# Patient Record
Sex: Female | Born: 1955
Health system: Southern US, Community
[De-identification: ages and names within clinical notes are randomized; demographics above are authoritative.]

## PROBLEM LIST (undated history)

## (undated) ENCOUNTER — Inpatient Hospital Stay: Admission: AD | Payer: Medicare Other | Source: Ambulatory Visit | Admitting: Pulmonary Disease

## (undated) DIAGNOSIS — M8580 Other specified disorders of bone density and structure, unspecified site: Secondary | ICD-10-CM

## (undated) DIAGNOSIS — K0889 Other specified disorders of teeth and supporting structures: Secondary | ICD-10-CM

## (undated) DIAGNOSIS — J189 Pneumonia, unspecified organism: Secondary | ICD-10-CM

## (undated) DIAGNOSIS — J45901 Unspecified asthma with (acute) exacerbation: Secondary | ICD-10-CM

## (undated) DIAGNOSIS — Z21 Asymptomatic human immunodeficiency virus [HIV] infection status: Secondary | ICD-10-CM

## (undated) DIAGNOSIS — I4892 Unspecified atrial flutter: Secondary | ICD-10-CM

## (undated) DIAGNOSIS — E785 Hyperlipidemia, unspecified: Secondary | ICD-10-CM

## (undated) DIAGNOSIS — R5382 Chronic fatigue, unspecified: Secondary | ICD-10-CM

## (undated) DIAGNOSIS — I2510 Atherosclerotic heart disease of native coronary artery without angina pectoris: Secondary | ICD-10-CM

## (undated) DIAGNOSIS — R0600 Dyspnea, unspecified: Secondary | ICD-10-CM

## (undated) DIAGNOSIS — M545 Low back pain, unspecified: Secondary | ICD-10-CM

## (undated) DIAGNOSIS — M1990 Unspecified osteoarthritis, unspecified site: Secondary | ICD-10-CM

## (undated) DIAGNOSIS — K219 Gastro-esophageal reflux disease without esophagitis: Secondary | ICD-10-CM

## (undated) DIAGNOSIS — E559 Vitamin D deficiency, unspecified: Secondary | ICD-10-CM

## (undated) DIAGNOSIS — Z923 Personal history of irradiation: Secondary | ICD-10-CM

## (undated) DIAGNOSIS — D649 Anemia, unspecified: Secondary | ICD-10-CM

## (undated) DIAGNOSIS — R633 Feeding difficulties: Secondary | ICD-10-CM

## (undated) DIAGNOSIS — G9332 Myalgic encephalomyelitis/chronic fatigue syndrome: Secondary | ICD-10-CM

## (undated) DIAGNOSIS — I4891 Unspecified atrial fibrillation: Secondary | ICD-10-CM

## (undated) DIAGNOSIS — E881 Lipodystrophy, not elsewhere classified: Secondary | ICD-10-CM

## (undated) DIAGNOSIS — B20 Human immunodeficiency virus [HIV] disease: Secondary | ICD-10-CM

## (undated) DIAGNOSIS — F419 Anxiety disorder, unspecified: Secondary | ICD-10-CM

## (undated) DIAGNOSIS — N281 Cyst of kidney, acquired: Secondary | ICD-10-CM

## (undated) DIAGNOSIS — R002 Palpitations: Secondary | ICD-10-CM

## (undated) DIAGNOSIS — G4730 Sleep apnea, unspecified: Secondary | ICD-10-CM

## (undated) DIAGNOSIS — R140 Abdominal distension (gaseous): Secondary | ICD-10-CM

## (undated) DIAGNOSIS — R011 Cardiac murmur, unspecified: Secondary | ICD-10-CM

## (undated) DIAGNOSIS — Z9289 Personal history of other medical treatment: Secondary | ICD-10-CM

## (undated) DIAGNOSIS — J45909 Unspecified asthma, uncomplicated: Secondary | ICD-10-CM

## (undated) DIAGNOSIS — E042 Nontoxic multinodular goiter: Secondary | ICD-10-CM

## (undated) DIAGNOSIS — I10 Essential (primary) hypertension: Secondary | ICD-10-CM

## (undated) DIAGNOSIS — J449 Chronic obstructive pulmonary disease, unspecified: Secondary | ICD-10-CM

## (undated) DIAGNOSIS — R600 Localized edema: Secondary | ICD-10-CM

## (undated) DIAGNOSIS — G8929 Other chronic pain: Secondary | ICD-10-CM

## (undated) DIAGNOSIS — Z86711 Personal history of pulmonary embolism: Secondary | ICD-10-CM

## (undated) DIAGNOSIS — E538 Deficiency of other specified B group vitamins: Secondary | ICD-10-CM

## (undated) DIAGNOSIS — B029 Zoster without complications: Secondary | ICD-10-CM

## (undated) HISTORY — DX: Myalgic encephalomyelitis/chronic fatigue syndrome: G93.32

## (undated) HISTORY — PX: TONSILLECTOMY AND ADENOIDECTOMY: SUR1326

## (undated) HISTORY — DX: Other specified disorders of bone density and structure, unspecified site: M85.80

## (undated) HISTORY — DX: Vitamin D deficiency, unspecified: E55.9

## (undated) HISTORY — DX: Palpitations: R00.2

## (undated) HISTORY — DX: Unspecified asthma, uncomplicated: J45.909

## (undated) HISTORY — PX: ABDOMINAL HYSTERECTOMY: SHX81

## (undated) HISTORY — DX: Zoster without complications: B02.9

## (undated) HISTORY — DX: Chronic obstructive pulmonary disease, unspecified: J44.9

## (undated) HISTORY — DX: Deficiency of other specified B group vitamins: E53.8

## (undated) HISTORY — DX: Unspecified atrial fibrillation: I48.91

## (undated) HISTORY — DX: Feeding difficulties: R63.3

## (undated) HISTORY — DX: Localized edema: R60.0

## (undated) HISTORY — PX: BRAIN SURGERY: SHX531

## (undated) HISTORY — DX: Other specified disorders of teeth and supporting structures: K08.89

## (undated) HISTORY — PX: CARDIAC CATHETERIZATION: SHX172

## (undated) HISTORY — DX: Chronic fatigue, unspecified: R53.82

## (undated) HISTORY — DX: Unspecified atrial flutter: I48.92

## (undated) HISTORY — DX: Atherosclerotic heart disease of native coronary artery without angina pectoris: I25.10

## (undated) HISTORY — DX: Personal history of pulmonary embolism: Z86.711

## (undated) HISTORY — DX: Abdominal distension (gaseous): R14.0

## (undated) HISTORY — DX: Dyspnea, unspecified: R06.00

---
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
## 2011-08-25 DIAGNOSIS — I2510 Atherosclerotic heart disease of native coronary artery without angina pectoris: Secondary | ICD-10-CM

## 2011-08-25 HISTORY — DX: Atherosclerotic heart disease of native coronary artery without angina pectoris: I25.10

## 2015-07-09 ENCOUNTER — Emergency Department (HOSPITAL_COMMUNITY): Payer: Medicare Other

## 2015-07-09 ENCOUNTER — Observation Stay (HOSPITAL_COMMUNITY): Payer: Medicare Other

## 2015-07-09 ENCOUNTER — Observation Stay (HOSPITAL_COMMUNITY)
Admission: EM | Admit: 2015-07-09 | Discharge: 2015-07-11 | Disposition: A | Discharge status: Home / Self Care | Payer: Medicare Other | Attending: Internal Medicine | Admitting: Internal Medicine

## 2015-07-09 ENCOUNTER — Encounter (HOSPITAL_COMMUNITY): Payer: Self-pay

## 2015-07-09 ENCOUNTER — Observation Stay (HOSPITAL_BASED_OUTPATIENT_CLINIC_OR_DEPARTMENT_OTHER): Payer: Medicare Other

## 2015-07-09 DIAGNOSIS — J45901 Unspecified asthma with (acute) exacerbation: Secondary | ICD-10-CM | POA: Diagnosis not present

## 2015-07-09 DIAGNOSIS — Z21 Asymptomatic human immunodeficiency virus [HIV] infection status: Secondary | ICD-10-CM | POA: Diagnosis not present

## 2015-07-09 DIAGNOSIS — R0600 Dyspnea, unspecified: Secondary | ICD-10-CM

## 2015-07-09 DIAGNOSIS — R000 Tachycardia, unspecified: Secondary | ICD-10-CM

## 2015-07-09 DIAGNOSIS — E785 Hyperlipidemia, unspecified: Secondary | ICD-10-CM

## 2015-07-09 DIAGNOSIS — J189 Pneumonia, unspecified organism: Secondary | ICD-10-CM

## 2015-07-09 DIAGNOSIS — I10 Essential (primary) hypertension: Secondary | ICD-10-CM

## 2015-07-09 DIAGNOSIS — J4551 Severe persistent asthma with (acute) exacerbation: Secondary | ICD-10-CM | POA: Diagnosis present

## 2015-07-09 DIAGNOSIS — J449 Chronic obstructive pulmonary disease, unspecified: Principal | ICD-10-CM | POA: Insufficient documentation

## 2015-07-09 DIAGNOSIS — J4541 Moderate persistent asthma with (acute) exacerbation: Secondary | ICD-10-CM | POA: Diagnosis not present

## 2015-07-09 DIAGNOSIS — J209 Acute bronchitis, unspecified: Secondary | ICD-10-CM

## 2015-07-09 DIAGNOSIS — K219 Gastro-esophageal reflux disease without esophagitis: Secondary | ICD-10-CM

## 2015-07-09 DIAGNOSIS — Z79899 Other long term (current) drug therapy: Secondary | ICD-10-CM | POA: Insufficient documentation

## 2015-07-09 DIAGNOSIS — R918 Other nonspecific abnormal finding of lung field: Secondary | ICD-10-CM | POA: Insufficient documentation

## 2015-07-09 DIAGNOSIS — Z86711 Personal history of pulmonary embolism: Secondary | ICD-10-CM | POA: Insufficient documentation

## 2015-07-09 DIAGNOSIS — R9389 Abnormal findings on diagnostic imaging of other specified body structures: Secondary | ICD-10-CM

## 2015-07-09 DIAGNOSIS — B20 Human immunodeficiency virus [HIV] disease: Secondary | ICD-10-CM | POA: Diagnosis present

## 2015-07-09 DIAGNOSIS — R0902 Hypoxemia: Secondary | ICD-10-CM

## 2015-07-09 HISTORY — DX: Other chronic pain: G89.29

## 2015-07-09 HISTORY — DX: Pneumonia, unspecified organism: J18.9

## 2015-07-09 HISTORY — DX: Essential (primary) hypertension: I10

## 2015-07-09 HISTORY — DX: Personal history of pulmonary embolism: Z86.711

## 2015-07-09 HISTORY — DX: Anxiety disorder, unspecified: F41.9

## 2015-07-09 HISTORY — DX: Low back pain, unspecified: M54.50

## 2015-07-09 HISTORY — DX: Low back pain: M54.5

## 2015-07-09 HISTORY — DX: Cardiac murmur, unspecified: R01.1

## 2015-07-09 HISTORY — DX: Hyperlipidemia, unspecified: E78.5

## 2015-07-09 HISTORY — DX: Asymptomatic human immunodeficiency virus (hiv) infection status: Z21

## 2015-07-09 HISTORY — DX: Gastro-esophageal reflux disease without esophagitis: K21.9

## 2015-07-09 HISTORY — DX: Unspecified osteoarthritis, unspecified site: M19.90

## 2015-07-09 HISTORY — DX: Unspecified asthma with (acute) exacerbation: J45.901

## 2015-07-09 HISTORY — DX: Anemia, unspecified: D64.9

## 2015-07-09 HISTORY — DX: Cyst of kidney, acquired: N28.1

## 2015-07-09 HISTORY — DX: Human immunodeficiency virus (HIV) disease: B20

## 2015-07-09 HISTORY — DX: Sleep apnea, unspecified: G47.30

## 2015-07-09 HISTORY — DX: Personal history of other medical treatment: Z92.89

## 2015-07-09 LAB — CBC WITH DIFFERENTIAL/PLATELET
Basophils Absolute: 0 10*3/uL (ref 0.0–0.1)
Basophils Relative: 0 %
EOS ABS: 0.2 10*3/uL (ref 0.0–0.7)
EOS PCT: 2 %
HCT: 40.2 % (ref 36.0–46.0)
Hemoglobin: 12.9 g/dL (ref 12.0–15.0)
LYMPHS ABS: 1.9 10*3/uL (ref 0.7–4.0)
LYMPHS PCT: 25 %
MCH: 29.1 pg (ref 26.0–34.0)
MCHC: 32.1 g/dL (ref 30.0–36.0)
MCV: 90.7 fL (ref 78.0–100.0)
MONO ABS: 0.5 10*3/uL (ref 0.1–1.0)
Monocytes Relative: 6 %
Neutro Abs: 5.2 10*3/uL (ref 1.7–7.7)
Neutrophils Relative %: 67 %
PLATELETS: 175 10*3/uL (ref 150–400)
RBC: 4.43 MIL/uL (ref 3.87–5.11)
RDW: 13.5 % (ref 11.5–15.5)
WBC: 7.7 10*3/uL (ref 4.0–10.5)

## 2015-07-09 LAB — BASIC METABOLIC PANEL
Anion gap: 7 (ref 5–15)
BUN: 14 mg/dL (ref 6–20)
CALCIUM: 10.3 mg/dL (ref 8.9–10.3)
CO2: 31 mmol/L (ref 22–32)
CREATININE: 1.02 mg/dL — AB (ref 0.44–1.00)
Chloride: 100 mmol/L — ABNORMAL LOW (ref 101–111)
GFR calc Af Amer: >60 mL/min (ref >60)
GFR, EST NON AFRICAN AMERICAN: 58 mL/min — AB (ref >60)
GLUCOSE: 147 mg/dL — AB (ref 65–99)
Potassium: 3.5 mmol/L (ref 3.5–5.1)
SODIUM: 138 mmol/L (ref 135–145)

## 2015-07-09 LAB — PROCALCITONIN: Procalcitonin: <0.1 ng/mL

## 2015-07-09 LAB — LACTATE DEHYDROGENASE: LDH: 175 U/L (ref 98–192)

## 2015-07-09 MED ORDER — ACETAMINOPHEN 325 MG PO TABS
650.0000 mg | ORAL_TABLET | Freq: Four times a day (QID) | ORAL | Status: DC | PRN
  Filled 2015-07-09: qty 2

## 2015-07-09 MED ORDER — ALBUTEROL SULFATE (2.5 MG/3ML) 0.083% IN NEBU
5.0000 mg | INHALATION_SOLUTION | Freq: Once | RESPIRATORY_TRACT | Status: AC
  Administered 2015-07-09: 5 mg via RESPIRATORY_TRACT
  Filled 2015-07-09: qty 6

## 2015-07-09 MED ORDER — SODIUM CHLORIDE 0.9 % IV BOLUS (SEPSIS)
1000.0000 mL | Freq: Once | INTRAVENOUS | Status: AC
  Administered 2015-07-09: 1000 mL via INTRAVENOUS

## 2015-07-09 MED ORDER — RALTEGRAVIR POTASSIUM 400 MG PO TABS
400.0000 mg | ORAL_TABLET | Freq: Two times a day (BID) | ORAL | Status: DC
  Administered 2015-07-09 – 2015-07-11 (×4): 400 mg via ORAL
  Filled 2015-07-09 (×6): qty 1

## 2015-07-09 MED ORDER — ACETAMINOPHEN 650 MG RE SUPP: 650.0000 mg | Freq: Four times a day (QID) | RECTAL | Status: DC | PRN

## 2015-07-09 MED ORDER — ALBUTEROL SULFATE (2.5 MG/3ML) 0.083% IN NEBU: 2.5000 mg | INHALATION_SOLUTION | RESPIRATORY_TRACT | Status: DC | PRN

## 2015-07-09 MED ORDER — CEFTRIAXONE SODIUM 1 G IJ SOLR
1.0000 g | Freq: Once | INTRAMUSCULAR | Status: AC
  Administered 2015-07-09: 1 g via INTRAVENOUS
  Filled 2015-07-09: qty 10

## 2015-07-09 MED ORDER — ADULT MULTIVITAMIN W/MINERALS CH
1.0000 | ORAL_TABLET | Freq: Every day | ORAL | Status: DC
  Administered 2015-07-09 – 2015-07-11 (×3): 1 via ORAL
  Filled 2015-07-09 (×3): qty 1

## 2015-07-09 MED ORDER — DEXTROSE 5 % IV SOLN
500.0000 mg | INTRAVENOUS | Status: DC
  Administered 2015-07-10 – 2015-07-11 (×2): 500 mg via INTRAVENOUS
  Filled 2015-07-09 (×2): qty 500

## 2015-07-09 MED ORDER — IPRATROPIUM BROMIDE 0.02 % IN SOLN
0.5000 mg | Freq: Once | RESPIRATORY_TRACT | Status: AC
  Administered 2015-07-09: 0.5 mg via RESPIRATORY_TRACT
  Filled 2015-07-09: qty 2.5

## 2015-07-09 MED ORDER — ENOXAPARIN SODIUM 40 MG/0.4ML ~~LOC~~ SOLN
40.0000 mg | SUBCUTANEOUS | Status: DC
Start: 2015-07-09 — End: 2015-07-11
  Administered 2015-07-09 – 2015-07-11 (×3): 40 mg via SUBCUTANEOUS
  Filled 2015-07-09 (×3): qty 0.4

## 2015-07-09 MED ORDER — IOHEXOL 350 MG/ML SOLN: 100.0000 mL | Freq: Once | INTRAVENOUS | Status: DC | PRN

## 2015-07-09 MED ORDER — DEXTROSE 5 % IV SOLN
1.0000 g | INTRAVENOUS | Status: DC
  Administered 2015-07-10 – 2015-07-11 (×2): 1 g via INTRAVENOUS
  Filled 2015-07-09 (×2): qty 10

## 2015-07-09 MED ORDER — IPRATROPIUM-ALBUTEROL 0.5-2.5 (3) MG/3ML IN SOLN
3.0000 mL | RESPIRATORY_TRACT | Status: DC
  Filled 2015-07-09: qty 3

## 2015-07-09 MED ORDER — LOSARTAN POTASSIUM 50 MG PO TABS
100.0000 mg | ORAL_TABLET | Freq: Every day | ORAL | Status: DC
  Administered 2015-07-09 – 2015-07-11 (×3): 100 mg via ORAL
  Filled 2015-07-09 (×3): qty 2

## 2015-07-09 MED ORDER — RALTEGRAVIR POTASSIUM 400 MG PO TABS
400.0000 mg | ORAL_TABLET | Freq: Every day | ORAL | Status: DC
  Administered 2015-07-09: 400 mg via ORAL
  Filled 2015-07-09: qty 1

## 2015-07-09 MED ORDER — DARUNAVIR ETHANOLATE 800 MG PO TABS
800.0000 mg | ORAL_TABLET | Freq: Every day | ORAL | Status: DC
  Administered 2015-07-09 – 2015-07-11 (×3): 800 mg via ORAL
  Filled 2015-07-09 (×3): qty 1

## 2015-07-09 MED ORDER — ENSURE ENLIVE PO LIQD: 237.0000 mL | Freq: Two times a day (BID) | ORAL | Status: DC

## 2015-07-09 MED ORDER — PANTOPRAZOLE SODIUM 40 MG PO TBEC
40.0000 mg | DELAYED_RELEASE_TABLET | Freq: Every day | ORAL | Status: DC
  Administered 2015-07-09 – 2015-07-11 (×3): 40 mg via ORAL
  Filled 2015-07-09 (×3): qty 1

## 2015-07-09 MED ORDER — CETYLPYRIDINIUM CHLORIDE 0.05 % MT LIQD
7.0000 mL | Freq: Two times a day (BID) | OROMUCOSAL | Status: DC
  Administered 2015-07-09 – 2015-07-11 (×4): 7 mL via OROMUCOSAL

## 2015-07-09 MED ORDER — EZETIMIBE 10 MG PO TABS
10.0000 mg | ORAL_TABLET | Freq: Every day | ORAL | Status: DC
  Administered 2015-07-09 – 2015-07-11 (×3): 10 mg via ORAL
  Filled 2015-07-09 (×3): qty 1

## 2015-07-09 MED ORDER — SODIUM CHLORIDE 0.9 % IV SOLN
INTRAVENOUS | Status: AC
  Administered 2015-07-09: 21:00:00 via INTRAVENOUS

## 2015-07-09 MED ORDER — IPRATROPIUM-ALBUTEROL 0.5-2.5 (3) MG/3ML IN SOLN
3.0000 mL | RESPIRATORY_TRACT | Status: DC
  Administered 2015-07-09 – 2015-07-11 (×9): 3 mL via RESPIRATORY_TRACT
  Filled 2015-07-09 (×12): qty 3

## 2015-07-09 MED ORDER — DEXTROSE 5 % IV SOLN
500.0000 mg | Freq: Once | INTRAVENOUS | Status: AC
  Administered 2015-07-09: 500 mg via INTRAVENOUS
  Filled 2015-07-09: qty 500

## 2015-07-09 MED ORDER — DM-GUAIFENESIN ER 30-600 MG PO TB12
1.0000 | ORAL_TABLET | Freq: Two times a day (BID) | ORAL | Status: DC
  Administered 2015-07-09 – 2015-07-11 (×5): 1 via ORAL
  Filled 2015-07-09 (×5): qty 1

## 2015-07-09 MED ORDER — METHYLPREDNISOLONE SODIUM SUCC 125 MG IJ SOLR
125.0000 mg | Freq: Once | INTRAMUSCULAR | Status: AC
  Administered 2015-07-09: 125 mg via INTRAVENOUS
  Filled 2015-07-09: qty 2

## 2015-07-09 MED ORDER — RITONAVIR 100 MG PO TABS
100.0000 mg | ORAL_TABLET | Freq: Every day | ORAL | Status: DC
  Administered 2015-07-09 – 2015-07-11 (×3): 100 mg via ORAL
  Filled 2015-07-09 (×3): qty 1

## 2015-07-09 MED ORDER — ALPRAZOLAM 0.5 MG PO TABS
0.5000 mg | ORAL_TABLET | Freq: Every evening | ORAL | Status: DC | PRN
  Administered 2015-07-09 – 2015-07-10 (×2): 0.5 mg via ORAL
  Filled 2015-07-09 (×2): qty 1

## 2015-07-09 MED ORDER — IPRATROPIUM-ALBUTEROL 0.5-2.5 (3) MG/3ML IN SOLN: 3.0000 mL | Freq: Four times a day (QID) | RESPIRATORY_TRACT | Status: DC

## 2015-07-09 NOTE — Progress Notes (Signed)
*  Preliminary Results* Bilateral lower extremity venous duplex completed. Bilateral lower extremities are negative for deep vein thrombosis. There is no evidence of Baker's cyst bilaterally.  07/09/2015  Maudry Mayhew, RVT, RDCS, RDMS

## 2015-07-09 NOTE — ED Notes (Signed)
Pt c/o asthma, has been coughing up green stuff for several days, chest is tight, lungs very diminished and wheezing.

## 2015-07-09 NOTE — Care Management Note (Addendum)
Case Management Note  Patient Details  Name: Shannon [name] MRN: [id number] Date of Birth: 22-Aug-1956  Subjective/Objective:                  Date-07-09-15 Initial Assessment Spoke with patient at the bedside. Introduced self as Tourist information centre manager and explained role in discharge planning and how to be reached.  Verified patient lives at [address] apt [place] [address] Verified patient anticipates to go home alone, at time of discharge.  Patient has DME nebulizer she left in [place]. Expressed potential need for no other DME.  Patient denied  needing help with their medication. Patient has U[employer] Medicare and Medicaid from [place]. Patient takes the bus to MD appointments.  Verified patient has PCP none local. Follow up with [facility]. Patient states they currently receive [facility] services through no one.    Plan: CM will continue to follow for discharge planning and [facility] resources.   Carles Collet RN BSN CM [phone]   Action/Plan:  Referral made to [facility] for nebulizer and walker.   Expected Discharge Date:                  Expected Discharge Plan:  Home/Self Care  In-House Referral:     Discharge planning Services  CM Consult  Post Acute Care Choice:    Choice offered to:     DME Arranged:    DME Agency:     HH Arranged:    HH Agency:     Status of Service:  In process, will continue to follow  Medicare Important Message Given:    Date Medicare IM Given:    Medicare IM give by:    Date Additional Medicare IM Given:    Additional Medicare Important Message give by:     If discussed at [place] of Stay Meetings, dates discussed:    Additional Comments:  Carles Collet, RN 07/09/2015, 1:51 PM

## 2015-07-09 NOTE — H&P (Signed)
Date: 07/09/2015               Patient Name:  Shannon [name] MRN: [id number]  DOB: October 05, 1955 Age / Sex: 59 y.o., female   PCP: No primary care provider on file.           Medical Service: Internal Medicine Teaching Service         Attending Physician: Dr. Aldine Contes, MD    First Contact: Dr. Genene Churn, MD Pager: [phone]  Second Contact: Dr. Marlowe Sax, MD Pager: [phone]       After Hours (After 5p/  First Contact Pager: [phone]  weekends / holidays): Second Contact Pager: [phone]    Most Recent Discharge Date:     Chief Complaint:  Chief Complaint  Patient presents with  . Shortness of Breath  . Asthma       History of Present Illness:  Shannon [name] is a 59 y.o. femalewho has a PMH of well controlled HIV on ART (12/2014: CD4 506/viral load <20 diagnosed in [place] ~ 17 yrs ago), asthma, PE, HTN, HLD, "mild diastolic dysfunction".    She presents to the ED with worsening dyspnea for the past couple of weeks.  She moved from [place] about 2 months ago to avoid the humidity which she said worsens her asthma.  She also has family in [place].  She has never been a smoker but was diagnosed with asthma in her 18s and using proair.  She ran out of her nebulizer meds and has been using her proair about 4-6 times a day.  Prior to a couple of weeks ago she was only using the proair twice daily.  She also notes wheezing but denies chest tightness.  She reports sick contacts and states her asthma tends to be triggered by cold air.  She also endorses some productive cough of greenish sputum, fever/chills, and increased blood pressure.  She had one episode of chest pain last PM that lasted a few seconds which she attributed to "gas."  She reports having a LHC last year that was normal.  She also provided records from a cardiologist with a TTE done in May 2016 that showed a normal LVEF with "mild diastolic dysfunction."  Left and right atrium moderately dilated.  PA pressures were normal.    In the  ED, pt was hypoxic in the 80s on RA and was tachycardic.  She was not febrile and without leukocytosis.  She has decreased air movement and expiratory wheezing throughout.  EKG showed sinus tachycardia.  CXR showed a possible RML opacity representing atx and/or acute infiltrate.  But felt opacity felt to be mostly atx.  She was admitted for CAP and given duonebs, solumedrol and started on ceftriaxone and azithromycin for CAP.     Meds: Current Facility-Administered Medications  Medication Dose Route Frequency Provider Last Rate Last Dose  . acetaminophen (TYLENOL) tablet 650 mg  650 mg Oral Q6H PRN Jones Bales, MD       Or  . acetaminophen (TYLENOL) suppository 650 mg  650 mg Rectal Q6H PRN Jones Bales, MD      . albuterol (PROVENTIL) (2.5 MG/3ML) 0.083% nebulizer solution 2.5 mg  2.5 mg Nebulization Q2H PRN Jones Bales, MD      . Darunavir Ethanolate (PREZISTA) tablet 800 mg  800 mg Oral Q breakfast Jones Bales, MD      . dextromethorphan-guaiFENesin (MUCINEX DM) 30-600 MG per 12 hr tablet 1 tablet  1 tablet Oral BID [place] Parco S  Gordy Levan, MD      . enoxaparin (LOVENOX) injection 40 mg  40 mg Subcutaneous Q24H Jones Bales, MD      . ezetimibe (ZETIA) tablet 10 mg  10 mg Oral q1800 Jones Bales, MD      . ipratropium-albuterol (DUONEB) 0.5-2.5 (3) MG/3ML nebulizer solution 3 mL  3 mL Nebulization Q4H Jones Bales, MD      . losartan (COZAAR) tablet 100 mg  100 mg Oral Daily Jones Bales, MD      . multivitamin with minerals tablet 1 tablet  1 tablet Oral Daily Jones Bales, MD      . pantoprazole (PROTONIX) EC tablet 40 mg  40 mg Oral Daily Jones Bales, MD      . raltegravir (ISENTRESS) tablet 400 mg  400 mg Oral Daily Jones Bales, MD      . ritonavir (NORVIR) tablet 100 mg  100 mg Oral Q breakfast Jones Bales, MD        Prescriptions prior to admission  Medication Sig Dispense Refill Last Dose  . albuterol (PROAIR HFA) 108 (90 BASE) MCG/ACT  inhaler Inhale 2 puffs into the lungs every 6 (six) hours as needed for wheezing or shortness of breath.   07/08/2015 at Unknown time  . albuterol (PROVENTIL) (2.5 MG/3ML) 0.083% nebulizer solution Take 2.5 mg by nebulization every 6 (six) hours as needed for wheezing or shortness of breath.   unk  . Coenzyme Q10 (COQ10 PO) Take 1 tablet by mouth daily.   07/08/2015 at Unknown time  . Cyanocobalamin (VITAMIN B-12 IJ) Inject 1,000 mcg as directed every 30 (thirty) days.   9/16  . Darunavir Ethanolate (PREZISTA) 800 MG tablet Take 800 mg by mouth daily with breakfast.   07/08/2015 at Unknown time  . ezetimibe (ZETIA) 10 MG tablet Take 10 mg by mouth daily.   07/08/2015 at Unknown time  . losartan (COZAAR) 100 MG tablet Take 100 mg by mouth daily.   07/08/2015 at Unknown time  . Multiple Vitamin (MULTIVITAMIN WITH MINERALS) TABS tablet Take 1 tablet by mouth daily.   07/08/2015 at Unknown time  . Multiple Vitamins-Minerals (ZINC PO) Take 1 tablet by mouth daily.   07/08/2015 at Unknown time  . NIFEdipine (PROCARDIA) 10 MG capsule Take 10 mg by mouth daily.   07/08/2015 at Unknown time  . omeprazole (PRILOSEC) 40 MG capsule Take 40 mg by mouth daily.   07/08/2015 at Unknown time  . POTASSIUM PO Take 1 tablet by mouth daily.   07/08/2015 at Unknown time  . raltegravir (ISENTRESS) 400 MG tablet Take 400 mg by mouth daily.   07/08/2015 at Unknown time  . ritonavir (NORVIR) 100 MG TABS tablet Take 100 mg by mouth daily with breakfast.   07/08/2015 at Unknown time  . vitamin C (ASCORBIC ACID) 500 MG tablet Take 1,000 mg by mouth daily.   07/08/2015 at Unknown time    Allergies: Allergies as of 07/09/2015 - Review Complete 07/09/2015  Allergen Reaction Noted  . Ciprofloxacin Hives 07/09/2015    PMH: Past Medical History  Diagnosis Date  . HIV antibody positive ([place])   . Asthma   . Hypertension     PSH: Past Surgical History  Procedure Laterality Date  . Abdominal hysterectomy    . Cardiac  catheterization    . Brain surgery      tumor on brain    FH: History reviewed. No pertinent family history.  SH: Social History  Substance  Use Topics  . Smoking status: Never Smoker   . Smokeless tobacco: Not on file  . Alcohol Use: No    Review of Systems: Pertinent items are noted in HPI.  Physical Exam: BP 144/84 mmHg  Pulse 108  Temp(Src) 98.4 F (36.9 C) (Oral)  Resp 16  Ht 5' 3.5" (1.613 m)  Wt 210 lb (95.255 kg)  BMI 36.61 kg/m2  SpO2 94%  Physical Exam  Constitutional: She is oriented to person, place, and time and well-developed, well-nourished, and in no distress.  HENT:  Head: Normocephalic and atraumatic.  Eyes: Conjunctivae and EOM are normal.  Neck: Neck supple.  Cardiovascular: Regular rhythm.  Tachycardia present.  Exam reveals no gallop and no friction rub.   Murmur heard. Pulmonary/Chest: Effort normal. No accessory muscle usage. No tachypnea. No respiratory distress. She has decreased breath sounds. She has wheezes. She has no rhonchi. She has no rales.  Abdominal: Soft. Bowel sounds are normal. There is no tenderness.  Neurological: She is alert and oriented to person, place, and time.  Skin: Skin is warm and dry. No rash noted.  Psychiatric: Affect normal.    Lab results:  Basic Metabolic Panel:  Recent Labs  07/09/15 0655  NA 138  K 3.5  CL 100*  CO2 31  GLUCOSE 147*  BUN 14  CREATININE 1.02*  CALCIUM 10.3    Calcium/Magnesium/Phosphorus:  Recent Labs Lab 07/09/15 0655  CALCIUM 10.3    Liver Function Tests: No results for input(s): AST, ALT, ALKPHOS, BILITOT, PROT, ALBUMIN in the last 72 hours. No results for input(s): LIPASE, AMYLASE in the last 72 hours. No results for input(s): AMMONIA in the last 72 hours.  CBC: Lab Results  Component Value Date   WBC 7.7 07/09/2015   HGB 12.9 07/09/2015   HCT 40.2 07/09/2015   MCV 90.7 07/09/2015   PLT 175 07/09/2015    Lipase: No results found for: LIPASE  Lactic  Acid/Procalcitonin:  Recent Labs Lab 07/09/15 1005  PROCALCITON <0.10    Cardiac Enzymes: No results for input(s): TROPIPOC in the last 72 hours. No results found for: CKTOTAL, CKMB, CKMBINDEX, TROPONINI  BNP: No results for input(s): PROBNP in the last 72 hours.  D-Dimer: No results for input(s): DDIMER in the last 72 hours.  CBG: No results for input(s): GLUCAP in the last 72 hours.  Hemoglobin A1C: No results for input(s): HGBA1C in the last 72 hours.  Lipid Panel: No results for input(s): CHOL, HDL, LDLCALC, TRIG, CHOLHDL, LDLDIRECT in the last 72 hours.  Thyroid Function Tests: No results for input(s): TSH, T4TOTAL, FREET4, T3FREE, THYROIDAB in the last 72 hours.  Anemia Panel: No results for input(s): VITAMINB12, FOLATE, FERRITIN, TIBC, IRON, RETICCTPCT in the last 72 hours.  Coagulation: No results for input(s): LABPROT, INR in the last 72 hours.  Urine Drug Screen: Drugs of Abuse:  No results found for: LABOPIA, COCAINSCRNUR, LABBENZ, AMPHETMU, THCU, LABBARB  Alcohol Level: No results for input(s): ETH in the last 72 hours.  Urinalysis: No results found for: COLORURINE, APPEARANCEUR, LABSPEC, PHURINE, GLUCOSEU, HGBUR, BILIRUBINUR, KETONESUR, PROTEINUR, UROBILINOGEN, NITRITE, LEUKOCYTESUR  Imaging results:  Dg Chest 2 View  07/09/2015  CLINICAL DATA:  Cough and asthma. EXAM: CHEST - 2 VIEW COMPARISON:  None FINDINGS: The heart size and mediastinal contours are within normal limits. Opacity in the right middle lobe appears to be mostly atelectasis. However, adjacent opacity does suggests that this could also represent an acute infiltrate. There is no evidence of pulmonary edema, pneumothorax, nodule  or pleural fluid. The visualized skeletal structures are unremarkable. IMPRESSION: Right middle lobe opacity representing atelectasis and/or acute infiltrate. Electronically Signed   By: Aletta Edouard M.D.   On: 07/09/2015 08:07    EKG: EKG  Interpretation  Date/Time:  Tuesday July 09 2015 06:37:24 EST Ventricular Rate:  112 PR Interval:  171 QRS Duration: 72 QT Interval:  328 QTC Calculation: 448 R Axis:   -35 Text Interpretation:  Sinus tachycardia Probable left atrial enlargement Left axis deviation No old tracing to compare Confirmed by [facility]  MD, DAVID ([id number]) on 07/09/2015 6:40:03 AM   Antibiotics: Antibiotics Given (last 72 hours)    None      Anti-infectives    Start     Dose/Rate Route Frequency Ordered Stop   07/09/15 1200  Darunavir Ethanolate (PREZISTA) tablet 800 mg     800 mg Oral Daily with breakfast 07/09/15 1043     07/09/15 1200  raltegravir (ISENTRESS) tablet 400 mg     400 mg Oral Daily 07/09/15 1043     07/09/15 1200  ritonavir (NORVIR) tablet 100 mg     100 mg Oral Daily with breakfast 07/09/15 1043     07/09/15 0830  cefTRIAXone (ROCEPHIN) 1 g in dextrose 5 % 50 mL IVPB     1 g 100 mL/hr over 30 Minutes Intravenous  Once 07/09/15 0826 07/09/15 0948   07/09/15 0830  azithromycin (ZITHROMAX) 500 mg in dextrose 5 % 250 mL IVPB     500 mg 250 mL/hr over 60 Minutes Intravenous  Once 07/09/15 0826 07/09/15 1047     Consults:   Assessment & Plan by Problem: Principal Problem:   Dyspnea Active Problems:   History of pulmonary embolism   Asthma   HIV disease ([place])   Essential hypertension   Mild diastolic dysfunction  Acute dyspnea Pt reports increased SOB, cough, and greenish sputum production.  O2 sats in the ED were 83-84% on RA.  Does have a h/o PE and was on coumadin previously so acute PE cannot be excluded.  She also has other risk factors for PE including travel from [place] about 2 months ago.  Other possibilities include asthma exacerbation triggered by infection and she has been out of her nebulizer.  States she doesn't do well with prednisone and that it makes her hyper.  CXR reveals possible PNA but afebrile and no leukocytosis and could easily represent atx.  Wells score: 3  so moderate risk.  Does appear that the right leg is slightly larger than the left.  Also could be PCP PNA but doubt since previous CD4 count was 506.  She also had a recent TTE in May 2016 with an EF of 70% and "mild diastolic dysfunction."  She does not appear volume overloaded.  Also with mild aortic stenosis and mild LVH.  No regional wall motion abnormalities.  Normal PA pressures.   -check PCT -LE dopplers  -consider CTA chest given prior h/o PE -cont abx for now pending PCT  -duonebs q4h, albuterol q2h PRN -consider prednisone in AM (patient states it maker her hyper)-->received '125mg'$  solumedrol in ED  -O2 [place] to keep O2sat>92%   -continuous pulse oximetry -monitor on telemetry given tachycardia -check pulse ox with ambulation  -PT/OT -IS -ABG if decompensates  Asthma -per above   HIV disease Pt previous CD4/viral load in May 2016 was 506/<20. -CD4, viral load -cont ART -would like to connect and be followed by ID so will need referral upon d/c  Hypertension  Stable.  -cont home meds   GERD  Pt on chronic PPI at home. -protonix  Dyslipidemia  -cont statin therapy  FEN  Fluids-None Electrolytes-Replete PRN Nutrition- Heart healthy  VTE prophylaxis  lovenox '40mg'$  SQ qd  Disposition Disposition deferred at this time, awaiting improvement of current medical problems. Anticipated discharge in approximately 1-2 day(s).    Emergency Contact Contact Information    Name Relation Home Work [place] Niece [phone]        The patient does not have a current PCP (No primary care provider on file.) and does need an [facility] hospital follow-up appointment after discharge.  We will get her a follow-up appt in our clinic upon discharge.   Signed Jones Bales, MD PGY-3, Internal Medicine Teaching Service 07/09/2015, 1:14 PM

## 2015-07-09 NOTE — ED Notes (Signed)
Patient transported to X-ray 

## 2015-07-09 NOTE — Progress Notes (Signed)
Arrived to restart PIV for CT exam. Patient refused start and asked to speak with MD. Primary RN aware,.

## 2015-07-09 NOTE — ED Provider Notes (Signed)
CSN: [id number]     Arrival date & time 07/09/15  4098 History   First MD Initiated Contact with Patient 07/09/15 [phone]     Chief Complaint  Patient presents with  . Shortness of Breath  . Asthma     (Consider location/radiation/quality/duration/timing/severity/associated sxs/prior Treatment) HPI Comments: Patient with PMH of HIV, asthma, HTN, and COPD presents to the ED with a chief complaint of wheezing, SOB, and productive cough.  She states that her symptoms have been worsening for the past 3 weeks.  She states that she normally uses a nebulizer, but is new to the area and does not have one.  She has been compliant with her HIV meds, CD4 count >500, viral load undetectable (as of May 2016).  She denies any associated fevers, chills, chest pain.  She thinks that her symptoms are exacerbated by the weather.  She recently moved to [place] from [place], and does not have a PCP.  The history is provided by the patient. No language interpreter was used.    Past Medical History  Diagnosis Date  . HIV antibody positive ([place])   . Asthma   . Hypertension   . COPD (chronic obstructive pulmonary disease) [facility])    Past Surgical History  Procedure Laterality Date  . Abdominal hysterectomy    . Cardiac catheterization    . Brain surgery      tumor on brain   No family history on file. Social History  Substance Use Topics  . Smoking status: Never Smoker   . Smokeless tobacco: None  . Alcohol Use: No   OB History    No data available     Review of Systems  Constitutional: Negative for fever and chills.  Respiratory: Positive for cough and shortness of breath.   Cardiovascular: Negative for chest pain.  Gastrointestinal: Negative for nausea, vomiting, diarrhea and constipation.  Genitourinary: Negative for dysuria.  All other systems reviewed and are negative.     Allergies  Ciprofloxacin  Home Medications   Prior to Admission medications   Not on File   BP 167/98  mmHg  Pulse 107  Temp(Src) 98.3 F (36.8 C) (Oral)  Resp 16  Ht '5\' 3"'$  (1.6 m)  Wt 210 lb (95.255 kg)  BMI 37.21 kg/m2  SpO2 97% Physical Exam  Constitutional: She is oriented to person, place, and time. She appears well-developed and well-nourished.  HENT:  Head: Normocephalic and atraumatic.  Eyes: Conjunctivae and EOM are normal. Pupils are equal, round, and reactive to light.  Neck: Normal range of motion. Neck supple.  Cardiovascular: Regular rhythm.  Exam reveals no gallop and no friction rub.   No murmur heard. Mildly tachycardic  Pulmonary/Chest: Effort normal. No respiratory distress. She has wheezes. She has no rales. She exhibits no tenderness.  Mild wheezing, diminished lung sounds  Abdominal: Soft. Bowel sounds are normal. She exhibits no distension and no mass. There is no tenderness. There is no rebound and no guarding.  Musculoskeletal: Normal range of motion. She exhibits no edema or tenderness.  Neurological: She is alert and oriented to person, place, and time.  Skin: Skin is warm and dry.  Psychiatric: She has a normal mood and affect. Her behavior is normal. Judgment and thought content normal.  Nursing note and vitals reviewed.   ED Course  Procedures (including critical care time) Labs Review Results for orders placed or performed during the hospital encounter of 07/09/15  CBC with Differential/Platelet  Result Value Ref Range   WBC 7.7  4.0 - 10.5 K/uL   RBC 4.43 3.87 - 5.11 MIL/uL   Hemoglobin 12.9 12.0 - 15.0 g/dL   HCT 40.2 36.0 - 46.0 %   MCV 90.7 78.0 - 100.0 fL   MCH 29.1 26.0 - 34.0 pg   MCHC 32.1 30.0 - 36.0 g/dL   RDW 13.5 11.5 - 15.5 %   Platelets 175 150 - 400 K/uL   Neutrophils Relative % 67 %   Neutro Abs 5.2 1.7 - 7.7 K/uL   Lymphocytes Relative 25 %   Lymphs Abs 1.9 0.7 - 4.0 K/uL   Monocytes Relative 6 %   Monocytes Absolute 0.5 0.1 - 1.0 K/uL   Eosinophils Relative 2 %   Eosinophils Absolute 0.2 0.0 - 0.7 K/uL   Basophils  Relative 0 %   Basophils Absolute 0.0 0.0 - 0.1 K/uL  Basic metabolic panel  Result Value Ref Range   Sodium 138 135 - 145 mmol/L   Potassium 3.5 3.5 - 5.1 mmol/L   Chloride 100 (L) 101 - 111 mmol/L   CO2 31 22 - 32 mmol/L   Glucose, Bld 147 (H) 65 - 99 mg/dL   BUN 14 6 - 20 mg/dL   Creatinine, Ser 1.02 (H) 0.44 - 1.00 mg/dL   Calcium 10.3 8.9 - 10.3 mg/dL   GFR calc non Af Amer 58 (L) >60 mL/min   GFR calc Af Amer >60 >60 mL/min   Anion gap 7 5 - 15   Dg Chest 2 View  07/09/2015  CLINICAL DATA:  Cough and asthma. EXAM: CHEST - 2 VIEW COMPARISON:  None FINDINGS: The heart size and mediastinal contours are within normal limits. Opacity in the right middle lobe appears to be mostly atelectasis. However, adjacent opacity does suggests that this could also represent an acute infiltrate. There is no evidence of pulmonary edema, pneumothorax, nodule or pleural fluid. The visualized skeletal structures are unremarkable. IMPRESSION: Right middle lobe opacity representing atelectasis and/or acute infiltrate. Electronically Signed   By: Aletta Edouard M.D.   On: 07/09/2015 08:07    I have personally reviewed and evaluated these images and lab results as part of my medical decision-making.   EKG Interpretation   Date/Time:  Tuesday July 09 2015 06:37:24 EST Ventricular Rate:  112 PR Interval:  171 QRS Duration: 72 QT Interval:  328 QTC Calculation: 448 R Axis:   -35 Text Interpretation:  Sinus tachycardia Probable left atrial enlargement  Left axis deviation No old tracing to compare Confirmed by [facility]  MD,  DAVID ([id number]) on 07/09/2015 6:40:03 AM      MDM   Final diagnoses:  CAP (community acquired pneumonia)  Hypoxia    Patient with cough, asthma, COPD and HIV.  Will check labs and CXR.  Give breathing treatment and solumedrol.  Will reassess.  Chest x-ray remarkable for right middle lobe opacity. Consistent with pneumonia given cough and shortness of breath. Patient noted  to be hypoxic on room air. Oxygen saturation is 83-88%. Patient placed on 2 L nasal cannula. Patient will need to be admitted for pneumonia with hypoxia. Patient started on Rocephin and azithromycin for CAP. Blood cultures pending. Patient does not have a primary care provider.  Patient discussed with Dr. [name], who agrees with the plan.  Internal medicine to admit.   Montine Circle, PA-C 07/09/15 [place], MD 07/10/15 [phone]

## 2015-07-10 LAB — HEPATITIS PANEL, ACUTE
HCV Ab: <0.1 s/co ratio (ref 0.0–0.9)
HEP B C IGM: NEGATIVE
Hep A IgM: NEGATIVE
Hepatitis B Surface Ag: NEGATIVE

## 2015-07-10 LAB — T-HELPER CELLS (CD4) COUNT (NOT AT ARMC)
CD4 T CELL HELPER: 20 % — AB (ref 33–55)
CD4 T Cell Abs: 140 /uL — ABNORMAL LOW (ref 400–2700)

## 2015-07-10 LAB — BASIC METABOLIC PANEL
ANION GAP: 7 (ref 5–15)
BUN: 18 mg/dL (ref 6–20)
CHLORIDE: 102 mmol/L (ref 101–111)
CO2: 30 mmol/L (ref 22–32)
Calcium: 10.2 mg/dL (ref 8.9–10.3)
Creatinine, Ser: 1.07 mg/dL — ABNORMAL HIGH (ref 0.44–1.00)
GFR calc Af Amer: >60 mL/min (ref >60)
GFR, EST NON AFRICAN AMERICAN: 55 mL/min — AB (ref >60)
GLUCOSE: 172 mg/dL — AB (ref 65–99)
POTASSIUM: 4.3 mmol/L (ref 3.5–5.1)
Sodium: 139 mmol/L (ref 135–145)

## 2015-07-10 LAB — CBC WITH DIFFERENTIAL/PLATELET
BASOS ABS: 0 10*3/uL (ref 0.0–0.1)
Basophils Relative: 0 %
EOS PCT: 0 %
Eosinophils Absolute: 0 10*3/uL (ref 0.0–0.7)
HEMATOCRIT: 37.1 % (ref 36.0–46.0)
HEMOGLOBIN: 12.2 g/dL (ref 12.0–15.0)
LYMPHS ABS: 1.1 10*3/uL (ref 0.7–4.0)
LYMPHS PCT: 14 %
MCH: 29.9 pg (ref 26.0–34.0)
MCHC: 32.9 g/dL (ref 30.0–36.0)
MCV: 90.9 fL (ref 78.0–100.0)
Monocytes Absolute: 0.4 10*3/uL (ref 0.1–1.0)
Monocytes Relative: 5 %
NEUTROS ABS: 6.6 10*3/uL (ref 1.7–7.7)
NEUTROS PCT: 81 %
Platelets: 167 10*3/uL (ref 150–400)
RBC: 4.08 MIL/uL (ref 3.87–5.11)
RDW: 13.8 % (ref 11.5–15.5)
WBC: 8.1 10*3/uL (ref 4.0–10.5)

## 2015-07-10 LAB — HIV-1 RNA ULTRAQUANT REFLEX TO GENTYP+: HIV-1 RNA QUANT, LOG: UNDETERMINED {Log_copies}/mL

## 2015-07-10 LAB — EXPECTORATED SPUTUM ASSESSMENT W GRAM STAIN, RFLX TO RESP C

## 2015-07-10 LAB — PROTIME-INR
INR: 1 (ref 0.00–1.49)
Prothrombin Time: 13.4 seconds (ref 11.6–15.2)

## 2015-07-10 LAB — EXPECTORATED SPUTUM ASSESSMENT W REFEX TO RESP CULTURE

## 2015-07-10 LAB — STREP PNEUMONIAE URINARY ANTIGEN: STREP PNEUMO URINARY ANTIGEN: NEGATIVE

## 2015-07-10 NOTE — Progress Notes (Signed)
   07/10/15 1500  Clinical Encounter Type  Visited With Patient  Visit Type Initial  Referral From Care management  Spiritual Encounters  Spiritual Needs Emotional

## 2015-07-10 NOTE — Care Management Obs Status (Signed)
[facility] NOTIFICATION   Patient Details  Name: Shannon [name] MRN: [id number] Date of Birth: [birth year]   Medicare Observation Status Notification Given:  Yes    Carles Collet, RN 07/10/2015, 11:21 AM

## 2015-07-10 NOTE — Progress Notes (Signed)
SATURATION QUALIFICATIONS: (This note is used to comply with regulatory documentation for home oxygen)  Patient Saturations on Room Air at Rest = 90%  Patient Saturations on Room Air while Ambulating = 92%  Patient Saturations on 2 Liters of oxygen while Ambulating = 94%  Per [place] NT.

## 2015-07-10 NOTE — Progress Notes (Signed)
Subjective: Patient was seen and examined at bedside this am. She is still complaining of SOB and wheezing. Denies having any fevers or chills. No other complaints.   Objective: Vital signs in last 24 hours: Filed Vitals:   07/10/15 0528 07/10/15 0727 07/10/15 1143 07/10/15 1337  BP:    143/90  Pulse: 96   110  Temp:    98.2 F (36.8 C)  TempSrc:    Oral  Resp:    22  Height:      Weight:      SpO2: 90% 90% 90% 92%   Weight change:   Intake/Output Summary (Last 24 hours) at 07/10/15 1622 Last data filed at 07/10/15 1300  Gross per 24 hour  Intake    360 ml  Output      0 ml  Net    360 ml   Physical Exam: Constitutional: She is oriented to person, place, and time and well-developed, well-nourished, and in no distress.  Cardiovascular: Tachycardic. Regular rhythm. Distant heart sounds. Murmur present.   Pulmonary/Chest: Effort normal. No accessory muscle usage. No tachypnea. No respiratory distress. She has decreased breath sounds. No wheezing, rales, or rhonchi appreciated.  Abdominal: Soft. Bowel sounds are normal. There is no tenderness.  Skin: Skin is warm and dry.  Lab Results: Basic Metabolic Panel:  Recent Labs Lab 07/09/15 0655 07/10/15 0542  NA 138 139  K 3.5 4.3  CL 100* 102  CO2 31 30  GLUCOSE 147* 172*  BUN 14 18  CREATININE 1.02* 1.07*  CALCIUM 10.3 10.2   CBC:  Recent Labs Lab 07/09/15 0655 07/10/15 0542  WBC 7.7 8.1  NEUTROABS 5.2 6.6  HGB 12.9 12.2  HCT 40.2 37.1  MCV 90.7 90.9  PLT 175 167   Coagulation:  Recent Labs Lab 07/10/15 0911  LABPROT 13.4  INR 1.00   Micro Results: Recent Results (from the past 240 hour(s))  Blood culture (routine x 2)     Status: None (Preliminary result)   Collection Time: 07/09/15  8:53 AM  Result Value Ref Range Status   Specimen Description BLOOD RIGHT HAND  Final   Special Requests BOTTLES DRAWN AEROBIC AND ANAEROBIC 10ML  Final   Culture NO GROWTH 1 DAY  Final   Report Status PENDING   Incomplete  Blood culture (routine x 2)     Status: None (Preliminary result)   Collection Time: 07/09/15  9:01 AM  Result Value Ref Range Status   Specimen Description BLOOD RIGHT ANTECUBITAL  Final   Special Requests BOTTLES DRAWN AEROBIC AND ANAEROBIC 10ML  Final   Culture NO GROWTH 1 DAY  Final   Report Status PENDING  Incomplete  Culture, expectorated sputum-assessment     Status: None   Collection Time: 07/09/15 11:40 PM  Result Value Ref Range Status   Specimen Description SPUTUM  Final   Special Requests NONE  Final   Sputum evaluation   Final    THIS SPECIMEN IS ACCEPTABLE. RESPIRATORY CULTURE REPORT TO FOLLOW.   Report Status 07/10/2015 FINAL  Final   Studies/Results: Dg Chest 2 View  07/09/2015  CLINICAL DATA:  Cough and asthma. EXAM: CHEST - 2 VIEW COMPARISON:  None FINDINGS: The heart size and mediastinal contours are within normal limits. Opacity in the right middle lobe appears to be mostly atelectasis. However, adjacent opacity does suggests that this could also represent an acute infiltrate. There is no evidence of pulmonary edema, pneumothorax, nodule or pleural fluid. The visualized skeletal structures are unremarkable.  IMPRESSION: Right middle lobe opacity representing atelectasis and/or acute infiltrate. Electronically Signed   By: Aletta Edouard M.D.   On: 07/09/2015 08:07   Medications: I have reviewed the patient's current medications. Scheduled Meds: . antiseptic oral rinse  7 mL Mouth Rinse BID  . azithromycin (ZITHROMAX) 500 MG IVPB  500 mg Intravenous Q24H  . cefTRIAXone (ROCEPHIN)  IV  1 g Intravenous Q24H  . Darunavir Ethanolate  800 mg Oral Q breakfast  . dextromethorphan-guaiFENesin  1 tablet Oral BID  . enoxaparin (LOVENOX) injection  40 mg Subcutaneous Q24H  . ezetimibe  10 mg Oral q1800  . ipratropium-albuterol  3 mL Nebulization Q4H WA  . losartan  100 mg Oral Daily  . multivitamin with minerals  1 tablet Oral Daily  . pantoprazole  40 mg Oral  Daily  . raltegravir  400 mg Oral BID  . ritonavir  100 mg Oral Q breakfast   Continuous Infusions:  PRN Meds:.acetaminophen **OR** acetaminophen, albuterol, ALPRAZolam, iohexol Assessment/Plan: Principal Problem:   Dyspnea Active Problems:   History of pulmonary embolism   Asthma   HIV disease (HCC)   Essential hypertension   Mild diastolic dysfunction  Acute exacerbation of chronic asthma 2/2 acute bronchitis and exposure to cold air (known trigger for this patient) CTA of chest was ordered yesterday to r/o PE but patient refused the test. Lower extremity dopplers were negative for DVT. Her symptoms are likely from asthma exacerbation as patient did now have any more wheezing on exam today after been started on duonebs, albuterol and receiving Solumedrol in the ED yesterday. Upon ambulation, she is satting 92% on RA and 94% on 2L O2 via [place]. Patient is still tachycardic (P 88-110) likely due to treatment with duonebs/ albuterol. No leukocytosis. Blood culture showing no growth in 1 day.  -cont abx Ceftriaxone and Azithromycin  -duonebs q4h, albuterol q2h PRN -Mucinex-DM -O2  to keep O2sat>92%  -continuous pulse oximetry -monitor on telemetry given tachycardia -check pulse ox with ambulation  -PT/OT -ABG if decompensates -F/u final blood culture results  -F/u CBC in am   HIV disease Pt previous CD4/viral load in May 2016 was 506/<20. At present CD4 is 140 and HIV-1 RNA by PCR <20.  -cont ART -would like to connect and be followed by ID so will need referral upon d/c  Hypertension  Stable.  -cont home meds   GERD  Pt on chronic PPI at home. -protonix  Dyslipidemia  -cont statin therapy  FEN Fluids-None Electrolytes-Replete PRN Nutrition- Heart healthy  VTE prophylaxis  lovenox '40mg'$  SQ qd  Dispo: Disposition is deferred at this time, awaiting improvement of current medical problems.  Anticipated discharge in approximately 1-2 day(s).   The  patient does have a current PCP (No primary care provider on file.) and does need an [facility] hospital follow-up appointment after discharge.  The patient does not have transportation limitations that hinder transportation to clinic appointments.  .Services Needed at time of discharge: Y = Yes, Blank = No PT:   OT:   RN:   Equipment:   Other:     LOS: 1 day   Shela Leff, MD 07/10/2015, 4:22 PM

## 2015-07-10 NOTE — Progress Notes (Signed)
Patient was refusing getting another peripheral IV placed so that she could obtain a CT angiogram. The previous IV had infiltrated. I evaluated the patient, and I explained to her the risks of not identifying and treating a pulmonary embolism, including potentially fatal respiratory failure. She said, "She just didn't want to go that far. Can I get a nuclear test instead?" I described a V/Q scan, and she said she would be agreeable to that test. She denied any chest pain and reported breathing much better. She was satting 90% on room air.  Filed Vitals:   07/09/15 2056 07/09/15 2100 07/10/15 0526 07/10/15 0528  BP:  138/84 136/77   Pulse: 116 88 97 96  Temp:  98.6 F (37 C) 98 F (36.7 C)   TempSrc:  Oral Oral   Resp: '20 20 18   '$ Height:      Weight:      SpO2: 94% 94% 89% 90%   Cardiovascular: RRR. No murmurs, rubs or gallops Pulmonary: Scattered wheezes. Normal effort. Good air movement.  Assessment and Plan  Her vitals have much improved and she is no longer tachycardic, likely due to the diminished dosing of SABAs. In conjunction with her lack of chest pain, I think it is highly unlikely she is having a PE. - Consider V/Q scan

## 2015-07-10 NOTE — Progress Notes (Signed)
Patient has nicely refused to get walking O2 saturations with myself or Courtney NT multiple times r/t preferring to shower, eat, or talk on the phone. I will continue to attempt throughout rest of my shift.

## 2015-07-10 NOTE — Progress Notes (Signed)
Nutrition Brief Note  Patient identified on the Malnutrition Screening Tool (MST) Report  Wt Readings from Last 15 Encounters:  07/09/15 210 lb (95.255 kg)   Shannon [name] is a 59 y.o. femalewho has a PMH of well controlled HIV on ART (12/2014: CD4 506/viral load <20 diagnosed in [place] ~ 17 yrs ago), asthma, PE, HTN, HLD, "mild diastolic dysfunction".   Pt currently with good appetite; meal completion 75%. Pt is refusing Ensure supplements. RD will d/c.   Spoke with charge nurse, who reports pt is likely for d/c today.   Body mass index is 36.61 kg/(m^2). Patient meets criteria for obesity, class II based on current BMI.   Current diet order is Heart Healthy, patient is consuming approximately 75% of meals at this time. Labs and medications reviewed.   No nutrition interventions warranted at this time. If nutrition issues arise, please consult RD.   Ingvald Theisen A. Jimmye Norman, RD, LDN, CDE Pager: [phone] After hours Pager: [phone]

## 2015-07-11 DIAGNOSIS — J4541 Moderate persistent asthma with (acute) exacerbation: Secondary | ICD-10-CM | POA: Diagnosis not present

## 2015-07-11 DIAGNOSIS — R0600 Dyspnea, unspecified: Secondary | ICD-10-CM

## 2015-07-11 DIAGNOSIS — R938 Abnormal findings on diagnostic imaging of other specified body structures: Secondary | ICD-10-CM

## 2015-07-11 DIAGNOSIS — I10 Essential (primary) hypertension: Secondary | ICD-10-CM | POA: Diagnosis not present

## 2015-07-11 DIAGNOSIS — B20 Human immunodeficiency virus [HIV] disease: Secondary | ICD-10-CM

## 2015-07-11 DIAGNOSIS — J209 Acute bronchitis, unspecified: Secondary | ICD-10-CM | POA: Diagnosis not present

## 2015-07-11 DIAGNOSIS — Z86711 Personal history of pulmonary embolism: Secondary | ICD-10-CM

## 2015-07-11 LAB — CBC
HEMATOCRIT: 37.3 % (ref 36.0–46.0)
HEMOGLOBIN: 11.7 g/dL — AB (ref 12.0–15.0)
MCH: 29 pg (ref 26.0–34.0)
MCHC: 31.4 g/dL (ref 30.0–36.0)
MCV: 92.3 fL (ref 78.0–100.0)
Platelets: 176 10*3/uL (ref 150–400)
RBC: 4.04 MIL/uL (ref 3.87–5.11)
RDW: 14 % (ref 11.5–15.5)
WBC: 11.1 10*3/uL — ABNORMAL HIGH (ref 4.0–10.5)

## 2015-07-11 LAB — RESPIRATORY VIRUS PANEL
ADENOVIRUS: NEGATIVE
INFLUENZA B 1: NEGATIVE
Influenza A: NEGATIVE
METAPNEUMOVIRUS: NEGATIVE
PARAINFLUENZA 1 A: NEGATIVE
PARAINFLUENZA 3 A: NEGATIVE
Parainfluenza 2: NEGATIVE
Respiratory Syncytial Virus A: NEGATIVE
Respiratory Syncytial Virus B: NEGATIVE
Rhinovirus: NEGATIVE

## 2015-07-11 LAB — BASIC METABOLIC PANEL
Anion gap: 7 (ref 5–15)
BUN: 25 mg/dL — AB (ref 6–20)
CHLORIDE: 102 mmol/L (ref 101–111)
CO2: 30 mmol/L (ref 22–32)
CREATININE: 1.1 mg/dL — AB (ref 0.44–1.00)
Calcium: 9.9 mg/dL (ref 8.9–10.3)
GFR calc non Af Amer: 53 mL/min — ABNORMAL LOW (ref >60)
Glucose, Bld: 98 mg/dL (ref 65–99)
Potassium: 4.2 mmol/L (ref 3.5–5.1)
Sodium: 139 mmol/L (ref 135–145)

## 2015-07-11 LAB — LEGIONELLA PNEUMOPHILA SEROGP 1 UR AG: L. PNEUMOPHILA SEROGP 1 UR AG: NEGATIVE

## 2015-07-11 MED ORDER — IPRATROPIUM-ALBUTEROL 0.5-2.5 (3) MG/3ML IN SOLN: 3.0000 mL | RESPIRATORY_TRACT | Status: DC

## 2015-07-11 MED ORDER — DM-GUAIFENESIN ER 30-600 MG PO TB12: 1.0000 | ORAL_TABLET | Freq: Two times a day (BID) | ORAL | Status: DC

## 2015-07-11 MED ORDER — AZITHROMYCIN 250 MG PO TABS: 250.0000 mg | ORAL_TABLET | Freq: Every day | ORAL | Status: DC

## 2015-07-11 MED ORDER — ALBUTEROL SULFATE HFA 108 (90 BASE) MCG/ACT IN AERS: 2.0000 | INHALATION_SPRAY | Freq: Four times a day (QID) | RESPIRATORY_TRACT | Status: DC | PRN

## 2015-07-11 MED ORDER — IPRATROPIUM-ALBUTEROL 0.5-2.5 (3) MG/3ML IN SOLN: 3.0000 mL | RESPIRATORY_TRACT | Status: DC | PRN

## 2015-07-11 NOTE — Progress Notes (Signed)
Pt d/c home. Pt d/c via wheelchair with belongings by Apolonio Schneiders, Therapist, sports. Pt given d/c instructions by day shift RN. Ranelle Oyster, RN

## 2015-07-11 NOTE — Progress Notes (Signed)
Pt. Ready for discharge. Awaiting ride and transfer of medications to local pharmacy.   Alex Gardener, RN

## 2015-07-11 NOTE — Discharge Instructions (Signed)
Please call and make an appointment with your primacy care physician (PCP) for a follow-up visit within 2 weeks. Call the number on the back of your insurance card to obtain a PCP.   Finish taking your azithromycin for 2 more days.

## 2015-07-11 NOTE — Discharge Summary (Signed)
Name: Shannon [name] MRN: [id number] DOB: Oct 26, 1955 59 y.o. PCP: No primary care provider on file.  Date of Admission: 07/09/2015  6:28 AM Date of Discharge: 07/14/2015 Attending Physician: No att. providers found  Discharge Diagnosis:  Principal Problem:   Dyspnea Active Problems:   History of pulmonary embolism   Asthma   HIV disease ([place])   Essential hypertension   Mild diastolic dysfunction   Acute bronchitis   Opacity noted on imaging study  Discharge Medications:   Medication List    TAKE these medications        albuterol 108 (90 BASE) MCG/ACT inhaler  Commonly known as:  PROAIR HFA  Inhale 2 puffs into the lungs every 6 (six) hours as needed for wheezing or shortness of breath.     azithromycin 250 MG tablet  Commonly known as:  ZITHROMAX  Take 1 tablet (250 mg total) by mouth daily.     COQ10 PO  Take 1 tablet by mouth daily.     dextromethorphan-guaiFENesin 30-600 MG 12hr tablet  Commonly known as:  MUCINEX DM  Take 1 tablet by mouth 2 (two) times daily.     ezetimibe 10 MG tablet  Commonly known as:  ZETIA  Take 10 mg by mouth daily.     ipratropium-albuterol 0.5-2.5 (3) MG/3ML Soln  Commonly known as:  DUONEB  Take 3 mLs by nebulization every 4 (four) hours as needed.     losartan 100 MG tablet  Commonly known as:  COZAAR  Take 100 mg by mouth daily.     multivitamin with minerals Tabs tablet  Take 1 tablet by mouth daily.     NIFEdipine 10 MG capsule  Commonly known as:  PROCARDIA  Take 10 mg by mouth daily.     omeprazole 40 MG capsule  Commonly known as:  PRILOSEC  Take 40 mg by mouth daily.     POTASSIUM PO  Take 1 tablet by mouth daily.     PREZISTA 800 MG tablet  Generic drug:  Darunavir Ethanolate  Take 800 mg by mouth daily with breakfast.     raltegravir 400 MG tablet  Commonly known as:  ISENTRESS  Take 400 mg by mouth 2 (two) times daily.     ritonavir 100 MG Tabs tablet  Commonly known as:  NORVIR  Take 100 mg by  mouth daily with breakfast.     VITAMIN B-12 IJ  Inject 1,000 mcg as directed every 30 (thirty) days.     vitamin C 500 MG tablet  Commonly known as:  ASCORBIC ACID  Take 1,000 mg by mouth daily.     ZINC PO  Take 1 tablet by mouth daily.        Disposition and follow-up:   [name] was discharged from [facility] in Good condition.  At the hospital follow up visit please address:  1.  Asthma exacerbation HIV   2.  Labs / imaging needed at time of follow-up:  3.  Pending labs/ test needing follow-up: blood culture  Follow-up Appointments: Follow-up Information    Follow up with [name], MD. Go on 08/08/2015.   Specialty:  Infectious Diseases   Why:  Follow up appointment at 11:15 am.    Contact information:   [address] [place] [address] [phone]       Discharge Instructions:   Consultations:    Procedures Performed:  Dg Chest 2 View  07/09/2015  CLINICAL DATA:  Cough and  asthma. EXAM: CHEST - 2 VIEW COMPARISON:  None FINDINGS: The heart size and mediastinal contours are within normal limits. Opacity in the right middle lobe appears to be mostly atelectasis. However, adjacent opacity does suggests that this could also represent an acute infiltrate. There is no evidence of pulmonary edema, pneumothorax, nodule or pleural fluid. The visualized skeletal structures are unremarkable. IMPRESSION: Right middle lobe opacity representing atelectasis and/or acute infiltrate. Electronically Signed   By: Aletta Edouard M.D.   On: 07/09/2015 08:07   Admission HPI: Shannon [name] is a 60 y.o. femalewho has a PMH of well controlled HIV on ART (12/2014: CD4 506/viral load <20 diagnosed in [place] ~ 17 yrs ago), asthma, PE, HTN, HLD, "mild diastolic dysfunction".   She presents to the ED with worsening dyspnea for the past couple of weeks. She moved from [place] about 2 months ago to avoid the humidity which she said worsens her  asthma. She also has family in [place]. She has never been a smoker but was diagnosed with asthma in her 6s and using proair. She ran out of her nebulizer meds and has been using her proair about 4-6 times a day. Prior to a couple of weeks ago she was only using the proair twice daily. She also notes wheezing but denies chest tightness. She reports sick contacts and states her asthma tends to be triggered by cold air. She also endorses some productive cough of greenish sputum, fever/chills, and increased blood pressure. She had one episode of chest pain last PM that lasted a few seconds which she attributed to "gas." She reports having a LHC last year that was normal. She also provided records from a cardiologist with a TTE done in May 2016 that showed a normal LVEF with "mild diastolic dysfunction." Left and right atrium moderately dilated. PA pressures were normal.   In the ED, pt was hypoxic in the 80s on RA and was tachycardic. She was not febrile and without leukocytosis. She has decreased air movement and expiratory wheezing throughout. EKG showed sinus tachycardia. CXR showed a possible RML opacity representing atx and/or acute infiltrate. But felt opacity felt to be mostly atx. She was admitted for CAP and given duonebs, solumedrol and started on ceftriaxone and azithromycin for CAP.    Hospital Course by problem list: Principal Problem:   Dyspnea Active Problems:   History of pulmonary embolism   Asthma   HIV disease ([place])   Essential hypertension   Mild diastolic dysfunction   Acute bronchitis   Opacity noted on imaging study   Acute exacerbation of chronic asthma 2/2 acute bronchitis and exposure to cold air (known trigger for this patient) Clinical suspicion for PE was low. Lower extremity dopplers were negative for DVT. Patient refused CT angiogram as her symptoms were similar to previous asthma exacerbations. On initial exam had diffuse expiratory wheezing. She  responded well to oxygen, bronchodilators, and antibiotics. Antibiotics (Ceftriaxone and Azithromycin) were given to cover CAP as patient had a middle lobe opacity on CXR. She maintained good O2 saturation and was stable on the day of discharge. Patient did not want to establish care at our clinic. She was advised to establish care with a primary care physician in this community and schedule an appointment for a visit within the next 2 weeks.  HIV disease Pt previous CD4/viral load in May 2016 was 506/<20. At present CD4 is 140 and HIV-1 RNA by PCR <20. ART was continued during the hospitalization. She has been scheduled for a  follow-up appointment with ID on 08/08/15.   Discharge Vitals:   BP 153/95 mmHg  Pulse 104  Temp(Src) 98.4 F (36.9 C) (Oral)  Resp 18  Ht 5' 3.5" (1.613 m)  Wt 210 lb (95.255 kg)  BMI 36.61 kg/m2  SpO2 93%  Discharge Labs:  No results found for this or any previous visit (from the past 24 hour(s)).  Signed: Shela Leff, MD 07/14/2015, 2:06 AM    Services Ordered on Discharge:  Equipment Ordered on Discharge:

## 2015-07-11 NOTE — Progress Notes (Signed)
Pharmacist Provided - Patient Medication Education Prior to Discharge   Shannon [name] is an 59 y.o. female who presented to [facility] on 07/09/2015 with a chief complaint of  Chief Complaint  Patient presents with  . Shortness of Breath  . Asthma     '[x]'$  Patient will be discharged with 2 new medications  The following medications were discussed with the patient: mucinex, Duonebs, albuterol   Allergy Assessment Completed and Updated: '[x]'$  Yes    '[]'$  No Identified Patient Allergies:  Allergies  Allergen Reactions  . Ciprofloxacin Hives     Medication Adherence Assessment: '[x]'$  Excellent (no doses missed/week)      '[]'$  Good (1 dose missed/week)      '[]'$  Partial (2-3 doses missed/week)      '[]'$  Poor (>3 doses missed/week)  Barriers to Obtaining Medications: '[]'$  Yes '[x]'$  No   Assessment: patient stated that she thought the albuterol medication made her develop thrush/infection. I educated the patient that that was a side effect of another type of medication and not the one she would be using. She was then more keen to use this medication. She stated that she needed a new Proair prescription this was not sent previously by the provider. I contacted the nurse to alert her, she is planning on contacting the provider prior to discharge.  Time spent preparing for discharge counseling: 10 mins Time spent counseling patient: 20 mins  Darl Pikes, PharmD Clinical Pharmacist- Resident Pager: [phone]  Darl Pikes, PharmD 07/11/2015, 6:17 PM

## 2015-07-11 NOTE — Progress Notes (Signed)
Patient ID: Shannon [name], female   DOB: 1955/10/13, 59 y.o.   MRN: [id number] Medicine attending discharge note: I evaluated this patient together with resident physician Dr. Shela Leff on the day of discharge and I concur with the evaluation and discharge plan as recorded in her progress note.  Clinical summary: 59 year old woman originally from [place] recently living in [place]. She has long-standing intrinsic asthma. She was diagnosed with HIV/AIDS 17 years ago which has been well controlled on medications. She presented at the current time with increasing dyspnea, wheezing, cough productive of green sputum, subjective fever and chills, and chest tightness not responding to her home bronchodilators progressive over a 1-2 week interval. On presentation to the emergency department she was hypoxic. Diffuse expiratory wheezing. Questionable early infiltrate on chest x-ray. She gives a history of probable pulmonary embolus in the past treated with 6 months of anticoagulation. She states that her cardiologist and a hematology consultant said that she did not need to be on chronic anticoagulation.  Hospital course: On initial exam she had diffuse expiratory wheezing. She responded promptly to oxygen, bronchodilators, and antibiotics. There was a vague, questionable, infiltrate on chest x-ray. She was covered for community-acquired pneumonia. I did not feel that she needed to have a CT angiogram since clinical suspicion for pulmonary embolism was low and symptoms were typical of previous asthma exacerbations precipitated by acute bronchitis.  Her HIV was reevaluated. Although CD4 count was low at 140 quantitative HIV RNA was undetectable. She will continue her current antiretrovirals.  Disposition: Condition stable at time of discharge. She remains quite anxious and would've liked to stay in the hospital but objectively she improved significantly and is stable for outpatient management. We will  arrange for a primary care physician in this community and she will also follow-up with our infectious disease specialists. There were no complications.

## 2015-07-11 NOTE — Care Management Note (Signed)
Case Management Note  Patient Details  Name: Shannon [name] MRN: [id number] Date of Birth: 07-Jan-1956  Subjective/Objective:                  Date-07-09-15 Initial Assessment Spoke with patient at the bedside. Introduced self as Tourist information centre manager and explained role in discharge planning and how to be reached.  Verified patient lives at [address] apt [place] [address] Verified patient anticipates to go home alone, at time of discharge.  Patient has DME nebulizer she left in [place]. Expressed potential need for no other DME.  Patient denied needing help with their medication. Patient has [employer] Medicare and Medicaid from [place]. Patient takes the bus to MD appointments.  Verified patient has PCP none local. Follow up with [facility]. Patient states they currently receive [facility] services through no one.    Plan: CM will continue to follow for discharge planning and [facility] resources.   Carles Collet RN BSN CM [phone]   Action/Plan:  Referral made to [facility] for nebulizer and walker. Both delivered to room prior to discharge. Butch Penny CSW asked to address transportation needs. Patient requesting [facility] RN due to HIV diagnosis, however patient does not have any skilled nursing requirements at time of discharge. [place] is not home bound. Patient also requesting to be able to stay an extra night in the hospital because she is being discharged to home alone. CM explained that she would not be able to stay an extra midnight if she was medically stable for discharge for that reason.  Patient has a follow up appointment with ID, and she states she has several weeks of medications for her HIV.    Action/Plan:   Expected Discharge Date:                  Expected Discharge Plan:  Home/Self Care  In-House Referral:     Discharge planning Services  CM Consult  Post Acute Care Choice:    Choice offered to:     DME Arranged:    DME Agency:     HH Arranged:    HH Agency:      Status of Service:  In process, will continue to follow  Medicare Important Message Given:    Date Medicare IM Given:    Medicare IM give by:    Date Additional Medicare IM Given:    Additional Medicare Important Message give by:     If discussed at [place] of Stay Meetings, dates discussed:    Additional Comments:  Carles Collet, RN 07/11/2015, 2:53 PM

## 2015-07-11 NOTE — Progress Notes (Signed)
Patient's Sp02 saturations on room air at rest 94% and 92% on room air while ambulating. Patient denied shortness of breath. No dyspnea on exertion. Patient stated she felt "okay and good to go home". MD made aware.

## 2015-07-11 NOTE — Progress Notes (Signed)
1630: MD paged to have medications sent to different pharmacy that is local.   1830: RX sent to correct pharmacy. New discharge instructions printed for patient and reviewed with patient.

## 2015-07-11 NOTE — Progress Notes (Signed)
Subjective: Patient was seen and examined at bedside this am. States her breathing is better. Denies having any fevers or chills. Denies having any chest pain. No other complaints.   Objective: Vital signs in last 24 hours: Filed Vitals:   07/11/15 1245 07/11/15 1249 07/11/15 1448 07/11/15 1525  BP:   153/95   Pulse:   104   Temp:   98.4 F (36.9 C)   TempSrc:   Oral   Resp:   18   Height:      Weight:      SpO2: 94% 93% 92% 93%   Weight change:   Intake/Output Summary (Last 24 hours) at 07/11/15 1820 Last data filed at 07/11/15 1524  Gross per 24 hour  Intake    480 ml  Output      0 ml  Net    480 ml   Physical Exam: Constitutional: She is oriented to person, place, and time and well-developed, well-nourished, and in no distress.  Cardiovascular: Tachycardic. Regular rhythm. Distant heart sounds. Murmur present.   Pulmonary/Chest: Effort normal. No accessory muscle usage. No tachypnea. No respiratory distress. No wheezing, rales, or rhonchi appreciated.  Abdominal: Soft. Bowel sounds are normal. There is no tenderness.  Skin: Skin is warm and dry.  Lab Results: Basic Metabolic Panel:  Recent Labs Lab 07/10/15 0542 07/11/15 0718  NA 139 139  K 4.3 4.2  CL 102 102  CO2 30 30  GLUCOSE 172* 98  BUN 18 25*  CREATININE 1.07* 1.10*  CALCIUM 10.2 9.9   CBC:  Recent Labs Lab 07/09/15 0655 07/10/15 0542 07/11/15 0718  WBC 7.7 8.1 11.1*  NEUTROABS 5.2 6.6  --   HGB 12.9 12.2 11.7*  HCT 40.2 37.1 37.3  MCV 90.7 90.9 92.3  PLT 175 167 176   Coagulation:  Recent Labs Lab 07/10/15 0911  LABPROT 13.4  INR 1.00   Micro Results: Recent Results (from the past 240 hour(s))  Blood culture (routine x 2)     Status: None (Preliminary result)   Collection Time: 07/09/15  8:53 AM  Result Value Ref Range Status   Specimen Description BLOOD RIGHT HAND  Final   Special Requests BOTTLES DRAWN AEROBIC AND ANAEROBIC 10ML  Final   Culture NO GROWTH 2 DAYS  Final   Report Status PENDING  Incomplete  Blood culture (routine x 2)     Status: None (Preliminary result)   Collection Time: 07/09/15  9:01 AM  Result Value Ref Range Status   Specimen Description BLOOD RIGHT ANTECUBITAL  Final   Special Requests BOTTLES DRAWN AEROBIC AND ANAEROBIC 10ML  Final   Culture NO GROWTH 2 DAYS  Final   Report Status PENDING  Incomplete  Culture, expectorated sputum-assessment     Status: None   Collection Time: 07/09/15 11:40 PM  Result Value Ref Range Status   Specimen Description SPUTUM  Final   Special Requests NONE  Final   Sputum evaluation   Final    THIS SPECIMEN IS ACCEPTABLE. RESPIRATORY CULTURE REPORT TO FOLLOW.   Report Status 07/10/2015 FINAL  Final  Culture, respiratory (NON-Expectorated)     Status: None (Preliminary result)   Collection Time: 07/09/15 11:40 PM  Result Value Ref Range Status   Specimen Description SPUTUM  Final   Special Requests NONE  Final   Gram Stain   Final    FEW WBC PRESENT,BOTH PMN AND MONONUCLEAR RARE SQUAMOUS EPITHELIAL CELLS PRESENT FEW GRAM POSITIVE COCCI IN PAIRS IN CLUSTERS FEW GRAM NEGATIVE  RODS RARE GRAM POSITIVE RODS    Culture   Final    Culture reincubated for better growth Performed at [facility]    Report Status PENDING  Incomplete   Studies/Results: No results found. Medications: I have reviewed the patient's current medications. Scheduled Meds: . antiseptic oral rinse  7 mL Mouth Rinse BID  . azithromycin (ZITHROMAX) 500 MG IVPB  500 mg Intravenous Q24H  . cefTRIAXone (ROCEPHIN)  IV  1 g Intravenous Q24H  . Darunavir Ethanolate  800 mg Oral Q breakfast  . dextromethorphan-guaiFENesin  1 tablet Oral BID  . enoxaparin (LOVENOX) injection  40 mg Subcutaneous Q24H  . ezetimibe  10 mg Oral q1800  . ipratropium-albuterol  3 mL Nebulization Q4H WA  . losartan  100 mg Oral Daily  . multivitamin with minerals  1 tablet Oral Daily  . pantoprazole  40 mg Oral Daily  . raltegravir  400 mg Oral  BID  . ritonavir  100 mg Oral Q breakfast   Continuous Infusions:  PRN Meds:.acetaminophen **OR** acetaminophen, albuterol, ALPRAZolam, iohexol Assessment/Plan: Principal Problem:   Dyspnea Active Problems:   History of pulmonary embolism   Asthma   HIV disease (HCC)   Essential hypertension   Mild diastolic dysfunction  Acute exacerbation of chronic asthma 2/2 acute bronchitis and exposure to cold air (known trigger for this patient) CTA of chest to r/o PE but patient refused the test. Lower extremity dopplers were negative for DVT. Her symptoms are likely from asthma exacerbation as patient does not have any more wheezing on exam after been started on duonebs and albuterol. Upon ambulation, she is satting 94% on RA at rest and 92% on RA with ambulation. Blood culture showing no growth in 2 days. Patient reports feeling well today and will likely be discharged.  -D/c Ceftriaxone  -cont Azithromycin for a total of 5 days (transition to oral on discharge) -duonebs q4h, albuterol q2h PRN -Mucinex-DM -F/u final blood culture results   HIV disease Pt previous CD4/viral load in May 2016 was 506/<20. At present CD4 is 140 and HIV-1 RNA by PCR <20.  -cont ART -would like to connect and be followed by ID so will need referral upon d/c  Hypertension  Stable.  -cont home meds   GERD  Pt on chronic PPI at home. -protonix  Dyslipidemia  -cont statin therapy  FEN Fluids-None Electrolytes-Replete PRN Nutrition- Heart healthy  VTE prophylaxis  lovenox '40mg'$  SQ qd  Dispo: Disposition is deferred at this time, awaiting improvement of current medical problems.  Anticipated discharge in approximately 1-2 day(s).   The patient does have a current PCP (No primary care provider on file.) and does need an [facility] hospital follow-up appointment after discharge.  The patient does not have transportation limitations that hinder transportation to clinic appointments.  .Services  Needed at time of discharge: Y = Yes, Blank = No PT:   OT:   RN:   Equipment:   Other:     LOS: 2 days   Shela Leff, MD 07/11/2015, 6:20 PM

## 2015-07-12 LAB — CULTURE, RESPIRATORY W GRAM STAIN: Culture: NORMAL

## 2015-07-12 LAB — CULTURE, RESPIRATORY

## 2015-07-14 ENCOUNTER — Emergency Department (HOSPITAL_COMMUNITY)
Admission: EM | Admit: 2015-07-14 | Discharge: 2015-07-14 | Disposition: A | Discharge status: Home / Self Care | Payer: Medicare Other | Attending: Emergency Medicine | Admitting: Emergency Medicine

## 2015-07-14 ENCOUNTER — Encounter (HOSPITAL_COMMUNITY): Payer: Self-pay

## 2015-07-14 DIAGNOSIS — J441 Chronic obstructive pulmonary disease with (acute) exacerbation: Secondary | ICD-10-CM | POA: Diagnosis not present

## 2015-07-14 DIAGNOSIS — D649 Anemia, unspecified: Secondary | ICD-10-CM | POA: Insufficient documentation

## 2015-07-14 DIAGNOSIS — M1990 Unspecified osteoarthritis, unspecified site: Secondary | ICD-10-CM | POA: Insufficient documentation

## 2015-07-14 DIAGNOSIS — Z8701 Personal history of pneumonia (recurrent): Secondary | ICD-10-CM | POA: Diagnosis not present

## 2015-07-14 DIAGNOSIS — Z880 Allergy status to penicillin: Secondary | ICD-10-CM | POA: Insufficient documentation

## 2015-07-14 DIAGNOSIS — I10 Essential (primary) hypertension: Secondary | ICD-10-CM | POA: Insufficient documentation

## 2015-07-14 DIAGNOSIS — K219 Gastro-esophageal reflux disease without esophagitis: Secondary | ICD-10-CM | POA: Diagnosis not present

## 2015-07-14 DIAGNOSIS — Z8659 Personal history of other mental and behavioral disorders: Secondary | ICD-10-CM | POA: Insufficient documentation

## 2015-07-14 DIAGNOSIS — G8929 Other chronic pain: Secondary | ICD-10-CM | POA: Insufficient documentation

## 2015-07-14 DIAGNOSIS — R0602 Shortness of breath: Secondary | ICD-10-CM | POA: Diagnosis present

## 2015-07-14 DIAGNOSIS — Z760 Encounter for issue of repeat prescription: Secondary | ICD-10-CM

## 2015-07-14 DIAGNOSIS — Z792 Long term (current) use of antibiotics: Secondary | ICD-10-CM | POA: Insufficient documentation

## 2015-07-14 DIAGNOSIS — Z21 Asymptomatic human immunodeficiency virus [HIV] infection status: Secondary | ICD-10-CM | POA: Insufficient documentation

## 2015-07-14 DIAGNOSIS — Z79899 Other long term (current) drug therapy: Secondary | ICD-10-CM | POA: Diagnosis not present

## 2015-07-14 DIAGNOSIS — E785 Hyperlipidemia, unspecified: Secondary | ICD-10-CM | POA: Insufficient documentation

## 2015-07-14 DIAGNOSIS — R011 Cardiac murmur, unspecified: Secondary | ICD-10-CM | POA: Diagnosis not present

## 2015-07-14 LAB — CULTURE, BLOOD (ROUTINE X 2): Culture: NO GROWTH

## 2015-07-14 MED ORDER — ALBUTEROL SULFATE (2.5 MG/3ML) 0.083% IN NEBU
INHALATION_SOLUTION | RESPIRATORY_TRACT | Status: DC
Start: 2015-07-14 — End: 2015-07-14
  Filled 2015-07-14: qty 6

## 2015-07-14 MED ORDER — ALBUTEROL SULFATE (2.5 MG/3ML) 0.083% IN NEBU
5.0000 mg | INHALATION_SOLUTION | Freq: Once | RESPIRATORY_TRACT | Status: AC
  Administered 2015-07-14: 5 mg via RESPIRATORY_TRACT

## 2015-07-14 MED ORDER — ALBUTEROL SULFATE HFA 108 (90 BASE) MCG/ACT IN AERS: 2.0000 | INHALATION_SPRAY | Freq: Four times a day (QID) | RESPIRATORY_TRACT | Status: DC | PRN

## 2015-07-14 NOTE — ED Provider Notes (Signed)
CSN: [id number]     Arrival date & time 07/14/15  1710 History   First MD Initiated Contact with Patient 07/14/15 1739     Chief Complaint  Patient presents with  . Asthma  HPI  Shannon [name] is a 59 y.o. F PMH significant for HIV, HTN, HLD, COPD presenting for SOB and medication refill request. She was recently discharged from the hospital (12/17) for pneumonia. She states her sister passed away this morning, in this hospital, and because of her anxiety and stress surrounding this, she became SOB. She does not know what she did with her inhaler she was recently prescribed. She states her SOB is exactly like her previous asthmatic exacerbations in the past. She is taking azithromycin currently. No fevers, chills, CP, palpitations, N/V/D.   She recently moved to [place] from [place], and does not have a PCP.   Past Medical History  Diagnosis Date  . HIV antibody positive ([place])   . Hypertension   . Hyperlipidemia   . Heart murmur   . Chronic asthma with acute exacerbation     "I have chronic asthma all the time; sometimes exacerbations" (07/09/2015)  . Pneumonia 07/09/2015  . Sleep apnea     "never completed part 2 of study; never wore mask" (07/09/2015)  . Anemia   . History of blood transfusion     "related to my brain surgery I think"  . HIV disease ([place])   . GERD (gastroesophageal reflux disease)   . Arthritis     "starting to; in my hands" (07/09/2015)  . Chronic lower back pain   . Anxiety   . Cyst of right kidney     "3 of them; dx'd in ~ 01/2015"   Past Surgical History  Procedure Laterality Date  . Cardiac catheterization    . Tonsillectomy and adenoidectomy    . Abdominal hysterectomy      "robotic laparosopic"  . Brain surgery  1974    "brain tumor; benign; on top of my brain; got a plate in there"   History reviewed. No pertinent family history. Social History  Substance Use Topics  . Smoking status: Never Smoker   . Smokeless tobacco: Never Used  .  Alcohol Use: 0.0 oz/week    0 Standard drinks or equivalent per week     Comment: Rarely.   OB History    No data available     Review of Systems  Ten systems are reviewed and are negative for acute change except as noted in the HPI   Allergies  Ciprofloxacin  Home Medications   Prior to Admission medications   Medication Sig Start Date End Date Taking? Authorizing Provider  albuterol (PROAIR HFA) 108 (90 BASE) MCG/ACT inhaler Inhale 2 puffs into the lungs every 6 (six) hours as needed for wheezing or shortness of breath. 07/29/15   Collier Salina, MD  Coenzyme Q10 (COQ10 PO) Take 1 tablet by mouth daily.    Historical Provider, MD  Cyanocobalamin (VITAMIN B-12 IJ) Inject 1,000 mcg as directed every 30 (thirty) days.    Historical Provider, MD  Darunavir Ethanolate (PREZISTA) 800 MG tablet Take 800 mg by mouth daily with breakfast.    Historical Provider, MD  dextromethorphan-guaiFENesin (MUCINEX DM) 30-600 MG 12hr tablet Take 1 tablet by mouth 2 (two) times daily. 07/11/15   Tasrif Ahmed, MD  ezetimibe (ZETIA) 10 MG tablet Take 10 mg by mouth daily.    Historical Provider, MD  ipratropium-albuterol (DUONEB) 0.5-2.5 (3) MG/3ML SOLN Take 3  mLs by nebulization every 4 (four) hours as needed. 07/11/15   Dellia Nims, MD  losartan (COZAAR) 100 MG tablet Take 100 mg by mouth daily.    Historical Provider, MD  Multiple Vitamin (MULTIVITAMIN WITH MINERALS) TABS tablet Take 1 tablet by mouth daily.    Historical Provider, MD  Multiple Vitamins-Minerals (ZINC PO) Take 1 tablet by mouth daily.    Historical Provider, MD  NIFEdipine (PROCARDIA) 10 MG capsule Take 10 mg by mouth daily.    Historical Provider, MD  omeprazole (PRILOSEC) 40 MG capsule Take 40 mg by mouth daily.    Historical Provider, MD  POTASSIUM PO Take 1 tablet by mouth daily.    Historical Provider, MD  predniSONE (DELTASONE) 20 MG tablet Take 2 tablets (40 mg total) by mouth daily. For 5 days 07/29/15 07/28/16  Collier Salina, MD  raltegravir (ISENTRESS) 400 MG tablet Take 400 mg by mouth 2 (two) times daily.     Historical Provider, MD  ritonavir (NORVIR) 100 MG TABS tablet Take 100 mg by mouth daily with breakfast.    Historical Provider, MD  sulfamethoxazole-trimethoprim (BACTRIM DS,SEPTRA DS) 800-160 MG tablet Take 1 tablet by mouth 2 (two) times daily. For 5 days 07/29/15   Collier Salina, MD  vitamin C (ASCORBIC ACID) 500 MG tablet Take 1,000 mg by mouth daily.    Historical Provider, MD   BP 136/85 mmHg  Pulse 108  Temp(Src) 99.3 F (37.4 C) (Oral)  Resp 22  SpO2 95% Physical Exam  Constitutional: She appears well-developed and well-nourished. No distress.  HENT:  Head: Normocephalic and atraumatic.  Mouth/Throat: Oropharynx is clear and moist. No oropharyngeal exudate.  Eyes: Conjunctivae are normal. Pupils are equal, round, and reactive to light. Right eye exhibits no discharge. Left eye exhibits no discharge. No scleral icterus.  Neck: No tracheal deviation present.  Cardiovascular: Normal rate, regular rhythm, normal heart sounds and intact distal pulses.  Exam reveals no gallop and no friction rub.   No murmur heard. Pulmonary/Chest: Effort normal. No respiratory distress. She has wheezes. She has no rales. She exhibits no tenderness.  Expiratory wheezes present bilaterally  Abdominal: Soft. Bowel sounds are normal. She exhibits no distension and no mass. There is no tenderness. There is no rebound and no guarding.  Musculoskeletal: She exhibits no edema.  Lymphadenopathy:    She has no cervical adenopathy.  Neurological: She is alert. Coordination normal.  Skin: Skin is warm and dry. No rash noted. She is not diaphoretic. No erythema.  Psychiatric: She has a normal mood and affect. Her behavior is normal.  Nursing note and vitals reviewed.   ED Course  Procedures   MDM   Final diagnoses:  Shortness of breath  Medication refill   Patient anxious appearing, VSS. She does  present with wheezing, so will give breathing treatments and provide inhaler prescription. Patient on patient history and physical exam, most likely etiologies include obstructive lung disease vs pneumonia. Less likely etiologies include myocardial ischemia, foreign body aspiration, epiglottitis, angioedema, pulmonary edema (CHF, valve disease, myocarditis), pneumothorax, PE, pleural effusion, pneumonitis, malignancy. Patient already on abx for previous pneumonia.  Dr. Winfred Leeds to see patient as well.   Patient feeling better upon reevaluation after breathing treatment. She is requesting to go home. Discussed importance of follow-up. Patient may be safely discharged home. Discussed reasons for return. Patient to follow-up with primary care provider within one day. Patient in understanding and agreement with the plan.   Sumner Lions, PA-C 07/30/15  2141  Orlie Dakin, MD 07/31/15 9735

## 2015-07-14 NOTE — Progress Notes (Signed)
Patient came to hospital due to death of her sister, had episode while waiting to view sister's body.   After triage, chaplain escorted patient and other family members to deceased's room, where we had prayer, spiritual conversation. Chaplain escorted patient to E44.  Continued conversation and encouragement.  Will refer to oncoming chaplain for follow-up  Rev. Hyrum, [place]

## 2015-07-14 NOTE — ED Notes (Signed)
Pts sister passed away.  Pt very upset and c/o wheeziing  Shortness of breath.  Pt does not have her Albuterol inhaler with her and is requesting breathing treatment.

## 2015-07-14 NOTE — ED Provider Notes (Signed)
Patient felt asthma attack today when after notification that family member had died. She feels much improved after treatment here. On exam no respiratory distress lungs with minimal end expiratory wheezes and she states that she misplaced her inhaler. She does have her. She'll go home with albuterol inhaler she is to see [facility] tomorrow  Orlie Dakin, MD 07/14/15 [phone]

## 2015-07-14 NOTE — Discharge Instructions (Signed)
[name], [name] meeting you! Sorry to hear about your mother. Please follow-up with your appointment for tomorrow!   S. Wendie Simmer, PA-C  Shortness of Breath Shortness of breath means you have trouble breathing. It could also mean that you have a medical problem. You should get immediate medical care for shortness of breath. CAUSES   Not enough oxygen in the air such as with high altitudes or a smoke-filled room.  Certain lung diseases, infections, or problems.  Heart disease or conditions, such as angina or heart failure.  Low red blood cells (anemia).  Poor physical fitness, which can cause shortness of breath when you exercise.  Chest or back injuries or stiffness.  Being overweight.  Smoking.  Anxiety, which can make you feel like you are not getting enough air. DIAGNOSIS  Serious medical problems can often be found during your physical exam. Tests may also be done to determine why you are having shortness of breath. Tests may include:  Chest X-rays.  Lung function tests.  Blood tests.  An electrocardiogram (ECG).  An ambulatory electrocardiogram. An ambulatory ECG records your heartbeat patterns over a 24-hour period.  Exercise testing.  A transthoracic echocardiogram (TTE). During echocardiography, sound waves are used to evaluate how blood flows through your heart.  A transesophageal echocardiogram (TEE).  Imaging scans. Your health care provider may not be able to find a cause for your shortness of breath after your exam. In this case, it is important to have a follow-up exam with your health care provider as directed.  TREATMENT  Treatment for shortness of breath depends on the cause of your symptoms and can vary greatly. HOME CARE INSTRUCTIONS   Do not smoke. Smoking is a common cause of shortness of breath. If you smoke, ask for help to quit.  Avoid being around chemicals or things that may bother your breathing, such as paint fumes and dust.  Rest  as needed. Slowly resume your usual activities.  If medicines were prescribed, take them as directed for the full length of time directed. This includes oxygen and any inhaled medicines.  Keep all follow-up appointments as directed by your health care provider. SEEK MEDICAL CARE IF:   Your condition does not improve in the time expected.  You have a hard time doing your normal activities even with rest.  You have any new symptoms. SEEK IMMEDIATE MEDICAL CARE IF:   Your shortness of breath gets worse.  You feel light-headed, faint, or develop a cough not controlled with medicines.  You start coughing up blood.  You have pain with breathing.  You have chest pain or pain in your arms, shoulders, or abdomen.  You have a fever.  You are unable to walk up stairs or exercise the way you normally do. MAKE SURE YOU:  Understand these instructions.  Will watch your condition.  Will get help right away if you are not doing well or get worse.   This information is not intended to replace advice given to you by your health care provider. Make sure you discuss any questions you have with your health care provider.   Document Released: 05/05/2001 Document Revised: 08/15/2013 Document Reviewed: 10/26/2011 Elsevier Interactive Patient Education [employer].

## 2015-07-18 LAB — CULTURE, BLOOD (ROUTINE X 2)

## 2015-07-22 ENCOUNTER — Telehealth: Payer: Self-pay

## 2015-07-22 NOTE — Telephone Encounter (Signed)
Patient calling with complaint of continued productive cough and no relief with inhaler. She is currently taking Azithromycin and does not feel she is getting any better. Shortness of breath has worsened. She does not feel she can wait until her December appointment.   After reviewing patient's discharge I noticed she never followed up with [facility].  Patient states her sister died the date of her discharge and she was not able to go for appointment.   She was advised to call [facility] for hospital follow up so she can be evaluated by physician.   She will see [facility] on Tuesday for HIV Case Management . She was advised to call our office back  if she is not able to schedule appointment with [facility].   Laverle Patter, RN

## 2015-07-29 ENCOUNTER — Encounter: Payer: Self-pay | Admitting: Internal Medicine

## 2015-07-29 ENCOUNTER — Ambulatory Visit (INDEPENDENT_AMBULATORY_CARE_PROVIDER_SITE_OTHER): Payer: PRIVATE HEALTH INSURANCE | Admitting: Internal Medicine

## 2015-07-29 ENCOUNTER — Ambulatory Visit (HOSPITAL_COMMUNITY)
Admission: RE | Admit: 2015-07-29 | Discharge: 2015-07-29 | Disposition: A | Discharge status: Home / Self Care | Payer: Medicare Other | Source: Ambulatory Visit | Attending: Internal Medicine | Admitting: Internal Medicine

## 2015-07-29 VITALS — BP 198/105 | HR 92 | Temp 98.2°F | Ht 63.0 in | Wt 223.8 lb

## 2015-07-29 DIAGNOSIS — N390 Urinary tract infection, site not specified: Secondary | ICD-10-CM | POA: Insufficient documentation

## 2015-07-29 DIAGNOSIS — N3000 Acute cystitis without hematuria: Secondary | ICD-10-CM | POA: Diagnosis not present

## 2015-07-29 DIAGNOSIS — J189 Pneumonia, unspecified organism: Secondary | ICD-10-CM | POA: Insufficient documentation

## 2015-07-29 DIAGNOSIS — B20 Human immunodeficiency virus [HIV] disease: Secondary | ICD-10-CM | POA: Diagnosis not present

## 2015-07-29 LAB — CBC
HCT: 36.5 % (ref 36.0–46.0)
HEMOGLOBIN: 11.4 g/dL — AB (ref 12.0–15.0)
MCH: 29.3 pg (ref 26.0–34.0)
MCHC: 31.2 g/dL (ref 30.0–36.0)
MCV: 93.8 fL (ref 78.0–100.0)
Platelets: 179 10*3/uL (ref 150–400)
RBC: 3.89 MIL/uL (ref 3.87–5.11)
RDW: 13.9 % (ref 11.5–15.5)
WBC: 6.9 10*3/uL (ref 4.0–10.5)

## 2015-07-29 MED ORDER — SULFAMETHOXAZOLE-TRIMETHOPRIM 800-160 MG PO TABS: 1.0000 | ORAL_TABLET | Freq: Two times a day (BID) | ORAL | Status: DC

## 2015-07-29 MED ORDER — PREDNISONE 20 MG PO TABS: 40.0000 mg | ORAL_TABLET | Freq: Every day | ORAL | Status: DC

## 2015-07-29 MED ORDER — ALBUTEROL SULFATE HFA 108 (90 BASE) MCG/ACT IN AERS: 2.0000 | INHALATION_SPRAY | Freq: Four times a day (QID) | RESPIRATORY_TRACT | Status: DC | PRN

## 2015-07-29 NOTE — Assessment & Plan Note (Addendum)
Assessment: Community-acquired pneumonia that was the cause for her admission on November 15 to [facility] seems to be adequately treated at this time. She was continuing to have some dyspnea on exertion and expiratory wheezes in all lung fields. She was saturating 96% on room air however felt shortness of breath after speaking multiple sentences. She did not have any evidence of consolidation on exam, and repeat chest x-ray today shows partial resolution of the right middle lobe. Resolution of her leukocytosis with WBC of 6.3 today. Her ongoing symptoms at this point are most likely related to exacerbation of her asthma subsequent to an infection.   Plan: We'll treat with 5 day course of prednisone 40 mg for asthma exacerbation secondary to pneumonia

## 2015-07-29 NOTE — Patient Instructions (Signed)
Today we prescribed prednisone '40mg'$  once daily for 5 days to treat your current inflammation and asthma symptoms. Also prescribed Bactrim DS 1 tablet twice daily for 5 days to treat your urinary tract infection.  Your most recent CD4 count of 140 may be artificially low due to your pneumonia at the time. We will defer starting a long term prophylactic antibiotic until you see your Infectious Disease doctor.  If you feel worse or fail to improve please call the clinic at [phone] to let us know and arrange an earlier appointment.

## 2015-07-29 NOTE — Assessment & Plan Note (Signed)
Dysuria that has been occurring for the past 3-5 days without any hematuria. No suprapubic tenderness on exam. There is no flank tenderness to palpation. She does not have any fever or leukocytosis. She denies any vaginal pruritus or discharge during this time.  Bactrim DS twice a day for 5 days for uncomplicated cystitis

## 2015-07-29 NOTE — Assessment & Plan Note (Signed)
Diagnosis 17 years ago and has been well controlled on Prezista, Isentress, and Norvir. Her CD4 counts in the past for greater than 500 and does not have a history of severe opportunistic infections. During her last hospital course repeat CD4 show 140, but this is likely artificially low due to acute pneumonia at that time.  She may be a candidate for prophylactic Bactrim and to change to ART medications if a repeat CD4 count when she does not have pneumonia is also below 200. We will defer this to infectious disease, and she really needs to repeat this lab not during an acute illness and hospitalization.

## 2015-07-29 NOTE — Progress Notes (Signed)
Subjective:   Patient ID: Shannon [name] female   DOB: 01/28/1956 59 y.o.   MRN: [id number]  HPI: [name] is a 59 y.o. woman with past medical history as described below presenting for establishing primary care and hospital follow up for community acquired pneumonia. She was recently admitted to [facility] on 07/09/15 for CAP and treated with nebulizers and azithromycin x5 days. At that time her CD4 count, previously well controlled, was checked and was 140. She was discharged and intitially felt somewhat better but thinks her symptoms are relapsing now and she is developing shortness of breath with walking and feels her heart is racing. She using her albuterol nebulizer approximately 5-6 times daily. She is not having fever or chills.  See problem based assessment and plan below for additional details.   Past Medical History  Diagnosis Date  . HIV antibody positive ([place])   . Hypertension   . Hyperlipidemia   . Heart murmur   . Chronic asthma with acute exacerbation     "I have chronic asthma all the time; sometimes exacerbations" (07/09/2015)  . Pneumonia 07/09/2015  . Sleep apnea     "never completed part 2 of study; never wore mask" (07/09/2015)  . Anemia   . History of blood transfusion     "related to my brain surgery I think"  . HIV disease ([place])   . GERD (gastroesophageal reflux disease)   . Arthritis     "starting to; in my hands" (07/09/2015)  . Chronic lower back pain   . Anxiety   . Cyst of right kidney     "3 of them; dx'd in ~ 01/2015"   Current Outpatient Prescriptions  Medication Sig Dispense Refill  . albuterol (PROAIR HFA) 108 (90 BASE) MCG/ACT inhaler Inhale 2 puffs into the lungs every 6 (six) hours as needed for wheezing or shortness of breath. 6.7 g 0  . azithromycin (ZITHROMAX) 250 MG tablet Take 1 tablet (250 mg total) by mouth daily. 2 each 0  . Coenzyme Q10 (COQ10 PO) Take 1 tablet by mouth daily.    . Cyanocobalamin (VITAMIN B-12 IJ) Inject 1,000  mcg as directed every 30 (thirty) days.    . Darunavir Ethanolate (PREZISTA) 800 MG tablet Take 800 mg by mouth daily with breakfast.    . dextromethorphan-guaiFENesin (MUCINEX DM) 30-600 MG 12hr tablet Take 1 tablet by mouth 2 (two) times daily. 20 tablet 0  . ezetimibe (ZETIA) 10 MG tablet Take 10 mg by mouth daily.    [name] ipratropium-albuterol (DUONEB) 0.5-2.5 (3) MG/3ML SOLN Take 3 mLs by nebulization every 4 (four) hours as needed. 360 mL 1  . losartan (COZAAR) 100 MG tablet Take 100 mg by mouth daily.    . Multiple Vitamin (MULTIVITAMIN WITH MINERALS) TABS tablet Take 1 tablet by mouth daily.    . Multiple Vitamins-Minerals (ZINC PO) Take 1 tablet by mouth daily.    [name] NIFEdipine (PROCARDIA) 10 MG capsule Take 10 mg by mouth daily.    [name] omeprazole (PRILOSEC) 40 MG capsule Take 40 mg by mouth daily.    [name] POTASSIUM PO Take 1 tablet by mouth daily.    . raltegravir (ISENTRESS) 400 MG tablet Take 400 mg by mouth 2 (two) times daily.     . ritonavir (NORVIR) 100 MG TABS tablet Take 100 mg by mouth daily with breakfast.    . vitamin C (ASCORBIC ACID) 500 MG tablet Take 1,000 mg by mouth daily.     No current facility-administered medications  for this visit.   No family history on file. Social History   Social History  . Marital Status: Divorced    Spouse Name: N/A  . Number of Children: N/A  . Years of Education: N/A   Social History Main Topics  . Smoking status: Never Smoker   . Smokeless tobacco: Never Used  . Alcohol Use: 0.0 oz/week    0 Standard drinks or equivalent per week     Comment: Rarely.  . Drug Use: No  . Sexual Activity: Not Asked   Other Topics Concern  . None   Social History Narrative   Review of Systems: Review of Systems  Constitutional: Negative for fever and chills.  Eyes: Negative for blurred vision and double vision.  Respiratory: Positive for cough, sputum production, shortness of breath and wheezing.   Cardiovascular: Negative for chest pain.    Gastrointestinal: Negative for nausea, vomiting and diarrhea.  Genitourinary: Positive for dysuria.  Musculoskeletal: Negative for joint pain.  Neurological: Positive for weakness. Negative for focal weakness and headaches.  Psychiatric/Behavioral: The patient is nervous/anxious.     Objective:  Physical Exam: Filed Vitals:   07/29/15 1117  BP: 198/105  Pulse: 92  Temp: 98.2 F (36.8 C)  TempSrc: Oral  Height: '5\' 3"'$  (1.6 m)  Weight: 223 lb 12.8 oz (101.515 kg)  SpO2: 96%   GENERAL- alert, obese, co-operative, NAD HEENT- Atraumatic, PERRL, EOMI, oral mucosa appears moist, no cervical lymphadenopathy CARDIAC- RRR, Systolic ejection murmur RESP- Good air movement throughout, end expiratory wheezes diffusely ABDOMEN- Soft, nontender, no guarding or rebound EXTREMITIES- symmetric, no pedal edema. SKIN- Warm, dry, No rash or lesion. PSYCH- Labile but appropriate emotions related to discussing recent death of her sister  Assessment & Plan:

## 2015-07-31 ENCOUNTER — Other Ambulatory Visit: Payer: Self-pay | Admitting: Internal Medicine

## 2015-07-31 DIAGNOSIS — J189 Pneumonia, unspecified organism: Secondary | ICD-10-CM

## 2015-07-31 NOTE — Telephone Encounter (Signed)
Pt requesting all meds to be sent to [facility] @ [phone].

## 2015-08-01 ENCOUNTER — Telehealth: Payer: Self-pay | Admitting: Internal Medicine

## 2015-08-01 ENCOUNTER — Other Ambulatory Visit: Payer: Self-pay

## 2015-08-01 NOTE — Telephone Encounter (Signed)
Please call pt back.

## 2015-08-01 NOTE — Telephone Encounter (Signed)
Left msg for pt to call me back for specific needs

## 2015-08-01 NOTE — Telephone Encounter (Signed)
Pt called and would like all of her regular medications refilled through [facility].  She has enough to make it without running out until they can ship.  I advised her of clinic policy of 48 hours or 2 business days since she is newly established with [place].  Pt also reporting that she is prescribed 0.'5mg'$  xanax 1 by mouth TID prn for anxiety, however, she takes only prn for sleep.

## 2015-08-02 NOTE — Telephone Encounter (Signed)
Spoke with pt, her request for syringes for her B12 injection were added to a refill encounter.  Closing this encounter

## 2015-08-02 NOTE — Telephone Encounter (Signed)
Pt requesting [place] to order the syringes needed for her to self inject B12.  I advised I was unsure if [facility] carried them or if her insurance would cover but I could send to PCP.

## 2015-08-05 ENCOUNTER — Other Ambulatory Visit: Payer: Self-pay | Admitting: Internal Medicine

## 2015-08-05 MED ORDER — VITAMIN C 500 MG PO TABS: 1000.0000 mg | ORAL_TABLET | Freq: Every day | ORAL | Status: DC

## 2015-08-05 MED ORDER — ALPRAZOLAM 0.5 MG PO TABS: 0.5000 mg | ORAL_TABLET | Freq: Every day | ORAL | Status: DC

## 2015-08-05 MED ORDER — ADULT MULTIVITAMIN W/MINERALS CH: 1.0000 | ORAL_TABLET | Freq: Every day | ORAL | Status: DC

## 2015-08-05 MED ORDER — LOSARTAN POTASSIUM 100 MG PO TABS: 100.0000 mg | ORAL_TABLET | Freq: Every day | ORAL | Status: DC

## 2015-08-05 MED ORDER — ZINC PO LOZG: 1.0000 | LOZENGE | Freq: Every day | ORAL | Status: DC

## 2015-08-05 MED ORDER — CYANOCOBALAMIN 1000 MCG/ML IJ SOLN: 1000.0000 ug | INTRAMUSCULAR | Status: DC

## 2015-08-05 MED ORDER — IPRATROPIUM-ALBUTEROL 0.5-2.5 (3) MG/3ML IN SOLN: 3.0000 mL | RESPIRATORY_TRACT | Status: DC | PRN

## 2015-08-05 MED ORDER — EZETIMIBE 10 MG PO TABS: 10.0000 mg | ORAL_TABLET | Freq: Every day | ORAL | Status: DC

## 2015-08-05 MED ORDER — DARUNAVIR ETHANOLATE 800 MG PO TABS: 800.0000 mg | ORAL_TABLET | Freq: Every day | ORAL | Status: DC

## 2015-08-05 MED ORDER — RALTEGRAVIR POTASSIUM 400 MG PO TABS: 400.0000 mg | ORAL_TABLET | Freq: Two times a day (BID) | ORAL | Status: DC

## 2015-08-05 MED ORDER — ALBUTEROL SULFATE HFA 108 (90 BASE) MCG/ACT IN AERS: 2.0000 | INHALATION_SPRAY | Freq: Four times a day (QID) | RESPIRATORY_TRACT | Status: DC | PRN

## 2015-08-05 MED ORDER — "SYRINGE 25G X 5/8"" 3 ML MISC": 1.0000 | Status: DC

## 2015-08-05 MED ORDER — RITONAVIR 100 MG PO TABS
100.0000 mg | ORAL_TABLET | Freq: Every day | ORAL | Status: DC
Start: 2015-08-05 — End: 2015-08-15

## 2015-08-05 MED ORDER — OMEPRAZOLE 40 MG PO CPDR: 40.0000 mg | DELAYED_RELEASE_CAPSULE | Freq: Every day | ORAL | Status: DC

## 2015-08-05 MED ORDER — NIFEDIPINE 10 MG PO CAPS: 10.0000 mg | ORAL_CAPSULE | Freq: Every day | ORAL | Status: DC

## 2015-08-05 NOTE — Telephone Encounter (Signed)
Shannon [name] from [facility]  requesting the nurse to call back regarding Dr. Benjamine Mola DEA number did not accept by the insurance. Please call back.

## 2015-08-05 NOTE — Telephone Encounter (Signed)
Taken care of with pharmacist.

## 2015-08-05 NOTE — Telephone Encounter (Signed)
Called in updated Rx for #90 to pharmacy.

## 2015-08-08 ENCOUNTER — Encounter: Payer: Self-pay | Admitting: Internal Medicine

## 2015-08-08 ENCOUNTER — Ambulatory Visit (INDEPENDENT_AMBULATORY_CARE_PROVIDER_SITE_OTHER): Payer: Medicare Other | Admitting: Internal Medicine

## 2015-08-08 VITALS — BP 174/120 | HR 92 | Temp 98.5°F | Wt 215.0 lb

## 2015-08-08 DIAGNOSIS — I10 Essential (primary) hypertension: Secondary | ICD-10-CM

## 2015-08-08 DIAGNOSIS — B20 Human immunodeficiency virus [HIV] disease: Secondary | ICD-10-CM

## 2015-08-08 DIAGNOSIS — G9331 Postviral fatigue syndrome: Secondary | ICD-10-CM

## 2015-08-08 DIAGNOSIS — J4520 Mild intermittent asthma, uncomplicated: Secondary | ICD-10-CM | POA: Diagnosis not present

## 2015-08-08 DIAGNOSIS — G933 Postviral fatigue syndrome: Secondary | ICD-10-CM

## 2015-08-08 LAB — CBC WITH DIFFERENTIAL/PLATELET
Basophils Absolute: 0 10*3/uL (ref 0.0–0.1)
Basophils Relative: 0 % (ref 0–1)
Eosinophils Absolute: 0.1 10*3/uL (ref 0.0–0.7)
Eosinophils Relative: 1 % (ref 0–5)
HCT: 37.4 % (ref 36.0–46.0)
HEMOGLOBIN: 12.4 g/dL (ref 12.0–15.0)
LYMPHS ABS: 1.8 10*3/uL (ref 0.7–4.0)
Lymphocytes Relative: 27 % (ref 12–46)
MCH: 29.5 pg (ref 26.0–34.0)
MCHC: 33.2 g/dL (ref 30.0–36.0)
MCV: 89 fL (ref 78.0–100.0)
MONOS PCT: 9 % (ref 3–12)
MPV: 9.5 fL (ref 8.6–12.4)
Monocytes Absolute: 0.6 10*3/uL (ref 0.1–1.0)
NEUTROS ABS: 4.2 10*3/uL (ref 1.7–7.7)
NEUTROS PCT: 63 % (ref 43–77)
Platelets: 220 10*3/uL (ref 150–400)
RBC: 4.2 MIL/uL (ref 3.87–5.11)
RDW: 13.8 % (ref 11.5–15.5)
WBC: 6.6 10*3/uL (ref 4.0–10.5)

## 2015-08-08 LAB — TSH: TSH: 0.69 u[IU]/mL (ref 0.350–4.500)

## 2015-08-08 LAB — T4, FREE: Free T4: 1.09 ng/dL (ref 0.80–1.80)

## 2015-08-08 NOTE — Addendum Note (Signed)
Addended by: Collier Salina on: 08/08/2015 08:56 AM   Modules accepted: Level of Service

## 2015-08-08 NOTE — Progress Notes (Signed)
Patient ID: Shannon [name], female   DOB: 05/02/1956, 59 y.o.   MRN: [id number]       Patient ID: Shannon [name], female   DOB: 1956/08/07, 59 y.o.   MRN: [id number]  HPI 59yo F with HIV disease, CD 4 count of 140/VL<20 ( in setting of illness), truvada/DRVr. She has had long standing hiv disease. Diagnosed in early 1990s, managed in [place], briefly relocated to [place] and now in [place]. She states that it is unclear how she contracted HIV, her sexual partner have been negative. She was a Marine scientist, and doesn't recall needlesticks. She did have a benign brain tumor excised in mid 1980s where she had blood transfusions. She reports that her previous HIV doctors, too, felt that she may have contracted HIV early on in the epidemic. She was diagnosed with CD 4 count <200. Since starting treatment shortly after diagnosis, she has remained undetectable. She has been on her current regimen for roughly 10 yrs. She has been back in [place] looking to establish care with pcp, and infectious diseases.  She was recently hospitalized for pneumonia for which her CD 4 count was tested in setting of acute illness and found to be < 200 where it previously was in 500. Her viral load was undetectable. She states that she is slowly recovering from hospitalization. She does still report having occasional wheezing.  Outpatient Encounter Prescriptions as of 08/08/2015  Medication Sig  . albuterol (PROAIR HFA) 108 (90 BASE) MCG/ACT inhaler Inhale 2 puffs into the lungs every 6 (six) hours as needed for wheezing or shortness of breath.  . ALPRAZolam (XANAX) 0.5 MG tablet Take 1 tablet (0.5 mg total) by mouth at bedtime.  . Coenzyme Q10 (COQ10 PO) Take 1 tablet by mouth daily.  . cyanocobalamin (,VITAMIN B-12,) 1000 MCG/ML injection Inject 1 mL (1,000 mcg total) into the muscle every 30 (thirty) days.  . Cyanocobalamin (VITAMIN B-12 IJ) Inject 1,000 mcg as directed every 30 (thirty) days.  . Darunavir Ethanolate (PREZISTA) 800 MG tablet Take 1  tablet (800 mg total) by mouth daily with breakfast.  . dextromethorphan-guaiFENesin (MUCINEX DM) 30-600 MG 12hr tablet Take 1 tablet by mouth 2 (two) times daily.  [name] ezetimibe (ZETIA) 10 MG tablet Take 1 tablet (10 mg total) by mouth daily.  [name] ipratropium-albuterol (DUONEB) 0.5-2.5 (3) MG/3ML SOLN Take 3 mLs by nebulization every 4 (four) hours as needed.  [name] losartan (COZAAR) 100 MG tablet Take 1 tablet (100 mg total) by mouth daily.  . Multiple Vitamin (MULTIVITAMIN WITH MINERALS) TABS tablet Take 1 tablet by mouth daily.  . Multiple Vitamins-Minerals (ZINC) LOZG Take 1 lozenge by mouth daily.  [name] NIFEdipine (PROCARDIA) 10 MG capsule Take 1 capsule (10 mg total) by mouth daily.  [name] omeprazole (PRILOSEC) 40 MG capsule Take 1 capsule (40 mg total) by mouth daily.  [name] POTASSIUM PO Take 1 tablet by mouth daily.  . predniSONE (DELTASONE) 20 MG tablet Take 2 tablets (40 mg total) by mouth daily. For 5 days  . raltegravir (ISENTRESS) 400 MG tablet Take 1 tablet (400 mg total) by mouth 2 (two) times daily.  . ritonavir (NORVIR) 100 MG TABS tablet Take 1 tablet (100 mg total) by mouth daily with breakfast.  . sulfamethoxazole-trimethoprim (BACTRIM DS,SEPTRA DS) 800-160 MG tablet Take 1 tablet by mouth 2 (two) times daily. For 5 days  . Syringe/Needle, Disp, (SYRINGE 3CC/25GX5/8") 25G X 5/8" 3 ML MISC 1 Syringe by Does not apply route every 30 (thirty) days.  . vitamin C (ASCORBIC  ACID) 500 MG tablet Take 2 tablets (1,000 mg total) by mouth daily.   No facility-administered encounter medications on file as of 08/08/2015.     Patient Active Problem List   Diagnosis Date Noted  . CAP (community acquired pneumonia) 07/29/2015  . UTI (urinary tract infection) 07/29/2015  . Acute bronchitis   . Opacity noted on imaging study   . Dyspnea 07/09/2015  . History of pulmonary embolism 07/09/2015  . Asthma 07/09/2015  . HIV disease ([place]) 07/09/2015  . Essential hypertension 07/09/2015  . Mild diastolic  dysfunction 75/05/2584     Health Maintenance Due  Topic Date Due  . TETANUS/TDAP  03/03/1973  . PAP SMEAR  03/04/1975  . MAMMOGRAM  03/03/2004  . COLONOSCOPY  03/03/2004  . ZOSTAVAX  03/03/2014  . INFLUENZA VACCINE  03/25/2015     Review of Systems Review of Systems  Constitutional: Negative for fever, chills, diaphoresis, activity change, appetite change, and unexpected weight change. Except for fatigue HENT: Negative for congestion, sore throat, rhinorrhea, sneezing, trouble swallowing and sinus pressure.  Eyes: Negative for photophobia and visual disturbance.  Respiratory: Negative for cough, chest tightness, shortness of breath, wheezing and stridor.  Cardiovascular: Negative for chest pain, palpitations and leg swelling.  Gastrointestinal: Negative for nausea, vomiting, abdominal pain, diarrhea, constipation, blood in stool, abdominal distention and anal bleeding.  Genitourinary: Negative for dysuria, hematuria, flank pain and difficulty urinating.  Musculoskeletal: Negative for myalgias, back pain, joint swelling, arthralgias and gait problem.  Skin: Negative for color change, pallor, rash and wound.  Neurological: Negative for dizziness, tremors, weakness and light-headedness.  Hematological: Negative for adenopathy. Does not bruise/bleed easily.  Psychiatric/Behavioral: Negative for behavioral problems, confusion, sleep disturbance, dysphoric mood, decreased concentration and agitation.    Physical Exam   BP 174/120 mmHg  Pulse 92  Temp(Src) 98.5 F (36.9 C) (Oral)  Wt 215 lb (97.523 kg)  SpO2 92% Physical Exam  Constitutional:  oriented to person, place, and time. appears well-developed and well-nourished. No distress.  HENT: [place]/AT, PERRLA, no scleral icterus Mouth/Throat: Oropharynx is clear and moist. No oropharyngeal exudate.  Cardiovascular: Normal rate, regular rhythm and normal heart sounds. Exam reveals no gallop and no friction rub.  No murmur heard.    Pulmonary/Chest: Effort normal and breath sounds normal. No respiratory distress. Mild wheezing at bases Neck = supple, no nuchal rigidity Abdominal: Soft. Bowel sounds are normal.  exhibits no distension. There is no tenderness.  Lymphadenopathy: no cervical adenopathy. No axillary adenopathy Neurological: alert and oriented to person, place, and time.  Skin: Skin is warm and dry. No rash noted. No erythema.  Psychiatric: a normal mood and affect.  behavior is normal.   Lab Results  Component Value Date   CD4TCELL 20* 07/09/2015   Lab Results  Component Value Date   CD4TABS 140* 07/09/2015   No results found for: HIV1RNAQUANT No results found for: HEPBSAB No results found for: RPR  CBC Lab Results  Component Value Date   WBC 6.9 07/29/2015   RBC 3.89 07/29/2015   HGB 11.4* 07/29/2015   HCT 36.5 07/29/2015   PLT 179 07/29/2015   MCV 93.8 07/29/2015   MCH 29.3 07/29/2015   MCHC 31.2 07/29/2015   RDW 13.9 07/29/2015   LYMPHSABS 1.1 07/10/2015   MONOABS 0.4 07/10/2015   EOSABS 0.0 07/10/2015   BASOSABS 0.0 07/10/2015   BMET Lab Results  Component Value Date   NA 139 07/11/2015   K 4.2 07/11/2015   CL 102 07/11/2015  CO2 30 07/11/2015   GLUCOSE 98 07/11/2015   BUN 25* 07/11/2015   CREATININE 1.10* 07/11/2015   CALCIUM 9.9 07/11/2015   GFRNONAA 53* 07/11/2015   GFRAA >60 07/11/2015     Assessment and Plan   Fatigue = will check thyroid function and cbc  hiv disease = will recheck her CD 4 count. Likely low in setting of pneumonia. By now should be back to her baseline. If still < 200. Will need to keep her on pcp prophylaxis  Asthma = still has some signs of wheezing, will need to continue with albuterol   Hypertension = continue losartan. Will recheck again at next visit to see if need to readjust meds.   Health maintenance = will need to do pap smear and mammo scheduled at next visit

## 2015-08-08 NOTE — Progress Notes (Signed)
Internal Medicine Clinic Attending  I saw and evaluated the patient.  I personally confirmed the key portions of the history and exam documented by Dr. Rice and I reviewed pertinent patient test results.  The assessment, diagnosis, and plan were formulated together and I agree with the documentation in the resident's note.  

## 2015-08-09 LAB — T-HELPER CELL (CD4) - (RCID CLINIC ONLY)
CD4 T CELL HELPER: 33 % (ref 33–55)
CD4 T Cell Abs: 620 /uL (ref 400–2700)

## 2015-08-12 ENCOUNTER — Other Ambulatory Visit: Payer: Self-pay | Admitting: *Deleted

## 2015-08-12 ENCOUNTER — Ambulatory Visit: Payer: PRIVATE HEALTH INSURANCE | Admitting: Internal Medicine

## 2015-08-12 NOTE — Telephone Encounter (Signed)
Clarified script.

## 2015-08-15 ENCOUNTER — Ambulatory Visit (HOSPITAL_COMMUNITY)
Admission: RE | Admit: 2015-08-15 | Discharge: 2015-08-15 | Disposition: A | Discharge status: Home / Self Care | Payer: Medicare Other | Source: Ambulatory Visit | Attending: Internal Medicine | Admitting: Internal Medicine

## 2015-08-15 ENCOUNTER — Ambulatory Visit (INDEPENDENT_AMBULATORY_CARE_PROVIDER_SITE_OTHER): Payer: Medicare Other | Admitting: Internal Medicine

## 2015-08-15 ENCOUNTER — Encounter: Payer: Self-pay | Admitting: Internal Medicine

## 2015-08-15 VITALS — BP 182/121 | HR 102 | Temp 98.2°F | Ht 63.5 in | Wt 226.2 lb

## 2015-08-15 DIAGNOSIS — Z7951 Long term (current) use of inhaled steroids: Secondary | ICD-10-CM

## 2015-08-15 DIAGNOSIS — I10 Essential (primary) hypertension: Secondary | ICD-10-CM

## 2015-08-15 DIAGNOSIS — R0602 Shortness of breath: Secondary | ICD-10-CM | POA: Diagnosis not present

## 2015-08-15 DIAGNOSIS — Z79899 Other long term (current) drug therapy: Secondary | ICD-10-CM

## 2015-08-15 DIAGNOSIS — J189 Pneumonia, unspecified organism: Secondary | ICD-10-CM

## 2015-08-15 DIAGNOSIS — J4540 Moderate persistent asthma, uncomplicated: Secondary | ICD-10-CM

## 2015-08-15 DIAGNOSIS — Z09 Encounter for follow-up examination after completed treatment for conditions other than malignant neoplasm: Secondary | ICD-10-CM | POA: Diagnosis not present

## 2015-08-15 DIAGNOSIS — Z724 Inappropriate diet and eating habits: Secondary | ICD-10-CM

## 2015-08-15 DIAGNOSIS — N3000 Acute cystitis without hematuria: Secondary | ICD-10-CM

## 2015-08-15 DIAGNOSIS — Z21 Asymptomatic human immunodeficiency virus [HIV] infection status: Secondary | ICD-10-CM

## 2015-08-15 DIAGNOSIS — B20 Human immunodeficiency virus [HIV] disease: Secondary | ICD-10-CM

## 2015-08-15 DIAGNOSIS — J4541 Moderate persistent asthma with (acute) exacerbation: Secondary | ICD-10-CM

## 2015-08-15 MED ORDER — BUDESONIDE-FORMOTEROL FUMARATE 80-4.5 MCG/ACT IN AERO
2.0000 | INHALATION_SPRAY | Freq: Every day | RESPIRATORY_TRACT | Status: DC
Start: 2015-08-15 — End: 2015-10-10

## 2015-08-15 MED ORDER — RITONAVIR 100 MG PO TABS: 100.0000 mg | ORAL_TABLET | Freq: Every day | ORAL | Status: DC

## 2015-08-15 NOTE — Progress Notes (Signed)
Subjective:   Patient ID: Shannon [name] female   DOB: 1955-10-10 59 y.o.   MRN: [id number]  HPI: [name] is a 59 y.o. woman with past medical history as described below presenting for follow up of her asthma exacerbation due to community acquired pneumonia. She completed her course of prednisone with improvement in her wheezing and dyspnea but these have recurred since finishing them. Her symptoms are prominent at night causing her awaken short of breath occasionally. She also has dyspnea with exertion and estimates she is using her albuterol 4-6 times daily.  She also reports about a 3-5 lb weight gain over the interval and increased blood pressure when she has checked this at home. She feels this is related to poor diet consisting primarily of starches and high salt intake. She denies any associated headache or chest pain. She does not have swelling in her legs.  See problem based assessment and plan below for additional details.  Past Medical History  Diagnosis Date  . HIV antibody positive ([place])   . Hypertension   . Hyperlipidemia   . Heart murmur   . Chronic asthma with acute exacerbation     "I have chronic asthma all the time; sometimes exacerbations" (07/09/2015)  . Pneumonia 07/09/2015  . Sleep apnea     "never completed part 2 of study; never wore mask" (07/09/2015)  . Anemia   . History of blood transfusion     "related to my brain surgery I think"  . HIV disease ([place])   . GERD (gastroesophageal reflux disease)   . Arthritis     "starting to; in my hands" (07/09/2015)  . Chronic lower back pain   . Anxiety   . Cyst of right kidney     "3 of them; dx'd in ~ 01/2015"   Current Outpatient Prescriptions  Medication Sig Dispense Refill  . albuterol (PROAIR HFA) 108 (90 BASE) MCG/ACT inhaler Inhale 2 puffs into the lungs every 6 (six) hours as needed for wheezing or shortness of breath. 6.7 g 0  . ALPRAZolam (XANAX) 0.5 MG tablet Take 1 tablet (0.5 mg total) by mouth  at bedtime. 90 tablet 0  . budesonide-formoterol (SYMBICORT) 80-4.5 MCG/ACT inhaler Inhale 2 puffs into the lungs daily. 1 Inhaler 12  . Coenzyme Q10 (COQ10 PO) Take 1 tablet by mouth daily.    . cyanocobalamin (,VITAMIN B-12,) 1000 MCG/ML injection Inject 1 mL (1,000 mcg total) into the muscle every 30 (thirty) days. 1 mL 3  . Darunavir Ethanolate (PREZISTA) 800 MG tablet Take 1 tablet (800 mg total) by mouth daily with breakfast. 90 tablet 0  . ezetimibe (ZETIA) 10 MG tablet Take 1 tablet (10 mg total) by mouth daily. 90 tablet 0  . losartan (COZAAR) 100 MG tablet Take 1 tablet (100 mg total) by mouth daily. 90 tablet 0  . Multiple Vitamin (MULTIVITAMIN WITH MINERALS) TABS tablet Take 1 tablet by mouth daily. 90 tablet   . Multiple Vitamins-Minerals (ZINC) LOZG Take 1 lozenge by mouth daily. 100 lozenge 0  . NIFEdipine (PROCARDIA) 10 MG capsule Take 1 capsule (10 mg total) by mouth daily. 90 capsule 0  . omeprazole (PRILOSEC) 40 MG capsule Take 1 capsule (40 mg total) by mouth daily. 90 capsule 0  . POTASSIUM PO Take 1 tablet by mouth daily.    . raltegravir (ISENTRESS) 400 MG tablet Take 1 tablet (400 mg total) by mouth 2 (two) times daily. 180 tablet 0  . ritonavir (NORVIR) 100 MG TABS tablet Take  1 tablet (100 mg total) by mouth daily with breakfast. 90 tablet 1  . Syringe/Needle, Disp, (SYRINGE 3CC/25GX5/8") 25G X 5/8" 3 ML MISC 1 Syringe by Does not apply route every 30 (thirty) days. 50 each 0  . vitamin C (ASCORBIC ACID) 500 MG tablet Take 2 tablets (1,000 mg total) by mouth daily. 180 tablet 0   No current facility-administered medications for this visit.   Family History  Problem Relation Age of Onset  . Asthma Mother    Social History   Social History  . Marital Status: Divorced    Spouse Name: N/A  . Number of Children: N/A  . Years of Education: N/A   Social History Main Topics  . Smoking status: Never Smoker   . Smokeless tobacco: Never Used  . Alcohol Use: 0.0  oz/week    0 Standard drinks or equivalent per week     Comment: Rarely.  . Drug Use: No  . Sexual Activity:    Partners: Male   Other Topics Concern  . None   Social History Narrative   Review of Systems: Review of Systems  Constitutional: Positive for malaise/fatigue. Negative for fever and chills.  HENT: Negative for congestion.   Eyes: Negative for blurred vision and double vision.  Respiratory: Positive for shortness of breath.   Cardiovascular: Negative for chest pain and leg swelling.  Gastrointestinal: Negative for abdominal pain and diarrhea.  Musculoskeletal: Positive for back pain.  Skin: Negative for rash.  Neurological: Negative for headaches.  Endo/Heme/Allergies: Positive for environmental allergies.  Psychiatric/Behavioral: The patient is nervous/anxious.     Objective:  Physical Exam: Filed Vitals:   08/15/15 1402  BP: 182/121  Pulse: 102  Temp: 98.2 F (36.8 C)  TempSrc: Oral  Height: 5' 3.5" (1.613 m)  Weight: 226 lb 3.2 oz (102.604 kg)  SpO2: 92%   GENERAL- alert, obese, co-operative, NAD HEENT- Atraumatic, PERRL, EOMI, oral mucosa appears moist, no cervical lymphadenopathy CARDIAC- RRR, Systolic ejection murmur RESP- CTAB, no wheezes or crackles ABDOMEN- Soft, nontender, no guarding or rebound EXTREMITIES- symmetric, no pedal edema. SKIN- Warm, dry, No rash or lesion. PSYCH- Increased emotional lability, easily tearful  Assessment & Plan:

## 2015-08-15 NOTE — Patient Instructions (Addendum)
Today we talked a lot about your breathing and I think the most important first step is to start using daily Symbicort inhaler. This is a maintenance inhaler and you should use it every morning regardless of how your symptoms are feeling. Over time this should decrease how often you need to use your Proventil air inhaler.  I also would like to obtain a chest x-ray today to make sure your pneumonia finished resolving. Your lung sounds were good today and you haven't had fevers and you are coughing up less sputum.  I have sent your prescriptions to the [facility] on [address]. In particular your Symbicort is now there as well as refills for other medications that he might need.  I have sent a message to Golden Hurter our social worker about her case. I will ask her to contact you if she has any insight into available assistance programs for which you might apply.  If you feel your shortness of breath is getting too severe or are feeling very unwell again please call our clinic any day at [phone]. If you're not able to get in touch please get evaluated at another clinic or the emergency department.

## 2015-08-16 NOTE — Assessment & Plan Note (Signed)
Saw [name] on 08/08/15. CD4 count was rechecked at 620. She is doing extremely well on her current therapy-Prezista, norvir, isentress.

## 2015-08-16 NOTE — Assessment & Plan Note (Signed)
Assessment: Pneumonia that has now resolved, with no radiographic evidence of persistent infiltrate in the RML on repeat CXR today. Her symptoms are also much improved and now seem more consistent with her asthma rather than an infection  Plan: Pneumonia resolved on CXR today

## 2015-08-16 NOTE — Assessment & Plan Note (Signed)
Dysuria is resolved after taking antibiotics. Resolved.

## 2015-08-16 NOTE — Assessment & Plan Note (Signed)
BP Readings from Last 3 Encounters:  08/15/15 182/121  08/08/15 174/120  07/29/15 198/105    Lab Results  Component Value Date   NA 139 07/11/2015   K 4.2 07/11/2015   CREATININE 1.10* 07/11/2015    Assessment: Blood pressure control: Uncontrolled Progress toward BP goal:  No progress Comments: She has significant elevation in blood pressure and is currently on losartan '100mg'$  and nifedipine '10mg'$ . There may be a component of chronic hypoxia from her untreated asthma contributing. Other modifiable factors currently include weight gain, excessive dietary salt, and lack of exercise. Her blood pressure was much better controlled during her recent hospitalization.  Plan: Medications:  continue current medications Other plans: Referral to social work since her transportation and income are currently some barriers to her medications and eating an appropriate diet. We will also reassess her progress and consider change in medications probably once her dyspnea is getting better controlled.

## 2015-08-16 NOTE — Assessment & Plan Note (Signed)
Assessment: Intervention Frequency Notes Meaningful Use ACO  Assessment of Day & Night time symptoms Yearly      Asthma History 08/16/2015  Symptoms Daily  Nighttime Awakenings >1/wk but not nightly  Asthma interference with normal activity Some limitations  SABA use (not for EIB) Several times/day  Risk: Exacerbations requiring oral systemic steroids 0-1 / year  Asthma Severity Moderate Persistent   Her symptoms are consistent with at least moderate persistent asthma causing significant daily episodes of dyspnea. Right now she is using only SABA therapy consistently. Chest radiography is pretty consistent and does not show significant structural abnormality. She may also have a component of sleep apnea or a restrictive defect that is worsening her symptoms but before evaluating alternate factors she definitely needs to be on appropriate asthma therapy, LABA+ICS and SABA.   Plan: Start symbicort maintenance inhaler 2 puffs daily with spacer Continue albuterol PRN Reassess albuterol requirements/symptom frequency at follow up

## 2015-08-19 NOTE — Progress Notes (Signed)
I saw and evaluated the patient.  I personally confirmed the key portions of Dr. Marveen Reeks history and exam and reviewed pertinent patient test results.  The assessment, diagnosis, and plan were formulated together and I agree with the documentation in the resident's note.

## 2015-08-21 ENCOUNTER — Telehealth: Payer: Self-pay | Admitting: Licensed Clinical Social Worker

## 2015-08-21 NOTE — Telephone Encounter (Signed)
CSW received call from Ms. Rademaker inquiring about local resources.  Pt states transportation and healthy foods are obstacles at this time.  Ms. Darrington recently moved to [place] area from larger metropolitan areas; such as [place] and [place].  Pt has recently had a decrease in her food stamps and possibly her disability check.  Ms. Goosby states transportation to local food pantries has been a major obstacle.  Pt is more familiar with being able to walk to local community resources.  Pt states she is having a difficulty time finding resources for the HIV population.  CSW had lengthy discussion with Ms. Washburn and provided local community resource number over the phone and pt aware CSW will mail additional information.  Please see letter regarding resources pt provided.

## 2015-08-28 ENCOUNTER — Telehealth: Payer: Self-pay | Admitting: Licensed Clinical Social Worker

## 2015-08-28 ENCOUNTER — Encounter: Payer: Self-pay | Admitting: *Deleted

## 2015-08-28 NOTE — Telephone Encounter (Signed)
CSW received voice mail from Ms. Lappe.  Pt requesting CSW to initiate Part B of [employer] application for transportation assistance for grocery shopping.

## 2015-08-29 MED ORDER — LOSARTAN POTASSIUM 100 MG PO TABS: 100.0000 mg | ORAL_TABLET | Freq: Every day | ORAL | Status: DC

## 2015-08-29 MED ORDER — RALTEGRAVIR POTASSIUM 400 MG PO TABS: 400.0000 mg | ORAL_TABLET | Freq: Two times a day (BID) | ORAL | Status: DC

## 2015-08-29 MED ORDER — ZINC PO LOZG: 1.0000 | LOZENGE | Freq: Every day | ORAL | Status: DC

## 2015-08-29 MED ORDER — CYANOCOBALAMIN 1000 MCG/ML IJ SOLN: 1000.0000 ug | INTRAMUSCULAR | Status: DC

## 2015-08-29 MED ORDER — NIFEDIPINE 10 MG PO CAPS: 10.0000 mg | ORAL_CAPSULE | Freq: Every day | ORAL | Status: DC

## 2015-08-29 MED ORDER — OMEPRAZOLE 40 MG PO CPDR: 40.0000 mg | DELAYED_RELEASE_CAPSULE | Freq: Every day | ORAL | Status: DC

## 2015-08-29 MED ORDER — ADULT MULTIVITAMIN W/MINERALS CH: 1.0000 | ORAL_TABLET | Freq: Every day | ORAL | Status: DC

## 2015-08-29 MED ORDER — VITAMIN C 500 MG PO TABS: 1000.0000 mg | ORAL_TABLET | Freq: Every day | ORAL | Status: DC

## 2015-08-29 MED ORDER — ALBUTEROL SULFATE HFA 108 (90 BASE) MCG/ACT IN AERS: 2.0000 | INHALATION_SPRAY | Freq: Four times a day (QID) | RESPIRATORY_TRACT | Status: DC | PRN

## 2015-08-29 MED ORDER — "SYRINGE 25G X 5/8"" 3 ML MISC": 1.0000 | Status: DC

## 2015-08-29 NOTE — Addendum Note (Signed)
Addended by: Collier Salina on: 08/29/2015 03:30 PM   Modules accepted: Orders

## 2015-08-29 NOTE — Telephone Encounter (Signed)
CSW placed called to pt.  CSW left message requesting return call. Pt notified this worker will place SCAT A application and ROI from Part B in mail for pt to return to this worker.  CSW provided contact hours and phone number.

## 2015-08-30 NOTE — Telephone Encounter (Signed)
Part A and ROI mailed to patient with return envelope.

## 2015-09-12 ENCOUNTER — Ambulatory Visit: Payer: PRIVATE HEALTH INSURANCE | Admitting: Internal Medicine

## 2015-09-18 ENCOUNTER — Encounter: Payer: Self-pay | Admitting: Licensed Clinical Social Worker

## 2015-09-18 NOTE — Progress Notes (Signed)
Patient ID: Shannon [name], female   DOB: 12-29-1955, 60 y.o.   MRN: [id number] CSW received pt's portion of SCAT application.  Part B completed and faxed to  [employer].  Pt notified.

## 2015-09-24 ENCOUNTER — Telehealth: Payer: Self-pay | Admitting: Dietician

## 2015-09-25 ENCOUNTER — Encounter: Payer: Self-pay | Admitting: Infectious Diseases

## 2015-09-25 ENCOUNTER — Ambulatory Visit (INDEPENDENT_AMBULATORY_CARE_PROVIDER_SITE_OTHER): Payer: Medicare Other | Admitting: Infectious Diseases

## 2015-09-25 VITALS — BP 181/94 | HR 105 | Temp 97.9°F | Wt 218.0 lb

## 2015-09-25 DIAGNOSIS — Z113 Encounter for screening for infections with a predominantly sexual mode of transmission: Secondary | ICD-10-CM

## 2015-09-25 DIAGNOSIS — E538 Deficiency of other specified B group vitamins: Secondary | ICD-10-CM

## 2015-09-25 DIAGNOSIS — B20 Human immunodeficiency virus [HIV] disease: Secondary | ICD-10-CM | POA: Diagnosis not present

## 2015-09-25 DIAGNOSIS — I10 Essential (primary) hypertension: Secondary | ICD-10-CM | POA: Diagnosis not present

## 2015-09-25 DIAGNOSIS — Z1239 Encounter for other screening for malignant neoplasm of breast: Secondary | ICD-10-CM

## 2015-09-25 DIAGNOSIS — J4541 Moderate persistent asthma with (acute) exacerbation: Secondary | ICD-10-CM | POA: Diagnosis not present

## 2015-09-25 DIAGNOSIS — Z79899 Other long term (current) drug therapy: Secondary | ICD-10-CM

## 2015-09-25 DIAGNOSIS — Z139 Encounter for screening, unspecified: Secondary | ICD-10-CM

## 2015-09-25 MED ORDER — SYRINGE (DISPOSABLE) 1 ML MISC: 1.0000 [IU] | Status: DC

## 2015-09-25 MED ORDER — ENSURE PO LIQD: 237.0000 mL | Freq: Two times a day (BID) | ORAL | Status: DC

## 2015-09-25 NOTE — Progress Notes (Signed)
   Subjective:    Patient ID: Shannon [name], female    DOB: 1955-10-16, 60 y.o.   MRN: [id number]  HPI 60 yo F with HIV/AIDs dx in early 1990s. She has been maintained on DRVr/TRV most recently. She had a transient dip in her CD4 last fall related to an acute illness (pneumonia) Her fatigue has been better.  She also has a hx of HTN- feels like this has gone up since on symbicort. Has had headaches, chest pain (had cath 20115, negative).    She has a hx of asthma- was put on symbicort and has improved dramatically. Has eliminated use of prn inhaler. It has made her "ravenous appetite". Has pulmonary eval pending for sleep apnea.   Still losing muscle mass, worried about lipodystrophy. Having muscle aches- low back pain and legs due to abd girth. Has quit salt and sugar. Has limited resources, gets food stamps.  Has lost 8#. Feels better.   Having night sweats, light dizziness.  Has nephro eval pending, has "3 cysts on her kidneys" Sleeping poorly.   CD4 T CELL ABS (/uL)  Date Value  08/08/2015 620  07/09/2015 140*   Refuses influenza, pneumovax  Review of Systems Please see HPI. 12 point ROS o/w (-)     Objective:   Physical Exam  Constitutional: She appears well-developed and well-nourished.  HENT:  Mouth/Throat: No oropharyngeal exudate.  Eyes: EOM are normal. Pupils are equal, round, and reactive to light.  Neck: Neck supple.  Cardiovascular: Normal rate, regular rhythm and normal heart sounds.   Pulmonary/Chest: Effort normal and breath sounds normal.  Abdominal: Soft. Bowel sounds are normal. There is no tenderness. There is no rebound.  Musculoskeletal: She exhibits no edema.  Lymphadenopathy:    She has no cervical adenopathy.      Assessment & Plan:

## 2015-09-25 NOTE — Addendum Note (Signed)
Addended by: Clarnce Flock E on: 09/25/2015 11:55 AM   Modules accepted: Orders

## 2015-09-25 NOTE — Assessment & Plan Note (Signed)
She has mild sx.  She has f/u with her PCP next week.

## 2015-09-25 NOTE — Assessment & Plan Note (Signed)
Will get her in with pulm to see about sleep study and if she can get alternate rx (off symbicort).

## 2015-09-25 NOTE — Assessment & Plan Note (Addendum)
She is doing well.  No change in meds She refuses flu and pnvx Offered/refused condoms.  She had pap, mammo last year at PCP in [place].  Will defer to her PCP to set this up Will see her back in 6 months

## 2015-09-26 ENCOUNTER — Other Ambulatory Visit: Payer: Self-pay | Admitting: Infectious Diseases

## 2015-09-26 DIAGNOSIS — Z1231 Encounter for screening mammogram for malignant neoplasm of breast: Secondary | ICD-10-CM

## 2015-09-26 NOTE — Telephone Encounter (Signed)
Patient called to schedule an appointment. 

## 2015-10-01 ENCOUNTER — Telehealth: Payer: Self-pay | Admitting: Internal Medicine

## 2015-10-01 NOTE — Telephone Encounter (Signed)
APPT REMINDER CALL/LMTCB IF SHE NEEDS TO CANCEL °

## 2015-10-02 ENCOUNTER — Ambulatory Visit (INDEPENDENT_AMBULATORY_CARE_PROVIDER_SITE_OTHER): Payer: Medicare Other | Admitting: Internal Medicine

## 2015-10-02 ENCOUNTER — Encounter: Payer: Self-pay | Admitting: Internal Medicine

## 2015-10-02 ENCOUNTER — Ambulatory Visit (INDEPENDENT_AMBULATORY_CARE_PROVIDER_SITE_OTHER): Payer: Medicare Other | Admitting: Dietician

## 2015-10-02 VITALS — BP 172/101 | HR 98 | Temp 98.2°F | Ht 63.5 in | Wt 224.8 lb

## 2015-10-02 DIAGNOSIS — J4540 Moderate persistent asthma, uncomplicated: Secondary | ICD-10-CM

## 2015-10-02 DIAGNOSIS — E538 Deficiency of other specified B group vitamins: Secondary | ICD-10-CM | POA: Diagnosis not present

## 2015-10-02 DIAGNOSIS — I10 Essential (primary) hypertension: Secondary | ICD-10-CM

## 2015-10-02 DIAGNOSIS — Z713 Dietary counseling and surveillance: Secondary | ICD-10-CM

## 2015-10-02 DIAGNOSIS — Z724 Inappropriate diet and eating habits: Secondary | ICD-10-CM | POA: Diagnosis not present

## 2015-10-02 MED ORDER — "SYRINGE/NEEDLE (DISP) 25G X 5/8"" 1 ML MISC": 1.0000 [IU] | Status: DC

## 2015-10-02 MED FILL — NIFEdipine 10 MG CAPS: 10 | 30 days supply | Qty: 30 | Fill #0

## 2015-10-02 NOTE — Patient Instructions (Signed)
General Instructions:  Please bring your medications to your next visit with Dr Benjamine Mola.  Please bring your medicines with you each time you come to clinic.  Medicines may include prescription medications, over-the-counter medications, herbal remedies, eye drops, vitamins, or other pills.   Progress Toward Treatment Goals:  No flowsheet data found.  Self Care Goals & Plans:  Self Care Goal 08/15/2015  Manage my medications take my medicines as prescribed; bring my medications to every visit; refill my medications on time  Monitor my health -  Eat healthy foods eat more vegetables; eat foods that are low in salt; eat baked foods instead of fried foods  Be physically active find an activity I enjoy    No flowsheet data found.   Care Management & Community Referrals:  No flowsheet data found.

## 2015-10-02 NOTE — Assessment & Plan Note (Signed)
HPI:  She has been taking symbicort which has helped reduce the use of her albuterol.  She is having night awakening and wants a sleep study.  She also wants to see a pulmonologsit for a sleep study and to discuss alternatives to symbicort.  She was referred last week to pulmonology by Dr Johnnye Sima but has yet to hear about an appointment. She also reports increased productive cough, she has picked up a prescription she had at the drug store for bactrim and has started taking this (? Bactrim that was prescribed in December for UTI)  A: Moderate persistent asthma - Recommend continue Symbicort - Doubt Bactrim is effective given her normal CD4.  She currently has no fever, I tried to discuss that this is likely ineffective but suspect Shannon [name] will finish her course.

## 2015-10-02 NOTE — Patient Instructions (Addendum)
Healthier food choices:  Less sugar added to foods Old Fashioned oatmeal costs less and is more nutritious 1% milk instead of 2% milk Cheese is healthier than butter, lowfat cheese like made with 2% milk   Measure my foods and learn what a portion is for example-   3 teaspoons are in 1 tablespoon  Trying to find ways to be more active during my usual routine          Walking to bus stop          Maybe get off the stop earlier when you feel better  Bus tickets- show MEDICARE your Medicare card  Good sources of protein: eggs,  Chicken, [place], tofu, fish, quinoa- you are doing great- could consider trying to get more protein at breakfast  BMI for Adults Body mass index (BMI) is a number that is calculated from a person's weight and height. In most adults, the number is used to find how much of an adult's weight is made up of fat. BMI is not as accurate as a direct measure of body fat. HOW IS BMI CALCULATED? BMI is calculated by dividing weight in kilograms by height in meters squared. It can also be calculated by dividing weight in pounds by height in inches squared, then multiplying the resulting number by 703. Charts are available to help you find your BMI quickly and easily without doing this calculation.  HOW IS BMI INTERPRETED? Health care professionals use BMI charts to identify whether an adult is underweight, at a normal weight, or overweight based on the following guidelines:  Underweight: BMI less than 18.5.  Normal weight: BMI between 18.5 and 24.9.  Overweight: BMI between 25 and 29.9.  Obese: BMI of 30 and above. BMI is usually interpreted the same for males and females. Weight includes both fat and muscle, so someone with a muscular build, such as an athlete, may have a BMI that is higher than 24.9. In cases like these, BMI may not accurately depict body fat. To determine if excess body fat is the cause of a BMI of 25 or higher, further assessments may need to be done by  a health care provider. WHY IS BMI A USEFUL TOOL? BMI is used to identify a possible weight problem that may be related to a medical problem or may increase the risk for medical problems. BMI can also be used to promote changes to reach a healthy weight.   Your BMI today was 39.   Vitamins and minerals   You don't need more/extra  vitamin  E or Zinc- they are already in your multivitamin in reasonable amounts. You do not need more and more can do harm.

## 2015-10-02 NOTE — Assessment & Plan Note (Signed)
HPI: She apparently has been on parenternal B12 injections for years.  She does not know of any malabsorption issues.  She does take oral supplementation as well.  She is requesting 1cc syringes for her injections instead of 3cc.  A: Vitamin B12 deficiency  P: -Prescribed syringes - Check B12 levels.

## 2015-10-02 NOTE — Progress Notes (Signed)
[facility] Subjective:   Patient ID: Shannon [name] female   DOB: [birth year] 60 y.o.   MRN: [id number]  HPI: Shannon.Shannon [name] is a 60 y.o. female with a PMH detailed below who presents for 1 month follow up of HTN.  Please see problem based charting below for the status of her chronic medical problems.    Past Medical History  Diagnosis Date  . HIV antibody positive ([place])   . Hypertension   . Hyperlipidemia   . Heart murmur   . Chronic asthma with acute exacerbation     "I have chronic asthma all the time; sometimes exacerbations" (07/09/2015)  . Pneumonia 07/09/2015  . Sleep apnea     "never completed part 2 of study; never wore mask" (07/09/2015)  . Anemia   . History of blood transfusion     "related to my brain surgery I think"  . HIV disease ([place])   . GERD (gastroesophageal reflux disease)   . Arthritis     "starting to; in my hands" (07/09/2015)  . Chronic lower back pain   . Anxiety   . Cyst of right kidney     "3 of them; dx'd in ~ 01/2015"   Current Outpatient Prescriptions  Medication Sig Dispense Refill  . albuterol (PROAIR HFA) 108 (90 Base) MCG/ACT inhaler Inhale 2 puffs into the lungs every 6 (six) hours as needed for wheezing or shortness of breath. (Patient not taking: Reported on 09/25/2015) 6.7 g 0  . ALPRAZolam (XANAX) 0.5 MG tablet Take 1 tablet (0.5 mg total) by mouth at bedtime. 90 tablet 0  . budesonide-formoterol (SYMBICORT) 80-4.5 MCG/ACT inhaler Inhale 2 puffs into the lungs daily. 1 Inhaler 12  . Coenzyme Q10 (COQ10 PO) Take 1 tablet by mouth daily.    . cyanocobalamin (,VITAMIN B-12,) 1000 MCG/ML injection Inject 1 mL (1,000 mcg total) into the muscle every 30 (thirty) days. 1 mL 3  . Darunavir Ethanolate (PREZISTA) 800 MG tablet Take 1 tablet (800 mg total) by mouth daily with breakfast. 90 tablet 0  . ENSURE (ENSURE) Take 237 mLs by mouth 2 (two) times daily between meals. 237 mL 11  . ezetimibe (ZETIA) 10 MG tablet Take 1  tablet (10 mg total) by mouth daily. 90 tablet 0  . losartan (COZAAR) 100 MG tablet Take 1 tablet (100 mg total) by mouth daily. 90 tablet 0  . Multiple Vitamin (MULTIVITAMIN WITH MINERALS) TABS tablet Take 1 tablet by mouth daily. 90 tablet   . Multiple Vitamins-Minerals (ZINC) LOZG Take 1 lozenge by mouth daily. 100 lozenge 0  . NIFEdipine (PROCARDIA) 10 MG capsule Take 1 capsule (10 mg total) by mouth daily. 90 capsule 0  . omeprazole (PRILOSEC) 40 MG capsule Take 1 capsule (40 mg total) by mouth daily. 90 capsule 0  . POTASSIUM PO Take 1 tablet by mouth daily.    . raltegravir (ISENTRESS) 400 MG tablet Take 1 tablet (400 mg total) by mouth 2 (two) times daily. 180 tablet 0  . ritonavir (NORVIR) 100 MG TABS tablet Take 1 tablet (100 mg total) by mouth daily with breakfast. 90 tablet 1  . SYRINGE/NEEDLE, DISP, 1 ML (B-D SYRINGE/NEEDLE 1CC/25GX5/8) 25G X 5/8" 1 ML MISC 1 Units by Does not apply route every 30 (thirty) days. 50 each 0  . vitamin C (ASCORBIC ACID) 500 MG tablet Take 2 tablets (1,000 mg total) by mouth daily. 180 tablet 0   No current facility-administered medications for this visit.   Family History  Problem Relation Age of Onset  . Asthma Mother   . Heart failure Mother     cardiomyopathy  . Heart murmur Sister    Social History   Social History  . Marital Status: Divorced    Spouse Name: N/A  . Number of Children: N/A  . Years of Education: N/A   Social History Main Topics  . Smoking status: Never Smoker   . Smokeless tobacco: Never Used  . Alcohol Use: 0.0 oz/week    0 Standard drinks or equivalent per week     Comment: Rarely.  . Drug Use: No  . Sexual Activity:    Partners: Male   Other Topics Concern  . None   Social History Narrative   Review of Systems: Review of Systems  Constitutional: Positive for weight loss. Negative for fever and chills.  Respiratory: Positive for cough and sputum production. Negative for hemoptysis, shortness of breath and  wheezing.   Cardiovascular: Negative for chest pain.  Gastrointestinal: Negative for abdominal pain.  All other systems reviewed and are negative.   Objective:  Physical Exam: Filed Vitals:   10/02/15 1021  BP: 172/101  Pulse: 98  Temp: 98.2 F (36.8 C)  TempSrc: Oral  Height: 5' 3.5" (1.613 m)  Weight: 224 lb 12.8 oz (101.969 kg)  SpO2: 98%  Physical Exam  Constitutional: She is well-developed, well-nourished, and in no distress.  Pulmonary/Chest: Effort normal and breath sounds normal. She has no wheezes.  Psychiatric:  Pressured speech  Nursing note and vitals reviewed.   Assessment & Plan:  Case discussed with Dr. Evette Doffing  Asthma, moderate persistent HPI:  She has been taking symbicort which has helped reduce the use of her albuterol.  She is having night awakening and wants a sleep study.  She also wants to see a pulmonologsit for a sleep study and to discuss alternatives to symbicort.  She was referred last week to pulmonology by Dr Johnnye Sima but has yet to hear about an appointment. She also reports increased productive cough, she has picked up a prescription she had at the drug store for bactrim and has started taking this (? Bactrim that was prescribed in December for UTI)  A: Moderate persistent asthma - Recommend continue Symbicort - Doubt Bactrim is effective given her normal CD4.  She currently has no fever, I tried to discuss that this is likely ineffective but suspect Shannon [name] will finish her course.  Vitamin B12 deficiency HPI: She apparently has been on parenternal B12 injections for years.  She does not know of any malabsorption issues.  She does take oral supplementation as well.  She is requesting 1cc syringes for her injections instead of 3cc.  A: Vitamin B12 deficiency  P: -Prescribed syringes - Check B12 levels.  Essential hypertension HPI: Her blood pressure has been very poorly controlled, she is prescribed losartan and nifedipine. Our pharmacist  have contacted her pharmacy and she has not filled either prescription from 08/29/15.  She reports to me that she is taking losartan but not nifedipine.  She denies any side effects or cost concerns.  She feels that her blood pressure is high because she is on steroids (by this she means the symbicort)  A: Essential Hypertension, uncontrolled  P: - Educated about importance of BP control -Attempted to educate about symbicort and low dose inhaled corticosteroids not having effect on blood pressure, I do not think i was heard. - Asked our clinical pharmacist to see patient to assess what barriers we have  to treatment - Will have close follow up with PCP until blood pressure is better controlled.    Medications Ordered Meds ordered this encounter  Medications  . SYRINGE/NEEDLE, DISP, 1 ML (B-D SYRINGE/NEEDLE 1CC/25GX5/8) 25G X 5/8" 1 ML MISC    Sig: 1 Units by Does not apply route every 30 (thirty) days.    Dispense:  50 each    Refill:  0   Other Orders Orders Placed This Encounter  Procedures  . Vitamin B12   Follow Up: Return 2-4 weeks with Dr Benjamine Mola.

## 2015-10-02 NOTE — Progress Notes (Signed)
  Medical Nutrition Therapy:  Appt start time: 1100 end time:  1230. 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 She has already many healthy changes to her food intake. She takes many Over the counter vitamins, minerals and supplements, some are duplicates putting her at risk for excess. She is on a limited budget, but does very well accommodating healthier choices.  Preferred Learning Style: No preference indicated   Learning Readiness: Ready and  Change in progress  ANTHROPOMETRICS: weight-224.8#, height-63", BMI-39- obese class II WEIGHT HISTORY:says at one time her BMI was 51, has always been obese SLEEP:working on her sleep, has difficulty MEDICATIONS: noted- HIV medications, gives herself vitamin B12 injections monthly LABS:Lipid profile pending, glucose has been as high as 172 DIETARY INTAKE: Usual eating pattern includes 3 meals and 2 snacks per day. Everyday foods include cheese, instant oatmeal, tea, juice, butter, olive oil.  Avoided foods include no processed meats, cheese, refined grains or red meat.   24-hr recall: (~2000 calories/day and 110 g protein)  B ( 1030 AM): 1.5 packs oatmeal, 1/4 cup 2% milk, 3-5 tsp sugar, sometimes juice and tea (300-400 calories) L (2 PM): ~ 1 cup homemade yams, steamed cabbage 2 cups, 2 [place] wings little skin, 6 oz juice(~ 800-900 calories)  D (5-6 PM): leftovers but ~ 1/2 portions(400-500 calories)  Snk ( 9-10PM): small portion of spaghetti with ground turkey(~200-400 calories) Beverages: water, juice, tea  Usual physical activity: ADLs,  just got her [employer] sneakers card and plans to start as soon as her lungs feel better  Estimated energy needs: [phone] Calories/day to maintain weight.  ~1400 Calories/day to lose 1 lb per week. 120 g  protein(34%)~ 1.2g/kg/current bodyweight, 140g carb(40%), 40 g fat(26%)  Progress Towards Goal(s):  In progress.   Nutritional Diagnosis:  NB-1.1 Food and nutrition-related knowledge deficit As related to lack of prior dietary education.  As evidenced by her report, questions and concerns.    Intervention:  Nutrition education about nutritional supplements, balanced meal planning for fat loss increased nutrient density, activity education for muscle building. Coordination of care:   Teaching Method Utilized: Visual, auditory and Hands on  Handouts given during visit include:your everyday guide to physical activity and meal planning booklet as well as AVS Barriers to learning/adherence to lifestyle change: transportation, finances, support Demonstrated degree of understanding via:  Teach Back   Monitoring/Evaluation:  Dietary intake, exercise, and body weight in 4 week(s).

## 2015-10-02 NOTE — Assessment & Plan Note (Signed)
HPI: Her blood pressure has been very poorly controlled, she is prescribed losartan and nifedipine. Our pharmacist have contacted her pharmacy and she has not filled either prescription from 08/29/15.  She reports to me that she is taking losartan but not nifedipine.  She denies any side effects or cost concerns.  She feels that her blood pressure is high because she is on steroids (by this she means the symbicort)  A: Essential Hypertension, uncontrolled  P: - Educated about importance of BP control -Attempted to educate about symbicort and low dose inhaled corticosteroids not having effect on blood pressure, I do not think i was heard. - Asked our clinical pharmacist to see patient to assess what barriers we have to treatment - Will have close follow up with PCP until blood pressure is better controlled.

## 2015-10-03 ENCOUNTER — Other Ambulatory Visit: Payer: Self-pay | Admitting: Internal Medicine

## 2015-10-03 LAB — VITAMIN B12: VITAMIN B 12: 1183 pg/mL — AB (ref 211–946)

## 2015-10-03 NOTE — Progress Notes (Signed)
Internal Medicine Clinic Attending  Case discussed with Dr. Hoffman at the time of the visit.  We reviewed the resident's history and exam and pertinent patient test results.  I agree with the assessment, diagnosis, and plan of care documented in the resident's note.  

## 2015-10-10 ENCOUNTER — Encounter: Payer: Self-pay | Admitting: Pulmonary Disease

## 2015-10-10 ENCOUNTER — Ambulatory Visit (INDEPENDENT_AMBULATORY_CARE_PROVIDER_SITE_OTHER): Payer: Medicare Other | Admitting: Pulmonary Disease

## 2015-10-10 VITALS — BP 142/108 | HR 95 | Ht 63.5 in | Wt 224.8 lb

## 2015-10-10 DIAGNOSIS — E669 Obesity, unspecified: Secondary | ICD-10-CM

## 2015-10-10 DIAGNOSIS — G4733 Obstructive sleep apnea (adult) (pediatric): Secondary | ICD-10-CM | POA: Diagnosis not present

## 2015-10-10 DIAGNOSIS — J189 Pneumonia, unspecified organism: Secondary | ICD-10-CM | POA: Diagnosis not present

## 2015-10-10 DIAGNOSIS — J4540 Moderate persistent asthma, uncomplicated: Secondary | ICD-10-CM | POA: Diagnosis not present

## 2015-10-10 DIAGNOSIS — R0600 Dyspnea, unspecified: Secondary | ICD-10-CM | POA: Diagnosis not present

## 2015-10-10 MED ORDER — ALBUTEROL SULFATE HFA 108 (90 BASE) MCG/ACT IN AERS
2.0000 | INHALATION_SPRAY | Freq: Four times a day (QID) | RESPIRATORY_TRACT | Status: DC | PRN
Start: 2015-10-10 — End: 2016-03-27

## 2015-10-10 MED ORDER — BUDESONIDE-FORMOTEROL FUMARATE 160-4.5 MCG/ACT IN AERO: 2.0000 | INHALATION_SPRAY | Freq: Two times a day (BID) | RESPIRATORY_TRACT | Status: DC

## 2015-10-10 NOTE — Assessment & Plan Note (Signed)
Patient had pneumonia in October 2016 which seemed to have lingered for a couple months. She took Levaquin back in October. She had Bactrim again in January. Chest x-ray in November 2016 and December 2016 showed right lower lobe infiltrate. -- Images were personally reviewed. -- Patient has HIV. She has infiltrate at the right lower lobe. Her pneumonia seemed to have been protracted. She has dyspnea. Plan to get a chest CT scan to better look at the parenchyma. May have underlying opportunistic infection given her HIV.

## 2015-10-10 NOTE — Assessment & Plan Note (Signed)
Patient with recent dyspnea. Had pneumonia in October 2016. Chest x-ray with right lower lobe infiltrate, persistent. --Plan to get chest CT scan. --May need 2-D echo if chest CT scan is unremarkable. Need to rule out pulmonary hypertension.

## 2015-10-10 NOTE — Patient Instructions (Signed)
  1. We will schedule you for a home sleep study, breathing test, and a chest ct scan. 2. We will change your symbicort to 160/4.5 2 puffs 2x/d. 3. Cont proair 2 puffs every 4 hrs as needed.   Return to clinic in 6 weeks   J. Angelo A. Corrie Dandy, MD Pulmonary and [place] [phone], Fax: [phone]

## 2015-10-10 NOTE — Assessment & Plan Note (Signed)
Patient was diagnosed with obstructive sleep apnea more than 10 years ago. She has hypersomnia, snoring, gasping, choking, fatigue. She had a sleep study done in [place]. She never had a CPAP  Study done. Her hypersomnia has gotten worse. Hypersomnia affects her functionality. -- Plan for home sleep study. Patient was advised to call if no one has called her regarding the home sleep study in a week. -- Advised on good sleep hygiene. -- Advised not to engage in activities requiring vigilance if she is sleepy.

## 2015-10-10 NOTE — Assessment & Plan Note (Signed)
Patient is a nonsmoker. Dxed with asthma when she was in her 59s. Triggers: extreme change in weather. Her asthma has beed unstable the last 4 mos related to the weather. She had cough, congestion and SOB. She moved from [place] to [place] in Oct 2016. She was admitted at [facility] in 06/2015 for PNA. She took long to recover from PNA. She took bactrim for 5 days recently 2 to recurrence os sx. Not on prednisone recently.  She uses Symbicort 80/4.5 2 puffs daily. She has been using albuterol 2 puffs daily. Recent prednisone. -- We extensively discussed medicine for her asthma. She is on a lot of medicines and did not necessarily want to use more medicine. She uses Symbicort, 80/4.5 2 puffs daily. I told her she should try to use it 2 puffs twice a day. She does not want to use it at night. We decided to increase her dose to 160/4.5 2 puffs daily. I told her this is not optimal but since she is more symptomatic, we'll try to do this. -- Continue pro-air 2 puffs every 4 hours as needed. -- Needs PFT. --We will need ABG on follow-up. -- We'll need to ask vaccines on follow-up.

## 2015-10-10 NOTE — Progress Notes (Signed)
Subjective:    Patient ID: Shannon [name], female    DOB: Feb 13, 1956, 60 y.o.   MRN: [id number]  HPI   This is the case of Shannon [name], 60 y.o. Female, who was referred by Dr. Verlin Grills  in consultation regarding OSA and SOB/asthma.   As you very well know, patient is a non smoker.   Dxed with asthma when she was in her 69s. Triggers: extreme change in weather. Her asthma has beed unstable the last 4 mos related to the weather. She had cough, congestion and SOB. She moved from [place] to [place] in Oct 2016. She was admitted at [facility] in 06/2015 for PNA. She took long to recover from PNA. She took bactrim for 5 days recently 2 to recurrence os sx. Not on prednisone recently.  She uses Symbicort 80/4.5 2 puffs daily. She has been using albuterol 2 puffs daily. Recent prednisone.  Patient was diagnosed with obstructive sleep apnea more than 10 years ago. She has hypersomnia, snoring, gasping, choking, fatigue. She had a sleep study done in [place]. She never had a CPAP  Study done. Her hypersomnia has gotten worse. Hypersomnia affects her functionality.  She has been HIV (+) for 20 yrs. she does not know where she got it from. She is actively seeing an infectious disease doctor. No complications from HIV. Her CD4 count is in the 600+ and she mentioned that her viral load is undetectable.    Review of Systems  Constitutional: Negative.  Negative for fever and unexpected weight change.  HENT: Negative for congestion, dental problem, ear pain, nosebleeds, postnasal drip, rhinorrhea, sinus pressure, sneezing, sore throat and trouble swallowing.   Eyes: Positive for redness and itching.  Respiratory: Positive for chest tightness, shortness of breath and wheezing. Negative for cough.   Cardiovascular: Negative.  Negative for palpitations and leg swelling.  Gastrointestinal: Negative.  Negative for nausea and vomiting.  Endocrine: Negative.   Genitourinary: Negative.  Negative for dysuria.    Musculoskeletal: Positive for arthralgias. Negative for joint swelling.  Skin: Negative.  Negative for rash.  Allergic/Immunologic: Negative.   Neurological: Positive for headaches.  Hematological: Negative.  Does not bruise/bleed easily.  Psychiatric/Behavioral: Negative.  Negative for dysphoric mood. The patient is not nervous/anxious.    Past Medical History  Diagnosis Date  . HIV antibody positive ([place])   . Hypertension   . Hyperlipidemia   . Heart murmur   . Chronic asthma with acute exacerbation     "I have chronic asthma all the time; sometimes exacerbations" (07/09/2015)  . Pneumonia 07/09/2015  . Sleep apnea     "never completed part 2 of study; never wore mask" (07/09/2015)  . Anemia   . History of blood transfusion     "related to my brain surgery I think"  . HIV disease ([place])   . GERD (gastroesophageal reflux disease)   . Arthritis     "starting to; in my hands" (07/09/2015)  . Chronic lower back pain   . Anxiety   . Cyst of right kidney     "3 of them; dx'd in ~ 01/2015"     Family History  Problem Relation Age of Onset  . Asthma Mother   . Heart failure Mother     cardiomyopathy  . Heart murmur Sister      Past Surgical History  Procedure Laterality Date  . Cardiac catheterization    . Tonsillectomy and adenoidectomy    . Abdominal hysterectomy      "robotic  laparosopic"  . Brain surgery  1974    "brain tumor; benign; on top of my brain; got a plate in there"    Social History   Social History  . Marital Status: Divorced    Spouse Name: N/A  . Number of Children: N/A  . Years of Education: N/A   Occupational History  . Not on file.   Social History Main Topics  . Smoking status: Never Smoker   . Smokeless tobacco: Never Used  . Alcohol Use: 0.0 oz/week    0 Standard drinks or equivalent per week     Comment: Rarely.  . Drug Use: No  . Sexual Activity:    Partners: Male   Other Topics Concern  . Not on file   Social History  Narrative   nonsmoker. Denies drinking alcohol. She lives with family. Initially she lived in [place], then moved to [place], recently moved to [place] 4 months ago.  Allergies  Allergen Reactions  . Ciprofloxacin Hives     Outpatient Prescriptions Prior to Visit  Medication Sig Dispense Refill  . ALPRAZolam (XANAX) 0.5 MG tablet Take 1 tablet (0.5 mg total) by mouth at bedtime. 90 tablet 0  . Biotin 1000 MCG tablet Take 500 mcg by mouth 1 day or 1 dose. Takes every 2-3 days    . Coenzyme Q10 (COQ10 PO) Take 1 tablet by mouth daily.    . cyanocobalamin (,VITAMIN B-12,) 1000 MCG/ML injection Inject 1 mL (1,000 mcg total) into the muscle every 30 (thirty) days. 1 mL 3  . Darunavir Ethanolate (PREZISTA) 800 MG tablet Take 1 tablet (800 mg total) by mouth daily with breakfast. 90 tablet 0  . ENSURE (ENSURE) Take 237 mLs by mouth 2 (two) times daily between meals. 237 mL 11  . L-Lysine 500 MG CAPS Take 1 capsule by mouth once a week.    . losartan (COZAAR) 100 MG tablet Take 1 tablet (100 mg total) by mouth daily. 90 tablet 0  . Multiple Vitamin (MULTIVITAMIN WITH MINERALS) TABS tablet Take 1 tablet by mouth daily. 90 tablet   . Multiple Vitamins-Minerals (ZINC) LOZG Take 1 lozenge by mouth daily. 100 lozenge 0  . NIFEdipine (PROCARDIA) 10 MG capsule Take 1 capsule (10 mg total) by mouth daily. 90 capsule 0  . omeprazole (PRILOSEC) 40 MG capsule Take 1 capsule (40 mg total) by mouth daily. 90 capsule 0  . POTASSIUM PO Take 1 tablet by mouth daily.    . raltegravir (ISENTRESS) 400 MG tablet Take 1 tablet (400 mg total) by mouth 2 (two) times daily. 180 tablet 0  . ritonavir (NORVIR) 100 MG TABS tablet Take 1 tablet (100 mg total) by mouth daily with breakfast. 90 tablet 1  . SYRINGE/NEEDLE, DISP, 1 ML (B-D SYRINGE/NEEDLE 1CC/25GX5/8) 25G X 5/8" 1 ML MISC 1 Units by Does not apply route every 30 (thirty) days. 50 each 0  . vitamin C (ASCORBIC ACID) 500 MG tablet Take 2 tablets (1,000 mg  total) by mouth daily. 180 tablet 0  . ZETIA 10 MG tablet Take 1 tablet by mouth  daily 90 tablet 0  . albuterol (PROAIR HFA) 108 (90 Base) MCG/ACT inhaler Inhale 2 puffs into the lungs every 6 (six) hours as needed for wheezing or shortness of breath. 6.7 g 0  . budesonide-formoterol (SYMBICORT) 80-4.5 MCG/ACT inhaler Inhale 2 puffs into the lungs daily. 1 Inhaler 12   No facility-administered medications prior to visit.   Meds ordered this encounter  Medications  .  ipratropium-albuterol (DUONEB) 0.5-2.5 (3) MG/3ML SOLN    Sig: Inhale 3 mLs into the lungs every 6 (six) hours as needed.  [name] albuterol (PROAIR HFA) 108 (90 Base) MCG/ACT inhaler    Sig: Inhale 2 puffs into the lungs every 6 (six) hours as needed for wheezing or shortness of breath.    Dispense:  6.7 g    Refill:  3  . budesonide-formoterol (SYMBICORT) 160-4.5 MCG/ACT inhaler    Sig: Inhale 2 puffs into the lungs 2 (two) times daily.    Dispense:  1 Inhaler    Refill:  6          Objective:   Physical Exam  Vitals:  Filed Vitals:   10/10/15 1411  BP: 142/108  Pulse: 95  Height: 5' 3.5" (1.613 m)  Weight: 224 lb 12.8 oz (101.969 kg)  SpO2: 91%    Constitutional/General:  Pleasant, well-nourished, well-developed, not in any distress,  Comfortably seating.  Well kempt  Body mass index is 39.19 kg/(m^2). Wt Readings from Last 3 Encounters:  10/10/15 224 lb 12.8 oz (101.969 kg)  10/02/15 224 lb 12.8 oz (101.969 kg)  09/25/15 218 lb (98.884 kg)    Neck circumference: 17 inches  HEENT: Pupils equal and reactive to light and accommodation. Anicteric sclerae. Normal nasal mucosa.   No oral  lesions,  mouth clear,  oropharynx clear, no postnasal drip. (-) Oral thrush. No dental caries.  Airway - Mallampati class III  Neck: No masses. Midline trachea. No JVD, (-) LAD. (-) bruits appreciated.  Respiratory/Chest: Grossly normal chest. (-) deformity. (-) Accessory muscle use.  Symmetric expansion. (-)  Tenderness on palpation.  Resonant on percussion.  Diminished BS on both lower lung zones. (-) wheezing, , rhonchi Bibasal crackles. (-) egophony  Cardiovascular: Regular rate and  rhythm, heart sounds normal, no murmur or gallops, no peripheral edema  Gastrointestinal:  Normal bowel sounds. Soft, non-tender. No hepatosplenomegaly.  (-) masses.   Musculoskeletal:  Normal muscle tone. Normal gait.   Extremities: Grossly normal. (-) clubbing, cyanosis.  (-) edema  Skin: (-) rash,lesions seen.   Neurological/Psychiatric : alert, oriented to time, place, person. Normal mood and affect      Assessment & Plan:  Asthma, moderate persistent Patient is a nonsmoker. Dxed with asthma when she was in her 64s. Triggers: extreme change in weather. Her asthma has beed unstable the last 4 mos related to the weather. She had cough, congestion and SOB. She moved from [place] to [place] in Oct 2016. She was admitted at [facility] in 06/2015 for PNA. She took long to recover from PNA. She took bactrim for 5 days recently 2 to recurrence os sx. Not on prednisone recently.  She uses Symbicort 80/4.5 2 puffs daily. She has been using albuterol 2 puffs daily. Recent prednisone. -- We extensively discussed medicine for her asthma. She is on a lot of medicines and did not necessarily want to use more medicine. She uses Symbicort, 80/4.5 2 puffs daily. I told her she should try to use it 2 puffs twice a day. She does not want to use it at night. We decided to increase her dose to 160/4.5 2 puffs daily. I told her this is not optimal but since she is more symptomatic, we'll try to do this. -- Continue pro-air 2 puffs every 4 hours as needed. -- Needs PFT. --We will need ABG on follow-up. -- We'll need to ask vaccines on follow-up.   OSA (obstructive sleep apnea) Patient was diagnosed with obstructive sleep  apnea more than 10 years ago. She has hypersomnia, snoring, gasping, choking, fatigue. She had a sleep study done in  [place]. She never had a CPAP  Study done. Her hypersomnia has gotten worse. Hypersomnia affects her functionality. -- Plan for home sleep study. Patient was advised to call if no one has called her regarding the home sleep study in a week. -- Advised on good sleep hygiene. -- Advised not to engage in activities requiring vigilance if she is sleepy.   CAP (community acquired pneumonia) Patient had pneumonia in October 2016 which seemed to have lingered for a couple months. She took Levaquin back in October. She had Bactrim again in January. Chest x-ray in November 2016 and December 2016 showed right lower lobe infiltrate. -- Images were personally reviewed. -- Patient has HIV. She has infiltrate at the right lower lobe. Her pneumonia seemed to have been protracted. She has dyspnea. Plan to get a chest CT scan to better look at the parenchyma. May have underlying opportunistic infection given her HIV.  Dyspnea Patient with recent dyspnea. Had pneumonia in October 2016. Chest x-ray with right lower lobe infiltrate, persistent. --Plan to get chest CT scan. --May need 2-D echo if chest CT scan is unremarkable. Need to rule out pulmonary hypertension.    Thank you very much for letting me participate in this patient's care. Please do not hesitate to give me a call if you have any questions or concerns regarding the treatment plan.   Patient will follow up with me in 4-6 weeks.     Monica Becton, MD Pulmonary and [place] Pager: [phone] Office: [phone], Fax: [phone]

## 2015-10-16 ENCOUNTER — Ambulatory Visit: Payer: PRIVATE HEALTH INSURANCE | Admitting: Internal Medicine

## 2015-10-17 ENCOUNTER — Ambulatory Visit (HOSPITAL_COMMUNITY): Admission: RE | Admit: 2015-10-17 | Payer: Medicare Other | Source: Ambulatory Visit

## 2015-10-20 ENCOUNTER — Other Ambulatory Visit: Payer: Self-pay | Admitting: Internal Medicine

## 2015-10-22 ENCOUNTER — Ambulatory Visit: Payer: PRIVATE HEALTH INSURANCE | Admitting: Internal Medicine

## 2015-10-23 ENCOUNTER — Other Ambulatory Visit: Payer: Self-pay | Admitting: Internal Medicine

## 2015-10-23 NOTE — Telephone Encounter (Signed)
Pr requesting a refill on her bactrim

## 2015-10-23 NOTE — Telephone Encounter (Signed)
This was done today 

## 2015-10-24 ENCOUNTER — Ambulatory Visit: Payer: PRIVATE HEALTH INSURANCE | Admitting: Internal Medicine

## 2015-10-30 ENCOUNTER — Encounter (HOSPITAL_COMMUNITY): Payer: Self-pay

## 2015-10-30 ENCOUNTER — Telehealth: Payer: Self-pay | Admitting: Pulmonary Disease

## 2015-10-30 ENCOUNTER — Telehealth: Payer: Self-pay | Admitting: *Deleted

## 2015-10-30 ENCOUNTER — Ambulatory Visit (HOSPITAL_COMMUNITY)
Admission: RE | Admit: 2015-10-30 | Discharge: 2015-10-30 | Disposition: A | Discharge status: Home / Self Care | Payer: Medicare Other | Source: Ambulatory Visit | Attending: Pulmonary Disease | Admitting: Pulmonary Disease

## 2015-10-30 DIAGNOSIS — J189 Pneumonia, unspecified organism: Secondary | ICD-10-CM | POA: Insufficient documentation

## 2015-10-30 DIAGNOSIS — R0602 Shortness of breath: Secondary | ICD-10-CM | POA: Diagnosis not present

## 2015-10-30 NOTE — Telephone Encounter (Signed)
Called and spoke to pt. Pt c/o persistent cough with light green mucus x 2 weeks and also c/o hoarseness. Pt denies SOB, CP/tightness, f/c/s. Pt is requesting an abx. Pt last seen by Dr. Corrie Dandy on 2.16.17. Dr. Corrie Dandy is unavailable this afternoon - will send to doc of the day.   Dr. Halford Chessman please advise. Thanks.

## 2015-10-30 NOTE — Telephone Encounter (Signed)
lmtcb for pt.  

## 2015-10-30 NOTE — Telephone Encounter (Signed)
Patient returned call and also asks if she can get a copy of her results, CB [phone]

## 2015-10-30 NOTE — Telephone Encounter (Signed)
Can send order for cefitn 500 mg bid, #10 with no refills.

## 2015-10-30 NOTE — Telephone Encounter (Signed)
Spoke with pt. States that she takes meds for HIV and can only tolerate certain antibiotics. Pt would like azithromycin, amoxicillin or ampicillin sent in.  VS - please advise. Thanks.

## 2015-10-30 NOTE — Telephone Encounter (Signed)
She should then call her infectious disease doctor to determine which antibiotic would be appropriate for her.

## 2015-10-30 NOTE — Telephone Encounter (Signed)
Called and spoke to pt. Informed her of the recs per VS. Pt verbalized understanding and denied any further questions or concerns at this time.

## 2015-10-30 NOTE — Telephone Encounter (Signed)
Pt left telephone message.  Dr Juanetta Gosling office advised the pt to call [facility].  Pt c/o persistent cough with light green mucus x 2 weeks and also c/o hoarseness.  States that she takes meds for HIV and can only tolerate certain antibiotics. Pt would like azithromycin, amoxicillin or ampicillin sent in.

## 2015-10-31 ENCOUNTER — Telehealth: Payer: Self-pay | Admitting: Pulmonary Disease

## 2015-10-31 NOTE — Telephone Encounter (Signed)
Called and spoke to pt. Pt states she has not heard back from ID regarding getting an abx. Advised pt that she needs to call back ID and let them know that she has not heard back and is requesting recs. Pt upset that she has not received recs and called yesterday about the issue. Pt aware to contact ID per Dr. Halford Chessman regarding current infection. Nothing further needed at this time.

## 2015-11-01 ENCOUNTER — Other Ambulatory Visit: Payer: Self-pay | Admitting: *Deleted

## 2015-11-01 DIAGNOSIS — J13 Pneumonia due to Streptococcus pneumoniae: Secondary | ICD-10-CM

## 2015-11-01 MED ORDER — AMOXICILLIN-POT CLAVULANATE 875-125 MG PO TABS: 1.0000 | ORAL_TABLET | Freq: Two times a day (BID) | ORAL | Status: DC

## 2015-11-01 NOTE — Telephone Encounter (Signed)
Per verbal order from Dr Johnnye Sima, sent in prescription for augmentin.  Patient notified. She cancelled her work-in appointment 3/13, rescheduled for regular follow up.  Lab orders placed.  Landis Gandy, RN

## 2015-11-04 ENCOUNTER — Ambulatory Visit: Payer: Medicare Other | Admitting: Internal Medicine

## 2015-11-05 ENCOUNTER — Other Ambulatory Visit: Payer: Self-pay | Admitting: *Deleted

## 2015-11-05 DIAGNOSIS — B20 Human immunodeficiency virus [HIV] disease: Secondary | ICD-10-CM

## 2015-11-05 MED ORDER — RITONAVIR 100 MG PO TABS: 100.0000 mg | ORAL_TABLET | Freq: Every day | ORAL | Status: DC

## 2015-11-05 MED ORDER — DARUNAVIR ETHANOLATE 800 MG PO TABS: 800.0000 mg | ORAL_TABLET | Freq: Every day | ORAL | Status: DC

## 2015-11-05 MED ORDER — RALTEGRAVIR POTASSIUM 400 MG PO TABS: 400.0000 mg | ORAL_TABLET | Freq: Two times a day (BID) | ORAL | Status: DC

## 2015-11-05 NOTE — Telephone Encounter (Signed)
Due to pt's insurance pt will be getting her specialty scripts from [place]

## 2015-11-09 ENCOUNTER — Other Ambulatory Visit: Payer: Self-pay | Admitting: Internal Medicine

## 2015-11-11 ENCOUNTER — Telehealth: Payer: Self-pay | Admitting: Pulmonary Disease

## 2015-11-11 NOTE — Telephone Encounter (Signed)
I spoke with Mrs. Speirs and told her when she felt like she was breathing better to call me and we would get the HST rescheduled for her

## 2015-11-11 NOTE — Telephone Encounter (Signed)
Pt supposed to pick up HST machine 3/21 and the patient is needing to reschedule this as she currently has PNA.  Please advise Shannon [name] , thanks.

## 2015-11-13 NOTE — Telephone Encounter (Signed)
Shannon [name], is anything further needed with this message at this time or can it be closed? Thanks.

## 2015-11-14 ENCOUNTER — Other Ambulatory Visit: Payer: Medicare Other

## 2015-11-15 ENCOUNTER — Other Ambulatory Visit: Payer: Medicare Other

## 2015-11-15 DIAGNOSIS — Z113 Encounter for screening for infections with a predominantly sexual mode of transmission: Secondary | ICD-10-CM | POA: Diagnosis not present

## 2015-11-15 DIAGNOSIS — Z79899 Other long term (current) drug therapy: Secondary | ICD-10-CM | POA: Diagnosis not present

## 2015-11-15 DIAGNOSIS — B20 Human immunodeficiency virus [HIV] disease: Secondary | ICD-10-CM

## 2015-11-15 LAB — CBC
HCT: 37.5 % (ref 36.0–46.0)
Hemoglobin: 12.2 g/dL (ref 12.0–15.0)
MCH: 29.6 pg (ref 26.0–34.0)
MCHC: 32.5 g/dL (ref 30.0–36.0)
MCV: 91 fL (ref 78.0–100.0)
MPV: 10.5 fL (ref 8.6–12.4)
PLATELETS: 214 10*3/uL (ref 150–400)
RBC: 4.12 MIL/uL (ref 3.87–5.11)
RDW: 14 % (ref 11.5–15.5)
WBC: 4.7 10*3/uL (ref 4.0–10.5)

## 2015-11-15 LAB — T-HELPER CELL (CD4) - (RCID CLINIC ONLY)
CD4 T CELL ABS: 490 /uL (ref 400–2700)
CD4 T CELL HELPER: 32 % — AB (ref 33–55)

## 2015-11-16 LAB — RPR

## 2015-11-18 LAB — HIV-1 RNA QUANT-NO REFLEX-BLD

## 2015-11-20 LAB — LIPID PANEL
CHOL/HDL RATIO: 2.9 ratio (ref <=5.0)
Cholesterol: 174 mg/dL (ref 125–200)
HDL: 60 mg/dL (ref >=46)
LDL CALC: 99 mg/dL (ref <130)
TRIGLYCERIDES: 75 mg/dL (ref <150)
VLDL: 15 mg/dL (ref <30)

## 2015-11-20 LAB — COMPREHENSIVE METABOLIC PANEL
ALBUMIN: 3.7 g/dL (ref 3.6–5.1)
ALT: 14 U/L (ref 6–29)
AST: 19 U/L (ref 10–35)
Alkaline Phosphatase: 61 U/L (ref 33–130)
BUN: 15 mg/dL (ref 7–25)
CHLORIDE: 104 mmol/L (ref 98–110)
CO2: 31 mmol/L (ref 20–31)
CREATININE: 0.9 mg/dL (ref 0.50–0.99)
Calcium: 9.7 mg/dL (ref 8.6–10.4)
Glucose, Bld: 91 mg/dL (ref 65–99)
POTASSIUM: 3.8 mmol/L (ref 3.5–5.3)
Sodium: 144 mmol/L (ref 135–146)
Total Bilirubin: 0.4 mg/dL (ref 0.2–1.2)
Total Protein: 6.6 g/dL (ref 6.1–8.1)

## 2015-11-27 ENCOUNTER — Ambulatory Visit: Payer: Medicare Other | Admitting: Infectious Diseases

## 2015-11-28 ENCOUNTER — Encounter: Payer: Self-pay | Admitting: Internal Medicine

## 2015-11-28 ENCOUNTER — Ambulatory Visit (INDEPENDENT_AMBULATORY_CARE_PROVIDER_SITE_OTHER): Payer: Medicare Other | Admitting: Internal Medicine

## 2015-11-28 VITALS — BP 189/126 | HR 86 | Temp 98.2°F | Ht 63.5 in | Wt 228.5 lb

## 2015-11-28 DIAGNOSIS — R05 Cough: Secondary | ICD-10-CM | POA: Diagnosis not present

## 2015-11-28 DIAGNOSIS — J45909 Unspecified asthma, uncomplicated: Secondary | ICD-10-CM | POA: Diagnosis not present

## 2015-11-28 DIAGNOSIS — B20 Human immunodeficiency virus [HIV] disease: Secondary | ICD-10-CM | POA: Diagnosis not present

## 2015-11-28 DIAGNOSIS — R0600 Dyspnea, unspecified: Secondary | ICD-10-CM | POA: Diagnosis not present

## 2015-11-28 DIAGNOSIS — E271 Primary adrenocortical insufficiency: Secondary | ICD-10-CM

## 2015-11-28 DIAGNOSIS — R059 Cough, unspecified: Secondary | ICD-10-CM

## 2015-11-28 DIAGNOSIS — R600 Localized edema: Secondary | ICD-10-CM

## 2015-11-28 LAB — CBC WITH DIFFERENTIAL/PLATELET
BASOS ABS: 0 {cells}/uL (ref 0–200)
Basophils Relative: 0 %
EOS ABS: 118 {cells}/uL (ref 15–500)
EOS PCT: 2 %
HCT: 36.8 % (ref 35.0–45.0)
Hemoglobin: 11.8 g/dL (ref 11.7–15.5)
LYMPHS PCT: 33 %
Lymphs Abs: 1947 cells/uL (ref 850–3900)
MCH: 29.6 pg (ref 27.0–33.0)
MCHC: 32.1 g/dL (ref 32.0–36.0)
MCV: 92.2 fL (ref 80.0–100.0)
MONOS PCT: 8 %
MPV: 10.1 fL (ref 7.5–12.5)
Monocytes Absolute: 472 cells/uL (ref 200–950)
NEUTROS PCT: 57 %
Neutro Abs: 3363 cells/uL (ref 1500–7800)
PLATELETS: 172 10*3/uL (ref 140–400)
RBC: 3.99 MIL/uL (ref 3.80–5.10)
RDW: 14.3 % (ref 11.0–15.0)
WBC: 5.9 10*3/uL (ref 3.8–10.8)

## 2015-11-28 LAB — COMPLETE METABOLIC PANEL WITH GFR
ALT: 16 U/L (ref 6–29)
AST: 26 U/L (ref 10–35)
Albumin: 3.9 g/dL (ref 3.6–5.1)
Alkaline Phosphatase: 58 U/L (ref 33–130)
BILIRUBIN TOTAL: 0.3 mg/dL (ref 0.2–1.2)
BUN: 21 mg/dL (ref 7–25)
CHLORIDE: 103 mmol/L (ref 98–110)
CO2: 23 mmol/L (ref 20–31)
Calcium: 9.4 mg/dL (ref 8.6–10.4)
Creat: 1.24 mg/dL — ABNORMAL HIGH (ref 0.50–0.99)
GFR, EST AFRICAN AMERICAN: 54 mL/min — AB (ref >=60)
GFR, EST NON AFRICAN AMERICAN: 47 mL/min — AB (ref >=60)
Glucose, Bld: 78 mg/dL (ref 65–99)
POTASSIUM: 4.2 mmol/L (ref 3.5–5.3)
Sodium: 139 mmol/L (ref 135–146)
TOTAL PROTEIN: 6.7 g/dL (ref 6.1–8.1)

## 2015-11-28 MED ORDER — FUROSEMIDE 20 MG PO TABS: 20.0000 mg | ORAL_TABLET | Freq: Every day | ORAL | Status: DC

## 2015-11-28 MED ORDER — ALBUTEROL SULFATE (2.5 MG/3ML) 0.083% IN NEBU
2.5000 mg | INHALATION_SOLUTION | Freq: Once | RESPIRATORY_TRACT | Status: AC
  Administered 2015-11-28: 2.5 mg via RESPIRATORY_TRACT

## 2015-11-28 MED ORDER — DOLUTEGRAVIR SODIUM 50 MG PO TABS: 50.0000 mg | ORAL_TABLET | Freq: Every day | ORAL | Status: DC

## 2015-11-28 MED ORDER — EMTRICITABINE-TENOFOVIR AF 200-25 MG PO TABS: 1.0000 | ORAL_TABLET | Freq: Every day | ORAL | Status: DC

## 2015-11-28 MED FILL — TIVICAY 50 MG TABLET: 50 | 30 days supply | Qty: 30 | Fill #0 | Status: TO

## 2015-11-28 MED FILL — FUROSEMIDE 20 MG TABLET: 20 | 5 days supply | Qty: 5 | Fill #0

## 2015-11-28 MED FILL — DESCOVY 200-25 MG TABS: 200-25 | 30 days supply | Qty: 30 | Fill #0 | Status: TO

## 2015-11-28 NOTE — Progress Notes (Signed)
Patient ID: Shannon [name], female   DOB: 12/02/1955, 60 y.o.   MRN: [id number]      Rfv: follow up on hiv disease, and shortness of breath  Patient ID: Shannon [name], female   DOB: 1956-02-11, 59 y.o.   MRN: [id number]  HPI  60yo F with HIV disease, CD 4 count of 490/VL<20, currently on RLG, DRVr only. She has been well controlled despite missing nnrti in the regimen. She states that she has felt poorly since the winter having pneumonia, but of late she has noticed having worsening shortness of breath, weight gain specifically abdominal girth, some lower extremity swelling. She has seen pulmonology recently as well as one of my partners. Based upon her symptoms and chest CT findings, she was treated with additional course of amox/clav in early march but she has been taking the medication not as prescribed. Takes 1/2 tab of amox/clav 875 daily.   Outpatient Encounter Prescriptions as of 11/28/2015  Medication Sig  . albuterol (PROAIR HFA) 108 (90 Base) MCG/ACT inhaler Inhale 2 puffs into the lungs every 6 (six) hours as needed for wheezing or shortness of breath.  . ALPRAZolam (XANAX) 0.5 MG tablet Take 1 tablet (0.5 mg total) by mouth at bedtime.  [name] amoxicillin-clavulanate (AUGMENTIN) 875-125 MG tablet Take 1 tablet by mouth 2 (two) times daily.  . Biotin 1000 MCG tablet Take 500 mcg by mouth 1 day or 1 dose. Takes every 2-3 days  . budesonide-formoterol (SYMBICORT) 160-4.5 MCG/ACT inhaler Inhale 2 puffs into the lungs 2 (two) times daily.  . Coenzyme Q10 (COQ10 PO) Take 1 tablet by mouth daily.  . cyanocobalamin (,VITAMIN B-12,) 1000 MCG/ML injection Inject 1 mL (1,000 mcg total) into the muscle every 30 (thirty) days.  . Darunavir Ethanolate (PREZISTA) 800 MG tablet Take 1 tablet (800 mg total) by mouth daily with breakfast.  . ENSURE (ENSURE) Take 237 mLs by mouth 2 (two) times daily between meals.  [name] ipratropium-albuterol (DUONEB) 0.5-2.5 (3) MG/3ML SOLN Inhale 3 mLs into the lungs every 6 (six)  hours as needed.  [name] L-Lysine 500 MG CAPS Take 1 capsule by mouth once a week.  . losartan (COZAAR) 100 MG tablet Take 1 tablet (100 mg total) by mouth daily.  . Multiple Vitamin (MULTIVITAMIN WITH MINERALS) TABS tablet Take 1 tablet by mouth daily.  . Multiple Vitamins-Minerals (ZINC) LOZG Take 1 lozenge by mouth daily.  [name] NIFEdipine (PROCARDIA) 10 MG capsule Take 1 capsule (10 mg total) by mouth daily.  [name] omeprazole (PRILOSEC) 40 MG capsule TAKE ONE CAPSULE BY MOUTH EVERY DAY  . POTASSIUM PO Take 1 tablet by mouth daily.  . raltegravir (ISENTRESS) 400 MG tablet Take 1 tablet (400 mg total) by mouth 2 (two) times daily.  . ritonavir (NORVIR) 100 MG TABS tablet Take 1 tablet (100 mg total) by mouth daily with breakfast.  . sulfamethoxazole-trimethoprim (BACTRIM DS,SEPTRA DS) 800-160 MG tablet TAKE 1 TABLET BY MOUTH TWICE DAILY FOR 5 DAYS  . SYRINGE/NEEDLE, DISP, 1 ML (B-D SYRINGE/NEEDLE 1CC/25GX5/8) 25G X 5/8" 1 ML MISC 1 Units by Does not apply route every 30 (thirty) days.  . vitamin C (ASCORBIC ACID) 500 MG tablet Take 2 tablets (1,000 mg total) by mouth daily.  [name] ZETIA 10 MG tablet Take 1 tablet by mouth  daily   No facility-administered encounter medications on file as of 11/28/2015.     Patient Active Problem List   Diagnosis Date Noted  . OSA (obstructive sleep apnea) 10/10/2015  . Obesity 10/10/2015  .  Vitamin B12 deficiency 10/02/2015  . Inappropriate diet and eating habits 08/15/2015  . CAP (community acquired pneumonia) 07/29/2015  . UTI (urinary tract infection) 07/29/2015  . Opacity noted on imaging study   . Dyspnea 07/09/2015  . History of pulmonary embolism 07/09/2015  . Asthma, moderate persistent 07/09/2015  . HIV disease ([place]) 07/09/2015  . Essential hypertension 07/09/2015  . Mild diastolic dysfunction 97/09/6376     Health Maintenance Due  Topic Date Due  . TETANUS/TDAP  03/03/1973  . PAP SMEAR  03/04/1975  . MAMMOGRAM  03/03/2004  . COLONOSCOPY  03/03/2004    . ZOSTAVAX  03/03/2014     Review of Systems +shortness of breath, weight gain, fatigue, otherwise 10 point ros is negative Physical Exam   BP 173/107 mmHg  Pulse 89  Temp(Src) 98.2 F (36.8 C) (Oral)  Ht 5' 3.5" (1.613 m)  Wt 228 lb 8 oz (103.647 kg)  BMI 39.84 kg/m2 Physical Exam  Constitutional:  oriented to person, place, and time. appears well-developed and well-nourished. No distress.  HENT: [place]/AT, PERRLA, no scleral icterus Mouth/Throat: Oropharynx is clear and moist. No oropharyngeal exudate.  Cardiovascular: Normal rate, regular rhythm and normal heart sounds. Exam reveals no gallop and no friction rub.  No murmur heard.  Pulmonary/Chest: increase work of breathing while speaking. She has little execursion of air but breath sounds normal. No respiratory distress.  has no wheezes.  Neck = supple, no nuchal rigidity, post neck fat pad Abdominal: Soft. Bowel sounds are normal.  exhibits no distension. There is no tenderness.  Lymphadenopathy: no cervical adenopathy. No axillary adenopathy Neurological: alert and oriented to person, place, and time.  Skin: Skin is warm and dry. No rash noted. No erythema.  Psychiatric: a normal mood and affect.  behavior is normal.   Lab Results  Component Value Date   CD4TCELL 32* 11/15/2015   Lab Results  Component Value Date   CD4TABS 490 11/15/2015   CD4TABS 620 08/08/2015   CD4TABS 140* 07/09/2015   Lab Results  Component Value Date   HIV1RNAQUANT <20 11/15/2015   No results found for: HEPBSAB No results found for: RPR  CBC Lab Results  Component Value Date   WBC 4.7 11/15/2015   RBC 4.12 11/15/2015   HGB 12.2 11/15/2015   HCT 37.5 11/15/2015   PLT 214 11/15/2015   MCV 91.0 11/15/2015   MCH 29.6 11/15/2015   MCHC 32.5 11/15/2015   RDW 14.0 11/15/2015   LYMPHSABS 1.8 08/08/2015   MONOABS 0.6 08/08/2015   EOSABS 0.1 08/08/2015   BASOSABS 0.0 08/08/2015   BMET Lab Results  Component Value Date   NA 144  11/15/2015   K 3.8 11/15/2015   CL 104 11/15/2015   CO2 31 11/15/2015   GLUCOSE 91 11/15/2015   BUN 15 11/15/2015   CREATININE 0.90 11/15/2015   CALCIUM 9.7 11/15/2015   GFRNONAA 53* 07/11/2015   GFRAA >60 07/11/2015     Assessment and Plan  hiv disease = i am concerned that her PI based regimen is interfering with symbicort and she is having  Secondary adrenal insufficiency. We will change her to descovy and tivicay. It doesn't appear that she has [id number] mutation, but will watch closely. Will get copies of her genotype from [place] [facility] . Will check cortisol  Asthma = she reports stopping her symbicort in the last week-10 days. Also having some improvement with albuterol neb that we provided in clinic. Have her back on pulmonary management with symbicort nad albuterol to  see if it helps her symptoms. Shortness of breath could also be cardiac phenomenon. Will check BNP, physical exam shows trace pitting edema. Difficult to assess jvd. i have asked her to stop taking amox/clav.  Fluid overload = we will do short 5 d course of lasix. Check bmp next week to see if needs k+ supplementation  htn = will address at next visit

## 2015-11-29 LAB — BRAIN NATRIURETIC PEPTIDE: Brain Natriuretic Peptide: 54.9 pg/mL (ref <100)

## 2015-11-29 LAB — CORTISOL: Cortisol, Plasma: 4.7 ug/dL

## 2015-12-02 ENCOUNTER — Other Ambulatory Visit: Payer: Medicare Other

## 2015-12-02 DIAGNOSIS — E876 Hypokalemia: Secondary | ICD-10-CM | POA: Diagnosis not present

## 2015-12-02 LAB — BASIC METABOLIC PANEL
BUN: 18 mg/dL (ref 7–25)
CALCIUM: 10.2 mg/dL (ref 8.6–10.4)
CO2: 32 mmol/L — ABNORMAL HIGH (ref 20–31)
Chloride: 100 mmol/L (ref 98–110)
Creat: 1.05 mg/dL — ABNORMAL HIGH (ref 0.50–0.99)
Glucose, Bld: 122 mg/dL — ABNORMAL HIGH (ref 65–99)
POTASSIUM: 4.1 mmol/L (ref 3.5–5.3)
SODIUM: 141 mmol/L (ref 135–146)

## 2015-12-04 ENCOUNTER — Encounter: Payer: Self-pay | Admitting: Pulmonary Disease

## 2015-12-04 ENCOUNTER — Ambulatory Visit (INDEPENDENT_AMBULATORY_CARE_PROVIDER_SITE_OTHER): Payer: Medicare Other | Admitting: Pulmonary Disease

## 2015-12-04 ENCOUNTER — Telehealth: Payer: Self-pay | Admitting: Pulmonary Disease

## 2015-12-04 VITALS — BP 152/88 | HR 80 | Ht 63.5 in | Wt 226.0 lb

## 2015-12-04 DIAGNOSIS — J189 Pneumonia, unspecified organism: Secondary | ICD-10-CM

## 2015-12-04 DIAGNOSIS — Z724 Inappropriate diet and eating habits: Secondary | ICD-10-CM

## 2015-12-04 DIAGNOSIS — G4733 Obstructive sleep apnea (adult) (pediatric): Secondary | ICD-10-CM

## 2015-12-04 DIAGNOSIS — J4540 Moderate persistent asthma, uncomplicated: Secondary | ICD-10-CM | POA: Diagnosis not present

## 2015-12-04 DIAGNOSIS — R0609 Other forms of dyspnea: Secondary | ICD-10-CM

## 2015-12-04 DIAGNOSIS — B20 Human immunodeficiency virus [HIV] disease: Secondary | ICD-10-CM

## 2015-12-04 LAB — PULMONARY FUNCTION TEST
DL/VA % pred: 109 %
DL/VA: 5.28 ml/min/mmHg/L
DLCO COR % PRED: 67 %
DLCO cor: 16.29 ml/min/mmHg
DLCO unc % pred: 66 %
DLCO unc: 16.04 ml/min/mmHg
FEF 25-75 POST: 0.48 L/s
FEF 25-75 Pre: 0.26 L/sec
FEF2575-%Change-Post: 83 %
FEF2575-%Pred-Post: 23 %
FEF2575-%Pred-Pre: 12 %
FEV1-%CHANGE-POST: 24 %
FEV1-%Pred-Post: 39 %
FEV1-%Pred-Pre: 31 %
FEV1-PRE: 0.64 L
FEV1-Post: 0.8 L
FEV1FVC-%Change-Post: 10 %
FEV1FVC-%PRED-PRE: 63 %
FEV6-%Change-Post: 13 %
FEV6-%PRED-POST: 57 %
FEV6-%Pred-Pre: 50 %
FEV6-POST: 1.45 L
FEV6-Pre: 1.28 L
FEV6FVC-%CHANGE-POST: 0 %
FEV6FVC-%PRED-POST: 102 %
FEV6FVC-%Pred-Pre: 102 %
FVC-%Change-Post: 12 %
FVC-%PRED-POST: 55 %
FVC-%PRED-PRE: 49 %
FVC-POST: 1.45 L
PRE FEV1/FVC RATIO: 50 %
Post FEV1/FVC ratio: 55 %
Post FEV6/FVC ratio: 100 %
Pre FEV6/FVC Ratio: 99 %

## 2015-12-04 MED ORDER — TIOTROPIUM BROMIDE MONOHYDRATE 2.5 MCG/ACT IN AERS: 1.0000 | INHALATION_SPRAY | Freq: Every day | RESPIRATORY_TRACT | Status: DC

## 2015-12-04 NOTE — Telephone Encounter (Signed)
Patient notified of Dr. Corrie Dandy recommendations. Referral entered for Endo. Nothing further needed.

## 2015-12-04 NOTE — Telephone Encounter (Signed)
Pt returning call.Shannon [name] ° °

## 2015-12-04 NOTE — Assessment & Plan Note (Signed)
Patient is a nonsmoker. Dxed with asthma when she was in her 16s. Triggers: extreme change in weather. Her asthma has beed unstable . She moved from [place] to [place] in Oct 2016.  PFT (12/04/15)  FEV1/FVC 50%,  FEV1  0.64  31%. Some BD response. DLCO 66%. Pt stopped symbicort since she felt her heart was racing. She states she is better with alb prn. She also does not like prednisone.   Plan : 1. I extensively discussed with her the need for inhaled  steroids given the fact that she has been more winded recently. She does not want to be on steroids as much as possible. As a compromise, we will start her on Spiriva Respimat, 2 puffs daily. Continue albuterol as needed. She does not want to start a lot of new medicines because of interaction with her HIV meds. Ideally, she should be on both Symbicort and Spiriva for her severe obstruction related to chronic asthma. I also mentioned, she may need prednisone course taper. She wanted to hold off on that. She understands she can get worse with her breathing and because of what she decided on. 2. Will need abg on f/u.

## 2015-12-04 NOTE — Progress Notes (Signed)
Subjective:    Patient ID: Shannon [name], female    DOB: Mar 07, 1956, 60 y.o.   MRN: [id number]  HPI  ROV (12/04/15) Pt returns to office after being seen in 09/2015 for hypersomnia, asthma. Has been sick with "PNA" since last seen. Stopped symbicort 2/2 heart racing.  Still with cough and congestion. She feels better with proair > uses it 3x/week. Also has anxiety attacks. Has not been admitted since last seen. HIV meds have been changed. Took bactrim and augmentin for cough/congestion.   Review of Systems  Constitutional: Negative.   HENT: Positive for congestion and postnasal drip.   Eyes: Negative.   Respiratory: Positive for cough and shortness of breath.   Cardiovascular: Negative.   Gastrointestinal: Negative.   Endocrine: Negative.   Genitourinary: Negative.   Musculoskeletal: Negative.   Allergic/Immunologic: Negative.   Neurological: Negative.   Hematological: Negative.   Psychiatric/Behavioral: Negative.   All other systems reviewed and are negative.      Objective:   Physical Exam Vitals:  Filed Vitals:   12/04/15 1023  BP: 152/88  Pulse: 80  Height: 5' 3.5" (1.613 m)  Weight: 226 lb (102.513 kg)  SpO2: 93%    Constitutional/General:  Pleasant, well-nourished, well-developed, not in any distress,  Comfortably seating.  Well kempt  Body mass index is 39.4 kg/(m^2). Wt Readings from Last 3 Encounters:  12/04/15 226 lb (102.513 kg)  11/28/15 228 lb 8 oz (103.647 kg)  10/10/15 224 lb 12.8 oz (101.969 kg)    Neck circumference:   HEENT: Pupils equal and reactive to light and accommodation. Anicteric sclerae. Normal nasal mucosa.   No oral  lesions,  mouth clear,  oropharynx clear, no postnasal drip. (-) Oral thrush. No dental caries.  Airway - Mallampati class IV  Neck: No masses. Midline trachea. No JVD, (-) LAD. (-) bruits appreciated.  Respiratory/Chest: Grossly normal chest. (-) deformity. (-) Accessory muscle use.  Symmetric expansion. (-)  Tenderness on palpation.  Resonant on percussion.  Diminished BS on both lower lung zones. (-) wheezing, crackles, rhonchi (-) egophony  Cardiovascular: Regular rate and  rhythm, heart sounds normal, no murmur or gallops, no peripheral edema  Gastrointestinal:  Normal bowel sounds. Soft, non-tender. No hepatosplenomegaly.  (-) masses.   Musculoskeletal:  Normal muscle tone. Normal gait.   Extremities: Grossly normal. (-) clubbing, cyanosis.  (-) edema  Skin: (-) rash,lesions seen.   Neurological/Psychiatric : alert, oriented to time, place, person. Normal mood and affect             Assessment & Plan:  Asthma, moderate persistent Patient is a nonsmoker. Dxed with asthma when she was in her 55s. Triggers: extreme change in weather. Her asthma has beed unstable . She moved from [place] to [place] in Oct 2016.  PFT (12/04/15)  FEV1/FVC 50%,  FEV1  0.64  31%. Some BD response. DLCO 66%. Pt stopped symbicort since she felt her heart was racing. She states she is better with alb prn. She also does not like prednisone.   Plan : 1. I extensively discussed with her the need for inhaled  steroids given the fact that she has been more winded recently. She does not want to be on steroids as much as possible. As a compromise, we will start her on Spiriva Respimat, 2 puffs daily. Continue albuterol as needed. She does not want to start a lot of new medicines because of interaction with her HIV meds. Ideally, she should be on both Symbicort and Spiriva for her  severe obstruction related to chronic asthma. I also mentioned, she may need prednisone course taper. She wanted to hold off on that. She understands she can get worse with her breathing and because of what she decided on. 2. Will need abg on f/u.    CAP (community acquired pneumonia) Patient had pneumonia in October 2016 which seemed to have lingered for a couple months. She took Levaquin back in October. She had Bactrim again in January.  Chest x-ray in November 2016 and December 2016 showed right lower lobe infiltrate. Chest CT scan (10/2015) right middle lobe infiltrate. Looks like it's chronic. Low lung volumes. Recent  Bactrim and Augmentin. Plan : sputum sample. May need dx bronch if she doe not get better. Need to determine if she has OSA compliucating airway prior to bronch.   OSA (obstructive sleep apnea) Patient was diagnosed with obstructive sleep apnea more than 10 years ago. She has hypersomnia, snoring, gasping, choking, fatigue. She had a sleep study done in [place]. She never had a CPAP  Study done. Her hypersomnia has gotten worse. Hypersomnia affects her functionality. -- Plan for home sleep study again.  Patient was advised to call if no one has called her regarding the home sleep study in a week. -- Advised on good sleep hygiene. -- Advised not to engage in activities requiring vigilance if she is sleepy.     Exertional dyspnea Patient with recent dyspnea. Had pneumonia in October 2016. Chest x-ray with right lower lobe infiltrate, persistent. Needs echo. R/o chf, pulm htn.   HIV disease ([place]) Currently follows with ID. HIV meds changed recently 2/2 possible interaction with symbicort.    Return to clinic in 6 weeks  J. Shirl Harris, MD 12/04/2015, 1:06 PM [facility] Pager [phone] After 3 pm or if no answer, call [phone]

## 2015-12-04 NOTE — Telephone Encounter (Signed)
Left message for patient to call back  

## 2015-12-04 NOTE — Patient Instructions (Signed)
1. We will schedule you for a home sleep study. 2. Start Spiriva Respimat, 2 puffs daily. 3. Continue with Albuterol, 2 puffs every 4 hours as needed. 4. We will need a sample for sputum. 5. Call the office if with worsening symptoms.  Return to clinic in 6 weeks.

## 2015-12-04 NOTE — Assessment & Plan Note (Signed)
Patient had pneumonia in October 2016 which seemed to have lingered for a couple months. She took Levaquin back in October. She had Bactrim again in January. Chest x-ray in November 2016 and December 2016 showed right lower lobe infiltrate. Chest CT scan (10/2015) right middle lobe infiltrate. Looks like it's chronic. Low lung volumes. Recent  Bactrim and Augmentin. Plan : sputum sample. May need dx bronch if she doe not get better. Need to determine if she has OSA compliucating airway prior to bronch.

## 2015-12-04 NOTE — Addendum Note (Signed)
Addended by: Wynn Banker H on: 12/04/2015 11:10 AM   Modules accepted: Orders

## 2015-12-04 NOTE — Telephone Encounter (Signed)
pls refer pt to an endocrinologist with [facility]. Thanks!  AD

## 2015-12-04 NOTE — Telephone Encounter (Signed)
Patient would like to know the name of an Endocrinologist she can see.  Patient wants to see one through [facility].  Patient wants to know if Dr. Jeanann Lewandowsky is a good fit for her.  She said that she does not want a hospitalist, she wants an endocrinologist with a lot of experience. Dr. Corrie Dandy, please advise.

## 2015-12-04 NOTE — Assessment & Plan Note (Signed)
Patient was diagnosed with obstructive sleep apnea more than 10 years ago. She has hypersomnia, snoring, gasping, choking, fatigue. She had a sleep study done in [place]. She never had a CPAP  Study done. Her hypersomnia has gotten worse. Hypersomnia affects her functionality. -- Plan for home sleep study again.  Patient was advised to call if no one has called her regarding the home sleep study in a week. -- Advised on good sleep hygiene. -- Advised not to engage in activities requiring vigilance if she is sleepy.

## 2015-12-04 NOTE — Progress Notes (Signed)
PFT done today. 12/04/2015

## 2015-12-04 NOTE — Assessment & Plan Note (Signed)
Patient with recent dyspnea. Had pneumonia in October 2016. Chest x-ray with right lower lobe infiltrate, persistent. Needs echo. R/o chf, pulm htn.

## 2015-12-04 NOTE — Assessment & Plan Note (Signed)
Currently follows with ID. HIV meds changed recently 2/2 possible interaction with symbicort.

## 2015-12-09 ENCOUNTER — Telehealth: Payer: Self-pay | Admitting: Pulmonary Disease

## 2015-12-09 DIAGNOSIS — E271 Primary adrenocortical insufficiency: Secondary | ICD-10-CM

## 2015-12-09 NOTE — Telephone Encounter (Signed)
Called spoke with patient who reported that she and AD had discussed an endocrinology referral at her last appt.  No mention of this in the ov note.  Pt is aware that AD is not available until 4.19.17 and is okay with this.    Dr Corrie Dandy please advise, thank you.  Per 4.12.17 visit with AD: Patient Instructions       1. We will schedule you for a home sleep study. 2. Start Spiriva Respimat, 2 puffs daily. 3. Continue with Albuterol, 2 puffs every 4 hours as needed. 4. We will need a sample for sputum. 5. Call the office if with worsening symptoms.  Return to clinic in 6 weeks

## 2015-12-11 DIAGNOSIS — E271 Primary adrenocortical insufficiency: Secondary | ICD-10-CM | POA: Insufficient documentation

## 2015-12-11 NOTE — Telephone Encounter (Signed)
Pt mentioned she wanted to see endocrine MD during last visit. I agreed with her plan.  Pls request her to be seen by endocrine.  Thanks!  AD

## 2015-12-11 NOTE — Telephone Encounter (Signed)
Spoke with the pt and notified ok to refer  She is needing to be seen by Endo for Addison's dz  I have placed referral order  Nothing further needed

## 2015-12-13 ENCOUNTER — Encounter: Payer: Self-pay | Admitting: Internal Medicine

## 2015-12-13 ENCOUNTER — Ambulatory Visit (INDEPENDENT_AMBULATORY_CARE_PROVIDER_SITE_OTHER): Payer: Medicare Other | Admitting: Internal Medicine

## 2015-12-13 VITALS — BP 146/86 | HR 113 | Temp 98.2°F | Ht 63.5 in | Wt 226.1 lb

## 2015-12-13 DIAGNOSIS — J984 Other disorders of lung: Secondary | ICD-10-CM | POA: Diagnosis not present

## 2015-12-13 DIAGNOSIS — R5382 Chronic fatigue, unspecified: Secondary | ICD-10-CM

## 2015-12-13 DIAGNOSIS — R0609 Other forms of dyspnea: Secondary | ICD-10-CM

## 2015-12-13 DIAGNOSIS — R0600 Dyspnea, unspecified: Secondary | ICD-10-CM

## 2015-12-13 DIAGNOSIS — I10 Essential (primary) hypertension: Secondary | ICD-10-CM | POA: Diagnosis not present

## 2015-12-13 DIAGNOSIS — G479 Sleep disorder, unspecified: Secondary | ICD-10-CM

## 2015-12-13 DIAGNOSIS — E271 Primary adrenocortical insufficiency: Secondary | ICD-10-CM

## 2015-12-13 DIAGNOSIS — J189 Pneumonia, unspecified organism: Secondary | ICD-10-CM

## 2015-12-13 DIAGNOSIS — J4540 Moderate persistent asthma, uncomplicated: Secondary | ICD-10-CM

## 2015-12-13 MED ORDER — AZITHROMYCIN 250 MG PO TABS: ORAL_TABLET | ORAL | Status: AC

## 2015-12-13 NOTE — Progress Notes (Signed)
Medicine attending: Medical history, presenting problems, physical findings, and medications, reviewed with resident physician Dr William Kennedy on the day of the patient visit and I concur with his evaluation and management plan. 

## 2015-12-13 NOTE — Assessment & Plan Note (Signed)
Patient says she is still not better, however was seen 1 week ago by pulmonology, with recommendations for sputum sample and possible diagnostic bronchoscopy. She is endorsing some mild sputum production, without any other new symptoms. She requests an antibiotic at this time. I explained to her that she has been on multiple courses of antibiotics in the past, with only interval mild improvement. However she feels she actually felt much better on those courses of antibiotics. She had Levaquin in October, but subsequent courses of antibiotics provided only minimal atypical coverage. She may have a persistent lingering walking pneumonia. -Azithromycin x 5 days -Follow-up with pulmonology (has appt in 5 weeks)

## 2015-12-13 NOTE — Assessment & Plan Note (Signed)
Patient does have chronic fatigue, sleep disturbances, and is convinced that she has Addison's disease. There is a possibility of a secondary adrenal insufficiency given the interactions between her HIV medications and Symbicort. However, given the fact that she is in fact hypertensive, is morbidly obese, and has no appreciable electrolyte abnormalities that would suggest Addison's disease, I am skeptical of this diagnosis. In addition, I explained to her that if she in fact does have Addison's disease, she would need to take daily steroids,, which she has previously been very resistant to taking. Regardless, she has a appointment with endocrinology coming up very soon. -Follow-up endocrinology recommendations; appreciate the thoughtful care of this mutual patient

## 2015-12-13 NOTE — Progress Notes (Signed)
   Patient ID: Shannon [name] female   DOB: [birth year] 59 y.o.   MRN: [id number]  Subjective:   HPI: [name] is a 60 y.o. with PMH of HIV, HTN, moderate persistent asthma, and presumed persistent pneumonia who presents to [facility] today for follow-up of her HTN.   Please see problem-based charting for status of medical issues pertinent to this visit.  Review of Systems: Pertinent items noted in HPI and remainder of comprehensive ROS otherwise negative.  Objective:  Physical Exam: Filed Vitals:   12/13/15 0943  BP: 146/86  Pulse: 113  Temp: 98.2 F (36.8 C)  TempSrc: Oral  Height: 5' 3.5" (1.613 m)  Weight: 226 lb 1.6 oz (102.558 kg)  SpO2: 98%   Gen: Well-appearing, alert and oriented to person, place, and time HEENT: Oropharynx clear without erythema or exudate.  Neck: No cervical LAD, no thyromegaly or nodules, no JVD noted. CV: Normal rate, regular rhythm, no murmurs, rubs, or gallops Pulmonary: Normal effort, CTA bilaterally, no wheezing, rales, or rhonchi Abdominal: Soft, non-tender, non-distended obese abdomen, without rebound, guarding, or masses Extremities: Distal pulses 2+ in upper and lower extremities bilaterally, no tenderness, erythema or edema Skin: No atypical appearing moles. No rashes  Assessment & Plan:  Please see problem-based charting for assessment and plan.  Blane Ohara, MD Resident Physician, PGY-1 Department of Internal Medicine [facility]

## 2015-12-13 NOTE — Assessment & Plan Note (Signed)
BP Readings from Last 3 Encounters:  12/13/15 146/86  12/04/15 152/88  11/28/15 189/126    Lab Results  Component Value Date   NA 141 12/02/2015   K 4.1 12/02/2015   CREATININE 1.05* 12/02/2015    Assessment: Blood pressure control:  well-controlled Progress toward BP goal:   near goal Comments: on nifedipine, losartan  Plan: Medications:  continue current medications

## 2015-12-13 NOTE — Assessment & Plan Note (Signed)
Patient has previously been seen by cardiology when she was living in [place] and [place], for undisclosed heart disease. She is unable to recall specific events or details, but does recall that she did have a catheterization a few years ago in [place]. While she does have a working diagnosis of moderate persistent asthma, she also endorses progressive dyspnea on exertion as well as lower extremity swelling. She denies angina at this time. She is also concerned because her mother died of an enlarged heart. While her obstructive lung disease could be solely explaining her symptoms, it is important to evaluate for CHF and pulmonary hypertension as possibilities for her symptoms. -Placed order for TTE today -Cardiology referral placed as well

## 2015-12-26 ENCOUNTER — Ambulatory Visit (INDEPENDENT_AMBULATORY_CARE_PROVIDER_SITE_OTHER): Payer: Medicare Other | Admitting: Internal Medicine

## 2015-12-26 ENCOUNTER — Encounter: Payer: Self-pay | Admitting: Internal Medicine

## 2015-12-26 VITALS — BP 150/89 | HR 103 | Temp 98.3°F | Ht 64.0 in | Wt 228.0 lb

## 2015-12-26 DIAGNOSIS — B20 Human immunodeficiency virus [HIV] disease: Secondary | ICD-10-CM

## 2015-12-26 LAB — BASIC METABOLIC PANEL
BUN: 17 mg/dL (ref 7–25)
CHLORIDE: 105 mmol/L (ref 98–110)
CO2: 29 mmol/L (ref 20–31)
CREATININE: 1.16 mg/dL — AB (ref 0.50–0.99)
Calcium: 9.6 mg/dL (ref 8.6–10.4)
Glucose, Bld: 90 mg/dL (ref 65–99)
POTASSIUM: 4 mmol/L (ref 3.5–5.3)
SODIUM: 141 mmol/L (ref 135–146)

## 2015-12-26 NOTE — Progress Notes (Signed)
Patient ID: Shannon [name], female   DOB: 1955-09-10, 60 y.o.   MRN: [id number]       Patient ID: Shannon [name], female   DOB: 1955/10/02, 60 y.o.   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 At her last appointment, there was concern that she was taking inhaled steroids with her protease inhibitor based regimen, which is high risk for drug interaction, principally causing adrenal insufficiency. She had complained of lower extremity swelling, DOE, dizziness at that time. Her work up did not suggest AI ( due to hypertension, normal cortisol). She was changed to tivicay-descovy, which can still give inhaled steroids wihtout side effects. She is tolerating her new regimen without difficulty. Since we last saw her, she has followed up with internal medicine, pulmonology and has upcoming appt with endocrinology. She has recently finished azithromycin course < 14 days for possible upper respiratory tract infection exacerbating her asthma.  She complains that she as increase work of breathing due to increasing abdominal girth. Most noticeable when she lays flat.  Outpatient Encounter Prescriptions as of 12/26/2015  Medication Sig  . albuterol (PROAIR HFA) 108 (90 Base) MCG/ACT inhaler Inhale 2 puffs into the lungs every 6 (six) hours as needed for wheezing or shortness of breath.  . Biotin 1000 MCG tablet Take 500 mcg by mouth 1 day or 1 dose. Takes every 2-3 days  . Coenzyme Q10 (COQ10 PO) Take 1 tablet by mouth daily.  . cyanocobalamin (,VITAMIN B-12,) 1000 MCG/ML injection Inject 1 mL (1,000 mcg total) into the muscle every 30 (thirty) days.  [name] dolutegravir (TIVICAY) 50 MG tablet Take 1 tablet (50 mg total) by mouth daily.  [name] emtricitabine-tenofovir AF (DESCOVY) 200-25 MG tablet Take 1 tablet by mouth daily.  [name] ENSURE (ENSURE) Take 237 mLs by mouth 2 (two) times daily between meals.    [name] ipratropium-albuterol (DUONEB) 0.5-2.5 (3) MG/3ML SOLN Inhale 3 mLs into the lungs every 6 (six) hours as needed.  [name] L-Lysine 500 MG CAPS Take 1 capsule by mouth once a week.  . losartan (COZAAR) 100 MG tablet Take 1 tablet (100 mg total) by mouth daily.  . Multiple Vitamin (MULTIVITAMIN WITH MINERALS) TABS tablet Take 1 tablet by mouth daily.  . Multiple Vitamins-Minerals (ZINC) LOZG Take 1 lozenge by mouth daily.  [name] NIFEdipine (PROCARDIA) 10 MG capsule Take 1 capsule (10 mg total) by mouth daily.  [name] omeprazole (PRILOSEC) 40 MG capsule TAKE ONE CAPSULE BY MOUTH EVERY DAY  . POTASSIUM PO Take 1 tablet by mouth daily.  . SYRINGE/NEEDLE, DISP, 1 ML (B-D SYRINGE/NEEDLE 1CC/25GX5/8) 25G X 5/8" 1 ML MISC 1 Units by Does not apply route every 30 (thirty) days.  . vitamin C (ASCORBIC ACID) 500 MG tablet Take 2 tablets (1,000 mg total) by mouth daily.  [name] ZETIA 10 MG tablet Take 1 tablet by mouth  daily  . ALPRAZolam (XANAX) 0.5 MG tablet Take 1 tablet (0.5 mg total) by mouth at bedtime. (Patient not taking: Reported on 12/26/2015)  . Tiotropium Bromide Monohydrate (SPIRIVA RESPIMAT) 2.5 MCG/ACT AERS Inhale 1 puff into the lungs daily. (Patient not taking: Reported on 12/26/2015)  . [DISCONTINUED] furosemide (LASIX) 20 MG tablet Take 1 tablet (20 mg total) by mouth daily. (Patient not taking: Reported on 12/04/2015)   No facility-administered encounter medications on file as of 12/26/2015.     Patient Active Problem List   Diagnosis  Date Noted  . Addison's disease ([place]) 12/11/2015  . Exertional dyspnea 12/04/2015  . OSA (obstructive sleep apnea) 10/10/2015  . Obesity 10/10/2015  . Vitamin B12 deficiency 10/02/2015  . Inappropriate diet and eating habits 08/15/2015  . CAP (community acquired pneumonia) 07/29/2015  . Opacity noted on imaging study   . Dyspnea 07/09/2015  . History of pulmonary embolism 07/09/2015  . Asthma, moderate persistent 07/09/2015  . HIV disease ([place]) 07/09/2015  . Essential  hypertension 07/09/2015  . Mild diastolic dysfunction 91/47/8295     Health Maintenance Due  Topic Date Due  . TETANUS/TDAP  03/03/1973  . PAP SMEAR  03/04/1975  . MAMMOGRAM  03/03/2004  . COLONOSCOPY  03/03/2004  . ZOSTAVAX  03/03/2014     Review of Systems Per hpi, otherwise 10 point ros is negative Physical Exam   BP 150/89 mmHg  Pulse 103  Temp(Src) 98.3 F (36.8 C) (Oral)  Ht '5\' 4"'$  (1.626 m)  Wt 228 lb (103.42 kg)  BMI 39.12 kg/m2 Physical Exam  Constitutional:  oriented to person, place, and time. appears well-developed and well-nourished. No distress.  HENT: [place]/AT, PERRLA, no scleral icterus Mouth/Throat: Oropharynx is clear and moist. No oropharyngeal exudate.  Cardiovascular: Normal rate, regular rhythm and normal heart sounds. Exam reveals no gallop and no friction rub.  No murmur heard.  Pulmonary/Chest: Effort normal and breath sounds normal. No respiratory distress.  has no wheezes.  Neck = supple, no nuchal rigidity Abdominal: Soft. Bowel sounds are normal.  exhibits no distension. There is no tenderness.  Lymphadenopathy: no cervical adenopathy. No axillary adenopathy Neurological: alert and oriented to person, place, and time.  Skin: Skin is warm and dry. No rash noted. No erythema.  Psychiatric: a normal mood and affect.  behavior is normal.    Lab Results  Component Value Date   CD4TCELL 32* 11/15/2015   Lab Results  Component Value Date   CD4TABS 490 11/15/2015   CD4TABS 620 08/08/2015   CD4TABS 140* 07/09/2015   Lab Results  Component Value Date   HIV1RNAQUANT <20 11/15/2015   No results found for: HEPBSAB No results found for: RPR  CBC Lab Results  Component Value Date   WBC 5.9 11/28/2015   RBC 3.99 11/28/2015   HGB 11.8 11/28/2015   HCT 36.8 11/28/2015   PLT 172 11/28/2015   MCV 92.2 11/28/2015   MCH 29.6 11/28/2015   MCHC 32.1 11/28/2015   RDW 14.3 11/28/2015   LYMPHSABS 1947 11/28/2015   MONOABS 472 11/28/2015   EOSABS  118 11/28/2015   BASOSABS 0 11/28/2015   BMET Lab Results  Component Value Date   NA 141 12/02/2015   K 4.1 12/02/2015   CL 100 12/02/2015   CO2 32* 12/02/2015   GLUCOSE 122* 12/02/2015   BUN 18 12/02/2015   CREATININE 1.05* 12/02/2015   CALCIUM 10.2 12/02/2015   GFRNONAA 47* 11/28/2015   GFRAA 54* 11/28/2015     Assessment and Plan   hiv disease = continue with tivicay and descovy. At the last visit, we discontinued her protease inhibitor based regimen due to concern for drug interaction with inhaled steroids, causing secondary adrenal insufficiency. Work up did not suggests adrenal insufficiency due to hypertension and normal cortisol  She is tolerating well her new regimen  Nasal congestion with occ ashtma = Still has shortness of breath though oxygen saturation is good, finished z pack at end of April. Recommend to use zyrtec to help with seasonal allergies. netty pot if still feeling congested.  I recommended to her that another course of abtx is unlikely to help since I think this is related to seasonal allergies  Diastolic/systolic hypertension = Sees cardiology next week for tte, and cardiac management  Weight gain, suspect it is fluid overload = would like to start her on lasix daily. Will check her BMP. Also see what cardiology prescribes in the coming week, to see if diuretic is needed

## 2015-12-27 ENCOUNTER — Ambulatory Visit (INDEPENDENT_AMBULATORY_CARE_PROVIDER_SITE_OTHER): Payer: Medicare Other | Admitting: Endocrinology

## 2015-12-27 ENCOUNTER — Other Ambulatory Visit: Payer: Self-pay | Admitting: Internal Medicine

## 2015-12-27 ENCOUNTER — Encounter: Payer: Self-pay | Admitting: Endocrinology

## 2015-12-27 ENCOUNTER — Telehealth: Payer: Self-pay | Admitting: *Deleted

## 2015-12-27 VITALS — BP 142/98 | HR 82 | Temp 98.2°F | Resp 16 | Ht 64.0 in | Wt 227.0 lb

## 2015-12-27 DIAGNOSIS — E2749 Other adrenocortical insufficiency: Secondary | ICD-10-CM

## 2015-12-27 LAB — CORTISOL
CORTISOL PLASMA: 22.7 ug/dL
Cortisol, Plasma: 8.7 ug/dL

## 2015-12-27 MED ORDER — COSYNTROPIN 0.25 MG IJ SOLR
0.2500 mg | Freq: Once | INTRAMUSCULAR | Status: AC
  Administered 2015-12-27: 0.25 mg via INTRAMUSCULAR

## 2015-12-27 NOTE — Progress Notes (Signed)
Subjective:    Patient ID: Shannon [name], female    DOB: 24-Sep-1955, 60 y.o.   MRN: [id number]  HPI She finished prednisone in late 2016.  no h/o abdominal or brain injury.  She had resection of a superficial benign mass from the right frontal area in 1974.  No h/o cancer, thyroid problems, seizures, hypoglycemia, amyloidosis, tuberculosis, or diabetes.  No h/o ketoconazole, rifampin, or dilantin.  She reports moderate weight gain, worse at the abdomen and upper back.  She has assoc fatigue.   Past Medical History  Diagnosis Date  . HIV antibody positive ([place])   . Hypertension   . Hyperlipidemia   . Heart murmur   . Chronic asthma with acute exacerbation     "I have chronic asthma all the time; sometimes exacerbations" (07/09/2015)  . Pneumonia 07/09/2015  . Sleep apnea     "never completed part 2 of study; never wore mask" (07/09/2015)  . Anemia   . History of blood transfusion     "related to my brain surgery I think"  . HIV disease ([place])   . GERD (gastroesophageal reflux disease)   . Arthritis     "starting to; in my hands" (07/09/2015)  . Chronic lower back pain   . Anxiety   . Cyst of right kidney     "3 of them; dx'd in ~ 01/2015"    Past Surgical History  Procedure Laterality Date  . Cardiac catheterization    . Tonsillectomy and adenoidectomy    . Abdominal hysterectomy      "robotic laparosopic"  . Brain surgery  1974    "brain tumor; benign; on top of my brain; got a plate in there"    Social History   Social History  . Marital Status: Divorced    Spouse Name: N/A  . Number of Children: N/A  . Years of Education: N/A   Occupational History  . Not on file.   Social History Main Topics  . Smoking status: Never Smoker   . Smokeless tobacco: Never Used  . Alcohol Use: 0.0 oz/week    0 Standard drinks or equivalent per week     Comment: Rarely.  . Drug Use: No  . Sexual Activity:    Partners: Male   Other Topics Concern  . Not on file   Social  History Narrative    Current Outpatient Prescriptions on File Prior to Visit  Medication Sig Dispense Refill  . albuterol (PROAIR HFA) 108 (90 Base) MCG/ACT inhaler Inhale 2 puffs into the lungs every 6 (six) hours as needed for wheezing or shortness of breath. 6.7 g 3  . ALPRAZolam (XANAX) 0.5 MG tablet Take 1 tablet (0.5 mg total) by mouth at bedtime. 90 tablet 0  . Biotin 1000 MCG tablet Take 500 mcg by mouth 1 day or 1 dose. Takes every 2-3 days    . Coenzyme Q10 (COQ10 PO) Take 1 tablet by mouth daily.    . cyanocobalamin (,VITAMIN B-12,) 1000 MCG/ML injection Inject 1 mL (1,000 mcg total) into the muscle every 30 (thirty) days. 1 mL 3  . dolutegravir (TIVICAY) 50 MG tablet Take 1 tablet (50 mg total) by mouth daily. 30 tablet 11  . emtricitabine-tenofovir AF (DESCOVY) 200-25 MG tablet Take 1 tablet by mouth daily. 30 tablet 11  . ENSURE (ENSURE) Take 237 mLs by mouth 2 (two) times daily between meals. 237 mL 11  . ipratropium-albuterol (DUONEB) 0.5-2.5 (3) MG/3ML SOLN Inhale 3 mLs into the lungs every 6 (  six) hours as needed.    [name] L-Lysine 500 MG CAPS Take 1 capsule by mouth once a week.    . losartan (COZAAR) 100 MG tablet Take 1 tablet (100 mg total) by mouth daily. 90 tablet 0  . Multiple Vitamin (MULTIVITAMIN WITH MINERALS) TABS tablet Take 1 tablet by mouth daily. 90 tablet   . Multiple Vitamins-Minerals (ZINC) LOZG Take 1 lozenge by mouth daily. 100 lozenge 0  . NIFEdipine (PROCARDIA) 10 MG capsule Take 1 capsule (10 mg total) by mouth daily. 90 capsule 0  . omeprazole (PRILOSEC) 40 MG capsule TAKE ONE CAPSULE BY MOUTH EVERY DAY 90 capsule 0  . POTASSIUM PO Take 1 tablet by mouth daily.    . SYRINGE/NEEDLE, DISP, 1 ML (B-D SYRINGE/NEEDLE 1CC/25GX5/8) 25G X 5/8" 1 ML MISC 1 Units by Does not apply route every 30 (thirty) days. 50 each 0  . Tiotropium Bromide Monohydrate (SPIRIVA RESPIMAT) 2.5 MCG/ACT AERS Inhale 1 puff into the lungs daily. 1 Inhaler 0  . vitamin C (ASCORBIC ACID)  500 MG tablet Take 2 tablets (1,000 mg total) by mouth daily. 180 tablet 0  . ZETIA 10 MG tablet Take 1 tablet by mouth  daily 90 tablet 0   No current facility-administered medications on file prior to visit.    Allergies  Allergen Reactions  . Ciprofloxacin Hives    Family History  Problem Relation Age of Onset  . Asthma Mother   . Heart failure Mother     cardiomyopathy  . Heart murmur Sister     BP 142/98 mmHg  Pulse 82  Temp(Src) 98.2 F (36.8 C) (Oral)  Resp 16  Ht 5' 4"  (1.626 m)  Wt 227 lb (102.967 kg)  BMI 38.95 kg/m2  SpO2 93%   Review of Systems Denies fever, headache, blurry vision, n/v, seizure, anxiety, diarrhea, vitiligo, or change in skin tone. She reports lightheadedness, abd pain, muscle weakness, difficulty with concentration, cold intolerance, headache, easy bruising, palpitations, doe, and night sweats.       Objective:   Physical Exam VS: see vs page GEN: no distress.  central obesity HEAD: head: no deformity eyes: no periorbital swelling, no proptosis external nose and ears are normal mouth: no lesion seen NECK: supple, thyroid is not enlarged CHEST WALL: no deformity LUNGS: clear to auscultation CV: reg rate and rhythm, no murmur ABD: abdomen is soft, nontender.  no hepatosplenomegaly.  not distended.  no hernia MUSCULOSKELETAL: muscle bulk and strength are grossly normal.  no obvious joint swelling.  gait is normal and steady EXTEMITIES: no deformity.  no ulcer on the feet.  feet are of normal color and temp.  1+ bilat leg edema PULSES: dorsalis pedis intact bilat.  no carotid bruit NEURO:  cn 2-12 grossly intact.   readily moves all 4's.  sensation is intact to touch on the feet SKIN:  Normal texture and temperature.  No rash or suspicious lesion is visible.  No vitiligo. NODES:  None palpable at the neck PSYCH: alert, well-oriented.  Does not appear anxious nor depressed.  I have reviewed outside records, and summarized: Pt was noted  to have low random cortisol, and referred here.   acth stimulation test is done: baseline cortisol level=9 then cosyntropin 250 mcg is given IM 45 minutes later, cortisol level=23 (normal response)  Lab Results  Component Value Date   TSH 0.690 08/08/2015      Assessment & Plan:  Hypocortisolism.  Adrenal insufficiency is excluded Weight gain.  We discussed need to  re-lose   Patient is advised the following: Patient Instructions  blood tests are requested for you today.  We'll let you know about the results. I would be happy to see you back here as necessary.    addendum: normal result.  Ret PRN

## 2015-12-27 NOTE — Telephone Encounter (Signed)
i am not a fan of xanax. i only prescribe it to one patient that was dr. Windy Fast. Please see if her main pcp will do it.

## 2015-12-27 NOTE — Progress Notes (Signed)
Pre visit review using our clinic review tool, if applicable. No additional management support is needed unless otherwise documented below in the visit note. 

## 2015-12-27 NOTE — Patient Instructions (Signed)
blood tests are requested for you today.  We'll let you know about the results. I would be happy to see you back here as necessary.

## 2015-12-27 NOTE — Telephone Encounter (Signed)
Patient called to ask for a refill of her Xanax medication. She advises she only takes half a tablet and she thought her doctor was going to refill the Rx at her visit on Thursday 12/26/15. Advised her will send the doctor a message and ask if that is ok and give her a call back once she responds.

## 2015-12-30 LAB — HIV-1 RNA QUANT-NO REFLEX-BLD: HIV 1 RNA Quant: <20 copies/mL (ref <20)

## 2015-12-30 NOTE — Telephone Encounter (Signed)
Patient notified to call her PCP. She advised she was confused and thought she had she advised she will call them today.

## 2015-12-30 NOTE — Telephone Encounter (Signed)
Patient called requesting refill of Albuterol ProAir at [place]. 1 refill still remaining at [facility]. Refill called into [facility] on [address].

## 2015-12-31 ENCOUNTER — Encounter: Payer: Self-pay | Admitting: Cardiovascular Disease

## 2015-12-31 ENCOUNTER — Ambulatory Visit (INDEPENDENT_AMBULATORY_CARE_PROVIDER_SITE_OTHER): Payer: Medicare Other | Admitting: Cardiovascular Disease

## 2015-12-31 VITALS — BP 190/112 | HR 89 | Ht 64.0 in | Wt 227.8 lb

## 2015-12-31 DIAGNOSIS — E785 Hyperlipidemia, unspecified: Secondary | ICD-10-CM

## 2015-12-31 DIAGNOSIS — I493 Ventricular premature depolarization: Secondary | ICD-10-CM

## 2015-12-31 DIAGNOSIS — I351 Nonrheumatic aortic (valve) insufficiency: Secondary | ICD-10-CM | POA: Diagnosis not present

## 2015-12-31 DIAGNOSIS — I350 Nonrheumatic aortic (valve) stenosis: Secondary | ICD-10-CM

## 2015-12-31 DIAGNOSIS — R072 Precordial pain: Secondary | ICD-10-CM

## 2015-12-31 DIAGNOSIS — I10 Essential (primary) hypertension: Secondary | ICD-10-CM | POA: Diagnosis not present

## 2015-12-31 DIAGNOSIS — R0602 Shortness of breath: Secondary | ICD-10-CM

## 2015-12-31 NOTE — Patient Instructions (Addendum)
Medication Instructions:  Your physician recommends that you continue on your current medications as directed. Please refer to the Current Medication list given to you today.  Labwork: none  Testing/Procedures: Your physician has requested that you have an echocardiogram. Echocardiography is a painless test that uses sound waves to create images of your heart. It provides your doctor with information about the size and shape of your heart and how well your heart's chambers and valves are working. This procedure takes approximately one hour. There are no restrictions for this procedure.  Your physician has requested that you have a lexiscan myoview. For further information please visit [email or web address]. Please follow instruction sheet, as given. 2 DAY STUDY  Follow-Up: Your physician recommends that you schedule a follow-up appointment in: 3 month ov  Your physician recommends that you schedule a follow-up appointment in: [place] D in 2 weeks for blood pressure  Any Other Special Instructions Will Be Listed Below (If Applicable). START TAKING YOUR BLOOD PRESSURE AT HOME TWICE A DAY AND WRITE IT DOWN BRING WITH YOU TO YOUR VISIT WITH KRISTIN   If you need a refill on your cardiac medications before your next appointment, please call your pharmacy.

## 2015-12-31 NOTE — Progress Notes (Signed)
Cardiology Office Note   Date:  12/31/2015   ID:  Shannon [name], DOB Feb 10, 1956, MRN [id number]  PCP:  Shannon Groves, DO  Cardiologist:   Shannon Latch, MD   Chief Complaint  Patient presents with  . New Evaluation    Referred by Dr. Merrilyn [name] for DOE; referral for ECHO  pt c/o heart racing; occasional dizziness when changing positions; DOE      History of Present Illness: Shannon [name] is a 60 y.o. female with hypertension, asthma, OSA, prior PE and HIV (well-controlled)  who presents for an evaluation of shortness of breath.  Shannon [name] saw her infectious disease doctor, Shannon Basques, MD, on 12/26/15.  Dr. Baxter [name] was concerned about her lower extremity edema and shortness of breath.  She was started on lasix 20 mg every other day.  Her main complaint is shortness of breath. She was hospitalized in November for pneumonia. Since that time she continues to have shortness of breath.  She attributes this to her lipodystrophy and central adiposity.  She denies lower extremity edema or orthopnea. She does, however notice chest pain and tightness when she lays down at night. She also has chest discomfort that sometimes occurs with exertion. These episodes last for a few seconds at a time and are in her substernal region. They're associated with worsening shortness of breath and are sharp in nature. It is associated with nausea but no diaphoresis. This happens approximately once per week.  She does not get any formal exercise and thinks that this may be why she is gaining weight. She has been tried to limit her sugar intake and does not eat red meat.   Shannon [name] brings her cardiac records for review. She had a Lexiscan Cardiolite in September 2013 that revealed a medium, mild reversible defect in the anterior wall and normal systolic function. She subsequently underwent cardiac catheterization that revealed nonobstructive coronary disease. She had mild pulmonary hypertension and a normal cardiac  output. Left ventriculography revealed mild to moderate aortic regurgitation. She had an echo May 2016 that revealed an ejection fraction of 44% and mild diastolic dysfunction. It also revealed calcification of the aortic valve, mild to moderate aortic regurgitation, and mild aortic stenosis. Peak velocity was 2.1 m/s. She had a 24-hour Holter in February 2015 that revealed rare PACs and PVCs.   Past Medical History  Diagnosis Date  . HIV antibody positive ([place])   . Hypertension   . Hyperlipidemia   . Heart murmur   . Chronic asthma with acute exacerbation     "I have chronic asthma all the time; sometimes exacerbations" (07/09/2015)  . Pneumonia 07/09/2015  . Sleep apnea     "never completed part 2 of study; never wore mask" (07/09/2015)  . Anemia   . History of blood transfusion     "related to my brain surgery I think"  . HIV disease ([place])   . GERD (gastroesophageal reflux disease)   . Arthritis     "starting to; in my hands" (07/09/2015)  . Chronic lower back pain   . Anxiety   . Cyst of right kidney     "3 of them; dx'd in ~ 01/2015"    Past Surgical History  Procedure Laterality Date  . Cardiac catheterization    . Tonsillectomy and adenoidectomy    . Abdominal hysterectomy      "robotic laparosopic"  . Brain surgery  1974    "brain tumor; benign; on top of my brain; got a plate  in there"     Current Outpatient Prescriptions  Medication Sig Dispense Refill  . albuterol (PROAIR HFA) 108 (90 Base) MCG/ACT inhaler Inhale 2 puffs into the lungs every 6 (six) hours as needed for wheezing or shortness of breath. 6.7 g 3  . ALPRAZolam (XANAX) 0.5 MG tablet Take 1 tablet (0.5 mg total) by mouth at bedtime. 90 tablet 0  . Biotin 1000 MCG tablet Take 500 mcg by mouth 1 day or 1 dose. Takes every 2-3 days    . Coenzyme Q10 (COQ10 PO) Take 1 tablet by mouth daily.    . cyanocobalamin (,VITAMIN B-12,) 1000 MCG/ML injection Inject 1 mL (1,000 mcg total) into the muscle every 30  (thirty) days. 1 mL 3  . dolutegravir (TIVICAY) 50 MG tablet Take 1 tablet (50 mg total) by mouth daily. 30 tablet 11  . emtricitabine-tenofovir AF (DESCOVY) 200-25 MG tablet Take 1 tablet by mouth daily. 30 tablet 11  . ENSURE (ENSURE) Take 237 mLs by mouth 2 (two) times daily between meals. 237 mL 11  . furosemide (LASIX) 20 MG tablet Take 1 tablet by mouth every other day.  0  . ipratropium-albuterol (DUONEB) 0.5-2.5 (3) MG/3ML SOLN Inhale 3 mLs into the lungs every 6 (six) hours as needed.    [name] L-Lysine 500 MG CAPS Take 1 capsule by mouth once a week.    . losartan (COZAAR) 100 MG tablet Take 1 tablet (100 mg total) by mouth daily. 90 tablet 0  . Multiple Vitamin (MULTIVITAMIN WITH MINERALS) TABS tablet Take 1 tablet by mouth daily. 90 tablet   . Multiple Vitamins-Minerals (ZINC) LOZG Take 1 lozenge by mouth daily. 100 lozenge 0  . NIFEdipine (PROCARDIA) 10 MG capsule Take 1 capsule (10 mg total) by mouth daily. 90 capsule 0  . omeprazole (PRILOSEC) 40 MG capsule TAKE ONE CAPSULE BY MOUTH EVERY DAY 90 capsule 0  . POTASSIUM PO Take 1 tablet by mouth daily.    . SYRINGE/NEEDLE, DISP, 1 ML (B-D SYRINGE/NEEDLE 1CC/25GX5/8) 25G X 5/8" 1 ML MISC 1 Units by Does not apply route every 30 (thirty) days. 50 each 0  . Tiotropium Bromide Monohydrate (SPIRIVA RESPIMAT) 2.5 MCG/ACT AERS Inhale 1 puff into the lungs daily. 1 Inhaler 0  . vitamin C (ASCORBIC ACID) 500 MG tablet Take 2 tablets (1,000 mg total) by mouth daily. 180 tablet 0  . ZETIA 10 MG tablet Take 1 tablet by mouth  daily 90 tablet 0   No current facility-administered medications for this visit.    Allergies:   Ciprofloxacin    Social History:  The patient  reports that she has never smoked. She has never used smokeless tobacco. She reports that she drinks alcohol. She reports that she does not use illicit drugs.   Family History:  The patient's family history includes Asthma in her mother; Heart failure in her mother; Heart murmur in  her sister.    ROS:  Please see the history of present illness.   Otherwise, review of systems are positive for none.   All other systems are reviewed and negative.    PHYSICAL EXAM: VS:  BP 190/112 mmHg  Pulse 89  Ht '5\' 4"'$  (1.626 m)  Wt 103.329 kg (227 lb 12.8 oz)  BMI 39.08 kg/m2 , BMI Body mass index is 39.08 kg/(m^2). GENERAL:  Well appearing HEENT:  Pupils equal round and reactive, fundi not visualized, oral mucosa unremarkable NECK:  No jugular venous distention, waveform within normal limits, carotid upstroke brisk and symmetric,  no bruits, no thyromegaly LYMPHATICS:  No cervical adenopathy LUNGS:  Clear to auscultation bilaterally HEART:  RRR.  PMI not displaced or sustained,S1 and S2 within normal limits, no S3, no S4, no clicks, no rubs, II/VI early-peaking systolic murmur at the right upper sternal border. I/IV diastolic murmur at the left upper sternal border.  ABD:  Flat, positive bowel sounds normal in frequency in pitch, no bruits, no rebound, no guarding, no midline pulsatile mass, no hepatomegaly, no splenomegaly EXT:  2 plus pulses throughout, no edema, no cyanosis no clubbing SKIN:  No rashes no nodules NEURO:  Cranial nerves II through XII grossly intact, motor grossly intact throughout PSYCH:  Cognitively intact, oriented to person place and time   EKG:  EKG is ordered today. The ekg ordered today desinus rhythm. Rate 89 bpm. 2 PVCs.  Coronary angiography 06/01/12:10-20% OM, diffuse 20-30% LAD, 10-20% D1, 20% diffuse RCA RA 10, RV 39/12, RV EDP 12, capillary wedge pressure 19, PA 39/21, mean 27, LV 116/14, LVEDP 20, aorta 115/77 Cardiac output (Fick) 6.55, cardiac index 3.2 PVR 1.7 with units, SVR 12 with units. She P/QRS ratio 1 LVEF 80%. Aortic root mildly dilated. 1-2+ aortic regurgitation.  24-hour Holter 09/26/13: Sinus rhythm, rare PACs, rare PVCs  Lexiscan Cardiolite 05/23/12: Medium-sized, mild reversible defect in the anterior wall. LVEF 54%.  Echo  01/22/15: LVEF 70%. Mild LVH. Mild diastolic dysfunction. Normal RV function. Left atrium moderately enlarged. Right atrium mildly enlarged. Trace mitral regurgitation. Calcific aortic valve. Mild to moderate aortic regurgitation. Mild aortic stenosis. Peak velocity 2.1 m/s trace tricuspid regurgitation    Recent Labs: 08/08/2015: TSH 0.690 11/28/2015: ALT 16; Hemoglobin 11.8; Platelets 172 12/26/2015: BUN 17; Creat 1.16*; Potassium 4.0; Sodium 141    Lipid Panel    Component Value Date/Time   CHOL 174 11/15/2015 0952   TRIG 75 11/15/2015 0952   HDL 60 11/15/2015 0952   CHOLHDL 2.9 11/15/2015 0952   VLDL 15 11/15/2015 0952   LDLCALC 99 11/15/2015 0952      Wt Readings from Last 3 Encounters:  12/31/15 103.329 kg (227 lb 12.8 oz)  12/27/15 102.967 kg (227 lb)  12/26/15 103.42 kg (228 lb)      ASSESSMENT AND PLAN:  # Hypertension:  BP is poorly-controlled.  However, she has not yet taken her antihypertensives today.  I have asked her to take her blood pressure medicine this evening she gets home. She will monitor her blood pressure twice a day for 2 weeks. She will return in 2 weeks to see our pharmacist in the hypertension clinic.  # Shortness of breath: Ms. Martinovich has shortness of breath but her exam is not consistent with heart failure. We will obtain an echocardiogram to evaluate for systolic or diastolic dysfunction as well as progressive valvular heart disease. She is euvolemic on exam and her BNP was within normal limits on April 6.  This makes it very unlikely that heart failure as the cause of her symptoms. Continue Lasix every other day.  I suspect that some of her shortness of breath may be attributable to poorly controlled hypertension. Her body habitus also does make it harder for her to breathe as she has a lot of central adiposity. As long as her heart is stable I like for her to start increasing her exercise to at least 30-40 minutes most days of the week. With weight loss her  breathing should improve.  # Atypical Chest pain: Ms. Quain's symptoms are very atypical and do not have  any clear exertional component. However, she has several risk factors for coronary artery disease including HIV, hypertension, obesity,  and hyperlipidemia.  She does not think that she would be able to walk on a treadmill for a long period of time. We will obtain a Lexiscan Cardiolite to evaluate for ischemia. In 2013 she had a mild anterior wall defect that in retrospect was probably breast attenuation. Subsequent cath did not show obstructive coronary disease.  # Aortic regurgitation # Aortic stenosis: Ms. Maniscalco has mild aortic stenosis and mild to moderate aortic regurgitation. She does not have evidence of heart failure on exam. However she does have increased shortness of breath. We will obtain an echo to evaluate for progressive valvular heart disease.  # PVCs: Ms. Windish has PVCs on EKG but they are asymptomatic.  CBC and basic metabolic panel are unremarkable.   Current medicines are reviewed at length with the patient today.  The patient does not have concerns regarding medicines.  The following changes have been made:  no change  Labs/ tests ordered today include:   Orders Placed This Encounter  Procedures  . Myocardial Perfusion Imaging  . EKG 12-Lead  . ECHOCARDIOGRAM COMPLETE     Disposition:   FU with Joyell Emami C. Oval Linsey, MD, [facility] in 3 months.  Follow up in hypertension clinic in 2 weeks.     This note was written with the assistance of speech recognition software.  Please excuse any transcriptional errors.  Signed, Willmar Stockinger C. Oval Linsey, MD, [facility]  12/31/2015 6:10 PM    [facility] [place] any rhyme or reason why leg cyanosis or no shows and Sundays everybody, does not like one daily Plaquenil is

## 2016-01-01 ENCOUNTER — Encounter: Payer: Self-pay | Admitting: Cardiovascular Disease

## 2016-01-02 ENCOUNTER — Ambulatory Visit (INDEPENDENT_AMBULATORY_CARE_PROVIDER_SITE_OTHER): Payer: Medicare Other | Admitting: Pulmonary Disease

## 2016-01-02 ENCOUNTER — Encounter: Payer: Self-pay | Admitting: Pulmonary Disease

## 2016-01-02 ENCOUNTER — Telehealth: Payer: Self-pay | Admitting: Pulmonary Disease

## 2016-01-02 ENCOUNTER — Ambulatory Visit (INDEPENDENT_AMBULATORY_CARE_PROVIDER_SITE_OTHER)
Admission: RE | Admit: 2016-01-02 | Discharge: 2016-01-02 | Disposition: A | Discharge status: Home / Self Care | Payer: Medicare Other | Source: Ambulatory Visit | Attending: Pulmonary Disease | Admitting: Pulmonary Disease

## 2016-01-02 VITALS — Ht 64.0 in | Wt 226.0 lb

## 2016-01-02 DIAGNOSIS — J189 Pneumonia, unspecified organism: Secondary | ICD-10-CM | POA: Diagnosis not present

## 2016-01-02 DIAGNOSIS — R05 Cough: Secondary | ICD-10-CM | POA: Insufficient documentation

## 2016-01-02 DIAGNOSIS — J4540 Moderate persistent asthma, uncomplicated: Secondary | ICD-10-CM | POA: Diagnosis not present

## 2016-01-02 DIAGNOSIS — G4733 Obstructive sleep apnea (adult) (pediatric): Secondary | ICD-10-CM | POA: Diagnosis not present

## 2016-01-02 DIAGNOSIS — R0609 Other forms of dyspnea: Secondary | ICD-10-CM | POA: Diagnosis not present

## 2016-01-02 DIAGNOSIS — E669 Obesity, unspecified: Secondary | ICD-10-CM | POA: Diagnosis not present

## 2016-01-02 DIAGNOSIS — B20 Human immunodeficiency virus [HIV] disease: Secondary | ICD-10-CM

## 2016-01-02 DIAGNOSIS — R0600 Dyspnea, unspecified: Secondary | ICD-10-CM | POA: Diagnosis not present

## 2016-01-02 DIAGNOSIS — R059 Cough, unspecified: Secondary | ICD-10-CM | POA: Insufficient documentation

## 2016-01-02 DIAGNOSIS — K219 Gastro-esophageal reflux disease without esophagitis: Secondary | ICD-10-CM | POA: Insufficient documentation

## 2016-01-02 MED ORDER — BUDESONIDE 0.5 MG/2ML IN SUSP: 0.5000 mg | Freq: Two times a day (BID) | RESPIRATORY_TRACT | Status: DC

## 2016-01-02 MED ORDER — FLUTICASONE PROPIONATE 50 MCG/ACT NA SUSP: 2.0000 | Freq: Every day | NASAL | Status: DC

## 2016-01-02 NOTE — Assessment & Plan Note (Addendum)
Patient was diagnosed with obstructive sleep apnea more than 10 years ago. She has hypersomnia, snoring, gasping, choking, fatigue. She had a sleep study done in [place]. She never had a CPAP  Study done. Her hypersomnia has gotten worse. Hypersomnia affects her functionality. -- Plan for a split night sleep study. Pt unable to do HST last month.  -- Advised on good sleep hygiene. -- Advised not to engage in activities requiring vigilance if she is sleepy.

## 2016-01-02 NOTE — Telephone Encounter (Signed)
Please advise Vita Barley where this patient can be worked in. Thanks.

## 2016-01-02 NOTE — Patient Instructions (Addendum)
1. We will order you Pulmicort 0.5 mg via neb 2x/d and Duoneb 4x/d and every 4 hrs as needed.  2. We will schedule you a split night sleep study.  3. We will order you Flonase 2 squirts/nostril at bedtime.   Return to clinic in 6weeks.

## 2016-01-02 NOTE — Assessment & Plan Note (Addendum)
Currently follows with ID.

## 2016-01-02 NOTE — Assessment & Plan Note (Signed)
Has UACS. Start flonase.

## 2016-01-02 NOTE — Assessment & Plan Note (Addendum)
Patient with recent dyspnea. Had pneumonia in October 2016. Chest x-ray with right lower lobe infiltrate, persistent.  Pt being seen by Cards. CXR today (01/02/2016)  Some pulm edema. Pt clinically is better with lasix 20 mg/d since [place]. Some wheezing today and edema > I think it is related to fluid overload.  Plan : 1. Told pt to call her PCP re: daily lasix. May even need 40 mg of lasix daily. 2. F/u with cards. Needs echo.  3. Pt to call if worse. May need pred taper then +/- Abx. Recently on zpak though.

## 2016-01-02 NOTE — Progress Notes (Signed)
Subjective:    Patient ID: Shannon [name], female    DOB: [birth year], 60 y.o.   MRN: [id number]  HPI Pt is being seen for asthma, OSA, exertional dyspnea.   DATA PFT (12/04/15)  FEV1/FVC 50%,  FEV1  0.64  31%. Some BD response. DLCO 66%.   ROV (12/04/15) Pt returns to office after being seen in 09/2015 for hypersomnia, asthma. Has been sick with "PNA" since last seen. Stopped symbicort 2/2 heart racing.  Still with cough and congestion. She feels better with proair > uses it 3x/week. Also has anxiety attacks. Has not been admitted since last seen. HIV meds have been changed. Took bactrim and augmentin for cough/congestion.   ROV (01/02/16) Pt returns to clinic as f/u on her OSA, asthma.   Since last seen, she states she remains SOB and hoarse. Uses spiriva. She thinks she retains fluid. Saw heart MD. No recent admission. Received zpak in 11/2015. Issues using MDIs.   Review of Systems  Constitutional: Negative.   HENT: Positive for congestion and postnasal drip.   Eyes: Negative.   Respiratory: Positive for cough and shortness of breath.   Cardiovascular: Negative.   Gastrointestinal: Negative.   Endocrine: Negative.   Genitourinary: Negative.   Musculoskeletal: Negative.   Allergic/Immunologic: Negative.   Neurological: Negative.   Hematological: Negative.   Psychiatric/Behavioral: Negative.   All other systems reviewed and are negative.      Objective:   Physical Exam Vitals:  Filed Vitals:   01/02/16 1551  Height: '5\' 4"'$  (1.626 m)  Weight: 226 lb (102.513 kg)    Constitutional/General:  Pleasant, well-nourished, well-developed, not in any distress,  Comfortably seating.  Well kempt  Body mass index is 38.77 kg/(m^2). Wt Readings from Last 3 Encounters:  01/02/16 226 lb (102.513 kg)  12/31/15 227 lb 12.8 oz (103.329 kg)  12/27/15 227 lb (102.967 kg)      HEENT: Pupils equal and reactive to light and accommodation. Anicteric sclerae. Normal nasal mucosa.   No oral   lesions,  mouth clear,  oropharynx clear, no postnasal drip. (-) Oral thrush. No dental caries.  Airway - Mallampati class IV  Neck: No masses. Midline trachea. No JVD, (-) LAD. (-) bruits appreciated.  Respiratory/Chest: Grossly normal chest. (-) deformity. (-) Accessory muscle use.  Symmetric expansion. (-) Tenderness on palpation.  Resonant on percussion.  Diminished BS on both lower lung zones. (-)crackles, rhonchi. (=) wheezing.  (-) egophony  Cardiovascular: Regular rate and  rhythm, heart sounds normal, no murmur or gallops,(+) peripheral edema  Gastrointestinal:  Normal bowel sounds. Soft, non-tender. No hepatosplenomegaly.  (-) masses.   Musculoskeletal:  Normal muscle tone. Normal gait.   Extremities: Grossly normal. (-) clubbing, cyanosis.  (+)  edema  Skin: (-) rash,lesions seen.   Neurological/Psychiatric : alert, oriented to time, place, person. Normal mood and affect             Assessment & Plan:  Asthma, moderate persistent Patient is a nonsmoker. Dxed with asthma when she was in her 91s. Triggers: extreme change in weather. Her asthma has beed unstable . She moved from [place] to [place] in Oct 2016.  PFT (12/04/15)  FEV1/FVC 50%,  FEV1  0.64  31%. Some BD response. DLCO 66%. Pt stopped symbicort since she felt her heart was racing. She states she is better with alb prn. She also does not like prednisone.  Spiriva dd not help much. She has issues with MDIs.   Plan : 1.start pulmicort neb 0.5 mg BID.  2. Cont duoneb qid and q4 prn. 3. Needs abg later on. On osa is treated.  4. May need pred taper if not better with diuresis. Told pt to call.     CAP (community acquired pneumonia) Patient had pneumonia in October 2016 which seemed to have lingered for a couple months. She took Levaquin back in October. She had Bactrim again in January. Chest x-ray in November 2016 and December 2016 showed right lower lobe infiltrate. Chest CT scan (10/2015) right middle  lobe infiltrate. Looks like it's chronic. Low lung volumes. Recent  Bactrim and Augmentin and ZPAK.   CXR 01/02/16 no pna. Inc vascular marking. Diuresce for now.    OSA (obstructive sleep apnea) Patient was diagnosed with obstructive sleep apnea more than 10 years ago. She has hypersomnia, snoring, gasping, choking, fatigue. She had a sleep study done in [place]. She never had a CPAP  Study done. Her hypersomnia has gotten worse. Hypersomnia affects her functionality. -- Plan for a split night sleep study. Pt unable to do HST last month.  -- Advised on good sleep hygiene. -- Advised not to engage in activities requiring vigilance if she is sleepy.       Exertional dyspnea Patient with recent dyspnea. Had pneumonia in October 2016. Chest x-ray with right lower lobe infiltrate, persistent.  Pt being seen by Cards. CXR today (01/02/2016)  Some pulm edema. Pt clinically is better with lasix 20 mg/d since [place]. Some wheezing today and edema > I think it is related to fluid overload.  Plan : 1. Told pt to call her PCP re: daily lasix. May even need 40 mg of lasix daily. 2. F/u with cards. Needs echo.  3. Pt to call if worse. May need pred taper then +/- Abx. Recently on zpak though.   HIV disease ([place]) Currently follows with ID.   Cough Has UACS. Start flonase.   GERD (gastroesophageal reflux disease) PPI at HS.   Obesity Weight reduction.      I personally reviewed previous images (Chest Xray, Chest Ct scan) done on this patient. I reviewed the reports on the images as well.   I spent at least 40 minutes with the patient today and more than 50% was spent counseling the patient regarding disease and management and facilitating labs and medications.     Return to clinic in 6 weeks  J. Shirl Harris, MD 01/02/2016, 5:18 PM [facility] Pager [phone] After 3 pm or if no answer, call [phone]

## 2016-01-02 NOTE — Assessment & Plan Note (Signed)
Weight reduction 

## 2016-01-02 NOTE — Assessment & Plan Note (Signed)
PPI at HS.  

## 2016-01-02 NOTE — Assessment & Plan Note (Addendum)
Patient is a nonsmoker. Dxed with asthma when she was in her 73s. Triggers: extreme change in weather. Her asthma has beed unstable . She moved from [place] to [place] in Oct 2016.  PFT (12/04/15)  FEV1/FVC 50%,  FEV1  0.64  31%. Some BD response. DLCO 66%. Pt stopped symbicort since she felt her heart was racing. She states she is better with alb prn. She also does not like prednisone.  Spiriva dd not help much. She has issues with MDIs.   Plan : 1.start pulmicort neb 0.5 mg BID. 2. Cont duoneb qid and q4 prn. 3. Needs abg later on. On osa is treated.  4. May need pred taper if not better with diuresis. Told pt to call.

## 2016-01-02 NOTE — Assessment & Plan Note (Addendum)
Patient had pneumonia in October 2016 which seemed to have lingered for a couple months. She took Levaquin back in October. She had Bactrim again in January. Chest x-ray in November 2016 and December 2016 showed right lower lobe infiltrate. Chest CT scan (10/2015) right middle lobe infiltrate. Looks like it's chronic. Low lung volumes. Recent  Bactrim and Augmentin and ZPAK.   CXR 01/02/16 no pna. Inc vascular marking. Diuresce for now.

## 2016-01-03 NOTE — Telephone Encounter (Signed)
Appt made. Pt aware. Nothing further needed.

## 2016-01-06 DIAGNOSIS — Z01419 Encounter for gynecological examination (general) (routine) without abnormal findings: Secondary | ICD-10-CM | POA: Diagnosis not present

## 2016-01-06 DIAGNOSIS — Z124 Encounter for screening for malignant neoplasm of cervix: Secondary | ICD-10-CM | POA: Diagnosis not present

## 2016-01-06 DIAGNOSIS — Z6839 Body mass index (BMI) 39.0-39.9, adult: Secondary | ICD-10-CM | POA: Diagnosis not present

## 2016-01-07 ENCOUNTER — Other Ambulatory Visit: Payer: Medicare Other

## 2016-01-07 DIAGNOSIS — B20 Human immunodeficiency virus [HIV] disease: Secondary | ICD-10-CM

## 2016-01-08 ENCOUNTER — Encounter (HOSPITAL_COMMUNITY): Payer: Self-pay | Admitting: Emergency Medicine

## 2016-01-08 ENCOUNTER — Emergency Department (HOSPITAL_COMMUNITY): Payer: Medicare Other

## 2016-01-08 ENCOUNTER — Emergency Department (HOSPITAL_COMMUNITY)
Admission: EM | Admit: 2016-01-08 | Discharge: 2016-01-08 | Disposition: A | Discharge status: Home / Self Care | Payer: Medicare Other | Attending: Emergency Medicine | Admitting: Emergency Medicine

## 2016-01-08 DIAGNOSIS — R079 Chest pain, unspecified: Secondary | ICD-10-CM

## 2016-01-08 DIAGNOSIS — K219 Gastro-esophageal reflux disease without esophagitis: Secondary | ICD-10-CM | POA: Diagnosis not present

## 2016-01-08 DIAGNOSIS — I10 Essential (primary) hypertension: Secondary | ICD-10-CM | POA: Diagnosis not present

## 2016-01-08 DIAGNOSIS — F419 Anxiety disorder, unspecified: Secondary | ICD-10-CM | POA: Diagnosis not present

## 2016-01-08 DIAGNOSIS — B20 Human immunodeficiency virus [HIV] disease: Secondary | ICD-10-CM | POA: Diagnosis not present

## 2016-01-08 DIAGNOSIS — Z8701 Personal history of pneumonia (recurrent): Secondary | ICD-10-CM | POA: Diagnosis not present

## 2016-01-08 DIAGNOSIS — M1990 Unspecified osteoarthritis, unspecified site: Secondary | ICD-10-CM | POA: Insufficient documentation

## 2016-01-08 DIAGNOSIS — R0789 Other chest pain: Secondary | ICD-10-CM | POA: Diagnosis not present

## 2016-01-08 DIAGNOSIS — R011 Cardiac murmur, unspecified: Secondary | ICD-10-CM | POA: Diagnosis not present

## 2016-01-08 DIAGNOSIS — Z79899 Other long term (current) drug therapy: Secondary | ICD-10-CM | POA: Insufficient documentation

## 2016-01-08 DIAGNOSIS — R05 Cough: Secondary | ICD-10-CM | POA: Diagnosis not present

## 2016-01-08 DIAGNOSIS — R2240 Localized swelling, mass and lump, unspecified lower limb: Secondary | ICD-10-CM | POA: Diagnosis not present

## 2016-01-08 DIAGNOSIS — J40 Bronchitis, not specified as acute or chronic: Secondary | ICD-10-CM

## 2016-01-08 DIAGNOSIS — Z7951 Long term (current) use of inhaled steroids: Secondary | ICD-10-CM | POA: Insufficient documentation

## 2016-01-08 DIAGNOSIS — Q6100 Congenital renal cyst, unspecified: Secondary | ICD-10-CM | POA: Insufficient documentation

## 2016-01-08 DIAGNOSIS — J45901 Unspecified asthma with (acute) exacerbation: Secondary | ICD-10-CM | POA: Insufficient documentation

## 2016-01-08 DIAGNOSIS — Z862 Personal history of diseases of the blood and blood-forming organs and certain disorders involving the immune mechanism: Secondary | ICD-10-CM | POA: Diagnosis not present

## 2016-01-08 DIAGNOSIS — G8929 Other chronic pain: Secondary | ICD-10-CM | POA: Diagnosis not present

## 2016-01-08 DIAGNOSIS — R0602 Shortness of breath: Secondary | ICD-10-CM | POA: Diagnosis not present

## 2016-01-08 LAB — COMPREHENSIVE METABOLIC PANEL
ALT: 25 U/L (ref 14–54)
ANION GAP: 11 (ref 5–15)
AST: 25 U/L (ref 15–41)
Albumin: 3.5 g/dL (ref 3.5–5.0)
Alkaline Phosphatase: 72 U/L (ref 38–126)
BUN: 11 mg/dL (ref 6–20)
CHLORIDE: 99 mmol/L — AB (ref 101–111)
CO2: 30 mmol/L (ref 22–32)
Calcium: 9.8 mg/dL (ref 8.9–10.3)
Creatinine, Ser: 1.04 mg/dL — ABNORMAL HIGH (ref 0.44–1.00)
GFR, EST NON AFRICAN AMERICAN: 57 mL/min — AB (ref >60)
Glucose, Bld: 92 mg/dL (ref 65–99)
POTASSIUM: 4 mmol/L (ref 3.5–5.1)
SODIUM: 140 mmol/L (ref 135–145)
Total Bilirubin: 0.6 mg/dL (ref 0.3–1.2)
Total Protein: 6.4 g/dL — ABNORMAL LOW (ref 6.5–8.1)

## 2016-01-08 LAB — I-STAT TROPONIN, ED
TROPONIN I, POC: 0 ng/mL (ref 0.00–0.08)
TROPONIN I, POC: 0 ng/mL (ref 0.00–0.08)

## 2016-01-08 LAB — CBC WITH DIFFERENTIAL/PLATELET
BASOS ABS: 0 10*3/uL (ref 0.0–0.1)
BASOS PCT: 0 %
Eosinophils Absolute: 0.1 10*3/uL (ref 0.0–0.7)
Eosinophils Relative: 3 %
HEMATOCRIT: 38.8 % (ref 36.0–46.0)
HEMOGLOBIN: 12.6 g/dL (ref 12.0–15.0)
LYMPHS PCT: 34 %
Lymphs Abs: 1.7 10*3/uL (ref 0.7–4.0)
MCH: 29.8 pg (ref 26.0–34.0)
MCHC: 32.5 g/dL (ref 30.0–36.0)
MCV: 91.7 fL (ref 78.0–100.0)
Monocytes Absolute: 0.5 10*3/uL (ref 0.1–1.0)
Monocytes Relative: 9 %
NEUTROS ABS: 2.7 10*3/uL (ref 1.7–7.7)
NEUTROS PCT: 54 %
PLATELETS: 198 10*3/uL (ref 150–400)
RBC: 4.23 MIL/uL (ref 3.87–5.11)
RDW: 13.6 % (ref 11.5–15.5)
WBC: 5 10*3/uL (ref 4.0–10.5)

## 2016-01-08 LAB — T-HELPER CELL (CD4) - (RCID CLINIC ONLY)
CD4 % Helper T Cell: 35 % (ref 33–55)
CD4 T CELL ABS: 470 /uL (ref 400–2700)

## 2016-01-08 MED ORDER — DM-GUAIFENESIN ER 30-600 MG PO TB12: 1.0000 | ORAL_TABLET | Freq: Two times a day (BID) | ORAL | Status: DC

## 2016-01-08 MED ORDER — IPRATROPIUM-ALBUTEROL 0.5-2.5 (3) MG/3ML IN SOLN
3.0000 mL | Freq: Once | RESPIRATORY_TRACT | Status: AC
  Administered 2016-01-08: 3 mL via RESPIRATORY_TRACT
  Filled 2016-01-08: qty 3

## 2016-01-08 MED ORDER — METHYLPREDNISOLONE SODIUM SUCC 125 MG IJ SOLR
125.0000 mg | Freq: Once | INTRAMUSCULAR | Status: AC
  Administered 2016-01-08: 125 mg via INTRAVENOUS
  Filled 2016-01-08: qty 2

## 2016-01-08 MED ORDER — SODIUM CHLORIDE 0.9 % IV BOLUS (SEPSIS)
500.0000 mL | Freq: Once | INTRAVENOUS | Status: AC
  Administered 2016-01-08: 500 mL via INTRAVENOUS

## 2016-01-08 MED ORDER — SODIUM CHLORIDE 0.9 % IV SOLN
INTRAVENOUS | Status: DC
  Administered 2016-01-08: 12:00:00 via INTRAVENOUS

## 2016-01-08 MED ORDER — PREDNISONE 10 MG PO TABS: 40.0000 mg | ORAL_TABLET | Freq: Every day | ORAL | Status: DC

## 2016-01-08 NOTE — Discharge Instructions (Signed)
Chest x-ray negative for pneumonia. 2 cardiac markers both negative surgeon for any cardiac event or heart attack. Symptoms consistent with bronchitis. Continue your C pack until completed. Take the prednisone as directed for the next 5 days. Also taking Mucinex DM as needed for cough and phlegm. Also continue albuterol inhaler every 6 hours for the next 7 days. Blood pressures been consistently elevated here but no complicating factors. Follow-up with your primary care doctor to have this rechecked when you're feeling better and to make sure that he gets back on track. Would recommend make an appointment in the next few days.

## 2016-01-08 NOTE — ED Notes (Signed)
Pt sts cough and congestion; pt sts HA and body aches; pt sts productive cough with green sputum

## 2016-01-08 NOTE — ED Provider Notes (Signed)
CSN: [id number]     Arrival date & time 01/08/16  1020 History   First MD Initiated Contact with Patient 01/08/16 1043     Chief Complaint  Patient presents with  . Cough  . Shortness of Breath     (Consider location/radiation/quality/duration/timing/severity/associated sxs/prior Treatment) Patient is a 60 y.o. female presenting with cough and shortness of breath. The history is provided by the patient.  Cough Associated symptoms: chest pain, headaches and shortness of breath   Associated symptoms: no fever and no rash   Shortness of Breath Associated symptoms: chest pain, cough and headaches   Associated symptoms: no abdominal pain, no fever, no rash and no vomiting   patient with a history of HIV currently on antivirals. Followed by infectious disease here. Patient with onset of chest pain this morning at 8:30 on the left side made some worse with breathing. Associated with a bronchitis type illness with shortness of breath and productive cough for several days. Patient had negative chest x-ray on April 11. Patient also relates to a mild headache. No stroke symptoms. However states blood pressure has been fairly consistently elevated with the diastolic pressures around  110. Patient is still taking her medicine. Patient with long history of asthma. Using albuterol at home. Currently not on any oral steroids.    Past Medical History  Diagnosis Date  . HIV antibody positive ([place])   . Hypertension   . Hyperlipidemia   . Heart murmur   . Chronic asthma with acute exacerbation     "I have chronic asthma all the time; sometimes exacerbations" (07/09/2015)  . Pneumonia 07/09/2015  . Sleep apnea     "never completed part 2 of study; never wore mask" (07/09/2015)  . Anemia   . History of blood transfusion     "related to my brain surgery I think"  . HIV disease ([place])   . GERD (gastroesophageal reflux disease)   . Arthritis     "starting to; in my hands" (07/09/2015)  . Chronic lower  back pain   . Anxiety   . Cyst of right kidney     "3 of them; dx'd in ~ 01/2015"   Past Surgical History  Procedure Laterality Date  . Cardiac catheterization    . Tonsillectomy and adenoidectomy    . Abdominal hysterectomy      "robotic laparosopic"  . Brain surgery  1974    "brain tumor; benign; on top of my brain; got a plate in there"   Family History  Problem Relation Age of Onset  . Asthma Mother   . Heart failure Mother     cardiomyopathy  . Heart murmur Sister   . Heart murmur Brother   . Diabetes Sister   . Thyroid disease Sister   . Heart murmur Sister    Social History  Substance Use Topics  . Smoking status: Never Smoker   . Smokeless tobacco: Never Used  . Alcohol Use: 0.0 oz/week    0 Standard drinks or equivalent per week     Comment: Rarely.   OB History    No data available     Review of Systems  Constitutional: Negative for fever.  HENT: Positive for congestion.   Eyes: Negative for visual disturbance.  Respiratory: Positive for cough and shortness of breath.   Cardiovascular: Positive for chest pain and leg swelling.  Gastrointestinal: Negative for nausea, vomiting, abdominal pain and diarrhea.  Genitourinary: Negative for dysuria.  Musculoskeletal: Negative for back pain.  Skin: Negative  for rash.  Allergic/Immunologic: Positive for immunocompromised state.  Neurological: Positive for headaches.  Hematological: Does not bruise/bleed easily.  Psychiatric/Behavioral: Negative for confusion.      Allergies  Ciprofloxacin  Home Medications   Prior to Admission medications   Medication Sig Start Date End Date Taking? Authorizing Provider  albuterol (PROAIR HFA) 108 (90 Base) MCG/ACT inhaler Inhale 2 puffs into the lungs every 6 (six) hours as needed for wheezing or shortness of breath. 10/10/15  Yes [place], MD  Biotin 1000 MCG tablet Take 500 mcg by mouth 1 day or 1 dose. Takes every 2-3 days   Yes Historical Provider, MD   cyanocobalamin (,VITAMIN B-12,) 1000 MCG/ML injection Inject 1 mL (1,000 mcg total) into the muscle every 30 (thirty) days. 08/29/15  Yes Collier Salina, MD  dolutegravir (TIVICAY) 50 MG tablet Take 1 tablet (50 mg total) by mouth daily. 11/28/15  Yes [name], MD  emtricitabine-tenofovir AF (DESCOVY) 200-25 MG tablet Take 1 tablet by mouth daily. 11/28/15  Yes [name], MD  ENSURE (ENSURE) Take 237 mLs by mouth 2 (two) times daily between meals. 09/25/15  Yes Campbell Riches, MD  fluticasone (FLONASE) 50 MCG/ACT nasal spray Place 2 sprays into both nostrils daily. 01/02/16  Yes Jose Angelo A Corrie Dandy, MD  losartan (COZAAR) 100 MG tablet Take 1 tablet (100 mg total) by mouth daily. 08/29/15  Yes Collier Salina, MD  Multiple Vitamin (MULTIVITAMIN WITH MINERALS) TABS tablet Take 1 tablet by mouth daily. 08/29/15  Yes Collier Salina, MD  Multiple Vitamins-Minerals (ZINC) LOZG Take 1 lozenge by mouth daily. 08/29/15  Yes Collier Salina, MD  NIFEdipine (PROCARDIA) 10 MG capsule Take 1 capsule (10 mg total) by mouth daily. 08/29/15  Yes Collier Salina, MD  omeprazole (PRILOSEC) 40 MG capsule TAKE ONE CAPSULE BY MOUTH EVERY DAY 11/11/15  Yes Collier Salina, MD  SYRINGE/NEEDLE, DISP, 1 ML (B-D SYRINGE/NEEDLE 1CC/25GX5/8) 25G X 5/8" 1 ML MISC 1 Units by Does not apply route every 30 (thirty) days. 10/02/15  Yes Lucious Groves, DO  vitamin C (ASCORBIC ACID) 500 MG tablet Take 2 tablets (1,000 mg total) by mouth daily. 08/29/15  Yes Collier Salina, MD  ZETIA 10 MG tablet Take 1 tablet by mouth  daily 10/03/15  Yes Collier Salina, MD  ALPRAZolam Duanne Moron) 0.5 MG tablet Take 1 tablet (0.5 mg total) by mouth at bedtime. 08/05/15 07/31/16  Bartholomew Crews, MD  azithromycin (ZITHROMAX) 250 MG tablet Take as directed 01/06/16   Historical Provider, MD  B-D INSULIN SYRINGE 1CC/25GX1" 25G X 1" 1 ML MISC As directed 12/24/15   Historical Provider, MD  budesonide (PULMICORT) 0.5 MG/2ML nebulizer  solution Take 2 mLs (0.5 mg total) by nebulization 2 (two) times daily. 01/02/16   [place], MD  Coenzyme Q10 (COQ10 PO) Take 1 tablet by mouth daily.    Historical Provider, MD  dextromethorphan-guaiFENesin (MUCINEX DM) 30-600 MG 12hr tablet Take 1 tablet by mouth 2 (two) times daily. 01/08/16   Fredia Sorrow, MD  furosemide (LASIX) 20 MG tablet Take 1 tablet by mouth every other day. 11/28/15   Historical Provider, MD  ipratropium-albuterol (DUONEB) 0.5-2.5 (3) MG/3ML SOLN Inhale 3 mLs into the lungs every 6 (six) hours as needed. 08/08/15   Historical Provider, MD  L-Lysine 500 MG CAPS Take 1 capsule by mouth once a week.    Historical Provider, MD  POTASSIUM PO Take 1 tablet by  mouth daily.    Historical Provider, MD  predniSONE (DELTASONE) 10 MG tablet Take 4 tablets (40 mg total) by mouth daily. 01/08/16   Fredia Sorrow, MD  Tiotropium Bromide Monohydrate (SPIRIVA RESPIMAT) 2.5 MCG/ACT AERS Inhale 1 puff into the lungs daily. 12/04/15   Jose Angelo A de Dios, MD   BP 178/101 mmHg  Pulse 81  Temp(Src) 98.3 F (36.8 C) (Oral)  Resp 19  SpO2 96% Physical Exam  Constitutional: She is oriented to person, place, and time. She appears well-developed and well-nourished. No distress.  HENT:  Head: Normocephalic and atraumatic.  Mouth/Throat: Oropharynx is clear and moist.  Eyes: Conjunctivae and EOM are normal. Pupils are equal, round, and reactive to light.  Neck: Normal range of motion. Neck supple.  Cardiovascular: Normal rate, regular rhythm and normal heart sounds.   No murmur heard. Pulmonary/Chest: Effort normal and breath sounds normal. She has no wheezes. She exhibits tenderness.  Abdominal: Soft. Bowel sounds are normal. There is no tenderness.  Musculoskeletal: Normal range of motion. She exhibits no edema.  Neurological: She is alert and oriented to person, place, and time. No cranial nerve deficit. She exhibits normal muscle tone. Coordination normal.  Skin: Skin is  warm. No rash noted.  Nursing note and vitals reviewed.   ED Course  Procedures (including critical care time) Labs Review Labs Reviewed  COMPREHENSIVE METABOLIC PANEL - Abnormal; Notable for the following:    Chloride 99 (*)    Creatinine, Ser 1.04 (*)    Total Protein 6.4 (*)    GFR calc non Af Amer 57 (*)    All other components within normal limits  CBC WITH DIFFERENTIAL/PLATELET  I-STAT TROPOININ, ED  I-STAT TROPOININ, ED   Results for orders placed or performed during the hospital encounter of 01/08/16  CBC with Differential/Platelet  Result Value Ref Range   WBC 5.0 4.0 - 10.5 K/uL   RBC 4.23 3.87 - 5.11 MIL/uL   Hemoglobin 12.6 12.0 - 15.0 g/dL   HCT 38.8 36.0 - 46.0 %   MCV 91.7 78.0 - 100.0 fL   MCH 29.8 26.0 - 34.0 pg   MCHC 32.5 30.0 - 36.0 g/dL   RDW 13.6 11.5 - 15.5 %   Platelets 198 150 - 400 K/uL   Neutrophils Relative % 54 %   Neutro Abs 2.7 1.7 - 7.7 K/uL   Lymphocytes Relative 34 %   Lymphs Abs 1.7 0.7 - 4.0 K/uL   Monocytes Relative 9 %   Monocytes Absolute 0.5 0.1 - 1.0 K/uL   Eosinophils Relative 3 %   Eosinophils Absolute 0.1 0.0 - 0.7 K/uL   Basophils Relative 0 %   Basophils Absolute 0.0 0.0 - 0.1 K/uL  Comprehensive metabolic panel  Result Value Ref Range   Sodium 140 135 - 145 mmol/L   Potassium 4.0 3.5 - 5.1 mmol/L   Chloride 99 (L) 101 - 111 mmol/L   CO2 30 22 - 32 mmol/L   Glucose, Bld 92 65 - 99 mg/dL   BUN 11 6 - 20 mg/dL   Creatinine, Ser 1.04 (H) 0.44 - 1.00 mg/dL   Calcium 9.8 8.9 - 10.3 mg/dL   Total Protein 6.4 (L) 6.5 - 8.1 g/dL   Albumin 3.5 3.5 - 5.0 g/dL   AST 25 15 - 41 U/L   ALT 25 14 - 54 U/L   Alkaline Phosphatase 72 38 - 126 U/L   Total Bilirubin 0.6 0.3 - 1.2 mg/dL   GFR calc non Af  Amer 57 (L) >60 mL/min   GFR calc Af Amer >60 >60 mL/min   Anion gap 11 5 - 15  I-Stat Troponin, ED (not at [facility])  Result Value Ref Range   Troponin i, poc 0.00 0.00 - 0.08 ng/mL   Comment 3          I-Stat Troponin, ED (not at  [facility])  Result Value Ref Range   Troponin i, poc 0.00 0.00 - 0.08 ng/mL   Comment 3            Imaging Review Dg Chest 2 View  01/08/2016  CLINICAL DATA:  Shortness of breath for week EXAM: CHEST  2 VIEW COMPARISON:  01/02/2016 FINDINGS: Cardiac shadow is enlarged. Some scarring is noted in the left mid lung stable from the prior study. No focal infiltrate or sizable effusion is noted. No bony abnormality is seen. IMPRESSION: No acute abnormality noted. Electronically Signed   By: Inez Catalina M.D.   On: 01/08/2016 11:41   I have personally reviewed and evaluated these images and lab results as part of my medical decision-making.   EKG Interpretation   Date/Time:  Wednesday Jan 08 2016 10:29:23 EDT Ventricular Rate:  93 PR Interval:  166 QRS Duration: 82 QT Interval:  368 QTC Calculation: 458 R Axis:   -9 Text Interpretation:  Sinus rhythm Abnormal R-wave progression, early  transition No significant change since last tracing Reconfirmed by  Kiriana Worthington  MD, Garett Tetzloff [phone]) on 01/08/2016 10:54:41 AM      MDM   Final diagnoses:  Bronchitis  Chest pain, unspecified chest pain type  Essential hypertension    Patient presenting with cough congestion productive cough bodyaches mild headache not severe. Patient also with left-sided chest pain made worse by deep breath. Patient already on Z-Pak for bronchitis. Patient with long-standing history of asthma. Patient also history of hypertension. Blood pressure is been running high.  Workup here chest x-ray negative for pneumonia. Cardiac markers troponins 2 negative. EKG without any acute changes. Patient treated as a bronchitis. Patient had L Feverall Atrovent nebulizer with significant improvement in one dose of Cytomel draw 125 mg IV. Patient never had any wheezing but all this made her feel much better.  Patient's blood pressure remains elevated she can follow-up with her primary care doctor for this. No evidence of any end organ  problems. Based on her labs or chest x-ray. And patient's blood pressure may be elevated due to the acute bronchitis.    Fredia Sorrow, MD 01/08/16 [phone]

## 2016-01-16 ENCOUNTER — Ambulatory Visit (INDEPENDENT_AMBULATORY_CARE_PROVIDER_SITE_OTHER): Payer: Medicare Other | Admitting: Internal Medicine

## 2016-01-16 ENCOUNTER — Encounter: Payer: Medicare Other | Admitting: Pharmacist Clinician (PhC)/ Clinical Pharmacy Specialist

## 2016-01-16 ENCOUNTER — Telehealth (HOSPITAL_COMMUNITY): Payer: Self-pay

## 2016-01-16 ENCOUNTER — Encounter: Payer: Self-pay | Admitting: Internal Medicine

## 2016-01-16 VITALS — BP 202/97 | HR 97 | Temp 97.9°F | Ht 63.0 in | Wt 228.6 lb

## 2016-01-16 DIAGNOSIS — R0609 Other forms of dyspnea: Secondary | ICD-10-CM

## 2016-01-16 DIAGNOSIS — I10 Essential (primary) hypertension: Secondary | ICD-10-CM | POA: Diagnosis not present

## 2016-01-16 DIAGNOSIS — J4540 Moderate persistent asthma, uncomplicated: Secondary | ICD-10-CM

## 2016-01-16 MED ORDER — ALBUTEROL SULFATE (2.5 MG/3ML) 0.083% IN NEBU: 2.5000 mg | INHALATION_SOLUTION | Freq: Four times a day (QID) | RESPIRATORY_TRACT | Status: DC | PRN

## 2016-01-16 MED ORDER — TIOTROPIUM BROMIDE MONOHYDRATE 18 MCG IN CAPS: 18.0000 ug | ORAL_CAPSULE | Freq: Every day | RESPIRATORY_TRACT | Status: DC

## 2016-01-16 NOTE — Patient Instructions (Signed)
Please keep a log of your blood pressure  I want you to get the heart study and sleep study completed.  Please start using pulmicort.  You can start using Spriva daily  and Albuterol nebulizer instead of Duonebs.

## 2016-01-16 NOTE — Telephone Encounter (Signed)
Encounter complete. 

## 2016-01-21 ENCOUNTER — Encounter: Payer: Self-pay | Admitting: Internal Medicine

## 2016-01-21 ENCOUNTER — Ambulatory Visit (HOSPITAL_COMMUNITY)
Admission: RE | Admit: 2016-01-21 | Discharge: 2016-01-21 | Disposition: A | Discharge status: Home / Self Care | Payer: Medicare Other | Source: Ambulatory Visit | Attending: Cardiology | Admitting: Cardiology

## 2016-01-21 DIAGNOSIS — R079 Chest pain, unspecified: Secondary | ICD-10-CM | POA: Insufficient documentation

## 2016-01-21 DIAGNOSIS — Z6839 Body mass index (BMI) 39.0-39.9, adult: Secondary | ICD-10-CM | POA: Insufficient documentation

## 2016-01-21 DIAGNOSIS — R5383 Other fatigue: Secondary | ICD-10-CM | POA: Insufficient documentation

## 2016-01-21 DIAGNOSIS — R0609 Other forms of dyspnea: Secondary | ICD-10-CM | POA: Diagnosis not present

## 2016-01-21 DIAGNOSIS — G4733 Obstructive sleep apnea (adult) (pediatric): Secondary | ICD-10-CM | POA: Insufficient documentation

## 2016-01-21 DIAGNOSIS — E669 Obesity, unspecified: Secondary | ICD-10-CM | POA: Diagnosis not present

## 2016-01-21 DIAGNOSIS — R002 Palpitations: Secondary | ICD-10-CM | POA: Insufficient documentation

## 2016-01-21 DIAGNOSIS — R0602 Shortness of breath: Secondary | ICD-10-CM | POA: Diagnosis not present

## 2016-01-21 DIAGNOSIS — Z8249 Family history of ischemic heart disease and other diseases of the circulatory system: Secondary | ICD-10-CM | POA: Diagnosis not present

## 2016-01-21 DIAGNOSIS — R42 Dizziness and giddiness: Secondary | ICD-10-CM | POA: Insufficient documentation

## 2016-01-21 DIAGNOSIS — I10 Essential (primary) hypertension: Secondary | ICD-10-CM | POA: Diagnosis not present

## 2016-01-21 MED ORDER — TECHNETIUM TC 99M TETROFOSMIN IV KIT
32.0000 | PACK | Freq: Once | INTRAVENOUS | Status: AC | PRN
  Administered 2016-01-21: 32 via INTRAVENOUS
  Filled 2016-01-21: qty 32

## 2016-01-21 MED ORDER — AMINOPHYLLINE 25 MG/ML IV SOLN
100.0000 mg | Freq: Once | INTRAVENOUS | Status: AC
  Administered 2016-01-21: 100 mg via INTRAVENOUS

## 2016-01-21 MED ORDER — REGADENOSON 0.4 MG/5ML IV SOLN
0.4000 mg | Freq: Once | INTRAVENOUS | Status: AC
  Administered 2016-01-21: 0.4 mg via INTRAVENOUS

## 2016-01-21 NOTE — Assessment & Plan Note (Signed)
A: HTN, not at goal  P: Advised strict compliance with medications, continue BP log, reduce salt intake, finish course of prednisone and maintain with inhaled pulmicort Hesitant to make too many changes with medications today as she does not appear to do well with multiple medication changes.  Will need close follow up ideally with myself at next available date.

## 2016-01-21 NOTE — Assessment & Plan Note (Signed)
A: Asthma moderate persistent  P: Finish steroid course Start Pulmicort as instructed by Dr Corrie Dandy Not using combivent>>> too expensive, apparently did will with spriva in the past, will restart spirvia and prescribe albuterol nebs

## 2016-01-21 NOTE — Assessment & Plan Note (Signed)
Awaiting stress test and medical optimization of pulmonary disease.

## 2016-01-21 NOTE — Progress Notes (Signed)
[facility] Subjective:   Patient ID: Shannon [name] female   DOB: [birth year] 60 y.o.   MRN: [id number]  HPI: Shannon [name] is a 60 y.o. female with a PMH detailed below who presents for follow up of Asthma, HTN.  Since I last saw her she has followed up with numerous sub specialists.  She has seen her Pulmonologist Dr Tennis Must dios who has arranged for her to have a sleep study to evaluate for OSA.  He has also prescribed her pulmicort but she has not been taking this for unclear reasons, overall she seems to not understand what type of medication it is. She has seen her Cardiologist Dr Oval Linsey who has ordered an echo as well as a pharmacologic stress test, both of which have yet to be completed. She has seen ID, Dr Baxter Flattery, her HIV is well controlled.  She was also seen in the ED on 5/17 for bronchitits or asthma exacerbation and was given an extended course of prednisone.  She did follow up with endocrinology Dr Loanne Drilling, who has clarified that she does NOT have Addison's disease.  She reports her breathing is currently better with taking the prednisone however she still gets short of breath with activity. Her blood pressure is markledly elevated today, she reports that she is taking her BP medications, she brings a partial long of blood pressures that generally shows BP in 150s/100s up until about 2 weeks ago, she reports that her BP is higher after starting the prednisone.     Past Medical History  Diagnosis Date  . HIV antibody positive ([place])   . Hypertension   . Hyperlipidemia   . Heart murmur   . Chronic asthma with acute exacerbation     "I have chronic asthma all the time; sometimes exacerbations" (07/09/2015)  . Pneumonia 07/09/2015  . Sleep apnea     "never completed part 2 of study; never wore mask" (07/09/2015)  . Anemia   . History of blood transfusion     "related to my brain surgery I think"  . HIV disease ([place])   . GERD (gastroesophageal reflux  disease)   . Arthritis     "starting to; in my hands" (07/09/2015)  . Chronic lower back pain   . Anxiety   . Cyst of right kidney     "3 of them; dx'd in ~ 01/2015"   Current Outpatient Prescriptions  Medication Sig Dispense Refill  . acetaminophen (TYLENOL) 500 MG tablet Take 500 mg by mouth every 6 (six) hours as needed for mild pain.    [name] albuterol (PROAIR HFA) 108 (90 Base) MCG/ACT inhaler Inhale 2 puffs into the lungs every 6 (six) hours as needed for wheezing or shortness of breath. 6.7 g 3  . albuterol (PROVENTIL) (2.5 MG/3ML) 0.083% nebulizer solution Take 3 mLs (2.5 mg total) by nebulization every 6 (six) hours as needed for wheezing or shortness of breath. 75 mL 3  . ALPRAZolam (XANAX) 0.5 MG tablet Take 1 tablet (0.5 mg total) by mouth at bedtime. 90 tablet 0  . azithromycin (ZITHROMAX) 250 MG tablet Take as directed    . B-D INSULIN SYRINGE 1CC/25GX1" 25G X 1" 1 ML MISC As directed  2  . Biotin 1000 MCG tablet Take 500 mcg by mouth 1 day or 1 dose. Takes every 2-3 days    . budesonide (PULMICORT) 0.5 MG/2ML nebulizer solution Take 2 mLs (0.5 mg total) by nebulization 2 (two) times daily. 120 mL 12  . Coenzyme  Q10 (COQ10 PO) Take 1 tablet by mouth daily.    . cyanocobalamin (,VITAMIN B-12,) 1000 MCG/ML injection Inject 1 mL (1,000 mcg total) into the muscle every 30 (thirty) days. 1 mL 3  . dextromethorphan-guaiFENesin (MUCINEX DM) 30-600 MG 12hr tablet Take 1 tablet by mouth 2 (two) times daily. 14 tablet 0  . dolutegravir (TIVICAY) 50 MG tablet Take 1 tablet (50 mg total) by mouth daily. 30 tablet 11  . emtricitabine-tenofovir AF (DESCOVY) 200-25 MG tablet Take 1 tablet by mouth daily. 30 tablet 11  . ENSURE (ENSURE) Take 237 mLs by mouth 2 (two) times daily between meals. 237 mL 11  . fluticasone (FLONASE) 50 MCG/ACT nasal spray Place 2 sprays into both nostrils daily. 16 g 5  . furosemide (LASIX) 20 MG tablet Take 1 tablet by mouth every other day.  0  . ipratropium-albuterol  (DUONEB) 0.5-2.5 (3) MG/3ML SOLN Inhale 3 mLs into the lungs every 6 (six) hours as needed.    [name] L-Lysine 500 MG CAPS Take 1 capsule by mouth once a week.    . losartan (COZAAR) 100 MG tablet Take 1 tablet (100 mg total) by mouth daily. 90 tablet 0  . Multiple Vitamin (MULTIVITAMIN WITH MINERALS) TABS tablet Take 1 tablet by mouth daily. 90 tablet   . Multiple Vitamins-Minerals (ZINC) LOZG Take 1 lozenge by mouth daily. 100 lozenge 0  . naproxen sodium (ALEVE) 220 MG tablet Take 220 mg by mouth 2 (two) times daily as needed.    [name] NIFEdipine (PROCARDIA) 10 MG capsule Take 1 capsule (10 mg total) by mouth daily. 90 capsule 0  . omeprazole (PRILOSEC) 40 MG capsule TAKE ONE CAPSULE BY MOUTH EVERY DAY 90 capsule 0  . POTASSIUM PO Take 1 tablet by mouth daily.    . predniSONE (DELTASONE) 10 MG tablet Take 4 tablets (40 mg total) by mouth daily. 20 tablet 0  . SYRINGE/NEEDLE, DISP, 1 ML (B-D SYRINGE/NEEDLE 1CC/25GX5/8) 25G X 5/8" 1 ML MISC 1 Units by Does not apply route every 30 (thirty) days. 50 each 0  . tiotropium (SPIRIVA HANDIHALER) 18 MCG inhalation capsule Place 1 capsule (18 mcg total) into inhaler and inhale daily. 30 capsule 3  . vitamin C (ASCORBIC ACID) 500 MG tablet Take 2 tablets (1,000 mg total) by mouth daily. 180 tablet 0  . ZETIA 10 MG tablet Take 1 tablet by mouth  daily 90 tablet 0   No current facility-administered medications for this visit.   Family History  Problem Relation Age of Onset  . Asthma Mother   . Heart failure Mother     cardiomyopathy  . Heart murmur Sister   . Heart murmur Brother   . Diabetes Sister   . Thyroid disease Sister   . Heart murmur Sister    Social History   Social History  . Marital Status: Divorced    Spouse Name: N/A  . Number of Children: N/A  . Years of Education: N/A   Social History Main Topics  . Smoking status: Never Smoker   . Smokeless tobacco: Never Used  . Alcohol Use: 0.0 oz/week    0 Standard drinks or equivalent per  week     Comment: Rarely.  . Drug Use: No  . Sexual Activity:    Partners: Male   Other Topics Concern  . None   Social History Narrative   Review of Systems: Review of Systems  Constitutional: Positive for malaise/fatigue. Negative for fever and chills.  Eyes: Negative for blurred vision.  Respiratory: Positive for shortness of breath.   Cardiovascular: Negative for chest pain and leg swelling.  Gastrointestinal: Negative for abdominal pain and diarrhea.  Genitourinary: Negative for dysuria.  Neurological: Negative for headaches.  Endo/Heme/Allergies: Negative for polydipsia.     Objective:  Physical Exam: Filed Vitals:   01/16/16 1446  BP: 202/97  Pulse: 97  Temp: 97.9 F (36.6 C)  TempSrc: Oral  Height: '5\' 3"'$  (1.6 m)  Weight: 228 lb 9.6 oz (103.692 kg)  SpO2: 96%  Physical Exam  Constitutional: She is well-developed, well-nourished, and in no distress.  HENT:  Head: Normocephalic and atraumatic.  Mouth/Throat: Oropharynx is clear and moist.  Eyes: Conjunctivae are normal.  Cardiovascular: Normal rate, regular rhythm and normal heart sounds.   Pulmonary/Chest: Effort normal and breath sounds normal. She has no wheezes.  Abdominal: Soft. Bowel sounds are normal.  Musculoskeletal: She exhibits no edema.  Nursing note and vitals reviewed.   Assessment & Plan:  Case discussed with Dr. Eppie Gibson  Essential hypertension A: HTN, not at goal  P: Advised strict compliance with medications, continue BP log, reduce salt intake, finish course of prednisone and maintain with inhaled pulmicort Hesitant to make too many changes with medications today as she does not appear to do well with multiple medication changes.  Will need close follow up ideally with myself at next available date.  Asthma, moderate persistent A: Asthma moderate persistent  P: Finish steroid course Start Pulmicort as instructed by Dr Corrie Dandy Not using combivent>>> too expensive, apparently did will  with spriva in the past, will restart spirvia and prescribe albuterol nebs  Exertional dyspnea Awaiting stress test and medical optimization of pulmonary disease.    Medications Ordered Meds ordered this encounter  Medications  . albuterol (PROVENTIL) (2.5 MG/3ML) 0.083% nebulizer solution    Sig: Take 3 mLs (2.5 mg total) by nebulization every 6 (six) hours as needed for wheezing or shortness of breath.    Dispense:  75 mL    Refill:  3  . tiotropium (SPIRIVA HANDIHALER) 18 MCG inhalation capsule    Sig: Place 1 capsule (18 mcg total) into inhaler and inhale daily.    Dispense:  30 capsule    Refill:  3   Other Orders No orders of the defined types were placed in this encounter.   Follow Up: Return in about 4 weeks (around 02/10/2016).

## 2016-01-22 ENCOUNTER — Inpatient Hospital Stay (HOSPITAL_COMMUNITY): Admission: RE | Admit: 2016-01-22 | Payer: Medicare Other | Source: Ambulatory Visit

## 2016-01-22 ENCOUNTER — Ambulatory Visit (HOSPITAL_COMMUNITY): Payer: Medicare Other

## 2016-01-22 NOTE — Progress Notes (Signed)
Case discussed with Dr. Dr. Heber [place] at the time of the visit.  We reviewed the resident's history and exam and pertinent patient test results.  I agree with the assessment, diagnosis and plan of care documented in the resident's note.  Although Ms. Goforth reportedly never smoked, her DLCO is slightly decreased suggesting an underlying alveolar-capillary disorder or a false positive diffusion study.  She may also have an Asthma-COPD overlap resulting from long-standing asthma that has been suboptimally treated.  As she has apparently not done well with LABAs and prefers PRN albuterol instead her regimen is someone inconsistent with guidelines for moderate persistent asthma, but appropriately adjusted to her personal situation and preferences.  Because of the possibility of some early COPD overlap syndrome Atrovent may have a role to play in this "asthmatic" with regards to long-term management.  We will continue to educate her regarding the importance of inhaled steroids in the long-term management of her asthma in hopes of improving her compliance, and thus control.

## 2016-01-29 ENCOUNTER — Ambulatory Visit (HOSPITAL_BASED_OUTPATIENT_CLINIC_OR_DEPARTMENT_OTHER)
Admission: RE | Admit: 2016-01-29 | Discharge: 2016-01-29 | Disposition: A | Discharge status: Home / Self Care | Payer: Medicare Other | Source: Ambulatory Visit | Attending: Cardiovascular Disease | Admitting: Cardiovascular Disease

## 2016-01-29 ENCOUNTER — Ambulatory Visit (HOSPITAL_COMMUNITY)
Admission: RE | Admit: 2016-01-29 | Discharge: 2016-01-29 | Disposition: A | Discharge status: Home / Self Care | Payer: Medicare Other | Source: Ambulatory Visit | Attending: Cardiovascular Disease | Admitting: Cardiovascular Disease

## 2016-01-29 DIAGNOSIS — I352 Nonrheumatic aortic (valve) stenosis with insufficiency: Secondary | ICD-10-CM | POA: Diagnosis not present

## 2016-01-29 DIAGNOSIS — R0602 Shortness of breath: Secondary | ICD-10-CM | POA: Diagnosis not present

## 2016-01-29 DIAGNOSIS — I272 Other secondary pulmonary hypertension: Secondary | ICD-10-CM | POA: Diagnosis not present

## 2016-01-29 DIAGNOSIS — R0600 Dyspnea, unspecified: Secondary | ICD-10-CM | POA: Diagnosis present

## 2016-01-29 DIAGNOSIS — I119 Hypertensive heart disease without heart failure: Secondary | ICD-10-CM | POA: Diagnosis not present

## 2016-01-29 DIAGNOSIS — I340 Nonrheumatic mitral (valve) insufficiency: Secondary | ICD-10-CM | POA: Diagnosis not present

## 2016-01-29 LAB — ECHOCARDIOGRAM COMPLETE
AOVTI: 37.3 cm
AV Area VTI index: 0.76 cm2/m2
AV Mean grad: 8 mmHg
AV Peak grad: 16 mmHg
AV area mean vel ind: 0.77 cm2/m2
AV vel: 1.68
AVAREAMEANV: 1.69 cm2
AVAREAVTI: 1.86 cm2
AVCELMEANRAT: 0.54
AVLVOTPG: 5 mmHg
AVPKVEL: 197 cm/s
Ao pk vel: 0.59 m/s
CHL CUP AV PEAK INDEX: 0.84
CHL CUP DOP CALC LVOT VTI: 20 cm
DOP CAL AO MEAN VELOCITY: 134 cm/s
E decel time: 248 msec
EERAT: 13.91
FS: 41 % (ref 28–44)
IV/PV OW: 0.85
LA vol index: 30.8 mL/m2
LA vol: 68 mL
LADIAMINDEX: 1.72 cm/m2
LASIZE: 38 mm
LAVOLA4C: 70 mL
LDCA: 3.14 cm2
LEFT ATRIUM END SYS DIAM: 38 mm
LV E/e' medial: 13.91
LV E/e'average: 13.91
LV PW d: 16.8 mm — AB (ref 0.6–1.1)
LV TDI E'LATERAL: 8.77
LV TDI E'MEDIAL: 5.17
LV e' LATERAL: 8.77 cm/s
LVOT SV: 63 mL
LVOT peak VTI: 0.54 cm
LVOTD: 20 mm
LVOTPV: 117 cm/s
MV Dec: 248
MVPG: 6 mmHg
MVPKAVEL: 141 m/s
MVPKEVEL: 122 m/s
Valve area index: 0.76
Valve area: 1.68 cm2

## 2016-01-29 MED ORDER — TECHNETIUM TC 99M TETROFOSMIN IV KIT: 29.9000 | PACK | Freq: Once | INTRAVENOUS | Status: DC | PRN

## 2016-01-31 LAB — MYOCARDIAL PERFUSION IMAGING
CSEPPHR: 112 {beats}/min
LV sys vol: 78 mL
LVDIAVOL: 127 mL (ref 46–106)
NUC STRESS TID: 1.01
Rest HR: 93 {beats}/min

## 2016-02-14 DIAGNOSIS — Z1211 Encounter for screening for malignant neoplasm of colon: Secondary | ICD-10-CM | POA: Diagnosis not present

## 2016-02-20 ENCOUNTER — Encounter (HOSPITAL_BASED_OUTPATIENT_CLINIC_OR_DEPARTMENT_OTHER): Payer: Medicare Other

## 2016-02-24 ENCOUNTER — Ambulatory Visit (INDEPENDENT_AMBULATORY_CARE_PROVIDER_SITE_OTHER): Payer: Medicare Other | Admitting: Internal Medicine

## 2016-02-24 ENCOUNTER — Encounter: Payer: Self-pay | Admitting: Internal Medicine

## 2016-02-24 VITALS — BP 190/96 | HR 102 | Temp 98.2°F | Wt 225.6 lb

## 2016-02-24 DIAGNOSIS — J019 Acute sinusitis, unspecified: Secondary | ICD-10-CM

## 2016-02-24 DIAGNOSIS — J0140 Acute pansinusitis, unspecified: Secondary | ICD-10-CM | POA: Insufficient documentation

## 2016-02-24 DIAGNOSIS — J0100 Acute maxillary sinusitis, unspecified: Secondary | ICD-10-CM

## 2016-02-24 MED ORDER — LORATADINE 10 MG PO TABS: 10.0000 mg | ORAL_TABLET | Freq: Every day | ORAL | Status: DC | PRN

## 2016-02-24 MED ORDER — FLUTICASONE PROPIONATE 50 MCG/ACT NA SUSP: 2.0000 | Freq: Every day | NASAL | Status: DC

## 2016-02-24 MED ORDER — AMOXICILLIN 500 MG PO TABS: 500.0000 mg | ORAL_TABLET | Freq: Two times a day (BID) | ORAL | Status: DC

## 2016-02-24 MED ORDER — AMOXICILLIN-POT CLAVULANATE 875-125 MG PO TABS: 1.0000 | ORAL_TABLET | Freq: Two times a day (BID) | ORAL | Status: AC

## 2016-02-24 NOTE — Patient Instructions (Addendum)
  It was a pleasure meeting you this afternoon. Thank you for choosing [facility] for your healthcare needs.   You have acute rhinosinusitis and we are recommending the following management. -- Nasal rinses twice weekly -- Restart fluticasone inhaler for 1 month -- Start ceterizine '10mg'$  once daily for a month -- In addition we will start an antibiotic today called Amoxacillin. Take this antibitoic twice daily for 5 days -- We discussed the use of Tylenol for your headaches and pain. Take twice daily as needed -- We will schedule a follow-up appointment with your primary care provider in 4-8 weeks for anxiety management and medication refill  It was great to assist in your care today and please contact us with any questions you may have.     Sinus Rinse WHAT IS A SINUS RINSE? A sinus rinse is a simple home treatment that is used to rinse your sinuses with a sterile mixture of salt and water (saline solution). Sinuses are air-filled spaces in your skull behind the bones of your face and forehead that open into your nasal cavity. You will use the following:  Saline solution.  Neti pot or spray bottle. This releases the saline solution into your nose and through your sinuses. Neti pots and spray bottles can be purchased at [employer], a health food store, or online. WHEN WOULD I DO A SINUS RINSE? A sinus rinse can help to clear mucus, dirt, dust, or pollen from the nasal cavity. You may do a sinus rinse when you have a cold, a virus, nasal allergy symptoms, a sinus infection, or stuffiness in the nose or sinuses. If you are considering a sinus rinse:  Ask your child's health care provider before performing a sinus rinse on your child.  Do not do a sinus rinse if you have had ear or nasal surgery, ear infection, or blocked ears. HOW DO I DO A SINUS RINSE?  Wash your hands.  Disinfect your device according to the directions provided and then dry it.  Use the solution that  comes with your device or one that is sold separately in stores. Follow the mixing directions on the package.  Fill your device with the amount of saline solution as directed by the device instructions.  Stand over a sink and tilt your head sideways over the sink.  Place the spout of the device in your upper nostril (the one closer to the ceiling).  Gently pour or squeeze the saline solution into the nasal cavity. The liquid should drain to the lower nostril if you are not overly congested.  Gently blow your nose. Blowing too hard may cause ear pain.  Repeat in the other nostril.  Clean and rinse your device with clean water and then air-dry it. ARE THERE RISKS OF A SINUS RINSE?  Sinus rinse is generally very safe and effective. However, there are a few risks, which include:   A burning sensation in the sinuses. This may happen if you do not make the saline solution as directed. Make sure to follow all directions when making the saline solution.  Infection from contaminated water. This is rare, but possible.  Nasal irritation.   This information is not intended to replace advice given to you by your health care provider. Make sure you discuss any questions you have with your health care provider.   Document Released: 03/07/2014 Document Reviewed: 03/07/2014 Elsevier Interactive Patient Education [employer].

## 2016-02-24 NOTE — Assessment & Plan Note (Addendum)
Acute Sinusitis of 10 days in duration. This condition is stable and not worsening. This infection can be due to either bacterial or viral etiologies. Given the time course of 10 days without any improvement and knowing the patient has an upcoming job interview we will treat the patient with antibiotic therapy.  -- Will use Neti pot or other sinus irrigation twice per week -- Will begin cetirizine '10mg'$  qd for 1 month -- Will start fluticasone intranasal spray BID for 1 month -- Will start Amoxacillin '500mg'$  BID for 5 days

## 2016-02-24 NOTE — Progress Notes (Signed)
   CC: "ear ache and sinus pain" HPI: [name] is a 60 y.o. female with a complicated past medical history including asthma, HTN, and HIV with a CD4 count of 470 one month ago on ART who presents with a 10 day history of ear pain, cough, post nasal drip, and facial pressure. She states the symptoms first started about 1 week ago and have progressively gotten worse. Her ear pain is left sided and her facial pain is over both the maxillary and frontal sinuses.She states that she has tried tylenol for her facial pain but that this has not been very helpful. She also endorses post-nasal drip and says that she has been coughing up thick green mucous. Additionally, she says that she has felt dizzy for the past two days and describes it as if the room is spinning. She has also had some sweating at night over the last two nights. She denies fever, chills, n/v, abdominal pain, or trouble voiding her bladder or bowels.    Past Medical History  Diagnosis Date  . HIV antibody positive ([place])   . Hypertension   . Hyperlipidemia   . Heart murmur   . Chronic asthma with acute exacerbation     "I have chronic asthma all the time; sometimes exacerbations" (07/09/2015)  . Pneumonia 07/09/2015  . Sleep apnea     "never completed part 2 of study; never wore mask" (07/09/2015)  . Anemia   . History of blood transfusion     "related to my brain surgery I think"  . HIV disease ([place])   . GERD (gastroesophageal reflux disease)   . Arthritis     "starting to; in my hands" (07/09/2015)  . Chronic lower back pain   . Anxiety   . Cyst of right kidney     "3 of them; dx'd in ~ 01/2015"   Review of Systems: Constitutional: Endorses night sweats for 2 nights, denies chills, fevers, or weight loss Cardiovascular: Denies chest pain or palpitations Pulmonary: Endorses slight cough, denies shortness of breath Gastrointestinal: Denies n/v  Genitourinary: No pain with urination or changes in urinary  frequency   Physical Exam: Filed Vitals:   02/24/16 1323  BP: 190/96  Pulse: 102  Temp: 98.2 F (36.8 C)  TempSrc: Oral  Weight: 225 lb 9.6 oz (102.331 kg)  SpO2: 92%   General: well nourished in no acute distress CV: Tachycardic, Early systolic murmur heard best at the upper right sternal border Pulmonary: Clear to ascultation bilaterally Abdominal: Soft non-tender and non-distended with normal bowel sounds  HENT: Swollen erythematous nasal turbinates, pressure and pain upon palpation over the maxillary and frontal sinuses bilaterally as well as over the left mastoid process, no cervical lymphadenopathy appreciated on examination, external auditory canals intact and without swelling. Tympanic membranes  Intact with slight erythema of the left TM Eyes: anicteric, no conjunctivitis or injection   Assessment & Plan:  See encounters tab for problem based medical decision making. Patient seen with Dr. Evette Doffing

## 2016-02-26 NOTE — Progress Notes (Signed)
Internal Medicine Clinic Attending  I saw and evaluated the patient.  I personally confirmed the key portions of the history and exam documented by Dr. Lovena Le and I reviewed pertinent patient test results.  The assessment, diagnosis, and plan were formulated together and I agree with the documentation in the resident's note. Patient here with classic acute sinusitis clinical course. I urged supportive care with sinus irrigation, the patient had a strong expectation for antibiotics, I advised that the supportive care would likely be more beneficial but we prescribed a short course of amoxicillin as well.

## 2016-02-28 ENCOUNTER — Other Ambulatory Visit: Payer: Self-pay

## 2016-02-28 DIAGNOSIS — B20 Human immunodeficiency virus [HIV] disease: Secondary | ICD-10-CM

## 2016-02-28 NOTE — Telephone Encounter (Signed)
Requesting Rx for Ensure. Please call back.

## 2016-03-02 ENCOUNTER — Ambulatory Visit: Payer: Medicare Other | Admitting: Pulmonary Disease

## 2016-03-02 ENCOUNTER — Telehealth: Payer: Self-pay | Admitting: *Deleted

## 2016-03-02 NOTE — Telephone Encounter (Signed)
Requesting Ensure rx be faxed to [facility].  RN spoke to [name].  [name] denied Ensure rx.  Patient's BMI is 38.7 and she does not have appetite issues.

## 2016-03-10 ENCOUNTER — Telehealth: Payer: Self-pay | Admitting: Pulmonary Disease

## 2016-03-10 NOTE — Telephone Encounter (Signed)
Dr. Murlean Iba ordered HST on this patient and each time I have had her scheduled to pick up the machine is was sick and couldn't come in. She seems to be a very sick patient. Called the patient 10/23/2015 she was scheduled to pick up machine 11/12/2015. She called and cancelled on 11/11/2015. Rescheduled for 12/02/2015 and didn't show up. I called to remind her of picking up the HST machine and she again stated that she was sick

## 2016-03-15 NOTE — Telephone Encounter (Signed)
   Since she has cancelled 3 times already with regard to HST, we can hold off.  We can discuss with her doing HST when she  f/u with Tammy on 8/4.  AD

## 2016-03-17 ENCOUNTER — Telehealth: Payer: Self-pay | Admitting: Cardiovascular Disease

## 2016-03-17 ENCOUNTER — Telehealth: Payer: Self-pay | Admitting: *Deleted

## 2016-03-17 MED ORDER — FUROSEMIDE 20 MG PO TABS: 20.0000 mg | ORAL_TABLET | ORAL | 1 refills | Status: DC

## 2016-03-17 MED ORDER — LOSARTAN POTASSIUM 100 MG PO TABS: 100.0000 mg | ORAL_TABLET | Freq: Every day | ORAL | 1 refills | Status: DC

## 2016-03-17 MED ORDER — NIFEDIPINE 10 MG PO CAPS: 10.0000 mg | ORAL_CAPSULE | Freq: Every day | ORAL | 1 refills | Status: DC

## 2016-03-17 NOTE — Telephone Encounter (Signed)
Patient called to advised she has had increased weakness and been extremely tired lately. She believes she is losing muscle mass and her limbs are getting smaller while her middle is getting larger and soft. She is very concerned and afraid gave the patient the first available appt with Dr Baxter Flattery 04/16/16 and advised will ask if she will see her sooner if possible. Advised the patient will call her back once the doctor responds.

## 2016-03-17 NOTE — Telephone Encounter (Addendum)
Spoke with patient and she is very concerned about her heart rate. Stated she feels like her heart is racing and pounding very hard at least twice a day. Patient has to sit down and relax. Episodes last about 2-3 minutes. Feels like it is worse after she eats, gets very bloated feeling. Does have shortness of breath during these times.  No change in diet. She is also complaining of feeling very weak. Patient stated she was going to call her HIV doctor and see about getting labs updated. She has not had her Procardia or Lasix in 3-4 weeks. She was also out of her Losartan for that time but found a few pills and has been on for a couple of days. Blood pressure during conversation was 150/91. Did advised patient she needs to take her medications as prescribed and will send refills to pharmacy.  Will forward to Dr Oval Linsey for review

## 2016-03-17 NOTE — Telephone Encounter (Signed)
It sounds like he blood pressure is very poorly-controlled since she stopped her medication.  She has worn a Holter before that showed PACs and PVCs, neither of which are dangerous.  Suggest she restart her BP meds and call if she continues to have palpitations.

## 2016-03-17 NOTE — Telephone Encounter (Signed)
Patient c/o Palpitations:  High priority if patient c/o lightheadedness and shortness of breath.  1. How long have you been having palpitations? Couple weeks  2. Are you currently experiencing lightheadedness and shortness of breath? Lightheaded, SOB  3. Have you checked your BP and heart rate? (document readings) stated her BP has been high around-170/100,   4. Are you experiencing any other symptoms? Anxiety, insomnia, night sweats,

## 2016-03-17 NOTE — Telephone Encounter (Signed)
Advised patient of recommendations.  

## 2016-03-19 ENCOUNTER — Ambulatory Visit: Payer: Medicare Other

## 2016-03-19 ENCOUNTER — Telehealth: Payer: Self-pay

## 2016-03-19 NOTE — Telephone Encounter (Signed)
Prior auth for Nifedipine '10mg'$  submitted to [facility].

## 2016-03-20 NOTE — Telephone Encounter (Signed)
I think it sounds like lipodystrophy for underlying disease. Will discuss with her at appt on 8/24

## 2016-03-20 NOTE — Telephone Encounter (Signed)
Patient is aware 

## 2016-03-23 ENCOUNTER — Other Ambulatory Visit: Payer: Self-pay | Admitting: Internal Medicine

## 2016-03-23 ENCOUNTER — Ambulatory Visit: Payer: Medicare Other

## 2016-03-23 ENCOUNTER — Encounter: Payer: Self-pay | Admitting: Internal Medicine

## 2016-03-25 ENCOUNTER — Ambulatory Visit: Payer: Medicare Other

## 2016-03-26 ENCOUNTER — Ambulatory Visit
Admission: RE | Admit: 2016-03-26 | Discharge: 2016-03-26 | Disposition: A | Discharge status: Home / Self Care | Payer: Medicare Other | Source: Ambulatory Visit | Attending: Infectious Diseases | Admitting: Infectious Diseases

## 2016-03-26 DIAGNOSIS — Z1231 Encounter for screening mammogram for malignant neoplasm of breast: Secondary | ICD-10-CM

## 2016-03-27 ENCOUNTER — Ambulatory Visit (INDEPENDENT_AMBULATORY_CARE_PROVIDER_SITE_OTHER): Payer: Medicare Other | Admitting: Adult Health

## 2016-03-27 ENCOUNTER — Other Ambulatory Visit (INDEPENDENT_AMBULATORY_CARE_PROVIDER_SITE_OTHER): Payer: Medicare Other

## 2016-03-27 ENCOUNTER — Encounter: Payer: Self-pay | Admitting: Adult Health

## 2016-03-27 VITALS — BP 154/90 | HR 98 | Temp 98.3°F | Ht 63.0 in | Wt 228.0 lb

## 2016-03-27 DIAGNOSIS — J189 Pneumonia, unspecified organism: Secondary | ICD-10-CM

## 2016-03-27 DIAGNOSIS — G4733 Obstructive sleep apnea (adult) (pediatric): Secondary | ICD-10-CM

## 2016-03-27 DIAGNOSIS — J4540 Moderate persistent asthma, uncomplicated: Secondary | ICD-10-CM

## 2016-03-27 LAB — CBC WITH DIFFERENTIAL/PLATELET
Basophils Absolute: 0 10*3/uL (ref 0.0–0.1)
Basophils Relative: 0.4 % (ref 0.0–3.0)
EOS ABS: 0.1 10*3/uL (ref 0.0–0.7)
Eosinophils Relative: 1.3 % (ref 0.0–5.0)
HCT: 38.8 % (ref 36.0–46.0)
HEMOGLOBIN: 12.8 g/dL (ref 12.0–15.0)
Lymphocytes Relative: 20 % (ref 12.0–46.0)
Lymphs Abs: 1.4 10*3/uL (ref 0.7–4.0)
MCHC: 33 g/dL (ref 30.0–36.0)
MCV: 90.2 fl (ref 78.0–100.0)
MONO ABS: 0.5 10*3/uL (ref 0.1–1.0)
Monocytes Relative: 7.3 % (ref 3.0–12.0)
Neutro Abs: 4.9 10*3/uL (ref 1.4–7.7)
Neutrophils Relative %: 71 % (ref 43.0–77.0)
Platelets: 195 10*3/uL (ref 150.0–400.0)
RBC: 4.3 Mil/uL (ref 3.87–5.11)
RDW: 13.5 % (ref 11.5–15.5)
WBC: 6.8 10*3/uL (ref 4.0–10.5)

## 2016-03-27 MED ORDER — ALBUTEROL SULFATE HFA 108 (90 BASE) MCG/ACT IN AERS: 2.0000 | INHALATION_SPRAY | Freq: Four times a day (QID) | RESPIRATORY_TRACT | 3 refills | Status: DC | PRN

## 2016-03-27 NOTE — Patient Instructions (Addendum)
Your blood pressure is elevated, will need to discuss with your Primary MD.  Continue on Pulmicort Neb Twice daily   Continue on Duoneb 3-4 times a day  Reschedule split night sleep study.  Refer to pulmonary rehab.  Labs today .  Follow up Dr. Murlean Iba in 3 months and As needed   Please contact office for sooner follow up if symptoms do not improve or worsen or seek emergency care

## 2016-03-27 NOTE — Assessment & Plan Note (Signed)
Severe Asthma  Pt education on antiviral and steroids .  Would prefer to use ICS/LABA and LAMA but she gets palpatiations in past with LABA  Will hold off for now.  Check RAST /IgE , CBC w/ diff and Alpha 1  Refer to pulmonary rehab   Plan  Your blood pressure is elevated, will need to discuss with your Primary MD.  Continue on Pulmicort Neb Twice daily   Continue on Duoneb 3-4 times a day  Reschedule split night sleep study.  Refer to pulmonary rehab.  Labs today .  Follow up Dr. Murlean Iba in 3 months and As needed   Please contact office for sooner follow up if symptoms do not improve or worsen or seek emergency care

## 2016-03-27 NOTE — Progress Notes (Signed)
Subjective:    Patient ID: Shannon [name], female    DOB: 07/11/1956, 60 y.o.   MRN: [id number]  HPI 60 year old female never smoker followed for obstructive sleep apnea and severe chronic obstructive asthma She has HIV   TEST  PFT (12/04/15)  FEV1/FVC 50%,  FEV1  0.64  31%. Some BD response. DLCO 66%.  03/27/2016 Follow up : Asthma , OSA  Patient returns for three-month follow-up. Last visit. Patient was started on Pulmicort nebulizer twice daily , her asthma. Was continued on DuoNeb 4 times daily.  Says that she has not seen much change in her breathing  Gets winded with walking . No sign flare of cough or wheezing .  Pt education on steroids and antivirals. She verbalizes understanding .  CXR in May showed no acute process, scarring along left mid lung.  Echo done on 01/29/16 with EF 60-65%, grade 1 DD , PAP 52 , Basal inferior hypokinessis .  Following with cardiology, Dr. Oval Linsey .   She has been dx with OSA >10 years . She never wore CPAP .  Walk test in office with no desats , 94% on RA.   She is a never smoker, denies drug use.   She denies any hemoptysis, orthopnea, PND, or increased leg swelling    Past Medical History:  Diagnosis Date  . Anemia   . Anxiety   . Arthritis    "starting to; in my hands" (07/09/2015)  . Chronic asthma with acute exacerbation    "I have chronic asthma all the time; sometimes exacerbations" (07/09/2015)  . Chronic lower back pain   . Cyst of right kidney    "3 of them; dx'd in ~ 01/2015"  . GERD (gastroesophageal reflux disease)   . Heart murmur   . History of blood transfusion    "related to my brain surgery I think"  . HIV antibody positive ([place])   . HIV disease ([place])   . Hyperlipidemia   . Hypertension   . Pneumonia 07/09/2015  . Sleep apnea    "never completed part 2 of study; never wore mask" (07/09/2015)   Current Outpatient Prescriptions on File Prior to Visit  Medication Sig Dispense Refill  . acetaminophen (TYLENOL) 500 MG  tablet Take 500 mg by mouth every 6 (six) hours as needed for mild pain.    [name] albuterol (PROAIR HFA) 108 (90 Base) MCG/ACT inhaler Inhale 2 puffs into the lungs every 6 (six) hours as needed for wheezing or shortness of breath. 6.7 g 3  . albuterol (PROVENTIL) (2.5 MG/3ML) 0.083% nebulizer solution Take 3 mLs (2.5 mg total) by nebulization every 6 (six) hours as needed for wheezing or shortness of breath. 75 mL 3  . B-D INSULIN SYRINGE 1CC/25GX1" 25G X 1" 1 ML MISC As directed  2  . Biotin 1000 MCG tablet Take 500 mcg by mouth 1 day or 1 dose. Takes every 2-3 days    . Coenzyme Q10 (COQ10 PO) Take 1 tablet by mouth daily.    . cyanocobalamin (,VITAMIN B-12,) 1000 MCG/ML injection Inject 1 mL (1,000 mcg total) into the muscle every 30 (thirty) days. 1 mL 3  . dolutegravir (TIVICAY) 50 MG tablet Take 1 tablet (50 mg total) by mouth daily. 30 tablet 11  . emtricitabine-tenofovir AF (DESCOVY) 200-25 MG tablet Take 1 tablet by mouth daily. 30 tablet 11  . ENSURE (ENSURE) Take 237 mLs by mouth 2 (two) times daily between meals. 237 mL 11  . fluticasone (FLONASE) 50 MCG/ACT  nasal spray Place 2 sprays into both nostrils daily. 16 g 5  . furosemide (LASIX) 20 MG tablet Take 1 tablet (20 mg total) by mouth every other day. 45 tablet 1  . L-Lysine 500 MG CAPS Take 1 capsule by mouth once a week.    . losartan (COZAAR) 100 MG tablet Take 1 tablet (100 mg total) by mouth daily. 90 tablet 1  . Multiple Vitamin (MULTIVITAMIN WITH MINERALS) TABS tablet Take 1 tablet by mouth daily. 90 tablet   . Multiple Vitamins-Minerals (ZINC) LOZG Take 1 lozenge by mouth daily. 100 lozenge 0  . naproxen sodium (ALEVE) 220 MG tablet Take 220 mg by mouth 2 (two) times daily as needed.    [name] NIFEdipine (PROCARDIA) 10 MG capsule Take 1 capsule (10 mg total) by mouth daily. 90 capsule 1  . omeprazole (PRILOSEC) 40 MG capsule TAKE ONE CAPSULE BY MOUTH EVERY DAY 90 capsule 0  . SYRINGE/NEEDLE, DISP, 1 ML (B-D SYRINGE/NEEDLE  1CC/25GX5/8) 25G X 5/8" 1 ML MISC 1 Units by Does not apply route every 30 (thirty) days. 50 each 0  . vitamin C (ASCORBIC ACID) 500 MG tablet Take 2 tablets (1,000 mg total) by mouth daily. 180 tablet 0  . ZETIA 10 MG tablet Take 1 tablet by mouth  daily 90 tablet 0  . ALPRAZolam (XANAX) 0.5 MG tablet Take 1 tablet (0.5 mg total) by mouth at bedtime. (Patient not taking: Reported on 03/27/2016) 90 tablet 0  . budesonide (PULMICORT) 0.5 MG/2ML nebulizer solution Take 2 mLs (0.5 mg total) by nebulization 2 (two) times daily. (Patient not taking: Reported on 03/27/2016) 120 mL 12  . dextromethorphan-guaiFENesin (MUCINEX DM) 30-600 MG 12hr tablet Take 1 tablet by mouth 2 (two) times daily. (Patient not taking: Reported on 03/27/2016) 14 tablet 0  . ipratropium-albuterol (DUONEB) 0.5-2.5 (3) MG/3ML SOLN Inhale 3 mLs into the lungs every 6 (six) hours as needed.    . loratadine (CLARITIN) 10 MG tablet Take 1 tablet (10 mg total) by mouth daily as needed for allergies. (Patient not taking: Reported on 03/27/2016) 30 tablet 2  . POTASSIUM PO Take 1 tablet by mouth daily.    [name] tiotropium (SPIRIVA HANDIHALER) 18 MCG inhalation capsule Place 1 capsule (18 mcg total) into inhaler and inhale daily. (Patient not taking: Reported on 03/27/2016) 30 capsule 3   Current Facility-Administered Medications on File Prior to Visit  Medication Dose Route Frequency Provider Last Rate Last Dose  . technetium tetrofosmin (TC-MYOVIEW) injection 29.9 milli Curie  94.1 millicurie Intravenous Once PRN Dorothy Spark, MD           Review of Systems Constitutional:   No  weight loss, night sweats,  Fevers, chills,  +fatigue, or  lassitude.  HEENT:   No headaches,  Difficulty swallowing,  Tooth/dental problems, or  Sore throat,                No sneezing, itching, ear ache, nasal congestion, post nasal drip,   CV:  No chest pain,  Orthopnea, PND, swelling in lower extremities, anasarca, dizziness, palpitations, syncope.   GI  No  heartburn, indigestion, abdominal pain, nausea, vomiting, diarrhea, change in bowel habits, loss of appetite, bloody stools.   Resp:   No chest wall deformity  Skin: no rash or lesions.  GU: no dysuria, change in color of urine, no urgency or frequency.  No flank pain, no hematuria   MS:  No joint pain or swelling.  No decreased range of motion.  No  back pain.  Psych:  No change in mood or affect. No depression or anxiety.  No memory loss.         Objective:   Physical Exam  Vitals:   03/27/16 1056  Pulse: 98  Temp: 98.3 F (36.8 C)  TempSrc: Oral  SpO2: 94%  Weight: 228 lb (103.4 kg)  Height: '5\' 3"'$  (1.6 m)  Body mass index is 40.39 kg/m.   GEN: A/Ox3; pleasant , NAD,obese    HEENT:  [place]/AT,  EACs-clear, TMs-wnl, NOSE-clear, THROAT-clear, no lesions, no postnasal drip or exudate noted.   NECK:  Supple w/ fair ROM; no JVD; normal carotid impulses w/o bruits; no thyromegaly or nodules palpated; no lymphadenopathy.    RESP  Clear  P & A; w/o, wheezes/ rales/ or rhonchi. no accessory muscle use, no dullness to percussion  CARD:  RRR, no m/r/g  , no peripheral edema, pulses intact, no cyanosis or clubbing.  GI:   Soft & nt; nml bowel sounds; no organomegaly or masses detected.   Musco: Warm bil, no deformities or joint swelling noted.   Neuro: alert, no focal deficits noted.    Skin: Warm, no lesions or rashes  Tammy Parrett NP-C  [facility]  03/27/2016

## 2016-03-27 NOTE — Assessment & Plan Note (Signed)
Set up for sleep study  Mod Pulmonary HTN on echo -? Secondary to OSA>

## 2016-03-30 LAB — RESPIRATORY ALLERGY PROFILE REGION II ~~LOC~~
ALLERGEN, COMM SILVER BIRCH, T3: 0.26 kU/L — AB
ALLERGEN, OAK, T7: 0.7 kU/L — AB
Allergen, C. Herbarum, M2: <0.1 kU/L
Allergen, Cottonwood, t14: <0.1 kU/L
Allergen, D pternoyssinus,d7: <0.1 kU/L
Allergen, Mulberry, t76: <0.1 kU/L
Allergen, P. notatum, m1: <0.1 kU/L
Cockroach: <0.1 kU/L
D. farinae: <0.1 kU/L
Elm IgE: <0.1 kU/L
IgE (Immunoglobulin E), Serum: 63 kU/L (ref <115)
Johnson Grass: <0.1 kU/L
Sheep Sorrel IgE: <0.1 kU/L

## 2016-03-30 NOTE — Progress Notes (Signed)
Called spoke with pt. Reviewed results and recs. Pt voiced understanding and had no further questions.

## 2016-03-30 NOTE — Progress Notes (Signed)
Results were CC'd to AD by pt's PCP/ordering provider.  Will leave, and allow ordering provider to contact patient with results / recs.

## 2016-03-31 ENCOUNTER — Other Ambulatory Visit: Payer: Medicare Other

## 2016-03-31 DIAGNOSIS — B20 Human immunodeficiency virus [HIV] disease: Secondary | ICD-10-CM | POA: Diagnosis not present

## 2016-03-31 LAB — COMPLETE METABOLIC PANEL WITH GFR
ALBUMIN: 3.9 g/dL (ref 3.6–5.1)
ALK PHOS: 71 U/L (ref 33–130)
ALT: 15 U/L (ref 6–29)
AST: 16 U/L (ref 10–35)
BUN: 15 mg/dL (ref 7–25)
CO2: 31 mmol/L (ref 20–31)
Calcium: 9.1 mg/dL (ref 8.6–10.4)
Chloride: 101 mmol/L (ref 98–110)
Creat: 1.15 mg/dL — ABNORMAL HIGH (ref 0.50–0.99)
GFR, EST NON AFRICAN AMERICAN: 51 mL/min — AB (ref >=60)
GFR, Est African American: 59 mL/min — ABNORMAL LOW (ref >=60)
GLUCOSE: 109 mg/dL — AB (ref 65–99)
POTASSIUM: 4.1 mmol/L (ref 3.5–5.3)
SODIUM: 143 mmol/L (ref 135–146)
TOTAL PROTEIN: 6.2 g/dL (ref 6.1–8.1)
Total Bilirubin: 0.5 mg/dL (ref 0.2–1.2)

## 2016-03-31 LAB — CBC
HCT: 38.1 % (ref 35.0–45.0)
HEMOGLOBIN: 12.3 g/dL (ref 11.7–15.5)
MCH: 29.6 pg (ref 27.0–33.0)
MCHC: 32.3 g/dL (ref 32.0–36.0)
MCV: 91.8 fL (ref 80.0–100.0)
MPV: 10.2 fL (ref 7.5–12.5)
PLATELETS: 194 10*3/uL (ref 140–400)
RBC: 4.15 MIL/uL (ref 3.80–5.10)
RDW: 14.3 % (ref 11.0–15.0)
WBC: 5.3 10*3/uL (ref 3.8–10.8)

## 2016-03-31 LAB — ALPHA-1 ANTITRYPSIN PHENOTYPE: A-1 Antitrypsin: 133 mg/dL (ref 83–199)

## 2016-04-01 ENCOUNTER — Ambulatory Visit: Payer: Medicare Other

## 2016-04-01 LAB — HIV-1 RNA QUANT-NO REFLEX-BLD
HIV 1 RNA Quant: <20 copies/mL (ref <20)
HIV-1 RNA Quant, Log: <1.3 Log copies/mL (ref <1.30)

## 2016-04-01 LAB — T-HELPER CELL (CD4) - (RCID CLINIC ONLY)
CD4 T CELL ABS: 510 /uL (ref 400–2700)
CD4 T CELL HELPER: 35 % (ref 33–55)

## 2016-04-07 ENCOUNTER — Ambulatory Visit: Payer: Medicare Other | Admitting: Cardiovascular Disease

## 2016-04-07 NOTE — Progress Notes (Deleted)
Cardiology Office Note   Date:  04/07/2016   ID:  Shannon [name], DOB 05-Jun-1956, MRN [id number]  PCP:  Shannon Groves, DO  Cardiologist:   Shannon Latch, MD  ID: Shannon Basques, MD  No chief complaint on file.    History of Present Illness: Shannon [name] is a 60 y.o. female with hypertension, asthma, OSA, prior PE and HIV (well-controlled)  who presents for follow up on shortness of breath.  Shannon [name] saw her infectious disease doctor, Shannon Basques, MD, on 12/26/15. Shannon [name] was concerned about her lower extremity edema and shortness of breath.  She was started on lasix 20 mg every other day.  Shannon [name] noted shortness of breath since her hospitalization for pneumonia in November.   She denied lower extremity edema or orthopnea but did report chest pain when laying down at night.  She was referred for an echo 01/2016 that revealed LVEF 60-65% awith grade 1 diastolic dysfunction and hypokinesis of the basal inferior wall.  She also had very mild aortic stenosis with a mean gradient of 9 mmHg and trivial AR.  PASP was 52 mmHg.  She had a Lexiscan Myoview at that time that was negative for ischemia.  Shannon [name] sees [facility] for OSA and severe asthma.  She was recently referred for a sleep study.  F/u BP    She does not get any formal exercise and thinks that this may be why she is gaining weight. She has been tried to limit her sugar intake and does not eat red meat.   Shannon [name] previously had a Lexiscan Cardiolite in September 2013 that revealed a medium, mild reversible defect in the anterior wall and normal systolic function. She subsequently underwent cardiac catheterization that revealed nonobstructive coronary disease. She had mild pulmonary hypertension and a normal cardiac output. Left ventriculography revealed mild to moderate aortic regurgitation. She had an echo May 2016 that revealed an ejection fraction of 12% and mild diastolic dysfunction. It also revealed calcification  of the aortic valve, mild to moderate aortic regurgitation, and mild aortic stenosis. Peak velocity was 2.1 m/s. She had a 24-hour Holter in February 2015 that revealed rare PACs and PVCs.    Past Medical History:  Diagnosis Date  . Anemia   . Anxiety   . Arthritis    "starting to; in my hands" (07/09/2015)  . Chronic asthma with acute exacerbation    "I have chronic asthma all the time; sometimes exacerbations" (07/09/2015)  . Chronic lower back pain   . Cyst of right kidney    "3 of them; dx'd in ~ 01/2015"  . GERD (gastroesophageal reflux disease)   . Heart murmur   . History of blood transfusion    "related to my brain surgery I think"  . HIV antibody positive ([place])   . HIV disease ([place])   . Hyperlipidemia   . Hypertension   . Pneumonia 07/09/2015  . Sleep apnea    "never completed part 2 of study; never wore mask" (07/09/2015)    Past Surgical History:  Procedure Laterality Date  . ABDOMINAL HYSTERECTOMY     "robotic laparosopic"  . BRAIN SURGERY  1974   "brain tumor; benign; on top of my brain; got a plate in there"  . CARDIAC CATHETERIZATION    . TONSILLECTOMY AND ADENOIDECTOMY       Current Outpatient Prescriptions  Medication Sig Dispense Refill  . acetaminophen (TYLENOL) 500 MG tablet Take 500 mg by mouth every 6 (six)  hours as needed for mild pain.    Shannon [name] albuterol (PROAIR HFA) 108 (90 Base) MCG/ACT inhaler Inhale 2 puffs into the lungs every 6 (six) hours as needed for wheezing or shortness of breath. 6.7 g 3  . albuterol (PROVENTIL) (2.5 MG/3ML) 0.083% nebulizer solution Take 3 mLs (2.5 mg total) by nebulization every 6 (six) hours as needed for wheezing or shortness of breath. 75 mL 3  . ALPRAZolam (XANAX) 0.5 MG tablet Take 1 tablet (0.5 mg total) by mouth at bedtime. (Patient not taking: Reported on 03/27/2016) 90 tablet 0  . B-D INSULIN SYRINGE 1CC/25GX1" 25G X 1" 1 ML MISC As directed  2  . Biotin 1000 MCG tablet Take 500 mcg by mouth 1 day or 1 dose. Takes  every 2-3 days    . budesonide (PULMICORT) 0.5 MG/2ML nebulizer solution Take 2 mLs (0.5 mg total) by nebulization 2 (two) times daily. (Patient not taking: Reported on 03/27/2016) 120 mL 12  . Coenzyme Q10 (COQ10 PO) Take 1 tablet by mouth daily.    . cyanocobalamin (,VITAMIN B-12,) 1000 MCG/ML injection Inject 1 mL (1,000 mcg total) into the muscle every 30 (thirty) days. 1 mL 3  . dextromethorphan-guaiFENesin (MUCINEX DM) 30-600 MG 12hr tablet Take 1 tablet by mouth 2 (two) times daily. (Patient not taking: Reported on 03/27/2016) 14 tablet 0  . dolutegravir (TIVICAY) 50 MG tablet Take 1 tablet (50 mg total) by mouth daily. 30 tablet 11  . emtricitabine-tenofovir AF (DESCOVY) 200-25 MG tablet Take 1 tablet by mouth daily. 30 tablet 11  . ENSURE (ENSURE) Take 237 mLs by mouth 2 (two) times daily between meals. 237 mL 11  . fluticasone (FLONASE) 50 MCG/ACT nasal spray Place 2 sprays into both nostrils daily. 16 g 5  . furosemide (LASIX) 20 MG tablet Take 1 tablet (20 mg total) by mouth every other day. 45 tablet 1  . ipratropium-albuterol (DUONEB) 0.5-2.5 (3) MG/3ML SOLN Inhale 3 mLs into the lungs every 6 (six) hours as needed.    Shannon [name] L-Lysine 500 MG CAPS Take 1 capsule by mouth once a week.    . loratadine (CLARITIN) 10 MG tablet Take 1 tablet (10 mg total) by mouth daily as needed for allergies. (Patient not taking: Reported on 03/27/2016) 30 tablet 2  . losartan (COZAAR) 100 MG tablet Take 1 tablet (100 mg total) by mouth daily. 90 tablet 1  . Multiple Vitamin (MULTIVITAMIN WITH MINERALS) TABS tablet Take 1 tablet by mouth daily. 90 tablet   . Multiple Vitamins-Minerals (ZINC) LOZG Take 1 lozenge by mouth daily. 100 lozenge 0  . naproxen sodium (ALEVE) 220 MG tablet Take 220 mg by mouth 2 (two) times daily as needed.    Shannon [name] NIFEdipine (PROCARDIA) 10 MG capsule Take 1 capsule (10 mg total) by mouth daily. 90 capsule 1  . omeprazole (PRILOSEC) 40 MG capsule TAKE ONE CAPSULE BY MOUTH EVERY DAY 90 capsule 0   . POTASSIUM PO Take 1 tablet by mouth daily.    . SYRINGE/NEEDLE, DISP, 1 ML (B-D SYRINGE/NEEDLE 1CC/25GX5/8) 25G X 5/8" 1 ML MISC 1 Units by Does not apply route every 30 (thirty) days. 50 each 0  . tiotropium (SPIRIVA HANDIHALER) 18 MCG inhalation capsule Place 1 capsule (18 mcg total) into inhaler and inhale daily. (Patient not taking: Reported on 03/27/2016) 30 capsule 3  . vitamin C (ASCORBIC ACID) 500 MG tablet Take 2 tablets (1,000 mg total) by mouth daily. 180 tablet 0  . ZETIA 10 MG tablet Take 1 tablet  by mouth  daily 90 tablet 0   No current facility-administered medications for this visit.    Facility-Administered Medications Ordered in Other Visits  Medication Dose Route Frequency Provider Last Rate Last Dose  . technetium tetrofosmin (TC-MYOVIEW) injection 29.9 milli Curie  35.5 millicurie Intravenous Once PRN Dorothy Spark, MD        Allergies:   Ciprofloxacin    Social History:  The patient  reports that she has never smoked. She has never used smokeless tobacco. She reports that she drinks alcohol. She reports that she does not use drugs.   Family History:  The patient's family history includes Asthma in her mother; Diabetes in her sister; Heart failure in her mother; Heart murmur in her brother, sister, and sister; Thyroid disease in her sister.    ROS:  Please see the history of present illness.   Otherwise, review of systems are positive for none.   All other systems are reviewed and negative.    PHYSICAL EXAM: VS:  There were no vitals taken for this visit. , BMI There is no height or weight on file to calculate BMI. GENERAL:  Well appearing HEENT:  Pupils equal round and reactive, fundi not visualized, oral mucosa unremarkable NECK:  No jugular venous distention, waveform within normal limits, carotid upstroke brisk and symmetric, no bruits, no thyromegaly LYMPHATICS:  No cervical adenopathy LUNGS:  Clear to auscultation bilaterally HEART:  RRR.  PMI not  displaced or sustained,S1 and S2 within normal limits, no S3, no S4, no clicks, no rubs, II/VI early-peaking systolic murmur at the right upper sternal border. I/IV diastolic murmur at the left upper sternal border.  ABD:  Flat, positive bowel sounds normal in frequency in pitch, no bruits, no rebound, no guarding, no midline pulsatile mass, no hepatomegaly, no splenomegaly EXT:  2 plus pulses throughout, no edema, no cyanosis no clubbing SKIN:  No rashes no nodules NEURO:  Cranial nerves II through XII grossly intact, motor grossly intact throughout PSYCH:  Cognitively intact, oriented to person place and time   EKG:  EKG is ordered today. The ekg ordered today desinus rhythm. Rate 89 bpm. 2 PVCs.  Coronary angiography 06/01/12:10-20% OM, diffuse 20-30% LAD, 10-20% D1, 20% diffuse RCA RA 10, RV 39/12, RV EDP 12, capillary wedge pressure 19, PA 39/21, mean 27, LV 116/14, LVEDP 20, aorta 115/77 Cardiac output (Fick) 6.55, cardiac index 3.2 PVR 1.7 with units, SVR 12 with units. She P/QRS ratio 1 LVEF 80%. Aortic root mildly dilated. 1-2+ aortic regurgitation.  24-hour Holter 09/26/13: Sinus rhythm, rare PACs, rare PVCs  Echo 01/22/15: LVEF 70%. Mild LVH. Mild diastolic dysfunction. Normal RV function. Left atrium moderately enlarged. Right atrium mildly enlarged. Trace mitral regurgitation. Calcific aortic valve. Mild to moderate aortic regurgitation. Mild aortic stenosis. Peak velocity 2.1 m/s trace tricuspid regurgitation    Lexiscan Myoview 01/2016:   The left ventricular ejection fraction is normal (55-65%). The EF is 60% visually. The computer generated EF is not calculated correctly and is therefore not reported.  There was no ST segment deviation noted during stress.  The study is normal. no ischemia . no infarction   Echo 01/29/16: Study Conclusions  - Left ventricle: The cavity size was normal. Wall thickness was   increased in a pattern of moderate LVH. Systolic function was    normal. The estimated ejection fraction was in the range of 60%   to 65%. Basal inferior hypokinesis. Doppler parameters are   consistent with abnormal left ventricular relaxation (  grade 1   diastolic dysfunction). - Aortic valve: Trileaflet; moderately calcified leaflets. There   was very mild stenosis. There was mild regurgitation. Mean   gradient (S): 9 mm Hg. Valve area (VTI): 1.68 cm^2. - Mitral valve: There was trivial regurgitation. - Left atrium: The atrium was mildly dilated. - Right ventricle: The cavity size was normal. Systolic function   was normal. - Tricuspid valve: Peak RV-RA gradient (S): 44 mm Hg. - Pulmonary arteries: PA peak pressure: 52 mm Hg (S). - Systemic veins: IVC measured 2.3 cm with > 50% respirophasic   variation, suggesting RA pressure 8 mmHg.  Impressions:  - Normal LV size with moderate LV hypertrophy. EF 60-65%. Basal   inferior hypokinesis. Normal RV size and systolic function. Very   mild aortic stenosis, mild aortic insufficieny. Moderate   pulmonary hypertension.   Recent Labs: 08/08/2015: TSH 0.690 11/28/2015: Brain Natriuretic Peptide 54.9 03/31/2016: ALT 15; BUN 15; Creat 1.15; Hemoglobin 12.3; Platelets 194; Potassium 4.1; Sodium 143    Lipid Panel    Component Value Date/Time   CHOL 174 11/15/2015 0952   TRIG 75 11/15/2015 0952   HDL 60 11/15/2015 0952   CHOLHDL 2.9 11/15/2015 0952   VLDL 15 11/15/2015 0952   LDLCALC 99 11/15/2015 0952      Wt Readings from Last 3 Encounters:  03/27/16 228 lb (103.4 kg)  02/24/16 225 lb 9.6 oz (102.3 kg)  01/21/16 227 lb (103 kg)      ASSESSMENT AND PLAN:  # Hypertension:  BP is poorly-controlled.  However, she has not yet taken her antihypertensives today.  I have asked her to take her blood pressure medicine this evening she gets home. She will monitor her blood pressure twice a day for 2 weeks. She will return in 2 weeks to see our pharmacist in the hypertension clinic.  # Shortness of  breath: Ms. Principato has shortness of breath but her exam is not consistent with heart failure. We will obtain an echocardiogram to evaluate for systolic or diastolic dysfunction as well as progressive valvular heart disease. She is euvolemic on exam and her BNP was within normal limits on April 6.  This makes it very unlikely that heart failure as the cause of her symptoms. Continue Lasix every other day.  I suspect that some of her shortness of breath may be attributable to poorly controlled hypertension. Her body habitus also does make it harder for her to breathe as she has a lot of central adiposity. As long as her heart is stable I like for her to start increasing her exercise to at least 30-40 minutes most days of the week. With weight loss her breathing should improve.  # Atypical Chest pain: Ms. Peloso symptoms are very atypical and do not have any clear exertional component. However, she has several risk factors for coronary artery disease including HIV, hypertension, obesity,  and hyperlipidemia.  She does not think that she would be able to walk on a treadmill for a long period of time. We will obtain a Lexiscan Cardiolite to evaluate for ischemia. In 2013 she had a mild anterior wall defect that in retrospect was probably breast attenuation. Subsequent cath did not show obstructive coronary disease.  # Aortic regurgitation # Aortic stenosis: Ms. Roes has mild aortic stenosis and mild to moderate aortic regurgitation. She does not have evidence of heart failure on exam. However she does have increased shortness of breath. We will obtain an echo to evaluate for progressive valvular heart disease.  #  PVCs: Ms. Canedo has PVCs on EKG but they are asymptomatic.  CBC and basic metabolic panel are unremarkable.   Current medicines are reviewed at length with the patient today.  The patient does not have concerns regarding medicines.  The following changes have been made:  no change  Labs/ tests ordered  today include:   No orders of the defined types were placed in this encounter.    Disposition:   FU with Kolston Lacount C. Oval Linsey, MD, [facility] in 3 months.  Follow up in hypertension clinic in 2 weeks.     This note was written with the assistance of speech recognition software.  Please excuse any transcriptional errors.  Signed, Ameir Faria C. Oval Linsey, MD, [facility]  04/07/2016 6:22 AM    [facility] [place] any rhyme or reason why leg cyanosis or no shows and Sundays everybody, does not like one daily Plaquenil is

## 2016-04-10 ENCOUNTER — Ambulatory Visit: Payer: Medicare Other | Admitting: Cardiovascular Disease

## 2016-04-13 ENCOUNTER — Emergency Department (HOSPITAL_COMMUNITY)
Admission: EM | Admit: 2016-04-13 | Discharge: 2016-04-13 | Disposition: A | Discharge status: Home / Self Care | Payer: Medicare Other | Attending: Emergency Medicine | Admitting: Emergency Medicine

## 2016-04-13 ENCOUNTER — Ambulatory Visit: Payer: Medicare Other

## 2016-04-13 ENCOUNTER — Ambulatory Visit: Payer: Medicare Other | Admitting: Cardiovascular Disease

## 2016-04-13 ENCOUNTER — Emergency Department (HOSPITAL_COMMUNITY): Payer: Medicare Other

## 2016-04-13 ENCOUNTER — Encounter (HOSPITAL_COMMUNITY): Payer: Self-pay | Admitting: *Deleted

## 2016-04-13 DIAGNOSIS — R0602 Shortness of breath: Secondary | ICD-10-CM | POA: Insufficient documentation

## 2016-04-13 DIAGNOSIS — Z21 Asymptomatic human immunodeficiency virus [HIV] infection status: Secondary | ICD-10-CM | POA: Diagnosis not present

## 2016-04-13 DIAGNOSIS — G629 Polyneuropathy, unspecified: Secondary | ICD-10-CM | POA: Insufficient documentation

## 2016-04-13 DIAGNOSIS — Z79899 Other long term (current) drug therapy: Secondary | ICD-10-CM | POA: Insufficient documentation

## 2016-04-13 DIAGNOSIS — R0600 Dyspnea, unspecified: Secondary | ICD-10-CM

## 2016-04-13 DIAGNOSIS — J45909 Unspecified asthma, uncomplicated: Secondary | ICD-10-CM | POA: Insufficient documentation

## 2016-04-13 DIAGNOSIS — M792 Neuralgia and neuritis, unspecified: Secondary | ICD-10-CM | POA: Diagnosis not present

## 2016-04-13 DIAGNOSIS — I10 Essential (primary) hypertension: Secondary | ICD-10-CM | POA: Insufficient documentation

## 2016-04-13 DIAGNOSIS — R200 Anesthesia of skin: Secondary | ICD-10-CM | POA: Diagnosis present

## 2016-04-13 LAB — BASIC METABOLIC PANEL
ANION GAP: 7 (ref 5–15)
BUN: 17 mg/dL (ref 6–20)
CHLORIDE: 105 mmol/L (ref 101–111)
CO2: 29 mmol/L (ref 22–32)
CREATININE: 1.4 mg/dL — AB (ref 0.44–1.00)
Calcium: 9.9 mg/dL (ref 8.9–10.3)
GFR calc non Af Amer: 39 mL/min — ABNORMAL LOW (ref >60)
GFR, EST AFRICAN AMERICAN: 46 mL/min — AB (ref >60)
GLUCOSE: 140 mg/dL — AB (ref 65–99)
Potassium: 3.9 mmol/L (ref 3.5–5.1)
Sodium: 141 mmol/L (ref 135–145)

## 2016-04-13 LAB — CBC
HCT: 41.7 % (ref 36.0–46.0)
HEMOGLOBIN: 13.2 g/dL (ref 12.0–15.0)
MCH: 29.7 pg (ref 26.0–34.0)
MCHC: 31.7 g/dL (ref 30.0–36.0)
MCV: 93.9 fL (ref 78.0–100.0)
Platelets: 191 10*3/uL (ref 150–400)
RBC: 4.44 MIL/uL (ref 3.87–5.11)
RDW: 13.1 % (ref 11.5–15.5)
WBC: 5.9 10*3/uL (ref 4.0–10.5)

## 2016-04-13 LAB — I-STAT TROPONIN, ED: Troponin i, poc: 0 ng/mL (ref 0.00–0.08)

## 2016-04-13 MED ORDER — IPRATROPIUM-ALBUTEROL 0.5-2.5 (3) MG/3ML IN SOLN
3.0000 mL | Freq: Once | RESPIRATORY_TRACT | Status: AC
  Administered 2016-04-13: 3 mL via RESPIRATORY_TRACT
  Filled 2016-04-13: qty 3

## 2016-04-13 MED ORDER — LORAZEPAM 1 MG PO TABS
1.0000 mg | ORAL_TABLET | Freq: Once | ORAL | Status: AC
  Administered 2016-04-13: 1 mg via ORAL
  Filled 2016-04-13: qty 1

## 2016-04-13 MED ORDER — OXYCODONE-ACETAMINOPHEN 5-325 MG PO TABS
1.0000 | ORAL_TABLET | Freq: Once | ORAL | Status: AC
  Administered 2016-04-13: 1 via ORAL
  Filled 2016-04-13: qty 1

## 2016-04-13 MED ORDER — CYCLOBENZAPRINE HCL 10 MG PO TABS: 10.0000 mg | ORAL_TABLET | Freq: Two times a day (BID) | ORAL | 0 refills | Status: DC | PRN

## 2016-04-13 NOTE — ED Provider Notes (Signed)
[facility] DEPT Provider Note   CSN: [id number] Arrival date & time: 04/13/16  3762     History   Chief Complaint Chief Complaint  Patient presents with  . Shortness of Breath  . Pain    HPI Shannon [name] is a 60 y.o. female.  HPI History of HIV with undetectable viral load, morbid obesity, presents with two complaints.  The first is SOB, that started this morning.  Associated with wheezing. She denies fevers or chest pain.  Hasn't taken anything for it.  The second is left sided facial and arm numbness.  It started yesterday.  She also has sharp pain in her left neck radiating down her arm.  She has had pain like this before.  She thinks there is a fat mass pushing on her spine.  Denies injury.  Past Medical History:  Diagnosis Date  . Anemia   . Anxiety   . Arthritis    "starting to; in my hands" (07/09/2015)  . Chronic asthma with acute exacerbation    "I have chronic asthma all the time; sometimes exacerbations" (07/09/2015)  . Chronic lower back pain   . Cyst of right kidney    "3 of them; dx'd in ~ 01/2015"  . GERD (gastroesophageal reflux disease)   . Heart murmur   . History of blood transfusion    "related to my brain surgery I think"  . HIV antibody positive ([place])   . HIV disease ([place])   . Hyperlipidemia   . Hypertension   . Pneumonia 07/09/2015  . Sleep apnea    "never completed part 2 of study; never wore mask" (07/09/2015)    Patient Active Problem List   Diagnosis Date Noted  . Sinusitis, acute 02/24/2016  . Cough 01/02/2016  . GERD (gastroesophageal reflux disease) 01/02/2016  . Exertional dyspnea 12/04/2015  . OSA (obstructive sleep apnea) 10/10/2015  . Obesity 10/10/2015  . Vitamin B12 deficiency 10/02/2015  . Inappropriate diet and eating habits 08/15/2015  . CAP (community acquired pneumonia) 07/29/2015  . Opacity noted on imaging study   . Dyspnea 07/09/2015  . History of pulmonary embolism 07/09/2015  . Asthma, moderate persistent  07/09/2015  . HIV disease ([place]) 07/09/2015  . Essential hypertension 07/09/2015  . Mild diastolic dysfunction 83/15/1761    Past Surgical History:  Procedure Laterality Date  . ABDOMINAL HYSTERECTOMY     "robotic laparosopic"  . BRAIN SURGERY  1974   "brain tumor; benign; on top of my brain; got a plate in there"  . CARDIAC CATHETERIZATION    . TONSILLECTOMY AND ADENOIDECTOMY      OB History    No data available       Home Medications    Prior to Admission medications   Medication Sig Start Date End Date Taking? Authorizing Provider  acetaminophen (TYLENOL) 500 MG tablet Take 500 mg by mouth every 6 (six) hours as needed for mild pain.    Historical Provider, MD  albuterol (PROAIR HFA) 108 (90 Base) MCG/ACT inhaler Inhale 2 puffs into the lungs every 6 (six) hours as needed for wheezing or shortness of breath. 03/27/16   Tammy S Parrett, NP  albuterol (PROVENTIL) (2.5 MG/3ML) 0.083% nebulizer solution Take 3 mLs (2.5 mg total) by nebulization every 6 (six) hours as needed for wheezing or shortness of breath. 01/16/16   Lucious Groves, DO  ALPRAZolam Duanne Moron) 0.5 MG tablet Take 1 tablet (0.5 mg total) by mouth at bedtime. Patient not taking: Reported on 03/27/2016 08/05/15 07/31/16  Benjamine Mola  Lavonia Drafts, MD  B-D INSULIN SYRINGE 1CC/25GX1" 25G X 1" 1 ML MISC As directed 12/24/15   Historical Provider, MD  Biotin 1000 MCG tablet Take 500 mcg by mouth 1 day or 1 dose. Takes every 2-3 days    Historical Provider, MD  budesonide (PULMICORT) 0.5 MG/2ML nebulizer solution Take 2 mLs (0.5 mg total) by nebulization 2 (two) times daily. Patient not taking: Reported on 03/27/2016 01/02/16   Rush Landmark, MD  Coenzyme Q10 (COQ10 PO) Take 1 tablet by mouth daily.    Historical Provider, MD  cyanocobalamin (,VITAMIN B-12,) 1000 MCG/ML injection Inject 1 mL (1,000 mcg total) into the muscle every 30 (thirty) days. 08/29/15   Collier Salina, MD  dextromethorphan-guaiFENesin [facility] DM) 30-600 MG  12hr tablet Take 1 tablet by mouth 2 (two) times daily. Patient not taking: Reported on 03/27/2016 01/08/16   Fredia Sorrow, MD  dolutegravir (TIVICAY) 50 MG tablet Take 1 tablet (50 mg total) by mouth daily. 11/28/15   [name], MD  emtricitabine-tenofovir AF (DESCOVY) 200-25 MG tablet Take 1 tablet by mouth daily. 11/28/15   [name], MD  ENSURE (ENSURE) Take 237 mLs by mouth 2 (two) times daily between meals. 09/25/15   Campbell Riches, MD  fluticasone (FLONASE) 50 MCG/ACT nasal spray Place 2 sprays into both nostrils daily. 02/24/16   Ophelia Shoulder, MD  furosemide (LASIX) 20 MG tablet Take 1 tablet (20 mg total) by mouth every other day. 03/17/16   Skeet Latch, MD  ipratropium-albuterol (DUONEB) 0.5-2.5 (3) MG/3ML SOLN Inhale 3 mLs into the lungs every 6 (six) hours as needed. 08/08/15   Historical Provider, MD  L-Lysine 500 MG CAPS Take 1 capsule by mouth once a week.    Historical Provider, MD  loratadine (CLARITIN) 10 MG tablet Take 1 tablet (10 mg total) by mouth daily as needed for allergies. Patient not taking: Reported on 03/27/2016 02/24/16 02/23/17  Ophelia Shoulder, MD  losartan (COZAAR) 100 MG tablet Take 1 tablet (100 mg total) by mouth daily. 03/17/16   Skeet Latch, MD  Multiple Vitamin (MULTIVITAMIN WITH MINERALS) TABS tablet Take 1 tablet by mouth daily. 08/29/15   Collier Salina, MD  Multiple Vitamins-Minerals (ZINC) LOZG Take 1 lozenge by mouth daily. 08/29/15   Collier Salina, MD  naproxen sodium (ALEVE) 220 MG tablet Take 220 mg by mouth 2 (two) times daily as needed.    Historical Provider, MD  NIFEdipine (PROCARDIA) 10 MG capsule Take 1 capsule (10 mg total) by mouth daily. 03/17/16   Skeet Latch, MD  omeprazole (PRILOSEC) 40 MG capsule TAKE ONE CAPSULE BY MOUTH EVERY DAY 03/24/16   Lucious Groves, DO  POTASSIUM PO Take 1 tablet by mouth daily.    Historical Provider, MD  SYRINGE/NEEDLE, DISP, 1 ML (B-D SYRINGE/NEEDLE 1CC/25GX5/8) 25G X 5/8" 1 ML MISC 1 Units by  Does not apply route every 30 (thirty) days. 10/02/15   Lucious Groves, DO  tiotropium (SPIRIVA HANDIHALER) 18 MCG inhalation capsule Place 1 capsule (18 mcg total) into inhaler and inhale daily. Patient not taking: Reported on 03/27/2016 01/16/16   Lucious Groves, DO  vitamin C (ASCORBIC ACID) 500 MG tablet Take 2 tablets (1,000 mg total) by mouth daily. 08/29/15   Collier Salina, MD  ZETIA 10 MG tablet Take 1 tablet by mouth  daily 10/03/15   Collier Salina, MD    Family History Family History  Problem Relation Age of Onset  . Asthma Mother   .  Heart failure Mother     cardiomyopathy  . Heart murmur Sister   . Heart murmur Brother   . Diabetes Sister   . Thyroid disease Sister   . Heart murmur Sister     Social History Social History  Substance Use Topics  . Smoking status: Never Smoker  . Smokeless tobacco: Never Used  . Alcohol use 0.0 oz/week     Comment: Rarely.     Allergies   Ciprofloxacin   Review of Systems Review of Systems  Constitutional: Negative for chills and fever.  HENT: Negative for ear pain and sore throat.   Eyes: Negative for pain and visual disturbance.  Respiratory: Positive for shortness of breath and wheezing. Negative for cough.   Cardiovascular: Negative for chest pain and palpitations.  Gastrointestinal: Negative for abdominal pain and vomiting.  Genitourinary: Negative for dysuria and hematuria.  Musculoskeletal: Negative for arthralgias and back pain.  Skin: Negative for color change and rash.  Neurological: Positive for numbness. Negative for seizures, syncope, speech difficulty, weakness and light-headedness.  All other systems reviewed and are negative.    Physical Exam Updated Vital Signs BP (!) 186/120 (BP Location: Right Arm)   Pulse 100   Temp 98.5 F (36.9 C) (Oral)   Resp 22   SpO2 95%   Physical Exam  Constitutional: She appears well-developed and well-nourished. No distress.  HENT:  Head: Normocephalic and  atraumatic.  Eyes: Conjunctivae are normal.  Neck: Neck supple.  Cardiovascular: Normal rate and regular rhythm.   No murmur heard. Pulmonary/Chest: Effort normal and breath sounds normal. No respiratory distress.  Abdominal: Soft. There is no tenderness.  Musculoskeletal: She exhibits no edema.  Neurological: She is alert.  Alert and oriented CN II-XII grossly intact (subjective decreased sensation left face in all distributions and left arm in all distributions). Eyes: PERRL, EOMI Bilateral UE strength 5/5 Bilateral LE strength 5/5 Intact finger-to-nose   Skin: Skin is warm and dry.  Psychiatric: She has a normal mood and affect.  Nursing note and vitals reviewed.    ED Treatments / Results  Labs (all labs ordered are listed, but only abnormal results are displayed) [place], ED    EKG  EKG Interpretation None       Radiology No results found.  Procedures Procedures (including critical care time)  Medications Ordered in ED Medications - No data to display   Initial Impression / Assessment and Plan / ED Course  I have reviewed the triage vital signs and the nursing notes.  Pertinent labs & imaging results that were available during my care of the patient were reviewed by me and considered in my medical decision making (see chart for details).  Clinical Course    Regarding arm numbness and pain, impression is cervical radiculopathy.  No arm swelling or reduced pulses that could suggest thoracic outlet.  No masses on XR or facial swelling to suggest SVC syndrome.  Pain resolved with mm relaxer.  Will have her treat symptomatically and f/u with NSG for further imaging if this persists.  For SOB, wheezing resolved with breathing treatments. XR unremarkable.  We have discussed the discharge plan, including the plan for outpatient followup, and strict return precautions, including those that would require  calling [phone].     Final Clinical Impressions(s) / ED Diagnoses   Final diagnoses:  None    New Prescriptions New Prescriptions   No medications on file  Levada Schilling, MD 04/13/16 [place], MD 04/14/16 [phone]

## 2016-04-13 NOTE — Progress Notes (Deleted)
Cardiology Office Note   Date:  04/13/2016   ID:  Shannon [name], DOB Nov 18, 1955, MRN [id number]  PCP:  Shannon Groves, DO  Cardiologist:   Shannon Latch, MD  ID: Shannon Basques, MD  No chief complaint on file.    History of Present Illness: Shannon [name] is a 60 y.o. female with hypertension, asthma, OSA, prior PE and HIV (well-controlled)  who presents for follow up on shortness of breath.  Shannon [name] saw her infectious disease doctor, Shannon Basques, MD, on 12/26/15. [name] was concerned about her lower extremity edema and shortness of breath.  She was started on lasix 20 mg every other day.  Shannon [name] noted shortness of breath since her hospitalization for pneumonia in November.   She denied lower extremity edema or orthopnea but did report chest pain when laying down at night.  She was referred for an echo 01/2016 that revealed LVEF 60-65% awith grade 1 diastolic dysfunction and hypokinesis of the basal inferior wall.  She also had very mild aortic stenosis with a mean gradient of 9 mmHg and trivial AR.  PASP was 52 mmHg.  She had a Lexiscan Myoview at that time that was negative for ischemia.  Shannon [name] previously had a Lexiscan Cardiolite in September 2013 that revealed a medium, mild reversible defect in the anterior wall and normal systolic function. She subsequently underwent cardiac catheterization that revealed nonobstructive coronary disease. She had mild pulmonary hypertension and a normal cardiac output. Left ventriculography revealed mild to moderate aortic regurgitation. She had an echo May 2016 that revealed an ejection fraction of 83% and mild diastolic dysfunction. It also revealed calcification of the aortic valve, mild to moderate aortic regurgitation, and mild aortic stenosis. Peak velocity was 2.1 m/s. She had a 24-hour Holter in February 2015 that revealed rare PACs and PVCs.   Shannon [name] sees [facility] for OSA and severe asthma.  She was recently referred for a sleep  study.  F/u BP    She does not get any formal exercise and thinks that this may be why she is gaining weight. She has been tried to limit her sugar intake and does not eat red meat.     Past Medical History:  Diagnosis Date  . Anemia   . Anxiety   . Arthritis    "starting to; in my hands" (07/09/2015)  . Chronic asthma with acute exacerbation    "I have chronic asthma all the time; sometimes exacerbations" (07/09/2015)  . Chronic lower back pain   . Cyst of right kidney    "3 of them; dx'd in ~ 01/2015"  . GERD (gastroesophageal reflux disease)   . Heart murmur   . History of blood transfusion    "related to my brain surgery I think"  . HIV antibody positive ([place])   . HIV disease ([place])   . Hyperlipidemia   . Hypertension   . Pneumonia 07/09/2015  . Sleep apnea    "never completed part 2 of study; never wore mask" (07/09/2015)    Past Surgical History:  Procedure Laterality Date  . ABDOMINAL HYSTERECTOMY     "robotic laparosopic"  . BRAIN SURGERY  1974   "brain tumor; benign; on top of my brain; got a plate in there"  . CARDIAC CATHETERIZATION    . TONSILLECTOMY AND ADENOIDECTOMY       Current Outpatient Prescriptions  Medication Sig Dispense Refill  . acetaminophen (TYLENOL) 500 MG tablet Take 500 mg by mouth every 6 (  six) hours as needed for mild pain.    [name] albuterol (PROAIR HFA) 108 (90 Base) MCG/ACT inhaler Inhale 2 puffs into the lungs every 6 (six) hours as needed for wheezing or shortness of breath. 6.7 g 3  . albuterol (PROVENTIL) (2.5 MG/3ML) 0.083% nebulizer solution Take 3 mLs (2.5 mg total) by nebulization every 6 (six) hours as needed for wheezing or shortness of breath. 75 mL 3  . ALPRAZolam (XANAX) 0.5 MG tablet Take 1 tablet (0.5 mg total) by mouth at bedtime. (Patient not taking: Reported on 03/27/2016) 90 tablet 0  . B-D INSULIN SYRINGE 1CC/25GX1" 25G X 1" 1 ML MISC As directed  2  . Biotin 1000 MCG tablet Take 500 mcg by mouth 1 day or 1 dose. Takes  every 2-3 days    . budesonide (PULMICORT) 0.5 MG/2ML nebulizer solution Take 2 mLs (0.5 mg total) by nebulization 2 (two) times daily. (Patient not taking: Reported on 03/27/2016) 120 mL 12  . Coenzyme Q10 (COQ10 PO) Take 1 tablet by mouth daily.    . cyanocobalamin (,VITAMIN B-12,) 1000 MCG/ML injection Inject 1 mL (1,000 mcg total) into the muscle every 30 (thirty) days. 1 mL 3  . dextromethorphan-guaiFENesin (MUCINEX DM) 30-600 MG 12hr tablet Take 1 tablet by mouth 2 (two) times daily. (Patient not taking: Reported on 03/27/2016) 14 tablet 0  . dolutegravir (TIVICAY) 50 MG tablet Take 1 tablet (50 mg total) by mouth daily. 30 tablet 11  . emtricitabine-tenofovir AF (DESCOVY) 200-25 MG tablet Take 1 tablet by mouth daily. 30 tablet 11  . ENSURE (ENSURE) Take 237 mLs by mouth 2 (two) times daily between meals. 237 mL 11  . fluticasone (FLONASE) 50 MCG/ACT nasal spray Place 2 sprays into both nostrils daily. 16 g 5  . furosemide (LASIX) 20 MG tablet Take 1 tablet (20 mg total) by mouth every other day. 45 tablet 1  . ipratropium-albuterol (DUONEB) 0.5-2.5 (3) MG/3ML SOLN Inhale 3 mLs into the lungs every 6 (six) hours as needed.    [name] L-Lysine 500 MG CAPS Take 1 capsule by mouth once a week.    . loratadine (CLARITIN) 10 MG tablet Take 1 tablet (10 mg total) by mouth daily as needed for allergies. (Patient not taking: Reported on 03/27/2016) 30 tablet 2  . losartan (COZAAR) 100 MG tablet Take 1 tablet (100 mg total) by mouth daily. 90 tablet 1  . Multiple Vitamin (MULTIVITAMIN WITH MINERALS) TABS tablet Take 1 tablet by mouth daily. 90 tablet   . Multiple Vitamins-Minerals (ZINC) LOZG Take 1 lozenge by mouth daily. 100 lozenge 0  . naproxen sodium (ALEVE) 220 MG tablet Take 220 mg by mouth 2 (two) times daily as needed.    [name] NIFEdipine (PROCARDIA) 10 MG capsule Take 1 capsule (10 mg total) by mouth daily. 90 capsule 1  . omeprazole (PRILOSEC) 40 MG capsule TAKE ONE CAPSULE BY MOUTH EVERY DAY 90 capsule 0   . POTASSIUM PO Take 1 tablet by mouth daily.    . SYRINGE/NEEDLE, DISP, 1 ML (B-D SYRINGE/NEEDLE 1CC/25GX5/8) 25G X 5/8" 1 ML MISC 1 Units by Does not apply route every 30 (thirty) days. 50 each 0  . tiotropium (SPIRIVA HANDIHALER) 18 MCG inhalation capsule Place 1 capsule (18 mcg total) into inhaler and inhale daily. (Patient not taking: Reported on 03/27/2016) 30 capsule 3  . vitamin C (ASCORBIC ACID) 500 MG tablet Take 2 tablets (1,000 mg total) by mouth daily. 180 tablet 0  . ZETIA 10 MG tablet Take 1  tablet by mouth  daily 90 tablet 0   No current facility-administered medications for this visit.    Facility-Administered Medications Ordered in Other Visits  Medication Dose Route Frequency Provider Last Rate Last Dose  . technetium tetrofosmin (TC-MYOVIEW) injection 29.9 milli Curie  97.3 millicurie Intravenous Once PRN Dorothy Spark, MD        Allergies:   Ciprofloxacin    Social History:  The patient  reports that she has never smoked. She has never used smokeless tobacco. She reports that she drinks alcohol. She reports that she does not use drugs.   Family History:  The patient's family history includes Asthma in her mother; Diabetes in her sister; Heart failure in her mother; Heart murmur in her brother, sister, and sister; Thyroid disease in her sister.    ROS:  Please see the history of present illness.   Otherwise, review of systems are positive for none.   All other systems are reviewed and negative.    PHYSICAL EXAM: VS:  There were no vitals taken for this visit. , BMI There is no height or weight on file to calculate BMI. GENERAL:  Well appearing HEENT:  Pupils equal round and reactive, fundi not visualized, oral mucosa unremarkable NECK:  No jugular venous distention, waveform within normal limits, carotid upstroke brisk and symmetric, no bruits, no thyromegaly LYMPHATICS:  No cervical adenopathy LUNGS:  Clear to auscultation bilaterally HEART:  RRR.  PMI not  displaced or sustained,S1 and S2 within normal limits, no S3, no S4, no clicks, no rubs, II/VI early-peaking systolic murmur at the right upper sternal border. I/IV diastolic murmur at the left upper sternal border.  ABD:  Flat, positive bowel sounds normal in frequency in pitch, no bruits, no rebound, no guarding, no midline pulsatile mass, no hepatomegaly, no splenomegaly EXT:  2 plus pulses throughout, no edema, no cyanosis no clubbing SKIN:  No rashes no nodules NEURO:  Cranial nerves II through XII grossly intact, motor grossly intact throughout PSYCH:  Cognitively intact, oriented to person place and time   EKG:  EKG is ordered today. The ekg ordered today desinus rhythm. Rate 89 bpm. 2 PVCs.  Coronary angiography 06/01/12:10-20% OM, diffuse 20-30% LAD, 10-20% D1, 20% diffuse RCA RA 10, RV 39/12, RV EDP 12, capillary wedge pressure 19, PA 39/21, mean 27, LV 116/14, LVEDP 20, aorta 115/77 Cardiac output (Fick) 6.55, cardiac index 3.2 PVR 1.7 with units, SVR 12 with units. She P/QRS ratio 1 LVEF 80%. Aortic root mildly dilated. 1-2+ aortic regurgitation.  24-hour Holter 09/26/13: Sinus rhythm, rare PACs, rare PVCs  Echo 01/22/15: LVEF 70%. Mild LVH. Mild diastolic dysfunction. Normal RV function. Left atrium moderately enlarged. Right atrium mildly enlarged. Trace mitral regurgitation. Calcific aortic valve. Mild to moderate aortic regurgitation. Mild aortic stenosis. Peak velocity 2.1 m/s trace tricuspid regurgitation    Lexiscan Myoview 01/2016:   The left ventricular ejection fraction is normal (55-65%). The EF is 60% visually. The computer generated EF is not calculated correctly and is therefore not reported.  There was no ST segment deviation noted during stress.  The study is normal. no ischemia . no infarction   Echo 01/29/16: Study Conclusions  - Left ventricle: The cavity size was normal. Wall thickness was   increased in a pattern of moderate LVH. Systolic function was    normal. The estimated ejection fraction was in the range of 60%   to 65%. Basal inferior hypokinesis. Doppler parameters are   consistent with abnormal left ventricular  relaxation (grade 1   diastolic dysfunction). - Aortic valve: Trileaflet; moderately calcified leaflets. There   was very mild stenosis. There was mild regurgitation. Mean   gradient (S): 9 mm Hg. Valve area (VTI): 1.68 cm^2. - Mitral valve: There was trivial regurgitation. - Left atrium: The atrium was mildly dilated. - Right ventricle: The cavity size was normal. Systolic function   was normal. - Tricuspid valve: Peak RV-RA gradient (S): 44 mm Hg. - Pulmonary arteries: PA peak pressure: 52 mm Hg (S). - Systemic veins: IVC measured 2.3 cm with > 50% respirophasic   variation, suggesting RA pressure 8 mmHg.  Impressions:  - Normal LV size with moderate LV hypertrophy. EF 60-65%. Basal   inferior hypokinesis. Normal RV size and systolic function. Very   mild aortic stenosis, mild aortic insufficieny. Moderate   pulmonary hypertension.   Recent Labs: 08/08/2015: TSH 0.690 11/28/2015: Brain Natriuretic Peptide 54.9 03/31/2016: ALT 15; BUN 15; Creat 1.15; Hemoglobin 12.3; Platelets 194; Potassium 4.1; Sodium 143    Lipid Panel    Component Value Date/Time   CHOL 174 11/15/2015 0952   TRIG 75 11/15/2015 0952   HDL 60 11/15/2015 0952   CHOLHDL 2.9 11/15/2015 0952   VLDL 15 11/15/2015 0952   LDLCALC 99 11/15/2015 0952      Wt Readings from Last 3 Encounters:  03/27/16 228 lb (103.4 kg)  02/24/16 225 lb 9.6 oz (102.3 kg)  01/21/16 227 lb (103 kg)      ASSESSMENT AND PLAN:  # Hypertension:  BP is poorly-controlled.  However, she has not yet taken her antihypertensives today.  I have asked her to take her blood pressure medicine this evening she gets home. She will monitor her blood pressure twice a day for 2 weeks. She will return in 2 weeks to see our pharmacist in the hypertension clinic.  # Shortness of  breath: Ms. Glasser has shortness of breath but her exam is not consistent with heart failure. We will obtain an echocardiogram to evaluate for systolic or diastolic dysfunction as well as progressive valvular heart disease. She is euvolemic on exam and her BNP was within normal limits on April 6.  This makes it very unlikely that heart failure as the cause of her symptoms. Continue Lasix every other day.  I suspect that some of her shortness of breath may be attributable to poorly controlled hypertension. Her body habitus also does make it harder for her to breathe as she has a lot of central adiposity. As long as her heart is stable I like for her to start increasing her exercise to at least 30-40 minutes most days of the week. With weight loss her breathing should improve.  # Atypical Chest pain: Ms. Toya symptoms are very atypical and do not have any clear exertional component. However, she has several risk factors for coronary artery disease including HIV, hypertension, obesity,  and hyperlipidemia.  She does not think that she would be able to walk on a treadmill for a long period of time. We will obtain a Lexiscan Cardiolite to evaluate for ischemia. In 2013 she had a mild anterior wall defect that in retrospect was probably breast attenuation. Subsequent cath did not show obstructive coronary disease.  # Aortic regurgitation # Aortic stenosis: Ms. Jetter has mild aortic stenosis and mild to moderate aortic regurgitation. She does not have evidence of heart failure on exam. However she does have increased shortness of breath. We will obtain an echo to evaluate for progressive valvular heart  disease.  # PVCs: Ms. Eustice has PVCs on EKG but they are asymptomatic.  CBC and basic metabolic panel are unremarkable.   Current medicines are reviewed at length with the patient today.  The patient does not have concerns regarding medicines.  The following changes have been made:  no change  Labs/ tests ordered  today include:   No orders of the defined types were placed in this encounter.    Disposition:   FU with Clarke Amburn C. Oval Linsey, MD, [facility] in 3 months.  Follow up in hypertension clinic in 2 weeks.     This note was written with the assistance of speech recognition software.  Please excuse any transcriptional errors.  Signed, Devota Viruet C. Oval Linsey, MD, [facility]  04/13/2016 8:56 AM    [place]

## 2016-04-13 NOTE — ED Triage Notes (Signed)
Pt reports having fat mass as result of medications she is taking. Has a fat mass on left side of neck that she states is pressing on her nerves causing severe pain and numbness sensation to left side of body and sob. No neuro deficits noted at triage.

## 2016-04-14 ENCOUNTER — Encounter: Payer: Self-pay | Admitting: *Deleted

## 2016-04-14 ENCOUNTER — Ambulatory Visit (INDEPENDENT_AMBULATORY_CARE_PROVIDER_SITE_OTHER): Payer: Medicare Other | Admitting: Internal Medicine

## 2016-04-14 ENCOUNTER — Encounter: Payer: Self-pay | Admitting: Internal Medicine

## 2016-04-14 VITALS — BP 145/109 | HR 109 | Temp 98.2°F | Ht 63.0 in | Wt 225.5 lb

## 2016-04-14 DIAGNOSIS — M542 Cervicalgia: Secondary | ICD-10-CM

## 2016-04-14 DIAGNOSIS — M79601 Pain in right arm: Secondary | ICD-10-CM | POA: Diagnosis not present

## 2016-04-14 DIAGNOSIS — M5412 Radiculopathy, cervical region: Secondary | ICD-10-CM

## 2016-04-14 DIAGNOSIS — I10 Essential (primary) hypertension: Secondary | ICD-10-CM

## 2016-04-14 MED ORDER — NIFEDIPINE ER OSMOTIC RELEASE 30 MG PO TB24: 30.0000 mg | ORAL_TABLET | Freq: Every day | ORAL | 11 refills | Status: DC

## 2016-04-14 MED ORDER — LOSARTAN POTASSIUM 100 MG PO TABS: 100.0000 mg | ORAL_TABLET | Freq: Every day | ORAL | 3 refills | Status: DC

## 2016-04-14 MED ORDER — CYCLOBENZAPRINE HCL 5 MG PO TABS: 5.0000 mg | ORAL_TABLET | Freq: Three times a day (TID) | ORAL | 0 refills | Status: DC | PRN

## 2016-04-14 NOTE — Progress Notes (Signed)
    CC: neck pain  HPI: [name] is a 60 y.o. female with PMHx of HIV, Asthma, and Obesity who presents to the clinic for neck pain.   Patient was seen in the ED on 04/13/16 with complaint of acute left neck pain radiating down her right arm. ED physician felt her presentation and physical exam were most consistent with cervical radiculopathy and prescribed her flexeril 10 mg BID prn. There was low suspicion for thoracic outlet syndrome or SVC. CXR 2 view did not reveal any cervical abnormalities. Patient has not had formal cervical spine xrays. Patient presents today complaining of the same symptoms of pain in her neck radiating to her left shoulder and associated with decreased sensation at her face, neck and shoulder. She denies weakness or numbness in her upper left extremity. The flexeril helps her pain but makes her fatigued. She has been taking Tylenol 500 mg once a day.   Past Medical History:  Diagnosis Date  . Anemia   . Anxiety   . Arthritis    "starting to; in my hands" (07/09/2015)  . Chronic asthma with acute exacerbation    "I have chronic asthma all the time; sometimes exacerbations" (07/09/2015)  . Chronic lower back pain   . Cyst of right kidney    "3 of them; dx'd in ~ 01/2015"  . GERD (gastroesophageal reflux disease)   . Heart murmur   . History of blood transfusion    "related to my brain surgery I think"  . HIV antibody positive ([place])   . HIV disease ([place])   . Hyperlipidemia   . Hypertension   . Pneumonia 07/09/2015  . Sleep apnea    "never completed part 2 of study; never wore mask" (07/09/2015)    Review of Systems: Please see pertinent ROS reviewed in HPI and problem based charting.  Musculoskeletal: Admits to neck and left shoulder pain.  Skin: Denies rash and wounds.  Neurological: Admits to numbness. Denies weakness  Physical Exam: Vitals:   04/14/16 1358 04/14/16 1440  BP: (!) 154/107 (!) 145/109  Pulse: (!) 116 (!) 109  Temp: 98.2 F  (36.8 C)   TempSrc: Oral   SpO2: 95%   Weight: 225 lb 8 oz (102.3 kg)   Height: '5\' 3"'$  (1.6 m)    General: Vital signs reviewed.  Patient is obese, in no acute distress and cooperative with exam.  Neck: Buffalo hump with fat pad. Tender along palpation of cervical spine and into left trapezius muscle. +Spurling's test.  Cardiovascular: RRR, S1 normal, S2 normal, no murmurs, gallops, or rubs. Pulmonary/Chest: Clear to auscultation bilaterally, no wheezes, rales, or rhonchi. Neurological: Strength is normal and symmetric bilaterally in upper extremities Skin: Warm, dry and intact. No rashes or erythema.  Assessment & Plan:  See encounters tab for problem based medical decision making. Patient discussed with Dr. Daryll Drown.

## 2016-04-14 NOTE — Assessment & Plan Note (Signed)
BP Readings from Last 3 Encounters:  04/14/16 (!) 145/109  04/13/16 (!) 164/103  03/27/16 (!) 154/90    Lab Results  Component Value Date   NA 141 04/13/2016   K 3.9 04/13/2016   CREATININE 1.40 (H) 04/13/2016    Assessment: Blood pressure control:  Uncontrolled Progress toward BP goal:   Stagnant Comments: Compliant with losartan 100 mg daily but has not been taking Nifedipine 10 mg daily. I am unclear why patient is on this regimen and why on nifedipine at a low dose. She states she has been on this for a while prior to moving to [place]. Even while taking it, she feels her blood pressure has been uncontrolled. Upon chart review, I do not see a clear reason why she should remain on this medication or why she has not been tried on a higher dose or a different medication such as amlodipine.   Plan: Medications:  Refilled Losartan 100 mg daily. Refilled and changed Nifedipine to XR 30 mg daily.  Other plans: Follow up in 2-3 weeks with PCP with consideration of titration of Nifedipine versus a different agent

## 2016-04-14 NOTE — Progress Notes (Signed)
Letter mailed

## 2016-04-14 NOTE — Patient Instructions (Signed)
FOR YOUR BLOOD PRESSURE: TAKE NIFEDIPINE 30 MG ONCE A DAY AND CONTINUE TAKING LOSARTAN 100 MG ONCE A DAY. I HAVE SENT IN REFILLS.  FOR YOUR NECK PAIN: TAKE TYLENOL 500 MG EVERY 6 HOURS (4 TIMES A DAY) AS NEEDED FOR PAIN. TAKE FLEXERIL (CYCLOBENZAPRINE) 5 MG THREE TIMES A DAY AS NEEDED.  FOLLOW UP IN 2-3 WEEKS WITH YOUR PRIMARY CARE DOCTOR.

## 2016-04-14 NOTE — Assessment & Plan Note (Signed)
Patient was seen in the ED on 04/13/16 with complaint of acute left neck pain radiating down her right arm. ED physician felt her presentation and physical exam were most consistent with cervical radiculopathy and prescribed her flexeril 10 mg BID prn. There was low suspicion for thoracic outlet syndrome or SVC. CXR 2 view did not reveal any cervical abnormalities. Patient has not had formal cervical spine xrays. Patient presents today complaining of the same symptoms of pain in her neck radiating to her left shoulder and associated with decreased sensation at her face, neck and shoulder. She denies weakness or numbness in her upper left extremity. The flexeril helps her pain but makes her fatigued. She has been taking Tylenol 500 mg once a day. On exam, patient has a buffalo hump with fat pad at posterior neck. Tender along palpation of cervical spine and into left trapezius muscle. +Spurling's test.   Assessment: Cervical Radiculopathy  Plan: -Stretching, Ice, Heat -Tylenol 500 mg QID prn -Flexeril 5 mg TID prn -Patient has an appointment to see Neurosurgery on 04/22/16

## 2016-04-16 ENCOUNTER — Encounter: Payer: Self-pay | Admitting: Internal Medicine

## 2016-04-16 ENCOUNTER — Ambulatory Visit (INDEPENDENT_AMBULATORY_CARE_PROVIDER_SITE_OTHER): Payer: Medicare Other | Admitting: Internal Medicine

## 2016-04-16 VITALS — BP 199/117 | HR 102 | Temp 97.4°F | Wt 225.0 lb

## 2016-04-16 DIAGNOSIS — B20 Human immunodeficiency virus [HIV] disease: Secondary | ICD-10-CM | POA: Diagnosis not present

## 2016-04-16 DIAGNOSIS — J4520 Mild intermittent asthma, uncomplicated: Secondary | ICD-10-CM

## 2016-04-16 DIAGNOSIS — I10 Essential (primary) hypertension: Secondary | ICD-10-CM

## 2016-04-16 DIAGNOSIS — M791 Myalgia: Secondary | ICD-10-CM | POA: Diagnosis not present

## 2016-04-16 DIAGNOSIS — M7918 Myalgia, other site: Secondary | ICD-10-CM

## 2016-04-16 MED ORDER — ALBUTEROL SULFATE (2.5 MG/3ML) 0.083% IN NEBU
2.5000 mg | INHALATION_SOLUTION | Freq: Once | RESPIRATORY_TRACT | Status: AC
  Administered 2016-04-16: 2.5 mg via RESPIRATORY_TRACT

## 2016-04-16 MED ORDER — METHOCARBAMOL 500 MG PO TABS: 500.0000 mg | ORAL_TABLET | Freq: Four times a day (QID) | ORAL | 0 refills | Status: DC

## 2016-04-16 NOTE — Progress Notes (Signed)
Patient ID: Shannon [name], female   DOB: 12/07/1955, 60 y.o.   MRN: [id number]  HPI 60yo F with advanced hiv disease, asthma, diastolic dysfunction, osa, gerd. CD 4 count of 510/VL<20 on tivicay-descovy. She had mammo in early august which was normal.  This morning in clinic, she is wheezy, and pulse ox of 88% for which she has not taken her morning inhalers  She went to the ED on 8/21 due to concern for stroke like symptoms. She was was recently diagnosed with left neck pain, numbness to left side of her face. She was diagnosed with a cervcial radiculopathy for which she was referred to [place], Dr. Cyndy Freeze this coming week. She was given muscle relaxants to help.  She states that the flexeril she was given has caused some significant sedation.  Outpatient Encounter Prescriptions as of 04/16/2016  Medication Sig  . acetaminophen (TYLENOL) 500 MG tablet Take 500 mg by mouth every 6 (six) hours as needed for mild pain.  [name] albuterol (PROAIR HFA) 108 (90 Base) MCG/ACT inhaler Inhale 2 puffs into the lungs every 6 (six) hours as needed for wheezing or shortness of breath.  [name] albuterol (PROVENTIL) (2.5 MG/3ML) 0.083% nebulizer solution Take 3 mLs (2.5 mg total) by nebulization every 6 (six) hours as needed for wheezing or shortness of breath.  . ALPRAZolam (XANAX) 0.5 MG tablet Take 1 tablet (0.5 mg total) by mouth at bedtime. (Patient not taking: Reported on 03/27/2016)  . Biotin 1000 MCG tablet Take 1,000 mcg by mouth daily. Takes every 2-3 days   . budesonide (PULMICORT) 0.5 MG/2ML nebulizer solution Take 2 mLs (0.5 mg total) by nebulization 2 (two) times daily. (Patient not taking: Reported on 03/27/2016)  . Coenzyme Q10 (COQ10 PO) Take 1 tablet by mouth daily.  . cyanocobalamin (,VITAMIN B-12,) 1000 MCG/ML injection Inject 1 mL (1,000 mcg total) into the muscle every 30 (thirty) days.  . cyclobenzaprine (FLEXERIL) 5 MG tablet Take 1 tablet (5 mg total) by mouth 3 (three) times daily as needed for  muscle spasms.  [name] dextromethorphan-guaiFENesin (MUCINEX DM) 30-600 MG 12hr tablet Take 1 tablet by mouth 2 (two) times daily. (Patient not taking: Reported on 03/27/2016)  . dolutegravir (TIVICAY) 50 MG tablet Take 1 tablet (50 mg total) by mouth daily.  [name] emtricitabine-tenofovir AF (DESCOVY) 200-25 MG tablet Take 1 tablet by mouth daily.  [name] ENSURE (ENSURE) Take 237 mLs by mouth 2 (two) times daily between meals. (Patient taking differently: Take 237 mLs by mouth See admin instructions. Drink 237 ml every 2 days)  . fluticasone (FLONASE) 50 MCG/ACT nasal spray Place 2 sprays into both nostrils daily. (Patient taking differently: Place 2 sprays into both nostrils daily as needed for allergies. )  . furosemide (LASIX) 20 MG tablet Take 1 tablet (20 mg total) by mouth every other day. (Patient taking differently: Take 20 mg by mouth daily as needed for fluid. )  . ipratropium-albuterol (DUONEB) 0.5-2.5 (3) MG/3ML SOLN Inhale 3 mLs into the lungs every 6 (six) hours as needed (for shortness of breath).   . L-Lysine 500 MG CAPS Take 1 capsule by mouth once a week.  . loratadine (CLARITIN) 10 MG tablet Take 1 tablet (10 mg total) by mouth daily as needed for allergies. (Patient not taking: Reported on 03/27/2016)  . losartan (COZAAR) 100 MG tablet Take 1 tablet (100 mg total) by mouth daily.  . Multiple Vitamin (MULTIVITAMIN WITH MINERALS) TABS tablet Take 1 tablet by mouth daily. (Patient taking differently: Take 1  tablet by mouth See admin instructions. Take 1 tablet by mouth every 2 days)  . Multiple Vitamins-Minerals (ZINC) LOZG Take 1 lozenge by mouth daily. (Patient not taking: Reported on 04/13/2016)  . NIFEdipine (PROCARDIA-XL/ADALAT-CC/NIFEDICAL-XL) 30 MG 24 hr tablet Take 1 tablet (30 mg total) by mouth daily.  [name] omeprazole (PRILOSEC) 40 MG capsule TAKE ONE CAPSULE BY MOUTH EVERY DAY (Patient taking differently: Take 40 mg by mouth daily as needed for acid reflux)  . POTASSIUM PO Take 1 tablet by mouth  daily.  [name] PREMARIN vaginal cream Place 1 application vaginally once a week.  . SYRINGE/NEEDLE, DISP, 1 ML (B-D SYRINGE/NEEDLE 1CC/25GX5/8) 25G X 5/8" 1 ML MISC 1 Units by Does not apply route every 30 (thirty) days. (Patient not taking: Reported on 04/13/2016)  . Tetrahydrozoline HCl (VISINE OP) Place 2 drops into both eyes as needed (for dry eyes).  [name] tiotropium (SPIRIVA HANDIHALER) 18 MCG inhalation capsule Place 1 capsule (18 mcg total) into inhaler and inhale daily.  . vitamin C (ASCORBIC ACID) 500 MG tablet Take 2 tablets (1,000 mg total) by mouth daily. (Patient taking differently: Take 500 mg by mouth daily. )  . ZETIA 10 MG tablet Take 1 tablet by mouth  daily (Patient taking differently: Take 10 mg by mouth daily)   Facility-Administered Encounter Medications as of 04/16/2016  Medication  . technetium tetrofosmin (TC-MYOVIEW) injection 29.9 milli [place]     Patient Active Problem List   Diagnosis Date Noted  . Cervical radiculopathy 04/14/2016  . Sinusitis, acute 02/24/2016  . Cough 01/02/2016  . GERD (gastroesophageal reflux disease) 01/02/2016  . Exertional dyspnea 12/04/2015  . OSA (obstructive sleep apnea) 10/10/2015  . Obesity 10/10/2015  . Vitamin B12 deficiency 10/02/2015  . Inappropriate diet and eating habits 08/15/2015  . CAP (community acquired pneumonia) 07/29/2015  . Opacity noted on imaging study   . Dyspnea 07/09/2015  . History of pulmonary embolism 07/09/2015  . Asthma, moderate persistent 07/09/2015  . HIV disease ([place]) 07/09/2015  . Essential hypertension 07/09/2015  . Mild diastolic dysfunction 40/05/2724     Health Maintenance Due  Topic Date Due  . TETANUS/TDAP  03/03/1973  . PAP SMEAR  03/04/1975  . COLONOSCOPY  03/03/2004  . ZOSTAVAX  03/03/2014  . INFLUENZA VACCINE  03/24/2016     Review of Systems  Constitutional: Negative for fever, chills, diaphoresis, activity change, appetite change, fatigue and unexpected weight change.  HENT:  Negative for congestion, sore throat, rhinorrhea, sneezing, trouble swallowing and sinus pressure.  Eyes: Negative for photophobia and visual disturbance.  Respiratory: + shortness of breath, improved with inhaler. Negative for cough, chest tightness, shortness of breath, wheezing and stridor.  Cardiovascular: Negative for chest pain, palpitations and leg swelling.  Gastrointestinal: Negative for nausea, vomiting, abdominal pain, diarrhea, constipation, blood in stool, abdominal distention and anal bleeding.  Genitourinary: Negative for dysuria, hematuria, flank pain and difficulty urinating.  Musculoskeletal: Negative for myalgias, back pain, joint swelling, arthralgias and gait problem.  Skin: Negative for color change, pallor, rash and wound.  Neurological: Negative for dizziness, tremors, weakness and light-headedness.  Hematological: Negative for adenopathy. Does not bruise/bleed easily.  Psychiatric/Behavioral:  +anxious about her health   Physical Exam   BP (!) 199/117   Pulse (!) 102   Temp 97.4 F (36.3 C) (Oral)   Wt 225 lb (102.1 kg)   SpO2 (!) 88%   BMI 39.86 kg/m  Physical Exam  Constitutional:  oriented to person, place, and time. appears well-developed and well-nourished. No distress.  HENT: [place]/AT, PERRLA, no scleral icterus Mouth/Throat: Oropharynx is clear and moist. No oropharyngeal exudate.  Cardiovascular: Normal rate, regular rhythm and normal heart sounds. Exam reveals no gallop and no friction rub.  No murmur heard.  Pulmonary/Chest: Effort normal and breath sounds normal. No respiratory distress.  has no wheezes.  Neck = supple, no nuchal rigidity Abdominal: Soft. Bowel sounds are normal.  exhibits no distension. There is no tenderness.  Lymphadenopathy: no cervical adenopathy. No axillary adenopathy Neurological: alert and oriented to person, place, and time.  Skin: Skin is warm and dry. No rash noted. No erythema.  Psychiatric: a normal mood and affect.   behavior is normal.    Lab Results  Component Value Date   CD4TCELL 35 03/31/2016   Lab Results  Component Value Date   CD4TABS 510 03/31/2016   CD4TABS 470 01/07/2016   CD4TABS 490 11/15/2015   Lab Results  Component Value Date   HIV1RNAQUANT <20 03/31/2016   No results found for: HEPBSAB No results found for: RPR  CBC Lab Results  Component Value Date   WBC 5.9 04/13/2016   RBC 4.44 04/13/2016   HGB 13.2 04/13/2016   HCT 41.7 04/13/2016   PLT 191 04/13/2016   MCV 93.9 04/13/2016   MCH 29.7 04/13/2016   MCHC 31.7 04/13/2016   RDW 13.1 04/13/2016   LYMPHSABS 1.4 03/27/2016   MONOABS 0.5 03/27/2016   EOSABS 0.1 03/27/2016   BASOSABS 0.0 03/27/2016   BMET Lab Results  Component Value Date   NA 141 04/13/2016   K 3.9 04/13/2016   CL 105 04/13/2016   CO2 29 04/13/2016   GLUCOSE 140 (H) 04/13/2016   BUN 17 04/13/2016   CREATININE 1.40 (H) 04/13/2016   CALCIUM 9.9 04/13/2016   GFRNONAA 39 (L) 04/13/2016   GFRAA 46 (L) 04/13/2016     Assessment and Plan    Radicular neck pain = has nsgy appt. Next week. Plan to change her flexeril to robaxin since too sedating. Can also use topical OTC cream to see if can help to help with symptoms  hiv disease = well controlled, continue her current regimen  Asthma = asked her to take her inhalers before she leaves the home. She was recently seen by pulmonary who have scheduled her for pulm rehab as well as sleep study to look into osa, both agree would be useful  htn = poorly controlled. On repeat, appeared slightly improved. Recommend to follow up for pcp to adjut medictaions.

## 2016-04-17 ENCOUNTER — Ambulatory Visit (INDEPENDENT_AMBULATORY_CARE_PROVIDER_SITE_OTHER): Payer: Medicare Other | Admitting: Cardiovascular Disease

## 2016-04-17 ENCOUNTER — Encounter: Payer: Self-pay | Admitting: Cardiovascular Disease

## 2016-04-17 VITALS — BP 182/110 | HR 98 | Ht 63.0 in | Wt 228.0 lb

## 2016-04-17 DIAGNOSIS — R0602 Shortness of breath: Secondary | ICD-10-CM

## 2016-04-17 DIAGNOSIS — E661 Drug-induced obesity: Secondary | ICD-10-CM | POA: Diagnosis not present

## 2016-04-17 DIAGNOSIS — I10 Essential (primary) hypertension: Secondary | ICD-10-CM

## 2016-04-17 DIAGNOSIS — E881 Lipodystrophy, not elsewhere classified: Secondary | ICD-10-CM | POA: Diagnosis not present

## 2016-04-17 MED ORDER — NIFEDIPINE ER OSMOTIC RELEASE 60 MG PO TB24: 60.0000 mg | ORAL_TABLET | Freq: Every day | ORAL | 11 refills | Status: DC

## 2016-04-17 NOTE — Patient Instructions (Addendum)
Medication Instructions:  INCREASE YOUR NIFEDIPINE TO 60 MG DAILY  TAKE ONE OF YOUR 30 MG TABLETS NOW   Labwork: NONE  Testing/Procedures: NONE  Follow-Up: Your physician recommends that you schedule a follow-up appointment in: [facility]  Your physician recommends that you schedule a follow-up appointment in: [address]  If you need a refill on your cardiac medications before your next appointment, please call your pharmacy.

## 2016-04-17 NOTE — Progress Notes (Signed)
Cardiology Office Note   Date:  04/17/2016   ID:  [name], Shannon [name] 07/31/1956, MRN [id number]  PCP:  Lucious Groves, DO  Cardiologist:   Skeet Latch, MD  ID: Shannon Basques, MD  Chief Complaint  Patient presents with  . Follow-up    sob; in mornings. lightheaded; during the day frequently. pain in legs. edema; in feet occasionally.     History of Present Illness: Shannon [name] is a 60 y.o. female with hypertension, asthma, OSA, prior PE and HIV (well-controlled)  who presents for follow up on shortness of breath.  Shannon [name] saw her infectious disease doctor, Shannon Basques, MD, on 12/26/15. [name] was concerned about her lower extremity edema and shortness of breath.  She was started on lasix 20 mg every other day.  Shannon [name] noted shortness of breath since her hospitalization for pneumonia in November.   She denied lower extremity edema or orthopnea but did report chest pain when laying down at night.  She was referred for an echo 01/2016 that revealed LVEF 60-65% awith grade 1 diastolic dysfunction and hypokinesis of the basal inferior wall.  She also had very mild aortic stenosis with a mean gradient of 9 mmHg and trivial AR.  PASP was 52 mmHg.  She had a Lexiscan Myoview at that time that was negative for ischemia.  Shannon [name] previously had a Lexiscan Cardiolite in September 2013 that revealed a medium, mild reversible defect in the anterior wall and normal systolic function. She subsequently underwent cardiac catheterization that revealed nonobstructive coronary disease. She had mild pulmonary hypertension and a normal cardiac output. Left ventriculography revealed mild to moderate aortic regurgitation. She had an echo May 2016 that revealed an ejection fraction of 98% and mild diastolic dysfunction. It also revealed calcification of the aortic valve, mild to moderate aortic regurgitation, and mild aortic stenosis. Peak velocity was 2.1 m/s. She had a 24-hour Holter in February 2015 that  revealed rare PACs and PVCs.   Shannon [name] sees [facility] for OSA and severe asthma.  She was recently referred for a sleep study and will be going for her sleep study in 2 weeks.  Her main concern today was lipodistrophy and weight gain.  She does not get any formal exercise and thinks that this may be why she is gaining weight. She has been tried to limit her sugar intake and does not eat red meat.  She has a fat pad on the back of her neck that is causing pain in her left shoulder and arm.  She plans to start working out in the gym to lose weight.  She has not been checking her blood pressure regularly but she has been taking her medication as prescribed.      Past Medical History:  Diagnosis Date  . Anemia   . Anxiety   . Arthritis    "starting to; in my hands" (07/09/2015)  . Chronic asthma with acute exacerbation    "I have chronic asthma all the time; sometimes exacerbations" (07/09/2015)  . Chronic lower back pain   . Cyst of right kidney    "3 of them; dx'd in ~ 01/2015"  . GERD (gastroesophageal reflux disease)   . Heart murmur   . History of blood transfusion    "related to my brain surgery I think"  . HIV antibody positive ([place])   . HIV disease ([name])   . Hyperlipidemia   . Hypertension   . Pneumonia 07/09/2015  . Sleep  apnea    "never completed part 2 of study; never wore mask" (07/09/2015)    Past Surgical History:  Procedure Laterality Date  . ABDOMINAL HYSTERECTOMY     "robotic laparosopic"  . BRAIN SURGERY  1974   "brain tumor; benign; on top of my brain; got a plate in there"  . CARDIAC CATHETERIZATION    . TONSILLECTOMY AND ADENOIDECTOMY       Current Outpatient Prescriptions  Medication Sig Dispense Refill  . losartan (COZAAR) 100 MG tablet Take 100 mg by mouth daily.    [name] acetaminophen (TYLENOL) 500 MG tablet Take 500 mg by mouth every 6 (six) hours as needed for mild pain.    [name] albuterol (PROAIR HFA) 108 (90 Base) MCG/ACT inhaler Inhale 2 puffs  into the lungs every 6 (six) hours as needed for wheezing or shortness of breath. 6.7 g 3  . albuterol (PROVENTIL) (2.5 MG/3ML) 0.083% nebulizer solution Take 3 mLs (2.5 mg total) by nebulization every 6 (six) hours as needed for wheezing or shortness of breath. 75 mL 3  . Biotin 1000 MCG tablet Take 1,000 mcg by mouth daily. Takes every 2-3 days     . Coenzyme Q10 (COQ10 PO) Take 1 tablet by mouth daily.    . cyanocobalamin (,VITAMIN B-12,) 1000 MCG/ML injection Inject 1 mL (1,000 mcg total) into the muscle every 30 (thirty) days. 1 mL 3  . cyclobenzaprine (FLEXERIL) 5 MG tablet Take 1 tablet (5 mg total) by mouth 3 (three) times daily as needed for muscle spasms. 30 tablet 0  . dextromethorphan-guaiFENesin (MUCINEX DM) 30-600 MG 12hr tablet Take 1 tablet by mouth 2 (two) times daily. 14 tablet 0  . dolutegravir (TIVICAY) 50 MG tablet Take 1 tablet (50 mg total) by mouth daily. 30 tablet 11  . emtricitabine-tenofovir AF (DESCOVY) 200-25 MG tablet Take 1 tablet by mouth daily. 30 tablet 11  . ENSURE (ENSURE) Take 237 mLs by mouth 2 (two) times daily between meals. (Patient taking differently: Take 237 mLs by mouth See admin instructions. Drink 237 ml every 2 days) 237 mL 11  . fluticasone (FLONASE) 50 MCG/ACT nasal spray Place 2 sprays into both nostrils daily. (Patient taking differently: Place 2 sprays into both nostrils daily as needed for allergies. ) 16 g 5  . furosemide (LASIX) 20 MG tablet Take 1 tablet (20 mg total) by mouth every other day. (Patient taking differently: Take 20 mg by mouth daily as needed for fluid. ) 45 tablet 1  . L-Lysine 500 MG CAPS Take 1 capsule by mouth once a week.    . loratadine (CLARITIN) 10 MG tablet Take 1 tablet (10 mg total) by mouth daily as needed for allergies. 30 tablet 2  . methocarbamol (ROBAXIN) 500 MG tablet Take 1 tablet (500 mg total) by mouth 4 (four) times daily. 60 tablet 0  . Multiple Vitamin (MULTIVITAMIN WITH MINERALS) TABS tablet Take 1 tablet  by mouth daily. (Patient taking differently: Take 1 tablet by mouth See admin instructions. Take 1 tablet by mouth every 2 days) 90 tablet   . Multiple Vitamins-Minerals (ZINC) LOZG Take 1 lozenge by mouth daily. 100 lozenge 0  . NIFEdipine (PROCARDIA XL/ADALAT-CC) 60 MG 24 hr tablet Take 1 tablet (60 mg total) by mouth daily. 30 tablet 11  . POTASSIUM PO Take 1 tablet by mouth daily.    [name] PREMARIN vaginal cream Place 1 application vaginally once a week.    . SYRINGE/NEEDLE, DISP, 1 ML (B-D SYRINGE/NEEDLE 1CC/25GX5/8) 25G X 5/8"  1 ML MISC 1 Units by Does not apply route every 30 (thirty) days. 50 each 0  . Tetrahydrozoline HCl (VISINE OP) Place 2 drops into both eyes as needed (for dry eyes).    [name] tiotropium (SPIRIVA HANDIHALER) 18 MCG inhalation capsule Place 1 capsule (18 mcg total) into inhaler and inhale daily. 30 capsule 3  . vitamin C (ASCORBIC ACID) 500 MG tablet Take 2 tablets (1,000 mg total) by mouth daily. (Patient taking differently: Take 500 mg by mouth daily. ) 180 tablet 0  . ZETIA 10 MG tablet Take 1 tablet by mouth  daily (Patient taking differently: Take 10 mg by mouth daily) 90 tablet 0   No current facility-administered medications for this visit.    Facility-Administered Medications Ordered in Other Visits  Medication Dose Route Frequency Provider Last Rate Last Dose  . technetium tetrofosmin (TC-MYOVIEW) injection 29.9 milli Curie  08.6 millicurie Intravenous Once PRN Dorothy Spark, MD        Allergies:   Tree extract and Ciprofloxacin    Social History:  The patient  reports that she has never smoked. She has never used smokeless tobacco. She reports that she drinks alcohol. She reports that she does not use drugs.   Family History:  The patient's family history includes Asthma in her mother; Diabetes in her sister; Heart failure in her mother; Heart murmur in her brother, sister, and sister; Thyroid disease in her sister.    ROS:  Please see the history of  present illness.   Otherwise, review of systems are positive for none.   All other systems are reviewed and negative.    PHYSICAL EXAM: VS:  BP (!) 182/110   Pulse 98   Ht '5\' 3"'$  (1.6 m)   Wt 228 lb (103.4 kg)   BMI 40.39 kg/m  , BMI Body mass index is 40.39 kg/m. GENERAL:  Well appearing HEENT:  Pupils equal round and reactive, fundi not visualized, oral mucosa unremarkable NECK:  No jugular venous distention, waveform within normal limits, carotid upstroke brisk and symmetric, no bruits, no thyromegaly LYMPHATICS:  No cervical adenopathy LUNGS:  Clear to auscultation bilaterally HEART:  RRR.  PMI not displaced or sustained,S1 and S2 within normal limits, no S3, no S4, no clicks, no rubs, II/VI early-peaking systolic murmur at the right upper sternal border. I/IV diastolic murmur at the left upper sternal border.  ABD:  Flat, positive bowel sounds normal in frequency in pitch, no bruits, no rebound, no guarding, no midline pulsatile mass, no hepatomegaly, no splenomegaly EXT:  2 plus pulses throughout, no edema, no cyanosis no clubbing SKIN:  No rashes no nodules NEURO:  Cranial nerves II through XII grossly intact, motor grossly intact throughout PSYCH:  Cognitively intact, oriented to person place and time   EKG:  EKG is ordered today. The ekg ordered today desinus rhythm. Rate 89 bpm. 2 PVCs.  Coronary angiography 06/01/12:10-20% OM, diffuse 20-30% LAD, 10-20% D1, 20% diffuse RCA RA 10, RV 39/12, RV EDP 12, capillary wedge pressure 19, PA 39/21, mean 27, LV 116/14, LVEDP 20, aorta 115/77 Cardiac output (Fick) 6.55, cardiac index 3.2 PVR 1.7 with units, SVR 12 with units. She P/QRS ratio 1 LVEF 80%. Aortic root mildly dilated. 1-2+ aortic regurgitation.  24-hour Holter 09/26/13: Sinus rhythm, rare PACs, rare PVCs  Echo 01/22/15: LVEF 70%. Mild LVH. Mild diastolic dysfunction. Normal RV function. Left atrium moderately enlarged. Right atrium mildly enlarged. Trace mitral  regurgitation. Calcific aortic valve. Mild to moderate aortic regurgitation.  Mild aortic stenosis. Peak velocity 2.1 m/s trace tricuspid regurgitation    Lexiscan Myoview 01/2016:   The left ventricular ejection fraction is normal (55-65%). The EF is 60% visually. The computer generated EF is not calculated correctly and is therefore not reported.  There was no ST segment deviation noted during stress.  The study is normal. no ischemia . no infarction   Echo 01/29/16: Study Conclusions  - Left ventricle: The cavity size was normal. Wall thickness was   increased in a pattern of moderate LVH. Systolic function was   normal. The estimated ejection fraction was in the range of 60%   to 65%. Basal inferior hypokinesis. Doppler parameters are   consistent with abnormal left ventricular relaxation (grade 1   diastolic dysfunction). - Aortic valve: Trileaflet; moderately calcified leaflets. There   was very mild stenosis. There was mild regurgitation. Mean   gradient (S): 9 mm Hg. Valve area (VTI): 1.68 cm^2. - Mitral valve: There was trivial regurgitation. - Left atrium: The atrium was mildly dilated. - Right ventricle: The cavity size was normal. Systolic function   was normal. - Tricuspid valve: Peak RV-RA gradient (S): 44 mm Hg. - Pulmonary arteries: PA peak pressure: 52 mm Hg (S). - Systemic veins: IVC measured 2.3 cm with > 50% respirophasic   variation, suggesting RA pressure 8 mmHg.  Impressions:  - Normal LV size with moderate LV hypertrophy. EF 60-65%. Basal   inferior hypokinesis. Normal RV size and systolic function. Very   mild aortic stenosis, mild aortic insufficieny. Moderate   pulmonary hypertension.   Recent Labs: 08/08/2015: TSH 0.690 11/28/2015: Brain Natriuretic Peptide 54.9 03/31/2016: ALT 15 04/13/2016: BUN 17; Creatinine, Ser 1.40; Hemoglobin 13.2; Platelets 191; Potassium 3.9; Sodium 141    Lipid Panel    Component Value Date/Time   CHOL 174 11/15/2015  0952   TRIG 75 11/15/2015 0952   HDL 60 11/15/2015 0952   CHOLHDL 2.9 11/15/2015 0952   VLDL 15 11/15/2015 0952   LDLCALC 99 11/15/2015 0952      Wt Readings from Last 3 Encounters:  04/17/16 228 lb (103.4 kg)  04/16/16 225 lb (102.1 kg)  04/14/16 225 lb 8 oz (102.3 kg)      ASSESSMENT AND PLAN:  # Hypertension:  BP is poorly-controlled.  She is concerned about increasing her medication too much and her blood pressure getting low.  We will increase nifedipine to 60 mg daily.  I have asked her to take a nifedipine 30 mg tablet now.  She will follow up in our hypertension clinic in two weeks.   # Shortness of breath: Stress test was normal and echo revealed normal systolic function with grade 1 diastolic dysfunction.  This is likely related to her central adiposity and poorly-controlled blood pressure.  I agree that weight loss should help.  # Aortic regurgitation # Aortic stenosis: Ms. Fronczak has very mild aortic stenosis (mean gradient 9 mmHg) and mild aortic regurgitation.  Continue to monitor.   Current medicines are reviewed at length with the patient today.  The patient does not have concerns regarding medicines.  The following changes have been made:  no change  Time spent: 30 minutes-Greater than 50% of this time was spent in counseling, explanation of diagnosis, planning of further management, and coordination of care.   Labs/ tests ordered today include:   No orders of the defined types were placed in this encounter.    Disposition:   FU with Kahley Leib C. Oval Linsey, MD, [facility] in 1  month.  Follow up in hypertension clinic in 2 weeks.     This note was written with the assistance of speech recognition software.  Please excuse any transcriptional errors.  Signed, Koal Eslinger C. Oval Linsey, MD, [facility]  04/17/2016 4:51 PM    [facility]

## 2016-04-17 NOTE — Progress Notes (Signed)
Internal Medicine Clinic Attending  Case discussed with Dr. Burns at the time of the visit.  We reviewed the resident's history and exam and pertinent patient test results.  I agree with the assessment, diagnosis, and plan of care documented in the resident's note.  

## 2016-04-22 DIAGNOSIS — D496 Neoplasm of unspecified behavior of brain: Secondary | ICD-10-CM | POA: Diagnosis not present

## 2016-04-22 DIAGNOSIS — I10 Essential (primary) hypertension: Secondary | ICD-10-CM | POA: Diagnosis not present

## 2016-04-22 DIAGNOSIS — M542 Cervicalgia: Secondary | ICD-10-CM | POA: Diagnosis not present

## 2016-04-28 ENCOUNTER — Encounter: Payer: Self-pay | Admitting: Internal Medicine

## 2016-04-28 ENCOUNTER — Ambulatory Visit (INDEPENDENT_AMBULATORY_CARE_PROVIDER_SITE_OTHER): Payer: Medicare Other | Admitting: Internal Medicine

## 2016-04-28 DIAGNOSIS — R0600 Dyspnea, unspecified: Secondary | ICD-10-CM

## 2016-04-28 DIAGNOSIS — Z21 Asymptomatic human immunodeficiency virus [HIV] infection status: Secondary | ICD-10-CM

## 2016-04-28 DIAGNOSIS — Z79899 Other long term (current) drug therapy: Secondary | ICD-10-CM

## 2016-04-28 DIAGNOSIS — M5412 Radiculopathy, cervical region: Secondary | ICD-10-CM

## 2016-04-28 DIAGNOSIS — I10 Essential (primary) hypertension: Secondary | ICD-10-CM

## 2016-04-28 NOTE — Patient Instructions (Signed)
It was pleasure taking care of you today. Your BP was high today, you recently increase one of your BP medicine called Nifedipine, we want to see it's response. Please log your BP in the provided chart and bring it back to [place] at your 2 wks f/u We will wait for your MRI to decide further management about your neck pain.

## 2016-04-28 NOTE — Assessment & Plan Note (Signed)
She is having uncontrolled HTN. Today her BP was 164/103. She was anxious about her medical issues.  She was recently seen at cardiology clinic and they increase her Nifedipine to 60 mg and refer her to their hypertension clinic. Plan. -She was given BP log. - No change in her meds. As recent dose increase during last week. - F/U in 2 wks. And if still remain hypertensive, can consider either going up on Nifedipine or adding another agent.

## 2016-04-28 NOTE — Progress Notes (Signed)
   CC: Left sided neck pain  HPI:  Shannon [name] is a 60 y.o. with PMH as listed below came to the clinic with c/o left sided neck pain, radiating to her left shoulder and c/o tinging and numbness of her left face.She denies weakness or numbness in her upper left extremity. The flexeril helps her pain but makes her fatigued. She recent being seen by Neurology and waiting for her brain and Cervical spine MRI pre authorization. She believes that she had sinus infection causing her facial dull ache and wants to get a antibiotic prescription.She denies any fever, chills, nasal discharge or congestion., admits having mild PND. She had h/o allergies , for that she was given Claritin but she is not taking it as she do not want to add another medicine. She has been recently seen in cardiology clinic, they increase her Nifedipine to 60 mg and  Referred her to hypertension clinic for her uncontrolled hypertension, she has not make an appointment yet. She is being followed up by Infectious disease clinic for her HIV and states that she is compliant with her meds. She also follow up with Pulmonary for her asthma management.   Past Medical History:  Diagnosis Date  . Anemia   . Anxiety   . Arthritis    "starting to; in my hands" (07/09/2015)  . Chronic asthma with acute exacerbation    "I have chronic asthma all the time; sometimes exacerbations" (07/09/2015)  . Chronic lower back pain   . Cyst of right kidney    "3 of them; dx'd in ~ 01/2015"  . GERD (gastroesophageal reflux disease)   . Heart murmur   . History of blood transfusion    "related to my brain surgery I think"  . HIV antibody positive ([place])   . HIV disease ([place])   . Hyperlipidemia   . Hypertension   . Pneumonia 07/09/2015  . Sleep apnea    "never completed part 2 of study; never wore mask" (07/09/2015)    Review of Systems:  As per HPI.  Physical Exam:  Vitals:   04/28/16 1425  BP: (!) 164/103  Pulse: 94  Temp: 97.9 F  (36.6 C)  TempSrc: Oral  SpO2: 95%  Weight: 229 lb 9.6 oz (104.1 kg)  Height: 5' 3.5" (1.613 m)   General. Well built lady with some central obesity, looks anxious, NAD HEENT: No facial tenderness, No nasal or pharyngeal erythema or exude.Facial sensations intact on both sides. Buffalo hump with fat pad. Tender along palpation of cervical spine and into left trapezius muscle.  Lungs.Clear bilaterally CV. Soft, non tender, non distended, BS +ve. Extremities. 1+ bilateral edema up to mid legs. Pulses. 2+ bilaterally.  Assessment & Plan:   See Encounters Tab for problem based charting.  Patient seen with Dr. Daryll Drown

## 2016-04-28 NOTE — Assessment & Plan Note (Signed)
Her SOB has improved,but still have some dyspnea after walking about a block. She recently started exercise program with a trainer and feeling good about it.She is compliant with her inhalers.

## 2016-04-28 NOTE — Assessment & Plan Note (Signed)
She came to the clinic with c/o left sided neck pain, radiating to her left shoulder and left side of her face, associated with some tinging and numbness of her left face.She denies weakness or numbness in her upper left extremity. The flexeril helps her pain but makes her fatigued. She recent being seen by Neurology and waiting for her brain and Cervical spine MRI pre authorization. -Wait for MRI results -Can add some gabapentin or lyrica for persistent pain.

## 2016-04-30 NOTE — Progress Notes (Signed)
Internal Medicine Clinic Attending  I saw and evaluated the patient.  I personally confirmed the key portions of the history and exam documented by Dr. Amin and I reviewed pertinent patient test results.  The assessment, diagnosis, and plan were formulated together and I agree with the documentation in the resident's note. 

## 2016-05-01 ENCOUNTER — Ambulatory Visit: Payer: Medicare Other

## 2016-05-05 DIAGNOSIS — D496 Neoplasm of unspecified behavior of brain: Secondary | ICD-10-CM | POA: Diagnosis not present

## 2016-05-07 DIAGNOSIS — M542 Cervicalgia: Secondary | ICD-10-CM | POA: Diagnosis not present

## 2016-05-07 DIAGNOSIS — D496 Neoplasm of unspecified behavior of brain: Secondary | ICD-10-CM | POA: Diagnosis not present

## 2016-05-07 DIAGNOSIS — R51 Headache: Secondary | ICD-10-CM | POA: Diagnosis not present

## 2016-05-07 DIAGNOSIS — M4802 Spinal stenosis, cervical region: Secondary | ICD-10-CM | POA: Diagnosis not present

## 2016-05-11 ENCOUNTER — Ambulatory Visit (HOSPITAL_BASED_OUTPATIENT_CLINIC_OR_DEPARTMENT_OTHER): Payer: Medicare Other | Attending: Pulmonary Disease | Admitting: Pulmonary Disease

## 2016-05-11 ENCOUNTER — Ambulatory Visit: Payer: Medicare Other | Admitting: Cardiovascular Disease

## 2016-05-11 DIAGNOSIS — G4733 Obstructive sleep apnea (adult) (pediatric): Secondary | ICD-10-CM | POA: Diagnosis not present

## 2016-05-11 DIAGNOSIS — R5383 Other fatigue: Secondary | ICD-10-CM | POA: Diagnosis present

## 2016-05-11 DIAGNOSIS — G4710 Hypersomnia, unspecified: Secondary | ICD-10-CM

## 2016-05-11 DIAGNOSIS — R0683 Snoring: Secondary | ICD-10-CM | POA: Diagnosis present

## 2016-05-11 DIAGNOSIS — E669 Obesity, unspecified: Secondary | ICD-10-CM | POA: Diagnosis present

## 2016-05-12 ENCOUNTER — Telehealth: Payer: Self-pay | Admitting: Pulmonary Disease

## 2016-05-12 DIAGNOSIS — G4710 Hypersomnia, unspecified: Secondary | ICD-10-CM | POA: Diagnosis not present

## 2016-05-12 DIAGNOSIS — G4734 Idiopathic sleep related nonobstructive alveolar hypoventilation: Secondary | ICD-10-CM

## 2016-05-12 NOTE — Telephone Encounter (Signed)
   sheena :  pls tell pt the sleep study was (-) for OSA. It did show that patient needed 1 L O2 at HS.  Can you pls order O2 1L at HS? Does she need f/u after O2 start?   Thanks!  J. Shirl Harris, MD 05/12/2016, 1:08 PM [facility] Pager [phone] After 3 pm or if no answer, call [phone]

## 2016-05-12 NOTE — Procedures (Signed)
NAME: Marilynn Ekstein DATE OF BIRTH:  Oct 25, 1955 MEDICAL RECORD NUMBER [id number]  LOCATION: [facility]  PHYSICIAN: [place] OF STUDY: 05/11/2016  SLEEP STUDY TYPE: Out of Center Sleep Test                REFERRING PHYSICIAN: de Wiota, Mike Gip,*   Patient Name: Elanore, Talcott Date: 05/11/2016   Gender: Female  D.O.B: 1956/04/23  Age (years): 60  Referring Provider: [place]   Height (inches): 63  Interpreting Physician: [place]   Weight (lbs): 224  RPSGT: Baxter Flattery   BMI: 40  MRN: [id number]  Neck Size: 16.00     CLINICAL INFORMATION  Sleep Study Type: NPSG  Indication for sleep study: Fatigue, Obesity, OSA, Snoring, Witnessed Apneas  Epworth Sleepiness Score: 11   SLEEP STUDY TECHNIQUE  As per the AASM Manual for the Scoring of Sleep and Associated Events v2.3 (April 2016) with a hypopnea requiring 4% desaturations.  The channels recorded and monitored were frontal, central and occipital EEG, electrooculogram (EOG), submentalis EMG (chin), nasal and oral airflow, thoracic and abdominal wall motion, anterior tibialis EMG, snore microphone, electrocardiogram, and pulse oximetry.   MEDICATIONS  Patient's medications include: CYCLOBENZAPRINE HCL. Medications reviewed per chart review done. Medications self-administered by patient during sleep study : No sleep medicine administered.  SLEEP ARCHITECTURE  The study was initiated at 10:08:26 PM and ended at 5:01:44 AM.  Sleep onset time was 97.0 minutes and the sleep efficiency was 61.1%. The total sleep time was 252.3 minutes.  Stage REM latency was N/A minutes.  The patient spent 6.74% of the night in stage N1 sleep, 93.26% in stage N2 sleep, 0.00% in stage N3 and 0.00% in REM.  Alpha intrusion was absent.  Supine sleep was 37.85%.   RESPIRATORY PARAMETERS  The overall apnea/hypopnea index (AHI) was 2.1 per hour. There were 5 total apneas,  including 3 obstructive, 2 central and 0 mixed apneas. There were 4 hypopneas and 22 RERAs.  The AHI during Stage REM sleep was N/A per hour. AHI while supine was 1.3 per hour.  The mean oxygen saturation was 91.52%. The minimum SpO2 during sleep was 84.00%.  Loud snoring was noted during this study.  CARDIAC DATA  The 2 lead EKG demonstrated sinus rhythm. The mean heart rate was 83.78 beats per minute. Other EKG findings include: None.   LEG MOVEMENT DATA  The total PLMS were 57 with a resulting PLMS index of 13.55. Associated arousal with leg movement index was 1.9 .  IMPRESSIONS  No significant obstructive sleep apnea occurred during this study (AHI = 2.1/h). No significant central sleep apnea occurred during this study (CAI = 0.5/h). Patient had non apneic hypoxemia during this study, improved with 1L O2. The patient snored with Loud snoring volume. No cardiac abnormalities were noted during this study. Mild periodic limb movements of sleep occurred during the study. No significant associated arousals.  DIAGNOSIS  Non apneic Nocturnal Hypoxemia (327.26 [G47.36 ICD-10]), likely related to underlying asthma and OHS.  RECOMMENDATIONS  No evidence for significant OSA based on this study. Start O2 at 1L at HS. Avoid alcohol, sedatives and other CNS depressants that may worsen sleep apnea and disrupt normal sleep architecture. Sleep hygiene should be reviewed to assess factors that may improve sleep quality. Weight management and regular exercise should be initiated or continued if appropriate. Follow up in the office as scheduled.  Monica Becton, MD 05/12/2016, 1:03 PM [facility] Pager [phone] After 3 pm or if no answer, call [phone]

## 2016-05-12 NOTE — Telephone Encounter (Signed)
Spoke with pt and gave results and recommendations. Order placed for new O2. Nothing further needed.

## 2016-05-13 ENCOUNTER — Encounter: Payer: Self-pay | Admitting: Internal Medicine

## 2016-05-13 DIAGNOSIS — G4733 Obstructive sleep apnea (adult) (pediatric): Secondary | ICD-10-CM | POA: Diagnosis not present

## 2016-05-13 DIAGNOSIS — G4734 Idiopathic sleep related nonobstructive alveolar hypoventilation: Secondary | ICD-10-CM | POA: Insufficient documentation

## 2016-05-13 DIAGNOSIS — J189 Pneumonia, unspecified organism: Secondary | ICD-10-CM | POA: Diagnosis not present

## 2016-05-18 ENCOUNTER — Telehealth: Payer: Self-pay | Admitting: Cardiovascular Disease

## 2016-05-18 ENCOUNTER — Encounter (HOSPITAL_COMMUNITY)
Admission: RE | Admit: 2016-05-18 | Discharge: 2016-05-18 | Disposition: A | Discharge status: Home / Self Care | Payer: Medicare Other | Source: Ambulatory Visit | Attending: Pulmonary Disease | Admitting: Pulmonary Disease

## 2016-05-18 VITALS — BP 168/113 | HR 88 | Resp 18 | Ht 64.0 in | Wt 232.6 lb

## 2016-05-18 DIAGNOSIS — J4540 Moderate persistent asthma, uncomplicated: Secondary | ICD-10-CM

## 2016-05-18 NOTE — Progress Notes (Signed)
Shannon [name] presented to her pulmonary rehab orientation appointment denying complaints. She was weighed and her height was taken. Lungs are clear. 3 plus pitting edema noted to ankles and lower legs.  She was shown around the gym and hand hygiene was reviewed. Her vitals were assessed and as follows: HR 88, SaO2 96 on RA, BP R 195/111, L 189/107, after 5 min sitting R 168/113. Patient stated she has not taken her medications today. "I was running behind". Spoke with Ovid Curd at Dr. Blenda Mounts office. No new orders received. Office confirmed that patient has a follow up appointment on Friday at 11:15. Patient was unaware of this appointment. Patient was to also follow up with the hypertension clinic 2 weeks ago and failed to do so. Patient will not be allowed to begin pulmonary rehab until her hypertension is under control. Dr. Blenda Mounts office agrees according to patient. No new orders received. Ovid Curd agrees patient is stable for discharge. Denies complaints. Patient did state that she has had double vision on several occasion over the past several weeks. Discussed side effects of uncontrolled hypertension with patient at length. Also discussed emergency s/s of hypertension crisis. Low NA diet handouts given. Patient verbalized understanding. Her referral for pulmonary rehab will be placed on medical hold until patient's htn is better controlled. Will follow up with patient in a couple of weeks. Patient transported to [facility] parking to be transported home via [place]. Patient instructed to take her medications as prescribed once she returns home.

## 2016-05-18 NOTE — Telephone Encounter (Signed)
Trish Fountain calling from [facility]  Patient of Dr. Oval Linsey referred to [facility] by Dr. Corrie Dandy  Notes hx of poorly controlled HTN Today BP RA 195/111 LA 189/107  gave patient opportunity to rest 168/113 RA Patient reports she did not take BP meds this AM  Notes she won't be allowed to exercise w these resting pressures - will be dropped from pulm rehab if pressures not controlled.  She is sending home w/ instruction to take prescribed AM meds Aware patient was supposed to follow up after last appt for HTN clinic appt - this appt never scheduled. Sees Dr. Oval Linsey on Fri. I advised OK to send patient home, reinforce instructions on BP meds.  Will route for MD's awareness.

## 2016-05-22 ENCOUNTER — Ambulatory Visit: Payer: Medicare Other | Admitting: Cardiovascular Disease

## 2016-05-27 ENCOUNTER — Encounter: Payer: Self-pay | Admitting: Cardiovascular Disease

## 2016-05-27 ENCOUNTER — Ambulatory Visit (INDEPENDENT_AMBULATORY_CARE_PROVIDER_SITE_OTHER): Payer: Medicare Other | Admitting: Cardiovascular Disease

## 2016-05-27 VITALS — BP 157/99 | HR 110 | Ht 63.6 in | Wt 232.8 lb

## 2016-05-27 DIAGNOSIS — I10 Essential (primary) hypertension: Secondary | ICD-10-CM

## 2016-05-27 DIAGNOSIS — I350 Nonrheumatic aortic (valve) stenosis: Secondary | ICD-10-CM

## 2016-05-27 DIAGNOSIS — I351 Nonrheumatic aortic (valve) insufficiency: Secondary | ICD-10-CM

## 2016-05-27 DIAGNOSIS — R0602 Shortness of breath: Secondary | ICD-10-CM

## 2016-05-27 MED ORDER — EZETIMIBE 10 MG PO TABS: ORAL_TABLET | ORAL | 5 refills | Status: DC

## 2016-05-27 MED ORDER — CARVEDILOL 12.5 MG PO TABS: 12.5000 mg | ORAL_TABLET | Freq: Two times a day (BID) | ORAL | 1 refills | Status: DC

## 2016-05-27 NOTE — Patient Instructions (Signed)
Medication Instructions:  START CARVEDILOL 12.5 MG TWICE A DAY  Labwork: NONE  Testing/Procedures: NONE  Follow-Up: Your physician recommends that you schedule a follow-up appointment in: [place]   Your physician recommends that you schedule a follow-up appointment in: [facility]  If you need a refill on your cardiac medications before your next appointment, please call your pharmacy.

## 2016-05-27 NOTE — Progress Notes (Signed)
Cardiology Office Note   Date:  05/27/2016   ID:  Shannon [name], DOB [birth year], MRN [id number]  PCP:  Lucious Groves, DO  Cardiologist:   Skeet Latch, MD  ID: [name], MD  Chief Complaint  Patient presents with  . Follow-up    1 month     History of Present Illness: Shannon [name] is a 60 y.o. female with hypertension, asthma, non-obstructive CAD, OSA, prior PE and HIV (well-controlled)  who presents for follow up on shortness of breath.  [name] saw her infectious disease doctor, [name], MD, on 12/26/15. [name] was concerned about her lower extremity edema and shortness of breath.  She was started on lasix 20 mg every other day.  [name] noted shortness of breath since her hospitalization for pneumonia in November.   She denied lower extremity edema or orthopnea but did report chest pain when laying down at night.  She was referred for an echo 01/2016 that revealed LVEF 60-65% awith grade 1 diastolic dysfunction and hypokinesis of the basal inferior wall.  She also had very mild aortic stenosis with a mean gradient of 9 mmHg and trivial AR.  PASP was 52 mmHg.  She had a Lexiscan Myoview at that time that was negative for ischemia.  [name] previously had a Lexiscan Cardiolite in September 2013 that revealed a medium, mild reversible defect in the anterior wall and normal systolic function. She subsequently underwent cardiac catheterization that revealed nonobstructive coronary disease. She had mild pulmonary hypertension and a normal cardiac output. Left ventriculography revealed mild to moderate aortic regurgitation. She had an echo May 2016 that revealed an ejection fraction of 35% and mild diastolic dysfunction. It also revealed calcification of the aortic valve, mild to moderate aortic regurgitation, and mild aortic stenosis. Peak velocity was 2.1 m/s. She had a 24-hour Holter in February 2015 that revealed rare PACs and PVCs.  At her last appointment [name]'s  blood pressure was poorly-controlled (182/110) so nifedipine was increased to 60 mg.  She was reluctant to increase it any further.  She was referred to hypertension clinic but this appointment but did not make the appointment.  She has been participating in pulmonary rehab.  One morning her BP was 195/111.   However she had not taken her blood pressure medication that day.  She was not allowed to participate and had a good intervention by one of the nurses who reinforced the importance of blood pressure control.  She brings a log of her readings at home that have all been >140/90.  She did not tolerate HCTZ due to dizziness.  Last week she had an episode of chest pain while sitting at work.  Her blood pressure was 009 systolic at the time. She has also noted episodes of double vision that has since resolved.     Past Medical History:  Diagnosis Date  . Anemia   . Anxiety   . Arthritis    "starting to; in my hands" (07/09/2015)  . Chronic asthma with acute exacerbation    "I have chronic asthma all the time; sometimes exacerbations" (07/09/2015)  . Chronic lower back pain   . Cyst of right kidney    "3 of them; dx'd in ~ 01/2015"  . GERD (gastroesophageal reflux disease)   . Heart murmur   . History of blood transfusion    "related to my brain surgery I think"  . HIV antibody positive (Shannon [name])   . HIV disease (Shannon [name])   .  Hyperlipidemia   . Hypertension   . Pneumonia 07/09/2015  . Sleep apnea    "never completed part 2 of study; never wore mask" (07/09/2015)    Past Surgical History:  Procedure Laterality Date  . ABDOMINAL HYSTERECTOMY     "robotic laparosopic"  . BRAIN SURGERY  1974   "brain tumor; benign; on top of my brain; got a plate in there"  . CARDIAC CATHETERIZATION    . TONSILLECTOMY AND ADENOIDECTOMY       Current Outpatient Prescriptions  Medication Sig Dispense Refill  . acetaminophen (TYLENOL) 500 MG tablet Take 500 mg by mouth every 6 (six) hours as needed for mild  pain.    [name] albuterol (PROAIR HFA) 108 (90 Base) MCG/ACT inhaler Inhale 2 puffs into the lungs every 6 (six) hours as needed for wheezing or shortness of breath. 6.7 g 3  . albuterol (PROVENTIL) (2.5 MG/3ML) 0.083% nebulizer solution Take 3 mLs (2.5 mg total) by nebulization every 6 (six) hours as needed for wheezing or shortness of breath. 75 mL 3  . Biotin 1000 MCG tablet Take 1,000 mcg by mouth daily. Takes every 2-3 days     . Coenzyme Q10 (COQ10 PO) Take 1 tablet by mouth daily.    . cyanocobalamin (,VITAMIN B-12,) 1000 MCG/ML injection Inject 1 mL (1,000 mcg total) into the muscle every 30 (thirty) days. 1 mL 3  . cyclobenzaprine (FLEXERIL) 5 MG tablet Take 1 tablet (5 mg total) by mouth 3 (three) times daily as needed for muscle spasms. 30 tablet 0  . dextromethorphan-guaiFENesin (MUCINEX DM) 30-600 MG 12hr tablet Take 1 tablet by mouth 2 (two) times daily. 14 tablet 0  . dolutegravir (TIVICAY) 50 MG tablet Take 1 tablet (50 mg total) by mouth daily. 30 tablet 11  . emtricitabine-tenofovir AF (DESCOVY) 200-25 MG tablet Take 1 tablet by mouth daily. 30 tablet 11  . ENSURE (ENSURE) Take 237 mLs by mouth 2 (two) times daily between meals. (Patient taking differently: Take 237 mLs by mouth See admin instructions. Drink 237 ml every 2 days) 237 mL 11  . ezetimibe (ZETIA) 10 MG tablet Take 10 mg by mouth daily 30 tablet 5  . fluticasone (FLONASE) 50 MCG/ACT nasal spray Place 2 sprays into both nostrils daily. (Patient taking differently: Place 2 sprays into both nostrils daily as needed for allergies. ) 16 g 5  . furosemide (LASIX) 20 MG tablet Take 1 tablet (20 mg total) by mouth every other day. (Patient taking differently: Take 20 mg by mouth daily as needed for fluid. ) 45 tablet 1  . L-Lysine 500 MG CAPS Take 1 capsule by mouth once a week.    . loratadine (CLARITIN) 10 MG tablet Take 1 tablet (10 mg total) by mouth daily as needed for allergies. 30 tablet 2  . losartan (COZAAR) 100 MG tablet  Take 100 mg by mouth daily.    . methocarbamol (ROBAXIN) 500 MG tablet Take 1 tablet (500 mg total) by mouth 4 (four) times daily. 60 tablet 0  . Multiple Vitamin (MULTIVITAMIN WITH MINERALS) TABS tablet Take 1 tablet by mouth daily. (Patient taking differently: Take 1 tablet by mouth See admin instructions. Take 1 tablet by mouth every 2 days) 90 tablet   . Multiple Vitamins-Minerals (ZINC) LOZG Take 1 lozenge by mouth daily. 100 lozenge 0  . NIFEdipine (PROCARDIA XL/ADALAT-CC) 60 MG 24 hr tablet Take 1 tablet (60 mg total) by mouth daily. 30 tablet 11  . POTASSIUM PO Take 1 tablet by mouth  daily.    [name] PREMARIN vaginal cream Place 1 application vaginally once a week.    . SYRINGE/NEEDLE, DISP, 1 ML (B-D SYRINGE/NEEDLE 1CC/25GX5/8) 25G X 5/8" 1 ML MISC 1 Units by Does not apply route every 30 (thirty) days. 50 each 0  . Tetrahydrozoline HCl (VISINE OP) Place 2 drops into both eyes as needed (for dry eyes).    [name] tiotropium (SPIRIVA HANDIHALER) 18 MCG inhalation capsule Place 1 capsule (18 mcg total) into inhaler and inhale daily. 30 capsule 3  . vitamin C (ASCORBIC ACID) 500 MG tablet Take 2 tablets (1,000 mg total) by mouth daily. (Patient taking differently: Take 500 mg by mouth daily. ) 180 tablet 0  . carvedilol (COREG) 12.5 MG tablet Take 1 tablet (12.5 mg total) by mouth 2 (two) times daily. 60 tablet 1   No current facility-administered medications for this visit.    Facility-Administered Medications Ordered in Other Visits  Medication Dose Route Frequency Provider Last Rate Last Dose  . technetium tetrofosmin (TC-MYOVIEW) injection 29.9 milli Curie  16.1 millicurie Intravenous Once PRN Dorothy Spark, MD        Allergies:   Tree extract and Ciprofloxacin    Social History:  The patient  reports that she has never smoked. She has never used smokeless tobacco. She reports that she drinks alcohol. She reports that she does not use drugs.   Family History:  The patient's family history  includes Asthma in her mother; Diabetes in her sister; Heart failure in her mother; Heart murmur in her brother, sister, and sister; Thyroid disease in her sister.    ROS:  Please see the history of present illness.   Otherwise, review of systems are positive for none.   All other systems are reviewed and negative.    PHYSICAL EXAM: VS:  BP (!) 157/99   Pulse (!) 110   Ht 5' 3.6" (1.615 m)   Wt 232 lb 12.8 oz (105.6 kg)   BMI 40.46 kg/m  , BMI Body mass index is 40.46 kg/m. GENERAL:  Well appearing HEENT:  Pupils equal round and reactive, fundi not visualized, oral mucosa unremarkable NECK:  No jugular venous distention, waveform within normal limits, carotid upstroke brisk and symmetric, no bruits LYMPHATICS:  No cervical adenopathy LUNGS:  Clear to auscultation bilaterally HEART:  RRR.  PMI not displaced or sustained,S1 and S2 within normal limits, no S3, no S4, no clicks, no rubs, II/VI early-peaking systolic murmur at the right upper sternal border. I/IV diastolic murmur at the left upper sternal border.  ABD:  Flat, positive bowel sounds normal in frequency in pitch, no bruits, no rebound, no guarding, no midline pulsatile mass, no hepatomegaly, no splenomegaly EXT:  2 plus pulses throughout, no edema, no cyanosis no clubbing SKIN:  No rashes no nodules NEURO:  Cranial nerves II through XII grossly intact, motor grossly intact throughout PSYCH:  Cognitively intact, oriented to person place and time   EKG:  EKG is ordered today. The ekg ordered today desinus rhythm. Rate 89 bpm. 2 PVCs.  Coronary angiography 06/01/12:10-20% OM, diffuse 20-30% LAD, 10-20% D1, 20% diffuse RCA RA 10, RV 39/12, RV EDP 12, capillary wedge pressure 19, PA 39/21, mean 27, LV 116/14, LVEDP 20, aorta 115/77 Cardiac output (Fick) 6.55, cardiac index 3.2 PVR 1.7 with units, SVR 12 with units. She P/QRS ratio 1 LVEF 80%. Aortic root mildly dilated. 1-2+ aortic regurgitation.  24-hour Holter 09/26/13: Sinus  rhythm, rare PACs, rare PVCs  Echo 01/22/15: LVEF 70%.  Mild LVH. Mild diastolic dysfunction. Normal RV function. Left atrium moderately enlarged. Right atrium mildly enlarged. Trace mitral regurgitation. Calcific aortic valve. Mild to moderate aortic regurgitation. Mild aortic stenosis. Peak velocity 2.1 m/s trace tricuspid regurgitation    Lexiscan Myoview 01/2016:   The left ventricular ejection fraction is normal (55-65%). The EF is 60% visually. The computer generated EF is not calculated correctly and is therefore not reported.  There was no ST segment deviation noted during stress.  The study is normal. no ischemia . no infarction   Echo 01/29/16: Study Conclusions  - Left ventricle: The cavity size was normal. Wall thickness was   increased in a pattern of moderate LVH. Systolic function was   normal. The estimated ejection fraction was in the range of 60%   to 65%. Basal inferior hypokinesis. Doppler parameters are   consistent with abnormal left ventricular relaxation (grade 1   diastolic dysfunction). - Aortic valve: Trileaflet; moderately calcified leaflets. There   was very mild stenosis. There was mild regurgitation. Mean   gradient (S): 9 mm Hg. Valve area (VTI): 1.68 cm^2. - Mitral valve: There was trivial regurgitation. - Left atrium: The atrium was mildly dilated. - Right ventricle: The cavity size was normal. Systolic function   was normal. - Tricuspid valve: Peak RV-RA gradient (S): 44 mm Hg. - Pulmonary arteries: PA peak pressure: 52 mm Hg (S). - Systemic veins: IVC measured 2.3 cm with > 50% respirophasic   variation, suggesting RA pressure 8 mmHg.  Impressions:  - Normal LV size with moderate LV hypertrophy. EF 60-65%. Basal   inferior hypokinesis. Normal RV size and systolic function. Very   mild aortic stenosis, mild aortic insufficieny. Moderate   pulmonary hypertension.   Recent Labs: 08/08/2015: TSH 0.690 11/28/2015: Brain Natriuretic Peptide  54.9 03/31/2016: ALT 15 04/13/2016: BUN 17; Creatinine, Ser 1.40; Hemoglobin 13.2; Platelets 191; Potassium 3.9; Sodium 141    Lipid Panel    Component Value Date/Time   CHOL 174 11/15/2015 0952   TRIG 75 11/15/2015 0952   HDL 60 11/15/2015 0952   CHOLHDL 2.9 11/15/2015 0952   VLDL 15 11/15/2015 0952   LDLCALC 99 11/15/2015 0952      Wt Readings from Last 3 Encounters:  05/27/16 232 lb 12.8 oz (105.6 kg)  05/18/16 232 lb 9.4 oz (105.5 kg)  05/11/16 224 lb (101.6 kg)      ASSESSMENT AND PLAN:  # Hypertension:  BP remains poorly-controlled.  We discussed the importance of taking her medication as scheduled and reducing salt intake.  We will start carvedilol 12.5 mg bid.  Continue losartan and nifedipine.   # Shortness of breath: Stress test was normal and echo revealed normal systolic function with grade 1 diastolic dysfunction.  Her poorly-controlled blood pressure is likely contributing as well as central adiposity.  Continue weight loss efforts once BP is better-controlled.   # Aortic regurgitation # Aortic stenosis: Ms. Hamidi has very mild aortic stenosis (mean gradient 9 mmHg) and mild aortic regurgitation.  Continue to monitor.   Current medicines are reviewed at length with the patient today.  The patient does not have concerns regarding medicines.  The following changes have been made:  no change  Time spent: 30 minutes-Greater than 50% of this time was spent in counseling, explanation of diagnosis, planning of further management, and coordination of care.   Labs/ tests ordered today include:   No orders of the defined types were placed in this encounter.    Disposition:  FU with Octavion Mollenkopf C. Oval Linsey, MD, [facility] in 1 month.  Follow up in hypertension clinic in 2 weeks.     This note was written with the assistance of speech recognition software.  Please excuse any transcriptional errors.  Signed, Wally Behan C. Oval Linsey, MD, [facility]  05/27/2016 8:59 AM    [facility]

## 2016-05-29 ENCOUNTER — Other Ambulatory Visit: Payer: Self-pay | Admitting: Internal Medicine

## 2016-06-03 DIAGNOSIS — I10 Essential (primary) hypertension: Secondary | ICD-10-CM | POA: Diagnosis not present

## 2016-06-03 DIAGNOSIS — M542 Cervicalgia: Secondary | ICD-10-CM | POA: Diagnosis not present

## 2016-06-09 ENCOUNTER — Telehealth: Payer: Self-pay | Admitting: Internal Medicine

## 2016-06-09 NOTE — Telephone Encounter (Signed)
PT. REMINDER CALL, PHONE NOT IN SERVICE

## 2016-06-10 ENCOUNTER — Ambulatory Visit (INDEPENDENT_AMBULATORY_CARE_PROVIDER_SITE_OTHER): Payer: Medicare Other | Admitting: Internal Medicine

## 2016-06-10 VITALS — BP 160/100 | HR 88 | Temp 97.7°F | Ht 63.5 in | Wt 235.7 lb

## 2016-06-10 DIAGNOSIS — Z79899 Other long term (current) drug therapy: Secondary | ICD-10-CM

## 2016-06-10 DIAGNOSIS — B9689 Other specified bacterial agents as the cause of diseases classified elsewhere: Secondary | ICD-10-CM

## 2016-06-10 DIAGNOSIS — I10 Essential (primary) hypertension: Secondary | ICD-10-CM

## 2016-06-10 DIAGNOSIS — J019 Acute sinusitis, unspecified: Secondary | ICD-10-CM | POA: Diagnosis not present

## 2016-06-10 DIAGNOSIS — J45909 Unspecified asthma, uncomplicated: Secondary | ICD-10-CM

## 2016-06-10 DIAGNOSIS — J0100 Acute maxillary sinusitis, unspecified: Secondary | ICD-10-CM

## 2016-06-10 DIAGNOSIS — J329 Chronic sinusitis, unspecified: Secondary | ICD-10-CM | POA: Diagnosis not present

## 2016-06-10 DIAGNOSIS — Z21 Asymptomatic human immunodeficiency virus [HIV] infection status: Secondary | ICD-10-CM

## 2016-06-10 DIAGNOSIS — M67431 Ganglion, right wrist: Secondary | ICD-10-CM

## 2016-06-10 DIAGNOSIS — R2231 Localized swelling, mass and lump, right upper limb: Secondary | ICD-10-CM

## 2016-06-10 DIAGNOSIS — Q828 Other specified congenital malformations of skin: Secondary | ICD-10-CM

## 2016-06-10 MED ORDER — GUAIFENESIN ER 600 MG PO TB12: 600.0000 mg | ORAL_TABLET | Freq: Two times a day (BID) | ORAL | 0 refills | Status: DC

## 2016-06-10 MED ORDER — AMOXICILLIN 500 MG PO TABS: 500.0000 mg | ORAL_TABLET | Freq: Two times a day (BID) | ORAL | 0 refills | Status: DC

## 2016-06-10 MED ORDER — LORATADINE 10 MG PO TABS: 10.0000 mg | ORAL_TABLET | Freq: Every day | ORAL | 2 refills | Status: DC | PRN

## 2016-06-10 NOTE — Patient Instructions (Addendum)
Take regular claritin once a day to help with the congestion Get saline nasal spray and flonase nasal spray; these will help with the nasal congestion and calm down the inflammation  -use saline 2-3 times a day  -use flonase once a day  Please take Guafenesin (regular mucinex) twice a day to break up mucous Please take azithromycin '500mg'$  (one tab) twice a day for 5 days  I will send in for a referral for your hand

## 2016-06-10 NOTE — Assessment & Plan Note (Signed)
Patient with skin tags across cheeks for which she would like referral to dermatology.  Plan --Referred to dermatology

## 2016-06-10 NOTE — Assessment & Plan Note (Signed)
Symptoms and exam consistent for ganglionic cyst dorsum right wrist.   Plan --Will refer to hand surgery for management

## 2016-06-10 NOTE — Progress Notes (Signed)
   CC: Congestion  HPI:  [name] is a 60 y.o. with a PMH listed below presenting with 2 days of congestion. Patient states congestion is not associated with fever, cough, sore throat, headache. It is associated with hoarse voice, facial pressure. She denies sick contacts. Patient states she is only taken her asthma inhalers nebulizers and has not tried other symptomatic treatment.  Please see problem based Assessment and Plan for status of patients chronic conditions.  Past Medical History:  Diagnosis Date  . Anemia   . Anxiety   . Arthritis    "starting to; in my hands" (07/09/2015)  . Chronic asthma with acute exacerbation    "I have chronic asthma all the time; sometimes exacerbations" (07/09/2015)  . Chronic lower back pain   . Cyst of right kidney    "3 of them; dx'd in ~ 01/2015"  . GERD (gastroesophageal reflux disease)   . Heart murmur   . History of blood transfusion    "related to my brain surgery I think"  . HIV antibody positive ([place])   . HIV disease ([place])   . Hyperlipidemia   . Hypertension   . Pneumonia 07/09/2015  . Sleep apnea    "never completed part 2 of study; never wore mask" (07/09/2015)    Review of Systems:   Review of Systems  Constitutional: Negative for chills and fever.  HENT: Negative for hearing loss.   Eyes: Negative for blurred vision.  Respiratory: Negative for cough, hemoptysis, sputum production, shortness of breath and wheezing.   Cardiovascular: Negative for chest pain and leg swelling.  Gastrointestinal: Negative for abdominal pain.  Musculoskeletal: Negative for myalgias.  Skin: Negative for rash.  Neurological: Negative for headaches.    Physical Exam:  Vitals:   06/10/16 1009 06/10/16 1011  BP: (!) 171/108 (!) 160/100  Pulse: 88 88  Temp: 97.7 F (36.5 C)   TempSrc: Oral   SpO2: 97%   Weight: 235 lb 11.2 oz (106.9 kg)   Height: 5' 3.5" (1.613 m)    Physical Exam  Constitutional: Mildly anxious appearing ENT:  Multiple skin tags across cheeks, evidence of mucus and oropharynx but no erythema or exudates, no lymphadenopathy, severe tenderness bilaterally over maxillary sinuses CV: Regular rate and rhythm: No murmurs, rubs, or gallops appreciated. 2+ bilateral pitting lower extremity edema. Pulses intact throughout. Respiratory: Clear to auscultation bilaterally, no increased work of breathing, no wheezing or crackles appreciated. Abdomen: Soft, obese, positive bowel sounds, nondistended and nontender Musculoskeletal: Cystic mass over dorsum of right wrist, nontender to touch, pain with index finger and wrist flexion, mild decrease in extent of right index finger flexion   Assessment & Plan:   See Encounters Tab for problem based charting.   Patient seen with Dr. Verdie Drown, MD Internal Medicine PGY1

## 2016-06-10 NOTE — Assessment & Plan Note (Signed)
Patient with uncontrolled hypertension. She is being followed by Dr. Oval Linsey in hypertension clinic. Her medications were recently adjusted. She is to be taking carvedilol 12.5 mg twice a day, losartan 100 mg daily, nifedipine 60 mg daily. Patient states she took her morning blood pressure medicines today. Initial blood pressure in clinic is 171/108. Patient was very anxious about wait and medical problems. Repeat blood pressure is 160/100. Patient was asymptomatic.  Plan -No medication changes at this time -Advised patient on medication compliance and monitoring her blood pressure -Patient has blood pressure check appointment with Dr. Blenda Mounts clinic next week on the 24th; advised patient to keep appointment -Patient has follow-up appointment with Dr. Oval Linsey herself November 7

## 2016-06-10 NOTE — Assessment & Plan Note (Signed)
Patient with signs and symptoms of acute sinusitis likely of bacterial origin due to extreme tenderness over maxillary sinuses.  Plan -Amoxicillin 500 mg twice a day for 5 days -Advised using Netty pot for sinus irrigation -begin cetirizine 10 mg daily -Begin saline nasal spray and fluticasone nasal spray for nasal congestion

## 2016-06-11 NOTE — Progress Notes (Signed)
Medicine attending: I personally interviewed and briefly examined this patient on the day of the patient visit and reviewed pertinent clinical ,laboratory, and radiographic data  with resident physician Dr. Jari Favre and we discussed a management plan. HIV positive woman with asthma, acute on chronic sinusitis, HTN, upset because she had to wait 1.5 hours to be seen. She was also upset because she has seen a different MD every time she comes her. We tried to address her concerns and she was satisfied by the end of the visit.

## 2016-06-12 DIAGNOSIS — G4733 Obstructive sleep apnea (adult) (pediatric): Secondary | ICD-10-CM | POA: Diagnosis not present

## 2016-06-12 DIAGNOSIS — J189 Pneumonia, unspecified organism: Secondary | ICD-10-CM | POA: Diagnosis not present

## 2016-06-16 ENCOUNTER — Ambulatory Visit: Payer: Medicare Other

## 2016-06-22 ENCOUNTER — Telehealth: Payer: Self-pay | Admitting: Cardiovascular Disease

## 2016-06-22 NOTE — Telephone Encounter (Signed)
Ok to switch to 1:00pm

## 2016-06-22 NOTE — Telephone Encounter (Signed)
I'm not sure about coverage/slot availability. This would need review by pharmD to see if schedule request can be accomodated.

## 2016-06-22 NOTE — Telephone Encounter (Signed)
Change made, patient notified and voiced thanks and acknowledgment.

## 2016-06-22 NOTE — Telephone Encounter (Signed)
Patient has an appointment on 06/25/16 for a BP check at 8am and wants to know if she can come in at 1-1:30 instead.  I did not see any other availability on that day.

## 2016-06-23 DIAGNOSIS — M67431 Ganglion, right wrist: Secondary | ICD-10-CM | POA: Diagnosis not present

## 2016-06-25 ENCOUNTER — Ambulatory Visit: Payer: Medicare Other

## 2016-06-25 NOTE — Progress Notes (Deleted)
Patient ID: Shannon [name]                 DOB: 1956/06/17                      MRN: [id number]     HPI: Shannon [name] is a 60 y.o. female patient of Dr. Oval Linsey with [place] below who presents today for hypertension evaluation.   Cardiac Hx:  HTN, asthma, OSA,   Current HTN meds:  Carvedilol 12.'5mg'$  BID Furosemide '20mg'$  PRN Losartan '100mg'$  daily Nifedipine '60mg'$  daily  Previously tried:  HCTZ - dizziness  BP goal: <150/90  Family History:   Social History:   Diet:   Exercise:   Home BP readings:   Wt Readings from Last 3 Encounters:  06/10/16 235 lb 11.2 oz (106.9 kg)  05/27/16 232 lb 12.8 oz (105.6 kg)  05/18/16 232 lb 9.4 oz (105.5 kg)   BP Readings from Last 3 Encounters:  06/10/16 (!) 160/100  05/27/16 (!) 157/99  05/18/16 (!) 168/113   Pulse Readings from Last 3 Encounters:  06/10/16 88  05/27/16 (!) 110  05/18/16 88    Renal function: CrCl cannot be calculated (Unknown ideal weight.).  Past Medical History:  Diagnosis Date  . Anemia   . Anxiety   . Arthritis    "starting to; in my hands" (07/09/2015)  . Chronic asthma with acute exacerbation    "I have chronic asthma all the time; sometimes exacerbations" (07/09/2015)  . Chronic lower back pain   . Cyst of right kidney    "3 of them; dx'd in ~ 01/2015"  . GERD (gastroesophageal reflux disease)   . Heart murmur   . History of blood transfusion    "related to my brain surgery I think"  . HIV antibody positive ([place])   . HIV disease ([place])   . Hyperlipidemia   . Hypertension   . Pneumonia 07/09/2015  . Sleep apnea    "never completed part 2 of study; never wore mask" (07/09/2015)    Current Outpatient Prescriptions on File Prior to Visit  Medication Sig Dispense Refill  . acetaminophen (TYLENOL) 500 MG tablet Take 500 mg by mouth every 6 (six) hours as needed for mild pain.    [name] albuterol (PROAIR HFA) 108 (90 Base) MCG/ACT inhaler Inhale 2 puffs into the lungs every 6 (six) hours as needed for  wheezing or shortness of breath. 6.7 g 3  . albuterol (PROVENTIL) (2.5 MG/3ML) 0.083% nebulizer solution Take 3 mLs (2.5 mg total) by nebulization every 6 (six) hours as needed for wheezing or shortness of breath. 75 mL 3  . amoxicillin (AMOXIL) 500 MG tablet Take 1 tablet (500 mg total) by mouth 2 (two) times daily. 10 tablet 0  . Biotin 1000 MCG tablet Take 1,000 mcg by mouth daily. Takes every 2-3 days     . carvedilol (COREG) 12.5 MG tablet Take 1 tablet (12.5 mg total) by mouth 2 (two) times daily. 60 tablet 1  . Coenzyme Q10 (COQ10 PO) Take 1 tablet by mouth daily.    . cyanocobalamin (,VITAMIN B-12,) 1000 MCG/ML injection Inject 1 mL (1,000 mcg total) into the muscle every 30 (thirty) days. 1 mL 3  . cyclobenzaprine (FLEXERIL) 5 MG tablet TAKE 1 TABLET(5 MG) BY MOUTH THREE TIMES DAILY AS NEEDED FOR MUSCLE SPASMS 30 tablet 0  . dolutegravir (TIVICAY) 50 MG tablet Take 1 tablet (50 mg total) by mouth daily. 30 tablet 11  . emtricitabine-tenofovir AF (DESCOVY) 200-25  MG tablet Take 1 tablet by mouth daily. 30 tablet 11  . ENSURE (ENSURE) Take 237 mLs by mouth 2 (two) times daily between meals. (Patient taking differently: Take 237 mLs by mouth See admin instructions. Drink 237 ml every 2 days) 237 mL 11  . ezetimibe (ZETIA) 10 MG tablet Take 10 mg by mouth daily 30 tablet 5  . fluticasone (FLONASE) 50 MCG/ACT nasal spray Place 2 sprays into both nostrils daily. (Patient taking differently: Place 2 sprays into both nostrils daily as needed for allergies. ) 16 g 5  . furosemide (LASIX) 20 MG tablet Take 1 tablet (20 mg total) by mouth every other day. (Patient taking differently: Take 20 mg by mouth daily as needed for fluid. ) 45 tablet 1  . guaiFENesin (MUCINEX) 600 MG 12 hr tablet Take 1 tablet (600 mg total) by mouth 2 (two) times daily. 60 tablet 0  . L-Lysine 500 MG CAPS Take 1 capsule by mouth once a week.    . loratadine (CLARITIN) 10 MG tablet Take 1 tablet (10 mg total) by mouth daily as  needed for allergies. 30 tablet 2  . losartan (COZAAR) 100 MG tablet Take 100 mg by mouth daily.    . methocarbamol (ROBAXIN) 500 MG tablet Take 1 tablet (500 mg total) by mouth 4 (four) times daily. 60 tablet 0  . Multiple Vitamin (MULTIVITAMIN WITH MINERALS) TABS tablet Take 1 tablet by mouth daily. (Patient taking differently: Take 1 tablet by mouth See admin instructions. Take 1 tablet by mouth every 2 days) 90 tablet   . Multiple Vitamins-Minerals (ZINC) LOZG Take 1 lozenge by mouth daily. 100 lozenge 0  . NIFEdipine (PROCARDIA XL/ADALAT-CC) 60 MG 24 hr tablet Take 1 tablet (60 mg total) by mouth daily. 30 tablet 11  . POTASSIUM PO Take 1 tablet by mouth daily.    [name] PREMARIN vaginal cream Place 1 application vaginally once a week.    . SYRINGE/NEEDLE, DISP, 1 ML (B-D SYRINGE/NEEDLE 1CC/25GX5/8) 25G X 5/8" 1 ML MISC 1 Units by Does not apply route every 30 (thirty) days. 50 each 0  . Tetrahydrozoline HCl (VISINE OP) Place 2 drops into both eyes as needed (for dry eyes).    [name] tiotropium (SPIRIVA HANDIHALER) 18 MCG inhalation capsule Place 1 capsule (18 mcg total) into inhaler and inhale daily. 30 capsule 3  . vitamin C (ASCORBIC ACID) 500 MG tablet Take 2 tablets (1,000 mg total) by mouth daily. (Patient taking differently: Take 500 mg by mouth daily. ) 180 tablet 0   Current Facility-Administered Medications on File Prior to Visit  Medication Dose Route Frequency Provider Last Rate Last Dose  . technetium tetrofosmin (TC-MYOVIEW) injection 29.9 milli Curie  01.7 millicurie Intravenous Once PRN Dorothy Spark, MD        Allergies  Allergen Reactions  . Tree Extract Swelling and Other (See Comments)    Swelling to eyes  . Ciprofloxacin Hives    There were no vitals taken for this visit.   Assessment/Plan: Hypertension:    Thank you, Lelan Pons. Patterson Hammersmith, [facility]  06/25/2016 12:37 PM

## 2016-06-29 ENCOUNTER — Other Ambulatory Visit: Payer: Self-pay | Admitting: Pharmacist

## 2016-06-29 IMAGING — DX DG CHEST 2V
2 series · 2 of 2 positions shown · non-contrast
Comparison: 07/09/2015

CLINICAL DATA: Community-acquired pneumonia

EXAM:
CHEST  2 VIEW

[w chest pa]
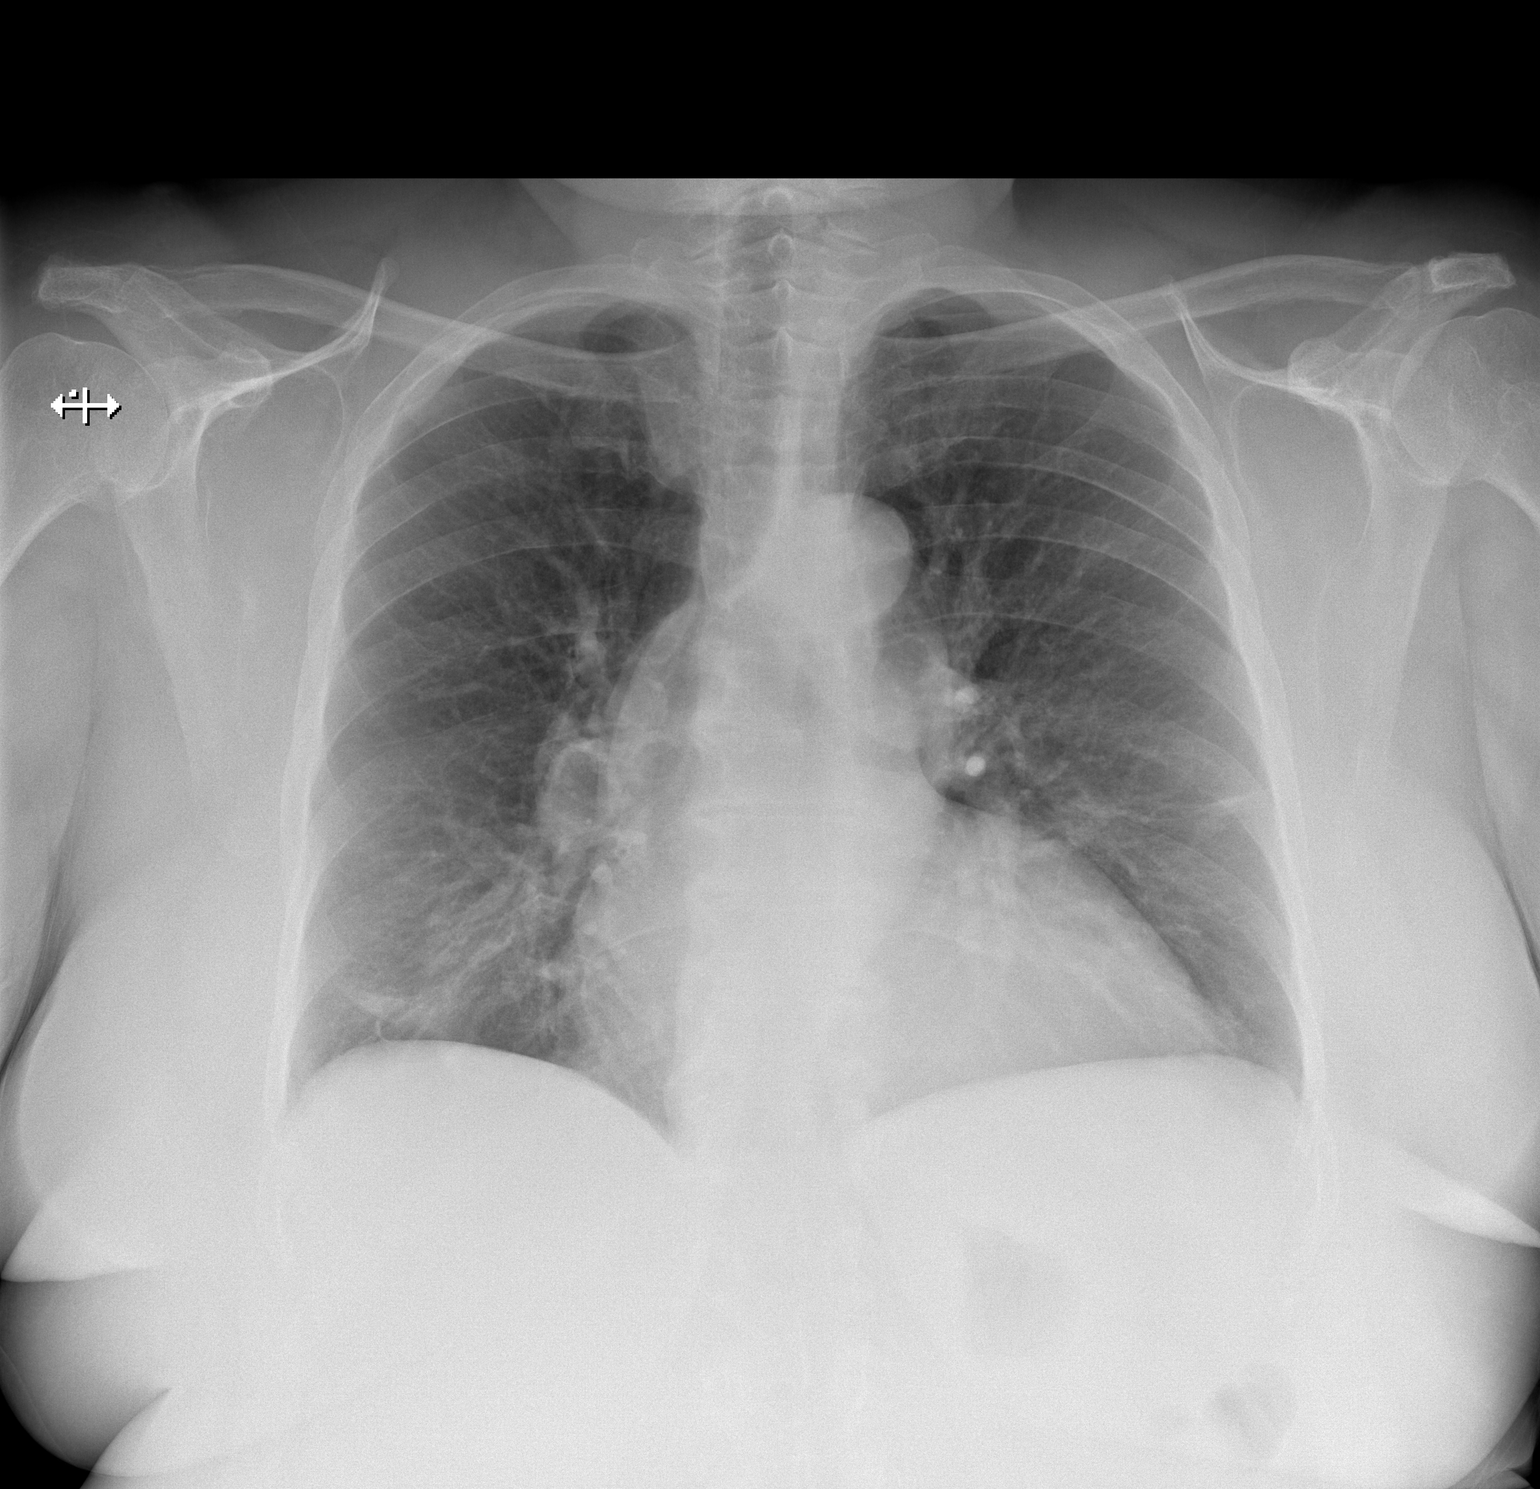

[w chest lat]
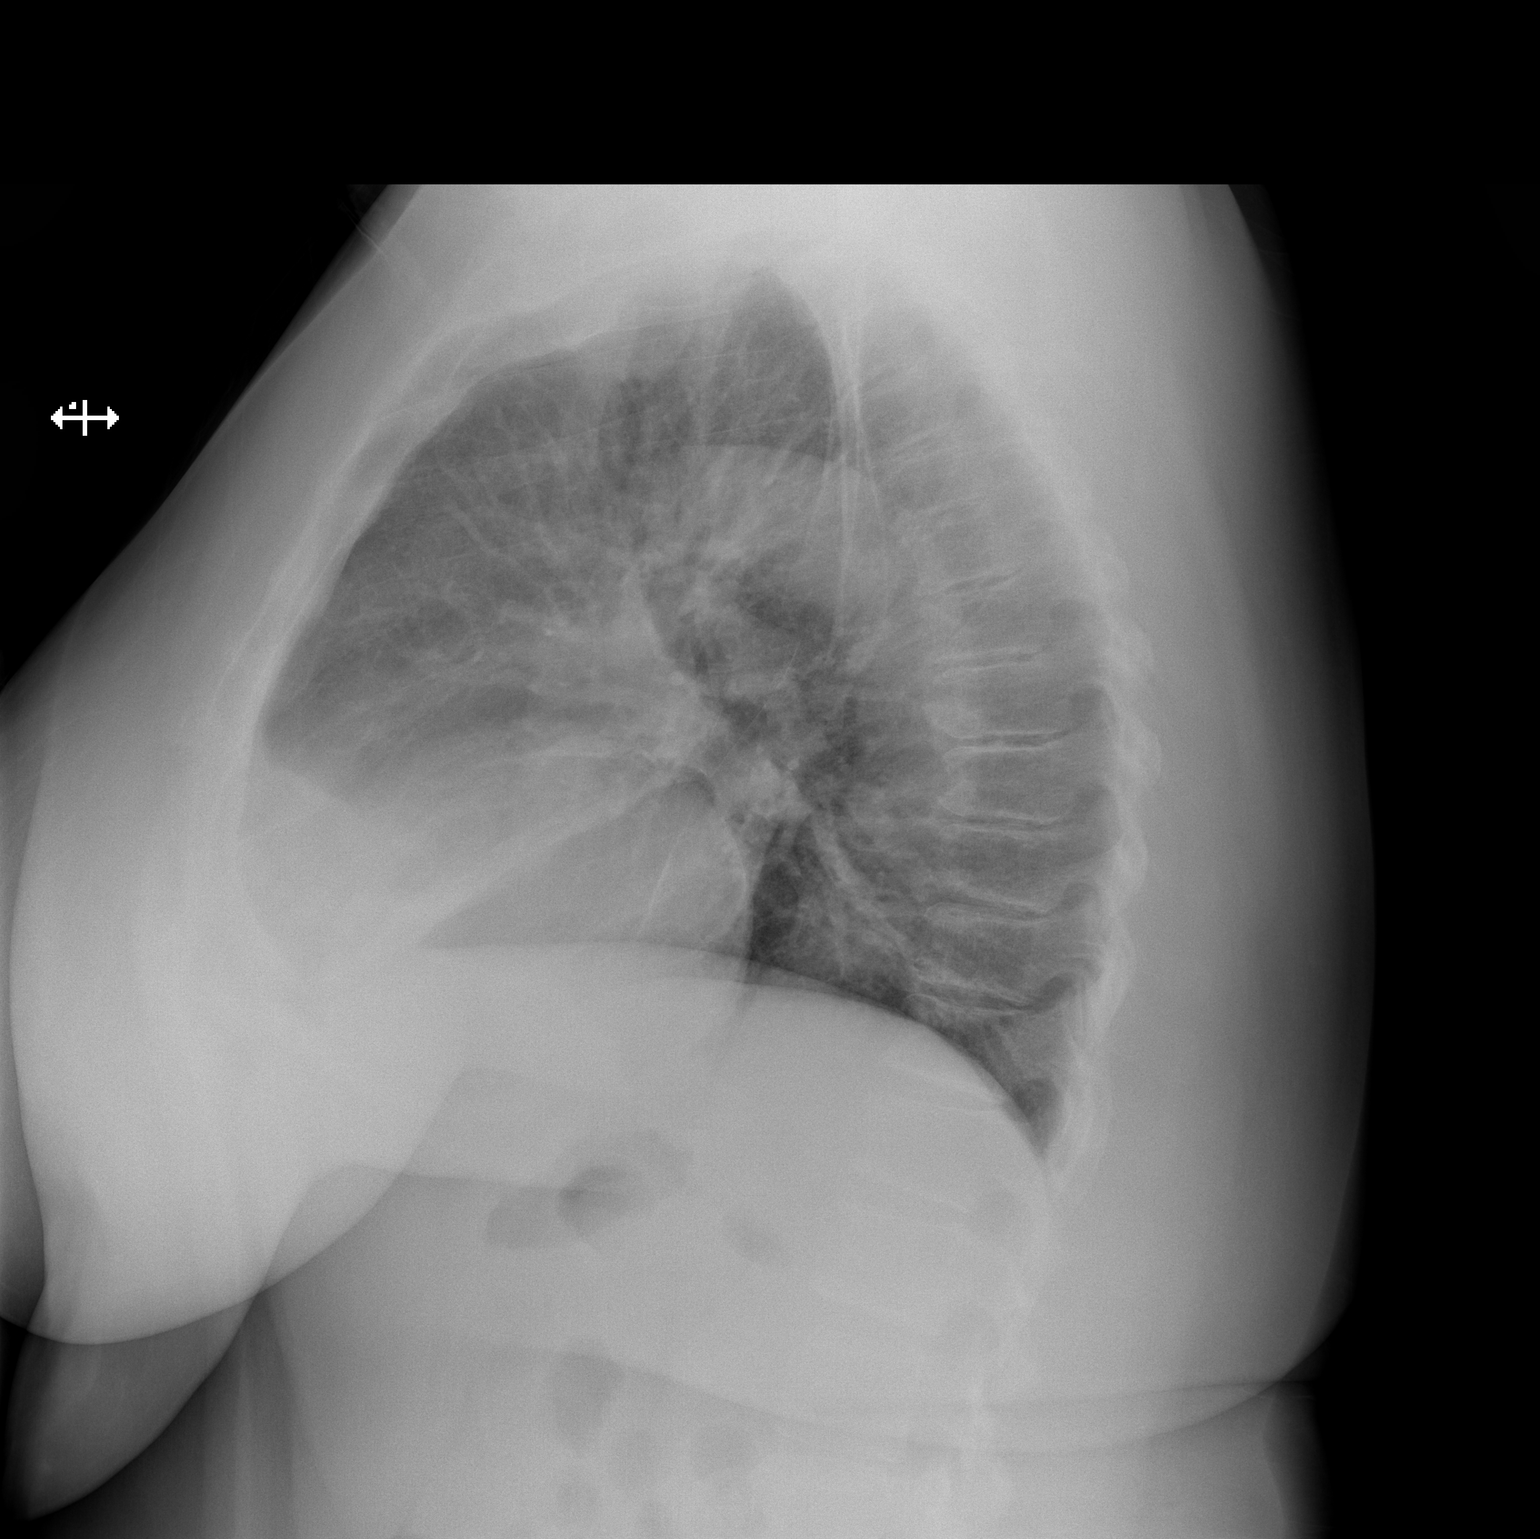

[2 of 2 positions shown; findings below may reference images not displayed]

FINDINGS: Right middle lobe airspace disease shows partial clearing. This may
be atelectasis or pneumonia. Mild lingular linear density unchanged
possibly scarring or atelectasis.

Mild cardiac enlargement. Negative for heart failure or edema. No
pleural effusion.
IMPRESSION: Partial clearing of right middle lobe atelectasis/infiltrate. No
other new findings.

## 2016-06-29 NOTE — Progress Notes (Deleted)
Cardiology Office Note   Date:  06/29/2016   ID:  Shannon [name], DOB 1956-01-23, MRN [id number]  PCP:  Lucious Groves, DO  Cardiologist:   Skeet Latch, MD  ID: Shannon Basques, MD  No chief complaint on file.    History of Present Illness: Shannon [name] is a 61 y.o. female with hypertension, asthma, non-obstructive CAD, OSA, prior PE and HIV (well-controlled)  who presents for follow up on shortness of breath.  Shannon [name] saw her infectious disease doctor, Shannon Basques, MD, on 12/26/15. Shannon [name] was concerned about her lower extremity edema and shortness of breath.  She was started on lasix 20 mg every other day.  Shannon [name] noted shortness of breath since her hospitalization for pneumonia in November.   She denied lower extremity edema or orthopnea but did report chest pain when laying down at night.  She was referred for an echo 01/2016 that revealed LVEF 60-65% awith grade 1 diastolic dysfunction and hypokinesis of the basal inferior wall.  She also had very mild aortic stenosis with a mean gradient of 9 mmHg and trivial AR.  PASP was 52 mmHg.  She had a Lexiscan Myoview at that time that was negative for ischemia.  Shannon [name] previously had a Lexiscan Cardiolite in September 2013 that revealed a medium, mild reversible defect in the anterior wall and normal systolic function. She subsequently underwent cardiac catheterization that revealed nonobstructive coronary disease. She had mild pulmonary hypertension and a normal cardiac output. Left ventriculography revealed mild to moderate aortic regurgitation. She had an echo May 2016 that revealed an ejection fraction of 75% and mild diastolic dysfunction. It also revealed calcification of the aortic valve, mild to moderate aortic regurgitation, and mild aortic stenosis. Peak velocity was 2.1 m/s. She had a 24-hour Holter in February 2015 that revealed rare PACs and PVCs.  At her last appointment Shannon [name]'s blood pressure was poorly-controlled  (182/110) so nifedipine was increased to 60 mg.  She was reluctant to increase it any further.  She was referred to hypertension clinic but this appointment but did not make the appointment.  She has been participating in pulmonary rehab.  One morning her BP was 195/111.   However she had not taken her blood pressure medication that day.  She was not allowed to participate and had a good intervention by one of the nurses who reinforced the importance of blood pressure control.  She brings a log of her readings at home that have all been >140/90.  She did not tolerate HCTZ due to dizziness.  Last week she had an episode of chest pain while sitting at work.  Her blood pressure was 643 systolic at the time. She has also noted episodes of double vision that has since resolved.        At her last appointment Shannon [name] was started on carvedilol due to poorly-controlled hypertension. Shannon [name] was seen in the resident clinic 10/18 and her BP was 171/108.  No changes were made.  She was to follow up with our pharmacist 10/24.   Past Medical History:  Diagnosis Date  . Anemia   . Anxiety   . Arthritis    "starting to; in my hands" (07/09/2015)  . Chronic asthma with acute exacerbation    "I have chronic asthma all the time; sometimes exacerbations" (07/09/2015)  . Chronic lower back pain   . Cyst of right kidney    "3 of them; dx'd in ~ 01/2015"  . GERD (  gastroesophageal reflux disease)   . Heart murmur   . History of blood transfusion    "related to my brain surgery I think"  . HIV antibody positive ([place])   . HIV disease ([place])   . Hyperlipidemia   . Hypertension   . Pneumonia 07/09/2015  . Sleep apnea    "never completed part 2 of study; never wore mask" (07/09/2015)    Past Surgical History:  Procedure Laterality Date  . ABDOMINAL HYSTERECTOMY     "robotic laparosopic"  . BRAIN SURGERY  1974   "brain tumor; benign; on top of my brain; got a plate in there"  . CARDIAC CATHETERIZATION      . TONSILLECTOMY AND ADENOIDECTOMY       Current Outpatient Prescriptions  Medication Sig Dispense Refill  . acetaminophen (TYLENOL) 500 MG tablet Take 500 mg by mouth every 6 (six) hours as needed for mild pain.    [name] albuterol (PROAIR HFA) 108 (90 Base) MCG/ACT inhaler Inhale 2 puffs into the lungs every 6 (six) hours as needed for wheezing or shortness of breath. 6.7 g 3  . albuterol (PROVENTIL) (2.5 MG/3ML) 0.083% nebulizer solution Take 3 mLs (2.5 mg total) by nebulization every 6 (six) hours as needed for wheezing or shortness of breath. 75 mL 3  . amoxicillin (AMOXIL) 500 MG tablet Take 1 tablet (500 mg total) by mouth 2 (two) times daily. 10 tablet 0  . Biotin 1000 MCG tablet Take 1,000 mcg by mouth daily. Takes every 2-3 days     . carvedilol (COREG) 12.5 MG tablet Take 1 tablet (12.5 mg total) by mouth 2 (two) times daily. 60 tablet 1  . Coenzyme Q10 (COQ10 PO) Take 1 tablet by mouth daily.    . cyanocobalamin (,VITAMIN B-12,) 1000 MCG/ML injection Inject 1 mL (1,000 mcg total) into the muscle every 30 (thirty) days. 1 mL 3  . cyclobenzaprine (FLEXERIL) 5 MG tablet TAKE 1 TABLET(5 MG) BY MOUTH THREE TIMES DAILY AS NEEDED FOR MUSCLE SPASMS 30 tablet 0  . dolutegravir (TIVICAY) 50 MG tablet Take 1 tablet (50 mg total) by mouth daily. 30 tablet 11  . emtricitabine-tenofovir AF (DESCOVY) 200-25 MG tablet Take 1 tablet by mouth daily. 30 tablet 11  . ENSURE (ENSURE) Take 237 mLs by mouth 2 (two) times daily between meals. (Patient taking differently: Take 237 mLs by mouth See admin instructions. Drink 237 ml every 2 days) 237 mL 11  . ezetimibe (ZETIA) 10 MG tablet Take 10 mg by mouth daily 30 tablet 5  . fluticasone (FLONASE) 50 MCG/ACT nasal spray Place 2 sprays into both nostrils daily. (Patient taking differently: Place 2 sprays into both nostrils daily as needed for allergies. ) 16 g 5  . furosemide (LASIX) 20 MG tablet Take 1 tablet (20 mg total) by mouth every other day. (Patient  taking differently: Take 20 mg by mouth daily as needed for fluid. ) 45 tablet 1  . guaiFENesin (MUCINEX) 600 MG 12 hr tablet Take 1 tablet (600 mg total) by mouth 2 (two) times daily. 60 tablet 0  . L-Lysine 500 MG CAPS Take 1 capsule by mouth once a week.    . loratadine (CLARITIN) 10 MG tablet Take 1 tablet (10 mg total) by mouth daily as needed for allergies. 30 tablet 2  . losartan (COZAAR) 100 MG tablet Take 100 mg by mouth daily.    . methocarbamol (ROBAXIN) 500 MG tablet Take 1 tablet (500 mg total) by mouth 4 (four) times daily. [place]  tablet 0  . Multiple Vitamin (MULTIVITAMIN WITH MINERALS) TABS tablet Take 1 tablet by mouth daily. (Patient taking differently: Take 1 tablet by mouth See admin instructions. Take 1 tablet by mouth every 2 days) 90 tablet   . Multiple Vitamins-Minerals (ZINC) LOZG Take 1 lozenge by mouth daily. 100 lozenge 0  . NIFEdipine (PROCARDIA XL/ADALAT-CC) 60 MG 24 hr tablet Take 1 tablet (60 mg total) by mouth daily. 30 tablet 11  . POTASSIUM PO Take 1 tablet by mouth daily.    [name] PREMARIN vaginal cream Place 1 application vaginally once a week.    . SYRINGE/NEEDLE, DISP, 1 ML (B-D SYRINGE/NEEDLE 1CC/25GX5/8) 25G X 5/8" 1 ML MISC 1 Units by Does not apply route every 30 (thirty) days. 50 each 0  . Tetrahydrozoline HCl (VISINE OP) Place 2 drops into both eyes as needed (for dry eyes).    [name] tiotropium (SPIRIVA HANDIHALER) 18 MCG inhalation capsule Place 1 capsule (18 mcg total) into inhaler and inhale daily. 30 capsule 3  . vitamin C (ASCORBIC ACID) 500 MG tablet Take 2 tablets (1,000 mg total) by mouth daily. (Patient taking differently: Take 500 mg by mouth daily. ) 180 tablet 0   No current facility-administered medications for this visit.    Facility-Administered Medications Ordered in Other Visits  Medication Dose Route Frequency Provider Last Rate Last Dose  . technetium tetrofosmin (TC-MYOVIEW) injection 29.9 milli Curie  24.4 millicurie Intravenous Once PRN  Dorothy Spark, MD        Allergies:   Tree extract and Ciprofloxacin    Social History:  The patient  reports that she has never smoked. She has never used smokeless tobacco. She reports that she drinks alcohol. She reports that she does not use drugs.   Family History:  The patient's family history includes Asthma in her mother; Diabetes in her sister; Heart failure in her mother; Heart murmur in her brother, sister, and sister; Thyroid disease in her sister.    ROS:  Please see the history of present illness.   Otherwise, review of systems are positive for none.   All other systems are reviewed and negative.    PHYSICAL EXAM: VS:  There were no vitals taken for this visit. , BMI There is no height or weight on file to calculate BMI. GENERAL:  Well appearing HEENT:  Pupils equal round and reactive, fundi not visualized, oral mucosa unremarkable NECK:  No jugular venous distention, waveform within normal limits, carotid upstroke brisk and symmetric, no bruits LYMPHATICS:  No cervical adenopathy LUNGS:  Clear to auscultation bilaterally HEART:  RRR.  PMI not displaced or sustained,S1 and S2 within normal limits, no S3, no S4, no clicks, no rubs, II/VI early-peaking systolic murmur at the right upper sternal border. I/IV diastolic murmur at the left upper sternal border.  ABD:  Flat, positive bowel sounds normal in frequency in pitch, no bruits, no rebound, no guarding, no midline pulsatile mass, no hepatomegaly, no splenomegaly EXT:  2 plus pulses throughout, no edema, no cyanosis no clubbing SKIN:  No rashes no nodules NEURO:  Cranial nerves II through XII grossly intact, motor grossly intact throughout PSYCH:  Cognitively intact, oriented to person place and time   EKG:  EKG is ordered today. The ekg ordered today desinus rhythm. Rate 89 bpm. 2 PVCs.  Coronary angiography 06/01/12:10-20% OM, diffuse 20-30% LAD, 10-20% D1, 20% diffuse RCA RA 10, RV 39/12, RV EDP 12, capillary  wedge pressure 19, PA 39/21, mean 27, LV 116/14, LVEDP  20, aorta 115/77 Cardiac output (Fick) 6.55, cardiac index 3.2 PVR 1.7 with units, SVR 12 with units. She P/QRS ratio 1 LVEF 80%. Aortic root mildly dilated. 1-2+ aortic regurgitation.  24-hour Holter 09/26/13: Sinus rhythm, rare PACs, rare PVCs  Echo 01/22/15: LVEF 70%. Mild LVH. Mild diastolic dysfunction. Normal RV function. Left atrium moderately enlarged. Right atrium mildly enlarged. Trace mitral regurgitation. Calcific aortic valve. Mild to moderate aortic regurgitation. Mild aortic stenosis. Peak velocity 2.1 m/s trace tricuspid regurgitation    Lexiscan Myoview 01/2016:   The left ventricular ejection fraction is normal (55-65%). The EF is 60% visually. The computer generated EF is not calculated correctly and is therefore not reported.  There was no ST segment deviation noted during stress.  The study is normal. no ischemia . no infarction   Echo 01/29/16: Study Conclusions  - Left ventricle: The cavity size was normal. Wall thickness was   increased in a pattern of moderate LVH. Systolic function was   normal. The estimated ejection fraction was in the range of 60%   to 65%. Basal inferior hypokinesis. Doppler parameters are   consistent with abnormal left ventricular relaxation (grade 1   diastolic dysfunction). - Aortic valve: Trileaflet; moderately calcified leaflets. There   was very mild stenosis. There was mild regurgitation. Mean   gradient (S): 9 mm Hg. Valve area (VTI): 1.68 cm^2. - Mitral valve: There was trivial regurgitation. - Left atrium: The atrium was mildly dilated. - Right ventricle: The cavity size was normal. Systolic function   was normal. - Tricuspid valve: Peak RV-RA gradient (S): 44 mm Hg. - Pulmonary arteries: PA peak pressure: 52 mm Hg (S). - Systemic veins: IVC measured 2.3 cm with > 50% respirophasic   variation, suggesting RA pressure 8 mmHg.  Impressions:  - Normal LV size with  moderate LV hypertrophy. EF 60-65%. Basal   inferior hypokinesis. Normal RV size and systolic function. Very   mild aortic stenosis, mild aortic insufficieny. Moderate   pulmonary hypertension.   Recent Labs: 08/08/2015: TSH 0.690 11/28/2015: Brain Natriuretic Peptide 54.9 03/31/2016: ALT 15 04/13/2016: BUN 17; Creatinine, Ser 1.40; Hemoglobin 13.2; Platelets 191; Potassium 3.9; Sodium 141    Lipid Panel    Component Value Date/Time   CHOL 174 11/15/2015 0952   TRIG 75 11/15/2015 0952   HDL 60 11/15/2015 0952   CHOLHDL 2.9 11/15/2015 0952   VLDL 15 11/15/2015 0952   LDLCALC 99 11/15/2015 0952      Wt Readings from Last 3 Encounters:  06/10/16 106.9 kg (235 lb 11.2 oz)  05/27/16 105.6 kg (232 lb 12.8 oz)  05/18/16 105.5 kg (232 lb 9.4 oz)      ASSESSMENT AND PLAN:  # Hypertension:  BP remains poorly-controlled.  We discussed the importance of taking her medication as scheduled and reducing salt intake.  We will start carvedilol 12.5 mg bid.  Continue losartan and nifedipine.   # Shortness of breath: Stress test was normal and echo revealed normal systolic function with grade 1 diastolic dysfunction.  Her poorly-controlled blood pressure is likely contributing as well as central adiposity.  Continue weight loss efforts once BP is better-controlled.   # Aortic regurgitation # Aortic stenosis: Ms. Buttermore has very mild aortic stenosis (mean gradient 9 mmHg) and mild aortic regurgitation.  Continue to monitor.   Current medicines are reviewed at length with the patient today.  The patient does not have concerns regarding medicines.  The following changes have been made:  no change  Time spent:  30 minutes-Greater than 50% of this time was spent in counseling, explanation of diagnosis, planning of further management, and coordination of care.   Labs/ tests ordered today include:   No orders of the defined types were placed in this encounter.    Disposition:   FU with Beverley Sherrard  C. Oval Linsey, MD, [facility] in 1 month.  Follow up in hypertension clinic in 2 weeks.     This note was written with the assistance of speech recognition software.  Please excuse any transcriptional errors.  Signed, Saffron Busey C. Oval Linsey, MD, [facility]  06/29/2016 10:40 PM    [facility]

## 2016-06-30 ENCOUNTER — Ambulatory Visit: Payer: Medicare Other | Admitting: Cardiovascular Disease

## 2016-07-07 DIAGNOSIS — L818 Other specified disorders of pigmentation: Secondary | ICD-10-CM | POA: Diagnosis not present

## 2016-07-07 DIAGNOSIS — L918 Other hypertrophic disorders of the skin: Secondary | ICD-10-CM | POA: Diagnosis not present

## 2016-07-09 ENCOUNTER — Ambulatory Visit (INDEPENDENT_AMBULATORY_CARE_PROVIDER_SITE_OTHER): Payer: Medicare Other | Admitting: Pulmonary Disease

## 2016-07-09 ENCOUNTER — Ambulatory Visit: Payer: Medicare Other | Admitting: Pulmonary Disease

## 2016-07-09 DIAGNOSIS — J0100 Acute maxillary sinusitis, unspecified: Secondary | ICD-10-CM

## 2016-07-09 DIAGNOSIS — G4734 Idiopathic sleep related nonobstructive alveolar hypoventilation: Secondary | ICD-10-CM

## 2016-07-09 DIAGNOSIS — K219 Gastro-esophageal reflux disease without esophagitis: Secondary | ICD-10-CM

## 2016-07-09 DIAGNOSIS — B20 Human immunodeficiency virus [HIV] disease: Secondary | ICD-10-CM

## 2016-07-09 DIAGNOSIS — R059 Cough, unspecified: Secondary | ICD-10-CM

## 2016-07-09 DIAGNOSIS — G4733 Obstructive sleep apnea (adult) (pediatric): Secondary | ICD-10-CM

## 2016-07-09 DIAGNOSIS — J4540 Moderate persistent asthma, uncomplicated: Secondary | ICD-10-CM

## 2016-07-09 DIAGNOSIS — M62838 Other muscle spasm: Secondary | ICD-10-CM | POA: Insufficient documentation

## 2016-07-09 DIAGNOSIS — R05 Cough: Secondary | ICD-10-CM

## 2016-07-09 MED ORDER — CYCLOBENZAPRINE HCL 5 MG PO TABS
5.0000 mg | ORAL_TABLET | Freq: Three times a day (TID) | ORAL | 0 refills | Status: DC
Start: 2016-07-09 — End: 2016-09-18

## 2016-07-09 MED ORDER — ALBUTEROL SULFATE HFA 108 (90 BASE) MCG/ACT IN AERS: 2.0000 | INHALATION_SPRAY | RESPIRATORY_TRACT | 3 refills | Status: DC | PRN

## 2016-07-09 MED ORDER — PREDNISONE 20 MG PO TABS: 20.0000 mg | ORAL_TABLET | Freq: Every day | ORAL | 0 refills | Status: DC

## 2016-07-09 MED ORDER — AMOXICILLIN 500 MG PO TABS: 500.0000 mg | ORAL_TABLET | Freq: Two times a day (BID) | ORAL | 0 refills | Status: DC

## 2016-07-09 NOTE — Assessment & Plan Note (Signed)
Non-apneic Nocturnal hypoxemia diagnosed by sleep study 05/11/16 improved with 1L supplemental O2 via [place]. Likely secondary to underlying Asthma/ COPD 

## 2016-07-09 NOTE — Patient Instructions (Signed)
It was a pleasure taking care of you today!  You are diagnosed with Asthma.   Asthma is a chronic disease that affects the airways of your lungs.When you have asthma, your airways become swollen. The swelling causes the airways to make thick, sticky secretions called mucus.   Asthma also causes the muscles in and around your airways to get very tight.  This swelling, mucus, and tight muscles can make your airways narrower than normal and it becomes very hard for you to get air into and out of your lungs.   Sometimes, when you have a lung infection, this can make your breathing worse, and will cause you to  have an asthma flare-up. Please call your primary care doctor or the office if you are having an asthma flare-up.   Smoking makes asthma worse.   We will order your pro-air. We will order amoxicillin 500 mg per tablet, 1 tablet twice a day for 7 days. We will order prednisone as well and take it as discussed..   Return to clinic in 3-4 months.

## 2016-07-09 NOTE — Assessment & Plan Note (Signed)
Will Rx flexeril for only 1 week as her PCP is unavailable and she is going to [facility].

## 2016-07-09 NOTE — Assessment & Plan Note (Signed)
Currently follows with ID.

## 2016-07-09 NOTE — Assessment & Plan Note (Addendum)
Patient is a nonsmoker. Dxed with asthma when she was in her 77s. Triggers: extreme change in weather. Her asthma has beed unstable . She moved from [place] to [place] in Oct 2016.  PFT (12/04/15)  FEV1/FVC 50%,  FEV1  0.64  31%. Some BD response. DLCO 66%. Pt stopped symbicort since she felt her heart was racing. She states she is better with alb prn. She also does not like prednisone.  Spiriva dd not help much. She has issues with MDIs.   Over all, she feels better over all with just alb prn.  Uses alb neb or MDI 1-2x/week. Asthma has been stable.   Plan : 1. Cont alb neb or MDI q4hrs prn. She did not like Pulmicort. Told her to give [place] a call if she ends up using albuterol more frequently. May need to revisit Pulmicort nebulizer. 2. She is happy with her asthma. Recent mild flare 2/2 bronchitis. She wants amoxicillin, 500 mg, twice a day for 1 week. Plan for prednisone taper if not better. Told her to call the office if not better. 3. Does not want vaccines.

## 2016-07-09 NOTE — Assessment & Plan Note (Signed)
PPI at HS.  

## 2016-07-09 NOTE — Assessment & Plan Note (Addendum)
Has UACS. cont flonase.

## 2016-07-09 NOTE — Progress Notes (Signed)
Subjective:    Patient ID: Shannon [name], female    DOB: [birth year], 60 y.o.   MRN: [id number]  HPI Pt is being seen for asthma, OSA, exertional dyspnea.   DATA PFT (12/04/15)  FEV1/FVC 50%,  FEV1  0.64  31%. Some BD response. DLCO 66%.   ROV (12/04/15) Pt returns to office after being seen in 09/2015 for hypersomnia, asthma. Has been sick with "PNA" since last seen. Stopped symbicort 2/2 heart racing.  Still with cough and congestion. She feels better with proair > uses it 3x/week. Also has anxiety attacks. Has not been admitted since last seen. HIV meds have been changed. Took bactrim and augmentin for cough/congestion.   ROV (01/02/16) Pt returns to clinic as f/u on her OSA, asthma.   Since last seen, she states she remains SOB and hoarse. Uses spiriva. She thinks she retains fluid. Saw heart MD. No recent admission. Received zpak in 11/2015. Issues using MDIs.   ROV 07/09/16 Patient follows up in the office for several issues. Since last seen, she had a sleep lab study done 04/2016 which was negative for sleep apnea but proved she needed 1 L of oxygen at bedtime. She was also seen by Tammy couple months ago. She mentioned the Pulmicort did not necessarily make a difference. She only uses albuterol when necessary and that has helped her with her asthma. She tries to avoid triggers. Recent cough related to weather change. Has not been admitted nor been on antibiotics since last seen. Feels better overall.  Review of Systems  Constitutional: Negative.   HENT: Positive for congestion and postnasal drip.   Eyes: Negative.   Respiratory: Positive for cough and shortness of breath.   Cardiovascular: Negative.   Gastrointestinal: Negative.   Endocrine: Negative.   Genitourinary: Negative.   Musculoskeletal: Negative.   Allergic/Immunologic: Negative.   Neurological: Negative.   Hematological: Negative.   Psychiatric/Behavioral: Negative.   All other systems reviewed and are negative.        Objective:   Physical Exam Vitals:  Vitals:   07/09/16 1435  BP: (!) 126/92  Pulse: (!) 104  SpO2: 92%  Weight: 237 lb 3.2 oz (107.6 kg)  Height: 5' 3.5" (1.613 m)    Constitutional/General:  Pleasant, well-nourished, well-developed, not in any distress,  Comfortably seating.  Well kempt  Body mass index is 41.36 kg/m. Wt Readings from Last 3 Encounters:  07/09/16 237 lb 3.2 oz (107.6 kg)  06/10/16 235 lb 11.2 oz (106.9 kg)  05/27/16 232 lb 12.8 oz (105.6 kg)      HEENT: Pupils equal and reactive to light and accommodation. Anicteric sclerae. Normal nasal mucosa.   No oral  lesions,  mouth clear,  oropharynx clear, no postnasal drip. (-) Oral thrush. No dental caries.  Airway - Mallampati class IV  Neck: No masses. Midline trachea. No JVD, (-) LAD. (-) bruits appreciated.  Respiratory/Chest: Grossly normal chest. (-) deformity. (-) Accessory muscle use.  Symmetric expansion. (-) Tenderness on palpation.  Resonant on percussion.  Diminished BS on both lower lung zones. (-)crackles, rhonchi. (-) wheezing.  (-) egophony  Cardiovascular: Regular rate and  rhythm, heart sounds normal, no murmur or gallops,(+) peripheral edema  Gastrointestinal:  Normal bowel sounds. Soft, non-tender. No hepatosplenomegaly.  (-) masses.   Musculoskeletal:  Normal muscle tone. Normal gait.   Extremities: Grossly normal. (-) clubbing, cyanosis.  (+)  edema  Skin: (-) rash,lesions seen.   Neurological/Psychiatric : alert, oriented to time, place, person. Normal mood and affect  Assessment & Plan:  Asthma, moderate persistent Patient is a nonsmoker. Dxed with asthma when she was in her 36s. Triggers: extreme change in weather. Her asthma has beed unstable . She moved from [place] to [place] in Oct 2016.  PFT (12/04/15)  FEV1/FVC 50%,  FEV1  0.64  31%. Some BD response. DLCO 66%. Pt stopped symbicort since she felt her heart was racing. She states she is better with alb  prn. She also does not like prednisone.  Spiriva dd not help much. She has issues with MDIs.   Over all, she feels better over all with just alb prn.  Uses alb neb or MDI 1-2x/week. Asthma has been stable.   Plan : 1. Cont alb neb or MDI q4hrs prn. She did not like Pulmicort. Told her to give [place] a call if she ends up using albuterol more frequently. May need to revisit Pulmicort nebulizer. 2. She is happy with her asthma. Recent mild flare 2/2 bronchitis. She wants amoxicillin, 500 mg, twice a day for 1 week. Plan for prednisone taper if not better. Told her to call the office if not better. 3. Does not want vaccines.   Nocturnal hypoxemia Non-apneic Nocturnal hypoxemia diagnosed by sleep study 05/11/16 improved with 1L supplemental O2 via [place]. Likely secondary to underlying Asthma/ COPD   OSA (obstructive sleep apnea) Patient was diagnosed with obstructive sleep apnea more than 10 years ago. She has hypersomnia, snoring, gasping, choking, fatigue. She had a sleep study done in [place]. She never had a CPAP  Study done. Her hypersomnia has gotten worse. Hypersomnia affects her functionality. -- Sleep study in 04/2016 was (-) for OSA. Needed o2 1L. Still with some hypersomnia. May need a HST or lab study in 2018. Need to rediscuss.  -- Advised on good sleep hygiene. -- Advised not to engage in activities requiring vigilance if she is sleepy.       GERD (gastroesophageal reflux disease) PPI at HS.   Cough Has UACS. cont flonase.   HIV disease ([place]) Currently follows with ID.   Muscle spasm Will Rx flexeril for only 1 week as her PCP is unavailable and she is going to [facility].      Return to clinic in 4 mos.   Monica Becton, MD 07/09/2016, 3:48 PM [facility] Pager [phone] After 3 pm or if no answer, call [phone]

## 2016-07-09 NOTE — Assessment & Plan Note (Addendum)
Patient was diagnosed with obstructive sleep apnea more than 10 years ago. She has hypersomnia, snoring, gasping, choking, fatigue. She had a sleep study done in [place]. She never had a CPAP  Study done. Her hypersomnia has gotten worse. Hypersomnia affects her functionality. -- Sleep study in 04/2016 was (-) for OSA. Needed o2 1L. Still with some hypersomnia. May need a HST or lab study in 2018. Need to rediscuss.  -- Advised on good sleep hygiene. -- Advised not to engage in activities requiring vigilance if she is sleepy.

## 2016-07-13 ENCOUNTER — Ambulatory Visit: Payer: Medicare Other | Admitting: Cardiovascular Disease

## 2016-07-13 DIAGNOSIS — G4733 Obstructive sleep apnea (adult) (pediatric): Secondary | ICD-10-CM | POA: Diagnosis not present

## 2016-07-13 DIAGNOSIS — J189 Pneumonia, unspecified organism: Secondary | ICD-10-CM | POA: Diagnosis not present

## 2016-07-23 ENCOUNTER — Ambulatory Visit (INDEPENDENT_AMBULATORY_CARE_PROVIDER_SITE_OTHER): Payer: Medicare Other | Admitting: Cardiovascular Disease

## 2016-07-23 ENCOUNTER — Ambulatory Visit: Payer: Medicare Other | Admitting: Internal Medicine

## 2016-07-23 ENCOUNTER — Encounter: Payer: Self-pay | Admitting: Cardiovascular Disease

## 2016-07-23 VITALS — BP 171/107 | HR 93 | Ht 63.5 in | Wt 236.2 lb

## 2016-07-23 DIAGNOSIS — R0602 Shortness of breath: Secondary | ICD-10-CM

## 2016-07-23 DIAGNOSIS — I10 Essential (primary) hypertension: Secondary | ICD-10-CM

## 2016-07-23 DIAGNOSIS — E661 Drug-induced obesity: Secondary | ICD-10-CM

## 2016-07-23 DIAGNOSIS — I351 Nonrheumatic aortic (valve) insufficiency: Secondary | ICD-10-CM

## 2016-07-23 DIAGNOSIS — I493 Ventricular premature depolarization: Secondary | ICD-10-CM

## 2016-07-23 MED ORDER — CARVEDILOL 25 MG PO TABS: 25.0000 mg | ORAL_TABLET | Freq: Two times a day (BID) | ORAL | 5 refills | Status: DC

## 2016-07-23 NOTE — Patient Instructions (Addendum)
Medication Instructions:  INCREASE YOUR CARVEDILOL TO 25 MG TWICE A DAY   TAKE LASIX (FUROSEMIDE) EVERY DAY  Labwork: NONE  Testing/Procedures: NONE  Follow-Up: Your physician recommends that you schedule a follow-up appointment in: [place] physician recommends that you schedule a follow-up appointment in: Dr Oval Linsey in 2 months   Any Other Special Instructions Will Be Listed Below (If Applicable). [place] ON Monday WITH YOUR BLOOD PRESSURE READINGS [phone]  If you need a refill on your cardiac medications before your next appointment, please call your pharmacy.

## 2016-07-23 NOTE — Progress Notes (Signed)
Cardiology Office Note   Date:  07/25/2016   ID:  Shannon [name], DOB [birth year], MRN [id number]  PCP:  Lucious Groves, DO  Cardiologist:   Skeet Latch, MD  ID: [name], MD  Chief Complaint  Patient presents with  . Follow-up  . Shortness of Breath    frequently.     History of Present Illness: Shannon [name] is a 60 y.o. female with hypertension, asthma, non-obstructive CAD, OSA, prior PE and HIV (well-controlled)  who presents for follow up.  Shannon [name] was initially referred by her infectious disease doctor, [name], MD, on 12/26/15. [name] was concerned about her lower extremity edema and shortness of breath.  Shannon [name] was started on lasix 20 mg every other day.  Shannon [name] noted shortness of breath since her hospitalization for pneumonia 06/2015.   Shannon [name] denied lower extremity edema or orthopnea but did report chest pain when laying down at night.  Shannon [name] was referred for an echo 01/2016 that revealed LVEF 60-65% awith grade 1 diastolic dysfunction and hypokinesis of the basal inferior wall.  Shannon [name] also had very mild aortic stenosis with a mean gradient of 9 mmHg and trivial AR.  PASP was 52 mmHg.  Shannon [name] had a Lexiscan Myoview at that time that was negative for ischemia.  Shannon [name] previously had a Lexiscan Cardiolite in September 2013 that revealed a medium, mild reversible defect in the anterior wall and normal systolic function. Shannon [name] subsequently underwent cardiac catheterization that revealed nonobstructive coronary disease. Shannon [name] had mild pulmonary hypertension and a normal cardiac output. Left ventriculography revealed mild to moderate aortic regurgitation. Shannon [name] had an echo May 2016 that revealed an ejection fraction of 02% and mild diastolic dysfunction. It also revealed calcification of the aortic valve, mild to moderate aortic regurgitation, and mild aortic stenosis. Peak velocity was 2.1 m/s. Shannon [name] had a 24-hour Holter in February 2015 that revealed rare PACs and PVCs.  Shannon [name] has  struggled with blood pressure control.  Since her last appointment her blood pressure has been better-controlled.  At home and at her clinic appointments it has been running in the 637C-588F systolic.  Shannon [name] reports taking her medication as instructed.  Shannon [name] does endorse some increased salt intake over Thanksgiving.  Shannon [name] is scheduled for removal of a ganglion cyst on 07/29/16.  Shannon [name] notes that Shannon [name] hasn't been sleeping well since her trip to [place].  Shannon [name] also continues to note Shannon [name] hasn't been sleeping well for the last week. Shannon [name] is being treated with Amoxicillin for an URI and endorses uses over the counter ocld and cough medication.    Past Medical History:  Diagnosis Date  . Anemia   . Anxiety   . Arthritis    "starting to; in my hands" (07/09/2015)  . Chronic asthma with acute exacerbation    "I have chronic asthma all the time; sometimes exacerbations" (07/09/2015)  . Chronic lower back pain   . Cyst of right kidney    "3 of them; dx'd in ~ 01/2015"  . GERD (gastroesophageal reflux disease)   . Heart murmur   . History of blood transfusion    "related to my brain surgery I think"  . HIV antibody positive ([place])   . HIV disease ([place])   . Hyperlipidemia   . Hypertension   . Pneumonia 07/09/2015  . Sleep apnea    "never completed part 2 of study; never wore mask" (07/09/2015)    Past Surgical History:  Procedure Laterality Date  .  ABDOMINAL HYSTERECTOMY     "robotic laparosopic"  . BRAIN SURGERY  1974   "brain tumor; benign; on top of my brain; got a plate in there"  . CARDIAC CATHETERIZATION    . TONSILLECTOMY AND ADENOIDECTOMY       Current Outpatient Prescriptions  Medication Sig Dispense Refill  . acetaminophen (TYLENOL) 500 MG tablet Take 500 mg by mouth every 6 (six) hours as needed for mild pain.    [name] albuterol (PROAIR HFA) 108 (90 Base) MCG/ACT inhaler Inhale 2 puffs into the lungs every 4 (four) hours as needed for wheezing or shortness of breath. 6.7 g 3  .  albuterol (PROVENTIL) (2.5 MG/3ML) 0.083% nebulizer solution Take 3 mLs (2.5 mg total) by nebulization every 6 (six) hours as needed for wheezing or shortness of breath. 75 mL 3  . amoxicillin (AMOXIL) 500 MG tablet Take 1 tablet (500 mg total) by mouth 2 (two) times daily. 14 tablet 0  . Biotin 1000 MCG tablet Take 1,000 mcg by mouth daily. Takes every 2-3 days     . carvedilol (COREG) 25 MG tablet Take 1 tablet (25 mg total) by mouth 2 (two) times daily. 60 tablet 5  . Coenzyme Q10 (COQ10 PO) Take 1 tablet by mouth daily.    . cyanocobalamin (,VITAMIN B-12,) 1000 MCG/ML injection Inject 1 mL (1,000 mcg total) into the muscle every 30 (thirty) days. 1 mL 3  . cyclobenzaprine (FLEXERIL) 5 MG tablet Take 1 tablet (5 mg total) by mouth 3 (three) times daily. 21 tablet 0  . dolutegravir (TIVICAY) 50 MG tablet Take 1 tablet (50 mg total) by mouth daily. 30 tablet 11  . emtricitabine-tenofovir AF (DESCOVY) 200-25 MG tablet Take 1 tablet by mouth daily. 30 tablet 11  . ENSURE (ENSURE) Take 237 mLs by mouth 2 (two) times daily between meals. (Patient taking differently: Take 237 mLs by mouth See admin instructions. Drink 237 ml every 2 days) 237 mL 11  . ezetimibe (ZETIA) 10 MG tablet Take 10 mg by mouth daily 30 tablet 5  . fluticasone (FLONASE) 50 MCG/ACT nasal spray Place 2 sprays into both nostrils daily. (Patient taking differently: Place 2 sprays into both nostrils daily as needed for allergies. ) 16 g 5  . furosemide (LASIX) 20 MG tablet Take 20 mg by mouth daily.    [name] guaiFENesin (MUCINEX) 600 MG 12 hr tablet Take 1 tablet (600 mg total) by mouth 2 (two) times daily. 60 tablet 0  . L-Lysine 500 MG CAPS Take 1 capsule by mouth once a week.    . loratadine (CLARITIN) 10 MG tablet Take 1 tablet (10 mg total) by mouth daily as needed for allergies. 30 tablet 2  . losartan (COZAAR) 100 MG tablet Take 100 mg by mouth daily.    . methocarbamol (ROBAXIN) 500 MG tablet Take 1 tablet (500 mg total) by mouth  4 (four) times daily. 60 tablet 0  . Multiple Vitamin (MULTIVITAMIN WITH MINERALS) TABS tablet Take 1 tablet by mouth daily. (Patient taking differently: Take 1 tablet by mouth See admin instructions. Take 1 tablet by mouth every 2 days) 90 tablet   . Multiple Vitamins-Minerals (ZINC) LOZG Take 1 lozenge by mouth daily. 100 lozenge 0  . NIFEdipine (PROCARDIA XL/ADALAT-CC) 60 MG 24 hr tablet Take 1 tablet (60 mg total) by mouth daily. 30 tablet 11  . POTASSIUM PO Take 1 tablet by mouth daily.    [name] PREMARIN vaginal cream Place 1 application vaginally once a week.    [name]  SYRINGE/NEEDLE, DISP, 1 ML (B-D SYRINGE/NEEDLE 1CC/25GX5/8) 25G X 5/8" 1 ML MISC 1 Units by Does not apply route every 30 (thirty) days. 50 each 0  . Tetrahydrozoline HCl (VISINE OP) Place 2 drops into both eyes as needed (for dry eyes).    [name] tiotropium (SPIRIVA HANDIHALER) 18 MCG inhalation capsule Place 1 capsule (18 mcg total) into inhaler and inhale daily. 30 capsule 3  . vitamin C (ASCORBIC ACID) 500 MG tablet Take 2 tablets (1,000 mg total) by mouth daily. (Patient taking differently: Take 500 mg by mouth daily. ) 180 tablet 0   No current facility-administered medications for this visit.    Facility-Administered Medications Ordered in Other Visits  Medication Dose Route Frequency Provider Last Rate Last Dose  . technetium tetrofosmin (TC-MYOVIEW) injection 29.9 milli Curie  17.5 millicurie Intravenous Once PRN Dorothy Spark, MD        Allergies:   Tree extract and Ciprofloxacin    Social History:  The patient  reports that Shannon [name] has never smoked. Shannon [name] has never used smokeless tobacco. Shannon [name] reports that Shannon [name] drinks alcohol. Shannon [name] reports that Shannon [name] does not use drugs.   Family History:  The patient's family history includes Asthma in her mother; Diabetes in her sister; Heart failure in her mother; Heart murmur in her brother, sister, and sister; Thyroid disease in her sister.    ROS:  Please see the history of present illness.    Otherwise, review of systems are positive for dry mouth.   All other systems are reviewed and negative.   PHYSICAL EXAM: VS:  BP (!) 171/107   Pulse 93   Ht 5' 3.5" (1.613 m)   Wt 107.1 kg (236 lb 3.2 oz)   BMI 41.18 kg/m  , BMI Body mass index is 41.18 kg/m. GENERAL:  Well appearing HEENT:  Pupils equal round and reactive, fundi not visualized, oral mucosa unremarkable NECK:  No jugular venous distention, waveform within normal limits, carotid upstroke brisk and symmetric, no bruits LYMPHATICS:  No cervical adenopathy LUNGS:  Clear to auscultation bilaterally HEART:  RRR.  PMI not displaced or sustained,S1 and S2 within normal limits, no S3, no S4, no clicks, no rubs, II/VI early-peaking systolic murmur at the right upper sternal border. I/IV diastolic murmur at the left upper sternal border.  ABD:  Flat, positive bowel sounds normal in frequency in pitch, no bruits, no rebound, no guarding, no midline pulsatile mass, no hepatomegaly, no splenomegaly EXT:  2 plus pulses throughout, 1+ pitting edema, no cyanosis no clubbing SKIN:  No rashes no nodules NEURO:  Cranial nerves II through XII grossly intact, motor grossly intact throughout PSYCH:  Cognitively intact, oriented to person place and time   EKG:  EKG is not ordered today. The ekg ordered 12/31/15 shows sinus rhythm. Rate 89 bpm. 2 PVCs.  Coronary angiography 06/01/12:10-20% OM, diffuse 20-30% LAD, 10-20% D1, 20% diffuse RCA RA 10, RV 39/12, RV EDP 12, capillary wedge pressure 19, PA 39/21, mean 27, LV 116/14, LVEDP 20, aorta 115/77 Cardiac output (Fick) 6.55, cardiac index 3.2 PVR 1.7 with units, SVR 12 with units. Shannon [name] P/QRS ratio 1 LVEF 80%. Aortic root mildly dilated. 1-2+ aortic regurgitation.  24-hour Holter 09/26/13: Sinus rhythm, rare PACs, rare PVCs  Echo 01/22/15: LVEF 70%. Mild LVH. Mild diastolic dysfunction. Normal RV function. Left atrium moderately enlarged. Right atrium mildly enlarged. Trace mitral regurgitation.  Calcific aortic valve. Mild to moderate aortic regurgitation. Mild aortic stenosis. Peak velocity 2.1 m/s trace tricuspid regurgitation  Lexiscan Myoview 01/2016:   The left ventricular ejection fraction is normal (55-65%). The EF is 60% visually. The computer generated EF is not calculated correctly and is therefore not reported.  There was no ST segment deviation noted during stress.  The study is normal. no ischemia . no infarction   Echo 01/29/16: Study Conclusions  - Left ventricle: The cavity size was normal. Wall thickness was   increased in a pattern of moderate LVH. Systolic function was   normal. The estimated ejection fraction was in the range of 60%   to 65%. Basal inferior hypokinesis. Doppler parameters are   consistent with abnormal left ventricular relaxation (grade 1   diastolic dysfunction). - Aortic valve: Trileaflet; moderately calcified leaflets. There   was very mild stenosis. There was mild regurgitation. Mean   gradient (S): 9 mm Hg. Valve area (VTI): 1.68 cm^2. - Mitral valve: There was trivial regurgitation. - Left atrium: The atrium was mildly dilated. - Right ventricle: The cavity size was normal. Systolic function   was normal. - Tricuspid valve: Peak RV-RA gradient (S): 44 mm Hg. - Pulmonary arteries: PA peak pressure: 52 mm Hg (S). - Systemic veins: IVC measured 2.3 cm with > 50% respirophasic   variation, suggesting RA pressure 8 mmHg.  Impressions:  - Normal LV size with moderate LV hypertrophy. EF 60-65%. Basal   inferior hypokinesis. Normal RV size and systolic function. Very   mild aortic stenosis, mild aortic insufficieny. Moderate   pulmonary hypertension.   Recent Labs: 08/08/2015: TSH 0.690 11/28/2015: Brain Natriuretic Peptide 54.9 03/31/2016: ALT 15 04/13/2016: BUN 17; Creatinine, Ser 1.40; Hemoglobin 13.2; Platelets 191; Potassium 3.9; Sodium 141    Lipid Panel    Component Value Date/Time   CHOL 174 11/15/2015 0952   TRIG 75  11/15/2015 0952   HDL 60 11/15/2015 0952   CHOLHDL 2.9 11/15/2015 0952   VLDL 15 11/15/2015 0952   LDLCALC 99 11/15/2015 0952      Wt Readings from Last 3 Encounters:  07/23/16 107.1 kg (236 lb 3.2 oz)  07/09/16 107.6 kg (237 lb 3.2 oz)  06/10/16 106.9 kg (235 lb 11.2 oz)      ASSESSMENT AND PLAN:  # Hypertension:  BP remains poorly-controlled.  Shannon [name] has been taking her medication as prescribed.  We will increase carvedilol to '25mg'$  bid.  Continue losartan and nifedipine.  We will ask her to start taking lasix daily.  I have encouraged her to start exercising most days of the week as well.  Shannon [name] will need a BMP in 2 weeks.   # Shortness of breath: Stress test was normal and echo revealed normal systolic function with grade 1 diastolic dysfunction.  Her poorly-controlled blood pressure is likely contributing as well as central adiposity.  Continue weight loss efforts once BP is better-controlled.  Shannon [name] will start taking lasix daily as above.   # Aortic regurgitation # Aortic stenosis: Shannon [name] has very mild aortic stenosis (mean gradient 9 mmHg) and mild aortic regurgitation.  Continue to monitor.   Current medicines are reviewed at length with the patient today.  The patient does not have concerns regarding medicines.  The following changes have been made:  Increase carvedilol to '25mg'$ .  Take lasix daily instead of prn.   Time spent: 30 minutes-Greater than 50% of this time was spent in counseling, explanation of diagnosis, planning of further management, and coordination of care.   Labs/ tests ordered today include:   No orders of the defined types were placed  in this encounter.    Disposition:   FU with Delane Stalling C. Oval Linsey, MD, [facility] in 2 months.  Follow up in hypertension clinic in 66month   This note was written with the assistance of speech recognition software.  Please excuse any transcriptional errors.  Signed, Kemaya Dorner C. ROval Linsey MD, [facility] 07/25/2016 8:27 AM    [facility]

## 2016-07-28 ENCOUNTER — Telehealth: Payer: Self-pay | Admitting: Pulmonary Disease

## 2016-07-28 DIAGNOSIS — R0602 Shortness of breath: Secondary | ICD-10-CM

## 2016-07-28 NOTE — Telephone Encounter (Signed)
Called and spoke to pt. Pt states she was referred to pulmonary rehab in 03/2016 but has not started, referral was placed by TP. Pt is requesting we re-refer her to pulm rehab. Because this was originally not under AD's name I will send phone note to AD to get approval.   Dr. Corrie Dandy please advise if ok to refer pt back to pulmonary rehab. Thanks.

## 2016-07-29 NOTE — Telephone Encounter (Signed)
   OK to refer to pulm rehab.   Monica Becton, MD 07/29/2016, 11:32 AM [facility] Pager [phone] After 3 pm or if no answer, call [phone]

## 2016-07-29 NOTE — Telephone Encounter (Signed)
Referral placed and pt aware. Nothing further needed 

## 2016-07-31 DIAGNOSIS — M67431 Ganglion, right wrist: Secondary | ICD-10-CM | POA: Diagnosis not present

## 2016-08-05 ENCOUNTER — Telehealth: Payer: Self-pay | Admitting: Pulmonary Disease

## 2016-08-05 NOTE — Telephone Encounter (Signed)
LMTCB x 1 

## 2016-08-07 NOTE — Telephone Encounter (Signed)
lmomtcb x 2  

## 2016-08-11 ENCOUNTER — Encounter (HOSPITAL_COMMUNITY): Payer: Self-pay

## 2016-08-11 ENCOUNTER — Encounter (HOSPITAL_COMMUNITY)
Admission: RE | Admit: 2016-08-11 | Discharge: 2016-08-11 | Disposition: A | Discharge status: Home / Self Care | Payer: Medicare Other | Source: Ambulatory Visit | Attending: Orthopedic Surgery | Admitting: Orthopedic Surgery

## 2016-08-11 DIAGNOSIS — M6588 Other synovitis and tenosynovitis, other site: Secondary | ICD-10-CM | POA: Insufficient documentation

## 2016-08-11 DIAGNOSIS — Z01812 Encounter for preprocedural laboratory examination: Secondary | ICD-10-CM | POA: Diagnosis not present

## 2016-08-11 HISTORY — DX: Lipodystrophy, not elsewhere classified: E88.1

## 2016-08-11 LAB — CBC
HCT: 42.5 % (ref 36.0–46.0)
Hemoglobin: 13.5 g/dL (ref 12.0–15.0)
MCH: 29.9 pg (ref 26.0–34.0)
MCHC: 31.8 g/dL (ref 30.0–36.0)
MCV: 94 fL (ref 78.0–100.0)
PLATELETS: 187 10*3/uL (ref 150–400)
RBC: 4.52 MIL/uL (ref 3.87–5.11)
RDW: 13.6 % (ref 11.5–15.5)
WBC: 5.7 10*3/uL (ref 4.0–10.5)

## 2016-08-11 LAB — BASIC METABOLIC PANEL
ANION GAP: 6 (ref 5–15)
BUN: 14 mg/dL (ref 6–20)
CALCIUM: 10 mg/dL (ref 8.9–10.3)
CO2: 33 mmol/L — ABNORMAL HIGH (ref 22–32)
Chloride: 102 mmol/L (ref 101–111)
Creatinine, Ser: 1.26 mg/dL — ABNORMAL HIGH (ref 0.44–1.00)
GFR, EST AFRICAN AMERICAN: 52 mL/min — AB (ref >60)
GFR, EST NON AFRICAN AMERICAN: 45 mL/min — AB (ref >60)
GLUCOSE: 107 mg/dL — AB (ref 65–99)
Potassium: 4.2 mmol/L (ref 3.5–5.1)
Sodium: 141 mmol/L (ref 135–145)

## 2016-08-11 NOTE — Telephone Encounter (Signed)
Called and spoke with pt and she stated that she will be having a cyst removed from her right hand on dec 30 and will hold off for now with the pulmonary rehab.  She will call after the first of the year and schedule an appt with AD.  Will forward to AD as an FYI.

## 2016-08-12 ENCOUNTER — Ambulatory Visit (INDEPENDENT_AMBULATORY_CARE_PROVIDER_SITE_OTHER): Payer: Medicare Other | Admitting: Internal Medicine

## 2016-08-12 ENCOUNTER — Encounter (HOSPITAL_COMMUNITY): Payer: Self-pay | Admitting: Emergency Medicine

## 2016-08-12 ENCOUNTER — Ambulatory Visit: Payer: Medicare Other | Admitting: Cardiovascular Disease

## 2016-08-12 ENCOUNTER — Encounter: Payer: Self-pay | Admitting: Internal Medicine

## 2016-08-12 ENCOUNTER — Telehealth: Payer: Self-pay | Admitting: *Deleted

## 2016-08-12 VITALS — BP 147/96 | HR 93 | Temp 98.2°F | Ht 63.0 in | Wt 235.0 lb

## 2016-08-12 DIAGNOSIS — B20 Human immunodeficiency virus [HIV] disease: Secondary | ICD-10-CM | POA: Diagnosis not present

## 2016-08-12 DIAGNOSIS — G4733 Obstructive sleep apnea (adult) (pediatric): Secondary | ICD-10-CM | POA: Diagnosis not present

## 2016-08-12 DIAGNOSIS — J189 Pneumonia, unspecified organism: Secondary | ICD-10-CM | POA: Diagnosis not present

## 2016-08-12 NOTE — Progress Notes (Signed)
Anesthesia Chart Review:  Pt is a 60 year old female scheduled for R wrist dorsal mass excision and joint arthrotomy on 08/22/2016 with Iran Planas, MD  - PCP Is Joni Reining, DO - Cardiologist is Skeet Latch, MD, last office visit 07/23/16 - ID is [name], MD  PMH includes:  HTN, heart murmur, hyperlipidemia, OSA, anemia, asthma, HIV, GERD. Never smoker. BMI 40.5.  Medications include: Albuterol, carvedilol,tivicay, descovy, zetia, lasix, losartan, nifedipine, potassium  Preoperative labs reviewed.   CXR 04/13/16: Lingular scarring. Right base atelectasis or scarring. Mild cardiomegaly.  EKG 04/13/16: NSR. LAD. Minimal voltage criteria for LVH, may be normal variant  Echo 01/29/16:  - Left ventricle: The cavity size was normal. Wall thickness was increased in a pattern of moderate LVH. Systolic function was normal. The estimated ejection fraction was in the range of 60% to 65%. Basal inferior hypokinesis. Doppler parameters are consistent with abnormal left ventricular relaxation (grade 1 diastolic dysfunction). - Aortic valve: Trileaflet; moderately calcified leaflets. There was very mild stenosis. There was mild regurgitation. Mean gradient (S): 9 mm Hg. Valve area (VTI): 1.68 cm^2. - Mitral valve: There was trivial regurgitation. - Left atrium: The atrium was mildly dilated. - Right ventricle: The cavity size was normal. Systolic function was normal. - Tricuspid valve: Peak RV-RA gradient (S): 44 mm Hg. - Pulmonary arteries: PA peak pressure: 52 mm Hg (S). - Systemic veins: IVC measured 2.3 cm with > 50% respirophasic variation, suggesting RA pressure 8 mmHg. - Impressions: Normal LV size with moderate LV hypertrophy. EF 60-65%. Basal inferior hypokinesis. Normal RV size and systolic function. Very mild aortic stenosis, mild aortic insufficieny. Moderate pulmonary hypertension.  Nuclear stress test 01/21/16:   The left ventricular ejection fraction is normal (55-65%). The  EF is 60% visually. The computer generated EF is not calculated correctly and is therefore not reported.  There was no ST segment deviation noted during stress.  The study is normal. no ischemia . no infarction  Holter monitor 09/26/13: Sinus rhythm, rare PACs, rare PVCs  Cardiac cath 06/01/12 (from Dr. Blenda Mounts notes):  - 10-20% OM, diffuse 20-30% LAD, 10-20% D1, 20% diffuse RCA - RA 10, RV 39/12, RV EDP 12, capillary wedge pressure 19, PA 39/21, mean 27, LV 116/14, LVEDP 20, aorta 115/77 - Cardiac output (Fick) 6.55, cardiac index 3.2 - PVR 1.7 with units, SVR 12 with units. She P/QRS ratio 1 - LVEF 80%. Aortic root mildly dilated. 1-2+ aortic regurgitation.  If no changes, I anticipate pt can proceed with surgery as scheduled.   Willeen Cass, FNP-BC [facility]/Anesthesiology Phone: [phone] 08/12/2016 1:15 PM

## 2016-08-12 NOTE — Telephone Encounter (Signed)
Patient was in lab and asked if she could have a zpac sent in? She advised she has a cough and the mucus is green. Advised her will ask her doctor and give her a call back once she responds.

## 2016-08-12 NOTE — Progress Notes (Signed)
Patient ID: Shannon [name], female   DOB: Dec 26, 1955, 60 y.o.   MRN: [id number]  HPI 60yo F with hiv disease, CD 4 count of 510/VL<20 on descovy, tivicay  He has seen dr. Caralyn Guile is planning to remove his ganglion cyst on dorsum of right hand  10 lb since 4 months ago.   She feels that her Lipodystrophy is worsening  She saw dr. Tennis Must dios for re-evaluating of OSA.    Outpatient Encounter Prescriptions as of 08/12/2016  Medication Sig  . acetaminophen (TYLENOL) 500 MG tablet Take 500 mg by mouth daily as needed for mild pain.   [name] albuterol (PROAIR HFA) 108 (90 Base) MCG/ACT inhaler Inhale 2 puffs into the lungs every 4 (four) hours as needed for wheezing or shortness of breath.  [name] albuterol (PROVENTIL) (2.5 MG/3ML) 0.083% nebulizer solution Take 3 mLs (2.5 mg total) by nebulization every 6 (six) hours as needed for wheezing or shortness of breath.  [name] amoxicillin (AMOXIL) 500 MG tablet Take 1 tablet (500 mg total) by mouth 2 (two) times daily.  . Biotin 1000 MCG tablet Take 1,000 mcg by mouth. Takes every 2-3 days   . carvedilol (COREG) 12.5 MG tablet Take 25 mg by mouth daily.   . carvedilol (COREG) 25 MG tablet Take 1 tablet (25 mg total) by mouth 2 (two) times daily.  . Coenzyme Q10 (COQ10 PO) Take 1 tablet by mouth daily.  . cyanocobalamin (,VITAMIN B-12,) 1000 MCG/ML injection Inject 1 mL (1,000 mcg total) into the muscle every 30 (thirty) days.  . cyclobenzaprine (FLEXERIL) 5 MG tablet Take 1 tablet (5 mg total) by mouth 3 (three) times daily. (Patient taking differently: Take 5 mg by mouth every evening. )  . dolutegravir (TIVICAY) 50 MG tablet Take 1 tablet (50 mg total) by mouth daily.  [name] emtricitabine-tenofovir AF (DESCOVY) 200-25 MG tablet Take 1 tablet by mouth daily.  [name] ezetimibe (ZETIA) 10 MG tablet Take 10 mg by mouth daily  . fluticasone (FLONASE) 50 MCG/ACT nasal spray Place 2 sprays into both nostrils daily. (Patient taking differently: Place 2 sprays into both  nostrils daily as needed for allergies. )  . furosemide (LASIX) 20 MG tablet Take 20 mg by mouth daily as needed for fluid or edema.   [name] guaiFENesin (MUCINEX) 600 MG 12 hr tablet Take 1 tablet (600 mg total) by mouth 2 (two) times daily. (Patient taking differently: Take 600 mg by mouth 2 (two) times daily as needed for cough or to loosen phlegm. )  . L-Lysine 500 MG CAPS Take 1 capsule by mouth once a week.  . loratadine (CLARITIN) 10 MG tablet Take 1 tablet (10 mg total) by mouth daily as needed for allergies.  [name] losartan (COZAAR) 100 MG tablet Take 100 mg by mouth daily.  . Multiple Vitamin (MULTIVITAMIN WITH MINERALS) TABS tablet Take 1 tablet by mouth daily. (Patient taking differently: Take 1 tablet by mouth daily as needed (immune support). )  . NIFEdipine (PROCARDIA XL/ADALAT-CC) 60 MG 24 hr tablet Take 1 tablet (60 mg total) by mouth daily.  [name] POTASSIUM PO Take 1 tablet by mouth daily as needed. With lasix  . PREMARIN vaginal cream Place 1 application vaginally once a week.  . SYRINGE/NEEDLE, DISP, 1 ML (B-D SYRINGE/NEEDLE 1CC/25GX5/8) 25G X 5/8" 1 ML MISC 1 Units by Does not apply route every 30 (thirty) days.  . Tetrahydrozoline HCl (VISINE OP) Place 2 drops into both eyes as needed (for dry eyes).  . vitamin C (  ASCORBIC ACID) 500 MG tablet Take 2 tablets (1,000 mg total) by mouth daily. (Patient taking differently: Take 500 mg by mouth daily as needed (immune support). )  . methocarbamol (ROBAXIN) 500 MG tablet Take 1 tablet (500 mg total) by mouth 4 (four) times daily. (Patient not taking: Reported on 08/12/2016)   Facility-Administered Encounter Medications as of 08/12/2016  Medication  . technetium tetrofosmin (TC-MYOVIEW) injection 29.9 milli Curie     Patient Active Problem List   Diagnosis Date Noted  . Muscle spasm 07/09/2016  . Ganglion cyst of dorsum of right wrist 06/10/2016  . Accessory skin tags 06/10/2016  . Nocturnal hypoxemia 05/13/2016  . Cervical radiculopathy  04/14/2016  . Sinusitis, acute 02/24/2016  . Cough 01/02/2016  . GERD (gastroesophageal reflux disease) 01/02/2016  . Exertional dyspnea 12/04/2015  . OSA (obstructive sleep apnea) 10/10/2015  . Obesity 10/10/2015  . Vitamin B12 deficiency 10/02/2015  . Inappropriate diet and eating habits 08/15/2015  . CAP (community acquired pneumonia) 07/29/2015  . Opacity noted on imaging study   . Dyspnea 07/09/2015  . History of pulmonary embolism 07/09/2015  . Asthma, moderate persistent 07/09/2015  . HIV disease ([place]) 07/09/2015  . Essential hypertension 07/09/2015  . Mild diastolic dysfunction 16/05/9603     Health Maintenance Due  Topic Date Due  . TETANUS/TDAP  03/03/1973  . PAP SMEAR  03/04/1975  . COLONOSCOPY  03/03/2004  . ZOSTAVAX  03/03/2014  . INFLUENZA VACCINE  03/24/2016     Review of Systems  Physical Exam   BP (!) 147/96   Pulse 93   Temp 98.2 F (36.8 C) (Oral)   Ht '5\' 3"'$  (1.6 m)   Wt 235 lb (106.6 kg)   BMI 41.63 kg/m   Lab Results  Component Value Date   CD4TCELL 35 03/31/2016   Lab Results  Component Value Date   CD4TABS 510 03/31/2016   CD4TABS 470 01/07/2016   CD4TABS 490 11/15/2015   Lab Results  Component Value Date   HIV1RNAQUANT <20 03/31/2016    CBC Lab Results  Component Value Date   WBC 5.7 08/11/2016   RBC 4.52 08/11/2016   HGB 13.5 08/11/2016   HCT 42.5 08/11/2016   PLT 187 08/11/2016   MCV 94.0 08/11/2016   MCH 29.9 08/11/2016   MCHC 31.8 08/11/2016   RDW 13.6 08/11/2016   LYMPHSABS 1.4 03/27/2016   MONOABS 0.5 03/27/2016   EOSABS 0.1 03/27/2016   BASOSABS 0.0 03/27/2016   BMET Lab Results  Component Value Date   NA 141 08/11/2016   K 4.2 08/11/2016   CL 102 08/11/2016   CO2 33 (H) 08/11/2016   GLUCOSE 107 (H) 08/11/2016   BUN 14 08/11/2016   CREATININE 1.26 (H) 08/11/2016   CALCIUM 10.0 08/11/2016   GFRNONAA 45 (L) 08/11/2016   GFRAA 52 (L) 08/11/2016     Assessment and Plan  hiv disease = well  controlled. Will check sed rate and crp  htn = getting better under control  ckd 2 = stable  Asthma = will continue with albuterol as needed. Will start on pulm rehab.  Lipodystrophy = likely due to hiv disease x 4yr. Medication for lipodystrophy.

## 2016-08-14 ENCOUNTER — Other Ambulatory Visit: Payer: Self-pay | Admitting: *Deleted

## 2016-08-14 LAB — HIV-1 RNA QUANT-NO REFLEX-BLD

## 2016-08-14 LAB — T-HELPER CELL (CD4) - (RCID CLINIC ONLY)
CD4 % Helper T Cell: 32 % — ABNORMAL LOW (ref 33–55)
CD4 T Cell Abs: 640 /uL (ref 400–2700)

## 2016-08-14 MED ORDER — AZITHROMYCIN 250 MG PO TABS: ORAL_TABLET | ORAL | 0 refills | Status: DC

## 2016-08-22 ENCOUNTER — Encounter (HOSPITAL_COMMUNITY): Admission: RE | Payer: Self-pay | Source: Ambulatory Visit

## 2016-08-22 ENCOUNTER — Ambulatory Visit (HOSPITAL_COMMUNITY): Admission: RE | Admit: 2016-08-22 | Payer: Medicare Other | Source: Ambulatory Visit | Admitting: Orthopedic Surgery

## 2016-08-22 SURGERY — EXCISION MASS
Anesthesia: Choice | Laterality: Right

## 2016-08-26 ENCOUNTER — Ambulatory Visit: Payer: Medicare Other

## 2016-08-26 ENCOUNTER — Other Ambulatory Visit: Payer: Self-pay | Admitting: *Deleted

## 2016-08-26 MED ORDER — ALBUTEROL SULFATE (2.5 MG/3ML) 0.083% IN NEBU: 2.5000 mg | INHALATION_SOLUTION | Freq: Four times a day (QID) | RESPIRATORY_TRACT | 3 refills | Status: DC | PRN

## 2016-09-12 DIAGNOSIS — G4733 Obstructive sleep apnea (adult) (pediatric): Secondary | ICD-10-CM | POA: Diagnosis not present

## 2016-09-12 DIAGNOSIS — J189 Pneumonia, unspecified organism: Secondary | ICD-10-CM | POA: Diagnosis not present

## 2016-09-17 ENCOUNTER — Ambulatory Visit (HOSPITAL_COMMUNITY): Payer: Medicare Other

## 2016-09-18 ENCOUNTER — Emergency Department (HOSPITAL_COMMUNITY): Payer: Medicare Other

## 2016-09-18 ENCOUNTER — Observation Stay (HOSPITAL_COMMUNITY)
Admission: EM | Admit: 2016-09-18 | Discharge: 2016-09-19 | Disposition: A | Discharge status: Home / Self Care | Payer: Medicare Other | Attending: Internal Medicine | Admitting: Internal Medicine

## 2016-09-18 ENCOUNTER — Encounter (HOSPITAL_COMMUNITY): Payer: Self-pay | Admitting: Emergency Medicine

## 2016-09-18 DIAGNOSIS — I2510 Atherosclerotic heart disease of native coronary artery without angina pectoris: Secondary | ICD-10-CM | POA: Insufficient documentation

## 2016-09-18 DIAGNOSIS — K219 Gastro-esophageal reflux disease without esophagitis: Secondary | ICD-10-CM | POA: Diagnosis not present

## 2016-09-18 DIAGNOSIS — I509 Heart failure, unspecified: Secondary | ICD-10-CM | POA: Diagnosis not present

## 2016-09-18 DIAGNOSIS — R071 Chest pain on breathing: Secondary | ICD-10-CM | POA: Diagnosis not present

## 2016-09-18 DIAGNOSIS — Z6841 Body Mass Index (BMI) 40.0 and over, adult: Secondary | ICD-10-CM | POA: Insufficient documentation

## 2016-09-18 DIAGNOSIS — J4540 Moderate persistent asthma, uncomplicated: Secondary | ICD-10-CM | POA: Diagnosis not present

## 2016-09-18 DIAGNOSIS — E538 Deficiency of other specified B group vitamins: Secondary | ICD-10-CM | POA: Insufficient documentation

## 2016-09-18 DIAGNOSIS — J4551 Severe persistent asthma with (acute) exacerbation: Secondary | ICD-10-CM | POA: Diagnosis present

## 2016-09-18 DIAGNOSIS — R0602 Shortness of breath: Secondary | ICD-10-CM | POA: Diagnosis not present

## 2016-09-18 DIAGNOSIS — J449 Chronic obstructive pulmonary disease, unspecified: Secondary | ICD-10-CM | POA: Insufficient documentation

## 2016-09-18 DIAGNOSIS — E669 Obesity, unspecified: Secondary | ICD-10-CM | POA: Diagnosis not present

## 2016-09-18 DIAGNOSIS — J4541 Moderate persistent asthma with (acute) exacerbation: Secondary | ICD-10-CM | POA: Diagnosis present

## 2016-09-18 DIAGNOSIS — B20 Human immunodeficiency virus [HIV] disease: Secondary | ICD-10-CM | POA: Diagnosis not present

## 2016-09-18 DIAGNOSIS — R0600 Dyspnea, unspecified: Secondary | ICD-10-CM | POA: Diagnosis present

## 2016-09-18 DIAGNOSIS — Z9981 Dependence on supplemental oxygen: Secondary | ICD-10-CM | POA: Diagnosis not present

## 2016-09-18 DIAGNOSIS — G4733 Obstructive sleep apnea (adult) (pediatric): Secondary | ICD-10-CM | POA: Diagnosis not present

## 2016-09-18 DIAGNOSIS — Z79899 Other long term (current) drug therapy: Secondary | ICD-10-CM | POA: Insufficient documentation

## 2016-09-18 DIAGNOSIS — H8110 Benign paroxysmal vertigo, unspecified ear: Secondary | ICD-10-CM | POA: Insufficient documentation

## 2016-09-18 DIAGNOSIS — I110 Hypertensive heart disease with heart failure: Secondary | ICD-10-CM | POA: Diagnosis not present

## 2016-09-18 DIAGNOSIS — I10 Essential (primary) hypertension: Secondary | ICD-10-CM | POA: Diagnosis present

## 2016-09-18 DIAGNOSIS — Z79890 Hormone replacement therapy (postmenopausal): Secondary | ICD-10-CM | POA: Diagnosis not present

## 2016-09-18 DIAGNOSIS — R0902 Hypoxemia: Secondary | ICD-10-CM | POA: Diagnosis not present

## 2016-09-18 LAB — I-STAT CHEM 8, ED
BUN: 12 mg/dL (ref 6–20)
CREATININE: 1.3 mg/dL — AB (ref 0.44–1.00)
Calcium, Ion: 1.09 mmol/L — ABNORMAL LOW (ref 1.15–1.40)
Chloride: 104 mmol/L (ref 101–111)
GLUCOSE: 107 mg/dL — AB (ref 65–99)
HEMATOCRIT: 38 % (ref 36.0–46.0)
Hemoglobin: 12.9 g/dL (ref 12.0–15.0)
Potassium: 3.9 mmol/L (ref 3.5–5.1)
Sodium: 141 mmol/L (ref 135–145)
TCO2: 30 mmol/L (ref 0–100)

## 2016-09-18 LAB — CBC WITH DIFFERENTIAL/PLATELET
Basophils Absolute: 0 10*3/uL (ref 0.0–0.1)
Basophils Relative: 0 %
Eosinophils Absolute: 0.1 10*3/uL (ref 0.0–0.7)
Eosinophils Relative: 1 %
HEMATOCRIT: 38.4 % (ref 36.0–46.0)
Hemoglobin: 12.3 g/dL (ref 12.0–15.0)
LYMPHS PCT: 28 %
Lymphs Abs: 1.5 10*3/uL (ref 0.7–4.0)
MCH: 29.8 pg (ref 26.0–34.0)
MCHC: 32 g/dL (ref 30.0–36.0)
MCV: 93 fL (ref 78.0–100.0)
MONO ABS: 0.3 10*3/uL (ref 0.1–1.0)
MONOS PCT: 6 %
NEUTROS ABS: 3.5 10*3/uL (ref 1.7–7.7)
Neutrophils Relative %: 65 %
Platelets: 185 10*3/uL (ref 150–400)
RBC: 4.13 MIL/uL (ref 3.87–5.11)
RDW: 13.5 % (ref 11.5–15.5)
WBC: 5.5 10*3/uL (ref 4.0–10.5)

## 2016-09-18 LAB — BASIC METABOLIC PANEL
ANION GAP: 8 (ref 5–15)
BUN: 11 mg/dL (ref 6–20)
CO2: 26 mmol/L (ref 22–32)
Calcium: 9.6 mg/dL (ref 8.9–10.3)
Chloride: 105 mmol/L (ref 101–111)
Creatinine, Ser: 1.23 mg/dL — ABNORMAL HIGH (ref 0.44–1.00)
GFR calc Af Amer: 53 mL/min — ABNORMAL LOW (ref >60)
GFR calc non Af Amer: 46 mL/min — ABNORMAL LOW (ref >60)
GLUCOSE: 104 mg/dL — AB (ref 65–99)
POTASSIUM: 4 mmol/L (ref 3.5–5.1)
Sodium: 139 mmol/L (ref 135–145)

## 2016-09-18 LAB — I-STAT ARTERIAL BLOOD GAS, ED
Acid-Base Excess: 3 mmol/L — ABNORMAL HIGH (ref 0.0–2.0)
Bicarbonate: 28.2 mmol/L — ABNORMAL HIGH (ref 20.0–28.0)
O2 Saturation: 92 %
PCO2 ART: 43.2 mmHg (ref 32.0–48.0)
PO2 ART: 63 mmHg — AB (ref 83.0–108.0)
Patient temperature: 98.6
TCO2: 30 mmol/L (ref 0–100)
pH, Arterial: 7.423 (ref 7.350–7.450)

## 2016-09-18 LAB — I-STAT TROPONIN, ED: Troponin i, poc: 0 ng/mL (ref 0.00–0.08)

## 2016-09-18 LAB — BRAIN NATRIURETIC PEPTIDE: B Natriuretic Peptide: 103.6 pg/mL — ABNORMAL HIGH (ref 0.0–100.0)

## 2016-09-18 LAB — TROPONIN I: Troponin I: <0.03 ng/mL (ref <0.03)

## 2016-09-18 MED ORDER — ACETAMINOPHEN 325 MG PO TABS
650.0000 mg | ORAL_TABLET | Freq: Four times a day (QID) | ORAL | Status: DC | PRN
  Administered 2016-09-19: 650 mg via ORAL
  Filled 2016-09-18: qty 2

## 2016-09-18 MED ORDER — ACETAMINOPHEN 650 MG RE SUPP
650.0000 mg | Freq: Four times a day (QID) | RECTAL | Status: DC | PRN
Start: 2016-09-18 — End: 2016-09-19

## 2016-09-18 MED ORDER — ENOXAPARIN SODIUM 40 MG/0.4ML ~~LOC~~ SOLN
40.0000 mg | SUBCUTANEOUS | Status: DC
  Administered 2016-09-18: 40 mg via SUBCUTANEOUS
  Filled 2016-09-18: qty 0.4

## 2016-09-18 MED ORDER — ADULT MULTIVITAMIN W/MINERALS CH
1.0000 | ORAL_TABLET | Freq: Every day | ORAL | Status: DC
  Administered 2016-09-19: 1 via ORAL
  Filled 2016-09-18: qty 1

## 2016-09-18 MED ORDER — EMTRICITABINE-TENOFOVIR AF 200-25 MG PO TABS
1.0000 | ORAL_TABLET | Freq: Every day | ORAL | Status: DC
  Administered 2016-09-18 – 2016-09-19 (×2): 1 via ORAL
  Filled 2016-09-18 (×2): qty 1

## 2016-09-18 MED ORDER — DOLUTEGRAVIR SODIUM 50 MG PO TABS
50.0000 mg | ORAL_TABLET | Freq: Every day | ORAL | Status: DC
  Administered 2016-09-18 – 2016-09-19 (×2): 50 mg via ORAL
  Filled 2016-09-18 (×2): qty 1

## 2016-09-18 MED ORDER — ACETAMINOPHEN 500 MG PO TABS
1000.0000 mg | ORAL_TABLET | Freq: Once | ORAL | Status: AC
  Administered 2016-09-18: 500 mg via ORAL
  Filled 2016-09-18: qty 2

## 2016-09-18 MED ORDER — FUROSEMIDE 10 MG/ML IJ SOLN
20.0000 mg | Freq: Once | INTRAMUSCULAR | Status: AC
  Administered 2016-09-18: 20 mg via INTRAVENOUS
  Filled 2016-09-18: qty 2

## 2016-09-18 MED ORDER — LOSARTAN POTASSIUM 50 MG PO TABS
100.0000 mg | ORAL_TABLET | Freq: Every day | ORAL | Status: DC
  Administered 2016-09-19: 100 mg via ORAL
  Filled 2016-09-18: qty 2

## 2016-09-18 MED ORDER — FUROSEMIDE 10 MG/ML IJ SOLN
20.0000 mg | Freq: Two times a day (BID) | INTRAMUSCULAR | Status: DC
  Filled 2016-09-18: qty 2

## 2016-09-18 MED ORDER — MELATONIN 3 MG PO TABS
3.0000 mg | ORAL_TABLET | Freq: Every evening | ORAL | Status: DC | PRN
  Administered 2016-09-18: 3 mg via ORAL
  Filled 2016-09-18 (×2): qty 1

## 2016-09-18 MED ORDER — CARVEDILOL 25 MG PO TABS
25.0000 mg | ORAL_TABLET | Freq: Every day | ORAL | Status: DC
  Administered 2016-09-19: 25 mg via ORAL
  Filled 2016-09-18: qty 1
  Filled 2016-09-18: qty 2

## 2016-09-18 MED ORDER — POLYETHYLENE GLYCOL 3350 17 G PO PACK: 17.0000 g | PACK | Freq: Every day | ORAL | Status: DC | PRN

## 2016-09-18 MED ORDER — IPRATROPIUM-ALBUTEROL 0.5-2.5 (3) MG/3ML IN SOLN
3.0000 mL | Freq: Four times a day (QID) | RESPIRATORY_TRACT | Status: DC
  Administered 2016-09-18 – 2016-09-19 (×2): 3 mL via RESPIRATORY_TRACT
  Filled 2016-09-18 (×2): qty 3

## 2016-09-18 MED ORDER — LORATADINE 10 MG PO TABS
10.0000 mg | ORAL_TABLET | Freq: Every day | ORAL | Status: DC | PRN
Start: 2016-09-18 — End: 2016-09-19

## 2016-09-18 MED ORDER — FLUTICASONE PROPIONATE 50 MCG/ACT NA SUSP
2.0000 | Freq: Every day | NASAL | Status: DC
  Filled 2016-09-18: qty 16

## 2016-09-18 MED ORDER — NITROGLYCERIN 2 % TD OINT
1.0000 [in_us] | TOPICAL_OINTMENT | Freq: Once | TRANSDERMAL | Status: AC
  Administered 2016-09-18: 1 [in_us] via TOPICAL
  Filled 2016-09-18: qty 1

## 2016-09-18 MED ORDER — ALBUTEROL SULFATE (2.5 MG/3ML) 0.083% IN NEBU
2.5000 mg | INHALATION_SOLUTION | RESPIRATORY_TRACT | Status: DC | PRN
  Administered 2016-09-19: 2.5 mg via RESPIRATORY_TRACT
  Filled 2016-09-18: qty 3

## 2016-09-18 MED ORDER — IPRATROPIUM-ALBUTEROL 0.5-2.5 (3) MG/3ML IN SOLN
3.0000 mL | Freq: Once | RESPIRATORY_TRACT | Status: AC
  Administered 2016-09-18: 3 mL via RESPIRATORY_TRACT
  Filled 2016-09-18: qty 3

## 2016-09-18 MED ORDER — NIFEDIPINE ER 60 MG PO TB24
60.0000 mg | ORAL_TABLET | Freq: Every day | ORAL | Status: DC
  Administered 2016-09-18 – 2016-09-19 (×2): 60 mg via ORAL
  Filled 2016-09-18 (×2): qty 1

## 2016-09-18 MED ORDER — NON FORMULARY: Freq: Every evening | Status: DC | PRN

## 2016-09-18 MED ORDER — ACETAMINOPHEN 500 MG PO TABS
500.0000 mg | ORAL_TABLET | Freq: Once | ORAL | Status: AC
  Administered 2016-09-18: 500 mg via ORAL
  Filled 2016-09-18: qty 1

## 2016-09-18 NOTE — Progress Notes (Signed)
Patient arrived to the unit, visibly short of breath. Ambulated to the bed. Call bell in reach, oriented to unit.

## 2016-09-18 NOTE — ED Notes (Signed)
Admitting at bedside 

## 2016-09-18 NOTE — H&P (Signed)
Date: 09/18/2016               Patient Name:  Shannon [name] MRN: [id number]  DOB: [birth year] Age / Sex: 61 y.o., female   PCP: Lucious Groves, DO         Medical Service: Internal Medicine Teaching Service         Attending Physician: Dr. Lucious Groves, DO    First Contact: Dr. Inda Castle Pager: [phone]  Second Contact: Dr. Juleen China Pager: [phone]       After Hours (After 5p/  First Contact Pager: [phone]  weekends / holidays): Second Contact Pager: [phone]   Chief Complaint: "I've been getting more short of breath for the last 2 weeks."  History of Present Illness: Shannon [name] is a 61 y.o. female with history of non-obstructive CAD, HTN, HIV (undetectable VL, CD4 640), and PE who presents with 2 weeks of worsening dyspnea.  For the past 2 weeks she has noticed worsening dyspnea and reduced exercise capacity limited by breathing.  She denies cough and rhinorrhea, but has had wheezing and hoarseness.  She has also had intermittent, sharp, substernal chest pain of moderate intensity which does not radiate and is not exertional.  She thinks a lot of her symptoms are because of her central obesity and lipodistrophy from her HIV meds.  She reports she wakes up gasping for breath at night and that she sleeps on 3 pillows for the past year.  She says she sleeps propped up because she gets dizzy when she lies down flat.  This sensation of the room spinning passes after a few minutes.  She reports she had vestibular rehab previously in [facility]  Does not get short of breath when she lies flat.  She follows with pulmonologist Dr Corrie Dandy' for nocturnal hypoxemia, asthma, and hypersomnolence.  She reports previously being diagnosed with OSA, but sleep study in 2017 showed noctural hypoxemia without apnea.   She was recently seen by cardiology in 06/2016 and carvedilol dose increased to 25 BID for HTN.  Normal myocardial perfusion imaging stress in 01/2016, echocardiogram with EF 60-65% and  G1DD.  Meds:  Current Meds  Medication Sig  . acetaminophen (TYLENOL) 500 MG tablet Take 500 mg by mouth every 6 (six) hours as needed for mild pain.   [name] albuterol (PROAIR HFA) 108 (90 Base) MCG/ACT inhaler Inhale 2 puffs into the lungs every 4 (four) hours as needed for wheezing or shortness of breath.  [name] albuterol (PROVENTIL) (2.5 MG/3ML) 0.083% nebulizer solution Take 3 mLs (2.5 mg total) by nebulization every 6 (six) hours as needed for wheezing or shortness of breath.  . carvedilol (COREG) 25 MG tablet Take 1 tablet (25 mg total) by mouth 2 (two) times daily. (Patient taking differently: Take 25 mg by mouth daily. )  . cyanocobalamin (,VITAMIN B-12,) 1000 MCG/ML injection Inject 1 mL (1,000 mcg total) into the muscle every 30 (thirty) days.  [name] dolutegravir (TIVICAY) 50 MG tablet Take 1 tablet (50 mg total) by mouth daily.  [name] emtricitabine-tenofovir AF (DESCOVY) 200-25 MG tablet Take 1 tablet by mouth daily.  . furosemide (LASIX) 20 MG tablet Take 20 mg by mouth daily as needed for fluid or edema.   [name] losartan (COZAAR) 100 MG tablet Take 100 mg by mouth daily.  . Multiple Vitamin (MULTIVITAMIN WITH MINERALS) TABS tablet Take 1 tablet by mouth daily. (Patient taking differently: Take 1 tablet by mouth daily as needed (immune support). )  .  NIFEdipine (PROCARDIA XL/ADALAT-CC) 60 MG 24 hr tablet Take 1 tablet (60 mg total) by mouth daily.  [name] PREMARIN vaginal cream Place 1 application vaginally once a week.  . Tetrahydrozoline HCl (VISINE OP) Place 2 drops into both eyes as needed (for dry eyes).  . vitamin C (ASCORBIC ACID) 500 MG tablet Take 2 tablets (1,000 mg total) by mouth daily. (Patient taking differently: Take 500 mg by mouth daily as needed (immune support). )     Allergies: Allergies as of 09/18/2016 - Review Complete 09/18/2016  Allergen Reaction Noted  . Tree extract Swelling and Other (See Comments) 04/13/2016  . Ciprofloxacin Hives 07/09/2015   Past Medical History:   Diagnosis Date  . Anemia   . Anxiety    HX PANIC ATTACKS  . Arthritis    "starting to; in my hands" (07/09/2015)  . Chronic asthma with acute exacerbation    "I have chronic asthma all the time; sometimes exacerbations" (07/09/2015)  . Chronic lower back pain   . Cyst of right kidney    "3 of them; dx'd in ~ 01/2015"  . GERD (gastroesophageal reflux disease)   . Heart murmur   . History of blood transfusion    "related to my brain surgery I think"  . HIV antibody positive ([place])   . HIV disease ([place])   . Hyperlipidemia   . Hypertension   . Lipodystrophy   . Pneumonia 07/09/2015  . Sleep apnea    "never completed part 2 of study; never wore mask" (07/09/2015)    Family History:  Mother died of "large heart" in 71s Sister with HTN  Social History:  Lives alone Back in school at [employer] for public health Never smoker Occasional social alcohol No drugs  Review of Systems: A complete ROS was negative except as per HPI.  Physical Exam: Blood pressure 166/96, pulse 63, temperature 97.9 F (36.6 C), temperature source Oral, resp. rate 12, height 5' 3.5" (1.613 m), weight 235 lb (106.6 kg), SpO2 90 %.   Body mass index is 40.98 kg/m.  Wt Readings from Last 3 Encounters:  09/18/16 235 lb (106.6 kg)  08/12/16 235 lb (106.6 kg)  08/11/16 232 lb 3.2 oz (105.3 kg)    Physical Exam  Constitutional: She is oriented to person, place, and time.  Obese woman sitting on edge of stretcher in no distress, very talkative and speaking in full sentences  HENT:  Mouth/Throat: Oropharynx is clear and moist. No oropharyngeal exudate.  Eyes: Conjunctivae are normal. No scleral icterus.  Neck:  Body habitus limits neck exam No evident JVD  Cardiovascular: Normal rate and regular rhythm.   3/6 systolic murmur loudest at LUSB  Pulmonary/Chest: No stridor.  No respiratory distress, tachypnea, or accessory muscle usage Reduced lung sounds and trace crackles at bilateral bases No wheezes   Abdominal:  Soft, obese, nontender  Musculoskeletal: She exhibits no tenderness.  1-2+ pitting edema to mid-shin bilaterally  Neurological: She is alert and oriented to person, place, and time.  Skin: Skin is warm and dry.  Psychiatric: She has a normal mood and affect. Her behavior is normal.   CBC Latest Ref Rng & Units 09/18/2016 09/18/2016 08/11/2016  WBC 4.0 - 10.5 K/uL - 5.5 5.7  Hemoglobin 12.0 - 15.0 g/dL 12.9 12.3 13.5  Hematocrit 36.0 - 46.0 % 38.0 38.4 42.5  Platelets 150 - 400 K/uL - 185 187   CMP Latest Ref Rng & Units 09/18/2016 09/18/2016 08/11/2016  Glucose 65 - 99 mg/dL 107(H) 104(H) 107(H)  BUN 6 - 20 mg/dL '12 11 14  '$ Creatinine 0.44 - 1.00 mg/dL 1.30(H) 1.23(H) 1.26(H)  Sodium 135 - 145 mmol/L 141 139 141  Potassium 3.5 - 5.1 mmol/L 3.9 4.0 4.2  Chloride 101 - 111 mmol/L 104 105 102  CO2 22 - 32 mmol/L - 26 33(H)  Calcium 8.9 - 10.3 mg/dL - 9.6 10.0  Total Protein 6.1 - 8.1 g/dL - - -  Total Bilirubin 0.2 - 1.2 mg/dL - - -  Alkaline Phos 33 - 130 U/L - - -  AST 10 - 35 U/L - - -  ALT 6 - 29 U/L - - -   BNP    Component Value Date/Time   BNP 103.6 (H) 09/18/2016 1054   BNP 54.9 11/28/2015 1656   ABG    Component Value Date/Time   PHART 7.423 09/18/2016 1722   PCO2ART 43.2 09/18/2016 1722   PO2ART 63.0 (L) 09/18/2016 1722   HCO3 28.2 (H) 09/18/2016 1722   TCO2 30 09/18/2016 1722   O2SAT 92.0 09/18/2016 1722   Chest Radiographs PA and Lateral 09/18/2016 Cardiomegaly.  No edema, consolidation, effusion, or PTX.  No acute disease.  EKG 09/18/2016 NSR, normal axis, nonspecific ST changes in aVL, V2, no Q waves.  Unchanged from prior.  PFTs 12/04/2015 Component     Latest Ref Rng & Units 12/04/2015  FEV1-%Pred-Pre     % 31  Pre FEV1/FVC ratio     % 50  DLCO cor % pred     % 67  Pt did not complete volumes   Assessment & Plan by Problem: Principal Problem:   Dyspnea Active Problems:   Asthma, moderate persistent   HIV disease ([facility])   Essential  hypertension   Mild diastolic dysfunction   OSA (obstructive sleep apnea)   GERD (gastroesophageal reflux disease)   61 y.o. female with obesity, nocturnal hypoxemia, asthma, and HTN presents with 2 weeks of worsening dyspnea.  Differential includes OHS, CHF, COPD.  She has some peripheral edema but is not grossly overloaded, lungs are clear, and her weight is stable, so I do not think this primarily represents pulmonary edema/CHF.  She is not wheezing or coughing, and I do not think she has a COPD exacerbation.  She has baseline poor PFTs and her dyspnea could represent OHS or progression of her COPD.  #Dyspnea -ABG -CXR PA and lat -troponin -IV lasix 20 mg BID -DuoNebs Q6H -Albuterol nebulizers Q4H PRN  #HTN -Continue home carvedilol, losartan, nifedipine, lasix  #HIV Well controlled. -Continue home Descovy, Tivicay  Fluids: none Diet: heart DVT Prophylaxis: lovenox Code Status: full  Dispo: Admit patient to Observation with expected length of stay less than 2 midnights.  Signed: Minus Liberty, MD 09/18/2016, 1:47 PM  Pager: [phone]

## 2016-09-18 NOTE — ED Provider Notes (Signed)
[facility] DEPT Provider Note   CSN: [id number] Arrival date & time: 09/18/16  1041     History   Chief Complaint Chief Complaint  Patient presents with  . Chest Pain    HPI Shannon [name] is a 61 y.o. female with a past medical history of asthma, COPD, (per EMS), HIV, CHF who presents emergency Department with chief complaint of difficulty breathing and right-sided chest pain. Patient states that over the past month she has had increased swelling. Over the past week she has had progressively worsening cough and shortness of breath. She endorses orthopnea and PND. She uses oxygen at night. Patient states that she was recently started on Lasix 20 mg and had improvement in the swelling of her feet yesterday but this morning awoke gasping for air and extremely short of breath. She called 911. Patient was treated with O2 via nasal cannula, albuterol, Atrovent and IV Solu-Medrol prior to arrival. Patient has had little improvement since that time. Eyes, fevers, chills or signs of infection. She complains of pain on the right side of her chest, but states that she sleeps on that side and feels like it may be musculoskeletal. She also feels it is related to the cough and shortness of breath. Her blood pressure is also been running very high, over the past 2 days with systolic pressures in the 200s. She did not take any of her medication this morning because it was downstairs and she was unable to breathe well.   Echo From 6?2017  "Notes Recorded by Skeet Latch, MD on 02/05/2016 at 5:19 PM Echo shows normal EF and that her heart does not relax completely. It will be important keep her blood pressure well-controlled. There was very mild stenosis of the aortic valve and elevated pulmonary pressures." HPI  Past Medical History:  Diagnosis Date  . Anemia   . Anxiety    HX PANIC ATTACKS  . Arthritis    "starting to; in my hands" (07/09/2015)  . Chronic asthma with acute exacerbation    "I have chronic asthma all the time; sometimes exacerbations" (07/09/2015)  . Chronic lower back pain   . Cyst of right kidney    "3 of them; dx'd in ~ 01/2015"  . GERD (gastroesophageal reflux disease)   . Heart murmur   . History of blood transfusion    "related to my brain surgery I think"  . HIV antibody positive ([place])   . HIV disease ([place])   . Hyperlipidemia   . Hypertension   . Lipodystrophy   . Pneumonia 07/09/2015  . Sleep apnea    "never completed part 2 of study; never wore mask" (07/09/2015)    Patient Active Problem List   Diagnosis Date Noted  . Muscle spasm 07/09/2016  . Ganglion cyst of dorsum of right wrist 06/10/2016  . Accessory skin tags 06/10/2016  . Nocturnal hypoxemia 05/13/2016  . Cervical radiculopathy 04/14/2016  . Sinusitis, acute 02/24/2016  . Cough 01/02/2016  . GERD (gastroesophageal reflux disease) 01/02/2016  . Exertional dyspnea 12/04/2015  . OSA (obstructive sleep apnea) 10/10/2015  . Obesity 10/10/2015  . Vitamin B12 deficiency 10/02/2015  . Inappropriate diet and eating habits 08/15/2015  . CAP (community acquired pneumonia) 07/29/2015  . Opacity noted on imaging study   . Dyspnea 07/09/2015  . History of pulmonary embolism 07/09/2015  . Asthma, moderate persistent 07/09/2015  . HIV disease ([place]) 07/09/2015  . Essential hypertension 07/09/2015  . Mild diastolic dysfunction 66/44/0347    Past Surgical History:  Procedure Laterality Date  . ABDOMINAL HYSTERECTOMY     "robotic laparosopic"  . BRAIN SURGERY  1974   "brain tumor; benign; on top of my brain; got a plate in there"  . CARDIAC CATHETERIZATION    . TONSILLECTOMY AND ADENOIDECTOMY      OB History    No data available       Home Medications    Prior to Admission medications   Medication Sig Start Date End Date Taking? Authorizing Provider  acetaminophen (TYLENOL) 500 MG tablet Take 500 mg by mouth daily as needed for mild pain.     Historical Provider, MD    albuterol (PROAIR HFA) 108 (90 Base) MCG/ACT inhaler Inhale 2 puffs into the lungs every 4 (four) hours as needed for wheezing or shortness of breath. 07/09/16   Paden, MD  albuterol (PROVENTIL) (2.5 MG/3ML) 0.083% nebulizer solution Take 3 mLs (2.5 mg total) by nebulization every 6 (six) hours as needed for wheezing or shortness of breath. 08/26/16   Lucious Groves, DO  amoxicillin (AMOXIL) 500 MG tablet Take 1 tablet (500 mg total) by mouth 2 (two) times daily. 07/09/16   Jose Shirl , MD  azithromycin (ZITHROMAX Z-PAK) 250 MG tablet Follow directions on the package 08/14/16   [name], MD  Biotin 1000 MCG tablet Take 1,000 mcg by mouth. Takes every 2-3 days     Historical Provider, MD  carvedilol (COREG) 12.5 MG tablet Take 25 mg by mouth daily.     Historical Provider, MD  carvedilol (COREG) 25 MG tablet Take 1 tablet (25 mg total) by mouth 2 (two) times daily. 07/23/16 10/21/16  Skeet Latch, MD  Coenzyme Q10 (COQ10 PO) Take 1 tablet by mouth daily.    Historical Provider, MD  cyanocobalamin (,VITAMIN B-12,) 1000 MCG/ML injection Inject 1 mL (1,000 mcg total) into the muscle every 30 (thirty) days. 08/29/15   Collier Salina, MD  cyclobenzaprine (FLEXERIL) 5 MG tablet Take 1 tablet (5 mg total) by mouth 3 (three) times daily. Patient taking differently: Take 5 mg by mouth every evening.  07/09/16   [place], MD  dolutegravir (TIVICAY) 50 MG tablet Take 1 tablet (50 mg total) by mouth daily. 11/28/15   [name], MD  emtricitabine-tenofovir AF (DESCOVY) 200-25 MG tablet Take 1 tablet by mouth daily. 11/28/15   [name], MD  ezetimibe (ZETIA) 10 MG tablet Take 10 mg by mouth daily 05/27/16   Skeet Latch, MD  fluticasone [facility]) 50 MCG/ACT nasal spray Place 2 sprays into both nostrils daily. Patient taking differently: Place 2 sprays into both nostrils daily as needed for allergies.  02/24/16   Ophelia Shoulder, MD  furosemide (LASIX) 20 MG  tablet Take 20 mg by mouth daily as needed for fluid or edema.     Historical Provider, MD  guaiFENesin (MUCINEX) 600 MG 12 hr tablet Take 1 tablet (600 mg total) by mouth 2 (two) times daily. Patient taking differently: Take 600 mg by mouth 2 (two) times daily as needed for cough or to loosen phlegm.  06/10/16   Alphonzo Grieve, MD  L-Lysine 500 MG CAPS Take 1 capsule by mouth once a week.    Historical Provider, MD  loratadine (CLARITIN) 10 MG tablet Take 1 tablet (10 mg total) by mouth daily as needed for allergies. 06/10/16 06/10/17  Alphonzo Grieve, MD  losartan (COZAAR) 100 MG tablet Take 100 mg by mouth daily.    Historical Provider, MD  methocarbamol (ROBAXIN) 500 MG tablet Take 1 tablet (500 mg total) by mouth 4 (four) times daily. Patient not taking: Reported on 08/12/2016 04/16/16   [name], MD  Multiple Vitamin (MULTIVITAMIN WITH MINERALS) TABS tablet Take 1 tablet by mouth daily. Patient taking differently: Take 1 tablet by mouth daily as needed (immune support).  08/29/15   Collier Salina, MD  NIFEdipine (PROCARDIA XL/ADALAT-CC) 60 MG 24 hr tablet Take 1 tablet (60 mg total) by mouth daily. 04/17/16 04/17/17  Skeet Latch, MD  POTASSIUM PO Take 1 tablet by mouth daily as needed. With lasix    Historical Provider, MD  PREMARIN vaginal cream Place 1 application vaginally once a week. 04/03/16   Historical Provider, MD  SYRINGE/NEEDLE, DISP, 1 ML (B-D SYRINGE/NEEDLE 1CC/25GX5/8) 25G X 5/8" 1 ML MISC 1 Units by Does not apply route every 30 (thirty) days. 10/02/15   Lucious Groves, DO  Tetrahydrozoline HCl (VISINE OP) Place 2 drops into both eyes as needed (for dry eyes).    Historical Provider, MD  vitamin C (ASCORBIC ACID) 500 MG tablet Take 2 tablets (1,000 mg total) by mouth daily. Patient taking differently: Take 500 mg by mouth daily as needed (immune support).  08/29/15   Collier Salina, MD    Family History Family History  Problem Relation Age of Onset  . Asthma Mother    . Heart failure Mother     cardiomyopathy  . Heart murmur Sister   . Heart murmur Brother   . Diabetes Sister   . Thyroid disease Sister   . Heart murmur Sister     Social History Social History  Substance Use Topics  . Smoking status: Never Smoker  . Smokeless tobacco: Never Used  . Alcohol use 0.0 oz/week     Comment: Rarely.     Allergies   Tree extract and Ciprofloxacin   Review of Systems Review of Systems Ten systems reviewed and are negative for acute change, except as noted in the HPI.    Physical Exam Updated Vital Signs BP 171/75 (BP Location: Right Arm)   Pulse 65   Temp 97.9 F (36.6 C) (Oral)   Resp 18   Ht 5' 3.5" (1.613 m)   Wt 106.6 kg   SpO2 97% Comment: 97% ra  BMI 40.98 kg/m   Physical Exam  Constitutional: She is oriented to person, place, and time. She appears well-developed and well-nourished. No distress.  HENT:  Head: Normocephalic and atraumatic.  Eyes: Conjunctivae are normal. No scleral icterus.  Neck: Normal range of motion. JVD present.  Cardiovascular: Normal rate, regular rhythm and normal heart sounds.  Exam reveals no gallop and no friction rub.   No murmur heard. 2+PITTING EDEMA BL EXTREMITIES  Pulmonary/Chest: Effort normal and breath sounds normal. No respiratory distress.  Increased effort, minimal breath sounds with poor air movement. No wheezing.  Abdominal: Soft. Bowel sounds are normal. She exhibits no distension and no mass. There is no tenderness. There is no guarding.  Neurological: She is alert and oriented to person, place, and time.  Skin: Skin is warm and dry. She is not diaphoretic.  Nursing note and vitals reviewed.    ED Treatments / Results  Labs (all labs ordered are listed, but only abnormal results are displayed) Labs Reviewed  BASIC METABOLIC PANEL - Abnormal; Notable for the following:       Result Value   Glucose, Bld 104 (*)    Creatinine, Ser 1.23 (*)    GFR calc  non Af Amer 46 (*)    GFR  calc Af Amer 53 (*)    All other components within normal limits  BRAIN NATRIURETIC PEPTIDE - Abnormal; Notable for the following:    B Natriuretic Peptide 103.6 (*)    All other components within normal limits  I-STAT CHEM 8, ED - Abnormal; Notable for the following:    Creatinine, Ser 1.30 (*)    Glucose, Bld 107 (*)    Calcium, Ion 1.09 (*)    All other components within normal limits  CBC WITH DIFFERENTIAL/PLATELET  I-STAT TROPOININ, ED    EKG  EKG Interpretation None       Radiology No results found.  Procedures Procedures (including critical care time)  Medications Ordered in ED Medications - No data to display   Initial Impression / Assessment and Plan / ED Course  I have reviewed the triage vital signs and the nursing notes.  Pertinent labs & imaging results that were available during my care of the patient were reviewed by me and considered in my medical decision making (see chart for details).     Patient with JVD, pitting edema and recent HTN with systolic [id number] and worsening swelling and dyspnea, I feel this is an exacerbation of HF and as she is lasix naive taking 20 mg daily I have started with 20 IV and nitro paste. Her breathing seems improved.  Final Clinical Impressions(s) / ED Diagnoses   Final diagnoses:  Acute on chronic congestive heart failure, unspecified congestive heart failure type [facility])  Dyspnea    New Prescriptions New Prescriptions   No medications on file     Margarita Mail, PA-C 09/18/16 [place], MD 09/22/16 1701

## 2016-09-18 NOTE — ED Triage Notes (Signed)
Pt in from home via [facility], pt reports SOB with hx of COPD onset x 3 days, pt c/o mid CP that pt reports using 2 L [place] at home, pt A&O x4, pt speaks in complete sentences,m A&O x4

## 2016-09-18 NOTE — ED Notes (Signed)
Upon this RN entering the room, patient states that she needs a breathing treatment, something for her headache and another bed because the bed that she has is uncomfortable. Pt also upset that her oxygen was not only, this RN advised patient that PA Kenton Kingfisher turned her O2 off when she arrived due to her O2 sats being WDL. Pt stated that she still needed the O2. PA harris made aware of patients requests.

## 2016-09-18 NOTE — ED Notes (Signed)
When RN came to re-check BP patient c/o central non-radiating sub-sternal chest pain rated 2/10. Patient denies SOB, Nausea, Dizziness, Lightheadedness. Patient in NAD. Pt sts pain last for just a few seconds and then resolved. MD notified. EKG completed and reviewed by Juleen China MD. Sts will stay with present plan of care and continue to monitor.

## 2016-09-18 NOTE — ED Notes (Signed)
Pt refused to take full '1000mg'$  dose of tylenol stating "I already take way too much medication." this RN advised patient to let us know if she felt like she needed the other '500mg'$  of tylenol later and I would speak with PA to see if she could order another dose.

## 2016-09-18 NOTE — ED Notes (Signed)
Radiology at bedside

## 2016-09-18 NOTE — ED Notes (Signed)
Patient called out upset that her BP has stayed elevated and requesting to speak to MD about plan of care. RN paged provider who states will come down as soon as possible. Patient updated on plan of care.

## 2016-09-18 NOTE — ED Notes (Signed)
Pt called this RN to the room stating that she did not understand why we were not doing anything for her. This RN advised patient that we were waiting on lab results and for her chest xray to be done. Patient stated that she needs fluids and abx, this rn explained the need for xray and lab results prior to administering any medications in order to ensure that we do not exacerbate her breathing problem. Pt verbalized understanding.

## 2016-09-18 NOTE — ED Notes (Signed)
Patient provided sandwich per request

## 2016-09-18 NOTE — ED Notes (Signed)
Switched pt bed because pt was extremely uncomfortable. Pt back from Xray and placed back on the monitor at this time.

## 2016-09-19 DIAGNOSIS — J069 Acute upper respiratory infection, unspecified: Secondary | ICD-10-CM | POA: Diagnosis not present

## 2016-09-19 DIAGNOSIS — B20 Human immunodeficiency virus [HIV] disease: Secondary | ICD-10-CM | POA: Diagnosis not present

## 2016-09-19 DIAGNOSIS — J4540 Moderate persistent asthma, uncomplicated: Secondary | ICD-10-CM | POA: Diagnosis not present

## 2016-09-19 DIAGNOSIS — J449 Chronic obstructive pulmonary disease, unspecified: Secondary | ICD-10-CM

## 2016-09-19 DIAGNOSIS — R0902 Hypoxemia: Secondary | ICD-10-CM | POA: Diagnosis not present

## 2016-09-19 DIAGNOSIS — I5030 Unspecified diastolic (congestive) heart failure: Secondary | ICD-10-CM | POA: Diagnosis not present

## 2016-09-19 DIAGNOSIS — Z21 Asymptomatic human immunodeficiency virus [HIV] infection status: Secondary | ICD-10-CM | POA: Diagnosis not present

## 2016-09-19 DIAGNOSIS — Z9981 Dependence on supplemental oxygen: Secondary | ICD-10-CM

## 2016-09-19 DIAGNOSIS — R0609 Other forms of dyspnea: Secondary | ICD-10-CM | POA: Diagnosis not present

## 2016-09-19 DIAGNOSIS — H8110 Benign paroxysmal vertigo, unspecified ear: Secondary | ICD-10-CM

## 2016-09-19 DIAGNOSIS — I110 Hypertensive heart disease with heart failure: Secondary | ICD-10-CM

## 2016-09-19 DIAGNOSIS — Z79899 Other long term (current) drug therapy: Secondary | ICD-10-CM | POA: Diagnosis not present

## 2016-09-19 DIAGNOSIS — I509 Heart failure, unspecified: Secondary | ICD-10-CM | POA: Diagnosis not present

## 2016-09-19 LAB — BASIC METABOLIC PANEL
ANION GAP: 8 (ref 5–15)
BUN: 18 mg/dL (ref 6–20)
CALCIUM: 9.8 mg/dL (ref 8.9–10.3)
CO2: 29 mmol/L (ref 22–32)
CREATININE: 1.38 mg/dL — AB (ref 0.44–1.00)
Chloride: 103 mmol/L (ref 101–111)
GFR calc Af Amer: 46 mL/min — ABNORMAL LOW (ref >60)
GFR, EST NON AFRICAN AMERICAN: 40 mL/min — AB (ref >60)
GLUCOSE: 143 mg/dL — AB (ref 65–99)
Potassium: 3.7 mmol/L (ref 3.5–5.1)
Sodium: 140 mmol/L (ref 135–145)

## 2016-09-19 LAB — CBC
HCT: 38.1 % (ref 36.0–46.0)
HEMOGLOBIN: 12.3 g/dL (ref 12.0–15.0)
MCH: 29.9 pg (ref 26.0–34.0)
MCHC: 32.3 g/dL (ref 30.0–36.0)
MCV: 92.5 fL (ref 78.0–100.0)
PLATELETS: 213 10*3/uL (ref 150–400)
RBC: 4.12 MIL/uL (ref 3.87–5.11)
RDW: 13.2 % (ref 11.5–15.5)
WBC: 7.3 10*3/uL (ref 4.0–10.5)

## 2016-09-19 MED ORDER — BUDESONIDE 0.25 MG/2ML IN SUSP: 0.2500 mg | Freq: Two times a day (BID) | RESPIRATORY_TRACT | Status: DC

## 2016-09-19 MED ORDER — ONDANSETRON HCL 4 MG PO TABS
4.0000 mg | ORAL_TABLET | Freq: Three times a day (TID) | ORAL | Status: DC | PRN
  Administered 2016-09-19: 4 mg via ORAL
  Filled 2016-09-19: qty 1

## 2016-09-19 MED ORDER — BUDESONIDE 0.25 MG/2ML IN SUSP: 0.2500 mg | Freq: Two times a day (BID) | RESPIRATORY_TRACT | 12 refills | Status: DC

## 2016-09-19 MED ORDER — FUROSEMIDE 20 MG PO TABS: 20.0000 mg | ORAL_TABLET | Freq: Every day | ORAL | Status: DC | PRN

## 2016-09-19 MED ORDER — ALBUTEROL SULFATE (2.5 MG/3ML) 0.083% IN NEBU: 2.5000 mg | INHALATION_SOLUTION | Freq: Four times a day (QID) | RESPIRATORY_TRACT | 3 refills | Status: DC | PRN

## 2016-09-19 MED ORDER — ALBUTEROL SULFATE HFA 108 (90 BASE) MCG/ACT IN AERS: 2.0000 | INHALATION_SPRAY | RESPIRATORY_TRACT | 3 refills | Status: DC | PRN

## 2016-09-19 MED ORDER — ALUM & MAG HYDROXIDE-SIMETH 200-200-20 MG/5ML PO SUSP
15.0000 mL | Freq: Once | ORAL | Status: AC
  Administered 2016-09-19: 15 mL via ORAL
  Filled 2016-09-19: qty 30

## 2016-09-19 NOTE — Discharge Instructions (Addendum)
You were admitted to the hospital with worsening shortness of breath.  We determined that you do not have a pneumonia or asthma attack, but you do seem to be requiring more oxygen than previously.  For now, wear the oxygen at all times with the goal of keeping your O2 saturation 90-94%.  You will probably need 2-3L when walking, but may only need 1-2L while resting.  Use 2L at night when sleeping.  This could be due to a viral infection which will get better on its own in the next week or two, or could be due to worsening of lungs.  Follow-up in the clinic, and they will check your breathing again and see if you continue to need oxygen during the day.  It will also be important to see Dr Corrie Dandy again, so please call his office for an appointment.

## 2016-09-19 NOTE — Progress Notes (Signed)
Patient is discharge to home accompanied by  NT via wheelchair. Discharge instructions given . Patient verbalizes understanding. All personal belongings given. Telemetry box and IV removed prior to discharge and site in good condition.Patient went home with O2 from [place] and voucher for transportation provided by the social worker.

## 2016-09-19 NOTE — Progress Notes (Signed)
   Subjective: Feels much improved today, with better breathing.  Feels ready to go home and continue recuperating.  She is unsure if she ever had a PE, but does remember being on warfarin for several months years ago.  Objective:  Vital signs in last 24 hours: Vitals:   09/19/16 0008 09/19/16 0009 09/19/16 0500 09/19/16 0700  BP: (!) 191/103 (!) 174/97  (!) 166/86  Pulse: 81   84  Resp: 20   20  Temp: 97.6 F (36.4 C)   97.7 F (36.5 C)  TempSrc: Oral   Oral  SpO2: 96%   95%  Weight:   235 lb 14.4 oz (107 kg)   Height:       Physical Exam  Constitutional: She is oriented to person, place, and time.  Obese woman sitting in bed with nasal canula in place in no distress  HENT:  Nose: Nose normal.  Mouth/Throat: Oropharynx is clear and moist. No oropharyngeal exudate.  Cardiovascular: Normal rate and regular rhythm.   Pulmonary/Chest: Effort normal and breath sounds normal. No respiratory distress. She has no wheezes. She has no rales.  Neurological: She is alert and oriented to person, place, and time.  Psychiatric: She has a normal mood and affect. Her behavior is normal.   CBC Latest Ref Rng & Units 09/19/2016 09/18/2016 09/18/2016  WBC 4.0 - 10.5 K/uL 7.3 - 5.5  Hemoglobin 12.0 - 15.0 g/dL 12.3 12.9 12.3  Hematocrit 36.0 - 46.0 % 38.1 38.0 38.4  Platelets 150 - 400 K/uL 213 - 185   CMP Latest Ref Rng & Units 09/19/2016 09/18/2016 09/18/2016  Glucose 65 - 99 mg/dL 143(H) 107(H) 104(H)  BUN 6 - 20 mg/dL '18 12 11  '$ Creatinine 0.44 - 1.00 mg/dL 1.38(H) 1.30(H) 1.23(H)  Sodium 135 - 145 mmol/L 140 141 139  Potassium 3.5 - 5.1 mmol/L 3.7 3.9 4.0  Chloride 101 - 111 mmol/L 103 104 105  CO2 22 - 32 mmol/L 29 - 26  Calcium 8.9 - 10.3 mg/dL 9.8 - 9.6  Total Protein 6.1 - 8.1 g/dL - - -  Total Bilirubin 0.2 - 1.2 mg/dL - - -  Alkaline Phos 33 - 130 U/L - - -  AST 10 - 35 U/L - - -  ALT 6 - 29 U/L - - -     Assessment/Plan:  Principal Problem:   Dyspnea Active Problems:  Asthma, moderate persistent   HIV disease (HCC)   Essential hypertension   Mild diastolic dysfunction   OSA (obstructive sleep apnea)   GERD (gastroesophageal reflux disease)   Dizziness   61 y.o. female with obesity, reactive airway disease, and nocturnal hypoxemia with worsening dyspnea and hypoxia.  She does not have pneumonia, asthma exacerbation, ACS, or pulmonary edema.  This may represent worsening of her primary pulmonary disease, possibly COPD or OHS.  #Dyspnea #Hypoxia No pneumonia.  Could represent OHS, but less likely with elevated HCO3 from chronic metabolic compensation.  Her recent PFTs show severe obstructive disease and reduced diffusing capacity, but she is not wheezing.  Could represent asthma/COPD, may benefit from ICS and she prefers nebulizer to inhaler. -Ambulatory O2 sats -Pulmicort BID -F/u with pulmonology  #HTN -Continue home carvedilol, losartan, nifedipine, lasix  #HIV Well controlled. -Continue home Descovy, T[place]  Fluids: none Diet: heart DVT Prophylaxis: lovenox Code Status: full  Dispo: Anticipated discharge today.   Minus Liberty, MD 09/19/2016, 12:17 PM Pager: [phone]

## 2016-09-19 NOTE — Progress Notes (Addendum)
Patient alert and oriented, pt. C/o been dehydrated that is given her cp she stated she need fluid and antibiotic, explain to the patient why she is not getting fluid pt. Insisted she need fluid. Patient refuse lasix this am, respiratory treatment given. MD paged via text, awaiting MD call back, pt. Agree to speak with MD when rounding. Will continue to monitor the pt.

## 2016-09-19 NOTE — Care Management Note (Signed)
Case Management Note  Patient Details  Name: JOHNIYA DURFEE MRN: [id number] Date of Birth: 08/06/1956  Subjective/Objective:                   Action/Plan:   Expected Discharge Date:  09/19/16               Expected Discharge Plan:  [place]  In-House Referral:     Discharge planning Services  CM Consult  Post Acute Care Choice:  Home Health Choice offered to:  Patient  DME Arranged:  3-N-1, Oxygen DME Agency:  [place] Arranged:  PT, OT [facility] Agency:  [place]  Status of Service:  Completed, signed off  If discussed at [place] of Stay Meetings, dates discussed:    Additional Comments: Cm met with pt in room to offer choice  of home health agency. Pt chooss [facility] to render HHPT. Referral called to [facility] rep, jermaine and to notify Jermaine pt's O2 requirements are changing to continuous.  CM notified [place] DME, Brad to please have baricommode shipped to house, please deliver a transport tank to room for taxi home for pt. CM notified [facility] for taxi voucher home (aware of O2).  No other CM needs were communicated. Dellie Catholic, RN 09/19/2016, 12:13 PM

## 2016-09-19 NOTE — Progress Notes (Signed)
Patient states she was taking antibiotics and she needs to take them here. Notified Internal Medicine. Will follow up in the morning.

## 2016-09-19 NOTE — Progress Notes (Signed)
Patient complained of headache, BP 166/86. MD notified, medication administered.

## 2016-09-19 NOTE — Progress Notes (Signed)
   09/19/16 1000  PT Visit Information  Last PT Received On 09/19/16  Assistance Needed +1  History of Present Illness 61 y.o. female with a past medical history of asthma, COPD, (per EMS), HIV, CHF who presents emergency Department with chief complaint of difficulty breathing and right-sided chest pain  Home Living  Family/patient expects to be discharged to: Private residence  [place] at Discharge Family;Neighbor  Type of Home House (townhouse)  Home Access Level entry  [place] Two level;Bed/bath upstairs  Alternate Level Stairs-Number of Steps 15  Alternate Level Stairs-Rails Can reach both  [place] - 2 wheels;Cane - single point  PT Recommendation  Follow Up Recommendations Home health PT;Supervision - Intermittent (HHOT (Vestibular rehab))  PT equipment 3in1 (PT);Other (comment) (needs bariatric 3N1; HHOT to assess tub equipment; home O2)  Full evaluation note to follow.  Pt treated for BPPV left anterior and horizontal canals with good results with pt feeling significantly less dizzy after treatment.  Pt will need bariatric 3N1 for home as well as HHPT and HHOT f/u for vestibular rehab.  Does require O2 to maintain sats.  Would like pt to transition to Outpt PT for vestibular rehab after a few weeks of [place] therapy and pt states she should be able to find a ride.  Would need a prescription for this to use in a few weeks.  Pt is safe to go home with above equipment and f/u arranged. [place] [phone] (pager)

## 2016-09-19 NOTE — Progress Notes (Signed)
SATURATION QUALIFICATIONS: (This note is used to comply with regulatory documentation for home oxygen)  Patient Saturations on Room Air at Rest = 87-90%  Patient Saturations on Room Air while Ambulating = NT as sats at rest below 87%  Patient Saturations on 3 Liters of oxygen while Ambulating =  >90%  Please briefly explain why patient needs home oxygen:Pt needs O2 as she desats on RA.  Thanks.  [place] [phone] (pager)

## 2016-09-19 NOTE — Discharge Summary (Signed)
Name: Shannon [name] DOB: [birth year] 61 y.o. PCP: Lucious Groves, DO  Date of Admission: 09/18/2016 10:41 AM Date of Discharge: 09/19/2016 Attending Physician: Lucious Groves, DO  Discharge Diagnosis:  Principal Problem:   Dyspnea Active Problems:   Asthma, moderate persistent   HIV disease ([place])   Essential hypertension   Mild diastolic dysfunction   OSA (obstructive sleep apnea)   GERD (gastroesophageal reflux disease)   Dizziness   Discharge Medications: Allergies as of 09/19/2016      Reactions   Tree Extract Swelling, Other (See Comments)   Swelling to eyes   Ciprofloxacin Hives      Medication List    STOP taking these medications   methocarbamol 500 MG tablet Commonly known as:  ROBAXIN     TAKE these medications   acetaminophen 500 MG tablet Commonly known as:  TYLENOL Take 500 mg by mouth every 6 (six) hours as needed for mild pain.   albuterol 108 (90 Base) MCG/ACT inhaler Commonly known as:  PROAIR HFA Inhale 2 puffs into the lungs every 4 (four) hours as needed for wheezing or shortness of breath.   albuterol (2.5 MG/3ML) 0.083% nebulizer solution Commonly known as:  PROVENTIL Take 3 mLs (2.5 mg total) by nebulization every 6 (six) hours as needed for wheezing or shortness of breath.   budesonide 0.25 MG/2ML nebulizer solution Commonly known as:  PULMICORT Take 2 mLs (0.25 mg total) by nebulization 2 (two) times daily.   carvedilol 25 MG tablet Commonly known as:  COREG Take 1 tablet (25 mg total) by mouth 2 (two) times daily. What changed:  when to take this   cyanocobalamin 1000 MCG/ML injection Commonly known as:  (VITAMIN B-12) Inject 1 mL (1,000 mcg total) into the muscle every 30 (thirty) days.   dolutegravir 50 MG tablet Commonly known as:  TIVICAY Take 1 tablet (50 mg total) by mouth daily.   emtricitabine-tenofovir AF 200-25 MG tablet Commonly known as:  DESCOVY Take 1 tablet by mouth daily.   ezetimibe 10 MG  tablet Commonly known as:  ZETIA Take 10 mg by mouth daily   fluticasone 50 MCG/ACT nasal spray Commonly known as:  FLONASE Place 2 sprays into both nostrils daily. What changed:  when to take this  reasons to take this   furosemide 20 MG tablet Commonly known as:  LASIX Take 20 mg by mouth daily as needed for fluid or edema.   guaiFENesin 600 MG 12 hr tablet Commonly known as:  MUCINEX Take 1 tablet (600 mg total) by mouth 2 (two) times daily. What changed:  when to take this  reasons to take this   loratadine 10 MG tablet Commonly known as:  CLARITIN Take 1 tablet (10 mg total) by mouth daily as needed for allergies.   losartan 100 MG tablet Commonly known as:  COZAAR Take 100 mg by mouth daily.   multivitamin with minerals Tabs tablet Take 1 tablet by mouth daily. What changed:  when to take this  reasons to take this   NIFEdipine 60 MG 24 hr tablet Commonly known as:  PROCARDIA XL/ADALAT-CC Take 1 tablet (60 mg total) by mouth daily.   POTASSIUM PO Take 1 tablet by mouth daily as needed. With lasix   PREMARIN vaginal cream Generic drug:  conjugated estrogens Place 1 application vaginally once a week.   SYRINGE/NEEDLE (DISP) 1 ML 25G X 5/8" 1 ML Misc Commonly known as:  B-D SYRINGE/NEEDLE 1CC/25GX5/8 1 Units by Does  not apply route every 30 (thirty) days.   VISINE OP Place 2 drops into both eyes as needed (for dry eyes).   vitamin C 500 MG tablet Commonly known as:  ASCORBIC ACID Take 2 tablets (1,000 mg total) by mouth daily. What changed:  how much to take  when to take this  reasons to take this            Durable Medical Equipment        Start     Ordered   09/19/16 1136  For home use only DME 3 n 1  Once     09/19/16 1135   09/19/16 1109  For home use only DME oxygen  Once    Question Answer Comment  Mode or (Route) Nasal cannula   Liters per Minute 3   Frequency Continuous (stationary and portable oxygen unit needed)     Oxygen conserving device Yes   Oxygen delivery system Gas      09/19/16 1109      Disposition and follow-up:   Shannon [name] was discharged from [facility] in Stable condition.  At the hospital follow up visit please address:  1.  Dyspnea and Hypoxia.  Check ambulatory pulse oximetry.  Ask about symptoms of dyspnea.  Ensure pulmonology follow-up scheduled.  2.  HTN.  Continue managing antihypertensives.  3.  BPPV.  Ask about vertigo, ensure [facility] PT vestibular rehab has been visiting..  4.  Labs / imaging needed at time of follow-up: none  5.  Pending labs/ test needing follow-up: none  Follow-up Appointments: Follow-up Information    [place]. Schedule an appointment as soon as possible for a visit in 1 week(s).   Why:  They should call you on Mondary to make appointment.  If you do not hear from them, please call. Contact information: [address] [place], [place]. Schedule an appointment as soon as possible for a visit in 1 month(s).   Specialty:  Pulmonary Disease Contact information: [address] [place] [address] [phone]           Hospital Course by problem list: Principal Problem:   Dyspnea Active Problems:   Asthma, moderate persistent   HIV disease (HCC)   Essential hypertension   Mild diastolic dysfunction   OSA (obstructive sleep apnea)   GERD (gastroesophageal reflux disease)   Dizziness   1. Dyspnea Shannon [name] is a 61 year old woman with history of reactive airway disease, noctural hypoxemia, and obesity who presented with subacute worsening dyspnea.   She is follows by pulmonologist Dr Corrie Dandy and wears 1L O2 at night after a recent sleep study showed noctural desaturations but no apnea.  In the weeks prior to admission, she had worsening shortness of breath and exercise capacity and started using her albuterol several  times per day with only transient relief.  She reported some sinus congestion, sore throat, and vocal hoarseness, but no rhinorrhea or cough.  She was found to be hypoxic to low 90s on room air, but her lungs were clear without wheezing or crackles.  She did not have an asthma exacerbation, was not volume overloaded.  She did not have pneumonia with no fever, leukocytosis, or infiltrate on chest radiographs.  ACS was ruled out with stable EKG and undetectable troponins.  Her ABG showed mild hypoxia without retention or acidosis.  She symptomatically improved, but required 3L [place] with ambulation to maintain O2 saturations about 90%.  This could represent worsening of her underlying pulmonary disease, OHS, or viral URI.  2. HTN BPs in 160s/80s on home antihypertensives.  3. HIV Well controlled, with CD4 count 640 and undetectable VL.  Continued on home antiretrovirals.  Discharge Vitals:   BP (!) 166/86 (BP Location: Right Arm)   Pulse 84   Temp 97.7 F (36.5 C) (Oral)   Resp 20   Ht '5\' 3"'$  (1.6 m)   Wt 235 lb 14.4 oz (107 kg) Comment: scale a  SpO2 95%   BMI 41.79 kg/m   Pertinent Labs, Studies, and Procedures:   CBC Latest Ref Rng & Units 09/19/2016 09/18/2016 09/18/2016  WBC 4.0 - 10.5 K/uL 7.3 - 5.5  Hemoglobin 12.0 - 15.0 g/dL 12.3 12.9 12.3  Hematocrit 36.0 - 46.0 % 38.1 38.0 38.4  Platelets 150 - 400 K/uL 213 - 185   CMP Latest Ref Rng & Units 09/19/2016 09/18/2016 09/18/2016  Glucose 65 - 99 mg/dL 143(H) 107(H) 104(H)  BUN 6 - 20 mg/dL '18 12 11  '$ Creatinine 0.44 - 1.00 mg/dL 1.38(H) 1.30(H) 1.23(H)  Sodium 135 - 145 mmol/L 140 141 139  Potassium 3.5 - 5.1 mmol/L 3.7 3.9 4.0  Chloride 101 - 111 mmol/L 103 104 105  CO2 22 - 32 mmol/L 29 - 26  Calcium 8.9 - 10.3 mg/dL 9.8 - 9.6  Total Protein 6.1 - 8.1 g/dL - - -  Total Bilirubin 0.2 - 1.2 mg/dL - - -  Alkaline Phos 33 - 130 U/L - - -  AST 10 - 35 U/L - - -  ALT 6 - 29 U/L - - -   ABG    Component Value Date/Time   PHART 7.423  09/18/2016 1722   PCO2ART 43.2 09/18/2016 1722   PO2ART 63.0 (L) 09/18/2016 1722   HCO3 28.2 (H) 09/18/2016 1722   TCO2 30 09/18/2016 1722   O2SAT 92.0 09/18/2016 1722    Chest Radiographs PA and Lateral 09/18/2016 Cardiomegaly.  No edema, consolidation, effusion, or PTX.  No acute disease.  Discharge Instructions: Discharge Instructions    Diet - low sodium heart healthy    Complete by:  As directed    Increase activity slowly    Complete by:  As directed      You were admitted to the hospital with worsening shortness of breath.  We determined that you do not have a pneumonia or asthma attack, but you do seem to be requiring more oxygen than previously.  For now, wear the oxygen at all times with the goal of keeping your O2 saturation 90-94%.  You will probably need 2-3L when walking, but may only need 1-2L while resting.  Use 2L at night when sleeping.  This could be due to a viral infection which will get better on its own in the next week or two, or could be due to worsening of lungs.  Follow-up in the clinic, and they will check your breathing again and see if you continue to need oxygen during the day.  It will also be important to see Dr Corrie Dandy again, so please call his office for an appointment.  Signed: Minus Liberty, MD 09/19/2016, 11:36 AM   Pager: [phone]

## 2016-09-19 NOTE — Evaluation (Signed)
Physical Therapy Evaluation and D/C Patient Details Name: Shannon [name] MRN: [id number] DOB: June 27, 1956 Today's Date: 09/19/2016   History of Present Illness  61 y.o. female with a past medical history of asthma, COPD, (per EMS), HIV, CHF who presents emergency Department with chief complaint of difficulty breathing and right-sided chest pain  Clinical Impression  Pt admitted with above diagnosis. Pt currently without significant functional limitations after treatment today. Pt with left horizontal canal and left anterior canal BPPV.  Treated with good results with pt dizziness from 4/10 to 0/10.  Pt steady with gait without device in controlled environment.  Pt feels comfortable going home and continuing therapy at home.  O2 sat note below as she desats without O2.  Will not follow as pt is being discharged to day. All further needs can be addressed by HHPT.    Follow Up Recommendations Home health PT;Supervision - Intermittent (HHOT (Vestibular rehab))    Equipment Recommendations  3in1 (PT);Other (comment) (needs bariatric 3N1; HHOT to assess tub equipment; home O2)    Recommendations for Other Services       Precautions / Restrictions Precautions Precautions: Fall Restrictions Weight Bearing Restrictions: No      Mobility  Bed Mobility Overal bed mobility: Needs Assistance Bed Mobility: Rolling;Sidelying to Sit;Supine to Sit Rolling: Independent Sidelying to sit: Independent Supine to sit: Independent        Transfers Overall transfer level: Needs assistance Equipment used: None Transfers: Sit to/from Stand Sit to Stand: Independent            Ambulation/Gait Ambulation/Gait assistance: Supervision Ambulation Distance (Feet): 25 Feet Assistive device: None Gait Pattern/deviations: Step-through pattern;Decreased stride length   Gait velocity interpretation: Below normal speed for age/gender General Gait Details: Pt was able to ambulate with supervision in  room.  Good steady gait.  Did not want to walk further due to doing all vestibular treatment.  Pt did desat - see sat note.   Stairs            Wheelchair Mobility    Modified Rankin (Stroke Patients Only)       Balance Overall balance assessment: Needs assistance Sitting-balance support: No upper extremity supported;Feet supported Sitting balance-Leahy Scale: Good     Standing balance support: No upper extremity supported;During functional activity Standing balance-Leahy Scale: Fair Standing balance comment: can stand statically with good balance.                              Pertinent Vitals/Pain Pain Assessment: No/denies pain  SATURATION QUALIFICATIONS: (This note is used to comply with regulatory documentation for home oxygen)  Patient Saturations on Room Air at Rest = 87-90%  Patient Saturations on Room Air while Ambulating = NT as sats at rest below 87%  Patient Saturations on 3 Liters of oxygen while Ambulating =  >90%  Please briefly explain why patient needs home oxygen:Pt needs O2 as she desats on RA  Home Living Family/patient expects to be discharged to:: Private residence Living Arrangements: Alone Available Help at Discharge: Family;Neighbor Type of Home: House (townhouse) Home Access: Level entry     Home Layout: Two level;Bed/bath upstairs;1/2 bath on main level Home Equipment: Walker - 2 wheels;Cane - single point      Prior Function Level of Independence: Independent               Hand Dominance        Extremity/Trunk Assessment   Upper  Extremity Assessment Upper Extremity Assessment: Defer to OT evaluation    Lower Extremity Assessment Lower Extremity Assessment: Generalized weakness    Cervical / Trunk Assessment Cervical / Trunk Assessment: Normal  Communication   Communication: No difficulties  Cognition Arousal/Alertness: Awake/alert Behavior During Therapy: WFL for tasks assessed/performed Overall  Cognitive Status: Within Functional Limits for tasks assessed                      General Comments General comments (skin integrity, edema, etc.): Performed supine head roll with positive test to left.  Also positive test for left anterior canal BPPV.  Treated with BBQ roll for left HC.  Then treated with canalith repositioning for left anterior canal BPPV.  Pts dizziness went from 4/10 to 0/10.  Pt thrilled.      Exercises     Assessment/Plan    PT Assessment All further PT needs can be met in the next venue of care  PT Problem List Decreased activity tolerance;Decreased balance;Decreased mobility          PT Treatment Interventions      PT Goals (Current goals can be found in the Care Plan section)  Acute Rehab PT Goals Patient Stated Goal: to go home PT Goal Formulation: All assessment and education complete, DC therapy    Frequency     Barriers to discharge        Co-evaluation               End of Session Equipment Utilized During Treatment: Gait belt;Oxygen Activity Tolerance: Patient limited by fatigue;Patient tolerated treatment well Patient left: in chair;with call bell/phone within reach Nurse Communication: Mobility status    Functional Assessment Tool Used: clinical observation Functional Limitation: Mobility: Walking and moving around Mobility: Walking and Moving Around Current Status (S5053): At least 1 percent but less than 20 percent impaired, limited or restricted Mobility: Walking and Moving Around Goal Status [phone]): 0 percent impaired, limited or restricted Mobility: Walking and Moving Around Discharge Status [phone]): 0 percent impaired, limited or restricted    Time: 9024-0973 PT Time Calculation (min) (ACUTE ONLY): 60 min   Charges:   PT Evaluation $PT Eval High Complexity: 1 Procedure PT Treatments $Gait Training: 8-22 mins $Therapeutic Activity: 8-22 mins $Canalith Rep Proc: 8-22 mins   PT G Codes:   PT G-Codes **NOT FOR  INPATIENT CLASS** Functional Assessment Tool Used: clinical observation Functional Limitation: Mobility: Walking and moving around Mobility: Walking and Moving Around Current Status (Z3299): At least 1 percent but less than 20 percent impaired, limited or restricted Mobility: Walking and Moving Around Goal Status [phone]): 0 percent impaired, limited or restricted Mobility: Walking and Moving Around Discharge Status [phone]): 0 percent impaired, limited or restricted    Denice Paradise 09/19/2016, 4:09 PM [place] ,PT Acute Rehabilitation [phone] (pager)

## 2016-09-19 NOTE — Progress Notes (Signed)
Reassess patient after the breathing treatment, pt. Stated she is better, denies pain, no shortness of breath. Will continue to monitor the patient.

## 2016-09-19 NOTE — H&P (Signed)
Internal Medicine Attending Admission Note  I saw and evaluated the patient. I reviewed the resident's note and I agree with the resident's findings and plan as documented in the resident's note.  Assessment & Plan by Problem:   Dyspnea 2/2 viral URI in the setting of Asthma/COPD overlap syndrome - Her SOB is significantly improved. She does report recent sick contact, congestion and malaise, No evidence for pneumonia or significant asthma exacerbation. - Supportive care, refill albuterol, start Pulmicort nebulizer for asthma. - I thought we had tested for influenza, this would have been a good idea,  By the time of writing this note she has already been discharged, I will call and offer her empiric tamiflu.    HIV disease ([place]) - Stable continue home meds    Essential hypertension - Has actually improved from my last visit with her, remains above goal, continue medications, will readdress at follow up..    Mild diastolic dysfunction - Received 1 dose of IV lasix, feels a little better, but now thirsty, overall I do not feel this is a CHF exacerbation.    BPPV (benign paroxysmal positional vertigo) - Unable to lay flat not due to breathing but due to vertigo.  Consulted PT/ vestibular rehab, dix-hallpike was positive and she was treated by PT.   Dispo: overall stable for discharge home, has appointment with me on 2/22, provided her with card and instructions to call if not better or worsening.  She was mildly hypoxic with ambulation, she already has home oxygen for nocturnal use, will also have her use this with exertion until my follow up.   Chief Complaint(s):SOB  History - key components related to admission:Briefly Shannon [name] is a 61 yo female with history of non obstructive CAD, HTN, well controlled HIV, and suspected Astma/COPD overlap syndrome.  I am her PCP, she also follows closely with Dr Baxter Flattery for ID, Dr Corrie Dandy of pulmonology, and Dr Oval Linsey for cardiology.  She has a  complicated PMH especially in regards to her pulmonary disease.  We attempted to clarify this further with PFTs but she was unable to fully complete, she did have evidence of severe obstruction which is thought to be due to Asthma/COPD overlap.  She has been suboptimally treated mainly due to difficulty using metered dose inhalers as well as side effects from medications.  Most recently she has been treated only with albuterol.  In aditiion she had a sleep study with suprisingly did not find apnea but did find nocturnal hypoxia and she was started on nocturnal O2.   As far as he recent history she notes running out of her albuterol, she is not sure why she did not refill, did not get around to it, instead she has been using her Symbicort PRN for about the last 2 weeks, she notes this has not worked as well.  However most acutly last Monday after school she visited her friend with a 91 year old who is "sick and snotty" but she could not help but kiss him  After this she started to feel generally more tired, fatigued, with a sore throat, nasal congestion, and sinus headache.  Her breathing continued to get worse so she decided to present to the ED.  The EDP was concerned for CHF exacerbation and gave her a dose of IV lasix and asked IMTS to admit.  Lab results: Reviewed in Epic  Physical Exam - key components related to admission: General: resting in bed, very talkative, appreciative, and complementary  HEENT: EOMI, no scleral icterus, MMM, clear oropharynx, swollen inferior turbinates, tenderness of maxillary and frontal sinuses  Cardiac: RRR, no rubs, murmurs or gallops Pulm: clear to auscultation bilaterally, moving normal volumes of air, no wheezing Abd: soft, nontender, nondistended, BS present Ext: warm and well perfused, no pedal edema Neuro: alert and oriented X3  Vitals:   09/19/16 0009 09/19/16 0500 09/19/16 0700 09/19/16 1357  BP: (!) 174/97  (!) 166/86   Pulse:   84   Resp:   20   Temp:    97.7 F (36.5 C)   TempSrc:   Oral   SpO2:   95% 94%  Weight:  235 lb 14.4 oz (107 kg)    Height:

## 2016-09-21 ENCOUNTER — Other Ambulatory Visit: Payer: Self-pay

## 2016-09-21 DIAGNOSIS — M6281 Muscle weakness (generalized): Secondary | ICD-10-CM | POA: Diagnosis not present

## 2016-09-21 DIAGNOSIS — G4733 Obstructive sleep apnea (adult) (pediatric): Secondary | ICD-10-CM | POA: Diagnosis not present

## 2016-09-21 DIAGNOSIS — J45909 Unspecified asthma, uncomplicated: Secondary | ICD-10-CM | POA: Diagnosis not present

## 2016-09-21 DIAGNOSIS — R269 Unspecified abnormalities of gait and mobility: Secondary | ICD-10-CM | POA: Diagnosis not present

## 2016-09-21 DIAGNOSIS — J449 Chronic obstructive pulmonary disease, unspecified: Secondary | ICD-10-CM | POA: Diagnosis not present

## 2016-09-21 MED ORDER — OSELTAMIVIR PHOSPHATE 30 MG PO CAPS: 30.0000 mg | ORAL_CAPSULE | Freq: Two times a day (BID) | ORAL | 0 refills | Status: DC

## 2016-09-21 NOTE — Telephone Encounter (Signed)
Transition Care Management Follow-up Telephone Call   Date discharged?sat 1/27   How have you been since you were released from the hospital? im declining, I need an abx   Do you understand why you were in the hospital?BP high, wheezing, chest pain   Do you understand the discharge instructions? yes   Where were you discharged to? home   Items Reviewed:  Medications reviewed: yes, no new medications  Allergies reviewed: no  Dietary changes reviewed: salt free  Referrals reviewed: cardiologist, pulmonary   Functional Questionnaire:   Activities of Daily Living (ADLs):   She states they are independent in the following: independent in all States they require assistance with the following:nothing    Any transportation issues/concerns? I use [facility]   Any patient concerns? If I dont get abx i'll    Confirmed importance and date/time of follow-up visits scheduled 2/22  Dr Heber [place]  Confirmed with patient if condition begins to worsen call PCP or go to the ER.  Patient was given the office number and encouraged to call back with question or concerns.  Yes but I dont want to have to go there. I need some abx I know because my BP is 176/92

## 2016-09-21 NOTE — Telephone Encounter (Signed)
I talked to the patient, she reports chills, nasal congestion, sinus headache and muscle aches.  I was concerned that we had not tested her for flu and I think this is the more likely diagnosis.  I have ordered her Tamiflu '30mg'$  BID (renally adjusted) x5 days.  She will follow up next week in the [facility].  She will call back if worsening.

## 2016-09-21 NOTE — Telephone Encounter (Signed)
Requesting antibiotic to be filled. Please call pt back. Pt needs hospital TOC.

## 2016-09-21 NOTE — Telephone Encounter (Signed)
Pt wants you to call in an ABX she states that her BP is 176/92 and that always means she has an infection. She states she has a productive cough w/ greyish mucous. She denies fevers. Please advise

## 2016-09-21 NOTE — Addendum Note (Signed)
Addended by: Lucious Groves on: 09/21/2016 06:28 PM   Modules accepted: Orders

## 2016-09-21 NOTE — Telephone Encounter (Signed)
I attempted called at 5:13, no answer, left message that I will call back.  I am a little hesitant to prescribed antibiotics for high blood pressure, I had discussed for her before that I wanted her to try symptomatic treatment for at least a week before trial of antibiotics.

## 2016-09-22 ENCOUNTER — Telehealth: Payer: Self-pay

## 2016-09-22 NOTE — Telephone Encounter (Signed)
Mary from [facility] states she is unable to get in touch with patient to start services.

## 2016-09-22 NOTE — Telephone Encounter (Signed)
I know her phone works as I spoke to her yesterday, did they try to leave a message as she does have a voicemail also.

## 2016-09-23 NOTE — Telephone Encounter (Signed)
Called [facility] and confirmed same ph#, she states she has tried multiple times and left messages also with no results, I tried just now and did not even get vmail, I will continue to call and remind her that [facility] is trying to call also the [facility] is continuing to try

## 2016-09-24 NOTE — Telephone Encounter (Signed)
Called and lm for rtc 

## 2016-09-24 NOTE — Telephone Encounter (Signed)
rtc to pt, got vmail, lm for rtc

## 2016-09-24 NOTE — Telephone Encounter (Signed)
Pt is calling back, requesting to speak with a nurse. Please call back.

## 2016-09-29 ENCOUNTER — Ambulatory Visit (INDEPENDENT_AMBULATORY_CARE_PROVIDER_SITE_OTHER): Payer: Medicare Other | Admitting: Internal Medicine

## 2016-09-29 ENCOUNTER — Ambulatory Visit: Payer: Medicare Other | Admitting: Cardiovascular Disease

## 2016-09-29 ENCOUNTER — Telehealth (HOSPITAL_COMMUNITY): Payer: Self-pay | Admitting: Internal Medicine

## 2016-09-29 ENCOUNTER — Encounter: Payer: Self-pay | Admitting: *Deleted

## 2016-09-29 VITALS — BP 122/79 | HR 86 | Temp 97.9°F | Ht 63.0 in | Wt 239.8 lb

## 2016-09-29 DIAGNOSIS — R05 Cough: Secondary | ICD-10-CM | POA: Diagnosis not present

## 2016-09-29 DIAGNOSIS — J3489 Other specified disorders of nose and nasal sinuses: Secondary | ICD-10-CM

## 2016-09-29 DIAGNOSIS — J029 Acute pharyngitis, unspecified: Secondary | ICD-10-CM | POA: Diagnosis not present

## 2016-09-29 DIAGNOSIS — Z79899 Other long term (current) drug therapy: Secondary | ICD-10-CM | POA: Diagnosis not present

## 2016-09-29 DIAGNOSIS — R51 Headache: Secondary | ICD-10-CM | POA: Diagnosis not present

## 2016-09-29 DIAGNOSIS — I10 Essential (primary) hypertension: Secondary | ICD-10-CM | POA: Diagnosis not present

## 2016-09-29 DIAGNOSIS — M791 Myalgia: Secondary | ICD-10-CM | POA: Diagnosis not present

## 2016-09-29 DIAGNOSIS — Z21 Asymptomatic human immunodeficiency virus [HIV] infection status: Secondary | ICD-10-CM

## 2016-09-29 DIAGNOSIS — J0100 Acute maxillary sinusitis, unspecified: Secondary | ICD-10-CM

## 2016-09-29 DIAGNOSIS — B20 Human immunodeficiency virus [HIV] disease: Secondary | ICD-10-CM

## 2016-09-29 MED ORDER — AZITHROMYCIN 500 MG PO TABS: 500.0000 mg | ORAL_TABLET | Freq: Every day | ORAL | 0 refills | Status: AC

## 2016-09-29 MED ORDER — GUAIFENESIN-DM 100-10 MG/5ML PO SYRP: 5.0000 mL | ORAL_SOLUTION | ORAL | 0 refills | Status: DC | PRN

## 2016-09-29 NOTE — Progress Notes (Signed)
   CC: follow-up of cough  HPI:  ShannonShannon [name] is a 61 y.o. F who presents for hospital follow-up. Shannon [name] was hospitalized 1/26-1/27 with complaints of dyspnea secondary to viral URI complicated by Asthma/COPD overlap syndrome. At time of discharge, her dyspnea had significantly improved. She was treated with Tamiflu after discharge due to complaints of persistent URI symptoms. She returns today with complaints of sore throat, nasal & sinus congestion, headache, myalgias and cough productive of scant yellow sputum. She has not taken her temp at home however reports feeling febrile. She is requesting Azithromycin or Amoxicillin.   Past Medical History:  Diagnosis Date  . Anemia   . Anxiety    HX PANIC ATTACKS  . Arthritis    "starting to; in my hands" (07/09/2015)  . Chronic asthma with acute exacerbation    "I have chronic asthma all the time; sometimes exacerbations" (07/09/2015)  . Chronic lower back pain   . Cyst of right kidney    "3 of them; dx'd in ~ 01/2015"  . GERD (gastroesophageal reflux disease)   . Heart murmur   . History of blood transfusion    "related to my brain surgery I think"  . HIV antibody positive ([place])   . HIV disease ([place])   . Hyperlipidemia   . Hypertension   . Lipodystrophy   . Pneumonia 07/09/2015  . Sleep apnea    "never completed part 2 of study; never wore mask" (07/09/2015)    Review of Systems:  Review of Systems  Constitutional: Positive for chills and malaise/fatigue. Negative for fever.  HENT: Positive for congestion, sinus pain and sore throat.   Respiratory: Positive for cough, sputum production and shortness of breath. Negative for wheezing.   Cardiovascular: Positive for leg swelling. Negative for chest pain.  Gastrointestinal: Negative for abdominal pain, diarrhea, nausea and vomiting.  Musculoskeletal: Positive for myalgias.  Neurological: Positive for headaches. Negative for dizziness.   Physical Exam: Physical Exam    Constitutional: She appears well-developed and well-nourished. She appears distressed.  HENT:  Head: Normocephalic and atraumatic.  Nose: Right sinus exhibits maxillary sinus tenderness. Left sinus exhibits maxillary sinus tenderness.  Mouth/Throat: No oropharyngeal exudate.  Eyes: Conjunctivae are normal. No scleral icterus.  Cardiovascular: Normal rate, regular rhythm and normal heart sounds.   Pulmonary/Chest: Effort normal and breath sounds normal. No respiratory distress.  Abdominal: Soft. Bowel sounds are normal. She exhibits no distension. There is no tenderness.  Musculoskeletal: She exhibits edema. She exhibits no tenderness.  Skin: She is not diaphoretic.    Vitals:   09/29/16 0945  BP: 122/79  Pulse: 86  Temp: 97.9 F (36.6 C)  TempSrc: Oral  SpO2: 93%  Weight: 239 lb 12.8 oz (108.8 kg)  Height: '5\' 3"'$  (1.6 m)    Assessment & Plan:   See Encounters Tab for problem based charting.  Patient seen with Dr. Angelia Mould

## 2016-09-29 NOTE — Telephone Encounter (Signed)
Pt returned nurse call, sent msg to nurse ... KJ

## 2016-09-29 NOTE — Progress Notes (Deleted)
Cardiology Office Note   Date:  09/29/2016   ID:  Shannon [name], DOB [birth year], MRN [id number]  PCP:  Shannon Groves, DO  Cardiologist:   Shannon Latch, MD  ID: Shannon Basques, MD  No chief complaint on file.    History of Present Illness: Shannon [name] is a 61 y.o. female with hypertension, asthma, non-obstructive CAD, OSA, prior PE and HIV (well-controlled)  who presents for follow up.  Shannon [name] was initially referred by her infectious disease doctor, Shannon Basques, MD, on 12/26/15. Dr. Baxter [name] was concerned about her lower extremity edema and shortness of breath.  She was started on lasix 20 mg every other day.  Shannon [name] noted shortness of breath since her hospitalization for pneumonia 06/2015.   She denied lower extremity edema or orthopnea but did report chest pain when laying down at night.  She was referred for an echo 01/2016 that revealed LVEF 60-65% awith grade 1 diastolic dysfunction and hypokinesis of the basal inferior wall.  She also had very mild aortic stenosis with a mean gradient of 9 mmHg and trivial AR.  PASP was 52 mmHg.  She had a Lexiscan Myoview at that time that was negative for ischemia.  Shannon [name] previously had a Lexiscan Cardiolite in September 2013 that revealed a medium, mild reversible defect in the anterior wall and normal systolic function. She subsequently underwent cardiac catheterization that revealed nonobstructive coronary disease. She had mild pulmonary hypertension and a normal cardiac output. Left ventriculography revealed mild to moderate aortic regurgitation. She had an echo May 2016 that revealed an ejection fraction of 91% and mild diastolic dysfunction. It also revealed calcification of the aortic valve, mild to moderate aortic regurgitation, and mild aortic stenosis. Peak velocity was 2.1 m/s. She had a 24-hour Holter in February 2015 that revealed rare PACs and PVCs.  Shannon [name] has struggled with blood pressure control.  Since her last appointment  her blood pressure has been better-controlled.  At home and at her clinic appointments it has been running in the 505W-979Y systolic.  She reports taking her medication as instructed.  She does endorse some increased salt intake over Thanksgiving.  She is scheduled for removal of a ganglion cyst on 07/29/16.  Shannon [name] notes that she hasn't been sleeping well since her trip to [place].  She also continues to note She hasn't been sleeping well for the last week. She is being treated with Amoxicillin for an URI and endorses uses over the counter ocld and cough medication.   Shannon [name] was admitted to the hospital 08/2016 due to dyspnea.  No infectious source was found and she was not volume overloaded at the time. She was noted to have mild hypoxia at rest. It was felt to be due to a viral upper respiratory infection and a possible flare of asthma/COPD.   Past Medical History:  Diagnosis Date  . Anemia   . Anxiety    HX PANIC ATTACKS  . Arthritis    "starting to; in my hands" (07/09/2015)  . Chronic asthma with acute exacerbation    "I have chronic asthma all the time; sometimes exacerbations" (07/09/2015)  . Chronic lower back pain   . Cyst of right kidney    "3 of them; dx'd in ~ 01/2015"  . GERD (gastroesophageal reflux disease)   . Heart murmur   . History of blood transfusion    "related to my brain surgery I think"  . HIV antibody positive ([place])   .  HIV disease ([place])   . Hyperlipidemia   . Hypertension   . Lipodystrophy   . Pneumonia 07/09/2015  . Sleep apnea    "never completed part 2 of study; never wore mask" (07/09/2015)    Past Surgical History:  Procedure Laterality Date  . ABDOMINAL HYSTERECTOMY     "robotic laparosopic"  . BRAIN SURGERY  1974   "brain tumor; benign; on top of my brain; got a plate in there"  . CARDIAC CATHETERIZATION    . TONSILLECTOMY AND ADENOIDECTOMY       Current Outpatient Prescriptions  Medication Sig Dispense Refill  . acetaminophen (TYLENOL)  500 MG tablet Take 500 mg by mouth every 6 (six) hours as needed for mild pain.     [name] albuterol (PROAIR HFA) 108 (90 Base) MCG/ACT inhaler Inhale 2 puffs into the lungs every 4 (four) hours as needed for wheezing or shortness of breath. 6.7 g 3  . albuterol (PROVENTIL) (2.5 MG/3ML) 0.083% nebulizer solution Take 3 mLs (2.5 mg total) by nebulization every 6 (six) hours as needed for wheezing or shortness of breath. 75 mL 3  . azithromycin (ZITHROMAX) 500 MG tablet Take 1 tablet (500 mg total) by mouth daily. Take 1 tablet daily for 3 days. 5 tablet 0  . budesonide (PULMICORT) 0.25 MG/2ML nebulizer solution Take 2 mLs (0.25 mg total) by nebulization 2 (two) times daily. 60 mL 12  . carvedilol (COREG) 25 MG tablet Take 1 tablet (25 mg total) by mouth 2 (two) times daily. (Patient taking differently: Take 25 mg by mouth daily. ) 60 tablet 5  . cyanocobalamin (,VITAMIN B-12,) 1000 MCG/ML injection Inject 1 mL (1,000 mcg total) into the muscle every 30 (thirty) days. 1 mL 3  . dolutegravir (TIVICAY) 50 MG tablet Take 1 tablet (50 mg total) by mouth daily. 30 tablet 11  . emtricitabine-tenofovir AF (DESCOVY) 200-25 MG tablet Take 1 tablet by mouth daily. 30 tablet 11  . ezetimibe (ZETIA) 10 MG tablet Take 10 mg by mouth daily (Patient not taking: Reported on 09/18/2016) 30 tablet 5  . fluticasone (FLONASE) 50 MCG/ACT nasal spray Place 2 sprays into both nostrils daily. (Patient taking differently: Place 2 sprays into both nostrils daily as needed for allergies. ) 16 g 5  . furosemide (LASIX) 20 MG tablet Take 20 mg by mouth daily as needed for fluid or edema.     [name] guaiFENesin (MUCINEX) 600 MG 12 hr tablet Take 1 tablet (600 mg total) by mouth 2 (two) times daily. (Patient taking differently: Take 600 mg by mouth 2 (two) times daily as needed for cough or to loosen phlegm. ) 60 tablet 0  . guaiFENesin-dextromethorphan (ROBITUSSIN DM) 100-10 MG/5ML syrup Take 5 mLs by mouth every 4 (four) hours as needed for  cough. 118 mL 0  . loratadine (CLARITIN) 10 MG tablet Take 1 tablet (10 mg total) by mouth daily as needed for allergies. 30 tablet 2  . losartan (COZAAR) 100 MG tablet Take 100 mg by mouth daily.    . Multiple Vitamin (MULTIVITAMIN WITH MINERALS) TABS tablet Take 1 tablet by mouth daily. (Patient taking differently: Take 1 tablet by mouth daily as needed (immune support). ) 90 tablet   . NIFEdipine (PROCARDIA XL/ADALAT-CC) 60 MG 24 hr tablet Take 1 tablet (60 mg total) by mouth daily. 30 tablet 11  . oseltamivir (TAMIFLU) 30 MG capsule Take 1 capsule (30 mg total) by mouth 2 (two) times daily. 10 capsule 0  . POTASSIUM PO Take 1 tablet  by mouth daily as needed. With lasix    . PREMARIN vaginal cream Place 1 application vaginally once a week.    . SYRINGE/NEEDLE, DISP, 1 ML (B-D SYRINGE/NEEDLE 1CC/25GX5/8) 25G X 5/8" 1 ML MISC 1 Units by Does not apply route every 30 (thirty) days. (Patient not taking: Reported on 09/18/2016) 50 each 0  . Tetrahydrozoline HCl (VISINE OP) Place 2 drops into both eyes as needed (for dry eyes).    . vitamin C (ASCORBIC ACID) 500 MG tablet Take 2 tablets (1,000 mg total) by mouth daily. (Patient taking differently: Take 500 mg by mouth daily as needed (immune support). ) 180 tablet 0   No current facility-administered medications for this visit.    Facility-Administered Medications Ordered in Other Visits  Medication Dose Route Frequency Provider Last Rate Last Dose  . technetium tetrofosmin (TC-MYOVIEW) injection 29.9 milli Curie  16.1 millicurie Intravenous Once PRN Dorothy Spark, MD        Allergies:   Tree extract and Ciprofloxacin    Social History:  The patient  reports that she has never smoked. She has never used smokeless tobacco. She reports that she drinks alcohol. She reports that she does not use drugs.   Family History:  The patient's family history includes Asthma in her mother; Diabetes in her sister; Heart failure in her mother; Heart murmur  in her brother, sister, and sister; Thyroid disease in her sister.    ROS:  Please see the history of present illness.   Otherwise, review of systems are positive for dry mouth.   All other systems are reviewed and negative.   PHYSICAL EXAM: VS:  There were no vitals taken for this visit. , BMI There is no height or weight on file to calculate BMI. GENERAL:  Well appearing HEENT:  Pupils equal round and reactive, fundi not visualized, oral mucosa unremarkable NECK:  No jugular venous distention, waveform within normal limits, carotid upstroke brisk and symmetric, no bruits LYMPHATICS:  No cervical adenopathy LUNGS:  Clear to auscultation bilaterally HEART:  RRR.  PMI not displaced or sustained,S1 and S2 within normal limits, no S3, no S4, no clicks, no rubs, II/VI early-peaking systolic murmur at the right upper sternal border. I/IV diastolic murmur at the left upper sternal border.  ABD:  Flat, positive bowel sounds normal in frequency in pitch, no bruits, no rebound, no guarding, no midline pulsatile mass, no hepatomegaly, no splenomegaly EXT:  2 plus pulses throughout, 1+ pitting edema, no cyanosis no clubbing SKIN:  No rashes no nodules NEURO:  Cranial nerves II through XII grossly intact, motor grossly intact throughout PSYCH:  Cognitively intact, oriented to person place and time   EKG:  EKG is not ordered today. The ekg ordered 12/31/15 shows sinus rhythm. Rate 89 bpm. 2 PVCs.  Coronary angiography 06/01/12:10-20% OM, diffuse 20-30% LAD, 10-20% D1, 20% diffuse RCA RA 10, RV 39/12, RV EDP 12, capillary wedge pressure 19, PA 39/21, mean 27, LV 116/14, LVEDP 20, aorta 115/77 Cardiac output (Fick) 6.55, cardiac index 3.2 PVR 1.7 with units, SVR 12 with units. She P/QRS ratio 1 LVEF 80%. Aortic root mildly dilated. 1-2+ aortic regurgitation.  24-hour Holter 09/26/13: Sinus rhythm, rare PACs, rare PVCs  Echo 01/22/15: LVEF 70%. Mild LVH. Mild diastolic dysfunction. Normal RV function.  Left atrium moderately enlarged. Right atrium mildly enlarged. Trace mitral regurgitation. Calcific aortic valve. Mild to moderate aortic regurgitation. Mild aortic stenosis. Peak velocity 2.1 m/s trace tricuspid regurgitation    Lexiscan Myoview 01/2016:  The left ventricular ejection fraction is normal (55-65%). The EF is 60% visually. The computer generated EF is not calculated correctly and is therefore not reported.  There was no ST segment deviation noted during stress.  The study is normal. no ischemia . no infarction   Echo 01/29/16: Study Conclusions  - Left ventricle: The cavity size was normal. Wall thickness was   increased in a pattern of moderate LVH. Systolic function was   normal. The estimated ejection fraction was in the range of 60%   to 65%. Basal inferior hypokinesis. Doppler parameters are   consistent with abnormal left ventricular relaxation (grade 1   diastolic dysfunction). - Aortic valve: Trileaflet; moderately calcified leaflets. There   was very mild stenosis. There was mild regurgitation. Mean   gradient (S): 9 mm Hg. Valve area (VTI): 1.68 cm^2. - Mitral valve: There was trivial regurgitation. - Left atrium: The atrium was mildly dilated. - Right ventricle: The cavity size was normal. Systolic function   was normal. - Tricuspid valve: Peak RV-RA gradient (S): 44 mm Hg. - Pulmonary arteries: PA peak pressure: 52 mm Hg (S). - Systemic veins: IVC measured 2.3 cm with > 50% respirophasic   variation, suggesting RA pressure 8 mmHg.  Impressions:  - Normal LV size with moderate LV hypertrophy. EF 60-65%. Basal   inferior hypokinesis. Normal RV size and systolic function. Very   mild aortic stenosis, mild aortic insufficieny. Moderate   pulmonary hypertension.   Recent Labs: 03/31/2016: ALT 15 09/18/2016: B Natriuretic Peptide 103.6 09/19/2016: BUN 18; Creatinine, Ser 1.38; Hemoglobin 12.3; Platelets 213; Potassium 3.7; Sodium 140    Lipid Panel      Component Value Date/Time   CHOL 174 11/15/2015 0952   TRIG 75 11/15/2015 0952   HDL 60 11/15/2015 0952   CHOLHDL 2.9 11/15/2015 0952   VLDL 15 11/15/2015 0952   LDLCALC 99 11/15/2015 0952      Wt Readings from Last 3 Encounters:  09/29/16 108.8 kg (239 lb 12.8 oz)  09/19/16 107 kg (235 lb 14.4 oz)  08/12/16 106.6 kg (235 lb)      ASSESSMENT AND PLAN:  # Hypertension:  BP remains poorly-controlled.  She has been taking her medication as prescribed.  We will increase carvedilol to '25mg'$  bid.  Continue losartan and nifedipine.  We will ask her to start taking lasix daily.  I have encouraged her to start exercising most days of the week as well.  She will need a BMP in 2 weeks.   # Shortness of breath: Stress test was normal and echo revealed normal systolic function with grade 1 diastolic dysfunction.  Her poorly-controlled blood pressure is likely contributing as well as central adiposity.  Continue weight loss efforts once BP is better-controlled.  She will start taking lasix daily as above.   # Aortic regurgitation # Aortic stenosis: Shannon [name] has very mild aortic stenosis (mean gradient 9 mmHg) and mild aortic regurgitation.  Continue to monitor.   Current medicines are reviewed at length with the patient today.  The patient does not have concerns regarding medicines.  The following changes have been made:  Increase carvedilol to '25mg'$ .  Take lasix daily instead of prn.   Time spent: 30 minutes-Greater than 50% of this time was spent in counseling, explanation of diagnosis, planning of further management, and coordination of care.   Labs/ tests ordered today include:   No orders of the defined types were placed in this encounter.    Disposition:  FU with Korinne Greenstein C. Oval Linsey, MD, [facility] in 2 months.  Follow up in hypertension clinic in 38month   This note was written with the assistance of speech recognition software.  Please excuse any transcriptional  errors.  Signed, Tamkia Temples C. ROval Linsey MD, FA[facility] 09/29/2016 1:25 PM    [facility]

## 2016-09-29 NOTE — Patient Instructions (Signed)
It was a pleasure meeting you today! I'm sorry you are still not feeling well.  1. I have sent in an antibiotic called Azithromycin to your [facility] pharmacy on [address]. Please take as directed on the bottle.  2. Also, I have sent in a cough syrup for you too. Please take this only as needed.  3. Follow up with cardiology and your lung doctor as already scheduled!! 4. Return to clinic if you have not improved in 2 weeks.

## 2016-09-30 NOTE — Assessment & Plan Note (Addendum)
Patient here with symptoms and exam consistent with sinusitis following viral URI symptoms. Will treat patient with a course of Azithromycin and supportive therapy. Patient to return to clinic if symptoms do not improve after 2 weeks. Also, have sent in Robtiusin DM to help with cough and congestion.

## 2016-09-30 NOTE — Assessment & Plan Note (Signed)
Normotensive today at 122/79. Pulse 86. Reports compliance with her Losartan 100 mg and Lasix prn. Will continue this regimen. Encouraged low sodium diet, continued compliance with medications and exercise.

## 2016-09-30 NOTE — Assessment & Plan Note (Signed)
Follows with infectious disease.

## 2016-10-01 ENCOUNTER — Ambulatory Visit (INDEPENDENT_AMBULATORY_CARE_PROVIDER_SITE_OTHER): Payer: Medicare Other | Admitting: Pulmonary Disease

## 2016-10-01 ENCOUNTER — Ambulatory Visit: Payer: Medicare Other | Admitting: Pulmonary Disease

## 2016-10-01 ENCOUNTER — Other Ambulatory Visit: Payer: Self-pay

## 2016-10-01 ENCOUNTER — Encounter: Payer: Self-pay | Admitting: Pulmonary Disease

## 2016-10-01 DIAGNOSIS — G4710 Hypersomnia, unspecified: Secondary | ICD-10-CM | POA: Diagnosis not present

## 2016-10-01 DIAGNOSIS — R05 Cough: Secondary | ICD-10-CM

## 2016-10-01 DIAGNOSIS — J4540 Moderate persistent asthma, uncomplicated: Secondary | ICD-10-CM | POA: Diagnosis not present

## 2016-10-01 DIAGNOSIS — R059 Cough, unspecified: Secondary | ICD-10-CM

## 2016-10-01 DIAGNOSIS — B20 Human immunodeficiency virus [HIV] disease: Secondary | ICD-10-CM | POA: Diagnosis not present

## 2016-10-01 DIAGNOSIS — G4734 Idiopathic sleep related nonobstructive alveolar hypoventilation: Secondary | ICD-10-CM

## 2016-10-01 DIAGNOSIS — K219 Gastro-esophageal reflux disease without esophagitis: Secondary | ICD-10-CM

## 2016-10-01 MED ORDER — FLUTICASONE PROPIONATE 50 MCG/ACT NA SUSP: 2.0000 | Freq: Every day | NASAL | 5 refills | Status: DC

## 2016-10-01 MED ORDER — LASIX 20 MG PO TABS: 20.0000 mg | ORAL_TABLET | Freq: Every day | ORAL | 3 refills | Status: DC | PRN

## 2016-10-01 NOTE — Assessment & Plan Note (Signed)
PPI at HS.  

## 2016-10-01 NOTE — Progress Notes (Signed)
Subjective:    Patient ID: Shannon [name], female    DOB: 1956/04/21, 61 y.o.   MRN: [id number]  HPI Pt is being seen for asthma, OSA, exertional dyspnea.   DATA PFT (12/04/15)  FEV1/FVC 50%,  FEV1  0.64  31%. Some BD response. DLCO 66%.   ROV (12/04/15) Pt returns to office after being seen in 09/2015 for hypersomnia, asthma. Has been sick with "PNA" since last seen. Stopped symbicort 2/2 heart racing.  Still with cough and congestion. She feels better with proair > uses it 3x/week. Also has anxiety attacks. Has not been admitted since last seen. HIV meds have been changed. Took bactrim and augmentin for cough/congestion.   ROV (01/02/16) Pt returns to clinic as f/u on her OSA, asthma.   Since last seen, she states she remains SOB and hoarse. Uses spiriva. She thinks she retains fluid. Saw heart MD. No recent admission. Received zpak in 11/2015. Issues using MDIs.   ROV 07/09/16 Patient follows up in the office for several issues. Since last seen, she had a sleep lab study done 04/2016 which was negative for sleep apnea but proved she needed 1 L of oxygen at bedtime. She was also seen by Tammy couple months ago. She mentioned the Pulmicort did not necessarily make a difference. She only uses albuterol when necessary and that has helped her with her asthma. She tries to avoid triggers. Recent cough related to weather change. Has not been admitted nor been on antibiotics since last seen. Feels better overall.  ROV 10/01/2016 Patient returns to the office as follow-up on her SOB and asthma. Since last seen, she was admitted last month for congestive heart failure exacerbation. She was placed on Lasix and she is improved. She continues to use her oxygen at bedtime. Uses alb less frequently.   Review of Systems  Constitutional: Negative.   HENT: Positive for congestion and postnasal drip.   Eyes: Negative.   Respiratory: Positive for cough and shortness of breath.   Cardiovascular: Negative.     Gastrointestinal: Negative.   Endocrine: Negative.   Genitourinary: Negative.   Musculoskeletal: Negative.   Allergic/Immunologic: Negative.   Neurological: Negative.   Hematological: Negative.   Psychiatric/Behavioral: Negative.   All other systems reviewed and are negative.      Objective:   Physical Exam Vitals:  Vitals:   10/01/16 0857  BP: 126/84  Pulse: 80  SpO2: 93%  Weight: 237 lb (107.5 kg)  Height: '5\' 3"'$  (1.6 m)    Constitutional/General:  Pleasant, well-nourished, well-developed, not in any distress,  Comfortably seating.  Well kempt  Body mass index is 41.98 kg/m. Wt Readings from Last 3 Encounters:  10/01/16 237 lb (107.5 kg)  09/29/16 239 lb 12.8 oz (108.8 kg)  09/19/16 235 lb 14.4 oz (107 kg)      HEENT: Pupils equal and reactive to light and accommodation. Anicteric sclerae. Normal nasal mucosa.   No oral  lesions,  mouth clear,  oropharynx clear, no postnasal drip. (-) Oral thrush. No dental caries.  Airway - Mallampati class IV  Neck: No masses. Midline trachea. No JVD, (-) LAD. (-) bruits appreciated.  Respiratory/Chest: Grossly normal chest. (-) deformity. (-) Accessory muscle use.  Symmetric expansion. (-) Tenderness on palpation.  Resonant on percussion.  Diminished BS on both lower lung zones. (-)crackles, rhonchi. (-) wheezing.  (-) egophony  Cardiovascular: Regular rate and  rhythm, heart sounds normal, no murmur or gallops,(+) peripheral edema  Gastrointestinal:  Normal bowel sounds. Soft, non-tender.  No hepatosplenomegaly.  (-) masses.   Musculoskeletal:  Normal muscle tone. Normal gait.   Extremities: Grossly normal. (-) clubbing, cyanosis.  (+)  edema  Skin: (-) rash,lesions seen.   Neurological/Psychiatric : alert, oriented to time, place, person. Normal mood and affect             Assessment & Plan:  Asthma, moderate persistent Patient is a nonsmoker. Dxed with asthma when she was in her 22s. Triggers:  extreme change in weather. Her asthma has beed unstable . She moved from [place] to [place] in Oct 2016.  PFT (12/04/15)  FEV1/FVC 50%,  FEV1  0.64  31%. Some BD response. DLCO 66%. Pt stopped symbicort since she felt her heart was racing. She states she is better with alb prn. She also does not like prednisone.  Spiriva did not help much. She has issues with MDIs.   Over all, she feels better over all with just alb prn.  Uses alb neb or MDI 1-2x/week. Asthma has been stable.   Plan : 1. Cont alb neb or MDI q4hrs prn. She did not like Pulmicort. Told her to give [place] a call if she ends up using albuterol more frequently. May need to restart Pulmicort nebulizer. 2. She is happy with her asthma. Recent mild flare 2/2 bronchitis and CHFpEF exacaerbation. Finishing abx.  3. Does not want vaccines.   Hypersomnia Patient was diagnosed with obstructive sleep apnea more than 10 years ago. She has hypersomnia, snoring, gasping, choking, fatigue. She had a sleep study done in [place]. She never had a CPAP  Study done. Her hypersomnia has gotten worse. Hypersomnia affects her functionality. -- Sleep study in 04/2016 was (-) for OSA. Needed o2 1L. Still with some hypersomnia but improved and not bothering her much.  -- Advised on good sleep hygiene. -- Advised not to engage in activities requiring vigilance if she is sleepy.       HIV disease ([place]) Currently follows with ID. VL undetectable per pt.   Cough Has UACS. cont flonase.   GERD (gastroesophageal reflux disease) PPI at HS.   Nocturnal hypoxemia Non-apneic Nocturnal hypoxemia diagnosed by sleep study 05/11/16 improved with 1L supplemental O2 via [place]. Likely secondary to underlying Asthma/ COPD She however uses 2L O2 AT hs.       Return to clinic in May.   Monica Becton, MD 10/01/2016, 9:31 AM [facility] Pager [phone] After 3 pm or if no answer, call [phone]

## 2016-10-01 NOTE — Telephone Encounter (Signed)
I have reordered Flonase with DAW

## 2016-10-01 NOTE — Assessment & Plan Note (Addendum)
Currently follows with ID. VL undetectable per pt.

## 2016-10-01 NOTE — Assessment & Plan Note (Addendum)
Patient is a nonsmoker. Dxed with asthma when she was in her 50s. Triggers: extreme change in weather. Her asthma has beed unstable . She moved from [place] to [place] in Oct 2016.  PFT (12/04/15)  FEV1/FVC 50%,  FEV1  0.64  31%. Some BD response. DLCO 66%. Pt stopped symbicort since she felt her heart was racing. She states she is better with alb prn. She also does not like prednisone.  Spiriva did not help much. She has issues with MDIs.   Over all, she feels better over all with just alb prn.  Uses alb neb or MDI 1-2x/week. Asthma has been stable.   Plan : 1. Cont alb neb or MDI q4hrs prn. She did not like Pulmicort. Told her to give [place] a call if she ends up using albuterol more frequently. May need to restart Pulmicort nebulizer. 2. She is happy with her asthma. Recent mild flare 2/2 bronchitis and CHFpEF exacaerbation. Finishing abx.  3. Does not want vaccines.

## 2016-10-01 NOTE — Assessment & Plan Note (Addendum)
Patient was diagnosed with obstructive sleep apnea more than 10 years ago. She has hypersomnia, snoring, gasping, choking, fatigue. She had a sleep study done in [place]. She never had a CPAP  Study done. Her hypersomnia has gotten worse. Hypersomnia affects her functionality. -- Sleep study in 04/2016 was (-) for OSA. Needed o2 1L. Still with some hypersomnia but improved and not bothering her much.  -- Advised on good sleep hygiene. -- Advised not to engage in activities requiring vigilance if she is sleepy.      

## 2016-10-01 NOTE — Telephone Encounter (Signed)
Correction: For furosemide brand name is Lasix, I have ordered this DAW, I reordered Flonase as substitution permitted.

## 2016-10-01 NOTE — Telephone Encounter (Signed)
Pt calls and request name brand furosemide, she states her pulmonologist told her she needed name brand, could you send a new script for name brand?

## 2016-10-01 NOTE — Assessment & Plan Note (Signed)
Has UACS. cont flonase.

## 2016-10-01 NOTE — Telephone Encounter (Signed)
Please call pt back regarding meds. Please call back.

## 2016-10-01 NOTE — Assessment & Plan Note (Addendum)
Non-apneic Nocturnal hypoxemia diagnosed by sleep study 05/11/16 improved with 1L supplemental O2 via [place]. Likely secondary to underlying Asthma/ COPD She however uses 2L O2 AT hs.   

## 2016-10-01 NOTE — Patient Instructions (Signed)
It was a pleasure taking care of you today!  You are diagnosed with Asthma.   Asthma is a chronic disease that affects the airways of your lungs.When you have asthma, your airways become swollen. The swelling causes the airways to make thick, sticky secretions called mucus.   Asthma also causes the muscles in and around your airways to get very tight.  This swelling, mucus, and tight muscles can make your airways narrower than normal and it becomes very hard for you to get air into and out of your lungs.   Sometimes, when you have a lung infection, this can make your breathing worse, and will cause you to  have an asthma flare-up. Please call your primary care doctor or the office if you are having an asthma flare-up.   Smoking makes asthma worse.   Make sure you use your rescue medications for asthma --  Rescue medications: Albuterol 2 puffs every 4 hours as needed for shortness of breath.   Please rinse your mouth each time you use your maintenance medication.   Please call the office if you are having issues with your medications  Return to clinic in May with Dr. Corrie Dandy

## 2016-10-01 NOTE — Progress Notes (Signed)
Internal Medicine Clinic Attending  I saw and evaluated the patient.  I personally confirmed the key portions of the history and exam documented by Dr. Molt and I reviewed pertinent patient test results.  The assessment, diagnosis, and plan were formulated together and I agree with the documentation in the resident's note. 

## 2016-10-05 DIAGNOSIS — R0602 Shortness of breath: Secondary | ICD-10-CM | POA: Diagnosis not present

## 2016-10-08 ENCOUNTER — Telehealth: Payer: Self-pay | Admitting: *Deleted

## 2016-10-13 DIAGNOSIS — G4733 Obstructive sleep apnea (adult) (pediatric): Secondary | ICD-10-CM | POA: Diagnosis not present

## 2016-10-13 DIAGNOSIS — J189 Pneumonia, unspecified organism: Secondary | ICD-10-CM | POA: Diagnosis not present

## 2016-10-13 NOTE — Telephone Encounter (Addendum)
PA for Lasix was sent to CoverMyMeds .  Awaiting decision in 72 hours.  Sander Nephew, RN 10/13/2016 9:56 AM Message to be sent to Dr. Heber Heyworth to see if patient can take the perferred drug of Furosemide,  Bumttanide or torsemind. Sander Nephew, RN 2/21/209 2:20

## 2016-10-14 ENCOUNTER — Encounter: Payer: Self-pay | Admitting: Internal Medicine

## 2016-10-14 ENCOUNTER — Telehealth: Payer: Self-pay | Admitting: Internal Medicine

## 2016-10-14 NOTE — Telephone Encounter (Signed)
APT. REMINDER CALL, LMTCB °

## 2016-10-15 ENCOUNTER — Encounter: Payer: Medicare Other | Admitting: Internal Medicine

## 2016-10-15 ENCOUNTER — Other Ambulatory Visit: Payer: Self-pay | Admitting: Internal Medicine

## 2016-10-16 NOTE — Telephone Encounter (Signed)
Unfortunately Shannon [name] missed her appointment with me yesterday, I do not have any documentation on why she cannot tolerate the generic version or the alternatives listed, in this case I would recommend she try the generic version first.

## 2016-10-19 ENCOUNTER — Telehealth: Payer: Self-pay | Admitting: *Deleted

## 2016-10-19 MED ORDER — FUROSEMIDE 20 MG PO TABS: 20.0000 mg | ORAL_TABLET | Freq: Every day | ORAL | 3 refills | Status: DC | PRN

## 2016-10-19 NOTE — Telephone Encounter (Signed)
I do not have any documentation that she has tried and failed generic furosemide, I was hoping to discuss this with Shannon [name] at our visit last Thursday, unfortunately she "no showed" to the visit.    Therefore I have reordered her furosemide as generic.

## 2016-10-19 NOTE — Telephone Encounter (Signed)
Information was placed in Dr. Jodene Nam box for review on Lasix prescription.  Lasix is not on the formulary for [employer] and is not covered by [employer].  Patient will need to try one of the alternatives Furosemide, Torsemide or Bumetanide.  Will send message to Dr. Heber Henderson for review.  Sander Nephew, RN 10/19/2016 1:37 PM.

## 2016-10-20 ENCOUNTER — Encounter: Payer: Self-pay | Admitting: Internal Medicine

## 2016-10-20 ENCOUNTER — Ambulatory Visit (INDEPENDENT_AMBULATORY_CARE_PROVIDER_SITE_OTHER): Payer: Medicare Other | Admitting: Internal Medicine

## 2016-10-20 DIAGNOSIS — Z9981 Dependence on supplemental oxygen: Secondary | ICD-10-CM | POA: Diagnosis not present

## 2016-10-20 DIAGNOSIS — R0902 Hypoxemia: Secondary | ICD-10-CM

## 2016-10-20 DIAGNOSIS — F411 Generalized anxiety disorder: Secondary | ICD-10-CM

## 2016-10-20 MED ORDER — CITALOPRAM HYDROBROMIDE 10 MG PO TABS: 10.0000 mg | ORAL_TABLET | Freq: Every day | ORAL | 2 refills | Status: DC

## 2016-10-20 NOTE — Progress Notes (Signed)
   CC: anxiety  HPI: Shannon [name] is a 61 y.o. with past medical history as outlined below who presents to clinic with concern for anxiety.  She describes a history of generalized anxiety previously treated with alprazolam. She feels as though she has difficulty in the evenings. She is not able to settle down until 2 am and is working on school work and paying bills. She does not have an evening routine and takes melatonin just before she gets ready to go to bed. She sleeps from 2 am to 6 am when she has to wake up to urinate. In the morning she feels un rested. She does not take naps through the day. She has had a sleep study which showed that she has night time hypoxia but she did not have any apneic episodes. She uses oxygen nasal canula at night.   Please see problem list for status of the pt's chronic medical problems.  Past Medical History:  Diagnosis Date  . Anemia   . Anxiety    HX PANIC ATTACKS  . Arthritis    "starting to; in my hands" (07/09/2015)  . Chronic asthma with acute exacerbation    "I have chronic asthma all the time; sometimes exacerbations" (07/09/2015)  . Chronic lower back pain   . Cyst of right kidney    "3 of them; dx'd in ~ 01/2015"  . GERD (gastroesophageal reflux disease)   . Heart murmur   . History of blood transfusion    "related to my brain surgery I think"  . History of pulmonary embolism 07/09/2015  . HIV antibody positive ([place])   . HIV disease ([place])   . Hyperlipidemia   . Hypertension   . Lipodystrophy   . Pneumonia 07/09/2015  . Sleep apnea    "never completed part 2 of study; never wore mask" (07/09/2015)    Review of Systems:  Please see each problem below for a pertinent review of systems.  Physical Exam:  Vitals:   10/20/16 0940  BP: 134/86  Pulse: 87  Temp: 98.3 F (36.8 C)  TempSrc: Oral  SpO2: 94%  Weight: 236 lb 1.6 oz (107.1 kg)   Physical Exam  Constitutional: She appears well-developed and well-nourished. No  distress.  HENT:  Head: Normocephalic and atraumatic.  Eyes: Conjunctivae are normal. No scleral icterus.  Pulmonary/Chest: No respiratory distress. She has no wheezes. She has no rales.  Neurological: She is alert.  Skin: She is not diaphoretic.  Psychiatric: Her mood appears anxious. She is agitated.  GAD-7 score = 9    Assessment & Plan:   See Encounters Tab for problem based charting.  Anxiety  GAD-7 score =9 correlates with mild-moderate generalized anxiety. She is open to trying a longer acting agent. I am curious if this anxiety may be more related to current stress but will try treating for GAD.  - prescribed citalopram 10 mg daily  - RTC in 1 month    Patient discussed with Dr. Lynnae January

## 2016-10-20 NOTE — Telephone Encounter (Signed)
Spoke to patient about need for Brand Name Lasix.  Patient was asked why she needed the brand name instead of the generic.  Patient said that the Furosmide does not work as well and she needs to increase to 2 to 3 tablets to get the same effects. Patient complained of increased swelling when she takes has to take the generic Furosemide.   Patient was advised that I would inform Dr. Heber [place] but that she needs to see Dr. Heber [place] for an appointment  to discuss the medication.  Patient to make an appointment with Dr. Heber [place]. Sander Nephew, RN 10/20/2016 11:15 AM

## 2016-10-20 NOTE — Patient Instructions (Addendum)
It was a pleasure to meet you today Shannon [name],   Today we talked about your anxiety, try taking this new medication citalopram, it probably wont work right away but should gradually help your anxiety  Please schedule a follow up appointment in 1 month   Sleep hygiene education-Sleep hygiene teaches good sleeping habits (table 1). This includes: ?Sleep only as much as necessary to feel rested and then get out of bed. ?Maintain a regular sleep schedule (the same bedtime and wake time every day). ?Do not force sleep. (See 'Stimulus control' below.) ?Avoid caffeinated beverages after lunch. ?Avoid alcohol near bedtime. ?Do not smoke (particularly during the evening). ?Do not go to bed hungry. ?Adjust the bedroom environment (light, noise, temperature) so that you are comfortable before you lie down. ?Deal with concerns or worries before bedtime. Make a list of things to work on for the next day so anxiety is reduced at night. ?Exercise regularly, preferably four or more hours before bedtime. ?Avoid prolonged use of phones or reading devices ("e-books") that give off light before bed. This can make it harder to fall asleep. Relaxation-Relaxation therapy involves progressively relaxing your muscles from your head down to your feet. Here is a sample of a relaxation program: Beginning with the muscles in your face, squeeze (contract) your muscles gently for one to two seconds and then relax. Repeat several times. Use the same technique for other muscle groups, usually in the following sequence: jaw and neck, shoulders, upper arms, lower arms, fingers, chest, abdomen, buttocks, thighs, calves, and feet. Repeat this cycle for 45 minutes, if necessary. This relaxation program can promote restfulness and sleep. Relaxation therapy is sometimes combined with biofeedback.  Stimulus control-Stimulus control therapy is based on the idea that some people with insomnia have learned to associate the bedroom  with staying awake rather than sleeping. ?You should spend no more than 20 minutes lying in bed trying to fall asleep. ?If you cannot fall asleep within 20 minutes, get up, go to another room and read or find another relaxing activity until you feel sleepy again. Activities such as eating, balancing your checkbook, doing housework, watching TV, or studying for a test, which "reward" you for staying awake, should be avoided. ?When you start to feel sleepy, you can return to bed. If you cannot fall asleep in another 20 minutes, repeat the process. ?Set an alarm clock and get up at the same time every day, including weekends. ?Do not take a nap during the day. You may not sleep much on the first night. However, sleep is more likely on succeeding nights because sleepiness is increased and naps are not allowed.

## 2016-10-22 DIAGNOSIS — F411 Generalized anxiety disorder: Secondary | ICD-10-CM | POA: Insufficient documentation

## 2016-10-22 NOTE — Progress Notes (Signed)
Internal Medicine Clinic Attending  Case discussed with Dr. Blum at the time of the visit.  We reviewed the resident's history and exam and pertinent patient test results.  I agree with the assessment, diagnosis, and plan of care documented in the resident's note. 

## 2016-10-22 NOTE — Assessment & Plan Note (Signed)
She describes a history of generalized anxiety previously treated with alprazolam. She feels as though she has difficulty in the evenings. She is not able to settle down until 2 am and is working on school work and paying bills. She does not have an evening routine and takes melatonin just before she gets ready to go to bed. She sleeps from 2 am to 6 am when she has to wake up to urinate. In the morning she feels un rested. She does not take naps through the day. She has had a sleep study which showed that she has night time hypoxia but she did not have any apneic episodes. She uses oxygen nasal canula at night.   GAD-7 score =9 correlates with mild-moderate generalized anxiety. She is open to trying a longer acting agent. I am curious if this anxiety may be more related to current stress but will try treating for GAD.  - prescribed citalopram 10 mg daily  - RTC in 1 month

## 2016-10-27 ENCOUNTER — Telehealth: Payer: Self-pay | Admitting: Cardiovascular Disease

## 2016-10-27 ENCOUNTER — Ambulatory Visit: Payer: Medicare Other | Admitting: Internal Medicine

## 2016-10-27 ENCOUNTER — Ambulatory Visit: Payer: Medicare Other | Admitting: Cardiovascular Disease

## 2016-10-27 NOTE — Progress Notes (Deleted)
Cardiology Office Note   Date:  10/27/2016   ID:  [name] Shannon, DOB February 02, 1956, MRN [id number]  PCP:  Lucious Groves, DO  Cardiologist:   Skeet Latch, MD  ID: Shannon Basques, MD  No chief complaint on file.    History of Present Illness: Shannon [name] is a 61 y.o. female with hypertension, asthma, non-obstructive CAD, OSA, prior PE and HIV (well-controlled)  who presents for follow up.  [name] was initially referred by her infectious disease doctor, Shannon Basques, MD, on 12/26/15. [name] was concerned about her lower extremity edema and shortness of breath.  She was started on lasix 20 mg every other day.  [name] noted shortness of breath since her hospitalization for pneumonia 06/2015.   She denied lower extremity edema or orthopnea but did report chest pain when laying down at night.  She was referred for an echo 01/2016 that revealed LVEF 60-65% awith grade 1 diastolic dysfunction and hypokinesis of the basal inferior wall.  She also had very mild aortic stenosis with a mean gradient of 9 mmHg and trivial AR.  PASP was 52 mmHg.  She had a Lexiscan Myoview at that time that was negative for ischemia.  [name] previously had a Lexiscan Cardiolite in September 2013 that revealed a medium, mild reversible defect in the anterior wall and normal systolic function. She subsequently underwent cardiac catheterization that revealed nonobstructive coronary disease. She had mild pulmonary hypertension and a normal cardiac output. Left ventriculography revealed mild to moderate aortic regurgitation. She had an echo May 2016 that revealed an ejection fraction of 81% and mild diastolic dysfunction. It also revealed calcification of the aortic valve, mild to moderate aortic regurgitation, and mild aortic stenosis. Peak velocity was 2.1 m/s. She had a 24-hour Holter in February 2015 that revealed rare PACs and PVCs.  [name] has struggled with blood pressure control.  Since her last appointment  her blood pressure has been better-controlled.  At home and at her clinic appointments it has been running in the 157W-620B systolic.  She reports taking her medication as instructed.  She does endorse some increased salt intake over Thanksgiving.  She is scheduled for removal of a ganglion cyst on 07/29/16.  [name] notes that she hasn't been sleeping well since her trip to [place].  She also continues to note She hasn't been sleeping well for the last week. She is being treated with Amoxicillin for an URI and endorses uses over the counter ocld and cough medication.   [name] was admitted to the hospital 08/2016 due to dyspnea.  No infectious source was found and she was not volume overloaded at the time. She was noted to have mild hypoxia at rest. It was felt to be due to a viral upper respiratory infection and a possible flare of asthma/COPD.   Past Medical History:  Diagnosis Date  . Anemia   . Anxiety    HX PANIC ATTACKS  . Arthritis    "starting to; in my hands" (07/09/2015)  . Chronic asthma with acute exacerbation    "I have chronic asthma all the time; sometimes exacerbations" (07/09/2015)  . Chronic lower back pain   . Cyst of right kidney    "3 of them; dx'd in ~ 01/2015"  . GERD (gastroesophageal reflux disease)   . Heart murmur   . History of blood transfusion    "related to my brain surgery I think"  . History of pulmonary embolism  07/09/2015  . HIV antibody positive (Shannon [name])   . HIV disease (Shannon [name])   . Hyperlipidemia   . Hypertension   . Lipodystrophy   . Pneumonia 07/09/2015  . Sleep apnea    "never completed part 2 of study; never wore mask" (07/09/2015)    Past Surgical History:  Procedure Laterality Date  . ABDOMINAL HYSTERECTOMY     "robotic laparosopic"  . BRAIN SURGERY  1974   "brain tumor; benign; on top of my brain; got a plate in there"  . CARDIAC CATHETERIZATION    . TONSILLECTOMY AND ADENOIDECTOMY       Current Outpatient Prescriptions  Medication  Sig Dispense Refill  . acetaminophen (TYLENOL) 500 MG tablet Take 500 mg by mouth every 6 (six) hours as needed for mild pain.     [name] albuterol (PROAIR HFA) 108 (90 Base) MCG/ACT inhaler Inhale 2 puffs into the lungs every 4 (four) hours as needed for wheezing or shortness of breath. 6.7 g 3  . albuterol (PROVENTIL) (2.5 MG/3ML) 0.083% nebulizer solution Take 3 mLs (2.5 mg total) by nebulization every 6 (six) hours as needed for wheezing or shortness of breath. 75 mL 3  . budesonide (PULMICORT) 0.25 MG/2ML nebulizer solution Take 2 mLs (0.25 mg total) by nebulization 2 (two) times daily. 60 mL 12  . carvedilol (COREG) 25 MG tablet Take 1 tablet (25 mg total) by mouth 2 (two) times daily. (Patient taking differently: Take 25 mg by mouth daily. ) 60 tablet 5  . citalopram (CELEXA) 10 MG tablet Take 1 tablet (10 mg total) by mouth daily. 30 tablet 2  . cyanocobalamin (,VITAMIN B-12,) 1000 MCG/ML injection Inject 1 mL (1,000 mcg total) into the muscle every 30 (thirty) days. 1 mL 3  . dolutegravir (TIVICAY) 50 MG tablet Take 1 tablet (50 mg total) by mouth daily. 30 tablet 11  . emtricitabine-tenofovir AF (DESCOVY) 200-25 MG tablet Take 1 tablet by mouth daily. 30 tablet 11  . fluticasone (FLONASE) 50 MCG/ACT nasal spray Place 2 sprays into both nostrils daily. 16 g 5  . furosemide (LASIX) 20 MG tablet Take 1 tablet (20 mg total) by mouth daily as needed for fluid or edema. 30 tablet 3  . guaiFENesin (MUCINEX) 600 MG 12 hr tablet Take 1 tablet (600 mg total) by mouth 2 (two) times daily. (Patient taking differently: Take 600 mg by mouth 2 (two) times daily as needed for cough or to loosen phlegm. ) 60 tablet 0  . guaiFENesin-dextromethorphan (ROBITUSSIN DM) 100-10 MG/5ML syrup Take 5 mLs by mouth every 4 (four) hours as needed for cough. 118 mL 0  . loratadine (CLARITIN) 10 MG tablet Take 1 tablet (10 mg total) by mouth daily as needed for allergies. 30 tablet 2  . losartan (COZAAR) 100 MG tablet Take 100  mg by mouth daily.    . Multiple Vitamin (MULTIVITAMIN WITH MINERALS) TABS tablet Take 1 tablet by mouth daily. (Patient taking differently: Take 1 tablet by mouth daily as needed (immune support). ) 90 tablet   . NIFEdipine (PROCARDIA XL/ADALAT-CC) 60 MG 24 hr tablet Take 1 tablet (60 mg total) by mouth daily. 30 tablet 11  . oseltamivir (TAMIFLU) 30 MG capsule Take 1 capsule (30 mg total) by mouth 2 (two) times daily. 10 capsule 0  . POTASSIUM PO Take 1 tablet by mouth daily as needed. With lasix    . PREMARIN vaginal cream Place 1 application vaginally once a week.    . SYRINGE/NEEDLE, DISP, 1 ML (B-D SYRINGE/NEEDLE  1CC/25GX5/8) 25G X 5/8" 1 ML MISC 1 Units by Does not apply route every 30 (thirty) days. (Patient not taking: Reported on 09/18/2016) 50 each 0  . Tetrahydrozoline HCl (VISINE OP) Place 2 drops into both eyes as needed (for dry eyes).    . vitamin C (ASCORBIC ACID) 500 MG tablet Take 2 tablets (1,000 mg total) by mouth daily. (Patient taking differently: Take 500 mg by mouth daily as needed (immune support). ) 180 tablet 0   No current facility-administered medications for this visit.    Facility-Administered Medications Ordered in Other Visits  Medication Dose Route Frequency Provider Last Rate Last Dose  . technetium tetrofosmin (TC-MYOVIEW) injection 29.9 milli Curie  42.3 millicurie Intravenous Once PRN Dorothy Spark, MD        Allergies:   Tree extract and Ciprofloxacin    Social History:  The patient  reports that she has never smoked. She has never used smokeless tobacco. She reports that she drinks alcohol. She reports that she does not use drugs.   Family History:  The patient's family history includes Asthma in her mother; Diabetes in her sister; Heart failure in her mother; Heart murmur in her brother, sister, and sister; Thyroid disease in her sister.    ROS:  Please see the history of present illness.   Otherwise, review of systems are positive for dry mouth.    All other systems are reviewed and negative.   PHYSICAL EXAM: VS:  There were no vitals taken for this visit. , BMI There is no height or weight on file to calculate BMI. GENERAL:  Well appearing HEENT:  Pupils equal round and reactive, fundi not visualized, oral mucosa unremarkable NECK:  No jugular venous distention, waveform within normal limits, carotid upstroke brisk and symmetric, no bruits LYMPHATICS:  No cervical adenopathy LUNGS:  Clear to auscultation bilaterally HEART:  RRR.  PMI not displaced or sustained,S1 and S2 within normal limits, no S3, no S4, no clicks, no rubs, II/VI early-peaking systolic murmur at the right upper sternal border. I/IV diastolic murmur at the left upper sternal border.  ABD:  Flat, positive bowel sounds normal in frequency in pitch, no bruits, no rebound, no guarding, no midline pulsatile mass, no hepatomegaly, no splenomegaly EXT:  2 plus pulses throughout, 1+ pitting edema, no cyanosis no clubbing SKIN:  No rashes no nodules NEURO:  Cranial nerves II through XII grossly intact, motor grossly intact throughout PSYCH:  Cognitively intact, oriented to person place and time   EKG:  EKG is not ordered today. The ekg ordered 12/31/15 shows sinus rhythm. Rate 89 bpm. 2 PVCs.  Coronary angiography 06/01/12:10-20% OM, diffuse 20-30% LAD, 10-20% D1, 20% diffuse RCA RA 10, RV 39/12, RV EDP 12, capillary wedge pressure 19, PA 39/21, mean 27, LV 116/14, LVEDP 20, aorta 115/77 Cardiac output (Fick) 6.55, cardiac index 3.2 PVR 1.7 with units, SVR 12 with units. She P/QRS ratio 1 LVEF 80%. Aortic root mildly dilated. 1-2+ aortic regurgitation.  24-hour Holter 09/26/13: Sinus rhythm, rare PACs, rare PVCs  Echo 01/22/15: LVEF 70%. Mild LVH. Mild diastolic dysfunction. Normal RV function. Left atrium moderately enlarged. Right atrium mildly enlarged. Trace mitral regurgitation. Calcific aortic valve. Mild to moderate aortic regurgitation. Mild aortic stenosis. Peak  velocity 2.1 m/s trace tricuspid regurgitation    Lexiscan Myoview 01/2016:   The left ventricular ejection fraction is normal (55-65%). The EF is 60% visually. The computer generated EF is not calculated correctly and is therefore not reported.  There was  no ST segment deviation noted during stress.  The study is normal. no ischemia . no infarction   Echo 01/29/16: Study Conclusions  - Left ventricle: The cavity size was normal. Wall thickness was   increased in a pattern of moderate LVH. Systolic function was   normal. The estimated ejection fraction was in the range of 60%   to 65%. Basal inferior hypokinesis. Doppler parameters are   consistent with abnormal left ventricular relaxation (grade 1   diastolic dysfunction). - Aortic valve: Trileaflet; moderately calcified leaflets. There   was very mild stenosis. There was mild regurgitation. Mean   gradient (S): 9 mm Hg. Valve area (VTI): 1.68 cm^2. - Mitral valve: There was trivial regurgitation. - Left atrium: The atrium was mildly dilated. - Right ventricle: The cavity size was normal. Systolic function   was normal. - Tricuspid valve: Peak RV-RA gradient (S): 44 mm Hg. - Pulmonary arteries: PA peak pressure: 52 mm Hg (S). - Systemic veins: IVC measured 2.3 cm with > 50% respirophasic   variation, suggesting RA pressure 8 mmHg.  Impressions:  - Normal LV size with moderate LV hypertrophy. EF 60-65%. Basal   inferior hypokinesis. Normal RV size and systolic function. Very   mild aortic stenosis, mild aortic insufficieny. Moderate   pulmonary hypertension.   Recent Labs: 03/31/2016: ALT 15 09/18/2016: B Natriuretic Peptide 103.6 09/19/2016: BUN 18; Creatinine, Ser 1.38; Hemoglobin 12.3; Platelets 213; Potassium 3.7; Sodium 140    Lipid Panel    Component Value Date/Time   CHOL 174 11/15/2015 0952   TRIG 75 11/15/2015 0952   HDL 60 11/15/2015 0952   CHOLHDL 2.9 11/15/2015 0952   VLDL 15 11/15/2015 0952   LDLCALC 99  11/15/2015 0952      Wt Readings from Last 3 Encounters:  10/20/16 107.1 kg (236 lb 1.6 oz)  10/01/16 107.5 kg (237 lb)  09/29/16 108.8 kg (239 lb 12.8 oz)      ASSESSMENT AND PLAN:  # Hypertension:  BP remains poorly-controlled.  She has been taking her medication as prescribed.  We will increase carvedilol to '25mg'$  bid.  Continue losartan and nifedipine.  We will ask her to start taking lasix daily.  I have encouraged her to start exercising most days of the week as well.  She will need a BMP in 2 weeks.   # Shortness of breath: Stress test was normal and echo revealed normal systolic function with grade 1 diastolic dysfunction.  Her poorly-controlled blood pressure is likely contributing as well as central adiposity.  Continue weight loss efforts once BP is better-controlled.  She will start taking lasix daily as above.   # Aortic regurgitation # Aortic stenosis: Ms. Penn has very mild aortic stenosis (mean gradient 9 mmHg) and mild aortic regurgitation.  Continue to monitor.   Current medicines are reviewed at length with the patient today.  The patient does not have concerns regarding medicines.  The following changes have been made:  Increase carvedilol to '25mg'$ .  Take lasix daily instead of prn.   Time spent: 30 minutes-Greater than 50% of this time was spent in counseling, explanation of diagnosis, planning of further management, and coordination of care.   Labs/ tests ordered today include:   No orders of the defined types were placed in this encounter.    Disposition:   FU with Sherel Fennell C. Oval Linsey, MD, [facility] in 2 months.  Follow up in hypertension clinic in 35month   This note was written with the assistance of speech recognition  software.  Please excuse any transcriptional errors.  Signed, Zalma Channing C. Oval Linsey, MD, [facility]  10/27/2016 5:50 AM    [place]

## 2016-10-27 NOTE — Telephone Encounter (Signed)
New message    Pt c/o swelling: STAT is pt has developed SOB within 24 hours  1. How long have you been experiencing swelling? Started last night  2. Where is the swelling located? Pt states her body  3.  Are you currently taking a "fluid pill"? yes  4.  Are you currently SOB? Yes-no more than normal  5.  Have you traveled recently? No   Pt states she believes the swelling is from the medicine she takes.

## 2016-10-27 NOTE — Telephone Encounter (Signed)
Pt states that he normal weight is 230-233, her weight today is 228.2. Since her weight is down she will not today. She states that her heart is racing, and she has not slept. Pt is having a panic attack. Pt states that she had appt with her PCP last week and he started her on citalopram '10mg'$  she states that she takes this as needed only. she states that she feels better now and will discuss this at her appt tomorrow with Dr Oval Linsey. Pt states thank you and nothing else is needed.

## 2016-10-28 ENCOUNTER — Ambulatory Visit (INDEPENDENT_AMBULATORY_CARE_PROVIDER_SITE_OTHER): Payer: Medicare Other | Admitting: Cardiovascular Disease

## 2016-10-28 ENCOUNTER — Encounter: Payer: Self-pay | Admitting: Cardiovascular Disease

## 2016-10-28 VITALS — BP 152/94 | HR 93 | Ht 63.5 in | Wt 232.0 lb

## 2016-10-28 DIAGNOSIS — I10 Essential (primary) hypertension: Secondary | ICD-10-CM | POA: Diagnosis not present

## 2016-10-28 DIAGNOSIS — I351 Nonrheumatic aortic (valve) insufficiency: Secondary | ICD-10-CM

## 2016-10-28 DIAGNOSIS — I350 Nonrheumatic aortic (valve) stenosis: Secondary | ICD-10-CM | POA: Diagnosis not present

## 2016-10-28 MED ORDER — LOSARTAN POTASSIUM-HCTZ 100-25 MG PO TABS: 1.0000 | ORAL_TABLET | Freq: Every day | ORAL | 5 refills | Status: DC

## 2016-10-28 MED ORDER — AZITHROMYCIN 250 MG PO TABS: ORAL_TABLET | ORAL | 0 refills | Status: DC

## 2016-10-28 NOTE — Progress Notes (Signed)
Cardiology Office Note   Date:  10/28/2016   ID:  Shannon [name], DOB December 14, 1955, MRN [id number]  PCP:  Lucious Groves, DO  Cardiologist:   Skeet Latch, MD  ID: [name], MD  Chief Complaint  Patient presents with  . Follow-up    2 months;      History of Present Illness: Shannon [name] is a 61 y.o. female with hypertension, asthma, non-obstructive CAD, OSA, prior PE and HIV (well-controlled)  who presents for follow up.  Shannon [name] was initially referred by her infectious disease doctor, [name], MD, on 12/26/15. [name] was concerned about her lower extremity edema and shortness of breath.  She was started on lasix 20 mg every other day.  Shannon [name] noted shortness of breath since her hospitalization for pneumonia 06/2015.   She denied lower extremity edema or orthopnea but did report chest pain when laying down at night.  She was referred for an echo 01/2016 that revealed LVEF 60-65% awith grade 1 diastolic dysfunction and hypokinesis of the basal inferior wall.  She also had very mild aortic stenosis with a mean gradient of 9 mmHg and trivial AR.  PASP was 52 mmHg.  She had a Lexiscan Myoview at that time that was negative for ischemia.  Shannon [name] previously had a Lexiscan Cardiolite in September 2013 that revealed a medium, mild reversible defect in the anterior wall and normal systolic function. She subsequently underwent cardiac catheterization that revealed nonobstructive coronary disease. She had mild pulmonary hypertension and a normal cardiac output. Left ventriculography revealed mild to moderate aortic regurgitation. She had an echo May 2016 that revealed an ejection fraction of 00% and mild diastolic dysfunction. It also revealed calcification of the aortic valve, mild to moderate aortic regurgitation, and mild aortic stenosis. Peak velocity was 2.1 m/s. She had a 24-hour Holter in February 2015 that revealed rare PACs and PVCs.  Shannon [name] has struggled with blood  pressure control.  She was admitted to the hospital 08/2016 due to dyspnea.  No infectious source was found and she was not volume overloaded at the time. She was noted to have mild hypoxia at rest. It was felt to be due to a viral upper respiratory infection and a possible flare of asthma/COPD.  She has been going to [employer] and finished her first semester.  She had two anxiety attacks prior to the midterms.  She notes that her BP has been more elevated due to stress.  She is excited that she passed her midterms.  She hasn't been sleeping due to stress and homework.  Her BP has been as high as 938 systolic.  She also reports sinus congestion and drainage for the last two weeks.  She denies chest pain, shortness of breath, lower extremity edema, orthopnea or PND.   Past Medical History:  Diagnosis Date  . Anemia   . Anxiety    HX PANIC ATTACKS  . Arthritis    "starting to; in my hands" (07/09/2015)  . Chronic asthma with acute exacerbation    "I have chronic asthma all the time; sometimes exacerbations" (07/09/2015)  . Chronic lower back pain   . Cyst of right kidney    "3 of them; dx'd in ~ 01/2015"  . GERD (gastroesophageal reflux disease)   . Heart murmur   . History of blood transfusion    "related to my brain surgery I think"  . History of pulmonary embolism 07/09/2015  . HIV antibody positive ([place])   .  HIV disease ([place])   . Hyperlipidemia   . Hypertension   . Lipodystrophy   . Pneumonia 07/09/2015  . Sleep apnea    "never completed part 2 of study; never wore mask" (07/09/2015)    Past Surgical History:  Procedure Laterality Date  . ABDOMINAL HYSTERECTOMY     "robotic laparosopic"  . BRAIN SURGERY  1974   "brain tumor; benign; on top of my brain; got a plate in there"  . CARDIAC CATHETERIZATION    . TONSILLECTOMY AND ADENOIDECTOMY       Current Outpatient Prescriptions  Medication Sig Dispense Refill  . acetaminophen (TYLENOL) 500 MG tablet Take 500 mg by mouth every 6  (six) hours as needed for mild pain.     [name] albuterol (PROAIR HFA) 108 (90 Base) MCG/ACT inhaler Inhale 2 puffs into the lungs every 4 (four) hours as needed for wheezing or shortness of breath. 6.7 g 3  . albuterol (PROVENTIL) (2.5 MG/3ML) 0.083% nebulizer solution Take 3 mLs (2.5 mg total) by nebulization every 6 (six) hours as needed for wheezing or shortness of breath. 75 mL 3  . budesonide (PULMICORT) 0.25 MG/2ML nebulizer solution Take 2 mLs (0.25 mg total) by nebulization 2 (two) times daily. 60 mL 12  . carvedilol (COREG) 25 MG tablet Take 1 tablet (25 mg total) by mouth 2 (two) times daily. (Patient taking differently: Take 25 mg by mouth daily. ) 60 tablet 5  . citalopram (CELEXA) 10 MG tablet Take 1 tablet (10 mg total) by mouth daily. 30 tablet 2  . cyanocobalamin (,VITAMIN B-12,) 1000 MCG/ML injection Inject 1 mL (1,000 mcg total) into the muscle every 30 (thirty) days. 1 mL 3  . dolutegravir (TIVICAY) 50 MG tablet Take 1 tablet (50 mg total) by mouth daily. 30 tablet 11  . emtricitabine-tenofovir AF (DESCOVY) 200-25 MG tablet Take 1 tablet by mouth daily. 30 tablet 11  . fluticasone (FLONASE) 50 MCG/ACT nasal spray Place 2 sprays into both nostrils daily. 16 g 5  . guaiFENesin (MUCINEX) 600 MG 12 hr tablet Take 1 tablet (600 mg total) by mouth 2 (two) times daily. (Patient taking differently: Take 600 mg by mouth 2 (two) times daily as needed for cough or to loosen phlegm. ) 60 tablet 0  . loratadine (CLARITIN) 10 MG tablet Take 1 tablet (10 mg total) by mouth daily as needed for allergies. 30 tablet 2  . Multiple Vitamin (MULTIVITAMIN WITH MINERALS) TABS tablet Take 1 tablet by mouth daily. (Patient taking differently: Take 1 tablet by mouth daily as needed (immune support). ) 90 tablet   . NIFEdipine (PROCARDIA XL/ADALAT-CC) 60 MG 24 hr tablet Take 1 tablet (60 mg total) by mouth daily. 30 tablet 11  . POTASSIUM PO Take 1 tablet by mouth daily as needed. With lasix    . PREMARIN vaginal  cream Place 1 application vaginally once a week.    . SYRINGE/NEEDLE, DISP, 1 ML (B-D SYRINGE/NEEDLE 1CC/25GX5/8) 25G X 5/8" 1 ML MISC 1 Units by Does not apply route every 30 (thirty) days. 50 each 0  . Tetrahydrozoline HCl (VISINE OP) Place 2 drops into both eyes as needed (for dry eyes).    . vitamin C (ASCORBIC ACID) 500 MG tablet Take 2 tablets (1,000 mg total) by mouth daily. (Patient taking differently: Take 500 mg by mouth daily as needed (immune support). ) 180 tablet 0  . azithromycin (ZITHROMAX Z-PAK) 250 MG tablet 2 TABLETS BY MOUTH ON DAY ONE AND THEN 1 DAILY UNTIL FINISHED  6 each 0  . losartan-hydrochlorothiazide (HYZAAR) 100-25 MG tablet Take 1 tablet by mouth daily. 30 tablet 5   No current facility-administered medications for this visit.    Facility-Administered Medications Ordered in Other Visits  Medication Dose Route Frequency Provider Last Rate Last Dose  . technetium tetrofosmin (TC-MYOVIEW) injection 29.9 milli Curie  16.1 millicurie Intravenous Once PRN Dorothy Spark, MD        Allergies:   Tree extract and Ciprofloxacin    Social History:  The patient  reports that she has never smoked. She has never used smokeless tobacco. She reports that she drinks alcohol. She reports that she does not use drugs.   Family History:  The patient's family history includes Asthma in her mother; Diabetes in her sister; Heart failure in her mother; Heart murmur in her brother, sister, and sister; Thyroid disease in her sister.    ROS:  Please see the history of present illness.   Otherwise, review of systems are positive for dry mouth.   All other systems are reviewed and negative.   PHYSICAL EXAM: VS:  BP (!) 152/94   Pulse 93   Ht 5' 3.5" (1.613 m)   Wt 105.2 kg (232 lb)   BMI 40.45 kg/m  , BMI Body mass index is 40.45 kg/m. GENERAL:  Well appearing HEENT:  Pupils equal round and reactive, fundi not visualized, oral mucosa unremarkable NECK:  No jugular venous  distention, waveform within normal limits, carotid upstroke brisk and symmetric, no bruits LYMPHATICS:  No cervical adenopathy LUNGS:  Clear to auscultation bilaterally HEART:  RRR.  PMI not displaced or sustained,S1 and S2 within normal limits, no S3, no S4, no clicks, no rubs, II/VI early-peaking systolic murmur at the right upper sternal border. I/IV diastolic murmur at the left upper sternal border.  ABD:  Flat, positive bowel sounds normal in frequency in pitch, no bruits, no rebound, no guarding, no midline pulsatile mass, no hepatomegaly, no splenomegaly EXT:  2 plus pulses throughout, 1+ pitting edema, no cyanosis no clubbing SKIN:  No rashes no nodules NEURO:  Cranial nerves II through XII grossly intact, motor grossly intact throughout PSYCH:  Cognitively intact, oriented to person place and time   EKG:  EKG is not ordered today. The ekg ordered 12/31/15 shows sinus rhythm. Rate 89 bpm. 2 PVCs. 10/28/16: Sinus rhythm. Rate 93 bpm  Coronary angiography 06/01/12:10-20% OM, diffuse 20-30% LAD, 10-20% D1, 20% diffuse RCA RA 10, RV 39/12, RV EDP 12, capillary wedge pressure 19, PA 39/21, mean 27, LV 116/14, LVEDP 20, aorta 115/77 Cardiac output (Fick) 6.55, cardiac index 3.2 PVR 1.7 with units, SVR 12 with units. She P/QRS ratio 1 LVEF 80%. Aortic root mildly dilated. 1-2+ aortic regurgitation.  24-hour Holter 09/26/13: Sinus rhythm, rare PACs, rare PVCs  Echo 01/22/15: LVEF 70%. Mild LVH. Mild diastolic dysfunction. Normal RV function. Left atrium moderately enlarged. Right atrium mildly enlarged. Trace mitral regurgitation. Calcific aortic valve. Mild to moderate aortic regurgitation. Mild aortic stenosis. Peak velocity 2.1 m/s trace tricuspid regurgitation    Lexiscan Myoview 01/2016:   The left ventricular ejection fraction is normal (55-65%). The EF is 60% visually. The computer generated EF is not calculated correctly and is therefore not reported.  There was no ST segment deviation  noted during stress.  The study is normal. no ischemia . no infarction   Echo 01/29/16: Study Conclusions  - Left ventricle: The cavity size was normal. Wall thickness was   increased in a pattern of  moderate LVH. Systolic function was   normal. The estimated ejection fraction was in the range of 60%   to 65%. Basal inferior hypokinesis. Doppler parameters are   consistent with abnormal left ventricular relaxation (grade 1   diastolic dysfunction). - Aortic valve: Trileaflet; moderately calcified leaflets. There   was very mild stenosis. There was mild regurgitation. Mean   gradient (S): 9 mm Hg. Valve area (VTI): 1.68 cm^2. - Mitral valve: There was trivial regurgitation. - Left atrium: The atrium was mildly dilated. - Right ventricle: The cavity size was normal. Systolic function   was normal. - Tricuspid valve: Peak RV-RA gradient (S): 44 mm Hg. - Pulmonary arteries: PA peak pressure: 52 mm Hg (S). - Systemic veins: IVC measured 2.3 cm with > 50% respirophasic   variation, suggesting RA pressure 8 mmHg.  Impressions:  - Normal LV size with moderate LV hypertrophy. EF 60-65%. Basal   inferior hypokinesis. Normal RV size and systolic function. Very   mild aortic stenosis, mild aortic insufficieny. Moderate   pulmonary hypertension.   Recent Labs: 03/31/2016: ALT 15 09/18/2016: B Natriuretic Peptide 103.6 09/19/2016: BUN 18; Creatinine, Ser 1.38; Hemoglobin 12.3; Platelets 213; Potassium 3.7; Sodium 140    Lipid Panel    Component Value Date/Time   CHOL 174 11/15/2015 0952   TRIG 75 11/15/2015 0952   HDL 60 11/15/2015 0952   CHOLHDL 2.9 11/15/2015 0952   VLDL 15 11/15/2015 0952   LDLCALC 99 11/15/2015 0952      Wt Readings from Last 3 Encounters:  10/28/16 105.2 kg (232 lb)  10/20/16 107.1 kg (236 lb 1.6 oz)  10/01/16 107.5 kg (237 lb)      ASSESSMENT AND PLAN:  # Hypertension:  BP remains poorly-controlled.  She has been taking her medication as prescribed.   Stop lasix and switch losartan to losartan/HCTZ 100/25 mg daily.  Continue carvedilol and nifedipine.  Repeat BMP at follow up.  # Aortic regurgitation # Aortic stenosis: Shannon [name] has very mild aortic stenosis (mean gradient 9 mmHg) and mild aortic regurgitation.  Continue to monitor.  Switch lasix to HCTZ as above.  Current medicines are reviewed at length with the patient today.  The patient does not have concerns regarding medicines.  The following changes have been made:  Stop lasix.  Start HCTZ to 25 mg daily.      Labs/ tests ordered today include:   Orders Placed This Encounter  Procedures  . EKG 12-Lead     Disposition:   FU with Chessica Audia C. Oval Linsey, MD, [facility] in 1 month   This note was written with the assistance of speech recognition software.  Please excuse any transcriptional errors.  Signed, Kaidyn Hernandes C. Oval Linsey, MD, [facility]  10/28/2016 11:30 AM    [place]

## 2016-10-28 NOTE — Patient Instructions (Addendum)
Medication Instructions:  STOP FUROSEMIDE   START LOSARTAN HCT 100/25 MG DAILY   ZPAK 2 TABLETS ON DAY ONE AND THEN ONE DAILY UNTIL FINISHED FOR TOTAL OF 5 DAYS HAS BEEN SENT TO YOUR PHARMACY   Labwork: NONE  Testing/Procedures: NONE  Follow-Up: Your physician recommends that you schedule a follow-up appointment in: [place]  If you need a refill on your cardiac medications before your next appointment, please call your pharmacy.

## 2016-10-30 ENCOUNTER — Telehealth: Payer: Self-pay | Admitting: *Deleted

## 2016-10-30 NOTE — Telephone Encounter (Signed)
Received message from [facility]-concerned for coadministration of Zpak and citalopram.  Verfied with Dr. Oval Linsey.  Per Dr. Oval Linsey, she was under the impression patient was not taking her citalopram.    Called patient-patient picked Zpak up and is NOT taking her citalopram.  States she was unable to pick up her Lasix or Losartan and is unsure why.  Called pharmacy to clarify-patient able to pick up losartan today.  Per OV note patient was to STOP lasix.    Attempt to call patient back to make her aware-unable to reach x 2.

## 2016-11-04 ENCOUNTER — Ambulatory Visit (INDEPENDENT_AMBULATORY_CARE_PROVIDER_SITE_OTHER): Payer: Medicare Other | Admitting: Internal Medicine

## 2016-11-04 ENCOUNTER — Encounter: Payer: Self-pay | Admitting: Internal Medicine

## 2016-11-04 VITALS — BP 149/92 | HR 89 | Temp 97.9°F | Ht 63.5 in | Wt 231.0 lb

## 2016-11-04 DIAGNOSIS — B20 Human immunodeficiency virus [HIV] disease: Secondary | ICD-10-CM

## 2016-11-04 DIAGNOSIS — N183 Chronic kidney disease, stage 3 unspecified: Secondary | ICD-10-CM

## 2016-11-04 DIAGNOSIS — Z5181 Encounter for therapeutic drug level monitoring: Secondary | ICD-10-CM

## 2016-11-04 DIAGNOSIS — I10 Essential (primary) hypertension: Secondary | ICD-10-CM

## 2016-11-04 DIAGNOSIS — E538 Deficiency of other specified B group vitamins: Secondary | ICD-10-CM | POA: Diagnosis not present

## 2016-11-04 MED ORDER — "SYRINGE/NEEDLE (DISP) 25G X 5/8"" 1 ML MISC": 1.0000 [IU] | 0 refills | Status: DC

## 2016-11-04 MED ORDER — CYANOCOBALAMIN 1000 MCG/ML IJ SOLN: 1000.0000 ug | INTRAMUSCULAR | 0 refills | Status: DC

## 2016-11-04 NOTE — Progress Notes (Signed)
RFV: hiv disease follow up  Patient ID: Shannon [name], female   DOB: August 22, 1956, 61 y.o.   MRN: [id number]  HPI  Shannon [name] is a 61yo F with well controlled HIV disease, CD 4 count of 640/VL<20 (dec 2017) on tivicay/descovy. She states that she has been exceptionally busy with going back to community college, appears to be very physically active, walking to class. To bus, despite inclement weather. She is noticing some weight loss with increase activity but mainly it sounds like she does not have time to eat large meals due to being in class.  Needs refill on vitamin b12 Outpatient Encounter Prescriptions as of 11/04/2016  Medication Sig  . acetaminophen (TYLENOL) 500 MG tablet Take 500 mg by mouth every 6 (six) hours as needed for mild pain.   [name] albuterol (PROAIR HFA) 108 (90 Base) MCG/ACT inhaler Inhale 2 puffs into the lungs every 4 (four) hours as needed for wheezing or shortness of breath.  [name] albuterol (PROVENTIL) (2.5 MG/3ML) 0.083% nebulizer solution Take 3 mLs (2.5 mg total) by nebulization every 6 (six) hours as needed for wheezing or shortness of breath.  . budesonide (PULMICORT) 0.25 MG/2ML nebulizer solution Take 2 mLs (0.25 mg total) by nebulization 2 (two) times daily.  . carvedilol (COREG) 25 MG tablet Take 1 tablet (25 mg total) by mouth 2 (two) times daily. (Patient taking differently: Take 25 mg by mouth daily. )  . dolutegravir (TIVICAY) 50 MG tablet Take 1 tablet (50 mg total) by mouth daily.  [name] emtricitabine-tenofovir AF (DESCOVY) 200-25 MG tablet Take 1 tablet by mouth daily.  . fluticasone (FLONASE) 50 MCG/ACT nasal spray Place 2 sprays into both nostrils daily.  [name] guaiFENesin (MUCINEX) 600 MG 12 hr tablet Take 1 tablet (600 mg total) by mouth 2 (two) times daily. (Patient taking differently: Take 600 mg by mouth 2 (two) times daily as needed for cough or to loosen phlegm. )  . loratadine (CLARITIN) 10 MG tablet Take 1 tablet (10 mg total) by mouth daily as needed for  allergies.  [name] losartan (COZAAR) 100 MG tablet Take 100 mg by mouth daily.  . Multiple Vitamin (MULTIVITAMIN WITH MINERALS) TABS tablet Take 1 tablet by mouth daily. (Patient taking differently: Take 1 tablet by mouth daily as needed (immune support). )  . NIFEdipine (PROCARDIA XL/ADALAT-CC) 60 MG 24 hr tablet Take 1 tablet (60 mg total) by mouth daily.  [name] PREMARIN vaginal cream Place 1 application vaginally once a week.  . Tetrahydrozoline HCl (VISINE OP) Place 2 drops into both eyes as needed (for dry eyes).  . vitamin C (ASCORBIC ACID) 500 MG tablet Take 2 tablets (1,000 mg total) by mouth daily. (Patient taking differently: Take 500 mg by mouth daily as needed (immune support). )  . citalopram (CELEXA) 10 MG tablet Take 1 tablet (10 mg total) by mouth daily. (Patient not taking: Reported on 11/04/2016)  . cyanocobalamin (,VITAMIN B-12,) 1000 MCG/ML injection Inject 1 mL (1,000 mcg total) into the muscle every 30 (thirty) days. (Patient not taking: Reported on 11/04/2016)  . losartan-hydrochlorothiazide (HYZAAR) 100-25 MG tablet Take 1 tablet by mouth daily. (Patient not taking: Reported on 11/04/2016)  . POTASSIUM PO Take 1 tablet by mouth daily as needed. With lasix  . SYRINGE/NEEDLE, DISP, 1 ML (B-D SYRINGE/NEEDLE 1CC/25GX5/8) 25G X 5/8" 1 ML MISC 1 Units by Does not apply route every 30 (thirty) days. (Patient not taking: Reported on 11/04/2016)  . [DISCONTINUED] azithromycin (ZITHROMAX Z-PAK) 250 MG tablet 2 TABLETS BY MOUTH  ON DAY ONE AND THEN 1 DAILY UNTIL FINISHED   Facility-Administered Encounter Medications as of 11/04/2016  Medication  . technetium tetrofosmin (TC-MYOVIEW) injection 29.9 milli Curie     Patient Active Problem List   Diagnosis Date Noted  . Generalized anxiety disorder 10/22/2016  . Hypersomnia 10/01/2016  . BPPV (benign paroxysmal positional vertigo) 09/18/2016  . Muscle spasm 07/09/2016  . Ganglion cyst of dorsum of right wrist 06/10/2016  . Accessory skin tags  06/10/2016  . Nocturnal hypoxemia 05/13/2016  . Cervical radiculopathy 04/14/2016  . Sinusitis, acute 02/24/2016  . Cough 01/02/2016  . GERD (gastroesophageal reflux disease) 01/02/2016  . Exertional dyspnea 12/04/2015  . OSA (obstructive sleep apnea) 10/10/2015  . Obesity 10/10/2015  . Vitamin B12 deficiency 10/02/2015  . Inappropriate diet and eating habits 08/15/2015  . CAP (community acquired pneumonia) 07/29/2015  . Opacity noted on imaging study   . Dyspnea 07/09/2015  . Asthma, moderate persistent 07/09/2015  . HIV disease ([place]) 07/09/2015  . Essential hypertension 07/09/2015  . Mild diastolic dysfunction 42/68/3419     Health Maintenance Due  Topic Date Due  . TETANUS/TDAP  03/03/1973  . COLONOSCOPY  03/03/2004  . INFLUENZA VACCINE  03/24/2016     I have reviewed her records in [place] link  Review of Systems Review of Systems  Constitutional: Negative for fever, chills, diaphoresis, activity change, appetite change, fatigue and unexpected weight change.  HENT: Negative for congestion, sore throat, rhinorrhea, sneezing, trouble swallowing and sinus pressure.  Eyes: Negative for photophobia and visual disturbance.  Respiratory: Negative for cough, chest tightness, shortness of breath, wheezing and stridor.  Cardiovascular: Negative for chest pain, palpitations and leg swelling.  Gastrointestinal: Negative for nausea, vomiting, abdominal pain, diarrhea, constipation, blood in stool, abdominal distention and anal bleeding.  Genitourinary: Negative for dysuria, hematuria, flank pain and difficulty urinating.  Musculoskeletal: Negative for myalgias, back pain, joint swelling, arthralgias and gait problem.  Skin: Negative for color change, pallor, rash and wound.  Neurological: Negative for dizziness, tremors, weakness and light-headedness.  Hematological: Negative for adenopathy. Does not bruise/bleed easily.  Psychiatric/Behavioral: Negative for behavioral problems,  confusion, sleep disturbance, dysphoric mood, decreased concentration and agitation.    Physical Exam   BP (!) 149/92   Pulse 89   Temp 97.9 F (36.6 C) (Oral)   Ht 5' 3.5" (1.613 m)   Wt 231 lb (104.8 kg)   BMI 40.28 kg/m   Physical Exam  Constitutional:  oriented to person, place, and time. appears well-developed and well-nourished. No distress.  HENT: [place]/AT, PERRLA, no scleral icterus Mouth/Throat: Oropharynx is clear and moist. No oropharyngeal exudate.  Cardiovascular: Normal rate, regular rhythm and normal heart sounds. Exam reveals no gallop and no friction rub.  No murmur heard.  Pulmonary/Chest: Effort normal and breath sounds normal. No respiratory distress.  has no wheezes.  Neck = supple, no nuchal rigidity Abdominal: Soft. Bowel sounds are normal.  exhibits no distension. There is no tenderness.  Lymphadenopathy: no cervical adenopathy. No axillary adenopathy Neurological: alert and oriented to person, place, and time.  Skin: Skin is warm and dry. No rash noted. No erythema.  Psychiatric: a normal mood and affect.  behavior is normal.   Lab Results  Component Value Date   CD4TCELL 32 (L) 08/12/2016   Lab Results  Component Value Date   CD4TABS 640 08/12/2016   CD4TABS 510 03/31/2016   CD4TABS 470 01/07/2016   Lab Results  Component Value Date   HIV1RNAQUANT <20 08/12/2016   No  results found for: HEPBSAB Lab Results  Component Value Date   LABRPR NON REAC 11/15/2015    CBC Lab Results  Component Value Date   WBC 7.3 09/19/2016   RBC 4.12 09/19/2016   HGB 12.3 09/19/2016   HCT 38.1 09/19/2016   PLT 213 09/19/2016   MCV 92.5 09/19/2016   MCH 29.9 09/19/2016   MCHC 32.3 09/19/2016   RDW 13.2 09/19/2016   LYMPHSABS 1.5 09/18/2016   MONOABS 0.3 09/18/2016   EOSABS 0.1 09/18/2016    BMET Lab Results  Component Value Date   NA 140 09/19/2016   K 3.7 09/19/2016   CL 103 09/19/2016   CO2 29 09/19/2016   GLUCOSE 143 (H) 09/19/2016   BUN 18  09/19/2016   CREATININE 1.38 (H) 09/19/2016   CALCIUM 9.8 09/19/2016   GFRNONAA 40 (L) 09/19/2016   GFRAA 46 (L) 09/19/2016      Assessment and Plan  hiv disease = she continues to be well controlled. Will continu on her current regimen  ckd e/drug monitoring= will need to watch if it worsens so that may need to change to triumeq  htn = slightly elevated, if still elevated at next appt. May need to adjust meds.  Hx of vitamin b 12 deficiency = Will give rx for vitb 12 inj  rtc in 3 months, labs 2 wi before

## 2016-11-05 NOTE — Telephone Encounter (Signed)
Spoke with patient and she stated she picked up antibiotic and is feeling better

## 2016-11-10 DIAGNOSIS — G4733 Obstructive sleep apnea (adult) (pediatric): Secondary | ICD-10-CM | POA: Diagnosis not present

## 2016-11-10 DIAGNOSIS — J189 Pneumonia, unspecified organism: Secondary | ICD-10-CM | POA: Diagnosis not present

## 2016-11-18 ENCOUNTER — Ambulatory Visit (INDEPENDENT_AMBULATORY_CARE_PROVIDER_SITE_OTHER): Payer: Medicare Other | Admitting: Internal Medicine

## 2016-11-18 VITALS — BP 199/114 | HR 78 | Temp 98.1°F | Wt 235.8 lb

## 2016-11-18 DIAGNOSIS — I10 Essential (primary) hypertension: Secondary | ICD-10-CM | POA: Diagnosis not present

## 2016-11-18 DIAGNOSIS — J0101 Acute recurrent maxillary sinusitis: Secondary | ICD-10-CM

## 2016-11-18 DIAGNOSIS — J018 Other acute sinusitis: Secondary | ICD-10-CM

## 2016-11-18 DIAGNOSIS — J329 Chronic sinusitis, unspecified: Secondary | ICD-10-CM | POA: Diagnosis not present

## 2016-11-18 DIAGNOSIS — Z9119 Patient's noncompliance with other medical treatment and regimen: Secondary | ICD-10-CM

## 2016-11-18 DIAGNOSIS — R0609 Other forms of dyspnea: Secondary | ICD-10-CM | POA: Diagnosis not present

## 2016-11-18 DIAGNOSIS — Z79899 Other long term (current) drug therapy: Secondary | ICD-10-CM

## 2016-11-18 DIAGNOSIS — Z9981 Dependence on supplemental oxygen: Secondary | ICD-10-CM

## 2016-11-18 DIAGNOSIS — R011 Cardiac murmur, unspecified: Secondary | ICD-10-CM | POA: Diagnosis not present

## 2016-11-18 MED ORDER — AMOXICILLIN-POT CLAVULANATE 875-125 MG PO TABS: 1.0000 | ORAL_TABLET | Freq: Two times a day (BID) | ORAL | 0 refills | Status: AC

## 2016-11-18 NOTE — Patient Instructions (Addendum)
Please take augmentin one pill twice a day for 7 days.   Please get a neti pot and use it every day until you get over this and then at least 3 times a week to help prevent further problems with your sinuses and respiratory tract.   Like we talked about please take your heart and blood pressure medicines every day. They do not have to be all at one time, you can split them up between morning and evening.  These are your medicines for blood pressure and heart: -Losartan-HCTZ one pill a day -Carvedilol one pill twice a day -Nifedipine one pill once a day  Like we talked about for your bloating and constipation, take miralax once a day.  Please use your oxygen when you are walking around even when you are out of the office.  NASAL STEROID USE INSTRUCTIONS  Step 1. Prepare the nose. Blow the nose before administering the drug.  Step 2. Prime and activate the delivery device as recommended by the manufacturer.  Step 3. Position the head by tilting the head forward.  Step 4. Insert the tip of the applicator gently, avoiding contact with the septum.  Step 5. Aim the applicator tip about 45 from the floor of the nose and direct it at the outer corner of the eye on the same side to avoid traumatizing or spraying the septum.  Step 6. Close the other nostril gently with a finger.   Step 7. Sniff or inhale gently while delivering the drug.

## 2016-11-18 NOTE — Progress Notes (Signed)
CC: congestion  HPI:  [name] is a 61 y.o. with a PMH of asthma/COPD, HTN, HLD, HIV, GERD presenting to clinic for evaluation of congestion.  Patient states that over the last two weeks she has had progressive nasal drainage, congestion, sinus pain with associated headache, post nasal drainage, sore throat, chest congestion and cough productive of green sputum. She has not tried anything at home to relieve her symptoms.   Patient was hypertensive to 199/114 today; she states that whenever she gets sick her BP rises. She maintains compliance with her blood pressure medicines, however states she has taken losartan (not losartan-HCTZ) today to avoid frequent urination while out, and has also taken carvedilol this morning. Nifedipine was last taken 3 days ago due to her concern it was going to raise her BP even more. Further, she states taking all of her BP meds at once brings her BP too low to SBP of 110s. She denies chest pain, has shortness of breath with exertion that is stable for her, denies vision or hearing changes, numbness, tingling, focal weakness, facial drooping, dysarthria, dysphagia, orthopnea, or LE edema.  Patient was admitted in January during which time she was found to have a new ambulatory O2 requirement of 3L thought to be due to either progression of her pulmonary disease or her viral infection at the time. Today in office, she desaturated to 88% with ambulation and improved to mid-90's with 2L [place]. Patient states she has not been using her oxygen during the day.   Please see problem based Assessment and Plan for status of patients chronic conditions.  Past Medical History:  Diagnosis Date  . Anemia   . Anxiety    HX PANIC ATTACKS  . Arthritis    "starting to; in my hands" (07/09/2015)  . Chronic asthma with acute exacerbation    "I have chronic asthma all the time; sometimes exacerbations" (07/09/2015)  . Chronic lower back pain   . Cyst of right kidney    "3  of them; dx'd in ~ 01/2015"  . GERD (gastroesophageal reflux disease)   . Heart murmur   . History of blood transfusion    "related to my brain surgery I think"  . History of pulmonary embolism 07/09/2015  . HIV antibody positive ([place])   . HIV disease ([place])   . Hyperlipidemia   . Hypertension   . Lipodystrophy   . Pneumonia 07/09/2015  . Sleep apnea    "never completed part 2 of study; never wore mask" (07/09/2015)    Review of Systems:   Review of Systems  Constitutional: Positive for chills. Negative for fever and malaise/fatigue.  HENT: Positive for congestion, sinus pain and sore throat. Negative for hearing loss and tinnitus.   Eyes: Negative for blurred vision, double vision and pain.  Respiratory: Positive for cough, sputum production and shortness of breath. Negative for hemoptysis and wheezing.   Cardiovascular: Negative for chest pain, orthopnea and leg swelling.  Gastrointestinal: Negative for abdominal pain, constipation, diarrhea, nausea and vomiting.  Musculoskeletal: Negative for myalgias.  Skin: Negative for rash.  Neurological: Positive for headaches (due to sinus pain). Negative for dizziness, sensory change, focal weakness and weakness.    Physical Exam:  Vitals:   11/18/16 1536  BP: (!) 196/113  Pulse: 82  Temp: 98.1 F (36.7 C)  TempSrc: Oral  SpO2: 92%  Weight: 235 lb 12.8 oz (107 kg)   Physical Exam  Constitutional: She is oriented to person, place, and time. No  distress.  HENT:  Head: Normocephalic and atraumatic.  Mouth/Throat: Oropharynx is clear and moist. No oropharyngeal exudate.  Edematous nasal turbinates   Eyes: Conjunctivae and EOM are normal. Pupils are equal, round, and reactive to light. No scleral icterus.  Neck: Normal range of motion. Neck supple.  Cardiovascular: Normal rate, regular rhythm and intact distal pulses.  Exam reveals no gallop and no friction rub.   Murmur (systolic ejection murmur) heard. Pulmonary/Chest: Effort  normal and breath sounds normal. She has no wheezes. She has no rales.  Abdominal: Soft. Bowel sounds are normal. She exhibits no distension. There is no tenderness. There is no guarding.  Musculoskeletal: Normal range of motion. She exhibits no edema.  Lymphadenopathy:    She has no cervical adenopathy.  Neurological: She is alert and oriented to person, place, and time. No cranial nerve deficit or sensory deficit. She exhibits normal muscle tone. Coordination normal.  Skin: Skin is warm and dry. Capillary refill takes less than 2 seconds. No rash noted. She is not diaphoretic. No erythema. No pallor.  Psychiatric: Judgment and thought content normal.    Assessment & Plan:   See Encounters Tab for problem based charting.   Patient discussed with Dr. Nilsa Nutting, MD Internal Medicine PGY1

## 2016-11-20 NOTE — Assessment & Plan Note (Signed)
Patient with stable exertional dyspnea with recently new  ambulatory O2 requirement of 3L thought to be due to either progression of her pulmonary disease or her viral infection at the time. Today in office, she desaturated to 88% with ambulation and improved to mid-90's with 2L [place]. Patient states she has not been using her oxygen during the day.   She appears euvolemic on exam today with minimal LE edema, no rales, no JVD; she denies orthopnea.   Plan: --advised patient to use her oxygen with ambulation as well; she is to contact her equipment company for details on getting portable tanks.

## 2016-11-20 NOTE — Assessment & Plan Note (Addendum)
Patient hypertensive to 199/114 in clinic today without symptoms. She is noncompliant with antihypertensive regimen likely due to poor health literacy. I spoke at length with patient educating her on anti-hypertensive medicines, need for regular dosing, risks of continued uncontrolled HTN.   I have provided her with a written list of her BP meds and how often to take them; I have advised her to space out her once daily meds between morning and evening as she appears to be symptomatically hypotensive when taking all at once.   Plan: --no changes to meds at this time --losartan-HCTZ 100-'25mg'$  daily --nifedipine '60mg'$  daily --carvedilol '25mg'$  BID --f/u in 2 weeks

## 2016-11-20 NOTE — Assessment & Plan Note (Signed)
Patient with chronic sinusitis with worsened symptoms over last two weeks with purulent mucous, associated with cough, chills, sinus pressure and headaches. Exam positive for bilateral maxillary and frontal sinus tenderness; lungs without wheezes or rales.   Plan: --augmentin --advised patient to use daily flonase and antihistamine for control of current symptoms and future prevention. --advised patient on use of neti pot daily currently then 3-4 times a week for chronic control of sinusitis.

## 2016-11-23 NOTE — Progress Notes (Signed)
Internal Medicine Clinic Attending  Case discussed with Dr. Svalina  at the time of the visit.  We reviewed the resident's history and exam and pertinent patient test results.  I agree with the assessment, diagnosis, and plan of care documented in the resident's note.  

## 2016-11-24 ENCOUNTER — Emergency Department (HOSPITAL_COMMUNITY)
Admission: EM | Admit: 2016-11-24 | Discharge: 2016-11-24 | Disposition: A | Discharge status: Home / Self Care | Payer: Medicare Other | Attending: Emergency Medicine | Admitting: Emergency Medicine

## 2016-11-24 ENCOUNTER — Emergency Department (HOSPITAL_COMMUNITY): Payer: Medicare Other

## 2016-11-24 DIAGNOSIS — R0602 Shortness of breath: Secondary | ICD-10-CM | POA: Diagnosis not present

## 2016-11-24 DIAGNOSIS — I10 Essential (primary) hypertension: Secondary | ICD-10-CM | POA: Diagnosis not present

## 2016-11-24 DIAGNOSIS — J45909 Unspecified asthma, uncomplicated: Secondary | ICD-10-CM | POA: Insufficient documentation

## 2016-11-24 DIAGNOSIS — Z79899 Other long term (current) drug therapy: Secondary | ICD-10-CM | POA: Diagnosis not present

## 2016-11-24 DIAGNOSIS — E662 Morbid (severe) obesity with alveolar hypoventilation: Secondary | ICD-10-CM | POA: Insufficient documentation

## 2016-11-24 DIAGNOSIS — B20 Human immunodeficiency virus [HIV] disease: Secondary | ICD-10-CM | POA: Insufficient documentation

## 2016-11-24 DIAGNOSIS — R0609 Other forms of dyspnea: Secondary | ICD-10-CM

## 2016-11-24 DIAGNOSIS — R079 Chest pain, unspecified: Secondary | ICD-10-CM | POA: Diagnosis not present

## 2016-11-24 LAB — BASIC METABOLIC PANEL
Anion gap: 7 (ref 5–15)
BUN: 10 mg/dL (ref 6–20)
CHLORIDE: 102 mmol/L (ref 101–111)
CO2: 32 mmol/L (ref 22–32)
CREATININE: 1.18 mg/dL — AB (ref 0.44–1.00)
Calcium: 9.5 mg/dL (ref 8.9–10.3)
GFR calc Af Amer: 56 mL/min — ABNORMAL LOW (ref >60)
GFR calc non Af Amer: 48 mL/min — ABNORMAL LOW (ref >60)
GLUCOSE: 122 mg/dL — AB (ref 65–99)
Potassium: 3.7 mmol/L (ref 3.5–5.1)
Sodium: 141 mmol/L (ref 135–145)

## 2016-11-24 LAB — CBC
HCT: 38.5 % (ref 36.0–46.0)
Hemoglobin: 12.5 g/dL (ref 12.0–15.0)
MCH: 30.4 pg (ref 26.0–34.0)
MCHC: 32.5 g/dL (ref 30.0–36.0)
MCV: 93.7 fL (ref 78.0–100.0)
PLATELETS: 182 10*3/uL (ref 150–400)
RBC: 4.11 MIL/uL (ref 3.87–5.11)
RDW: 13.1 % (ref 11.5–15.5)
WBC: 4.9 10*3/uL (ref 4.0–10.5)

## 2016-11-24 LAB — I-STAT TROPONIN, ED: Troponin i, poc: 0 ng/mL (ref 0.00–0.08)

## 2016-11-24 MED ORDER — CARVEDILOL 12.5 MG PO TABS: 25.0000 mg | ORAL_TABLET | Freq: Two times a day (BID) | ORAL | Status: DC

## 2016-11-24 MED ORDER — NIFEDIPINE ER 60 MG PO TB24
60.0000 mg | ORAL_TABLET | Freq: Once | ORAL | Status: AC
  Administered 2016-11-24: 60 mg via ORAL
  Filled 2016-11-24: qty 1

## 2016-11-24 MED ORDER — LOSARTAN POTASSIUM 50 MG PO TABS
100.0000 mg | ORAL_TABLET | Freq: Once | ORAL | Status: AC
  Administered 2016-11-24: 100 mg via ORAL
  Filled 2016-11-24: qty 2

## 2016-11-24 MED ORDER — IPRATROPIUM-ALBUTEROL 0.5-2.5 (3) MG/3ML IN SOLN
3.0000 mL | Freq: Once | RESPIRATORY_TRACT | Status: AC
  Administered 2016-11-24: 3 mL via RESPIRATORY_TRACT
  Filled 2016-11-24: qty 3

## 2016-11-24 NOTE — Discharge Instructions (Signed)
Continue all of your regularly prescribed medications on their recommended schedule. See your family doctor as soon as possible to discuss weight loss and nutrition as well as your blood pressure management.

## 2016-11-24 NOTE — ED Notes (Signed)
Patient returned from X-ray 

## 2016-11-24 NOTE — ED Notes (Signed)
Called Caryl Pina, Education officer, museum for [employer].  Caryl Pina will write cab voucher and leave at lobby.  Pt being discharged out of room now.

## 2016-11-24 NOTE — ED Notes (Signed)
Pt needs cab voucher home, unable to find available to come get her.  Charge aware, advised to page social worker.

## 2016-11-24 NOTE — ED Notes (Signed)
Patient transported to X-ray 

## 2016-11-24 NOTE — ED Triage Notes (Addendum)
mid sternal CP all night off and on with some associated SOB but wears home O2, 1 L. EMS administered 4L O2 with 324ASA and 1 nitro with some relief. Sinus rhythm with EMS, no N/V.  BP 168/104 with EMS  Patient is HIV positive

## 2016-11-24 NOTE — ED Provider Notes (Signed)
[facility] DEPT Provider Note   CSN: [id number] Arrival date & time: 11/24/16  1003     History   Chief Complaint Chief Complaint  Patient presents with  . Chest Pain  . Hypertension    HPI Shannon [name] is a 61 y.o. female.  HPI Patient reports she's had problems with chest pain and shortness of breath more or less chronically. She reports that she knows her body and it is not due to her asthma nor her hypertension as she reports her doctor's sometimes indicate. She also reports that her HIV has been controlled well for a long time and that has nothing to do with that either. She thinks it's due to her central obesity. She reports it pushes up into her chest and makes it hard for her to breathe. She reports that she wants to see a weight management specialist. Apparently she has not had the referrals to weight management that she is seeking and wants to know if there are specialists available [facility]. She reports due to being chronically short of breath, it's hard for her to exercise. She reports that she had sleep apnea testing and was told she needed nighttime oxygen but not C Pap. Patient also reports that her blood pressure is chronically elevated. She reports it's almost always at least systolic 570V and sometimes 200s. She reports that she is on a lot of medications for this. She states that the problem is that the medications don't necessarily help and if she takes medications and her blood pressure might get too low. Despite expressing multiple concerns regarding the medications, patient reports that she is being compliant with them. We had a long discussion regarding multiple concerns  the patient had regarding her medications and underlying medical conditions, it appears one of her main objectives is to find help with weight management. Past Medical History:  Diagnosis Date  . Anemia   . Anxiety    HX PANIC ATTACKS  . Arthritis    "starting to; in my hands"  (07/09/2015)  . Chronic asthma with acute exacerbation    "I have chronic asthma all the time; sometimes exacerbations" (07/09/2015)  . Chronic lower back pain   . Cyst of right kidney    "3 of them; dx'd in ~ 01/2015"  . GERD (gastroesophageal reflux disease)   . Heart murmur   . History of blood transfusion    "related to my brain surgery I think"  . History of pulmonary embolism 07/09/2015  . HIV antibody positive ([place])   . HIV disease ([place])   . Hyperlipidemia   . Hypertension   . Lipodystrophy   . Pneumonia 07/09/2015  . Sleep apnea    "never completed part 2 of study; never wore mask" (07/09/2015)    Patient Active Problem List   Diagnosis Date Noted  . Generalized anxiety disorder 10/22/2016  . Hypersomnia 10/01/2016  . BPPV (benign paroxysmal positional vertigo) 09/18/2016  . Muscle spasm 07/09/2016  . Ganglion cyst of dorsum of right wrist 06/10/2016  . Accessory skin tags 06/10/2016  . Nocturnal hypoxemia 05/13/2016  . Cervical radiculopathy 04/14/2016  . Sinusitis, acute 02/24/2016  . Cough 01/02/2016  . GERD (gastroesophageal reflux disease) 01/02/2016  . Exertional dyspnea 12/04/2015  . OSA (obstructive sleep apnea) 10/10/2015  . Obesity 10/10/2015  . Vitamin B12 deficiency 10/02/2015  . Inappropriate diet and eating habits 08/15/2015  . Opacity noted on imaging study   . Dyspnea 07/09/2015  . Asthma, moderate persistent 07/09/2015  .  HIV disease ([place]) 07/09/2015  . Essential hypertension 07/09/2015  . Mild diastolic dysfunction 89/38/1017    Past Surgical History:  Procedure Laterality Date  . ABDOMINAL HYSTERECTOMY     "robotic laparosopic"  . BRAIN SURGERY  1974   "brain tumor; benign; on top of my brain; got a plate in there"  . CARDIAC CATHETERIZATION    . TONSILLECTOMY AND ADENOIDECTOMY      OB History    No data available       Home Medications    Prior to Admission medications   Medication Sig Start Date End Date Taking? Authorizing  Provider  acetaminophen (TYLENOL) 500 MG tablet Take 500 mg by mouth every 6 (six) hours as needed for mild pain.    Yes Historical Provider, MD  albuterol (PROAIR HFA) 108 (90 Base) MCG/ACT inhaler Inhale 2 puffs into the lungs every 4 (four) hours as needed for wheezing or shortness of breath. 09/19/16  Yes Minus Liberty, MD  albuterol (PROVENTIL) (2.5 MG/3ML) 0.083% nebulizer solution Take 3 mLs (2.5 mg total) by nebulization every 6 (six) hours as needed for wheezing or shortness of breath. 09/19/16  Yes Minus Liberty, MD  amoxicillin-clavulanate (AUGMENTIN) 875-125 MG tablet Take 1 tablet by mouth 2 (two) times daily. 11/18/16 11/25/16 Yes Alphonzo Grieve, MD  budesonide (PULMICORT) 0.25 MG/2ML nebulizer solution Take 2 mLs (0.25 mg total) by nebulization 2 (two) times daily. 09/19/16  Yes Minus Liberty, MD  carvedilol (COREG) 25 MG tablet Take 1 tablet (25 mg total) by mouth 2 (two) times daily. Patient taking differently: Take 25 mg by mouth daily.  07/23/16 10/28/20 Yes Skeet Latch, MD  dolutegravir (TIVICAY) 50 MG tablet Take 1 tablet (50 mg total) by mouth daily. 11/28/15  Yes [name], MD  emtricitabine-tenofovir AF (DESCOVY) 200-25 MG tablet Take 1 tablet by mouth daily. 11/28/15  Yes [name], MD  guaiFENesin (MUCINEX) 600 MG 12 hr tablet Take 1 tablet (600 mg total) by mouth 2 (two) times daily. Patient taking differently: Take 600 mg by mouth 2 (two) times daily as needed for cough or to loosen phlegm.  06/10/16  Yes Alphonzo Grieve, MD  loratadine (CLARITIN) 10 MG tablet Take 1 tablet (10 mg total) by mouth daily as needed for allergies. 06/10/16 06/10/17 Yes Alphonzo Grieve, MD  losartan-hydrochlorothiazide (HYZAAR) 100-25 MG tablet Take 1 tablet by mouth daily. 10/28/16  Yes Skeet Latch, MD  Multiple Vitamin (MULTIVITAMIN WITH MINERALS) TABS tablet Take 1 tablet by mouth daily. Patient taking differently: Take 1 tablet by mouth daily as needed (immune support).   08/29/15  Yes Collier Salina, MD  NIFEdipine (PROCARDIA XL/ADALAT-CC) 60 MG 24 hr tablet Take 1 tablet (60 mg total) by mouth daily. 04/17/16 04/17/17 Yes Skeet Latch, MD  POTASSIUM PO Take 1 tablet by mouth daily as needed. With lasix   Yes Historical Provider, MD  PREMARIN vaginal cream Place 1 application vaginally once a week. 04/03/16  Yes Historical Provider, MD  SYRINGE/NEEDLE, DISP, 1 ML (B-D SYRINGE/NEEDLE 1CC/25GX5/8) 25G X 5/8" 1 ML MISC 1 Units by Does not apply route every 30 (thirty) days. 11/04/16  Yes [name], MD  Tetrahydrozoline HCl (VISINE OP) Place 2 drops into both eyes as needed (for dry eyes).   Yes Historical Provider, MD  vitamin C (ASCORBIC ACID) 500 MG tablet Take 2 tablets (1,000 mg total) by mouth daily. Patient taking differently: Take 500 mg by mouth daily as needed (immune support).  08/29/15  Yes Collier Salina, MD  citalopram (  CELEXA) 10 MG tablet Take 1 tablet (10 mg total) by mouth daily. Patient not taking: Reported on 11/24/2016 10/20/16 10/20/17  Ledell Noss, MD  cyanocobalamin (,VITAMIN B-12,) 1000 MCG/ML injection Inject 1 mL (1,000 mcg total) into the muscle every 30 (thirty) days. Patient not taking: Reported on 11/24/2016 11/04/16   [name], MD  fluticasone [facility]) 50 MCG/ACT nasal spray Place 2 sprays into both nostrils daily. Patient not taking: Reported on 11/24/2016 10/01/16   Lucious Groves, DO    Family History Family History  Problem Relation Age of Onset  . Asthma Mother   . Heart failure Mother     cardiomyopathy  . Heart murmur Sister   . Heart murmur Brother   . Diabetes Sister   . Thyroid disease Sister   . Heart murmur Sister     Social History Social History  Substance Use Topics  . Smoking status: Never Smoker  . Smokeless tobacco: Never Used  . Alcohol use 0.0 oz/week     Comment: Rarely.     Allergies   Tree extract and Ciprofloxacin   Review of Systems Review of Systems 10 Systems reviewed and are  negative for acute change except as noted in the HPI.   Physical Exam Updated Vital Signs BP (!) 162/94 (BP Location: Right Arm)   Pulse 84   Resp 18   SpO2 90%   Physical Exam  Constitutional: She is oriented to person, place, and time.  Patient is alert and nontoxic. No respiratory distress. Significant central obesity.  HENT:  Head: Normocephalic and atraumatic.  Mouth/Throat: Oropharynx is clear and moist.  Eyes: EOM are normal. Pupils are equal, round, and reactive to light.  Neck: Neck supple.  Cardiovascular: Normal rate, regular rhythm, normal heart sounds and intact distal pulses.   Pulmonary/Chest:  No respiratory distress at rest. Soft breath sounds throughout. No gross wheeze rhonchi or rail.  Abdominal: Soft. Bowel sounds are normal. She exhibits no distension. There is no tenderness.  Central obesity, no abdominal pain.  Musculoskeletal: Normal range of motion. She exhibits no edema or tenderness.  Condition of the patient's lower extremities is excellent. Patient has well defined calf and anterior tibial musculature. She does not have any peripheral edema. Calves are nontender.  Neurological: She is alert and oriented to person, place, and time. No cranial nerve deficit. She exhibits normal muscle tone. Coordination normal.  Skin: Skin is warm and dry.  Psychiatric: She has a normal mood and affect.     ED Treatments / Results  Labs (all labs ordered are listed, but only abnormal results are displayed) Labs Reviewed  BASIC METABOLIC PANEL - Abnormal; Notable for the following:       Result Value   Glucose, Bld 122 (*)    Creatinine, Ser 1.18 (*)    GFR calc non Af Amer 48 (*)    GFR calc Af Amer 56 (*)    All other components within normal limits  CBC  I-STAT TROPOININ, ED    EKG  EKG Interpretation  Date/Time:  Tuesday November 24 2016 10:16:42 EDT Ventricular Rate:  66 PR Interval:    QRS Duration: 89 QT Interval:  408 QTC Calculation: 428 R  Axis:   3 Text Interpretation:  Sinus rhythm Confirmed by [name], [place] [phone]) on 11/24/2016 10:33:58 AM       Radiology Dg Chest 2 View  Result Date: 11/24/2016 CLINICAL DATA:  Chest pain and shortness of breath beginning yesterday. Asthma. EXAM: CHEST  2 VIEW COMPARISON:  09/18/2016 FINDINGS: Heart size is stable and within normal limits allowing for low lung volumes. Stable ectasia of thoracic aorta. Mild scarring again seen in the lingula. No evidence of pulmonary infiltrate or edema. No evidence of pneumothorax or pleural effusion. IMPRESSION: Stable exam.  No active cardiopulmonary disease. Electronically Signed   By: Earle Gell M.D.   On: 11/24/2016 10:51    Procedures Procedures (including critical care time)  Medications Ordered in ED Medications  NIFEdipine (PROCARDIA-XL/ADALAT CC) 24 hr tablet 60 mg (not administered)  carvedilol (COREG) tablet 25 mg (not administered)  losartan (COZAAR) tablet 100 mg (not administered)  ipratropium-albuterol (DUONEB) 0.5-2.5 (3) MG/3ML nebulizer solution 3 mL (3 mLs Nebulization Given 11/24/16 1245)     Initial Impression / Assessment and Plan / ED Course  I have reviewed the triage vital signs and the nursing notes.  Pertinent labs & imaging results that were available during my care of the patient were reviewed by me and considered in my medical decision making (see chart for details).     Final Clinical Impressions(s) / ED Diagnoses   Final diagnoses:  Other form of dyspnea  Essential hypertension  Obesity with alveolar hypoventilation and serious comorbidity, unspecified classification ([place])  HIV (human immunodeficiency virus infection) ([place])   Patient poor she feels much improved after taking her medications and getting a breathing treatment. Diagnostic workup is negative. Patient has been counseled on the follow-up plan. She is counseled on being compliant with her medications and keeping a journal log such that she might  review her response to medications with her doctor. He is also planned to discuss weight loss and referrals for weight loss management. New Prescriptions New Prescriptions   No medications on file     Charlesetta Shanks, MD 11/24/16 1359

## 2016-11-25 ENCOUNTER — Telehealth: Payer: Self-pay

## 2016-11-26 ENCOUNTER — Telehealth: Payer: Self-pay | Admitting: *Deleted

## 2016-11-26 ENCOUNTER — Other Ambulatory Visit: Payer: Medicare Other

## 2016-11-26 DIAGNOSIS — Z79899 Other long term (current) drug therapy: Secondary | ICD-10-CM | POA: Diagnosis not present

## 2016-11-26 DIAGNOSIS — B20 Human immunodeficiency virus [HIV] disease: Secondary | ICD-10-CM

## 2016-11-26 DIAGNOSIS — Z113 Encounter for screening for infections with a predominantly sexual mode of transmission: Secondary | ICD-10-CM

## 2016-11-26 LAB — LIPID PANEL
CHOL/HDL RATIO: 3.2 ratio (ref <5.0)
CHOLESTEROL: 217 mg/dL — AB (ref <200)
HDL: 67 mg/dL (ref >50)
LDL Cholesterol: 132 mg/dL — ABNORMAL HIGH (ref <100)
TRIGLYCERIDES: 88 mg/dL (ref <150)
VLDL: 18 mg/dL (ref <30)

## 2016-11-27 LAB — RPR

## 2016-11-27 LAB — T-HELPER CELL (CD4) - (RCID CLINIC ONLY)
CD4 T CELL HELPER: 34 % (ref 33–55)
CD4 T Cell Abs: 730 /uL (ref 400–2700)

## 2016-11-30 ENCOUNTER — Encounter (HOSPITAL_COMMUNITY): Payer: Self-pay

## 2016-11-30 ENCOUNTER — Emergency Department (HOSPITAL_COMMUNITY): Payer: Medicare Other

## 2016-11-30 ENCOUNTER — Emergency Department (HOSPITAL_COMMUNITY)
Admission: EM | Admit: 2016-11-30 | Discharge: 2016-11-30 | Disposition: A | Discharge status: Home / Self Care | Payer: Medicare Other | Attending: Emergency Medicine | Admitting: Emergency Medicine

## 2016-11-30 DIAGNOSIS — M542 Cervicalgia: Secondary | ICD-10-CM | POA: Diagnosis not present

## 2016-11-30 DIAGNOSIS — R300 Dysuria: Secondary | ICD-10-CM | POA: Diagnosis not present

## 2016-11-30 DIAGNOSIS — B3731 Acute candidiasis of vulva and vagina: Secondary | ICD-10-CM

## 2016-11-30 DIAGNOSIS — I129 Hypertensive chronic kidney disease with stage 1 through stage 4 chronic kidney disease, or unspecified chronic kidney disease: Secondary | ICD-10-CM | POA: Insufficient documentation

## 2016-11-30 DIAGNOSIS — B373 Candidiasis of vulva and vagina: Secondary | ICD-10-CM | POA: Diagnosis not present

## 2016-11-30 DIAGNOSIS — R109 Unspecified abdominal pain: Secondary | ICD-10-CM | POA: Diagnosis not present

## 2016-11-30 DIAGNOSIS — M545 Low back pain, unspecified: Secondary | ICD-10-CM

## 2016-11-30 DIAGNOSIS — N183 Chronic kidney disease, stage 3 (moderate): Secondary | ICD-10-CM | POA: Diagnosis not present

## 2016-11-30 DIAGNOSIS — R1084 Generalized abdominal pain: Secondary | ICD-10-CM | POA: Diagnosis not present

## 2016-11-30 DIAGNOSIS — R069 Unspecified abnormalities of breathing: Secondary | ICD-10-CM | POA: Diagnosis not present

## 2016-11-30 DIAGNOSIS — M5489 Other dorsalgia: Secondary | ICD-10-CM | POA: Diagnosis not present

## 2016-11-30 DIAGNOSIS — N281 Cyst of kidney, acquired: Secondary | ICD-10-CM | POA: Diagnosis not present

## 2016-11-30 DIAGNOSIS — J989 Respiratory disorder, unspecified: Secondary | ICD-10-CM | POA: Diagnosis not present

## 2016-11-30 DIAGNOSIS — J45909 Unspecified asthma, uncomplicated: Secondary | ICD-10-CM | POA: Diagnosis not present

## 2016-11-30 LAB — BASIC METABOLIC PANEL
ANION GAP: 8 (ref 5–15)
BUN: 21 mg/dL — ABNORMAL HIGH (ref 6–20)
CALCIUM: 9.8 mg/dL (ref 8.9–10.3)
CO2: 32 mmol/L (ref 22–32)
Chloride: 100 mmol/L — ABNORMAL LOW (ref 101–111)
Creatinine, Ser: 1.18 mg/dL — ABNORMAL HIGH (ref 0.44–1.00)
GFR, EST AFRICAN AMERICAN: 56 mL/min — AB (ref >60)
GFR, EST NON AFRICAN AMERICAN: 48 mL/min — AB (ref >60)
GLUCOSE: 127 mg/dL — AB (ref 65–99)
Potassium: 3.4 mmol/L — ABNORMAL LOW (ref 3.5–5.1)
Sodium: 140 mmol/L (ref 135–145)

## 2016-11-30 LAB — URINALYSIS, ROUTINE W REFLEX MICROSCOPIC
Bilirubin Urine: NEGATIVE
GLUCOSE, UA: NEGATIVE mg/dL
Hgb urine dipstick: NEGATIVE
KETONES UR: NEGATIVE mg/dL
Nitrite: NEGATIVE
PROTEIN: NEGATIVE mg/dL
Specific Gravity, Urine: 1.015 (ref 1.005–1.030)
pH: 5 (ref 5.0–8.0)

## 2016-11-30 LAB — CBC
HCT: 40 % (ref 36.0–46.0)
HEMOGLOBIN: 13 g/dL (ref 12.0–15.0)
MCH: 30.8 pg (ref 26.0–34.0)
MCHC: 32.5 g/dL (ref 30.0–36.0)
MCV: 94.8 fL (ref 78.0–100.0)
PLATELETS: 169 10*3/uL (ref 150–400)
RBC: 4.22 MIL/uL (ref 3.87–5.11)
RDW: 13 % (ref 11.5–15.5)
WBC: 5.3 10*3/uL (ref 4.0–10.5)

## 2016-11-30 LAB — HIV-1 RNA QUANT-NO REFLEX-BLD
HIV 1 RNA Quant: <20 copies/mL — AB
HIV-1 RNA Quant, Log: <1.3 Log copies/mL — AB

## 2016-11-30 LAB — WET PREP, GENITAL
Clue Cells Wet Prep HPF POC: NONE SEEN
SPERM: NONE SEEN
TRICH WET PREP: NONE SEEN
WBC, Wet Prep HPF POC: NONE SEEN

## 2016-11-30 MED ORDER — FLUCONAZOLE 150 MG PO TABS: 150.0000 mg | ORAL_TABLET | Freq: Once | ORAL | 0 refills | Status: AC

## 2016-11-30 NOTE — ED Notes (Signed)
Patient stated that she has already been stuck for labs about 45  minutes ago.

## 2016-11-30 NOTE — Discharge Instructions (Signed)
For the pain in her back, use Tylenol every 4 hours, and heat on the sore area.  We are giving you a prescription for Diflucan, which should help treat your yeast vaginitis.

## 2016-11-30 NOTE — Telephone Encounter (Signed)
Thank you :)

## 2016-11-30 NOTE — ED Notes (Signed)
[facility] called for pt transport home.

## 2016-11-30 NOTE — ED Provider Notes (Signed)
[facility] DEPT Provider Note   CSN: [id number] Arrival date & time: 11/30/16  0530     History   Chief Complaint Chief Complaint  Patient presents with  . Flank Pain    HPI Shannon [name] is a 61 y.o. female.  Patient presents for multiple complaints, but wants to concentrate on right lower back pain associated with urinary urgency for several days.  She denies fever, chills, nausea or vomiting.  She indicates that she has chronic weakness, occasional chest pain, ongoing shortness of breath, and concerned about obesity.  She was seen recently, in the emergency department, and evaluated for these latter problems, by another ED provider.  She is taking her usual medications, without relief.  She presents by EMS for evaluation.  There are no other known modifying factors.   HPI  Past Medical History:  Diagnosis Date  . Anemia   . Anxiety    HX PANIC ATTACKS  . Arthritis    "starting to; in my hands" (07/09/2015)  . Chronic asthma with acute exacerbation    "I have chronic asthma all the time; sometimes exacerbations" (07/09/2015)  . Chronic lower back pain   . Cyst of right kidney    "3 of them; dx'd in ~ 01/2015"  . GERD (gastroesophageal reflux disease)   . Heart murmur   . History of blood transfusion    "related to my brain surgery I think"  . History of pulmonary embolism 07/09/2015  . HIV antibody positive ([place])   . HIV disease ([place])   . Hyperlipidemia   . Hypertension   . Lipodystrophy   . Pneumonia 07/09/2015  . Sleep apnea    "never completed part 2 of study; never wore mask" (07/09/2015)    Patient Active Problem List   Diagnosis Date Noted  . Chronic kidney disease (CKD), stage III (moderate) 12/01/2016  . Generalized anxiety disorder 10/22/2016  . Hypersomnia 10/01/2016  . BPPV (benign paroxysmal positional vertigo) 09/18/2016  . Muscle spasm 07/09/2016  . Ganglion cyst of dorsum of right wrist 06/10/2016  . Accessory skin tags 06/10/2016  .  Nocturnal hypoxemia 05/13/2016  . Cervical radiculopathy 04/14/2016  . Sinusitis, acute 02/24/2016  . Cough 01/02/2016  . GERD (gastroesophageal reflux disease) 01/02/2016  . Exertional dyspnea 12/04/2015  . OSA (obstructive sleep apnea) 10/10/2015  . Obesity 10/10/2015  . Vitamin B12 deficiency 10/02/2015  . Inappropriate diet and eating habits 08/15/2015  . Opacity noted on imaging study   . Dyspnea 07/09/2015  . Asthma, moderate persistent 07/09/2015  . HIV disease ([place]) 07/09/2015  . Essential hypertension 07/09/2015  . Mild diastolic dysfunction 32/67/1245    Past Surgical History:  Procedure Laterality Date  . ABDOMINAL HYSTERECTOMY     "robotic laparosopic"  . BRAIN SURGERY  1974   "brain tumor; benign; on top of my brain; got a plate in there"  . CARDIAC CATHETERIZATION    . TONSILLECTOMY AND ADENOIDECTOMY      OB History    No data available       Home Medications    Prior to Admission medications   Medication Sig Start Date End Date Taking? Authorizing Provider  acetaminophen (TYLENOL) 500 MG tablet Take 500 mg by mouth every 6 (six) hours as needed for mild pain.    Yes Historical Provider, MD  albuterol (PROVENTIL) (2.5 MG/3ML) 0.083% nebulizer solution Take 3 mLs (2.5 mg total) by nebulization every 6 (six) hours as needed for wheezing or shortness of breath. 09/19/16  Yes Rodman Key  Inda Castle, MD  budesonide (PULMICORT) 0.25 MG/2ML nebulizer solution Take 2 mLs (0.25 mg total) by nebulization 2 (two) times daily. 09/19/16  Yes Minus Liberty, MD  dolutegravir (TIVICAY) 50 MG tablet Take 1 tablet (50 mg total) by mouth daily. 11/28/15  Yes [name], MD  emtricitabine-tenofovir AF (DESCOVY) 200-25 MG tablet Take 1 tablet by mouth daily. 11/28/15  Yes [name], MD  loratadine (CLARITIN) 10 MG tablet Take 1 tablet (10 mg total) by mouth daily as needed for allergies. 06/10/16 06/10/17 Yes Alphonzo Grieve, MD  losartan-hydrochlorothiazide (HYZAAR) 100-25 MG  tablet Take 1 tablet by mouth daily. 10/28/16  Yes Skeet Latch, MD  Multiple Vitamin (MULTIVITAMIN WITH MINERALS) TABS tablet Take 1 tablet by mouth daily. Patient taking differently: Take 1 tablet by mouth daily as needed (immune support).  08/29/15  Yes Collier Salina, MD  NIFEdipine (PROCARDIA XL/ADALAT-CC) 60 MG 24 hr tablet Take 1 tablet (60 mg total) by mouth daily. 04/17/16 04/17/17 Yes Skeet Latch, MD  POTASSIUM PO Take 1 tablet by mouth daily as needed. With lasix   Yes Historical Provider, MD  PREMARIN vaginal cream Place 1 application vaginally once a week. 04/03/16  Yes Historical Provider, MD  SYRINGE/NEEDLE, DISP, 1 ML (B-D SYRINGE/NEEDLE 1CC/25GX5/8) 25G X 5/8" 1 ML MISC 1 Units by Does not apply route every 30 (thirty) days. 11/04/16  Yes [name], MD  Tetrahydrozoline HCl (VISINE OP) Place 2 drops into both eyes as needed (for dry eyes).   Yes Historical Provider, MD  albuterol (PROAIR HFA) 108 (90 Base) MCG/ACT inhaler Inhale 2 puffs into the lungs every 4 (four) hours as needed for wheezing or shortness of breath. 12/01/16   Maryellen Pile, MD  Ascorbic Acid (VITAMIN C) 1000 MG tablet Take 1,000 mg by mouth daily.    Historical Provider, MD  carvedilol (COREG) 12.5 MG tablet Take 12.5 mg by mouth 2 (two) times daily with a meal.    Historical Provider, MD  citalopram (CELEXA) 10 MG tablet Take 1 tablet (10 mg total) by mouth daily. 10/20/16 10/20/17  Ledell Noss, MD  cyanocobalamin (,VITAMIN B-12,) 1000 MCG/ML injection Inject 1 mL (1,000 mcg total) into the muscle every 30 (thirty) days. 11/04/16   [name], MD  fluticasone (FLONASE) 50 MCG/ACT nasal spray Place 2 sprays into both nostrils daily. 10/01/16   Lucious Groves, DO  hydrALAZINE (APRESOLINE) 25 MG tablet Take 1 tablet (25 mg total) by mouth every 4 (four) hours as needed (FOR SYSTOLIC BLOOD PRESSURE ABOVE 160). 12/01/16   Skeet Latch, MD    Family History Family History  Problem Relation Age of Onset  .  Asthma Mother   . Heart failure Mother     cardiomyopathy  . Heart murmur Sister   . Heart murmur Brother   . Diabetes Sister   . Thyroid disease Sister   . Heart murmur Sister     Social History Social History  Substance Use Topics  . Smoking status: Never Smoker  . Smokeless tobacco: Never Used  . Alcohol use 0.0 oz/week     Comment: Rarely.     Allergies   Tree extract and Ciprofloxacin   Review of Systems Review of Systems  All other systems reviewed and are negative.    Physical Exam Updated Vital Signs BP (!) 160/93 (BP Location: Left Arm)   Pulse 78   Temp 97.8 F (36.6 C) (Oral)   Resp 12   Ht '5\' 3"'$  (1.6 m)   Wt 224 lb (101.6  kg)   SpO2 93%   BMI 39.68 kg/m   Physical Exam  Constitutional: She is oriented to person, place, and time. She appears well-developed. She appears distressed (Somewhat anxious).  Overweight  HENT:  Head: Normocephalic and atraumatic.  Eyes: Conjunctivae and EOM are normal. Pupils are equal, round, and reactive to light.  Neck: Normal range of motion and phonation normal. Neck supple.  Cardiovascular: Normal rate and regular rhythm.   Pulmonary/Chest: Effort normal and breath sounds normal. She exhibits no tenderness.  Abdominal: Soft. She exhibits no distension. There is no tenderness. There is no guarding.  Genitourinary:  Genitourinary Comments: Mild costovertebral angle tenderness, with percussion.  Normal external female genitalia.  Small amount of opaque vaginal discharge.  Patient did not tolerate full speculum examination secondary to pain, stating "I am small in there."  On bimanual examination there was no palpable mass.  Musculoskeletal: Normal range of motion.  Neurological: She is alert and oriented to person, place, and time. She exhibits normal muscle tone.  Skin: Skin is warm and dry.  Psychiatric: Her behavior is normal. Judgment and thought content normal.  Anxious  Nursing note and vitals reviewed.    ED  Treatments / Results  Labs (all labs ordered are listed, but only abnormal results are displayed) Labs Reviewed  WET PREP, GENITAL - Abnormal; Notable for the following:       Result Value   Yeast Wet Prep HPF POC PRESENT (*)    All other components within normal limits  URINALYSIS, ROUTINE W REFLEX MICROSCOPIC - Abnormal; Notable for the following:    APPearance HAZY (*)    Leukocytes, UA SMALL (*)    Bacteria, UA RARE (*)    Squamous Epithelial / LPF 0-5 (*)    All other components within normal limits  BASIC METABOLIC PANEL - Abnormal; Notable for the following:    Potassium 3.4 (*)    Chloride 100 (*)    Glucose, Bld 127 (*)    BUN 21 (*)    Creatinine, Ser 1.18 (*)    GFR calc non Af Amer 48 (*)    GFR calc Af Amer 56 (*)    All other components within normal limits  CBC  RPR  HIV ANTIBODY (ROUTINE TESTING)  GC/CHLAMYDIA PROBE AMP ([place]) NOT AT [facility]    EKG  EKG Interpretation None       Radiology Ct Renal Stone Study  Result Date: 11/30/2016 CLINICAL DATA:  Right flank pain with fever EXAM: CT ABDOMEN AND PELVIS WITHOUT CONTRAST TECHNIQUE: Multidetector CT imaging of the abdomen and pelvis was performed following the standard protocol without oral or intravenous contrast material administration. COMPARISON:  None. FINDINGS: Lower chest:  There is patchy bibasilar lung atelectatic change. Hepatobiliary: No focal liver lesions are evident on this noncontrast enhanced study. Gallbladder wall is not appreciably thickened. There is no biliary duct dilatation. Pancreas: No pancreatic mass or inflammatory focus is evident. Spleen: No splenic lesions are appreciable. Adrenals/Urinary Tract: Adrenals appear normal bilaterally. There are several masses in the left kidney. The largest mass on the left measures 2.1 x 1.5 cm. Has attenuation values greater than is expected with a simple cyst. A similar appearing mass arises from the posterior aspect of the mid left kidney  measuring 1.8 x 1.7 cm. There is a simple cyst arising from the lateral lower pole left kidney. There is a 7 mm mass arising from the posterior upper pole of the left kidney which has attenuation values higher  than is expected with a simple cyst. There is a 1.0 x 0.7 cm mass arising from the anterior upper pole right kidney with attenuation values higher than is expected with a simple cyst. There is a simple cyst arising from the posterior upper pole right kidney measuring 1.2 x 1.2 cm. There is no hydronephrosis on either side. There is calcification in the peripheral right renal artery. There are no calculi in either kidney. There are no ureteral calculi evident on either side. Urinary bladder is midline with wall thickness within normal limits. Stomach/Bowel: There is no appreciable bowel wall or mesenteric thickening. There is no evident bowel obstruction. No free air or portal venous air. Vascular/Lymphatic: There is atherosclerotic calcification in the aorta and common iliac arteries without demonstrable adenopathy. The aorta is mildly ectatic. Major mesenteric vessels appear patent on this noncontrast enhanced study. There is no adenopathy in the abdomen or pelvis. Reproductive: Uterus is absent. No pelvic mass or pelvic fluid collection. Other: Appendix appears normal. No abscess or ascites evident in the abdomen or pelvis. There is a small ventral hernia containing only fat. Musculoskeletal: There is degenerative change in the lower thoracic and lumbar spine regions. There is slight anterolisthesis of L4 on L5 with diffuse disc protrusion in this area causing borderline stenosis. There are no lytic or destructive bone lesions. No intramuscular or abdominal wall lesions. IMPRESSION: Several masses are noted in each kidney which have attenuation values higher than is expected with a simple cyst. Question hyperdense cysts. Correlation with ultrasound could be helpful for further assessment. These areas do not  appear to represent simple cyst by ultrasound, further evaluation non emergently with pre and post-contrast MRI or and postcontrast CT if contraindication to MRI. No renal or ureteral calculus.  No hydronephrosis. No bowel obstruction.  No abscess.  Appendix appears normal. Uterus absent. Small ventral hernia containing only fat. Mild spondylolisthesis at L4-5 with diffuse disc bulging and borderline spinal stenosis at this level. Electronically Signed   By: Lowella Grip III M.D.   On: 11/30/2016 08:14    Procedures Procedures (including critical care time)  Medications Ordered in ED Medications - No data to display   Initial Impression / Assessment and Plan / ED Course  I have reviewed the triage vital signs and the nursing notes.  Pertinent labs & imaging results that were available during my care of the patient were reviewed by me and considered in my medical decision making (see chart for details).  Clinical Course as of Dec 01 1636  Mon Nov 30, 2016  1045 She continues to complain of dysuria.  She is also concerned about her ongoing low back pain present for several days.  Additionally she is concerned about her shortness of breath and sleep apnea which is ongoing.  She does state that she has an appointment with her PCP, "in the morning".  Will proceed with pelvic examination, to see if there is a source there for her urinary discomfort.  [EW]    Clinical Course User Index [EW] Daleen Bo, MD    Medications - No data to display  No data found.   At discharge- reevaluation with update and discussion. After initial assessment and treatment, an updated evaluation reveals she is comfortable has no further complaints.  Findings discussed with patient and all questions answered. Wynton Hufstetler L    Final Clinical Impressions(s) / ED Diagnoses   Final diagnoses:  Right-sided low back pain without sciatica, unspecified chronicity  Dysuria  Yeast vaginitis  Nonspecific low  back pain and dysuria.  Pelvic examination consistent with yeast vaginitis.  Doubt PID, serious bacterial infection, or metabolic instability.  Nursing Notes Reviewed/ Care Coordinated Applicable Imaging Reviewed Interpretation of Laboratory Data incorporated into ED treatment  The patient appears reasonably screened and/or stabilized for discharge and I doubt any other medical condition or other [facility] requiring further screening, evaluation, or treatment in the ED at this time prior to discharge.  Plan: Home Medications-continue usual; Home Treatments-rest, fluids; return here if the recommended treatment, does not improve the symptoms; Recommended follow up-PCP follow-up, on 12/01/16, as scheduled   New Prescriptions Discharge Medication List as of 11/30/2016 12:17 PM    START taking these medications   Details  fluconazole (DIFLUCAN) 150 MG tablet Take 1 tablet (150 mg total) by mouth once., Starting Mon 11/30/2016, Print         Daleen Bo, MD 12/01/16 1642

## 2016-11-30 NOTE — ED Notes (Signed)
Pt refuses to ambulate to bathroom. Says she is short of breath and uses a bedside commode at home. Put on bedpan.

## 2016-11-30 NOTE — ED Notes (Signed)
Bed: CH36 Expected date:  Expected time:  Means of arrival:  Comments: 61 yo F/ Dysuria

## 2016-11-30 NOTE — Telephone Encounter (Signed)
FYI: I was informed of her ED visit last week and weight loss concerns, she has an appointment to be seen in the [facility] tomorrow.

## 2016-11-30 NOTE — ED Triage Notes (Signed)
Pt brought in by EMS from home, pt c/o  Sharp right  flank pain radiating to the right groin area. Pt reports the pain has persisted  intermittent for 2 months and has worsened and awoke pt from her sleep. Per EMS pt also reports  that she has increased weakness, poor urine output, and fevers.

## 2016-11-30 NOTE — Telephone Encounter (Signed)
Patient concerned regarding weight loss but specifically how she is loosing weight. She states she feels she is being brushed off by [facility] and her PCPsince her HIV is well maintained. Patient states "I know my body, I know something is wrong" and is asking for referrals to a bariatric clinic, pulmonologist, and maybe endocrinologist. Will 'cc her primary care physician as well.  Landis Gandy, RN

## 2016-11-30 NOTE — ED Notes (Signed)
Wentz, MD at bedside.  

## 2016-12-01 ENCOUNTER — Ambulatory Visit (INDEPENDENT_AMBULATORY_CARE_PROVIDER_SITE_OTHER): Payer: Medicare Other | Admitting: Internal Medicine

## 2016-12-01 ENCOUNTER — Ambulatory Visit: Payer: Medicare Other | Admitting: Cardiovascular Disease

## 2016-12-01 ENCOUNTER — Ambulatory Visit (INDEPENDENT_AMBULATORY_CARE_PROVIDER_SITE_OTHER): Payer: Medicare Other | Admitting: Cardiovascular Disease

## 2016-12-01 ENCOUNTER — Encounter: Payer: Self-pay | Admitting: Cardiovascular Disease

## 2016-12-01 ENCOUNTER — Encounter: Payer: Self-pay | Admitting: Internal Medicine

## 2016-12-01 VITALS — BP 126/72 | HR 99 | Ht 63.0 in | Wt 231.0 lb

## 2016-12-01 VITALS — BP 158/98 | HR 81 | Temp 97.8°F | Ht 63.0 in | Wt 231.1 lb

## 2016-12-01 DIAGNOSIS — N183 Chronic kidney disease, stage 3 unspecified: Secondary | ICD-10-CM | POA: Insufficient documentation

## 2016-12-01 DIAGNOSIS — I351 Nonrheumatic aortic (valve) insufficiency: Secondary | ICD-10-CM

## 2016-12-01 DIAGNOSIS — M79604 Pain in right leg: Secondary | ICD-10-CM

## 2016-12-01 DIAGNOSIS — I350 Nonrheumatic aortic (valve) stenosis: Secondary | ICD-10-CM

## 2016-12-01 DIAGNOSIS — R0602 Shortness of breath: Secondary | ICD-10-CM | POA: Diagnosis not present

## 2016-12-01 DIAGNOSIS — M545 Low back pain: Secondary | ICD-10-CM

## 2016-12-01 DIAGNOSIS — I10 Essential (primary) hypertension: Secondary | ICD-10-CM

## 2016-12-01 DIAGNOSIS — N281 Cyst of kidney, acquired: Secondary | ICD-10-CM | POA: Diagnosis not present

## 2016-12-01 DIAGNOSIS — Z6841 Body Mass Index (BMI) 40.0 and over, adult: Secondary | ICD-10-CM

## 2016-12-01 LAB — GC/CHLAMYDIA PROBE AMP (~~LOC~~) NOT AT ARMC
Chlamydia: NEGATIVE
Neisseria Gonorrhea: NEGATIVE

## 2016-12-01 LAB — RPR: RPR: NONREACTIVE

## 2016-12-01 LAB — HIV 1/2 AB DIFFERENTIATION
HIV 1 Ab: POSITIVE — AB
HIV 2 AB: NEGATIVE

## 2016-12-01 LAB — HIV ANTIBODY (ROUTINE TESTING W REFLEX): HIV SCREEN 4TH GENERATION: REACTIVE — AB

## 2016-12-01 MED ORDER — ALBUTEROL SULFATE HFA 108 (90 BASE) MCG/ACT IN AERS: 2.0000 | INHALATION_SPRAY | RESPIRATORY_TRACT | 3 refills | Status: DC | PRN

## 2016-12-01 MED ORDER — HYDRALAZINE HCL 25 MG PO TABS: 25.0000 mg | ORAL_TABLET | ORAL | 5 refills | Status: DC | PRN

## 2016-12-01 NOTE — Progress Notes (Signed)
Cardiology Office Note   Date:  12/01/2016   ID:  Shannon [name], DOB August 25, 1955, MRN [id number]  PCP:  Lucious Groves, DO  Cardiologist:   Skeet Latch, MD  ID: [name], MD  Chief Complaint  Patient presents with  . Follow-up    one month;  [name] Shortness of Breath    due to asthma      History of Present Illness: Shannon [name] is a 61 y.o. female with hypertension, asthma, non-obstructive CAD, OSA, prior PE, moderate pulmonary hypertension, and HIV (well-controlled)  who presents for follow up.  Shannon [name] was initially referred by her infectious disease doctor, [name], MD, on 12/26/15. [name] was concerned about her lower extremity edema and shortness of breath.  She was started on lasix 20 mg every other day.  Shannon [name] noted shortness of breath since her hospitalization for pneumonia 06/2015.   She denied lower extremity edema or orthopnea but did report chest pain when laying down at night.  She was referred for an echo 01/2016 that revealed LVEF 60-65% awith grade 1 diastolic dysfunction and hypokinesis of the basal inferior wall.  She also had very mild aortic stenosis with a mean gradient of 9 mmHg and trivial AR.  PASP was 52 mmHg.  She had a Lexiscan Myoview at that time that was negative for ischemia.  Shannon [name] previously had a Lexiscan Cardiolite in September 2013 that revealed a medium, mild reversible defect in the anterior wall and normal systolic function. She subsequently underwent cardiac catheterization that revealed nonobstructive coronary disease. She had mild pulmonary hypertension and a normal cardiac output. Left ventriculography revealed mild to moderate aortic regurgitation. She had an echo May 2016 that revealed an ejection fraction of 17% and mild diastolic dysfunction. It also revealed calcification of the aortic valve, mild to moderate aortic regurgitation, and mild aortic stenosis. Peak velocity was 2.1 m/s. She had a 24-hour Holter in February  2015 that revealed rare PACs and PVCs.  Shannon [name] has struggled with blood pressure control.  She was admitted to the hospital 08/2016 due to dyspnea.  No infectious source was found and she was not volume overloaded at the time. She was noted to have mild hypoxia at rest. It was felt to be due to a viral upper respiratory infection and a possible flare of At her last appointment Shannon [name]'s blood pressure was poorly-controlled. Furosemide was discontinued  and she was started on hydrochlorothiazide 25 mg daily. Since then she continues to have episodes of very high BP.  It was 215/110 on 11/24/16 and she called EMS.  They reportedly found a BP of 168/104.   She reportedly had not been taking her medications at present as prescribed. Her blood pressure was similarly high when she saw her PCP.  No changes were made at that time. She reports that she has been taking carvedilol 12.5 mg instead of 25 mg. However she isn't taking all other medicines as prescribed. She continues to have shortness of breath that she attributes to her lipodystrophy and central adiposity. She continues to study at [employer] and struggles with anxiety.   Past Medical History:  Diagnosis Date  . Anemia   . Anxiety    HX PANIC ATTACKS  . Arthritis    "starting to; in my hands" (07/09/2015)  . Chronic asthma with acute exacerbation    "I have chronic asthma all the time; sometimes exacerbations" (07/09/2015)  . Chronic lower back pain   .  Cyst of right kidney    "3 of them; dx'd in ~ 01/2015"  . GERD (gastroesophageal reflux disease)   . Heart murmur   . History of blood transfusion    "related to my brain surgery I think"  . History of pulmonary embolism 07/09/2015  . HIV antibody positive ([place])   . HIV disease ([place])   . Hyperlipidemia   . Hypertension   . Lipodystrophy   . Pneumonia 07/09/2015  . Sleep apnea    "never completed part 2 of study; never wore mask" (07/09/2015)    Past Surgical History:  Procedure Laterality  Date  . ABDOMINAL HYSTERECTOMY     "robotic laparosopic"  . BRAIN SURGERY  1974   "brain tumor; benign; on top of my brain; got a plate in there"  . CARDIAC CATHETERIZATION    . TONSILLECTOMY AND ADENOIDECTOMY       Current Outpatient Prescriptions  Medication Sig Dispense Refill  . acetaminophen (TYLENOL) 500 MG tablet Take 500 mg by mouth every 6 (six) hours as needed for mild pain.     [name] albuterol (PROAIR HFA) 108 (90 Base) MCG/ACT inhaler Inhale 2 puffs into the lungs every 4 (four) hours as needed for wheezing or shortness of breath. 6.7 g 3  . albuterol (PROVENTIL) (2.5 MG/3ML) 0.083% nebulizer solution Take 3 mLs (2.5 mg total) by nebulization every 6 (six) hours as needed for wheezing or shortness of breath. 75 mL 3  . Ascorbic Acid (VITAMIN C) 1000 MG tablet Take 1,000 mg by mouth daily.    . budesonide (PULMICORT) 0.25 MG/2ML nebulizer solution Take 2 mLs (0.25 mg total) by nebulization 2 (two) times daily. 60 mL 12  . carvedilol (COREG) 12.5 MG tablet Take 12.5 mg by mouth 2 (two) times daily with a meal.    . citalopram (CELEXA) 10 MG tablet Take 1 tablet (10 mg total) by mouth daily. 30 tablet 2  . cyanocobalamin (,VITAMIN B-12,) 1000 MCG/ML injection Inject 1 mL (1,000 mcg total) into the muscle every 30 (thirty) days. 1 mL 0  . dolutegravir (TIVICAY) 50 MG tablet Take 1 tablet (50 mg total) by mouth daily. 30 tablet 11  . emtricitabine-tenofovir AF (DESCOVY) 200-25 MG tablet Take 1 tablet by mouth daily. 30 tablet 11  . fluticasone (FLONASE) 50 MCG/ACT nasal spray Place 2 sprays into both nostrils daily. 16 g 5  . loratadine (CLARITIN) 10 MG tablet Take 1 tablet (10 mg total) by mouth daily as needed for allergies. 30 tablet 2  . losartan-hydrochlorothiazide (HYZAAR) 100-25 MG tablet Take 1 tablet by mouth daily. 30 tablet 5  . Multiple Vitamin (MULTIVITAMIN WITH MINERALS) TABS tablet Take 1 tablet by mouth daily. (Patient taking differently: Take 1 tablet by mouth daily as  needed (immune support). ) 90 tablet   . NIFEdipine (PROCARDIA XL/ADALAT-CC) 60 MG 24 hr tablet Take 1 tablet (60 mg total) by mouth daily. 30 tablet 11  . POTASSIUM PO Take 1 tablet by mouth daily as needed. With lasix    . PREMARIN vaginal cream Place 1 application vaginally once a week.    . SYRINGE/NEEDLE, DISP, 1 ML (B-D SYRINGE/NEEDLE 1CC/25GX5/8) 25G X 5/8" 1 ML MISC 1 Units by Does not apply route every 30 (thirty) days. 50 each 0  . Tetrahydrozoline HCl (VISINE OP) Place 2 drops into both eyes as needed (for dry eyes).    . hydrALAZINE (APRESOLINE) 25 MG tablet Take 1 tablet (25 mg total) by mouth every 4 (four) hours as needed (  FOR SYSTOLIC BLOOD PRESSURE ABOVE 160). 30 tablet 5   No current facility-administered medications for this visit.     Allergies:   Tree extract and Ciprofloxacin    Social History:  The patient  reports that she has never smoked. She has never used smokeless tobacco. She reports that she drinks alcohol. She reports that she does not use drugs.   Family History:  The patient's family history includes Asthma in her mother; Diabetes in her sister; Heart failure in her mother; Heart murmur in her brother, sister, and sister; Thyroid disease in her sister.    ROS:  Please see the history of present illness.   Otherwise, review of systems are positive for dry mouth.   All other systems are reviewed and negative.   PHYSICAL EXAM: VS:  BP 126/72   Pulse 99   Ht '5\' 3"'$  (1.6 m)   Wt 104.8 kg (231 lb)   BMI 40.92 kg/m  , BMI Body mass index is 40.92 kg/m. GENERAL:  Well appearing HEENT:  Pupils equal round and reactive, fundi not visualized, oral mucosa unremarkable NECK:  No jugular venous distention, waveform within normal limits, carotid upstroke brisk and symmetric, no bruits LYMPHATICS:  No cervical adenopathy LUNGS:  Clear to auscultation bilaterally HEART:  RRR.  PMI not displaced or sustained,S1 and S2 within normal limits, no S3, no S4, no clicks, no  rubs, II/VI early-peaking systolic murmur at the right upper sternal border. I/IV diastolic murmur at the left upper sternal border.  ABD:  Central adiposity. Positive bowel sounds normal in frequency in pitch, no bruits, no rebound, no guarding, no midline pulsatile mass, no hepatomegaly, no splenomegaly EXT:  2 plus pulses throughout, no edema, no cyanosis no clubbing SKIN:  No rashes no nodules NEURO:  Cranial nerves II through XII grossly intact, motor grossly intact throughout PSYCH:  Cognitively intact, oriented to person place and time   EKG:  EKG is not ordered today. The ekg ordered 12/31/15 shows sinus rhythm. Rate 89 bpm. 2 PVCs. 10/28/16: Sinus rhythm. Rate 93 bpm  Coronary angiography 06/01/12:10-20% OM, diffuse 20-30% LAD, 10-20% D1, 20% diffuse RCA RA 10, RV 39/12, RV EDP 12, capillary wedge pressure 19, PA 39/21, mean 27, LV 116/14, LVEDP 20, aorta 115/77 Cardiac output (Fick) 6.55, cardiac index 3.2 PVR 1.7 with units, SVR 12 with units. She P/QRS ratio 1 LVEF 80%. Aortic root mildly dilated. 1-2+ aortic regurgitation.  24-hour Holter 09/26/13: Sinus rhythm, rare PACs, rare PVCs  Echo 01/22/15: LVEF 70%. Mild LVH. Mild diastolic dysfunction. Normal RV function. Left atrium moderately enlarged. Right atrium mildly enlarged. Trace mitral regurgitation. Calcific aortic valve. Mild to moderate aortic regurgitation. Mild aortic stenosis. Peak velocity 2.1 m/s trace tricuspid regurgitation    Lexiscan Myoview 01/2016:   The left ventricular ejection fraction is normal (55-65%). The EF is 60% visually. The computer generated EF is not calculated correctly and is therefore not reported.  There was no ST segment deviation noted during stress.  The study is normal. no ischemia . no infarction   Echo 01/29/16: Study Conclusions  - Left ventricle: The cavity size was normal. Wall thickness was   increased in a pattern of moderate LVH. Systolic function was   normal. The estimated  ejection fraction was in the range of 60%   to 65%. Basal inferior hypokinesis. Doppler parameters are   consistent with abnormal left ventricular relaxation (grade 1   diastolic dysfunction). - Aortic valve: Trileaflet; moderately calcified leaflets. There  was very mild stenosis. There was mild regurgitation. Mean   gradient (S): 9 mm Hg. Valve area (VTI): 1.68 cm^2. - Mitral valve: There was trivial regurgitation. - Left atrium: The atrium was mildly dilated. - Right ventricle: The cavity size was normal. Systolic function   was normal. - Tricuspid valve: Peak RV-RA gradient (S): 44 mm Hg. - Pulmonary arteries: PA peak pressure: 52 mm Hg (S). - Systemic veins: IVC measured 2.3 cm with > 50% respirophasic   variation, suggesting RA pressure 8 mmHg.  Impressions:  - Normal LV size with moderate LV hypertrophy. EF 60-65%. Basal   inferior hypokinesis. Normal RV size and systolic function. Very   mild aortic stenosis, mild aortic insufficieny. Moderate   pulmonary hypertension.   Recent Labs: 03/31/2016: ALT 15 09/18/2016: B Natriuretic Peptide 103.6 11/30/2016: BUN 21; Creatinine, Ser 1.18; Hemoglobin 13.0; Platelets 169; Potassium 3.4; Sodium 140    Lipid Panel    Component Value Date/Time   CHOL 217 (H) 11/26/2016 1618   TRIG 88 11/26/2016 1618   HDL 67 11/26/2016 1618   CHOLHDL 3.2 11/26/2016 1618   VLDL 18 11/26/2016 1618   LDLCALC 132 (H) 11/26/2016 1618      Wt Readings from Last 3 Encounters:  12/01/16 104.8 kg (231 lb)  12/01/16 104.8 kg (231 lb 1.6 oz)  11/30/16 101.6 kg (224 lb)      ASSESSMENT AND PLAN:  # Hypertension:  BP is well-controlled both initially and on repeat.  We discussed the importance of taking her medications as prescribed. We will also give her prescription for hydralazine 25 mg to be taken as needed for systolic blood pressures greater than 160. She expressed understanding. She had a renal ultrasound that showed cysts but there was no  atrophy, making significant renal artery stenosis unlikely.  She is already scheduled for an MRI, so we will not get renal artery Dopplers at this time.   # Aortic regurgitation # Aortic stenosis: Shannon [name] has very mild aortic stenosis (mean gradient 9 mmHg) and mild aortic regurgitation.  Continue to monitor.    Current medicines are reviewed at length with the patient today.  The patient does not have concerns regarding medicines.  The following changes have been made:  Start hydralazine 25 mg q4h as needed.   Time spent: 30 minutes-Greater than 50% of this time was spent in counseling, explanation of diagnosis, planning of further management, and coordination of care.  Labs/ tests ordered today include:   No orders of the defined types were placed in this encounter.    Disposition:   FU with Regan Mcbryar C. Oval Linsey, MD, [facility] in 4 months   This note was written with the assistance of speech recognition software.  Please excuse any transcriptional errors.  Signed, Masiah Lewing C. Oval Linsey, MD, [facility]  12/01/2016 5:36 PM    [place]

## 2016-12-01 NOTE — Patient Instructions (Signed)
Shannon [name],  I am going to check some labs on you today. We will get some imaging of your kidneys today further evaluate the cysts on your kidneys. It is likely that one of the cysts ruptured and is causing your pain. The best thing to take for that is Tylenol as needed for pain.   We will get you set up to see the nutritionist as well.   Please follow up in clinic in a month for follow up on your imaging.

## 2016-12-01 NOTE — Progress Notes (Signed)
CC: Back pain  HPI:  Shannon [name] is a 61 y.o. female with a past medical history listed below here today with complaints of weight loss.  For details of today's visit and the status of her chronic medical issues please refer to the assessment and plan.  [name] has been seen in the ED multiple times in the past 2 weeks. Initial visit was on 4/3 at which time she had complaints of chest pain and shortness of breath. Per the EDP note it appears that she was convinced that her issue was due to her central obesity believing that her abdomen pushes up in her chest making it difficult to breathe. Requested referral to weight management at that time. She went to the ED again yesterday on 4/9 with multiple complaints but most concerning for her was right lower back pain with urinary urgency. She voiced concerns about weight loss at that time as well. She had a UA done which showed rare bacteria and small leukocytes otherwise normal. BMET was only notable for mild hypokalemia (3.4) and CBC was unremarkable. Wet prep was done that showed yeast; treated with a dose of fluconazole. GC/Chlamydia probe was negative. RPR negative. CT abdomen renal stone study showed no stones. It was notable for several masses in each kidney with higher attenuation that expected for simple cysts. Renal US was done and showed renal cysts bilaterally with no obstruction.   It appears she made calls to the clinic yesterday afternoon concerned regarding weight loss. Reported at that time that she 'know(s) her body, I know something is wrong.' Asked for referrals to bariatric clinic, pulmonology and endocrinology at that time.   Today, she has complaints of right sided lower back pain. Believes the pain in back is coming from her renal cysts as well as her abdominal fat compressing her kidneys. She is very concerned about her kidney function. Requesting a referral to a nephrologist today. Describes the pain as a stabbing pain  that is constant. Radiates around her groin and down the back of her leg; more burning sensation. Reports numbness in her toes and feet. Having urinary urgency and urinates on herself. No problems with bowel movements but feels constipated; has bowel movements daily. Feels weak in her bilateral legs. Having difficulty walking. Using a walker to help.   Weight today is 231 lbs. Going back to initial encounters in 07/2015 her weight has fluctuated from 223-235 lbs and has been stable as of late with no indication of weight loss. Today, requesting a referral to bariatric clinic. Believes she is losing muscle mass and gaining fat instead.   Past Medical History:  Diagnosis Date  . Anemia   . Anxiety    HX PANIC ATTACKS  . Arthritis    "starting to; in my hands" (07/09/2015)  . Chronic asthma with acute exacerbation    "I have chronic asthma all the time; sometimes exacerbations" (07/09/2015)  . Chronic lower back pain   . Cyst of right kidney    "3 of them; dx'd in ~ 01/2015"  . GERD (gastroesophageal reflux disease)   . Heart murmur   . History of blood transfusion    "related to my brain surgery I think"  . History of pulmonary embolism 07/09/2015  . HIV antibody positive ([place])   . HIV disease ([place])   . Hyperlipidemia   . Hypertension   . Lipodystrophy   . Pneumonia 07/09/2015  . Sleep apnea    "never completed part 2 of  study; never wore mask" (07/09/2015)    Review of Systems:   See HPI  Physical Exam:  Vitals:   12/01/16 0840  BP: (!) 158/86  Pulse: 87  Temp: 97.8 F (36.6 C)  TempSrc: Oral  SpO2: 95%  Weight: 231 lb 1.6 oz (104.8 kg)  Height: '5\' 3"'$  (1.6 m)   Physical Exam  Constitutional: She is oriented to person, place, and time.  Anxious appearing morbidly obese female sitting upright in a chair in no acute distress  Cardiovascular: Normal rate and regular rhythm.   Pulmonary/Chest: Effort normal and breath sounds normal.  Abdominal: Soft. Bowel sounds are  normal. She exhibits no distension. There is no tenderness.  Musculoskeletal:       Arms: Tenderness to palpation over right lumbar paraspinal muscles  Neurological: She is alert and oriented to person, place, and time. She has normal reflexes and intact cranial nerves.  Strength 5/5 in bilateral upper extremities. 3/5 strength in bilateral proximal lower extremities. Only 2/5 strength with dorsiflexion bilaterally. Otherwise strength 5/5 throughout. Sensation intact throughout.   Skin: Skin is warm and dry.    Assessment & Plan:   See Encounters Tab for problem based charting.  Patient discussed with Dr. Angelia Mould

## 2016-12-01 NOTE — Patient Instructions (Addendum)
Medication Instructions:  START HYDRALAZINE 25 MG 1 EVERY 4 HOURS AS NEEDED FOR SYSTOLIC (TOP NUMBER) ABOVE 160   Labwork: NONE  Testing/Procedures: NONE  Follow-Up: Your physician wants you to follow-up in: 3 MONTH OV  You will receive a reminder letter in the mail two months in advance. If you don't receive a letter, please call our office to schedule the follow-up appointment.  If you need a refill on your cardiac medications before your next appointment, please call your pharmacy.

## 2016-12-02 DIAGNOSIS — N281 Cyst of kidney, acquired: Secondary | ICD-10-CM | POA: Insufficient documentation

## 2016-12-02 DIAGNOSIS — M79604 Pain in right leg: Secondary | ICD-10-CM | POA: Insufficient documentation

## 2016-12-02 DIAGNOSIS — M545 Low back pain: Secondary | ICD-10-CM | POA: Insufficient documentation

## 2016-12-02 LAB — PTH, INTACT AND CALCIUM
CALCIUM: 10 mg/dL (ref 8.7–10.3)
PTH: 35 pg/mL (ref 15–65)

## 2016-12-02 LAB — VITAMIN D 25 HYDROXY (VIT D DEFICIENCY, FRACTURES): Vit D, 25-Hydroxy: 30.2 ng/mL (ref 30.0–100.0)

## 2016-12-02 LAB — VITAMIN B12: Vitamin B-12: 997 pg/mL (ref 232–1245)

## 2016-12-02 NOTE — Assessment & Plan Note (Addendum)
CT abdomen done in the ED notable for bilateral renal cysts. Previous imaging on file only commented on right sided renal cysts. Renal US noted that these cysts do not appear to represent simple cysts. Renal function was stable with Cr. Of 1.18 and GFR 58. Radiology recommended further evaluation with pre and post contrast MRI.  Assessment: Renal Cysts, bilateral  Plan: Will obtain MRI for further characterization

## 2016-12-02 NOTE — Assessment & Plan Note (Signed)
Patient with morbid obesity. Discussed the importance of eating healthy and appropriate exercise. She reports she has 'tried everything.' Requesting referral to specialist today. After discussion she was agreeable to meeting with our nutritionist.  Referral to nutritionist today

## 2016-12-02 NOTE — Assessment & Plan Note (Signed)
Renal function stable with Cr 1.18 and GFR 58 on recent labs. Patient is very concerned about her renal function and requests referral to nephrology. Discussed with her the stage of her kidney dysfunction and that it is currently stable. No indicaiton to refer to nephrology currently.   Assessment: CKD III, stable  Plan: PTH and Vit D both wnl today. Can consider checking phos with next set of labs. Continue to monitor renal function.

## 2016-12-02 NOTE — Assessment & Plan Note (Signed)
Complaints of right lower back pain. Pinpoint tenderness over the musculature of the area. CT abdomen done in the ED showed bilateral renal cysts. Likely secondary to ruptured cysts. Difficult to ascertain for true alarm symptoms as patient is very anxious and reports mostly pan positive review of systems. Exam demonstrates weakness in her bilateral LE proximal muscles and weakness in dorsiflexion bilaterally. Exam somewhat limited due to patient effort however.   Assessment: Right lower back pain likely 2/2 ruptured renal cysts  Plan: Obtaining abdominal MRI to further characterize her renal cysts.

## 2016-12-07 NOTE — Progress Notes (Signed)
Internal Medicine Clinic Attending  Case discussed with Dr. Boswell at the time of the visit.  We reviewed the resident's history and exam and pertinent patient test results.  I agree with the assessment, diagnosis, and plan of care documented in the resident's note.  

## 2016-12-10 ENCOUNTER — Telehealth: Payer: Self-pay | Admitting: Internal Medicine

## 2016-12-10 ENCOUNTER — Encounter: Payer: Self-pay | Admitting: Internal Medicine

## 2016-12-10 NOTE — Telephone Encounter (Signed)
Patient is requesting a GI referral

## 2016-12-11 DIAGNOSIS — J189 Pneumonia, unspecified organism: Secondary | ICD-10-CM | POA: Diagnosis not present

## 2016-12-11 DIAGNOSIS — G4733 Obstructive sleep apnea (adult) (pediatric): Secondary | ICD-10-CM | POA: Diagnosis not present

## 2016-12-14 ENCOUNTER — Telehealth: Payer: Self-pay | Admitting: *Deleted

## 2016-12-14 NOTE — Telephone Encounter (Signed)
Patient called several times stating that she needs to be seen sooner than her scheduled appt. She advised she has a rash, is losing lots of body muscle mass and bone density. She also complained of shortness of breath. Asked the patient if she has seen the pulmonologist or her PCP she advised this is a HIV issue and they can not help. She advised she knows what she is talking about because she has had HIV for 20+ years and just needs to see her CDC doctor. Advised her her doctor is out of the office as she was told last week and she insisted that she needs to be seen this week or she is going back to the ED and she has been there several times in the past month and they do not help either. Attempted to talk to the patient to see her pulmonologist and PCP and she refused scheduled her in first available spot 12/15/16 at 145 with Dr Tommy Medal.

## 2016-12-15 ENCOUNTER — Ambulatory Visit: Payer: Medicare Other | Admitting: Infectious Disease

## 2016-12-15 DIAGNOSIS — B029 Zoster without complications: Secondary | ICD-10-CM | POA: Diagnosis not present

## 2016-12-16 ENCOUNTER — Telehealth: Payer: Self-pay | Admitting: Pulmonary Disease

## 2016-12-16 NOTE — Telephone Encounter (Signed)
Spoke with pt  She states she thought she was returning our call  She states we called her today and left a msg, however, she was unsure who called or what it was in regards to  I advised that I could not see that anyone called  Nothing further needed

## 2016-12-17 ENCOUNTER — Telehealth: Payer: Self-pay | Admitting: Internal Medicine

## 2016-12-17 ENCOUNTER — Encounter: Payer: Self-pay | Admitting: Internal Medicine

## 2016-12-17 ENCOUNTER — Ambulatory Visit (HOSPITAL_COMMUNITY): Admission: RE | Admit: 2016-12-17 | Payer: Medicare Other | Source: Ambulatory Visit

## 2016-12-17 NOTE — Telephone Encounter (Signed)
From the EMR it appears she called [facility] on Monday for a rash and was offered an appointment the next day (she apparently insisted that she see ID versus our office).  I do not see that she was actually seen on Tuesday by infectious disease.  I do not see any records from dermatology in our office. Shingles is typically treated with Acyclovir and not antibiotics.  If there actually was a secondary bacterial infectious I suspect that the dermatologist would have prescribed an antibiotic as that is well within their scope of practice.  However Shannon [name] has poor health literacy and I suspect she may think she needs an antibiotic when she does not and it was deferred to PCP.  I would not recommend prescribing an antibiotic over the phone for a "shingles rash."  I could see her in conjunction with a resident on May 1st (Tuesday) as I am scheduled to be precepting in the clinic.

## 2016-12-17 NOTE — Telephone Encounter (Signed)
Patient seen on Monday by Dermatologist and state she has the "Shingles".  Notes were requested and received and placed in box.  Patient would like a call back because she is concerned.  Patient is requesting a call back as soon as possible.

## 2016-12-17 NOTE — Telephone Encounter (Signed)
Pt reports that she went to dermatologist Monday and was diag. w/ shingles, started at waist and now going down L thigh, she was prescribed acyclovir and told she needs to have her pcp prescribe an abx, she is very distraught and says she needs help asap, states it is causing infection throughout her body, she does not want to see anyone but dr Heber Winchester if she comes in. Dr Heber Gorst do you want her scheduled- there is nothing left in [facility] for the day but that does not mean we cant see her, she is just adamant that she wants to see you. Please advise, she states she does not really have transportation today, but will try if only you can see her, or in the am. Please advise

## 2016-12-18 ENCOUNTER — Telehealth: Payer: Self-pay | Admitting: Cardiovascular Disease

## 2016-12-18 ENCOUNTER — Ambulatory Visit (INDEPENDENT_AMBULATORY_CARE_PROVIDER_SITE_OTHER): Payer: Medicare Other | Admitting: Internal Medicine

## 2016-12-18 ENCOUNTER — Encounter: Payer: Self-pay | Admitting: Internal Medicine

## 2016-12-18 VITALS — BP 157/82 | HR 94 | Temp 98.1°F | Ht 63.0 in | Wt 230.8 lb

## 2016-12-18 DIAGNOSIS — B029 Zoster without complications: Secondary | ICD-10-CM | POA: Diagnosis not present

## 2016-12-18 DIAGNOSIS — J441 Chronic obstructive pulmonary disease with (acute) exacerbation: Secondary | ICD-10-CM

## 2016-12-18 MED ORDER — PREDNISONE 50 MG PO TABS: 50.0000 mg | ORAL_TABLET | Freq: Every day | ORAL | 0 refills | Status: DC

## 2016-12-18 MED ORDER — AZITHROMYCIN 250 MG PO TABS: ORAL_TABLET | ORAL | 0 refills | Status: DC

## 2016-12-18 NOTE — Telephone Encounter (Signed)
New message    Pt is calling to let Dr. Oval Linsey know she has shingles. She said she thinks that is why her bp is high.

## 2016-12-18 NOTE — Progress Notes (Signed)
   CC: Shingles  HPI:  Shannon [name] is a 61 y.o. female with a past medical history listed below here today for with complaints of shingles rash.   [name] was recently seen by her dermatologist on 12/16/16 complaints of 1 week duration of rash on her left thigh. Her dermatologist thought this was either shingles or hsv and gave her a prescription for Valtrex 1 gm bid #14. She reports that the rash is improving today. Still some pain but much improved.   She reports that she is very upset that she has been sick for months and been treated for bacterial infection after bacterial infection when it was a viral infection and she had to go to her dermatologist to be diagnosed correctly. Appears to have gotten a course of Azithromycin in February for sinus infection and a course of Augmentin in march for a recurrent sinus infection.   Today, she has complaints of increasing shortness of breath over the past week and a half. Using albuterol inhaler 4-5 times a day. New cough with greenish-yellow sputum production. No fevers; T 97-98 at home. Does report chills. No nausea, vomiting, abdominal pain or diarrhea. No chest pain. Requesting a prescription for azithromycin today.   Past Medical History:  Diagnosis Date  . Anemia   . Anxiety    HX PANIC ATTACKS  . Arthritis    "starting to; in my hands" (07/09/2015)  . Chronic asthma with acute exacerbation    "I have chronic asthma all the time; sometimes exacerbations" (07/09/2015)  . Chronic lower back pain   . Cyst of right kidney    "3 of them; dx'd in ~ 01/2015"  . GERD (gastroesophageal reflux disease)   . Heart murmur   . History of blood transfusion    "related to my brain surgery I think"  . History of pulmonary embolism 07/09/2015  . HIV antibody positive ([place])   . HIV disease ([place])   . Hyperlipidemia   . Hypertension   . Lipodystrophy   . Pneumonia 07/09/2015  . Sleep apnea    "never completed part 2 of study; never wore mask"  (07/09/2015)    Review of Systems:   Review of Systems  Constitutional: Positive for chills and malaise/fatigue. Negative for fever.  Respiratory: Positive for cough, sputum production and shortness of breath.   Cardiovascular: Negative for chest pain.  Skin: Positive for itching and rash.    Physical Exam:  Vitals:   12/18/16 0928  BP: (!) 157/82  Pulse: 94  Temp: 98.1 F (36.7 C)  TempSrc: Oral  SpO2: 91%  Weight: 230 lb 12.8 oz (104.7 kg)  Height: '5\' 3"'$  (1.6 m)   Physical Exam  Constitutional: She is oriented to person, place, and time and well-developed, well-nourished, and in no distress. No distress.  Cardiovascular: Normal rate and regular rhythm.   Pulmonary/Chest: Effort normal. No respiratory distress. She has wheezes. She has no rales. She exhibits no tenderness.  Abdominal: Soft. Bowel sounds are normal.  Neurological: She is alert and oriented to person, place, and time.  Skin: Skin is warm and dry.  Erythematous maculopapular rash across left upper thigh  Vitals reviewed.   Assessment & Plan:   See Encounters Tab for problem based charting.  Patient discussed with Dr. Evette Doffing

## 2016-12-18 NOTE — Patient Instructions (Signed)
Shannon [name],  I am sending in a prescription for azithromycin and prednisone for you for you respiratory problems.  Continue he valtrex as before.  Please reschedule to see Dr. Heber Carth[place] when you are feeling better.

## 2016-12-20 DIAGNOSIS — B029 Zoster without complications: Secondary | ICD-10-CM | POA: Insufficient documentation

## 2016-12-20 NOTE — Assessment & Plan Note (Signed)
Will treat for COPD exacerbation today.  -Prednisone 40 mg daily x 5 day -Azithromycin 500 mg x1 day, followed by 250 mg x 4 days

## 2016-12-20 NOTE — Assessment & Plan Note (Signed)
Continue course of Valterx

## 2016-12-21 NOTE — Progress Notes (Signed)
Internal Medicine Clinic Attending  Case discussed with Dr. Boswell at the time of the visit.  We reviewed the resident's history and exam and pertinent patient test results.  I agree with the assessment, diagnosis, and plan of care documented in the resident's note.  

## 2016-12-23 ENCOUNTER — Ambulatory Visit: Payer: Medicare Other | Admitting: Internal Medicine

## 2016-12-24 ENCOUNTER — Ambulatory Visit: Payer: Medicare Other | Admitting: Internal Medicine

## 2016-12-25 ENCOUNTER — Other Ambulatory Visit: Payer: Self-pay | Admitting: Internal Medicine

## 2016-12-25 DIAGNOSIS — B20 Human immunodeficiency virus [HIV] disease: Secondary | ICD-10-CM

## 2016-12-31 ENCOUNTER — Ambulatory Visit: Payer: Medicare Other | Admitting: Internal Medicine

## 2016-12-31 ENCOUNTER — Ambulatory Visit: Payer: Medicare Other | Admitting: Dietician

## 2016-12-31 ENCOUNTER — Encounter: Payer: Self-pay | Admitting: Internal Medicine

## 2017-01-01 ENCOUNTER — Ambulatory Visit: Payer: Medicare Other | Admitting: Dietician

## 2017-01-04 ENCOUNTER — Other Ambulatory Visit: Payer: Self-pay | Admitting: Internal Medicine

## 2017-01-04 DIAGNOSIS — B20 Human immunodeficiency virus [HIV] disease: Secondary | ICD-10-CM

## 2017-01-05 ENCOUNTER — Encounter (HOSPITAL_COMMUNITY): Payer: Self-pay | Admitting: Emergency Medicine

## 2017-01-05 ENCOUNTER — Observation Stay (HOSPITAL_COMMUNITY)
Admission: EM | Admit: 2017-01-05 | Discharge: 2017-01-06 | Disposition: A | Discharge status: Home / Self Care | Payer: Medicare Other | Attending: Internal Medicine | Admitting: Internal Medicine

## 2017-01-05 ENCOUNTER — Ambulatory Visit: Payer: Medicare Other | Admitting: Internal Medicine

## 2017-01-05 ENCOUNTER — Emergency Department (HOSPITAL_COMMUNITY): Payer: Medicare Other

## 2017-01-05 DIAGNOSIS — I272 Pulmonary hypertension, unspecified: Secondary | ICD-10-CM | POA: Insufficient documentation

## 2017-01-05 DIAGNOSIS — Z79899 Other long term (current) drug therapy: Secondary | ICD-10-CM | POA: Insufficient documentation

## 2017-01-05 DIAGNOSIS — B20 Human immunodeficiency virus [HIV] disease: Secondary | ICD-10-CM | POA: Insufficient documentation

## 2017-01-05 DIAGNOSIS — R0789 Other chest pain: Principal | ICD-10-CM | POA: Insufficient documentation

## 2017-01-05 DIAGNOSIS — I130 Hypertensive heart and chronic kidney disease with heart failure and stage 1 through stage 4 chronic kidney disease, or unspecified chronic kidney disease: Secondary | ICD-10-CM | POA: Insufficient documentation

## 2017-01-05 DIAGNOSIS — J441 Chronic obstructive pulmonary disease with (acute) exacerbation: Secondary | ICD-10-CM | POA: Diagnosis not present

## 2017-01-05 DIAGNOSIS — I5032 Chronic diastolic (congestive) heart failure: Secondary | ICD-10-CM | POA: Diagnosis not present

## 2017-01-05 DIAGNOSIS — I2510 Atherosclerotic heart disease of native coronary artery without angina pectoris: Secondary | ICD-10-CM | POA: Diagnosis not present

## 2017-01-05 DIAGNOSIS — R079 Chest pain, unspecified: Secondary | ICD-10-CM | POA: Diagnosis present

## 2017-01-05 DIAGNOSIS — N183 Chronic kidney disease, stage 3 (moderate): Secondary | ICD-10-CM | POA: Insufficient documentation

## 2017-01-05 DIAGNOSIS — R0600 Dyspnea, unspecified: Secondary | ICD-10-CM | POA: Diagnosis present

## 2017-01-05 DIAGNOSIS — I77810 Thoracic aortic ectasia: Secondary | ICD-10-CM | POA: Diagnosis not present

## 2017-01-05 DIAGNOSIS — Z86711 Personal history of pulmonary embolism: Secondary | ICD-10-CM | POA: Diagnosis not present

## 2017-01-05 DIAGNOSIS — Z6841 Body Mass Index (BMI) 40.0 and over, adult: Secondary | ICD-10-CM | POA: Insufficient documentation

## 2017-01-05 DIAGNOSIS — Z9981 Dependence on supplemental oxygen: Secondary | ICD-10-CM | POA: Diagnosis not present

## 2017-01-05 DIAGNOSIS — R0602 Shortness of breath: Secondary | ICD-10-CM

## 2017-01-05 LAB — URINALYSIS, ROUTINE W REFLEX MICROSCOPIC
Bilirubin Urine: NEGATIVE
Glucose, UA: NEGATIVE mg/dL
Hgb urine dipstick: NEGATIVE
Ketones, ur: NEGATIVE mg/dL
Leukocytes, UA: NEGATIVE
Nitrite: NEGATIVE
Protein, ur: NEGATIVE mg/dL
Specific Gravity, Urine: 1.005 (ref 1.005–1.030)
pH: 6 (ref 5.0–8.0)

## 2017-01-05 LAB — BASIC METABOLIC PANEL
ANION GAP: 8 (ref 5–15)
BUN: 16 mg/dL (ref 6–20)
CALCIUM: 9.6 mg/dL (ref 8.9–10.3)
CO2: 29 mmol/L (ref 22–32)
Chloride: 102 mmol/L (ref 101–111)
Creatinine, Ser: 1.19 mg/dL — ABNORMAL HIGH (ref 0.44–1.00)
GFR calc non Af Amer: 48 mL/min — ABNORMAL LOW (ref >60)
GFR, EST AFRICAN AMERICAN: 56 mL/min — AB (ref >60)
Glucose, Bld: 105 mg/dL — ABNORMAL HIGH (ref 65–99)
Potassium: 4 mmol/L (ref 3.5–5.1)
Sodium: 139 mmol/L (ref 135–145)

## 2017-01-05 LAB — CBC
HCT: 38.2 % (ref 36.0–46.0)
HEMOGLOBIN: 12.7 g/dL (ref 12.0–15.0)
MCH: 31.5 pg (ref 26.0–34.0)
MCHC: 33.2 g/dL (ref 30.0–36.0)
MCV: 94.8 fL (ref 78.0–100.0)
Platelets: 174 10*3/uL (ref 150–400)
RBC: 4.03 MIL/uL (ref 3.87–5.11)
RDW: 13.5 % (ref 11.5–15.5)
WBC: 8.3 10*3/uL (ref 4.0–10.5)

## 2017-01-05 LAB — TROPONIN I
Troponin I: <0.03 ng/mL (ref <0.03)
Troponin I: <0.03 ng/mL (ref <0.03)

## 2017-01-05 LAB — BRAIN NATRIURETIC PEPTIDE: B Natriuretic Peptide: 41.6 pg/mL (ref 0.0–100.0)

## 2017-01-05 LAB — I-STAT TROPONIN, ED
TROPONIN I, POC: 0 ng/mL (ref 0.00–0.08)
Troponin i, poc: 0 ng/mL (ref 0.00–0.08)

## 2017-01-05 MED ORDER — LORATADINE 10 MG PO TABS
10.0000 mg | ORAL_TABLET | Freq: Every day | ORAL | Status: DC
  Administered 2017-01-06: 10 mg via ORAL
  Filled 2017-01-05 (×2): qty 1

## 2017-01-05 MED ORDER — ALBUTEROL SULFATE (2.5 MG/3ML) 0.083% IN NEBU: 2.5000 mg | INHALATION_SOLUTION | RESPIRATORY_TRACT | Status: DC | PRN

## 2017-01-05 MED ORDER — SENNOSIDES-DOCUSATE SODIUM 8.6-50 MG PO TABS: 1.0000 | ORAL_TABLET | Freq: Every evening | ORAL | Status: DC | PRN

## 2017-01-05 MED ORDER — NIFEDIPINE ER 60 MG PO TB24
60.0000 mg | ORAL_TABLET | Freq: Every day | ORAL | Status: DC
  Administered 2017-01-06: 60 mg via ORAL
  Filled 2017-01-05: qty 1

## 2017-01-05 MED ORDER — AMOXICILLIN-POT CLAVULANATE 500-125 MG PO TABS
1.0000 | ORAL_TABLET | Freq: Three times a day (TID) | ORAL | Status: DC
  Administered 2017-01-05 – 2017-01-06 (×3): 500 mg via ORAL
  Filled 2017-01-05 (×6): qty 1

## 2017-01-05 MED ORDER — ACETAMINOPHEN 325 MG PO TABS: 650.0000 mg | ORAL_TABLET | Freq: Four times a day (QID) | ORAL | Status: DC | PRN

## 2017-01-05 MED ORDER — SODIUM CHLORIDE 0.9 % IV BOLUS (SEPSIS)
500.0000 mL | Freq: Once | INTRAVENOUS | Status: AC
  Administered 2017-01-05: 500 mL via INTRAVENOUS

## 2017-01-05 MED ORDER — HYDRALAZINE HCL 25 MG PO TABS: 25.0000 mg | ORAL_TABLET | ORAL | Status: DC | PRN

## 2017-01-05 MED ORDER — ALBUTEROL SULFATE (2.5 MG/3ML) 0.083% IN NEBU
5.0000 mg | INHALATION_SOLUTION | Freq: Once | RESPIRATORY_TRACT | Status: AC
  Administered 2017-01-05: 5 mg via RESPIRATORY_TRACT
  Filled 2017-01-05: qty 6

## 2017-01-05 MED ORDER — DOLUTEGRAVIR SODIUM 50 MG PO TABS
50.0000 mg | ORAL_TABLET | Freq: Every day | ORAL | Status: DC
Start: 2017-01-05 — End: 2017-01-06
  Administered 2017-01-06: 50 mg via ORAL
  Filled 2017-01-05 (×2): qty 1

## 2017-01-05 MED ORDER — HYDROCHLOROTHIAZIDE 25 MG PO TABS
25.0000 mg | ORAL_TABLET | Freq: Every day | ORAL | Status: DC
  Filled 2017-01-05 (×2): qty 1

## 2017-01-05 MED ORDER — VITAMIN C 500 MG PO TABS
1000.0000 mg | ORAL_TABLET | Freq: Every day | ORAL | Status: DC
  Administered 2017-01-06: 1000 mg via ORAL
  Filled 2017-01-05: qty 2

## 2017-01-05 MED ORDER — ACETAMINOPHEN 650 MG RE SUPP: 650.0000 mg | Freq: Four times a day (QID) | RECTAL | Status: DC | PRN

## 2017-01-05 MED ORDER — ONDANSETRON HCL 4 MG PO TABS: 4.0000 mg | ORAL_TABLET | Freq: Four times a day (QID) | ORAL | Status: DC | PRN

## 2017-01-05 MED ORDER — ASPIRIN EC 325 MG PO TBEC
325.0000 mg | DELAYED_RELEASE_TABLET | Freq: Once | ORAL | Status: AC
  Administered 2017-01-05: 325 mg via ORAL
  Filled 2017-01-05: qty 1

## 2017-01-05 MED ORDER — EMTRICITABINE-TENOFOVIR AF 200-25 MG PO TABS
1.0000 | ORAL_TABLET | Freq: Every day | ORAL | Status: DC
  Administered 2017-01-06: 1 via ORAL
  Filled 2017-01-05 (×2): qty 1

## 2017-01-05 MED ORDER — ENOXAPARIN SODIUM 40 MG/0.4ML ~~LOC~~ SOLN
40.0000 mg | SUBCUTANEOUS | Status: DC
  Administered 2017-01-05: 40 mg via SUBCUTANEOUS
  Filled 2017-01-05: qty 0.4

## 2017-01-05 MED ORDER — PREDNISONE 20 MG PO TABS
40.0000 mg | ORAL_TABLET | Freq: Every day | ORAL | Status: DC
  Administered 2017-01-06: 40 mg via ORAL
  Filled 2017-01-05: qty 2

## 2017-01-05 MED ORDER — CARVEDILOL 25 MG PO TABS
25.0000 mg | ORAL_TABLET | Freq: Two times a day (BID) | ORAL | Status: DC
  Administered 2017-01-05 – 2017-01-06 (×2): 25 mg via ORAL
  Filled 2017-01-05: qty 2
  Filled 2017-01-05 (×2): qty 1

## 2017-01-05 MED ORDER — LORATADINE 10 MG PO TABS: 10.0000 mg | ORAL_TABLET | Freq: Every day | ORAL | Status: DC | PRN

## 2017-01-05 MED ORDER — FLUTICASONE PROPIONATE 50 MCG/ACT NA SUSP
2.0000 | Freq: Every day | NASAL | Status: DC
  Filled 2017-01-05 (×2): qty 16

## 2017-01-05 MED ORDER — NITROGLYCERIN 0.4 MG SL SUBL: 0.4000 mg | SUBLINGUAL_TABLET | SUBLINGUAL | Status: DC | PRN

## 2017-01-05 MED ORDER — IOPAMIDOL (ISOVUE-370) INJECTION 76%
INTRAVENOUS | Status: AC
  Administered 2017-01-05: 100 mL
  Filled 2017-01-05: qty 100

## 2017-01-05 MED ORDER — TRAMADOL HCL 50 MG PO TABS: 50.0000 mg | ORAL_TABLET | Freq: Four times a day (QID) | ORAL | Status: DC | PRN

## 2017-01-05 MED ORDER — IPRATROPIUM BROMIDE 0.02 % IN SOLN
RESPIRATORY_TRACT | Status: AC
  Filled 2017-01-05: qty 2.5

## 2017-01-05 MED ORDER — IPRATROPIUM-ALBUTEROL 0.5-2.5 (3) MG/3ML IN SOLN
3.0000 mL | Freq: Four times a day (QID) | RESPIRATORY_TRACT | Status: DC
  Filled 2017-01-05: qty 3

## 2017-01-05 MED ORDER — METHYLPREDNISOLONE SODIUM SUCC 125 MG IJ SOLR
60.0000 mg | Freq: Once | INTRAMUSCULAR | Status: AC
  Administered 2017-01-05: 60 mg via INTRAVENOUS
  Filled 2017-01-05: qty 2

## 2017-01-05 MED ORDER — HYDRALAZINE HCL 25 MG PO TABS: 25.0000 mg | ORAL_TABLET | Freq: Four times a day (QID) | ORAL | Status: DC | PRN

## 2017-01-05 MED ORDER — SODIUM CHLORIDE 0.9% FLUSH
3.0000 mL | Freq: Two times a day (BID) | INTRAVENOUS | Status: DC
  Administered 2017-01-05 – 2017-01-06 (×2): 3 mL via INTRAVENOUS

## 2017-01-05 MED ORDER — IPRATROPIUM-ALBUTEROL 0.5-2.5 (3) MG/3ML IN SOLN
3.0000 mL | RESPIRATORY_TRACT | Status: DC
  Administered 2017-01-05 – 2017-01-06 (×6): 3 mL via RESPIRATORY_TRACT
  Filled 2017-01-05 (×7): qty 3

## 2017-01-05 MED ORDER — NIFEDIPINE ER 60 MG PO TB24
60.0000 mg | ORAL_TABLET | Freq: Once | ORAL | Status: AC
  Administered 2017-01-05: 60 mg via ORAL
  Filled 2017-01-05: qty 1

## 2017-01-05 MED ORDER — BUDESONIDE 0.25 MG/2ML IN SUSP
0.2500 mg | Freq: Two times a day (BID) | RESPIRATORY_TRACT | Status: DC
  Administered 2017-01-05: 0.25 mg via RESPIRATORY_TRACT
  Filled 2017-01-05 (×3): qty 2

## 2017-01-05 MED ORDER — ONDANSETRON HCL 4 MG/2ML IJ SOLN
4.0000 mg | Freq: Four times a day (QID) | INTRAMUSCULAR | Status: DC | PRN
  Administered 2017-01-06: 4 mg via INTRAVENOUS
  Filled 2017-01-05: qty 2

## 2017-01-05 MED ORDER — ALBUTEROL SULFATE (2.5 MG/3ML) 0.083% IN NEBU
INHALATION_SOLUTION | RESPIRATORY_TRACT | Status: AC
  Filled 2017-01-05: qty 6

## 2017-01-05 MED ORDER — ASPIRIN 81 MG PO CHEW
81.0000 mg | CHEWABLE_TABLET | Freq: Every day | ORAL | Status: DC
  Administered 2017-01-06: 81 mg via ORAL
  Filled 2017-01-05: qty 1

## 2017-01-05 NOTE — ED Triage Notes (Signed)
Patient arrived with EMS from home reports central chest pain radiating to left neck  with SOB onset 2 days ago , pain increases with exertion with mild dizziness , hypertensive prior to arrival 250/128 , she received 2 NTG sl by EMS . No emesis or diaphoresis .

## 2017-01-05 NOTE — ED Notes (Signed)
Lunch at bedside for patient.

## 2017-01-05 NOTE — ED Notes (Signed)
Patient transported to CT 

## 2017-01-05 NOTE — ED Provider Notes (Signed)
[facility] DEPT Provider Note   CSN: [id number] Arrival date & time: 01/05/17  0548     History   Chief Complaint Chief Complaint  Patient presents with  . Chest Pain    HPI Shannon [name] is a 61 y.o. female with history of HIV, CHF, PE, COPD, lipodystrophy, hypertension who presents with a several month history of cough, shortness of breath. Patient began having chest pain that she describes as a heaviness that has been intermittent. Patient has had pain radiating to her left neck. She said associated dizziness. Her pain has, at rest, however is worse on exertion. Patient's chest pain resolved after given 2 nitroglycerin sublingual tablets by EMS. Patient's blood pressure has been significantly elevated recently. She has not taken her blood pressure medications today, however. Patient was frustrated because she has been in the emergency department 4 times in the past month. She feels like she has an infection and believe she needs more antibiotics. Patient has been on since of azithromycin and prednisone. Patient also feels as though her shingles are recurring and that she needs Valtrex again. She has had return of pain to the area on her left thigh where she had it in the past. Patient denies any fevers, current abdominal pain, nausea, vomiting, urinary symptoms. Patient has had associated left lower extremity swelling. Patient is on 2.5-3 L of O2 at home.   Chest Pain   Associated symptoms include cough and shortness of breath. Pertinent negatives include no abdominal pain, no back pain, no fever, no headaches, no nausea and no vomiting.    Past Medical History:  Diagnosis Date  . Anemia   . Anxiety    HX PANIC ATTACKS  . Arthritis    "starting to; in my hands" (07/09/2015)  . Chronic asthma with acute exacerbation    "I have chronic asthma all the time; sometimes exacerbations" (07/09/2015)  . Chronic lower back pain   . Cyst of right kidney    "3 of them; dx'd in ~ 01/2015"   . GERD (gastroesophageal reflux disease)   . Heart murmur   . History of blood transfusion    "related to my brain surgery I think"  . History of pulmonary embolism 07/09/2015  . HIV antibody positive ([place])   . HIV disease ([place])   . Hyperlipidemia   . Hypertension   . Lipodystrophy   . Pneumonia 07/09/2015  . Sleep apnea    "never completed part 2 of study; never wore mask" (07/09/2015)    Patient Active Problem List   Diagnosis Date Noted  . Chest pain 01/05/2017  . Shingles rash 12/20/2016  . COPD exacerbation ([place]) 12/20/2016  . Low back pain radiating to right lower extremity 12/02/2016  . Bilateral renal cysts 12/02/2016  . Chronic kidney disease (CKD), stage III (moderate) 12/01/2016  . Generalized anxiety disorder 10/22/2016  . Hypersomnia 10/01/2016  . BPPV (benign paroxysmal positional vertigo) 09/18/2016  . Nocturnal hypoxemia 05/13/2016  . Cervical radiculopathy 04/14/2016  . GERD (gastroesophageal reflux disease) 01/02/2016  . Exertional dyspnea 12/04/2015  . OSA (obstructive sleep apnea) 10/10/2015  . Morbid obesity ([place]) 10/10/2015  . Vitamin B12 deficiency 10/02/2015  . Inappropriate diet and eating habits 08/15/2015  . Opacity noted on imaging study   . Dyspnea 07/09/2015  . Asthma, moderate persistent 07/09/2015  . HIV disease ([place]) 07/09/2015  . Essential hypertension 07/09/2015  . Mild diastolic dysfunction 29/52/8413    Past Surgical History:  Procedure Laterality Date  . ABDOMINAL HYSTERECTOMY     "  robotic laparosopic"  . BRAIN SURGERY  1974   "brain tumor; benign; on top of my brain; got a plate in there"  . CARDIAC CATHETERIZATION    . TONSILLECTOMY AND ADENOIDECTOMY      OB History    No data available       Home Medications    Prior to Admission medications   Medication Sig Start Date End Date Taking? Authorizing Provider  acetaminophen (TYLENOL) 500 MG tablet Take 500 mg by mouth every 6 (six) hours as needed for mild pain.     Yes [provider]  albuterol (PROAIR HFA) 108 (90 Base) MCG/ACT inhaler Inhale 2 puffs into the lungs every 4 (four) hours as needed for wheezing or shortness of breath. 12/01/16  Yes Maryellen Pile, MD  albuterol (PROVENTIL) (2.5 MG/3ML) 0.083% nebulizer solution Take 3 mLs (2.5 mg total) by nebulization every 6 (six) hours as needed for wheezing or shortness of breath. 09/19/16  Yes Minus Liberty, MD  Ascorbic Acid (VITAMIN C) 1000 MG tablet Take 1,000 mg by mouth daily.   Yes [provider]  carvedilol (COREG) 12.5 MG tablet Take 25 mg by mouth daily.    Yes [provider]  DESCOVY 200-25 MG tablet TAKE 1 TABLET BY MOUTH DAILY 12/25/16  Yes [name], MD  fluticasone [facility]) 50 MCG/ACT nasal spray Place 2 sprays into both nostrils daily. Patient taking differently: Place 2 sprays into both nostrils daily as needed for allergies.  10/01/16  Yes Lucious Groves, DO  hydrALAZINE (APRESOLINE) 25 MG tablet Take 1 tablet (25 mg total) by mouth every 4 (four) hours as needed (FOR SYSTOLIC BLOOD PRESSURE ABOVE 160). 12/01/16  Yes Skeet Latch, MD  loratadine (CLARITIN) 10 MG tablet Take 1 tablet (10 mg total) by mouth daily as needed for allergies. 06/10/16 06/10/17 Yes Alphonzo Grieve, MD  losartan-hydrochlorothiazide (HYZAAR) 100-25 MG tablet Take 1 tablet by mouth daily. Patient taking differently: Take 1 tablet by mouth daily as needed (hypertension).  10/28/16  Yes Skeet Latch, MD  Multiple Vitamin (MULTIVITAMIN WITH MINERALS) TABS tablet Take 1 tablet by mouth daily. Patient taking differently: Take 1 tablet by mouth daily as needed (immune support).  08/29/15  Yes Rice, Resa Miner, MD  NIFEdipine (PROCARDIA XL/ADALAT-CC) 60 MG 24 hr tablet Take 1 tablet (60 mg total) by mouth daily. 04/17/16 04/17/17 Yes Skeet Latch, MD  POTASSIUM PO Take 1 tablet by mouth daily as needed. With lasix   Yes [provider]  Tetrahydrozoline HCl (VISINE  OP) Place 2 drops into both eyes as needed (for dry eyes).   Yes [provider]  TIVICAY 50 MG tablet TAKE 1 TABLET BY MOUTH DAILY 12/25/16  Yes [name], MD  citalopram (CELEXA) 10 MG tablet Take 1 tablet (10 mg total) by mouth daily. Patient not taking: Reported on 01/05/2017 10/20/16 10/20/17  Ledell Noss, MD  cyanocobalamin (,VITAMIN B-12,) 1000 MCG/ML injection Inject 1 mL (1,000 mcg total) into the muscle every 30 (thirty) days. Patient not taking: Reported on 01/05/2017 11/04/16   [name], MD  SYRINGE/NEEDLE, DISP, 1 ML (B-D SYRINGE/NEEDLE 1CC/25GX5/8) 25G X 5/8" 1 ML MISC 1 Units by Does not apply route every 30 (thirty) days. Patient not taking: Reported on 01/05/2017 11/04/16   [name], MD    Family History Family History  Problem Relation Age of Onset  . Asthma Mother   . Heart failure Mother        cardiomyopathy  . Heart murmur Sister   .  Heart murmur Brother   . Diabetes Sister   . Thyroid disease Sister   . Heart murmur Sister     Social History Social History  Substance Use Topics  . Smoking status: Never Smoker  . Smokeless tobacco: Never Used  . Alcohol use 0.0 oz/week     Comment: Rarely.     Allergies   Tree extract and Ciprofloxacin   Review of Systems Review of Systems  Constitutional: Negative for chills and fever.  HENT: Negative for facial swelling and sore throat.   Respiratory: Positive for cough and shortness of breath.   Cardiovascular: Positive for chest pain and leg swelling.  Gastrointestinal: Negative for abdominal pain, nausea and vomiting.  Genitourinary: Negative for dysuria.  Musculoskeletal: Negative for back pain.  Skin: Negative for rash and wound.  Neurological: Negative for headaches.  Psychiatric/Behavioral: The patient is not nervous/anxious.      Physical Exam Updated Vital Signs BP (!) 160/89   Pulse 86   Resp (!) 22   SpO2 93%   Physical Exam  Constitutional: She appears well-developed  and well-nourished. No distress.  HENT:  Head: Normocephalic and atraumatic.  Mouth/Throat: Oropharynx is clear and moist. No oropharyngeal exudate.  Eyes: Conjunctivae are normal. Pupils are equal, round, and reactive to light. Right eye exhibits no discharge. Left eye exhibits no discharge. No scleral icterus.  Neck: Normal range of motion. Neck supple. No thyromegaly present.  Cardiovascular: Normal rate, regular rhythm, normal heart sounds and intact distal pulses.  Exam reveals no gallop and no friction rub.   No murmur heard. Pulmonary/Chest: Effort normal. No stridor. No respiratory distress. She has decreased breath sounds. She has wheezes (expiratory). She has no rales.  Abdominal: Soft. Bowel sounds are normal. She exhibits no distension. There is no tenderness. There is no rebound and no guarding.  Musculoskeletal: She exhibits edema (L foot).  L calf tenderness  Lymphadenopathy:    She has no cervical adenopathy.  Neurological: She is alert. Coordination normal.  Skin: Skin is warm and dry. No rash noted. She is not diaphoretic. No pallor.  Pain to light touch to lateral L upper thigh; < 1 cm circumferential area of erythema, no vesicles noted  Psychiatric: She has a normal mood and affect.  Nursing note and vitals reviewed.    ED Treatments / Results  Labs (all labs ordered are listed, but only abnormal results are displayed) Labs Reviewed  BASIC METABOLIC PANEL - Abnormal; Notable for the following:       Result Value   Glucose, Bld 105 (*)    Creatinine, Ser 1.19 (*)    GFR calc non Af Amer 48 (*)    GFR calc Af Amer 56 (*)    All other components within normal limits  URINALYSIS, ROUTINE W REFLEX MICROSCOPIC - Abnormal; Notable for the following:    Color, Urine STRAW (*)    All other components within normal limits  CBC  BRAIN NATRIURETIC PEPTIDE  RAPID URINE DRUG SCREEN, HOSP PERFORMED  I-STAT TROPOININ, ED  I-STAT TROPOININ, ED    EKG  EKG  Interpretation  Date/Time:  Tuesday Jan 05 2017 05:57:01 EDT Ventricular Rate:  98 PR Interval:    QRS Duration: 90 QT Interval:  376 QTC Calculation: 483 R Axis:   18 Text Interpretation:  Sinus rhythm Abnormal R-wave progression, early transition Confirmed by Thayer Jew [phone]) on 01/05/2017 6:38:34 AM       Radiology Dg Chest 2 View  Result Date: 01/05/2017 CLINICAL DATA:  Chest pain.  Wheezing and shortness of breath. EXAM: CHEST  2 VIEW COMPARISON:  Radiographs 11/24/2016, multiple priors. FINDINGS: Multiple overlying monitoring devices. Stable cardiomegaly. Unchanged mediastinal contours with tortuous thoracic aorta. Linear scarring in the lingula in right middle lobe, unchanged from prior. No pulmonary edema, consolidation, pleural fluid or pneumothorax. No acute osseous abnormalities. IMPRESSION: Stable cardiomegaly and bibasilar scarring.  No acute abnormality. Electronically Signed   By: Jeb Levering M.D.   On: 01/05/2017 06:27   Ct Angio Chest Pe W Or Wo Contrast  Result Date: 01/05/2017 CLINICAL DATA:  Shortness of breath and chest pain EXAM: CT ANGIOGRAPHY CHEST WITH CONTRAST TECHNIQUE: Multidetector CT imaging of the chest was performed using the standard protocol during bolus administration of intravenous contrast. Multiplanar CT image reconstructions and MIPs were obtained to evaluate the vascular anatomy. CONTRAST:  100 mL Isovue 370 nonionic COMPARISON:  Chest CT October 30, 2015 and chest radiograph Jan 05, 2017 FINDINGS: Cardiovascular: There is no demonstrable pulmonary embolus. Ascending thoracic aortic diameter is 4.0 x 4.0 cm. There is no thoracic aortic dissection. There is mild calcification at the origin of the left common carotid artery. There are scattered foci of coronary artery calcification. There is left ventricular hypertrophy. There is a rather minimal pericardial effusion inferiorly. Mediastinum/Nodes: Thyroid appears somewhat inhomogeneous with a left  lobe larger than the right lobe. A well-defined dominant mass in the thyroid is not evident by CT. There is no appreciable thoracic adenopathy the. Lungs/Pleura: There is mild atelectatic change in the right middle lobe and lingular regions. There is no frank edema or consolidation. No pleural effusion or pleural thickening evident. Upper Abdomen: There is a 5 x 4 mm calculus in the upper pole of the right kidney, nonobstructing. Visualized upper abdominal structures otherwise appear unremarkable. Musculoskeletal: There is degenerative change in the thoracic spine. There are no blastic or lytic bone lesions. Review of the MIP images confirms the above findings. IMPRESSION: No demonstrable pulmonary embolus. Ascending thoracic aortic diameter measures 4.0 x 4.0 cm. Recommend annual imaging followup by CTA or MRA. This recommendation follows 2010 ACCF/AHA/AATS/ACR/ASA/SCA/SCAI/SIR/STS/SVM Guidelines for the Diagnosis and Management of Patients with Thoracic Aortic Disease. Circulation. 2010; 121: [id number]. No thoracic aortic dissection. Minimal pericardial effusion. There are foci of coronary artery calcification. There is left ventricular hypertrophy. Scattered areas of mild atelectasis. No edema or consolidation. No evident adenopathy. Probable multinodular goiter in the thyroid. Completely obstructing calculus upper pole right kidney. Electronically Signed   By: Lowella Grip III M.D.   On: 01/05/2017 11:30    Procedures Procedures (including critical care time)  Medications Ordered in ED Medications  carvedilol (COREG) tablet 25 mg (25 mg Oral Given 01/05/17 0957)  albuterol (PROVENTIL) (2.5 MG/3ML) 0.083% nebulizer solution (not administered)  ipratropium (ATROVENT) 0.02 % nebulizer solution (not administered)  ipratropium-albuterol (DUONEB) 0.5-2.5 (3) MG/3ML nebulizer solution 3 mL (not administered)  sodium chloride 0.9 % bolus 500 mL (0 mLs Intravenous Stopped 01/05/17 1001)  albuterol  (PROVENTIL) (2.5 MG/3ML) 0.083% nebulizer solution 5 mg (5 mg Nebulization Given 01/05/17 0823)  aspirin EC tablet 325 mg (325 mg Oral Given 01/05/17 0812)  NIFEdipine (PROCARDIA-XL/ADALAT CC) 24 hr tablet 60 mg (60 mg Oral Given 01/05/17 0956)  iopamidol (ISOVUE-370) 76 % injection (100 mLs  Contrast Given 01/05/17 1108)  sodium chloride 0.9 % bolus 500 mL (500 mLs Intravenous New Bag/Given 01/05/17 1143)     Initial Impression / Assessment and Plan / ED Course  I have reviewed the triage vital signs  and the nursing notes.  Pertinent labs & imaging results that were available during my care of the patient were reviewed by me and considered in my medical decision making (see chart for details).     U/S guided 20G IV placed by Margarita Mail, PA-C. See her procedure note.   CBC unremarkable. BMP shows creatinine 1.1. BNP 41.6. Delta troponin negative. CXR shows stable cardiomegaly and bibasilar scarring; no acute abnormality. EKG shows NSR. CT angiogram shows no PE. Patient's breathing somewhat improved with albuterol nebulizer. Patient heart score is 4. Considering patient's story and chest pain relieved by nitroglycerin, I feel patient should be admitted for cardiac rule out and further evaluation and treatment of her symptoms. I consulted the internal medicine teaching service and spoke with Dr. Randell Patient who will admit the patient for further evaluation and treatment. Patient understands and agrees with plan. Patient's blood pressure decreased in the ED with home blood pressure medications. Patient also evaluated by Dr. Canary Brim who guided the patient's management and agrees with plan.  Final Clinical Impressions(s) / ED Diagnoses   Final diagnoses:  Shortness of breath  Chest pain, unspecified type    New Prescriptions New Prescriptions   No medications on file     Caryl Ada 01/05/17 1231    Alfonzo Beers, MD 01/05/17 1252

## 2017-01-05 NOTE — ED Notes (Signed)
Dr. Jeanell Sparrow and Aldona Bar, PA at bedside for ultrasound IV.

## 2017-01-05 NOTE — ED Provider Notes (Signed)
Procedures   EMERGENCY DEPARTMENT  US GUIDANCE EXAM Emergency Ultrasound:  US Guidance for Needle Guidance  INDICATIONS: Difficult vascular access Linear probe used in real-time to visualize location of needle entry through skin.   PERFORMED BY: Myself IMAGES ARCHIVED?: No LIMITATIONS: None VIEWS USED: Transverse INTERPRETATION: Needle visualized within vein, Right arm and AC    Margarita Mail, PA-C 01/05/17 1012    Julianne Rice, MD 01/08/17 1527

## 2017-01-05 NOTE — ED Notes (Signed)
Dr. Canary Brim at bedside at this time.

## 2017-01-05 NOTE — ED Notes (Signed)
ED Provider at bedside. 

## 2017-01-05 NOTE — H&P (Signed)
Date: 01/05/2017               Patient Name:  Shannon [name] MRN: [id number]  DOB: 1955/11/05 Age / Sex: 61 y.o., female   PCP: [name], MD         Medical Service: Internal Medicine Teaching Service         Attending Physician: Dr. Aldine Contes, MD    First Contact: Dr. Danford Bad Pager: [phone]  Second Contact: Dr. Benjamine Mola Pager: [phone]       After Hours (After 5p/  First Contact Pager: [phone]  weekends / holidays): Second Contact Pager: [phone]   Chief Complaint: chest pain  History of Present Illness: 61 year old woman with HIV, HFpEF, asthma, GERD, HLD, HTN, hx PE, anxiety presenting with chest pain. Chest pain started 2 days ago. Located in the mid lower chest to under lower breast. Sharp in nature. Intermittent. It became more continuous and frequent. Oxygen makes it better. Also relieved by nitroglycerin. She has associated diaphoresis. She also has nausea associated.   She has been having respiratory infection that she never fully recovered from for the past 2-3 months. She has been dyspneic, lethargic, generally weak. She has dyspnea with exertion. Her legs started hurting. She notes the temperature/weather has been worsening her asthma. She has sinus pain. She feels like her equilibrium is off. She has subjective fevers and chills. Measured to be 95-96 temp. A little cough productive of green mucous. Increased production. No post nasal drip. She has sneezing but no rhinorrhea.   Meds:  Current Meds  Medication Sig  . acetaminophen (TYLENOL) 500 MG tablet Take 500 mg by mouth every 6 (six) hours as needed for mild pain.   [name] albuterol (PROAIR HFA) 108 (90 Base) MCG/ACT inhaler Inhale 2 puffs into the lungs every 4 (four) hours as needed for wheezing or shortness of breath.  [name] albuterol (PROVENTIL) (2.5 MG/3ML) 0.083% nebulizer solution Take 3 mLs (2.5 mg total) by nebulization every 6 (six) hours as needed for wheezing or shortness of breath.  . Ascorbic Acid  (VITAMIN C) 1000 MG tablet Take 1,000 mg by mouth daily.  . carvedilol (COREG) 12.5 MG tablet Take 25 mg by mouth daily.   . DESCOVY 200-25 MG tablet TAKE 1 TABLET BY MOUTH DAILY  . fluticasone (FLONASE) 50 MCG/ACT nasal spray Place 2 sprays into both nostrils daily. (Patient taking differently: Place 2 sprays into both nostrils daily as needed for allergies. )  . hydrALAZINE (APRESOLINE) 25 MG tablet Take 1 tablet (25 mg total) by mouth every 4 (four) hours as needed (FOR SYSTOLIC BLOOD PRESSURE ABOVE 160).  [name] loratadine (CLARITIN) 10 MG tablet Take 1 tablet (10 mg total) by mouth daily as needed for allergies.  [name] losartan-hydrochlorothiazide (HYZAAR) 100-25 MG tablet Take 1 tablet by mouth daily. (Patient taking differently: Take 1 tablet by mouth daily as needed (hypertension). )  . Multiple Vitamin (MULTIVITAMIN WITH MINERALS) TABS tablet Take 1 tablet by mouth daily. (Patient taking differently: Take 1 tablet by mouth daily as needed (immune support). )  . NIFEdipine (PROCARDIA XL/ADALAT-CC) 60 MG 24 hr tablet Take 1 tablet (60 mg total) by mouth daily.  [name] POTASSIUM PO Take 1 tablet by mouth daily as needed. With lasix  . Tetrahydrozoline HCl (VISINE OP) Place 2 drops into both eyes as needed (for dry eyes).  [name] TIVICAY 50 MG tablet TAKE 1 TABLET BY MOUTH DAILY     Allergies: Allergies as of 01/05/2017 - Review  Complete 01/05/2017  Allergen Reaction Noted  . Tree extract Swelling and Other (See Comments) 04/13/2016  . Ciprofloxacin Hives 07/09/2015   Past Medical History:  Diagnosis Date  . Anemia   . Anxiety    HX PANIC ATTACKS  . Arthritis    "starting to; in my hands" (07/09/2015)  . Chronic asthma with acute exacerbation    "I have chronic asthma all the time; sometimes exacerbations" (07/09/2015)  . Chronic lower back pain   . Cyst of right kidney    "3 of them; dx'd in ~ 01/2015"  . GERD (gastroesophageal reflux disease)   . Heart murmur   . History of blood transfusion      "related to my brain surgery I think"  . History of pulmonary embolism 07/09/2015  . HIV antibody positive ([place])   . HIV disease ([place])   . Hyperlipidemia   . Hypertension   . Lipodystrophy   . Pneumonia 07/09/2015  . Sleep apnea    "never completed part 2 of study; never wore mask" (07/09/2015)    Family History: Mother is deceased and had CHF, thyroid disease. Father is deceased. Maternal uncle had MI.   Social History: Never smoker. Only has alcohol at special events - rarely. Denies illicits.  Review of Systems: A complete ROS was negative except as per HPI.   Physical Exam: Blood pressure (!) 160/89, pulse 86, resp. rate (!) 22, SpO2 93 %. General Apperance: NAD Head: Normocephalic, atraumatic Eyes: PERRL, EOMI, anicteric sclera Ears: Normal external ear canal Nose: Nares normal, septum midline, mucosa normal Throat: Lips, mucosa and tongue normal  Neck: Supple, trachea midline Back: No tenderness or bony abnormality  Lungs: Poor air movement in bilateral lung fields, very faint wheezes Chest Wall: Nontender, no deformity Heart: Regular rate and rhythm, no murmur/rub/gallop Abdomen: Soft, nontender, nondistended, no rebound/guarding Extremities: Normal, atraumatic, warm and well perfused, no edema Pulses: 2+ throughout Skin: No rashes or lesions Neurologic: Alert and oriented x 3. CNII-XII intact. Normal strength and sensation  I independently reviewed the EKG and CXR.   EKG: normal sinus rhythm, T wave inversion in aVL which is present on previous EKG. Largely unchanged from previous EKG.  CXR: cardiomegaly, no acute interstitial process  CTA Chest: No demonstrable pulmonary embolus. Ascending thoracic aortic diameter measures 4.0 x 4.0 cm. No thoracic aortic dissection. Minimal pericardial effusion. There are foci of coronary artery calcification. There is left ventricular hypertrophy. Scattered areas of mild atelectasis. No edema or consolidation. No evident  adenopathy. Probable multinodular goiter in the thyroid. Nonobstructing calculus in upper pole right kidney.  Assessment & Plan by Problem: 61 year old woman with HIV, HFpEF, asthma, GERD, HLD, HTN, hx PE, anxiety presenting with chest pain.  Chest pain: Hx CAD with nonobstructing disease on angiography in 2013, HFpEF, normal Lexiscan Myoview in 2017. HR in the 70s to 80s. Hypertensive. O2 sat in the mid 80s on room air which increases to 90s on 2-3 L. BNP 41. Troponin POC 0 x 2 in ED. EKG with no acute ischemic changes. No leukocytosis and Hgb normal. CTA chest with no PE or edema. -Echo -Trend trops, repeat EKG in AM -Hgb A1c -UDS  -tramadol '50mg'$  q6hr prn (avoiding NSAIDs given renal disease) -admit to tele -nitrostat SL 0.'4mg'$  Q57mn prn chest pain -received ASA '325mg'$  in ED. Start '81mg'$  daily tomorrow.  COPD exacerbation: Hx asthma but also has obstruction on PFTs. She reports increased sputum purulence and production. Poor airflow on exam today with faint wheezes.  -Solumedrol  $'60mg'I$  x 1, start prednisone '40mg'$  daily x 4 days tomorrow -Duonebs q4hr and albuterol q2hr prn -Augmentin 500-'125mg'$  q8hr for 5 days -Pulmicort BID -Continue home Flonase 2 sprays daily, loratadine '10mg'$  daily -supplemental O2 to maintain O2 sat above 88%  HTN: BP up to 185/112 in the ED. She takes her medications differently from prescribed. Only takes Hyzaar intermittently.  -Coreg '25mg'$  BID -Nifedipine '60mg'$  daily -HCTZ '25mg'$  daily -Losartan '100mg'$  daily held for now -Hydralazine '25mg'$  q6hr prn elevated BP  CKD: Cr 1.19 on presentation. Baseline is around 1.2. UA normal. Nonobstructing (clarified with radiologist as the impression says completely obstructing which differs from the body of the read) calculus in upper pole right kidney. -Continue to monitor  Ascending thoracic aortic dilation: diameter measures 4.0 x 4.0 cm. Recommend annual imaging followup by CTA or MRA.  HIV: continue home [place],  Descovy  FEN: HH VTE ppx: Lovenox Code: FULL  Dispo: Admit patient to Observation with expected length of stay less than 2 midnights.  Signed: Milagros Loll, MD 01/05/2017, 12:10 PM  Pager: [phone]

## 2017-01-06 ENCOUNTER — Observation Stay (HOSPITAL_BASED_OUTPATIENT_CLINIC_OR_DEPARTMENT_OTHER): Payer: Medicare Other

## 2017-01-06 DIAGNOSIS — R072 Precordial pain: Secondary | ICD-10-CM

## 2017-01-06 DIAGNOSIS — Z79899 Other long term (current) drug therapy: Secondary | ICD-10-CM

## 2017-01-06 DIAGNOSIS — J189 Pneumonia, unspecified organism: Secondary | ICD-10-CM | POA: Diagnosis not present

## 2017-01-06 DIAGNOSIS — I2721 Secondary pulmonary arterial hypertension: Secondary | ICD-10-CM | POA: Diagnosis not present

## 2017-01-06 DIAGNOSIS — R0609 Other forms of dyspnea: Secondary | ICD-10-CM

## 2017-01-06 DIAGNOSIS — J45909 Unspecified asthma, uncomplicated: Secondary | ICD-10-CM | POA: Diagnosis not present

## 2017-01-06 DIAGNOSIS — I77810 Thoracic aortic ectasia: Secondary | ICD-10-CM

## 2017-01-06 DIAGNOSIS — Z7982 Long term (current) use of aspirin: Secondary | ICD-10-CM | POA: Diagnosis not present

## 2017-01-06 DIAGNOSIS — R0789 Other chest pain: Secondary | ICD-10-CM

## 2017-01-06 DIAGNOSIS — I5032 Chronic diastolic (congestive) heart failure: Secondary | ICD-10-CM | POA: Diagnosis not present

## 2017-01-06 DIAGNOSIS — R269 Unspecified abnormalities of gait and mobility: Secondary | ICD-10-CM | POA: Diagnosis not present

## 2017-01-06 DIAGNOSIS — G4733 Obstructive sleep apnea (adult) (pediatric): Secondary | ICD-10-CM | POA: Diagnosis not present

## 2017-01-06 DIAGNOSIS — I351 Nonrheumatic aortic (valve) insufficiency: Secondary | ICD-10-CM

## 2017-01-06 DIAGNOSIS — J449 Chronic obstructive pulmonary disease, unspecified: Secondary | ICD-10-CM | POA: Diagnosis not present

## 2017-01-06 DIAGNOSIS — Z881 Allergy status to other antibiotic agents status: Secondary | ICD-10-CM

## 2017-01-06 LAB — BASIC METABOLIC PANEL
ANION GAP: 9 (ref 5–15)
BUN: 16 mg/dL (ref 6–20)
CO2: 28 mmol/L (ref 22–32)
Calcium: 9.7 mg/dL (ref 8.9–10.3)
Chloride: 104 mmol/L (ref 101–111)
Creatinine, Ser: 1.1 mg/dL — ABNORMAL HIGH (ref 0.44–1.00)
GFR calc Af Amer: >60 mL/min (ref >60)
GFR, EST NON AFRICAN AMERICAN: 53 mL/min — AB (ref >60)
GLUCOSE: 142 mg/dL — AB (ref 65–99)
POTASSIUM: 4 mmol/L (ref 3.5–5.1)
Sodium: 141 mmol/L (ref 135–145)

## 2017-01-06 LAB — ECHOCARDIOGRAM COMPLETE
Height: 63.5 in
Weight: 3648 oz

## 2017-01-06 LAB — RAPID URINE DRUG SCREEN, HOSP PERFORMED
AMPHETAMINES: NOT DETECTED
BARBITURATES: NOT DETECTED
BENZODIAZEPINES: NOT DETECTED
COCAINE: NOT DETECTED
Opiates: NOT DETECTED
TETRAHYDROCANNABINOL: NOT DETECTED

## 2017-01-06 LAB — CBC
HEMATOCRIT: 38.7 % (ref 36.0–46.0)
HEMOGLOBIN: 12.2 g/dL (ref 12.0–15.0)
MCH: 30 pg (ref 26.0–34.0)
MCHC: 31.5 g/dL (ref 30.0–36.0)
MCV: 95.1 fL (ref 78.0–100.0)
Platelets: 187 10*3/uL (ref 150–400)
RBC: 4.07 MIL/uL (ref 3.87–5.11)
RDW: 13 % (ref 11.5–15.5)
WBC: 7.8 10*3/uL (ref 4.0–10.5)

## 2017-01-06 MED ORDER — ASPIRIN 81 MG PO CHEW: 81.0000 mg | CHEWABLE_TABLET | Freq: Every day | ORAL | 0 refills | Status: DC

## 2017-01-06 MED ORDER — SODIUM CHLORIDE 0.9 % IV SOLN
INTRAVENOUS | Status: DC
  Administered 2017-01-06: 11:00:00 via INTRAVENOUS
  Filled 2017-01-06: qty 1000

## 2017-01-06 MED ORDER — AMOXICILLIN-POT CLAVULANATE 500-125 MG PO TABS: 1.0000 | ORAL_TABLET | Freq: Three times a day (TID) | ORAL | 0 refills | Status: AC

## 2017-01-06 MED ORDER — POLYVINYL ALCOHOL 1.4 % OP SOLN
1.0000 [drp] | OPHTHALMIC | Status: DC | PRN
  Administered 2017-01-06: 1 [drp] via OPHTHALMIC
  Filled 2017-01-06: qty 15

## 2017-01-06 MED ORDER — PREDNISONE 20 MG PO TABS: 40.0000 mg | ORAL_TABLET | Freq: Every day | ORAL | 0 refills | Status: AC

## 2017-01-06 NOTE — Consult Note (Signed)
Cardiology Consult    Patient ID: Shannon [name] MRN: [id number], DOB/AGE: [birth year]   Admit date: 01/05/2017 Date of Consult: 01/06/2017  Primary Physician: [name], MD Reason for Consult: Chest pain Primary Cardiologist: Dr. Oval Linsey Requesting Provider: Dr. Dareen Piano  History of Present Illness    Shannon [name] is a 61 y.o. female who is being seen today for the evaluation of chest pain at the request of Dr. Dareen Piano. The patient has a past medical history significant for HTN, asthma, non-obstructive CAD, OSA, prior PE, moderate pulmonary hypertension, and HIV (well controllled).   The patient has a long convoluted story. She has been struggling with respiratory issues for the last 4 months attributing this to sinus infection and allergies complicated by a 40 year history of asthma. She has had exertional chest tightness and DOE with walking and climbing 1 flight of stairs. She has been having to walk more as she has gone back to [employer]. She is using her inhaler often with minimal relief. On Monday night she began to experience sharp, electric shocklike chest pains beneath her left breast that shoot to her mid chest, left shoulder and down her left arm. These are intermittent, lasting a few seconds and is ongoing. She feels that this is related to some breast cysts.   She had some shortness of breath while talking to me. She had a sleep study in March and is now using oxygen at night, no CPAP. She feels that she has been getting progressively weaker with loss of muscle mass.   She is followed by cardiology, Dr. Oval Linsey, having been initially referred for evaluation of lower extremity edema and shortness of breath in 2017. She was last seen on 12/01/16. A review of that note indicated that an echocardiogram in 01/2016 revealed LVEF 60-65% with grade 1 DD and hypokinesis of the basal inferior wall. She also has mild aortic stenosis  with a mean gradient of 9 mmHg and  trivial AR.  PASP was 52 mmHg.  She had a Lexiscan Myoview at that time that was negative for ischemia.  Shannon [name] previously had a Lexiscan Cardiolite in September 2013 that revealed a medium, mild reversible defect in the anterior wall and normal systolic function. She subsequently underwent cardiac catheterization in 05/2012 that revealed nonobstructive coronary disease. She had mild pulmonary hypertension and a normal cardiac output. Left ventriculography revealed mild to moderate aortic regurgitation. She had an echo May 2016 that revealed an ejection fraction of 53% and mild diastolic dysfunction. It also revealed calcification of the aortic valve, mild to moderate aortic regurgitation, and mild aortic stenosis. Peak velocity was 2.1 m/s. She had a 24-hour Holter in February 2015 that revealed rare PACs and PVCs.  Past Medical History   Past Medical History:  Diagnosis Date  . Anemia   . Anxiety    HX PANIC ATTACKS  . Arthritis    "starting to; in my hands" (07/09/2015)  . Chronic asthma with acute exacerbation    "I have chronic asthma all the time; sometimes exacerbations" (07/09/2015)  . Chronic lower back pain   . Cyst of right kidney    "3 of them; dx'd in ~ 01/2015"  . GERD (gastroesophageal reflux disease)   . Heart murmur   . History of blood transfusion    "related to my brain surgery I think"  . History of pulmonary embolism 07/09/2015  . HIV antibody positive ([place])   . HIV disease ([place])   .  Hyperlipidemia   . Hypertension   . Lipodystrophy   . Pneumonia 07/09/2015  . Sleep apnea    "never completed part 2 of study; never wore mask" (07/09/2015)    Past Surgical History:  Procedure Laterality Date  . ABDOMINAL HYSTERECTOMY     "robotic laparosopic"  . BRAIN SURGERY  1974   "brain tumor; benign; on top of my brain; got a plate in there"  . CARDIAC CATHETERIZATION    . TONSILLECTOMY AND ADENOIDECTOMY       Allergies  Allergies  Allergen Reactions  . Tree  Extract Swelling and Other (See Comments)    Swelling to eyes  . Ciprofloxacin Hives    Inpatient Medications    . amoxicillin-clavulanate  1 tablet Oral Q8H  . aspirin  81 mg Oral Daily  . budesonide (PULMICORT) nebulizer solution  0.25 mg Nebulization BID  . carvedilol  25 mg Oral BID WC  . dolutegravir  50 mg Oral Daily  . emtricitabine-tenofovir AF  1 tablet Oral Daily  . enoxaparin (LOVENOX) injection  40 mg Subcutaneous Q24H  . fluticasone  2 spray Each Nare Daily  . hydrochlorothiazide  25 mg Oral Daily  . ipratropium-albuterol  3 mL Nebulization Q4H  . loratadine  10 mg Oral Daily  . NIFEdipine  60 mg Oral Daily  . predniSONE  40 mg Oral Q breakfast  . sodium chloride flush  3 mL Intravenous Q12H  . vitamin C  1,000 mg Oral Daily    Family History    Family History  Problem Relation Age of Onset  . Asthma Mother   . Heart failure Mother        cardiomyopathy  . Heart murmur Sister   . Heart murmur Brother   . Diabetes Sister   . Thyroid disease Sister   . Heart murmur Sister      Social History    Social History   Social History  . Marital status: Divorced    Spouse name: N/A  . Number of children: N/A  . Years of education: N/A   Occupational History  . Not on file.   Social History Main Topics  . Smoking status: Never Smoker  . Smokeless tobacco: Never Used  . Alcohol use 0.0 oz/week     Comment: Rarely.  . Drug use: No  . Sexual activity: Not Currently    Partners: Male     Comment: declined condoms   Other Topics Concern  . Not on file   Social History Narrative  . No narrative on file     Review of Systems   General:  No chills, fever, night sweats. C/O weakness and decrease in muscle mass.  Cardiovascular:  Positive for chest pain as above, dyspnea on exertion, edema, 2+ pillow orthopnea, No  palpitations, paroxysmal nocturnal dyspnea. Dermatological: No rash, lesions/masses Respiratory: Positive for cough, dyspnea Urologic: No  hematuria, dysuria Abdominal:   No nausea, vomiting, diarrhea, bright red blood per rectum, melena, or hematemesis Neurologic:  No visual changes, wkns, changes in mental status. All other systems reviewed and are otherwise negative except as noted above.  Physical Exam    Blood pressure 137/78, pulse 66, temperature 97.8 F (36.6 C), temperature source Oral, resp. rate 18, height 5' 3.5" (1.613 m), weight 228 lb (103.4 kg), SpO2 93 %.  General: Pleasant, obese, NAD Psych: Normal affect. Neuro: Alert and oriented X 3. Moves all extremities spontaneously. HEENT: Normal  Neck: Supple without bruits or JVD. Lungs:  Resp regular  and unlabored, CTA. Heart: RRR no s3, s4, 3/6 harsh systolic murmur in RUSB Abdomen: Soft, non-tender, non-distended, BS + x 4.  Extremities: No clubbing, cyanosis or edema. DP/PT/Radials 2+ and equal bilaterally.  Labs    Troponin [facility] Test)  Recent Labs  01/05/17 0849  TROPIPOC 0.00    Recent Labs  01/05/17 1600 01/05/17 2107  TROPONINI <0.03 <0.03   Lab Results  Component Value Date   WBC 7.8 01/06/2017   HGB 12.2 01/06/2017   HCT 38.7 01/06/2017   MCV 95.1 01/06/2017   PLT 187 01/06/2017    Recent Labs Lab 01/06/17 0537  NA 141  K 4.0  CL 104  CO2 28  BUN 16  CREATININE 1.10*  CALCIUM 9.7  GLUCOSE 142*   Lab Results  Component Value Date   CHOL 217 (H) 11/26/2016   HDL 67 11/26/2016   LDLCALC 132 (H) 11/26/2016   TRIG 88 11/26/2016   No results found for: [facility]   Radiology Studies    Dg Chest 2 View  Result Date: 01/05/2017 CLINICAL DATA:  Chest pain.  Wheezing and shortness of breath. EXAM: CHEST  2 VIEW COMPARISON:  Radiographs 11/24/2016, multiple priors. FINDINGS: Multiple overlying monitoring devices. Stable cardiomegaly. Unchanged mediastinal contours with tortuous thoracic aorta. Linear scarring in the lingula in right middle lobe, unchanged from prior. No pulmonary edema, consolidation, pleural fluid or  pneumothorax. No acute osseous abnormalities. IMPRESSION: Stable cardiomegaly and bibasilar scarring.  No acute abnormality. Electronically Signed   By: Jeb Levering M.D.   On: 01/05/2017 06:27   Ct Angio Chest Pe W Or Wo Contrast  Addendum Date: 01/05/2017   ADDENDUM REPORT: 01/05/2017 13:13 ADDENDUM: Sentence in the Impression should read: Nonobstructing calculus upper pole right kidney. Electronically Signed   By: Lowella Grip III M.D.   On: 01/05/2017 13:13   Result Date: 01/05/2017 CLINICAL DATA:  Shortness of breath and chest pain EXAM: CT ANGIOGRAPHY CHEST WITH CONTRAST TECHNIQUE: Multidetector CT imaging of the chest was performed using the standard protocol during bolus administration of intravenous contrast. Multiplanar CT image reconstructions and MIPs were obtained to evaluate the vascular anatomy. CONTRAST:  100 mL Isovue 370 nonionic COMPARISON:  Chest CT October 30, 2015 and chest radiograph Jan 05, 2017 FINDINGS: Cardiovascular: There is no demonstrable pulmonary embolus. Ascending thoracic aortic diameter is 4.0 x 4.0 cm. There is no thoracic aortic dissection. There is mild calcification at the origin of the left common carotid artery. There are scattered foci of coronary artery calcification. There is left ventricular hypertrophy. There is a rather minimal pericardial effusion inferiorly. Mediastinum/Nodes: Thyroid appears somewhat inhomogeneous with a left lobe larger than the right lobe. A well-defined dominant mass in the thyroid is not evident by CT. There is no appreciable thoracic adenopathy the. Lungs/Pleura: There is mild atelectatic change in the right middle lobe and lingular regions. There is no frank edema or consolidation. No pleural effusion or pleural thickening evident. Upper Abdomen: There is a 5 x 4 mm calculus in the upper pole of the right kidney, nonobstructing. Visualized upper abdominal structures otherwise appear unremarkable. Musculoskeletal: There is  degenerative change in the thoracic spine. There are no blastic or lytic bone lesions. Review of the MIP images confirms the above findings. IMPRESSION: No demonstrable pulmonary embolus. Ascending thoracic aortic diameter measures 4.0 x 4.0 cm. Recommend annual imaging followup by CTA or MRA. This recommendation follows 2010 ACCF/AHA/AATS/ACR/ASA/SCA/SCAI/SIR/STS/SVM Guidelines for the Diagnosis and Management of Patients with Thoracic Aortic Disease. Circulation.  2010; 121: P619-J093. No thoracic aortic dissection. Minimal pericardial effusion. There are foci of coronary artery calcification. There is left ventricular hypertrophy. Scattered areas of mild atelectasis. No edema or consolidation. No evident adenopathy. Probable multinodular goiter in the thyroid. Completely obstructing calculus upper pole right kidney. Electronically Signed: By: Lowella Grip III M.D. On: 01/05/2017 11:30    EKG & Cardiac Imaging    EKG: Sinus rhythm 98 bpm, abnormal r wave progression. No cute ischemic changes.   Echocardiogram: pending  Last echo 01/29/2016 Study Conclusions  - Left ventricle: The cavity size was normal. Wall thickness was   increased in a pattern of moderate LVH. Systolic function was   normal. The estimated ejection fraction was in the range of 60%   to 65%. Basal inferior hypokinesis. Doppler parameters are   consistent with abnormal left ventricular relaxation (grade 1   diastolic dysfunction). - Aortic valve: Trileaflet; moderately calcified leaflets. There   was very mild stenosis. There was mild regurgitation. Mean   gradient (S): 9 mm Hg. Valve area (VTI): 1.68 cm^2. - Mitral valve: There was trivial regurgitation. - Left atrium: The atrium was mildly dilated. - Right ventricle: The cavity size was normal. Systolic function   was normal. - Tricuspid valve: Peak RV-RA gradient (S): 44 mm Hg. - Pulmonary arteries: PA peak pressure: 52 mm Hg (S). - Systemic veins: IVC measured 2.3  cm with > 50% respirophasic   variation, suggesting RA pressure 8 mmHg.  Impressions:  - Normal LV size with moderate LV hypertrophy. EF 60-65%. Basal   inferior hypokinesis. Normal RV size and systolic function. Very   mild aortic stenosis, mild aortic insufficieny. Moderate   pulmonary hypertension.  Transthoracic echocardiography.  M-mode, complete 2D, spectral Doppler, and color Doppler.  Birthdate:  Patient birthdate: 1955-10-26.  Age:  Patient is 61 yr old.  Sex:  Gender: female. BMI: 38.9 kg/m^2.  Blood pressure:     188/98  Patient status: Outpatient.  Study date:  Study date: 01/29/2016. Study time: 12:52 PM.  -------------------------------------------------------------------  ------------------------------------------------------------------- Left ventricle:  The cavity size was normal. Wall thickness was increased in a pattern of moderate LVH. Systolic function was normal. The estimated ejection fraction was in the range of 60% to 65%. Basal inferior hypokinesis. Doppler parameters are consistent with abnormal left ventricular relaxation (grade 1 diastolic dysfunction).  Assessment & Plan    Chest pain -Mixed typical and atypical features with current complaints of sharp, electric shocklike intermittent pains lasting a few seconds, but also has had exertional chest tightness and DOE and dizziness for months that could be related to pulmonary hypertension and her increase in activity, going back to community college. -Cardiac cath in 2013 showed non-obstructive CAD. She had normal Lexiscan perfusion study on 01/31/2016 with normal EF. Normal Lexiscan myoview 01/31/2016. -Last echo in 01/2016 shoed normal LV function with EF 60-65% and basal inferior hypokinesis, grade 1 DD. -Troponins negative X 4.  BNP 41.6. -Electrolytes normal, SCr mildly elevated at 1.19. Hgb 12.7 -EKG without acute ischemic changes -CXR showed stable cardiomegaly, no acute abnormality -CTA of the  chest showed no pulmonary embolism. It shows ascending thoracic aorta 4.0 X 4.0 cm, recommendation for annual imaging. Also shows minimal preicardial effusion and foci of coronary artery calcification and left ventricular hypertrophy. No edema or consolidation. Completely obstructing calculus upper pole of right kidney. -Check echo -Would not advise any further cardiac workup at present with recent testing.   CKD -SCr 1.19, baseline 1.2.  -Per xray and  interpretation by IM, pt has non-obstructuing calculus in the right upper pole right kidney -Followed by IM  Hypertension -Managed at home with carvedilol 12.5 mg bid, hydralazine 25 mg q 4h prn SBP >160, Hyzaar 100-25 mg daily (pt only takes as needed), Nifedipine 60 mg daily -Inpatient therapy- carvedilol 25 mg bid, hydrochlorothiazide 25 mg daily, nifedipine 60 mg daily -Blood pressure was elevated on admission, now improved  HIV -Continuing current therapy  Tildon Husky, NP-C 01/06/2017, 9:49 AM Pager: [phone]  I have seen and examined the patient along with Daune Perch, NP-C.  I have reviewed the chart, notes and new data.  I agree with NP's note.  Key new complaints: chest pain is clearly non cardiac - she can reproduce it by pushing on the nodules she feels in her left breast; she also has NYHA class 2 exertional dyspnea, independent of chest pain. Key examination changes: morbid obesity limits her exam, but appears to have a normal CV exam Key new findings / data: ECG normal, normal enzymes. CT chest reviewed.  PLAN: Her dyspnea is related to pulmonary HTN, in turn due to obesity/OSA, but also possible diastolic HF. She does not appear to be overtly hypervolemic on exam. Will review echo when completed. She has had a thorough recent workup for CAD and I do not think any additional cardiac workup will be required on this admission.  Sanda Klein, MD, [place] [phone] 01/06/2017, 11:58  AM

## 2017-01-06 NOTE — Discharge Instructions (Signed)
Chest Wall Pain Chest wall pain is pain in or around the bones and muscles of your chest. Sometimes, an injury causes this pain. Sometimes, the cause may not be known. This pain may take several weeks or longer to get better. Follow these instructions at home: Pay attention to any changes in your symptoms. Take these actions to help with your pain:  Rest as told by your health care provider.  Avoid activities that cause pain. These include any activities that use your chest muscles or your abdominal and side muscles to lift heavy items.  If directed, apply ice to the painful area:  Put ice in a plastic bag.  Place a towel between your skin and the bag.  Leave the ice on for 20 minutes, 2-3 times per day.  Take over-the-counter and prescription medicines only as told by your health care provider.  Do not use tobacco products, including cigarettes, chewing tobacco, and e-cigarettes. If you need help quitting, ask your health care provider.  Keep all follow-up visits as told by your health care provider. This is important. Contact a health care provider if:  You have a fever.  Your chest pain becomes worse.  You have new symptoms. Get help right away if:  You have nausea or vomiting.  You feel sweaty or light-headed.  You have a cough with phlegm (sputum) or you cough up blood.  You develop shortness of breath. This information is not intended to replace advice given to you by your health care provider. Make sure you discuss any questions you have with your health care provider. Document Released: 08/10/2005 Document Revised: 12/19/2015 Document Reviewed: 11/05/2014 Elsevier Interactive Patient Education  2017 Elsevier Inc[employer]   Nonspecific Chest Pain Chest pain can be caused by many different conditions. There is always a chance that your pain could be related to something serious, such as a heart attack or a blood clot in your lungs. Chest pain can also be caused by  conditions that are not life-threatening. If you have chest pain, it is very important to follow up with your health care provider. What are the causes? Causes of this condition include:  Heartburn.  Pneumonia or bronchitis.  Anxiety or stress.  Inflammation around your heart (pericarditis) or lung (pleuritis or pleurisy).  A blood clot in your lung.  A collapsed lung (pneumothorax). This can develop suddenly on its own (spontaneous pneumothorax) or from trauma to the chest.  Shingles infection (varicella-zoster virus).  Heart attack.  Damage to the bones, muscles, and cartilage that make up your chest wall. This can include:  Bruised bones due to injury.  Strained muscles or cartilage due to frequent or repeated coughing or overwork.  Fracture to one or more ribs.  Sore cartilage due to inflammation (costochondritis). What increases the risk? Risk factors for this condition may include:  Activities that increase your risk for trauma or injury to your chest.  Respiratory infections or conditions that cause frequent coughing.  Medical conditions or overeating that can cause heartburn.  Heart disease or family history of heart disease.  Conditions or health behaviors that increase your risk of developing a blood clot.  Having had chicken pox (varicella zoster). What are the signs or symptoms? Chest pain can feel like:  Burning or tingling on the surface of your chest or deep in your chest.  Crushing, pressure, aching, or squeezing pain.  Dull or sharp pain that is worse when you move, cough, or take a deep breath.  Pain that  is also felt in your back, neck, shoulder, or arm, or pain that spreads to any of these areas. Your chest pain may come and go, or it may stay constant. How is this diagnosed? Lab tests or other studies may be needed to find the cause of your pain. Your health care provider may have you take a test called an ECG (electrocardiogram). An ECG  records your heartbeat patterns at the time the test is performed. You may also have other tests, such as:  Transthoracic echocardiogram (TTE). In this test, sound waves are used to create a picture of the heart structures and to look at how blood flows through your heart.  Transesophageal echocardiogram (TEE).This is a more advanced imaging test that takes images from inside your body. It allows your health care provider to see your heart in finer detail.  Cardiac monitoring. This allows your health care provider to monitor your heart rate and rhythm in real time.  Holter monitor. This is a portable device that records your heartbeat and can help to diagnose abnormal heartbeats. It allows your health care provider to track your heart activity for several days, if needed.  Stress tests. These can be done through exercise or by taking medicine that makes your heart beat more quickly.  Blood tests.  Other imaging tests. How is this treated? Treatment depends on what is causing your chest pain. Treatment may include:  Medicines. These may include:  Acid blockers for heartburn.  Anti-inflammatory medicine.  Pain medicine for inflammatory conditions.  Antibiotic medicine, if an infection is present.  Medicines to dissolve blood clots.  Medicines to treat coronary artery disease (CAD).  Supportive care for conditions that do not require medicines. This may include:  Resting.  Applying heat or cold packs to injured areas.  Limiting activities until pain decreases. Follow these instructions at home: Medicines   If you were prescribed an antibiotic, take it as told by your health care provider. Do not stop taking the antibiotic even if you start to feel better.  Take over-the-counter and prescription medicines only as told by your health care provider. Lifestyle   Do not use any products that contain nicotine or tobacco, such as cigarettes and e-cigarettes. If you need help  quitting, ask your health care provider.  Do not drink alcohol.  Make lifestyle changes as directed by your health care provider. These may include:  Getting regular exercise. Ask your health care provider to suggest some activities that are safe for you.  Eating a heart-healthy diet. A registered dietitian can help you to learn healthy eating options.  Maintaining a healthy weight.  Managing diabetes, if necessary.  Reducing stress, such as with yoga or relaxation techniques. General instructions   Avoid any activities that bring on chest pain.  If heartburn is the cause for your chest pain, raise (elevate) the head of your bed about 6 inches (15 cm) by putting blocks under the legs. Sleeping with more pillows does not effectively relieve heartburn because it only changes the position of your head.  Keep all follow-up visits as told by your health care provider. This is important. This includes any further testing if your chest pain does not go away. Contact a health care provider if:  Your chest pain does not go away.  You have a rash with blisters on your chest.  You have a fever.  You have chills. Get help right away if:  Your chest pain is worse.  You have a cough  that gets worse, or you cough up blood.  You have severe pain in your abdomen.  You have severe weakness.  You faint.  You have sudden, unexplained chest discomfort.  You have sudden, unexplained discomfort in your arms, back, neck, or jaw.  You have shortness of breath at any time.  You suddenly start to sweat, or your skin gets clammy.  You feel nauseous or you vomit.  You suddenly feel light-headed or dizzy.  Your heart begins to beat quickly, or it feels like it is skipping beats. These symptoms may represent a serious problem that is an emergency. Do not wait to see if the symptoms will go away. Get medical help right away. Call your local emergency services (911 in the [place] Do not drive  yourself to the hospital. This information is not intended to replace advice given to you by your health care provider. Make sure you discuss any questions you have with your health care provider. Document Released: 05/20/2005 Document Revised: 05/04/2016 Document Reviewed: 05/04/2016 Elsevier Interactive Patient Education  2017 [employer].

## 2017-01-06 NOTE — Progress Notes (Signed)
   Subjective: Seen and evaluated at bedside. CP improved. Feels like CP is secondary to "cysts under he breasts" instead of her heart. She continues to perseverate on needing an anti-viral for her persistent upper respiratory tract symptoms for which she's received several courses of antibiotics. She states these dont work because they are anti-bacterial and she needs anti-viral.   Objective:  Vital signs in last 24 hours: Vitals:   01/06/17 0600 01/06/17 0631 01/06/17 0700 01/06/17 1140  BP: (!) 147/91 (!) 162/95 137/78 137/78  Pulse: 78 76 66 73  Resp:   18 20  Temp:   97.8 F (36.6 C)   TempSrc:   Oral   SpO2: 93% 92% 93% 92%  Weight:      Height:       General: In no acute distress. Resting comfortably in bed.  HENT:No conjunctival injection, icterus or ptosis. Oropharynx clear, mucous membranes moist.  Cardiovascular: Regular rate and rhythm. No murmur or rub appreciated. Pulmonary: CTA BL, no wheezing, crackles or rhonchi appreciated. Unlabored breathing.  Abdomen: Soft, non-tender and non-distended. No guarding or rigidity. +bowel sounds.  Skin: Warm, dry. No cyanosis.  Psych: Mood normal and affect was mood congruent. Responds to questions appropriately.    Assessment/Plan:  Active Problems:   Chest pain  Chest Wall Pain: Trop neg x4. EKG negative. BNP normal. Last echo 01/2016 with grade 1 diastolic dysfunction. Normal cath in 2013 with a normal myoview 01/2016. CXR clear. CTA without PE or consolidation. Seen and evaluated by cardiology and we greatly appreciate their evaluation. They agree that her CP is non-cardiac andnote her normal thorough work-up for CAD. Do not recommend any further cardiac work-up during this admission. She reports today that she feels her chest pain is due to "cysts under her breasts." Denied any in her groin or under her armpits. Reviewed CT images, do not appreciate any nodules or masses. She also had a normal mammogram 03/2016.  -ECHO with grade 1  diastolic dysfunction.  -FU cysts in outpatient -FU with her established cardiologist at her already scheduled appointment next week  Pulmonary Hypertension, COPD Exacerbation, Asthma: Complains of having a respiratory tract infection for several months which has not improved with "antibacterials."  Frustrated over having been given Augmentin as she feels this is viral in nature and requests an antiviral medication. She describes SOB, DOE, malaise and a cough productive of green mucous. Perseverates about her viral infection, recent shingles and her long history with HIV disease. She follows with pulmonology as an outpatient and recently saw them 09/2016.  -FU with her established pulmonologist at her already scheduled appointment tomorrow. -Augmentin x5 days -Pulmicort BID -Flonase and loratadine  -Prednisone to complete 5 day course  HIV: Well-controlled. States she takes her medicines however is able to control her HIV. She notes her most recent CD4 was 700+ and her viral load was "negative 21." Despite this, she feels its the "opportunistic infections" that get her.  -FU with ID  Dispo: Anticipated discharge today.   Darcia Lampi, DO 01/06/2017, 1:16 PM Pager: [phone]

## 2017-01-06 NOTE — Progress Notes (Signed)
Pt has refused her neb treatments this morning stating she is too dehydrated.  RT asked pt to contact RN if she feels she needs a treatment later.  Duoneb is scheduled again at 1200. BS are CTA.

## 2017-01-06 NOTE — Progress Notes (Signed)
  Echocardiogram 2D Echocardiogram has been performed.  Deryck Hippler L Androw 01/06/2017, 10:55 AM

## 2017-01-06 NOTE — Care Management Note (Signed)
Case Management Note  Patient Details  Name: Shannon [name] MRN: [id number] Date of Birth: November 24, 1955  Subjective/Objective:   Chest pain, Pulm HTN,                  Action/Plan: Discharge Planning: NCM spoke to pt and lives at home alone. Has oxygen and RW at home. Requesting tub bench. Contacted [employer] rep for DME. Provided pt with [place] Medicaid PCS application to give to her PCP to complete. Pt may qualify for aide to come daily to assist with ADL's.     PCP Gilles Chiquito B MD   Expected Discharge Date:  01/06/17               Expected Discharge Plan:  Home/Self Care  In-House Referral:  NA  Discharge planning Services  CM Consult  Post Acute Care Choice:  NA Choice offered to:  NA  DME Arranged:  Tub bench DME Agency:  [place]:  NA [place] Agency:  NA  Status of Service:  Completed, signed off  If discussed at [place] of Stay Meetings, dates discussed:    Additional Comments:  Erenest Rasher, RN 01/06/2017, 4:41 PM

## 2017-01-06 NOTE — Progress Notes (Signed)
  Date: 01/06/2017  Patient name: Shannon [name]  Medical record number: [id number]  Date of birth: 03/10/1956   I have seen and evaluated [name] and discussed their care with the Residency Team. In brief, patient is 61 year old female with past medical history of HIV, chronic diastolic heart failure, asthma, GERD, hyperlipidemia, hypertension who presented to the ED with chest pain 2 days.  Patient states that approximately 2 days ago she developed chest pain which was midsternal, nonradiating, associated with diaphoresis and nausea, sharp in nature and intermittent. Patient states that over the last 2 days her chest pain has become more constant. She also notes that her pain is relieved by nitroglycerin. Patient also complains of associated respiratory infection which has been present over the last 2-3 months and has not resolved despite antibiotic treatment as an outpatient. She complained of associated fevers and chills at home but has been afebrile here. She also complains of a cough productive of greenish phlegm. No shortness of breath, no palpitations, no lightheadedness, no syncope, no focal weakness, no headaches, no abdominal pain, no nausea or vomiting.  Today patient still complains of intermittent midsternal chest pain but states that has improved slightly.   PMHx, Fam Hx, and/or Soc Hx :  As per resident admit note  Vitals:   01/06/17 1140 01/06/17 1516  BP: 137/78 (!) 159/74  Pulse: 73 84  Resp: 20 18  Temp:  98.4 F (36.9 C)   Gen.: Awake alert and oriented 3, NAD CVS: Regular rate and rhythm, normal heart sounds Lungs: CTA bilaterally Abdomen: Soft, obese, nontender, normoactive bowel sounds Extremities: No edema noted   Assessment and Plan: I have seen and evaluated the patient as outlined above. I agree with the formulated Assessment and Plan as detailed in the residents' note, with the following changes:   1.  Chest pain: - Patient is 61 year old female  who presented to the ED with intermittent chest pain over the last 2 days. Chest pain is atypical in nature but is relieved with nitroglycerin. Patient has had a normal stress test in 2017. Cardiac enzymes are negative 3 sets. EKG with no acute ST-T wave changes. - Cardiology follow-up and recommendations appreciated - 2-D echo results noted. No focal wall motion abnormalities and a normal EF - No further workup for now - Patient is stable for discharge home today with outpatient follow-up with PCP and cardiology  2. Acute COPD exacerbation: - Patient presented to the ED with increased cough with sputum production and received Solu-Medrol 60 mg 1 in the ED - We will continue with prednisone 40 mg for 4 more days - We will complete a 5 day course of Augmentin for likely COPD exacerbation - Likely discharge home today  Aldine Contes, MD 5/16/20183:33 PM

## 2017-01-06 NOTE — Care Management Obs Status (Signed)
Hurt NOTIFICATION   Patient Details  Name: Shannon [name] MRN: [id number] Date of Birth: Aug 02, 1956   Medicare Observation Status Notification Given:  Yes Explained notice/declined to sign.    Erenest Rasher, RN 01/06/2017, 4:37 PM

## 2017-01-07 ENCOUNTER — Ambulatory Visit: Payer: Medicare Other | Admitting: Pulmonary Disease

## 2017-01-07 DIAGNOSIS — I77810 Thoracic aortic ectasia: Secondary | ICD-10-CM

## 2017-01-07 LAB — HEMOGLOBIN A1C
Hgb A1c MFr Bld: 5.9 % — ABNORMAL HIGH (ref 4.8–5.6)
MEAN PLASMA GLUCOSE: 123 mg/dL

## 2017-01-07 NOTE — Discharge Summary (Signed)
Name: Shannon [name] MRN: [id number] DOB: 1956-05-20 61 y.o. PCP: [name], MD  Date of Admission: 01/05/2017  5:48 AM Date of Discharge: 01/06/2017 Attending Physician: Dr. Orie Fisherman, MD  Discharge Diagnosis: 1. Atypical Chest pain 2. Dyspnea on Exertion 3. Dilated ascending thoracic aorta  Active Problems:   Dyspnea   Chest pain   Ascending aorta dilatation [facility])  Discharge Medications: Allergies as of 01/06/2017      Reactions   Tree Extract Swelling, Other (See Comments)   Swelling to eyes   Ciprofloxacin Hives      Medication List    STOP taking these medications   hydrALAZINE 25 MG tablet Commonly known as:  APRESOLINE     TAKE these medications   acetaminophen 500 MG tablet Commonly known as:  TYLENOL Take 500 mg by mouth every 6 (six) hours as needed for mild pain.   albuterol (2.5 MG/3ML) 0.083% nebulizer solution Commonly known as:  PROVENTIL Take 3 mLs (2.5 mg total) by nebulization every 6 (six) hours as needed for wheezing or shortness of breath.   albuterol 108 (90 Base) MCG/ACT inhaler Commonly known as:  PROAIR HFA Inhale 2 puffs into the lungs every 4 (four) hours as needed for wheezing or shortness of breath.   amoxicillin-clavulanate 500-125 MG tablet Commonly known as:  AUGMENTIN Take 1 tablet (500 mg total) by mouth every 8 (eight) hours.   aspirin 81 MG chewable tablet Chew 1 tablet (81 mg total) by mouth daily.   carvedilol 12.5 MG tablet Commonly known as:  COREG Take 25 mg by mouth daily.   citalopram 10 MG tablet Commonly known as:  CELEXA Take 1 tablet (10 mg total) by mouth daily.   cyanocobalamin 1000 MCG/ML injection Commonly known as:  (VITAMIN B-12) Inject 1 mL (1,000 mcg total) into the muscle every 30 (thirty) days.   DESCOVY 200-25 MG tablet Generic drug:  emtricitabine-tenofovir AF TAKE 1 TABLET BY MOUTH DAILY   fluticasone 50 MCG/ACT nasal spray Commonly known as:  FLONASE Place 2 sprays into both  nostrils daily. What changed:  when to take this  reasons to take this   loratadine 10 MG tablet Commonly known as:  CLARITIN Take 1 tablet (10 mg total) by mouth daily as needed for allergies.   losartan-hydrochlorothiazide 100-25 MG tablet Commonly known as:  HYZAAR Take 1 tablet by mouth daily. What changed:  when to take this  reasons to take this   multivitamin with minerals Tabs tablet Take 1 tablet by mouth daily. What changed:  when to take this  reasons to take this   NIFEdipine 60 MG 24 hr tablet Commonly known as:  PROCARDIA XL/ADALAT-CC Take 1 tablet (60 mg total) by mouth daily.   POTASSIUM PO Take 1 tablet by mouth daily as needed. With lasix   predniSONE 20 MG tablet Commonly known as:  DELTASONE Take 2 tablets (40 mg total) by mouth daily with breakfast.   SYRINGE/NEEDLE (DISP) 1 ML 25G X 5/8" 1 ML Misc Commonly known as:  B-D SYRINGE/NEEDLE 1CC/25GX5/8 1 Units by Does not apply route every 30 (thirty) days.   TIVICAY 50 MG tablet Generic drug:  dolutegravir TAKE 1 TABLET BY MOUTH DAILY   VISINE OP Place 2 drops into both eyes as needed (for dry eyes).   vitamin C 1000 MG tablet Take 1,000 mg by mouth daily.       Disposition and follow-up:   Shannon [name] was discharged from [facility]  in stable condition.  At the hospital follow up visit please address:  1.  Chest pain/breast cysts: Pt reported after admission that she feels her pain is due to "cysts" under her breasts. CT images were reviewed which did not show abscess in the reported area. Please consider further evaluation.  Dilated ascending thoracic aorta: 4cmx4cm on CTA. Will need yearly FU.  DOE: She complains of long history of DOE which has not changed over time. She has multiple chronic pulmonary diseases which clearly explain her presentation including pulmonary hypertension, COPD, asthma and likely OSA. She will require continued counseling regarding the  long-term nature of her conditions. She had follow-up appointment with her pulmonologist the day following discharge.  "Viral Illness" She reports a 4 month history of a non-specific unidentifiable viral illness for which she continued to request medications for. She perseverated this during admission however she was without a clear source of an infectious process.   2.  Labs / imaging needed at time of follow-up: none.  3.  Pending labs/ test needing follow-up: none.  Follow-up Appointments: Follow-up Information    Campbell Riches, MD. Schedule an appointment as soon as possible for a visit in 3 week(s).   Specialty:  Infectious Diseases Contact information: [place] [address] [phone]        [name], MD. Schedule an appointment as soon as possible for a visit in 3 week(s).   Specialty:  Internal Medicine Contact information: [place] [address] [phone]        Skeet Latch, MD Follow up in 1 week(s).   Specialty:  Cardiology Contact information: [address] [place] [address] [phone]          Hospital Course by problem list: Active Problems:   Chest pain   1. Atypical Chest Pain Shannon [name] is a 61 y/o M with extensive medical history who presented 01/05/17 for evaluation of chest pain. She presented with a long and convoluted presentation. Developed sharp mid sternal, non-radiating chest pain 2 days PTA associated with nausea. This was improved with nitro. She attributed this chest pain to "cysts" under her breasts. EKG was without acute changes and troponins were negative x4. Telemetry was also without arrhythmia. She was seen by cardiology who also felt her chest pain was not cardiac in etiology and suggested close follow-up with her cardiologist, PCP and pulmonologist.   2. Dyspnea on Exertion secondary to Pulmonary Hypertension, COPD, Asthma She also complains of a 3 month  history of respiratory issues which she attributes to various upper respiratory tract infections for which she has been on several courses of antibiotics. She perseverated over being prescribed "antibacterials" when what she needs is an "antiviral" as she feels this is some sort of viral infection. She has clear explanations of her DOE with her chronic lung conditions. She had follow up with her pulmonologist the day following discharge.   3. Dilated Ascending Thoracic Aorta Noted on CTA, measures 4x4cm. Will require annual follow-up.   Discharge Vitals:   BP 137/78   Pulse 76   Temp 98.4 F (36.9 C) (Oral)   Resp 18   Ht 5' 3.5" (1.613 m)   Wt 228 lb (103.4 kg)   SpO2 92%   BMI 39.75 kg/m   Pertinent Labs, Studies, and Procedures:  Troponins negative x4 EKG without acute changes CXR without acute process CTA: Without pulmonary embolism. 4x4cm dilation of ascending thoracic aorta  Discharge Instructions: Shannon [name], you were admitted because you told the emergency room you had chest pain. You did not have a heart attack. You believe this pain is secondary to your cysts under your breasts. Please talk with your primary care physician regarding these cysts so that they can be worked-up in the outpatient setting.  Also, you mentioned having a several month history of having an unknown viral infection. Please discuss this with your infectious disease specialist at your upcoming appointment. You did not show any evidence of viral infection during admission.   SignedEinar Gip, DO 01/07/2017, 2:31 PM   Pager: [phone]

## 2017-01-08 NOTE — Addendum Note (Signed)
Addended by: Hulan Fray on: 01/08/2017 07:24 AM   Modules accepted: Orders

## 2017-01-10 DIAGNOSIS — G4733 Obstructive sleep apnea (adult) (pediatric): Secondary | ICD-10-CM | POA: Diagnosis not present

## 2017-01-10 DIAGNOSIS — J189 Pneumonia, unspecified organism: Secondary | ICD-10-CM | POA: Diagnosis not present

## 2017-01-11 DIAGNOSIS — J449 Chronic obstructive pulmonary disease, unspecified: Secondary | ICD-10-CM | POA: Diagnosis not present

## 2017-01-13 ENCOUNTER — Encounter: Payer: Self-pay | Admitting: Internal Medicine

## 2017-01-13 ENCOUNTER — Ambulatory Visit (INDEPENDENT_AMBULATORY_CARE_PROVIDER_SITE_OTHER): Payer: Medicare Other | Admitting: Internal Medicine

## 2017-01-13 ENCOUNTER — Ambulatory Visit: Payer: Medicare Other | Admitting: Pulmonary Disease

## 2017-01-13 ENCOUNTER — Ambulatory Visit: Payer: Medicare Other | Admitting: Internal Medicine

## 2017-01-13 VITALS — BP 148/84 | HR 96 | Ht 63.0 in | Wt 229.0 lb

## 2017-01-13 DIAGNOSIS — R6881 Early satiety: Secondary | ICD-10-CM | POA: Diagnosis not present

## 2017-01-13 DIAGNOSIS — F418 Other specified anxiety disorders: Secondary | ICD-10-CM

## 2017-01-13 DIAGNOSIS — Z6841 Body Mass Index (BMI) 40.0 and over, adult: Secondary | ICD-10-CM

## 2017-01-13 DIAGNOSIS — E669 Obesity, unspecified: Secondary | ICD-10-CM | POA: Insufficient documentation

## 2017-01-13 DIAGNOSIS — R194 Change in bowel habit: Secondary | ICD-10-CM | POA: Diagnosis not present

## 2017-01-13 DIAGNOSIS — Z9981 Dependence on supplemental oxygen: Secondary | ICD-10-CM | POA: Diagnosis not present

## 2017-01-13 DIAGNOSIS — R1013 Epigastric pain: Secondary | ICD-10-CM

## 2017-01-13 DIAGNOSIS — R140 Abdominal distension (gaseous): Secondary | ICD-10-CM | POA: Diagnosis not present

## 2017-01-13 MED ORDER — OMEPRAZOLE 40 MG PO CPDR: 40.0000 mg | DELAYED_RELEASE_CAPSULE | Freq: Every day | ORAL | 11 refills | Status: DC

## 2017-01-13 NOTE — Patient Instructions (Addendum)
  We have sent the following medications to your pharmacy for you to pick up at your convenience: Omeprazole   It has been recommended to you by your physician that you have a(n) EGD/colonoscopy completed. Per your request, we did not schedule the procedure(s) today. Please contact our office at [phone] after checking your schedule and ask for Barb Merino, RN or PJ, CMA as we will have to put orders into the system and get you a pre-visit date.  Patient in school at [employer] and will aim for a August hospital date.    I appreciate the opportunity to care for you. Silvano Rusk, MD, [facility]

## 2017-01-13 NOTE — Progress Notes (Signed)
Shannon [name] 61 y.o. 1955-11-26 [id number]  Assessment & Plan:   Encounter Diagnoses  Name Primary?  . Abdominal pain, epigastric Yes  . Early satiety   . Morbid obesity with BMI of 40.0-44.9, adult ([place])   . Change in bowel habits   . Bloating   . Anxiety about health   . On home oxygen therapy    She needs evaluation with an EGD and colonoscopy. Highly likely to have a functional GI syndrome but need to exclude other problems. GERD may be part of problem. Anxiety overlay a bif part of this. Reassured as best I can today. Prednisone Tx also increasing anxiety and mood right now.  Treat GERD w/ omeprazole 40 mg qd. May help asthma  Agree w/ dietitian eval and weight loss and exercise pulmonary rehab Bariatric surgery might be most effective but co-morbidities and anxiety might be barriers - not broached today  She is on low-dose citalopram so room to go there vs using duloxetine which would help functional GI sxs better also and will also keep buspirone Tx in mind pending results of endoscopic evaluation   The risks and benefits as well as alternatives of endoscopic procedure(s) have been discussed and reviewed. All questions answered. The patient agrees to proceed. Will do procedures at hospital - her lung condition and use of home O2 increase sedation risks so need better safety net   She should get new shingles vaccine when able  Review of vaccines - do not see PNA vaccines - ? Had not asked by me today  I appreciate the opportunity to care for this patient. XA:JOINOM, Peri Jefferson, MD [name], MD Dr. Corrie Dandy  Subjective:   Chief Complaint: abdominal bloating, pain and breathing problems  HPI 61 yo AA woman here by herself - she has been having upper abdominal pain/discomfort w/ pressure that is associated with dyspnea. Says I know my body and something is fifferent and my doctors are not listening to me. Wants to lose weight - has lipodystrophy and says that is  changing some - hx steroids many times over yrs for asthma. Having some difficulty with defecation - less frequent and straining and smaller stools though this is variable. She relates that to medication changes.  She says when she eats there is intense epigastric pressure w/ bloating and early satiety and then dyspnea. Also having heartburn. Said she was on omeprazole 10 mg qdd but not on list so she is now uncertain. No dysphagia, does not vomit. No bleeding at all.  Has seen a gastroenterologist in [facility] or [place] but not here and no endoscopy ever.  Current Meds  Medication Sig  . acetaminophen (TYLENOL) 500 MG tablet Take 500 mg by mouth every 6 (six) hours as needed for mild pain.   [name] albuterol (PROAIR HFA) 108 (90 Base) MCG/ACT inhaler Inhale 2 puffs into the lungs every 4 (four) hours as needed for wheezing or shortness of breath.  [name] albuterol (PROVENTIL) (2.5 MG/3ML) 0.083% nebulizer solution Take 3 mLs (2.5 mg total) by nebulization every 6 (six) hours as needed for wheezing or shortness of breath.  [name] amoxicillin-clavulanate (AUGMENTIN) 500-125 MG tablet Take 1 tablet by mouth every 8 (eight) hours.  . Ascorbic Acid (VITAMIN C) 1000 MG tablet Take 1,000 mg by mouth daily.  . carvedilol (COREG) 12.5 MG tablet Take 25 mg by mouth daily.   . citalopram (CELEXA) 10 MG tablet Take 10 mg by mouth daily.  . cyanocobalamin (,VITAMIN B-12,) 1000  MCG/ML injection Inject 1 mL (1,000 mcg total) into the muscle every 30 (thirty) days.  . DESCOVY 200-25 MG tablet TAKE 1 TABLET BY MOUTH DAILY  . fluticasone (FLONASE) 50 MCG/ACT nasal spray Place 2 sprays into both nostrils daily. (Patient taking differently: Place 2 sprays into both nostrils daily as needed for allergies. )  . loratadine (CLARITIN) 10 MG tablet Take 1 tablet (10 mg total) by mouth daily as needed for allergies.  [name] losartan-hydrochlorothiazide (HYZAAR) 100-25 MG tablet Take 1 tablet by mouth daily. (Patient taking differently: Take 1 tablet  by mouth daily as needed (hypertension). )  . Multiple Vitamin (MULTIVITAMIN WITH MINERALS) TABS tablet Take 1 tablet by mouth daily. (Patient taking differently: Take 1 tablet by mouth daily as needed (immune support). )  . NIFEdipine (PROCARDIA XL/ADALAT-CC) 60 MG 24 hr tablet Take 1 tablet (60 mg total) by mouth daily.  . OXYGEN Inhale into the lungs. 2.5 to 3 liters at night and as needed in the daytime.  [name] POTASSIUM PO Take 1 tablet by mouth daily as needed. With lasix  . predniSONE (DELTASONE) 20 MG tablet Take 20 mg by mouth daily with breakfast.  . SYRINGE/NEEDLE, DISP, 1 ML (B-D SYRINGE/NEEDLE 1CC/25GX5/8) 25G X 5/8" 1 ML MISC 1 Units by Does not apply route every 30 (thirty) days.  . Tetrahydrozoline HCl (VISINE OP) Place 2 drops into both eyes as needed (for dry eyes).  [name] TIVICAY 50 MG tablet TAKE 1 TABLET BY MOUTH DAILY  . [DISCONTINUED] aspirin 81 MG chewable tablet Chew 1 tablet (81 mg total) by mouth daily.   Allergies  Allergen Reactions  . Tree Extract Swelling and Other (See Comments)    Swelling to eyes  . Ciprofloxacin Hives   Past Medical History:  Diagnosis Date  . Anemia   . Anxiety    HX PANIC ATTACKS  . Arthritis    "starting to; in my hands" (07/09/2015)  . Chronic asthma with acute exacerbation    "I have chronic asthma all the time; sometimes exacerbations" (07/09/2015)  . Chronic lower back pain   . Cyst of right kidney    "3 of them; dx'd in ~ 01/2015"  . GERD (gastroesophageal reflux disease)   . Heart murmur   . History of blood transfusion    "related to my brain surgery I think"  . History of pulmonary embolism 07/09/2015  . HIV antibody positive ([place])   . HIV disease ([place])   . Hyperlipidemia   . Hypertension   . Lipodystrophy   . Pneumonia 07/09/2015  . Shingles   . Sleep apnea    "never completed part 2 of study; never wore mask" (07/09/2015)   Past Surgical History:  Procedure Laterality Date  . ABDOMINAL HYSTERECTOMY     "robotic  laparosopic"  . BRAIN SURGERY  1974   "brain tumor; benign; on top of my brain; got a plate in there"  . CARDIAC CATHETERIZATION    . TONSILLECTOMY AND ADENOIDECTOMY     Social History   Social History  . Marital status: Divorced    Spouse name: N/A  . Number of children: N/A  . Years of education: N/A   Occupational History  . student    Social History Main Topics  . Smoking status: Never Smoker  . Smokeless tobacco: Never Used  . Alcohol use 0.0 oz/week     Comment: Rarely.  . Drug use: No  . Sexual activity: Not Currently    Partners: Male  Comment: declined condoms   Other Topics Concern  . None   Social History Narrative   Originally from [place] then [place] now [place] 2016 approx   Disabled but going to [place] [id number]   Updated 01/13/2017      family history includes Asthma in her mother; Diabetes in her sister; Heart failure in her mother; Heart murmur in her brother, sister, and sister; Thyroid disease in her sister.   Review of Systems Recent admit for BP elevation and chest pain Recovering from shingles left thigh Not sleeping well - chronic and worse with prednisone Tx All other ROS negative or HPI  Objective:   Physical Exam @BP  (!) 148/84 (BP Location: Left Arm, Patient Position: Sitting, Cuff Size: Large)   Pulse 96   Ht 5\' 3"  (1.6 m)   Wt 229 lb (103.9 kg)   BMI 40.57 kg/m @  General:  Well-developed, well-nourished and in no acute distress - obese Eyes:  anicteric. ENT:   Mouth and posterior pharynx free of lesions.  Neck:   supple w/o thyromegaly or mass.  Lungs: Clear to auscultation bilaterally. Heart:  S1S2, no rubs, murmurs, gallops. Abdomen: Obese  soft,  mildlytender, no hepatosplenomegaly, hernia, or mass and BS+.  Rectal: deferred Lymph:  no cervical or supraclavicular adenopathy. Extremities:   no edema, cyanosis or clubbing Skin   no rash. Neuro:  A&O x 3.  Psych:  appropriate mood and  Affect.   Data  Reviewed: Hospital notes, labs, PCP, cardiology notes, ID notes 2018 CT chest (no hiatal hernia) Renal US _ CT renal - cysts kidneys- CXR - images viewed

## 2017-01-14 ENCOUNTER — Ambulatory Visit (INDEPENDENT_AMBULATORY_CARE_PROVIDER_SITE_OTHER): Payer: Medicare Other | Admitting: Pulmonary Disease

## 2017-01-14 ENCOUNTER — Encounter: Payer: Self-pay | Admitting: Pulmonary Disease

## 2017-01-14 DIAGNOSIS — G4734 Idiopathic sleep related nonobstructive alveolar hypoventilation: Secondary | ICD-10-CM | POA: Diagnosis not present

## 2017-01-14 DIAGNOSIS — J4540 Moderate persistent asthma, uncomplicated: Secondary | ICD-10-CM | POA: Diagnosis not present

## 2017-01-14 DIAGNOSIS — G4710 Hypersomnia, unspecified: Secondary | ICD-10-CM

## 2017-01-14 DIAGNOSIS — K219 Gastro-esophageal reflux disease without esophagitis: Secondary | ICD-10-CM

## 2017-01-14 MED ORDER — ALBUTEROL SULFATE HFA 108 (90 BASE) MCG/ACT IN AERS: 2.0000 | INHALATION_SPRAY | RESPIRATORY_TRACT | 3 refills | Status: DC | PRN

## 2017-01-14 MED ORDER — ALBUTEROL SULFATE (2.5 MG/3ML) 0.083% IN NEBU: 2.5000 mg | INHALATION_SOLUTION | Freq: Four times a day (QID) | RESPIRATORY_TRACT | 3 refills | Status: DC | PRN

## 2017-01-14 NOTE — Assessment & Plan Note (Signed)
Non-apneic Nocturnal hypoxemia diagnosed by sleep study 05/11/16 improved with 1L supplemental O2 via [place]. Likely secondary to underlying Asthma/ COPD She however uses 2L O2 AT hs.

## 2017-01-14 NOTE — Assessment & Plan Note (Addendum)
Patient is a nonsmoker. Dxed with asthma when she was in her 85s. Triggers: extreme change in weather. Her asthma has beed unstable . She moved from [place] to [place] in Oct 2016.  PFT (12/04/15)  FEV1/FVC 50%,  FEV1  0.64  31%. Some BD response. DLCO 66%. Pt stopped symbicort since she felt her heart was racing. She states she is better with alb prn. She also does not like prednisone.  Spiriva did not help much. She has issues with MDIs.   Over all, she feels better over all with just alb prn.  Uses alb neb or MDI 1-2x/week. Asthma has been stable.   She had recent SOB likely related to stress + shingles + elevated BP. Chest CT scan in May 2018 did not reveal any filling defects, no  pneumonia.  Plan : 1. Cont alb neb or MDI q4hrs prn. She did not like Pulmicort. Told her to give [place] a call if she ends up using albuterol more frequently. May need to restart Pulmicort nebulizer. 2. She is happy with her asthma. Recent mild flare 2/2 bronchitis and CHFpEF exacaerbation. S/P prednisone and abx.  3. Does not want vaccines.

## 2017-01-14 NOTE — Patient Instructions (Signed)
It was a pleasure taking care of you today!  You are diagnosed with Asthma.   Asthma is a chronic disease that affects the airways of your lungs.When you have asthma, your airways become swollen. The swelling causes the airways to make thick, sticky secretions called mucus.   Asthma also causes the muscles in and around your airways to get very tight.  This swelling, mucus, and tight muscles can make your airways narrower than normal and it becomes very hard for you to get air into and out of your lungs.   Sometimes, when you have a lung infection, this can make your breathing worse, and will cause you to  have an asthma flare-up. Please call your primary care doctor or the office if you are having an asthma flare-up.   Smoking makes asthma worse.   Rescue medications: Albuterol 2 puffs every 4 hours as needed for shortness of breath. May also use albuterol nebulizer as needed for dyspnea.   Please rinse your mouth each time you use your maintenance medication.   Please call the office if you are having issues with your medications  Patient wants to follow up with Dr. Elsworth Soho >> in 2-3 months

## 2017-01-14 NOTE — Assessment & Plan Note (Signed)
Patient was diagnosed with obstructive sleep apnea more than 10 years ago. She has hypersomnia, snoring, gasping, choking, fatigue. She had a sleep study done in [place]. She never had a CPAP  Study done. Her hypersomnia has gotten worse. Hypersomnia affects her functionality. -- Sleep study in 04/2016 was (-) for OSA. Needed o2 1L. Still with some hypersomnia but improved and not bothering her much.  -- Advised on good sleep hygiene. -- Advised not to engage in activities requiring vigilance if she is sleepy.

## 2017-01-14 NOTE — Progress Notes (Signed)
Subjective:    Patient ID: Shannon [name], female    DOB: 1955-12-10, 61 y.o.   MRN: [id number]  HPI Pt is being seen for asthma, OSA, exertional dyspnea.   DATA PFT (12/04/15)  FEV1/FVC 50%,  FEV1  0.64  31%. Some BD response. DLCO 66%.   ROV (12/04/15) Pt returns to office after being seen in 09/2015 for hypersomnia, asthma. Has been sick with "PNA" since last seen. Stopped symbicort 2/2 heart racing.  Still with cough and congestion. She feels better with proair > uses it 3x/week. Also has anxiety attacks. Has not been admitted since last seen. HIV meds have been changed. Took bactrim and augmentin for cough/congestion.   ROV (01/02/16) Pt returns to clinic as f/u on her OSA, asthma.   Since last seen, she states she remains SOB and hoarse. Uses spiriva. She thinks she retains fluid. Saw heart MD. No recent admission. Received zpak in 11/2015. Issues using MDIs.   ROV 07/09/16 Patient follows up in the office for several issues. Since last seen, she had a sleep lab study done 04/2016 which was negative for sleep apnea but proved she needed 1 L of oxygen at bedtime. She was also seen by Tammy couple months ago. She mentioned the Pulmicort did not necessarily make a difference. She only uses albuterol when necessary and that has helped her with her asthma. She tries to avoid triggers. Recent cough related to weather change. Has not been admitted nor been on antibiotics since last seen. Feels better overall.  ROV 10/01/2016 Patient returns to the office as follow-up on her SOB and asthma. Since last seen, she was admitted last month for congestive heart failure exacerbation. She was placed on Lasix and she is improved. She continues to use her oxygen at bedtime. Uses alb less frequently.   ROV 01/14/2017 Patient returns to the office as f/u on her cough and SOB.  Pt has been in and out of ED 2/2 to elevated BP + SOB + cp. Her BP is better now (relatively), on a lot of BP meds. She had shingles recently.   She was given augmentin for bronchitis. She finished prenisone as well. She has gained weight with prednisone. She had a chest CT scan in May 2018 which did not show any pulmonary embolism, no pneumonia.   Review of Systems  Constitutional: Negative.   HENT: Positive for congestion and postnasal drip.   Eyes: Negative.   Respiratory: Positive for cough and shortness of breath.   Cardiovascular: Negative.   Gastrointestinal: Negative.   Endocrine: Negative.   Genitourinary: Negative.   Musculoskeletal: Negative.   Allergic/Immunologic: Negative.   Neurological: Negative.   Hematological: Negative.   Psychiatric/Behavioral: Negative.   All other systems reviewed and are negative.      Objective:   Physical Exam Vitals:  Vitals:   01/14/17 1156  BP: (!) 160/118  Pulse: 93  SpO2: 92%  Weight: 230 lb (104.3 kg)  Height: 5\' 3"  (1.6 m)    Constitutional/General:  Pleasant, well-nourished, well-developed, not in any distress,  Comfortably seating.  Well kempt  Body mass index is 40.74 kg/m. Wt Readings from Last 3 Encounters:  01/14/17 230 lb (104.3 kg)  01/13/17 229 lb (103.9 kg)  01/06/17 228 lb (103.4 kg)      HEENT: Pupils equal and reactive to light and accommodation. Anicteric sclerae. Normal nasal mucosa.   No oral  lesions,  mouth clear,  oropharynx clear, no postnasal drip. (-) Oral thrush. No dental  caries.  Airway - Mallampati class IV  Neck: No masses. Midline trachea. No JVD, (-) LAD. (-) bruits appreciated.  Respiratory/Chest: Grossly normal chest. (-) deformity. (-) Accessory muscle use.  Symmetric expansion. (-) Tenderness on palpation.  Resonant on percussion.  Diminished BS on both lower lung zones. (-)crackles, rhonchi. (-) wheezing.  (-) egophony  Cardiovascular: Regular rate and  rhythm, heart sounds normal, no murmur or gallops,(+) peripheral edema  Gastrointestinal:  Normal bowel sounds. Soft, non-tender. No hepatosplenomegaly.  (-)  masses.   Musculoskeletal:  Normal muscle tone. Normal gait.   Extremities: Grossly normal. (-) clubbing, cyanosis.  (+)  edema  Skin: (-) rash,lesions seen.   Neurological/Psychiatric : alert, oriented to time, place, person. Normal mood and affect             Assessment & Plan:  Asthma, moderate persistent Patient is a nonsmoker. Dxed with asthma when she was in her 60s. Triggers: extreme change in weather. Her asthma has beed unstable . She moved from [place] to [place] in Oct 2016.  PFT (12/04/15)  FEV1/FVC 50%,  FEV1  0.64  31%. Some BD response. DLCO 66%. Pt stopped symbicort since she felt her heart was racing. She states she is better with alb prn. She also does not like prednisone.  Spiriva did not help much. She has issues with MDIs.   Over all, she feels better over all with just alb prn.  Uses alb neb or MDI 1-2x/week. Asthma has been stable.   She had recent SOB likely related to stress + shingles + elevated BP. Chest CT scan in May 2018 did not reveal any filling defects, no  pneumonia.  Plan : 1. Cont alb neb or MDI q4hrs prn. She did not like Pulmicort. Told her to give [place] a call if she ends up using albuterol more frequently. May need to restart Pulmicort nebulizer. 2. She is happy with her asthma. Recent mild flare 2/2 bronchitis and CHFpEF exacaerbation. S/P prednisone and abx.  3. Does not want vaccines.   Hypersomnia Patient was diagnosed with obstructive sleep apnea more than 10 years ago. She has hypersomnia, snoring, gasping, choking, fatigue. She had a sleep study done in [place]. She never had a CPAP  Study done. Her hypersomnia has gotten worse. Hypersomnia affects her functionality. -- Sleep study in 04/2016 was (-) for OSA. Needed o2 1L. Still with some hypersomnia but improved and not bothering her much.  -- Advised on good sleep hygiene. -- Advised not to engage in activities requiring vigilance if she is sleepy.       Morbid obesity  ([place]) Weight reduction   GERD (gastroesophageal reflux disease) PPI at HS.   Nocturnal hypoxemia Non-apneic Nocturnal hypoxemia diagnosed by sleep study 05/11/16 improved with 1L supplemental O2 via [place]. Likely secondary to underlying Asthma/ COPD She however uses 2L O2 AT hs.       Return to clinic in 6 months. She wants to follow-up with Dr. Elsworth Soho.  Monica Becton, MD 01/14/2017, 4:56 PM [facility] Pager [phone] After 3 pm or if no answer, call [phone]

## 2017-01-14 NOTE — Assessment & Plan Note (Signed)
PPI at HS.

## 2017-01-14 NOTE — Assessment & Plan Note (Signed)
Weight reduction 

## 2017-01-15 ENCOUNTER — Ambulatory Visit: Payer: Medicare Other | Admitting: Cardiovascular Disease

## 2017-01-15 NOTE — Progress Notes (Deleted)
Cardiology Office Note   Date:  01/15/2017   ID:  Shannon [name], DOB 1955/08/26, MRN [id number]  PCP:  [name], MD  Cardiologist:   Skeet Latch, MD  ID: [name], MD  No chief complaint on file.    History of Present Illness: Shannon [name] is a 61 y.o. female with hypertension, asthma, non-obstructive CAD, OSA, prior PE, moderate pulmonary hypertension, and HIV (well-controlled)  who presents for follow up.  Shannon [name] was initially referred by her infectious disease doctor, [name], MD, on 12/26/15. [name] was concerned about her lower extremity edema and shortness of breath.  She was started on lasix 20 mg every other day.  Shannon [name] noted shortness of breath since her hospitalization for pneumonia 06/2015.   She denied lower extremity edema or orthopnea but did report chest pain when laying down at night.  She was referred for an echo 01/2016 that revealed LVEF 60-65% awith grade 1 diastolic dysfunction and hypokinesis of Shannon basal inferior wall.  She also had very mild aortic stenosis with a mean gradient of 9 mmHg and trivial AR.  PASP was 52 mmHg.  She had a Lexiscan Myoview at that time that was negative for ischemia.  Shannon [name] previously had a Lexiscan Cardiolite in September 2013 that revealed a medium, mild reversible defect in Shannon anterior wall and normal systolic function. She subsequently underwent cardiac catheterization that revealed nonobstructive coronary disease. She had mild pulmonary hypertension and a normal cardiac output. Left ventriculography revealed mild to moderate aortic regurgitation. She had an echo May 2016 that revealed an ejection fraction of 40% and mild diastolic dysfunction. It also revealed calcification of Shannon aortic valve, mild to moderate aortic regurgitation, and mild aortic stenosis. Peak velocity was 2.1 m/s. She had a 24-hour Holter in February 2015 that revealed rare PACs and PVCs.  Shannon [name] has struggled with blood pressure  control.  [facility] due to dyspnea.  No infectious source was found and she was not volume overloaded at Shannon time. She was noted to have mild hypoxia at rest. It was felt to be due to a viral upper respiratory infection and a possible flare of At her last appointment Shannon [name] blood pressure was poorly-controlled. Furosemide was discontinued  and she was started on hydrochlorothiazide 25 mg daily. Since then she continues to have episodes of very high BP.  It was 215/110 on 11/24/16 and she called EMS.  They reportedly found a BP of 168/104.   She reportedly had not been taking her medications at present as prescribed. Her blood pressure was similarly high when she saw her PCP.  No changes were made at that time. She reports that she has been taking carvedilol 12.5 mg instead of 25 mg. However she isn't taking all other medicines as prescribed. She continues to have shortness of breath that she attributes to her lipodystrophy and central adiposity. She continues to study at [facility] and struggles with anxiety.   Since her last appointment Shannon [name] was [facility] 12/2016 with chest pain. Cardiac enzymes were negative 4. EKG did not reveal any ischemic changes. Chest pain was felt to be noncardiac and reproducible with palpation. She had an echocardiogram that revealed LVEF 60-65% with mild LVH and a mean aortic valve gradient of 8 mmHg.  Tricuspid regurgitation was not significant enough to assess pulmonary pressures.  BNP was not elevated.  She also had a CTA of  Shannon chest that hospitalization that was negative for pulmonary embolism and revealed a mild ascending aortic aneurysm (4.0 cm).  She was also treated for bronchitis with Augmentin and prednisone.   Past Medical History:  Diagnosis Date  . Anemia   . Anxiety    HX PANIC ATTACKS  . Arthritis    "starting to; in my hands" (07/09/2015)  . Chronic asthma with acute exacerbation    "I have chronic asthma all  Shannon time; sometimes exacerbations" (07/09/2015)  . Chronic lower back pain   . Cyst of right kidney    "3 of them; dx'd in ~ 01/2015"  . GERD (gastroesophageal reflux disease)   . Heart murmur   . History of blood transfusion    "related to my brain surgery I think"  . History of pulmonary embolism 07/09/2015  . HIV antibody positive ([place])   . HIV disease ([place])   . Hyperlipidemia   . Hypertension   . Lipodystrophy   . Pneumonia 07/09/2015  . Shingles   . Sleep apnea    "never completed part 2 of study; never wore mask" (07/09/2015)    Past Surgical History:  Procedure Laterality Date  . ABDOMINAL HYSTERECTOMY     "robotic laparosopic"  . BRAIN SURGERY  1974   "brain tumor; benign; on top of my brain; got a plate in there"  . CARDIAC CATHETERIZATION    . TONSILLECTOMY AND ADENOIDECTOMY       Current Outpatient Prescriptions  Medication Sig Dispense Refill  . acetaminophen (TYLENOL) 500 MG tablet Take 500 mg by mouth every 6 (six) hours as needed for mild pain.     [name] albuterol (PROAIR HFA) 108 (90 Base) MCG/ACT inhaler Inhale 2 puffs into Shannon lungs every 4 (four) hours as needed for wheezing or shortness of breath. 6.7 g 3  . albuterol (PROVENTIL) (2.5 MG/3ML) 0.083% nebulizer solution Take 3 mLs (2.5 mg total) by nebulization every 6 (six) hours as needed for wheezing or shortness of breath. 75 mL 3  . Ascorbic Acid (VITAMIN C) 1000 MG tablet Take 1,000 mg by mouth daily.    . carvedilol (COREG) 12.5 MG tablet Take 25 mg by mouth daily.     . citalopram (CELEXA) 10 MG tablet Take 10 mg by mouth daily.    . cyanocobalamin (,VITAMIN B-12,) 1000 MCG/ML injection Inject 1 mL (1,000 mcg total) into Shannon muscle every 30 (thirty) days. 1 mL 0  . DESCOVY 200-25 MG tablet TAKE 1 TABLET BY MOUTH DAILY 30 tablet 5  . fluticasone (FLONASE) 50 MCG/ACT nasal spray Place 2 sprays into both nostrils daily. ([name] taking differently: Place 2 sprays into both nostrils daily as needed for  allergies. ) 16 g 5  . loratadine (CLARITIN) 10 MG tablet Take 1 tablet (10 mg total) by mouth daily as needed for allergies. 30 tablet 2  . losartan-hydrochlorothiazide (HYZAAR) 100-25 MG tablet Take 1 tablet by mouth daily. ([name] taking differently: Take 1 tablet by mouth daily as needed (hypertension). ) 30 tablet 5  . Multiple Vitamin (MULTIVITAMIN WITH MINERALS) TABS tablet Take 1 tablet by mouth daily. ([name] taking differently: Take 1 tablet by mouth daily as needed (immune support). ) 90 tablet   . NIFEdipine (PROCARDIA XL/ADALAT-CC) 60 MG 24 hr tablet Take 1 tablet (60 mg total) by mouth daily. 30 tablet 11  . omeprazole (PRILOSEC) 40 MG capsule Take 1 capsule (40 mg total) by mouth daily before breakfast. 30 capsule 11  . OXYGEN Inhale into Shannon lungs.  2.5 to 3 liters at night and as needed in Shannon daytime.    [name] POTASSIUM PO Take 1 tablet by mouth daily as needed. With lasix    . SYRINGE/NEEDLE, DISP, 1 ML (B-D SYRINGE/NEEDLE 1CC/25GX5/8) 25G X 5/8" 1 ML MISC 1 Units by Does not apply route every 30 (thirty) days. 50 each 0  . Tetrahydrozoline HCl (VISINE OP) Place 2 drops into both eyes as needed (for dry eyes).    [name] TIVICAY 50 MG tablet TAKE 1 TABLET BY MOUTH DAILY 30 tablet 5   No current facility-administered medications for this visit.     Allergies:   Tree extract and Ciprofloxacin    Social History:  Shannon [name]  reports that she has never smoked. She has never used smokeless tobacco. She reports that she drinks alcohol. She reports that she does not use drugs.   Family History:  Shannon [name]'s family history includes Asthma in her mother; Diabetes in her sister; Heart failure in her mother; Heart murmur in her brother, sister, and sister; Thyroid disease in her sister.    ROS:  Please see Shannon history of present illness.   Otherwise, review of systems are positive for dry mouth.   All other systems are reviewed and negative.   PHYSICAL EXAM: VS:  There were no vitals  taken for this visit. , BMI There is no height or weight on file to calculate BMI. GENERAL:  Well appearing HEENT:  Pupils equal round and reactive, fundi not visualized, oral mucosa unremarkable NECK:  No jugular venous distention, waveform within normal limits, carotid upstroke brisk and symmetric, no bruits LYMPHATICS:  No cervical adenopathy LUNGS:  Clear to auscultation bilaterally HEART:  RRR.  PMI not displaced or sustained,S1 and S2 within normal limits, no S3, no S4, no clicks, no rubs, II/VI early-peaking systolic murmur at Shannon right upper sternal border. I/IV diastolic murmur at Shannon left upper sternal border.  ABD:  Central adiposity. Positive bowel sounds normal in frequency in pitch, no bruits, no rebound, no guarding, no midline pulsatile mass, no hepatomegaly, no splenomegaly EXT:  2 plus pulses throughout, no edema, no cyanosis no clubbing SKIN:  No rashes no nodules NEURO:  Cranial nerves II through XII grossly intact, motor grossly intact throughout PSYCH:  Cognitively intact, oriented to person place and time   EKG:  EKG is not ordered today. Shannon ekg ordered 12/31/15 shows sinus rhythm. Rate 89 bpm. 2 PVCs. 10/28/16: Sinus rhythm. Rate 93 bpm  Coronary angiography 06/01/12:10-20% OM, diffuse 20-30% LAD, 10-20% D1, 20% diffuse RCA RA 10, RV 39/12, RV EDP 12, capillary wedge pressure 19, PA 39/21, mean 27, LV 116/14, LVEDP 20, aorta 115/77 Cardiac output (Fick) 6.55, cardiac index 3.2 PVR 1.7 with units, SVR 12 with units. She P/QRS ratio 1 LVEF 80%. Aortic root mildly dilated. 1-2+ aortic regurgitation.  24-hour Holter 09/26/13: Sinus rhythm, rare PACs, rare PVCs  Echo 01/22/15: LVEF 70%. Mild LVH. Mild diastolic dysfunction. Normal RV function. Left atrium moderately enlarged. Right atrium mildly enlarged. Trace mitral regurgitation. Calcific aortic valve. Mild to moderate aortic regurgitation. Mild aortic stenosis. Peak velocity 2.1 m/s trace tricuspid regurgitation     Lexiscan Myoview 01/2016:   Shannon left ventricular ejection fraction is normal (55-65%). Shannon EF is 60% visually. Shannon computer generated EF is not calculated correctly and is therefore not reported.  There was no ST segment deviation noted during stress.  Shannon study is normal. no ischemia . no infarction   Echo 01/29/16: Study Conclusions  -  Left ventricle: Shannon cavity size was normal. Wall thickness was   increased in a pattern of moderate LVH. Systolic function was   normal. Shannon estimated ejection fraction was in Shannon range of 60%   to 65%. Basal inferior hypokinesis. Doppler parameters are   consistent with abnormal left ventricular relaxation (grade 1   diastolic dysfunction). - Aortic valve: Trileaflet; moderately calcified leaflets. There   was very mild stenosis. There was mild regurgitation. Mean   gradient (S): 9 mm Hg. Valve area (VTI): 1.68 cm^2. - Mitral valve: There was trivial regurgitation. - Left atrium: Shannon atrium was mildly dilated. - Right ventricle: Shannon cavity size was normal. Systolic function   was normal. - Tricuspid valve: Peak RV-RA gradient (S): 44 mm Hg. - Pulmonary arteries: PA peak pressure: 52 mm Hg (S). - Systemic veins: IVC measured 2.3 cm with > 50% respirophasic   variation, suggesting RA pressure 8 mmHg.  Impressions:  - Normal LV size with moderate LV hypertrophy. EF 60-65%. Basal   inferior hypokinesis. Normal RV size and systolic function. Very   mild aortic stenosis, mild aortic insufficieny. Moderate   pulmonary hypertension.   Recent Labs: 03/31/2016: ALT 15 01/05/2017: B Natriuretic Peptide 41.6 01/06/2017: BUN 16; Creatinine, Ser 1.10; Hemoglobin 12.2; Platelets 187; Potassium 4.0; Sodium 141    Lipid Panel    Component Value Date/Time   CHOL 217 (H) 11/26/2016 1618   TRIG 88 11/26/2016 1618   HDL 67 11/26/2016 1618   CHOLHDL 3.2 11/26/2016 1618   VLDL 18 11/26/2016 1618   LDLCALC 132 (H) 11/26/2016 1618      Wt Readings  from Last 3 Encounters:  01/14/17 104.3 kg (230 lb)  01/13/17 103.9 kg (229 lb)  01/06/17 103.4 kg (228 lb)      ASSESSMENT AND PLAN:  # Hypertension:  BP is well-controlled both initially and on repeat.  We discussed Shannon importance of taking her medications as prescribed. We will also give her prescription for hydralazine 25 mg to be taken as needed for systolic blood pressures greater than 160. She expressed understanding. She had a renal ultrasound that showed cysts but there was no atrophy, making significant renal artery stenosis unlikely.  She is already scheduled for an MRI, so we will not get renal artery Dopplers at this time.   # Aortic regurgitation # Aortic stenosis: Shannon [name] has very mild aortic stenosis (mean gradient 9 mmHg) and mild aortic regurgitation.  Continue to monitor.    Current medicines are reviewed at length with Shannon [name] today.  Shannon [name] does not have concerns regarding medicines.  Shannon following changes have been made:  Start hydralazine 25 mg q4h as needed.   Time spent: 30 minutes-Greater than 50% of this time was spent in counseling, explanation of diagnosis, planning of further management, and coordination of care.  Labs/ tests ordered today include:   No orders of Shannon defined types were placed in this encounter.    Disposition:   FU with Karleen Seebeck C. Oval Linsey, MD, [facility] in 4 months   This note was written with Shannon assistance of speech recognition software.  Please excuse any transcriptional errors.  Signed, Juri Dinning C. Oval Linsey, MD, [facility]  01/15/2017 7:23 AM    [place]

## 2017-01-20 ENCOUNTER — Encounter: Payer: Self-pay | Admitting: Internal Medicine

## 2017-01-20 ENCOUNTER — Ambulatory Visit (INDEPENDENT_AMBULATORY_CARE_PROVIDER_SITE_OTHER): Payer: Medicare Other | Admitting: Internal Medicine

## 2017-01-20 VITALS — BP 151/95 | HR 90 | Temp 98.5°F | Ht 63.0 in | Wt 230.0 lb

## 2017-01-20 DIAGNOSIS — I77810 Thoracic aortic ectasia: Secondary | ICD-10-CM | POA: Diagnosis not present

## 2017-01-20 DIAGNOSIS — B20 Human immunodeficiency virus [HIV] disease: Secondary | ICD-10-CM

## 2017-01-20 DIAGNOSIS — R300 Dysuria: Secondary | ICD-10-CM | POA: Diagnosis not present

## 2017-01-20 DIAGNOSIS — Q6102 Congenital multiple renal cysts: Secondary | ICD-10-CM

## 2017-01-20 DIAGNOSIS — E538 Deficiency of other specified B group vitamins: Secondary | ICD-10-CM

## 2017-01-20 DIAGNOSIS — J4540 Moderate persistent asthma, uncomplicated: Secondary | ICD-10-CM

## 2017-01-20 DIAGNOSIS — I10 Essential (primary) hypertension: Secondary | ICD-10-CM

## 2017-01-20 DIAGNOSIS — G4734 Idiopathic sleep related nonobstructive alveolar hypoventilation: Secondary | ICD-10-CM

## 2017-01-20 DIAGNOSIS — K219 Gastro-esophageal reflux disease without esophagitis: Secondary | ICD-10-CM

## 2017-01-20 DIAGNOSIS — F411 Generalized anxiety disorder: Secondary | ICD-10-CM

## 2017-01-20 DIAGNOSIS — Z6841 Body Mass Index (BMI) 40.0 and over, adult: Secondary | ICD-10-CM

## 2017-01-20 DIAGNOSIS — Z79899 Other long term (current) drug therapy: Secondary | ICD-10-CM | POA: Diagnosis not present

## 2017-01-20 DIAGNOSIS — Z21 Asymptomatic human immunodeficiency virus [HIV] infection status: Secondary | ICD-10-CM

## 2017-01-20 DIAGNOSIS — N281 Cyst of kidney, acquired: Secondary | ICD-10-CM

## 2017-01-20 MED ORDER — CYANOCOBALAMIN 1000 MCG/ML IJ SOLN: 1000.0000 ug | INTRAMUSCULAR | 6 refills | Status: DC

## 2017-01-20 MED ORDER — FLUCONAZOLE 150 MG PO TABS: ORAL_TABLET | ORAL | 0 refills | Status: DC

## 2017-01-20 MED ORDER — "SYRINGE/NEEDLE (DISP) 25G X 5/8"" 1 ML MISC": 1.0000 [IU] | 0 refills | Status: DC

## 2017-01-20 NOTE — Assessment & Plan Note (Signed)
Continue injectable therapy which she takes at home.  She feels this helps her immune symptoms  Plan Continue monthly injections Check Vitamin B 12 level at next visit.

## 2017-01-20 NOTE — Assessment & Plan Note (Signed)
She sees pulmonary.  They have recommended pulmonary rehab which she is looking forward to completing.  She has been averse to starting new medications, particularly long acting control medications.  She feels that she has had bad allergies since moving here and feels loratadine is helping.  She continues to have mucus and chest congestion.   Plan I suggested she try OTC mucinex to see if this would help break up the mucus If no better in 1-2 weeks, she will call.

## 2017-01-20 NOTE — Assessment & Plan Note (Signed)
It was not clear, based on my discussion with her, that she has started taking the higher dose of omeprazole.  I reminded her of this recommendation from [name].   Plan Continue omeprazole 40mg  daily.

## 2017-01-20 NOTE — Assessment & Plan Note (Signed)
She is not taking her BP medications as prescribed.  She feels like her elevated BP is a symptom of a larger problem, possibly her weight.  She feels she should be focusing on other therapy to relieve her HTN and not taking more medications.  She did not take her BP medications today.  She reported on May 15 that she was taking her losartan-hctz only PRN.  I think she may have that medication confused with her hydralazine, which was reportedly ordered by Dr. Oval Linsey and appears she did get in April and May.  We did not get a chance to discuss this in depth, and she did not bring in her medications for me to reconcile today.   Plan Continue losartan-hctz, nifedipine, coreg (she is on two sinus blocking agents, pulse is 90 today), hydralazine PRN Continue to follow with Cardiology

## 2017-01-20 NOTE — Patient Instructions (Addendum)
Shannon [name] - -   Thank you for coming in today.   For your possible yeast infection, I will check your urine today and let you know the results.   Please try fluconazole once and then again in 72 hours to see if this helps.   For your blood pressure, please take your medications as prescribed.    We will get you an appointment to see our nutritionist soon.   Thank you!  Please come back to see me in 1-2 months.   Fluconazole tablets What is this medicine? FLUCONAZOLE (floo KON na zole) is an antifungal medicine. It is used to treat certain kinds of fungal or yeast infections. This medicine may be used for other purposes; ask your health care provider or pharmacist if you have questions. COMMON BRAND NAME(S): Diflucan What should I tell my health care provider before I take this medicine? They need to know if you have any of these conditions: -history of irregular heart beat -kidney disease -an unusual or allergic reaction to fluconazole, other azole antifungals, medicines, foods, dyes, or preservatives -pregnant or trying to get pregnant -breast-feeding How should I use this medicine? Take this medicine by mouth. Follow the directions on the prescription label. Do not take your medicine more often than directed. Talk to your pediatrician regarding the use of this medicine in children. Special care may be needed. This medicine has been used in children as young as 83 months of age. Overdosage: If you think you have taken too much of this medicine contact a poison control center or emergency room at once. NOTE: This medicine is only for you. Do not share this medicine with others. What if I miss a dose? If you miss a dose, take it as soon as you can. If it is almost time for your next dose, take only that dose. Do not take double or extra doses. What may interact with this medicine? Do not take this medicine with any of the following medications: -astemizole -certain medicines for  irregular heart beat like dofetilide, dronedarone, quinidine -cisapride -erythromycin -lomitapide -other medicines that prolong the QT interval (cause an abnormal heart rhythm) -pimozide -terfenadine -thioridazine -tolvaptan -ziprasidone This medicine may also interact with the following medications: -antiviral medicines for HIV or AIDS -birth control pills -certain antibiotics like rifabutin, rifampin -certain medicines for blood pressure like amlodipine, isradipine, felodipine, hydrochlorothiazide, losartan, nifedipine -certain medicines for cancer like cyclophosphamide, vinblastine, vincristine -certain medicines for cholesterol like atorvastatin, lovastatin, fluvastatin, simvastatin -certain medicines for depression, anxiety, or psychotic disturbances like amitriptyline, midazolam, nortriptyline, triazolam -certain medicines for diabetes like glipizide, glyburide, tolbutamide -certain medicines for pain like alfentanil, fentanyl, methadone -certain medicines for seizures like carbamazepine, phenytoin -certain medicines that treat or prevent blood clots like warfarin -halofantrine -medicines that lower your chance of fighting infection like cyclosporine, prednisone, tacrolimus -NSAIDS, medicines for pain and inflammation, like celecoxib, diclofenac, flurbiprofen, ibuprofen, meloxicam, naproxen -other medicines for fungal infections -sirolimus -theophylline -tofacitinib This list may not describe all possible interactions. Give your health care provider a list of all the medicines, herbs, non-prescription drugs, or dietary supplements you use. Also tell them if you smoke, drink alcohol, or use illegal drugs. Some items may interact with your medicine. What should I watch for while using this medicine? Visit your doctor or health care professional for regular checkups. If you are taking this medicine for a long time you may need blood work. Tell your doctor if your symptoms do not  improve. Some fungal infections need  many weeks or months of treatment to cure. Alcohol can increase possible damage to your liver. Avoid alcoholic drinks. If you have a vaginal infection, do not have sex until you have finished your treatment. You can wear a sanitary napkin. Do not use tampons. Wear freshly washed cotton, not synthetic, panties. What side effects may I notice from receiving this medicine? Side effects that you should report to your doctor or health care professional as soon as possible: -allergic reactions like skin rash or itching, hives, swelling of the lips, mouth, tongue, or throat -dark urine -feeling dizzy or faint -irregular heartbeat or chest pain -redness, blistering, peeling or loosening of the skin, including inside the mouth -trouble breathing -unusual bruising or bleeding -vomiting -yellowing of the eyes or skin Side effects that usually do not require medical attention (report to your doctor or health care professional if they continue or are bothersome): -changes in how food tastes -diarrhea -headache -stomach upset or nausea This list may not describe all possible side effects. Call your doctor for medical advice about side effects. You may report side effects to FDA at 1-800-FDA-1088. Where should I keep my medicine? Keep out of the reach of children. Store at room temperature below 30 degrees C (86 degrees F). Throw away any medicine after the expiration date. NOTE: This sheet is a summary. It may not cover all possible information. If you have questions about this medicine, talk to your doctor, pharmacist, or health care provider.  2018 Elsevier/Gold Standard (2013-03-18 19:37:38)

## 2017-01-20 NOTE — Assessment & Plan Note (Signed)
Follows with [facility].  Well controlled on Descovy and Tivicay

## 2017-01-20 NOTE — Assessment & Plan Note (Signed)
Plan for repeat imaging in 1 year.

## 2017-01-20 NOTE — Progress Notes (Signed)
Subjective:    Patient ID: Shannon [name], female    DOB: February 09, 1956, 61 y.o.   MRN: [id number]  CC: 1 month follow up, meet new provider  HPI  Shannon [name] is a 61yo woman with moderate asthma, only on albuterol nebulizers, HTN, HIV, asthma, CKD 3, GERD, GAD who presents for follow up.  Shannon [name] is new to me, but has been a patient of the clinic for a few years.  She sees multiple specialists.   01/13/17 - saw GI, concerned for functional GI symptoms, but plans to check EGD/Colon to exclude other pathology.  Suggested increasing omeprazole to 40mg  daily.  Also suggested change of anxiety medication to duloxetine which may improve functional GI symptoms as well.  12/01/16 - Saw cardiology.  Noted BP remained uncontrolled. Appears she was not taking her medications as prescribed.  Dr. Oval Linsey gave her hydralazine to take PRN.  Planned for an MRI to look at renal cysts.    11/04/16 - saw ID.  BP not controlled, but HIV well controlled.  Continued current medications.    10/01/16 - saw pulmonary.  Patient had stopped symbicort, only on albuterol neb and MDI.  Considered pulmicort nebulizer at that visit.  She is doing well with overnight oxygen.  Sleep study in 2017 did not show OSA.    Shannon [name] has also been seen semi-regularly in our clinic for sinus complaints, continued SOB and dyspnea, low back pain.  She was most recently seen in the hospital for chest pain and SOB which was felt to be related to her asthma.    Today Shannon [name] reports that she continues to have congestion, but it has moved into her chest.  She notes that she is bringing up green phlegm.  She feels like she is getting sick again.  She is aware that she is allergic to [place], and she is taking loratadine with good results.  She is not taking any mucolytic.  I reviewed her last pulmonary note and she is not interested in long term controlling medication for her asthma.  She takes zinc with good results.     Much of the visit was spent in Shannon [name] informing me of her medical history.  She is frustrated with her decline in health, particularly her blood pressure.  She is frustrated with the fractured care amongst her specialists and PCP and feels that the resident clinic is not for her b/c she always sees someone different.  She has requested a change to me.  She does not like taking medications and particularly does not like taking her BP medications.  She notes that she has had HIV for a long time and feels like it is well controlled on current regimens.  However, she feels that her chronic HIV, chronic medications and asthma have caused her to lose muscle mass which she is determined to get back.  She reports to me that she is "41% fat" and she has lipodystrophy.  She has been on an off Abx for a sinus infection and feels she has a yeast infection.  She has burning on urination and burning down below.  She is due to get her mammogram and DEXA scan next month.  She told me about her medical history including a benign brain mass she had when she was 17, as well as fibroid uterus which she had removed a few years ago.  She is currently in graduate school to get  a degree in public health administration and enjoys working with the [employer].   I think she actually has a very high education level and good health literacy.  She attributes causation to things (such as her HIV "flaring") for some of her other health conditions which may or may not be related.  She is very concerned about her health and motivated to make appropriate lifestyle changes.  She is willing to see a nutritionist.  She likes to know her actual numbers for labs and not just generalities, and I will remember to provide her with this information in the future.     Review of Systems  Constitutional: Negative for activity change and unexpected weight change.  HENT: Positive for congestion.   Respiratory: Positive for cough. Negative for  shortness of breath and wheezing.   Cardiovascular: Negative for chest pain.  Genitourinary: Positive for dysuria and urgency. Negative for difficulty urinating.  Allergic/Immunologic: Positive for environmental allergies. Negative for immunocompromised state.       Objective:   Physical Exam  Constitutional: She is oriented to person, place, and time. She appears well-developed and well-nourished. No distress.  HENT:  Head: Normocephalic and atraumatic.  Pulmonary/Chest: Effort normal and breath sounds normal. No respiratory distress. She has no wheezes.  Musculoskeletal: She exhibits no edema or tenderness.  Thin lower extremities  Neurological: She is oriented to person, place, and time.  Skin: No rash noted.  Sheared wound on left shin, healing.   Psychiatric: She has a normal mood and affect. Her behavior is normal.    Urinalysis today.        Assessment & Plan:  RTC in 1-2 months.   Dysuria, vaginal burning - Treat empirically for yeast infection given her recent courses of Abx - Fluconazole 150mg  once and then at 72 hours - Check UA and UC today.

## 2017-01-20 NOTE — Assessment & Plan Note (Signed)
She is using Oxygen at home, based on review of Dr. Corrie Dandy' note, her most recent sleep study did not show OSA but only nocturnal hypoxia.  Likely related to asthma.   Continue oxygen at night Pulmonary rehab.

## 2017-01-20 NOTE — Assessment & Plan Note (Signed)
She is very interested in seeing a nutritionist and starting an exercise program.  She believes that she has lipodystrophy from previous HIV regimens and would like to decrease her body fat.   Plan MNT consult Pulmonary rehab to be arranged by Pulmonary

## 2017-01-20 NOTE — Assessment & Plan Note (Signed)
She is supposed to have an MR of the abdomen.  Will remind her to schedule at next visit.

## 2017-01-20 NOTE — Assessment & Plan Note (Signed)
She notes that she does have some anxiety and will have panic attacks.  She is taking citalopram without issue.  We discussed the recommendation to change to duloxetine, but she was not interested at this time.  I think more discussion and an improved relationship with PCP (me) will help with changes in therapy.   Plan Continue citalopram.

## 2017-01-21 LAB — URINALYSIS, ROUTINE W REFLEX MICROSCOPIC
BILIRUBIN UA: NEGATIVE
Glucose, UA: NEGATIVE
Ketones, UA: NEGATIVE
Leukocytes, UA: NEGATIVE
NITRITE UA: NEGATIVE
PH UA: 6 (ref 5.0–7.5)
Protein, UA: NEGATIVE
RBC, UA: NEGATIVE
Specific Gravity, UA: 1.018 (ref 1.005–1.030)
UUROB: 0.2 mg/dL (ref 0.2–1.0)

## 2017-01-22 ENCOUNTER — Ambulatory Visit: Payer: Medicare Other | Admitting: Cardiovascular Disease

## 2017-01-22 ENCOUNTER — Ambulatory Visit (INDEPENDENT_AMBULATORY_CARE_PROVIDER_SITE_OTHER): Payer: Medicare Other | Admitting: Cardiovascular Disease

## 2017-01-22 ENCOUNTER — Encounter: Payer: Self-pay | Admitting: Cardiovascular Disease

## 2017-01-22 VITALS — BP 170/110 | HR 84 | Ht 63.0 in | Wt 229.8 lb

## 2017-01-22 DIAGNOSIS — I10 Essential (primary) hypertension: Secondary | ICD-10-CM

## 2017-01-22 DIAGNOSIS — D849 Immunodeficiency, unspecified: Secondary | ICD-10-CM | POA: Diagnosis not present

## 2017-01-22 DIAGNOSIS — B20 Human immunodeficiency virus [HIV] disease: Secondary | ICD-10-CM

## 2017-01-22 DIAGNOSIS — R0602 Shortness of breath: Secondary | ICD-10-CM

## 2017-01-22 DIAGNOSIS — I351 Nonrheumatic aortic (valve) insufficiency: Secondary | ICD-10-CM | POA: Diagnosis not present

## 2017-01-22 DIAGNOSIS — D899 Disorder involving the immune mechanism, unspecified: Secondary | ICD-10-CM

## 2017-01-22 DIAGNOSIS — B029 Zoster without complications: Secondary | ICD-10-CM

## 2017-01-22 LAB — URINE CULTURE

## 2017-01-22 MED ORDER — VALACYCLOVIR HCL 1 G PO TABS: 1000.0000 mg | ORAL_TABLET | Freq: Three times a day (TID) | ORAL | 0 refills | Status: DC

## 2017-01-22 MED ORDER — AZITHROMYCIN 250 MG PO TABS: ORAL_TABLET | ORAL | 0 refills | Status: DC

## 2017-01-22 MED ORDER — CARVEDILOL 25 MG PO TABS: 25.0000 mg | ORAL_TABLET | Freq: Every day | ORAL | 5 refills | Status: DC

## 2017-01-22 NOTE — Progress Notes (Signed)
Cardiology Office Note   Date:  01/22/2017   ID:  Shannon [name], DOB [birth year], MRN [id number]  PCP:  [name], MD  Cardiologist:   Skeet Latch, MD  ID: [name], MD  Chief Complaint  Patient presents with  . Follow-up    SOB, pt c/o coughing up green phlem     History of Present Illness: Shannon [name] is a 61 y.o. female with hypertension, asthma, non-obstructive CAD, OSA, prior PE, moderate pulmonary hypertension, and HIV (well-controlled)  who presents for follow up.  Shannon [name] was initially referred by her infectious disease doctor, [name], MD, on 12/26/15. [name] was concerned about her lower extremity edema and shortness of breath.  She was started on lasix 20 mg every other day.  Shannon [name] noted shortness of breath since her hospitalization for pneumonia 06/2015.   She denied lower extremity edema or orthopnea but did report chest pain when laying down at night.  She was referred for an echo 01/2016 that revealed LVEF 60-65% awith grade 1 diastolic dysfunction and hypokinesis of the basal inferior wall.  She also had very mild aortic stenosis with a mean gradient of 9 mmHg and trivial AR.  PASP was 52 mmHg.  She had a Lexiscan Myoview at that time that was negative for ischemia.  [name] previously had a Lexiscan Cardiolite in September 2013 that revealed a medium, mild reversible defect in the anterior wall and normal systolic function. She subsequently underwent cardiac catheterization that revealed nonobstructive coronary disease. She had mild pulmonary hypertension and a normal cardiac output. Left ventriculography revealed mild to moderate aortic regurgitation. She had an echo May 2016 that revealed an ejection fraction of 41% and mild diastolic dysfunction. It also revealed calcification of the aortic valve, mild to moderate aortic regurgitation, and mild aortic stenosis. Peak velocity was 2.1 m/s. She had a 24-hour Holter in February 2015 that revealed  rare PACs and PVCs.  Since her last appointment Shannon [name] was admitted to the hospital 12/2016 with chest pain. Cardiac enzymes were negative 4. EKG did not reveal any ischemic changes. She reports that when EMS arrived her systolic BP was >324 mmHg.  Her chest pain was felt to be noncardiac and reproducible with palpation. She had an echocardiogram that revealed LVEF 60-65% with mild LVH and a mean aortic valve gradient of 8 mmHg.  Tricuspid regurgitation was not significant enough to assess pulmonary pressures.  BNP was not elevated.  She also had a CTA of the chest that hospitalization that was negative for pulmonary embolism and revealed a mild ascending aortic aneurysm (4.0 cm).  She was also treated for bronchitis with Augmentin and prednisone.  She reports recurrent congestion, diaphoresis and productive cough.  She was also treated for shingles on her L leg.  The lesions improved and then recurred two days ago.  Lately Shannon [name] reports that her BP has been in the 140s-150s when she checks it at home.  She notes that she only takes carvedilol once per day because "I have too many medications in my system."  She is also constipated, so she doesn't think that she eliminates the medicine quickly enough.  She takes Miralax without relief.  She is scheduled for upper and lower endoscopies with [name].    Shannon [name] continues to study at [employer].  She is taking summer school and is working towards a degree in healthcare administration.    Past Medical History:  Diagnosis Date  .  Anemia   . Anxiety    HX PANIC ATTACKS  . Arthritis    "starting to; in my hands" (07/09/2015)  . Chronic asthma with acute exacerbation    "I have chronic asthma all the time; sometimes exacerbations" (07/09/2015)  . Chronic lower back pain   . Cyst of right kidney    "3 of them; dx'd in ~ 01/2015"  . GERD (gastroesophageal reflux disease)   . Heart murmur   . History of blood transfusion    "related to my brain surgery  I think"  . History of pulmonary embolism 07/09/2015  . HIV antibody positive ([place])   . HIV disease ([place])   . Hyperlipidemia   . Hypertension   . Lipodystrophy   . Pneumonia 07/09/2015  . Shingles   . Sleep apnea    "never completed part 2 of study; never wore mask" (07/09/2015)    Past Surgical History:  Procedure Laterality Date  . ABDOMINAL HYSTERECTOMY     "robotic laparosopic"  . BRAIN SURGERY  1974   "brain tumor; benign; on top of my brain; got a plate in there"  . CARDIAC CATHETERIZATION    . TONSILLECTOMY AND ADENOIDECTOMY       Current Outpatient Prescriptions  Medication Sig Dispense Refill  . acetaminophen (TYLENOL) 500 MG tablet Take 500 mg by mouth every 6 (six) hours as needed for mild pain.     [name] albuterol (PROAIR HFA) 108 (90 Base) MCG/ACT inhaler Inhale 2 puffs into the lungs every 4 (four) hours as needed for wheezing or shortness of breath. 6.7 g 3  . albuterol (PROVENTIL) (2.5 MG/3ML) 0.083% nebulizer solution Take 3 mLs (2.5 mg total) by nebulization every 6 (six) hours as needed for wheezing or shortness of breath. 75 mL 3  . Ascorbic Acid (VITAMIN C) 1000 MG tablet Take 1,000 mg by mouth daily.    . carvedilol (COREG) 25 MG tablet Take 1 tablet (25 mg total) by mouth daily. 60 tablet 5  . citalopram (CELEXA) 10 MG tablet Take 10 mg by mouth daily.    . cyanocobalamin (,VITAMIN B-12,) 1000 MCG/ML injection Inject 1 mL (1,000 mcg total) into the muscle every 30 (thirty) days. 1 mL 6  . DESCOVY 200-25 MG tablet TAKE 1 TABLET BY MOUTH DAILY 30 tablet 5  . fluconazole (DIFLUCAN) 150 MG tablet Take once and then 72 hours later. 2 tablet 0  . fluticasone (FLONASE) 50 MCG/ACT nasal spray Place 2 sprays into both nostrils daily. (Patient taking differently: Place 2 sprays into both nostrils daily as needed for allergies. ) 16 g 5  . hydrALAZINE (APRESOLINE) 25 MG tablet     . loratadine (CLARITIN) 10 MG tablet Take 1 tablet (10 mg total) by mouth daily as needed  for allergies. 30 tablet 2  . losartan-hydrochlorothiazide (HYZAAR) 100-25 MG tablet Take 1 tablet by mouth daily. (Patient taking differently: Take 1 tablet by mouth daily as needed (hypertension). ) 30 tablet 5  . Multiple Vitamin (MULTIVITAMIN WITH MINERALS) TABS tablet Take 1 tablet by mouth daily. (Patient taking differently: Take 1 tablet by mouth daily as needed (immune support). ) 90 tablet   . NIFEdipine (PROCARDIA XL/ADALAT-CC) 60 MG 24 hr tablet Take 1 tablet (60 mg total) by mouth daily. 30 tablet 11  . omeprazole (PRILOSEC) 40 MG capsule Take 1 capsule (40 mg total) by mouth daily before breakfast. 30 capsule 11  . OXYGEN Inhale into the lungs. 2.5 to 3 liters at night and as needed in  the daytime.    [name] POTASSIUM PO Take 1 tablet by mouth daily as needed. With lasix    . SYRINGE/NEEDLE, DISP, 1 ML (B-D SYRINGE/NEEDLE 1CC/25GX5/8) 25G X 5/8" 1 ML MISC 1 Units by Does not apply route every 30 (thirty) days. 50 each 0  . Tetrahydrozoline HCl (VISINE OP) Place 2 drops into both eyes as needed (for dry eyes).    [name] TIVICAY 50 MG tablet TAKE 1 TABLET BY MOUTH DAILY 30 tablet 5  . valACYclovir (VALTREX) 1000 MG tablet Take 1 tablet (1,000 mg total) by mouth 3 (three) times daily. 63 tablet 0  . azithromycin (ZITHROMAX Z-PAK) 250 MG tablet 2 TABLETS ON DAY ONE AND THEN 1 DAILY UNTIL FINISHED 6 each 0   No current facility-administered medications for this visit.     Allergies:   Tree extract and Ciprofloxacin    Social History:  The patient  reports that she has never smoked. She has never used smokeless tobacco. She reports that she drinks alcohol. She reports that she does not use drugs.   Family History:  The patient's family history includes Asthma in her mother; Diabetes in her sister; Heart failure in her mother; Heart murmur in her brother, sister, and sister; Thyroid disease in her sister.    ROS:  Please see the history of present illness.   Otherwise, review of systems are  positive for leg pain   All other systems are reviewed and negative.   PHYSICAL EXAM: VS:  BP (!) 170/110   Pulse 84   Ht 5\' 3"  (1.6 m)   Wt 104.2 kg (229 lb 12.8 oz)   SpO2 94%   BMI 40.71 kg/m  , BMI Body mass index is 40.71 kg/m. GENERAL:  Well appearing.  No acute distress. HEENT:  Pupils equal round and reactive, fundi not visualized, oral mucosa unremarkable NECK:  No jugular venous distention, waveform within normal limits, carotid upstroke brisk and symmetric, no bruits LUNGS:  Poor air movement.  No wheezes, rhonchi or crackles.   HEART:  RRR.  PMI not displaced or sustained,S1 and S2 within normal limits, no S3, no S4, no clicks, no rubs, II/VI early-peaking systolic murmur at the right upper sternal border. I/IV diastolic murmur at the left upper sternal border.  ABD:  Central adiposity. Positive bowel sounds normal in frequency in pitch, no bruits, no rebound, no guarding, no midline pulsatile mass, no hepatomegaly, no splenomegaly EXT:  2 plus pulses throughout, no edema, no cyanosis no clubbing SKIN:  No rashes no nodules.  5-6 erythematous pustules on lateral L leg. NEURO:  Cranial nerves II through XII grossly intact, motor grossly intact throughout PSYCH:  Cognitively intact, oriented to person place and time   EKG:  EKG is not ordered today. The ekg ordered 12/31/15 shows sinus rhythm. Rate 89 bpm. 2 PVCs. 10/28/16: Sinus rhythm. Rate 93 bpm  Coronary angiography 06/01/12:10-20% OM, diffuse 20-30% LAD, 10-20% D1, 20% diffuse RCA RA 10, RV 39/12, RV EDP 12, capillary wedge pressure 19, PA 39/21, mean 27, LV 116/14, LVEDP 20, aorta 115/77 Cardiac output (Fick) 6.55, cardiac index 3.2 PVR 1.7 with units, SVR 12 with units. She P/QRS ratio 1 LVEF 80%. Aortic root mildly dilated. 1-2+ aortic regurgitation.  24-hour Holter 09/26/13: Sinus rhythm, rare PACs, rare PVCs  Echo 01/22/15: LVEF 70%. Mild LVH. Mild diastolic dysfunction. Normal RV function. Left atrium moderately  enlarged. Right atrium mildly enlarged. Trace mitral regurgitation. Calcific aortic valve. Mild to moderate aortic regurgitation. Mild aortic  stenosis. Peak velocity 2.1 m/s trace tricuspid regurgitation    Lexiscan Myoview 01/2016:   The left ventricular ejection fraction is normal (55-65%). The EF is 60% visually. The computer generated EF is not calculated correctly and is therefore not reported.  There was no ST segment deviation noted during stress.  The study is normal. no ischemia . no infarction   Echo 01/29/16: Study Conclusions  - Left ventricle: The cavity size was normal. Wall thickness was   increased in a pattern of moderate LVH. Systolic function was   normal. The estimated ejection fraction was in the range of 60%   to 65%. Basal inferior hypokinesis. Doppler parameters are   consistent with abnormal left ventricular relaxation (grade 1   diastolic dysfunction). - Aortic valve: Trileaflet; moderately calcified leaflets. There   was very mild stenosis. There was mild regurgitation. Mean   gradient (S): 9 mm Hg. Valve area (VTI): 1.68 cm^2. - Mitral valve: There was trivial regurgitation. - Left atrium: The atrium was mildly dilated. - Right ventricle: The cavity size was normal. Systolic function   was normal. - Tricuspid valve: Peak RV-RA gradient (S): 44 mm Hg. - Pulmonary arteries: PA peak pressure: 52 mm Hg (S). - Systemic veins: IVC measured 2.3 cm with > 50% respirophasic   variation, suggesting RA pressure 8 mmHg.  Impressions:  - Normal LV size with moderate LV hypertrophy. EF 60-65%. Basal   inferior hypokinesis. Normal RV size and systolic function. Very   mild aortic stenosis, mild aortic insufficieny. Moderate   pulmonary hypertension.   Recent Labs: 03/31/2016: ALT 15 01/05/2017: B Natriuretic Peptide 41.6 01/06/2017: BUN 16; Creatinine, Ser 1.10; Hemoglobin 12.2; Platelets 187; Potassium 4.0; Sodium 141    Lipid Panel    Component Value  Date/Time   CHOL 217 (H) 11/26/2016 1618   TRIG 88 11/26/2016 1618   HDL 67 11/26/2016 1618   CHOLHDL 3.2 11/26/2016 1618   VLDL 18 11/26/2016 1618   LDLCALC 132 (H) 11/26/2016 1618      Wt Readings from Last 3 Encounters:  01/22/17 104.2 kg (229 lb 12.8 oz)  01/20/17 104.3 kg (230 lb)  01/14/17 104.3 kg (230 lb)      ASSESSMENT AND PLAN:  # Hypertension:  BP is above goal and has been consistently for the last month.  She has only been taking carvedilol once daily and only 12.5mg  instead of 25mg .  After much discussion she agreed to take 25mg  bid.  She is very afraid that her BP will get too low.  I asked her to call me next week with her BP readings so that we can adjust if necessary.  Continue losartan/HCTZ, nifedipine and prn hydralazine.   # Shingles: She has recurrent shingles after taking valacyclovir for 10 days.  We will give her 21 days.  Asked her to follow up with her ID doctor as well.   # Aortic regurgitation # Aortic stenosis: Ms. Spradlin has very mild aortic stenosis (mean gradient 9 mmHg) and mild aortic regurgitation.  Continue to monitor.    # Hyperlipidemia: Will address at follow up.  # URI: Will give Z-pack.  Follow up with ID as well.   Current medicines are reviewed at length with the patient today.  The patient does not have concerns regarding medicines.  The following changes have been made:  Increase carvedilol to 25mg  bid.    Time spent: 50 minutes-Greater than 50% of this time was spent in counseling, explanation of diagnosis, planning of further  management, and coordination of care.  Labs/ tests ordered today include:   No orders of the defined types were placed in this encounter.    Disposition:   FU with Thanh Mottern C. Oval Linsey, MD, [facility] in 6-8 weeks.    This note was written with the assistance of speech recognition software.  Please excuse any transcriptional errors.  Signed, Branndon Tuite C. Oval Linsey, MD, [facility]  01/22/2017 1:19 PM    [facility]

## 2017-01-22 NOTE — Patient Instructions (Signed)
Medication Instructions:  START VALTREX THREE TIMES A DAY FOR 21 DAYS  START ZPAK 2 TABLETS ON DAY ONE AND THEN 1 DAILY UNTIL FINISHED  START CARVEDILOL 25 MG TWICE A DAY   Labwork: NONE  Testing/Procedures: NONE  Follow-Up: Your physician recommends that you schedule a follow-up appointment in: 6-8 WEEKS   Any Other Special Instructions Will Be Listed Below (If Applicable). MONITOR YOUR BLOOD PRESSURES FOR 1 WEEK AND CALL OFFICE [phone]) WITH THE READINGS  YOU NEED TO CALL YOUR INFECTIOUS DISEASE DOCTOR TODAY   If you need a refill on your cardiac medications before your next appointment, please call your pharmacy.

## 2017-01-25 ENCOUNTER — Other Ambulatory Visit: Payer: Self-pay

## 2017-01-25 ENCOUNTER — Emergency Department (HOSPITAL_COMMUNITY): Payer: Medicare Other

## 2017-01-25 ENCOUNTER — Emergency Department (HOSPITAL_COMMUNITY)
Admission: EM | Admit: 2017-01-25 | Discharge: 2017-01-25 | Disposition: A | Discharge status: Home / Self Care | Payer: Medicare Other | Attending: Emergency Medicine | Admitting: Emergency Medicine

## 2017-01-25 ENCOUNTER — Encounter (HOSPITAL_COMMUNITY): Payer: Self-pay | Admitting: Emergency Medicine

## 2017-01-25 DIAGNOSIS — Z79899 Other long term (current) drug therapy: Secondary | ICD-10-CM | POA: Diagnosis not present

## 2017-01-25 DIAGNOSIS — N183 Chronic kidney disease, stage 3 (moderate): Secondary | ICD-10-CM | POA: Insufficient documentation

## 2017-01-25 DIAGNOSIS — R0789 Other chest pain: Secondary | ICD-10-CM | POA: Insufficient documentation

## 2017-01-25 DIAGNOSIS — R0602 Shortness of breath: Secondary | ICD-10-CM | POA: Diagnosis not present

## 2017-01-25 DIAGNOSIS — I129 Hypertensive chronic kidney disease with stage 1 through stage 4 chronic kidney disease, or unspecified chronic kidney disease: Secondary | ICD-10-CM | POA: Diagnosis not present

## 2017-01-25 DIAGNOSIS — I10 Essential (primary) hypertension: Secondary | ICD-10-CM

## 2017-01-25 DIAGNOSIS — R030 Elevated blood-pressure reading, without diagnosis of hypertension: Secondary | ICD-10-CM | POA: Diagnosis not present

## 2017-01-25 LAB — CBC
HCT: 38.2 % (ref 36.0–46.0)
HEMOGLOBIN: 12.1 g/dL (ref 12.0–15.0)
MCH: 30.2 pg (ref 26.0–34.0)
MCHC: 31.7 g/dL (ref 30.0–36.0)
MCV: 95.3 fL (ref 78.0–100.0)
Platelets: 189 10*3/uL (ref 150–400)
RBC: 4.01 MIL/uL (ref 3.87–5.11)
RDW: 13 % (ref 11.5–15.5)
WBC: 6.8 10*3/uL (ref 4.0–10.5)

## 2017-01-25 LAB — I-STAT TROPONIN, ED
TROPONIN I, POC: 0.01 ng/mL (ref 0.00–0.08)
TROPONIN I, POC: 0 ng/mL (ref 0.00–0.08)

## 2017-01-25 LAB — BASIC METABOLIC PANEL
ANION GAP: 7 (ref 5–15)
BUN: 11 mg/dL (ref 6–20)
CALCIUM: 9.7 mg/dL (ref 8.9–10.3)
CO2: 29 mmol/L (ref 22–32)
Chloride: 102 mmol/L (ref 101–111)
Creatinine, Ser: 1.33 mg/dL — ABNORMAL HIGH (ref 0.44–1.00)
GFR, EST AFRICAN AMERICAN: 49 mL/min — AB (ref >60)
GFR, EST NON AFRICAN AMERICAN: 42 mL/min — AB (ref >60)
Glucose, Bld: 111 mg/dL — ABNORMAL HIGH (ref 65–99)
Potassium: 3.8 mmol/L (ref 3.5–5.1)
SODIUM: 138 mmol/L (ref 135–145)

## 2017-01-25 MED ORDER — SODIUM CHLORIDE 0.9 % IV BOLUS (SEPSIS)
1000.0000 mL | Freq: Once | INTRAVENOUS | Status: AC
  Administered 2017-01-25: 1000 mL via INTRAVENOUS

## 2017-01-25 MED ORDER — IPRATROPIUM-ALBUTEROL 0.5-2.5 (3) MG/3ML IN SOLN
3.0000 mL | Freq: Once | RESPIRATORY_TRACT | Status: AC
  Administered 2017-01-25: 3 mL via RESPIRATORY_TRACT
  Filled 2017-01-25: qty 3

## 2017-01-25 MED ORDER — NITROGLYCERIN 0.4 MG SL SUBL
0.4000 mg | SUBLINGUAL_TABLET | SUBLINGUAL | Status: DC | PRN
  Administered 2017-01-25 (×2): 0.4 mg via SUBLINGUAL
  Filled 2017-01-25: qty 1

## 2017-01-25 NOTE — ED Provider Notes (Signed)
[facility] DEPT Provider Note   CSN: [id number] Arrival date & time: 01/25/17  7654     History   Chief Complaint Chief Complaint  Patient presents with  . Chest Pain    HPI ATTALLAH ONTKO is a 61 y.o. female.  HPI   KENDRICK REMIGIO is a 61 y.o. female, with a history of Asthma, HIV, HTN, anxiety, and sleep apnea, presenting to the ED with high blood pressure. States she took her BP at home and found it to be 215/100. States, "I saw it was high so I thought I should come in." Also complains of persistent chest pain, central chest, 3/10, pressure, nonradiating. Complains of shortness of breath and persistent productive cough with green sputum. Uses 2 lpm O2, usually at night. States her chest discomfort and increased shortness of breath began after seeing that her blood pressure was high, however, she also states she has had this same chest pain and shortness of breath for months. Her symptoms have improved since arriving in the ED. She received 324 mg of aspirin and 1 sublingual nitroglycerin.  Pt states, "I have infections. Multiple infections. I have been seen in the emergency room 5 times in the last few months. I caught a respiratory infection that never went away."  "I'm also dehydrated and I'm not able to flush all the medications and bad stuff out so that's what's keeping me with infections."  Patient has no history of cardiac cath, CABG, MI, CHF, or any known cardiac abnormality. Negative CT PE study on 5/15. Admitted for CP rule out during that visit. Saw her pulmonologist, Dr. Corrie Dandy, on 01/14/17. Saw her cardiologist, Skeet Latch, on 01/22/17. Prescribed azithromycin.  HIV RNA Quant and CD4 count on 11/26/16: Undetectable and 730, respectively.  Denies fever/chills, peripheral edema, N/V/C/D, urinary complaints, abdominal pain, or any other complaints.    Past Medical History:  Diagnosis Date  . Anemia   . Anxiety    HX PANIC ATTACKS  . Arthritis    "starting to;  in my hands" (07/09/2015)  . Chronic asthma with acute exacerbation    "I have chronic asthma all the time; sometimes exacerbations" (07/09/2015)  . Chronic lower back pain   . Cyst of right kidney    "3 of them; dx'd in ~ 01/2015"  . GERD (gastroesophageal reflux disease)   . Heart murmur   . History of blood transfusion    "related to my brain surgery I think"  . History of pulmonary embolism 07/09/2015  . HIV antibody positive ([place])   . HIV disease ([place])   . Hyperlipidemia   . Hypertension   . Lipodystrophy   . Pneumonia 07/09/2015  . Shingles   . Sleep apnea    "never completed part 2 of study; never wore mask" (07/09/2015)    Patient Active Problem List   Diagnosis Date Noted  . Morbid obesity with BMI of 40.0-44.9, adult ([place]) 01/13/2017  . On home oxygen therapy 01/13/2017  . Ascending aorta dilatation (HCC) 01/07/2017  . Low back pain radiating to right lower extremity 12/02/2016  . Bilateral renal cysts 12/02/2016  . Chronic kidney disease (CKD), stage III (moderate) 12/01/2016  . Generalized anxiety disorder 10/22/2016  . Hypersomnia 10/01/2016  . BPPV (benign paroxysmal positional vertigo) 09/18/2016  . Nocturnal hypoxemia 05/13/2016  . Cervical radiculopathy 04/14/2016  . GERD (gastroesophageal reflux disease) 01/02/2016  . Exertional dyspnea 12/04/2015  . Vitamin B12 deficiency 10/02/2015  . Inappropriate diet and eating habits 08/15/2015  .  Opacity noted on imaging study   . Dyspnea 07/09/2015  . Asthma, moderate persistent 07/09/2015  . HIV disease ([place]) 07/09/2015  . Essential hypertension 07/09/2015  . Mild diastolic dysfunction 95/28/4132    Past Surgical History:  Procedure Laterality Date  . ABDOMINAL HYSTERECTOMY     "robotic laparosopic"  . BRAIN SURGERY  1974   "brain tumor; benign; on top of my brain; got a plate in there"  . CARDIAC CATHETERIZATION    . TONSILLECTOMY AND ADENOIDECTOMY      OB History    No data available        Home Medications    Prior to Admission medications   Medication Sig Start Date End Date Taking? Authorizing Provider  acetaminophen (TYLENOL) 500 MG tablet Take 500 mg by mouth every 6 (six) hours as needed for mild pain.    Yes [provider]  albuterol (PROAIR HFA) 108 (90 Base) MCG/ACT inhaler Inhale 2 puffs into the lungs every 4 (four) hours as needed for wheezing or shortness of breath. 01/14/17  Yes de Dios, [place] A, MD  albuterol (PROVENTIL) (2.5 MG/3ML) 0.083% nebulizer solution Take 3 mLs (2.5 mg total) by nebulization every 6 (six) hours as needed for wheezing or shortness of breath. 01/14/17  Yes de Dios, Sa[place], MD  azithromycin (ZITHROMAX Z-PAK) 250 MG tablet 2 TABLETS ON DAY ONE AND THEN 1 DAILY UNTIL FINISHED Patient taking differently: Take 250-500 mg by mouth daily. 2 TABLETS ON DAY ONE AND THEN 1 DAILY UNTIL FINISHED 01/22/17  Yes Skeet Latch, MD  carvedilol (COREG) 25 MG tablet Take 1 tablet (25 mg total) by mouth daily. 01/22/17  Yes Skeet Latch, MD  citalopram (CELEXA) 10 MG tablet Take 10 mg by mouth daily.   Yes [provider]  cyanocobalamin (,VITAMIN B-12,) 1000 MCG/ML injection Inject 1 mL (1,000 mcg total) into the muscle every 30 (thirty) days. 01/20/17  Yes [name], MD  DESCOVY 200-25 MG tablet TAKE 1 TABLET BY MOUTH DAILY 01/07/17  Yes [name], MD  fluconazole (DIFLUCAN) 150 MG tablet Take once and then 72 hours later. Patient taking differently: 150 mg once. Take once and then 72 hours later. 01/20/17  Yes [name], MD  fluticasone (FLONASE) 50 MCG/ACT nasal spray Place 2 sprays into both nostrils daily. Patient taking differently: Place 2 sprays into both nostrils daily as needed for allergies.  10/01/16  Yes Lucious Groves, DO  loratadine (CLARITIN) 10 MG tablet Take 1 tablet (10 mg total) by mouth daily as needed for allergies. 06/10/16 06/10/17 Yes Alphonzo Grieve, MD  losartan (COZAAR) 100 MG  tablet Take 100 mg by mouth daily. hypertension 01/20/17  Yes [provider]  Multiple Vitamin (MULTIVITAMIN WITH MINERALS) TABS tablet Take 1 tablet by mouth daily. Patient taking differently: Take 1 tablet by mouth every other day.  08/29/15  Yes Rice, Resa Miner, MD  NIFEdipine (PROCARDIA XL/ADALAT-CC) 60 MG 24 hr tablet Take 1 tablet (60 mg total) by mouth daily. 04/17/16 04/17/17 Yes Skeet Latch, MD  omeprazole (PRILOSEC) 40 MG capsule Take 1 capsule (40 mg total) by mouth daily before breakfast. 01/13/17  Yes Gatha Mayer, MD  OXYGEN Inhale 2.5-3 L into the lungs at bedtime as needed (Shortness of Breath). 2.5 to 3 liters at night and as needed in the daytime.    Yes [provider]  Tetrahydrozoline HCl (VISINE OP) Place 2 drops into both eyes as needed (for dry eyes).   Yes  [provider]  TIVICAY 50 MG tablet TAKE 1 TABLET BY MOUTH DAILY 01/07/17  Yes [name], MD  valACYclovir (VALTREX) 1000 MG tablet Take 1 tablet (1,000 mg total) by mouth 3 (three) times daily. Patient taking differently: Take 1,000 mg by mouth daily.  01/22/17  Yes Skeet Latch, MD  losartan-hydrochlorothiazide (HYZAAR) 100-25 MG tablet Take 1 tablet by mouth daily. Patient taking differently: Take 1 tablet by mouth daily as needed (If Blood pressure is over 160).  10/28/16   Skeet Latch, MD    Family History Family History  Problem Relation Age of Onset  . Asthma Mother   . Heart failure Mother        cardiomyopathy  . Heart murmur Sister   . Heart murmur Brother   . Diabetes Sister   . Thyroid disease Sister   . Heart murmur Sister     Social History Social History  Substance Use Topics  . Smoking status: Never Smoker  . Smokeless tobacco: Never Used  . Alcohol use 0.0 oz/week     Comment: Rarely.     Allergies   Tree extract and Ciprofloxacin   Review of Systems Review of Systems  Constitutional: Negative for chills, diaphoresis and fever.   Respiratory: Positive for cough and shortness of breath.   Cardiovascular: Positive for chest pain.  Gastrointestinal: Negative for abdominal pain, nausea and vomiting.  All other systems reviewed and are negative.    Physical Exam Updated Vital Signs BP (!) 185/119   Pulse 98   Temp 97.8 F (36.6 C)   Resp 20   SpO2 92%   Physical Exam  Constitutional: She appears well-developed and well-nourished. No distress.  HENT:  Head: Normocephalic and atraumatic.  Eyes: Conjunctivae are normal.  Neck: Neck supple.  Cardiovascular: Normal rate, regular rhythm, normal heart sounds and intact distal pulses.   Pulmonary/Chest: Effort normal and breath sounds normal. No respiratory distress.  Patient speaks in full sentences without apparent difficulty. No increased work of breathing at rest.   Abdominal: Soft. There is no tenderness. There is no guarding.  Musculoskeletal: She exhibits no edema.  Lymphadenopathy:    She has no cervical adenopathy.  Neurological: She is alert.  Skin: Skin is warm and dry. She is not diaphoretic.  Psychiatric: She has a normal mood and affect. Her behavior is normal.  Nursing note and vitals reviewed.    ED Treatments / Results  Labs (all labs ordered are listed, but only abnormal results are displayed) Labs Reviewed  BASIC METABOLIC PANEL - Abnormal; Notable for the following:       Result Value   Glucose, Bld 111 (*)    Creatinine, Ser 1.33 (*)    GFR calc non Af Amer 42 (*)    GFR calc Af Amer 49 (*)    All other components within normal limits  CBC  I-STAT TROPOININ, ED  I-STAT TROPOININ, ED    EKG  EKG Interpretation  Date/Time:  Monday January 25 2017 06:30:58 EDT Ventricular Rate:  103 PR Interval:    QRS Duration: 89 QT Interval:  368 QTC Calculation: 482 R Axis:   3 Text Interpretation:  Sinus tachycardia Abnormal R-wave progression, early transition Confirmed by Thayer Jew [phone]) on 01/25/2017 6:56:46 AM Also confirmed by  Thayer Jew [phone]), editor Verna Czech [id number])  on 01/25/2017 7:04:26 AM        Radiology Dg Chest 2 View  Result Date: 01/25/2017 CLINICAL DATA:  Shortness of breath. EXAM: CHEST  2 VIEW COMPARISON:  CT 01/05/2017.  Chest x-ray 01/05/2017, 04/13/2016. FINDINGS: Mediastinum and hilar structures normal. Stable cardiomegaly. Left mid lung field subsegmental atelectasis. No pleural effusion or pneumothorax . IMPRESSION: 1. Left mid lung field subsegmental atelectasis and/or scarring again noted. 2. No interim change. Stable cardiomegaly. No pulmonary venous congestion . Electronically Signed   By: Marcello Moores  Register   On: 01/25/2017 07:11    Procedures Procedures (including critical care time)  Medications Ordered in ED Medications  nitroGLYCERIN (NITROSTAT) SL tablet 0.4 mg (0.4 mg Sublingual Given 01/25/17 0810)  sodium chloride 0.9 % bolus 1,000 mL (0 mLs Intravenous Stopped 01/25/17 1115)  ipratropium-albuterol (DUONEB) 0.5-2.5 (3) MG/3ML nebulizer solution 3 mL (3 mLs Nebulization Given 01/25/17 4034)     Initial Impression / Assessment and Plan / ED Course  I have reviewed the triage vital signs and the nursing notes.  Pertinent labs & imaging results that were available during my care of the patient were reviewed by me and considered in my medical decision making (see chart for details).  Clinical Course as of Jan 26 1200  Mon Jan 25, 2017  0903 Patient voices complete resolution in her complaints of chest pain and shortness of breath following DuoNeb treatment. Patient had no improvement with nitroglycerin.  [SJ]  7425 Patient now requesting IV cipro. States this is what information gained from her web searches tells her she needs.   [SJ]    Clinical Course User Index [SJ] [place]yard C, PA-C    Patient presents with hypertension, chest pain, and shortness of breath. She states this is not a new problem for her. My suspicion for ACS related chest pain is low. Patient's symptoms  resolved completely shortly into her ED course and did not recur. Patient states, "I feel much better now that I have some reassurance." Speaking in full sentences without difficulty. SPO2 98% on room air. Ambulates without difficulty or assistance. Blood pressure returned to a better range. Patient encouraged to follow-up with her cardiologist. The patient was given instructions for home care as well as return precautions. Patient voices understanding of these instructions, accepts the plan, and is comfortable with discharge.    Was found to be noncompliant with her full dose of carvedilol during her visit to her cardiologist on 6/1. Patient states she is now compliant with her full dosing as of 6/2.    Findings and plan of care discussed with Dorie Rank, MD.   Vitals:   01/25/17 0630 01/25/17 0636 01/25/17 0658 01/25/17 0720  BP: (!) 185/119   (!) 178/96  Pulse: 98   90  Resp: 20   18  Temp:  97.8 F (36.6 C)    SpO2: 92%  93% 98%   Vitals:   01/25/17 1015 01/25/17 1030 01/25/17 1045 01/25/17 1115  BP: (!) 181/92 (!) 170/85 (!) 169/96 (!) 141/90  Pulse: 76 77 74 88  Resp: 15 18 19  (!) 27  Temp:      SpO2: 98% 97% 96% 99%  The respiratory rate of 27 is not accurate.   Final Clinical Impressions(s) / ED Diagnoses   Final diagnoses:  Essential hypertension    New Prescriptions New Prescriptions   No medications on file     Layla Maw 01/25/17 1203    Dorie Rank, MD 01/26/17 [phone]

## 2017-01-25 NOTE — Discharge Planning (Signed)
Pt requesting cab ride home.  EDCM offered public bus voucher as pt does not have medical condition (DME) or other restraints ([place]) that would prohibit her from utilizing bus.

## 2017-01-25 NOTE — Discharge Instructions (Signed)
Your lab results were reassuring. There were no signs of lung infection or other acute abnormality on chest x-ray. Follow-up with her pulmonologist and/or cardiologist on this matter. A medication adjustment may need to be made, however, this should be done on an outpatient basis if it is deemed necessary. Take all of your medications as prescribed.

## 2017-01-25 NOTE — ED Triage Notes (Signed)
Pt c/o chest pressure onset 3 hours ago, with shob. Pt reports recent stressors. Initial BP 215/135, pt took ASA324mg  at home, EMS gave NTG x 1, no change in chest pressure.  Pt uses 2L 02 at night.

## 2017-01-26 ENCOUNTER — Ambulatory Visit (INDEPENDENT_AMBULATORY_CARE_PROVIDER_SITE_OTHER): Payer: Medicare Other | Admitting: Internal Medicine

## 2017-01-26 ENCOUNTER — Encounter: Payer: Self-pay | Admitting: Internal Medicine

## 2017-01-26 VITALS — BP 172/118 | HR 98 | Temp 98.4°F | Ht 63.5 in | Wt 227.0 lb

## 2017-01-26 DIAGNOSIS — I10 Essential (primary) hypertension: Secondary | ICD-10-CM

## 2017-01-26 DIAGNOSIS — E881 Lipodystrophy, not elsewhere classified: Secondary | ICD-10-CM

## 2017-01-26 DIAGNOSIS — Z5181 Encounter for therapeutic drug level monitoring: Secondary | ICD-10-CM | POA: Diagnosis not present

## 2017-01-26 DIAGNOSIS — R05 Cough: Secondary | ICD-10-CM

## 2017-01-26 DIAGNOSIS — B20 Human immunodeficiency virus [HIV] disease: Secondary | ICD-10-CM

## 2017-01-26 DIAGNOSIS — R059 Cough, unspecified: Secondary | ICD-10-CM

## 2017-01-26 MED ORDER — FLUCONAZOLE 150 MG PO TABS: 150.0000 mg | ORAL_TABLET | Freq: Once | ORAL | 0 refills | Status: AC

## 2017-01-26 NOTE — Progress Notes (Signed)
RFV: follow up for hiv disease Patient ID: [name], female   DOB: 15-Mar-1956, 61 y.o.   MRN: [id number]  HPI Ms Shands is a 61yo F with longstanding hiv disease, CD 4 count of 720/VL<20, on tivicay/descovy. She has had several visits to the ED for HTN related CP, hospitalization for PE. She reports having 5 visits in the last 3 months. Most recent visit to ED was yesterday when she had home BP of 215/100. She states she is now taking her HTN meds but still feels like her BP is elevated. While in the ED she was monitored thorughout the stay and trended down to 150s on its own.  She feels htat she has overall infection that is causing her to have multiple symptoms, such as recurrence of shingles, yeast infection, non healing wound on her left  chronic cough/congestion, elevated BP, and lastly she would like to treat her lipodistrophy, central obesity.  She is concerned about all the medications she is doing and hinted to difficulty with adherence to the schedule  She has upcoming appt with:  [facility]  I have reviewed her records in [place] link  Sochx: no smoking or drinking  Outpatient Encounter Prescriptions as of 01/26/2017  Medication Sig  . acetaminophen (TYLENOL) 500 MG tablet Take 500 mg by mouth every 6 (six) hours as needed for mild pain.   [name] albuterol (PROAIR HFA) 108 (90 Base) MCG/ACT inhaler Inhale 2 puffs into the lungs every 4 (four) hours as needed for wheezing or shortness of breath.  [name] albuterol (PROVENTIL) (2.5 MG/3ML) 0.083% nebulizer solution Take 3 mLs (2.5 mg total) by nebulization every 6 (six) hours as needed for wheezing or shortness of breath.  [name] azithromycin (ZITHROMAX Z-PAK) 250 MG tablet 2 TABLETS ON DAY ONE AND THEN 1 DAILY UNTIL FINISHED (Patient taking differently: Take 250-500 mg by mouth daily. 2 TABLETS ON DAY ONE AND THEN 1 DAILY UNTIL FINISHED)  . carvedilol (COREG) 25 MG tablet Take 1 tablet (25 mg  total) by mouth daily.  . citalopram (CELEXA) 10 MG tablet Take 10 mg by mouth daily.  . cyanocobalamin (,VITAMIN B-12,) 1000 MCG/ML injection Inject 1 mL (1,000 mcg total) into the muscle every 30 (thirty) days.  . DESCOVY 200-25 MG tablet TAKE 1 TABLET BY MOUTH DAILY  . fluconazole (DIFLUCAN) 150 MG tablet Take once and then 72 hours later. (Patient taking differently: 150 mg once. Take once and then 72 hours later.)  . fluticasone (FLONASE) 50 MCG/ACT nasal spray Place 2 sprays into both nostrils daily. (Patient taking differently: Place 2 sprays into both nostrils daily as needed for allergies. )  . loratadine (CLARITIN) 10 MG tablet Take 1 tablet (10 mg total) by mouth daily as needed for allergies.  [name] losartan (COZAAR) 100 MG tablet Take 100 mg by mouth daily. hypertension  . losartan-hydrochlorothiazide (HYZAAR) 100-25 MG tablet Take 1 tablet by mouth daily. (Patient taking differently: Take 1 tablet by mouth daily as needed (If Blood pressure is over 160). )  . Multiple Vitamin (MULTIVITAMIN WITH MINERALS) TABS tablet Take 1 tablet by mouth daily. (Patient taking differently: Take 1 tablet by mouth every other day. )  . NIFEdipine (PROCARDIA XL/ADALAT-CC) 60 MG 24 hr tablet Take 1 tablet (60 mg total) by mouth daily.  [name] omeprazole (PRILOSEC) 40 MG capsule Take 1 capsule (40 mg total) by mouth daily before breakfast.  . OXYGEN Inhale 2.5-3 L into the lungs at bedtime  as needed (Shortness of Breath). 2.5 to 3 liters at night and as needed in the daytime.   . Tetrahydrozoline HCl (VISINE OP) Place 2 drops into both eyes as needed (for dry eyes).  [name] TIVICAY 50 MG tablet TAKE 1 TABLET BY MOUTH DAILY  . valACYclovir (VALTREX) 1000 MG tablet Take 1 tablet (1,000 mg total) by mouth 3 (three) times daily. (Patient taking differently: Take 1,000 mg by mouth daily. )   No facility-administered encounter medications on file as of 01/26/2017.      Patient Active Problem List   Diagnosis Date Noted  .  Morbid obesity with BMI of 40.0-44.9, adult ([place]) 01/13/2017  . On home oxygen therapy 01/13/2017  . Ascending aorta dilatation (HCC) 01/07/2017  . Low back pain radiating to right lower extremity 12/02/2016  . Bilateral renal cysts 12/02/2016  . Chronic kidney disease (CKD), stage III (moderate) 12/01/2016  . Generalized anxiety disorder 10/22/2016  . Hypersomnia 10/01/2016  . BPPV (benign paroxysmal positional vertigo) 09/18/2016  . Nocturnal hypoxemia 05/13/2016  . Cervical radiculopathy 04/14/2016  . GERD (gastroesophageal reflux disease) 01/02/2016  . Exertional dyspnea 12/04/2015  . Vitamin B12 deficiency 10/02/2015  . Inappropriate diet and eating habits 08/15/2015  . Opacity noted on imaging study   . Dyspnea 07/09/2015  . Asthma, moderate persistent 07/09/2015  . HIV disease ([place]) 07/09/2015  . Essential hypertension 07/09/2015  . Mild diastolic dysfunction 35/00/9381     Health Maintenance Due  Topic Date Due  . TETANUS/TDAP  03/03/1973  . COLONOSCOPY  03/03/2004     Review of Systems Per hpi, otherwise 12 point ros is negative Physical Exam   BP (!) 172/118   Pulse 98   Temp 98.4 F (36.9 C) (Oral)   Ht 5' 3.5" (1.613 m)   Wt 227 lb (103 kg)   BMI 39.58 kg/m  Physical Exam  Constitutional:  oriented to person, place, and time. appears well-developed and well-nourished. No distress.  HENT: [place]/AT, PERRLA, no scleral icterus Mouth/Throat: Oropharynx is clear and moist. No oropharyngeal exudate.  Cardiovascular: Normal rate, regular rhythm and normal heart sounds. Exam reveals no gallop and no friction rub.  No murmur heard.  Pulmonary/Chest: Effort normal and breath sounds normal. No respiratory distress.  has no wheezes.  Neck = supple, no nuchal rigidity Abdominal: Soft. Bowel sounds are normal.  exhibits no distension. There is no tenderness.  Lymphadenopathy: no cervical adenopathy. No axillary adenopathy Neurological: alert and oriented to person,  place, and time.  Skin: Skin is warm and dry. No rash noted. No erythema.  Psychiatric: a normal mood and affect.  behavior is normal.    Lab Results  Component Value Date   CD4TCELL 34 11/26/2016   Lab Results  Component Value Date   CD4TABS 730 11/26/2016   CD4TABS 640 08/12/2016   CD4TABS 510 03/31/2016   Lab Results  Component Value Date   HIV1RNAQUANT <20 DETECTED (A) 11/26/2016   No results found for: HEPBSAB Lab Results  Component Value Date   LABRPR Non Reactive 11/30/2016    CBC Lab Results  Component Value Date   WBC 6.8 01/25/2017   RBC 4.01 01/25/2017   HGB 12.1 01/25/2017   HCT 38.2 01/25/2017   PLT 189 01/25/2017   MCV 95.3 01/25/2017   MCH 30.2 01/25/2017   MCHC 31.7 01/25/2017   RDW 13.0 01/25/2017   LYMPHSABS 1.5 09/18/2016   MONOABS 0.3 09/18/2016   EOSABS 0.1 09/18/2016    BMET Lab Results  Component Value  Date   NA 138 01/25/2017   K 3.8 01/25/2017   CL 102 01/25/2017   CO2 29 01/25/2017   GLUCOSE 111 (H) 01/25/2017   BUN 11 01/25/2017   CREATININE 1.33 (H) 01/25/2017   CALCIUM 9.7 01/25/2017   GFRNONAA 42 (L) 01/25/2017   GFRAA 49 (L) 01/25/2017      Assessment and Plan  1) hiv disease - well controlled. Continue with tivicay/descovy 2) shingles - do valtrex 1gm tid x 7 days, then decrease to 1 tab per day for suppression til next visit 3) vaginal candidiasis - will give her fluconazole 150mg  to take as needed, 4) htn - continue on her home regimen, she appears worked up while at this visit that I suspect plays into her htn. Would like her to do BP diary and show to drs. [facility] to adjust meds 5) nonhealing wound to left ant tibia = will give her calcium alginate and dressing material. Avoid using alcohol  since it might be irritating the wound bed 6) asthma - continue on her home regimen, per dr de dios but being transitioned to dr Elsworth Soho. She had cxr that was without signs of pna or bronchitis 7) health maintenance - has  csy/egd in Aug by [facility] and mammo/bone scan in august as well 8) plan to see her next month to see how she is doing in order to help minimize trips to the ED 9) will get the clinic RN community outreach to go help with med management

## 2017-01-27 ENCOUNTER — Other Ambulatory Visit: Payer: Self-pay | Admitting: Cardiovascular Disease

## 2017-01-27 NOTE — Telephone Encounter (Signed)
Refill Request.  

## 2017-01-28 ENCOUNTER — Emergency Department (HOSPITAL_COMMUNITY): Payer: Medicare Other

## 2017-01-28 ENCOUNTER — Encounter (HOSPITAL_COMMUNITY): Payer: Self-pay | Admitting: Emergency Medicine

## 2017-01-28 ENCOUNTER — Observation Stay (HOSPITAL_COMMUNITY)
Admission: EM | Admit: 2017-01-28 | Discharge: 2017-01-29 | Disposition: A | Discharge status: Home / Self Care | Payer: Medicare Other | Attending: Student in an Organized Health Care Education/Training Program | Admitting: Student in an Organized Health Care Education/Training Program

## 2017-01-28 DIAGNOSIS — J4540 Moderate persistent asthma, uncomplicated: Secondary | ICD-10-CM | POA: Diagnosis not present

## 2017-01-28 DIAGNOSIS — N183 Chronic kidney disease, stage 3 unspecified: Secondary | ICD-10-CM | POA: Diagnosis present

## 2017-01-28 DIAGNOSIS — Z86711 Personal history of pulmonary embolism: Secondary | ICD-10-CM | POA: Insufficient documentation

## 2017-01-28 DIAGNOSIS — H04123 Dry eye syndrome of bilateral lacrimal glands: Secondary | ICD-10-CM | POA: Diagnosis not present

## 2017-01-28 DIAGNOSIS — J309 Allergic rhinitis, unspecified: Secondary | ICD-10-CM | POA: Insufficient documentation

## 2017-01-28 DIAGNOSIS — F410 Panic disorder [episodic paroxysmal anxiety] without agoraphobia: Secondary | ICD-10-CM | POA: Insufficient documentation

## 2017-01-28 DIAGNOSIS — M19042 Primary osteoarthritis, left hand: Secondary | ICD-10-CM | POA: Diagnosis not present

## 2017-01-28 DIAGNOSIS — R4189 Other symptoms and signs involving cognitive functions and awareness: Secondary | ICD-10-CM

## 2017-01-28 DIAGNOSIS — Z21 Asymptomatic human immunodeficiency virus [HIV] infection status: Secondary | ICD-10-CM | POA: Insufficient documentation

## 2017-01-28 DIAGNOSIS — Z9114 Patient's other noncompliance with medication regimen: Secondary | ICD-10-CM | POA: Diagnosis not present

## 2017-01-28 DIAGNOSIS — G4730 Sleep apnea, unspecified: Secondary | ICD-10-CM | POA: Diagnosis not present

## 2017-01-28 DIAGNOSIS — I2510 Atherosclerotic heart disease of native coronary artery without angina pectoris: Secondary | ICD-10-CM | POA: Diagnosis present

## 2017-01-28 DIAGNOSIS — I130 Hypertensive heart and chronic kidney disease with heart failure and stage 1 through stage 4 chronic kidney disease, or unspecified chronic kidney disease: Secondary | ICD-10-CM | POA: Insufficient documentation

## 2017-01-28 DIAGNOSIS — B20 Human immunodeficiency virus [HIV] disease: Secondary | ICD-10-CM | POA: Diagnosis present

## 2017-01-28 DIAGNOSIS — M19041 Primary osteoarthritis, right hand: Secondary | ICD-10-CM | POA: Diagnosis not present

## 2017-01-28 DIAGNOSIS — K219 Gastro-esophageal reflux disease without esophagitis: Secondary | ICD-10-CM | POA: Insufficient documentation

## 2017-01-28 DIAGNOSIS — R109 Unspecified abdominal pain: Secondary | ICD-10-CM

## 2017-01-28 DIAGNOSIS — Z8249 Family history of ischemic heart disease and other diseases of the circulatory system: Secondary | ICD-10-CM

## 2017-01-28 DIAGNOSIS — R0602 Shortness of breath: Secondary | ICD-10-CM | POA: Diagnosis not present

## 2017-01-28 DIAGNOSIS — N281 Cyst of kidney, acquired: Secondary | ICD-10-CM

## 2017-01-28 DIAGNOSIS — Z91048 Other nonmedicinal substance allergy status: Secondary | ICD-10-CM | POA: Insufficient documentation

## 2017-01-28 DIAGNOSIS — I5032 Chronic diastolic (congestive) heart failure: Secondary | ICD-10-CM

## 2017-01-28 DIAGNOSIS — I160 Hypertensive urgency: Secondary | ICD-10-CM | POA: Diagnosis not present

## 2017-01-28 DIAGNOSIS — Z7951 Long term (current) use of inhaled steroids: Secondary | ICD-10-CM

## 2017-01-28 DIAGNOSIS — J4551 Severe persistent asthma with (acute) exacerbation: Secondary | ICD-10-CM | POA: Diagnosis present

## 2017-01-28 DIAGNOSIS — Z9981 Dependence on supplemental oxygen: Secondary | ICD-10-CM

## 2017-01-28 DIAGNOSIS — Z881 Allergy status to other antibiotic agents status: Secondary | ICD-10-CM | POA: Diagnosis not present

## 2017-01-28 DIAGNOSIS — Z6841 Body Mass Index (BMI) 40.0 and over, adult: Secondary | ICD-10-CM | POA: Insufficient documentation

## 2017-01-28 DIAGNOSIS — J4530 Mild persistent asthma, uncomplicated: Secondary | ICD-10-CM | POA: Diagnosis not present

## 2017-01-28 DIAGNOSIS — E785 Hyperlipidemia, unspecified: Secondary | ICD-10-CM | POA: Diagnosis not present

## 2017-01-28 DIAGNOSIS — E538 Deficiency of other specified B group vitamins: Secondary | ICD-10-CM | POA: Diagnosis not present

## 2017-01-28 DIAGNOSIS — J449 Chronic obstructive pulmonary disease, unspecified: Secondary | ICD-10-CM

## 2017-01-28 DIAGNOSIS — Z9109 Other allergy status, other than to drugs and biological substances: Secondary | ICD-10-CM

## 2017-01-28 DIAGNOSIS — R069 Unspecified abnormalities of breathing: Secondary | ICD-10-CM | POA: Diagnosis not present

## 2017-01-28 DIAGNOSIS — J4541 Moderate persistent asthma with (acute) exacerbation: Secondary | ICD-10-CM | POA: Diagnosis present

## 2017-01-28 DIAGNOSIS — Z79899 Other long term (current) drug therapy: Secondary | ICD-10-CM | POA: Insufficient documentation

## 2017-01-28 DIAGNOSIS — I10 Essential (primary) hypertension: Secondary | ICD-10-CM | POA: Diagnosis present

## 2017-01-28 DIAGNOSIS — E669 Obesity, unspecified: Secondary | ICD-10-CM

## 2017-01-28 LAB — COMPREHENSIVE METABOLIC PANEL
ALT: 21 U/L (ref 14–54)
ANION GAP: 9 (ref 5–15)
AST: 24 U/L (ref 15–41)
Albumin: 3.8 g/dL (ref 3.5–5.0)
Alkaline Phosphatase: 61 U/L (ref 38–126)
BUN: 7 mg/dL (ref 6–20)
CHLORIDE: 101 mmol/L (ref 101–111)
CO2: 27 mmol/L (ref 22–32)
Calcium: 9.7 mg/dL (ref 8.9–10.3)
Creatinine, Ser: 1.27 mg/dL — ABNORMAL HIGH (ref 0.44–1.00)
GFR, EST AFRICAN AMERICAN: 51 mL/min — AB (ref >60)
GFR, EST NON AFRICAN AMERICAN: 44 mL/min — AB (ref >60)
Glucose, Bld: 127 mg/dL — ABNORMAL HIGH (ref 65–99)
POTASSIUM: 3.8 mmol/L (ref 3.5–5.1)
Sodium: 137 mmol/L (ref 135–145)
Total Bilirubin: 0.7 mg/dL (ref 0.3–1.2)
Total Protein: 6.7 g/dL (ref 6.5–8.1)

## 2017-01-28 LAB — I-STAT TROPONIN, ED: Troponin i, poc: 0.01 ng/mL (ref 0.00–0.08)

## 2017-01-28 LAB — CBC WITH DIFFERENTIAL/PLATELET
Basophils Absolute: 0 10*3/uL (ref 0.0–0.1)
Basophils Relative: 0 %
Eosinophils Absolute: 0.1 10*3/uL (ref 0.0–0.7)
Eosinophils Relative: 1 %
HCT: 37 % (ref 36.0–46.0)
HEMOGLOBIN: 11.9 g/dL — AB (ref 12.0–15.0)
LYMPHS ABS: 1.6 10*3/uL (ref 0.7–4.0)
LYMPHS PCT: 22 %
MCH: 30.5 pg (ref 26.0–34.0)
MCHC: 32.2 g/dL (ref 30.0–36.0)
MCV: 94.9 fL (ref 78.0–100.0)
Monocytes Absolute: 0.4 10*3/uL (ref 0.1–1.0)
Monocytes Relative: 5 %
NEUTROS ABS: 5.1 10*3/uL (ref 1.7–7.7)
NEUTROS PCT: 72 %
Platelets: 182 10*3/uL (ref 150–400)
RBC: 3.9 MIL/uL (ref 3.87–5.11)
RDW: 13.1 % (ref 11.5–15.5)
WBC: 7.2 10*3/uL (ref 4.0–10.5)

## 2017-01-28 LAB — BRAIN NATRIURETIC PEPTIDE: B Natriuretic Peptide: 71 pg/mL (ref 0.0–100.0)

## 2017-01-28 MED ORDER — IPRATROPIUM-ALBUTEROL 0.5-2.5 (3) MG/3ML IN SOLN
3.0000 mL | Freq: Four times a day (QID) | RESPIRATORY_TRACT | Status: DC
  Administered 2017-01-28 – 2017-01-29 (×4): 3 mL via RESPIRATORY_TRACT
  Filled 2017-01-28 (×4): qty 3

## 2017-01-28 MED ORDER — IOPAMIDOL (ISOVUE-370) INJECTION 76%
INTRAVENOUS | Status: AC
  Administered 2017-01-28: 100 mL
  Filled 2017-01-28: qty 100

## 2017-01-28 MED ORDER — CARVEDILOL 25 MG PO TABS
25.0000 mg | ORAL_TABLET | Freq: Two times a day (BID) | ORAL | Status: DC
  Administered 2017-01-28 – 2017-01-29 (×3): 25 mg via ORAL
  Filled 2017-01-28 (×3): qty 1
  Filled 2017-01-28: qty 2

## 2017-01-28 MED ORDER — ENOXAPARIN SODIUM 40 MG/0.4ML ~~LOC~~ SOLN: 40.0000 mg | SUBCUTANEOUS | Status: DC

## 2017-01-28 MED ORDER — ACETAMINOPHEN 325 MG PO TABS
650.0000 mg | ORAL_TABLET | Freq: Four times a day (QID) | ORAL | Status: DC | PRN
  Administered 2017-01-28 (×2): 650 mg via ORAL
  Filled 2017-01-28 (×2): qty 2

## 2017-01-28 MED ORDER — ENOXAPARIN SODIUM 60 MG/0.6ML ~~LOC~~ SOLN
50.0000 mg | SUBCUTANEOUS | Status: DC
  Administered 2017-01-28 – 2017-01-29 (×2): 50 mg via SUBCUTANEOUS
  Filled 2017-01-28 (×2): qty 0.6

## 2017-01-28 MED ORDER — FLUTICASONE PROPIONATE 50 MCG/ACT NA SUSP: 2.0000 | Freq: Every day | NASAL | Status: DC | PRN

## 2017-01-28 MED ORDER — DOLUTEGRAVIR SODIUM 50 MG PO TABS
50.0000 mg | ORAL_TABLET | Freq: Every day | ORAL | Status: DC
  Administered 2017-01-28 – 2017-01-29 (×2): 50 mg via ORAL
  Filled 2017-01-28 (×2): qty 1

## 2017-01-28 MED ORDER — ACETAMINOPHEN 650 MG RE SUPP: 650.0000 mg | Freq: Four times a day (QID) | RECTAL | Status: DC | PRN

## 2017-01-28 MED ORDER — MAGNESIUM SULFATE 2 GM/50ML IV SOLN
2.0000 g | Freq: Once | INTRAVENOUS | Status: AC
  Administered 2017-01-28: 2 g via INTRAVENOUS
  Filled 2017-01-28: qty 50

## 2017-01-28 MED ORDER — ALUM & MAG HYDROXIDE-SIMETH 200-200-20 MG/5ML PO SUSP
30.0000 mL | ORAL | Status: DC | PRN
Start: 2017-01-28 — End: 2017-01-29
  Administered 2017-01-28: 30 mL via ORAL
  Filled 2017-01-28: qty 30

## 2017-01-28 MED ORDER — TETRAHYDROZOLINE HCL 0.05 % OP SOLN
2.0000 [drp] | Freq: Every day | OPHTHALMIC | Status: DC | PRN
  Filled 2017-01-28: qty 15

## 2017-01-28 MED ORDER — EMTRICITABINE-TENOFOVIR AF 200-25 MG PO TABS
1.0000 | ORAL_TABLET | Freq: Every day | ORAL | Status: DC
  Administered 2017-01-28 – 2017-01-29 (×2): 1 via ORAL
  Filled 2017-01-28 (×2): qty 1

## 2017-01-28 MED ORDER — VALACYCLOVIR HCL 500 MG PO TABS
1000.0000 mg | ORAL_TABLET | Freq: Three times a day (TID) | ORAL | Status: DC
  Administered 2017-01-28 – 2017-01-29 (×5): 1000 mg via ORAL
  Filled 2017-01-28 (×5): qty 2

## 2017-01-28 MED ORDER — POLYETHYLENE GLYCOL 3350 17 G PO PACK: 17.0000 g | PACK | Freq: Every day | ORAL | Status: DC | PRN

## 2017-01-28 MED ORDER — ALBUTEROL SULFATE (2.5 MG/3ML) 0.083% IN NEBU
5.0000 mg | INHALATION_SOLUTION | Freq: Once | RESPIRATORY_TRACT | Status: AC
  Administered 2017-01-28: 5 mg via RESPIRATORY_TRACT
  Filled 2017-01-28: qty 6

## 2017-01-28 MED ORDER — NIFEDIPINE ER 60 MG PO TB24
60.0000 mg | ORAL_TABLET | Freq: Every day | ORAL | Status: DC
  Administered 2017-01-28 – 2017-01-29 (×2): 60 mg via ORAL
  Filled 2017-01-28 (×3): qty 1

## 2017-01-28 MED ORDER — LORATADINE 10 MG PO TABS: 10.0000 mg | ORAL_TABLET | Freq: Every day | ORAL | Status: DC | PRN

## 2017-01-28 MED ORDER — LOSARTAN POTASSIUM 50 MG PO TABS
100.0000 mg | ORAL_TABLET | Freq: Every day | ORAL | Status: DC
  Administered 2017-01-28 – 2017-01-29 (×2): 100 mg via ORAL
  Filled 2017-01-28 (×3): qty 2

## 2017-01-28 NOTE — H&P (Signed)
Date: 01/28/2017               Patient Name:  Shannon [name] MRN: [id number]  DOB: 10/22/1955 Age / Sex: 61 y.o., female   PCP: [name], MD         Medical Service: Internal Medicine Teaching Service         Attending Physician: Dr. Veryl Speak, MD;[place]*    First Contact: Dr. Heber [place] Pager: [phone]  Second Contact: Dr. Posey Pronto Pager: [phone]       After Hours (After 5p/  First Contact Pager: [phone]  weekends / holidays): Second Contact Pager: [phone]   Chief Complaint: "feeling like I'm about to fall out"  History of Present Illness: Shannon [name] is a 61 year old woman with hypertension, well controlled HIV, asthma/COPD on home oxygen, morbid obesity, coronary artery disease who presented to the ED overnight after "feeling like I'm about to fall out."  Shannon [name] was seen in ED on 01/25/17 and found to have elevated blood pressure 185/119. As Shannon [name] was felt to be low-risk for ACS given no prior cardiac history, Shannon [name] was discharged home with a recommendation to take carvedilol 25 mg twice daily instead of once daily as Shannon [name] was originally taking it. Shannon [name] was seen by [name] [ID] the following day and found to have BP 172/118 and was recommended to keep diary and readdress with Dr. Daryll Drown and Dr. Oval Linsey. Shannon [name] continued to feel unwell, and yesterday, Shannon [name] felt nauseous, dizziness, and squeezing chest pain in her substernal area while waiting for the [employer] bus. Upon taking her blood pressure at home, Shannon [name] was concerned to find it elevated up to the 200s, so Shannon [name] called EMS. Her normal blood pressure medications include carvedilol 25 mg twice daily, losartan 100 mg daily, nifedipine XL 60 mg though yesterday Shannon [name] took two doses of losartan/HCTZ 100/25 mg yesterday which Shannon [name] has been instructed by her cardiologist to take if her blood pressure starts running high. Unfortunately, Shannon [name] has been out of her nifedipine XL over the last 3 days. On my assessment, Shannon [name] denies any headache, chest pain, difficulty  breathing, changes in her vision.   Shannon [name] is on chronic oxygen therapy though has been told it's for her asthma and is concerned Shannon [name] may become dependent on it indefinitely so tries to use it only when Shannon [name] exerts herself, like going up and down the steps in her town home, which has been particularly worse with the A/C not functioning. Shannon [name] was found to have nocturnal hypoxemia without apnea on sleep study 05/13/16. Shannon [name] denies any ongoing alcohol, tobacco, illicit drug use.  In the ED, CTA was negative for PE and CT renal study was ordered given her history of renal cysts which were found to be stable without findings of obstruction or stones. Her initial electrolytes were within normal limits and her creatinine 1.3 [baseline 1.1-1.4]. Initial troponin resulted 0.01, and BNP resulted 71. Shannon [name] received magnesium 2g IV.   Meds:  Current Meds  Medication Sig  . acetaminophen (TYLENOL) 500 MG tablet Take 500 mg by mouth every 6 (six) hours as needed for mild pain.   [name] albuterol (PROAIR HFA) 108 (90 Base) MCG/ACT inhaler Inhale 2 puffs into the lungs every 4 (four) hours as needed for wheezing or shortness of breath.  [name] albuterol (PROVENTIL) (2.5 MG/3ML) 0.083% nebulizer solution Take 3 mLs (2.5 mg total) by nebulization every 6 (six) hours as needed for wheezing or shortness of breath.  . carvedilol (COREG) 25  MG tablet Take 1 tablet (25 mg total) by mouth daily.  . citalopram (CELEXA) 10 MG tablet Take 10 mg by mouth daily.  . cyanocobalamin (,VITAMIN B-12,) 1000 MCG/ML injection Inject 1 mL (1,000 mcg total) into the muscle every 30 (thirty) days.  . DESCOVY 200-25 MG tablet TAKE 1 TABLET BY MOUTH DAILY  . fluconazole (DIFLUCAN) 150 MG tablet Take 150 mg by mouth every 3 (three) days.  . fluticasone (FLONASE) 50 MCG/ACT nasal spray Place 2 sprays into both nostrils daily. (Patient taking differently: Place 2 sprays into both nostrils daily as needed for allergies. )  . loratadine (CLARITIN) 10 MG tablet  Take 1 tablet (10 mg total) by mouth daily as needed for allergies.  [name] losartan (COZAAR) 100 MG tablet Take 100 mg by mouth daily. hypertension  . losartan-hydrochlorothiazide (HYZAAR) 100-25 MG tablet Take 1 tablet by mouth daily. (Patient taking differently: Take 1 tablet by mouth daily as needed (If Blood pressure is over 160). )  . Multiple Vitamin (MULTIVITAMIN WITH MINERALS) TABS tablet Take 1 tablet by mouth daily. (Patient taking differently: Take 1 tablet by mouth every other day. )  . NIFEdipine (PROCARDIA XL/ADALAT-CC) 60 MG 24 hr tablet Take 1 tablet (60 mg total) by mouth daily.  . OXYGEN Inhale 2.5-3 L into the lungs at bedtime as needed (Shortness of Breath). 2.5 to 3 liters at night and as needed in the daytime.   . Tetrahydrozoline HCl (VISINE OP) Place 2 drops into both eyes daily as needed (for dry eyes).   [name] TIVICAY 50 MG tablet TAKE 1 TABLET BY MOUTH DAILY  . valACYclovir (VALTREX) 1000 MG tablet Take 1 tablet (1,000 mg total) by mouth 3 (three) times daily. (Patient taking differently: Take 1,000 mg by mouth daily. )     Allergies: Allergies as of 01/28/2017 - Review Complete 01/28/2017  Allergen Reaction Noted  . Tree extract Swelling and Other (See Comments) 04/13/2016  . Ciprofloxacin Hives 07/09/2015   Past Medical History:  Diagnosis Date  . Anemia   . Anxiety    HX PANIC ATTACKS  . Arthritis    "starting to; in my hands" (07/09/2015)  . Chronic asthma with acute exacerbation    "I have chronic asthma all the time; sometimes exacerbations" (07/09/2015)  . Chronic lower back pain   . Cyst of right kidney    "3 of them; dx'd in ~ 01/2015"  . GERD (gastroesophageal reflux disease)   . Heart murmur   . History of blood transfusion    "related to my brain surgery I think"  . History of pulmonary embolism 07/09/2015  . HIV antibody positive ([place])   . HIV disease ([place])   . Hyperlipidemia   . Hypertension   . Lipodystrophy   . Pneumonia 07/09/2015  .  Shingles   . Sleep apnea    "never completed part 2 of study; never wore mask" (07/09/2015)    Family History: Two sisters and a brother with heart murmur.  Social History: Originally from [place] but lives alone. Taking classes for a public health administration degree.   Review of Systems: A complete ROS was negative except as per HPI.   Physical Exam: Blood pressure (!) 152/92, pulse 84, temperature 98.3 F (36.8 C), temperature source Oral, resp. rate 18, SpO2 94 %. Physical Exam  Constitutional: Shannon [name] is oriented to person, place, and time. No distress.  HENT:  Head: Normocephalic and atraumatic.  Eyes: Conjunctivae are normal. No scleral icterus.  Cardiovascular: Normal rate  and intact distal pulses.   Systolic ejection murmur in the right upper sternal border.  Pulmonary/Chest: Effort normal. No respiratory distress.  Speaking full sentences  Abdominal: Soft. Shannon [name] exhibits no distension.  Neurological: Shannon [name] is alert and oriented to person, place, and time.  Skin: Skin is warm and dry. Shannon [name] is not diaphoretic.  Psychiatric:  Anxious   EKG: I reviewed and compared with 01/25/17. Normal sinus rhythm and axis ST repolarization changes  Assessment & Plan by Problem: Principal Problem:   Hypertensive urgency Active Problems:   Asthma, moderate persistent   HIV disease (HCC)   Essential hypertension   Mild diastolic dysfunction   Chronic kidney disease (CKD), stage III (moderate)   Bilateral renal cysts   Morbid obesity with BMI of 40.0-44.9, adult ([place])  Shannon [name] is a 61 year old woman with hypertension, well controlled HIV, asthma/COPD on home oxygen, morbid obesity, coronary artery disease hospitalized for hypertensive urgency.  Hypertensive urgency: Secondary to non-adherence to nifedipine and reassuring that her symptoms have resolved today without evidence of end-organ damage given stable EKG findings, negative troponin. Renovascular disease is less likely given that  Shannon [name] has been on ACE/ARB therapy for some time which has not led to acute decompensated blood pressure but will be monitored while Shannon [name] is here. No hypokalemia or metabolic alkalosis to suggest hyperaldosteronism.  -Restarted nifedipine XL 60mg , carvedilol 25 mg twice daily, and losartan 100 mg -Counseled her on how to take blood pressure at home  Asthma/COPD: PFTs 12/04/15 FEV1 31%, FEV1/FVC 50% consistent with severe COPD. Unclear why Shannon [name] is not on an antimuscarinic agent at home.  -Continue Duonebs every 6 hours as needed.   Well controlled HIV: Viral load undetectable, CD4 730, April 2018.  -Continue dolutegravir and emtricitabine/tenofovir -Continue valacyclovir 1000 mg three times daily  Chronic diastolic CHF: As noted on her most recent echo 01/06/17 with EF 27-51%, grade 1 diastolic dysfunction, mild to moderate aortic regurgitation, biatrial enlargement. Consistent with CTA findings of elevated right filling pressures. Pulmonary artery pressure was unable to be assessed. -Continue assessing  Allergic rhinitis: Continue loratidine 10 mg and fluticasone nasal spray.  CKD Stage 3: Creatinine at baseline with GFR trending 40s-50s. No proteinuria evident on review of prior urinalyses. -Continue losartan as noted above -Recheck electrolytes tomorrow  #FEN:  -Diet: Regular  #DVT prophylaxis: Lovenox  #CODE STATUS: FULL CODE -Defer to sister Sharyn Lull [phone] if patients lacks decision-making capacity -Confirmed with patient on admission  Dispo: Admit patient to Observation with expected length of stay less than 2 midnights.  Signed: Riccardo Dubin, MD 01/28/2017, 9:19 AM  Pager: [phone]

## 2017-01-28 NOTE — Progress Notes (Signed)
Attempted to get report.

## 2017-01-28 NOTE — ED Triage Notes (Addendum)
Pt presents from home with [facility] for SOB with difficulty speaking between breaths and wheezing; pt reports taking mulitple neb tx at home without relief; EMS noted wheezing all lung fields; 10mg  albuterol and 0.5 atrovent and 125mg  solumedrol given by EMS; pt also concerned about R flank pain with known cyst to R kidney

## 2017-01-28 NOTE — ED Notes (Signed)
Pt O2 stats 88, pt placed on 2lpm O2 by N/C. O2 stats came up to 93%

## 2017-01-28 NOTE — ED Notes (Signed)
Attempted report 

## 2017-01-28 NOTE — Progress Notes (Signed)
Received report

## 2017-01-28 NOTE — Progress Notes (Signed)
Shannon [name] is a 61 y.o. female patient admitted from ED awake, alert - oriented  X 4 - no acute distress noted.  VSS - Blood pressure (!) 167/96, pulse 66, temperature 97.7 F (36.5 C), temperature source Oral, resp. rate 17, SpO2 93 %.    IV in place, occlusive dsg intact without redness.  Orientation to room, and floor completed with information packet given to patient/family.  Patient declined safety video at this time.  Admission INP armband ID verified with patient/family, and in place.   SR up x 2, fall assessment complete, with patient and family able to verbalize understanding of risk associated with falls, and verbalized understanding to call nsg before up out of bed.  Call light within reach, patient able to voice, and demonstrate understanding.  Skin, clean-dry- intact without evidence of bruising. Pt has a skin tear on her left shin.   No evidence of skin break down noted on exam.     Will cont to eval and treat per MD orders.  Celine Ahr, RN 01/28/2017 11:24 AM

## 2017-01-28 NOTE — ED Provider Notes (Signed)
[facility] DEPT Provider Note   CSN: [id number] Arrival date & time: 01/28/17  0341  By signing my name below, I, Collene Leyden, attest that this documentation has been prepared under the direction and in the presence of Veryl Speak, MD. Electronically Signed: Collene Leyden, Scribe. 01/28/17. 4:04 AM.  History   Chief Complaint Chief Complaint  Patient presents with  . Shortness of Breath  . Wheezing    HPI Comments: Shannon [name] is a 61 y.o. female with a history of asthma, HIV, GERD, pneumonia, and PE, who presents to the Emergency Department complaining of sudden-onset, intermittent shortness of breath that began several days ago. Patient states she has had upper respiratory infections for several months now that have never resolved. Patient was seen here two days ago for a hypertension, she was given a breathing treatment with significant relief. Patient states she is currently on home oxygen and has nebulizer treatment at home that has not helped with her shortness of breath. Patient reports associated chest pain, elevated blood pressure, and left side pain. Patient was given 10 mg of albuterol and 0.5 Atrovent while en route with some relief. Patient denies any fever, chills, nausea, vomiting, abdominal pain, flank pain, dysuria, or hematuria.   The history is provided by the patient. No language interpreter was used.    Past Medical History:  Diagnosis Date  . Anemia   . Anxiety    HX PANIC ATTACKS  . Arthritis    "starting to; in my hands" (07/09/2015)  . Chronic asthma with acute exacerbation    "I have chronic asthma all the time; sometimes exacerbations" (07/09/2015)  . Chronic lower back pain   . Cyst of right kidney    "3 of them; dx'd in ~ 01/2015"  . GERD (gastroesophageal reflux disease)   . Heart murmur   . History of blood transfusion    "related to my brain surgery I think"  . History of pulmonary embolism 07/09/2015  . HIV antibody positive ([place])   .  HIV disease ([place])   . Hyperlipidemia   . Hypertension   . Lipodystrophy   . Pneumonia 07/09/2015  . Shingles   . Sleep apnea    "never completed part 2 of study; never wore mask" (07/09/2015)    Patient Active Problem List   Diagnosis Date Noted  . Morbid obesity with BMI of 40.0-44.9, adult ([place]) 01/13/2017  . On home oxygen therapy 01/13/2017  . Ascending aorta dilatation (HCC) 01/07/2017  . Low back pain radiating to right lower extremity 12/02/2016  . Bilateral renal cysts 12/02/2016  . Chronic kidney disease (CKD), stage III (moderate) 12/01/2016  . Generalized anxiety disorder 10/22/2016  . Hypersomnia 10/01/2016  . BPPV (benign paroxysmal positional vertigo) 09/18/2016  . Nocturnal hypoxemia 05/13/2016  . Cervical radiculopathy 04/14/2016  . GERD (gastroesophageal reflux disease) 01/02/2016  . Exertional dyspnea 12/04/2015  . Vitamin B12 deficiency 10/02/2015  . Inappropriate diet and eating habits 08/15/2015  . Opacity noted on imaging study   . Dyspnea 07/09/2015  . Asthma, moderate persistent 07/09/2015  . HIV disease ([place]) 07/09/2015  . Essential hypertension 07/09/2015  . Mild diastolic dysfunction 65/78/4696    Past Surgical History:  Procedure Laterality Date  . ABDOMINAL HYSTERECTOMY     "robotic laparosopic"  . BRAIN SURGERY  1974   "brain tumor; benign; on top of my brain; got a plate in there"  . CARDIAC CATHETERIZATION    . TONSILLECTOMY AND ADENOIDECTOMY      OB History  No data available       Home Medications    Prior to Admission medications   Medication Sig Start Date End Date Taking? Authorizing Provider  acetaminophen (TYLENOL) 500 MG tablet Take 500 mg by mouth every 6 (six) hours as needed for mild pain.     [provider]  albuterol (PROAIR HFA) 108 (90 Base) MCG/ACT inhaler Inhale 2 puffs into the lungs every 4 (four) hours as needed for wheezing or shortness of breath. 01/14/17   de Dios, [place] A, MD  albuterol  (PROVENTIL) (2.5 MG/3ML) 0.083% nebulizer solution Take 3 mLs (2.5 mg total) by nebulization every 6 (six) hours as needed for wheezing or shortness of breath. 01/14/17   de Dios, [place], MD  carvedilol (COREG) 25 MG tablet Take 1 tablet (25 mg total) by mouth daily. 01/22/17   Skeet Latch, MD  citalopram (CELEXA) 10 MG tablet Take 10 mg by mouth daily.    [provider]  cyanocobalamin (,VITAMIN B-12,) 1000 MCG/ML injection Inject 1 mL (1,000 mcg total) into the muscle every 30 (thirty) days. 01/20/17   [name], MD  DESCOVY 200-25 MG tablet TAKE 1 TABLET BY MOUTH DAILY 01/07/17   [name], MD  fluticasone [facility]) 50 MCG/ACT nasal spray Place 2 sprays into both nostrils daily. Patient not taking: Reported on 01/26/2017 10/01/16   Lucious Groves, DO  loratadine (CLARITIN) 10 MG tablet Take 1 tablet (10 mg total) by mouth daily as needed for allergies. 06/10/16 06/10/17  Alphonzo Grieve, MD  losartan (COZAAR) 100 MG tablet Take 100 mg by mouth daily. hypertension 01/20/17   [provider]  losartan-hydrochlorothiazide (HYZAAR) 100-25 MG tablet Take 1 tablet by mouth daily. Patient taking differently: Take 1 tablet by mouth daily as needed (If Blood pressure is over 160).  10/28/16   Skeet Latch, MD  Multiple Vitamin (MULTIVITAMIN WITH MINERALS) TABS tablet Take 1 tablet by mouth daily. Patient taking differently: Take 1 tablet by mouth every other day.  08/29/15   Rice, Resa Miner, MD  NIFEdipine (PROCARDIA XL/ADALAT-CC) 60 MG 24 hr tablet Take 1 tablet (60 mg total) by mouth daily. 01/27/17   Skeet Latch, MD  omeprazole (PRILOSEC) 40 MG capsule Take 1 capsule (40 mg total) by mouth daily before breakfast. Patient not taking: Reported on 01/26/2017 01/13/17   Gatha Mayer, MD  OXYGEN Inhale 2.5-3 L into the lungs at bedtime as needed (Shortness of Breath). 2.5 to 3 liters at night and as needed in the daytime.     [provider]    Tetrahydrozoline HCl (VISINE OP) Place 2 drops into both eyes as needed (for dry eyes).    [provider]  TIVICAY 50 MG tablet TAKE 1 TABLET BY MOUTH DAILY 01/07/17   [name], MD  valACYclovir (VALTREX) 1000 MG tablet Take 1 tablet (1,000 mg total) by mouth 3 (three) times daily. Patient taking differently: Take 1,000 mg by mouth daily.  01/22/17   Skeet Latch, MD    Family History Family History  Problem Relation Age of Onset  . Asthma Mother   . Heart failure Mother        cardiomyopathy  . Heart murmur Sister   . Heart murmur Brother   . Diabetes Sister   . Thyroid disease Sister   . Heart murmur Sister     Social History Social History  Substance Use Topics  . Smoking status: Never Smoker  . Smokeless tobacco: Never Used  .  Alcohol use 0.0 oz/week     Comment: Rarely.     Allergies   Tree extract and Ciprofloxacin   Review of Systems Review of Systems  Constitutional: Negative for chills and fever.  Respiratory: Positive for shortness of breath.   Cardiovascular: Positive for chest pain.  Gastrointestinal: Negative for abdominal pain, nausea and vomiting.  Genitourinary: Negative for dysuria, flank pain and hematuria.  Musculoskeletal: Positive for arthralgias (left side).  All other systems reviewed and are negative.    Physical Exam Updated Vital Signs BP (!) 190/120 (BP Location: Right Arm)   Pulse 74   Temp 98.3 F (36.8 C) (Oral)   Resp 20   SpO2 100%   Physical Exam  Constitutional: She is oriented to person, place, and time. She appears well-developed and well-nourished.  HENT:  Head: Normocephalic and atraumatic.  Mouth/Throat: Oropharynx is clear and moist.  Eyes: Pupils are equal, round, and reactive to light.  Neck: Normal range of motion. Neck supple.  Cardiovascular: Normal rate and regular rhythm.   No murmur heard. Pulmonary/Chest: Effort normal. No respiratory distress. She has wheezes. She has no rales.   Abdominal: Soft. Bowel sounds are normal. She exhibits no distension. There is no tenderness.  Musculoskeletal: Normal range of motion.  Neurological: She is alert and oriented to person, place, and time.  Skin: Skin is warm and dry.  Psychiatric: She has a normal mood and affect.  Nursing note and vitals reviewed.    ED Treatments / Results  DIAGNOSTIC STUDIES: Oxygen Saturation is 100% on RA, normal by my interpretation.    COORDINATION OF CARE: 4:03 AM Discussed treatment plan with pt at bedside and pt agreed to plan.  Labs (all labs ordered are listed, but only abnormal results are displayed) Labs Reviewed - No data to display  EKG ED ECG REPORT   Date: 01/28/2017  Rate: 74  Rhythm: normal sinus rhythm  QRS Axis: left  Intervals: normal  ST/T Wave abnormalities: normal  Conduction Disutrbances:none  Narrative Interpretation:   Old EKG Reviewed: none available  I have personally reviewed the EKG tracing and agree with the computerized printout as noted.   Radiology No results found.  Procedures Procedures (including critical care time)  Medications Ordered in ED Medications  albuterol (PROVENTIL) (2.5 MG/3ML) 0.083% nebulizer solution 5 mg (not administered)     Initial Impression / Assessment and Plan / ED Course  I have reviewed the triage vital signs and the nursing notes.  Pertinent labs & imaging results that were available during my care of the patient were reviewed by me and considered in my medical decision making (see chart for details).  Patient with history of HIV disease, asthma, anxiety, and pulmonary hypertension presenting with complaints of shortness of breath, elevated blood pressure. Her workup today reveals an unchanged EKG, negative troponin, normal BNP, and no evidence for pulmonary embolism on CT scan. Her breathing could be related to asthma, or possibly a worsening of her pulmonary hypertension as I have found no other obvious cause.  She does seem to feel somewhat better after receiving asthma medications by EMS and route to the hospital. As the patient is complaining of continued difficulty breathing, I will have her evaluated by the teaching service for possible admission for further workup and observation.  Final Clinical Impressions(s) / ED Diagnoses   Final diagnoses:  None    New Prescriptions New Prescriptions   No medications on file   I personally performed the services described in this  documentation, which was scribed in my presence. The recorded information has been reviewed and is accurate.        Veryl Speak, MD 01/28/17 [phone]

## 2017-01-28 NOTE — ED Notes (Signed)
Admitting MD at bedside.

## 2017-01-29 ENCOUNTER — Telehealth: Payer: Self-pay | Admitting: Internal Medicine

## 2017-01-29 DIAGNOSIS — I5032 Chronic diastolic (congestive) heart failure: Secondary | ICD-10-CM | POA: Diagnosis not present

## 2017-01-29 DIAGNOSIS — N183 Chronic kidney disease, stage 3 (moderate): Secondary | ICD-10-CM | POA: Diagnosis not present

## 2017-01-29 DIAGNOSIS — I160 Hypertensive urgency: Secondary | ICD-10-CM | POA: Diagnosis not present

## 2017-01-29 DIAGNOSIS — I130 Hypertensive heart and chronic kidney disease with heart failure and stage 1 through stage 4 chronic kidney disease, or unspecified chronic kidney disease: Secondary | ICD-10-CM | POA: Diagnosis not present

## 2017-01-29 LAB — URINALYSIS, ROUTINE W REFLEX MICROSCOPIC
BILIRUBIN URINE: NEGATIVE
GLUCOSE, UA: NEGATIVE mg/dL
HGB URINE DIPSTICK: NEGATIVE
KETONES UR: NEGATIVE mg/dL
Leukocytes, UA: NEGATIVE
Nitrite: NEGATIVE
PH: 5 (ref 5.0–8.0)
Protein, ur: NEGATIVE mg/dL
Specific Gravity, Urine: 1.01 (ref 1.005–1.030)

## 2017-01-29 LAB — BASIC METABOLIC PANEL
ANION GAP: 10 (ref 5–15)
BUN: 15 mg/dL (ref 6–20)
CHLORIDE: 99 mmol/L — AB (ref 101–111)
CO2: 28 mmol/L (ref 22–32)
CREATININE: 1.33 mg/dL — AB (ref 0.44–1.00)
Calcium: 10.2 mg/dL (ref 8.9–10.3)
GFR calc non Af Amer: 42 mL/min — ABNORMAL LOW (ref >60)
GFR, EST AFRICAN AMERICAN: 49 mL/min — AB (ref >60)
Glucose, Bld: 108 mg/dL — ABNORMAL HIGH (ref 65–99)
Potassium: 4.2 mmol/L (ref 3.5–5.1)
SODIUM: 137 mmol/L (ref 135–145)

## 2017-01-29 MED ORDER — CARVEDILOL 25 MG PO TABS: 25.0000 mg | ORAL_TABLET | Freq: Every day | ORAL | 0 refills | Status: DC

## 2017-01-29 MED ORDER — NIFEDIPINE ER 60 MG PO TB24: 60.0000 mg | ORAL_TABLET | Freq: Every day | ORAL | 1 refills | Status: DC

## 2017-01-29 MED ORDER — CARVEDILOL 25 MG PO TABS
25.0000 mg | ORAL_TABLET | Freq: Two times a day (BID) | ORAL | 0 refills | Status: DC
Start: 2017-01-29 — End: 2017-01-29

## 2017-01-29 MED ORDER — LOSARTAN POTASSIUM 100 MG PO TABS: 100.0000 mg | ORAL_TABLET | Freq: Every day | ORAL | 0 refills | Status: DC

## 2017-01-29 NOTE — Progress Notes (Signed)
   Subjective: Patient was evaluated this morning. She was in good spirits and denied shortness of breath, chest pain with this or dizziness.  Objective:  Vital signs in last 24 hours: Vitals:   01/29/17 0521 01/29/17 0610 01/29/17 0847 01/29/17 1345  BP: 128/70  114/71   Pulse: 78 87 71   Resp: 20 18    Temp: 97.7 F (36.5 C)     TempSrc:      SpO2: 95% 94%  91%   Physical Exam  Physical Exam  Constitutional: She is oriented to person, place, and time. No distress.  HENT:  Head: Normocephalic and atraumatic.  Eyes: Conjunctivae are normal. No scleral icterus.  Cardiovascular: Normal rate and intact distal pulses.   Systolic ejection murmur in the right upper sternal border.  Pulmonary/Chest: Effort normal. No respiratory distress.  Abdominal: Soft. She exhibits no distension.  Neurological: She is alert and oriented to person, place, and time.  Skin: Skin is warm and dry. She is not diaphoretic.  Psychiatric:    Assessment/Plan:  Principal Problem:   Hypertensive urgency Active Problems:   Asthma, moderate persistent   HIV disease (HCC)   Essential hypertension   Mild diastolic dysfunction   Chronic kidney disease (CKD), stage III (moderate)   Bilateral renal cysts   Morbid obesity with BMI of 40.0-44.9, adult (HCC)   CAD (coronary artery disease)  Severe Hypertension With the restart of oral home antihypertensive, patient's blood pressure has returned to a goal and is stable in the 120s/70s. Patient has close follow-up in the internal medicine clinic on June 13 at 10:15 AM. - refill home blood pressure medications.  Possible cognitive impairment MOCA of 25/30.  Patient shows tangential thinking and memory impairment. May need to repeat a head CT to ensure she has no new structural lesions from brain surgery in 1970s?[name]  Asthma/COPD: PFTs 12/04/15 FEV1 31%, FEV1/FVC 50% consistent with severe COPD.  -Continue Duonebs every 6 hours as needed.   Well controlled  HIV: Viral load undetectable, CD4 730, April 2018.  -Continue dolutegravir and emtricitabine/tenofovir   Chronic diastolic CHF No signs of volume overload on exam  CKD Stage 3 Stable  Allergic rhinitis Continue loratidine 10 mg and fluticasone nasal spray.  #FEN:  -Diet: Regular  #DVT prophylaxis: Lovenox  #CODE STATUS: FULL CODE -Defer to sister Sharyn Lull [phone] if patients lacks decision-making capacity -Confirmed with patient on admission  Dispo: Anticipated discharge today.   Kalman Shan Golovin, DO 01/29/2017, 3:37 PM Pager: [phone]

## 2017-01-29 NOTE — Progress Notes (Signed)
Shannon [name] to be D/C'd Home per MD order.  Discussed with the patient and all questions fully answered.  VSS, Skin clean, dry and intact without evidence of skin break down, no evidence of skin tears noted. IV catheter discontinued intact. Site without signs and symptoms of complications. Dressing and pressure applied.  An After Visit Summary was printed and given to the patient. Patient received prescription.  D/c education completed with patient/family including follow up instructions, medication list, d/c activities limitations if indicated, with other d/c instructions as indicated by MD - patient able to verbalize understanding, all questions fully answered.   Patient instructed to return to ED, call 911, or call MD for any changes in condition.   Patient escorted via [name], and D/C home via private auto.  [name] 01/29/2017 5:41 PM

## 2017-01-29 NOTE — Progress Notes (Signed)
Pt does not have an IV access. MD is aware.

## 2017-01-29 NOTE — Care Management Obs Status (Signed)
MEDICARE OBSERVATION STATUS NOTIFICATION   Patient Details  Name: HEELA HEISHMAN MRN: [id number] Date of Birth: [birth year]   Medicare Observation Status Notification Given:       Sharin Mons, RN 01/29/2017, 4:36 PM

## 2017-01-29 NOTE — Discharge Summary (Signed)
Name: Shannon [name] MRN: [id number] DOB: 1956-07-16 61 y.o. PCP: [name], MD  Date of Admission: 01/28/2017  3:41 AM Date of Discharge: 01/29/2017 Attending Physician: Axel Filler  Discharge Diagnosis: 1. Hypertensive urgency   Principal Problem:   Hypertensive urgency Active Problems:   Asthma, moderate persistent   HIV disease (HCC)   Essential hypertension   Mild diastolic dysfunction   Chronic kidney disease (CKD), stage III (moderate)   Bilateral renal cysts   Morbid obesity with BMI of 40.0-44.9, adult (HCC)   CAD (coronary artery disease)   Discharge Medications: Allergies as of 01/29/2017      Reactions   Tree Extract Swelling, Other (See Comments)   Swelling to eyes   Ciprofloxacin Hives      Medication List    STOP taking these medications   valACYclovir 1000 MG tablet Commonly known as:  VALTREX     TAKE these medications   acetaminophen 500 MG tablet Commonly known as:  TYLENOL Take 500 mg by mouth every 6 (six) hours as needed for mild pain.   albuterol 108 (90 Base) MCG/ACT inhaler Commonly known as:  PROAIR HFA Inhale 2 puffs into the lungs every 4 (four) hours as needed for wheezing or shortness of breath.   albuterol (2.5 MG/3ML) 0.083% nebulizer solution Commonly known as:  PROVENTIL Take 3 mLs (2.5 mg total) by nebulization every 6 (six) hours as needed for wheezing or shortness of breath.   carvedilol 25 MG tablet Commonly known as:  COREG Take 1 tablet (25 mg total) by mouth daily. What changed:  Another medication with the same name was added. Make sure you understand how and when to take each.   carvedilol 25 MG tablet Commonly known as:  COREG Take 1 tablet (25 mg total) by mouth daily. What changed:  You were already taking a medication with the same name, and this prescription was added. Make sure you understand how and when to take each.   citalopram 10 MG tablet Commonly known as:  CELEXA Take 10 mg by mouth  daily. Notes to patient:  01/29/17   cyanocobalamin 1000 MCG/ML injection Commonly known as:  (VITAMIN B-12) Inject 1 mL (1,000 mcg total) into the muscle every 30 (thirty) days.   DESCOVY 200-25 MG tablet Generic drug:  emtricitabine-tenofovir AF TAKE 1 TABLET BY MOUTH DAILY   fluconazole 150 MG tablet Commonly known as:  DIFLUCAN Take 150 mg by mouth every 3 (three) days.   fluticasone 50 MCG/ACT nasal spray Commonly known as:  FLONASE Place 2 sprays into both nostrils daily. What changed:  when to take this  reasons to take this   loratadine 10 MG tablet Commonly known as:  CLARITIN Take 1 tablet (10 mg total) by mouth daily as needed for allergies.   losartan 100 MG tablet Commonly known as:  COZAAR Take 100 mg by mouth daily. hypertension What changed:  Another medication with the same name was added. Make sure you understand how and when to take each.   losartan 100 MG tablet Commonly known as:  COZAAR Take 1 tablet (100 mg total) by mouth daily. What changed:  You were already taking a medication with the same name, and this prescription was added. Make sure you understand how and when to take each.   losartan-hydrochlorothiazide 100-25 MG tablet Commonly known as:  HYZAAR Take 1 tablet by mouth daily. What changed:  when to take this  reasons to take this   multivitamin with  minerals Tabs tablet Take 1 tablet by mouth daily. What changed:  when to take this   NIFEdipine 60 MG 24 hr tablet Commonly known as:  PROCARDIA XL/ADALAT-CC Take 1 tablet (60 mg total) by mouth daily. What changed:  Another medication with the same name was added. Make sure you understand how and when to take each.   NIFEdipine 60 MG 24 hr tablet Commonly known as:  PROCARDIA-XL/ADALAT CC Take 1 tablet (60 mg total) by mouth daily. What changed:  You were already taking a medication with the same name, and this prescription was added. Make sure you understand how and when to  take each.   omeprazole 40 MG capsule Commonly known as:  PRILOSEC Take 1 capsule (40 mg total) by mouth daily before breakfast.   OXYGEN Inhale 2.5-3 L into the lungs at bedtime as needed (Shortness of Breath). 2.5 to 3 liters at night and as needed in the daytime.   TIVICAY 50 MG tablet Generic drug:  dolutegravir TAKE 1 TABLET BY MOUTH DAILY   VISINE OP Place 2 drops into both eyes daily as needed (for dry eyes).       Disposition and follow-up:   Ms.Cathlene AIRLIE BLUMENBERG was discharged from [facility] in good condition.  At the hospital follow up visit please address:  1.  Medication regimen for blood pressure. Patient manges her blood pressure medications (losartan, nifedipine, carvedilol) based on her blood pressure readings.  Please assess how patient is taking medications. She was advised to take daily. 2. Home health services was ordered on this admission, mainly to check blood pressure and medication management.  Has home health services been started? 3 A  MOCA was conducted and patient scored 25/30.  Patient reports a history of benign brain tumor that was resected in 1970s. She may need a repeat head CT to ensure she has no new structural lesions.    2.  Labs / imaging needed at time of follow-up: Blood pressure recheck  3.  Pending labs/ test needing follow-up: none  Follow-up Appointments: [facility]. Go on 02/03/2017.   Specialty:  Internal Medicine Why:  10:15am Contact information: [address] [id number] [place]       [name], MD. Go on 03/10/2017.   Specialty:  Internal Medicine Why:  469 AM Contact information: [place] [address] [phone]        Care, [facility] Follow up.   Specialty:  Home Health Services Why:  home health services arranged , office to call and set up home visits Contact  information: [place] [address] [phone]           Hospital Course by problem list: Principal Problem:   Hypertensive urgency Active Problems:   Asthma, moderate persistent   HIV disease ([place])   Essential hypertension   Mild diastolic dysfunction   Chronic kidney disease (CKD), stage III (moderate)   Bilateral renal cysts   Morbid obesity with BMI of 40.0-44.9, adult ([place])   CAD (coronary artery disease)   Hypertensive urgency Patient presented with elevated blood pressures, 180s/100 and dizziness secondary to non-adherence to nifedipine.  Patient takes nifedipine, carvedilol and losartan at home but had ran out of nifedipine for several days prior to admission.  On admission there was no evidence of end-organ damage given stable EKG findings, negative troponin and lab work.  Patient's home blood pressure medications were restarted and symptoms and blood pressure improved and on day of discharge blood pressure was 120s/70s and symptoms had resolved.  Patient was told to take her medications as prescribed and to follow-up in the internal medicine clinic on 02/03/17. Patient was offered home health services to help with medication management and patient was agreeable.     Asthma/COPD PFTs 12/04/15 FEV1 31%, FEV1/FVC 50% consistent with severe COPD. Stable during admission, she was continued on home duonebs as needed.   Well controlled HIV Viral load undetectable, CD4 730, April 2018.  Continued home HIV medications  Cognitive impairment During admission patient presented with tangentile thinking, memory impairment and grandiosity.  A MOCA was conducted and patient scored 25/30.  Patient reports a history of benign brain tumor that was resected in 1970s. She may need a repeat head CT to ensure she has no new structural lesions.   Discharge Vitals:   BP (!) 153/87   Pulse 71   Temp 97.7 F (36.5 C)   Resp 18   SpO2 91%   Pertinent Labs, Studies,  and Procedures:  BMET and CBC  Discharge Instructions: Discharge Instructions    Diet - low sodium heart healthy    Complete by:  As directed    Discharge instructions    Complete by:  As directed    Ms. Guarnieri,  It was a pleasure taking care of you during your hospitalization.  Please take your blood pressure medications everyday.  Please follow up in the internal medicine clinic June 13th at 10:15am   Increase activity slowly    Complete by:  As directed       Signed: Valinda Party, DO 02/01/2017, 8:04 AM   Pager: [phone]

## 2017-01-29 NOTE — Telephone Encounter (Signed)
WANTS DR. Lockhart FOR HIGH B/P

## 2017-02-01 NOTE — Telephone Encounter (Signed)
Thank you!  I saw her H&P.

## 2017-02-02 ENCOUNTER — Telehealth: Payer: Self-pay

## 2017-02-02 ENCOUNTER — Telehealth: Payer: Self-pay | Admitting: Dietician

## 2017-02-02 NOTE — Telephone Encounter (Signed)
Per Bo from [facility] they have went to patient's home on 2 occasions for start of care, one day patient stated she could not get down stairs to answer door, on the 2nd occasion she was dressed and leaving the house stated she could not do it today she had to many other appointments. Referral for home health was done while patient was in hospital. Please advise what you want home health to do at this point.

## 2017-02-02 NOTE — Telephone Encounter (Signed)
We wanted home health RN to assist with medication adherence.  If they could attempt one more time, that would be great.  If no success, would have our pharmacist reach out.   Thanks

## 2017-02-02 NOTE — Telephone Encounter (Signed)
Left voicemail message for patient hat I received a referral to see her. I can meet with her at 78 Am tomorrow. Asked her to call the office and let us know if she'd like to be put on my schedule.

## 2017-02-03 ENCOUNTER — Ambulatory Visit: Payer: Medicare Other

## 2017-02-05 ENCOUNTER — Ambulatory Visit: Payer: Medicare Other

## 2017-02-05 ENCOUNTER — Encounter: Payer: Medicare Other | Admitting: Dietician

## 2017-02-10 ENCOUNTER — Encounter: Payer: Self-pay | Admitting: Cardiovascular Disease

## 2017-02-10 ENCOUNTER — Ambulatory Visit (INDEPENDENT_AMBULATORY_CARE_PROVIDER_SITE_OTHER): Payer: Medicare Other | Admitting: Cardiovascular Disease

## 2017-02-10 VITALS — BP 136/90 | HR 79 | Ht 63.0 in | Wt 221.0 lb

## 2017-02-10 DIAGNOSIS — J189 Pneumonia, unspecified organism: Secondary | ICD-10-CM | POA: Diagnosis not present

## 2017-02-10 DIAGNOSIS — I10 Essential (primary) hypertension: Secondary | ICD-10-CM | POA: Diagnosis not present

## 2017-02-10 DIAGNOSIS — E7800 Pure hypercholesterolemia, unspecified: Secondary | ICD-10-CM

## 2017-02-10 DIAGNOSIS — R0602 Shortness of breath: Secondary | ICD-10-CM

## 2017-02-10 DIAGNOSIS — I350 Nonrheumatic aortic (valve) stenosis: Secondary | ICD-10-CM

## 2017-02-10 DIAGNOSIS — I7121 Aneurysm of the ascending aorta, without rupture: Secondary | ICD-10-CM

## 2017-02-10 DIAGNOSIS — I712 Thoracic aortic aneurysm, without rupture: Secondary | ICD-10-CM

## 2017-02-10 DIAGNOSIS — G4733 Obstructive sleep apnea (adult) (pediatric): Secondary | ICD-10-CM | POA: Diagnosis not present

## 2017-02-10 MED ORDER — NIFEDIPINE ER 90 MG PO TB24: 90.0000 mg | ORAL_TABLET | Freq: Every day | ORAL | 5 refills | Status: DC

## 2017-02-10 NOTE — Patient Instructions (Addendum)
Medication Instructions:  INCREASE YOUR NIFEDIPINE TO 90 MG DAILY   Labwork: NONE  Testing/Procedures: NONE  Follow-Up: Your physician recommends that you schedule a follow-up appointment in: 1 MONTH OV  If you need a refill on your cardiac medications before your next appointment, please call your pharmacy.

## 2017-02-10 NOTE — Progress Notes (Signed)
Cardiology Office Note   Date:  02/10/2017   ID:  [name], DOB 10/21/1955, MRN [id number]  PCP:  Shannon Falcon, MD  Cardiologist:   Shannon Latch, MD  ID: Shannon Basques, MD  No chief complaint on file.    History of Present Illness: Shannon [name] is a 61 y.o. female with hypertension, asthma, non-obstructive CAD, OSA, prior PE, moderate pulmonary hypertension, mild ascending aorta aneurysm, and HIV (well-controlled)  who presents for follow up.  Shannon [name] was initially referred by her infectious disease doctor, Shannon Basques, MD, on 12/26/15. Shannon [name] was concerned about her lower extremity edema and shortness of breath.  She was started on lasix 20 mg every other day.  Shannon [name] noted shortness of breath since her hospitalization for pneumonia 06/2015.   She denied lower extremity edema or orthopnea but did report chest pain when laying down at night.  She was referred for an echo 01/2016 that revealed LVEF 60-65% awith grade 1 diastolic dysfunction and hypokinesis of the basal inferior wall.  She also had very mild aortic stenosis with a mean gradient of 9 mmHg and trivial AR.  PASP was 52 mmHg.  She had a Lexiscan Myoview at that time that was negative for ischemia.  Shannon [name] previously had a Lexiscan Cardiolite in September 2013 that revealed a medium, mild reversible defect in the anterior wall and normal systolic function. She subsequently underwent cardiac catheterization that revealed nonobstructive coronary disease. She had mild pulmonary hypertension and a normal cardiac output. Left ventriculography revealed mild to moderate aortic regurgitation. She had an echo May 2016 that revealed an ejection fraction of 01% and mild diastolic dysfunction. It also revealed calcification of the aortic valve, mild to moderate aortic regurgitation, and mild aortic stenosis. Peak velocity was 2.1 m/s. She had a 24-hour Holter in February 2015 that revealed rare PACs and PVCs.  Echo 01/06/17  revealed LVEF 60-65% with mild LVH and a mean aortic valve gradient of 8 mmHg.  Tricuspid regurgitation was not significant enough to assess pulmonary pressures  Her last appointment Shannon [name]'s blood pressure was poorly-controlled. It was noted that she was only taking carvedilol once daily and is only taking half the recommended dose. She agreed to start taking 25 mg twice daily.  After her last appointment Shannon [name] was admitted to the hospital 01/25/17 with hypertensive emergency and chest pain. Her blood pressure was 215/100.  Her blood pressure in the ED was 185/119. She was again seen in the ED 01/28/17 with shortness of breath and wheezing. Her blood pressure at that time was 190/120. It was noted that she hasn't been taking her medication as prescribed.  HCTZ was discontinued.   She reports that since leaving the hospital she has been taking all medications as prescribed and she understands the seriousness of her disease.   her blood pressure at home has been in the 751W to 258N systolic. She's been very stressed lately because the air conditioning was out in her townhouse for the last 2 weeks. She's had a flare of her respiratory disease. Fortunately she had supplemental oxygen in the house and her conditioning is back on. She complains of sinus congestion and recurrent sinusitis she denies lower extremity edema, orthopnea, or PND. Mucinex is helping to break up her congestion.  She denies fever or chills.     Past Medical History:  Diagnosis Date  . Anemia   . Anxiety    HX PANIC ATTACKS  .  Arthritis    "starting to; in my hands" (07/09/2015)  . Chronic asthma with acute exacerbation    "I have chronic asthma all the time; sometimes exacerbations" (07/09/2015)  . Chronic lower back pain   . Cyst of right kidney    "3 of them; dx'd in ~ 01/2015"  . GERD (gastroesophageal reflux disease)   . Heart murmur   . History of blood transfusion    "related to my brain surgery I think"  . History of  pulmonary embolism 07/09/2015  . HIV antibody positive ([place])   . HIV disease ([place])   . Hyperlipidemia   . Hypertension   . Lipodystrophy   . Pneumonia 07/09/2015  . Shingles   . Sleep apnea    "never completed part 2 of study; never wore mask" (07/09/2015)    Past Surgical History:  Procedure Laterality Date  . ABDOMINAL HYSTERECTOMY     "robotic laparosopic"  . BRAIN SURGERY  1974   "brain tumor; benign; on top of my brain; got a plate in there"  . CARDIAC CATHETERIZATION    . TONSILLECTOMY AND ADENOIDECTOMY       Current Outpatient Prescriptions  Medication Sig Dispense Refill  . acetaminophen (TYLENOL) 500 MG tablet Take 500 mg by mouth every 6 (six) hours as needed for mild pain.     [name] albuterol (PROAIR HFA) 108 (90 Base) MCG/ACT inhaler Inhale 2 puffs into the lungs every 4 (four) hours as needed for wheezing or shortness of breath. 6.7 g 3  . albuterol (PROVENTIL) (2.5 MG/3ML) 0.083% nebulizer solution Take 3 mLs (2.5 mg total) by nebulization every 6 (six) hours as needed for wheezing or shortness of breath. 75 mL 3  . carvedilol (COREG) 25 MG tablet Take 1 tablet (25 mg total) by mouth daily. 30 tablet 0  . citalopram (CELEXA) 10 MG tablet Take 10 mg by mouth daily.    . cyanocobalamin (,VITAMIN B-12,) 1000 MCG/ML injection Inject 1 mL (1,000 mcg total) into the muscle every 30 (thirty) days. 1 mL 6  . DESCOVY 200-25 MG tablet TAKE 1 TABLET BY MOUTH DAILY 30 tablet 5  . fluconazole (DIFLUCAN) 150 MG tablet Take 150 mg by mouth every 3 (three) days.    . fluticasone (FLONASE) 50 MCG/ACT nasal spray Place 2 sprays into both nostrils daily. (Patient taking differently: Place 2 sprays into both nostrils daily as needed for allergies. ) 16 g 5  . loratadine (CLARITIN) 10 MG tablet Take 1 tablet (10 mg total) by mouth daily as needed for allergies. 30 tablet 2  . losartan (COZAAR) 100 MG tablet Take 100 mg by mouth daily. hypertension  1  . Multiple Vitamin (MULTIVITAMIN WITH  MINERALS) TABS tablet Take 1 tablet by mouth daily. (Patient taking differently: Take 1 tablet by mouth every other day. ) 90 tablet   . NIFEdipine (ADALAT CC) 90 MG 24 hr tablet Take 1 tablet (90 mg total) by mouth daily. 30 tablet 5  . omeprazole (PRILOSEC) 40 MG capsule Take 1 capsule (40 mg total) by mouth daily before breakfast. (Patient not taking: Reported on 01/26/2017) 30 capsule 11  . OXYGEN Inhale 2.5-3 L into the lungs at bedtime as needed (Shortness of Breath). 2.5 to 3 liters at night and as needed in the daytime.     . Tetrahydrozoline HCl (VISINE OP) Place 2 drops into both eyes daily as needed (for dry eyes).     [name] TIVICAY 50 MG tablet TAKE 1 TABLET BY MOUTH DAILY 30  tablet 5   No current facility-administered medications for this visit.     Allergies:   Tree extract and Ciprofloxacin    Social History:  The patient  reports that she has never smoked. She has never used smokeless tobacco. She reports that she drinks alcohol. She reports that she does not use drugs.   Family History:  The patient's family history includes Asthma in her mother; Diabetes in her sister; Heart failure in her mother; Heart murmur in her brother, sister, and sister; Thyroid disease in her sister.    ROS:  Please see the history of present illness.   Otherwise, review of systems are positive for none   All other systems are reviewed and negative.   PHYSICAL EXAM: VS:  BP 136/90   Pulse 79   Ht 5\' 3"  (1.6 m)   Wt 100.2 kg (221 lb)   SpO2 97% Comment: 88 on room air. Pt patient on 3 L of 02  BMI 39.15 kg/m  , BMI Body mass index is 39.15 kg/m. GENERAL:  Well appearing.  Mild distress. HEENT:  Pupils equal round and reactive, fundi not visualized, oral mucosa unremarkable NECK:  No jugular venous distention, waveform within normal limits, carotid upstroke brisk and symmetric, no bruits LUNGS:  Diminished air movement.  No wheezes, rhonchi or crackles.   HEART:  RRR.  PMI not displaced or  sustained,S1 and S2 within normal limits, no S3, no S4, no clicks, no rubs, II/VI early-peaking systolic murmur at the right upper sternal border. I/IV diastolic murmur at the left upper sternal border.  ABD:  Central adiposity. Positive bowel sounds normal in frequency in pitch, no bruits, no rebound, no guarding, no midline pulsatile mass, no hepatomegaly, no splenomegaly EXT:  2 plus pulses throughout, no edema, no cyanosis no clubbing SKIN:  No rashes no nodules.  5-6 erythematous pustules on lateral L leg. NEURO:  Cranial nerves II through XII grossly intact, motor grossly intact throughout PSYCH:  Cognitively intact, oriented to person place and time.  Tangential.   EKG:  EKG is not ordered today. The ekg ordered 12/31/15 shows sinus rhythm. Rate 89 bpm. 2 PVCs. 10/28/16: Sinus rhythm. Rate 93 bpm  Coronary angiography 06/01/12: 10-20% OM, diffuse 20-30% LAD, 10-20% D1, 20% diffuse RCA RA 10, RV 39/12, RV EDP 12, capillary wedge pressure 19, PA 39/21, mean 27, LV 116/14, LVEDP 20, aorta 115/77 Cardiac output (Fick) 6.55, cardiac index 3.2 PVR 1.7 with units, SVR 12 with units. She P/QRS ratio 1 LVEF 80%. Aortic root mildly dilated. 1-2+ aortic regurgitation.  24-hour Holter 09/26/13: Sinus rhythm, rare PACs, rare PVCs  Echo 01/22/15: LVEF 70%. Mild LVH. Mild diastolic dysfunction. Normal RV function. Left atrium moderately enlarged. Right atrium mildly enlarged. Trace mitral regurgitation. Calcific aortic valve. Mild to moderate aortic regurgitation. Mild aortic stenosis. Peak velocity 2.1 m/s trace tricuspid regurgitation    Lexiscan Myoview 01/2016:   The left ventricular ejection fraction is normal (55-65%). The EF is 60% visually. The computer generated EF is not calculated correctly and is therefore not reported.  There was no ST segment deviation noted during stress.  The study is normal. no ischemia . no infarction   Echo 01/29/16: Study Conclusions  - Left ventricle: The  cavity size was normal. Wall thickness was   increased in a pattern of moderate LVH. Systolic function was   normal. The estimated ejection fraction was in the range of 60%   to 65%. Basal inferior hypokinesis. Doppler parameters are  consistent with abnormal left ventricular relaxation (grade 1   diastolic dysfunction). - Aortic valve: Trileaflet; moderately calcified leaflets. There   was very mild stenosis. There was mild regurgitation. Mean   gradient (S): 9 mm Hg. Valve area (VTI): 1.68 cm^2. - Mitral valve: There was trivial regurgitation. - Left atrium: The atrium was mildly dilated. - Right ventricle: The cavity size was normal. Systolic function   was normal. - Tricuspid valve: Peak RV-RA gradient (S): 44 mm Hg. - Pulmonary arteries: PA peak pressure: 52 mm Hg (S). - Systemic veins: IVC measured 2.3 cm with > 50% respirophasic   variation, suggesting RA pressure 8 mmHg.  Impressions:  - Normal LV size with moderate LV hypertrophy. EF 60-65%. Basal   inferior hypokinesis. Normal RV size and systolic function. Very   mild aortic stenosis, mild aortic insufficieny. Moderate   pulmonary hypertension.   Recent Labs: 01/28/2017: ALT 21; B Natriuretic Peptide 71.0; Hemoglobin 11.9; Platelets 182 01/29/2017: BUN 15; Creatinine, Ser 1.33; Potassium 4.2; Sodium 137    Lipid Panel    Component Value Date/Time   CHOL 217 (H) 11/26/2016 1618   TRIG 88 11/26/2016 1618   HDL 67 11/26/2016 1618   CHOLHDL 3.2 11/26/2016 1618   VLDL 18 11/26/2016 1618   LDLCALC 132 (H) 11/26/2016 1618      Wt Readings from Last 3 Encounters:  02/10/17 100.2 kg (221 lb)  01/26/17 103 kg (227 lb)  01/22/17 104.2 kg (229 lb 12.8 oz)      ASSESSMENT AND PLAN:  # Hypertension:  BP is much better controlled.  It seems that she hadn't been taking medication as prescribed.  Her adherence has improved significantly.  BP remains above goal.  Increase nifedipine to 90 mg daily.  Continue carvedilol  and losartan.  Follow up in one month.   # Sinus congestion: Deferred this conversation for her to have with her PCP.  # Aortic regurgitation # Aortic stenosis: Shannon [name] has very mild aortic stenosis (mean gradient 9 mmHg) and mild aortic regurgitation.  Continue to monitor.    # Hyperlipidemia: Will address at follow up.  # Ascending aorta aneurysm: 4.0 on CT 12/2016.  Will re-assess 12/2017.  Current medicines are reviewed at length with the patient today.  The patient does not have concerns regarding medicines.  The following changes have been made:  Increase nifedipine to 90 mg daily.   Labs/ tests ordered today include:   No orders of the defined types were placed in this encounter.    Disposition:   FU with Tiquan Bouch C. Oval Linsey, MD, [facility] in 1 month.  This note was written with the assistance of speech recognition software.  Please excuse any transcriptional errors.  Signed, Yunis Voorheis C. Oval Linsey, MD, [facility]  02/10/2017 10:17 AM    [place]

## 2017-02-11 ENCOUNTER — Ambulatory Visit: Payer: Medicare Other | Admitting: Dietician

## 2017-02-11 ENCOUNTER — Ambulatory Visit (INDEPENDENT_AMBULATORY_CARE_PROVIDER_SITE_OTHER): Payer: Medicare Other | Admitting: Internal Medicine

## 2017-02-11 ENCOUNTER — Encounter: Payer: Self-pay | Admitting: Internal Medicine

## 2017-02-11 VITALS — BP 156/77 | HR 87 | Temp 98.1°F | Wt 224.0 lb

## 2017-02-11 DIAGNOSIS — I10 Essential (primary) hypertension: Secondary | ICD-10-CM

## 2017-02-11 DIAGNOSIS — Z9981 Dependence on supplemental oxygen: Secondary | ICD-10-CM

## 2017-02-11 DIAGNOSIS — Z79899 Other long term (current) drug therapy: Secondary | ICD-10-CM

## 2017-02-11 DIAGNOSIS — J4540 Moderate persistent asthma, uncomplicated: Secondary | ICD-10-CM

## 2017-02-11 DIAGNOSIS — Z7951 Long term (current) use of inhaled steroids: Secondary | ICD-10-CM

## 2017-02-11 MED ORDER — ALBUTEROL SULFATE (2.5 MG/3ML) 0.083% IN NEBU: 2.5000 mg | INHALATION_SOLUTION | Freq: Four times a day (QID) | RESPIRATORY_TRACT | 3 refills | Status: DC | PRN

## 2017-02-11 MED ORDER — METHYLPREDNISOLONE SODIUM SUCC 125 MG IJ SOLR
125.0000 mg | Freq: Once | INTRAMUSCULAR | Status: AC
  Administered 2017-02-11: 125 mg via INTRAMUSCULAR

## 2017-02-11 MED ORDER — BUDESONIDE-FORMOTEROL FUMARATE 80-4.5 MCG/ACT IN AERO: 2.0000 | INHALATION_SPRAY | Freq: Two times a day (BID) | RESPIRATORY_TRACT | 12 refills | Status: DC

## 2017-02-11 MED ORDER — AZITHROMYCIN 250 MG PO TABS: ORAL_TABLET | ORAL | 0 refills | Status: DC

## 2017-02-11 MED ORDER — PREDNISONE 20 MG PO TABS: 40.0000 mg | ORAL_TABLET | Freq: Every day | ORAL | 0 refills | Status: AC

## 2017-02-11 NOTE — Progress Notes (Signed)
Patient rescheduled this visit.

## 2017-02-11 NOTE — Patient Instructions (Addendum)
Thank you for visiting clinic today.  As we have discussed that your oxygen level is dropping, that can be dangerous when you are without oxygen as it can cause heart attack or stroke. According to our discussion you to understand that you'll need to go home straight and use your oxygen at 2 L continuously, 24 hours. We will try to arrange for portable tank, you might have to pay out of your pocket for a smaller tank. Your home health agency can work with you. Please seek immediate medical attention if you become short of breath, have chest pain or become dizzy. Please follow-up in the clinic early next week. Please bring your oxygen with you and we will reassess you.

## 2017-02-11 NOTE — Assessment & Plan Note (Signed)
BP Readings from Last 3 Encounters:  02/11/17 (!) 156/77  02/10/17 136/90  01/29/17 (!) 153/87   Her initial blood pressure was elevated, on recheck after oxygen supplement, her blood pressure improved to 129/77.  Her nifedipine was increased to 90 mg daily by her cardiologist yesterday. This morning she just took her nifedipine, none of the other medications were taken.  -Continue current management with nifedipine, Coreg and losartan.

## 2017-02-11 NOTE — Progress Notes (Signed)
SATURATION QUALIFICATIONS: (This note is used to comply with regulatory documentation for home oxygen)  Patient Saturations on Room Air at Rest = 84  Patient Saturations on Room Air while Ambulating =87  Patient Saturations on 2 Liters of oxygen while at rest = 99

## 2017-02-11 NOTE — Progress Notes (Signed)
CC: Worsening shortness of breath, increased in her cough with increased sputum production for 2 days.  HPI:  Shannon [name] is a 61 y.o. with past medical history as listed below came to the clinic for in hospital follow-up and complained of worsening shortness of breath, with increased in her cough and sputum production of greenish color for last 2 days. She denies any fever or chills, denies any chest pain. According to patient her air-conditioning was out for last 2 weeks, they repaired it yesterday.  Patient has a history of persistent asthma versus COPD. Her pulmonary function testing are compatible with moderately severe COPD. On initial arrival, she was very short of breath, unable to speak in full sentences and her oxygen saturation was 83%, her shortness of breath and hypoxia improved with 2 L of oxygen. On exam she was having tight chest. Her saturation improved to 99% with 2 L of oxygen, her blood pressure also improved from 427 systolic to 062 systolic.  She was given a shot of Solu-Medrol and DuoNeb treatment. Later patient was reassessed on room air, her saturation dropped to 88% at rest and to 86% with ambulation. She will need 2 L of oxygen all the time. She was offered hospital admission, she do not want to get admitted and insisted on going home. She has oxygen tank at home which she was using  just at night. We discussed in detail the risk of being hypoxic as that can cause heart attack or stroke too, she was advised to seek immediate medical attention as soon as she experienced worsening of shortness of breath, chest pain, any focal deficits or becomes dizzy.  She promised [place] that she will use her home oxygen 24 hour, a new order was placed for a portable tank to carry too.  Past Medical History:  Diagnosis Date  . Anemia   . Anxiety    HX PANIC ATTACKS  . Arthritis    "starting to; in my hands" (07/09/2015)  . Chronic asthma with acute exacerbation    "I have chronic  asthma all the time; sometimes exacerbations" (07/09/2015)  . Chronic lower back pain   . Cyst of right kidney    "3 of them; dx'd in ~ 01/2015"  . GERD (gastroesophageal reflux disease)   . Heart murmur   . History of blood transfusion    "related to my brain surgery I think"  . History of pulmonary embolism 07/09/2015  . HIV antibody positive ([place])   . HIV disease ([place])   . Hyperlipidemia   . Hypertension   . Lipodystrophy   . Pneumonia 07/09/2015  . Shingles   . Sleep apnea    "never completed part 2 of study; never wore mask" (07/09/2015)    Review of Systems:  As per HPI.  Physical Exam:  Vitals:   02/11/17 0857  BP: (!) 156/77  Pulse: 87  Temp: 98.1 F (36.7 C)  TempSrc: Oral  SpO2: (!) 83%  Weight: 224 lb (101.6 kg)    General: Vital signs reviewed.  Patient is well-developed and well-nourished, Was short of breath unable to speak in full sentences initially, improved with oxygen supplement and cooperative with exam.  Cardiovascular: RRR, S1 normal, S2 BJSEGB,1/5 systolic murmur at right upper sternal border. Pulmonary/Chest: Decreased air entry bilaterally. Abdominal: Soft, non-tender, non-distended, BS +, no masses, organomegaly, or guarding present.  Extremities: No lower extremity edema bilaterally,  pulses symmetric and intact bilaterally. No cyanosis or clubbing.   Assessment &  Plan:   See Encounters Tab for problem based charting.  Patient discussed with Dr. Evette Doffing.

## 2017-02-11 NOTE — Assessment & Plan Note (Addendum)
came to the clinic for in hospital follow-up and complained of worsening shortness of breath, with increased in her cough and sputum production of greenish color for last 2 days. She denies any fever or chills, denies any chest pain. According to patient her air-conditioning was out for last 2 weeks, they repaired it yesterday.  Patient has a history of persistent asthma versus COPD. Her pulmonary function testing are compatible with moderately severe COPD. On initial arrival, she was very short of breath, unable to speak in full sentences and her oxygen saturation was 83%, her shortness of breath and hypoxia improved with 2 L of oxygen. On exam she was having tight chest. Her saturation improved to 99% with 2 L of oxygen, her blood pressure also improved from 676 systolic to 720 systolic.  She was given a shot of Solu-Medrol and DuoNeb treatment. Later patient was reassessed on room air, her saturation dropped to 88% at rest and to 86% with ambulation. She will need 2 L of oxygen all the time. She was offered hospital admission, she do not want to get admitted and insisted on going home. She has oxygen tank at home which she was using just at night. We discussed in detail the risk of being hypoxic as that can cause heart attack or stroke too, she was advised to seek immediate medical attention as soon as she experienced worsening of shortness of breath, chest pain, any focal deficits or becomes dizzy.  She promised [place] that she will use her home oxygen 24 hour, a new order was placed for a portable tank to carry too.  -She was given prescription for prednisone 40 mg daily for 4 more days. -Z-Pak for 5 days. -Added Symbicort inhaler twice daily to her regimen. -Should follow up early next week for reassessment. -We contacted her home health agency to deliver portable oxygen tank, as she will need oxygen 24 hours at this time. Her needs can be reassessed after this acute exacerbation.

## 2017-02-11 NOTE — Progress Notes (Signed)
Internal Medicine Clinic Attending  Case discussed with Dr. Amin at the time of the visit.  We reviewed the resident's history and exam and pertinent patient test results.  I agree with the assessment, diagnosis, and plan of care documented in the resident's note.    

## 2017-02-11 NOTE — Addendum Note (Signed)
Addended by: Lalla Brothers T on: 02/11/2017 03:12 PM   Modules accepted: Level of Service

## 2017-02-16 ENCOUNTER — Ambulatory Visit: Payer: Medicare Other

## 2017-02-18 ENCOUNTER — Emergency Department (HOSPITAL_COMMUNITY)
Admission: EM | Admit: 2017-02-18 | Discharge: 2017-02-19 | Disposition: A | Discharge status: Home / Self Care | Payer: Medicare Other | Attending: Emergency Medicine | Admitting: Emergency Medicine

## 2017-02-18 ENCOUNTER — Encounter (HOSPITAL_COMMUNITY): Payer: Self-pay | Admitting: Emergency Medicine

## 2017-02-18 ENCOUNTER — Other Ambulatory Visit: Payer: Self-pay

## 2017-02-18 DIAGNOSIS — R000 Tachycardia, unspecified: Secondary | ICD-10-CM | POA: Diagnosis present

## 2017-02-18 DIAGNOSIS — N183 Chronic kidney disease, stage 3 (moderate): Secondary | ICD-10-CM | POA: Insufficient documentation

## 2017-02-18 DIAGNOSIS — I4892 Unspecified atrial flutter: Secondary | ICD-10-CM | POA: Diagnosis not present

## 2017-02-18 DIAGNOSIS — N289 Disorder of kidney and ureter, unspecified: Secondary | ICD-10-CM

## 2017-02-18 DIAGNOSIS — I480 Paroxysmal atrial fibrillation: Secondary | ICD-10-CM | POA: Diagnosis not present

## 2017-02-18 DIAGNOSIS — I2510 Atherosclerotic heart disease of native coronary artery without angina pectoris: Secondary | ICD-10-CM | POA: Insufficient documentation

## 2017-02-18 DIAGNOSIS — I129 Hypertensive chronic kidney disease with stage 1 through stage 4 chronic kidney disease, or unspecified chronic kidney disease: Secondary | ICD-10-CM | POA: Insufficient documentation

## 2017-02-18 DIAGNOSIS — Z79899 Other long term (current) drug therapy: Secondary | ICD-10-CM | POA: Insufficient documentation

## 2017-02-18 LAB — CBC
HCT: 43.5 % (ref 36.0–46.0)
HEMOGLOBIN: 13.8 g/dL (ref 12.0–15.0)
MCH: 30.9 pg (ref 26.0–34.0)
MCHC: 31.7 g/dL (ref 30.0–36.0)
MCV: 97.3 fL (ref 78.0–100.0)
Platelets: 217 10*3/uL (ref 150–400)
RBC: 4.47 MIL/uL (ref 3.87–5.11)
RDW: 13.1 % (ref 11.5–15.5)
WBC: 8.3 10*3/uL (ref 4.0–10.5)

## 2017-02-18 LAB — BASIC METABOLIC PANEL
ANION GAP: 8 (ref 5–15)
BUN: 16 mg/dL (ref 6–20)
CALCIUM: 10.1 mg/dL (ref 8.9–10.3)
CO2: 31 mmol/L (ref 22–32)
Chloride: 101 mmol/L (ref 101–111)
Creatinine, Ser: 1.31 mg/dL — ABNORMAL HIGH (ref 0.44–1.00)
GFR calc non Af Amer: 43 mL/min — ABNORMAL LOW (ref >60)
GFR, EST AFRICAN AMERICAN: 49 mL/min — AB (ref >60)
Glucose, Bld: 115 mg/dL — ABNORMAL HIGH (ref 65–99)
Potassium: 4 mmol/L (ref 3.5–5.1)
Sodium: 140 mmol/L (ref 135–145)

## 2017-02-18 LAB — I-STAT TROPONIN, ED: TROPONIN I, POC: 0.02 ng/mL (ref 0.00–0.08)

## 2017-02-18 MED ORDER — FLECAINIDE ACETATE 100 MG PO TABS
300.0000 mg | ORAL_TABLET | ORAL | Status: AC
  Administered 2017-02-19: 300 mg via ORAL
  Filled 2017-02-18: qty 3

## 2017-02-18 MED ORDER — DILTIAZEM HCL 100 MG IV SOLR
5.0000 mg/h | INTRAVENOUS | Status: DC
  Administered 2017-02-19: 5 mg/h via INTRAVENOUS
  Filled 2017-02-18: qty 100

## 2017-02-18 MED ORDER — APIXABAN 5 MG PO TABS
5.0000 mg | ORAL_TABLET | Freq: Once | ORAL | Status: AC
  Administered 2017-02-19: 5 mg via ORAL
  Filled 2017-02-18: qty 1

## 2017-02-18 MED ORDER — DILTIAZEM LOAD VIA INFUSION
20.0000 mg | Freq: Once | INTRAVENOUS | Status: AC
  Administered 2017-02-19: 20 mg via INTRAVENOUS
  Filled 2017-02-18: qty 20

## 2017-02-18 NOTE — ED Triage Notes (Signed)
Pt presents to ED for assessment of waking up this morning to SOB and heart racing.  Pt states she took all of her daily medicines tosay.  Pt states she noted her heart was racing tonight, and is not speaking in full sentences, HR 150+.

## 2017-02-18 NOTE — ED Notes (Signed)
Pt sts she uses O2 at home at night 2L.  96% on RA at this time.

## 2017-02-18 NOTE — ED Notes (Signed)
ED Provider at bedside. 

## 2017-02-18 NOTE — ED Provider Notes (Addendum)
[facility] DEPT Provider Note   CSN: [id number] Arrival date & time: 02/18/17  2229  By signing my name below, I, Theresia Bough, attest that this documentation has been prepared under the direction and in the presence of Delora Fuel, MD. Electronically Signed: Theresia Bough, ED Scribe. 02/18/17. 11:26 PM.  History   Chief Complaint Chief Complaint  Patient presents with  . Shortness of Breath  . Tachycardia   The history is provided by the patient. No language interpreter was used.   HPI Comments: Shannon [name] is a 61 y.o. female with a PMHx of multiple comorbidities, who presents to the Emergency Department complaining of constant, moderate increased heart palpitations onset this morning. No hx of similar symptoms. Pt reports associated chest pressure/tightness and SOB. No modifying factors. Pt takes medications for her HTN and states she has taken all of them today. No other complaints at this time.  Past Medical History:  Diagnosis Date  . Anemia   . Anxiety    HX PANIC ATTACKS  . Arthritis    "starting to; in my hands" (07/09/2015)  . Chronic asthma with acute exacerbation    "I have chronic asthma all the time; sometimes exacerbations" (07/09/2015)  . Chronic lower back pain   . Cyst of right kidney    "3 of them; dx'd in ~ 01/2015"  . GERD (gastroesophageal reflux disease)   . Heart murmur   . History of blood transfusion    "related to my brain surgery I think"  . History of pulmonary embolism 07/09/2015  . HIV antibody positive ([place])   . HIV disease ([place])   . Hyperlipidemia   . Hypertension   . Lipodystrophy   . Pneumonia 07/09/2015  . Shingles   . Sleep apnea    "never completed part 2 of study; never wore mask" (07/09/2015)    Patient Active Problem List   Diagnosis Date Noted  . CAD (coronary artery disease) 01/28/2017  . Morbid obesity with BMI of 40.0-44.9, adult ([place]) 01/13/2017  . On home oxygen therapy 01/13/2017  . Ascending aorta  dilatation (HCC) 01/07/2017  . Low back pain radiating to right lower extremity 12/02/2016  . Bilateral renal cysts 12/02/2016  . Chronic kidney disease (CKD), stage III (moderate) 12/01/2016  . Generalized anxiety disorder 10/22/2016  . Hypersomnia 10/01/2016  . BPPV (benign paroxysmal positional vertigo) 09/18/2016  . Nocturnal hypoxemia 05/13/2016  . Cervical radiculopathy 04/14/2016  . GERD (gastroesophageal reflux disease) 01/02/2016  . Exertional dyspnea 12/04/2015  . Vitamin B12 deficiency 10/02/2015  . Inappropriate diet and eating habits 08/15/2015  . Opacity noted on imaging study   . Dyspnea 07/09/2015  . Asthma, moderate persistent 07/09/2015  . HIV disease ([place]) 07/09/2015  . Essential hypertension 07/09/2015  . Mild diastolic dysfunction 09/38/1829    Past Surgical History:  Procedure Laterality Date  . ABDOMINAL HYSTERECTOMY     "robotic laparosopic"  . BRAIN SURGERY  1974   "brain tumor; benign; on top of my brain; got a plate in there"  . CARDIAC CATHETERIZATION    . TONSILLECTOMY AND ADENOIDECTOMY      OB History    No data available       Home Medications    Prior to Admission medications   Medication Sig Start Date End Date Taking? Authorizing Provider  acetaminophen (TYLENOL) 500 MG tablet Take 500 mg by mouth every 6 (six) hours as needed for mild pain.     [provider]  albuterol (PROAIR HFA) 108 (90  Base) MCG/ACT inhaler Inhale 2 puffs into the lungs every 4 (four) hours as needed for wheezing or shortness of breath. 01/14/17   de Dios, [place] A, MD  albuterol (PROVENTIL) (2.5 MG/3ML) 0.083% nebulizer solution Take 3 mLs (2.5 mg total) by nebulization every 6 (six) hours as needed for wheezing or shortness of breath. 02/11/17   Lorella Nimrod, MD  azithromycin (ZITHROMAX) 250 MG tablet Take 2 tablets on day 1, then 1 tablet daily for 4 more days. 02/11/17   Lorella Nimrod, MD  budesonide-formoterol (SYMBICORT) 80-4.5 MCG/ACT inhaler  Inhale 2 puffs into the lungs 2 (two) times daily. 02/11/17   Lorella Nimrod, MD  carvedilol (COREG) 25 MG tablet Take 1 tablet (25 mg total) by mouth daily. 01/29/17   Kalman Shan Ratliff, DO  citalopram (CELEXA) 10 MG tablet Take 10 mg by mouth daily.    [provider]  cyanocobalamin (,VITAMIN B-12,) 1000 MCG/ML injection Inject 1 mL (1,000 mcg total) into the muscle every 30 (thirty) days. 01/20/17   [name], MD  DESCOVY 200-25 MG tablet TAKE 1 TABLET BY MOUTH DAILY 01/07/17   [name], MD  fluconazole (DIFLUCAN) 150 MG tablet Take 150 mg by mouth every 3 (three) days.    [provider]  fluticasone (FLONASE) 50 MCG/ACT nasal spray Place 2 sprays into both nostrils daily. Patient taking differently: Place 2 sprays into both nostrils daily as needed for allergies.  10/01/16   Lucious Groves, DO  loratadine (CLARITIN) 10 MG tablet Take 1 tablet (10 mg total) by mouth daily as needed for allergies. 06/10/16 06/10/17  Alphonzo Grieve, MD  losartan (COZAAR) 100 MG tablet Take 100 mg by mouth daily. hypertension 01/20/17   [provider]  Multiple Vitamin (MULTIVITAMIN WITH MINERALS) TABS tablet Take 1 tablet by mouth daily. Patient taking differently: Take 1 tablet by mouth every other day.  08/29/15   Collier Salina, MD  NIFEdipine (ADALAT CC) 90 MG 24 hr tablet Take 1 tablet (90 mg total) by mouth daily. 02/10/17   Skeet Latch, MD  omeprazole (PRILOSEC) 40 MG capsule Take 1 capsule (40 mg total) by mouth daily before breakfast. Patient not taking: Reported on 01/26/2017 01/13/17   Gatha Mayer, MD  OXYGEN Inhale 2.5-3 L into the lungs at bedtime as needed (Shortness of Breath). 2.5 to 3 liters at night and as needed in the daytime.     [provider]  Tetrahydrozoline HCl (VISINE OP) Place 2 drops into both eyes daily as needed (for dry eyes).     [provider]  TIVICAY 50 MG tablet TAKE 1 TABLET BY MOUTH DAILY 01/07/17   [name], MD    Family History Family History  Problem Relation Age of Onset  . Asthma Mother   . Heart failure Mother        cardiomyopathy  . Heart murmur Sister   . Heart murmur Brother   . Diabetes Sister   . Thyroid disease Sister   . Heart murmur Sister     Social History Social History  Substance Use Topics  . Smoking status: Never Smoker  . Smokeless tobacco: Never Used  . Alcohol use 0.0 oz/week     Comment: Rarely.     Allergies   Tree extract and Ciprofloxacin   Review of Systems Review of Systems  Respiratory: Positive for chest tightness and shortness of breath.   Cardiovascular: Positive for palpitations.  All other systems reviewed and are negative.  Physical Exam Updated Vital Signs BP (!) 137/102 (BP Location: Left Arm)   Pulse (!) 150   Temp 98.4 F (36.9 C) (Oral)   Resp (!) 22   SpO2 96%   Physical Exam  Constitutional: She is oriented to person, place, and time. She appears well-developed and well-nourished.  Appears dyspneic.   HENT:  Head: Normocephalic and atraumatic.  Eyes: EOM are normal. Pupils are equal, round, and reactive to light.  Neck: Normal range of motion. Neck supple. No JVD present.  Cardiovascular: Regular rhythm and normal heart sounds.   No murmur heard. Tachycardic.   Pulmonary/Chest: Effort normal and breath sounds normal. She has no wheezes. She has no rales. She exhibits no tenderness.  Abdominal: Soft. Bowel sounds are normal. She exhibits no distension and no mass. There is no tenderness.  Musculoskeletal: Normal range of motion. She exhibits edema.  1+ pre-tibial edema.   Lymphadenopathy:    She has no cervical adenopathy.  Neurological: She is alert and oriented to person, place, and time. No cranial nerve deficit. She exhibits normal muscle tone. Coordination normal.  Skin: Skin is warm and dry. No rash noted.  Psychiatric: She has a normal mood and affect. Her behavior is normal. Judgment and thought  content normal.  Nursing note and vitals reviewed.    ED Treatments / Results  DIAGNOSTIC STUDIES: Oxygen Saturation is 96% on 3L/min [place], normal by my interpretation.   COORDINATION OF CARE: 11:23 PM-Discussed next steps with pt including cardioversion and IV fluids.Pt verbalized understanding and would like some time to think about it.    Labs (all labs ordered are listed, but only abnormal results are displayed) Labs Reviewed  BASIC METABOLIC PANEL - Abnormal; Notable for the following:       Result Value   Glucose, Bld 115 (*)    Creatinine, Ser 1.31 (*)    GFR calc non Af Amer 43 (*)    GFR calc Af Amer 49 (*)    All other components within normal limits  CBC  I-STAT TROPOININ, ED    EKG  EKG Interpretation  Date/Time:  Thursday February 18 2017 22:33:58 EDT Ventricular Rate:  152 PR Interval:    QRS Duration: 76 QT Interval:  320 QTC Calculation: 508 R Axis:   -40 Text Interpretation:  Atrial flutter with 2:1 A-V conduction Left axis deviation Pulmonary disease pattern Minimal voltage criteria for LVH, may be normal variant Marked ST abnormality, possible inferior subendocardial injury Abnormal ECG ST changes are likely rat-related When compared with ECG of 01/28/2017, Atrial flutter with 2 to 1 block has replaced Sinus rhythm St depression is now present - likely rate-related Confirmed by Delora Fuel ([id number]) on 02/18/2017 11:01:43 PM       Procedures .Cardioversion Date/Time: 02/19/2017 1:12 AM Performed by: Delora Fuel Authorized by: Roxanne Mins, Guage Efferson   Consent:    Consent obtained:  Verbal   Consent given by:  Patient   Risks discussed:  Death and induced arrhythmia   Alternatives discussed:  No treatment, rate-control medication and alternative treatment Pre-procedure details:    Cardioversion basis:  Elective   Rhythm:  Atrial flutter Attempt one:    Cardioversion mode attempt one: Oral flecainide. Post-procedure details:    Patient status:  Awake   Patient  tolerance of procedure:  Tolerated well, no immediate complications Comments:     She was also given diltiazem for rate control. She initially changed from 2:1 conduction to 4:1 conduction. She then converted to sinus rhythm.   (  including critical care time) CRITICAL CARE Performed by: NLZJQ,BHALP Total critical care time: 45 minutes Critical care time was exclusive of separately billable procedures and treating other patients. Critical care was necessary to treat or prevent imminent or life-threatening deterioration. Critical care was time spent personally by me on the following activities: development of treatment plan with patient and/or surrogate as well as nursing, discussions with consultants, evaluation of patient's response to treatment, examination of patient, obtaining history from patient or surrogate, ordering and performing treatments and interventions, ordering and review of laboratory studies, ordering and review of radiographic studies, pulse oximetry and re-evaluation of patient's condition.   Medications Ordered in ED Medications  diltiazem (CARDIZEM) tablet 30 mg (not administered)  diltiazem (CARDIZEM) 1 mg/mL load via infusion 20 mg (20 mg Intravenous Bolus from Bag 02/19/17 0024)  flecainide (TAMBOCOR) tablet 300 mg (300 mg Oral Given 02/19/17 0018)  apixaban (ELIQUIS) tablet 5 mg (5 mg Oral Given 02/19/17 0019)  ipratropium-albuterol (DUONEB) 0.5-2.5 (3) MG/3ML nebulizer solution 3 mL (3 mLs Nebulization Given 02/19/17 0035)     Initial Impression / Assessment and Plan / ED Course  I have reviewed the triage vital signs and the nursing notes.  Pertinent lab results that were available during my care of the patient were reviewed by me and considered in my medical decision making (see chart for details).  Atrial flutter with 2-1 conduction. Electrical cardioversion was recommended, but patient did not 1 to have that done. Therefore, she was given diltiazem for rate  control and was given a dose of oral flecainide. Old records are reviewed, and she had does have a history of nonobstructive coronary disease, but no history of atrial fibrillation or atrial flutter. Following above-noted treatment, heart rate dropped to 75, but she was still in atrial flutter. She started to feel dyspneic and was given nebulizer treatment with albuterol and ipratropium with significant improvement. Shortly after that, she converted to sinus rhythm. Review of laboratory work shows mild renal insufficiency which is unchanged from baseline. She is given a dose of oral diltiazem and is discharged with prescription for diltiazem and apixaban. She is referred to the atrial fibrillation clinic.  CHA2DS2/VAS Stroke Risk Points      ? >= 2 Points: High Risk  1 - 1.99 Points: Medium Risk  0 Points: Low Risk    A final score could not be computed because of missing components.:   Change: N/A         Details    Note: External data might be a factor in metrics not marked with    Points Metrics   This score determines the patient's risk of having a stroke if the  patient has atrial fibrillation.       1 Has Congestive Heart Failure:  Yes   1 Has Vascular Disease:  Yes   1 Has Hypertension:  Yes   0 Age:  82   0 Has Diabetes:  No   2 Had Stroke:  No Had TIA:  No Had thromboembolism:  Yes   1 Female:  Yes          Final Clinical Impressions(s) / ED Diagnoses   Final diagnoses:  Paroxysmal atrial flutter (HCC)  Renal insufficiency    New Prescriptions New Prescriptions   APIXABAN (ELIQUIS) 5 MG TABS TABLET    Take 1 tablet (5 mg total) by mouth 2 (two) times daily.   DILTIAZEM (CARDIZEM SR) 60 MG 12 HR CAPSULE    Take 1 capsule (  60 mg total) by mouth 2 (two) times daily.   I personally performed the services described in this documentation, which was scribed in my presence. The recorded information has been reviewed and is accurate.       Delora Fuel, MD 59/74/71  [id number]    Delora Fuel, MD 15/86/82 Areta Haber

## 2017-02-19 ENCOUNTER — Telehealth: Payer: Self-pay | Admitting: Cardiovascular Disease

## 2017-02-19 DIAGNOSIS — M8588 Other specified disorders of bone density and structure, other site: Secondary | ICD-10-CM | POA: Diagnosis not present

## 2017-02-19 DIAGNOSIS — Z1231 Encounter for screening mammogram for malignant neoplasm of breast: Secondary | ICD-10-CM | POA: Diagnosis not present

## 2017-02-19 DIAGNOSIS — I480 Paroxysmal atrial fibrillation: Secondary | ICD-10-CM | POA: Diagnosis not present

## 2017-02-19 DIAGNOSIS — Z13820 Encounter for screening for osteoporosis: Secondary | ICD-10-CM | POA: Diagnosis not present

## 2017-02-19 DIAGNOSIS — N958 Other specified menopausal and perimenopausal disorders: Secondary | ICD-10-CM | POA: Diagnosis not present

## 2017-02-19 MED ORDER — DILTIAZEM HCL ER 60 MG PO CP12: 60.0000 mg | ORAL_CAPSULE | Freq: Two times a day (BID) | ORAL | 0 refills | Status: DC

## 2017-02-19 MED ORDER — DILTIAZEM HCL 30 MG PO TABS
30.0000 mg | ORAL_TABLET | Freq: Once | ORAL | Status: AC
  Administered 2017-02-19: 30 mg via ORAL
  Filled 2017-02-19: qty 1

## 2017-02-19 MED ORDER — APIXABAN 5 MG PO TABS: 5.0000 mg | ORAL_TABLET | Freq: Two times a day (BID) | ORAL | 0 refills | Status: DC

## 2017-02-19 MED ORDER — IPRATROPIUM-ALBUTEROL 0.5-2.5 (3) MG/3ML IN SOLN
3.0000 mL | Freq: Once | RESPIRATORY_TRACT | Status: AC
  Administered 2017-02-19: 3 mL via RESPIRATORY_TRACT
  Filled 2017-02-19: qty 3

## 2017-02-19 NOTE — Telephone Encounter (Signed)
OK thank you for the update.

## 2017-02-19 NOTE — Telephone Encounter (Signed)
S/w pt she states that she was not feeling well all day yesterday and finally (per pt) went to the ER and states that her BP was too high and new dx of Atrial flutter and renal dz. Started APIXABAN 5mg  BID and DILTIAZEM 60 BID. She has appt Monday with Roderic Palau Monday and states that she want to relay this update to Dr Oval Linsey. She states that she would like a sooner appt to "discuss all these medications".

## 2017-02-19 NOTE — Telephone Encounter (Signed)
New message     Pt is calling asking for a call back from RN. She said it is about her visit in the ER. Please call.

## 2017-02-22 ENCOUNTER — Encounter: Payer: Self-pay | Admitting: *Deleted

## 2017-02-22 ENCOUNTER — Ambulatory Visit (HOSPITAL_COMMUNITY)
Admission: RE | Admit: 2017-02-22 | Discharge: 2017-02-22 | Disposition: A | Discharge status: Home / Self Care | Payer: Medicare Other | Source: Ambulatory Visit | Attending: Nurse Practitioner | Admitting: Nurse Practitioner

## 2017-02-22 VITALS — BP 142/88 | HR 75 | Ht 63.0 in | Wt 227.0 lb

## 2017-02-22 DIAGNOSIS — I10 Essential (primary) hypertension: Secondary | ICD-10-CM | POA: Insufficient documentation

## 2017-02-22 DIAGNOSIS — I2510 Atherosclerotic heart disease of native coronary artery without angina pectoris: Secondary | ICD-10-CM | POA: Insufficient documentation

## 2017-02-22 DIAGNOSIS — K219 Gastro-esophageal reflux disease without esophagitis: Secondary | ICD-10-CM | POA: Insufficient documentation

## 2017-02-22 DIAGNOSIS — I4892 Unspecified atrial flutter: Secondary | ICD-10-CM | POA: Insufficient documentation

## 2017-02-22 DIAGNOSIS — G4730 Sleep apnea, unspecified: Secondary | ICD-10-CM | POA: Diagnosis not present

## 2017-02-22 DIAGNOSIS — F419 Anxiety disorder, unspecified: Secondary | ICD-10-CM | POA: Insufficient documentation

## 2017-02-22 DIAGNOSIS — J449 Chronic obstructive pulmonary disease, unspecified: Secondary | ICD-10-CM | POA: Diagnosis not present

## 2017-02-22 DIAGNOSIS — Z7901 Long term (current) use of anticoagulants: Secondary | ICD-10-CM | POA: Insufficient documentation

## 2017-02-22 DIAGNOSIS — Z79899 Other long term (current) drug therapy: Secondary | ICD-10-CM | POA: Insufficient documentation

## 2017-02-22 DIAGNOSIS — B20 Human immunodeficiency virus [HIV] disease: Secondary | ICD-10-CM | POA: Diagnosis not present

## 2017-02-22 DIAGNOSIS — E785 Hyperlipidemia, unspecified: Secondary | ICD-10-CM | POA: Diagnosis not present

## 2017-02-22 MED ORDER — APIXABAN 5 MG PO TABS: 5.0000 mg | ORAL_TABLET | Freq: Two times a day (BID) | ORAL | 3 refills | Status: DC

## 2017-02-22 NOTE — Progress Notes (Addendum)
Primary Care Physician: Shannon Falcon, MD Referring Physician: ED F/u  Cardiologist: [name] is a 61 y.o. female with a h/o HIV, COPD/asthma, Sleep apnea, non obstructive CAD, HTN, that presented to the ED with shortness of breath and tachycardia, found to be in atrial flutter.She was offered cardioversion and declined. Was given flecainide 300 mg x 1 and converted. D/c on cardizem  60 mg bid and eliquis 5 mg bid for chadsvasc score of  5( female, htn, PE x 2, Vascular disease for h/o of nonobstructive CAD).  Today in the afib clinic, she has not noted any further heart racing. She reports much better BP control since starting on cardizem. Tolerating Eliquis without bleeding. Denies any alcohol, excessive caffeine, tobacco use. She is in college at [name].  Today, she denies symptoms of palpitations, chest pain  orthopnea, PND, lower extremity edema, dizziness, presyncope, syncope, or neurologic sequela.Chhronic shortness of breath from asthma, COPD. The patient is tolerating medications without difficulties and is otherwise without complaint today.   Past Medical History:  Diagnosis Date  . Anemia   . Anxiety    HX PANIC ATTACKS  . Arthritis    "starting to; in my hands" (07/09/2015)  . Chronic asthma with acute exacerbation    "I have chronic asthma all the time; sometimes exacerbations" (07/09/2015)  . Chronic lower back pain   . Cyst of right kidney    "3 of them; dx'd in ~ 01/2015"  . GERD (gastroesophageal reflux disease)   . Heart murmur   . History of blood transfusion    "related to my brain surgery I think"  . History of pulmonary embolism 07/09/2015  . HIV antibody positive ([place])   . HIV disease ([place])   . Hyperlipidemia   . Hypertension   . Lipodystrophy   . Pneumonia 07/09/2015  . Shingles   . Sleep apnea    "never completed part 2 of study; never wore mask" (07/09/2015)   Past Surgical History:  Procedure Laterality Date  . ABDOMINAL  HYSTERECTOMY     "robotic laparosopic"  . BRAIN SURGERY  1974   "brain tumor; benign; on top of my brain; got a plate in there"  . CARDIAC CATHETERIZATION    . TONSILLECTOMY AND ADENOIDECTOMY      Current Outpatient Prescriptions  Medication Sig Dispense Refill  . albuterol (PROAIR HFA) 108 (90 Base) MCG/ACT inhaler Inhale 2 puffs into the lungs every 4 (four) hours as needed for wheezing or shortness of breath. 6.7 g 3  . albuterol (PROVENTIL) (2.5 MG/3ML) 0.083% nebulizer solution Take 3 mLs (2.5 mg total) by nebulization every 6 (six) hours as needed for wheezing or shortness of breath. 75 mL 3  . apixaban (ELIQUIS) 5 MG TABS tablet Take 1 tablet (5 mg total) by mouth 2 (two) times daily. 60 tablet 3  . budesonide-formoterol (SYMBICORT) 80-4.5 MCG/ACT inhaler Inhale 2 puffs into the lungs 2 (two) times daily. 1 Inhaler 12  . carvedilol (COREG) 25 MG tablet Take 1 tablet (25 mg total) by mouth daily. 30 tablet 0  . cyanocobalamin (,VITAMIN B-12,) 1000 MCG/ML injection Inject 1 mL (1,000 mcg total) into the muscle every 30 (thirty) days. 1 mL 6  . DESCOVY 200-25 MG tablet TAKE 1 TABLET BY MOUTH DAILY 30 tablet 5  . diltiazem (CARDIZEM SR) 60 MG 12 hr capsule Take 1 capsule (60 mg total) by mouth 2 (two) times daily. 60 capsule 0  . fluticasone (FLONASE) 50 MCG/ACT  nasal spray Place 2 sprays into both nostrils daily. (Patient taking differently: Place 2 sprays into both nostrils daily as needed for allergies. ) 16 g 5  . loratadine (CLARITIN) 10 MG tablet Take 1 tablet (10 mg total) by mouth daily as needed for allergies. 30 tablet 2  . losartan (COZAAR) 100 MG tablet Take 100 mg by mouth daily. hypertension  1  . Multiple Vitamin (MULTIVITAMIN WITH MINERALS) TABS tablet Take 1 tablet by mouth daily. (Patient taking differently: Take 1 tablet by mouth every other day. ) 90 tablet   . NIFEdipine (ADALAT CC) 90 MG 24 hr tablet Take 1 tablet (90 mg total) by mouth daily. 30 tablet 5  . OXYGEN  Inhale 2.5-3 L into the lungs at bedtime as needed (Shortness of Breath). 2.5 to 3 liters at night and as needed in the daytime.     . Tetrahydrozoline HCl (VISINE OP) Place 2 drops into both eyes daily as needed (for dry eyes).     [name] TIVICAY 50 MG tablet TAKE 1 TABLET BY MOUTH DAILY 30 tablet 5  . acetaminophen (TYLENOL) 500 MG tablet Take 500 mg by mouth every 6 (six) hours as needed for mild pain.     . citalopram (CELEXA) 10 MG tablet Take 10 mg by mouth daily.     No current facility-administered medications for this encounter.     Allergies  Allergen Reactions  . Tree Extract Swelling and Other (See Comments)    Swelling to eyes  . Ciprofloxacin Hives    Social History   Social History  . Marital status: Divorced    Spouse name: N/A  . Number of children: N/A  . Years of education: N/A   Occupational History  . student    Social History Main Topics  . Smoking status: Never Smoker  . Smokeless tobacco: Never Used  . Alcohol use 0.0 oz/week     Comment: Rarely.  . Drug use: No  . Sexual activity: Not Currently    Partners: Male     Comment: declined condoms   Other Topics Concern  . Not on file   Social History Narrative   Originally from [place] then [place] now [place] 2016 approx   Disabled but going to [employer] [address]   Updated 01/13/2017       Family History  Problem Relation Age of Onset  . Asthma Mother   . Heart failure Mother        cardiomyopathy  . Heart murmur Sister   . Heart murmur Brother   . Diabetes Sister   . Thyroid disease Sister   . Heart murmur Sister     ROS- All systems are reviewed and negative except as per the HPI above  Physical Exam: Vitals:   02/22/17 0906  BP: (!) 142/88  Pulse: 75  Weight: 227 lb (103 kg)  Height: 5\' 3"  (1.6 m)   Wt Readings from Last 3 Encounters:  02/22/17 227 lb (103 kg)  02/11/17 224 lb (101.6 kg)  02/10/17 221 lb (100.2 kg)    Labs: Lab Results  Component Value Date    NA 140 02/18/2017   K 4.0 02/18/2017   CL 101 02/18/2017   CO2 31 02/18/2017   GLUCOSE 115 (H) 02/18/2017   BUN 16 02/18/2017   CREATININE 1.31 (H) 02/18/2017   CALCIUM 10.1 02/18/2017   Lab Results  Component Value Date   INR 1.00 07/10/2015   Lab Results  Component Value Date  CHOL 217 (H) 11/26/2016   HDL 67 11/26/2016   LDLCALC 132 (H) 11/26/2016   TRIG 88 11/26/2016     GEN- The patient is well appearing, alert and oriented x 3 today.   Head- normocephalic, atraumatic Eyes-  Sclera clear, conjunctiva pink Ears- hearing intact Oropharynx- clear Neck- supple, no JVP Lymph- no cervical lymphadenopathy Lungs- Clear to ausculation bilaterally, normal work of breathing Heart- Regular rate and rhythm, no murmurs, rubs or gallops, PMI not laterally displaced GI- soft, NT, ND, + BS Extremities- no clubbing, cyanosis, or edema MS- no significant deformity or atrophy Skin- no rash or lesion Psych- euthymic mood, full affect Neuro- strength and sensation are intact  EKG- NSR, normal EKG Echo-Impressions:  - Normal LV size with mild LV hypertrophy. EF 60-65%. Calcified,   trileaflet aortic valve. Sclerosis without significant stenosis,   mild to moderate aortic insufficiency. Normal RV size and   systolic function. Biatrial enlargement.    Assessment and Plan: 1. New onset atrial flutter General education re atrial flutter and triggers Continue Cardizem  General precautions with anticoagulation discussed Continue eliquis for a chadsvasc score of at least 5 Treatment of sleep apnea discussed Regular exercise and weight loss also discussed with pt stating that she has plans for cardiac rehab and dietician support  F/u with Dr. Oval Linsey as scheduled 8/20 afib clinic in 8 weeks or as needed  [facility] [place]. Efrat Zuidema, [employer] [name] [address] [place], [place] [address] [phone]

## 2017-02-25 ENCOUNTER — Ambulatory Visit: Payer: Medicare Other | Admitting: Internal Medicine

## 2017-03-04 ENCOUNTER — Ambulatory Visit (INDEPENDENT_AMBULATORY_CARE_PROVIDER_SITE_OTHER): Payer: Medicare Other | Admitting: Dietician

## 2017-03-04 DIAGNOSIS — Z713 Dietary counseling and surveillance: Secondary | ICD-10-CM | POA: Diagnosis not present

## 2017-03-04 DIAGNOSIS — Z6839 Body mass index (BMI) 39.0-39.9, adult: Secondary | ICD-10-CM | POA: Diagnosis not present

## 2017-03-04 DIAGNOSIS — N183 Chronic kidney disease, stage 3 unspecified: Secondary | ICD-10-CM

## 2017-03-04 NOTE — Patient Instructions (Addendum)
Keep track of your food to discover what you are eating and drinking for the next few weeks.   Weigh at home at least one time a week and record in the book.

## 2017-03-04 NOTE — Progress Notes (Signed)
  Medical Nutrition Therapy:  Appt start time: 0930 end time:  1030. Visit # 1 on anticipated 3+ needed visits  Assessment:  Primary concerns today: weight management Ms. Holden says her recent experience with Afib and also finding out she has osteopenia have  Made her more serious about weight loss and eating more nutritiously. She is concerned about the effect of her weight on her blood pressure, breathing and sleeping. We also discussed that the same dietary/lifestyle changes will help her kidney disease and heart as well. She is participating in pulmonary rehab, has cut out sweets, sodas and many junk foods. She recently got a digital scale to weigh at home. She is interested in tracking her foods and learning how to get her nutrients form her food.  Preferred Learning Style:  No preference indicated  Learning Readiness: Change in progress  ANTHROPOMETRICS: weight-224.9#,  Height-53.5", BMI-39.21 WEIGHT HISTORY: Highest:240#  Lowest-210# says she'd feel too thin less than 190# SLEEP:just started getting better sleep with CPAP  Constipation: periodical problems   MEDICATIONS: reviewed with patient Supplements: centrum silver, D3, calcium & D, CoQ10, Lysine, KCL, Magnesium, B12, vitamin C supplemental drink  DIETARY INTAKE: Usual eating pattern includes 2-3 meals and 1-2 snacks per day.Food Intolerances/allergies: none reported Everyday foods include fruit, vegetables, water, yogurt, pasta.  Avoided foods include sweets, chips   Dining Out (times/week):currently ~1-2x/week 24-hr recall:  B ( 10-11AM): plum,water, fruit juice, sometimes yogurt   Snk: milk & chocolate cheesecake D ( 4:30 PM): milk, salmon x4 oz, cabbage, red potatoes Snk ( PM): not discussed today Beverages: water, juice, ensure, coffee, Vitamin C supplemental drink  Estimated daily energy needs for gradual weight loss 1800 calories 200-250 g carbohydrates 100-100 g protein  30-40g fat  Progress Towards Goal(s):  In  progress.   Nutritional Diagnosis:  NB-1.1 Food and nutrition-related knowledge deficit As related to lack of prior suffient healthy meal planning training.  As evidenced by her report and questions.    Intervention:  Nutrition education about balanced meal panning, weight loss and how to obtain the best nutrition from foods. . Coordination of care:agree with weight loss medicine if patient agrees vs bariatric surgery. Discuss with patient at next visit   Teaching Method Utilized: Visual,Auditory,Hands on Handouts given during visit include: food & activity tracker Barriers to learning/adherence to lifestyle change:  competing values Demonstrated degree of understanding via:  Teach Back   Monitoring/Evaluation:  Dietary intake, exercise, food tracker, and body weight in 3 week(s)  Plyler, Butch Penny, RD 03/04/2017 3:00 PM. .

## 2017-03-09 DIAGNOSIS — Z01419 Encounter for gynecological examination (general) (routine) without abnormal findings: Secondary | ICD-10-CM | POA: Diagnosis not present

## 2017-03-09 DIAGNOSIS — Z124 Encounter for screening for malignant neoplasm of cervix: Secondary | ICD-10-CM | POA: Diagnosis not present

## 2017-03-10 ENCOUNTER — Ambulatory Visit: Payer: Medicare Other | Admitting: Internal Medicine

## 2017-03-11 ENCOUNTER — Ambulatory Visit (INDEPENDENT_AMBULATORY_CARE_PROVIDER_SITE_OTHER): Payer: Medicare Other | Admitting: Internal Medicine

## 2017-03-11 ENCOUNTER — Encounter: Payer: Self-pay | Admitting: Internal Medicine

## 2017-03-11 ENCOUNTER — Ambulatory Visit (HOSPITAL_COMMUNITY)
Admission: RE | Admit: 2017-03-11 | Discharge: 2017-03-11 | Disposition: A | Discharge status: Home / Self Care | Payer: Medicare Other | Source: Ambulatory Visit | Attending: Internal Medicine | Admitting: Internal Medicine

## 2017-03-11 DIAGNOSIS — I129 Hypertensive chronic kidney disease with stage 1 through stage 4 chronic kidney disease, or unspecified chronic kidney disease: Secondary | ICD-10-CM

## 2017-03-11 DIAGNOSIS — N281 Cyst of kidney, acquired: Secondary | ICD-10-CM | POA: Diagnosis not present

## 2017-03-11 DIAGNOSIS — I483 Typical atrial flutter: Secondary | ICD-10-CM | POA: Diagnosis not present

## 2017-03-11 DIAGNOSIS — I4892 Unspecified atrial flutter: Secondary | ICD-10-CM | POA: Insufficient documentation

## 2017-03-11 DIAGNOSIS — N189 Chronic kidney disease, unspecified: Secondary | ICD-10-CM

## 2017-03-11 DIAGNOSIS — R0602 Shortness of breath: Secondary | ICD-10-CM | POA: Diagnosis not present

## 2017-03-11 DIAGNOSIS — M5412 Radiculopathy, cervical region: Secondary | ICD-10-CM

## 2017-03-11 DIAGNOSIS — Z6841 Body Mass Index (BMI) 40.0 and over, adult: Secondary | ICD-10-CM

## 2017-03-11 DIAGNOSIS — I77810 Thoracic aortic ectasia: Secondary | ICD-10-CM

## 2017-03-11 DIAGNOSIS — I10 Essential (primary) hypertension: Secondary | ICD-10-CM

## 2017-03-11 MED ORDER — DILTIAZEM HCL ER 60 MG PO CP12: 60.0000 mg | ORAL_CAPSULE | Freq: Two times a day (BID) | ORAL | 2 refills | Status: DC

## 2017-03-11 MED ORDER — CARVEDILOL 25 MG PO TABS: 12.5000 mg | ORAL_TABLET | Freq: Every day | ORAL | 2 refills | Status: DC

## 2017-03-11 MED ORDER — CARVEDILOL 25 MG PO TABS: 25.0000 mg | ORAL_TABLET | Freq: Every day | ORAL | 2 refills | Status: DC

## 2017-03-11 NOTE — Assessment & Plan Note (Signed)
BP mildly elevated today, however, Shannon [name] reports that at home she has 2 blood pressure cuffs and she checks regularly.  This is the highest it has been since last month.  She would actually like to decrease her medications.  She notes that when she checks her blood pressure in the morning, she will only take 1/2 of her BP medications and sometimes skip them.    We discussed this issue at length and how her BP is likely reflecting that her medication is working.  She should continue to take all of her medication.   Given she is on 2 nodal blocking agents now, will decrease Coreg to 12.5mg  daily.  Continue all other agents as prescribed including diltiazem, losartan.  She reports being on HCTZ as well, but I do not see this on her medication list.

## 2017-03-11 NOTE — Patient Instructions (Addendum)
Ms. Randal - -  Thank you for coming in today.   For your concern that your blood pressure has been too low and dizziness, please decrease your coreg (carvedilol) to 12.5mg  daily.  You can cut your current tablets in half and then pick up the new prescription.   For your A flutter, please continue diltiazem and eliquis.  I will send in refills for these today.   For your desire for weight loss, I agree with restarting pulmonary rehab.    For your lipodystrophy (fat accumulation), I would recommend diet and exercise which you are planning.  I will recheck a lab called A1C at next visit.  If it shows pre-diabetes, we can start a medication called metformin which may also help.   Please schedule your MRI of the abdomen to evaluate your renal cysts which we talked about at last visit.    Please come back to see me in 2 months.    Thank you!

## 2017-03-11 NOTE — Assessment & Plan Note (Signed)
She is very interested in diet and lifestyle.  She feels she has lipodystrophy from her HIV medications.  She would like options for how to help with this.  I advised her that first line is diet and exercise.  Next line would be possibly metformin. I will check an A1C at next visit and if still in the pre-diabetic range, I will plan to start metformin.   Plan Diet/lifestyle changes, restart pulmonary rehab.

## 2017-03-11 NOTE — Assessment & Plan Note (Signed)
She has been due for a follow MRI for a long while and has not scheduled this.  I advised her to schedule.

## 2017-03-11 NOTE — Assessment & Plan Note (Signed)
She will have follow scan in May 2019.

## 2017-03-11 NOTE — Assessment & Plan Note (Signed)
She did not report any issues today and did not follow up with previous MRI which was ordered.

## 2017-03-11 NOTE — Progress Notes (Signed)
Subjective:    Patient ID: Shannon [name], female    DOB: 08-Dec-1955, 61 y.o.   MRN: [id number]  CC: hospital follow up for A flutter  HPI  Shannon [name] is a 61yo woman with PMH of Asthma, HIV, HTN, GAD, CKD, renal cysts, new onset Aflutter.    Shannon [name] was seen in the ED on 6/28 for palpitations and dyspnea and found to be in Aflutter.  She was started on eliquis and diltiazem and she notes that the palpitations have not returned. She was seen in Afib/flutter clinic and her medications were continued.  She feels that these medications are helping her a lot.  She has a very strong hesitancy about new medications.  Now that she is on the diltiazem she feels she is on too many medications and she has been cutting some of her other blood pressure medications in half.  She notes her systolic BP has been in the 120s at home.  It was 144/78 today.  She is now on 2 nodal blocking agents and her pulse today was 86 and regular.  She sees Dr. Oval Linsey in Cardiology.  She has nonobstructive CAD based on previous work up and is on coreg, losartan.  She is also on hctz, nifedipine for BP control.  EKG done today shows that she is in NSR with a HR of 75.    She is very concerned about her fat accumulation which she attributes to previous HIV medications she used many years ago.  She notes that these areas of fat on her shoulder and neck move around and cause her pain.  She also notes increased abdominal fat deposition.  She spoke about this at length today and feels that this is the cause of her occasional dyspnea and inability to work out.  She reports taking her current HIV regimen which includes desocvy and tivicay.  She also feels these are causing her to lose sleep.    She has met with a nutritionist for her obesity and feels that this helps.  She feels that once the stress of school is done (last final today), she will have more energy and time to work out and watch her diet more closely.  She is interested in  getting back in to pulmonary rehab.    She had a fall a couple of weeks back.  She sat in a broken chair and it gave out beneath her.  She is nervous that it affected her skull.  She reports a history of a fall causing a brain tumor in the 1970s, but I do not have records of this.    Finally, she reports that she went to her OB/Gyn yesterday and was told she has osteopenia based on a DEXA scan done there.  She was advised to start taking calcium and vitamin D.  She also sees a GI doctor who plan to do EGD and colonoscopy for chronic constipation and screening, per Shannon [name].    Shannon [name] is very concerned about the amount of medications she is on and would like to cut back on some of them.    Review of Systems  Constitutional: Positive for fatigue. Negative for activity change.  Respiratory: Positive for shortness of breath (with activity). Negative for cough.   Cardiovascular: Negative for chest pain and leg swelling.  Gastrointestinal: Positive for constipation. Negative for abdominal pain.  Neurological: Positive for dizziness and light-headedness. Negative for syncope and weakness.  Psychiatric/Behavioral: Positive for sleep disturbance. The patient  is nervous/anxious.        Objective:   Physical Exam  Constitutional: She is oriented to person, place, and time. She appears well-developed and well-nourished.  HENT:  Head: Normocephalic and atraumatic.  Cardiovascular: Normal rate and regular rhythm.   Murmur heard. Pulmonary/Chest: Effort normal and breath sounds normal. No respiratory distress. She has no wheezes.  Musculoskeletal: She exhibits no edema or tenderness.  Neurological: She is alert and oriented to person, place, and time.  Psychiatric: Her mood appears anxious. Her affect is not inappropriate. She does not exhibit a depressed mood.       Assessment & Plan:  RTC in 2 months.

## 2017-03-11 NOTE — Assessment & Plan Note (Signed)
She continues to have off an on dyspnea and attritbutes this to deconditioning.  She is very interested in exercising and feels she will have time after her last final is over.  She is interested in restarting pulmonary rehab.  I advised her to call the rehab and see if they need a new consult.

## 2017-03-11 NOTE — Assessment & Plan Note (Signed)
This is a new diagnosis found in the ED in June of this year.  She is following with Cardiology.  She is doing very well on Diltiazem and Eliquis.  She feels these are helping her.  EKG today showed NSR and a controlled rate.  She has had no bleeding or side effects from Eliquis.  She is now on 2 nodal blocking agents, PR interval was < 200.  She feels her medications make her nauseous and keep her from sleeping.  She is requesting that she go down on her BP medications.   Given her history of non-ob CAD based on review of cardiology notes, would not want to completely stop coreg at this time.   Plan Decrease coreg to 12.5mg  Continue diltiazem at 60mg  BID given controlled HR Continue Eliquis 5mg  BID - Rx sent to pharmacy. (She has CKD, but Cr is 1.3).

## 2017-03-12 ENCOUNTER — Ambulatory Visit: Payer: Medicare Other | Admitting: Cardiovascular Disease

## 2017-03-12 DIAGNOSIS — G4733 Obstructive sleep apnea (adult) (pediatric): Secondary | ICD-10-CM | POA: Diagnosis not present

## 2017-03-12 DIAGNOSIS — J189 Pneumonia, unspecified organism: Secondary | ICD-10-CM | POA: Diagnosis not present

## 2017-03-12 NOTE — Telephone Encounter (Signed)
Telephone note opened in error

## 2017-03-24 ENCOUNTER — Encounter: Payer: Self-pay | Admitting: *Deleted

## 2017-03-24 ENCOUNTER — Ambulatory Visit: Payer: Medicare Other | Admitting: Dietician

## 2017-03-24 ENCOUNTER — Ambulatory Visit: Payer: Medicare Other | Admitting: Cardiovascular Disease

## 2017-03-26 ENCOUNTER — Ambulatory Visit (INDEPENDENT_AMBULATORY_CARE_PROVIDER_SITE_OTHER): Payer: Medicare Other | Admitting: Internal Medicine

## 2017-03-26 VITALS — BP 147/99 | HR 89 | Temp 98.5°F | Wt 229.5 lb

## 2017-03-26 DIAGNOSIS — Z79899 Other long term (current) drug therapy: Secondary | ICD-10-CM | POA: Diagnosis not present

## 2017-03-26 DIAGNOSIS — Z8249 Family history of ischemic heart disease and other diseases of the circulatory system: Secondary | ICD-10-CM

## 2017-03-26 DIAGNOSIS — I10 Essential (primary) hypertension: Secondary | ICD-10-CM | POA: Diagnosis not present

## 2017-03-26 DIAGNOSIS — Z825 Family history of asthma and other chronic lower respiratory diseases: Secondary | ICD-10-CM | POA: Diagnosis not present

## 2017-03-26 DIAGNOSIS — J0140 Acute pansinusitis, unspecified: Secondary | ICD-10-CM | POA: Diagnosis not present

## 2017-03-26 MED ORDER — SALINE SPRAY 0.65 % NA SOLN: 1.0000 | NASAL | 1 refills | Status: DC | PRN

## 2017-03-26 MED ORDER — AMOXICILLIN 500 MG PO CAPS: 500.0000 mg | ORAL_CAPSULE | Freq: Three times a day (TID) | ORAL | 0 refills | Status: AC

## 2017-03-26 NOTE — Progress Notes (Signed)
   CC: Sinus congestion with greenish secretions for 6 days.  HPI:  Shannon [name] is a 61 y.o. with past medical history as listed below came to the clinic with complaint of nasal and sinus congestion with greenish discharge for about 6 days, accompanied with frontal headache and dizziness. She was also having some cough with greenish sputum, described it more like postnasal drip. She denies any fever or sweats. She denies any chest congestion, chest pain, shortness of breath or palpitations. She was also complaining of a bump behind her right ear started yesterday. She was concerned that it is the result of her fall from the chair 2 weeks ago and she might be developing another brain tumor. She has an remote history of meningioma which she really thinks was a result of head trauma. She denies any ear pain or change in her hearing.  Past Medical History:  Diagnosis Date  . Anemia   . Anxiety    HX PANIC ATTACKS  . Arthritis    "starting to; in my hands" (07/09/2015)  . Chronic asthma with acute exacerbation    "I have chronic asthma all the time; sometimes exacerbations" (07/09/2015)  . Chronic lower back pain   . Cyst of right kidney    "3 of them; dx'd in ~ 01/2015"  . GERD (gastroesophageal reflux disease)   . Heart murmur   . History of blood transfusion    "related to my brain surgery I think"  . History of pulmonary embolism 07/09/2015  . HIV antibody positive ([place])   . HIV disease ([place])   . Hyperlipidemia   . Hypertension   . Lipodystrophy   . Pneumonia 07/09/2015  . Shingles   . Sleep apnea    "never completed part 2 of study; never wore mask" (07/09/2015)   Review of Systems:  As per HPI.  Physical Exam:  Vitals:   03/26/17 1004  BP: (!) 147/99  Pulse: 89  Temp: 98.5 F (36.9 C)  TempSrc: Oral  SpO2: 94%  Weight: 229 lb 8 oz (104.1 kg)    General: Vital signs reviewed.  Patient is well-developed and well-nourished, in no acute distress and cooperative  with exam.  Head: Normocephalic and atraumatic.A small erythematous area behind right ear appears like a bug bite. Bilateral frontal and maxillary sinus tenderness, mildly erythematous nasal turbinates, mildly erythematous pharyngeal mucosa with some exudate. Eyes: EOMI, conjunctivae normal, no scleral icterus.  Cardiovascular: RRR, S1 normal, S2 normal, no murmurs, gallops, or rubs. Pulmonary/Chest: Clear to auscultation bilaterally, no wheezes, rales, or rhonchi. Abdominal: Soft, non-tender, non-distended, BS +, no masses, organomegaly, or guarding present.  Extremities: No lower extremity edema bilaterally,  pulses symmetric and intact bilaterally. No cyanosis or clubbing.   Assessment & Plan:   See Encounters Tab for problem based charting.  Patient discussed with Dr. Daryll Drown.

## 2017-03-26 NOTE — Patient Instructions (Signed)
Thank you for visiting clinic today. I'm giving you amoxicillin for one week, please take it as directed. Use your Flonase regularly for a few days, you can also use saline nasal spray 3-4 times daily to help clear your sinuses. Keep taking your other medications according to your schedule. Please contact our clinic if your symptoms fail to resolve or get worse.

## 2017-03-26 NOTE — Assessment & Plan Note (Signed)
Her symptoms were more consistent with sinusitis.  I initially wants to give her Augmentin, patient states that Augmentin is very strong for her and she was requesting amoxicillin or azithromycin.  -Amoxicillin 500 mg every 8 hourly for 7 days. -Saline nasal spray. -She was advised to return to the clinic if her symptoms get worse or fail to resolve with the next few days.

## 2017-03-26 NOTE — Assessment & Plan Note (Signed)
BP Readings from Last 3 Encounters:  03/26/17 (!) 147/99  03/11/17 (!) 144/78  02/22/17 (!) 142/88   Her blood pressure was elevated today. She stated that it is more just likely because of her sinus congestion and continues headache related to that.  We will continue with the current management, reassess during next follow-up visit.

## 2017-03-29 NOTE — Progress Notes (Signed)
Internal Medicine Clinic Attending  Case discussed with Dr. Amin at the time of the visit.  We reviewed the resident's history and exam and pertinent patient test results.  I agree with the assessment, diagnosis, and plan of care documented in the resident's note.    

## 2017-03-30 ENCOUNTER — Ambulatory Visit: Payer: Medicare Other | Admitting: Dietician

## 2017-04-05 ENCOUNTER — Inpatient Hospital Stay (HOSPITAL_COMMUNITY): Admission: RE | Admit: 2017-04-05 | Payer: Medicare Other | Source: Ambulatory Visit | Admitting: Nurse Practitioner

## 2017-04-05 ENCOUNTER — Encounter (HOSPITAL_COMMUNITY): Payer: Self-pay | Admitting: *Deleted

## 2017-04-09 ENCOUNTER — Inpatient Hospital Stay (HOSPITAL_COMMUNITY): Admission: RE | Admit: 2017-04-09 | Payer: Medicare Other | Source: Ambulatory Visit | Admitting: Nurse Practitioner

## 2017-04-09 ENCOUNTER — Ambulatory Visit: Payer: Medicare Other | Admitting: Pulmonary Disease

## 2017-04-12 DIAGNOSIS — G4733 Obstructive sleep apnea (adult) (pediatric): Secondary | ICD-10-CM | POA: Diagnosis not present

## 2017-04-12 DIAGNOSIS — J189 Pneumonia, unspecified organism: Secondary | ICD-10-CM | POA: Diagnosis not present

## 2017-04-13 ENCOUNTER — Ambulatory Visit: Payer: Medicare Other | Admitting: Adult Health

## 2017-04-23 ENCOUNTER — Telehealth: Payer: Self-pay | Admitting: Pulmonary Disease

## 2017-04-23 MED ORDER — PREDNISONE 10 MG PO TABS: ORAL_TABLET | ORAL | 0 refills | Status: DC

## 2017-04-23 MED ORDER — DOXYCYCLINE HYCLATE 100 MG PO TABS: 100.0000 mg | ORAL_TABLET | Freq: Two times a day (BID) | ORAL | 0 refills | Status: DC

## 2017-04-23 NOTE — Telephone Encounter (Signed)
Called and spoke to pt. Pt c/o PND, prod cough with little mucus production - green mucus, chest discomfort d/t congestion, increase in SOB x 3-4 days. Pt states mucinex and drinking lots of water. Pt former Dr. Corrie Dandy pt, seen for asthma. Last seen on  5.24.18.   Dr. Melvyn Novas please advise. Thanks.

## 2017-04-23 NOTE — Telephone Encounter (Signed)
Drug to drug interaction with Zpak and Citalopram with risk of QT prolongation.   MW please advise. Thanks.

## 2017-04-23 NOTE — Telephone Encounter (Signed)
Spoke with patient. She is aware of MW's recs. Will go ahead and call in medications. Nothing else needed at time of call.

## 2017-04-23 NOTE — Telephone Encounter (Signed)
Doxycylcine 100 mg twice daily x 10 days

## 2017-04-23 NOTE — Telephone Encounter (Signed)
zpak Prednisone 10 mg take  4 each am x 2 days,   2 each am x 2 days,  1 each am x 2 days and stop  F/u with NP w/in 2 weeks

## 2017-05-04 ENCOUNTER — Other Ambulatory Visit: Payer: Self-pay

## 2017-05-04 MED ORDER — ALBUTEROL SULFATE HFA 108 (90 BASE) MCG/ACT IN AERS: 2.0000 | INHALATION_SPRAY | RESPIRATORY_TRACT | 3 refills | Status: DC | PRN

## 2017-05-05 ENCOUNTER — Encounter: Payer: Self-pay | Admitting: Internal Medicine

## 2017-05-05 ENCOUNTER — Ambulatory Visit (INDEPENDENT_AMBULATORY_CARE_PROVIDER_SITE_OTHER): Payer: Medicare Other | Admitting: Internal Medicine

## 2017-05-05 VITALS — BP 148/88 | HR 80 | Temp 98.3°F | Ht 63.0 in | Wt 224.7 lb

## 2017-05-05 DIAGNOSIS — N183 Chronic kidney disease, stage 3 unspecified: Secondary | ICD-10-CM

## 2017-05-05 DIAGNOSIS — M79604 Pain in right leg: Secondary | ICD-10-CM

## 2017-05-05 DIAGNOSIS — M545 Low back pain: Secondary | ICD-10-CM | POA: Diagnosis not present

## 2017-05-05 DIAGNOSIS — M79605 Pain in left leg: Secondary | ICD-10-CM | POA: Diagnosis not present

## 2017-05-05 DIAGNOSIS — I4892 Unspecified atrial flutter: Secondary | ICD-10-CM

## 2017-05-05 DIAGNOSIS — N281 Cyst of kidney, acquired: Secondary | ICD-10-CM

## 2017-05-05 DIAGNOSIS — Z7951 Long term (current) use of inhaled steroids: Secondary | ICD-10-CM

## 2017-05-05 DIAGNOSIS — I129 Hypertensive chronic kidney disease with stage 1 through stage 4 chronic kidney disease, or unspecified chronic kidney disease: Secondary | ICD-10-CM

## 2017-05-05 DIAGNOSIS — M791 Myalgia: Secondary | ICD-10-CM

## 2017-05-05 DIAGNOSIS — Q6102 Congenital multiple renal cysts: Secondary | ICD-10-CM | POA: Diagnosis not present

## 2017-05-05 DIAGNOSIS — Z724 Inappropriate diet and eating habits: Secondary | ICD-10-CM | POA: Diagnosis not present

## 2017-05-05 DIAGNOSIS — Z9114 Patient's other noncompliance with medication regimen: Secondary | ICD-10-CM

## 2017-05-05 DIAGNOSIS — F411 Generalized anxiety disorder: Secondary | ICD-10-CM | POA: Diagnosis not present

## 2017-05-05 DIAGNOSIS — I10 Essential (primary) hypertension: Secondary | ICD-10-CM | POA: Diagnosis not present

## 2017-05-05 DIAGNOSIS — J4540 Moderate persistent asthma, uncomplicated: Secondary | ICD-10-CM

## 2017-05-05 DIAGNOSIS — Z21 Asymptomatic human immunodeficiency virus [HIV] infection status: Secondary | ICD-10-CM | POA: Diagnosis not present

## 2017-05-05 DIAGNOSIS — I483 Typical atrial flutter: Secondary | ICD-10-CM

## 2017-05-05 DIAGNOSIS — R109 Unspecified abdominal pain: Secondary | ICD-10-CM | POA: Diagnosis not present

## 2017-05-05 DIAGNOSIS — J0140 Acute pansinusitis, unspecified: Secondary | ICD-10-CM

## 2017-05-05 DIAGNOSIS — Z79899 Other long term (current) drug therapy: Secondary | ICD-10-CM

## 2017-05-05 MED ORDER — RIVAROXABAN 20 MG PO TABS: 20.0000 mg | ORAL_TABLET | Freq: Every day | ORAL | 6 refills | Status: DC

## 2017-05-05 MED ORDER — ALBUTEROL SULFATE HFA 108 (90 BASE) MCG/ACT IN AERS: 2.0000 | INHALATION_SPRAY | RESPIRATORY_TRACT | 3 refills | Status: DC | PRN

## 2017-05-05 NOTE — Progress Notes (Signed)
Subjective:    Patient ID: Shannon [name], female    DOB: 1956/04/08, 61 y.o.   MRN: [id number]  CC: 2 month follow up for HTN, A flutter  HPI  Shannon [name] is a 61yo woman with PMH of Aflutter, HTN, asthma, HIV, CKD, GAD, and recent pansinusitis who presents for follow up.   Shannon [name] reports multiple issues today.  The most pressing seems to be right flank pain which has been going on for 2-3 weeks.  She reports "knowing" it is her kidneys.  She feels this is causing her to be lethargic, have loss of coordination and to be more SOB.  She has pain in the kidneys, particularly the right which radiates to the right lower abdomen. She reports no blood in the urine, no dysuria or change in urine color or smell.  She has increased her water intake and is taking cranberry juice.  She has decreased her tea/coffee drinking and decreased her red meat.  She is currently getting over a sinusitis and she feels the doxycycline is working for her.  She is not taking the prednisone because it made her feel lethargic.  She is concerned about weight loss (she has been pretty stable over the last few months) and feeling like she has too much adipose tissues.  She feels the extra fat also makes her kidneys hurt.    She has stopped taking her Eliquis because she felt it made her very cold.  She denies any blood loss from the bowels or urine.  She reports that the feeling of extreme cold has gone away now that she stopped the medication. We discussed the reason for the medication, to reduce stroke, given her CHADs VASC score and her aflutter.  She is amenable to changing to a different DOAC.    She touched on some memory issues, forgetting things easier and getting lost.  However, she attributes this to her recent illness and worsening of her sleep.  She has missed school this week and would like a letter to explain her absence.    She is very concerned about being on too many blood pressure medications.  She notes that  she sometimes does not take her medications.  She is checking her BP at home and sometimes the upper number is in the 90s which concerns her.  Her BP is always elevated in clinic so I asked her to bring in a blood pressure log.    She has been running out of her Albuterol inhaler earlier.  She is on the smallest inhaler size, so I increased the size.    Review of Systems  Constitutional: Positive for activity change and fatigue. Negative for chills and fever.  HENT: Positive for congestion and rhinorrhea.   Eyes: Negative for photophobia and visual disturbance.  Respiratory: Negative for cough and choking.   Cardiovascular: Positive for leg swelling (reports this is getting better). Negative for chest pain.  Genitourinary: Positive for flank pain. Negative for decreased urine volume, difficulty urinating, dysuria, enuresis, frequency, hematuria and urgency.  Musculoskeletal: Positive for myalgias (leg pains).  Neurological: Positive for dizziness, weakness and light-headedness.       Objective:   Physical Exam  Constitutional: She is oriented to person, place, and time. She appears well-developed and well-nourished. No distress.  HENT:  Head: Normocephalic and atraumatic.  Cardiovascular: Normal rate, regular rhythm and normal heart sounds.   No murmur heard. Pulmonary/Chest: Breath sounds normal. No respiratory distress. She has no wheezes.  Abdominal: Soft. Bowel sounds are normal.  She has no CVA or abdominal tenderness  Musculoskeletal: She exhibits edema (She has some non pitting edema, changes to skin due to tight fitting socks) and tenderness (reports generalized bilateral tenderness to legs. ).  Neurological: She is alert and oriented to person, place, and time.  Psychiatric: She has a normal mood and affect. Her behavior is normal.    CBC, UA, BMET today.      Assessment & Plan:  RTC in 1-2 months

## 2017-05-05 NOTE — Patient Instructions (Signed)
Shannon [name] - -  For your anxiety - - Please start citalopram 10mg  daily which you have at home.   For your Aflutter - - Please start Xarelto to protect you from a stroke.  More information below.    Please come back to see me in 1-2 months so that we can further discuss your medical conditions.  I will call you if any of your labs are abnormal.   Thank you!  Rivaroxaban oral tablets What is this medicine? RIVAROXABAN (ri va ROX a ban) is an anticoagulant (blood thinner). It is used to treat blood clots in the lungs or in the veins. It is also used after knee or hip surgeries to prevent blood clots. It is also used to lower the chance of stroke in people with a medical condition called atrial fibrillation. This medicine may be used for other purposes; ask your health care provider or pharmacist if you have questions. COMMON BRAND NAME(S): Xarelto, Xarelto Starter Pack What should I tell my health care provider before I take this medicine? They need to know if you have any of these conditions: -bleeding disorders -bleeding in the brain -blood in your stools (black or tarry stools) or if you have blood in your vomit -history of stomach bleeding -kidney disease -liver disease -low blood counts, like low white cell, platelet, or red cell counts -recent or planned spinal or epidural procedure -take medicines that treat or prevent blood clots -an unusual or allergic reaction to rivaroxaban, other medicines, foods, dyes, or preservatives -pregnant or trying to get pregnant -breast-feeding How should I use this medicine? Take this medicine by mouth with a glass of water. Follow the directions on the prescription label. Take your medicine at regular intervals. Do not take it more often than directed. Do not stop taking except on your doctor's advice. Stopping this medicine may increase your risk of a blood clot. Be sure to refill your prescription before you run out of medicine. If you are taking  this medicine after hip or knee replacement surgery, take it with or without food. If you are taking this medicine for atrial fibrillation, take it with your evening meal. If you are taking this medicine to treat blood clots, take it with food at the same time each day. If you are unable to swallow your tablet, you may crush the tablet and mix it in applesauce. Then, immediately eat the applesauce. You should eat more food right after you eat the applesauce containing the crushed tablet. Talk to your pediatrician regarding the use of this medicine in children. Special care may be needed. Overdosage: If you think you have taken too much of this medicine contact a poison control center or emergency room at once. NOTE: This medicine is only for you. Do not share this medicine with others. What if I miss a dose? If you take your medicine once a day and miss a dose, take the missed dose as soon as you remember. If you take your medicine twice a day and miss a dose, take the missed dose immediately. In this instance, 2 tablets may be taken at the same time. The next day you should take 1 tablet twice a day as directed. What may interact with this medicine? Do not take this medicine with any of the following medications: -defibrotide This medicine may also interact with the following medications: -aspirin and aspirin-like medicines -certain antibiotics like erythromycin, azithromycin, and clarithromycin -certain medicines for fungal infections like ketoconazole and itraconazole -certain  medicines for irregular heart beat like amiodarone, quinidine, dronedarone -certain medicines for seizures like carbamazepine, phenytoin -certain medicines that treat or prevent blood clots like warfarin, enoxaparin, and dalteparin -conivaptan -diltiazem -felodipine -indinavir -lopinavir; ritonavir -NSAIDS, medicines for pain and inflammation, like ibuprofen or naproxen -ranolazine -rifampin -ritonavir -SNRIs,  medicines for depression, like desvenlafaxine, duloxetine, levomilnacipran, venlafaxine -SSRIs, medicines for depression, like citalopram, escitalopram, fluoxetine, fluvoxamine, paroxetine, sertraline -St. John's wort -verapamil This list may not describe all possible interactions. Give your health care provider a list of all the medicines, herbs, non-prescription drugs, or dietary supplements you use. Also tell them if you smoke, drink alcohol, or use illegal drugs. Some items may interact with your medicine. What should I watch for while using this medicine? Visit your doctor or health care professional for regular checks on your progress. Notify your doctor or health care professional and seek emergency treatment if you develop breathing problems; changes in vision; chest pain; severe, sudden headache; pain, swelling, warmth in the leg; trouble speaking; sudden numbness or weakness of the face, arm or leg. These can be signs that your condition has gotten worse. If you are going to have surgery or other procedure, tell your doctor that you are taking this medicine. What side effects may I notice from receiving this medicine? Side effects that you should report to your doctor or health care professional as soon as possible: -allergic reactions like skin rash, itching or hives, swelling of the face, lips, or tongue -back pain -redness, blistering, peeling or loosening of the skin, including inside the mouth -signs and symptoms of bleeding such as bloody or black, tarry stools; red or dark-brown urine; spitting up blood or brown material that looks like coffee grounds; red spots on the skin; unusual bruising or bleeding from the eye, gums, or nose Side effects that usually do not require medical attention (report to your doctor or health care professional if they continue or are bothersome): -dizziness -muscle pain This list may not describe all possible side effects. Call your doctor for medical  advice about side effects. You may report side effects to FDA at 1-800-FDA-1088. Where should I keep my medicine? Keep out of the reach of children. Store at room temperature between 15 and 30 degrees C (59 and 86 degrees F). Throw away any unused medicine after the expiration date. NOTE: This sheet is a summary. It may not cover all possible information. If you have questions about this medicine, talk to your doctor, pharmacist, or health care provider.  2018 Elsevier/Gold Standard (2016-04-29 16:29:33)

## 2017-05-06 LAB — URINALYSIS, COMPLETE
Bilirubin, UA: NEGATIVE
Glucose, UA: NEGATIVE
KETONES UA: NEGATIVE
NITRITE UA: NEGATIVE
PROTEIN UA: NEGATIVE
RBC, UA: NEGATIVE
SPEC GRAV UA: 1.021 (ref 1.005–1.030)
Urobilinogen, Ur: 0.2 mg/dL (ref 0.2–1.0)
pH, UA: 7 (ref 5.0–7.5)

## 2017-05-06 LAB — CBC WITH DIFFERENTIAL/PLATELET
BASOS ABS: 0 10*3/uL (ref 0.0–0.2)
Basos: 0 %
EOS (ABSOLUTE): 0.1 10*3/uL (ref 0.0–0.4)
Eos: 2 %
Hematocrit: 39.1 % (ref 34.0–46.6)
Hemoglobin: 12.3 g/dL (ref 11.1–15.9)
IMMATURE GRANS (ABS): 0 10*3/uL (ref 0.0–0.1)
Immature Granulocytes: 0 %
LYMPHS: 26 %
Lymphocytes Absolute: 1.4 10*3/uL (ref 0.7–3.1)
MCH: 30.3 pg (ref 26.6–33.0)
MCHC: 31.5 g/dL (ref 31.5–35.7)
MCV: 96 fL (ref 79–97)
MONOS ABS: 0.4 10*3/uL (ref 0.1–0.9)
Monocytes: 7 %
NEUTROS ABS: 3.5 10*3/uL (ref 1.4–7.0)
NEUTROS PCT: 65 %
PLATELETS: 192 10*3/uL (ref 150–379)
RBC: 4.06 x10E6/uL (ref 3.77–5.28)
RDW: 14 % (ref 12.3–15.4)
WBC: 5.3 10*3/uL (ref 3.4–10.8)

## 2017-05-06 LAB — BMP8+ANION GAP
ANION GAP: 14 mmol/L (ref 10.0–18.0)
BUN/Creatinine Ratio: 12 (ref 12–28)
BUN: 16 mg/dL (ref 8–27)
CHLORIDE: 97 mmol/L (ref 96–106)
CO2: 32 mmol/L — ABNORMAL HIGH (ref 20–29)
Calcium: 9.8 mg/dL (ref 8.7–10.3)
Creatinine, Ser: 1.3 mg/dL — ABNORMAL HIGH (ref 0.57–1.00)
GFR calc non Af Amer: 44 mL/min/{1.73_m2} — ABNORMAL LOW (ref >59)
GFR, EST AFRICAN AMERICAN: 50 mL/min/{1.73_m2} — AB (ref >59)
GLUCOSE: 109 mg/dL — AB (ref 65–99)
POTASSIUM: 3.8 mmol/L (ref 3.5–5.2)
Sodium: 143 mmol/L (ref 134–144)

## 2017-05-06 LAB — MICROSCOPIC EXAMINATION: Casts: NONE SEEN /lpf

## 2017-05-06 NOTE — Assessment & Plan Note (Addendum)
This seems consistent with what she is describing today, though she attributes the symptoms to her kidneys.  I checked a UA and it had few bacteria and few white cells, no nitrates.  I will send culture if able to rule out infection.  Dr. Charlynn Grimes at last visit was concerned for renal cysts as well.  I think her feelings of fatigue and weakness are better attributed to her recovery from pansinusitis.   Advised follow up with MRI of kidneys.

## 2017-05-06 NOTE — Assessment & Plan Note (Signed)
She has been getting the low dose inhalers and running out because she is using more during the wet weather.  She is worried about continuing to run out.   Plan Increase albuterol inhaler to 18g inhaler.  Continue symbicort.

## 2017-05-06 NOTE — Assessment & Plan Note (Signed)
Based on symptoms, she has stopped her Eliquis.  We discussed the reasons for being on a blood thinner and following up with cardiology with her abnormal rhythm.  I discussed her stroke risk (rated at at least 5 by the cardiology team) and reasons for being on a DOAC.  She was willing to try a different DOAC.   Plan Continue coreg and diltiazem - HR 80 today and regular on exam Start Xarelto 20mg  daily (calculated CrCl at 71)

## 2017-05-06 NOTE — Assessment & Plan Note (Signed)
She has not taken her citalopram because she wasn't sure what it was for.  I explained to her the use of this medication and how it could decrease her symptoms.   Plan Restart citalopram at 10mg  daily.

## 2017-05-06 NOTE — Assessment & Plan Note (Signed)
BP today was elevated.  We discussed at length.  She recently had her nifedipine increased to 90mg .  She reports to me that she is checking her BP at home and it is sometimes running low in the 34J systolic.  She did not bring a blood pressure log in today.  She will sometimes skip doses.  It is very difficult to tell what her actual baseline blood pressure is.  I think if she brought in her blood pressure log this would be helpful.   Plan Bring in blood pressure log Continue current medications of coreg, diltiazem, losartan and nifedipine.

## 2017-05-06 NOTE — Assessment & Plan Note (Signed)
Again reminded her to schedule MRI.  I think her organizational skills may not allow her to remember this and she may need frequent reminders.

## 2017-05-06 NOTE — Assessment & Plan Note (Signed)
Has been stable.  She has pain reported in her kidneys, but based on exam and her report, I think she more likely is having back spasms.  It was difficult to pin down her symptoms as she had many varied symptoms she was attributing her kidneys and fat accumulation.  I will check her renal function and a UA today.  I advised her to continue water intake at her current level.

## 2017-05-07 ENCOUNTER — Ambulatory Visit: Payer: Medicare Other | Admitting: Adult Health

## 2017-05-12 ENCOUNTER — Ambulatory Visit (INDEPENDENT_AMBULATORY_CARE_PROVIDER_SITE_OTHER): Payer: Medicare Other | Admitting: Internal Medicine

## 2017-05-12 VITALS — BP 169/112 | HR 87 | Temp 97.5°F | Wt 224.0 lb

## 2017-05-12 DIAGNOSIS — E881 Lipodystrophy, not elsewhere classified: Secondary | ICD-10-CM

## 2017-05-12 DIAGNOSIS — R0609 Other forms of dyspnea: Secondary | ICD-10-CM

## 2017-05-12 DIAGNOSIS — N183 Chronic kidney disease, stage 3 unspecified: Secondary | ICD-10-CM

## 2017-05-12 DIAGNOSIS — I10 Essential (primary) hypertension: Secondary | ICD-10-CM

## 2017-05-12 DIAGNOSIS — B20 Human immunodeficiency virus [HIV] disease: Secondary | ICD-10-CM | POA: Diagnosis not present

## 2017-05-12 NOTE — Progress Notes (Signed)
Patient ID: Shannon [name], female   DOB: 12-Jun-1956, 61 y.o.   MRN: [id number]  HPI Shannon [name] is a 60yo F who has well controlled long standing hiv disease, CD 4 count of 730/VL<20 in April 2018 with descovy-tivicay. Shannon [name] states that she feels that she has increasing shortness of breath, now requiring supplemental oxygen with exertion not just with evenings. She subscribes to [facility] but not necessarily orthopnea. She is fixated that she feels her core weight/central obesity is causing difficulty with her breathing, as well as kidney function  Tracking back her kidney function has fluctuated from 1.1-1.4 over the last 2 years but most recently has been at 1.3. Recent ua does not show any protein  Outpatient Encounter Prescriptions as of 05/12/2017  Medication Sig  . acetaminophen (TYLENOL) 500 MG tablet Take 500 mg by mouth every 6 (six) hours as needed for mild pain.   [name] albuterol (PROAIR HFA) 108 (90 Base) MCG/ACT inhaler Inhale 2 puffs into the lungs every 4 (four) hours as needed for wheezing or shortness of breath.  [name] albuterol (PROVENTIL) (2.5 MG/3ML) 0.083% nebulizer solution Take 3 mLs (2.5 mg total) by nebulization every 6 (six) hours as needed for wheezing or shortness of breath.  . carvedilol (COREG) 25 MG tablet Take 0.5 tablets (12.5 mg total) by mouth daily.  . citalopram (CELEXA) 10 MG tablet Take 10 mg by mouth daily.  . cyanocobalamin (,VITAMIN B-12,) 1000 MCG/ML injection Inject 1 mL (1,000 mcg total) into the muscle every 30 (thirty) days.  . DESCOVY 200-25 MG tablet TAKE 1 TABLET BY MOUTH DAILY  . diltiazem (CARDIZEM SR) 60 MG 12 hr capsule Take 1 capsule (60 mg total) by mouth 2 (two) times daily.  [name] doxycycline (VIBRA-TABS) 100 MG tablet Take 1 tablet (100 mg total) by mouth 2 (two) times daily.  . fluticasone (FLONASE) 50 MCG/ACT nasal spray Place 2 sprays into both nostrils daily. (Patient taking differently: Place 2 sprays into both nostrils daily as needed for  allergies. )  . loratadine (CLARITIN) 10 MG tablet Take 1 tablet (10 mg total) by mouth daily as needed for allergies.  [name] losartan (COZAAR) 100 MG tablet Take 100 mg by mouth daily. hypertension  . Multiple Vitamin (MULTIVITAMIN WITH MINERALS) TABS tablet Take 1 tablet by mouth daily. (Patient taking differently: Take 1 tablet by mouth every other day. )  . NIFEdipine (ADALAT CC) 90 MG 24 hr tablet Take 1 tablet (90 mg total) by mouth daily.  . OXYGEN Inhale 2.5-3 L into the lungs at bedtime as needed (Shortness of Breath). 2.5 to 3 liters at night and as needed in the daytime.   . sodium chloride (OCEAN) 0.65 % SOLN nasal spray Place 1 spray into both nostrils as needed for congestion.  . Tetrahydrozoline HCl (VISINE OP) Place 2 drops into both eyes daily as needed (for dry eyes).   [name] TIVICAY 50 MG tablet TAKE 1 TABLET BY MOUTH DAILY  . budesonide-formoterol (SYMBICORT) 80-4.5 MCG/ACT inhaler Inhale 2 puffs into the lungs 2 (two) times daily. (Patient not taking: Reported on 05/12/2017)  . rivaroxaban (XARELTO) 20 MG TABS tablet Take 1 tablet (20 mg total) by mouth daily with supper. (Patient not taking: Reported on 05/12/2017)   No facility-administered encounter medications on file as of 05/12/2017.      Patient Active Problem List   Diagnosis Date Noted  . Right flank pain 05/05/2017  . Atrial flutter ([place]) 03/11/2017  . CAD (coronary artery disease) 01/28/2017  .  Morbid obesity with BMI of 40.0-44.9, adult ([place]) 01/13/2017  . On home oxygen therapy 01/13/2017  . Ascending aorta dilatation ([facility]) 01/07/2017  . Low back pain radiating to right lower extremity 12/02/2016  . Bilateral renal cysts 12/02/2016  . Chronic kidney disease (CKD), stage III (moderate) 12/01/2016  . Generalized anxiety disorder 10/22/2016  . Hypersomnia 10/01/2016  . BPPV (benign paroxysmal positional vertigo) 09/18/2016  . Nocturnal hypoxemia 05/13/2016  . Cervical radiculopathy 04/14/2016  . GERD  (gastroesophageal reflux disease) 01/02/2016  . Vitamin B12 deficiency 10/02/2015  . Inappropriate diet and eating habits 08/15/2015  . Opacity noted on imaging study   . Dyspnea 07/09/2015  . Asthma, moderate persistent 07/09/2015  . HIV disease ([place]) 07/09/2015  . Essential hypertension 07/09/2015  . Mild diastolic dysfunction 24/40/1027     Health Maintenance Due  Topic Date Due  . TETANUS/TDAP  03/03/1973  . COLONOSCOPY  03/03/2004  . INFLUENZA VACCINE  03/24/2017     Review of Systems + shortness of breath, no wheezing, no cough, +/- orthopnea Physical Exam   BP (!) 169/112   Pulse 87   Temp (!) 97.5 F (36.4 C) (Oral)   Wt 224 lb (101.6 kg)   BMI 39.68 kg/m   Physical Exam  Constitutional:  oriented to person, place, and time. appears well-developed and well-nourished. No distress.  HENT: [place]/AT, PERRLA, no scleral icterus Mouth/Throat: Oropharynx is clear and moist. No oropharyngeal exudate.  Cardiovascular: Normal rate, regular rhythm and normal heart sounds. Exam reveals no gallop and no friction rub.  No murmur heard.  Pulmonary/Chest: Effort normal and breath sounds normal. No respiratory distress.  has no wheezes.  Neck = supple, no nuchal rigidity Abdominal: Soft. Bowel sounds are normal.  exhibits no distension. There is no tenderness.  Lymphadenopathy: no cervical adenopathy. No axillary adenopathy Neurological: alert and oriented to person, place, and time.  Ext: muscle wasting on extremities Skin: Skin is warm and dry. No rash noted. No erythema.  Psychiatric: a normal mood and affect.  behavior is normal.   Lab Results  Component Value Date   CD4TCELL 34 11/26/2016   Lab Results  Component Value Date   CD4TABS 730 11/26/2016   CD4TABS 640 08/12/2016   CD4TABS 510 03/31/2016   Lab Results  Component Value Date   HIV1RNAQUANT <20 DETECTED (A) 11/26/2016   No results found for: HEPBSAB Lab Results  Component Value Date   LABRPR Non Reactive  11/30/2016    CBC Lab Results  Component Value Date   WBC 5.3 05/05/2017   RBC 4.06 05/05/2017   HGB 12.3 05/05/2017   HCT 39.1 05/05/2017   PLT 192 05/05/2017   MCV 96 05/05/2017   MCH 30.3 05/05/2017   MCHC 31.5 05/05/2017   RDW 14.0 05/05/2017   LYMPHSABS 1.4 05/05/2017   MONOABS 0.4 01/28/2017   EOSABS 0.1 05/05/2017    BMET Lab Results  Component Value Date   NA 143 05/05/2017   K 3.8 05/05/2017   CL 97 05/05/2017   CO2 32 (H) 05/05/2017   GLUCOSE 109 (H) 05/05/2017   BUN 16 05/05/2017   CREATININE 1.30 (H) 05/05/2017   CALCIUM 9.8 05/05/2017   GFRNONAA 44 (L) 05/05/2017   GFRAA 50 (L) 05/05/2017      Assessment and Plan   - hiv disease = continue on tivicay and descovy.   ckd 3 = her current hiv regimen is renal sparing thus I don't suspect that her latest cr which has been stable since may  is due to her hiv meds. She is however on some medications for chf which   [facility] =she has upcoming appt with dr. Elsworth Soho to assess if any of her pulmonary changes could account for her symptoms. I still recommend that she follows up with her cardiologist  Central obesity = could be multifactorial diet and medication related. in the past, older regimens for hiv did cause lipodystrophy. Would like to see if cardiology or pulmonary make any changes to her medications that provide relief

## 2017-05-13 ENCOUNTER — Ambulatory Visit (INDEPENDENT_AMBULATORY_CARE_PROVIDER_SITE_OTHER): Payer: Medicare Other | Admitting: Adult Health

## 2017-05-13 ENCOUNTER — Encounter: Payer: Self-pay | Admitting: Adult Health

## 2017-05-13 ENCOUNTER — Ambulatory Visit: Payer: Medicare Other | Admitting: Adult Health

## 2017-05-13 DIAGNOSIS — J301 Allergic rhinitis due to pollen: Secondary | ICD-10-CM

## 2017-05-13 DIAGNOSIS — J4540 Moderate persistent asthma, uncomplicated: Secondary | ICD-10-CM

## 2017-05-13 DIAGNOSIS — G4734 Idiopathic sleep related nonobstructive alveolar hypoventilation: Secondary | ICD-10-CM

## 2017-05-13 DIAGNOSIS — G4733 Obstructive sleep apnea (adult) (pediatric): Secondary | ICD-10-CM | POA: Diagnosis not present

## 2017-05-13 DIAGNOSIS — J189 Pneumonia, unspecified organism: Secondary | ICD-10-CM | POA: Diagnosis not present

## 2017-05-13 LAB — T-HELPER CELL (CD4) - (RCID CLINIC ONLY)
CD4 T CELL HELPER: 39 % (ref 33–55)
CD4 T Cell Abs: 620 /uL (ref 400–2700)

## 2017-05-13 NOTE — Patient Instructions (Addendum)
Restart Symbicort 160 2 puffs Twice daily  , rinse after use .  Saline nasal rinse As needed   Continue on Flonase 2 puff daily .  Continue on Zyrtec 10mg  At bedtime  .  follow up Dr. Elsworth Soho  In 1 months and As needed Cont on o2 At bedtime  .  Please contact office for sooner follow up if symptoms do not improve or worsen or seek emergency care

## 2017-05-13 NOTE — Progress Notes (Signed)
@Patient  ID: Shannon [name], female    DOB: [birth year], 61 y.o.   MRN: [id number]  Chief Complaint  Patient presents with  . Follow-up    Asthma    Referring provider: [name], MD  HPI: 61 year old female never smoker followed for chronic severe asthma and nocturnal hypoxemia on O2 1-2 L/m .  HIV dz f/by ID clinic   .TEST  CTa chest 12/2016 , 01/2017 >neg for PE , multifocal scarring bilaterally PFT (12/04/15)  FEV1/FVC 50%,  FEV1  0.64  31%. +++ BD response. DLCO 66%. Sleep study in 04/2016 was (-) for OSA  05/13/17  Follow up ; Asthma  Pt returns for 4 month follow up for asthma .Says her breathing is not doing well. She gets sob easily and has intermittent dry cough and wheezing. She does not take Synmbicort . We discussed that she has severe asthma and the need for medication to control  This . Despite multiple attempts to explain her disease she argued that chronic respiratory disease was different from lung disease. She also says she does not want to take a steroid or meds that can raise her b/p or heart rate.  Pt education given on ICS , asthma control . HIV meds and ICS , and oral steroids, complications of uncontrolled asthma.    Allergies  Allergen Reactions  . Tree Extract Swelling and Other (See Comments)    Swelling to eyes  . Ciprofloxacin Hives    There is no immunization history for the selected administration types on file for this patient.  Past Medical History:  Diagnosis Date  . Anemia   . Anxiety    HX PANIC ATTACKS  . Arthritis    "starting to; in my hands" (07/09/2015)  . Chronic asthma with acute exacerbation    "I have chronic asthma all the time; sometimes exacerbations" (07/09/2015)  . Chronic lower back pain   . Cyst of right kidney    "3 of them; dx'd in ~ 01/2015"  . GERD (gastroesophageal reflux disease)   . Heart murmur   . History of blood transfusion    "related to my brain surgery I think"  . History of pulmonary embolism  07/09/2015  . HIV antibody positive ([place])   . HIV disease ([place])   . Hyperlipidemia   . Hypertension   . Lipodystrophy   . Pneumonia 07/09/2015  . Shingles   . Sleep apnea    "never completed part 2 of study; never wore mask" (07/09/2015)    Tobacco History: History  Smoking Status  . Never Smoker  Smokeless Tobacco  . Never Used   Counseling given: Not Answered   Outpatient Encounter Prescriptions as of 05/13/2017  Medication Sig  . acetaminophen (TYLENOL) 500 MG tablet Take 500 mg by mouth every 6 (six) hours as needed for mild pain.   [name] albuterol (PROAIR HFA) 108 (90 Base) MCG/ACT inhaler Inhale 2 puffs into the lungs every 4 (four) hours as needed for wheezing or shortness of breath.  [name] albuterol (PROVENTIL) (2.5 MG/3ML) 0.083% nebulizer solution Take 3 mLs (2.5 mg total) by nebulization every 6 (six) hours as needed for wheezing or shortness of breath.  . budesonide-formoterol (SYMBICORT) 80-4.5 MCG/ACT inhaler Inhale 2 puffs into the lungs 2 (two) times daily. (Patient not taking: Reported on 05/12/2017)  . carvedilol (COREG) 25 MG tablet Take 0.5 tablets (12.5 mg total) by mouth daily.  . citalopram (CELEXA) 10 MG tablet Take 10 mg by mouth daily.  [name]  cyanocobalamin (,VITAMIN B-12,) 1000 MCG/ML injection Inject 1 mL (1,000 mcg total) into the muscle every 30 (thirty) days.  . DESCOVY 200-25 MG tablet TAKE 1 TABLET BY MOUTH DAILY  . diltiazem (CARDIZEM SR) 60 MG 12 hr capsule Take 1 capsule (60 mg total) by mouth 2 (two) times daily.  [name] doxycycline (VIBRA-TABS) 100 MG tablet Take 1 tablet (100 mg total) by mouth 2 (two) times daily.  . fluticasone (FLONASE) 50 MCG/ACT nasal spray Place 2 sprays into both nostrils daily. (Patient taking differently: Place 2 sprays into both nostrils daily as needed for allergies. )  . loratadine (CLARITIN) 10 MG tablet Take 1 tablet (10 mg total) by mouth daily as needed for allergies.  [name] losartan (COZAAR) 100 MG tablet Take 100 mg by mouth  daily. hypertension  . Multiple Vitamin (MULTIVITAMIN WITH MINERALS) TABS tablet Take 1 tablet by mouth daily. (Patient taking differently: Take 1 tablet by mouth every other day. )  . NIFEdipine (ADALAT CC) 90 MG 24 hr tablet Take 1 tablet (90 mg total) by mouth daily.  . OXYGEN Inhale 2.5-3 L into the lungs at bedtime as needed (Shortness of Breath). 2.5 to 3 liters at night and as needed in the daytime.   . rivaroxaban (XARELTO) 20 MG TABS tablet Take 1 tablet (20 mg total) by mouth daily with supper. (Patient not taking: Reported on 05/12/2017)  . sodium chloride (OCEAN) 0.65 % SOLN nasal spray Place 1 spray into both nostrils as needed for congestion.  . Tetrahydrozoline HCl (VISINE OP) Place 2 drops into both eyes daily as needed (for dry eyes).   [name] TIVICAY 50 MG tablet TAKE 1 TABLET BY MOUTH DAILY   No facility-administered encounter medications on file as of 05/13/2017.      Review of Systems  Constitutional:   No  weight loss, night sweats,  Fevers, chills,  +fatigue, or  lassitude.  HEENT:   No headaches,  Difficulty swallowing,  Tooth/dental problems, or  Sore throat,                No sneezing, itching, ear ache,  +nasal congestion, post nasal drip,   CV:  No chest pain,  Orthopnea, PND, swelling in lower extremities, anasarca, dizziness, palpitations, syncope.   GI  No heartburn, indigestion, abdominal pain, nausea, vomiting, diarrhea, change in bowel habits, loss of appetite, bloody stools.   Resp:    No chest wall deformity  Skin: no rash or lesions.  GU: no dysuria, change in color of urine, no urgency or frequency.  No flank pain, no hematuria   MS:  No joint pain or swelling.  No decreased range of motion.  No back pain.    Physical Exam  BP 126/84 (BP Location: Left Arm, Cuff Size: Normal)   Pulse 86   Ht 5\' 3"  (1.6 m)   Wt 224 lb (101.6 kg)   SpO2 94%   BMI 39.68 kg/m   GEN: A/Ox3; pleasant , NAD, well nourished    HEENT:  [place]/AT,  EACs-clear, TMs-wnl,  NOSE-clear, THROAT-clear, no lesions, no postnasal drip or exudate noted.   NECK:  Supple w/ fair ROM; no JVD; normal carotid impulses w/o bruits; no thyromegaly or nodules palpated; no lymphadenopathy.    RESP  Clear  P & A; w/o, wheezes/ rales/ or rhonchi. no accessory muscle use, no dullness to percussion  CARD:  RRR, no m/r/g, no peripheral edema, pulses intact, no cyanosis or clubbing.  GI:   Soft & nt; nml bowel  sounds; no organomegaly or masses detected.   Musco: Warm bil, no deformities or joint swelling noted.   Neuro: alert, no focal deficits noted.    Skin: Warm, no lesions or rashes    BMET  ProBNP No results found for: PROBNP  Imaging: No results found.   Assessment & Plan:   Asthma, moderate persistent Severe Chronic obstructive asthma  Uncontrolled due to medication non compliance  Went over in detail asthma treatment options along with potential complications of untreated asthma .  Pt education on meds  Discussed alternatives to Symbicort however she wants to stay with symbicort for now   Plan  . Patient Instructions  Restart Symbicort 160 2 puffs Twice daily  , rinse after use .  Saline nasal rinse As needed   Continue on Flonase 2 puff daily .  Continue on Zyrtec 10mg  At bedtime  .  follow up Dr. Elsworth Soho  In 1 months and As needed Cont on o2 At bedtime  .  Please contact office for sooner follow up if symptoms do not improve or worsen or seek emergency care       Nocturnal hypoxemia Cont on o2 At bedtime    Allergic rhinitis Trigger control  Cont on current regimen  Plan  . Patient Instructions  Restart Symbicort 160 2 puffs Twice daily  , rinse after use .  Saline nasal rinse As needed   Continue on Flonase 2 puff daily .  Continue on Zyrtec 10mg  At bedtime  .  follow up Dr. Elsworth Soho  In 1 months and As needed Cont on o2 At bedtime  .  Please contact office for sooner follow up if symptoms do not improve or worsen or seek emergency care          Rexene Edison, NP

## 2017-05-14 ENCOUNTER — Ambulatory Visit: Payer: Medicare Other | Admitting: Cardiovascular Disease

## 2017-05-14 DIAGNOSIS — J309 Allergic rhinitis, unspecified: Secondary | ICD-10-CM | POA: Insufficient documentation

## 2017-05-14 NOTE — Assessment & Plan Note (Signed)
Cont on o2 At bedtime

## 2017-05-14 NOTE — Assessment & Plan Note (Signed)
Severe Chronic obstructive asthma  Uncontrolled due to medication non compliance  Went over in detail asthma treatment options along with potential complications of untreated asthma .  Pt education on meds  Discussed alternatives to Symbicort however she wants to stay with symbicort for now   Plan  . Patient Instructions  Restart Symbicort 160 2 puffs Twice daily  , rinse after use .  Saline nasal rinse As needed   Continue on Flonase 2 puff daily .  Continue on Zyrtec 10mg  At bedtime  .  follow up Dr. Elsworth Soho  In 1 months and As needed Cont on o2 At bedtime  .  Please contact office for sooner follow up if symptoms do not improve or worsen or seek emergency care

## 2017-05-14 NOTE — Assessment & Plan Note (Signed)
Trigger control  Cont on current regimen  Plan  . Patient Instructions  Restart Symbicort 160 2 puffs Twice daily  , rinse after use .  Saline nasal rinse As needed   Continue on Flonase 2 puff daily .  Continue on Zyrtec 10mg  At bedtime  .  follow up Dr. Elsworth Soho  In 1 months and As needed Cont on o2 At bedtime  .  Please contact office for sooner follow up if symptoms do not improve or worsen or seek emergency care

## 2017-05-14 NOTE — Progress Notes (Deleted)
Cardiology Office Note   Date:  05/14/2017   ID:  Shannon [name], DOB 08-08-1956, MRN [id number]  PCP:  [name], MD  Cardiologist:   Skeet Latch, MD  ID: [name], MD  No chief complaint on file.    History of Present Illness: Shannon [name] is a 61 y.o. female with hypertension, paroxysmal atrial flutter, chronic severe asthma, non-obstructive CAD, OSA, prior PE, moderate pulmonary hypertension, mild ascending aorta aneurysm, and HIV (well-controlled)  who presents for follow up.  Shannon [name] was initially referred by her infectious disease doctor, [name], MD, on 12/26/15. [name] was concerned about her lower extremity edema and shortness of breath.  She was started on lasix 20 mg every other day.  Shannon [name] noted shortness of breath since her hospitalization for pneumonia 06/2015.   She denied lower extremity edema or orthopnea but did report chest pain when laying down at night.  She was referred for an echo 01/2016 that revealed LVEF 60-65% awith grade 1 diastolic dysfunction and hypokinesis of the basal inferior wall.  She also had very mild aortic stenosis with a mean gradient of 9 mmHg and trivial AR.  PASP was 52 mmHg.  She had a Lexiscan Myoview at that time that was negative for ischemia.  Shannon [name] previously had a Lexiscan Cardiolite in September 2013 that revealed a medium, mild reversible defect in the anterior wall and normal systolic function. She subsequently underwent cardiac catheterization that revealed nonobstructive coronary disease. She had mild pulmonary hypertension and a normal cardiac output. Left ventriculography revealed mild to moderate aortic regurgitation. She had an echo May 2016 that revealed an ejection fraction of 62% and mild diastolic dysfunction. It also revealed calcification of the aortic valve, mild to moderate aortic regurgitation, and mild aortic stenosis. Peak velocity was 2.1 m/s. She had a 24-hour Holter in February 2015 that  revealed rare PACs and PVCs.  Echo 01/06/17 revealed LVEF 60-65% with mild LVH and a mean aortic valve gradient of 8 mmHg.  Tricuspid regurgitation was not significant enough to assess pulmonary pressures  A nifedipine was increased to 90 mg daily.t her last appointment Shannon [name]'s BP was better-controlled but still above goal.   BP has been labile.  She was seen in the emergency department 01/2017 and found to be in atrial flutter at a rate of 150 bpm.  She was cardioverted with oral flecainide. She was started on diltiazem and Eliquis.     F/u lipids OSA  Past Medical History:  Diagnosis Date  . Anemia   . Anxiety    HX PANIC ATTACKS  . Arthritis    "starting to; in my hands" (07/09/2015)  . Chronic asthma with acute exacerbation    "I have chronic asthma all the time; sometimes exacerbations" (07/09/2015)  . Chronic lower back pain   . Cyst of right kidney    "3 of them; dx'd in ~ 01/2015"  . GERD (gastroesophageal reflux disease)   . Heart murmur   . History of blood transfusion    "related to my brain surgery I think"  . History of pulmonary embolism 07/09/2015  . HIV antibody positive ([place])   . HIV disease ([place])   . Hyperlipidemia   . Hypertension   . Lipodystrophy   . Pneumonia 07/09/2015  . Shingles   . Sleep apnea    "never completed part 2 of study; never wore mask" (07/09/2015)    Past Surgical History:  Procedure Laterality Date  .  ABDOMINAL HYSTERECTOMY     "robotic laparosopic"  . BRAIN SURGERY  1974   "brain tumor; benign; on top of my brain; got a plate in there"  . CARDIAC CATHETERIZATION    . TONSILLECTOMY AND ADENOIDECTOMY       Current Outpatient Prescriptions  Medication Sig Dispense Refill  . acetaminophen (TYLENOL) 500 MG tablet Take 500 mg by mouth every 6 (six) hours as needed for mild pain.     [name] albuterol (PROAIR HFA) 108 (90 Base) MCG/ACT inhaler Inhale 2 puffs into the lungs every 4 (four) hours as needed for wheezing or shortness of breath.  18 g 3  . albuterol (PROVENTIL) (2.5 MG/3ML) 0.083% nebulizer solution Take 3 mLs (2.5 mg total) by nebulization every 6 (six) hours as needed for wheezing or shortness of breath. 75 mL 3  . budesonide-formoterol (SYMBICORT) 80-4.5 MCG/ACT inhaler Inhale 2 puffs into the lungs 2 (two) times daily. (Patient not taking: Reported on 05/12/2017) 1 Inhaler 12  . carvedilol (COREG) 25 MG tablet Take 0.5 tablets (12.5 mg total) by mouth daily. 30 tablet 2  . citalopram (CELEXA) 10 MG tablet Take 10 mg by mouth daily.    . cyanocobalamin (,VITAMIN B-12,) 1000 MCG/ML injection Inject 1 mL (1,000 mcg total) into the muscle every 30 (thirty) days. 1 mL 6  . DESCOVY 200-25 MG tablet TAKE 1 TABLET BY MOUTH DAILY 30 tablet 5  . diltiazem (CARDIZEM SR) 60 MG 12 hr capsule Take 1 capsule (60 mg total) by mouth 2 (two) times daily. 60 capsule 2  . doxycycline (VIBRA-TABS) 100 MG tablet Take 1 tablet (100 mg total) by mouth 2 (two) times daily. 20 tablet 0  . fluticasone (FLONASE) 50 MCG/ACT nasal spray Place 2 sprays into both nostrils daily. (Patient taking differently: Place 2 sprays into both nostrils daily as needed for allergies. ) 16 g 5  . loratadine (CLARITIN) 10 MG tablet Take 1 tablet (10 mg total) by mouth daily as needed for allergies. 30 tablet 2  . losartan (COZAAR) 100 MG tablet Take 100 mg by mouth daily. hypertension  1  . Multiple Vitamin (MULTIVITAMIN WITH MINERALS) TABS tablet Take 1 tablet by mouth daily. (Patient taking differently: Take 1 tablet by mouth every other day. ) 90 tablet   . NIFEdipine (ADALAT CC) 90 MG 24 hr tablet Take 1 tablet (90 mg total) by mouth daily. 30 tablet 5  . OXYGEN Inhale 2.5-3 L into the lungs at bedtime as needed (Shortness of Breath). 2.5 to 3 liters at night and as needed in the daytime.     . rivaroxaban (XARELTO) 20 MG TABS tablet Take 1 tablet (20 mg total) by mouth daily with supper. (Patient not taking: Reported on 05/12/2017) 30 tablet 6  . sodium chloride  (OCEAN) 0.65 % SOLN nasal spray Place 1 spray into both nostrils as needed for congestion. 30 mL 1  . Tetrahydrozoline HCl (VISINE OP) Place 2 drops into both eyes daily as needed (for dry eyes).     [name] TIVICAY 50 MG tablet TAKE 1 TABLET BY MOUTH DAILY 30 tablet 5   No current facility-administered medications for this visit.     Allergies:   Tree extract and Ciprofloxacin    Social History:  The patient  reports that she has never smoked. She has never used smokeless tobacco. She reports that she drinks alcohol. She reports that she does not use drugs.   Family History:  The patient's family history includes Asthma in her mother;  Diabetes in her sister; Heart failure in her mother; Heart murmur in her brother, sister, and sister; Thyroid disease in her sister.    ROS:  Please see the history of present illness.   Otherwise, review of systems are positive for none   All other systems are reviewed and negative.   PHYSICAL EXAM: VS:  There were no vitals taken for this visit. , BMI There is no height or weight on file to calculate BMI. GENERAL:  Well appearing.  Mild distress. HEENT:  Pupils equal round and reactive, fundi not visualized, oral mucosa unremarkable NECK:  No jugular venous distention, waveform within normal limits, carotid upstroke brisk and symmetric, no bruits LUNGS:  Diminished air movement.  No wheezes, rhonchi or crackles.   HEART:  RRR.  PMI not displaced or sustained,S1 and S2 within normal limits, no S3, no S4, no clicks, no rubs, II/VI early-peaking systolic murmur at the right upper sternal border. I/IV diastolic murmur at the left upper sternal border.  ABD:  Central adiposity. Positive bowel sounds normal in frequency in pitch, no bruits, no rebound, no guarding, no midline pulsatile mass, no hepatomegaly, no splenomegaly EXT:  2 plus pulses throughout, no edema, no cyanosis no clubbing SKIN:  No rashes no nodules.  5-6 erythematous pustules on lateral L leg. NEURO:   Cranial nerves II through XII grossly intact, motor grossly intact throughout PSYCH:  Cognitively intact, oriented to person place and time.  Tangential.   EKG:  EKG is not ordered today. The ekg ordered 12/31/15 shows sinus rhythm. Rate 89 bpm. 2 PVCs. 10/28/16: Sinus rhythm. Rate 93 bpm  Coronary angiography 06/01/12: 10-20% OM, diffuse 20-30% LAD, 10-20% D1, 20% diffuse RCA RA 10, RV 39/12, RV EDP 12, capillary wedge pressure 19, PA 39/21, mean 27, LV 116/14, LVEDP 20, aorta 115/77 Cardiac output (Fick) 6.55, cardiac index 3.2 PVR 1.7 with units, SVR 12 with units. She P/QRS ratio 1 LVEF 80%. Aortic root mildly dilated. 1-2+ aortic regurgitation.  24-hour Holter 09/26/13: Sinus rhythm, rare PACs, rare PVCs  Echo 01/22/15: LVEF 70%. Mild LVH. Mild diastolic dysfunction. Normal RV function. Left atrium moderately enlarged. Right atrium mildly enlarged. Trace mitral regurgitation. Calcific aortic valve. Mild to moderate aortic regurgitation. Mild aortic stenosis. Peak velocity 2.1 m/s trace tricuspid regurgitation    Lexiscan Myoview 01/2016:   The left ventricular ejection fraction is normal (55-65%). The EF is 60% visually. The computer generated EF is not calculated correctly and is therefore not reported.  There was no ST segment deviation noted during stress.  The study is normal. no ischemia . no infarction   Echo 01/29/16: Study Conclusions  - Left ventricle: The cavity size was normal. Wall thickness was   increased in a pattern of moderate LVH. Systolic function was   normal. The estimated ejection fraction was in the range of 60%   to 65%. Basal inferior hypokinesis. Doppler parameters are   consistent with abnormal left ventricular relaxation (grade 1   diastolic dysfunction). - Aortic valve: Trileaflet; moderately calcified leaflets. There   was very mild stenosis. There was mild regurgitation. Mean   gradient (S): 9 mm Hg. Valve area (VTI): 1.68 cm^2. - Mitral valve:  There was trivial regurgitation. - Left atrium: The atrium was mildly dilated. - Right ventricle: The cavity size was normal. Systolic function   was normal. - Tricuspid valve: Peak RV-RA gradient (S): 44 mm Hg. - Pulmonary arteries: PA peak pressure: 52 mm Hg (S). - Systemic veins: IVC measured 2.3  cm with > 50% respirophasic   variation, suggesting RA pressure 8 mmHg.  Impressions:  - Normal LV size with moderate LV hypertrophy. EF 60-65%. Basal   inferior hypokinesis. Normal RV size and systolic function. Very   mild aortic stenosis, mild aortic insufficieny. Moderate   pulmonary hypertension.   Recent Labs: 01/28/2017: ALT 21; B Natriuretic Peptide 71.0 05/05/2017: BUN 16; Creatinine, Ser 1.30; Hemoglobin 12.3; Platelets 192; Potassium 3.8; Sodium 143    Lipid Panel    Component Value Date/Time   CHOL 217 (H) 11/26/2016 1618   TRIG 88 11/26/2016 1618   HDL 67 11/26/2016 1618   CHOLHDL 3.2 11/26/2016 1618   VLDL 18 11/26/2016 1618   LDLCALC 132 (H) 11/26/2016 1618      Wt Readings from Last 3 Encounters:  05/13/17 101.6 kg (224 lb)  05/12/17 101.6 kg (224 lb)  05/05/17 101.9 kg (224 lb 11.2 oz)      ASSESSMENT AND PLAN:  # Hypertension:  BP is much better controlled.  It seems that she hadn't been taking medication as prescribed.  Her adherence has improved significantly.  BP remains above goal.  Increase nifedipine to 90 mg daily.  Continue carvedilol and losartan.  Follow up in one month.   # Sinus congestion: Deferred this conversation for her to have with her PCP.  # Aortic regurgitation # Aortic stenosis: Shannon [name] has very mild aortic stenosis (mean gradient 9 mmHg) and mild aortic regurgitation.  Continue to monitor.    # Hyperlipidemia: Will address at follow up.  # Ascending aorta aneurysm: 4.0 on CT 12/2016.  Will re-assess 12/2017.  Current medicines are reviewed at length with the patient today.  The patient does not have concerns regarding  medicines.  The following changes have been made:  Increase nifedipine to 90 mg daily.   Labs/ tests ordered today include:   No orders of the defined types were placed in this encounter.    Disposition:   FU with Shannon Popiel C. Oval Linsey, MD, [facility] in 1 month.  This note was written with the assistance of speech recognition software.  Please excuse any transcriptional errors.  Signed, Robey Massmann C. Oval Linsey, MD, [facility]  05/14/2017 7:37 AM    [facility]

## 2017-05-15 LAB — HIV-1 RNA QUANT-NO REFLEX-BLD
HIV 1 RNA Quant: <20 copies/mL
HIV-1 RNA QUANT, LOG: NOT DETECTED {Log_copies}/mL

## 2017-05-19 ENCOUNTER — Telehealth: Payer: Self-pay | Admitting: Internal Medicine

## 2017-05-19 NOTE — Telephone Encounter (Signed)
Called to speak with Shannon [name] about her urinary symptoms.  These have resolved.  She continues to have back pain which she is convinced is her kidneys acting up.  She is also convinced she has lipodystrophy which is causing her to have 80% body fat.  She does not like to be on too many medications.  She would like to discuss this further at our next visit.  She does not appear to have a visit scheduled.  Will ask the front desk to schedule on in Oct or Nov.

## 2017-05-20 ENCOUNTER — Emergency Department (HOSPITAL_COMMUNITY)
Admission: EM | Admit: 2017-05-20 | Discharge: 2017-05-20 | Disposition: A | Discharge status: Home / Self Care | Payer: Medicare Other | Attending: Emergency Medicine | Admitting: Emergency Medicine

## 2017-05-20 ENCOUNTER — Emergency Department (HOSPITAL_COMMUNITY): Payer: Medicare Other

## 2017-05-20 ENCOUNTER — Encounter (HOSPITAL_COMMUNITY): Payer: Self-pay

## 2017-05-20 DIAGNOSIS — E785 Hyperlipidemia, unspecified: Secondary | ICD-10-CM | POA: Diagnosis not present

## 2017-05-20 DIAGNOSIS — J4541 Moderate persistent asthma with (acute) exacerbation: Secondary | ICD-10-CM

## 2017-05-20 DIAGNOSIS — R0602 Shortness of breath: Secondary | ICD-10-CM | POA: Insufficient documentation

## 2017-05-20 DIAGNOSIS — I129 Hypertensive chronic kidney disease with stage 1 through stage 4 chronic kidney disease, or unspecified chronic kidney disease: Secondary | ICD-10-CM | POA: Diagnosis not present

## 2017-05-20 DIAGNOSIS — I2510 Atherosclerotic heart disease of native coronary artery without angina pectoris: Secondary | ICD-10-CM | POA: Insufficient documentation

## 2017-05-20 DIAGNOSIS — D649 Anemia, unspecified: Secondary | ICD-10-CM | POA: Insufficient documentation

## 2017-05-20 DIAGNOSIS — N183 Chronic kidney disease, stage 3 (moderate): Secondary | ICD-10-CM | POA: Diagnosis not present

## 2017-05-20 DIAGNOSIS — Z79899 Other long term (current) drug therapy: Secondary | ICD-10-CM | POA: Diagnosis not present

## 2017-05-20 DIAGNOSIS — B20 Human immunodeficiency virus [HIV] disease: Secondary | ICD-10-CM | POA: Insufficient documentation

## 2017-05-20 DIAGNOSIS — J4531 Mild persistent asthma with (acute) exacerbation: Secondary | ICD-10-CM | POA: Diagnosis not present

## 2017-05-20 DIAGNOSIS — R0682 Tachypnea, not elsewhere classified: Secondary | ICD-10-CM | POA: Diagnosis not present

## 2017-05-20 DIAGNOSIS — R079 Chest pain, unspecified: Secondary | ICD-10-CM | POA: Diagnosis present

## 2017-05-20 LAB — COMPREHENSIVE METABOLIC PANEL
ALBUMIN: 3.6 g/dL (ref 3.5–5.0)
ALT: 17 U/L (ref 14–54)
ANION GAP: 7 (ref 5–15)
AST: 21 U/L (ref 15–41)
Alkaline Phosphatase: 62 U/L (ref 38–126)
BILIRUBIN TOTAL: 0.7 mg/dL (ref 0.3–1.2)
BUN: 13 mg/dL (ref 6–20)
CALCIUM: 9.5 mg/dL (ref 8.9–10.3)
CHLORIDE: 101 mmol/L (ref 101–111)
CO2: 31 mmol/L (ref 22–32)
Creatinine, Ser: 1.22 mg/dL — ABNORMAL HIGH (ref 0.44–1.00)
GFR calc Af Amer: 53 mL/min — ABNORMAL LOW (ref >60)
GFR calc non Af Amer: 46 mL/min — ABNORMAL LOW (ref >60)
GLUCOSE: 112 mg/dL — AB (ref 65–99)
POTASSIUM: 3.8 mmol/L (ref 3.5–5.1)
SODIUM: 139 mmol/L (ref 135–145)
TOTAL PROTEIN: 6.4 g/dL — AB (ref 6.5–8.1)

## 2017-05-20 LAB — CBC WITH DIFFERENTIAL/PLATELET
BASOS PCT: 0 %
Basophils Absolute: 0 10*3/uL (ref 0.0–0.1)
EOS ABS: 0.1 10*3/uL (ref 0.0–0.7)
Eosinophils Relative: 2 %
HCT: 38.5 % (ref 36.0–46.0)
HEMOGLOBIN: 12.2 g/dL (ref 12.0–15.0)
Lymphocytes Relative: 24 %
Lymphs Abs: 1.4 10*3/uL (ref 0.7–4.0)
MCH: 30 pg (ref 26.0–34.0)
MCHC: 31.7 g/dL (ref 30.0–36.0)
MCV: 94.8 fL (ref 78.0–100.0)
Monocytes Absolute: 0.4 10*3/uL (ref 0.1–1.0)
Monocytes Relative: 8 %
NEUTROS PCT: 66 %
Neutro Abs: 3.9 10*3/uL (ref 1.7–7.7)
Platelets: 177 10*3/uL (ref 150–400)
RBC: 4.06 MIL/uL (ref 3.87–5.11)
RDW: 13 % (ref 11.5–15.5)
WBC: 5.8 10*3/uL (ref 4.0–10.5)

## 2017-05-20 LAB — I-STAT TROPONIN, ED: TROPONIN I, POC: 0 ng/mL (ref 0.00–0.08)

## 2017-05-20 MED ORDER — IPRATROPIUM-ALBUTEROL 0.5-2.5 (3) MG/3ML IN SOLN
3.0000 mL | Freq: Once | RESPIRATORY_TRACT | Status: AC
  Administered 2017-05-20: 3 mL via RESPIRATORY_TRACT
  Filled 2017-05-20: qty 3

## 2017-05-20 MED ORDER — DILTIAZEM HCL 60 MG PO TABS
60.0000 mg | ORAL_TABLET | Freq: Once | ORAL | Status: AC
  Administered 2017-05-20: 60 mg via ORAL
  Filled 2017-05-20: qty 1

## 2017-05-20 MED ORDER — ALBUTEROL SULFATE (2.5 MG/3ML) 0.083% IN NEBU
5.0000 mg | INHALATION_SOLUTION | Freq: Once | RESPIRATORY_TRACT | Status: AC
  Administered 2017-05-20: 5 mg via RESPIRATORY_TRACT
  Filled 2017-05-20: qty 6

## 2017-05-20 MED ORDER — CARVEDILOL 12.5 MG PO TABS
12.5000 mg | ORAL_TABLET | Freq: Two times a day (BID) | ORAL | Status: DC
  Administered 2017-05-20: 12.5 mg via ORAL
  Filled 2017-05-20: qty 1

## 2017-05-20 MED ORDER — METHYLPREDNISOLONE SODIUM SUCC 125 MG IJ SOLR
125.0000 mg | Freq: Once | INTRAMUSCULAR | Status: AC
  Administered 2017-05-20: 125 mg via INTRAVENOUS
  Filled 2017-05-20: qty 2

## 2017-05-20 MED ORDER — PREDNISONE 20 MG PO TABS: ORAL_TABLET | ORAL | 0 refills | Status: DC
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
## 2017-05-20 NOTE — ED Notes (Signed)
Encouraged pt to provide urine sample. Pt states she cannot walk at this time. Will assist pt when she is able to provide sample.

## 2017-05-20 NOTE — ED Notes (Signed)
Pt willing to take medications at this time.

## 2017-05-20 NOTE — ED Notes (Signed)
Pt unable to sign for discharge. Pt verbalized understanding of discharge instructions.

## 2017-05-20 NOTE — ED Notes (Addendum)
Pt refusing to leave due to "not feeling better", however, pt unwilling to receive another breathing treatment that MD is offering to give. MD explained to pt that labs, xray look ok, pt safe to D/C on steroids. Pt adamant that she will leave when she is ready, but will not leave now. Pt continues to refuse any other treatment and yelled at RN  to leave the room.

## 2017-05-20 NOTE — ED Triage Notes (Signed)
Patient arrives from home with complaint of 3 hours worth of tightness in chest accompanied by difficulty breathing. Patient checked her BP and noted it to be high. Diminished lung sounds per EMS upon arrival. Duoneb administered by EMS, with little improvement. Second Duoneb given after that which was in process upon arrival.

## 2017-05-20 NOTE — ED Notes (Signed)
RN informed charge RN about pt's refusal to take any further medications and refusal to leave. Charge RN at bedside to talk with pt.

## 2017-05-21 ENCOUNTER — Ambulatory Visit (INDEPENDENT_AMBULATORY_CARE_PROVIDER_SITE_OTHER): Payer: Medicare Other | Admitting: Internal Medicine

## 2017-05-21 VITALS — BP 192/101 | HR 91 | Temp 98.0°F | Ht 63.0 in | Wt 226.8 lb

## 2017-05-21 DIAGNOSIS — Z825 Family history of asthma and other chronic lower respiratory diseases: Secondary | ICD-10-CM | POA: Diagnosis not present

## 2017-05-21 DIAGNOSIS — J0100 Acute maxillary sinusitis, unspecified: Secondary | ICD-10-CM | POA: Insufficient documentation

## 2017-05-21 DIAGNOSIS — Z8349 Family history of other endocrine, nutritional and metabolic diseases: Secondary | ICD-10-CM

## 2017-05-21 DIAGNOSIS — R0600 Dyspnea, unspecified: Secondary | ICD-10-CM

## 2017-05-21 DIAGNOSIS — J4521 Mild intermittent asthma with (acute) exacerbation: Secondary | ICD-10-CM | POA: Diagnosis not present

## 2017-05-21 DIAGNOSIS — J0101 Acute recurrent maxillary sinusitis: Secondary | ICD-10-CM

## 2017-05-21 DIAGNOSIS — Z8249 Family history of ischemic heart disease and other diseases of the circulatory system: Secondary | ICD-10-CM | POA: Diagnosis not present

## 2017-05-21 DIAGNOSIS — Z79899 Other long term (current) drug therapy: Secondary | ICD-10-CM | POA: Diagnosis not present

## 2017-05-21 DIAGNOSIS — I10 Essential (primary) hypertension: Secondary | ICD-10-CM

## 2017-05-21 DIAGNOSIS — Z21 Asymptomatic human immunodeficiency virus [HIV] infection status: Secondary | ICD-10-CM

## 2017-05-21 DIAGNOSIS — Z833 Family history of diabetes mellitus: Secondary | ICD-10-CM | POA: Diagnosis not present

## 2017-05-21 DIAGNOSIS — R0609 Other forms of dyspnea: Secondary | ICD-10-CM

## 2017-05-21 MED ORDER — AZITHROMYCIN 250 MG PO TABS: ORAL_TABLET | ORAL | 0 refills | Status: DC

## 2017-05-21 NOTE — Progress Notes (Signed)
CC: sinus pressure and nasal congestion, shortness of breath, follow up on hypertension  HPI:  [name] is a 61 y.o. pt reports 5 or 6 days of nasal congestion, sinus pressure and headache.  She feels like it started as a cold that is exacerbating her asthma.  She was seen in the ED yesterday for the same issue and was sent home on prednisone 20mg  she has taken once last night and one this morning. However patient feels she has a sinus infection, she states that she knows her body and was not given antibiotics in the ER.  When asked about her blood pressure being high she mentions that She only took her carvedilol this morning.    Please see A&P for status of the patient's chronic medical conditions  Past Medical History:  Diagnosis Date  . Anemia   . Anxiety    HX PANIC ATTACKS  . Arthritis    "starting to; in my hands" (07/09/2015)  . Chronic asthma with acute exacerbation    "I have chronic asthma all the time; sometimes exacerbations" (07/09/2015)  . Chronic lower back pain   . Cyst of right kidney    "3 of them; dx'd in ~ 01/2015"  . GERD (gastroesophageal reflux disease)   . Heart murmur   . History of blood transfusion    "related to my brain surgery I think"  . History of pulmonary embolism 07/09/2015  . HIV antibody positive ([address])   . HIV disease ([place])   . Hyperlipidemia   . Hypertension   . Lipodystrophy   . Pneumonia 07/09/2015  . Shingles   . Sleep apnea    "never completed part 2 of study; never wore mask" (07/09/2015)   Review of Systems:    ROS: Pulmonary: pt denies increased work of breathing, shortness of breath,  Cardiac: pt denies palpitations, chest pain,  Abdominal: pt denies abdominal pain, nausea, vomiting, or diarrhea Physical Exam:  Vitals:   05/21/17 0932  BP: (!) 192/101  Pulse: 91  Temp: 98 F (36.7 C)  TempSrc: Oral  SpO2: 92%  Weight: 226 lb 12.8 oz (102.9 kg)  Height: 5\' 3"  (1.6 m)   Physical Exam  Constitutional: She  is oriented to person, place, and time. No distress.  HENT:  Nose: Mucosal edema present. Right sinus exhibits maxillary sinus tenderness. Left sinus exhibits maxillary sinus tenderness.  Mouth/Throat: Mucous membranes are normal. No uvula swelling. No oropharyngeal exudate or posterior oropharyngeal erythema.  Cardiovascular: Normal rate, regular rhythm and normal heart sounds.  Exam reveals no gallop and no friction rub.   No murmur heard. Pulmonary/Chest: Effort normal and breath sounds normal. No respiratory distress. She has no wheezes. She has no rales. She exhibits no tenderness.  Abdominal: Soft. Bowel sounds are normal. She exhibits no distension and no mass. There is no tenderness. There is no rebound and no guarding.  Neurological: She is alert and oriented to person, place, and time.  Skin: She is not diaphoretic.    Social History   Social History  . Marital status: Divorced    Spouse name: N/A  . Number of children: N/A  . Years of education: N/A   Occupational History  . student    Social History Main Topics  . Smoking status: Never Smoker  . Smokeless tobacco: Never Used  . Alcohol use 0.0 oz/week     Comment: Rarely.  . Drug use: No  . Sexual activity: Not Currently    Partners: Male  Comment: declined condoms   Other Topics Concern  . Not on file   Social History Narrative   Originally from [place] then [place] now [place] 2016 approx   Disabled but going to [employer]   Updated 01/13/2017       Family History  Problem Relation Age of Onset  . Asthma Mother   . Heart failure Mother        cardiomyopathy  . Heart murmur Sister   . Heart murmur Brother   . Diabetes Sister   . Thyroid disease Sister   . Heart murmur Sister     Assessment & Plan:   See Encounters Tab for problem based charting.  Patient seen with Dr. Beryle Beams

## 2017-05-21 NOTE — Progress Notes (Signed)
Medicine attending: I personally interviewed and briefly examined this patient on the day of the patient visit and reviewed pertinent clinical and laboratory with resident physician Dr. Vickki Muff and we discussed a management plan. Acute sinusitis. Acute, mild, asthma attack. Underlying but well controlled HIV disease. Got steroids in ED yesterday. We will dive short course of azithromycin.

## 2017-05-21 NOTE — Assessment & Plan Note (Signed)
BP Readings from Last 3 Encounters:  05/21/17 (!) 192/101  05/20/17 (!) 164/105  05/13/17 126/84   Patient's blood pressure was elevated today, she reports only taking her carvedilol this morning.  Additionally she has just started a prednisone course for her asthma exacerbation.  After talking with her it seems as some days she takes different blood pressure medications.  She doesn't like to take so many medications so she limits how many she takes every day.  She also didn't understand that the losartan was for blood pressure.    -encouraged patient to take all 3 of her current regimen carvedilol, losartan and nifedipine and that the losartan is actually a very good medicine for blood pressure

## 2017-05-21 NOTE — Assessment & Plan Note (Signed)
Patient reports shortness of breath.  She feels her asthma is exacerbated by this new illness.  She does admit not being completely compliant with her Spiriva.  She did not take it this morning.  She did take her prednisone 20 mg this morning however and once after discharge from ED.  She says when she was given duo nebs yesterday in the emergency department this one a long way and relieving her shortness of breath.  On exam today I do not appreciate any wheezing.  The patient has no increased work of breathing or accessory muscle use.  She is able to talk in full sentences.  -Encourage patient to be more compliant with her inhaler in the future and finish her course of steroids as the ED prescribed

## 2017-05-21 NOTE — Patient Instructions (Signed)
I have written you a 6 day course of azithromycin 250mg  for your sinusitis.  Please continue your steroid course prescribed by the ED for your asthma and use your daily inhalers as usual.

## 2017-05-21 NOTE — Assessment & Plan Note (Signed)
Patient states that she agreed this started as a viral illness but is now convinced that she has bacterial sinusitis.  She does have some pain when I press on her maxillary sinuses and does have a headache.  She is afebrile today.  Her nares are inflamed.  In talking with the attending we decided to prescribe a short course of azithromycin.  -azithromycin 250mg  daily for 6 days

## 2017-05-24 ENCOUNTER — Telehealth: Payer: Self-pay | Admitting: *Deleted

## 2017-05-24 NOTE — Telephone Encounter (Signed)
Call from pt - continues to have right flank/back pain which is progressing. C/o nausea, light-headedness, SOB (mainly lying down),insomnia, off balance. Taking Zithromycin 250 mg now; states she has been on/off for months. O2 sat >90 and heart rate 50's-70's. Using oxygen all day (usually wears at night). Suggests going to ED - states she will and call us back tomorrow.

## 2017-05-25 NOTE — Telephone Encounter (Signed)
Would have her come in.  She has been complaining of this symptom for a while.  Maybe a CT is warranted if not done previously.

## 2017-05-25 NOTE — Telephone Encounter (Signed)
Called pt - stated she feels better,O2 sat mid 90's,BP 150/80. She has an appt w/cardiology tomorrow. She does want to schedule an appt to f/u right flank pain; stated it might be her pancreas. Appt scheduled tomorrow in [facility] at 0945 AM.

## 2017-05-26 ENCOUNTER — Encounter: Payer: Self-pay | Admitting: Cardiology

## 2017-05-26 ENCOUNTER — Ambulatory Visit (INDEPENDENT_AMBULATORY_CARE_PROVIDER_SITE_OTHER): Payer: Medicare Other | Admitting: Cardiology

## 2017-05-26 VITALS — BP 221/125 | HR 86 | Ht 63.0 in | Wt 219.0 lb

## 2017-05-26 DIAGNOSIS — J42 Unspecified chronic bronchitis: Secondary | ICD-10-CM | POA: Diagnosis not present

## 2017-05-26 DIAGNOSIS — B20 Human immunodeficiency virus [HIV] disease: Secondary | ICD-10-CM

## 2017-05-26 DIAGNOSIS — I483 Typical atrial flutter: Secondary | ICD-10-CM | POA: Diagnosis not present

## 2017-05-26 DIAGNOSIS — G4734 Idiopathic sleep related nonobstructive alveolar hypoventilation: Secondary | ICD-10-CM | POA: Diagnosis not present

## 2017-05-26 DIAGNOSIS — I2510 Atherosclerotic heart disease of native coronary artery without angina pectoris: Secondary | ICD-10-CM

## 2017-05-26 DIAGNOSIS — R0600 Dyspnea, unspecified: Secondary | ICD-10-CM | POA: Diagnosis not present

## 2017-05-26 DIAGNOSIS — I10 Essential (primary) hypertension: Secondary | ICD-10-CM

## 2017-05-26 DIAGNOSIS — Z6841 Body Mass Index (BMI) 40.0 and over, adult: Secondary | ICD-10-CM | POA: Diagnosis not present

## 2017-05-26 DIAGNOSIS — N183 Chronic kidney disease, stage 3 unspecified: Secondary | ICD-10-CM

## 2017-05-26 DIAGNOSIS — Z7901 Long term (current) use of anticoagulants: Secondary | ICD-10-CM | POA: Diagnosis not present

## 2017-05-26 MED ORDER — LOSARTAN POTASSIUM 100 MG PO TABS: 100.0000 mg | ORAL_TABLET | Freq: Every day | ORAL | 1 refills | Status: DC

## 2017-05-26 NOTE — Assessment & Plan Note (Signed)
Followed by ID clinic

## 2017-05-26 NOTE — Telephone Encounter (Signed)
Agree with appointment

## 2017-05-26 NOTE — Assessment & Plan Note (Signed)
BMI 38 

## 2017-05-26 NOTE — Assessment & Plan Note (Signed)
Mild non obstructive CAD at cath in 2013 [facility]) Negative Myoview June 2017

## 2017-05-26 NOTE — Assessment & Plan Note (Signed)
Non-apneic Nocturnal hypoxemia diagnosed by sleep study 05/11/16 improved with 1L supplemental O2 via [place]. Likely secondary to underlying Asthma/ COPD

## 2017-05-26 NOTE — Assessment & Plan Note (Signed)
Asthmatic component, followed by [name]

## 2017-05-26 NOTE — Assessment & Plan Note (Addendum)
Xarelto started in (?) June 2018 for PAF

## 2017-05-26 NOTE — Assessment & Plan Note (Signed)
Labile HTN-pt's main concern today "what is causing my B/P to be high"

## 2017-05-26 NOTE — Progress Notes (Signed)
05/26/2017 Shannon [name]   [birth year]  [id number]  Primary Physician [name], MD Primary Cardiologist: Dr Oval Linsey  HPI:  Pt is a 61 y/o obese AA female with HTN, COPD with asthmatic component, one episode of PAF June 2018, morbid obesity, and non obstructive CAD. She was seen in the ED 05/20/17 with dyspnea. See ED notes- pt did not agree with the treatment provided and refused to leave but eventually did take treatments ordered.  She was seen in the internal medicine clinic the next day. Dr Beryle Beams felt she had acute sinusitis, and a mild, asthma attack. Underlying but well controlled HIV disease. She had recieved steroids in ED 05/20/17 and he suggested a short course of azithromycin.  She is in the office today secondary to her ED visit. She has multiple complaints. She is always SOB. She has Rt flank pain, she is concerned about her B/P but didn't take her medications today (??). She is convinced "something is causing my B/P to be so high". I rechecked her B/P with a large cuff and got 162/108.    Current Outpatient Prescriptions  Medication Sig Dispense Refill  . acetaminophen (TYLENOL) 500 MG tablet Take 500 mg by mouth every 6 (six) hours as needed for mild pain.     [name] albuterol (PROAIR HFA) 108 (90 Base) MCG/ACT inhaler Inhale 2 puffs into the lungs every 4 (four) hours as needed for wheezing or shortness of breath. 18 g 3  . albuterol (PROVENTIL) (2.5 MG/3ML) 0.083% nebulizer solution Take 3 mLs (2.5 mg total) by nebulization every 6 (six) hours as needed for wheezing or shortness of breath. 75 mL 3  . azithromycin (ZITHROMAX) 250 MG tablet Please take one 250mg  tablet for 5 days. 6 each 0  . budesonide-formoterol (SYMBICORT) 80-4.5 MCG/ACT inhaler Inhale 2 puffs into the lungs 2 (two) times daily. 1 Inhaler 12  . carvedilol (COREG) 25 MG tablet Take 0.5 tablets (12.5 mg total) by mouth daily. 30 tablet 2  . citalopram (CELEXA) 10 MG tablet Take 10 mg by mouth daily.      . cyanocobalamin (,VITAMIN B-12,) 1000 MCG/ML injection Inject 1 mL (1,000 mcg total) into the muscle every 30 (thirty) days. 1 mL 6  . DESCOVY 200-25 MG tablet TAKE 1 TABLET BY MOUTH DAILY 30 tablet 5  . diltiazem (CARDIZEM SR) 60 MG 12 hr capsule Take 1 capsule (60 mg total) by mouth 2 (two) times daily. 60 capsule 2  . doxycycline (VIBRA-TABS) 100 MG tablet Take 1 tablet (100 mg total) by mouth 2 (two) times daily. 20 tablet 0  . fluticasone (FLONASE) 50 MCG/ACT nasal spray Place 2 sprays into both nostrils daily. (Patient taking differently: Place 2 sprays into both nostrils daily as needed for allergies. ) 16 g 5  . loratadine (CLARITIN) 10 MG tablet Take 1 tablet (10 mg total) by mouth daily as needed for allergies. 30 tablet 2  . losartan (COZAAR) 100 MG tablet Take 1 tablet (100 mg total) by mouth at bedtime. hypertension  1  . Multiple Vitamin (MULTIVITAMIN WITH MINERALS) TABS tablet Take 1 tablet by mouth daily. (Patient taking differently: Take 1 tablet by mouth every other day. ) 90 tablet   . NIFEdipine (ADALAT CC) 90 MG 24 hr tablet Take 1 tablet (90 mg total) by mouth daily. 30 tablet 5  . OXYGEN Inhale 2.5-3 L into the lungs at bedtime as needed (Shortness of Breath). 2.5 to 3 liters at night and as needed in the  daytime.     . predniSONE (DELTASONE) 20 MG tablet 2 tabs po daily x 3 days 6 tablet 0  . rivaroxaban (XARELTO) 20 MG TABS tablet Take 1 tablet (20 mg total) by mouth daily with supper. 30 tablet 6  . sodium chloride (OCEAN) 0.65 % SOLN nasal spray Place 1 spray into both nostrils as needed for congestion. 30 mL 1  . Tetrahydrozoline HCl (VISINE OP) Place 2 drops into both eyes daily as needed (for dry eyes).     [name] TIVICAY 50 MG tablet TAKE 1 TABLET BY MOUTH DAILY 30 tablet 5   No current facility-administered medications for this visit.     Allergies  Allergen Reactions  . Tree Extract Swelling and Other (See Comments)    Swelling to eyes  . Ciprofloxacin Hives     Past Medical History:  Diagnosis Date  . Anemia   . Anxiety    HX PANIC ATTACKS  . Arthritis    "starting to; in my hands" (07/09/2015)  . Chronic asthma with acute exacerbation    "I have chronic asthma all the time; sometimes exacerbations" (07/09/2015)  . Chronic lower back pain   . Cyst of right kidney    "3 of them; dx'd in ~ 01/2015"  . GERD (gastroesophageal reflux disease)   . Heart murmur   . History of blood transfusion    "related to my brain surgery I think"  . History of pulmonary embolism 07/09/2015  . HIV antibody positive ([place])   . HIV disease ([place])   . Hyperlipidemia   . Hypertension   . Lipodystrophy   . Pneumonia 07/09/2015  . Shingles   . Sleep apnea    "never completed part 2 of study; never wore mask" (07/09/2015)    Social History   Social History  . Marital status: Divorced    Spouse name: N/A  . Number of children: N/A  . Years of education: N/A   Occupational History  . student    Social History Main Topics  . Smoking status: Never Smoker  . Smokeless tobacco: Never Used  . Alcohol use 0.0 oz/week     Comment: Rarely.  . Drug use: No  . Sexual activity: Not Currently    Partners: Male     Comment: declined condoms   Other Topics Concern  . Not on file   Social History Narrative   Originally from [place] then [place] now [place] 2016 approx   Disabled but going to [place] [id number]   Updated 01/13/2017        Family History  Problem Relation Age of Onset  . Asthma Mother   . Heart failure Mother        cardiomyopathy  . Heart murmur Sister   . Heart murmur Brother   . Diabetes Sister   . Thyroid disease Sister   . Heart murmur Sister      Review of Systems: General: negative for chills, fever, night sweats or weight changes.  Cardiovascular: negative for chest pain, dyspnea on exertion, edema, orthopnea, palpitations, paroxysmal nocturnal dyspnea or shortness of breath Dermatological: negative for  rash Respiratory: negative for cough or wheezing Urologic: negative for hematuria Abdominal: negative for nausea, vomiting, diarrhea, bright red blood per rectum, melena, or hematemesis Neurologic: negative for visual changes, syncope, or dizziness All other systems reviewed and are otherwise negative except as noted above.    Blood pressure (!) 221/125, pulse 86, height 5\' 3"  (1.6 m), weight 219 lb (  99.3 kg).  General appearance: alert, cooperative, no distress, morbidly obese and anxious Lungs: clear to auscultation bilaterally Heart: regular rate and rhythm, soft systolic murmur AOV, preserved S2 Extremities: extremities normal, atraumatic, no cyanosis or edema Skin: Skin color, texture, turgor normal. No rashes or lesions Neurologic: Grossly normal  EKG NSR, QTc 442  ASSESSMENT AND PLAN:   Essential hypertension Labile HTN-pt's main concern today "what is causing my B/P to be high"  Dyspnea Seen in ED 05/20/17- (see ED notes)  COPD (chronic obstructive pulmonary disease) ([place]) Asthmatic component, followed by [facility]  CAD (coronary artery disease) Mild non obstructive CAD at cath in 2013 [facility]) Negative Myoview June 2017  Atrial flutter [facility]) Atrial flutter June 2018- declined DCCV and opted for Flecainide therapy- and converted  To NSR.  Followed in AF clinic, on chronic anticoagulation, not on Flecainide  Chronic kidney disease (CKD), stage III (moderate) (HCC) SCr 1.3-1.2  Morbid obesity with BMI of 40.0-44.9, adult (HCC) BMI 38  HIV disease ([place]) Followed by ID clinic  Nocturnal hypoxemia Non-apneic Nocturnal hypoxemia diagnosed by sleep study 05/11/16 improved with 1L supplemental O2 via [place]. Likely secondary to underlying Asthma/ COPD  Anticoagulated Xarelto started in (?) June 2018 for PAF   PLAN  I suggested Ms Solorzano take her Losartan QHS and her other medications as directed. We'll obtain renal artery dopplers. She had a follow up with Dr  Oval Linsey in Nov and will keep that.   [name] PA-C 05/26/2017 3:43 PM

## 2017-05-26 NOTE — Assessment & Plan Note (Signed)
Seen in ED 05/20/17- (see ED notes)

## 2017-05-26 NOTE — Telephone Encounter (Signed)
Pt's appt is 10/4 @ 0945 AM

## 2017-05-26 NOTE — Patient Instructions (Signed)
Medication Instructions: Start taking your Losartan at night.  If you need a refill on your cardiac medications before your next appointment, please call your pharmacy.   Procedures/Testing: Your physician has requested that you have a renal artery duplex. During this test, an ultrasound is used to evaluate blood flow to the kidneys. Allow one hour for this exam. Do not eat after midnight the day before and avoid carbonated beverages. Take your medications as you usually do. This will be done at [address] [facility], [address].  Follow-Up: Keep your follow up with Dr. Oval Linsey on 11/8.  Special Instructions:  Please take your blood pressure three times a week at different times of the day. Keep a log of this and bring to your next appointment.  Thank you for choosing [facility] at [facility]!!

## 2017-05-26 NOTE — Assessment & Plan Note (Signed)
SCr 1.3-1.2

## 2017-05-26 NOTE — Assessment & Plan Note (Signed)
Atrial flutter June 2018- declined DCCV and opted for Flecainide therapy- and converted  To NSR.  Followed in AF clinic, on chronic anticoagulation, not on Flecainide

## 2017-05-27 ENCOUNTER — Ambulatory Visit: Payer: Medicare Other

## 2017-05-27 ENCOUNTER — Telehealth: Payer: Self-pay | Admitting: Internal Medicine

## 2017-05-27 NOTE — Telephone Encounter (Signed)
Patient called c/o lower R thigh pain that is progressing. Stated " the middle of my lower back hurts but I think it's coming from my thigh". Also asking if she need a CT. Missed appt this am due to her pain.  Appt scheduled tomorrow @ 2:15 per patient preference.  Advised if symptoms get worse to go to UC/ED. Verbalized understanding.

## 2017-05-27 NOTE — Telephone Encounter (Signed)
Patient having Lower Right side Pain. Pt states "she has been up for 1 hour on the side of the bed".   Please call pat back.

## 2017-05-28 ENCOUNTER — Encounter: Payer: Self-pay | Admitting: Internal Medicine

## 2017-05-28 ENCOUNTER — Ambulatory Visit (INDEPENDENT_AMBULATORY_CARE_PROVIDER_SITE_OTHER): Payer: Medicare Other | Admitting: Internal Medicine

## 2017-05-28 VITALS — BP 160/88 | HR 92 | Temp 98.4°F | Wt 219.9 lb

## 2017-05-28 DIAGNOSIS — R011 Cardiac murmur, unspecified: Secondary | ICD-10-CM | POA: Diagnosis not present

## 2017-05-28 DIAGNOSIS — N281 Cyst of kidney, acquired: Secondary | ICD-10-CM

## 2017-05-28 DIAGNOSIS — M549 Dorsalgia, unspecified: Secondary | ICD-10-CM

## 2017-05-28 DIAGNOSIS — I2510 Atherosclerotic heart disease of native coronary artery without angina pectoris: Secondary | ICD-10-CM

## 2017-05-28 DIAGNOSIS — E669 Obesity, unspecified: Secondary | ICD-10-CM | POA: Diagnosis not present

## 2017-05-28 DIAGNOSIS — I10 Essential (primary) hypertension: Secondary | ICD-10-CM

## 2017-05-28 DIAGNOSIS — Q6102 Congenital multiple renal cysts: Secondary | ICD-10-CM

## 2017-05-28 DIAGNOSIS — Z7901 Long term (current) use of anticoagulants: Secondary | ICD-10-CM

## 2017-05-28 DIAGNOSIS — R1032 Left lower quadrant pain: Secondary | ICD-10-CM

## 2017-05-28 DIAGNOSIS — Z21 Asymptomatic human immunodeficiency virus [HIV] infection status: Secondary | ICD-10-CM

## 2017-05-28 DIAGNOSIS — Z6838 Body mass index (BMI) 38.0-38.9, adult: Secondary | ICD-10-CM | POA: Diagnosis not present

## 2017-05-28 DIAGNOSIS — J4540 Moderate persistent asthma, uncomplicated: Secondary | ICD-10-CM

## 2017-05-28 DIAGNOSIS — R1031 Right lower quadrant pain: Secondary | ICD-10-CM

## 2017-05-28 DIAGNOSIS — I4892 Unspecified atrial flutter: Secondary | ICD-10-CM

## 2017-05-28 NOTE — Patient Instructions (Addendum)
It was a pleasure to meet you Shannon [name].  We will try to arrange for you to have your MRI of the Abdomen.  I have given you a letter stating that you were seen in clinic today.  Please schedule a follow up appointment with Dr. Daryll Drown.

## 2017-05-28 NOTE — Progress Notes (Signed)
CC: Renal cysts and right flank pain  HPI:  Shannon [name] is a 61 y.o. female with PMH as listed below including HTN, Non-obstructive CAD, Atrial Flutter on Xarelto, Moderate Persistent Asthma, and HIV (CD4 620, RNA <20 on 05/12/17) who presents for evaluation of her right flank pain and renal cysts.  Right Flank Pain and Renal Cyst: Patient reports chronic right flank pain described as a constant pressure like pain. Pain is worse when she is sleeping on her right side. She denies any dysuria, hematuria, constipation, or diarrhea. She had a CT renal stone study on 01/28/17 which showed low-density lesions in the right kidney and hyperdense lesions in the left kidney which were unchanged from prior CT renal stone study on 11/30/16. She is requesting further imaging to evaluate her symptoms and cysts. An MRI Abdomen with pre and post contrast has already been ordered. She has multiple other complaints which she says are chronic including dizziness, SOB, nausea, and weight loss.  Past Medical History:  Diagnosis Date  . Anemia   . Anxiety    HX PANIC ATTACKS  . Arthritis    "starting to; in my hands" (07/09/2015)  . Chronic asthma with acute exacerbation    "I have chronic asthma all the time; sometimes exacerbations" (07/09/2015)  . Chronic lower back pain   . Cyst of right kidney    "3 of them; dx'd in ~ 01/2015"  . GERD (gastroesophageal reflux disease)   . Heart murmur   . History of blood transfusion    "related to my brain surgery I think"  . History of pulmonary embolism 07/09/2015  . HIV antibody positive ([place])   . HIV disease ([place])   . Hyperlipidemia   . Hypertension   . Lipodystrophy   . Pneumonia 07/09/2015  . Shingles   . Sleep apnea    "never completed part 2 of study; never wore mask" (07/09/2015)   Review of Systems:   Review of Systems  Constitutional: Positive for weight loss. Negative for chills and fever.  Respiratory: Positive for shortness of breath.     Cardiovascular: Negative for leg swelling.  Gastrointestinal: Positive for abdominal pain and nausea. Negative for blood in stool, constipation, diarrhea, melena and vomiting.  Genitourinary: Positive for flank pain. Negative for dysuria, frequency and hematuria.  Neurological: Positive for dizziness.     Physical Exam:  Vitals:   05/28/17 1440  BP: (!) 160/88  Pulse: 92  Temp: 98.4 F (36.9 C)  TempSrc: Oral  SpO2: 92%  Weight: 219 lb 14.4 oz (99.7 kg)   Physical Exam  Constitutional: She is oriented to person, place, and time. She appears well-developed and well-nourished.  Obese woman, appears anxious.  HENT:  Head: Normocephalic and atraumatic.  Cardiovascular: Normal rate and regular rhythm.   Systolic murmur loudest at RUS border. Radial pulses equal bilaterally  Pulmonary/Chest: Effort normal. No respiratory distress. She has no wheezes. She has no rales.  Distant breath sounds  Abdominal: Soft. Bowel sounds are normal.  Abdomen tender to palpation in RUQ/RLQ, flank, and over CVA area on right side. No tenderness on left side. Obese abdomen.  Musculoskeletal: She exhibits no edema.  Neurological: She is alert and oriented to person, place, and time.  Skin: Skin is warm. She is not diaphoretic.  Psychiatric: Her mood appears anxious.     Assessment & Plan:   See Encounters Tab for problem based charting.  Patient discussed with Dr. Lynnae January  Bilateral renal cysts Patient with renal  cysts seen on 2 CT scans and Renal U/S. She has continued right flank pain without obvious infectious cause or nephrolithiasis. It is possible she may have some MSK component as pain is worse when laying on her right side. She was recommended to have an MRI of her Abdomen performed for further evaluation, however has not arranged to have this completed. We will try to have this arranged. I asked her to update [place] on her best contact information.

## 2017-05-28 NOTE — Assessment & Plan Note (Signed)
Patient with renal cysts seen on 2 CT scans and Renal U/S. She has continued right flank pain without obvious infectious cause or nephrolithiasis. It is possible she may have some MSK component as pain is worse when laying on her right side. She was recommended to have an MRI of her Abdomen performed for further evaluation, however has not arranged to have this completed. We will try to have this arranged. I asked her to update [place] on her best contact information.

## 2017-05-31 NOTE — Progress Notes (Signed)
Internal Medicine Clinic Attending  Case discussed with Dr. Patel at the time of the visit.  We reviewed the resident's history and exam and pertinent patient test results.  I agree with the assessment, diagnosis, and plan of care documented in the resident's note.  

## 2017-06-02 ENCOUNTER — Ambulatory Visit (HOSPITAL_COMMUNITY)
Admission: RE | Admit: 2017-06-02 | Discharge: 2017-06-02 | Disposition: A | Discharge status: Home / Self Care | Payer: Medicare Other | Source: Ambulatory Visit | Attending: Internal Medicine | Admitting: Internal Medicine

## 2017-06-02 ENCOUNTER — Other Ambulatory Visit: Payer: Self-pay | Admitting: Cardiology

## 2017-06-02 DIAGNOSIS — N183 Chronic kidney disease, stage 3 unspecified: Secondary | ICD-10-CM

## 2017-06-02 DIAGNOSIS — I10 Essential (primary) hypertension: Secondary | ICD-10-CM | POA: Diagnosis not present

## 2017-06-04 ENCOUNTER — Ambulatory Visit: Payer: Medicare Other | Admitting: Pulmonary Disease

## 2017-06-08 ENCOUNTER — Encounter (HOSPITAL_COMMUNITY): Payer: Medicare Other

## 2017-06-12 DIAGNOSIS — G4733 Obstructive sleep apnea (adult) (pediatric): Secondary | ICD-10-CM | POA: Diagnosis not present

## 2017-06-12 DIAGNOSIS — J189 Pneumonia, unspecified organism: Secondary | ICD-10-CM | POA: Diagnosis not present

## 2017-06-14 ENCOUNTER — Ambulatory Visit: Payer: Medicare Other | Admitting: Endocrinology

## 2017-06-14 ENCOUNTER — Encounter (HOSPITAL_COMMUNITY): Payer: Medicare Other

## 2017-07-01 ENCOUNTER — Ambulatory Visit (INDEPENDENT_AMBULATORY_CARE_PROVIDER_SITE_OTHER): Payer: Medicare Other | Admitting: Cardiovascular Disease

## 2017-07-01 ENCOUNTER — Encounter: Payer: Self-pay | Admitting: Cardiovascular Disease

## 2017-07-01 VITALS — BP 158/64 | HR 115 | Ht 63.0 in | Wt 226.2 lb

## 2017-07-01 DIAGNOSIS — I2510 Atherosclerotic heart disease of native coronary artery without angina pectoris: Secondary | ICD-10-CM | POA: Diagnosis not present

## 2017-07-01 DIAGNOSIS — I483 Typical atrial flutter: Secondary | ICD-10-CM | POA: Diagnosis not present

## 2017-07-01 DIAGNOSIS — I10 Essential (primary) hypertension: Secondary | ICD-10-CM

## 2017-07-01 DIAGNOSIS — R0600 Dyspnea, unspecified: Secondary | ICD-10-CM

## 2017-07-01 DIAGNOSIS — I5033 Acute on chronic diastolic (congestive) heart failure: Secondary | ICD-10-CM | POA: Diagnosis not present

## 2017-07-01 MED ORDER — FUROSEMIDE 20 MG PO TABS: 20.0000 mg | ORAL_TABLET | Freq: Every day | ORAL | 5 refills | Status: DC

## 2017-07-01 MED ORDER — POTASSIUM CHLORIDE ER 10 MEQ PO TBCR: 10.0000 meq | EXTENDED_RELEASE_TABLET | Freq: Every day | ORAL | 5 refills | Status: DC

## 2017-07-01 MED ORDER — CARVEDILOL 12.5 MG PO TABS: 12.5000 mg | ORAL_TABLET | Freq: Two times a day (BID) | ORAL | 5 refills | Status: DC

## 2017-07-01 NOTE — Progress Notes (Signed)
Cardiology Office Note   Date:  07/01/2017   ID:  Shannon [name], DOB [birth year], MRN [id number]  PCP:  [name], MD  Cardiologist:   Skeet Latch, MD  ID: [name], MD  No chief complaint on file.    History of Present Illness: Shannon [name] is a 61 y.o. female with hypertension, asthma, non-obstructive CAD, OSA, prior PE, moderate pulmonary hypertension, mild ascending aorta aneurysm, and HIV (well-controlled)  who presents for follow up.  Shannon [name] was initially referred by her infectious disease doctor, [name], MD, on 12/26/15. [name] was concerned about her lower extremity edema and shortness of breath.  She was started on lasix 20 mg every other day.  Shannon [name] noted shortness of breath since her hospitalization for pneumonia 06/2015.   She denied lower extremity edema or orthopnea but did report chest pain when laying down at night.  She was referred for an echo 01/2016 that revealed LVEF 60-65% awith grade 1 diastolic dysfunction and hypokinesis of the basal inferior wall.  She also had very mild aortic stenosis with a mean gradient of 9 mmHg and trivial AR.  PASP was 52 mmHg.  She had a Lexiscan Myoview at that time that was negative for ischemia.  Shannon [name] previously had a Lexiscan Cardiolite in September 2013 that revealed a medium, mild reversible defect in the anterior wall and normal systolic function. She subsequently underwent cardiac catheterization that revealed nonobstructive coronary disease. She had mild pulmonary hypertension and a normal cardiac output. Left ventriculography revealed mild to moderate aortic regurgitation. She had an echo May 2016 that revealed an ejection fraction of 62% and mild diastolic dysfunction. It also revealed calcification of the aortic valve, mild to moderate aortic regurgitation, and mild aortic stenosis. Peak velocity was 2.1 m/s. She had a 24-hour Holter in February 2015 that revealed rare PACs and PVCs.  Echo 01/06/17  revealed LVEF 60-65% with mild LVH and a mean aortic valve gradient of 8 mmHg.  Tricuspid regurgitation was not significant enough to assess pulmonary pressures  Shannon [name] was admitted to the hospital 01/25/17 with hypertensive emergency and chest pain. Her blood pressure was 215/100.  Her blood pressure in the ED was 185/119. She was again seen in the ED 01/28/17 with shortness of breath and wheezing. Her blood pressure at that time was 190/120. It was noted that she hasn't been taking her medication as prescribed.  HCTZ was discontinued.   Shannon [name] has been very stressed.  She had to take a medical leave of absence from school this semester.  She reports that her BP has been in the 130s-140s lately.  She has been taking carvedilol 69m daily instead of bid.  She has noted increased lower extremity edema and mild orthopnea.   She also continues to get short of breath with exertion.  She denies chest pain.    Shannon [name] also has a myriad of other complaints today including her ganglion cyst, a slowly-healing anterior tibia wound, L face/arm numbness, lipodystrophy, insomnia, dryness, and vertigo.   Past Medical History:  Diagnosis Date  . Anemia   . Anxiety    HX PANIC ATTACKS  . Arthritis    "starting to; in my hands" (07/09/2015)  . Chronic asthma with acute exacerbation    "I have chronic asthma all the time; sometimes exacerbations" (07/09/2015)  . Chronic lower back pain   . Cyst of right kidney    "3 of them; dx'd in ~  01/2015"  . GERD (gastroesophageal reflux disease)   . Heart murmur   . History of blood transfusion    "related to my brain surgery I think"  . History of pulmonary embolism 07/09/2015  . HIV antibody positive ([place])   . HIV disease ([place])   . Hyperlipidemia   . Hypertension   . Lipodystrophy   . Pneumonia 07/09/2015  . Shingles   . Sleep apnea    "never completed part 2 of study; never wore mask" (07/09/2015)    Past Surgical History:  Procedure Laterality Date  .  ABDOMINAL HYSTERECTOMY     "robotic laparosopic"  . BRAIN SURGERY  1974   "brain tumor; benign; on top of my brain; got a plate in there"  . CARDIAC CATHETERIZATION    . TONSILLECTOMY AND ADENOIDECTOMY       Current Outpatient Medications  Medication Sig Dispense Refill  . albuterol (PROAIR HFA) 108 (90 Base) MCG/ACT inhaler Inhale 2 puffs into the lungs every 4 (four) hours as needed for wheezing or shortness of breath. 18 g 3  . albuterol (PROVENTIL) (2.5 MG/3ML) 0.083% nebulizer solution Take 3 mLs (2.5 mg total) by nebulization every 6 (six) hours as needed for wheezing or shortness of breath. 75 mL 3  . budesonide-formoterol (SYMBICORT) 80-4.5 MCG/ACT inhaler Inhale 2 puffs into the lungs 2 (two) times daily. 1 Inhaler 12  . carvedilol (COREG) 12.5 MG tablet Take 1 tablet (12.5 mg total) 2 (two) times daily with a meal by mouth. 60 tablet 5  . citalopram (CELEXA) 10 MG tablet Take 10 mg by mouth daily.    . cyanocobalamin (,VITAMIN B-12,) 1000 MCG/ML injection Inject 1 mL (1,000 mcg total) into the muscle every 30 (thirty) days. 1 mL 6  . DESCOVY 200-25 MG tablet TAKE 1 TABLET BY MOUTH DAILY 30 tablet 5  . diltiazem (CARDIZEM SR) 60 MG 12 hr capsule Take 1 capsule (60 mg total) by mouth 2 (two) times daily. 60 capsule 2  . fluticasone (FLONASE) 50 MCG/ACT nasal spray Place 2 sprays into both nostrils daily. 16 g 5  . losartan (COZAAR) 100 MG tablet Take 1 tablet (100 mg total) by mouth at bedtime. hypertension  1  . Multiple Vitamin (MULTIVITAMIN WITH MINERALS) TABS tablet Take 1 tablet by mouth daily. 90 tablet   . NIFEdipine (ADALAT CC) 90 MG 24 hr tablet Take 1 tablet (90 mg total) by mouth daily. 30 tablet 5  . OXYGEN Inhale 2.5-3 L into the lungs at bedtime as needed (Shortness of Breath). 2.5 to 3 liters at night and as needed in the daytime.     . rivaroxaban (XARELTO) 20 MG TABS tablet Take 1 tablet (20 mg total) by mouth daily with supper. 30 tablet 6  . sodium chloride (OCEAN)  0.65 % SOLN nasal spray Place 1 spray into both nostrils as needed for congestion. 30 mL 1  . Tetrahydrozoline HCl (VISINE OP) Place 2 drops into both eyes daily as needed (for dry eyes).     [name] TIVICAY 50 MG tablet TAKE 1 TABLET BY MOUTH DAILY 30 tablet 5  . furosemide (LASIX) 20 MG tablet Take 1 tablet (20 mg total) daily by mouth. 30 tablet 5  . loratadine (CLARITIN) 10 MG tablet Take 1 tablet (10 mg total) by mouth daily as needed for allergies. 30 tablet 2  . potassium chloride (K-DUR) 10 MEQ tablet Take 1 tablet (10 mEq total) daily by mouth. 30 tablet 5   No current facility-administered medications for  this visit.     Allergies:   Tree extract and Ciprofloxacin    Social History:  The patient  reports that  has never smoked. she has never used smokeless tobacco. She reports that she drinks alcohol. She reports that she does not use drugs.   Family History:  The patient's family history includes Asthma in her mother; Diabetes in her sister; Heart failure in her mother; Heart murmur in her brother, sister, and sister; Thyroid disease in her sister.    ROS:  Please see the history of present illness.   Otherwise, review of systems are positive for none   All other systems are reviewed and negative.   PHYSICAL EXAM: VS:  BP (!) 158/64   Pulse (!) 115   Ht '5\' 3"'$  (1.6 m)   Wt 102.6 kg (226 lb 3.2 oz)   SpO2 94%   BMI 40.07 kg/m  , BMI Body mass index is 40.07 kg/m. GENERAL:  Well appearing.  No acute distress HEENT: Pupils equal round and reactive, fundi not visualized, oral mucosa unremarkable NECK:  JVD 1cm above the clavicle at 45 degrees. Waveform within normal limits, carotid upstroke brisk and symmetric, no bruits, no thyromegaly LUNGS:  Clear to auscultation bilaterally.  No crackles, rhonchi or wheezes.  HEART:  RRR.  PMI not displaced or sustained,S1 and S2 within normal limits, no S3, no S4, no clicks, no rubs, no murmurs ABD:  Flat, positive bowel sounds normal in  frequency in pitch, no bruits, no rebound, no guarding, no midline pulsatile mass, no hepatomegaly, no splenomegaly.  RUQ TTP EXT:  2 plus pulses throughout, 1+ pitting edema to the upper tibia bilaterally, no cyanosis no clubbing SKIN:  No rashes no nodules NEURO:  Cranial nerves II through XII grossly intact, motor grossly intact throughout PSYCH:  Cognitively intact, oriented to person place and time   EKG:  EKG is ordered today. The ekg ordered 12/31/15 shows sinus rhythm. Rate 89 bpm. 2 PVCs. 10/28/16: Sinus rhythm. Rate 93 bpm 07/01/17: Sinus tachycardia.  Rate 115 bpm.  LAD.    Coronary angiography 06/01/12: 10-20% OM, diffuse 20-30% LAD, 10-20% D1, 20% diffuse RCA RA 10, RV 39/12, RV EDP 12, capillary wedge pressure 19, PA 39/21, mean 27, LV 116/14, LVEDP 20, aorta 115/77 Cardiac output (Fick) 6.55, cardiac index 3.2 PVR 1.7 with units, SVR 12 with units. She P/QRS ratio 1 LVEF 80%. Aortic root mildly dilated. 1-2+ aortic regurgitation.  24-hour Holter 09/26/13: Sinus rhythm, rare PACs, rare PVCs  Echo 01/22/15: LVEF 70%. Mild LVH. Mild diastolic dysfunction. Normal RV function. Left atrium moderately enlarged. Right atrium mildly enlarged. Trace mitral regurgitation. Calcific aortic valve. Mild to moderate aortic regurgitation. Mild aortic stenosis. Peak velocity 2.1 m/s trace tricuspid regurgitation    Lexiscan Myoview 01/2016:   The left ventricular ejection fraction is normal (55-65%). The EF is 60% visually. The computer generated EF is not calculated correctly and is therefore not reported.  There was no ST segment deviation noted during stress.  The study is normal. no ischemia . no infarction   Echo 01/29/16: Study Conclusions  - Left ventricle: The cavity size was normal. Wall thickness was   increased in a pattern of moderate LVH. Systolic function was   normal. The estimated ejection fraction was in the range of 60%   to 65%. Basal inferior hypokinesis. Doppler  parameters are   consistent with abnormal left ventricular relaxation (grade 1   diastolic dysfunction). - Aortic valve: Trileaflet; moderately calcified leaflets.  There   was very mild stenosis. There was mild regurgitation. Mean   gradient (S): 9 mm Hg. Valve area (VTI): 1.68 cm^2. - Mitral valve: There was trivial regurgitation. - Left atrium: The atrium was mildly dilated. - Right ventricle: The cavity size was normal. Systolic function   was normal. - Tricuspid valve: Peak RV-RA gradient (S): 44 mm Hg. - Pulmonary arteries: PA peak pressure: 52 mm Hg (S). - Systemic veins: IVC measured 2.3 cm with > 50% respirophasic   variation, suggesting RA pressure 8 mmHg.  Impressions:  - Normal LV size with moderate LV hypertrophy. EF 60-65%. Basal   inferior hypokinesis. Normal RV size and systolic function. Very   mild aortic stenosis, mild aortic insufficieny. Moderate   pulmonary hypertension.   Recent Labs: 01/28/2017: B Natriuretic Peptide 71.0 05/20/2017: ALT 17; BUN 13; Creatinine, Ser 1.22; Hemoglobin 12.2; Platelets 177; Potassium 3.8; Sodium 139    Lipid Panel    Component Value Date/Time   CHOL 217 (H) 11/26/2016 1618   TRIG 88 11/26/2016 1618   HDL 67 11/26/2016 1618   CHOLHDL 3.2 11/26/2016 1618   VLDL 18 11/26/2016 1618   LDLCALC 132 (H) 11/26/2016 1618      Wt Readings from Last 3 Encounters:  07/01/17 102.6 kg (226 lb 3.2 oz)  05/28/17 99.7 kg (219 lb 14.4 oz)  05/26/17 99.3 kg (219 lb)      ASSESSMENT AND PLAN:  # Atrial flutter: Converted with flecainide.  Continue diltiazem, carvedilol and Xarelto.   # Hypertension:  BP above goal again.  She is back to taking carvedilol once daily.  I tried to convince her to take 25 mg twice daily but she is reluctant.  She will start with 12.5 mg twice daily crease of her blood pressure remains greater than 130/80.  Continue carvedilol, diltiazem, losartan, and nifedipine.  We will also start Lasix 20 mg daily.   Check BMP at follow-up.  # Aortic regurgitation # Aortic stenosis: Shannon [name] has very mild aortic stenosis (mean gradient 9 mmHg) and mild aortic regurgitation.  Continue to monitor.    # Hyperlipidemia: Will address at follow up.  # Ascending aorta aneurysm: 4.0 on CT 12/2016.  Will re-assess 12/2017.  Current medicines are reviewed at length with the patient today.  The patient does not have concerns regarding medicines.  The following changes have been made:  Increase nifedipine to 90 mg daily.   Labs/ tests ordered today include:   No orders of the defined types were placed in this encounter.    Disposition:   FU with Kamuela Magos C. Oval Linsey, MD, [facility] in 2 weeks.   Time spent: 30 minutes-Greater than 50% of this time was spent in counseling, explanation of diagnosis, planning of further management, and coordination of care.   Signed, Alvie Fowles C. Oval Linsey, MD, [facility]  07/01/2017 5:44 PM    [place]

## 2017-07-01 NOTE — Patient Instructions (Addendum)
Medication Instructions:  DECREASE YOUR CARVEDILOL TO 12.5 MG TWICE A DAY   START FUROSEMIDE 20 MG DAILY  START POTASSIUM 10 MEQ DAILY   Labwork: NONE  Testing/Procedures: NONE  Follow-Up: Your physician recommends that you schedule a follow-up appointment in: 2 WEEKS   If you need a refill on your cardiac medications before your next appointment, please call your pharmacy.

## 2017-07-06 DIAGNOSIS — M67431 Ganglion, right wrist: Secondary | ICD-10-CM | POA: Diagnosis not present

## 2017-07-12 ENCOUNTER — Ambulatory Visit (INDEPENDENT_AMBULATORY_CARE_PROVIDER_SITE_OTHER): Payer: Medicare Other | Admitting: Pulmonary Disease

## 2017-07-12 ENCOUNTER — Other Ambulatory Visit (INDEPENDENT_AMBULATORY_CARE_PROVIDER_SITE_OTHER): Payer: Medicare Other

## 2017-07-12 ENCOUNTER — Encounter: Payer: Self-pay | Admitting: Pulmonary Disease

## 2017-07-12 VITALS — BP 130/84 | HR 81 | Ht 63.0 in | Wt 231.0 lb

## 2017-07-12 DIAGNOSIS — J45909 Unspecified asthma, uncomplicated: Secondary | ICD-10-CM | POA: Diagnosis not present

## 2017-07-12 DIAGNOSIS — J209 Acute bronchitis, unspecified: Secondary | ICD-10-CM

## 2017-07-12 DIAGNOSIS — J40 Bronchitis, not specified as acute or chronic: Secondary | ICD-10-CM

## 2017-07-12 DIAGNOSIS — J4541 Moderate persistent asthma with (acute) exacerbation: Secondary | ICD-10-CM | POA: Diagnosis not present

## 2017-07-12 LAB — CBC WITH DIFFERENTIAL/PLATELET
BASOS ABS: 0 10*3/uL (ref 0.0–0.1)
Basophils Relative: 0.6 % (ref 0.0–3.0)
Eosinophils Absolute: 0.1 10*3/uL (ref 0.0–0.7)
Eosinophils Relative: 2.3 % (ref 0.0–5.0)
HCT: 40.5 % (ref 36.0–46.0)
Hemoglobin: 13 g/dL (ref 12.0–15.0)
LYMPHS ABS: 1.4 10*3/uL (ref 0.7–4.0)
Lymphocytes Relative: 23.7 % (ref 12.0–46.0)
MCHC: 32.2 g/dL (ref 30.0–36.0)
MCV: 95.8 fl (ref 78.0–100.0)
MONOS PCT: 6.5 % (ref 3.0–12.0)
Monocytes Absolute: 0.4 10*3/uL (ref 0.1–1.0)
NEUTROS ABS: 3.8 10*3/uL (ref 1.4–7.7)
NEUTROS PCT: 66.9 % (ref 43.0–77.0)
PLATELETS: 192 10*3/uL (ref 150.0–400.0)
RBC: 4.23 Mil/uL (ref 3.87–5.11)
RDW: 14 % (ref 11.5–15.5)
WBC: 5.7 10*3/uL (ref 4.0–10.5)

## 2017-07-12 MED ORDER — DOXYCYCLINE HYCLATE 100 MG PO TABS: 100.0000 mg | ORAL_TABLET | Freq: Every day | ORAL | 0 refills | Status: DC

## 2017-07-12 MED ORDER — PREDNISONE 10 MG PO TABS: ORAL_TABLET | ORAL | 0 refills | Status: DC

## 2017-07-12 MED ORDER — METHYLPREDNISOLONE ACETATE 80 MG/ML IJ SUSP
80.0000 mg | Freq: Once | INTRAMUSCULAR | Status: AC
  Administered 2017-07-12: 80 mg via INTRAMUSCULAR

## 2017-07-12 NOTE — Patient Instructions (Addendum)
Solu-Medrol 80 mg IM x1. Get back on Symbicort 162 puffs twice daily-rinse mouth after use.  Use duo nebs 3-4 times daily especially for wheezing as needed.  Prednisone 10 mg tabs Take 4 tabs  daily with food x 4 days, then 3 tabs daily x 4 days, then 2 tabs daily x 4 days, then 1 tab daily x4 days then stop. #40 DOxy 100 daily x 7ds  Blood work today -CBC with differential and RAST panle

## 2017-07-12 NOTE — Progress Notes (Signed)
   Subjective:    Patient ID: Shannon [name], female    DOB: 01-13-1956, 61 y.o.   MRN: [id number]  HPI  61 year old female never smoker followed for chronic severe asthma and nocturnal hypoxemia on O2 1-2 L/m .  HIV dz f/by ID clinic - CD4 620 04/2017  She was last seen 04/2017 and presents to establish care with me.  She has been maintained on Symbicort but admits to poor compliance with this.  She reports increased dyspnea and wheezing for last few weeks.  She reports a cough productive of yellow sputum.  She reports increased wheezing and difficulty sleeping. She also reports sinus congestion and scratchy throat  Prednisone causes weight gain and increase in appetite  Denies any change in her environment or any clear triggers when detailed history obtained  Significant tests/ events reviewed  CTa chest 12/2016 , 01/2017 >neg for PE , multifocal scarring bilaterally PFT (12/04/15) FEV1/FVC 50%, FEV1 0.64 31%. +++ BD response. DLCO 66%. Sleep study in 04/2016 was (-) for OSA    Past Medical History:  Diagnosis Date  . Anemia   . Anxiety    HX PANIC ATTACKS  . Arthritis    "starting to; in my hands" (07/09/2015)  . Chronic asthma with acute exacerbation    "I have chronic asthma all the time; sometimes exacerbations" (07/09/2015)  . Chronic lower back pain   . Cyst of right kidney    "3 of them; dx'd in ~ 01/2015"  . GERD (gastroesophageal reflux disease)   . Heart murmur   . History of blood transfusion    "related to my brain surgery I think"  . History of pulmonary embolism 07/09/2015  . HIV antibody positive ([place])   . HIV disease ([place])   . Hyperlipidemia   . Hypertension   . Lipodystrophy   . Pneumonia 07/09/2015  . Shingles   . Sleep apnea    "never completed part 2 of study; never wore mask" (07/09/2015)     Review of Systems neg for any significant sore throat, dysphagia, itching, sneezing, nasal congestion or excess/ purulent secretions, fever, chills,  sweats, unintended wt loss, pleuritic or exertional cp, hempoptysis, orthopnea pnd or change in chronic leg swelling. Also denies presyncope, palpitations, heartburn, abdominal pain, nausea, vomiting, diarrhea or change in bowel or urinary habits, dysuria,hematuria, rash, arthralgias, visual complaints, headache, numbness weakness or ataxia.     Objective:   Physical Exam  Gen. Pleasant, obese, in no distress, normal affect ENT - no lesions, no post nasal drip, class 2-3 airway Neck: No JVD, no thyromegaly, no carotid bruits Lungs: no use of accessory muscles, no dullness to percussion, bilateral diffuse rhonchi  Cardiovascular: Rhythm regular, heart sounds  normal, no murmurs or gallops, no peripheral edema Abdomen: soft and non-tender, no hepatosplenomegaly, BS normal. Musculoskeletal: No deformities, no cyanosis or clubbing Neuro:  alert, non focal, no tremors       Assessment & Plan:

## 2017-07-13 ENCOUNTER — Other Ambulatory Visit: Payer: Self-pay | Admitting: *Deleted

## 2017-07-13 ENCOUNTER — Telehealth: Payer: Self-pay | Admitting: Cardiovascular Disease

## 2017-07-13 ENCOUNTER — Telehealth: Payer: Self-pay | Admitting: Nurse Practitioner

## 2017-07-13 DIAGNOSIS — G4733 Obstructive sleep apnea (adult) (pediatric): Secondary | ICD-10-CM | POA: Diagnosis not present

## 2017-07-13 DIAGNOSIS — J189 Pneumonia, unspecified organism: Secondary | ICD-10-CM | POA: Diagnosis not present

## 2017-07-13 MED ORDER — DILTIAZEM HCL ER COATED BEADS 120 MG PO CP24: 120.0000 mg | ORAL_CAPSULE | Freq: Every day | ORAL | 1 refills | Status: DC

## 2017-07-13 NOTE — Assessment & Plan Note (Signed)
Solu-Medrol 80 mg IM x1. Get back on Symbicort 162 puffs twice daily-rinse mouth after use.  Use duo nebs 3-4 times daily especially for wheezing as needed.  Prednisone 10 mg tabs Take 4 tabs  daily with food x 4 days, then 3 tabs daily x 4 days, then 2 tabs daily x 4 days, then 1 tab daily x4 days then stop. #40 DOxy 100 daily x 7ds  Blood work today -CBC with differential and RAST panel

## 2017-07-13 NOTE — Telephone Encounter (Signed)
   Pt called back inquiring about her diltiazem.  I reviewed her chart and informed her that the formulation was changed from 60 SR BID to 120 CD Daily.   Caller verbalized understanding and was grateful for the call back.  Murray Hodgkins, NP 07/13/2017, 7:11 PM

## 2017-07-13 NOTE — Telephone Encounter (Signed)
Agree 

## 2017-07-13 NOTE — Addendum Note (Signed)
Addended by: Alvina Filbert B on: 07/13/2017 06:32 PM   Modules accepted: Orders

## 2017-07-13 NOTE — Telephone Encounter (Signed)
Patient had last dose of Diltiazem 60mg  SR on Nov/4. She is completely out of diltiazem and willing to go to other pharmacy in the area to pick up.   Called [facility], [facility], [facility] and [facility]. No one has Diltiazem 60mg  SR capsules available.    Recommendation:  STOP taking diltiazem 60mg  SR every 12 hours START  Diltiazem CD 120mg  every day

## 2017-07-13 NOTE — Telephone Encounter (Signed)
Will forward to Dr Oval Linsey and Sherian Rein D for review

## 2017-07-13 NOTE — Telephone Encounter (Signed)
Left patient message with change

## 2017-07-13 NOTE — Assessment & Plan Note (Addendum)
We will treat as bacterial because of bronchitis with doxycycline given productive yellow sputum Her last CD4 count was good, hence did not have to consider unusual organisms

## 2017-07-13 NOTE — Progress Notes (Signed)
D/C DILTIAZEM 60 MG SECONDARY TO NOT BEING ABLE TO GET MEDICATION  SEE PHONE NOT 07/13/17

## 2017-07-13 NOTE — Telephone Encounter (Signed)
They have not been able to get the Diltiazem 60 mg in.She wants to know if you could change this medicine?

## 2017-07-19 NOTE — Progress Notes (Deleted)
Cardiology Office Note   Date:  07/19/2017   ID:  Shannon [name], DOB 09/27/1955, MRN [id number]  PCP:  [name], MD  Cardiologist:   Skeet Latch, MD  ID: [name], MD  No chief complaint on file.    History of Present Illness: Shannon [name] is a 61 y.o. female with hypertension, asthma, non-obstructive CAD, OSA, prior PE, moderate pulmonary hypertension, mild ascending aorta aneurysm, and HIV (well-controlled)  who presents for follow up.  Shannon [name] was initially referred by her infectious disease doctor, [name], MD, on 12/26/15. [name] was concerned about her lower extremity edema and shortness of breath.  She was started on lasix 20 mg every other day.  Shannon [name] noted shortness of breath since her hospitalization for pneumonia 06/2015.   She denied lower extremity edema or orthopnea but did report chest pain when laying down at night.  She was referred for an echo 01/2016 that revealed LVEF 60-65% awith grade 1 diastolic dysfunction and hypokinesis of the basal inferior wall.  She also had very mild aortic stenosis with a mean gradient of 9 mmHg and trivial AR.  PASP was 52 mmHg.  She had a Lexiscan Myoview at that time that was negative for ischemia.  Shannon [name] previously had a Lexiscan Cardiolite in September 2013 that revealed a medium, mild reversible defect in the anterior wall and normal systolic function. She subsequently underwent cardiac catheterization that revealed nonobstructive coronary disease. She had mild pulmonary hypertension and a normal cardiac output. Left ventriculography revealed mild to moderate aortic regurgitation. She had an echo May 2016 that revealed an ejection fraction of 03% and mild diastolic dysfunction. It also revealed calcification of the aortic valve, mild to moderate aortic regurgitation, and mild aortic stenosis. Peak velocity was 2.1 m/s. She had a 24-hour Holter in February 2015 that revealed rare PACs and PVCs.  Echo 01/06/17  revealed LVEF 60-65% with mild LVH and a mean aortic valve gradient of 8 mmHg.  Tricuspid regurgitation was not significant enough to assess pulmonary pressures  Shannon [name] was admitted to the hospital 01/25/17 with hypertensive emergency and chest pain. Her blood pressure was 215/100.  Her blood pressure in the ED was 185/119. She was again seen in the ED 01/28/17 with shortness of breath and wheezing. Her blood pressure at that time was 190/120. It was noted that she hasn't been taking her medication as prescribed.  HCTZ was discontinued.   Shannon [name] has been very stressed.  She had to take a medical leave of absence from school this semester.  She reports that her BP has been in the 130s-140s lately.  She has been taking carvedilol 62m daily instead of bid.  She has noted increased lower extremity edema and mild orthopnea.   She also continues to get short of breath with exertion.  She denies chest pain.    Shannon [name] also has a myriad of other complaints today including her ganglion cyst, a slowly-healing anterior tibia wound, L face/arm numbness, lipodystrophy, insomnia, dryness, and vertigo.   At her last appointment Shannon [name]'s blood pressure remained elevated.  She was taking carvedilol only once daily.  She was convinced to take it bid, but would only take 12.522m    Past Medical History:  Diagnosis Date  . Anemia   . Anxiety    HX PANIC ATTACKS  . Arthritis    "starting to; in my hands" (07/09/2015)  . Chronic asthma with acute exacerbation    "  I have chronic asthma all the time; sometimes exacerbations" (07/09/2015)  . Chronic lower back pain   . Cyst of right kidney    "3 of them; dx'd in ~ 01/2015"  . GERD (gastroesophageal reflux disease)   . Heart murmur   . History of blood transfusion    "related to my brain surgery I think"  . History of pulmonary embolism 07/09/2015  . HIV antibody positive ([place])   . HIV disease ([place])   . Hyperlipidemia   . Hypertension   . Lipodystrophy   .  Pneumonia 07/09/2015  . Shingles   . Sleep apnea    "never completed part 2 of study; never wore mask" (07/09/2015)    Past Surgical History:  Procedure Laterality Date  . ABDOMINAL HYSTERECTOMY     "robotic laparosopic"  . BRAIN SURGERY  1974   "brain tumor; benign; on top of my brain; got a plate in there"  . CARDIAC CATHETERIZATION    . TONSILLECTOMY AND ADENOIDECTOMY       Current Outpatient Medications  Medication Sig Dispense Refill  . albuterol (PROAIR HFA) 108 (90 Base) MCG/ACT inhaler Inhale 2 puffs into the lungs every 4 (four) hours as needed for wheezing or shortness of breath. 18 g 3  . albuterol (PROVENTIL) (2.5 MG/3ML) 0.083% nebulizer solution Take 3 mLs (2.5 mg total) by nebulization every 6 (six) hours as needed for wheezing or shortness of breath. 75 mL 3  . budesonide-formoterol (SYMBICORT) 80-4.5 MCG/ACT inhaler Inhale 2 puffs into the lungs 2 (two) times daily. 1 Inhaler 12  . carvedilol (COREG) 12.5 MG tablet Take 1 tablet (12.5 mg total) 2 (two) times daily with a meal by mouth. 60 tablet 5  . citalopram (CELEXA) 10 MG tablet Take 10 mg by mouth daily.    . cyanocobalamin (,VITAMIN B-12,) 1000 MCG/ML injection Inject 1 mL (1,000 mcg total) into the muscle every 30 (thirty) days. 1 mL 6  . DESCOVY 200-25 MG tablet TAKE 1 TABLET BY MOUTH DAILY 30 tablet 5  . diltiazem (CARDIZEM CD) 120 MG 24 hr capsule Take 1 capsule (120 mg total) by mouth daily. 90 capsule 1  . doxycycline (VIBRA-TABS) 100 MG tablet Take 1 tablet (100 mg total) daily by mouth. 7 tablet 0  . fluticasone (FLONASE) 50 MCG/ACT nasal spray Place 2 sprays into both nostrils daily. 16 g 5  . furosemide (LASIX) 20 MG tablet Take 1 tablet (20 mg total) daily by mouth. 30 tablet 5  . loratadine (CLARITIN) 10 MG tablet Take 1 tablet (10 mg total) by mouth daily as needed for allergies. 30 tablet 2  . losartan (COZAAR) 100 MG tablet Take 1 tablet (100 mg total) by mouth at bedtime. hypertension  1  .  Multiple Vitamin (MULTIVITAMIN WITH MINERALS) TABS tablet Take 1 tablet by mouth daily. 90 tablet   . NIFEdipine (ADALAT CC) 90 MG 24 hr tablet Take 1 tablet (90 mg total) by mouth daily. 30 tablet 5  . OXYGEN Inhale 2.5-3 L into the lungs at bedtime as needed (Shortness of Breath). 2.5 to 3 liters at night and as needed in the daytime.     . potassium chloride (K-DUR) 10 MEQ tablet Take 1 tablet (10 mEq total) daily by mouth. 30 tablet 5  . predniSONE (DELTASONE) 10 MG tablet Take 4 tabs daily with food x 4 days, then 3 tabs daily x 4 days, then 2 tabs daily x 4 days, then 1 tab daily x 4 days then STOP. [place]  tablet 0  . rivaroxaban (XARELTO) 20 MG TABS tablet Take 1 tablet (20 mg total) by mouth daily with supper. 30 tablet 6  . sodium chloride (OCEAN) 0.65 % SOLN nasal spray Place 1 spray into both nostrils as needed for congestion. 30 mL 1  . Tetrahydrozoline HCl (VISINE OP) Place 2 drops into both eyes daily as needed (for dry eyes).     [name] TIVICAY 50 MG tablet TAKE 1 TABLET BY MOUTH DAILY 30 tablet 5   No current facility-administered medications for this visit.     Allergies:   Tree extract and Ciprofloxacin    Social History:  The patient  reports that  has never smoked. she has never used smokeless tobacco. She reports that she drinks alcohol. She reports that she does not use drugs.   Family History:  The patient's family history includes Asthma in her mother; Diabetes in her sister; Heart failure in her mother; Heart murmur in her brother, sister, and sister; Thyroid disease in her sister.    ROS:  Please see the history of present illness.   Otherwise, review of systems are positive for none   All other systems are reviewed and negative.   PHYSICAL EXAM: VS:  There were no vitals taken for this visit. , BMI There is no height or weight on file to calculate BMI. GENERAL:  Well appearing.  No acute distress HEENT: Pupils equal round and reactive, fundi not visualized, oral mucosa  unremarkable NECK:  JVD 1cm above the clavicle at 45 degrees. Waveform within normal limits, carotid upstroke brisk and symmetric, no bruits, no thyromegaly LUNGS:  Clear to auscultation bilaterally.  No crackles, rhonchi or wheezes.  HEART:  RRR.  PMI not displaced or sustained,S1 and S2 within normal limits, no S3, no S4, no clicks, no rubs, no murmurs ABD:  Flat, positive bowel sounds normal in frequency in pitch, no bruits, no rebound, no guarding, no midline pulsatile mass, no hepatomegaly, no splenomegaly.  RUQ TTP EXT:  2 plus pulses throughout, 1+ pitting edema to the upper tibia bilaterally, no cyanosis no clubbing SKIN:  No rashes no nodules NEURO:  Cranial nerves II through XII grossly intact, motor grossly intact throughout PSYCH:  Cognitively intact, oriented to person place and time   EKG:  EKG is ordered today. The ekg ordered 12/31/15 shows sinus rhythm. Rate 89 bpm. 2 PVCs. 10/28/16: Sinus rhythm. Rate 93 bpm 07/01/17: Sinus tachycardia.  Rate 115 bpm.  LAD.    Coronary angiography 06/01/12: 10-20% OM, diffuse 20-30% LAD, 10-20% D1, 20% diffuse RCA RA 10, RV 39/12, RV EDP 12, capillary wedge pressure 19, PA 39/21, mean 27, LV 116/14, LVEDP 20, aorta 115/77 Cardiac output (Fick) 6.55, cardiac index 3.2 PVR 1.7 with units, SVR 12 with units. She P/QRS ratio 1 LVEF 80%. Aortic root mildly dilated. 1-2+ aortic regurgitation.  24-hour Holter 09/26/13: Sinus rhythm, rare PACs, rare PVCs  Echo 01/22/15: LVEF 70%. Mild LVH. Mild diastolic dysfunction. Normal RV function. Left atrium moderately enlarged. Right atrium mildly enlarged. Trace mitral regurgitation. Calcific aortic valve. Mild to moderate aortic regurgitation. Mild aortic stenosis. Peak velocity 2.1 m/s trace tricuspid regurgitation    Lexiscan Myoview 01/2016:   The left ventricular ejection fraction is normal (55-65%). The EF is 60% visually. The computer generated EF is not calculated correctly and is therefore not  reported.  There was no ST segment deviation noted during stress.  The study is normal. no ischemia . no infarction   Echo 01/29/16: Study  Conclusions  - Left ventricle: The cavity size was normal. Wall thickness was   increased in a pattern of moderate LVH. Systolic function was   normal. The estimated ejection fraction was in the range of 60%   to 65%. Basal inferior hypokinesis. Doppler parameters are   consistent with abnormal left ventricular relaxation (grade 1   diastolic dysfunction). - Aortic valve: Trileaflet; moderately calcified leaflets. There   was very mild stenosis. There was mild regurgitation. Mean   gradient (S): 9 mm Hg. Valve area (VTI): 1.68 cm^2. - Mitral valve: There was trivial regurgitation. - Left atrium: The atrium was mildly dilated. - Right ventricle: The cavity size was normal. Systolic function   was normal. - Tricuspid valve: Peak RV-RA gradient (S): 44 mm Hg. - Pulmonary arteries: PA peak pressure: 52 mm Hg (S). - Systemic veins: IVC measured 2.3 cm with > 50% respirophasic   variation, suggesting RA pressure 8 mmHg.  Impressions:  - Normal LV size with moderate LV hypertrophy. EF 60-65%. Basal   inferior hypokinesis. Normal RV size and systolic function. Very   mild aortic stenosis, mild aortic insufficieny. Moderate   pulmonary hypertension.   Recent Labs: 01/28/2017: B Natriuretic Peptide 71.0 05/20/2017: ALT 17; BUN 13; Creatinine, Ser 1.22; Potassium 3.8; Sodium 139 07/12/2017: Hemoglobin 13.0; Platelets 192.0    Lipid Panel    Component Value Date/Time   CHOL 217 (H) 11/26/2016 1618   TRIG 88 11/26/2016 1618   HDL 67 11/26/2016 1618   CHOLHDL 3.2 11/26/2016 1618   VLDL 18 11/26/2016 1618   LDLCALC 132 (H) 11/26/2016 1618      Wt Readings from Last 3 Encounters:  07/12/17 104.8 kg (231 lb)  07/01/17 102.6 kg (226 lb 3.2 oz)  05/28/17 99.7 kg (219 lb 14.4 oz)      ASSESSMENT AND PLAN:  # Atrial flutter: Converted with  flecainide.  Continue diltiazem, carvedilol and Xarelto.   # Hypertension:  BP above goal again.  She is back to taking carvedilol once daily.  I tried to convince her to take 25 mg twice daily but she is reluctant.  She will start with 12.5 mg twice daily crease of her blood pressure remains greater than 130/80.  Continue carvedilol, diltiazem, losartan, and nifedipine.  We will also start Lasix 20 mg daily.  Check BMP at follow-up.  # Aortic regurgitation # Aortic stenosis: Ms. Garin has very mild aortic stenosis (mean gradient 9 mmHg) and mild aortic regurgitation.  Continue to monitor.    # Hyperlipidemia: Will address at follow up.  # Ascending aorta aneurysm: 4.0 on CT 12/2016.  Will re-assess 12/2017.  Current medicines are reviewed at length with the patient today.  The patient does not have concerns regarding medicines.  The following changes have been made:  Increase nifedipine to 90 mg daily.   Labs/ tests ordered today include:   No orders of the defined types were placed in this encounter.    Disposition:   FU with Sharrod Achille C. Oval Linsey, MD, [facility] in 2 weeks.   Time spent: 30 minutes-Greater than 50% of this time was spent in counseling, explanation of diagnosis, planning of further management, and coordination of care.   Signed, Irva Loser C. Oval Linsey, MD, [facility]  07/19/2017 11:53 AM    [place]

## 2017-07-20 ENCOUNTER — Ambulatory Visit: Payer: Medicare Other | Admitting: Cardiovascular Disease

## 2017-07-21 ENCOUNTER — Other Ambulatory Visit: Payer: Self-pay

## 2017-07-21 ENCOUNTER — Encounter: Payer: Self-pay | Admitting: Internal Medicine

## 2017-07-21 ENCOUNTER — Ambulatory Visit (INDEPENDENT_AMBULATORY_CARE_PROVIDER_SITE_OTHER): Payer: Medicare Other | Admitting: Internal Medicine

## 2017-07-21 DIAGNOSIS — I10 Essential (primary) hypertension: Secondary | ICD-10-CM

## 2017-07-21 DIAGNOSIS — G4710 Hypersomnia, unspecified: Secondary | ICD-10-CM

## 2017-07-21 DIAGNOSIS — I483 Typical atrial flutter: Secondary | ICD-10-CM | POA: Diagnosis not present

## 2017-07-21 DIAGNOSIS — F411 Generalized anxiety disorder: Secondary | ICD-10-CM | POA: Diagnosis not present

## 2017-07-21 DIAGNOSIS — N281 Cyst of kidney, acquired: Secondary | ICD-10-CM | POA: Diagnosis not present

## 2017-07-21 DIAGNOSIS — J209 Acute bronchitis, unspecified: Secondary | ICD-10-CM

## 2017-07-21 NOTE — Assessment & Plan Note (Signed)
She now reports that she is having insomnia, but on review of her sleep habits she is getting enough (> 8 hours) sleep, however, she is just not happy with the time frame in which she is sleeping.   We discussed sleep extinction, changing her wake up time to encourage earlier to sleep time.  Decrease caffeine.  Decrease TV before bed.  Take melatonin with dinner.  She will let me know if these changes helped.

## 2017-07-21 NOTE — Progress Notes (Signed)
   Subjective:    Patient ID: Shannon [name], female    DOB: [birth year], 61 y.o.   MRN: [id number]  2 month follow up for HTN  HPI  Ms. Rooke is a 61yo woman with PMH of HTN, GAD, aflutter, CAD, CKD, asthma/COPD, HIV who presents for follow up.   She was recently seen in Pulmonology on 11/20 for acute bronchitis and was placed on Abx for this along with a steroid taper.  She states that she is feeling much better, but she feels the steroids are making her eat more and keeping her from sleeping sometimes.    Today she complaints of insomnia.  She notes that she will cat nap in the afternoons from 330 - 7pm and this will cause her to be up from 7pm until about 3 in the morning when she finally falls asleep.  She watches TV until she falls asleep.  She is then sleeping until 11am.  Based on these hours, she is getting adequate sleep (about 8 hours), but she would prefer to sleep during the night and be up during the day.  We discussed sleep extinction.  Avoiding TV late at night.  Timing of melatonin dosing and caffeine.    She has a cyst on her wrist that her surgeon would like to remove.  I advised her to get clearance from her cardiologist and pulmonologist.    Otherwise, she is doing well. She continues to have multiple complaints re: fat deposition, back pain and concern that her kidneys are not doing well, but these were not main topics of discussion today.    Review of Systems  Constitutional: Positive for activity change (more tired during the day). Negative for fatigue and fever.  Respiratory: Negative for cough, shortness of breath and wheezing.   Cardiovascular: Negative for chest pain and leg swelling.  Musculoskeletal: Positive for arthralgias, back pain and myalgias.  Skin:       Cyst on right wrist  Neurological: Negative for dizziness, weakness and headaches.       Objective:   Physical Exam  Constitutional: She is oriented to person, place, and time. She appears  well-developed and well-nourished. No distress.  HENT:  Head: Normocephalic and atraumatic.  Cardiovascular: Normal rate and regular rhythm.  Murmur heard. Pulmonary/Chest: Effort normal. No respiratory distress. She has no wheezes.  Decreased breath sounds throughout  Musculoskeletal: She exhibits edema (1+ to mid calf) and tenderness.  Neurological: She is alert and oriented to person, place, and time.  Skin: Skin is warm and dry. No erythema.  She has a quarter sized healing wound on left shin which she noted she bumped during the blackout.  No redness or pain to palpation.  She has a large 1cm X 1 cm on right dorsal wrist which is tender  Psychiatric: She has a normal mood and affect. Her behavior is normal.  Vitals reviewed.         Assessment & Plan:  RTC in 2 months for BP check  Cyst on wrist - Orthopedics would like to remove, will need review by Cardiology and Pulmonology to see if she is a candidate for surgery.  I advised her to follow up as planned with these specialists.

## 2017-07-21 NOTE — Assessment & Plan Note (Signed)
Today pulse was 72.  She is following with Cardiology.  She has stopped her Xarelto for a few days due to thinking it was making her cold and slow wound healing on her shin (wound is almost completely healed now).  I encouraged her to restart her medication.   Plan Continue coreg and diltiazem Continue xarelto.

## 2017-07-21 NOTE — Patient Instructions (Signed)
Shannon [name] - -  Thank you for coming in today.   Please continue to take all of your blood pressure medications and check your blood pressure daily.  Please bring your blood pressure log in at next visit.   For your insomnia - -  You are getting enough sleep every day, but I know you would rather be sleeping at night.   Try taking the melatonin at dinner time.  You can take this every day if you would like.   To move your sleep time, try setting an alarm to wake up one hour earlier (10am) for a week.  This should encourage you to go to sleep earlier in the evening.  Then set your alarm back another hour (9am) and so on.  Do this for a few weeks until you are waking up at your desired time.  Also avoid all caffeine after 3pm and try to eat your last meal well before bedtime (2-3 hours).   Come back to see me in 2 months.

## 2017-07-21 NOTE — Assessment & Plan Note (Signed)
She has not had her MRI yet, I think she likely does not remember to schedule.    I have sent a note to our scheduler to see if this needs to be rescheduled.    Plan Follow up MRI abdomen when done.

## 2017-07-21 NOTE — Assessment & Plan Note (Signed)
She is only taking the citalopram PRN.  I advised her to take daily, but she is sceptical of this.   Plan Continue citalopram 10mg  daily.

## 2017-07-21 NOTE — Assessment & Plan Note (Signed)
Symptoms are improving.  She has no wheezing on exam today.    Plan Follow up as scheduled with pulmonology Finish prednisone course.

## 2017-07-21 NOTE — Assessment & Plan Note (Signed)
BP today is elevated, she has not taken her BP medications.  She is supposed to be on coreg, diltiazem, losartan, nifedipine, lasix and KCl.  She reports taking them daily.  She notes that her BP at home is usually less than 140/90 (she did not bring in her log), but recently has been higher given increased salt intake and prednisone.   Plan Encouraged her to bring in her BP log Continue current 5 drug regimen.

## 2017-07-22 ENCOUNTER — Ambulatory Visit: Payer: Medicare Other | Admitting: Internal Medicine

## 2017-07-23 ENCOUNTER — Ambulatory Visit: Payer: Medicare Other | Admitting: Adult Health

## 2017-07-23 ENCOUNTER — Ambulatory Visit: Payer: Medicare Other | Admitting: Cardiovascular Disease

## 2017-07-24 ENCOUNTER — Other Ambulatory Visit: Payer: Self-pay | Admitting: Internal Medicine

## 2017-07-24 DIAGNOSIS — B20 Human immunodeficiency virus [HIV] disease: Secondary | ICD-10-CM

## 2017-08-04 ENCOUNTER — Telehealth: Payer: Self-pay | Admitting: *Deleted

## 2017-08-04 NOTE — Telephone Encounter (Signed)
   [facility] Pre-operative Risk Assessment    Request for surgical clearance:  1. What type of surgery is being performed? RIGHT WRIST DORSAL CARPAL GANGLION EXCISION,  EXCISION TUMOR SOFT TISSUE FOREARM WRIST DEEP, EXCISION GANGLION WRIST PRIMARY-DORSAL OR VOLAR    2. When is this surgery scheduled? TBD   3. Are there any medications that need to be held prior to surgery and how long?XARELTO   4. Practice name and name of physician performing surgery? [facility]    5. What is your office phone and fax number? [facility]    6. Anesthesia type (None, local, MAC, general) ? UNKNOWN

## 2017-08-05 NOTE — Telephone Encounter (Signed)
   Primary Cardiologist:No primary care provider on file.  Chart reviewed as part of pre-operative protocol coverage. Because of Shannon [name]'s past medical history, time since last visit, and current complaints of possible volume overload/SOB when contacted on the phone today, she will require a follow-up visit in order to better assess preoperative cardiovascular risk.  Pre-op covering staff: - Please schedule appointment and call patient to inform them. - Please contact requesting surgeon's office via preferred method (i.e, phone, fax) to inform them of need for appointment prior to surgery.  Christell Faith, PA-C  08/05/2017, 3:10 PM

## 2017-08-06 NOTE — Telephone Encounter (Signed)
Tried to reach Shannon [name] re: cardiac clearance, that she would need to make an appt before she could be cleared. Shannon [name] had no voicemail or answering machine. Will try again later.

## 2017-08-09 ENCOUNTER — Telehealth: Payer: Self-pay | Admitting: Internal Medicine

## 2017-08-09 ENCOUNTER — Encounter: Payer: Self-pay | Admitting: Physician Assistant

## 2017-08-09 ENCOUNTER — Telehealth: Payer: Self-pay | Admitting: *Deleted

## 2017-08-09 NOTE — Telephone Encounter (Signed)
Patient called and wants to have an appointment to see Shannon [name] advised her she has an appointment for 08/11/17 at 930. She advised she is in so much pain that she is going to the ED. 10/10 she advised she can not move and her right side of face is numbness. Her neighbor is with her and her niece is on the way to bring her to the ED. Advised we will check on her tomorrow.

## 2017-08-09 NOTE — Telephone Encounter (Signed)
Pt scheduled to see Melina Copa, PA-C 08/10/17.  Pt thanked me for the call.

## 2017-08-09 NOTE — Telephone Encounter (Signed)
Patient reports "excruciating pain in her back". Everything feels heavy on my spine. She is advised to proceed to ED.  She reports that the pain is so extreme she can't bend or sit comfortably.   She asked that I send a copy of this to Dr. Velvet Bathe. She agrees that she will go to the ED tonight

## 2017-08-09 NOTE — Progress Notes (Deleted)
Cardiology Office Note    Date:  08/09/2017  ID:  ARYIANNA EARWOOD, DOB Dec 10, 1955, MRN [id number] PCP:  [name], MD  Cardiologist:  Dr. Oval Linsey   Chief Complaint: pre-op eval  History of Present Illness:  LONDAN COPLEN is a 61 y.o. female with history of hypertension, paroxysmal atrial flutter, asthma, non-obstructive CAD 2013, OSA, prior PE, moderate pulmonary hypertension, mild ascending aorta aneurysm (4.0cm on CT 12/2016), CKD stage III by labs, HIV who presents for pre-operative evaluation.  She has been followed for edema and dyspnea in the past with cardiac workup including nuclear stress tests, cath, and echo. Cardiac cath in 2013 showed mild nonobstructive CAD with mildly dilated aortic root, 10-20% OM lesions, diffuse 20-30% LAD, 10-20% diagonal, 20% RCA. Last nuc in 12/2015 was normal. Last echo 12/2016 showed EF 60-65%, grade 1 DD, mild-moderate aortic insufficiency, biatrial enlargement. She had a 24-hour Holter in February 2015 that revealed rare PACs and PVCs. [name] was admitted to the hospital 01/2017 with hypertensive emergency and chest pain with BP 215/100. It was noted that she hasn't been taking her medication as prescribed.  HCTZ was discontinued. She has been taking carvedilol 25mg  daily instead of BID. She was also seen in the AF clinic in 02/2017 following an episode of atrial flutter treated with flecianinde. She was started on Eliquis but is now on Xarelto. At last OV 07/01/17 she reported increased edema and dyspnea. Blood pressure was 158/64 and HR 115. She was recommended to take coreg BID but was unwilling so she compromised on 12.5mg  BID, also with addition of Lasix 20mg  daily. HR was 115 at that visit. We received pre-op clearance for ganglion cyst. Given multitude of issues f/u was recommended. Last labs 04/2017 showed Cr 1.30, K 3.8, CBC wnl, LFTS ok, prior LDL 132 in 11/2016.  lipids pcp F/u anurysm TR  Pre-operative evaluation Nonobstructive  CAD Paroxysmal atrial flutter Essential HTN Mild ascending aortic aneurysm    Past Medical History:  Diagnosis Date  . Anemia   . Anxiety    HX PANIC ATTACKS  . Arthritis    "starting to; in my hands" (07/09/2015)  . Atrial flutter, paroxysmal ([place])   . Chronic asthma with acute exacerbation    "I have chronic asthma all the time; sometimes exacerbations" (07/09/2015)  . Chronic lower back pain   . Cyst of right kidney    "3 of them; dx'd in ~ 01/2015"  . GERD (gastroesophageal reflux disease)   . Heart murmur   . History of blood transfusion    "related to my brain surgery I think"  . History of pulmonary embolism 07/09/2015  . HIV antibody positive ([place])   . HIV disease ([place])   . Hyperlipidemia   . Hypertension   . Lipodystrophy   . Mild CAD 2013  . Pneumonia 07/09/2015  . Shingles   . Sleep apnea    "never completed part 2 of study; never wore mask" (07/09/2015)    Past Surgical History:  Procedure Laterality Date  . ABDOMINAL HYSTERECTOMY     "robotic laparosopic"  . BRAIN SURGERY  1974   "brain tumor; benign; on top of my brain; got a plate in there"  . CARDIAC CATHETERIZATION    . TONSILLECTOMY AND ADENOIDECTOMY      Current Medications: No outpatient medications have been marked as taking for the 08/10/17 encounter (Appointment) with Charlie Pitter, PA-C.     Allergies:   Tree extract and Ciprofloxacin  Social History   Socioeconomic History  . Marital status: Divorced    Spouse name: Not on file  . Number of children: Not on file  . Years of education: Not on file  . Highest education level: Not on file  Social Needs  . Financial resource strain: Not on file  . Food insecurity - worry: Not on file  . Food insecurity - inability: Not on file  . Transportation needs - medical: Not on file  . Transportation needs - non-medical: Not on file  Occupational History  . Occupation: Ship broker  Tobacco Use  . Smoking status: Never Smoker  . Smokeless  tobacco: Never Used  Substance and Sexual Activity  . Alcohol use: Yes    Alcohol/week: 0.0 oz    Comment: Rarely.  . Drug use: No  . Sexual activity: Not Currently    Partners: Male    Comment: declined condoms  Other Topics Concern  . Not on file  Social History Narrative   Originally from [place] then [place] now [place] 2016 approx   Disabled but going to [place] [address]   Updated 01/13/2017     Family History:  Family History  Problem Relation Age of Onset  . Asthma Mother   . Heart failure Mother        cardiomyopathy  . Heart murmur Sister   . Heart murmur Brother   . Diabetes Sister   . Thyroid disease Sister   . Heart murmur Sister    ***  ROS:   Please see the history of present illness. Otherwise, review of systems is positive for ***.  All other systems are reviewed and otherwise negative.    PHYSICAL EXAM:   VS:  There were no vitals taken for this visit.  BMI: There is no height or weight on file to calculate BMI. GEN: Well nourished, well developed, in no acute distress  HEENT: normocephalic, atraumatic Neck: no JVD, carotid bruits, or masses Cardiac: ***RRR; no murmurs, rubs, or gallops, no edema  Respiratory:  clear to auscultation bilaterally, normal work of breathing GI: soft, nontender, nondistended, + BS MS: no deformity or atrophy  Skin: warm and dry, no rash Neuro:  Alert and Oriented x 3, Strength and sensation are intact, follows commands Psych: euthymic mood, full affect  Wt Readings from Last 3 Encounters:  07/21/17 226 lb 12.8 oz (102.9 kg)  07/12/17 231 lb (104.8 kg)  07/01/17 226 lb 3.2 oz (102.6 kg)      Studies/Labs Reviewed:   EKG:  EKG was ordered today and personally reviewed by me and demonstrates *** EKG was not ordered today.***  Recent Labs: 01/28/2017: B Natriuretic Peptide 71.0 05/20/2017: ALT 17; BUN 13; Creatinine, Ser 1.22; Potassium 3.8; Sodium 139 07/12/2017: Hemoglobin 13.0; Platelets 192.0    Lipid Panel    Component Value Date/Time   CHOL 217 (H) 11/26/2016 1618   TRIG 88 11/26/2016 1618   HDL 67 11/26/2016 1618   CHOLHDL 3.2 11/26/2016 1618   VLDL 18 11/26/2016 1618   LDLCALC 132 (H) 11/26/2016 1618    Additional studies/ records that were reviewed today include: Summarized above.***    ASSESSMENT & PLAN:   1. ***  Disposition: F/u with ***   Medication Adjustments/Labs and Tests Ordered: Current medicines are reviewed at length with the patient today.  Concerns regarding medicines are outlined above. Medication changes, Labs and Tests ordered today are summarized above and listed in the Patient Instructions accessible in Encounters.  Signed, Charlie Pitter, PA-C  08/09/2017 5:15 PM    [facility] [place], [place], [place]  [address] Phone: [phone]; Fax: [phone]

## 2017-08-10 ENCOUNTER — Ambulatory Visit: Payer: Medicare Other | Admitting: Physician Assistant

## 2017-08-11 ENCOUNTER — Telehealth: Payer: Self-pay | Admitting: Internal Medicine

## 2017-08-11 ENCOUNTER — Encounter: Payer: Self-pay | Admitting: Internal Medicine

## 2017-08-11 ENCOUNTER — Ambulatory Visit: Payer: Medicare Other | Admitting: Internal Medicine

## 2017-08-11 NOTE — Telephone Encounter (Signed)
Called patient re: request for surgical evaluation by Orthopedics.    Patient reports that she is going to be evaluated by Dr. Oval Linsey her Cardiologist before surgery.   She also notes that she is having issues with her back.  She is having severe back pain, given steroids by her neurologist.  Going to see neurology on Friday.  She feels she has "too much weight" on for her back.  She is wearing a back brace.  She is supposed to be getting an MRI.  I encouraged her to follow up with Neurology as scheduled.  Sx have been going on for about 1 week.    Having healing issues on lower leg (healing at last visit).  Reports that it is inflamed because of weight on her legs.  I advised her to come in to our [facility] if she can for evaluation.    Gilles Chiquito, MD

## 2017-08-12 ENCOUNTER — Encounter: Payer: Self-pay | Admitting: Physician Assistant

## 2017-08-12 ENCOUNTER — Telehealth: Payer: Self-pay

## 2017-08-12 DIAGNOSIS — J189 Pneumonia, unspecified organism: Secondary | ICD-10-CM | POA: Diagnosis not present

## 2017-08-12 DIAGNOSIS — G4733 Obstructive sleep apnea (adult) (pediatric): Secondary | ICD-10-CM | POA: Diagnosis not present

## 2017-08-12 NOTE — Telephone Encounter (Signed)
F[facility]ed [facility] @ [phone] on 08/12/2017.

## 2017-08-12 NOTE — Telephone Encounter (Signed)
No answer/machine.  Will try again at a later date and time

## 2017-08-13 DIAGNOSIS — M4727 Other spondylosis with radiculopathy, lumbosacral region: Secondary | ICD-10-CM | POA: Diagnosis not present

## 2017-08-13 DIAGNOSIS — M5416 Radiculopathy, lumbar region: Secondary | ICD-10-CM | POA: Diagnosis not present

## 2017-08-16 NOTE — Telephone Encounter (Signed)
Attempted to reach the patient no voicemail. I will await a return call

## 2017-08-18 ENCOUNTER — Emergency Department (HOSPITAL_COMMUNITY)
Admission: EM | Admit: 2017-08-18 | Discharge: 2017-08-18 | Disposition: A | Discharge status: Home / Self Care | Payer: Medicare Other | Attending: Emergency Medicine | Admitting: Emergency Medicine

## 2017-08-18 ENCOUNTER — Emergency Department (HOSPITAL_COMMUNITY): Payer: Medicare Other

## 2017-08-18 ENCOUNTER — Other Ambulatory Visit: Payer: Self-pay | Admitting: Neurological Surgery

## 2017-08-18 ENCOUNTER — Encounter (HOSPITAL_COMMUNITY): Payer: Self-pay | Admitting: Emergency Medicine

## 2017-08-18 DIAGNOSIS — R42 Dizziness and giddiness: Secondary | ICD-10-CM | POA: Diagnosis not present

## 2017-08-18 DIAGNOSIS — M4727 Other spondylosis with radiculopathy, lumbosacral region: Secondary | ICD-10-CM

## 2017-08-18 DIAGNOSIS — J45909 Unspecified asthma, uncomplicated: Secondary | ICD-10-CM | POA: Insufficient documentation

## 2017-08-18 DIAGNOSIS — M545 Low back pain: Secondary | ICD-10-CM | POA: Insufficient documentation

## 2017-08-18 DIAGNOSIS — R079 Chest pain, unspecified: Secondary | ICD-10-CM | POA: Diagnosis not present

## 2017-08-18 DIAGNOSIS — R1111 Vomiting without nausea: Secondary | ICD-10-CM | POA: Diagnosis not present

## 2017-08-18 DIAGNOSIS — J449 Chronic obstructive pulmonary disease, unspecified: Secondary | ICD-10-CM | POA: Diagnosis not present

## 2017-08-18 DIAGNOSIS — D649 Anemia, unspecified: Secondary | ICD-10-CM | POA: Insufficient documentation

## 2017-08-18 DIAGNOSIS — Z7901 Long term (current) use of anticoagulants: Secondary | ICD-10-CM | POA: Insufficient documentation

## 2017-08-18 DIAGNOSIS — I4891 Unspecified atrial fibrillation: Secondary | ICD-10-CM | POA: Diagnosis not present

## 2017-08-18 DIAGNOSIS — I129 Hypertensive chronic kidney disease with stage 1 through stage 4 chronic kidney disease, or unspecified chronic kidney disease: Secondary | ICD-10-CM | POA: Diagnosis not present

## 2017-08-18 DIAGNOSIS — Z79899 Other long term (current) drug therapy: Secondary | ICD-10-CM | POA: Diagnosis not present

## 2017-08-18 DIAGNOSIS — R112 Nausea with vomiting, unspecified: Secondary | ICD-10-CM | POA: Diagnosis not present

## 2017-08-18 DIAGNOSIS — R0981 Nasal congestion: Secondary | ICD-10-CM

## 2017-08-18 DIAGNOSIS — N183 Chronic kidney disease, stage 3 (moderate): Secondary | ICD-10-CM | POA: Insufficient documentation

## 2017-08-18 DIAGNOSIS — B20 Human immunodeficiency virus [HIV] disease: Secondary | ICD-10-CM | POA: Insufficient documentation

## 2017-08-18 DIAGNOSIS — R0789 Other chest pain: Secondary | ICD-10-CM | POA: Insufficient documentation

## 2017-08-18 DIAGNOSIS — G8929 Other chronic pain: Secondary | ICD-10-CM | POA: Diagnosis not present

## 2017-08-18 DIAGNOSIS — J80 Acute respiratory distress syndrome: Secondary | ICD-10-CM | POA: Diagnosis not present

## 2017-08-18 DIAGNOSIS — I2510 Atherosclerotic heart disease of native coronary artery without angina pectoris: Secondary | ICD-10-CM | POA: Insufficient documentation

## 2017-08-18 LAB — COMPREHENSIVE METABOLIC PANEL
ALBUMIN: 3.3 g/dL — AB (ref 3.5–5.0)
ALK PHOS: 58 U/L (ref 38–126)
ALT: 17 U/L (ref 14–54)
ANION GAP: 7 (ref 5–15)
AST: 23 U/L (ref 15–41)
BILIRUBIN TOTAL: 0.7 mg/dL (ref 0.3–1.2)
BUN: 15 mg/dL (ref 6–20)
CALCIUM: 9.5 mg/dL (ref 8.9–10.3)
CO2: 33 mmol/L — ABNORMAL HIGH (ref 22–32)
Chloride: 99 mmol/L — ABNORMAL LOW (ref 101–111)
Creatinine, Ser: 1.38 mg/dL — ABNORMAL HIGH (ref 0.44–1.00)
GFR calc Af Amer: 46 mL/min — ABNORMAL LOW (ref >60)
GFR calc non Af Amer: 40 mL/min — ABNORMAL LOW (ref >60)
GLUCOSE: 136 mg/dL — AB (ref 65–99)
Potassium: 3.4 mmol/L — ABNORMAL LOW (ref 3.5–5.1)
Sodium: 139 mmol/L (ref 135–145)
TOTAL PROTEIN: 6.1 g/dL — AB (ref 6.5–8.1)

## 2017-08-18 LAB — CBC WITH DIFFERENTIAL/PLATELET
BASOS ABS: 0 10*3/uL (ref 0.0–0.1)
BASOS PCT: 0 %
EOS ABS: 0.1 10*3/uL (ref 0.0–0.7)
EOS PCT: 1 %
HCT: 38.8 % (ref 36.0–46.0)
Hemoglobin: 12.4 g/dL (ref 12.0–15.0)
Lymphocytes Relative: 23 %
Lymphs Abs: 1.5 10*3/uL (ref 0.7–4.0)
MCH: 30.7 pg (ref 26.0–34.0)
MCHC: 32 g/dL (ref 30.0–36.0)
MCV: 96 fL (ref 78.0–100.0)
MONO ABS: 0.4 10*3/uL (ref 0.1–1.0)
Monocytes Relative: 5 %
NEUTROS ABS: 4.6 10*3/uL (ref 1.7–7.7)
Neutrophils Relative %: 71 %
PLATELETS: 185 10*3/uL (ref 150–400)
RBC: 4.04 MIL/uL (ref 3.87–5.11)
RDW: 13.3 % (ref 11.5–15.5)
WBC: 6.6 10*3/uL (ref 4.0–10.5)

## 2017-08-18 LAB — I-STAT TROPONIN, ED
TROPONIN I, POC: 0.01 ng/mL (ref 0.00–0.08)
Troponin i, poc: 0 ng/mL (ref 0.00–0.08)

## 2017-08-18 LAB — D-DIMER, QUANTITATIVE (NOT AT ARMC): D DIMER QUANT: 0.27 ug{FEU}/mL (ref 0.00–0.50)

## 2017-08-18 MED ORDER — FLUTICASONE PROPIONATE 50 MCG/ACT NA SUSP: 1.0000 | Freq: Every day | NASAL | 0 refills | Status: DC

## 2017-08-18 MED ORDER — ONDANSETRON HCL 4 MG/2ML IJ SOLN
4.0000 mg | Freq: Once | INTRAMUSCULAR | Status: AC
  Administered 2017-08-18: 4 mg via INTRAVENOUS
  Filled 2017-08-18: qty 2

## 2017-08-18 MED ORDER — IPRATROPIUM-ALBUTEROL 0.5-2.5 (3) MG/3ML IN SOLN
3.0000 mL | Freq: Once | RESPIRATORY_TRACT | Status: AC
  Administered 2017-08-18: 3 mL via RESPIRATORY_TRACT
  Filled 2017-08-18: qty 3

## 2017-08-18 MED ORDER — SODIUM CHLORIDE 0.9 % IV BOLUS (SEPSIS)
500.0000 mL | Freq: Once | INTRAVENOUS | Status: AC
  Administered 2017-08-18: 500 mL via INTRAVENOUS

## 2017-08-18 NOTE — ED Notes (Signed)
Pt given gingerale for fluid challenge. Pt tolerating well. Will continue to monitor.

## 2017-08-18 NOTE — ED Notes (Signed)
Patient transported to X-ray 

## 2017-08-18 NOTE — ED Notes (Signed)
ED Provider at bedside. 

## 2017-08-18 NOTE — ED Triage Notes (Signed)
Ems pt from home pt called out for nausea, vomiting and dizziness with an episode of chest pain post vomiting. PT denies current pain. Pt was given 4mg  Zofran IV PTA

## 2017-08-18 NOTE — ED Notes (Addendum)
PT had incontinence episode. She was wheeled into empty room and was able to changed herself. RN gave brief and paper scrubs. PTAR here to get patient; primary RN unable to print forms and give report. RN provided forms. Pt on 2 L [place]. She reports her concentrator is on 2nd floor and she needs it on first. RN instructed her to call [facility] on the way and have them come move it it ASAP. She verbalized understanding and agreed and has their number.

## 2017-08-18 NOTE — Discharge Instructions (Signed)
Please follow-up closely with your primary care provider. Drink plenty of fluids and get rest. Your blood work has been very reassuring. This may be a viral illness that will take 1-2 weeks to get better. Return for worsening symptoms, including confusion, fevers, passing out, or any other symptoms concerning to you.

## 2017-08-18 NOTE — ED Provider Notes (Signed)
[facility] EMERGENCY DEPARTMENT Provider Note   CSN: [id number] Arrival date & time: 08/18/17  0244     History   Chief Complaint Chief Complaint  Patient presents with  . Chest Pain  . Nausea    HPI Shannon [name] is a 61 y.o. female.  61yo F w/ PMH including HIV on HAART, asthma, A flutter on Xarelto, GERD who p/w multiple complaints. Yesterday she began having intermittent pressure-like chest pain that has been right-sided, left-sided and sometimes radiating to neck and jaw. She reports that she always feels short of breath and this has not changed recently. She reports sweats and chills, dry mouth, poor sleep. She has had nausea and several episodes of vomiting tonight. She states she got dizzy and fell down, no head injury or LOC.  No cough. Reports low back pain x 2 weeks for which she saw neurosurgery spine specialist last week. She has been prescribed Xarelto but has not been taking it for the past week for multiple reasons including wound on lower left leg that isn't healing right. She requests wound care specialist. No bleeding. She also reports nasal congestion for 1 day and thinks she has a sinus infection.   The history is provided by the patient.  Chest Pain      Past Medical History:  Diagnosis Date  . Anemia   . Anxiety    HX PANIC ATTACKS  . Arthritis    "starting to; in my hands" (07/09/2015)  . Atrial flutter, paroxysmal ([place])   . Chronic asthma with acute exacerbation    "I have chronic asthma all the time; sometimes exacerbations" (07/09/2015)  . Chronic lower back pain   . Cyst of right kidney    "3 of them; dx'd in ~ 01/2015"  . GERD (gastroesophageal reflux disease)   . Heart murmur   . History of blood transfusion    "related to my brain surgery I think"  . History of pulmonary embolism 07/09/2015  . HIV antibody positive ([place])   . HIV disease ([place])   . Hyperlipidemia   . Hypertension   . Lipodystrophy   . Mild CAD 2013    . Pneumonia 07/09/2015  . Shingles   . Sleep apnea    "never completed part 2 of study; never wore mask" (07/09/2015)    Patient Active Problem List   Diagnosis Date Noted  . Acute bronchitis 07/13/2017  . Anticoagulated 05/26/2017  . Allergic rhinitis 05/14/2017  . Right flank pain 05/05/2017  . Atrial flutter ([place]) 03/11/2017  . CAD (coronary artery disease) 01/28/2017  . Morbid obesity with BMI of 40.0-44.9, adult ([place]) 01/13/2017  . On home oxygen therapy 01/13/2017  . Ascending aorta dilatation ([facility]) 01/07/2017  . COPD (chronic obstructive pulmonary disease) ([place]) 12/20/2016  . Low back pain radiating to right lower extremity 12/02/2016  . Bilateral renal cysts 12/02/2016  . Chronic kidney disease (CKD), stage III (moderate) ([place]) 12/01/2016  . Generalized anxiety disorder 10/22/2016  . Hypersomnia 10/01/2016  . BPPV (benign paroxysmal positional vertigo) 09/18/2016  . Nocturnal hypoxemia 05/13/2016  . Cervical radiculopathy 04/14/2016  . GERD (gastroesophageal reflux disease) 01/02/2016  . Vitamin B12 deficiency 10/02/2015  . Inappropriate diet and eating habits 08/15/2015  . Opacity noted on imaging study   . Dyspnea 07/09/2015  . Asthma, moderate persistent 07/09/2015  . HIV disease ([place]) 07/09/2015  . Essential hypertension 07/09/2015  . Mild diastolic dysfunction 10/93/2355    Past Surgical History:  Procedure Laterality Date  .  ABDOMINAL HYSTERECTOMY     "robotic laparosopic"  . BRAIN SURGERY  1974   "brain tumor; benign; on top of my brain; got a plate in there"  . CARDIAC CATHETERIZATION    . TONSILLECTOMY AND ADENOIDECTOMY      OB History    No data available       Home Medications    Prior to Admission medications   Medication Sig Start Date End Date Taking? Authorizing Provider  albuterol (PROAIR HFA) 108 (90 Base) MCG/ACT inhaler Inhale 2 puffs into the lungs every 4 (four) hours as needed for wheezing or shortness of breath. 05/05/17  Yes  [name], MD  albuterol (PROVENTIL) (2.5 MG/3ML) 0.083% nebulizer solution Take 3 mLs (2.5 mg total) by nebulization every 6 (six) hours as needed for wheezing or shortness of breath. 02/11/17  Yes Lorella Nimrod, MD  carvedilol (COREG) 12.5 MG tablet Take 1 tablet (12.5 mg total) 2 (two) times daily with a meal by mouth. 07/01/17  Yes Skeet Latch, MD  cyanocobalamin (,VITAMIN B-12,) 1000 MCG/ML injection Inject 1 mL (1,000 mcg total) into the muscle every 30 (thirty) days. 01/20/17  Yes [name], MD  DESCOVY 200-25 MG tablet TAKE 1 TABLET BY MOUTH DAILY 07/26/17  Yes [name], MD  diltiazem (CARDIZEM CD) 120 MG 24 hr capsule Take 1 capsule (120 mg total) by mouth daily. 07/13/17 10/11/17 Yes Skeet Latch, MD  fluticasone [facility]) 50 MCG/ACT nasal spray Place 2 sprays into both nostrils daily. Patient taking differently: Place 2 sprays into both nostrils daily as needed for allergies.  10/01/16  Yes Lucious Groves, DO  furosemide (LASIX) 20 MG tablet Take 1 tablet (20 mg total) daily by mouth. 07/01/17 09/29/17 Yes Skeet Latch, MD  loratadine (CLARITIN) 10 MG tablet Take 1 tablet (10 mg total) by mouth daily as needed for allergies. 06/10/16 08/18/17 Yes Alphonzo Grieve, MD  losartan (COZAAR) 100 MG tablet Take 1 tablet (100 mg total) by mouth at bedtime. hypertension 05/26/17  Yes Kilroy, Luke K, PA-C  Multiple Vitamin (MULTIVITAMIN WITH MINERALS) TABS tablet Take 1 tablet by mouth daily. 08/29/15  Yes Rice, Resa Miner, MD  NIFEdipine (ADALAT CC) 90 MG 24 hr tablet Take 1 tablet (90 mg total) by mouth daily. 02/10/17  Yes Skeet Latch, MD  OXYGEN Inhale 2.5-3 L into the lungs at bedtime as needed (Shortness of Breath). 2.5 to 3 liters at night and as needed in the daytime.    Yes [provider]  potassium chloride (K-DUR) 10 MEQ tablet Take 1 tablet (10 mEq total) daily by mouth. 07/01/17 09/29/17 Yes Skeet Latch, MD  rivaroxaban (XARELTO) 20 MG TABS  tablet Take 1 tablet (20 mg total) by mouth daily with supper. 05/05/17  Yes [name], MD  sodium chloride (OCEAN) 0.65 % SOLN nasal spray Place 1 spray into both nostrils as needed for congestion. 03/26/17  Yes Lorella Nimrod, MD  Tetrahydrozoline HCl (VISINE OP) Place 2 drops into both eyes daily as needed (for dry eyes).    Yes [provider]  TIVICAY 50 MG tablet TAKE 1 TABLET BY MOUTH DAILY 07/26/17  Yes [name], MD    Family History Family History  Problem Relation Age of Onset  . Asthma Mother   . Heart failure Mother        cardiomyopathy  . Heart murmur Sister   . Heart murmur Brother   . Diabetes Sister   . Thyroid disease Sister   . Heart murmur  Sister     Social History Social History   Tobacco Use  . Smoking status: Never Smoker  . Smokeless tobacco: Never Used  Substance Use Topics  . Alcohol use: Yes    Alcohol/week: 0.0 oz    Comment: Rarely.  . Drug use: No     Allergies   Tree extract and Ciprofloxacin   Review of Systems Review of Systems  Cardiovascular: Positive for chest pain.   All other systems reviewed and are negative except that which was mentioned in HPI   Physical Exam Updated Vital Signs BP (!) 172/97   Pulse 77   Temp 98 F (36.7 C) (Oral)   Resp (!) 21   Ht 5\' 3"  (1.6 m)   Wt 98 kg (216 lb)   SpO2 100%   BMI 38.26 kg/m   Physical Exam  Constitutional: She is oriented to person, place, and time. She appears well-developed and well-nourished. No distress.  HENT:  Head: Normocephalic and atraumatic.  Mouth/Throat: Oropharynx is clear and moist.  Moist mucous membranes  Eyes: Conjunctivae are normal.  Neck: Neck supple.  Cardiovascular: Normal rate, regular rhythm and normal heart sounds.  No murmur heard. Pulmonary/Chest: Effort normal and breath sounds normal.  Abdominal: Soft. Bowel sounds are normal. She exhibits no distension. There is no tenderness.  Musculoskeletal: She exhibits no edema.    Neurological: She is alert and oriented to person, place, and time.  Fluent speech  Skin: Skin is warm and dry.  Small circular superficial healing wound on L lower shin with no erythema, drainage, or tenderness  Psychiatric: Judgment normal.  Bizarre affect, slightly racing thoughts  Nursing note and vitals reviewed.    ED Treatments / Results  Labs (all labs ordered are listed, but only abnormal results are displayed) Labs Reviewed  COMPREHENSIVE METABOLIC PANEL - Abnormal; Notable for the following components:      Result Value   Potassium 3.4 (*)    Chloride 99 (*)    CO2 33 (*)    Glucose, Bld 136 (*)    Creatinine, Ser 1.38 (*)    Total Protein 6.1 (*)    Albumin 3.3 (*)    GFR calc non Af Amer 40 (*)    GFR calc Af Amer 46 (*)    All other components within normal limits  CBC WITH DIFFERENTIAL/PLATELET  D-DIMER, QUANTITATIVE (NOT AT [facility])  I-STAT TROPONIN, ED    EKG  EKG Interpretation  Date/Time:  Wednesday August 18 2017 03:04:23 EST Ventricular Rate:  78 PR Interval:    QRS Duration: 85 QT Interval:  400 QTC Calculation: 456 R Axis:   -2 Text Interpretation:  Sinus rhythm Abnormal R-wave progression, early transition ST elevation, consider inferior injury No significant change since last tracing Confirmed by Theotis Burrow [phone]) on 08/18/2017 3:14:01 AM       Radiology Dg Chest 2 View  Result Date: 08/18/2017 CLINICAL DATA:  Acute onset of right-sided chest pain and shortness of breath. Right upper back pain. EXAM: CHEST  2 VIEW COMPARISON:  Chest radiograph performed 05/20/2017, and CTA of the chest performed 01/28/2017 FINDINGS: The lungs are well-aerated. Minimal left midlung scarring is noted. There is no evidence of pleural effusion or pneumothorax. The heart is borderline enlarged. No acute osseous abnormalities are seen. IMPRESSION: No acute cardiopulmonary process seen. Electronically Signed   By: Garald Balding M.D.   On: 08/18/2017 04:03     Procedures Procedures (including critical care time)  Medications Ordered in ED Medications  ondansetron [facility]) injection 4 mg (4 mg Intravenous Given 08/18/17 0349)  sodium chloride 0.9 % bolus 500 mL (0 mLs Intravenous Stopped 08/18/17 0429)     Initial Impression / Assessment and Plan / ED Course  I have reviewed the triage vital signs and the nursing notes.  Pertinent labs & imaging results that were available during my care of the patient were reviewed by me and considered in my medical decision making (see chart for details).    PT here with multiple complaints, during conversation it was difficult to determine exactly which symptom brought her to ED. Her EKG shows no evidence of acute ischemia.  Initial troponin normal.  Creatinine 1.38 which is similar to previous.  CBC and d-dimer normal.  Chest x-ray negative for acute process.  We will obtain a 3-hour troponin.  If this is negative, I feel ACS is less likely given her atypical description of chest pain after vomiting, with no evidence of esophageal rupture and no major risk factors for PE. I have signed out to Dr. Oleta Mouse who will f/u on troponin. I anticipate discharge.   Final Clinical Impressions(s) / ED Diagnoses   Final diagnoses:  None    ED Discharge Orders    None       Ricky Gallery, Wenda Overland, MD 08/18/17 [phone]

## 2017-08-18 NOTE — ED Provider Notes (Signed)
Please see previous physicians note regarding patient's presenting history and physical, initial ED course, and associated medical decision making.   61 year old female who presents with nausea, vomiting, atypical chest pain, congestion.  Follow-up troponin is normal.  ED course is reviewed and she has reassuring blood work and EKG.  Very well controlled HIV. Possible viral process.  She will follow-up closely with her PCP. Strict return and follow-up instructions reviewed. She expressed understanding of all discharge instructions and felt comfortable with the plan of care.     Forde Dandy, MD 08/18/17 [phone]

## 2017-08-20 ENCOUNTER — Ambulatory Visit: Payer: Medicare Other

## 2017-08-23 ENCOUNTER — Ambulatory Visit: Payer: Medicare Other | Admitting: Cardiology

## 2017-08-25 ENCOUNTER — Emergency Department (HOSPITAL_COMMUNITY): Payer: Medicare Other

## 2017-08-25 ENCOUNTER — Emergency Department (HOSPITAL_COMMUNITY)
Admission: EM | Admit: 2017-08-25 | Discharge: 2017-08-25 | Disposition: A | Discharge status: Home / Self Care | Payer: Medicare Other | Attending: Emergency Medicine | Admitting: Emergency Medicine

## 2017-08-25 ENCOUNTER — Ambulatory Visit: Payer: Medicare Other

## 2017-08-25 ENCOUNTER — Other Ambulatory Visit: Payer: Self-pay

## 2017-08-25 DIAGNOSIS — R0600 Dyspnea, unspecified: Secondary | ICD-10-CM | POA: Insufficient documentation

## 2017-08-25 DIAGNOSIS — I2510 Atherosclerotic heart disease of native coronary artery without angina pectoris: Secondary | ICD-10-CM | POA: Insufficient documentation

## 2017-08-25 DIAGNOSIS — R42 Dizziness and giddiness: Secondary | ICD-10-CM | POA: Diagnosis not present

## 2017-08-25 DIAGNOSIS — I129 Hypertensive chronic kidney disease with stage 1 through stage 4 chronic kidney disease, or unspecified chronic kidney disease: Secondary | ICD-10-CM | POA: Diagnosis not present

## 2017-08-25 DIAGNOSIS — J0191 Acute recurrent sinusitis, unspecified: Secondary | ICD-10-CM | POA: Insufficient documentation

## 2017-08-25 DIAGNOSIS — J45909 Unspecified asthma, uncomplicated: Secondary | ICD-10-CM | POA: Diagnosis not present

## 2017-08-25 DIAGNOSIS — Z79899 Other long term (current) drug therapy: Secondary | ICD-10-CM | POA: Insufficient documentation

## 2017-08-25 DIAGNOSIS — R030 Elevated blood-pressure reading, without diagnosis of hypertension: Secondary | ICD-10-CM | POA: Diagnosis not present

## 2017-08-25 DIAGNOSIS — Z7901 Long term (current) use of anticoagulants: Secondary | ICD-10-CM | POA: Diagnosis not present

## 2017-08-25 DIAGNOSIS — J80 Acute respiratory distress syndrome: Secondary | ICD-10-CM | POA: Diagnosis not present

## 2017-08-25 DIAGNOSIS — J449 Chronic obstructive pulmonary disease, unspecified: Secondary | ICD-10-CM | POA: Diagnosis not present

## 2017-08-25 DIAGNOSIS — B20 Human immunodeficiency virus [HIV] disease: Secondary | ICD-10-CM | POA: Diagnosis not present

## 2017-08-25 DIAGNOSIS — G8929 Other chronic pain: Secondary | ICD-10-CM | POA: Insufficient documentation

## 2017-08-25 DIAGNOSIS — N183 Chronic kidney disease, stage 3 (moderate): Secondary | ICD-10-CM | POA: Diagnosis not present

## 2017-08-25 DIAGNOSIS — R0602 Shortness of breath: Secondary | ICD-10-CM | POA: Insufficient documentation

## 2017-08-25 DIAGNOSIS — D649 Anemia, unspecified: Secondary | ICD-10-CM | POA: Insufficient documentation

## 2017-08-25 DIAGNOSIS — R0789 Other chest pain: Secondary | ICD-10-CM | POA: Insufficient documentation

## 2017-08-25 DIAGNOSIS — I480 Paroxysmal atrial fibrillation: Secondary | ICD-10-CM | POA: Insufficient documentation

## 2017-08-25 DIAGNOSIS — R079 Chest pain, unspecified: Secondary | ICD-10-CM | POA: Diagnosis not present

## 2017-08-25 DIAGNOSIS — J019 Acute sinusitis, unspecified: Secondary | ICD-10-CM | POA: Diagnosis not present

## 2017-08-25 LAB — CBC WITH DIFFERENTIAL/PLATELET
Basophils Absolute: 0 10*3/uL (ref 0.0–0.1)
Basophils Relative: 0 %
EOS ABS: 0.1 10*3/uL (ref 0.0–0.7)
EOS PCT: 2 %
HCT: 37.6 % (ref 36.0–46.0)
Hemoglobin: 11.8 g/dL — ABNORMAL LOW (ref 12.0–15.0)
LYMPHS ABS: 1.7 10*3/uL (ref 0.7–4.0)
Lymphocytes Relative: 25 %
MCH: 30.2 pg (ref 26.0–34.0)
MCHC: 31.4 g/dL (ref 30.0–36.0)
MCV: 96.2 fL (ref 78.0–100.0)
MONOS PCT: 9 %
Monocytes Absolute: 0.6 10*3/uL (ref 0.1–1.0)
Neutro Abs: 4.3 10*3/uL (ref 1.7–7.7)
Neutrophils Relative %: 64 %
PLATELETS: 170 10*3/uL (ref 150–400)
RBC: 3.91 MIL/uL (ref 3.87–5.11)
RDW: 13.3 % (ref 11.5–15.5)
WBC: 6.8 10*3/uL (ref 4.0–10.5)

## 2017-08-25 LAB — URINALYSIS, ROUTINE W REFLEX MICROSCOPIC
Bilirubin Urine: NEGATIVE
Glucose, UA: NEGATIVE mg/dL
Hgb urine dipstick: NEGATIVE
KETONES UR: NEGATIVE mg/dL
LEUKOCYTES UA: NEGATIVE
NITRITE: NEGATIVE
PH: 7 (ref 5.0–8.0)
Protein, ur: NEGATIVE mg/dL
SPECIFIC GRAVITY, URINE: 1.005 (ref 1.005–1.030)

## 2017-08-25 LAB — BASIC METABOLIC PANEL
Anion gap: 6 (ref 5–15)
BUN: 13 mg/dL (ref 6–20)
CALCIUM: 9.5 mg/dL (ref 8.9–10.3)
CO2: 34 mmol/L — ABNORMAL HIGH (ref 22–32)
CREATININE: 1.2 mg/dL — AB (ref 0.44–1.00)
Chloride: 97 mmol/L — ABNORMAL LOW (ref 101–111)
GFR calc Af Amer: 55 mL/min — ABNORMAL LOW (ref >60)
GFR, EST NON AFRICAN AMERICAN: 47 mL/min — AB (ref >60)
Glucose, Bld: 109 mg/dL — ABNORMAL HIGH (ref 65–99)
Potassium: 4 mmol/L (ref 3.5–5.1)
SODIUM: 137 mmol/L (ref 135–145)

## 2017-08-25 LAB — HEPATIC FUNCTION PANEL
ALK PHOS: 54 U/L (ref 38–126)
ALT: 19 U/L (ref 14–54)
AST: 24 U/L (ref 15–41)
Albumin: 3.4 g/dL — ABNORMAL LOW (ref 3.5–5.0)
BILIRUBIN TOTAL: 0.5 mg/dL (ref 0.3–1.2)
TOTAL PROTEIN: 5.9 g/dL — AB (ref 6.5–8.1)

## 2017-08-25 LAB — TSH: TSH: 0.475 u[IU]/mL (ref 0.350–4.500)

## 2017-08-25 LAB — LIPASE, BLOOD: Lipase: 23 U/L (ref 11–51)

## 2017-08-25 LAB — I-STAT TROPONIN, ED: TROPONIN I, POC: 0 ng/mL (ref 0.00–0.08)

## 2017-08-25 LAB — BRAIN NATRIURETIC PEPTIDE: B Natriuretic Peptide: 51.6 pg/mL (ref 0.0–100.0)

## 2017-08-25 LAB — LACTATE DEHYDROGENASE: LDH: 197 U/L — ABNORMAL HIGH (ref 98–192)

## 2017-08-25 MED ORDER — AZITHROMYCIN 250 MG PO TABS: 250.0000 mg | ORAL_TABLET | Freq: Every day | ORAL | 0 refills | Status: DC

## 2017-08-25 MED ORDER — ONDANSETRON HCL 4 MG/2ML IJ SOLN
4.0000 mg | Freq: Once | INTRAMUSCULAR | Status: AC
  Administered 2017-08-25: 4 mg via INTRAVENOUS
  Filled 2017-08-25: qty 2

## 2017-08-25 MED ORDER — SODIUM CHLORIDE 0.9 % IV BOLUS (SEPSIS)
1000.0000 mL | Freq: Once | INTRAVENOUS | Status: AC
  Administered 2017-08-25: 1000 mL via INTRAVENOUS

## 2017-08-25 NOTE — ED Triage Notes (Signed)
Pt in from home via [facility] with c/o sharp central cp, radiating to back that woke her up this morning. Per EMS, pt had labored breathing on their arrival, sats were 96% on NRB, lungs diminished. Given 1 duoneb en route. Hx of asthma, able to speak in short sentences now. Sats 97% on 8L . Denies n/v or dizziness

## 2017-08-25 NOTE — ED Notes (Signed)
ED Provider at bedside. 

## 2017-08-25 NOTE — ED Triage Notes (Signed)
Patient states that she "woke up because I cannot breath". Pt states she also has chest pain as well with her "heart beating real fast":

## 2017-08-25 NOTE — ED Notes (Signed)
Patient transported to X-ray 

## 2017-08-25 NOTE — Discharge Planning (Signed)
Shannon [name] J. Clydene Laming, RN, BSN, [employer] [phone] Spoke with pt at bedside regarding discharge planning for [facility]. Offered pt list of home health agencies to choose from.  Pt chose [facility] to render services. Dian Situ of [facility] notified. Patient made aware that [facility] will be in contact in 24-48 hours.  No DME needs identified at this time.

## 2017-08-25 NOTE — ED Notes (Signed)
Main Lab called regarding pts pending labs. Stated they will run the labs now

## 2017-08-25 NOTE — ED Provider Notes (Signed)
[facility]ERGENC[facility] DEPARTMENT Provider Note   CSN: [id number] Arrival date & time: 08/25/17  4481     History   Chief Complaint Chief Complaint  Patient presents with  . Respiratory Distress    HPI Shannon [name] is a 62 y.o. female.  Patient is a 62 year old female with multiple medical problems including PAF on Xarelto, nonobstructive coronary artery disease from a cath in 2013, well-controlled HIV on 5 therapy medication with undetectable viral load, lipodystrophy, chronic neck and back pain, asthma, GERD, PE, nocturnal hypoxia, chronic shortness of breath presenting today with multiple complaints.  Patient seems to jump from one topic to the next so it is difficult to pin down exactly what brought her into the emergency room today.  Patient states that she has been vomiting without significant abdominal pain for multiple days now and is having trouble holding down food.  She states she feels very dehydrated.  She does state that in the last 2 weeks she has had intermittent chest pain that seems to be worse if she does too much.  It is difficult for her to describe the pain but it might be worse with deep breathing.  She has been using her nebulizers at home and denies productive cough or excessive wheezing.  Patient has not had a fever and denies any lower extremity swelling.  She states she has been taking her medications regularly but this chest pain is something new.  She denies having the chest pain currently but states over the last few weeks something seems to be changing in her body and she feels like her body is breaking down and she is not sure how to cope with it.  Sounds like at home she has had very low energy and low desire to do any tasks around her home.  She has been following up with her specialist and intensivist but states it does not seem like anything is changing.  Based on prior notes and office visits it sounds as patient is only intermittently  compliant with medication.   The history is provided by the patient.    Past Medical History:  Diagnosis Date  . Anemia   . Anxiety    HX PANIC ATTACKS  . Arthritis    "starting to; in my hands" (07/09/2015)  . Atrial flutter, paroxysmal ([place])   . Chronic asthma with acute exacerbation    "I have chronic asthma all the time; sometimes exacerbations" (07/09/2015)  . Chronic lower back pain   . Cyst of right kidney    "3 of them; dx'd in ~ 01/2015"  . GERD (gastroesophageal reflux disease)   . Heart murmur   . History of blood transfusion    "related to my brain surgery I think"  . History of pulmonary embolism 07/09/2015  . HIV antibody positive ([place])   . HIV disease ([place])   . Hyperlipidemia   . Hypertension   . Lipodystrophy   . Mild CAD 2013  . Pneumonia 07/09/2015  . Shingles   . Sleep apnea    "never completed part 2 of study; never wore mask" (07/09/2015)    Patient Active Problem List   Diagnosis Date Noted  . Acute bronchitis 07/13/2017  . Anticoagulated 05/26/2017  . Allergic rhinitis 05/14/2017  . Right flank pain 05/05/2017  . Atrial flutter ([place]) 03/11/2017  . CAD (coronary artery disease) 01/28/2017  . Morbid obesity with BMI of 40.0-44.9, adult ([place]) 01/13/2017  . On home oxygen therapy 01/13/2017  .  Ascending aorta dilatation ([facility]) 01/07/2017  . COPD (chronic obstructive pulmonary disease) ([place]) 12/20/2016  . Low back pain radiating to right lower extremity 12/02/2016  . Bilateral renal cysts 12/02/2016  . Chronic kidney disease (CKD), stage III (moderate) ([place]) 12/01/2016  . Generalized anxiety disorder 10/22/2016  . Hypersomnia 10/01/2016  . BPPV (benign paroxysmal positional vertigo) 09/18/2016  . Nocturnal hypoxemia 05/13/2016  . Cervical radiculopathy 04/14/2016  . GERD (gastroesophageal reflux disease) 01/02/2016  . Vitamin B12 deficiency 10/02/2015  . Inappropriate diet and eating habits 08/15/2015  . Opacity noted on imaging study   .  Dyspnea 07/09/2015  . Asthma, moderate persistent 07/09/2015  . HIV disease ([place]) 07/09/2015  . Essential hypertension 07/09/2015  . Mild diastolic dysfunction 62/83/1517    Past Surgical History:  Procedure Laterality Date  . ABDOMINAL HYSTERECTOMY     "robotic laparosopic"  . BRAIN SURGERY  1974   "brain tumor; benign; on top of my brain; got a plate in there"  . CARDIAC CATHETERIZATION    . TONSILLECTOMY AND ADENOIDECTOMY      OB History    No data available       Home Medications    Prior to Admission medications   Medication Sig Start Date End Date Taking? Authorizing Provider  albuterol (PROAIR HFA) 108 (90 Base) MCG/ACT inhaler Inhale 2 puffs into the lungs every 4 (four) hours as needed for wheezing or shortness of breath. 05/05/17   [name], MD  albuterol (PROVENTIL) (2.5 MG/3ML) 0.083% nebulizer solution Take 3 mLs (2.5 mg total) by nebulization every 6 (six) hours as needed for wheezing or shortness of breath. 02/11/17   Lorella Nimrod, MD  carvedilol (COREG) 12.5 MG tablet Take 1 tablet (12.5 mg total) 2 (two) times daily with a meal by mouth. 07/01/17   Skeet Latch, MD  cyanocobalamin (,VITAMIN B-12,) 1000 MCG/ML injection Inject 1 mL (1,000 mcg total) into the muscle every 30 (thirty) days. 01/20/17   [name], MD  DESCOVY 200-25 MG tablet TAKE 1 TABLET BY MOUTH DAILY 07/26/17   [name], MD  diltiazem (CARDIZEM CD) 120 MG 24 hr capsule Take 1 capsule (120 mg total) by mouth daily. 07/13/17 10/11/17  Skeet Latch, MD  fluticasone (FLONASE) 50 MCG/ACT nasal spray Place 1 spray into both nostrils daily. 08/18/17   Little, Wenda Overland, MD  furosemide (LASIX) 20 MG tablet Take 1 tablet (20 mg total) daily by mouth. 07/01/17 09/29/17  Skeet Latch, MD  loratadine (CLARITIN) 10 MG tablet Take 1 tablet (10 mg total) by mouth daily as needed for allergies. 06/10/16 08/18/17  Alphonzo Grieve, MD  losartan (COZAAR) 100 MG tablet Take 1 tablet (100  mg total) by mouth at bedtime. hypertension 05/26/17   Erlene Quan, PA-C  Multiple Vitamin (MULTIVITAMIN WITH MINERALS) TABS tablet Take 1 tablet by mouth daily. 08/29/15   Collier [place], MD  NIFEdipine (ADALAT CC) 90 MG 24 hr tablet Take 1 tablet (90 mg total) by mouth daily. 02/10/17   Skeet Latch, MD  OXYGEN Inhale 2.5-3 L into the lungs at bedtime as needed (Shortness of Breath). 2.5 to 3 liters at night and as needed in the daytime.     [provider]  potassium chloride (K-DUR) 10 MEQ tablet Take 1 tablet (10 mEq total) daily by mouth. 07/01/17 09/29/17  Skeet Latch, MD  rivaroxaban (XARELTO) 20 MG TABS tablet Take 1 tablet (20 mg total) by mouth daily with supper. 05/05/17   [name], MD  sodium chloride (OCEAN) 0.65 % SOLN nasal spray Place 1 spray into both nostrils as needed for congestion. 03/26/17   Lorella Nimrod, MD  Tetrahydrozoline HCl (VISINE OP) Place 2 drops into both eyes daily as needed (for dry eyes).     [provider]  TIVICAY 50 MG tablet TAKE 1 TABLET BY MOUTH DAILY 07/26/17   [name], MD    Family History Family History  Problem Relation Age of Onset  . Asthma Mother   . Heart failure Mother        cardiomyopathy  . Heart murmur Sister   . Heart murmur Brother   . Diabetes Sister   . Thyroid disease Sister   . Heart murmur Sister     Social History Social History   Tobacco Use  . Smoking status: Never Smoker  . Smokeless tobacco: Never Used  Substance Use Topics  . Alcohol use: Yes    Alcohol/week: 0.0 oz    Comment: Rarely.  . Drug use: No     Allergies   Tree extract and Ciprofloxacin   Review of Systems Review of Systems  Musculoskeletal:       Patient states she has had a wound on her left shin for the last few months that refuses to heal.  She denies any new redness or drainage but there is a small ulcer that just will not go away.  She also has some swelling in her left foot but states it is  improved from prior.  Neurological:       Patient states she gets headaches frequently but in the last 2 weeks has noticed some numbness on the side of her face and arm which she attributes to her neck problems.  She does state a few months ago she fell when she sat on a broken chair and hit the back of her head and was never evaluated for that.  She denies any unilateral numbness or weakness in the legs.  She denies any weakness in the upper extremities but states the numbness does go into her left arm occasionally.  Psychiatric/Behavioral:       Patient complains of not being able to sleep well.  She also seems sad and has minimal social support  All other systems reviewed and are negative.    Physical Exam Updated Vital Signs BP (!) 150/98 (BP Location: Right Arm)   Pulse 71   Temp 98 F (36.7 C) (Oral)   Resp 18   Ht 5\' 3"  (1.6 m)   Wt 97.5 kg (215 lb)   SpO2 96%   BMI 38.09 kg/m   Physical Exam  Constitutional: She is oriented to person, place, and time. She appears well-developed and well-nourished. No distress.  HENT:  Head: Normocephalic and atraumatic.  Mouth/Throat: Oropharynx is clear and moist. Mucous membranes are dry.  Eyes: Conjunctivae and EOM are normal. Pupils are equal, round, and reactive to light.  Neck: Normal range of motion. Neck supple.  Cardiovascular: Normal rate, regular rhythm and intact distal pulses.  Murmur heard.  Systolic murmur is present with a grade of 2/6. Murmur heard best at LSB  Pulmonary/Chest: Effort normal. No respiratory distress. She has decreased breath sounds. She has no wheezes. She has no rales.  Abdominal: Soft. She exhibits no distension. There is no tenderness. There is no rebound and no guarding.  obese  Musculoskeletal: Normal range of motion. She exhibits no edema or tenderness.  Buffalo hump in fact accumulation over the posterior shoulder areas.  Small nonhealing ulcer present on the left shin without drainage or erythema.   1+ pitting edema in the left foot.  Capillary refill in bilateral feet is 2 seconds.  Neurological: She is alert and oriented to person, place, and time.  Skin: Skin is warm and dry. No rash noted. No erythema.  Psychiatric: She has a normal mood and affect. Her behavior is normal.  Nursing note and vitals reviewed.    ED Treatments / Results  Labs (all labs ordered are listed, but only abnormal results are displayed) Labs Reviewed  CBC WITH DIFFERENTIAL/PLATELET - Abnormal; Notable for the following components:      Result Value   Hemoglobin 11.8 (*)    All other components within normal limits  BASIC METABOLIC PANEL - Abnormal; Notable for the following components:   Chloride 97 (*)    CO2 34 (*)    Glucose, Bld 109 (*)    Creatinine, Ser 1.20 (*)    GFR calc non Af Amer 47 (*)    GFR calc Af Amer 55 (*)    All other components within normal limits  LACTATE DEHYDROGENASE - Abnormal; Notable for the following components:   LDH 197 (*)    All other components within normal limits  URINALYSIS, ROUTINE W REFLEX MICROSCOPIC - Abnormal; Notable for the following components:   Color, Urine STRAW (*)    All other components within normal limits  HEPATIC FUNCTION PANEL - Abnormal; Notable for the following components:   Total Protein 5.9 (*)    Albumin 3.4 (*)    Bilirubin, Direct <0.1 (*)    All other components within normal limits  TSH  BRAIN NATRIURETIC PEPTIDE  LIPASE, BLOOD  T4  I-STAT TROPONIN, ED  I-STAT TROPONIN, ED    EKG  EKG Interpretation  Date/Time:  Wednesday August 25 2017 06:52:25 EST Ventricular Rate:  74 PR Interval:  172 QRS Duration: 94 QT Interval:  396 QTC Calculation: 439 R Axis:   -32 Text Interpretation:  Normal sinus rhythm Left axis deviation Minimal voltage criteria for LVH, may be normal variant Abnormal ECG No significant change since last tracing Confirmed by Pryor Curia [phone]) on 08/25/2017 7:01:36 AM       Radiology Dg Chest 2  View  Result Date: 08/25/2017 CLINICAL DATA:  Hervey Ard, central chest pain radiating to the back. EXAM: CHEST  2 VIEW COMPARISON:  Chest x-ray dated August 18, 2017. FINDINGS: The patient is slightly rotated to the right. Stable borderline cardiomegaly. Normal pulmonary vascularity. Unchanged scarring in the left mid lung. No focal consolidation, pleural effusion, or pneumothorax. No acute osseous abnormality. IMPRESSION: No active cardiopulmonary disease. Electronically Signed   By: Titus Dubin M.D.   On: 08/25/2017 08:11   Ct Head Wo Contrast  Result Date: 08/25/2017 CLINICAL DATA:  Dizziness, left facial numbness x weeks. Remote history of brain surgery. EXAM: CT HEAD WITHOUT CONTRAST TECHNIQUE: Contiguous axial images were obtained from the base of the skull through the vertex without intravenous contrast. COMPARISON:  MRI brain dated 05/07/2016. FINDINGS: Brain: No evidence of acute infarction, hemorrhage, hydrocephalus, extra-axial collection or mass lesion/mass effect. Mild subcortical white matter and periventricular small vessel ischemic changes. Vascular: No hyperdense vessel or unexpected calcification. Skull: Old right parietal craniotomy.  Otherwise negative. Sinuses/Orbits: The visualized paranasal sinuses are essentially clear. The mastoid air cells are unopacified. Other: None. IMPRESSION: No evidence of acute intracranial abnormality. Old right parietal craniotomy. Electronically Signed   By: Julian Hy M.D.   On: 08/25/2017  08:46    Procedures Procedures (including critical care time)  Medications Ordered in ED Medications  sodium chloride 0.9 % bolus 1,000 mL (1,000 mLs Intravenous New Bag/Given 08/25/17 0816)  ondansetron (ZOFRAN) injection 4 mg (4 mg Intravenous Given 08/25/17 0814)     Initial Impression / Assessment and Plan / ED Course  I have reviewed the triage vital signs and the nursing notes.  Pertinent labs & imaging results that were available during my care  of the patient were reviewed by me and considered in my medical decision making (see chart for details).     Patient presenting today with multiple complaints and a history of multiple chronic medical conditions.  Patient seems to have come today for worsening chronic short and new chest pain that has been intermittent over the last 2 weeks.  However the patient also seems to have a component of sleep disturbance and possibly depression as based on some office notes she is not taking her antidepressant regularly.  On exam today patient is not wheezing or tachypnea concerning for asthma exacerbation.  However given patient's history of long-standing HIV concern for possible cardiac disease, PCP pneumonia, potential thyroid disorder.  Patient is also complaining of vague numbness in the left side of her face with a history of a fall a few months ago and she is on anticoagulation.  Low suspicion for stroke but concern for possible underlying bleed.  Patient also is complaining of vomiting which does not seem to be a new complaint based on prior emergency room and office visits.  Patient does not appear to have CHF today and was given a bolus of fluid and Zofran.  Patient's EKG today is unchanged.  Initial troponin is within normal limits.  Patient did have a catheterization 6 years ago which showed nonobstructive disease.  She had a negative Myoview in 2017 and had an echo 6 months ago which showed normal EF of 60-65% with aortic stenosis that was not felt to be significant.  However patient may benefit from stress testing.  Patient was recently seen in the emergency room last week and had a negative d-dimer at that time and low suspicion the patient's symptoms are from a PE today. CBC, CMP, lipase, TSH, LDH, chest x-ray, head CT pending  1:51 PM Labs are reassuring.  This was all discussed with the patient.  Do not feel that her atypical chest pain today is ACS.  Could be GI in nature or related to her chronic  asthma.  Discussed with her follow-up with her specialist which she is happy to do.  Also spoke with case management who will work to get her some in-home therapy which the patient is very grateful for.  On reevaluation patient states she has had significant sinus pressure, drainage, headache and is concerned she has a sinus infection.  She was given azithromycin.  Final Clinical Impressions(s) / ED Diagnoses   Final diagnoses:  Shortness of breath  Atypical chest pain  Acute recurrent sinusitis, unspecified location    ED Discharge Orders        Ordered    azithromycin (ZITHROMAX) 250 MG tablet  Daily     08/25/17 1328       Blanchie Dessert, MD 08/25/17 1352

## 2017-08-25 NOTE — ED Provider Notes (Signed)
MSE was initiated and I personally evaluated the patient and placed orders (if any) at  7:01 AM on August 25, 2017.  The patient appears stable so that the remainder of the MSE may be completed by another provider.    Tajon Moring, Delice Bison, DO 08/25/17 [phone]

## 2017-08-26 ENCOUNTER — Encounter: Payer: Self-pay | Admitting: Pulmonary Disease

## 2017-08-28 LAB — T4: T4 TOTAL: 6.2 ug/dL (ref 4.5–12.0)

## 2017-08-30 ENCOUNTER — Other Ambulatory Visit: Payer: Medicare Other

## 2017-08-31 ENCOUNTER — Ambulatory Visit: Payer: Medicare Other | Admitting: Pulmonary Disease

## 2017-09-01 ENCOUNTER — Ambulatory Visit
Admission: RE | Admit: 2017-09-01 | Discharge: 2017-09-01 | Disposition: A | Discharge status: Home / Self Care | Payer: Medicare Other | Source: Ambulatory Visit | Attending: Neurological Surgery | Admitting: Neurological Surgery

## 2017-09-01 DIAGNOSIS — M4727 Other spondylosis with radiculopathy, lumbosacral region: Secondary | ICD-10-CM

## 2017-09-01 DIAGNOSIS — M48061 Spinal stenosis, lumbar region without neurogenic claudication: Secondary | ICD-10-CM | POA: Diagnosis not present

## 2017-09-02 ENCOUNTER — Telehealth: Payer: Self-pay

## 2017-09-02 ENCOUNTER — Encounter: Payer: Self-pay | Admitting: Internal Medicine

## 2017-09-02 ENCOUNTER — Ambulatory Visit (INDEPENDENT_AMBULATORY_CARE_PROVIDER_SITE_OTHER): Payer: Medicare Other | Admitting: Internal Medicine

## 2017-09-02 VITALS — BP 139/84 | HR 83 | Temp 97.7°F | Ht 63.0 in | Wt 221.0 lb

## 2017-09-02 DIAGNOSIS — R634 Abnormal weight loss: Secondary | ICD-10-CM

## 2017-09-02 DIAGNOSIS — B20 Human immunodeficiency virus [HIV] disease: Secondary | ICD-10-CM | POA: Diagnosis not present

## 2017-09-02 DIAGNOSIS — M7918 Myalgia, other site: Secondary | ICD-10-CM | POA: Diagnosis not present

## 2017-09-02 LAB — COMPLETE METABOLIC PANEL WITH GFR
AG Ratio: 1.6 (calc) (ref 1.0–2.5)
ALT: 15 U/L (ref 6–29)
AST: 15 U/L (ref 10–35)
Albumin: 3.9 g/dL (ref 3.6–5.1)
Alkaline phosphatase (APISO): 61 U/L (ref 33–130)
BUN/Creatinine Ratio: 18 (calc) (ref 6–22)
BUN: 22 mg/dL (ref 7–25)
CHLORIDE: 102 mmol/L (ref 98–110)
CO2: 35 mmol/L — AB (ref 20–32)
CREATININE: 1.19 mg/dL — AB (ref 0.50–0.99)
Calcium: 9.6 mg/dL (ref 8.6–10.4)
GFR, EST AFRICAN AMERICAN: 56 mL/min/{1.73_m2} — AB (ref >=60)
GFR, EST NON AFRICAN AMERICAN: 49 mL/min/{1.73_m2} — AB (ref >=60)
GLUCOSE: 114 mg/dL — AB (ref 65–99)
Globulin: 2.4 g/dL (calc) (ref 1.9–3.7)
Potassium: 4.2 mmol/L (ref 3.5–5.3)
Sodium: 141 mmol/L (ref 135–146)
TOTAL PROTEIN: 6.3 g/dL (ref 6.1–8.1)
Total Bilirubin: 0.5 mg/dL (ref 0.2–1.2)

## 2017-09-02 LAB — CBC WITH DIFFERENTIAL/PLATELET
BASOS PCT: 0.2 %
Basophils Absolute: 10 cells/uL (ref 0–200)
Eosinophils Absolute: 78 cells/uL (ref 15–500)
Eosinophils Relative: 1.5 %
HCT: 37.4 % (ref 35.0–45.0)
Hemoglobin: 12.1 g/dL (ref 11.7–15.5)
Lymphs Abs: 1388 cells/uL (ref 850–3900)
MCH: 29.6 pg (ref 27.0–33.0)
MCHC: 32.4 g/dL (ref 32.0–36.0)
MCV: 91.4 fL (ref 80.0–100.0)
MONOS PCT: 8.8 %
MPV: 10.4 fL (ref 7.5–12.5)
NEUTROS ABS: 3266 {cells}/uL (ref 1500–7800)
Neutrophils Relative %: 62.8 %
PLATELETS: 182 10*3/uL (ref 140–400)
RBC: 4.09 10*6/uL (ref 3.80–5.10)
RDW: 12.4 % (ref 11.0–15.0)
TOTAL LYMPHOCYTE: 26.7 %
WBC mixed population: 458 cells/uL (ref 200–950)
WBC: 5.2 10*3/uL (ref 3.8–10.8)

## 2017-09-02 MED ORDER — BICTEGRAVIR-EMTRICITAB-TENOFOV 50-200-25 MG PO TABS: 1.0000 | ORAL_TABLET | Freq: Every day | ORAL | 11 refills | Status: DC

## 2017-09-02 NOTE — Telephone Encounter (Signed)
   [facility] Pre-operative Risk Assessment    Request for surgical clearance:  1. What type of surgery is being performed? Right wrist dorsal carpal ganglion excision Excision tumor soft tissue forearm wrist deep,Excision ganglion wrist primary-dorsal or volar.  2. When is this surgery scheduled? Unknown  3. Are there any medications that need to be held prior to surgery and how long? Xarelto  4. Practice name and name of physician performing surgery? [facility]  5. What is your office phone and fax number? Phone # [phone]   Fax # [phone]  6. Anesthesia type (None, local, MAC, general) ? Karie Soda 09/02/2017, 9:29 AM  _________________________________________________________________   (provider comments below)

## 2017-09-02 NOTE — Progress Notes (Signed)
Patient ID: Shannon [name], female   DOB: [birth year], 62 y.o.   MRN: [id number]  HPI CD 4 count of 620/VL<20 on tivicay/descovy. She reports multiple health problems at this time. She has had low back pain and intermittent difficulty ambulating. Had mri of lumbar spine yesterday that was ordered by DR Ditty who she is going to schedule follow up appt.  Also went to ED for sob on 1/2, given zpack for upper respiratroy infection  She mostly complains of her central obesity, feels that it interferes with breathing, ambulation.  occ dizzy from her HTN meds, has follow up with cardiology this month  Constant nausea  Also interested in removal of ganglion cyst of right hand but before ortmann does any surgery, he would like her optimized from pulmonary and cardiac standpoint  Outpatient Encounter Medications as of 09/02/2017  Medication Sig  . albuterol (PROAIR HFA) 108 (90 Base) MCG/ACT inhaler Inhale 2 puffs into the lungs every 4 (four) hours as needed for wheezing or shortness of breath.  [name] albuterol (PROVENTIL) (2.5 MG/3ML) 0.083% nebulizer solution Take 3 mLs (2.5 mg total) by nebulization every 6 (six) hours as needed for wheezing or shortness of breath.  . Ascorbic Acid (VITAMIN C) 1000 MG tablet Take 1,000 mg by mouth daily.  [name] BIOTIN PO Take 1 tablet by mouth daily.  . carvedilol (COREG) 12.5 MG tablet Take 1 tablet (12.5 mg total) 2 (two) times daily with a meal by mouth.  . cholecalciferol (VITAMIN D) 1000 units tablet Take 1,000 Units by mouth daily.  . cyanocobalamin (,VITAMIN B-12,) 1000 MCG/ML injection Inject 1 mL (1,000 mcg total) into the muscle every 30 (thirty) days.  . DESCOVY 200-25 MG tablet TAKE 1 TABLET BY MOUTH DAILY  . diltiazem (CARDIZEM CD) 120 MG 24 hr capsule Take 1 capsule (120 mg total) by mouth daily.  . fluticasone (FLONASE) 50 MCG/ACT nasal spray Place 1 spray into both nostrils daily. (Patient taking differently: Place 1 spray into both nostrils daily  as needed for allergies or rhinitis. )  . furosemide (LASIX) 20 MG tablet Take 1 tablet (20 mg total) daily by mouth. (Patient taking differently: Take 20 mg by mouth daily. Patient take differently (when she needs to).)  . losartan (COZAAR) 100 MG tablet Take 1 tablet (100 mg total) by mouth at bedtime. hypertension  . Multiple Vitamin (MULTIVITAMIN WITH MINERALS) TABS tablet Take 1 tablet by mouth daily.  [name] NIFEdipine (ADALAT CC) 90 MG 24 hr tablet Take 1 tablet (90 mg total) by mouth daily.  . OXYGEN Inhale 2.5-3 L into the lungs at bedtime as needed (Shortness of Breath). 2.5 to 3 liters at night and as needed in the daytime.   . potassium chloride (K-DUR) 10 MEQ tablet Take 1 tablet (10 mEq total) daily by mouth. (Patient taking differently: Take 10 mEq by mouth every other day. )  . sodium chloride (OCEAN) 0.65 % SOLN nasal spray Place 1 spray into both nostrils as needed for congestion.  . Tetrahydrozoline HCl (VISINE OP) Place 2 drops into both eyes daily as needed (for dry eyes).   [name] TIVICAY 50 MG tablet TAKE 1 TABLET BY MOUTH DAILY  . azithromycin (ZITHROMAX) 250 MG tablet Take 1 tablet (250 mg total) by mouth daily. Take first 2 tablets together, then 1 every day until finished. (Patient not taking: Reported on 09/02/2017)  . loratadine (CLARITIN) 10 MG tablet Take 1 tablet (10 mg total) by mouth daily as needed for allergies.  [name]  rivaroxaban (XARELTO) 20 MG TABS tablet Take 1 tablet (20 mg total) by mouth daily with supper. (Patient not taking: Reported on 09/02/2017)   No facility-administered encounter medications on file as of 09/02/2017.      Patient Active Problem List   Diagnosis Date Noted  . Acute bronchitis 07/13/2017  . Anticoagulated 05/26/2017  . Allergic rhinitis 05/14/2017  . Right flank pain 05/05/2017  . Atrial flutter ([place]) 03/11/2017  . CAD (coronary artery disease) 01/28/2017  . Morbid obesity with BMI of 40.0-44.9, adult ([place]) 01/13/2017  . On home oxygen therapy  01/13/2017  . Ascending aorta dilatation ([facility]) 01/07/2017  . COPD (chronic obstructive pulmonary disease) ([place]) 12/20/2016  . Low back pain radiating to right lower extremity 12/02/2016  . Bilateral renal cysts 12/02/2016  . Chronic kidney disease (CKD), stage III (moderate) ([place]) 12/01/2016  . Generalized anxiety disorder 10/22/2016  . Hypersomnia 10/01/2016  . BPPV (benign paroxysmal positional vertigo) 09/18/2016  . Nocturnal hypoxemia 05/13/2016  . Cervical radiculopathy 04/14/2016  . GERD (gastroesophageal reflux disease) 01/02/2016  . Vitamin B12 deficiency 10/02/2015  . Inappropriate diet and eating habits 08/15/2015  . Opacity noted on imaging study   . Dyspnea 07/09/2015  . Asthma, moderate persistent 07/09/2015  . HIV disease ([place]) 07/09/2015  . Essential hypertension 07/09/2015  . Mild diastolic dysfunction 84/69/6295     Health Maintenance Due  Topic Date Due  . TETANUS/TDAP  03/03/1973  . COLONOSCOPY  03/03/2004  . INFLUENZA VACCINE  03/24/2017    Soc hx: non smoker, non drinker Review of Systems Per hpi Physical Exam   BP 139/84   Pulse 83   Temp 97.7 F (36.5 C) (Oral)   Ht 5\' 3"  (1.6 m)   Wt 221 lb (100.2 kg)   BMI 39.15 kg/m   Physical Exam  Constitutional:  oriented to person, place, and time. appears well-developed and well-nourished. No distress.  HENT: [place]/AT, PERRLA, no scleral icterus Mouth/Throat: Oropharynx is clear and moist. No oropharyngeal exudate.  Cardiovascular: Normal rate, regular rhythm and normal heart sounds. Exam reveals no gallop and no friction rub.  No murmur heard.  Pulmonary/Chest: Effort normal and breath sounds normal. No respiratory distress.  has no wheezes.  Neck = supple, no nuchal rigidity Abdominal: Soft. Bowel sounds are normal.  exhibits no distension. There is no tenderness.  Lymphadenopathy: no cervical adenopathy. No axillary adenopathy Neurological: alert and oriented to person, place, and time.  Skin: Skin  is warm and dry. No rash noted. No erythema.  Psychiatric: a normal mood and affect.  behavior is normal.   Lab Results  Component Value Date   CD4TCELL 39 05/12/2017   Lab Results  Component Value Date   CD4TABS 620 05/12/2017   CD4TABS 730 11/26/2016   CD4TABS 640 08/12/2016   Lab Results  Component Value Date   HIV1RNAQUANT <20 NOT DETECTED 05/12/2017   No results found for: HEPBSAB Lab Results  Component Value Date   LABRPR Non Reactive 11/30/2016    CBC Lab Results  Component Value Date   WBC 6.8 08/25/2017   RBC 3.91 08/25/2017   HGB 11.8 (L) 08/25/2017   HCT 37.6 08/25/2017   PLT 170 08/25/2017   MCV 96.2 08/25/2017   MCH 30.2 08/25/2017   MCHC 31.4 08/25/2017   RDW 13.3 08/25/2017   LYMPHSABS 1.7 08/25/2017   MONOABS 0.6 08/25/2017   EOSABS 0.1 08/25/2017    BMET Lab Results  Component Value Date   NA 137 08/25/2017   K 4.0  08/25/2017   CL 97 (L) 08/25/2017   CO2 34 (H) 08/25/2017   GLUCOSE 109 (H) 08/25/2017   BUN 13 08/25/2017   CREATININE 1.20 (H) 08/25/2017   CALCIUM 9.5 08/25/2017   GFRNONAA 47 (L) 08/25/2017   GFRAA 55 (L) 08/25/2017      Assessment and Plan  hiv = will check labs, has excellent adherence. Anticipate she will continue to be undetectable VL  - has element of central obesity vs lipodystrophy from longstanding hx of hiv/hiv meds  - weight loss management =  Will refer her to cara beasley at [facility] for health management. Not sure if this is all due to lipodystrophy. She has family hx of obesity, sister had gastric bypass. Recommend to get her set up up with dr Leafy Ro and attending introduction meeting  Pill burden = offered to switch to biktarvy which she is contemplating once some of her health concerns are resolved  Sob = she has finished abtx. Her exam appears benign ,see dr Elsworth Soho in the coming months if it worsens

## 2017-09-02 NOTE — Patient Instructions (Signed)
Call us after taking biktarvy for 7 days to see how you are tolerating your new medication   We are referring you to the weight loss clinic through [facility],  With dr Rogue Jury, [phone]. You will need to attend an information session first.

## 2017-09-03 LAB — T-HELPER CELL (CD4) - (RCID CLINIC ONLY)
CD4 % Helper T Cell: 34 % (ref 33–55)
CD4 T Cell Abs: 520 /uL (ref 400–2700)

## 2017-09-03 NOTE — Telephone Encounter (Signed)
Patient with diagnosis of Aflutter with history of PE on Xarelto for anticoagulation.    Procedure: excision soft tissue tumor Date of procedure: pending  CHADS2-VASc score of 3 (CHF, HTN, AGE, DM2, stroke/tia x 2, CAD, AGE, female)  CrCl 65ml/min  Per office protocol, patient can hold Xarelto for 24 hours prior to procedure.

## 2017-09-03 NOTE — Telephone Encounter (Addendum)
  Pre-operative Risk Assessment - Provider Statement    Patient ID:  Shannon [name], DOB: 1955/10/09, MRN: [id number]   Shannon [name]'s chart has been reviewed for pre-operative risk assessment.  She was last seen by Dr. Skeet Latch in Nov 2018.  However, she was recently seen in the ED with complaints of shortness of breath and chest pain.    Because of this patient's past medical history and time since last visit, a follow-up visit is required in order to better asses peri-operative cardiovascular risk. Pre-Op Covering Staff:  (1) Please schedule an appointment and notify patient.   (2) Please contact surgeon or referring provider via preferred method (phone, fax, etc) to notify them of the need for an office visit prior to clearance.   Will also forward to anticoagulation clinic to get recommendations regarding holding Xarelto.    Signed,  Richardson Dopp, PA-C  09/03/2017 9:28 AM

## 2017-09-06 LAB — HIV-1 RNA QUANT-NO REFLEX-BLD
HIV 1 RNA Quant: <20 copies/mL
HIV-1 RNA Quant, Log: <1.3 Log copies/mL

## 2017-09-06 NOTE — Progress Notes (Signed)
Cardiology Office Note   Date:  09/09/2017   ID:  RAYETTA VEITH, DOB 1955/09/02, MRN [id number]  PCP:  [name], MD  Cardiologist:   Skeet Latch, MD  ID: [name], MD  No chief complaint on file.    History of Present Illness: Shannon [name] is a 62 y.o. female with hypertension, asthma, non-obstructive CAD, OSA, prior PE, moderate pulmonary hypertension, mild ascending aorta aneurysm, and HIV (well-controlled)  who presents for follow up.  Ms. Lapinski was initially referred by her infectious disease doctor, [name], MD, on 12/26/15. [name] was concerned about her lower extremity edema and shortness of breath.  She was started on lasix 20 mg every other day.  Ms. Leccese noted shortness of breath since her hospitalization for pneumonia 06/2015.   She denied lower extremity edema or orthopnea but did report chest pain when laying down at night.  She was referred for an echo 01/2016 that revealed LVEF 60-65% awith grade 1 diastolic dysfunction and hypokinesis of the basal inferior wall.  She also had very mild aortic stenosis with a mean gradient of 9 mmHg and trivial AR.  PASP was 52 mmHg.  She had a Lexiscan Myoview at that time that was negative for ischemia.  Ms. Schram previously had a Lexiscan Cardiolite in September 2013 that revealed a medium, mild reversible defect in the anterior wall and normal systolic function. She subsequently underwent cardiac catheterization that revealed nonobstructive coronary disease. She had mild pulmonary hypertension and a normal cardiac output. Left ventriculography revealed mild to moderate aortic regurgitation. She had an echo May 2016 that revealed an ejection fraction of 54% and mild diastolic dysfunction. It also revealed calcification of the aortic valve, mild to moderate aortic regurgitation, and mild aortic stenosis. Peak velocity was 2.1 m/s. She had a 24-hour Holter in February 2015 that revealed rare PACs and PVCs.  Echo 01/06/17  revealed LVEF 60-65% with mild LVH and a mean aortic valve gradient of 8 mmHg.  Tricuspid regurgitation was not significant enough to assess pulmonary pressures  Ms. Asby was admitted to the hospital 01/25/17 with hypertensive emergency and chest pain. Her blood pressure was 215/100.  Her blood pressure in the ED was 185/119. She was again seen in the ED 01/28/17 with shortness of breath and wheezing. Her blood pressure at that time was 190/120. It was noted that she hasn't been taking her medication as prescribed.  HCTZ was discontinued.     Ms. Vavra has been doing much better with her blood pressure.  She has stopped taking losartan because her blood pressure gets too low and she feels poorly.  She notes that her blood pressure is elevated when she is in pain.  She has a lot of pain from her neck and back.  She was also in the ED 08/25/17 for a URI and on 08/18/17 for R sided chest and neck pain.  Her BP was 172/97 at the time. She continues to also complain of her ganglion cyst, poor healing from an anterior tibia wound, lipodystrophy and skepticism about whether she should change her antiretrovirals as directed by her infectious disease team.  Due to the poor healing of her leg wound she decided to stop taking her anticoagulation.  She also thinks that this is causing her to feel cold.  She struggles with nausea and is not eating well.  She sees her gastroenterologist next week.  She has numbness in the left side of her face and  neck and is following up with urology for this.  She has been very unstable on her feet which she attributes to central adiposity.  She wonders if she could be a candidate for gastric bypass.  She has been referred to see Dr. Leafy Ro.   Past Medical History:  Diagnosis Date  . Anemia   . Anxiety    HX PANIC ATTACKS  . Arthritis    "starting to; in my hands" (07/09/2015)  . Atrial fibrillation ([place])   . Atrial flutter, paroxysmal ([place])   . Chronic asthma with acute exacerbation     "I have chronic asthma all the time; sometimes exacerbations" (07/09/2015)  . Chronic lower back pain   . Cyst of right kidney    "3 of them; dx'd in ~ 01/2015"  . GERD (gastroesophageal reflux disease)   . Heart murmur   . History of blood transfusion    "related to my brain surgery I think"  . History of pulmonary embolism 07/09/2015  . HIV antibody positive ([place])   . HIV disease ([place])   . Hyperlipidemia   . Hypertension   . Lipodystrophy   . Mild CAD 2013  . Pneumonia 07/09/2015  . Shingles   . Sleep apnea    "never completed part 2 of study; never wore mask" (07/09/2015)    Past Surgical History:  Procedure Laterality Date  . ABDOMINAL HYSTERECTOMY     "robotic laparosopic"  . BRAIN SURGERY  1974   "brain tumor; benign; on top of my brain; got a plate in there"  . CARDIAC CATHETERIZATION    . TONSILLECTOMY AND ADENOIDECTOMY       Current Outpatient Medications  Medication Sig Dispense Refill  . albuterol (PROAIR HFA) 108 (90 Base) MCG/ACT inhaler Inhale 2 puffs into the lungs every 4 (four) hours as needed for wheezing or shortness of breath. 18 g 3  . albuterol (PROVENTIL) (2.5 MG/3ML) 0.083% nebulizer solution Take 3 mLs (2.5 mg total) by nebulization every 6 (six) hours as needed for wheezing or shortness of breath. 75 mL 3  . Ascorbic Acid (VITAMIN C) 1000 MG tablet Take 1,000 mg by mouth daily.    [name] BIOTIN PO Take 1 tablet by mouth daily.    . carvedilol (COREG) 12.5 MG tablet Take 1 tablet (12.5 mg total) 2 (two) times daily with a meal by mouth. 60 tablet 5  . cholecalciferol (VITAMIN D) 1000 units tablet Take 1,000 Units by mouth daily.    . cyanocobalamin (,VITAMIN B-12,) 1000 MCG/ML injection Inject 1 mL (1,000 mcg total) into the muscle every 30 (thirty) days. 1 mL 6  . diltiazem (CARDIZEM CD) 120 MG 24 hr capsule Take 1 capsule (120 mg total) by mouth daily. 90 capsule 1  . fluticasone (FLONASE) 50 MCG/ACT nasal spray Place 1 spray into both nostrils daily.  (Patient taking differently: Place 1 spray into both nostrils daily as needed for allergies or rhinitis. ) 9.9 g 0  . furosemide (LASIX) 20 MG tablet Take 1 tablet (20 mg total) daily by mouth. (Patient taking differently: Take 20 mg by mouth daily. Patient take differently (when she needs to).) 30 tablet 5  . losartan (COZAAR) 100 MG tablet Take 100 mg by mouth as needed (FOR BLOOD PRESSURE ABOVE 160).    . Multiple Vitamin (MULTIVITAMIN WITH MINERALS) TABS tablet Take 1 tablet by mouth daily. 90 tablet   . NIFEdipine (ADALAT CC) 90 MG 24 hr tablet Take 1 tablet (90 mg total) by mouth daily. [place]  tablet 5  . OXYGEN Inhale 2.5-3 L into the lungs at bedtime as needed (Shortness of Breath). 2.5 to 3 liters at night and as needed in the daytime.     . potassium chloride (K-DUR) 10 MEQ tablet Take 1 tablet (10 mEq total) daily by mouth. (Patient taking differently: Take 10 mEq by mouth every other day. ) 30 tablet 5  . rivaroxaban (XARELTO) 20 MG TABS tablet Take 1 tablet (20 mg total) by mouth daily with supper. 30 tablet 6  . sodium chloride (OCEAN) 0.65 % SOLN nasal spray Place 1 spray into both nostrils as needed for congestion. 30 mL 1  . Tetrahydrozoline HCl (VISINE OP) Place 2 drops into both eyes daily as needed (for dry eyes).     . bictegravir-emtricitabine-tenofovir AF (BIKTARVY) 50-200-25 MG TABS tablet Take 1 tablet by mouth daily. (Patient not taking: Reported on 09/07/2017) 30 tablet 11  . loratadine (CLARITIN) 10 MG tablet Take 1 tablet (10 mg total) by mouth daily as needed for allergies. 30 tablet 2   No current facility-administered medications for this visit.     Allergies:   Tree extract and Ciprofloxacin    Social History:  The patient  reports that  has never smoked. she has never used smokeless tobacco. She reports that she drinks alcohol. She reports that she does not use drugs.   Family History:  The patient's family history includes Asthma in her mother; Diabetes in her  sister; Heart failure in her mother; Heart murmur in her brother, sister, and sister; Thyroid disease in her sister.    ROS:  Please see the history of present illness.   Otherwise, review of systems are positive for none   All other systems are reviewed and negative.   PHYSICAL EXAM: VS:  BP 136/86   Pulse 88   Ht 5' 2.75" (1.594 m)   Wt 223 lb (101.2 kg)   SpO2 94%   BMI 39.82 kg/m  , BMI Body mass index is 39.82 kg/m. GENERAL:  Well appearing.  No acute distress HEENT: Pupils equal round and reactive, fundi not visualized, oral mucosa unremarkable NECK:  No JVD.  Waveform within normal limits, carotid upstroke brisk and symmetric, no bruits, no thyromegaly LUNGS:  Clear to auscultation bilaterally.  No crackles, rhonchi or wheezes.  HEART:  RRR.  PMI not displaced or sustained,S1 and S2 within normal limits, no S3, no S4, no clicks, no rubs, no murmurs ABD:  Flat, positive bowel sounds normal in frequency in pitch, no bruits, no rebound, no guarding, no midline pulsatile mass, no hepatomegaly, no splenomegaly.  RUQ TTP EXT:  2 plus pulses throughout, no edema. no cyanosis no clubbing SKIN:  No rashes no nodules NEURO:  Cranial nerves II through XII grossly intact, motor grossly intact throughout PSYCH:  Cognitively intact, oriented to person place and time.  Disorganized and tangential.   EKG:  EKG is not ordered today. The ekg ordered 12/31/15 shows sinus rhythm. Rate 89 bpm. 2 PVCs. 10/28/16: Sinus rhythm. Rate 93 bpm 07/01/17: Sinus tachycardia.  Rate 115 bpm.  LAD.    Coronary angiography 06/01/12: 10-20% OM, diffuse 20-30% LAD, 10-20% D1, 20% diffuse RCA RA 10, RV 39/12, RV EDP 12, capillary wedge pressure 19, PA 39/21, mean 27, LV 116/14, LVEDP 20, aorta 115/77 Cardiac output ([place]) 6.55, cardiac index 3.2 PVR 1.7 with units, SVR 12 with units. She P/QRS ratio 1 LVEF 80%. Aortic root mildly dilated. 1-2+ aortic regurgitation.  24-hour Holter 09/26/13: Sinus rhythm,  rare PACs,  rare PVCs  Echo 01/22/15: LVEF 70%. Mild LVH. Mild diastolic dysfunction. Normal RV function. Left atrium moderately enlarged. Right atrium mildly enlarged. Trace mitral regurgitation. Calcific aortic valve. Mild to moderate aortic regurgitation. Mild aortic stenosis. Peak velocity 2.1 m/s trace tricuspid regurgitation    Lexiscan Myoview 01/2016:   The left ventricular ejection fraction is normal (55-65%). The EF is 60% visually. The computer generated EF is not calculated correctly and is therefore not reported.  There was no ST segment deviation noted during stress.  The study is normal. no ischemia . no infarction   Echo 01/29/16: Study Conclusions  - Left ventricle: The cavity size was normal. Wall thickness was   increased in a pattern of moderate LVH. Systolic function was   normal. The estimated ejection fraction was in the range of 60%   to 65%. Basal inferior hypokinesis. Doppler parameters are   consistent with abnormal left ventricular relaxation (grade 1   diastolic dysfunction). - Aortic valve: Trileaflet; moderately calcified leaflets. There   was very mild stenosis. There was mild regurgitation. Mean   gradient (S): 9 mm Hg. Valve area (VTI): 1.68 cm^2. - Mitral valve: There was trivial regurgitation. - Left atrium: The atrium was mildly dilated. - Right ventricle: The cavity size was normal. Systolic function   was normal. - Tricuspid valve: Peak RV-RA gradient (S): 44 mm Hg. - Pulmonary arteries: PA peak pressure: 52 mm Hg (S). - Systemic veins: IVC measured 2.3 cm with > 50% respirophasic   variation, suggesting RA pressure 8 mmHg.  Impressions:  - Normal LV size with moderate LV hypertrophy. EF 60-65%. Basal   inferior hypokinesis. Normal RV size and systolic function. Very   mild aortic stenosis, mild aortic insufficieny. Moderate   pulmonary hypertension.   Recent Labs: 08/25/2017: B Natriuretic Peptide 51.6; TSH 0.475 09/02/2017: ALT 15; BUN 22; Creat  1.19; Hemoglobin 12.1; Platelets 182; Potassium 4.2; Sodium 141    Lipid Panel    Component Value Date/Time   CHOL 217 (H) 11/26/2016 1618   TRIG 88 11/26/2016 1618   HDL 67 11/26/2016 1618   CHOLHDL 3.2 11/26/2016 1618   VLDL 18 11/26/2016 1618   LDLCALC 132 (H) 11/26/2016 1618      Wt Readings from Last 3 Encounters:  09/07/17 223 lb (101.2 kg)  09/07/17 223 lb 2 oz (101.2 kg)  09/02/17 221 lb (100.2 kg)      ASSESSMENT AND PLAN:  # Atrial flutter: Converted with flecainide.  Continue diltiazem, carvedilol and Xarelto.   # Hypertension:  BP is much better.  Her blood pressure is controlled when she takes her medication and when she is not in pain. Continue nifedipine, diltiazem, and carvedilol.  She isn't taking losartan right now and her BP is good.  She will take this as needed.  # Aortic regurgitation # Aortic stenosis: Ms. Gruenhagen has very mild aortic stenosis (mean gradient 9 mmHg) and mild aortic regurgitation.  Continue to monitor.    # Hyperlipidemia: Repeat lipids and CMP.  She will work on diet and exercise.   # Ascending aorta aneurysm: 4.0 on CT 12/2016.  Will re-assess 12/2017.  Current medicines are reviewed at length with the patient today.  The patient does not have concerns regarding medicines.  The following changes have been made:  None   Labs/ tests ordered today include:   Orders Placed This Encounter  Procedures  . Lipid panel  . Comprehensive metabolic panel     Disposition:  FU with Raiquan Chandler C. Oval Linsey, MD, [facility] in 3 months.   Time spent: 30 minutes-Greater than 50% of this time was spent in counseling, explanation of diagnosis, planning of further management, and coordination of care.   Signed, Maijor Hornig C. Oval Linsey, MD, [facility]  09/09/2017 9:52 PM    [place]

## 2017-09-07 ENCOUNTER — Ambulatory Visit (INDEPENDENT_AMBULATORY_CARE_PROVIDER_SITE_OTHER): Payer: Medicare Other | Admitting: Cardiovascular Disease

## 2017-09-07 ENCOUNTER — Ambulatory Visit (INDEPENDENT_AMBULATORY_CARE_PROVIDER_SITE_OTHER): Payer: Medicare Other | Admitting: Internal Medicine

## 2017-09-07 ENCOUNTER — Encounter: Payer: Self-pay | Admitting: Cardiovascular Disease

## 2017-09-07 ENCOUNTER — Encounter: Payer: Self-pay | Admitting: *Deleted

## 2017-09-07 ENCOUNTER — Ambulatory Visit: Payer: Medicare Other | Admitting: Adult Health

## 2017-09-07 ENCOUNTER — Encounter: Payer: Self-pay | Admitting: Internal Medicine

## 2017-09-07 ENCOUNTER — Ambulatory Visit: Payer: Medicare Other | Admitting: Internal Medicine

## 2017-09-07 VITALS — BP 144/100 | HR 88 | Ht 62.75 in | Wt 223.1 lb

## 2017-09-07 VITALS — BP 136/86 | HR 88 | Ht 62.75 in | Wt 223.0 lb

## 2017-09-07 DIAGNOSIS — R110 Nausea: Secondary | ICD-10-CM | POA: Diagnosis not present

## 2017-09-07 DIAGNOSIS — I483 Typical atrial flutter: Secondary | ICD-10-CM

## 2017-09-07 DIAGNOSIS — R1031 Right lower quadrant pain: Secondary | ICD-10-CM

## 2017-09-07 DIAGNOSIS — F418 Other specified anxiety disorders: Secondary | ICD-10-CM

## 2017-09-07 DIAGNOSIS — I2510 Atherosclerotic heart disease of native coronary artery without angina pectoris: Secondary | ICD-10-CM

## 2017-09-07 DIAGNOSIS — R10816 Epigastric abdominal tenderness: Secondary | ICD-10-CM

## 2017-09-07 DIAGNOSIS — E881 Lipodystrophy, not elsewhere classified: Secondary | ICD-10-CM | POA: Diagnosis not present

## 2017-09-07 DIAGNOSIS — I10 Essential (primary) hypertension: Secondary | ICD-10-CM | POA: Diagnosis not present

## 2017-09-07 DIAGNOSIS — R1032 Left lower quadrant pain: Secondary | ICD-10-CM | POA: Diagnosis not present

## 2017-09-07 DIAGNOSIS — E7800 Pure hypercholesterolemia, unspecified: Secondary | ICD-10-CM

## 2017-09-07 NOTE — Patient Instructions (Signed)
Medication Instructions:  USE THE LOSARTAN AS NEEDED FOR BLOOD PRESSURE ABOVE 160   Labwork: FASTING LP/CMET PRIOR TO YOUR FOLLOW UP   Testing/Procedures: NONE  Follow-Up: Your physician recommends that you schedule a follow-up appointment in: 3 MONTH OV   Any Other Special Instructions Will Be Listed Below (If Applicable). LIMIT YOUR FIRED FOODS, FATTY FOOD, AND CHEESES   If you need a refill on your cardiac medications before your next appointment, please call your pharmacy.

## 2017-09-07 NOTE — Patient Instructions (Signed)
You have been scheduled for a CT scan of the abdomen and pelvis at [place] ([address]---this is in the same building as Press photographer).   You are scheduled on 09/13/17 at 2:00pm. You should arrive 15 minutes prior to your appointment time for registration. Please follow the written instructions below on the day of your exam:  WARNING: IF YOU ARE ALLERGIC TO IODINE/X-RAY DYE, PLEASE NOTIFY RADIOLOGY IMMEDIATELY AT [phone]! YOU WILL BE GIVEN A 13 HOUR PREMEDICATION PREP.  1) Do not eat or drink anything after 10:00AM (4 hours prior to your test) 2) You have been given 2 bottles of oral contrast to drink. The solution may taste               better if refrigerated, but do NOT add ice or any other liquid to this solution. Shake             well before drinking.    Drink 1 bottle of contrast @ 12:00PM (2 hours prior to your exam)  Drink 1 bottle of contrast @ 1:00PM (1 hour prior to your exam)  You may take any medications as prescribed with a small amount of water except for the following: Metformin, Glucophage, Glucovance, Avandamet, Riomet, Fortamet, Actoplus Met, Janumet, Glumetza or Metaglip. The above medications must be held the day of the exam AND 48 hours after the exam.  The purpose of you drinking the oral contrast is to aid in the visualization of your intestinal tract. The contrast solution may cause some diarrhea. Before your exam is started, you will be given a small amount of fluid to drink. Depending on your individual set of symptoms, you may also receive an intravenous injection of x-ray contrast/dye. Plan on being at [facility] for 30 minutes or longer, depending on the type of exam you are having performed.  This test typically takes 30-45 minutes to complete.  If you have any questions regarding your exam or if you need to reschedule, you may call the CT department at [phone] between the hours of 8:00 am and 5:00 pm,  Monday-Friday.  ________________________________________________________________________I appreciate the opportunity to care for you. Silvano Rusk, MD, [facility]

## 2017-09-07 NOTE — Progress Notes (Signed)
Shannon [name] 62 y.o. Dec 31, 1955 [id number]  Assessment & Plan:   Encounter Diagnoses  Name Primary?  . Epigastric abdominal tenderness without rebound tenderness Yes  . Bilateral lower abdominal pain   . Chronic nausea   . Anxiety about health     Other symptoms include early satiety.  I am going to evaluate her GI symptoms with a CT of the abdomen and pelvis with limited contrast IV.  She might still need an EGD.  Her weight actually has been variable but essentially stable for a long time now it seems.  At least months.  Anxiety is clearly a factor here but I do believe she does not feel well, and she is puzzled by the lack of resolution.  She has an FEV1 of 0.64 so does have bad lungs and is likely to feel short of breath and weak and tired etc.  I do not think she could get herself ready for a colonoscopy at this point based upon her overall symptom complex.  Perhaps we can get an EGD done depending upon what the CT shows.  Further plans pending that.  Also consider going back on a PPI though I doubt that would help her completely with respect to her GI problems it might be worth another try.  GIVEN her oxygen therapy and her comorbidities she would need a procedure at the hospital  She seems quite fixated on her lipodystrophy and issues.  If possible I wonder if she could see a therapist to try to get some counseling about her health conditions and help relieve her anxiety somewhat.  Blood pressure mildly elevated here follow-up with primary care She was not feeling well i.e. some pain etc. so I am not surprised that up a little bit  I appreciate the opportunity to care for this patient. CC: [name], MD Dr. [name]    Subjective:   Chief Complaint: Epigastric pain, nausea dyspnea  HPI The patient is here unaccompanied with complaints of persistent upper abdominal pain, I had seen her previously in May and recommended an EGD and a colonoscopy but for some  reason that did not get done.  At that time she was having early satiety epigastric pain, and some lower abdominal pain and some mild changes in bowel habits.  She says she has been sick a lot in between, she is complaining about a multitude of problems including lower back pain for which she saw a neurosurgeon and was given a back brace, MRI of the lumbosacral spine did not show anything surgical, she has chronic nausea, she feels like she is terribly weak and complains persistently and again as in the past about her centripetal obesity, and her lipodystrophy.  She feels short of breath all the time and weak.  She is frustrated because she feels so bad and there are no clear answers at this point.  She recently saw [name] of infectious disease and things are stable with respect to her HIV.  Medication is working well.  She says she is just too big for her legs to carrier her.  She feels like she is losing muscle mass in her extremities.  She continues on oxygen at night.  Note she was prescribed omeprazole when I saw her in the spring last year but I do not think that made any difference.  She is not on it now. Allergies  Allergen Reactions  . Tree Extract Swelling and Other (See Comments)    Swelling  to eyes  . Ciprofloxacin Hives   Current Meds  Medication Sig  . albuterol (PROAIR HFA) 108 (90 Base) MCG/ACT inhaler Inhale 2 puffs into the lungs every 4 (four) hours as needed for wheezing or shortness of breath.  [name] albuterol (PROVENTIL) (2.5 MG/3ML) 0.083% nebulizer solution Take 3 mLs (2.5 mg total) by nebulization every 6 (six) hours as needed for wheezing or shortness of breath.  . Ascorbic Acid (VITAMIN C) 1000 MG tablet Take 1,000 mg by mouth daily.  [name] BIOTIN PO Take 1 tablet by mouth daily.  . carvedilol (COREG) 12.5 MG tablet Take 1 tablet (12.5 mg total) 2 (two) times daily with a meal by mouth.  . cholecalciferol (VITAMIN D) 1000 units tablet Take 1,000 Units by mouth daily.  .  cyanocobalamin (,VITAMIN B-12,) 1000 MCG/ML injection Inject 1 mL (1,000 mcg total) into the muscle every 30 (thirty) days.  [name] diltiazem (CARDIZEM CD) 120 MG 24 hr capsule Take 1 capsule (120 mg total) by mouth daily.  . fluticasone (FLONASE) 50 MCG/ACT nasal spray Place 1 spray into both nostrils daily. (Patient taking differently: Place 1 spray into both nostrils daily as needed for allergies or rhinitis. )  . furosemide (LASIX) 20 MG tablet Take 1 tablet (20 mg total) daily by mouth. (Patient taking differently: Take 20 mg by mouth daily. Patient take differently (when she needs to).)  . losartan (COZAAR) 100 MG tablet Take 1 tablet (100 mg total) by mouth at bedtime. hypertension  . Multiple Vitamin (MULTIVITAMIN WITH MINERALS) TABS tablet Take 1 tablet by mouth daily.  [name] NIFEdipine (ADALAT CC) 90 MG 24 hr tablet Take 1 tablet (90 mg total) by mouth daily.  . OXYGEN Inhale 2.5-3 L into the lungs at bedtime as needed (Shortness of Breath). 2.5 to 3 liters at night and as needed in the daytime.   . potassium chloride (K-DUR) 10 MEQ tablet Take 1 tablet (10 mEq total) daily by mouth. (Patient taking differently: Take 10 mEq by mouth every other day. )  . sodium chloride (OCEAN) 0.65 % SOLN nasal spray Place 1 spray into both nostrils as needed for congestion.  . Tetrahydrozoline HCl (VISINE OP) Place 2 drops into both eyes daily as needed (for dry eyes).    Past Medical History:  Diagnosis Date  . Anemia   . Anxiety    HX PANIC ATTACKS  . Arthritis    "starting to; in my hands" (07/09/2015)  . Atrial fibrillation ([place])   . Atrial flutter, paroxysmal ([place])   . Chronic asthma with acute exacerbation    "I have chronic asthma all the time; sometimes exacerbations" (07/09/2015)  . Chronic lower back pain   . Cyst of right kidney    "3 of them; dx'd in ~ 01/2015"  . GERD (gastroesophageal reflux disease)   . Heart murmur   . History of blood transfusion    "related to my brain surgery I  think"  . History of pulmonary embolism 07/09/2015  . HIV antibody positive ([place])   . HIV disease ([place])   . Hyperlipidemia   . Hypertension   . Lipodystrophy   . Mild CAD 2013  . Pneumonia 07/09/2015  . Shingles   . Sleep apnea    "never completed part 2 of study; never wore mask" (07/09/2015)   Past Surgical History:  Procedure Laterality Date  . ABDOMINAL HYSTERECTOMY     "robotic laparosopic"  . BRAIN SURGERY  1974   "brain tumor; benign; on top of my brain;  got a plate in there"  . CARDIAC CATHETERIZATION    . TONSILLECTOMY AND ADENOIDECTOMY     Social History   Social History Narrative   Originally from [place] then [place] now [place] 2016 approx   Disabled but going to [place] [id number]   Updated 01/13/2017   family history includes Asthma in her mother; Diabetes in her sister; Heart failure in her mother; Heart murmur in her brother, sister, and sister; Thyroid disease in her sister.   Review of Systems As per HPI  Objective:   Physical Exam BP (!) 144/102 (BP Location: Left Arm, Patient Position: Sitting, Cuff Size: Large)   Pulse 88   Ht 5' 2.75" (1.594 m) Comment: height measured without shoes  Wt 223 lb 2 oz (101.2 kg)   BMI 39.84 kg/m  Obese pleasant black woman, seems anxious and mildly dyspneic The eyes are anicteric The lungs are clear but there are diffusely decreased breath sounds Heart sounds are distant without rubs murmurs or gallop The abdomen is obese, she has moderate epigastric tenderness without rebound or guarding.  There is no obvious organomegaly mass or hernia The extremities are free of edema cyanosis or clubbing She appears alert and oriented but she is very anxious   Data reviewed includes infectious disease notes and labs in the computer from January 2019 and my previous May 2018 note.  April 2018 CT renal stone study and ultrasound have shown kidney cysts    09/01/2017 Lumbosacral MR spine demonstrated foraminal  narrowing left greater than right at L3-4 with possible left L3 nerve root encroachment and degenerative anterolisthesis at L4-5 contributing to mild spinal stenosis which was multifactorial and mild narrowing of the lateral recesses and foramina bilaterally  On 09/02/2017 creatinine was 1.19 with GFR 56 which is stable.  She has chronic kidney disease stage II.  CO2 was elevated at 35 otherwise the metabolic panel was normal CBC was normal and HIV was nondetectable with a CD4 absolute count of 520

## 2017-09-08 ENCOUNTER — Telehealth: Payer: Self-pay | Admitting: Pulmonary Disease

## 2017-09-08 NOTE — Telephone Encounter (Signed)
Called and spoke to pt. Pt states she was summoned for jury duty and is requesting to be excused. Pt states she is too SOB and does not have transportation to attend jury duty. Pt states she already received an excuse from her cardiologist. Pt is now requesting a letter from Dr. Elsworth Soho excusing her.   Dr. Elsworth Soho please advise. Thanks.

## 2017-09-09 ENCOUNTER — Encounter: Payer: Self-pay | Admitting: Cardiovascular Disease

## 2017-09-09 ENCOUNTER — Ambulatory Visit: Payer: Medicare Other | Admitting: Adult Health

## 2017-09-09 NOTE — Telephone Encounter (Signed)
Asthma is not an excuse Only daytime oxygen can be Hopefully, cardiologist letter should suffice

## 2017-09-09 NOTE — Telephone Encounter (Signed)
Pt has been informed of below message and voiced her understanding. Nothing further is needed.

## 2017-09-09 NOTE — Telephone Encounter (Signed)
Shannon [name] is at acceptable risk for surgery on her wrist and back.

## 2017-09-10 DIAGNOSIS — M4317 Spondylolisthesis, lumbosacral region: Secondary | ICD-10-CM | POA: Diagnosis not present

## 2017-09-12 DIAGNOSIS — J189 Pneumonia, unspecified organism: Secondary | ICD-10-CM | POA: Diagnosis not present

## 2017-09-12 DIAGNOSIS — G4733 Obstructive sleep apnea (adult) (pediatric): Secondary | ICD-10-CM | POA: Diagnosis not present

## 2017-09-13 ENCOUNTER — Inpatient Hospital Stay: Admission: RE | Admit: 2017-09-13 | Payer: Medicare Other | Source: Ambulatory Visit

## 2017-09-13 NOTE — Telephone Encounter (Signed)
Sent to Dr Caralyn Guile via Sallee Provencal

## 2017-09-14 ENCOUNTER — Ambulatory Visit: Payer: Medicare Other | Admitting: Adult Health

## 2017-09-22 ENCOUNTER — Telehealth: Payer: Self-pay | Admitting: Cardiovascular Disease

## 2017-09-22 ENCOUNTER — Encounter: Payer: Self-pay | Admitting: Internal Medicine

## 2017-09-22 ENCOUNTER — Ambulatory Visit: Payer: Medicare Other | Admitting: Internal Medicine

## 2017-09-22 NOTE — Telephone Encounter (Signed)
   [facility] Pre-operative Risk Assessment    Request for surgical clearance:  1. What type of surgery is being performed? Epidural steroid injection   2. When is this surgery scheduled? Oct 11, 2017   3. What type of clearance is required (medical clearance vs. Pharmacy clearance to hold med vs. Both)? Pharmacy clearance  4. Are there any medications that need to be held prior to surgery and how long? Xarelto - 3 days    5. Practice name and name of physician performing surgery? Dr. Clydell Hakim @ [facility]    6. What is your office phone and fax number? (f) [phone]  (p) [phone]   7. Anesthesia type (None, local, MAC, general) ? None or not specified    Shannon [name] 09/22/2017, 4:25 PM  _________________________________________________________________   (provider comments below)

## 2017-09-23 ENCOUNTER — Inpatient Hospital Stay: Admission: RE | Admit: 2017-09-23 | Payer: Medicare Other | Source: Ambulatory Visit

## 2017-09-23 ENCOUNTER — Other Ambulatory Visit: Payer: Self-pay | Admitting: Internal Medicine

## 2017-09-23 DIAGNOSIS — E538 Deficiency of other specified B group vitamins: Secondary | ICD-10-CM

## 2017-09-23 NOTE — Telephone Encounter (Signed)
Patient with diagnosis of atrial fibrillation on Xarelto for anticoagulation.    Procedure: epidural steroid injection Date of procedure: 10/11/17  CHADS2-VASc score of  5 (HTN, stroke/tia/thromboembolism x 2, CAD, female)  CrCl 77 Platelet count 182  Based on her history of thromboembolism (2016) would bridge if patient were on warfarin.  Because she is on Xarelto, will review with Dr. Oval Linsey regarding how long patient can be off Xarelto.  Would prefer 2 days, Surgery is requesting 3.

## 2017-09-27 ENCOUNTER — Encounter: Payer: Self-pay | Admitting: Internal Medicine

## 2017-09-27 NOTE — Telephone Encounter (Signed)
   Primary Cardiologist: Skeet Latch, MD  Chart reviewed as part of pre-operative protocol coverage.   Anticoagulation was reviewed by pharmacy team and Dr. Oval Linsey. Per Dr. Oval Linsey, [place] to hold Xarelto 3 days prior to procedure.  I will route this recommendation to the requesting party via Epic fax function and remove from pre-op pool.  Please call with questions.  Charlie Pitter, PA-C 09/27/2017, 9:01 AM

## 2017-09-27 NOTE — Telephone Encounter (Signed)
OK to hold 3 days.

## 2017-09-28 ENCOUNTER — Ambulatory Visit (INDEPENDENT_AMBULATORY_CARE_PROVIDER_SITE_OTHER): Payer: Medicare Other | Admitting: Internal Medicine

## 2017-09-28 ENCOUNTER — Ambulatory Visit: Payer: Medicare Other | Admitting: Cardiovascular Disease

## 2017-09-28 DIAGNOSIS — R682 Dry mouth, unspecified: Secondary | ICD-10-CM | POA: Diagnosis not present

## 2017-09-28 DIAGNOSIS — Z7952 Long term (current) use of systemic steroids: Secondary | ICD-10-CM | POA: Diagnosis not present

## 2017-09-28 DIAGNOSIS — J441 Chronic obstructive pulmonary disease with (acute) exacerbation: Secondary | ICD-10-CM

## 2017-09-28 DIAGNOSIS — J3489 Other specified disorders of nose and nasal sinuses: Secondary | ICD-10-CM

## 2017-09-28 DIAGNOSIS — Z9981 Dependence on supplemental oxygen: Secondary | ICD-10-CM

## 2017-09-28 MED ORDER — BIOTENE DRY MOUTH GENTLE MT LIQD: 1.0000 | Freq: Two times a day (BID) | OROMUCOSAL | 0 refills | Status: DC

## 2017-09-28 MED ORDER — SALINE SPRAY 0.65 % NA SOLN: 1.0000 | NASAL | 1 refills | Status: DC | PRN

## 2017-09-28 MED ORDER — DOXYCYCLINE HYCLATE 50 MG PO CAPS: 50.0000 mg | ORAL_CAPSULE | Freq: Two times a day (BID) | ORAL | 0 refills | Status: DC

## 2017-09-28 NOTE — Patient Instructions (Addendum)
It was a pleasure to see you today Shannon [name],   I have sent in doxycycline for your COPD exacerbation, please take this two times daily for the next 5 days.   For your dry mouth and dry nose I have sent in prescriptions for a nose spray and mouthwash to your pharmacy, the best thing for these symptoms is actually drinking plenty of water.   You are needing more oxygen, please start using 1 liter during the day in addition to the oxygen that you already use at night. When you see your pulmonologist on 2/12 please have them check your oxygen level while you walk without oxygen to see if you can go back to wearing this only at night.    FOLLOW-UP INSTRUCTIONS When: 2-3 month with Dr. Daryll Drown   For: General check up  What to bring: your medication bottles

## 2017-09-28 NOTE — Assessment & Plan Note (Signed)
She describes a feeling of dryness in her mouth and nose, this is worse first thing in the morning and likely related to her oxygen use. She has no symptoms limiting her intake of liquids and says that she has been trying to stay well hydrated.  - Prescribed ocean nasal spray  - prescribed biotene

## 2017-09-28 NOTE — Assessment & Plan Note (Signed)
Symptoms began 5 days ago- sinus tenderness and pressure, cough productive of green phlegm. She doesn't have a cough at baseline. She became short of breath while walking in the clinic, pulse ox was found to be 85% on room air and improved to 92% when she was placed on nasal canula. She was ambulated and maintained an SpO2 in the low 90s on 1 liter of nasal canula. She has a known diagnosis of COPD and asthma. Increased cough, cough purulence, and oxygen requirement are concerning for mild COPD exacerbation. She has many concerns about symptoms which she contributes to prednisone use - dryness of her oropharynx, edema, buffalo hump and many more. She is not wheezing on exam and I am not sure that she needs steroids at this time.  - Doxycycline 50 BID x 5 days  - 1 liter nasal canula 24 hours daily until she follows up with pulmonology next week to recheck ambulating O2 sat on room air - Follow up with pulmonology scheduled in one week

## 2017-09-28 NOTE — Progress Notes (Signed)
CC: cough, dehydration   HPI:  [name] is a 62 y.o. with PMH as listed below who presents for acute concern of cough, dehydration. Please see the assessment and plans for the status of the patient chronic medical problems.    Past Medical History:  Diagnosis Date  . Anemia   . Anxiety    HX PANIC ATTACKS  . Arthritis    "starting to; in my hands" (07/09/2015)  . Atrial fibrillation ([place])   . Atrial flutter, paroxysmal ([place])   . Chronic asthma with acute exacerbation    "I have chronic asthma all the time; sometimes exacerbations" (07/09/2015)  . Chronic lower back pain   . Cyst of right kidney    "3 of them; dx'd in ~ 01/2015"  . GERD (gastroesophageal reflux disease)   . Heart murmur   . History of blood transfusion    "related to my brain surgery I think"  . History of pulmonary embolism 07/09/2015  . HIV antibody positive ([place])   . HIV disease ([place])   . Hyperlipidemia   . Hypertension   . Lipodystrophy   . Mild CAD 2013  . Pneumonia 07/09/2015  . Shingles   . Sleep apnea    "never completed part 2 of study; never wore mask" (07/09/2015)   Review of Systems:  Refer to history of present illness and assessment and plans for pertinent review of systems, all others reviewed and negative  Physical Exam:  Vitals:   09/28/17 1358  BP: (!) 157/99  Pulse: 92  Temp: 98 F (36.7 C)  TempSrc: Oral  SpO2: 92%  Weight: 223 lb 12.8 oz (101.5 kg)  Height: 5' 2.75" (1.594 m)   General: well appearing, no acute distress  HEENT: mucous membranes are dry, nasal turbinates are erythematous and dry, tenderness on palpation of the frontal and maxillary sinuses  Cardiac: RRR, no murmur appreciated  Pulm: decreased air movement but lungs are clear to auscultation, no wheezing, no respiratory distress, no coughing during the course of my interview or exam   Assessment & Plan:   COPD exacerbation  Symptoms began 5 days ago- sinus tenderness and pressure, cough  productive of green phlegm. She doesn't have a cough at baseline. She became short of breath while walking in the clinic, pulse ox was found to be 85% on room air and improved to 92% when she was placed on nasal canula. She was ambulated and maintained an SpO2 in the low 90s on 1 liter of nasal canula. She has a known diagnosis of COPD and asthma. Increased cough, cough purulence, and oxygen requirement are concerning for mild COPD exacerbation. She has many concerns about symptoms which she contributes to prednisone use - dryness of her oropharynx, edema, buffalo hump and many more. She is not wheezing on exam and I am not sure that she needs steroids at this time.  - Doxycycline 50 BID x 5 days  - 1 liter nasal canula 24 hours daily until she follows up with pulmonology next week to recheck ambulating O2 sat on room air - Follow up with pulmonology scheduled in one week   Dryness  She describes a feeling of dryness in her mouth and nose, this is worse first thing in the morning and likely related to her oxygen use. She has no symptoms limiting her intake of liquids and says that she has been trying to stay well hydrated.  - Prescribed ocean nasal spray  - prescribed biotene   See  Encounters Tab for problem based charting.  Patient discussed with Dr. Angelia Mould

## 2017-09-29 NOTE — Progress Notes (Signed)
Internal Medicine Clinic Attending  Case discussed with Dr. Blum at the time of the visit.  We reviewed the resident's history and exam and pertinent patient test results.  I agree with the assessment, diagnosis, and plan of care documented in the resident's note. 

## 2017-10-05 ENCOUNTER — Encounter: Payer: Self-pay | Admitting: Pulmonary Disease

## 2017-10-05 ENCOUNTER — Ambulatory Visit (INDEPENDENT_AMBULATORY_CARE_PROVIDER_SITE_OTHER): Payer: Medicare Other | Admitting: Pulmonary Disease

## 2017-10-05 VITALS — BP 124/84 | HR 89 | Ht 62.75 in | Wt 223.8 lb

## 2017-10-05 DIAGNOSIS — R0902 Hypoxemia: Secondary | ICD-10-CM | POA: Diagnosis not present

## 2017-10-05 DIAGNOSIS — J4541 Moderate persistent asthma with (acute) exacerbation: Secondary | ICD-10-CM

## 2017-10-05 DIAGNOSIS — G4734 Idiopathic sleep related nonobstructive alveolar hypoventilation: Secondary | ICD-10-CM | POA: Diagnosis not present

## 2017-10-05 MED ORDER — FLUTICASONE-UMECLIDIN-VILANT 100-62.5-25 MCG/INH IN AEPB: 1.0000 | INHALATION_SPRAY | Freq: Every day | RESPIRATORY_TRACT | 0 refills | Status: DC

## 2017-10-05 NOTE — Progress Notes (Signed)
   Subjective:    Patient ID: Shannon [name], female    DOB: 06-19-1956, 62 y.o.   MRN: [id number]  HPI  62 [place] smokerfollowed for chronic severe asthmaand nocturnal hypoxemia on O2 1-2 L/m .  HIV dz f/by ID clinic - CD4 620 04/2017  She continues to complain of increased dyspnea.  Oxygen saturation has been noted to drop to 85-80% on walking.  She walks in at 90% today. She denies wheezing.  She has not compliant with her Symbicort because this makes her gag.  She has various problems with other medications. She has numerous other issues that she wants to discuss today Lipodystrophy caused by HAART to the point where this is causing buffalo hump & and-back pain, she has seen neurosurgery for this  -Oxygen levels dropping and weight loss. -Jury duty letter  Prednisone causes weight gain and increase in appetite  Denies any change in her environment or any clear triggers when detailed history obtained  She desaturated on walking 25% and recovered with oxygen  Significant tests/ events reviewed  CTa chest 12/2016 , 01/2017 >neg for PE , multifocal scarring bilaterally PFT (12/04/15) FEV1/FVC 50%, FEV1 0.64 31%.+++BD response. DLCO 66%. Sleep study in 04/2016 was (-) for OSA   Past Medical History:  Diagnosis Date  . Anemia   . Anxiety    HX PANIC ATTACKS  . Arthritis    "starting to; in my hands" (07/09/2015)  . Atrial fibrillation ([place])   . Atrial flutter, paroxysmal ([place])   . Chronic asthma with acute exacerbation    "I have chronic asthma all the time; sometimes exacerbations" (07/09/2015)  . Chronic lower back pain   . Cyst of right kidney    "3 of them; dx'd in ~ 01/2015"  . GERD (gastroesophageal reflux disease)   . Heart murmur   . History of blood transfusion    "related to my brain surgery I think"  . History of pulmonary embolism 07/09/2015  . HIV antibody positive ([place])   . HIV disease ([place])   . Hyperlipidemia   . Hypertension     . Lipodystrophy   . Mild CAD 2013  . Pneumonia 07/09/2015  . Shingles   . Sleep apnea    "never completed part 2 of study; never wore mask" (07/09/2015)     Review of Systems neg for any significant sore throat, dysphagia, itching, sneezing, nasal congestion or excess/ purulent secretions, fever, chills, sweats, unintended wt loss, pleuritic or exertional cp, hempoptysis, orthopnea pnd or change in chronic leg swelling. Also denies presyncope, palpitations, heartburn, abdominal pain, nausea, vomiting, diarrhea or change in bowel or urinary habits, dysuria,hematuria, rash, arthralgias, visual complaints, headache, numbness weakness or ataxia.     Objective:   Physical Exam   Gen. Pleasant, obese, in no distress ENT - no lesions, no post nasal drip Neck: No JVD, no thyromegaly, no carotid bruits Lungs: no use of accessory muscles, no dullness to percussion, decreased without rales or rhonchi  Cardiovascular: Rhythm regular, heart sounds  normal, no murmurs or gallops, no peripheral edema Musculoskeletal: No deformities, no cyanosis or clubbing , no tremors        Assessment & Plan:

## 2017-10-05 NOTE — Patient Instructions (Addendum)
Sample of Trelegy once daily, rinse mouth after use. Call for prescription if you do not have any side effects with this.   -referral to pulmonary rehab.  We will send in prescription for portable oxygen and nebulizer

## 2017-10-06 NOTE — Assessment & Plan Note (Signed)
Sample of Trelegy once daily, rinse mouth after use -not sure that she needs long-acting  anticholinergics will continue for now Call for prescription if you do not have any side effects with this.   -referral to pulmonary rehab.

## 2017-10-06 NOTE — Assessment & Plan Note (Signed)
Ct O2 during sleep She desaturated on walking today and We will send in prescription for portable oxygen

## 2017-10-08 ENCOUNTER — Other Ambulatory Visit: Payer: Self-pay | Admitting: Internal Medicine

## 2017-10-12 ENCOUNTER — Telehealth (HOSPITAL_COMMUNITY): Payer: Self-pay

## 2017-10-12 ENCOUNTER — Other Ambulatory Visit: Payer: Self-pay

## 2017-10-12 DIAGNOSIS — R109 Unspecified abdominal pain: Secondary | ICD-10-CM

## 2017-10-12 NOTE — Telephone Encounter (Signed)
Patients insurance is active and benefits verified through [facility] - No co-pay, no deductible, out of pocket amount of $6,700/$0.00 has been met, no co-insurance, and no pre-authorization is required. Spoke with [facility] - Reference [id number]  Patient will be covered at 100%

## 2017-10-12 NOTE — Progress Notes (Signed)
bun

## 2017-10-13 ENCOUNTER — Telehealth (HOSPITAL_COMMUNITY): Payer: Self-pay

## 2017-10-13 DIAGNOSIS — G4733 Obstructive sleep apnea (adult) (pediatric): Secondary | ICD-10-CM | POA: Diagnosis not present

## 2017-10-13 DIAGNOSIS — J189 Pneumonia, unspecified organism: Secondary | ICD-10-CM | POA: Diagnosis not present

## 2017-10-13 NOTE — Telephone Encounter (Signed)
Called to speak with patient in regards to Pulmonary Rehab - patient is interested in the program. Scheduled orientation on 11/05/2017 at 1:30. Patient will attend the 1:30pm exc class. Went over insurance with patient and patient stated she understands.

## 2017-10-14 ENCOUNTER — Other Ambulatory Visit: Payer: Self-pay

## 2017-10-14 ENCOUNTER — Ambulatory Visit: Payer: Medicare Other | Admitting: Internal Medicine

## 2017-10-21 ENCOUNTER — Other Ambulatory Visit (INDEPENDENT_AMBULATORY_CARE_PROVIDER_SITE_OTHER): Payer: Medicare Other

## 2017-10-21 DIAGNOSIS — R109 Unspecified abdominal pain: Secondary | ICD-10-CM

## 2017-10-21 LAB — BUN: BUN: 14 mg/dL (ref 6–23)

## 2017-10-21 LAB — CREATININE, SERUM: Creatinine, Ser: 1.2 mg/dL (ref 0.40–1.20)

## 2017-10-23 ENCOUNTER — Other Ambulatory Visit: Payer: Self-pay | Admitting: Internal Medicine

## 2017-10-23 DIAGNOSIS — J4540 Moderate persistent asthma, uncomplicated: Secondary | ICD-10-CM

## 2017-10-23 DIAGNOSIS — E538 Deficiency of other specified B group vitamins: Secondary | ICD-10-CM

## 2017-10-26 ENCOUNTER — Ambulatory Visit: Payer: Medicare Other | Admitting: Internal Medicine

## 2017-10-26 ENCOUNTER — Inpatient Hospital Stay: Admission: RE | Admit: 2017-10-26 | Payer: Medicare Other | Source: Ambulatory Visit

## 2017-10-28 ENCOUNTER — Encounter (INDEPENDENT_AMBULATORY_CARE_PROVIDER_SITE_OTHER): Payer: Self-pay

## 2017-11-02 ENCOUNTER — Encounter (HOSPITAL_COMMUNITY): Payer: Self-pay | Admitting: *Deleted

## 2017-11-02 ENCOUNTER — Ambulatory Visit (INDEPENDENT_AMBULATORY_CARE_PROVIDER_SITE_OTHER): Payer: Medicare Other | Admitting: Adult Health

## 2017-11-02 ENCOUNTER — Emergency Department (HOSPITAL_COMMUNITY): Payer: Medicare Other

## 2017-11-02 ENCOUNTER — Encounter: Payer: Self-pay | Admitting: Adult Health

## 2017-11-02 ENCOUNTER — Observation Stay (HOSPITAL_COMMUNITY)
Admission: EM | Admit: 2017-11-02 | Discharge: 2017-11-04 | Disposition: A | Discharge status: Home / Self Care | Payer: Medicare Other | Attending: Internal Medicine | Admitting: Internal Medicine

## 2017-11-02 ENCOUNTER — Other Ambulatory Visit: Payer: Self-pay

## 2017-11-02 DIAGNOSIS — R140 Abdominal distension (gaseous): Secondary | ICD-10-CM | POA: Insufficient documentation

## 2017-11-02 DIAGNOSIS — R05 Cough: Secondary | ICD-10-CM | POA: Diagnosis not present

## 2017-11-02 DIAGNOSIS — J4541 Moderate persistent asthma with (acute) exacerbation: Secondary | ICD-10-CM

## 2017-11-02 DIAGNOSIS — I129 Hypertensive chronic kidney disease with stage 1 through stage 4 chronic kidney disease, or unspecified chronic kidney disease: Secondary | ICD-10-CM | POA: Diagnosis not present

## 2017-11-02 DIAGNOSIS — Z7901 Long term (current) use of anticoagulants: Secondary | ICD-10-CM | POA: Insufficient documentation

## 2017-11-02 DIAGNOSIS — I4892 Unspecified atrial flutter: Secondary | ICD-10-CM | POA: Diagnosis not present

## 2017-11-02 DIAGNOSIS — Z6841 Body Mass Index (BMI) 40.0 and over, adult: Secondary | ICD-10-CM | POA: Insufficient documentation

## 2017-11-02 DIAGNOSIS — J9601 Acute respiratory failure with hypoxia: Secondary | ICD-10-CM | POA: Diagnosis not present

## 2017-11-02 DIAGNOSIS — B20 Human immunodeficiency virus [HIV] disease: Secondary | ICD-10-CM | POA: Diagnosis not present

## 2017-11-02 DIAGNOSIS — Z86711 Personal history of pulmonary embolism: Secondary | ICD-10-CM | POA: Diagnosis not present

## 2017-11-02 DIAGNOSIS — G4734 Idiopathic sleep related nonobstructive alveolar hypoventilation: Secondary | ICD-10-CM

## 2017-11-02 DIAGNOSIS — K5900 Constipation, unspecified: Secondary | ICD-10-CM

## 2017-11-02 DIAGNOSIS — G4730 Sleep apnea, unspecified: Secondary | ICD-10-CM | POA: Insufficient documentation

## 2017-11-02 DIAGNOSIS — Z79899 Other long term (current) drug therapy: Secondary | ICD-10-CM | POA: Insufficient documentation

## 2017-11-02 DIAGNOSIS — I2510 Atherosclerotic heart disease of native coronary artery without angina pectoris: Secondary | ICD-10-CM | POA: Diagnosis not present

## 2017-11-02 DIAGNOSIS — R079 Chest pain, unspecified: Secondary | ICD-10-CM | POA: Diagnosis not present

## 2017-11-02 DIAGNOSIS — Z7951 Long term (current) use of inhaled steroids: Secondary | ICD-10-CM | POA: Insufficient documentation

## 2017-11-02 DIAGNOSIS — R0789 Other chest pain: Secondary | ICD-10-CM | POA: Diagnosis not present

## 2017-11-02 DIAGNOSIS — N183 Chronic kidney disease, stage 3 (moderate): Secondary | ICD-10-CM | POA: Diagnosis not present

## 2017-11-02 DIAGNOSIS — J4551 Severe persistent asthma with (acute) exacerbation: Secondary | ICD-10-CM

## 2017-11-02 DIAGNOSIS — R0602 Shortness of breath: Secondary | ICD-10-CM | POA: Diagnosis not present

## 2017-11-02 LAB — COMPREHENSIVE METABOLIC PANEL
ALT: 22 U/L (ref 14–54)
ANION GAP: 10 (ref 5–15)
AST: 25 U/L (ref 15–41)
Albumin: 4 g/dL (ref 3.5–5.0)
Alkaline Phosphatase: 74 U/L (ref 38–126)
BILIRUBIN TOTAL: 0.8 mg/dL (ref 0.3–1.2)
BUN: 14 mg/dL (ref 6–20)
CO2: 30 mmol/L (ref 22–32)
Calcium: 10 mg/dL (ref 8.9–10.3)
Chloride: 101 mmol/L (ref 101–111)
Creatinine, Ser: 1.19 mg/dL — ABNORMAL HIGH (ref 0.44–1.00)
GFR calc Af Amer: 55 mL/min — ABNORMAL LOW (ref >60)
GFR, EST NON AFRICAN AMERICAN: 48 mL/min — AB (ref >60)
Glucose, Bld: 113 mg/dL — ABNORMAL HIGH (ref 65–99)
POTASSIUM: 4.3 mmol/L (ref 3.5–5.1)
Sodium: 141 mmol/L (ref 135–145)
TOTAL PROTEIN: 7.2 g/dL (ref 6.5–8.1)

## 2017-11-02 LAB — I-STAT TROPONIN, ED: TROPONIN I, POC: 0 ng/mL (ref 0.00–0.08)

## 2017-11-02 LAB — CBC WITH DIFFERENTIAL/PLATELET
BASOS PCT: 0 %
Basophils Absolute: 0 10*3/uL (ref 0.0–0.1)
EOS PCT: 1 %
Eosinophils Absolute: 0 10*3/uL (ref 0.0–0.7)
HEMATOCRIT: 40 % (ref 36.0–46.0)
Hemoglobin: 13 g/dL (ref 12.0–15.0)
Lymphocytes Relative: 17 %
Lymphs Abs: 0.9 10*3/uL (ref 0.7–4.0)
MCH: 31.2 pg (ref 26.0–34.0)
MCHC: 32.5 g/dL (ref 30.0–36.0)
MCV: 95.9 fL (ref 78.0–100.0)
MONO ABS: 0.1 10*3/uL (ref 0.1–1.0)
MONOS PCT: 2 %
NEUTROS ABS: 4.2 10*3/uL (ref 1.7–7.7)
Neutrophils Relative %: 80 %
Platelets: 180 10*3/uL (ref 150–400)
RBC: 4.17 MIL/uL (ref 3.87–5.11)
RDW: 12.9 % (ref 11.5–15.5)
WBC: 5.3 10*3/uL (ref 4.0–10.5)

## 2017-11-02 LAB — INFLUENZA PANEL BY PCR (TYPE A & B)
INFLBPCR: NEGATIVE
Influenza A By PCR: NEGATIVE

## 2017-11-02 LAB — I-STAT CG4 LACTIC ACID, ED: LACTIC ACID, VENOUS: 0.88 mmol/L (ref 0.5–1.9)

## 2017-11-02 LAB — BRAIN NATRIURETIC PEPTIDE: B NATRIURETIC PEPTIDE 5: 71.2 pg/mL (ref 0.0–100.0)

## 2017-11-02 LAB — D-DIMER, QUANTITATIVE: D-Dimer, Quant: 0.28 ug/mL-FEU (ref 0.00–0.50)

## 2017-11-02 MED ORDER — UMECLIDINIUM-VILANTEROL 62.5-25 MCG/INH IN AEPB
1.0000 | INHALATION_SPRAY | Freq: Every day | RESPIRATORY_TRACT | Status: DC
  Administered 2017-11-04: 1 via RESPIRATORY_TRACT
  Filled 2017-11-02: qty 14

## 2017-11-02 MED ORDER — ALBUTEROL (5 MG/ML) CONTINUOUS INHALATION SOLN
10.0000 mg/h | INHALATION_SOLUTION | Freq: Once | RESPIRATORY_TRACT | Status: DC
  Filled 2017-11-02: qty 20

## 2017-11-02 MED ORDER — AZITHROMYCIN 250 MG PO TABS
500.0000 mg | ORAL_TABLET | Freq: Once | ORAL | Status: AC
Start: 2017-11-02 — End: 2017-11-02
  Administered 2017-11-02: 500 mg via ORAL
  Filled 2017-11-02: qty 2

## 2017-11-02 MED ORDER — POLYVINYL ALCOHOL 1.4 % OP SOLN
1.0000 [drp] | OPHTHALMIC | Status: DC | PRN
  Filled 2017-11-02: qty 15

## 2017-11-02 MED ORDER — LORATADINE 10 MG PO TABS: 10.0000 mg | ORAL_TABLET | Freq: Every day | ORAL | Status: DC | PRN

## 2017-11-02 MED ORDER — BIOTENE DRY MOUTH GENTLE MT LIQD: 1.0000 | Freq: Two times a day (BID) | OROMUCOSAL | Status: DC

## 2017-11-02 MED ORDER — ALBUTEROL SULFATE (2.5 MG/3ML) 0.083% IN NEBU: 2.5000 mg | INHALATION_SOLUTION | Freq: Four times a day (QID) | RESPIRATORY_TRACT | Status: DC | PRN

## 2017-11-02 MED ORDER — AZITHROMYCIN 500 MG PO TABS
250.0000 mg | ORAL_TABLET | Freq: Every day | ORAL | Status: DC
  Administered 2017-11-03 – 2017-11-04 (×2): 250 mg via ORAL
  Filled 2017-11-02 (×2): qty 1

## 2017-11-02 MED ORDER — IPRATROPIUM BROMIDE 0.02 % IN SOLN
0.5000 mg | Freq: Once | RESPIRATORY_TRACT | Status: DC
  Filled 2017-11-02: qty 2.5

## 2017-11-02 MED ORDER — SENNA 8.6 MG PO TABS
1.0000 | ORAL_TABLET | Freq: Two times a day (BID) | ORAL | Status: DC
  Administered 2017-11-02 – 2017-11-04 (×4): 8.6 mg via ORAL
  Filled 2017-11-02 (×4): qty 1

## 2017-11-02 MED ORDER — CARVEDILOL 12.5 MG PO TABS
12.5000 mg | ORAL_TABLET | Freq: Two times a day (BID) | ORAL | Status: DC
  Administered 2017-11-03 – 2017-11-04 (×3): 12.5 mg via ORAL
  Filled 2017-11-02 (×3): qty 1

## 2017-11-02 MED ORDER — DILTIAZEM HCL ER COATED BEADS 120 MG PO CP24
120.0000 mg | ORAL_CAPSULE | Freq: Every day | ORAL | Status: DC
  Administered 2017-11-02 – 2017-11-04 (×3): 120 mg via ORAL
  Filled 2017-11-02 (×3): qty 1

## 2017-11-02 MED ORDER — IPRATROPIUM-ALBUTEROL 0.5-2.5 (3) MG/3ML IN SOLN
3.0000 mL | Freq: Four times a day (QID) | RESPIRATORY_TRACT | Status: DC
  Filled 2017-11-02: qty 3

## 2017-11-02 MED ORDER — MAGNESIUM SULFATE 2 GM/50ML IV SOLN
2.0000 g | Freq: Once | INTRAVENOUS | Status: AC
  Administered 2017-11-02: 2 g via INTRAVENOUS
  Filled 2017-11-02: qty 50

## 2017-11-02 MED ORDER — FLUTICASONE-UMECLIDIN-VILANT 100-62.5-25 MCG/INH IN AEPB: 1.0000 | INHALATION_SPRAY | Freq: Every day | RESPIRATORY_TRACT | Status: DC

## 2017-11-02 MED ORDER — POTASSIUM CHLORIDE CRYS ER 10 MEQ PO TBCR
10.0000 meq | EXTENDED_RELEASE_TABLET | ORAL | Status: DC
  Administered 2017-11-03: 10 meq via ORAL
  Filled 2017-11-02 (×2): qty 1

## 2017-11-02 MED ORDER — NIFEDIPINE ER OSMOTIC RELEASE 90 MG PO TB24
90.0000 mg | ORAL_TABLET | Freq: Every day | ORAL | Status: DC
  Administered 2017-11-03 – 2017-11-04 (×2): 90 mg via ORAL
  Filled 2017-11-02 (×2): qty 1

## 2017-11-02 MED ORDER — FLUTICASONE PROPIONATE 50 MCG/ACT NA SUSP
1.0000 | Freq: Every day | NASAL | Status: DC | PRN
  Filled 2017-11-02: qty 16

## 2017-11-02 MED ORDER — RIVAROXABAN 20 MG PO TABS
20.0000 mg | ORAL_TABLET | Freq: Every day | ORAL | Status: DC
  Administered 2017-11-02 – 2017-11-03 (×2): 20 mg via ORAL
  Filled 2017-11-02 (×3): qty 1

## 2017-11-02 MED ORDER — DICLOFENAC SODIUM 1 % TD GEL
2.0000 g | Freq: Four times a day (QID) | TRANSDERMAL | Status: DC
  Administered 2017-11-02 – 2017-11-03 (×3): 2 g via TOPICAL
  Filled 2017-11-02: qty 100

## 2017-11-02 MED ORDER — TETRAHYDROZOLINE HCL 0.05 % OP SOLN: 1.0000 [drp] | Freq: Every day | OPHTHALMIC | Status: DC | PRN

## 2017-11-02 MED ORDER — BICTEGRAVIR-EMTRICITAB-TENOFOV 50-200-25 MG PO TABS
1.0000 | ORAL_TABLET | Freq: Every day | ORAL | Status: DC
  Administered 2017-11-02 – 2017-11-04 (×3): 1 via ORAL
  Filled 2017-11-02 (×3): qty 1

## 2017-11-02 MED ORDER — POLYETHYLENE GLYCOL 3350 17 G PO PACK
17.0000 g | PACK | Freq: Every day | ORAL | Status: DC
  Administered 2017-11-02 – 2017-11-03 (×2): 17 g via ORAL
  Filled 2017-11-02 (×3): qty 1

## 2017-11-02 MED ORDER — METHYLPREDNISOLONE SODIUM SUCC 125 MG IJ SOLR
125.0000 mg | Freq: Once | INTRAMUSCULAR | Status: AC
  Administered 2017-11-02: 125 mg via INTRAVENOUS
  Filled 2017-11-02: qty 2

## 2017-11-02 MED ORDER — PREDNISONE 20 MG PO TABS
40.0000 mg | ORAL_TABLET | Freq: Every day | ORAL | Status: DC
  Filled 2017-11-02 (×2): qty 2

## 2017-11-02 MED ORDER — ACETAMINOPHEN 650 MG RE SUPP: 650.0000 mg | Freq: Four times a day (QID) | RECTAL | Status: DC | PRN

## 2017-11-02 MED ORDER — BUDESONIDE 0.25 MG/2ML IN SUSP
0.2500 mg | Freq: Two times a day (BID) | RESPIRATORY_TRACT | Status: DC
  Administered 2017-11-03 – 2017-11-04 (×3): 0.25 mg via RESPIRATORY_TRACT
  Filled 2017-11-02 (×4): qty 2

## 2017-11-02 MED ORDER — ALBUTEROL SULFATE (2.5 MG/3ML) 0.083% IN NEBU: 5.0000 mg | INHALATION_SOLUTION | Freq: Once | RESPIRATORY_TRACT | Status: DC

## 2017-11-02 MED ORDER — ACETAMINOPHEN 325 MG PO TABS: 650.0000 mg | ORAL_TABLET | Freq: Four times a day (QID) | ORAL | Status: DC | PRN

## 2017-11-02 NOTE — ED Provider Notes (Signed)
[facility]ERGENCY DEPARTMENT Provider Note   CSN: [id number] Arrival date & time: 11/02/17  1140     History   Chief Complaint Chief Complaint  Patient presents with  . Shortness of Breath    HPI Shannon [name] is a 62 y.o. female with history of obesity, asthma, HIV CD4 count 520 is here from [facility] pulmonology for evaluation of hypoxemia and worsening shortness of breath, gradually worsening over the last 3-4 weeks. Patient states that she used to only use 1-2 L nasal cannula at nighttime only however in the last 2 weeks she's had to use 2 L throughout the day, still feels like she is short of breath despite oxygen during exertion. Has been compliant with all her asthma medications without relief. Reports associated chills, increased green sinus discharge, productive cough with yellow mucus, wheezing. O2 saturations at 77% on RA. [place] provider documented pt will require admission to hospital for asthma exacerbation refractory to outpatient tx.   HPI  Past Medical History:  Diagnosis Date  . Anemia   . Anxiety    HX PANIC ATTACKS  . Arthritis    "starting to; in my hands" (07/09/2015)  . Atrial fibrillation ([place])   . Atrial flutter, paroxysmal ([place])   . Chronic asthma with acute exacerbation    "I have chronic asthma all the time; sometimes exacerbations" (07/09/2015)  . Chronic lower back pain   . Cyst of right kidney    "3 of them; dx'd in ~ 01/2015"  . GERD (gastroesophageal reflux disease)   . Heart murmur   . History of blood transfusion    "related to my brain surgery I think"  . History of pulmonary embolism 07/09/2015  . HIV antibody positive ([place])   . HIV disease ([place])   . Hyperlipidemia   . Hypertension   . Lipodystrophy   . Mild CAD 2013  . Pneumonia 07/09/2015  . Shingles   . Sleep apnea    "never completed part 2 of study; never wore mask" (07/09/2015)    Patient Active Problem List   Diagnosis Date Noted  . Acute  respiratory failure with hypoxia ([place]) 11/02/2017  . Oral dryness 09/28/2017  . Acute bronchitis 07/13/2017  . Anticoagulated 05/26/2017  . Allergic rhinitis 05/14/2017  . Right flank pain 05/05/2017  . Atrial flutter ([place]) 03/11/2017  . CAD (coronary artery disease) 01/28/2017  . Morbid obesity with BMI of 40.0-44.9, adult ([place]) 01/13/2017  . On home oxygen therapy 01/13/2017  . Ascending aorta dilatation (HCC) 01/07/2017  . COPD exacerbation ([place]) 12/20/2016  . Low back pain radiating to right lower extremity 12/02/2016  . Bilateral renal cysts 12/02/2016  . Chronic kidney disease (CKD), stage III (moderate) ([place]) 12/01/2016  . Generalized anxiety disorder 10/22/2016  . Hypersomnia 10/01/2016  . BPPV (benign paroxysmal positional vertigo) 09/18/2016  . Nocturnal hypoxemia 05/13/2016  . Cervical radiculopathy 04/14/2016  . GERD (gastroesophageal reflux disease) 01/02/2016  . Vitamin B12 deficiency 10/02/2015  . Inappropriate diet and eating habits 08/15/2015  . Opacity noted on imaging study   . Dyspnea 07/09/2015  . Asthma, moderate persistent 07/09/2015  . HIV disease ([place]) 07/09/2015  . Essential hypertension 07/09/2015  . Mild diastolic dysfunction 63/87/5643    Past Surgical History:  Procedure Laterality Date  . ABDOMINAL HYSTERECTOMY     "robotic laparosopic"  . BRAIN SURGERY  1974   "brain tumor; benign; on top of my brain; got a plate in there"  . CARDIAC CATHETERIZATION    .  TONSILLECTOMY AND ADENOIDECTOMY      OB History    No data available       Home Medications    Prior to Admission medications   Medication Sig Start Date End Date Taking? Authorizing Provider  albuterol (PROVENTIL) (2.5 MG/3ML) 0.083% nebulizer solution Take 3 mLs (2.5 mg total) by nebulization every 6 (six) hours as needed for wheezing or shortness of breath. 02/11/17  Yes Lorella Nimrod, MD  Ascorbic Acid (VITAMIN C) 1000 MG tablet Take 1,000 mg by mouth daily.   Yes [provider]  bictegravir-emtricitabine-tenofovir AF (BIKTARVY) 50-200-25 MG TABS tablet Take 1 tablet by mouth daily. 09/02/17  Yes [name], MD  BIOTIN PO Take 1 tablet by mouth daily.   Yes [provider]  carvedilol (COREG) 12.5 MG tablet Take 1 tablet (12.5 mg total) 2 (two) times daily with a meal by mouth. 07/01/17  Yes Skeet Latch, MD  cholecalciferol (VITAMIN D) 1000 units tablet Take 1,000 Units by mouth daily.   Yes [provider]  cyanocobalamin (,VITAMIN B-12,) 1000 MCG/ML injection ADMINISTER 1 ML(1000 MCG) IN THE MUSCLE EVERY 30 DAYS 10/25/17  Yes [name], MD  diltiazem (CARDIZEM CD) 120 MG 24 hr capsule Take 1 capsule (120 mg total) by mouth daily. 07/13/17 11/02/17 Yes Skeet Latch, MD  fluticasone [facility]) 50 MCG/ACT nasal spray Place 1 spray into both nostrils daily. Patient taking differently: Place 1 spray into both nostrils daily as needed for allergies or rhinitis.  08/18/17  Yes Little, Wenda Overland, MD  Fluticasone-Umeclidin-Vilant (TRELEGY ELLIPTA) 100-62.5-25 MCG/INH AEPB Inhale 1 puff into the lungs daily. 10/05/17  Yes Rigoberto Noel, MD  furosemide (LASIX) 20 MG tablet Take 1 tablet (20 mg total) daily by mouth. Patient taking differently: Take 20 mg by mouth daily. Patient take differently (when she needs to). 07/01/17 11/02/17 Yes Skeet Latch, MD  losartan (COZAAR) 100 MG tablet Take 100 mg by mouth as needed (FOR BLOOD PRESSURE ABOVE 160).   Yes [provider]  Mouthwashes (BIOTENE DRY MOUTH GENTLE) LIQD Use as directed 1 Dose in the mouth or throat 2 (two) times daily. 09/28/17  Yes Ledell Noss, MD  Multiple Vitamin (MULTIVITAMIN WITH MINERALS) TABS tablet Take 1 tablet by mouth daily. 08/29/15  Yes Rice, Resa Miner, MD  NIFEdipine (ADALAT CC) 90 MG 24 hr tablet Take 1 tablet (90 mg total) by mouth daily. 02/10/17  Yes Skeet Latch, MD  potassium chloride (K-DUR) 10 MEQ tablet Take 1 tablet (10 mEq total)  daily by mouth. Patient taking differently: Take 10 mEq by mouth every other day.  07/01/17 11/02/17 Yes Skeet Latch, MD  PROAIR HFA 108 [phone] Base) MCG/ACT inhaler INHALE 2 PUFFS INTO THE LUNGS EVERY 4 HOURS AS NEEDED FOR WHEEZING OR SHORTNESS OF BREATH 10/25/17  Yes [name], MD  rivaroxaban (XARELTO) 20 MG TABS tablet Take 1 tablet (20 mg total) by mouth daily with supper. 05/05/17  Yes [name], MD  sodium chloride (OCEAN) 0.65 % SOLN nasal spray Place 1 spray into both nostrils as needed for congestion. 09/28/17  Yes Ledell Noss, MD  Tetrahydrozoline HCl (VISINE OP) Place 2 drops into both eyes daily as needed (for dry eyes).    Yes [provider]  doxycycline (VIBRAMYCIN) 50 MG capsule Take 1 capsule (50 mg total) by mouth 2 (two) times daily. Patient not taking: Reported on 11/02/2017 09/28/17   Ledell Noss, MD  loratadine (CLARITIN) 10 MG tablet Take 1 tablet (10 mg  total) by mouth daily as needed for allergies. 06/10/16 08/18/17  Alphonzo Grieve, MD  OXYGEN Inhale 2.5-3 L into the lungs at bedtime as needed (Shortness of Breath). 2.5 to 3 liters at night and as needed in the daytime.     [provider]    Family History Family History  Problem Relation Age of Onset  . Asthma Mother   . Heart failure Mother        cardiomyopathy  . Heart murmur Sister   . Heart murmur Brother   . Diabetes Sister   . Thyroid disease Sister   . Heart murmur Sister     Social History Social History   Tobacco Use  . Smoking status: Never Smoker  . Smokeless tobacco: Never Used  Substance Use Topics  . Alcohol use: Yes    Alcohol/week: 0.0 oz    Comment: Rarely.  . Drug use: No     Allergies   Tree extract and Ciprofloxacin   Review of Systems Review of Systems  Constitutional: Positive for chills.  Respiratory: Positive for cough, chest tightness, shortness of breath and wheezing.   Cardiovascular: Positive for chest pain (tightness).  Musculoskeletal:  Positive for back pain (chronic).  All other systems reviewed and are negative.    Physical Exam Updated Vital Signs BP 140/80   Pulse 83   Temp 97.9 F (36.6 C) (Oral)   Resp (!) 23   Ht 5' 2.75" (1.594 m)   Wt 100.7 kg (222 lb)   SpO2 95%   BMI 39.64 kg/m   Physical Exam  Constitutional: She is oriented to person, place, and time. She appears well-developed and well-nourished. No distress.  Non toxic  HENT:  Head: Normocephalic and atraumatic.  Nose: Nose normal.  Mouth/Throat: No oropharyngeal exudate.  Moist mucous membranes   Eyes: Conjunctivae and EOM are normal. Pupils are equal, round, and reactive to light.  Neck: Normal range of motion.  Cardiovascular: Normal rate, regular rhythm and intact distal pulses.  No murmur heard. 2+ DP and radial pulses bilaterally. No LE edema.   Pulmonary/Chest: Effort normal. No respiratory distress. She has decreased breath sounds in the right lower field and the left lower field. She has no wheezes. She has no rales.  Requiring 2L [place] with sat 97%   Abdominal: Soft. Bowel sounds are normal. There is no tenderness.  No G/R/R. No suprapubic or CVA tenderness.   Musculoskeletal: Normal range of motion. She exhibits no deformity.  Neurological: She is alert and oriented to person, place, and time.  Skin: Skin is warm and dry. Capillary refill takes less than 2 seconds.  Psychiatric: She has a normal mood and affect. Her behavior is normal. Judgment and thought content normal.  Nursing note and vitals reviewed.    ED Treatments / Results  Labs (all labs ordered are listed, but only abnormal results are displayed) Labs Reviewed  COMPREHENSIVE METABOLIC PANEL - Abnormal; Notable for the following components:      Result Value   Glucose, Bld 113 (*)    Creatinine, Ser 1.19 (*)    GFR calc non Af Amer 48 (*)    GFR calc Af Amer 55 (*)    All other components within normal limits  CBC WITH DIFFERENTIAL/PLATELET  BRAIN NATRIURETIC  PEPTIDE  D-DIMER, QUANTITATIVE (NOT AT [facility])  INFLUENZA PANEL BY PCR (TYPE A & B)  I-STAT TROPONIN, ED  I-STAT CG4 LACTIC ACID, ED    EKG  EKG Interpretation None  Radiology Dg Chest 2 View  Result Date: 11/02/2017 CLINICAL DATA:  Chest pain. EXAM: CHEST - 2 VIEW COMPARISON:  Radiographs of August 25, 2017. FINDINGS: Stable cardiomegaly. No pneumothorax or pleural effusion is noted. Both lungs are clear. The visualized skeletal structures are unremarkable. IMPRESSION: No active cardiopulmonary disease. Electronically Signed   By: Marijo Conception, M.D.   On: 11/02/2017 14:44    Procedures Procedures (including critical care time)  Medications Ordered in ED Medications  ipratropium (ATROVENT) nebulizer solution 0.5 mg (not administered)  albuterol (PROVENTIL) (2.5 MG/3ML) 0.083% nebulizer solution 5 mg (not administered)  ipratropium-albuterol (DUONEB) 0.5-2.5 (3) MG/3ML nebulizer solution 3 mL (not administered)  predniSONE (DELTASONE) tablet 40 mg (not administered)  azithromycin (ZITHROMAX) tablet 500 mg (500 mg Oral Given 11/02/17 1442)  magnesium sulfate IVPB 2 g 50 mL (2 g Intravenous New Bag/Given 11/02/17 1442)  methylPREDNISolone sodium succinate (SOLU-MEDROL) 125 mg/2 mL injection 125 mg (125 mg Intravenous Given 11/02/17 1442)     Initial Impression / Assessment and Plan / ED Course  I have reviewed the triage vital signs and the nursing notes.  Pertinent labs & imaging results that were available during my care of the patient were reviewed by me and considered in my medical decision making (see chart for details).     62 yo sent from [facility] pulmonology for asthma refractory to outpatient management, gradually worsening in last month. 79% on RA at pulmonology clinic, requiring new oxygen requirement. She has chills, worsening cough with sputum production, sinus drainage.   Labs and imaging reviewed. No leukocytosis, normal lactic acid. D-dimer negative. BNP  normal. Mild elevation in creatinine, 1.19. CXR w/o infiltrate.   Pt received continous alb, atrovent, solumedrol, magnesium in ED and feels better. Will request admission.   Final Clinical Impressions(s) / ED Diagnoses   Final diagnoses:  Moderate persistent asthma with exacerbation    ED Discharge Orders    None       Arlean Hopping 11/02/17 1657    Macarthur Critchley, MD 11/03/17 1349

## 2017-11-02 NOTE — ED Notes (Signed)
Heart Healthy Dinner Tray Ordered @ 1856-per RN-called by Levada Dy

## 2017-11-02 NOTE — ED Notes (Signed)
Pt states she is on 2L home oxygen at bedtime.

## 2017-11-02 NOTE — Assessment & Plan Note (Signed)
Cont on O2 At bedtime   Will need new O2 start with activity at discharge.

## 2017-11-02 NOTE — Assessment & Plan Note (Addendum)
Acute asthmatic bronchitic exacerbation refractory to outpatient treatment.  Now with significant acute on chronic hypoxemia Admit to hospital .  Unfortunately there are no beds available at [facility] or [facility] patient will be transported to the emergency room via EMS for hospital admission Report given to EMS , pt stable for transport.

## 2017-11-02 NOTE — ED Notes (Signed)
Pt does not want any of her medications until she eats dinner.  Dinner tray was ordered.  Dinner tray was ordered at 18:50

## 2017-11-02 NOTE — Progress Notes (Signed)
@Patient  ID: Shannon [name], female    DOB: [birth year], 62 y.o.   MRN: [id number]  Chief Complaint  Patient presents with  . Follow-up    Asthma     Referring provider: [name], MD  HPI: 62 year old female never smoker followed for chronic severe asthma and nocturnal hypoxemia on O2 1-2 L/m .  HIV dz f/by ID clinic   .TEST  CTa chest 12/2016 , 01/2017 >neg for PE , multifocal scarring bilaterally PFT (12/04/15) FEV1/FVC 50%, FEV1 0.64 31%. +++ BD response. DLCO 66%. Sleep study in 04/2016 was (-) for OSA  11/02/2017 Follow up : Asthma and O2 RF  She returns for a one-month follow-up.  Last visit patient with increased asthmatic symptoms along with Symbicort noncompliance due to throat irritation.  She was changed to [place] .  Pt says she is not better and is worse with increased dyspnea, green sinus discharge and productive cough with thick yellow mucus.  Increased wheezing and shortness of breath. Started on Oxygen 2l/m activity at last visit but unfortunately this was not set up. Today on arrival patient's O2 saturation was 79% on room air.  Patient says she is very fatigued and is having a hard time walking.  Denies any hemoptysis, chest pain, orthopnea or increased edema Patient will require admission for further evaluation and treatment options      Allergies  Allergen Reactions  . Tree Extract Swelling and Other (See Comments)    Swelling to eyes  . Ciprofloxacin Hives    There is no immunization history for the selected administration types on file for this patient.  Past Medical History:  Diagnosis Date  . Anemia   . Anxiety    HX PANIC ATTACKS  . Arthritis    "starting to; in my hands" (07/09/2015)  . Atrial fibrillation ([place])   . Atrial flutter, paroxysmal ([place])   . Chronic asthma with acute exacerbation    "I have chronic asthma all the time; sometimes exacerbations" (07/09/2015)  . Chronic lower back pain   . Cyst of right kidney    "3 of  them; dx'd in ~ 01/2015"  . GERD (gastroesophageal reflux disease)   . Heart murmur   . History of blood transfusion    "related to my brain surgery I think"  . History of pulmonary embolism 07/09/2015  . HIV antibody positive ([place])   . HIV disease ([place])   . Hyperlipidemia   . Hypertension   . Lipodystrophy   . Mild CAD 2013  . Pneumonia 07/09/2015  . Shingles   . Sleep apnea    "never completed part 2 of study; never wore mask" (07/09/2015)    Tobacco History: Social History   Tobacco Use  Smoking Status Never Smoker  Smokeless Tobacco Never Used   Counseling given: Not Answered   Outpatient Encounter Medications as of 11/02/2017  Medication Sig  . albuterol (PROVENTIL) (2.5 MG/3ML) 0.083% nebulizer solution Take 3 mLs (2.5 mg total) by nebulization every 6 (six) hours as needed for wheezing or shortness of breath.  . Ascorbic Acid (VITAMIN C) 1000 MG tablet Take 1,000 mg by mouth daily.  . bictegravir-emtricitabine-tenofovir AF (BIKTARVY) 50-200-25 MG TABS tablet Take 1 tablet by mouth daily.  [name] BIOTIN PO Take 1 tablet by mouth daily.  . carvedilol (COREG) 12.5 MG tablet Take 1 tablet (12.5 mg total) 2 (two) times daily with a meal by mouth.  . cholecalciferol (VITAMIN D) 1000 units tablet Take 1,000 Units by mouth daily.  [name]  cyanocobalamin (,VITAMIN B-12,) 1000 MCG/ML injection ADMINISTER 1 ML(1000 MCG) IN THE MUSCLE EVERY 30 DAYS  . fluticasone (FLONASE) 50 MCG/ACT nasal spray Place 1 spray into both nostrils daily. (Patient taking differently: Place 1 spray into both nostrils daily as needed for allergies or rhinitis. )  . Fluticasone-Umeclidin-Vilant (TRELEGY ELLIPTA) 100-62.5-25 MCG/INH AEPB Inhale 1 puff into the lungs daily.  [name] losartan (COZAAR) 100 MG tablet Take 100 mg by mouth as needed (FOR BLOOD PRESSURE ABOVE 160).  . Mouthwashes (BIOTENE DRY MOUTH GENTLE) LIQD Use as directed 1 Dose in the mouth or throat 2 (two) times daily.  . Multiple Vitamin (MULTIVITAMIN  WITH MINERALS) TABS tablet Take 1 tablet by mouth daily.  [name] NIFEdipine (ADALAT CC) 90 MG 24 hr tablet Take 1 tablet (90 mg total) by mouth daily.  . OXYGEN Inhale 2.5-3 L into the lungs at bedtime as needed (Shortness of Breath). 2.5 to 3 liters at night and as needed in the daytime.   [name] PROAIR HFA 108 (90 Base) MCG/ACT inhaler INHALE 2 PUFFS INTO THE LUNGS EVERY 4 HOURS AS NEEDED FOR WHEEZING OR SHORTNESS OF BREATH  . rivaroxaban (XARELTO) 20 MG TABS tablet Take 1 tablet (20 mg total) by mouth daily with supper.  . sodium chloride (OCEAN) 0.65 % SOLN nasal spray Place 1 spray into both nostrils as needed for congestion.  . Tetrahydrozoline HCl (VISINE OP) Place 2 drops into both eyes daily as needed (for dry eyes).   [name] diltiazem (CARDIZEM CD) 120 MG 24 hr capsule Take 1 capsule (120 mg total) by mouth daily.  [name] doxycycline (VIBRAMYCIN) 50 MG capsule Take 1 capsule (50 mg total) by mouth 2 (two) times daily. (Patient not taking: Reported on 11/02/2017)  . furosemide (LASIX) 20 MG tablet Take 1 tablet (20 mg total) daily by mouth. (Patient taking differently: Take 20 mg by mouth daily. Patient take differently (when she needs to).)  . loratadine (CLARITIN) 10 MG tablet Take 1 tablet (10 mg total) by mouth daily as needed for allergies.  . potassium chloride (K-DUR) 10 MEQ tablet Take 1 tablet (10 mEq total) daily by mouth. (Patient taking differently: Take 10 mEq by mouth every other day. )   No facility-administered encounter medications on file as of 11/02/2017.      Review of Systems  Constitutional:   No  weight loss, night sweats,  Fevers, chills,  +fatigue, or  lassitude.  HEENT:   No headaches,  Difficulty swallowing,  Tooth/dental problems, or  Sore throat,                No sneezing, itching, ear ache,  +nasal congestion, post nasal drip,   CV:  No chest pain,  Orthopnea, PND, swelling in lower extremities, anasarca, dizziness, palpitations, syncope.   GI  No heartburn, indigestion,  + abdominal pain, nausea, vomiting, diarrhea, change in bowel habits, loss of appetite, no bloody stools.   Resp:   No chest wall deformity  Skin: no rash or lesions.  GU: no dysuria, change in color of urine, no urgency or frequency.  No flank pain, no hematuria   MS:  No joint pain or swelling.  No decreased range of motion.  No back pain.    Physical Exam  BP (!) 150/92 (BP Location: Left Arm, Cuff Size: Large)   Pulse 91   Ht 5' 2.75" (1.594 m)   Wt 222 lb (100.7 kg)   SpO2 94%   BMI 39.64 kg/m   GEN: A/Ox3; pleasant ,  NAD elderly    HEENT:  [place]/AT,  EACs-clear, TMs-wnl, NOSE-clear drainage  THROAT-clear, no lesions, no postnasal drip or exudate noted.   NECK:  Supple w/ fair ROM; no JVD; normal carotid impulses w/o bruits; no thyromegaly or nodules palpated; no lymphadenopathy.    RESP few trace wheezes  + accessory  muscle use, no dullness to percussion  CARD:  RRR, no m/r/g, no peripheral edema, pulses intact, no cyanosis or clubbing.  GI:   Soft & nt; nml bowel sounds; no organomegaly or masses detected.   Musco: Warm bil, no deformities or joint swelling noted.   Neuro: alert, no focal deficits noted.    Skin: Warm, no lesions or rashes    Lab Results:  CBC    Component Value Date/Time   WBC 5.2 09/02/2017 0946   RBC 4.09 09/02/2017 0946   HGB 12.1 09/02/2017 0946   HGB 12.3 05/05/2017 1016   HCT 37.4 09/02/2017 0946   HCT 39.1 05/05/2017 1016   PLT 182 09/02/2017 0946   PLT 192 05/05/2017 1016   MCV 91.4 09/02/2017 0946   MCV 96 05/05/2017 1016   MCH 29.6 09/02/2017 0946   MCHC 32.4 09/02/2017 0946   RDW 12.4 09/02/2017 0946   RDW 14.0 05/05/2017 1016   LYMPHSABS 1,388 09/02/2017 0946   LYMPHSABS 1.4 05/05/2017 1016   MONOABS 0.6 08/25/2017 0700   EOSABS 78 09/02/2017 0946   EOSABS 0.1 05/05/2017 1016   BASOSABS 10 09/02/2017 0946   BASOSABS 0.0 05/05/2017 1016    BMET    Component Value Date/Time   NA 141 09/02/2017 0946   NA 143  05/05/2017 1016   K 4.2 09/02/2017 0946   CL 102 09/02/2017 0946   CO2 35 (H) 09/02/2017 0946   GLUCOSE 114 (H) 09/02/2017 0946   BUN 14 10/21/2017 1322   BUN 16 05/05/2017 1016   CREATININE 1.20 10/21/2017 1322   CREATININE 1.19 (H) 09/02/2017 0946   CALCIUM 9.6 09/02/2017 0946   GFRNONAA 49 (L) 09/02/2017 0946   GFRAA 56 (L) 09/02/2017 0946    BNP    Component Value Date/Time   BNP 51.6 08/25/2017 0700   BNP 54.9 11/28/2015 1656    ProBNP No results found for: PROBNP  Imaging: No results found.   Assessment & Plan:   Asthma, moderate persistent Acute asthmatic bronchitic exacerbation refractory to outpatient treatment.  Now with significant acute on chronic hypoxemia Admit to hospital .  Unfortunately there are no beds available at [facility] or [facility] patient will be transported to the emergency room via EMS for hospital admission Report given to EMS , pt stable for transport.    Acute respiratory failure with hypoxia (HCC) Acute on chronic hypoxic respiratory failure.  Patient with significant hypoxemia on arrival today.  O2 saturations at 77% on room air.  Patient was placed on oxygen 2 L with O2 saturations greater than 90%. Patient will require hospitalization.  EMS to transport patient to the emergency room as there are no beds available.  Nocturnal hypoxemia Cont on O2 At bedtime   Will need new O2 start with activity at discharge.       Rexene Edison, NP 11/02/2017

## 2017-11-02 NOTE — ED Notes (Signed)
Attempted to walk pt in hall, when pt sat on side of bed O2 was 86 at this point. Got pt back into bed on 2L and O2 is back to 93.

## 2017-11-02 NOTE — ED Notes (Signed)
Pt called out requesting water, states she is very dehydrated. RN to be made aware.

## 2017-11-02 NOTE — Assessment & Plan Note (Signed)
Acute on chronic hypoxic respiratory failure.  Patient with significant hypoxemia on arrival today.  O2 saturations at 77% on room air.  Patient was placed on oxygen 2 L with O2 saturations greater than 90%. Patient will require hospitalization.  EMS to transport patient to the emergency room as there are no beds available.

## 2017-11-02 NOTE — Patient Instructions (Signed)
Transport to ER via EMS

## 2017-11-02 NOTE — ED Triage Notes (Signed)
C/o sob  X 2 months progressive getting worse. Also c/o stomach problems  States she is scheduled for a CT scan on Thurs. For abd. Problems. States she has a sinus infection. C/o cough slight green sputum

## 2017-11-02 NOTE — H&P (Signed)
Date: 11/02/2017               Patient Name:  Shannon [name] MRN: [id number]  DOB: 04-04-1956 Age / Sex: 62 y.o., female   PCP: [name], MD         Medical Service: Internal Medicine Teaching Service         Attending Physician: Dr. Lucious Groves, DO    First Contact: Dr. Frederico Hamman Pager: [phone]  Second Contact: Dr. Jari Favre Pager: [phone]       After Hours (After 5p/  First Contact Pager: [phone]  weekends / holidays): Second Contact Pager: [phone]   Chief Complaint: shortness of breath  History of Present Illness:  Mr. Mikrut is a 62 yo female with PMH of HTN, HLD, Asthma, chronic respiratory failure on 2L [place] at night and PRN daily, and well controlled HIV presenting to [facility] from pulmonology office for dyspnea and new oxygen requirement at rest.   Patient endorses about 2 wk history of worsening congestion, frontal headaches, rhinorrhea, post-nasal drip, mildly productive cough with green sputum, chills, chest tightness, wheezing, and worsening dyspnea especially with climbing stairs. She endorses significant problems with environmental allergies since moving to [place] which flare up with weather changes; she denies taking flonase and claratin regularly, only uses them as needed, but has not used them recently even with her increased congestion. She endorses using her Trelegy regularly, however reports significant discomfort using it as it causes her to gag. She was prescribed doxycycline 2 wks ago however stated she could not take it due to symptoms of dizziness when taking it. She is supposed to be on supplemental oxygen with ambulation as per pulmonology note, however patient states she has not been able to get extra oxygen or a portable tank from her medical supply company. She endorses chronic problems with bloating which causes increased dyspnea due to pressure on her chest, as well as orthopnea.    Patient denies ever having to be on BiPAP or requiring intubation.  Patient  endorses constipation; she endorses using stool softeners at home without relief, however is unable to name them.  She denies fevers, vomiting, diarrhea.  At pulmonology, she was noted to have O2 sat on RA at rest to 77% which improved to >90% on 2L [place]; she was noted to have increased work of breathing. She was transferred to the ED via EMS. In the ED, she was afebrile, w/o leukocytosis, clear CXR; exam per ED provider noted mild wheezing. She was given IV Mag sulfate, IV solumedrol, albuterol, atrovent, and azithromycin.   Meds:  Current Meds  Medication Sig  . albuterol (PROVENTIL) (2.5 MG/3ML) 0.083% nebulizer solution Take 3 mLs (2.5 mg total) by nebulization every 6 (six) hours as needed for wheezing or shortness of breath.  . Ascorbic Acid (VITAMIN C) 1000 MG tablet Take 1,000 mg by mouth daily.  . bictegravir-emtricitabine-tenofovir AF (BIKTARVY) 50-200-25 MG TABS tablet Take 1 tablet by mouth daily.  [name] BIOTIN PO Take 1 tablet by mouth daily.  . carvedilol (COREG) 12.5 MG tablet Take 1 tablet (12.5 mg total) 2 (two) times daily with a meal by mouth.  . cholecalciferol (VITAMIN D) 1000 units tablet Take 1,000 Units by mouth daily.  . cyanocobalamin (,VITAMIN B-12,) 1000 MCG/ML injection ADMINISTER 1 ML(1000 MCG) IN THE MUSCLE EVERY 30 DAYS  . diltiazem (CARDIZEM CD) 120 MG 24 hr capsule Take 1 capsule (120 mg total) by mouth daily.  . fluticasone (FLONASE) 50 MCG/ACT  nasal spray Place 1 spray into both nostrils daily. (Patient taking differently: Place 1 spray into both nostrils daily as needed for allergies or rhinitis. )  . Fluticasone-Umeclidin-Vilant (TRELEGY ELLIPTA) 100-62.5-25 MCG/INH AEPB Inhale 1 puff into the lungs daily.  . furosemide (LASIX) 20 MG tablet Take 1 tablet (20 mg total) daily by mouth. (Patient taking differently: Take 20 mg by mouth daily. Patient take differently (when she needs to).)  . losartan (COZAAR) 100 MG tablet Take 100 mg by mouth as needed (FOR BLOOD  PRESSURE ABOVE 160).  . Mouthwashes (BIOTENE DRY MOUTH GENTLE) LIQD Use as directed 1 Dose in the mouth or throat 2 (two) times daily.  . Multiple Vitamin (MULTIVITAMIN WITH MINERALS) TABS tablet Take 1 tablet by mouth daily.  [name] NIFEdipine (ADALAT CC) 90 MG 24 hr tablet Take 1 tablet (90 mg total) by mouth daily.  . potassium chloride (K-DUR) 10 MEQ tablet Take 1 tablet (10 mEq total) daily by mouth. (Patient taking differently: Take 10 mEq by mouth every other day. )  . PROAIR HFA 108 (90 Base) MCG/ACT inhaler INHALE 2 PUFFS INTO THE LUNGS EVERY 4 HOURS AS NEEDED FOR WHEEZING OR SHORTNESS OF BREATH  . rivaroxaban (XARELTO) 20 MG TABS tablet Take 1 tablet (20 mg total) by mouth daily with supper.  . sodium chloride (OCEAN) 0.65 % SOLN nasal spray Place 1 spray into both nostrils as needed for congestion.  . Tetrahydrozoline HCl (VISINE OP) Place 2 drops into both eyes daily as needed (for dry eyes).      Allergies: Allergies as of 11/02/2017 - Review Complete 11/02/2017  Allergen Reaction Noted  . Tree extract Swelling and Other (See Comments) 04/13/2016  . Ciprofloxacin Hives 07/09/2015   Past Medical History:  Diagnosis Date  . Anemia   . Anxiety    HX PANIC ATTACKS  . Arthritis    "starting to; in my hands" (07/09/2015)  . Atrial fibrillation ([place])   . Atrial flutter, paroxysmal ([place])   . Chronic asthma with acute exacerbation    "I have chronic asthma all the time; sometimes exacerbations" (07/09/2015)  . Chronic lower back pain   . Cyst of right kidney    "3 of them; dx'd in ~ 01/2015"  . GERD (gastroesophageal reflux disease)   . Heart murmur   . History of blood transfusion    "related to my brain surgery I think"  . History of pulmonary embolism 07/09/2015  . HIV antibody positive ([place])   . HIV disease ([place])   . Hyperlipidemia   . Hypertension   . Lipodystrophy   . Mild CAD 2013  . Pneumonia 07/09/2015  . Shingles   . Sleep apnea    "never completed part 2 of  study; never wore mask" (07/09/2015)    Family History: Mother with asthma and HF.  Social History: Patient denies tobacco use or illicit drug use; she endorses rare EtOH use. Originally from [place].  Review of Systems: A complete ROS was negative except as per HPI.   Physical Exam: Blood pressure (!) 175/102, pulse 95, temperature 97.9 F (36.6 C), temperature source Oral, resp. rate (!) 21, height 5' 2.75" (1.594 m), weight 222 lb (100.7 kg), SpO2 91 %. GENERAL- alert, co-operative, appears as stated age, not in any distress. HEENT- Atraumatic, normocephalic, PERRL, EOMI, oral mucosa appears moist, no oropharyngeal erythema or exudate CARDIAC- RRR, systolic murmur best heard at RUSB, no rubs or gallops. RESP- Moving equal volumes of air, and clear to auscultation bilaterally  with mild decrease in breath sounds to bil bases, no wheezes or crackles. ABDOMEN- Soft, nontender, bowel sounds present, no masses. NEURO- No obvious Cr N abnormality. Subjective left sided decreased sensation; strength intact EXTREMITIES- pulse 2+, symmetric, no pedal edema. SKIN- Warm, dry, no rash or lesion. PSYCH- Normal mood and affect, appropriate thought content and speech.  EKG: personally reviewed my interpretation is SR, no acute ST or TW changes compared to previous  CXR: personally reviewed my interpretation is Cardiomegaly, no pleural effusions, no infiltrates, no consolidation  Assessment & Plan by Problem: Active Problems:   Acute asthma exacerbation  Acute asthma exacerbation: Patient with s/s of acute on chronic allergic sinusitis not on chronic suppressive therapy with antihistamines and intranasal steroids which likely has exacerbated her asthma symptoms. On our evaluation in the ED following breathing treatments and steroids, she was able to be weaned to RA at rest and reported significant improvement in her breathing. --azithromycin, prednisone starting tomorrow --PRN  albuterol --trelegy --PEFs --claritin, flonase --Oxygen therapy to keep O2 sat >90% --if continued improvement tomorrow, will ambulate with pulse ox and consult CM for assistance with obtaining home O2 including portable oxygen  Constipation: Patient endorses significant constipation, bloating (chronic) which she states is making her dyspnea worse. She is supposed to get outpt CT abd for eval of bloating and requested we obtain this during her admission. --simethicone --miralax, senna --will not obtain CT scan while inpatient as not appropriate use of resources as no acute indication  HTN: Patient on nifedipine 90mg  daily, coreg 12.5mg  BID, diltiazem 120mg  daily. BP mildly elevated on admission. --Continue home regimen  Atrial flutter s/p cardioversion with flecainide: Currently SR. On xarelto for anticoagulation (CHA2DS2VASC score of 5) and coreg/diltiazem for rate control. --continue xarelto --continue coreg and diltiazem  HIV: Viral load undetectable, CD4 520 in Jan 2019. --continue Biktarvy  Dispo: Admit patient to Observation with expected length of stay less than 2 midnights.  Signed: Alphonzo Grieve, MD 11/02/2017, 8:25 PM  Pager [phone]

## 2017-11-02 NOTE — ED Notes (Signed)
Per pt request, placed socks on feet, and pants taken off. She is requesting to eat, but understands she will have to wait.

## 2017-11-03 ENCOUNTER — Telehealth (HOSPITAL_COMMUNITY): Payer: Self-pay

## 2017-11-03 ENCOUNTER — Other Ambulatory Visit: Payer: Self-pay

## 2017-11-03 DIAGNOSIS — I10 Essential (primary) hypertension: Secondary | ICD-10-CM

## 2017-11-03 DIAGNOSIS — Z9981 Dependence on supplemental oxygen: Secondary | ICD-10-CM | POA: Diagnosis not present

## 2017-11-03 DIAGNOSIS — Z888 Allergy status to other drugs, medicaments and biological substances status: Secondary | ICD-10-CM | POA: Diagnosis not present

## 2017-11-03 DIAGNOSIS — K5900 Constipation, unspecified: Secondary | ICD-10-CM

## 2017-11-03 DIAGNOSIS — B20 Human immunodeficiency virus [HIV] disease: Secondary | ICD-10-CM | POA: Diagnosis not present

## 2017-11-03 DIAGNOSIS — Z7901 Long term (current) use of anticoagulants: Secondary | ICD-10-CM

## 2017-11-03 DIAGNOSIS — K5909 Other constipation: Secondary | ICD-10-CM

## 2017-11-03 DIAGNOSIS — J45901 Unspecified asthma with (acute) exacerbation: Secondary | ICD-10-CM | POA: Diagnosis not present

## 2017-11-03 DIAGNOSIS — I4892 Unspecified atrial flutter: Secondary | ICD-10-CM | POA: Diagnosis not present

## 2017-11-03 DIAGNOSIS — G8929 Other chronic pain: Secondary | ICD-10-CM

## 2017-11-03 DIAGNOSIS — R109 Unspecified abdominal pain: Secondary | ICD-10-CM

## 2017-11-03 DIAGNOSIS — Z79899 Other long term (current) drug therapy: Secondary | ICD-10-CM | POA: Diagnosis not present

## 2017-11-03 DIAGNOSIS — J4541 Moderate persistent asthma with (acute) exacerbation: Secondary | ICD-10-CM | POA: Diagnosis not present

## 2017-11-03 DIAGNOSIS — E881 Lipodystrophy, not elsewhere classified: Secondary | ICD-10-CM

## 2017-11-03 LAB — BASIC METABOLIC PANEL
Anion gap: 11 (ref 5–15)
BUN: 22 mg/dL — ABNORMAL HIGH (ref 6–20)
CHLORIDE: 99 mmol/L — AB (ref 101–111)
CO2: 30 mmol/L (ref 22–32)
CREATININE: 1.27 mg/dL — AB (ref 0.44–1.00)
Calcium: 10.1 mg/dL (ref 8.9–10.3)
GFR calc non Af Amer: 44 mL/min — ABNORMAL LOW (ref >60)
GFR, EST AFRICAN AMERICAN: 51 mL/min — AB (ref >60)
Glucose, Bld: 206 mg/dL — ABNORMAL HIGH (ref 65–99)
POTASSIUM: 4.1 mmol/L (ref 3.5–5.1)
SODIUM: 140 mmol/L (ref 135–145)

## 2017-11-03 MED ORDER — SODIUM CHLORIDE 0.9 % IV BOLUS (SEPSIS)
250.0000 mL | Freq: Once | INTRAVENOUS | Status: AC
  Administered 2017-11-03: 250 mL via INTRAVENOUS

## 2017-11-03 MED ORDER — PREDNISONE 20 MG PO TABS: 40.0000 mg | ORAL_TABLET | Freq: Every day | ORAL | 0 refills | Status: DC

## 2017-11-03 MED ORDER — MAGNESIUM CITRATE PO SOLN
1.0000 | Freq: Once | ORAL | Status: DC
  Filled 2017-11-03: qty 296

## 2017-11-03 MED ORDER — ORAL CARE MOUTH RINSE
15.0000 mL | Freq: Two times a day (BID) | OROMUCOSAL | Status: DC
  Administered 2017-11-03: 15 mL via OROMUCOSAL

## 2017-11-03 MED ORDER — AZITHROMYCIN 250 MG PO TABS: 250.0000 mg | ORAL_TABLET | Freq: Every day | ORAL | 0 refills | Status: DC

## 2017-11-03 NOTE — Progress Notes (Addendum)
Patient states she does not feel comfortable discharging tonight and is requesting to speak to the MD and administration.  Internal med paged

## 2017-11-03 NOTE — Care Management Note (Addendum)
Case Management Note  Patient Details  Name: Shannon [name] MRN: [id number] Date of Birth: 05-17-1956  Subjective/Objective:    History of HIV, admitted for Acute asthma exacerbation, was transferred to the ED via EMS      Action/Plan: In to speak with patient. PTA patient lived at home alone.  Will be returning to the same living situation after discharge. Prior to admission patient states she is active with [facility] for DME: oxygen; having issues getting small portable tanks.  NCM outreach [place] spoke with [place] who reviewed patient's account and states patient has orders for nocturnal oxygen only and would require new order prior to discharge for continuous oxygen in order to receive additional oxygen.  Uses [facility] on [address] in [place].  Patient does not have transportation home at discharge.  Patient has the ability to pay for  Medications/food.  Home DME: oxygen, walker-2 wheels, bedside commode and nebulizer.  Patient does her own cooking using no salt.  Uses [facility] for medical appointments.  PCP noted. Patient also has pulmonologist.  Home address verified.  Patient inquired about oxygen tank for emergencies.  Call placed to [facility] with [facility] advised patient active with [facility] and is requesting an Emergency tank since patient states she does not have one at her residence if the power goes out at home; to be delivered prior to discharge.  Expected Discharge Date:    TBD              Expected Discharge Plan:  Home/Self Care  Discharge planning Services  CM Consult  Prior to admission DME Agency:   [facility]  Status of Service:  In process, will continue to follow  Kristen Cardinal, RN  Nurse case [place] 11/03/2017, 2:02 PM

## 2017-11-03 NOTE — Care Management CC44 (Signed)
Condition Code 44 Documentation Completed  Patient Details  Name: MELLINA BENISON MRN: [id number] Date of Birth: 29-Jul-1956   Condition Code 44 given:  Yes Patient signature on Condition Code 44 notice:  (Patient refused) Documentation of 2 MD's agreement:    Code 44 added to claim:  Yes    Kristen Cardinal, RN 11/03/2017, 5:00 PM

## 2017-11-03 NOTE — Discharge Summary (Signed)
Name: Shannon [name] MRN: [id number] DOB: [birth year] 62 y.o. PCP: Shannon Falcon, MD  Date of Admission: 11/02/2017 11:40 AM Date of Discharge: 11/04/2017 Attending Physician: Shannon Groves, DO  Discharge Diagnosis: 1. Acute asthma exacerbation  2. Constipation  3. HIV  Principal Problem:   Acute asthma exacerbation Active Problems:   Moderate persistent asthma with exacerbation   HIV disease ([place])   On home oxygen therapy   Constipation   Discharge Medications: Allergies as of 11/04/2017      Reactions   Tree Extract Swelling, Other (See Comments)   Swelling to eyes   Ciprofloxacin Hives      Medication List    STOP taking these medications   doxycycline 50 MG capsule Commonly known as:  VIBRAMYCIN     TAKE these medications   albuterol (2.5 MG/3ML) 0.083% nebulizer solution Commonly known as:  PROVENTIL Take 3 mLs (2.5 mg total) by nebulization every 6 (six) hours as needed for wheezing or shortness of breath.   PROAIR HFA 108 (90 Base) MCG/ACT inhaler Generic drug:  albuterol INHALE 2 PUFFS INTO THE LUNGS EVERY 4 HOURS AS NEEDED FOR WHEEZING OR SHORTNESS OF BREATH   azithromycin 250 MG tablet Commonly known as:  ZITHROMAX Take 1 tablet (250 mg total) by mouth daily.   bictegravir-emtricitabine-tenofovir AF 50-200-25 MG Tabs tablet Commonly known as:  BIKTARVY Take 1 tablet by mouth daily.   BIOTENE DRY MOUTH GENTLE Liqd Use as directed 1 Dose in the mouth or throat 2 (two) times daily.   BIOTIN PO Take 1 tablet by mouth daily.   carvedilol 12.5 MG tablet Commonly known as:  COREG Take 1 tablet (12.5 mg total) 2 (two) times daily with a meal by mouth.   cholecalciferol 1000 units tablet Commonly known as:  VITAMIN D Take 1,000 Units by mouth daily.   cyanocobalamin 1000 MCG/ML injection Commonly known as:  (VITAMIN B-12) ADMINISTER 1 ML(1000 MCG) IN THE MUSCLE EVERY 30 DAYS   diltiazem 120 MG 24 hr capsule Commonly known as:  CARDIZEM  CD Take 1 capsule (120 mg total) by mouth daily.   fluticasone 50 MCG/ACT nasal spray Commonly known as:  FLONASE Place 1 spray into both nostrils daily. What changed:    when to take this  reasons to take this   Fluticasone-Umeclidin-Vilant 100-62.5-25 MCG/INH Aepb Commonly known as:  TRELEGY ELLIPTA Inhale 1 puff into the lungs daily.   furosemide 20 MG tablet Commonly known as:  LASIX Take 1 tablet (20 mg total) daily by mouth. What changed:  additional instructions   loratadine 10 MG tablet Commonly known as:  CLARITIN Take 1 tablet (10 mg total) by mouth daily as needed for allergies.   losartan 100 MG tablet Commonly known as:  COZAAR Take 100 mg by mouth as needed (FOR BLOOD PRESSURE ABOVE 160).   multivitamin with minerals Tabs tablet Take 1 tablet by mouth daily.   NIFEdipine 90 MG 24 hr tablet Commonly known as:  ADALAT CC Take 1 tablet (90 mg total) by mouth daily.   OXYGEN Inhale 2.5-3 L into the lungs at bedtime as needed (Shortness of Breath). 2.5 to 3 liters at night and as needed in the daytime.   potassium chloride 10 MEQ tablet Commonly known as:  K-DUR Take 1 tablet (10 mEq total) daily by mouth. What changed:  when to take this   predniSONE 20 MG tablet Commonly known as:  DELTASONE Take 2 tablets (40 mg total) by mouth daily with  breakfast.   rivaroxaban 20 MG Tabs tablet Commonly known as:  XARELTO Take 1 tablet (20 mg total) by mouth daily with supper.   sodium chloride 0.65 % Soln nasal spray Commonly known as:  OCEAN Place 1 spray into both nostrils as needed for congestion.   VISINE OP Place 2 drops into both eyes daily as needed (for dry eyes).   vitamin C 1000 MG tablet Take 1,000 mg by mouth daily.       Disposition and follow-up:   Shannon [name] was discharged from [facility] in Stable condition.  At the hospital follow up visit please address:  1.  Please assess respiratory status and compliance  with home inhalers as well as anti-histamine and intranasal steroid therapy. Continue to educate patient on importance of compliance with these medications as well as follow up with outpatient pulmonology. Please assess ongoing constipation and bloating, and continue to encourage follow up with GI.   2.  Labs / imaging needed at time of follow-up: None  3.  Pending labs/ test needing follow-up: None  Follow-up Appointments: Follow-up Information    Shannon Latch, MD.   Specialty:  Cardiology Why:  Appointment date on  Contact information: [address] [place] [address] [phone]        Call Shannon Falcon, MD.   Specialty:  Internal Medicine Contact information: [place] [address] [place].   Contact information: [address] [place] [address] [place] Hospital Course by problem list: Principal Problem:   Acute asthma exacerbation Active Problems:   Moderate persistent asthma with exacerbation   HIV disease ([place])   On home oxygen therapy   Constipation   1. Acute asthma exacerbation: Patient has a history of asthma, seasonal allergies, and nocturnal hypoxia (on home O2 at night). She is compliant with home inhaler Trelegy, but not with Flonase or Claritin. She presented from pulmonologist office due to hypoxia at rest. On presentation, she reported a 2-week history of nasal congestion, rhinorrhea, HA, and productive cough. Her lung exam was benign. She was on 2L O2 when seen, but promptly weaned to room air. She was started on azithromycin and steroid therapy. However, she declined steroid therapy due to concern for worsening lipodystrophy. Her respiratory status remains stable without further episodes of hypoxia requiring daytime supplemental oxygen therapy. She underwent home O2 evaluation during this admission and did not desat below 91% on ambulation. She  was advised to continue to follow up with outpatient pulmonology. Medical team also stressed the importance of compliance with home anti-histamine and intranasal steroids as her chronic allergies seem to trigger her asthma exacerbations. Patient initially declined discharge home as she believed she was not ready to go home despite reporting improvement in respiratory status and not requiring supplemental oxygen. She requested a pulmonology consult which was not obtained given patient's stable condition. We continued to encourage her to follow up with pulmonology as an outpatient. She went home on day after discharge.   2. Constipation: Patient presented complaining of constipation and bloating (chronic). She follows up with GI and is scheduled to have a CT abdomen/pelvis for further evaluation. She was treated with Simethicone, Miralax, and Senokot and had 2 small bowel movements during this admission. She was encouraged to continue to follow up with GI as an outpatient.  3. HIV: Patient was continued on home medication Biktarvy.   Discharge Vitals:   BP (!) 153/83 (BP Location: Right Wrist)   Pulse 79   Temp 98.2 F (36.8 C) (Oral)   Resp 18   Ht 5\' 2"  (1.575 m)   Wt 222 lb (100.7 kg)   SpO2 90%   BMI 40.60 kg/m   Pertinent Labs, Studies, and Procedures:   CBC Latest Ref Rng & Units 11/02/2017 09/02/2017 08/25/2017  WBC 4.0 - 10.5 K/uL 5.3 5.2 6.8  Hemoglobin 12.0 - 15.0 g/dL 13.0 12.1 11.8(L)  Hematocrit 36.0 - 46.0 % 40.0 37.4 37.6  Platelets 150 - 400 K/uL 180 182 170    BMP Latest Ref Rng & Units 11/03/2017 11/02/2017 10/21/2017  Glucose 65 - 99 mg/dL 206(H) 113(H) -  BUN 6 - 20 mg/dL 22(H) 14 14  Creatinine 0.44 - 1.00 mg/dL 1.27(H) 1.19(H) 1.20  BUN/Creat Ratio 6 - 22 (calc) - - -  Sodium 135 - 145 mmol/L 140 141 -  Potassium 3.5 - 5.1 mmol/L 4.1 4.3 -  Chloride 101 - 111 mmol/L 99(L) 101 -  CO2 22 - 32 mmol/L 30 30 -  Calcium 8.9 - 10.3 mg/dL 10.1 10.0 -   CXR 3/12:  FINDINGS: Stable cardiomegaly. No pneumothorax or pleural effusion is noted. Both lungs are clear. The visualized skeletal structures are Unremarkable.  Discharge Instructions: Discharge Instructions    Call MD for:  difficulty breathing, headache or visual disturbances   Complete by:  As directed    Call MD for:  extreme fatigue   Complete by:  As directed    Call MD for:  temperature >100.4   Complete by:  As directed    Diet - low sodium heart healthy   Complete by:  As directed    Discharge instructions   Complete by:  As directed    Ms. Cumber,   You were admitted to the hospital due to an asthma exacerbation. To minimize the risk of having another in the future it will be very important that you use your inhalers at home as you were instructed. It will also be very important that you use Flonase and Claritin every day, even if you feel like your sinuses feel ok.   Increase activity slowly   Complete by:  As directed       Signed: Welford Roche, MD 11/07/2017, 12:19 AM   Pager: [phone]

## 2017-11-03 NOTE — Care Management Obs Status (Signed)
[place] NOTIFICATION   Patient Details  Name: Shannon [name] MRN: [id number] Date of Birth: 02-Nov-1955   Medicare Observation Status Notification Given:  Yes    Kristen Cardinal, RN 11/03/2017, 5:00 PM

## 2017-11-03 NOTE — Telephone Encounter (Signed)
Patient called and spoke with Thayer Headings and stated she is currently admitted in hospital right now. She would like to reschedule orientation for Pulmonary Rehab. Passed to RN for further review.

## 2017-11-03 NOTE — Progress Notes (Signed)
SATURATION QUALIFICATIONS: (This note is used to comply with regulatory documentation for home oxygen)  Patient Saturations on Room Air at Rest = 96%  Patient Saturations on Room Air while Ambulating = 91%  Patient Saturations on n/a Liters of oxygen while Ambulating = n/a%  Please briefly explain why patient needs home oxygen:

## 2017-11-03 NOTE — Progress Notes (Signed)
MD Frederico Hamman is aware patient is not discharging tonight and patient is aware she has a discharge order.

## 2017-11-03 NOTE — Progress Notes (Signed)
Subjective:  No acute events overnight. Patient reports she is not doing well this morning and her breathing has no improved. She states multiple times her lipodystrophy is causing her abdomen to go into her chest and causing her shortness of breath. She also mentions she is from [place] where there are not many allergens and that there are many trees here in [place] that are causing her terrible allergies. She appears very distressed by this. She also complains of chronic, R-sided abdominal pain for which she was referred to GI. [name] has ordered a CT abdomen/pelvis for further evaluation that is scheduled for tomorrow and patient requesting to have this done while she is admitted. Discussed with patient importance of compliance with home asthma inhalers as well as Flonase and Claritin for allergies. Also discussed case management will help her sort out issued obtaining oxygen for use during the day. She was advised to continue to follow up with GI for her abdominal pain and with Pulmonology for asthma. Discussed her CT has been scheduled and approved for tomorrow and it is not indicated for admission for asthma exacerbation. Patient verbalized understanding. All questions answered.   Objective:  Vital signs in last 24 hours: Vitals:   11/02/17 2305 11/03/17 0514 11/03/17 0815 11/03/17 0854  BP: 140/85 (!) 155/69 (!) 149/87   Pulse: (!) 104 85 77   Resp: 20 18 18    Temp: 98.8 F (37.1 C) 98.3 F (36.8 C) (!) 97.5 F (36.4 C)   TempSrc: Oral Oral Oral   SpO2: 93% 95% 97% 98%  Weight:      Height: 5\' 2"  (1.575 m)      Physical Exam  Constitutional: She is oriented to person, place, and time.  Obese female, sitting up in bed eating breakfast. She appears anxious and at times upset due to chronic illnesses, but is in no acute distress.   Cardiovascular: Normal rate, regular rhythm and normal heart sounds.  Pulmonary/Chest:  Patient is breathing comfortably on room air and is able to speak  in full sentences. No wheezes or crackles noted on exam.   Abdominal:  Abdomen is obese, but soft and non-distended. She grimaces during palpation of RLQ and R lateral abdomen. She has normoactive bowel sounds.   Musculoskeletal: She exhibits no edema.  Neurological: She is alert and oriented to person, place, and time.    Assessment/Plan:  Principal Problem:   Acute asthma exacerbation Active Problems:   HIV disease ([place])   On home oxygen therapy   Constipation  # Acute asthma exacerbation: Patient presented with signs/symptoms acute on chronic allergic sinusitis not on chronic suppressive therapy with antihistamines and intranasal steroids which likely has exacerbated her asthma symptoms. She continues to endorse dyspnea that she believes is due to stomach being pushed into chest secondary to lipodystrophy. This morning she appeared comfortable on room air and her lung exam was unremarkable. We discussed importance of compliance with home inhalers as well as with Flonase and Claritin for severe allergies. She will be discharged today after she completes home O2 evaluation. - Continue azithromycin and prednisone  - PRN albuterol - Continue home Trelegy - Educated patient on importance of daily compliance with Flonase and Claritin  - Oxygen therapy to keep O2 sat >90% - Will ambulate patient today to determine oxygen needs. Consult CM for assistance with obtaining home O2 including portable oxygen  # Constipation: Patient endorses significant constipation, bloating (chronic) which she states is making her dyspnea worse. She is supposed  to get outpt CT abd for eval of bloating and requested we obtain this during her admission. Patient explained this is not appropriate as she was admitted for acute asthma exacerbation for which an abdominal/pelvis CT is not indicated. She was advise to return to [facility] tomorrow for this.  - Simethicone - Miralax, senna - Mag citrate   # HTN: Patient on  nifedipine 90mg  daily, coreg 12.5mg  BID, diltiazem 120mg  daily. - Continue home regimen  # Atrial flutter s/p cardioversion with flecainide: Currently SR. On xarelto for anticoagulation (CHA2DS2VASC score of 5) and coreg/diltiazem for rate control. - Continue xarelto - Continue Coreg and Diltiazem  # HIV: Viral load undetectable, CD4 520 in Jan 2019. - Continue Biktarvy  Dispo: Anticipated discharge today.   Welford Roche, MD 11/03/2017, 2:52 PM Pager: [phone]

## 2017-11-04 ENCOUNTER — Telehealth: Payer: Self-pay | Admitting: Pulmonary Disease

## 2017-11-04 ENCOUNTER — Telehealth (HOSPITAL_COMMUNITY): Payer: Self-pay

## 2017-11-04 ENCOUNTER — Inpatient Hospital Stay: Admission: RE | Admit: 2017-11-04 | Payer: Medicare Other | Source: Ambulatory Visit

## 2017-11-04 DIAGNOSIS — J4541 Moderate persistent asthma with (acute) exacerbation: Secondary | ICD-10-CM | POA: Diagnosis not present

## 2017-11-04 DIAGNOSIS — E881 Lipodystrophy, not elsewhere classified: Secondary | ICD-10-CM | POA: Diagnosis not present

## 2017-11-04 DIAGNOSIS — K5909 Other constipation: Secondary | ICD-10-CM | POA: Diagnosis not present

## 2017-11-04 DIAGNOSIS — Z888 Allergy status to other drugs, medicaments and biological substances status: Secondary | ICD-10-CM

## 2017-11-04 DIAGNOSIS — J45901 Unspecified asthma with (acute) exacerbation: Secondary | ICD-10-CM | POA: Diagnosis not present

## 2017-11-04 DIAGNOSIS — Z91048 Other nonmedicinal substance allergy status: Secondary | ICD-10-CM

## 2017-11-04 DIAGNOSIS — I10 Essential (primary) hypertension: Secondary | ICD-10-CM | POA: Diagnosis not present

## 2017-11-04 DIAGNOSIS — I4892 Unspecified atrial flutter: Secondary | ICD-10-CM | POA: Diagnosis not present

## 2017-11-04 NOTE — Progress Notes (Signed)
Late Entry  AVS reviewed, cab voucher given, patient discharged to home

## 2017-11-04 NOTE — Clinical Social Work Note (Signed)
Patient medically stable for discharge home today. Patient has no one that can assist her with transport home and has an oxygen tank. Ms. Dobies will be transported home via cab as she is ambulatory and ambulance transport not appropriate. CSW signing off as cab voucher given to nurse.  Lorrain Rivers [place]ens, MSW, LCSW Licensed Clinical Social Worker [place] [phone]

## 2017-11-04 NOTE — Progress Notes (Signed)
Patient wishes to dispute discharge.  Spoke with MD, they will see her this morning. Waiting for case management.

## 2017-11-04 NOTE — Telephone Encounter (Signed)
Spoke with patient again. She called back upset stating that she is being discharged today but she does not feel well enough to go home. She wants to continue to stay in the hospital. She wanted to come back into the office today as an emergency visit, advised patient not to do that. Advised patient once again that she needs to communicate with the hospital staff that she thinks she is not well enough to go home. She stated that it is not her job to do so. Advised her that we are an outpatient clinic and that we have no say so over what goes on in the inpatient settings.   I have sent RA a text asking if he could please have someone from pulmonary to round on her. She has already called the office twice and was extremely rude to the front desk staff as well as to [place], who handled her call earlier this morning.   Will await RA's response since he is working at the hospital this morning.

## 2017-11-04 NOTE — Telephone Encounter (Signed)
She has to ask her supervising physician to call pulmonary for a consult. I am rounding at [facility] this week

## 2017-11-04 NOTE — Telephone Encounter (Signed)
Spoke with pt. States that she has admitted for an asthma exacerbation. She was discharged yesterday and refused to leave. Reports that she is not well enough to go home. Pt is wanting Dr. Elsworth Soho or Tammy to call over to the hospital on her behalf. Advised her that she needs to speak to the nurse or MD who is taking care of her and see if the rounding pulmonary doctor could come see her. She agreed and verbalized understanding. Nothing further was needed.

## 2017-11-04 NOTE — Progress Notes (Signed)
Subjective:  Patient was discharged yesterday and refused to leave. She stated she was dehydrated and that it was nto safe for her to go home because she has to go up the stair to get to her apartment.This morning, patient reports she is feeling better but continues to state she is not ready to go home. Once again explained to patient there is no indication for her to remain in the hospital at this time. Also explained her oxygen levels did not decrease when she was walked yesterday and she therefore does not qualify for oxygen during the day. She was advised to continue to follow up with her pulmonary doctor. She called her pulmonologist's office and asked to be seen prior to rounds.  However, per Dr. Bari Mantis note patient to be seeing without primary team consult, which was not deem necessary based on patient's clinical status.    Objective:  Vital signs in last 24 hours: Vitals:   11/03/17 1917 11/03/17 2023 11/03/17 2056 11/04/17 0528  BP: (!) 157/89 (!) 138/92  130/85  Pulse: (!) 109 72  69  Resp: 20 (!) 22  (!) 22  Temp: 98.1 F (36.7 C) 97.7 F (36.5 C)  98.1 F (36.7 C)  TempSrc: Oral Oral  Oral  SpO2: 93% 91% 95% 93%  Weight:      Height:       Physical Exam  Constitutional: She is oriented to person, place, and time.  Obese female sitting up in bed in no acute distress   Cardiovascular: Normal rate, regular rhythm and normal heart sounds. Exam reveals no gallop and no friction rub.  No murmur heard. Pulmonary/Chest: Effort normal and breath sounds normal. No respiratory distress. She has no wheezes. She has no rales.  Abdominal: Soft. Bowel sounds are normal. She exhibits no distension. There is no tenderness.  Musculoskeletal: Normal range of motion. She exhibits no edema.  Neurological: She is alert and oriented to person, place, and time.    Assessment/Plan:  Principal Problem:   Acute asthma exacerbation Active Problems:   Moderate persistent asthma with  exacerbation   HIV disease ([place])   On home oxygen therapy   Constipation  # Acute asthma exacerbation: Patient respiratory status remains stable today and she reports improvement in her breathing, but she continues to report she is not ready to go home. She appears comfortable on room air and her lung exam was unremarkable. Home O2 evaluation did not show desat during exertion. Explained to patient she did now qualify for daytime oxygen and why it is not appropriate to prescribe supplemental oxygen. Also explained there is no medical indication for admission at this time. She was advised to continue to follow up with pulmonology as an outpatient.  - Continue azithromycin and prednisone  - PRN albuterol - Continue home Trelegy - Educated patient on importance of daily compliance with Flonase and Claritin  - Oxygen therapy to keep O2 sat >90%  # Constipation: Patient a small bowel movement last night and this morning. She declined mag citrate yesterday. She was advised to continue her home laaxtive regimen.  - Simethicone - Miralax, senna - Mag citrate   # HTN: Patient on nifedipine 90mg  daily, coreg 12.5mg  BID, diltiazem 120mg  daily. - Continue home regimen  # Atrial flutter s/p cardioversion with flecainide: Currently SR. On xarelto for anticoagulation (CHA2DS2VASC score of 5) and coreg/diltiazem for rate control. - Continue xarelto - Continue Coreg and Diltiazem  # HIV: Viral load undetectable, CD4 520 in Jan  2019. - Continue Biktarvy  Dispo: Patient was discharged yesterday, 3/13.   Welford Roche, MD 11/04/2017, 6:30 AM Pager: [phone]

## 2017-11-04 NOTE — Telephone Encounter (Signed)
Called patient to reschedule orientation - Scheduled for April 15th at 12:00pm. Patient will attend the 1:30pm exc class. Patient has received the packet, she will bring with her to orientation.

## 2017-11-04 NOTE — Progress Notes (Addendum)
Patient received discharge orders yesterday 11/03/2017 and refused to leave. In to speak with patient, NCM advised patient that she was discharged yesterday and is no longer considered to be a patient in the facility; and that we need her to tell me her plan for discharge today. Patient verbalized understanding. She states "just talked to Dr. Heber Miles, also called [facility].  [facility] is sending one of their physicians to see if she needs to be discharged and she needs to wait for them to arrive". NCM offered to allow patient to sit in patient conference room if she would like and speak with the Pulmonologist when they arrived.   NCM encouraged patient to schedule follow-up asap with Pulmonologist today.  Patient states she will call the Pulmonologist now to schedule a follow up appointment.  At discharge, patient has no transportation home.  Discussed transportation need with LCSW and patient will be transported via taxi.   Kristen Cardinal, BSN, RN Nurse Case [facility]

## 2017-11-04 NOTE — Telephone Encounter (Signed)
Called and spoke with patient advised her of RA response, she has asked to make a follow up hospital visit. Patient is scheduled to see RA on 3.18.19 at 9:30 nothing further needed.

## 2017-11-04 NOTE — Progress Notes (Signed)
  Spoke with Shannon [name] from Internal Medicine this evening. She was informed that patient stated she was dehydrated and would not leave. She stated she was aware of the situation. She stated that the Dr. Arletha Pili her earlier today and did not think it was necessary for the Dr. To come back to see her. Patient continues to be in her room at this time. She says that she did not agree to a discharge.

## 2017-11-04 NOTE — Progress Notes (Signed)
Patients discharge is complete. Patient wants to take medication before she leaves and refuses to take medications until she has lunch. Patient has lunch tray.

## 2017-11-05 ENCOUNTER — Other Ambulatory Visit: Payer: Self-pay | Admitting: Licensed Clinical Social Worker

## 2017-11-05 ENCOUNTER — Other Ambulatory Visit: Payer: Self-pay

## 2017-11-05 ENCOUNTER — Ambulatory Visit (HOSPITAL_COMMUNITY): Payer: Medicare Other

## 2017-11-05 NOTE — Patient Outreach (Signed)
[place] [facility]) Care Management  11/05/2017  Shannon [name] 1955-12-21 [id number]  Assessment-CSW completed intial outreach attempt today after receiving new referral for transportation assistance. CSW unable to reach patient successfully and unable to leave a voice message as phone line continuously rang.   Plan-CSW will await return call or complete second outreach attempt on 11/08/17.  Eula Fried, BSW, MSW, [email or web address] Phone: [phone] Fax: [phone]

## 2017-11-08 ENCOUNTER — Other Ambulatory Visit: Payer: Self-pay | Admitting: *Deleted

## 2017-11-08 ENCOUNTER — Other Ambulatory Visit: Payer: Self-pay | Admitting: Licensed Clinical Social Worker

## 2017-11-08 ENCOUNTER — Ambulatory Visit (INDEPENDENT_AMBULATORY_CARE_PROVIDER_SITE_OTHER): Payer: Self-pay | Admitting: Family Medicine

## 2017-11-08 ENCOUNTER — Inpatient Hospital Stay: Payer: Medicare Other | Admitting: Pulmonary Disease

## 2017-11-08 NOTE — Patient Outreach (Signed)
[facility]) Care Management  11/08/2017  Shannon [name] March 31, 1956 [id number]   Referral received from hospital liaison as member was recently admitted to hospital for complications of asthma.  Per chart, she also has history of hypertension, CAD, atrial flutter, COPD, GERD, and chronic kidney disease.  Call placed to member to initiate transition of care.  She report she is on the phone attempting to schedule MD appointments, request to have this care manager call back.  Will follow up tomorrow.  Valente David, [place], MSN [place] [phone]

## 2017-11-08 NOTE — Patient Outreach (Addendum)
Shannon [name] [name]) Care Management  11/08/2017  SAE Shannon [name] Oct 13, 1955 [id number]  Assessment- CSW received return call from patient on 11/08/17. Patient provided two HIPPA verifications successfully. CSW introduced self, reason for call and of [facility] social work services. Patient reports that she would prefer that Shannon [name] contact her on her mobile number as she has not been able to get to house phone in time as it is downstairs. CSW will relay this request to Shannon [name]. CSW provided education on available transportation assistance resources within the area. Patient confirms that she already has SCAT services in place but at times cannot afford the $1.50 amount ride to medical appointments. Patient has Medicaid transportation benefits as well and CSW provided education on this benefit to patient. Patient reports that she has had a difficult time reaching Shannon [name] to arrange transportation. CSW encouraged patient to contact her Medicaid caseworker to discuss these concerns. CSW educated patient on Shannon [name] which is a free transportation services for [employer]. Patient agreeable to Shannon [name] making referral. Patient expressed understanding that Shannon [name] is a free transportation service but that patient needs to contact program 7 days in advance to make transportation arrangements. CSW informed patient that this is only for medical appointments. Patient will receive a letter in the mail from Shannon [name] and all patient will need to do is sign two consent forms, place in envelope that will be provided (as well as a stamp) and mail back to Shannon [name]. CSW successfully completed referral to Shannon [name] for patient. Patient will have three sources of stable transportation once she completes paperwork. CSW appreciative of transportation assistance and denies any further transportation needs. Patient shares that she has had several health conditions and looks  forward to working with Shannon [name] RNCM to help address her concerns. Patient denies have any need for Shannon [name] involvement stating that she was able to afford all of her prescribed medications and has already picked them up without issues. Patient lives alone and reports being able to prepare her own meals. Patient admits to having some issues with doing her laundry but denies needing an aide through Shannon [name] at this time. Patient does admit to having some financial issues. She reports having difficulty affording electric bill last month and went to Shannon [name] to help gain financial assistance. She shares that Shannon [name] paid for $215.00 on the $300 bill. Patient educated on other financial assistance programs within the area. Patient shares that she receives $115.00 in food stamps per month and denies needing food assistance resources. Patient is agreeable to CSW mailing patient out Development worker, international aid. CSW completed review of resources over the phone. CSW will not open case at this time due to no further social work needs but CSW will reach out to patient within two weeks to make sure financial resources were received and that Shannon [name] referral process was completed.  Plan-CSW will update Shannon [name] RNCM that patient wishes to be contacted by mobile number instead of home number. CSW will mail out financial resources to patient at this time and will follow up within two weeks to make sure documents were received.   Shannon [name], BSW, MSW, [name] Phone: [phone] Fax: [phone]

## 2017-11-08 NOTE — Patient Outreach (Signed)
[facility]) Care Management  11/08/2017  VASSIE KUGEL 1955-12-05 [id number]  Assessment-CSW completed second outreach attempt today. CSW unable to reach patient successfully or leave a voice message as phone line continuously rang. CSW will send unsuccessful outreach barrier letter at this time and await 10 business days to hear back from patient before completing third and final outreach attempt before closing case.   Plan-CSW will mail unsuccessful outreach letter to patient at this time.  Eula Fried, BSW, MSW, [email or web address] Phone: [phone] Fax: [phone]

## 2017-11-09 ENCOUNTER — Other Ambulatory Visit: Payer: Self-pay | Admitting: *Deleted

## 2017-11-09 NOTE — Patient Outreach (Signed)
[place] [facility]) Care Management  11/09/2017  VIVA GALLAHER [birth year] [id number]   Call placed back to member to initiate transition of care as she requested yesterday, no answer.  HIPAA compliant voice message left, will await call back.  If no call back, will follow up tomorrow.  Valente David, [place], MSN [place] [phone]

## 2017-11-10 ENCOUNTER — Other Ambulatory Visit: Payer: Self-pay | Admitting: *Deleted

## 2017-11-10 DIAGNOSIS — J189 Pneumonia, unspecified organism: Secondary | ICD-10-CM | POA: Diagnosis not present

## 2017-11-10 DIAGNOSIS — G4733 Obstructive sleep apnea (adult) (pediatric): Secondary | ICD-10-CM | POA: Diagnosis not present

## 2017-11-10 NOTE — Patient Outreach (Signed)
[place] [facility]) Care Management  11/10/2017  Shannon [name] [birth year] [id number]   Call received back from member, identity verified.  This care manager introduces self and purpose of call.  [facility] care management services explained, member agrees to involvement.  She report she has been trying to "recover" over the past several days.  State she is not 100% but is much better than when she was discharged.  She expresses frustration about being discharged, stating she does not feel she was ready to go home and was still having shortness of breath.  She continues to have intermittent shortness of breath now, and is no oxygen at 2 liters.    She state her pulmonologist thing her symptoms may be related to GI problems, has appointment with them on 3/21.  Follow up with pulmonology on 3/22, with Infectious Disease on 3/26 to monitor HIV, and with primary MD on 3/27.  She agrees to home visit next week, denies any urgent concerns at this time.  Advised to contact this care manager with questions, provided with contact information.  [facility] CM Care Plan Problem One     Most Recent Value  Care Plan Problem One  Risk for readmission/increased ED visits related to asthma as evidenced by recent hospitalization  Role Documenting the Problem One  Care Management [place] for Problem One  Active  [facility] Long Term Goal   Member will not be readmitted to hospital or have any ED visits for asthma within the next 31 days  [facility] Long Term Goal Start Date  11/10/17  Interventions for Problem One Long Term Goal  Discussed with member the importance of following discharge instructions, including follow up appointments, medications, and diet, to decrease the risk of readmission  [facility] CM Short Term Goal #1   Member will keep and attend appointments with all MDs within the next week   [facility] CM Short Term Goal #1 Start Date  11/10/17  Interventions for Short Term Goal #1  Confirmed member has appointments  scheduled with multiple MDs to manage chronic health conditions (primary, GI, Pulmonologist, & infectious disease).  Confirmed member has transportation and has been in contact with LCSW for resources  [facility] CM Short Term Goal #2   Member will report compliance with medications as instructed over the next 4 weeks  [facility] CM Short Term Goal #2 Start Date  11/10/17  Interventions for Short Term Goal #2  Medications reviewed telephonically in comparison to discharge list.  Educated on importance of taking as prescribed     Valente David, Therapist, sports, MSN [place] Manager [phone]

## 2017-11-11 ENCOUNTER — Ambulatory Visit: Payer: Medicare Other | Admitting: Internal Medicine

## 2017-11-12 ENCOUNTER — Inpatient Hospital Stay: Payer: Medicare Other | Admitting: Adult Health

## 2017-11-15 ENCOUNTER — Ambulatory Visit: Payer: Medicare Other | Admitting: Internal Medicine

## 2017-11-15 ENCOUNTER — Other Ambulatory Visit: Payer: Self-pay | Admitting: Licensed Clinical Social Worker

## 2017-11-15 DIAGNOSIS — M67432 Ganglion, left wrist: Secondary | ICD-10-CM | POA: Diagnosis not present

## 2017-11-15 DIAGNOSIS — M67431 Ganglion, right wrist: Secondary | ICD-10-CM | POA: Diagnosis not present

## 2017-11-15 NOTE — Patient Outreach (Signed)
[place] [facility]) Care Management  11/15/2017  ERUM CERCONE Nov 17, 1955 [id number]  Assessment- CSW completed outreach call to patient to see if [employer] packet was received in the mail yet as well as financial resources that [place] mailed. CSW unable to reach patient successfully. HIPPA compliant voice message left encouraging a return call with updates.  Plan-CSW will await for return call or complete an additional outreach attempt within one week.  Eula Fried, BSW, MSW, [email or web address] Phone: [phone] Fax: [phone]

## 2017-11-16 ENCOUNTER — Ambulatory Visit: Payer: Medicare Other | Admitting: Internal Medicine

## 2017-11-17 ENCOUNTER — Other Ambulatory Visit: Payer: Self-pay | Admitting: *Deleted

## 2017-11-17 ENCOUNTER — Ambulatory Visit: Payer: Medicare Other | Admitting: Internal Medicine

## 2017-11-17 ENCOUNTER — Encounter: Payer: Self-pay | Admitting: Internal Medicine

## 2017-11-17 NOTE — Patient Outreach (Signed)
[facility]) Care Management   11/17/2017  KAYLIANA CODD 13-Jul-1956 [id number]  DYNESHA WOOLEN is an 62 y.o. female  Subjective:   Member alert and oriented x3, denies complaints of shortness of breath or pain today.  Objective:   Review of Systems  Constitutional: Negative.   HENT: Negative.   Eyes: Negative.   Respiratory: Negative.   Cardiovascular: Negative.   Gastrointestinal: Negative.   Genitourinary: Negative.   Musculoskeletal: Negative.   Skin: Negative.   Neurological: Negative.   Endo/Heme/Allergies: Negative.   Psychiatric/Behavioral: Negative.     Physical Exam  Constitutional: She is oriented to person, place, and time. She appears well-developed and well-nourished.  Neck: Normal range of motion.  Cardiovascular: Normal rate, regular rhythm and normal heart sounds.  Respiratory: Effort normal and breath sounds normal.  GI: Soft. Bowel sounds are normal.  Musculoskeletal: Normal range of motion.  Neurological: She is alert and oriented to person, place, and time.  Skin: Skin is warm and dry.   BP (!) 138/92 (BP Location: Right Arm, Patient Position: Sitting, Cuff Size: Normal)   Pulse 85   Resp 20   Ht 1.6 m (_0 )   Wt 222 lb (100.7 kg)   SpO2 94%   BMI 39.33 kg/m   Encounter Medications:   Outpatient Encounter Medications as of 11/17/2017  Medication Sig  . albuterol (PROVENTIL) (2.5 MG/3ML) 0.083% nebulizer solution Take 3 mLs (2.5 mg total) by nebulization every 6 (six) hours as needed for wheezing or shortness of breath.  . Ascorbic Acid (VITAMIN C) 1000 MG tablet Take 1,000 mg by mouth daily.  . bictegravir-emtricitabine-tenofovir AF (BIKTARVY) 50-200-25 MG TABS tablet Take 1 tablet by mouth daily.  [name] BIOTIN PO Take 1 tablet by mouth daily.  . carvedilol (COREG) 12.5 MG tablet Take 1 tablet (12.5 mg total) 2 (two) times daily with a meal by mouth.  . cholecalciferol (VITAMIN D) 1000 units tablet Take 1,000 Units by mouth  daily.  . cyanocobalamin (,VITAMIN B-12,) 1000 MCG/ML injection ADMINISTER 1 ML(1000 MCG) IN THE MUSCLE EVERY 30 DAYS  . fluticasone (FLONASE) 50 MCG/ACT nasal spray Place 1 spray into both nostrils daily. (Patient taking differently: Place 1 spray into both nostrils daily as needed for allergies or rhinitis. )  . losartan (COZAAR) 100 MG tablet Take 100 mg by mouth as needed (FOR BLOOD PRESSURE ABOVE 160).  . Mouthwashes (BIOTENE DRY MOUTH GENTLE) LIQD Use as directed 1 Dose in the mouth or throat 2 (two) times daily.  . Multiple Vitamin (MULTIVITAMIN WITH MINERALS) TABS tablet Take 1 tablet by mouth daily.  [name] NIFEdipine (ADALAT CC) 90 MG 24 hr tablet Take 1 tablet (90 mg total) by mouth daily.  . OXYGEN Inhale 2.5-3 L into the lungs at bedtime as needed (Shortness of Breath). 2.5 to 3 liters at night and as needed in the daytime.   [name] PROAIR HFA 108 (90 Base) MCG/ACT inhaler INHALE 2 PUFFS INTO THE LUNGS EVERY 4 HOURS AS NEEDED FOR WHEEZING OR SHORTNESS OF BREATH  . rivaroxaban (XARELTO) 20 MG TABS tablet Take 1 tablet (20 mg total) by mouth daily with supper.  . sodium chloride (OCEAN) 0.65 % SOLN nasal spray Place 1 spray into both nostrils as needed for congestion.  . Tetrahydrozoline HCl (VISINE OP) Place 2 drops into both eyes daily as needed (for dry eyes).   [name] azithromycin (ZITHROMAX) 250 MG tablet Take 1 tablet (250 mg total) by mouth daily. (Patient not taking: Reported on 11/17/2017)  .  diltiazem (CARDIZEM CD) 120 MG 24 hr capsule Take 1 capsule (120 mg total) by mouth daily.  . Fluticasone-Umeclidin-Vilant (TRELEGY ELLIPTA) 100-62.5-25 MCG/INH AEPB Inhale 1 puff into the lungs daily. (Patient not taking: Reported on 11/17/2017)  . furosemide (LASIX) 20 MG tablet Take 1 tablet (20 mg total) daily by mouth. (Patient taking differently: Take 20 mg by mouth daily. Patient take differently (when she needs to).)  . loratadine (CLARITIN) 10 MG tablet Take 1 tablet (10 mg total) by mouth daily as  needed for allergies.  . potassium chloride (K-DUR) 10 MEQ tablet Take 1 tablet (10 mEq total) daily by mouth. (Patient taking differently: Take 10 mEq by mouth every other day. )  . predniSONE (DELTASONE) 20 MG tablet Take 2 tablets (40 mg total) by mouth daily with breakfast. (Patient not taking: Reported on 11/17/2017)   No facility-administered encounter medications on file as of 11/17/2017.     Functional Status:   In your present state of health, do you have any difficulty performing the following activities: 11/02/2017 09/28/2017  Hearing? N N  Vision? N N  Difficulty concentrating or making decisions? N N  Walking or climbing stairs? N N  Dressing or bathing? N -  Doing errands, shopping? N N  Some recent data might be hidden    Fall/Depression Screening:    Fall Risk  11/17/2017 09/28/2017 09/02/2017  Falls in the past year? Yes No Yes  Number falls in past yr: 1 - 1  Injury with Fall? No - Yes  Risk Factor Category  - - -  Risk for fall due to : History of fall(s) - -  Follow up Education provided;Falls prevention discussed - Falls prevention discussed   PHQ 2/9 Scores 11/17/2017 09/28/2017 09/02/2017 07/21/2017 05/28/2017 05/21/2017 05/12/2017  PHQ - 2 Score 1 0 0 0 0 0 0  PHQ- 9 Score - - - - - - -    Assessment:    Met with member at scheduled time.  Assessment complete, no apparent distress noted.  She speaks a lot about being proud of how she has managed her HIV for so long.  Expresses frustration regarding new allergy to trees, but state she is managing well.    Provided with [facility] calendar tool book, advised to 24 hour nurse triage line.  She denies any urgent concerns, provided with contact information, advised to contact with questions.  Plan:   Will follow up with member telephonically next week.   Will follow up with routine home visit next month.  THN CM Care Plan Problem One     Most Recent Value  Care Plan Problem One  Risk for readmission/increased ED visits  related to asthma as evidenced by recent hospitalization  Role Documenting the Problem One  Care Management [place] for Problem One  Active  [facility] Long Term Goal   Member will not be readmitted to hospital or have any ED visits for asthma within the next 31 days  THN Long Term Goal Start Date  11/10/17  Interventions for Problem One Long Term Goal  Member educated on complications of asthma and other co-morbidities when they are not effectively managed.  THN CM Short Term Goal #1   Member will keep and attend appointments with all MDs within the next week   [facility] CM Short Term Goal #1 Start Date  11/10/17  Interventions for Short Term Goal #1  All appointments were canceled and rescheduled.  Advised again of the importance of follow  up with MD's in effort to decrease risk of readmission.  THN CM Short Term Goal #2   Member will report compliance with medications as instructed over the next 4 weeks  THN CM Short Term Goal #2 Start Date  11/10/17  Interventions for Short Term Goal #2  Medications reviewed in the home.  Referral placed to pharmacy for review and assistance with management.     Valente David, [place], MSN [place] [phone]

## 2017-11-19 ENCOUNTER — Other Ambulatory Visit: Payer: Self-pay | Admitting: Internal Medicine

## 2017-11-19 ENCOUNTER — Other Ambulatory Visit: Payer: Self-pay | Admitting: Cardiovascular Disease

## 2017-11-19 DIAGNOSIS — J4540 Moderate persistent asthma, uncomplicated: Secondary | ICD-10-CM

## 2017-11-19 DIAGNOSIS — E538 Deficiency of other specified B group vitamins: Secondary | ICD-10-CM

## 2017-11-20 ENCOUNTER — Encounter: Payer: Self-pay | Admitting: *Deleted

## 2017-11-22 ENCOUNTER — Ambulatory Visit (INDEPENDENT_AMBULATORY_CARE_PROVIDER_SITE_OTHER)
Admission: RE | Admit: 2017-11-22 | Discharge: 2017-11-22 | Disposition: A | Discharge status: Home / Self Care | Payer: Medicare Other | Source: Ambulatory Visit | Attending: Internal Medicine | Admitting: Internal Medicine

## 2017-11-22 ENCOUNTER — Other Ambulatory Visit: Payer: Self-pay | Admitting: Licensed Clinical Social Worker

## 2017-11-22 DIAGNOSIS — R10816 Epigastric abdominal tenderness: Secondary | ICD-10-CM

## 2017-11-22 DIAGNOSIS — R1032 Left lower quadrant pain: Secondary | ICD-10-CM

## 2017-11-22 DIAGNOSIS — R109 Unspecified abdominal pain: Secondary | ICD-10-CM | POA: Diagnosis not present

## 2017-11-22 DIAGNOSIS — R1031 Right lower quadrant pain: Secondary | ICD-10-CM

## 2017-11-22 MED ORDER — IOPAMIDOL (ISOVUE-300) INJECTION 61%
100.0000 mL | Freq: Once | INTRAVENOUS | Status: AC | PRN
  Administered 2017-11-22: 75 mL via INTRAVENOUS

## 2017-11-22 NOTE — Progress Notes (Signed)
CT shows old benign kidney cysts and stable atherosclerosis. No bad/new problems and nothing to explain her symptoms.  She needs to schedule a follow-up visit next available is ok.  This is very reassuring though I know she has chronic sxs let her know

## 2017-11-22 NOTE — Patient Outreach (Signed)
[facility]) Care Management  11/22/2017  ADALEA HANDLER 1955/12/22 [id number]  CSW completed outreach call to patient on 11/22/17 to follow up on community resources that were mailed to her and patient answered call. HIPPA verifications were provided. Patient confirms that she received complete list of financial resources that [facility] CSW mailed out to her. Patient also confirms that she received [employer] information packet in the mail and will complete that this afternoon. CSW provided education on [employer] program again as well as financial resources. Patient receptive to information. Patient understands that she can use [employer] as soon as she places the signed consents back in the mail box. Patient understands that she will need to schedule transportation arrangements through [employer] a few weeks in advance. Patient denies any further social work needs. Social work episode closed at this time.  Eula Fried, BSW, MSW, [email or web address] Phone: [phone] Fax: [phone]

## 2017-11-22 NOTE — Telephone Encounter (Signed)
REFILL 

## 2017-11-22 NOTE — Telephone Encounter (Signed)
Refill Request.  

## 2017-11-23 ENCOUNTER — Telehealth: Payer: Self-pay | Admitting: Pharmacist

## 2017-11-23 ENCOUNTER — Encounter: Payer: Self-pay | Admitting: *Deleted

## 2017-11-23 NOTE — Patient Outreach (Signed)
[facility]) Care Management  11/23/2017  Shannon [name] 1956/04/16 [id number]  62 y.o. year old female referred to [place] for Medication Adherence and Medication Management (Pharmacy telephone outreach)   Was unable to speak with patient at length via telephone today as she reports she is not feeling well after receiving IV contrast dye for imaging study yesterday, endorsing significant nausea. Patient requests I call back later tomorrow.  Plan: Set up appointment to speak over the phone tomorrow at 3:30 PM  Carlean Jews, Pharm.D., BCPS PGY2 Ambulatory Care Pharmacy Resident Phone: [phone]

## 2017-11-24 ENCOUNTER — Telehealth: Payer: Self-pay | Admitting: Pharmacist

## 2017-11-24 ENCOUNTER — Ambulatory Visit: Payer: Self-pay | Admitting: Pharmacist

## 2017-11-25 ENCOUNTER — Inpatient Hospital Stay: Payer: Medicare Other | Admitting: Adult Health

## 2017-11-25 ENCOUNTER — Other Ambulatory Visit: Payer: Self-pay | Admitting: *Deleted

## 2017-11-25 ENCOUNTER — Other Ambulatory Visit: Payer: Self-pay | Admitting: Pharmacist

## 2017-11-25 NOTE — Patient Outreach (Addendum)
[facility]) Care Management  11/25/2017  CHELCEE KORPI 10-Feb-1956 [id number]  62 year old female referred to [place] Management for transition of care services after recent hospitalization.  [place] services requested for medication management and medication adherence.  PMHx includes, but not limited to, HIV, chronic, severe asthma, allergies, nocturnal hypoxia, anxiety, atrial fibrillation, history of PE, HTN, HLD, CAD, CKD-III, obesity, and GERD with recent hospitalization 11/02/17-11/04/17 for acute asthma exacerbation.   Successful outreach call to Ms. Stidd today. HIPAA identifiers verified. Ms. Dimario declines pharmacy services today as she reports she has already reviewed medications with "other people" and she has too many appointments in the next few weeks to schedule a home visit.  I provided my phone number to Ms. Hogue to call me if she has any questions or concerns about her medications.  Ms. Rouillard expressed understanding and appreciation.    Outreach call to [facility] RN to discuss patient as RN has noted that patient has stopped taking several medications on her own without notifying physician and medication list is not up to date.  [facility] RN will continue to encourage patient to speak with me regarding her medications and offer for [place] to complete a joint home visit at the next face-to-face encounter with patient.   Plan: I will keep pharmacy case open at this time and follow-up with RN in 3 weeks regarding patient's consent for [facility] pharmacy services.   Ralene Bathe, PharmD, [place] [phone]

## 2017-11-25 NOTE — Patient Outreach (Signed)
[facility]) Care Management  11/25/2017  KARESSA ONORATO Oct 30, 1955 [id number]   Weekly transition of care call placed to member.  She immediately begins to discuss conversation with [facility] pharmacist.  State she does not feel she is in need of assistance at this time, but will contact if she need help.  Report she has multiple MD appointments over the next week and does not want to add to her schedule at this time.  She denies shortness of breath or chest discomfort, state she is feeling "much better."  Denies any urgent concerns, will follow up next week.    THN CM Care Plan Problem One     Most Recent Value  Care Plan Problem One  Risk for readmission/increased ED visits related to asthma as evidenced by recent hospitalization  Role Documenting the Problem One  Care Management [place] for Problem One  Active  [facility] Long Term Goal   Member will not be readmitted to hospital or have any ED visits for asthma within the next 31 days  THN Long Term Goal Start Date  11/10/17  Interventions for Problem One Long Term Goal  Verified member is aware of signs/symptoms of complicaitons of asthma, advised to contact MD with any symptoms prior to presenting to ED  [facility] CM Short Term Goal #1   Member will keep and attend appointments with all MDs within the next week   [facility] CM Short Term Goal #1 Start Date  11/10/17  Interventions for Short Term Goal #1  Reminded of appointments next week.  Advised of importance of keeping and attending appointments as she has history of no shows and canceling prior to date of visit.  THN CM Short Term Goal #2   Member will report compliance with medications as instructed over the next 4 weeks  THN CM Short Term Goal #2 Start Date  11/10/17  Interventions for Short Term Goal #2  Advised of importance of involvement with pharmacist in effort to effectivley reconcile medications.  Confirmed member has contact information for pharmacist, advised to  contact.      Valente David, [place], MSN [place] [phone]

## 2017-11-26 ENCOUNTER — Inpatient Hospital Stay: Payer: Medicare Other | Admitting: Adult Health

## 2017-11-29 ENCOUNTER — Ambulatory Visit: Payer: Medicare Other

## 2017-11-29 ENCOUNTER — Encounter: Payer: Self-pay | Admitting: Internal Medicine

## 2017-11-29 ENCOUNTER — Encounter (INDEPENDENT_AMBULATORY_CARE_PROVIDER_SITE_OTHER): Payer: Self-pay

## 2017-11-29 ENCOUNTER — Ambulatory Visit: Payer: Medicare Other | Admitting: Internal Medicine

## 2017-11-30 ENCOUNTER — Ambulatory Visit (INDEPENDENT_AMBULATORY_CARE_PROVIDER_SITE_OTHER): Payer: Medicare Other | Admitting: Adult Health

## 2017-11-30 ENCOUNTER — Ambulatory Visit (INDEPENDENT_AMBULATORY_CARE_PROVIDER_SITE_OTHER): Payer: Medicare Other | Admitting: Internal Medicine

## 2017-11-30 ENCOUNTER — Encounter: Payer: Self-pay | Admitting: Adult Health

## 2017-11-30 ENCOUNTER — Encounter: Payer: Self-pay | Admitting: Internal Medicine

## 2017-11-30 ENCOUNTER — Encounter (INDEPENDENT_AMBULATORY_CARE_PROVIDER_SITE_OTHER): Payer: Self-pay

## 2017-11-30 VITALS — BP 146/92 | HR 94 | Ht 63.0 in | Wt 220.2 lb

## 2017-11-30 DIAGNOSIS — K5909 Other constipation: Secondary | ICD-10-CM | POA: Diagnosis not present

## 2017-11-30 DIAGNOSIS — R1031 Right lower quadrant pain: Secondary | ICD-10-CM | POA: Diagnosis not present

## 2017-11-30 DIAGNOSIS — J301 Allergic rhinitis due to pollen: Secondary | ICD-10-CM

## 2017-11-30 DIAGNOSIS — G8929 Other chronic pain: Secondary | ICD-10-CM | POA: Diagnosis not present

## 2017-11-30 DIAGNOSIS — J4541 Moderate persistent asthma with (acute) exacerbation: Secondary | ICD-10-CM | POA: Diagnosis not present

## 2017-11-30 DIAGNOSIS — F418 Other specified anxiety disorders: Secondary | ICD-10-CM

## 2017-11-30 DIAGNOSIS — J9611 Chronic respiratory failure with hypoxia: Secondary | ICD-10-CM | POA: Diagnosis not present

## 2017-11-30 NOTE — Patient Instructions (Signed)
Continue on TRELEGY 1 puff daily . Rinse after use.  Saline nasal rinses and gel As needed   Continue on Flonase daily .  Continue on Claritin daily.  Continue on Oxygen 2l/m At bedtime  .  Follow up Dr. Elsworth Soho  In 2 months and As needed  .  Please contact office for sooner follow up if symptoms do not improve or worsen or seek emergency care

## 2017-11-30 NOTE — Assessment & Plan Note (Signed)
Cont on O2 At bedtime   

## 2017-11-30 NOTE — Patient Instructions (Signed)
  Eat prunes everyday per Dr Carlean Purl.  We are providing you with a note for school today.   I appreciate the opportunity to care for you. Silvano Rusk, MD, [facility]

## 2017-11-30 NOTE — Progress Notes (Signed)
@Patient  ID: Shannon [name], female    DOB: 1955-10-12, 62 y.o.   MRN: [id number]  Chief Complaint  Patient presents with  . Follow-up    Asthma     Referring provider: [name], MD  HPI: 62 year old [place]ever smokerfollowed for chronic severe asthmaand chronic respiratory failure on oxygen with activity and at bedtime  HIV dz f/by ID clinic   .TEST  CTa chest 12/2016 , 01/2017 >neg for PE , multifocal scarring bilaterally PFT (12/04/15) FEV1/FVC 50%, FEV1 0.64 31%.+++BD response. DLCO 66%. Sleep study in 04/2016 was (-) for OSA  11/30/2017 Follow up : Asthma /O2 RF  Patient returns for a one-month follow-up.  Patient was admitted last month with an acute asthma exacerbation.  She was treated with antibiotics .  Declined steroid treatment.  O2 saturations improved and did not require oxygen with activity. Since discharge patient is feeling better. Decreased cough and dyspnea. She remains on TRELEGY daily.  Having more allergy symptoms with watery eyes , and nasal congestion . On Flonase and Claritin .  Working with dietician on food options. Starting pulmonary rehab soon . Has bought a recumbent bike .  Wants to take class this summer at [facility] , needs letter.      Allergies  Allergen Reactions  . Tree Extract Swelling and Other (See Comments)    Swelling to eyes  . Ciprofloxacin Hives    There is no immunization history for the selected administration types on file for this patient.  Past Medical History:  Diagnosis Date  . Anemia   . Anxiety    HX PANIC ATTACKS  . Arthritis    "starting to; in my hands" (07/09/2015)  . Atrial fibrillation ([place])   . Atrial flutter, paroxysmal ([place])   . Chronic asthma with acute exacerbation    "I have chronic asthma all the time; sometimes exacerbations" (07/09/2015)  . Chronic lower back pain   . Cyst of right kidney    "3 of them; dx'd in ~ 01/2015"  . GERD (gastroesophageal reflux disease)   . Heart murmur   .  History of blood transfusion    "related to my brain surgery I think"  . History of pulmonary embolism 07/09/2015  . HIV antibody positive ([place])   . HIV disease ([place])   . Hyperlipidemia   . Hypertension   . Lipodystrophy   . Mild CAD 2013  . Pneumonia 07/09/2015  . Shingles   . Sleep apnea    "never completed part 2 of study; never wore mask" (07/09/2015)    Tobacco History: Social History   Tobacco Use  Smoking Status Never Smoker  Smokeless Tobacco Never Used   Counseling given: Not Answered   Outpatient Encounter Medications as of 11/30/2017  Medication Sig  . Ascorbic Acid (VITAMIN C) 1000 MG tablet Take 1,000 mg by mouth daily.  . bictegravir-emtricitabine-tenofovir AF (BIKTARVY) 50-200-25 MG TABS tablet Take 1 tablet by mouth daily.  [name] BIOTIN PO Take 1 tablet by mouth daily.  [name] CARTIA XT 120 MG 24 hr capsule TAKE 1 CAPSULE(120 MG) BY MOUTH DAILY  . carvedilol (COREG) 12.5 MG tablet Take 1 tablet (12.5 mg total) 2 (two) times daily with a meal by mouth.  . cholecalciferol (VITAMIN D) 1000 units tablet Take 1,000 Units by mouth daily.  . cyanocobalamin (,VITAMIN B-12,) 1000 MCG/ML injection ADMINISTER 1 ML(1000 MCG) IN THE MUSCLE EVERY 30 DAYS  . fluticasone (FLONASE) 50 MCG/ACT nasal spray Place 1 spray into both nostrils daily. (  Patient taking differently: Place 1 spray into both nostrils daily as needed for allergies or rhinitis. )  . Fluticasone-Umeclidin-Vilant (TRELEGY ELLIPTA) 100-62.5-25 MCG/INH AEPB Inhale 1 puff into the lungs daily.  [name] loratadine (CLARITIN) 10 MG tablet Take 1 tablet (10 mg total) by mouth daily as needed for allergies.  . Mouthwashes (BIOTENE DRY MOUTH GENTLE) LIQD Use as directed 1 Dose in the mouth or throat 2 (two) times daily.  . Multiple Vitamin (MULTIVITAMIN WITH MINERALS) TABS tablet Take 1 tablet by mouth daily.  [name] NIFEdipine (ADALAT CC) 90 MG 24 hr tablet Take 1 tablet (90 mg total) by mouth daily.  . OXYGEN Inhale 2.5-3 L into the lungs  at bedtime as needed (Shortness of Breath). 2.5 to 3 liters at night and as needed in the daytime.   . potassium chloride (K-DUR) 10 MEQ tablet Take 1 tablet (10 mEq total) daily by mouth. (Patient taking differently: Take 10 mEq by mouth every other day. )  . PROAIR HFA 108 (90 Base) MCG/ACT inhaler INHALE 2 PUFFS INTO THE LUNGS EVERY 4 HOURS AS NEEDED FOR WHEEZING OR SHORTNESS OF BREATH  . rivaroxaban (XARELTO) 20 MG TABS tablet Take 1 tablet (20 mg total) by mouth daily with supper.  . sodium chloride (OCEAN) 0.65 % SOLN nasal spray Place 1 spray into both nostrils as needed for congestion.  . Tetrahydrozoline HCl (VISINE OP) Place 2 drops into both eyes daily as needed (for dry eyes).    No facility-administered encounter medications on file as of 11/30/2017.      Review of Systems  Constitutional:   No  weight loss, night sweats,  Fevers, chills, +fatigue, or  lassitude.  HEENT:   No headaches,  Difficulty swallowing,  Tooth/dental problems, or  Sore throat,                No sneezing, itching, ear ache,  +nasal congestion, post nasal drip,   CV:  No chest pain,  Orthopnea, PND, swelling in lower extremities, anasarca, dizziness, palpitations, syncope.   GI  No heartburn, indigestion, abdominal pain, nausea, vomiting, diarrhea, change in bowel habits, loss of appetite, bloody stools.   Resp:    No chest wall deformity  Skin: no rash or lesions.  GU: no dysuria, change in color of urine, no urgency or frequency.  No flank pain, no hematuria   MS:  No joint pain or swelling.  No decreased range of motion.  No back pain.    Physical Exam  BP 140/82 (BP Location: Right Arm, Cuff Size: Large)   Pulse 85   Ht 5\' 3"  (1.6 m)   Wt 220 lb (99.8 kg)   SpO2 94%   BMI 38.97 kg/m   GEN: A/Ox3; pleasant , NAD, elderly , on o2    HEENT:  [place]/AT,  EACs-clear, TMs-wnl, NOSE-clear, THROAT-clear, no lesions, no postnasal drip or exudate noted.   NECK:  Supple w/ fair ROM; no JVD; normal  carotid impulses w/o bruits; no thyromegaly or nodules palpated; no lymphadenopathy.    RESP  Decreased BS in bases ,  no accessory muscle use, no dullness to percussion  CARD:  RRR, no m/r/g, no peripheral edema, pulses intact, no cyanosis or clubbing.  GI:   Soft & nt; nml bowel sounds; no organomegaly or masses detected.   Musco: Warm bil, no deformities or joint swelling noted.   Neuro: alert, no focal deficits noted.    Skin: Warm, no lesions or rashes    Lab Results:  CBC  BMET  No results found for: PROBNP  Imaging: Dg Chest 2 View  Result Date: 11/02/2017 CLINICAL DATA:  Chest pain. EXAM: CHEST - 2 VIEW COMPARISON:  Radiographs of August 25, 2017. FINDINGS: Stable cardiomegaly. No pneumothorax or pleural effusion is noted. Both lungs are clear. The visualized skeletal structures are unremarkable. IMPRESSION: No active cardiopulmonary disease. Electronically Signed   By: Marijo Conception, M.D.   On: 11/02/2017 14:44   Ct Abdomen Pelvis W Contrast  Result Date: 11/22/2017 CLINICAL DATA:  Epigastric abdominal pain and nausea for 6 months. EXAM: CT ABDOMEN AND PELVIS WITH CONTRAST TECHNIQUE: Multidetector CT imaging of the abdomen and pelvis was performed using the standard protocol following bolus administration of intravenous contrast. CONTRAST:  61mL ISOVUE-300 IOPAMIDOL (ISOVUE-300) INJECTION 61% COMPARISON:  CT scan 01/28/2017 FINDINGS: Lower chest: Stable right middle lobe and right lower lobe scarring changes. No new or worrisome findings. The heart is normal in size. Small amount of pericardial fluid. The distal esophagus is grossly normal. Hepatobiliary: No focal hepatic lesions or intrahepatic biliary dilatation. The gallbladder is grossly normal. No common bile duct dilatation. Pancreas: No mass, inflammation or ductal dilatation. Stable fatty cleft in the uncinate process. Spleen: Normal size.  No focal lesions. Adrenals/Urinary Tract: The adrenal glands are unremarkable  and stable. Multiple bilateral renal lesions are again demonstrated. These are combination of simple and hemorrhagic cysts and appear to be stable when compared to prior CT examinations. No new or worrisome lesions. No hydronephrosis or obstructing ureteral calculi. Bilateral renal artery calcifications. Stomach/Bowel: The stomach, duodenum, small bowel and colon are unremarkable. No acute inflammatory changes, mass lesions or obstructive findings. The terminal ileum is normal. The appendix is normal. Vascular/Lymphatic: Stable atherosclerotic calcifications involving the abdominal aorta and iliac arteries and branch vessels. No focal aneurysm or dissection. Reproductive: Surgically absent. Other: No pelvic mass or adenopathy. No free pelvic fluid collections. No inguinal mass or adenopathy. No abdominal wall hernia or subcutaneous lesions. Small inguinal hernias containing fat. Musculoskeletal: No significant bony findings. Moderate degenerative changes involving the spine, SI joints and hips. IMPRESSION: Impression 1. No acute abdominal/pelvic findings, mass lesions or adenopathy. 2. Numerous bilateral simple and hemorrhagic renal cysts. No new or worrisome findings. 3. Stable atherosclerotic calcifications involving the aorta and branch vessels. Electronically Signed   By: Marijo Sanes M.D.   On: 11/22/2017 12:35     Assessment & Plan:   Moderate persistent asthma with exacerbation Recent flare now improving .   Plan  Patient Instructions  Continue on TRELEGY 1 puff daily . Rinse after use.  Saline nasal rinses and gel As needed   Continue on Flonase daily .  Continue on Claritin daily.  Continue on Oxygen 2l/m At bedtime  .  Follow up Dr. Elsworth Soho  In 2 months and As needed  .  Please contact office for sooner follow up if symptoms do not improve or worsen or seek emergency care       Allergic rhinitis Flare with seasonal high pollen levels   Plan  Patient Instructions  Continue on  TRELEGY 1 puff daily . Rinse after use.  Saline nasal rinses and gel As needed   Continue on Flonase daily .  Continue on Claritin daily.  Continue on Oxygen 2l/m At bedtime  .  Follow up Dr. Elsworth Soho  In 2 months and As needed  .  Please contact office for sooner follow up if symptoms do not improve or worsen or seek emergency care  Chronic respiratory failure with hypoxia (HCC) Cont on O2 At bedtime       Rexene Edison, NP 11/30/2017

## 2017-11-30 NOTE — Assessment & Plan Note (Signed)
Flare with seasonal high pollen levels   Plan  Patient Instructions  Continue on TRELEGY 1 puff daily . Rinse after use.  Saline nasal rinses and gel As needed   Continue on Flonase daily .  Continue on Claritin daily.  Continue on Oxygen 2l/m At bedtime  .  Follow up Dr. Elsworth Soho  In 2 months and As needed  .  Please contact office for sooner follow up if symptoms do not improve or worsen or seek emergency care

## 2017-11-30 NOTE — Assessment & Plan Note (Signed)
Recent flare now improving .   Plan  Patient Instructions  Continue on TRELEGY 1 puff daily . Rinse after use.  Saline nasal rinses and gel As needed   Continue on Flonase daily .  Continue on Claritin daily.  Continue on Oxygen 2l/m At bedtime  .  Follow up Dr. Elsworth Soho  In 2 months and As needed  .  Please contact office for sooner follow up if symptoms do not improve or worsen or seek emergency care

## 2017-11-30 NOTE — Progress Notes (Signed)
Shannon [name] 62 y.o. 1956/06/07 [id number]  Assessment & Plan:   Encounter Diagnoses  Name Primary?  . Chronic constipation Yes  . Abdominal pain, chronic, right lower quadrant   . Anxiety about health     I have instructed her to eat prunes daily.  That is a simple and effective way to treat her constipation. She was reassured by the negative findings on CT scan.  Based upon clinical course and her overall health status I do not think it makes sense to do either diagnostic or screening endoscopic exams in her case.  She has severe COPD with a very low FEV1.  She seems a little bit less anxious about her health status today.  She will see me as needed.  I appreciate the opportunity to care for this patient. CC: [name], MD   Subjective:   Chief Complaint: Follow-up after CT scan and chronic abdominal pain  HPI The patient is here for follow-up after CT scanning in early April, because of chronic abdominal pain, was undertaken.  Had right lower quadrant and epigastric pain. belowOh aortic ulcers the CT scan demonstrated  Numerous bilateral simple and hemorrhagic renal cysts with stable atherosclerotic calcifications in the aorta and branch vessels but no other significant findings.  I relayed this to her and she was reassured today.  She was hospitalized with an asthma flare and hypoxemia in mid March.  She disagrees with a diagnosis of an asthma flare.  She thinks she may be recommended for oxygen therapy throughout the day but is still only using it at night.  She says that she has had constipation related to her medicines and when she bends over she thinks that that when she has abdominal pain in the right lower quadrant.  If she eats prunes she will defecate relatively easily but she does not eat them every day so she does not move her bowels every day or struggles at times.  Epigastric pain seems to be less of a problem now.  In general she is happier about things and  less anxious though still admittedly anxious over her health problems.  She had to take a leave of absence from school and would like a note stating she is stable and okay to return for the summer session. Allergies  Allergen Reactions  . Tree Extract Swelling and Other (See Comments)    Swelling to eyes  . Ciprofloxacin Hives   Current Meds  Medication Sig  . Ascorbic Acid (VITAMIN C) 1000 MG tablet Take 1,000 mg by mouth daily.  . bictegravir-emtricitabine-tenofovir AF (BIKTARVY) 50-200-25 MG TABS tablet Take 1 tablet by mouth daily.  [name] BIOTIN PO Take 1 tablet by mouth daily.  [name] CARTIA XT 120 MG 24 hr capsule TAKE 1 CAPSULE(120 MG) BY MOUTH DAILY  . carvedilol (COREG) 12.5 MG tablet Take 1 tablet (12.5 mg total) 2 (two) times daily with a meal by mouth.  . cholecalciferol (VITAMIN D) 1000 units tablet Take 1,000 Units by mouth daily.  . cyanocobalamin (,VITAMIN B-12,) 1000 MCG/ML injection ADMINISTER 1 ML(1000 MCG) IN THE MUSCLE EVERY 30 DAYS  . fluticasone (FLONASE) 50 MCG/ACT nasal spray Place 1 spray into both nostrils daily. (Patient taking differently: Place 1 spray into both nostrils daily as needed for allergies or rhinitis. )  . Fluticasone-Umeclidin-Vilant (TRELEGY ELLIPTA) 100-62.5-25 MCG/INH AEPB Inhale 1 puff into the lungs daily.  . Mouthwashes (BIOTENE DRY MOUTH GENTLE) LIQD Use as directed 1 Dose in the mouth or throat  2 (two) times daily.  . Multiple Vitamin (MULTIVITAMIN WITH MINERALS) TABS tablet Take 1 tablet by mouth daily.  [name] NIFEdipine (ADALAT CC) 90 MG 24 hr tablet Take 1 tablet (90 mg total) by mouth daily.  . OXYGEN Inhale 2.5-3 L into the lungs at bedtime as needed (Shortness of Breath). 2.5 to 3 liters at night and as needed in the daytime.   [name] PROAIR HFA 108 (90 Base) MCG/ACT inhaler INHALE 2 PUFFS INTO THE LUNGS EVERY 4 HOURS AS NEEDED FOR WHEEZING OR SHORTNESS OF BREATH  . rivaroxaban (XARELTO) 20 MG TABS tablet Take 1 tablet (20 mg total) by mouth daily with  supper.  . sodium chloride (OCEAN) 0.65 % SOLN nasal spray Place 1 spray into both nostrils as needed for congestion.  . Tetrahydrozoline HCl (VISINE OP) Place 2 drops into both eyes daily as needed (for dry eyes).    Past Medical History:  Diagnosis Date  . Anemia   . Anxiety    HX PANIC ATTACKS  . Arthritis    "starting to; in my hands" (07/09/2015)  . Atrial fibrillation ([place])   . Atrial flutter, paroxysmal ([place])   . Chronic asthma with acute exacerbation    "I have chronic asthma all the time; sometimes exacerbations" (07/09/2015)  . Chronic lower back pain   . Cyst of right kidney    "3 of them; dx'd in ~ 01/2015"  . GERD (gastroesophageal reflux disease)   . Heart murmur   . History of blood transfusion    "related to my brain surgery I think"  . History of pulmonary embolism 07/09/2015  . HIV antibody positive ([place])   . HIV disease ([place])   . Hyperlipidemia   . Hypertension   . Lipodystrophy   . Mild CAD 2013  . Pneumonia 07/09/2015  . Shingles   . Sleep apnea    "never completed part 2 of study; never wore mask" (07/09/2015)   Past Surgical History:  Procedure Laterality Date  . ABDOMINAL HYSTERECTOMY     "robotic laparosopic"  . BRAIN SURGERY  1974   "brain tumor; benign; on top of my brain; got a plate in there"  . CARDIAC CATHETERIZATION    . TONSILLECTOMY AND ADENOIDECTOMY     Social History   Social History Narrative   Originally from [place] then [place] now [place] 2016 approx   Disabled but going to [place] [address]   Updated 01/13/2017      family history includes Asthma in her mother; Diabetes in her sister; Heart failure in her mother; Heart murmur in her brother, sister, and sister; Thyroid disease in her sister.   Review of Systems As above  Objective:   Physical Exam BP (!) 146/92   Pulse 94   Ht 5\' 3"  (1.6 m)   Wt 220 lb 4 oz (99.9 kg)   BMI 39.02 kg/m  Obese black woman in no acute distress Talkative  15 minutes  time spent with patient > half in counseling coordination of care

## 2017-12-01 ENCOUNTER — Other Ambulatory Visit: Payer: Self-pay | Admitting: *Deleted

## 2017-12-01 DIAGNOSIS — B20 Human immunodeficiency virus [HIV] disease: Secondary | ICD-10-CM

## 2017-12-01 DIAGNOSIS — Z113 Encounter for screening for infections with a predominantly sexual mode of transmission: Secondary | ICD-10-CM

## 2017-12-02 ENCOUNTER — Ambulatory Visit: Payer: Medicare Other | Admitting: Cardiovascular Disease

## 2017-12-02 ENCOUNTER — Other Ambulatory Visit: Payer: Self-pay | Admitting: *Deleted

## 2017-12-02 NOTE — Patient Outreach (Signed)
[place] [facility]) Care Management  12/02/2017  JENNE SELLINGER 1955-11-13 [id number]   Weekly transition of care call placed to member, she report she is unable to talk right now as she is at a MD appointment.  State she will contact this care manager once her visit is complete.  Will await call back, if no call back will follow up within the next week.  Valente David, [place], MSN [place] [phone]

## 2017-12-06 ENCOUNTER — Ambulatory Visit (HOSPITAL_COMMUNITY): Payer: Medicare Other

## 2017-12-08 NOTE — Progress Notes (Signed)
Reviewed & agree with plan  

## 2017-12-09 ENCOUNTER — Other Ambulatory Visit: Payer: Self-pay | Admitting: *Deleted

## 2017-12-09 NOTE — Patient Outreach (Signed)
[facility]) Care Management  12/09/2017  Shannon [name] 08/22/1956 [id number]   Weekly transition of care call placed to member.  She report this is not a good time to talk as she is with the doctor.  Advised member to call this care manager back when she is available, will await call back.  If no call back will follow up next week.  Shannon [name], [place], MSN [place] [phone]

## 2017-12-11 DIAGNOSIS — J189 Pneumonia, unspecified organism: Secondary | ICD-10-CM | POA: Diagnosis not present

## 2017-12-11 DIAGNOSIS — G4733 Obstructive sleep apnea (adult) (pediatric): Secondary | ICD-10-CM | POA: Diagnosis not present

## 2017-12-13 ENCOUNTER — Other Ambulatory Visit: Payer: Medicare Other

## 2017-12-13 DIAGNOSIS — I10 Essential (primary) hypertension: Secondary | ICD-10-CM | POA: Diagnosis not present

## 2017-12-13 DIAGNOSIS — Z113 Encounter for screening for infections with a predominantly sexual mode of transmission: Secondary | ICD-10-CM

## 2017-12-13 DIAGNOSIS — B20 Human immunodeficiency virus [HIV] disease: Secondary | ICD-10-CM

## 2017-12-14 ENCOUNTER — Other Ambulatory Visit: Payer: Self-pay | Admitting: *Deleted

## 2017-12-14 LAB — COMPLETE METABOLIC PANEL WITH GFR
AG RATIO: 1.6 (calc) (ref 1.0–2.5)
ALBUMIN MSPROF: 4.1 g/dL (ref 3.6–5.1)
ALT: 13 U/L (ref 6–29)
AST: 16 U/L (ref 10–35)
Alkaline phosphatase (APISO): 80 U/L (ref 33–130)
BILIRUBIN TOTAL: 0.5 mg/dL (ref 0.2–1.2)
BUN / CREAT RATIO: 12 (calc) (ref 6–22)
BUN: 16 mg/dL (ref 7–25)
CHLORIDE: 100 mmol/L (ref 98–110)
CO2: 36 mmol/L — ABNORMAL HIGH (ref 20–32)
Calcium: 10.1 mg/dL (ref 8.6–10.4)
Creat: 1.33 mg/dL — ABNORMAL HIGH (ref 0.50–0.99)
GFR, EST AFRICAN AMERICAN: 49 mL/min/{1.73_m2} — AB (ref >=60)
GFR, Est Non African American: 42 mL/min/{1.73_m2} — ABNORMAL LOW (ref >=60)
Globulin: 2.6 g/dL (calc) (ref 1.9–3.7)
Glucose, Bld: 124 mg/dL — ABNORMAL HIGH (ref 65–99)
POTASSIUM: 4.2 mmol/L (ref 3.5–5.3)
Sodium: 142 mmol/L (ref 135–146)
TOTAL PROTEIN: 6.7 g/dL (ref 6.1–8.1)

## 2017-12-14 LAB — T-HELPER CELL (CD4) - (RCID CLINIC ONLY)
CD4 % Helper T Cell: 42 % (ref 33–55)
CD4 T Cell Abs: 670 /uL (ref 400–2700)

## 2017-12-14 LAB — CBC WITH DIFFERENTIAL/PLATELET
BASOS ABS: 22 {cells}/uL (ref 0–200)
Basophils Relative: 0.4 %
Eosinophils Absolute: 130 cells/uL (ref 15–500)
Eosinophils Relative: 2.4 %
HEMATOCRIT: 38.1 % (ref 35.0–45.0)
Hemoglobin: 12.7 g/dL (ref 11.7–15.5)
Lymphs Abs: 1442 cells/uL (ref 850–3900)
MCH: 30.2 pg (ref 27.0–33.0)
MCHC: 33.3 g/dL (ref 32.0–36.0)
MCV: 90.5 fL (ref 80.0–100.0)
MONOS PCT: 6.9 %
MPV: 11 fL (ref 7.5–12.5)
NEUTROS PCT: 63.6 %
Neutro Abs: 3434 cells/uL (ref 1500–7800)
Platelets: 214 10*3/uL (ref 140–400)
RBC: 4.21 10*6/uL (ref 3.80–5.10)
RDW: 12.5 % (ref 11.0–15.0)
Total Lymphocyte: 26.7 %
WBC mixed population: 373 cells/uL (ref 200–950)
WBC: 5.4 10*3/uL (ref 3.8–10.8)

## 2017-12-14 LAB — RPR: RPR: NONREACTIVE

## 2017-12-14 NOTE — Patient Outreach (Signed)
[place] [facility]) Care Management  12/14/2017  Shannon [name] Jun 01, 1956 [id number]   Weekly transition of care call placed to member.  She state she does not have much time to talk as she is completing paperwork to have cyst removed from her hand this week.  State she is doing well, denies complaints of shortness of breath or chest discomfort.  Will follow up next month, if remain stable will close case.  [facility] CM Care Plan Problem One     Most Recent Value  Care Plan Problem One  Risk for readmission/increased ED visits related to asthma as evidenced by recent hospitalization  Role Documenting the Problem One  Care Management [place] for Problem One  Active  [facility] Long Term Goal   Member will not be readmitted to hospital or have any ED visits for asthma within the next 31 days  [facility] Long Term Goal Start Date  11/10/17  [facility] Long Term Goal Met Date  12/14/17  [facility] CM Short Term Goal #1   Member will keep and attend appointments with all MDs within the next week   [facility] CM Short Term Goal #1 Start Date  11/10/17  [facility] CM Short Term Goal #1 Met Date  12/14/17  [facility] CM Short Term Goal #2   Member will report compliance with medications as instructed over the next 4 weeks  [facility] CM Short Term Goal #2 Start Date  11/10/17  [facility] CM Short Term Goal #2 Met Date  12/14/17     Valente David, RN, MSN [place] [phone]

## 2017-12-15 ENCOUNTER — Other Ambulatory Visit: Payer: Self-pay

## 2017-12-15 DIAGNOSIS — M67431 Ganglion, right wrist: Secondary | ICD-10-CM | POA: Diagnosis not present

## 2017-12-15 DIAGNOSIS — M65841 Other synovitis and tenosynovitis, right hand: Secondary | ICD-10-CM | POA: Diagnosis not present

## 2017-12-15 LAB — HIV-1 RNA QUANT-NO REFLEX-BLD
HIV 1 RNA Quant: 47 copies/mL — ABNORMAL HIGH
HIV-1 RNA QUANT, LOG: 1.67 {Log_copies}/mL — AB

## 2017-12-20 ENCOUNTER — Other Ambulatory Visit: Payer: Self-pay | Admitting: Pharmacist

## 2017-12-20 NOTE — Patient Outreach (Signed)
[place] [facility]) Care Management  12/20/2017  Tracyann Duffell Nath Sep 05, 1955 [id number]  [facility] pharmacy case will be closed as patient has declined services and has not reached out to me in the last 3 weeks.  I have updated [facility] RN and am happy to assist in the future if patient is interested in reviewing medications.   Ralene Bathe, PharmD, [place] [phone]

## 2017-12-27 DIAGNOSIS — H1045 Other chronic allergic conjunctivitis: Secondary | ICD-10-CM | POA: Diagnosis not present

## 2017-12-27 DIAGNOSIS — R05 Cough: Secondary | ICD-10-CM | POA: Diagnosis not present

## 2017-12-27 DIAGNOSIS — J309 Allergic rhinitis, unspecified: Secondary | ICD-10-CM | POA: Diagnosis not present

## 2017-12-27 IMAGING — CR DG CHEST 2V
2 series · 2 of 2 positions shown · non-contrast
Comparison: CT 01/05/2017.  Chest x-ray 01/05/2017, 04/13/2016.

CLINICAL DATA: Shortness of breath.

EXAM:
CHEST  2 VIEW

[chest lat]
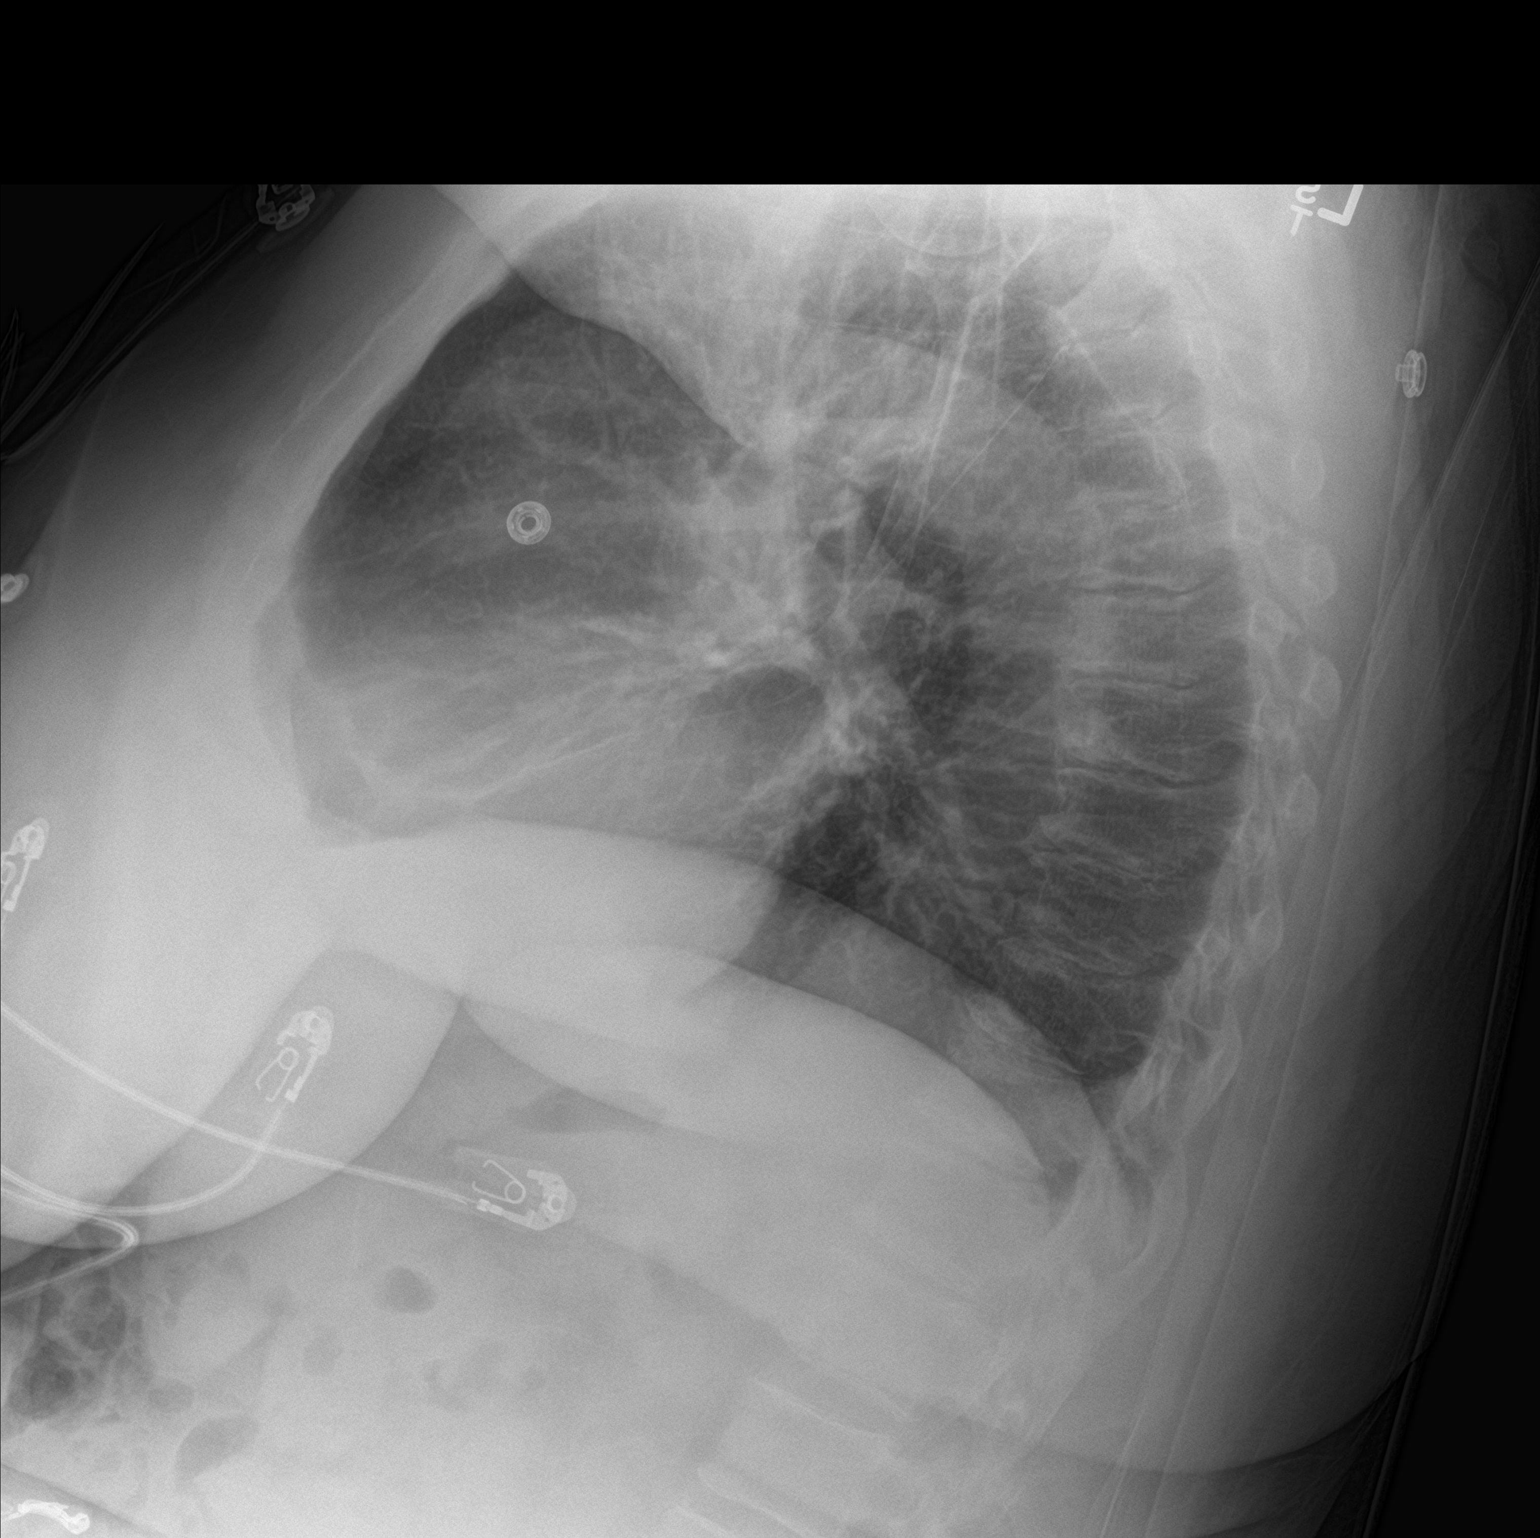

[chest ap]
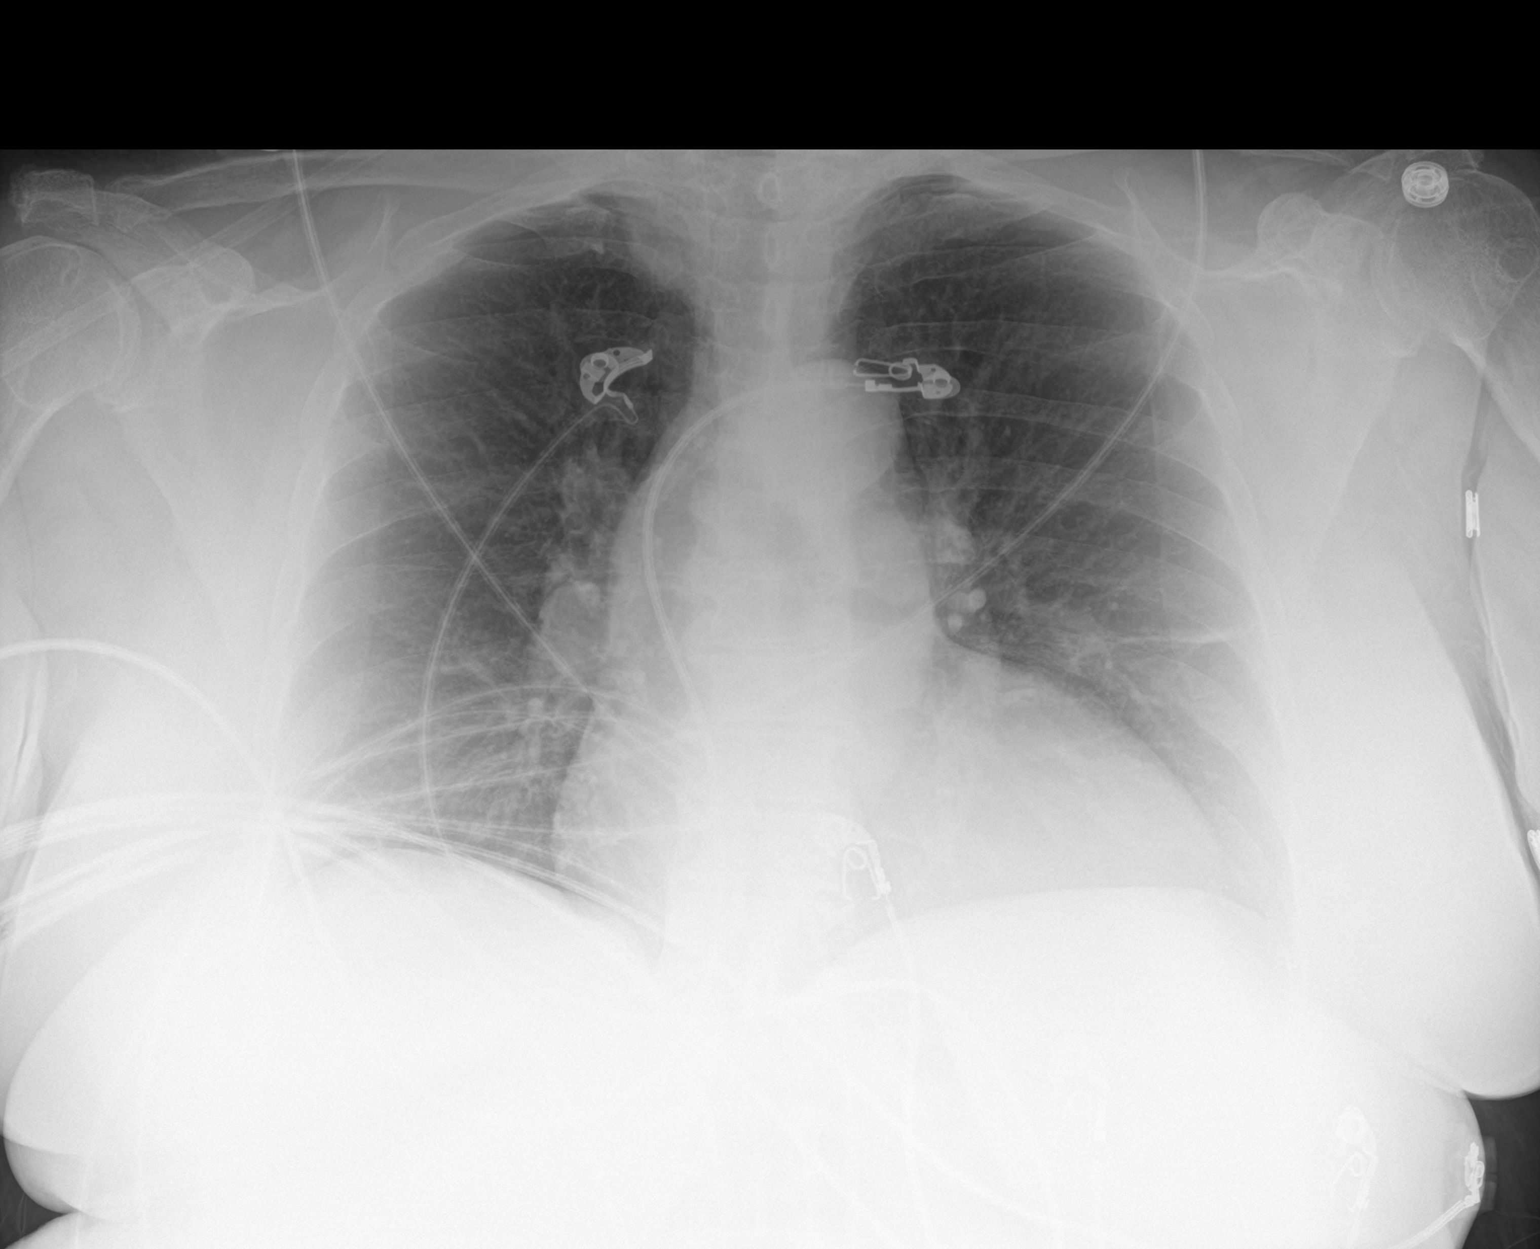

[2 of 2 positions shown; findings below may reference images not displayed]

FINDINGS: Mediastinum and hilar structures normal. Stable cardiomegaly. Left
mid lung field subsegmental atelectasis. No pleural effusion or
pneumothorax .
IMPRESSION: 1. Left mid lung field subsegmental atelectasis and/or scarring
again noted.

2. No interim change. Stable cardiomegaly. No pulmonary venous
congestion .

## 2017-12-28 DIAGNOSIS — L818 Other specified disorders of pigmentation: Secondary | ICD-10-CM | POA: Diagnosis not present

## 2017-12-28 DIAGNOSIS — L918 Other hypertrophic disorders of the skin: Secondary | ICD-10-CM | POA: Diagnosis not present

## 2017-12-29 ENCOUNTER — Ambulatory Visit: Payer: Medicare Other | Admitting: Internal Medicine

## 2018-01-03 ENCOUNTER — Encounter: Payer: Self-pay | Admitting: Internal Medicine

## 2018-01-03 ENCOUNTER — Ambulatory Visit (INDEPENDENT_AMBULATORY_CARE_PROVIDER_SITE_OTHER): Payer: Medicare Other | Admitting: Internal Medicine

## 2018-01-03 ENCOUNTER — Ambulatory Visit (INDEPENDENT_AMBULATORY_CARE_PROVIDER_SITE_OTHER): Payer: Medicare Other | Admitting: Cardiology

## 2018-01-03 ENCOUNTER — Encounter: Payer: Self-pay | Admitting: Cardiology

## 2018-01-03 VITALS — BP 158/108 | HR 78 | Temp 98.3°F | Wt 220.0 lb

## 2018-01-03 DIAGNOSIS — I2510 Atherosclerotic heart disease of native coronary artery without angina pectoris: Secondary | ICD-10-CM | POA: Diagnosis not present

## 2018-01-03 DIAGNOSIS — G4734 Idiopathic sleep related nonobstructive alveolar hypoventilation: Secondary | ICD-10-CM | POA: Diagnosis not present

## 2018-01-03 DIAGNOSIS — B20 Human immunodeficiency virus [HIV] disease: Secondary | ICD-10-CM

## 2018-01-03 DIAGNOSIS — I483 Typical atrial flutter: Secondary | ICD-10-CM

## 2018-01-03 DIAGNOSIS — N183 Chronic kidney disease, stage 3 unspecified: Secondary | ICD-10-CM

## 2018-01-03 DIAGNOSIS — J3089 Other allergic rhinitis: Secondary | ICD-10-CM | POA: Diagnosis not present

## 2018-01-03 DIAGNOSIS — E669 Obesity, unspecified: Secondary | ICD-10-CM

## 2018-01-03 DIAGNOSIS — I10 Essential (primary) hypertension: Secondary | ICD-10-CM | POA: Diagnosis not present

## 2018-01-03 DIAGNOSIS — Z7901 Long term (current) use of anticoagulants: Secondary | ICD-10-CM

## 2018-01-03 NOTE — Assessment & Plan Note (Signed)
Pt declined treatment options suggested- will review with Dr Oval Linsey

## 2018-01-03 NOTE — Assessment & Plan Note (Signed)
Xarelto

## 2018-01-03 NOTE — Assessment & Plan Note (Signed)
BMI 38 

## 2018-01-03 NOTE — Assessment & Plan Note (Signed)
Mild non obstructive CAD at cath in 2013 [facility]) Negative Myoview June 2017

## 2018-01-03 NOTE — Progress Notes (Signed)
01/03/2018 Shannon [name]   02/18/1956  [id number]  Primary Physician Shannon Falcon, MD Primary Cardiologist: Dr Oval Linsey  HPI:  Pleasant 62 y/o female followed by Dr Sallyanne Kuster with a history of non obstructive CAD, PAF, asthmatic COPD, morbid obesity, HIV, and poorly controlled HTN. She had normal renal artery dopplers in Oct 2018. She is in the office today for a 6 month follow up. She had a ganglion cyst removed two weeks ago. Her B/P today was 170/100 by me with a large cuff. She says the pharmacy called her and took her off Losartan some time ago. She takes both Nifedipine and Diltiazem, she didn't take her Diltiazem yet this am. She says she is constantly thirsty and drinks a lot of water. She has Lasix but doesn't take it because she is afraid it will dehydrate her. She attributes her high B/P to her surgery two weeks ago.     Current Outpatient Medications  Medication Sig Dispense Refill  . Ascorbic Acid (VITAMIN C) 1000 MG tablet Take 1,000 mg by mouth daily.    . bictegravir-emtricitabine-tenofovir AF (BIKTARVY) 50-200-25 MG TABS tablet Take 1 tablet by mouth daily. 30 tablet 11  . BIOTIN PO Take 1 tablet by mouth daily.    [name] CARTIA XT 120 MG 24 hr capsule TAKE 1 CAPSULE(120 MG) BY MOUTH DAILY 90 capsule 0  . carvedilol (COREG) 12.5 MG tablet Take 1 tablet (12.5 mg total) 2 (two) times daily with a meal by mouth. 60 tablet 5  . cholecalciferol (VITAMIN D) 1000 units tablet Take 1,000 Units by mouth daily.    . cyanocobalamin (,VITAMIN B-12,) 1000 MCG/ML injection ADMINISTER 1 ML(1000 MCG) IN THE MUSCLE EVERY 30 DAYS 1 mL 5  . fluticasone (FLONASE) 50 MCG/ACT nasal spray Place 1 spray into both nostrils daily. (Patient taking differently: Place 1 spray into both nostrils daily as needed for allergies or rhinitis. ) 9.9 g 0  . Fluticasone-Umeclidin-Vilant (TRELEGY ELLIPTA) 100-62.5-25 MCG/INH AEPB Inhale 1 puff into the lungs daily. 1 each 0  . Mouthwashes (BIOTENE DRY MOUTH GENTLE)  LIQD Use as directed 1 Dose in the mouth or throat 2 (two) times daily. 1 Bottle 0  . Multiple Vitamin (MULTIVITAMIN WITH MINERALS) TABS tablet Take 1 tablet by mouth daily. 90 tablet   . NIFEdipine (ADALAT CC) 90 MG 24 hr tablet Take 1 tablet (90 mg total) by mouth daily. 30 tablet 5  . OXYGEN Inhale 2.5-3 L into the lungs at bedtime as needed (Shortness of Breath). 2.5 to 3 liters at night and as needed in the daytime.     [name] PROAIR HFA 108 (90 Base) MCG/ACT inhaler INHALE 2 PUFFS INTO THE LUNGS EVERY 4 HOURS AS NEEDED FOR WHEEZING OR SHORTNESS OF BREATH 17 g 2  . rivaroxaban (XARELTO) 20 MG TABS tablet Take 1 tablet (20 mg total) by mouth daily with supper. 30 tablet 6  . sodium chloride (OCEAN) 0.65 % SOLN nasal spray Place 1 spray into both nostrils as needed for congestion. 30 mL 1  . Tetrahydrozoline HCl (VISINE OP) Place 2 drops into both eyes daily as needed (for dry eyes).     [name] loratadine (CLARITIN) 10 MG tablet Take 1 tablet (10 mg total) by mouth daily as needed for allergies. 30 tablet 2  . potassium chloride (K-DUR) 10 MEQ tablet Take 1 tablet (10 mEq total) daily by mouth. (Patient taking differently: Take 10 mEq by mouth every other day. ) 30 tablet 5   No  current facility-administered medications for this visit.     Allergies  Allergen Reactions  . Tree Extract Swelling and Other (See Comments)    Swelling to eyes  . Ciprofloxacin Hives    Past Medical History:  Diagnosis Date  . Anemia   . Anxiety    HX PANIC ATTACKS  . Arthritis    "starting to; in my hands" (07/09/2015)  . Atrial fibrillation ([place])   . Atrial flutter, paroxysmal ([place])   . Chronic asthma with acute exacerbation    "I have chronic asthma all the time; sometimes exacerbations" (07/09/2015)  . Chronic lower back pain   . Cyst of right kidney    "3 of them; dx'd in ~ 01/2015"  . GERD (gastroesophageal reflux disease)   . Heart murmur   . History of blood transfusion    "related to my brain surgery  I think"  . History of pulmonary embolism 07/09/2015  . HIV antibody positive ([place])   . HIV disease ([place])   . Hyperlipidemia   . Hypertension   . Lipodystrophy   . Mild CAD 2013  . Pneumonia 07/09/2015  . Shingles   . Sleep apnea    "never completed part 2 of study; never wore mask" (07/09/2015)    Social History   Socioeconomic History  . Marital status: Divorced    Spouse name: Not on file  . Number of children: Not on file  . Years of education: Not on file  . Highest education level: Not on file  Occupational History  . Occupation: Ship broker  Social Needs  . Financial resource strain: Not on file  . Food insecurity:    Worry: Not on file    Inability: Not on file  . Transportation needs:    Medical: Not on file    Non-medical: Not on file  Tobacco Use  . Smoking status: Never Smoker  . Smokeless tobacco: Never Used  Substance and Sexual Activity  . Alcohol use: Yes    Alcohol/week: 0.0 oz    Comment: Rarely.  . Drug use: No  . Sexual activity: Not Currently    Partners: Male    Comment: declined condoms  Lifestyle  . Physical activity:    Days per week: Not on file    Minutes per session: Not on file  . Stress: Not on file  Relationships  . Social connections:    Talks on phone: Not on file    Gets together: Not on file    Attends religious service: Not on file    Active member of club or organization: Not on file    Attends meetings of clubs or organizations: Not on file    Relationship status: Not on file  . Intimate partner violence:    Fear of current or ex partner: Not on file    Emotionally abused: Not on file    Physically abused: Not on file    Forced sexual activity: Not on file  Other Topics Concern  . Not on file  Social History Narrative   Originally from [place] then [place] now [place] 2016 approx   Disabled but going to [place] [id number]   Updated 01/13/2017      Current Social History 11/17/2017  Family History  Problem Relation Age of Onset  . Asthma Mother   . Heart failure Mother        cardiomyopathy  . Heart murmur Sister   . Heart murmur Brother   . Diabetes Sister   . Thyroid disease Sister   . Heart murmur Sister      Review of Systems: General: negative for chills, fever, night sweats or weight changes.  Cardiovascular: negative for chest pain, dyspnea on exertion, edema, orthopnea, palpitations, paroxysmal nocturnal dyspnea or shortness of breath Dermatological: negative for rash Respiratory: negative for cough or wheezing Urologic: negative for hematuria Abdominal: negative for nausea, vomiting, diarrhea, bright red blood per rectum, melena, or hematemesis Neurologic: negative for visual changes, syncope, or dizziness All other systems reviewed and are otherwise negative except as noted above.    Blood pressure (!) 178/112, pulse 94, height 5\' 3"  (1.6 m), weight 220 lb (99.8 kg).  General appearance: alert, cooperative, no distress and moderately obese Neck: no JVD Lungs: clear to auscultation bilaterally and decreased breath sounds, no wheezing Heart: regular rate and rhythm and short sytolic murmur AOV, preseved S2 Extremities: no edema Skin: Skin color, texture, turgor normal. No rashes or lesions Neurologic: Grossly normal   ASSESSMENT AND PLAN:   Uncontrolled hypertension Pt declined treatment options suggested- will review with Dr Oval Linsey  CAD (coronary artery disease) Mild non obstructive CAD at cath in 2013 [facility]) Negative Myoview June 2017  Obesity (BMI 30-39.9) BMI 38  Atrial flutter ([place]) Atrial flutter June 2018- declined DCCV and opted for Flecainide therapy- and converted  To NSR.  Followed in AF clinic, on chronic anticoagulation, not on Flecainide  Nocturnal hypoxemia Non-apneic Nocturnal hypoxemia diagnosed by sleep study 05/11/16 improved with 1L supplemental O2  via [place]. Likely secondary to underlying Asthma/ COPD  Chronic anticoagulation Xarelto   PLAN  I suggested we consider an alternative ARB agent but she refused to consider that. I suggested she might need a daily diuretic but she declined. I'll review other options with Dr Oval Linsey. She needs clearance for allergy shots with Dr Donneta Romberg and I'll send a note to the Allergy clinic.   [name] PA-C 01/03/2018 11:04 AM

## 2018-01-03 NOTE — Patient Instructions (Signed)
Medication Instructions:  Your physician recommends that you continue on your current medications as directed. Please refer to the Current Medication list given to you today.  Labwork: None   Testing/Procedures: None   Follow-Up: Your physician recommends that you schedule a follow-up appointment in: 3 months with Dr Oval Linsey.   Any Other Special Instructions Will Be Listed Below (If Applicable). Please check your blood pressure and contact the office with your readings.   If you need a refill on your cardiac medications before your next appointment, please call your pharmacy.

## 2018-01-03 NOTE — Assessment & Plan Note (Signed)
Atrial flutter June 2018- declined DCCV and opted for Flecainide therapy- and converted  To NSR.  Followed in AF clinic, on chronic anticoagulation, not on Flecainide

## 2018-01-03 NOTE — Patient Instructions (Addendum)
Come back in 4 wk for labs  We will have dentistry to try to see you sooner than next month.

## 2018-01-03 NOTE — Assessment & Plan Note (Signed)
Non-apneic Nocturnal hypoxemia diagnosed by sleep study 05/11/16 improved with 1L supplemental O2 via [place]. Likely secondary to underlying Asthma/ COPD

## 2018-01-06 ENCOUNTER — Ambulatory Visit: Payer: Medicare Other

## 2018-01-07 ENCOUNTER — Other Ambulatory Visit: Payer: Self-pay

## 2018-01-07 ENCOUNTER — Ambulatory Visit (INDEPENDENT_AMBULATORY_CARE_PROVIDER_SITE_OTHER): Payer: Medicare Other | Admitting: Internal Medicine

## 2018-01-07 VITALS — BP 163/96 | HR 93 | Temp 98.0°F | Ht 63.0 in | Wt 222.8 lb

## 2018-01-07 DIAGNOSIS — J45909 Unspecified asthma, uncomplicated: Secondary | ICD-10-CM | POA: Diagnosis not present

## 2018-01-07 DIAGNOSIS — J329 Chronic sinusitis, unspecified: Secondary | ICD-10-CM

## 2018-01-07 DIAGNOSIS — B20 Human immunodeficiency virus [HIV] disease: Secondary | ICD-10-CM

## 2018-01-07 DIAGNOSIS — Z4789 Encounter for other orthopedic aftercare: Secondary | ICD-10-CM | POA: Diagnosis not present

## 2018-01-07 DIAGNOSIS — M67431 Ganglion, right wrist: Secondary | ICD-10-CM | POA: Diagnosis not present

## 2018-01-07 DIAGNOSIS — J0100 Acute maxillary sinusitis, unspecified: Secondary | ICD-10-CM

## 2018-01-07 MED ORDER — AMOXICILLIN 875 MG PO TABS: 875.0000 mg | ORAL_TABLET | Freq: Two times a day (BID) | ORAL | 0 refills | Status: DC

## 2018-01-07 NOTE — Progress Notes (Signed)
RFV: follow up for hiv disease  Patient ID: Shannon [name], female   DOB: 11/25/1955, 62 y.o.   MRN: [id number]  HPI Shannon [name] is a 62yo F with hiv disease, CD 4 count of 670/VL 47 on biktarvy. She recently was seen by allergist to do skin testing as part of work up for chronic cough, constant nasal congestion. She is asking for medical clearance to undergo allergy shots. She is also going to be followed by having an upcoming appt with Dr Elsworth Soho from pulmonology to evaluate her worsening dyspnea on exertion.   ROS: feels central obesity also affecting sleeping/difficulty with breathing  Outpatient Encounter Medications as of 01/03/2018  Medication Sig  . Ascorbic Acid (VITAMIN C) 1000 MG tablet Take 1,000 mg by mouth daily.  [name] azithromycin (ZITHROMAX) 250 MG tablet Take by mouth daily.  . bictegravir-emtricitabine-tenofovir AF (BIKTARVY) 50-200-25 MG TABS tablet Take 1 tablet by mouth daily.  [name] BIOTIN PO Take 1 tablet by mouth daily.  [name] CARTIA XT 120 MG 24 hr capsule TAKE 1 CAPSULE(120 MG) BY MOUTH DAILY  . carvedilol (COREG) 12.5 MG tablet Take 1 tablet (12.5 mg total) 2 (two) times daily with a meal by mouth.  . cholecalciferol (VITAMIN D) 1000 units tablet Take 1,000 Units by mouth daily.  . cyanocobalamin (,VITAMIN B-12,) 1000 MCG/ML injection ADMINISTER 1 ML(1000 MCG) IN THE MUSCLE EVERY 30 DAYS  . fluticasone (FLONASE) 50 MCG/ACT nasal spray Place 1 spray into both nostrils daily. (Patient taking differently: Place 1 spray into both nostrils daily as needed for allergies or rhinitis. )  . Fluticasone-Umeclidin-Vilant (TRELEGY ELLIPTA) 100-62.5-25 MCG/INH AEPB Inhale 1 puff into the lungs daily.  . Mouthwashes (BIOTENE DRY MOUTH GENTLE) LIQD Use as directed 1 Dose in the mouth or throat 2 (two) times daily.  . Multiple Vitamin (MULTIVITAMIN WITH MINERALS) TABS tablet Take 1 tablet by mouth daily.  . OXYGEN Inhale 2.5-3 L into the lungs at bedtime as needed (Shortness of Breath). 2.5 to 3  liters at night and as needed in the daytime.   [name] PROAIR HFA 108 (90 Base) MCG/ACT inhaler INHALE 2 PUFFS INTO THE LUNGS EVERY 4 HOURS AS NEEDED FOR WHEEZING OR SHORTNESS OF BREATH  . rivaroxaban (XARELTO) 20 MG TABS tablet Take 1 tablet (20 mg total) by mouth daily with supper.  . sodium chloride (OCEAN) 0.65 % SOLN nasal spray Place 1 spray into both nostrils as needed for congestion.  . Tetrahydrozoline HCl (VISINE OP) Place 2 drops into both eyes daily as needed (for dry eyes).   [name] loratadine (CLARITIN) 10 MG tablet Take 1 tablet (10 mg total) by mouth daily as needed for allergies.  [name] NIFEdipine (ADALAT CC) 90 MG 24 hr tablet Take 1 tablet (90 mg total) by mouth daily. (Patient not taking: Reported on 01/03/2018)  . potassium chloride (K-DUR) 10 MEQ tablet Take 1 tablet (10 mEq total) daily by mouth. (Patient taking differently: Take 10 mEq by mouth every other day. )   No facility-administered encounter medications on file as of 01/03/2018.      Patient Active Problem List   Diagnosis Date Noted  . Chronic anticoagulation 01/03/2018  . Chronic respiratory failure with hypoxia ([place]) 11/30/2017  . Constipation 11/03/2017  . Allergic rhinitis 05/14/2017  . Atrial flutter ([place]) 03/11/2017  . CAD (coronary artery disease) 01/28/2017  . Obesity (BMI 30-39.9) 01/13/2017  . Ascending aorta dilatation (H[facility]) 01/07/2017  . Low back pain radiating to right lower extremity 12/02/2016  . Bilateral renal  cysts 12/02/2016  . Chronic kidney disease (CKD), stage III (moderate) ([place]) 12/01/2016  . Generalized anxiety disorder 10/22/2016  . Hypersomnia 10/01/2016  . Nocturnal hypoxemia 05/13/2016  . Cervical radiculopathy 04/14/2016  . GERD (gastroesophageal reflux disease) 01/02/2016  . Vitamin B12 deficiency 10/02/2015  . Dyspnea 07/09/2015  . Moderate persistent asthma with exacerbation 07/09/2015  . HIV disease ([place]) 07/09/2015  . Uncontrolled hypertension 07/09/2015     Health Maintenance  Due  Topic Date Due  . PNEUMOCOCCAL POLYSACCHARIDE VACCINE (1) 03/03/1956  . TETANUS/TDAP  03/03/1973  . COLONOSCOPY  03/03/2004     Review of Systems Per hpi, otherwise 12 point ros is negative Physical Exam   BP (!) 158/108   Pulse 78   Temp 98.3 F (36.8 C) (Oral)   Wt 220 lb (99.8 kg)   BMI 38.97 kg/m   Physical Exam  Constitutional:  oriented to person, place, and time. appears well-developed and well-nourished. No distress.  HENT: [place]/AT, PERRLA, no scleral icterus Mouth/Throat: Oropharynx is clear and moist. No oropharyngeal exudate.  Cardiovascular: Normal rate, regular rhythm and normal heart sounds. Exam reveals no gallop and no friction rub.  No murmur heard.  Pulmonary/Chest: Effort normal and breath sounds normal. No respiratory distress.  has no wheezes.  Neck = supple, no nuchal rigidity Abdominal: Soft. Bowel sounds are normal.  exhibits no distension. There is no tenderness.  Lymphadenopathy: no cervical adenopathy. No axillary adenopathy Neurological: alert and oriented to person, place, and time.  Skin: Skin is warm and dry. No rash noted. No erythema.  Psychiatric: a normal mood and affect.  behavior is normal.   Lab Results  Component Value Date   CD4TCELL 42 12/13/2017   Lab Results  Component Value Date   CD4TABS 670 12/13/2017   CD4TABS 520 09/02/2017   CD4TABS 620 05/12/2017   Lab Results  Component Value Date   HIV1RNAQUANT 47 (H) 12/13/2017   No results found for: HEPBSAB Lab Results  Component Value Date   LABRPR NON-REACTIVE 12/13/2017    CBC Lab Results  Component Value Date   WBC 5.4 12/13/2017   RBC 4.21 12/13/2017   HGB 12.7 12/13/2017   HCT 38.1 12/13/2017   PLT 214 12/13/2017   MCV 90.5 12/13/2017   MCH 30.2 12/13/2017   MCHC 33.3 12/13/2017   RDW 12.5 12/13/2017   LYMPHSABS 1,442 12/13/2017   MONOABS 0.1 11/02/2017   EOSABS 130 12/13/2017    BMET Lab Results  Component Value Date   NA 142 12/13/2017   K 4.2  12/13/2017   CL 100 12/13/2017   CO2 36 (H) 12/13/2017   GLUCOSE 124 (H) 12/13/2017   BUN 16 12/13/2017   CREATININE 1.33 (H) 12/13/2017   CALCIUM 10.1 12/13/2017   GFRNONAA 42 (L) 12/13/2017   GFRAA 49 (L) 12/13/2017    Assessment and Plan  HIV disease= continue on biktarvy  CKD 3 : stable   Long term medication monitoring = can still continue with biktarvy   Multiple environmental allergies = she does not have any contra-indications for having allergy shots.

## 2018-01-07 NOTE — Patient Instructions (Addendum)
FOLLOW-UP INSTRUCTIONS When: if symptoms worsen or fail to improve  What to bring: All of your medications  I have prescribed amoxicillin 875mg  2 times daily for 5 days.  If your symptoms fail to resolve or worsen notably please contact us 24/7.  Thank you for a visit to [facility] Internal Medicine clinic today.

## 2018-01-07 NOTE — Progress Notes (Signed)
   CC: sinus pain  HPI:[place] [name] is a 62 y.o. female who presents today for evaluation of sinus pain.   Sinusitis: This has been an ongoing issues for > one week. She denied fever, chills, nausea, vomiting, diarrhea, constipation, chest pain, pharyngitis, rhinorrhea abdominal pain, myalgias, joint pain. She endorsed sinus pain, headache, tooth pain, wheezing, and chest tightness.  The patient refused potential steroids for her wheezing stating that the wheezing was her asthma that resulted from the sinus infection. Although she is at a low likelihood to have bacterial sinusitis, given her history of HIV and recurrent sinus inflections I will treat with a short course of amoxicillin.   Plan: Amoxicillin 875mg  BID for five days Patient unwilling to accept potential treatment for asthma other than her current inhaler.  She was advised to return if her symptoms worsen or fail to improve.   Past Medical History:  Diagnosis Date  . Anemia   . Anxiety    HX PANIC ATTACKS  . Arthritis    "starting to; in my hands" (07/09/2015)  . Atrial fibrillation ([place])   . Atrial flutter, paroxysmal ([place])   . Chronic asthma with acute exacerbation    "I have chronic asthma all the time; sometimes exacerbations" (07/09/2015)  . Chronic lower back pain   . Cyst of right kidney    "3 of them; dx'd in ~ 01/2015"  . GERD (gastroesophageal reflux disease)   . Heart murmur   . History of blood transfusion    "related to my brain surgery I think"  . History of pulmonary embolism 07/09/2015  . HIV antibody positive ([place])   . HIV disease ([place])   . Hyperlipidemia   . Hypertension   . Lipodystrophy   . Mild CAD 2013  . Pneumonia 07/09/2015  . Shingles   . Sleep apnea    "never completed part 2 of study; never wore mask" (07/09/2015)   Review of Systems:  ROS negative except as per HPI.  Physical Exam:  Vitals:   01/07/18 1334  BP: (!) 163/96  Pulse: 93  Temp: 98 F (36.7 C)  TempSrc: Oral   SpO2: 91%  Weight: 222 lb 12.8 oz (101.1 kg)  Height: 5\' 3"  (1.6 m)   Physical Exam  Constitutional: She is oriented to person, place, and time. She appears well-developed and well-nourished. No distress.  HENT:  Head: Normocephalic.  Nose: Rhinorrhea present. Right sinus exhibits maxillary sinus tenderness. Left sinus exhibits maxillary sinus tenderness and frontal sinus tenderness.  Eyes: Pupils are equal, round, and reactive to light. Conjunctivae are normal. Right eye exhibits no discharge. No scleral icterus.  Neck: Normal range of motion. Neck supple.  Cardiovascular: Normal rate and regular rhythm.  No murmur heard. Pulmonary/Chest: Effort normal and breath sounds normal. No stridor. No respiratory distress.  Musculoskeletal: She exhibits no edema or tenderness.  Lymphadenopathy:    She has no cervical adenopathy.  Neurological: She is alert and oriented to person, place, and time.  Skin: She is not diaphoretic.  Vitals reviewed.  Assessment & Plan:   See Encounters Tab for problem based charting.  Patient discussed with Dr. Angelia Mould

## 2018-01-10 DIAGNOSIS — J189 Pneumonia, unspecified organism: Secondary | ICD-10-CM | POA: Diagnosis not present

## 2018-01-10 DIAGNOSIS — G4733 Obstructive sleep apnea (adult) (pediatric): Secondary | ICD-10-CM | POA: Diagnosis not present

## 2018-01-10 NOTE — Progress Notes (Signed)
Internal Medicine Clinic Attending  Case discussed with Dr. Harbrecht at the time of the visit.  We reviewed the resident's history and exam and pertinent patient test results.  I agree with the assessment, diagnosis, and plan of care documented in the resident's note.   

## 2018-01-14 ENCOUNTER — Ambulatory Visit: Payer: Self-pay | Admitting: *Deleted

## 2018-01-14 ENCOUNTER — Other Ambulatory Visit: Payer: Self-pay | Admitting: *Deleted

## 2018-01-14 NOTE — Patient Outreach (Signed)
[facility]) Care Management  01/14/2018  PEARLE WANDLER [birth year] [id number]   Call placed to member to follow up on current health status.  She report she is doing "ok" but feel she is in need of a nephrology consult.  She continue to verbalize frustration regarding multiple medications taken, stating that she think they are having an effect on her liver and kidneys.  She has continued to be active with MDs (primary, ID, Cardiology, Pulmonology, and Gastro).  Denies that they have expressed any urgent concern, however blood pressure remains elevated.  Member prefers to manager her health conditions independently, but advised of importance of taking medications to better manage blood pressure.  She again expresses concern regarding kidney/liver damage but is open to have [facility] continue to follow for hypertension management.  Will place referral to health coach for disease management.  [facility] CM Care Plan Problem One     Most Recent Value  Care Plan Problem One  Risk for readmission/increased ED visits related to asthma as evidenced by recent hospitalization  Role Documenting the Problem One  Care Management [place] for Problem One  Not Active  [facility] Long Term Goal   Member will not be readmitted to hospital or have any ED visits for asthma within the next 31 days  [facility] Long Term Goal Start Date  11/10/17  [facility] Long Term Goal Met Date  01/14/18  [facility] CM Short Term Goal #1   Member will keep and attend appointments with all MDs within the next week   [facility] CM Short Term Goal #1 Start Date  11/10/17  [facility] CM Short Term Goal #1 Met Date  01/14/18  [facility] CM Short Term Goal #2   Member will report compliance with medications as instructed over the next 4 weeks  [facility] CM Short Term Goal #2 Start Date  11/10/17  [facility] CM Short Term Goal #2 Met Date  01/14/18     Valente David, RN, MSN [place] [phone]

## 2018-01-21 ENCOUNTER — Telehealth: Payer: Self-pay | Admitting: Pulmonary Disease

## 2018-01-21 NOTE — Telephone Encounter (Signed)
Called and spoke to patient. Patient stated that she is planning on starting allergy shots at [facility] but will need a letter giving her medical clearance to do so. Patient has appointment on 6/3 and would like the letter for that visit.   RA please advise. Thank you!

## 2018-01-24 ENCOUNTER — Telehealth: Payer: Self-pay | Admitting: Pulmonary Disease

## 2018-01-24 ENCOUNTER — Encounter: Payer: Self-pay | Admitting: *Deleted

## 2018-01-24 ENCOUNTER — Ambulatory Visit (INDEPENDENT_AMBULATORY_CARE_PROVIDER_SITE_OTHER): Payer: Medicare Other | Admitting: Pulmonary Disease

## 2018-01-24 ENCOUNTER — Encounter: Payer: Self-pay | Admitting: Pulmonary Disease

## 2018-01-24 DIAGNOSIS — J4541 Moderate persistent asthma with (acute) exacerbation: Secondary | ICD-10-CM | POA: Diagnosis not present

## 2018-01-24 DIAGNOSIS — J9611 Chronic respiratory failure with hypoxia: Secondary | ICD-10-CM

## 2018-01-24 NOTE — Assessment & Plan Note (Signed)
Main issue here seems to be significant noncompliance She has been given several samples on his nasal steroids but does not seem to be taking this.  She has various problems with each and every medication that she was given. I emphasized the use of steroid inhaler  Okay to start on allergy shots.  Call me back with the name of a sample that you have after you have tried this for a few days and we will send in prescription (BREO vs TRELEGY )

## 2018-01-24 NOTE — Patient Instructions (Addendum)
Okay to start on allergy shots.  You have to take your steroid inhaler every day!. Call me back with the name of a sample that you have after you have tried this for a few days and we will send in prescription (BREO vs TRELEGY )  Letter for [place] filled out

## 2018-01-24 NOTE — Telephone Encounter (Signed)
Okay to give clearance letter for allergy shots from pulmonary standpoint

## 2018-01-24 NOTE — Telephone Encounter (Signed)
Patient was seen in the office today. She explained that she needed to have two letters filled out. One letter for her to receive allergy injections at [facility], the other for her to resume classes at [facility]. I have both letter printed and signed by RA.   Need to know if she wishes to come back by the office for these or if she wants me to mail them to her. Will need to verify her address. Letters will kept in my flower folder.

## 2018-01-24 NOTE — Telephone Encounter (Signed)
Letter has been written and printed. Handed off to [place] for patient's 11:30 appointment. Nothing further is needed.

## 2018-01-24 NOTE — Assessment & Plan Note (Addendum)
Continue oxygen use during sleep and exertion Letter for [employer] was provided. She was given clearance to proceed with allergy shots

## 2018-01-24 NOTE — Progress Notes (Signed)
   Subjective:    Patient ID: Shannon [name], female    DOB: Jun 01, 1956, 62 y.o.   MRN: [id number]  HPI  62 year old [place] smokerfollowed for chronic severe asthmaand nocturnal hypoxemia on O2 1-2 L/m .  HIV dz f/by ID clinic- CD4 620 01/1442, treatment complicated by lipodystrophy  Meds -  Symbicort makes her gag.  She has various problems with other medications. Prednisone causes weight gain and increase in appetite   she had sinus congestion and was given amoxicillin but she only took Half a tablet because she felt 875 mg was too much She has seen the [facility] allergy and has been advised allergy shots and needs a clearance letter. She needs a letter for [employer] and another letter for her school.  She admits that she is not taking her inhaled steroid medications, I note that in the past she has been given samples of Trelegy and Breo  Skin test reviewed which shows positive to  Trees - oak, [place] tree mix, [place] and [place]     Significant tests/ events reviewed  CTa chest 12/2016 , 01/2017 >neg for PE , multifocal scarring bilaterally PFT (12/04/15) FEV1/FVC 50%, FEV1 0.64 31%.+++BD response. DLCO 66%. Sleep study in 04/2016 was (-) for OSA  Past Medical History:  Diagnosis Date  . Anemia   . Anxiety    HX PANIC ATTACKS  . Arthritis    "starting to; in my hands" (07/09/2015)  . Atrial fibrillation ([place])   . Atrial flutter, paroxysmal ([place])   . Chronic asthma with acute exacerbation    "I have chronic asthma all the time; sometimes exacerbations" (07/09/2015)  . Chronic lower back pain   . Cyst of right kidney    "3 of them; dx'd in ~ 01/2015"  . GERD (gastroesophageal reflux disease)   . Heart murmur   . History of blood transfusion    "related to my brain surgery I think"  . History of pulmonary embolism 07/09/2015  . HIV antibody positive ([place])   . HIV disease ([place])   . Hyperlipidemia   . Hypertension   . Lipodystrophy   . Mild CAD 2013  .  Pneumonia 07/09/2015  . Shingles   . Sleep apnea    "never completed part 2 of study; never wore mask" (07/09/2015)     Review of Systems neg for any significant sore throat, dysphagia, itching, sneezing, nasal congestion or excess/ purulent secretions, fever, chills, sweats, unintended wt loss, pleuritic or exertional cp, hempoptysis, orthopnea pnd or change in chronic leg swelling.  Also denies presyncope, palpitations, heartburn, abdominal pain, nausea, vomiting, diarrhea or change in bowel or urinary habits, dysuria,hematuria, rash, arthralgias, visual complaints, headache, numbness weakness or ataxia.     Objective:   Physical Exam  Gen. Pleasant, obese, in no distress ENT - no lesions, no post nasal drip Neck: No JVD, no thyromegaly, no carotid bruits Lungs: no use of accessory muscles, no dullness to percussion, decreased without rales or rhonchi  Cardiovascular: Rhythm regular, heart sounds  normal, no murmurs or gallops, no peripheral edema Musculoskeletal: No deformities, no cyanosis or clubbing , no tremors       Assessment & Plan:

## 2018-01-25 ENCOUNTER — Other Ambulatory Visit: Payer: Self-pay | Admitting: *Deleted

## 2018-01-25 DIAGNOSIS — B20 Human immunodeficiency virus [HIV] disease: Secondary | ICD-10-CM

## 2018-01-25 NOTE — Telephone Encounter (Signed)
Pt is calling back [phone]

## 2018-01-26 NOTE — Telephone Encounter (Signed)
Pt is calling back [phone]  Pt want's to pick up the letters on 6/6 she doesn't want them mailed. She needs the letters for Monday.

## 2018-01-26 NOTE — Telephone Encounter (Signed)
Spoke with patient. She has decided to have these letters mailed to her due to her having to contact [employer] for transportation. Verbalized address on phone. Advised patient I would place letters in the mail this morning. Nothing else needed at time of call.

## 2018-02-01 ENCOUNTER — Telehealth (HOSPITAL_COMMUNITY): Payer: Self-pay

## 2018-02-01 ENCOUNTER — Other Ambulatory Visit: Payer: Medicare Other

## 2018-02-01 DIAGNOSIS — B20 Human immunodeficiency virus [HIV] disease: Secondary | ICD-10-CM

## 2018-02-01 LAB — COMPLETE METABOLIC PANEL WITH GFR
AG RATIO: 1.6 (calc) (ref 1.0–2.5)
ALT: 15 U/L (ref 6–29)
AST: 20 U/L (ref 10–35)
Albumin: 4.2 g/dL (ref 3.6–5.1)
Alkaline phosphatase (APISO): 74 U/L (ref 33–130)
BILIRUBIN TOTAL: 0.5 mg/dL (ref 0.2–1.2)
BUN/Creatinine Ratio: 16 (calc) (ref 6–22)
BUN: 19 mg/dL (ref 7–25)
CALCIUM: 9.9 mg/dL (ref 8.6–10.4)
CHLORIDE: 103 mmol/L (ref 98–110)
CO2: 32 mmol/L (ref 20–32)
Creat: 1.21 mg/dL — ABNORMAL HIGH (ref 0.50–0.99)
GFR, EST AFRICAN AMERICAN: 55 mL/min/{1.73_m2} — AB (ref >=60)
GFR, Est Non African American: 48 mL/min/{1.73_m2} — ABNORMAL LOW (ref >=60)
GLOBULIN: 2.6 g/dL (ref 1.9–3.7)
Glucose, Bld: 113 mg/dL — ABNORMAL HIGH (ref 65–99)
POTASSIUM: 3.9 mmol/L (ref 3.5–5.3)
SODIUM: 142 mmol/L (ref 135–146)
TOTAL PROTEIN: 6.8 g/dL (ref 6.1–8.1)

## 2018-02-01 NOTE — Telephone Encounter (Signed)
Called to follow up with patient in regards to Pulmonary Rehab - Patient stated she would love to start and will call back this afternoon as she is going to get blood work done. If no phone, will follow up.

## 2018-02-03 ENCOUNTER — Telehealth (HOSPITAL_COMMUNITY): Payer: Self-pay

## 2018-02-03 ENCOUNTER — Encounter

## 2018-02-03 LAB — HIV-1 RNA QUANT-NO REFLEX-BLD
HIV 1 RNA Quant: <20 copies/mL
HIV-1 RNA QUANT, LOG: NOT DETECTED {Log_copies}/mL

## 2018-02-03 NOTE — Telephone Encounter (Signed)
Called patient to follow up in regards to Pulmonary Rehab. Patient stated she is currently at an appt and would like for me to call back this afternoon.

## 2018-02-04 ENCOUNTER — Ambulatory Visit: Payer: Medicare Other | Admitting: Pulmonary Disease

## 2018-02-07 ENCOUNTER — Ambulatory Visit (INDEPENDENT_AMBULATORY_CARE_PROVIDER_SITE_OTHER): Payer: Medicare Other | Admitting: Cardiovascular Disease

## 2018-02-07 ENCOUNTER — Encounter: Payer: Self-pay | Admitting: Cardiovascular Disease

## 2018-02-07 VITALS — BP 162/96 | HR 87 | Ht 63.0 in | Wt 218.2 lb

## 2018-02-07 DIAGNOSIS — E669 Obesity, unspecified: Secondary | ICD-10-CM

## 2018-02-07 DIAGNOSIS — I10 Essential (primary) hypertension: Secondary | ICD-10-CM

## 2018-02-07 DIAGNOSIS — Z5181 Encounter for therapeutic drug level monitoring: Secondary | ICD-10-CM

## 2018-02-07 DIAGNOSIS — R0600 Dyspnea, unspecified: Secondary | ICD-10-CM | POA: Diagnosis not present

## 2018-02-07 DIAGNOSIS — I2510 Atherosclerotic heart disease of native coronary artery without angina pectoris: Secondary | ICD-10-CM

## 2018-02-07 DIAGNOSIS — R300 Dysuria: Secondary | ICD-10-CM

## 2018-02-07 DIAGNOSIS — R109 Unspecified abdominal pain: Secondary | ICD-10-CM

## 2018-02-07 MED ORDER — LISINOPRIL 40 MG PO TABS: 40.0000 mg | ORAL_TABLET | Freq: Every day | ORAL | 3 refills | Status: DC

## 2018-02-07 NOTE — Progress Notes (Signed)
Cardiology Office Note   Date:  02/07/2018   ID:  AHANA NAJERA, DOB 1956/02/20, MRN [id number]  PCP:  [name], MD  Cardiologist:   Skeet Latch, MD  ID: [name], MD  Chief Complaint  Patient presents with  . Follow-up     History of Present Illness: Shannon [name] is a 62 y.o. female with hypertension, asthma, non-obstructive CAD, OSA, prior PE, moderate pulmonary hypertension, mild ascending aorta aneurysm, and HIV (well-controlled)  who presents for follow up.  [name] was initially referred by her infectious disease doctor, [name], MD, on 12/26/15. [name] was concerned about her lower extremity edema and shortness of breath.  She was started on lasix 20 mg every other day.  [name] noted shortness of breath since her hospitalization for pneumonia 06/2015.   She denied lower extremity edema or orthopnea but did report chest pain when laying down at night.  She was referred for an echo 01/2016 that revealed LVEF 60-65% awith grade 1 diastolic dysfunction and hypokinesis of the basal inferior wall.  She also had very mild aortic stenosis with a mean gradient of 9 mmHg and trivial AR.  PASP was 52 mmHg.  She had a Lexiscan Myoview at that time that was negative for ischemia.  [name] previously had a Lexiscan Cardiolite in September 2013 that revealed a medium, mild reversible defect in the anterior wall and normal systolic function. She subsequently underwent cardiac catheterization that revealed nonobstructive coronary disease. She had mild pulmonary hypertension and a normal cardiac output. Left ventriculography revealed mild to moderate aortic regurgitation. She had an echo May 2016 that revealed an ejection fraction of 63% and mild diastolic dysfunction. It also revealed calcification of the aortic valve, mild to moderate aortic regurgitation, and mild aortic stenosis. Peak velocity was 2.1 m/s. She had a 24-hour Holter in February 2015 that revealed rare PACs  and PVCs.  Echo 01/06/17 revealed LVEF 60-65% with mild LVH and a mean aortic valve gradient of 8 mmHg.  Tricuspid regurgitation was not significant enough to assess pulmonary pressures  [name] was admitted to the hospital 01/25/17 with hypertensive emergency and chest pain. Her blood pressure was 215/100.  Her blood pressure in the ED was 185/119. She was again seen in the ED 01/28/17 with shortness of breath and wheezing. Her blood pressure at that time was 190/120. It was noted that she hasn't been taking her medication as prescribed.  HCTZ was discontinued.     [name] has been doing much better with her blood pressure.  She has stopped taking losartan because her blood pressure gets too low and she feels poorly.  She notes that her blood pressure is elevated when she is in pain.  She has a lot of pain from her neck and back.  She was also in the ED 08/25/17 for a URI and on 08/18/17 for R sided chest and neck pain.  Her BP was 172/97 at the time. She continues to also complain of her ganglion cyst, poor healing from an anterior tibia wound, lipodystrophy and skepticism about whether she should change her antiretrovirals as directed by her infectious disease team.  Due to the poor healing of her leg wound she decided to stop taking her anticoagulation.  She also thinks that this is causing her to feel cold.  She struggles with nausea and is not eating well.  She sees her gastroenterologist next week.  She has numbness in the left  side of her face and neck and is following up with urology for this.  She has been very unstable on her feet which she attributes to central adiposity.  She wonders if she could be a candidate for gastric bypass.  She has been referred to see Dr. Leafy Ro.  [name] saw [name], PA-C on 12/2017 and her BP was poorly controlled.  She wasn't taking lasix due to fear it would dehydrate her.  [name] suggested switching to a different ARB or starting a daily diuretic but she declined.  Since her last appointment she has been feeling well.  She saw an allergist who identified several tree allergies.  She shots.  She thinks that this will alleviate many of her symptoms.  Despite stopping losartan.  She did take her carvedilol and nifedipine today.  A lot of carbs because they fill her up without costing much money.  She has no chest pain, lower extremity edema, orthopnea or PND.  She doesn't get much exercise.  Her breathing has been stable.  She complains of bilateral flank pain and mild dysuria.  She had a BMP with her PCP last week that was stable.     Past Medical History:  Diagnosis Date  . Anemia   . Anxiety    HX PANIC ATTACKS  . Arthritis    "starting to; in my hands" (07/09/2015)  . Atrial fibrillation ([place])   . Atrial flutter, paroxysmal ([place])   . Chronic asthma with acute exacerbation    "I have chronic asthma all the time; sometimes exacerbations" (07/09/2015)  . Chronic lower back pain   . Cyst of right kidney    "3 of them; dx'd in ~ 01/2015"  . GERD (gastroesophageal reflux disease)   . Heart murmur   . History of blood transfusion    "related to my brain surgery I think"  . History of pulmonary embolism 07/09/2015  . HIV antibody positive ([place])   . HIV disease ([place])   . Hyperlipidemia   . Hypertension   . Lipodystrophy   . Mild CAD 2013  . Pneumonia 07/09/2015  . Shingles   . Sleep apnea    "never completed part 2 of study; never wore mask" (07/09/2015)    Past Surgical History:  Procedure Laterality Date  . ABDOMINAL HYSTERECTOMY     "robotic laparosopic"  . BRAIN SURGERY  1974   "brain tumor; benign; on top of my brain; got a plate in there"  . CARDIAC CATHETERIZATION    . TONSILLECTOMY AND ADENOIDECTOMY       Current Outpatient Medications  Medication Sig Dispense Refill  . amoxicillin (AMOXIL) 875 MG tablet Take 1 tablet (875 mg total) by mouth 2 (two) times daily. 10 tablet 0  . Ascorbic Acid (VITAMIN C) 1000 MG tablet Take 1,000 mg  by mouth daily.    [name] azithromycin (ZITHROMAX) 250 MG tablet Take by mouth daily.    . bictegravir-emtricitabine-tenofovir AF (BIKTARVY) 50-200-25 MG TABS tablet Take 1 tablet by mouth daily. 30 tablet 11  . BIOTIN PO Take 1 tablet by mouth daily.    [name] CARTIA XT 120 MG 24 hr capsule TAKE 1 CAPSULE(120 MG) BY MOUTH DAILY 90 capsule 0  . carvedilol (COREG) 12.5 MG tablet Take 1 tablet (12.5 mg total) 2 (two) times daily with a meal by mouth. 60 tablet 5  . cholecalciferol (VITAMIN D) 1000 units tablet Take 1,000 Units by mouth daily.    . cyanocobalamin (,VITAMIN B-12,) 1000 MCG/ML injection ADMINISTER  1 ML(1000 MCG) IN THE MUSCLE EVERY 30 DAYS 1 mL 5  . fluticasone (FLONASE) 50 MCG/ACT nasal spray Place 1 spray into both nostrils daily. (Patient taking differently: Place 1 spray into both nostrils daily as needed for allergies or rhinitis. ) 9.9 g 0  . Fluticasone-Umeclidin-Vilant (TRELEGY ELLIPTA) 100-62.5-25 MCG/INH AEPB Inhale 1 puff into the lungs daily. 1 each 0  . Mouthwashes (BIOTENE DRY MOUTH GENTLE) LIQD Use as directed 1 Dose in the mouth or throat 2 (two) times daily. 1 Bottle 0  . Multiple Vitamin (MULTIVITAMIN WITH MINERALS) TABS tablet Take 1 tablet by mouth daily. 90 tablet   . NIFEdipine (ADALAT CC) 90 MG 24 hr tablet Take 1 tablet (90 mg total) by mouth daily. 30 tablet 5  . OXYGEN Inhale 2.5-3 L into the lungs at bedtime as needed (Shortness of Breath). 2.5 to 3 liters at night and as needed in the daytime.     [name] PROAIR HFA 108 (90 Base) MCG/ACT inhaler INHALE 2 PUFFS INTO THE LUNGS EVERY 4 HOURS AS NEEDED FOR WHEEZING OR SHORTNESS OF BREATH 17 g 2  . rivaroxaban (XARELTO) 20 MG TABS tablet Take 1 tablet (20 mg total) by mouth daily with supper. 30 tablet 6  . sodium chloride (OCEAN) 0.65 % SOLN nasal spray Place 1 spray into both nostrils as needed for congestion. 30 mL 1  . Tetrahydrozoline HCl (VISINE OP) Place 2 drops into both eyes daily as needed (for dry eyes).     [name]  lisinopril (PRINIVIL,ZESTRIL) 40 MG tablet Take 1 tablet (40 mg total) by mouth daily. 90 tablet 3  . loratadine (CLARITIN) 10 MG tablet Take 1 tablet (10 mg total) by mouth daily as needed for allergies. 30 tablet 2  . potassium chloride (K-DUR) 10 MEQ tablet Take 1 tablet (10 mEq total) daily by mouth. (Patient taking differently: Take 10 mEq by mouth every other day. ) 30 tablet 5   No current facility-administered medications for this visit.     Allergies:   Tree extract and Ciprofloxacin    Social History:  The patient  reports that she has never smoked. She has never used smokeless tobacco. She reports that she drinks alcohol. She reports that she does not use drugs.   Family History:  The patient's family history includes Asthma in her mother; Diabetes in her sister; Heart failure in her mother; Heart murmur in her brother, sister, and sister; Thyroid disease in her sister.    ROS:  Please see the history of present illness.   Otherwise, review of systems are positive for none   All other systems are reviewed and negative.   PHYSICAL EXAM: VS:  BP (!) 162/96   Pulse 87   Ht 5\' 3"  (1.6 m)   Wt 218 lb 3.2 oz (99 kg)   SpO2 93%   BMI 38.65 kg/m  , BMI Body mass index is 38.65 kg/m. GENERAL:  Well appearing HEENT: Pupils equal round and reactive, fundi not visualized, oral mucosa unremarkable NECK:  No jugular venous distention, waveform within normal limits, carotid upstroke brisk and symmetric, no bruits LUNGS:  Clear to auscultation bilaterally HEART:  RRR.  PMI not displaced or sustained,S1 and S2 within normal limits, no S3, no S4, no clicks, no rubs, II/VI systolic murmur at the LUSB ABD:  Flat, positive bowel sounds normal in frequency in pitch, no bruits, no rebound, no guarding, no midline pulsatile mass, no hepatomegaly, no splenomegaly EXT:  2 plus pulses throughout, no edema,  no cyanosis no clubbing SKIN:  No rashes no nodules.  Lipodystrophy NEURO:  Cranial nerves II  through XII grossly intact, motor grossly intact throughout PSYCH:  Cognitively intact, oriented to person place and time   EKG:  EKG is not ordered today. The ekg ordered 12/31/15 shows sinus rhythm. Rate 89 bpm. 2 PVCs. 10/28/16: Sinus rhythm. Rate 93 bpm 07/01/17: Sinus tachycardia.  Rate 115 bpm.  LAD.    Coronary angiography 06/01/12: 10-20% OM, diffuse 20-30% LAD, 10-20% D1, 20% diffuse RCA RA 10, RV 39/12, RV EDP 12, capillary wedge pressure 19, PA 39/21, mean 27, LV 116/14, LVEDP 20, aorta 115/77 Cardiac output (F[place]) 6.55, cardiac index 3.2 PVR 1.7 with units, SVR 12 with units. She P/QRS ratio 1 LVEF 80%. Aortic root mildly dilated. 1-2+ aortic regurgitation.  24-hour Holter 09/26/13: Sinus rhythm, rare PACs, rare PVCs  Echo 01/22/15: LVEF 70%. Mild LVH. Mild diastolic dysfunction. Normal RV function. Left atrium moderately enlarged. Right atrium mildly enlarged. Trace mitral regurgitation. Calcific aortic valve. Mild to moderate aortic regurgitation. Mild aortic stenosis. Peak velocity 2.1 m/s trace tricuspid regurgitation    Lexiscan Myoview 01/2016:   The left ventricular ejection fraction is normal (55-65%). The EF is 60% visually. The computer generated EF is not calculated correctly and is therefore not reported.  There was no ST segment deviation noted during stress.  The study is normal. no ischemia . no infarction   Echo 01/29/16: Study Conclusions  - Left ventricle: The cavity size was normal. Wall thickness was   increased in a pattern of moderate LVH. Systolic function was   normal. The estimated ejection fraction was in the range of 60%   to 65%. Basal inferior hypokinesis. Doppler parameters are   consistent with abnormal left ventricular relaxation (grade 1   diastolic dysfunction). - Aortic valve: Trileaflet; moderately calcified leaflets. There   was very mild stenosis. There was mild regurgitation. Mean   gradient (S): 9 mm Hg. Valve area (VTI): 1.68 cm^2. -  Mitral valve: There was trivial regurgitation. - Left atrium: The atrium was mildly dilated. - Right ventricle: The cavity size was normal. Systolic function   was normal. - Tricuspid valve: Peak RV-RA gradient (S): 44 mm Hg. - Pulmonary arteries: PA peak pressure: 52 mm Hg (S). - Systemic veins: IVC measured 2.3 cm with > 50% respirophasic   variation, suggesting RA pressure 8 mmHg.  Impressions:  - Normal LV size with moderate LV hypertrophy. EF 60-65%. Basal   inferior hypokinesis. Normal RV size and systolic function. Very   mild aortic stenosis, mild aortic insufficieny. Moderate   pulmonary hypertension.   Recent Labs: 08/25/2017: TSH 0.475 11/02/2017: B Natriuretic Peptide 71.2 12/13/2017: Hemoglobin 12.7; Platelets 214 02/01/2018: ALT 15; BUN 19; Creat 1.21; Potassium 3.9; Sodium 142    Lipid Panel    Component Value Date/Time   CHOL 217 (H) 11/26/2016 1618   TRIG 88 11/26/2016 1618   HDL 67 11/26/2016 1618   CHOLHDL 3.2 11/26/2016 1618   VLDL 18 11/26/2016 1618   LDLCALC 132 (H) 11/26/2016 1618      Wt Readings from Last 3 Encounters:  02/07/18 218 lb 3.2 oz (99 kg)  01/24/18 218 lb (98.9 kg)  01/07/18 222 lb 12.8 oz (101.1 kg)      ASSESSMENT AND PLAN:  # Atrial flutter: Converted with flecainide.  Continue diltiazem, carvedilol and Xarelto.   # Hypertension:  BP is above goal.  Losartan was not replaced when she stopped it due to the  recall.  We will start lisinopril 40mg  daily.  Check BMP in one week. Continue nifedipine, diltiazem, and carvedilol.   # Aortic regurgitation # Aortic stenosis: Ms. Fenstermaker has very mild aortic stenosis (mean gradient 9 mmHg) and mild aortic regurgitation.  Continue to monitor.    # Hyperlipidemia: Repeat lipids and CMP.  She will work on diet and exercise.   # Ascending aorta aneurysm: 4.0 on CT 12/2016.  Will re-assess at follow up.   # Dysuria: Check u/a.  Current medicines are reviewed at length with the patient today.   The patient does not have concerns regarding medicines.  The following changes have been made:  None   Labs/ tests ordered today include:   Orders Placed This Encounter  Procedures  . Urinalysis  . Basic metabolic panel     Disposition:   FU with Besan Ketchem C. Oval Linsey, MD, [facility] in 1 month.      Signed, Harmoni Lucus C. Oval Linsey, MD, [facility]  02/07/2018 7:00 PM    [place]

## 2018-02-07 NOTE — Patient Instructions (Signed)
Medication Instructions:  START LISINOPRIL 40 MG DAILY   Labwork: BMET 1 WEEK   URINALYSIS TODAY   Testing/Procedures: NONE  Follow-Up: Your physician recommends that you schedule a follow-up appointment in: AROUND 4 WEEKS    If you need a refill on your cardiac medications before your next appointment, please call your pharmacy.

## 2018-02-08 ENCOUNTER — Telehealth (HOSPITAL_COMMUNITY): Payer: Self-pay

## 2018-02-08 LAB — URINALYSIS
Bilirubin, UA: NEGATIVE
Glucose, UA: NEGATIVE
KETONES UA: NEGATIVE
LEUKOCYTES UA: NEGATIVE
Nitrite, UA: NEGATIVE
PROTEIN UA: NEGATIVE
RBC UA: NEGATIVE
SPEC GRAV UA: 1.014 (ref 1.005–1.030)
Urobilinogen, Ur: 0.2 mg/dL (ref 0.2–1.0)
pH, UA: 5.5 (ref 5.0–7.5)

## 2018-02-08 NOTE — Telephone Encounter (Signed)
Called patient in regards to Pulmonary Rehab - Scheduled orientation on 03/09/18 at 1:30pm. Patient will attend the 1:30pm exc class. Mailed packet.

## 2018-02-09 ENCOUNTER — Other Ambulatory Visit: Payer: Self-pay | Admitting: Internal Medicine

## 2018-02-09 ENCOUNTER — Other Ambulatory Visit: Payer: Self-pay | Admitting: Pulmonary Disease

## 2018-02-09 DIAGNOSIS — J4540 Moderate persistent asthma, uncomplicated: Secondary | ICD-10-CM

## 2018-02-10 ENCOUNTER — Other Ambulatory Visit: Payer: Self-pay

## 2018-02-10 ENCOUNTER — Other Ambulatory Visit: Payer: Self-pay | Admitting: *Deleted

## 2018-02-10 DIAGNOSIS — G4733 Obstructive sleep apnea (adult) (pediatric): Secondary | ICD-10-CM | POA: Diagnosis not present

## 2018-02-10 DIAGNOSIS — J189 Pneumonia, unspecified organism: Secondary | ICD-10-CM | POA: Diagnosis not present

## 2018-02-10 NOTE — Telephone Encounter (Signed)
Received a refill request from [facility] for patients omeprazole 40mg  capsules. I've called and left her a message to call me as I don't see she is currently on this medicine.

## 2018-02-10 NOTE — Patient Outreach (Addendum)
[facility]) Care Management  02/10/2018  CAROLIN [name] Aug 01, 1956 [id number]   [place] Initial Assessment  Referral Date:  01/18/2018 Referral Source:  Transfer from [place] Reason for Referral:  Continued Disease Management Education Insurance:  [employer]   Outreach Attempt:  Outreach attempt #1 to patient for initial telephone assessment. Line busy.  RN Health Coach unable to leave voice message.  Plan:  RN Health Coach will send unsuccessful outreach letter to patient.  RN Health Coach will make another outreach attempt to patient within 3-4 business days if no return call back from patient.   [place] [phone] [email or web address]

## 2018-02-14 ENCOUNTER — Other Ambulatory Visit: Payer: Self-pay | Admitting: *Deleted

## 2018-02-14 NOTE — Telephone Encounter (Signed)
Patient's phone just rings and rings and no one picks up and no voice mail.

## 2018-02-14 NOTE — Patient Outreach (Signed)
[facility]) Care Management  02/14/2018  Shannon [name] 01/15/1956 [id number]   [place] Initial Assessment  Referral Date:  01/18/2018 Referral Source:  Transfer from [place] Reason for Referral:  Continued Disease Management Education Insurance:  [employer]   Outreach Attempt:  Outreach attempt #2 to patient for initial telephone assessment. No answer and unable to leave voicemail message due to voicemail not engaging.  Plan:  RN Health Coach will make another telephone outreach to patient in the next 10 business days.  [place] [phone] [name]

## 2018-02-14 NOTE — Telephone Encounter (Signed)
These were both just refilled by Dr. Oval Linsey.  Are these from patient or from pharmacy?   Thanks!

## 2018-02-16 ENCOUNTER — Encounter: Payer: Self-pay | Admitting: Internal Medicine

## 2018-02-16 ENCOUNTER — Ambulatory Visit: Payer: Medicare Other | Admitting: Internal Medicine

## 2018-02-16 NOTE — Telephone Encounter (Signed)
I spoke with Angella and she is not taking the omeprazole. This must have been an automatic fax from the pharmacy.

## 2018-02-23 ENCOUNTER — Ambulatory Visit (INDEPENDENT_AMBULATORY_CARE_PROVIDER_SITE_OTHER): Payer: Medicare Other | Admitting: Internal Medicine

## 2018-02-23 ENCOUNTER — Other Ambulatory Visit: Payer: Self-pay

## 2018-02-23 ENCOUNTER — Encounter: Payer: Self-pay | Admitting: Internal Medicine

## 2018-02-23 ENCOUNTER — Ambulatory Visit: Payer: Medicare Other

## 2018-02-23 VITALS — BP 146/96 | HR 79 | Temp 97.6°F | Ht 64.0 in | Wt 223.4 lb

## 2018-02-23 DIAGNOSIS — F419 Anxiety disorder, unspecified: Secondary | ICD-10-CM

## 2018-02-23 DIAGNOSIS — I2510 Atherosclerotic heart disease of native coronary artery without angina pectoris: Secondary | ICD-10-CM | POA: Diagnosis not present

## 2018-02-23 DIAGNOSIS — K5900 Constipation, unspecified: Secondary | ICD-10-CM | POA: Diagnosis not present

## 2018-02-23 DIAGNOSIS — L99 Other disorders of skin and subcutaneous tissue in diseases classified elsewhere: Secondary | ICD-10-CM | POA: Diagnosis not present

## 2018-02-23 DIAGNOSIS — Z79899 Other long term (current) drug therapy: Secondary | ICD-10-CM

## 2018-02-23 DIAGNOSIS — E669 Obesity, unspecified: Secondary | ICD-10-CM

## 2018-02-23 DIAGNOSIS — I10 Essential (primary) hypertension: Secondary | ICD-10-CM

## 2018-02-23 DIAGNOSIS — G4733 Obstructive sleep apnea (adult) (pediatric): Secondary | ICD-10-CM

## 2018-02-23 DIAGNOSIS — B20 Human immunodeficiency virus [HIV] disease: Secondary | ICD-10-CM

## 2018-02-23 DIAGNOSIS — J45909 Unspecified asthma, uncomplicated: Secondary | ICD-10-CM | POA: Diagnosis not present

## 2018-02-23 DIAGNOSIS — Q828 Other specified congenital malformations of skin: Secondary | ICD-10-CM

## 2018-02-23 DIAGNOSIS — Z6841 Body Mass Index (BMI) 40.0 and over, adult: Secondary | ICD-10-CM

## 2018-02-23 LAB — POCT GLYCOSYLATED HEMOGLOBIN (HGB A1C): HEMOGLOBIN A1C: 5.5 % (ref 4.0–5.6)

## 2018-02-23 LAB — GLUCOSE, CAPILLARY: Glucose-Capillary: 75 mg/dL (ref 70–99)

## 2018-02-23 NOTE — Assessment & Plan Note (Signed)
-   Patient states last bowel movement 2~3 weeks ago - Patient had a bowel movement in clinic during visit - States she has tried OTC enema kits in the past which were not effective - Recommend trial of OTC Miralax 17g packet PRN - Counseled on importance of adding fiber to the patient's diet

## 2018-02-23 NOTE — Progress Notes (Signed)
CC: Constipation, Hypertension  HPI: [name] is a 62 y.o. F w/ PMH of undetectable HIV (CD4 670), Asthma, CAD, OSA, and HTN presenting to the clinic for constipation and hypertension management. She states she was concerned about her high blood pressure as she saw that it was consistently over 160/90 during her medical appointments. She denies any headache, nausea, dizziness, chest pain, or palpitations.   She also mentions that she has been bothered with constipation with her last bowel movement 'weeks ago'. She states she is uncomfortable and feels bloated but denies any cramping, abdominal pain, nausea, vomiting. She believes the constipation to be due to her medications and her meds are 'not being cleared out' which is causing her increased anxiety.  Past Medical History:  Diagnosis Date  . Anemia   . Anxiety    HX PANIC ATTACKS  . Arthritis    "starting to; in my hands" (07/09/2015)  . Atrial fibrillation ([place])   . Atrial flutter, paroxysmal ([place])   . Chronic asthma with acute exacerbation    "I have chronic asthma all the time; sometimes exacerbations" (07/09/2015)  . Chronic lower back pain   . Cyst of right kidney    "3 of them; dx'd in ~ 01/2015"  . GERD (gastroesophageal reflux disease)   . Heart murmur   . History of blood transfusion    "related to my brain surgery I think"  . History of pulmonary embolism 07/09/2015  . HIV antibody positive ([place])   . HIV disease ([place])   . Hyperlipidemia   . Hypertension   . Lipodystrophy   . Mild CAD 2013  . Pneumonia 07/09/2015  . Shingles   . Sleep apnea    "never completed part 2 of study; never wore mask" (07/09/2015)    Review of Systems: Review of Systems  Constitutional: Negative for chills, fever, malaise/fatigue and weight loss.  HENT: Positive for congestion and sinus pain. Negative for sore throat.   Eyes: Negative for blurred vision, double vision and pain.  Respiratory: Positive for cough and wheezing.  Negative for shortness of breath and stridor.        States she has had coughing and wheezing due to her asthma ever since childhood  Cardiovascular: Negative for chest pain, palpitations and leg swelling.  Gastrointestinal: Positive for constipation. Negative for abdominal pain, diarrhea, heartburn, nausea and vomiting.  Genitourinary: Negative for dysuria, frequency and urgency.  Skin: Negative for itching and rash.  Neurological: Negative for dizziness, weakness and headaches.  Psychiatric/Behavioral: Negative for depression, substance abuse and suicidal ideas. The patient is nervous/anxious.      Physical Exam:  Vitals:   02/23/18 1323 02/23/18 1330  BP: (!) 189/94 (!) 146/96  Pulse: 79   Temp: 97.6 F (36.4 C)   SpO2: 94%     Physical Exam  Constitutional: She is oriented to person, place, and time. She appears well-developed and well-nourished. No distress.  HENT:  Head: Atraumatic.  Mouth/Throat: Oropharynx is clear and moist.  Eyes: Pupils are equal, round, and reactive to light. Conjunctivae and EOM are normal. No scleral icterus.  Neck: Normal range of motion. Neck supple. No JVD present.  Cardiovascular: Normal rate, regular rhythm, normal heart sounds and intact distal pulses.  Respiratory: Effort normal. No respiratory distress. She has wheezes.  Distant breath sounds  GI: Soft. Bowel sounds are normal. She exhibits no distension. There is no tenderness. There is no guarding.  Musculoskeletal: Normal range of motion. She exhibits no edema, tenderness  or deformity.  Lymphadenopathy:    She has no cervical adenopathy.  Neurological: She is alert and oriented to person, place, and time. She has normal reflexes.  Skin: Skin is warm and dry. No rash noted. She is not diaphoretic.    Assessment & Plan:   See Encounters Tab for problem based charting.  Patient seen with Dr. Dareen Piano   -Gilberto Better, PGY1

## 2018-02-23 NOTE — Assessment & Plan Note (Signed)
-   Pt states she has skin tags on face related to her HIV status - 3~4 1~2 mm diameter skin tags on bilateral cheeks - States her HIV physician at [facility] require referral to derm from PCP - Referral to outpatient dermatology

## 2018-02-23 NOTE — Assessment & Plan Note (Addendum)
-   Pt came in with concerns for her blood pressure as they have been consistently over 160/90 during her visits with other practices - On initial check her blood pressure is 189/94 - Patient states she took her blood pressure medication less than an hour before the appointment - Manual recheck after allowing patient to rest for 20 minutes show blood pressure at 146/96 - Patient states she would like to avoid adding more antihypertensive medication - Counseled patient on low-salt diet and lifestyle modifications

## 2018-02-25 ENCOUNTER — Other Ambulatory Visit: Payer: Self-pay | Admitting: *Deleted

## 2018-02-25 NOTE — Patient Outreach (Signed)
[place] [facility]) Care Management  02/25/2018  ADDYLIN MANKE 04/28/1956 [id number]   Referral Date:01/18/2018 Referral Source:Transfer from [place] Reason for Referral:Continued Disease Management Education Insurance:[employer] Medicare   Outreach Attempt:  Outreach attempt #3 to patient for initial telephone assessment.  Patient answered and started to verify HIPAA.  Prior to complete verification, patient questioned what the telephone call was for and an explanation of [facility] services.  While RN Health Coach describing [facility] services and the Disease Management program; patient stated she needed to take another telephone call due to a recent death in her family.  Patient requested another telephone call back.  Plan:  RN Health Coach will make final initial telephone outreach in the next 3-4 business days.  [place] [phone] [email or web address]

## 2018-02-28 NOTE — Progress Notes (Signed)
Internal Medicine Clinic Attending  I saw and evaluated the patient.  I personally confirmed the key portions of the history and exam documented by Dr. Lee and I reviewed pertinent patient test results.  The assessment, diagnosis, and plan were formulated together and I agree with the documentation in the resident's note.  

## 2018-02-28 NOTE — Addendum Note (Signed)
Addended by: Aldine Contes on: 02/28/2018 09:25 AM   Modules accepted: Level of Service

## 2018-03-02 ENCOUNTER — Other Ambulatory Visit: Payer: Self-pay | Admitting: *Deleted

## 2018-03-02 ENCOUNTER — Ambulatory Visit (INDEPENDENT_AMBULATORY_CARE_PROVIDER_SITE_OTHER): Payer: Medicare Other | Admitting: Internal Medicine

## 2018-03-02 VITALS — BP 148/89 | HR 80 | Temp 98.2°F | Wt 221.0 lb

## 2018-03-02 DIAGNOSIS — B20 Human immunodeficiency virus [HIV] disease: Secondary | ICD-10-CM

## 2018-03-02 DIAGNOSIS — J3089 Other allergic rhinitis: Secondary | ICD-10-CM

## 2018-03-02 DIAGNOSIS — R634 Abnormal weight loss: Secondary | ICD-10-CM | POA: Diagnosis not present

## 2018-03-02 DIAGNOSIS — K5901 Slow transit constipation: Secondary | ICD-10-CM | POA: Diagnosis not present

## 2018-03-02 NOTE — Progress Notes (Signed)
RFV: follow up for hiv disease  Patient ID: Shannon [name], female   DOB: 12/02/1955, 62 y.o.   MRN: [id number]  HPI Shannon [name] is a 62yo F with hiv disease, CD 4 count 670 VL<20, biktarvy with excellent adherence. She states that she is quite fatigued, poor sleep she attributes some of this to seasonal allergies. She is Going to start allergy shots to see if this will help with her of her symptoms. She also wishes for [place] to have her get established with clinic dentist for detal evaluation. No dental pain at this time but feels some of her partial dentures are not fitting well. She has been working on her diet in order to focus on  on weight loss. Also going to pulm rehab next week  Ros: allergies, fatigue, constipation, otherwise 12 point ros is negative Outpatient Encounter Medications as of 03/02/2018  Medication Sig  . Ascorbic Acid (VITAMIN C) 1000 MG tablet Take 1,000 mg by mouth daily.  . bictegravir-emtricitabine-tenofovir AF (BIKTARVY) 50-200-25 MG TABS tablet Take 1 tablet by mouth daily.  [name] BIOTIN PO Take 1 tablet by mouth daily.  . carvedilol (COREG) 12.5 MG tablet Take 1 tablet (12.5 mg total) 2 (two) times daily with a meal by mouth.  . cholecalciferol (VITAMIN D) 1000 units tablet Take 1,000 Units by mouth daily.  . cyanocobalamin (,VITAMIN B-12,) 1000 MCG/ML injection ADMINISTER 1 ML(1000 MCG) IN THE MUSCLE EVERY 30 DAYS  . fluticasone (FLONASE) 50 MCG/ACT nasal spray Place 1 spray into both nostrils daily. (Patient taking differently: Place 1 spray into both nostrils daily as needed for allergies or rhinitis. )  . Fluticasone-Umeclidin-Vilant (TRELEGY ELLIPTA) 100-62.5-25 MCG/INH AEPB Inhale 1 puff into the lungs daily.  [name] lisinopril (PRINIVIL,ZESTRIL) 40 MG tablet Take 1 tablet (40 mg total) by mouth daily.  . Mouthwashes (BIOTENE DRY MOUTH GENTLE) LIQD Use as directed 1 Dose in the mouth or throat 2 (two) times daily.  . OXYGEN Inhale 2.5-3 L into the lungs at bedtime as needed  (Shortness of Breath). 2.5 to 3 liters at night and as needed in the daytime.   [name] PROAIR HFA 108 (90 Base) MCG/ACT inhaler INHALE 2 PUFFS INTO THE LUNGS EVERY 4 HOURS AS NEEDED FOR WHEEZING OR SHORTNESS OF BREATH  . rivaroxaban (XARELTO) 20 MG TABS tablet Take 1 tablet (20 mg total) by mouth daily with supper.  . sodium chloride (OCEAN) 0.65 % SOLN nasal spray Place 1 spray into both nostrils as needed for congestion.  . Tetrahydrozoline HCl (VISINE OP) Place 2 drops into both eyes daily as needed (for dry eyes).   . CARTIA XT 120 MG 24 hr capsule TAKE 1 CAPSULE(120 MG) BY MOUTH DAILY  . loratadine (CLARITIN) 10 MG tablet Take 1 tablet (10 mg total) by mouth daily as needed for allergies.  [name] NIFEdipine (ADALAT CC) 90 MG 24 hr tablet Take 1 tablet (90 mg total) by mouth daily.  . potassium chloride (K-DUR) 10 MEQ tablet Take 1 tablet (10 mEq total) daily by mouth. (Patient taking differently: Take 10 mEq by mouth every other day. )  . [DISCONTINUED] amoxicillin (AMOXIL) 875 MG tablet Take 1 tablet (875 mg total) by mouth 2 (two) times daily. (Patient not taking: Reported on 03/02/2018)  . [DISCONTINUED] azithromycin (ZITHROMAX) 250 MG tablet Take by mouth daily.  . [DISCONTINUED] Multiple Vitamin (MULTIVITAMIN WITH MINERALS) TABS tablet Take 1 tablet by mouth daily. (Patient not taking: Reported on 03/02/2018)   No facility-administered encounter medications on file as of  03/02/2018.      Patient Active Problem List   Diagnosis Date Noted  . Chronic anticoagulation 01/03/2018  . Chronic respiratory failure with hypoxia ([place]) 11/30/2017  . Constipation 11/03/2017  . Allergic rhinitis 05/14/2017  . Atrial flutter ([place]) 03/11/2017  . CAD (coronary artery disease) 01/28/2017  . Obesity (BMI 30-39.9) 01/13/2017  . Ascending aorta dilatation (H[facility]) 01/07/2017  . Low back pain radiating to right lower extremity 12/02/2016  . Bilateral renal cysts 12/02/2016  . Chronic kidney disease (CKD), stage III  (moderate) ([place]) 12/01/2016  . Generalized anxiety disorder 10/22/2016  . Hypersomnia 10/01/2016  . Accessory skin tags 06/10/2016  . Nocturnal hypoxemia 05/13/2016  . Cervical radiculopathy 04/14/2016  . GERD (gastroesophageal reflux disease) 01/02/2016  . Vitamin B12 deficiency 10/02/2015  . Dyspnea 07/09/2015  . Moderate persistent asthma with exacerbation 07/09/2015  . HIV disease ([place]) 07/09/2015  . Uncontrolled hypertension 07/09/2015     Health Maintenance Due  Topic Date Due  . PNEUMOCOCCAL POLYSACCHARIDE VACCINE (1) 03/03/1956  . TETANUS/TDAP  03/03/1973  . COLONOSCOPY  03/03/2004      Physical Exam   BP (!) 148/89   Pulse 80   Temp 98.2 F (36.8 C) (Oral)   Wt 221 lb (100.2 kg)   BMI 37.93 kg/m   Physical Exam  Constitutional:  oriented to person, place, and time. appears well-developed, fatigue appearing and well-nourished. No distress.  HENT: [place]/AT, PERRLA, no scleral icterus Mouth/Throat: Oropharynx is clear and moist. No oropharyngeal exudate.  Cardiovascular: Normal rate, regular rhythm and normal heart sounds. Exam reveals no gallop and no friction rub.  No murmur heard.  Pulmonary/Chest: Effort normal and breath sounds normal. No respiratory distress.  has no wheezes.  Neck = supple, no nuchal rigidity Lymphadenopathy: no cervical adenopathy. No axillary adenopathy Neurological: alert and oriented to person, place, and time.  Skin: Skin is warm and dry. No rash noted. No erythema.  Psychiatric: a normal mood and affect.  behavior is normal.   Lab Results  Component Value Date   CD4TCELL 42 12/13/2017   Lab Results  Component Value Date   CD4TABS 670 12/13/2017   CD4TABS 520 09/02/2017   CD4TABS 620 05/12/2017   Lab Results  Component Value Date   HIV1RNAQUANT <20 NOT DETECTED 02/01/2018   No results found for: HEPBSAB Lab Results  Component Value Date   LABRPR NON-REACTIVE 12/13/2017    CBC Lab Results  Component Value Date   WBC  5.4 12/13/2017   RBC 4.21 12/13/2017   HGB 12.7 12/13/2017   HCT 38.1 12/13/2017   PLT 214 12/13/2017   MCV 90.5 12/13/2017   MCH 30.2 12/13/2017   MCHC 33.3 12/13/2017   RDW 12.5 12/13/2017   LYMPHSABS 1,442 12/13/2017   MONOABS 0.1 11/02/2017   EOSABS 130 12/13/2017    BMET Lab Results  Component Value Date   NA 142 02/01/2018   K 3.9 02/01/2018   CL 103 02/01/2018   CO2 32 02/01/2018   GLUCOSE 113 (H) 02/01/2018   BUN 19 02/01/2018   CREATININE 1.21 (H) 02/01/2018   CALCIUM 9.9 02/01/2018   GFRNONAA 48 (L) 02/01/2018   GFRAA 55 (L) 02/01/2018      Assessment and Plan  Seasonal alleriges = can use zyrtec prn to see if that helps any of her symptoms. I suspect that doing allergy injections will be helpful with some of her symptoms.  Constipation = recommend to use miralax or can also try smooth move [facility] referral Weight loss =  continue with her diet. In part, needs to focus on reducing sugars and carbs with smaller portion sizes hiv = well controlled, continue on biktary

## 2018-03-02 NOTE — Patient Outreach (Addendum)
[place] [facility]) Care Management  03/02/2018  Shannon [name] 1956-08-19 [id number]   [place] Initial Assessment  Referral Date:01/18/2018 Referral Source:Transfer from [place] Reason for Referral:Continued Disease Management Education Insurance:[employer] Medicare   Outreach Attempt:  Outreach attempt #4 to patient for initial telephone assessment.  Patient answered and stated she could not complete initial telephone assessment today.  RN Health Coach requested patient to engage [facility] staff when she was available.  Case will be closed due to inability to engage patient to complete initial telephone assessment.  Plan:  RN Health Coach will close case based on inability to maintain engagement with patient.  Patient encouraged to contact [place] when she is able.  RN Health Coach will send primary care provider case closure letter. RN Health Coach will send patient case closure notice.  [place] [phone] [email or web address]

## 2018-03-04 ENCOUNTER — Telehealth (HOSPITAL_COMMUNITY): Payer: Self-pay

## 2018-03-04 NOTE — Telephone Encounter (Signed)
Attempted to contact patient to confirm appt for 03/09/18 at 1:30pm - cannot leave vm

## 2018-03-09 ENCOUNTER — Ambulatory Visit (HOSPITAL_COMMUNITY): Payer: Medicare Other

## 2018-03-10 ENCOUNTER — Telehealth (HOSPITAL_COMMUNITY): Payer: Self-pay

## 2018-03-10 ENCOUNTER — Ambulatory Visit: Payer: Medicare Other | Admitting: Cardiovascular Disease

## 2018-03-10 NOTE — Telephone Encounter (Signed)
Called patient to see if she would like to reschedule for Pulmonary Rehab. Patient stated she will call back later today to reschedule.

## 2018-03-10 NOTE — Telephone Encounter (Signed)
Patient called back to schedule for Pulmonary Rehab. She will come in for orientation on 04/01/18 @ 1:30PM and will attend the 1:30PM ex class.

## 2018-03-12 DIAGNOSIS — J189 Pneumonia, unspecified organism: Secondary | ICD-10-CM | POA: Diagnosis not present

## 2018-03-12 DIAGNOSIS — G4733 Obstructive sleep apnea (adult) (pediatric): Secondary | ICD-10-CM | POA: Diagnosis not present

## 2018-03-17 ENCOUNTER — Ambulatory Visit: Payer: Medicare Other | Admitting: Cardiovascular Disease

## 2018-03-22 ENCOUNTER — Other Ambulatory Visit: Payer: Self-pay | Admitting: Cardiovascular Disease

## 2018-03-22 ENCOUNTER — Other Ambulatory Visit: Payer: Self-pay | Admitting: Internal Medicine

## 2018-03-23 ENCOUNTER — Ambulatory Visit: Payer: Medicare Other | Admitting: Internal Medicine

## 2018-03-23 ENCOUNTER — Encounter (HOSPITAL_COMMUNITY): Payer: Self-pay

## 2018-03-23 ENCOUNTER — Inpatient Hospital Stay (HOSPITAL_COMMUNITY)
Admission: EM | Admit: 2018-03-23 | Discharge: 2018-03-26 | DRG: 202 | Disposition: A | Discharge status: Home / Self Care | Payer: Medicare Other | Attending: Internal Medicine | Admitting: Internal Medicine

## 2018-03-23 ENCOUNTER — Emergency Department (HOSPITAL_COMMUNITY): Payer: Medicare Other

## 2018-03-23 ENCOUNTER — Other Ambulatory Visit: Payer: Self-pay

## 2018-03-23 DIAGNOSIS — K5900 Constipation, unspecified: Secondary | ICD-10-CM | POA: Diagnosis not present

## 2018-03-23 DIAGNOSIS — N179 Acute kidney failure, unspecified: Secondary | ICD-10-CM | POA: Diagnosis present

## 2018-03-23 DIAGNOSIS — I4892 Unspecified atrial flutter: Secondary | ICD-10-CM | POA: Diagnosis not present

## 2018-03-23 DIAGNOSIS — T461X6A Underdosing of calcium-channel blockers, initial encounter: Secondary | ICD-10-CM | POA: Diagnosis not present

## 2018-03-23 DIAGNOSIS — G4733 Obstructive sleep apnea (adult) (pediatric): Secondary | ICD-10-CM | POA: Diagnosis not present

## 2018-03-23 DIAGNOSIS — Z8249 Family history of ischemic heart disease and other diseases of the circulatory system: Secondary | ICD-10-CM

## 2018-03-23 DIAGNOSIS — I10 Essential (primary) hypertension: Secondary | ICD-10-CM

## 2018-03-23 DIAGNOSIS — G4489 Other headache syndrome: Secondary | ICD-10-CM | POA: Diagnosis not present

## 2018-03-23 DIAGNOSIS — Z91048 Other nonmedicinal substance allergy status: Secondary | ICD-10-CM

## 2018-03-23 DIAGNOSIS — T45516A Underdosing of anticoagulants, initial encounter: Secondary | ICD-10-CM | POA: Diagnosis present

## 2018-03-23 DIAGNOSIS — F419 Anxiety disorder, unspecified: Secondary | ICD-10-CM | POA: Diagnosis not present

## 2018-03-23 DIAGNOSIS — Z7901 Long term (current) use of anticoagulants: Secondary | ICD-10-CM | POA: Diagnosis not present

## 2018-03-23 DIAGNOSIS — J4541 Moderate persistent asthma with (acute) exacerbation: Principal | ICD-10-CM | POA: Diagnosis present

## 2018-03-23 DIAGNOSIS — I2510 Atherosclerotic heart disease of native coronary artery without angina pectoris: Secondary | ICD-10-CM | POA: Diagnosis present

## 2018-03-23 DIAGNOSIS — Z79899 Other long term (current) drug therapy: Secondary | ICD-10-CM | POA: Diagnosis not present

## 2018-03-23 DIAGNOSIS — J45901 Unspecified asthma with (acute) exacerbation: Secondary | ICD-10-CM | POA: Diagnosis present

## 2018-03-23 DIAGNOSIS — Z86711 Personal history of pulmonary embolism: Secondary | ICD-10-CM

## 2018-03-23 DIAGNOSIS — R0789 Other chest pain: Secondary | ICD-10-CM | POA: Diagnosis not present

## 2018-03-23 DIAGNOSIS — Z8619 Personal history of other infectious and parasitic diseases: Secondary | ICD-10-CM

## 2018-03-23 DIAGNOSIS — T50996A Underdosing of other drugs, medicaments and biological substances, initial encounter: Secondary | ICD-10-CM | POA: Diagnosis present

## 2018-03-23 DIAGNOSIS — R0601 Orthopnea: Secondary | ICD-10-CM

## 2018-03-23 DIAGNOSIS — J9621 Acute and chronic respiratory failure with hypoxia: Secondary | ICD-10-CM | POA: Diagnosis not present

## 2018-03-23 DIAGNOSIS — N183 Chronic kidney disease, stage 3 unspecified: Secondary | ICD-10-CM | POA: Diagnosis present

## 2018-03-23 DIAGNOSIS — I4891 Unspecified atrial fibrillation: Secondary | ICD-10-CM | POA: Diagnosis not present

## 2018-03-23 DIAGNOSIS — J309 Allergic rhinitis, unspecified: Secondary | ICD-10-CM | POA: Diagnosis present

## 2018-03-23 DIAGNOSIS — B20 Human immunodeficiency virus [HIV] disease: Secondary | ICD-10-CM | POA: Diagnosis present

## 2018-03-23 DIAGNOSIS — Z9114 Patient's other noncompliance with medication regimen: Secondary | ICD-10-CM | POA: Diagnosis not present

## 2018-03-23 DIAGNOSIS — R609 Edema, unspecified: Secondary | ICD-10-CM | POA: Diagnosis present

## 2018-03-23 DIAGNOSIS — I129 Hypertensive chronic kidney disease with stage 1 through stage 4 chronic kidney disease, or unspecified chronic kidney disease: Secondary | ICD-10-CM | POA: Diagnosis not present

## 2018-03-23 DIAGNOSIS — Z881 Allergy status to other antibiotic agents status: Secondary | ICD-10-CM | POA: Diagnosis not present

## 2018-03-23 DIAGNOSIS — R011 Cardiac murmur, unspecified: Secondary | ICD-10-CM

## 2018-03-23 DIAGNOSIS — J9620 Acute and chronic respiratory failure, unspecified whether with hypoxia or hypercapnia: Secondary | ICD-10-CM | POA: Diagnosis not present

## 2018-03-23 DIAGNOSIS — Z7951 Long term (current) use of inhaled steroids: Secondary | ICD-10-CM

## 2018-03-23 DIAGNOSIS — Z9981 Dependence on supplemental oxygen: Secondary | ICD-10-CM | POA: Diagnosis not present

## 2018-03-23 DIAGNOSIS — Z9119 Patient's noncompliance with other medical treatment and regimen: Secondary | ICD-10-CM

## 2018-03-23 DIAGNOSIS — F410 Panic disorder [episodic paroxysmal anxiety] without agoraphobia: Secondary | ICD-10-CM | POA: Diagnosis present

## 2018-03-23 DIAGNOSIS — Z7952 Long term (current) use of systemic steroids: Secondary | ICD-10-CM | POA: Diagnosis not present

## 2018-03-23 DIAGNOSIS — Z21 Asymptomatic human immunodeficiency virus [HIV] infection status: Secondary | ICD-10-CM

## 2018-03-23 DIAGNOSIS — R079 Chest pain, unspecified: Secondary | ICD-10-CM | POA: Diagnosis not present

## 2018-03-23 DIAGNOSIS — F411 Generalized anxiety disorder: Secondary | ICD-10-CM | POA: Diagnosis present

## 2018-03-23 DIAGNOSIS — R0602 Shortness of breath: Secondary | ICD-10-CM | POA: Diagnosis not present

## 2018-03-23 LAB — BASIC METABOLIC PANEL
Anion gap: 13 (ref 5–15)
BUN: 14 mg/dL (ref 8–23)
CHLORIDE: 98 mmol/L (ref 98–111)
CO2: 30 mmol/L (ref 22–32)
Calcium: 10 mg/dL (ref 8.9–10.3)
Creatinine, Ser: 1.46 mg/dL — ABNORMAL HIGH (ref 0.44–1.00)
GFR calc Af Amer: 43 mL/min — ABNORMAL LOW (ref >60)
GFR calc non Af Amer: 37 mL/min — ABNORMAL LOW (ref >60)
Glucose, Bld: 112 mg/dL — ABNORMAL HIGH (ref 70–99)
Potassium: 3.8 mmol/L (ref 3.5–5.1)
SODIUM: 141 mmol/L (ref 135–145)

## 2018-03-23 LAB — CBC
HEMATOCRIT: 41.1 % (ref 36.0–46.0)
Hemoglobin: 13 g/dL (ref 12.0–15.0)
MCH: 30 pg (ref 26.0–34.0)
MCHC: 31.6 g/dL (ref 30.0–36.0)
MCV: 94.9 fL (ref 78.0–100.0)
Platelets: 184 10*3/uL (ref 150–400)
RBC: 4.33 MIL/uL (ref 3.87–5.11)
RDW: 12.7 % (ref 11.5–15.5)
WBC: 6 10*3/uL (ref 4.0–10.5)

## 2018-03-23 LAB — BRAIN NATRIURETIC PEPTIDE: B NATRIURETIC PEPTIDE 5: 106.5 pg/mL — AB (ref 0.0–100.0)

## 2018-03-23 LAB — I-STAT TROPONIN, ED: Troponin i, poc: 0 ng/mL (ref 0.00–0.08)

## 2018-03-23 MED ORDER — LISINOPRIL 20 MG PO TABS: 40.0000 mg | ORAL_TABLET | Freq: Every day | ORAL | Status: DC

## 2018-03-23 MED ORDER — ALBUTEROL (5 MG/ML) CONTINUOUS INHALATION SOLN
INHALATION_SOLUTION | RESPIRATORY_TRACT | Status: AC
  Filled 2018-03-23: qty 20

## 2018-03-23 MED ORDER — PREDNISONE 20 MG PO TABS
40.0000 mg | ORAL_TABLET | Freq: Every day | ORAL | Status: DC
  Administered 2018-03-23 – 2018-03-24 (×2): 40 mg via ORAL
  Filled 2018-03-23 (×2): qty 2

## 2018-03-23 MED ORDER — DILTIAZEM HCL ER COATED BEADS 120 MG PO CP24
120.0000 mg | ORAL_CAPSULE | Freq: Every day | ORAL | Status: DC
  Administered 2018-03-23 – 2018-03-24 (×2): 120 mg via ORAL
  Filled 2018-03-23 (×3): qty 1

## 2018-03-23 MED ORDER — ALBUTEROL SULFATE (2.5 MG/3ML) 0.083% IN NEBU
2.5000 mg | INHALATION_SOLUTION | RESPIRATORY_TRACT | Status: DC
  Administered 2018-03-23 (×3): 2.5 mg via RESPIRATORY_TRACT
  Filled 2018-03-23 (×5): qty 3

## 2018-03-23 MED ORDER — ALBUTEROL SULFATE (2.5 MG/3ML) 0.083% IN NEBU
2.5000 mg | INHALATION_SOLUTION | Freq: Four times a day (QID) | RESPIRATORY_TRACT | Status: DC
  Administered 2018-03-24: 2.5 mg via RESPIRATORY_TRACT
  Filled 2018-03-23: qty 3

## 2018-03-23 MED ORDER — TETRAHYDROZOLINE HCL 0.05 % OP SOLN: 2.0000 [drp] | Freq: Every day | OPHTHALMIC | Status: DC | PRN

## 2018-03-23 MED ORDER — METHYLPREDNISOLONE SODIUM SUCC 125 MG IJ SOLR
125.0000 mg | Freq: Once | INTRAMUSCULAR | Status: AC
  Administered 2018-03-23: 125 mg via INTRAVENOUS
  Filled 2018-03-23: qty 2

## 2018-03-23 MED ORDER — LACTATED RINGERS IV SOLN
INTRAVENOUS | Status: AC
  Administered 2018-03-23: 15:00:00 via INTRAVENOUS

## 2018-03-23 MED ORDER — DILTIAZEM HCL ER COATED BEADS 120 MG PO CP24: 120.0000 mg | ORAL_CAPSULE | Freq: Every day | ORAL | Status: DC

## 2018-03-23 MED ORDER — LORATADINE 10 MG PO TABS: 10.0000 mg | ORAL_TABLET | Freq: Every day | ORAL | Status: DC | PRN

## 2018-03-23 MED ORDER — ACETAMINOPHEN 650 MG RE SUPP: 650.0000 mg | Freq: Four times a day (QID) | RECTAL | Status: DC | PRN

## 2018-03-23 MED ORDER — FLUTICASONE FUROATE-VILANTEROL 100-25 MCG/INH IN AEPB
1.0000 | INHALATION_SPRAY | Freq: Every day | RESPIRATORY_TRACT | Status: DC
  Administered 2018-03-23 – 2018-03-25 (×3): 1 via RESPIRATORY_TRACT
  Filled 2018-03-23: qty 28

## 2018-03-23 MED ORDER — RIVAROXABAN 20 MG PO TABS
20.0000 mg | ORAL_TABLET | Freq: Every day | ORAL | Status: DC
  Administered 2018-03-23 – 2018-03-25 (×3): 20 mg via ORAL
  Filled 2018-03-23 (×4): qty 1

## 2018-03-23 MED ORDER — VITAMIN D 1000 UNITS PO TABS
1000.0000 [IU] | ORAL_TABLET | Freq: Every day | ORAL | Status: DC
  Administered 2018-03-23 – 2018-03-26 (×4): 1000 [IU] via ORAL
  Filled 2018-03-23 (×4): qty 1

## 2018-03-23 MED ORDER — NIFEDIPINE ER OSMOTIC RELEASE 30 MG PO TB24
90.0000 mg | ORAL_TABLET | Freq: Every day | ORAL | Status: DC
  Administered 2018-03-23 – 2018-03-24 (×2): 90 mg via ORAL
  Filled 2018-03-23: qty 3
  Filled 2018-03-23: qty 1
  Filled 2018-03-23: qty 3

## 2018-03-23 MED ORDER — LORATADINE 10 MG PO TABS
10.0000 mg | ORAL_TABLET | Freq: Every day | ORAL | Status: DC
  Administered 2018-03-23 – 2018-03-26 (×4): 10 mg via ORAL
  Filled 2018-03-23 (×4): qty 1

## 2018-03-23 MED ORDER — MELATONIN 3 MG PO TABS
6.0000 mg | ORAL_TABLET | Freq: Every evening | ORAL | Status: DC | PRN
  Administered 2018-03-26: 6 mg via ORAL
  Filled 2018-03-23 (×3): qty 2

## 2018-03-23 MED ORDER — BISACODYL 10 MG RE SUPP
10.0000 mg | Freq: Once | RECTAL | Status: AC
  Administered 2018-03-23: 10 mg via RECTAL
  Filled 2018-03-23: qty 1

## 2018-03-23 MED ORDER — HYDROXYZINE HCL 50 MG PO TABS: 50.0000 mg | ORAL_TABLET | Freq: Every evening | ORAL | 0 refills | Status: DC | PRN

## 2018-03-23 MED ORDER — BICTEGRAVIR-EMTRICITAB-TENOFOV 50-200-25 MG PO TABS
1.0000 | ORAL_TABLET | Freq: Every day | ORAL | Status: DC
  Administered 2018-03-23 – 2018-03-26 (×4): 1 via ORAL
  Filled 2018-03-23 (×4): qty 1

## 2018-03-23 MED ORDER — HYDRALAZINE HCL 20 MG/ML IJ SOLN
5.0000 mg | INTRAMUSCULAR | Status: DC | PRN
  Administered 2018-03-23: 5 mg via INTRAVENOUS
  Filled 2018-03-23: qty 1

## 2018-03-23 MED ORDER — LISINOPRIL 40 MG PO TABS
40.0000 mg | ORAL_TABLET | Freq: Every day | ORAL | Status: DC
  Administered 2018-03-23 – 2018-03-26 (×4): 40 mg via ORAL
  Filled 2018-03-23 (×2): qty 1
  Filled 2018-03-23: qty 2
  Filled 2018-03-23 (×3): qty 1

## 2018-03-23 MED ORDER — UMECLIDINIUM BROMIDE 62.5 MCG/INH IN AEPB
1.0000 | INHALATION_SPRAY | Freq: Every day | RESPIRATORY_TRACT | Status: DC
  Administered 2018-03-23 – 2018-03-25 (×2): 1 via RESPIRATORY_TRACT
  Filled 2018-03-23: qty 7

## 2018-03-23 MED ORDER — MAGNESIUM SULFATE 2 GM/50ML IV SOLN
2.0000 g | Freq: Once | INTRAVENOUS | Status: AC
  Administered 2018-03-23: 2 g via INTRAVENOUS
  Filled 2018-03-23: qty 50

## 2018-03-23 MED ORDER — BISACODYL 5 MG PO TBEC
5.0000 mg | DELAYED_RELEASE_TABLET | Freq: Once | ORAL | Status: AC
  Administered 2018-03-23: 5 mg via ORAL
  Filled 2018-03-23: qty 1

## 2018-03-23 MED ORDER — SENNOSIDES-DOCUSATE SODIUM 8.6-50 MG PO TABS
1.0000 | ORAL_TABLET | Freq: Every evening | ORAL | Status: DC | PRN
  Administered 2018-03-24: 1 via ORAL
  Filled 2018-03-23: qty 1

## 2018-03-23 MED ORDER — FLUTICASONE-UMECLIDIN-VILANT 100-62.5-25 MCG/INH IN AEPB
1.0000 | INHALATION_SPRAY | Freq: Every day | RESPIRATORY_TRACT | Status: DC
  Administered 2018-03-23: 1 via RESPIRATORY_TRACT

## 2018-03-23 MED ORDER — CARVEDILOL 12.5 MG PO TABS
12.5000 mg | ORAL_TABLET | Freq: Two times a day (BID) | ORAL | Status: DC
  Administered 2018-03-23 – 2018-03-24 (×3): 12.5 mg via ORAL
  Filled 2018-03-23 (×3): qty 1

## 2018-03-23 MED ORDER — NIFEDIPINE ER OSMOTIC RELEASE 90 MG PO TB24: 90.0000 mg | ORAL_TABLET | Freq: Every day | ORAL | Status: DC

## 2018-03-23 MED ORDER — PROMETHAZINE HCL 25 MG PO TABS: 12.5000 mg | ORAL_TABLET | Freq: Four times a day (QID) | ORAL | Status: DC | PRN

## 2018-03-23 MED ORDER — POTASSIUM CHLORIDE CRYS ER 10 MEQ PO TBCR: 10.0000 meq | EXTENDED_RELEASE_TABLET | ORAL | Status: DC

## 2018-03-23 MED ORDER — FLUTICASONE PROPIONATE 50 MCG/ACT NA SUSP
1.0000 | Freq: Every day | NASAL | Status: DC
  Administered 2018-03-24 – 2018-03-26 (×3): 1 via NASAL
  Filled 2018-03-23: qty 16

## 2018-03-23 MED ORDER — ALBUTEROL (5 MG/ML) CONTINUOUS INHALATION SOLN
10.0000 mg/h | INHALATION_SOLUTION | Freq: Once | RESPIRATORY_TRACT | Status: AC
  Administered 2018-03-23: 10 mg/h via RESPIRATORY_TRACT
  Filled 2018-03-23: qty 20

## 2018-03-23 MED ORDER — POLYETHYLENE GLYCOL 3350 17 G PO PACK
17.0000 g | PACK | Freq: Every day | ORAL | Status: DC
  Administered 2018-03-24: 17 g via ORAL
  Filled 2018-03-23 (×3): qty 1

## 2018-03-23 MED ORDER — ACETAMINOPHEN 325 MG PO TABS
650.0000 mg | ORAL_TABLET | Freq: Four times a day (QID) | ORAL | Status: DC | PRN
  Administered 2018-03-23: 650 mg via ORAL
  Filled 2018-03-23: qty 2

## 2018-03-23 NOTE — ED Notes (Signed)
Internal Med paged regarding elevated blood pressures; MD and students came to see patient; MD advised RN to give oral antihypertensives and acceptable BP range for this patient is 751W systolic

## 2018-03-23 NOTE — ED Triage Notes (Signed)
Pt comes via [facility] for headache and CP that has been going on for three days. Pt states she has sinusitis and took some penicillin that she had at home. CP is central with SOB and n/v, radiation to L shoulder wears 2L o2 at home

## 2018-03-23 NOTE — ED Notes (Signed)
PureWick placed on pt. 

## 2018-03-23 NOTE — ED Notes (Signed)
Admitting team at bedside to round.

## 2018-03-23 NOTE — ED Provider Notes (Addendum)
[facility] DEPART[facility] Provider Note   CSN: [id number] Arrival date & time: 03/23/18  0200     History   Chief Complaint Chief Complaint  Patient presents with  . Headache  . Chest Pain    HPI Shannon [name] is a 62 y.o. female.  Patient presents to the emergency department for evaluation of chest pain and shortness of breath.  Symptoms have been ongoing for a couple of weeks.  She has a history of asthma.  She has been using her nebulizers with increasing frequency but has not had complete relief of her shortness of breath.  She thought she might have a sinus infection, took some penicillin that she had at home but it did not help.  She reports that she has been having intermittent substernal chest pain associated with the shortness of breath.  Pain at times radiates to the left shoulder.  No nausea, vomiting, diaphoresis.     Past Medical History:  Diagnosis Date  . Anemia   . Anxiety    HX PANIC ATTACKS  . Arthritis    "starting to; in my hands" (07/09/2015)  . Atrial fibrillation ([place])   . Atrial flutter, paroxysmal ([place])   . Chronic asthma with acute exacerbation    "I have chronic asthma all the time; sometimes exacerbations" (07/09/2015)  . Chronic lower back pain   . Cyst of right kidney    "3 of them; dx'd in ~ 01/2015"  . GERD (gastroesophageal reflux disease)   . Heart murmur   . History of blood transfusion    "related to my brain surgery I think"  . History of pulmonary embolism 07/09/2015  . HIV antibody positive ([place])   . HIV disease ([place])   . Hyperlipidemia   . Hypertension   . Lipodystrophy   . Mild CAD 2013  . Pneumonia 07/09/2015  . Shingles   . Sleep apnea    "never completed part 2 of study; never wore mask" (07/09/2015)    Patient Active Problem List   Diagnosis Date Noted  . Chronic anticoagulation 01/03/2018  . Chronic respiratory failure with hypoxia ([place]) 11/30/2017  . Constipation 11/03/2017  . Allergic  rhinitis 05/14/2017  . Atrial flutter ([place]) 03/11/2017  . CAD (coronary artery disease) 01/28/2017  . Obesity (BMI 30-39.9) 01/13/2017  . Ascending aorta dilatation (H[facility]) 01/07/2017  . Low back pain radiating to right lower extremity 12/02/2016  . Bilateral renal cysts 12/02/2016  . Chronic kidney disease (CKD), stage III (moderate) ([place]) 12/01/2016  . Generalized anxiety disorder 10/22/2016  . Hypersomnia 10/01/2016  . Accessory skin tags 06/10/2016  . Nocturnal hypoxemia 05/13/2016  . Cervical radiculopathy 04/14/2016  . GERD (gastroesophageal reflux disease) 01/02/2016  . Vitamin B12 deficiency 10/02/2015  . Dyspnea 07/09/2015  . Moderate persistent asthma with exacerbation 07/09/2015  . HIV disease ([place]) 07/09/2015  . Uncontrolled hypertension 07/09/2015    Past Surgical History:  Procedure Laterality Date  . ABDOMINAL HYSTERECTOMY     "robotic laparosopic"  . BRAIN SURGERY  1974   "brain tumor; benign; on top of my brain; got a plate in there"  . CARDIAC CATHETERIZATION    . TONSILLECTOMY AND ADENOIDECTOMY       OB History   None      Home Medications    Prior to Admission medications   Medication Sig Start Date End Date Taking? Authorizing Provider  Ascorbic Acid (VITAMIN C) 1000 MG tablet Take 1,000 mg by mouth daily.    [provider]  bictegravir-emtricitabine-tenofovir AF (BIKTARVY) 50-200-25 MG TABS tablet Take 1 tablet by mouth daily. 09/02/17   [name], MD  BIOTIN PO Take 1 tablet by mouth daily.    [provider]  CARTIA XT 120 MG 24 hr capsule TAKE 1 CAPSULE(120 MG) BY MOUTH DAILY 11/22/17   Skeet Latch, MD  carvedilol (COREG) 12.5 MG tablet Take 1 tablet (12.5 mg total) 2 (two) times daily with a meal by mouth. 07/01/17   Skeet Latch, MD  cholecalciferol (VITAMIN D) 1000 units tablet Take 1,000 Units by mouth daily.    [provider]  cyanocobalamin (,VITAMIN B-12,) 1000 MCG/ML injection ADMINISTER 1 ML(1000  MCG) IN THE MUSCLE EVERY 30 DAYS 11/23/17   Bartholomew Crews, MD  fluticasone [facility]) 50 MCG/ACT nasal spray Place 1 spray into both nostrils daily. Patient taking differently: Place 1 spray into both nostrils daily as needed for allergies or rhinitis.  08/18/17   Little, Wenda Overland, MD  Fluticasone-Umeclidin-Vilant (TRELEGY ELLIPTA) 100-62.5-25 MCG/INH AEPB Inhale 1 puff into the lungs daily. 10/05/17   Rigoberto Noel, MD  lisinopril (PRINIVIL,ZESTRIL) 40 MG tablet Take 1 tablet (40 mg total) by mouth daily. 02/07/18 05/08/18  Skeet Latch, MD  loratadine (CLARITIN) 10 MG tablet Take 1 tablet (10 mg total) by mouth daily as needed for allergies. 06/10/16 08/18/17  Alphonzo Grieve, MD  Mouthwashes (BIOTENE DRY MOUTH GENTLE) LIQD Use as directed 1 Dose in the mouth or throat 2 (two) times daily. 09/28/17   Ledell Noss, MD  NIFEdipine (ADALAT CC) 90 MG 24 hr tablet Take 1 tablet (90 mg total) by mouth daily. 02/10/17   Skeet Latch, MD  OXYGEN Inhale 2.5-3 L into the lungs at bedtime as needed (Shortness of Breath). 2.5 to 3 liters at night and as needed in the daytime.     [provider]  potassium chloride (K-DUR) 10 MEQ tablet Take 1 tablet (10 mEq total) daily by mouth. Patient taking differently: Take 10 mEq by mouth every other day.  07/01/17 11/02/17  Skeet Latch, MD  PROAIR HFA 108 [phone] Base) MCG/ACT inhaler INHALE 2 PUFFS INTO THE LUNGS EVERY 4 HOURS AS NEEDED FOR WHEEZING OR SHORTNESS OF BREATH 02/10/18   Rigoberto Noel, MD  rivaroxaban (XARELTO) 20 MG TABS tablet Take 1 tablet (20 mg total) by mouth daily with supper. 05/05/17   [name], MD  sodium chloride (OCEAN) 0.65 % SOLN nasal spray Place 1 spray into both nostrils as needed for congestion. 09/28/17   Ledell Noss, MD  Tetrahydrozoline HCl (VISINE OP) Place 2 drops into both eyes daily as needed (for dry eyes).     [provider]    Family History Family History  Problem Relation Age of Onset  .  Asthma Mother   . Heart failure Mother        cardiomyopathy  . Heart murmur Sister   . Heart murmur Brother   . Diabetes Sister   . Thyroid disease Sister   . Heart murmur Sister     Social History Social History   Tobacco Use  . Smoking status: Never Smoker  . Smokeless tobacco: Never Used  Substance Use Topics  . Alcohol use: Yes    Alcohol/week: 0.0 oz    Comment: Rarely.  . Drug use: No     Allergies   Tree extract and Ciprofloxacin   Review of Systems Review of Systems  HENT: Positive for congestion.   Respiratory: Positive for chest tightness and  shortness of breath.   Cardiovascular: Positive for chest pain.  Neurological: Positive for headaches.  All other systems reviewed and are negative.    Physical Exam Updated Vital Signs BP (!) 194/113 (BP Location: Right Arm)   Pulse 73   Temp 98.7 F (37.1 C) (Oral)   Resp (!) 21   SpO2 100%   Physical Exam  Constitutional: She is oriented to person, place, and time. She appears well-developed and well-nourished. No distress.  HENT:  Head: Normocephalic and atraumatic.  Right Ear: Hearing normal.  Left Ear: Hearing normal.  Nose: Nose normal.  Mouth/Throat: Oropharynx is clear and moist and mucous membranes are normal.  Eyes: Pupils are equal, round, and reactive to light. Conjunctivae and EOM are normal.  Neck: Normal range of motion. Neck supple.  Cardiovascular: Regular rhythm, S1 normal and S2 normal. Exam reveals no gallop and no friction rub.  No murmur heard. Pulmonary/Chest: Accessory muscle usage present. Tachypnea noted. No respiratory distress. She has decreased breath sounds. She has wheezes. She exhibits no tenderness.  Abdominal: Soft. Normal appearance and bowel sounds are normal. There is no hepatosplenomegaly. There is no tenderness. There is no rebound, no guarding, no tenderness at McBurney's point and negative Murphy's sign. No hernia.  Musculoskeletal: Normal range of motion. She  exhibits edema and deformity.  Neurological: She is alert and oriented to person, place, and time. She has normal strength. No cranial nerve deficit or sensory deficit. Coordination normal. GCS eye subscore is 4. GCS verbal subscore is 5. GCS motor subscore is 6.  Skin: Skin is warm, dry and intact. No rash noted. No cyanosis.  Psychiatric: She has a normal mood and affect. Her speech is normal and behavior is normal. Thought content normal.  Nursing note and vitals reviewed.    ED Treatments / Results  Labs (all labs ordered are listed, but only abnormal results are displayed) Labs Reviewed  BASIC METABOLIC PANEL - Abnormal; Notable for the following components:      Result Value   Glucose, Bld 112 (*)    Creatinine, Ser 1.46 (*)    GFR calc non Af Amer 37 (*)    GFR calc Af Amer 43 (*)    All other components within normal limits  CBC  BRAIN NATRIURETIC PEPTIDE  I-STAT TROPONIN, ED    EKG EKG Interpretation  Date/Time:  Wednesday March 23 2018 02:06:17 EDT Ventricular Rate:  80 PR Interval:  166 QRS Duration: 82 QT Interval:  392 QTC Calculation: 452 R Axis:   -21 Text Interpretation:  Normal sinus rhythm Normal ECG Confirmed by Orpah Greek [phone]) on 03/23/2018 3:45:36 AM   Radiology Dg Chest 2 View  Result Date: 03/23/2018 CLINICAL DATA:  Chest pain, shortness of breath EXAM: CHEST - 2 VIEW COMPARISON:  11/02/2017 FINDINGS: Lungs are essentially clear. Mild linear scarring in the left upper lobe, chronic. No pleural effusion or pneumothorax. Cardiomegaly. Mild degenerative changes of the visualized thoracolumbar spine. IMPRESSION: No evidence of acute cardiopulmonary disease. Electronically Signed   By: Julian Hy M.D.   On: 03/23/2018 02:41    Procedures .Critical Care Performed by: Orpah Greek, MD Authorized by: Orpah Greek, MD   Critical care provider statement:    Critical care time (minutes):  30   Critical care time was  exclusive of:  Separately billable procedures and treating other patients   Critical care was necessary to treat or prevent imminent or life-threatening deterioration of the following conditions:  Respiratory failure  Critical care was time spent personally by me on the following activities:  Ordering and performing treatments and interventions, ordering and review of laboratory studies, development of treatment plan with patient or surrogate, discussions with consultants, ordering and review of radiographic studies, pulse oximetry, evaluation of patient's response to treatment, re-evaluation of patient's condition, review of old charts and examination of patient   (including critical care time)  Medications Ordered in ED Medications  magnesium sulfate IVPB 2 g 50 mL (2 g Intravenous New Bag/Given 03/23/18 0435)  albuterol (PROVENTIL,VENTOLIN) solution continuous neb (10 mg/hr Nebulization Given by Other 03/23/18 0437)  methylPREDNISolone sodium succinate (SOLU-MEDROL) 125 mg/2 mL injection 125 mg (125 mg Intravenous Given 03/23/18 0429)     Initial Impression / Assessment and Plan / ED Course  I have reviewed the triage vital signs and the nursing notes.  Pertinent labs & imaging results that were available during my care of the patient were reviewed by me and considered in my medical decision making (see chart for details).     Patient presents to the emergency department for evaluation of difficulty breathing.  Patient has a history of asthma.  She has been using her nebulizers without improvement.  Symptoms have progressed over period of 2 weeks.  She has now developed some chest pain.  No evidence of cardiac ischemia, troponin negative.  Blood work is unremarkable.  Patient has significant bronchospasm, minimal air movement at arrival.  This has started to improve with continuous nebulizer treatment, IV Solu-Medrol, IV magnesium.    Patient hypertensive here in the ER.  She has a history of  chronic hypertension.  Acute elevation likely secondary to her respiratory distress, will need to be addressed during hospitalization.  EKG does not suggest ischemia, first troponin negative.  She will require hospitalization for further treatment.  Final Clinical Impressions(s) / ED Diagnoses   Final diagnoses:  Moderate persistent asthma with acute exacerbation  Essential hypertension    ED Discharge Orders    None       Pollina, Gwenyth Allegra, MD 03/23/18 0502    Orpah Greek, MD 03/23/18 [phone]

## 2018-03-23 NOTE — ED Notes (Signed)
Dr. Danford Bad paged to [place], RN-paged by Levada Dy

## 2018-03-23 NOTE — H&P (Addendum)
Date: 03/23/2018               Patient Name:  Shannon [name] MRN: [id number]  DOB: 1956-05-26 Age / Sex: 62 y.o., female   PCP: [name], MD         Medical Service: Internal Medicine Teaching Service         Attending Physician: Dr. Aldine Contes, MD    First Contact: Dr. Myrtie Hawk Pager: [phone]  Second Contact: Dr. Danford Bad Pager: [phone]       After Hours (After 5p/  First Contact Pager: [phone]  weekends / holidays): Second Contact Pager: [phone]   Chief Complaint: Shortness of breath  History of Present Illness: This is a 62 year old female with a PMH of HIV (CD4 670), asthma (last PFT in 2017), HTN, OSA, and CAD presenting for shortness of breath and chest pain.  She reports having had cold few weeks ago with sinus pain and headache.  She took leftover penicillin for 4 to 5 days. She reports that this did not help but these symptoms have since resolved. She started having worsening shortness of breath around this time and reports using her nebulizers with increasing frequency. Wears 2L O2 at night but has started wearing it during the day.  She reports shortness of breath at night and has not had a good nights sleep in weeks, she has chronic 3 pillow orthopnea and cannot lay flat due to her shortness of breath.  She also reports decreased appetite, some night sweats, generalized abdominal pain, and wheezing.  Denies fever, chills, diarrhea, or cough. She reports that she hasn't been taking some of her asthma and hypertension medications as prescribed. She doesn't take her trelegy every day, her xeralto every day, and she ran out of her nifedipine 4-5 days ago.   Endorses episodes of chest pain for the past 3 to 4 days.  Reports it as a stabbing pain, more on the right side of her chest, lasts for 1 to 2 seconds, worse in the evening while sitting up and worse when constipated and decreased water intake.  She reports that she has had this before when she gets constipated.  No  nausea or diaphoresis with these episodes.  Seen by cardiology on 6/17, plan was to start lisinopril, nifedipine, diltiazem, and carvedilol for her hypertension. She also has a history of atrial flutter that is controlled with diltiazem, carvedilol, and Xarelto. Admitted to the hospital in 01/2017 for hypertensive emergency and chest pain, BP was 215/100.  In the ED she was found to be afebrile, hypertensive to 194/113, RR 20, O2 sat 90% on RA, HR 80s. BMP showed a Cr 1.46, elevated from base line around 1.2. CBC was unremarkable. CXR showed no acute cardiopulmonary disease. EKG showed NSR. She was given albuterol, metyhlprednisoone and magnesium sulfate. Patient was admitted to internal medicine.    Meds:  No current facility-administered medications on file prior to encounter.    Current Outpatient Medications on File Prior to Encounter  Medication Sig Dispense Refill  . Ascorbic Acid (VITAMIN C) 1000 MG tablet Take 1,000 mg by mouth 2 (two) times a week.     . bictegravir-emtricitabine-tenofovir AF (BIKTARVY) 50-200-25 MG TABS tablet Take 1 tablet by mouth daily. 30 tablet 11  . CARTIA XT 120 MG 24 hr capsule TAKE 1 CAPSULE(120 MG) BY MOUTH DAILY 90 capsule 0  . cyanocobalamin (,VITAMIN B-12,) 1000 MCG/ML injection ADMINISTER 1 ML(1000 MCG) IN THE MUSCLE EVERY 30 DAYS  1 mL 5  . BIOTIN PO Take 1 tablet by mouth daily.    . carvedilol (COREG) 12.5 MG tablet Take 1 tablet (12.5 mg total) 2 (two) times daily with a meal by mouth. 60 tablet 5  . cholecalciferol (VITAMIN D) 1000 units tablet Take 1,000 Units by mouth daily.    . fluticasone (FLONASE) 50 MCG/ACT nasal spray Place 1 spray into both nostrils daily. (Patient taking differently: Place 1 spray into both nostrils daily as needed for allergies or rhinitis. ) 9.9 g 0  . Fluticasone-Umeclidin-Vilant (TRELEGY ELLIPTA) 100-62.5-25 MCG/INH AEPB Inhale 1 puff into the lungs daily. 1 each 0  . lisinopril (PRINIVIL,ZESTRIL) 40 MG tablet Take 1  tablet (40 mg total) by mouth daily. 90 tablet 3  . loratadine (CLARITIN) 10 MG tablet Take 1 tablet (10 mg total) by mouth daily as needed for allergies. 30 tablet 2  . Mouthwashes (BIOTENE DRY MOUTH GENTLE) LIQD Use as directed 1 Dose in the mouth or throat 2 (two) times daily. 1 Bottle 0  . NIFEdipine (ADALAT CC) 90 MG 24 hr tablet Take 1 tablet (90 mg total) by mouth daily. 30 tablet 5  . OXYGEN Inhale 2.5-3 L into the lungs at bedtime as needed (Shortness of Breath). 2.5 to 3 liters at night and as needed in the daytime.     . potassium chloride (K-DUR) 10 MEQ tablet Take 1 tablet (10 mEq total) daily by mouth. (Patient taking differently: Take 10 mEq by mouth every other day. ) 30 tablet 5  . PROAIR HFA 108 (90 Base) MCG/ACT inhaler INHALE 2 PUFFS INTO THE LUNGS EVERY 4 HOURS AS NEEDED FOR WHEEZING OR SHORTNESS OF BREATH 8.5 g 0  . rivaroxaban (XARELTO) 20 MG TABS tablet Take 1 tablet (20 mg total) by mouth daily with supper. 30 tablet 6  . sodium chloride (OCEAN) 0.65 % SOLN nasal spray Place 1 spray into both nostrils as needed for congestion. 30 mL 1  . Tetrahydrozoline HCl (VISINE OP) Place 2 drops into both eyes daily as needed (for dry eyes).       Current Meds  Medication Sig  . Ascorbic Acid (VITAMIN C) 1000 MG tablet Take 1,000 mg by mouth 2 (two) times a week.   . bictegravir-emtricitabine-tenofovir AF (BIKTARVY) 50-200-25 MG TABS tablet Take 1 tablet by mouth daily.  [name] CARTIA XT 120 MG 24 hr capsule TAKE 1 CAPSULE(120 MG) BY MOUTH DAILY  . cyanocobalamin (,VITAMIN B-12,) 1000 MCG/ML injection ADMINISTER 1 ML(1000 MCG) IN THE MUSCLE EVERY 30 DAYS     Allergies: Allergies as of 03/23/2018 - Review Complete 03/23/2018  Allergen Reaction Noted  . Tree extract Swelling and Other (See Comments) 04/13/2016  . Ciprofloxacin Hives 07/09/2015   Past Medical History:  Diagnosis Date  . Anemia   . Anxiety    HX PANIC ATTACKS  . Arthritis    "starting to; in my hands"  (07/09/2015)  . Atrial fibrillation ([place])   . Atrial flutter, paroxysmal ([place])   . Chronic asthma with acute exacerbation    "I have chronic asthma all the time; sometimes exacerbations" (07/09/2015)  . Chronic lower back pain   . Cyst of right kidney    "3 of them; dx'd in ~ 01/2015"  . GERD (gastroesophageal reflux disease)   . Heart murmur   . History of blood transfusion    "related to my brain surgery I think"  . History of pulmonary embolism 07/09/2015  . HIV antibody positive ([place])   .  HIV disease ([place])   . Hyperlipidemia   . Hypertension   . Lipodystrophy   . Mild CAD 2013  . Pneumonia 07/09/2015  . Shingles   . Sleep apnea    "never completed part 2 of study; never wore mask" (07/09/2015)    Family History: HTN and thyroid issues run in family. Sister has DM. Mother had enlarged heart.   Social History: Denies tobacco use. Minimal social drinking. Denies drug use. Lives at home alone.   Review of Systems: A complete ROS was negative except as per HPI.   Physical Exam: Blood pressure (!) 193/115, pulse 68, temperature 98.7 F (37.1 C), temperature source Oral, resp. rate 20, SpO2 100 %. General: alert, appears stated age and mild distress HEENT: PERRLA and neck supple with midline trachea, no oral lesions Heart: 2/6 SEM is heard at 2nd right intercostal space, S1 and S2 normal, regular rate and rhythm Lungs: Decreased breath sounds throughout, clear to auscultation and no wheezes or rales Abdomen: abdomen is soft without significant tenderness, masses, organomegaly or guarding Extremities: extremities normal, atraumatic, no cyanosis or edema Musculoskeletal: no joint tenderness, deformity, 1+ edema in left foot Skin:no rashes, no wounds Neurology: normal without focal findings, mental status, speech normal, alert and oriented x3, PERLA and sensation grossly normal Psychiatry: Normal mood and affect  EKG: personally reviewed my interpretation is 80 BPM, normal  sinus rhythm, no acute ST changes.   CXR: personally reviewed my interpretation is no acute pulmonary findings  Assessment & Plan by Problem: This is a 62 year old female with a PMH of HIV (CD4 670), asthma (last PFT in 2017), HTN, OSA, and CAD presenting for shortness of breath and chest pain. Admitted for Asthma exacerbation.   #Asthma exacerbation: Patient has been having worsening shortness of breath, wheezing, and increased oxygen requirements for the past few weeks that worsened in the past couple of days. She has not been taking all of her home asthma medications, skips trelegy and her pro-air some days. She had sinus pressure and headaches a few weeks ago and was recently diagnosed with a tree allergy. On exam in the ED she was found to be hypoxic to around 90% and had bilateral wheezing. CXR showed no acute cardiopulmonary findings. She has had episodes of intermittent chest pain for 3-4 days, stabbing pain that lasts for 1-2 seconds located in the right upper chest. This is likely an acute asthma exacerbation 2/2 medication non-compliance and allergies. She is s/p albuterol, methylprenisolone, and magnesium sulfate while in the ED and reports feeling better.  -Prednisone 40 mg -Oxygen -Duonebs q4 hours -Continue home Trelegy -Continue home fluticasone   #Hypertension: Patient was hypertensive on admission, up to 195/135. Is on lisinopril, nifedipine, diltiazem, and carvedilol for this. She has a history of noncompliance with her medications and has been previously admitted for hypertensive emergency. She did not want to add antihypertensives to her medications on her last clinic visit.  -Continue home medications, nifedipine, lisinopril, and carvedilol, diltiazem -Hydralazine PRN for SBP >180 or DBP >100  #HIV (well controlled): Last HIV RNA on 6/11 was <20 not detected. Last CD4 count on 4/22 was 670. She follows with Infectious disease, last visit was 5/13. She is on biktarvy for this.   -Continue on biktarvy  #Constipation: -History of constipation. Senokot PRN  #Acute on chronic CKD: Cr 1.46, elevated from baseline.  -BMP in AM   #Atrial flutter: Patient is rate controlled.  -Continue home medications -Telemetry  FEN: No fluids, replete  lytes prn, heart healthy diet  VTE ppx: Xeralto Code Status: FULL    Dispo: Admit patient to Observation with expected length of stay less than 2 midnights.  Signed: Asencion Noble, MD 03/23/2018, 6:59 AM  Pager: [phone]

## 2018-03-23 NOTE — Progress Notes (Signed)
   Subjective: Shannon [name] was doing well when seen this AM. She is a little anxious but in no acute distress. She can talk with no shortness of breath. Complains of some left foot swelling since last 1-2 weeks with no pain. Also Complains of constipation and asks for IV fluid.  Objective:  Vital signs in last 24 hours: Vitals:   03/23/18 1000 03/23/18 1015 03/23/18 1030 03/23/18 1045  BP: (!) 201/119 (!) 203/118 (!) 211/108 (!) 180/120  Pulse: 88 90 89 87  Resp: (!) 22 (!) 24 19 20   Temp:      TempSrc:      SpO2: 95% 92% 96% 95%   Ph/E: Alert and oriented. In no acute distress. CV: Nl S1S2, no murmur Lungs: Mild decreased breath sounds bilaterally. No wheeze, rale Abdomen is soft and non tender. Nl BS. Extremities: Non pitting edema at left foot. Non tender. Other wise Nl. Pulses are normal bilaterally.   Assessment/Plan:  62 year old female with a past medical history of HIV, asthma, hypertension, OSA and CAD who presented to the ED with worsening shortness of breath over the last 3 to 4 days.  Asthma exacerbation: Symptoms improved with Albuterol nebulizer and solumedrol at ED. Some general decreased breath sound on exam, lung exam is otherwise unremarkable.  -Continue inhalors, may consider transitioning to [employer] tomorrow -Continue Prednison 40 mg daily for now -O2 as needed  Uncontroled HTN: SBP 190-200 this AM. probable medications non compliance recently. Also has not taken Lasix that prescribed before.  Also seems to be anxiety related.  Home meds resumed and BP came down to 144/90 this PM.  -Continue home meds and hydralazin PRN in hospital -Follow up outpatient for med rec  Non pitting edema: Localized. No pain. No tenderness. Patient is on two, Ca channel blocker blockers.  We recommend med rec  -Follow up at out patient for med rec  Aflutter: Normal sinus in last EKG -Continue Xarelto   Constipation: Patient did not have bowel movement for past few days. No  abdominal pain. She asks for some IV fluid. We explained that she does not need that, she insists and we may put her on slow IV fluid and will monitor her blood pressure  -Bisacodyl 5 mg tab -LR 50 mg/h for 10 h  Dispo: Anticipated discharge in approximately 1day   Dewayne Hatch, MD 03/23/2018, 11:11 AM Pager: [phone]

## 2018-03-24 DIAGNOSIS — N183 Chronic kidney disease, stage 3 (moderate): Secondary | ICD-10-CM | POA: Diagnosis present

## 2018-03-24 DIAGNOSIS — Z86711 Personal history of pulmonary embolism: Secondary | ICD-10-CM | POA: Diagnosis not present

## 2018-03-24 DIAGNOSIS — I10 Essential (primary) hypertension: Secondary | ICD-10-CM

## 2018-03-24 DIAGNOSIS — Z881 Allergy status to other antibiotic agents status: Secondary | ICD-10-CM | POA: Diagnosis not present

## 2018-03-24 DIAGNOSIS — J45901 Unspecified asthma with (acute) exacerbation: Secondary | ICD-10-CM | POA: Diagnosis not present

## 2018-03-24 DIAGNOSIS — Z7952 Long term (current) use of systemic steroids: Secondary | ICD-10-CM

## 2018-03-24 DIAGNOSIS — G4733 Obstructive sleep apnea (adult) (pediatric): Secondary | ICD-10-CM | POA: Diagnosis not present

## 2018-03-24 DIAGNOSIS — B20 Human immunodeficiency virus [HIV] disease: Secondary | ICD-10-CM

## 2018-03-24 DIAGNOSIS — T50996A Underdosing of other drugs, medicaments and biological substances, initial encounter: Secondary | ICD-10-CM | POA: Diagnosis present

## 2018-03-24 DIAGNOSIS — F419 Anxiety disorder, unspecified: Secondary | ICD-10-CM

## 2018-03-24 DIAGNOSIS — Z21 Asymptomatic human immunodeficiency virus [HIV] infection status: Secondary | ICD-10-CM | POA: Diagnosis present

## 2018-03-24 DIAGNOSIS — I2510 Atherosclerotic heart disease of native coronary artery without angina pectoris: Secondary | ICD-10-CM | POA: Diagnosis not present

## 2018-03-24 DIAGNOSIS — I4892 Unspecified atrial flutter: Secondary | ICD-10-CM | POA: Diagnosis not present

## 2018-03-24 DIAGNOSIS — T461X6A Underdosing of calcium-channel blockers, initial encounter: Secondary | ICD-10-CM | POA: Diagnosis present

## 2018-03-24 DIAGNOSIS — J309 Allergic rhinitis, unspecified: Secondary | ICD-10-CM | POA: Diagnosis present

## 2018-03-24 DIAGNOSIS — J9620 Acute and chronic respiratory failure, unspecified whether with hypoxia or hypercapnia: Secondary | ICD-10-CM

## 2018-03-24 DIAGNOSIS — K5900 Constipation, unspecified: Secondary | ICD-10-CM | POA: Diagnosis not present

## 2018-03-24 DIAGNOSIS — N179 Acute kidney failure, unspecified: Secondary | ICD-10-CM | POA: Diagnosis present

## 2018-03-24 DIAGNOSIS — Z9119 Patient's noncompliance with other medical treatment and regimen: Secondary | ICD-10-CM | POA: Diagnosis not present

## 2018-03-24 DIAGNOSIS — F410 Panic disorder [episodic paroxysmal anxiety] without agoraphobia: Secondary | ICD-10-CM | POA: Diagnosis present

## 2018-03-24 DIAGNOSIS — T45516A Underdosing of anticoagulants, initial encounter: Secondary | ICD-10-CM | POA: Diagnosis present

## 2018-03-24 DIAGNOSIS — Z8619 Personal history of other infectious and parasitic diseases: Secondary | ICD-10-CM | POA: Diagnosis not present

## 2018-03-24 DIAGNOSIS — Z9981 Dependence on supplemental oxygen: Secondary | ICD-10-CM | POA: Diagnosis not present

## 2018-03-24 DIAGNOSIS — F411 Generalized anxiety disorder: Secondary | ICD-10-CM | POA: Diagnosis present

## 2018-03-24 DIAGNOSIS — J9621 Acute and chronic respiratory failure with hypoxia: Secondary | ICD-10-CM | POA: Diagnosis present

## 2018-03-24 DIAGNOSIS — I4891 Unspecified atrial fibrillation: Secondary | ICD-10-CM | POA: Diagnosis present

## 2018-03-24 DIAGNOSIS — I129 Hypertensive chronic kidney disease with stage 1 through stage 4 chronic kidney disease, or unspecified chronic kidney disease: Secondary | ICD-10-CM | POA: Diagnosis present

## 2018-03-24 DIAGNOSIS — Z7901 Long term (current) use of anticoagulants: Secondary | ICD-10-CM | POA: Diagnosis not present

## 2018-03-24 DIAGNOSIS — R609 Edema, unspecified: Secondary | ICD-10-CM | POA: Diagnosis present

## 2018-03-24 DIAGNOSIS — J4541 Moderate persistent asthma with (acute) exacerbation: Secondary | ICD-10-CM | POA: Diagnosis present

## 2018-03-24 LAB — CBC
HCT: 38.2 % (ref 36.0–46.0)
Hemoglobin: 12.3 g/dL (ref 12.0–15.0)
MCH: 30.1 pg (ref 26.0–34.0)
MCHC: 32.2 g/dL (ref 30.0–36.0)
MCV: 93.4 fL (ref 78.0–100.0)
PLATELETS: 188 10*3/uL (ref 150–400)
RBC: 4.09 MIL/uL (ref 3.87–5.11)
RDW: 12.7 % (ref 11.5–15.5)
WBC: 8.3 10*3/uL (ref 4.0–10.5)

## 2018-03-24 LAB — BASIC METABOLIC PANEL
Anion gap: 12 (ref 5–15)
BUN: 21 mg/dL (ref 8–23)
CALCIUM: 10 mg/dL (ref 8.9–10.3)
CHLORIDE: 101 mmol/L (ref 98–111)
CO2: 29 mmol/L (ref 22–32)
CREATININE: 1.39 mg/dL — AB (ref 0.44–1.00)
GFR calc non Af Amer: 39 mL/min — ABNORMAL LOW (ref >60)
GFR, EST AFRICAN AMERICAN: 45 mL/min — AB (ref >60)
Glucose, Bld: 224 mg/dL — ABNORMAL HIGH (ref 70–99)
Potassium: 4.3 mmol/L (ref 3.5–5.1)
Sodium: 142 mmol/L (ref 135–145)

## 2018-03-24 MED ORDER — LORAZEPAM 0.5 MG PO TABS
0.5000 mg | ORAL_TABLET | Freq: Three times a day (TID) | ORAL | Status: DC | PRN
  Administered 2018-03-24 – 2018-03-26 (×4): 0.5 mg via ORAL
  Filled 2018-03-24 (×4): qty 1

## 2018-03-24 MED ORDER — PREDNISONE 10 MG PO TABS
30.0000 mg | ORAL_TABLET | Freq: Every day | ORAL | Status: DC
  Administered 2018-03-25 – 2018-03-26 (×2): 30 mg via ORAL
  Filled 2018-03-24 (×2): qty 1

## 2018-03-24 MED ORDER — ALBUTEROL SULFATE (2.5 MG/3ML) 0.083% IN NEBU
2.5000 mg | INHALATION_SOLUTION | Freq: Two times a day (BID) | RESPIRATORY_TRACT | Status: DC
  Administered 2018-03-24 – 2018-03-26 (×4): 2.5 mg via RESPIRATORY_TRACT
  Filled 2018-03-24 (×4): qty 3

## 2018-03-24 MED ORDER — ALBUTEROL SULFATE (2.5 MG/3ML) 0.083% IN NEBU: 2.5000 mg | INHALATION_SOLUTION | RESPIRATORY_TRACT | Status: DC | PRN

## 2018-03-24 MED ORDER — DILTIAZEM HCL ER COATED BEADS 240 MG PO CP24
240.0000 mg | ORAL_CAPSULE | Freq: Every day | ORAL | Status: DC
  Administered 2018-03-25 – 2018-03-26 (×2): 240 mg via ORAL
  Filled 2018-03-24 (×2): qty 1

## 2018-03-24 MED ORDER — NAPHAZOLINE-GLYCERIN 0.012-0.2 % OP SOLN
1.0000 [drp] | Freq: Four times a day (QID) | OPHTHALMIC | Status: DC | PRN
  Filled 2018-03-24: qty 15

## 2018-03-24 MED ORDER — BIOTENE DRY MOUTH MT LIQD: 15.0000 mL | Freq: Three times a day (TID) | OROMUCOSAL | Status: DC

## 2018-03-24 MED ORDER — HYPROMELLOSE (GONIOSCOPIC) 2.5 % OP SOLN
1.0000 [drp] | Freq: Four times a day (QID) | OPHTHALMIC | Status: DC | PRN
  Filled 2018-03-24: qty 15

## 2018-03-24 MED ORDER — HYPROMELLOSE (GONIOSCOPIC) 2.5 % OP SOLN
1.0000 [drp] | Freq: Four times a day (QID) | OPHTHALMIC | Status: DC
  Administered 2018-03-24: 1 [drp] via OPHTHALMIC
  Filled 2018-03-24: qty 15

## 2018-03-24 MED ORDER — DILTIAZEM HCL ER 60 MG PO CP12
120.0000 mg | ORAL_CAPSULE | Freq: Once | ORAL | Status: AC
  Administered 2018-03-24: 120 mg via ORAL
  Filled 2018-03-24: qty 2

## 2018-03-24 NOTE — Progress Notes (Signed)
The patient has declined to take her medication at this time stating that she does not take all of her medication together (oral and respiratory). The patient RN stated that she will give the respiratory medications after the patient eats.

## 2018-03-24 NOTE — Progress Notes (Signed)
Subjective: [name] was doing well this morning, in no acute distress. She was able to talk with no SOB. She informed us she denied her morning medications because she noticed her blood pressure was 124/80 and wanted to talk to her doctor to see if anything needed to be adjusted. She expressed concerns of her blood pressure dropping low in the past that led to her falling. She feels as if she may be taking too many medications. She said her constipation has resolved and she has had bowel movements since yesterday. She also said her left lower extremity swelling has subsided. She reported some dryness of her eyes and mouth.  Objective:  Vital signs in last 24 hours: Vitals:   03/24/18 0856 03/24/18 0858 03/24/18 0945 03/24/18 1005  BP:      Pulse:      Resp:      Temp:      TempSrc:      SpO2: 95% 95% (!) 85% 98%  Weight:      Height:       Physical Exam: General-awake alert and oriented x3, in no acute distress Cardio-RRR, no murmur Lungs- CTA bilaterally, no wheezes Extremities: no lower extremity swelling noted   Assessment/Plan:  Principal Problem:   Acute on chronic respiratory failure (Shannon [name]), secondary to asthma Active Problems:   HIV disease (Shannon [name])   Uncontrolled hypertension   Chronic kidney disease (CKD), stage III (moderate) (HCC)   Allergic rhinitis  [name] is a 62 year old [name] with a medical history of HIV well controlled, asthma, HTN, CAD, and OSA who presented to the ED with worsening SOB over 3-4 days.   Acute on chronic respiratory failure, secondary to asthma: Symptoms improved with albuterol nebulizer and solumedrol in the ED. Lung exam was unremarkable today. She also complained of dry eyes and mouth.  -Continue inhalers  -Decreased prednisone to 30 mg daily  -O2 via nasal cannula as needed  -Artificial tears and biotene for mouth hydration   Uncontrolled HTN: BP was 124/80 this morning on her home medications. She has been non-compliant with her  medications and has not been taking lasix that was prescribed before. After reviewing her records dated as far back as 2016, it seems her BP was not controlled on high doses of nifedipine and losartan. She did not like how she felt on HCTZ and did not take her lasix. She was started on carvedilol in 10/17. Diltiazem was started in 6/18 after an ED admission where she was diagnosed with atrial flutter and she remained on it.   It was unclear whether all of these medications are effective and/or needed due to patient non-compliance. However, her BP has been well controlled with all of her home medications given in the hospital. Due to her non-compliance we recommend maximizing her diltiazem therapy with lisinopril and hydralazine PRN and discontinuing coreg and nifedipine.   -Recommend Diltiazem 240 mg and hydralazine PRN -Continue lisinopril 40 mg -Discontinue Coreg and nifedipine   Aflutter: Her last EKG showed normal sinus  -Continue Xarelto   Anxiety: [name] reported feeling anxious. She takes ativan .5 mg at home for her anxiety.  -Ativan 0.5 mg TID   Non-pitting edema: [name] had left foot swelling yesterday that was localized with no tenderness. Today on exam no lower extremity swelling was noted.  -Monitor for swelling as patient is on two Ca channel blockers   Constipation: [name] reported she had not had a bowel movement for the  past few days but denied any abdominal pain. She asked for IV fluids to flush out her medications which she thinks are the cause of her constipation. It was explained to her she did not need IV fluids; however, she insisted and was started on a slow IV fluid and BP monitoring. Today she reported she had bowel movements since yesterday and is no longer feeling constipated.  -Give bisacodyl 5 mg tablet if constipation continues  -Discontinued LR 50    Dispo: Anticipated discharge in approximately 1 day(s).   Shannon [name] N, DO 03/24/2018, 2:54  PM Pager: [phone]

## 2018-03-24 NOTE — Care Management Obs Status (Signed)
[facility] NOTIFICATION   Patient Details  Name: LEESA LEIFHEIT MRN: [id number] Date of Birth: 19-Jun-1956   Medicare Observation Status Notification Given:  Yes    [place], [place], RN 03/24/2018, 4:09 PM

## 2018-03-24 NOTE — Progress Notes (Signed)
The patient has declined the Incruse

## 2018-03-24 NOTE — Progress Notes (Signed)
Internal Medicine Attending  Date: 03/24/2018  Patient name: Shannon [name] Medical record number: [id number] Date of birth: 09/18/1955 Age: 62 y.o. Gender: female  I saw and evaluated the patient. I reviewed the resident's note by Dr. Laural Golden and I agree with the resident's findings and plans as documented in her progress note.  When seen on rounds this morning Shannon [name] was breathing without any difficulty. Physical examination was notable for bilateral scattered inspiratory crackles without any wheezing and good air movement. Her lower extremities were without edema. She was very concerned about the normalization of her blood pressure which occurred when she took all of her antihypertensive medications as prescribed. Her regimen is somewhat unusual in that she carries a diagnosis of severe asthma as defined by her pulmonologist yet is on a nonselective beta blocker. She also happens to be on 2 nodal agents at relatively low doses to control her heart rate with her underlying a flutter. Finally, she complains of constipation which may be related to both of her calcium channel blockers. Therefore, we agree with her that her medication regimen requires some adjustment. We have increased her diltiazem to 240 mg daily and stopped her carvedilol and nifedipine. We are continuing her lisinopril at 40 mg by daily. We will assess how she does on the diltiazem and lisinopril. If necessary, we can also add a nighttime dose of an alpha-blocker should the 2 medications be ineffective in managing her blood pressure. Finally, if her pulse remains above 110 on just the diltiazam alone this can be further increased to 360 mg by mouth daily if necessary for both rate and blood pressure control. Most of these adjustments are best done in the outpatient setting given her chronic hypertension. She seemed to be excited about these changes and the fact that we were trying to consolidate her medication regimen. We are hopeful, that we  can decrease the risk for asthma exacerbation without nonselective beta-blockade, control her heart rate with a higher dose of calcium channel blockers, and improve her compliance as an outpatient with a more streamlined regimen. As she was somewhat anxious this morning, we also decreased her prednisone from 40 mg daily to 30 mg daily which should be more than adequate to address her acute asthma exacerbation. We will continue this at 30 mg daily for a total steroid dose of 7 days and then discontinue. We will also encourage compliance with her respiratory regimen in order to maintain better control of her asthma. I anticipate she will be stable for discharge home tomorrow with outpatient follow-up to make further adjustments as noted above.

## 2018-03-25 MED ORDER — HYDROXYZINE HCL 25 MG PO TABS: 50.0000 mg | ORAL_TABLET | Freq: Once | ORAL | Status: DC | PRN

## 2018-03-25 NOTE — Care Management Note (Signed)
Case Management Note  Patient Details  Name: Shannon [name] MRN: [id number] Date of Birth: 1956/07/05  Subjective/Objective:   From home lone, presents with asthma ex, pta indep.                  Action/Plan: NCM will follow for transition of care needs.   Expected Discharge Date:                  Expected Discharge Plan:  Home/Self Care  In-House Referral:     Discharge planning Services  CM Consult  Post Acute Care Choice:    Choice offered to:     DME Arranged:    DME Agency:     HH Arranged:    HH Agency:     Status of Service:  In process, will continue to follow  If discussed at Long Length of Stay Meetings, dates discussed:    Additional Comments:  Roza, Creamer, RN 03/25/2018, 11:11 AM

## 2018-03-25 NOTE — Progress Notes (Signed)
   Subjective: [name] is doing well today. She was using the nasal cannula on and off. She reported feeling great on her new BP medication regime. She denied any lightheadedness, dizziness or weakness. She said her breathing felt fine, she was no longer feeling SOB or having trouble. She also stated she is continuing to have bowel movements. She was motivated to get up and walk around more today.  Objective:  Vital signs in last 24 hours: Vitals:   03/25/18 0638 03/25/18 0909 03/25/18 1017 03/25/18 1633  BP: 124/64  119/65 129/74  Pulse:  78 78 82  Resp:  18 (!) 24 (!) 24  Temp:   98.5 F (36.9 C) 98.1 F (36.7 C)  TempSrc:   Oral Oral  SpO2:  93% 94% 94%  Weight:      Height:       Physical exam: General: well developed, well nourished, seen laying in bed in no acute distress Heart: RRR, no murmurs Lungs: CTA bilaterally    Assessment/Plan:  [name] is a 62 year old female with a medical history significant for HIV well controlled, asthma, HTN, CAD, and OSA who presented to the ED with worsening SOB over 3-4 days. She responded well to albuterol, solumedrol and prednisone therapy. We also adjusted her HTN medications due to patient noncompliance as well as her feeling as if she was taking too many medications. We stopped nifedipine and coreg, continuing on diltiazem 240 mg and lisinopril. She is responding well to this regime with no symptoms and excellent blood pressure readings. We plan to monitor her overnight and discharge home tomorrow.   Principal Problem:   Acute on chronic respiratory failure (Shannon [name]), secondary to asthma Active Problems:   HIV disease (Shannon [name])   Uncontrolled hypertension   Generalized anxiety disorder   Chronic kidney disease (CKD), stage III (moderate) (HCC)   Allergic rhinitis   Constipation   Asthma exacerbation   Essential hypertension  Acute on chronic respiratory failure, secondary to asthma: Symptoms have improved. Lung exam was unremarkable  today. She is using nasal cannula on and off but is not having any SOB when she is not using it.  -Continue inhalers -Continue prednisone 30 mg for a total course of 7 days -Continue artificial tears, visine and biotene for hydration  Uncontrolled HTN: We discontinued nifedipine and coreg. She is responding well on just diltiazem and lisinopril. Her BP readings today were 124/64, 119/65, 129/74. She is not feeling any symptoms of lightheadedness, dizziness, or weakness.   -Continue taking only diltiazem and lisinopril  -Discontinue coreg and nifedipine  -Follow up in clinic    Dispo: Anticipated discharge in approximately 1 day(s).   Mike Craze, DO 03/25/2018, 6:07 PM Pager: [phone]

## 2018-03-25 NOTE — Progress Notes (Signed)
Internal Medicine Attending  Date: 03/25/2018  Patient name: Shannon [name] Medical record number: [id number] Date of birth: Nov 08, 1955 Age: 62 y.o. Gender: female  I saw and evaluated the patient. I reviewed the resident's note by Dr. Laural Golden and I agree with the resident's findings and plans as documented in her progress note.  When seen on rounds this morning Shannon [name] was feeling well. She was delighted with her blood pressure control on just 2 medications. Her breathing was also excellent and she denied any wheezing or dyspnea. She will be discharged home tomorrow morning. Follow-up will be in the [facility].

## 2018-03-26 MED ORDER — HYDROXYZINE HCL 50 MG PO TABS: 50.0000 mg | ORAL_TABLET | Freq: Every evening | ORAL | 0 refills | Status: DC | PRN

## 2018-03-26 MED ORDER — FLUTICASONE FUROATE-VILANTEROL 100-25 MCG/INH IN AEPB: 1.0000 | INHALATION_SPRAY | Freq: Every day | RESPIRATORY_TRACT | 1 refills | Status: DC

## 2018-03-26 MED ORDER — LORAZEPAM 0.5 MG PO TABS: 0.5000 mg | ORAL_TABLET | Freq: Three times a day (TID) | ORAL | 0 refills | Status: DC | PRN

## 2018-03-26 MED ORDER — PREDNISONE 10 MG PO TABS: 30.0000 mg | ORAL_TABLET | Freq: Every day | ORAL | 0 refills | Status: AC

## 2018-03-26 MED ORDER — FLUTICASONE PROPIONATE 50 MCG/ACT NA SUSP: 1.0000 | Freq: Every day | NASAL | 0 refills | Status: DC

## 2018-03-26 MED ORDER — UMECLIDINIUM BROMIDE 62.5 MCG/INH IN AEPB: 1.0000 | INHALATION_SPRAY | Freq: Every day | RESPIRATORY_TRACT | 1 refills | Status: DC

## 2018-03-26 MED ORDER — ALUM & MAG HYDROXIDE-SIMETH 200-200-20 MG/5ML PO SUSP: 15.0000 mL | Freq: Once | ORAL | Status: DC

## 2018-03-26 MED ORDER — POLYETHYLENE GLYCOL 3350 17 G PO PACK: 17.0000 g | PACK | Freq: Every day | ORAL | 0 refills | Status: DC

## 2018-03-26 MED ORDER — DILTIAZEM HCL ER COATED BEADS 240 MG PO CP24: 240.0000 mg | ORAL_CAPSULE | Freq: Every day | ORAL | 0 refills | Status: DC

## 2018-03-26 MED ORDER — SENNOSIDES-DOCUSATE SODIUM 8.6-50 MG PO TABS: 1.0000 | ORAL_TABLET | Freq: Every evening | ORAL | 0 refills | Status: DC | PRN

## 2018-03-26 MED ORDER — ALBUTEROL SULFATE HFA 108 (90 BASE) MCG/ACT IN AERS: INHALATION_SPRAY | RESPIRATORY_TRACT | 0 refills | Status: DC

## 2018-03-26 NOTE — Discharge Instructions (Signed)
Shannon [name], Shannon [name] were admitted for treatment of an asthma exacerbation and also medication management. I'm glad you are feeling better on your new inhaler regimen and steroids. You are being discharged home with 3 additional days of steroids which you will start tomorrow morning (8/4). Because you used your previous Trelegy inhaler rarely due to side effects, we will discharge you with the inhalers you were doing well with during your hospital stay. It is important however that you EITHER use the new inhalers (Breo, Incruse) OR Trelegy (not all 3).   We made many changes to your blood pressure regimen which seems to be working very well. You are now to take Diltiazem 240mg  daily and Lisinopril 40mg  daily but please STOP Nifidipine, Diltiazem 120mg  and Coreg!!!  Please ensure you have follow-up in the internal medicine clinic and your allergist.

## 2018-03-26 NOTE — Progress Notes (Addendum)
   Subjective: [name] was seen laying in bed this morning and reported feeling well. She is still having some dryness of her eyes. She was able to get up and move around yesterday and also had a bowel movement. Her breathing has significantly improved on therapy and lung exam was unremarkable. She is tolerating her new medication regime well with blood pressures well in target. She is not experiencing any lightheadedness or weakness. She understands the need to continue taking these meds at home. She is ready for discharge today.  Objective:  Vital signs in last 24 hours: Vitals:   03/25/18 2117 03/25/18 2300 03/26/18 0756 03/26/18 0905  BP:  132/79 128/80   Pulse:  85 74   Resp:  (!) 24 (!) 22   Temp:  98 F (36.7 C)    TempSrc:  Oral    SpO2: 94% 91% 97% 96%  Weight:      Height:       Physical exam:  General: well nourished well developed, seen laying in bed in no acute distress Cardio: RRR, no murmurs Lungs: CTA bilaterally  Extremities: no LE edema  Assessment/Plan:  [name] is a 62 year old female with a medical history significant for HIV well controlled, asthma, HTN, CAD, and OSA who presented to the ED with worsening SOB over 3-4 days. She responded well to albuterol, solumedrol and prednisone therapy. We also adjusted her HTN medications due to patient noncompliance as well as her feeling as if she was taking too many medications. We stopped nifedipine and coreg, continuing on diltiazem 240 mg and lisinopril. She is responding well to this regime with no symptoms and excellent blood pressure readings.    Principal Problem:   Acute on chronic respiratory failure (Shannon [name]), secondary to asthma Active Problems:   HIV disease ([place])   Uncontrolled hypertension   Generalized anxiety disorder   Chronic kidney disease (CKD), stage III (moderate) (HCC)   Allergic rhinitis   Constipation   Asthma exacerbation   Essential hypertension  Acute on chronic respiratory failure,  secondary to asthma: Symptoms have improved. Lung exam was unremarkable today.   -Continue inhalers -Continue prednisone 30 mg for a total course of 7 days -Continue artificial tears and visine for eye hydration  Uncontrolled HTN: We discontinued nifedipine and coreg. She is responding well on just diltiazem and lisinopril. Her BP readings today were 132/79 and 128/80. She is not feeling any symptoms of lightheadedness, dizziness, or weakness.   -Continue taking only diltiazem and lisinopril  -Discontinue coreg and nifedipine  -Follow up in clinic     Dispo: [name] is medically stable for discharge today.   Mike Craze, DO 03/26/2018, 9:53 AM Pager: [phone]

## 2018-03-26 NOTE — Discharge Summary (Signed)
Name: Shannon [name] MRN: [id number] DOB: 1956-06-06 62 y.o. PCP: [name], MD  Date of Admission: 03/23/2018  2:01 AM Date of Discharge: 03/26/2018 Attending Physician: Oval Linsey, MD  Discharge Diagnosis: 1. Acute on chronic respiratory failure secondary to asthma 2. Uncontrolled HTN 3. Aflutter 4. Anxiety 5. Constipation  Discharge Medications: Allergies as of 03/26/2018      Reactions   Tree Extract Swelling, Other (See Comments)   Swelling to eyes   Ciprofloxacin Hives      Medication List    STOP taking these medications   carvedilol 12.5 MG tablet Commonly known as:  COREG   Fluticasone-Umeclidin-Vilant 100-62.5-25 MCG/INH Aepb Commonly known as:  TRELEGY ELLIPTA   NIFEdipine 90 MG 24 hr tablet Commonly known as:  ADALAT CC     TAKE these medications   albuterol 108 (90 Base) MCG/ACT inhaler Commonly known as:  PROAIR HFA INHALE 2 PUFFS INTO THE LUNGS EVERY 4 HOURS AS NEEDED FOR WHEEZING OR SHORTNESS OF BREATH   aspirin 81 MG chewable tablet Chew 162 mg by mouth once as needed (for onset of chest pain).   azelastine 0.05 % ophthalmic solution Commonly known as:  OPTIVAR Place 2 drops into both eyes 2 (two) times daily as needed (for swelling or itching).   bictegravir-emtricitabine-tenofovir AF 50-200-25 MG Tabs tablet Commonly known as:  BIKTARVY Take 1 tablet by mouth daily.   BIOTENE DRY MOUTH GENTLE Liqd Use as directed 1 Dose in the mouth or throat 2 (two) times daily.   BIOTIN PO Take 1 tablet by mouth daily as needed (for supplementation).   cholecalciferol 1000 units tablet Commonly known as:  VITAMIN D Take 1,000 Units by mouth 2 (two) times a week.   cyanocobalamin 1000 MCG/ML injection Commonly known as:  (VITAMIN B-12) ADMINISTER 1 ML(1000 MCG) IN THE MUSCLE EVERY 30 DAYS   diltiazem 240 MG 24 hr capsule Commonly known as:  CARDIZEM CD Take 1 capsule (240 mg total) by mouth daily. Start taking on:  03/27/2018 What  changed:    medication strength  See the new instructions.   fluticasone 50 MCG/ACT nasal spray Commonly known as:  FLONASE Place 1 spray into both nostrils daily. What changed:    when to take this  reasons to take this   fluticasone furoate-vilanterol 100-25 MCG/INH Aepb Commonly known as:  BREO ELLIPTA Inhale 1 puff into the lungs daily. Do not take if also taking Trelegy Start taking on:  03/27/2018   hydrOXYzine 50 MG tablet Commonly known as:  ATARAX/VISTARIL Take 1 tablet (50 mg total) by mouth at bedtime as needed for anxiety (or insomnia).   lisinopril 40 MG tablet Commonly known as:  PRINIVIL,ZESTRIL Take 1 tablet (40 mg total) by mouth daily.   loratadine 10 MG tablet Commonly known as:  CLARITIN Take 1 tablet (10 mg total) by mouth daily as needed for allergies. What changed:  when to take this   LORazepam 0.5 MG tablet Commonly known as:  ATIVAN Take 1 tablet (0.5 mg total) by mouth 3 (three) times daily as needed for anxiety.   Melatonin 5 MG Tabs Take 5 mg by mouth at bedtime as needed (for sleep).   OXYGEN Inhale 1.5-2 L into the lungs continuous.   polyethylene glycol packet Commonly known as:  MIRALAX / GLYCOLAX Take 17 g by mouth daily. Start taking on:  03/27/2018   potassium chloride 10 MEQ tablet Commonly known as:  K-DUR Take 1 tablet (10 mEq total) daily by  mouth. What changed:  when to take this   predniSONE 10 MG tablet Commonly known as:  DELTASONE Take 3 tablets (30 mg total) by mouth daily with breakfast for 3 days. Start taking on:  03/27/2018   PREVIDENT 5000 SENSITIVE 1.1-5 % Pste Generic drug:  Sod Fluoride-Potassium Nitrate Use daily as directed   rivaroxaban 20 MG Tabs tablet Commonly known as:  XARELTO Take 1 tablet (20 mg total) by mouth daily with supper.   senna-docusate 8.6-50 MG tablet Commonly known as:  Senokot-S Take 1 tablet by mouth at bedtime as needed for mild constipation.   sodium chloride 0.65 % Soln  nasal spray Commonly known as:  OCEAN Place 1 spray into both nostrils as needed for congestion.   umeclidinium bromide 62.5 MCG/INH Aepb Commonly known as:  INCRUSE ELLIPTA Inhale 1 puff into the lungs daily. Do not take if also taking Trelegy Start taking on:  03/27/2018   VISINE OP Place 2 drops into both eyes daily as needed (for dry eyes).   vitamin C 1000 MG tablet Take 1,000 mg by mouth 2 (two) times a week.       Disposition and follow-up:   [name] was discharged from [facility] in Stable condition.  At the hospital follow up visit please address:  1.  BP medications- reassess BP on diltiazem and lisinopril therapy and see if any more adjustments need to be made.  2.  Labs / imaging needed at time of follow-up: none  3.  Pending labs/ test needing follow-up: none  Follow-up Appointments:   [name] MD [address] [place] [address]  [phone]  Hospital Course by problem list: 1. Acute on chronic respiratory failure secondary to asthma- Ms. Bidinger came in with a history of 3-4 days of SOB and wheezing. She immediately felt better after albuterol and solumedrol was administered in the ED. Her CXR was insignificant. She was continued on albuterol, Breo, Trelegy, and prednisone 30 mg (initially on 40 mg). Her respiratory function significantly improved. 2. Uncontrolled HTN- Ms. Tom has a long history of uncontrolled HTN and Aflutter. She was taking nifedipine, diltiazem, lisinopril and coreg. She expressed concerns over taking too many medications. We discontinued nifedipine and coreg, and monitored her on just diltiazem and lisinopril. Her BP readings were excellent and in target range.  3. Aflutter- Ms. Schonberger's EKG showed normal sinus and we continued her on xarelto. 4. Anxiety-Ms. Akey was experiencing anxiety during her admission and we continued her home dose of 0.5 mg ativan with improvement of symptoms. 5. Constipation- Ms. Gracie  reported she was not having bowel movements for the past couple of days on admission. She asked for IV fluids to flush out medications which she thought were contributing to the constipation. We explained IV fluids were not necessary and due to her HTN; however, she insisted so we started on her a slow drip and and gave her one dose of bisacodyl. She reported her BM had improved.   Discharge Vitals:   BP 128/80 (BP Location: Left Arm)   Pulse 74   Temp 98 F (36.7 C) (Oral)   Resp (!) 22   Ht 5\' 3"  (1.6 m)   Wt 230 lb 6.1 oz (104.5 kg)   SpO2 96%   BMI 40.81 kg/m   Pertinent Labs, Studies, and Procedures:   Vitals:   03/26/18 0756 03/26/18 0905  BP: 128/80   Pulse: 74   Resp: (!) 22  Temp:    SpO2: 97% 96%   Dg Chest 2 View  Result Date: 03/23/2018 CLINICAL DATA:  Chest pain, shortness of breath EXAM: CHEST - 2 VIEW COMPARISON:  11/02/2017 FINDINGS: Lungs are essentially clear. Mild linear scarring in the left upper lobe, chronic. No pleural effusion or pneumothorax. Cardiomegaly. Mild degenerative changes of the visualized thoracolumbar spine. IMPRESSION: No evidence of acute cardiopulmonary disease. Electronically Signed   By: Julian Hy M.D.   On: 03/23/2018 02:41    Discharge Instructions: Continue taking inhalers. Continue taking diltiazem and lisinopril but discontinue coreg and nifedipine. Follow up with PCP.   SignedMike Craze, DO 03/26/2018, 1:24 PM   Pager: [phone]

## 2018-03-26 NOTE — Progress Notes (Signed)
Internal Medicine Attending  Date: 03/26/2018  Patient name: Shannon [name] Medical record number: [id number] Date of birth: [birth year] Age: 62 y.o. Gender: female  I saw and evaluated the patient. I reviewed the resident's note by Dr. Laural Golden and I agree with the resident's findings and plans as documented in her progress note.  When seen on rounds morning [name] was lying comfortably in bed in no distress. Her breathing is subjectively markedly improved from admission per her report. She is quite happy with her antihypertensive regimen and is tolerating it well. Her pulses also have been well controlled on the diltiazem 240 mg by mouth daily. She is stable for discharge home today with follow-up in her cardiologist's office, as well as with her primary care physician Dr. Daryll Drown.

## 2018-03-28 ENCOUNTER — Encounter (INDEPENDENT_AMBULATORY_CARE_PROVIDER_SITE_OTHER): Payer: Medicare Other

## 2018-03-29 ENCOUNTER — Ambulatory Visit: Payer: Medicare Other | Admitting: Pulmonary Disease

## 2018-04-01 ENCOUNTER — Ambulatory Visit (HOSPITAL_COMMUNITY): Payer: Medicare Other

## 2018-04-01 ENCOUNTER — Telehealth (HOSPITAL_COMMUNITY): Payer: Self-pay | Admitting: *Deleted

## 2018-04-01 NOTE — Telephone Encounter (Signed)
Reviewed medical history for upcoming pulmonary rehab orientation appt.  Noted pt with hospitalization for chronic respiratory failure secondary to asthma.  Pt discharged on 8/3.  Pt had a previously scheduled appt with Dr. Elsworth Soho on 8

## 2018-04-01 NOTE — Telephone Encounter (Signed)
Continuation of telephone note:  appt on 8/6 with Dr. Elsworth Soho.  Due to pt just getting out of the hospital she did not feel up to going and cancelled the appt.  Pt will however see Tammy Parrett in the office in follow up on 8/15.  Advised pt that she would need to be seen prior to coming in for pulmonary orientation.  Will review office note for readiness to proceed with rescheduling for pulmonary rehab orientation.  Pt thankful for the call and verbalized understanding.  Cherre Huger, BSN Cardiac and Training and development officer

## 2018-04-06 ENCOUNTER — Ambulatory Visit: Payer: Medicare Other | Admitting: Internal Medicine

## 2018-04-07 ENCOUNTER — Inpatient Hospital Stay: Payer: Medicare Other | Admitting: Nurse Practitioner

## 2018-04-07 ENCOUNTER — Inpatient Hospital Stay: Payer: Medicare Other | Admitting: Adult Health

## 2018-04-08 ENCOUNTER — Telehealth (HOSPITAL_COMMUNITY): Payer: Self-pay

## 2018-04-08 NOTE — Telephone Encounter (Signed)
Attempted to follow up with patient in regards to f/u appt and pulmonary rehab - lm on vm

## 2018-04-12 ENCOUNTER — Telehealth (HOSPITAL_COMMUNITY): Payer: Self-pay

## 2018-04-12 DIAGNOSIS — J189 Pneumonia, unspecified organism: Secondary | ICD-10-CM | POA: Diagnosis not present

## 2018-04-12 DIAGNOSIS — G4733 Obstructive sleep apnea (adult) (pediatric): Secondary | ICD-10-CM | POA: Diagnosis not present

## 2018-04-12 NOTE — Telephone Encounter (Signed)
Called and spoke with patient in regards to Pulmonary Rehab - Patient stated she is really interested in the program and is eager to get started. I explained to patient that she will need to reschedule her 04/07/18 f/u appt with Dr.Alva before we can proceed with scheduling. Patient verbalized understanding. Patient stated she is going to the grocery store now and once she gets back home she will call Dr.Alva to reschedule. Patient will call with appt date. Will place referral in appt bin once appt has been scheduled.

## 2018-04-19 ENCOUNTER — Telehealth (HOSPITAL_COMMUNITY): Payer: Self-pay

## 2018-04-19 NOTE — Telephone Encounter (Signed)
Attempt to call patient in regards to Pulmonary Rehab - Unable to LM

## 2018-04-21 NOTE — Telephone Encounter (Signed)
Called patient in regards to Pulmonary Rehab, pt states she has a f/u appt schedule to see Dr. Elsworth Soho on 9/18. Adv pt she would have to 1st complete that appt before rescheduling. She stated she will give Dr. Elsworth Soho office a call to see if she can get in sooner.  Will follow up with pt.

## 2018-04-21 NOTE — Addendum Note (Signed)
Addended by: Hulan Fray on: 04/21/2018 06:14 PM   Modules accepted: Orders

## 2018-04-26 ENCOUNTER — Other Ambulatory Visit: Payer: Self-pay

## 2018-04-26 ENCOUNTER — Ambulatory Visit (INDEPENDENT_AMBULATORY_CARE_PROVIDER_SITE_OTHER): Payer: Medicare Other | Admitting: Internal Medicine

## 2018-04-26 ENCOUNTER — Encounter: Payer: Self-pay | Admitting: Internal Medicine

## 2018-04-26 DIAGNOSIS — Z7901 Long term (current) use of anticoagulants: Secondary | ICD-10-CM

## 2018-04-26 DIAGNOSIS — J0100 Acute maxillary sinusitis, unspecified: Secondary | ICD-10-CM

## 2018-04-26 DIAGNOSIS — I4892 Unspecified atrial flutter: Secondary | ICD-10-CM | POA: Diagnosis not present

## 2018-04-26 DIAGNOSIS — I483 Typical atrial flutter: Secondary | ICD-10-CM

## 2018-04-26 DIAGNOSIS — J018 Other acute sinusitis: Secondary | ICD-10-CM | POA: Diagnosis not present

## 2018-04-26 DIAGNOSIS — Z79899 Other long term (current) drug therapy: Secondary | ICD-10-CM

## 2018-04-26 DIAGNOSIS — I10 Essential (primary) hypertension: Secondary | ICD-10-CM

## 2018-04-26 MED ORDER — SALINE SPRAY 0.65 % NA SOLN: 1.0000 | NASAL | 0 refills | Status: DC | PRN

## 2018-04-26 MED ORDER — AMOXICILLIN 500 MG PO TABS: 500.0000 mg | ORAL_TABLET | Freq: Two times a day (BID) | ORAL | 0 refills | Status: AC

## 2018-04-26 MED ORDER — ALBUTEROL SULFATE HFA 108 (90 BASE) MCG/ACT IN AERS: INHALATION_SPRAY | RESPIRATORY_TRACT | 0 refills | Status: DC

## 2018-04-26 MED ORDER — ACETAMINOPHEN 500 MG PO TABS
1000.0000 mg | ORAL_TABLET | Freq: Once | ORAL | Status: AC
  Administered 2018-04-26: 500 mg via ORAL

## 2018-04-26 MED ORDER — NIFEDIPINE ER 90 MG PO TB24: 90.0000 mg | ORAL_TABLET | Freq: Every day | ORAL | 3 refills | Status: DC

## 2018-04-26 MED ORDER — LORATADINE 10 MG PO TABS: 10.0000 mg | ORAL_TABLET | Freq: Every day | ORAL | 2 refills | Status: DC

## 2018-04-26 NOTE — Assessment & Plan Note (Signed)
Patient was currently in sinus rhythm and denies any palpitations.  She was advised to continue using her diltiazem and Xarelto.

## 2018-04-26 NOTE — Assessment & Plan Note (Addendum)
Patient complain and exam was consistent with sinusitis involving maxillary and frontal sinuses. Her symptoms are getting worse despite using supportive measures which include Flonase, saline nasal spray, Nettie Pot and over-the-counter sinus medications.  -Given a prescription of amoxicillin 500 mg twice daily for 10 days. -She was advised to continue using saline nasal spray, Flonase and Nettie pot. -She can take Tylenol as needed for pain. -Avoid using over-the-counter decongestants because of worsening hypertension. -She was also advised against using any NSAID because of her CKD, patient was using ibuprofen occasionally.

## 2018-04-26 NOTE — Assessment & Plan Note (Addendum)
BP Readings from Last 3 Encounters:  04/26/18 (!) 197/113  03/26/18 128/80  03/02/18 (!) 148/89   Patient's blood pressure was elevated today.  She was also complaining of headache but denies any blurry vision, nausea or vomiting.  She denies any chest pain, dyspnea, orthopnea or PND. According to patient whenever she has sinusitis her blood pressure increased. She took lisinopril and Carvedilol just before coming to the clinic this morning.  During her recent hospitalization approximately a month ago due to asthma exacerbation, there was some concern for polypharmacy and she was asked to stop her nifedipine and carvedilol, and she was supposed to continue taking her lisinopril and diltiazem. Patient continued to take her carvedilol but stopped taking nifedipine. She normally is takes carvedilol and lisinopril in the morning and diltiazem at bedtime.  Patient might be having elevated blood pressure because of her sinus condition and taking over-the-counter sinus medications. She is on 2 nodal blockers,, denies any dizziness.  She was having regular rate and rhythm today.  Continue to take carvedilol despite being told to stop because of some is under standing.  Tylenol was given in the clinic with improvement in her headache, blood pressure on subsequent change was even higher patient denies any headache, change in vision, focal weakness or chest pressure.  It has to go for another appointment.  She was given strict instructions to call back clinic or come to ED if she experience worsening headache, chest pain, shortness of breath or focal weakness.  -Stop taking carvedilol, had a long discussion with patient, she seems understanding. -Restart nifedipine at her previous dose of 90 mg daily. -Continue taking lisinopril 40 mg daily and diltiazem 240 mg daily. -Follow-up in 2 weeks for blood pressure check.

## 2018-04-26 NOTE — Progress Notes (Addendum)
   CC: Sinus congestion and headache for 4 days.  HPI:  Shannon [name] is a 62 y.o. with past medical history as listed below came to the clinic with complaint of sinus congestion and headache for the past 4 to 5 days.  Patient started getting some nasal congestion and headaches for about 4 to 5 days ago, with thick greenish discharge.  She denies any fever.  She is started getting cough with some greenish sputum for 1 day.  She was also complaining of left ear fullness, and facial pain.  She was complaining of pressure around her eyes and unable to bend her face.  She denies any sore throat or shortness of breath or wheezing. She was using Flonase, Tylenol and Nettie pot with not much relief.  Patient has a recent hospitalization in early August due to asthma exacerbation.  Please see assessment and plan for her chronic conditions.   Past Medical History:  Diagnosis Date  . Anemia   . Anxiety    HX PANIC ATTACKS  . Arthritis    "starting to; in my hands" (07/09/2015)  . Atrial fibrillation ([place])   . Atrial flutter, paroxysmal ([place])   . Chronic asthma with acute exacerbation    "I have chronic asthma all the time; sometimes exacerbations" (07/09/2015)  . Chronic lower back pain   . Cyst of right kidney    "3 of them; dx'd in ~ 01/2015"  . GERD (gastroesophageal reflux disease)   . Heart murmur   . History of blood transfusion    "related to my brain surgery I think"  . History of pulmonary embolism 07/09/2015  . HIV antibody positive ([place])   . HIV disease ([place])   . Hyperlipidemia   . Hypertension   . Lipodystrophy   . Mild CAD 2013  . Pneumonia 07/09/2015  . Shingles   . Sleep apnea    "never completed part 2 of study; never wore mask" (07/09/2015)   Review of Systems: Negative except mentioned in HPI.  Physical Exam:  Vitals:   04/26/18 0916  BP: (!) 193/112  Pulse: 84  Temp: 98.7 F (37.1 C)  TempSrc: Oral  SpO2: 97%  Weight: 229 lb 14.4 oz (104.3 kg)    Height: 5\' 4"  (1.626 m)    General: Vital signs reviewed.  Patient is well-developed and well-nourished, in no acute distress and cooperative with exam.  HEENT: Normocephalic and atraumatic.  Facial tenderness along bilateral maxillary and frontal sinuses, bulging left tympanic membrane, mild erythema of bilateral nasal turbinate with no visual drainage, no erythema  Cardiovascular: RRR, S1 normal, S2 normal, no murmurs, gallops, or rubs. Pulmonary/Chest: Clear to auscultation bilaterally, no wheezes, rales, or rhonchi. Abdominal: Soft, non-tender, non-distended, BS +ve.  Extremities: No lower extremity edema bilaterally,  pulses symmetric and intact bilaterally. No cyanosis or clubbing. Skin: Warm, dry and intact. No rashes or erythema. Psychiatric: Normal mood and affect. speech and behavior is normal. Cognition and memory are normal.  Assessment & Plan:   See Encounters Tab for problem based charting.  Patient discussed with Dr. Eppie Gibson.

## 2018-04-26 NOTE — Patient Instructions (Signed)
Thank you for visiting clinic today. For your sinusitis I am giving you amoxicillin 500 mg twice a day for 10 days, you should keep using saline nasal spray, Nettie pot and Flonase. You can use Tylenol as needed for your pain. For your blood pressure please stop taking carvedilol. I am restarting nifedipine 90 mg daily. Continue taking lisinopril and diltiazem. Please follow-up in 2 weeks for your blood pressure check.

## 2018-04-27 NOTE — Progress Notes (Signed)
Case discussed with Dr. Reesa Chew at the time of the visit.  We reviewed the resident's history and exam and pertinent patient test results.  I agree with the assessment, diagnosis and plan of care documented in the resident's note.

## 2018-05-03 ENCOUNTER — Ambulatory Visit (INDEPENDENT_AMBULATORY_CARE_PROVIDER_SITE_OTHER): Payer: Medicare Other | Admitting: Cardiovascular Disease

## 2018-05-03 ENCOUNTER — Encounter: Payer: Self-pay | Admitting: Cardiovascular Disease

## 2018-05-03 VITALS — BP 188/108 | HR 90 | Ht 64.0 in | Wt 224.0 lb

## 2018-05-03 DIAGNOSIS — E881 Lipodystrophy, not elsewhere classified: Secondary | ICD-10-CM | POA: Diagnosis not present

## 2018-05-03 DIAGNOSIS — Z5181 Encounter for therapeutic drug level monitoring: Secondary | ICD-10-CM

## 2018-05-03 DIAGNOSIS — I10 Essential (primary) hypertension: Secondary | ICD-10-CM | POA: Diagnosis not present

## 2018-05-03 DIAGNOSIS — Z6841 Body Mass Index (BMI) 40.0 and over, adult: Secondary | ICD-10-CM

## 2018-05-03 DIAGNOSIS — E7800 Pure hypercholesterolemia, unspecified: Secondary | ICD-10-CM | POA: Diagnosis not present

## 2018-05-03 DIAGNOSIS — R0602 Shortness of breath: Secondary | ICD-10-CM

## 2018-05-03 MED ORDER — CLONIDINE HCL 0.1 MG PO TABS: ORAL_TABLET | ORAL | 5 refills | Status: DC

## 2018-05-03 NOTE — Progress Notes (Signed)
Cardiology Office Note   Date:  05/03/2018   ID:  Shannon [name], DOB [birth year], MRN [id number]  PCP:  Shannon Falcon, MD  Cardiologist:   Shannon Latch, MD  ID: Shannon Basques, MD  No chief complaint on file.    History of Present Illness: Shannon [name] is a 62 y.o. female with hypertension, asthma, non-obstructive CAD, OSA, prior PE, moderate pulmonary hypertension, mild ascending aorta aneurysm, and HIV (well-controlled)  who presents for follow up.  Shannon [name] was initially referred by her infectious disease doctor, Shannon Basques, MD, on 12/26/15. Dr. Baxter [name] was concerned about her lower extremity edema and shortness of breath.  She was started on lasix 20 mg every other day.  Shannon [name] noted shortness of breath since her hospitalization for pneumonia 06/2015.   She denied lower extremity edema or orthopnea but did report chest pain when laying down at night.  She was referred for an echo 01/2016 that revealed LVEF 60-65% awith grade 1 diastolic dysfunction and hypokinesis of the basal inferior wall.  She also had very mild aortic stenosis with a mean gradient of 9 mmHg and trivial AR.  PASP was 52 mmHg.  She had a Lexiscan Myoview at that time that was negative for ischemia.  Shannon [name] previously had a Lexiscan Cardiolite in September 2013 that revealed a medium, mild reversible defect in the anterior wall and normal systolic function. She subsequently underwent cardiac catheterization that revealed nonobstructive coronary disease. She had mild pulmonary hypertension and a normal cardiac output. Left ventriculography revealed mild to moderate aortic regurgitation. She had an echo May 2016 that revealed an ejection fraction of 53% and mild diastolic dysfunction. It also revealed calcification of the aortic valve, mild to moderate aortic regurgitation, and mild aortic stenosis. Peak velocity was 2.1 m/s. She had a 24-hour Holter in February 2015 that revealed rare PACs and PVCs.  Echo 01/06/17  revealed LVEF 60-65% with mild LVH and a mean aortic valve gradient of 8 mmHg.  Tricuspid regurgitation was not significant enough to assess pulmonary pressures  Shannon [name] was admitted to the hospital 01/25/17 with hypertensive emergency and chest pain. Her blood pressure was 215/100.  Her blood pressure in the ED was 185/119. She was again seen in the ED 01/28/17 with shortness of breath and wheezing. Her blood pressure at that time was 190/120. It was noted that she hasn't been taking her medication as prescribed.  HCTZ was discontinued.     Shannon [name] has been doing much better with her blood pressure.  She has stopped taking losartan because her blood pressure gets too low and she feels poorly.  She notes that her blood pressure is elevated when she is in pain.  She has a lot of pain from her neck and back.  She was also in the ED 08/25/17 for a URI and on 08/18/17 for R sided chest and neck pain.  Her BP was 172/97 at the time. She continues to also complain of her ganglion cyst, poor healing from an anterior tibia wound, lipodystrophy and skepticism about whether she should change her antiretrovirals as directed by her infectious disease team.  Due to the poor healing of her leg wound she decided to stop taking her anticoagulation.  She also thinks that this is causing her to feel cold.  She struggles with nausea and is not eating well.  She sees her gastroenterologist next week.  She has numbness in the left side of her face and  neck and is following up with urology for this.  She has been very unstable on her feet which she attributes to central adiposity.  She wonders if she could be a candidate for gastric bypass.  She has been referred to see Shannon [name].  Shannon [name] saw Shannon Ransom, PA-C on 12/2017 and her BP was poorly controlled.  She wasn't taking lasix due to fear it would dehydrate her.  Shannon [name] suggested switching to a different ARB or starting a daily diuretic but she declined.  At her last appointment Ms.  [name] BP was elevated.  She was not taking losartan 2/2 recall so she was started on lisinopril. Since then she was admitted for an asthma exacerbation 03/2018.  At that time her BP was poorly controlled.  Carvedilol and nebivolol were stopped and her BP was controlled.  Since then she followed up with her PCP 04/26/18 for headache and her BP was 193/112.  She had continued to take carvedilol and this was discontinued at her clinic.  However they restarted her nifedipine.   Shannon [name] has been struggling with pain in neck that radiates into her L arm and head.  She has a constant headache that lasts for weeks.  She also struggled with a bad sinus infection. Sh wil be starting allergy shots soon.  Her BP has been running high at home.  So fars she has only taken nifedipine.  She can't take all her medications on an empty stomach.  She has been working on her diet and has stopped added salts, sugars and red meat.  She reports that she is taking all her medications as prescribed.  She has not had any lower extremity edema and her breathing is improved.  The only time she has chest pain is when the pain from her neck radiates into her chest.  She has no exertional symptoms, though she does get short of breath when she tries to ride her elliptical.   Past Medical History:  Diagnosis Date  . Anemia   . Anxiety    HX PANIC ATTACKS  . Arthritis    "starting to; in my hands" (07/09/2015)  . Atrial fibrillation ([place])   . Atrial flutter, paroxysmal ([place])   . Chronic asthma with acute exacerbation    "I have chronic asthma all the time; sometimes exacerbations" (07/09/2015)  . Chronic lower back pain   . Cyst of right kidney    "3 of them; dx'd in ~ 01/2015"  . GERD (gastroesophageal reflux disease)   . Heart murmur   . History of blood transfusion    "related to my brain surgery I think"  . History of pulmonary embolism 07/09/2015  . HIV antibody positive ([place])   . HIV disease ([place])   . Hyperlipidemia   .  Hypertension   . Lipodystrophy   . Mild CAD 2013  . Pneumonia 07/09/2015  . Shingles   . Sleep apnea    "never completed part 2 of study; never wore mask" (07/09/2015)    Past Surgical History:  Procedure Laterality Date  . ABDOMINAL HYSTERECTOMY     "robotic laparosopic"  . BRAIN SURGERY  1974   "brain tumor; benign; on top of my brain; got a plate in there"  . CARDIAC CATHETERIZATION    . TONSILLECTOMY AND ADENOIDECTOMY       Current Outpatient Medications  Medication Sig Dispense Refill  . acetaminophen (TYLENOL) 325 MG tablet Take 325 mg by mouth every 6 (six) hours as needed (FOR  PAIN).    . albuterol (PROAIR HFA) 108 (90 Base) MCG/ACT inhaler INHALE 2 PUFFS INTO THE LUNGS EVERY 4 HOURS AS NEEDED FOR WHEEZING OR SHORTNESS OF BREATH 8 g 0  . amoxicillin (AMOXIL) 500 MG tablet Take 1 tablet (500 mg total) by mouth 2 (two) times daily for 10 days. 20 tablet 0  . Ascorbic Acid (VITAMIN C) 1000 MG tablet Take 1,000 mg by mouth 2 (two) times a week.     [name] azelastine (OPTIVAR) 0.05 % ophthalmic solution Place 2 drops into both eyes 2 (two) times daily as needed (for swelling or itching).   3  . bictegravir-emtricitabine-tenofovir AF (BIKTARVY) 50-200-25 MG TABS tablet Take 1 tablet by mouth daily. 30 tablet 11  . BIOTIN PO Take 1 tablet by mouth daily as needed (for supplementation).     . cholecalciferol (VITAMIN D) 1000 units tablet Take 1,000 Units by mouth 2 (two) times a week.     . cyanocobalamin (,VITAMIN B-12,) 1000 MCG/ML injection ADMINISTER 1 ML(1000 MCG) IN THE MUSCLE EVERY 30 DAYS 1 mL 5  . diltiazem (CARDIZEM CD) 240 MG 24 hr capsule Take 1 capsule (240 mg total) by mouth daily. 90 capsule 0  . fluticasone (FLONASE) 50 MCG/ACT nasal spray Place 1 spray into both nostrils daily. 9.9 g 0  . fluticasone furoate-vilanterol (BREO ELLIPTA) 100-25 MCG/INH AEPB Inhale 1 puff into the lungs daily. Do not take if also taking Trelegy 28 each 1  . hydrOXYzine (ATARAX/VISTARIL) 50 MG  tablet Take 1 tablet (50 mg total) by mouth at bedtime as needed for anxiety (or insomnia). 30 tablet 0  . lisinopril (PRINIVIL,ZESTRIL) 40 MG tablet Take 1 tablet (40 mg total) by mouth daily. 90 tablet 3  . loratadine (CLARITIN) 10 MG tablet Take 1 tablet (10 mg total) by mouth daily. 30 tablet 2  . LORazepam (ATIVAN) 0.5 MG tablet Take 1 tablet (0.5 mg total) by mouth 3 (three) times daily as needed for anxiety. 30 tablet 0  . Melatonin 5 MG TABS Take 5 mg by mouth at bedtime as needed (for sleep).    . Mouthwashes (BIOTENE DRY MOUTH GENTLE) LIQD Use as directed 1 Dose in the mouth or throat 2 (two) times daily. 1 Bottle 0  . NIFEdipine (ADALAT CC) 90 MG 24 hr tablet Take 1 tablet (90 mg total) by mouth daily. 30 tablet 3  . OXYGEN Inhale 1.5-2 L into the lungs continuous.     . polyethylene glycol (MIRALAX / GLYCOLAX) packet Take 17 g by mouth daily. 14 each 0  . PREVIDENT 5000 SENSITIVE 1.1-5 % PSTE Use daily as directed  1  . rivaroxaban (XARELTO) 20 MG TABS tablet Take 1 tablet (20 mg total) by mouth daily with supper. 30 tablet 6  . senna-docusate (SENOKOT-S) 8.6-50 MG tablet Take 1 tablet by mouth at bedtime as needed for mild constipation. 30 tablet 0  . sodium chloride (OCEAN) 0.65 % SOLN nasal spray Place 1 spray into both nostrils as needed for congestion. 88 mL 0  . Tetrahydrozoline HCl (VISINE OP) Place 2 drops into both eyes daily as needed (for dry eyes).     [name] umeclidinium bromide (INCRUSE ELLIPTA) 62.5 MCG/INH AEPB Inhale 1 puff into the lungs daily. Do not take if also taking Trelegy 30 each 1  . cloNIDine (CATAPRES) 0.1 MG tablet TAKE 1 TABLET TWICE A DAY AS NEEDED FOR SYSTOLIC BLOOD PRESSURE (TOP NUMBER) GREATER THAN 160 60 tablet 5  . potassium chloride (K-DUR) 10 MEQ tablet  Take 1 tablet (10 mEq total) daily by mouth. (Patient taking differently: Take 10 mEq by mouth every other day. ) 30 tablet 5   No current facility-administered medications for this visit.      Allergies:   Tree extract and Ciprofloxacin    Social History:  The patient  reports that she has never smoked. She has never used smokeless tobacco. She reports that she drinks alcohol. She reports that she does not use drugs.   Family History:  The patient's family history includes Asthma in her mother; Diabetes in her sister; Heart failure in her mother; Heart murmur in her brother, sister, and sister; Thyroid disease in her sister.    ROS:  Please see the history of present illness.   Otherwise, review of systems are positive for none   All other systems are reviewed and negative.   PHYSICAL EXAM: VS:  BP (!) 188/108   Pulse 90   Ht 5\' 4"  (1.626 m)   Wt 224 lb (101.6 kg)   BMI 38.45 kg/m  , BMI Body mass index is 38.45 kg/m. GENERAL:  Well appearing HEENT: Pupils equal round and reactive, fundi not visualized, oral mucosa unremarkable NECK:  No jugular venous distention, waveform within normal limits, carotid upstroke brisk and symmetric, no bruits LUNGS:  Clear to auscultation bilaterally HEART:  RRR.  PMI not displaced or sustained,S1 and S2 within normal limits, no S3, no S4, no clicks, no rubs, no murmurs ABD:  Flat, positive bowel sounds normal in frequency in pitch, no bruits, no rebound, no guarding, no midline pulsatile mass, no hepatomegaly, no splenomegaly EXT:  2 plus pulses throughout, no edema, no cyanosis no clubbing.  lipodistrophy SKIN:  No rashes no nodules NEURO:  Cranial nerves II through XII grossly intact, motor grossly intact throughout PSYCH:  Cognitively intact, oriented to person place and time  EKG:  EKG is not ordered today. The ekg ordered 12/31/15 shows sinus rhythm. Rate 89 bpm. 2 PVCs. 10/28/16: Sinus rhythm. Rate 93 bpm 07/01/17: Sinus tachycardia.  Rate 115 bpm.  LAD.    Coronary angiography 06/01/12: 10-20% OM, diffuse 20-30% LAD, 10-20% D1, 20% diffuse RCA RA 10, RV 39/12, RV EDP 12, capillary wedge pressure 19, PA 39/21, mean 27, LV 116/14,  LVEDP 20, aorta 115/77 Cardiac output (Fick) 6.55, cardiac index 3.2 PVR 1.7 with units, SVR 12 with units. She P/QRS ratio 1 LVEF 80%. Aortic root mildly dilated. 1-2+ aortic regurgitation.  24-hour Holter 09/26/13: Sinus rhythm, rare PACs, rare PVCs  Echo 01/22/15: LVEF 70%. Mild LVH. Mild diastolic dysfunction. Normal RV function. Left atrium moderately enlarged. Right atrium mildly enlarged. Trace mitral regurgitation. Calcific aortic valve. Mild to moderate aortic regurgitation. Mild aortic stenosis. Peak velocity 2.1 m/s trace tricuspid regurgitation    Lexiscan Myoview 01/2016:   The left ventricular ejection fraction is normal (55-65%). The EF is 60% visually. The computer generated EF is not calculated correctly and is therefore not reported.  There was no ST segment deviation noted during stress.  The study is normal. no ischemia . no infarction   Echo 01/29/16: Study Conclusions  - Left ventricle: The cavity size was normal. Wall thickness was   increased in a pattern of moderate LVH. Systolic function was   normal. The estimated ejection fraction was in the range of 60%   to 65%. Basal inferior hypokinesis. Doppler parameters are   consistent with abnormal left ventricular relaxation (grade 1   diastolic dysfunction). - Aortic valve: Trileaflet; moderately calcified leaflets.  There   was very mild stenosis. There was mild regurgitation. Mean   gradient (S): 9 mm Hg. Valve area (VTI): 1.68 cm^2. - Mitral valve: There was trivial regurgitation. - Left atrium: The atrium was mildly dilated. - Right ventricle: The cavity size was normal. Systolic function   was normal. - Tricuspid valve: Peak RV-RA gradient (S): 44 mm Hg. - Pulmonary arteries: PA peak pressure: 52 mm Hg (S). - Systemic veins: IVC measured 2.3 cm with > 50% respirophasic   variation, suggesting RA pressure 8 mmHg.  Impressions:  - Normal LV size with moderate LV hypertrophy. EF 60-65%. Basal   inferior  hypokinesis. Normal RV size and systolic function. Very   mild aortic stenosis, mild aortic insufficieny. Moderate   pulmonary hypertension.   Recent Labs: 08/25/2017: TSH 0.475 02/01/2018: ALT 15 03/23/2018: B Natriuretic Peptide 106.5 03/24/2018: BUN 21; Creatinine, Ser 1.39; Hemoglobin 12.3; Platelets 188; Potassium 4.3; Sodium 142    Lipid Panel    Component Value Date/Time   CHOL 217 (H) 11/26/2016 1618   TRIG 88 11/26/2016 1618   HDL 67 11/26/2016 1618   CHOLHDL 3.2 11/26/2016 1618   VLDL 18 11/26/2016 1618   LDLCALC 132 (H) 11/26/2016 1618      Wt Readings from Last 3 Encounters:  05/03/18 224 lb (101.6 kg)  04/26/18 229 lb 14.4 oz (104.3 kg)  03/23/18 230 lb 6.1 oz (104.5 kg)      ASSESSMENT AND PLAN:  # Atrial flutter: Converted with flecainide.  Continue diltiazem and Xarelto.   # Hypertension:  BP remains above goal.  Interestingly, her blood pressure was well-controlled in the hospital after stopping carvedilol and nifedipine.  However her blood pressure has since increased.  I suspect that some of this is due to constant pain.  She is very reluctant to start any more medications.  However she agreed to take clonidine 0.1 mg twice daily as needed if her SBP is greater than 160.  She will also follow-up with neurology to see if there are any treatments for her neuropathic pain.  Continue nifedipine, diltiazem, and lisinopril.  # Aortic regurgitation # Aortic stenosis: Ms. Blanchette has very mild aortic stenosis (mean gradient 9 mmHg) and mild aortic regurgitation.  Continue to monitor.    # Hyperlipidemia: Check lipids and CMP.  # Ascending aorta aneurysm: 4.0 on CT 12/2016.  Will re-assess at follow up.   Time spent: 30 minutes-Greater than 50% of this time was spent in counseling, explanation of diagnosis, planning of further management, and coordination of care.    Current medicines are reviewed at length with the patient today.  The patient does not have concerns  regarding medicines.  The following changes have been made:  None   Labs/ tests ordered today include:   Orders Placed This Encounter  Procedures  . Lipid panel  . Comprehensive metabolic panel     Disposition:   FU with Laura Caldas C. Oval Linsey, MD, [facility] in 2-4 weeks.      Signed, Averie Hornbaker C. Oval Linsey, MD, [facility]  05/03/2018 10:56 AM    [place]

## 2018-05-03 NOTE — Patient Instructions (Addendum)
Medication Instructions:  START CLONIDINE 0.1 MG TWICE A DAY FOR SYSTOLIC BLOOD PRESSURE (TOP NUMBER) GREATER THAN 160   USE TYLENOL AS NEEDED INSTEAD OF ASPIRIN   Labwork: LP/CMET TODAY   Testing/Procedures: NONE  Follow-Up: Your physician recommends that you schedule a follow-up appointment in: 2-4 WEEKS   Any Other Special Instructions Will Be Listed Below (If Applicable).  MONITOR AND WRITE DOWN YOUR BLOOD PRESSURE, BRING READINGS TO YOUR FOLLOW UP APPOINTMENT   If you need a refill on your cardiac medications before your next appointment, please call your pharmacy.

## 2018-05-04 LAB — COMPREHENSIVE METABOLIC PANEL
ALT: 16 IU/L (ref 0–32)
AST: 21 IU/L (ref 0–40)
Albumin/Globulin Ratio: 2 (ref 1.2–2.2)
Albumin: 4.5 g/dL (ref 3.6–4.8)
Alkaline Phosphatase: 87 IU/L (ref 39–117)
BUN/Creatinine Ratio: 14 (ref 12–28)
BUN: 17 mg/dL (ref 8–27)
Bilirubin Total: 0.4 mg/dL (ref 0.0–1.2)
CALCIUM: 10.3 mg/dL (ref 8.7–10.3)
CO2: 29 mmol/L (ref 20–29)
Chloride: 97 mmol/L (ref 96–106)
Creatinine, Ser: 1.22 mg/dL — ABNORMAL HIGH (ref 0.57–1.00)
GFR calc Af Amer: 54 mL/min/{1.73_m2} — ABNORMAL LOW (ref >59)
GFR, EST NON AFRICAN AMERICAN: 47 mL/min/{1.73_m2} — AB (ref >59)
GLOBULIN, TOTAL: 2.3 g/dL (ref 1.5–4.5)
Glucose: 103 mg/dL — ABNORMAL HIGH (ref 65–99)
Potassium: 4.2 mmol/L (ref 3.5–5.2)
SODIUM: 140 mmol/L (ref 134–144)
TOTAL PROTEIN: 6.8 g/dL (ref 6.0–8.5)

## 2018-05-04 LAB — LIPID PANEL
CHOLESTEROL TOTAL: 253 mg/dL — AB (ref 100–199)
Chol/HDL Ratio: 2.9 ratio (ref 0.0–4.4)
HDL: 86 mg/dL (ref >39)
LDL CALC: 156 mg/dL — AB (ref 0–99)
Triglycerides: 53 mg/dL (ref 0–149)
VLDL Cholesterol Cal: 11 mg/dL (ref 5–40)

## 2018-05-10 ENCOUNTER — Ambulatory Visit: Payer: Medicare Other | Admitting: Pulmonary Disease

## 2018-05-10 ENCOUNTER — Ambulatory Visit: Payer: Medicare Other | Admitting: Internal Medicine

## 2018-05-11 ENCOUNTER — Ambulatory Visit: Payer: Medicare Other | Admitting: Pulmonary Disease

## 2018-05-13 DIAGNOSIS — J189 Pneumonia, unspecified organism: Secondary | ICD-10-CM | POA: Diagnosis not present

## 2018-05-13 DIAGNOSIS — G4733 Obstructive sleep apnea (adult) (pediatric): Secondary | ICD-10-CM | POA: Diagnosis not present

## 2018-05-17 ENCOUNTER — Ambulatory Visit: Payer: Medicare Other

## 2018-05-17 ENCOUNTER — Encounter: Payer: Self-pay | Admitting: Internal Medicine

## 2018-05-17 ENCOUNTER — Ambulatory Visit: Payer: Medicare Other | Admitting: Cardiovascular Disease

## 2018-05-17 ENCOUNTER — Ambulatory Visit: Payer: Medicare Other | Admitting: Internal Medicine

## 2018-05-18 ENCOUNTER — Encounter: Payer: Self-pay | Admitting: *Deleted

## 2018-05-26 ENCOUNTER — Other Ambulatory Visit: Payer: Self-pay | Admitting: Internal Medicine

## 2018-05-26 DIAGNOSIS — I483 Typical atrial flutter: Secondary | ICD-10-CM

## 2018-05-31 ENCOUNTER — Telehealth: Payer: Self-pay | Admitting: Cardiovascular Disease

## 2018-05-31 ENCOUNTER — Ambulatory Visit: Payer: Medicare Other | Admitting: Cardiovascular Disease

## 2018-05-31 NOTE — Progress Notes (Deleted)
Cardiology Office Note   Date:  05/31/2018   ID:  Shannon [name], DOB 1955-09-07, MRN [id number]  PCP:  [name], MD  Cardiologist:   Skeet Latch, MD  ID: [name], MD  No chief complaint on file.    History of Present Illness: Shannon [name] is a 62 y.o. female with hypertension, asthma, non-obstructive CAD, OSA, prior PE, moderate pulmonary hypertension, mild ascending aorta aneurysm, and HIV (well-controlled)  who presents for follow up.  Shannon [name] was initially referred by her infectious disease doctor, [name], MD, on 12/26/15. [name] was concerned about her lower extremity edema and shortness of breath.  She was started on lasix 20 mg every other day.  Ms. Sangalang noted shortness of breath since her hospitalization for pneumonia 06/2015.   She denied lower extremity edema or orthopnea but did report chest pain when laying down at night.  She was referred for an echo 01/2016 that revealed LVEF 60-65% awith grade 1 diastolic dysfunction and hypokinesis of the basal inferior wall.  She also had very mild aortic stenosis with a mean gradient of 9 mmHg and trivial AR.  PASP was 52 mmHg.  She had a Lexiscan Myoview at that time that was negative for ischemia.  Shannon [name] previously had a Lexiscan Cardiolite in September 2013 that revealed a medium, mild reversible defect in the anterior wall and normal systolic function. She subsequently underwent cardiac catheterization that revealed nonobstructive coronary disease. She had mild pulmonary hypertension and a normal cardiac output. Left ventriculography revealed mild to moderate aortic regurgitation. She had an echo May 2016 that revealed an ejection fraction of 70% and mild diastolic dysfunction. It also revealed calcification of the aortic valve, mild to moderate aortic regurgitation, and mild aortic stenosis. Peak velocity was 2.1 m/s. She had a 24-hour Holter in February 2015 that revealed rare PACs and PVCs.  Echo 01/06/17  revealed LVEF 60-65% with mild LVH and a mean aortic valve gradient of 8 mmHg.  Tricuspid regurgitation was not significant enough to assess pulmonary pressures  Ms. Robideau was admitted to the hospital 01/25/17 with hypertensive emergency and chest pain. Her blood pressure was 215/100.  Her blood pressure in the ED was 185/119. She was again seen in the ED 01/28/17 with shortness of breath and wheezing. Her blood pressure at that time was 190/120. It was noted that she hasn't been taking her medication as prescribed.  HCTZ was discontinued.     Shannon [name] has been doing much better with her blood pressure.  She has stopped taking losartan because her blood pressure gets too low and she feels poorly.  She notes that her blood pressure is elevated when she is in pain.  She has a lot of pain from her neck and back.  She was also in the ED 08/25/17 for a URI and on 08/18/17 for R sided chest and neck pain.  Her BP was 172/97 at the time. She continues to also complain of her ganglion cyst, poor healing from an anterior tibia wound, lipodystrophy and skepticism about whether she should change her antiretrovirals as directed by her infectious disease team.  Due to the poor healing of her leg wound she decided to stop taking her anticoagulation.  She also thinks that this is causing her to feel cold.  She struggles with nausea and is not eating well.  She sees her gastroenterologist next week.  She has numbness in the left side of her face and  neck and is following up with urology for this.  She has been very unstable on her feet which she attributes to central adiposity.  She wonders if she could be a candidate for gastric bypass.  She has been referred to see Dr. Leafy Ro.  Shannon [name] saw Shannon Ransom, PA-C on 12/2017 and her BP was poorly controlled.  She wasn't taking lasix due to fear it would dehydrate her.  [name] suggested switching to a different ARB or starting a daily diuretic but she declined.  At her last appointment Ms.  Bur's BP was elevated.  She was not taking losartan 2/2 recall so she was started on lisinopril. Since then she was admitted for an asthma exacerbation 03/2018.  At that time her BP was poorly controlled.  Carvedilol and nebivolol were stopped and her BP was controlled.  Since then she followed up with her PCP 04/26/18 for headache and her BP was 193/112.  She had continued to take carvedilol and this was discontinued at her clinic.  However they restarted her nifedipine.   Shannon [name] has been struggling with pain in neck that radiates into her L arm and head.  She has a constant headache that lasts for weeks.  She also struggled with a bad sinus infection. Sh wil be starting allergy shots soon.  Her BP has been running high at home.  So fars she has only taken nifedipine.  She can't take all her medications on an empty stomach.  She has been working on her diet and has stopped added salts, sugars and red meat.  She reports that she is taking all her medications as prescribed.  She has not had any lower extremity edema and her breathing is improved.  The only time she has chest pain is when the pain from her neck radiates into her chest.  She has no exertional symptoms, though she does get short of breath when she tries to ride her elliptical.  Shannon [name] was admitted 02/2018 with asthma.  During that hospitalization her BP was controlled and she remained in sinus rhythm.   Past Medical History:  Diagnosis Date  . Anemia   . Anxiety    HX PANIC ATTACKS  . Arthritis    "starting to; in my hands" (07/09/2015)  . Atrial fibrillation ([place])   . Atrial flutter, paroxysmal ([place])   . Chronic asthma with acute exacerbation    "I have chronic asthma all the time; sometimes exacerbations" (07/09/2015)  . Chronic lower back pain   . Cyst of right kidney    "3 of them; dx'd in ~ 01/2015"  . GERD (gastroesophageal reflux disease)   . Heart murmur   . History of blood transfusion    "related to my brain surgery I  think"  . History of pulmonary embolism 07/09/2015  . HIV antibody positive ([place])   . HIV disease ([place])   . Hyperlipidemia   . Hypertension   . Lipodystrophy   . Mild CAD 2013  . Pneumonia 07/09/2015  . Shingles   . Sleep apnea    "never completed part 2 of study; never wore mask" (07/09/2015)    Past Surgical History:  Procedure Laterality Date  . ABDOMINAL HYSTERECTOMY     "robotic laparosopic"  . BRAIN SURGERY  1974   "brain tumor; benign; on top of my brain; got a plate in there"  . CARDIAC CATHETERIZATION    . TONSILLECTOMY AND ADENOIDECTOMY       Current Outpatient Medications  Medication  Sig Dispense Refill  . acetaminophen (TYLENOL) 325 MG tablet Take 325 mg by mouth every 6 (six) hours as needed (FOR PAIN).    [name] albuterol (PROAIR HFA) 108 (90 Base) MCG/ACT inhaler INHALE 2 PUFFS INTO THE LUNGS EVERY 4 HOURS AS NEEDED FOR WHEEZING OR SHORTNESS OF BREATH 8 g 0  . Ascorbic Acid (VITAMIN C) 1000 MG tablet Take 1,000 mg by mouth 2 (two) times a week.     [name] azelastine (OPTIVAR) 0.05 % ophthalmic solution Place 2 drops into both eyes 2 (two) times daily as needed (for swelling or itching).   3  . bictegravir-emtricitabine-tenofovir AF (BIKTARVY) 50-200-25 MG TABS tablet Take 1 tablet by mouth daily. 30 tablet 11  . BIOTIN PO Take 1 tablet by mouth daily as needed (for supplementation).     . cholecalciferol (VITAMIN D) 1000 units tablet Take 1,000 Units by mouth 2 (two) times a week.     . cloNIDine (CATAPRES) 0.1 MG tablet TAKE 1 TABLET TWICE A DAY AS NEEDED FOR SYSTOLIC BLOOD PRESSURE (TOP NUMBER) GREATER THAN 160 60 tablet 5  . cyanocobalamin (,VITAMIN B-12,) 1000 MCG/ML injection ADMINISTER 1 ML(1000 MCG) IN THE MUSCLE EVERY 30 DAYS 1 mL 5  . diltiazem (CARDIZEM CD) 240 MG 24 hr capsule Take 1 capsule (240 mg total) by mouth daily. 90 capsule 0  . fluticasone (FLONASE) 50 MCG/ACT nasal spray Place 1 spray into both nostrils daily. 9.9 g 0  . fluticasone furoate-vilanterol  (BREO ELLIPTA) 100-25 MCG/INH AEPB Inhale 1 puff into the lungs daily. Do not take if also taking Trelegy 28 each 1  . hydrOXYzine (ATARAX/VISTARIL) 50 MG tablet Take 1 tablet (50 mg total) by mouth at bedtime as needed for anxiety (or insomnia). 30 tablet 0  . lisinopril (PRINIVIL,ZESTRIL) 40 MG tablet Take 1 tablet (40 mg total) by mouth daily. 90 tablet 3  . loratadine (CLARITIN) 10 MG tablet Take 1 tablet (10 mg total) by mouth daily. 30 tablet 2  . LORazepam (ATIVAN) 0.5 MG tablet Take 1 tablet (0.5 mg total) by mouth 3 (three) times daily as needed for anxiety. 30 tablet 0  . Melatonin 5 MG TABS Take 5 mg by mouth at bedtime as needed (for sleep).    . Mouthwashes (BIOTENE DRY MOUTH GENTLE) LIQD Use as directed 1 Dose in the mouth or throat 2 (two) times daily. 1 Bottle 0  . NIFEdipine (ADALAT CC) 90 MG 24 hr tablet Take 1 tablet (90 mg total) by mouth daily. 30 tablet 3  . OXYGEN Inhale 1.5-2 L into the lungs continuous.     . polyethylene glycol (MIRALAX / GLYCOLAX) packet Take 17 g by mouth daily. 14 each 0  . potassium chloride (K-DUR) 10 MEQ tablet Take 1 tablet (10 mEq total) daily by mouth. (Patient taking differently: Take 10 mEq by mouth every other day. ) 30 tablet 5  . PREVIDENT 5000 SENSITIVE 1.1-5 % PSTE Use daily as directed  1  . senna-docusate (SENOKOT-S) 8.6-50 MG tablet Take 1 tablet by mouth at bedtime as needed for mild constipation. 30 tablet 0  . sodium chloride (OCEAN) 0.65 % SOLN nasal spray Place 1 spray into both nostrils as needed for congestion. 88 mL 0  . Tetrahydrozoline HCl (VISINE OP) Place 2 drops into both eyes daily as needed (for dry eyes).     [name] umeclidinium bromide (INCRUSE ELLIPTA) 62.5 MCG/INH AEPB Inhale 1 puff into the lungs daily. Do not take if also taking Trelegy 30 each 1  .  XARELTO 20 MG TABS tablet TAKE 1 TABLET(20 MG) BY MOUTH DAILY WITH SUPPER 30 tablet 5   No current facility-administered medications for this visit.     Allergies:   Tree  extract and Ciprofloxacin    Social History:  The patient  reports that she has never smoked. She has never used smokeless tobacco. She reports that she drinks alcohol. She reports that she does not use drugs.   Family History:  The patient's family history includes Asthma in her mother; Diabetes in her sister; Heart failure in her mother; Heart murmur in her brother, sister, and sister; Thyroid disease in her sister.    ROS:  Please see the history of present illness.   Otherwise, review of systems are positive for none   All other systems are reviewed and negative.   PHYSICAL EXAM: VS:  There were no vitals taken for this visit. , BMI There is no height or weight on file to calculate BMI. GENERAL:  Well appearing HEENT: Pupils equal round and reactive, fundi not visualized, oral mucosa unremarkable NECK:  No jugular venous distention, waveform within normal limits, carotid upstroke brisk and symmetric, no bruits LUNGS:  Clear to auscultation bilaterally HEART:  RRR.  PMI not displaced or sustained,S1 and S2 within normal limits, no S3, no S4, no clicks, no rubs, no murmurs ABD:  Flat, positive bowel sounds normal in frequency in pitch, no bruits, no rebound, no guarding, no midline pulsatile mass, no hepatomegaly, no splenomegaly EXT:  2 plus pulses throughout, no edema, no cyanosis no clubbing.  lipodistrophy SKIN:  No rashes no nodules NEURO:  Cranial nerves II through XII grossly intact, motor grossly intact throughout PSYCH:  Cognitively intact, oriented to person place and time  EKG:  EKG is not ordered today. The ekg ordered 12/31/15 shows sinus rhythm. Rate 89 bpm. 2 PVCs. 10/28/16: Sinus rhythm. Rate 93 bpm 07/01/17: Sinus tachycardia.  Rate 115 bpm.  LAD.    Coronary angiography 06/01/12: 10-20% OM, diffuse 20-30% LAD, 10-20% D1, 20% diffuse RCA RA 10, RV 39/12, RV EDP 12, capillary wedge pressure 19, PA 39/21, mean 27, LV 116/14, LVEDP 20, aorta 115/77 Cardiac output (Fick) 6.55,  cardiac index 3.2 PVR 1.7 with units, SVR 12 with units. She P/QRS ratio 1 LVEF 80%. Aortic root mildly dilated. 1-2+ aortic regurgitation.  24-hour Holter 09/26/13: Sinus rhythm, rare PACs, rare PVCs  Echo 01/22/15: LVEF 70%. Mild LVH. Mild diastolic dysfunction. Normal RV function. Left atrium moderately enlarged. Right atrium mildly enlarged. Trace mitral regurgitation. Calcific aortic valve. Mild to moderate aortic regurgitation. Mild aortic stenosis. Peak velocity 2.1 m/s trace tricuspid regurgitation    Lexiscan Myoview 01/2016:   The left ventricular ejection fraction is normal (55-65%). The EF is 60% visually. The computer generated EF is not calculated correctly and is therefore not reported.  There was no ST segment deviation noted during stress.  The study is normal. no ischemia . no infarction   Echo 01/29/16: Study Conclusions  - Left ventricle: The cavity size was normal. Wall thickness was   increased in a pattern of moderate LVH. Systolic function was   normal. The estimated ejection fraction was in the range of 60%   to 65%. Basal inferior hypokinesis. Doppler parameters are   consistent with abnormal left ventricular relaxation (grade 1   diastolic dysfunction). - Aortic valve: Trileaflet; moderately calcified leaflets. There   was very mild stenosis. There was mild regurgitation. Mean   gradient (S): 9 mm Hg. Valve area (  VTI): 1.68 cm^2. - Mitral valve: There was trivial regurgitation. - Left atrium: The atrium was mildly dilated. - Right ventricle: The cavity size was normal. Systolic function   was normal. - Tricuspid valve: Peak RV-RA gradient (S): 44 mm Hg. - Pulmonary arteries: PA peak pressure: 52 mm Hg (S). - Systemic veins: IVC measured 2.3 cm with > 50% respirophasic   variation, suggesting RA pressure 8 mmHg.  Impressions:  - Normal LV size with moderate LV hypertrophy. EF 60-65%. Basal   inferior hypokinesis. Normal RV size and systolic function.  Very   mild aortic stenosis, mild aortic insufficieny. Moderate   pulmonary hypertension.   Recent Labs: 08/25/2017: TSH 0.475 03/23/2018: B Natriuretic Peptide 106.5 03/24/2018: Hemoglobin 12.3; Platelets 188 05/03/2018: ALT 16; BUN 17; Creatinine, Ser 1.22; Potassium 4.2; Sodium 140    Lipid Panel    Component Value Date/Time   CHOL 253 (H) 05/03/2018 1043   TRIG 53 05/03/2018 1043   HDL 86 05/03/2018 1043   CHOLHDL 2.9 05/03/2018 1043   CHOLHDL 3.2 11/26/2016 1618   VLDL 18 11/26/2016 1618   LDLCALC 156 (H) 05/03/2018 1043      Wt Readings from Last 3 Encounters:  05/03/18 224 lb (101.6 kg)  04/26/18 229 lb 14.4 oz (104.3 kg)  03/23/18 230 lb 6.1 oz (104.5 kg)      ASSESSMENT AND PLAN:  # Atrial flutter: Converted with flecainide.  Continue diltiazem and Xarelto.   # Hypertension:  BP remains above goal.  Interestingly, her blood pressure was well-controlled in the hospital after stopping carvedilol and nifedipine.  However her blood pressure has since increased.  I suspect that some of this is due to constant pain.  She is very reluctant to start any more medications.  However she agreed to take clonidine 0.1 mg twice daily as needed if her SBP is greater than 160.  She will also follow-up with neurology to see if there are any treatments for her neuropathic pain.  Continue nifedipine, diltiazem, and lisinopril.  # Aortic regurgitation # Aortic stenosis: Ms. Cashin has very mild aortic stenosis (mean gradient 9 mmHg) and mild aortic regurgitation.  Continue to monitor.    # Hyperlipidemia: Check lipids and CMP.  # Ascending aorta aneurysm: 4.0 on CT 12/2016.  Will re-assess at follow up.      Current medicines are reviewed at length with the patient today.  The patient does not have concerns regarding medicines.  The following changes have been made:  None   Labs/ tests ordered today include:   No orders of the defined types were placed in this  encounter.    Disposition:   FU with Yianna Tersigni C. Oval Linsey, MD, [facility] in 2-4 weeks.      Signed, Daisy Lites C. Oval Linsey, MD, [facility]  05/31/2018 7:56 AM    [facility]

## 2018-05-31 NOTE — Telephone Encounter (Signed)
New Message:    Patient is requesting a call back

## 2018-05-31 NOTE — Telephone Encounter (Signed)
Returned pt call. Pt phone rings out

## 2018-05-31 NOTE — Telephone Encounter (Signed)
I called pt back and she reported that she woke up with terrible back pain and a cold so she wanted Dr. Oval Linsey to know why she rescheduled her appt today for 06/07/18.

## 2018-06-01 NOTE — Telephone Encounter (Signed)
Dr Oval Linsey aware

## 2018-06-07 ENCOUNTER — Ambulatory Visit: Payer: Medicare Other | Admitting: Cardiovascular Disease

## 2018-06-12 DIAGNOSIS — J189 Pneumonia, unspecified organism: Secondary | ICD-10-CM | POA: Diagnosis not present

## 2018-06-12 DIAGNOSIS — G4733 Obstructive sleep apnea (adult) (pediatric): Secondary | ICD-10-CM | POA: Diagnosis not present

## 2018-06-16 ENCOUNTER — Ambulatory Visit (INDEPENDENT_AMBULATORY_CARE_PROVIDER_SITE_OTHER): Payer: Medicare Other | Admitting: Pulmonary Disease

## 2018-06-16 ENCOUNTER — Encounter (INDEPENDENT_AMBULATORY_CARE_PROVIDER_SITE_OTHER): Payer: Medicare Other

## 2018-06-16 ENCOUNTER — Encounter: Payer: Self-pay | Admitting: Pulmonary Disease

## 2018-06-16 ENCOUNTER — Telehealth: Payer: Self-pay

## 2018-06-16 DIAGNOSIS — J4541 Moderate persistent asthma with (acute) exacerbation: Secondary | ICD-10-CM | POA: Diagnosis not present

## 2018-06-16 DIAGNOSIS — J9611 Chronic respiratory failure with hypoxia: Secondary | ICD-10-CM | POA: Diagnosis not present

## 2018-06-16 NOTE — Telephone Encounter (Signed)
  I spoke with Shannon [name] and she wants a rx for the omeprazole 40mg  once daily that she used to be on. She said with all her medicines she needs something for her heartburn. May I send this in Sir?

## 2018-06-16 NOTE — Progress Notes (Signed)
   Subjective:    Patient ID: Shannon [name], female    DOB: 13-Jul-1956, 62 y.o.   MRN: [id number]  HPI  62 yo never smokerfollowed forsevere persistent  asthmaand nocturnal hypoxemia on O2 1-2 L/m .  She may have some degree of fixed obstruction HIV dz f/by ID clinic- CD4 670 02/5642, treatment complicated by lipodystrophy  Meds - Symbicort makes her gag. She has various problems with other medications. Prednisone causes weight gain and increase in appetite    Skin test reviewed which shows positive to  [employer], [place] tree mix, [place] and [place] We cleared her to start allergy shots on her last visit but she has not started this yet. Feels like her asthma is doing better.  However she feels dyspneic on walking around [place].  She feels this is related to weight and deconditioning.  Note that weight is unchanged to 30 pounds since her last visit  She request blood work, we reviewed blood work from 04/2018 which shows creatinine is improved to 1.2 and LFTs are stable.  She does report compliance with Breo. She requests portable oxygen concentrator, she is compliant with nocturnal oxygen  She refuses flu shot today   Significant tests/ events reviewed  CTa chest 12/2016 , 01/2017 >neg for PE , multifocal scarring bilaterally PFT (12/04/15) FEV1/FVC 50%, FEV1 0.64 31%.+++BD response. DLCO 66%. Spirometry 09/2017-ratio 59, FEV1 24%, FVC 32% Spirometry 05/2018 ratio 55, FEV1 28%, FVC 38%   Sleep study in 04/2016 was (-) for OSA   Past Medical History:  Diagnosis Date  . Anemia   . Anxiety    HX PANIC ATTACKS  . Arthritis    "starting to; in my hands" (07/09/2015)  . Atrial fibrillation ([place])   . Atrial flutter, paroxysmal ([place])   . Chronic asthma with acute exacerbation    "I have chronic asthma all the time; sometimes exacerbations" (07/09/2015)  . Chronic lower back pain   . Cyst of right kidney    "3 of them; dx'd in ~ 01/2015"  . GERD  (gastroesophageal reflux disease)   . Heart murmur   . History of blood transfusion    "related to my brain surgery I think"  . History of pulmonary embolism 07/09/2015  . HIV antibody positive ([place])   . HIV disease ([place])   . Hyperlipidemia   . Hypertension   . Lipodystrophy   . Mild CAD 2013  . Pneumonia 07/09/2015  . Shingles   . Sleep apnea    "never completed part 2 of study; never wore mask" (07/09/2015)     Review of Systems neg for any significant sore throat, dysphagia, itching, sneezing, nasal congestion or excess/ purulent secretions, fever, chills, sweats, unintended wt loss, pleuritic or exertional cp, hempoptysis, orthopnea pnd or change in chronic leg swelling. Also denies presyncope, palpitations, heartburn, abdominal pain, nausea, vomiting, diarrhea or change in bowel or urinary habits, dysuria,hematuria, rash, arthralgias, visual complaints, headache, numbness weakness or ataxia.     Objective:   Physical Exam   Gen. Pleasant, obese, in no distress ENT - no lesions, no post nasal drip Neck: No JVD, no thyromegaly, no carotid bruits Lungs: no use of accessory muscles, no dullness to percussion, decreased BL , no  rhonchi  Cardiovascular: Rhythm regular, heart sounds  normal, no murmurs or gallops, no peripheral edema Musculoskeletal: No deformities, no cyanosis or clubbing , no tremors        Assessment & Plan:

## 2018-06-16 NOTE — Assessment & Plan Note (Signed)
[facility]. We will ask DME DME to assess when they are close to supplying this

## 2018-06-16 NOTE — Assessment & Plan Note (Signed)
Referral to pulmonary rehab for severe persistent asthma.  Stay on Breo every day, rinse mouth after use. Okay to start on allergy shots

## 2018-06-16 NOTE — Assessment & Plan Note (Signed)
Blood pressure high on arrival on recheck was 150/90. She has not taken his meds this morning and will take it as soon as she goes home  We reviewed labs from 04/2018 which shows improved renal function creatinine down to 1.2

## 2018-06-16 NOTE — Patient Instructions (Signed)
Referral to pulmonary rehab for severe persistent asthma. Breathing test today.  We [facility] concentrator.  Stay on Breo every day, rinse mouth after use. Okay to start on allergy shots

## 2018-06-17 ENCOUNTER — Telehealth (HOSPITAL_COMMUNITY): Payer: Self-pay

## 2018-06-17 MED ORDER — OMEPRAZOLE 40 MG PO CPDR: 40.0000 mg | DELAYED_RELEASE_CAPSULE | Freq: Every day | ORAL | 3 refills | Status: DC

## 2018-06-17 NOTE — Telephone Encounter (Signed)
#  90 with 3 refills on the omeprazole 40 mg daily is okay

## 2018-06-17 NOTE — Telephone Encounter (Signed)
Pt insurance is active and benefits verified through [facility]. Co-pay $0.00, DED $0.00/$0.00 met, out of pocket $6,700.00/$0.00 met, co-insurance 0%. If pt require a pre-authorization, certification and determination. Carl/[employer], 06/17/18 @ 10:17Am, REF# [id number]

## 2018-06-17 NOTE — Telephone Encounter (Signed)
Attempted to call patient in regards to Pulmonary Rehab - LM on VM °

## 2018-06-17 NOTE — Telephone Encounter (Signed)
Spoke with [name] and confirmed pharmacy to send in the rx to. She also made appointment to come see Dr Carlean Purl for her stomach issues and she wants a bone density scan ordered as she has taken prednisone for long periods of time.  They will discuss this at her visit.

## 2018-06-21 ENCOUNTER — Other Ambulatory Visit: Payer: Self-pay | Admitting: Internal Medicine

## 2018-06-21 NOTE — Telephone Encounter (Signed)
She is not supposed to be on this medication anymore.  Reviewed last Cardiology note which confirmed.  She is supposed to be on diltiazem instead for rate control.  Thanks.  Refused.

## 2018-06-23 ENCOUNTER — Ambulatory Visit: Payer: Medicare Other | Admitting: Physician Assistant

## 2018-06-24 ENCOUNTER — Telehealth (HOSPITAL_COMMUNITY): Payer: Self-pay

## 2018-06-24 NOTE — Telephone Encounter (Signed)
Called and spoke with pt in regards to PR - Pt stated she is on the way to class at this time. Scheduled orientation on 08/01/18 at 9:00am. Pt did not choose a class time as she is in the middle of registering for classes now. Pt stated she will call back with class time once figured out. Mailed packet.

## 2018-06-28 DIAGNOSIS — Z1231 Encounter for screening mammogram for malignant neoplasm of breast: Secondary | ICD-10-CM | POA: Diagnosis not present

## 2018-06-29 ENCOUNTER — Encounter: Payer: Self-pay | Admitting: Internal Medicine

## 2018-06-29 ENCOUNTER — Ambulatory Visit (INDEPENDENT_AMBULATORY_CARE_PROVIDER_SITE_OTHER): Payer: Medicare Other | Admitting: Internal Medicine

## 2018-06-29 ENCOUNTER — Ambulatory Visit (HOSPITAL_COMMUNITY)
Admission: RE | Admit: 2018-06-29 | Discharge: 2018-06-29 | Disposition: A | Discharge status: Home / Self Care | Payer: Medicare Other | Source: Ambulatory Visit | Attending: Internal Medicine | Admitting: Internal Medicine

## 2018-06-29 ENCOUNTER — Encounter: Payer: Medicare Other | Admitting: Internal Medicine

## 2018-06-29 ENCOUNTER — Other Ambulatory Visit: Payer: Self-pay

## 2018-06-29 VITALS — BP 125/73 | HR 100 | Temp 98.3°F | Ht 63.0 in | Wt 232.3 lb

## 2018-06-29 DIAGNOSIS — J0101 Acute recurrent maxillary sinusitis: Secondary | ICD-10-CM

## 2018-06-29 DIAGNOSIS — I4892 Unspecified atrial flutter: Secondary | ICD-10-CM

## 2018-06-29 DIAGNOSIS — J9611 Chronic respiratory failure with hypoxia: Secondary | ICD-10-CM

## 2018-06-29 DIAGNOSIS — R079 Chest pain, unspecified: Secondary | ICD-10-CM | POA: Diagnosis not present

## 2018-06-29 DIAGNOSIS — F411 Generalized anxiety disorder: Secondary | ICD-10-CM

## 2018-06-29 DIAGNOSIS — G479 Sleep disorder, unspecified: Secondary | ICD-10-CM

## 2018-06-29 DIAGNOSIS — R0602 Shortness of breath: Secondary | ICD-10-CM | POA: Insufficient documentation

## 2018-06-29 DIAGNOSIS — J0100 Acute maxillary sinusitis, unspecified: Secondary | ICD-10-CM

## 2018-06-29 DIAGNOSIS — K219 Gastro-esophageal reflux disease without esophagitis: Secondary | ICD-10-CM

## 2018-06-29 DIAGNOSIS — I517 Cardiomegaly: Secondary | ICD-10-CM | POA: Insufficient documentation

## 2018-06-29 DIAGNOSIS — B20 Human immunodeficiency virus [HIV] disease: Secondary | ICD-10-CM | POA: Diagnosis not present

## 2018-06-29 DIAGNOSIS — M545 Low back pain: Secondary | ICD-10-CM

## 2018-06-29 DIAGNOSIS — R05 Cough: Secondary | ICD-10-CM | POA: Insufficient documentation

## 2018-06-29 DIAGNOSIS — Z79899 Other long term (current) drug therapy: Secondary | ICD-10-CM

## 2018-06-29 DIAGNOSIS — J45909 Unspecified asthma, uncomplicated: Secondary | ICD-10-CM

## 2018-06-29 DIAGNOSIS — I10 Essential (primary) hypertension: Secondary | ICD-10-CM

## 2018-06-29 MED ORDER — AMOXICILLIN-POT CLAVULANATE 875-125 MG PO TABS: 1.0000 | ORAL_TABLET | Freq: Two times a day (BID) | ORAL | 0 refills | Status: DC

## 2018-06-29 MED ORDER — AMOXICILLIN 875 MG PO TABS: 875.0000 mg | ORAL_TABLET | Freq: Two times a day (BID) | ORAL | 0 refills | Status: DC

## 2018-06-29 NOTE — Patient Instructions (Signed)
Ms. Winecoff - -  For your sinuses today - -  Please take Augmentin as prescribed for 5 days. .  Please get a chest xray today to make sure this is not a developing pneumonia.    Please come back to see me in 2 months in January for your chronic health issues.   If you are not better by the time you finish the antibiotics, please make an appointment to be seen here in our clinic.    Thank you!  Amoxicillin; Clavulanic Acid tablets What is this medicine? AMOXICILLIN; CLAVULANIC ACID (a mox i SIL in; KLAV yoo lan ic AS id) is a penicillin antibiotic. It is used to treat certain kinds of bacterial infections. It will not work for colds, flu, or other viral infections. This medicine may be used for other purposes; ask your health care provider or pharmacist if you have questions. COMMON BRAND NAME(S): Augmentin What should I tell my health care provider before I take this medicine? They need to know if you have any of these conditions: -bowel disease, like colitis -kidney disease -liver disease -mononucleosis -an unusual or allergic reaction to amoxicillin, penicillin, cephalosporin, other antibiotics, clavulanic acid, other medicines, foods, dyes, or preservatives -pregnant or trying to get pregnant -breast-feeding How should I use this medicine? Take this medicine by mouth with a full glass of water. Follow the directions on the prescription label. Take at the start of a meal. Do not crush or chew. If the tablet has a score line, you may cut it in half at the score line for easier swallowing. Take your medicine at regular intervals. Do not take your medicine more often than directed. Take all of your medicine as directed even if you think you are better. Do not skip doses or stop your medicine early. Talk to your pediatrician regarding the use of this medicine in children. Special care may be needed. Overdosage: If you think you have taken too much of this medicine contact a poison control  center or emergency room at once. NOTE: This medicine is only for you. Do not share this medicine with others. What if I miss a dose? If you miss a dose, take it as soon as you can. If it is almost time for your next dose, take only that dose. Do not take double or extra doses. What may interact with this medicine? -allopurinol -anticoagulants -birth control pills -methotrexate -probenecid This list may not describe all possible interactions. Give your health care provider a list of all the medicines, herbs, non-prescription drugs, or dietary supplements you use. Also tell them if you smoke, drink alcohol, or use illegal drugs. Some items may interact with your medicine. What should I watch for while using this medicine? Tell your doctor or health care professional if your symptoms do not improve. Do not treat diarrhea with over the counter products. Contact your doctor if you have diarrhea that lasts more than 2 days or if it is severe and watery. If you have diabetes, you may get a false-positive result for sugar in your urine. Check with your doctor or health care professional. Birth control pills may not work properly while you are taking this medicine. Talk to your doctor about using an extra method of birth control. What side effects may I notice from receiving this medicine? Side effects that you should report to your doctor or health care professional as soon as possible: -allergic reactions like skin rash, itching or hives, swelling of the face, lips,  or tongue -breathing problems -dark urine -fever or chills, sore throat -redness, blistering, peeling or loosening of the skin, including inside the mouth -seizures -trouble passing urine or change in the amount of urine -unusual bleeding, bruising -unusually weak or tired -white patches or sores in the mouth or throat Side effects that usually do not require medical attention (report to your doctor or health care professional if  they continue or are bothersome): -diarrhea -dizziness -headache -nausea, vomiting -stomach upset -vaginal or anal irritation This list may not describe all possible side effects. Call your doctor for medical advice about side effects. You may report side effects to FDA at 1-800-FDA-1088. Where should I keep my medicine? Keep out of the reach of children. Store at room temperature below 25 degrees C (77 degrees F). Keep container tightly closed. Throw away any unused medicine after the expiration date. NOTE: This sheet is a summary. It may not cover all possible information. If you have questions about this medicine, talk to your doctor, pharmacist, or health care provider.  2018 Elsevier/Gold Standard (2007-11-03 12:04:30)

## 2018-06-29 NOTE — Progress Notes (Signed)
Subjective:    Patient ID: Shannon [name], female    DOB: 05-03-1956, 62 y.o.   MRN: [id number]  CC: 9 month follow up for chronic medical conditions, including asthma, HTN, GERD  HPI  Ms. Lutze is a 62yo woman with PMH of asthma, a flutter, HTN, low back pain, HIV, GERD, GAD who presents for follow up.  I last saw her in February of this year.  She has been seeing her specialists including Cardiology (Dr. Oval Linsey), Pulmonary (Dr. Elsworth Soho), ID ([name]), GI ([name]) and she was seen a few times in our clinic for urgent visits.   Today, Ms. Jeune came in 25 minutes late to her appointment.  She reported feeling ill so she was seen.  She reports overall that she is doing very well, is happy with her doctors and her schooling.  She is back at school at [employer] and happy to be back there.  She reports that she received her mammogram and PAP smear the day before her appointment.  She also has plans to follow up in a [facility] clinic for weight loss and with an allergist in the coming weeks.  She is very happy that her BP is better and she reports taking three medication, diltiazem, lisinopril, nifedipine regularly and clonidine only PRN.    Her main issue that she reports today is worsening sinus issues.  She notes that she has had 2 weeks of muscle aches in her shoulders, chest pain which feels like acid reflux and which has become better with a colon "clean out."  She notes sinus pressure, dizziness, chest tightness and congestion, equilibrium being off and SOB.  She notes that this is the reason she was late, as she had to walk from the parking lot and became SOB.  She is using oxygen at night.  She was seen by Pulmonary, (Dr. Elsworth Soho) in October who was going to see about getting her oxygen during the day.  He has also ordered pulmonary rehab for her, which I think is a good idea.  However, they have called her multiple times and she has not yet scheduled appointments.      Review of Systems    Constitutional: Negative for activity change, appetite change, fever and unexpected weight change.  HENT: Positive for congestion, postnasal drip, rhinorrhea, sinus pressure, sinus pain, sore throat and trouble swallowing. Negative for ear discharge and ear pain.   Eyes: Negative for photophobia and visual disturbance.  Respiratory: Positive for cough, chest tightness and shortness of breath.   Cardiovascular: Positive for palpitations and leg swelling. Negative for chest pain.  Gastrointestinal: Negative for abdominal distention and constipation.  Genitourinary: Negative for difficulty urinating and dysuria.  Musculoskeletal: Positive for arthralgias and back pain.  Skin: Negative for rash and wound.  Neurological: Positive for dizziness and light-headedness. Negative for weakness and headaches.  Psychiatric/Behavioral: Positive for decreased concentration and sleep disturbance. Negative for dysphoric mood.       Objective:   Physical Exam  Constitutional: She is oriented to person, place, and time. She appears well-developed and well-nourished.  HENT:  Head: Normocephalic and atraumatic.  Nose: Mucosal edema, rhinorrhea and sinus tenderness present. No nose lacerations. No epistaxis.  No foreign bodies. Right sinus exhibits maxillary sinus tenderness and frontal sinus tenderness. Left sinus exhibits maxillary sinus tenderness and frontal sinus tenderness.  Mouth/Throat: Uvula is midline. No oral lesions. No uvula swelling. No oropharyngeal exudate or tonsillar abscesses. Tonsils are 1+ on the right. Tonsils  are 1+ on the left. No tonsillar exudate.  Cardiovascular: Normal rate and regular rhythm.  No murmur heard. Pulmonary/Chest:  She has decreased breath sounds throughout.  No crackles or wheezing  Abdominal: Soft. Bowel sounds are normal.  Neurological: She is alert and oriented to person, place, and time.  Psychiatric: She has a normal mood and affect. Her behavior is normal.   Vitals reviewed.    BMET, CBC Today.      Assessment & Plan:  RTC in 2 months.

## 2018-06-30 ENCOUNTER — Encounter (INDEPENDENT_AMBULATORY_CARE_PROVIDER_SITE_OTHER): Payer: Self-pay | Admitting: Family Medicine

## 2018-06-30 ENCOUNTER — Other Ambulatory Visit: Payer: Self-pay | Admitting: *Deleted

## 2018-06-30 ENCOUNTER — Ambulatory Visit (INDEPENDENT_AMBULATORY_CARE_PROVIDER_SITE_OTHER): Payer: Medicare Other | Admitting: Family Medicine

## 2018-06-30 VITALS — BP 138/86 | HR 96 | Ht 63.0 in | Wt 227.0 lb

## 2018-06-30 DIAGNOSIS — R0602 Shortness of breath: Secondary | ICD-10-CM | POA: Insufficient documentation

## 2018-06-30 DIAGNOSIS — Z1331 Encounter for screening for depression: Secondary | ICD-10-CM

## 2018-06-30 DIAGNOSIS — Z6841 Body Mass Index (BMI) 40.0 and over, adult: Secondary | ICD-10-CM

## 2018-06-30 DIAGNOSIS — E538 Deficiency of other specified B group vitamins: Secondary | ICD-10-CM | POA: Insufficient documentation

## 2018-06-30 DIAGNOSIS — E559 Vitamin D deficiency, unspecified: Secondary | ICD-10-CM

## 2018-06-30 DIAGNOSIS — Z0289 Encounter for other administrative examinations: Secondary | ICD-10-CM

## 2018-06-30 DIAGNOSIS — R5383 Other fatigue: Secondary | ICD-10-CM | POA: Diagnosis not present

## 2018-06-30 DIAGNOSIS — R739 Hyperglycemia, unspecified: Secondary | ICD-10-CM | POA: Insufficient documentation

## 2018-06-30 LAB — CBC WITH DIFFERENTIAL/PLATELET
BASOS ABS: 0 10*3/uL (ref 0.0–0.2)
Basos: 0 %
EOS (ABSOLUTE): 0.1 10*3/uL (ref 0.0–0.4)
Eos: 1 %
Hematocrit: 38.6 % (ref 34.0–46.6)
Hemoglobin: 12.5 g/dL (ref 11.1–15.9)
IMMATURE GRANULOCYTES: 0 %
Immature Grans (Abs): 0 10*3/uL (ref 0.0–0.1)
LYMPHS ABS: 1.2 10*3/uL (ref 0.7–3.1)
Lymphs: 22 %
MCH: 30.5 pg (ref 26.6–33.0)
MCHC: 32.4 g/dL (ref 31.5–35.7)
MCV: 94 fL (ref 79–97)
MONOS ABS: 0.4 10*3/uL (ref 0.1–0.9)
Monocytes: 7 %
NEUTROS PCT: 70 %
Neutrophils Absolute: 4 10*3/uL (ref 1.4–7.0)
PLATELETS: 203 10*3/uL (ref 150–450)
RBC: 4.1 x10E6/uL (ref 3.77–5.28)
RDW: 12.9 % (ref 12.3–15.4)
WBC: 5.8 10*3/uL (ref 3.4–10.8)

## 2018-06-30 LAB — BMP8+ANION GAP
ANION GAP: 17 mmol/L (ref 10.0–18.0)
BUN / CREAT RATIO: 13 (ref 12–28)
BUN: 16 mg/dL (ref 8–27)
CHLORIDE: 99 mmol/L (ref 96–106)
CO2: 27 mmol/L (ref 20–29)
Calcium: 10 mg/dL (ref 8.7–10.3)
Creatinine, Ser: 1.22 mg/dL — ABNORMAL HIGH (ref 0.57–1.00)
GFR calc Af Amer: 54 mL/min/{1.73_m2} — ABNORMAL LOW (ref >59)
GFR calc non Af Amer: 47 mL/min/{1.73_m2} — ABNORMAL LOW (ref >59)
GLUCOSE: 134 mg/dL — AB (ref 65–99)
Potassium: 4 mmol/L (ref 3.5–5.2)
SODIUM: 143 mmol/L (ref 134–144)

## 2018-06-30 MED ORDER — DILTIAZEM HCL ER COATED BEADS 240 MG PO CP24: 240.0000 mg | ORAL_CAPSULE | Freq: Every day | ORAL | 3 refills | Status: DC

## 2018-06-30 NOTE — Assessment & Plan Note (Signed)
This is due to asthma.  She is following with Dr. Elsworth Soho in Pulmonary.  She also has sinus issues and allergies, she is due to see an allergist.  I think she likely has an acute sinus issue today and will prescribe Abx.  She has decreased breath sounds and I will get a CXR.   Await daytime oxygen and pulmonary rehab as ordered by Dr. Elsworth Soho.    Treat acute sinusitis today.   Continue regular inhalers.

## 2018-06-30 NOTE — Assessment & Plan Note (Signed)
BP improved today.  She is taking her medications as prescribed, which I think is a big factor.  Previously she was not taking medication before coming to appointments.   Plan Continue diltiazem, lisinopril, nifedipine BMET today.

## 2018-06-30 NOTE — Assessment & Plan Note (Signed)
Following with ID and doing well.  No acute complaints today.  Taking [place] with good results.

## 2018-06-30 NOTE — Assessment & Plan Note (Addendum)
She was last treated in September for something similar.  I think her symptoms and exam are consistent with a new infection.  I do not see that she has ever had any imaging of her sinuses, which might be warranted if she continues to get infections.  She cannot tolerate Augmentin (it is "too strong"), but she will take amoxicillin.    Plan Given decreased breath sounds throughout - CXR today Amoxicillin 875mg  BID for 5 days Consider CT sinuses in future.

## 2018-07-01 ENCOUNTER — Telehealth: Payer: Self-pay | Admitting: Internal Medicine

## 2018-07-01 LAB — COMPREHENSIVE METABOLIC PANEL
ALT: 20 IU/L (ref 0–32)
AST: 22 IU/L (ref 0–40)
Albumin/Globulin Ratio: 1.6 (ref 1.2–2.2)
Albumin: 4.1 g/dL (ref 3.6–4.8)
Alkaline Phosphatase: 86 IU/L (ref 39–117)
BUN/Creatinine Ratio: 13 (ref 12–28)
BUN: 15 mg/dL (ref 8–27)
Bilirubin Total: 0.3 mg/dL (ref 0.0–1.2)
CALCIUM: 10.3 mg/dL (ref 8.7–10.3)
CO2: 28 mmol/L (ref 20–29)
CREATININE: 1.13 mg/dL — AB (ref 0.57–1.00)
Chloride: 97 mmol/L (ref 96–106)
GFR calc Af Amer: 59 mL/min/{1.73_m2} — ABNORMAL LOW (ref >59)
GFR, EST NON AFRICAN AMERICAN: 51 mL/min/{1.73_m2} — AB (ref >59)
Globulin, Total: 2.6 g/dL (ref 1.5–4.5)
Glucose: 87 mg/dL (ref 65–99)
POTASSIUM: 4 mmol/L (ref 3.5–5.2)
Sodium: 139 mmol/L (ref 134–144)
Total Protein: 6.7 g/dL (ref 6.0–8.5)

## 2018-07-01 LAB — VITAMIN B12: Vitamin B-12: 1352 pg/mL — ABNORMAL HIGH (ref 232–1245)

## 2018-07-01 LAB — INSULIN, RANDOM: INSULIN: 26.4 u[IU]/mL — AB (ref 2.6–24.9)

## 2018-07-01 LAB — HEMOGLOBIN A1C
ESTIMATED AVERAGE GLUCOSE: 114 mg/dL
Hgb A1c MFr Bld: 5.6 % (ref 4.8–5.6)

## 2018-07-01 LAB — TSH: TSH: 0.411 u[IU]/mL — ABNORMAL LOW (ref 0.450–4.500)

## 2018-07-01 LAB — T3: T3, Total: 112 ng/dL (ref 71–180)

## 2018-07-01 LAB — T4, FREE: Free T4: 1.18 ng/dL (ref 0.82–1.77)

## 2018-07-01 LAB — VITAMIN D 25 HYDROXY (VIT D DEFICIENCY, FRACTURES): VIT D 25 HYDROXY: 36.5 ng/mL (ref 30.0–100.0)

## 2018-07-01 NOTE — Telephone Encounter (Signed)
Called patient to report her blood work appeared stable and CXR did not show pneumonia.  She was grateful for call.  She confirmed her identity with name and birthday.   Gilles Chiquito, MD

## 2018-07-04 NOTE — Progress Notes (Signed)
.  Office: [phone]  /  Fax: [phone]   HPI:   Chief Complaint: OBESITY  Shannon [name] (MR# [id number]) is a 62 y.o. female who presents on 06/30/2018 for obesity evaluation and treatment. Current BMI is Body mass index is 40.21 kg/m.[name] Shannon [name] has struggled with obesity for years and has been unsuccessful in either losing weight or maintaining long term weight loss. Shannon [name] attended our information session and states she is currently in the action stage of change and ready to dedicate time achieving and maintaining a healthier weight.   Shannon [name] states her desired weight loss is 67-77 lbs she has been heavy most of  her life she started gaining weight after tonsillectomy  her heaviest weight ever was 250-255 lbs she is a picky eater and doesn't like to eat healthier foods  she has significant food cravings issues  she wakes up frquently in the middle of the night to eat she skips meals frequently she is frequently drinking liquids with calories she frequently eats larger portions than normal  she has binge eating behaviors she struggles with emotional eating    Fatigue Shannon [name] feels her energy is lower than it should be. This has worsened with weight gain and has not worsened recently. Shannon [name] admits to daytime somnolence and  admits to waking up still tired. Patient is at risk for obstructive sleep apnea. Patent has a history of symptoms of daytime fatigue and morning headache. Patient generally gets 5 hours of sleep per night, and states they generally have nightime awakenings. Snoring is not present. Apneic episodes are present. Epworth Sleepiness Score is 13.  Dyspnea on exertion Shannon [name] notes increasing shortness of breath with exercising and seems to be worsening over time with weight gain. She has asthma and hypoventilation on oxygen at night. She notes getting out of breath sooner with activity than she used to. This has not gotten worse recently. Shannon [name] denies  orthopnea.  Hyperglycemia Shannon [name] has a history of multiple elevated blood glucose levels without a diagnosis of diabetes. She has no recent A1c. She admits to polyphagia.  Vitamin D Deficiency Shannon [name] has a diagnosis of vitamin D deficiency. She is not on Vit D, she notes fatigue and denies nausea, vomiting or muscle weakness.  Vitamin B12 Deficiency Shannon [name] has a diagnosis of B12 insufficiency and notes fatigue. She is not a vegetarian and does not have a previous diagnosis of pernicious anemia. She does not have a history of weight loss surgery.   Depression Screen Shannon [name]'s Food and Mood (modified PHQ-9) score was  Depression screen PHQ 2/9 06/30/2018  Decreased Interest 2  Down, Depressed, Hopeless 1  PHQ - 2 Score 3  Altered sleeping 3  Tired, decreased energy 3  Change in appetite 1  Feeling bad or failure about yourself  2  Trouble concentrating 1  Moving slowly or fidgety/restless 0  Suicidal thoughts 0  PHQ-9 Score 13  Difficult doing work/chores Somewhat difficult  Some recent data might be hidden    ALLERGIES: Allergies  Allergen Reactions  . Tree Extract Swelling and Other (See Comments)    Swelling to eyes  . Ciprofloxacin Hives    MEDICATIONS: Current Outpatient Medications on File Prior to Visit  Medication Sig Dispense Refill  . acetaminophen (TYLENOL) 325 MG tablet Take 325 mg by mouth every 6 (six) hours as needed (FOR PAIN).    [name] albuterol (PROAIR HFA) 108 (90 Base) MCG/ACT inhaler INHALE 2 PUFFS INTO THE LUNGS EVERY 4 HOURS AS NEEDED FOR WHEEZING OR  SHORTNESS OF BREATH 8 g 0  . amoxicillin (AMOXIL) 875 MG tablet Take 1 tablet (875 mg total) by mouth 2 (two) times daily. 10 tablet 0  . Ascorbic Acid (VITAMIN C) 1000 MG tablet Take 1,000 mg by mouth 2 (two) times a week.     [name] azelastine (OPTIVAR) 0.05 % ophthalmic solution Place 2 drops into both eyes 2 (two) times daily as needed (for swelling or itching).   3  . bictegravir-emtricitabine-tenofovir AF  (BIKTARVY) 50-200-25 MG TABS tablet Take 1 tablet by mouth daily. 30 tablet 11  . BIOTIN PO Take 1 tablet by mouth daily as needed (for supplementation).     . cholecalciferol (VITAMIN D) 1000 units tablet Take 1,000 Units by mouth 2 (two) times a week.     . cloNIDine (CATAPRES) 0.1 MG tablet TAKE 1 TABLET TWICE A DAY AS NEEDED FOR SYSTOLIC BLOOD PRESSURE (TOP NUMBER) GREATER THAN 160 60 tablet 5  . cyanocobalamin (,VITAMIN B-12,) 1000 MCG/ML injection ADMINISTER 1 ML(1000 MCG) IN THE MUSCLE EVERY 30 DAYS 1 mL 5  . fluticasone (FLONASE) 50 MCG/ACT nasal spray Place 1 spray into both nostrils daily. 9.9 g 0  . fluticasone furoate-vilanterol (BREO ELLIPTA) 100-25 MCG/INH AEPB Inhale 1 puff into the lungs daily. Do not take if also taking Trelegy 28 each 1  . hydrOXYzine (ATARAX/VISTARIL) 50 MG tablet Take 1 tablet (50 mg total) by mouth at bedtime as needed for anxiety (or insomnia). 30 tablet 0  . loratadine (CLARITIN) 10 MG tablet Take 1 tablet (10 mg total) by mouth daily. 30 tablet 2  . LORazepam (ATIVAN) 0.5 MG tablet Take 1 tablet (0.5 mg total) by mouth 3 (three) times daily as needed for anxiety. 30 tablet 0  . Melatonin 5 MG TABS Take 5 mg by mouth at bedtime as needed (for sleep).    . Mouthwashes (BIOTENE DRY MOUTH GENTLE) LIQD Use as directed 1 Dose in the mouth or throat 2 (two) times daily. 1 Bottle 0  . NIFEdipine (ADALAT CC) 90 MG 24 hr tablet Take 1 tablet (90 mg total) by mouth daily. 30 tablet 3  . omeprazole (PRILOSEC) 40 MG capsule Take 1 capsule (40 mg total) by mouth daily. 90 capsule 3  . OXYGEN Inhale 1.5-2 L into the lungs continuous.     . polyethylene glycol (MIRALAX / GLYCOLAX) packet Take 17 g by mouth daily. 14 each 0  . PREVIDENT 5000 SENSITIVE 1.1-5 % PSTE Use daily as directed  1  . senna-docusate (SENOKOT-S) 8.6-50 MG tablet Take 1 tablet by mouth at bedtime as needed for mild constipation. 30 tablet 0  . sodium chloride (OCEAN) 0.65 % SOLN nasal spray Place 1  spray into both nostrils as needed for congestion. 88 mL 0  . Tetrahydrozoline HCl (VISINE OP) Place 2 drops into both eyes daily as needed (for dry eyes).     [name] umeclidinium bromide (INCRUSE ELLIPTA) 62.5 MCG/INH AEPB Inhale 1 puff into the lungs daily. Do not take if also taking Trelegy 30 each 1  . XARELTO 20 MG TABS tablet TAKE 1 TABLET(20 MG) BY MOUTH DAILY WITH SUPPER 30 tablet 5  . lisinopril (PRINIVIL,ZESTRIL) 40 MG tablet Take 1 tablet (40 mg total) by mouth daily. 90 tablet 3  . potassium chloride (K-DUR) 10 MEQ tablet Take 1 tablet (10 mEq total) daily by mouth. (Patient taking differently: Take 10 mEq by mouth every other day. ) 30 tablet 5   No current facility-administered medications on file prior to visit.  PAST MEDICAL HISTORY: Past Medical History:  Diagnosis Date  . Anemia   . Anxiety    HX PANIC ATTACKS  . Arthritis    "starting to; in my hands" (07/09/2015)  . Asthma   . Atrial fibrillation ([place])   . Atrial flutter, paroxysmal ([place])   . Bloated abdomen   . CFS (chronic fatigue syndrome)   . Chewing difficulty   . Chronic asthma with acute exacerbation    "I have chronic asthma all the time; sometimes exacerbations" (07/09/2015)  . Chronic lower back pain   . COPD (chronic obstructive pulmonary disease) ([place])   . Cyst of right kidney    "3 of them; dx'd in ~ 01/2015"  . Dyspnea   . GERD (gastroesophageal reflux disease)   . Heart murmur   . History of blood transfusion    "related to my brain surgery I think"  . History of pulmonary embolism 07/09/2015  . HIV antibody positive ([place])   . HIV disease ([place])   . Hyperlipidemia   . Hypertension   . Leg edema   . Lipodystrophy   . Mild CAD 2013  . Palpitations   . Pneumonia 07/09/2015  . Shingles   . Sleep apnea    "never completed part 2 of study; never wore mask" (07/09/2015)  . Vitamin B 12 deficiency   . Vitamin D deficiency     PAST SURGICAL HISTORY: Past Surgical History:  Procedure  Laterality Date  . ABDOMINAL HYSTERECTOMY     "robotic laparosopic"  . BRAIN SURGERY  1974   "brain tumor; benign; on top of my brain; got a plate in there"  . CARDIAC CATHETERIZATION    . TONSILLECTOMY AND ADENOIDECTOMY      SOCIAL HISTORY: Social History   Tobacco Use  . Smoking status: Never Smoker  . Smokeless tobacco: Never Used  Substance Use Topics  . Alcohol use: Yes    Alcohol/week: 0.0 standard drinks    Comment: Rarely.  . Drug use: No    FAMILY HISTORY: Family History  Problem Relation Age of Onset  . Asthma Mother   . Heart failure Mother        cardiomyopathy  . Thyroid disease Mother   . Heart disease Mother   . Obesity Mother   . Heart murmur Sister   . Heart murmur Brother   . Diabetes Sister   . Thyroid disease Sister   . Heart murmur Sister     ROS: Review of Systems  Constitutional: Positive for fever and malaise/fatigue. Negative for weight loss.       + Trouble sleeping  HENT: Positive for sinus pain.        + Difficult or painful swallowing (sometimes) + Dentures + Dry mouth  Eyes: Positive for blurred vision, double vision and redness.       + Vision changes + Wear glasses or contacts + Floaters  Respiratory: Positive for cough, shortness of breath and wheezing.        + Painful or difficulty breathing (sometimes)  Cardiovascular: Negative for orthopnea.       + Chest pain/discomfort (sometimes) + Chest tightness (sometimes) + Difficulty breathing while lying down (sometimes) + Sudden awakening from sleep with shortness of breath + Calf/leg pain with walking + Leg cramping + Very cold feet or hands  Gastrointestinal: Positive for constipation and heartburn.  Musculoskeletal: Positive for back pain.       Negative muscle weakness + Neck stiffness + Muscle or joint pain +  Muscle stiffness  Skin:       + Dryness  Neurological: Positive for dizziness, weakness and headaches.  Endo/Heme/Allergies: Positive for polydipsia.        Negative hypoglycemia Positive polyphagia + Heat/cold intolerance  Psychiatric/Behavioral:       + Stress (sometimes)    PHYSICAL EXAM: Blood pressure 138/86, pulse 96, height 5\' 3"  (1.6 m), weight 227 lb (103 kg), SpO2 93 %. Body mass index is 40.21 kg/m. Physical Exam  Constitutional: She is oriented to person, place, and time. She appears well-developed and well-nourished.  HENT:  Head: Normocephalic and atraumatic.  Nose: Nose normal.  Eyes: EOM are normal. No scleral icterus.  Neck: Normal range of motion. Neck supple. No thyromegaly present.  Cardiovascular: Normal rate and regular rhythm.  Pulmonary/Chest: Effort normal. No respiratory distress.  Abdominal: Soft. There is no tenderness.  + Obesity  Musculoskeletal:  Range of Motion normal in all 4 extremities Trace edema noted in bilateral lower extremities  Neurological: She is alert and oriented to person, place, and time. Coordination normal.  Skin: Skin is warm and dry.  Psychiatric: She has a normal mood and affect. Her behavior is normal.  Vitals reviewed.   RECENT LABS AND TESTS: BMET    Component Value Date/Time   NA 139 06/30/2018 1016   K 4.0 06/30/2018 1016   CL 97 06/30/2018 1016   CO2 28 06/30/2018 1016   GLUCOSE 87 06/30/2018 1016   GLUCOSE 224 (H) 03/24/2018 0238   BUN 15 06/30/2018 1016   CREATININE 1.13 (H) 06/30/2018 1016   CREATININE 1.21 (H) 02/01/2018 1138   CALCIUM 10.3 06/30/2018 1016   GFRNONAA 51 (L) 06/30/2018 1016   GFRNONAA 48 (L) 02/01/2018 1138   GFRAA 59 (L) 06/30/2018 1016   GFRAA 55 (L) 02/01/2018 1138   Lab Results  Component Value Date   HGBA1C 5.6 06/30/2018   Lab Results  Component Value Date   INSULIN 26.4 (H) 06/30/2018   CBC    Component Value Date/Time   WBC 5.8 06/29/2018 0942   WBC 8.3 03/24/2018 0238   RBC 4.10 06/29/2018 0942   RBC 4.09 03/24/2018 0238   HGB 12.5 06/29/2018 0942   HCT 38.6 06/29/2018 0942   PLT 203 06/29/2018 0942   MCV 94  06/29/2018 0942   MCH 30.5 06/29/2018 0942   MCH 30.1 03/24/2018 0238   MCHC 32.4 06/29/2018 0942   MCHC 32.2 03/24/2018 0238   RDW 12.9 06/29/2018 0942   LYMPHSABS 1.2 06/29/2018 0942   MONOABS 0.1 11/02/2017 1409   EOSABS 0.1 06/29/2018 0942   BASOSABS 0.0 06/29/2018 0942   Iron/TIBC/Ferritin/ %Sat No results found for: IRON, TIBC, FERRITIN, IRONPCTSAT Lipid Panel     Component Value Date/Time   CHOL 253 (H) 05/03/2018 1043   TRIG 53 05/03/2018 1043   HDL 86 05/03/2018 1043   CHOLHDL 2.9 05/03/2018 1043   CHOLHDL 3.2 11/26/2016 1618   VLDL 18 11/26/2016 1618   LDLCALC 156 (H) 05/03/2018 1043   Hepatic Function Panel     Component Value Date/Time   PROT 6.7 06/30/2018 1016   ALBUMIN 4.1 06/30/2018 1016   AST 22 06/30/2018 1016   ALT 20 06/30/2018 1016   ALKPHOS 86 06/30/2018 1016   BILITOT 0.3 06/30/2018 1016   BILIDIR <0.1 (L) 08/25/2017 0700   IBILI NOT CALCULATED 08/25/2017 0700      Component Value Date/Time   TSH 0.411 (L) 06/30/2018 1016   Vitamin D No recent labs  ECG  shows NSR with a rate of 90 BPM INDIRECT CALORIMETER done today shows a VO2 of unable to obtain and a REE of unable to obtain. Her calculated basal metabolic rate is 1660 thus her basal metabolic rate is unknown than expected.    ASSESSMENT AND PLAN: Other fatigue - Plan: EKG 12-Lead, T3, T4, free, TSH  Shortness of breath on exertion  Hyperglycemia - Plan: Comprehensive metabolic panel, Hemoglobin A1c, Insulin, random  Vitamin D deficiency - Plan: VITAMIN D 25 Hydroxy (Vit-D Deficiency, Fractures)  B12 nutritional deficiency - Plan: Vitamin B12  Depression screening  Class 3 severe obesity with serious comorbidity and body mass index (BMI) of 40.0 to 44.9 in adult, unspecified obesity type (HCC)  PLAN:  Fatigue Shannon [name] was informed that her fatigue may be related to obesity, depression or many other causes. Labs will be ordered, and in the meanwhile Shannon [name] has agreed to work  on diet, exercise and weight loss to help with fatigue. Proper sleep hygiene was discussed including the need for 7-8 hours of quality sleep each night. A sleep study was not ordered based on symptoms and Epworth score.  Dyspnea on exertion Shannon [name]'s shortness of breath appears to be obesity related and exercise induced. We were unable to finish IC due to claustrophobia.  She has agreed to work on weight loss and gradually increase exercise to treat her exercise induced shortness of breath. If Shannon [name] follows our instructions and loses weight without improvement of her shortness of breath, we will plan to refer to pulmonology. We will monitor this condition regularly. Shannon [name] agrees to this plan.  Hyperglycemia Fasting labs will be obtained and results with be discussed with Shannon [name] in 2 weeks at her follow up visit. In the meanwhile Shannon [name] was started on a lower simple carbohydrate diet and will work on weight loss efforts.  Vitamin D Deficiency Shannon [name] was informed that low vitamin D levels contributes to fatigue and are associated with obesity, breast, and colon cancer. She will follow up for routine testing of vitamin D, at least 2-3 times per year. She was informed of the risk of over-replacement of vitamin D and agrees to not increase her dose unless she discusses this with [place] first. We will check labs today and Shannon [name] agrees to follow up with our clinic in 2 weeks.  Vitamin B12 Deficiency Shannon [name] will work on increasing B12 rich foods in her diet. B12 supplementation was not prescribed today. We will check labs today and Shannon [name] agrees to follow up with our clinic in 2 weeks.  Depression Screen Shannon [name] had a moderately positive depression screening. Depression is commonly associated with obesity and often results in emotional eating behaviors. We will monitor this closely and work on CBT to help improve the non-hunger eating patterns. Referral to Psychology may be required if no improvement  is seen as she continues in our clinic.  Obesity Shannon [name] is currently in the action stage of change and her goal is to continue with weight loss efforts She has agreed to follow the Category 2 plan + 100 calories Banessa has been instructed to work up to a goal of 150 minutes of combined cardio and strengthening exercise per week for weight loss and overall health benefits. We discussed the following Behavioral Modification Strategies today: increasing lean protein intake, decreasing simple carbohydrates  and work on meal planning and easy cooking plans  Pamelia has agreed to follow up with our clinic in 2 weeks. She was informed of the importance of frequent follow up  visits to maximize her success with intensive lifestyle modifications for her multiple health conditions. She was informed we would discuss her lab results at her next visit unless there is a critical issue that needs to be addressed sooner. Preslea agreed to keep her next visit at the agreed upon time to discuss these results.    OBESITY BEHAVIORAL INTERVENTION VISIT  Today's visit was # 1   Starting weight: 227 lbs Starting date: 06/30/18 Today's weight : 227 lbs Today's date: 06/30/2018 Total lbs lost to date: 0 At least 15 minutes were spent on discussing the following behavioral intervention visit.   ASK: We discussed the diagnosis of obesity with [name] today and Aleina agreed to give [place] permission to discuss obesity behavioral modification therapy today.  ASSESS: Lavaya has the diagnosis of obesity and her BMI today is 40.22 Terrian is in the action stage of change   ADVISE: Mykah was educated on the multiple health risks of obesity as well as the benefit of weight loss to improve her health. She was advised of the need for long term treatment and the importance of lifestyle modifications to improve her current health and to decrease her risk of future health problems.  AGREE: Multiple dietary  modification options and treatment options were discussed and  Helvi agreed to follow the recommendations documented in the above note.  ARRANGE: Elona was educated on the importance of frequent visits to treat obesity as outlined per CMS and USPSTF guidelines and agreed to schedule her next follow up appointment today.   I, Trixie Dredge, am acting as transcriptionist for Dennard Nip, MD   I have reviewed the above documentation for accuracy and completeness, and I agree with the above. -Dennard Nip, MD

## 2018-07-05 DIAGNOSIS — R05 Cough: Secondary | ICD-10-CM | POA: Diagnosis not present

## 2018-07-05 DIAGNOSIS — H1045 Other chronic allergic conjunctivitis: Secondary | ICD-10-CM | POA: Diagnosis not present

## 2018-07-05 DIAGNOSIS — J449 Chronic obstructive pulmonary disease, unspecified: Secondary | ICD-10-CM | POA: Diagnosis not present

## 2018-07-05 DIAGNOSIS — J301 Allergic rhinitis due to pollen: Secondary | ICD-10-CM | POA: Diagnosis not present

## 2018-07-12 ENCOUNTER — Ambulatory Visit: Payer: Medicare Other | Admitting: Internal Medicine

## 2018-07-13 ENCOUNTER — Encounter: Payer: Medicare Other | Admitting: Internal Medicine

## 2018-07-13 DIAGNOSIS — J189 Pneumonia, unspecified organism: Secondary | ICD-10-CM | POA: Diagnosis not present

## 2018-07-13 DIAGNOSIS — G4733 Obstructive sleep apnea (adult) (pediatric): Secondary | ICD-10-CM | POA: Diagnosis not present

## 2018-07-14 ENCOUNTER — Encounter (INDEPENDENT_AMBULATORY_CARE_PROVIDER_SITE_OTHER): Payer: Self-pay

## 2018-07-14 ENCOUNTER — Ambulatory Visit (INDEPENDENT_AMBULATORY_CARE_PROVIDER_SITE_OTHER): Payer: Medicare Other | Admitting: Family Medicine

## 2018-07-26 ENCOUNTER — Other Ambulatory Visit: Payer: Self-pay | Admitting: Cardiovascular Disease

## 2018-07-26 ENCOUNTER — Telehealth (HOSPITAL_COMMUNITY): Payer: Self-pay

## 2018-07-26 ENCOUNTER — Ambulatory Visit (INDEPENDENT_AMBULATORY_CARE_PROVIDER_SITE_OTHER): Payer: Medicare Other | Admitting: Family Medicine

## 2018-07-26 DIAGNOSIS — L918 Other hypertrophic disorders of the skin: Secondary | ICD-10-CM | POA: Diagnosis not present

## 2018-07-28 ENCOUNTER — Other Ambulatory Visit: Payer: Self-pay | Admitting: *Deleted

## 2018-07-28 ENCOUNTER — Ambulatory Visit: Payer: Medicare Other | Admitting: Cardiovascular Disease

## 2018-07-28 ENCOUNTER — Ambulatory Visit (INDEPENDENT_AMBULATORY_CARE_PROVIDER_SITE_OTHER): Payer: Medicare Other | Admitting: Family Medicine

## 2018-07-28 ENCOUNTER — Encounter (INDEPENDENT_AMBULATORY_CARE_PROVIDER_SITE_OTHER): Payer: Self-pay | Admitting: Family Medicine

## 2018-07-28 VITALS — BP 157/81 | HR 87 | Ht 63.0 in | Wt 225.0 lb

## 2018-07-28 DIAGNOSIS — R7303 Prediabetes: Secondary | ICD-10-CM

## 2018-07-28 DIAGNOSIS — Z6839 Body mass index (BMI) 39.0-39.9, adult: Secondary | ICD-10-CM | POA: Diagnosis not present

## 2018-07-28 DIAGNOSIS — E559 Vitamin D deficiency, unspecified: Secondary | ICD-10-CM

## 2018-07-28 MED ORDER — FLUTICASONE FUROATE-VILANTEROL 100-25 MCG/INH IN AEPB: 1.0000 | INHALATION_SPRAY | Freq: Every day | RESPIRATORY_TRACT | 1 refills | Status: DC

## 2018-07-28 MED ORDER — CHOLECALCIFEROL 100 MCG (4000 UT) PO CAPS: 1.0000 | ORAL_CAPSULE | Freq: Every day | ORAL | 0 refills | Status: DC

## 2018-07-28 MED ORDER — UMECLIDINIUM BROMIDE 62.5 MCG/INH IN AEPB: 1.0000 | INHALATION_SPRAY | Freq: Every day | RESPIRATORY_TRACT | 1 refills | Status: DC

## 2018-07-31 ENCOUNTER — Other Ambulatory Visit: Payer: Self-pay | Admitting: Internal Medicine

## 2018-08-01 ENCOUNTER — Telehealth (HOSPITAL_COMMUNITY): Payer: Self-pay | Admitting: *Deleted

## 2018-08-01 ENCOUNTER — Ambulatory Visit (HOSPITAL_COMMUNITY): Payer: Medicare Other

## 2018-08-02 NOTE — Progress Notes (Signed)
Office: [phone]  /  Fax: [phone]   HPI:   Chief Complaint: OBESITY Shannon [name] is here to discuss her progress with her obesity treatment plan. She was advised to follow the Category 2 plan + 100 calories and is following her eating plan approximately 0 % of the time. She states she is exercising 0 minutes 0 times per week. Brayleigh struggled to follow her plan closely as she is overextended with school and extra stress. She has been trying to do the portion control and smarter choices plan. Finances are a problem.  Her weight is 225 lb (102.1 kg) today and has had a weight loss of 2 pounds over a period of 2 weeks since her last visit. She has lost 2 lbs since starting treatment with [place].  Pre-Diabetes Shannon [name] has a new diagnosis of pre-diabetes based on her elevated Hgb A1c and was informed this puts her at greater risk of developing diabetes. She is not taking metformin currently and continues to work on diet and exercise to decrease risk of diabetes. She admits polyphagia. She does have central adiposity.  Vitamin D deficiency Virgina has a diagnosis of vitamin D deficiency and is not at goal. She was recently started on OTC vit D 2,000 units daily and denies nausea, vomiting, or muscle weakness.  ALLERGIES: Allergies  Allergen Reactions  . Tree Extract Swelling and Other (See Comments)    Swelling to eyes  . Ciprofloxacin Hives    MEDICATIONS: Current Outpatient Medications on File Prior to Visit  Medication Sig Dispense Refill  . acetaminophen (TYLENOL) 325 MG tablet Take 325 mg by mouth every 6 (six) hours as needed (FOR PAIN).    [name] albuterol (PROAIR HFA) 108 (90 Base) MCG/ACT inhaler INHALE 2 PUFFS INTO THE LUNGS EVERY 4 HOURS AS NEEDED FOR WHEEZING OR SHORTNESS OF BREATH 8 g 0  . amoxicillin (AMOXIL) 875 MG tablet Take 1 tablet (875 mg total) by mouth 2 (two) times daily. 10 tablet 0  . Ascorbic Acid (VITAMIN C) 1000 MG tablet Take 1,000 mg by mouth 2 (two) times a  week.     [name] azelastine (OPTIVAR) 0.05 % ophthalmic solution Place 2 drops into both eyes 2 (two) times daily as needed (for swelling or itching).   3  . bictegravir-emtricitabine-tenofovir AF (BIKTARVY) 50-200-25 MG TABS tablet Take 1 tablet by mouth daily. 30 tablet 11  . BIOTIN PO Take 1 tablet by mouth daily as needed (for supplementation).     . cloNIDine (CATAPRES) 0.1 MG tablet TAKE 1 TABLET TWICE A DAY AS NEEDED FOR SYSTOLIC BLOOD PRESSURE (TOP NUMBER) GREATER THAN 160 60 tablet 5  . cyanocobalamin (,VITAMIN B-12,) 1000 MCG/ML injection ADMINISTER 1 ML(1000 MCG) IN THE MUSCLE EVERY 30 DAYS 1 mL 5  . diltiazem (CARDIZEM CD) 240 MG 24 hr capsule Take 1 capsule (240 mg total) by mouth daily. 90 capsule 3  . fluticasone (FLONASE) 50 MCG/ACT nasal spray Place 1 spray into both nostrils daily. 9.9 g 0  . furosemide (LASIX) 20 MG tablet Take 1 tablet (20 mg total) by mouth daily. Patient take differently (when she needs to). 30 tablet 0  . hydrOXYzine (ATARAX/VISTARIL) 50 MG tablet Take 1 tablet (50 mg total) by mouth at bedtime as needed for anxiety (or insomnia). 30 tablet 0  . loratadine (CLARITIN) 10 MG tablet Take 1 tablet (10 mg total) by mouth daily. 30 tablet 2  . LORazepam (ATIVAN) 0.5 MG tablet Take 1 tablet (0.5 mg total) by mouth 3 (three)  times daily as needed for anxiety. 30 tablet 0  . Melatonin 5 MG TABS Take 5 mg by mouth at bedtime as needed (for sleep).    . Mouthwashes (BIOTENE DRY MOUTH GENTLE) LIQD Use as directed 1 Dose in the mouth or throat 2 (two) times daily. 1 Bottle 0  . NIFEdipine (ADALAT CC) 90 MG 24 hr tablet Take 1 tablet (90 mg total) by mouth daily. 30 tablet 3  . omeprazole (PRILOSEC) 40 MG capsule Take 1 capsule (40 mg total) by mouth daily. 90 capsule 3  . OXYGEN Inhale 1.5-2 L into the lungs continuous.     . polyethylene glycol (MIRALAX / GLYCOLAX) packet Take 17 g by mouth daily. 14 each 0  . potassium chloride (K-DUR) 10 MEQ tablet Take 1 tablet (10 mEq  total) by mouth every other day. 15 tablet 0  . PREVIDENT 5000 SENSITIVE 1.1-5 % PSTE Use daily as directed  1  . senna-docusate (SENOKOT-S) 8.6-50 MG tablet Take 1 tablet by mouth at bedtime as needed for mild constipation. 30 tablet 0  . sodium chloride (OCEAN) 0.65 % SOLN nasal spray Place 1 spray into both nostrils as needed for congestion. 88 mL 0  . Tetrahydrozoline HCl (VISINE OP) Place 2 drops into both eyes daily as needed (for dry eyes).     Alveda Reasons 20 MG TABS tablet TAKE 1 TABLET(20 MG) BY MOUTH DAILY WITH SUPPER 30 tablet 5  . lisinopril (PRINIVIL,ZESTRIL) 40 MG tablet Take 1 tablet (40 mg total) by mouth daily. 90 tablet 3   No current facility-administered medications on file prior to visit.     PAST MEDICAL HISTORY: Past Medical History:  Diagnosis Date  . Anemia   . Anxiety    HX PANIC ATTACKS  . Arthritis    "starting to; in my hands" (07/09/2015)  . Asthma   . Atrial fibrillation ([place])   . Atrial flutter, paroxysmal ([place])   . Bloated abdomen   . CFS (chronic fatigue syndrome)   . Chewing difficulty   . Chronic asthma with acute exacerbation    "I have chronic asthma all the time; sometimes exacerbations" (07/09/2015)  . Chronic lower back pain   . COPD (chronic obstructive pulmonary disease) ([place])   . Cyst of right kidney    "3 of them; dx'd in ~ 01/2015"  . Dyspnea   . GERD (gastroesophageal reflux disease)   . Heart murmur   . History of blood transfusion    "related to my brain surgery I think"  . History of pulmonary embolism 07/09/2015  . HIV antibody positive ([place])   . HIV disease ([place])   . Hyperlipidemia   . Hypertension   . Leg edema   . Lipodystrophy   . Mild CAD 2013  . Palpitations   . Pneumonia 07/09/2015  . Shingles   . Sleep apnea    "never completed part 2 of study; never wore mask" (07/09/2015)  . Vitamin B 12 deficiency   . Vitamin D deficiency     PAST SURGICAL HISTORY: Past Surgical History:  Procedure Laterality Date  .  ABDOMINAL HYSTERECTOMY     "robotic laparosopic"  . BRAIN SURGERY  1974   "brain tumor; benign; on top of my brain; got a plate in there"  . CARDIAC CATHETERIZATION    . TONSILLECTOMY AND ADENOIDECTOMY      SOCIAL HISTORY: Social History   Tobacco Use  . Smoking status: Never Smoker  . Smokeless tobacco: Never Used  Substance Use  Topics  . Alcohol use: Yes    Alcohol/week: 0.0 standard drinks    Comment: Rarely.  . Drug use: No    FAMILY HISTORY: Family History  Problem Relation Age of Onset  . Asthma Mother   . Heart failure Mother        cardiomyopathy  . Thyroid disease Mother   . Heart disease Mother   . Obesity Mother   . Heart murmur Sister   . Heart murmur Brother   . Diabetes Sister   . Thyroid disease Sister   . Heart murmur Sister     ROS: Review of Systems  Constitutional: Positive for weight loss.  Gastrointestinal: Negative for nausea and vomiting.  Musculoskeletal:       Negative for muscle weakness.  Endo/Heme/Allergies:       Positive for polyphagia.    PHYSICAL EXAM: Blood pressure (!) 157/81, pulse 87, height 5\' 3"  (1.6 m), weight 225 lb (102.1 kg), SpO2 94 %. Body mass index is 39.86 kg/m. Physical Exam  Constitutional: She is oriented to person, place, and time. She appears well-developed and well-nourished.  Cardiovascular: Normal rate.  Pulmonary/Chest: Effort normal.  Musculoskeletal: Normal range of motion.  Neurological: She is oriented to person, place, and time.  Skin: Skin is warm and dry.  Psychiatric: She has a normal mood and affect. Her behavior is normal.  Vitals reviewed.   RECENT LABS AND TESTS: BMET    Component Value Date/Time   NA 139 06/30/2018 1016   K 4.0 06/30/2018 1016   CL 97 06/30/2018 1016   CO2 28 06/30/2018 1016   GLUCOSE 87 06/30/2018 1016   GLUCOSE 224 (H) 03/24/2018 0238   BUN 15 06/30/2018 1016   CREATININE 1.13 (H) 06/30/2018 1016   CREATININE 1.21 (H) 02/01/2018 1138   CALCIUM 10.3  06/30/2018 1016   GFRNONAA 51 (L) 06/30/2018 1016   GFRNONAA 48 (L) 02/01/2018 1138   GFRAA 59 (L) 06/30/2018 1016   GFRAA 55 (L) 02/01/2018 1138   Lab Results  Component Value Date   HGBA1C 5.6 06/30/2018   HGBA1C 5.5 02/23/2018   HGBA1C 5.9 (H) 01/05/2017   Lab Results  Component Value Date   INSULIN 26.4 (H) 06/30/2018   CBC    Component Value Date/Time   WBC 5.8 06/29/2018 0942   WBC 8.3 03/24/2018 0238   RBC 4.10 06/29/2018 0942   RBC 4.09 03/24/2018 0238   HGB 12.5 06/29/2018 0942   HCT 38.6 06/29/2018 0942   PLT 203 06/29/2018 0942   MCV 94 06/29/2018 0942   MCH 30.5 06/29/2018 0942   MCH 30.1 03/24/2018 0238   MCHC 32.4 06/29/2018 0942   MCHC 32.2 03/24/2018 0238   RDW 12.9 06/29/2018 0942   LYMPHSABS 1.2 06/29/2018 0942   MONOABS 0.1 11/02/2017 1409   EOSABS 0.1 06/29/2018 0942   BASOSABS 0.0 06/29/2018 0942   Iron/TIBC/Ferritin/ %Sat No results found for: IRON, TIBC, FERRITIN, IRONPCTSAT Lipid Panel     Component Value Date/Time   CHOL 253 (H) 05/03/2018 1043   TRIG 53 05/03/2018 1043   HDL 86 05/03/2018 1043   CHOLHDL 2.9 05/03/2018 1043   CHOLHDL 3.2 11/26/2016 1618   VLDL 18 11/26/2016 1618   LDLCALC 156 (H) 05/03/2018 1043   Hepatic Function Panel     Component Value Date/Time   PROT 6.7 06/30/2018 1016   ALBUMIN 4.1 06/30/2018 1016   AST 22 06/30/2018 1016   ALT 20 06/30/2018 1016   ALKPHOS 86 06/30/2018 1016  BILITOT 0.3 06/30/2018 1016   BILIDIR <0.1 (L) 08/25/2017 0700   IBILI NOT CALCULATED 08/25/2017 0700      Component Value Date/Time   TSH 0.411 (L) 06/30/2018 1016   TSH 0.475 08/25/2017 0700   TSH 0.690 08/08/2015 1345   Results for Shannon [name], Shannon [name] (MRN [id number]) as of 08/02/2018 08:34  Ref. Range 06/30/2018 10:16  Vitamin D, 25-Hydroxy Latest Ref Range: 30.0 - 100.0 ng/mL 36.5   ASSESSMENT AND PLAN: Prediabetes  Vitamin D deficiency - Plan: Cholecalciferol (SM VITAMIN D3) 100 MCG (4000 UT) CAPS  Class 2 severe  obesity with serious comorbidity and body mass index (BMI) of 39.0 to 39.9 in adult, unspecified obesity type [facility])  PLAN:  Pre-Diabetes Shannon [name] will continue to work on weight loss, exercise, and decreasing simple carbohydrates in her diet to help decrease the risk of diabetes. We discussed metformin including benefits and risks. She was informed that eating too many simple carbohydrates or too many calories at one sitting increases the likelihood of GI side effects. Shannon [name] deferred metformin for now and a prescription was not written today. Shannon [name] agreed to get back to her diet prescription and we will recheck in 3 months. She agreed to follow up with [place] as directed to monitor her progress in 2 weeks.  Vitamin D Deficiency Shannon [name] was informed that low vitamin D levels contributes to fatigue and are associated with obesity, breast, and colon cancer. She agrees to increase and  take OTC vitamin D 4,000 units daily and will follow up for routine testing of vitamin D, at least 2-3 times per year. She was informed of the risk of over-replacement of vitamin D and agrees to not increase her dose unless she discusses this with [place] first. We will recheck in 3 months. Shannon [name] agrees to follow up as directed.  I spent > than 50% of the 25 minute visit on counseling as documented in the note.  Obesity Shannon [name] is currently in the action stage of change. As such, her goal is to continue with weight loss efforts. She has agreed to follow the Category 2 plan + 100 calories. Shannon [name] has been instructed to work up to a goal of 150 minutes of combined cardio and strengthening exercise per week for weight loss and overall health benefits. We discussed the following Behavioral Modification Strategies today: increasing lean protein intake, decreasing simple carbohydrates, work on meal planning and easy cooking plans, and holiday eating strategies.   Shannon [name] has agreed to follow up with our clinic in 2 weeks. She  was informed of the importance of frequent follow up visits to maximize her success with intensive lifestyle modifications for her multiple health conditions.   OBESITY BEHAVIORAL INTERVENTION VISIT  Today's visit was # 2   Starting weight: 227 lbs Starting date: 06/30/18 Today's weight : Weight: 225 lb (102.1 kg)  Today's date: 07/28/2018 Total lbs lost to date: 2  ASK: We discussed the diagnosis of obesity with Shannon [name] today and Shannon [name] agreed to give [place] permission to discuss obesity behavioral modification therapy today.  ASSESS: Shannon [name] has the diagnosis of obesity and her BMI today is 39.8. Shannon [name] is in the action stage of change.   ADVISE: Adleigh was educated on the multiple health risks of obesity as well as the benefit of weight loss to improve her health. She was advised of the need for long term treatment and the importance of lifestyle modifications to improve her current health and to decrease her risk of future health  problems.  AGREE: Multiple dietary modification options and treatment options were discussed and Shannon [name] agreed to follow the recommendations documented in the above note.  ARRANGE: [place] was educated on the importance of frequent visits to treat obesity as outlined per CMS and USPSTF guidelines and agreed to schedule her next follow up appointment today.  I, Marcille Blanco, am acting as transcriptionist for Starlyn Skeans, MD  I have reviewed the above documentation for accuracy and completeness, and I agree with the above. -Dennard Nip, MD

## 2018-08-09 DIAGNOSIS — H40013 Open angle with borderline findings, low risk, bilateral: Secondary | ICD-10-CM | POA: Diagnosis not present

## 2018-08-09 DIAGNOSIS — H10413 Chronic giant papillary conjunctivitis, bilateral: Secondary | ICD-10-CM | POA: Diagnosis not present

## 2018-08-10 ENCOUNTER — Ambulatory Visit (INDEPENDENT_AMBULATORY_CARE_PROVIDER_SITE_OTHER): Payer: Medicare Other | Admitting: Family Medicine

## 2018-08-10 ENCOUNTER — Encounter (INDEPENDENT_AMBULATORY_CARE_PROVIDER_SITE_OTHER): Payer: Self-pay

## 2018-08-12 DIAGNOSIS — G4733 Obstructive sleep apnea (adult) (pediatric): Secondary | ICD-10-CM | POA: Diagnosis not present

## 2018-08-12 DIAGNOSIS — J189 Pneumonia, unspecified organism: Secondary | ICD-10-CM | POA: Diagnosis not present

## 2018-08-14 ENCOUNTER — Other Ambulatory Visit: Payer: Self-pay | Admitting: Internal Medicine

## 2018-08-14 DIAGNOSIS — E538 Deficiency of other specified B group vitamins: Secondary | ICD-10-CM

## 2018-08-15 NOTE — Telephone Encounter (Signed)
Next appt scheduled 08/31/18 with PCP.

## 2018-08-22 ENCOUNTER — Other Ambulatory Visit: Payer: Self-pay | Admitting: Internal Medicine

## 2018-08-22 ENCOUNTER — Other Ambulatory Visit: Payer: Self-pay | Admitting: Cardiovascular Disease

## 2018-08-22 ENCOUNTER — Telehealth: Payer: Self-pay | Admitting: Cardiovascular Disease

## 2018-08-22 NOTE — Telephone Encounter (Signed)
Spoke with patient of Dr. Oval Linsey who reports weight gain & LE swelling x2 months. She is unsure how much weight gain she has gained in a set amount of time but knows she has gained about 10lbs. She reports her ankles are swelling and look darker. She reports compliance with medications. Patient does not have diagnosis of HF - last EF was normal by 2018 echo.    Advised patient take additional lasix 20mg  tomorrow, track and record daily weights and bring log to Jan 3 @ 11am visit with Suanne Marker PA. She voiced understanding.

## 2018-08-22 NOTE — Telephone Encounter (Signed)
New message   Pt c/o swelling: STAT is pt has developed SOB within 24 hours  1) How much weight have you gained and in what time span? 10 lbs  2) If swelling, where is the swelling located? Legs and feet  3) Are you currently taking a fluid pill? Yes  4) Are you currently SOB? Some what   5) Do you have a log of your daily weights (if so, list)? Recently started   6) Have you gained 3 pounds in a day or 5 pounds in a week? 5 lbs in  A week   7) Have you traveled recently? No

## 2018-08-25 ENCOUNTER — Ambulatory Visit (INDEPENDENT_AMBULATORY_CARE_PROVIDER_SITE_OTHER): Payer: Medicare Other | Admitting: Physician Assistant

## 2018-08-25 ENCOUNTER — Encounter (INDEPENDENT_AMBULATORY_CARE_PROVIDER_SITE_OTHER): Payer: Self-pay | Admitting: Physician Assistant

## 2018-08-25 VITALS — BP 134/70 | HR 103 | Temp 98.3°F | Ht 63.0 in | Wt 229.0 lb

## 2018-08-25 DIAGNOSIS — F3289 Other specified depressive episodes: Secondary | ICD-10-CM

## 2018-08-25 DIAGNOSIS — I10 Essential (primary) hypertension: Secondary | ICD-10-CM | POA: Diagnosis not present

## 2018-08-25 DIAGNOSIS — Z6841 Body Mass Index (BMI) 40.0 and over, adult: Secondary | ICD-10-CM | POA: Diagnosis not present

## 2018-08-25 DIAGNOSIS — E538 Deficiency of other specified B group vitamins: Secondary | ICD-10-CM | POA: Diagnosis not present

## 2018-08-25 DIAGNOSIS — Z9189 Other specified personal risk factors, not elsewhere classified: Secondary | ICD-10-CM

## 2018-08-25 MED ORDER — CYANOCOBALAMIN 1000 MCG/ML IJ SOLN: INTRAMUSCULAR | 0 refills | Status: DC

## 2018-08-25 NOTE — Progress Notes (Signed)
Office: [phone]  /  Fax: [phone]   HPI:   Chief Complaint: OBESITY Shannon [name] is here to discuss her progress with her obesity treatment plan. She is on the Category 2+ 100 calories plan and is following her eating plan approximately 30 % of the time. She states she is exercising 0 minutes 0 times per week. Shannon [name] struggled to follow the plan over the holidays. She reports not eating enough protein over the last few weeks. She is ready to get back on track. Her weight is 229 lb (103.9 kg) today and has gained 4 lbs since her last visit. She has lost 0 lbs since starting treatment with [place].  Vitamin B12 Deficiency  Shannon [name] has a diagnosis of B12 insufficiency and notes fatigue. This is not a new diagnosis.Shannon [name] is not a vegetarian and does have a previous diagnosis of pernicious anemia. She does not have a history of weight loss surgery.   Depression with emotional eating behaviors Shannon [name] is struggling with emotional and stress eating and using food for comfort to the extent that it is negatively impacting her health. She often snacks when she is not hungry. Shannon [name] sometimes feels she is out of control and then feels guilty that she made poor food choices. She has been working on behavior modification techniques to help reduce her emotional eating and has been somewhat successful. She shows no sign of suicidal or homicidal ideations.  Hypertension KEIRSTIN [name] is a 63 y.o. female with hypertension.  Shannon [name] denies chest pain or shortness of breath on exertion. She is working weight loss to help control her blood pressure with the goal of decreasing her risk of heart attack and stroke. Shannon [name] blood pressure is normal 134/70 .  At risk for cardiovascular disease Shannon [name] is at a higher than average risk for cardiovascular disease due to obesity. She currently denies any chest pain.  Depression screen [name] 2/9 06/30/2018 06/29/2018 02/23/2018 01/07/2018 11/17/2017  Decreased  Interest 2 1 0 0 0  Down, Depressed, Hopeless 1 0 0 0 1  PHQ - 2 Score 3 1 0 0 1  Altered sleeping 3 3 - - -  Tired, decreased energy 3 2 - - -  Change in appetite 1 0 - - -  Feeling bad or failure about yourself  2 0 - - -  Trouble concentrating 1 1 - - -  Moving slowly or fidgety/restless 0 1 - - -  Suicidal thoughts 0 0 - - -  PHQ-9 Score 13 8 - - -  Difficult doing work/chores Somewhat difficult Somewhat difficult - - -  Some recent data might be hidden    Ref. Range 06/30/2018 10:16  Vitamin B12 Latest Ref Range: 232 - 1,245 pg/mL 1,352 (H)    ASSESSMENT AND PLAN:  Vitamin B12 deficiency - Plan: cyanocobalamin (,VITAMIN B-12,) 1000 MCG/ML injection  Essential hypertension  Other depression - with emotional eating  At risk for heart disease  Class 3 severe obesity with serious comorbidity and body mass index (BMI) of 40.0 to 44.9 in adult, unspecified obesity type ([place])  PLAN:  Vitamin B12 deficiently Shannon [name] will work on increasing B12 rich foods in her diet. B12 supplementation was prescribed today. We will refill vitamin B12 for 30 days. Shannon [name] agrees to follow up in our clinic in 3 weeks.  Depression with Emotional Eating Behaviors We discussed behavior modification techniques today and being referred to Dr. Mallie Mussel to help Shannon [name] deal with her emotional eating and depression. [name] agrees  to follow up in our clinic in 3 weeks.  Hypertension We discussed sodium restriction, working on healthy weight loss, and a regular exercise program as the means to achieve improved blood pressure control. Shannon [name] agreed with this plan and agreed to follow up as directed. We will continue to monitor her blood pressure as well as her progress with the above lifestyle modifications. Shannon [name] agrees to continue with weight loss and will continue her blood pressure medications as prescribed, she will watch for signs of hypotension as she continues her lifestyle modifications. Shannon [name]  agrees to follow up with our clinic in 3 weeks.  Cardiovascular risk counseling Shannon [name] was given extended (15 minutes) coronary artery disease prevention counseling today. She is 63 y.o. female and has risk factors for heart disease including obesity. We discussed intensive lifestyle modifications today with an emphasis on specific weight loss instructions and strategies. Pt was also informed of the importance of increasing exercise and decreasing saturated fats to help prevent heart disease.  Obesity Shannon [name] is currently in the action stage of change. As such, her goal is to continue with weight loss efforts She has agreed to follow the Category 2 + 100 calories plan Shannon [name] has been instructed to work up to a goal of 150 minutes of combined cardio and strengthening exercise per week for weight loss and overall health benefits. We discussed the following Behavioral Modification Strategies today: work on meal planning and easy cooking plans, keeping healthy foods in the home and planning for success.  Shannon [name] has agreed to follow up with our clinic in 3 weeks. She was informed of the importance of frequent follow up visits to maximize her success with intensive lifestyle modifications for her multiple health conditions.  ALLERGIES: Allergies  Allergen Reactions  . Tree Extract Swelling and Other (See Comments)    Swelling to eyes  . Ciprofloxacin Hives    MEDICATIONS: Current Outpatient Medications on File Prior to Visit  Medication Sig Dispense Refill  . acetaminophen (TYLENOL) 325 MG tablet Take 325 mg by mouth every 6 (six) hours as needed (FOR PAIN).    [name] albuterol (PROAIR HFA) 108 (90 Base) MCG/ACT inhaler INHALE 2 PUFFS INTO THE LUNGS EVERY 4 HOURS AS NEEDED FOR WHEEZING OR SHORTNESS OF BREATH 8 g 0  . albuterol (PROVENTIL) (2.5 MG/3ML) 0.083% nebulizer solution USE 1 VIAL VIA NEBULIZER EVERY 6 HOURS AS NEEDED FOR WHEEZING OR SHORTNESS OF BREATH 75 mL 0  . amoxicillin (AMOXIL) 875  MG tablet Take 1 tablet (875 mg total) by mouth 2 (two) times daily. 10 tablet 0  . Ascorbic Acid (VITAMIN C) 1000 MG tablet Take 1,000 mg by mouth 2 (two) times a week.     [name] azelastine (OPTIVAR) 0.05 % ophthalmic solution Place 2 drops into both eyes 2 (two) times daily as needed (for swelling or itching).   3  . bictegravir-emtricitabine-tenofovir AF (BIKTARVY) 50-200-25 MG TABS tablet Take 1 tablet by mouth daily. 30 tablet 11  . BIOTIN PO Take 1 tablet by mouth daily as needed (for supplementation).     . Cholecalciferol (SM VITAMIN D3) 100 MCG (4000 UT) CAPS Take 1 capsule (4,000 Units total) by mouth daily. 30 capsule 0  . cloNIDine (CATAPRES) 0.1 MG tablet TAKE 1 TABLET TWICE A DAY AS NEEDED FOR SYSTOLIC BLOOD PRESSURE (TOP NUMBER) GREATER THAN 160 60 tablet 5  . diltiazem (CARDIZEM CD) 240 MG 24 hr capsule Take 1 capsule (240 mg total) by mouth daily. 90 capsule 3  . fluticasone furoate-vilanterol (  BREO ELLIPTA) 100-25 MCG/INH AEPB Inhale 1 puff into the lungs daily. Do not take if also taking Trelegy 28 each 1  . furosemide (LASIX) 20 MG tablet Take 1 tablet (20 mg total) by mouth daily. 90 tablet 2  . hydrOXYzine (ATARAX/VISTARIL) 50 MG tablet Take 1 tablet (50 mg total) by mouth at bedtime as needed for anxiety (or insomnia). 30 tablet 0  . loratadine (CLARITIN) 10 MG tablet Take 1 tablet (10 mg total) by mouth daily. 30 tablet 2  . LORazepam (ATIVAN) 0.5 MG tablet Take 1 tablet (0.5 mg total) by mouth 3 (three) times daily as needed for anxiety. 30 tablet 0  . Melatonin 5 MG TABS Take 5 mg by mouth at bedtime as needed (for sleep).    . Mouthwashes (BIOTENE DRY MOUTH GENTLE) LIQD Use as directed 1 Dose in the mouth or throat 2 (two) times daily. 1 Bottle 0  . NIFEdipine (ADALAT CC) 90 MG 24 hr tablet TAKE 1 TABLET BY MOUTH DAILY 30 tablet 0  . omeprazole (PRILOSEC) 40 MG capsule Take 1 capsule (40 mg total) by mouth daily. 90 capsule 3  . OXYGEN Inhale 1.5-2 L into the lungs continuous.      . polyethylene glycol (MIRALAX / GLYCOLAX) packet Take 17 g by mouth daily. 14 each 0  . potassium chloride (K-DUR) 10 MEQ tablet TAKE 1 TABLET(10 MEQ) BY MOUTH EVERY OTHER DAY 45 tablet 2  . PREVIDENT 5000 SENSITIVE 1.1-5 % PSTE Use daily as directed  1  . senna-docusate (SENOKOT-S) 8.6-50 MG tablet Take 1 tablet by mouth at bedtime as needed for mild constipation. 30 tablet 0  . sodium chloride (OCEAN) 0.65 % SOLN nasal spray Place 1 spray into both nostrils as needed for congestion. 88 mL 0  . Tetrahydrozoline HCl (VISINE OP) Place 2 drops into both eyes daily as needed (for dry eyes).     Alveda Reasons 20 MG TABS tablet TAKE 1 TABLET(20 MG) BY MOUTH DAILY WITH SUPPER 30 tablet 5  . lisinopril (PRINIVIL,ZESTRIL) 40 MG tablet Take 1 tablet (40 mg total) by mouth daily. 90 tablet 3   No current facility-administered medications on file prior to visit.     PAST MEDICAL HISTORY: Past Medical History:  Diagnosis Date  . Anemia   . Anxiety    HX PANIC ATTACKS  . Arthritis    "starting to; in my hands" (07/09/2015)  . Asthma   . Atrial fibrillation ([place])   . Atrial flutter, paroxysmal ([place])   . Bloated abdomen   . CFS (chronic fatigue syndrome)   . Chewing difficulty   . Chronic asthma with acute exacerbation    "I have chronic asthma all the time; sometimes exacerbations" (07/09/2015)  . Chronic lower back pain   . COPD (chronic obstructive pulmonary disease) ([place])   . Cyst of right kidney    "3 of them; dx'd in ~ 01/2015"  . Dyspnea   . GERD (gastroesophageal reflux disease)   . Heart murmur   . History of blood transfusion    "related to my brain surgery I think"  . History of pulmonary embolism 07/09/2015  . HIV antibody positive ([place])   . HIV disease ([place])   . Hyperlipidemia   . Hypertension   . Leg edema   . Lipodystrophy   . Mild CAD 2013  . Palpitations   . Pneumonia 07/09/2015  . Shingles   . Sleep apnea    "never completed part 2 of study; never wore mask"  (  07/09/2015)  . Vitamin B 12 deficiency   . Vitamin D deficiency     PAST SURGICAL HISTORY: Past Surgical History:  Procedure Laterality Date  . ABDOMINAL HYSTERECTOMY     "robotic laparosopic"  . BRAIN SURGERY  1974   "brain tumor; benign; on top of my brain; got a plate in there"  . CARDIAC CATHETERIZATION    . TONSILLECTOMY AND ADENOIDECTOMY      SOCIAL HISTORY: Social History   Tobacco Use  . Smoking status: Never Smoker  . Smokeless tobacco: Never Used  Substance Use Topics  . Alcohol use: Yes    Alcohol/week: 0.0 standard drinks    Comment: Rarely.  . Drug use: No    FAMILY HISTORY: Family History  Problem Relation Age of Onset  . Asthma Mother   . Heart failure Mother        cardiomyopathy  . Thyroid disease Mother   . Heart disease Mother   . Obesity Mother   . Heart murmur Sister   . Heart murmur Brother   . Diabetes Sister   . Thyroid disease Sister   . Heart murmur Sister     ROS: Review of Systems  Constitutional: Positive for malaise/fatigue. Negative for weight loss.  Cardiovascular: Negative for chest pain.  Psychiatric/Behavioral: Positive for depression. Negative for suicidal ideas.       Negative for homicidal ideations    PHYSICAL EXAM: Blood pressure 134/70, pulse (!) 103, temperature 98.3 F (36.8 C), temperature source Oral, height 5\' 3"  (1.6 m), weight 229 lb (103.9 kg), SpO2 95 %. Body mass index is 40.57 kg/m. Physical Exam Vitals signs reviewed.  Constitutional:      Appearance: Normal appearance. She is obese.  Cardiovascular:     Rate and Rhythm: Normal rate.     Pulses: Normal pulses.  Pulmonary:     Effort: Pulmonary effort is normal.  Musculoskeletal: Normal range of motion.  Skin:    General: Skin is warm and dry.  Neurological:     Mental Status: She is alert and oriented to person, place, and time.  Psychiatric:        Mood and Affect: Mood normal.        Behavior: Behavior normal.     RECENT LABS AND  TESTS: BMET    Component Value Date/Time   NA 139 06/30/2018 1016   K 4.0 06/30/2018 1016   CL 97 06/30/2018 1016   CO2 28 06/30/2018 1016   GLUCOSE 87 06/30/2018 1016   GLUCOSE 224 (H) 03/24/2018 0238   BUN 15 06/30/2018 1016   CREATININE 1.13 (H) 06/30/2018 1016   CREATININE 1.21 (H) 02/01/2018 1138   CALCIUM 10.3 06/30/2018 1016   GFRNONAA 51 (L) 06/30/2018 1016   GFRNONAA 48 (L) 02/01/2018 1138   GFRAA 59 (L) 06/30/2018 1016   GFRAA 55 (L) 02/01/2018 1138   Lab Results  Component Value Date   HGBA1C 5.6 06/30/2018   HGBA1C 5.5 02/23/2018   HGBA1C 5.9 (H) 01/05/2017   Lab Results  Component Value Date   INSULIN 26.4 (H) 06/30/2018   CBC    Component Value Date/Time   WBC 5.8 06/29/2018 0942   WBC 8.3 03/24/2018 0238   RBC 4.10 06/29/2018 0942   RBC 4.09 03/24/2018 0238   HGB 12.5 06/29/2018 0942   HCT 38.6 06/29/2018 0942   PLT 203 06/29/2018 0942   MCV 94 06/29/2018 0942   MCH 30.5 06/29/2018 0942   MCH 30.1 03/24/2018 0238   MCHC 32.4  06/29/2018 0942   MCHC 32.2 03/24/2018 0238   RDW 12.9 06/29/2018 0942   LYMPHSABS 1.2 06/29/2018 0942   MONOABS 0.1 11/02/2017 1409   EOSABS 0.1 06/29/2018 0942   BASOSABS 0.0 06/29/2018 0942   Iron/TIBC/Ferritin/ %Sat No results found for: IRON, TIBC, FERRITIN, IRONPCTSAT Lipid Panel     Component Value Date/Time   CHOL 253 (H) 05/03/2018 1043   TRIG 53 05/03/2018 1043   HDL 86 05/03/2018 1043   CHOLHDL 2.9 05/03/2018 1043   CHOLHDL 3.2 11/26/2016 1618   VLDL 18 11/26/2016 1618   LDLCALC 156 (H) 05/03/2018 1043   Hepatic Function Panel     Component Value Date/Time   PROT 6.7 06/30/2018 1016   ALBUMIN 4.1 06/30/2018 1016   AST 22 06/30/2018 1016   ALT 20 06/30/2018 1016   ALKPHOS 86 06/30/2018 1016   BILITOT 0.3 06/30/2018 1016   BILIDIR <0.1 (L) 08/25/2017 0700   IBILI NOT CALCULATED 08/25/2017 0700      Component Value Date/Time   TSH 0.411 (L) 06/30/2018 1016   TSH 0.475 08/25/2017 0700   TSH 0.690  08/08/2015 1345      OBESITY BEHAVIORAL INTERVENTION VISIT  Today's visit was # 3   Starting weight: 227 lbs Starting date: 06/30/2018 Today's weight : 229 lbs Today's date: 08/25/2018 Total lbs lost to date: 0  ASK: We discussed the diagnosis of obesity with [name] today and [name] agreed to give [place] permission to discuss obesity behavioral modification therapy today.  ASSESS: Hattie has the diagnosis of obesity and her BMI today is 40.58 Sylvia is in the action stage of change   ADVISE: Jadelynn was educated on the multiple health risks of obesity as well as the benefit of weight loss to improve her health. She was advised of the need for long term treatment and the importance of lifestyle modifications to improve her current health and to decrease her risk of future health problems.  AGREE: Multiple dietary modification options and treatment options were discussed and  Makell agreed to follow the recommendations documented in the above note.  ARRANGE: Natosha was educated on the importance of frequent visits to treat obesity as outlined per CMS and USPSTF guidelines and agreed to schedule her next follow up appointment today.  I, Tammy Wysor, am acting as Location manager for [employer], PA-C I, Abby Potash, PA-C have reviewed above note and agree with its content

## 2018-08-26 ENCOUNTER — Ambulatory Visit (INDEPENDENT_AMBULATORY_CARE_PROVIDER_SITE_OTHER): Payer: Medicare Other | Admitting: Physician Assistant

## 2018-08-26 ENCOUNTER — Encounter: Payer: Self-pay | Admitting: Physician Assistant

## 2018-08-26 VITALS — BP 163/96 | HR 97 | Ht 63.0 in | Wt 230.8 lb

## 2018-08-26 DIAGNOSIS — I10 Essential (primary) hypertension: Secondary | ICD-10-CM | POA: Diagnosis not present

## 2018-08-26 DIAGNOSIS — Z79899 Other long term (current) drug therapy: Secondary | ICD-10-CM

## 2018-08-26 DIAGNOSIS — I5031 Acute diastolic (congestive) heart failure: Secondary | ICD-10-CM

## 2018-08-26 DIAGNOSIS — J4520 Mild intermittent asthma, uncomplicated: Secondary | ICD-10-CM

## 2018-08-26 MED ORDER — FUROSEMIDE 40 MG PO TABS: 40.0000 mg | ORAL_TABLET | Freq: Every day | ORAL | 3 refills | Status: DC

## 2018-08-26 MED ORDER — POTASSIUM CHLORIDE CRYS ER 20 MEQ PO TBCR: 20.0000 meq | EXTENDED_RELEASE_TABLET | Freq: Every day | ORAL | 3 refills | Status: DC

## 2018-08-26 NOTE — Patient Instructions (Addendum)
Medication Instructions:  Your physician has recommended you make the following change in your medication:  1.  INCREASE the Lasix to 40 mg daily 2.  INCREASE the Potassium to 20 meq daily  Remember, you have there Clonidine to take as needed  If you need a refill on your cardiac medications before your next appointment, please call your pharmacy.   Lab work: TODAY:  BMET 1 WEEK:  BMET  If you have labs (blood work) drawn today and your tests are completely normal, you will receive your results only by: [name] MyChart Message (if you have MyChart) OR . A paper copy in the mail If you have any lab test that is abnormal or we need to change your treatment, we will call you to review the results.  Testing/Procedures: None ordered  Follow-Up: Your physician recommends that you schedule a follow-up appointment in: BP CHECK AND MEDICATION MANAGEMENT WITH THE PHARMACIST IN 1 WEEK.  PT MUST BRING ALL OF HER MEDICATION BOTTLES WITH HER TO THIS VISIT   Your physician recommends that you schedule a follow-up appointment in: [place]. [place] ON 09/19/18.  Any Other Special Instructions Will Be Listed Below (If Applicable). YOU NEED TO LIMIT THE SODIUM INTAKE TO 500 MG PER MEAL YOU NEED TO LIMIT YOUR FLUID INTAKE TO NO MORE THAN 2 LITERS DAILY...THIS INCLUDES ALL FLUID   Daily Weight Record It is important to weigh yourself daily. To do this:  Make sure you use a reliable scale. Use the same scale each day.  Keep this daily weight chart near your scale.  Weigh yourself each morning at the same time.  Before weighing yourself: ? Take off your shoes. ? Make sure you are wearing the same amount of clothing each day.  Write down your weight in the spaces on the form.  Compare today's weight to yesterday's weight.  Bring this form with you to your follow-up visits with your health care provider. Call your health care provider if you have concerns about your weight,  including rapid weight gain or loss. Date: ________ Weight: ____________________ Date: ________ Weight: ____________________ Date: ________ Weight: ____________________ Date: ________ Weight: ____________________ Date: ________ Weight: ____________________ Date: ________ Weight: ____________________ Date: ________ Weight: ____________________ Date: ________ Weight: ____________________ Date: ________ Weight: ____________________ Date: ________ Weight: ____________________ Date: ________ Weight: ____________________ Date: ________ Weight: ____________________ Date: ________ Weight: ____________________ Date: ________ Weight: ____________________ Date: ________ Weight: ____________________ Date: ________ Weight: ____________________ Date: ________ Weight: ____________________ Date: ________ Weight: ____________________ Date: ________ Weight: ____________________ Date: ________ Weight: ____________________ Date: ________ Weight: ____________________ Date: ________ Weight: ____________________ Date: ________ Weight: ____________________ Date: ________ Weight: ____________________ Date: ________ Weight: ____________________ Date: ________ Weight: ____________________ Date: ________ Weight: ____________________ Date: ________ Weight: ____________________ Date: ________ Weight: ____________________ Date: ________ Weight: ____________________ Date: ________ Weight: ____________________ Date: ________ Weight: ____________________ Date: ________ Weight: ____________________ Date: ________ Weight: ____________________ Date: ________ Weight: ____________________ Date: ________ Weight: ____________________ Date: ________ Weight: ____________________ Date: ________ Weight: ____________________ Date: ________ Weight: ____________________ Date: ________ Weight: ____________________ Date: ________ Weight: ____________________ Date: ________ Weight: ____________________ Date: ________ Weight:  ____________________ Date: ________ Weight: ____________________ Date: ________ Weight: ____________________ Date: ________ Weight: ____________________ Date: ________ Weight: ____________________ Date: ________ Weight: ____________________ Date: ________ Weight: ____________________ Date: ________ Weight: ____________________

## 2018-08-26 NOTE — Progress Notes (Signed)
Cardiology Office Note   Date:  08/26/2018   ID:  Shannon [name], DOB 14-Aug-1956, MRN [id number]  PCP:  [name], MD Cardiologist:  Skeet Latch, MD 05/03/2018 [name], PA-C    History of Present Illness: Shannon [name] is a 63 y.o. female with a history of asthma, Aflutter, HTN, GERD, GIV, GAD, CFS  12/30 phone notes regarding weight gain and lower extremity edema, patient to take an additional Lasix x1 day, record weights and appointment made  Shannon [name] presents for cardiology follow up.   She is supposed to wear O2 at night, has been wearing it more during the day due to SOB.   She has been getting SOB w/ exertion. She wakes up in the night multiple times 2nd SOB.   Her feet are dark, she is concerned because her sister's feet were dark before she died.   She has been noticing LE edema, her legs were very large recently. That has improved. She has bendopnea. She has not been watching the salt very closely. Drinks 1-2 L all liquids daily.   She gets chest pain about every other day. When she eats, she gets SOB. She will then get chest pain, either upper L chest or upper R chest or both. Rarely has mid-chest pain. No hx exertional chest pain. Does have DOE.   She has problems w/ constipation chronically.   BP is elevated today, has not yet had rx today.   Feels her heart pound at times, always regular   Past Medical History:  Diagnosis Date  . Anemia   . Anxiety    HX PANIC ATTACKS  . Arthritis    "starting to; in my hands" (07/09/2015)  . Asthma   . Atrial fibrillation ([place])   . Atrial flutter, paroxysmal ([place])   . Bloated abdomen   . CFS (chronic fatigue syndrome)   . Chewing difficulty   . Chronic asthma with acute exacerbation    "I have chronic asthma all the time; sometimes exacerbations" (07/09/2015)  . Chronic lower back pain   . COPD (chronic obstructive pulmonary disease) ([place])   . Cyst of right kidney    "3 of them; dx'd in ~  01/2015"  . Dyspnea   . GERD (gastroesophageal reflux disease)   . Heart murmur   . History of blood transfusion    "related to my brain surgery I think"  . History of pulmonary embolism 07/09/2015  . HIV antibody positive ([place])   . HIV disease ([place])   . Hyperlipidemia   . Hypertension   . Leg edema   . Lipodystrophy   . Mild CAD 2013  . Palpitations   . Pneumonia 07/09/2015  . Shingles   . Sleep apnea    "never completed part 2 of study; never wore mask" (07/09/2015)  . Vitamin B 12 deficiency   . Vitamin D deficiency     Past Surgical History:  Procedure Laterality Date  . ABDOMINAL HYSTERECTOMY     "robotic laparosopic"  . BRAIN SURGERY  1974   "brain tumor; benign; on top of my brain; got a plate in there"  . CARDIAC CATHETERIZATION    . TONSILLECTOMY AND ADENOIDECTOMY      Current Outpatient Medications  Medication Sig Dispense Refill  . acetaminophen (TYLENOL) 325 MG tablet Take 325 mg by mouth every 6 (six) hours as needed (FOR PAIN).    [name] albuterol (PROAIR HFA) 108 (90 Base) MCG/ACT inhaler INHALE 2 PUFFS INTO  THE LUNGS EVERY 4 HOURS AS NEEDED FOR WHEEZING OR SHORTNESS OF BREATH 8 g 0  . albuterol (PROVENTIL) (2.5 MG/3ML) 0.083% nebulizer solution USE 1 VIAL VIA NEBULIZER EVERY 6 HOURS AS NEEDED FOR WHEEZING OR SHORTNESS OF BREATH 75 mL 0  . amoxicillin (AMOXIL) 875 MG tablet Take 1 tablet (875 mg total) by mouth 2 (two) times daily. 10 tablet 0  . Ascorbic Acid (VITAMIN C) 1000 MG tablet Take 1,000 mg by mouth 2 (two) times a week.     [name] azelastine (OPTIVAR) 0.05 % ophthalmic solution Place 2 drops into both eyes 2 (two) times daily as needed (for swelling or itching).   3  . bictegravir-emtricitabine-tenofovir AF (BIKTARVY) 50-200-25 MG TABS tablet Take 1 tablet by mouth daily. 30 tablet 11  . BIOTIN PO Take 1 tablet by mouth daily as needed (for supplementation).     . Cholecalciferol (SM VITAMIN D3) 100 MCG (4000 UT) CAPS Take 1 capsule (4,000 Units total) by  mouth daily. 30 capsule 0  . cloNIDine (CATAPRES) 0.1 MG tablet TAKE 1 TABLET TWICE A DAY AS NEEDED FOR SYSTOLIC BLOOD PRESSURE (TOP NUMBER) GREATER THAN 160 60 tablet 5  . cyanocobalamin (,VITAMIN B-12,) 1000 MCG/ML injection ADMINISTER 1 ML(1000 MCG) IN THE MUSCLE EVERY 30 DAYS 1 mL 0  . diltiazem (CARDIZEM CD) 240 MG 24 hr capsule Take 1 capsule (240 mg total) by mouth daily. 90 capsule 3  . fluticasone furoate-vilanterol (BREO ELLIPTA) 100-25 MCG/INH AEPB Inhale 1 puff into the lungs daily. Do not take if also taking Trelegy 28 each 1  . furosemide (LASIX) 20 MG tablet Take 1 tablet (20 mg total) by mouth daily. 90 tablet 2  . hydrOXYzine (ATARAX/VISTARIL) 50 MG tablet Take 1 tablet (50 mg total) by mouth at bedtime as needed for anxiety (or insomnia). 30 tablet 0  . lisinopril (PRINIVIL,ZESTRIL) 40 MG tablet Take 1 tablet (40 mg total) by mouth daily. 90 tablet 3  . loratadine (CLARITIN) 10 MG tablet Take 1 tablet (10 mg total) by mouth daily. 30 tablet 2  . LORazepam (ATIVAN) 0.5 MG tablet Take 1 tablet (0.5 mg total) by mouth 3 (three) times daily as needed for anxiety. 30 tablet 0  . Melatonin 5 MG TABS Take 5 mg by mouth at bedtime as needed (for sleep).    . Mouthwashes (BIOTENE DRY MOUTH GENTLE) LIQD Use as directed 1 Dose in the mouth or throat 2 (two) times daily. 1 Bottle 0  . NIFEdipine (ADALAT CC) 90 MG 24 hr tablet TAKE 1 TABLET BY MOUTH DAILY 30 tablet 0  . omeprazole (PRILOSEC) 40 MG capsule Take 1 capsule (40 mg total) by mouth daily. 90 capsule 3  . OXYGEN Inhale 1.5-2 L into the lungs continuous.     . polyethylene glycol (MIRALAX / GLYCOLAX) packet Take 17 g by mouth daily. 14 each 0  . potassium chloride (K-DUR) 10 MEQ tablet TAKE 1 TABLET(10 MEQ) BY MOUTH EVERY OTHER DAY 45 tablet 2  . PREVIDENT 5000 SENSITIVE 1.1-5 % PSTE Use daily as directed  1  . senna-docusate (SENOKOT-S) 8.6-50 MG tablet Take 1 tablet by mouth at bedtime as needed for mild constipation. 30 tablet 0    . sodium chloride (OCEAN) 0.65 % SOLN nasal spray Place 1 spray into both nostrils as needed for congestion. 88 mL 0  . Tetrahydrozoline HCl (VISINE OP) Place 2 drops into both eyes daily as needed (for dry eyes).     Alveda Reasons 20 MG TABS tablet  TAKE 1 TABLET(20 MG) BY MOUTH DAILY WITH SUPPER 30 tablet 5   No current facility-administered medications for this visit.     Allergies:   Tree extract and Ciprofloxacin    Social History:  The patient  reports that she has never smoked. She has never used smokeless tobacco. She reports current alcohol use. She reports that she does not use drugs.   Family History:  The patient's family history includes Asthma in her mother; Diabetes in her sister; Heart disease in her mother; Heart failure in her mother; Heart murmur in her brother, sister, and sister; Obesity in her mother; Thyroid disease in her mother and sister.  She indicated that her mother is deceased. She indicated that only one of her two sisters is alive. She indicated that her brother is alive.    ROS:  Please see the history of present illness. All other systems are reviewed and negative.    PHYSICAL EXAM: VS:  BP (!) 163/96   Pulse 97   Ht 5\' 3"  (1.6 m)   Wt 230 lb 12.8 oz (104.7 kg)   SpO2 90%   BMI 40.88 kg/m  , BMI Body mass index is 40.88 kg/m. GEN: Well nourished, well developed, female in no acute distress HEENT: normal for age  Neck: JVD 9 cm, no carotid bruit, no masses Cardiac: RRR; soft murmur, no rubs, or gallops Respiratory: decreased BS bases bilaterally, normal work of breathing GI: soft, nontender, nondistended, + BS MS: no deformity or atrophy; 1-2+ LE edema; distal pulses are 2+ in all 4 extremities  Skin: warm and dry, no rash.  She has tan lines on her feet and has some chronic stasis changes around her ankles. Neuro:  Strength and sensation are intact Psych: euthymic mood, full affect   EKG:  EKG is not ordered today.   ECHO: 01/06/2017 - Left  ventricle: The cavity size was normal. Wall thickness was   increased in a pattern of mild LVH. Systolic function was normal.   The estimated ejection fraction was in the range of 60% to 65%.   Wall motion was normal; there were no regional wall motion   abnormalities. Doppler parameters are consistent with abnormal   left ventricular relaxation (grade 1 diastolic dysfunction). - Aortic valve: Trileaflet; moderately calcified leaflets.   Sclerosis without stenosis. There was mild to moderate   regurgitation. Mean gradient (S): 8 mm Hg. - Mitral valve: There was no significant regurgitation. - Left atrium: The atrium was moderately dilated. - Right ventricle: The cavity size was normal. Systolic function   was normal. - Right atrium: The atrium was mildly dilated. - Pulmonary arteries: No complete TR doppler jet so unable to   estimate PA systolic pressure. - Systemic veins: IVC measured 2.2 cm with > 50% respirophasic   variation, suggesting RA pressure 8 mmHg. Impressions: - Normal LV size with mild LV hypertrophy. EF 60-65%. Calcified,   trileaflet aortic valve. Sclerosis without significant stenosis,   mild to moderate aortic insufficiency. Normal RV size and   systolic function. Biatrial enlargement.  MYOVIEW: 01/29/2016  The left ventricular ejection fraction is normal (55-65%). The EF is 60% visually. The computer generated EF is not calculated correctly and is therefore not reported.  There was no ST segment deviation noted during stress.  The study is normal. no ischemia . no infarction     Recent Labs: 03/23/2018: B Natriuretic Peptide 106.5 06/29/2018: Hemoglobin 12.5; Platelets 203 06/30/2018: ALT 20; BUN 15; Creatinine, Ser 1.13; Potassium  4.0; Sodium 139; TSH 0.411  CBC    Component Value Date/Time   WBC 5.8 06/29/2018 0942   WBC 8.3 03/24/2018 0238   RBC 4.10 06/29/2018 0942   RBC 4.09 03/24/2018 0238   HGB 12.5 06/29/2018 0942   HCT 38.6 06/29/2018 0942    PLT 203 06/29/2018 0942   MCV 94 06/29/2018 0942   MCH 30.5 06/29/2018 0942   MCH 30.1 03/24/2018 0238   MCHC 32.4 06/29/2018 0942   MCHC 32.2 03/24/2018 0238   RDW 12.9 06/29/2018 0942   LYMPHSABS 1.2 06/29/2018 0942   MONOABS 0.1 11/02/2017 1409   EOSABS 0.1 06/29/2018 0942   BASOSABS 0.0 06/29/2018 0942   CMP Latest Ref Rng & Units 06/30/2018 06/29/2018 05/03/2018  Glucose 65 - 99 mg/dL 87 134(H) 103(H)  BUN 8 - 27 mg/dL 15 16 17   Creatinine 0.57 - 1.00 mg/dL 1.13(H) 1.22(H) 1.22(H)  Sodium 134 - 144 mmol/L 139 143 140  Potassium 3.5 - 5.2 mmol/L 4.0 4.0 4.2  Chloride 96 - 106 mmol/L 97 99 97  CO2 20 - 29 mmol/L 28 27 29   Calcium 8.7 - 10.3 mg/dL 10.3 10.0 10.3  Total Protein 6.0 - 8.5 g/dL 6.7 - 6.8  Total Bilirubin 0.0 - 1.2 mg/dL 0.3 - 0.4  Alkaline Phos 39 - 117 IU/L 86 - 87  AST 0 - 40 IU/L 22 - 21  ALT 0 - 32 IU/L 20 - 16     Lipid Panel Lab Results  Component Value Date   CHOL 253 (H) 05/03/2018   HDL 86 05/03/2018   LDLCALC 156 (H) 05/03/2018   TRIG 53 05/03/2018   CHOLHDL 2.9 05/03/2018      Wt Readings from Last 3 Encounters:  08/26/18 230 lb 12.8 oz (104.7 kg)  08/25/18 229 lb (103.9 kg)  07/28/18 225 lb (102.1 kg)     Other studies Reviewed: Additional studies/ records that were reviewed today include: Office notes, hospital records and testing.  ASSESSMENT AND PLAN:  1.  Acute diast CHF:  - Increase Lasix to 40 mg qd w/ Kdur 20 meq qd -By this time she sees Dr. Daryll Drown next week, she will have been on the new dose for several days.  Check a BMET then or at her next office visit. -Compliance with a low-sodium diet and fluid restrictions is encouraged.  2. HTN: - BP is elevated today, but pt has not taken rx - both Dilt and Procardia are listed on med list, but pt only taking the Procardia, the diltiazem has not been filled since August - follow BP (chart given) on current rx, w/ higher dose Lasix - f/u w/ Pharmacist HTN clinic for further  management. - emphasized the need to bring pill bottles in, may need pill count.   3.  Asthma: -She is not wheezing now. -She has a decrease in her oxygen saturation, but I feel this is mainly related to heart failure. -She already has oxygen at home for nighttime use and as needed. - Once her volume status is improved, reassess oxygen needs per Dr. Elsworth Soho   Current medicines are reviewed at length with the patient today.  The patient has concerns regarding medicines.  The following changes have been made: Increase Lasix and potassium as directed  Labs/ tests ordered today include:   Orders Placed This Encounter  Procedures  . Basic metabolic panel  . Basic metabolic panel     Disposition:   FU with Skeet Latch, MD  Signed, [name],  PA-C  08/26/2018 3:10 PM    [place] Phone: [phone]; Fax: [phone]

## 2018-08-26 NOTE — Telephone Encounter (Signed)
Agree thank you 

## 2018-08-27 LAB — BASIC METABOLIC PANEL
BUN/Creatinine Ratio: 15 (ref 12–28)
BUN: 18 mg/dL (ref 8–27)
CO2: 28 mmol/L (ref 20–29)
Calcium: 9.8 mg/dL (ref 8.7–10.3)
Chloride: 101 mmol/L (ref 96–106)
Creatinine, Ser: 1.18 mg/dL — ABNORMAL HIGH (ref 0.57–1.00)
GFR calc non Af Amer: 49 mL/min/{1.73_m2} — ABNORMAL LOW (ref >59)
GFR, EST AFRICAN AMERICAN: 56 mL/min/{1.73_m2} — AB (ref >59)
Glucose: 83 mg/dL (ref 65–99)
Potassium: 4.3 mmol/L (ref 3.5–5.2)
Sodium: 144 mmol/L (ref 134–144)

## 2018-08-28 ENCOUNTER — Other Ambulatory Visit: Payer: Self-pay | Admitting: Internal Medicine

## 2018-08-30 NOTE — Telephone Encounter (Signed)
Pt has an appt tomorrow with PCP.

## 2018-08-31 ENCOUNTER — Encounter: Payer: Self-pay | Admitting: Internal Medicine

## 2018-08-31 ENCOUNTER — Other Ambulatory Visit: Payer: Self-pay

## 2018-08-31 ENCOUNTER — Ambulatory Visit (INDEPENDENT_AMBULATORY_CARE_PROVIDER_SITE_OTHER): Payer: Medicare Other | Admitting: Internal Medicine

## 2018-08-31 VITALS — BP 156/82 | HR 112 | Temp 98.9°F | Ht 63.0 in | Wt 229.8 lb

## 2018-08-31 DIAGNOSIS — I77810 Thoracic aortic ectasia: Secondary | ICD-10-CM

## 2018-08-31 DIAGNOSIS — I483 Typical atrial flutter: Secondary | ICD-10-CM

## 2018-08-31 DIAGNOSIS — I10 Essential (primary) hypertension: Secondary | ICD-10-CM

## 2018-08-31 DIAGNOSIS — N183 Chronic kidney disease, stage 3 unspecified: Secondary | ICD-10-CM

## 2018-08-31 DIAGNOSIS — E2839 Other primary ovarian failure: Secondary | ICD-10-CM

## 2018-08-31 DIAGNOSIS — I4892 Unspecified atrial flutter: Secondary | ICD-10-CM

## 2018-08-31 DIAGNOSIS — Z79899 Other long term (current) drug therapy: Secondary | ICD-10-CM

## 2018-08-31 DIAGNOSIS — I129 Hypertensive chronic kidney disease with stage 1 through stage 4 chronic kidney disease, or unspecified chronic kidney disease: Secondary | ICD-10-CM | POA: Diagnosis not present

## 2018-08-31 DIAGNOSIS — H5213 Myopia, bilateral: Secondary | ICD-10-CM | POA: Diagnosis not present

## 2018-08-31 DIAGNOSIS — E669 Obesity, unspecified: Secondary | ICD-10-CM

## 2018-08-31 DIAGNOSIS — K047 Periapical abscess without sinus: Secondary | ICD-10-CM | POA: Diagnosis not present

## 2018-08-31 DIAGNOSIS — Z6841 Body Mass Index (BMI) 40.0 and over, adult: Secondary | ICD-10-CM

## 2018-08-31 DIAGNOSIS — J9611 Chronic respiratory failure with hypoxia: Secondary | ICD-10-CM | POA: Diagnosis not present

## 2018-08-31 DIAGNOSIS — Z7901 Long term (current) use of anticoagulants: Secondary | ICD-10-CM

## 2018-08-31 MED ORDER — AMOXICILLIN 875 MG PO TABS: 875.0000 mg | ORAL_TABLET | Freq: Two times a day (BID) | ORAL | 0 refills | Status: DC

## 2018-08-31 MED ORDER — ALBUTEROL SULFATE (2.5 MG/3ML) 0.083% IN NEBU: INHALATION_SOLUTION | RESPIRATORY_TRACT | 6 refills | Status: DC

## 2018-08-31 NOTE — Assessment & Plan Note (Signed)
She follows with cardiology.  Reports compliance with diltiazem and xarelto.  She is tachycardic today, but felt regular on exam.  We went over her medications and I advised her to take all of her blood pressure (including diltiazem) medications every day.  She reported to me that she only took lisinopril and nifedipine this morning.   Plan Continue rate control with diltiazem Continue Xarelto

## 2018-08-31 NOTE — Patient Instructions (Addendum)
Ms. Roads - -  I have ordered you a bone density scan and an image of your chest to look at your aorta.    Please schedule these at your convenience.   For your tooth, please take amoxicillin 875mg  BID for 7 days.  Please go see a dentist within the next week if possible.   Thank you!  Come back to see me in 3-4 months, sooner if you need  Amoxicillin capsules or tablets What is this medicine? AMOXICILLIN (a mox i SIL in) is a penicillin antibiotic. It is used to treat certain kinds of bacterial infections. It will not work for colds, flu, or other viral infections. This medicine may be used for other purposes; ask your health care provider or pharmacist if you have questions. COMMON BRAND NAME(S): Amoxil, Moxilin, Sumox, Trimox What should I tell my health care provider before I take this medicine? They need to know if you have any of these conditions: -asthma -kidney disease -an unusual or allergic reaction to amoxicillin, other penicillins, cephalosporin antibiotics, other medicines, foods, dyes, or preservatives -pregnant or trying to get pregnant -breast-feeding How should I use this medicine? Take this medicine by mouth with a glass of water. Follow the directions on your prescription label. You may take this medicine with food or on an empty stomach. Take your medicine at regular intervals. Do not take your medicine more often than directed. Take all of your medicine as directed even if you think your are better. Do not skip doses or stop your medicine early. Talk to your pediatrician regarding the use of this medicine in children. While this drug may be prescribed for selected conditions, precautions do apply. Overdosage: If you think you have taken too much of this medicine contact a poison control center or emergency room at once. NOTE: This medicine is only for you. Do not share this medicine with others. What if I miss a dose? If you miss a dose, take it as soon as you can.  If it is almost time for your next dose, take only that dose. Do not take double or extra doses. What may interact with this medicine? -amiloride -birth control pills -chloramphenicol -macrolides -probenecid -sulfonamides -tetracyclines This list may not describe all possible interactions. Give your health care provider a list of all the medicines, herbs, non-prescription drugs, or dietary supplements you use. Also tell them if you smoke, drink alcohol, or use illegal drugs. Some items may interact with your medicine. What should I watch for while using this medicine? Tell your doctor or health care professional if your symptoms do not improve in 2 or 3 days. Take all of the doses of your medicine as directed. Do not skip doses or stop your medicine early. If you are diabetic, you may get a false positive result for sugar in your urine with certain brands of urine tests. Check with your doctor. Do not treat diarrhea with over-the-counter products. Contact your doctor if you have diarrhea that lasts more than 2 days or if the diarrhea is severe and watery. What side effects may I notice from receiving this medicine? Side effects that you should report to your doctor or health care professional as soon as possible: -allergic reactions like skin rash, itching or hives, swelling of the face, lips, or tongue -breathing problems -dark urine -redness, blistering, peeling or loosening of the skin, including inside the mouth -seizures -severe or watery diarrhea -trouble passing urine or change in the amount of urine -unusual bleeding  or bruising -unusually weak or tired -yellowing of the eyes or skin Side effects that usually do not require medical attention (report to your doctor or health care professional if they continue or are bothersome): -dizziness -headache -stomach upset -trouble sleeping This list may not describe all possible side effects. Call your doctor for medical advice about  side effects. You may report side effects to FDA at 1-800-FDA-1088. Where should I keep my medicine? Keep out of the reach of children. Store between 68 and 77 degrees F (20 and 25 degrees C). Keep bottle closed tightly. Throw away any unused medicine after the expiration date. NOTE: This sheet is a summary. It may not cover all possible information. If you have questions about this medicine, talk to your doctor, pharmacist, or health care provider.  2019 Elsevier/Gold Standard (2007-11-01 14:10:59)

## 2018-08-31 NOTE — Assessment & Plan Note (Signed)
She has been referred to pulmonary rehab by pulmonology.  She states that when she goes, she always has high blood pressure and they will not let her exercise.  We discussed the importance of taking her BP medications regularly.  She has also been given clonidine which she states is PRN for SBP > 160.  I told her that days of rehab would be a good day to try the PRN medication so that she is able to exercise.    Plan Attempt pulmonary rehab Continue inhalers Follow up as planned with pulmonary.

## 2018-08-31 NOTE — Assessment & Plan Note (Signed)
We discussed this today.  Seen on imaging initially in 2018.  Recommended yearly follow up.  We discussed the benefits of MRA vs. CTA and I think MRA is a better option for her given her renal function.  Her Cr is 1.28, GFR >50 at last check. [employer] recommendation states that gadolinium can be given at GFR > 30).    Plan Order mra with and without of the chest to evaluate dilation for stability.

## 2018-08-31 NOTE — Assessment & Plan Note (Signed)
She has begun seeing a health and wellness clinic and is very excited to start therapy with them.

## 2018-08-31 NOTE — Assessment & Plan Note (Signed)
Creatinine is at baseline on last check a couple of months ago.  She reports normal urination and no changes.   Plan Check BMET at next visit Avoid nephrotoxins

## 2018-08-31 NOTE — Assessment & Plan Note (Signed)
BP remains uncontrolled.  I do not think that she is taking her BP medications every day.  She reports some compliance, but then confusion about when to take the medications.  Agree with Cardiology team that pill count would be useful. I again reminded her to bring her medications at every visit.   Plan Continue diltiazem, nifedipine, lasix, lisinopril

## 2018-08-31 NOTE — Progress Notes (Signed)
Subjective:    Patient ID: Shannon [name], female    DOB: 07-15-1956, 63 y.o.   MRN: [id number]  CC: 2 month follow up for chronic issues including HTN, Aflutter, GERD  HPI  Shannon [name] is a 63 year old woman with PMH of Aflutter, HTN, CAD, asthma, GERD, CKD, obesity who presents for follow up.   She has two acute complaints today, she needs refills on her albuterol nebulizer and she feels she has an abscess on her lower gums.   She notes that she has had tooth problems for a long time, but over the last few days she has had a bump develop below her lower teeth and this is causing her to have a cough, sore throat, hoarse voice, and URI symptoms.  She feels that her tooth is loose.  She has been looking in to seeing a dentist.    She would like to get screening tests done including dexa scan.  She reports having a mammogram done this summer, but notes that this was done at [facility].  [facility] for a few years.  I wonder if she is remembering her exam from 2017.  She also notes doing stool studies which she sends in to Dr. Celesta Aver office.  She further notes pap smears done at her Gynecologist Dr. Sherran Needs office.  I will have the clinic staff contact these offices to get results if possible.   She is confused about her BP medications and we went over them today.  She is hypertensive and mildly tachycardic (regular).  She reports taking her medications.   Review of Systems  Constitutional: Positive for fatigue. Negative for activity change, appetite change and fever.  HENT: Positive for dental problem, rhinorrhea, sore throat and voice change.   Respiratory: Negative for cough and shortness of breath.   Cardiovascular: Negative for chest pain.  Skin: Negative for rash and wound.       Objective:   Physical Exam Constitutional:      General: She is not in acute distress.    Appearance: Normal appearance. She is obese. She is not ill-appearing, toxic-appearing or  diaphoretic.  HENT:     Head: Normocephalic and atraumatic.     Mouth/Throat:     Mouth: Mucous membranes are moist.     Pharynx: No oropharyngeal exudate or posterior oropharyngeal erythema.     Comments: She has a small pustule under her bottom teeth, no draining pus.  She has poor dentition and pain to touch of the front 3 teeth.  Cardiovascular:     Rate and Rhythm: Regular rhythm. Tachycardia present.  Pulmonary:     Effort: Pulmonary effort is normal. No respiratory distress.     Breath sounds: No wheezing.  Musculoskeletal:        General: No swelling.  Skin:    General: Skin is warm and dry.  Neurological:     Mental Status: She is alert.  Psychiatric:        Mood and Affect: Mood normal.        Assessment & Plan:  RTC in 3-4 months, sooner if needed  Dental abscess, mild - She has no systemic symptoms.  She has a very small pocket on lower mouth.  I advised her strongly to see a dentist.  First line Abx are augmentin and clindamycin.  She has only ever  Agreed to amoxicillin as she feels the other two are too strong for her.  - Amoxicillin 875mg   BID for 7 days.  - Dental follow up.

## 2018-09-02 ENCOUNTER — Telehealth: Payer: Self-pay | Admitting: *Deleted

## 2018-09-02 NOTE — Telephone Encounter (Signed)
Pt informed she will need contrast with this imaging and had this contrast before w/o issue per Dr Daryll Drown. Stated her main issue , she does not want her BP to increase.And will not have a problem taking the contrast.

## 2018-09-02 NOTE — Telephone Encounter (Signed)
Per front desk, pt had stated she did not want contrast during MRI/MRA. And she had a type of brain surgery; when asked about metal in her body; she has "plastic". I talked to pt - stated she prefers no contrast but if they have to use it, she's agreeable. Front desk will schedule.

## 2018-09-02 NOTE — Telephone Encounter (Signed)
The type of image she needs must have contrast.  Other option is a CTA.  She has had this type of contrast before without issue.    Please give her this information.  If she is still concerned, I can call her next week to further discuss.
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
## 2018-09-09 ENCOUNTER — Encounter: Payer: Self-pay | Admitting: Internal Medicine

## 2018-09-09 ENCOUNTER — Ambulatory Visit (HOSPITAL_COMMUNITY)
Admission: RE | Admit: 2018-09-09 | Discharge: 2018-09-09 | Disposition: A | Discharge status: Home / Self Care | Payer: Medicare Other | Source: Ambulatory Visit | Attending: Internal Medicine | Admitting: Internal Medicine

## 2018-09-09 ENCOUNTER — Other Ambulatory Visit: Payer: Self-pay

## 2018-09-09 ENCOUNTER — Ambulatory Visit (INDEPENDENT_AMBULATORY_CARE_PROVIDER_SITE_OTHER): Payer: Medicare Other | Admitting: Internal Medicine

## 2018-09-09 VITALS — BP 117/68 | HR 76 | Temp 99.1°F | Ht 63.0 in | Wt 224.9 lb

## 2018-09-09 DIAGNOSIS — Z9981 Dependence on supplemental oxygen: Secondary | ICD-10-CM | POA: Diagnosis not present

## 2018-09-09 DIAGNOSIS — J441 Chronic obstructive pulmonary disease with (acute) exacerbation: Secondary | ICD-10-CM | POA: Diagnosis not present

## 2018-09-09 DIAGNOSIS — J9611 Chronic respiratory failure with hypoxia: Secondary | ICD-10-CM

## 2018-09-09 DIAGNOSIS — Z79899 Other long term (current) drug therapy: Secondary | ICD-10-CM | POA: Diagnosis not present

## 2018-09-09 DIAGNOSIS — R05 Cough: Secondary | ICD-10-CM | POA: Diagnosis not present

## 2018-09-09 MED ORDER — PREDNISONE 20 MG PO TABS: 40.0000 mg | ORAL_TABLET | Freq: Every day | ORAL | 0 refills | Status: DC

## 2018-09-09 MED ORDER — METHYLPREDNISOLONE SODIUM SUCC 125 MG IJ SOLR
125.0000 mg | Freq: Once | INTRAMUSCULAR | Status: AC
  Administered 2018-09-09: 125 mg via INTRAMUSCULAR

## 2018-09-09 MED ORDER — ALBUTEROL SULFATE (2.5 MG/3ML) 0.083% IN NEBU
2.5000 mg | INHALATION_SOLUTION | Freq: Once | RESPIRATORY_TRACT | Status: AC
  Administered 2018-09-09: 2.5 mg via RESPIRATORY_TRACT

## 2018-09-09 MED ORDER — ALBUTEROL SULFATE (2.5 MG/3ML) 0.083% IN NEBU: INHALATION_SOLUTION | RESPIRATORY_TRACT | 6 refills | Status: DC

## 2018-09-09 NOTE — Patient Instructions (Addendum)
FOLLOW-UP INSTRUCTIONS When: If your symptoms worsen or fail to improve What to bring: All of your medications   I have recommended that you use the Breo, continue the albuterol nebulizer at least 4 times per day, take the steroids at 40mg  daily for 5 more days.  Your chest X-ray did not show any new findings or concerns.   As always if your symptoms worsen, fail to improve, or you develop other concerning symptoms, please notify our office or visit the local ER if we are unavailable. Symptoms including fever, chills, shortness of breath that worsens below baseline or that doesn't improve with your breathing treatments or steroids, the inability to care for yourself and worsening symptoms should also not be ignored. I would encourage you to visit the ED if we are unavailable by phone or the symptoms are severe.  Thank you for your visit to the [facility] today. If you have any questions or concerns please call us at [phone].

## 2018-09-09 NOTE — Progress Notes (Addendum)
   CC: cold like symptoms  HPI:[name] is a 63 y.o. female who presents for evaluation of cold like symptoms. Please see individual problem based A/P for details.   PHQ-9: Based on the patients    Office Visit from 08/31/2018 in [place]  PHQ-9 Total Score  3     score we have decided to monitor.  Past Medical History:  Diagnosis Date  . Anemia   . Anxiety    HX PANIC ATTACKS  . Arthritis    "starting to; in my hands" (07/09/2015)  . Asthma   . Atrial fibrillation (Shannon [name])   . Atrial flutter, paroxysmal (Shannon [name])   . Bloated abdomen   . CFS (chronic fatigue syndrome)   . Chewing difficulty   . Chronic asthma with acute exacerbation    "I have chronic asthma all the time; sometimes exacerbations" (07/09/2015)  . Chronic lower back pain   . COPD (chronic obstructive pulmonary disease) ([place])   . Cyst of right kidney    "3 of them; dx'd in ~ 01/2015"  . Dyspnea   . GERD (gastroesophageal reflux disease)   . Heart murmur   . History of blood transfusion    "related to my brain surgery I think"  . History of pulmonary embolism 07/09/2015  . HIV antibody positive ([place])   . HIV disease ([place])   . Hyperlipidemia   . Hypertension   . Leg edema   . Lipodystrophy   . Mild CAD 2013  . Palpitations   . Pneumonia 07/09/2015  . Shingles   . Sleep apnea    "never completed part 2 of study; never wore mask" (07/09/2015)  . Vitamin B 12 deficiency   . Vitamin D deficiency    Review of Systems:  ROS negative except as per HPI.  Physical Exam: Vitals:   09/09/18 1320  BP: 117/68  Pulse: 76  Temp: 99.1 F (37.3 C)  TempSrc: Oral  SpO2: 94%  Weight: 224 lb 14.4 oz (102 kg)  Height: 5\' 3"  (1.6 m)   General: A/O x4, in no acute distress, afebrile, nondiaphoretic, not using expiratory muscles of respiration. HEENT: PEERL, CNII-XII grossly intact, EMO intact Cardio: RRR, no mrg's Pulmonary: Mild rhonchi and expiratory wheezing in all lung fields  with poor/moderate air movement. RR ~18bbm MSK: BLE nontender, nonedematous  Assessment & Plan:   See Encounters Tab for problem based charting.  Patient discussed with Dr. Daryll Drown

## 2018-09-12 DIAGNOSIS — J189 Pneumonia, unspecified organism: Secondary | ICD-10-CM | POA: Diagnosis not present

## 2018-09-12 DIAGNOSIS — G4733 Obstructive sleep apnea (adult) (pediatric): Secondary | ICD-10-CM | POA: Diagnosis not present

## 2018-09-14 ENCOUNTER — Encounter (INDEPENDENT_AMBULATORY_CARE_PROVIDER_SITE_OTHER): Payer: Self-pay | Admitting: Dietician

## 2018-09-14 ENCOUNTER — Ambulatory Visit (INDEPENDENT_AMBULATORY_CARE_PROVIDER_SITE_OTHER): Payer: Medicare Other | Admitting: Psychology

## 2018-09-14 ENCOUNTER — Ambulatory Visit (INDEPENDENT_AMBULATORY_CARE_PROVIDER_SITE_OTHER): Payer: Medicare Other | Admitting: Dietician

## 2018-09-14 VITALS — Ht 63.0 in | Wt 223.0 lb

## 2018-09-14 DIAGNOSIS — Z6839 Body mass index (BMI) 39.0-39.9, adult: Secondary | ICD-10-CM

## 2018-09-14 DIAGNOSIS — Z9189 Other specified personal risk factors, not elsewhere classified: Secondary | ICD-10-CM

## 2018-09-14 DIAGNOSIS — R7303 Prediabetes: Secondary | ICD-10-CM

## 2018-09-14 DIAGNOSIS — F3289 Other specified depressive episodes: Secondary | ICD-10-CM | POA: Diagnosis not present

## 2018-09-15 NOTE — Progress Notes (Signed)
Office: [phone]  /  Fax: [phone]     Shannon [name] has a diagnosis of prediabetes based on her elevated HgA1c and was informed this puts her at greater risk of developing diabetes. She is here today for diabetic risk nutrition counseling which includes her obesity treatment plan.   Shannon [name]'s weight today is 223 lbs, a 6 lb weight loss since her last visit. She has had a weight loss of 4 lbs since beginning treatment with [place].   She reports today that she has been very sick and was put on steroids for her "worsening cough". Patient describes feeling like the only reason she has loss weight this week is because she has been sick. Per a diet recall for the past week Shannon [name] reports eating "2 hot dogs, a can of pork and beans with 2 tablespoons of honey and 1 tsp of butter because it was all I had and needed to eat with all the medications I take." She also stated "I am extra salt sensitive and blow up if I eat too much salt," however she appeared unaware of foods that may be high in sodium as evidenced by her diet recall and reported food choices.  Shannon [name] was hyper focused on "needing to eat because of all the medications I take"  and when her meal plan was reviewed she admitted she had "really not looked at it."   Significant redirection required to keep patient engaged in the nutrition counseling. This was evidenced by her being hyper focused and repeating through out her visit, "I have to eat because of all the medications I need to take."   Reviewed her Category 2 meal plan again with a focus on the nutrients ie incorporating lean proteins, vegetables and fruits for ongoing weight loss and reducing her risk of diabetes.   Shannon [name] is on the following meal plan: category 2  Her meal plan was individualized for maximum benefit.  Also discussed at length the following behavioral modifications to help maximize success: increasing lean protein intake, decreasing simple carbohydrates, increasing  vegetables, decreasing sodium intake, avoiding skipping meals, meal planning and cooking strategies, keeping healthy foods in the home.     Shannon [name] has been instructed to work up to a goal of 150 minutes of combined cardio and strengthening exercise per week for weight loss and overall health benefits. Written information was provided and the following handouts were given: category 2 meal plan.    Office: [phone]  /  Fax: [phone]  OBESITY BEHAVIORAL INTERVENTION VISIT  Today's visit was # 4   Starting weight: 227 lbs Starting date: 06/30/18 Today's weight : Weight: 223 lb (101.2 kg)  Today's date: 09/14/18 Total lbs lost to date: 4 lbs At least 15 minutes were spent on discussing the following behavioral intervention visit.   ASK: We discussed the diagnosis of obesity with Shannon [name] today and Shannon [name] agreed to give [place] permission to discuss obesity behavioral modification therapy today.  ASSESS: Shannon [name] has the diagnosis of obesity and her BMI today is 39.5 Shannon [name] is in the action stage of change   ADVISE: Shannon [name] was educated on the multiple health risks of obesity as well as the benefit of weight loss to improve her health. She was advised of the need for long term treatment and the importance of lifestyle modifications to improve her current health and to decrease her risk of future health problems.  AGREE: Multiple dietary modification options and treatment options were discussed and  Shannon [name] agreed to follow the  recommendations documented in the above note.  ARRANGE: Shannon [name] was educated on the importance of frequent visits to treat obesity as outlined per CMS and USPSTF guidelines and agreed to schedule her next follow up appointment today.

## 2018-09-17 NOTE — Progress Notes (Signed)
Internal Medicine Clinic Attending  Case discussed with Dr. Harbrecht at the time of the visit.  We reviewed the resident's history and exam and pertinent patient test results.  I agree with the assessment, diagnosis, and plan of care documented in the resident's note.   

## 2018-09-19 ENCOUNTER — Encounter: Payer: Self-pay | Admitting: Cardiovascular Disease

## 2018-09-19 ENCOUNTER — Ambulatory Visit (INDEPENDENT_AMBULATORY_CARE_PROVIDER_SITE_OTHER): Payer: Medicare Other | Admitting: Cardiovascular Disease

## 2018-09-19 ENCOUNTER — Encounter

## 2018-09-19 ENCOUNTER — Ambulatory Visit: Payer: Medicare Other | Admitting: Cardiovascular Disease

## 2018-09-19 VITALS — BP 134/90 | HR 91 | Ht 63.0 in | Wt 228.0 lb

## 2018-09-19 DIAGNOSIS — I10 Essential (primary) hypertension: Secondary | ICD-10-CM | POA: Diagnosis not present

## 2018-09-19 DIAGNOSIS — E7800 Pure hypercholesterolemia, unspecified: Secondary | ICD-10-CM

## 2018-09-19 DIAGNOSIS — I712 Thoracic aortic aneurysm, without rupture: Secondary | ICD-10-CM | POA: Diagnosis not present

## 2018-09-19 DIAGNOSIS — I7121 Aneurysm of the ascending aorta, without rupture: Secondary | ICD-10-CM

## 2018-09-19 DIAGNOSIS — Z7901 Long term (current) use of anticoagulants: Secondary | ICD-10-CM | POA: Diagnosis not present

## 2018-09-19 DIAGNOSIS — R0602 Shortness of breath: Secondary | ICD-10-CM | POA: Diagnosis not present

## 2018-09-19 NOTE — Patient Instructions (Signed)
Medication Instructions:  Your physician recommends that you continue on your current medications as directed. Please refer to the Current Medication list given to you today.  If you need a refill on your cardiac medications before your next appointment, please call your pharmacy.   Lab work: none  Testing/Procedures: none  Follow-Up: At [facility], you and your health needs are our priority.  As part of our continuing mission to provide you with exceptional heart care, we have created designated Provider Care Teams.  These Care Teams include your primary Cardiologist (physician) and Advanced Practice Providers (APPs -  Physician Assistants and Nurse Practitioners) who all work together to provide you with the care you need, when you need it. You will need a follow up appointment in 3 months. You may see Skeet Latch, MD or one of the following Advanced Practice Providers on your designated Care Team:   [name], PA-C Roby Lofts, [place] . Sande Rives, PA-C  Any Other Special Instructions Will Be Listed Below (If Applicable). Work on limiting your fried and fatty food intake

## 2018-09-19 NOTE — Progress Notes (Signed)
Cardiology Office Note   Date:  09/19/2018   ID:  Shannon [name], DOB May 22, 1956, MRN [id number]  PCP:  [name], MD  Cardiologist:   Skeet Latch, MD  ID: [name], MD  No chief complaint on file.    History of Present Illness: Shannon [name] is a 63 y.o. female with hypertension, asthma, non-obstructive CAD, OSA, prior PE, moderate pulmonary hypertension, mild ascending aorta aneurysm, and HIV (well-controlled)  who presents for follow up.  Shannon [name] was initially referred by her infectious disease doctor, [name], MD, on 12/26/15. [name] was concerned about her lower extremity edema and shortness of breath.  She was started on lasix 20 mg every other day.  Shannon [name] noted shortness of breath since her hospitalization for pneumonia 06/2015.   She denied lower extremity edema or orthopnea but did report chest pain when laying down at night.  She was referred for an echo 01/2016 that revealed LVEF 60-65% awith grade 1 diastolic dysfunction and hypokinesis of the basal inferior wall.  She also had very mild aortic stenosis with a mean gradient of 9 mmHg and trivial AR.  PASP was 52 mmHg.  She had a Lexiscan Myoview at that time that was negative for ischemia.  Shannon [name] previously had a Lexiscan Cardiolite in September 2013 that revealed a medium, mild reversible defect in the anterior wall and normal systolic function. She subsequently underwent cardiac catheterization that revealed nonobstructive coronary disease. She had mild pulmonary hypertension and a normal cardiac output. Left ventriculography revealed mild to moderate aortic regurgitation. She had an echo May 2016 that revealed an ejection fraction of 20% and mild diastolic dysfunction. It also revealed calcification of the aortic valve, mild to moderate aortic regurgitation, and mild aortic stenosis. Peak velocity was 2.1 m/s. She had a 24-hour Holter in February 2015 that revealed rare PACs and PVCs.  Echo 01/06/17  revealed LVEF 60-65% with mild LVH and a mean aortic valve gradient of 8 mmHg.  Tricuspid regurgitation was not significant enough to assess pulmonary pressures  Shannon [name] was admitted to the hospital 01/25/17 with hypertensive emergency and chest pain. Her blood pressure was 215/100.  Her blood pressure in the ED was 185/119. She was again seen in the ED 01/28/17 with shortness of breath and wheezing. Her blood pressure at that time was 190/120. It was noted that she hasn't been taking her medication as prescribed.  HCTZ was discontinued.     Shannon [name] has been doing much better with her blood pressure.  She has stopped taking losartan because her blood pressure gets too low and she feels poorly.  She notes that her blood pressure is elevated when she is in pain.  She has a lot of pain from her neck and back.  She was also in the ED 08/25/17 for a URI and on 08/18/17 for R sided chest and neck pain.  Her BP was 172/97 at the time. She continues to also complain of her ganglion cyst, poor healing from an anterior tibia wound, lipodystrophy and skepticism about whether she should change her antiretrovirals as directed by her infectious disease team.  Due to the poor healing of her leg wound she decided to stop taking her anticoagulation.  She also thinks that this is causing her to feel cold.  She struggles with nausea and is not eating well.  She sees her gastroenterologist next week.  She has numbness in the left side of her face and  neck and is following up with urology for this.  She has been very unstable on her feet which she attributes to central adiposity.  She wonders if she could be a candidate for gastric bypass.  She has been referred to see Dr. Leafy Ro.  Shannon [name] saw Shannon Ransom, PA-C on 12/2017 and her BP was poorly controlled.  She wasn't taking lasix due to fear it would dehydrate her.  Shannon [name] suggested switching to a different ARB or starting a daily diuretic but she declined.  At her last appointment Ms.  [name]'s BP was elevated.  She was not taking losartan 2/2 recall so she was started on lisinopril. Since then she was admitted for an asthma exacerbation 03/2018.  At that time her BP was poorly controlled.  Carvedilol and nebivolol were stopped and her BP was controlled.  Since then she followed up with her PCP 04/26/18 for headache and her BP was 193/112.  She had continued to take carvedilol and this was discontinued at her clinic.  However they restarted her nifedipine.   Since her last appointment Shannon [name] started back in school.  She has struggled with an upper respiratory infection for the last three weeks.  Her PCP ordered a chest MRA to evaluate her aortic aneurysm.  She started seeing Dr. Leafy Ro and has really appreciated the knowledge and guidance.  She is working on her diet and didn't realize that foods like potatoes and rice turned into sugar.  Her BP has been much better controlled lately.  She saw Shannon Ferries, PA-C on 1/3 and after a call to her pharmacy it was noted that she has not taking diltiazem.  She is taking lisionpril and nifedipine regularly.  Her BP has been very well-controlled and she attributes it to changing her diet.  She has no LE edema, orthopnea or PND.  Her main complaint is persistent congestion and needing an antibiotics.     Past Medical History:  Diagnosis Date  . Anemia   . Anxiety    HX PANIC ATTACKS  . Arthritis    "starting to; in my hands" (07/09/2015)  . Asthma   . Atrial fibrillation ([place])   . Atrial flutter, paroxysmal ([place])   . Bloated abdomen   . CFS (chronic fatigue syndrome)   . Chewing difficulty   . Chronic asthma with acute exacerbation    "I have chronic asthma all the time; sometimes exacerbations" (07/09/2015)  . Chronic lower back pain   . COPD (chronic obstructive pulmonary disease) ([place])   . Cyst of right kidney    "3 of them; dx'd in ~ 01/2015"  . Dyspnea   . GERD (gastroesophageal reflux disease)   . Heart murmur   . History of  blood transfusion    "related to my brain surgery I think"  . History of pulmonary embolism 07/09/2015  . HIV antibody positive ([place])   . HIV disease ([place])   . Hyperlipidemia   . Hypertension   . Leg edema   . Lipodystrophy   . Mild CAD 2013  . Palpitations   . Pneumonia 07/09/2015  . Shingles   . Sleep apnea    "never completed part 2 of study; never wore mask" (07/09/2015)  . Vitamin B 12 deficiency   . Vitamin D deficiency     Past Surgical History:  Procedure Laterality Date  . ABDOMINAL HYSTERECTOMY     "robotic laparosopic"  . BRAIN SURGERY  1974   "brain tumor; benign; on top of my  brain; got a plate in there"  . CARDIAC CATHETERIZATION    . TONSILLECTOMY AND ADENOIDECTOMY       Current Outpatient Medications  Medication Sig Dispense Refill  . acetaminophen (TYLENOL) 325 MG tablet Take 325 mg by mouth every 6 (six) hours as needed (FOR PAIN).    [name] albuterol (PROAIR HFA) 108 (90 Base) MCG/ACT inhaler INHALE 2 PUFFS INTO THE LUNGS EVERY 4 HOURS AS NEEDED FOR WHEEZING OR SHORTNESS OF BREATH 8.5 g 6  . albuterol (PROVENTIL) (2.5 MG/3ML) 0.083% nebulizer solution USE 1 VIAL VIA NEBULIZER EVERY 6 HOURS AS NEEDED FOR WHEEZING OR SHORTNESS OF BREATH 75 mL 6  . Ascorbic Acid (VITAMIN C) 1000 MG tablet Take 1,000 mg by mouth 2 (two) times a week.     [name] azelastine (OPTIVAR) 0.05 % ophthalmic solution Place 2 drops into both eyes 2 (two) times daily as needed (for swelling or itching).   3  . bictegravir-emtricitabine-tenofovir AF (BIKTARVY) 50-200-25 MG TABS tablet Take 1 tablet by mouth daily. 30 tablet 11  . BIOTIN PO Take 1 tablet by mouth daily as needed (for supplementation).     . Cholecalciferol (SM VITAMIN D3) 100 MCG (4000 UT) CAPS Take 1 capsule (4,000 Units total) by mouth daily. 30 capsule 0  . cloNIDine (CATAPRES) 0.1 MG tablet TAKE 1 TABLET TWICE A DAY AS NEEDED FOR SYSTOLIC BLOOD PRESSURE (TOP NUMBER) GREATER THAN 160 60 tablet 5  . cyanocobalamin (,VITAMIN B-12,)  1000 MCG/ML injection ADMINISTER 1 ML(1000 MCG) IN THE MUSCLE EVERY 30 DAYS 1 mL 0  . diltiazem (CARDIZEM CD) 240 MG 24 hr capsule Take 1 capsule (240 mg total) by mouth daily. 90 capsule 3  . fluticasone furoate-vilanterol (BREO ELLIPTA) 100-25 MCG/INH AEPB Inhale 1 puff into the lungs daily. Do not take if also taking Trelegy 28 each 1  . furosemide (LASIX) 40 MG tablet Take 1 tablet (40 mg total) by mouth daily. 90 tablet 3  . lisinopril (PRINIVIL,ZESTRIL) 40 MG tablet Take 1 tablet (40 mg total) by mouth daily. 90 tablet 3  . loratadine (CLARITIN) 10 MG tablet Take 1 tablet (10 mg total) by mouth daily. 30 tablet 2  . LORazepam (ATIVAN) 0.5 MG tablet Take 1 tablet (0.5 mg total) by mouth 3 (three) times daily as needed for anxiety. 30 tablet 0  . Melatonin 5 MG TABS Take 5 mg by mouth at bedtime as needed (for sleep).    . Mouthwashes (BIOTENE DRY MOUTH GENTLE) LIQD Use as directed 1 Dose in the mouth or throat 2 (two) times daily. 1 Bottle 0  . NIFEdipine (ADALAT CC) 90 MG 24 hr tablet TAKE 1 TABLET BY MOUTH DAILY 30 tablet 0  . omeprazole (PRILOSEC) 40 MG capsule Take 1 capsule (40 mg total) by mouth daily. 90 capsule 3  . OXYGEN Inhale 1.5-2 L into the lungs continuous.     . polyethylene glycol (MIRALAX / GLYCOLAX) packet Take 17 g by mouth daily. 14 each 0  . potassium chloride (K-DUR) 10 MEQ tablet TAKE 1 TABLET(10 MEQ) BY MOUTH EVERY OTHER DAY 45 tablet 2  . potassium chloride SA (K-DUR,KLOR-CON) 20 MEQ tablet Take 1 tablet (20 mEq total) by mouth daily. 90 tablet 3  . PREVIDENT 5000 SENSITIVE 1.1-5 % PSTE Use daily as directed  1  . senna-docusate (SENOKOT-S) 8.6-50 MG tablet Take 1 tablet by mouth at bedtime as needed for mild constipation. 30 tablet 0  . sodium chloride (OCEAN) 0.65 % SOLN nasal spray Place 1 spray  into both nostrils as needed for congestion. 88 mL 0  . Tetrahydrozoline HCl (VISINE OP) Place 2 drops into both eyes daily as needed (for dry eyes).     Alveda Reasons 20 MG  TABS tablet TAKE 1 TABLET(20 MG) BY MOUTH DAILY WITH SUPPER 30 tablet 5   No current facility-administered medications for this visit.     Allergies:   Tree extract and Ciprofloxacin    Social History:  The patient  reports that she has never smoked. She has never used smokeless tobacco. She reports current alcohol use. She reports that she does not use drugs.   Family History:  The patient's family history includes Asthma in her mother; Diabetes in her sister; Heart disease in her mother; Heart failure in her mother; Heart murmur in her brother, sister, and sister; Obesity in her mother; Thyroid disease in her mother and sister.    ROS:  Please see the history of present illness.   Otherwise, review of systems are positive for none   All other systems are reviewed and negative.   PHYSICAL EXAM: VS:  BP 134/90   Pulse 91   Ht 5\' 3"  (1.6 m)   Wt 228 lb (103.4 kg)   SpO2 93%   BMI 40.39 kg/m  , BMI Body mass index is 40.39 kg/m. GENERAL:  Well appearing HEENT: Pupils equal round and reactive, fundi not visualized, oral mucosa unremarkable NECK:  No jugular venous distention, waveform within normal limits, carotid upstroke brisk and symmetric, no bruits LUNGS:  Poor air movement HEART:  RRR.  PMI not displaced or sustained,S1 and S2 within normal limits, no S3, no S4, no clicks, no rubs, no murmurs ABD:  Flat, positive bowel sounds normal in frequency in pitch, no bruits, no rebound, no guarding, no midline pulsatile mass, no hepatomegaly, no splenomegaly EXT:  2 plus pulses throughout, no edema, no cyanosis no clubbing SKIN:  No rashes no nodules NEURO:  Cranial nerves II through XII grossly intact, motor grossly intact throughout PSYCH:  Cognitively intact, oriented to person place and time   EKG:  EKG is not ordered today. The ekg ordered 12/31/15 shows sinus rhythm. Rate 89 bpm. 2 PVCs. 10/28/16: Sinus rhythm. Rate 93 bpm 07/01/17: Sinus tachycardia.  Rate 115 bpm.  LAD.     Coronary angiography 06/01/12: 10-20% OM, diffuse 20-30% LAD, 10-20% D1, 20% diffuse RCA RA 10, RV 39/12, RV EDP 12, capillary wedge pressure 19, PA 39/21, mean 27, LV 116/14, LVEDP 20, aorta 115/77 Cardiac output (Fick) 6.55, cardiac index 3.2 PVR 1.7 with units, SVR 12 with units. She P/QRS ratio 1 LVEF 80%. Aortic root mildly dilated. 1-2+ aortic regurgitation.  24-hour Holter 09/26/13: Sinus rhythm, rare PACs, rare PVCs  Echo 01/22/15: LVEF 70%. Mild LVH. Mild diastolic dysfunction. Normal RV function. Left atrium moderately enlarged. Right atrium mildly enlarged. Trace mitral regurgitation. Calcific aortic valve. Mild to moderate aortic regurgitation. Mild aortic stenosis. Peak velocity 2.1 m/s trace tricuspid regurgitation    Lexiscan Myoview 01/2016:   The left ventricular ejection fraction is normal (55-65%). The EF is 60% visually. The computer generated EF is not calculated correctly and is therefore not reported.  There was no ST segment deviation noted during stress.  The study is normal. no ischemia . no infarction   Echo 01/29/16: Study Conclusions  - Left ventricle: The cavity size was normal. Wall thickness was   increased in a pattern of moderate LVH. Systolic function was   normal. The estimated ejection fraction  was in the range of 60%   to 65%. Basal inferior hypokinesis. Doppler parameters are   consistent with abnormal left ventricular relaxation (grade 1   diastolic dysfunction). - Aortic valve: Trileaflet; moderately calcified leaflets. There   was very mild stenosis. There was mild regurgitation. Mean   gradient (S): 9 mm Hg. Valve area (VTI): 1.68 cm^2. - Mitral valve: There was trivial regurgitation. - Left atrium: The atrium was mildly dilated. - Right ventricle: The cavity size was normal. Systolic function   was normal. - Tricuspid valve: Peak RV-RA gradient (S): 44 mm Hg. - Pulmonary arteries: PA peak pressure: 52 mm Hg (S). - Systemic veins: IVC  measured 2.3 cm with > 50% respirophasic   variation, suggesting RA pressure 8 mmHg.  Impressions:  - Normal LV size with moderate LV hypertrophy. EF 60-65%. Basal   inferior hypokinesis. Normal RV size and systolic function. Very   mild aortic stenosis, mild aortic insufficieny. Moderate   pulmonary hypertension.   Recent Labs: 03/23/2018: B Natriuretic Peptide 106.5 06/29/2018: Hemoglobin 12.5; Platelets 203 06/30/2018: ALT 20; TSH 0.411 08/26/2018: BUN 18; Creatinine, Ser 1.18; Potassium 4.3; Sodium 144    Lipid Panel    Component Value Date/Time   CHOL 253 (H) 05/03/2018 1043   TRIG 53 05/03/2018 1043   HDL 86 05/03/2018 1043   CHOLHDL 2.9 05/03/2018 1043   CHOLHDL 3.2 11/26/2016 1618   VLDL 18 11/26/2016 1618   LDLCALC 156 (H) 05/03/2018 1043      Wt Readings from Last 3 Encounters:  09/19/18 228 lb (103.4 kg)  09/14/18 223 lb (101.2 kg)  09/09/18 224 lb 14.4 oz (102 kg)      ASSESSMENT AND PLAN:  # Atrial flutter: Converted with flecainide.  Continue diltiazem and Xarelto.   # Hypertension:  BP is much better.  She is taking her medication consistently and working on her diet.  Continue nifedipine and lisinopril.  Clonidine PRN SBP > 160.   # Aortic regurgitation # Aortic stenosis: Ms. Weins has very mild aortic stenosis (mean gradient 9 mmHg) and mild aortic regurgitation.    # Hyperlipidemia: Lipids are elevated.  She is working on diet and would like to continue this before starting a statin.  We will check lipids again in 3 months and reassess.  # Ascending aorta aneurysm: 4.0 on CT 12/2016.  Appreciate her PCP ordering an MRA to follow up.  Time spent: 35 minutes-Greater than 50% of this time was spent in counseling, explanation of diagnosis, planning of further management, and coordination of care.   Current medicines are reviewed at length with the patient today.  The patient does not have concerns regarding medicines.  The following changes have been  made:  None   Labs/ tests ordered today include:   No orders of the defined types were placed in this encounter.    Disposition:   FU with Shannon Garlington C. Oval Linsey, MD, [facility] in 3 months    Signed, Shannon Bronaugh C. Oval Linsey, MD, [facility]  09/19/2018 5:11 PM    [place]

## 2018-09-20 ENCOUNTER — Other Ambulatory Visit: Payer: Self-pay | Admitting: Internal Medicine

## 2018-09-20 ENCOUNTER — Ambulatory Visit: Payer: Medicare Other | Admitting: Adult Health

## 2018-09-20 DIAGNOSIS — K047 Periapical abscess without sinus: Secondary | ICD-10-CM

## 2018-09-20 NOTE — Telephone Encounter (Signed)
Requesting antibiotic to be filled. Please call pt back.

## 2018-09-21 ENCOUNTER — Telehealth: Payer: Self-pay | Admitting: Dietician

## 2018-09-21 ENCOUNTER — Ambulatory Visit (HOSPITAL_COMMUNITY): Admission: RE | Admit: 2018-09-21 | Payer: Medicare Other | Source: Ambulatory Visit

## 2018-09-21 ENCOUNTER — Telehealth: Payer: Self-pay

## 2018-09-21 NOTE — Telephone Encounter (Signed)
opened in error

## 2018-09-21 NOTE — Telephone Encounter (Signed)
Called pt, states she is no better since last visit and abx, wants refill abx, informed must see pt for re eval, appt 1/30 at [address]

## 2018-09-21 NOTE — Telephone Encounter (Signed)
Requesting amoxicillin to be filled @  [facility] [place], [place] [phone] (Phone) [phone] (Fax)

## 2018-09-22 ENCOUNTER — Ambulatory Visit: Payer: Medicare Other

## 2018-09-22 ENCOUNTER — Encounter: Payer: Self-pay | Admitting: Internal Medicine

## 2018-09-22 NOTE — Assessment & Plan Note (Signed)
  COPD/Asthma exacerbation: The patient endorses a 1.5 week Hx of shortness of breath, increased nonpurulent sputum and cough with subsequent chest discomfort. Denied hemoptysis, acute chest pain, visual changes, headache, myalgias. Endorses a fever of 100F orally. Has not been taking her Breo due to the concern for Thrush with the ICS. Has been taking the PRN albuterol. Poor support at home. Tired physically from the illness. The patient is able to speak in full sentences and only occassionally pauses to take a breath. She appears comfortable on 1.5L of O2 (her home dose).  Mild to moderate exacerbation absent life threatening features. I would like to have the peak flow value but she does not know her baseline nor does she have a meter with her.  With her comorbid conditions, I feel treatment for is indicated.  Plan: Albuterol nebulizer  IM Solumedrol 125mg  CXR STAT was unremarkable Following treatment the patient stated that she felt much improved and as such I feel that she is a likely candidate for home treatment with 5-7 days of steroids, home albuterol nebulizer treatment, continued Breo therapy The patient was given strict return precautions. There was improved air movement following treatment.

## 2018-09-22 NOTE — Telephone Encounter (Signed)
Attempted to call [name].  Left message, no answer.  Will not refill unless discussed with patient.

## 2018-09-23 DIAGNOSIS — H524 Presbyopia: Secondary | ICD-10-CM | POA: Diagnosis not present

## 2018-09-26 ENCOUNTER — Other Ambulatory Visit: Payer: Self-pay | Admitting: Internal Medicine

## 2018-09-28 ENCOUNTER — Other Ambulatory Visit: Payer: Self-pay | Admitting: *Deleted

## 2018-09-28 ENCOUNTER — Ambulatory Visit (INDEPENDENT_AMBULATORY_CARE_PROVIDER_SITE_OTHER): Payer: Medicare Other | Admitting: Family Medicine

## 2018-09-28 ENCOUNTER — Ambulatory Visit: Payer: Medicare Other | Admitting: Internal Medicine

## 2018-09-28 ENCOUNTER — Encounter (INDEPENDENT_AMBULATORY_CARE_PROVIDER_SITE_OTHER): Payer: Self-pay

## 2018-09-28 MED ORDER — NIFEDIPINE ER 90 MG PO TB24: 90.0000 mg | ORAL_TABLET | Freq: Every day | ORAL | 3 refills | Status: DC

## 2018-09-30 ENCOUNTER — Other Ambulatory Visit: Payer: Self-pay | Admitting: Internal Medicine

## 2018-09-30 ENCOUNTER — Ambulatory Visit (INDEPENDENT_AMBULATORY_CARE_PROVIDER_SITE_OTHER): Payer: Medicare Other | Admitting: Adult Health

## 2018-09-30 ENCOUNTER — Encounter: Payer: Self-pay | Admitting: Adult Health

## 2018-09-30 VITALS — BP 124/72 | HR 95 | Temp 98.5°F | Ht 63.0 in | Wt 230.0 lb

## 2018-09-30 DIAGNOSIS — J9611 Chronic respiratory failure with hypoxia: Secondary | ICD-10-CM

## 2018-09-30 DIAGNOSIS — J4541 Moderate persistent asthma with (acute) exacerbation: Secondary | ICD-10-CM

## 2018-09-30 MED ORDER — LEVALBUTEROL HCL 0.63 MG/3ML IN NEBU
0.6300 mg | INHALATION_SOLUTION | Freq: Once | RESPIRATORY_TRACT | Status: AC
  Administered 2018-09-30: 0.63 mg via RESPIRATORY_TRACT

## 2018-09-30 MED ORDER — FLUTICASONE FUROATE-VILANTEROL 200-25 MCG/INH IN AEPB: 1.0000 | INHALATION_SPRAY | Freq: Every day | RESPIRATORY_TRACT | 0 refills | Status: DC

## 2018-09-30 MED ORDER — DOXYCYCLINE HYCLATE 100 MG PO TABS: 100.0000 mg | ORAL_TABLET | Freq: Two times a day (BID) | ORAL | 0 refills | Status: DC

## 2018-09-30 MED ORDER — ALBUTEROL SULFATE HFA 108 (90 BASE) MCG/ACT IN AERS: INHALATION_SPRAY | RESPIRATORY_TRACT | 6 refills | Status: DC

## 2018-09-30 NOTE — Assessment & Plan Note (Signed)
Slow to resolve flare  Check sputum cx , AFB , fungal - immunosuppressed pt .  ? ACE may be aggravating cough and wheezing  Control for cough   Plan  Patient Instructions  Sputum culture today .  Doxycycline 100mg  Twice daily  For 1 week , - to have on hold if symptoms worsen with discolored mucus .  Discuss with Cardiology that Lisinopril may be aggravating your cough .  Mucinex Twice daily  As needed  Cough/congestion  Delsym 2 tsp Twice daily  As needed  Cough  Increase BREO 200 until same is complete then resume BREO 100 daily , rinse after use.  Albuterol Inhaler /  Neb every 4-6 hr as needed wheezing .  Follow up with Dr. Elsworth Soho  In 4-6 weeks or Caylor Tallarico NP .  Please contact office for sooner follow up if symptoms do not improve or worsen or seek emergency care

## 2018-09-30 NOTE — Progress Notes (Signed)
@Patient  ID: Shannon [name], female    DOB: October 23, 1955, 63 y.o.   MRN: [id number]  Chief Complaint  Patient presents with  . Acute Visit    Cough     Referring provider: [name], MD  HPI: 63 year old female never smoker followed for severe persistent asthma, nocturnal hypoxemia on oxygen 2 L at bedtime, allergic rhinitis Medical history significant for HIV followed at the ID clinic, lipodystrophy, A- flutter on Xarelto  TEST/EVENTS :  CTa chest 12/2016 , 01/2017 >neg for PE , multifocal scarring bilaterally PFT (12/04/15) FEV1/FVC 50%, FEV1 0.64 31%.+++BD response. DLCO 66%. Spirometry 09/2017-ratio 59, FEV1 24%, FVC 32% Spirometry 05/2018 ratio 55, FEV1 28%, FVC 38%   Sleep study in 04/2016 was (-) for OSA  09/30/2018 Acute OV : Asthma  Patient presents for an acute office visit.  She complains of 3-4  weeks of increased cough congestion with thick green mucus intermittent wheezing and shortness of breath.  She was initially seen by her primary care physician and given Amoxicillin and prednisone taper.  She did have some improvement in symptoms however continues to have persistent cough and wheezing. Chest x-ray done on January 17 showed no acute process. She remains on Breo daily. Taking mucinex which helps some . Still has lingering cough .   She is on an ACE inhibitor.  Allergies  Allergen Reactions  . Tree Extract Swelling and Other (See Comments)    Swelling to eyes  . Ciprofloxacin Hives    There is no immunization history for the selected administration types on file for this patient.  Past Medical History:  Diagnosis Date  . Anemia   . Anxiety    HX PANIC ATTACKS  . Arthritis    "starting to; in my hands" (07/09/2015)  . Asthma   . Atrial fibrillation ([place])   . Atrial flutter, paroxysmal ([place])   . Bloated abdomen   . CFS (chronic fatigue syndrome)   . Chewing difficulty   . Chronic asthma with acute exacerbation    "I have chronic asthma all  the time; sometimes exacerbations" (07/09/2015)  . Chronic lower back pain   . COPD (chronic obstructive pulmonary disease) ([place])   . Cyst of right kidney    "3 of them; dx'd in ~ 01/2015"  . Dyspnea   . GERD (gastroesophageal reflux disease)   . Heart murmur   . History of blood transfusion    "related to my brain surgery I think"  . History of pulmonary embolism 07/09/2015  . HIV antibody positive ([place])   . HIV disease ([place])   . Hyperlipidemia   . Hypertension   . Leg edema   . Lipodystrophy   . Mild CAD 2013  . Palpitations   . Pneumonia 07/09/2015  . Shingles   . Sleep apnea    "never completed part 2 of study; never wore mask" (07/09/2015)  . Vitamin B 12 deficiency   . Vitamin D deficiency     Tobacco History: Social History   Tobacco Use  Smoking Status Never Smoker  Smokeless Tobacco Never Used   Counseling given: Not Answered   Outpatient Medications Prior to Visit  Medication Sig Dispense Refill  . acetaminophen (TYLENOL) 325 MG tablet Take 325 mg by mouth every 6 (six) hours as needed (FOR PAIN).    [name] albuterol (PROVENTIL) (2.5 MG/3ML) 0.083% nebulizer solution USE 1 VIAL VIA NEBULIZER EVERY 6 HOURS AS NEEDED FOR WHEEZING OR SHORTNESS OF BREATH 75 mL 6  . Ascorbic  Acid (VITAMIN C) 1000 MG tablet Take 1,000 mg by mouth 2 (two) times a week.     [name] azelastine (OPTIVAR) 0.05 % ophthalmic solution Place 2 drops into both eyes 2 (two) times daily as needed (for swelling or itching).   3  . BIOTIN PO Take 1 tablet by mouth daily as needed (for supplementation).     . Cholecalciferol (SM VITAMIN D3) 100 MCG (4000 UT) CAPS Take 1 capsule (4,000 Units total) by mouth daily. 30 capsule 0  . cloNIDine (CATAPRES) 0.1 MG tablet TAKE 1 TABLET TWICE A DAY AS NEEDED FOR SYSTOLIC BLOOD PRESSURE (TOP NUMBER) GREATER THAN 160 60 tablet 5  . cyanocobalamin (,VITAMIN B-12,) 1000 MCG/ML injection ADMINISTER 1 ML(1000 MCG) IN THE MUSCLE EVERY 30 DAYS 1 mL 0  . diltiazem (CARDIZEM CD)  240 MG 24 hr capsule Take 1 capsule (240 mg total) by mouth daily. 90 capsule 3  . fluticasone furoate-vilanterol (BREO ELLIPTA) 100-25 MCG/INH AEPB Inhale 1 puff into the lungs daily. Do not take if also taking Trelegy 28 each 1  . furosemide (LASIX) 40 MG tablet Take 1 tablet (40 mg total) by mouth daily. 90 tablet 3  . loratadine (CLARITIN) 10 MG tablet Take 1 tablet (10 mg total) by mouth daily. 30 tablet 2  . LORazepam (ATIVAN) 0.5 MG tablet Take 1 tablet (0.5 mg total) by mouth 3 (three) times daily as needed for anxiety. 30 tablet 0  . Melatonin 5 MG TABS Take 5 mg by mouth at bedtime as needed (for sleep).    . Mouthwashes (BIOTENE DRY MOUTH GENTLE) LIQD Use as directed 1 Dose in the mouth or throat 2 (two) times daily. 1 Bottle 0  . NIFEdipine (ADALAT CC) 90 MG 24 hr tablet Take 1 tablet (90 mg total) by mouth daily. 30 tablet 3  . omeprazole (PRILOSEC) 40 MG capsule Take 1 capsule (40 mg total) by mouth daily. 90 capsule 3  . OXYGEN Inhale 1.5-2 L into the lungs continuous.     . polyethylene glycol (MIRALAX / GLYCOLAX) packet Take 17 g by mouth daily. 14 each 0  . potassium chloride (K-DUR) 10 MEQ tablet TAKE 1 TABLET(10 MEQ) BY MOUTH EVERY OTHER DAY 45 tablet 2  . potassium chloride SA (K-DUR,KLOR-CON) 20 MEQ tablet Take 1 tablet (20 mEq total) by mouth daily. 90 tablet 3  . PREVIDENT 5000 SENSITIVE 1.1-5 % PSTE Use daily as directed  1  . senna-docusate (SENOKOT-S) 8.6-50 MG tablet Take 1 tablet by mouth at bedtime as needed for mild constipation. 30 tablet 0  . sodium chloride (OCEAN) 0.65 % SOLN nasal spray Place 1 spray into both nostrils as needed for congestion. 88 mL 0  . Tetrahydrozoline HCl (VISINE OP) Place 2 drops into both eyes daily as needed (for dry eyes).     Alveda Reasons 20 MG TABS tablet TAKE 1 TABLET(20 MG) BY MOUTH DAILY WITH SUPPER 30 tablet 5  . albuterol (PROAIR HFA) 108 (90 Base) MCG/ACT inhaler INHALE 2 PUFFS INTO THE LUNGS EVERY 4 HOURS AS NEEDED FOR WHEEZING OR  SHORTNESS OF BREATH 8.5 g 6  . BIKTARVY 50-200-25 MG TABS tablet TAKE 1 TABLET BY MOUTH DAILY 30 tablet 0  . lisinopril (PRINIVIL,ZESTRIL) 40 MG tablet Take 1 tablet (40 mg total) by mouth daily. 90 tablet 3   No facility-administered medications prior to visit.      Review of Systems:   Constitutional:   No  weight loss, night sweats,  Fevers, chills, fatigue, or  lassitude.  HEENT:   No headaches,  Difficulty swallowing,  Tooth/dental problems, or  Sore throat,                No sneezing, itching, ear ache,  +nasal congestion, post nasal drip,   CV:  No chest pain,  Orthopnea, PND, swelling in lower extremities, anasarca, dizziness, palpitations, syncope.   GI  No heartburn, indigestion, abdominal pain, nausea, vomiting, diarrhea, change in bowel habits, loss of appetite, bloody stools.   Resp: No chest wall deformity  Skin: no rash or lesions.  GU: no dysuria, change in color of urine, no urgency or frequency.  No flank pain, no hematuria   MS:  No joint pain or swelling.  No decreased range of motion.  No back pain.    Physical Exam  BP 124/72 (BP Location: Left Arm, Patient Position: Sitting, Cuff Size: Large)   Pulse 95   Temp 98.5 F (36.9 C) (Oral)   Ht 5\' 3"  (1.6 m)   Wt 230 lb (104.3 kg)   SpO2 93%   BMI 40.74 kg/m   GEN: A/Ox3; pleasant , NAD, obese    HEENT:  [place]/AT,  EACs-clear, TMs-wnl, NOSE-clear drainage , THROAT-clear, no lesions, no postnasal drip or exudate noted.   NECK:  Supple w/ fair ROM; no JVD; normal carotid impulses w/o bruits; no thyromegaly or nodules palpated; no lymphadenopathy.    RESP  Few trace exp wheezing , speaks in full sentences  no accessory muscle use, no dullness to percussion  CARD:  RRR, no m/r/g, no peripheral edema, pulses intact, no cyanosis or clubbing.  GI:   Soft & nt; nml bowel sounds; no organomegaly or masses detected.   Musco: Warm bil, no deformities or joint swelling noted.   Neuro: alert, no focal deficits  noted.    Skin: Warm, no lesions or rashes    Lab Results:  CBC  ProBNP No results found for: PROBNP  Imaging: Dg Chest 2 View  Result Date: 09/09/2018 CLINICAL DATA:  Productive cough, hypoxia and dyspnea for 1.5 weeks. EXAM: CHEST - 2 VIEW COMPARISON:  PA and lateral chest 06/29/2018 05/20/2017. CT chest 01/05/2017. FINDINGS: Discoid atelectasis is seen in the lingula. No consolidative process, pneumothorax or effusion. Heart size is mildly enlarged. No acute or focal bony abnormality. IMPRESSION: No acute disease. Mild cardiomegaly. Electronically Signed   By: Inge Rise M.D.   On: 09/09/2018 15:02    albuterol (PROVENTIL) (2.5 MG/3ML) 0.083% nebulizer solution 2.5 mg    Date Action Dose Route User   09/09/2018 1335 Given 2.5 mg Nebulization (Mouth) Herbin, Florestine Avers, RN    levalbuterol Penne Lash) nebulizer solution 0.63 mg    Date Action Dose Route User   09/30/2018 1121 Given 0.63 mg Nebulization (Mouth) Potts, Cherina M, CMA    methylPREDNISolone sodium succinate (SOLU-MEDROL) 125 mg/2 mL injection 125 mg    Date Action Dose Route User   09/09/2018 1436 Given 125 mg Intramuscular (Right Deltoid) Maxie Barb, RN      PFT Results Latest Ref Rng & Units 12/04/2015  FVC-Predicted Pre % 49  FVC-Post L 1.45  FVC-Predicted Post % 55  Pre FEV1/FVC % % 50  Post FEV1/FCV % % 55  FEV1-Pre L 0.64  FEV1-Predicted Pre % 31  FEV1-Post L 0.80  DLCO UNC% % 66  DLCO COR %Predicted % 109    No results found for: NITRICOXIDE      Assessment & Plan:   Moderate persistent asthma with exacerbation Slow  to resolve flare  Check sputum cx , AFB , fungal - immunosuppressed pt .  ? ACE may be aggravating cough and wheezing  Control for cough   Plan  Patient Instructions  Sputum culture today .  Doxycycline 100mg  Twice daily  For 1 week , - to have on hold if symptoms worsen with discolored mucus .  Discuss with Cardiology that Lisinopril may be aggravating your cough .    Mucinex Twice daily  As needed  Cough/congestion  Delsym 2 tsp Twice daily  As needed  Cough  Increase BREO 200 until same is complete then resume BREO 100 daily , rinse after use.  Albuterol Inhaler /  Neb every 4-6 hr as needed wheezing .  Follow up with Dr. Elsworth Soho  In 4-6 weeks or [place] NP .  Please contact office for sooner follow up if symptoms do not improve or worsen or seek emergency care       Chronic respiratory failure with hypoxia ([place]) Cont on O2 At bedtime       Rexene Edison, NP 09/30/2018

## 2018-09-30 NOTE — Patient Instructions (Addendum)
Sputum culture today .  Doxycycline 100mg  Twice daily  For 1 week , - to have on hold if symptoms worsen with discolored mucus .  Discuss with Cardiology that Lisinopril may be aggravating your cough .  Mucinex Twice daily  As needed  Cough/congestion  Delsym 2 tsp Twice daily  As needed  Cough  Increase BREO 200 until same is complete then resume BREO 100 daily , rinse after use.  Albuterol Inhaler /  Neb every 4-6 hr as needed wheezing .  Follow up with Dr. Elsworth Soho  In 4-6 weeks or [place] NP .  Please contact office for sooner follow up if symptoms do not improve or worsen or seek emergency care

## 2018-09-30 NOTE — Assessment & Plan Note (Signed)
Cont on O2 At bedtime   

## 2018-10-03 ENCOUNTER — Ambulatory Visit: Payer: Medicare Other | Admitting: Internal Medicine

## 2018-10-04 ENCOUNTER — Telehealth: Payer: Self-pay | Admitting: Internal Medicine

## 2018-10-04 NOTE — Telephone Encounter (Signed)
I reviewed the last few notes from our clinic and Cardiology.  I do not see that any medications have been changed recently. I will send an inbasket message to Dr. Blenda Mounts PA and see if someone can call her.    Please remind her to bring in her medications whenever she sees Cardiology or our office.  I think she is confused about her medications and needs some clarification.    Offer her a pharmacist visit to review med list and medications.   Thank you!

## 2018-10-04 NOTE — Telephone Encounter (Signed)
rtc to pt. She is frustrated because she states all these doctors order medicines and they dont talk to each other then the medicine causes problems and another doctor tells her the first shouldn't have ordered that because it causes the problem. She would like for dr Daryll Drown to speak w/ cardiology and figure out what she should take and what she shouldn't take. She states she has been trying to talk to someone at the cardiology office and cant get through, she knows doctors can do things like that.

## 2018-10-04 NOTE — Telephone Encounter (Signed)
Pt would like Dr Daryll Drown to call her back; pt contact [phone]

## 2018-10-05 ENCOUNTER — Encounter (INDEPENDENT_AMBULATORY_CARE_PROVIDER_SITE_OTHER): Payer: Self-pay

## 2018-10-05 ENCOUNTER — Ambulatory Visit (INDEPENDENT_AMBULATORY_CARE_PROVIDER_SITE_OTHER): Payer: Self-pay | Admitting: Psychology

## 2018-10-05 NOTE — Progress Notes (Unsigned)
  Office: [phone]  /  Fax: [phone]    Date: October 05, 2018   Time Seen:*** Duration:*** Provider: Glennie Isle, Psy.D. Type of Session: Individual Therapy  Type of Contact: Face-to-face  Session Content: Shannon [name] is a 63 y.o. female presenting for a follow-up appointment to address the previously established treatment goal of decreasing emotional eating. The session was initiated with the administration of the PHQ-9 and GAD-7, as well as a brief check-in. *** Shannon [name] was receptive to today's session as evidenced by openness to sharing, responsiveness to feedback, and ***.  Mental Status Examination: Shannon [name] arrived on time for the appointment. She presented as appropriately dressed and groomed. Shannon [name] appeared her stated age and demonstrated adequate orientation to time, place, person, and purpose of the appointment. She also demonstrated appropriate eye contact. No psychomotor abnormalities or behavioral peculiarities noted. Her mood was {gbmood:21757} with congruent affect. Her thought processes were logical, linear, and goal-directed. No hallucinations, delusions, bizarre thinking or behavior reported or observed. Judgment, insight, and impulse control appeared to be grossly intact. There was no evidence of paraphasias (i.e., errors in speech, gross mispronunciations, and word substitutions), repetition deficits, or disturbances in volume or prosody (i.e., rhythm and intonation). There was no evidence of attention or memory impairments. Shannon [name] denied current suicidal and homicidal ideation, plan and intent.   Structured Assessment Results: The Patient Health Questionnaire-9 (PHQ-9) is a self-report measure that assesses symptoms and severity of depression over the course of the last two weeks. Shannon [name] obtained a score of *** suggesting {GBPHQ9SEVERITY:21752}. Shannon [name] finds the endorsed symptoms to be {gbphq9difficulty:21754}.  The Generalized Anxiety Disorder-7 (GAD-7) is a brief  self-report measure that assesses symptoms of anxiety over the course of the last two weeks. Shannon [name] obtained a score of *** suggesting {gbgad7severity:21753}.  The Montreal Cognitive Assessment (MoCA) was administered. The MoCA assesses different cognitive domains: attention and concentration, executive functions, memory, language, visuoconstructional skills, conceptual thinking, calculations, and orientation. Shannon [name] received *** out of 30 points possible on the MoCA, which is noted in the *** range. ***[A point was added to the total score due to years of formal education being 12 years or fewer.] The following points were lost: ***  Interventions:  {Interventions:22172}  Administration of MoCA  DSM-5 Diagnosis: 311 (F32.8) Other Specified Depressive Disorder, Emotional Eating Behaviors  Treatment Goal & Progress: During the initial appointment with this provider, the following treatment goal was established: decrease emotional eating. Shannon [name] has demonstrated progress in her goal as evidenced by ***  Plan: Shannon [name] continues to appear able and willing to participate as evidenced by engagement in reciprocal conversation, and asking questions for clarification as appropriate.*** The next appointment will be scheduled in {gbweeks:21758}. The next session will focus on reviewing learned skills, and working towards the established treatment goal.***

## 2018-10-06 ENCOUNTER — Ambulatory Visit: Payer: Medicare Other | Admitting: Internal Medicine

## 2018-10-07 NOTE — Telephone Encounter (Signed)
Thank you for helping her.  I will speak further with her at our appointment.

## 2018-10-07 NOTE — Telephone Encounter (Signed)
I scheduled to meet with patient 3/18 after appointment with Dr Daryll Drown

## 2018-10-07 NOTE — Telephone Encounter (Signed)
Called pt, she vented about all her medicines and troubles with doctors once again she states she has stopped her lisinopril and will talk to dr Daryll Drown about it at her next appt, she also changed her next appt with dr Daryll Drown because the appt 3/11 wa too early in the am, she is now booked for 3/18 alittle later in the am, states she would prefer 1030 appts. It looks like most of her dr appts are early in the morning, wondered if she at one time preferred early am appts??? Reminded her to always bring all her meds with her. Offered appt several times with dr Maudie Mercury but she never answered yes or no. Maybe dr Maudie Mercury can speak to her at 3/18 visit and see if she will make appt with her.

## 2018-10-12 ENCOUNTER — Ambulatory Visit (HOSPITAL_COMMUNITY): Admission: RE | Admit: 2018-10-12 | Payer: Medicare Other | Source: Ambulatory Visit

## 2018-10-13 DIAGNOSIS — J189 Pneumonia, unspecified organism: Secondary | ICD-10-CM | POA: Diagnosis not present

## 2018-10-13 DIAGNOSIS — G4733 Obstructive sleep apnea (adult) (pediatric): Secondary | ICD-10-CM | POA: Diagnosis not present

## 2018-10-28 ENCOUNTER — Ambulatory Visit (HOSPITAL_COMMUNITY)
Admission: RE | Admit: 2018-10-28 | Discharge: 2018-10-28 | Disposition: A | Discharge status: Home / Self Care | Payer: Medicare Other | Source: Ambulatory Visit | Attending: Internal Medicine | Admitting: Internal Medicine

## 2018-10-28 DIAGNOSIS — I77810 Thoracic aortic ectasia: Secondary | ICD-10-CM | POA: Diagnosis not present

## 2018-10-28 DIAGNOSIS — I712 Thoracic aortic aneurysm, without rupture: Secondary | ICD-10-CM | POA: Diagnosis not present

## 2018-10-28 LAB — CREATININE, SERUM
Creatinine, Ser: 1.33 mg/dL — ABNORMAL HIGH (ref 0.44–1.00)
GFR calc Af Amer: 49 mL/min — ABNORMAL LOW (ref >60)
GFR calc non Af Amer: 42 mL/min — ABNORMAL LOW (ref >60)

## 2018-10-28 MED ORDER — GADOBUTROL 1 MMOL/ML IV SOLN
10.0000 mL | Freq: Once | INTRAVENOUS | Status: AC | PRN
  Administered 2018-10-28: 10 mL via INTRAVENOUS

## 2018-10-28 NOTE — Progress Notes (Signed)
Patient asked if BP and HR be checked before placing PIV, pt stated she was concerned with elevated BP and did not want to receive IV contrast if BP Is elevated. BP 138/77 HR 105 Notified Pt of BP and HR, pt stated she will receive IV contrast for the MRI.

## 2018-10-31 ENCOUNTER — Ambulatory Visit: Payer: Medicare Other | Admitting: Internal Medicine

## 2018-11-01 ENCOUNTER — Other Ambulatory Visit: Payer: Self-pay | Admitting: Adult Health

## 2018-11-01 NOTE — Progress Notes (Signed)
Office: [phone]  /  Fax: [phone]    Date: 11/07/2018 Time Seen: 4:05pm Duration: 37 minutes Provider: Glennie Isle, Psy.D. Type of Session: Individual Therapy  Type of Contact: Face-to-face  Session Content: Shannon [name] is a 63 y.o. female presenting for a follow-up appointment to address the previously established treatment goal of decreasing emotional eating. The session was initiated with the administration of the PHQ-9 and GAD-7, as well as a brief check-in. Shannon [name] shared about ongoing medical concerns, which contributed to decreased mood and worry. She denied experiencing hopelessness. She added, "My sister and niece stay in contact, and my neighbor helps me out." Shannon [name] discussed her health has resulted in her stopping school. Regarding eating, Shannon [name] reported, "I keep eating. I'm overeating. I don't go anywhere." She noted she was able to discuss her eating habits and medical concerns with Abby Potash, PA-C. Shannon [name] added, "This starts a new regiment [referring her food choices] for me." The MoCA was administered due to [employer] reporting memory difficulties at the initial appointment.   Regarding emotional eating, Shannon [name] stated, "It's out of hand." When asked about what she observed with her eating patterns as it relates to emotional versus physical hunger, Shannon [name] indicated, "I do not remember."  As such, psychoeducation regarding emotional versus physical hunger was provided. Shannon [name] was given an additional handout to utilize between now and the next appointment to increase awareness of hunger patterns and subsequent eating. Notably, due to uncertainty related to the coronavirus, this provider and Mattie discussed using emergency resources shared during the first appointment should this provider's office be closed. Shannon [name] shared she still has the handout for emergency resources, and was agreeable to using the resources if needed. Shannon [name] was receptive to today's session as  evidenced by openness to sharing, responsiveness to feedback, and willingness to explore hunger patterns.  Mental Status Examination: Shannon [name] arrived on time for the appointment; however, the appointment was initiated late due to a delay in the check-in process. She presented as appropriately dressed and groomed. Shannon [name] appeared her stated age and demonstrated adequate orientation to time, place, person, and purpose of the appointment. She also demonstrated appropriate eye contact. No psychomotor abnormalities or behavioral peculiarities noted. Her mood was euthymic with congruent affect. Her thought processes were logical, linear, and goal-directed. No hallucinations, delusions, bizarre thinking or behavior reported or observed. Judgment, insight, and impulse control appeared to be grossly intact. There was no evidence of paraphasias (i.e., errors in speech, gross mispronunciations, and word substitutions), repetition deficits, or disturbances in volume or prosody (i.e., rhythm and intonation). There was no evidence of attention or memory impairments. Shannon [name] denied current suicidal and homicidal ideation, plan and intent.   The Montreal Cognitive Assessment (MoCA) was administered. The MoCA assesses different cognitive domains: attention and concentration, executive functions, memory, language, visuoconstructional skills, conceptual thinking, calculations, and orientation. It is important to note that symptoms of depression and anxiety can impact the results on this measure. At the beginning of today's appointment, Shannon [name] reported a decrease in her mood secondary to medical conditions. She also reported decreased sleep due to current medical conditions. As such, results of the MoCA should be interpreted with caution. Shannon [name] received 25 out of 30 points possible on the MoCA, which is noted in the below normal range. Of note, a point was added to the total score due to years of formal education being 12 years  or fewer. One point was lost on the visuospatial/executive task requiring Shannon [name] to replicate a visual stimuli.  She attempted to  draw the visual stimuli twice, and noted "I do not know." As such, she asked to move on to the next task. Five points were lost on the delayed recall task as Shannon [name] could not recall any words after a short delay. With category cues, she could not recall the words; however, with additional multiple choice cues, she recalled 1 out of 5 words.  Structured Assessment Results: The Patient Health Questionnaire-9 (PHQ-9) is a self-report measure that assesses symptoms and severity of depression over the course of the last two weeks. Shannon [name] obtained a score of 11 suggesting moderate depression. Shannon [name] finds the endorsed symptoms to be somewhat difficult. Depression screen PHQ 2/9 11/07/2018  Decreased Interest 1  Down, Depressed, Hopeless 1  PHQ - 2 Score 2  Altered sleeping 3  Tired, decreased energy 2  Change in appetite 2  Feeling bad or failure about yourself  0  Trouble concentrating 1  Moving slowly or fidgety/restless 1  Suicidal thoughts 0  PHQ-9 Score 11  Difficult doing work/chores -  Some recent data might be hidden   The Generalized Anxiety Disorder-7 (GAD-7) is a brief self-report measure that assesses symptoms of anxiety over the course of the last two weeks. Shannon [name] obtained a score of 8 suggesting mild anxiety. GAD 7 : Generalized Anxiety Score 11/07/2018  Nervous, Anxious, on Edge 1  Control/stop worrying 1  Worry too much - different things 1  Trouble relaxing 2  Restless 1  Easily annoyed or irritable 1  Afraid - awful might happen 1  Total GAD 7 Score 8  Anxiety Difficulty Somewhat difficult   Interventions:  Administration of PHQ-9 and GAD-7 for symptom monitoring Administration of MoCA Empathic reflections and validation Psychoeducation regarding physical versus emotional hunger Rapport building Brief chart review  DSM-5 Diagnosis: 311  (F32.8) Other Specified Depressive Disorder, Emotional Eating Behaviors  Treatment Goal & Progress: During the initial appointment with this provider, the following treatment goal was established: decrease emotional eating. Progress is limited, as Shannon [name] has just begun treatment with this provider; however, she is receptive to the interaction and interventions and rapport is being established. Due to the lapse since the last appointment, physical and emotional hunger was discussed further.   Plan: Shannon [name] continues to appear able and willing to participate as evidenced by engagement in reciprocal conversation, and asking questions for clarification as appropriate. The next appointment will be scheduled in two weeks. The next session will focus on discussing triggers for emotional eating.

## 2018-11-01 NOTE — Telephone Encounter (Signed)
Received faxed refill request from [facility]  Medication name/strength/dose: Breo 200 Medication last rx'd: 09/30/2018 Quantity and number of refills last rx'd: #1 with no refills  Patient last seen in the office on 09/30/2018  Does refill need to be authorized by a provider? no Refill authorized (yes or no)?: no - pt was to be given a sample of Breo 200 to be completed for acute visit, then resume normal 134mcg dose.  See below:  Patient Instructions  Sputum culture today .  Doxycycline 100mg  Twice daily  For 1 week , - to have on hold if symptoms worsen with discolored mucus .  Discuss with Cardiology that Lisinopril may be aggravating your cough .  Mucinex Twice daily  As needed  Cough/congestion  Delsym 2 tsp Twice daily  As needed  Cough  Increase BREO 200 until same is complete then resume BREO 100 daily , rinse after use.  Albuterol Inhaler /  Neb every 4-6 hr as needed wheezing .  Follow up with Dr. Elsworth Soho  In 4-6 weeks or [place] NP .  Please contact office for sooner follow up if symptoms do not improve or worsen or seek emergency care

## 2018-11-02 ENCOUNTER — Encounter: Payer: Medicare Other | Admitting: Internal Medicine

## 2018-11-03 ENCOUNTER — Ambulatory Visit (INDEPENDENT_AMBULATORY_CARE_PROVIDER_SITE_OTHER): Payer: Medicare Other | Admitting: Infectious Diseases

## 2018-11-03 ENCOUNTER — Other Ambulatory Visit: Payer: Self-pay

## 2018-11-03 ENCOUNTER — Encounter: Payer: Self-pay | Admitting: Infectious Diseases

## 2018-11-03 VITALS — BP 153/74 | HR 75 | Ht 67.0 in | Wt 228.0 lb

## 2018-11-03 DIAGNOSIS — B20 Human immunodeficiency virus [HIV] disease: Secondary | ICD-10-CM | POA: Diagnosis not present

## 2018-11-03 DIAGNOSIS — M7989 Other specified soft tissue disorders: Secondary | ICD-10-CM | POA: Insufficient documentation

## 2018-11-03 DIAGNOSIS — I10 Essential (primary) hypertension: Secondary | ICD-10-CM

## 2018-11-03 DIAGNOSIS — R0602 Shortness of breath: Secondary | ICD-10-CM

## 2018-11-03 DIAGNOSIS — R0609 Other forms of dyspnea: Secondary | ICD-10-CM | POA: Diagnosis not present

## 2018-11-03 NOTE — Assessment & Plan Note (Signed)
Her HIV has been under good control for several years. She has missed a few appointments with [name]. Reminded her that it is important she follow up regularly if at least to check in and ensure no DDI's with HIV meds considering her long medication list.  Check HIV labs today.  Return in about 4 months (around 03/05/2019).

## 2018-11-03 NOTE — Progress Notes (Signed)
Name: MAURIANNA BENARD  DOB: August 21, 1956 MRN: [id number] PCP: [name], MD    Patient Active Problem List   Diagnosis Date Noted  . Leg swelling 11/03/2018  . Other fatigue 06/30/2018  . Shortness of breath on exertion 06/30/2018  . Hyperglycemia 06/30/2018  . Vitamin D deficiency 06/30/2018  . B12 nutritional deficiency 06/30/2018  . Chronic anticoagulation 01/03/2018  . Chronic respiratory failure with hypoxia ([place]) 11/30/2017  . Constipation 11/03/2017  . Allergic rhinitis 05/14/2017  . Atrial flutter ([place]) 03/11/2017  . CAD (coronary artery disease) 01/28/2017  . Obesity (BMI 30-39.9) 01/13/2017  . Ascending aorta dilatation (HCC) 01/07/2017  . Low back pain radiating to right lower extremity 12/02/2016  . Bilateral renal cysts 12/02/2016  . Chronic kidney disease (CKD), stage III (moderate) ([place]) 12/01/2016  . Generalized anxiety disorder 10/22/2016  . Hypersomnia 10/01/2016  . Accessory skin tags 06/10/2016  . Nocturnal hypoxemia 05/13/2016  . Cervical radiculopathy 04/14/2016  . GERD (gastroesophageal reflux disease) 01/02/2016  . Vitamin B12 deficiency 10/02/2015  . Dyspnea 07/09/2015  . Moderate persistent asthma with exacerbation 07/09/2015  . HIV disease ([place]) 07/09/2015  . Uncontrolled hypertension 07/09/2015     Brief Narrative:  DORTHA NEIGHBORS is a 63 y.o. female with HIV dx 1990s.  OI Hx: unknown, CD4 nadir unknown.   Previous Regimens: . Raltegrivir + Darunavir/ritonovir . Tivicay + Descovy . Biktarvy   Genotypes: . None on file   Subjective:  CC: HIV follow up care. Missed a few appointment with [name]. Has had increased shortness of breath, exertional intolerance, leg swelling lately.   HPI: Emine has a few concerns today over her health. She is interested in the results of her heart MRI recently and worried about her health. She has had a terrible hacking cough now for a few months. Initially treated with amoxicillin and prednisone  taper by primary care team w/o improvemnet; CXR in January w/o any infiltrate. Using her inhaler daily (Breo). She tells me she saw her pulmonology team and they felt this may be related to ACE inhibitor - she has since this on he own and her coughing has improved significantly (although she also got another antibiotic at this time for treatment). She is oxygen dependent at night. She feels that her central obesity is contributing to her breathing and is mentioning a bit about her kidney function being off.  She has also had some associated swelling of her legs for several weeks to where they are painful for her. This is improving however. She describes DOE and is sleeping elevated on pillows with frequent night awakenings. She has felt some chest palpitations a little more often recently - says her pulse ox monitor says her heart rate is irregular (she has a known h/o aflutter on chronic anticoagulation). She says her blood pressures at home are 180-200/90-100s. She denies any headaches. She goes back and forth with which medications she is taking regularly especially her blood pressure pills. She is taking her biktarvy faithfully but as far as some of the other medications I am skeptical she is taking them - seems to be out of fear/concern/misunderstanding.   She is s/p hysterectomy for reasons unrelated to dysplasia.   Review of Systems  Constitutional: Positive for malaise/fatigue. Negative for chills and fever.  HENT: Negative for congestion, sinus pain and sore throat.   Respiratory: Positive for cough (improved) and shortness of breath. Negative for sputum production and wheezing.   Cardiovascular: Positive for palpitations, orthopnea and  leg swelling. Negative for chest pain.  Gastrointestinal: Positive for constipation. Negative for abdominal pain, diarrhea and vomiting.  Genitourinary: Negative for dysuria.  Musculoskeletal: Negative for myalgias.  Skin: Negative for rash.  Neurological:  Negative for tingling, focal weakness and headaches.    Past Medical History:  Diagnosis Date  . Anemia   . Anxiety    HX PANIC ATTACKS  . Arthritis    "starting to; in my hands" (07/09/2015)  . Asthma   . Atrial fibrillation ([place])   . Atrial flutter, paroxysmal ([place])   . Bloated abdomen   . CFS (chronic fatigue syndrome)   . Chewing difficulty   . Chronic asthma with acute exacerbation    "I have chronic asthma all the time; sometimes exacerbations" (07/09/2015)  . Chronic lower back pain   . COPD (chronic obstructive pulmonary disease) ([place])   . Cyst of right kidney    "3 of them; dx'd in ~ 01/2015"  . Dyspnea   . GERD (gastroesophageal reflux disease)   . Heart murmur   . History of blood transfusion    "related to my brain surgery I think"  . History of pulmonary embolism 07/09/2015  . HIV antibody positive ([place])   . HIV disease ([place])   . Hyperlipidemia   . Hypertension   . Leg edema   . Lipodystrophy   . Mild CAD 2013  . Palpitations   . Pneumonia 07/09/2015  . Shingles   . Sleep apnea    "never completed part 2 of study; never wore mask" (07/09/2015)  . Vitamin B 12 deficiency   . Vitamin D deficiency     Outpatient Medications Prior to Visit  Medication Sig Dispense Refill  . acetaminophen (TYLENOL) 325 MG tablet Take 325 mg by mouth every 6 (six) hours as needed (FOR PAIN).    [name] albuterol (PROAIR HFA) 108 (90 Base) MCG/ACT inhaler INHALE 2 PUFFS INTO THE LUNGS EVERY 4 HOURS AS NEEDED FOR WHEEZING OR SHORTNESS OF BREATH 8.5 g 6  . albuterol (PROVENTIL) (2.5 MG/3ML) 0.083% nebulizer solution USE 1 VIAL VIA NEBULIZER EVERY 6 HOURS AS NEEDED FOR WHEEZING OR SHORTNESS OF BREATH 75 mL 6  . Ascorbic Acid (VITAMIN C) 1000 MG tablet Take 1,000 mg by mouth 2 (two) times a week.     [name] azelastine (OPTIVAR) 0.05 % ophthalmic solution Place 2 drops into both eyes 2 (two) times daily as needed (for swelling or itching).   3  . BIKTARVY 50-200-25 MG TABS tablet TAKE 1 TABLET  BY MOUTH DAILY (Patient taking differently: 1 tablet daily. ) 30 tablet 5  . BIOTIN PO Take 1 tablet by mouth daily as needed (for supplementation).     . Cholecalciferol (SM VITAMIN D3) 100 MCG (4000 UT) CAPS Take 1 capsule (4,000 Units total) by mouth daily. 30 capsule 0  . cloNIDine (CATAPRES) 0.1 MG tablet TAKE 1 TABLET TWICE A DAY AS NEEDED FOR SYSTOLIC BLOOD PRESSURE (TOP NUMBER) GREATER THAN 160 60 tablet 5  . cyanocobalamin (,VITAMIN B-12,) 1000 MCG/ML injection ADMINISTER 1 ML(1000 MCG) IN THE MUSCLE EVERY 30 DAYS 1 mL 0  . diltiazem (CARDIZEM CD) 240 MG 24 hr capsule Take 1 capsule (240 mg total) by mouth daily. 90 capsule 3  . doxycycline (VIBRA-TABS) 100 MG tablet Take 1 tablet (100 mg total) by mouth 2 (two) times daily. 14 tablet 0  . fluticasone furoate-vilanterol (BREO ELLIPTA) 100-25 MCG/INH AEPB Inhale 1 puff into the lungs daily. Do not take if also taking Trelegy  28 each 1  . fluticasone furoate-vilanterol (BREO ELLIPTA) 200-25 MCG/INH AEPB Inhale 1 puff into the lungs daily. 1 each 0  . furosemide (LASIX) 40 MG tablet Take 1 tablet (40 mg total) by mouth daily. 90 tablet 3  . loratadine (CLARITIN) 10 MG tablet Take 1 tablet (10 mg total) by mouth daily. 30 tablet 2  . LORazepam (ATIVAN) 0.5 MG tablet Take 1 tablet (0.5 mg total) by mouth 3 (three) times daily as needed for anxiety. 30 tablet 0  . Melatonin 5 MG TABS Take 5 mg by mouth at bedtime as needed (for sleep).    . Mouthwashes (BIOTENE DRY MOUTH GENTLE) LIQD Use as directed 1 Dose in the mouth or throat 2 (two) times daily. 1 Bottle 0  . NIFEdipine (ADALAT CC) 90 MG 24 hr tablet Take 1 tablet (90 mg total) by mouth daily. 30 tablet 3  . omeprazole (PRILOSEC) 40 MG capsule Take 1 capsule (40 mg total) by mouth daily. 90 capsule 3  . OXYGEN Inhale 1.5-2 L into the lungs continuous.     . polyethylene glycol (MIRALAX / GLYCOLAX) packet Take 17 g by mouth daily. 14 each 0  . potassium chloride (K-DUR) 10 MEQ tablet TAKE 1  TABLET(10 MEQ) BY MOUTH EVERY OTHER DAY 45 tablet 2  . potassium chloride SA (K-DUR,KLOR-CON) 20 MEQ tablet Take 1 tablet (20 mEq total) by mouth daily. 90 tablet 3  . PREVIDENT 5000 SENSITIVE 1.1-5 % PSTE Use daily as directed  1  . senna-docusate (SENOKOT-S) 8.6-50 MG tablet Take 1 tablet by mouth at bedtime as needed for mild constipation. 30 tablet 0  . sodium chloride (OCEAN) 0.65 % SOLN nasal spray Place 1 spray into both nostrils as needed for congestion. 88 mL 0  . Tetrahydrozoline HCl (VISINE OP) Place 2 drops into both eyes daily as needed (for dry eyes).     Alveda Reasons 20 MG TABS tablet TAKE 1 TABLET(20 MG) BY MOUTH DAILY WITH SUPPER 30 tablet 5  . lisinopril (PRINIVIL,ZESTRIL) 40 MG tablet Take 1 tablet (40 mg total) by mouth daily. 90 tablet 3   No facility-administered medications prior to visit.      Allergies  Allergen Reactions  . Tree Extract Swelling and Other (See Comments)    Swelling to eyes  . Lisinopril Cough    Face/throat swelling  . Ciprofloxacin Hives    Social History   Tobacco Use  . Smoking status: Never Smoker  . Smokeless tobacco: Never Used  Substance Use Topics  . Alcohol use: Yes    Alcohol/week: 0.0 standard drinks    Comment: Rarely.  . Drug use: No    Family History  Problem Relation Age of Onset  . Asthma Mother   . Heart failure Mother        cardiomyopathy  . Thyroid disease Mother   . Heart disease Mother   . Obesity Mother   . Heart murmur Sister   . Heart murmur Brother   . Diabetes Sister   . Thyroid disease Sister   . Heart murmur Sister     Social History   Substance and Sexual Activity  Sexual Activity Not Currently  . Partners: Male   Comment: declined condoms     Objective:   Vitals:   11/03/18 1343  BP: (!) 153/74  Pulse: 75  Weight: 228 lb (103.4 kg)  Height: 5\' 7"  (1.702 m)   Body mass index is 35.71 kg/m.  Physical Exam Constitutional:  Appearance: Normal appearance. She is obese. She is  not ill-appearing.  HENT:     Mouth/Throat:     Mouth: Mucous membranes are moist.     Pharynx: Oropharynx is clear.  Eyes:     General: No scleral icterus.    Pupils: Pupils are equal, round, and reactive to light.  Cardiovascular:     Rate and Rhythm: Normal rate and regular rhythm.     Pulses: Normal pulses.     Heart sounds: Murmur (diastolic 2/6 RUSB ) present.  Pulmonary:     Effort: Pulmonary effort is normal.     Breath sounds: Normal breath sounds. No wheezing or rales.  Abdominal:     General: Bowel sounds are normal. There is no distension.     Tenderness: There is no abdominal tenderness.  Musculoskeletal:     Right lower leg: Edema (2+) present.     Left lower leg: Edema (2+) present.  Skin:    General: Skin is warm and dry.     Capillary Refill: Capillary refill takes less than 2 seconds.     Coloration: Skin is not jaundiced.  Neurological:     Mental Status: She is alert and oriented to person, place, and time.  Psychiatric:        Mood and Affect: Mood is anxious.        Behavior: Behavior is hyperactive.        Cognition and Memory: Cognition normal.        Judgment: Judgment normal.     Lab Results Lab Results  Component Value Date   WBC 5.8 06/29/2018   HGB 12.5 06/29/2018   HCT 38.6 06/29/2018   MCV 94 06/29/2018   PLT 203 06/29/2018    Lab Results  Component Value Date   CREATININE 1.33 (H) 10/28/2018   BUN 18 08/26/2018   NA 144 08/26/2018   K 4.3 08/26/2018   CL 101 08/26/2018   CO2 28 08/26/2018    Lab Results  Component Value Date   ALT 20 06/30/2018   AST 22 06/30/2018   ALKPHOS 86 06/30/2018   BILITOT 0.3 06/30/2018    Lab Results  Component Value Date   CHOL 253 (H) 05/03/2018   HDL 86 05/03/2018   LDLCALC 156 (H) 05/03/2018   TRIG 53 05/03/2018   CHOLHDL 2.9 05/03/2018   HIV 1 RNA Quant (copies/mL)  Date Value  02/01/2018 <20 NOT DETECTED  12/13/2017 47 (H)  09/02/2017 <20 NOT DETECTED   CD4 T Cell Abs (/uL)   Date Value  12/13/2017 670  09/02/2017 520  05/12/2017 620     Assessment & Plan:   Problem List Items Addressed This Visit      Unprioritized   HIV disease ([place]) - Primary (Chronic)    Her HIV has been under good control for several years. She has missed a few appointments with [name]. Reminded her that it is important she follow up regularly if at least to check in and ensure no DDI's with HIV meds considering her long medication list.  Check HIV labs today.  Return in about 4 months (around 03/05/2019).       Relevant Orders   HIV-1 RNA quant-no reflex-bld   T-helper cell (CD4)- ([facility] clinic only)   COMPLETE METABOLIC PANEL WITH GFR   CBC with Differential/Platelet   Uncontrolled hypertension (Chronic)    Her BP today is over range but values reported by her at home are excessively high. I don't get the  feeling she is taking many of her intended blood pressure medications. I know she has herself stopped her lisinopril. She is only taking lasix a few days a week but I am not sure she understands how to take this either. She describes "no salt" diet and working with [employer] weight loss program which she likes a lot. I have a feeling she is drinking fluids quite liberally. She also is likely retaining extra fluid d/t prednisone use recently.  Will CC: note to her primary and cardiology team. I tried most of the visit to get her to understand the link between her peripheral swelling/DOE and the fact that her blood pressure is uncontrolled.       Shortness of breath on exertion    She has 2+ edema, inhibited functional capacity and limited ability to walk distances and climb stairs. She has a murmur - echo in may 2018 with moderate AI at that time. She has a TAA that was evaluated recently on MRI - stable ascending aneurysm @ 4 cm.  She has follow up with cardiology team however I think she may need sooner evaluation to optimize her BP medications and diuretics. She probably needs a  repeated echo also considering she is a little over loaded and has such complaints about inability to do normal activities now and SOB.       Relevant Orders   B Nat Peptide   Leg swelling    Discussed compression stockings to help mobilize fluid in addition to daily weights at home and need to take lasix as needed for weight gain > 2 lbs in 24h or 5 lbs in 1 week.          Janene Madeira, MSN, NP-C [facility] [place] Pager: [phone] Office: [phone]  11/03/18  8:04 PM

## 2018-11-03 NOTE — Assessment & Plan Note (Signed)
Discussed compression stockings to help mobilize fluid in addition to daily weights at home and need to take lasix as needed for weight gain > 2 lbs in 24h or 5 lbs in 1 week.

## 2018-11-03 NOTE — Assessment & Plan Note (Signed)
She has 2+ edema, inhibited functional capacity and limited ability to walk distances and climb stairs. She has a murmur - echo in may 2018 with moderate AI at that time. She has a TAA that was evaluated recently on MRI - stable ascending aneurysm @ 4 cm.  She has follow up with cardiology team however I think she may need sooner evaluation to optimize her BP medications and diuretics. She probably needs a repeated echo also considering she is a little over loaded and has such complaints about inability to do normal activities now and SOB.

## 2018-11-03 NOTE — Patient Instructions (Addendum)
I think you need an earlier appointment with your cardiologist - will send her a note but I want you to call also.   Your blood pressure is very high - I think this is making your swelling much worse and also is straining your heart making your feel very tired.   For your leg swelling - elevate them above your heart, compression stockings that are knee high 15-20 mmHg or 20-30 mmHg.   For your dry mouth - try some sugar free hard candies, Biotene mouth rinse (can get over the counter at pharmacy).    Please continue taking your Biktarvy once a day as you are. Will check your labs today and have you back to see [place] in 4 months - I need you to work on your other appointments to get your heart and breathing under better control.

## 2018-11-03 NOTE — Assessment & Plan Note (Signed)
Her BP today is over range but values reported by her at home are excessively high. I don't get the feeling she is taking many of her intended blood pressure medications. I know she has herself stopped her lisinopril. She is only taking lasix a few days a week but I am not sure she understands how to take this either. She describes "no salt" diet and working with [employer] weight loss program which she likes a lot. I have a feeling she is drinking fluids quite liberally. She also is likely retaining extra fluid d/t prednisone use recently.  Will CC: note to her primary and cardiology team. I tried most of the visit to get her to understand the link between her peripheral swelling/DOE and the fact that her blood pressure is uncontrolled.

## 2018-11-04 LAB — T-HELPER CELL (CD4) - (RCID CLINIC ONLY)
CD4 % Helper T Cell: 39 % (ref 33–55)
CD4 T CELL ABS: 640 /uL (ref 400–2700)

## 2018-11-07 ENCOUNTER — Other Ambulatory Visit: Payer: Self-pay

## 2018-11-07 ENCOUNTER — Encounter (INDEPENDENT_AMBULATORY_CARE_PROVIDER_SITE_OTHER): Payer: Self-pay | Admitting: Physician Assistant

## 2018-11-07 ENCOUNTER — Ambulatory Visit (INDEPENDENT_AMBULATORY_CARE_PROVIDER_SITE_OTHER): Payer: Medicare Other | Admitting: Physician Assistant

## 2018-11-07 ENCOUNTER — Ambulatory Visit (INDEPENDENT_AMBULATORY_CARE_PROVIDER_SITE_OTHER): Payer: Medicare Other | Admitting: Psychology

## 2018-11-07 VITALS — BP 130/72 | HR 103 | Temp 98.4°F | Ht 67.0 in | Wt 224.0 lb

## 2018-11-07 DIAGNOSIS — I10 Essential (primary) hypertension: Secondary | ICD-10-CM

## 2018-11-07 DIAGNOSIS — E538 Deficiency of other specified B group vitamins: Secondary | ICD-10-CM

## 2018-11-07 DIAGNOSIS — F3289 Other specified depressive episodes: Secondary | ICD-10-CM

## 2018-11-07 DIAGNOSIS — Z6839 Body mass index (BMI) 39.0-39.9, adult: Secondary | ICD-10-CM

## 2018-11-07 LAB — COMPLETE METABOLIC PANEL WITH GFR
AG Ratio: 1.3 (calc) (ref 1.0–2.5)
ALT: 16 U/L (ref 6–29)
AST: 20 U/L (ref 10–35)
Albumin: 4 g/dL (ref 3.6–5.1)
Alkaline phosphatase (APISO): 73 U/L (ref 37–153)
BUN/Creatinine Ratio: 13 (calc) (ref 6–22)
BUN: 23 mg/dL (ref 7–25)
CO2: 34 mmol/L — ABNORMAL HIGH (ref 20–32)
Calcium: 10.5 mg/dL — ABNORMAL HIGH (ref 8.6–10.4)
Chloride: 101 mmol/L (ref 98–110)
Creat: 1.77 mg/dL — ABNORMAL HIGH (ref 0.50–0.99)
GFR, Est African American: 35 mL/min/{1.73_m2} — ABNORMAL LOW (ref >=60)
GFR, Est Non African American: 30 mL/min/{1.73_m2} — ABNORMAL LOW (ref >=60)
GLUCOSE: 134 mg/dL — AB (ref 65–99)
Globulin: 3 g/dL (calc) (ref 1.9–3.7)
Potassium: 4.1 mmol/L (ref 3.5–5.3)
Sodium: 142 mmol/L (ref 135–146)
Total Bilirubin: 0.3 mg/dL (ref 0.2–1.2)
Total Protein: 7 g/dL (ref 6.1–8.1)

## 2018-11-07 LAB — HIV-1 RNA QUANT-NO REFLEX-BLD
HIV 1 RNA Quant: <20 copies/mL
HIV-1 RNA Quant, Log: <1.3 Log copies/mL

## 2018-11-07 LAB — CBC WITH DIFFERENTIAL/PLATELET
Absolute Monocytes: 462 cells/uL (ref 200–950)
Basophils Absolute: 23 cells/uL (ref 0–200)
Basophils Relative: 0.4 %
Eosinophils Absolute: 103 cells/uL (ref 15–500)
Eosinophils Relative: 1.8 %
HCT: 38.1 % (ref 35.0–45.0)
Hemoglobin: 12.8 g/dL (ref 11.7–15.5)
Lymphs Abs: 1522 cells/uL (ref 850–3900)
MCH: 30.8 pg (ref 27.0–33.0)
MCHC: 33.6 g/dL (ref 32.0–36.0)
MCV: 91.6 fL (ref 80.0–100.0)
MPV: 11 fL (ref 7.5–12.5)
Monocytes Relative: 8.1 %
Neutro Abs: 3591 cells/uL (ref 1500–7800)
Neutrophils Relative %: 63 %
Platelets: 196 10*3/uL (ref 140–400)
RBC: 4.16 10*6/uL (ref 3.80–5.10)
RDW: 13 % (ref 11.0–15.0)
Total Lymphocyte: 26.7 %
WBC: 5.7 10*3/uL (ref 3.8–10.8)

## 2018-11-07 LAB — BRAIN NATRIURETIC PEPTIDE: Brain Natriuretic Peptide: 26 pg/mL (ref <100)

## 2018-11-08 MED ORDER — CYANOCOBALAMIN 1000 MCG/ML IJ SOLN: INTRAMUSCULAR | 0 refills | Status: DC

## 2018-11-08 NOTE — Progress Notes (Signed)
Office: [phone]  /  Fax: [phone]   HPI:   Chief Complaint: OBESITY Shannon [name] is here to discuss her progress with her obesity treatment plan. She is on the Category 2 plan + 100 calories and is following her eating plan approximately 50% of the time. She states she is exercising 0 minutes 0 times per week. Shannon [name] reports that she has not been following the plan and states she has not been out shopping recently. She also states that she has a new diagnosis of an AAA, which is being followed up on this week. She reports she is eating oatmeal for breakfast. Her weight is 224 lb (101.6 kg) today and has had a weight gain of 1 lb since her last visit. She has lost 3 lbs since starting treatment with [place].   B12 Deficiency Shannon [name] has a diagnosis of B12 insufficiency and notes fatigue. This is not a new diagnosis. Shannon [name] is not a vegetarian and does have a previous diagnosis of pernicious anemia. She does not have a history of weight loss surgery.   Hypertension [name] is a 63 y.o. female with hypertension and is on Nifedipine, Clonidine, Diltiazem, and Furosemide.  Shannon [name] denies chest pain. She is working weight loss to help control her blood pressure with the goal of decreasing her risk of heart attack and stroke. Shannon [name]'s blood pressure is normal today.  ASSESSMENT AND PLAN:  Vitamin B12 deficiency - Plan: cyanocobalamin (,VITAMIN B-12,) 1000 MCG/ML injection  Essential hypertension  Class 2 severe obesity with serious comorbidity and body mass index (BMI) of 39.0 to 39.9 in adult, unspecified obesity type ([place])  PLAN:  B12 Deficiency Shannon [name] will work on increasing B12 rich foods in her diet. B12 supplementation was prescribed today #1 with 0 refills.  Hypertension We discussed sodium restriction, working on healthy weight loss, and a regular exercise program as the means to achieve improved blood pressure control. Shannon [name] agreed with this plan and agreed to  follow up as directed. We will continue to monitor her blood pressure as well as her progress with the above lifestyle modifications. She will continue her medications as prescribed and will watch for signs of hypotension as she continues her lifestyle modifications.  Obesity Shannon [name] is currently in the action stage of change. As such, her goal is to continue with weight loss efforts. She has agreed to follow the Category 2 plan + 100 calories. Shannon [name] has been instructed to work up to a goal of 150 minutes of combined cardio and strengthening exercise per week for weight loss and overall health benefits. We discussed the following Behavioral Modification Strategies today: increasing lean protein intake and keeping healthy foods in the home.  Shannon [name] has agreed to follow-up with our clinic in 2-3 weeks. She was informed of the importance of frequent follow-up visits to maximize her success with intensive lifestyle modifications for her multiple health conditions.  ALLERGIES: Allergies  Allergen Reactions  . Tree Extract Swelling and Other (See Comments)    Swelling to eyes  . Lisinopril Cough    Face/throat swelling  . Ciprofloxacin Hives    MEDICATIONS: Current Outpatient Medications on File Prior to Visit  Medication Sig Dispense Refill  . acetaminophen (TYLENOL) 325 MG tablet Take 325 mg by mouth every 6 (six) hours as needed (FOR PAIN).    Shannon [name] albuterol (PROAIR HFA) 108 (90 Base) MCG/ACT inhaler INHALE 2 PUFFS INTO THE LUNGS EVERY 4 HOURS AS NEEDED FOR WHEEZING OR SHORTNESS OF BREATH 8.5 g  6  . albuterol (PROVENTIL) (2.5 MG/3ML) 0.083% nebulizer solution USE 1 VIAL VIA NEBULIZER EVERY 6 HOURS AS NEEDED FOR WHEEZING OR SHORTNESS OF BREATH 75 mL 6  . Ascorbic Acid (VITAMIN C) 1000 MG tablet Take 1,000 mg by mouth 2 (two) times a week.     Shannon [name] azelastine (OPTIVAR) 0.05 % ophthalmic solution Place 2 drops into both eyes 2 (two) times daily as needed (for swelling or itching).   3  . BIKTARVY  50-200-25 MG TABS tablet TAKE 1 TABLET BY MOUTH DAILY (Patient taking differently: 1 tablet daily. ) 30 tablet 5  . BIOTIN PO Take 1 tablet by mouth daily as needed (for supplementation).     . Cholecalciferol (SM VITAMIN D3) 100 MCG (4000 UT) CAPS Take 1 capsule (4,000 Units total) by mouth daily. 30 capsule 0  . cloNIDine (CATAPRES) 0.1 MG tablet TAKE 1 TABLET TWICE A DAY AS NEEDED FOR SYSTOLIC BLOOD PRESSURE (TOP NUMBER) GREATER THAN 160 60 tablet 5  . cyanocobalamin (,VITAMIN B-12,) 1000 MCG/ML injection ADMINISTER 1 ML(1000 MCG) IN THE MUSCLE EVERY 30 DAYS 1 mL 0  . diltiazem (CARDIZEM CD) 240 MG 24 hr capsule Take 1 capsule (240 mg total) by mouth daily. 90 capsule 3  . doxycycline (VIBRA-TABS) 100 MG tablet Take 1 tablet (100 mg total) by mouth 2 (two) times daily. 14 tablet 0  . fluticasone furoate-vilanterol (BREO ELLIPTA) 100-25 MCG/INH AEPB Inhale 1 puff into the lungs daily. Do not take if also taking Trelegy 28 each 1  . fluticasone furoate-vilanterol (BREO ELLIPTA) 200-25 MCG/INH AEPB Inhale 1 puff into the lungs daily. 1 each 0  . furosemide (LASIX) 40 MG tablet Take 1 tablet (40 mg total) by mouth daily. 90 tablet 3  . loratadine (CLARITIN) 10 MG tablet Take 1 tablet (10 mg total) by mouth daily. 30 tablet 2  . LORazepam (ATIVAN) 0.5 MG tablet Take 1 tablet (0.5 mg total) by mouth 3 (three) times daily as needed for anxiety. 30 tablet 0  . Melatonin 5 MG TABS Take 5 mg by mouth at bedtime as needed (for sleep).    . Mouthwashes (BIOTENE DRY MOUTH GENTLE) LIQD Use as directed 1 Dose in the mouth or throat 2 (two) times daily. 1 Bottle 0  . NIFEdipine (ADALAT CC) 90 MG 24 hr tablet Take 1 tablet (90 mg total) by mouth daily. 30 tablet 3  . omeprazole (PRILOSEC) 40 MG capsule Take 1 capsule (40 mg total) by mouth daily. 90 capsule 3  . OXYGEN Inhale 1.5-2 L into the lungs continuous.     . polyethylene glycol (MIRALAX / GLYCOLAX) packet Take 17 g by mouth daily. 14 each 0  . potassium  chloride (K-DUR) 10 MEQ tablet TAKE 1 TABLET(10 MEQ) BY MOUTH EVERY OTHER DAY 45 tablet 2  . potassium chloride SA (K-DUR,KLOR-CON) 20 MEQ tablet Take 1 tablet (20 mEq total) by mouth daily. 90 tablet 3  . PREVIDENT 5000 SENSITIVE 1.1-5 % PSTE Use daily as directed  1  . senna-docusate (SENOKOT-S) 8.6-50 MG tablet Take 1 tablet by mouth at bedtime as needed for mild constipation. 30 tablet 0  . sodium chloride (OCEAN) 0.65 % SOLN nasal spray Place 1 spray into both nostrils as needed for congestion. 88 mL 0  . Tetrahydrozoline HCl (VISINE OP) Place 2 drops into both eyes daily as needed (for dry eyes).     Alveda Reasons 20 MG TABS tablet TAKE 1 TABLET(20 MG) BY MOUTH DAILY WITH SUPPER 30 tablet 5  No current facility-administered medications on file prior to visit.     PAST MEDICAL HISTORY: Past Medical History:  Diagnosis Date  . Anemia   . Anxiety    HX PANIC ATTACKS  . Arthritis    "starting to; in my hands" (07/09/2015)  . Asthma   . Atrial fibrillation ([place])   . Atrial flutter, paroxysmal ([place])   . Bloated abdomen   . CFS (chronic fatigue syndrome)   . Chewing difficulty   . Chronic asthma with acute exacerbation    "I have chronic asthma all the time; sometimes exacerbations" (07/09/2015)  . Chronic lower back pain   . COPD (chronic obstructive pulmonary disease) ([place])   . Cyst of right kidney    "3 of them; dx'd in ~ 01/2015"  . Dyspnea   . GERD (gastroesophageal reflux disease)   . Heart murmur   . History of blood transfusion    "related to my brain surgery I think"  . History of pulmonary embolism 07/09/2015  . HIV antibody positive ([place])   . HIV disease ([place])   . Hyperlipidemia   . Hypertension   . Leg edema   . Lipodystrophy   . Mild CAD 2013  . Palpitations   . Pneumonia 07/09/2015  . Shingles   . Sleep apnea    "never completed part 2 of study; never wore mask" (07/09/2015)  . Vitamin B 12 deficiency   . Vitamin D deficiency     PAST SURGICAL HISTORY:  Past Surgical History:  Procedure Laterality Date  . ABDOMINAL HYSTERECTOMY     "robotic laparosopic"  . BRAIN SURGERY  1974   "brain tumor; benign; on top of my brain; got a plate in there"  . CARDIAC CATHETERIZATION    . TONSILLECTOMY AND ADENOIDECTOMY      SOCIAL HISTORY: Social History   Tobacco Use  . Smoking status: Never Smoker  . Smokeless tobacco: Never Used  Substance Use Topics  . Alcohol use: Yes    Alcohol/week: 0.0 standard drinks    Comment: Rarely.  . Drug use: No    FAMILY HISTORY: Family History  Problem Relation Age of Onset  . Asthma Mother   . Heart failure Mother        cardiomyopathy  . Thyroid disease Mother   . Heart disease Mother   . Obesity Mother   . Heart murmur Sister   . Heart murmur Brother   . Diabetes Sister   . Thyroid disease Sister   . Heart murmur Sister    ROS: Review of Systems  Constitutional: Positive for malaise/fatigue. Negative for weight loss.  Cardiovascular: Negative for chest pain.   PHYSICAL EXAM: Blood pressure 130/72, pulse (!) 103, temperature 98.4 F (36.9 C), temperature source Oral, height 5\' 7"  (1.702 m), weight 224 lb (101.6 kg), SpO2 93 %. Body mass index is 35.08 kg/m. Physical Exam Vitals signs reviewed.  Constitutional:      Appearance: Normal appearance. She is obese.  Cardiovascular:     Rate and Rhythm: Normal rate.     Pulses: Normal pulses.  Pulmonary:     Effort: Pulmonary effort is normal.     Breath sounds: Normal breath sounds.  Musculoskeletal: Normal range of motion.  Skin:    General: Skin is warm and dry.  Neurological:     Mental Status: She is alert and oriented to person, place, and time.  Psychiatric:        Behavior: Behavior normal.   RECENT LABS AND TESTS:  BMET    Component Value Date/Time   NA 142 11/03/2018 1430   NA 144 08/26/2018 1209   K 4.1 11/03/2018 1430   CL 101 11/03/2018 1430   CO2 34 (H) 11/03/2018 1430   GLUCOSE 134 (H) 11/03/2018 1430   BUN 23  11/03/2018 1430   BUN 18 08/26/2018 1209   CREATININE 1.77 (H) 11/03/2018 1430   CALCIUM 10.5 (H) 11/03/2018 1430   GFRNONAA 30 (L) 11/03/2018 1430   GFRAA 35 (L) 11/03/2018 1430   Lab Results  Component Value Date   HGBA1C 5.6 06/30/2018   HGBA1C 5.5 02/23/2018   HGBA1C 5.9 (H) 01/05/2017   Lab Results  Component Value Date   INSULIN 26.4 (H) 06/30/2018   CBC    Component Value Date/Time   WBC 5.7 11/03/2018 1430   RBC 4.16 11/03/2018 1430   HGB 12.8 11/03/2018 1430   HGB 12.5 06/29/2018 0942   HCT 38.1 11/03/2018 1430   HCT 38.6 06/29/2018 0942   PLT 196 11/03/2018 1430   PLT 203 06/29/2018 0942   MCV 91.6 11/03/2018 1430   MCV 94 06/29/2018 0942   MCH 30.8 11/03/2018 1430   MCHC 33.6 11/03/2018 1430   RDW 13.0 11/03/2018 1430   RDW 12.9 06/29/2018 0942   LYMPHSABS 1,522 11/03/2018 1430   LYMPHSABS 1.2 06/29/2018 0942   MONOABS 0.1 11/02/2017 1409   EOSABS 103 11/03/2018 1430   EOSABS 0.1 06/29/2018 0942   BASOSABS 23 11/03/2018 1430   BASOSABS 0.0 06/29/2018 0942   Iron/TIBC/Ferritin/ %Sat No results found for: IRON, TIBC, FERRITIN, IRONPCTSAT Lipid Panel     Component Value Date/Time   CHOL 253 (H) 05/03/2018 1043   TRIG 53 05/03/2018 1043   HDL 86 05/03/2018 1043   CHOLHDL 2.9 05/03/2018 1043   CHOLHDL 3.2 11/26/2016 1618   VLDL 18 11/26/2016 1618   LDLCALC 156 (H) 05/03/2018 1043   Hepatic Function Panel     Component Value Date/Time   PROT 7.0 11/03/2018 1430   PROT 6.7 06/30/2018 1016   ALBUMIN 4.1 06/30/2018 1016   AST 20 11/03/2018 1430   ALT 16 11/03/2018 1430   ALKPHOS 86 06/30/2018 1016   BILITOT 0.3 11/03/2018 1430   BILITOT 0.3 06/30/2018 1016   BILIDIR <0.1 (L) 08/25/2017 0700   IBILI NOT CALCULATED 08/25/2017 0700      Component Value Date/Time   TSH 0.411 (L) 06/30/2018 1016   TSH 0.475 08/25/2017 0700   TSH 0.690 08/08/2015 1345   Results for KANIJA, REMMEL (MRN [id number]) as of 11/08/2018 07:41  Ref. Range 06/30/2018 10:16   Vitamin D, 25-Hydroxy Latest Ref Range: 30.0 - 100.0 ng/mL 36.5   OBESITY BEHAVIORAL INTERVENTION VISIT  Today's visit was #4   Starting weight: 227 lbs Starting date: 06/30/2018 Today's weight: 224 lbs Today's date: 11/07/2018 Total lbs lost to date: 3 At least 15 minutes were spent on discussing the following behavioral intervention visit.    11/07/2018  Height 5\' 7"  (1.702 m)  Weight 224 lb (101.6 kg)  BMI (Calculated) 35.08  BLOOD PRESSURE - SYSTOLIC 106  BLOOD PRESSURE - DIASTOLIC 72   Body Fat % 26.9 %   ASK: We discussed the diagnosis of obesity with [name] today and [name] agreed to give [place] permission to discuss obesity behavioral modification therapy today.  ASSESS: Maridel has the diagnosis of obesity and her BMI today is 35.08. Bettye is in the action stage of change.   ADVISE: Carmesha was educated on the multiple  health risks of obesity as well as the benefit of weight loss to improve her health. She was advised of the need for long term treatment and the importance of lifestyle modifications to improve her current health and to decrease her risk of future health problems.  AGREE: Multiple dietary modification options and treatment options were discussed and  Tynetta agreed to follow the recommendations documented in the above note.  ARRANGE: Dannae was educated on the importance of frequent visits to treat obesity as outlined per CMS and USPSTF guidelines and agreed to schedule her next follow up appointment today.  Migdalia Dk, am acting as transcriptionist for Abby Potash, PA-C I, Abby Potash, PA-C have reviewed above note and agree with its content

## 2018-11-09 ENCOUNTER — Ambulatory Visit (INDEPENDENT_AMBULATORY_CARE_PROVIDER_SITE_OTHER): Payer: Medicare Other | Admitting: Internal Medicine

## 2018-11-09 ENCOUNTER — Encounter: Payer: Medicare Other | Admitting: Internal Medicine

## 2018-11-09 ENCOUNTER — Ambulatory Visit: Payer: Medicare Other | Admitting: Pharmacist

## 2018-11-09 ENCOUNTER — Encounter: Payer: Self-pay | Admitting: Internal Medicine

## 2018-11-09 ENCOUNTER — Other Ambulatory Visit: Payer: Self-pay

## 2018-11-09 ENCOUNTER — Other Ambulatory Visit (HOSPITAL_COMMUNITY): Payer: Self-pay

## 2018-11-09 DIAGNOSIS — B20 Human immunodeficiency virus [HIV] disease: Secondary | ICD-10-CM

## 2018-11-09 DIAGNOSIS — Z6836 Body mass index (BMI) 36.0-36.9, adult: Secondary | ICD-10-CM

## 2018-11-09 DIAGNOSIS — E669 Obesity, unspecified: Secondary | ICD-10-CM

## 2018-11-09 DIAGNOSIS — I129 Hypertensive chronic kidney disease with stage 1 through stage 4 chronic kidney disease, or unspecified chronic kidney disease: Secondary | ICD-10-CM

## 2018-11-09 DIAGNOSIS — N2889 Other specified disorders of kidney and ureter: Secondary | ICD-10-CM

## 2018-11-09 DIAGNOSIS — I739 Peripheral vascular disease, unspecified: Secondary | ICD-10-CM

## 2018-11-09 DIAGNOSIS — Z79899 Other long term (current) drug therapy: Secondary | ICD-10-CM

## 2018-11-09 DIAGNOSIS — N183 Chronic kidney disease, stage 3 unspecified: Secondary | ICD-10-CM

## 2018-11-09 DIAGNOSIS — I77810 Thoracic aortic ectasia: Secondary | ICD-10-CM | POA: Diagnosis not present

## 2018-11-09 DIAGNOSIS — N281 Cyst of kidney, acquired: Secondary | ICD-10-CM

## 2018-11-09 DIAGNOSIS — I10 Essential (primary) hypertension: Secondary | ICD-10-CM

## 2018-11-09 DIAGNOSIS — M7989 Other specified soft tissue disorders: Secondary | ICD-10-CM

## 2018-11-09 NOTE — Assessment & Plan Note (Signed)
Mildly worsened at last check.  She has been taken off of lisinopril due to cough.   Plan Check BMET today for renal function.

## 2018-11-09 NOTE — Assessment & Plan Note (Signed)
Follows with ID.  Her viral load is undetectable and CD4 count is > 600.    Plan Continue [place] and follow up with ID.

## 2018-11-09 NOTE — Assessment & Plan Note (Signed)
BP is better controlled today.  It seems that a simplified regimen (diltiazem and nifedipine only) has been easier for her to follow.  She reports compliance with these 2 medications.   She does have LE edema, which is non pitting.  I did ABI's on her today and there is concern for PAD in both legs.  I advised her not to start using compression stockings because of this.    Nifedipine could certainly be causing her symptoms.  Once her possible PAD is further evaluated, we could consider changing her off of nifedipine to another agent, though this would be challenging given she cannot take ACE-I.    Plan Continue diltiazem and nifedipine today Consider titrating off of nifedipine in future.

## 2018-11-09 NOTE — Assessment & Plan Note (Signed)
She is following with a weight loss clinic and reports being very happy with that group.

## 2018-11-09 NOTE — Progress Notes (Signed)
   Subjective:    Patient ID: Shannon [name], female    DOB: [birth year], 63 y.o.   MRN: [id number]  CC: 2 month follow up for HTN and other chronic issues.   HPI  Shannon [name] is a 63 year old woman with PMH of HIV, CAD, HTN, CKD who presents for follow up.  Shannon [name] reports that she is doing pretty well.  She has had to withdraw from school due to SOB and is still working to get set up with Pulmonary rehab.   We reviewed her MRI results, her aortic aneurysm is stable, recommended 1 year follow up.  We discussed the implications of this and I explained that her aneurysm is not causing her SOB.  I discussed how good BP control is the best management of this issue.   Her BP was improved today.  She is now taking diltiazem and nifedipine daily.  She has stopped lisinopril due to cough.  She is only taking clonidine and lasix PRN.   She has persistent LE edema, history of grade 1 diastolic failure.  She likely has some venous insufficiency. I reviewed her chart and she has never had ABIs, we will do these today in the clinic.  If concerning, will refer to limb ischemia group for further evaluation.    Her Cr was 1.77 a few days ago.  She notes normal urinary function and output.  I will check again today to make sure that it is not rising further.  Plan pending results.    Review of Systems  Constitutional: Positive for activity change and fatigue. Negative for fever.  Respiratory: Positive for cough and shortness of breath. Negative for wheezing.   Cardiovascular: Positive for chest pain (occasional), palpitations and leg swelling.  Genitourinary: Negative for decreased urine volume, difficulty urinating, dysuria, frequency and urgency.  Neurological: Positive for weakness (generalized). Negative for dizziness.       Objective:   Physical Exam Constitutional:      General: She is not in acute distress.    Appearance: Normal appearance. She is not toxic-appearing or diaphoretic.  HENT:   Head: Normocephalic and atraumatic.  Cardiovascular:     Rate and Rhythm: Normal rate and regular rhythm.     Pulses: Normal pulses.     Heart sounds: No murmur.  Pulmonary:     Effort: Pulmonary effort is normal. No respiratory distress.     Comments: Decreased breath sounds, no wheezing Musculoskeletal:        General: Swelling (non pitting) present. No tenderness.     Right lower leg: Edema present.     Left lower leg: Edema present.  Neurological:     General: No focal deficit present.     Mental Status: She is alert. Mental status is at baseline.  Psychiatric:        Mood and Affect: Mood normal.        Behavior: Behavior normal.     BMET today.       Assessment & Plan:  RTC in 2-3 months, sooner if needed.

## 2018-11-09 NOTE — Assessment & Plan Note (Signed)
She will have an MRI of the abdomen to further characterize.    Ordered MR of the abdomen.

## 2018-11-09 NOTE — Assessment & Plan Note (Signed)
Reviewed MRI with the patient.  Her dilation is stable compared to previous.  Recommended 1 year follow up which will be arranged.

## 2018-11-09 NOTE — Patient Instructions (Signed)
Shannon [name]  --   For your legs, I will be referring you to a navigator who will help you get set up with Vascular Surgery.   For your renal cysts, we will plan an MRI of your abdomen.   For your kidneys, we will do some bloodwork today and I will call you with the results.   Thank you!  Come back in 2-3 months.  Please be careful and avoid large groups right now.  Given your asthma, you will be high risk for getting sick from the coronavirus.   Thank you!

## 2018-11-09 NOTE — Assessment & Plan Note (Signed)
Possibilities include vascular insufficiency, medication effect.  She takes lasix PRN.   ABI today was concerning for PAD bilaterally.   Plan Refer to PAD navigator for further work up, will need confirmatory ABI No compression stockings Elevation of legs Continue PRN lasix Consider trial off of nifedipine once further work up for PAD done.

## 2018-11-10 ENCOUNTER — Telehealth: Payer: Self-pay | Admitting: Adult Health

## 2018-11-10 ENCOUNTER — Ambulatory Visit: Payer: Medicare Other | Admitting: Adult Health

## 2018-11-10 ENCOUNTER — Encounter: Payer: Self-pay | Admitting: Internal Medicine

## 2018-11-10 LAB — BMP8+ANION GAP
Anion Gap: 16 mmol/L (ref 10.0–18.0)
BUN/Creatinine Ratio: 16 (ref 12–28)
BUN: 21 mg/dL (ref 8–27)
CALCIUM: 10.3 mg/dL (ref 8.7–10.3)
CO2: 30 mmol/L — ABNORMAL HIGH (ref 20–29)
Chloride: 98 mmol/L (ref 96–106)
Creatinine, Ser: 1.3 mg/dL — ABNORMAL HIGH (ref 0.57–1.00)
GFR calc Af Amer: 50 mL/min/{1.73_m2} — ABNORMAL LOW (ref >59)
GFR calc non Af Amer: 43 mL/min/{1.73_m2} — ABNORMAL LOW (ref >59)
Glucose: 106 mg/dL — ABNORMAL HIGH (ref 65–99)
Potassium: 4.1 mmol/L (ref 3.5–5.2)
Sodium: 144 mmol/L (ref 134–144)

## 2018-11-10 NOTE — Telephone Encounter (Signed)
Called patient x2 phone kept ringing busy.

## 2018-11-10 NOTE — Telephone Encounter (Signed)
Attempted to call patient twice, busy signal no VM set up. X1

## 2018-11-10 NOTE — Telephone Encounter (Signed)
Patient is returning phone call.  Patient phone number is [phone].

## 2018-11-11 DIAGNOSIS — R269 Unspecified abnormalities of gait and mobility: Secondary | ICD-10-CM | POA: Diagnosis not present

## 2018-11-11 DIAGNOSIS — J449 Chronic obstructive pulmonary disease, unspecified: Secondary | ICD-10-CM | POA: Diagnosis not present

## 2018-11-11 DIAGNOSIS — G4733 Obstructive sleep apnea (adult) (pediatric): Secondary | ICD-10-CM | POA: Diagnosis not present

## 2018-11-11 DIAGNOSIS — J45909 Unspecified asthma, uncomplicated: Secondary | ICD-10-CM | POA: Diagnosis not present

## 2018-11-11 NOTE — Telephone Encounter (Signed)
Attempted to call pt x2 but each time I tried to call pt line was busy. Will try to call back later.

## 2018-11-11 NOTE — Telephone Encounter (Signed)
I am glad to call her , but MRI should be gone over by ordering provider  She is high risk for potential issues so if can keep out of medical places the better so telemedicine would be best for non emergent issues .   Please contact office for sooner follow up if symptoms do not improve or worsen or seek emergency care

## 2018-11-11 NOTE — Telephone Encounter (Signed)
MRI was ordered by pt's PCP Dr. Gilles Chiquito.    Called and spoke with pt and stated to her that Dr. Doristine Section office is the one who had scheduled the MRI and her office is the one that she will need to call to receive those results. Pt stated to me that she had received the results of the MRI from Dr. Daryll Drown but that was not the reason why she was calling.  The reason why she was calling the office was due to wanting to schedule an appt with TP to further discuss the MRI and some findings that were seen. I stated to pt that we could schedule a televisit for TP to call her to discuss the results with her but pt stated that she did not want a televisit.  Pt stated she wanted to schedule a visit next week to come to the office to either see TP or RA in person and DID NOT want to do a televisit and insisted to come into the office for a face-to-face visit in regards to findings.  I stated to pt that follow-up visits are being pushed back until June but pt then stated to me that this should not be considered a routine follow-up visit and that is why she wants to come in for a face-to-face visit next week either with RA or TP.  Tammy, please advise on this for pt. Thanks!

## 2018-11-14 NOTE — Telephone Encounter (Signed)
Attempted to call patient today regarding results. I did not receive an answer at time of call. I have left a voicemail message for pt to return call. X1  

## 2018-11-15 ENCOUNTER — Encounter (INDEPENDENT_AMBULATORY_CARE_PROVIDER_SITE_OTHER): Payer: Self-pay

## 2018-11-15 NOTE — Telephone Encounter (Signed)
Attempted to call pt but unable to reach and unable to leave a VM. Will try to call back later.

## 2018-11-16 NOTE — Telephone Encounter (Signed)
Closing per protocol 

## 2018-11-18 ENCOUNTER — Telehealth: Payer: Self-pay | Admitting: Internal Medicine

## 2018-11-18 NOTE — Telephone Encounter (Signed)
Pt's left leg swelling worse since last visit.  Pt wants to know when her Vascular appointment will be sch due to leg now becoming numb. Pt wants to know what to do.

## 2018-11-19 NOTE — Telephone Encounter (Signed)
I do not know about the appointment.  She is in the queue.  She should come back in if things are worsening.  I would make her an [facility] appointment.  Swelling was not significant at last visit.

## 2018-11-21 NOTE — Telephone Encounter (Signed)
Returned call to patient. Phone rang many times with no answer and no VM picking up. Hubbard Hartshorn, RN, BSN

## 2018-11-21 NOTE — Progress Notes (Signed)
So appreciate your care!

## 2018-11-22 ENCOUNTER — Other Ambulatory Visit: Payer: Self-pay

## 2018-11-22 ENCOUNTER — Telehealth (INDEPENDENT_AMBULATORY_CARE_PROVIDER_SITE_OTHER): Payer: Medicare Other | Admitting: Cardiovascular Disease

## 2018-11-22 ENCOUNTER — Encounter: Payer: Self-pay | Admitting: *Deleted

## 2018-11-22 VITALS — BP 152/87 | HR 100 | Ht 64.0 in | Wt 222.4 lb

## 2018-11-22 DIAGNOSIS — I7121 Aneurysm of the ascending aorta, without rupture: Secondary | ICD-10-CM

## 2018-11-22 DIAGNOSIS — E7800 Pure hypercholesterolemia, unspecified: Secondary | ICD-10-CM

## 2018-11-22 DIAGNOSIS — I712 Thoracic aortic aneurysm, without rupture: Secondary | ICD-10-CM

## 2018-11-22 DIAGNOSIS — N183 Chronic kidney disease, stage 3 unspecified: Secondary | ICD-10-CM

## 2018-11-22 DIAGNOSIS — I739 Peripheral vascular disease, unspecified: Secondary | ICD-10-CM | POA: Diagnosis not present

## 2018-11-22 DIAGNOSIS — I10 Essential (primary) hypertension: Secondary | ICD-10-CM

## 2018-11-22 DIAGNOSIS — I2510 Atherosclerotic heart disease of native coronary artery without angina pectoris: Secondary | ICD-10-CM | POA: Diagnosis not present

## 2018-11-22 DIAGNOSIS — Z7901 Long term (current) use of anticoagulants: Secondary | ICD-10-CM

## 2018-11-22 DIAGNOSIS — I4892 Unspecified atrial flutter: Secondary | ICD-10-CM | POA: Diagnosis not present

## 2018-11-22 MED ORDER — CLONIDINE HCL 0.1 MG PO TABS: 0.1000 mg | ORAL_TABLET | ORAL | 5 refills | Status: DC

## 2018-11-22 NOTE — Progress Notes (Signed)
Virtual Visit via Video Note    Evaluation Performed:  Follow-up visit  This visit type was conducted due to national recommendations for restrictions regarding the COVID-19 Pandemic (e.g. social distancing).  This format is felt to be most appropriate for this patient at this time.  All issues noted in this document were discussed and addressed.  No physical exam was performed (except for noted visual exam findings with Video Visits).  Please refer to the patient's chart (MyChart message for video visits and phone note for telephone visits) for the patient's consent to telehealth for Shannon [name].  Date:  11/22/2018   ID:  Shannon [name], DOB 1955-11-03, MRN [id number]  Patient Location:  Home  Provider location:   Office  PCP:  Shannon Falcon, MD  Cardiologist:  Shannon Latch, MD  Electrophysiologist:  None   Chief Complaint:  hypertension  History of Present Illness:    Shannon [name] is a 63 y.o. female who presents via video conferencing for a telehealth visit today.    Shannon [name] is a 63 y.o. female with hypertension, paroxysmal atrial flutter, asthma, non-obstructive CAD, OSA, prior PE, moderate pulmonary hypertension, mild ascending aorta aneurysm, and HIV (well-controlled)  who presents for follow up.  Shannon [name] was initially referred by Shannon Basques, MD, on 12/26/15. Dr. Baxter [name] was concerned about her lower extremity edema and shortness of breath.  She was started on lasix 20 mg every other day.  Shannon [name] noted shortness of breath after a hospitalization for pneumonia 06/2015.   She denied lower extremity edema or orthopnea but did report chest pain when laying down at night.  She was referred for an echo 01/2016 that revealed LVEF 60-65% awith grade 1 diastolic dysfunction and hypokinesis of the basal inferior wall.  She also had very mild aortic stenosis with a mean gradient of 9 mmHg and trivial AR.  PASP was 52 mmHg.  She had a Lexiscan Myoview at that time that was  negative for ischemia.  Shannon [name] previously had a Lexiscan Cardiolite in September 2013 that revealed a medium, mild reversible defect in the anterior wall and normal systolic function. She subsequently underwent cardiac catheterization that revealed nonobstructive coronary disease. She had mild pulmonary hypertension and a normal cardiac output. Left ventriculography revealed mild to moderate aortic regurgitation.  She had a 24-hour Holter in February 2015 that revealed rare PACs and PVCs.  Echo 01/06/17 revealed LVEF 60-65% with mild LVH and a mean aortic valve gradient of 8 mmHg.  Tricuspid regurgitation was not significant enough to assess pulmonary pressures  Shannon [name] was admitted to the hospital 01/25/17 with hypertensive emergency and chest pain. Her blood pressure was 215/100.  Her blood pressure in the ED was 185/119. She was again seen in the ED 01/28/17 with shortness of breath and wheezing. Her blood pressure at that time was 190/120. It was noted that she hadn't been taking her medication as prescribed.  She has stopped taking losartan because her blood pressure gets too low and she feels poorly.  She notes that her blood pressure is elevated when she is in pain.  She has a lot of pain from her neck and back.  She was also in the ED 08/25/17 for a URI and on 08/18/17 for R sided chest and neck pain.  Her BP was 172/97 at the time.   Shannon [name] saw Shannon [name], Shannon [name] on 12/2017 and her BP was poorly controlled.  She wasn't taking lasix due to  fear it would dehydrate her.  Shannon [name] suggested switching to a different ARB or starting a daily diuretic but she declined.  She was not taking losartan 2/2 recall so she was started on lisinopril. Since then she was admitted for an asthma exacerbation 03/2018.  At that time her BP was poorly controlled.  Her beta blocker was stopped and her BP was controlled on her other home antihypertensives.  Since then she followed up with her PCP 04/26/18 for headache and her BP was  193/112.  She had continued to take carvedilol and this was discontinued at her clinic.  However they restarted her nifedipine.   Shannon [name] has been followed in the obesity medicine clinic and has received a lot of help there.  She saw Shannon [name], Shannon [name] on 1/3 and after a call to her pharmacy it was noted that she has not taking diltiazem. Since her last appointment lisinopril was stopped due to cough and throat swelling.  Her cough has improved.  She notes that her BP has been running high at home.  She takes clonidine when her BP is ?160 and this works well.  She also had LE edema but has changed her diet to limit meat and sugar.  Her swelling has improved, which she attributed to her dietary changes.  She lost 7lb in 2 weeks.  Shannon [name] had ABIs performed by her PCP, Shannon [name]. The full results are not available, but it was reportedly abnormal.  She reports discomfort in her legs both at rest and with walking.  She had some lower extremity edema that improved with drinking water.  BNP was 26 on 11/03/18.  She has no orthopnea or PND.  The patient does not have symptoms concerning for COVID-19 infection (fever, chills, cough, or new shortness of breath).    Prior CV studies:   The following studies were reviewed today:  Echo 01/06/17: Study Conclusions  - Left ventricle: The cavity size was normal. Wall thickness was   increased in a pattern of mild LVH. Systolic function was normal.   The estimated ejection fraction was in the range of 60% to 65%.   Wall motion was normal; there were no regional wall motion   abnormalities. Doppler parameters are consistent with abnormal   left ventricular relaxation (grade 1 diastolic dysfunction). - Aortic valve: Trileaflet; moderately calcified leaflets.   Sclerosis without stenosis. There was mild to moderate   regurgitation. Mean gradient (S): 8 mm Hg. - Mitral valve: There was no significant regurgitation. - Left atrium: The atrium was moderately  dilated. - Right ventricle: The cavity size was normal. Systolic function   was normal. - Right atrium: The atrium was mildly dilated. - Pulmonary arteries: No complete TR doppler jet so unable to   estimate PA systolic pressure. - Systemic veins: IVC measured 2.2 cm with > 50% respirophasic   variation, suggesting RA pressure 8 mmHg.  Impressions:  - Normal LV size with mild LV hypertrophy. EF 60-65%. Calcified,   trileaflet aortic valve. Sclerosis without significant stenosis,   mild to moderate aortic insufficiency. Normal RV size and   systolic function. Biatrial enlargement.  Past Medical History:  Diagnosis Date   Anemia    Anxiety    HX PANIC ATTACKS   Arthritis    "starting to; in my hands" (07/09/2015)   Asthma    Atrial fibrillation (HCC)    Atrial flutter, paroxysmal (HCC)    Bloated abdomen    CFS (chronic fatigue syndrome)  Chewing difficulty    Chronic asthma with acute exacerbation    "I have chronic asthma all the time; sometimes exacerbations" (07/09/2015)   Chronic lower back pain    COPD (chronic obstructive pulmonary disease) ([place])    Cyst of right kidney    "3 of them; dx'd in ~ 01/2015"   Dyspnea    GERD (gastroesophageal reflux disease)    Heart murmur    History of blood transfusion    "related to my brain surgery I think"   History of pulmonary embolism 07/09/2015   HIV antibody positive ([place])    HIV disease ([place])    Hyperlipidemia    Hypertension    Leg edema    Lipodystrophy    Mild CAD 2013   Palpitations    Pneumonia 07/09/2015   Shingles    Sleep apnea    "never completed part 2 of study; never wore mask" (07/09/2015)   Vitamin B 12 deficiency    Vitamin D deficiency    Past Surgical History:  Procedure Laterality Date   ABDOMINAL HYSTERECTOMY     "robotic laparosopic"   [place]   "brain tumor; benign; on top of my brain; got a plate in there"   CARDIAC CATHETERIZATION      TONSILLECTOMY AND ADENOIDECTOMY       Current Meds  Medication Sig   acetaminophen (TYLENOL) 325 MG tablet Take 325 mg by mouth every 6 (six) hours as needed (FOR PAIN).   albuterol (PROAIR HFA) 108 (90 Base) MCG/ACT inhaler INHALE 2 PUFFS INTO THE LUNGS EVERY 4 HOURS AS NEEDED FOR WHEEZING OR SHORTNESS OF BREATH   albuterol (PROVENTIL) (2.5 MG/3ML) 0.083% nebulizer solution USE 1 VIAL VIA NEBULIZER EVERY 6 HOURS AS NEEDED FOR WHEEZING OR SHORTNESS OF BREATH   Ascorbic Acid (VITAMIN C) 1000 MG tablet Take 1,000 mg by mouth 2 (two) times a week.    azelastine (OPTIVAR) 0.05 % ophthalmic solution Place 2 drops into both eyes 2 (two) times daily as needed (for swelling or itching).    BIKTARVY 50-200-25 MG TABS tablet TAKE 1 TABLET BY MOUTH DAILY (Patient taking differently: 1 tablet daily. )   BIOTIN PO Take 1 tablet by mouth daily as needed (for supplementation).    Cholecalciferol (SM VITAMIN D3) 100 MCG (4000 UT) CAPS Take 1 capsule (4,000 Units total) by mouth daily.   cloNIDine (CATAPRES) 0.1 MG tablet TAKE 1 TABLET TWICE A DAY AS NEEDED FOR SYSTOLIC BLOOD PRESSURE (TOP NUMBER) GREATER THAN 160   cyanocobalamin (,VITAMIN B-12,) 1000 MCG/ML injection ADMINISTER 1 ML(1000 MCG) IN THE MUSCLE EVERY 30 DAYS   diltiazem (CARDIZEM CD) 240 MG 24 hr capsule Take 1 capsule (240 mg total) by mouth daily.   fluticasone furoate-vilanterol (BREO ELLIPTA) 200-25 MCG/INH AEPB Inhale 1 puff into the lungs daily.   furosemide (LASIX) 40 MG tablet Take 1 tablet (40 mg total) by mouth daily.   furosemide (LASIX) 40 MG tablet Take 40 mg by mouth daily as needed.   LORazepam (ATIVAN) 0.5 MG tablet Take 1 tablet (0.5 mg total) by mouth 3 (three) times daily as needed for anxiety.   MAGNESIUM PO Take by mouth every other day.   Melatonin 5 MG TABS Take 5 mg by mouth at bedtime as needed (for sleep).   Mouthwashes (BIOTENE DRY MOUTH GENTLE) LIQD Use as directed 1 Dose in the mouth or throat 2 (two)  times daily.   NIFEdipine (ADALAT CC) 90 MG 24 hr tablet Take 1 tablet (  90 mg total) by mouth daily.   omeprazole (PRILOSEC) 40 MG capsule Take 1 capsule (40 mg total) by mouth daily.   OXYGEN Inhale 1.5-2 L into the lungs continuous.    polyethylene glycol (MIRALAX / GLYCOLAX) packet Take 17 g by mouth daily.   potassium chloride SA (K-DUR,KLOR-CON) 20 MEQ tablet Take 20 mEq by mouth every other day.   PREVIDENT 5000 SENSITIVE 1.1-5 % PSTE Use daily as directed   senna-docusate (SENOKOT-S) 8.6-50 MG tablet Take 1 tablet by mouth at bedtime as needed for mild constipation.   sodium chloride (OCEAN) 0.65 % SOLN nasal spray Place 1 spray into both nostrils as needed for congestion.   Tetrahydrozoline HCl (VISINE OP) Place 2 drops into both eyes daily as needed (for dry eyes).    XARELTO 20 MG TABS tablet TAKE 1 TABLET(20 MG) BY MOUTH DAILY WITH SUPPER   [DISCONTINUED] loratadine (CLARITIN) 10 MG tablet Take 1 tablet (10 mg total) by mouth daily.   [DISCONTINUED] potassium chloride SA (K-DUR,KLOR-CON) 20 MEQ tablet Take 1 tablet (20 mEq total) by mouth daily.     Allergies:   Tree extract; Lisinopril; and Ciprofloxacin   Social History   Tobacco Use   Smoking status: Never Smoker   Smokeless tobacco: Never Used  Substance Use Topics   Alcohol use: Yes    Alcohol/week: 0.0 standard drinks    Comment: Rarely.   Drug use: No     Family Hx: The patient's family history includes Asthma in her mother; Diabetes in her sister; Heart disease in her mother; Heart failure in her mother; Heart murmur in her brother, sister, and sister; Obesity in her mother; Thyroid disease in her mother and sister.  ROS:   Please see the history of present illness.     All other systems reviewed and are negative.   Labs/Other Tests and Data Reviewed:    Recent Labs: 06/30/2018: TSH 0.411 11/03/2018: ALT 16; Brain Natriuretic Peptide 26; Hemoglobin 12.8; Platelets 196 11/09/2018: BUN 21;  Creatinine, Ser 1.30; Potassium 4.1; Sodium 144   Recent Lipid Panel Lab Results  Component Value Date/Time   CHOL 253 (H) 05/03/2018 10:43 AM   TRIG 53 05/03/2018 10:43 AM   HDL 86 05/03/2018 10:43 AM   CHOLHDL 2.9 05/03/2018 10:43 AM   CHOLHDL 3.2 11/26/2016 04:18 PM   LDLCALC 156 (H) 05/03/2018 10:43 AM    Wt Readings from Last 3 Encounters:  11/22/18 222 lb 6.4 oz (100.9 kg)  11/09/18 229 lb 14.4 oz (104.3 kg)  11/07/18 224 lb (101.6 kg)     Objective:    BP (!) 152/87    Pulse 100    Ht 5\' 4"  (1.626 m)    Wt 222 lb 6.4 oz (100.9 kg)    BMI 38.17 kg/m  GENERAL: Well-appearing.  No acute distress. HEENT: Pupils equal round.  Oral mucosa unremarkable NECK:  No jugular venous distention, no visible thyromegaly EXT:  No edema, no cyanosis no clubbing SKIN:  No rashes no nodules NEURO:  Speech fluent.  Cranial nerves grossly intact.  Moves all 4 extremities freely PSYCH:  Cognitively intact, oriented to person place and time   ASSESSMENT & PLAN:    # Atrial flutter: Converted with flecainide.  Continue diltiazem and Xarelto.   # Hypertension:  BP is above goal since stopping lisinopril.  She also reports LE edema with a normal BNP.  This may be due to nifedipine.  We will stop nifedipine.  Start clonidine 0.1mg  daily.  I  suggested bid but she wants to try daily first.  Continue diltiazem and lasix.   # Aortic regurgitation # Aortic stenosis: Ms. Crock has very mild aortic stenosis (mean gradient 9 mmHg) and mild aortic regurgitation.    # Hyperlipidemia: Lipids are elevated.  She is working on diet and would like to continue this before starting a statin.  We will check lipids again in 3 months and reassess.  # Ascending aorta aneurysm: 4.0 on CT 12/2016.  It was unchanged on MRA 10/2018.  Continue annual imaging for now.   # Abnormal ABI: Full results are not available.  Check ABI/Dopplers and refer to PV as needed.  COVID-19 Education: The signs and symptoms of  COVID-19 were discussed with the patient and how to seek care for testing (follow up with PCP or arrange E-visit).  The importance of social distancing was discussed today.  Patient Risk:   After full review of this patient's clinical status, I feel that they are at least moderate risk at this time.  Time:   Today, I have spent 26 minutes with the patient with telehealth technology discussing hypertension, aortic aneurysm, stress.     Medication Adjustments/Labs and Tests Ordered: Current medicines are reviewed at length with the patient today.  Concerns regarding medicines are outlined above.   Tests Ordered: No orders of the defined types were placed in this encounter.  Medication Changes: No orders of the defined types were placed in this encounter.   Disposition:  Follow up in 1 month.  Signed, Shannon Latch, MD  11/22/2018 4:02 PM    [facility]

## 2018-11-22 NOTE — Patient Instructions (Addendum)
Medication Instructions:  STOP NIFEDIPINE   START TAKING YOUR CLONIDINE EVERY MORNING AND OK TO TAKE IN THE EVENING IF YOUR TOP NUMBER ON YOUR BLOOD PRESSURE IS ABOVE 150  If you need a refill on your cardiac medications before your next appointment, please call your pharmacy.   Lab work: NONE  Testing/Procedures: Your physician has requested that you have an echocardiogram. Echocardiography is a painless test that uses sound waves to create images of your heart. It provides your doctor with information about the size and shape of your heart and how well your heart's chambers and valves are working. This procedure takes approximately one hour. There are no restrictions for this procedure. [place] [address] TO BE DONE IN MARCH 2021  Your physician has requested that you have an ankle brachial index (ABI). During this test an ultrasound and blood pressure cuff are used to evaluate the arteries that supply the arms and legs with blood. Allow thirty minutes for this exam. There are no restrictions or special instructions. THE OFFICE WILL CALL YOU AND SCHEDULE AFTER COVID 19 RESTRICTIONS LIFTED  Follow-Up: 1 MONTH VIDEO VISIT

## 2018-11-23 ENCOUNTER — Encounter: Payer: Medicare Other | Admitting: Internal Medicine

## 2018-11-24 ENCOUNTER — Telehealth: Payer: Self-pay | Admitting: *Deleted

## 2018-11-24 NOTE — Telephone Encounter (Signed)
-----   Message from Roland Earl sent at 11/24/2018 10:59 AM EDT ----- I spoke with this patient she did not want to schedule her Echo  at this time  ----- Message ----- From: Earvin Hansen, LPN Sent: [id number]   5:09 PM EDT To: Earvin Hansen, LPN, Cv Div [facility] Scheduling  HELLO LADIES Patient need Echo in 1 year and ABI's when COVID restrictions lifted Thanks Heidi Maclin

## 2018-11-24 NOTE — Progress Notes (Signed)
Patient had video visit 3/31

## 2018-11-25 NOTE — Telephone Encounter (Signed)
Pt stated she's feeling better; elevating her legs - waiting on vascular referral appt. I will f/u with Lela S, referral coordinator.

## 2018-11-28 ENCOUNTER — Ambulatory Visit (INDEPENDENT_AMBULATORY_CARE_PROVIDER_SITE_OTHER): Payer: Self-pay | Admitting: Family Medicine

## 2018-11-28 ENCOUNTER — Telehealth: Payer: Self-pay | Admitting: *Deleted

## 2018-11-28 ENCOUNTER — Ambulatory Visit (INDEPENDENT_AMBULATORY_CARE_PROVIDER_SITE_OTHER): Payer: Self-pay | Admitting: Psychology

## 2018-11-28 NOTE — Telephone Encounter (Signed)
CALLED PATIENT , UNABLE TO LEAVE MESSAGE /  CALL WAS REGARDING HER VVS APPOINTMENT. OFFICE TRYING TO SCHEDULE WITH 4 WEEKS / COVID IS DELAYING APPOINTMENT / SHOULD HEAR SOMETHING ABOUT MID April REGARDING AN APPOINTMENT.

## 2018-11-28 NOTE — Telephone Encounter (Signed)
PATIENT CALLED BACK / PATIENT WAS INFORMED ABOUT THE SCHEDULING  FOR VVS DUE TO COVID/ PATIENT STATES MID April OR END OF April IS FINE WITH HER. SHE WANTS EVERYBODY TO BE SAFE.  PATIENT ALSO STATED SHE HAS A VERY BAD SINUS INFECTION WITH GREENISH COLOR "STUFF:.  PATIENT WAS INSTRUCTED TO CALL THE CLINIC TO TALK WITH TRIAGE NURSE.

## 2018-11-29 ENCOUNTER — Other Ambulatory Visit: Payer: Self-pay | Admitting: *Deleted

## 2018-11-29 DIAGNOSIS — I483 Typical atrial flutter: Secondary | ICD-10-CM

## 2018-11-29 MED ORDER — RIVAROXABAN 20 MG PO TABS: 20.0000 mg | ORAL_TABLET | Freq: Every day | ORAL | 1 refills | Status: DC

## 2018-11-30 ENCOUNTER — Telehealth: Payer: Self-pay | Admitting: Infectious Diseases

## 2018-11-30 ENCOUNTER — Telehealth: Payer: Self-pay

## 2018-11-30 NOTE — Telephone Encounter (Signed)
COVID-19 Pre-Screening Questions: ° °Do you currently have a fever (>100 °F), chills or unexplained body aches? No  ° °Are you currently experiencing new cough, shortness of breath, sore throat, runny nose? No  °•  °Have you recently travelled outside the state of [place] in the last 14 days? No  °•  °Have you been in contact with someone that is currently pending confirmation of Covid19 testing or has been confirmed to have the Covid19 virus?  No  °

## 2018-11-30 NOTE — Telephone Encounter (Signed)
She apparently has a visit with me tomorrow.  We can discuss further then but I would focus her attention on treating her allergies --  It does not sound like this is being addressed enough to help her symptoms and an antibiotic is not likely to help.  I do not see where she is taking any antihistamines.  I would encourage her to start taking Zyrtec once a day.  Would also encourage her to use a Nettie pot to help remove allergens from her sinus cavities.  Would also have her start using Flonase 2 sprays to both nostrils once a day.  This medicine takes some time to work so please encourage her to continue to give this at least a week of use.  Her blood pressure is too high to have her use anything like Sudafed.  So please tell her not to take Sudafed.

## 2018-11-30 NOTE — Telephone Encounter (Signed)
Patient states she has pressure between her eyes with headaches. Patient this happens during this time of year with her history of allergies. Patient requests z-pak to be called in to [facility] on [address]. Routing to NP for advise.  Eugenia Mcalpine, LPN

## 2018-12-01 ENCOUNTER — Ambulatory Visit (INDEPENDENT_AMBULATORY_CARE_PROVIDER_SITE_OTHER): Payer: Medicare Other | Admitting: Infectious Diseases

## 2018-12-01 ENCOUNTER — Other Ambulatory Visit: Payer: Self-pay

## 2018-12-01 ENCOUNTER — Encounter: Payer: Self-pay | Admitting: Infectious Diseases

## 2018-12-01 DIAGNOSIS — B9689 Other specified bacterial agents as the cause of diseases classified elsewhere: Secondary | ICD-10-CM | POA: Insufficient documentation

## 2018-12-01 DIAGNOSIS — J309 Allergic rhinitis, unspecified: Secondary | ICD-10-CM

## 2018-12-01 DIAGNOSIS — J019 Acute sinusitis, unspecified: Secondary | ICD-10-CM

## 2018-12-01 MED ORDER — AMOXICILLIN 500 MG PO CAPS: 500.0000 mg | ORAL_CAPSULE | Freq: Three times a day (TID) | ORAL | 0 refills | Status: DC

## 2018-12-01 MED ORDER — FLUTICASONE PROPIONATE 50 MCG/ACT NA SUSP: 2.0000 | Freq: Every day | NASAL | 2 refills | Status: DC

## 2018-12-01 MED ORDER — MONTELUKAST SODIUM 10 MG PO TABS: 10.0000 mg | ORAL_TABLET | Freq: Every day | ORAL | 2 refills | Status: DC

## 2018-12-01 NOTE — Patient Instructions (Addendum)
For your sinus problems:   1.  Start cetirizine once a day (I sent in a prescription)  2.  Start flonase nasal spray   3.  I want you to start taking the Singulair (prescription allergy medicine) every evening.  4.  Humidifiers in the house and to humidify her oxygen would be helpful not to dry her nose out.  5.  Nettie pot is helpful you can look on YouTube to see how this works.  Ocean nasal spray is also an option but less effective.     We will also treat with amoxicillin (antibiotic) since your symptoms have progressed severely in case there is a bacterial process going on.    I fear that if we do not treat the allergies then this will not get better for you and likely to recur.  Please send a my chart message with an update early next week to let us know how you are doing.

## 2018-12-01 NOTE — Progress Notes (Signed)
Name: MCKENNAH KRETCHMER  FBP:[id number]   DOB: [birth year]   PCP: [name], MD    Virtual Visit via Telephone Note  I connected with [name] on 12/01/18 at  9:45 AM EDT by telephone and verified that I am speaking with the correct person using two identifiers.   I discussed the limitations, risks, security and privacy concerns of performing an evaluation and management service by telephone and the availability of in person appointments. I also discussed with the patient that there may be a patient responsible charge related to this service. The patient expressed understanding and agreed to proceed.   Chief Complaint  Patient presents with  . Follow-up    sinusitis, head ache      History of Present Illness: Brizeida has been feeling poorly since Sunday.  She describes that she has had a sinus headache that has been progressively getting worse since that time.  She denies any fevers but has chills, malaise, decreased taste.  She has been taking Mucinex and does not feel like it is helping.  She took her vital signs this morning - HR 84, Oxygen 93%, Temp 97.8, BP 170/80.  She tells me that her swelling in her legs has improved.  She wonders if her cough and shortness of breath has to do with her thoracic aortic aneurysm.  Tells me that she has been told that she is allergic to trees and pollen in the past but is not currently maintained on any daily allergy medications.  She is trying to stay inside and away from the outdoors with all the pollen but has been going out for the necessary grocery trips and errands.  She tells me that she has had many sinus infections in the past and antibiotics can sometimes help.  She feels like this is 1 of those times that she needs an antibiotic to feel better.   Medical/surgical/social/family history have been updated during today's visit.    She spent a lot of time discussing her previous medical history with me that we spoke about several months  back.  She tells me that she has seen her cardiologist and her blood pressure has been better up until current illness.  Tells me she is not taking any decongestants.  Observations/Objective: Tzivia sounds congested over the phone.  She is coughing frequently during our discussion.  HIV 1 RNA Quant (copies/mL)  Date Value  11/03/2018 <20 NOT DETECTED  02/01/2018 <20 NOT DETECTED  12/13/2017 47 (H)   CD4 T Cell Abs (/uL)  Date Value  11/03/2018 640  12/13/2017 670  09/02/2017 520    Lab Results  Component Value Date   CREATININE 1.30 (H) 11/09/2018   CREATININE 1.77 (H) 11/03/2018   CREATININE 1.33 (H) 10/28/2018    Lab Results  Component Value Date   WBC 5.7 11/03/2018   HGB 12.8 11/03/2018   HCT 38.1 11/03/2018   MCV 91.6 11/03/2018   PLT 196 11/03/2018    Lab Results  Component Value Date   ALT 16 11/03/2018   AST 20 11/03/2018   ALKPHOS 86 06/30/2018   BILITOT 0.3 11/03/2018     Assessment and Plan: Problem List Items Addressed This Visit      Unprioritized   Acute bacterial sinusitis - Primary    Her symptom progression seems to be rapid, severe and suggestive of possible bacterial superinfection.  I reiterated to her on the phone that I think it is very important she take my advice  on treating her allergies to prevent this from recurring in the future.  Will prescribe amoxicillin to treat for 10 days.      Relevant Medications   fluticasone (FLONASE) 50 MCG/ACT nasal spray   amoxicillin (AMOXIL) 500 MG capsule   Allergic sinusitis    We discussed an allergy plan today.  She will start taking Zyrtec once a day.  I will send her in a prescription for Singulair to be taken in the evening once a day.  I have asked her to also start on Flonase nasal spray, consider Nettie pot/Ocean Spray saline.  I told her that her symptoms will likely return if we do not treat the underlying problem.  I explained the difference between bacterial/viral/allergic sinusitis.  She  will send MyChart with an update early next week as to how she is doing.      Relevant Medications   fluticasone (FLONASE) 50 MCG/ACT nasal spray   amoxicillin (AMOXIL) 500 MG capsule        Follow Up Instructions: As needed if symptoms do not improve.  She should follow-up again with Dr. Graylon Good in about 3 months for routine HIV care.  I have scheduled appointment for her as one was not on file.   I discussed the assessment and treatment plan with the patient. The patient was provided an opportunity to ask questions and all were answered. The patient agreed with the plan and demonstrated an understanding of the instructions.   The patient was advised to call back or seek an in-person evaluation if the symptoms worsen or if the condition fails to improve as anticipated.  I provided 20 minutes of non-face-to-face time during this encounter.   Janene Madeira, MSN, NP-C [facility] [email or web address] Pager: [phone] Office: [phone] [place]: [phone]

## 2018-12-01 NOTE — Assessment & Plan Note (Signed)
We discussed an allergy plan today.  She will start taking Zyrtec once a day.  I will send her in a prescription for Singulair to be taken in the evening once a day.  I have asked her to also start on Flonase nasal spray, consider Nettie pot/Ocean Spray saline.  I told her that her symptoms will likely return if we do not treat the underlying problem.  I explained the difference between bacterial/viral/allergic sinusitis.  She will send MyChart with an update early next week as to how she is doing.

## 2018-12-01 NOTE — Assessment & Plan Note (Addendum)
Her symptom progression seems to be rapid, severe and suggestive of possible bacterial superinfection.  I reiterated to her on the phone that I think it is very important she take my advice on treating her allergies to prevent this from recurring in the future.  Will prescribe amoxicillin to treat for 10 days.

## 2018-12-05 ENCOUNTER — Ambulatory Visit (INDEPENDENT_AMBULATORY_CARE_PROVIDER_SITE_OTHER): Payer: Self-pay | Admitting: Family Medicine

## 2018-12-05 ENCOUNTER — Ambulatory Visit (INDEPENDENT_AMBULATORY_CARE_PROVIDER_SITE_OTHER): Payer: Medicare Other | Admitting: Psychology

## 2018-12-06 ENCOUNTER — Ambulatory Visit: Payer: Medicare Other | Admitting: Infectious Diseases

## 2018-12-12 DIAGNOSIS — J449 Chronic obstructive pulmonary disease, unspecified: Secondary | ICD-10-CM | POA: Diagnosis not present

## 2018-12-12 DIAGNOSIS — J45909 Unspecified asthma, uncomplicated: Secondary | ICD-10-CM | POA: Diagnosis not present

## 2018-12-12 DIAGNOSIS — R269 Unspecified abnormalities of gait and mobility: Secondary | ICD-10-CM | POA: Diagnosis not present

## 2018-12-12 DIAGNOSIS — G4733 Obstructive sleep apnea (adult) (pediatric): Secondary | ICD-10-CM | POA: Diagnosis not present

## 2018-12-14 ENCOUNTER — Other Ambulatory Visit: Payer: Self-pay

## 2018-12-14 ENCOUNTER — Ambulatory Visit (INDEPENDENT_AMBULATORY_CARE_PROVIDER_SITE_OTHER): Payer: Medicare Other | Admitting: Physician Assistant

## 2018-12-14 ENCOUNTER — Telehealth (INDEPENDENT_AMBULATORY_CARE_PROVIDER_SITE_OTHER): Payer: Self-pay | Admitting: Psychology

## 2018-12-14 ENCOUNTER — Ambulatory Visit (INDEPENDENT_AMBULATORY_CARE_PROVIDER_SITE_OTHER): Payer: Self-pay | Admitting: Psychology

## 2018-12-14 ENCOUNTER — Encounter (INDEPENDENT_AMBULATORY_CARE_PROVIDER_SITE_OTHER): Payer: Self-pay | Admitting: Physician Assistant

## 2018-12-14 DIAGNOSIS — E559 Vitamin D deficiency, unspecified: Secondary | ICD-10-CM

## 2018-12-14 DIAGNOSIS — Z6835 Body mass index (BMI) 35.0-35.9, adult: Secondary | ICD-10-CM | POA: Diagnosis not present

## 2018-12-14 NOTE — Telephone Encounter (Signed)
  Office: [phone]  /  Fax: [phone]  Date of Call: December 14, 2018 Time of Call: 11:03am Duration of Call: 6 minutes Provider: Glennie Isle, PsyD  CONTENT: This provider called Shannon [name], as she did not present for her 11:00am WebEx appointment. Shannon [name] explained, "I didn't want to disappointment you, but I haven't been well." She shared about ongoing physical concerns, and noted she was experiencing shortness of breath while on the phone with this provider. As such, this provider recommended she reach out to her PCP. Shannon [name] agreed and also noted she has an oxygen tank, but she was taking a break from it while on the phone. A brief risk assessment was completed. Since the last appointment with this provider, Shannon [name] denied experiencing suicidal and homicidal ideation, plan, and intent.   Today's appointment was rescheduled and this provider explained a new e-mail would be sent to [facility] with a secure link for the new WebEx appointment. This provider also explained the e-mail will include a consent document for telepsychological services. Shannon [name] acknowledged understanding that sending the completed consent via MyChart will be visible to all providers, as it would be part of her medical record.   PLAN: Today's appointment was rescheduled to Monday, December 19, 2018 at 11:30am.

## 2018-12-14 NOTE — Progress Notes (Signed)
Office: [phone]  /  Fax: [phone] TeleHealth Visit:  Shannon [name] has verbally consented to this TeleHealth visit today. The patient is located at home, the provider is located at the [employer] [facility] office. The participants in this visit include the listed provider and patient. [name] was unable to use realtime audiovisual technology today and the telehealth visit was conducted via telephone.  HPI:   Chief Complaint: OBESITY Shannon [name] is here to discuss her progress with her obesity treatment plan. She is on the Category 2 plan + 100 calories and is following her eating plan approximately 90 % of the time. She states she is doing Sit and Fit video 15 to 20 minutes 2 times per week. Shannon [name] reports that she has lost 2 pounds since her last visit. She denies excessive hunger. She is using Ensure protein shakes in the morning when she doesn't feel like eating breakfast.  We were unable to weigh the patient today for this TeleHealth visit. She feels as if she has lost weight since her last visit. She has lost 3 lbs since starting treatment with Shannon [name].  Vitamin D deficiency Shannon [name] has a diagnosis of vitamin D deficiency. She is currently taking OTC vit D and denies nausea, vomiting or muscle weakness.  ASSESSMENT AND PLAN:  Vitamin D deficiency  Class 2 severe obesity with serious comorbidity and body mass index (BMI) of 35.0 to 35.9 in adult, unspecified obesity type ([place])   PLAN:  Vitamin D Deficiency Shannon [name] was informed that low vitamin D levels contribute to fatigue and are associated with obesity, breast, and colon cancer. Shannon [name] agrees to continue to take prescription Vit D @50 ,000 IU every week #4 with no refills and will follow up for routine testing of vitamin D, at least 2-3 times per year. She was informed of the risk of over-replacement of vitamin D and agrees to not increase her dose unless she discusses this with Shannon [name] first. [name] agrees to follow up in 3  weeks as directed.  Obesity Shannon [name] is currently in the action stage of change. As such, her goal is to continue with weight loss efforts. She has agreed to follow the Category 2 plan + 100 calories. Shannon [name] has been instructed to work up to a goal of 150 minutes of combined cardio and strengthening exercise per week for weight loss and overall health benefits. We discussed the following Behavioral Modification Strategies today: work on meal planning and easy cooking plans and no skipping meals.  Shannon [name] has agreed to follow up with our clinic in 3 weeks. She was informed of the importance of frequent follow up visits to maximize her success with intensive lifestyle modifications for her multiple health conditions.  I spent > than 50% of the 25 minute visit on counseling as documented in the note.   ALLERGIES: Allergies  Allergen Reactions  . Tree Extract Swelling and Other (See Comments)    Swelling to eyes  . Lisinopril Cough    Face/throat swelling  . Ciprofloxacin Hives    MEDICATIONS: Current Outpatient Medications on File Prior to Visit  Medication Sig Dispense Refill  . acetaminophen (TYLENOL) 325 MG tablet Take 325 mg by mouth every 6 (six) hours as needed (FOR PAIN).    [name] albuterol (PROAIR HFA) 108 (90 Base) MCG/ACT inhaler INHALE 2 PUFFS INTO THE LUNGS EVERY 4 HOURS AS NEEDED FOR WHEEZING OR SHORTNESS OF BREATH 8.5 g 6  . albuterol (PROVENTIL) (2.5 MG/3ML) 0.083% nebulizer solution USE 1 VIAL VIA NEBULIZER  EVERY 6 HOURS AS NEEDED FOR WHEEZING OR SHORTNESS OF BREATH 75 mL 6  . Ascorbic Acid (VITAMIN C) 1000 MG tablet Take 1,000 mg by mouth 2 (two) times a week.     [name] azelastine (OPTIVAR) 0.05 % ophthalmic solution Place 2 drops into both eyes 2 (two) times daily as needed (for swelling or itching).   3  . BIKTARVY 50-200-25 MG TABS tablet TAKE 1 TABLET BY MOUTH DAILY (Patient taking differently: 1 tablet daily. ) 30 tablet 5  . BIOTIN PO Take 1 tablet by mouth daily as  needed (for supplementation).     . Cholecalciferol (SM VITAMIN D3) 100 MCG (4000 UT) CAPS Take 1 capsule (4,000 Units total) by mouth daily. 30 capsule 0  . cloNIDine (CATAPRES) 0.1 MG tablet Take 1 tablet (0.1 mg total) by mouth every morning. OK TO TAKE IN THE EVENING FOR TOP BLOOD PRESSURE ABOVE 150 60 tablet 5  . cyanocobalamin (,VITAMIN B-12,) 1000 MCG/ML injection ADMINISTER 1 ML(1000 MCG) IN THE MUSCLE EVERY 30 DAYS 1 mL 0  . diltiazem (CARDIZEM CD) 240 MG 24 hr capsule Take 1 capsule (240 mg total) by mouth daily. 90 capsule 3  . fluticasone (FLONASE) 50 MCG/ACT nasal spray Place 2 sprays into both nostrils daily. 16 g 2  . fluticasone furoate-vilanterol (BREO ELLIPTA) 200-25 MCG/INH AEPB Inhale 1 puff into the lungs daily. 1 each 0  . furosemide (LASIX) 40 MG tablet Take 1 tablet (40 mg total) by mouth daily. 90 tablet 3  . furosemide (LASIX) 40 MG tablet Take 40 mg by mouth daily as needed.    [name] LORazepam (ATIVAN) 0.5 MG tablet Take 1 tablet (0.5 mg total) by mouth 3 (three) times daily as needed for anxiety. 30 tablet 0  . MAGNESIUM PO Take by mouth every other day.    . Melatonin 5 MG TABS Take 5 mg by mouth at bedtime as needed (for sleep).    . montelukast (SINGULAIR) 10 MG tablet Take 1 tablet (10 mg total) by mouth at bedtime. 30 tablet 2  . Mouthwashes (BIOTENE DRY MOUTH GENTLE) LIQD Use as directed 1 Dose in the mouth or throat 2 (two) times daily. 1 Bottle 0  . omeprazole (PRILOSEC) 40 MG capsule Take 1 capsule (40 mg total) by mouth daily. 90 capsule 3  . OXYGEN Inhale 1.5-2 L into the lungs continuous.     . polyethylene glycol (MIRALAX / GLYCOLAX) packet Take 17 g by mouth daily. 14 each 0  . potassium chloride SA (K-DUR,KLOR-CON) 20 MEQ tablet Take 20 mEq by mouth every other day.    [name] PREVIDENT 5000 SENSITIVE 1.1-5 % PSTE Use daily as directed  1  . rivaroxaban (XARELTO) 20 MG TABS tablet Take 1 tablet (20 mg total) by mouth daily with supper. 90 tablet 1  .  senna-docusate (SENOKOT-S) 8.6-50 MG tablet Take 1 tablet by mouth at bedtime as needed for mild constipation. 30 tablet 0  . sodium chloride (OCEAN) 0.65 % SOLN nasal spray Place 1 spray into both nostrils as needed for congestion. 88 mL 0  . Tetrahydrozoline HCl (VISINE OP) Place 2 drops into both eyes daily as needed (for dry eyes).      No current facility-administered medications on file prior to visit.     PAST MEDICAL HISTORY: Past Medical History:  Diagnosis Date  . Anemia   . Anxiety    HX PANIC ATTACKS  . Arthritis    "starting to; in my hands" (07/09/2015)  . Asthma   .  Atrial fibrillation ([place])   . Atrial flutter, paroxysmal ([place])   . Bloated abdomen   . CFS (chronic fatigue syndrome)   . Chewing difficulty   . Chronic asthma with acute exacerbation    "I have chronic asthma all the time; sometimes exacerbations" (07/09/2015)  . Chronic lower back pain   . COPD (chronic obstructive pulmonary disease) ([place])   . Cyst of right kidney    "3 of them; dx'd in ~ 01/2015"  . Dyspnea   . GERD (gastroesophageal reflux disease)   . Heart murmur   . History of blood transfusion    "related to my brain surgery I think"  . History of pulmonary embolism 07/09/2015  . HIV antibody positive ([place])   . HIV disease ([place])   . Hyperlipidemia   . Hypertension   . Leg edema   . Lipodystrophy   . Mild CAD 2013  . Palpitations   . Pneumonia 07/09/2015  . Shingles   . Sleep apnea    "never completed part 2 of study; never wore mask" (07/09/2015)  . Vitamin B 12 deficiency   . Vitamin D deficiency     PAST SURGICAL HISTORY: Past Surgical History:  Procedure Laterality Date  . ABDOMINAL HYSTERECTOMY     "robotic laparosopic"  . BRAIN SURGERY  1974   "brain tumor; benign; on top of my brain; got a plate in there"  . CARDIAC CATHETERIZATION    . TONSILLECTOMY AND ADENOIDECTOMY      SOCIAL HISTORY: Social History   Tobacco Use  . Smoking status: Never Smoker  . Smokeless  tobacco: Never Used  Substance Use Topics  . Alcohol use: Yes    Alcohol/week: 0.0 standard drinks    Comment: Rarely.  . Drug use: No    FAMILY HISTORY: Family History  Problem Relation Age of Onset  . Asthma Mother   . Heart failure Mother        cardiomyopathy  . Thyroid disease Mother   . Heart disease Mother   . Obesity Mother   . Heart murmur Sister   . Heart murmur Brother   . Diabetes Sister   . Thyroid disease Sister   . Heart murmur Sister     ROS: Review of Systems  Gastrointestinal: Negative for nausea and vomiting.  Musculoskeletal:       Negative for muscle weakness.    PHYSICAL EXAM: Pt in no acute distress  RECENT LABS AND TESTS: BMET    Component Value Date/Time   NA 144 11/09/2018 1043   K 4.1 11/09/2018 1043   CL 98 11/09/2018 1043   CO2 30 (H) 11/09/2018 1043   GLUCOSE 106 (H) 11/09/2018 1043   GLUCOSE 134 (H) 11/03/2018 1430   BUN 21 11/09/2018 1043   CREATININE 1.30 (H) 11/09/2018 1043   CREATININE 1.77 (H) 11/03/2018 1430   CALCIUM 10.3 11/09/2018 1043   GFRNONAA 43 (L) 11/09/2018 1043   GFRNONAA 30 (L) 11/03/2018 1430   GFRAA 50 (L) 11/09/2018 1043   GFRAA 35 (L) 11/03/2018 1430   Lab Results  Component Value Date   HGBA1C 5.6 06/30/2018   HGBA1C 5.5 02/23/2018   HGBA1C 5.9 (H) 01/05/2017   Lab Results  Component Value Date   INSULIN 26.4 (H) 06/30/2018   CBC    Component Value Date/Time   WBC 5.7 11/03/2018 1430   RBC 4.16 11/03/2018 1430   HGB 12.8 11/03/2018 1430   HGB 12.5 06/29/2018 0942   HCT 38.1 11/03/2018 1430  HCT 38.6 06/29/2018 0942   PLT 196 11/03/2018 1430   PLT 203 06/29/2018 0942   MCV 91.6 11/03/2018 1430   MCV 94 06/29/2018 0942   MCH 30.8 11/03/2018 1430   MCHC 33.6 11/03/2018 1430   RDW 13.0 11/03/2018 1430   RDW 12.9 06/29/2018 0942   LYMPHSABS 1,522 11/03/2018 1430   LYMPHSABS 1.2 06/29/2018 0942   MONOABS 0.1 11/02/2017 1409   EOSABS 103 11/03/2018 1430   EOSABS 0.1 06/29/2018 0942    BASOSABS 23 11/03/2018 1430   BASOSABS 0.0 06/29/2018 0942   Iron/TIBC/Ferritin/ %Sat No results found for: IRON, TIBC, FERRITIN, IRONPCTSAT Lipid Panel     Component Value Date/Time   CHOL 253 (H) 05/03/2018 1043   TRIG 53 05/03/2018 1043   HDL 86 05/03/2018 1043   CHOLHDL 2.9 05/03/2018 1043   CHOLHDL 3.2 11/26/2016 1618   VLDL 18 11/26/2016 1618   LDLCALC 156 (H) 05/03/2018 1043   Hepatic Function Panel     Component Value Date/Time   PROT 7.0 11/03/2018 1430   PROT 6.7 06/30/2018 1016   ALBUMIN 4.1 06/30/2018 1016   AST 20 11/03/2018 1430   ALT 16 11/03/2018 1430   ALKPHOS 86 06/30/2018 1016   BILITOT 0.3 11/03/2018 1430   BILITOT 0.3 06/30/2018 1016   BILIDIR <0.1 (L) 08/25/2017 0700   IBILI NOT CALCULATED 08/25/2017 0700      Component Value Date/Time   TSH 0.411 (L) 06/30/2018 1016   TSH 0.475 08/25/2017 0700   TSH 0.690 08/08/2015 1345    Results for ANGELLI, BARUCH (MRN [id number]) as of 12/14/2018 15:45  Ref. Range 06/30/2018 10:16  Vitamin D, 25-Hydroxy Latest Ref Range: 30.0 - 100.0 ng/mL 36.5    I, Marcille Blanco, CMA, am acting as transcriptionist for Abby Potash, PA-C I, Abby Potash, PA-C have reviewed above note and agree with its content

## 2018-12-14 NOTE — Progress Notes (Unsigned)
Office: [phone]  /  Fax: [phone]    Date: December 19, 2018   Appointment Start Time:*** Duration:*** Provider: Glennie Isle, Psy.D. Type of Session: Individual Therapy  Location of Patient: *** Location of Provider: Office *** Type of Contact: Telepsychological Visit via [employer] Web[employer] ***  Session Content:Prior to initiating telepsychological services,@ was provided with an informed consent document via e-mail, which included the development of a safety plan (I.e., an emergency contact and emergency resources) in the event of an emergency/crisis.    *** Shannon [name] returned the completed consent form prior to today's appointment via *** as this provider's clinic is closed. This provider verbally reviewed the consent form during today's appointment prior to proceeding with the appointment. Shannon [name] verbally acknowledged understanding that she is ultimately responsible for understanding her insurance benefits as it relates to reimbursement of telepsychological services. This provider also reviewed confidentiality, as it relates to telepsychological services, as well as the rationale for telepsychological services. More specifically, this provider's clinic is closed for in-person visits due to COVID-19. Therapeutic services will resume to in-person appointments once the clinic re-opens. Shannon [name] expressed understanding regarding the rationale for telepsychological services. In addition, this provider explained the telepsychological services informed consent document would be considered an addendum to the initial consent document.@ verbally consented to proceed. Regarding MyChart messages, Shannon [name] verbally acknowledged understanding that any messages sent would be a part of her electronic medical record; therefore, visible to all providers.    *** Shannon [name] was unable to complete the form and return it to this provider prior to today's appointment. As such, this provider verbally discussed the  consent form during today's appointment. Shannon [name] provided an emergency contact, and verbally acknowledged understanding that she is ultimately responsible for [employer] benefits as it relates to reimbursement of telepsychological services. This provider also reviewed confidentiality, as it relates to telepsychological services, as well as the rationale for telepsychological services. More specifically, this provider's clinic is closed for in-person visits due to COVID-19. Therapeutic services will resume to in-person appointments once the clinic re-opens. Shannon [name] expressed understanding regarding the rationale for telepsychological services. In addition, this provider explained the telepsychological services informed consent document would be considered an addendum to the initial consent document.@ verbally consented to proceed. Regarding MyChart messages, Shannon [name] verbally acknowledged understanding that any messages sent would be a part of her electronic medical record; therefore, visible to all providers.   Shannon [name] is a 63 y.o. female presenting via [employer] WebEx*** for a follow-up appointment to address the previously established treatment goal of decreasing emotional eating. Today's appointment was a telepsychological visit, as this provider's clinic is closed for in-person visits due to COVID-19. Therapeutic services will resume to in-person appointments once the clinic re-opens. Shannon [name] expressed understanding regarding the rationale for telepsychological services, and provided verbal consent for today's appointment. Prior to proceeding with today's appointment, Shannon [name]'s physical location at the time of this appointment was obtained. Shannon [name] reported she was at *** and provided the address. In the event of technical difficulties, Shannon [name] shared a phone number she could be reached at. Shannon [name] and this provider participated in today's telepsychological service. Also, Shannon [name] denied anyone else  being present in the room or on the WebEx appointment ***. This provider verbally administered the PHQ-9 and GAD-7, and conducted a brief check-in. ***   Shannon [name] was receptive to today's session as evidenced by openness to sharing, responsiveness to feedback, and ***.  Mental Status Examination:  Appearance: {Appearance:22431} Behavior: {Behavior:22445} Mood: {Teletherapy mood:22435} Affect: {Affect:22436} Speech: {Speech:22432} Eye  Contact: {Eye Contact:22433} Psychomotor Activity: {Motor Activity:22434} Thought Process: {thought process:22448}  Content/Perceptual Disturbances: {disturbances:22451} Orientation: {Orientation:22437} Cognition/Sensorium: {gbcognition:22449} Insight: {Insight:22446} Judgment: {Insight:22446}  Structured Assessment Results: The Patient Health Questionnaire-9 (PHQ-9) is a self-report measure that assesses symptoms and severity of depression over the course of the last two weeks. Shannon [name] obtained a score of *** suggesting {GBPHQ9SEVERITY:21752}. Shannon [name] finds the endorsed symptoms to be {gbphq9difficulty:21754}.  The Generalized Anxiety Disorder-7 (GAD-7) is a brief self-report measure that assesses symptoms of anxiety over the course of the last two weeks. Shannon [name] obtained a score of *** suggesting {gbgad7severity:21753}.  Interventions:  {Interventions:22172}  DSM-5 Diagnosis: 311 (F32.8) Other Specified Depressive Disorder, Emotional Eating Behaviors  Treatment Goal & Progress: During the initial appointment with this provider, the following treatment goal was established: decrease emotional eating. Shannon [name] has demonstrated progress in her goal as evidenced by ***  Plan: Shannon [name] continues to appear able and willing to participate as evidenced by engagement in reciprocal conversation, and asking questions for clarification as appropriate.*** The next appointment will be scheduled in {gbweeks:21758}, which will be via [employer]. Once this provider's office  resumes in-person appointments, Shannon [name] will be notified. The next session will focus on reviewing learned skills, and working towards the established treatment goal.***

## 2018-12-14 NOTE — Progress Notes (Unsigned)
Office: [phone]  /  Fax: [phone]    Date: December 14, 2018   Appointment Start Time:*** Duration:*** Provider: Glennie Isle, Psy.D. Type of Session: Individual Therapy  Location of Patient: *** Location of Provider: Home Type of Contact: Telepsychological Visit via [employer] WebEx ***  Session Content:Prior to initiating telepsychological services, Shannon [name] was provided with an informed consent document via e-mail, which included the development of a safety plan (i.e., an emergency contact and emergency resources) in the event of an emergency/crisis.    *** Shannon [name] returned the completed consent form prior to today's appointment via *** as this provider's clinic is closed. This provider verbally reviewed the consent form during today's appointment prior to proceeding with the appointment. Shannon [name] verbally acknowledged understanding that she is ultimately responsible for understanding her insurance benefits as it relates to reimbursement of telepsychological services. This provider also reviewed confidentiality, as it relates to telepsychological services, as well as the rationale for telepsychological services. More specifically, this provider's clinic is closed for in-person visits due to COVID-19. Therapeutic services will resume to in-person appointments once the clinic re-opens. Shannon [name] expressed understanding regarding the rationale for telepsychological services. In addition, this provider explained the telepsychological services informed consent document would be considered an addendum to the initial consent document.@ verbally consented to proceed. Regarding MyChart messages, Shannon [name] verbally acknowledged understanding that any messages sent would be a part of her electronic medical record; therefore, visible to all providers.    *** Shannon [name] was unable to complete the form and return it to this provider prior to today's appointment. As such, this provider verbally discussed the  consent form during today's appointment. Shannon [name] provided an emergency contact, and verbally acknowledged understanding that she is ultimately responsible for Shannon [name] benefits as it relates to reimbursement of telepsychological services. This provider also reviewed confidentiality, as it relates to telepsychological services, as well as the rationale for telepsychological services. More specifically, this provider's clinic is closed for in-person visits due to COVID-19. Therapeutic services will resume to in-person appointments once the clinic re-opens. Shannon [name] expressed understanding regarding the rationale for telepsychological services. In addition, this provider explained the telepsychological services informed consent document would be considered an addendum to the initial consent document.@ verbally consented to proceed. Regarding MyChart messages, Shannon [name] verbally acknowledged understanding that any messages sent would be a part of her electronic medical record; therefore, visible to all providers.    Shannon [name] is a 63 y.o. female presenting via [employer] WebEx*** for a follow-up appointment to address the previously established treatment goal of decreasing emotional eating. Prior to proceeding with today's appointment, Shannon [name]'s physical location at the time of this appointment was obtained. Shannon [name] reported she was at *** and provided the address. In the event of technical difficulties, Shannon [name] shared a phone number she could be reached at. Shannon [name] and this provider participated in today's telepsychological service. Also, Shannon [name] denied anyone else being present in the room or on the WebEx appointment ***. This provider verbally administered the PHQ-9 and GAD-7, and conducted a brief check-in. ***   Shannon [name] was receptive to today's session as evidenced by openness to sharing, responsiveness to feedback, and ***.  Mental Status Examination:  Appearance: {Appearance:22431} Behavior:  {Behavior:22445} Mood: {Teletherapy mood:22435} Affect: {Affect:22436} Speech: {Speech:22432} Eye Contact: {Eye Contact:22433} Psychomotor Activity: {Motor Activity:22434} Thought Process: {thought process:22448}  Content/Perceptual Disturbances: {disturbances:22451} Orientation: {Orientation:22437} Cognition/Sensorium: {gbcognition:22449} Insight: {Insight:22446} Judgment: {Insight:22446}  Structured Assessment Results: The Patient Health Questionnaire-9 (PHQ-9) is a self-report measure that assesses symptoms and severity of depression over  the course of the last two weeks. Shannon [name] obtained a score of *** suggesting {GBPHQ9SEVERITY:21752}. Shannon [name] finds the endorsed symptoms to be {gbphq9difficulty:21754}.  The Generalized Anxiety Disorder-7 (GAD-7) is a brief self-report measure that assesses symptoms of anxiety over the course of the last two weeks. Shannon [name] obtained a score of *** suggesting {gbgad7severity:21753}.  Interventions:  {Interventions:22172}  DSM-5 Diagnosis: 311 (F32.8) Other Specified Depressive Disorder, Emotional Eating Behaviors  Treatment Goal & Progress: During the initial appointment with this provider, the following treatment goal was established: decrease emotional eating. Shannon [name] has demonstrated progress in her goal as evidenced by ***  Plan: Shannon [name] continues to appear able and willing to participate as evidenced by engagement in reciprocal conversation, and asking questions for clarification as appropriate.*** The next appointment will be scheduled in {gbweeks:21758}, which will be via [employer]. Once this provider's office resumes in-person appointments, Shannon [name] will be notified. The next session will focus on reviewing learned skills, and working towards the established treatment goal.***

## 2018-12-19 ENCOUNTER — Telehealth (INDEPENDENT_AMBULATORY_CARE_PROVIDER_SITE_OTHER): Payer: Self-pay | Admitting: Psychology

## 2018-12-19 ENCOUNTER — Ambulatory Visit (INDEPENDENT_AMBULATORY_CARE_PROVIDER_SITE_OTHER): Payer: Self-pay | Admitting: Psychology

## 2018-12-19 NOTE — Telephone Encounter (Signed)
  Office: [phone]  /  Fax: [phone]  Date of Call: December 19, 2018 Time of Call: 11:34am Provider: Glennie Isle, PsyD  CONTENT: This provider called [name] as she did not present for her 11:30am WebEx appointment. A HIPAA compliant voicemail was left requesting a call back.   PLAN: This provider will wait for Hermena to call back or send a MyChart message to reschedule.

## 2018-12-20 ENCOUNTER — Other Ambulatory Visit: Payer: Self-pay

## 2018-12-20 ENCOUNTER — Ambulatory Visit (INDEPENDENT_AMBULATORY_CARE_PROVIDER_SITE_OTHER): Payer: Medicare Other | Admitting: Psychology

## 2018-12-20 ENCOUNTER — Telehealth: Payer: Self-pay | Admitting: Cardiovascular Disease

## 2018-12-20 ENCOUNTER — Encounter (INDEPENDENT_AMBULATORY_CARE_PROVIDER_SITE_OTHER): Payer: Self-pay

## 2018-12-20 DIAGNOSIS — F3289 Other specified depressive episodes: Secondary | ICD-10-CM | POA: Diagnosis not present

## 2018-12-20 NOTE — Progress Notes (Signed)
Office: [phone]  /  Fax: [phone]    Date: December 20, 2018   Appointment Start Time: 10:45am Duration: 21 mintues Provider: Glennie Isle, Psy.D. Type of Session: Individual Therapy  Location of Patient: Home Location of Provider: [facility] Type of Contact: Telepsychological Visit via [employer] WebEx   Session Content: Prior to initiating telepsychological services, Shannon [name] was provided with an informed consent document via e-mail, which included the development of a safety plan (I.e., an emergency contact and emergency resources) in the event of an emergency/crisis. Shannon [name] was unable to complete the form and return it to this provider prior to today's appointment. As such, this provider verbally discussed the consent form during today's appointment. Shannon [name] provided an emergency contact, and verbally acknowledged understanding that she is ultimately responsible for understanding her insurance benefits as it relates to reimbursement of telepsychological services. This provider also reviewed confidentiality, as it relates to telepsychological services, as well as the rationale for telepsychological services. More specifically, this provider's clinic is closed for in-person visits due to COVID-19. Therapeutic services will resume to in-person appointments once the clinic re-opens. Shannon [name] expressed understanding regarding the rationale for telepsychological services. In addition, this provider explained the telepsychological services informed consent document would be considered an addendum to the initial consent document. Shannon [name] verbally consented to proceed.  Of note, this provider called Shannon [name] at 11:03am, as she did not present for her WebEx appointment at 11:00am. She indicted she was experiencing difficulty connecting despite having the WebEx app download. Thus, this provider assisted Shannon [name] with troubleshooting. As such, today's appointment was initiated 15  minutes late.   Shannon [name] is a 63 y.o. female presenting via [place] for a follow-up appointment to address the previously established treatment goal of decreasing emotional eating. Prior to proceeding with today's appointment, [name]'s physical location at the time of this appointment was obtained. [name] reported she was at home and provided the address. In the event of technical difficulties, Shannon [name] shared a phone number she could be reached at. Shannon [name] and this provider participated in today's telepsychological service. Also, Shannon [name] denied anyone else being present in the room or on the WebEx appointment.   This provider verbally administered the PHQ-9 and GAD-7, and conducted a brief check-in. [name] explained the endorsed symptoms on the PHQ-9 and GAD-7 are due to her current medical conditions and not being able to see her providers due to the pandemic. She explained she is scheduled for an MRI of her stomach and continues to experience swelling in her legs. Shannon [name]'s frustration regarding the aforementioned was validated, and associated thoughts and feelings were briefly processed. Regarding eating, Shannon [name] shared, she is "not exactly following the structured meal plan" due to her medical conditions. She also acknowledged, "I eat just to be eating." As such, emotional versus physical hunger was reviewed. Shannon [name] requested the handout for the aforementioned be re-sent. Prior to sending the MyChart message with the handout, this provider explained the message would be visible to all providers, as it would be part of the electronic medical record. Shannon [name] verbally acknowledged understanding, and verbally consented to this provider sending the MyChart message. Shannon [name] was receptive to today's session as evidenced by openness to sharing, responsiveness to feedback, and willingness to explore her eating habits.  Mental Status Examination:  Appearance: neat Behavior: cooperative Mood: euthymic, but  tired Affect: mood congruent Speech: normal in rate, volume, and tone Eye Contact: appropriate Psychomotor Activity: appropriate Thought Process: linear, logical, and goal directed  Content/Perceptual Disturbances: denies  suicidal and homicidal ideation, plan, and intent and no hallucinations, delusions, bizarre thinking or behavior reported or observed Orientation: time, person, place and purpose of appointment Cognition/Sensorium: memory, attention, language, and fund of knowledge intact  Insight: fair Judgment: fair  Structured Assessment Results: The Patient Health Questionnaire-9 (PHQ-9) is a self-report measure that assesses symptoms and severity of depression over the course of the last two weeks. Shannon [name] obtained a score of 13 suggesting moderate depression. Shannon [name] finds the endorsed symptoms to be somewhat difficult. Depression screen PHQ 2/9 12/20/2018  Decreased Interest 1  Down, Depressed, Hopeless 1  PHQ - 2 Score 2  Altered sleeping 3  Tired, decreased energy 3  Change in appetite 1  Feeling bad or failure about yourself  0  Trouble concentrating 1  Moving slowly or fidgety/restless 3  Suicidal thoughts 0  PHQ-9 Score 13  Difficult doing work/chores -  Some recent data might be hidden   The Generalized Anxiety Disorder-7 (GAD-7) is a brief self-report measure that assesses symptoms of anxiety over the course of the last two weeks. Shannon [name] obtained a score of 5 suggesting mild anxiety. GAD 7 : Generalized Anxiety Score 12/20/2018  Nervous, Anxious, on Edge 1  Control/stop worrying 1  Worry too much - different things 2  Trouble relaxing 0  Restless 0  Easily annoyed or irritable 0  Afraid - awful might happen 1  Total GAD 7 Score 5  Anxiety Difficulty Somewhat difficult   Interventions:  Administration of PHQ-9 and GAD-7 for symptom monitoring Empathic reflections and validation Processing thoughts and feelings Psychoeducation regarding physical versus  emotional hunger Brief chart review  DSM-5 Diagnosis: 311 (F32.8) Other Specified Depressive Disorder, Emotional Eating Behaviors  Treatment Goal & Progress: During the initial appointment with this provider, the following treatment goal was established: decrease emotional eating. Progress is limited, as there was a lapse in treatment; however, she is receptive to the interaction and interventions and rapport is being established.   Plan: Shannon [name] continues to appear able and willing to participate as evidenced by engagement in reciprocal conversation, and asking questions for clarification as appropriate. The next appointment will be scheduled in two weeks, which will be via [employer]. Once this provider's office resumes in-person appointments, Keysha will be notified. The next session will focus on discussing triggers for emotional eating.

## 2018-12-21 ENCOUNTER — Other Ambulatory Visit: Payer: Self-pay

## 2018-12-21 ENCOUNTER — Telehealth: Payer: Medicare Other | Admitting: Cardiovascular Disease

## 2018-12-21 ENCOUNTER — Telehealth: Payer: Self-pay | Admitting: *Deleted

## 2018-12-21 ENCOUNTER — Telehealth: Payer: Self-pay | Admitting: Cardiovascular Disease

## 2018-12-21 NOTE — Telephone Encounter (Signed)
Patient cancelled her appointment with Dr Oval Linsey today secondary to sinus headache. Left message to call back to reschedule

## 2018-12-21 NOTE — Telephone Encounter (Signed)
New Message   Pt is calling because she cant do the virtual visit today and wants to change it for another day

## 2018-12-21 NOTE — Telephone Encounter (Signed)
Cancelled earlier today  Left message to call back to get rescheduled

## 2018-12-24 ENCOUNTER — Other Ambulatory Visit: Payer: Self-pay | Admitting: Internal Medicine

## 2018-12-26 ENCOUNTER — Ambulatory Visit (HOSPITAL_COMMUNITY): Payer: Medicare Other

## 2018-12-28 NOTE — Progress Notes (Unsigned)
  Office: [phone]  /  Fax: [phone]    Date: Jan 02, 2019   Appointment Start Time:*** Duration:*** Provider: Glennie Isle, Psy.D. Type of Session: Individual Therapy  Location of Patient: *** Location of Provider: {Location of Service:[address]} Type of Contact: Telepsychological Visit via [employer] WebEx   Session Content: Winter is a 63 y.o. female presenting via [place] for a follow-up appointment to address the previously established treatment goal of decreasing emotional eating. Today's appointment was a telepsychological visit, as this provider's clinic is closed for in-person visits due to COVID-19. Therapeutic services will resume to in-person appointments once the clinic re-opens. Shanikqua expressed understanding regarding the rationale for telepsychological services, and provided verbal consent for today's appointment. Prior to proceeding with today's appointment, Starasia's physical location at the time of this appointment was obtained. Mikeyla reported she was at *** and provided the address. In the event of technical difficulties, Merlie shared a phone number she could be reached at. [name] and this provider participated in today's telepsychological service. Also, Marium denied anyone else being present in the room or on the WebEx appointment ***.  This provider conducted a brief check-in and verbally administered the PHQ-9 and GAD-7. ***  This provider and Denecia discussed termination planning. At the onset of therapeutic services the duration of treatment was discussed to be a total of 4 to 6 sessions; however, due to the pandemic, this provider will continue meeting with Lacrecia as deemed necessary and appropriate. Temica verbally acknowledged understanding of the aforementioned, and was receptive to double-checking insurance benefits.  Kate was receptive to today's session as evidenced by openness to sharing, responsiveness to feedback, and ***.  Mental Status  Examination:  Appearance: {Appearance:22431} Behavior: {Behavior:22445} Mood: {Teletherapy mood:22435} Affect: {Affect:22436} Speech: {Speech:22432} Eye Contact: {Eye Contact:22433} Psychomotor Activity: {Motor Activity:22434} Thought Process: {thought process:22448}  Content/Perceptual Disturbances: {disturbances:22451} Orientation: {Orientation:22437} Cognition/Sensorium: {gbcognition:22449} Insight: {Insight:22446} Judgment: {Insight:22446}  Structured Assessment Results: The Patient Health Questionnaire-9 (PHQ-9) is a self-report measure that assesses symptoms and severity of depression over the course of the last two weeks. Raneshia obtained a score of *** suggesting {GBPHQ9SEVERITY:21752}. [name] finds the endorsed symptoms to be {gbphq9difficulty:21754}.  The Generalized Anxiety Disorder-7 (GAD-7) is a brief self-report measure that assesses symptoms of anxiety over the course of the last two weeks. Kaleigha obtained a score of *** suggesting {gbgad7severity:21753}.  Interventions:  {Interventions:22172}  DSM-5 Diagnosis: 311 (F32.8) Other Specified Depressive Disorder, Emotional Eating Behaviors  Treatment Goal & Progress: During the initial appointment with this provider, the following treatment goal was established: decrease emotional eating. Germani has demonstrated progress in her goal as evidenced by ***  Plan: Mahiya continues to appear able and willing to participate as evidenced by engagement in reciprocal conversation, and asking questions for clarification as appropriate. The next appointment will be scheduled in {gbweeks:21758}, which will be via [employer]. Once this provider's office resumes in-person appointments, Coralie will be notified. The next session will focus on reviewing learned skills, and working towards the established treatment goal.***

## 2019-01-02 ENCOUNTER — Telehealth (INDEPENDENT_AMBULATORY_CARE_PROVIDER_SITE_OTHER): Payer: Self-pay | Admitting: Psychology

## 2019-01-02 ENCOUNTER — Ambulatory Visit (INDEPENDENT_AMBULATORY_CARE_PROVIDER_SITE_OTHER): Payer: Medicare Other | Admitting: Psychology

## 2019-01-02 ENCOUNTER — Ambulatory Visit (INDEPENDENT_AMBULATORY_CARE_PROVIDER_SITE_OTHER): Payer: Medicare Other | Admitting: Physician Assistant

## 2019-01-02 NOTE — Telephone Encounter (Signed)
  Office: [phone]  /  Fax: [phone]  Date of Call: Jan 02, 2019 Time of Call: 9:35am Provider: Glennie Isle, PsyD  CONTENT: This provider called [name] as she did not present for her 9:30am WebEx appointment. A HIPAA compliant voicemail was left requesting a call back.  PLAN: This provider will wait for [place] to call back or send a MyChart message to reschedule today's missed appointment.

## 2019-01-10 ENCOUNTER — Telehealth (INDEPENDENT_AMBULATORY_CARE_PROVIDER_SITE_OTHER): Payer: Self-pay | Admitting: Psychology

## 2019-01-10 NOTE — Telephone Encounter (Signed)
  Office: [phone]  /  Fax: [phone]  Date of Call: Jan 10, 2019 Time of Call: 3:28pm Duration of Call: ~ 6 minutes Provider: Glennie Isle, PsyD  CONTENT: This provider called [name] to check-in and schedule a follow-up appointment. She reported, "I feel better." Thus, Naylin was receptive to rescheduling her missed appointment. A brief risk assessment. Tabytha denied experiencing suicidal and homicidal ideation, plan, and intent since the last appointment with this provider.   PLAN: Nyia is scheduled for an appointment with this provider on January 25, 2019 at 10:30am.

## 2019-01-11 DIAGNOSIS — G4733 Obstructive sleep apnea (adult) (pediatric): Secondary | ICD-10-CM | POA: Diagnosis not present

## 2019-01-11 DIAGNOSIS — J45909 Unspecified asthma, uncomplicated: Secondary | ICD-10-CM | POA: Diagnosis not present

## 2019-01-11 DIAGNOSIS — R269 Unspecified abnormalities of gait and mobility: Secondary | ICD-10-CM | POA: Diagnosis not present

## 2019-01-11 DIAGNOSIS — J449 Chronic obstructive pulmonary disease, unspecified: Secondary | ICD-10-CM | POA: Diagnosis not present

## 2019-01-18 NOTE — Telephone Encounter (Signed)
Patient scheduled for virtual visit tomorrow

## 2019-01-19 ENCOUNTER — Telehealth (INDEPENDENT_AMBULATORY_CARE_PROVIDER_SITE_OTHER): Payer: Medicare Other | Admitting: Cardiovascular Disease

## 2019-01-19 ENCOUNTER — Encounter: Payer: Self-pay | Admitting: Cardiovascular Disease

## 2019-01-19 VITALS — BP 168/100 | HR 83 | Ht 64.0 in | Wt 220.0 lb

## 2019-01-19 DIAGNOSIS — I5032 Chronic diastolic (congestive) heart failure: Secondary | ICD-10-CM

## 2019-01-19 DIAGNOSIS — I10 Essential (primary) hypertension: Secondary | ICD-10-CM

## 2019-01-19 DIAGNOSIS — Z9119 Patient's noncompliance with other medical treatment and regimen: Secondary | ICD-10-CM | POA: Diagnosis not present

## 2019-01-19 DIAGNOSIS — I739 Peripheral vascular disease, unspecified: Secondary | ICD-10-CM

## 2019-01-19 DIAGNOSIS — E881 Lipodystrophy, not elsewhere classified: Secondary | ICD-10-CM | POA: Diagnosis not present

## 2019-01-19 DIAGNOSIS — Z91199 Patient's noncompliance with other medical treatment and regimen due to unspecified reason: Secondary | ICD-10-CM

## 2019-01-19 MED ORDER — CLONIDINE HCL 0.1 MG PO TABS: 0.1000 mg | ORAL_TABLET | Freq: Two times a day (BID) | ORAL | 5 refills | Status: DC

## 2019-01-19 MED ORDER — PULSE OXIMETER FOR FINGER MISC: 1.0000 [IU] | 0 refills | Status: AC | PRN

## 2019-01-19 NOTE — Progress Notes (Signed)
Virtual Visit via Video Note    Evaluation Performed:  Follow-up visit  This visit type was conducted due to national recommendations for restrictions regarding the COVID-19 Pandemic (e.g. social distancing).  This format is felt to be most appropriate for this patient at this time.  All issues noted in this document were discussed and addressed.  No physical exam was performed (except for noted visual exam findings with Video Visits).  Please refer to the patient's chart (MyChart message for video visits and phone note for telephone visits) for the patient's consent to telehealth for [facility].  Date:  01/19/2019   ID:  Shannon [name], DOB Oct 09, 1955, MRN [id number]  Patient Location:  Home  Provider location:   Office  PCP:  [name], MD  Cardiologist:  Skeet Latch, MD  Electrophysiologist:  None   Chief Complaint:  hypertension  History of Present Illness:    Shannon [name] is a 63 y.o. female with hypertension, paroxysmal atrial flutter, asthma, non-obstructive CAD, OSA, prior PE, moderate pulmonary hypertension, mild ascending aorta aneurysm, and HIV (well-controlled)  who presents via video conferencing for a telehealth visit today.  [name] was initially referred by Shannon Basques, MD, on 12/26/15. [name] was concerned about her lower extremity edema and shortness of breath.  She was started on lasix 20 mg every other day.  [name] noted shortness of breath after a hospitalization for pneumonia 06/2015.   She denied lower extremity edema or orthopnea but did report chest pain when laying down at night.  She was referred for an echo 01/2016 that revealed LVEF 60-65% awith grade 1 diastolic dysfunction and hypokinesis of the basal inferior wall.  She also had very mild aortic stenosis with a mean gradient of 9 mmHg and trivial AR.  PASP was 52 mmHg.  She had a Lexiscan Myoview at that time that was negative for ischemia.  [name] previously had a Lexiscan  Cardiolite in September 2013 that revealed a medium, mild reversible defect in the anterior wall and normal systolic function. She subsequently underwent cardiac catheterization that revealed nonobstructive coronary disease. She had mild pulmonary hypertension and a normal cardiac output. Left ventriculography revealed mild to moderate aortic regurgitation.  She had a 24-hour Holter in February 2015 that revealed rare PACs and PVCs.  Echo 01/06/17 revealed LVEF 60-65% with mild LVH and a mean aortic valve gradient of 8 mmHg.  Tricuspid regurgitation was not significant enough to assess pulmonary pressures  [name] was admitted to the hospital 01/25/17 with hypertensive emergency and chest pain. Her blood pressure was 215/100.  Her blood pressure in the ED was 185/119. She was again seen in the ED 01/28/17 with shortness of breath and wheezing. Her blood pressure at that time was 190/120. It was noted that she hadn't been taking her medication as prescribed.  She has stopped taking losartan because her blood pressure gets too low and she feels poorly.  She notes that her blood pressure is elevated when she is in pain.  She has a lot of pain from her neck and back.  She was also in the ED 08/25/17 for a URI and on 08/18/17 for R sided chest and neck pain.  Her BP was 172/97 at the time.   Shannon [name] saw Shannon Ransom, PA-C on 12/2017 and her BP was poorly controlled.  She wasn't taking lasix due to fear it would dehydrate her.  Shannon [name] suggested switching to a different ARB or starting a  daily diuretic but she declined.  She was not taking losartan 2/2 recall so she was started on lisinopril. Since then she was admitted for an asthma exacerbation 03/2018.  At that time her BP was poorly controlled.  Her beta blocker was stopped and her BP was controlled on her other home antihypertensives.  Since then she followed up with her PCP 04/26/18 for headache and her BP was 193/112.  She had continued to take carvedilol and this was  discontinued at her clinic.  However they restarted her nifedipine.   Shannon [name] has [facility] and has received a lot of help there.  She saw Shannon Ferries, PA-C on 1/3 and after a call to her pharmacy it was noted that she has not taking diltiazem. Since her last appointment lisinopril was stopped due to cough and throat swelling.  Her cough has improved.  She notes that her BP has been running high at home.  She takes clonidine when her BP is ?160 and this works well.  She also had LE edema but has changed her diet to limit meat and sugar.  Her swelling has improved, which she attributed to her dietary changes.  She lost 7lb in 2 weeks.  Shannon [name] had ABIs performed by her PCP, Dr. Daryll Drown. The full results are not available, but it was reportedly abnormal.  ABI/Dopplers have been ordered but not yet performed 2/2 COVID-19.  At her last appointment Shannon [name] wanted to stop nifedipine due to concern that it was worsening her LE edema.  Since then her legs continue to swell.  She notices that her legs swell more when she is constipation.  Lately her BP has been mostly in the 130s-140s for most of May.  She notes that it has been higher in the last couple days.  She thinks her BP is high in the last two days because she is constipated.  She feels that the clonidine is really helping but thinks her BP is high because of not being active.  She isn't getting out of the house 2/2 COVID-19.  She is frustrated that she continues to have lower extremity edema but denies orthopnea or PND.  She also complains of difficulty sleeping and wonders if she has diabetes.  The patient does not have symptoms concerning for COVID-19 infection (fever, chills, cough, or new shortness of breath).    Prior CV studies:   The following studies were reviewed today:  Echo 01/06/17: Study Conclusions  - Left ventricle: The cavity size was normal. Wall thickness was   increased in a  pattern of mild LVH. Systolic function was normal.   The estimated ejection fraction was in the range of 60% to 65%.   Wall motion was normal; there were no regional wall motion   abnormalities. Doppler parameters are consistent with abnormal   left ventricular relaxation (grade 1 diastolic dysfunction). - Aortic valve: Trileaflet; moderately calcified leaflets.   Sclerosis without stenosis. There was mild to moderate   regurgitation. Mean gradient (S): 8 mm Hg. - Mitral valve: There was no significant regurgitation. - Left atrium: The atrium was moderately dilated. - Right ventricle: The cavity size was normal. Systolic function   was normal. - Right atrium: The atrium was mildly dilated. - Pulmonary arteries: No complete TR doppler jet so unable to   estimate PA systolic pressure. - Systemic veins: IVC measured 2.2 cm with > 50% respirophasic   variation, suggesting RA pressure 8  mmHg.  Impressions:  - Normal LV size with mild LV hypertrophy. EF 60-65%. Calcified,   trileaflet aortic valve. Sclerosis without significant stenosis,   mild to moderate aortic insufficiency. Normal RV size and   systolic function. Biatrial enlargement.  Past Medical History:  Diagnosis Date   Anemia    Anxiety    HX PANIC ATTACKS   Arthritis    "starting to; in my hands" (07/09/2015)   Asthma    Atrial fibrillation (HCC)    Atrial flutter, paroxysmal (HCC)    Bloated abdomen    CFS (chronic fatigue syndrome)    Chewing difficulty    Chronic asthma with acute exacerbation    "I have chronic asthma all the time; sometimes exacerbations" (07/09/2015)   Chronic lower back pain    COPD (chronic obstructive pulmonary disease) ([place])    Cyst of right kidney    "3 of them; dx'd in ~ 01/2015"   Dyspnea    GERD (gastroesophageal reflux disease)    Heart murmur    History of blood transfusion    "related to my brain surgery I think"   History of pulmonary embolism 07/09/2015    HIV antibody positive ([place])    HIV disease ([place])    Hyperlipidemia    Hypertension    Leg edema    Lipodystrophy    Mild CAD 2013   Palpitations    Pneumonia 07/09/2015   Shingles    Sleep apnea    "never completed part 2 of study; never wore mask" (07/09/2015)   Vitamin B 12 deficiency    Vitamin D deficiency    Past Surgical History:  Procedure Laterality Date   ABDOMINAL HYSTERECTOMY     "robotic laparosopic"   [place]   "brain tumor; benign; on top of my brain; got a plate in there"   CARDIAC CATHETERIZATION     TONSILLECTOMY AND ADENOIDECTOMY       Current Meds  Medication Sig   acetaminophen (TYLENOL) 325 MG tablet Take 325 mg by mouth every 6 (six) hours as needed (FOR PAIN).   albuterol (PROVENTIL) (2.5 MG/3ML) 0.083% nebulizer solution USE 1 VIAL VIA NEBULIZER EVERY 6 HOURS AS NEEDED FOR WHEEZING OR SHORTNESS OF BREATH   Ascorbic Acid (VITAMIN C) 1000 MG tablet Take 1,000 mg by mouth 2 (two) times a week.    azelastine (OPTIVAR) 0.05 % ophthalmic solution Place 2 drops into both eyes 2 (two) times daily as needed (for swelling or itching).    BIKTARVY 50-200-25 MG TABS tablet TAKE 1 TABLET BY MOUTH DAILY (Patient taking differently: 1 tablet daily. )   BIOTIN PO Take 1 tablet by mouth daily as needed (for supplementation).    budesonide-formoterol (SYMBICORT) 80-4.5 MCG/ACT inhaler Symbicort 80 mcg-4.5 mcg/actuation HFA aerosol inhaler  INL 2 PFS ITL BID   Cholecalciferol (SM VITAMIN D3) 100 MCG (4000 UT) CAPS Take 1 capsule (4,000 Units total) by mouth daily.   cloNIDine (CATAPRES) 0.1 MG tablet Take 1 tablet (0.1 mg total) by mouth 2 (two) times daily.   cyanocobalamin (,VITAMIN B-12,) 1000 MCG/ML injection ADMINISTER 1 ML(1000 MCG) IN THE MUSCLE EVERY 30 DAYS   diltiazem (CARDIZEM CD) 240 MG 24 hr capsule Take 1 capsule (240 mg total) by mouth daily.   fluticasone (FLONASE) 50 MCG/ACT nasal spray Place 2 sprays into both  nostrils daily.   fluticasone furoate-vilanterol (BREO ELLIPTA) 200-25 MCG/INH AEPB Inhale 1 puff into the lungs daily.   furosemide (LASIX) 40 MG tablet Take 40  mg by mouth daily as needed.   LORazepam (ATIVAN) 0.5 MG tablet Take 1 tablet (0.5 mg total) by mouth 3 (three) times daily as needed for anxiety.   MAGNESIUM PO Take by mouth every other day.   Melatonin 5 MG TABS Take 5 mg by mouth at bedtime as needed (for sleep).   montelukast (SINGULAIR) 10 MG tablet Take 1 tablet (10 mg total) by mouth at bedtime.   Mouthwashes (BIOTENE DRY MOUTH GENTLE) LIQD Use as directed 1 Dose in the mouth or throat 2 (two) times daily.   omeprazole (PRILOSEC) 40 MG capsule Take 1 capsule (40 mg total) by mouth daily.   OXYGEN Inhale 1.5-2 L into the lungs continuous.    polyethylene glycol (MIRALAX / GLYCOLAX) packet Take 17 g by mouth daily.   potassium chloride SA (K-DUR,KLOR-CON) 20 MEQ tablet Take 20 mEq by mouth every other day.   PREVIDENT 5000 SENSITIVE 1.1-5 % PSTE Use daily as directed   PROAIR HFA 108 (90 Base) MCG/ACT inhaler INHALE 2 PUFFS INTO THE LUNGS EVERY 4 HOURS AS NEEDED FOR WHEEZING OR SHORTNESS OF BREATH   rivaroxaban (XARELTO) 20 MG TABS tablet Take 1 tablet (20 mg total) by mouth daily with supper.   senna-docusate (SENOKOT-S) 8.6-50 MG tablet Take 1 tablet by mouth at bedtime as needed for mild constipation.   sodium chloride (OCEAN) 0.65 % SOLN nasal spray Place 1 spray into both nostrils as needed for congestion.   Tetrahydrozoline HCl (VISINE OP) Place 2 drops into both eyes daily as needed (for dry eyes).    [DISCONTINUED] cloNIDine (CATAPRES) 0.1 MG tablet Take 1 tablet (0.1 mg total) by mouth every morning. OK TO TAKE IN THE EVENING FOR TOP BLOOD PRESSURE ABOVE 150     Allergies:   Tree extract; Lisinopril; and Ciprofloxacin   Social History   Tobacco Use   Smoking status: Never Smoker   Smokeless tobacco: Never Used  Substance Use Topics   Alcohol  use: Yes    Alcohol/week: 0.0 standard drinks    Comment: Rarely.   Drug use: No     Family Hx: The patient's family history includes Asthma in her mother; Diabetes in her sister; Heart disease in her mother; Heart failure in her mother; Heart murmur in her brother, sister, and sister; Obesity in her mother; Thyroid disease in her mother and sister.  ROS:   Please see the history of present illness.     All other systems reviewed and are negative.   Labs/Other Tests and Data Reviewed:    Recent Labs: 06/30/2018: TSH 0.411 11/03/2018: ALT 16; Brain Natriuretic Peptide 26; Hemoglobin 12.8; Platelets 196 11/09/2018: BUN 21; Creatinine, Ser 1.30; Potassium 4.1; Sodium 144   Recent Lipid Panel Lab Results  Component Value Date/Time   CHOL 253 (H) 05/03/2018 10:43 AM   TRIG 53 05/03/2018 10:43 AM   HDL 86 05/03/2018 10:43 AM   CHOLHDL 2.9 05/03/2018 10:43 AM   CHOLHDL 3.2 11/26/2016 04:18 PM   LDLCALC 156 (H) 05/03/2018 10:43 AM    Wt Readings from Last 3 Encounters:  01/19/19 220 lb (99.8 kg)  11/22/18 222 lb 6.4 oz (100.9 kg)  11/09/18 229 lb 14.4 oz (104.3 kg)     Objective:    BP (!) 168/100    Pulse 83    Ht 5\' 4"  (1.626 m)    Wt 220 lb (99.8 kg)    BMI 37.76 kg/m  GENERAL: Well-appearing.  No acute distress. HEENT: Pupils equal round.  Oral  mucosa unremarkable NECK:  No jugular venous distention, no visible thyromegaly EXT:  No edema, no cyanosis no clubbing SKIN:  No rashes no nodules NEURO:  Speech fluent.  Cranial nerves grossly intact.  Moves all 4 extremities freely PSYCH:  Cognitively intact, oriented to person place and time   ASSESSMENT & PLAN:    # Hypertension:  BP has been above goal since stopping lisinopril.  She also reports LE edema with a normal BNP.  Nifedipine was stopped due to LE edema but hasn't improved since stopping the medication.  Therefore, I do not think this is the cause. It is most likely attributable to venous insufficieny.  She was  encouraged to wear compression socks more regularly and elevate her legs.  She is not interested in restarting nifedipine as she is "already taking too many medications.  She does like clonidine and thinks it is helpful.  I suggested that she start taking it BID but she is hesitant because on some days her BP is reportedly controlled.  She will start taking it twice daily if her BP is >130/80.  Continue diltiazem and lasix. Ms. Baker has strong feelings about her elevated BP being associated with constipation and lack of physical activity.    # Aortic regurgitation # Aortic stenosis: Ms. Scharnhorst has very mild aortic stenosis (mean gradient 9 mmHg) and mild aortic regurgitation.    # Hyperlipidemia: Lipids are elevated.  She is working on diet and would like to continue this before starting a statin.  Encouraged her to increase exercise as above.  Repeat lipids/CMP.   # Atrial flutter: Converted with flecainide.  Continue diltiazem and Xarelto.   # Ascending aorta aneurysm: 4.0 on CT 12/2016.  It was unchanged on MRA 10/2018.  Continue annual imaging for now.   # Abnormal ABI: Full results are not available.  ABI/Dopplers pending when safe after COVID-19.  We will refer to PV as needed.  COVID-19 Education: The signs and symptoms of COVID-19 were discussed with the patient and how to seek care for testing (follow up with PCP or arrange E-visit).  The importance of social distancing was discussed today.  Patient Risk:   After full review of this patient's clinical status, I feel that they are at least moderate risk at this time.  Time:   Today, I have spent 26 minutes with the patient with telehealth technology discussing hypertension.   Medication Adjustments/Labs and Tests Ordered: Current medicines are reviewed at length with the patient today.  Concerns regarding medicines are outlined above.   Tests Ordered: No orders of the defined types were placed in this encounter.  Medication  Changes: Meds ordered this encounter  Medications   cloNIDine (CATAPRES) 0.1 MG tablet    Sig: Take 1 tablet (0.1 mg total) by mouth 2 (two) times daily.    Dispense:  60 tablet    Refill:  5    NEW DOSE, D/C PREVIOUS RX    Disposition:  Follow up in 1 month.  Signed, Skeet Latch, MD  01/19/2019 12:30 PM    [facility]

## 2019-01-19 NOTE — Patient Instructions (Addendum)
Medication Instructions:  INCREASE YOUR CLONIDINE TO 0.1 MG TWICE A DAY   If you need a refill on your cardiac medications before your next appointment, please call your pharmacy.   Lab work: NONE  Testing/Procedures: Your physician has requested that you have an ankle brachial index (ABI). During this test an ultrasound and blood pressure cuff are used to evaluate the arteries that supply the arms and legs with blood. Allow thirty minutes for this exam. There are no restrictions or special instructions. WHEN COVID 19 RESTRICTIONS LIFTED AND AFTER YOUR APPOINTMENT WITH [facility]  THE OFFICE WILL CALL YOU WITH DATE AND TIME  Follow-Up: Your physician recommends that you schedule a follow-up appointment in: [place]   Your physician recommends that you schedule a follow-up appointment in: 4 MONTHS WITH DR [facility]   Any Other Special Instructions Will Be Listed Below (If Applicable).  Increase exercise to 150 min/week.  Start with just 5-10 min on the bike every day  MONITOR AND LOG YOUR BLOOD PRESSURE

## 2019-01-19 NOTE — Addendum Note (Signed)
Addended by: Alvina Filbert B on: 01/19/2019 02:07 PM   Modules accepted: Orders

## 2019-01-24 NOTE — Progress Notes (Signed)
Office: [phone]  /  Fax: [phone]    Date: January 25, 2019   Appointment Start Time: 10:40am Duration: 22 minutes Provider: Glennie Isle, Psy.D. Type of Session: Individual Therapy  Location of Patient: Home Location of Provider: Provider's Home Type of Contact: Telepsychological Visit via [employer] WebEx  Session Content: Shannon [name] is a 63 y.o. female presenting via [place] for a follow-up appointment to address the previously established treatment goal of decreasing emotional eating. Of note, this provider called Shannon [name] at 10:32am as she did not present for today's Webex appointment. She expressed uncertainity about how to join; therefore, further instructions were provided. This provider called [place] again at 10:36am, she still did not present and further instructions were provided. Thus, today's appointment was initiated 10 minutes minutes late. Shannon [name] consented to proceed despite a decreased duration of today's appointment. Today's appointment was a telepsychological visit, as this provider's clinic is seeing a limited number of patients for in-person visits due to COVID-19. Therapeutic services will resume to in-person appointments once deemed appropriate. Shannon [name] expressed understanding regarding the rationale for telepsychological services, and provided verbal consent for today's appointment. Prior to proceeding with today's appointment, Shannon [name]'s physical location at the time of this appointment was obtained. Shannon [name] reported she was at home and provided the address. In the event of technical difficulties, Shannon [name] shared a phone number she could be reached at. Shannon [name] and this provider participated in today's telepsychological service. Also, Shannon [name] denied anyone else being present in the room or on the WebEx appointment.  This provider conducted a brief check-in and verbally administered the PHQ-9 and GAD-7. Shannon [name] shared, "I haven't been in the best of health." Based on recent  events and its impact on the frequency of appointments, treatment moving forward was discussed. Shannon [name] shared, "I was just thinking even though I know I have to monitor my eating habits and meals, I take a lot of medicine." Due to ongoing stressors due to medical concerns, this provider recommended a referral for longer-term therapeutic services. She noted, "It's not causing me stress." Thus, she initially declined a referral at this time, but following the administration of the PHQ-9 and GAD-7, Shannon [name] provided verbal consent for this provider to place a referral for longer-term therapeutic services to address ongoing stressors secondary to medical conditions. Moreover, this provider discussed the importance of scheduling an appointment with her provider at the clinic to continue working towards eating congruent to her prescribed structured meal plan. She indicated she would call following this appointment. Shannon [name] of session focused on briefly discussing triggers for emotional eating that may be contributing to overeating, as endorsed on the PHQ-9. Shannon [name] provided verbal consent during today's appointment for this provider to send the handout for triggers via e-mail as well to resend the consent form for telepsychological services. Shannon [name] was receptive to today's session as evidenced by openness to sharing, responsiveness to feedback, and willingness to continue exploring contributors to eating habits.   Mental Status Examination:  Appearance: neat Behavior: cooperative Mood: euthymic Affect: mood congruent Speech: normal in rate, volume, and tone Eye Contact: appropriate Psychomotor Activity: appropriate Thought Process: linear, logical, and goal directed  Content/Perceptual Disturbances: denies suicidal and homicidal ideation, plan, and intent and no hallucinations, delusions, bizarre thinking or behavior reported or observed Orientation: time, person, place and purpose of appointment  Cognition/Sensorium: memory, attention, language, and fund of knowledge intact  Insight: fair Judgment: fair  Structured Assessment Results: The Patient Health Questionnaire-9 (PHQ-9) is a self-report measure that assesses symptoms and severity  of depression over the course of the last two weeks. Shannon [name] obtained a score of 6 suggesting mild depression. Shannon [name] finds the endorsed symptoms to be somewhat difficult. Decreased interest 0  Down, depressed, hopeless 1  Altered sleeping 1  Tired, decreased energy 1  Change in appetite 1  Feeling bad or failure about yourself 1  Trouble concentrating 1  Moving slowly or fidgety/restless 0  Suicidal thoughts 0  PHQ-9 Score 6    The Generalized Anxiety Disorder-7 (GAD-7) is a brief self-report measure that assesses symptoms of anxiety over the course of the last two weeks. Shannon [name] obtained a score of 3 suggesting minimal anxiety. Shannon [name] finds the endorsed symptoms to be not difficult at all. Nervous, anxious, on edge 0  Control/stop worrying 1  Worrying too much- different things 1  Trouble relaxing 1  Restless 0  Easily annoyed or irritable 0  Afraid-awful might happen 0  GAD-7 Score 3   Interventions:  Conducted a brief chart review Verbal administration of PHQ-9 and GAD-7 for symptom monitoring Provided empathic reflections and validation Psychoeducation provided regarding triggers for emotional eating Discussed option for a referral for longer-term therapeutic services  DSM-5 Diagnosis: 311 (F32.8) Other Specified Depressive Disorder, Emotional Eating Behaviors  Treatment Goal & Progress: During the initial appointment with this provider, the following treatment goal was established: decrease emotional eating. Shannon [name] has demonstrated limited progress in her goal. Due to recent events and health concerns, appointments have been inconsistent; however, she continues to demonstrate willingness to focus on improving eating concerns.    Plan: Shannon [name] continues to appear able and willing to participate as evidenced by engagement in reciprocal conversation, and asking questions for clarification as appropriate. The next appointment will be scheduled in one week, which will be via [employer]. Once this provider's office resumes in-person appointments and it is deemed appropriate, Shannon [name] will be notified. The next session will focus further on triggers for emotional eating.

## 2019-01-25 ENCOUNTER — Ambulatory Visit (INDEPENDENT_AMBULATORY_CARE_PROVIDER_SITE_OTHER): Payer: Medicare Other | Admitting: Psychology

## 2019-01-25 ENCOUNTER — Other Ambulatory Visit: Payer: Self-pay

## 2019-01-25 DIAGNOSIS — F3289 Other specified depressive episodes: Secondary | ICD-10-CM

## 2019-01-26 ENCOUNTER — Encounter: Payer: Medicare Other | Admitting: Vascular Surgery

## 2019-01-30 ENCOUNTER — Encounter (INDEPENDENT_AMBULATORY_CARE_PROVIDER_SITE_OTHER): Payer: Self-pay | Admitting: Physician Assistant

## 2019-01-30 ENCOUNTER — Telehealth (INDEPENDENT_AMBULATORY_CARE_PROVIDER_SITE_OTHER): Payer: Self-pay | Admitting: Psychology

## 2019-01-30 ENCOUNTER — Other Ambulatory Visit: Payer: Self-pay

## 2019-01-30 ENCOUNTER — Ambulatory Visit (INDEPENDENT_AMBULATORY_CARE_PROVIDER_SITE_OTHER): Payer: Self-pay | Admitting: Psychology

## 2019-01-30 ENCOUNTER — Ambulatory Visit (INDEPENDENT_AMBULATORY_CARE_PROVIDER_SITE_OTHER): Payer: Medicare Other | Admitting: Physician Assistant

## 2019-01-30 DIAGNOSIS — Z6835 Body mass index (BMI) 35.0-35.9, adult: Secondary | ICD-10-CM | POA: Diagnosis not present

## 2019-01-30 DIAGNOSIS — E559 Vitamin D deficiency, unspecified: Secondary | ICD-10-CM | POA: Diagnosis not present

## 2019-01-30 NOTE — Telephone Encounter (Signed)
  Office: [phone]  /  Fax: [phone]  Date of Call: January 30, 2019  Time of Call: 10:03am Provider: Glennie Isle, PsyD  CONTENT: This provider called [name] to check-in as she did not present for today's Webex appointment at 10:00am. A HIPAA compliant voicemail was left requesting a a call back. Of note, this provider stayed on the [facility] appointment for 10 minutes prior to signing off.   PLAN: This provider will wait for Maryon to call back. If deemed necessary, this provider or the provider's clinic will call [place] again in approximately one week.

## 2019-01-30 NOTE — Progress Notes (Unsigned)
  Office: [phone]  /  Fax: [phone]    Date: January 30, 2019    Appointment Start Time:*** Duration:*** Provider: Glennie Isle, Psy.D. Type of Session: Individual Therapy  Location of Patient: *** Location of Provider: Provider's Home Type of Contact: Telepsychological Visit via [employer] WebEx   Session Content: Shannon [name] is a 63 y.o. female presenting via [place] for a follow-up appointment to address the previously established treatment goal of decreasing emotional eating. Today's appointment was a telepsychological visit, as this provider's clinic is seeing a limited number of patients for in-person visits due to COVID-19. Therapeutic services will resume to in-person appointments once deemed appropriate. Shannon [name] expressed understanding regarding the rationale for telepsychological services, and provided verbal consent for today's appointment. Prior to proceeding with today's appointment, Shannon [name]'s physical location at the time of this appointment was obtained. Shannon [name] reported she was at *** and provided the address. In the event of technical difficulties, Shannon [name] shared a phone number she could be reached at. Shannon [name] and this provider participated in today's telepsychological service. Also, Shannon [name] denied anyone else being present in the room or on the WebEx appointment ***.  This provider conducted a brief check-in and verbally administered the PHQ-9 and GAD-7. ***   Shannon [name] was receptive to today's session as evidenced by openness to sharing, responsiveness to feedback, and ***.  Mental Status Examination:  Appearance: {Appearance:22431} Behavior: {Behavior:22445} Mood: {Teletherapy mood:22435} Affect: {Affect:22436} Speech: {Speech:22432} Eye Contact: {Eye Contact:22433} Psychomotor Activity: {Motor Activity:22434} Thought Process: {thought process:22448}  Content/Perceptual Disturbances: {disturbances:22451} Orientation: {Orientation:22437} Cognition/Sensorium:  {gbcognition:22449} Insight: {Insight:22446} Judgment: {Insight:22446}  Structured Assessment Results: The Patient Health Questionnaire-9 (PHQ-9) is a self-report measure that assesses symptoms and severity of depression over the course of the last two weeks. Shannon [name] obtained a score of *** suggesting {GBPHQ9SEVERITY:21752}. Shannon [name] finds the endorsed symptoms to be {gbphq9difficulty:21754}. Decreased interest ***  Down, depressed, hopeless ***  Altered sleeping ***  Tired, decreased energy ***  Change in appetite ***  Feeling bad or failure about yourself ***  Trouble concentrating ***  Moving slowly or fidgety/restless ***  Suicidal thoughts ***  PHQ-9 Score ***    The Generalized Anxiety Disorder-7 (GAD-7) is a brief self-report measure that assesses symptoms of anxiety over the course of the last two weeks. Shannon [name] obtained a score of *** suggesting {gbgad7severity:21753}. Shannon [name] finds the endorsed symptoms to be {gbphq9difficulty:21754}. Nervous, anxious, on edge ***  Control/stop worrying ***  Worrying too much- different things ***  Trouble relaxing ***  Restless ***  Easily annoyed or irritable ***  Afraid-awful might happen ***  GAD-7 Score ***   Interventions:  {Interventions:22172}  DSM-5 Diagnosis:  311 (F32.8) Other Specified Depressive Disorder, Emotional Eating Behaviors  Treatment Goal & Progress: During the initial appointment with this provider, the following treatment goal was established: decrease emotional eating. Shannon [name] has demonstrated progress in her goal as evidenced by ***  Plan: Shannon [name] continues to appear able and willing to participate as evidenced by engagement in reciprocal conversation, and asking questions for clarification as appropriate. The next appointment will be scheduled in {gbweeks:21758}, which will be via [employer]. Once this provider's office resumes in-person appointments and it is deemed appropriate, Shannon [name] will be notified. The next  session will focus on reviewing learned skills, and working towards the established treatment goal.***

## 2019-01-31 NOTE — Progress Notes (Signed)
Office: [phone]  /  Fax: [phone] TeleHealth Visit:  Shannon [name] has verbally consented to this TeleHealth visit today. The patient is located at home, the provider is located at the [employer] [facility] office. The participants in this visit include the listed provider and patient. Fallen was unable to use realtime audiovisual technology today and the telehealth visit was conducted via telephone.  HPI:   Chief Complaint: OBESITY Shannon [name] is here to discuss her progress with her obesity treatment plan. She is on the Category 2 plan and is following her eating plan approximately 70 % of the time. She states she is exercising 0 minutes 0 times per week. Shannon [name] reports that she is having an MRI done tomorrow on her kidneys and is seeing a vascular doctor in 3 days for the swelling in her legs. She is not eating all of the food on the plan due to a decrease in appetite.  We were unable to weigh the patient today for this TeleHealth visit. She feels as if she has maintained weight since her last visit. She has lost 3 lbs since starting treatment with [place].  Vitamin D deficiency Shannon [name] has a diagnosis of vitamin D deficiency. She is currently taking OTC vit D and denies nausea, vomiting, or muscle weakness.  ASSESSMENT AND PLAN:  Vitamin D deficiency  Class 2 severe obesity with serious comorbidity and body mass index (BMI) of 35.0 to 35.9 in adult, unspecified obesity type ([place])  PLAN:  Vitamin D Deficiency Shannon [name] was informed that low vitamin D levels contribute to fatigue and are associated with obesity, breast, and colon cancer. Shannon [name] agrees to continue to take OTC Vit D and will follow up for routine testing of vitamin D, at least 2-3 times per year. She was informed of the risk of over-replacement of vitamin D and agrees to not increase her dose unless she discusses this with [place] first. Shannon [name] agrees to follow up in 3 weeks as directed.  Obesity Shannon [name] is currently  in the action stage of change. As such, her goal is to continue with weight loss efforts. She has agreed to follow the Category 2 plan. Shannon [name] has been instructed to work up to a goal of 150 minutes of combined cardio and strengthening exercise per week for weight loss and overall health benefits. We discussed the following Behavioral Modification Strategies today: work on meal planning and easy cooking plans and keeping healthy foods in the home.  Shannon [name] has agreed to follow up with our clinic in 3 weeks. She was informed of the importance of frequent follow up visits to maximize her success with intensive lifestyle modifications for her multiple health conditions.  ALLERGIES: Allergies  Allergen Reactions  . Tree Extract Swelling and Other (See Comments)    Swelling to eyes  . Lisinopril Cough    Face/throat swelling  . Ciprofloxacin Hives    MEDICATIONS: Current Outpatient Medications on File Prior to Visit  Medication Sig Dispense Refill  . acetaminophen (TYLENOL) 325 MG tablet Take 325 mg by mouth every 6 (six) hours as needed (FOR PAIN).    [name] albuterol (PROVENTIL) (2.5 MG/3ML) 0.083% nebulizer solution USE 1 VIAL VIA NEBULIZER EVERY 6 HOURS AS NEEDED FOR WHEEZING OR SHORTNESS OF BREATH 75 mL 6  . Ascorbic Acid (VITAMIN C) 1000 MG tablet Take 1,000 mg by mouth 2 (two) times a week.     [name] azelastine (OPTIVAR) 0.05 % ophthalmic solution Place 2 drops into both eyes 2 (two) times daily  as needed (for swelling or itching).   3  . BIKTARVY 50-200-25 MG TABS tablet TAKE 1 TABLET BY MOUTH DAILY (Patient taking differently: 1 tablet daily. ) 30 tablet 5  . BIOTIN PO Take 1 tablet by mouth daily as needed (for supplementation).     . Cholecalciferol (SM VITAMIN D3) 100 MCG (4000 UT) CAPS Take 1 capsule (4,000 Units total) by mouth daily. 30 capsule 0  . cloNIDine (CATAPRES) 0.1 MG tablet Take 1 tablet (0.1 mg total) by mouth 2 (two) times daily. 60 tablet 5  . cyanocobalamin (,VITAMIN  B-12,) 1000 MCG/ML injection ADMINISTER 1 ML(1000 MCG) IN THE MUSCLE EVERY 30 DAYS 1 mL 0  . diltiazem (CARDIZEM CD) 240 MG 24 hr capsule Take 1 capsule (240 mg total) by mouth daily. 90 capsule 3  . fluticasone (FLONASE) 50 MCG/ACT nasal spray Place 2 sprays into both nostrils daily. 16 g 2  . fluticasone furoate-vilanterol (BREO ELLIPTA) 200-25 MCG/INH AEPB Inhale 1 puff into the lungs daily. 1 each 0  . furosemide (LASIX) 40 MG tablet Take 40 mg by mouth daily as needed.    [name] LORazepam (ATIVAN) 0.5 MG tablet Take 1 tablet (0.5 mg total) by mouth 3 (three) times daily as needed for anxiety. 30 tablet 0  . MAGNESIUM PO Take by mouth every other day.    . Melatonin 5 MG TABS Take 5 mg by mouth at bedtime as needed (for sleep).    . Misc. Devices (PULSE OXIMETER FOR FINGER) MISC 1 Units by Does not apply route as needed. 1 each 0  . montelukast (SINGULAIR) 10 MG tablet Take 1 tablet (10 mg total) by mouth at bedtime. 30 tablet 2  . Mouthwashes (BIOTENE DRY MOUTH GENTLE) LIQD Use as directed 1 Dose in the mouth or throat 2 (two) times daily. 1 Bottle 0  . OXYGEN Inhale 1.5-2 L into the lungs continuous.     . polyethylene glycol (MIRALAX / GLYCOLAX) packet Take 17 g by mouth daily. 14 each 0  . potassium chloride SA (K-DUR,KLOR-CON) 20 MEQ tablet Take 20 mEq by mouth every other day.    [name] PREVIDENT 5000 SENSITIVE 1.1-5 % PSTE Use daily as directed  1  . PROAIR HFA 108 (90 Base) MCG/ACT inhaler INHALE 2 PUFFS INTO THE LUNGS EVERY 4 HOURS AS NEEDED FOR WHEEZING OR SHORTNESS OF BREATH 8.5 g 6  . rivaroxaban (XARELTO) 20 MG TABS tablet Take 1 tablet (20 mg total) by mouth daily with supper. 90 tablet 1  . senna-docusate (SENOKOT-S) 8.6-50 MG tablet Take 1 tablet by mouth at bedtime as needed for mild constipation. 30 tablet 0  . sodium chloride (OCEAN) 0.65 % SOLN nasal spray Place 1 spray into both nostrils as needed for congestion. 88 mL 0  . Tetrahydrozoline HCl (VISINE OP) Place 2 drops into both  eyes daily as needed (for dry eyes).     . budesonide-formoterol (SYMBICORT) 80-4.5 MCG/ACT inhaler Symbicort 80 mcg-4.5 mcg/actuation HFA aerosol inhaler  INL 2 PFS ITL BID    . furosemide (LASIX) 40 MG tablet Take 1 tablet (40 mg total) by mouth daily. 90 tablet 3  . omeprazole (PRILOSEC) 40 MG capsule Take 1 capsule (40 mg total) by mouth daily. (Patient not taking: Reported on 01/30/2019) 90 capsule 3   No current facility-administered medications on file prior to visit.     PAST MEDICAL HISTORY: Past Medical History:  Diagnosis Date  . Anemia   . Anxiety    HX PANIC ATTACKS  .  Arthritis    "starting to; in my hands" (07/09/2015)  . Asthma   . Atrial fibrillation ([place])   . Atrial flutter, paroxysmal ([place])   . Bloated abdomen   . CFS (chronic fatigue syndrome)   . Chewing difficulty   . Chronic asthma with acute exacerbation    "I have chronic asthma all the time; sometimes exacerbations" (07/09/2015)  . Chronic lower back pain   . COPD (chronic obstructive pulmonary disease) ([place])   . Cyst of right kidney    "3 of them; dx'd in ~ 01/2015"  . Dyspnea   . GERD (gastroesophageal reflux disease)   . Heart murmur   . History of blood transfusion    "related to my brain surgery I think"  . History of pulmonary embolism 07/09/2015  . HIV antibody positive ([place])   . HIV disease ([place])   . Hyperlipidemia   . Hypertension   . Leg edema   . Lipodystrophy   . Mild CAD 2013  . Palpitations   . Pneumonia 07/09/2015  . Shingles   . Sleep apnea    "never completed part 2 of study; never wore mask" (07/09/2015)  . Vitamin B 12 deficiency   . Vitamin D deficiency     PAST SURGICAL HISTORY: Past Surgical History:  Procedure Laterality Date  . ABDOMINAL HYSTERECTOMY     "robotic laparosopic"  . BRAIN SURGERY  1974   "brain tumor; benign; on top of my brain; got a plate in there"  . CARDIAC CATHETERIZATION    . TONSILLECTOMY AND ADENOIDECTOMY      SOCIAL HISTORY: Social  History   Tobacco Use  . Smoking status: Never Smoker  . Smokeless tobacco: Never Used  Substance Use Topics  . Alcohol use: Yes    Alcohol/week: 0.0 standard drinks    Comment: Rarely.  . Drug use: No    FAMILY HISTORY: Family History  Problem Relation Age of Onset  . Asthma Mother   . Heart failure Mother        cardiomyopathy  . Thyroid disease Mother   . Heart disease Mother   . Obesity Mother   . Heart murmur Sister   . Heart murmur Brother   . Diabetes Sister   . Thyroid disease Sister   . Heart murmur Sister     ROS: Review of Systems  Gastrointestinal: Negative for nausea and vomiting.  Musculoskeletal:       Negative for muscle weakness.    PHYSICAL EXAM: Pt in no acute distress  RECENT LABS AND TESTS: BMET    Component Value Date/Time   NA 144 11/09/2018 1043   K 4.1 11/09/2018 1043   CL 98 11/09/2018 1043   CO2 30 (H) 11/09/2018 1043   GLUCOSE 106 (H) 11/09/2018 1043   GLUCOSE 134 (H) 11/03/2018 1430   BUN 21 11/09/2018 1043   CREATININE 1.30 (H) 11/09/2018 1043   CREATININE 1.77 (H) 11/03/2018 1430   CALCIUM 10.3 11/09/2018 1043   GFRNONAA 43 (L) 11/09/2018 1043   GFRNONAA 30 (L) 11/03/2018 1430   GFRAA 50 (L) 11/09/2018 1043   GFRAA 35 (L) 11/03/2018 1430   Lab Results  Component Value Date   HGBA1C 5.6 06/30/2018   HGBA1C 5.5 02/23/2018   HGBA1C 5.9 (H) 01/05/2017   Lab Results  Component Value Date   INSULIN 26.4 (H) 06/30/2018   CBC    Component Value Date/Time   WBC 5.7 11/03/2018 1430   RBC 4.16 11/03/2018 1430   HGB 12.8  11/03/2018 1430   HGB 12.5 06/29/2018 0942   HCT 38.1 11/03/2018 1430   HCT 38.6 06/29/2018 0942   PLT 196 11/03/2018 1430   PLT 203 06/29/2018 0942   MCV 91.6 11/03/2018 1430   MCV 94 06/29/2018 0942   MCH 30.8 11/03/2018 1430   MCHC 33.6 11/03/2018 1430   RDW 13.0 11/03/2018 1430   RDW 12.9 06/29/2018 0942   LYMPHSABS 1,522 11/03/2018 1430   LYMPHSABS 1.2 06/29/2018 0942   MONOABS 0.1  11/02/2017 1409   EOSABS 103 11/03/2018 1430   EOSABS 0.1 06/29/2018 0942   BASOSABS 23 11/03/2018 1430   BASOSABS 0.0 06/29/2018 0942   Iron/TIBC/Ferritin/ %Sat No results found for: IRON, TIBC, FERRITIN, IRONPCTSAT Lipid Panel     Component Value Date/Time   CHOL 253 (H) 05/03/2018 1043   TRIG 53 05/03/2018 1043   HDL 86 05/03/2018 1043   CHOLHDL 2.9 05/03/2018 1043   CHOLHDL 3.2 11/26/2016 1618   VLDL 18 11/26/2016 1618   LDLCALC 156 (H) 05/03/2018 1043   Hepatic Function Panel     Component Value Date/Time   PROT 7.0 11/03/2018 1430   PROT 6.7 06/30/2018 1016   ALBUMIN 4.1 06/30/2018 1016   AST 20 11/03/2018 1430   ALT 16 11/03/2018 1430   ALKPHOS 86 06/30/2018 1016   BILITOT 0.3 11/03/2018 1430   BILITOT 0.3 06/30/2018 1016   BILIDIR <0.1 (L) 08/25/2017 0700   IBILI NOT CALCULATED 08/25/2017 0700      Component Value Date/Time   TSH 0.411 (L) 06/30/2018 1016   TSH 0.475 08/25/2017 0700   TSH 0.690 08/08/2015 1345   Results for MIKKA, KISSNER (MRN [id number]) as of 01/31/2019 14:29  Ref. Range 06/30/2018 10:16  Vitamin D, 25-Hydroxy Latest Ref Range: 30.0 - 100.0 ng/mL 36.5     I, Marcille Blanco, CMA, am acting as transcriptionist for Abby Potash, PA-C I, Abby Potash, PA-C have reviewed above note and agree with its content

## 2019-02-01 ENCOUNTER — Ambulatory Visit (HOSPITAL_COMMUNITY): Admission: RE | Admit: 2019-02-01 | Payer: Medicare Other | Source: Ambulatory Visit

## 2019-02-02 ENCOUNTER — Encounter: Payer: Medicare Other | Admitting: Vascular Surgery

## 2019-02-02 ENCOUNTER — Encounter: Payer: Self-pay | Admitting: Family

## 2019-02-03 ENCOUNTER — Emergency Department (HOSPITAL_COMMUNITY): Payer: Medicare Other

## 2019-02-03 ENCOUNTER — Encounter (HOSPITAL_COMMUNITY): Payer: Self-pay

## 2019-02-03 ENCOUNTER — Inpatient Hospital Stay (HOSPITAL_COMMUNITY)
Admission: EM | Admit: 2019-02-03 | Discharge: 2019-02-06 | DRG: 305 | Disposition: A | Discharge status: Home Health | Payer: Medicare Other | Attending: Internal Medicine | Admitting: Internal Medicine

## 2019-02-03 ENCOUNTER — Other Ambulatory Visit: Payer: Self-pay

## 2019-02-03 DIAGNOSIS — J449 Chronic obstructive pulmonary disease, unspecified: Secondary | ICD-10-CM | POA: Diagnosis not present

## 2019-02-03 DIAGNOSIS — Z881 Allergy status to other antibiotic agents status: Secondary | ICD-10-CM | POA: Diagnosis not present

## 2019-02-03 DIAGNOSIS — R252 Cramp and spasm: Secondary | ICD-10-CM | POA: Diagnosis present

## 2019-02-03 DIAGNOSIS — Z9981 Dependence on supplemental oxygen: Secondary | ICD-10-CM | POA: Diagnosis not present

## 2019-02-03 DIAGNOSIS — J441 Chronic obstructive pulmonary disease with (acute) exacerbation: Secondary | ICD-10-CM | POA: Diagnosis not present

## 2019-02-03 DIAGNOSIS — I5030 Unspecified diastolic (congestive) heart failure: Secondary | ICD-10-CM

## 2019-02-03 DIAGNOSIS — Z7951 Long term (current) use of inhaled steroids: Secondary | ICD-10-CM

## 2019-02-03 DIAGNOSIS — I509 Heart failure, unspecified: Secondary | ICD-10-CM | POA: Diagnosis not present

## 2019-02-03 DIAGNOSIS — Z20828 Contact with and (suspected) exposure to other viral communicable diseases: Secondary | ICD-10-CM | POA: Diagnosis present

## 2019-02-03 DIAGNOSIS — I480 Paroxysmal atrial fibrillation: Secondary | ICD-10-CM | POA: Diagnosis not present

## 2019-02-03 DIAGNOSIS — I10 Essential (primary) hypertension: Secondary | ICD-10-CM | POA: Diagnosis present

## 2019-02-03 DIAGNOSIS — F419 Anxiety disorder, unspecified: Secondary | ICD-10-CM | POA: Diagnosis present

## 2019-02-03 DIAGNOSIS — I5033 Acute on chronic diastolic (congestive) heart failure: Secondary | ICD-10-CM | POA: Diagnosis not present

## 2019-02-03 DIAGNOSIS — R0600 Dyspnea, unspecified: Secondary | ICD-10-CM | POA: Diagnosis not present

## 2019-02-03 DIAGNOSIS — I2510 Atherosclerotic heart disease of native coronary artery without angina pectoris: Secondary | ICD-10-CM | POA: Diagnosis present

## 2019-02-03 DIAGNOSIS — I160 Hypertensive urgency: Principal | ICD-10-CM | POA: Diagnosis present

## 2019-02-03 DIAGNOSIS — I110 Hypertensive heart disease with heart failure: Secondary | ICD-10-CM | POA: Diagnosis not present

## 2019-02-03 DIAGNOSIS — I248 Other forms of acute ischemic heart disease: Secondary | ICD-10-CM | POA: Diagnosis not present

## 2019-02-03 DIAGNOSIS — Z90710 Acquired absence of both cervix and uterus: Secondary | ICD-10-CM

## 2019-02-03 DIAGNOSIS — K219 Gastro-esophageal reflux disease without esophagitis: Secondary | ICD-10-CM | POA: Diagnosis not present

## 2019-02-03 DIAGNOSIS — Z79899 Other long term (current) drug therapy: Secondary | ICD-10-CM

## 2019-02-03 DIAGNOSIS — J45909 Unspecified asthma, uncomplicated: Secondary | ICD-10-CM | POA: Diagnosis present

## 2019-02-03 DIAGNOSIS — Z21 Asymptomatic human immunodeficiency virus [HIV] infection status: Secondary | ICD-10-CM | POA: Diagnosis not present

## 2019-02-03 DIAGNOSIS — Z825 Family history of asthma and other chronic lower respiratory diseases: Secondary | ICD-10-CM

## 2019-02-03 DIAGNOSIS — G4733 Obstructive sleep apnea (adult) (pediatric): Secondary | ICD-10-CM | POA: Diagnosis not present

## 2019-02-03 DIAGNOSIS — Z86711 Personal history of pulmonary embolism: Secondary | ICD-10-CM | POA: Diagnosis not present

## 2019-02-03 DIAGNOSIS — E8779 Other fluid overload: Secondary | ICD-10-CM | POA: Diagnosis present

## 2019-02-03 DIAGNOSIS — I4892 Unspecified atrial flutter: Secondary | ICD-10-CM | POA: Diagnosis not present

## 2019-02-03 DIAGNOSIS — Z8249 Family history of ischemic heart disease and other diseases of the circulatory system: Secondary | ICD-10-CM

## 2019-02-03 DIAGNOSIS — I712 Thoracic aortic aneurysm, without rupture: Secondary | ICD-10-CM | POA: Diagnosis present

## 2019-02-03 DIAGNOSIS — E785 Hyperlipidemia, unspecified: Secondary | ICD-10-CM | POA: Diagnosis not present

## 2019-02-03 DIAGNOSIS — Z7901 Long term (current) use of anticoagulants: Secondary | ICD-10-CM

## 2019-02-03 DIAGNOSIS — Z8349 Family history of other endocrine, nutritional and metabolic diseases: Secondary | ICD-10-CM

## 2019-02-03 DIAGNOSIS — Z9114 Patient's other noncompliance with medication regimen: Secondary | ICD-10-CM

## 2019-02-03 DIAGNOSIS — Z888 Allergy status to other drugs, medicaments and biological substances status: Secondary | ICD-10-CM

## 2019-02-03 DIAGNOSIS — R0602 Shortness of breath: Secondary | ICD-10-CM

## 2019-02-03 DIAGNOSIS — R011 Cardiac murmur, unspecified: Secondary | ICD-10-CM | POA: Diagnosis not present

## 2019-02-03 DIAGNOSIS — Z833 Family history of diabetes mellitus: Secondary | ICD-10-CM

## 2019-02-03 LAB — COMPREHENSIVE METABOLIC PANEL
ALT: 20 U/L (ref 0–44)
AST: 26 U/L (ref 15–41)
Albumin: 3.9 g/dL (ref 3.5–5.0)
Alkaline Phosphatase: 69 U/L (ref 38–126)
Anion gap: 9 (ref 5–15)
BUN: 20 mg/dL (ref 8–23)
CO2: 30 mmol/L (ref 22–32)
Calcium: 10 mg/dL (ref 8.9–10.3)
Chloride: 100 mmol/L (ref 98–111)
Creatinine, Ser: 1.42 mg/dL — ABNORMAL HIGH (ref 0.44–1.00)
GFR calc Af Amer: 45 mL/min — ABNORMAL LOW (ref >60)
GFR calc non Af Amer: 39 mL/min — ABNORMAL LOW (ref >60)
Glucose, Bld: 113 mg/dL — ABNORMAL HIGH (ref 70–99)
Potassium: 3.9 mmol/L (ref 3.5–5.1)
Sodium: 139 mmol/L (ref 135–145)
Total Bilirubin: 0.5 mg/dL (ref 0.3–1.2)
Total Protein: 7.2 g/dL (ref 6.5–8.1)

## 2019-02-03 LAB — CBC WITH DIFFERENTIAL/PLATELET
Abs Immature Granulocytes: 0.02 10*3/uL (ref 0.00–0.07)
Basophils Absolute: 0 10*3/uL (ref 0.0–0.1)
Basophils Relative: 0 %
Eosinophils Absolute: 0.1 10*3/uL (ref 0.0–0.5)
Eosinophils Relative: 2 %
HCT: 38.2 % (ref 36.0–46.0)
Hemoglobin: 12.1 g/dL (ref 12.0–15.0)
Immature Granulocytes: 0 %
Lymphocytes Relative: 24 %
Lymphs Abs: 1.4 10*3/uL (ref 0.7–4.0)
MCH: 29.8 pg (ref 26.0–34.0)
MCHC: 31.7 g/dL (ref 30.0–36.0)
MCV: 94.1 fL (ref 80.0–100.0)
Monocytes Absolute: 0.4 10*3/uL (ref 0.1–1.0)
Monocytes Relative: 7 %
Neutro Abs: 3.9 10*3/uL (ref 1.7–7.7)
Neutrophils Relative %: 67 %
Platelets: 174 10*3/uL (ref 150–400)
RBC: 4.06 MIL/uL (ref 3.87–5.11)
RDW: 13.1 % (ref 11.5–15.5)
WBC: 5.9 10*3/uL (ref 4.0–10.5)
nRBC: 0 % (ref 0.0–0.2)

## 2019-02-03 LAB — TROPONIN I
Troponin I: <0.03 ng/mL (ref <0.03)
Troponin I: 0.04 ng/mL (ref <0.03)

## 2019-02-03 LAB — GLUCOSE, CAPILLARY: Glucose-Capillary: 217 mg/dL — ABNORMAL HIGH (ref 70–99)

## 2019-02-03 LAB — SARS CORONAVIRUS 2: SARS Coronavirus 2: NOT DETECTED

## 2019-02-03 LAB — BRAIN NATRIURETIC PEPTIDE: B Natriuretic Peptide: 51.3 pg/mL (ref 0.0–100.0)

## 2019-02-03 MED ORDER — HYDRALAZINE HCL 20 MG/ML IJ SOLN
10.0000 mg | Freq: Once | INTRAMUSCULAR | Status: AC
  Administered 2019-02-03: 10 mg via INTRAVENOUS
  Filled 2019-02-03: qty 1

## 2019-02-03 MED ORDER — SENNOSIDES-DOCUSATE SODIUM 8.6-50 MG PO TABS
1.0000 | ORAL_TABLET | Freq: Every evening | ORAL | Status: DC | PRN
  Administered 2019-02-04 – 2019-02-05 (×2): 1 via ORAL
  Filled 2019-02-03 (×2): qty 1

## 2019-02-03 MED ORDER — FLUTICASONE FUROATE-VILANTEROL 100-25 MCG/INH IN AEPB
1.0000 | INHALATION_SPRAY | Freq: Every day | RESPIRATORY_TRACT | Status: DC
  Administered 2019-02-06: 1 via RESPIRATORY_TRACT
  Filled 2019-02-03: qty 28

## 2019-02-03 MED ORDER — LORAZEPAM 2 MG/ML IJ SOLN
0.5000 mg | Freq: Once | INTRAMUSCULAR | Status: AC
  Administered 2019-02-03: 0.5 mg via INTRAVENOUS
  Filled 2019-02-03: qty 1

## 2019-02-03 MED ORDER — RIVAROXABAN 20 MG PO TABS: 20.0000 mg | ORAL_TABLET | Freq: Every day | ORAL | Status: DC

## 2019-02-03 MED ORDER — DILTIAZEM HCL ER COATED BEADS 240 MG PO CP24
240.0000 mg | ORAL_CAPSULE | Freq: Every day | ORAL | Status: DC
  Administered 2019-02-03 – 2019-02-06 (×4): 240 mg via ORAL
  Filled 2019-02-03 (×4): qty 1

## 2019-02-03 MED ORDER — ALBUTEROL SULFATE HFA 108 (90 BASE) MCG/ACT IN AERS
1.0000 | INHALATION_SPRAY | Freq: Once | RESPIRATORY_TRACT | Status: AC
  Administered 2019-02-03: 2 via RESPIRATORY_TRACT
  Filled 2019-02-03: qty 6.7

## 2019-02-03 MED ORDER — ALBUTEROL SULFATE HFA 108 (90 BASE) MCG/ACT IN AERS: 1.0000 | INHALATION_SPRAY | Freq: Four times a day (QID) | RESPIRATORY_TRACT | Status: DC | PRN

## 2019-02-03 MED ORDER — NITROGLYCERIN IN D5W 200-5 MCG/ML-% IV SOLN
0.0000 ug/min | INTRAVENOUS | Status: DC
  Administered 2019-02-03: 5 ug/min via INTRAVENOUS
  Filled 2019-02-03: qty 250

## 2019-02-03 MED ORDER — MORPHINE SULFATE (PF) 4 MG/ML IV SOLN
4.0000 mg | Freq: Once | INTRAVENOUS | Status: AC
  Administered 2019-02-03: 4 mg via INTRAVENOUS
  Filled 2019-02-03: qty 1

## 2019-02-03 MED ORDER — ALBUTEROL SULFATE (2.5 MG/3ML) 0.083% IN NEBU
2.5000 mg | INHALATION_SOLUTION | Freq: Four times a day (QID) | RESPIRATORY_TRACT | Status: DC | PRN
  Administered 2019-02-05: 2.5 mg via RESPIRATORY_TRACT
  Filled 2019-02-03: qty 3

## 2019-02-03 MED ORDER — ACETAMINOPHEN 650 MG RE SUPP: 650.0000 mg | Freq: Four times a day (QID) | RECTAL | Status: DC | PRN

## 2019-02-03 MED ORDER — FLUTICASONE PROPIONATE 50 MCG/ACT NA SUSP: 2.0000 | Freq: Every day | NASAL | Status: DC | PRN

## 2019-02-03 MED ORDER — ACETAMINOPHEN 325 MG PO TABS
650.0000 mg | ORAL_TABLET | Freq: Four times a day (QID) | ORAL | Status: DC | PRN
  Administered 2019-02-03 – 2019-02-05 (×2): 650 mg via ORAL
  Filled 2019-02-03 (×2): qty 2

## 2019-02-03 MED ORDER — MONTELUKAST SODIUM 10 MG PO TABS
10.0000 mg | ORAL_TABLET | Freq: Every day | ORAL | Status: DC
  Administered 2019-02-03 – 2019-02-05 (×3): 10 mg via ORAL
  Filled 2019-02-03 (×3): qty 1

## 2019-02-03 MED ORDER — FUROSEMIDE 20 MG PO TABS: 40.0000 mg | ORAL_TABLET | Freq: Every day | ORAL | Status: DC

## 2019-02-03 MED ORDER — FUROSEMIDE 10 MG/ML IJ SOLN
60.0000 mg | Freq: Once | INTRAMUSCULAR | Status: AC
  Administered 2019-02-03: 60 mg via INTRAVENOUS
  Filled 2019-02-03: qty 6

## 2019-02-03 MED ORDER — CLONIDINE HCL 0.1 MG PO TABS
0.1000 mg | ORAL_TABLET | Freq: Two times a day (BID) | ORAL | Status: DC
  Administered 2019-02-03 – 2019-02-06 (×6): 0.1 mg via ORAL
  Filled 2019-02-03 (×6): qty 1

## 2019-02-03 MED ORDER — ALBUTEROL (5 MG/ML) CONTINUOUS INHALATION SOLN
10.0000 mg/h | INHALATION_SOLUTION | RESPIRATORY_TRACT | Status: AC
  Administered 2019-02-03: 10 mg/h via RESPIRATORY_TRACT
  Filled 2019-02-03: qty 20

## 2019-02-03 MED ORDER — HEPARIN SODIUM (PORCINE) 5000 UNIT/ML IJ SOLN: 5000.0000 [IU] | Freq: Three times a day (TID) | INTRAMUSCULAR | Status: DC

## 2019-02-03 MED ORDER — PROMETHAZINE HCL 25 MG PO TABS: 12.5000 mg | ORAL_TABLET | Freq: Four times a day (QID) | ORAL | Status: DC | PRN

## 2019-02-03 MED ORDER — PANTOPRAZOLE SODIUM 40 MG PO TBEC
40.0000 mg | DELAYED_RELEASE_TABLET | Freq: Every day | ORAL | Status: DC
  Administered 2019-02-04 – 2019-02-06 (×3): 40 mg via ORAL
  Filled 2019-02-03 (×3): qty 1

## 2019-02-03 MED ORDER — BICTEGRAVIR-EMTRICITAB-TENOFOV 50-200-25 MG PO TABS
1.0000 | ORAL_TABLET | Freq: Every day | ORAL | Status: DC
  Administered 2019-02-04 – 2019-02-06 (×3): 1 via ORAL
  Filled 2019-02-03 (×4): qty 1

## 2019-02-03 MED ORDER — MELATONIN 5 MG PO TABS: 5.0000 mg | ORAL_TABLET | Freq: Every evening | ORAL | Status: DC | PRN

## 2019-02-03 MED ORDER — METHYLPREDNISOLONE SODIUM SUCC 125 MG IJ SOLR
125.0000 mg | Freq: Once | INTRAMUSCULAR | Status: AC
  Administered 2019-02-03: 125 mg via INTRAVENOUS
  Filled 2019-02-03: qty 2

## 2019-02-03 MED ORDER — MELATONIN 3 MG PO TABS
3.0000 mg | ORAL_TABLET | Freq: Every evening | ORAL | Status: DC | PRN
  Administered 2019-02-03 – 2019-02-05 (×2): 3 mg via ORAL
  Filled 2019-02-03 (×4): qty 1

## 2019-02-03 NOTE — ED Provider Notes (Signed)
Medical screening examination/treatment/procedure(s) were conducted as a shared visit with non-physician practitioner(s) and myself.  I personally evaluated the patient during the encounter.  Clinical Impression:   Final diagnoses:  Shortness of breath  COPD exacerbation (HCC)  Respiratory distress Severe hypertension   The patient is a 63 year old female who is HIV positive, she has a history of some severe hypertension currently taking clonidine, she is on Xarelto for atrial fibrillation.  The last echocardiogram on file was from 2018 and showed an ejection fraction of 60 to 65% with grade 1 diastolic dysfunction.  The patient reports that she has had some increasing shortness of breath, she became extremely dyspneic this morning despite the use of albuterol inhalers and feels as though she is just not able to breathe.  On exam she is dyspneic, speaks in short and sentences, is using accessory muscles and has a prolonged expiratory phase.  On exam she does have bilateral edema left greater than right, she has been on diuretics to help get rid of some of this fluid but she still continues to swell.  She is taking her Xarelto and is compliant, she has not been coughing to any significant degree.  She has decreased lung sounds bilaterally, no obvious rales, no obvious JVD, soft nontender abdomen.  Initial blood pressure was severely elevated at over 916 systolic.  The patient will be placed on cardiac monitor, she will be given supplemental oxygen, nitroglycerin drip for what appears to be hypertensive congestive heart failure.  She is agreeable to the work-up and the plan and I anticipate she will need to be admitted to the hospital.  Critical care provider for this patient was likely in congestive heart failure secondary to severe hypertension.  .Critical Care Performed by: Noemi Chapel, MD Authorized by: Noemi Chapel, MD   Critical care provider statement:    Critical care time (minutes):   35   Critical care time was exclusive of:  Separately billable procedures and treating other patients and teaching time   Critical care was necessary to treat or prevent imminent or life-threatening deterioration of the following conditions:  Cardiac failure   Critical care was time spent personally by me on the following activities:  Blood draw for specimens, development of treatment plan with patient or surrogate, discussions with consultants, evaluation of patient's response to treatment, examination of patient, obtaining history from patient or surrogate, ordering and performing treatments and interventions, ordering and review of laboratory studies, ordering and review of radiographic studies, pulse oximetry, re-evaluation of patient's condition and review of old charts    EKG Interpretation  Date/Time:  Friday February 03 2019 07:49:58 EDT Ventricular Rate:  92 PR Interval:    QRS Duration: 81 QT Interval:  368 QTC Calculation: 456 R Axis:   -10 Text Interpretation:  Ectopic atrial rhythm Abnormal R-wave progression, early transition Minimal ST elevation, inferior leads unchanged from July 2019 Confirmed by Noemi Chapel [phone]) on 02/03/2019 7:56:51 AM      ]  Medical screening examination/treatment/procedure(s) were conducted as a shared visit with non-physician practitioner(s) and myself.  I personally evaluated the patient during the encounter.  Clinical Impression:   Final diagnoses:  Shortness of breath  COPD exacerbation ([place])         Noemi Chapel, MD 02/04/19 [phone]

## 2019-02-03 NOTE — ED Notes (Signed)
RT at pt bedside

## 2019-02-03 NOTE — ED Notes (Signed)
portable

## 2019-02-03 NOTE — ED Provider Notes (Signed)
[facility] EMERGENCY DEPARTMENT Provider Note   CSN: [id number] Arrival date & time: 02/03/19  0735    History   Chief Complaint Chief Complaint  Patient presents with   Shortness of Breath    HPI Shannon [name] is a 63 y.o. female history of anemia, HIV (CD4 101 as of 11/03/18), A. fib (currently on Xarelto), COPD, asthma, hypertension who presents for evaluation of worsening shortness of breath.  Reports that about a week ago, she started feeling some sinusitis symptoms.  She states that she started taking some Klonopin and leftover amoxicillin that she had states that she was continuing to have symptoms.  She states that over the last 2 days, her shortness of breath has started increasing.  She felt like she was having difficulty catching her breath, particularly when she walked.  She states that over the course of last night and this morning, shortness of breath became worse, occurring even when she was sitting at rest.  She reports difficulty talking secondary to trouble breathing.  She states that at times, she has felt herself wheezing.  She has tried her at home nebulizers and inhalers with no improvement in symptoms.  She also reports some right-sided chest pain that began yesterday.  She describes the chest pain as sharp in nature.  She states that she does not have any associated diaphoresis or nausea.  Denies any pain with deep inspiration.  Ports associated cough but states it is nonproductive.  No evidence of hemoptysis.  She also has noted some bilateral lower extremity edema, left greater than right.  She states she is on furosemide and has been taking her medications as directed.  She also has history of A. fib and is currently on Xarelto and states she has not missed any doses.  She is not any travel or known sick contacts.  She denies any known COVID-19 exposure.  Patient is not a current smoker.  She has history of hypertension.  Patient denies any fevers,  abdominal pain, nausea/vomiting, numbness/weakness of arms or legs.     The history is provided by the patient.    Past Medical History:  Diagnosis Date   Anemia    Anxiety    HX PANIC ATTACKS   Arthritis    "starting to; in my hands" (07/09/2015)   Asthma    Atrial fibrillation (HCC)    Atrial flutter, paroxysmal (HCC)    Bloated abdomen    CFS (chronic fatigue syndrome)    Chewing difficulty    Chronic asthma with acute exacerbation    "I have chronic asthma all the time; sometimes exacerbations" (07/09/2015)   Chronic lower back pain    COPD (chronic obstructive pulmonary disease) ([place])    Cyst of right kidney    "3 of them; dx'd in ~ 01/2015"   Dyspnea    GERD (gastroesophageal reflux disease)    Heart murmur    History of blood transfusion    "related to my brain surgery I think"   History of pulmonary embolism 07/09/2015   HIV antibody positive ([place])    HIV disease ([place])    Hyperlipidemia    Hypertension    Leg edema    Lipodystrophy    Mild CAD 2013   Palpitations    Pneumonia 07/09/2015   Shingles    Sleep apnea    "never completed part 2 of study; never wore mask" (07/09/2015)   Vitamin B 12 deficiency    Vitamin D deficiency  Patient Active Problem List   Diagnosis Date Noted   Acute bacterial sinusitis 12/01/2018   Allergic sinusitis 12/01/2018   Leg swelling 11/03/2018   Other fatigue 06/30/2018   Shortness of breath on exertion 06/30/2018   Hyperglycemia 06/30/2018   Vitamin D deficiency 06/30/2018   B12 nutritional deficiency 06/30/2018   Chronic anticoagulation 01/03/2018   Constipation 11/03/2017   Allergic rhinitis 05/14/2017   Atrial flutter ([place]) 03/11/2017   CAD (coronary artery disease) 01/28/2017   Obesity (BMI 30-39.9) 01/13/2017   Ascending aorta dilatation ([place]) 01/07/2017   Low back pain radiating to right lower extremity 12/02/2016   Acquired cyst of kidney 12/02/2016    Chronic kidney disease (CKD), stage III (moderate) (HCC) 12/01/2016   Generalized anxiety disorder 10/22/2016   Hypersomnia 10/01/2016   Accessory skin tags 06/10/2016   Nocturnal hypoxemia 05/13/2016   Cervical radiculopathy 04/14/2016   GERD (gastroesophageal reflux disease) 01/02/2016   Vitamin B12 deficiency 10/02/2015   Dyspnea 07/09/2015   Moderate persistent asthma with exacerbation 07/09/2015   HIV disease ([place]) 07/09/2015   Uncontrolled hypertension 07/09/2015    Past Surgical History:  Procedure Laterality Date   ABDOMINAL HYSTERECTOMY     "robotic laparosopic"   [place]   "brain tumor; benign; on top of my brain; got a plate in there"   CARDIAC CATHETERIZATION     TONSILLECTOMY AND ADENOIDECTOMY       OB History    Gravida  0   Para  0   Term  0   Preterm  0   AB  0   Living  0     SAB  0   TAB  0   Ectopic  0   Multiple  0   Live Births  0            Home Medications    Prior to Admission medications   Medication Sig Start Date End Date Taking? Authorizing Provider  acetaminophen (TYLENOL) 325 MG tablet Take 325 mg by mouth every 6 (six) hours as needed (FOR PAIN).   Yes [provider]  albuterol (PROVENTIL) (2.5 MG/3ML) 0.083% nebulizer solution USE 1 VIAL VIA NEBULIZER EVERY 6 HOURS AS NEEDED FOR WHEEZING OR SHORTNESS OF BREATH 09/09/18  Yes [name], MD  Ascorbic Acid (VITAMIN C) 1000 MG tablet Take 1,000 mg by mouth 2 (two) times a week.    Yes [provider]  azelastine (OPTIVAR) 0.05 % ophthalmic solution Place 2 drops into both eyes 2 (two) times daily as needed (for swelling or itching).  02/22/18  Yes [provider]  BIKTARVY 50-200-25 MG TABS tablet TAKE 1 TABLET BY MOUTH DAILY Patient taking differently: 1 tablet daily.  09/30/18  Yes [name], MD  BIOTIN PO Take 1 tablet by mouth daily as needed (for supplementation).    Yes [provider]   budesonide-formoterol (SYMBICORT) 80-4.5 MCG/ACT inhaler Inhale 2 puffs into the lungs 2 (two) times daily as needed (shortness of breath).    Yes [provider]  Cholecalciferol (SM VITAMIN D3) 100 MCG (4000 UT) CAPS Take 1 capsule (4,000 Units total) by mouth daily. 07/28/18  Yes Beasley, Caren D, MD  cloNIDine (CATAPRES) 0.1 MG tablet Take 1 tablet (0.1 mg total) by mouth 2 (two) times daily. 01/19/19  Yes Skeet Latch, MD  cyanocobalamin (,VITAMIN B-12,) 1000 MCG/ML injection ADMINISTER 1 ML(1000 MCG) IN THE MUSCLE EVERY 30 DAYS 11/08/18  Yes Abby Potash, PA-C  diltiazem (CARDIZEM CD) 240 MG 24  hr capsule Take 1 capsule (240 mg total) by mouth daily. 06/30/18  Yes [name], MD  fluticasone (FLONASE) 50 MCG/ACT nasal spray Place 2 sprays into both nostrils daily. Patient taking differently: Place 2 sprays into both nostrils daily as needed for allergies.  12/01/18  Yes [place] Callas, NP  fluticasone furoate-vilanterol (BREO ELLIPTA) 200-25 MCG/INH AEPB Inhale 1 puff into the lungs daily. 09/30/18  Yes Parrett, Tammy S, NP  furosemide (LASIX) 40 MG tablet Take 1 tablet (40 mg total) by mouth daily. 08/26/18 02/03/19 Yes Barrett, Evelene Croon, PA-C  MAGNESIUM PO Take by mouth every other day.   Yes [provider]  Melatonin 5 MG TABS Take 5 mg by mouth at bedtime as needed (for sleep).   Yes [provider]  montelukast (SINGULAIR) 10 MG tablet Take 1 tablet (10 mg total) by mouth at bedtime. Patient taking differently: Take 10 mg by mouth daily as needed (allergies).  12/01/18  Yes Dixon, Melton Krebs, NP  Mouthwashes (BIOTENE DRY MOUTH GENTLE) LIQD Use as directed 1 Dose in the mouth or throat 2 (two) times daily. 09/28/17  Yes Ledell Noss, MD  omeprazole (PRILOSEC) 40 MG capsule Take 1 capsule (40 mg total) by mouth daily. Patient taking differently: Take 40 mg by mouth daily as needed Jerrye Bushy).  06/17/18  Yes Gatha Mayer, MD  OXYGEN Inhale 1.5-2 L into the lungs  continuous.    Yes [provider]  polyethylene glycol (MIRALAX / GLYCOLAX) packet Take 17 g by mouth daily. Patient taking differently: Take 17 g by mouth daily as needed for mild constipation.  03/27/18  Yes Molt, Bethany, DO  potassium chloride SA (K-DUR,KLOR-CON) 20 MEQ tablet Take 20 mEq by mouth every other day.   Yes [provider]  PREVIDENT 5000 SENSITIVE 1.1-5 % PSTE Use daily as directed 01/24/18  Yes [provider]  PROAIR HFA 108 (90 Base) MCG/ACT inhaler INHALE 2 PUFFS INTO THE LUNGS EVERY 4 HOURS AS NEEDED FOR WHEEZING OR SHORTNESS OF BREATH 12/26/18  Yes [name], MD  rivaroxaban (XARELTO) 20 MG TABS tablet Take 1 tablet (20 mg total) by mouth daily with supper. 11/29/18  Yes [name], MD  senna-docusate (SENOKOT-S) 8.6-50 MG tablet Take 1 tablet by mouth at bedtime as needed for mild constipation. 03/26/18  Yes Molt, Bethany, DO  sodium chloride (OCEAN) 0.65 % SOLN nasal spray Place 1 spray into both nostrils as needed for congestion. 04/26/18  Yes Lorella Nimrod, MD  Tetrahydrozoline HCl (VISINE OP) Place 2 drops into both eyes daily as needed (for dry eyes).    Yes [provider]  LORazepam (ATIVAN) 0.5 MG tablet Take 1 tablet (0.5 mg total) by mouth 3 (three) times daily as needed for anxiety. Patient not taking: Reported on 02/03/2019 03/26/18   Molt, Bethany, DO  Misc. Devices (PULSE OXIMETER FOR FINGER) MISC 1 Units by Does not apply route as needed. 01/19/19   Skeet Latch, MD    Family History Family History  Problem Relation Age of Onset   Asthma Mother    Heart failure Mother        cardiomyopathy   Thyroid disease Mother    Heart disease Mother    Obesity Mother    Heart murmur Sister    Heart murmur Brother    Diabetes Sister    Thyroid disease Sister    Heart murmur Sister     Social History Social History   Tobacco Use  Smoking status: Never Smoker   Smokeless tobacco: Never Used  Substance Use  Topics   Alcohol use: Not Currently    Alcohol/week: 0.0 standard drinks    Comment: Rarely.   Drug use: No     Allergies   Tree extract, Augmentin [amoxicillin-pot clavulanate], Lisinopril, and Ciprofloxacin   Review of Systems Review of Systems  Constitutional: Negative for fever.  Respiratory: Positive for cough and shortness of breath.   Cardiovascular: Positive for leg swelling. Negative for chest pain.  Gastrointestinal: Negative for abdominal pain, nausea and vomiting.  Genitourinary: Negative for dysuria and hematuria.  Neurological: Negative for headaches.  All other systems reviewed and are negative.    Physical Exam Updated Vital Signs BP (!) 164/94    Pulse (!) 112    Temp 98.6 F (37 C) (Oral)    Resp 17    Ht 5\' 4"  (1.626 m)    Wt 101.6 kg    SpO2 97%    BMI 38.45 kg/m   Physical Exam Vitals signs and nursing note reviewed.  Constitutional:      Appearance: Normal appearance. She is well-developed.     Comments: Appears uncomfortable but not acute distress.   HENT:     Head: Normocephalic and atraumatic.  Eyes:     General: Lids are normal.     Conjunctiva/sclera: Conjunctivae normal.     Pupils: Pupils are equal, round, and reactive to light.  Neck:     Musculoskeletal: Full passive range of motion without pain.  Cardiovascular:     Rate and Rhythm: Normal rate and regular rhythm.     Pulses: Normal pulses.          Radial pulses are 2+ on the right side and 2+ on the left side.       Dorsalis pedis pulses are 2+ on the right side and 2+ on the left side.     Heart sounds: Normal heart sounds. No murmur. No friction rub. No gallop.   Pulmonary:     Effort: Tachypnea present.     Breath sounds: Normal breath sounds.     Comments: Tachypnea and increased work of breathing noted.  Patient speaking in short sentences.  No evidence of wheezing noted. Abdominal:     Palpations: Abdomen is soft. Abdomen is not rigid.     Tenderness: There is no  abdominal tenderness. There is no guarding.     Comments: Abdomen is soft, non-distended, non-tender. No rigidity, No guarding. No peritoneal signs.  Musculoskeletal: Normal range of motion.     Comments: 2+ pitting edema noted bilateral lower extremities, left slightly greater than right.  Warmth, erythema.  Skin:    General: Skin is warm and dry.     Capillary Refill: Capillary refill takes less than 2 seconds.  Neurological:     Mental Status: She is alert and oriented to person, place, and time.  Psychiatric:        Speech: Speech normal.      ED Treatments / Results  Labs (all labs ordered are listed, but only abnormal results are displayed) Labs Reviewed  COMPREHENSIVE METABOLIC PANEL - Abnormal; Notable for the following components:      Result Value   Glucose, Bld 113 (*)    Creatinine, Ser 1.42 (*)    GFR calc non Af Amer 39 (*)    GFR calc Af Amer 45 (*)    All other components within normal limits  SARS CORONAVIRUS 2  CBC WITH DIFFERENTIAL/PLATELET  BRAIN NATRIURETIC PEPTIDE  TROPONIN I    EKG EKG Interpretation  Date/Time:  Friday February 03 2019 07:49:58 EDT Ventricular Rate:  92 PR Interval:    QRS Duration: 81 QT Interval:  368 QTC Calculation: 456 R Axis:   -10 Text Interpretation:  Ectopic atrial rhythm Abnormal R-wave progression, early transition Minimal ST elevation, inferior leads unchanged from July 2019 Confirmed by Noemi Chapel [phone]) on 02/03/2019 7:56:51 AM   Radiology Dg Chest Portable 1 View  Result Date: 02/03/2019 CLINICAL DATA:  Worsening shortness of breath EXAM: PORTABLE CHEST 1 VIEW COMPARISON:  09/09/2018 FINDINGS: Cardiac shadow is mildly enlarged but stable. Scarring is noted in the left mid lung. No sizable effusion or infiltrate is noted. No bony abnormality is seen. IMPRESSION: Scarring in the left mid lung.  No acute abnormality noted. Electronically Signed   By: Inez Catalina M.D.   On: 02/03/2019 08:28    Procedures .Critical  Care Performed by: Volanda Napoleon, PA-C Authorized by: Volanda Napoleon, PA-C   Critical care provider statement:    Critical care time (minutes):  35   Critical care was necessary to treat or prevent imminent or life-threatening deterioration of the following conditions:  Respiratory failure and cardiac failure   Critical care was time spent personally by me on the following activities:  Discussions with consultants, evaluation of patient's response to treatment, examination of patient, ordering and performing treatments and interventions, ordering and review of laboratory studies, ordering and review of radiographic studies, pulse oximetry, re-evaluation of patient's condition, obtaining history from patient or surrogate and review of old charts   (including critical care time)  Medications Ordered in ED Medications  albuterol (PROVENTIL,VENTOLIN) solution continuous neb (0 mg/hr Nebulization Stopped 02/03/19 1230)  furosemide (LASIX) injection 60 mg (60 mg Intravenous Given 02/03/19 0858)  albuterol (VENTOLIN HFA) 108 (90 Base) MCG/ACT inhaler 1-2 puff (2 puffs Inhalation Given 02/03/19 0856)  methylPREDNISolone sodium succinate (SOLU-MEDROL) 125 mg/2 mL injection 125 mg (125 mg Intravenous Given 02/03/19 0908)  hydrALAZINE (APRESOLINE) injection 10 mg (10 mg Intravenous Given 02/03/19 1055)  hydrALAZINE (APRESOLINE) injection 10 mg (10 mg Intravenous Given 02/03/19 1214)  LORazepam (ATIVAN) injection 0.5 mg (0.5 mg Intravenous Given 02/03/19 1246)  morphine 4 MG/ML injection 4 mg (4 mg Intravenous Given 02/03/19 1359)     Initial Impression / Assessment and Plan / ED Course  I have reviewed the triage vital signs and the nursing notes.  Pertinent labs & imaging results that were available during my care of the patient were reviewed by me and considered in my medical decision making (see chart for details).        63 y.o. F history of HIV, asthma, COPD, hypertension who presents for  evaluation of shortness of breath.  Reports feeling some sinusitis symptoms her last week and states that her last 2 days, she had worsening shortness of breath, cough.  Also developed some right-sided chest pain.  No fevers.  No known COVID-19 exposures.  On initial ED arrival, she is afebrile but blood pressure with systolic blood pressure in the 200s.  She has obvious increased work of breathing and is speaking in short to medium sentences.  Also has some bilateral lower extremity edema, left slightly greater than right.  On exam, no obvious wheezing noted.  Concern for CHF exacerbation versus infectious etiology.  Plan for labs, chest x-ray.  Given significant work of breathing as well as blood pressure, will plan for nitro drip.  Reevaluation.  Patient  slightly more tachypneic and having some more trouble breathing.  O2 sats dropped down to 91% at the lowest.  She was placed on 2 L nasal cannula for comfort.  Troponin normal.  CBC without any significant leukocytosis or anemia.  CMP shows creatinine of 1.42.  BNP is unremarkable.  Chest x-ray negative for any acute infectious etiology.  Evaluation after nitro, Lasix.  Patient reports improvement in breathing.  Seems to be able to talk in longer sentences.  She is still slightly tachypneic improved.  If she sits still she feels slightly better but when she starts moving around, she becomes more tachypneic, increased work of breathing.  She did have improvement after Lasix and nitro is a question of there is component of COPD exacerbation rather than CHF exacerbation.  Will need admission for further continued care.  Discussed patient with Internal Medicine. Will plan for admission.   Inform you that patient was "feeling bad."  Evaluated the patient.  She was concerned about her heart rate being high.  I discussed with her that this is most likely result of the albuterol that she was given.  Vitals otherwise stable.  Repeat EKG stable.  Patient given  some Ativan and pain medications of help.  2:45 PM: Re-paged internal medicine they have not evaluated the patient yet or put a bed request in.  Discussed with internal medicine.  They will Be down to see the patient and put in bed request.  Portions of this note were generated with Dragon dictation software. Dictation errors may occur despite best attempts at proofreading.  Final Clinical Impressions(s) / ED Diagnoses   Final diagnoses:  Shortness of breath  COPD exacerbation [facility])    ED Discharge Orders    None       Desma Mcgregor 02/03/19 1538    Noemi Chapel, MD 02/04/19 [phone]

## 2019-02-03 NOTE — H&P (Signed)
Date: 02/03/2019               Patient Name:  Shannon [name] MRN: [id number]  DOB: [birth year] Age / Sex: 63 y.o., female   PCP: [name], MD         Medical Service: Internal Medicine Teaching Service         Attending Physician: Dr. Rebeca Alert Raynaldo Opitz, MD    First Contact: Dr. Sherry Ruffing Pager: [phone]  Second Contact: Dr. Maricela Bo Pager: [phone]       After Hours (After 5p/  First Contact Pager: [phone]  weekends / holidays): Second Contact Pager: [phone]   Chief Complaint: Worsening shortness of breath  History of Present Illness: This is a 63 year old female with a history of well controlled HIV, COPD, Asthma, HTN, paroxysmal atrial flutter, prior PE, OSA, mild ascending aortic aneurysm, on 1-2L supplemental O2 at home who presented with increasing dyspnea, increased work of breathing, and some chest discomfort.  For the past 2 to 4 weeks she reports that she has been having worsening bilateral lower extremity swelling, worsening orthopnea requiring 4 pillows at night(up from 3), weight gain of at least 5 to 7 pounds over the past week, and occasional chest pain.  She reports that the chest pain is been going on for about 1 week, occurs on her right side, unsure sensation, lasts 2 to 3 seconds, occurs 3 times a day.  She denies any recent sick contacts, no recent travel history due to the coronavirus.  She reports that she has been increasing her inhaler use, uses it up to 8-10 times a day for the past 1 to 2 weeks, reports some improvement when she uses it.  Also reports an episode of sinusitis that occurred about 1 week ago, she uses the Mucinex and nebulizers which she reports helped a little bit.  Patient spoke with her cardiologist on 01/19/2019, on chart review it seems that the area of concern to her lower extremity edema is more attributable to venous insufficiency and encouraged to wear compression socks, she was continued on her diltiazem and Lasix and was recommended to  take the clonidine twice daily however patient was hesitant and would only take it twice daily if blood pressure >130/80.  No other changes at that time.  In the ED she was noted to be afebrile, respiratory rate 17-23, oxygen saturation 93%, improved to 99% on 2 L, tachycardia up to 118, and significant hypertensive up to 197/118.  Labs are significant for creatinine of 1.9, close to her baseline.  WBC was unremarkable.  BNP not elevated at 51.  Initial troponin<0.03, COVID testing negative.  Chest x-ray showed right hilar adenopathy, rotation, no cardiomegaly, cardiac scarring in left middle lung.  Patient was treated for heart failure exacerbation with Lasix 60 mg IV, a COPD exacerbation with albuterol and Solu-Medrol, and for hypertensive urgency with nitroglycerin drip and hydralazine. Patient was admitted to internal [facility]   Meds:  Current Meds  Medication Sig  . acetaminophen (TYLENOL) 325 MG tablet Take 325 mg by mouth every 6 (six) hours as needed (FOR PAIN).  [name] albuterol (PROVENTIL) (2.5 MG/3ML) 0.083% nebulizer solution USE 1 VIAL VIA NEBULIZER EVERY 6 HOURS AS NEEDED FOR WHEEZING OR SHORTNESS OF BREATH  . Ascorbic Acid (VITAMIN C) 1000 MG tablet Take 1,000 mg by mouth 2 (two) times a week.   [name] azelastine (OPTIVAR) 0.05 % ophthalmic solution Place 2 drops into both eyes 2 (two) times daily as needed (  for swelling or itching).   [name] BIKTARVY 50-200-25 MG TABS tablet TAKE 1 TABLET BY MOUTH DAILY (Patient taking differently: 1 tablet daily. )  . BIOTIN PO Take 1 tablet by mouth daily as needed (for supplementation).   . budesonide-formoterol (SYMBICORT) 80-4.5 MCG/ACT inhaler Inhale 2 puffs into the lungs 2 (two) times daily as needed (shortness of breath).   . Cholecalciferol (SM VITAMIN D3) 100 MCG (4000 UT) CAPS Take 1 capsule (4,000 Units total) by mouth daily.  . cloNIDine (CATAPRES) 0.1 MG tablet Take 1 tablet (0.1 mg total) by mouth 2 (two) times daily.  . cyanocobalamin (,VITAMIN  B-12,) 1000 MCG/ML injection ADMINISTER 1 ML(1000 MCG) IN THE MUSCLE EVERY 30 DAYS  . diltiazem (CARDIZEM CD) 240 MG 24 hr capsule Take 1 capsule (240 mg total) by mouth daily.  . fluticasone (FLONASE) 50 MCG/ACT nasal spray Place 2 sprays into both nostrils daily. (Patient taking differently: Place 2 sprays into both nostrils daily as needed for allergies. )  . fluticasone furoate-vilanterol (BREO ELLIPTA) 200-25 MCG/INH AEPB Inhale 1 puff into the lungs daily.  . furosemide (LASIX) 40 MG tablet Take 1 tablet (40 mg total) by mouth daily.  [name] MAGNESIUM PO Take by mouth every other day.  . Melatonin 5 MG TABS Take 5 mg by mouth at bedtime as needed (for sleep).  . montelukast (SINGULAIR) 10 MG tablet Take 1 tablet (10 mg total) by mouth at bedtime. (Patient taking differently: Take 10 mg by mouth daily as needed (allergies). )  . Mouthwashes (BIOTENE DRY MOUTH GENTLE) LIQD Use as directed 1 Dose in the mouth or throat 2 (two) times daily.  [name] omeprazole (PRILOSEC) 40 MG capsule Take 1 capsule (40 mg total) by mouth daily. (Patient taking differently: Take 40 mg by mouth daily as needed Jerrye Bushy). )  . OXYGEN Inhale 1.5-2 L into the lungs continuous.   . polyethylene glycol (MIRALAX / GLYCOLAX) packet Take 17 g by mouth daily. (Patient taking differently: Take 17 g by mouth daily as needed for mild constipation. )  . potassium chloride SA (K-DUR,KLOR-CON) 20 MEQ tablet Take 20 mEq by mouth every other day.  [name] PREVIDENT 5000 SENSITIVE 1.1-5 % PSTE Use daily as directed  . PROAIR HFA 108 (90 Base) MCG/ACT inhaler INHALE 2 PUFFS INTO THE LUNGS EVERY 4 HOURS AS NEEDED FOR WHEEZING OR SHORTNESS OF BREATH  . rivaroxaban (XARELTO) 20 MG TABS tablet Take 1 tablet (20 mg total) by mouth daily with supper.  . senna-docusate (SENOKOT-S) 8.6-50 MG tablet Take 1 tablet by mouth at bedtime as needed for mild constipation.  . sodium chloride (OCEAN) 0.65 % SOLN nasal spray Place 1 spray into both nostrils as needed for  congestion.  . Tetrahydrozoline HCl (VISINE OP) Place 2 drops into both eyes daily as needed (for dry eyes).      Allergies: Allergies as of 02/03/2019 - Review Complete 02/03/2019  Allergen Reaction Noted  . Tree extract Swelling and Other (See Comments) 04/13/2016  . Augmentin [amoxicillin-pot clavulanate] Other (See Comments) 02/03/2019  . Lisinopril Cough 11/03/2018  . Ciprofloxacin Hives 07/09/2015   Past Medical History:  Diagnosis Date  . Anemia   . Anxiety    HX PANIC ATTACKS  . Arthritis    "starting to; in my hands" (07/09/2015)  . Asthma   . Atrial fibrillation ([place])   . Atrial flutter, paroxysmal ([place])   . Bloated abdomen   . CFS (chronic fatigue syndrome)   . Chewing difficulty   . Chronic asthma  with acute exacerbation    "I have chronic asthma all the time; sometimes exacerbations" (07/09/2015)  . Chronic lower back pain   . COPD (chronic obstructive pulmonary disease) ([place])   . Cyst of right kidney    "3 of them; dx'd in ~ 01/2015"  . Dyspnea   . GERD (gastroesophageal reflux disease)   . Heart murmur   . History of blood transfusion    "related to my brain surgery I think"  . History of pulmonary embolism 07/09/2015  . HIV antibody positive ([place])   . HIV disease ([place])   . Hyperlipidemia   . Hypertension   . Leg edema   . Lipodystrophy   . Mild CAD 2013  . Palpitations   . Pneumonia 07/09/2015  . Shingles   . Sleep apnea    "never completed part 2 of study; never wore mask" (07/09/2015)  . Vitamin B 12 deficiency   . Vitamin D deficiency     Family History:  Family History  Problem Relation Age of Onset  . Asthma Mother   . Heart failure Mother        cardiomyopathy  . Thyroid disease Mother   . Heart disease Mother   . Obesity Mother   . Heart murmur Sister   . Heart murmur Brother   . Diabetes Sister   . Thyroid disease Sister   . Heart murmur Sister     Social History: Denies any alcohol use, denies any smoking history.  Lives in  [place] alone, lives by herself.  Review of Systems: A complete ROS was negative except as per HPI.   Physical Exam: Blood pressure (!) 148/95, pulse (!) 114, temperature 98.7 F (37.1 C), temperature source Oral, resp. rate 20, height 5\' 4"  (1.626 m), weight 101.6 kg, SpO2 97 %. Physical Exam  Constitutional: She is oriented to person, place, and time and well-developed, well-nourished, and in no distress.  HENT:  Head: Normocephalic and atraumatic.  Eyes: Pupils are equal, round, and reactive to light. Conjunctivae and EOM are normal.  Neck: Neck supple. No thyromegaly present.  Cardiovascular: Regular rhythm. Exam reveals no gallop and no friction rub.  Murmur (systolic) heard. Tachycardic  Pulmonary/Chest: No respiratory distress. She has wheezes. She has no rales. She exhibits no tenderness.  Minimal increased work of breathing, on 2 L nasal cannula, no significant wheezing on exam  Abdominal: Soft. Bowel sounds are normal. She exhibits no distension. There is no abdominal tenderness.  Musculoskeletal: Normal range of motion.        General: No edema.  Neurological: She is alert and oriented to person, place, and time.  Skin: Skin is warm and dry.  Psychiatric: Mood and affect normal.   EKG: personally reviewed my interpretation is sinus tachycardia, occasional PVC  CXR: personally reviewed my interpretation is rotation, no cardiomegaly, cardiac scarring in left middle lung   Assessment & Plan by Problem: Active Problems:   Hypertensive urgency  This is a 63 year old female with a history of well controlled HIV, COPD, Asthma, HTN, paroxysmal atrial flutter, prior PE, OSA, mild ascending aortic aneurysm, on 1-2L supplemental O2 at home who presented with increasing dyspnea, increased work of breathing, and some chest discomfort.  Labs have been relatively unremarkable with a normal BNP and negative troponin.  She was noted to be hypertensive up to 197/118.  Hypertensive  urgency: No evidence of end organ damage at this time. BP up to 197/118 in the ED, she was started on a hydralazine drip  at that time which brought her blood pressures down to 148/95. She is on clonidine and diltiazem at home.  Patient was started on diltiazem twice daily, however is unclear if that is how she is taking it, she reports that she does not always take all of her medications due to being on "very strong medications" such as Biktarvy and diltiazem.  Patient has had a history of hypertensive urgency in the past requiring hospitalization.  This may be due to medication noncompliance versus an acute exacerbation.  It is unclear what may have come first.  -Resume home clonidine and diltiazem -Continue to monitor  Volume overload likely 2/2 hypertensive urgency: Previous echocardiogram showed a normal EF of 91-50%, grade 1 diastolic dysfunction.  Patient is having worsening shortness of breath, bilateral lower extremity edema, orthopnea, and chest tightness.  Reported a weight gain of 5-7 pounds over 1 week.  Reports that she does not take her Lasix daily only as needed, started taking them daily over the past week. Weight of 101.6 kg today, up from 99.8kg on 5/28. BNP was not elevated at 51. Patient does have significant hypertension and had not been taking her lasix daily.  She is apparently been having lower extremity edema previously and her nifedipine was discontinued due to concern that this was the cause, with no resolution of symptoms.  She has been given lasix 60 mg IV in the ED. She has had net 1.8L out since she has been here.   -Daily weights -Strict I+Os -Consider lasix 40 IV in AM -F/u echocardiogram  Aflutter  She is currently in sinus tachycardia.  Had been converted with flecainide with her cardiologist.  -Continue home diltiazem -continue xarelto   COPD: -She is on Breo daily, Symbicort 2 puffs twice daily, Flonase, and Singulair at home.  She wears 1 to 2L of oxygen at  night.  She reported increased inhaler use over the past week.  She received Solu-Medrol and albuterol in the ED, she does not appear to have any wheezing on exam.  This appears to be more consistent with a mild heart failure exacerbation.  We will resume home medications and monitor symptoms. -Continue Breo (in lieu of home symbicort) -Continue Flonase -Continue singular 10 mg daily -Continue albuterol as needed  HIV: Last HIV RNA not detected, CD4: 640 on 3/12. Follows with [facility].  -Continue Biktarvy  Dispo: Admit patient to Observation with expected length of stay less than 2 midnights.  Signed: Asencion Noble, MD 02/03/2019, 7:04 PM  Pager: [phone]

## 2019-02-03 NOTE — ED Notes (Signed)
Notified ED provider of high blood pressure.

## 2019-02-03 NOTE — ED Notes (Signed)
Pt provided with ED happy meal and cup of water. Okayed by EDP.

## 2019-02-03 NOTE — ED Triage Notes (Addendum)
Pt from home c/o sob x 1 week; denies cough, fever, denies sick contacts; denies CP currently, but says she has had some central and L sided CP x 1-2 weeks, described as pressure; endorses some nausea, denies vomiting, diarrhea; pt also states she has been having numbness in her legs "for a couple of weeks"

## 2019-02-03 NOTE — ED Notes (Signed)
Pt reports "feeling worse"; EKG captured, given to Dr. Sabra Heck. Notified [place], [place].

## 2019-02-03 NOTE — ED Notes (Signed)
ED TO INPATIENT HANDOFF REPORT  ED Nurse Name and Phone #: Percell Locus, RN  S Name/Age/Gender Shannon [name] 63 y.o. female Room/Bed: 037C/037C  Code Status   Code Status: Full Code  Home/SNF/Other Home Patient oriented to: self, place, time and situation Is this baseline? Yes   Triage Complete: Triage complete  Chief Complaint sob  Triage Note Pt from home c/o sob x 1 week; denies cough, fever, denies sick contacts; denies CP currently, but says she has had some central and L sided CP x 1-2 weeks, described as pressure; endorses some nausea, denies vomiting, diarrhea; pt also states she has been having numbness in her legs "for a couple of weeks"   Allergies Allergies  Allergen Reactions  . Tree Extract Swelling and Other (See Comments)    Swelling to eyes  . Augmentin [Amoxicillin-Pot Clavulanate] Other (See Comments)    Headache, dizzy  . Lisinopril Cough    Face/throat swelling  . Ciprofloxacin Hives    Level of Care/Admitting Diagnosis ED Disposition    ED Disposition Condition [facility] Area: [facility] [[id number]]  Level of Care: Telemetry Medical [104]  Covid Evaluation: Screening Protocol (No Symptoms)  Diagnosis: Dyspnea [811914]  Admitting Physician: Oda Kilts [[id number]]  Attending Physician: Oda Kilts [[id number]]  PT Class (Do Not Modify): Observation [104]  PT Acc Code (Do Not Modify): Observation [10022]       B Medical/Surgery History Past Medical History:  Diagnosis Date  . Anemia   . Anxiety    HX PANIC ATTACKS  . Arthritis    "starting to; in my hands" (07/09/2015)  . Asthma   . Atrial fibrillation ([place])   . Atrial flutter, paroxysmal ([place])   . Bloated abdomen   . CFS (chronic fatigue syndrome)   . Chewing difficulty   . Chronic asthma with acute exacerbation    "I have chronic asthma all the time; sometimes exacerbations" (07/09/2015)  . Chronic lower back pain   . COPD (chronic  obstructive pulmonary disease) ([place])   . Cyst of right kidney    "3 of them; dx'd in ~ 01/2015"  . Dyspnea   . GERD (gastroesophageal reflux disease)   . Heart murmur   . History of blood transfusion    "related to my brain surgery I think"  . History of pulmonary embolism 07/09/2015  . HIV antibody positive ([place])   . HIV disease ([place])   . Hyperlipidemia   . Hypertension   . Leg edema   . Lipodystrophy   . Mild CAD 2013  . Palpitations   . Pneumonia 07/09/2015  . Shingles   . Sleep apnea    "never completed part 2 of study; never wore mask" (07/09/2015)  . Vitamin B 12 deficiency   . Vitamin D deficiency    Past Surgical History:  Procedure Laterality Date  . ABDOMINAL HYSTERECTOMY     "robotic laparosopic"  . BRAIN SURGERY  1974   "brain tumor; benign; on top of my brain; got a plate in there"  . CARDIAC CATHETERIZATION    . TONSILLECTOMY AND ADENOIDECTOMY       A IV Location/Drains/Wounds Patient Lines/Drains/Airways Status   Active Line/Drains/Airways    Name:   Placement date:   Placement time:   Site:   Days:   Peripheral IV 10/28/18 Left;Posterior Hand   10/28/18    1210    Hand   98   Peripheral IV 02/03/19 Right Antecubital  02/03/19    0858    Antecubital   less than 1          Intake/Output Last 24 hours  Intake/Output Summary (Last 24 hours) at 02/03/2019 1719 Last data filed at 02/03/2019 1052 Gross per 24 hour  Intake 2.29 ml  Output 650 ml  Net -647.71 ml    Labs/Imaging Results for orders placed or performed during the hospital encounter of 02/03/19 (from the past 48 hour(s))  SARS Coronavirus 2     Status: None   Collection Time: 02/03/19  7:51 AM  Result Value Ref Range   SARS Coronavirus 2 NOT DETECTED NOT DETECTED    Comment: (NOTE) SARS-CoV-2 target nucleic acids are NOT DETECTED. The SARS-CoV-2 RNA is generally detectable in upper and lower respiratory specimens during the acute phase of infection.  Negative  results do not preclude  SARS-CoV-2 infection, do not rule out co-infections with other pathogens, and should not be used as the sole basis for treatment or other patient management decisions.  Negative results must be combined with clinical observations, patient history, and epidemiological information. The expected result is Not Detected. Fact Sheet for Patients: http://www.biofiredefense.com/wp-content/uploads/2020/03/BIOFIRE-COVID -19-patients.pdf Fact Sheet for Healthcare Providers: http://www.biofiredefense.com/wp-content/uploads/2020/03/BIOFIRE-COVID -19-hcp.pdf This test is not yet approved or cleared by the [place] and  has been authorized for detection and/or diagnosis of SARS-CoV-2 by FDA under an Emergency Use Authorization (EUA).  This EUA will remain in effec t (meaning this test can be used) for the duration of  the COVID-19 declaration under Section 564(b)(1) of the Act, 21 U.S.C. section 360bbb-3(b)(1), unless the authorization is terminated or revoked sooner. Performed at [facility], [place] [address] [place], [place] [address]   Comprehensive metabolic panel     Status: Abnormal   Collection Time: 02/03/19  8:13 AM  Result Value Ref Range   Sodium 139 135 - 145 mmol/L   Potassium 3.9 3.5 - 5.1 mmol/L   Chloride 100 98 - 111 mmol/L   CO2 30 22 - 32 mmol/L   Glucose, Bld 113 (H) 70 - 99 mg/dL   BUN 20 8 - 23 mg/dL   Creatinine, Ser 1.42 (H) 0.44 - 1.00 mg/dL   Calcium 10.0 8.9 - 10.3 mg/dL   Total Protein 7.2 6.5 - 8.1 g/dL   Albumin 3.9 3.5 - 5.0 g/dL   AST 26 15 - 41 U/L   ALT 20 0 - 44 U/L   Alkaline Phosphatase 69 38 - 126 U/L   Total Bilirubin 0.5 0.3 - 1.2 mg/dL   GFR calc non Af Amer 39 (L) >60 mL/min   GFR calc Af Amer 45 (L) >60 mL/min   Anion gap 9 5 - 15    Comment: Performed at [facility] [address], [place], [place] [address]  CBC with Differential     Status: None   Collection Time: 02/03/19  8:13 AM  Result Value Ref Range   WBC 5.9  4.0 - 10.5 K/uL   RBC 4.06 3.87 - 5.11 MIL/uL   Hemoglobin 12.1 12.0 - 15.0 g/dL   HCT 38.2 36.0 - 46.0 %   MCV 94.1 80.0 - 100.0 fL   MCH 29.8 26.0 - 34.0 pg   MCHC 31.7 30.0 - 36.0 g/dL   RDW 13.1 11.5 - 15.5 %   Platelets 174 150 - 400 K/uL   nRBC 0.0 0.0 - 0.2 %   Neutrophils Relative % 67 %   Neutro Abs 3.9 1.7 -  7.7 K/uL   Lymphocytes Relative 24 %   Lymphs Abs 1.4 0.7 - 4.0 K/uL   Monocytes Relative 7 %   Monocytes Absolute 0.4 0.1 - 1.0 K/uL   Eosinophils Relative 2 %   Eosinophils Absolute 0.1 0.0 - 0.5 K/uL   Basophils Relative 0 %   Basophils Absolute 0.0 0.0 - 0.1 K/uL   Immature Granulocytes 0 %   Abs Immature Granulocytes 0.02 0.00 - 0.07 K/uL    Comment: Performed at [facility] [address] [place], [place] [address]  Brain natriuretic peptide     Status: None   Collection Time: 02/03/19  8:13 AM  Result Value Ref Range   B Natriuretic Peptide 51.3 0.0 - 100.0 pg/mL    Comment: Performed at [facility] [address] [place], [place] [address]  Troponin I - ONCE - STAT     Status: None   Collection Time: 02/03/19  8:13 AM  Result Value Ref Range   Troponin I <0.03 <0.03 ng/mL    Comment: Performed at [facility] [address], [place], [place] [address]   Dg Chest Portable 1 View  Result Date: 02/03/2019 CLINICAL DATA:  Worsening shortness of breath EXAM: PORTABLE CHEST 1 VIEW COMPARISON:  09/09/2018 FINDINGS: Cardiac shadow is mildly enlarged but stable. Scarring is noted in the left mid lung. No sizable effusion or infiltrate is noted. No bony abnormality is seen. IMPRESSION: Scarring in the left mid lung.  No acute abnormality noted. Electronically Signed   By: Inez Catalina M.D.   On: 02/03/2019 08:28    Pending Labs Unresulted Labs (From admission, onward)    Start     Ordered   02/03/19 1717  Troponin I - Once  Once,   STAT     02/03/19 1716          Vitals/Pain Today's Vitals   02/03/19 1300 02/03/19 1402 02/03/19  1415 02/03/19 1430  BP: (!) 141/91 138/79 (!) 156/81 (!) 164/94  Pulse: (!) 105 (!) 111 (!) 108 (!) 112  Resp: 19 (!) 22 20 17   Temp:      TempSrc:      SpO2: 96% 96% 98% 97%  Weight:      Height:      PainSc:        Isolation Precautions No active isolations  Medications Medications  albuterol (PROVENTIL,VENTOLIN) solution continuous neb (0 mg/hr Nebulization Stopped 02/03/19 1230)  acetaminophen (TYLENOL) tablet 650 mg (has no administration in time range)    Or  acetaminophen (TYLENOL) suppository 650 mg (has no administration in time range)  senna-docusate (Senokot-S) tablet 1 tablet (has no administration in time range)  promethazine (PHENERGAN) tablet 12.5 mg (has no administration in time range)  bictegravir-emtricitabine-tenofovir AF (BIKTARVY) 50-200-25 MG per tablet 1 tablet (has no administration in time range)  fluticasone furoate-vilanterol (BREO ELLIPTA) 100-25 MCG/INH 1 puff (has no administration in time range)  furosemide (LASIX) tablet 40 mg (has no administration in time range)  fluticasone (FLONASE) 50 MCG/ACT nasal spray 2 spray (has no administration in time range)  pantoprazole (PROTONIX) EC tablet 40 mg (has no administration in time range)  montelukast (SINGULAIR) tablet 10 mg (has no administration in time range)  Melatonin TABS 5 mg (has no administration in time range)  rivaroxaban (XARELTO) tablet 20 mg (has no administration in time range)  albuterol (VENTOLIN HFA) 108 (90 Base) MCG/ACT inhaler 1-2 puff (has no administration in time range)  furosemide (LASIX)  injection 60 mg (60 mg Intravenous Given 02/03/19 0858)  albuterol (VENTOLIN HFA) 108 (90 Base) MCG/ACT inhaler 1-2 puff (2 puffs Inhalation Given 02/03/19 0856)  methylPREDNISolone sodium succinate (SOLU-MEDROL) 125 mg/2 mL injection 125 mg (125 mg Intravenous Given 02/03/19 0908)  hydrALAZINE (APRESOLINE) injection 10 mg (10 mg Intravenous Given 02/03/19 1055)  hydrALAZINE (APRESOLINE) injection 10  mg (10 mg Intravenous Given 02/03/19 1214)  LORazepam (ATIVAN) injection 0.5 mg (0.5 mg Intravenous Given 02/03/19 1246)  morphine 4 MG/ML injection 4 mg (4 mg Intravenous Given 02/03/19 1359)    Mobility non-ambulatory     Focused Assessments Cardiac Assessment Handoff:  Cardiac Rhythm: Normal sinus rhythm Lab Results  Component Value Date   TROPONINI <0.03 02/03/2019   Lab Results  Component Value Date   DDIMER 0.28 11/02/2017       R Recommendations: See Admitting Provider Note  Report given to:   Additional Notes:

## 2019-02-03 NOTE — ED Notes (Signed)
ED Provider at bedside. 

## 2019-02-03 NOTE — ED Notes (Signed)
Admitting providers at pt bedside.

## 2019-02-04 ENCOUNTER — Observation Stay (HOSPITAL_BASED_OUTPATIENT_CLINIC_OR_DEPARTMENT_OTHER): Payer: Medicare Other

## 2019-02-04 DIAGNOSIS — R252 Cramp and spasm: Secondary | ICD-10-CM

## 2019-02-04 DIAGNOSIS — I712 Thoracic aortic aneurysm, without rupture: Secondary | ICD-10-CM

## 2019-02-04 DIAGNOSIS — G4733 Obstructive sleep apnea (adult) (pediatric): Secondary | ICD-10-CM

## 2019-02-04 DIAGNOSIS — R0600 Dyspnea, unspecified: Secondary | ICD-10-CM

## 2019-02-04 DIAGNOSIS — Z7901 Long term (current) use of anticoagulants: Secondary | ICD-10-CM

## 2019-02-04 DIAGNOSIS — R011 Cardiac murmur, unspecified: Secondary | ICD-10-CM

## 2019-02-04 DIAGNOSIS — I5033 Acute on chronic diastolic (congestive) heart failure: Secondary | ICD-10-CM | POA: Diagnosis present

## 2019-02-04 DIAGNOSIS — Z21 Asymptomatic human immunodeficiency virus [HIV] infection status: Secondary | ICD-10-CM

## 2019-02-04 DIAGNOSIS — Z86711 Personal history of pulmonary embolism: Secondary | ICD-10-CM

## 2019-02-04 DIAGNOSIS — Z7951 Long term (current) use of inhaled steroids: Secondary | ICD-10-CM

## 2019-02-04 DIAGNOSIS — I160 Hypertensive urgency: Principal | ICD-10-CM

## 2019-02-04 DIAGNOSIS — I4892 Unspecified atrial flutter: Secondary | ICD-10-CM

## 2019-02-04 DIAGNOSIS — Z79899 Other long term (current) drug therapy: Secondary | ICD-10-CM

## 2019-02-04 DIAGNOSIS — Z9981 Dependence on supplemental oxygen: Secondary | ICD-10-CM

## 2019-02-04 DIAGNOSIS — I110 Hypertensive heart disease with heart failure: Secondary | ICD-10-CM

## 2019-02-04 DIAGNOSIS — J449 Chronic obstructive pulmonary disease, unspecified: Secondary | ICD-10-CM

## 2019-02-04 DIAGNOSIS — Z9114 Patient's other noncompliance with medication regimen: Secondary | ICD-10-CM

## 2019-02-04 LAB — TROPONIN I: Troponin I: 0.03 ng/mL (ref <0.03)

## 2019-02-04 LAB — ECHOCARDIOGRAM COMPLETE
Height: 64 in
Weight: 3571.2 oz

## 2019-02-04 MED ORDER — MUSCLE RUB 10-15 % EX CREA
TOPICAL_CREAM | CUTANEOUS | Status: DC | PRN
  Administered 2019-02-04 (×2): via TOPICAL
  Filled 2019-02-04: qty 85

## 2019-02-04 MED ORDER — RIVAROXABAN 15 MG PO TABS
15.0000 mg | ORAL_TABLET | Freq: Every day | ORAL | Status: DC
  Administered 2019-02-04 – 2019-02-05 (×2): 15 mg via ORAL
  Filled 2019-02-04 (×2): qty 1

## 2019-02-04 MED ORDER — CYCLOBENZAPRINE HCL 5 MG PO TABS
5.0000 mg | ORAL_TABLET | Freq: Once | ORAL | Status: AC | PRN
  Administered 2019-02-04: 5 mg via ORAL
  Filled 2019-02-04: qty 1

## 2019-02-04 MED ORDER — FUROSEMIDE 10 MG/ML IJ SOLN
40.0000 mg | Freq: Once | INTRAMUSCULAR | Status: AC
  Administered 2019-02-04: 40 mg via INTRAVENOUS
  Filled 2019-02-04: qty 4

## 2019-02-04 MED ORDER — CYCLOBENZAPRINE HCL 5 MG PO TABS
5.0000 mg | ORAL_TABLET | Freq: Once | ORAL | Status: AC
  Administered 2019-02-04: 5 mg via ORAL
  Filled 2019-02-04: qty 1

## 2019-02-04 NOTE — Progress Notes (Signed)
  Date: 02/04/2019  Patient name: Shannon [name]  Medical record number: [id number]  Date of birth: [birth year]   I have seen and evaluated this patient and I have discussed the plan of care with the house staff. Please see their note for complete details. I concur with their findings with the following additions/corrections:   63 year old woman with a history of HIV, COPD/asthma, HTN, HFpEF, and OSA on home O2 admitted for about 4 weeks of progressive dyspnea, orthopnea, weight gain, and occasional chest pain.  On my exam today, she has a 2/6 systolic murmur at the left sternal border, JVD up to the mid neck sitting upright, faint bibasilar crackles, and improved but persistent lower extremity edema.  Abdomen is soft and nontender with normal bowel sounds.  1.  Acute on chronic HFpEF: Although inconsistently documented in her chart, she has previously been diagnosed with HFpEF and she has poorly controlled hypertension.  Suspect she has developed acute exacerbation of her HFpEF, likely due to worsening hypertension.  Her BP here has been as high as 197/118. -TTE today to evaluate LVEF and valvular function -Continue diuresis with IV furosemide -Continue BP control as below  2.  Hypertensive urgency: Review of her chart indicates she consistently has difficulty with poorly controlled hypertension and has had multiple changes to her medication regimen.  No evidence of endorgan damage to indicate hypertensive emergency, very slight troponin elevation of 0.04 more likely related to her HFpEF. -Resume home antihypertensives -Responded well to nitroglycerin drip, which is now off, reasonable to use hydralazine as needed for SBP > 180 as afterload reduction should help her cardiac function as well  Lenice Pressman, M.D., Ph.D. 02/04/2019, 3:18 PM

## 2019-02-04 NOTE — Progress Notes (Addendum)
Subjective:  Has dyspnea and denied chest pain. She reports that she is feeling much better today, her legs have improved significantly in her breathing. She reports that she has been having some bilateral leg cramping overnight.   Objective:  Vital signs in last 24 hours: Vitals:   02/03/19 2200 02/04/19 0010 02/04/19 0032 02/04/19 0409  BP:   (!) 158/81 (!) 159/86  Pulse: (!) 115  100 92  Resp:   20 20  Temp:   98.2 F (36.8 C) 97.8 F (36.6 C)  TempSrc:   Oral Oral  SpO2:   97% 98%  Weight:  101.2 kg    Height:        General: Well appearing female, NAD, laying in bed Cardiac: 3/6 systolic murmur worse in 2nd right intercostal space, regular rate, JVD halfway up to mandible Pulmonary: CTABL, normal work of breathing, no wheezing, rhonchi, or rales Abdomen: Soft, non-tender, non-distended, normoactive bowel sounds Extremity:1+ BL LE edema, no wounds noted  Assessment/Plan:  Active Problems:   Hypertensive urgency  This is a 63 year old female with a history of well controlled HIV, COPD, Asthma, HTN, paroxysmal atrial flutter, prior PE, OSA, mild ascending aortic aneurysm, on 1-2L supplemental O2 at home who presented with increasing dyspnea, increased work of breathing, and some chest discomfort.  Labs have been relatively unremarkable with a normal BNP and negative troponin.  She was noted to be hypertensive up to 197/118.   Hypertensive urgency Possibly due to medication non-compliance, she does not always take her medications due to being on the Biktarvy and diltiazem and concerns aboud interactions. Patient's blood pressure has been ranging 150-170s/80-100s. Resumed her home clonidine 0.1 mg BID and diltiazem 240 mg daily. Better controlled today. Troponin was elevated at 0.04, EKG showed no acute findings, will repeat troponins, possibly due to demand ischemia. Patient appears to also have some mild fluid overload on exam, evaluating that with an echocardiogram, she is  having heart failure issues this may be contributing to her hypertension.  -Continue home clonidine 0.1 mg twice daily -Continue diltiazem 240 mg daily -F/u troponin -Continue to monitor  Volume overload likely 2/2 hypertensive urgency: Patient had about 2 L of urinary output over the past 24 hours, weight is slightly elevated to 101 kg from 100 mg, however may have been due to.  She has had improvement in her lower extremity edema however it is still present. JVD noted along with 1+ lower extremity pitting edema on exam.  Ordered an echocardiogram and we will follow-up, previous echocardiogram showed a normal EF of 40-81%, grade 1 diastolic dysfunction. .  Will give additional IV lasix and continue to monitor diuresis.  BNP was not elevated however given her weight gain, worsening shortness of breath, orthopnea, positive JVD she does seem to have some fluid overload.  -Lasix 40 mg IV in a.m. -Daily weights -Strict I's and O's -BMP in AM -If low output in a.m. can consider additional dose of Lasix in p.m.  Bilateral lower extremity cramping: -Electrolytes were within normal limits, she reports that she sometimes gets this at night. May be related to her diuresis.   -Flexeril 5 mg   Aflutter  She is currently in sinus tachycardia.  Had been converted with flecainide with her cardiologist.  -Continue home diltiazem -continue home Xarelto, CrCl is <50, pharmacy suggested decreasing dose of Xeralto  COPD: -She is on Breo daily, Symbicort 2 puffs twice daily, Flonase, and Singulair at home.  She wears 1 to 2L of  oxygen at night.  She reported increased inhaler use over the past week.  She received Solu-Medrol and albuterol in the ED vitals appear to be more consistent with a fluid overload, she does not appear to be in acute COPD exacerbation, will to new her home COPD medications.  -Continue Breo (in lieu of home symbicort) -Continue Flonase -Continue singular 10 mg daily -Continue  albuterol as needed  HIV: Last HIV RNA not detected, CD4: 640 on 3/12. Follows with [facility].   -Continue Biktarvy  FEN: No fluids, replete lytes prn, HH diet VTE ppx: Xeralto Code Status: FULL    Dispo: Anticipated discharge in approximately 1-2 day(s).   Asencion Noble, MD 02/04/2019, 6:38 AM Pager: [phone]

## 2019-02-04 NOTE — Care Management Obs Status (Signed)
[place] NOTIFICATION   Patient Details  Name: Shannon [name] MRN: [id number] Date of Birth: 04-08-1956   Medicare Observation Status Notification Given:  Yes    Carles Collet, RN 02/04/2019, 3:04 PM

## 2019-02-04 NOTE — Progress Notes (Signed)
Pt complaining of cramping in bilateral legs in and HR went up to the 160s. Paged MD and placed an order for a STAT EKG and to give tylenol. Will continue to monitor.

## 2019-02-05 DIAGNOSIS — I480 Paroxysmal atrial fibrillation: Secondary | ICD-10-CM | POA: Diagnosis not present

## 2019-02-05 DIAGNOSIS — Z86711 Personal history of pulmonary embolism: Secondary | ICD-10-CM | POA: Diagnosis not present

## 2019-02-05 DIAGNOSIS — I160 Hypertensive urgency: Secondary | ICD-10-CM | POA: Diagnosis not present

## 2019-02-05 DIAGNOSIS — I248 Other forms of acute ischemic heart disease: Secondary | ICD-10-CM | POA: Diagnosis present

## 2019-02-05 DIAGNOSIS — E8779 Other fluid overload: Secondary | ICD-10-CM | POA: Diagnosis present

## 2019-02-05 DIAGNOSIS — Z9114 Patient's other noncompliance with medication regimen: Secondary | ICD-10-CM | POA: Diagnosis not present

## 2019-02-05 DIAGNOSIS — R0602 Shortness of breath: Secondary | ICD-10-CM | POA: Diagnosis present

## 2019-02-05 DIAGNOSIS — I5033 Acute on chronic diastolic (congestive) heart failure: Secondary | ICD-10-CM | POA: Diagnosis not present

## 2019-02-05 DIAGNOSIS — Z20828 Contact with and (suspected) exposure to other viral communicable diseases: Secondary | ICD-10-CM | POA: Diagnosis present

## 2019-02-05 DIAGNOSIS — Z7951 Long term (current) use of inhaled steroids: Secondary | ICD-10-CM | POA: Diagnosis not present

## 2019-02-05 DIAGNOSIS — Z7901 Long term (current) use of anticoagulants: Secondary | ICD-10-CM | POA: Diagnosis not present

## 2019-02-05 DIAGNOSIS — J449 Chronic obstructive pulmonary disease, unspecified: Secondary | ICD-10-CM | POA: Diagnosis present

## 2019-02-05 DIAGNOSIS — G4733 Obstructive sleep apnea (adult) (pediatric): Secondary | ICD-10-CM | POA: Diagnosis present

## 2019-02-05 DIAGNOSIS — F419 Anxiety disorder, unspecified: Secondary | ICD-10-CM | POA: Diagnosis present

## 2019-02-05 DIAGNOSIS — Z79899 Other long term (current) drug therapy: Secondary | ICD-10-CM | POA: Diagnosis not present

## 2019-02-05 DIAGNOSIS — Z21 Asymptomatic human immunodeficiency virus [HIV] infection status: Secondary | ICD-10-CM | POA: Diagnosis present

## 2019-02-05 DIAGNOSIS — I4892 Unspecified atrial flutter: Secondary | ICD-10-CM | POA: Diagnosis not present

## 2019-02-05 DIAGNOSIS — I2510 Atherosclerotic heart disease of native coronary artery without angina pectoris: Secondary | ICD-10-CM | POA: Diagnosis present

## 2019-02-05 DIAGNOSIS — I712 Thoracic aortic aneurysm, without rupture: Secondary | ICD-10-CM | POA: Diagnosis present

## 2019-02-05 DIAGNOSIS — I110 Hypertensive heart disease with heart failure: Secondary | ICD-10-CM | POA: Diagnosis not present

## 2019-02-05 DIAGNOSIS — E785 Hyperlipidemia, unspecified: Secondary | ICD-10-CM | POA: Diagnosis present

## 2019-02-05 DIAGNOSIS — I10 Essential (primary) hypertension: Secondary | ICD-10-CM | POA: Diagnosis present

## 2019-02-05 DIAGNOSIS — K219 Gastro-esophageal reflux disease without esophagitis: Secondary | ICD-10-CM | POA: Diagnosis present

## 2019-02-05 DIAGNOSIS — J45909 Unspecified asthma, uncomplicated: Secondary | ICD-10-CM | POA: Diagnosis present

## 2019-02-05 DIAGNOSIS — J441 Chronic obstructive pulmonary disease with (acute) exacerbation: Secondary | ICD-10-CM | POA: Diagnosis present

## 2019-02-05 DIAGNOSIS — Z888 Allergy status to other drugs, medicaments and biological substances status: Secondary | ICD-10-CM

## 2019-02-05 DIAGNOSIS — Z881 Allergy status to other antibiotic agents status: Secondary | ICD-10-CM | POA: Diagnosis not present

## 2019-02-05 DIAGNOSIS — R252 Cramp and spasm: Secondary | ICD-10-CM | POA: Diagnosis not present

## 2019-02-05 LAB — BASIC METABOLIC PANEL
Anion gap: 6 (ref 5–15)
BUN: 33 mg/dL — ABNORMAL HIGH (ref 8–23)
CO2: 34 mmol/L — ABNORMAL HIGH (ref 22–32)
Calcium: 9.9 mg/dL (ref 8.9–10.3)
Chloride: 102 mmol/L (ref 98–111)
Creatinine, Ser: 1.6 mg/dL — ABNORMAL HIGH (ref 0.44–1.00)
GFR calc Af Amer: 39 mL/min — ABNORMAL LOW (ref >60)
GFR calc non Af Amer: 34 mL/min — ABNORMAL LOW (ref >60)
Glucose, Bld: 150 mg/dL — ABNORMAL HIGH (ref 70–99)
Potassium: 3.7 mmol/L (ref 3.5–5.1)
Sodium: 142 mmol/L (ref 135–145)

## 2019-02-05 MED ORDER — CYCLOBENZAPRINE HCL 5 MG PO TABS
5.0000 mg | ORAL_TABLET | Freq: Once | ORAL | Status: AC
  Administered 2019-02-05: 5 mg via ORAL
  Filled 2019-02-05: qty 1

## 2019-02-05 MED ORDER — DICLOFENAC SODIUM 1 % TD GEL
2.0000 g | Freq: Three times a day (TID) | TRANSDERMAL | Status: DC | PRN
  Administered 2019-02-05 (×2): 2 g via TOPICAL
  Filled 2019-02-05: qty 100

## 2019-02-05 MED ORDER — LISINOPRIL 40 MG PO TABS: 40.0000 mg | ORAL_TABLET | Freq: Every day | ORAL | Status: DC

## 2019-02-05 MED ORDER — FUROSEMIDE 10 MG/ML IJ SOLN
60.0000 mg | Freq: Once | INTRAMUSCULAR | Status: AC
  Administered 2019-02-05: 60 mg via INTRAVENOUS
  Filled 2019-02-05: qty 6

## 2019-02-05 MED ORDER — AMLODIPINE BESYLATE 2.5 MG PO TABS: 5.0000 mg | ORAL_TABLET | Freq: Every day | ORAL | Status: DC

## 2019-02-05 MED ORDER — SPIRONOLACTONE 25 MG PO TABS
25.0000 mg | ORAL_TABLET | Freq: Every day | ORAL | Status: DC
  Administered 2019-02-05 – 2019-02-06 (×2): 25 mg via ORAL
  Filled 2019-02-05 (×2): qty 1

## 2019-02-05 NOTE — Progress Notes (Addendum)
Patient having 8 out of 10 pain in right arm/shoulder.   Gave patient Heat pack and will order voltaren gel and try tylenol.    MD aware- wants to try tylenol first prior to giving any stronger medications

## 2019-02-05 NOTE — Progress Notes (Addendum)
Patient blood pressure was elevated 181/105 while sitting down.  PT was working with patient- after getting into chair recheck BP- 192/108.     Gave her morning blood pressure medications- will recheck again in 1 hour to see if BP goes down.     Patient also refused her lasix dosage for this morning.   Patient stated- "she knows her body and feels she does not need that high of dose of lasix."  Called MD- made them aware of refusal

## 2019-02-05 NOTE — Evaluation (Addendum)
Physical Therapy Evaluation Patient Details Name: Shannon [name] MRN: [id number] DOB: [birth year] Today's Date: 02/05/2019   History of Present Illness  63 year old female with a history of well controlled HIV, COPD, Asthma, HTN, paroxysmal atrial flutter, prior PE, OSA, mild ascending aortic aneurysm, anxiety, on 1-2L supplemental O2 at home. Admitted with dyspnea, WOB with HTN urgency with volume overload  Clinical Impression  Pt pleasant, talkative and reports feeling weak and tired due to pain from spasms. Pt with education for benefit of mobility and need to not remain supine all day. Pt required encouragement and coaxing to move but ultimately was limited by HTN to only getting to chair with RN present to provide medications. Pt BP in supine with HOB elevated 181/105 and in chair 192/108. Pt with decreased cardiopulmonary function, activity tolerance, balance and strength who will benefit from acute therapy to maximize mobility, safety and function to decrease burden of care. Pt encouraged to be OOB for all meals.     Follow Up Recommendations Home health PT    Equipment Recommendations  None recommended by PT    Recommendations for Other Services       Precautions / Restrictions Precautions Precautions: Fall Precaution Comments: check BP Restrictions Weight Bearing Restrictions: No      Mobility  Bed Mobility Overal bed mobility: Modified Independent             General bed mobility comments: HOb 40 degrees  Transfers Overall transfer level: Needs assistance   Transfers: Sit to/from Stand;Stand Pivot Transfers Sit to Stand: Min guard Stand pivot transfers: Min assist       General transfer comment: pt able to stand from bed but required HHA to pivot to chair. RN present and aware of BP  Ambulation/Gait             General Gait Details: deferred due to HTN  Stairs            Wheelchair Mobility    Modified Rankin (Stroke Patients Only)        Balance Overall balance assessment: Mild deficits observed, not formally tested                                           Pertinent Vitals/Pain Pain Assessment: 0-10 Pain Score: 7  Pain Location: right arm and back Pain Descriptors / Indicators: Cramping;Constant;Aching Pain Intervention(s): Limited activity within patient's tolerance;Repositioned;Monitored during session;Patient requesting pain meds-RN notified    Home Living Family/patient expects to be discharged to:: Private residence Living Arrangements: Alone   Type of Home: House Home Access: Stairs to enter;Level entry     Home Layout: Two level;Bed/bath upstairs Home Equipment: [place] - 2 wheels;Bedside commode      Prior Function Level of Independence: Independent               Hand Dominance        Extremity/Trunk Assessment   Upper Extremity Assessment Upper Extremity Assessment: Generalized weakness    Lower Extremity Assessment Lower Extremity Assessment: Generalized weakness    Cervical / Trunk Assessment Cervical / Trunk Assessment: Normal  Communication   Communication: No difficulties  Cognition Arousal/Alertness: Awake/alert Behavior During Therapy: WFL for tasks assessed/performed Overall Cognitive Status: Within Functional Limits for tasks assessed  General Comments      Exercises     Assessment/Plan    PT Assessment Patient needs continued PT services  PT Problem List Decreased mobility;Decreased activity tolerance;Decreased balance;Cardiopulmonary status limiting activity       PT Treatment Interventions Gait training;Therapeutic exercise;Patient/family education;Functional mobility training;DME instruction;Therapeutic activities;Stair training    PT Goals (Current goals can be found in the Care Plan section)  Acute Rehab PT Goals Patient Stated Goal: be able to return home and get back to  exercising PT Goal Formulation: With patient Time For Goal Achievement: 02/19/19 Potential to Achieve Goals: Fair    Frequency Min 3X/week   Barriers to discharge Decreased caregiver support      Co-evaluation               AM-PAC PT "6 Clicks" Mobility  Outcome Measure Help needed turning from your back to your side while in a flat bed without using bedrails?: None Help needed moving from lying on your back to sitting on the side of a flat bed without using bedrails?: A Little Help needed moving to and from a bed to a chair (including a wheelchair)?: A Little Help needed standing up from a chair using your arms (e.g., wheelchair or bedside chair)?: A Little Help needed to walk in hospital room?: A Little Help needed climbing 3-5 steps with a railing? : A Little 6 Click Score: 19    End of Session   Activity Tolerance: Patient tolerated treatment well Patient left: in chair;with call bell/phone within reach;with nursing/sitter in room Nurse Communication: Mobility status PT Visit Diagnosis: Other abnormalities of gait and mobility (R26.89);Muscle weakness (generalized) (M62.81)    Time: 3016-0109 PT Time Calculation (min) (ACUTE ONLY): 29 min   Charges:   PT Evaluation $PT Eval Moderate Complexity: 1 Mod          [place], PT Acute Rehabilitation Services Pager: [phone] Office: [phone]   Godwin Tedesco B Michaeljoseph Revolorio 02/05/2019, 1:11 PM

## 2019-02-05 NOTE — Progress Notes (Addendum)
   Subjective:   Shannon [name] was seen laying in her bed this morning doing well. She stated that she started having right shoulder pain starting last night for which she received flexeril and stated that it helped her. She denies any chest pain, sob, abdominal pain.   Objective:  Vital signs in last 24 hours: Vitals:   02/05/19 0701 02/05/19 0726 02/05/19 1029 02/05/19 1036  BP: (!) 180/96  (!) 181/105 (!) 192/108  Pulse: 77  80 81  Resp: 18     Temp: 98.2 F (36.8 C)     TempSrc: Oral     SpO2: 91% 94% 97% 97%  Weight: 101.3 kg     Height:       Physical Exam  Constitutional: She appears well-developed and well-nourished. No distress.  HENT:  Head: Normocephalic and atraumatic.  Neck: JVD (mildly distended, 2cm above sternal angle) present.  Cardiovascular: Normal rate and regular rhythm.  Murmur heard. Respiratory: Effort normal and breath sounds normal. No respiratory distress. She has no wheezes.  GI: Soft. Bowel sounds are normal. She exhibits no distension. There is no abdominal tenderness.  Musculoskeletal:        General: No edema.  Neurological: She is alert.  Skin: She is not diaphoretic.  Psychiatric: She has a normal mood and affect. Her behavior is normal. Judgment and thought content normal.   Assessment/Plan:  Principal Problem:   Acute on chronic heart failure with preserved ejection fraction (HFpEF) (HCC) Active Problems:   Hypertensive urgency  Ms. Navia is a 63 y.o female living with HIV, COPD, PAF, prior PE on xarelto, asthma, osa who presented with dyspena, chest discomfort and a blood pressure of 197/118 on admission. She is being treated for hypertensive urgency causing volume overload.   Hypertensive urgency Patient's blood pressures elevated ranging 150-190s/70-100s and need to be better controlled. Troponin at 0.03 throughout hospitalization 2/2 demand ischemia.   -added spironolactone 25mg  qd (did not add acei or arb as patient reports her throat  swelled up when she took lisinopril and is already on ccb) -Continue home clonidine 0.1 mg twice daily -Continue diltiazem 240 mg daily -Will need to be counseled on medication non-adherence on discharge  HFpEF Echo 02/04/2019 showed EF greater than>65%, severely increased left ventricular wall thickness, elevated left atrial pressure, mild stenosis of aortic valve, moderate aortic annular calcification.  Patient put out a total of 1.5 L over the past 24 hours.  She appears to be a dry weight of 101.3 kilograms today, but she continues to have mild JVD and therefore will give 1 additional dose of IV Lasix 60 mg.  -Continue to follow-up with Dr. Oval Linsey (cardiology) outpatient. -Daily weights -Strict I's and O's  Paroxysmal atrial fibrillation Currently in normal sinus rhythm with pulses ranging 70-90s, hemodynamically stable.    -Continue diltiazem -Continue Xarelto 10 mg daily, her dose has been renally adjusted   COPD  -Continue Breo -Continue Flonase -Continue singular 10 mg daily -Continue albuterol as needed  HIV  -Continue Biktarvy daily  Dispo: Anticipated discharge in approximately 0-1 day.   Lars Mage, MD 02/05/2019, 11:02 AM Pager: [phone]

## 2019-02-05 NOTE — Progress Notes (Addendum)
Recheck BP-  BP down to 157/87  MD aware-  Added Spironolactone - given  Patient coughing up yellow/greenish sputum.   Patient thinks she is getting an URI.   She insisted she needed to be started on Antibiotics.   Told patient I will let MD know.    Paged MD-  Will not be starting patient on Antibiotic.

## 2019-02-06 DIAGNOSIS — Z881 Allergy status to other antibiotic agents status: Secondary | ICD-10-CM

## 2019-02-06 DIAGNOSIS — Z91048 Other nonmedicinal substance allergy status: Secondary | ICD-10-CM

## 2019-02-06 MED ORDER — DICLOFENAC SODIUM 1 % TD GEL: 2.0000 g | Freq: Three times a day (TID) | TRANSDERMAL | 0 refills | Status: DC | PRN

## 2019-02-06 MED ORDER — RIVAROXABAN 20 MG PO TABS: 20.0000 mg | ORAL_TABLET | Freq: Every day | ORAL | 1 refills | Status: DC

## 2019-02-06 MED ORDER — CLONIDINE HCL 0.2 MG/24HR TD PTWK
0.2000 mg | MEDICATED_PATCH | TRANSDERMAL | Status: DC
  Administered 2019-02-06: 0.2 mg via TRANSDERMAL
  Filled 2019-02-06: qty 1

## 2019-02-06 MED ORDER — FUROSEMIDE 40 MG PO TABS
40.0000 mg | ORAL_TABLET | Freq: Every day | ORAL | Status: DC
  Administered 2019-02-06: 40 mg via ORAL
  Filled 2019-02-06: qty 1

## 2019-02-06 MED ORDER — CLONIDINE 0.2 MG/24HR TD PTWK: 0.2000 mg | MEDICATED_PATCH | TRANSDERMAL | 0 refills | Status: DC

## 2019-02-06 MED ORDER — SPIRONOLACTONE 25 MG PO TABS: 25.0000 mg | ORAL_TABLET | Freq: Every day | ORAL | 0 refills | Status: DC

## 2019-02-06 MED ORDER — RIVAROXABAN 20 MG PO TABS
20.0000 mg | ORAL_TABLET | Freq: Every day | ORAL | Status: DC
  Administered 2019-02-06: 20 mg via ORAL
  Filled 2019-02-06: qty 1

## 2019-02-06 NOTE — Discharge Instructions (Signed)
Shannon [name],   It has been a pleasure working with you and we are glad you're feeling better. You were hospitalized for shortness of breath due to heart failure. It is important for you to take your medications as prescribed and to not miss any doses. We have made some changes to your medications.   Please start using the clonidine patch 0.2 mg, you will need to change this every week, please remove the first patch before placing the new one Continue taking clonidine orally for now, your PCP will discuss about stopping this at your follow up appointment Start taking spironolactone 25 mg daily  For your shoulder pain you can start using the voltaren gel to help.  Please decrease your xeralto to 15 mg tablets daily  I have sent in prescriptions to your pharmacy.  Follow up with your primary care provider in 1-2 weeks  If your symptoms worsen or you develop new symptoms, please seek medical help whether it is your primary care provider or emergency department.  If you have any questions about this hospitalization please call [phone].

## 2019-02-06 NOTE — TOC Initial Note (Signed)
Transition of Care [facility]) - Initial/Assessment Note    Patient Details  Name: Shannon [name] MRN: [id number] Date of Birth: 31-Oct-1955  [facility] CM/SW Contact:    Bethena Roys, RN Phone Number: 02/06/2019, 4:55 PM  Clinical Narrative:   Pt presented for Acute on Chronic Hear Failure. PTA from home alone. Plan to return home with PT. Medicare.gov list provided and chose [facility]. Referral sent to [facility]. SOC to begin within 24-48 hours post transition.Patient has transport home. Pt has PCP and gets medications without any problems. No further needs from CM at this time.                 Expected Discharge Plan: [place] Barriers to Discharge: No Barriers Identified   Patient Goals and CMS Choice   CMS Medicare.gov Compare Post Acute Care list provided to:: Patient Choice offered to / list presented to : Patient  Expected Discharge Plan and Services Expected Discharge Plan: [place] In-house Referral: NA Discharge Planning Services: CM Consult Post Acute Care Choice: [place] arrangements for the past 2 months: [facility] [place]) Expected Discharge Date: 02/06/19                         HH Arranged: PT HH Agency: [facility] Date [place]: 02/06/19 Time HH Agency Contacted: 22 Representative spoke with at [place]: Tommi Rumps  Prior Living Arrangements/Services Living arrangements for the past 2 months: ([place]) Lives with:: Self Patient language and need for interpreter reviewed:: Yes Do you feel safe going back to the place where you live?: Yes      Need for Family Participation in Patient Care: No (Comment) Care giver support system in place?: No (comment) Current home services: (none) Criminal Activity/Legal Involvement Pertinent to Current Situation/Hospitalization: No - Comment as needed  Activities of Daily Living Home Assistive Devices/Equipment: Walker (specify type) ADL  Screening (condition at time of admission) Patient's cognitive ability adequate to safely complete daily activities?: Yes Is the patient deaf or have difficulty hearing?: No Does the patient have difficulty seeing, even when wearing glasses/contacts?: No Does the patient have difficulty concentrating, remembering, or making decisions?: No Patient able to express need for assistance with ADLs?: Yes Does the patient have difficulty dressing or bathing?: No Independently performs ADLs?: Yes (appropriate for developmental age) Does the patient have difficulty walking or climbing stairs?: Yes Weakness of Legs: Both Weakness of Arms/Hands: None  Permission Sought/Granted Permission sought to share information with : Case Manager Permission granted to share information with : No              Emotional Assessment Appearance:: Appears stated age Attitude/Demeanor/Rapport: Engaged Affect (typically observed): Accepting Orientation: : Oriented to Self, Oriented to  Time, Oriented to Situation Alcohol / Substance Use: Not Applicable Psych Involvement: No (comment)  Admission diagnosis:  Shortness of breath [R06.02] COPD exacerbation ([place]) [J44.1] Patient Active Problem List   Diagnosis Date Noted  . Acute on chronic heart failure with preserved ejection fraction (HFpEF) ([place]) 02/04/2019  . Acute bacterial sinusitis 12/01/2018  . Allergic sinusitis 12/01/2018  . Leg swelling 11/03/2018  . Other fatigue 06/30/2018  . Shortness of breath on exertion 06/30/2018  . Hyperglycemia 06/30/2018  . Vitamin D deficiency 06/30/2018  . B12 nutritional deficiency 06/30/2018  . Chronic anticoagulation 01/03/2018  . Constipation 11/03/2017  . Allergic rhinitis 05/14/2017  . Atrial flutter ([place]) 03/11/2017  . Hypertensive  urgency 01/28/2017  . CAD (coronary artery disease) 01/28/2017  . Obesity (BMI 30-39.9) 01/13/2017  . Ascending aorta dilatation (HCC) 01/07/2017  . Low back pain radiating to  right lower extremity 12/02/2016  . Acquired cyst of kidney 12/02/2016  . Chronic kidney disease (CKD), stage III (moderate) ([place]) 12/01/2016  . Generalized anxiety disorder 10/22/2016  . Hypersomnia 10/01/2016  . Accessory skin tags 06/10/2016  . Nocturnal hypoxemia 05/13/2016  . Cervical radiculopathy 04/14/2016  . GERD (gastroesophageal reflux disease) 01/02/2016  . Vitamin B12 deficiency 10/02/2015  . Dyspnea 07/09/2015  . Moderate persistent asthma with exacerbation 07/09/2015  . HIV disease ([place]) 07/09/2015  . Uncontrolled hypertension 07/09/2015   PCP:  [name], MD Pharmacy:   [facility] [place], [place] [address] Phone: [phone] Fax: [phone]     Social Determinants of Health (SDOH) Interventions    Readmission Risk Interventions No flowsheet data found.

## 2019-02-06 NOTE — Progress Notes (Signed)
Ambulate 258ft.  O2@ Sats 100% on RA.

## 2019-02-06 NOTE — Progress Notes (Signed)
Patient discharged to home on room air, not in distress. Pts, home meds left in a Security bag # [id number]. Called home and cp no answer. Spoke with niece, she said she will let Shannon [name]. Know. Endorsed to night CN.

## 2019-02-06 NOTE — Progress Notes (Signed)
Calculated CrCl using total BW 101kg with Scr 1.6 is CrCl 56.6. Continue Xarelto 20mg  daily as PTA  Devra Stare S. Alford Highland, PharmD, [place] Clinical Staff Pharmacist

## 2019-02-06 NOTE — Progress Notes (Signed)
SATURATION QUALIFICATIONS: (This note is used to comply with regulatory documentation for home oxygen)  Patient Saturations on Room Air at Rest = 95%  Patient Saturations on Room Air while Ambulating = 86%  Patient Saturations on 2Liters of oxygen while Ambulating = 90%  Please briefly explain why patient needs home oxygen:

## 2019-02-06 NOTE — Progress Notes (Signed)
Subjective: She reports being SOB this morning. She is normally on 1.5-2 L supplemental oxygen at home (currently on 2 L). She does believe that she "has been putting out urine" at almost "600 cc's overnight".  Her arm pain significantly improved with voltaren gel.   Objective:  Vital signs in last 24 hours: Vitals:   02/05/19 1938 02/06/19 0004 02/06/19 0553 02/06/19 0604  BP: (!) 145/96 (!) 162/98  (!) 186/78  Pulse: 83 78  68  Resp:  18  18  Temp:  98 F (36.7 C)  98.4 F (36.9 C)  TempSrc:  Oral  Oral  SpO2:  98%  90%  Weight:   101.2 kg   Height:        General: Well appearing female, NAD, laying in bed Cardiac: RRR, systolic murmur Pulmonary: CTABL, no wheezing, rhonchi, rales, no crackles, mildly increased work of breathing Abdomen: Soft, non-tender, non-distended, normoactive BS Extremity: Trace BL LE edema, no wounds    Assessment/Plan:  Principal Problem:   Acute on chronic heart failure with preserved ejection fraction (HFpEF) (HCC) Active Problems:   Dyspnea   Hypertensive urgency  This is a 63 year old female with a history ofwell controlledHIV, COPD, Asthma, HTN,paroxysmal atrial flutter, prior PE, OSA, mild ascending aortic aneurysm,on 1-2L supplemental O2 at home who presented with increasing dyspnea, increased work of breathing, andsome chest discomfort.Labs have been relatively unremarkable with a normal BNP and negative troponin. She was noted to be hypertensive up to 197/118.   Hypertensive urgency Likely due to medication nonadherance. She was resumed on her home clonidine 0.1 mg BID, and diltiazem. Added spironolactone yesterday. BP had some improvement however this morning still elevated. She has not received her BP medications yet this morning but her BP was better controlled last night. If BP continues to improve later this afternoon and pending her response to her home lasix she may be able to be discharged later today.   -Continue  clonidine 0.1 mg BID -Add clonidine 0.2 mg patch, weekly, overlap for 2-3 days with PO clonidine -Continue diltiazem 240 mg daily -Continue spironolactone 25 mg daily -Continue to monitor, if BP better controlled this afternoon may be stable for discharge  Volume overload likely 2/2 hypertensive urgency: Patient received IV lasix 60 mg IV yesterday, she had a recorded output of 300 cc however patient reported that she has been urinating more then tate. Weight at 223 lbs today, similar to prior. No evidence of fluid overload on exam today. She is still having some shortness of breath but appears to be doing better. Echocardiogram showed an EF of >65%, severely increased left ventricular wall thickness, impaired relaxation, elevated mean left atrial pressure, severe LVH and pericardial effusion is circumferential. Likely due to her hypertension causing some cardiac strain, she did have a mildly elevated troponin up to 0.04, no evidence of changes on EKG.   -Resume home lasix 40 mg daily -Daily weights -Strict I+Os -Montior BMP -If patient is doing well later today may be stable for discharge.  Bilateral lower extremity cramping: Improved, likely due to her diuresis. Will continue flexeril.   -Flexeril 5 mg   Aflutter She is currently in sinus tachycardia. Had been converted with flecainide with her cardiologist.  -Continue home diltiazem -continue home Xarelto, CrCl is <50, pharmacy suggested decreasing dose of Xeralto, decreased Xeralto to 15 mg daily   COPD: Does not appear to have COPD exacerbation, no wheezing on exam. Continue home medications.   -Continue Breo(in lieu of home symbicort) -  Continue Flonase -Continue singular 10 mg daily -Continue albuterol as needed  HIV: Last HIV RNAnot detected, CD4: 640 on 3/12. Follows with  infectious disease.  -Continue Biktarvy   FEN: No fluids, replete lytes prn, HH diet  VTE ppx: Lovenox Xeralto Code  Status: FULL   Dispo: Anticipated discharge in approximately 0-1 day(s).   Asencion Noble, MD 02/06/2019, 7:25 AM Pager: [phone]

## 2019-02-06 NOTE — Discharge Summary (Signed)
Name: Shannon [name] MRN: [id number] DOB: 15-Oct-1955 63 y.o. PCP: [name], MD  Date of Admission: 02/03/2019  7:38 AM Date of Discharge: 02/06/2019 Attending Physician: Aldine Contes  Discharge Diagnosis: 1.  Volume overload secondary to hypertensive urgency 2.  Atrial flutter 3.  COPD 4.  HIV 5. BL LE cramping  Discharge Medications: Allergies as of 02/06/2019      Reactions   Tree Extract Swelling, Other (See Comments)   Swelling to eyes   Augmentin [amoxicillin-pot Clavulanate] Other (See Comments)   Headache, dizzy   Lisinopril Cough   Face/throat swelling   Ciprofloxacin Hives      Medication List    STOP taking these medications   LORazepam 0.5 MG tablet Commonly known as: ATIVAN     TAKE these medications   acetaminophen 325 MG tablet Commonly known as: TYLENOL Take 325 mg by mouth every 6 (six) hours as needed (FOR PAIN).   albuterol (2.5 MG/3ML) 0.083% nebulizer solution Commonly known as: PROVENTIL USE 1 VIAL VIA NEBULIZER EVERY 6 HOURS AS NEEDED FOR WHEEZING OR SHORTNESS OF BREATH   ProAir HFA 108 (90 Base) MCG/ACT inhaler Generic drug: albuterol INHALE 2 PUFFS INTO THE LUNGS EVERY 4 HOURS AS NEEDED FOR WHEEZING OR SHORTNESS OF BREATH   azelastine 0.05 % ophthalmic solution Commonly known as: OPTIVAR Place 2 drops into both eyes 2 (two) times daily as needed (for swelling or itching).   Biktarvy 50-200-25 MG Tabs tablet Generic drug: bictegravir-emtricitabine-tenofovir AF TAKE 1 TABLET BY MOUTH DAILY What changed: how to take this Notes to patient: 6/16   Biotene Dry Mouth Gentle Liqd Use as directed 1 Dose in the mouth or throat 2 (two) times daily.   BIOTIN PO Take 1 tablet by mouth daily as needed (for supplementation).   Cholecalciferol 100 MCG (4000 UT) Caps Commonly known as: SM Vitamin D3 Take 1 capsule (4,000 Units total) by mouth daily.   cloNIDine 0.1 MG tablet Commonly known as: Catapres Take 1 tablet (0.1 mg  total) by mouth 2 (two) times daily. Notes to patient: 6/15   cloNIDine 0.2 mg/24hr patch Commonly known as: CATAPRES - Dosed in mg/24 hr Place 1 patch (0.2 mg total) onto the skin once a week. Start taking on: February 13, 2019 Notes to patient: 6/22   cyanocobalamin 1000 MCG/ML injection Commonly known as: (VITAMIN B-12) ADMINISTER 1 ML(1000 MCG) IN THE MUSCLE EVERY 30 DAYS   diclofenac sodium 1 % Gel Commonly known as: VOLTAREN Apply 2 g topically 3 (three) times daily as needed (arm pain).   diltiazem 240 MG 24 hr capsule Commonly known as: CARDIZEM CD Take 1 capsule (240 mg total) by mouth daily. Notes to patient: 6/16   fluticasone 50 MCG/ACT nasal spray Commonly known as: FLONASE Place 2 sprays into both nostrils daily. What changed:   when to take this  reasons to take this   fluticasone furoate-vilanterol 200-25 MCG/INH Aepb Commonly known as: Breo Ellipta Inhale 1 puff into the lungs daily. Notes to patient: 6/16   furosemide 40 MG tablet Commonly known as: LASIX Take 1 tablet (40 mg total) by mouth daily. Notes to patient: 6/16   MAGNESIUM PO Take by mouth every other day.   Melatonin 5 MG Tabs Take 5 mg by mouth at bedtime as needed (for sleep).   montelukast 10 MG tablet Commonly known as: SINGULAIR Take 1 tablet (10 mg total) by mouth at bedtime. What changed:   when to take this  reasons to take  this Notes to patient: 6/15   omeprazole 40 MG capsule Commonly known as: PRILOSEC Take 1 capsule (40 mg total) by mouth daily. What changed:   when to take this  reasons to take this Notes to patient: 6/16   OXYGEN Inhale 1.5-2 L into the lungs continuous.   polyethylene glycol 17 g packet Commonly known as: MIRALAX / GLYCOLAX Take 17 g by mouth daily. What changed:   when to take this  reasons to take this   potassium chloride SA 20 MEQ tablet Commonly known as: K-DUR Take 20 mEq by mouth every other day. Notes to patient: 6/16    PreviDent 5000 Sensitive 1.1-5 % Pste Generic drug: Sod Fluoride-Potassium Nitrate Use daily as directed   Pulse Oximeter For Finger Misc 1 Units by Does not apply route as needed.   rivaroxaban 20 MG Tabs tablet Commonly known as: Xarelto Take 1 tablet (20 mg total) by mouth daily with supper. Notes to patient: 6/16   senna-docusate 8.6-50 MG tablet Commonly known as: Senokot-S Take 1 tablet by mouth at bedtime as needed for mild constipation.   sodium chloride 0.65 % Soln nasal spray Commonly known as: OCEAN Place 1 spray into both nostrils as needed for congestion.   spironolactone 25 MG tablet Commonly known as: ALDACTONE Take 1 tablet (25 mg total) by mouth daily. Notes to patient: 6/16   Symbicort 80-4.5 MCG/ACT inhaler Generic drug: budesonide-formoterol Inhale 2 puffs into the lungs 2 (two) times daily as needed (shortness of breath).   VISINE OP Place 2 drops into both eyes daily as needed (for dry eyes).   vitamin C 1000 MG tablet Take 1,000 mg by mouth 2 (two) times a week.       Disposition and follow-up:   [name] was discharged from [facility] in Stable condition.  At the hospital follow up visit please address:  1.  Hypertensive urgency: We have started her on the clonidine patch 0.2 mg daily, please have stop the clonidine pills on follow up. Also added spironolactone. Continued diltiazem. Please assess BP and adjust as needed.    Volume overload: Weight on discharge was 223 lbs, echo showed EF >65%, severely increased LV wall thickness, impaired relazation, severe LVH and pericardial effusion that was circumferential. Continued home lasix 40 mg on discharge.   BL LE cramping: Added flexeril.   2.  Labs / imaging needed at time of follow-up: CBC, BMP  3.  Pending labs/ test needing follow-up: None  Follow-up Appointments: Follow-up Information    [name], MD Follow up.   Specialty: Internal Medicine Why: Go to  your already scheduled appointment Contact information: [place] [address] [phone]        Skeet Latch, MD .   Specialty: Cardiology Contact information: [address] [place] [address] [phone]        [facility], [facility] Follow up.   Specialty: Home Health Services Why: Physical Therapy- office to call you with an appointment time.  Contact information: [address] [place] [address] [phone]           Hospital Course by problem list: 1.  Volume overload secondary to hypertensive urgency: This is a 63 year old female with a history of well-controlled HIV, COPD, hypertension, paroxysmal atrial flutter, prior PE, OSA, mild ascending aortic aneurysm, on 1 to 2 L supplemental oxygen at home who presented with increasing dyspnea, increased work of breathing, chest discomfort,  orthopnea, and weight gain. She reported that she does not always take her medications at home. She is on Biktarvy for HIV and feels like that is a strong medication so she doesn't like taking her other medications.  Noted to be hypertensive up to 197/118.  BNP was not elevated at 51.  EKG unremarkable. Troponin peaked at 0.04, likely due to demand ischemia. She had a prior echo that showed a normal EF of 60-65% and grade 1 diastolic dysfunction.  Weight was up to 101 kg from 99.8 kg 1 week prior.  Repeat echocardiogram showed a normal EF of greater than 65%, The cavity size was normal, severely increased left ventricular wall thickness, left ventricular diastolic doppler parameters are consistent with  impaired relaxation.  She was diuresed with IV Lasix and had good urinary output. Net total output of 2.2L. Weight remained stable at 101 kg. Blood pressure treated with clonidine, and diltiazem, and Lasix.  Blood pressure remained elevated so spironolactone was added and given her history of medication noncompliance we started her on a clonidine  patch to transition her off the clonipine pills in the future.  Advised her to continue taking the clonidine pills until her follow-up 2 days later, at that time it can be discontinued.  Patient was resumed on her home Lasix at 40 mg daily.   2.  Atrial flutter: Patient was in sinus tach during her admission, she had previously been converted with flecainide by her cardiologist.  We continued her home diltiazem and Xarelto.  Patient remained rate controlled and no changes to her medications were made. 3.  COPD: Continued her on home medications including Brio(of home Symbicort), Flonase, Singulair, albuterol.  She is on 1 to 2 L of oxygen at home.  She did not have any significant wheezing on exam here this appeared to be more related to her volume overload. 4.  HIV: On 3/12 she had a HIV RNA not detected and CD4 of 640. Continued on her home Biktarvy. Follows with [facility].  5. BL LE cramping: Developed following the diuresis, likely due to over diuresis. Added flexeril which she reported helped. Given prescription for flexeril.   Discharge Vitals:   BP (!) 183/98   Pulse 81   Temp 98.4 F (36.9 C) (Oral)   Resp 18   Ht 5\' 4"  (1.626 m)   Wt 101.2 kg   SpO2 (!) 86% Comment: ambulation  BMI 38.28 kg/m   Pertinent Labs, Studies, and Procedures:  CBC Latest Ref Rng & Units 02/03/2019 11/03/2018 06/29/2018  WBC 4.0 - 10.5 K/uL 5.9 5.7 5.8  Hemoglobin 12.0 - 15.0 g/dL 12.1 12.8 12.5  Hematocrit 36.0 - 46.0 % 38.2 38.1 38.6  Platelets 150 - 400 K/uL 174 196 203   BMP Latest Ref Rng & Units 02/05/2019 02/03/2019 11/09/2018  Glucose 70 - 99 mg/dL 150(H) 113(H) 106(H)  BUN 8 - 23 mg/dL 33(H) 20 21  Creatinine 0.44 - 1.00 mg/dL 1.60(H) 1.42(H) 1.30(H)  BUN/Creat Ratio 12 - 28 - - 16  Sodium 135 - 145 mmol/L 142 139 144  Potassium 3.5 - 5.1 mmol/L 3.7 3.9 4.1  Chloride 98 - 111 mmol/L 102 100 98  CO2 22 - 32 mmol/L 34(H) 30 30(H)  Calcium 8.9 - 10.3 mg/dL 9.9 10.0 10.3    02/04/19 Echocardiogram: IMPRESSIONS    1. The left ventricle has hyperdynamic systolic function, with an ejection fraction of >65%. The cavity size was normal. There is severely increased left ventricular wall  thickness. Left ventricular diastolic Doppler parameters are consistent with  impaired relaxation. Elevated mean left atrial pressure.  2. The severe LVH and very hyperdynamic systolic function creates a dynamic intracavitary gradient, peak 61 mmHg.  3. The right ventricle has normal systolic function. The cavity was normal. There is no increase in right ventricular wall thickness.  4. The pericardial effusion is circumferential.  5. Trivial pericardial effusion is present.  6. The aortic valve is tricuspid. Moderate thickening of the aortic valve. Moderate calcification of the aortic valve. Aortic valve regurgitation is mild to moderate by color flow Doppler. Mild stenosis of the aortic valve. Moderate aortic annular  calcification noted.  7. The mitral valve is abnormal. Mild thickening of the mitral valve leaflet. Mild calcification of the mitral valve leaflet. There is mild mitral annular calcification present. No evidence of mitral valve stenosis.  8. The aortic root is normal in size and structure.  9. There is mild dilatation of the ascending aorta measuring 41 mm. 10. Pulmonary hypertension is indeterminant, inadequate TR jet. 11. The interatrial septum was not well visualized.  Discharge Instructions: Discharge Instructions    (HEART FAILURE PATIENTS) Call MD:  Anytime you have any of the following symptoms: 1) 3 pound weight gain in 24 hours or 5 pounds in 1 week 2) shortness of breath, with or without a dry hacking cough 3) swelling in the hands, feet or stomach 4) if you have to sleep on extra pillows at night in order to breathe.   Complete by: As directed    Call MD for:  difficulty breathing, headache or visual disturbances   Complete by: As directed    Call MD for:   extreme fatigue   Complete by: As directed    Call MD for:  hives   Complete by: As directed    Call MD for:  persistant dizziness or light-headedness   Complete by: As directed    Call MD for:  persistant dizziness or light-headedness   Complete by: As directed    Call MD for:  persistant nausea and vomiting   Complete by: As directed    Call MD for:  persistant nausea and vomiting   Complete by: As directed    Call MD for:  redness, tenderness, or signs of infection (pain, swelling, redness, odor or green/yellow discharge around incision site)   Complete by: As directed    Call MD for:  severe uncontrolled pain   Complete by: As directed    Call MD for:  temperature >100.4   Complete by: As directed    Call MD for:  temperature >100.4   Complete by: As directed    Diet - low sodium heart healthy   Complete by: As directed    Diet - low sodium heart healthy   Complete by: As directed    Increase activity slowly   Complete by: As directed    Increase activity slowly   Complete by: As directed       Signed: Asencion Noble, MD 02/07/2019, 3:09 PM   Pager: [phone]

## 2019-02-07 ENCOUNTER — Other Ambulatory Visit: Payer: Self-pay | Admitting: Internal Medicine

## 2019-02-07 DIAGNOSIS — R0609 Other forms of dyspnea: Secondary | ICD-10-CM

## 2019-02-07 DIAGNOSIS — I5030 Unspecified diastolic (congestive) heart failure: Secondary | ICD-10-CM

## 2019-02-07 NOTE — Progress Notes (Signed)
Home health PT orders

## 2019-02-08 ENCOUNTER — Other Ambulatory Visit: Payer: Self-pay

## 2019-02-08 ENCOUNTER — Ambulatory Visit (INDEPENDENT_AMBULATORY_CARE_PROVIDER_SITE_OTHER): Payer: Medicare Other | Admitting: Internal Medicine

## 2019-02-08 ENCOUNTER — Encounter: Payer: Self-pay | Admitting: Internal Medicine

## 2019-02-08 VITALS — BP 156/108 | HR 102 | Temp 98.7°F | Ht 67.0 in | Wt 221.5 lb

## 2019-02-08 DIAGNOSIS — E785 Hyperlipidemia, unspecified: Secondary | ICD-10-CM

## 2019-02-08 DIAGNOSIS — M25511 Pain in right shoulder: Secondary | ICD-10-CM | POA: Diagnosis not present

## 2019-02-08 DIAGNOSIS — I10 Essential (primary) hypertension: Secondary | ICD-10-CM

## 2019-02-08 DIAGNOSIS — N183 Chronic kidney disease, stage 3 unspecified: Secondary | ICD-10-CM

## 2019-02-08 DIAGNOSIS — G4734 Idiopathic sleep related nonobstructive alveolar hypoventilation: Secondary | ICD-10-CM

## 2019-02-08 DIAGNOSIS — J45909 Unspecified asthma, uncomplicated: Secondary | ICD-10-CM

## 2019-02-08 DIAGNOSIS — I5030 Unspecified diastolic (congestive) heart failure: Secondary | ICD-10-CM

## 2019-02-08 DIAGNOSIS — B20 Human immunodeficiency virus [HIV] disease: Secondary | ICD-10-CM

## 2019-02-08 DIAGNOSIS — K0510 Chronic gingivitis, plaque induced: Secondary | ICD-10-CM

## 2019-02-08 DIAGNOSIS — I483 Typical atrial flutter: Secondary | ICD-10-CM

## 2019-02-08 DIAGNOSIS — Z79899 Other long term (current) drug therapy: Secondary | ICD-10-CM

## 2019-02-08 DIAGNOSIS — Z8619 Personal history of other infectious and parasitic diseases: Secondary | ICD-10-CM

## 2019-02-08 DIAGNOSIS — R531 Weakness: Secondary | ICD-10-CM | POA: Diagnosis not present

## 2019-02-08 DIAGNOSIS — I130 Hypertensive heart and chronic kidney disease with heart failure and stage 1 through stage 4 chronic kidney disease, or unspecified chronic kidney disease: Secondary | ICD-10-CM

## 2019-02-08 DIAGNOSIS — I5033 Acute on chronic diastolic (congestive) heart failure: Secondary | ICD-10-CM

## 2019-02-08 DIAGNOSIS — I4892 Unspecified atrial flutter: Secondary | ICD-10-CM

## 2019-02-08 DIAGNOSIS — R0602 Shortness of breath: Secondary | ICD-10-CM

## 2019-02-08 MED ORDER — SPIRONOLACTONE 25 MG PO TABS: 25.0000 mg | ORAL_TABLET | Freq: Every day | ORAL | 11 refills | Status: DC

## 2019-02-08 MED ORDER — VALACYCLOVIR HCL 1 G PO TABS: 1000.0000 mg | ORAL_TABLET | Freq: Two times a day (BID) | ORAL | 0 refills | Status: AC

## 2019-02-08 MED ORDER — FUROSEMIDE 40 MG PO TABS: 40.0000 mg | ORAL_TABLET | Freq: Every day | ORAL | 3 refills | Status: DC

## 2019-02-08 NOTE — Assessment & Plan Note (Signed)
Check Cr today.  Previously stable.

## 2019-02-08 NOTE — Assessment & Plan Note (Signed)
She was tachycardic today, but regular.  She had not yet taken her diltiazem.  Recheck at next visit once she is on her medications regularly.

## 2019-02-08 NOTE — Assessment & Plan Note (Signed)
She is requesting follow up labs.  Last values showed very well controlled HIV.   Plan Check viral load, CD4 Continue biktarvy Follow up with [facility].

## 2019-02-08 NOTE — Assessment & Plan Note (Signed)
We spent a lot of time discussing this today.  She had her clonidine patch on and she had taken a clonidine pill prior to my seeing her.  Her BP remained elevated.  Now that she has been on the patch, I advised her to stop the clonidine pills.  She is also on diltiazem, spironolactone and lasix.  I advised her to take all of these.  She is on K supplementation and we will check a BMET today.  She did not appear aware that she was supposed to be taking spironolactone (did not recognize the name).  We discussed the importance of this medication.   Plan STOP clonidine pills Continue clonidine patch, diltiazem, spironolactone, lasix BMET today Follow up in 2 weeks.

## 2019-02-08 NOTE — Assessment & Plan Note (Signed)
Swelling is much improved.  Breathing is better and she is down to her baseline SOB with using 2L of oxygen at home.  She was surprised that her BP had affected her heart so much.  We discussed how important it will be to keep her BP better controlled.  She notes that she feels "foggy" when her BP is "too low" (more in the controlled range).  We discussed that to control further damage to her heart, she will need to work on getting her BP lower and getting used to taking the medications daily.   Plan Continue lasix 40mg  daily Restart spironolactone 25mg  daily Check BMET today.

## 2019-02-08 NOTE — Progress Notes (Addendum)
Subjective:    Patient ID: Shannon [name], female    DOB: Jan 25, 1956, 63 y.o.   MRN: [id number]  CC: HFU, uncontrolled HTN  HPI  Shannon [name] is a 63 year old woman with uncontrolled HTN, HLD, HIV (well controlled), asthma, hypertensive heart disease who presents for hospital follow up.    She has multiple complaints today including shoulder pain, weakness, gingival swelling, elevated blood pressure, concern about her HIV levels and SOB.   Her shoulder pain is in her right shoulder, lateral side in the muscle.  She cannot remember any injury.  She has pain with cross over of the shoulder and with lifting the shoulder.  She notes that she is worried about arthritis and nerve problems.  Voltaren gel has helped her, but she is worried about what it might be.    Weakness, blood pressure elevation, SOB.  She has chronic SOB which has been ongoing for a long time.  She follows with pulmonary and that group had recommended pulmonary rehab.  She did go for evaluation, but her blood pressure was very elevated any time she went and they would not allow her to work out.  She was recently admitted to the hospital for volume overload related to uncontrolled HTN and diastolic heart failure (due to severe LVH).  Her SOB is mildly better.  Her edema is much improved and almost back to baseline.  She was unaware that her BP could have caused this issue.  She had only taken her clonidine patch this morning and her BP and pulse were elevated.    She had a knot on her arm at the site of an IV which we discussed.  It appears to be improving.  Anticipate it will resolve with symptomatic care.    She further noted that she has developed a burning sensation around her mouth which she feels is very similar to previous herpes zoster outbreaks.  She had a rash on her arm which has improved which she was concerned was a new shingles outbreak.  She would like to do a course of valtrex.    Finally, she noted gingival swelling  and teeth separation in her lower teeth.  She is frustrated and feels that her dentist is not taking this issue seriously.  She has no drainage or increased swelling today, but she feels that her teeth are loose.    She asked for updated HIV labs today in addition to other labwork which was done.    Review of Systems  Constitutional: Positive for activity change and fatigue. Negative for chills.  HENT: Positive for dental problem.   Respiratory: Positive for chest tightness and shortness of breath.   Cardiovascular: Positive for chest pain and leg swelling.  Psychiatric/Behavioral:       Foggy sensation       Objective:   Physical Exam Constitutional:      Appearance: Normal appearance. She is not ill-appearing or diaphoretic.  HENT:     Head: Normocephalic and atraumatic.     Mouth/Throat:     Mouth: Mucous membranes are moist.     Pharynx: Oropharynx is clear. No oropharyngeal exudate or posterior oropharyngeal erythema.     Comments: She has dental implants in her upper gums, she has receding gums in remaining lower teeth.  No abscess or drainage noted.  Cardiovascular:     Rate and Rhythm: Regular rhythm. Tachycardia present.     Pulses: Normal pulses.     Heart sounds: No murmur.  Pulmonary:     Effort: Pulmonary effort is normal. No respiratory distress.  Musculoskeletal:        General: Swelling (She has some 1+ left leg edema to mid calf, she has a slowly healing wound on the left shin near the ankle.  She has thin RLE), tenderness and signs of injury (left shin) present.     Comments: She has tenderness to palpation of the lateral left upper arm.  She has normal ROM, IR/ER.  She has good strength of the RUE in the biceps, triceps and grip strength.    Skin:    General: Skin is warm and dry.     Findings: No erythema.  Neurological:     Mental Status: She is alert and oriented to person, place, and time. Mental status is at baseline.  Psychiatric:        Mood and  Affect: Mood normal.        Behavior: Behavior normal.    BMET, viral load, CD4 today.      Assessment & Plan:  RTC in 2 weeks for blood pressure check.  Herpes Zoster, history - I think her lip tingling may more likely be related to Herpes Simplex.  Will do a 10 day course of valtrex 1gm BID (needs longer course given HIV status)  Gingival swelling - Advised follow up with dentist.

## 2019-02-08 NOTE — Assessment & Plan Note (Signed)
Continue Home O2.  Will fill out paperwork for [employer] today concerning her power.

## 2019-02-08 NOTE — Patient Instructions (Signed)
Shannon [name] -   For your blood pressure - -  Please take  Clonidine patch weekly Diltiazem 240mg  daily Spironolactone 25mg  daily Lasix 40mg  daily  STOP Clonidine pills.    It is very important that you take all of these medications every day in order to control your blood pressure and avoid more fluid accumulation.    For your herpes outbreak  Take Valtrex 1 gram twice a day for 10 days.  Let me know if your symptoms do not get better.    Thank you!  Come back to see me in 2 weeks.

## 2019-02-09 LAB — BMP8+ANION GAP
Anion Gap: 17 mmol/L (ref 10.0–18.0)
BUN/Creatinine Ratio: 23 (ref 12–28)
BUN: 27 mg/dL (ref 8–27)
CO2: 28 mmol/L (ref 20–29)
Calcium: 10.4 mg/dL — ABNORMAL HIGH (ref 8.7–10.3)
Chloride: 97 mmol/L (ref 96–106)
Creatinine, Ser: 1.17 mg/dL — ABNORMAL HIGH (ref 0.57–1.00)
GFR calc Af Amer: 57 mL/min/{1.73_m2} — ABNORMAL LOW (ref >59)
GFR calc non Af Amer: 49 mL/min/{1.73_m2} — ABNORMAL LOW (ref >59)
Glucose: 114 mg/dL — ABNORMAL HIGH (ref 65–99)
Potassium: 4.1 mmol/L (ref 3.5–5.2)
Sodium: 142 mmol/L (ref 134–144)

## 2019-02-09 LAB — T-HELPER CELLS (CD4) COUNT (NOT AT ARMC)
CD4 % Helper T Cell: 39 % (ref 33–65)
CD4 T Cell Abs: 595 /uL (ref 400–1790)

## 2019-02-10 ENCOUNTER — Telehealth: Payer: Self-pay

## 2019-02-10 LAB — HIV-1 RNA QUANT-NO REFLEX-BLD
HIV-1 RNA Viral Load Log: 3.021 log10copy/mL
HIV-1 RNA Viral Load: 1050 copies/mL

## 2019-02-10 NOTE — Telephone Encounter (Signed)
I spoke to [facility] [phone] about this patient. I faxed information to them and they will be able to take patient.Fax:[phone]. They will go out on Monday June 22,2020.

## 2019-02-11 DIAGNOSIS — J45909 Unspecified asthma, uncomplicated: Secondary | ICD-10-CM | POA: Diagnosis not present

## 2019-02-11 DIAGNOSIS — R269 Unspecified abnormalities of gait and mobility: Secondary | ICD-10-CM | POA: Diagnosis not present

## 2019-02-11 DIAGNOSIS — J449 Chronic obstructive pulmonary disease, unspecified: Secondary | ICD-10-CM | POA: Diagnosis not present

## 2019-02-11 DIAGNOSIS — G4733 Obstructive sleep apnea (adult) (pediatric): Secondary | ICD-10-CM | POA: Diagnosis not present

## 2019-02-13 DIAGNOSIS — J449 Chronic obstructive pulmonary disease, unspecified: Secondary | ICD-10-CM | POA: Diagnosis not present

## 2019-02-13 DIAGNOSIS — J4541 Moderate persistent asthma with (acute) exacerbation: Secondary | ICD-10-CM | POA: Diagnosis not present

## 2019-02-13 DIAGNOSIS — I313 Pericardial effusion (noninflammatory): Secondary | ICD-10-CM | POA: Diagnosis not present

## 2019-02-13 DIAGNOSIS — J309 Allergic rhinitis, unspecified: Secondary | ICD-10-CM | POA: Diagnosis not present

## 2019-02-13 DIAGNOSIS — K219 Gastro-esophageal reflux disease without esophagitis: Secondary | ICD-10-CM | POA: Diagnosis not present

## 2019-02-13 DIAGNOSIS — N281 Cyst of kidney, acquired: Secondary | ICD-10-CM | POA: Diagnosis not present

## 2019-02-13 DIAGNOSIS — K5900 Constipation, unspecified: Secondary | ICD-10-CM | POA: Diagnosis not present

## 2019-02-13 DIAGNOSIS — G4733 Obstructive sleep apnea (adult) (pediatric): Secondary | ICD-10-CM | POA: Diagnosis not present

## 2019-02-13 DIAGNOSIS — I5033 Acute on chronic diastolic (congestive) heart failure: Secondary | ICD-10-CM | POA: Diagnosis not present

## 2019-02-13 DIAGNOSIS — I4892 Unspecified atrial flutter: Secondary | ICD-10-CM | POA: Diagnosis not present

## 2019-02-13 DIAGNOSIS — E639 Nutritional deficiency, unspecified: Secondary | ICD-10-CM | POA: Diagnosis not present

## 2019-02-13 DIAGNOSIS — E559 Vitamin D deficiency, unspecified: Secondary | ICD-10-CM | POA: Diagnosis not present

## 2019-02-13 DIAGNOSIS — M5412 Radiculopathy, cervical region: Secondary | ICD-10-CM | POA: Diagnosis not present

## 2019-02-13 DIAGNOSIS — G4710 Hypersomnia, unspecified: Secondary | ICD-10-CM | POA: Diagnosis not present

## 2019-02-13 DIAGNOSIS — N183 Chronic kidney disease, stage 3 (moderate): Secondary | ICD-10-CM | POA: Diagnosis not present

## 2019-02-13 DIAGNOSIS — E785 Hyperlipidemia, unspecified: Secondary | ICD-10-CM | POA: Diagnosis not present

## 2019-02-13 DIAGNOSIS — I77819 Aortic ectasia, unspecified site: Secondary | ICD-10-CM | POA: Diagnosis not present

## 2019-02-13 DIAGNOSIS — I080 Rheumatic disorders of both mitral and aortic valves: Secondary | ICD-10-CM | POA: Diagnosis not present

## 2019-02-13 DIAGNOSIS — I2510 Atherosclerotic heart disease of native coronary artery without angina pectoris: Secondary | ICD-10-CM | POA: Diagnosis not present

## 2019-02-13 DIAGNOSIS — I130 Hypertensive heart and chronic kidney disease with heart failure and stage 1 through stage 4 chronic kidney disease, or unspecified chronic kidney disease: Secondary | ICD-10-CM | POA: Diagnosis not present

## 2019-02-13 DIAGNOSIS — E538 Deficiency of other specified B group vitamins: Secondary | ICD-10-CM | POA: Diagnosis not present

## 2019-02-13 DIAGNOSIS — M25511 Pain in right shoulder: Secondary | ICD-10-CM | POA: Diagnosis not present

## 2019-02-15 ENCOUNTER — Ambulatory Visit (INDEPENDENT_AMBULATORY_CARE_PROVIDER_SITE_OTHER): Payer: Medicare Other | Admitting: Physician Assistant

## 2019-02-15 ENCOUNTER — Encounter (INDEPENDENT_AMBULATORY_CARE_PROVIDER_SITE_OTHER): Payer: Self-pay

## 2019-02-16 ENCOUNTER — Telehealth: Payer: Self-pay | Admitting: Internal Medicine

## 2019-02-16 NOTE — Telephone Encounter (Signed)
Agree. Thanks

## 2019-02-16 NOTE — Telephone Encounter (Signed)
PT request VO for  2x week for 3 weeks and 1x week for 2 weeks for safety, balance, gait and strengthening. VO given, do you agree?

## 2019-02-16 NOTE — Telephone Encounter (Signed)
[facility] PT requesting VO. Please call back.

## 2019-02-20 DIAGNOSIS — J449 Chronic obstructive pulmonary disease, unspecified: Secondary | ICD-10-CM | POA: Diagnosis not present

## 2019-02-20 DIAGNOSIS — E538 Deficiency of other specified B group vitamins: Secondary | ICD-10-CM | POA: Diagnosis not present

## 2019-02-20 DIAGNOSIS — M25511 Pain in right shoulder: Secondary | ICD-10-CM | POA: Diagnosis not present

## 2019-02-20 DIAGNOSIS — E639 Nutritional deficiency, unspecified: Secondary | ICD-10-CM | POA: Diagnosis not present

## 2019-02-20 DIAGNOSIS — N281 Cyst of kidney, acquired: Secondary | ICD-10-CM | POA: Diagnosis not present

## 2019-02-20 DIAGNOSIS — I77819 Aortic ectasia, unspecified site: Secondary | ICD-10-CM | POA: Diagnosis not present

## 2019-02-20 DIAGNOSIS — I313 Pericardial effusion (noninflammatory): Secondary | ICD-10-CM | POA: Diagnosis not present

## 2019-02-20 DIAGNOSIS — M5412 Radiculopathy, cervical region: Secondary | ICD-10-CM | POA: Diagnosis not present

## 2019-02-20 DIAGNOSIS — I5033 Acute on chronic diastolic (congestive) heart failure: Secondary | ICD-10-CM | POA: Diagnosis not present

## 2019-02-20 DIAGNOSIS — K5900 Constipation, unspecified: Secondary | ICD-10-CM | POA: Diagnosis not present

## 2019-02-20 DIAGNOSIS — K219 Gastro-esophageal reflux disease without esophagitis: Secondary | ICD-10-CM | POA: Diagnosis not present

## 2019-02-20 DIAGNOSIS — J4541 Moderate persistent asthma with (acute) exacerbation: Secondary | ICD-10-CM | POA: Diagnosis not present

## 2019-02-20 DIAGNOSIS — G4733 Obstructive sleep apnea (adult) (pediatric): Secondary | ICD-10-CM | POA: Diagnosis not present

## 2019-02-20 DIAGNOSIS — N183 Chronic kidney disease, stage 3 (moderate): Secondary | ICD-10-CM | POA: Diagnosis not present

## 2019-02-20 DIAGNOSIS — I4892 Unspecified atrial flutter: Secondary | ICD-10-CM | POA: Diagnosis not present

## 2019-02-20 DIAGNOSIS — E559 Vitamin D deficiency, unspecified: Secondary | ICD-10-CM | POA: Diagnosis not present

## 2019-02-20 DIAGNOSIS — E785 Hyperlipidemia, unspecified: Secondary | ICD-10-CM | POA: Diagnosis not present

## 2019-02-20 DIAGNOSIS — I2510 Atherosclerotic heart disease of native coronary artery without angina pectoris: Secondary | ICD-10-CM | POA: Diagnosis not present

## 2019-02-20 DIAGNOSIS — I130 Hypertensive heart and chronic kidney disease with heart failure and stage 1 through stage 4 chronic kidney disease, or unspecified chronic kidney disease: Secondary | ICD-10-CM | POA: Diagnosis not present

## 2019-02-20 DIAGNOSIS — J309 Allergic rhinitis, unspecified: Secondary | ICD-10-CM | POA: Diagnosis not present

## 2019-02-20 DIAGNOSIS — I080 Rheumatic disorders of both mitral and aortic valves: Secondary | ICD-10-CM | POA: Diagnosis not present

## 2019-02-20 DIAGNOSIS — G4710 Hypersomnia, unspecified: Secondary | ICD-10-CM | POA: Diagnosis not present

## 2019-02-20 NOTE — Telephone Encounter (Signed)
Opened in error

## 2019-02-21 ENCOUNTER — Ambulatory Visit: Payer: Medicare Other | Admitting: Cardiology

## 2019-02-22 ENCOUNTER — Ambulatory Visit (INDEPENDENT_AMBULATORY_CARE_PROVIDER_SITE_OTHER): Payer: Medicare Other | Admitting: Physician Assistant

## 2019-02-23 ENCOUNTER — Encounter: Payer: Self-pay | Admitting: Internal Medicine

## 2019-02-23 ENCOUNTER — Ambulatory Visit: Payer: Medicare Other | Admitting: Internal Medicine

## 2019-02-23 ENCOUNTER — Ambulatory Visit (INDEPENDENT_AMBULATORY_CARE_PROVIDER_SITE_OTHER): Payer: Medicare Other | Admitting: Internal Medicine

## 2019-02-23 ENCOUNTER — Other Ambulatory Visit: Payer: Self-pay

## 2019-02-23 VITALS — BP 149/95 | HR 83 | Temp 98.2°F | Ht 67.0 in | Wt 230.4 lb

## 2019-02-23 DIAGNOSIS — I110 Hypertensive heart disease with heart failure: Secondary | ICD-10-CM

## 2019-02-23 DIAGNOSIS — I5030 Unspecified diastolic (congestive) heart failure: Secondary | ICD-10-CM

## 2019-02-23 DIAGNOSIS — Z79899 Other long term (current) drug therapy: Secondary | ICD-10-CM

## 2019-02-23 DIAGNOSIS — I10 Essential (primary) hypertension: Secondary | ICD-10-CM

## 2019-02-23 DIAGNOSIS — N183 Chronic kidney disease, stage 3 unspecified: Secondary | ICD-10-CM

## 2019-02-23 DIAGNOSIS — B20 Human immunodeficiency virus [HIV] disease: Secondary | ICD-10-CM

## 2019-02-23 DIAGNOSIS — N281 Cyst of kidney, acquired: Secondary | ICD-10-CM | POA: Diagnosis not present

## 2019-02-23 DIAGNOSIS — I5032 Chronic diastolic (congestive) heart failure: Secondary | ICD-10-CM | POA: Diagnosis not present

## 2019-02-23 NOTE — Progress Notes (Signed)
CC: hospital follow-up   HPI:  [name] is a 63 y.o. with PMH as below presenting for hospital follow-up for acute heart failure exacerbation.  She states since discharge she has been weighing herself daily and has gained weight since discharge. Our scale shows increase of 10lbs since recent discharge. She was prescribed lasix 40 mg daily but has been taking this every other day as she is very thirsty and feels dehydrated. She also was started on spironolactone 25mg  qd and has been taking this daily. She has slightly increased shortness of breath. She use three pillows to sleep and this has not increased since discharge. Her LE swelling has increased since discharge although prior to hospitalization it was much more significant. Her LLE is chronically more swollen than the right and she is going to see vascular in two days for this.  She has been avoiding sodium but not measuring fluid intake. She was switched from po clonidine to clonidine patch .2mg . She wore this the first two days then took it off for two days as she thought it was making her nauseous. She has been wearing the patch since then without issue.   Please see A&P for assessment of the patient's acute and chronic medical conditions.   Past Medical History:  Diagnosis Date  . Anemia   . Anxiety    HX PANIC ATTACKS  . Arthritis    "starting to; in my hands" (07/09/2015)  . Asthma   . Atrial fibrillation ([place])   . Atrial flutter, paroxysmal ([place])   . Bloated abdomen   . CFS (chronic fatigue syndrome)   . Chewing difficulty   . Chronic asthma with acute exacerbation    "I have chronic asthma all the time; sometimes exacerbations" (07/09/2015)  . Chronic lower back pain   . COPD (chronic obstructive pulmonary disease) ([place])   . Cyst of right kidney    "3 of them; dx'd in ~ 01/2015"  . Dyspnea   . GERD (gastroesophageal reflux disease)   . Heart murmur   . History of blood transfusion    "related to my brain  surgery I think"  . History of pulmonary embolism 07/09/2015  . HIV antibody positive ([place])   . HIV disease ([place])   . Hyperlipidemia   . Hypertension   . Leg edema   . Lipodystrophy   . Mild CAD 2013  . Palpitations   . Pneumonia 07/09/2015  . Shingles   . Sleep apnea    "never completed part 2 of study; never wore mask" (07/09/2015)  . Vitamin B 12 deficiency   . Vitamin D deficiency    Review of Systems:   Review of Systems  Constitutional: Negative for fever and weight loss.  HENT: Negative for congestion and sinus pain.   Eyes: Negative for discharge and redness.  Respiratory: Positive for shortness of breath. Negative for cough and wheezing.   Cardiovascular: Positive for orthopnea and leg swelling. Negative for chest pain and palpitations.  Gastrointestinal: Negative for abdominal pain, constipation, diarrhea and nausea.  Genitourinary: Negative for dysuria, frequency and urgency.  Musculoskeletal: Negative for back pain and myalgias.  Neurological: Negative for dizziness, sensory change and headaches.   Physical Exam:  Constitution: NAD, obese HEENT: [place]/at Cardio: RRR, no m/r/g, +2LE edema left, +1 right, no JVP Respiratory: CTA, no wheezing, rales or rhonchi  Abdominal: NTTP, non-distended  MSK: moving all extremities, strength symmetric  Neuro: a&o, normal affect   Vitals:   02/23/19 1019  BP: (!) 149/95  Pulse: 83  Temp: 98.2 F (36.8 C)  SpO2: 98%  Weight: 230 lb 6.4 oz (104.5 kg)  Height: 5\' 7"  (1.702 m)    Assessment & Plan:   See Encounters Tab for problem based charting.  Patient discussed with Dr. Rebeca Alert

## 2019-02-23 NOTE — Patient Instructions (Addendum)
Thank you for allowing [place] to provide your care today. Today we discussed your heart failure, HIV, and kidney disease  I have ordered the following labs for you:  Basic metabolic panel and complete blood count    I will call if any are abnormal.    Today we made the following changes to your medications:   Please continue taking your lasix 40 mg tablet once per day. Please also try to measure your fluid intake daily.   I have also ordered an MRI for your kidneys. They will call you to schedule an appointment.   Please follow-up in two weeks. If you have increased shortness of breath please call the clinic before then.    Should you have any questions or concerns please call the internal medicine clinic at [phone].

## 2019-02-24 LAB — CBC
Hematocrit: 36.3 % (ref 34.0–46.6)
Hemoglobin: 12.3 g/dL (ref 11.1–15.9)
MCH: 30.2 pg (ref 26.6–33.0)
MCHC: 33.9 g/dL (ref 31.5–35.7)
MCV: 89 fL (ref 79–97)
Platelets: 181 10*3/uL (ref 150–450)
RBC: 4.07 x10E6/uL (ref 3.77–5.28)
RDW: 13.3 % (ref 11.7–15.4)
WBC: 5.3 10*3/uL (ref 3.4–10.8)

## 2019-02-24 LAB — BMP8+ANION GAP
Anion Gap: 18 mmol/L (ref 10.0–18.0)
BUN/Creatinine Ratio: 13 (ref 12–28)
BUN: 17 mg/dL (ref 8–27)
CO2: 25 mmol/L (ref 20–29)
Calcium: 9.9 mg/dL (ref 8.7–10.3)
Chloride: 102 mmol/L (ref 96–106)
Creatinine, Ser: 1.27 mg/dL — ABNORMAL HIGH (ref 0.57–1.00)
GFR calc Af Amer: 52 mL/min/{1.73_m2} — ABNORMAL LOW (ref >59)
GFR calc non Af Amer: 45 mL/min/{1.73_m2} — ABNORMAL LOW (ref >59)
Glucose: 98 mg/dL (ref 65–99)
Potassium: 4 mmol/L (ref 3.5–5.2)
Sodium: 145 mmol/L — ABNORMAL HIGH (ref 134–144)

## 2019-02-26 NOTE — Assessment & Plan Note (Signed)
She was switched from po clonidine to clonidine patch .2mg . She cannot take lisinopril due to AE of cough. She wore the patch the first two days then took it off for two days as she thought it was making her nauseous. She has been wearing the patch since then without issue. Had a long discussion of why she could not just take the patch off and the possibility of rebound hypertension. She endorsed understanding.  BP Readings from Last 3 Encounters:  02/23/19 (!) 149/95  02/08/19 (!) 156/108  02/06/19 (!) 183/98  Blood pressure is currently improved from admission.   - f/u two weeks for bp check  - continue clonidine .2mg  patch - confirm she is wearing this consistently at follow-up

## 2019-02-26 NOTE — Assessment & Plan Note (Signed)
She states since discharge she has been weighing herself daily and has gained weight since discharge. Our scale shows increase of 10lbs since recent discharge. She was prescribed lasix 40 mg daily but has been taking this every other day as she is very thirsty and feels dehydrated. She also was started on spironolactone 25mg  qd and has been taking this daily. She has slightly increased shortness of breath. She use three pillows to sleep and this has not increased since discharge. Her LE swelling has increased since discharge although prior to hospitalization it was much more significant. Her LLE is chronically more swollen than the right and she is going to see vascular in two days for this. At previous appointment she also had ABIs concerning for PAD bilaterally. She has been avoiding sodium but not measuring fluid intake. On physical exam she does have +2 pitting edema on the left and +1 on the right. Lungs are clear and no JVD on exam. She is 98% on RA.   - BMP today - will f/u with lab results - cont. Mg and K every other day  - cont. Spironolactone 25 mg qd - instructed her to take her lasix 40 mg daily as she will need this to continue to keep fluid off, provided education on heart failure and kidney function relationship.  - suggested she measure her fluid intake as she may not be drinking enough water but does not want to take too much - follow-up two weeks

## 2019-02-26 NOTE — Assessment & Plan Note (Signed)
MRI was previously ordered per PCP but she was unable to get this done due to the coronavirus and is asking if it can be reordered  - MRI abdomen - sent message to staff to see if they can help her reschedule as it is still an active order

## 2019-02-27 DIAGNOSIS — J449 Chronic obstructive pulmonary disease, unspecified: Secondary | ICD-10-CM | POA: Diagnosis not present

## 2019-02-27 DIAGNOSIS — I080 Rheumatic disorders of both mitral and aortic valves: Secondary | ICD-10-CM | POA: Diagnosis not present

## 2019-02-27 DIAGNOSIS — E785 Hyperlipidemia, unspecified: Secondary | ICD-10-CM | POA: Diagnosis not present

## 2019-02-27 DIAGNOSIS — I313 Pericardial effusion (noninflammatory): Secondary | ICD-10-CM | POA: Diagnosis not present

## 2019-02-27 DIAGNOSIS — G4710 Hypersomnia, unspecified: Secondary | ICD-10-CM | POA: Diagnosis not present

## 2019-02-27 DIAGNOSIS — G4733 Obstructive sleep apnea (adult) (pediatric): Secondary | ICD-10-CM | POA: Diagnosis not present

## 2019-02-27 DIAGNOSIS — J309 Allergic rhinitis, unspecified: Secondary | ICD-10-CM | POA: Diagnosis not present

## 2019-02-27 DIAGNOSIS — I4892 Unspecified atrial flutter: Secondary | ICD-10-CM | POA: Diagnosis not present

## 2019-02-27 DIAGNOSIS — K219 Gastro-esophageal reflux disease without esophagitis: Secondary | ICD-10-CM | POA: Diagnosis not present

## 2019-02-27 DIAGNOSIS — N281 Cyst of kidney, acquired: Secondary | ICD-10-CM | POA: Diagnosis not present

## 2019-02-27 DIAGNOSIS — I2510 Atherosclerotic heart disease of native coronary artery without angina pectoris: Secondary | ICD-10-CM | POA: Diagnosis not present

## 2019-02-27 DIAGNOSIS — E559 Vitamin D deficiency, unspecified: Secondary | ICD-10-CM | POA: Diagnosis not present

## 2019-02-27 DIAGNOSIS — I77819 Aortic ectasia, unspecified site: Secondary | ICD-10-CM | POA: Diagnosis not present

## 2019-02-27 DIAGNOSIS — E639 Nutritional deficiency, unspecified: Secondary | ICD-10-CM | POA: Diagnosis not present

## 2019-02-27 DIAGNOSIS — M5412 Radiculopathy, cervical region: Secondary | ICD-10-CM | POA: Diagnosis not present

## 2019-02-27 DIAGNOSIS — J4541 Moderate persistent asthma with (acute) exacerbation: Secondary | ICD-10-CM | POA: Diagnosis not present

## 2019-02-27 DIAGNOSIS — I130 Hypertensive heart and chronic kidney disease with heart failure and stage 1 through stage 4 chronic kidney disease, or unspecified chronic kidney disease: Secondary | ICD-10-CM | POA: Diagnosis not present

## 2019-02-27 DIAGNOSIS — E538 Deficiency of other specified B group vitamins: Secondary | ICD-10-CM | POA: Diagnosis not present

## 2019-02-27 DIAGNOSIS — I5033 Acute on chronic diastolic (congestive) heart failure: Secondary | ICD-10-CM | POA: Diagnosis not present

## 2019-02-27 DIAGNOSIS — K5900 Constipation, unspecified: Secondary | ICD-10-CM | POA: Diagnosis not present

## 2019-02-27 DIAGNOSIS — N183 Chronic kidney disease, stage 3 (moderate): Secondary | ICD-10-CM | POA: Diagnosis not present

## 2019-02-27 DIAGNOSIS — M25511 Pain in right shoulder: Secondary | ICD-10-CM | POA: Diagnosis not present

## 2019-03-01 NOTE — Progress Notes (Signed)
Internal Medicine Clinic Attending  Case discussed with Dr. Seawell at the time of the visit.  We reviewed the resident's history and exam and pertinent patient test results.  I agree with the assessment, diagnosis, and plan of care documented in the resident's note.  Alexander Raines, M.D., Ph.D.  

## 2019-03-05 ENCOUNTER — Other Ambulatory Visit: Payer: Self-pay | Admitting: Internal Medicine

## 2019-03-06 NOTE — Telephone Encounter (Signed)
Next appt scheduled 8/21 with PCP.

## 2019-03-07 ENCOUNTER — Other Ambulatory Visit: Payer: Self-pay | Admitting: Internal Medicine

## 2019-03-08 ENCOUNTER — Ambulatory Visit: Payer: Medicare Other

## 2019-03-08 ENCOUNTER — Encounter: Payer: Self-pay | Admitting: Internal Medicine

## 2019-03-08 DIAGNOSIS — N281 Cyst of kidney, acquired: Secondary | ICD-10-CM | POA: Diagnosis not present

## 2019-03-08 DIAGNOSIS — E538 Deficiency of other specified B group vitamins: Secondary | ICD-10-CM | POA: Diagnosis not present

## 2019-03-08 DIAGNOSIS — I4892 Unspecified atrial flutter: Secondary | ICD-10-CM | POA: Diagnosis not present

## 2019-03-08 DIAGNOSIS — J309 Allergic rhinitis, unspecified: Secondary | ICD-10-CM | POA: Diagnosis not present

## 2019-03-08 DIAGNOSIS — I77819 Aortic ectasia, unspecified site: Secondary | ICD-10-CM | POA: Diagnosis not present

## 2019-03-08 DIAGNOSIS — I5033 Acute on chronic diastolic (congestive) heart failure: Secondary | ICD-10-CM | POA: Diagnosis not present

## 2019-03-08 DIAGNOSIS — E785 Hyperlipidemia, unspecified: Secondary | ICD-10-CM | POA: Diagnosis not present

## 2019-03-08 DIAGNOSIS — N183 Chronic kidney disease, stage 3 (moderate): Secondary | ICD-10-CM | POA: Diagnosis not present

## 2019-03-08 DIAGNOSIS — I080 Rheumatic disorders of both mitral and aortic valves: Secondary | ICD-10-CM | POA: Diagnosis not present

## 2019-03-08 DIAGNOSIS — I313 Pericardial effusion (noninflammatory): Secondary | ICD-10-CM | POA: Diagnosis not present

## 2019-03-08 DIAGNOSIS — I2510 Atherosclerotic heart disease of native coronary artery without angina pectoris: Secondary | ICD-10-CM | POA: Diagnosis not present

## 2019-03-08 DIAGNOSIS — K5900 Constipation, unspecified: Secondary | ICD-10-CM | POA: Diagnosis not present

## 2019-03-08 DIAGNOSIS — K219 Gastro-esophageal reflux disease without esophagitis: Secondary | ICD-10-CM | POA: Diagnosis not present

## 2019-03-08 DIAGNOSIS — E559 Vitamin D deficiency, unspecified: Secondary | ICD-10-CM | POA: Diagnosis not present

## 2019-03-08 DIAGNOSIS — J4541 Moderate persistent asthma with (acute) exacerbation: Secondary | ICD-10-CM | POA: Diagnosis not present

## 2019-03-08 DIAGNOSIS — J449 Chronic obstructive pulmonary disease, unspecified: Secondary | ICD-10-CM | POA: Diagnosis not present

## 2019-03-08 DIAGNOSIS — G4733 Obstructive sleep apnea (adult) (pediatric): Secondary | ICD-10-CM | POA: Diagnosis not present

## 2019-03-08 DIAGNOSIS — G4710 Hypersomnia, unspecified: Secondary | ICD-10-CM | POA: Diagnosis not present

## 2019-03-08 DIAGNOSIS — M25511 Pain in right shoulder: Secondary | ICD-10-CM | POA: Diagnosis not present

## 2019-03-08 DIAGNOSIS — I130 Hypertensive heart and chronic kidney disease with heart failure and stage 1 through stage 4 chronic kidney disease, or unspecified chronic kidney disease: Secondary | ICD-10-CM | POA: Diagnosis not present

## 2019-03-08 DIAGNOSIS — M5412 Radiculopathy, cervical region: Secondary | ICD-10-CM | POA: Diagnosis not present

## 2019-03-08 DIAGNOSIS — E639 Nutritional deficiency, unspecified: Secondary | ICD-10-CM | POA: Diagnosis not present

## 2019-03-09 ENCOUNTER — Encounter: Payer: Medicare Other | Admitting: Vascular Surgery

## 2019-03-09 ENCOUNTER — Ambulatory Visit (HOSPITAL_COMMUNITY)
Admission: RE | Admit: 2019-03-09 | Discharge: 2019-03-09 | Disposition: A | Discharge status: Home / Self Care | Payer: Medicare Other | Source: Ambulatory Visit | Attending: Cardiology | Admitting: Cardiology

## 2019-03-09 ENCOUNTER — Other Ambulatory Visit: Payer: Self-pay

## 2019-03-09 DIAGNOSIS — I10 Essential (primary) hypertension: Secondary | ICD-10-CM | POA: Diagnosis not present

## 2019-03-09 DIAGNOSIS — I739 Peripheral vascular disease, unspecified: Secondary | ICD-10-CM | POA: Diagnosis not present

## 2019-03-13 ENCOUNTER — Ambulatory Visit: Payer: Medicare Other | Admitting: Internal Medicine

## 2019-03-13 ENCOUNTER — Telehealth (INDEPENDENT_AMBULATORY_CARE_PROVIDER_SITE_OTHER): Payer: Medicare Other | Admitting: Physician Assistant

## 2019-03-13 DIAGNOSIS — J45909 Unspecified asthma, uncomplicated: Secondary | ICD-10-CM | POA: Diagnosis not present

## 2019-03-13 DIAGNOSIS — G4733 Obstructive sleep apnea (adult) (pediatric): Secondary | ICD-10-CM | POA: Diagnosis not present

## 2019-03-13 DIAGNOSIS — J449 Chronic obstructive pulmonary disease, unspecified: Secondary | ICD-10-CM | POA: Diagnosis not present

## 2019-03-13 DIAGNOSIS — R269 Unspecified abnormalities of gait and mobility: Secondary | ICD-10-CM | POA: Diagnosis not present

## 2019-03-14 ENCOUNTER — Ambulatory Visit (INDEPENDENT_AMBULATORY_CARE_PROVIDER_SITE_OTHER): Payer: Medicare Other | Admitting: Internal Medicine

## 2019-03-14 ENCOUNTER — Other Ambulatory Visit: Payer: Self-pay

## 2019-03-14 ENCOUNTER — Encounter: Payer: Self-pay | Admitting: Internal Medicine

## 2019-03-14 VITALS — BP 122/83 | HR 105 | Temp 98.9°F

## 2019-03-14 DIAGNOSIS — E881 Lipodystrophy, not elsewhere classified: Secondary | ICD-10-CM

## 2019-03-14 DIAGNOSIS — B20 Human immunodeficiency virus [HIV] disease: Secondary | ICD-10-CM | POA: Diagnosis not present

## 2019-03-14 DIAGNOSIS — N183 Chronic kidney disease, stage 3 unspecified: Secondary | ICD-10-CM

## 2019-03-14 NOTE — Progress Notes (Unsigned)
Office: [phone]  /  Fax: [phone]    Date: March 15, 2019   Appointment Start Time:*** Duration:*** Provider: Glennie Isle, Psy.D. Type of Session: Individual Therapy  Location of Patient: *** Location of Provider: Provider's Home Type of Contact: Telepsychological Visit via [employer] WebEx   Session Content: Shannon [name] is a 63 y.o. female presenting via [place] for a follow-up appointment to address the previously established treatment goal of decreasing emotional eating. Today's appointment was a telepsychological visit, as this provider's clinic is seeing a limited number of patients for in-person visits due to COVID-19. Therapeutic services will resume to in-person appointments once deemed appropriate. Shannon [name] expressed understanding regarding the rationale for telepsychological services, and provided verbal consent for today's appointment. Prior to proceeding with today's appointment, Shannon [name]'s physical location at the time of this appointment was obtained. Shannon [name] reported she was at *** and provided the address. In the event of technical difficulties, Janasia shared a phone number she could be reached at. Shannon [name] and this provider participated in today's telepsychological service. Also, Shannon [name] denied anyone else being present in the room or on the WebEx appointment ***.  This provider conducted a brief check-in and verbally administered the PHQ-9 and GAD-7. *** This provider discussed the importance of consistency of therapy appointments, and the benefits of longer-term therapeutic services was discussed again.   Shannon [name] was receptive to today's session as evidenced by openness to sharing, responsiveness to feedback, and ***.  Mental Status Examination:  Appearance: {Appearance:22431} Behavior: {Behavior:22445} Mood: {Teletherapy mood:22435} Affect: {Affect:22436} Speech: {Speech:22432} Eye Contact: {Eye Contact:22433} Psychomotor Activity: {Motor Activity:22434} Thought  Process: {thought process:22448}  Content/Perceptual Disturbances: {disturbances:22451} Orientation: {Orientation:22437} Cognition/Sensorium: {gbcognition:22449} Insight: {Insight:22446} Judgment: {Insight:22446}  Structured Assessment Results: The Patient Health Questionnaire-9 (PHQ-9) is a self-report measure that assesses symptoms and severity of depression over the course of the last two weeks. Shannon [name] obtained a score of *** suggesting {GBPHQ9SEVERITY:21752}. Shannon [name] finds the endorsed symptoms to be {gbphq9difficulty:21754}. Shannon [name] ***  Feeling down, depressed, or hopeless ***  Trouble falling or staying asleep, or sleeping too much ***  Feeling tired or having Shannon energy ***  Poor appetite or overeating ***  Feeling bad about yourself --- or that you are a failure or have let yourself or your family down ***  Trouble concentrating on [name], such as reading the newspaper or watching television ***  Moving or speaking so slowly that other people could have noticed? Or the opposite --- being so fidgety or restless that you have been moving around a lot more than usual ***  Thoughts that you would be better off dead or hurting yourself in some way ***  PHQ-9 Score ***    The Generalized Anxiety Disorder-7 (GAD-7) is a brief self-report measure that assesses symptoms of anxiety over the course of the last two weeks. Shannon [name] obtained a score of *** suggesting {gbgad7severity:21753}. Shannon [name] finds the endorsed symptoms to be {gbphq9difficulty:21754}. Feeling nervous, anxious, on edge ***  Not being able to stop or control worrying ***  Worrying too much about different [name] ***  Trouble relaxing ***  Being so restless that it's hard to sit still ***  Becoming easily annoyed or irritable ***  Feeling afraid as if something awful might happen ***  GAD-7 Score ***   Interventions:  {Interventions:22172}  DSM-5 Diagnosis: 311 (F32.8) Other  Specified Depressive Disorder, Emotional Eating Behaviors  Treatment Goal & Progress: During the initial appointment with this provider, the following treatment goal was established: decrease emotional  eating. Shannon [name] has demonstrated progress in her goal as evidenced by ***  Plan: Shannon [name] continues to appear able and willing to participate as evidenced by engagement in reciprocal conversation, and asking questions for clarification as appropriate. The next appointment will be scheduled in {gbweeks:21758}, which will be via [employer]. Once this provider's office resumes in-person appointments and it is deemed appropriate, Shannon [name] will be notified. The next session will focus on reviewing learned skills, and working towards the established treatment goal.***

## 2019-03-14 NOTE — Progress Notes (Signed)
Patient ID: Shannon [name], female   DOB: [birth year], 63 y.o.   MRN: [id number]  HPI 63yo F with HIV disease, and now newly diagnosed with CHF and Had admission for heart failure. Also evaluated for peripheral vascular disease. Overall, feeling better since being hospitalized due to diuresis. Worried about evaluation for PVD and if needs any surgical intervention  Outpatient Encounter Medications as of 03/14/2019  Medication Sig  . acetaminophen (TYLENOL) 325 MG tablet Take 325 mg by mouth every 6 (six) hours as needed (FOR PAIN).  [name] albuterol (PROVENTIL) (2.5 MG/3ML) 0.083% nebulizer solution USE 1 VIAL VIA NEBULIZER EVERY 6 HOURS AS NEEDED FOR WHEEZING OR SHORTNESS OF BREATH  . Ascorbic Acid (VITAMIN C) 1000 MG tablet Take 1,000 mg by mouth 2 (two) times a week.   [name] azelastine (OPTIVAR) 0.05 % ophthalmic solution Place 2 drops into both eyes 2 (two) times daily as needed (for swelling or itching).   [name] BIKTARVY 50-200-25 MG TABS tablet TAKE 1 TABLET BY MOUTH DAILY (Patient taking differently: 1 tablet daily. )  . BIOTIN PO Take 1 tablet by mouth daily as needed (for supplementation).   . budesonide-formoterol (SYMBICORT) 80-4.5 MCG/ACT inhaler Inhale 2 puffs into the lungs 2 (two) times daily as needed (shortness of breath).   . Cholecalciferol (SM VITAMIN D3) 100 MCG (4000 UT) CAPS Take 1 capsule (4,000 Units total) by mouth daily.  . cloNIDine (CATAPRES - DOSED IN MG/24 HR) 0.2 mg/24hr patch UNWRAP AND APPLY 1 PATCH TO SKIN ONCE A WEEK  . cyanocobalamin (,VITAMIN B-12,) 1000 MCG/ML injection ADMINISTER 1 ML(1000 MCG) IN THE MUSCLE EVERY 30 DAYS  . diclofenac sodium (VOLTAREN) 1 % GEL Apply 2 g topically 3 (three) times daily as needed (arm pain).  [name] diltiazem (CARDIZEM CD) 240 MG 24 hr capsule Take 1 capsule (240 mg total) by mouth daily.  . fluticasone (FLONASE) 50 MCG/ACT nasal spray Place 2 sprays into both nostrils daily. (Patient taking differently: Place 2 sprays into both nostrils  daily as needed for allergies. )  . fluticasone furoate-vilanterol (BREO ELLIPTA) 200-25 MCG/INH AEPB Inhale 1 puff into the lungs daily.  . furosemide (LASIX) 40 MG tablet Take 1 tablet (40 mg total) by mouth daily.  [name] MAGNESIUM PO Take by mouth every other day.  . Melatonin 5 MG TABS Take 5 mg by mouth at bedtime as needed (for sleep).  . Misc. Devices (PULSE OXIMETER FOR FINGER) MISC 1 Units by Does not apply route as needed.  . montelukast (SINGULAIR) 10 MG tablet Take 1 tablet (10 mg total) by mouth at bedtime. (Patient taking differently: Take 10 mg by mouth daily as needed (allergies). )  . Mouthwashes (BIOTENE DRY MOUTH GENTLE) LIQD Use as directed 1 Dose in the mouth or throat 2 (two) times daily.  [name] omeprazole (PRILOSEC) 40 MG capsule Take 1 capsule (40 mg total) by mouth daily. (Patient taking differently: Take 40 mg by mouth daily as needed Jerrye Bushy). )  . OXYGEN Inhale 1.5-2 L into the lungs continuous.   . polyethylene glycol (MIRALAX / GLYCOLAX) packet Take 17 g by mouth daily. (Patient taking differently: Take 17 g by mouth daily as needed for mild constipation. )  . PREVIDENT 5000 SENSITIVE 1.1-5 % PSTE Use daily as directed  . PROAIR HFA 108 (90 Base) MCG/ACT inhaler INHALE 2 PUFFS INTO THE LUNGS EVERY 4 HOURS AS NEEDED FOR WHEEZING OR SHORTNESS OF BREATH  . rivaroxaban (XARELTO) 20 MG TABS tablet Take 1 tablet (20 mg  total) by mouth daily with supper.  . senna-docusate (SENOKOT-S) 8.6-50 MG tablet Take 1 tablet by mouth at bedtime as needed for mild constipation.  . sodium chloride (OCEAN) 0.65 % SOLN nasal spray Place 1 spray into both nostrils as needed for congestion.  [name] spironolactone (ALDACTONE) 25 MG tablet Take 1 tablet (25 mg total) by mouth daily.  . Tetrahydrozoline HCl (VISINE OP) Place 2 drops into both eyes daily as needed (for dry eyes).   . potassium chloride SA (K-DUR,KLOR-CON) 20 MEQ tablet Take 20 mEq by mouth every other day.   No facility-administered encounter  medications on file as of 03/14/2019.      Patient Active Problem List   Diagnosis Date Noted  . (HFpEF) heart failure with preserved ejection fraction ([place]) 02/07/2019  . Leg swelling 11/03/2018  . Other fatigue 06/30/2018  . Shortness of breath on exertion 06/30/2018  . Hyperglycemia 06/30/2018  . Vitamin D deficiency 06/30/2018  . B12 nutritional deficiency 06/30/2018  . Chronic anticoagulation 01/03/2018  . Constipation 11/03/2017  . Atrial flutter ([place]) 03/11/2017  . CAD (coronary artery disease) 01/28/2017  . Obesity (BMI 30-39.9) 01/13/2017  . Ascending aorta dilatation ([facility]) 01/07/2017  . Low back pain radiating to right lower extremity 12/02/2016  . Acquired cyst of kidney 12/02/2016  . Chronic kidney disease (CKD), stage III (moderate) ([place]) 12/01/2016  . Generalized anxiety disorder 10/22/2016  . Hypersomnia 10/01/2016  . Accessory skin tags 06/10/2016  . Nocturnal hypoxemia 05/13/2016  . Cervical radiculopathy 04/14/2016  . GERD (gastroesophageal reflux disease) 01/02/2016  . Vitamin B12 deficiency 10/02/2015  . Dyspnea 07/09/2015  . Moderate persistent asthma with exacerbation 07/09/2015  . HIV disease ([place]) 07/09/2015  . Uncontrolled hypertension 07/09/2015     Health Maintenance Due  Topic Date Due  . TETANUS/TDAP  03/03/1973  . COLONOSCOPY  03/03/2004  . MAMMOGRAM  03/26/2018  . DEXA SCAN  03/04/2019  . PNA vac Low Risk Adult (1 of 2 - PCV13) 03/04/2019     Review of Systems Review of Systems  Constitutional: Negative for fever, chills, diaphoresis, activity change, appetite change, fatigue and unexpected weight change.  HENT: Negative for congestion, sore throat, rhinorrhea, sneezing, trouble swallowing and sinus pressure.  Eyes: Negative for photophobia and visual disturbance.  Respiratory: Negative for cough, chest tightness, shortness of breath, wheezing and stridor.  Cardiovascular: Negative for chest pain, palpitations and leg swelling.   Gastrointestinal: Negative for nausea, vomiting, abdominal pain, diarrhea, constipation, blood in stool, abdominal distention and anal bleeding.  Genitourinary: Negative for dysuria, hematuria, flank pain and difficulty urinating.  Musculoskeletal: Negative for myalgias, back pain, joint swelling, arthralgias and gait problem.  Skin: Negative for color change, pallor, rash and wound.  Neurological: Negative for dizziness, tremors, weakness and light-headedness.  Hematological: Negative for adenopathy. Does not bruise/bleed easily.  Psychiatric/Behavioral: Negative for behavioral problems, confusion, sleep disturbance, dysphoric mood, decreased concentration and agitation.    Physical Exam   BP 122/83   Pulse (!) 105   Temp 98.9 F (37.2 C) (Oral)   Physical Exam  Constitutional:  oriented to person, place, and time. appears well-developed and well-nourished. No distress.  HENT: [place]/AT, PERRLA, no scleral icterus Mouth/Throat: Oropharynx is clear and moist. No oropharyngeal exudate.  Cardiovascular: Normal rate, regular rhythm and normal heart sounds. Exam reveals no gallop and no friction rub.  No murmur heard.  Pulmonary/Chest: Effort normal and breath sounds normal. No respiratory distress.  has no wheezes.  Neck = supple, no nuchal rigidity Abdominal: Soft. Bowel  sounds are normal.  exhibits no distension. There is no tenderness.  Lymphadenopathy: no cervical adenopathy. No axillary adenopathy Neurological: alert and oriented to person, place, and time.  Skin: Skin is warm and dry. No rash noted. No erythema.  Psychiatric: a normal mood and affect.  behavior is normal.   Lab Results  Component Value Date   CD4TCELL 39 02/08/2019   Lab Results  Component Value Date   CD4TABS 595 02/08/2019   CD4TABS 640 11/03/2018   CD4TABS 670 12/13/2017   Lab Results  Component Value Date   HIV1RNAQUANT <20 NOT DETECTED 11/03/2018   No results found for: HEPBSAB Lab Results  Component  Value Date   LABRPR NON-REACTIVE 12/13/2017    CBC Lab Results  Component Value Date   WBC 5.3 02/23/2019   RBC 4.07 02/23/2019   HGB 12.3 02/23/2019   HCT 36.3 02/23/2019   PLT 181 02/23/2019   MCV 89 02/23/2019   MCH 30.2 02/23/2019   MCHC 33.9 02/23/2019   RDW 13.3 02/23/2019   LYMPHSABS 1.4 02/03/2019   MONOABS 0.4 02/03/2019   EOSABS 0.1 02/03/2019    BMET Lab Results  Component Value Date   NA 145 (H) 02/23/2019   K 4.0 02/23/2019   CL 102 02/23/2019   CO2 25 02/23/2019   GLUCOSE 98 02/23/2019   BUN 17 02/23/2019   CREATININE 1.27 (H) 02/23/2019   CALCIUM 9.9 02/23/2019   GFRNONAA 45 (L) 02/23/2019   GFRAA 52 (L) 02/23/2019      Assessment and Plan  hiv disease= well controlled. Continue on current regimen  ckd 3 = continue with biktarvy does not need any dose adjust at this time  Lipodystrophy due to early hiv regimen = mentioned that there is not necessarily any medication that can reverse this course  Central obesity = may benefit from nutritional counseling in the future after evaluation for pvd  Will see back in 3 months

## 2019-03-15 ENCOUNTER — Ambulatory Visit (HOSPITAL_COMMUNITY): Payer: Medicare Other

## 2019-03-15 ENCOUNTER — Ambulatory Visit (INDEPENDENT_AMBULATORY_CARE_PROVIDER_SITE_OTHER): Payer: Medicare Other | Admitting: Psychology

## 2019-03-15 LAB — T-HELPER CELL (CD4) - (RCID CLINIC ONLY)
CD4 % Helper T Cell: 40 % (ref 33–65)
CD4 T Cell Abs: 463 /uL (ref 400–1790)

## 2019-03-17 ENCOUNTER — Telehealth: Payer: Self-pay | Admitting: *Deleted

## 2019-03-17 LAB — HIV-1 RNA QUANT-NO REFLEX-BLD
HIV 1 RNA Quant: <20 copies/mL
HIV-1 RNA Quant, Log: <1.3 Log copies/mL

## 2019-03-17 NOTE — Telephone Encounter (Signed)
Pt refused PT HH today, it was last visit for disch, they will reschedule next week

## 2019-03-20 ENCOUNTER — Encounter: Payer: Self-pay | Admitting: Internal Medicine

## 2019-03-20 ENCOUNTER — Ambulatory Visit: Payer: Medicare Other

## 2019-03-21 ENCOUNTER — Telehealth: Payer: Self-pay | Admitting: *Deleted

## 2019-03-21 ENCOUNTER — Telehealth (INDEPENDENT_AMBULATORY_CARE_PROVIDER_SITE_OTHER): Payer: Self-pay | Admitting: Psychology

## 2019-03-21 ENCOUNTER — Ambulatory Visit (INDEPENDENT_AMBULATORY_CARE_PROVIDER_SITE_OTHER): Payer: Medicare Other | Admitting: Psychology

## 2019-03-21 NOTE — Progress Notes (Unsigned)
Office: [phone]  /  Fax: [phone]    Date: March 21, 2019    Appointment Start Time:*** Duration:*** Provider: Glennie Isle, Psy.D. Type of Session: Individual Therapy  Location of Patient: *** Location of Provider: Provider's Home Type of Contact: Telepsychological Visit via [employer] WebEx   Session Content: Shannon [name] is a 63 y.o. female presenting via [place] for a follow-up appointment to address the previously established treatment goal of decreasing emotional eating. Of note, this provider called Shannon [name] at 9:59am to provide assistance with connecting to today's appointment. A HIPAA compliant voicemail was left requesting a call back. Today's appointment was a telepsychological visit, as this provider's clinic is seeing a limited number of patients for in-person visits due to COVID-19. Therapeutic services will resume to in-person appointments once deemed appropriate. Shannon [name] expressed understanding regarding the rationale for telepsychological services, and provided verbal consent for today's appointment. Prior to proceeding with today's appointment, Shannon [name]'s physical location at the time of this appointment was obtained. Shannon [name] reported she was at *** and provided the address. In the event of technical difficulties, Shannon [name] shared a phone number she could be reached at. Shannon [name] and this provider participated in today's telepsychological service. Also, Shannon [name] denied anyone else being present in the room or on the WebEx appointment ***.  This provider conducted a brief check-in and verbally administered the PHQ-9 and GAD-7. *** This provider discussed the importance of consistency of therapy appointments and again discussed the options of a referral.   Shannon [name] was receptive to today's session as evidenced by openness to sharing, responsiveness to feedback, and ***.  Mental Status Examination:  Appearance: {Appearance:22431} Behavior: {Behavior:22445} Mood: {Teletherapy  mood:22435} Affect: {Affect:22436} Speech: {Speech:22432} Eye Contact: {Eye Contact:22433} Psychomotor Activity: {Motor Activity:22434} Thought Process: {thought process:22448}  Content/Perceptual Disturbances: {disturbances:22451} Orientation: {Orientation:22437} Cognition/Sensorium: {gbcognition:22449} Insight: {Insight:22446} Judgment: {Insight:22446}  Structured Assessment Results: The Patient Health Questionnaire-9 (PHQ-9) is a self-report measure that assesses symptoms and severity of depression over the course of the last two weeks. Shannon [name] obtained a score of *** suggesting {GBPHQ9SEVERITY:21752}. Shannon [name] finds the endorsed symptoms to be {gbphq9difficulty:21754}. [name] ***  Feeling down, depressed, or hopeless ***  Trouble falling or staying asleep, or sleeping too much ***  Feeling tired or having little energy ***  Poor appetite or overeating ***  Feeling bad about yourself --- or that you are a failure or have let yourself or your family down ***  Trouble concentrating on things, such as reading the newspaper or watching television ***  Moving or speaking so slowly that other people could have noticed? Or the opposite --- being so fidgety or restless that you have been moving around a lot more than usual ***  Thoughts that you would be better off dead or hurting yourself in some way ***  PHQ-9 Score ***    The Generalized Anxiety Disorder-7 (GAD-7) is a brief self-report measure that assesses symptoms of anxiety over the course of the last two weeks. Shannon [name] obtained a score of *** suggesting {gbgad7severity:21753}. Shannon [name] finds the endorsed symptoms to be {gbphq9difficulty:21754}. Feeling nervous, anxious, on edge ***  Not being able to stop or control worrying ***  Worrying too much about different things ***  Trouble relaxing ***  Being so restless that it's hard to sit still ***  Becoming easily annoyed or irritable ***  Feeling  afraid as if something awful might happen ***  GAD-7 Score ***   Interventions:  {Interventions:22172}  DSM-5 Diagnosis: 311 (F32.8) Other Specified  Depressive Disorder, Emotional Eating Behaviors  Treatment Goal & Progress: During the initial appointment with this provider, the following treatment goal was established: decrease emotional eating. Shannon [name] has demonstrated progress in her goal as evidenced by ***  Plan: Shannon [name] continues to appear able and willing to participate as evidenced by engagement in reciprocal conversation, and asking questions for clarification as appropriate. The next appointment will be scheduled in {gbweeks:21758}, which will be via [employer]. Once this provider's office resumes in-person appointments and it is deemed appropriate, Shannon [name] will be notified. The next session will focus on reviewing learned skills, and working towards the established treatment goal.***

## 2019-03-21 NOTE — Telephone Encounter (Signed)
Patient called triage, very worried and needing to talk to someone. She feels her HIV medicine is not working. She states she knows this, despite labs, because she is losing muscle mass, is tired, feels weak.  She states that she knows this is all related to her HIV, knows it "woke back up" knows she is "developing resistance." She is also following up with [facility], was recently hospitalized for congestive heart failure.  RN spent some time discussing lab results with patient, reassured her.  She will follow up in early September with Dr Baxter Flattery after handling her more pressing medical needs. Landis Gandy, RN

## 2019-03-21 NOTE — Telephone Encounter (Signed)
  Office: [phone]  /  Fax: [phone]  Date of Call: March 21, 2019  Time of Call: 9:59am Provider: Glennie Isle, PsyD  CONTENT: This provider called [name] to assist with connecting for today's 10:00am Webex appointment. A HIPAA compliant voicemail was left requesting a call back. Ruari did not present for today's appointment. Of note, this provider stayed on the [facility] appointment for 7 minutes prior to signing off.   PLAN: This provider will wait for [place] to call back. If deemed necessary, this provider or the provider's clinic will call R[facility] again in approximately one week.

## 2019-03-22 ENCOUNTER — Telehealth: Payer: Self-pay | Admitting: *Deleted

## 2019-03-22 NOTE — Telephone Encounter (Signed)
disch from PT [employer] has been postponed once again due to pt wanting to wait until after her visit on 8/6 with vascular

## 2019-03-30 ENCOUNTER — Encounter: Payer: Self-pay | Admitting: Vascular Surgery

## 2019-03-30 ENCOUNTER — Other Ambulatory Visit: Payer: Self-pay

## 2019-03-30 ENCOUNTER — Ambulatory Visit (INDEPENDENT_AMBULATORY_CARE_PROVIDER_SITE_OTHER): Payer: Medicare Other | Admitting: Vascular Surgery

## 2019-03-30 VITALS — BP 175/96 | HR 95 | Temp 97.7°F | Resp 20 | Ht 67.0 in | Wt 228.0 lb

## 2019-03-30 DIAGNOSIS — I739 Peripheral vascular disease, unspecified: Secondary | ICD-10-CM

## 2019-03-30 NOTE — Progress Notes (Signed)
Referring Physician: Dr Betsy Coder Patient name: Shannon [name] MRN: [id number] DOB: August 25, 1955 Sex: female  REASON FOR CONSULT: Intermittent leg swelling  HPI: Shannon [name] is a 63 y.o. female, with a history of intermittent leg swelling both legs.  This improved after being started on Lasix.  He states she does have a history of congestive failure.  She has not had problems healing of wounds on her feet in the past.  She does not describe rest pain.  She admits to not being very active and is fairly sedentary.  She was told by someone that she should not walk to protect her legs.  She did get a scarf on her foot a few weeks ago that has not completely healed.  She also has numbness and tingling in her feet and ankles.  This is been present for some time.  She is able to walk about a half a block before she experiences some pain in her legs.  This does improve with rest.  Other medical problems include HIV, arthritis, asthma, paroxysmal atrial fibrillation (on Xarelto) COPD, hyperlipidemia, hypertension, all of which have been stable.  She is on home oxygen.  Past Medical History:  Diagnosis Date  . Anemia   . Anxiety    HX PANIC ATTACKS  . Arthritis    "starting to; in my hands" (07/09/2015)  . Asthma   . Atrial fibrillation ([place])   . Atrial flutter, paroxysmal ([place])   . Bloated abdomen   . CFS (chronic fatigue syndrome)   . Chewing difficulty   . Chronic asthma with acute exacerbation    "I have chronic asthma all the time; sometimes exacerbations" (07/09/2015)  . Chronic lower back pain   . COPD (chronic obstructive pulmonary disease) ([place])   . Cyst of right kidney    "3 of them; dx'd in ~ 01/2015"  . Dyspnea   . GERD (gastroesophageal reflux disease)   . Heart murmur   . History of blood transfusion    "related to my brain surgery I think"  . History of pulmonary embolism 07/09/2015  . HIV antibody positive ([place])   . HIV disease ([place])   . Hyperlipidemia   .  Hypertension   . Leg edema   . Lipodystrophy   . Mild CAD 2013  . Palpitations   . Pneumonia 07/09/2015  . Shingles   . Sleep apnea    "never completed part 2 of study; never wore mask" (07/09/2015)  . Vitamin B 12 deficiency   . Vitamin D deficiency    Past Surgical History:  Procedure Laterality Date  . ABDOMINAL HYSTERECTOMY     "robotic laparosopic"  . BRAIN SURGERY  1974   "brain tumor; benign; on top of my brain; got a plate in there"  . CARDIAC CATHETERIZATION    . TONSILLECTOMY AND ADENOIDECTOMY      Family History  Problem Relation Age of Onset  . Asthma Mother   . Heart failure Mother        cardiomyopathy  . Thyroid disease Mother   . Heart disease Mother   . Obesity Mother   . Heart murmur Sister   . Heart murmur Brother   . Diabetes Sister   . Thyroid disease Sister   . Heart murmur Sister     SOCIAL HISTORY: She does not smoke.   Allergies  Allergen Reactions  . Tree Extract Swelling and Other (See Comments)    Swelling to eyes  . Augmentin [Amoxicillin-Pot  Clavulanate] Other (See Comments)    Headache, dizzy  . Lisinopril Cough    Face/throat swelling  . Ciprofloxacin Hives    Current Outpatient Medications  Medication Sig Dispense Refill  . acetaminophen (TYLENOL) 325 MG tablet Take 325 mg by mouth every 6 (six) hours as needed (FOR PAIN).    [name] albuterol (PROVENTIL) (2.5 MG/3ML) 0.083% nebulizer solution USE 1 VIAL VIA NEBULIZER EVERY 6 HOURS AS NEEDED FOR WHEEZING OR SHORTNESS OF BREATH 75 mL 6  . Ascorbic Acid (VITAMIN C) 1000 MG tablet Take 1,000 mg by mouth 2 (two) times a week.     [name] azelastine (OPTIVAR) 0.05 % ophthalmic solution Place 2 drops into both eyes 2 (two) times daily as needed (for swelling or itching).   3  . BIKTARVY 50-200-25 MG TABS tablet TAKE 1 TABLET BY MOUTH DAILY (Patient taking differently: 1 tablet daily. ) 30 tablet 5  . BIOTIN PO Take 1 tablet by mouth daily as needed (for supplementation).     .  budesonide-formoterol (SYMBICORT) 80-4.5 MCG/ACT inhaler Inhale 2 puffs into the lungs 2 (two) times daily as needed (shortness of breath).     . Cholecalciferol (SM VITAMIN D3) 100 MCG (4000 UT) CAPS Take 1 capsule (4,000 Units total) by mouth daily. 30 capsule 0  . cloNIDine (CATAPRES - DOSED IN MG/24 HR) 0.2 mg/24hr patch UNWRAP AND APPLY 1 PATCH TO SKIN ONCE A WEEK 4 patch 0  . cyanocobalamin (,VITAMIN B-12,) 1000 MCG/ML injection ADMINISTER 1 ML(1000 MCG) IN THE MUSCLE EVERY 30 DAYS 1 mL 0  . diclofenac sodium (VOLTAREN) 1 % GEL Apply 2 g topically 3 (three) times daily as needed (arm pain). 50 g 0  . diltiazem (CARDIZEM CD) 240 MG 24 hr capsule Take 1 capsule (240 mg total) by mouth daily. 90 capsule 3  . fluticasone (FLONASE) 50 MCG/ACT nasal spray Place 2 sprays into both nostrils daily. (Patient taking differently: Place 2 sprays into both nostrils daily as needed for allergies. ) 16 g 2  . fluticasone furoate-vilanterol (BREO ELLIPTA) 200-25 MCG/INH AEPB Inhale 1 puff into the lungs daily. 1 each 0  . furosemide (LASIX) 40 MG tablet Take 1 tablet (40 mg total) by mouth daily. 90 tablet 3  . MAGNESIUM PO Take by mouth every other day.    . Melatonin 5 MG TABS Take 5 mg by mouth at bedtime as needed (for sleep).    . Misc. Devices (PULSE OXIMETER FOR FINGER) MISC 1 Units by Does not apply route as needed. 1 each 0  . montelukast (SINGULAIR) 10 MG tablet Take 1 tablet (10 mg total) by mouth at bedtime. (Patient taking differently: Take 10 mg by mouth daily as needed (allergies). ) 30 tablet 2  . Mouthwashes (BIOTENE DRY MOUTH GENTLE) LIQD Use as directed 1 Dose in the mouth or throat 2 (two) times daily. 1 Bottle 0  . omeprazole (PRILOSEC) 40 MG capsule Take 1 capsule (40 mg total) by mouth daily. (Patient taking differently: Take 40 mg by mouth daily as needed Jerrye Bushy). ) 90 capsule 3  . OXYGEN Inhale 1.5-2 L into the lungs continuous.     . polyethylene glycol (MIRALAX / GLYCOLAX) packet Take  17 g by mouth daily. (Patient taking differently: Take 17 g by mouth daily as needed for mild constipation. ) 14 each 0  . potassium chloride SA (K-DUR,KLOR-CON) 20 MEQ tablet Take 20 mEq by mouth every other day.    [name] PREVIDENT 5000 SENSITIVE 1.1-5 % PSTE Use daily  as directed  1  . PROAIR HFA 108 (90 Base) MCG/ACT inhaler INHALE 2 PUFFS INTO THE LUNGS EVERY 4 HOURS AS NEEDED FOR WHEEZING OR SHORTNESS OF BREATH 8.5 g 6  . rivaroxaban (XARELTO) 20 MG TABS tablet Take 1 tablet (20 mg total) by mouth daily with supper. 30 tablet 1  . senna-docusate (SENOKOT-S) 8.6-50 MG tablet Take 1 tablet by mouth at bedtime as needed for mild constipation. 30 tablet 0  . sodium chloride (OCEAN) 0.65 % SOLN nasal spray Place 1 spray into both nostrils as needed for congestion. 88 mL 0  . spironolactone (ALDACTONE) 25 MG tablet Take 1 tablet (25 mg total) by mouth daily. 30 tablet 11  . Tetrahydrozoline HCl (VISINE OP) Place 2 drops into both eyes daily as needed (for dry eyes).      No current facility-administered medications for this visit.     ROS:   General:  No weight loss, Fever, chills  HEENT: No recent headaches, no nasal bleeding, no visual changes, no sore throat  Neurologic: She does have some occasional dizziness.  Cardiac: No recent episodes of chest pain/pressure, no shortness of breath at rest.  +shortness of breath with exertion.  Denies history of atrial fibrillation or irregular heartbeat  Vascular: No history of rest pain in feet.  No history of claudication.  No history of non-healing ulcer, No history of DVT   Pulmonary: no productive cough, no hemoptysis,  + asthma or wheezing  Musculoskeletal:  [ ]  Arthritis, [ ]  Low back pain,  [ ]  Joint pain  Hematologic:No history of hypercoagulable state.  No history of easy bleeding.  No history of anemia  Gastrointestinal: No hematochezia or melena,  No gastroesophageal reflux, no trouble swallowing  Urinary: [ ]  chronic Kidney disease, [  ] on HD - [ ]  MWF or [ ]  TTHS, [ ]  Burning with urination, [ ]  Frequent urination, [ ]  Difficulty urinating;   Skin: No rashes  Psychological: No history of anxiety,  No history of depression   Physical Examination  Vitals:   03/30/19 1245  BP: (!) 175/96  Pulse: 95  Resp: 20  Temp: 97.7 F (36.5 C)  SpO2: 92%  Weight: 228 lb (103.4 kg)  Height: 5\' 7"  (1.702 m)    Body mass index is 35.71 kg/m.  General:  Alert and oriented, no acute distress HEENT: Normal Neck: No JVD Pulmonary: Clear to auscultation bilaterally Cardiac: Regular Rate and Rhythm  Abdomen: Soft, non-tender, non-distended, obese Skin: No rash, 3 mm less than 1 mm depth ulcer on the dorsal aspect of her left foot appears to be healing Extremity Pulses:  2+ radial, brachial, femoral, absent dorsalis pedis, posterior tibial pulses bilaterally Musculoskeletal: No deformity or edema  Neurologic: Upper and lower extremity motor 5/5 and symmetric  DATA:  Patient had bilateral ABIs performed on March 21, 2019.  Right side was 0.8 left side 0.9 duplex ultrasound showed evidence primarily of distal tibial disease with peroneal runoff bilaterally  ASSESSMENT: Patient with peripheral arterial disease currently with mild claudication symptoms no rest pain no tissue loss has adequate perfusion to both lower extremities.  Based on the duplex exam any intervention would be a tibial intervention which I would only reserve for possible threatened limb threatening ischemia.  She currently does not have this.  I have encouraged her to walk to improve her overall perfusion and develop collateralization.  She will try to walk for 30 minutes daily.  She will have a follow-up appointment with  [place] in 1 year.  We will repeat her ABIs at that time.  If she develops nonhealing wounds she will return sooner.  #2 numbness tingling in feet most likely secondary to neuropathy probably secondary to HIV medications or other etiology discussed  protecting her feet and avoiding trauma to them   PLAN: Repeat ABIs follow-up in our [facility] clinic in 1 year.   Ruta Hinds, MD [facility] Office: [phone] Pager: [phone]

## 2019-03-31 ENCOUNTER — Ambulatory Visit (HOSPITAL_COMMUNITY)
Admission: RE | Admit: 2019-03-31 | Discharge: 2019-03-31 | Disposition: A | Discharge status: Home / Self Care | Payer: Medicare Other | Source: Ambulatory Visit | Attending: Internal Medicine | Admitting: Internal Medicine

## 2019-03-31 ENCOUNTER — Other Ambulatory Visit: Payer: Self-pay | Admitting: Internal Medicine

## 2019-03-31 DIAGNOSIS — N2889 Other specified disorders of kidney and ureter: Secondary | ICD-10-CM | POA: Insufficient documentation

## 2019-03-31 DIAGNOSIS — N281 Cyst of kidney, acquired: Secondary | ICD-10-CM | POA: Diagnosis not present

## 2019-03-31 DIAGNOSIS — K760 Fatty (change of) liver, not elsewhere classified: Secondary | ICD-10-CM | POA: Diagnosis not present

## 2019-03-31 MED ORDER — GADOBUTROL 1 MMOL/ML IV SOLN
10.0000 mL | Freq: Once | INTRAVENOUS | Status: AC | PRN
  Administered 2019-03-31: 17:00:00 10 mL via INTRAVENOUS

## 2019-04-03 ENCOUNTER — Encounter (INDEPENDENT_AMBULATORY_CARE_PROVIDER_SITE_OTHER): Payer: Self-pay | Admitting: Physician Assistant

## 2019-04-03 ENCOUNTER — Telehealth (INDEPENDENT_AMBULATORY_CARE_PROVIDER_SITE_OTHER): Payer: Medicare Other | Admitting: Physician Assistant

## 2019-04-03 ENCOUNTER — Other Ambulatory Visit: Payer: Self-pay

## 2019-04-03 DIAGNOSIS — E559 Vitamin D deficiency, unspecified: Secondary | ICD-10-CM

## 2019-04-03 DIAGNOSIS — Z6835 Body mass index (BMI) 35.0-35.9, adult: Secondary | ICD-10-CM | POA: Diagnosis not present

## 2019-04-04 NOTE — Progress Notes (Signed)
Office: [phone]  /  Fax: [phone] TeleHealth Visit:  TERRENCE PIZANA has verbally consented to this TeleHealth visit today. The patient is located at home, the provider is located at the [facility] office. The participants in this visit include the listed provider and patient. The visit was conducted today via Webex (25 minutes).  HPI:   Chief Complaint: OBESITY Shannon [name] is here to discuss her progress with her obesity treatment plan. She is on the Category 2 plan and is following her eating plan approximately 25% of the time. She states she is exercising 0 minutes 0 times per week. Jasman reports her most recent weight to be 225 lbs. She has a new diagnosis of PAD, for which she saw [facility]. She is motivated to continue with her plan. We were unable to weigh the patient today for this TeleHealth visit. She feels as if she has lost weight since her last visit. She has lost 3 lbs since starting treatment with [place].  Vitamin D deficiency Yasmina has a diagnosis of Vitamin D deficiency. She is currently taking OTC Vit D and denies nausea, vomiting or muscle weakness.  ASSESSMENT AND PLAN:  Vitamin D deficiency  Class 2 severe obesity with serious comorbidity and body mass index (BMI) of 35.0 to 35.9 in adult, unspecified obesity type ([place])  PLAN:  Vitamin D Deficiency Blessyn was informed that low Vitamin D levels contributes to fatigue and are associated with obesity, breast, and colon cancer. She agrees to continue taking OTC Vit D and will follow-up for routine testing of Vitamin D, at least 2-3 times per year. She was informed of the risk of over-replacement of Vitamin D and agrees to not increase her dose unless she discusses this with [place] first. Teya agrees to follow-up with our clinic in 2 weeks.  Obesity Jaliah is currently in the action stage of change. As such, her goal is to continue with weight loss efforts She has agreed to follow the  Category 2 plan Jean has been instructed to work up to a goal of 150 minutes of combined cardio and strengthening exercise per week for weight loss and overall health benefits. We discussed the following Behavioral Modification Stratagies today: work on meal planning and easy cooking plans, and keeping healthy foods in the home.  Genifer has agreed to follow-up with our clinic in 2 weeks. She was informed of the importance of frequent follow-up visits to maximize her success with intensive lifestyle modifications for her multiple health conditions.  I spent > than 50% of the 25 minute visit on counseling as documented in the note.    ALLERGIES: Allergies  Allergen Reactions   Tree Extract Swelling and Other (See Comments)    Swelling to eyes   Augmentin [Amoxicillin-Pot Clavulanate] Other (See Comments)    Headache, dizzy   Lisinopril Cough    Face/throat swelling   Ciprofloxacin Hives    MEDICATIONS: Current Outpatient Medications on File Prior to Visit  Medication Sig Dispense Refill   acetaminophen (TYLENOL) 325 MG tablet Take 325 mg by mouth every 6 (six) hours as needed (FOR PAIN).     albuterol (PROVENTIL) (2.5 MG/3ML) 0.083% nebulizer solution USE 1 VIAL VIA NEBULIZER EVERY 6 HOURS AS NEEDED FOR WHEEZING OR SHORTNESS OF BREATH 75 mL 6   Ascorbic Acid (VITAMIN C) 1000 MG tablet Take 1,000 mg by mouth 2 (two) times a week.      azelastine (OPTIVAR) 0.05 % ophthalmic solution Place 2 drops into  both eyes 2 (two) times daily as needed (for swelling or itching).   3   BIKTARVY 50-200-25 MG TABS tablet TAKE 1 TABLET BY MOUTH DAILY (Patient taking differently: 1 tablet daily. ) 30 tablet 5   BIOTIN PO Take 1 tablet by mouth daily as needed (for supplementation).      budesonide-formoterol (SYMBICORT) 80-4.5 MCG/ACT inhaler Inhale 2 puffs into the lungs 2 (two) times daily as needed (shortness of breath).      Cholecalciferol (SM VITAMIN D3) 100 MCG (4000 UT) CAPS Take 1  capsule (4,000 Units total) by mouth daily. 30 capsule 0   cloNIDine (CATAPRES - DOSED IN MG/24 HR) 0.2 mg/24hr patch UNWRAP AND APPLY 1 PATCH TO SKIN ONCE A WEEK 4 patch 0   cyanocobalamin (,VITAMIN B-12,) 1000 MCG/ML injection ADMINISTER 1 ML(1000 MCG) IN THE MUSCLE EVERY 30 DAYS 1 mL 0   diclofenac sodium (VOLTAREN) 1 % GEL Apply 2 g topically 3 (three) times daily as needed (arm pain). 50 g 0   diltiazem (CARDIZEM CD) 240 MG 24 hr capsule Take 1 capsule (240 mg total) by mouth daily. 90 capsule 3   fluticasone (FLONASE) 50 MCG/ACT nasal spray Place 2 sprays into both nostrils daily. (Patient taking differently: Place 2 sprays into both nostrils daily as needed for allergies. ) 16 g 2   fluticasone furoate-vilanterol (BREO ELLIPTA) 200-25 MCG/INH AEPB Inhale 1 puff into the lungs daily. 1 each 0   furosemide (LASIX) 40 MG tablet Take 1 tablet (40 mg total) by mouth daily. 90 tablet 3   MAGNESIUM PO Take by mouth every other day.     Melatonin 5 MG TABS Take 5 mg by mouth at bedtime as needed (for sleep).     Misc. Devices (PULSE OXIMETER FOR FINGER) MISC 1 Units by Does not apply route as needed. 1 each 0   montelukast (SINGULAIR) 10 MG tablet Take 1 tablet (10 mg total) by mouth at bedtime. (Patient taking differently: Take 10 mg by mouth daily as needed (allergies). ) 30 tablet 2   Mouthwashes (BIOTENE DRY MOUTH GENTLE) LIQD Use as directed 1 Dose in the mouth or throat 2 (two) times daily. 1 Bottle 0   omeprazole (PRILOSEC) 40 MG capsule Take 1 capsule (40 mg total) by mouth daily. (Patient taking differently: Take 40 mg by mouth daily as needed Jerrye Bushy). ) 90 capsule 3   OXYGEN Inhale 1.5-2 L into the lungs continuous.      polyethylene glycol (MIRALAX / GLYCOLAX) packet Take 17 g by mouth daily. (Patient taking differently: Take 17 g by mouth daily as needed for mild constipation. ) 14 each 0   potassium chloride SA (K-DUR,KLOR-CON) 20 MEQ tablet Take 20 mEq by mouth every other  day.     PREVIDENT 5000 SENSITIVE 1.1-5 % PSTE Use daily as directed  1   PROAIR HFA 108 (90 Base) MCG/ACT inhaler INHALE 2 PUFFS INTO THE LUNGS EVERY 4 HOURS AS NEEDED FOR WHEEZING OR SHORTNESS OF BREATH 8.5 g 6   rivaroxaban (XARELTO) 20 MG TABS tablet Take 1 tablet (20 mg total) by mouth daily with supper. 30 tablet 1   senna-docusate (SENOKOT-S) 8.6-50 MG tablet Take 1 tablet by mouth at bedtime as needed for mild constipation. 30 tablet 0   sodium chloride (OCEAN) 0.65 % SOLN nasal spray Place 1 spray into both nostrils as needed for congestion. 88 mL 0   spironolactone (ALDACTONE) 25 MG tablet Take 1 tablet (25 mg total) by mouth daily. [place]  tablet 11   Tetrahydrozoline HCl (VISINE OP) Place 2 drops into both eyes daily as needed (for dry eyes).      No current facility-administered medications on file prior to visit.     PAST MEDICAL HISTORY: Past Medical History:  Diagnosis Date   Anemia    Anxiety    HX PANIC ATTACKS   Arthritis    "starting to; in my hands" (07/09/2015)   Asthma    Atrial fibrillation (HCC)    Atrial flutter, paroxysmal (HCC)    Bloated abdomen    CFS (chronic fatigue syndrome)    Chewing difficulty    Chronic asthma with acute exacerbation    "I have chronic asthma all the time; sometimes exacerbations" (07/09/2015)   Chronic lower back pain    COPD (chronic obstructive pulmonary disease) ([place])    Cyst of right kidney    "3 of them; dx'd in ~ 01/2015"   Dyspnea    GERD (gastroesophageal reflux disease)    Heart murmur    History of blood transfusion    "related to my brain surgery I think"   History of pulmonary embolism 07/09/2015   HIV antibody positive ([place])    HIV disease ([place])    Hyperlipidemia    Hypertension    Leg edema    Lipodystrophy    Mild CAD 2013   Palpitations    Pneumonia 07/09/2015   Shingles    Sleep apnea    "never completed part 2 of study; never wore mask" (07/09/2015)   Vitamin B 12  deficiency    Vitamin D deficiency     PAST SURGICAL HISTORY: Past Surgical History:  Procedure Laterality Date   ABDOMINAL HYSTERECTOMY     "robotic laparosopic"   [place]   "brain tumor; benign; on top of my brain; got a plate in there"   CARDIAC CATHETERIZATION     TONSILLECTOMY AND ADENOIDECTOMY      SOCIAL HISTORY: Social History   Tobacco Use   Smoking status: Never Smoker   Smokeless tobacco: Never Used  Substance Use Topics   Alcohol use: Yes    Alcohol/week: 0.0 standard drinks    Comment: Rarely.   Drug use: No    FAMILY HISTORY: Family History  Problem Relation Age of Onset   Asthma Mother    Heart failure Mother        cardiomyopathy   Thyroid disease Mother    Heart disease Mother    Obesity Mother    Heart murmur Sister    Heart murmur Brother    Diabetes Sister    Thyroid disease Sister    Heart murmur Sister    ROS: Review of Systems  Gastrointestinal: Negative for nausea and vomiting.  Musculoskeletal:       Negative for muscle weakness.   PHYSICAL EXAM: Pt in no acute distress  RECENT LABS AND TESTS: BMET    Component Value Date/Time   NA 145 (H) 02/23/2019 1100   K 4.0 02/23/2019 1100   CL 102 02/23/2019 1100   CO2 25 02/23/2019 1100   GLUCOSE 98 02/23/2019 1100   GLUCOSE 150 (H) 02/05/2019 0951   BUN 17 02/23/2019 1100   CREATININE 1.27 (H) 02/23/2019 1100   CREATININE 1.77 (H) 11/03/2018 1430   CALCIUM 9.9 02/23/2019 1100   GFRNONAA 45 (L) 02/23/2019 1100   GFRNONAA 30 (L) 11/03/2018 1430   GFRAA 52 (L) 02/23/2019 1100   GFRAA 35 (L) 11/03/2018 1430   Lab Results  Component Value Date   HGBA1C 5.6 06/30/2018   HGBA1C 5.5 02/23/2018   HGBA1C 5.9 (H) 01/05/2017   Lab Results  Component Value Date   INSULIN 26.4 (H) 06/30/2018   CBC    Component Value Date/Time   WBC 5.3 02/23/2019 1100   WBC 5.9 02/03/2019 0813   RBC 4.07 02/23/2019 1100   RBC 4.06 02/03/2019 0813   HGB 12.3  02/23/2019 1100   HCT 36.3 02/23/2019 1100   PLT 181 02/23/2019 1100   MCV 89 02/23/2019 1100   MCH 30.2 02/23/2019 1100   MCH 29.8 02/03/2019 0813   MCHC 33.9 02/23/2019 1100   MCHC 31.7 02/03/2019 0813   RDW 13.3 02/23/2019 1100   LYMPHSABS 1.4 02/03/2019 0813   LYMPHSABS 1.2 06/29/2018 0942   MONOABS 0.4 02/03/2019 0813   EOSABS 0.1 02/03/2019 0813   EOSABS 0.1 06/29/2018 0942   BASOSABS 0.0 02/03/2019 0813   BASOSABS 0.0 06/29/2018 0942   Iron/TIBC/Ferritin/ %Sat No results found for: IRON, TIBC, FERRITIN, IRONPCTSAT Lipid Panel     Component Value Date/Time   CHOL 253 (H) 05/03/2018 1043   TRIG 53 05/03/2018 1043   HDL 86 05/03/2018 1043   CHOLHDL 2.9 05/03/2018 1043   CHOLHDL 3.2 11/26/2016 1618   VLDL 18 11/26/2016 1618   LDLCALC 156 (H) 05/03/2018 1043   Hepatic Function Panel     Component Value Date/Time   PROT 7.2 02/03/2019 0813   PROT 6.7 06/30/2018 1016   ALBUMIN 3.9 02/03/2019 0813   ALBUMIN 4.1 06/30/2018 1016   AST 26 02/03/2019 0813   ALT 20 02/03/2019 0813   ALKPHOS 69 02/03/2019 0813   BILITOT 0.5 02/03/2019 0813   BILITOT 0.3 06/30/2018 1016   BILIDIR <0.1 (L) 08/25/2017 0700   IBILI NOT CALCULATED 08/25/2017 0700      Component Value Date/Time   TSH 0.411 (L) 06/30/2018 1016   TSH 0.475 08/25/2017 0700   TSH 0.690 08/08/2015 1345   Results for AALIYANA, FREDERICKS (MRN [id number]) as of 04/04/2019 10:40  Ref. Range 06/30/2018 10:16  Vitamin D, 25-Hydroxy Latest Ref Range: 30.0 - 100.0 ng/mL 36.5   I, Michaelene Song, am acting as Location manager for [employer], PA-C I, Abby Potash, PA-C have reviewed above note and agree with its content

## 2019-04-07 DIAGNOSIS — N281 Cyst of kidney, acquired: Secondary | ICD-10-CM | POA: Diagnosis not present

## 2019-04-07 DIAGNOSIS — I4892 Unspecified atrial flutter: Secondary | ICD-10-CM | POA: Diagnosis not present

## 2019-04-07 DIAGNOSIS — E785 Hyperlipidemia, unspecified: Secondary | ICD-10-CM | POA: Diagnosis not present

## 2019-04-07 DIAGNOSIS — K5900 Constipation, unspecified: Secondary | ICD-10-CM | POA: Diagnosis not present

## 2019-04-07 DIAGNOSIS — N183 Chronic kidney disease, stage 3 (moderate): Secondary | ICD-10-CM | POA: Diagnosis not present

## 2019-04-07 DIAGNOSIS — G4710 Hypersomnia, unspecified: Secondary | ICD-10-CM | POA: Diagnosis not present

## 2019-04-07 DIAGNOSIS — E639 Nutritional deficiency, unspecified: Secondary | ICD-10-CM | POA: Diagnosis not present

## 2019-04-07 DIAGNOSIS — I77819 Aortic ectasia, unspecified site: Secondary | ICD-10-CM | POA: Diagnosis not present

## 2019-04-07 DIAGNOSIS — M25511 Pain in right shoulder: Secondary | ICD-10-CM | POA: Diagnosis not present

## 2019-04-07 DIAGNOSIS — I313 Pericardial effusion (noninflammatory): Secondary | ICD-10-CM | POA: Diagnosis not present

## 2019-04-07 DIAGNOSIS — J449 Chronic obstructive pulmonary disease, unspecified: Secondary | ICD-10-CM | POA: Diagnosis not present

## 2019-04-07 DIAGNOSIS — I2510 Atherosclerotic heart disease of native coronary artery without angina pectoris: Secondary | ICD-10-CM | POA: Diagnosis not present

## 2019-04-07 DIAGNOSIS — J309 Allergic rhinitis, unspecified: Secondary | ICD-10-CM | POA: Diagnosis not present

## 2019-04-07 DIAGNOSIS — G4733 Obstructive sleep apnea (adult) (pediatric): Secondary | ICD-10-CM | POA: Diagnosis not present

## 2019-04-07 DIAGNOSIS — M5412 Radiculopathy, cervical region: Secondary | ICD-10-CM | POA: Diagnosis not present

## 2019-04-07 DIAGNOSIS — E538 Deficiency of other specified B group vitamins: Secondary | ICD-10-CM | POA: Diagnosis not present

## 2019-04-07 DIAGNOSIS — J4541 Moderate persistent asthma with (acute) exacerbation: Secondary | ICD-10-CM | POA: Diagnosis not present

## 2019-04-07 DIAGNOSIS — E559 Vitamin D deficiency, unspecified: Secondary | ICD-10-CM | POA: Diagnosis not present

## 2019-04-07 DIAGNOSIS — I080 Rheumatic disorders of both mitral and aortic valves: Secondary | ICD-10-CM | POA: Diagnosis not present

## 2019-04-07 DIAGNOSIS — K219 Gastro-esophageal reflux disease without esophagitis: Secondary | ICD-10-CM | POA: Diagnosis not present

## 2019-04-07 DIAGNOSIS — I130 Hypertensive heart and chronic kidney disease with heart failure and stage 1 through stage 4 chronic kidney disease, or unspecified chronic kidney disease: Secondary | ICD-10-CM | POA: Diagnosis not present

## 2019-04-07 DIAGNOSIS — I5033 Acute on chronic diastolic (congestive) heart failure: Secondary | ICD-10-CM | POA: Diagnosis not present

## 2019-04-13 DIAGNOSIS — R269 Unspecified abnormalities of gait and mobility: Secondary | ICD-10-CM | POA: Diagnosis not present

## 2019-04-13 DIAGNOSIS — J45909 Unspecified asthma, uncomplicated: Secondary | ICD-10-CM | POA: Diagnosis not present

## 2019-04-13 DIAGNOSIS — J449 Chronic obstructive pulmonary disease, unspecified: Secondary | ICD-10-CM | POA: Diagnosis not present

## 2019-04-13 DIAGNOSIS — G4733 Obstructive sleep apnea (adult) (pediatric): Secondary | ICD-10-CM | POA: Diagnosis not present

## 2019-04-14 ENCOUNTER — Encounter: Payer: Self-pay | Admitting: Internal Medicine

## 2019-04-14 ENCOUNTER — Ambulatory Visit (INDEPENDENT_AMBULATORY_CARE_PROVIDER_SITE_OTHER): Payer: Medicare Other | Admitting: Internal Medicine

## 2019-04-14 ENCOUNTER — Other Ambulatory Visit: Payer: Self-pay

## 2019-04-14 DIAGNOSIS — I4892 Unspecified atrial flutter: Secondary | ICD-10-CM

## 2019-04-14 DIAGNOSIS — N183 Chronic kidney disease, stage 3 unspecified: Secondary | ICD-10-CM

## 2019-04-14 DIAGNOSIS — J018 Other acute sinusitis: Secondary | ICD-10-CM | POA: Diagnosis not present

## 2019-04-14 DIAGNOSIS — I872 Venous insufficiency (chronic) (peripheral): Secondary | ICD-10-CM

## 2019-04-14 DIAGNOSIS — E669 Obesity, unspecified: Secondary | ICD-10-CM

## 2019-04-14 DIAGNOSIS — E538 Deficiency of other specified B group vitamins: Secondary | ICD-10-CM

## 2019-04-14 DIAGNOSIS — K219 Gastro-esophageal reflux disease without esophagitis: Secondary | ICD-10-CM

## 2019-04-14 DIAGNOSIS — I2510 Atherosclerotic heart disease of native coronary artery without angina pectoris: Secondary | ICD-10-CM

## 2019-04-14 DIAGNOSIS — Z21 Asymptomatic human immunodeficiency virus [HIV] infection status: Secondary | ICD-10-CM

## 2019-04-14 DIAGNOSIS — Z6836 Body mass index (BMI) 36.0-36.9, adult: Secondary | ICD-10-CM

## 2019-04-14 DIAGNOSIS — I130 Hypertensive heart and chronic kidney disease with heart failure and stage 1 through stage 4 chronic kidney disease, or unspecified chronic kidney disease: Secondary | ICD-10-CM

## 2019-04-14 DIAGNOSIS — Z79899 Other long term (current) drug therapy: Secondary | ICD-10-CM

## 2019-04-14 DIAGNOSIS — I5030 Unspecified diastolic (congestive) heart failure: Secondary | ICD-10-CM

## 2019-04-14 DIAGNOSIS — J0111 Acute recurrent frontal sinusitis: Secondary | ICD-10-CM

## 2019-04-14 DIAGNOSIS — I5032 Chronic diastolic (congestive) heart failure: Secondary | ICD-10-CM

## 2019-04-14 DIAGNOSIS — I10 Essential (primary) hypertension: Secondary | ICD-10-CM

## 2019-04-14 MED ORDER — AMOXICILLIN 500 MG PO CAPS: 500.0000 mg | ORAL_CAPSULE | Freq: Three times a day (TID) | ORAL | 0 refills | Status: DC

## 2019-04-14 MED ORDER — "BD SYRINGE/NEEDLE 25G X 5/8"" 1 ML MISC": 1.0000 [IU] | 0 refills | Status: AC

## 2019-04-14 NOTE — Assessment & Plan Note (Signed)
She continues to be uncontrolled and labile.  She has well controlled pressures at home per her.  She has been controlled in the past on current regimen, but is is very labile.  She also is on spironolactone, lasix and diltiazem, which she reports compliance with.   I reiterated the importance of good BP control.  She feels that her control is not as good because of her worsening illness and not due to adherence issues.   Plan Continue clonidine patch, diltiazem, spironolactone, lasix Check at next visit Consider home blood pressure monitoring at next visit.

## 2019-04-14 NOTE — Assessment & Plan Note (Signed)
At last check, B12 was high.  He feels better being on the medication as not.  She would like to continue injections.   Refilled needles for injections.

## 2019-04-14 NOTE — Assessment & Plan Note (Signed)
She is doing well. She is watching how much fluid and salt she takes in.  She is taking lasix and spironolactone per report (she did not bring in her medications).  Her BP is elevated today which we discussed.  She will need good control going forward.  She states at home that it is much lower.  She is wearing her clonidine patch today.   Plan Continue diuretics Follow up with Cardiology at next planned visit.

## 2019-04-14 NOTE — Progress Notes (Signed)
Subjective:    Patient ID: Shannon [name], female    DOB: Oct 22, 1955, 63 y.o.   MRN: [id number]  CC: Sinus congestion, worried about an infection  HPI  Ms. Schupp is a 63 year old woman with PMH of HTN, HFpEF, chronic SOB, CKD, Aflutter, GERD, HIV (well controlled) who presented for chronic follow up with an acute complaint of sinus congestion.   Today, Ms. Koelling is hypertensive into the 160s.  She says this only happens when she is getting ill.  She has been having sinus congestion, post nasal drip, change in voice, green phlegm.  She has not had a fever, but does think she has had chills.  She has not been around anyone sick.  She has not been around anyone with COVID-19.  She further notes sinus pain, puffy eyes, frontal headaches, decreased ability to sleep, nightmares.  She has taken mucinex which helps a bit and flonase which does not seem to help.  She feels that there is mold in her air conditioning ducts.  She and her neighbors (townhome) have complained and hope to have them fixed.   Otherwise, she is doing well.  Her BP is high today, but when she had PT the other day it was 123/80.  Her LE edema is much better.  She has seen Dr. Oneida Alar in Vascular surgery who did not think she needed surgery, but will continue to see her.  She continues to take vitamin B12 injections and is requesting a refill.    Review of Systems  Constitutional: Positive for chills. Negative for activity change and fever.  HENT: Positive for congestion, facial swelling, postnasal drip, rhinorrhea, sinus pressure, sinus pain, sore throat and voice change. Negative for ear discharge and ear pain.   Eyes: Negative for visual disturbance.  Respiratory: Positive for cough and shortness of breath.   Cardiovascular: Positive for leg swelling (mild). Negative for chest pain and palpitations.  Genitourinary: Negative for difficulty urinating and dysuria.  Neurological: Negative for dizziness and weakness.   Psychiatric/Behavioral: Negative for decreased concentration and dysphoric mood.       Objective:   Physical Exam Constitutional:      Appearance: She is obese. She is not ill-appearing or toxic-appearing.  HENT:     Head: Normocephalic and atraumatic.     Nose: Nasal tenderness, mucosal edema, congestion and rhinorrhea present. No nasal deformity, septal deviation or signs of injury. Rhinorrhea is clear.     Right Nostril: No foreign body, epistaxis, septal hematoma or occlusion.     Left Nostril: No foreign body, epistaxis, septal hematoma or occlusion.     Right Turbinates: Enlarged and swollen.     Left Turbinates: Enlarged and swollen.     Right Sinus: Maxillary sinus tenderness and frontal sinus tenderness present.     Left Sinus: Maxillary sinus tenderness and frontal sinus tenderness present.     Mouth/Throat:     Comments: Cobblestoning in back of throat, no exudate  Eyes:     General: No scleral icterus.    Pupils: Pupils are equal, round, and reactive to light.  Neck:     Musculoskeletal: No neck rigidity or muscular tenderness.  Cardiovascular:     Rate and Rhythm: Normal rate and regular rhythm.     Pulses: Normal pulses.     Heart sounds: No murmur.  Pulmonary:     Effort: Pulmonary effort is normal.     Comments: She has decreased breath sounds at baseline and it was similar  today. No wheezing or rhonchi Musculoskeletal:        General: No tenderness or signs of injury.     Right lower leg: Edema present.     Left lower leg: Edema present.  Skin:    General: Skin is warm and dry.     Comments: Erythema consistent with venous insufficiency changes to mid calf bilaterally  Neurological:     Mental Status: She is alert and oriented to person, place, and time. Mental status is at baseline.  Psychiatric:        Mood and Affect: Mood normal.    No labs today.       Assessment & Plan:  RTC in 2-3 months.

## 2019-04-14 NOTE — Assessment & Plan Note (Signed)
She has no complaints of heartburn today.  She is on omeprazole, but only takes it PRN.  No chest pain or other issues today.   Plan Continue PRN omeprazole.

## 2019-04-14 NOTE — Assessment & Plan Note (Signed)
She is following with a health and wellness group and quite enjoys the visits.  She is committed to working on her weight.

## 2019-04-14 NOTE — Patient Instructions (Signed)
Ms. Kautzman - -  I am sorry you are not feeling well!  Please take Amoxicillin 500mg  three times a day for 10 days.   Call the clinic in 2 weeks if you are not doing better.   Come back to see me in 2-3 months.    Thank you!

## 2019-04-14 NOTE — Assessment & Plan Note (Signed)
She is very concerned about getting an infection.  She does seem to have changes to her nasal mucosa and throat.  She has very tender maxillary and frontal sinuses.  She notes that Augmentin is "too strong" for her.   Plan Amoxicillin 500mg  TID for 10 days She will call the clinic if no better.

## 2019-04-14 NOTE — Assessment & Plan Note (Signed)
She has no chest pain, dizziness today.  She does have HFpEF which is well controlled today.  Continue to monitor and follow up with cardiology as planned.

## 2019-04-14 NOTE — Assessment & Plan Note (Signed)
Her last Cr was 1.27, which is improved from previous levels and around baseline.  She is on lasix and spironolactone.  Consider checking Cr at next visit.   BMET at next visit.

## 2019-04-18 ENCOUNTER — Encounter (INDEPENDENT_AMBULATORY_CARE_PROVIDER_SITE_OTHER): Payer: Self-pay | Admitting: Physician Assistant

## 2019-04-18 ENCOUNTER — Other Ambulatory Visit: Payer: Self-pay

## 2019-04-18 ENCOUNTER — Telehealth (INDEPENDENT_AMBULATORY_CARE_PROVIDER_SITE_OTHER): Payer: Medicare Other | Admitting: Physician Assistant

## 2019-04-18 DIAGNOSIS — E559 Vitamin D deficiency, unspecified: Secondary | ICD-10-CM | POA: Diagnosis not present

## 2019-04-18 DIAGNOSIS — Z6835 Body mass index (BMI) 35.0-35.9, adult: Secondary | ICD-10-CM

## 2019-04-18 MED ORDER — SM VITAMIN D3 100 MCG (4000 UT) PO CAPS: 1.0000 | ORAL_CAPSULE | Freq: Every day | ORAL | 0 refills | Status: DC

## 2019-04-19 ENCOUNTER — Other Ambulatory Visit: Payer: Self-pay | Admitting: Internal Medicine

## 2019-04-19 NOTE — Progress Notes (Signed)
Office: [phone]  /  Fax: [phone] TeleHealth Visit:  Shannon [name] has verbally consented to this TeleHealth visit today. The patient is located at home, the provider is located at the [facility] office. The participants in this visit include the listed provider and patient and any and all parties involved. The visit was conducted today via WebEx.  HPI:   Chief Complaint: OBESITY Shannon [name] is here to discuss her progress with her obesity treatment plan. She is on the Category 2 plan and is following her eating plan approximately 40 % of the time. She states she is exercising 0 minutes 0 times per week. Shannon [name] reports her most recent weight is 226 pounds (04/18/19). She is only eating once daily but is snacking periodically. She states her appetite is low because of her HIV medications. We were unable to weigh the patient today for this TeleHealth visit. She feels as if she has gained weight since her last visit. She has lost 2 lbs since starting treatment with [place].  Vitamin D deficiency Shannon [name] has a diagnosis of vitamin D deficiency. She is currently taking vit D and denies nausea, vomiting or muscle weakness.  ASSESSMENT AND PLAN:  Vitamin D deficiency - Plan: Cholecalciferol (SM VITAMIN D3) 100 MCG (4000 UT) CAPS  Class 2 severe obesity with serious comorbidity and body mass index (BMI) of 35.0 to 35.9 in adult, unspecified obesity type ([place])  PLAN:  Vitamin D Deficiency Shannon [name] was informed that low vitamin D levels contributes to fatigue and are associated with obesity, breast, and colon cancer. She agrees to take vitamin D 4,000 IU daily #30 with no refills and will follow up for routine testing of vitamin D, at least 2-3 times per year. She was informed of the risk of over-replacement of vitamin D and agrees to not increase her dose unless she discusses this with [place] first. Shannon [name] agrees to follow up as directed.  Obesity Shannon [name] is currently in the action  stage of change. As such, her goal is to continue with weight loss efforts She has agreed to follow the Category 2 plan Shannon [name] has been instructed to work up to a goal of 150 minutes of combined cardio and strengthening exercise per week for weight loss and overall health benefits. We discussed the following Behavioral Modification Strategies today: work on meal planning and easy cooking plans Bette has agreed to follow up with our clinic in 2 weeks. She was informed of the importance of frequent follow up visits to maximize her success with intensive lifestyle modifications for her multiple health conditions.  I spent > than 50% of the 25 minute visit on counseling as documented in the note.    ALLERGIES: Allergies  Allergen Reactions   Tree Extract Swelling and Other (See Comments)    Swelling to eyes   Augmentin [Amoxicillin-Pot Clavulanate] Other (See Comments)    Headache, dizzy   Lisinopril Cough    Face/throat swelling   Ciprofloxacin Hives    MEDICATIONS: Current Outpatient Medications on File Prior to Visit  Medication Sig Dispense Refill   acetaminophen (TYLENOL) 325 MG tablet Take 325 mg by mouth every 6 (six) hours as needed (FOR PAIN).     albuterol (PROVENTIL) (2.5 MG/3ML) 0.083% nebulizer solution USE 1 VIAL VIA NEBULIZER EVERY 6 HOURS AS NEEDED FOR WHEEZING OR SHORTNESS OF BREATH 75 mL 6   amoxicillin (AMOXIL) 500 MG capsule Take 1 capsule (500 mg total) by mouth 3 (three) times daily for 10 days. [place]  capsule 0   Ascorbic Acid (VITAMIN C) 1000 MG tablet Take 1,000 mg by mouth 2 (two) times a week.      azelastine (OPTIVAR) 0.05 % ophthalmic solution Place 2 drops into both eyes 2 (two) times daily as needed (for swelling or itching).   3   BIOTIN PO Take 1 tablet by mouth daily as needed (for supplementation).      budesonide-formoterol (SYMBICORT) 80-4.5 MCG/ACT inhaler Inhale 2 puffs into the lungs 2 (two) times daily as needed (shortness of breath).        cloNIDine (CATAPRES - DOSED IN MG/24 HR) 0.2 mg/24hr patch UNWRAP AND APPLY 1 PATCH TO SKIN ONCE A WEEK 4 patch 0   cyanocobalamin (,VITAMIN B-12,) 1000 MCG/ML injection ADMINISTER 1 ML(1000 MCG) IN THE MUSCLE EVERY 30 DAYS 1 mL 0   diclofenac sodium (VOLTAREN) 1 % GEL Apply 2 g topically 3 (three) times daily as needed (arm pain). 50 g 0   diltiazem (CARDIZEM CD) 240 MG 24 hr capsule Take 1 capsule (240 mg total) by mouth daily. 90 capsule 3   fluticasone (FLONASE) 50 MCG/ACT nasal spray Place 2 sprays into both nostrils daily. (Patient taking differently: Place 2 sprays into both nostrils daily as needed for allergies. ) 16 g 2   fluticasone furoate-vilanterol (BREO ELLIPTA) 200-25 MCG/INH AEPB Inhale 1 puff into the lungs daily. 1 each 0   furosemide (LASIX) 40 MG tablet Take 1 tablet (40 mg total) by mouth daily. 90 tablet 3   MAGNESIUM PO Take by mouth every other day.     Melatonin 5 MG TABS Take 5 mg by mouth at bedtime as needed (for sleep).     Misc. Devices (PULSE OXIMETER FOR FINGER) MISC 1 Units by Does not apply route as needed. 1 each 0   montelukast (SINGULAIR) 10 MG tablet Take 1 tablet (10 mg total) by mouth at bedtime. (Patient taking differently: Take 10 mg by mouth daily as needed (allergies). ) 30 tablet 2   Mouthwashes (BIOTENE DRY MOUTH GENTLE) LIQD Use as directed 1 Dose in the mouth or throat 2 (two) times daily. 1 Bottle 0   omeprazole (PRILOSEC) 40 MG capsule Take 1 capsule (40 mg total) by mouth daily. (Patient taking differently: Take 40 mg by mouth daily as needed Jerrye Bushy). ) 90 capsule 3   OXYGEN Inhale 1.5-2 L into the lungs continuous.      polyethylene glycol (MIRALAX / GLYCOLAX) packet Take 17 g by mouth daily. (Patient taking differently: Take 17 g by mouth daily as needed for mild constipation. ) 14 each 0   potassium chloride SA (K-DUR,KLOR-CON) 20 MEQ tablet Take 20 mEq by mouth every other day.     PREVIDENT 5000 SENSITIVE 1.1-5 % PSTE Use  daily as directed  1   PROAIR HFA 108 (90 Base) MCG/ACT inhaler INHALE 2 PUFFS INTO THE LUNGS EVERY 4 HOURS AS NEEDED FOR WHEEZING OR SHORTNESS OF BREATH 8.5 g 6   rivaroxaban (XARELTO) 20 MG TABS tablet Take 1 tablet (20 mg total) by mouth daily with supper. 30 tablet 1   senna-docusate (SENOKOT-S) 8.6-50 MG tablet Take 1 tablet by mouth at bedtime as needed for mild constipation. 30 tablet 0   sodium chloride (OCEAN) 0.65 % SOLN nasal spray Place 1 spray into both nostrils as needed for congestion. 88 mL 0   spironolactone (ALDACTONE) 25 MG tablet Take 1 tablet (25 mg total) by mouth daily. 30 tablet 11   SYRINGE/NEEDLE, DISP, 1 ML (B-D  SYRINGE/NEEDLE 1CC/25GX5/8) 25G X 5/8" 1 ML MISC 1 Units by Does not apply route every 30 (thirty) days. 50 each 0   No current facility-administered medications on file prior to visit.     PAST MEDICAL HISTORY: Past Medical History:  Diagnosis Date   Anemia    Anxiety    HX PANIC ATTACKS   Arthritis    "starting to; in my hands" (07/09/2015)   Asthma    Atrial fibrillation (HCC)    Atrial flutter, paroxysmal (HCC)    Bloated abdomen    CFS (chronic fatigue syndrome)    Chewing difficulty    Chronic asthma with acute exacerbation    "I have chronic asthma all the time; sometimes exacerbations" (07/09/2015)   Chronic lower back pain    COPD (chronic obstructive pulmonary disease) ([place])    Cyst of right kidney    "3 of them; dx'd in ~ 01/2015"   Dyspnea    GERD (gastroesophageal reflux disease)    Heart murmur    History of blood transfusion    "related to my brain surgery I think"   History of pulmonary embolism 07/09/2015   HIV antibody positive ([place])    HIV disease ([place])    Hyperlipidemia    Hypertension    Leg edema    Lipodystrophy    Mild CAD 2013   Palpitations    Pneumonia 07/09/2015   Shingles    Sleep apnea    "never completed part 2 of study; never wore mask" (07/09/2015)   Vitamin B 12  deficiency    Vitamin D deficiency     PAST SURGICAL HISTORY: Past Surgical History:  Procedure Laterality Date   ABDOMINAL HYSTERECTOMY     "robotic laparosopic"   [place]   "brain tumor; benign; on top of my brain; got a plate in there"   CARDIAC CATHETERIZATION     TONSILLECTOMY AND ADENOIDECTOMY      SOCIAL HISTORY: Social History   Tobacco Use   Smoking status: Never Smoker   Smokeless tobacco: Never Used  Substance Use Topics   Alcohol use: Yes    Alcohol/week: 0.0 standard drinks    Comment: Rarely.   Drug use: No    FAMILY HISTORY: Family History  Problem Relation Age of Onset   Asthma Mother    Heart failure Mother        cardiomyopathy   Thyroid disease Mother    Heart disease Mother    Obesity Mother    Heart murmur Sister    Heart murmur Brother    Diabetes Sister    Thyroid disease Sister    Heart murmur Sister     ROS: Review of Systems  Constitutional: Negative for weight loss.  Gastrointestinal: Negative for nausea and vomiting.  Musculoskeletal:       Negative for muscle weakness    PHYSICAL EXAM: Pt in no acute distress  RECENT LABS AND TESTS: BMET    Component Value Date/Time   NA 145 (H) 02/23/2019 1100   K 4.0 02/23/2019 1100   CL 102 02/23/2019 1100   CO2 25 02/23/2019 1100   GLUCOSE 98 02/23/2019 1100   GLUCOSE 150 (H) 02/05/2019 0951   BUN 17 02/23/2019 1100   CREATININE 1.27 (H) 02/23/2019 1100   CREATININE 1.77 (H) 11/03/2018 1430   CALCIUM 9.9 02/23/2019 1100   GFRNONAA 45 (L) 02/23/2019 1100   GFRNONAA 30 (L) 11/03/2018 1430   GFRAA 52 (L) 02/23/2019 1100   GFRAA 35 (  L) 11/03/2018 1430   Lab Results  Component Value Date   HGBA1C 5.6 06/30/2018   HGBA1C 5.5 02/23/2018   HGBA1C 5.9 (H) 01/05/2017   Lab Results  Component Value Date   INSULIN 26.4 (H) 06/30/2018   CBC    Component Value Date/Time   WBC 5.3 02/23/2019 1100   WBC 5.9 02/03/2019 0813   RBC 4.07 02/23/2019  1100   RBC 4.06 02/03/2019 0813   HGB 12.3 02/23/2019 1100   HCT 36.3 02/23/2019 1100   PLT 181 02/23/2019 1100   MCV 89 02/23/2019 1100   MCH 30.2 02/23/2019 1100   MCH 29.8 02/03/2019 0813   MCHC 33.9 02/23/2019 1100   MCHC 31.7 02/03/2019 0813   RDW 13.3 02/23/2019 1100   LYMPHSABS 1.4 02/03/2019 0813   LYMPHSABS 1.2 06/29/2018 0942   MONOABS 0.4 02/03/2019 0813   EOSABS 0.1 02/03/2019 0813   EOSABS 0.1 06/29/2018 0942   BASOSABS 0.0 02/03/2019 0813   BASOSABS 0.0 06/29/2018 0942   Iron/TIBC/Ferritin/ %Sat No results found for: IRON, TIBC, FERRITIN, IRONPCTSAT Lipid Panel     Component Value Date/Time   CHOL 253 (H) 05/03/2018 1043   TRIG 53 05/03/2018 1043   HDL 86 05/03/2018 1043   CHOLHDL 2.9 05/03/2018 1043   CHOLHDL 3.2 11/26/2016 1618   VLDL 18 11/26/2016 1618   LDLCALC 156 (H) 05/03/2018 1043   Hepatic Function Panel     Component Value Date/Time   PROT 7.2 02/03/2019 0813   PROT 6.7 06/30/2018 1016   ALBUMIN 3.9 02/03/2019 0813   ALBUMIN 4.1 06/30/2018 1016   AST 26 02/03/2019 0813   ALT 20 02/03/2019 0813   ALKPHOS 69 02/03/2019 0813   BILITOT 0.5 02/03/2019 0813   BILITOT 0.3 06/30/2018 1016   BILIDIR <0.1 (L) 08/25/2017 0700   IBILI NOT CALCULATED 08/25/2017 0700      Component Value Date/Time   TSH 0.411 (L) 06/30/2018 1016   TSH 0.475 08/25/2017 0700   TSH 0.690 08/08/2015 1345     Ref. Range 06/30/2018 10:16  Vitamin D, 25-Hydroxy Latest Ref Range: 30.0 - 100.0 ng/mL 36.5    I, Doreene Nest, am acting as Location manager for [employer]. Pricilla Holm, PA-C I, Abby Potash, PA-C have reviewed above note and agree with its content

## 2019-04-20 ENCOUNTER — Other Ambulatory Visit: Payer: Self-pay | Admitting: Internal Medicine

## 2019-04-20 DIAGNOSIS — E538 Deficiency of other specified B group vitamins: Secondary | ICD-10-CM

## 2019-05-01 ENCOUNTER — Other Ambulatory Visit: Payer: Self-pay | Admitting: Internal Medicine

## 2019-05-03 ENCOUNTER — Ambulatory Visit: Payer: Medicare Other | Admitting: Internal Medicine

## 2019-05-03 ENCOUNTER — Telehealth (INDEPENDENT_AMBULATORY_CARE_PROVIDER_SITE_OTHER): Payer: Self-pay | Admitting: Psychology

## 2019-05-03 DIAGNOSIS — F3289 Other specified depressive episodes: Secondary | ICD-10-CM

## 2019-05-03 NOTE — Telephone Encounter (Signed)
  Office: [phone]  /  Fax: [phone]  Date of Call:  May 03, 2019 Time of Call: 3:43pm Duration of Call: 14 minutes Provider: Glennie Isle, PsyD  CONTENT: This provider called Mumtaz to check-in and provide the phone number for billing financial assistance as she currently has a $50 balance secondary to missed appointments with this provider. Skilynn shared she has been dealing with ongoing medical concerns and further noted, "I had a death in my family." She also explained she missed appointments with this provider due to the ongoing medical concerns. Jaiya further reported a reduction in social interactions due to COVID-19 and living alone. As such, this provider again recommended longer-term therapeutic services due to ongoing concerns (e.g., medical, family loss, COVID-19). Benita was receptive to a referral as evidenced by her stating, "Every little bit helps. I would do that." She added, ""I'm not going to say no. I'm open." Aristea provided verbal consent for this provider to place a referral to address ongoing medical concerns, grief, emotional eating, and stressors due to COVID-19. Based on her current medical concerns and frequent medical appointments, Ayelet indicated a plan to continue therapeutic services with the new provider based on the referral placed by this provider. Moreover, a brief risk assessment was completed. Brittanni denied experiencing suicidal and homicidal ideation, plan, or intent since the last speaking with this provider. Overall, Maryah was receptive to this provider calling today as evidenced by her stating, "This gave me a morale boost."  PLAN: This provider will place a referral for therapeutic services with [facility]. No further follow-up planned by this provider.

## 2019-05-05 ENCOUNTER — Telehealth: Payer: Self-pay | Admitting: Cardiovascular Disease

## 2019-05-05 NOTE — Telephone Encounter (Signed)
Follow Up:     Naila from [employer] [employer] called. She wanted to know if you received a fax from 04-21-19? It  was for a recommendation for doctor to recommend a Statin.

## 2019-05-05 NOTE — Telephone Encounter (Signed)
This is Dr. Bowman's pt.  °

## 2019-05-09 ENCOUNTER — Other Ambulatory Visit: Payer: Self-pay | Admitting: Internal Medicine

## 2019-05-09 DIAGNOSIS — E538 Deficiency of other specified B group vitamins: Secondary | ICD-10-CM

## 2019-05-09 NOTE — Telephone Encounter (Signed)
Spoke with pharmacy and advised. Per pharmacy must have been faxed by call center

## 2019-05-09 NOTE — Telephone Encounter (Signed)
I do want her to be on a statin but per my last notes she wasn't interested.  I don't know anything about this fax or who ordered it.

## 2019-05-11 NOTE — Telephone Encounter (Signed)
Addendum: A referral was placed for longer-term therapeutic services at [facility] on 01/25/2019 by this provider; however, [facility] reportedly declined to schedule when contacted. Following a phone conversation with this provider on 05/03/2019, a referral was placed again with Georgana's verbal consent. Per Epic, Dvora was contacted by [facility], but an appointment has not been scheduled at this time. As such, this provider requested April Moore, [place] with [facility] call to follow-up with [name]. Ms. Laurance Flatten left Elley a voicemail.

## 2019-05-14 DIAGNOSIS — J449 Chronic obstructive pulmonary disease, unspecified: Secondary | ICD-10-CM | POA: Diagnosis not present

## 2019-05-14 DIAGNOSIS — G4733 Obstructive sleep apnea (adult) (pediatric): Secondary | ICD-10-CM | POA: Diagnosis not present

## 2019-05-14 DIAGNOSIS — J45909 Unspecified asthma, uncomplicated: Secondary | ICD-10-CM | POA: Diagnosis not present

## 2019-05-14 DIAGNOSIS — R269 Unspecified abnormalities of gait and mobility: Secondary | ICD-10-CM | POA: Diagnosis not present

## 2019-05-15 ENCOUNTER — Telehealth: Payer: Self-pay

## 2019-05-15 NOTE — Telephone Encounter (Signed)
Rtc, lm for rtc 

## 2019-05-15 NOTE — Telephone Encounter (Signed)
Pt would like a letter for school.  Please call pt back.

## 2019-05-19 ENCOUNTER — Telehealth: Payer: Self-pay | Admitting: Internal Medicine

## 2019-05-19 NOTE — Telephone Encounter (Signed)
Patient requesting a call back about her sinuses.  Pt also called on 05/15/2019 and has not heard back about needing a letter written.

## 2019-05-19 NOTE — Telephone Encounter (Signed)
Called pt back - no answer; left message to give [place] a call back on Monday.

## 2019-05-22 NOTE — Telephone Encounter (Signed)
Called pt - stated she's having sinus problems; pain behind her eyes this am. Also stated she needs a letter to return to school; stating she's under medical,ok to return. [facility] appt scheduled for Wed 9/30 .

## 2019-05-22 NOTE — Telephone Encounter (Signed)
Agree with plan. Thank you

## 2019-05-24 ENCOUNTER — Ambulatory Visit: Payer: Medicare Other

## 2019-05-26 ENCOUNTER — Ambulatory Visit (HOSPITAL_COMMUNITY)
Admission: RE | Admit: 2019-05-26 | Discharge: 2019-05-26 | Disposition: A | Discharge status: Home / Self Care | Payer: Medicare Other | Source: Ambulatory Visit | Attending: Family Medicine | Admitting: Family Medicine

## 2019-05-26 ENCOUNTER — Ambulatory Visit (HOSPITAL_COMMUNITY)
Admission: RE | Admit: 2019-05-26 | Discharge: 2019-05-26 | Disposition: A | Discharge status: Home / Self Care | Payer: Medicare Other | Source: Ambulatory Visit | Attending: Student in an Organized Health Care Education/Training Program | Admitting: Student in an Organized Health Care Education/Training Program

## 2019-05-26 ENCOUNTER — Ambulatory Visit (INDEPENDENT_AMBULATORY_CARE_PROVIDER_SITE_OTHER): Payer: Medicare Other | Admitting: Internal Medicine

## 2019-05-26 ENCOUNTER — Other Ambulatory Visit: Payer: Self-pay

## 2019-05-26 ENCOUNTER — Encounter: Payer: Self-pay | Admitting: Internal Medicine

## 2019-05-26 VITALS — BP 179/92 | HR 83 | Temp 98.5°F | Wt 234.4 lb

## 2019-05-26 DIAGNOSIS — R0600 Dyspnea, unspecified: Secondary | ICD-10-CM

## 2019-05-26 DIAGNOSIS — I5032 Chronic diastolic (congestive) heart failure: Secondary | ICD-10-CM

## 2019-05-26 DIAGNOSIS — I5030 Unspecified diastolic (congestive) heart failure: Secondary | ICD-10-CM | POA: Diagnosis not present

## 2019-05-26 DIAGNOSIS — I110 Hypertensive heart disease with heart failure: Secondary | ICD-10-CM

## 2019-05-26 DIAGNOSIS — I10 Essential (primary) hypertension: Secondary | ICD-10-CM

## 2019-05-26 DIAGNOSIS — Z79899 Other long term (current) drug therapy: Secondary | ICD-10-CM | POA: Diagnosis not present

## 2019-05-26 DIAGNOSIS — R0602 Shortness of breath: Secondary | ICD-10-CM | POA: Diagnosis not present

## 2019-05-26 DIAGNOSIS — R0609 Other forms of dyspnea: Secondary | ICD-10-CM

## 2019-05-26 DIAGNOSIS — Z9114 Patient's other noncompliance with medication regimen: Secondary | ICD-10-CM | POA: Diagnosis not present

## 2019-05-26 LAB — CBC
HCT: 40.3 % (ref 36.0–46.0)
Hemoglobin: 12.8 g/dL (ref 12.0–15.0)
MCH: 30.8 pg (ref 26.0–34.0)
MCHC: 31.8 g/dL (ref 30.0–36.0)
MCV: 97.1 fL (ref 80.0–100.0)
Platelets: 187 10*3/uL (ref 150–400)
RBC: 4.15 MIL/uL (ref 3.87–5.11)
RDW: 12.8 % (ref 11.5–15.5)
WBC: 5.2 10*3/uL (ref 4.0–10.5)
nRBC: 0 % (ref 0.0–0.2)

## 2019-05-26 LAB — BASIC METABOLIC PANEL
Anion gap: 11 (ref 5–15)
BUN: 19 mg/dL (ref 8–23)
CO2: 29 mmol/L (ref 22–32)
Calcium: 9.9 mg/dL (ref 8.9–10.3)
Chloride: 103 mmol/L (ref 98–111)
Creatinine, Ser: 1.35 mg/dL — ABNORMAL HIGH (ref 0.44–1.00)
GFR calc Af Amer: 48 mL/min — ABNORMAL LOW (ref >60)
GFR calc non Af Amer: 41 mL/min — ABNORMAL LOW (ref >60)
Glucose, Bld: 107 mg/dL — ABNORMAL HIGH (ref 70–99)
Potassium: 4 mmol/L (ref 3.5–5.1)
Sodium: 143 mmol/L (ref 135–145)

## 2019-05-26 LAB — TSH: TSH: 0.673 u[IU]/mL (ref 0.350–4.500)

## 2019-05-26 LAB — TROPONIN I (HIGH SENSITIVITY): Troponin I (High Sensitivity): 11 ng/L (ref <18)

## 2019-05-26 LAB — BRAIN NATRIURETIC PEPTIDE: B Natriuretic Peptide: 58.4 pg/mL (ref 0.0–100.0)

## 2019-05-26 MED ORDER — ALBUTEROL SULFATE (2.5 MG/3ML) 0.083% IN NEBU
2.5000 mg | INHALATION_SOLUTION | Freq: Once | RESPIRATORY_TRACT | Status: AC
  Administered 2019-05-26: 2.5 mg via RESPIRATORY_TRACT

## 2019-05-26 MED ORDER — IPRATROPIUM BROMIDE 0.02 % IN SOLN
0.5000 mg | Freq: Once | RESPIRATORY_TRACT | Status: AC
  Administered 2019-05-26: 12:00:00 0.5 mg via RESPIRATORY_TRACT

## 2019-05-26 MED ORDER — IPRATROPIUM-ALBUTEROL 0.5-2.5 (3) MG/3ML IN SOLN: 3.0000 mL | Freq: Once | RESPIRATORY_TRACT | Status: DC

## 2019-05-26 NOTE — Patient Instructions (Signed)
[name],  Thank you for allowing me to take part in your care today.  Today we talked about your recent weight gain, shortness of breath, chest pain, and edema in your legs. Your blood pressure is also elevated, you have applied your Clonidine patch in clinic.  We will send you for blood work, chest xray , and EKG to better understand if this symptoms are coming from your heart failure.

## 2019-05-26 NOTE — Assessment & Plan Note (Signed)
Patient BP 181/101, place clonidine patch on . Has not had patch on for 1-2 days.  Recheck 179/82. Patient worked up for heart failure exacerbation during this visit. - Counsel patient on BP control

## 2019-05-26 NOTE — Progress Notes (Signed)
   CC: shortness of breath and left shoulder pain  HPI:[name] is a 63 y.o. with PMHx below. She comes in with SOB and left shoulder pain concerning for heart failure exacerbation. Please see problem based charting for details of assessment and plan.   Past Medical History:  Diagnosis Date  . Anemia   . Anxiety    HX PANIC ATTACKS  . Arthritis    "starting to; in my hands" (07/09/2015)  . Asthma   . Atrial fibrillation ([place])   . Atrial flutter, paroxysmal ([place])   . Bloated abdomen   . CFS (chronic fatigue syndrome)   . Chewing difficulty   . Chronic asthma with acute exacerbation    "I have chronic asthma all the time; sometimes exacerbations" (07/09/2015)  . Chronic lower back pain   . COPD (chronic obstructive pulmonary disease) ([place])   . Cyst of right kidney    "3 of them; dx'd in ~ 01/2015"  . Dyspnea   . GERD (gastroesophageal reflux disease)   . Heart murmur   . History of blood transfusion    "related to my brain surgery I think"  . History of pulmonary embolism 07/09/2015  . HIV antibody positive ([place])   . HIV disease ([place])   . Hyperlipidemia   . Hypertension   . Leg edema   . Lipodystrophy   . Mild CAD 2013  . Palpitations   . Pneumonia 07/09/2015  . Shingles   . Sleep apnea    "never completed part 2 of study; never wore mask" (07/09/2015)  . Vitamin B 12 deficiency   . Vitamin D deficiency    Review of Systems:  Review of Systems  Constitutional: Negative for fever.       Weigh gain  Respiratory: Positive for shortness of breath. Negative for cough.   Cardiovascular: Positive for chest pain and leg swelling.      Vitals:   05/26/19 0938 05/26/19 1011  BP: (!) 181/101 (!) 179/92  Pulse: 86 83  Temp: 98.5 F (36.9 C)   TempSrc: Oral   SpO2: 94%   Weight: 234 lb 6.4 oz (106.3 kg)    Physical Exam: Physical Exam Constitutional:      Appearance: She is obese.     Comments: Labored breathing while talking  Cardiovascular:     Rate  and Rhythm: Normal rate and regular rhythm.     Heart sounds: No murmur. No friction rub. No gallop.   Pulmonary:     Breath sounds: No wheezing, rhonchi or rales.     Comments: Decreased breath sounds in lower air fields.  Musculoskeletal:     Right lower leg: Edema present.     Left lower leg: Edema present.  Neurological:     Mental Status: She is alert.     Assessment & Plan:   See Encounters Tab for problem based charting.  Patient seen with Dr. Rebeca Alert

## 2019-05-26 NOTE — Assessment & Plan Note (Addendum)
Patient comes in with shortness of breath and chest pain that radiates down her left shoulder.  Reports increased weight gain of approximately 6 pounds in this week and has lower extremity edema. Symptoms concerning for heart failure exacerbation. Patient reports not taking her lasix recently.  History of HFpEF. Prior Echo on 02/04/2019 showed EF of 65% with impaired relaxation.  Notable on exams, SOB while talking. No appreciated rales. Pain in shoulder on palpation.   Blood pressure is elevated to 859 systolic, patient places clonidine patch on in clinic but has had it on for a day. .   Plan: - Troponin - BNP:  - BMP -TSH:  -Chest xray, 2 view: pulmonary congestion, but no focal opacities .  -EKG: Sinus rhythm, normal intervals. V4 and V5 show decrease R wave progression, but otherwise not acute changes. No ST elevation or T wave abnormalities appreciated - Lasix 20 mg , weight check. - Follow up in 1 week

## 2019-06-05 NOTE — Progress Notes (Signed)
Internal Medicine Clinic Attending  I saw and evaluated the patient.  I personally confirmed the key portions of the history and exam documented by Dr. Court Joy and I reviewed pertinent patient test results.  The assessment, diagnosis, and plan were formulated together and I agree with the documentation in the resident's note.  Having chest pain and dyspnea, noted increased leg swelling. Suspect HFpEF exacerbation, even with normal BNP. No concern for ACS with negative troponin and reassuring EKG. Plan to resume diuretics and have her come back for recheck.   Lenice Pressman, M.D., Ph.D.

## 2019-06-08 ENCOUNTER — Ambulatory Visit: Payer: Medicare Other

## 2019-06-12 ENCOUNTER — Encounter: Payer: Self-pay | Admitting: Internal Medicine

## 2019-06-12 ENCOUNTER — Ambulatory Visit (INDEPENDENT_AMBULATORY_CARE_PROVIDER_SITE_OTHER): Payer: Medicare Other | Admitting: Internal Medicine

## 2019-06-12 ENCOUNTER — Other Ambulatory Visit: Payer: Self-pay | Admitting: *Deleted

## 2019-06-12 VITALS — BP 194/113 | HR 92 | Temp 98.1°F | Wt 230.9 lb

## 2019-06-12 DIAGNOSIS — I110 Hypertensive heart disease with heart failure: Secondary | ICD-10-CM | POA: Diagnosis not present

## 2019-06-12 DIAGNOSIS — Z79899 Other long term (current) drug therapy: Secondary | ICD-10-CM | POA: Diagnosis not present

## 2019-06-12 DIAGNOSIS — K5900 Constipation, unspecified: Secondary | ICD-10-CM

## 2019-06-12 DIAGNOSIS — I10 Essential (primary) hypertension: Secondary | ICD-10-CM

## 2019-06-12 DIAGNOSIS — I5030 Unspecified diastolic (congestive) heart failure: Secondary | ICD-10-CM

## 2019-06-12 DIAGNOSIS — I5032 Chronic diastolic (congestive) heart failure: Secondary | ICD-10-CM

## 2019-06-12 DIAGNOSIS — K5901 Slow transit constipation: Secondary | ICD-10-CM

## 2019-06-12 NOTE — Assessment & Plan Note (Signed)
Patient seen on 10/2 of breath and chest pain. She had approximately 6 pound weight gain and lower extremity edema. Basic labs were checked and she was started on Lasix. Since that time her shortness of breath and chest pain have improved. Her lower extremity edema is no trace. Her weight is down to baseline of 230. She continues to take all her medications as prescribed.  A/P: - Compensated today and appears euvolemic  - Continue Lasix and spironolactone

## 2019-06-12 NOTE — Patient Instructions (Signed)
Thank you for allowing [place] to provide your care. Today we're gonna focus on getting her constipation under control. You can go to the store and buy an mineral oil enema. Take this tell me have a bowel movement. In addition to this I would like to start taking MiraLAX 17 g daily with Senokot S one tablet twice a day. Please do not hesitate to call if things do not improve.  Constipation, Adult Constipation is when a person:  Poops (has a bowel movement) fewer times in a week than normal.  Has a hard time pooping.  Has poop that is dry, hard, or bigger than normal. Follow these instructions at home: Eating and drinking   Eat foods that have a lot of fiber, such as: ? Fresh fruits and vegetables. ? Whole grains. ? Beans.  Eat less of foods that are high in fat, low in fiber, or overly processed, such as: ? [place] fries. ? Hamburgers. ? Cookies. ? Candy. ? Soda.  Drink enough fluid to keep your pee (urine) clear or pale yellow. General instructions  Exercise regularly or as told by your doctor.  Go to the restroom when you feel like you need to poop. Do not hold it in.  Take over-the-counter and prescription medicines only as told by your doctor. These include any fiber supplements.  Do pelvic floor retraining exercises, such as: ? Doing deep breathing while relaxing your lower belly (abdomen). ? Relaxing your pelvic floor while pooping.  Watch your condition for any changes.  Keep all follow-up visits as told by your doctor. This is important. Contact a doctor if:  You have pain that gets worse.  You have a fever.  You have not pooped for 4 days.  You throw up (vomit).  You are not hungry.  You lose weight.  You are bleeding from the anus.  You have thin, pencil-like poop (stool). Get help right away if:  You have a fever, and your symptoms suddenly get worse.  You leak poop or have blood in your poop.  Your belly feels hard or bigger than normal (is  bloated).  You have very bad belly pain.  You feel dizzy or you faint. This information is not intended to replace advice given to you by your health care provider. Make sure you discuss any questions you have with your health care provider. Document Released: 01/27/2008 Document Revised: 07/23/2017 Document Reviewed: 01/29/2016 Elsevier Patient Education  2020 [employer].

## 2019-06-12 NOTE — Assessment & Plan Note (Signed)
Patient voices concerns of constipation today. She states that she is not had a bowel movement approximately one week. She has the urge to defecate but when she tries she only passes small firm pellets. She is not tried anything for her constipation aside from some over-the-counter laxatives. She states that she is been trying to increase her fiber intake by eating more fruits. She denies any nausea/vomiting but does feel that things are just not moving through her bowels. She thinks that her constipation is the cause of all her symptoms today. She does have some vague abdominal discomfort.  A/P: - Advised mineral oil enema. Advised to avoid phosphate containing and was given her CKD - Will start MiraLAX 17 g daily and Senokot-S one tablet twice daily. - Patient will call the clinic if things do not improve.

## 2019-06-12 NOTE — Assessment & Plan Note (Signed)
Patient with uncontrolled hypertension. She is currently on a clonidine patch 0.2 mg every seven days, and spironolactone 25 mg daily. She is tolerating both these medications well without any apparent side effects. She does not check her blood pressure at home. She denies orthostatic symptoms.  A/P: - Reluctant to further medications. Will treat constipation and see if blood pressure improved. - Continue clonidine patch 0.2 mg every seven days, and spironolactone 25 mg daily.

## 2019-06-12 NOTE — Progress Notes (Signed)
   CC: Constipation, HTN, HFpEF  HPI:  Shannon [name] is a 63 y.o. female with PMHx listed below presenting for Constipation, HTN, HFpEF. Please see the A&P for the status of the patient's chronic medical problems.  Past Medical History:  Diagnosis Date  . Anemia   . Anxiety    HX PANIC ATTACKS  . Arthritis    "starting to; in my hands" (07/09/2015)  . Asthma   . Atrial fibrillation ([place])   . Atrial flutter, paroxysmal ([place])   . Bloated abdomen   . CFS (chronic fatigue syndrome)   . Chewing difficulty   . Chronic asthma with acute exacerbation    "I have chronic asthma all the time; sometimes exacerbations" (07/09/2015)  . Chronic lower back pain   . COPD (chronic obstructive pulmonary disease) ([place])   . Cyst of right kidney    "3 of them; dx'd in ~ 01/2015"  . Dyspnea   . GERD (gastroesophageal reflux disease)   . Heart murmur   . History of blood transfusion    "related to my brain surgery I think"  . History of pulmonary embolism 07/09/2015  . HIV antibody positive ([place])   . HIV disease ([place])   . Hyperlipidemia   . Hypertension   . Leg edema   . Lipodystrophy   . Mild CAD 2013  . Palpitations   . Pneumonia 07/09/2015  . Shingles   . Sleep apnea    "never completed part 2 of study; never wore mask" (07/09/2015)  . Vitamin B 12 deficiency   . Vitamin D deficiency    Review of Systems:  Performed and all others negative.  Physical Exam: Vitals:   06/12/19 0933 06/12/19 0956  BP: (!) 188/133 (!) 194/113  Pulse: 98 92  Temp: 98.1 F (36.7 C)   TempSrc: Oral   SpO2: 94%   Weight: 230 lb 14.4 oz (104.7 kg)    General: Obese female in no acute distress Pulm: Good air movement with no wheezing or crackles  CV: RRR, no murmurs, no rubs  Abdomen: Active bowel sounds, soft, non-distended, no tenderness to palpation  Extremities: Trace LE edema   Assessment & Plan:   See Encounters Tab for problem based charting.  Patient discussed with Dr. Heber [place]

## 2019-06-13 DIAGNOSIS — G4733 Obstructive sleep apnea (adult) (pediatric): Secondary | ICD-10-CM | POA: Diagnosis not present

## 2019-06-13 DIAGNOSIS — J449 Chronic obstructive pulmonary disease, unspecified: Secondary | ICD-10-CM | POA: Diagnosis not present

## 2019-06-13 DIAGNOSIS — R269 Unspecified abnormalities of gait and mobility: Secondary | ICD-10-CM | POA: Diagnosis not present

## 2019-06-13 DIAGNOSIS — J45909 Unspecified asthma, uncomplicated: Secondary | ICD-10-CM | POA: Diagnosis not present

## 2019-06-14 NOTE — Progress Notes (Signed)
Internal Medicine Clinic Attending  Case discussed with Dr. Helberg at the time of the visit.  We reviewed the resident's history and exam and pertinent patient test results.  I agree with the assessment, diagnosis, and plan of care documented in the resident's note.    

## 2019-06-15 MED ORDER — AZELASTINE HCL 0.05 % OP SOLN: 2.0000 [drp] | Freq: Two times a day (BID) | OPHTHALMIC | 3 refills | Status: DC | PRN

## 2019-06-15 MED ORDER — RIVAROXABAN 20 MG PO TABS: 20.0000 mg | ORAL_TABLET | Freq: Every day | ORAL | 1 refills | Status: DC

## 2019-06-15 MED ORDER — PROAIR HFA 108 (90 BASE) MCG/ACT IN AERS: INHALATION_SPRAY | RESPIRATORY_TRACT | 6 refills | Status: DC

## 2019-06-18 ENCOUNTER — Other Ambulatory Visit: Payer: Self-pay | Admitting: Internal Medicine

## 2019-06-18 DIAGNOSIS — E538 Deficiency of other specified B group vitamins: Secondary | ICD-10-CM

## 2019-06-30 ENCOUNTER — Ambulatory Visit (INDEPENDENT_AMBULATORY_CARE_PROVIDER_SITE_OTHER): Payer: Medicare Other | Admitting: Internal Medicine

## 2019-06-30 ENCOUNTER — Other Ambulatory Visit: Payer: Self-pay

## 2019-06-30 DIAGNOSIS — Z79899 Other long term (current) drug therapy: Secondary | ICD-10-CM | POA: Diagnosis not present

## 2019-06-30 DIAGNOSIS — I10 Essential (primary) hypertension: Secondary | ICD-10-CM

## 2019-06-30 NOTE — Assessment & Plan Note (Signed)
BP has been high.  I think she is only intermittently taking her BP medication.  She notes that she did not take her lasix or her patch the last 2 days due to being busy.  She was not able to come in today due to exhaustion from working long hours.   Have advised her to take her BP medication including lasix, place her clonidine patch on and we will check her BP in 1 month in an in person appointment.  She expressed understanding.

## 2019-06-30 NOTE — Progress Notes (Signed)
[facility] Telephone Encounter Continuity Care Appointment  HPI:  This telephone encounter was created for [name] on 06/30/2019 for the following purpose/cc follow up of HTN.  Working [employer], took a lot our of her, long day.  She had muscle aches after this and she was exhausted.  Worked 15 hours straight and they were short staffed.    Vital signs: 91% pOx, pulse 85, Temp 97.5, BP 169/99.    BP has been running high.  Even when she is wearing the clonidine patch.  She has been so busy in the last 2 days that she has been taking medication off schedule.   Planning on being back in school in January.  She feels that she should see a gastroenterologist due to the extra weight she is carrying.  She is having increased swelling due to not taking furosemide.  She has started that back today.  She has seen [name] in the past, and she is going to set up an appointment with him.    She feels that she is not breathing well, retaining fluids and have decreased activity due to the pandemic.   Needs a letter for being out of school the semester.  15 minutes.    Past Medical History:  Past Medical History:  Diagnosis Date  . Anemia   . Anxiety    HX PANIC ATTACKS  . Arthritis    "starting to; in my hands" (07/09/2015)  . Asthma   . Atrial fibrillation ([place])   . Atrial flutter, paroxysmal ([place])   . Bloated abdomen   . CFS (chronic fatigue syndrome)   . Chewing difficulty   . Chronic asthma with acute exacerbation    "I have chronic asthma all the time; sometimes exacerbations" (07/09/2015)  . Chronic lower back pain   . COPD (chronic obstructive pulmonary disease) ([place])   . Cyst of right kidney    "3 of them; dx'd in ~ 01/2015"  . Dyspnea   . GERD (gastroesophageal reflux disease)   . Heart murmur   . History of blood transfusion    "related to my brain surgery I think"  . History of pulmonary embolism 07/09/2015  . HIV antibody positive  ([place])   . HIV disease ([place])   . Hyperlipidemia   . Hypertension   . Leg edema   . Lipodystrophy   . Mild CAD 2013  . Palpitations   . Pneumonia 07/09/2015  . Shingles   . Sleep apnea    "never completed part 2 of study; never wore mask" (07/09/2015)  . Vitamin B 12 deficiency   . Vitamin D deficiency       ROS:      Assessment / Plan / Recommendations:   Please see A&P under problem oriented charting for assessment of the patient's acute and chronic medical conditions.   As always, pt is advised that if symptoms worsen or new symptoms arise, they should go to an urgent care facility or to to ER for further evaluation.   Consent and Medical Decision Making:   This is a telephone encounter between KEONDRA HAYDU and Gilles Chiquito on 06/30/2019 for follow up of HTN. The visit was conducted with the patient located at home and Gilles Chiquito at [facility]. The patient's identity was confirmed using their DOB and current address. The patient has consented to being evaluated through a telephone encounter and understands the associated risks (an examination cannot be done and the patient  may need to come in for an appointment) / benefits (allows the patient to remain at home, decreasing exposure to coronavirus). I personally spent 15 minutes on medical discussion.    Return in 1 month, sooner if needed.

## 2019-06-30 NOTE — Patient Instructions (Signed)
Instructions given over the phone.

## 2019-07-02 ENCOUNTER — Other Ambulatory Visit: Payer: Self-pay | Admitting: Internal Medicine

## 2019-07-14 DIAGNOSIS — R269 Unspecified abnormalities of gait and mobility: Secondary | ICD-10-CM | POA: Diagnosis not present

## 2019-07-14 DIAGNOSIS — J449 Chronic obstructive pulmonary disease, unspecified: Secondary | ICD-10-CM | POA: Diagnosis not present

## 2019-07-14 DIAGNOSIS — G4733 Obstructive sleep apnea (adult) (pediatric): Secondary | ICD-10-CM | POA: Diagnosis not present

## 2019-07-14 DIAGNOSIS — J45909 Unspecified asthma, uncomplicated: Secondary | ICD-10-CM | POA: Diagnosis not present

## 2019-08-02 ENCOUNTER — Telehealth: Payer: Self-pay | Admitting: Cardiovascular Disease

## 2019-08-02 NOTE — Telephone Encounter (Signed)
Spoke with patient of Dr. Oval Linsey who reports chest tightness, SOB when walking, heart racing. Offered NP visit tomorrow but she cannot make this. The earliest in-office opening with APP with 12/18 but patient has an appointment. Patient reports BP is 160/90, HR 90. She is willing to see APP, as MD does not have openings until Jan/Feb  Patient is taking ASA 81mg  2-3 tablets as needed for the chest tightness  Routed to MD to review & advise

## 2019-08-02 NOTE — Telephone Encounter (Signed)
Pt c/o of Chest Pain: STAT if CP now or developed within 24 hours  1. Are you having CP right now? no  2. Are you experiencing any other symptoms (ex. SOB, nausea, vomiting, sweating)? SOB when walking up stairs, Pt also experiences SOB when the chest tightness is present and heart races  3. How long have you been experiencing CP? 2 weeks or so  4. Is your CP continuous or coming and going? Comes and goes  5. Have you taken Nitroglycerin? No  Patient is concerned about the chest tightness she has been having. She knows the difference between indigestion and chest pain, and this feeling is not indigestion. She would like to get in to see Dr. Oval Linsey but does not want to wait until February for an appointment. Patient refused appt with APP

## 2019-08-04 NOTE — Telephone Encounter (Signed)
Left detailed message about appointment 12/15 with Dr Oval Linsey  Ask that if she could not make to call back Monday and reschedule

## 2019-08-08 ENCOUNTER — Telehealth: Payer: Self-pay | Admitting: Cardiovascular Disease

## 2019-08-08 ENCOUNTER — Ambulatory Visit: Payer: Medicare Other | Admitting: Cardiovascular Disease

## 2019-08-08 NOTE — Telephone Encounter (Signed)
Per patient request she was rescheduled for another in office day

## 2019-08-08 NOTE — Telephone Encounter (Signed)
Left message to call back  OK to change per Dr Oval Linsey

## 2019-08-08 NOTE — Telephone Encounter (Signed)
Patient states she is not feeling well and would like to change her appt to a virtual visit for today 12/15 at 2pm.  She does not want to come out today as she is not feeling well.

## 2019-08-11 ENCOUNTER — Encounter: Payer: Medicare Other | Admitting: Internal Medicine

## 2019-08-11 ENCOUNTER — Telehealth: Payer: Self-pay | Admitting: Internal Medicine

## 2019-08-13 ENCOUNTER — Other Ambulatory Visit: Payer: Self-pay | Admitting: Internal Medicine

## 2019-08-13 DIAGNOSIS — J449 Chronic obstructive pulmonary disease, unspecified: Secondary | ICD-10-CM | POA: Diagnosis not present

## 2019-08-13 DIAGNOSIS — G4733 Obstructive sleep apnea (adult) (pediatric): Secondary | ICD-10-CM | POA: Diagnosis not present

## 2019-08-13 DIAGNOSIS — R269 Unspecified abnormalities of gait and mobility: Secondary | ICD-10-CM | POA: Diagnosis not present

## 2019-08-13 DIAGNOSIS — J45909 Unspecified asthma, uncomplicated: Secondary | ICD-10-CM | POA: Diagnosis not present

## 2019-08-16 ENCOUNTER — Other Ambulatory Visit: Payer: Self-pay

## 2019-08-16 ENCOUNTER — Other Ambulatory Visit: Payer: Self-pay | Admitting: Internal Medicine

## 2019-08-16 MED ORDER — OMEPRAZOLE 40 MG PO CPDR: 40.0000 mg | DELAYED_RELEASE_CAPSULE | Freq: Every day | ORAL | 0 refills | Status: DC

## 2019-08-16 NOTE — Telephone Encounter (Signed)
Omeprazole refilled as pharmacy requested.  

## 2019-08-23 ENCOUNTER — Other Ambulatory Visit: Payer: Self-pay | Admitting: Internal Medicine

## 2019-08-23 ENCOUNTER — Encounter: Payer: Self-pay | Admitting: Cardiovascular Disease

## 2019-08-23 ENCOUNTER — Ambulatory Visit (INDEPENDENT_AMBULATORY_CARE_PROVIDER_SITE_OTHER): Payer: Medicare Other | Admitting: Cardiovascular Disease

## 2019-08-23 ENCOUNTER — Other Ambulatory Visit: Payer: Self-pay

## 2019-08-23 VITALS — BP 193/122 | HR 111 | Ht 63.5 in | Wt 233.2 lb

## 2019-08-23 DIAGNOSIS — Z9119 Patient's noncompliance with other medical treatment and regimen: Secondary | ICD-10-CM

## 2019-08-23 DIAGNOSIS — I4892 Unspecified atrial flutter: Secondary | ICD-10-CM | POA: Diagnosis not present

## 2019-08-23 DIAGNOSIS — I10 Essential (primary) hypertension: Secondary | ICD-10-CM

## 2019-08-23 DIAGNOSIS — I5032 Chronic diastolic (congestive) heart failure: Secondary | ICD-10-CM | POA: Diagnosis not present

## 2019-08-23 DIAGNOSIS — I739 Peripheral vascular disease, unspecified: Secondary | ICD-10-CM

## 2019-08-23 DIAGNOSIS — Z91199 Patient's noncompliance with other medical treatment and regimen due to unspecified reason: Secondary | ICD-10-CM

## 2019-08-23 NOTE — Patient Instructions (Signed)
Medication Instructions:  NO CHANGES *If you need a refill on your cardiac medications before your next appointment, please call your pharmacy*  Lab Work: NONE NEEDED   Testing/Procedures: NONE NEEDED  Follow-Up: At [facility], you and your health needs are our priority.  As part of our continuing mission to provide you with exceptional heart care, we have created designated Provider Care Teams.  These Care Teams include your primary Cardiologist (physician) and Advanced Practice Providers (APPs -  Physician Assistants and Nurse Practitioners) who all work together to provide you with the care you need, when you need it.  Your next appointment:   2-4 week(s)  The format for your next appointment:   Virtual Visit   Provider:   Skeet Latch, MD  Other Instructions CHECK YOUR BLOOD PRESSURE TWICE A DAY, KEEP A RECORD OF THE READINGS.

## 2019-08-23 NOTE — Progress Notes (Signed)
Cardiology Office Note   Date:  08/23/2019   ID:  March, Joos 1956-08-10, MRN [id number]  PCP:  [name], MD  Cardiologist:   Skeet Latch, MD  ID: Shannon Basques, MD  No chief complaint on file.    History of Present Illness: Shannon [name] is a 63 y.o. female with hypertension, paroxysmal atrial flutter, asthma, medication non-compliance, non-obstructive CAD, OSA, prior PE, moderate pulmonary hypertension, mild ascending aorta aneurysm, PAD, and HIV (well-controlled) who presents via video conferencing for a telehealth visit today.  Shannon [name] was initially referred by Shannon Basques, MD, on 12/26/15. Shannon [name] was concerned about her lower extremity edema and shortness of breath. She was started on lasix 20 mg every other day. Shannon [name] noted shortness of breath after a hospitalization for pneumonia 06/2015. She denied lower extremity edema or orthopnea but did report chest pain when laying down at night. She was referred for an echo 01/2016 that revealed LVEF 60-65% awith grade 1 diastolic dysfunction and hypokinesis of the basal inferior wall. She also had very mild aortic stenosis with a mean gradient of 9 mmHg and trivial AR. PASP was 52 mmHg. She had a Lexiscan Myoviewat that time that was negative for ischemia. Shannon [name] previously had a Lexiscan Cardiolite in September 2013 that revealed a medium, mild reversible defect in the anterior wall and normal systolic function. She subsequently underwent cardiac catheterization that revealed nonobstructive coronary disease. She had mild pulmonary hypertension and a normal cardiac output. Left ventriculography revealed mild to moderate aortic regurgitation.  She had a 24-hour Holter in February 2015 that revealed rare PACs and PVCs. Echo 01/06/17 revealed LVEF 60-65% with mild LVH and a mean aortic valve gradient of 8 mmHg. Tricuspid regurgitation was not significant enough to assess pulmonary pressures.  Repeat echo  01/2019 revealed LVEF >65%, severe LVH, intracavitary gradient 61 mmHg, mild AS (mean gradient 13 mmHg), mild-moderate AR, and mild ascending aorta aneurysm (4.1 cm).  Shannon [name] was admitted to the hospital 01/25/17 with hypertensive emergency and chest pain. Her blood pressure was 215/100.  She consistently struggles with mediation adherence and has strong feelings about what medicines she should take and when she should take them. Shannon [name] [facility] and has received a lot of help there but hasn't been going lately 2/2 COVID-19.  Lisinopril was stopped due to cough and throat swelling.  Shannon [name] reported claudication and had ABIs performed by her PCP, Dr. Daryll [name]. The full results are not available, but it was reportedly abnormal.  ABI/Dopplers revealed moderate R LE arterial disease and mild L LE disease.  Shannon [name] presents today after missing/cancelling several appointments with me.  She thinks her BP is elevated because she hasn't moved her bowels in several days.  When she feels this way she doesn't take her medication because she thinks she needs to get cleansed out.  She also isn't taking her medication at any particular time because her sleep cycles has been off.  She goes to bed at 3-4 am and wakes up at noon.  She recently worked at [employer] and enjoyed it but thinks she overexerted herself.  She has otherwise been well and denies chest pain or edema.  Her breathing has been stable lately.   Past Medical History:  Diagnosis Date  . Anemia   . Anxiety    HX PANIC ATTACKS  . Arthritis    "starting to; in my  hands" (07/09/2015)  . Asthma   . Atrial fibrillation ([place])   . Atrial flutter, paroxysmal ([place])   . Bloated abdomen   . CFS (chronic fatigue syndrome)   . Chewing difficulty   . Chronic asthma with acute exacerbation    "I have chronic asthma all the time; sometimes exacerbations" (07/09/2015)  . Chronic lower back pain   . COPD  (chronic obstructive pulmonary disease) ([place])   . Cyst of right kidney    "3 of them; dx'd in ~ 01/2015"  . Dyspnea   . GERD (gastroesophageal reflux disease)   . Heart murmur   . History of blood transfusion    "related to my brain surgery I think"  . History of pulmonary embolism 07/09/2015  . HIV antibody positive ([place])   . HIV disease ([place])   . Hyperlipidemia   . Hypertension   . Leg edema   . Lipodystrophy   . Mild CAD 2013  . Palpitations   . Pneumonia 07/09/2015  . Shingles   . Sleep apnea    "never completed part 2 of study; never wore mask" (07/09/2015)  . Vitamin B 12 deficiency   . Vitamin D deficiency     Past Surgical History:  Procedure Laterality Date  . ABDOMINAL HYSTERECTOMY     "robotic laparosopic"  . BRAIN SURGERY  1974   "brain tumor; benign; on top of my brain; got a plate in there"  . CARDIAC CATHETERIZATION    . TONSILLECTOMY AND ADENOIDECTOMY       Current Outpatient Medications  Medication Sig Dispense Refill  . acetaminophen (TYLENOL) 325 MG tablet Take 325 mg by mouth every 6 (six) hours as needed (FOR PAIN).    [name] albuterol (PROVENTIL) (2.5 MG/3ML) 0.083% nebulizer solution USE 1 VIAL VIA NEBULIZER EVERY 6 HOURS AS NEEDED FOR WHEEZING OR SHORTNESS OF BREATH 75 mL 6  . amoxicillin (AMOXIL) 500 MG capsule TAKE 1 CAPSULE(500 MG) BY MOUTH THREE TIMES DAILY FOR 10 DAYS 30 capsule 0  . Ascorbic Acid (VITAMIN C) 1000 MG tablet Take 1,000 mg by mouth 2 (two) times a week.     [name] azelastine (OPTIVAR) 0.05 % ophthalmic solution Place 2 drops into both eyes 2 (two) times daily as needed (for swelling or itching). 6 mL 3  . BIKTARVY 50-200-25 MG TABS tablet TAKE 1 TABLET BY MOUTH DAILY 30 tablet 5  . BIOTIN PO Take 1 tablet by mouth daily as needed (for supplementation).     . Cholecalciferol (SM VITAMIN D3) 100 MCG (4000 UT) CAPS Take 1 capsule (4,000 Units total) by mouth daily. 30 capsule 0  . cloNIDine (CATAPRES - DOSED IN MG/24 HR) 0.2 mg/24hr patch  UNWRAP AND APPLY 1 PATCH TO SKIN ONCE A WEEK 4 patch 3  . cyanocobalamin (,VITAMIN B-12,) 1000 MCG/ML injection ADMINISTER 1 ML(1000 MCG) IN THE MUSCLE EVERY 30 DAYS 1 mL 3  . diclofenac sodium (VOLTAREN) 1 % GEL Apply 2 g topically 3 (three) times daily as needed (arm pain). 50 g 0  . diltiazem (CARDIZEM CD) 240 MG 24 hr capsule TAKE 1 CAPSULE(240 MG) BY MOUTH DAILY 90 capsule 3  . fluticasone (FLONASE) 50 MCG/ACT nasal spray Place 2 sprays into both nostrils daily. (Patient taking differently: Place 2 sprays into both nostrils daily as needed for allergies. ) 16 g 2  . furosemide (LASIX) 40 MG tablet Take 1 tablet (40 mg total) by mouth daily. 90 tablet 3  . MAGNESIUM PO Take by mouth every other day.    [name]  Melatonin 5 MG TABS Take 5 mg by mouth at bedtime as needed (for sleep).    . Misc. Devices (PULSE OXIMETER FOR FINGER) MISC 1 Units by Does not apply route as needed. 1 each 0  . montelukast (SINGULAIR) 10 MG tablet Take 1 tablet (10 mg total) by mouth at bedtime. (Patient taking differently: Take 10 mg by mouth daily as needed (allergies). ) 30 tablet 2  . Mouthwashes (BIOTENE DRY MOUTH GENTLE) LIQD Use as directed 1 Dose in the mouth or throat 2 (two) times daily. 1 Bottle 0  . omeprazole (PRILOSEC) 40 MG capsule Take 1 capsule (40 mg total) by mouth daily. 90 capsule 0  . OXYGEN Inhale 1.5-2 L into the lungs continuous.     . polyethylene glycol (MIRALAX / GLYCOLAX) packet Take 17 g by mouth daily. (Patient taking differently: Take 17 g by mouth daily as needed for mild constipation. ) 14 each 0  . potassium chloride SA (K-DUR,KLOR-CON) 20 MEQ tablet Take 20 mEq by mouth every other day.    [name] PREVIDENT 5000 SENSITIVE 1.1-5 % PSTE Use daily as directed  1  . PROAIR HFA 108 (90 Base) MCG/ACT inhaler INHALE 2 PUFFS INTO THE LUNGS EVERY 4 HOURS AS NEEDED FOR WHEEZING OR SHORTNESS OF BREATH 8.5 g 6  . senna-docusate (SENOKOT-S) 8.6-50 MG tablet Take 1 tablet by mouth at bedtime as needed for mild  constipation. 30 tablet 0  . SYRINGE/NEEDLE, DISP, 1 ML (B-D SYRINGE/NEEDLE 1CC/25GX5/8) 25G X 5/8" 1 ML MISC 1 Units by Does not apply route every 30 (thirty) days. 50 each 0  . XARELTO 20 MG TABS tablet TAKE 1 TABLET(20 MG) BY MOUTH DAILY WITH SUPPER 30 tablet 1   No current facility-administered medications for this visit.    Allergies:   Tree extract, Augmentin [amoxicillin-pot clavulanate], Lisinopril, and Ciprofloxacin    Social History:  The patient  reports that she has never smoked. She has never used smokeless tobacco. She reports current alcohol use. She reports that she does not use drugs.   Family History:  The patient's family history includes Asthma in her mother; Diabetes in her sister; Heart disease in her mother; Heart failure in her mother; Heart murmur in her brother, sister, and sister; Obesity in her mother; Thyroid disease in her mother and sister.    ROS:  Please see the history of present illness.   Otherwise, review of systems are positive for none   All other systems are reviewed and negative.   PHYSICAL EXAM: VS:  BP (!) 193/122   Pulse (!) 111   Ht 5' 3.5" (1.613 m)   Wt 233 lb 3.2 oz (105.8 kg)   SpO2 92%   BMI 40.66 kg/m  , BMI Body mass index is 40.66 kg/m. GENERAL:  Well appearing HEENT: Pupils equal round and reactive, fundi not visualized, oral mucosa unremarkable NECK:  No jugular venous distention, waveform within normal limits, carotid upstroke brisk and symmetric, no bruits LUNGS:  Poor air movement HEART:  RRR.  PMI not displaced or sustained,S1 and S2 within normal limits, no S3, no S4, no clicks, no rubs, no murmurs ABD:  Flat, positive bowel sounds normal in frequency in pitch, no bruits, no rebound, no guarding, no midline pulsatile mass, no hepatomegaly, no splenomegaly EXT:  2 plus pulses throughout, no edema, no cyanosis no clubbing SKIN:  No rashes no nodules NEURO:  Cranial nerves II through XII grossly intact, motor grossly intact  throughout PSYCH:  Cognitively intact, oriented  to person place and time.  Tangential and loquacious   EKG:  EKG is ordered today. The ekg ordered 12/31/15 shows sinus rhythm. Rate 89 bpm. 2 PVCs. 10/28/16: Sinus rhythm. Rate 93 bpm 07/01/17: Sinus tachycardia.  Rate 115 bpm.  LAD.   08/23/19: Sinus tachycardia.  Rate 111 bpm.    Coronary angiography 06/01/12: 10-20% OM, diffuse 20-30% LAD, 10-20% D1, 20% diffuse RCA RA 10, RV 39/12, RV EDP 12, capillary wedge pressure 19, PA 39/21, mean 27, LV 116/14, LVEDP 20, aorta 115/77 Cardiac output (Fick) 6.55, cardiac index 3.2 PVR 1.7 with units, SVR 12 with units. She P/QRS ratio 1 LVEF 80%. Aortic root mildly dilated. 1-2+ aortic regurgitation.  Lexiscan Myoview 01/2016:   The left ventricular ejection fraction is normal (55-65%). The EF is 60% visually. The computer generated EF is not calculated correctly and is therefore not reported.  There was no ST segment deviation noted during stress.  The study is normal. no ischemia . no infarction  24-hour Holter 09/26/13: Sinus rhythm, rare PACs, rare PVCs   Echo 02/04/19: IMPRESSIONS    1. The left ventricle has hyperdynamic systolic function, with an ejection fraction of >65%. The cavity size was normal. There is severely increased left ventricular wall thickness. Left ventricular diastolic Doppler parameters are consistent with  impaired relaxation. Elevated mean left atrial pressure.  2. The severe LVH and very hyperdynamic systolic function creates a dynamic intracavitary gradient, peak 61 mmHg.  3. The right ventricle has normal systolic function. The cavity was normal. There is no increase in right ventricular wall thickness.  4. The pericardial effusion is circumferential.  5. Trivial pericardial effusion is present.  6. The aortic valve is tricuspid. Moderate thickening of the aortic valve. Moderate calcification of the aortic valve. Aortic valve regurgitation is mild to moderate by  color flow Doppler. Mild stenosis of the aortic valve. Moderate aortic annular  calcification noted.  7. The mitral valve is abnormal. Mild thickening of the mitral valve leaflet. Mild calcification of the mitral valve leaflet. There is mild mitral annular calcification present. No evidence of mitral valve stenosis.  8. The aortic root is normal in size and structure.  9. There is mild dilatation of the ascending aorta measuring 41 mm. 10. Pulmonary hypertension is indeterminant, inadequate TR jet. 11. The interatrial septum was not well visualized.   ABI 03/09/19: R ABI 0.77. Tibial vessel disease.  Zero vessel run-off.   L ABI 0.90.  Tibial vessel disease.  One vessel run-off via peroneal artery.   Recent Labs: 02/03/2019: ALT 20 05/26/2019: B Natriuretic Peptide 58.4; BUN 19; Creatinine, Ser 1.35; Hemoglobin 12.8; Platelets 187; Potassium 4.0; Sodium 143; TSH 0.673    Lipid Panel    Component Value Date/Time   CHOL 253 (H) 05/03/2018 1043   TRIG 53 05/03/2018 1043   HDL 86 05/03/2018 1043   CHOLHDL 2.9 05/03/2018 1043   CHOLHDL 3.2 11/26/2016 1618   VLDL 18 11/26/2016 1618   LDLCALC 156 (H) 05/03/2018 1043      Wt Readings from Last 3 Encounters:  08/23/19 233 lb 3.2 oz (105.8 kg)  06/12/19 230 lb 14.4 oz (104.7 kg)  05/26/19 234 lb 6.4 oz (106.3 kg)      ASSESSMENT AND PLAN:  # Hypertension:BP remains poorly controlled because she is not taking her medications.  She refuses to take them when she is constipated.  Much of this visit was spent attempting to convince her the importance of taking it every day as scheduled.  She will continue her currently scheduled medicines and track her BP twice daily.  She has severe LVH 2/2 poorly controlled hypertension.    # Aortic regurgitation # Aortic stenosis: Mild aortic stenosis on echo.  Mean gradient 13 mmHg.  # Hyperlipidemia:Lipids are elevated. She continues to want to work on diet and exercise. Given her malignant  hypertension, will not put it today and focus on getting her BP in control.  However, given that she now has evidence of PAD, this will definitely need to be addressed. LDL goal <70.   # Atrial flutter: Converted with flecainide. Continue diltiazem and Xarelto.   # Ascending aorta aneurysm: 4.0 on CT 12/2016. It was unchanged on MRA 10/2018.  4.1 cm on echo 01/2019.  Will repeat 01/2020.  BP control as above.   # PAD: Lipid control as above.  Refer to Dr. Gwenlyn Found or Fletcher Anon.   Time spent: 35 minutes-Greater than 50% of this time was spent in counseling, explanation of diagnosis, planning of further management, and coordination of care.   Current medicines are reviewed at length with the patient today.  The patient does not have concerns regarding medicines.  The following changes have been made:  None   Labs/ tests ordered today include:   No orders of the defined types were placed in this encounter.    Disposition:   FU with Kiylah Loyer C. Oval Linsey, MD, [facility] in 2-4 weeks virtually.    Signed, Anwar Crill C. Oval Linsey, MD, [facility]  08/23/2019 10:59 AM    [place]

## 2019-08-30 ENCOUNTER — Ambulatory Visit: Payer: Medicare Other | Admitting: Internal Medicine

## 2019-08-30 DIAGNOSIS — Z779 Other contact with and (suspected) exposures hazardous to health: Secondary | ICD-10-CM | POA: Diagnosis not present

## 2019-08-30 DIAGNOSIS — Z1231 Encounter for screening mammogram for malignant neoplasm of breast: Secondary | ICD-10-CM | POA: Diagnosis not present

## 2019-08-31 ENCOUNTER — Encounter: Payer: Self-pay | Admitting: Cardiovascular Disease

## 2019-09-01 ENCOUNTER — Other Ambulatory Visit: Payer: Self-pay

## 2019-09-01 ENCOUNTER — Ambulatory Visit (INDEPENDENT_AMBULATORY_CARE_PROVIDER_SITE_OTHER): Payer: Medicare Other | Admitting: Internal Medicine

## 2019-09-01 DIAGNOSIS — B20 Human immunodeficiency virus [HIV] disease: Secondary | ICD-10-CM | POA: Diagnosis not present

## 2019-09-01 DIAGNOSIS — I10 Essential (primary) hypertension: Secondary | ICD-10-CM

## 2019-09-01 DIAGNOSIS — Z79899 Other long term (current) drug therapy: Secondary | ICD-10-CM

## 2019-09-01 DIAGNOSIS — I5032 Chronic diastolic (congestive) heart failure: Secondary | ICD-10-CM

## 2019-09-01 DIAGNOSIS — N189 Chronic kidney disease, unspecified: Secondary | ICD-10-CM | POA: Diagnosis not present

## 2019-09-01 DIAGNOSIS — I130 Hypertensive heart and chronic kidney disease with heart failure and stage 1 through stage 4 chronic kidney disease, or unspecified chronic kidney disease: Secondary | ICD-10-CM | POA: Diagnosis not present

## 2019-09-01 DIAGNOSIS — I5030 Unspecified diastolic (congestive) heart failure: Secondary | ICD-10-CM | POA: Diagnosis not present

## 2019-09-01 DIAGNOSIS — Z7901 Long term (current) use of anticoagulants: Secondary | ICD-10-CM

## 2019-09-01 NOTE — Assessment & Plan Note (Signed)
She is on chronic xarelto.

## 2019-09-01 NOTE — Assessment & Plan Note (Signed)
Repeat TTE ordered.  Will follow up this study with her when completed.

## 2019-09-01 NOTE — Assessment & Plan Note (Signed)
She will follow up with [name] next week.  I advised her to discuss the COVID vaccine with her.

## 2019-09-01 NOTE — Assessment & Plan Note (Signed)
BP is better controlled in the last 2 days as she has been taking all of her BP medications.  She is excited and convinced that she will start to do better going forward.  She has CKD and we can check her renal function at next visit, or I will follow up on blood work from ID if done next week.   Plan Continue lasix, diltiazem, clonidine

## 2019-09-01 NOTE — Progress Notes (Signed)
[facility] Residency Telephone Encounter Continuity Care Appointment  HPI:   This telephone encounter was created for Shannon [name] on 09/01/2019 for the following purpose/cc Follow up of chronic health issues.  Ms. Juliana reports that she is doing very well.  She has recently been to her cardiologist who strongly encouraged her to take her BP medications as prescribed.  She has only been taking them off and on.  Her BP is controlled when she takes them regularly.  She notes her last 2 blood pressures were 140/80 and 130/78 with taking her medication as prescribed.  She recently went to her OB/Gyn and had her mammogram and pap smear.  He also scheduled her for a colonoscopy and TTE (not sure why).  She is due to see the dentist and [name].  She would like to discuss the COVID vaccine with [name].  She is very motivated to lose weight.  Overall, she noted being in very good spirits.  She had no specific complaints.  She has recently gotten her drivers license and she is excited that she will not have to wait on others to get places.     Past Medical History:  Past Medical History:  Diagnosis Date  . Anemia   . Anxiety    HX PANIC ATTACKS  . Arthritis    "starting to; in my hands" (07/09/2015)  . Asthma   . Atrial fibrillation ([place])   . Atrial flutter, paroxysmal ([place])   . Bloated abdomen   . CFS (chronic fatigue syndrome)   . Chewing difficulty   . Chronic asthma with acute exacerbation    "I have chronic asthma all the time; sometimes exacerbations" (07/09/2015)  . Chronic lower back pain   . COPD (chronic obstructive pulmonary disease) ([place])   . Cyst of right kidney    "3 of them; dx'd in ~ 01/2015"  . Dyspnea   . GERD (gastroesophageal reflux disease)   . Heart murmur   . History of blood transfusion    "related to my brain surgery I think"  . History of pulmonary embolism 07/09/2015  . HIV antibody positive ([place])   . HIV disease ([place])   .  Hyperlipidemia   . Hypertension   . Leg edema   . Lipodystrophy   . Mild CAD 2013  . Palpitations   . Pneumonia 07/09/2015  . Shingles   . Sleep apnea    "never completed part 2 of study; never wore mask" (07/09/2015)  . Vitamin B 12 deficiency   . Vitamin D deficiency       ROS:   As per HPI, no further complaints   Assessment / Plan / Recommendations:   Please see A&P under problem oriented charting for assessment of the patient's acute and chronic medical conditions.   As always, pt is advised that if symptoms worsen or new symptoms arise, they should go to an urgent care facility or to to ER for further evaluation.   Consent and Medical Decision Making:   This is a telephone encounter between NAKIESHA RUMSEY and Gilles Chiquito on 09/01/2019 for follow up of chronic issues. The visit was conducted with the patient located at home and Gilles Chiquito at [facility]. The patient's identity was confirmed using their DOB and current address. The patient has consented to being evaluated through a telephone encounter and understands the associated risks (an examination cannot be done and the patient may need to come in for an appointment) /  benefits (allows the patient to remain at home, decreasing exposure to coronavirus). I personally spent 10 minutes on medical discussion.    Follow up in 2-3 months

## 2019-09-05 ENCOUNTER — Telehealth (INDEPENDENT_AMBULATORY_CARE_PROVIDER_SITE_OTHER): Payer: Medicare Other | Admitting: Cardiovascular Disease

## 2019-09-05 ENCOUNTER — Encounter: Payer: Self-pay | Admitting: Cardiovascular Disease

## 2019-09-05 VITALS — BP 160/93 | HR 95 | Temp 96.7°F | Ht 63.0 in | Wt 230.0 lb

## 2019-09-05 DIAGNOSIS — I483 Typical atrial flutter: Secondary | ICD-10-CM

## 2019-09-05 DIAGNOSIS — I10 Essential (primary) hypertension: Secondary | ICD-10-CM

## 2019-09-05 DIAGNOSIS — E7800 Pure hypercholesterolemia, unspecified: Secondary | ICD-10-CM

## 2019-09-05 DIAGNOSIS — Z5181 Encounter for therapeutic drug level monitoring: Secondary | ICD-10-CM

## 2019-09-05 DIAGNOSIS — I719 Aortic aneurysm of unspecified site, without rupture: Secondary | ICD-10-CM | POA: Diagnosis not present

## 2019-09-05 MED ORDER — SPIRONOLACTONE 25 MG PO TABS: 25.0000 mg | ORAL_TABLET | Freq: Every day | ORAL | 1 refills | Status: DC

## 2019-09-05 NOTE — Patient Instructions (Addendum)
Medication Instructions:  START SPIRONOLACTONE 25 MG DAILY   *If you need a refill on your cardiac medications before your next appointment, please call your pharmacy*  Lab Work: BMET IN 1 WEEK If you have labs (blood work) drawn today and your tests are completely normal, you will receive your results only by: [name] MyChart Message (if you have MyChart) OR . A paper copy in the mail If you have any lab test that is abnormal or we need to change your treatment, we will call you to review the results.  Testing/Procedures: NONE   Follow-Up: At [facility] [place], you and your health needs are our priority.  As part of our continuing mission to provide you with exceptional heart care, we have created designated Provider Care Teams.  These Care Teams include your primary Cardiologist (physician) and Advanced Practice Providers (APPs -  Physician Assistants and Nurse Practitioners) who all work together to provide you with the care you need, when you need it.  10/05/2019 AT 11:40 WITH DR [facility]

## 2019-09-05 NOTE — Progress Notes (Signed)
Virtual Visit via Phone Note    Evaluation Performed:  Follow-up visit  This visit type was conducted due to national recommendations for restrictions regarding the COVID-19 Pandemic (e.g. social distancing).  This format is felt to be most appropriate for this patient at this time.  All issues noted in this document were discussed and addressed.  No physical exam was performed (except for noted visual exam findings with Video Visits).  Please refer to the patient's chart (MyChart message for video visits and phone note for telephone visits) for the patient's consent to telehealth for [facility].  Date:  09/05/2019   ID:  Shannon [name], DOB [birth year], MRN [id number]  Patient Location:  Home  Provider location:   Office  PCP:  Shannon Falcon, MD  Cardiologist:  Shannon Latch, MD  Electrophysiologist:  None   Chief Complaint:  hypertension  History of Present Illness:    Shannon [name] is a 64 y.o. female with hypertension, paroxysmal atrial flutter, asthma, medication non-compliance, non-obstructive CAD, OSA, prior PE, moderate pulmonary hypertension, mild ascending aorta aneurysm, PAD, and HIV (well-controlled) who presents via video conferencing for a telehealth visit today. Shannon [name] was initially referred by Shannon Basques, MD, on 12/26/15. Shannon [name] was concerned about her lower extremity edema and shortness of breath. She was started on lasix 20 mg every other day. Shannon [name] noted shortness of breath after a hospitalization for pneumonia 06/2015. She denied lower extremity edema or orthopnea but did report chest pain when laying down at night. She was referred for an echo 01/2016 that revealed LVEF 60-65% awith grade 1 diastolic dysfunction and hypokinesis of the basal inferior wall. She also had very mild aortic stenosis with a mean gradient of 9 mmHg and trivial AR. PASP was 52 mmHg. She had a Lexiscan Myoviewat that time that was negative for ischemia. Shannon [name]  previously had a Lexiscan Cardiolite in September 2013 that revealed a medium, mild reversible defect in the anterior wall and normal systolic function. She subsequently underwent cardiac catheterization that revealed nonobstructive coronary disease. She had mild pulmonary hypertension and a normal cardiac output. Left ventriculography revealed mild to moderate aortic regurgitation. She had a 24-hour Holter in February 2015 that revealed rare PACs and PVCs. Echo 01/06/17 revealed LVEF 60-65% with mild LVH and a mean aortic valve gradient of 8 mmHg. Tricuspid regurgitation was not significant enough to assess pulmonary pressures.  Repeat echo 01/2019 revealed LVEF >65%, severe LVH, intracavitary gradient 61 mmHg, mild AS (mean gradient 13 mmHg), mild-moderate AR, and mild ascending aorta aneurysm (4.1 cm).  Shannon [name] was admitted to the hospital 01/25/17 with hypertensive emergency and chest pain. Her blood pressure was 215/100.  She consistently struggles with mediation adherence and has strong feelings about what medicines she should take and when she should take them.  [name] a lot of help there but hasn't been going lately 2/2 COVID-19.  Lisinopril was stopped due to cough and throat swelling.  Shannon [name] reported claudication and had ABIs performed by her PCP, Shannon [name]. The full results are not available, but it was reportedly abnormal.ABI/Dopplers revealed moderate R LE arterial disease and mild L LE disease.  She was referred to our vascular specialists.  At her last appointment she was not taking her antihypertensives.  She was urged to start back taking them as prescribed.  She reports that her blood pressure has been elevated greater than 130/80 lately.  She continues to complain of constipation.  She has no chest pain and her breathing has been stable.  She is scheduled for repeat echocardiogram but is unsure why.   Prior CV  studies:   The following studies were reviewed today:  Echo 01/06/17: Study Conclusions  - Left ventricle: The cavity size was normal. Wall thickness was   increased in a pattern of mild LVH. Systolic function was normal.   The estimated ejection fraction was in the range of 60% to 65%.   Wall motion was normal; there were no regional wall motion   abnormalities. Doppler parameters are consistent with abnormal   left ventricular relaxation (grade 1 diastolic dysfunction). - Aortic valve: Trileaflet; moderately calcified leaflets.   Sclerosis without stenosis. There was mild to moderate   regurgitation. Mean gradient (S): 8 mm Hg. - Mitral valve: There was no significant regurgitation. - Left atrium: The atrium was moderately dilated. - Right ventricle: The cavity size was normal. Systolic function   was normal. - Right atrium: The atrium was mildly dilated. - Pulmonary arteries: No complete TR doppler jet so unable to   estimate PA systolic pressure. - Systemic veins: IVC measured 2.2 cm with > 50% respirophasic   variation, suggesting RA pressure 8 mmHg.  Impressions:  - Normal LV size with mild LV hypertrophy. EF 60-65%. Calcified,   trileaflet aortic valve. Sclerosis without significant stenosis,   mild to moderate aortic insufficiency. Normal RV size and   systolic function. Biatrial enlargement.  Past Medical History:  Diagnosis Date   Anemia    Anxiety    HX PANIC ATTACKS   Arthritis    "starting to; in my hands" (07/09/2015)   Asthma    Atrial fibrillation (HCC)    Atrial flutter, paroxysmal (HCC)    Bloated abdomen    CFS (chronic fatigue syndrome)    Chewing difficulty    Chronic asthma with acute exacerbation    "I have chronic asthma all the time; sometimes exacerbations" (07/09/2015)   Chronic lower back pain    COPD (chronic obstructive pulmonary disease) ([place])    Cyst of right kidney    "3 of them; dx'd in ~ 01/2015"   Dyspnea    GERD  (gastroesophageal reflux disease)    Heart murmur    History of blood transfusion    "related to my brain surgery I think"   History of pulmonary embolism 07/09/2015   HIV antibody positive ([place])    HIV disease ([place])    Hyperlipidemia    Hypertension    Leg edema    Lipodystrophy    Mild CAD 2013   Palpitations    Pneumonia 07/09/2015   Shingles    Sleep apnea    "never completed part 2 of study; never wore mask" (07/09/2015)   Vitamin B 12 deficiency    Vitamin D deficiency    Past Surgical History:  Procedure Laterality Date   ABDOMINAL HYSTERECTOMY     "robotic laparosopic"   [place]   "brain tumor; benign; on top of my brain; got a plate in there"   CARDIAC CATHETERIZATION     TONSILLECTOMY AND ADENOIDECTOMY       Current Meds  Medication Sig   acetaminophen (TYLENOL) 325 MG tablet Take 325 mg by mouth every 6 (six) hours as needed (FOR PAIN).   albuterol (PROVENTIL) (2.5 MG/3ML) 0.083% nebulizer solution USE 1 VIAL VIA NEBULIZER EVERY 6 HOURS AS NEEDED FOR WHEEZING OR SHORTNESS OF BREATH  amoxicillin (AMOXIL) 500 MG capsule TAKE 1 CAPSULE(500 MG) BY MOUTH THREE TIMES DAILY FOR 10 DAYS   Ascorbic Acid (VITAMIN C) 1000 MG tablet Take 1,000 mg by mouth 2 (two) times a week.    azelastine (OPTIVAR) 0.05 % ophthalmic solution Place 2 drops into both eyes 2 (two) times daily as needed (for swelling or itching).   BIKTARVY 50-200-25 MG TABS tablet TAKE 1 TABLET BY MOUTH DAILY   BIOTIN PO Take 1 tablet by mouth daily as needed (for supplementation).    Cholecalciferol (SM VITAMIN D3) 100 MCG (4000 UT) CAPS Take 1 capsule (4,000 Units total) by mouth daily.   cloNIDine (CATAPRES - DOSED IN MG/24 HR) 0.2 mg/24hr patch UNWRAP AND APPLY 1 PATCH TO SKIN ONCE A WEEK   cyanocobalamin (,VITAMIN B-12,) 1000 MCG/ML injection ADMINISTER 1 ML(1000 MCG) IN THE MUSCLE EVERY 30 DAYS   diltiazem (CARDIZEM CD) 240 MG 24 hr capsule TAKE 1 CAPSULE(240  MG) BY MOUTH DAILY   fluticasone (FLONASE) 50 MCG/ACT nasal spray Place 2 sprays into both nostrils daily. (Patient taking differently: Place 2 sprays into both nostrils daily as needed for allergies. )   furosemide (LASIX) 40 MG tablet Take 1 tablet (40 mg total) by mouth daily.   MAGNESIUM PO Take by mouth every other day.   Melatonin 5 MG TABS Take 5 mg by mouth at bedtime as needed (for sleep).   Misc. Devices (PULSE OXIMETER FOR FINGER) MISC 1 Units by Does not apply route as needed.   montelukast (SINGULAIR) 10 MG tablet Take 1 tablet (10 mg total) by mouth at bedtime. (Patient taking differently: Take 10 mg by mouth daily as needed (allergies). )   Mouthwashes (BIOTENE DRY MOUTH GENTLE) LIQD Use as directed 1 Dose in the mouth or throat 2 (two) times daily.   omeprazole (PRILOSEC) 40 MG capsule Take 1 capsule (40 mg total) by mouth daily. (Patient taking differently: Take 40 mg by mouth as needed. )   OXYGEN Inhale 1.5-2 L into the lungs continuous.    polyethylene glycol (MIRALAX / GLYCOLAX) packet Take 17 g by mouth daily. (Patient taking differently: Take 17 g by mouth daily as needed for mild constipation. )   PREVIDENT 5000 SENSITIVE 1.1-5 % PSTE Use daily as directed   PROAIR HFA 108 (90 Base) MCG/ACT inhaler INHALE 2 PUFFS INTO THE LUNGS EVERY 4 HOURS AS NEEDED FOR WHEEZING OR SHORTNESS OF BREATH   senna-docusate (SENOKOT-S) 8.6-50 MG tablet Take 1 tablet by mouth at bedtime as needed for mild constipation.   SYRINGE/NEEDLE, DISP, 1 ML (B-D SYRINGE/NEEDLE 1CC/25GX5/8) 25G X 5/8" 1 ML MISC 1 Units by Does not apply route every 30 (thirty) days.   XARELTO 20 MG TABS tablet TAKE 1 TABLET(20 MG) BY MOUTH DAILY WITH SUPPER     Allergies:   Tree extract, Augmentin [amoxicillin-pot clavulanate], Lisinopril, and Ciprofloxacin   Social History   Tobacco Use   Smoking status: Never Smoker   Smokeless tobacco: Never Used  Substance Use Topics   Alcohol use: Yes     Alcohol/week: 0.0 standard drinks    Comment: Rarely.   Drug use: No     Family Hx: The patient's family history includes Asthma in her mother; Diabetes in her sister; Heart disease in her mother; Heart failure in her mother; Heart murmur in her brother, sister, and sister; Obesity in her mother; Thyroid disease in her mother and sister.  ROS:   Please see the history of present illness.  All other systems reviewed and are negative.   Labs/Other Tests and Data Reviewed:    Recent Labs: 02/03/2019: ALT 20 05/26/2019: B Natriuretic Peptide 58.4; BUN 19; Creatinine, Ser 1.35; Hemoglobin 12.8; Platelets 187; Potassium 4.0; Sodium 143; TSH 0.673   Recent Lipid Panel Lab Results  Component Value Date/Time   CHOL 253 (H) 05/03/2018 10:43 AM   TRIG 53 05/03/2018 10:43 AM   HDL 86 05/03/2018 10:43 AM   CHOLHDL 2.9 05/03/2018 10:43 AM   CHOLHDL 3.2 11/26/2016 04:18 PM   LDLCALC 156 (H) 05/03/2018 10:43 AM    Wt Readings from Last 3 Encounters:  09/05/19 230 lb (104.3 kg)  08/23/19 233 lb 3.2 oz (105.8 kg)  06/12/19 230 lb 14.4 oz (104.7 kg)     Objective:    BP (!) 160/93    Pulse 95    Temp (!) 96.7 F (35.9 C)    Ht 5\' 3"  (1.6 m)    Wt 230 lb (104.3 kg)    SpO2 93%    BMI 40.74 kg/m  GENERAL: Well-appearing.  No acute distress. HEENT: Pupils equal round.  Oral mucosa unremarkable NECK:  No jugular venous distention, no visible thyromegaly EXT:  No edema, no cyanosis no clubbing SKIN:  No rashes no nodules NEURO:  Speech fluent.  Cranial nerves grossly intact.  Moves all 4 extremities freely PSYCH:  Cognitively intact, oriented to person place and time   ASSESSMENT & PLAN:    # Hypertension:BPremains poorly controlled she reports that she has been taking her medications as prescribed.  We will add spironolactone 25 mg daily.  Check a basic metabolic panel in 1 week.  She has severe LVH 2/2 poorly controlled hypertension.    She reports that her weight has been stable  lately.  # Aortic regurgitation # Aortic stenosis: Mild aortic stenosis on echo.  Mean gradient 13 mmHg.  No need to repeat echo at this time.  She is due for repeat echo 01/2020 to assess her aortic aneurysm.  # Hyperlipidemia:Lipids are elevated.  Given her PAD LDL goal is less than 70.  However it is very difficult to get her to take any medications.  Therefore we will have to address 1 medicine at a time.  Continue working on hypertension as above.  Will discuss statins once her blood pressure is better controlled.   # Atrial flutter: Converted with flecainide. Continue diltiazem and Xarelto.   # Ascending aorta aneurysm: 4.0 on CT 12/2016.It was unchanged on MRA 10/2018.  4.1 cm on echo 01/2019.  Will repeat 01/2020.  BP control as above.   # PAD: Lipid control as above.  Refer to Dr. Gwenlyn Found or Fletcher Anon.   COVID-19 Education: The signs and symptoms of COVID-19 were discussed with the patient and how to seek care for testing (follow up with PCP or arrange E-visit).  The importance of social distancing was discussed today.  Patient Risk:   After full review of this patient's clinical status, I feel that they are at least moderate risk at this time.  Time:   Today, I have spent 26 minutes with the patient with telehealth technology discussing hypertension.   Medication Adjustments/Labs and Tests Ordered: Current medicines are reviewed at length with the patient today.  Concerns regarding medicines are outlined above.   Tests Ordered: Orders Placed This Encounter  Procedures   Basic metabolic panel   Medication Changes: Meds ordered this encounter  Medications   spironolactone (ALDACTONE) 25 MG tablet    Sig:  Take 1 tablet (25 mg total) by mouth daily.    Dispense:  90 tablet    Refill:  1    Disposition:  Follow up in 1 month.  Signed, Shannon Latch, MD  09/05/2019 7:29 PM    [place]

## 2019-09-06 ENCOUNTER — Other Ambulatory Visit: Payer: Self-pay | Admitting: *Deleted

## 2019-09-06 NOTE — Progress Notes (Signed)
Pt is asking for  CLOBETASOL 0.05, APPLY SPARINGLY TO SOLES OR PALMS EVERY OTHER DAY AS NEEDED  Tried to call pt and ask her to call past provider, no answer, ph rang many times

## 2019-09-07 ENCOUNTER — Encounter: Payer: Self-pay | Admitting: Internal Medicine

## 2019-09-07 ENCOUNTER — Ambulatory Visit (INDEPENDENT_AMBULATORY_CARE_PROVIDER_SITE_OTHER): Payer: Medicare Other | Admitting: Internal Medicine

## 2019-09-07 ENCOUNTER — Other Ambulatory Visit: Payer: Self-pay

## 2019-09-07 VITALS — BP 152/93 | HR 86 | Temp 98.4°F | Ht 63.0 in | Wt 234.0 lb

## 2019-09-07 DIAGNOSIS — N183 Chronic kidney disease, stage 3 unspecified: Secondary | ICD-10-CM

## 2019-09-07 DIAGNOSIS — E669 Obesity, unspecified: Secondary | ICD-10-CM | POA: Diagnosis not present

## 2019-09-07 DIAGNOSIS — B20 Human immunodeficiency virus [HIV] disease: Secondary | ICD-10-CM

## 2019-09-07 DIAGNOSIS — R739 Hyperglycemia, unspecified: Secondary | ICD-10-CM | POA: Diagnosis not present

## 2019-09-07 DIAGNOSIS — Z79899 Other long term (current) drug therapy: Secondary | ICD-10-CM | POA: Diagnosis not present

## 2019-09-07 NOTE — Progress Notes (Signed)
RFV: follow up for hiv disease  Patient ID: Shannon [name], female   DOB: 1955-12-15, 64 y.o.   MRN: [id number]  HPI CD 4 count at 763/VL<20 (sept) on biktarvy. She has multple providers to help with her current health issues. She is however uptodate with health maintenance - also has upcoming colon ca screening with [facility] coming up.  Had mammo and pap smear last week. She has been sob which also led to her being followed by Cardiologist - cardiomegaly ROS:   constipation - 1 bm every 4-5 days. She wonders if that contributes to some of her abdominal discomfort. Sob with exertion but no LE swelling or chest pain. Weight gain. 12 point ros is otherwise negative.  Outpatient Encounter Medications as of 09/07/2019  Medication Sig  . acetaminophen (TYLENOL) 325 MG tablet Take 325 mg by mouth every 6 (six) hours as needed (FOR PAIN).  [name] albuterol (PROVENTIL) (2.5 MG/3ML) 0.083% nebulizer solution USE 1 VIAL VIA NEBULIZER EVERY 6 HOURS AS NEEDED FOR WHEEZING OR SHORTNESS OF BREATH  . amoxicillin (AMOXIL) 500 MG capsule TAKE 1 CAPSULE(500 MG) BY MOUTH THREE TIMES DAILY FOR 10 DAYS  . Ascorbic Acid (VITAMIN C) 1000 MG tablet Take 1,000 mg by mouth 2 (two) times a week.   [name] azelastine (OPTIVAR) 0.05 % ophthalmic solution Place 2 drops into both eyes 2 (two) times daily as needed (for swelling or itching).  [name] BIKTARVY 50-200-25 MG TABS tablet TAKE 1 TABLET BY MOUTH DAILY  . BIOTIN PO Take 1 tablet by mouth daily as needed (for supplementation).   . Cholecalciferol (SM VITAMIN D3) 100 MCG (4000 UT) CAPS Take 1 capsule (4,000 Units total) by mouth daily.  . cloNIDine (CATAPRES - DOSED IN MG/24 HR) 0.2 mg/24hr patch UNWRAP AND APPLY 1 PATCH TO SKIN ONCE A WEEK  . cyanocobalamin (,VITAMIN B-12,) 1000 MCG/ML injection ADMINISTER 1 ML(1000 MCG) IN THE MUSCLE EVERY 30 DAYS  . diltiazem (CARDIZEM CD) 240 MG 24 hr capsule TAKE 1 CAPSULE(240 MG) BY MOUTH DAILY  . fluticasone (FLONASE) 50 MCG/ACT nasal  spray Place 2 sprays into both nostrils daily. (Patient taking differently: Place 2 sprays into both nostrils daily as needed for allergies. )  . MAGNESIUM PO Take by mouth every other day.  . Melatonin 5 MG TABS Take 5 mg by mouth at bedtime as needed (for sleep).  . Misc. Devices (PULSE OXIMETER FOR FINGER) MISC 1 Units by Does not apply route as needed.  . montelukast (SINGULAIR) 10 MG tablet Take 1 tablet (10 mg total) by mouth at bedtime. (Patient taking differently: Take 10 mg by mouth daily as needed (allergies). )  . Mouthwashes (BIOTENE DRY MOUTH GENTLE) LIQD Use as directed 1 Dose in the mouth or throat 2 (two) times daily.  [name] omeprazole (PRILOSEC) 40 MG capsule Take 1 capsule (40 mg total) by mouth daily. (Patient taking differently: Take 40 mg by mouth as needed. )  . OXYGEN Inhale 1.5-2 L into the lungs continuous.   . polyethylene glycol (MIRALAX / GLYCOLAX) packet Take 17 g by mouth daily. (Patient taking differently: Take 17 g by mouth daily as needed for mild constipation. )  . PREVIDENT 5000 SENSITIVE 1.1-5 % PSTE Use daily as directed  . PROAIR HFA 108 (90 Base) MCG/ACT inhaler INHALE 2 PUFFS INTO THE LUNGS EVERY 4 HOURS AS NEEDED FOR WHEEZING OR SHORTNESS OF BREATH  . senna-docusate (SENOKOT-S) 8.6-50 MG tablet Take 1 tablet by mouth at bedtime as needed for mild constipation.  [name]  spironolactone (ALDACTONE) 25 MG tablet Take 1 tablet (25 mg total) by mouth daily.  . SYRINGE/NEEDLE, DISP, 1 ML (B-D SYRINGE/NEEDLE 1CC/25GX5/8) 25G X 5/8" 1 ML MISC 1 Units by Does not apply route every 30 (thirty) days.  Alveda Reasons 20 MG TABS tablet TAKE 1 TABLET(20 MG) BY MOUTH DAILY WITH SUPPER  . diclofenac sodium (VOLTAREN) 1 % GEL Apply 2 g topically 3 (three) times daily as needed (arm pain). (Patient not taking: Reported on 09/05/2019)  . furosemide (LASIX) 40 MG tablet Take 1 tablet (40 mg total) by mouth daily.  . potassium chloride SA (K-DUR,KLOR-CON) 20 MEQ tablet Take 20 mEq by mouth every  other day.   No facility-administered encounter medications on file as of 09/07/2019.     Patient Active Problem List   Diagnosis Date Noted  . (HFpEF) heart failure with preserved ejection fraction ([place]) 02/07/2019  . Leg swelling 11/03/2018  . Other fatigue 06/30/2018  . Shortness of breath on exertion 06/30/2018  . Hyperglycemia 06/30/2018  . Vitamin D deficiency 06/30/2018  . Chronic anticoagulation 01/03/2018  . Constipation 11/03/2017  . Atrial flutter ([place]) 03/11/2017  . CAD (coronary artery disease) 01/28/2017  . Obesity (BMI 30-39.9) 01/13/2017  . Ascending aorta dilatation (HCC) 01/07/2017  . Low back pain radiating to right lower extremity 12/02/2016  . Acquired cyst of kidney 12/02/2016  . Chronic kidney disease (CKD), stage III (moderate) 12/01/2016  . Generalized anxiety disorder 10/22/2016  . Hypersomnia 10/01/2016  . Accessory skin tags 06/10/2016  . Nocturnal hypoxemia 05/13/2016  . Cervical radiculopathy 04/14/2016  . GERD (gastroesophageal reflux disease) 01/02/2016  . Vitamin B12 deficiency 10/02/2015  . Moderate persistent asthma with exacerbation 07/09/2015  . HIV disease ([place]) 07/09/2015  . Uncontrolled hypertension 07/09/2015     Health Maintenance Due  Topic Date Due  . TETANUS/TDAP  03/03/1973  . COLONOSCOPY  03/03/2004  . MAMMOGRAM  03/26/2018  . DEXA SCAN  03/04/2019  . PNA vac Low Risk Adult (1 of 2 - PCV13) 03/04/2019  . INFLUENZA VACCINE  03/25/2019  . COLON CANCER SCREENING ANNUAL FOBT  05/06/2019    sochx : no smoking or drinking  Physical Exam   BP (!) 152/93   Pulse 86   Temp 98.4 F (36.9 C)   Ht 5\' 3"  (1.6 m)   Wt 234 lb (106.1 kg)   SpO2 92%   BMI 41.45 kg/m   Physical Exam  Constitutional:  oriented to person, place, and time. appears well-developed and well-nourished. No distress.  HENT: [place]/AT, PERRLA, no scleral icterus Mouth/Throat: Oropharynx is clear and moist. No oropharyngeal exudate.  Cardiovascular: Normal  rate, regular rhythm and normal heart sounds. Exam reveals no gallop and no friction rub.  No murmur heard.  Pulmonary/Chest: Effort normal and breath sounds normal. No respiratory distress.  has no wheezes.  Neck = supple, no nuchal rigidity Abdominal: Soft. Bowel sounds are normal.  exhibits no distension. There is no tenderness.  Lymphadenopathy: no cervical adenopathy. No axillary adenopathy Neurological: alert and oriented to person, place, and time.  Skin: Skin is warm and dry. No rash noted. No erythema.  Psychiatric: a normal mood and affect.  behavior is normal.   Lab Results  Component Value Date   CD4TCELL 40 03/14/2019   Lab Results  Component Value Date   CD4TABS 463 03/14/2019   CD4TABS 595 02/08/2019   CD4TABS 640 11/03/2018   Lab Results  Component Value Date   HIV1RNAQUANT <20 NOT DETECTED 03/14/2019   No results  found for: HEPBSAB Lab Results  Component Value Date   LABRPR NON-REACTIVE 12/13/2017    CBC Lab Results  Component Value Date   WBC 5.2 05/26/2019   RBC 4.15 05/26/2019   HGB 12.8 05/26/2019   HCT 40.3 05/26/2019   PLT 187 05/26/2019   MCV 97.1 05/26/2019   MCH 30.8 05/26/2019   MCHC 31.8 05/26/2019   RDW 12.8 05/26/2019   LYMPHSABS 1.4 02/03/2019   MONOABS 0.4 02/03/2019   EOSABS 0.1 02/03/2019    BMET Lab Results  Component Value Date   NA 143 05/26/2019   K 4.0 05/26/2019   CL 103 05/26/2019   CO2 29 05/26/2019   GLUCOSE 107 (H) 05/26/2019   BUN 19 05/26/2019   CREATININE 1.35 (H) 05/26/2019   CALCIUM 9.9 05/26/2019   GFRNONAA 41 (L) 05/26/2019   GFRAA 48 (L) 05/26/2019      Assessment and Plan  hiv disease = biktarvy continue/ will check labs  Morbid obesity = involved with healthy weight and wellness program, continues with program. Will check for DM  ckd 3 = will check cr again today to see that she is still at her baseline  covid vaccine = will to try to get vaccine

## 2019-09-07 NOTE — Patient Instructions (Signed)
COVID-19 Vaccine Information can be found at: [email or web address] For questions related to vaccine distribution or appointments, please email [email or web address] or call [phone].    

## 2019-09-08 LAB — URINALYSIS
Bilirubin Urine: NEGATIVE
Glucose, UA: NEGATIVE
Hgb urine dipstick: NEGATIVE
Leukocytes,Ua: NEGATIVE
Nitrite: NEGATIVE
Specific Gravity, Urine: 1.024 (ref 1.001–1.03)
pH: 5.5 (ref 5.0–8.0)

## 2019-09-08 LAB — MICROALBUMIN / CREATININE URINE RATIO
Creatinine, Urine: 255 mg/dL (ref 20–275)
Microalb Creat Ratio: 24 mcg/mg creat (ref <30)
Microalb, Ur: 6 mg/dL

## 2019-09-08 LAB — T-HELPER CELL (CD4) - (RCID CLINIC ONLY)
CD4 % Helper T Cell: 39 % (ref 33–65)
CD4 T Cell Abs: 530 /uL (ref 400–1790)

## 2019-09-08 NOTE — Progress Notes (Signed)
Please ask patient to call provider who prescribed this.  I do not have on her current med list and do not know what it is for?  Thanks

## 2019-09-13 DIAGNOSIS — J45909 Unspecified asthma, uncomplicated: Secondary | ICD-10-CM | POA: Diagnosis not present

## 2019-09-13 DIAGNOSIS — R269 Unspecified abnormalities of gait and mobility: Secondary | ICD-10-CM | POA: Diagnosis not present

## 2019-09-13 DIAGNOSIS — G4733 Obstructive sleep apnea (adult) (pediatric): Secondary | ICD-10-CM | POA: Diagnosis not present

## 2019-09-13 DIAGNOSIS — J449 Chronic obstructive pulmonary disease, unspecified: Secondary | ICD-10-CM | POA: Diagnosis not present

## 2019-09-13 LAB — CBC WITH DIFFERENTIAL/PLATELET
Absolute Monocytes: 489 cells/uL (ref 200–950)
Basophils Absolute: 20 cells/uL (ref 0–200)
Basophils Relative: 0.3 %
Eosinophils Absolute: 67 cells/uL (ref 15–500)
Eosinophils Relative: 1 %
HCT: 37.4 % (ref 35.0–45.0)
Hemoglobin: 12.3 g/dL (ref 11.7–15.5)
Lymphs Abs: 1434 cells/uL (ref 850–3900)
MCH: 29.9 pg (ref 27.0–33.0)
MCHC: 32.9 g/dL (ref 32.0–36.0)
MCV: 91 fL (ref 80.0–100.0)
MPV: 10.1 fL (ref 7.5–12.5)
Monocytes Relative: 7.3 %
Neutro Abs: 4690 cells/uL (ref 1500–7800)
Neutrophils Relative %: 70 %
Platelets: 191 10*3/uL (ref 140–400)
RBC: 4.11 10*6/uL (ref 3.80–5.10)
RDW: 12.9 % (ref 11.0–15.0)
Total Lymphocyte: 21.4 %
WBC: 6.7 10*3/uL (ref 3.8–10.8)

## 2019-09-13 LAB — COMPLETE METABOLIC PANEL WITH GFR
AG Ratio: 1.5 (calc) (ref 1.0–2.5)
ALT: 17 U/L (ref 6–29)
AST: 22 U/L (ref 10–35)
Albumin: 4.1 g/dL (ref 3.6–5.1)
Alkaline phosphatase (APISO): 77 U/L (ref 37–153)
BUN/Creatinine Ratio: 13 (calc) (ref 6–22)
BUN: 18 mg/dL (ref 7–25)
CO2: 35 mmol/L — ABNORMAL HIGH (ref 20–32)
Calcium: 10.1 mg/dL (ref 8.6–10.4)
Chloride: 103 mmol/L (ref 98–110)
Creat: 1.34 mg/dL — ABNORMAL HIGH (ref 0.50–0.99)
GFR, Est African American: 48 mL/min/{1.73_m2} — ABNORMAL LOW (ref >=60)
GFR, Est Non African American: 41 mL/min/{1.73_m2} — ABNORMAL LOW (ref >=60)
Globulin: 2.7 g/dL (calc) (ref 1.9–3.7)
Glucose, Bld: 101 mg/dL — ABNORMAL HIGH (ref 65–99)
Potassium: 4 mmol/L (ref 3.5–5.3)
Sodium: 144 mmol/L (ref 135–146)
Total Bilirubin: 0.4 mg/dL (ref 0.2–1.2)
Total Protein: 6.8 g/dL (ref 6.1–8.1)

## 2019-09-13 LAB — HEMOGLOBIN A1C
Hgb A1c MFr Bld: 5.7 % of total Hgb — ABNORMAL HIGH (ref <5.7)
Mean Plasma Glucose: 117 (calc)
eAG (mmol/L): 6.5 (calc)

## 2019-09-13 LAB — HIV-1 RNA QUANT-NO REFLEX-BLD
HIV 1 RNA Quant: <20 copies/mL
HIV-1 RNA Quant, Log: <1.3 Log copies/mL

## 2019-09-28 ENCOUNTER — Other Ambulatory Visit: Payer: Self-pay | Admitting: Internal Medicine

## 2019-09-28 DIAGNOSIS — R109 Unspecified abdominal pain: Secondary | ICD-10-CM | POA: Diagnosis not present

## 2019-10-03 ENCOUNTER — Ambulatory Visit (INDEPENDENT_AMBULATORY_CARE_PROVIDER_SITE_OTHER): Payer: Medicare Other | Admitting: Internal Medicine

## 2019-10-03 ENCOUNTER — Other Ambulatory Visit: Payer: Self-pay

## 2019-10-03 ENCOUNTER — Encounter: Payer: Self-pay | Admitting: Internal Medicine

## 2019-10-03 VITALS — BP 152/90 | HR 96 | Temp 98.3°F | Ht 62.75 in | Wt 230.5 lb

## 2019-10-03 DIAGNOSIS — R109 Unspecified abdominal pain: Secondary | ICD-10-CM | POA: Diagnosis not present

## 2019-10-03 DIAGNOSIS — G8929 Other chronic pain: Secondary | ICD-10-CM

## 2019-10-03 DIAGNOSIS — F418 Other specified anxiety disorders: Secondary | ICD-10-CM

## 2019-10-03 DIAGNOSIS — K5909 Other constipation: Secondary | ICD-10-CM

## 2019-10-03 NOTE — Progress Notes (Signed)
Shannon [name] 64 y.o. 07-04-1956 [id number]  Assessment & Plan:   Encounter Diagnoses  Name Primary?  . Chronic constipation Yes  . Chronic abdominal pain   . Anxiety about health    Her problems sound similar to what I have seen her for in the past which is reassuring.  She is to use bisacodyl and/or cutrate of Mg every 3 days prn - advised not to go more than 3 days w/o significant defecation  She has a very low FEV1, is on home O2 and is not a good candidate for sedation should she need endoscopic evaluation, which she does not at this time and I would not pursue screening exam due to her bad lung disease and co-morbidities  She remains very anxious about her health and was satisfied with my reassurance and advice today Cc:Shannon Falcon, MD Shannon Chamber, MD    Subjective:   Chief Complaint:  Abdominal pain, constipation  HPI Shannon [name] is c/o epigastric pain, bloating and severe constipation as she has done in past, last visit 11/2017. Also some early satiety.  She reports that she may go 10 days w/o "good bowel movement" She does say that 1/2 a bottle of citrate of Mg is effeciove when taken and that dulcolax will work also.  Recent visits to GYN and she reports she was told the [place] she had diverticulosis and inflammation and that her lower abdominal pain is GI in origin. Also ? Of having a colonoscopy.  In 2018 I considered that - she did not do so (or an EGD) and subsequently in 2019 had FEV1 0.64 and we decided not to pursue endoscopi screening or diagnostic evaluations CT scanning was w/o significant abnormalities w/ respect to GI tract.  She is very worried about gut inflammation "reactivating my HIV which has been under control for 20 years"   Allergies  Allergen Reactions  . Tree Extract Swelling and Other (See Comments)    Swelling to eyes  . Augmentin [Amoxicillin-Pot Clavulanate] Other (See Comments)    Headache, dizzy  . Lisinopril Cough   Face/throat swelling  . Ciprofloxacin Hives   Current Meds  Medication Sig  . acetaminophen (TYLENOL) 325 MG tablet Take 325 mg by mouth every 6 (six) hours as needed (FOR PAIN).  [name] albuterol (PROVENTIL) (2.5 MG/3ML) 0.083% nebulizer solution USE 1 VIAL VIA NEBULIZER EVERY 6 HOURS AS NEEDED FOR WHEEZING OR SHORTNESS OF BREATH  . amoxicillin (AMOXIL) 500 MG capsule TAKE 1 CAPSULE(500 MG) BY MOUTH THREE TIMES DAILY FOR 10 DAYS  . Ascorbic Acid (VITAMIN C) 1000 MG tablet Take 1,000 mg by mouth 2 (two) times a week.   [name] azelastine (OPTIVAR) 0.05 % ophthalmic solution Place 2 drops into both eyes 2 (two) times daily as needed (for swelling or itching).  [name] BIKTARVY 50-200-25 MG TABS tablet TAKE 1 TABLET BY MOUTH DAILY  . BIOTIN PO Take 1 tablet by mouth daily as needed (for supplementation).   . Cholecalciferol (SM VITAMIN D3) 100 MCG (4000 UT) CAPS Take 1 capsule (4,000 Units total) by mouth daily.  . cloNIDine (CATAPRES - DOSED IN MG/24 HR) 0.2 mg/24hr patch UNWRAP AND APPLY 1 PATCH TO SKIN ONCE A WEEK  . cyanocobalamin (,VITAMIN B-12,) 1000 MCG/ML injection ADMINISTER 1 ML(1000 MCG) IN THE MUSCLE EVERY 30 DAYS  . diclofenac sodium (VOLTAREN) 1 % GEL Apply 2 g topically 3 (three) times daily as needed (arm pain).  [name] diltiazem (CARDIZEM CD) 240 MG 24 hr capsule TAKE  1 CAPSULE(240 MG) BY MOUTH DAILY  . fluticasone (FLONASE) 50 MCG/ACT nasal spray Place 2 sprays into both nostrils daily. (Patient taking differently: Place 2 sprays into both nostrils daily as needed for allergies. )  . MAGNESIUM PO Take by mouth every other day.  . Melatonin 5 MG TABS Take 5 mg by mouth at bedtime as needed (for sleep).  . Misc. Devices (PULSE OXIMETER FOR FINGER) MISC 1 Units by Does not apply route as needed.  . montelukast (SINGULAIR) 10 MG tablet Take 1 tablet (10 mg total) by mouth at bedtime. (Patient taking differently: Take 10 mg by mouth daily as needed (allergies). )  . Mouthwashes (BIOTENE DRY MOUTH GENTLE)  LIQD Use as directed 1 Dose in the mouth or throat 2 (two) times daily.  [name] omeprazole (PRILOSEC) 40 MG capsule Take 1 capsule (40 mg total) by mouth daily. (Patient taking differently: Take 40 mg by mouth as needed. )  . OXYGEN Inhale 1.5-2 L into the lungs continuous.   . polyethylene glycol (MIRALAX / GLYCOLAX) packet Take 17 g by mouth daily. (Patient taking differently: Take 17 g by mouth daily as needed for mild constipation. )  . potassium chloride SA (K-DUR,KLOR-CON) 20 MEQ tablet Take 20 mEq by mouth as needed.   [name] PREVIDENT 5000 SENSITIVE 1.1-5 % PSTE Use daily as directed  . PROAIR HFA 108 (90 Base) MCG/ACT inhaler INHALE 2 PUFFS INTO THE LUNGS EVERY 4 HOURS AS NEEDED FOR WHEEZING OR SHORTNESS OF BREATH  . senna-docusate (SENOKOT-S) 8.6-50 MG tablet Take 1 tablet by mouth at bedtime as needed for mild constipation.  [name] spironolactone (ALDACTONE) 25 MG tablet Take 1 tablet (25 mg total) by mouth daily.  . SYRINGE/NEEDLE, DISP, 1 ML (B-D SYRINGE/NEEDLE 1CC/25GX5/8) 25G X 5/8" 1 ML MISC 1 Units by Does not apply route every 30 (thirty) days.  Alveda Reasons 20 MG TABS tablet TAKE 1 TABLET(20 MG) BY MOUTH DAILY WITH SUPPER   Past Medical History:  Diagnosis Date  . Anemia   . Anxiety    HX PANIC ATTACKS  . Arthritis    "starting to; in my hands" (07/09/2015)  . Asthma   . Atrial fibrillation ([place])   . Atrial flutter, paroxysmal ([place])   . Bloated abdomen   . CFS (chronic fatigue syndrome)   . Chewing difficulty   . Chronic asthma with acute exacerbation    "I have chronic asthma all the time; sometimes exacerbations" (07/09/2015)  . Chronic lower back pain   . COPD (chronic obstructive pulmonary disease) ([place])   . Cyst of right kidney    "3 of them; dx'd in ~ 01/2015"  . Dyspnea   . GERD (gastroesophageal reflux disease)   . Heart murmur   . History of blood transfusion    "related to my brain surgery I think"  . History of pulmonary embolism 07/09/2015  . HIV antibody positive  ([place])   . HIV disease ([place])   . Hyperlipidemia   . Hypertension   . Leg edema   . Lipodystrophy   . Mild CAD 2013  . Osteopenia   . Palpitations   . Pneumonia 07/09/2015  . Shingles   . Sleep apnea    "never completed part 2 of study; never wore mask" (07/09/2015)  . Vitamin B 12 deficiency   . Vitamin D deficiency    Past Surgical History:  Procedure Laterality Date  . ABDOMINAL HYSTERECTOMY     "robotic laparosopic"  . BRAIN SURGERY  1974   "  brain tumor; benign; on top of my brain; got a plate in there"  . CARDIAC CATHETERIZATION    . TONSILLECTOMY AND ADENOIDECTOMY     Social History   Social History Narrative   Originally from [place] then [place] now [place] 2016 approx   Disabled    Rare alcohol, never smoker, no drug use             family history includes Asthma in her mother; Diabetes in her sister; Heart disease in her mother; Heart failure in her mother; Heart murmur in her brother, sister, and sister; Hypertension in her mother; Obesity in her mother; Thyroid disease in her mother and sister.   Review of Systems As above  Objective:   Physical Exam BP (!) 152/90 (BP Location: Left Arm, Patient Position: Sitting, Cuff Size: Large)   Pulse 96   Temp 98.3 F (36.8 C)   Ht 5' 2.75" (1.594 m)   Wt 230 lb 8 oz (104.6 kg)   BMI 41.16 kg/m  Obese black woman NAD Anxious Eyes anicteric Abdomen is obese and mildly tender in epigastrium which worsens with abdominal tension during straight leg raise = + Carnett's sign  Data reviewed = GYN notes 2020-21 09/07/2019 labs including NL CBC, CMET except mildly reduced GFR

## 2019-10-03 NOTE — Patient Instructions (Addendum)
When you go 3 days without a bowel movement please try the following. Either take 1/2 a dose of magnesium citrate or 1 to 2 Dulcolax tablets.   Follow up with Dr Carlean Purl as needed.    I appreciate the opportunity to care for you. Silvano Rusk, MD, [facility]

## 2019-10-04 ENCOUNTER — Encounter: Payer: Self-pay | Admitting: Internal Medicine

## 2019-10-05 ENCOUNTER — Encounter: Payer: Self-pay | Admitting: Cardiovascular Disease

## 2019-10-05 ENCOUNTER — Encounter: Payer: Self-pay | Admitting: *Deleted

## 2019-10-05 ENCOUNTER — Telehealth (INDEPENDENT_AMBULATORY_CARE_PROVIDER_SITE_OTHER): Payer: Medicare Other | Admitting: Cardiovascular Disease

## 2019-10-05 VITALS — BP 140/75 | HR 97 | Temp 97.2°F | Ht 62.0 in | Wt 228.0 lb

## 2019-10-05 DIAGNOSIS — Z5181 Encounter for therapeutic drug level monitoring: Secondary | ICD-10-CM

## 2019-10-05 DIAGNOSIS — I10 Essential (primary) hypertension: Secondary | ICD-10-CM

## 2019-10-05 DIAGNOSIS — I352 Nonrheumatic aortic (valve) stenosis with insufficiency: Secondary | ICD-10-CM | POA: Diagnosis not present

## 2019-10-05 DIAGNOSIS — I483 Typical atrial flutter: Secondary | ICD-10-CM

## 2019-10-05 DIAGNOSIS — I712 Thoracic aortic aneurysm, without rupture: Secondary | ICD-10-CM | POA: Diagnosis not present

## 2019-10-05 DIAGNOSIS — I739 Peripheral vascular disease, unspecified: Secondary | ICD-10-CM

## 2019-10-05 DIAGNOSIS — E7800 Pure hypercholesterolemia, unspecified: Secondary | ICD-10-CM

## 2019-10-05 DIAGNOSIS — I4892 Unspecified atrial flutter: Secondary | ICD-10-CM | POA: Diagnosis not present

## 2019-10-05 DIAGNOSIS — E785 Hyperlipidemia, unspecified: Secondary | ICD-10-CM | POA: Diagnosis not present

## 2019-10-05 DIAGNOSIS — I2510 Atherosclerotic heart disease of native coronary artery without angina pectoris: Secondary | ICD-10-CM

## 2019-10-05 MED ORDER — CLONIDINE 0.3 MG/24HR TD PTWK: 0.3000 mg | MEDICATED_PATCH | TRANSDERMAL | 3 refills | Status: DC

## 2019-10-05 NOTE — Patient Instructions (Addendum)
Medication Instructions:  INCREASE YOUR CLONIDINE PATCH TO 0.3 MG WEEKLY   *If you need a refill on your cardiac medications before your next appointment, please call your pharmacy*  Lab Work: FASTING LP/CMET SOON   If you have labs (blood work) drawn today and your tests are completely normal, you will receive your results only by: [name] MyChart Message (if you have MyChart) OR . A paper copy in the mail If you have any lab test that is abnormal or we need to change your treatment, we will call you to review the results.  Testing/Procedures: NONE  Follow-Up: DR [facility] ON April 15 at 1:40 IN THE OFFICE   Other Instructions INCREASE YOUR EXERCISE WITH A GOAL OF 150 MINUTES EACH WEEK

## 2019-10-05 NOTE — Progress Notes (Signed)
Virtual Visit via Phone Note    Evaluation Performed:  Follow-up visit  This visit type was conducted due to national recommendations for restrictions regarding the COVID-19 Pandemic (e.g. social distancing).  This format is felt to be most appropriate for this patient at this time.  All issues noted in this document were discussed and addressed.  No physical exam was performed (except for noted visual exam findings with Video Visits).  Please refer to the patient's chart (MyChart message for video visits and phone note for telephone visits) for the patient's consent to telehealth for [facility].  Date:  10/20/2019   ID:  Shannon [name], DOB March 10, 1956, MRN [id number]  Patient Location:  Home  Provider location:   Office  PCP:  [name], MD  Cardiologist:  Skeet Latch, MD  Electrophysiologist:  None   Chief Complaint:  hypertension  History of Present Illness:    Shannon [name] is a 64 y.o. female with hypertension, paroxysmal atrial flutter, asthma, medication non-compliance, non-obstructive CAD, OSA, prior PE, moderate pulmonary hypertension, mild ascending aorta aneurysm, PAD, and HIV (well-controlled) who presents via video conferencing for a telehealth visit today. Shannon [name] was initially referred by [name], MD, on 12/26/15. [name] was concerned about her lower extremity edema and shortness of breath. She was started on lasix 20 mg every other day. [name] noted shortness of breath after a hospitalization for pneumonia 06/2015. She denied lower extremity edema or orthopnea but did report chest pain when laying down at night. She was referred for an echo 01/2016 that revealed LVEF 60-65% awith grade 1 diastolic dysfunction and hypokinesis of the basal inferior wall. She also had very mild aortic stenosis with a mean gradient of 9 mmHg and trivial AR. PASP was 52 mmHg. She had a Lexiscan Myoviewat that time that was negative for ischemia. Shannon [name]  previously had a Lexiscan Cardiolite in September 2013 that revealed a medium, mild reversible defect in the anterior wall and normal systolic function. She subsequently underwent cardiac catheterization that revealed nonobstructive coronary disease. She had mild pulmonary hypertension and a normal cardiac output. Left ventriculography revealed mild to moderate aortic regurgitation. She had a 24-hour Holter in February 2015 that revealed rare PACs and PVCs. Echo 01/06/17 revealed LVEF 60-65% with mild LVH and a mean aortic valve gradient of 8 mmHg. Tricuspid regurgitation was not significant enough to assess pulmonary pressures.  Repeat echo 01/2019 revealed LVEF >65%, severe LVH, intracavitary gradient 61 mmHg, mild AS (mean gradient 13 mmHg), mild-moderate AR, and mild ascending aorta aneurysm (4.1 cm).  Shannon [name] was admitted to the hospital 01/25/17 with hypertensive emergency and chest pain. Her blood pressure was 215/100.  She consistently struggles with mediation adherence and has strong feelings about what medicines she should take and when she should take them.  [name] a lot of help there but hasn't been going lately 2/2 COVID-19.  Lisinopril was stopped due to cough and throat swelling.  Shannon [name] reported claudication and had ABIs performed by her PCP, Dr. Daryll Drown. The full results are not available, but it was reportedly abnormal.ABI/Dopplers revealed moderate R LE arterial disease and mild L LE disease.  She was referred to our vascular specialists but prefers to keep seeing me.    Since her last appointment she has been doing well.  She has been working to lose weight.  She saw Shannon [name] 2/9 and her abdominal discomfort  was thought to be due to chronic constipation.  She has been making ginger root tea and ginger water.  This is helping her move her bowels and she is feeling much better.  She is now feeling a little  dehydrated.  She likes the clonidine patch. Her BP has been in the 140-150s.  She is walking some outside and limiting her carbohydrates.  She feels that her breathing is getting better.     Prior CV studies:   The following studies were reviewed today:  Echo 01/06/17: Study Conclusions  - Left ventricle: The cavity size was normal. Wall thickness was   increased in a pattern of mild LVH. Systolic function was normal.   The estimated ejection fraction was in the range of 60% to 65%.   Wall motion was normal; there were no regional wall motion   abnormalities. Doppler parameters are consistent with abnormal   left ventricular relaxation (grade 1 diastolic dysfunction). - Aortic valve: Trileaflet; moderately calcified leaflets.   Sclerosis without stenosis. There was mild to moderate   regurgitation. Mean gradient (S): 8 mm Hg. - Mitral valve: There was no significant regurgitation. - Left atrium: The atrium was moderately dilated. - Right ventricle: The cavity size was normal. Systolic function   was normal. - Right atrium: The atrium was mildly dilated. - Pulmonary arteries: No complete TR doppler jet so unable to   estimate PA systolic pressure. - Systemic veins: IVC measured 2.2 cm with > 50% respirophasic   variation, suggesting RA pressure 8 mmHg.  Impressions:  - Normal LV size with mild LV hypertrophy. EF 60-65%. Calcified,   trileaflet aortic valve. Sclerosis without significant stenosis,   mild to moderate aortic insufficiency. Normal RV size and   systolic function. Biatrial enlargement.  Past Medical History:  Diagnosis Date  . Anemia   . Anxiety    HX PANIC ATTACKS  . Arthritis    "starting to; in my hands" (07/09/2015)  . Asthma   . Atrial fibrillation ([place])   . Atrial flutter, paroxysmal ([place])   . Bloated abdomen   . CFS (chronic fatigue syndrome)   . Chewing difficulty   . Chronic asthma with acute exacerbation    "I have chronic asthma all the time;  sometimes exacerbations" (07/09/2015)  . Chronic lower back pain   . COPD (chronic obstructive pulmonary disease) ([place])   . Cyst of right kidney    "3 of them; dx'd in ~ 01/2015"  . Dyspnea   . GERD (gastroesophageal reflux disease)   . Heart murmur   . History of blood transfusion    "related to my brain surgery I think"  . History of pulmonary embolism 07/09/2015  . HIV antibody positive ([place])   . HIV disease ([place])   . Hyperlipidemia   . Hypertension   . Leg edema   . Lipodystrophy   . Mild CAD 2013  . Osteopenia   . Palpitations   . Pneumonia 07/09/2015  . Shingles   . Sleep apnea    "never completed part 2 of study; never wore mask" (07/09/2015)  . Vitamin B 12 deficiency   . Vitamin D deficiency    Past Surgical History:  Procedure Laterality Date  . ABDOMINAL HYSTERECTOMY     "robotic laparosopic"  . BRAIN SURGERY  1974   "brain tumor; benign; on top of my brain; got a plate in there"  . CARDIAC CATHETERIZATION    . TONSILLECTOMY AND ADENOIDECTOMY       No  outpatient medications have been marked as taking for the 10/05/19 encounter (Telemedicine) with Skeet Latch, MD.     Allergies:   Tree extract, Augmentin [amoxicillin-pot clavulanate], Lisinopril, and Ciprofloxacin   Social History   Tobacco Use  . Smoking status: Never Smoker  . Smokeless tobacco: Never Used  Substance Use Topics  . Alcohol use: Yes    Alcohol/week: 0.0 standard drinks    Comment: Rarely.  . Drug use: No     Family Hx: The patient's family history includes Asthma in her mother; Diabetes in her sister; Heart disease in her mother; Heart failure in her mother; Heart murmur in her brother, sister, and sister; Hypertension in her mother; Obesity in her mother; Thyroid disease in her mother and sister.  ROS:   Please see the history of present illness.     All other systems reviewed and are negative.   Labs/Other Tests and Data Reviewed:    Recent Labs: 05/26/2019: B  Natriuretic Peptide 58.4; TSH 0.673 09/07/2019: ALT 17; BUN 18; Creat 1.34; Hemoglobin 12.3; Platelets 191; Potassium 4.0; Sodium 144   Recent Lipid Panel Lab Results  Component Value Date/Time   CHOL 253 (H) 05/03/2018 10:43 AM   TRIG 53 05/03/2018 10:43 AM   HDL 86 05/03/2018 10:43 AM   CHOLHDL 2.9 05/03/2018 10:43 AM   CHOLHDL 3.2 11/26/2016 04:18 PM   LDLCALC 156 (H) 05/03/2018 10:43 AM    Wt Readings from Last 3 Encounters:  10/05/19 228 lb (103.4 kg)  10/03/19 230 lb 8 oz (104.6 kg)  09/07/19 234 lb (106.1 kg)     Objective:    VS:  BP 140/75   Pulse 97   Temp (!) 97.2 F (36.2 C)   Ht 5\' 2"  (1.575 m)   Wt 228 lb (103.4 kg)   BMI 41.70 kg/m  , BMI Body mass index is 41.7 kg/m. GENERAL:  Sounds well. LUNGS:  Respirations unabored NEURO:  Speech fluent PSYCH:  Cognitively intact, oriented to person place and time   ASSESSMENT & PLAN:    # Hypertension:BP is much better but not at goal.She is tolerating the clonidine patch well.  We will increase it to 0.3 mg weekly.  Continue diltiazem, lasix and spironolactone.   # Aortic regurgitation # Aortic stenosis: Mild aortic stenosis on echo.  Mean gradient 13 mmHg.  No need to repeat echo at this time.  She is due for repeat echo 01/2020 to assess her aortic aneurysm.  # Hyperlipidemia:Lipids are elevated.  Check fasting lipids/CMP when she is able to come to the office.   # Atrial flutter: Converted with flecainide. Continue diltiazem and Xarelto.   # Ascending aorta aneurysm: 4.0 on CT 12/2016.It was unchanged on MRA 10/2018.  4.1 cm on echo 01/2019.  Will repeat 01/2020.  BP control as above.   # PAD: Lipid control as above.  Refer to Dr. Gwenlyn Found or Fletcher Anon.   COVID-19 Education: The signs and symptoms of COVID-19 were discussed with the patient and how to seek care for testing (follow up with PCP or arrange E-visit).  The importance of social distancing was discussed today.  Patient Risk:   After full  review of this patient's clinical status, I feel that they are at least moderate risk at this time.  Time:   Today, I have spent 57minutes with the patient with telehealth technology discussing hypertension.   Medication Adjustments/Labs and Tests Ordered: Current medicines are reviewed at length with the patient today.  Concerns regarding medicines are  outlined above.   Tests Ordered: Orders Placed This Encounter  Procedures  . Lipid panel  . Comprehensive metabolic panel   Medication Changes: Meds ordered this encounter  Medications  . cloNIDine (CATAPRES - DOSED IN MG/24 HR) 0.3 mg/24hr patch    Sig: Place 1 patch (0.3 mg total) onto the skin once a week.    Dispense:  12 patch    Refill:  3    New dose, d/c previous rx    Disposition:  Follow up in 2 month(s)  Signed, Skeet Latch, MD  10/20/2019 4:59 PM    [facility]

## 2019-10-14 ENCOUNTER — Other Ambulatory Visit: Payer: Self-pay | Admitting: Internal Medicine

## 2019-10-14 DIAGNOSIS — R269 Unspecified abnormalities of gait and mobility: Secondary | ICD-10-CM | POA: Diagnosis not present

## 2019-10-14 DIAGNOSIS — J45909 Unspecified asthma, uncomplicated: Secondary | ICD-10-CM | POA: Diagnosis not present

## 2019-10-14 DIAGNOSIS — G4733 Obstructive sleep apnea (adult) (pediatric): Secondary | ICD-10-CM | POA: Diagnosis not present

## 2019-10-14 DIAGNOSIS — J449 Chronic obstructive pulmonary disease, unspecified: Secondary | ICD-10-CM | POA: Diagnosis not present

## 2019-10-24 ENCOUNTER — Ambulatory Visit (INDEPENDENT_AMBULATORY_CARE_PROVIDER_SITE_OTHER): Payer: Medicare Other | Admitting: Internal Medicine

## 2019-10-24 DIAGNOSIS — I10 Essential (primary) hypertension: Secondary | ICD-10-CM | POA: Diagnosis not present

## 2019-10-24 DIAGNOSIS — M791 Myalgia, unspecified site: Secondary | ICD-10-CM | POA: Diagnosis not present

## 2019-10-24 MED ORDER — CLONIDINE 0.1 MG/24HR TD PTWK: 0.3000 mg | MEDICATED_PATCH | TRANSDERMAL | 0 refills | Status: DC

## 2019-10-24 NOTE — Assessment & Plan Note (Signed)
HTN: She endorsed brain fog or lightheadedness over the past 2 weeks which she believes is related to her clonidine patch. Her BP will range in the 729'C systolic but with the patch 262'J systolic which is when she is most symptomatic. After removing the patch her symptoms will resolve in 6-10 hours. When her BP drops she feels lightheaded.  She removed the patch this past Sunday due to her symptoms and BP drop. She does not wish to continue using the patch.  Plan: -She will go back to the 0.1 mg/24hr dose patch and discuss this with her cardiologist if she should again increase the dose.  -I suspect she will adjust over time but she may need the 0.2 mg/24hr dose patch

## 2019-10-24 NOTE — Progress Notes (Signed)
  [facility] Internal Medicine Residency Telephone Encounter Continuity Care Appointment  HPI:   This telephone encounter was created for [name] on 10/24/2019 for the following purpose/cc body aches.   Past Medical History:  Past Medical History:  Diagnosis Date  . Anemia   . Anxiety    HX PANIC ATTACKS  . Arthritis    "starting to; in my hands" (07/09/2015)  . Asthma   . Atrial fibrillation ([place])   . Atrial flutter, paroxysmal ([place])   . Bloated abdomen   . CFS (chronic fatigue syndrome)   . Chewing difficulty   . Chronic asthma with acute exacerbation    "I have chronic asthma all the time; sometimes exacerbations" (07/09/2015)  . Chronic lower back pain   . COPD (chronic obstructive pulmonary disease) ([place])   . Cyst of right kidney    "3 of them; dx'd in ~ 01/2015"  . Dyspnea   . GERD (gastroesophageal reflux disease)   . Heart murmur   . History of blood transfusion    "related to my brain surgery I think"  . History of pulmonary embolism 07/09/2015  . HIV antibody positive ([place])   . HIV disease ([place])   . Hyperlipidemia   . Hypertension   . Leg edema   . Lipodystrophy   . Mild CAD 2013  . Osteopenia   . Palpitations   . Pneumonia 07/09/2015  . Shingles   . Sleep apnea    "never completed part 2 of study; never wore mask" (07/09/2015)  . Vitamin B 12 deficiency   . Vitamin D deficiency       ROS:   ROS negative except as per HPI   Assessment / Plan / Recommendations:   Please see A&P under problem oriented charting for assessment of the patient's acute and chronic medical conditions.   As always, pt is advised that if symptoms worsen or new symptoms arise, they should go to an urgent care facility or to to ER for further evaluation.   Consent and Medical Decision Making:   Patient discussed with Dr. Lynnae January  This is a telephone encounter between Shannon [name] and Shannon [name] on 10/24/2019 for body aches. The visit was conducted with  the patient located at home and [employer] at [facility]. The patient's identity was confirmed using their DOB and current address. The patient has consented to being evaluated through a telephone encounter and understands the associated risks (an examination cannot be done and the patient may need to come in for an appointment) / benefits (allows the patient to remain at home, decreasing exposure to coronavirus). I personally spent 16 minutes on medical discussion.

## 2019-10-24 NOTE — Assessment & Plan Note (Addendum)
Body aches: Her BP was 139/76, SpO2 is 93% on 1.5L per [place]. Her most concerning symptoms is her body aches in her arms which began about 2 days prior. These are not associated by night sweats or fever. Temp was 97.0 at home. She has chronic shortness of breath that is slightly worse in so much that she endorses needing her supplemental oxygen for ever greater periods of time aside from nightly use. She is able to speak in full sentences, she denied being out of medication. Has not been eligible for a COVID vaccine. She stated that she has worked hard to avoid exposure to people who might have [place] given the severity of her COPD and did not think it wise to attend jury duty for this reason while she was already feeling ill. She endorsed the muscle aches in her back, arms and legs which prohibited her from going to jury duty today. She would like for her PCP to complete the form exempting her from performing jury duty. She stated that her body aches are related to muscle weakness, inactivity, and excessive medication use.  Plan: -Her current symptoms are not overly indicative of a COVID like infection -Patient has been asked to bring the form to the clinic -I have advised her to return to clinic or contact us again if her dyspnea worsens, she feels weak, develops a fever, cough

## 2019-10-24 NOTE — Progress Notes (Signed)
Internal Medicine Clinic Attending  Case discussed with Dr. Harbrecht at the time of the visit.  We reviewed the resident's history and exam and pertinent patient test results.  I agree with the assessment, diagnosis, and plan of care documented in the resident's note.   

## 2019-10-30 ENCOUNTER — Other Ambulatory Visit: Payer: Self-pay

## 2019-10-30 MED ORDER — BIKTARVY 50-200-25 MG PO TABS: 1.0000 | ORAL_TABLET | Freq: Every day | ORAL | 5 refills | Status: DC

## 2019-11-06 ENCOUNTER — Other Ambulatory Visit: Payer: Medicare Other

## 2019-11-06 ENCOUNTER — Other Ambulatory Visit: Payer: Self-pay | Admitting: *Deleted

## 2019-11-06 DIAGNOSIS — Z113 Encounter for screening for infections with a predominantly sexual mode of transmission: Secondary | ICD-10-CM

## 2019-11-06 DIAGNOSIS — B20 Human immunodeficiency virus [HIV] disease: Secondary | ICD-10-CM

## 2019-11-10 ENCOUNTER — Other Ambulatory Visit: Payer: Self-pay

## 2019-11-10 ENCOUNTER — Other Ambulatory Visit: Payer: Medicare Other

## 2019-11-10 DIAGNOSIS — Z79899 Other long term (current) drug therapy: Secondary | ICD-10-CM

## 2019-11-10 DIAGNOSIS — Z113 Encounter for screening for infections with a predominantly sexual mode of transmission: Secondary | ICD-10-CM

## 2019-11-10 DIAGNOSIS — B20 Human immunodeficiency virus [HIV] disease: Secondary | ICD-10-CM

## 2019-11-10 LAB — T-HELPER CELL (CD4) - (RCID CLINIC ONLY)
CD4 % Helper T Cell: 45 % (ref 33–65)
CD4 T Cell Abs: 669 /uL (ref 400–1790)

## 2019-11-11 LAB — LIPID PANEL
Cholesterol: 249 mg/dL — ABNORMAL HIGH (ref <200)
HDL: 70 mg/dL (ref >=50)
LDL Cholesterol (Calc): 164 mg/dL (calc) — ABNORMAL HIGH
Non-HDL Cholesterol (Calc): 179 mg/dL (calc) — ABNORMAL HIGH (ref <130)
Total CHOL/HDL Ratio: 3.6 (calc) (ref <5.0)
Triglycerides: 53 mg/dL (ref <150)

## 2019-11-14 ENCOUNTER — Other Ambulatory Visit: Payer: Self-pay | Admitting: Internal Medicine

## 2019-11-14 LAB — COMPLETE METABOLIC PANEL WITH GFR
AG Ratio: 1.4 (calc) (ref 1.0–2.5)
ALT: 16 U/L (ref 6–29)
AST: 19 U/L (ref 10–35)
Albumin: 3.9 g/dL (ref 3.6–5.1)
Alkaline phosphatase (APISO): 71 U/L (ref 37–153)
BUN/Creatinine Ratio: 12 (calc) (ref 6–22)
BUN: 16 mg/dL (ref 7–25)
CO2: 32 mmol/L (ref 20–32)
Calcium: 9.7 mg/dL (ref 8.6–10.4)
Chloride: 103 mmol/L (ref 98–110)
Creat: 1.35 mg/dL — ABNORMAL HIGH (ref 0.50–0.99)
GFR, Est African American: 48 mL/min/{1.73_m2} — ABNORMAL LOW (ref >=60)
GFR, Est Non African American: 41 mL/min/{1.73_m2} — ABNORMAL LOW (ref >=60)
Globulin: 2.7 g/dL (calc) (ref 1.9–3.7)
Glucose, Bld: 109 mg/dL — ABNORMAL HIGH (ref 65–99)
Potassium: 4.1 mmol/L (ref 3.5–5.3)
Sodium: 142 mmol/L (ref 135–146)
Total Bilirubin: 0.5 mg/dL (ref 0.2–1.2)
Total Protein: 6.6 g/dL (ref 6.1–8.1)

## 2019-11-14 LAB — CBC WITH DIFFERENTIAL/PLATELET
Absolute Monocytes: 432 cells/uL (ref 200–950)
Basophils Absolute: 32 cells/uL (ref 0–200)
Basophils Relative: 0.6 %
Eosinophils Absolute: 103 cells/uL (ref 15–500)
Eosinophils Relative: 1.9 %
HCT: 37.1 % (ref 35.0–45.0)
Hemoglobin: 12.3 g/dL (ref 11.7–15.5)
Lymphs Abs: 1539 cells/uL (ref 850–3900)
MCH: 30.5 pg (ref 27.0–33.0)
MCHC: 33.2 g/dL (ref 32.0–36.0)
MCV: 92.1 fL (ref 80.0–100.0)
MPV: 11.1 fL (ref 7.5–12.5)
Monocytes Relative: 8 %
Neutro Abs: 3294 cells/uL (ref 1500–7800)
Neutrophils Relative %: 61 %
Platelets: 200 10*3/uL (ref 140–400)
RBC: 4.03 10*6/uL (ref 3.80–5.10)
RDW: 13 % (ref 11.0–15.0)
Total Lymphocyte: 28.5 %
WBC: 5.4 10*3/uL (ref 3.8–10.8)

## 2019-11-14 LAB — RPR: RPR Ser Ql: NONREACTIVE

## 2019-11-14 LAB — HIV-1 RNA QUANT-NO REFLEX-BLD
HIV 1 RNA Quant: <20 copies/mL
HIV-1 RNA Quant, Log: <1.3 Log copies/mL

## 2019-11-15 ENCOUNTER — Other Ambulatory Visit: Payer: Self-pay

## 2019-11-15 ENCOUNTER — Other Ambulatory Visit: Payer: Self-pay | Admitting: Internal Medicine

## 2019-11-15 ENCOUNTER — Ambulatory Visit (HOSPITAL_COMMUNITY): Payer: Medicare Other | Attending: Cardiology

## 2019-11-15 DIAGNOSIS — J441 Chronic obstructive pulmonary disease with (acute) exacerbation: Secondary | ICD-10-CM

## 2019-11-15 DIAGNOSIS — J9611 Chronic respiratory failure with hypoxia: Secondary | ICD-10-CM

## 2019-11-15 DIAGNOSIS — I712 Thoracic aortic aneurysm, without rupture: Secondary | ICD-10-CM | POA: Insufficient documentation

## 2019-11-15 DIAGNOSIS — I7121 Aneurysm of the ascending aorta, without rupture: Secondary | ICD-10-CM

## 2019-11-18 ENCOUNTER — Other Ambulatory Visit: Payer: Self-pay | Admitting: Internal Medicine

## 2019-11-20 ENCOUNTER — Encounter: Payer: Medicare Other | Admitting: Internal Medicine

## 2019-11-23 DIAGNOSIS — J449 Chronic obstructive pulmonary disease, unspecified: Secondary | ICD-10-CM | POA: Diagnosis not present

## 2019-11-23 DIAGNOSIS — R269 Unspecified abnormalities of gait and mobility: Secondary | ICD-10-CM | POA: Diagnosis not present

## 2019-11-23 DIAGNOSIS — G4733 Obstructive sleep apnea (adult) (pediatric): Secondary | ICD-10-CM | POA: Diagnosis not present

## 2019-11-23 DIAGNOSIS — J45909 Unspecified asthma, uncomplicated: Secondary | ICD-10-CM | POA: Diagnosis not present

## 2019-11-25 ENCOUNTER — Ambulatory Visit: Payer: Medicare Other

## 2019-11-28 ENCOUNTER — Other Ambulatory Visit: Payer: Self-pay | Admitting: Internal Medicine

## 2019-11-28 DIAGNOSIS — E538 Deficiency of other specified B group vitamins: Secondary | ICD-10-CM

## 2019-11-29 ENCOUNTER — Encounter: Payer: Medicare Other | Admitting: Internal Medicine

## 2019-11-29 ENCOUNTER — Ambulatory Visit (INDEPENDENT_AMBULATORY_CARE_PROVIDER_SITE_OTHER): Payer: Medicare Other | Admitting: Internal Medicine

## 2019-11-29 ENCOUNTER — Encounter: Payer: Self-pay | Admitting: Internal Medicine

## 2019-11-29 ENCOUNTER — Other Ambulatory Visit: Payer: Self-pay

## 2019-11-29 DIAGNOSIS — J309 Allergic rhinitis, unspecified: Secondary | ICD-10-CM

## 2019-11-29 DIAGNOSIS — B20 Human immunodeficiency virus [HIV] disease: Secondary | ICD-10-CM

## 2019-11-29 MED ORDER — AZITHROMYCIN 250 MG PO TABS: ORAL_TABLET | ORAL | 0 refills | Status: DC

## 2019-11-29 NOTE — Progress Notes (Signed)
Virtual Visit via Telephone Note  I connected with Shannon [name] on 11/29/19 at 11:30 AM EDT by telephone and verified that I am speaking with the correct person using two identifiers.  Location: Patient: at home Provider: in clinic   I discussed the limitations, risks, security and privacy concerns of performing an evaluation and management service by telephone and the availability of in person appointments. I also discussed with the patient that there may be a patient responsible charge related to this service. The patient expressed understanding and agreed to proceed.   History of Present Illness: It is scheduled for 1st dose of vaccine. Sinusitis acting up Has not been outside x 10d   Observations/Objective: Afebrile, reports greenish drainage Sinus pressure causing her headaches for the past 10 days  Assessment and Plan: Will treat for sinusitis- also do humidifier/azithro  alittle nervous to get covid-19 vaccine - reassured her that it will be okay hiv disease= continue on current regimen  Follow Up Instructions:    I discussed the assessment and treatment plan with the patient. The patient was provided an opportunity to ask questions and all were answered. The patient agreed with the plan and demonstrated an understanding of the instructions.   The patient was advised to call back or seek an in-person evaluation if the symptoms worsen or if the condition fails to improve as anticipated.  I provided 20 minutes of non-face-to-face time during this encounter.   [name], MD

## 2019-12-06 ENCOUNTER — Telehealth: Payer: Self-pay | Admitting: Cardiovascular Disease

## 2019-12-06 NOTE — Telephone Encounter (Signed)
Patient wants to turn her 1:40pm appt tomorrow into virtual. She just got the covid vaccine and states the muscles in her arms and legs are sore.

## 2019-12-06 NOTE — Telephone Encounter (Signed)
Left message to call back  

## 2019-12-07 ENCOUNTER — Telehealth (INDEPENDENT_AMBULATORY_CARE_PROVIDER_SITE_OTHER): Payer: Medicare Other | Admitting: Cardiovascular Disease

## 2019-12-07 ENCOUNTER — Encounter: Payer: Self-pay | Admitting: Cardiovascular Disease

## 2019-12-07 VITALS — BP 154/78 | HR 89 | Temp 96.5°F | Ht 63.5 in | Wt 230.0 lb

## 2019-12-07 DIAGNOSIS — E785 Hyperlipidemia, unspecified: Secondary | ICD-10-CM | POA: Diagnosis not present

## 2019-12-07 DIAGNOSIS — I712 Thoracic aortic aneurysm, without rupture: Secondary | ICD-10-CM

## 2019-12-07 DIAGNOSIS — I5032 Chronic diastolic (congestive) heart failure: Secondary | ICD-10-CM | POA: Diagnosis not present

## 2019-12-07 DIAGNOSIS — I483 Typical atrial flutter: Secondary | ICD-10-CM | POA: Diagnosis not present

## 2019-12-07 DIAGNOSIS — I77810 Thoracic aortic ectasia: Secondary | ICD-10-CM

## 2019-12-07 DIAGNOSIS — I352 Nonrheumatic aortic (valve) stenosis with insufficiency: Secondary | ICD-10-CM | POA: Diagnosis not present

## 2019-12-07 DIAGNOSIS — I2510 Atherosclerotic heart disease of native coronary artery without angina pectoris: Secondary | ICD-10-CM

## 2019-12-07 DIAGNOSIS — I739 Peripheral vascular disease, unspecified: Secondary | ICD-10-CM

## 2019-12-07 DIAGNOSIS — I110 Hypertensive heart disease with heart failure: Secondary | ICD-10-CM

## 2019-12-07 DIAGNOSIS — E7800 Pure hypercholesterolemia, unspecified: Secondary | ICD-10-CM

## 2019-12-07 DIAGNOSIS — I10 Essential (primary) hypertension: Secondary | ICD-10-CM

## 2019-12-07 NOTE — Progress Notes (Signed)
Virtual Visit via Phone Note    Evaluation Performed:  Follow-up visit  This visit type was conducted due to national recommendations for restrictions regarding the COVID-19 Pandemic (e.g. social distancing).  This format is felt to be most appropriate for this patient at this time.  All issues noted in this document were discussed and addressed.  No physical exam was performed (except for noted visual exam findings with Video Visits).  Please refer to the patient's chart (MyChart message for video visits and phone note for telephone visits) for the patient's consent to telehealth for [facility].  Date:  12/07/2019   ID:  Shannon [name], DOB October 09, 1955, MRN [id number]  Patient Location:  Home  Provider location:   Office  PCP:  [name], MD  Cardiologist:  Skeet Latch, MD  Electrophysiologist:  None   Chief Complaint:  hypertension  History of Present Illness:    Shannon [name] is a 64 y.o. female with hypertension, paroxysmal atrial flutter, asthma, medication non-compliance, non-obstructive CAD, OSA, prior PE, moderate pulmonary hypertension, mild ascending aorta aneurysm, PAD, and HIV (well-controlled) who presents via video conferencing for a telehealth visit today. Shannon [name] was initially referred by Shannon Basques, MD, on 12/26/15. Shannon [name] was concerned about her lower extremity edema and shortness of breath. She was started on lasix 20 mg every other day. Shannon [name] noted shortness of breath after a hospitalization for pneumonia 06/2015. She denied lower extremity edema or orthopnea but did report chest pain when laying down at night. She was referred for an echo 01/2016 that revealed LVEF 60-65% awith grade 1 diastolic dysfunction and hypokinesis of the basal inferior wall. She also had very mild aortic stenosis with a mean gradient of 9 mmHg and trivial AR. PASP was 52 mmHg. She had a Lexiscan Myoviewat that time that was negative for ischemia. Shannon [name]  previously had a Lexiscan Cardiolite in September 2013 that revealed a medium, mild reversible defect in the anterior wall and normal systolic function. She subsequently underwent cardiac catheterization that revealed nonobstructive coronary disease. She had mild pulmonary hypertension and a normal cardiac output. Left ventriculography revealed mild to moderate aortic regurgitation. She had a 24-hour Holter in February 2015 that revealed rare PACs and PVCs. Echo 01/06/17 revealed LVEF 60-65% with mild LVH and a mean aortic valve gradient of 8 mmHg. Tricuspid regurgitation was not significant enough to assess pulmonary pressures.  Repeat echo 01/2019 revealed LVEF >65%, severe LVH, intracavitary gradient 61 mmHg, mild AS (mean gradient 13 mmHg), mild-moderate AR, and mild ascending aorta aneurysm (4.1 cm).  Shannon [name] was admitted to the hospital 01/25/17 with hypertensive emergency and chest pain. Her blood pressure was 215/100.  She consistently struggles with mediation adherence and has strong feelings about what medicines she should take and when she should take them.  [name] of [facility] there but hasn't been going lately 2/2 COVID-19.  Lisinopril was stopped due to cough and throat swelling.  Shannon [name] reported claudication and had ABIs performed by her PCP, Shannon [name]. The full results are not available, but it was reportedly abnormal.ABI/Dopplers revealed moderate R LE arterial disease and mild L LE disease.  She was referred to our vascular specialists but prefers to keep seeing me.    At her last appointment Shannon [name]'s clonidine was increased to 0.3 mg weekly.  Prior to the vaccine her BP was running in the 130s-140s.  She  decided to stop taking her clonidine for a few days before she got her maximum because she did not know how it would affect her.   She went and had her first COVID vaccine yesterday.  She felt poorly yesterday  but has been better today.  She thinks her BP is higher because she is feeling achy.  She plans to resume her clonidine.  Overall she is feeling well.  She wants to start exercising so that she can be more mobile and travel to [place] for a family reunion.  Since her last appointment she had a repeat echocardiogram 10/2019 that revealed LVEF 60 to 65% with mild LVH and grade 1 diastolic dysfunction.  The ascending aorta was normal in size.  Prior CV studies:   The following studies were reviewed today:  Echo 01/06/17: Study Conclusions  - Left ventricle: The cavity size was normal. Wall thickness was   increased in a pattern of mild LVH. Systolic function was normal.   The estimated ejection fraction was in the range of 60% to 65%.   Wall motion was normal; there were no regional wall motion   abnormalities. Doppler parameters are consistent with abnormal   left ventricular relaxation (grade 1 diastolic dysfunction). - Aortic valve: Trileaflet; moderately calcified leaflets.   Sclerosis without stenosis. There was mild to moderate   regurgitation. Mean gradient (S): 8 mm Hg. - Mitral valve: There was no significant regurgitation. - Left atrium: The atrium was moderately dilated. - Right ventricle: The cavity size was normal. Systolic function   was normal. - Right atrium: The atrium was mildly dilated. - Pulmonary arteries: No complete TR doppler jet so unable to   estimate PA systolic pressure. - Systemic veins: IVC measured 2.2 cm with > 50% respirophasic   variation, suggesting RA pressure 8 mmHg.  Impressions:  - Normal LV size with mild LV hypertrophy. EF 60-65%. Calcified,   trileaflet aortic valve. Sclerosis without significant stenosis,   mild to moderate aortic insufficiency. Normal RV size and   systolic function. Biatrial enlargement.  Echo 11/15/19: IMPRESSIONS    1. Left ventricular ejection fraction, by estimation, is 60 to 65%. The  left ventricle has normal  function. The left ventricle has no regional  wall motion abnormalities. There is mild left ventricular hypertrophy.  Left ventricular diastolic parameters  are consistent with Grade I diastolic dysfunction (impaired relaxation).  2. Right ventricular systolic function is normal. The right ventricular  size is normal. There is normal pulmonary artery systolic pressure. The  estimated right ventricular systolic pressure is 37.6 mmHg.  3. The mitral valve is degenerative. No evidence of mitral valve  regurgitation. No evidence of mitral stenosis. There is turbulence across  the mitral valve but it does not appear restricted and there is no  evidence for a significant pressure gradient  across it.  4. The aortic valve is tricuspid. Aortic valve regurgitation is trivial.  Mild aortic valve sclerosis is present, with no evidence of aortic valve  stenosis.  5. The inferior vena cava is normal in size with greater than 50%  respiratory variability, suggesting right atrial pressure of 3 mmHg.   Lower extremity Doppler 03/09/19: Tibial vessel disease; one vessel run-off via peroneal artery.    Summary:  Right: Near normal examination. No hemodynamically significant arterial  stenosis/occlusion noted in the CFA, DFA, SFA, popliteal artery and TPT.   Tibial vessel disease; zero vessel run-off.   Left: Near normal examination. No hemodynamically significant arterial  stenosis/occlusion noted  in the CFA, DFA, SFA, popliteal artery and TPT.   Tibial vessel disease; one vessel run-off via peroneal artery.   Past Medical History:  Diagnosis Date  . Anemia   . Anxiety    HX PANIC ATTACKS  . Arthritis    "starting to; in my hands" (07/09/2015)  . Asthma   . Atrial fibrillation ([place])   . Atrial flutter, paroxysmal ([place])   . Bloated abdomen   . CFS (chronic fatigue syndrome)   . Chewing difficulty   . Chronic asthma with acute exacerbation    "I have chronic asthma all the time;  sometimes exacerbations" (07/09/2015)  . Chronic lower back pain   . COPD (chronic obstructive pulmonary disease) ([place])   . Cyst of right kidney    "3 of them; dx'd in ~ 01/2015"  . Dyspnea   . GERD (gastroesophageal reflux disease)   . Heart murmur   . History of blood transfusion    "related to my brain surgery I think"  . History of pulmonary embolism 07/09/2015  . HIV antibody positive ([place])   . HIV disease ([place])   . Hyperlipidemia   . Hypertension   . Leg edema   . Lipodystrophy   . Mild CAD 2013  . Osteopenia   . Palpitations   . Pneumonia 07/09/2015  . Shingles   . Sleep apnea    "never completed part 2 of study; never wore mask" (07/09/2015)  . Vitamin B 12 deficiency   . Vitamin D deficiency    Past Surgical History:  Procedure Laterality Date  . ABDOMINAL HYSTERECTOMY     "robotic laparosopic"  . BRAIN SURGERY  1974   "brain tumor; benign; on top of my brain; got a plate in there"  . CARDIAC CATHETERIZATION    . TONSILLECTOMY AND ADENOIDECTOMY       Current Meds  Medication Sig  . acetaminophen (TYLENOL) 325 MG tablet Take 325 mg by mouth every 6 (six) hours as needed (FOR PAIN).  [name] albuterol (PROVENTIL) (2.5 MG/3ML) 0.083% nebulizer solution USE 1 VIAL VIA NEBULIZER EVERY 6 HOURS AS NEEDED FOR WHEEZING OR SHORTNESS OF BREATH  . Ascorbic Acid (VITAMIN C) 1000 MG tablet Take 1,000 mg by mouth 2 (two) times a week.   . bictegravir-emtricitabine-tenofovir AF (BIKTARVY) 50-200-25 MG TABS tablet Take 1 tablet by mouth daily.  [name] BIOTIN PO Take 1 tablet by mouth daily as needed (for supplementation).   . Cholecalciferol (SM VITAMIN D3) 100 MCG (4000 UT) CAPS Take 1 capsule (4,000 Units total) by mouth daily.  . cloNIDine (CATAPRES - DOSED IN MG/24 HR) 0.1 mg/24hr patch Place 3 patches (0.3 mg total) onto the skin once a week.  . cyanocobalamin (,VITAMIN B-12,) 1000 MCG/ML injection ADMINISTER 1 ML(1000 MCG) IN THE MUSCLE EVERY 30 DAYS  . diclofenac sodium (VOLTAREN) 1  % GEL Apply 2 g topically 3 (three) times daily as needed (arm pain).  [name] diltiazem (CARDIZEM CD) 240 MG 24 hr capsule TAKE 1 CAPSULE(240 MG) BY MOUTH DAILY  . fluticasone (FLONASE) 50 MCG/ACT nasal spray Place 2 sprays into both nostrils daily. (Patient taking differently: Place 2 sprays into both nostrils daily as needed for allergies. )  . MAGNESIUM PO Take by mouth every other day.  . Melatonin 5 MG TABS Take 5 mg by mouth at bedtime as needed (for sleep).  . Misc. Devices (PULSE OXIMETER FOR FINGER) MISC 1 Units by Does not apply route as needed.  . montelukast (SINGULAIR) 10 MG tablet Take  1 tablet (10 mg total) by mouth at bedtime. (Patient taking differently: Take 10 mg by mouth daily as needed (allergies). )  . Mouthwashes (BIOTENE DRY MOUTH GENTLE) LIQD Use as directed 1 Dose in the mouth or throat 2 (two) times daily.  [name] omeprazole (PRILOSEC) 40 MG capsule TAKE 1 CAPSULE(40 MG) BY MOUTH DAILY  . OXYGEN Inhale 1.5-2 L into the lungs continuous.   . polyethylene glycol (MIRALAX / GLYCOLAX) packet Take 17 g by mouth daily. (Patient taking differently: Take 17 g by mouth daily as needed for mild constipation. )  . PREVIDENT 5000 SENSITIVE 1.1-5 % PSTE Use daily as directed  . PROAIR HFA 108 (90 Base) MCG/ACT inhaler INHALE 2 PUFFS INTO THE LUNGS EVERY 4 HOURS AS NEEDED FOR WHEEZING OR SHORTNESS OF BREATH  . senna-docusate (SENOKOT-S) 8.6-50 MG tablet Take 1 tablet by mouth at bedtime as needed for mild constipation.  . SYRINGE/NEEDLE, DISP, 1 ML (B-D SYRINGE/NEEDLE 1CC/25GX5/8) 25G X 5/8" 1 ML MISC 1 Units by Does not apply route every 30 (thirty) days.  Alveda Reasons 20 MG TABS tablet TAKE 1 TABLET(20 MG) BY MOUTH DAILY WITH SUPPER  . [DISCONTINUED] amoxicillin (AMOXIL) 500 MG capsule TAKE 1 CAPSULE(500 MG) BY MOUTH THREE TIMES DAILY FOR 10 DAYS  . [DISCONTINUED] azelastine (OPTIVAR) 0.05 % ophthalmic solution Place 2 drops into both eyes 2 (two) times daily as needed (for swelling or itching).    . [DISCONTINUED] azithromycin (ZITHROMAX) 250 MG tablet Take 2 tabs on first day, then 1 tab daily until complete     Allergies:   Tree extract, Augmentin [amoxicillin-pot clavulanate], Lisinopril, and Ciprofloxacin   Social History   Tobacco Use  . Smoking status: Never Smoker  . Smokeless tobacco: Never Used  Substance Use Topics  . Alcohol use: Yes    Alcohol/week: 0.0 standard drinks    Comment: Rarely.  . Drug use: No     Family Hx: The patient's family history includes Asthma in her mother; Diabetes in her sister; Heart disease in her mother; Heart failure in her mother; Heart murmur in her brother, sister, and sister; Hypertension in her mother; Obesity in her mother; Thyroid disease in her mother and sister.  ROS:   Please see the history of present illness.     All other systems reviewed and are negative.   Labs/Other Tests and Data Reviewed:    Recent Labs: 05/26/2019: B Natriuretic Peptide 58.4; TSH 0.673 11/10/2019: ALT 16; BUN 16; Creat 1.35; Hemoglobin 12.3; Platelets 200; Potassium 4.1; Sodium 142   Recent Lipid Panel Lab Results  Component Value Date/Time   CHOL 249 (H) 11/10/2019 10:55 AM   CHOL 253 (H) 05/03/2018 10:43 AM   TRIG 53 11/10/2019 10:55 AM   HDL 70 11/10/2019 10:55 AM   HDL 86 05/03/2018 10:43 AM   CHOLHDL 3.6 11/10/2019 10:55 AM   LDLCALC 164 (H) 11/10/2019 10:55 AM    Wt Readings from Last 3 Encounters:  12/07/19 230 lb (104.3 kg)  10/05/19 228 lb (103.4 kg)  10/03/19 230 lb 8 oz (104.6 kg)     Objective:    VS:  BP (!) 154/78   Pulse 89   Temp (!) 96.5 F (35.8 C)   Ht 5' 3.5" (1.613 m)   Wt 230 lb (104.3 kg)   SpO2 93%   BMI 40.10 kg/m  , BMI Body mass index is 40.1 kg/m. GENERAL:  Sounds well. LUNGS:  Respirations unabored NEURO:  Speech fluent PSYCH:  Cognitively intact, oriented to  person place and time   ASSESSMENT & PLAN:    # Hypertension:BP is much better but not at goal. She has not been taking her  clonidine patch regularly, though she does seem to like it.  She is going to start back using it each week.  Continue clonidine, diltiazem, spironolactone, and Lasix.   # Aortic regurgitation # Aortic stenosis: Repeat echo 10/2019 was negative for aortic stenosis and ascending aortic aneurysm. Both echos were reviewed.  It appears to be very mild, on the border between aortic stenosis and sclerosis.  # Hyperlipidemia:Lipids are elevated.  Check fasting lipids/CMP when she is able to come to the office.   # Atrial flutter: Converted with flecainide. Continue diltiazem and Xarelto.   # Ascending aorta aneurysm: 4.0 on CT 12/2016.It was unchanged on MRA 10/2018.  4.1 cm on echo 01/2019.  Will repeat 01/2020.  BP control as above.   # PAD: Lipid control as above.  Will discuss again to see if she will see Dr. Gwenlyn Found or Fletcher Anon.   COVID-19 Education: The signs and symptoms of COVID-19 were discussed with the patient and how to seek care for testing (follow up with PCP or arrange E-visit).  The importance of social distancing was discussed today.  Patient Risk:   After full review of this patient's clinical status, I feel that they are at least moderate risk at this time.  Time:   Today, I have spent 65minutes with the patient with telehealth technology discussing hypertension.   Medication Adjustments/Labs and Tests Ordered: Current medicines are reviewed at length with the patient today.  Concerns regarding medicines are outlined above.   Tests Ordered: No orders of the defined types were placed in this encounter.  Medication Changes: No orders of the defined types were placed in this encounter.   Disposition:  Follow up in 2 month(s)  Signed, Skeet Latch, MD  12/07/2019 5:58 PM    [place]

## 2019-12-07 NOTE — Patient Instructions (Addendum)
Medication Instructions:  Your physician recommends that you continue on your current medications as directed. Please refer to the Current Medication list given to you today.  *If you need a refill on your cardiac medications before your next appointment, please call your pharmacy*  Lab Work: FASTING LP/CMET PRIOR TO YOUR VISIT   If you have labs (blood work) drawn today and your tests are completely normal, you will receive your results only by: [name] MyChart Message (if you have MyChart) OR . A paper copy in the mail If you have any lab test that is abnormal or we need to change your treatment, we will call you to review the results.  Testing/Procedures: NONE   Follow-Up: At [facility], you and your health needs are our priority.  As part of our continuing mission to provide you with exceptional heart care, we have created designated Provider Care Teams.  These Care Teams include your primary Cardiologist (physician) and Advanced Practice Providers (APPs -  Physician Assistants and Nurse Practitioners) who all work together to provide you with the care you need, when you need it.  We recommend signing up for the patient portal called "MyChart".  Sign up information is provided on this After Visit Summary.  MyChart is used to connect with patients for Virtual Visits (Telemedicine).  Patients are able to view lab/test results, encounter notes, upcoming appointments, etc.  Non-urgent messages can be sent to your provider as well.   To learn more about what you can do with MyChart, go to [email or web address].    Your next appointment:   3 month(s)   You will receive a reminder letter in the mail two months in advance. If you don't receive a letter, please call our office to schedule the follow-up appointment.  The format for your next appointment:   In Person  Provider:   You may see Skeet Latch, MD or one of the following Advanced Practice Providers on your designated Care Team:     [name], PA-C  [place], [place]  Coletta Memos, 

## 2019-12-12 NOTE — Telephone Encounter (Signed)
Patient had virtual visit, was told next visit will need to be in person

## 2019-12-20 ENCOUNTER — Other Ambulatory Visit: Payer: Self-pay | Admitting: Internal Medicine

## 2019-12-28 ENCOUNTER — Telehealth: Payer: Self-pay | Admitting: Cardiovascular Disease

## 2019-12-28 NOTE — Telephone Encounter (Signed)
Patient states that she thinks she has found out why her BP is so high. She states that her teeth are infected at the root and will have to have several removed. She has some paperwork to get permission and would like to speak with Dr. Blenda Mounts nurse in regards to this. Please advise.

## 2019-12-28 NOTE — Telephone Encounter (Signed)
Spoke with patient who reports she needs dental extractions due to infection - asked that she send clearance form  - she states her BP is high b/c of dental infection  She had second COVID vaccine on 12/26/2019 - arm is sore  She reports BP is going up and down  She is asking to be cleared for extractions. Asked that she take a picture of the clearance form, send as an attachment to MyChart message, and then we can turn into a phone note and it will be addressed by pre-op team

## 2019-12-29 ENCOUNTER — Encounter: Payer: Self-pay | Admitting: Adult Health

## 2019-12-29 ENCOUNTER — Other Ambulatory Visit: Payer: Self-pay

## 2019-12-29 ENCOUNTER — Ambulatory Visit (INDEPENDENT_AMBULATORY_CARE_PROVIDER_SITE_OTHER): Payer: Medicare Other | Admitting: Adult Health

## 2019-12-29 DIAGNOSIS — E669 Obesity, unspecified: Secondary | ICD-10-CM

## 2019-12-29 DIAGNOSIS — J449 Chronic obstructive pulmonary disease, unspecified: Secondary | ICD-10-CM | POA: Diagnosis not present

## 2019-12-29 DIAGNOSIS — J4489 Other specified chronic obstructive pulmonary disease: Secondary | ICD-10-CM

## 2019-12-29 MED ORDER — FLUTICASONE FUROATE-VILANTEROL 200-25 MCG/INH IN AEPB: 1.0000 | INHALATION_SPRAY | Freq: Every day | RESPIRATORY_TRACT | 5 refills | Status: DC

## 2019-12-29 NOTE — Progress Notes (Signed)
@Patient  ID: Shannon [name], female    DOB: 01-31-1956, 64 y.o.   MRN: [id number]  Chief Complaint  Patient presents with  . Follow-up    Asthma     Referring provider: [name], MD  HPI: 64 year old female never smoker followed for severe persistent asthma, nocturnal hypoxemia on oxygen at 2 L at bedtime allergic rhinitis Medical history significant for HIV followed at the ID clinic, lipodystrophy and a flutter on Xarelto  TEST/EVENTS :  CTa chest 12/2016 , 01/2017 >neg for PE , multifocal scarring bilaterally PFT (12/04/15) FEV1/FVC 50%, FEV1 0.64 31%.+++BD response. DLCO 66%. Spirometry 09/2017-ratio 59, FEV1 24%, FVC 32% Spirometry 05/2018 ratio 55, FEV1 28%, FVC 38%  Sleep study in 04/2016 was (-) for OSA  echo 01/2016 that revealed LVEF 60-65% awith grade 1 diastolic dysfunction and hypokinesis of the basal inferior wall. She also had very mild aortic stenosis with a mean gradient of 9 mmHg and trivial AR. PASP was 52 mmHg. She had a Lexiscan Myoviewat that time that was negative for ischemia  Repeat echo 01/2019 revealed LVEF >65%, severe LVH, intracavitary gradient 61 mmHg, mild AS (mean gradient 13 mmHg), mild-moderate AR, and mild ascending aorta aneurysm (4.1 cm).  10/2019 that revealed LVEF 60 to 65% with mild LVH and grade 1 diastolic dysfunction.  The ascending aorta was normal in size.  12/29/2019 Follow up : Asthma  Patient returns for a follow-up visit.  She was last seen February 2020.  Says overall her breathing is doing about the same she gets short of breath with minimum activity.  Is not very active due to dyspnea.  She has multiple questions regarding her underlying asthma.  Patient was educated that she has severe chronic obstructive asthma with recent spirometry in October 2019 showing a very low FEV1 at 28%.  Ratio at 55.  Long discussion with patient regarding her underlying diagnosis.  She also has multiple comorbidities along with physical  deconditioning that are most likely contributing to her dyspnea.  Patient is supposed to be on Breo daily.  Says that she has not been taking this.  She says she does not really like taking inhalers.  Discussed with her the importance of medication compliance and that her underlying asthma is not being treated. She denies any increased wheezing or cough currently.  She says she is working with a Buyer, retail right now regarding her dental issues.  And needs several teeth removed.  I have suggested that she discuss this with her primary care provider and cardiologist as she is on multiple medications including Xarelto.  Following with cardiology with difficult to control hypertension recently started on clonidine patch   Allergies  Allergen Reactions  . Tree Extract Swelling and Other (See Comments)    Swelling to eyes  . Augmentin [Amoxicillin-Pot Clavulanate] Other (See Comments)    Headache, dizzy  . Lisinopril Cough    Face/throat swelling  . Ciprofloxacin Hives    Immunization History  Administered Date(s) Administered  . PFIZER SARS-COV-2 Vaccination 12/05/2019, 12/26/2019    Past Medical History:  Diagnosis Date  . Anemia   . Anxiety    HX PANIC ATTACKS  . Arthritis    "starting to; in my hands" (07/09/2015)  . Asthma   . Atrial fibrillation ([place])   . Atrial flutter, paroxysmal ([place])   . Bloated abdomen   . CFS (chronic fatigue syndrome)   . Chewing difficulty   . Chronic asthma with acute exacerbation    "I  have chronic asthma all the time; sometimes exacerbations" (07/09/2015)  . Chronic lower back pain   . COPD (chronic obstructive pulmonary disease) ([place])   . Cyst of right kidney    "3 of them; dx'd in ~ 01/2015"  . Dyspnea   . GERD (gastroesophageal reflux disease)   . Heart murmur   . History of blood transfusion    "related to my brain surgery I think"  . History of pulmonary embolism 07/09/2015  . HIV antibody positive ([place])   . HIV disease ([place])   .  Hyperlipidemia   . Hypertension   . Leg edema   . Lipodystrophy   . Mild CAD 2013  . Osteopenia   . Palpitations   . Pneumonia 07/09/2015  . Shingles   . Sleep apnea    "never completed part 2 of study; never wore mask" (07/09/2015)  . Vitamin B 12 deficiency   . Vitamin D deficiency     Tobacco History: Social History   Tobacco Use  Smoking Status Never Smoker  Smokeless Tobacco Never Used   Counseling given: Not Answered   Outpatient Medications Prior to Visit  Medication Sig Dispense Refill  . acetaminophen (TYLENOL) 325 MG tablet Take 325 mg by mouth every 6 (six) hours as needed (FOR PAIN).    [name] albuterol (PROVENTIL) (2.5 MG/3ML) 0.083% nebulizer solution USE 1 VIAL VIA NEBULIZER EVERY 6 HOURS AS NEEDED FOR WHEEZING OR SHORTNESS OF BREATH 75 mL 6  . Ascorbic Acid (VITAMIN C) 1000 MG tablet Take 1,000 mg by mouth 2 (two) times a week.     . bictegravir-emtricitabine-tenofovir AF (BIKTARVY) 50-200-25 MG TABS tablet Take 1 tablet by mouth daily. 30 tablet 5  . BIOTIN PO Take 1 tablet by mouth daily as needed (for supplementation).     . Cholecalciferol (SM VITAMIN D3) 100 MCG (4000 UT) CAPS Take 1 capsule (4,000 Units total) by mouth daily. 30 capsule 0  . cloNIDine (CATAPRES - DOSED IN MG/24 HR) 0.1 mg/24hr patch Place 3 patches (0.3 mg total) onto the skin once a week. 4 patch 0  . cyanocobalamin (,VITAMIN B-12,) 1000 MCG/ML injection ADMINISTER 1 ML(1000 MCG) IN THE MUSCLE EVERY 30 DAYS 1 mL 3  . diclofenac sodium (VOLTAREN) 1 % GEL Apply 2 g topically 3 (three) times daily as needed (arm pain). 50 g 0  . diltiazem (CARDIZEM CD) 240 MG 24 hr capsule TAKE 1 CAPSULE(240 MG) BY MOUTH DAILY 90 capsule 3  . fluticasone (FLONASE) 50 MCG/ACT nasal spray Place 2 sprays into both nostrils daily. (Patient taking differently: Place 2 sprays into both nostrils daily as needed for allergies. ) 16 g 2  . MAGNESIUM PO Take by mouth every other day.    . Melatonin 5 MG TABS Take 5 mg by  mouth at bedtime as needed (for sleep).    . Misc. Devices (PULSE OXIMETER FOR FINGER) MISC 1 Units by Does not apply route as needed. 1 each 0  . montelukast (SINGULAIR) 10 MG tablet Take 1 tablet (10 mg total) by mouth at bedtime. (Patient taking differently: Take 10 mg by mouth daily as needed (allergies). ) 30 tablet 2  . Mouthwashes (BIOTENE DRY MOUTH GENTLE) LIQD Use as directed 1 Dose in the mouth or throat 2 (two) times daily. 1 Bottle 0  . omeprazole (PRILOSEC) 40 MG capsule TAKE 1 CAPSULE(40 MG) BY MOUTH DAILY 90 capsule 1  . OXYGEN Inhale 1.5-2 L into the lungs continuous.     . polyethylene glycol (  MIRALAX / GLYCOLAX) packet Take 17 g by mouth daily. (Patient taking differently: Take 17 g by mouth daily as needed for mild constipation. ) 14 each 0  . PREVIDENT 5000 SENSITIVE 1.1-5 % PSTE Use daily as directed  1  . PROAIR HFA 108 (90 Base) MCG/ACT inhaler INHALE 2 PUFFS INTO THE LUNGS EVERY 4 HOURS AS NEEDED FOR WHEEZING OR SHORTNESS OF BREATH 17 g 6  . senna-docusate (SENOKOT-S) 8.6-50 MG tablet Take 1 tablet by mouth at bedtime as needed for mild constipation. 30 tablet 0  . SYRINGE/NEEDLE, DISP, 1 ML (B-D SYRINGE/NEEDLE 1CC/25GX5/8) 25G X 5/8" 1 ML MISC 1 Units by Does not apply route every 30 (thirty) days. 50 each 0  . XARELTO 20 MG TABS tablet TAKE 1 TABLET(20 MG) BY MOUTH DAILY WITH SUPPER 90 tablet 1  . furosemide (LASIX) 40 MG tablet Take 1 tablet (40 mg total) by mouth daily. 90 tablet 3  . spironolactone (ALDACTONE) 25 MG tablet Take 1 tablet (25 mg total) by mouth daily. 90 tablet 1   No facility-administered medications prior to visit.     Review of Systems:   Constitutional:   No  weight loss, night sweats,  Fevers, chills,  +fatigue, or  lassitude.  HEENT:   No headaches,  Difficulty swallowing,  Tooth/dental problems, or  Sore throat,                No sneezing, itching, ear ache, nasal congestion, post nasal drip,   CV:  No chest pain,  Orthopnea, PND, swelling  in lower extremities, anasarca, dizziness, palpitations, syncope.   GI  No heartburn, indigestion, abdominal pain, nausea, vomiting, diarrhea, change in bowel habits, loss of appetite, bloody stools.   Resp:    No chest wall deformity  Skin: no rash or lesions.  GU: no dysuria, change in color of urine, no urgency or frequency.  No flank pain, no hematuria   MS:  No joint pain or swelling.  No decreased range of motion.  No back pain.    Physical Exam  BP (!) 170/104 (BP Location: Right Arm, Cuff Size: Normal)   Pulse (!) 101   Ht 5\' 3"  (1.6 m)   Wt 235 lb (106.6 kg)   SpO2 94%   BMI 41.63 kg/m   GEN: A/Ox3; pleasant , NAD, BMI 41   HEENT:  /AT,    NOSE-clear, THROAT-clear, no lesions, no postnasal drip or exudate noted.   NECK:  Supple w/ fair ROM; no JVD; normal carotid impulses w/o bruits; no thyromegaly or nodules palpated; no lymphadenopathy.    RESP  Clear  P & A; w/o, wheezes/ rales/ or rhonchi. no accessory muscle use, no dullness to percussion  CARD:  RRR, no m/r/g, tr  peripheral edema, pulses intact, no cyanosis or clubbing.  GI:   Soft & nt; nml bowel sounds; no organomegaly or masses detected.   Musco: Warm bil, no deformities or joint swelling noted.   Neuro: alert, no focal deficits noted.    Skin: Warm, no lesions or rashes    Lab Results:  CBC    Component Value Date/Time   WBC 5.4 11/10/2019 1027   RBC 4.03 11/10/2019 1027   HGB 12.3 11/10/2019 1027   HGB 12.3 02/23/2019 1100   HCT 37.1 11/10/2019 1027   HCT 36.3 02/23/2019 1100   PLT 200 11/10/2019 1027   PLT 181 02/23/2019 1100   MCV 92.1 11/10/2019 1027   MCV 89 02/23/2019 1100   MCH 30.5  11/10/2019 1027   MCHC 33.2 11/10/2019 1027   RDW 13.0 11/10/2019 1027   RDW 13.3 02/23/2019 1100   LYMPHSABS 1,539 11/10/2019 1027   LYMPHSABS 1.2 06/29/2018 0942   MONOABS 0.4 02/03/2019 0813   EOSABS 103 11/10/2019 1027   EOSABS 0.1 06/29/2018 0942   BASOSABS 32 11/10/2019 1027   BASOSABS  0.0 06/29/2018 0942    BMET    Component Value Date/Time   NA 142 11/10/2019 1027   NA 145 (H) 02/23/2019 1100   K 4.1 11/10/2019 1027   CL 103 11/10/2019 1027   CO2 32 11/10/2019 1027   GLUCOSE 109 (H) 11/10/2019 1027   BUN 16 11/10/2019 1027   BUN 17 02/23/2019 1100   CREATININE 1.35 (H) 11/10/2019 1027   CALCIUM 9.7 11/10/2019 1027   GFRNONAA 41 (L) 11/10/2019 1027   GFRAA 48 (L) 11/10/2019 1027    BNP    Component Value Date/Time   BNP 58.4 05/26/2019 1044   BNP 26 11/03/2018 1430    ProBNP No results found for: PROBNP  Imaging: No results found.    PFT Results Latest Ref Rng & Units 12/04/2015  FVC-Predicted Pre % 49  FVC-Post L 1.45  FVC-Predicted Post % 55  Pre FEV1/FVC % % 50  Post FEV1/FCV % % 55  FEV1-Pre L 0.64  FEV1-Predicted Pre % 31  FEV1-Post L 0.80  DLCO UNC% % 66  DLCO COR %Predicted % 109    No results found for: NITRICOXIDE      Assessment & Plan:   Chronic obstructive asthma (HCC) Severe chronic obstructive asthma-patient has very low pulmonary reserve.  Have discussed with her her underlying diagnosis and the importance of medication compliance.  Would like to for her to restart Breo.  See if this helps with her high symptom burden.  Plan  Patient Instructions  Restart BREO 200 daily , rinse after use.  Albuterol Inhaler /  Neb every 4-6 hr as needed wheezing .  Activity as tolerated.  Follow up with Dr. Elsworth Soho  In 2 months or Sharaya Boruff NP .  Please contact office for sooner follow up if symptoms do not improve or worsen or seek emergency care       Obesity (BMI 30-39.9) Work on healthy weight loss    Total patient care time 35 minutes  Rexene Edison, NP 12/29/2019

## 2019-12-29 NOTE — Patient Instructions (Addendum)
Restart BREO 200 daily , rinse after use.  Albuterol Inhaler /  Neb every 4-6 hr as needed wheezing .  Activity as tolerated.  Follow up with Dr. Elsworth Soho  In 2 months or Chasey Dull NP .  Please contact office for sooner follow up if symptoms do not improve or worsen or seek emergency care

## 2019-12-29 NOTE — Assessment & Plan Note (Signed)
Work on healthy weight loss 

## 2019-12-29 NOTE — Assessment & Plan Note (Signed)
Severe chronic obstructive asthma-patient has very low pulmonary reserve.  Have discussed with her her underlying diagnosis and the importance of medication compliance.  Would like to for her to restart Breo.  See if this helps with her high symptom burden.  Plan  Patient Instructions  Restart BREO 200 daily , rinse after use.  Albuterol Inhaler /  Neb every 4-6 hr as needed wheezing .  Activity as tolerated.  Follow up with Dr. Elsworth Soho  In 2 months or Larya Charpentier NP .  Please contact office for sooner follow up if symptoms do not improve or worsen or seek emergency care

## 2020-01-04 ENCOUNTER — Ambulatory Visit (HOSPITAL_COMMUNITY)
Admission: RE | Admit: 2020-01-04 | Discharge: 2020-01-04 | Disposition: A | Discharge status: Home / Self Care | Payer: Medicare Other | Source: Ambulatory Visit | Attending: Internal Medicine | Admitting: Internal Medicine

## 2020-01-04 ENCOUNTER — Other Ambulatory Visit: Payer: Self-pay

## 2020-01-04 ENCOUNTER — Encounter: Payer: Self-pay | Admitting: Internal Medicine

## 2020-01-04 ENCOUNTER — Ambulatory Visit (INDEPENDENT_AMBULATORY_CARE_PROVIDER_SITE_OTHER): Payer: Medicare Other | Admitting: Internal Medicine

## 2020-01-04 VITALS — BP 188/93 | HR 78 | Temp 98.2°F | Ht 63.0 in | Wt 233.2 lb

## 2020-01-04 DIAGNOSIS — I5032 Chronic diastolic (congestive) heart failure: Secondary | ICD-10-CM

## 2020-01-04 DIAGNOSIS — R911 Solitary pulmonary nodule: Secondary | ICD-10-CM

## 2020-01-04 DIAGNOSIS — I483 Typical atrial flutter: Secondary | ICD-10-CM

## 2020-01-04 DIAGNOSIS — I10 Essential (primary) hypertension: Secondary | ICD-10-CM

## 2020-01-04 DIAGNOSIS — R600 Localized edema: Secondary | ICD-10-CM | POA: Diagnosis not present

## 2020-01-04 DIAGNOSIS — R0602 Shortness of breath: Secondary | ICD-10-CM | POA: Diagnosis not present

## 2020-01-04 DIAGNOSIS — R918 Other nonspecific abnormal finding of lung field: Secondary | ICD-10-CM | POA: Diagnosis not present

## 2020-01-04 DIAGNOSIS — I509 Heart failure, unspecified: Secondary | ICD-10-CM | POA: Diagnosis not present

## 2020-01-04 DIAGNOSIS — Z7901 Long term (current) use of anticoagulants: Secondary | ICD-10-CM | POA: Diagnosis not present

## 2020-01-04 DIAGNOSIS — C3490 Malignant neoplasm of unspecified part of unspecified bronchus or lung: Secondary | ICD-10-CM | POA: Insufficient documentation

## 2020-01-04 LAB — BASIC METABOLIC PANEL
Anion gap: 7 (ref 5–15)
BUN: 18 mg/dL (ref 8–23)
CO2: 34 mmol/L — ABNORMAL HIGH (ref 22–32)
Calcium: 10.4 mg/dL — ABNORMAL HIGH (ref 8.9–10.3)
Chloride: 100 mmol/L (ref 98–111)
Creatinine, Ser: 1.37 mg/dL — ABNORMAL HIGH (ref 0.44–1.00)
GFR calc Af Amer: 47 mL/min — ABNORMAL LOW (ref >60)
GFR calc non Af Amer: 40 mL/min — ABNORMAL LOW (ref >60)
Glucose, Bld: 115 mg/dL — ABNORMAL HIGH (ref 70–99)
Potassium: 4.4 mmol/L (ref 3.5–5.1)
Sodium: 141 mmol/L (ref 135–145)

## 2020-01-04 LAB — BRAIN NATRIURETIC PEPTIDE: B Natriuretic Peptide: 63.8 pg/mL (ref 0.0–100.0)

## 2020-01-04 MED ORDER — FUROSEMIDE 40 MG PO TABS: 40.0000 mg | ORAL_TABLET | Freq: Every day | ORAL | 0 refills | Status: DC

## 2020-01-04 NOTE — Patient Instructions (Signed)
Thank you, [name] for allowing [place] to provide your care today. Today we discussed Heart Failure.    I have ordered BMP, BNP labs for you. I will call if any are abnormal.    I have place a referrals to none.   I have ordered the following tests: Chest XR   I have ordered the following medication/changed the following medications:  - Please restart your Furosemide 40 mg daily.    Please follow-up in 1 week.    Should you have any questions or concerns please call the internal medicine clinic at [phone].    Marianna Payment, D.O. [facility]

## 2020-01-04 NOTE — Assessment & Plan Note (Signed)
Patient presented with symptoms of CHF exacerbation.  I counseled her on her symptoms and the importance of taking her furosemide.  Metoprolol we will reevaluate her symptoms in 1 week after consistently taking her furosemide 40 mg daily.  -Reevaluate in 1 week.

## 2020-01-04 NOTE — Assessment & Plan Note (Signed)
Patient tolerating her Xarelto and states that she consistently takes it as prescribed.  She will likely need to stop this medication prior to her dental procedures in the near future.

## 2020-01-04 NOTE — Progress Notes (Signed)
   CC: Shortness of breath  HPI:  [name] is a 64 y.o. female with a past medical history stated below and presents today for shortness of breath and medical clearance for dental extraction. Please see problem based assessment and plan for additional details.  Past Medical History:  Diagnosis Date  . Anemia   . Anxiety    HX PANIC ATTACKS  . Arthritis    "starting to; in my hands" (07/09/2015)  . Asthma   . Atrial fibrillation ([place])   . Atrial flutter, paroxysmal ([place])   . Bloated abdomen   . CFS (chronic fatigue syndrome)   . Chewing difficulty   . Chronic asthma with acute exacerbation    "I have chronic asthma all the time; sometimes exacerbations" (07/09/2015)  . Chronic lower back pain   . COPD (chronic obstructive pulmonary disease) ([place])   . Cyst of right kidney    "3 of them; dx'd in ~ 01/2015"  . Dyspnea   . GERD (gastroesophageal reflux disease)   . Heart murmur   . History of blood transfusion    "related to my brain surgery I think"  . History of pulmonary embolism 07/09/2015  . HIV antibody positive ([place])   . HIV disease ([place])   . Hyperlipidemia   . Hypertension   . Leg edema   . Lipodystrophy   . Mild CAD 2013  . Osteopenia   . Palpitations   . Pneumonia 07/09/2015  . Shingles   . Sleep apnea    "never completed part 2 of study; never wore mask" (07/09/2015)  . Vitamin B 12 deficiency   . Vitamin D deficiency      Review of Systems: ROS -All review of systems are negative except what is noted on the a/p.    Vitals:   01/04/20 1039  BP: (!) 188/93  Pulse: 78  Temp: 98.2 F (36.8 C)  TempSrc: Oral  SpO2: 91%  Weight: 233 lb 3.2 oz (105.8 kg)  Height: 5\' 3"  (1.6 m)     Physical Exam: Physical Exam  Constitutional: She is oriented to person, place, and time and well-developed, well-nourished, and in no distress.  HENT:  Head: Normocephalic and atraumatic.  Eyes: EOM are normal.  Neck: JVD present.  Cardiovascular: Normal  rate and intact distal pulses. Exam reveals no gallop and no friction rub.  No murmur heard. Pulmonary/Chest: She has rales (bilateral lower lung fields). She exhibits tenderness (rightaxillary region).  Abdominal: Soft. She exhibits no distension. There is abdominal tenderness (bloating).  Musculoskeletal:        General: Edema (2+ pitting edema LE bilaterally) present. Normal range of motion.     Cervical back: Normal range of motion.  Neurological: She is alert and oriented to person, place, and time.  Skin: Skin is warm and dry.     Assessment & Plan:   See Encounters Tab for problem based charting.  Patient discussed with Dr. Rebeca Alert

## 2020-01-04 NOTE — Assessment & Plan Note (Addendum)
Patient presents with worsening of her hypertension with her most recent blood pressure at 188/93.  She was recently decreased on a clonidine patch to 0.1 mg due to side effects that she believes is due to a steep drop in her blood pressure.  She states that she has not been taking her furosemide as she believes that dehydration is her makes her mouth feel dry.  Consequently, she has been drinking more water to combat these symptoms.  On evaluation today the patient had signs of hypervolemia including JVD, abdominal bloating with early satiety lower extremity edema and shortness of breath and exercise tolerance.  Patient's most recent echocardiogram showed an LV ejection fraction within normal limits and no signs of right heart failure or pulmonary arterial hypertension.  Due to these symptoms, I was concerned that the patient was currently having an acute episode of CHF exacerbation.  Clinic work-up showed stable kidney function, normal BNP and no evidence of significantly increased pulmonary congestion on chest x-ray.  I counseled the patient on the importance of taking her furosemide to limit symptoms from her CHF.  Plan: -I told the patient to take her furosemide 40 mg daily for the next week I will reevaluate her in a week I -I will reevaluate her clearance for her dental procedure once her shortness of breath has improved -She will likely need increased titration of her antihypertensive medications at that time. -She will likely need further evaluation management to rule out any obesity hypoventilation/obstructive sleep apnea as a worsening cause of her respiratory distress.  She is currently followed by likely has some component of hypercarbic pulmonary disease such as COPD.

## 2020-01-04 NOTE — Assessment & Plan Note (Addendum)
Patient has increased size of left hilar lung nodule since previous imaging.  Radiology recommends follow-up imaging with CT scan.  I will call patient today to schedule a CT scan for this week.  We will follow up on these results at her appointment in 1 week. Patient made aware of result and need for CT scan.   Plan: -Noncontrast CT of the chest

## 2020-01-05 NOTE — Addendum Note (Signed)
Addended by: Lawerance Cruel on: 01/05/2020 09:18 AM   Modules accepted: Orders

## 2020-01-09 NOTE — Progress Notes (Signed)
Internal Medicine Clinic Attending  I saw and evaluated the patient.  I personally confirmed the key portions of the history and exam documented by Dr. Marianna Payment and I reviewed pertinent patient test results.  The assessment, diagnosis, and plan were formulated together and I agree with the documentation in the resident's note.  She reports worsening dyspnea and has increased leg swelling, has not been taking her diuretic because it makes her feel "dehydrated". We were able to determine she primarily is bothered by dry mouth. Discussed ways to moisten mouth without leading to volume overload and resuming diuretics. BNP is not elevated, no hypoxia, renal function is stable, CXR and EKG (personally reviewed) without acute changes. Will follow up in 1 week to ensure volume status is improving.  CXR also notes enlarging hilar mass, will order CT chest to evaluate further.   Lenice Pressman, M.D., Ph.D.

## 2020-01-10 ENCOUNTER — Other Ambulatory Visit: Payer: Self-pay

## 2020-01-10 ENCOUNTER — Ambulatory Visit (HOSPITAL_COMMUNITY)
Admission: RE | Admit: 2020-01-10 | Discharge: 2020-01-10 | Disposition: A | Discharge status: Home / Self Care | Payer: Medicare Other | Source: Ambulatory Visit | Attending: Internal Medicine | Admitting: Internal Medicine

## 2020-01-10 ENCOUNTER — Encounter (HOSPITAL_COMMUNITY): Payer: Self-pay

## 2020-01-10 DIAGNOSIS — R911 Solitary pulmonary nodule: Secondary | ICD-10-CM | POA: Diagnosis not present

## 2020-01-10 NOTE — Addendum Note (Signed)
Addended by: Oda Kilts on: 01/10/2020 08:09 AM   Modules accepted: Orders

## 2020-01-10 NOTE — Telephone Encounter (Signed)
Patient never sent clearance.

## 2020-01-11 ENCOUNTER — Telehealth: Payer: Self-pay | Admitting: *Deleted

## 2020-01-11 ENCOUNTER — Encounter: Payer: Self-pay | Admitting: Internal Medicine

## 2020-01-11 ENCOUNTER — Ambulatory Visit (INDEPENDENT_AMBULATORY_CARE_PROVIDER_SITE_OTHER): Payer: Medicare Other | Admitting: Internal Medicine

## 2020-01-11 VITALS — BP 145/88 | HR 95 | Temp 98.5°F | Ht 63.0 in | Wt 232.9 lb

## 2020-01-11 DIAGNOSIS — J45909 Unspecified asthma, uncomplicated: Secondary | ICD-10-CM | POA: Diagnosis not present

## 2020-01-11 DIAGNOSIS — J449 Chronic obstructive pulmonary disease, unspecified: Secondary | ICD-10-CM

## 2020-01-11 DIAGNOSIS — R911 Solitary pulmonary nodule: Secondary | ICD-10-CM | POA: Diagnosis not present

## 2020-01-11 DIAGNOSIS — J4541 Moderate persistent asthma with (acute) exacerbation: Secondary | ICD-10-CM

## 2020-01-11 DIAGNOSIS — G4733 Obstructive sleep apnea (adult) (pediatric): Secondary | ICD-10-CM | POA: Diagnosis not present

## 2020-01-11 DIAGNOSIS — R269 Unspecified abnormalities of gait and mobility: Secondary | ICD-10-CM | POA: Diagnosis not present

## 2020-01-11 MED ORDER — ALBUTEROL SULFATE (2.5 MG/3ML) 0.083% IN NEBU
2.5000 mg | INHALATION_SOLUTION | Freq: Once | RESPIRATORY_TRACT | Status: AC
  Administered 2020-01-11: 2.5 mg via RESPIRATORY_TRACT

## 2020-01-11 MED ORDER — PREDNISONE 20 MG PO TABS: 40.0000 mg | ORAL_TABLET | Freq: Every day | ORAL | 0 refills | Status: AC

## 2020-01-11 MED ORDER — AZITHROMYCIN 250 MG PO TABS: ORAL_TABLET | ORAL | 0 refills | Status: AC

## 2020-01-11 NOTE — Telephone Encounter (Signed)
Spoke with patient and she would like Dr Oval Linsey to call her She has been having shortness of breath and had CT scan which revealed lung mass They are moving forward with PET scan  Patient would like to speak with Dr Oval Linsey, will forward to her for review

## 2020-01-11 NOTE — Telephone Encounter (Signed)
Patient wanted to make sure Dr Baxter Flattery is aware of the findings on her Chest CT. She is being scheduled for PET and biopsy. Landis Gandy, RN

## 2020-01-11 NOTE — Assessment & Plan Note (Signed)
Shannon [name] is a 64 yo F w/ PMH of asthma, HFpEF, HTN, A.flutter on Xarelto and CAD presenting to [facility] w/ f/u for dyspnea. She was seen in clinic last week and was thought to be in acute diastolic heart failure exacerbation. Advised to restart her home diuretics and was sent for chest X-ray. X-ray did not show significant congestion and her lower extremity edema has improved but she continues to have dyspnea. She mentions that she needed to increase her rescue inhaler frequency to 3-4 times daily. She mentions taking short course of amoxicillin for a dental infection recently and having some improvement in her symptoms. She mentions that she feels she need to be admitted due to her dyspnea. She also expresses concern regarding need to see different providers and express discontent in not being able to see her PCP at acute care clinic.  A/P Shannon [name] presents for f/u evaluation for dyspnea. On exam, barrel-chested female tri-poding in respiratory distress. Having difficulty getting words out. Currently 94 on RA. Drop to 88 with ambulation. She has chronic oxygen (1.5L) at home. Provided albuterol nebulizer treatment in office with significant improvement in symptoms. Currently on Breo, montelukast for maintenance. PFT from 2017 reviewed w/ FEV/FVC 50%, FEV1 0.64->0.8 post-bronch consistent with asthma. However, considering advanced age and lack of improvement with inhaled corticosteroids, possibly beginning to develop overlap with COPD. Likely need repeat PFTs.  - Prednisone 40mg  daily for 5 days - Azithromycin 500mg  1 day + 250mg  for 4 days - F/u with pulm - Albuterol inhaler/nebulizer PRN as needed

## 2020-01-11 NOTE — Progress Notes (Addendum)
CC: Shortness of breath  HPI: [name] is a 64 y.o. with PMH listed below presenting with complaint of dyspnea. Please see problem based assessment and plan for further details.  Past Medical History:  Diagnosis Date  . Anemia   . Anxiety    HX PANIC ATTACKS  . Arthritis    "starting to; in my hands" (07/09/2015)  . Asthma   . Atrial fibrillation ([place])   . Atrial flutter, paroxysmal ([place])   . Bloated abdomen   . CFS (chronic fatigue syndrome)   . Chewing difficulty   . Chronic asthma with acute exacerbation    "I have chronic asthma all the time; sometimes exacerbations" (07/09/2015)  . Chronic lower back pain   . COPD (chronic obstructive pulmonary disease) ([place])   . Cyst of right kidney    "3 of them; dx'd in ~ 01/2015"  . Dyspnea   . GERD (gastroesophageal reflux disease)   . Heart murmur   . History of blood transfusion    "related to my brain surgery I think"  . History of pulmonary embolism 07/09/2015  . HIV antibody positive ([place])   . HIV disease ([place])   . Hyperlipidemia   . Hypertension   . Leg edema   . Lipodystrophy   . Mild CAD 2013  . Osteopenia   . Palpitations   . Pneumonia 07/09/2015  . Shingles   . Sleep apnea    "never completed part 2 of study; never wore mask" (07/09/2015)  . Vitamin B 12 deficiency   . Vitamin D deficiency     Review of Systems: Review of Systems  Constitutional: Positive for malaise/fatigue. Negative for chills and fever.  Eyes: Negative for blurred vision.  Respiratory: Positive for cough and shortness of breath. Negative for sputum production and wheezing.   Cardiovascular: Positive for palpitations. Negative for chest pain.  Gastrointestinal: Negative for constipation, diarrhea, nausea and vomiting.  Genitourinary: Negative for dysuria and urgency.  Musculoskeletal: Positive for back pain, joint pain and neck pain.  Neurological: Negative for dizziness, tingling and headaches.  All other systems reviewed and  are negative.    Physical Exam: Vitals:   01/11/20 0943  BP: (!) 145/88  Pulse: 95  Temp: 98.5 F (36.9 C)  TempSrc: Oral  SpO2: 94%  Weight: 232 lb 14.4 oz (105.6 kg)  Height: 5\' 3"  (1.6 m)    Physical Exam  Constitutional: She is oriented to person, place, and time. She appears well-developed and well-nourished. She appears distressed.  Eyes: Conjunctivae are normal.  Neck:  Lipodistrophy  Cardiovascular: Regular rhythm, normal heart sounds and intact distal pulses.  No murmur heard. Tachycardic  Respiratory: She is in respiratory distress. She has no wheezes.  Distant breath sounds, tri-poding  GI: Soft. Bowel sounds are normal. She exhibits no distension. There is no abdominal tenderness.  Musculoskeletal:        General: No edema. Normal range of motion.  Neurological: She is alert and oriented to person, place, and time.  Skin: Skin is warm and dry.    Assessment & Plan:   Moderate persistent asthma with exacerbation Mrs.Shannon [name] is a 64 yo F w/ PMH of asthma, HFpEF, HTN, A.flutter on Xarelto and CAD presenting to [facility] w/ f/u for dyspnea. She was seen in clinic last week and was thought to be in acute diastolic heart failure exacerbation. Advised to restart her home diuretics and was sent for chest X-ray. X-ray did not show significant congestion and her lower extremity  edema has improved but she continues to have dyspnea. She mentions that she needed to increase her rescue inhaler frequency to 3-4 times daily. She mentions taking short course of amoxicillin for a dental infection recently and having some improvement in her symptoms. She mentions that she feels she need to be admitted due to her dyspnea. She also expresses concern regarding need to see different providers and express discontent in not being able to see her PCP at acute care clinic.  A/P Mrs.Shannon [name] presents for f/u evaluation for dyspnea. On exam, barrel-chested female tri-poding in respiratory distress. Having  difficulty getting words out. Currently 94 on RA. Drop to 88 with ambulation. She has chronic oxygen (1.5L) at home. Provided albuterol nebulizer treatment in office with significant improvement in symptoms. Currently on Breo, montelukast for maintenance. PFT from 2017 reviewed w/ FEV/FVC 50%, FEV1 0.64->0.8 post-bronch consistent with asthma. However, considering advanced age and lack of improvement with inhaled corticosteroids, possibly beginning to develop overlap with COPD. Likely need repeat PFTs.  - Prednisone 40mg  daily for 5 days - Azithromycin 500mg  1 day + 250mg  for 4 days - F/u with pulm - Albuterol inhaler/nebulizer PRN as needed  Lung nodule CT finding reviewed with [name]. New pulmonary nodule 2.7 x 1.9 x 1.9 on LUL. Noted to have hilar opacity on previous X-ray per radiology. Mentions smoking in teenage years and prior exposure to second-hand smoke but denies significant pack-year smoking history. Denies any recent weight change or hemoptysis. No significant family hx. Discussed option of getting PET scan as well as need for biopsy. [name] discussed understanding but mentions some reservations regarding invasive procedures. Advised to f/u with pulm  - F/u with pulm - PET scan ordered    Patient seen with Dr. Evette Doffing   -Shannon [name], [place] Pager: [phone]

## 2020-01-11 NOTE — Assessment & Plan Note (Addendum)
CT finding reviewed with [name]. New pulmonary nodule 2.7 x 1.9 x 1.9 on LUL. Noted to have hilar opacity on previous X-ray per radiology. Mentions smoking in teenage years and prior exposure to second-hand smoke but denies significant pack-year smoking history. Denies any recent weight change or hemoptysis. No significant family hx. Discussed option of getting PET scan as well as need for biopsy. [name] discussed understanding but mentions some reservations regarding invasive procedures. Advised to f/u with pulm  - F/u with pulm - PET scan ordered

## 2020-01-11 NOTE — Assessment & Plan Note (Signed)
Seen in clinic last week and advised to restart diuretics. Currently on furosemide 40mg  daily. Euvolemic on exam. BNp 64. Imaging does not show significant pulmonary edema.

## 2020-01-11 NOTE — Progress Notes (Signed)
Internal Medicine Clinic Attending  I saw and evaluated the patient.  I personally confirmed the key portions of the history and exam documented by Dr. Lee and I reviewed pertinent patient test results.  The assessment, diagnosis, and plan were formulated together and I agree with the documentation in the resident's note.  

## 2020-01-11 NOTE — Addendum Note (Signed)
Addended by: Mosetta Anis on: 01/11/2020 03:56 PM   Modules accepted: Orders

## 2020-01-11 NOTE — Patient Instructions (Addendum)
Thank you for allowing [place] to provide your care today. Today we discussed your shortness of breath    I have ordered no labs for you. I will call if any are abnormal.    Today we made the following changes to your medications.    Please stop your amoxicillin Please start azithromycin 500mg  first day and then 250mg  for the next 4 days Please start prednisone 40mg  (2 tablets) for 5 days We will start the process for getting PET scan fo ryou  Please follow-up with Dr.Mullen for your next visit.    Should you have any questions or concerns please call the internal medicine clinic at [phone].     Pulmonary Nodule A pulmonary nodule is tissue that has grown on your lung. A nodule may be cancer, but most nodules are not cancer. Follow these instructions at home:   Take over-the-counter and prescription medicines only as told by your doctor.  Do not use any products that have nicotine or tobacco, such as cigarettes and e-cigarettes. If you need help quitting, ask your doctor.  Keep all follow-up visits as told by your doctor. This is important. Contact a doctor if:  You have trouble breathing when doing activities.  You feel sick.  You feel more tired than normal.  You do not feel like eating.  You lose weight without trying.  You have chills.  You have night sweats. Get help right away if:  You cannot catch your breath.  You start making whistling sounds when breathing (wheezing).  You cannot stop coughing.  You cough up blood.  You get dizzy.  You feel like you are going to pass out (faint).  You have sudden chest pain.  You have a fever or symptoms for more than 2-3 days.  You have a fever and your symptoms suddenly get worse. Summary  A pulmonary nodule is tissue that has grown on your lung.  Most nodules are not cancer.  Your doctor will do tests to know what kind of nodule you have, and whether you need treatment for it. This information is not  intended to replace advice given to you by your health care provider. Make sure you discuss any questions you have with your health care provider. Document Revised: 09/03/2017 Document Reviewed: 09/08/2016 Elsevier Patient Education  [place].

## 2020-01-16 NOTE — Telephone Encounter (Signed)
Spoke with Shannon [name] at length on the evening of 5/24.  Thank you for letting me know.

## 2020-01-18 ENCOUNTER — Telehealth: Payer: Self-pay | Admitting: Adult Health

## 2020-01-18 NOTE — Telephone Encounter (Signed)
Patient is willing to see Dr. Elsworth Soho for the next appointment, Dr. Chase Caller is not available. Patient will request to switch to Dr. Chase Caller after next visit.

## 2020-01-25 ENCOUNTER — Ambulatory Visit (HOSPITAL_COMMUNITY)
Admission: RE | Admit: 2020-01-25 | Discharge: 2020-01-25 | Disposition: A | Discharge status: Home / Self Care | Payer: Medicare Other | Source: Ambulatory Visit | Attending: Student in an Organized Health Care Education/Training Program | Admitting: Student in an Organized Health Care Education/Training Program

## 2020-01-25 ENCOUNTER — Other Ambulatory Visit: Payer: Self-pay

## 2020-01-25 DIAGNOSIS — N281 Cyst of kidney, acquired: Secondary | ICD-10-CM | POA: Diagnosis not present

## 2020-01-25 DIAGNOSIS — R590 Localized enlarged lymph nodes: Secondary | ICD-10-CM | POA: Insufficient documentation

## 2020-01-25 DIAGNOSIS — I700 Atherosclerosis of aorta: Secondary | ICD-10-CM | POA: Insufficient documentation

## 2020-01-25 DIAGNOSIS — E041 Nontoxic single thyroid nodule: Secondary | ICD-10-CM | POA: Insufficient documentation

## 2020-01-25 DIAGNOSIS — I2510 Atherosclerotic heart disease of native coronary artery without angina pectoris: Secondary | ICD-10-CM | POA: Diagnosis not present

## 2020-01-25 DIAGNOSIS — R911 Solitary pulmonary nodule: Secondary | ICD-10-CM | POA: Insufficient documentation

## 2020-01-25 LAB — GLUCOSE, CAPILLARY: Glucose-Capillary: 122 mg/dL — ABNORMAL HIGH (ref 70–99)

## 2020-01-25 MED ORDER — FLUDEOXYGLUCOSE F - 18 (FDG) INJECTION
11.9000 | Freq: Once | INTRAVENOUS | Status: AC | PRN
  Administered 2020-01-25: 11.9 via INTRAVENOUS

## 2020-01-26 ENCOUNTER — Telehealth: Payer: Self-pay | Admitting: Internal Medicine

## 2020-01-26 ENCOUNTER — Encounter: Payer: Self-pay | Admitting: *Deleted

## 2020-01-26 ENCOUNTER — Other Ambulatory Visit: Payer: Self-pay | Admitting: Internal Medicine

## 2020-01-26 DIAGNOSIS — R911 Solitary pulmonary nodule: Secondary | ICD-10-CM

## 2020-01-26 DIAGNOSIS — E041 Nontoxic single thyroid nodule: Secondary | ICD-10-CM

## 2020-01-26 NOTE — Telephone Encounter (Signed)
Attempted to call Ms.Nunnery to discuss PET scan results. Patient did not pick up. Left voicemail with callback number.

## 2020-01-26 NOTE — Progress Notes (Signed)
Received PET scan results with finding of primary bronchogenic neoplasm. Will make referral for oncology.

## 2020-01-26 NOTE — Telephone Encounter (Signed)
Spoke with Shannon [name] regarding her PET result. Per request, described each one of the PET scan findings. Discussed need to f/u with heme/onc and pulm. Also discussed need to f/u thyroid nodule and possible mucosal lesion of her right nasopharynx. Had prolonged conversation regarding importance of going to these appointments and ensuring she is not lost to care. Shannon [name] expressed understanding. All other questions and concerns addressed.

## 2020-01-26 NOTE — Assessment & Plan Note (Signed)
Incidental Finding of L thyroid nodule on PET scan. Most recent TSH low-normal. Mentions family history of thyroid disease. Denies any palpitations, heat/cold intolerance or weight changes. Per radiology guidelines, recommended to get thyroid US w/ biopsy  - [place] w/ biopsy

## 2020-01-26 NOTE — Telephone Encounter (Signed)
Pt missed a call please contact [phone]

## 2020-01-26 NOTE — Telephone Encounter (Signed)
Received a new patient referral from Dr. Daryll Drown for new dx of lung cancer. Ms. Slider cld me to schedule an appt w/Dr. Julien Nordmann on 6/15 at 2:15pm w/labs at 1:45pm.

## 2020-01-26 NOTE — Progress Notes (Signed)
I received referral on Ms. Sapps today.  I notified scheduling to call and schedule on 6/15 with Dr. Julien Nordmann.

## 2020-01-29 ENCOUNTER — Other Ambulatory Visit: Payer: Self-pay | Admitting: Internal Medicine

## 2020-01-29 DIAGNOSIS — E041 Nontoxic single thyroid nodule: Secondary | ICD-10-CM

## 2020-01-30 ENCOUNTER — Other Ambulatory Visit: Payer: Self-pay | Admitting: *Deleted

## 2020-01-30 ENCOUNTER — Other Ambulatory Visit: Payer: Self-pay

## 2020-01-30 NOTE — Telephone Encounter (Signed)
Pt states she does not need to talk to nurse as someone has scheduled her an appt for 6/9

## 2020-01-30 NOTE — Telephone Encounter (Signed)
Requesting to speak with a nurse about meds refill, please call pt back.

## 2020-01-31 ENCOUNTER — Ambulatory Visit (INDEPENDENT_AMBULATORY_CARE_PROVIDER_SITE_OTHER): Payer: Medicare Other | Admitting: Internal Medicine

## 2020-01-31 DIAGNOSIS — J4541 Moderate persistent asthma with (acute) exacerbation: Secondary | ICD-10-CM | POA: Diagnosis not present

## 2020-01-31 DIAGNOSIS — J019 Acute sinusitis, unspecified: Secondary | ICD-10-CM | POA: Diagnosis not present

## 2020-01-31 MED ORDER — PREDNISONE 20 MG PO TABS: 40.0000 mg | ORAL_TABLET | Freq: Every day | ORAL | 0 refills | Status: AC

## 2020-01-31 MED ORDER — AZITHROMYCIN 250 MG PO TABS: ORAL_TABLET | ORAL | 0 refills | Status: AC

## 2020-01-31 NOTE — Progress Notes (Signed)
  [facility] Telephone Encounter Continuity Care Appointment  HPI:   This telephone encounter was created for [name] on 01/31/2020 for the following purpose/cc "she does not feel good" last couple of days. Please refer to problem based charting for further details. She has green phlegm on back of her throat and with her cough. (she has mild cough). No fever, light headache, pressure around her forehead.  No significant SOB. She thinks it is sinusitis.   Past Medical History:  Past Medical History:  Diagnosis Date  . Anemia   . Anxiety    HX PANIC ATTACKS  . Arthritis    "starting to; in my hands" (07/09/2015)  . Asthma   . Atrial fibrillation ([place])   . Atrial flutter, paroxysmal ([place])   . Bloated abdomen   . CFS (chronic fatigue syndrome)   . Chewing difficulty   . Chronic asthma with acute exacerbation    "I have chronic asthma all the time; sometimes exacerbations" (07/09/2015)  . Chronic lower back pain   . COPD (chronic obstructive pulmonary disease) ([name])   . Cyst of right kidney    "3 of them; dx'd in ~ 01/2015"  . Dyspnea   . GERD (gastroesophageal reflux disease)   . Heart murmur   . History of blood transfusion    "related to my brain surgery I think"  . History of pulmonary embolism 07/09/2015  . HIV antibody positive ([place])   . HIV disease ([place])   . Hyperlipidemia   . Hypertension   . Leg edema   . Lipodystrophy   . Mild CAD 2013  . Osteopenia   . Palpitations   . Pneumonia 07/09/2015  . Shingles   . Sleep apnea    "never completed part 2 of study; never wore mask" (07/09/2015)  . Vitamin B 12 deficiency   . Vitamin D deficiency       ROS:   No fever, light headache, pressure around her forehead. PND   Assessment / Plan / Recommendations:   Please see A&P under problem oriented charting for assessment of the patient's acute and chronic medical conditions.   As always, pt is advised that if symptoms worsen or  new symptoms arise, they should go to an urgent care facility or to to ER for further evaluation.   Consent and Medical Decision Making:   Patient discussed with Dr. Philipp Ovens.  This is a telephone encounter between Shannon [name] and [place] on 01/31/2020 for PND and sinusitis symptoms. The visit was conducted with the patient located at home and [facility] at [facility]. The patient's identity was confirmed using their DOB and current address. The patient has consented to being evaluated through a telephone encounter and understands the associated risks (an examination cannot be done and the patient may need to come in for an appointment) / benefits (allows the patient to remain at home, decreasing exposure to coronavirus). I personally spent 15 minutes on medical discussion.

## 2020-01-31 NOTE — Assessment & Plan Note (Deleted)
(  Televisit) She reports she does not feel well. For past few days, she has green phlegm on back of her throat and with her cough. She beleivs the phlegm comes from her sinuses and not from her lungs. Has pressure around her eyes and on her forehead. This gets worse when she leans forward. She only has mild cough and no significant SOB. No fever, light headache, pressure around her forehead.  No significant SOB. She is not willing to come to [facility] today and mentions that she has an appointment next week. (I ask her if she thinks her asthma is worse, and she gets angry that she is being told that she has asthma.) Her symptoms are likely secondary to acute (recurrent) sinusitis. She had similar symptoms 3 weeks ago and responded well to Ab and Prednison. She asks for same regimen. Not willing to come to the clinic. I send a script for Azithromycin. She does not think this  Is an asthma exacerbation and only reports mild SOB but then she says she needs more oxygens and I will provide short course of Prednisone per her request and as medically appropriate until she comes to [facility]. She is aware she should come to the emergency room if developed shortness of breath.  -Z pack 5 days course -Prednisoe 40 mg QD x 5 days -ED or in-person visit precautions provided -Continue bronchodilator inhalers as needed

## 2020-01-31 NOTE — Assessment & Plan Note (Signed)
(  Televisit) She reports she does not feel well. She was treated with Azithromycin and prednisone 3 weeks a go for dyspnea and worsening of asthma. She felt better   For past few days, she does not feel well. Reports symptoms of sinusitis: (green phlegm on back of her throat and with her cough. She beleivs the phlegm comes from her sinuses and not from her lungs. Has pressure around her eyes and on her forehead. This gets worse when she leans forward.)  She has mild cough and some SOB. No fever. She is not willing to come to [facility] today and mentions that she has an appointment next week. (I ask her if she thinks her asthma is worse, and she gets angry that she is being told that she has asthma.) Her symptoms are likely secondary to her asthma that exacerbated with  sinusitis. She had similar symptoms 3 weeks ago and responded well to Ab and Prednison. She asks for same regimen. Not willing to come to the clinic.  I send a script for Azithromycin. She does not think this is an asthma exacerbation and only reports mild SOB but then she says she needs more oxygens for past month or so. I will provide short course of Prednisone until she comes to [facility]. She is aware she should come to clinic in person if worsening of symptoms occurs or go the emergency room if developed more shortness of breath.  -Z pack 5 days course -Prednisoe 40 mg QD x 5 days -Continue bronchodilator inhalers as needed -I recommend her to come to clinic tomorrow but she prefers to wait till next week. Instructed to come to if her symptoms got worse. ED visit precautions provided

## 2020-02-01 ENCOUNTER — Ambulatory Visit (INDEPENDENT_AMBULATORY_CARE_PROVIDER_SITE_OTHER): Payer: Medicare Other | Admitting: Internal Medicine

## 2020-02-01 ENCOUNTER — Other Ambulatory Visit: Payer: Self-pay

## 2020-02-01 ENCOUNTER — Ambulatory Visit: Payer: Medicare Other | Admitting: *Deleted

## 2020-02-01 VITALS — Wt 232.0 lb

## 2020-02-01 DIAGNOSIS — J4489 Other specified chronic obstructive pulmonary disease: Secondary | ICD-10-CM

## 2020-02-01 DIAGNOSIS — N183 Chronic kidney disease, stage 3 unspecified: Secondary | ICD-10-CM

## 2020-02-01 DIAGNOSIS — B20 Human immunodeficiency virus [HIV] disease: Secondary | ICD-10-CM

## 2020-02-01 DIAGNOSIS — R918 Other nonspecific abnormal finding of lung field: Secondary | ICD-10-CM | POA: Diagnosis not present

## 2020-02-01 DIAGNOSIS — I5032 Chronic diastolic (congestive) heart failure: Secondary | ICD-10-CM

## 2020-02-01 DIAGNOSIS — J449 Chronic obstructive pulmonary disease, unspecified: Secondary | ICD-10-CM

## 2020-02-01 NOTE — Chronic Care Management (AMB) (Signed)
  Chronic Care Management   Note  02/01/2020 Name: Shannon [name] MRN: [id number] DOB: 1956/01/27   Received CCM referral on 6/4 to assist patient with care coordination with specialist as she was recently diagnoses with lung cancer and has a suspicious thyroid nodule and possibly nasopharynx lesion. She is scheduled to see an oncologist to establish care on  6/15. She also has a clinic appointment  on 6/16. Successful outreach to patient via mobile number after no answer at home number and no option to leave message. Patient states she is currently attending an office visit with Dr Wilder Glade in infectious disease, patient has hx of HIV.  patient agrees to meet with this CCM RN after her clinic appointment on 02/07/20.  Follow up plan: Face to Face appointment with care management team member scheduled for: 02/07/20 at 11: 15 am.  Kelli Churn RN, CCM, [facility] RN Care Manager [phone]

## 2020-02-01 NOTE — Progress Notes (Signed)
Patient ID: Shannon [name], female   DOB: 10-11-1955, 64 y.o.   MRN: [id number]  HPI 64yo F with history of longstanding well controlled HIV disease, obesity, asthma, CAD, CKD3 who has recently been Newly dx LUL mass concerning for lung cancer as it also + on PET scan. PET scan also showed Left axilla LN, though she has recently had covid vaccine. She is being worked up for Thyroid nodule - getting U/S early next week. She feels all these health problems have been incredibly stressful. She reports that she has been placed on steroids and azithromycin to help with recent ashtma exacerbation.  Had pfizer vaccine doses, last dose in may - noticed to have axillary LN soreness   Outpatient Encounter Medications as of 02/01/2020  Medication Sig  . acetaminophen (TYLENOL) 325 MG tablet Take 325 mg by mouth every 6 (six) hours as needed (FOR PAIN).  [name] albuterol (PROVENTIL) (2.5 MG/3ML) 0.083% nebulizer solution USE 1 VIAL VIA NEBULIZER EVERY 6 HOURS AS NEEDED FOR WHEEZING OR SHORTNESS OF BREATH  . Ascorbic Acid (VITAMIN C) 1000 MG tablet Take 1,000 mg by mouth 2 (two) times a week.   [name] azithromycin (ZITHROMAX Z-PAK) 250 MG tablet Take 2 tablets (500 mg) on  Day 1,  followed by 1 tablet (250 mg) once daily on Days 2 through 5.  . bictegravir-emtricitabine-tenofovir AF (BIKTARVY) 50-200-25 MG TABS tablet Take 1 tablet by mouth daily.  [name] BIOTIN PO Take 1 tablet by mouth daily as needed (for supplementation).   . Cholecalciferol (SM VITAMIN D3) 100 MCG (4000 UT) CAPS Take 1 capsule (4,000 Units total) by mouth daily.  . cloNIDine (CATAPRES - DOSED IN MG/24 HR) 0.1 mg/24hr patch Place 3 patches (0.3 mg total) onto the skin once a week.  . cyanocobalamin (,VITAMIN B-12,) 1000 MCG/ML injection ADMINISTER 1 ML(1000 MCG) IN THE MUSCLE EVERY 30 DAYS  . diclofenac sodium (VOLTAREN) 1 % GEL Apply 2 g topically 3 (three) times daily as needed (arm pain).  [name] diltiazem (CARDIZEM CD) 240 MG 24 hr capsule TAKE 1  CAPSULE(240 MG) BY MOUTH DAILY  . fluticasone (FLONASE) 50 MCG/ACT nasal spray Place 2 sprays into both nostrils daily. (Patient taking differently: Place 2 sprays into both nostrils daily as needed for allergies. )  . fluticasone furoate-vilanterol (BREO ELLIPTA) 200-25 MCG/INH AEPB Inhale 1 puff into the lungs daily.  . furosemide (LASIX) 40 MG tablet Take 1 tablet (40 mg total) by mouth daily.  [name] MAGNESIUM PO Take by mouth every other day.  . Melatonin 5 MG TABS Take 5 mg by mouth at bedtime as needed (for sleep).  . Misc. Devices (PULSE OXIMETER FOR FINGER) MISC 1 Units by Does not apply route as needed.  . montelukast (SINGULAIR) 10 MG tablet Take 1 tablet (10 mg total) by mouth at bedtime. (Patient taking differently: Take 10 mg by mouth daily as needed (allergies). )  . Mouthwashes (BIOTENE DRY MOUTH GENTLE) LIQD Use as directed 1 Dose in the mouth or throat 2 (two) times daily.  [name] omeprazole (PRILOSEC) 40 MG capsule TAKE 1 CAPSULE(40 MG) BY MOUTH DAILY  . OXYGEN Inhale 1.5-2 L into the lungs continuous.   . polyethylene glycol (MIRALAX / GLYCOLAX) packet Take 17 g by mouth daily. (Patient taking differently: Take 17 g by mouth daily as needed for mild constipation. )  . predniSONE (DELTASONE) 20 MG tablet Take 2 tablets (40 mg total) by mouth daily with breakfast for 5 days.  [name] PREVIDENT 5000 SENSITIVE  1.1-5 % PSTE Use daily as directed  . PROAIR HFA 108 (90 Base) MCG/ACT inhaler INHALE 2 PUFFS INTO THE LUNGS EVERY 4 HOURS AS NEEDED FOR WHEEZING OR SHORTNESS OF BREATH  . senna-docusate (SENOKOT-S) 8.6-50 MG tablet Take 1 tablet by mouth at bedtime as needed for mild constipation.  [name] spironolactone (ALDACTONE) 25 MG tablet Take 1 tablet (25 mg total) by mouth daily.  . SYRINGE/NEEDLE, DISP, 1 ML (B-D SYRINGE/NEEDLE 1CC/25GX5/8) 25G X 5/8" 1 ML MISC 1 Units by Does not apply route every 30 (thirty) days.  Alveda Reasons 20 MG TABS tablet TAKE 1 TABLET(20 MG) BY MOUTH DAILY WITH SUPPER   No  facility-administered encounter medications on file as of 02/01/2020.     Patient Active Problem List   Diagnosis Date Noted  . Thyroid nodule 01/26/2020  . Lung nodule 01/04/2020  . Chronic obstructive asthma ([place]) 12/29/2019  . Myalgia 10/24/2019  . (HFpEF) heart failure with preserved ejection fraction ([place]) 02/07/2019  . Acute sinusitis 12/01/2018  . Leg swelling 11/03/2018  . Other fatigue 06/30/2018  . Shortness of breath on exertion 06/30/2018  . Hyperglycemia 06/30/2018  . Vitamin D deficiency 06/30/2018  . Chronic anticoagulation 01/03/2018  . Constipation 11/03/2017  . Atrial flutter ([place]) 03/11/2017  . CAD (coronary artery disease) 01/28/2017  . Obesity (BMI 30-39.9) 01/13/2017  . Ascending aorta dilatation ([facility]) 01/07/2017  . Low back pain radiating to right lower extremity 12/02/2016  . Acquired cyst of kidney 12/02/2016  . Chronic kidney disease (CKD), stage III (moderate) 12/01/2016  . Generalized anxiety disorder 10/22/2016  . Hypersomnia 10/01/2016  . Accessory skin tags 06/10/2016  . Nocturnal hypoxemia 05/13/2016  . Cervical radiculopathy 04/14/2016  . GERD (gastroesophageal reflux disease) 01/02/2016  . Vitamin B12 deficiency 10/02/2015  . Moderate persistent asthma with exacerbation 07/09/2015  . HIV disease ([place]) 07/09/2015  . Uncontrolled hypertension 07/09/2015     Health Maintenance Due  Topic Date Due  . TETANUS/TDAP  Never done  . COLONOSCOPY  Never done  . MAMMOGRAM  03/26/2018  . DEXA SCAN  Never done  . PNA vac Low Risk Adult (1 of 2 - PCV13) Never done  . COLON CANCER SCREENING ANNUAL FOBT  05/06/2019     Review of Systems 12 point ros is negative, except for shortness of breath with ambulation, but worse with humidity she feels that she is at her baseline. She reports poor sleep due to recent diagnosis of possible lung ca Physical Exam  Wt 232 lb (105.2 kg)   BMI 41.10 kg/m  Physical Exam  Constitutional:  oriented to person,  place, and time. appears well-developed and well-nourished. No distress.  HENT: [place]/AT, PERRLA, no scleral icterus Mouth/Throat: Oropharynx is clear and moist. No oropharyngeal exudate.  Cardiovascular: Normal rate, regular rhythm and normal heart sounds. Exam reveals no gallop and no friction rub.  No murmur heard.  Pulmonary/Chest: Effort normal and breath sounds normal. No respiratory distress.  has no wheezes.  Neck = supple, no nuchal rigidity Abdominal: Soft. Bowel sounds are normal.  exhibits no distension. There is no tenderness.  Lymphadenopathy: no cervical adenopathy. + small Left axillary adenopathy Neurological: alert and oriented to person, place, and time.  Skin: Skin is warm and dry. No rash noted. No erythema.  Psychiatric: tearful   Lab Results  Component Value Date   CD4TCELL 45 11/10/2019   Lab Results  Component Value Date   CD4TABS 669 11/10/2019   CD4TABS 530 09/07/2019   CD4TABS 463 03/14/2019  Lab Results  Component Value Date   HIV1RNAQUANT <20 NOT DETECTED 11/10/2019   No results found for: HEPBSAB Lab Results  Component Value Date   LABRPR NON-REACTIVE 11/10/2019    CBC Lab Results  Component Value Date   WBC 5.4 11/10/2019   RBC 4.03 11/10/2019   HGB 12.3 11/10/2019   HCT 37.1 11/10/2019   PLT 200 11/10/2019   MCV 92.1 11/10/2019   MCH 30.5 11/10/2019   MCHC 33.2 11/10/2019   RDW 13.0 11/10/2019   LYMPHSABS 1,539 11/10/2019   MONOABS 0.4 02/03/2019   EOSABS 103 11/10/2019    BMET Lab Results  Component Value Date   NA 141 01/04/2020   K 4.4 01/04/2020   CL 100 01/04/2020   CO2 34 (H) 01/04/2020   GLUCOSE 115 (H) 01/04/2020   BUN 18 01/04/2020   CREATININE 1.37 (H) 01/04/2020   CALCIUM 10.4 (H) 01/04/2020   GFRNONAA 40 (L) 01/04/2020   GFRAA 47 (L) 01/04/2020      Assessment and Plan  Asthma exacerbaton = Finish azithromycin with prednidsone  hiv disease = continue with biktarvy. She is well controlled  ckd 3 =  stable  New lung nodule with + pet scan = reiterated that we take step by step. All her providers will be coordinating her care. To plan to see pulmonary on Monday to decide how best to get tissue diagnosis. Continue to provide her with support. She doesn't have family members in the area.  mon pulm- will evaluated tues thyroid u/s, labwork - see dr Inda Merlin from onc

## 2020-02-05 ENCOUNTER — Telehealth: Payer: Self-pay | Admitting: Pulmonary Disease

## 2020-02-05 ENCOUNTER — Encounter: Payer: Self-pay | Admitting: Pulmonary Disease

## 2020-02-05 ENCOUNTER — Ambulatory Visit (INDEPENDENT_AMBULATORY_CARE_PROVIDER_SITE_OTHER): Payer: Medicare Other | Admitting: Pulmonary Disease

## 2020-02-05 ENCOUNTER — Other Ambulatory Visit: Payer: Self-pay

## 2020-02-05 VITALS — BP 130/90 | HR 93 | Temp 98.0°F | Ht 63.5 in | Wt 231.0 lb

## 2020-02-05 DIAGNOSIS — J4541 Moderate persistent asthma with (acute) exacerbation: Secondary | ICD-10-CM | POA: Diagnosis not present

## 2020-02-05 DIAGNOSIS — J9611 Chronic respiratory failure with hypoxia: Secondary | ICD-10-CM | POA: Diagnosis not present

## 2020-02-05 DIAGNOSIS — E041 Nontoxic single thyroid nodule: Secondary | ICD-10-CM

## 2020-02-05 DIAGNOSIS — R911 Solitary pulmonary nodule: Secondary | ICD-10-CM | POA: Diagnosis not present

## 2020-02-05 MED ORDER — FLUTICASONE FUROATE-VILANTEROL 200-25 MCG/INH IN AEPB: 1.0000 | INHALATION_SPRAY | Freq: Every day | RESPIRATORY_TRACT | 5 refills | Status: DC

## 2020-02-05 NOTE — Progress Notes (Signed)
Subjective:    Patient ID: Shannon [name], female    DOB: 09/21/1955, 64 y.o.   MRN: [id number]  HPI  64 yo never smoker for FU severe persistent  asthmaand nocturnal hypoxemia on O2 1-2 L/m .  She may have some degree of fixed obstruction PMH  - HIV dz f/by ID clinic- CD4 669 10/2019, lipodystrophy , atrial flutter on Xarelto  Meds -Symbicort makes her gag. She has various problems with other medications. Prednisone causes weight gain and increase in appetite  Chief Complaint  Patient presents with  . Follow-up    SOB has worsted since last visit. productive cough with green sputum. Current on Z pack and prednisone.   Last visit with me was 05/2018, she was supposed to start on allergy shots but did not. Last visit with APP was 12/2019, seems like she was noncompliant with [facility] and was asked to start this again, she just does not like to take inhalers Due to persistent shortness of breath lower extremity edema, chest x-ray was obtained 01/04/2020 which showed left perihilar opacity which was new compared to prior imaging  I personally reviewed imaging studies CT chest 01/10/2020 masslike consolidation in the left upper lobe measuring 2.7 x 1.9 x 1.9 cm PET scan showed hypermetabolism in this lesion and also in a 9 mm left axillary lymph node and in the left thyroid nodule  She was treated for shortness of breath with prednisone and Z-Pak.  She again admits not using her Breo.  She is very distraught regarding this, I reviewed her recent ID and PCP visits.  She is very anxious about findings noted on CT and possibility of cancer She has been scheduled for ultrasound of her thyroid and oncology consultation tomorrow  Significant tests/ events reviewed  CT chest 12/2019 >> Significant increase in size of left upper lobe masslike consolidation measuring approximately 2.7 x 1.9 x 1.9 cm with adjacent atelectasis/consolidation.  10/2019 that revealed LVEF 60 to 65% with mild LVH  and grade 1 diastolic dysfunction. The ascending aorta was normal in size.  CTa chest 12/2016 , 01/2017 >neg for PE , multifocal scarring bilaterally PFT (12/04/15) FEV1/FVC 50%, FEV1 0.64 31%.+++BD response. DLCO 66%. Spirometry 09/2017-ratio 59, FEV1 24%, FVC 32% Spirometry 05/2018 ratio 55, FEV1 28%, FVC 38%   Sleep study in 04/2016 was (-) for OSA    Past Medical History:  Diagnosis Date  . Anemia   . Anxiety    HX PANIC ATTACKS  . Arthritis    "starting to; in my hands" (07/09/2015)  . Asthma   . Atrial fibrillation ([place])   . Atrial flutter, paroxysmal ([place])   . Bloated abdomen   . CFS (chronic fatigue syndrome)   . Chewing difficulty   . Chronic asthma with acute exacerbation    "I have chronic asthma all the time; sometimes exacerbations" (07/09/2015)  . Chronic lower back pain   . COPD (chronic obstructive pulmonary disease) ([place])   . Cyst of right kidney    "3 of them; dx'd in ~ 01/2015"  . Dyspnea   . GERD (gastroesophageal reflux disease)   . Heart murmur   . History of blood transfusion    "related to my brain surgery I think"  . History of pulmonary embolism 07/09/2015  . HIV antibody positive ([place])   . HIV disease ([place])   . Hyperlipidemia   . Hypertension   . Leg edema   . Lipodystrophy   . Mild CAD 2013  . Osteopenia   .  Palpitations   . Pneumonia 07/09/2015  . Shingles   . Sleep apnea    "never completed part 2 of study; never wore mask" (07/09/2015)  . Vitamin B 12 deficiency   . Vitamin D deficiency      Past Surgical History:  Procedure Laterality Date  . ABDOMINAL HYSTERECTOMY     "robotic laparosopic"  . BRAIN SURGERY  1974   "brain tumor; benign; on top of my brain; got a plate in there"  . CARDIAC CATHETERIZATION    . TONSILLECTOMY AND ADENOIDECTOMY      Review of Systems neg for any significant sore throat, dysphagia, itching, sneezing, nasal congestion or excess/ purulent secretions, fever, chills, sweats, unintended wt  loss, pleuritic or exertional cp, hempoptysis, orthopnea pnd or change in chronic leg swelling. Also denies presyncope, palpitations, heartburn, abdominal pain, nausea, vomiting, diarrhea or change in bowel or urinary habits, dysuria,hematuria, rash, arthralgias, visual complaints, headache, numbness weakness or ataxia.     Objective:   Physical Exam  Gen. Pleasant, obese, in no distress ENT - no lesions, no post nasal drip Neck: No JVD, no thyromegaly, no carotid bruits Lungs: no use of accessory muscles, no dullness to percussion, decreased BS BL without rales or rhonchi  Cardiovascular: Rhythm regular, heart sounds  normal, no murmurs or gallops, no peripheral edema Musculoskeletal: No deformities, no cyanosis or clubbing , no tremors        Assessment & Plan:

## 2020-02-05 NOTE — Telephone Encounter (Signed)
Attempted to call pt but unable to reach. Left message for her to return call. 

## 2020-02-05 NOTE — Assessment & Plan Note (Addendum)
Hypermetabolic on PET and high risk of malignancy even though she is a never smoker, due to HIV. She does have a bronchus leading to the mass so navigation guided bronchoscopy possible.  Obviously Xarelto will be held prior to the procedure, would prefer to have thyroid nodule biopsied before proceeding with lung biopsy.  She will need general anesthesia and will need cardiac clearance  The various options of biopsy including bronchoscopy, CT guided needle aspiration and surgical biopsy were discussed.The risks of each procedure including coughing, bleeding and the  chances of lung puncture requiring chest tube were discussed in great detail. The benefits & alternatives including serial follow up were also discussed.

## 2020-02-05 NOTE — Assessment & Plan Note (Signed)
Await results of thyroid ultrasound -you may need biopsy of thyroid nodule.

## 2020-02-05 NOTE — Assessment & Plan Note (Signed)
Finish course of prednisone. Resume taking the Breo daily, we will send in prescription.  She has been resistant to taking this in the past.  I once again clarified the difference between maintenance and rescue inhaler need for her to take maintenance inhaler.  At the same time her obstruction seems to be fairly fixed and by physiology she is behaving more like COPD rather than asthma  With her low lung function I'm not sure that she'll be able to tolerate a resection procedure

## 2020-02-05 NOTE — H&P (View-Only) (Signed)
Subjective:    Patient ID: Shannon [name], female    DOB: [birth year], 64 y.o.   MRN: [id number]  HPI  64 yo never smoker for FU severe persistent  asthmaand nocturnal hypoxemia on O2 1-2 L/m .  She may have some degree of fixed obstruction PMH  - HIV dz f/by ID clinic- CD4 669 10/2019, lipodystrophy , atrial flutter on Xarelto  Meds -Symbicort makes her gag. She has various problems with other medications. Prednisone causes weight gain and increase in appetite  Chief Complaint  Patient presents with  . Follow-up    SOB has worsted since last visit. productive cough with green sputum. Current on Z pack and prednisone.   Last visit with me was 05/2018, she was supposed to start on allergy shots but did not. Last visit with APP was 12/2019, seems like she was noncompliant with [facility] and was asked to start this again, she just does not like to take inhalers Due to persistent shortness of breath lower extremity edema, chest x-ray was obtained 01/04/2020 which showed left perihilar opacity which was new compared to prior imaging  I personally reviewed imaging studies CT chest 01/10/2020 masslike consolidation in the left upper lobe measuring 2.7 x 1.9 x 1.9 cm PET scan showed hypermetabolism in this lesion and also in a 9 mm left axillary lymph node and in the left thyroid nodule  She was treated for shortness of breath with prednisone and Z-Pak.  She again admits not using her Breo.  She is very distraught regarding this, I reviewed her recent ID and PCP visits.  She is very anxious about findings noted on CT and possibility of cancer She has been scheduled for ultrasound of her thyroid and oncology consultation tomorrow  Significant tests/ events reviewed  CT chest 12/2019 >> Significant increase in size of left upper lobe masslike consolidation measuring approximately 2.7 x 1.9 x 1.9 cm with adjacent atelectasis/consolidation.  10/2019 that revealed LVEF 60 to 65% with mild LVH  and grade 1 diastolic dysfunction. The ascending aorta was normal in size.  CTa chest 12/2016 , 01/2017 >neg for PE , multifocal scarring bilaterally PFT (12/04/15) FEV1/FVC 50%, FEV1 0.64 31%.+++BD response. DLCO 66%. Spirometry 09/2017-ratio 59, FEV1 24%, FVC 32% Spirometry 05/2018 ratio 55, FEV1 28%, FVC 38%   Sleep study in 04/2016 was (-) for OSA    Past Medical History:  Diagnosis Date  . Anemia   . Anxiety    HX PANIC ATTACKS  . Arthritis    "starting to; in my hands" (07/09/2015)  . Asthma   . Atrial fibrillation ([place])   . Atrial flutter, paroxysmal ([place])   . Bloated abdomen   . CFS (chronic fatigue syndrome)   . Chewing difficulty   . Chronic asthma with acute exacerbation    "I have chronic asthma all the time; sometimes exacerbations" (07/09/2015)  . Chronic lower back pain   . COPD (chronic obstructive pulmonary disease) ([place])   . Cyst of right kidney    "3 of them; dx'd in ~ 01/2015"  . Dyspnea   . GERD (gastroesophageal reflux disease)   . Heart murmur   . History of blood transfusion    "related to my brain surgery I think"  . History of pulmonary embolism 07/09/2015  . HIV antibody positive ([place])   . HIV disease ([place])   . Hyperlipidemia   . Hypertension   . Leg edema   . Lipodystrophy   . Mild CAD 2013  . Osteopenia   .  Palpitations   . Pneumonia 07/09/2015  . Shingles   . Sleep apnea    "never completed part 2 of study; never wore mask" (07/09/2015)  . Vitamin B 12 deficiency   . Vitamin D deficiency      Past Surgical History:  Procedure Laterality Date  . ABDOMINAL HYSTERECTOMY     "robotic laparosopic"  . BRAIN SURGERY  1974   "brain tumor; benign; on top of my brain; got a plate in there"  . CARDIAC CATHETERIZATION    . TONSILLECTOMY AND ADENOIDECTOMY      Review of Systems neg for any significant sore throat, dysphagia, itching, sneezing, nasal congestion or excess/ purulent secretions, fever, chills, sweats, unintended wt  loss, pleuritic or exertional cp, hempoptysis, orthopnea pnd or change in chronic leg swelling. Also denies presyncope, palpitations, heartburn, abdominal pain, nausea, vomiting, diarrhea or change in bowel or urinary habits, dysuria,hematuria, rash, arthralgias, visual complaints, headache, numbness weakness or ataxia.     Objective:   Physical Exam  Gen. Pleasant, obese, in no distress ENT - no lesions, no post nasal drip Neck: No JVD, no thyromegaly, no carotid bruits Lungs: no use of accessory muscles, no dullness to percussion, decreased BS BL without rales or rhonchi  Cardiovascular: Rhythm regular, heart sounds  normal, no murmurs or gallops, no peripheral edema Musculoskeletal: No deformities, no cyanosis or clubbing , no tremors        Assessment & Plan:

## 2020-02-05 NOTE — Telephone Encounter (Signed)
Let us schedule biopsy procedures first and then we can schedule follow-up accordingly

## 2020-02-05 NOTE — Patient Instructions (Addendum)
  Finish course of prednisone. Start back taking the [place] daily, we will send in prescription.  Await results of thyroid ultrasound -you may need biopsy of thyroid nodule.   we will proceed with setting up biopsy of lung nodule via bronchoscopy.  You will need to be put to sleep for this procedure

## 2020-02-05 NOTE — Telephone Encounter (Signed)
Dr. Elsworth Soho, please advise on whether we can work her into your schedule or if she should see an APP sooner.

## 2020-02-05 NOTE — Telephone Encounter (Signed)
Pt is concerned about not seeing Dr. Elsworth Soho before her surgery. Today's AVS said to f/u in 4wks but his next  open appt isn't until Aug 23rd. Offered sooner appt in [place] w/ Dr. Elsworth Soho. Pt declined. Offered pt w/ PA pt accepted but wanted to reach out to Dr. Elsworth Soho b/c she is very concerned about surgery and would rather see him. Please advise.

## 2020-02-06 ENCOUNTER — Ambulatory Visit (HOSPITAL_COMMUNITY)
Admission: RE | Admit: 2020-02-06 | Discharge: 2020-02-06 | Disposition: A | Discharge status: Home / Self Care | Payer: Medicare Other | Source: Ambulatory Visit | Attending: Internal Medicine | Admitting: Internal Medicine

## 2020-02-06 ENCOUNTER — Inpatient Hospital Stay: Payer: Medicare Other | Attending: Internal Medicine | Admitting: Internal Medicine

## 2020-02-06 ENCOUNTER — Inpatient Hospital Stay: Payer: Medicare Other

## 2020-02-06 ENCOUNTER — Encounter: Payer: Self-pay | Admitting: Internal Medicine

## 2020-02-06 ENCOUNTER — Other Ambulatory Visit: Payer: Self-pay

## 2020-02-06 DIAGNOSIS — I4891 Unspecified atrial fibrillation: Secondary | ICD-10-CM | POA: Diagnosis not present

## 2020-02-06 DIAGNOSIS — R911 Solitary pulmonary nodule: Secondary | ICD-10-CM | POA: Diagnosis not present

## 2020-02-06 DIAGNOSIS — E041 Nontoxic single thyroid nodule: Secondary | ICD-10-CM | POA: Diagnosis not present

## 2020-02-06 DIAGNOSIS — Z21 Asymptomatic human immunodeficiency virus [HIV] infection status: Secondary | ICD-10-CM | POA: Insufficient documentation

## 2020-02-06 DIAGNOSIS — J45909 Unspecified asthma, uncomplicated: Secondary | ICD-10-CM | POA: Diagnosis not present

## 2020-02-06 DIAGNOSIS — R519 Headache, unspecified: Secondary | ICD-10-CM | POA: Diagnosis not present

## 2020-02-06 DIAGNOSIS — D649 Anemia, unspecified: Secondary | ICD-10-CM | POA: Diagnosis not present

## 2020-02-06 DIAGNOSIS — R0602 Shortness of breath: Secondary | ICD-10-CM

## 2020-02-06 DIAGNOSIS — R079 Chest pain, unspecified: Secondary | ICD-10-CM | POA: Diagnosis not present

## 2020-02-06 DIAGNOSIS — R05 Cough: Secondary | ICD-10-CM

## 2020-02-06 DIAGNOSIS — Z86718 Personal history of other venous thrombosis and embolism: Secondary | ICD-10-CM

## 2020-02-06 DIAGNOSIS — I10 Essential (primary) hypertension: Secondary | ICD-10-CM | POA: Diagnosis not present

## 2020-02-06 DIAGNOSIS — C3412 Malignant neoplasm of upper lobe, left bronchus or lung: Secondary | ICD-10-CM | POA: Insufficient documentation

## 2020-02-06 DIAGNOSIS — E042 Nontoxic multinodular goiter: Secondary | ICD-10-CM | POA: Diagnosis not present

## 2020-02-06 DIAGNOSIS — F1211 Cannabis abuse, in remission: Secondary | ICD-10-CM

## 2020-02-06 DIAGNOSIS — R110 Nausea: Secondary | ICD-10-CM | POA: Insufficient documentation

## 2020-02-06 DIAGNOSIS — M8580 Other specified disorders of bone density and structure, unspecified site: Secondary | ICD-10-CM

## 2020-02-06 LAB — CBC WITH DIFFERENTIAL (CANCER CENTER ONLY)
Abs Immature Granulocytes: 0.01 10*3/uL (ref 0.00–0.07)
Basophils Absolute: 0 10*3/uL (ref 0.0–0.1)
Basophils Relative: 1 %
Eosinophils Absolute: 0.1 10*3/uL (ref 0.0–0.5)
Eosinophils Relative: 2 %
HCT: 42 % (ref 36.0–46.0)
Hemoglobin: 13.2 g/dL (ref 12.0–15.0)
Immature Granulocytes: 0 %
Lymphocytes Relative: 25 %
Lymphs Abs: 1.6 10*3/uL (ref 0.7–4.0)
MCH: 30.6 pg (ref 26.0–34.0)
MCHC: 31.4 g/dL (ref 30.0–36.0)
MCV: 97.4 fL (ref 80.0–100.0)
Monocytes Absolute: 0.5 10*3/uL (ref 0.1–1.0)
Monocytes Relative: 7 %
Neutro Abs: 4.2 10*3/uL (ref 1.7–7.7)
Neutrophils Relative %: 65 %
Platelet Count: 208 10*3/uL (ref 150–400)
RBC: 4.31 MIL/uL (ref 3.87–5.11)
RDW: 12.7 % (ref 11.5–15.5)
WBC Count: 6.3 10*3/uL (ref 4.0–10.5)
nRBC: 0 % (ref 0.0–0.2)

## 2020-02-06 LAB — CMP (CANCER CENTER ONLY)
ALT: 18 U/L (ref 0–44)
AST: 22 U/L (ref 15–41)
Albumin: 3.9 g/dL (ref 3.5–5.0)
Alkaline Phosphatase: 79 U/L (ref 38–126)
Anion gap: 7 (ref 5–15)
BUN: 22 mg/dL (ref 8–23)
CO2: 35 mmol/L — ABNORMAL HIGH (ref 22–32)
Calcium: 10.4 mg/dL — ABNORMAL HIGH (ref 8.9–10.3)
Chloride: 98 mmol/L (ref 98–111)
Creatinine: 1.64 mg/dL — ABNORMAL HIGH (ref 0.44–1.00)
GFR, Est AFR Am: 38 mL/min — ABNORMAL LOW (ref >60)
GFR, Estimated: 32 mL/min — ABNORMAL LOW (ref >60)
Glucose, Bld: 105 mg/dL — ABNORMAL HIGH (ref 70–99)
Potassium: 4.6 mmol/L (ref 3.5–5.1)
Sodium: 140 mmol/L (ref 135–145)
Total Bilirubin: 0.5 mg/dL (ref 0.3–1.2)
Total Protein: 7.3 g/dL (ref 6.5–8.1)

## 2020-02-06 NOTE — Progress Notes (Addendum)
[place] [phone]   [phone]  CONSULT NOTE  REFERRING PHYSICIAN: Dr. Kara Mead  REASON FOR CONSULTATION:  64 years old African-American female with suspicious lung cancer.  HPI Shannon [name] is a 64 y.o. female with past medical history significant for multiple medical problems including history of asthma, atrial fibrillation, anemia, and anxiety, GERD, HIV, dyslipidemia, hypertension, osteopia as well as history of DVT.  The patient mentions that she is a never smoker.  She has been complaining of shortness of breath for over 2 years and she is followed by Dr. Elsworth Soho from pulmonary medicine and treated for asthma.  She is also followed by cardiology for the atrial fibrillation.  She also had dental issues and was seen by orthodontic and had some dental infection.  She was supposed to get dental extraction but they requested medical clearance.  The patient was seen by her primary care physician and chest x-ray was performed on Jan 04, 2020 and it showed nodular opacity in the left mid lung lateral to the left hilum measuring 2.5 x 1.9 cm increased in size compared to November 2019.  This was followed by CT scan of the chest without contrast on Jan 10, 2020 and it showed significant increase in the size of the left upper lobe masslike consolidation measuring 2.7 x 1.9 x 1.9 cm with adjacent atelectasis/consolidation.  The patient had a PET scan on January 25, 2020 and it showed 2.7 cm left upper lobe pulmonary mass, hypermetabolic with SUV max of 7.1.  There was low-level hypermetabolism identified in the left axillary node measuring 0.7 cm in short axis probably secondary to her recent Covid vaccination.  There was also hypermetabolism in the left thyroid lobe apparently associated with a thyroid nodule.  The patient had ultrasound of the thyroid performed earlier today and the findings were suggestive of multinodular goiter.  The nodule #5 correlating with  the hyper metabolic nodule seen on the previous PET needs imaging criteria to recommend percutaneous sampling.  The patient was seen by Dr. Elsworth Soho yesterday and he is planning bronchoscopy for tissue diagnosis.  She was referred to me today for evaluation. When seen today the patient continues to have the shortness of breath.  She also complaining of right sided chest pain for the last 2 weeks with dry cough.  She denied having any hemoptysis.  She denied having any recent weight loss or night sweats.  She has intermittent nausea and occasional headache with questionable postnasal drainage.  The patient denied having any fever or chills. Family history significant for mother with thyroid disorder as well as congestive heart failure.  Sister also had thyroid issues. The patient is single and has no children.  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 She has no history for smoking but drinks alcohol occasionally and also she has a history of marijuana abuse.   HPI  Past Medical History:  Diagnosis Date  . Anemia   . Anxiety    HX PANIC ATTACKS  . Arthritis    "starting to; in my hands" (07/09/2015)  . Asthma   . Atrial fibrillation ([place])   . Atrial flutter, paroxysmal ([place])   . Bloated abdomen   . CFS (chronic fatigue syndrome)   . Chewing difficulty   . Chronic asthma with acute exacerbation    "I have chronic asthma all the time; sometimes exacerbations" (07/09/2015)  . Chronic lower back  pain   . COPD (chronic obstructive pulmonary disease) ([place])   . Cyst of right kidney    "3 of them; dx'd in ~ 01/2015"  . Dyspnea   . GERD (gastroesophageal reflux disease)   . Heart murmur   . History of blood transfusion    "related to my brain surgery I think"  . History of pulmonary embolism 07/09/2015  . HIV antibody positive ([place])   . HIV disease ([place])   . Hyperlipidemia   . Hypertension   . Leg edema   . Lipodystrophy     . Mild CAD 2013  . Osteopenia   . Palpitations   . Pneumonia 07/09/2015  . Shingles   . Sleep apnea    "never completed part 2 of study; never wore mask" (07/09/2015)  . Vitamin B 12 deficiency   . Vitamin D deficiency     Past Surgical History:  Procedure Laterality Date  . ABDOMINAL HYSTERECTOMY     "robotic laparosopic"  . BRAIN SURGERY  1974   "brain tumor; benign; on top of my brain; got a plate in there"  . CARDIAC CATHETERIZATION    . TONSILLECTOMY AND ADENOIDECTOMY      Family History  Problem Relation Age of Onset  . Asthma Mother   . Heart failure Mother        cardiomyopathy  . Thyroid disease Mother   . Heart disease Mother   . Obesity Mother   . Hypertension Mother   . Heart murmur Sister   . Heart murmur Brother   . Diabetes Sister   . Thyroid disease Sister   . Heart murmur Sister     Social History Social History   Tobacco Use  . Smoking status: Never Smoker  . Smokeless tobacco: Never Used  Vaping Use  . Vaping Use: Never used  Substance Use Topics  . Alcohol use: Yes    Alcohol/week: 0.0 standard drinks    Comment: Rarely.  . Drug use: No    Allergies  Allergen Reactions  . Tree Extract Swelling and Other (See Comments)    Swelling to eyes  . Augmentin [Amoxicillin-Pot Clavulanate] Other (See Comments)    Headache, dizzy  . Lisinopril Cough    Face/throat swelling  . Ciprofloxacin Hives    Current Outpatient Medications  Medication Sig Dispense Refill  . acetaminophen (TYLENOL) 325 MG tablet Take 325 mg by mouth every 6 (six) hours as needed (FOR PAIN).    [name] albuterol (PROVENTIL) (2.5 MG/3ML) 0.083% nebulizer solution USE 1 VIAL VIA NEBULIZER EVERY 6 HOURS AS NEEDED FOR WHEEZING OR SHORTNESS OF BREATH 75 mL 6  . Ascorbic Acid (VITAMIN C) 1000 MG tablet Take 1,000 mg by mouth 2 (two) times a week.     . bictegravir-emtricitabine-tenofovir AF (BIKTARVY) 50-200-25 MG TABS tablet Take 1 tablet by mouth daily. 30 tablet 5  . BIOTIN  PO Take 1 tablet by mouth daily as needed (for supplementation).     . Cholecalciferol (SM VITAMIN D3) 100 MCG (4000 UT) CAPS Take 1 capsule (4,000 Units total) by mouth daily. 30 capsule 0  . cloNIDine (CATAPRES - DOSED IN MG/24 HR) 0.1 mg/24hr patch Place 3 patches (0.3 mg total) onto the skin once a week. 4 patch 0  . cyanocobalamin (,VITAMIN B-12,) 1000 MCG/ML injection ADMINISTER 1 ML(1000 MCG) IN THE MUSCLE EVERY 30 DAYS 1 mL 3  . diltiazem (CARDIZEM CD) 240 MG 24 hr capsule TAKE 1 CAPSULE(240 MG) BY MOUTH DAILY 90 capsule 3  .  fluticasone (FLONASE) 50 MCG/ACT nasal spray Place 2 sprays into both nostrils daily. (Patient taking differently: Place 2 sprays into both nostrils daily as needed for allergies. ) 16 g 2  . fluticasone furoate-vilanterol (BREO ELLIPTA) 200-25 MCG/INH AEPB Inhale 1 puff into the lungs daily. 1 each 5  . furosemide (LASIX) 40 MG tablet Take 1 tablet (40 mg total) by mouth daily. 90 tablet 0  . MAGNESIUM PO Take by mouth every other day.    . Melatonin 5 MG TABS Take 5 mg by mouth at bedtime as needed (for sleep).    . Misc. Devices (PULSE OXIMETER FOR FINGER) MISC 1 Units by Does not apply route as needed. 1 each 0  . Mouthwashes (BIOTENE DRY MOUTH GENTLE) LIQD Use as directed 1 Dose in the mouth or throat 2 (two) times daily. 1 Bottle 0  . omeprazole (PRILOSEC) 40 MG capsule TAKE 1 CAPSULE(40 MG) BY MOUTH DAILY 90 capsule 1  . OXYGEN Inhale 1.5-2 L into the lungs continuous.     . polyethylene glycol (MIRALAX / GLYCOLAX) packet Take 17 g by mouth daily. (Patient taking differently: Take 17 g by mouth daily as needed for mild constipation. ) 14 each 0  . PREVIDENT 5000 SENSITIVE 1.1-5 % PSTE Use daily as directed  1  . PROAIR HFA 108 (90 Base) MCG/ACT inhaler INHALE 2 PUFFS INTO THE LUNGS EVERY 4 HOURS AS NEEDED FOR WHEEZING OR SHORTNESS OF BREATH 17 g 6  . spironolactone (ALDACTONE) 25 MG tablet Take 1 tablet (25 mg total) by mouth daily. 90 tablet 1  .  SYRINGE/NEEDLE, DISP, 1 ML (B-D SYRINGE/NEEDLE 1CC/25GX5/8) 25G X 5/8" 1 ML MISC 1 Units by Does not apply route every 30 (thirty) days. 50 each 0  . XARELTO 20 MG TABS tablet TAKE 1 TABLET(20 MG) BY MOUTH DAILY WITH SUPPER 90 tablet 1   No current facility-administered medications for this visit.    Review of Systems  Constitutional: positive for fatigue Eyes: negative Ears, nose, mouth, throat, and face: negative Respiratory: positive for cough and dyspnea on exertion Cardiovascular: negative Gastrointestinal: negative Genitourinary:negative Integument/breast: negative Hematologic/lymphatic: negative Musculoskeletal:negative Neurological: negative Behavioral/Psych: negative Endocrine: negative Allergic/Immunologic: negative  Physical Exam  JHE:RDEYC, healthy, no distress, well nourished, well developed and anxious SKIN: skin color, texture, turgor are normal, no rashes or significant lesions HEAD: Normocephalic, No masses, lesions, tenderness or abnormalities EYES: normal, PERRLA, Conjunctiva are pink and non-injected EARS: External ears normal, Canals clear OROPHARYNX:no exudate, no erythema and lips, buccal mucosa, and tongue normal  NECK: supple, no adenopathy, no JVD LYMPH:  no palpable lymphadenopathy, no hepatosplenomegaly BREAST:not examined LUNGS: decreased breath sounds, prolonged expiratory phase HEART: regular rate & rhythm, no murmurs and no gallops ABDOMEN:abdomen soft, non-tender, obese, normal bowel sounds and no masses or organomegaly BACK: No CVA tenderness, Range of motion is normal EXTREMITIES:no joint deformities, effusion, or inflammation, no edema  NEURO: alert & oriented x 3 with fluent speech, no focal motor/sensory deficits  PERFORMANCE STATUS: ECOG 1  LABORATORY DATA: Lab Results  Component Value Date   WBC 6.3 02/06/2020   HGB 13.2 02/06/2020   HCT 42.0 02/06/2020   MCV 97.4 02/06/2020   PLT 208 02/06/2020      Chemistry       Component Value Date/Time   NA 140 02/06/2020 1345   NA 145 (H) 02/23/2019 1100   K 4.6 02/06/2020 1345   CL 98 02/06/2020 1345   CO2 35 (H) 02/06/2020 1345   BUN 22 02/06/2020  1345   BUN 17 02/23/2019 1100   CREATININE 1.64 (H) 02/06/2020 1345   CREATININE 1.35 (H) 11/10/2019 1027      Component Value Date/Time   CALCIUM 10.4 (H) 02/06/2020 1345   ALKPHOS 79 02/06/2020 1345   AST 22 02/06/2020 1345   ALT 18 02/06/2020 1345   BILITOT 0.5 02/06/2020 1345       RADIOGRAPHIC STUDIES: CT Chest Wo Contrast  Result Date: 01/10/2020 CLINICAL DATA:  Left lung nodule EXAM: CT CHEST WITHOUT CONTRAST TECHNIQUE: Multidetector CT imaging of the chest was performed following the standard protocol without IV contrast. COMPARISON:  CT chest 01/28/2017 FINDINGS: Cardiovascular: Normal heart size. Coronary artery calcification. No pericardial effusion. Mediastinum/Nodes: No enlarged lymph nodes identified on this noncontrast study. Lungs/Pleura: Significant increase in size of left upper lobe masslike consolidation measuring approximately 2.7 x 1.9 x 1.9 cm with adjacent atelectasis/consolidation. There is mild cylindrical bronchiectasis and peribronchial thickening. No pleural effusion or pneumothorax. Upper Abdomen: No acute abnormality. Musculoskeletal: Degenerative changes of the included spine. IMPRESSION: Masslike consolidation in the left upper lobe with adjacent atelectasis/consolidation suspicious for malignancy. PET-CT and/or tissue sampling recommended. These results will be called to the ordering clinician or representative by the Radiologist Assistant, and communication documented in the PACS or [employer]. Electronically Signed   By: Macy Mis M.D.   On: 01/10/2020 13:46   NM PET Image Initial (PI) Skull Base To Thigh  Result Date: 01/26/2020 CLINICAL DATA:  Initial treatment strategy for pulmonary nodule. EXAM: NUCLEAR MEDICINE PET SKULL BASE TO THIGH TECHNIQUE: 11.9 mCi F-18 FDG  was injected intravenously. Full-ring PET imaging was performed from the skull base to thigh after the radiotracer. CT data was obtained and used for attenuation correction and anatomic localization. Fasting blood glucose: 122 mg/dl COMPARISON:  CT chest 01/10/2020 FINDINGS: Mediastinal blood pool activity: SUV max 3.1 Liver activity: SUV max NA NECK: Asymmetric hypermetabolism identified in the posterior right nasopharynx, in the region of the fossa of Rosenmuller. SUV max = 9.0. No mass lesion evident on CT imaging. Uptake in the vocal cords may be related to patient speaking after radiopharmaceutical injection. Focal uptake left thyroid lobe with SUV max = 8.2. Asymmetric enlargement of the left thyroid parenchyma noted by noncontrast CT with potential 3.2 cm left thyroid nodule on image 42/4. Incidental CT findings: none CHEST: 2.7 cm left upper lobe pulmonary mass seen on recent CT is hypermetabolic with SUV max = 7.1. Mottled uptake identified in the mediastinum and both hilar regions without definite hypermetabolic lymphadenopathy. Low level hypermetabolism identified in a left axillary node measuring 7 mm short axis on image 63/4. By report, the patient had recent vaccination in the right upper extremity. While metastatic disease to the left axilla would be somewhat unusual, close follow-up recommended. Incidental CT findings: Coronary artery calcification is evident. Atherosclerotic calcification is noted in the wall of the thoracic aorta. ABDOMEN/PELVIS: No abnormal hypermetabolic activity within the liver, pancreas, adrenal glands, or spleen. No hypermetabolic lymph nodes in the abdomen or pelvis. Incidental CT findings: Multiple tiny bilateral renal lesions of varying size and attenuation are indeterminate but likely reflect a combination of simple and complex cysts. Atherosclerotic calcification noted in the abdominal aorta. Small umbilical hernia contains only fat. SKELETON: No focal hypermetabolic  activity to suggest skeletal metastasis. Incidental CT findings: No worrisome lytic or sclerotic osseous abnormality. IMPRESSION: 1. Left upper lobe pulmonary lesion is hypermetabolic consistent with primary bronchogenic neoplasm. No definite hypermetabolic metastatic disease in the left hilum or mediastinum.  2. I enlarged but mildly hypermetabolic left axillary node. Patient reportedly had recent COVID vaccination in the contralateral upper extremity. This would be somewhat atypical for metastatic lung cancer, but close attention on follow-up recommended. 3. Fairly marked asymmetric hypermetabolism in the posterior right nasopharynx, in the region of the fossa of Rosenmuller. No soft tissue mass lesion evident on CT imaging, but direct visualization recommended to exclude mucosal lesion. 4. Hypermetabolism in the left thyroid lobe apparently associated with thyroid nodule. Recommend thyroid US and biopsy. (Ref: J Am Coll Radiol. 2015 Feb;12(2): 143-50). 5. Multiple bilateral renal lesions of varying size and attenuation. MRI of 03/31/2019 characterized bilateral Bosniak I and II cysts. 6.  Aortic Atherosclerois (ICD10-170.0) Electronically Signed   By: Misty Stanley M.D.   On: 01/26/2020 09:23    ASSESSMENT: This is a 64 years old African-American female with highly suspicious stage Ia (T1c, N0, M0) lung cancer presented with left upper lobe pulmonary nodule pending further staging work-up and tissue diagnosis.   PLAN: I had a lengthy discussion with the patient today about her current condition and further investigation to confirm her diagnosis as well as possible treatment options. I recommended for the patient to proceed with the bronchoscopy as planned by Dr. Elsworth Soho. If the tissue biopsy confirmed the presence of malignancy, will need to complete the staging work-up by ordering MRI of the brain to rule out brain metastasis before considering the patient for surgical resection or SBRT. Depending on her  pulmonary function if the final pathology is consistent with malignancy, she will see cardiothoracic surgery for evaluation and consideration of surgical resection if she is candidate for surgery otherwise she could be treated with SBRT to the left upper lobe pulmonary nodule. For the history of asthma she will continue her treatment under the care of Dr. Elsworth Soho. For the atrial fibrillation she is seen by Dr. Oval Linsey. For the HIV.  She is followed by Dr. Graylon Good. I will arrange for the patient a follow-up visit after her tissue diagnosis at the multidisciplinary thoracic oncology clinic. She was advised to call immediately if she has any concerning symptoms in the interval.  The patient voices understanding of current disease status and treatment options and is in agreement with the current care plan.  All questions were answered. The patient knows to call the clinic with any problems, questions or concerns. We can certainly see the patient much sooner if necessary.  Thank you so much for allowing me to participate in the care of Shannon [name]. I will continue to follow up the patient with you and assist in her care.  The total time spent in the appointment was 80 minutes.  Disclaimer: This note was dictated with voice recognition software. Similar sounding words can inadvertently be transcribed and may not be corrected upon review.   Eilleen Kempf February 06, 2020, 2:45 PM  ADDENDUM:  The patient called on 02/13/2020 to indicate that she does not abuse marijuana and she does not drink alcohol. This is a correction of the statement in the social history.

## 2020-02-06 NOTE — Progress Notes (Signed)
Internal Medicine Clinic Attending  Case discussed with Dr. Masoudi  at the time of the visit.  We reviewed the resident's history and exam and pertinent patient test results.  I agree with the assessment, diagnosis, and plan of care documented in the resident's note.  

## 2020-02-07 ENCOUNTER — Encounter: Payer: Self-pay | Admitting: Internal Medicine

## 2020-02-07 ENCOUNTER — Ambulatory Visit: Payer: Self-pay | Admitting: *Deleted

## 2020-02-07 ENCOUNTER — Other Ambulatory Visit: Payer: Self-pay | Admitting: Emergency Medicine

## 2020-02-07 ENCOUNTER — Ambulatory Visit (INDEPENDENT_AMBULATORY_CARE_PROVIDER_SITE_OTHER): Payer: Medicare Other | Admitting: Internal Medicine

## 2020-02-07 ENCOUNTER — Other Ambulatory Visit: Payer: Self-pay

## 2020-02-07 ENCOUNTER — Ambulatory Visit: Payer: Medicare Other | Admitting: *Deleted

## 2020-02-07 DIAGNOSIS — I10 Essential (primary) hypertension: Secondary | ICD-10-CM | POA: Diagnosis not present

## 2020-02-07 DIAGNOSIS — J4541 Moderate persistent asthma with (acute) exacerbation: Secondary | ICD-10-CM

## 2020-02-07 DIAGNOSIS — N183 Chronic kidney disease, stage 3 unspecified: Secondary | ICD-10-CM

## 2020-02-07 DIAGNOSIS — R911 Solitary pulmonary nodule: Secondary | ICD-10-CM | POA: Diagnosis not present

## 2020-02-07 DIAGNOSIS — J449 Chronic obstructive pulmonary disease, unspecified: Secondary | ICD-10-CM

## 2020-02-07 DIAGNOSIS — E041 Nontoxic single thyroid nodule: Secondary | ICD-10-CM | POA: Diagnosis not present

## 2020-02-07 DIAGNOSIS — J4489 Other specified chronic obstructive pulmonary disease: Secondary | ICD-10-CM

## 2020-02-07 DIAGNOSIS — B20 Human immunodeficiency virus [HIV] disease: Secondary | ICD-10-CM

## 2020-02-07 DIAGNOSIS — I2510 Atherosclerotic heart disease of native coronary artery without angina pectoris: Secondary | ICD-10-CM

## 2020-02-07 DIAGNOSIS — I5032 Chronic diastolic (congestive) heart failure: Secondary | ICD-10-CM

## 2020-02-07 DIAGNOSIS — G4734 Idiopathic sleep related nonobstructive alveolar hypoventilation: Secondary | ICD-10-CM

## 2020-02-07 NOTE — Assessment & Plan Note (Addendum)
She continues to have problems with breathing.  She reports that she is taking her medications, she is now taking the [facility] and occasional albuterol.  She needs better control and will plan to follow up with Dr. Elsworth Soho.

## 2020-02-07 NOTE — Assessment & Plan Note (Signed)
Could not check her BP today.  Will follow up at next visit.

## 2020-02-07 NOTE — Chronic Care Management (AMB) (Signed)
Chronic Care Management   Initial Visit Note  02/07/2020 Name: Shannon [name] MRN: [id number] DOB: 01-04-1956  Referred by: [name], MD Reason for referral : Chronic Care Management (asthma, COPD, HTN)   Shannon [name] is a 64 y.o. year old female who is a primary care patient of [name], MD. The CCM team was consulted for assistance with chronic disease management and care coordination needs related to CAD, HTN, HLD, COPD, CKD Stage 3, Anxiety and asthma  Review of patient status, including review of consultants reports, relevant laboratory and other test results, and collaboration with appropriate care team members and the patient's provider was performed as part of comprehensive patient evaluation and provision of chronic care management services.    SDOH (Social Determinants of Health) assessments performed: Yes See Care Plan activities for detailed interventions related to SDOH  SDOH Interventions     Most Recent Value  SDOH Interventions  Physical Activity Interventions Other (Comments)  [patient uses oxygen at hs and prn and becomes dyspneic with conversation so unable to exercise]  Transportation Interventions Intervention Not Indicated  [she uses [employer] and [employer] transportation]       Medications: Outpatient Encounter Medications as of 02/07/2020  Medication Sig  . acetaminophen (TYLENOL) 325 MG tablet Take 325 mg by mouth every 6 (six) hours as needed (FOR PAIN).  [name] albuterol (PROVENTIL) (2.5 MG/3ML) 0.083% nebulizer solution USE 1 VIAL VIA NEBULIZER EVERY 6 HOURS AS NEEDED FOR WHEEZING OR SHORTNESS OF BREATH  . Ascorbic Acid (VITAMIN C) 1000 MG tablet Take 1,000 mg by mouth 2 (two) times a week.   . bictegravir-emtricitabine-tenofovir AF (BIKTARVY) 50-200-25 MG TABS tablet Take 1 tablet by mouth daily.  [name] BIOTIN PO Take 1 tablet by mouth daily as needed (for supplementation).   . Cholecalciferol (SM VITAMIN D3) 100 MCG (4000 UT) CAPS Take 1 capsule (4,000  Units total) by mouth daily.  . cloNIDine (CATAPRES - DOSED IN MG/24 HR) 0.1 mg/24hr patch Place 3 patches (0.3 mg total) onto the skin once a week.  . cyanocobalamin (,VITAMIN B-12,) 1000 MCG/ML injection ADMINISTER 1 ML(1000 MCG) IN THE MUSCLE EVERY 30 DAYS  . diltiazem (CARDIZEM CD) 240 MG 24 hr capsule TAKE 1 CAPSULE(240 MG) BY MOUTH DAILY  . fluticasone (FLONASE) 50 MCG/ACT nasal spray Place 2 sprays into both nostrils daily. (Patient taking differently: Place 2 sprays into both nostrils daily as needed for allergies. )  . fluticasone furoate-vilanterol (BREO ELLIPTA) 200-25 MCG/INH AEPB Inhale 1 puff into the lungs daily.  . furosemide (LASIX) 40 MG tablet Take 1 tablet (40 mg total) by mouth daily.  [name] MAGNESIUM PO Take by mouth every other day.  . Melatonin 5 MG TABS Take 5 mg by mouth at bedtime as needed (for sleep).  . Misc. Devices (PULSE OXIMETER FOR FINGER) MISC 1 Units by Does not apply route as needed.  . Mouthwashes (BIOTENE DRY MOUTH GENTLE) LIQD Use as directed 1 Dose in the mouth or throat 2 (two) times daily.  [name] omeprazole (PRILOSEC) 40 MG capsule TAKE 1 CAPSULE(40 MG) BY MOUTH DAILY  . OXYGEN Inhale 1.5-2 L into the lungs continuous.   . polyethylene glycol (MIRALAX / GLYCOLAX) packet Take 17 g by mouth daily. (Patient taking differently: Take 17 g by mouth daily as needed for mild constipation. )  . PREVIDENT 5000 SENSITIVE 1.1-5 % PSTE Use daily as directed  . PROAIR HFA 108 (90 Base) MCG/ACT inhaler INHALE 2 PUFFS INTO THE LUNGS EVERY 4  HOURS AS NEEDED FOR WHEEZING OR SHORTNESS OF BREATH  . spironolactone (ALDACTONE) 25 MG tablet Take 1 tablet (25 mg total) by mouth daily.  . SYRINGE/NEEDLE, DISP, 1 ML (B-D SYRINGE/NEEDLE 1CC/25GX5/8) 25G X 5/8" 1 ML MISC 1 Units by Does not apply route every 30 (thirty) days.  Alveda Reasons 20 MG TABS tablet TAKE 1 TABLET(20 MG) BY MOUTH DAILY WITH SUPPER   No facility-administered encounter medications on file as of 02/07/2020.      Objective:  Wt Readings from Last 3 Encounters:  02/06/20 233 lb 6.4 oz (105.9 kg)  02/05/20 231 lb (104.8 kg)  02/01/20 232 lb (105.2 kg)   BP Readings from Last 3 Encounters:  02/06/20 (!) 155/92  02/05/20 130/90  01/11/20 (!) 145/88   Lab Results  Component Value Date   CHOL 249 (H) 11/10/2019   HDL 70 11/10/2019   LDLCALC 164 (H) 11/10/2019   TRIG 53 11/10/2019   CHOLHDL 3.6 11/10/2019    Goals Addressed              This Visit's Progress     Patient Stated   .  "I have just been diagnosed with lung cancer, I live alone in a 2 story townhouse, I have no local family and before I start treatment I am going to need help in my home and a hospital bed." (pt-stated)        [place] (see longitudinal plan of care for additional care plan information)  Current Barriers:  Chronic Disease Management support, education, and care coordination needs related to CAD, HTN, HLD, COPD, CKD Stage 3, Anxiety, and Asthma-patient states she lives in a 2 story town home and due to her pulmonary issues (COPD, Asthma) and recent diagnosis of lung cancer, she can no longer negotiate the stairs, she also reports 3-4 pillow orthopnea   Clinical Goal(s) related to CAD, HTN, HLD, COPD, CKD Stage 3, Anxiety, and Asthma:  Over the next 30 days, patient will:  . Work with the care management team to address educational, disease management, and care coordination needs  . Begin or continue self health monitoring activities as directed today  work with care management team to secure DME and home care assistance . Call provider office for new or worsened signs and symptoms CAD, HTN, HLD, COPD, CKD Stage 3, Anxiety, and Asthma . Call care management team with questions or concerns . Verbalize basic understanding of patient centered plan of care established today  Interventions related to CAD, HTN, HLD, COPD, CKD Stage 3, Anxiety, and Asthma:  . Evaluation of current treatment plans and  patient's adherence to plan as established by provider . Assessed patient understanding of disease states . Assessed home care and DME needs . Assessed patient's education and care coordination needs . Provided disease specific education to patient  . Collaborated with appropriate clinical care team members regarding patient needs -notified provider of need for orders related to DME of semi- electric hospital bed, 3 in 1, and personal emergency response system, notified CCM BSW of patient's request for PCS  Patient Self Care Activities related to CAD, HTN, HLD, COPD, CKD Stage 3, Anxiety, and Asthma:  . Patient is unable to independently self-manage chronic health conditions  Initial goal documentation         Ms. Schoolfield was given information about Chronic Care Management services today including:  1. CCM service includes personalized support from designated clinical staff supervised by her physician, including individualized plan of care and  coordination with other care providers 2. 24/7 contact phone numbers for assistance for urgent and routine care needs. 3. Service will only be billed when office clinical staff spend 20 minutes or more in a month to coordinate care. 4. Only one practitioner may furnish and bill the service in a calendar month. 5. The patient may stop CCM services at any time (effective at the end of the month) by phone call to the office staff. 6. The patient will be responsible for cost sharing (co-pay) of up to 20% of the service fee (after annual deductible is met).  Patient agreed to services and verbal consent obtained.   Plan:   The care management team will reach out to the patient again over the next 30 days.   Kelli Churn RN, CCM, [facility] RN Care Manager [phone]

## 2020-02-07 NOTE — Patient Instructions (Signed)
Given over the phone.

## 2020-02-07 NOTE — Assessment & Plan Note (Signed)
She is following with Dr. Elsworth Soho and Dr. Julien Nordmann in order to get a biopsy.

## 2020-02-07 NOTE — Telephone Encounter (Signed)
ATC pt, no answer. Left message for pt to call back.  

## 2020-02-07 NOTE — Progress Notes (Signed)
[facility] Telephone Encounter Continuity Care Appointment  HPI:   This telephone encounter was created for [name] on 02/07/2020 for the following purpose/cc follow up of thyroid ultrasound and lung mass.  She reports being exhausted today.  Mood is down today and she reports not feeling well.  She saw Dr. Elsworth Soho and Dr. Julien Nordmann.  She notes that she has been told she has a nodule under her arm and a nodule in her lung.  She feels like her SOB is getting worse.  She is thankful that this was caught and that she is getting work up for this. She is due to get a bronchoscopy for a biopsy to discover the cause of this mass.    Her aunt just died of stage IV lung cancer.    She had an ultrasound of the thyroid yesterday.  Dr. Julien Nordmann is not super concerned about this nodule, focusing more on the growth in the lungs at this time.  Dr. Julien Nordmann agrees to get the bronchoscopy.  She does feel like her breathing needs to be better to get her bronchoscopy.  Dr. Elsworth Soho will be working with her to improve this based on her understanding.    She was told by Dr. Julien Nordmann that she does not need a thyroid biopsy at this time (I agree) and we will reschedule this at a later date.   Past Medical History:  Past Medical History:  Diagnosis Date  . Anemia   . Anxiety    HX PANIC ATTACKS  . Arthritis    "starting to; in my hands" (07/09/2015)  . Asthma   . Atrial fibrillation ([place])   . Atrial flutter, paroxysmal ([place])   . Bloated abdomen   . CFS (chronic fatigue syndrome)   . Chewing difficulty   . Chronic asthma with acute exacerbation    "I have chronic asthma all the time; sometimes exacerbations" (07/09/2015)  . Chronic lower back pain   . COPD (chronic obstructive pulmonary disease) ([place])   . Cyst of right kidney    "3 of them; dx'd in ~ 01/2015"  . Dyspnea   . GERD (gastroesophageal reflux disease)   . Heart murmur   . History of blood transfusion    "related to  my brain surgery I think"  . History of pulmonary embolism 07/09/2015  . HIV antibody positive ([place])   . HIV disease ([place])   . Hyperlipidemia   . Hypertension   . Leg edema   . Lipodystrophy   . Mild CAD 2013  . Osteopenia   . Palpitations   . Pneumonia 07/09/2015  . Shingles   . Sleep apnea    "never completed part 2 of study; never wore mask" (07/09/2015)  . Vitamin B 12 deficiency   . Vitamin D deficiency       ROS:   Fatigue, drained, exhausted and feeling ill.     Assessment / Plan / Recommendations:   Please see A&P under problem oriented charting for assessment of the patient's acute and chronic medical conditions.   As always, pt is advised that if symptoms worsen or new symptoms arise, they should go to an urgent care facility or to to ER for further evaluation.   Consent and Medical Decision Making:   This is a telephone encounter between Shannon [name] and Gilles Chiquito on 02/07/2020 for follow up of lung mass. The visit was conducted with the patient located at home and Gilles Chiquito at [facility].  The patient's identity was confirmed using their DOB and current address. The patient has consented to being evaluated through a telephone encounter and understands the associated risks (an examination cannot be done and the patient may need to come in for an appointment) / benefits (allows the patient to remain at home, decreasing exposure to coronavirus). I personally spent 20 minutes on medical discussion.     Return to clinic after work up of lung mass complete.  1-2 months.

## 2020-02-07 NOTE — Assessment & Plan Note (Signed)
She continues to follow with [name].  Her HIV is well controlled.  She will continue biktarvy.

## 2020-02-07 NOTE — Telephone Encounter (Signed)
Please let her know that I have reviewed her US thyroid results & Dr Earlie Server consultation. I see that FNA of thyroid nodule has been scheduled.  I have asked my partner to schedule a bronchoscopy with [facility] as we discussed under anesthesia. They should be calling her to schedule. She will need cardiac clearance for this low risk procedure  & I will reach out to dr Oval Linsey for this.

## 2020-02-07 NOTE — Chronic Care Management (AMB) (Signed)
  Chronic Care Management   Outreach Note  02/07/2020 Name: Shannon [name] MRN: [id number] DOB: [birth year]  Referred by: [name], MD Reason for referral : COPD, HLD   An unsuccessful telephone outreach was attempted today. The patient was referred to the case management team for assistance with care management and care coordination.  An initial intake was scheduled for today after her 10:45 am clinic appointment.  Follow Up Plan: A HIPPA compliant phone message was left for the patient providing contact information and requesting a return call. If no return call from patient, the care management team will reach out to the patient again over the next 7 days.   Kelli Churn RN, CCM, [facility] RN Care Manager [phone]

## 2020-02-07 NOTE — Assessment & Plan Note (Signed)
Will plan to get thyroid biopsy when she has completed her work up for lung mass.  She will need a biopsy of nodule #5 based on her thyroid ultrasound.

## 2020-02-07 NOTE — Patient Instructions (Signed)
Visit Information It was nice speaking with you today. We will start the process of filing for PCS and ordering DME for you.  Goals Addressed              This Visit's Progress     Patient Stated   .  "I have just been diagnosed with lung cancer, I live alone in a 2 story townhouse, I have no local family and before I start treatment I am going to need help in my home and a hospital bed." (pt-stated)        [place] (see longitudinal plan of care for additional care plan information)  Current Barriers:  . Chronic Disease Management support, education, and care coordination needs related to CAD, HTN, HLD, COPD, CKD Stage 3, Anxiety, and Asthma  Clinical Goal(s) related to CAD, HTN, HLD, COPD, CKD Stage 3, Anxiety, and Asthma:  Over the next 30 days, patient will:  . Work with the care management team to address educational, disease management, and care coordination needs  . Begin or continue self health monitoring activities as directed today  work with care management team to secure DME and home care assistance . Call provider office for new or worsened signs and symptoms CAD, HTN, HLD, COPD, CKD Stage 3, Anxiety, and Asthma . Call care management team with questions or concerns . Verbalize basic understanding of patient centered plan of care established today  Interventions related to CAD, HTN, HLD, COPD, CKD Stage 3, Anxiety, and Asthma:  . Evaluation of current treatment plans and patient's adherence to plan as established by provider . Assessed patient understanding of disease states . Assessed home care and DME needs . Assessed patient's education and care coordination needs . Provided disease specific education to patient  . Collaborated with appropriate clinical care team members regarding patient needs -notified provider of need for orders related to DME of semi- electric hospital bed, 3 in 1, and personal emergency response system, notified CCM BSW of patient's  request for PCS  Patient Self Care Activities related to CAD, HTN, HLD, COPD, CKD Stage 3, Anxiety, and Asthma:  . Patient is unable to independently self-manage chronic health conditions  Initial goal documentation        Ms. Pichon was given information about Chronic Care Management services today including:  1. CCM service includes personalized support from designated clinical staff supervised by her physician, including individualized plan of care and coordination with other care providers 2. 24/7 contact phone numbers for assistance for urgent and routine care needs. 3. Service will only be billed when office clinical staff spend 20 minutes or more in a month to coordinate care. 4. Only one practitioner may furnish and bill the service in a calendar month. 5. The patient may stop CCM services at any time (effective at the end of the month) by phone call to the office staff. 6. The patient will be responsible for cost sharing (co-pay) of up to 20% of the service fee (after annual deductible is met).  Patient agreed to services and verbal consent obtained.   The patient verbalized understanding of instructions provided today and declined a print copy of patient instruction materials.   The care management team will reach out to the patient again over the next 30 days.   Kelli Churn RN, CCM, [facility] RN Care Manager [phone]

## 2020-02-08 ENCOUNTER — Ambulatory Visit: Payer: Self-pay

## 2020-02-08 ENCOUNTER — Telehealth: Payer: Self-pay | Admitting: Cardiovascular Disease

## 2020-02-08 ENCOUNTER — Telehealth: Payer: Self-pay | Admitting: Emergency Medicine

## 2020-02-08 NOTE — Telephone Encounter (Signed)
Forwarding to other doctors, I think she does need thyroid biopsy - perhaps we can co-ordinate & get both done back to back since she will have to be covid tested & anticoagulation stopped for both procedures. Will need cardiac clearance for both low risk procedures

## 2020-02-08 NOTE — Telephone Encounter (Signed)
Patient is calling to make Dr. Oval Linsey aware that she is having a thyroid biopsy on 02/28/20. She states the office requesting clearance will contact our office as well.

## 2020-02-08 NOTE — Chronic Care Management (AMB) (Signed)
  Chronic Care Management   Outreach Note  02/08/2020 Name: Shannon [name] MRN: [id number] DOB: 03-Jan-1956  Referred by: [name], MD Reason for referral : Care Coordination (PCS, SDOH, Advance Directives)   An unsuccessful telephone outreach was attempted today. The patient was referred to the case management team for assistance with care management and care coordination.   Follow Up Plan: A HIPPA compliant phone message was left for the patient providing contact information and requesting a return call.     Ronn Melena, [place] Coordination Social Worker [place] [phone]

## 2020-02-08 NOTE — Telephone Encounter (Signed)
I'll check with Endo and see if they will adjust - make room to get her in for 6/22. Will let you know.

## 2020-02-08 NOTE — Telephone Encounter (Signed)
Patient has been scheduled for the following:  ENB:  02/20/20 @ 12:45, check in by 10:15 at [facility] (February 13, 2020 was already full) COVID Test:  6/26 @ 11:30  Pt has been given the appointment info for the covid test & the ENB.  Also I have let pt know to stop Xarelto 3 days prior.    Also, I have requested that the CT from 5/19 be converted to a SuperD.

## 2020-02-08 NOTE — Telephone Encounter (Signed)
I called and spoke with the pt and relayed the response from Dr Elsworth Soho  She states from her understanding from Dr Daryll Drown, she does not need to have the thyroid biopsy at this time. She wants all of her doctors to communicate with each other and get this figured out because she needs to know what to do.

## 2020-02-09 ENCOUNTER — Ambulatory Visit: Payer: Medicare Other

## 2020-02-09 ENCOUNTER — Ambulatory Visit: Payer: Self-pay | Admitting: *Deleted

## 2020-02-09 DIAGNOSIS — J4541 Moderate persistent asthma with (acute) exacerbation: Secondary | ICD-10-CM

## 2020-02-09 DIAGNOSIS — I10 Essential (primary) hypertension: Secondary | ICD-10-CM

## 2020-02-09 NOTE — Telephone Encounter (Signed)
I notified pt of new date & time of 6/22 @ 1:15, checking in by 10:45; covid test moved to 6/19 @ 11:45 AM & told to stop Xarelto as well.  Pt verbalized understanding of new dates & stated nothing further needed at this time.

## 2020-02-09 NOTE — Chronic Care Management (AMB) (Signed)
Chronic Care Management   Follow Up Note   02/09/2020 Name: Shannon [name] MRN: [id number] DOB: 1955-09-07  Referred by: [name], MD Reason for referral : Chronic Care Management (COPD, Asthma, CKD, Lung Ca)   Shannon [name] is a 64 y.o. year old female who is a primary care patient of [name], MD. The CCM team was consulted for assistance with chronic disease management and care coordination needs.    Review of patient status, including review of consultants reports, relevant laboratory and other test results, and collaboration with appropriate care team members and the patient's provider was performed as part of comprehensive patient evaluation and provision of chronic care management services.    SDOH (Social Determinants of Health) assessments performed: No See Care Plan activities for detailed interventions related to [facility])     Outpatient Encounter Medications as of 02/09/2020  Medication Sig  . acetaminophen (TYLENOL) 325 MG tablet Take 325 mg by mouth every 6 (six) hours as needed (FOR PAIN).  [name] albuterol (PROVENTIL) (2.5 MG/3ML) 0.083% nebulizer solution USE 1 VIAL VIA NEBULIZER EVERY 6 HOURS AS NEEDED FOR WHEEZING OR SHORTNESS OF BREATH (Patient taking differently: Take 2.5 mg by nebulization every 6 (six) hours as needed for wheezing or shortness of breath. USE 1 VIAL VIA NEBULIZER EVERY 6 HOURS AS NEEDED FOR WHEEZING OR SHORTNESS OF BREATH)  . Ascorbic Acid (VITAMIN C) 1000 MG tablet Take 1,000 mg by mouth every other day.   . b complex vitamins tablet Take 1 tablet by mouth daily.  . bictegravir-emtricitabine-tenofovir AF (BIKTARVY) 50-200-25 MG TABS tablet Take 1 tablet by mouth daily.  [name] BIOTIN PO Take 1 tablet by mouth daily as needed (for supplementation).   . calcium carbonate (OSCAL) 1500 (600 Ca) MG TABS tablet Take 600 mg of elemental calcium by mouth daily with breakfast.  . Cholecalciferol (SM VITAMIN D3) 100 MCG (4000 UT) CAPS Take 1 capsule (4,000  Units total) by mouth daily.  . cloNIDine (CATAPRES - DOSED IN MG/24 HR) 0.3 mg/24hr patch Place 0.3 mg onto the skin once a week.   . cyanocobalamin (,VITAMIN B-12,) 1000 MCG/ML injection ADMINISTER 1 ML(1000 MCG) IN THE MUSCLE EVERY 30 DAYS (Patient taking differently: Inject 1,000 mcg into the muscle every 30 (thirty) days. ADMINISTER 1 ML(1000 MCG) IN THE MUSCLE EVERY 30 DAYS)  . diltiazem (CARDIZEM CD) 240 MG 24 hr capsule TAKE 1 CAPSULE(240 MG) BY MOUTH DAILY (Patient taking differently: Take 240 mg by mouth daily. )  . fluticasone (FLONASE) 50 MCG/ACT nasal spray Place 2 sprays into both nostrils daily. (Patient taking differently: Place 2 sprays into both nostrils daily as needed for allergies. )  . fluticasone furoate-vilanterol (BREO ELLIPTA) 200-25 MCG/INH AEPB Inhale 1 puff into the lungs daily.  . furosemide (LASIX) 40 MG tablet Take 1 tablet (40 mg total) by mouth daily. (Patient taking differently: Take 20 mg by mouth every other day. )  . MAGNESIUM PO Take 1 tablet by mouth every other day.   . Melatonin 5 MG TABS Take 5 mg by mouth at bedtime as needed (for sleep).  . Misc. Devices (PULSE OXIMETER FOR FINGER) MISC 1 Units by Does not apply route as needed.  . Mouthwashes (BIOTENE DRY MOUTH GENTLE) LIQD Use as directed 1 Dose in the mouth or throat 2 (two) times daily. (Patient taking differently: Use as directed 1 Dose in the mouth or throat 2 (two) times daily as needed (dry mouth). )  . Omega-3 Fatty Acids (FISH  OIL) 1000 MG CAPS Take 1,000 mg by mouth daily as needed (pt prefrence).  [name] omeprazole (PRILOSEC) 40 MG capsule TAKE 1 CAPSULE(40 MG) BY MOUTH DAILY (Patient taking differently: Take 40 mg by mouth daily as needed (heart burn). )  . OXYGEN Inhale 1.5-2 L into the lungs See admin instructions. Uses at bedtime and through the day as needed  . polyethylene glycol (MIRALAX / GLYCOLAX) packet Take 17 g by mouth daily. (Patient taking differently: Take 17 g by mouth daily as needed  for mild constipation. )  . PREVIDENT 5000 SENSITIVE 1.1-5 % PSTE Use daily as directed  . PROAIR HFA 108 (90 Base) MCG/ACT inhaler INHALE 2 PUFFS INTO THE LUNGS EVERY 4 HOURS AS NEEDED FOR WHEEZING OR SHORTNESS OF BREATH (Patient taking differently: Inhale 2 puffs into the lungs every 4 (four) hours as needed for wheezing or shortness of breath. INHALE 2 PUFFS INTO THE LUNGS EVERY 4 HOURS AS NEEDED FOR WHEEZING OR SHORTNESS OF BREATH)  . senna (SENOKOT) 8.6 MG tablet Take 1 tablet by mouth daily as needed for constipation.  [name] spironolactone (ALDACTONE) 25 MG tablet Take 1 tablet (25 mg total) by mouth daily.  . SYRINGE/NEEDLE, DISP, 1 ML (B-D SYRINGE/NEEDLE 1CC/25GX5/8) 25G X 5/8" 1 ML MISC 1 Units by Does not apply route every 30 (thirty) days.  . vitamin A 3 MG (10000 UNITS) capsule Take 10,000 Units by mouth daily.  Alveda Reasons 20 MG TABS tablet TAKE 1 TABLET(20 MG) BY MOUTH DAILY WITH SUPPER (Patient taking differently: Take 20 mg by mouth daily with supper. )  . zinc gluconate 50 MG tablet Take 50 mg by mouth daily.   No facility-administered encounter medications on file as of 02/09/2020.     Objective:   Goals Addressed              This Visit's Progress     Patient Stated   .  "I have just been diagnosed with lung cancer, I live alone in a 2 story townhouse, I have no local family and before I start treatment I am going to need help in my home and a hospital bed." (pt-stated)        [place] (see longitudinal plan of care for additional care plan information)  Current Barriers:  . Chronic Disease Management support, education, and care coordination needs related to CAD, HTN, HLD, COPD, CKD Stage 3, Anxiety, and Asthma- patient states she lives in a 2 story town home and due to her pulmonary issues (COPD, Asthma) and recent diagnosis of lung cancer, she can no longer negotiate the stairs, she also reports 3-4 pillow orthopnea  Clinical Goal(s) related to CAD, HTN, HLD,  COPD, CKD Stage 3, Anxiety, and Asthma:  Over the next 30 days, patient will:  . Work with the care management team to address educational, disease management, and care coordination needs  . Begin or continue self health monitoring activities as directed today  work with care management team to secure DME and home care assistance . Call provider office for new or worsened signs and symptoms CAD, HTN, HLD, COPD, CKD Stage 3, Anxiety, and Asthma . Call care management team with questions or concerns . Verbalize basic understanding of patient centered plan of care established today  Interventions related to CAD, HTN, HLD, COPD, CKD Stage 3, Anxiety, and Asthma:  . Evaluation of current treatment plans and patient's adherence to plan as established by provider . Assessed patient understanding of disease states . Assessed  home care and DME needs . Assessed patient's education and care coordination needs . Provided disease specific education to patient  . Collaborated with appropriate clinical care team members regarding patient needs -notified provider of need for orders related to DME of semi- electric hospital bed, 3 in 1, and personal emergency response system, notified CCM BSW of patient's request for PCS.  . 02/09/20 Notified Skeet Latch DME rep with [employer] via community message of DME order for hospital bed and 3 in 1.  Patient Self Care Activities related to CAD, HTN, HLD, COPD, CKD Stage 3, Anxiety, and Asthma:  . Patient is unable to independently self-manage chronic health conditions  Initial goal documentation         Plan:   The care management team will reach out to the patient again over the next 30 days.   Kelli Churn RN, CCM, [facility] RN Care Manager [phone]

## 2020-02-09 NOTE — Patient Instructions (Signed)
Licensed Clinical Social Worker Visit Information  Goals we discussed today:  Goals Addressed              This Visit's Progress   .  " I need transportation to a drive through [place] test on 02/10/20" (pt-stated)        Shannon [name] (see longitudinal plan of care for additional care plan information)  Current Barriers:  . Transportation.    Clinical Social Work Clinical Goal(s):  [name] Over the next 2 days, patient will work with SW to address concerns related to transportation   Interventions: . Inter-disciplinary care team collaboration (see longitudinal plan of care) . Urgent request for transportation sent to [employer] . Called patient back to inform her that she should be contacted Transportation Services to confirm ride . Requested that Transportation Services follow up with me as well to confirm that request can be accommodated.   . Received confirmation from Transportation Services at 4:44 PM that ride was scheduled for patient. . Called patient to inform her that ride was scheduled and that she will be contacted by [employer] regarding vendor that is being used and pick up time.    Patient Self Care Activities:  . Self administers medications as prescribed . Attends all scheduled provider appointments . Calls provider office for new concerns or questions . Unable to perform IADLs independently  Initial goal documentation     .  "I have just been diagnosed with lung cancer, I live alone in a 2 story townhouse, I have no local family and before I start treatment I am going to need help in my home and a hospital bed." (pt-stated)        Shannon [name] (see longitudinal plan of care for additional care plan information)  Current Barriers:  . Chronic Disease Management support, education, and care coordination needs related to CAD, HTN, HLD, COPD, CKD Stage 3, Anxiety, and Asthma- patient states she lives in a 2 story town home and due to  her pulmonary issues (COPD, Asthma) and recent diagnosis of lung cancer, she can no longer negotiate the stairs, she also reports 3-4 pillow orthopnea  Clinical Goal(s) related to CAD, HTN, HLD, COPD, CKD Stage 3, Anxiety, and Asthma:  Over the next 30 days, patient will:  . Work with the care management team to address educational, disease management, and care coordination needs  . Begin or continue self health monitoring activities as directed today  work with care management team to secure DME and home care assistance . Call provider office for new or worsened signs and symptoms CAD, HTN, HLD, COPD, CKD Stage 3, Anxiety, and Asthma . Call care management team with questions or concerns . Verbalize basic understanding of patient centered plan of care established today  Interventions related to CAD, HTN, HLD, COPD, CKD Stage 3, Anxiety, and Asthma:  . Discussed eligibility and assessment process for Personal Care Services.   . Completed DMA-3051/Request for PCS form and forwarded to provider for review/signature.    Patient Self Care Activities related to CAD, HTN, HLD, COPD, CKD Stage 3, Anxiety, and Asthma:  . Patient is unable to independently self-manage chronic health conditions  Please see past updates related to this goal by clicking on the "Past Updates" button in the selected goal          Materials provided: Verbal education about eligibility and assessment process for [employer]  provided by phone  Shannon [name] was given information  about Chronic Care Management services today including:  1. CCM service includes personalized support from designated clinical staff supervised by her physician, including individualized plan of care and coordination with other care providers 2. 24/7 contact phone numbers for assistance for urgent and routine care needs. 3. Service will only be billed when office clinical staff spend 20 minutes or more in a month to coordinate care. 4. Only  one practitioner may furnish and bill the service in a calendar month. 5. The patient may stop CCM services at any time (effective at the end of the month) by phone call to the office staff. 6. The patient will be responsible for cost sharing (co-pay) of up to 20% of the service fee (after annual deductible is met).  Patient agreed to services and verbal consent obtained.   Patient verbalizes understanding of instructions provided today.   Follow up plan: Will follow up with patient once request for PCS has been sent to [facility] and is being processed.        Shannon [name], [place] Coordination Social Worker [place] [phone]

## 2020-02-09 NOTE — Addendum Note (Signed)
Addended byDewayne Hatch on: 02/09/2020 08:46 AM   Modules accepted: Orders

## 2020-02-09 NOTE — Progress Notes (Signed)
Internal Medicine Clinic Resident  I have personally reviewed this encounter including the documentation in this note and/or discussed this patient with the care management provider. I will address any urgent items identified by the care management provider and will communicate my actions to the patient's PCP. I have reviewed the patient's CCM visit with my supervising attending, Dr Evette Doffing.  Placed order for DME for semi-electric hospital bed, 3 in 1 and personal emergency response system. Patient requires HOB to be elevated >30 degrees most of the time due to COPD and asthma and that she requires positioning of her body that are not feasible with an ordinary bed. ( to justify need for hospital bed.)    Dewayne Hatch, MD 02/09/2020

## 2020-02-09 NOTE — Telephone Encounter (Signed)
Thanks

## 2020-02-09 NOTE — Chronic Care Management (AMB) (Signed)
Chronic Care Management    Clinical Social Work General Note  02/09/2020 Name: Shannon [name] MRN: [id number] DOB: 30-May-1956  Shannon [name] is a 64 y.o. year old female who is a primary care patient of [name], MD. The CCM was consulted to assist the patient with Transportation Needs , PCS..   Ms. Mahr was given information about Chronic Care Management services today including:  1. CCM service includes personalized support from designated clinical staff supervised by her physician, including individualized plan of care and coordination with other care providers 2. 24/7 contact phone numbers for assistance for urgent and routine care needs. 3. Service will only be billed when office clinical staff spend 20 minutes or more in a month to coordinate care. 4. Only one practitioner may furnish and bill the service in a calendar month. 5. The patient may stop CCM services at any time (effective at the end of the month) by phone call to the office staff. 6. The patient will be responsible for cost sharing (co-pay) of up to 20% of the service fee (after annual deductible is met).  Patient agreed to services and verbal consent obtained.   Review of patient status, including review of consultants reports, relevant laboratory and other test results, and collaboration with appropriate care team members and the patient's provider was performed as part of comprehensive patient evaluation and provision of chronic care management services.    SDOH (Social Determinants of Health) assessments and interventions performed:  No    Outpatient Encounter Medications as of 02/09/2020  Medication Sig  . acetaminophen (TYLENOL) 325 MG tablet Take 325 mg by mouth every 6 (six) hours as needed (FOR PAIN).  [name] albuterol (PROVENTIL) (2.5 MG/3ML) 0.083% nebulizer solution USE 1 VIAL VIA NEBULIZER EVERY 6 HOURS AS NEEDED FOR WHEEZING OR SHORTNESS OF BREATH (Patient taking differently: Take 2.5 mg by nebulization  every 6 (six) hours as needed for wheezing or shortness of breath. USE 1 VIAL VIA NEBULIZER EVERY 6 HOURS AS NEEDED FOR WHEEZING OR SHORTNESS OF BREATH)  . Ascorbic Acid (VITAMIN C) 1000 MG tablet Take 1,000 mg by mouth every other day.   . b complex vitamins tablet Take 1 tablet by mouth daily.  . bictegravir-emtricitabine-tenofovir AF (BIKTARVY) 50-200-25 MG TABS tablet Take 1 tablet by mouth daily.  [name] BIOTIN PO Take 1 tablet by mouth daily as needed (for supplementation).   . calcium carbonate (OSCAL) 1500 (600 Ca) MG TABS tablet Take 600 mg of elemental calcium by mouth daily with breakfast.  . Cholecalciferol (SM VITAMIN D3) 100 MCG (4000 UT) CAPS Take 1 capsule (4,000 Units total) by mouth daily.  . cloNIDine (CATAPRES - DOSED IN MG/24 HR) 0.3 mg/24hr patch Place 0.3 mg onto the skin once a week.   . cyanocobalamin (,VITAMIN B-12,) 1000 MCG/ML injection ADMINISTER 1 ML(1000 MCG) IN THE MUSCLE EVERY 30 DAYS (Patient taking differently: Inject 1,000 mcg into the muscle every 30 (thirty) days. ADMINISTER 1 ML(1000 MCG) IN THE MUSCLE EVERY 30 DAYS)  . diltiazem (CARDIZEM CD) 240 MG 24 hr capsule TAKE 1 CAPSULE(240 MG) BY MOUTH DAILY (Patient taking differently: Take 240 mg by mouth daily. )  . fluticasone (FLONASE) 50 MCG/ACT nasal spray Place 2 sprays into both nostrils daily. (Patient taking differently: Place 2 sprays into both nostrils daily as needed for allergies. )  . fluticasone furoate-vilanterol (BREO ELLIPTA) 200-25 MCG/INH AEPB Inhale 1 puff into the lungs daily.  . furosemide (LASIX) 40 MG tablet Take 1 tablet (40  mg total) by mouth daily. (Patient taking differently: Take 20 mg by mouth every other day. )  . MAGNESIUM PO Take 1 tablet by mouth every other day.   . Melatonin 5 MG TABS Take 5 mg by mouth at bedtime as needed (for sleep).  . Misc. Devices (PULSE OXIMETER FOR FINGER) MISC 1 Units by Does not apply route as needed.  . Mouthwashes (BIOTENE DRY MOUTH GENTLE) LIQD Use as  directed 1 Dose in the mouth or throat 2 (two) times daily. (Patient taking differently: Use as directed 1 Dose in the mouth or throat 2 (two) times daily as needed (dry mouth). )  . Omega-3 Fatty Acids (FISH OIL) 1000 MG CAPS Take 1,000 mg by mouth daily as needed (pt prefrence).  [name] omeprazole (PRILOSEC) 40 MG capsule TAKE 1 CAPSULE(40 MG) BY MOUTH DAILY (Patient taking differently: Take 40 mg by mouth daily as needed (heart burn). )  . OXYGEN Inhale 1.5-2 L into the lungs See admin instructions. Uses at bedtime and through the day as needed  . polyethylene glycol (MIRALAX / GLYCOLAX) packet Take 17 g by mouth daily. (Patient taking differently: Take 17 g by mouth daily as needed for mild constipation. )  . PREVIDENT 5000 SENSITIVE 1.1-5 % PSTE Use daily as directed  . PROAIR HFA 108 (90 Base) MCG/ACT inhaler INHALE 2 PUFFS INTO THE LUNGS EVERY 4 HOURS AS NEEDED FOR WHEEZING OR SHORTNESS OF BREATH (Patient taking differently: Inhale 2 puffs into the lungs every 4 (four) hours as needed for wheezing or shortness of breath. INHALE 2 PUFFS INTO THE LUNGS EVERY 4 HOURS AS NEEDED FOR WHEEZING OR SHORTNESS OF BREATH)  . senna (SENOKOT) 8.6 MG tablet Take 1 tablet by mouth daily as needed for constipation.  [name] spironolactone (ALDACTONE) 25 MG tablet Take 1 tablet (25 mg total) by mouth daily.  . SYRINGE/NEEDLE, DISP, 1 ML (B-D SYRINGE/NEEDLE 1CC/25GX5/8) 25G X 5/8" 1 ML MISC 1 Units by Does not apply route every 30 (thirty) days.  . vitamin A 3 MG (10000 UNITS) capsule Take 10,000 Units by mouth daily.  Alveda Reasons 20 MG TABS tablet TAKE 1 TABLET(20 MG) BY MOUTH DAILY WITH SUPPER (Patient taking differently: Take 20 mg by mouth daily with supper. )  . zinc gluconate 50 MG tablet Take 50 mg by mouth daily.   No facility-administered encounter medications on file as of 02/09/2020.    Goals Addressed              This Visit's Progress   .  " I need transportation to a drive through [place] test on 02/10/20"  (pt-stated)        [place] (see longitudinal plan of care for additional care plan information)  Current Barriers:  . Transportation.    Clinical Social Work Clinical Goal(s):  [name] Over the next 2 days, patient will work with SW to address concerns related to transportation   Interventions: . Inter-disciplinary care team collaboration (see longitudinal plan of care) . Urgent request for transportation sent to [employer] . Called patient back to inform her that she should be contacted Transportation Services to confirm ride . Requested that Transportation Services follow up with me as well to confirm that request can be accommodated.   . Received confirmation from Transportation Services at 4:44 PM that ride was scheduled for patient. . Called patient to inform her that ride was scheduled and that she will be contacted by [employer] regarding vendor that is being  used and pick up time.    Patient Self Care Activities:  . Self administers medications as prescribed . Attends all scheduled provider appointments . Calls provider office for new concerns or questions . Unable to perform IADLs independently  Initial goal documentation     .  "I have just been diagnosed with lung cancer, I live alone in a 2 story townhouse, I have no local family and before I start treatment I am going to need help in my home and a hospital bed." (pt-stated)        [place] (see longitudinal plan of care for additional care plan information)  Current Barriers:  . Chronic Disease Management support, education, and care coordination needs related to CAD, HTN, HLD, COPD, CKD Stage 3, Anxiety, and Asthma- patient states she lives in a 2 story town home and due to her pulmonary issues (COPD, Asthma) and recent diagnosis of lung cancer, she can no longer negotiate the stairs, she also reports 3-4 pillow orthopnea  Clinical Goal(s) related to CAD, HTN, HLD, COPD, CKD  Stage 3, Anxiety, and Asthma:  Over the next 30 days, patient will:  . Work with the care management team to address educational, disease management, and care coordination needs  . Begin or continue self health monitoring activities as directed today  work with care management team to secure DME and home care assistance . Call provider office for new or worsened signs and symptoms CAD, HTN, HLD, COPD, CKD Stage 3, Anxiety, and Asthma . Call care management team with questions or concerns . Verbalize basic understanding of patient centered plan of care established today  Interventions related to CAD, HTN, HLD, COPD, CKD Stage 3, Anxiety, and Asthma:  . Discussed eligibility and assessment process for Personal Care Services.   . Completed DMA-3051/Request for PCS form and forwarded to provider for review/signature.    Patient Self Care Activities related to CAD, HTN, HLD, COPD, CKD Stage 3, Anxiety, and Asthma:  . Patient is unable to independently self-manage chronic health conditions  Please see past updates related to this goal by clicking on the "Past Updates" button in the selected goal          Follow Up Plan: Will follow up with patient once request for PCS has been sent to [facility] and is being processed.         Ronn Melena, [place] Coordination Social Worker [place] [phone]

## 2020-02-09 NOTE — Telephone Encounter (Signed)
Thank you for the updates.  She is at acceptable risk for both procedures and may hold her anticoagulation for up to 5 days prior to the procedures as you see fit.

## 2020-02-09 NOTE — Telephone Encounter (Signed)
Please see Dr. Blenda Mounts note from this morning.

## 2020-02-09 NOTE — Progress Notes (Signed)
Internal Medicine Clinic Attending  CCM services provided by the care management provider and their documentation were discussed with Dr. Myrtie Hawk. We reviewed the pertinent findings, urgent action items addressed by the resident and non-urgent items to be addressed by the PCP.  I agree with the assessment, diagnosis, and plan of care documented in the CCM and resident's note.  Axel Filler, MD 02/09/2020

## 2020-02-10 ENCOUNTER — Other Ambulatory Visit (HOSPITAL_COMMUNITY)
Admission: RE | Admit: 2020-02-10 | Discharge: 2020-02-10 | Disposition: A | Discharge status: Home / Self Care | Payer: Medicare Other | Source: Ambulatory Visit | Attending: Emergency Medicine | Admitting: Emergency Medicine

## 2020-02-10 DIAGNOSIS — Z01812 Encounter for preprocedural laboratory examination: Secondary | ICD-10-CM | POA: Insufficient documentation

## 2020-02-10 DIAGNOSIS — Z20822 Contact with and (suspected) exposure to covid-19: Secondary | ICD-10-CM | POA: Insufficient documentation

## 2020-02-10 LAB — SARS CORONAVIRUS 2 (TAT 6-24 HRS): SARS Coronavirus 2: NEGATIVE

## 2020-02-11 DIAGNOSIS — G4733 Obstructive sleep apnea (adult) (pediatric): Secondary | ICD-10-CM | POA: Diagnosis not present

## 2020-02-11 DIAGNOSIS — R269 Unspecified abnormalities of gait and mobility: Secondary | ICD-10-CM | POA: Diagnosis not present

## 2020-02-11 DIAGNOSIS — J45909 Unspecified asthma, uncomplicated: Secondary | ICD-10-CM | POA: Diagnosis not present

## 2020-02-11 DIAGNOSIS — J449 Chronic obstructive pulmonary disease, unspecified: Secondary | ICD-10-CM | POA: Diagnosis not present

## 2020-02-12 ENCOUNTER — Encounter: Payer: Self-pay | Admitting: Internal Medicine

## 2020-02-12 ENCOUNTER — Other Ambulatory Visit: Payer: Self-pay

## 2020-02-12 ENCOUNTER — Encounter (HOSPITAL_COMMUNITY): Payer: Self-pay | Admitting: Emergency Medicine

## 2020-02-12 NOTE — Telephone Encounter (Signed)
Dr Julien Nordmann, can you please opine on whether thyroid odule needs biopsy ?

## 2020-02-12 NOTE — Progress Notes (Signed)
Internal Medicine Clinic Resident  I have personally reviewed this encounter including the documentation in this note and/or discussed this patient with the care management provider. I will address any urgent items identified by the care management provider and will communicate my actions to the patient's PCP. I have reviewed the patient's CCM visit with my supervising attending, Dr Philipp Ovens.  Dewayne Hatch, MD 02/12/2020  '

## 2020-02-12 NOTE — Progress Notes (Signed)
Ms Shannon [name] Patient tested negative for Covid 02/10/20 and has been in quarantine since that time. Ms Shannon [name] is not going to take  po medications until after surgery. Ms Shannon [name] has shortness of breath and has had had it for  a couple years, patient uses oxygen 1.5 at hs, and has been using it during the day time as needed.  [name] has been expericing right sided chest pain and has told all the Drs. She has seen.

## 2020-02-12 NOTE — Telephone Encounter (Signed)
Hi Rakesh Based on the ultrasound finding, she may need a biopsy but not urgent.  Thank you.

## 2020-02-12 NOTE — Progress Notes (Signed)
[facility] Attending  CCM services provided by the care management provider and their documentation were discussed with Dr. Myrtie Hawk. We reviewed the pertinent findings, urgent action items addressed by the resident and non-urgent items to be addressed by the PCP.  I agree with the assessment, diagnosis, and plan of care documented in the CCM and resident's note.  Velna Ochs, MD 02/12/2020

## 2020-02-13 ENCOUNTER — Ambulatory Visit (HOSPITAL_COMMUNITY): Payer: Medicare Other | Admitting: Certified Registered Nurse Anesthetist

## 2020-02-13 ENCOUNTER — Encounter (HOSPITAL_COMMUNITY): Payer: Self-pay | Admitting: Emergency Medicine

## 2020-02-13 ENCOUNTER — Encounter (HOSPITAL_COMMUNITY)
Admission: RE | Disposition: A | Discharge status: Home Health | Payer: Self-pay | Source: Home / Self Care | Attending: Emergency Medicine

## 2020-02-13 ENCOUNTER — Inpatient Hospital Stay (HOSPITAL_COMMUNITY)
Admission: RE | Admit: 2020-02-13 | Discharge: 2020-02-17 | DRG: 166 | Disposition: A | Discharge status: Home Health | Payer: Medicare Other | Attending: Student | Admitting: Student

## 2020-02-13 ENCOUNTER — Ambulatory Visit (HOSPITAL_COMMUNITY): Payer: Medicare Other

## 2020-02-13 DIAGNOSIS — K219 Gastro-esophageal reflux disease without esophagitis: Secondary | ICD-10-CM | POA: Diagnosis not present

## 2020-02-13 DIAGNOSIS — J9601 Acute respiratory failure with hypoxia: Secondary | ICD-10-CM | POA: Diagnosis not present

## 2020-02-13 DIAGNOSIS — I483 Typical atrial flutter: Secondary | ICD-10-CM | POA: Diagnosis not present

## 2020-02-13 DIAGNOSIS — R069 Unspecified abnormalities of breathing: Secondary | ICD-10-CM

## 2020-02-13 DIAGNOSIS — Z9981 Dependence on supplemental oxygen: Secondary | ICD-10-CM | POA: Diagnosis not present

## 2020-02-13 DIAGNOSIS — J4551 Severe persistent asthma with (acute) exacerbation: Principal | ICD-10-CM | POA: Diagnosis present

## 2020-02-13 DIAGNOSIS — I5032 Chronic diastolic (congestive) heart failure: Secondary | ICD-10-CM

## 2020-02-13 DIAGNOSIS — I10 Essential (primary) hypertension: Secondary | ICD-10-CM | POA: Diagnosis not present

## 2020-02-13 DIAGNOSIS — Z7901 Long term (current) use of anticoagulants: Secondary | ICD-10-CM | POA: Diagnosis not present

## 2020-02-13 DIAGNOSIS — Z21 Asymptomatic human immunodeficiency virus [HIV] infection status: Secondary | ICD-10-CM | POA: Diagnosis present

## 2020-02-13 DIAGNOSIS — I1310 Hypertensive heart and chronic kidney disease without heart failure, with stage 1 through stage 4 chronic kidney disease, or unspecified chronic kidney disease: Secondary | ICD-10-CM | POA: Diagnosis present

## 2020-02-13 DIAGNOSIS — C801 Malignant (primary) neoplasm, unspecified: Secondary | ICD-10-CM | POA: Diagnosis not present

## 2020-02-13 DIAGNOSIS — B20 Human immunodeficiency virus [HIV] disease: Secondary | ICD-10-CM | POA: Diagnosis present

## 2020-02-13 DIAGNOSIS — N1831 Chronic kidney disease, stage 3a: Secondary | ICD-10-CM | POA: Diagnosis not present

## 2020-02-13 DIAGNOSIS — E041 Nontoxic single thyroid nodule: Secondary | ICD-10-CM | POA: Diagnosis not present

## 2020-02-13 DIAGNOSIS — Z79899 Other long term (current) drug therapy: Secondary | ICD-10-CM

## 2020-02-13 DIAGNOSIS — J9611 Chronic respiratory failure with hypoxia: Secondary | ICD-10-CM

## 2020-02-13 DIAGNOSIS — I4892 Unspecified atrial flutter: Secondary | ICD-10-CM | POA: Diagnosis present

## 2020-02-13 DIAGNOSIS — Z7951 Long term (current) use of inhaled steroids: Secondary | ICD-10-CM

## 2020-02-13 DIAGNOSIS — Z6841 Body Mass Index (BMI) 40.0 and over, adult: Secondary | ICD-10-CM

## 2020-02-13 DIAGNOSIS — J9621 Acute and chronic respiratory failure with hypoxia: Secondary | ICD-10-CM | POA: Diagnosis not present

## 2020-02-13 DIAGNOSIS — J441 Chronic obstructive pulmonary disease with (acute) exacerbation: Secondary | ICD-10-CM

## 2020-02-13 DIAGNOSIS — R5381 Other malaise: Secondary | ICD-10-CM | POA: Diagnosis not present

## 2020-02-13 DIAGNOSIS — C3490 Malignant neoplasm of unspecified part of unspecified bronchus or lung: Secondary | ICD-10-CM | POA: Diagnosis present

## 2020-02-13 DIAGNOSIS — E785 Hyperlipidemia, unspecified: Secondary | ICD-10-CM | POA: Diagnosis not present

## 2020-02-13 DIAGNOSIS — C3412 Malignant neoplasm of upper lobe, left bronchus or lung: Secondary | ICD-10-CM | POA: Diagnosis not present

## 2020-02-13 DIAGNOSIS — Z86011 Personal history of benign neoplasm of the brain: Secondary | ICD-10-CM

## 2020-02-13 DIAGNOSIS — M8580 Other specified disorders of bone density and structure, unspecified site: Secondary | ICD-10-CM | POA: Diagnosis not present

## 2020-02-13 DIAGNOSIS — I517 Cardiomegaly: Secondary | ICD-10-CM | POA: Diagnosis not present

## 2020-02-13 DIAGNOSIS — E538 Deficiency of other specified B group vitamins: Secondary | ICD-10-CM

## 2020-02-13 DIAGNOSIS — Z86711 Personal history of pulmonary embolism: Secondary | ICD-10-CM

## 2020-02-13 DIAGNOSIS — C3492 Malignant neoplasm of unspecified part of left bronchus or lung: Secondary | ICD-10-CM | POA: Diagnosis not present

## 2020-02-13 DIAGNOSIS — J449 Chronic obstructive pulmonary disease, unspecified: Secondary | ICD-10-CM | POA: Diagnosis not present

## 2020-02-13 DIAGNOSIS — I480 Paroxysmal atrial fibrillation: Secondary | ICD-10-CM | POA: Diagnosis present

## 2020-02-13 DIAGNOSIS — Z8249 Family history of ischemic heart disease and other diseases of the circulatory system: Secondary | ICD-10-CM

## 2020-02-13 DIAGNOSIS — R21 Rash and other nonspecific skin eruption: Secondary | ICD-10-CM | POA: Diagnosis not present

## 2020-02-13 DIAGNOSIS — N179 Acute kidney failure, unspecified: Secondary | ICD-10-CM | POA: Diagnosis present

## 2020-02-13 DIAGNOSIS — I4891 Unspecified atrial fibrillation: Secondary | ICD-10-CM | POA: Diagnosis not present

## 2020-02-13 DIAGNOSIS — C3432 Malignant neoplasm of lower lobe, left bronchus or lung: Secondary | ICD-10-CM | POA: Diagnosis not present

## 2020-02-13 DIAGNOSIS — Z833 Family history of diabetes mellitus: Secondary | ICD-10-CM

## 2020-02-13 DIAGNOSIS — Z9889 Other specified postprocedural states: Secondary | ICD-10-CM

## 2020-02-13 DIAGNOSIS — N189 Chronic kidney disease, unspecified: Secondary | ICD-10-CM | POA: Diagnosis not present

## 2020-02-13 DIAGNOSIS — Z825 Family history of asthma and other chronic lower respiratory diseases: Secondary | ICD-10-CM

## 2020-02-13 DIAGNOSIS — J4541 Moderate persistent asthma with (acute) exacerbation: Secondary | ICD-10-CM | POA: Diagnosis not present

## 2020-02-13 DIAGNOSIS — R911 Solitary pulmonary nodule: Secondary | ICD-10-CM | POA: Diagnosis not present

## 2020-02-13 HISTORY — PX: BRONCHIAL BIOPSY: SHX5109

## 2020-02-13 HISTORY — DX: Nontoxic multinodular goiter: E04.2

## 2020-02-13 HISTORY — PX: BRONCHIAL NEEDLE ASPIRATION BIOPSY: SHX5106

## 2020-02-13 HISTORY — PX: FIDUCIAL MARKER PLACEMENT: SHX6858

## 2020-02-13 HISTORY — PX: VIDEO BRONCHOSCOPY WITH ENDOBRONCHIAL NAVIGATION: SHX6175

## 2020-02-13 HISTORY — PX: BRONCHIAL BRUSHINGS: SHX5108

## 2020-02-13 LAB — PROTIME-INR
INR: 1 (ref 0.8–1.2)
Prothrombin Time: 13.1 seconds (ref 11.4–15.2)

## 2020-02-13 LAB — APTT: aPTT: 35 seconds (ref 24–36)

## 2020-02-13 SURGERY — VIDEO BRONCHOSCOPY WITH ENDOBRONCHIAL NAVIGATION
Anesthesia: General

## 2020-02-13 MED ORDER — LACTATED RINGERS IV SOLN: INTRAVENOUS | Status: DC

## 2020-02-13 MED ORDER — OMEPRAZOLE 40 MG PO CPDR: 40.0000 mg | DELAYED_RELEASE_CAPSULE | Freq: Every day | ORAL | Status: DC | PRN

## 2020-02-13 MED ORDER — FENTANYL CITRATE (PF) 100 MCG/2ML IJ SOLN: 25.0000 ug | INTRAMUSCULAR | Status: DC | PRN

## 2020-02-13 MED ORDER — BIOTENE DRY MOUTH GENTLE MT LIQD: 1.0000 | Freq: Two times a day (BID) | OROMUCOSAL | Status: DC | PRN

## 2020-02-13 MED ORDER — ALBUTEROL SULFATE (2.5 MG/3ML) 0.083% IN NEBU
2.5000 mg | INHALATION_SOLUTION | RESPIRATORY_TRACT | Status: DC | PRN
  Administered 2020-02-13 – 2020-02-14 (×3): 2.5 mg via RESPIRATORY_TRACT
  Filled 2020-02-13 (×3): qty 3

## 2020-02-13 MED ORDER — FENTANYL CITRATE (PF) 250 MCG/5ML IJ SOLN
INTRAMUSCULAR | Status: DC | PRN
  Administered 2020-02-13 (×2): 50 ug via INTRAVENOUS

## 2020-02-13 MED ORDER — CYANOCOBALAMIN 1000 MCG/ML IJ SOLN: 1000.0000 ug | INTRAMUSCULAR | Status: DC

## 2020-02-13 MED ORDER — SODIUM CHLORIDE 0.9% FLUSH: 3.0000 mL | INTRAVENOUS | Status: DC | PRN

## 2020-02-13 MED ORDER — LIDOCAINE 2% (20 MG/ML) 5 ML SYRINGE
INTRAMUSCULAR | Status: DC | PRN
  Administered 2020-02-13: 100 mg via INTRAVENOUS

## 2020-02-13 MED ORDER — ROCURONIUM BROMIDE 10 MG/ML (PF) SYRINGE
PREFILLED_SYRINGE | INTRAVENOUS | Status: DC | PRN
  Administered 2020-02-13: 40 mg via INTRAVENOUS

## 2020-02-13 MED ORDER — CLONIDINE HCL 0.3 MG/24HR TD PTWK
0.3000 mg | MEDICATED_PATCH | TRANSDERMAL | Status: DC
  Administered 2020-02-13: 0.3 mg via TRANSDERMAL
  Filled 2020-02-13: qty 1

## 2020-02-13 MED ORDER — RIVAROXABAN 20 MG PO TABS: 20.0000 mg | ORAL_TABLET | Freq: Every day | ORAL | Status: DC

## 2020-02-13 MED ORDER — ACETAMINOPHEN 325 MG PO TABS
ORAL_TABLET | ORAL | Status: AC
  Filled 2020-02-13: qty 2

## 2020-02-13 MED ORDER — SODIUM CHLORIDE 0.9 % IV SOLN: 250.0000 mL | INTRAVENOUS | Status: DC | PRN

## 2020-02-13 MED ORDER — ACETAMINOPHEN 325 MG PO TABS
650.0000 mg | ORAL_TABLET | Freq: Four times a day (QID) | ORAL | Status: DC | PRN
  Administered 2020-02-13 – 2020-02-16 (×5): 650 mg via ORAL
  Filled 2020-02-13 (×5): qty 2

## 2020-02-13 MED ORDER — FLUTICASONE PROPIONATE 50 MCG/ACT NA SUSP: 2.0000 | Freq: Every day | NASAL | Status: DC | PRN

## 2020-02-13 MED ORDER — LABETALOL HCL 5 MG/ML IV SOLN
INTRAVENOUS | Status: DC | PRN
Start: 2020-02-13 — End: 2020-02-13
  Administered 2020-02-13 (×2): 5 mg via INTRAVENOUS

## 2020-02-13 MED ORDER — FUROSEMIDE 40 MG PO TABS: 20.0000 mg | ORAL_TABLET | ORAL | Status: DC

## 2020-02-13 MED ORDER — NALOXONE HCL 0.4 MG/ML IJ SOLN
INTRAMUSCULAR | Status: DC | PRN
  Administered 2020-02-13 (×2): .1 mg via INTRAVENOUS

## 2020-02-13 MED ORDER — PROPOFOL 10 MG/ML IV BOLUS
INTRAVENOUS | Status: DC | PRN
  Administered 2020-02-13: 150 mg via INTRAVENOUS
  Administered 2020-02-13: 50 mg via INTRAVENOUS

## 2020-02-13 MED ORDER — ACETAMINOPHEN 650 MG RE SUPP
650.0000 mg | Freq: Four times a day (QID) | RECTAL | Status: DC | PRN
  Filled 2020-02-13: qty 1

## 2020-02-13 MED ORDER — POLYETHYLENE GLYCOL 3350 17 G PO PACK: 17.0000 g | PACK | Freq: Every day | ORAL | Status: DC | PRN

## 2020-02-13 MED ORDER — CHLORHEXIDINE GLUCONATE 0.12 % MT SOLN
OROMUCOSAL | Status: AC
  Administered 2020-02-13: 15 mL via OROMUCOSAL
  Filled 2020-02-13: qty 15

## 2020-02-13 MED ORDER — ALBUTEROL SULFATE (2.5 MG/3ML) 0.083% IN NEBU: 2.5000 mg | INHALATION_SOLUTION | Freq: Four times a day (QID) | RESPIRATORY_TRACT | Status: DC | PRN

## 2020-02-13 MED ORDER — PROAIR HFA 108 (90 BASE) MCG/ACT IN AERS: 2.0000 | INHALATION_SPRAY | RESPIRATORY_TRACT | Status: DC | PRN

## 2020-02-13 MED ORDER — CHLORHEXIDINE GLUCONATE 0.12 % MT SOLN
15.0000 mL | Freq: Once | OROMUCOSAL | Status: AC
  Filled 2020-02-13: qty 15

## 2020-02-13 MED ORDER — BICTEGRAVIR-EMTRICITAB-TENOFOV 50-200-25 MG PO TABS
1.0000 | ORAL_TABLET | Freq: Every day | ORAL | Status: DC
  Administered 2020-02-13 – 2020-02-17 (×5): 1 via ORAL
  Filled 2020-02-13 (×5): qty 1

## 2020-02-13 MED ORDER — DILTIAZEM HCL ER COATED BEADS 240 MG PO CP24
240.0000 mg | ORAL_CAPSULE | Freq: Every day | ORAL | Status: DC
  Administered 2020-02-13 – 2020-02-17 (×5): 240 mg via ORAL
  Filled 2020-02-13 (×5): qty 1

## 2020-02-13 MED ORDER — SUGAMMADEX SODIUM 200 MG/2ML IV SOLN
INTRAVENOUS | Status: DC | PRN
  Administered 2020-02-13: 200 mg via INTRAVENOUS

## 2020-02-13 MED ORDER — SUCCINYLCHOLINE CHLORIDE 200 MG/10ML IV SOSY
PREFILLED_SYRINGE | INTRAVENOUS | Status: DC | PRN
  Administered 2020-02-13: 120 mg via INTRAVENOUS

## 2020-02-13 MED ORDER — PHENYLEPHRINE HCL-NACL 10-0.9 MG/250ML-% IV SOLN
INTRAVENOUS | Status: DC | PRN
  Administered 2020-02-13: 40 ug/min via INTRAVENOUS

## 2020-02-13 MED ORDER — FLUTICASONE FUROATE-VILANTEROL 200-25 MCG/INH IN AEPB
1.0000 | INHALATION_SPRAY | Freq: Every day | RESPIRATORY_TRACT | Status: DC
  Administered 2020-02-14 – 2020-02-15 (×2): 1 via RESPIRATORY_TRACT
  Filled 2020-02-13: qty 28

## 2020-02-13 MED ORDER — SODIUM CHLORIDE 0.9% FLUSH
3.0000 mL | Freq: Two times a day (BID) | INTRAVENOUS | Status: DC
  Administered 2020-02-13 – 2020-02-16 (×6): 3 mL via INTRAVENOUS

## 2020-02-13 MED ORDER — ONDANSETRON HCL 4 MG/2ML IJ SOLN
INTRAMUSCULAR | Status: DC | PRN
  Administered 2020-02-13: 4 mg via INTRAVENOUS

## 2020-02-13 SURGICAL SUPPLY — 1 items: Super lock Fiducial marker ×12 IMPLANT

## 2020-02-13 NOTE — Telephone Encounter (Signed)
Agree, not urgent for biopsy, but good idea to schedule at same time given holding AC.    Thanks all

## 2020-02-13 NOTE — Telephone Encounter (Signed)
ATC pt, no answer. Left message for pt to call back.  

## 2020-02-13 NOTE — Progress Notes (Signed)
RT note-Called to PACU to place patient on ventilator, Dr. Lamonte Sakai at bedside as well, patient waking up and placed on cpap/ps, tolerating well and then extubated by Anesthesiologist.

## 2020-02-13 NOTE — Transfer of Care (Signed)
Immediate Anesthesia Transfer of Care Note  Patient: Shannon [name]  Procedure(s) Performed: VIDEO BRONCHOSCOPY WITH ENDOBRONCHIAL NAVIGATION (N/A ) BRONCHIAL BRUSHINGS BRONCHIAL NEEDLE ASPIRATION BIOPSIES BRONCHIAL BIOPSIES FIDUCIAL MARKER PLACEMENT  Patient Location: PACU  Anesthesia Type:General  Level of Consciousness: drowsy and Patient remains intubated per anesthesia plan  Airway & Oxygen Therapy: Patient Spontanous Breathing  Post-op Assessment: Report given to RN and Post -op Vital signs reviewed and stable  Post vital signs: Reviewed and stable  Last Vitals:  Vitals Value Taken Time  BP 159/89 02/13/20 1555  Temp 36.4 C 02/13/20 1555  Pulse 78 02/13/20 1600  Resp 25 02/13/20 1600  SpO2 100 % 02/13/20 1600  Vitals shown include unvalidated device data.  Last Pain:  Vitals:   02/13/20 1135  TempSrc: Oral         Complications: No complications documented.

## 2020-02-13 NOTE — Discharge Instructions (Signed)
Flexible Bronchoscopy, Care After This sheet gives you information about how to care for yourself after your test. Your doctor may also give you more specific instructions. If you have problems or questions, contact your doctor. Follow these instructions at home: Eating and drinking  Do not eat or drink anything (not even water) for 2 hours after your test, or until your numbing medicine (local anesthetic) wears off.  When your numbness is gone and your cough and gag reflexes have come back, you may: ? Eat only soft foods. ? Slowly drink liquids.  The day after the test, go back to your normal diet. Driving  Do not drive for 24 hours if you were given a medicine to help you relax (sedative).  Do not drive or use heavy machinery while taking prescription pain medicine. General instructions   Take over-the-counter and prescription medicines only as told by your doctor.  Return to your normal activities as told. Ask what activities are safe for you.  Do not use any products that have nicotine or tobacco in them. This includes cigarettes and e-cigarettes. If you need help quitting, ask your doctor.  Keep all follow-up visits as told by your doctor. This is important. It is very important if you had a tissue sample (biopsy) taken. Get help right away if:  You have shortness of breath that gets worse.  You get light-headed.  You feel like you are going to pass out (faint).  You have chest pain.  You cough up: ? More than a little blood. ? More blood than before. Summary  Do not eat or drink anything (not even water) for 2 hours after your test, or until your numbing medicine wears off.  Do not use cigarettes. Do not use e-cigarettes.  Get help right away if you have chest pain. This information is not intended to replace advice given to you by your health care provider. Make sure you discuss any questions you have with your health care provider.  Please call our office for  any questions or concerns.  [phone].  You may restart your Xarelto on Thursday, 02/15/2020.  Document Revised: 07/23/2017 Document Reviewed: 08/28/2016 Elsevier Patient Education  2020 [employer].

## 2020-02-13 NOTE — H&P (Signed)
NAME:  Shannon [name], MRN:  [id number], DOB:  07/14/1956, LOS: 0 ADMISSION DATE:  02/13/2020, CHIEF COMPLAINT:  Pulmonary Nodule   Brief History     History of present illness   64 yo obese never smoker w HIV, Severe persistent asthma, A fib / flutter on xarelto. She underwent ENB to evaluate LUL pulmonary nodule on 6/22. She was slow to recover from anesthesia, but was able to be extubated in the PACU. She is admitted for observation to insure stability post-procedure.   Past Medical History   Past Medical History:  Diagnosis Date  . Anemia   . Anxiety    HX PANIC ATTACKS  . Arthritis    "starting to; in my hands" (07/09/2015)  . Asthma   . Atrial fibrillation ([place])   . Atrial flutter, paroxysmal ([place])   . Bloated abdomen   . CFS (chronic fatigue syndrome)   . Chewing difficulty   . Chronic asthma with acute exacerbation    "I have chronic asthma all the time; sometimes exacerbations" (07/09/2015)  . Chronic lower back pain   . COPD (chronic obstructive pulmonary disease) ([place])   . Cyst of right kidney    "3 of them; dx'd in ~ 01/2015"  . Dyspnea   . GERD (gastroesophageal reflux disease)   . Heart murmur   . History of blood transfusion    "related to my brain surgery I think"  . History of pulmonary embolism 07/09/2015  . HIV antibody positive ([place])   . HIV disease ([place])   . Hyperlipidemia   . Hypertension   . Leg edema   . Lipodystrophy   . Mild CAD 2013  . Multiple thyroid nodules   . Osteopenia   . Palpitations   . Pneumonia 07/09/2015  . Shingles   . Sleep apnea    "never completed part 2 of study; never wore mask" (07/09/2015)  . Vitamin B 12 deficiency   . Vitamin D deficiency      Significant Hospital Events     Consults:    Procedures:  ENB 6/22, LUL nodule biopsies  Significant Diagnostic Tests:    Micro Data:    Antimicrobials:     Interim history/subjective:   Extubated in PACU  Objective   Blood pressure (!) 163/93, pulse  95, temperature 98.3 F (36.8 C), temperature source Oral, resp. rate 18, weight 105.1 kg, SpO2 100 %.    Vent Mode: PSV;CPAP FiO2 (%):  [40 %] 40 % Set Rate:  [430 bmp] 430 bmp PEEP:  [5 cmH20] 5 cmH20 Pressure Support:  [8 cmH20] 8 cmH20  No intake or output data in the 24 hours ending 02/13/20 2212 Filed Weights   02/13/20 2049  Weight: 105.1 kg    Examination: General: Elderly obese woman, chronically ill, NAD HENT: OP clear, pupils 21mm Lungs: slightly tachypneic, distant, few basilar crackles, end exp wheeze Cardiovascular: irregular, distant Abdomen: obese, non-distended Extremities: 1+ ankle edema Neuro: awake, sleepy but wakes easily to voice, perseverates occasionally but redirects, moves all ext  Resolved Hospital Problem list     Assessment & Plan:   LUL Nodule, s/p general anesthesia for ENB.  - minimize sedating meds - push pulm hygiene - ABG if any signs of hypersomnolence, lethargy - wean O2  Asthma - continue home Breo, albuterol prn  A Fib / flutter - Xarelto on hold post-procedure  HTN - continue home clonidine, diltiazem - hold lasix, aldactone. Restart at discharge  HIV - continue home [facility]   CBC: No results for input(s): WBC, NEUTROABS, HGB, HCT, MCV, PLT in the last 168 hours.  Basic Metabolic Panel: No results for input(s): NA, K, CL, CO2, GLUCOSE, BUN, CREATININE, CALCIUM, MG, PHOS in the last 168 hours. GFR: Estimated Creatinine Clearance: 40.1 mL/min (A) (by C-G formula based on SCr of 1.64 mg/dL (H)). No results for input(s): PROCALCITON, WBC, LATICACIDVEN in the last 168 hours.  Liver Function Tests: No results for input(s): AST, ALT, ALKPHOS, BILITOT, PROT, ALBUMIN in the last 168 hours. No results for input(s): LIPASE, AMYLASE in the last 168 hours. No results for input(s): AMMONIA in the last 168 hours.  ABG    Component Value Date/Time   PHART 7.423 09/18/2016 1722   PCO2ART 43.2 09/18/2016 1722   PO2ART  63.0 (L) 09/18/2016 1722   HCO3 28.2 (H) 09/18/2016 1722   TCO2 30 09/18/2016 1722   O2SAT 92.0 09/18/2016 1722     Coagulation Profile: Recent Labs  Lab 02/13/20 1242  INR 1.0    Cardiac Enzymes: No results for input(s): CKTOTAL, CKMB, CKMBINDEX, TROPONINI in the last 168 hours.  HbA1C: Hgb A1c MFr Bld  Date/Time Value Ref Range Status  09/07/2019 02:41 PM 5.7 (H) <5.7 % of total Hgb Final    Comment:    For someone without known diabetes, a hemoglobin  A1c value between 5.7% and 6.4% is consistent with prediabetes and should be confirmed with a  follow-up test. . For someone with known diabetes, a value <7% indicates that their diabetes is well controlled. A1c targets should be individualized based on duration of diabetes, age, comorbid conditions, and other considerations. . This assay result is consistent with an increased risk of diabetes. . Currently, no consensus exists regarding use of hemoglobin A1c for diagnosis of diabetes for children. .   06/30/2018 10:16 AM 5.6 4.8 - 5.6 % Final    Comment:             Prediabetes: 5.7 - 6.4          Diabetes: >6.4          Glycemic control for adults with diabetes: <7.0     CBG: No results for input(s): GLUCAP in the last 168 hours.  Review of Systems:   As per HPI  Past Medical History  She,  has a past medical history of Anemia, Anxiety, Arthritis, Asthma, Atrial fibrillation ([place]), Atrial flutter, paroxysmal ([place]), Bloated abdomen, CFS (chronic fatigue syndrome), Chewing difficulty, Chronic asthma with acute exacerbation, Chronic lower back pain, COPD (chronic obstructive pulmonary disease) ([place]), Cyst of right kidney, Dyspnea, GERD (gastroesophageal reflux disease), Heart murmur, History of blood transfusion, History of pulmonary embolism (07/09/2015), HIV antibody positive ([place]), HIV disease ([place]), Hyperlipidemia, Hypertension, Leg edema, Lipodystrophy, Mild CAD (2013), Multiple thyroid nodules, Osteopenia,  Palpitations, Pneumonia (07/09/2015), Shingles, Sleep apnea, Vitamin B 12 deficiency, and Vitamin D deficiency.   Surgical History    Past Surgical History:  Procedure Laterality Date  . ABDOMINAL HYSTERECTOMY     "robotic laparosopic"  . BRAIN SURGERY  1974   "brain tumor; benign; on top of my brain; got a plate in there"  . BRAIN SURGERY     age 56- -"Tumor pushing my skullout"  . CARDIAC CATHETERIZATION    . TONSILLECTOMY AND ADENOIDECTOMY       Social History   reports that she has never smoked. She has never used smokeless tobacco. She reports previous alcohol use. She reports that she does not use drugs.  Family History   Her family history includes Asthma in her mother; Diabetes in her sister; Heart disease in her mother; Heart failure in her mother; Heart murmur in her brother, sister, and sister; Hypertension in her mother; Obesity in her mother; Thyroid disease in her mother and sister.   Allergies Allergies  Allergen Reactions  . Tree Extract Swelling and Other (See Comments)    Swelling to eyes  . Augmentin [Amoxicillin-Pot Clavulanate] Other (See Comments)    Headache, dizzy  . Lisinopril Cough    Face/throat swelling  . Ciprofloxacin Hives     Home Medications  Prior to Admission medications   Medication Sig Start Date End Date Taking? Authorizing Provider  acetaminophen (TYLENOL) 325 MG tablet Take 325 mg by mouth every 6 (six) hours as needed (FOR PAIN).   Yes [provider]  Ascorbic Acid (VITAMIN C) 1000 MG tablet Take 1,000 mg by mouth every other day.    Yes [provider]  b complex vitamins tablet Take 1 tablet by mouth daily.   Yes [provider]  bictegravir-emtricitabine-tenofovir AF (BIKTARVY) 50-200-25 MG TABS tablet Take 1 tablet by mouth daily. 10/30/19  Yes [name], MD  BIOTIN PO Take 1 tablet by mouth daily as needed (for supplementation).    Yes [provider]  calcium carbonate (OSCAL) 1500 (600  Ca) MG TABS tablet Take 600 mg of elemental calcium by mouth daily with breakfast.   Yes [provider]  Cholecalciferol (SM VITAMIN D3) 100 MCG (4000 UT) CAPS Take 1 capsule (4,000 Units total) by mouth daily. 04/18/19  Yes Abby Potash, PA-C  cloNIDine (CATAPRES - DOSED IN MG/24 HR) 0.3 mg/24hr patch Place 0.3 mg onto the skin once a week.  12/29/19  Yes [provider]  diltiazem (CARDIZEM CD) 240 MG 24 hr capsule TAKE 1 CAPSULE(240 MG) BY MOUTH DAILY Patient taking differently: Take 240 mg by mouth daily.  07/03/19  Yes [name], MD  fluticasone furoate-vilanterol (BREO ELLIPTA) 200-25 MCG/INH AEPB Inhale 1 puff into the lungs daily. 02/05/20  Yes Rigoberto Noel, MD  MAGNESIUM PO Take 1 tablet by mouth every other day.    Yes [provider]  Melatonin 5 MG TABS Take 5 mg by mouth at bedtime as needed (for sleep).   Yes [provider]  Omega-3 Fatty Acids (FISH OIL) 1000 MG CAPS Take 1,000 mg by mouth daily as needed (pt prefrence).   Yes [provider]  OXYGEN Inhale 1.5-2 L into the lungs See admin instructions. Uses at bedtime and through the day as needed   Yes [provider]  PREVIDENT 5000 SENSITIVE 1.1-5 % PSTE Use daily as directed 01/24/18  Yes [provider]  senna (SENOKOT) 8.6 MG tablet Take 1 tablet by mouth daily as needed for constipation.   Yes [provider]  spironolactone (ALDACTONE) 25 MG tablet Take 1 tablet (25 mg total) by mouth daily. 09/05/19 02/08/20 Yes Skeet Latch, MD  SYRINGE/NEEDLE, DISP, 1 ML (B-D SYRINGE/NEEDLE 1CC/25GX5/8) 25G X 5/8" 1 ML MISC 1 Units by Does not apply route every 30 (thirty) days. 04/14/19  Yes [name], MD  vitamin A 3 MG (10000 UNITS) capsule Take 10,000 Units by mouth daily.   Yes [provider]  zinc gluconate 50 MG tablet Take 50 mg by mouth daily.   Yes [provider]  albuterol (PROVENTIL) (2.5 MG/3ML) 0.083% nebulizer solution  Take 3 mLs (2.5 mg total) by nebulization every 6 (six)  hours as needed for wheezing or shortness of breath. USE 1 VIAL VIA NEBULIZER EVERY 6 HOURS AS NEEDED FOR WHEEZING OR SHORTNESS OF BREATH 02/13/20   [name], MD  cyanocobalamin (,VITAMIN B-12,) 1000 MCG/ML injection Inject 1 mL (1,000 mcg total) into the muscle every 30 (thirty) days. ADMINISTER 1 ML(1000 MCG) IN THE MUSCLE EVERY 30 DAYS 02/13/20   [name], MD  fluticasone [facility]) 50 MCG/ACT nasal spray Place 2 sprays into both nostrils daily as needed for allergies. 02/13/20   [name], MD  furosemide (LASIX) 40 MG tablet Take 0.5 tablets (20 mg total) by mouth every other day. 02/13/20 05/13/20  [name], MD  Misc. Devices (PULSE OXIMETER FOR FINGER) MISC 1 Units by Does not apply route as needed. 01/19/19   Skeet Latch, MD  Mouthwashes (BIOTENE DRY MOUTH GENTLE) LIQD Use as directed 1 Dose in the mouth or throat 2 (two) times daily as needed (dry mouth). 02/13/20   [name], MD  omeprazole (PRILOSEC) 40 MG capsule Take 1 capsule (40 mg total) by mouth daily as needed (heart burn). 02/13/20   [name], MD  polyethylene glycol (MIRALAX / GLYCOLAX) 17 g packet Take 17 g by mouth daily as needed for mild constipation. 02/13/20   [name], MD  PROAIR HFA 108 [phone] Base) MCG/ACT inhaler Inhale 2 puffs into the lungs every 4 (four) hours as needed for wheezing or shortness of breath. INHALE 2 PUFFS INTO THE LUNGS EVERY 4 HOURS AS NEEDED FOR WHEEZING OR SHORTNESS OF BREATH 02/13/20   [name], MD  rivaroxaban (XARELTO) 20 MG TABS tablet Take 1 tablet (20 mg total) by mouth daily with supper. Okay to restart this medication on Thursday, 02/15/2020. 02/13/20   [name], MD     Critical care time: NA      Baltazar Apo, MD, PhD 02/13/2020, 10:35 PM [facility] [phone] or if no answer [phone]

## 2020-02-13 NOTE — Op Note (Signed)
Video Bronchoscopy with Electromagnetic Navigation Procedure Note  Date of Operation: 02/13/2020  Pre-op Diagnosis: Left upper lobe nodule  Post-op Diagnosis: Left upper lobe nodule  Surgeon: Baltazar Apo  Assistants: None  Anesthesia: General endotracheal anesthesia  Operation: Flexible video fiberoptic bronchoscopy with electromagnetic navigation and biopsies.  Estimated Blood Loss: Minimal  Complications: None apparent  Indications and History: Shannon [name] is a 64 y.o. female, never smoker, with history of HIV, atrial fibrillation/flutter on Xarelto, asthma.  She was found to have enlarging left upper lobe nodule on was hypermetabolic on PET scan done 01/26/2020.  Recommendation was made to achieve a tissue diagnosis via navigational bronchoscopy with biopsies.  The risks, benefits, complications, treatment options and expected outcomes were discussed with the patient.  The possibilities of pneumothorax, pneumonia, reaction to medication, pulmonary aspiration, perforation of a viscus, bleeding, failure to diagnose a condition and creating a complication requiring transfusion or operation were discussed with the patient who freely signed the consent.    Description of Procedure: The patient was seen in the Preoperative Area, was examined and was deemed appropriate to proceed.  The patient was taken to [facility] endoscopy room 2, identified as WATEEN VARON and the procedure verified as Flexible Video Fiberoptic Bronchoscopy.  A Time Out was held and the above information confirmed.   Prior to the date of the procedure a high-resolution CT scan of the chest was performed. Utilizing Hydro a virtual tracheobronchial tree was generated to allow the creation of distinct navigation pathways to the patient's parenchymal abnormalities. After being taken to the operating room general anesthesia was initiated and the patient  was orally intubated. The video fiberoptic  bronchoscope was introduced via the endotracheal tube and a general inspection was performed which showed erythematous somewhat swollen and collapsible airways but no endobronchial lesions or abnormal secretions. The extendable working channel and locator guide were introduced into the bronchoscope. The distinct navigation pathways prepared prior to this procedure were then utilized to navigate to within 0.5cm of patient's left upper lobe nodule identified on CT scan. The extendable working channel was secured into place and the locator guide was withdrawn. Under fluoroscopic guidance transbronchial needle brushings, transbronchial Wang needle biopsies, and transbronchial forceps biopsies were performed to be sent for cytology and pathology.  3 fiducial markers were placed triangulating the nodule to facilitate XRT should this become indicated. At the end of the procedure a general airway inspection was performed and there was no evidence of active bleeding. The bronchoscope was removed.  The patient tolerated the procedure well. There was no significant blood loss and there were no obvious complications. A post-procedural chest x-ray is pending.  Samples: 1. Transbronchial needle brushings from left upper lobe nodule 2. Transbronchial Wang needle biopsies from left upper lobe nodule 3. Transbronchial forceps biopsies from left upper lobe nodule  Plans:  The patient will be discharged from the PACU to home when recovered from anesthesia and after chest x-ray is reviewed. We will review the cytology, pathology and microbiology results with the patient when they become available. Outpatient followup will be with Dr Elsworth Soho or Dr Lamonte Sakai.    Baltazar Apo, MD, PhD 02/13/2020, 3:32 PM [facility] [phone] or if no answer [phone]

## 2020-02-13 NOTE — Anesthesia Preprocedure Evaluation (Signed)
Anesthesia Evaluation  Patient identified by MRN, date of birth, ID band Patient awake    Reviewed: Allergy & Precautions, NPO status   Airway Mallampati: II  TM Distance: >3 FB     Dental   Pulmonary shortness of breath, asthma , sleep apnea , pneumonia, COPD,    breath sounds clear to auscultation       Cardiovascular hypertension, + CAD  + Valvular Problems/Murmurs  Rhythm:Regular Rate:Normal     Neuro/Psych Anxiety  Neuromuscular disease    GI/Hepatic Neg liver ROS, GERD  ,  Endo/Other    Renal/GU Renal disease     Musculoskeletal  (+) Arthritis ,   Abdominal   Peds  Hematology  (+) anemia ,   Anesthesia Other Findings   Reproductive/Obstetrics                             Anesthesia Physical Anesthesia Plan  ASA: III  Anesthesia Plan: General   Post-op Pain Management:    Induction:   PONV Risk Score and Plan: 3 and Midazolam, Dexamethasone and Ondansetron  Airway Management Planned: Oral ETT  Additional Equipment:   Intra-op Plan:   Post-operative Plan:   Informed Consent: I have reviewed the patients History and Physical, chart, labs and discussed the procedure including the risks, benefits and alternatives for the proposed anesthesia with the patient or authorized representative who has indicated his/her understanding and acceptance.     Dental advisory given  Plan Discussed with: CRNA and Anesthesiologist  Anesthesia Plan Comments:         Anesthesia Quick Evaluation

## 2020-02-13 NOTE — Anesthesia Postprocedure Evaluation (Signed)
Anesthesia Post Note  Patient: Kahealani Yankovich Henry  Procedure(s) Performed: VIDEO BRONCHOSCOPY WITH ENDOBRONCHIAL NAVIGATION (N/A ) BRONCHIAL BRUSHINGS BRONCHIAL NEEDLE ASPIRATION BIOPSIES BRONCHIAL BIOPSIES FIDUCIAL MARKER PLACEMENT     Patient location during evaluation: PACU Anesthesia Type: General Level of consciousness: awake Pain management: pain level controlled Vital Signs Assessment: post-procedure vital signs reviewed and stable Respiratory status: spontaneous breathing Cardiovascular status: stable Postop Assessment: no apparent nausea or vomiting Anesthetic complications: no   No complications documented.  Last Vitals:  Vitals:   02/13/20 1715 02/13/20 1725  BP:  (!) 156/100  Pulse: 86 79  Resp: 19 13  Temp:    SpO2: 100% 100%    Last Pain:  Vitals:   02/13/20 1655  TempSrc:   PainSc: 0-No pain                 Tnya Ades

## 2020-02-13 NOTE — Telephone Encounter (Signed)
Please let pt know that I have d/w Dr Julien Nordmann & feel that she needs thyroid biopsy - not urgent but best to schedule both around same time so that we can get both these by stopping her blood thinner & covid testing

## 2020-02-13 NOTE — Anesthesia Procedure Notes (Signed)
Procedure Name: Intubation Performed by: Milford Cage, CRNA Pre-anesthesia Checklist: Patient identified, Emergency Drugs available, Suction available and Patient being monitored Patient Re-evaluated:Patient Re-evaluated prior to induction Oxygen Delivery Method: Circle System Utilized Preoxygenation: Pre-oxygenation with 100% oxygen Induction Type: IV induction and Rapid sequence Laryngoscope Size: Miller and 2 Grade View: Grade I Tube type: Oral Tube size: 8.5 mm Number of attempts: 1 Airway Equipment and Method: Stylet and Oral airway Placement Confirmation: ETT inserted through vocal cords under direct vision,  positive ETCO2 and breath sounds checked- equal and bilateral Secured at: 22 cm Tube secured with: Tape Dental Injury: Teeth and Oropharynx as per pre-operative assessment

## 2020-02-13 NOTE — Interval H&P Note (Signed)
History and Physical Interval Note:  02/13/2020 12:17 PM  Shannon [name]  has presented today for surgery, with the diagnosis of LEFT UPPER LOPE NODULE.  The various methods of treatment have been discussed with the patient and family. After consideration of risks, benefits and other options for treatment, the patient has consented to  Procedure(s): [name] (N/A) as a surgical intervention.  The patient's history has been reviewed, patient examined, no change in status, stable for surgery.  I have reviewed the patient's chart and labs.  Questions were answered to the patient's satisfaction.     [name]

## 2020-02-14 DIAGNOSIS — M2550 Pain in unspecified joint: Secondary | ICD-10-CM | POA: Diagnosis not present

## 2020-02-14 DIAGNOSIS — Z743 Need for continuous supervision: Secondary | ICD-10-CM | POA: Diagnosis not present

## 2020-02-14 DIAGNOSIS — Z86011 Personal history of benign neoplasm of the brain: Secondary | ICD-10-CM | POA: Diagnosis not present

## 2020-02-14 DIAGNOSIS — R269 Unspecified abnormalities of gait and mobility: Secondary | ICD-10-CM | POA: Diagnosis not present

## 2020-02-14 DIAGNOSIS — J4541 Moderate persistent asthma with (acute) exacerbation: Secondary | ICD-10-CM

## 2020-02-14 DIAGNOSIS — I1310 Hypertensive heart and chronic kidney disease without heart failure, with stage 1 through stage 4 chronic kidney disease, or unspecified chronic kidney disease: Secondary | ICD-10-CM | POA: Diagnosis present

## 2020-02-14 DIAGNOSIS — Z79899 Other long term (current) drug therapy: Secondary | ICD-10-CM | POA: Diagnosis not present

## 2020-02-14 DIAGNOSIS — J9601 Acute respiratory failure with hypoxia: Secondary | ICD-10-CM | POA: Diagnosis not present

## 2020-02-14 DIAGNOSIS — I517 Cardiomegaly: Secondary | ICD-10-CM | POA: Diagnosis not present

## 2020-02-14 DIAGNOSIS — J4551 Severe persistent asthma with (acute) exacerbation: Secondary | ICD-10-CM | POA: Diagnosis present

## 2020-02-14 DIAGNOSIS — M8580 Other specified disorders of bone density and structure, unspecified site: Secondary | ICD-10-CM | POA: Diagnosis present

## 2020-02-14 DIAGNOSIS — I483 Typical atrial flutter: Secondary | ICD-10-CM | POA: Diagnosis not present

## 2020-02-14 DIAGNOSIS — Z86711 Personal history of pulmonary embolism: Secondary | ICD-10-CM | POA: Diagnosis not present

## 2020-02-14 DIAGNOSIS — I10 Essential (primary) hypertension: Secondary | ICD-10-CM | POA: Diagnosis not present

## 2020-02-14 DIAGNOSIS — N1831 Chronic kidney disease, stage 3a: Secondary | ICD-10-CM | POA: Diagnosis present

## 2020-02-14 DIAGNOSIS — C3412 Malignant neoplasm of upper lobe, left bronchus or lung: Secondary | ICD-10-CM | POA: Diagnosis present

## 2020-02-14 DIAGNOSIS — R911 Solitary pulmonary nodule: Secondary | ICD-10-CM | POA: Diagnosis present

## 2020-02-14 DIAGNOSIS — Z6841 Body Mass Index (BMI) 40.0 and over, adult: Secondary | ICD-10-CM | POA: Diagnosis not present

## 2020-02-14 DIAGNOSIS — Z7901 Long term (current) use of anticoagulants: Secondary | ICD-10-CM | POA: Diagnosis not present

## 2020-02-14 DIAGNOSIS — R918 Other nonspecific abnormal finding of lung field: Secondary | ICD-10-CM | POA: Diagnosis not present

## 2020-02-14 DIAGNOSIS — R0902 Hypoxemia: Secondary | ICD-10-CM | POA: Diagnosis not present

## 2020-02-14 DIAGNOSIS — R6889 Other general symptoms and signs: Secondary | ICD-10-CM | POA: Diagnosis not present

## 2020-02-14 DIAGNOSIS — R0689 Other abnormalities of breathing: Secondary | ICD-10-CM | POA: Diagnosis not present

## 2020-02-14 DIAGNOSIS — E041 Nontoxic single thyroid nodule: Secondary | ICD-10-CM | POA: Diagnosis present

## 2020-02-14 DIAGNOSIS — Z7951 Long term (current) use of inhaled steroids: Secondary | ICD-10-CM | POA: Diagnosis not present

## 2020-02-14 DIAGNOSIS — Z7401 Bed confinement status: Secondary | ICD-10-CM | POA: Diagnosis not present

## 2020-02-14 DIAGNOSIS — K219 Gastro-esophageal reflux disease without esophagitis: Secondary | ICD-10-CM | POA: Diagnosis present

## 2020-02-14 DIAGNOSIS — I4892 Unspecified atrial flutter: Secondary | ICD-10-CM | POA: Diagnosis present

## 2020-02-14 DIAGNOSIS — C801 Malignant (primary) neoplasm, unspecified: Secondary | ICD-10-CM | POA: Diagnosis not present

## 2020-02-14 DIAGNOSIS — Z9981 Dependence on supplemental oxygen: Secondary | ICD-10-CM | POA: Diagnosis not present

## 2020-02-14 DIAGNOSIS — J449 Chronic obstructive pulmonary disease, unspecified: Secondary | ICD-10-CM | POA: Diagnosis present

## 2020-02-14 DIAGNOSIS — J9621 Acute and chronic respiratory failure with hypoxia: Secondary | ICD-10-CM | POA: Diagnosis present

## 2020-02-14 DIAGNOSIS — Z8249 Family history of ischemic heart disease and other diseases of the circulatory system: Secondary | ICD-10-CM | POA: Diagnosis not present

## 2020-02-14 DIAGNOSIS — Z21 Asymptomatic human immunodeficiency virus [HIV] infection status: Secondary | ICD-10-CM | POA: Diagnosis present

## 2020-02-14 DIAGNOSIS — N179 Acute kidney failure, unspecified: Secondary | ICD-10-CM | POA: Diagnosis present

## 2020-02-14 DIAGNOSIS — I480 Paroxysmal atrial fibrillation: Secondary | ICD-10-CM | POA: Diagnosis present

## 2020-02-14 LAB — SURGICAL PATHOLOGY

## 2020-02-14 LAB — CYTOLOGY - NON PAP

## 2020-02-14 MED ORDER — PHENOL 1.4 % MT LIQD
1.0000 | OROMUCOSAL | Status: DC | PRN
  Filled 2020-02-14: qty 177

## 2020-02-14 MED ORDER — PREDNISONE 20 MG PO TABS
40.0000 mg | ORAL_TABLET | Freq: Every day | ORAL | Status: DC
  Administered 2020-02-15: 40 mg via ORAL
  Filled 2020-02-14: qty 2

## 2020-02-14 NOTE — TOC Initial Note (Signed)
Transition of Care [facility]) - Initial/Assessment Note    Patient Details  Name: Shannon [name] MRN: [id number] Date of Birth: 1956/03/14  Transition of Care [facility]) CM/SW Contact:    Angelita Ingles, RN Phone Number: [phone]  02/14/2020, 11:56 AM  Clinical Narrative:  CM consulted for DME needs. Patient states that she is from home alone and previously had a bed set up to be delivered to the home. Patient states that she needs bedside table, air purifier and adjustable bedside commode. [employer] notified and verified that the patient is on file and has a bed that is set for delivery when the patient calls back with new delivery date due to patient being in hospital and unable to accept delivery today. Bedside table and BSC have been ordered. Air purifier not covered by [employer]. CM will continue to follow for any needs.  Expected Discharge Plan: Home/Self Care Barriers to Discharge: Continued Medical Work up   Patient Goals and CMS Choice Patient states their goals for this hospitalization and ongoing recovery are:: Wants to go home and feel better   Choice offered to / list presented to : NA  Expected Discharge Plan and Services Expected Discharge Plan: Home/Self Care In-house Referral: NA Discharge Planning Services: CM Consult Post Acute Care Choice: NA Living arrangements for the past 2 months: Single Family Home Expected Discharge Date: 02/13/20               DME Arranged: Bedside commode, Other see comment (bedside table) DME Agency: [employer] Date DME Agency Contacted: 02/14/20 Time DME Agency Contacted: [phone] Representative spoke with at DME Agency: [place]: NA [place] Agency: NA        Prior Living Arrangements/Services Living arrangements for the past 2 months: [place] Lives with:: Self Patient language and need for interpreter reviewed:: Yes Do you feel safe going back to the place where you live?: Yes      Need for Family Participation in  Patient Care: Yes (Comment)   Current home services: DME (O2 at bedtime) Criminal Activity/Legal Involvement Pertinent to Current Situation/Hospitalization: No - Comment as needed  Activities of Daily Living      Permission Sought/Granted   Permission granted to share information with : No              Emotional Assessment Appearance:: Appears older than stated age Attitude/Demeanor/Rapport: Engaged Affect (typically observed): Accepting, Calm Orientation: : Oriented to Self, Oriented to Place, Oriented to  Time, Oriented to Situation Alcohol / Substance Use: Not Applicable Psych Involvement: No (comment)  Admission diagnosis:  Lung nodule [R91.1] Patient Active Problem List   Diagnosis Date Noted  . Nodule of upper lobe of left lung 02/06/2020  . Thyroid nodule 01/26/2020  . Lung nodule 01/04/2020  . Chronic obstructive asthma ([place]) 12/29/2019  . Myalgia 10/24/2019  . (HFpEF) heart failure with preserved ejection fraction ([place]) 02/07/2019  . Leg swelling 11/03/2018  . Other fatigue 06/30/2018  . Shortness of breath on exertion 06/30/2018  . Hyperglycemia 06/30/2018  . Vitamin D deficiency 06/30/2018  . Chronic anticoagulation 01/03/2018  . Constipation 11/03/2017  . Atrial flutter ([place]) 03/11/2017  . CAD (coronary artery disease) 01/28/2017  . Obesity (BMI 30-39.9) 01/13/2017  . Ascending aorta dilatation (HCC) 01/07/2017  . Low back pain radiating to right lower extremity 12/02/2016  . Acquired cyst of kidney 12/02/2016  . Chronic kidney disease (CKD), stage III (moderate) 12/01/2016  . Generalized anxiety disorder 10/22/2016  . Hypersomnia 10/01/2016  .  Accessory skin tags 06/10/2016  . Nocturnal hypoxemia 05/13/2016  . Cervical radiculopathy 04/14/2016  . GERD (gastroesophageal reflux disease) 01/02/2016  . Vitamin B12 deficiency 10/02/2015  . Moderate persistent asthma with exacerbation 07/09/2015  . HIV disease ([place]) 07/09/2015  . Uncontrolled  hypertension 07/09/2015   PCP:  [name], MD Pharmacy:   [facility] [place], [place] [address] Phone: [phone] Fax: [phone]     Social Determinants of Health (SDOH) Interventions    Readmission Risk Interventions No flowsheet data found.

## 2020-02-14 NOTE — Progress Notes (Signed)
NAME:  Shannon [name], MRN:  [id number], DOB:  1955-09-19, LOS: 0 ADMISSION DATE:  02/13/2020, CHIEF COMPLAINT:  Pulmonary Nodule   Brief History     History of present illness   64 yo obese never smoker w HIV, Severe persistent asthma, A fib / flutter on xarelto. She underwent ENB to evaluate LUL pulmonary nodule on 6/22. She was slow to recover from anesthesia, but was able to be extubated in the PACU. She is admitted for observation to insure stability post-procedure.   Past Medical History   Past Medical History:  Diagnosis Date  . Anemia   . Anxiety    HX PANIC ATTACKS  . Arthritis    "starting to; in my hands" (07/09/2015)  . Asthma   . Atrial fibrillation ([place])   . Atrial flutter, paroxysmal ([place])   . Bloated abdomen   . CFS (chronic fatigue syndrome)   . Chewing difficulty   . Chronic asthma with acute exacerbation    "I have chronic asthma all the time; sometimes exacerbations" (07/09/2015)  . Chronic lower back pain   . COPD (chronic obstructive pulmonary disease) ([place])   . Cyst of right kidney    "3 of them; dx'd in ~ 01/2015"  . Dyspnea   . GERD (gastroesophageal reflux disease)   . Heart murmur   . History of blood transfusion    "related to my brain surgery I think"  . History of pulmonary embolism 07/09/2015  . HIV antibody positive ([place])   . HIV disease ([place])   . Hyperlipidemia   . Hypertension   . Leg edema   . Lipodystrophy   . Mild CAD 2013  . Multiple thyroid nodules   . Osteopenia   . Palpitations   . Pneumonia 07/09/2015  . Shingles   . Sleep apnea    "never completed part 2 of study; never wore mask" (07/09/2015)  . Vitamin B 12 deficiency   . Vitamin D deficiency      Significant Hospital Events     Consults:    Procedures:  ENB 6/22, LUL nodule biopsies  Significant Diagnostic Tests:    Micro Data:    Antimicrobials:     Interim history/subjective:  States she does not feel well.  She is very lethargic the time of  examination.  Plan to go to work motivated mobilized and home in the a.m. on 02/15/2020  Objective   Blood pressure 132/63, pulse 86, temperature 98.6 F (37 C), resp. rate 16, weight 105.1 kg, SpO2 100 %.    Vent Mode: PSV;CPAP FiO2 (%):  [40 %] 40 % Set Rate:  [430 bmp] 430 bmp PEEP:  [5 cmH20] 5 cmH20 Pressure Support:  [8 cmH20] 8 cmH20   Intake/Output Summary (Last 24 hours) at 02/14/2020 1105 Last data filed at 02/14/2020 0745 Gross per 24 hour  Intake 240 ml  Output --  Net 240 ml   Filed Weights   02/13/20 2049  Weight: 105.1 kg    Examination: General elderly obese female who is somewhat lethargic this a.m. she does pill chronically ill Noted to have hoarseness and wheeze that is consistent with vocal cords rather than pulmonary Chest with decreased air movement throughout currently on 1.5 L of oxygen with O2 sats of 94% Chest heart sounds are distant Abdomen soft obese Extremities without edema  Resolved Hospital Problem list     Assessment & Plan:   LUL Nodule, s/p general anesthesia for ENB.  No sedating medications Has diagnosed sleep  apnea but does not wear CPAP O2 to keep O2 saturations greater than 88% Mobilize Case manager has been involved in her care Discharge home 02/15/2020 or to skilled nursing facility.  Asthma Continue home Breo and albuterol as needed   A Fib / flutter Xarelto currently on hold no evidence of bleeding can be reinstated next 24 hours  HTN Continue home clonidine and diltiazem Holding Lasix and Aldactone will restart at time of discharge    HIV Continue home Biktarvy     Labs   CBC: No results for input(s): WBC, NEUTROABS, HGB, HCT, MCV, PLT in the last 168 hours.  Basic Metabolic Panel: No results for input(s): NA, K, CL, CO2, GLUCOSE, BUN, CREATININE, CALCIUM, MG, PHOS in the last 168 hours. GFR: Estimated Creatinine Clearance: 40.1 mL/min (A) (by C-G formula based on SCr of 1.64 mg/dL (H)). No results for  input(s): PROCALCITON, WBC, LATICACIDVEN in the last 168 hours.  Liver Function Tests: No results for input(s): AST, ALT, ALKPHOS, BILITOT, PROT, ALBUMIN in the last 168 hours. No results for input(s): LIPASE, AMYLASE in the last 168 hours. No results for input(s): AMMONIA in the last 168 hours.  ABG    Component Value Date/Time   PHART 7.423 09/18/2016 1722   PCO2ART 43.2 09/18/2016 1722   PO2ART 63.0 (L) 09/18/2016 1722   HCO3 28.2 (H) 09/18/2016 1722   TCO2 30 09/18/2016 1722   O2SAT 92.0 09/18/2016 1722     Coagulation Profile: Recent Labs  Lab 02/13/20 1242  INR 1.0    Cardiac Enzymes: No results for input(s): CKTOTAL, CKMB, CKMBINDEX, TROPONINI in the last 168 hours.  HbA1C: Hgb A1c MFr Bld  Date/Time Value Ref Range Status  09/07/2019 02:41 PM 5.7 (H) <5.7 % of total Hgb Final    Comment:    For someone without known diabetes, a hemoglobin  A1c value between 5.7% and 6.4% is consistent with prediabetes and should be confirmed with a  follow-up test. . For someone with known diabetes, a value <7% indicates that their diabetes is well controlled. A1c targets should be individualized based on duration of diabetes, age, comorbid conditions, and other considerations. . This assay result is consistent with an increased risk of diabetes. . Currently, no consensus exists regarding use of hemoglobin A1c for diagnosis of diabetes for children. .   06/30/2018 10:16 AM 5.6 4.8 - 5.6 % Final    Comment:             Prediabetes: 5.7 - 6.4          Diabetes: >6.4          Glycemic control for adults with diabetes: <7.0     CBG: No results for input(s): GLUCAP in the last 168 hours.    Richardson Landry Corbitt Cloke ACNP Acute Care Nurse Practitioner [place] Please consult Amion 02/14/2020, 11:06 AM

## 2020-02-15 ENCOUNTER — Encounter: Payer: Self-pay | Admitting: *Deleted

## 2020-02-15 ENCOUNTER — Telehealth: Payer: Self-pay | Admitting: Internal Medicine

## 2020-02-15 ENCOUNTER — Encounter (HOSPITAL_COMMUNITY): Payer: Self-pay | Admitting: Emergency Medicine

## 2020-02-15 ENCOUNTER — Inpatient Hospital Stay (HOSPITAL_COMMUNITY): Payer: Medicare Other

## 2020-02-15 DIAGNOSIS — R911 Solitary pulmonary nodule: Secondary | ICD-10-CM

## 2020-02-15 DIAGNOSIS — C3492 Malignant neoplasm of unspecified part of left bronchus or lung: Secondary | ICD-10-CM

## 2020-02-15 LAB — BASIC METABOLIC PANEL
Anion gap: 7 (ref 5–15)
BUN: 12 mg/dL (ref 8–23)
CO2: 29 mmol/L (ref 22–32)
Calcium: 9.5 mg/dL (ref 8.9–10.3)
Chloride: 102 mmol/L (ref 98–111)
Creatinine, Ser: 1.38 mg/dL — ABNORMAL HIGH (ref 0.44–1.00)
GFR calc Af Amer: 46 mL/min — ABNORMAL LOW (ref >60)
GFR calc non Af Amer: 40 mL/min — ABNORMAL LOW (ref >60)
Glucose, Bld: 135 mg/dL — ABNORMAL HIGH (ref 70–99)
Potassium: 4.1 mmol/L (ref 3.5–5.1)
Sodium: 138 mmol/L (ref 135–145)

## 2020-02-15 LAB — MAGNESIUM: Magnesium: 1.6 mg/dL — ABNORMAL LOW (ref 1.7–2.4)

## 2020-02-15 LAB — BRAIN NATRIURETIC PEPTIDE: B Natriuretic Peptide: 73.6 pg/mL (ref 0.0–100.0)

## 2020-02-15 LAB — PHOSPHORUS: Phosphorus: 2.1 mg/dL — ABNORMAL LOW (ref 2.5–4.6)

## 2020-02-15 MED ORDER — BUDESONIDE 0.5 MG/2ML IN SUSP
0.5000 mg | Freq: Two times a day (BID) | RESPIRATORY_TRACT | Status: DC
  Administered 2020-02-15 (×2): 0.5 mg via RESPIRATORY_TRACT
  Filled 2020-02-15 (×3): qty 2

## 2020-02-15 MED ORDER — CARVEDILOL 6.25 MG PO TABS
6.2500 mg | ORAL_TABLET | Freq: Two times a day (BID) | ORAL | Status: DC
  Administered 2020-02-15: 6.25 mg via ORAL
  Filled 2020-02-15 (×2): qty 1

## 2020-02-15 MED ORDER — SODIUM CHLORIDE 0.9 % IV SOLN
500.0000 mg | INTRAVENOUS | Status: DC
  Administered 2020-02-15: 500 mg via INTRAVENOUS
  Filled 2020-02-15 (×2): qty 500

## 2020-02-15 MED ORDER — RIVAROXABAN 20 MG PO TABS
20.0000 mg | ORAL_TABLET | Freq: Every day | ORAL | Status: DC
  Administered 2020-02-15: 20 mg via ORAL
  Filled 2020-02-15: qty 1

## 2020-02-15 MED ORDER — ARFORMOTEROL TARTRATE 15 MCG/2ML IN NEBU
15.0000 ug | INHALATION_SOLUTION | Freq: Two times a day (BID) | RESPIRATORY_TRACT | Status: DC
  Administered 2020-02-15 (×2): 15 ug via RESPIRATORY_TRACT
  Filled 2020-02-15 (×3): qty 2

## 2020-02-15 MED ORDER — MAGNESIUM SULFATE 4 GM/100ML IV SOLN
4.0000 g | Freq: Once | INTRAVENOUS | Status: AC
  Administered 2020-02-15: 4 g via INTRAVENOUS
  Filled 2020-02-15: qty 100

## 2020-02-15 MED ORDER — FUROSEMIDE 20 MG PO TABS
20.0000 mg | ORAL_TABLET | ORAL | Status: DC
  Administered 2020-02-15: 20 mg via ORAL
  Filled 2020-02-15 (×2): qty 1

## 2020-02-15 MED ORDER — METHYLPREDNISOLONE SODIUM SUCC 125 MG IJ SOLR
60.0000 mg | Freq: Two times a day (BID) | INTRAMUSCULAR | Status: DC
  Administered 2020-02-15 (×2): 60 mg via INTRAVENOUS
  Filled 2020-02-15 (×2): qty 2

## 2020-02-15 NOTE — Telephone Encounter (Signed)
ATC pt, no answer. Left message for pt to call back.  

## 2020-02-15 NOTE — Telephone Encounter (Signed)
Patient is returning phone call. Patient phone number is [phone].

## 2020-02-15 NOTE — Progress Notes (Signed)
Per Dr. Tamala Julian, he would like patient to be referred to [facility].  I will completed referral and update their staff.

## 2020-02-15 NOTE — TOC Progression Note (Signed)
Transition of Care [facility]) - Progression Note    Patient Details  Name: Shannon [name] MRN: [id number] Date of Birth: [birth year]  Transition of Care [facility]) CM/SW [place], RN Phone Number: [phone]  02/15/2020, 2:10 PM  Clinical Narrative:    CM consulted for possible SNF placement. Cm at bedside and patient refuses SNF. Patient states she is feeling better and needs one more day before she discharges home where she has 2 neighbors waiting to assist her with her care. Patient denies that she has any friends or family that can assist with her care. Message has been sent to MD to make him aware. Bedside nurse updated. CM will continue to follow.   Expected Discharge Plan: Home/Self Care Barriers to Discharge: Continued Medical Work up  Expected Discharge Plan and Services Expected Discharge Plan: Home/Self Care In-house Referral: NA Discharge Planning Services: CM Consult Post Acute Care Choice: NA Living arrangements for the past 2 months: Single Family Home Expected Discharge Date: 02/13/20               DME Arranged: Bedside commode, Other see comment (bedside table) DME Agency: [employer] Date DME Agency Contacted: 02/14/20 Time DME Agency Contacted: [phone] Representative spoke with at DME Agency: [place]: NA [place] Agency: NA         Social Determinants of Health ([place]) Interventions    Readmission Risk Interventions No flowsheet data found.

## 2020-02-15 NOTE — Telephone Encounter (Signed)
Pt returning a phone call. Pt can be reached [phone].

## 2020-02-15 NOTE — Progress Notes (Signed)
  Request for Thyroid FNA seen.  These are not performed as inpatient.  We have an outpatient order in Epic and will arrange outpatient procedure. Will call patient with appointment details.  She does not need to hold anticoagulation for FNA.  I will remove the inpatient order.  Ordering provider aware.  Karryn Kosinski S Shonita Rinck PA-C 02/15/2020 8:31 AM

## 2020-02-15 NOTE — Evaluation (Signed)
Physical Therapy Evaluation Patient Details Name: Shannon [name] MRN: [id number] DOB: 03/13/1956 Today's Date: 02/15/2020   History of Present Illness  Pt is a 64 y/o female who is s/p bronchoscopy of L upper lobe lung nodule. Pt with difficulty recovering from anesthesia and was admitted overnight. PMH includes HTN, COPD, HIV, and a fib.   Clinical Impression  Pt admitted secondary to problem above with deficits below. Pt with poor tolerance for mobility and only able to tolerate short distance within the room secondary to increased SOB. Oxygen sats at 91% on 2L of oxygen. Pt currently lives alone and only has a neighbor that can check on her intermittently. Feel she will have increased difficulty performing mobility tasks and ADLs independently at d/c, so recommending SNF for continued therapies prior to return home. Pt may refuse, and if pt refuses, will require max HH services. Will continue to follow acutely to maximize functional independence and safety.     Follow Up Recommendations SNF (will need max HH if refuses SNF )    Equipment Recommendations  3in1 (PT);Hospital bed    Recommendations for Other Services       Precautions / Restrictions Precautions Precautions: Fall Restrictions Weight Bearing Restrictions: No      Mobility  Bed Mobility               General bed mobility comments: Sitting EOB upon entry.   Transfers Overall transfer level: Needs assistance Equipment used: Rolling walker (2 wheeled) Transfers: Sit to/from Stand Sit to Stand: Min guard         General transfer comment: Min guard for steadying. Pt reports lightheadedness, but reports she has been lightheaded.   Ambulation/Gait Ambulation/Gait assistance: Min guard Gait Distance (Feet): 15 Feet Assistive device: Rolling walker (2 wheeled) Gait Pattern/deviations: Step-through pattern;Decreased stride length Gait velocity: Decreased   General Gait Details: Slow, very guarded gait. Pt  becoming very winded with short distance ambulation and required seated rest. Min guard for steadying assist. Cues for safe use of RW. Oxygen sats at 91% on 2L of oxygen   Stairs            Wheelchair Mobility    Modified Rankin (Stroke Patients Only)       Balance Overall balance assessment: Needs assistance Sitting-balance support: No upper extremity supported;Feet supported Sitting balance-Leahy Scale: Fair     Standing balance support: Bilateral upper extremity supported;During functional activity Standing balance-Leahy Scale: Poor Standing balance comment: Reliant on BUE support                              Pertinent Vitals/Pain Pain Assessment: 0-10 Pain Score: 8  Pain Location: headache Pain Descriptors / Indicators: Headache Pain Intervention(s): Limited activity within patient's tolerance;Monitored during session;Repositioned    Home Living Family/patient expects to be discharged to:: Private residence Living Arrangements: Alone Available Help at Discharge: Neighbor Type of Home: Other(Comment) (townhouse) Home Access: Level entry     Home Layout: Two level Home Equipment: Environmental consultant - 2 wheels;Cane - single point Additional Comments: on 1.5 L oxygen at night     Prior Function Level of Independence: Independent with assistive device(s)         Comments: Was using cane for ambulation      Hand Dominance        Extremity/Trunk Assessment   Upper Extremity Assessment Upper Extremity Assessment: Generalized weakness    Lower Extremity Assessment Lower Extremity Assessment: Generalized  weakness    Cervical / Trunk Assessment Cervical / Trunk Assessment: Normal  Communication   Communication: No difficulties  Cognition Arousal/Alertness: Awake/alert Behavior During Therapy: WFL for tasks assessed/performed Overall Cognitive Status: No family/caregiver present to determine baseline cognitive functioning                                         General Comments General comments (skin integrity, edema, etc.): Educated about SNF recommendations, but pt reports she wants to stay in the hospital until ready for d/c. Notified RN and MD.     Exercises     Assessment/Plan    PT Assessment Patient needs continued PT services  PT Problem List Decreased strength;Decreased activity tolerance;Decreased balance;Decreased mobility;Decreased safety awareness;Decreased knowledge of precautions;Cardiopulmonary status limiting activity       PT Treatment Interventions Gait training;DME instruction;Stair training;Functional mobility training;Therapeutic activities;Therapeutic exercise;Balance training;Patient/family education    PT Goals (Current goals can be found in the Care Plan section)  Acute Rehab PT Goals Patient Stated Goal: to feel better and then go home PT Goal Formulation: With patient Time For Goal Achievement: 02/29/20 Potential to Achieve Goals: Good    Frequency Min 3X/week   Barriers to discharge        Co-evaluation               AM-PAC PT "6 Clicks" Mobility  Outcome Measure Help needed turning from your back to your side while in a flat bed without using bedrails?: A Little Help needed moving from lying on your back to sitting on the side of a flat bed without using bedrails?: A Little Help needed moving to and from a bed to a chair (including a wheelchair)?: A Little Help needed standing up from a chair using your arms (e.g., wheelchair or bedside chair)?: A Little Help needed to walk in hospital room?: A Little Help needed climbing 3-5 steps with a railing? : A Lot 6 Click Score: 17    End of Session Equipment Utilized During Treatment: Gait belt;Oxygen Activity Tolerance: Patient limited by fatigue Patient left: in bed;with call bell/phone within reach;with bed alarm set (sitting EOB ) Nurse Communication: Mobility status PT Visit Diagnosis: Other abnormalities of gait and  mobility (R26.89);Unsteadiness on feet (R26.81);Muscle weakness (generalized) (M62.81)    Time: 1050-1110 PT Time Calculation (min) (ACUTE ONLY): 20 min   Charges:   PT Evaluation $PT Eval Moderate Complexity: 1 Mod          Reuel Derby, PT, DPT  Acute Rehabilitation Services  Pager: [phone] Office: [phone]   Rudean Hitt 02/15/2020, 11:25 AM

## 2020-02-15 NOTE — Progress Notes (Signed)
NAME:  Shannon [name], MRN:  [id number], DOB:  November 26, 1955, LOS: 1 ADMISSION DATE:  02/13/2020, CHIEF COMPLAINT:  Pulmonary Nodule   Brief History     History of present illness   64 yo obese never smoker w HIV, Severe persistent asthma, A fib / flutter on xarelto. She underwent ENB to evaluate LUL pulmonary nodule on 6/22. She was slow to recover from anesthesia, but was able to be extubated in the PACU. She is admitted for observation to insure stability post-procedure.   Past Medical History   Past Medical History:  Diagnosis Date  . Anemia   . Anxiety    HX PANIC ATTACKS  . Arthritis    "starting to; in my hands" (07/09/2015)  . Asthma   . Atrial fibrillation ([place])   . Atrial flutter, paroxysmal ([place])   . Bloated abdomen   . CFS (chronic fatigue syndrome)   . Chewing difficulty   . Chronic asthma with acute exacerbation    "I have chronic asthma all the time; sometimes exacerbations" (07/09/2015)  . Chronic lower back pain   . COPD (chronic obstructive pulmonary disease) ([place])   . Cyst of right kidney    "3 of them; dx'd in ~ 01/2015"  . Dyspnea   . GERD (gastroesophageal reflux disease)   . Heart murmur   . History of blood transfusion    "related to my brain surgery I think"  . History of pulmonary embolism 07/09/2015  . HIV antibody positive ([place])   . HIV disease ([place])   . Hyperlipidemia   . Hypertension   . Leg edema   . Lipodystrophy   . Mild CAD 2013  . Multiple thyroid nodules   . Osteopenia   . Palpitations   . Pneumonia 07/09/2015  . Shingles   . Sleep apnea    "never completed part 2 of study; never wore mask" (07/09/2015)  . Vitamin B 12 deficiency   . Vitamin D deficiency      Significant Hospital Events     Consults:    Procedures:  ENB 6/22, LUL nodule biopsies  Significant Diagnostic Tests:    Micro Data:    Antimicrobials:     Interim history/subjective:  Still feels pretty lousy with continuing bronchospasm, coughing  spells. Weak, having trouble getting around.  Objective   Blood pressure (!) 179/96, pulse 92, temperature 99 F (37.2 C), resp. rate 18, weight 105.1 kg, SpO2 94 %.        Intake/Output Summary (Last 24 hours) at 02/15/2020 0949 Last data filed at 02/14/2020 1500 Gross per 24 hour  Intake 504 ml  Output --  Net 504 ml   Filed Weights   02/13/20 2049  Weight: 105.1 kg    Examination: GEN: chronically ill woman lying in bed HEENT: MMM, malampatti 4 CV: RRR, ext warm PULM: Wheezing worse on left, occasional accessory muscle use GI: Soft, +BS EXT: No edema NEURO: Moves all 4 ext PSYCH: RASS 0  SKIN: No rashes   Resolved Hospital Problem list     Assessment & Plan:  Acute asthma exacerbation- still not really turning corner symptomatically, switch to IV steroids, give IV mag, switch breo to pulmicort/brovana, flutter valve, up to chair  HIV- continue home meds  HTN- on PTA meds now, still a bit high, adding coreg  Lung adenocarcinoma- discussed diagnosis with patient, lung function and comorbid conditions likely limit her to chemo/XRT but defer to outpatient oncology - Outpatient orders for medonc, radonc, message sent to  oncology navigator  Muscular deconditioning- acute on chronic, PT consult to see if she would benefit from home services  Thyroid nodule, goiter- OP FNA, xarelto does not need to be help per IR   Hx Afib: fine to resume xarelto  Overall may need more time to settle out post bronchoscopy; due to [facility] staffing shortage, will ask if [facility] willing to take over as primary, appreciate help  Erskine Emery MD

## 2020-02-15 NOTE — Telephone Encounter (Signed)
I sure can. I will complete referral and update their staff.

## 2020-02-15 NOTE — Telephone Encounter (Signed)
Scheduled appt per 6/24 sch message- pt is aware of appt

## 2020-02-15 NOTE — Telephone Encounter (Signed)
She has seen Julien Nordmann already, needs follow-up New appointment with radiation oncology, lung function too poor for resection

## 2020-02-15 NOTE — Progress Notes (Signed)
I contacted scheduling to have them call and schedule Shannon [name] to see Dr. Julien Nordmann on 02/20/20. I have notified Rad Onc to reach out to her and schedule her with them as well.

## 2020-02-15 NOTE — Progress Notes (Signed)
Within 10 minutes starting azithromycin pt began to feel extremely nauseas and had dry heaving. RN paused antibiotic and paged [place] MD. RN also let pharmacy know. Pharmacy stated they would try to follow up and have medication changed. RN will continue to monitor pt.

## 2020-02-16 DIAGNOSIS — I480 Paroxysmal atrial fibrillation: Secondary | ICD-10-CM

## 2020-02-16 DIAGNOSIS — I10 Essential (primary) hypertension: Secondary | ICD-10-CM

## 2020-02-16 DIAGNOSIS — E041 Nontoxic single thyroid nodule: Secondary | ICD-10-CM

## 2020-02-16 DIAGNOSIS — N189 Chronic kidney disease, unspecified: Secondary | ICD-10-CM

## 2020-02-16 DIAGNOSIS — N179 Acute kidney failure, unspecified: Secondary | ICD-10-CM

## 2020-02-16 DIAGNOSIS — C801 Malignant (primary) neoplasm, unspecified: Secondary | ICD-10-CM

## 2020-02-16 DIAGNOSIS — R5381 Other malaise: Secondary | ICD-10-CM

## 2020-02-16 LAB — CBC
HCT: 40.9 % (ref 36.0–46.0)
Hemoglobin: 13.2 g/dL (ref 12.0–15.0)
MCH: 31 pg (ref 26.0–34.0)
MCHC: 32.3 g/dL (ref 30.0–36.0)
MCV: 96 fL (ref 80.0–100.0)
Platelets: 206 10*3/uL (ref 150–400)
RBC: 4.26 MIL/uL (ref 3.87–5.11)
RDW: 12.1 % (ref 11.5–15.5)
WBC: 7.8 10*3/uL (ref 4.0–10.5)
nRBC: 0 % (ref 0.0–0.2)

## 2020-02-16 LAB — BASIC METABOLIC PANEL
Anion gap: 9 (ref 5–15)
BUN: 20 mg/dL (ref 8–23)
CO2: 28 mmol/L (ref 22–32)
Calcium: 10.2 mg/dL (ref 8.9–10.3)
Chloride: 97 mmol/L — ABNORMAL LOW (ref 98–111)
Creatinine, Ser: 1.3 mg/dL — ABNORMAL HIGH (ref 0.44–1.00)
GFR calc Af Amer: 50 mL/min — ABNORMAL LOW (ref >60)
GFR calc non Af Amer: 43 mL/min — ABNORMAL LOW (ref >60)
Glucose, Bld: 205 mg/dL — ABNORMAL HIGH (ref 70–99)
Potassium: 4.4 mmol/L (ref 3.5–5.1)
Sodium: 134 mmol/L — ABNORMAL LOW (ref 135–145)

## 2020-02-16 LAB — PHOSPHORUS: Phosphorus: 2.6 mg/dL (ref 2.5–4.6)

## 2020-02-16 LAB — MAGNESIUM: Magnesium: 1.8 mg/dL (ref 1.7–2.4)

## 2020-02-16 MED ORDER — POLYETHYLENE GLYCOL 3350 17 G PO PACK
17.0000 g | PACK | Freq: Two times a day (BID) | ORAL | Status: DC | PRN
  Administered 2020-02-16: 17 g via ORAL
  Filled 2020-02-16: qty 1

## 2020-02-16 MED ORDER — FLUTICASONE FUROATE-VILANTEROL 200-25 MCG/INH IN AEPB
1.0000 | INHALATION_SPRAY | Freq: Every day | RESPIRATORY_TRACT | Status: DC
  Administered 2020-02-16 – 2020-02-17 (×2): 1 via RESPIRATORY_TRACT
  Filled 2020-02-16: qty 28

## 2020-02-16 MED ORDER — MELATONIN 5 MG PO TABS
5.0000 mg | ORAL_TABLET | Freq: Every day | ORAL | Status: DC
  Administered 2020-02-16: 5 mg via ORAL
  Filled 2020-02-16: qty 1

## 2020-02-16 MED ORDER — HYDRALAZINE HCL 50 MG PO TABS
50.0000 mg | ORAL_TABLET | Freq: Three times a day (TID) | ORAL | Status: DC
  Administered 2020-02-16 – 2020-02-17 (×3): 50 mg via ORAL
  Filled 2020-02-16 (×4): qty 1

## 2020-02-16 MED ORDER — SENNOSIDES-DOCUSATE SODIUM 8.6-50 MG PO TABS
1.0000 | ORAL_TABLET | Freq: Two times a day (BID) | ORAL | Status: DC | PRN
  Administered 2020-02-16 – 2020-02-17 (×3): 1 via ORAL
  Filled 2020-02-16 (×4): qty 1

## 2020-02-16 MED ORDER — AZITHROMYCIN 250 MG PO TABS
500.0000 mg | ORAL_TABLET | Freq: Every day | ORAL | Status: DC
  Administered 2020-02-16 – 2020-02-17 (×2): 500 mg via ORAL
  Filled 2020-02-16 (×2): qty 2

## 2020-02-16 MED ORDER — PREDNISONE 20 MG PO TABS
40.0000 mg | ORAL_TABLET | Freq: Every day | ORAL | Status: DC
  Administered 2020-02-17: 40 mg via ORAL
  Filled 2020-02-16: qty 2

## 2020-02-16 NOTE — Progress Notes (Signed)
Physical Therapy Treatment Patient Details Name: Shannon [name] MRN: [id number] DOB: Nov 18, 1955 Today's Date: 02/16/2020    History of Present Illness Pt is a 64 y/o female who is s/p bronchoscopy of L upper lobe lung nodule. Pt with difficulty recovering from anesthesia and was admitted overnight. PMH includes HTN, COPD, HIV, and a fib.     PT Comments    Pt with progressed ambulation distance and did not require AD this day, but does reach for environment to steady self. Pt with significant dyspnea on exertion, requiring PT cues for rest breaks and proper breathing technique. Pt required 2LO2 to maintain O2 sats, as pt desatted to 85% on RA, RN notified. PT educated pt on the importance of rest breaks during mobility, proper breathing technique to recover dyspnea, and asking for assist from neighbors for ADLs and mobility. Pt agreeable to this, and HHPT. PT to continue to progress mobility as able.  SATURATION QUALIFICATIONS: (This note is used to comply with regulatory documentation for home oxygen)  Patient Saturations on Room Air at Rest = 93%  Patient Saturations on Room Air while Ambulating = 85%  Patient Saturations on 2 Liters of oxygen while Ambulating = 90%  Please briefly explain why patient needs home oxygen: to maintain O2 sats at appropriate level during mobility    Follow Up Recommendations  SNF (refusing, recommend HHPT)     Equipment Recommendations  3in1 (PT);Hospital bed    Recommendations for Other Services       Precautions / Restrictions Precautions Precautions: Fall Restrictions Weight Bearing Restrictions: No    Mobility  Bed Mobility Overal bed mobility: Needs Assistance             General bed mobility comments: Sitting EOB upon entry.   Transfers Overall transfer level: Needs assistance Equipment used: Rolling walker (2 wheeled) Transfers: Sit to/from Stand Sit to Stand: Min guard         General transfer comment: for safety, pt  with increased time to rise but no physical assist provided.  Ambulation/Gait Ambulation/Gait assistance: Min guard Gait Distance (Feet): 50 Feet (x2 - standing rest break to recover dyspnea) Assistive device: None Gait Pattern/deviations: Step-through pattern;Decreased stride length;Drifts right/left;Wide base of support Gait velocity: decr   General Gait Details: Min guard for safety, pt intermittently reaching for environment to steady self but declines use of RW. DOE 3/4, verbal cuing for breathing technique in through nose out through mouth. SpO2 85% on RA, requires 2Lo2 to recover sats >88%.   Stairs Stairs:  (pt declines need to practice steps, says her neighbors will help her and she plans to have bed on first floor upon d/c home)           Wheelchair Mobility    Modified Rankin (Stroke Patients Only)       Balance Overall balance assessment: Needs assistance Sitting-balance support: No upper extremity supported;Feet supported Sitting balance-Leahy Scale: Fair     Standing balance support: During functional activity Standing balance-Leahy Scale: Fair Standing balance comment: reaches for environment intermittently to steady                            Cognition Arousal/Alertness: Awake/alert Behavior During Therapy: WFL for tasks assessed/performed Overall Cognitive Status: No family/caregiver present to determine baseline cognitive functioning  General Comments: lacks insight into mobility deficits and safety awarenesss, feels she is safe to d/c home without assist 24/7. Pt reporting mold in her home but will fix this with air purifier only      Exercises      General Comments        Pertinent Vitals/Pain Pain Assessment: Faces Faces Pain Scale: Hurts little more Pain Location: head, "my sinusitis" Pain Descriptors / Indicators: Headache Pain Intervention(s): Limited activity within patient's  tolerance;Monitored during session    Home Living                      Prior Function            PT Goals (current goals can now be found in the care plan section) Acute Rehab PT Goals Patient Stated Goal: to feel better and then go home PT Goal Formulation: With patient Time For Goal Achievement: 02/29/20 Potential to Achieve Goals: Good Progress towards PT goals: Progressing toward goals    Frequency    Min 3X/week      PT Plan Current plan remains appropriate    Co-evaluation              AM-PAC PT "6 Clicks" Mobility   Outcome Measure  Help needed turning from your back to your side while in a flat bed without using bedrails?: A Little Help needed moving from lying on your back to sitting on the side of a flat bed without using bedrails?: A Little Help needed moving to and from a bed to a chair (including a wheelchair)?: A Little Help needed standing up from a chair using your arms (e.g., wheelchair or bedside chair)?: A Little Help needed to walk in hospital room?: A Little Help needed climbing 3-5 steps with a railing? : A Little 6 Click Score: 18    End of Session Equipment Utilized During Treatment: Gait belt;Oxygen Activity Tolerance: Patient limited by fatigue Patient left: in bed;with call bell/phone within reach (sitting EOB ) Nurse Communication: Mobility status PT Visit Diagnosis: Other abnormalities of gait and mobility (R26.89);Unsteadiness on feet (R26.81);Muscle weakness (generalized) (M62.81)     Time: 5631-4970 PT Time Calculation (min) (ACUTE ONLY): 23 min  Charges:  $Gait Training: 8-22 mins $Therapeutic Activity: 8-22 mins                    Shannon [name] E, PT Acute Rehabilitation Services Pager [phone]  Office [phone]    Franca Stakes D Matheau [place] 02/16/2020, 10:27 AM

## 2020-02-16 NOTE — Progress Notes (Signed)
NAME:  Shannon [name], MRN:  [id number], DOB:  Oct 16, 1955, LOS: 2 ADMISSION DATE:  02/13/2020, CHIEF COMPLAINT:  Pulmonary Nodule   Brief History     History of present illness   64 yo obese never smoker w HIV, Severe persistent asthma, A fib / flutter on xarelto. She underwent ENB to evaluate LUL pulmonary nodule on 6/22. She was slow to recover from anesthesia, but was able to be extubated in the PACU. She is admitted for observation to insure stability post-procedure.   Past Medical History   Past Medical History:  Diagnosis Date  . Anemia   . Anxiety    HX PANIC ATTACKS  . Arthritis    "starting to; in my hands" (07/09/2015)  . Asthma   . Atrial fibrillation ([place])   . Atrial flutter, paroxysmal ([place])   . Bloated abdomen   . CFS (chronic fatigue syndrome)   . Chewing difficulty   . Chronic asthma with acute exacerbation    "I have chronic asthma all the time; sometimes exacerbations" (07/09/2015)  . Chronic lower back pain   . COPD (chronic obstructive pulmonary disease) ([place] )   . Cyst of right kidney    "3 of them; dx'd in ~ 01/2015"  . Dyspnea   . GERD (gastroesophageal reflux disease)   . Heart murmur   . History of blood transfusion    "related to my brain surgery I think"  . History of pulmonary embolism 07/09/2015  . HIV antibody positive ([place])   . HIV disease ([place])   . Hyperlipidemia   . Hypertension   . Leg edema   . Lipodystrophy   . Mild CAD 2013  . Multiple thyroid nodules   . Osteopenia   . Palpitations   . Pneumonia 07/09/2015  . Shingles   . Sleep apnea    "never completed part 2 of study; never wore mask" (07/09/2015)  . Vitamin B 12 deficiency   . Vitamin D deficiency      Significant Hospital Events     Consults:    Procedures:  ENB 6/22, LUL nodule biopsies with adenocarcinoma  Significant Diagnostic Tests:    Micro Data:    Antimicrobials:  Zithromax 02/15/2020  Interim history/subjective:  Still feels pretty lousy with  continuing bronchospasm, coughing spells. Weak, having trouble getting around.  Objective   Blood pressure (!) 174/92, pulse 76, temperature 97.9 F (36.6 C), resp. rate (!) 22, weight 105.1 kg, SpO2 96 %.        Intake/Output Summary (Last 24 hours) at 02/16/2020 7989 Last data filed at 02/16/2020 0300 Gross per 24 hour  Intake 0 ml  Output --  Net 0 ml   Filed Weights   02/13/20 2049  Weight: 105.1 kg    Examination: Ill-appearing female who reports not breathing well although she has her oxygen off currently. Decreased air movement some expiratory wheezes noted Heart sounds regular regular rate and rhythm Abdomen is obese nondistended positive bowel sounds Extremities warm with edema  Resolved Hospital Problem list     Assessment & Plan:  Status post fiberoptic bronchoscopy with some residual hemoptysis now back on anticoagulants for history of atrial fibrillation. Adenocarcinoma on biopsy Continue to monitor hemoptysis Follow-up with oncology Dr. Earlie Server most likely radiation only  Asthma exacerbation Wean steroids Continue bronchodilators Continue oxygen as needed We will add humidity to oxygen to decrease drying effect of oxygen   HIV Continue home medications  History of atrial fibrillation/hypertension  she is now back on her  Xarelto Continue Cardizem Continue Apresoline Continue Catapres  Pulmonary critical care will be available as needed we will see again on Monday, 02/19/2020  Richardson Landry Maguire [place] ACNP Acute Care Nurse Practitioner [place] Please consult [place] 02/16/2020, 9:03 AM

## 2020-02-16 NOTE — Progress Notes (Signed)
PROGRESS NOTE  Shannon [name] EXB:[id number] DOB: 1956/04/17   PCP: [name], MD  Patient is from: Home.  DOA: 02/13/2020 LOS: 2  Brief Narrative / Interim history: 64 year old female with history of HIV on HART, severe persistent asthma, A. fib/flutter on Xarelto, LUL nodule /mass and morbid obesity.  She underwent ENB for evaluation of LUL pulmonary nodule on 02/13/2020, had slow recovery from anesthesia.  She was admitted to ICU for observation to ensure stability but found to be in asthma exacerbation.  She was started on steroid, antibiotics and breathing treatments.  Eventually improved and transferred to [facility] hospitalist service on 02/16/2020.   Procedure revealed adenocarcinoma.  A follow-up with medical and radiation oncology set up.   Evaluated by therapy who recommended SNF. However, patient would like to go home with home health and DME when medically ready.  Subjective: Seen and examined earlier this morning.  She pulled her IV out last night due to pain.  She reports some hemoptysis with coughing but breathing better.  Also feels stronger.  She denies chest pain.  Objective: Vitals:   02/16/20 0746 02/16/20 0801 02/16/20 1418 02/16/20 1549  BP: (!) 174/92  134/74 132/79  Pulse: 76 76  85  Resp: 16 (!) 22  16  Temp: 97.9 F (36.6 C)   98.2 F (36.8 C)  TempSrc:      SpO2: 98% 96%  92%  Weight:        Intake/Output Summary (Last 24 hours) at 02/16/2020 1618 Last data filed at 02/16/2020 0300 Gross per 24 hour  Intake 0 ml  Output --  Net 0 ml   Filed Weights   02/13/20 2049  Weight: 105.1 kg    Examination:  GENERAL: No apparent distress.  Nontoxic. HEENT: MMM.  Vision and hearing grossly intact.  NECK: Supple.  No apparent JVD.  RESP: 96% on 1 L.  No IWOB.  Diminished aeration bilaterally. CVS:  RRR. Heart sounds normal.  ABD/GI/GU: BS+. Abd soft, NTND.  MSK/EXT:  Moves extremities. No apparent deformity. No edema.  SKIN: no apparent skin lesion  or wound NEURO: Awake, alert and oriented appropriately.  No apparent focal neuro deficit. PSYCH: Calm. Normal affect.  Procedures:  02/13/2020-ENB  Microbiology summarized:   Assessment & Plan: Asthma exacerbation s/p fiberoptic bronchoscopy-improving. -Continue steroid, azithromycin and breathing treatments -Discontinue Coreg. -Wean oxygen as able  Hemoptysis status post fiberoptic bronchoscopy-Xarelto resumed yesterday. -Hold Xarelto today -Continue monitoring  Lung adenocarcinoma-PCCM discussed with the navigator and outpatient follow-up with medical and radiation oncology arranged. -Outpatient follow-up-arranged.  HIV: Undetectable viral load and CD4 count of 669 on 11/10/2019.   -Continue home medications  Paroxysmal A. Fib: Rate controlled. -Holding Xarelto due to hemoptysis. -Continue home Cardizem  Essential hypertension: BP elevated. -Discontinue Coreg in the setting of severe asthma. -Continue clonidine and Cardizem -Add scheduled hydralazine  AKI on CKD-3A- improved. -Continue monitoring  Hypomagnesemia/hypophosphatemia-resolved.  Thyroid nodule -OP FNA, "xarelto does not need to be help per IR"  Debility/physical deconditioning-therapy recommended SNF but patient would prefer to go home. -We will maximize home health and DME on discharge. -OOB/PT/OT.  Morbid obesity Body mass index is 40.4 kg/m.  Encourage lifestyle change to lose weight.       DVT prophylaxis:  SCDs Start: 02/13/20 1728  Code Status: Full code Family Communication: Patient has no family members. Status is: Inpatient  Remains inpatient appropriate because:Inpatient level of care appropriate due to severity of illness   Dispo: The patient is from: Home  Anticipated d/c is to: Home              Anticipated d/c date is: 1 day              Patient currently is not medically stable to d/c.       Consultants:  PCCM   Sch Meds:  Scheduled Meds: .  azithromycin  500 mg Oral Daily  . bictegravir-emtricitabine-tenofovir AF  1 tablet Oral Daily  . cloNIDine  0.3 mg Transdermal Weekly  . diltiazem  240 mg Oral Daily  . fluticasone furoate-vilanterol  1 puff Inhalation Daily  . furosemide  20 mg Oral QODAY  . hydrALAZINE  50 mg Oral Q8H  . melatonin  5 mg Oral QHS  . [START ON 02/17/2020] predniSONE  40 mg Oral Q breakfast  . sodium chloride flush  3 mL Intravenous Q12H   Continuous Infusions: . sodium chloride    . lactated ringers 10 mL/hr at 02/13/20 1417   PRN Meds:.sodium chloride, acetaminophen **OR** acetaminophen, albuterol, phenol, polyethylene glycol, senna-docusate, sodium chloride flush  Antimicrobials: Anti-infectives (From admission, onward)   Start     Dose/Rate Route Frequency Ordered Stop   02/16/20 1015  azithromycin (ZITHROMAX) tablet 500 mg     Discontinue     500 mg Oral Daily 02/16/20 1013 02/19/20 0959   02/15/20 1000  azithromycin (ZITHROMAX) 500 mg in sodium chloride 0.9 % 250 mL IVPB  Status:  Discontinued        500 mg 250 mL/hr over 60 Minutes Intravenous Every 24 hours 02/15/20 0948 02/16/20 1013   02/13/20 2000  bictegravir-emtricitabine-tenofovir AF (BIKTARVY) 50-200-25 MG per tablet 1 tablet     Discontinue     1 tablet Oral Daily 02/13/20 1742         I have personally reviewed the following labs and images: CBC: Recent Labs  Lab 02/16/20 0844  WBC 7.8  HGB 13.2  HCT 40.9  MCV 96.0  PLT 206   BMP &GFR Recent Labs  Lab 02/15/20 0324 02/16/20 0844  NA 138 134*  K 4.1 4.4  CL 102 97*  CO2 29 28  GLUCOSE 135* 205*  BUN 12 20  CREATININE 1.38* 1.30*  CALCIUM 9.5 10.2  MG 1.6* 1.8  PHOS 2.1* 2.6   Estimated Creatinine Clearance: 50.5 mL/min (A) (by C-G formula based on SCr of 1.3 mg/dL (H)). Liver & Pancreas: No results for input(s): AST, ALT, ALKPHOS, BILITOT, PROT, ALBUMIN in the last 168 hours. No results for input(s): LIPASE, AMYLASE in the last 168 hours. No results for  input(s): AMMONIA in the last 168 hours. Diabetic: No results for input(s): HGBA1C in the last 72 hours. No results for input(s): GLUCAP in the last 168 hours. Cardiac Enzymes: No results for input(s): CKTOTAL, CKMB, CKMBINDEX, TROPONINI in the last 168 hours. No results for input(s): PROBNP in the last 8760 hours. Coagulation Profile: Recent Labs  Lab 02/13/20 1242  INR 1.0   Thyroid Function Tests: No results for input(s): TSH, T4TOTAL, FREET4, T3FREE, THYROIDAB in the last 72 hours. Lipid Profile: No results for input(s): CHOL, HDL, LDLCALC, TRIG, CHOLHDL, LDLDIRECT in the last 72 hours. Anemia Panel: No results for input(s): VITAMINB12, FOLATE, FERRITIN, TIBC, IRON, RETICCTPCT in the last 72 hours. Urine analysis:    Component Value Date/Time   COLORURINE YELLOW 09/07/2019 [place] 09/07/2019 1528   APPEARANCEUR Clear 02/07/2018 1437   LABSPEC 1.024 09/07/2019 1528   PHURINE 5.5 09/07/2019 1528   GLUCOSEU  NEGATIVE 09/07/2019 1528   HGBUR NEGATIVE 09/07/2019 1528   BILIRUBINUR Negative 02/07/2018 1437   [place] (A) 09/07/2019 1528   PROTEINUR TRACE (A) 09/07/2019 1528   NITRITE NEGATIVE 09/07/2019 [place] 09/07/2019 1528   Sepsis Labs: Invalid input(s): PROCALCITONIN, [place]  Microbiology: Recent Results (from the past 240 hour(s))  SARS CORONAVIRUS 2 (TAT 6-24 HRS) Nasopharyngeal Nasopharyngeal Swab     Status: None   Collection Time: 02/10/20 11:30 AM   Specimen: Nasopharyngeal Swab  Result Value Ref Range Status   SARS Coronavirus 2 NEGATIVE NEGATIVE Final    Comment: (NOTE) SARS-CoV-2 target nucleic acids are NOT DETECTED.  The SARS-CoV-2 RNA is generally detectable in upper and lower respiratory specimens during the acute phase of infection. Negative results do not preclude SARS-CoV-2 infection, do not rule out co-infections with other pathogens, and should not be used as the sole basis for treatment or  other patient management decisions. Negative results must be combined with clinical observations, patient history, and epidemiological information. The expected result is Negative.  Fact Sheet for Patients: SugarRoll.be  Fact Sheet for Healthcare Providers: https://www.woods-mathews.com/  This test is not yet approved or cleared by the [place] FDA and  has been authorized for detection and/or diagnosis of SARS-CoV-2 by FDA under an Emergency Use Authorization (EUA). This EUA will remain  in effect (meaning this test can be used) for the duration of the COVID-19 declaration under Se ction 564(b)(1) of the Act, 21 U.S.C. section 360bbb-3(b)(1), unless the authorization is terminated or revoked sooner.  Performed at [facility], [place] [address], [place], [place] [address]     Radiology Studies: No results found.    Hyland Mollenkopf T. [place]  If 7PM-7AM, please contact night-coverage [email or web address] Password [facility] 02/16/2020, 4:18 PM

## 2020-02-16 NOTE — Progress Notes (Signed)
RN called to pts room because pt complaining about IV needed to be removed. RN explained that she will have another dose of IV medication in the morning and that it would be best to keep it in. Pt stated that she will remove it herself that she can't stand it. RN removed IV. Pt is refusing another IV at this time.

## 2020-02-17 ENCOUNTER — Inpatient Hospital Stay (HOSPITAL_COMMUNITY): Payer: Medicare Other

## 2020-02-17 ENCOUNTER — Other Ambulatory Visit (HOSPITAL_COMMUNITY): Payer: Medicare Other

## 2020-02-17 DIAGNOSIS — I483 Typical atrial flutter: Secondary | ICD-10-CM

## 2020-02-17 DIAGNOSIS — B20 Human immunodeficiency virus [HIV] disease: Secondary | ICD-10-CM

## 2020-02-17 DIAGNOSIS — C3492 Malignant neoplasm of unspecified part of left bronchus or lung: Secondary | ICD-10-CM

## 2020-02-17 LAB — BASIC METABOLIC PANEL
Anion gap: 10 (ref 5–15)
BUN: 34 mg/dL — ABNORMAL HIGH (ref 8–23)
CO2: 31 mmol/L (ref 22–32)
Calcium: 10.3 mg/dL (ref 8.9–10.3)
Chloride: 98 mmol/L (ref 98–111)
Creatinine, Ser: 1.52 mg/dL — ABNORMAL HIGH (ref 0.44–1.00)
GFR calc Af Amer: 41 mL/min — ABNORMAL LOW (ref >60)
GFR calc non Af Amer: 36 mL/min — ABNORMAL LOW (ref >60)
Glucose, Bld: 124 mg/dL — ABNORMAL HIGH (ref 70–99)
Potassium: 4.6 mmol/L (ref 3.5–5.1)
Sodium: 139 mmol/L (ref 135–145)

## 2020-02-17 LAB — MAGNESIUM: Magnesium: 2 mg/dL (ref 1.7–2.4)

## 2020-02-17 LAB — PHOSPHORUS: Phosphorus: 3.2 mg/dL (ref 2.5–4.6)

## 2020-02-17 MED ORDER — FUROSEMIDE 20 MG PO TABS
20.0000 mg | ORAL_TABLET | ORAL | 2 refills | Status: DC
Start: 2020-02-17 — End: 2020-11-14

## 2020-02-17 MED ORDER — PREDNISONE 20 MG PO TABS: 40.0000 mg | ORAL_TABLET | Freq: Every day | ORAL | 0 refills | Status: DC

## 2020-02-17 MED ORDER — RIVAROXABAN 20 MG PO TABS: 20.0000 mg | ORAL_TABLET | Freq: Every day | ORAL | Status: DC

## 2020-02-17 MED ORDER — SPIRONOLACTONE 25 MG PO TABS: 12.5000 mg | ORAL_TABLET | Freq: Every day | ORAL | 1 refills | Status: DC

## 2020-02-17 NOTE — Evaluation (Signed)
Occupational Therapy Evaluation Patient Details Name: Shannon [name] MRN: [id number] DOB: February 01, 1956 Today's Date: 02/17/2020    History of Present Illness Pt is a 64 y/o female who is s/p bronchoscopy of L upper lobe lung nodule. Pt with difficulty recovering from anesthesia and was admitted overnight. PMH includes HTN, COPD, HIV, and a fib.    Clinical Impression   She admitted with the above diagnosis, and demonstrates the below listed deficits.  She is able to perform ADLs with Supervision/setup, but requires multiple rest breaks.   02 sats 89-93% on RA.   She reports she lives alone, and has good support from neighbors.  She is insistent she will be able to manage at home.  Optimally feel she would benefit from 24 hour supervision, but pt doesn't feel that this is necessary.   Her plan is to discharge home today.  Discussed energy conservation techniques.      Follow Up Recommendations  No OT follow up;Supervision/Assistance - 24 hour;Supervision - Intermittent    Equipment Recommendations  Hospital bed    Recommendations for Other Services       Precautions / Restrictions Precautions Precautions: Fall      Mobility Bed Mobility               General bed mobility comments: Pt sitting EOB getting dressed   Transfers Overall transfer level: Needs assistance Equipment used: None Transfers: Sit to/from Stand;Stand Pivot Transfers Sit to Stand: Supervision Stand pivot transfers: Min guard            Balance Overall balance assessment: Needs assistance Sitting-balance support: No upper extremity supported;Feet supported Sitting balance-Leahy Scale: Good     Standing balance support: During functional activity Standing balance-Leahy Scale: Fair Standing balance comment: able to maintain static standing with min guard assist                            ADL either performed or assessed with clinical judgement   ADL Overall ADL's : Needs  assistance/impaired Eating/Feeding: Independent   Grooming: Wash/dry hands;Wash/dry face;Oral care;Brushing hair;Set up;Sitting   Upper Body Bathing: Set up;Supervision/ safety;Sitting   Lower Body Bathing: Set up;Supervison/ safety;Sit to/from stand   Upper Body Dressing : Set up;Sitting   Lower Body Dressing: Supervision/safety;Sit to/from stand   Toilet Transfer: Supervision/safety;Ambulation;Comfort height toilet;Grab bars   Toileting- Clothing Manipulation and Hygiene: Supervision/safety;Sit to/from Nurse, children's Details (indicate cue type and reason): Pt reports she will sponge bathe  Functional mobility during ADLs: Supervision/safety       Vision Baseline Vision/History: No visual deficits Patient Visual Report: No change from baseline       Perception     Praxis      Pertinent Vitals/Pain Pain Assessment: Faces Faces Pain Scale: No hurt     Hand Dominance Right   Extremity/Trunk Assessment Upper Extremity Assessment Upper Extremity Assessment: Generalized weakness   Lower Extremity Assessment Lower Extremity Assessment: Generalized weakness   Cervical / Trunk Assessment Cervical / Trunk Assessment: Normal   Communication Communication Communication: No difficulties   Cognition Arousal/Alertness: Awake/alert Behavior During Therapy: WFL for tasks assessed/performed Overall Cognitive Status: No family/caregiver present to determine baseline cognitive functioning                                 General Comments: Pt is very tangential and frequently repeats herself.  She doesn't appear to be aware that she is repeating info.  She demonstrates decreased insight into deficits and decreased safety awareness    General Comments  Pt with DOE 3/4 with ADLs.  She takes frequent seated rest breaks.  02 sats 89-93% on RA.      Exercises Exercises: Other exercises Other Exercises Other Exercises: reviewed energy conservation  techniques with pt.  She insists she will be fine at home, and that her neighbors will assist her with anything she needs at discharge.     Shoulder Instructions      Home Living Family/patient expects to be discharged to:: Private residence Living Arrangements: Alone Available Help at Discharge: Neighbor Type of Home: Other(Comment) (town house ) Home Access: Level entry     [place]: Two level Alternate Level Stairs-Number of Steps: flight Alternate Level Stairs-Rails: Right Bathroom Shower/Tub: Teacher, early years/pre: [place]: Environmental consultant - 2 wheels;Cane - single point   Additional Comments: on 1.5 L oxygen at night.  Pt initially states she will stay on the first floor upon discharge, but later states her bed has not been delivered, so she will have to negotiate a flight of stairs to access her bedroom       Prior Functioning/Environment Level of Independence: Independent with assistive device(s)        Comments: Does not drive - depends on SCAT.  Was using cane for ambulation         OT Problem List: Decreased activity tolerance;Impaired balance (sitting and/or standing);Cardiopulmonary status limiting activity      OT Treatment/Interventions:      OT Goals(Current goals can be found in the care plan section) Acute Rehab OT Goals Patient Stated Goal: To get home today  OT Goal Formulation: All assessment and education complete, [place] therapy  OT Frequency:     Barriers to D/C: Decreased caregiver support          Co-evaluation              AM-PAC OT "6 Clicks" Daily Activity     Outcome Measure Help from another person eating meals?: None Help from another person taking care of personal grooming?: A Little Help from another person toileting, which includes using toliet, bedpan, or urinal?: A Little Help from another person bathing (including washing, rinsing, drying)?: A Little Help from another person to put on and taking  off regular upper body clothing?: A Little Help from another person to put on and taking off regular lower body clothing?: A Little 6 Click Score: 19   End of Session    Activity Tolerance: Patient limited by fatigue Patient left: in bed;with call bell/phone within reach  OT Visit Diagnosis: Muscle weakness (generalized) (M62.81)                Time: 8786-7672 OT Time Calculation (min): 33 min Charges:  OT General Charges $OT Visit: 1 Visit OT Evaluation $OT Eval Moderate Complexity: 1 Mod $OT Eval High Complexity: 1 High OT Treatments $Self Care/Home Management : 8-22 mins  Nilsa Nutting., OTR/L Acute Rehabilitation Services Pager [phone] Office [phone]   Lucille Passy M 02/17/2020, 3:20 PM

## 2020-02-17 NOTE — Plan of Care (Signed)

## 2020-02-17 NOTE — TOC Transition Note (Addendum)
Transition of Care [facility]) - CM/SW Discharge Note   Patient Details  Name: Shannon [name] MRN: [id number] Date of Birth: Sep 07, 1955  [facility] CM/SW Contact:  Carles Collet, RN Phone Number: 02/17/2020, 8:21 AM   Clinical Narrative:    Spoke w patient over the phone. She states that she has oxygen at home.  She states that her plan is to call [employer] once she gets home to get her bed delivered. She states that her neighbors will offer very limited support, revisited SNF at [place] and she continues to decline. She would like PCS services through Medicaid, I reviewed with her that she will need to get theese services established through her PCP office, and that they take several months to get approved and set up. She verbalized understanding. She would like [place] services. She states that she has had [facility] (not sure if active). Referral placed to [facility], pending approval.  She verbalizes understanding that any [place] will only provide 1 hour visits, a few times a week, and will not start until after she is home for 2-3 days.  Confirmed she has a nebulizer for home use.   She will need PTAR transport home. She does not want DME 3/1 that has been delivered to room, states that her bathroom is on the first level.  Requests transport for around 1pm. Confirms address and that she can get herself into the house.   [facility] confirmed    Final next level of care: [place] Barriers to Discharge: No Barriers Identified   Patient Goals and CMS Choice Patient states their goals for this hospitalization and ongoing recovery are:: Wants to go home and feel better CMS Medicare.gov Compare Post Acute Care list provided to:: Patient Choice offered to / list presented to : Patient  Discharge Placement                       Discharge Plan and Services In-house Referral: NA Discharge Planning Services: CM Consult Post Acute Care Choice: NA          DME  Arranged: Bedside commode, Other see comment (bedside table) DME Agency: [facility] Date DME Agency Contacted: 02/14/20 Time DME Agency Contacted: [id number] Representative spoke with at DME Agency: Thedore Mins [facility] Arranged: RN, PT, OT, Nurse's Aide [place] Agency: [place] Date [place]: 02/17/20 Time [place]: [phone] Representative spoke with at [place]: [place] ([place]) Interventions     Readmission Risk Interventions No flowsheet data found.

## 2020-02-17 NOTE — Discharge Summary (Signed)
Physician Discharge Summary  Shannon [name] HYQ:[id number] DOB: 1956-01-05 DOA: 02/13/2020  PCP: [name], MD  Admit date: 02/13/2020 Discharge date: 02/17/2020   Admitted From: Home Disposition: Home  Recommendations for Outpatient Follow-up:  1. Follow ups as below. 2. Please obtain CBC/BMP/Mag at follow up 3. Please follow up on the following pending results: None  Home Health: PT/OT/RN/aide/CSW Equipment/Devices: Bedside commode and bedside table  Discharge Condition: Stable CODE STATUS: Full code   Follow-up Information    [name], MD. Schedule an appointment as soon as possible for a visit in 1 week(s).   Specialty: Internal Medicine Contact information: [place] [address] [phone]               Hospital Course: 64 year old female with history of HIV on HART, severe persistent asthma, A. fib/flutter on Xarelto, LUL nodule /mass and morbid obesity.  She underwent ENB for evaluation of LUL pulmonary nodule on 02/13/2020, had slow recovery from anesthesia.  She was admitted to ICU for observation to ensure stability but found to be in asthma exacerbation.  She was started on steroid, antibiotics and breathing treatments.  Eventually improved and transferred to [facility] on 02/16/2020.  She continued on steroids, breathing treatments and antibiotics with improvement in her breathing.   Procedure revealed adenocarcinoma.  A follow-up with medical and radiation oncology set up.   Evaluated by therapy who initially recommended SNF. However, patient preferred to go home.  She also improved tremendously.   On the day of discharge, she was ambulated on room air and maintained saturation 89 to 93%.  Felt well and ready to go home and rest.   See individual problem list below for more hospital course.  Discharge Diagnoses:  Asthma exacerbation s/p fiberoptic bronchoscopy-improved.  Ambulated on room air and maintained  saturation from 89 to 93%. -Received azithromycin for 3 days.  Discharged on prednisone 40 mg for 2 more days. -Continue home breathing treatments  Hemoptysis status post fiberoptic bronchoscopy-improvement. -Advised to resume Xarelto in 2 to 3 days.  Lung adenocarcinoma-PCCM discussed with the navigator and outpatient follow-up with medical and radiation oncology arranged oncology navigator note. -Outpatient follow-up-arranged.  HIV: Undetectable viral load and CD4 count of 669 on 11/10/2019.   -Continue home medications  Paroxysmal A. Fib: Rate controlled. -Holding Xarelto for 2 to 3 days -Continue home Cardizem  Essential hypertension: BP elevated. -Discontinue Coreg in the setting of severe asthma. -Continue clonidine and Cardizem CD -Also on Lasix and Aldactone.  AKI on CKD-3A- improved. -Decreased Lasix to 20 mg every other day -Recheck BMP in 1 to 2 weeks  Hypomagnesemia/hypophosphatemia-resolved.  Thyroid nodule -OP FNA, "xarelto does not need to be help per IR" -Outpatient follow-up.  Debility/physical deconditioning-improved. -Home health PT/OT/RN/aide/CSW ordered.  Morbid obesity Body mass index is 40.4 kg/m.  Encourage lifestyle change to lose weight.          Discharge Exam: Vitals:   02/16/20 1957 02/16/20 2301  BP: (!) 179/87 (!) 152/93  Pulse: 88 88  Resp: 16 18  Temp: 98.1 F (36.7 C) 98.2 F (36.8 C)  SpO2: 92% 100%    GENERAL: No apparent distress.  Nontoxic. HEENT: MMM.  Vision and hearing grossly intact.  NECK: Supple.  No apparent JVD.  RESP: On room air.  No IWOB.  Fair aeration bilaterally. CVS:  RRR. Heart sounds normal.  ABD/GI/GU: Bowel sounds present. Soft. Non tender.  MSK/EXT:  Moves extremities. No apparent deformity. No edema.  SKIN: no apparent skin lesion or wound NEURO: Awake, alert and oriented appropriately.  No apparent focal neuro deficit. PSYCH: Calm. Normal affect.  Discharge Instructions  Discharge  Instructions    Call MD for:  difficulty breathing, headache or visual disturbances   Complete by: As directed    Call MD for:  extreme fatigue   Complete by: As directed    Call MD for:  severe uncontrolled pain   Complete by: As directed    Call MD for:  temperature >100.4   Complete by: As directed    Diet - low sodium heart healthy   Complete by: As directed    Discharge instructions   Complete by: As directed    It has been a pleasure taking care of you!  You were hospitalized due to asthma exacerbation after lung biopsy/bronchoscopy.  You were treated with antibiotic, steroid and breathing treatments with improvement in his symptoms.  We are discharging you more steroid to complete treatment course.  Continue using your breathing treatments.  Please follow-up with oncology to discuss about treatment options for the lung cancer.   We may have made some changes to your home medications during this hospitalization. Please review your new medication list and the directions carefully before you take them.    Please go to your hospital follow-up appointments or call to reschedule as recommended.   Take care,   Discharge patient   Complete by: As directed    After chest x-ray has been evaluated   Discharge disposition: 01-Home or Self Care   Discharge patient date: 02/13/2020   Increase activity slowly   Complete by: As directed      Allergies as of 02/17/2020      Reactions   Tree Extract Swelling, Other (See Comments)   Swelling to eyes   Augmentin [amoxicillin-pot Clavulanate] Other (See Comments)   Headache, dizzy   Lisinopril Cough   Face/throat swelling   Ciprofloxacin Hives      Medication List    TAKE these medications   acetaminophen 325 MG tablet Commonly known as: TYLENOL Take 325 mg by mouth every 6 (six) hours as needed (FOR PAIN).   albuterol (2.5 MG/3ML) 0.083% nebulizer solution Commonly known as: PROVENTIL Take 3 mLs (2.5 mg total) by nebulization  every 6 (six) hours as needed for wheezing or shortness of breath. USE 1 VIAL VIA NEBULIZER EVERY 6 HOURS AS NEEDED FOR WHEEZING OR SHORTNESS OF BREATH What changed: See the new instructions.   ProAir HFA 108 (90 Base) MCG/ACT inhaler Generic drug: albuterol Inhale 2 puffs into the lungs every 4 (four) hours as needed for wheezing or shortness of breath. INHALE 2 PUFFS INTO THE LUNGS EVERY 4 HOURS AS NEEDED FOR WHEEZING OR SHORTNESS OF BREATH What changed: See the new instructions.   b complex vitamins tablet Take 1 tablet by mouth daily.   B-D SYRINGE/NEEDLE 1CC/25GX5/8 25G X 5/8" 1 ML Misc Generic drug: SYRINGE/NEEDLE (DISP) 1 ML 1 Units by Does not apply route every 30 (thirty) days.   Biktarvy 50-200-25 MG Tabs tablet Generic drug: bictegravir-emtricitabine-tenofovir AF Take 1 tablet by mouth daily.   Biotene Dry Mouth Gentle Liqd Use as directed 1 Dose in the mouth or throat 2 (two) times daily as needed (dry mouth).   BIOTIN PO Take 1 tablet by mouth daily as needed (for supplementation).   calcium carbonate 1500 (600 Ca) MG Tabs tablet Commonly known as: OSCAL Take 600 mg of elemental calcium by mouth daily with breakfast.  cloNIDine 0.3 mg/24hr patch Commonly known as: CATAPRES - Dosed in mg/24 hr Place 0.3 mg onto the skin once a week.   cyanocobalamin 1000 MCG/ML injection Commonly known as: (VITAMIN B-12) Inject 1 mL (1,000 mcg total) into the muscle every 30 (thirty) days. ADMINISTER 1 ML(1000 MCG) IN THE MUSCLE EVERY 30 DAYS What changed: See the new instructions.   diltiazem 240 MG 24 hr capsule Commonly known as: CARDIZEM CD TAKE 1 CAPSULE(240 MG) BY MOUTH DAILY What changed: See the new instructions.   Fish Oil 1000 MG Caps Take 1,000 mg by mouth daily as needed (pt prefrence).   fluticasone 50 MCG/ACT nasal spray Commonly known as: FLONASE Place 2 sprays into both nostrils daily as needed for allergies.   fluticasone furoate-vilanterol 200-25  MCG/INH Aepb Commonly known as: BREO ELLIPTA Inhale 1 puff into the lungs daily.   furosemide 20 MG tablet Commonly known as: Lasix Take 1 tablet (20 mg total) by mouth every other day. What changed:   medication strength  how much to take  when to take this   MAGNESIUM PO Take 1 tablet by mouth every other day.   melatonin 5 MG Tabs Take 5 mg by mouth at bedtime as needed (for sleep).   omeprazole 40 MG capsule Commonly known as: PRILOSEC Take 1 capsule (40 mg total) by mouth daily as needed (heart burn). What changed: See the new instructions.   OXYGEN Inhale 1.5-2 L into the lungs See admin instructions. Uses at bedtime and through the day as needed   polyethylene glycol 17 g packet Commonly known as: MIRALAX / GLYCOLAX Take 17 g by mouth daily as needed for mild constipation.   predniSONE 20 MG tablet Commonly known as: DELTASONE Take 2 tablets (40 mg total) by mouth daily with breakfast.   PreviDent 5000 Sensitive 1.1-5 % Pste Generic drug: Sod Fluoride-Potassium Nitrate Use daily as directed   Pulse Oximeter For Finger Misc 1 Units by Does not apply route as needed.   rivaroxaban 20 MG Tabs tablet Commonly known as: Xarelto Take 1 tablet (20 mg total) by mouth daily with supper. Okay to restart this medication on Thursday, 02/15/2020. Start taking on: February 19, 2020 What changed:   See the new instructions.  These instructions start on February 19, 2020. If you are unsure what to do until then, ask your doctor or other care provider.   senna 8.6 MG tablet Commonly known as: SENOKOT Take 1 tablet by mouth daily as needed for constipation.   SM Vitamin D3 100 MCG (4000 UT) Caps Generic drug: Cholecalciferol Take 1 capsule (4,000 Units total) by mouth daily.   spironolactone 25 MG tablet Commonly known as: ALDACTONE Take 0.5 tablets (12.5 mg total) by mouth daily. What changed: how much to take   vitamin A 3 MG (10000 UNITS) capsule Take 10,000 Units  by mouth daily.   vitamin C 1000 MG tablet Take 1,000 mg by mouth every other day.   zinc gluconate 50 MG tablet Take 50 mg by mouth daily.            Durable Medical Equipment  (From admission, onward)         Start     Ordered   02/14/20 1125  For home use only DME Bedside commode  Once       Question:  Patient needs a bedside commode to treat with the following condition  Answer:  Weakness   02/14/20 1125   02/14/20 1125  For home  use only DME Other see comment  Once       Comments: Bedside table  Question:  Length of Need  Answer:  12 Months   02/14/20 1125          Consultations:  PCCM  Procedures/Studies:  Bronchoscopy   NM PET Image Initial (PI) Skull Base To Thigh  Result Date: 01/26/2020 CLINICAL DATA:  Initial treatment strategy for pulmonary nodule. EXAM: NUCLEAR MEDICINE PET SKULL BASE TO THIGH TECHNIQUE: 11.9 mCi F-18 FDG was injected intravenously. Full-ring PET imaging was performed from the skull base to thigh after the radiotracer. CT data was obtained and used for attenuation correction and anatomic localization. Fasting blood glucose: 122 mg/dl COMPARISON:  CT chest 01/10/2020 FINDINGS: Mediastinal blood pool activity: SUV max 3.1 Liver activity: SUV max NA NECK: Asymmetric hypermetabolism identified in the posterior right nasopharynx, in the region of the fossa of Rosenmuller. SUV max = 9.0. No mass lesion evident on CT imaging. Uptake in the vocal cords may be related to patient speaking after radiopharmaceutical injection. Focal uptake left thyroid lobe with SUV max = 8.2. Asymmetric enlargement of the left thyroid parenchyma noted by noncontrast CT with potential 3.2 cm left thyroid nodule on image 42/4. Incidental CT findings: none CHEST: 2.7 cm left upper lobe pulmonary mass seen on recent CT is hypermetabolic with SUV max = 7.1. Mottled uptake identified in the mediastinum and both hilar regions without definite hypermetabolic lymphadenopathy. Low  level hypermetabolism identified in a left axillary node measuring 7 mm short axis on image 63/4. By report, the patient had recent vaccination in the right upper extremity. While metastatic disease to the left axilla would be somewhat unusual, close follow-up recommended. Incidental CT findings: Coronary artery calcification is evident. Atherosclerotic calcification is noted in the wall of the thoracic aorta. ABDOMEN/PELVIS: No abnormal hypermetabolic activity within the liver, pancreas, adrenal glands, or spleen. No hypermetabolic lymph nodes in the abdomen or pelvis. Incidental CT findings: Multiple tiny bilateral renal lesions of varying size and attenuation are indeterminate but likely reflect a combination of simple and complex cysts. Atherosclerotic calcification noted in the abdominal aorta. Small umbilical hernia contains only fat. SKELETON: No focal hypermetabolic activity to suggest skeletal metastasis. Incidental CT findings: No worrisome lytic or sclerotic osseous abnormality. IMPRESSION: 1. Left upper lobe pulmonary lesion is hypermetabolic consistent with primary bronchogenic neoplasm. No definite hypermetabolic metastatic disease in the left hilum or mediastinum. 2. I enlarged but mildly hypermetabolic left axillary node. Patient reportedly had recent COVID vaccination in the contralateral upper extremity. This would be somewhat atypical for metastatic lung cancer, but close attention on follow-up recommended. 3. Fairly marked asymmetric hypermetabolism in the posterior right nasopharynx, in the region of the fossa of Rosenmuller. No soft tissue mass lesion evident on CT imaging, but direct visualization recommended to exclude mucosal lesion. 4. Hypermetabolism in the left thyroid lobe apparently associated with thyroid nodule. Recommend thyroid US and biopsy. (Ref: J Am Coll Radiol. 2015 Feb;12(2): 143-50). 5. Multiple bilateral renal lesions of varying size and attenuation. MRI of 03/31/2019  characterized bilateral Bosniak I and II cysts. 6.  Aortic Atherosclerois (ICD10-170.0) Electronically Signed   By: Misty Stanley M.D.   On: 01/26/2020 09:23   DG Chest Port 1 View  Result Date: 02/15/2020 CLINICAL DATA:  Abnormal respirations. EXAM: PORTABLE CHEST 1 VIEW COMPARISON:  02/13/2020 PET-CT 01/25/2020. FINDINGS: Patient rotated to the right. Aortic ectasia noted. Stable cardiomegaly. Stable density noted over the left mid lung. Reference is made to recent PET-CT.  No acute infiltrate. No pleural effusion or pneumothorax. IMPRESSION: 1. Stable density noted the left mid lung. Reference is made to recent PET-CT report. No acute infiltrate. 2.  Stable cardiomegaly. Electronically Signed   By: Marcello Moores  Register   On: 02/15/2020 08:08   DG Chest Port 1 View  Result Date: 02/13/2020 CLINICAL DATA:  64 year old female status post left lung bronchoscopy. EXAM: PORTABLE CHEST 1 VIEW COMPARISON:  Chest radiograph dated 01/04/2020 and CT dated 01/10/2020. FINDINGS: Faint focal nodular area in the left mid lung field as seen previously. Several surgical clips noted over the left mid lung field. No new consolidative changes. There is no pleural effusion or pneumothorax. Mild cardiomegaly. No acute osseous pathology. IMPRESSION: No acute cardiopulmonary process.  No pneumothorax. Electronically Signed   By: Anner Crete M.D.   On: 02/13/2020 16:40   US THYROID  Result Date: 02/06/2020 CLINICAL DATA:  Incidental on PET. Hypermetabolic left-sided thyroid nodule demonstrated on PET-CT performed 01/26/2020 EXAM: THYROID ULTRASOUND TECHNIQUE: Ultrasound examination of the thyroid gland and adjacent soft tissues was performed. COMPARISON:  PET-CT-01/26/2020 FINDINGS: Examination is degraded due to patient body habitus and poor sonographic window. Parenchymal Echotexture: Moderately heterogenous Isthmus: Normal in size measures 0.4 cm in diameter Right lobe: Normal in size measuring 4.0 x 2.2 x 1.7 cm Left  lobe: Enlarged measuring 6.3 x 3.9 x 2.5 cm _________________________________________________________ Estimated total number of nodules >/= 1 cm: 5 Number of spongiform nodules >/=  2 cm not described below (TR1): 0 Number of mixed cystic and solid nodules >/= 1.5 cm not described below (TR2): 0 _________________________________________________________ There is an approximately 0.5 x 0.5 x 0.4 cm predominantly isoechoic nodule within the superior pole the right lobe of the thyroid (labeled 1), which does not meet criteria to recommend percutaneous sampling or continued dedicated follow-up _________________________________________________________ There is an approximately 1.4 x 1.4 x 1.0 cm ill-defined isoechoic nodule/pseudonodule within mid aspect of the right lobe of the thyroid (labeled 2) which does not meet criteria to recommend percutaneous sampling or continued dedicated follow-up. There is an approximately 0.9 x 0.5 x 0.5 cm isoechoic nodule within the inferior pole of the right lobe of the thyroid (labeled 3), which does not meet criteria to recommend percutaneous sampling or continued dedicated follow-up. _________________________________________________________ Nodule # 4: Location: Left; Superior Maximum size: 1.4 cm; Other 2 dimensions: 1.4 x 1.3 cm Composition: solid/almost completely solid (2) Echogenicity: hypoechoic (2) Shape: not taller-than-wide (0) Margins: ill-defined (0) Echogenic foci: none (0) ACR TI-RADS total points: 4. ACR TI-RADS risk category: TR4 (4-6 points). ACR TI-RADS recommendations: *Given size (>/= 1 - 1.4 cm) and appearance, a follow-up ultrasound in 1 year should be considered based on TI-RADS criteria. _________________________________________________________ Nodule # 5: Location: Left; Mid - this nodule likely correlates with the hypermetabolic nodule seen on preceding PET-CT Maximum size: 3.0 cm; Other 2 dimensions: 2.7 x 2.3 cm Composition: solid/almost completely solid (2)  Echogenicity: isoechoic (1) Shape: not taller-than-wide (0) Margins: ill-defined (0) Echogenic foci: none (0) ACR TI-RADS total points: 3. ACR TI-RADS risk category: TR3 (3 points). ACR TI-RADS recommendations: **Given size (>/= 2.5 cm) and appearance, fine needle aspiration of this mildly suspicious nodule should be considered based on TI-RADS criteria. _________________________________________________________ Nodule # 6: Location: Left; Inferior Maximum size: 2.1 cm; Other 2 dimensions: 1.7 x 1.5 cm Composition: solid/almost completely solid (2) Echogenicity: isoechoic (1) Shape: not taller-than-wide (0) Margins: ill-defined (0) Echogenic foci: none (0) ACR TI-RADS total points: 3. ACR TI-RADS risk category: TR3 (3 points). ACR  TI-RADS recommendations: *Given size (>/= 1.5 - 2.4 cm) and appearance, a follow-up ultrasound in 1 year should be considered based on TI-RADS criteria. _________________________________________________________ IMPRESSION: 1. Findings suggestive of multinodular goiter. 2. Nodule #5, likely correlating with the hypermetabolic nodule seen on preceding PET-CT, meets imaging criteria to recommend percutaneous sampling as indicated. 3. Nodules #4 and #6 both meet imaging criteria to recommend a 1 year follow-up. The above is in keeping with the ACR TI-RADS recommendations - J Am Coll Radiol 2017;14:587-595. Electronically Signed   By: Sandi Mariscal M.D.   On: 02/06/2020 15:19   DG C-ARM BRONCHOSCOPY  Result Date: 02/13/2020 C-ARM BRONCHOSCOPY: Fluoroscopy was utilized by the requesting physician.  No radiographic interpretation.        The results of significant diagnostics from this hospitalization (including imaging, microbiology, ancillary and laboratory) are listed below for reference.     Microbiology: Recent Results (from the past 240 hour(s))  SARS CORONAVIRUS 2 (TAT 6-24 HRS) Nasopharyngeal Nasopharyngeal Swab     Status: None   Collection Time: 02/10/20 11:30 AM    Specimen: Nasopharyngeal Swab  Result Value Ref Range Status   SARS Coronavirus 2 NEGATIVE NEGATIVE Final    Comment: (NOTE) SARS-CoV-2 target nucleic acids are NOT DETECTED.  The SARS-CoV-2 RNA is generally detectable in upper and lower respiratory specimens during the acute phase of infection. Negative results do not preclude SARS-CoV-2 infection, do not rule out co-infections with other pathogens, and should not be used as the sole basis for treatment or other patient management decisions. Negative results must be combined with clinical observations, patient history, and epidemiological information. The expected result is Negative.  Fact Sheet for Patients: SugarRoll.be  Fact Sheet for Healthcare Providers: https://www.woods-mathews.com/  This test is not yet approved or cleared by the [place] FDA and  has been authorized for detection and/or diagnosis of SARS-CoV-2 by FDA under an Emergency Use Authorization (EUA). This EUA will remain  in effect (meaning this test can be used) for the duration of the COVID-19 declaration under Se ction 564(b)(1) of the Act, 21 U.S.C. section 360bbb-3(b)(1), unless the authorization is terminated or revoked sooner.  Performed at [facility], [place] [address], [place], [place] [address]      Labs: BNP (last 3 results) Recent Labs    05/26/19 1044 01/04/20 1215 02/15/20 0324  BNP 58.4 63.8 01.0   Basic Metabolic Panel: Recent Labs  Lab 02/15/20 0324 02/16/20 0844 02/17/20 0423  NA 138 134* 139  K 4.1 4.4 4.6  CL 102 97* 98  CO2 29 28 31   GLUCOSE 135* 205* 124*  BUN 12 20 34*  CREATININE 1.38* 1.30* 1.52*  CALCIUM 9.5 10.2 10.3  MG 1.6* 1.8 2.0  PHOS 2.1* 2.6 3.2   Liver Function Tests: No results for input(s): AST, ALT, ALKPHOS, BILITOT, PROT, ALBUMIN in the last 168 hours. No results for input(s): LIPASE, AMYLASE in the last 168 hours. No results for input(s): AMMONIA  in the last 168 hours. CBC: Recent Labs  Lab 02/16/20 0844  WBC 7.8  HGB 13.2  HCT 40.9  MCV 96.0  PLT 206   Cardiac Enzymes: No results for input(s): CKTOTAL, CKMB, CKMBINDEX, TROPONINI in the last 168 hours. BNP: Invalid input(s): POCBNP CBG: No results for input(s): GLUCAP in the last 168 hours. D-Dimer No results for input(s): DDIMER in the last 72 hours. Hgb A1c No results for input(s): HGBA1C in the last 72 hours. Lipid Profile No results for input(s): CHOL, HDL, LDLCALC,  TRIG, CHOLHDL, LDLDIRECT in the last 72 hours. Thyroid function studies No results for input(s): TSH, T4TOTAL, T3FREE, THYROIDAB in the last 72 hours.  Invalid input(s): FREET3 Anemia work up No results for input(s): VITAMINB12, FOLATE, FERRITIN, TIBC, IRON, RETICCTPCT in the last 72 hours. Urinalysis    Component Value Date/Time   COLORURINE YELLOW 09/07/2019 1528   APPEARANCEUR CLEAR 09/07/2019 1528   APPEARANCEUR Clear 02/07/2018 1437   LABSPEC 1.024 09/07/2019 1528   PHURINE 5.5 09/07/2019 1528   GLUCOSEU NEGATIVE 09/07/2019 1528   HGBUR NEGATIVE 09/07/2019 1528   BILIRUBINUR Negative 02/07/2018 1437   KETONESUR TRACE (A) 09/07/2019 1528   PROTEINUR TRACE (A) 09/07/2019 1528   NITRITE NEGATIVE 09/07/2019 [place] 09/07/2019 1528   Sepsis Labs Invalid input(s): PROCALCITONIN,  WBC,  LACTICIDVEN   Time coordinating discharge: 35 minutes  SIGNED:  Mercy Riding, MD  [facility] 02/17/2020, 8:13 AM  If 7PM-7AM, please contact night-coverage [email or web address] Password [email or web address]

## 2020-02-17 NOTE — Plan of Care (Signed)
  Problem: Clinical Measurements: Goal: Respiratory complications will improve Outcome: Progressing   

## 2020-02-18 ENCOUNTER — Other Ambulatory Visit: Payer: Self-pay | Admitting: Internal Medicine

## 2020-02-18 DIAGNOSIS — E538 Deficiency of other specified B group vitamins: Secondary | ICD-10-CM

## 2020-02-19 ENCOUNTER — Other Ambulatory Visit: Payer: Self-pay | Admitting: Internal Medicine

## 2020-02-19 ENCOUNTER — Other Ambulatory Visit: Payer: Self-pay | Admitting: Medical Oncology

## 2020-02-19 DIAGNOSIS — E538 Deficiency of other specified B group vitamins: Secondary | ICD-10-CM

## 2020-02-19 DIAGNOSIS — R911 Solitary pulmonary nodule: Secondary | ICD-10-CM

## 2020-02-19 NOTE — Telephone Encounter (Signed)
Need refill on cyanocobalamin (,VITAMIN B-12,) 1000 MCG/ML injection  ;pt contact [place], [place] [facility] [place]

## 2020-02-20 ENCOUNTER — Inpatient Hospital Stay (HOSPITAL_BASED_OUTPATIENT_CLINIC_OR_DEPARTMENT_OTHER): Payer: Medicare Other | Admitting: Internal Medicine

## 2020-02-20 ENCOUNTER — Inpatient Hospital Stay: Payer: Medicare Other

## 2020-02-20 ENCOUNTER — Telehealth: Payer: Self-pay | Admitting: Pulmonary Disease

## 2020-02-20 ENCOUNTER — Encounter: Payer: Self-pay | Admitting: Internal Medicine

## 2020-02-20 ENCOUNTER — Other Ambulatory Visit: Payer: Self-pay

## 2020-02-20 ENCOUNTER — Encounter: Payer: Self-pay | Admitting: *Deleted

## 2020-02-20 DIAGNOSIS — R110 Nausea: Secondary | ICD-10-CM | POA: Diagnosis not present

## 2020-02-20 DIAGNOSIS — R911 Solitary pulmonary nodule: Secondary | ICD-10-CM

## 2020-02-20 DIAGNOSIS — M8580 Other specified disorders of bone density and structure, unspecified site: Secondary | ICD-10-CM | POA: Diagnosis not present

## 2020-02-20 DIAGNOSIS — R079 Chest pain, unspecified: Secondary | ICD-10-CM | POA: Diagnosis not present

## 2020-02-20 DIAGNOSIS — Z21 Asymptomatic human immunodeficiency virus [HIV] infection status: Secondary | ICD-10-CM | POA: Diagnosis not present

## 2020-02-20 DIAGNOSIS — R519 Headache, unspecified: Secondary | ICD-10-CM | POA: Diagnosis not present

## 2020-02-20 DIAGNOSIS — C3412 Malignant neoplasm of upper lobe, left bronchus or lung: Secondary | ICD-10-CM | POA: Diagnosis not present

## 2020-02-20 DIAGNOSIS — C3492 Malignant neoplasm of unspecified part of left bronchus or lung: Secondary | ICD-10-CM | POA: Insufficient documentation

## 2020-02-20 DIAGNOSIS — D649 Anemia, unspecified: Secondary | ICD-10-CM | POA: Diagnosis not present

## 2020-02-20 DIAGNOSIS — J45909 Unspecified asthma, uncomplicated: Secondary | ICD-10-CM | POA: Diagnosis not present

## 2020-02-20 DIAGNOSIS — E041 Nontoxic single thyroid nodule: Secondary | ICD-10-CM | POA: Diagnosis not present

## 2020-02-20 DIAGNOSIS — C3490 Malignant neoplasm of unspecified part of unspecified bronchus or lung: Secondary | ICD-10-CM | POA: Diagnosis not present

## 2020-02-20 DIAGNOSIS — Z86718 Personal history of other venous thrombosis and embolism: Secondary | ICD-10-CM | POA: Diagnosis not present

## 2020-02-20 DIAGNOSIS — I10 Essential (primary) hypertension: Secondary | ICD-10-CM | POA: Diagnosis not present

## 2020-02-20 DIAGNOSIS — J4541 Moderate persistent asthma with (acute) exacerbation: Secondary | ICD-10-CM

## 2020-02-20 DIAGNOSIS — I4891 Unspecified atrial fibrillation: Secondary | ICD-10-CM | POA: Diagnosis not present

## 2020-02-20 LAB — CBC WITH DIFFERENTIAL (CANCER CENTER ONLY)
Abs Immature Granulocytes: 0.02 10*3/uL (ref 0.00–0.07)
Basophils Absolute: 0 10*3/uL (ref 0.0–0.1)
Basophils Relative: 0 %
Eosinophils Absolute: 0.1 10*3/uL (ref 0.0–0.5)
Eosinophils Relative: 1 %
HCT: 40.1 % (ref 36.0–46.0)
Hemoglobin: 12.8 g/dL (ref 12.0–15.0)
Immature Granulocytes: 0 %
Lymphocytes Relative: 27 %
Lymphs Abs: 2.1 10*3/uL (ref 0.7–4.0)
MCH: 30.5 pg (ref 26.0–34.0)
MCHC: 31.9 g/dL (ref 30.0–36.0)
MCV: 95.5 fL (ref 80.0–100.0)
Monocytes Absolute: 0.6 10*3/uL (ref 0.1–1.0)
Monocytes Relative: 8 %
Neutro Abs: 5.2 10*3/uL (ref 1.7–7.7)
Neutrophils Relative %: 64 %
Platelet Count: 203 10*3/uL (ref 150–400)
RBC: 4.2 MIL/uL (ref 3.87–5.11)
RDW: 12.5 % (ref 11.5–15.5)
WBC Count: 8.1 10*3/uL (ref 4.0–10.5)
nRBC: 0 % (ref 0.0–0.2)

## 2020-02-20 LAB — CMP (CANCER CENTER ONLY)
ALT: 20 U/L (ref 0–44)
AST: 15 U/L (ref 15–41)
Albumin: 3.6 g/dL (ref 3.5–5.0)
Alkaline Phosphatase: 69 U/L (ref 38–126)
Anion gap: 6 (ref 5–15)
BUN: 29 mg/dL — ABNORMAL HIGH (ref 8–23)
CO2: 33 mmol/L — ABNORMAL HIGH (ref 22–32)
Calcium: 10 mg/dL (ref 8.9–10.3)
Chloride: 103 mmol/L (ref 98–111)
Creatinine: 1.42 mg/dL — ABNORMAL HIGH (ref 0.44–1.00)
GFR, Est AFR Am: 45 mL/min — ABNORMAL LOW (ref >60)
GFR, Estimated: 39 mL/min — ABNORMAL LOW (ref >60)
Glucose, Bld: 122 mg/dL — ABNORMAL HIGH (ref 70–99)
Potassium: 4 mmol/L (ref 3.5–5.1)
Sodium: 142 mmol/L (ref 135–145)
Total Bilirubin: 0.4 mg/dL (ref 0.3–1.2)
Total Protein: 6.5 g/dL (ref 6.5–8.1)

## 2020-02-20 NOTE — Progress Notes (Signed)
[facility] [phone]   [phone]  OFFICE PROGRESS NOTE  [name], MD Shannon [name] [id number]  DIAGNOSIS: Stage IA (T1c, N0, M0) non-small cell lung cancer, adenocarcinoma presented with left upper lobe pulmonary nodule diagnosed in June 2021.  PRIOR THERAPY: None  CURRENT THERAPY: None  INTERVAL HISTORY: Shannon [name] 64 y.o. female returns to the clinic today for follow-up visit.  The patient is feeling fine today with no concerning complaints except for mild shortness of breath with exertion.  She denied having any current cough or hemoptysis she has occasional chest pain.  She denied having any nausea, vomiting, diarrhea or constipation.  She denied having any headache or visual changes.  She has no fever or chills.  The patient underwent bronchoscopy with endobronchial biopsy of the left upper lobe lung nodule under the care of Dr. Lamonte Sakai and the final pathology was consistent with adenocarcinoma.  She is here today for evaluation and discussion of her treatment options.  MEDICAL HISTORY: Past Medical History:  Diagnosis Date  . Anemia   . Anxiety    HX PANIC ATTACKS  . Arthritis    "starting to; in my hands" (07/09/2015)  . Asthma   . Atrial fibrillation ([place])   . Atrial flutter, paroxysmal ([place])   . Bloated abdomen   . CFS (chronic fatigue syndrome)   . Chewing difficulty   . Chronic asthma with acute exacerbation    "I have chronic asthma all the time; sometimes exacerbations" (07/09/2015)  . Chronic lower back pain   . COPD (chronic obstructive pulmonary disease) ([place])   . Cyst of right kidney    "3 of them; dx'd in ~ 01/2015"  . Dyspnea   . GERD (gastroesophageal reflux disease)   . Heart murmur   . History of blood transfusion    "related to my brain surgery I think"  . History of pulmonary embolism 07/09/2015  . HIV antibody positive ([place])   . HIV disease ([place])   . Hyperlipidemia   . Hypertension     . Leg edema   . Lipodystrophy   . Mild CAD 2013  . Multiple thyroid nodules   . Osteopenia   . Palpitations   . Pneumonia 07/09/2015  . Shingles   . Sleep apnea    "never completed part 2 of study; never wore mask" (07/09/2015)  . Vitamin B 12 deficiency   . Vitamin D deficiency     ALLERGIES:  is allergic to tree extract, augmentin [amoxicillin-pot clavulanate], lisinopril, and ciprofloxacin.  MEDICATIONS:  Current Outpatient Medications  Medication Sig Dispense Refill  . acetaminophen (TYLENOL) 325 MG tablet Take 325 mg by mouth every 6 (six) hours as needed (FOR PAIN).    Shannon [name] albuterol (PROVENTIL) (2.5 MG/3ML) 0.083% nebulizer solution Take 3 mLs (2.5 mg total) by nebulization every 6 (six) hours as needed for wheezing or shortness of breath. USE 1 VIAL VIA NEBULIZER EVERY 6 HOURS AS NEEDED FOR WHEEZING OR SHORTNESS OF BREATH    . Ascorbic Acid (VITAMIN C) 1000 MG tablet Take 1,000 mg by mouth every other day.     . b complex vitamins tablet Take 1 tablet by mouth daily.    . bictegravir-emtricitabine-tenofovir AF (BIKTARVY) 50-200-25 MG TABS tablet Take 1 tablet by mouth daily. 30 tablet 5  . BIOTIN PO Take 1 tablet by mouth daily as needed (for supplementation).     . calcium carbonate (OSCAL) 1500 (600 Ca)  MG TABS tablet Take 600 mg of elemental calcium by mouth daily with breakfast.    . Cholecalciferol (SM VITAMIN D3) 100 MCG (4000 UT) CAPS Take 1 capsule (4,000 Units total) by mouth daily. 30 capsule 0  . cloNIDine (CATAPRES - DOSED IN MG/24 HR) 0.3 mg/24hr patch Place 0.3 mg onto the skin once a week.     . cyanocobalamin (,VITAMIN B-12,) 1000 MCG/ML injection ADMINISTER 1 ML(1000 MCG) IN THE MUSCLE EVERY 30 DAYS 1 mL 0  . diltiazem (CARDIZEM CD) 240 MG 24 hr capsule TAKE 1 CAPSULE(240 MG) BY MOUTH DAILY (Patient taking differently: Take 240 mg by mouth daily. ) 90 capsule 3  . fluticasone (FLONASE) 50 MCG/ACT nasal spray Place 2 sprays into both nostrils daily as needed for  allergies.    . fluticasone furoate-vilanterol (BREO ELLIPTA) 200-25 MCG/INH AEPB Inhale 1 puff into the lungs daily. 1 each 5  . furosemide (LASIX) 20 MG tablet Take 1 tablet (20 mg total) by mouth every other day. 30 tablet 2  . MAGNESIUM PO Take 1 tablet by mouth every other day.     . Melatonin 5 MG TABS Take 5 mg by mouth at bedtime as needed (for sleep).    . Misc. Devices (PULSE OXIMETER FOR FINGER) MISC 1 Units by Does not apply route as needed. 1 each 0  . Mouthwashes (BIOTENE DRY MOUTH GENTLE) LIQD Use as directed 1 Dose in the mouth or throat 2 (two) times daily as needed (dry mouth).    . Omega-3 Fatty Acids (FISH OIL) 1000 MG CAPS Take 1,000 mg by mouth daily as needed (pt prefrence).    Shannon [name] omeprazole (PRILOSEC) 40 MG capsule Take 1 capsule (40 mg total) by mouth daily as needed (heart burn).    . OXYGEN Inhale 1.5-2 L into the lungs See admin instructions. Uses at bedtime and through the day as needed    . polyethylene glycol (MIRALAX / GLYCOLAX) 17 g packet Take 17 g by mouth daily as needed for mild constipation.    . predniSONE (DELTASONE) 20 MG tablet Take 2 tablets (40 mg total) by mouth daily with breakfast. 4 tablet 0  . PREVIDENT 5000 SENSITIVE 1.1-5 % PSTE Use daily as directed  1  . PROAIR HFA 108 (90 Base) MCG/ACT inhaler Inhale 2 puffs into the lungs every 4 (four) hours as needed for wheezing or shortness of breath. INHALE 2 PUFFS INTO THE LUNGS EVERY 4 HOURS AS NEEDED FOR WHEEZING OR SHORTNESS OF BREATH    . rivaroxaban (XARELTO) 20 MG TABS tablet Take 1 tablet (20 mg total) by mouth daily with supper. Okay to restart this medication on Thursday, 02/15/2020. 30 tablet   . senna (SENOKOT) 8.6 MG tablet Take 1 tablet by mouth daily as needed for constipation.    Shannon [name] spironolactone (ALDACTONE) 25 MG tablet Take 1 tablet (25 mg total) by mouth daily. 90 tablet 1  . spironolactone (ALDACTONE) 25 MG tablet Take 0.5 tablets (12.5 mg total) by mouth daily. 90 tablet 1  .  SYRINGE/NEEDLE, DISP, 1 ML (B-D SYRINGE/NEEDLE 1CC/25GX5/8) 25G X 5/8" 1 ML MISC 1 Units by Does not apply route every 30 (thirty) days. 50 each 0  . vitamin A 3 MG (10000 UNITS) capsule Take 10,000 Units by mouth daily.    Shannon [name] zinc gluconate 50 MG tablet Take 50 mg by mouth daily.     No current facility-administered medications for this visit.    SURGICAL HISTORY:  Past Surgical History:  Procedure Laterality  Date  . ABDOMINAL HYSTERECTOMY     "robotic laparosopic"  . BRAIN SURGERY  1974   "brain tumor; benign; on top of my brain; got a plate in there"  . BRAIN SURGERY     age 23- -"Tumor pushing my skullout"  . BRONCHIAL BIOPSY  02/13/2020   Procedure: BRONCHIAL BIOPSIES;  Surgeon: [name], MD;  Location: [facility] ENDOSCOPY;  Service: Pulmonary;;  . BRONCHIAL BRUSHINGS  02/13/2020   Procedure: BRONCHIAL BRUSHINGS;  Surgeon: [name], MD;  Location[facility] ENDOSCOPY;  Service: Pulmonary;;  . BRONCHIAL NEEDLE ASPIRATION BIOPSY  02/13/2020   Procedure: BRONCHIAL NEEDLE ASPIRATION BIOPSIES;  Surgeon: [name], MD;  Location: [facility] ENDOSCOPY;  Service: Pulmonary;;  . CARDIAC CATHETERIZATION    . FIDUCIAL MARKER PLACEMENT  02/13/2020   Procedure: FIDUCIAL MARKER PLACEMENT;  Surgeon: [name], MD;  Location: [facility] ENDOSCOPY;  Service: Pulmonary;;  . TONSILLECTOMY AND ADENOIDECTOMY    . VIDEO BRONCHOSCOPY WITH ENDOBRONCHIAL NAVIGATION N/A 02/13/2020   Procedure: VIDEO BRONCHOSCOPY WITH ENDOBRONCHIAL NAVIGATION;  Surgeon: [name], MD;  Location: [facility] ENDOSCOPY;  Service: Pulmonary;  Laterality: N/A;    REVIEW OF SYSTEMS:  Constitutional: positive for fatigue Eyes: negative Ears, nose, mouth, throat, and face: negative Respiratory: positive for cough and dyspnea on exertion Cardiovascular: negative Gastrointestinal: negative Genitourinary:negative Integument/breast: negative Hematologic/lymphatic: negative Musculoskeletal:negative Neurological: negative Behavioral/Psych:  negative Endocrine: negative Allergic/Immunologic: negative   PHYSICAL EXAMINATION: General appearance: alert, cooperative, fatigued and no distress Head: Normocephalic, without obvious abnormality, atraumatic Neck: no adenopathy, no JVD, supple, symmetrical, trachea midline and thyroid not enlarged, symmetric, no tenderness/mass/nodules Lymph nodes: Cervical, supraclavicular, and axillary nodes normal. Resp: clear to auscultation bilaterally Back: symmetric, no curvature. ROM normal. No CVA tenderness. Cardio: regular rate and rhythm, S1, S2 normal, no murmur, click, rub or gallop GI: soft, non-tender; bowel sounds normal; no masses,  no organomegaly Extremities: extremities normal, atraumatic, no cyanosis or edema Neurologic: Alert and oriented X 3, normal strength and tone. Normal symmetric reflexes. Normal coordination and gait  ECOG PERFORMANCE STATUS: 1 - Symptomatic but completely ambulatory  Blood pressure (!) 160/103, pulse 85, temperature 97.9 F (36.6 C), temperature source Temporal, resp. rate 19, height 5' 3.5" (1.613 m), weight 231 lb 1.6 oz (104.8 kg), SpO2 94 %.  LABORATORY DATA: Lab Results  Component Value Date   WBC 7.8 02/16/2020   HGB 13.2 02/16/2020   HCT 40.9 02/16/2020   MCV 96.0 02/16/2020   PLT 206 02/16/2020      Chemistry      Component Value Date/Time   NA 139 02/17/2020 0423   NA 145 (H) 02/23/2019 1100   K 4.6 02/17/2020 0423   CL 98 02/17/2020 0423   CO2 31 02/17/2020 0423   BUN 34 (H) 02/17/2020 0423   BUN 17 02/23/2019 1100   CREATININE 1.52 (H) 02/17/2020 0423   CREATININE 1.64 (H) 02/06/2020 1345   CREATININE 1.35 (H) 11/10/2019 1027      Component Value Date/Time   CALCIUM 10.3 02/17/2020 0423   ALKPHOS 79 02/06/2020 1345   AST 22 02/06/2020 1345   ALT 18 02/06/2020 1345   BILITOT 0.5 02/06/2020 1345       RADIOGRAPHIC STUDIES: NM PET Image Initial (PI) Skull Base To Thigh  Result Date: 01/26/2020 CLINICAL DATA:  Initial  treatment strategy for pulmonary nodule. EXAM: NUCLEAR MEDICINE PET SKULL BASE TO THIGH TECHNIQUE: 11.9 mCi F-18 FDG was injected intravenously. Full-ring PET imaging was performed from the skull base to thigh after  the radiotracer. CT data was obtained and used for attenuation correction and anatomic localization. Fasting blood glucose: 122 mg/dl COMPARISON:  CT chest 01/10/2020 FINDINGS: Mediastinal blood pool activity: SUV max 3.1 Liver activity: SUV max NA NECK: Asymmetric hypermetabolism identified in the posterior right nasopharynx, in the region of the fossa of Rosenmuller. SUV max = 9.0. No mass lesion evident on CT imaging. Uptake in the vocal cords may be related to patient speaking after radiopharmaceutical injection. Focal uptake left thyroid lobe with SUV max = 8.2. Asymmetric enlargement of the left thyroid parenchyma noted by noncontrast CT with potential 3.2 cm left thyroid nodule on image 42/4. Incidental CT findings: none CHEST: 2.7 cm left upper lobe pulmonary mass seen on recent CT is hypermetabolic with SUV max = 7.1. Mottled uptake identified in the mediastinum and both hilar regions without definite hypermetabolic lymphadenopathy. Low level hypermetabolism identified in a left axillary node measuring 7 mm short axis on image 63/4. By report, the patient had recent vaccination in the right upper extremity. While metastatic disease to the left axilla would be somewhat unusual, close follow-up recommended. Incidental CT findings: Coronary artery calcification is evident. Atherosclerotic calcification is noted in the wall of the thoracic aorta. ABDOMEN/PELVIS: No abnormal hypermetabolic activity within the liver, pancreas, adrenal glands, or spleen. No hypermetabolic lymph nodes in the abdomen or pelvis. Incidental CT findings: Multiple tiny bilateral renal lesions of varying size and attenuation are indeterminate but likely reflect a combination of simple and complex cysts. Atherosclerotic  calcification noted in the abdominal aorta. Small umbilical hernia contains only fat. SKELETON: No focal hypermetabolic activity to suggest skeletal metastasis. Incidental CT findings: No worrisome lytic or sclerotic osseous abnormality. IMPRESSION: 1. Left upper lobe pulmonary lesion is hypermetabolic consistent with primary bronchogenic neoplasm. No definite hypermetabolic metastatic disease in the left hilum or mediastinum. 2. I enlarged but mildly hypermetabolic left axillary node. Patient reportedly had recent COVID vaccination in the contralateral upper extremity. This would be somewhat atypical for metastatic lung cancer, but close attention on follow-up recommended. 3. Fairly marked asymmetric hypermetabolism in the posterior right nasopharynx, in the region of the fossa of Rosenmuller. No soft tissue mass lesion evident on CT imaging, but direct visualization recommended to exclude mucosal lesion. 4. Hypermetabolism in the left thyroid lobe apparently associated with thyroid nodule. Recommend thyroid US and biopsy. (Ref: J Am Coll Radiol. 2015 Feb;12(2): 143-50). 5. Multiple bilateral renal lesions of varying size and attenuation. MRI of 03/31/2019 characterized bilateral Bosniak I and II cysts. 6.  Aortic Atherosclerois (ICD10-170.0) Electronically Signed   By: Misty Stanley M.D.   On: 01/26/2020 09:23   DG Chest Port 1 View  Result Date: 02/17/2020 CLINICAL DATA:  Abnormal respirations. Follow-up exam. Status post video bronchoscopy on 02/13/2020. EXAM: PORTABLE CHEST 1 VIEW COMPARISON:  02/15/2020 and older studies. FINDINGS: Cardiac silhouette is normal in size. No mediastinal or hilar masses. There is hazy opacity lateral to the left hilum consistent with the left upper lobe lingular mass noted on the CT from 01/10/2020. Remainder of the lungs is clear. No convincing pleural effusion and no pneumothorax. IMPRESSION: 1. No acute findings, and no significant change from the previous day's study.  Electronically Signed   By: Lajean Manes M.D.   On: 02/17/2020 08:45   DG Chest Port 1 View  Result Date: 02/15/2020 CLINICAL DATA:  Abnormal respirations. EXAM: PORTABLE CHEST 1 VIEW COMPARISON:  02/13/2020 PET-CT 01/25/2020. FINDINGS: Patient rotated to the right. Aortic ectasia noted. Stable cardiomegaly. Stable density noted  over the left mid lung. Reference is made to recent PET-CT. No acute infiltrate. No pleural effusion or pneumothorax. IMPRESSION: 1. Stable density noted the left mid lung. Reference is made to recent PET-CT report. No acute infiltrate. 2.  Stable cardiomegaly. Electronically Signed   By: Marcello Moores  Register   On: 02/15/2020 08:08   DG Chest Port 1 View  Result Date: 02/13/2020 CLINICAL DATA:  64 year old female status post left lung bronchoscopy. EXAM: PORTABLE CHEST 1 VIEW COMPARISON:  Chest radiograph dated 01/04/2020 and CT dated 01/10/2020. FINDINGS: Faint focal nodular area in the left mid lung field as seen previously. Several surgical clips noted over the left mid lung field. No new consolidative changes. There is no pleural effusion or pneumothorax. Mild cardiomegaly. No acute osseous pathology. IMPRESSION: No acute cardiopulmonary process.  No pneumothorax. Electronically Signed   By: Anner Crete M.D.   On: 02/13/2020 16:40   US THYROID  Result Date: 02/06/2020 CLINICAL DATA:  Incidental on PET. Hypermetabolic left-sided thyroid nodule demonstrated on PET-CT performed 01/26/2020 EXAM: THYROID ULTRASOUND TECHNIQUE: Ultrasound examination of the thyroid gland and adjacent soft tissues was performed. COMPARISON:  PET-CT-01/26/2020 FINDINGS: Examination is degraded due to patient body habitus and poor sonographic window. Parenchymal Echotexture: Moderately heterogenous Isthmus: Normal in size measures 0.4 cm in diameter Right lobe: Normal in size measuring 4.0 x 2.2 x 1.7 cm Left lobe: Enlarged measuring 6.3 x 3.9 x 2.5 cm  _________________________________________________________ Estimated total number of nodules >/= 1 cm: 5 Number of spongiform nodules >/=  2 cm not described below (TR1): 0 Number of mixed cystic and solid nodules >/= 1.5 cm not described below (TR2): 0 _________________________________________________________ There is an approximately 0.5 x 0.5 x 0.4 cm predominantly isoechoic nodule within the superior pole the right lobe of the thyroid (labeled 1), which does not meet criteria to recommend percutaneous sampling or continued dedicated follow-up _________________________________________________________ There is an approximately 1.4 x 1.4 x 1.0 cm ill-defined isoechoic nodule/pseudonodule within mid aspect of the right lobe of the thyroid (labeled 2) which does not meet criteria to recommend percutaneous sampling or continued dedicated follow-up. There is an approximately 0.9 x 0.5 x 0.5 cm isoechoic nodule within the inferior pole of the right lobe of the thyroid (labeled 3), which does not meet criteria to recommend percutaneous sampling or continued dedicated follow-up. _________________________________________________________ Nodule # 4: Location: Left; Superior Maximum size: 1.4 cm; Other 2 dimensions: 1.4 x 1.3 cm Composition: solid/almost completely solid (2) Echogenicity: hypoechoic (2) Shape: not taller-than-wide (0) Margins: ill-defined (0) Echogenic foci: none (0) ACR TI-RADS total points: 4. ACR TI-RADS risk category: TR4 (4-6 points). ACR TI-RADS recommendations: *Given size (>/= 1 - 1.4 cm) and appearance, a follow-up ultrasound in 1 year should be considered based on TI-RADS criteria. _________________________________________________________ Nodule # 5: Location: Left; Mid - this nodule likely correlates with the hypermetabolic nodule seen on preceding PET-CT Maximum size: 3.0 cm; Other 2 dimensions: 2.7 x 2.3 cm Composition: solid/almost completely solid (2) Echogenicity: isoechoic (1) Shape: not  taller-than-wide (0) Margins: ill-defined (0) Echogenic foci: none (0) ACR TI-RADS total points: 3. ACR TI-RADS risk category: TR3 (3 points). ACR TI-RADS recommendations: **Given size (>/= 2.5 cm) and appearance, fine needle aspiration of this mildly suspicious nodule should be considered based on TI-RADS criteria. _________________________________________________________ Nodule # 6: Location: Left; Inferior Maximum size: 2.1 cm; Other 2 dimensions: 1.7 x 1.5 cm Composition: solid/almost completely solid (2) Echogenicity: isoechoic (1) Shape: not taller-than-wide (0) Margins: ill-defined (0) Echogenic foci: none (0) ACR TI-RADS  total points: 3. ACR TI-RADS risk category: TR3 (3 points). ACR TI-RADS recommendations: *Given size (>/= 1.5 - 2.4 cm) and appearance, a follow-up ultrasound in 1 year should be considered based on TI-RADS criteria. _________________________________________________________ IMPRESSION: 1. Findings suggestive of multinodular goiter. 2. Nodule #5, likely correlating with the hypermetabolic nodule seen on preceding PET-CT, meets imaging criteria to recommend percutaneous sampling as indicated. 3. Nodules #4 and #6 both meet imaging criteria to recommend a 1 year follow-up. The above is in keeping with the ACR TI-RADS recommendations - J Am Coll Radiol 2017;14:587-595. Electronically Signed   By: Sandi Mariscal M.D.   On: 02/06/2020 15:19   DG C-ARM BRONCHOSCOPY  Result Date: 02/13/2020 C-ARM BRONCHOSCOPY: Fluoroscopy was utilized by the requesting physician.  No radiographic interpretation.    ASSESSMENT AND PLAN: This is a 64 years old African-American female with a stage IA (T1c, N0, M0) non-small cell lung cancer, adenocarcinoma presented with left upper lobe lung nodule diagnosed in June 2021. I had a lengthy discussion with the patient today about her current condition and treatment options. I strongly recommend for the patient to see cardiothoracic surgery first for evaluation of  surgical resection which will be based on her pulmonary function.  We will ask Dr. Elsworth Soho to order pulmonary function test for this patient. If the patient is not a surgical candidate for resection, she probably would benefit from SBRT to the left upper lobe lung nodule. For the thyroid nodule she is currently followed by Dr. Elsworth Soho and expected to have ultrasound-guided fine-needle aspiration of the suspicious nodule. For hypertension she was advised to continue on her blood pressure medication and to monitor it closely at home. I will see the patient back for follow-up visit after her evaluation by surgery and radiation.  She probably would require close monitoring and scan every 6 months after her surgical or radiation procedure. The patient was advised to call if she has any other concerning symptoms in the interval.  The patient voices understanding of current disease status and treatment options and is in agreement with the current care plan.  All questions were answered. The patient knows to call the clinic with any problems, questions or concerns. We can certainly see the patient much sooner if necessary.  The total time spent in the appointment was 30 minutes.  Disclaimer: This note was dictated with voice recognition software. Similar sounding words can inadvertently be transcribed and may not be corrected upon review.

## 2020-02-20 NOTE — Telephone Encounter (Signed)
Pt states she is returning RA's phone call.  I don't see anything documented in pt's chart stating that pt was called, or for what.    RA please advise- pt callback # is [phone].  Thanks!

## 2020-02-20 NOTE — Telephone Encounter (Signed)
I did not call her. Please schedule PFTs for her

## 2020-02-20 NOTE — Telephone Encounter (Signed)
lmtcb for pt. No order for PFT was placed, so ordered PFT. Will need to schedule PFT and covid test when pt calls back.

## 2020-02-21 ENCOUNTER — Ambulatory Visit: Payer: Medicare Other | Admitting: Pulmonary Disease

## 2020-02-21 NOTE — Telephone Encounter (Signed)
LMTCB x2 for pt 

## 2020-02-22 ENCOUNTER — Encounter: Payer: Self-pay | Admitting: Internal Medicine

## 2020-02-22 ENCOUNTER — Ambulatory Visit (INDEPENDENT_AMBULATORY_CARE_PROVIDER_SITE_OTHER): Payer: Medicare Other | Admitting: Internal Medicine

## 2020-02-22 ENCOUNTER — Ambulatory Visit: Payer: Self-pay | Admitting: *Deleted

## 2020-02-22 ENCOUNTER — Other Ambulatory Visit: Payer: Self-pay | Admitting: *Deleted

## 2020-02-22 ENCOUNTER — Telehealth: Payer: Self-pay | Admitting: Internal Medicine

## 2020-02-22 ENCOUNTER — Telehealth: Payer: Medicare Other | Admitting: *Deleted

## 2020-02-22 ENCOUNTER — Ambulatory Visit: Payer: Medicare Other

## 2020-02-22 VITALS — BP 160/91 | HR 72 | Temp 98.4°F

## 2020-02-22 DIAGNOSIS — J4541 Moderate persistent asthma with (acute) exacerbation: Secondary | ICD-10-CM

## 2020-02-22 DIAGNOSIS — I5032 Chronic diastolic (congestive) heart failure: Secondary | ICD-10-CM | POA: Diagnosis not present

## 2020-02-22 DIAGNOSIS — M791 Myalgia, unspecified site: Secondary | ICD-10-CM

## 2020-02-22 DIAGNOSIS — C3492 Malignant neoplasm of unspecified part of left bronchus or lung: Secondary | ICD-10-CM | POA: Diagnosis not present

## 2020-02-22 DIAGNOSIS — N183 Chronic kidney disease, stage 3 unspecified: Secondary | ICD-10-CM | POA: Diagnosis not present

## 2020-02-22 DIAGNOSIS — I10 Essential (primary) hypertension: Secondary | ICD-10-CM

## 2020-02-22 DIAGNOSIS — I130 Hypertensive heart and chronic kidney disease with heart failure and stage 1 through stage 4 chronic kidney disease, or unspecified chronic kidney disease: Secondary | ICD-10-CM

## 2020-02-22 DIAGNOSIS — M7918 Myalgia, other site: Secondary | ICD-10-CM

## 2020-02-22 MED ORDER — CYCLOBENZAPRINE HCL 5 MG PO TABS: 5.0000 mg | ORAL_TABLET | Freq: Every evening | ORAL | 0 refills | Status: AC | PRN

## 2020-02-22 NOTE — Chronic Care Management (AMB) (Signed)
Care Management   Follow Up Note   02/22/2020 Name: Shannon [name] MRN: [id number] DOB: 1956/03/29  Referred by: Shannon Falcon, MD Reason for referral : Care Coordination (PCS, DME)   Shannon [name] is a 64 y.o. year old female who is a primary care patient of Shannon Falcon, MD. The care management team was consulted for assistance with care management and care coordination needs.    Review of patient status, including review of consultants reports, relevant laboratory and other test results, and collaboration with appropriate care team members and the patient's provider was performed as part of comprehensive patient evaluation and provision of chronic care management services.    SDOH (Social Determinants of Health) assessments performed: No See Care Plan activities for detailed interventions related to Shannon [name])     Advanced Directives: See Care Plan and Vynca application for related entries.   Goals Addressed              This Visit's Progress   .  COMPLETED: " I need transportation to a drive through [place] test on 02/10/20" (pt-stated)        [place] (see longitudinal plan of care for additional care plan information)  Current Barriers:  . Transportation.    Clinical Social Work Clinical Goal(s):  [name] Over the next 2 days, patient will work with SW to address concerns related to transportation   Interventions: . Inter-disciplinary care team collaboration (see longitudinal plan of care) . Urgent request for transportation sent to [employer] . Called patient back to inform her that she should be contacted Transportation Services to confirm ride . Requested that Transportation Services follow up with me as well to confirm that request can be accommodated.   . Received confirmation from Transportation Services at 4:44 PM that ride was scheduled for patient. . Called patient to inform her that ride was scheduled and that she will be contacted by  Shannon [name] regarding vendor that is being used and pick up time.    Patient Self Care Activities:  . Self administers medications as prescribed . Attends all scheduled provider appointments . Calls provider office for new concerns or questions . Unable to perform IADLs independently  Initial goal documentation     .  "I have just been diagnosed with lung cancer, I live alone in a 2 story townhouse, I have no local family and before I start treatment I am going to need help in my home and a hospital bed." (pt-stated)        Shannon [name] (see longitudinal plan of care for additional care plan information)  Current Barriers:  . Chronic Disease Management support, education, and care coordination needs related to CAD, HTN, HLD, COPD, CKD Stage 3, Anxiety, and Asthma- patient states she lives in a 2 story town home and due to her pulmonary issues (COPD, Asthma) and recent diagnosis of lung cancer, she can no longer negotiate the stairs, she also reports 3-4 pillow orthopnea   . 02/22/20:  Patient reports during office visit today that hospital bed could not be delivered initially because she was inpatient at the time.  Per patient she "cancelled" the second delivery because she was intending to request order for a lift chair instead.  Patient now stating she would like hospital bed to be ordered again.    Clinical Goal(s) related to CAD, HTN, HLD, COPD, CKD Stage 3, Anxiety, and Asthma:  Over the next 30 days, patient will:  .  Work with the care management team to address educational, disease management, and care coordination needs  . Begin or continue self health monitoring activities as directed today  work with care management team to secure DME and home care assistance . Call provider office for new or worsened signs and symptoms CAD, HTN, HLD, COPD, CKD Stage 3, Anxiety, and Asthma . Call care management team with questions or concerns . Verbalize basic understanding of patient  centered plan of care established today  Interventions related to CAD, HTN, HLD, COPD, CKD Stage 3, Anxiety, and Asthma:  . Faxed completed PCS request form to [name] for review . Informed patient that it takes [employer] business hours to process request . Informed patient that [place] will be contacted on 02/27/20 regarding status of request . Encouraged patient to address need for new order for hospital bed with provider . Messaged Skeet Latch from [place] regarding status of order for hospital bed.  Order has been reactivated and patient should be contacted soon. [name] Collaborated with RNCM, Kelli Churn   . Patient Self Care Activities related to CAD, HTN, HLD, COPD, CKD Stage 3, Anxiety, and Asthma:  . Patient is unable to independently self-manage chronic health conditions  Please see past updates related to this goal by clicking on the "Past Updates" button in the selected goal          Telephone follow up appointment with care management team member scheduled for:02/27/20     Ronn Melena, [place] Worker [place] [phone]

## 2020-02-22 NOTE — Progress Notes (Signed)
CC: Hypertension  HPI:  [name] is a 64 y.o.  female with a past medical history stated below and presents today for Hypertension. Please see problem based assessment and plan for additional details.  Past Medical History:  Diagnosis Date  . Anemia   . Anxiety    HX PANIC ATTACKS  . Arthritis    "starting to; in my hands" (07/09/2015)  . Asthma   . Atrial fibrillation ([place])   . Atrial flutter, paroxysmal ([place])   . Bloated abdomen   . CFS (chronic fatigue syndrome)   . Chewing difficulty   . Chronic asthma with acute exacerbation    "I have chronic asthma all the time; sometimes exacerbations" (07/09/2015)  . Chronic lower back pain   . COPD (chronic obstructive pulmonary disease) ([place])   . Cyst of right kidney    "3 of them; dx'd in ~ 01/2015"  . Dyspnea   . GERD (gastroesophageal reflux disease)   . Heart murmur   . History of blood transfusion    "related to my brain surgery I think"  . History of pulmonary embolism 07/09/2015  . HIV antibody positive ([place])   . HIV disease ([place])   . Hyperlipidemia   . Hypertension   . Leg edema   . Lipodystrophy   . Mild CAD 2013  . Multiple thyroid nodules   . Osteopenia   . Palpitations   . Pneumonia 07/09/2015  . Shingles   . Sleep apnea    "never completed part 2 of study; never wore mask" (07/09/2015)  . Vitamin B 12 deficiency   . Vitamin D deficiency     Current Outpatient Medications on File Prior to Visit  Medication Sig Dispense Refill  . acetaminophen (TYLENOL) 325 MG tablet Take 325 mg by mouth every 6 (six) hours as needed (FOR PAIN).    [name] albuterol (PROVENTIL) (2.5 MG/3ML) 0.083% nebulizer solution Take 3 mLs (2.5 mg total) by nebulization every 6 (six) hours as needed for wheezing or shortness of breath. USE 1 VIAL VIA NEBULIZER EVERY 6 HOURS AS NEEDED FOR WHEEZING OR SHORTNESS OF BREATH    . Ascorbic Acid (VITAMIN C) 1000 MG tablet Take 1,000 mg by mouth every other day.     . b complex vitamins  tablet Take 1 tablet by mouth daily.    . bictegravir-emtricitabine-tenofovir AF (BIKTARVY) 50-200-25 MG TABS tablet Take 1 tablet by mouth daily. 30 tablet 5  . BIOTIN PO Take 1 tablet by mouth daily as needed (for supplementation).     . calcium carbonate (OSCAL) 1500 (600 Ca) MG TABS tablet Take 600 mg of elemental calcium by mouth daily with breakfast.    . Cholecalciferol (SM VITAMIN D3) 100 MCG (4000 UT) CAPS Take 1 capsule (4,000 Units total) by mouth daily. 30 capsule 0  . cloNIDine (CATAPRES - DOSED IN MG/24 HR) 0.3 mg/24hr patch Place 0.3 mg onto the skin once a week.     . cyanocobalamin (,VITAMIN B-12,) 1000 MCG/ML injection ADMINISTER 1 ML(1000 MCG) IN THE MUSCLE EVERY 30 DAYS 1 mL 0  . diltiazem (CARDIZEM CD) 240 MG 24 hr capsule TAKE 1 CAPSULE(240 MG) BY MOUTH DAILY (Patient taking differently: Take 240 mg by mouth daily. ) 90 capsule 3  . fluticasone (FLONASE) 50 MCG/ACT nasal spray Place 2 sprays into both nostrils daily as needed for allergies.    . fluticasone furoate-vilanterol (BREO ELLIPTA) 200-25 MCG/INH AEPB Inhale 1 puff into the lungs daily. 1 each 5  .  furosemide (LASIX) 20 MG tablet Take 1 tablet (20 mg total) by mouth every other day. 30 tablet 2  . MAGNESIUM PO Take 1 tablet by mouth every other day.     . Melatonin 5 MG TABS Take 5 mg by mouth at bedtime as needed (for sleep).    . Misc. Devices (PULSE OXIMETER FOR FINGER) MISC 1 Units by Does not apply route as needed. 1 each 0  . Mouthwashes (BIOTENE DRY MOUTH GENTLE) LIQD Use as directed 1 Dose in the mouth or throat 2 (two) times daily as needed (dry mouth).    . Omega-3 Fatty Acids (FISH OIL) 1000 MG CAPS Take 1,000 mg by mouth daily as needed (pt prefrence).    [name] omeprazole (PRILOSEC) 40 MG capsule Take 1 capsule (40 mg total) by mouth daily as needed (heart burn).    . OXYGEN Inhale 1.5-2 L into the lungs See admin instructions. Uses at bedtime and through the day as needed    . polyethylene glycol (MIRALAX /  GLYCOLAX) 17 g packet Take 17 g by mouth daily as needed for mild constipation.    . predniSONE (DELTASONE) 20 MG tablet Take 2 tablets (40 mg total) by mouth daily with breakfast. 4 tablet 0  . PREVIDENT 5000 SENSITIVE 1.1-5 % PSTE Use daily as directed  1  . PROAIR HFA 108 (90 Base) MCG/ACT inhaler Inhale 2 puffs into the lungs every 4 (four) hours as needed for wheezing or shortness of breath. INHALE 2 PUFFS INTO THE LUNGS EVERY 4 HOURS AS NEEDED FOR WHEEZING OR SHORTNESS OF BREATH    . rivaroxaban (XARELTO) 20 MG TABS tablet Take 1 tablet (20 mg total) by mouth daily with supper. Okay to restart this medication on Thursday, 02/15/2020. 30 tablet   . senna (SENOKOT) 8.6 MG tablet Take 1 tablet by mouth daily as needed for constipation.    [name] spironolactone (ALDACTONE) 25 MG tablet Take 1 tablet (25 mg total) by mouth daily. 90 tablet 1  . spironolactone (ALDACTONE) 25 MG tablet Take 0.5 tablets (12.5 mg total) by mouth daily. 90 tablet 1  . SYRINGE/NEEDLE, DISP, 1 ML (B-D SYRINGE/NEEDLE 1CC/25GX5/8) 25G X 5/8" 1 ML MISC 1 Units by Does not apply route every 30 (thirty) days. 50 each 0  . vitamin A 3 MG (10000 UNITS) capsule Take 10,000 Units by mouth daily.    [name] zinc gluconate 50 MG tablet Take 50 mg by mouth daily.     No current facility-administered medications on file prior to visit.    Family History  Problem Relation Age of Onset  . Asthma Mother   . Heart failure Mother        cardiomyopathy  . Thyroid disease Mother   . Heart disease Mother   . Obesity Mother   . Hypertension Mother   . Heart murmur Sister   . Heart murmur Brother   . Diabetes Sister   . Thyroid disease Sister   . Heart murmur Sister     Social History   Socioeconomic History  . Marital status: Single    Spouse name: Not on file  . Number of children: Not on file  . Years of education: Not on file  . Highest education level: Not on file  Occupational History  . Occupation: Ship broker  Tobacco Use  .  Smoking status: Never Smoker  . Smokeless tobacco: Never Used  Vaping Use  . Vaping Use: Never used  Substance and Sexual Activity  . Alcohol use:  Not Currently    Alcohol/week: 0.0 standard drinks    Comment: Rarely.  . Drug use: No  . Sexual activity: Not Currently    Partners: Male    Comment: declined condoms  Other Topics Concern  . Not on file  Social History Narrative   Originally from [place] then [place] now [place] 2016 approx   Disabled    Rare alcohol, never smoker, no drug use            Social Determinants of Radio broadcast assistant Strain:   . Difficulty of Paying Living Expenses:   Food Insecurity:   . Worried About Charity fundraiser in the Last Year:   . Arboriculturist in the Last Year:   Transportation Needs: No Transportation Needs  . Lack of Transportation (Medical): No  . Lack of Transportation (Non-Medical): No  Physical Activity: Inactive  . Days of Exercise per Week: 0 days  . Minutes of Exercise per Session: 0 min  Stress:   . Feeling of Stress :   Social Connections:   . Frequency of Communication with Friends and Family:   . Frequency of Social Gatherings with Friends and Family:   . Attends Religious Services:   . Active Member of Clubs or Organizations:   . Attends Archivist Meetings:   [name] Marital Status:   Intimate Partner Violence:   . Fear of Current or Ex-Partner:   . Emotionally Abused:   [name] Physically Abused:   . Sexually Abused:     Review of Systems: ROS negative except for what is noted on the assessment and plan.  Vitals:   02/22/20 1014 02/22/20 1116  BP: (!) 180/79 (!) 160/91  Pulse: 77 72  Temp:  98.4 F (36.9 C)  TempSrc:  Oral  SpO2: 98%      Physical Exam: Physical Exam Constitutional:      Appearance: Normal appearance.  HENT:     Head: Normocephalic and atraumatic.  Eyes:     Extraocular Movements: Extraocular movements intact.  Cardiovascular:     Rate and Rhythm: Normal rate.     Pulses:  Normal pulses.     Heart sounds: Normal heart sounds.  Pulmonary:     Effort: Pulmonary effort is normal.     Breath sounds: Normal breath sounds.  Abdominal:     General: Bowel sounds are normal.     Palpations: Abdomen is soft.     Tenderness: There is no abdominal tenderness.  Musculoskeletal:        General: Normal range of motion.     Right shoulder: No tenderness.     Left shoulder: Tenderness (point tenderness in trapezius muscle belly of shoulder) present. No swelling, deformity or effusion. Normal range of motion. Normal strength. Normal pulse.     Cervical back: Normal range of motion.     Right lower leg: Edema (1-2+ pitting) present.     Left lower leg: Edema (1-2+ pitting) present.  Skin:    General: Skin is warm and dry.  Neurological:     Mental Status: She is alert and oriented to person, place, and time. Mental status is at baseline.  Psychiatric:        Mood and Affect: Mood is anxious.      Assessment & Plan:   See Encounters Tab for problem based charting.  Patient discussed with Dr. Bertell Maria, D.O. [facility], PGY-2 Pager: [phone], Phone: [phone] Date 02/22/2020  Time 3:50 PM

## 2020-02-22 NOTE — Progress Notes (Signed)
The proposed treatment discussed in cancer conference 02/22/20 is for discussion purpose only and is not a binding recommendation.  The patient was not physically examined nor present for their treatment options.  Therefore, final treatment plans cannot be decided.

## 2020-02-22 NOTE — Assessment & Plan Note (Addendum)
Patient blood pressure is elevated today at 160/91. She states that she has not taken her blood pressure medications for at least the last day. I counseled her on the importance of good blood pressure control particularly while on glucocorticoid therapy. I reached out to the Pulmonary team regarding her steroid prescription to determine when this medication will be discontinued. If she will be on long term steroid, then she may need additional medications to control her HTN.  Plan:  - Encourage patient to continue taking her blood pressure medications

## 2020-02-22 NOTE — Assessment & Plan Note (Signed)
Patient is s/p bronchoscopy with biopsy. Pathology recently resulted showing Adenocarcinoma. Patient has been informed of this diagnosis prior to this clinic visit. She states that she is concerned regarding the means in which it will be treated. She would like to have it surgically removed and anxious about the prospect of chemo and radiation. Pulmonology is evaluating her respiratory capacity with PFT's and will make a decision about therapy. Patient admits to understanding and is understandably anxious about this situation.   Plan: - Continue work up through pulmonology, pending PFTs. - I am not sure if oncology has been consulted at this point. Considering, she will likely not be a good candidate for lobectomy, I will put in the referral today in order to prevent delay in care.  - Continue prednisone per pulmonology.

## 2020-02-22 NOTE — Patient Instructions (Signed)
Visit Information [employer] will call you regarding delivery of hospital bed. I have asked the [employer] referral team to reach out to you to see if you are eligible for the benefit under your [facility] Medicare plan.  Goals Addressed              This Visit's Progress     Patient Stated   .  "I have just been diagnosed with lung cancer, I live alone in a 2 story townhouse, I have no local family and before I start treatment I am going to need help in my home and a hospital bed." (pt-stated)        [place] (see longitudinal plan of care for additional care plan information)  Current Barriers:  . Chronic Disease Management support, education, and care coordination needs related to CAD, HTN, HLD, COPD, CKD Stage 3, Anxiety, and Asthma- patient states she lives in a 2 story town home and due to her pulmonary issues (COPD, Asthma) and recent diagnosis of lung cancer, she can no longer negotiate the stairs, she also reports 3-4 pillow orthopnea   . 02/22/20:  Patient reports during office visit today that hospital bed could not be delivered initially because she was inpatient at the time.  Per patient she "cancelled" the second delivery because she was intending to request order for a lift chair instead.  Patient now stating she would like hospital bed to be ordered again.    Clinical Goal(s) related to CAD, HTN, HLD, COPD, CKD Stage 3, Anxiety, and Asthma:  Over the next 30 days, patient will:  . Work with the care management team to address educational, disease management, and care coordination needs  . Begin or continue self health monitoring activities as directed today  work with care management team to secure DME and home care assistance . Call provider office for new or worsened signs and symptoms CAD, HTN, HLD, COPD, CKD Stage 3, Anxiety, and Asthma . Call care management team with questions or concerns . Verbalize basic understanding of patient centered plan of care established  today  Interventions related to CAD, HTN, HLD, COPD, CKD Stage 3, Anxiety, and Asthma:  . Faxed completed PCS request form to [facility] for review . Informed patient that it takes [employer] business hours to process request . Informed patient that [place] will be contacted on 02/27/20 regarding status of request . Encouraged patient to address need for new order for hospital bed with provider . Messaged Skeet Latch from [place] regarding status of order for hospital bed.  Order has been reactivated and patient should be contacted soon. [name] Collaborated with RNCM, Kelli Churn . 02/22/20 Spoke with patient advising her that [employer] will be calling about delivery of the hospital bed; provided information on [employer] and with patient's agreement secure e-mail to [email or web address] with request to outreach to patient   . Patient Self Care Activities related to CAD, HTN, HLD, COPD, CKD Stage 3, Anxiety, and Asthma:  . Patient is unable to independently self-manage chronic health conditions  Please see past updates related to this goal by clicking on the "Past Updates" button in the selected goal         The patient verbalized understanding of instructions provided today and declined a print copy of patient instruction materials.   The care management team will reach out to the patient again over the next 30-60 days.   Kelli Churn RN, CCM, [facility] RN Care Manager  [phone]  

## 2020-02-22 NOTE — Telephone Encounter (Signed)
Scheduled per los. Called and left msg. Mailed printout  °

## 2020-02-22 NOTE — Telephone Encounter (Signed)
LMTCB x3 for pt. We have attempted to contact pt several times with no success or call back from pt. Per triage protocol, message will be closed.   

## 2020-02-22 NOTE — Progress Notes (Signed)
Internal Medicine Clinic Resident  I have personally reviewed this encounter including the documentation in this note and/or discussed this patient with the care management provider. I will address any urgent items identified by the care management provider and will communicate my actions to the patient's PCP. I have reviewed the patient's CCM visit with my supervising attending, Dr Dareen Piano.  Harvie Heck, MD  Internal Medicine, PGY-1 02/22/2020

## 2020-02-22 NOTE — Assessment & Plan Note (Deleted)
Patient is s/p bronchoscopy with biopsy. Pathology recently resulted showing Adenocarcinoma. Patient has been informed of this diagnosis prior to this clinic visit. She states that she is concerned regarding the means in which it will be treated. She would like to have it surgically removed and anxious about the prospect of chemo and radiation. Pulmonology is evaluating her respiratory capacity with PFT's and will make a decision about therapy. Patient admits to understanding and is understandably anxious about this situation.   Plan: - Continue work up through pulmonology, pending PFTs. - I am not sure if oncology has been consulted at this point. Considering, she will likely not be a good candidate for lobectomy, I will put in the referral today in order to prevent delay in care.

## 2020-02-22 NOTE — Assessment & Plan Note (Addendum)
Patient presents with a week history of muscle pain in her left superior/posterior shoulder. The pain appears to origninate in the muscle belly of her trapezius. She denies recent trauma to the area. On evaluation, the muscle feels tense and ropey, but she has full passive ROM with no pathologic barriers or restrictions. She is neurovascularly intact in her bilateral upper extremities with no weakness, crepitus, warmth, erythema or rash noted on the surrounding skin.  Patient states that she has not been able to sleep well in her bed since her bronchoscopy and has recently been sleeping on the couch as it is easier to access on the main floor of her house. This could be contributing to her musculoskeletal pain.  Plan: - I counseled her regarding conservative management with ice/heating pads and NSAIDs for pain medication.  - I did caution her to limit NSAID use as it could increase her HTN and worsen her underlying kidney function. - I prescribed her a short course of cyclobenzaprine 5 mg q HS for muscle spasms

## 2020-02-22 NOTE — Patient Instructions (Addendum)
Thank you, Shannon [name] for allowing [place] to provide your care today. Today we discussed No problems updated. .    I have ordered the following labs for you:   Lab Orders     BMP8+Anion Gap   I will call if any are abnormal. All of your labs can be accessed through "My Chart".  I have place a referrals to none.  I have ordered the following tests: none   I have ordered the following medication/changed the following medications:  1. begin Flexeril 5 mg as needed nightly for 5 days 2. Rember to restart your fluid pills (lasik and spironolactone) and you rblood pressure pills. I will call you if we need to make any additional changes to your medications.   Please follow-up in 1 month.  Should you have any questions or concerns please call the internal medicine clinic at [phone].    Marianna Payment, D.O. [facility]   My Chart Access: [email or web address]  If you have not already done so, please get your COVID 19 vaccine  To schedule an appointment for a COVID vaccine choice any of the following: Go to [email or web address]   Go to [email or web address]                  Call [phone]                                     Call [phone] and select Option 2

## 2020-02-22 NOTE — Assessment & Plan Note (Signed)
Patient has CKD stage III with a baseline Cr of 1.3. Most recent CMP from the last week shows an elevated Cr of 1.52. I will order another BMP today to make sure her kidney function returns to normal and her electrolytes are within normal limits from to restarting her diuretic therapy.   Plan: - Repeat BMP today.

## 2020-02-22 NOTE — Patient Instructions (Signed)
Visit Information  Goals Addressed              This Visit's Progress   .  COMPLETED: " I need transportation to a drive through [place] test on 02/10/20" (pt-stated)        [place] (see longitudinal plan of care for additional care plan information)  Current Barriers:  . Transportation.    Clinical Social Work Clinical Goal(s):  [name] Over the next 2 days, patient will work with SW to address concerns related to transportation   Interventions: . Inter-disciplinary care team collaboration (see longitudinal plan of care) . Urgent request for transportation sent to [employer] . Called patient back to inform her that she should be contacted Transportation Services to confirm ride . Requested that Transportation Services follow up with me as well to confirm that request can be accommodated.   . Received confirmation from Transportation Services at 4:44 PM that ride was scheduled for patient. . Called patient to inform her that ride was scheduled and that she will be contacted by [employer] regarding vendor that is being used and pick up time.    Patient Self Care Activities:  . Self administers medications as prescribed . Attends all scheduled provider appointments . Calls provider office for new concerns or questions . Unable to perform IADLs independently  Initial goal documentation     .  "I have just been diagnosed with lung cancer, I live alone in a 2 story townhouse, I have no local family and before I start treatment I am going to need help in my home and a hospital bed." (pt-stated)        [place] (see longitudinal plan of care for additional care plan information)  Current Barriers:  . Chronic Disease Management support, education, and care coordination needs related to CAD, HTN, HLD, COPD, CKD Stage 3, Anxiety, and Asthma- patient states she lives in a 2 story town home and due to her pulmonary issues (COPD, Asthma) and recent  diagnosis of lung cancer, she can no longer negotiate the stairs, she also reports 3-4 pillow orthopnea   . 02/22/20:  Patient reports during office visit today that hospital bed could not be delivered initially because she was inpatient at the time.  Per patient she "cancelled" the second delivery because she was intending to request order for a lift chair instead.  Patient now stating she would like hospital bed to be ordered again.    Clinical Goal(s) related to CAD, HTN, HLD, COPD, CKD Stage 3, Anxiety, and Asthma:  Over the next 30 days, patient will:  . Work with the care management team to address educational, disease management, and care coordination needs  . Begin or continue self health monitoring activities as directed today  work with care management team to secure DME and home care assistance . Call provider office for new or worsened signs and symptoms CAD, HTN, HLD, COPD, CKD Stage 3, Anxiety, and Asthma . Call care management team with questions or concerns . Verbalize basic understanding of patient centered plan of care established today  Interventions related to CAD, HTN, HLD, COPD, CKD Stage 3, Anxiety, and Asthma:  . Faxed completed PCS request form to [facility] for review . Informed patient that it takes [employer] business hours to process request . Informed patient that [place] will be contacted on 02/27/20 regarding status of request . Encouraged patient to address need for new order for hospital bed with provider .  Messaged Skeet Latch from [facility] regarding status of order for hospital bed.  Order has been reactivated and patient should be contacted soon. [name] Collaborated with RNCM, Kelli Churn   . Patient Self Care Activities related to CAD, HTN, HLD, COPD, CKD Stage 3, Anxiety, and Asthma:  . Patient is unable to independently self-manage chronic health conditions  Please see past updates related to this goal by clicking on the "Past Updates" button  in the selected goal         Patient verbalizes understanding of instructions provided today.   Telephone follow up appointment with care management team member scheduled for:02/27/20    Ronn Melena, [place] Coordination Social Worker [place] [phone]

## 2020-02-22 NOTE — Chronic Care Management (AMB) (Signed)
Chronic Care Management   Follow Up Note   02/22/2020 Name: Shannon [name] MRN: [id number] DOB: [birth year]  Referred by: [name], MD Reason for referral : Chronic Care Management (Asthma,HTN,HIV,CKD,CAD,HF)   Shannon [name] is a 64 y.o. year old female who is a primary care patient of [name], MD. The CCM team was consulted for assistance with chronic disease management and care coordination needs.    Review of patient status, including review of consultants reports, relevant laboratory and other test results, and collaboration with appropriate care team members and the patient's provider was performed as part of comprehensive patient evaluation and provision of chronic care management services.    SDOH (Social Determinants of Health) assessments performed: No See Care Plan activities for detailed interventions related to [facility])     Outpatient Encounter Medications as of 02/22/2020  Medication Sig  . acetaminophen (TYLENOL) 325 MG tablet Take 325 mg by mouth every 6 (six) hours as needed (FOR PAIN).  [name] albuterol (PROVENTIL) (2.5 MG/3ML) 0.083% nebulizer solution Take 3 mLs (2.5 mg total) by nebulization every 6 (six) hours as needed for wheezing or shortness of breath. USE 1 VIAL VIA NEBULIZER EVERY 6 HOURS AS NEEDED FOR WHEEZING OR SHORTNESS OF BREATH  . Ascorbic Acid (VITAMIN C) 1000 MG tablet Take 1,000 mg by mouth every other day.   . b complex vitamins tablet Take 1 tablet by mouth daily.  . bictegravir-emtricitabine-tenofovir AF (BIKTARVY) 50-200-25 MG TABS tablet Take 1 tablet by mouth daily.  [name] BIOTIN PO Take 1 tablet by mouth daily as needed (for supplementation).   . calcium carbonate (OSCAL) 1500 (600 Ca) MG TABS tablet Take 600 mg of elemental calcium by mouth daily with breakfast.  . Cholecalciferol (SM VITAMIN D3) 100 MCG (4000 UT) CAPS Take 1 capsule (4,000 Units total) by mouth daily.  . cloNIDine (CATAPRES - DOSED IN MG/24 HR) 0.3 mg/24hr patch Place 0.3 mg  onto the skin once a week.   . cyanocobalamin (,VITAMIN B-12,) 1000 MCG/ML injection ADMINISTER 1 ML(1000 MCG) IN THE MUSCLE EVERY 30 DAYS  . cyclobenzaprine (FLEXERIL) 5 MG tablet Take 1 tablet (5 mg total) by mouth at bedtime as needed for up to 5 days for muscle spasms (Please take 1 tablet at night as needed for muscle pain.).  [name] diltiazem (CARDIZEM CD) 240 MG 24 hr capsule TAKE 1 CAPSULE(240 MG) BY MOUTH DAILY (Patient taking differently: Take 240 mg by mouth daily. )  . fluticasone (FLONASE) 50 MCG/ACT nasal spray Place 2 sprays into both nostrils daily as needed for allergies.  . fluticasone furoate-vilanterol (BREO ELLIPTA) 200-25 MCG/INH AEPB Inhale 1 puff into the lungs daily.  . furosemide (LASIX) 20 MG tablet Take 1 tablet (20 mg total) by mouth every other day.  [name] MAGNESIUM PO Take 1 tablet by mouth every other day.   . Melatonin 5 MG TABS Take 5 mg by mouth at bedtime as needed (for sleep).  . Misc. Devices (PULSE OXIMETER FOR FINGER) MISC 1 Units by Does not apply route as needed.  . Mouthwashes (BIOTENE DRY MOUTH GENTLE) LIQD Use as directed 1 Dose in the mouth or throat 2 (two) times daily as needed (dry mouth).  . Omega-3 Fatty Acids (FISH OIL) 1000 MG CAPS Take 1,000 mg by mouth daily as needed (pt prefrence).  [name] omeprazole (PRILOSEC) 40 MG capsule Take 1 capsule (40 mg total) by mouth daily as needed (heart burn).  . OXYGEN Inhale 1.5-2 L into the lungs See  admin instructions. Uses at bedtime and through the day as needed  . polyethylene glycol (MIRALAX / GLYCOLAX) 17 g packet Take 17 g by mouth daily as needed for mild constipation.  . predniSONE (DELTASONE) 20 MG tablet Take 2 tablets (40 mg total) by mouth daily with breakfast.  . PREVIDENT 5000 SENSITIVE 1.1-5 % PSTE Use daily as directed  . PROAIR HFA 108 (90 Base) MCG/ACT inhaler Inhale 2 puffs into the lungs every 4 (four) hours as needed for wheezing or shortness of breath. INHALE 2 PUFFS INTO THE LUNGS EVERY 4 HOURS AS  NEEDED FOR WHEEZING OR SHORTNESS OF BREATH  . rivaroxaban (XARELTO) 20 MG TABS tablet Take 1 tablet (20 mg total) by mouth daily with supper. Okay to restart this medication on Thursday, 02/15/2020.  . senna (SENOKOT) 8.6 MG tablet Take 1 tablet by mouth daily as needed for constipation.  [name] spironolactone (ALDACTONE) 25 MG tablet Take 1 tablet (25 mg total) by mouth daily.  [name] spironolactone (ALDACTONE) 25 MG tablet Take 0.5 tablets (12.5 mg total) by mouth daily.  . SYRINGE/NEEDLE, DISP, 1 ML (B-D SYRINGE/NEEDLE 1CC/25GX5/8) 25G X 5/8" 1 ML MISC 1 Units by Does not apply route every 30 (thirty) days.  . vitamin A 3 MG (10000 UNITS) capsule Take 10,000 Units by mouth daily.  [name] zinc gluconate 50 MG tablet Take 50 mg by mouth daily.   No facility-administered encounter medications on file as of 02/22/2020.     Objective:   BP Readings from Last 3 Encounters:  02/22/20 (!) 160/91  02/20/20 (!) 160/103  02/17/20 (!) 142/100   Wt Readings from Last 3 Encounters:  02/20/20 231 lb 1.6 oz (104.8 kg)  02/13/20 231 lb 11.3 oz (105.1 kg)  02/06/20 233 lb 6.4 oz (105.9 kg)   Lab Results  Component Value Date   CHOL 249 (H) 11/10/2019   HDL 70 11/10/2019   LDLCALC 164 (H) 11/10/2019   TRIG 53 11/10/2019   CHOLHDL 3.6 11/10/2019   Lab Results  Component Value Date   HIV1RNAQUANT <20 NOT DETECTED 11/10/2019     Goals Addressed              This Visit's Progress     Patient Stated   .  "I have just been diagnosed with lung cancer, I live alone in a 2 story townhouse, I have no local family and before I start treatment I am going to need help in my home and a hospital bed." (pt-stated)        [place] (see longitudinal plan of care for additional care plan information)  Current Barriers:  . Chronic Disease Management support, education, and care coordination needs related to CAD, HTN, HLD, COPD, CKD Stage 3, Anxiety, and Asthma- patient states she lives in a 2 story town home  and due to her pulmonary issues (COPD, Asthma) and recent diagnosis of lung cancer, she can no longer negotiate the stairs, she also reports 3-4 pillow orthopnea   . 02/22/20:  Patient reports during office visit today that hospital bed could not be delivered initially because she was inpatient at the time.  Per patient she "cancelled" the second delivery because she was intending to request order for a lift chair instead.  Patient now stating she would like hospital bed to be ordered again.    Clinical Goal(s) related to CAD, HTN, HLD, COPD, CKD Stage 3, Anxiety, and Asthma:  Over the next 30 days, patient will:  . Work with the  care management team to address educational, disease management, and care coordination needs  . Begin or continue self health monitoring activities as directed today  work with care management team to secure DME and home care assistance . Call provider office for new or worsened signs and symptoms CAD, HTN, HLD, COPD, CKD Stage 3, Anxiety, and Asthma . Call care management team with questions or concerns . Verbalize basic understanding of patient centered plan of care established today  Interventions related to CAD, HTN, HLD, COPD, CKD Stage 3, Anxiety, and Asthma:  . Faxed completed PCS request form to [facility] for review . Informed patient that it takes [employer] business hours to process request . Informed patient that [place] will be contacted on 02/27/20 regarding status of request . Encouraged patient to address need for new order for hospital bed with provider . Messaged Skeet Latch from [place] regarding status of order for hospital bed.  Order has been reactivated and patient should be contacted soon. [name] Collaborated with RNCM, Kelli Churn . 02/22/20 Spoke with patient advising her that [employer] will be calling about delivery of the hospital bed; provided information on [employer] and with patient's agreement secure e-mail to [email or web address]  with request to outreach to patient   . Patient Self Care Activities related to CAD, HTN, HLD, COPD, CKD Stage 3, Anxiety, and Asthma:  . Patient is unable to independently self-manage chronic health conditions  Please see past updates related to this goal by clicking on the "Past Updates" button in the selected goal          Plan:   The care management team will reach out to the patient again over the next 30-60 days.    Kelli Churn RN, CCM, [facility] RN Care Manager [phone]

## 2020-02-22 NOTE — Assessment & Plan Note (Signed)
Patient presents today with mild signs of hypervolemia. She has shortness of breath and lower extremity edema. She states that she has not been taking her diuretics as it makes her feel dehydrated. I counseled her on restarting her diuretics today to avoid complications form her CHF. She agreed to this plan.  Plan: - Restart Lasix 20 mg and spironolactone 25 mg.  - Will order BMP today to assess Kidney function.

## 2020-02-23 LAB — BMP8+ANION GAP
Anion Gap: 13 mmol/L (ref 10.0–18.0)
BUN/Creatinine Ratio: 15 (ref 12–28)
BUN: 18 mg/dL (ref 8–27)
CO2: 29 mmol/L (ref 20–29)
Calcium: 10.3 mg/dL (ref 8.7–10.3)
Chloride: 100 mmol/L (ref 96–106)
Creatinine, Ser: 1.17 mg/dL — ABNORMAL HIGH (ref 0.57–1.00)
GFR calc Af Amer: 57 mL/min/{1.73_m2} — ABNORMAL LOW (ref >59)
GFR calc non Af Amer: 49 mL/min/{1.73_m2} — ABNORMAL LOW (ref >59)
Glucose: 106 mg/dL — ABNORMAL HIGH (ref 65–99)
Potassium: 4.6 mmol/L (ref 3.5–5.2)
Sodium: 142 mmol/L (ref 134–144)

## 2020-02-23 MED ORDER — CYANOCOBALAMIN 1000 MCG/ML IJ SOLN: 1000.0000 ug | INTRAMUSCULAR | 1 refills | Status: DC

## 2020-02-23 NOTE — Progress Notes (Signed)
Internal Medicine Clinic Attending  I saw and evaluated the patient.  I personally confirmed the key portions of the history and exam documented by Dr. Coe and I reviewed pertinent patient test results.  The assessment, diagnosis, and plan were formulated together and I agree with the documentation in the resident's note.    

## 2020-02-23 NOTE — Addendum Note (Signed)
Addended by: Lawerance Cruel on: 02/23/2020 06:51 AM   Modules accepted: Level of Service

## 2020-02-23 NOTE — Addendum Note (Signed)
Addended by: Gilles Chiquito B on: 02/23/2020 02:33 PM   Modules accepted: Level of Service

## 2020-02-23 NOTE — Progress Notes (Signed)
Internal Medicine Clinic Attending  CCM services provided by the care management provider and their documentation were discussed with Dr. Marva Panda. We reviewed the pertinent findings, urgent action items addressed by the resident and non-urgent items to be addressed by the PCP.  I agree with the assessment, diagnosis, and plan of care documented in the CCM and resident's note.  Aldine Contes, MD 02/23/2020

## 2020-02-26 ENCOUNTER — Other Ambulatory Visit: Payer: Self-pay | Admitting: Internal Medicine

## 2020-02-26 DIAGNOSIS — J019 Acute sinusitis, unspecified: Secondary | ICD-10-CM

## 2020-02-27 ENCOUNTER — Ambulatory Visit: Payer: Medicare Other

## 2020-02-27 DIAGNOSIS — I10 Essential (primary) hypertension: Secondary | ICD-10-CM

## 2020-02-27 DIAGNOSIS — I5032 Chronic diastolic (congestive) heart failure: Secondary | ICD-10-CM

## 2020-02-27 DIAGNOSIS — N183 Chronic kidney disease, stage 3 unspecified: Secondary | ICD-10-CM

## 2020-02-27 NOTE — Patient Instructions (Addendum)
Visit Information  Goals Addressed              This Visit's Progress     "I have just been diagnosed with lung cancer, I live alone in a 2 story townhouse, I have no local family and before I start treatment I am going to need help in my home and a hospital bed." (pt-stated)        [place] (see longitudinal plan of care for additional care plan information)  Current Barriers:   Chronic Disease Management support, education, and care coordination needs related to CAD, HTN, HLD, COPD, CKD Stage 3, Anxiety, and Asthma- patient states she lives in a 2 story town home and due to her pulmonary issues (COPD, Asthma) and recent diagnosis of lung cancer, she can no longer negotiate the stairs, she also reports 3-4 pillow orthopnea    02/22/20:  Patient reports during office visit today that hospital bed could not be delivered initially because she was inpatient at the time.  Per patient she "cancelled" the second delivery because she was intending to request order for a lift chair instead.  Patient now stating she would like hospital bed to be ordered again.    Clinical Goal(s) related to CAD, HTN, HLD, COPD, CKD Stage 3, Anxiety, and Asthma:  Over the next 30 days, patient will:   Work with the care management team to address educational, disease management, and care coordination needs   Begin or continue self health monitoring activities as directed today  work with care management team to secure DME and home care assistance  Call provider office for new or worsened signs and symptoms CAD, HTN, HLD, COPD, CKD Stage 3, Anxiety, and Asthma  Call care management team with questions or concerns  Verbalize basic understanding of patient centered plan of care established today  Interventions related to CAD, HTN, HLD, COPD, CKD Stage 3, Anxiety, and Asthma:   [name] regarding status of request for PCS.  Request has been processed and patient can be scheduled for  assessment  Talked to patient.  She was contacted by [name] and assessment was scheduled      Patient Self Care Activities related to CAD, HTN, HLD, COPD, CKD Stage 3, Anxiety, and Asthma:   Patient is unable to independently self-manage chronic health conditions  Please see past updates related to this goal by clicking on the "Past Updates" button in the selected goal          Will follow up with patient next week.        Ronn Melena, [place] Coordination Social Worker [place] [phone]

## 2020-02-27 NOTE — Chronic Care Management (AMB) (Addendum)
Care Management   Follow Up Note   02/27/2020 Name: Shannon [name] MRN: [id number] DOB: [birth year]  Referred by: [name], MD Reason for referral : Care Coordination (PCS, DME)   Shannon [name] is a 64 y.o. year old female who is a primary care patient of [name], MD. The care management team was consulted for assistance with care management and care coordination needs.    Review of patient status, including review of consultants reports, relevant laboratory and other test results, and collaboration with appropriate care team members and the patient's provider was performed as part of comprehensive patient evaluation and provision of chronic care management services.    SDOH (Social Determinants of Health) assessments performed: No See Care Plan activities for detailed interventions related to [facility])     Advanced Directives: See Care Plan and [employer] application for related entries.   Goals Addressed              This Visit's Progress   .  "I have just been diagnosed with lung cancer, I live alone in a 2 story townhouse, I have no local family and before I start treatment I am going to need help in my home and a hospital bed." (pt-stated)        [place] (see longitudinal plan of care for additional care plan information)  Current Barriers:  . Chronic Disease Management support, education, and care coordination needs related to CAD, HTN, HLD, COPD, CKD Stage 3, Anxiety, and Asthma- patient states she lives in a 2 story town home and due to her pulmonary issues (COPD, Asthma) and recent diagnosis of lung cancer, she can no longer negotiate the stairs, she also reports 3-4 pillow orthopnea   . 02/22/20:  Patient reports during office visit today that hospital bed could not be delivered initially because she was inpatient at the time.  Per patient she "cancelled" the second delivery because she was intending to request order for a lift chair instead.  Patient now  stating she would like hospital bed to be ordered again.    Clinical Goal(s) related to CAD, HTN, HLD, COPD, CKD Stage 3, Anxiety, and Asthma:  Over the next 30 days, patient will:  . Work with the care management team to address educational, disease management, and care coordination needs  . Begin or continue self health monitoring activities as directed today  work with care management team to secure DME and home care assistance . Call provider office for new or worsened signs and symptoms CAD, HTN, HLD, COPD, CKD Stage 3, Anxiety, and Asthma . Call care management team with questions or concerns . Verbalize basic understanding of patient centered plan of care established today  Interventions related to CAD, HTN, HLD, COPD, CKD Stage 3, Anxiety, and Asthma:  . [place] regarding status of request for PCS.  Request has been processed and patient can be scheduled for assessment . Talked to patient.  She was contacted by [facility] and assessment was scheduled  .   [name] Patient Self Care Activities related to CAD, HTN, HLD, COPD, CKD Stage 3, Anxiety, and Asthma:  . Patient is unable to independently self-manage chronic health conditions  Please see past updates related to this goal by clicking on the "Past Updates" button in the selected goal          Will follow up with patient next week.    Geographical information systems officer, [facility]  [place] [phone]

## 2020-02-28 ENCOUNTER — Other Ambulatory Visit: Payer: Medicare Other

## 2020-02-29 ENCOUNTER — Telehealth: Payer: Medicare Other

## 2020-02-29 ENCOUNTER — Other Ambulatory Visit: Payer: Self-pay | Admitting: Cardiovascular Disease

## 2020-03-01 ENCOUNTER — Telehealth: Payer: Medicare Other

## 2020-03-01 ENCOUNTER — Institutional Professional Consult (permissible substitution) (INDEPENDENT_AMBULATORY_CARE_PROVIDER_SITE_OTHER): Payer: Medicare Other | Admitting: Thoracic Surgery (Cardiothoracic Vascular Surgery)

## 2020-03-01 ENCOUNTER — Other Ambulatory Visit: Payer: Self-pay

## 2020-03-01 ENCOUNTER — Telehealth: Payer: Self-pay | Admitting: Internal Medicine

## 2020-03-01 ENCOUNTER — Other Ambulatory Visit: Payer: Self-pay | Admitting: Thoracic Surgery (Cardiothoracic Vascular Surgery)

## 2020-03-01 ENCOUNTER — Encounter: Payer: Self-pay | Admitting: Thoracic Surgery (Cardiothoracic Vascular Surgery)

## 2020-03-01 VITALS — BP 133/84 | HR 91 | Temp 98.1°F | Resp 24 | Ht 63.5 in | Wt 229.0 lb

## 2020-03-01 DIAGNOSIS — C3492 Malignant neoplasm of unspecified part of left bronchus or lung: Secondary | ICD-10-CM

## 2020-03-01 DIAGNOSIS — R911 Solitary pulmonary nodule: Secondary | ICD-10-CM

## 2020-03-01 DIAGNOSIS — J4541 Moderate persistent asthma with (acute) exacerbation: Secondary | ICD-10-CM

## 2020-03-01 NOTE — Telephone Encounter (Signed)
-----   Message from Rigoberto Noel, MD sent at 02/28/2020  5:20 PM EDT ----- Regarding: RE: Prednisone info Ben,  I did not intend for her to be on long-term prednisone .  Would give her 2 weeks after hospital discharge and then taper to off if she has improved clinically  RA ----- Message ----- From: Melvenia Needles, NP Sent: 02/23/2020   1:08 PM EDT To: Rigoberto Noel, MD, Marianna Payment, MD Subject: RE: Prednisone info                            According to Dr. Elsworth Soho  note when he saw her on 02/05/20 she was to finish prednisone course, I do not see any mention of staying on prednisone .  I copied Davonna Belling to make sure he does not want her on prednisone .   Thanks  Tammy  ----- Message ----- From: Marianna Payment, MD Sent: 02/22/2020  10:57 AM EDT To: Melvenia Needles, NP Subject: Prednisone info                                [place]erton Border,  I just saw Shannon [name] in the clinic today. It looks like she is currently on prednisone. Mrs. Keiffer was not sure how ling she would be taking this medication, so I just wanted to check with you. Will she be on this longer term or do you anticipate a stop date in the near future.    Best regards,  Marianna Payment

## 2020-03-01 NOTE — Progress Notes (Signed)
[address]       [place],Shannon [name]             [phone]                    Shannon [name] [place] Medical Record #[id number] Date of Birth: 18-Mar-1956  Referring: Curt Bears, MD Primary Care: [name], MD Primary Cardiologist: Skeet Latch, MD  Chief Complaint:    Chief Complaint  Patient presents with  . Lung Lesion    Surgical eval, PET Scan 01/26/20, Chest CT 01/10/20, BX 02/13/20    History of Present Illness:    Shannon [name] 64 y.o. female presents for surgical evaluation of a left upper lobe pulmonary nodule.  In 2018 she underwent a CT chest which showed chronic scarring and the left upper lobe which was stable until May 2021.  At that point cross-sectional imaging showed the left upper lobe nodule had grown to 2.7 cm.  She subsequently underwent a PET/CT which showed that the mass had an SUV uptake of 7.1.  She has a long history of pretty severe asthma, and has had multiple admissions for exacerbations.  She currently uses supplemental O2 at night.  Last month she underwent a navigational bronchoscopy with biopsy of the mass and had a difficult extubation course.  She eventually was extubated in the PACU but was admitted to the ICU for observation for a total of 3 days.  Once transferred to the floor she did well but discharge recommendations were for skilled nursing facility but the patient refused.  Today she remains short of breath and complains of some chest pain but this is chronic in nature.  She is quite emotional today as well.    Smoking Hx: Lifelong non-smoker   Zubrod Score: At the time of surgery this patient's most appropriate activity status/level should be described as: []     0    Normal activity, no symptoms [x]     1    Restricted in physical strenuous activity but ambulatory, able to do out light work []     2    Ambulatory and capable of self care, unable to do work activities, up and about               >50 %  of waking hours                              []     3    Only limited self care, in bed greater than 50% of waking hours []     4    Completely disabled, no self care, confined to bed or chair []     5    Moribund   Past Medical History:  Diagnosis Date  . Anemia   . Anxiety    HX PANIC ATTACKS  . Arthritis    "starting to; in my hands" (07/09/2015)  . Asthma   . Atrial fibrillation ([place])   . Atrial flutter, paroxysmal ([place])   . Bloated abdomen   . CFS (chronic fatigue syndrome)   . Chewing difficulty   . Chronic asthma with acute exacerbation    "I have chronic asthma all the time; sometimes exacerbations" (07/09/2015)  . Chronic lower back pain   . COPD (chronic obstructive pulmonary disease) ([place])   . Cyst of right kidney    "3 of them; dx'd in ~ 01/2015"  .  Dyspnea   . GERD (gastroesophageal reflux disease)   . Heart murmur   . History of blood transfusion    "related to my brain surgery I think"  . History of pulmonary embolism 07/09/2015  . HIV antibody positive ([place])   . HIV disease ([place])   . Hyperlipidemia   . Hypertension   . Leg edema   . Lipodystrophy   . Mild CAD 2013  . Multiple thyroid nodules   . Osteopenia   . Palpitations   . Pneumonia 07/09/2015  . Shingles   . Sleep apnea    "never completed part 2 of study; never wore mask" (07/09/2015)  . Vitamin B 12 deficiency   . Vitamin D deficiency     Past Surgical History:  Procedure Laterality Date  . ABDOMINAL HYSTERECTOMY     "robotic laparosopic"  . BRAIN SURGERY  1974   "brain tumor; benign; on top of my brain; got a plate in there"  . BRAIN SURGERY     age 47- -"Tumor pushing my skullout"  . BRONCHIAL BIOPSY  02/13/2020   Procedure: BRONCHIAL BIOPSIES;  Surgeon: [name], MD;  Location: [facility] ENDOSCOPY;  Service: Pulmonary;;  . BRONCHIAL BRUSHINGS  02/13/2020   Procedure: BRONCHIAL BRUSHINGS;  Surgeon: [name], MD;  Location: [facility] ENDOSCOPY;  Service: Pulmonary;;  . BRONCHIAL NEEDLE  ASPIRATION BIOPSY  02/13/2020   Procedure: BRONCHIAL NEEDLE ASPIRATION BIOPSIES;  Surgeon: [name], MD;  Location: [facility] ENDOSCOPY;  Service: Pulmonary;;  . CARDIAC CATHETERIZATION    . FIDUCIAL MARKER PLACEMENT  02/13/2020   Procedure: FIDUCIAL MARKER PLACEMENT;  Surgeon: [name], MD;  Location: [facility] ENDOSCOPY;  Service: Pulmonary;;  . TONSILLECTOMY AND ADENOIDECTOMY    . VIDEO BRONCHOSCOPY WITH ENDOBRONCHIAL NAVIGATION N/A 02/13/2020   Procedure: VIDEO BRONCHOSCOPY WITH ENDOBRONCHIAL NAVIGATION;  Surgeon: [name], MD;  Location: [facility] ENDOSCOPY;  Service: Pulmonary;  Laterality: N/A;    Family History  Problem Relation Age of Onset  . Asthma Mother   . Heart failure Mother        cardiomyopathy  . Thyroid disease Mother   . Heart disease Mother   . Obesity Mother   . Hypertension Mother   . Heart murmur Sister   . Heart murmur Brother   . Diabetes Sister   . Thyroid disease Sister   . Heart murmur Sister      Social History   Tobacco Use  Smoking Status Never Smoker  Smokeless Tobacco Never Used    Social History   Substance and Sexual Activity  Alcohol Use Not Currently  . Alcohol/week: 0.0 standard drinks   Comment: Rarely.     Allergies  Allergen Reactions  . Tree Extract Swelling and Other (See Comments)    Swelling to eyes  . Augmentin [Amoxicillin-Pot Clavulanate] Other (See Comments)    Headache, dizzy  . Lisinopril Cough    Face/throat swelling  . Ciprofloxacin Hives    Current Outpatient Medications  Medication Sig Dispense Refill  . acetaminophen (TYLENOL) 325 MG tablet Take 325 mg by mouth every 6 (six) hours as needed (FOR PAIN).    [name] albuterol (PROVENTIL) (2.5 MG/3ML) 0.083% nebulizer solution Take 3 mLs (2.5 mg total) by nebulization every 6 (six) hours as needed for wheezing or shortness of breath. USE 1 VIAL VIA NEBULIZER EVERY 6 HOURS AS NEEDED FOR WHEEZING OR SHORTNESS OF BREATH    . Ascorbic Acid (VITAMIN C) 1000 MG tablet  Take 1,000 mg by mouth every other  day.     . b complex vitamins tablet Take 1 tablet by mouth daily.    . bictegravir-emtricitabine-tenofovir AF (BIKTARVY) 50-200-25 MG TABS tablet Take 1 tablet by mouth daily. 30 tablet 5  . BIOTIN PO Take 1 tablet by mouth daily as needed (for supplementation).     . calcium carbonate (OSCAL) 1500 (600 Ca) MG TABS tablet Take 600 mg of elemental calcium by mouth daily with breakfast.    . Cholecalciferol (SM VITAMIN D3) 100 MCG (4000 UT) CAPS Take 1 capsule (4,000 Units total) by mouth daily. 30 capsule 0  . cloNIDine (CATAPRES - DOSED IN MG/24 HR) 0.3 mg/24hr patch Place 0.3 mg onto the skin once a week.     . cyanocobalamin (,VITAMIN B-12,) 1000 MCG/ML injection ADMINISTER 1 ML(1000 MCG) IN THE MUSCLE EVERY 30 DAYS 1 mL 0  . cyanocobalamin (,VITAMIN B-12,) 1000 MCG/ML injection Inject 1 mL (1,000 mcg total) into the muscle every 30 (thirty) days. ADMINISTER 1 ML(1000 MCG) IN THE MUSCLE EVERY 30 DAYS 1 mL 1  . diltiazem (CARDIZEM CD) 240 MG 24 hr capsule TAKE 1 CAPSULE(240 MG) BY MOUTH DAILY (Patient taking differently: Take 240 mg by mouth daily. ) 90 capsule 3  . fluticasone (FLONASE) 50 MCG/ACT nasal spray Place 2 sprays into both nostrils daily as needed for allergies.    . fluticasone furoate-vilanterol (BREO ELLIPTA) 200-25 MCG/INH AEPB Inhale 1 puff into the lungs daily. 1 each 5  . furosemide (LASIX) 20 MG tablet Take 1 tablet (20 mg total) by mouth every other day. 30 tablet 2  . MAGNESIUM PO Take 1 tablet by mouth every other day.     . Melatonin 5 MG TABS Take 5 mg by mouth at bedtime as needed (for sleep).    . Misc. Devices (PULSE OXIMETER FOR FINGER) MISC 1 Units by Does not apply route as needed. 1 each 0  . Mouthwashes (BIOTENE DRY MOUTH GENTLE) LIQD Use as directed 1 Dose in the mouth or throat 2 (two) times daily as needed (dry mouth).    . Omega-3 Fatty Acids (FISH OIL) 1000 MG CAPS Take 1,000 mg by mouth daily as needed (pt prefrence).    [name]  omeprazole (PRILOSEC) 40 MG capsule Take 1 capsule (40 mg total) by mouth daily as needed (heart burn).    . OXYGEN Inhale 1.5-2 L into the lungs See admin instructions. Uses at bedtime and through the day as needed    . polyethylene glycol (MIRALAX / GLYCOLAX) 17 g packet Take 17 g by mouth daily as needed for mild constipation.    [name] PREVIDENT 5000 SENSITIVE 1.1-5 % PSTE Use daily as directed  1  . PROAIR HFA 108 (90 Base) MCG/ACT inhaler Inhale 2 puffs into the lungs every 4 (four) hours as needed for wheezing or shortness of breath. INHALE 2 PUFFS INTO THE LUNGS EVERY 4 HOURS AS NEEDED FOR WHEEZING OR SHORTNESS OF BREATH    . rivaroxaban (XARELTO) 20 MG TABS tablet Take 1 tablet (20 mg total) by mouth daily with supper. Okay to restart this medication on Thursday, 02/15/2020. 30 tablet   . senna (SENOKOT) 8.6 MG tablet Take 1 tablet by mouth daily as needed for constipation.    [name] spironolactone (ALDACTONE) 25 MG tablet Take 0.5 tablets (12.5 mg total) by mouth daily. 90 tablet 1  . SYRINGE/NEEDLE, DISP, 1 ML (B-D SYRINGE/NEEDLE 1CC/25GX5/8) 25G X 5/8" 1 ML MISC 1 Units by Does not apply route every 30 (thirty) days. 50 each  0  . vitamin A 3 MG (10000 UNITS) capsule Take 10,000 Units by mouth daily.    [name] zinc gluconate 50 MG tablet Take 50 mg by mouth daily.    [name] spironolactone (ALDACTONE) 25 MG tablet Take 1 tablet (25 mg total) by mouth daily. 90 tablet 1   No current facility-administered medications for this visit.    Review of Systems  Constitutional: Positive for malaise/fatigue. Negative for chills and fever.  HENT: Positive for sore throat.   Respiratory: Positive for cough, sputum production and shortness of breath.   Cardiovascular: Positive for chest pain and palpitations.  Gastrointestinal: Negative.   Musculoskeletal: Negative.   Neurological: Positive for dizziness and weakness.  Psychiatric/Behavioral: Positive for depression and memory loss. The patient is nervous/anxious.        PHYSICAL EXAMINATION: BP 133/84 (BP Location: Right Arm, Patient Position: Sitting, Cuff Size: Normal)   Pulse 91 Comment: RA  Temp 98.1 F (36.7 C) (Temporal)   Resp (!) 24   Ht 5' 3.5" (1.613 m)   Wt 229 lb (103.9 kg)   SpO2 91% Comment: RA  BMI 39.93 kg/m  Physical Exam Constitutional:      Appearance: Normal appearance.  HENT:     Head: Normocephalic and atraumatic.  Eyes:     Conjunctiva/sclera: Conjunctivae normal.  Neck:     Comments: Left thyroid fullness Cardiovascular:     Rate and Rhythm: Normal rate.  Pulmonary:     Effort: Pulmonary effort is normal. No respiratory distress.     Breath sounds: Wheezing present.  Abdominal:     General: Abdomen is flat. There is no distension.  Musculoskeletal:        General: Normal range of motion.     Cervical back: Normal range of motion.  Skin:    General: Skin is warm and dry.  Neurological:     General: No focal deficit present.     Mental Status: She is alert and oriented to person, place, and time.     Diagnostic Studies & Laboratory data:     Recent Radiology Findings:   DG Chest Port 1 View  Result Date: 02/17/2020 CLINICAL DATA:  Abnormal respirations. Follow-up exam. Status post video bronchoscopy on 02/13/2020. EXAM: PORTABLE CHEST 1 VIEW COMPARISON:  02/15/2020 and older studies. FINDINGS: Cardiac silhouette is normal in size. No mediastinal or hilar masses. There is hazy opacity lateral to the left hilum consistent with the left upper lobe lingular mass noted on the CT from 01/10/2020. Remainder of the lungs is clear. No convincing pleural effusion and no pneumothorax. IMPRESSION: 1. No acute findings, and no significant change from the previous day's study. Electronically Signed   By: Lajean Manes M.D.   On: 02/17/2020 08:45   DG Chest Port 1 View  Result Date: 02/15/2020 CLINICAL DATA:  Abnormal respirations. EXAM: PORTABLE CHEST 1 VIEW COMPARISON:  02/13/2020 PET-CT 01/25/2020. FINDINGS: Patient  rotated to the right. Aortic ectasia noted. Stable cardiomegaly. Stable density noted over the left mid lung. Reference is made to recent PET-CT. No acute infiltrate. No pleural effusion or pneumothorax. IMPRESSION: 1. Stable density noted the left mid lung. Reference is made to recent PET-CT report. No acute infiltrate. 2.  Stable cardiomegaly. Electronically Signed   By: Marcello Moores  Register   On: 02/15/2020 08:08   DG Chest Port 1 View  Result Date: 02/13/2020 CLINICAL DATA:  64 year old female status post left lung bronchoscopy. EXAM: PORTABLE CHEST 1 VIEW COMPARISON:  Chest radiograph dated 01/04/2020 and CT  dated 01/10/2020. FINDINGS: Faint focal nodular area in the left mid lung field as seen previously. Several surgical clips noted over the left mid lung field. No new consolidative changes. There is no pleural effusion or pneumothorax. Mild cardiomegaly. No acute osseous pathology. IMPRESSION: No acute cardiopulmonary process.  No pneumothorax. Electronically Signed   By: Anner Crete M.D.   On: 02/13/2020 16:40   US THYROID  Result Date: 02/06/2020 CLINICAL DATA:  Incidental on PET. Hypermetabolic left-sided thyroid nodule demonstrated on PET-CT performed 01/26/2020 EXAM: THYROID ULTRASOUND TECHNIQUE: Ultrasound examination of the thyroid gland and adjacent soft tissues was performed. COMPARISON:  PET-CT-01/26/2020 FINDINGS: Examination is degraded due to patient body habitus and poor sonographic window. Parenchymal Echotexture: Moderately heterogenous Isthmus: Normal in size measures 0.4 cm in diameter Right lobe: Normal in size measuring 4.0 x 2.2 x 1.7 cm Left lobe: Enlarged measuring 6.3 x 3.9 x 2.5 cm _________________________________________________________ Estimated total number of nodules >/= 1 cm: 5 Number of spongiform nodules >/=  2 cm not described below (TR1): 0 Number of mixed cystic and solid nodules >/= 1.5 cm not described below (TR2): 0  _________________________________________________________ There is an approximately 0.5 x 0.5 x 0.4 cm predominantly isoechoic nodule within the superior pole the right lobe of the thyroid (labeled 1), which does not meet criteria to recommend percutaneous sampling or continued dedicated follow-up _________________________________________________________ There is an approximately 1.4 x 1.4 x 1.0 cm ill-defined isoechoic nodule/pseudonodule within mid aspect of the right lobe of the thyroid (labeled 2) which does not meet criteria to recommend percutaneous sampling or continued dedicated follow-up. There is an approximately 0.9 x 0.5 x 0.5 cm isoechoic nodule within the inferior pole of the right lobe of the thyroid (labeled 3), which does not meet criteria to recommend percutaneous sampling or continued dedicated follow-up. _________________________________________________________ Nodule # 4: Location: Left; Superior Maximum size: 1.4 cm; Other 2 dimensions: 1.4 x 1.3 cm Composition: solid/almost completely solid (2) Echogenicity: hypoechoic (2) Shape: not taller-than-wide (0) Margins: ill-defined (0) Echogenic foci: none (0) ACR TI-RADS total points: 4. ACR TI-RADS risk category: TR4 (4-6 points). ACR TI-RADS recommendations: *Given size (>/= 1 - 1.4 cm) and appearance, a follow-up ultrasound in 1 year should be considered based on TI-RADS criteria. _________________________________________________________ Nodule # 5: Location: Left; Mid - this nodule likely correlates with the hypermetabolic nodule seen on preceding PET-CT Maximum size: 3.0 cm; Other 2 dimensions: 2.7 x 2.3 cm Composition: solid/almost completely solid (2) Echogenicity: isoechoic (1) Shape: not taller-than-wide (0) Margins: ill-defined (0) Echogenic foci: none (0) ACR TI-RADS total points: 3. ACR TI-RADS risk category: TR3 (3 points). ACR TI-RADS recommendations: **Given size (>/= 2.5 cm) and appearance, fine needle aspiration of this mildly  suspicious nodule should be considered based on TI-RADS criteria. _________________________________________________________ Nodule # 6: Location: Left; Inferior Maximum size: 2.1 cm; Other 2 dimensions: 1.7 x 1.5 cm Composition: solid/almost completely solid (2) Echogenicity: isoechoic (1) Shape: not taller-than-wide (0) Margins: ill-defined (0) Echogenic foci: none (0) ACR TI-RADS total points: 3. ACR TI-RADS risk category: TR3 (3 points). ACR TI-RADS recommendations: *Given size (>/= 1.5 - 2.4 cm) and appearance, a follow-up ultrasound in 1 year should be considered based on TI-RADS criteria. _________________________________________________________ IMPRESSION: 1. Findings suggestive of multinodular goiter. 2. Nodule #5, likely correlating with the hypermetabolic nodule seen on preceding PET-CT, meets imaging criteria to recommend percutaneous sampling as indicated. 3. Nodules #4 and #6 both meet imaging criteria to recommend a 1 year follow-up. The above is in keeping with the ACR TI-RADS  recommendations - J Am Coll Radiol 3546;[id number]. Electronically Signed   By: Sandi Mariscal M.D.   On: 02/06/2020 15:19   DG C-ARM BRONCHOSCOPY  Result Date: 02/13/2020 C-ARM BRONCHOSCOPY: Fluoroscopy was utilized by the requesting physician.  No radiographic interpretation.       I have independently reviewed the above radiology studies  and reviewed the findings with the patient.   Recent Lab Findings: Lab Results  Component Value Date   WBC 8.1 02/20/2020   HGB 12.8 02/20/2020   HCT 40.1 02/20/2020   PLT 203 02/20/2020   GLUCOSE 106 (H) 02/22/2020   CHOL 249 (H) 11/10/2019   TRIG 53 11/10/2019   HDL 70 11/10/2019   LDLCALC 164 (H) 11/10/2019   ALT 20 02/20/2020   AST 15 02/20/2020   NA 142 02/22/2020   K 4.6 02/22/2020   CL 100 02/22/2020   CREATININE 1.17 (H) 02/22/2020   BUN 18 02/22/2020   CO2 29 02/22/2020   TSH 0.673 05/26/2019   INR 1.0 02/13/2020   HGBA1C 5.7 (H) 09/07/2019      PFTs: 2017 - FVC: 49% - FEV1: 31% -DLCO: 67%  Problem List: 2.7 cm biopsy-proven adenocarcinoma of the left upper lobe History of HIV. History of atrial fibrillation on Xarelto Severe asthma with marginal pulmonary function tests from 2017 Home oxygen use History of respiratory failures following an elective navigational bronchoscopy Poor social support 3.2 cm left thyroid nodule  Assessment / Plan:   This is a 64 year old female who presents with a 2.7 cm biopsy-proven adenoma of the left upper lobe.  She has marginal pulmonary function based off of her testing from 2017.  Additionally she underwent an elective navigational bronchoscopy with biopsy which resulted in respiratory failure and a 5-day hospitalization 3 days of which were in the intensive care unit for observation.  She has been on prednisone for a long time but continues to remain short of breath.  We discussed multiple options for treatment were discussed.  Prior to any recommendations she will need repeat formal pulmonary function testing.  Given her history and recent hospitalization I am concerned that she may not be an ideal surgical candidate.  We discussed this at great lengths, and although she would like to have surgery she understands that this will potentially be very risky for her given her lung function.  I have ordered pulmonary function testing and once performed we will revisit treatment options.  I would like her to be optimized from a pulmonary standpoint prior to testing to get a good assessment.     I  spent 60 minutes with  the patient face to face and greater then 50% of the time was spent in counseling and coordination of care.    Lajuana Matte 03/01/2020 4:41 PM

## 2020-03-01 NOTE — Telephone Encounter (Signed)
I attempted to call Ms. Decoursey regarding her prednisone. I spoke to Dr. Elsworth Soho and he agrees that if she is still taking the prednisone then she should taper it to discontinuation. I was unable to reach her to inform her of this recommendation. I left a HIPPA compliant message to call us back at [facility].  Marianna Payment, D.O. [facility], PGY-2 Pager: [phone], Phone: [phone] Date 03/01/2020 Time 12:57 PM

## 2020-03-04 ENCOUNTER — Ambulatory Visit: Payer: Medicare Other | Admitting: *Deleted

## 2020-03-04 ENCOUNTER — Telehealth: Payer: Self-pay | Admitting: Pulmonary Disease

## 2020-03-04 ENCOUNTER — Ambulatory Visit: Payer: Self-pay

## 2020-03-04 ENCOUNTER — Telehealth: Payer: Self-pay

## 2020-03-04 ENCOUNTER — Telehealth: Payer: Medicare Other

## 2020-03-04 DIAGNOSIS — R911 Solitary pulmonary nodule: Secondary | ICD-10-CM

## 2020-03-04 DIAGNOSIS — J4541 Moderate persistent asthma with (acute) exacerbation: Secondary | ICD-10-CM

## 2020-03-04 DIAGNOSIS — I10 Essential (primary) hypertension: Secondary | ICD-10-CM

## 2020-03-04 DIAGNOSIS — I5032 Chronic diastolic (congestive) heart failure: Secondary | ICD-10-CM

## 2020-03-04 DIAGNOSIS — N183 Chronic kidney disease, stage 3 unspecified: Secondary | ICD-10-CM

## 2020-03-04 NOTE — Patient Instructions (Signed)
Social Worker Visit Information  Goals we discussed today:  Goals Addressed              This Visit's Progress     Patient Stated   .  "I have just been diagnosed with lung cancer, I live alone in a 2 story townhouse, I have no local family and before I start treatment I am going to need help in my home and a hospital bed." (pt-stated)        [name] (see longitudinal plan of care for additional care plan information)  Current Barriers:  . Chronic Disease Management support, education, and care coordination needs related to CAD, HTN, HLD, COPD, CKD Stage 3, Anxiety, and Asthma- patient states she lives in a 2 story town home and due to her pulmonary issues (COPD, Asthma) and recent diagnosis of lung cancer, she can no longer negotiate the stairs, she also reports 3-4 pillow orthopnea   . 02/22/20:  Patient reports during office visit today that hospital bed could not be delivered initially because she was inpatient at the time.  Per patient she "cancelled" the second delivery because she was intending to request order for a lift chair instead.  Patient now stating she would like hospital bed to be ordered again.    Clinical Goal(s) related to CAD, HTN, HLD, COPD, CKD Stage 3, Anxiety, and Asthma:  Over the next 30 days, patient will:  . Work with the care management team to address educational, disease management, and care coordination needs  . Begin or continue self health monitoring activities as directed today  work with care management team to secure DME and home care assistance . Call provider office for new or worsened signs and symptoms CAD, HTN, HLD, COPD, CKD Stage 3, Anxiety, and Asthma . Call care management team with questions or concerns . Verbalize basic understanding of patient centered plan of care established today  Interventions related to CAD, HTN, HLD, COPD, CKD Stage 3, Anxiety, and Asthma:  . Collaboration with scheduling team member who reports patient has  requested SW outreach today to assist with care coordination needs . Successful outbound call placed to the patient  . Determined the patient has yet to receive hospital bed o Collaboration with [place] to discuss barriers in obtaining hospital bed o RN Care Manager reports plan to contact [place] regarding status of DME delivery . Reviewed plan to obtain personal care services under [facility] o Patient reports she has yet to be contacted by [facility] to scheduled assessment o Assisted the patient in contacting [facility] o Confirmed assessment scheduled for 7/13 at 10:30 am- patient stated understanding - Patient reports she would like to use [facility] for [facility] provider . Determined the patient is not feeling well today; reports sinusitis with concern she may develop bronchitis as well as SOB o Collaboration with RN Care Manager to request follow up to assess patient clinical needs . Provided the patient with SW contact information; encouraged the patient to contact this Probation officer as needed   . Patient Self Care Activities related to CAD, HTN, HLD, COPD, CKD Stage 3, Anxiety, and Asthma:  . Patient is unable to independently self-manage chronic health conditions  Please see past updates related to this goal by clicking on the "Past Updates" button in the selected goal          Follow Up Plan: The embedded care management team will continue to follow.   Shannon [name]  Scarlette Ar, CDP Education officer, museum, Certified Dementia Practitioner [place] [facility] [place] [facility] [phone]

## 2020-03-04 NOTE — Telephone Encounter (Signed)
Called and spoke with pt. Pt wants to know why she has been scheduled to receive radiation treatments as she does not recall agreeing to have any radiation performed.  Pt stated that she recently had a visit with Dr. Kipp Brood on 7/9 and wanted to know if Dr. Elsworth Soho has been made aware of that surgical consult. Pt was referred to Dr. Kipp Brood by Dr. Julien Nordmann after recent discussion.  Dr. Elsworth Soho, please advise on all this for pt.

## 2020-03-04 NOTE — Chronic Care Management (AMB) (Signed)
Chronic Care Management    Social Work Follow Up Note  03/04/2020 Name: Shannon [name] MRN: [id number] DOB: [birth year]  Shannon [name] is a 64 y.o. year old female who is a primary care patient of [name], MD. The CCM team was consulted for assistance with care coordination.   Review of patient status, including review of consultants reports, other relevant assessments, and collaboration with appropriate care team members and the patient's provider was performed as part of comprehensive patient evaluation and provision of chronic care management services.    SDOH (Social Determinants of Health) assessments performed: No    Outpatient Encounter Medications as of 03/04/2020  Medication Sig   acetaminophen (TYLENOL) 325 MG tablet Take 325 mg by mouth every 6 (six) hours as needed (FOR PAIN).   albuterol (PROVENTIL) (2.5 MG/3ML) 0.083% nebulizer solution Take 3 mLs (2.5 mg total) by nebulization every 6 (six) hours as needed for wheezing or shortness of breath. USE 1 VIAL VIA NEBULIZER EVERY 6 HOURS AS NEEDED FOR WHEEZING OR SHORTNESS OF BREATH   Ascorbic Acid (VITAMIN C) 1000 MG tablet Take 1,000 mg by mouth every other day.    b complex vitamins tablet Take 1 tablet by mouth daily.   bictegravir-emtricitabine-tenofovir AF (BIKTARVY) 50-200-25 MG TABS tablet Take 1 tablet by mouth daily.   BIOTIN PO Take 1 tablet by mouth daily as needed (for supplementation).    calcium carbonate (OSCAL) 1500 (600 Ca) MG TABS tablet Take 600 mg of elemental calcium by mouth daily with breakfast.   Cholecalciferol (SM VITAMIN D3) 100 MCG (4000 UT) CAPS Take 1 capsule (4,000 Units total) by mouth daily.   cloNIDine (CATAPRES - DOSED IN MG/24 HR) 0.3 mg/24hr patch Place 0.3 mg onto the skin once a week.    cyanocobalamin (,VITAMIN B-12,) 1000 MCG/ML injection ADMINISTER 1 ML(1000 MCG) IN THE MUSCLE EVERY 30 DAYS   cyanocobalamin (,VITAMIN B-12,) 1000 MCG/ML injection Inject 1 mL (1,000 mcg  total) into the muscle every 30 (thirty) days. ADMINISTER 1 ML(1000 MCG) IN THE MUSCLE EVERY 30 DAYS   diltiazem (CARDIZEM CD) 240 MG 24 hr capsule TAKE 1 CAPSULE(240 MG) BY MOUTH DAILY (Patient taking differently: Take 240 mg by mouth daily. )   fluticasone (FLONASE) 50 MCG/ACT nasal spray Place 2 sprays into both nostrils daily as needed for allergies.   fluticasone furoate-vilanterol (BREO ELLIPTA) 200-25 MCG/INH AEPB Inhale 1 puff into the lungs daily.   furosemide (LASIX) 20 MG tablet Take 1 tablet (20 mg total) by mouth every other day.   MAGNESIUM PO Take 1 tablet by mouth every other day.    Melatonin 5 MG TABS Take 5 mg by mouth at bedtime as needed (for sleep).   Misc. Devices (PULSE OXIMETER FOR FINGER) MISC 1 Units by Does not apply route as needed.   Mouthwashes (BIOTENE DRY MOUTH GENTLE) LIQD Use as directed 1 Dose in the mouth or throat 2 (two) times daily as needed (dry mouth).   Omega-3 Fatty Acids (FISH OIL) 1000 MG CAPS Take 1,000 mg by mouth daily as needed (pt prefrence).   omeprazole (PRILOSEC) 40 MG capsule Take 1 capsule (40 mg total) by mouth daily as needed (heart burn).   OXYGEN Inhale 1.5-2 L into the lungs See admin instructions. Uses at bedtime and through the day as needed   polyethylene glycol (MIRALAX / GLYCOLAX) 17 g packet Take 17 g by mouth daily as needed for mild constipation.   PREVIDENT 5000 SENSITIVE 1.1-5 % PSTE Use  daily as directed   PROAIR HFA 108 (90 Base) MCG/ACT inhaler Inhale 2 puffs into the lungs every 4 (four) hours as needed for wheezing or shortness of breath. INHALE 2 PUFFS INTO THE LUNGS EVERY 4 HOURS AS NEEDED FOR WHEEZING OR SHORTNESS OF BREATH   rivaroxaban (XARELTO) 20 MG TABS tablet Take 1 tablet (20 mg total) by mouth daily with supper. Okay to restart this medication on Thursday, 02/15/2020.   senna (SENOKOT) 8.6 MG tablet Take 1 tablet by mouth daily as needed for constipation.   spironolactone (ALDACTONE) 25 MG tablet  Take 1 tablet (25 mg total) by mouth daily.   spironolactone (ALDACTONE) 25 MG tablet Take 0.5 tablets (12.5 mg total) by mouth daily.   SYRINGE/NEEDLE, DISP, 1 ML (B-D SYRINGE/NEEDLE 1CC/25GX5/8) 25G X 5/8" 1 ML MISC 1 Units by Does not apply route every 30 (thirty) days.   vitamin A 3 MG (10000 UNITS) capsule Take 10,000 Units by mouth daily.   zinc gluconate 50 MG tablet Take 50 mg by mouth daily.   No facility-administered encounter medications on file as of 03/04/2020.     Goals Addressed              This Visit's Progress     Patient Stated     "I have just been diagnosed with lung cancer, I live alone in a 2 story townhouse, I have no local family and before I start treatment I am going to need help in my home and a hospital bed." (pt-stated)        [place] (see longitudinal plan of care for additional care plan information)  Current Barriers:   Chronic Disease Management support, education, and care coordination needs related to CAD, HTN, HLD, COPD, CKD Stage 3, Anxiety, and Asthma- patient states she lives in a 2 story town home and due to her pulmonary issues (COPD, Asthma) and recent diagnosis of lung cancer, she can no longer negotiate the stairs, she also reports 3-4 pillow orthopnea    02/22/20:  Patient reports during office visit today that hospital bed could not be delivered initially because she was inpatient at the time.  Per patient she "cancelled" the second delivery because she was intending to request order for a lift chair instead.  Patient now stating she would like hospital bed to be ordered again.    Clinical Goal(s) related to CAD, HTN, HLD, COPD, CKD Stage 3, Anxiety, and Asthma:  Over the next 30 days, patient will:   Work with the care management team to address educational, disease management, and care coordination needs   Begin or continue self health monitoring activities as directed today  work with care management team to secure DME and  home care assistance  Call provider office for new or worsened signs and symptoms CAD, HTN, HLD, COPD, CKD Stage 3, Anxiety, and Asthma  Call care management team with questions or concerns  Verbalize basic understanding of patient centered plan of care established today  Interventions related to CAD, HTN, HLD, COPD, CKD Stage 3, Anxiety, and Asthma:   Collaboration with scheduling team member who reports patient has requested SW outreach today to assist with care coordination needs  Successful outbound call placed to the patient   Determined the patient has yet to receive hospital bed o Collaboration with RN Care Manager to discuss barriers in obtaining hospital bed o RN Care Manager reports plan to contact [place] regarding status of DME delivery  Reviewed plan to obtain  personal care services under [facility] o Patient reports she has yet to be contacted by [facility] to scheduled assessment o Assisted the patient in contacting [facility] o Confirmed assessment scheduled for 7/13 at 10:30 am- patient stated understanding - Patient reports she would like to use [facility] for [facility] provider  Determined the patient is not feeling well today; reports sinusitis with concern she may develop bronchitis as well as SOB o Collaboration with RN Care Manager to request follow up to assess patient clinical needs  Provided the patient with SW contact information; encouraged the patient to contact this Probation officer as needed    Patient Self Care Activities related to CAD, HTN, HLD, COPD, CKD Stage 3, Anxiety, and Asthma:   Patient is unable to independently self-manage chronic health conditions  Please see past updates related to this goal by clicking on the "Past Updates" button in the selected goal          Follow Up Plan: Embedded care coordination team will continue to follow patient and assist with care management needs.   Daneen Schick, BSW, CDP Social  Worker, Certified Dementia Practitioner [phone]

## 2020-03-04 NOTE — Chronic Care Management (AMB) (Signed)
Internal Medicine Clinic Resident  I have personally reviewed this encounter including the documentation in this note and/or discussed this patient with the care management provider. I will address any urgent items identified by the care management provider and will communicate my actions to the patient's PCP. I have reviewed the patient's CCM visit with my supervising attending, Dr Evette Doffing.  Mosetta Anis, MD 03/04/2020

## 2020-03-04 NOTE — Telephone Encounter (Signed)
I have discussed with dr lightfoot. We are awaiting PFTs to see if she could tolerate surgery but based on her prior PFTs, she would not tolerate this Radiation would be the best course sos he should go ahead witht he consultation I will call her once I review PFTs

## 2020-03-04 NOTE — Progress Notes (Signed)
Internal Medicine Clinic Attending  CCM services provided by the care management provider and their documentation were discussed with Dr. Truman Hayward. We reviewed the pertinent findings, urgent action items addressed by the resident and non-urgent items to be addressed by the PCP.  I agree with the assessment, diagnosis, and plan of care documented in the CCM and resident's note.  Axel Filler, MD 03/04/2020

## 2020-03-04 NOTE — Telephone Encounter (Signed)
Requesting to speak with Dr. Marianna Payment, please call pt back.

## 2020-03-04 NOTE — Chronic Care Management (AMB) (Signed)
Chronic Care Management   Follow Up Note   03/04/2020 Name: Shannon [name] MRN: [id number] DOB: 06/13/1956  Referred by: [name], MD Reason for referral : Chronic Care Management (asthma, HTN, CKD, HF)   Shannon [name] is a 64 y.o. year old female who is a primary care patient of [name], MD. The CCM team was consulted for assistance with chronic disease management and care coordination needs.    Review of patient status, including review of consultants reports, relevant laboratory and other test results, and collaboration with appropriate care team members and the patient's provider was performed as part of comprehensive patient evaluation and provision of chronic care management services.    SDOH (Social Determinants of Health) assessments performed: No See Care Plan activities for detailed interventions related to [facility])     Outpatient Encounter Medications as of 03/04/2020  Medication Sig  . acetaminophen (TYLENOL) 325 MG tablet Take 325 mg by mouth every 6 (six) hours as needed (FOR PAIN).  [name] albuterol (PROVENTIL) (2.5 MG/3ML) 0.083% nebulizer solution Take 3 mLs (2.5 mg total) by nebulization every 6 (six) hours as needed for wheezing or shortness of breath. USE 1 VIAL VIA NEBULIZER EVERY 6 HOURS AS NEEDED FOR WHEEZING OR SHORTNESS OF BREATH  . Ascorbic Acid (VITAMIN C) 1000 MG tablet Take 1,000 mg by mouth every other day.   . b complex vitamins tablet Take 1 tablet by mouth daily.  . bictegravir-emtricitabine-tenofovir AF (BIKTARVY) 50-200-25 MG TABS tablet Take 1 tablet by mouth daily.  [name] BIOTIN PO Take 1 tablet by mouth daily as needed (for supplementation).   . calcium carbonate (OSCAL) 1500 (600 Ca) MG TABS tablet Take 600 mg of elemental calcium by mouth daily with breakfast.  . Cholecalciferol (SM VITAMIN D3) 100 MCG (4000 UT) CAPS Take 1 capsule (4,000 Units total) by mouth daily.  . cloNIDine (CATAPRES - DOSED IN MG/24 HR) 0.3 mg/24hr patch Place 0.3 mg onto  the skin once a week.   . cyanocobalamin (,VITAMIN B-12,) 1000 MCG/ML injection ADMINISTER 1 ML(1000 MCG) IN THE MUSCLE EVERY 30 DAYS  . cyanocobalamin (,VITAMIN B-12,) 1000 MCG/ML injection Inject 1 mL (1,000 mcg total) into the muscle every 30 (thirty) days. ADMINISTER 1 ML(1000 MCG) IN THE MUSCLE EVERY 30 DAYS  . diltiazem (CARDIZEM CD) 240 MG 24 hr capsule TAKE 1 CAPSULE(240 MG) BY MOUTH DAILY (Patient taking differently: Take 240 mg by mouth daily. )  . fluticasone (FLONASE) 50 MCG/ACT nasal spray Place 2 sprays into both nostrils daily as needed for allergies.  . fluticasone furoate-vilanterol (BREO ELLIPTA) 200-25 MCG/INH AEPB Inhale 1 puff into the lungs daily.  . furosemide (LASIX) 20 MG tablet Take 1 tablet (20 mg total) by mouth every other day.  [name] MAGNESIUM PO Take 1 tablet by mouth every other day.   . Melatonin 5 MG TABS Take 5 mg by mouth at bedtime as needed (for sleep).  . Misc. Devices (PULSE OXIMETER FOR FINGER) MISC 1 Units by Does not apply route as needed.  . Mouthwashes (BIOTENE DRY MOUTH GENTLE) LIQD Use as directed 1 Dose in the mouth or throat 2 (two) times daily as needed (dry mouth).  . Omega-3 Fatty Acids (FISH OIL) 1000 MG CAPS Take 1,000 mg by mouth daily as needed (pt prefrence).  [name] omeprazole (PRILOSEC) 40 MG capsule Take 1 capsule (40 mg total) by mouth daily as needed (heart burn).  . OXYGEN Inhale 1.5-2 L into the lungs See admin instructions. Uses at  bedtime and through the day as needed  . polyethylene glycol (MIRALAX / GLYCOLAX) 17 g packet Take 17 g by mouth daily as needed for mild constipation.  [name] PREVIDENT 5000 SENSITIVE 1.1-5 % PSTE Use daily as directed  . PROAIR HFA 108 (90 Base) MCG/ACT inhaler Inhale 2 puffs into the lungs every 4 (four) hours as needed for wheezing or shortness of breath. INHALE 2 PUFFS INTO THE LUNGS EVERY 4 HOURS AS NEEDED FOR WHEEZING OR SHORTNESS OF BREATH  . rivaroxaban (XARELTO) 20 MG TABS tablet Take 1 tablet (20 mg total) by  mouth daily with supper. Okay to restart this medication on Thursday, 02/15/2020.  . senna (SENOKOT) 8.6 MG tablet Take 1 tablet by mouth daily as needed for constipation.  [name] spironolactone (ALDACTONE) 25 MG tablet Take 1 tablet (25 mg total) by mouth daily.  [name] spironolactone (ALDACTONE) 25 MG tablet Take 0.5 tablets (12.5 mg total) by mouth daily.  . SYRINGE/NEEDLE, DISP, 1 ML (B-D SYRINGE/NEEDLE 1CC/25GX5/8) 25G X 5/8" 1 ML MISC 1 Units by Does not apply route every 30 (thirty) days.  . vitamin A 3 MG (10000 UNITS) capsule Take 10,000 Units by mouth daily.  [name] zinc gluconate 50 MG tablet Take 50 mg by mouth daily.   No facility-administered encounter medications on file as of 03/04/2020.     Objective:   Goals Addressed              This Visit's Progress     Patient Stated   .  "I have just been diagnosed with lung cancer, I live alone in a 2 story townhouse, I have no local family and before I start treatment I am going to need help in my home and a hospital bed." (pt-stated)        [name] (see longitudinal plan of care for additional care plan information)  Current Barriers:  . Chronic Disease Management support, education, and care coordination needs related to CAD, HTN, HLD, COPD, CKD Stage 3, Anxiety, and Asthma- called patient to follow up on delivery of hospital bed and her c/o sinus infection, bronchitis and increasing dyspnea that she reported to [facility] earlier today. Patient states she is leaving for an appointment and has limited time to talk  Clinical Goal(s) related to CAD, HTN, HLD, COPD, CKD Stage 3, Anxiety, and Asthma:  Over the next 30 days, patient will:  . Work with the care management team to address educational, disease management, and care coordination needs  . Begin or continue self health monitoring activities as directed today  work with care management team to secure DME and home care assistance . Call provider office for new or worsened  signs and symptoms CAD, HTN, HLD, COPD, CKD Stage 3, Anxiety, and Asthma . Call care management team with questions or concerns . Verbalize basic understanding of patient centered plan of care established today  Interventions related to CAD, HTN, HLD, COPD, CKD Stage 3, Anxiety, and Asthma:  . Collaboration with scheduling team member who reports patient has requested SW outreach today to assist with care coordination needs . Successful outbound call placed to the patient  . Determined the patient has yet to receive hospital bed o RN Care Manager received notification from [facility] that hospital bed is scheduled for delivery today 7/12 - informed patient of this- she states she may not be home, advised her that [facility] will call her prior to delivering the bed and she can discuss timing  of delivery during that call. . Reviewed plan to obtain personal care services under [facility] o Patient reports she has yet to be contacted by [facility] to scheduled assessment o Assisted the patient in contacting [facility] o Confirmed assessment scheduled for 7/13 at 10:30 am- patient stated understanding - Patient reports she would like to use [facility] for [facility] provider . Determined the patient is not feeling well today; reports sinusitis with concern she may develop bronchitis as well as SOB o Collaboration with RN Care Manager to request follow up to assess patient clinical needs o Noted patient has clinic appointment scheduled for 7/14 . Provided the patient with SW contact information; encouraged the patient to contact this Probation officer as needed   . Patient Self Care Activities related to CAD, HTN, HLD, COPD, CKD Stage 3, Anxiety, and Asthma:  . Patient is unable to independently self-manage chronic health conditions  Please see past updates related to this goal by clicking on the "Past Updates" button in the selected goal          Plan:   The care  management team will reach out to the patient again over the next 30 days.    Kelli Churn RN, CCM, [facility] RN Care Manager [phone]

## 2020-03-04 NOTE — Progress Notes (Signed)
Internal Medicine Clinic Resident  I have personally reviewed this encounter including the documentation in this note and/or discussed this patient with the care management provider. I will address any urgent items identified by the care management provider and will communicate my actions to the patient's PCP. I have reviewed the patient's CCM visit with my supervising attending, Dr Evette Doffing.  Harvie Heck, MD  [facility] PGY-2 03/04/2020

## 2020-03-04 NOTE — Patient Instructions (Signed)
Visit Information [facility] should be calling you today about delivery of the hospital bed.  Please keep your clinic appointment on Wednesday 03/06/20  Goals Addressed              This Visit's Progress     Patient Stated   .  "I have just been diagnosed with lung cancer, I live alone in a 2 story townhouse, I have no local family and before I start treatment I am going to need help in my home and a hospital bed." (pt-stated)        [place] (see longitudinal plan of care for additional care plan information)  Current Barriers:  . Chronic Disease Management support, education, and care coordination needs related to CAD, HTN, HLD, COPD, CKD Stage 3, Anxiety, and Asthma- called patient to follow up on delivery of hospital bed and her c/o sinus infection, bronchitis and increasing dyspnea that she reported to [facility] earlier today. Patient states she is leaving for an appointment and has limited time to talk  Clinical Goal(s) related to CAD, HTN, HLD, COPD, CKD Stage 3, Anxiety, and Asthma:  Over the next 30 days, patient will:  . Work with the care management team to address educational, disease management, and care coordination needs  . Begin or continue self health monitoring activities as directed today  work with care management team to secure DME and home care assistance . Call provider office for new or worsened signs and symptoms CAD, HTN, HLD, COPD, CKD Stage 3, Anxiety, and Asthma . Call care management team with questions or concerns . Verbalize basic understanding of patient centered plan of care established today  Interventions related to CAD, HTN, HLD, COPD, CKD Stage 3, Anxiety, and Asthma:  . Collaboration with scheduling team member who reports patient has requested SW outreach today to assist with care coordination needs . Successful outbound call placed to the patient  . Determined the patient has yet to receive hospital bed o RN Care Manager received  notification from [facility] that hospital bed is scheduled for delivery today 7/12 - informed patient of this- she states she may not be home, advised her that [facility] will call her prior to delivering the bed and she can discuss timing of delivery during that call. . Reviewed plan to obtain personal care services under [facility] o Patient reports she has yet to be contacted by [facility] to scheduled assessment o Assisted the patient in contacting [facility] o Confirmed assessment scheduled for 7/13 at 10:30 am- patient stated understanding - Patient reports she would like to use [facility] for [facility] provider . Determined the patient is not feeling well today; reports sinusitis with concern she may develop bronchitis as well as SOB o Collaboration with RN Care Manager to request follow up to assess patient clinical needs o Noted patient has clinic appointment scheduled for 7/14 . Provided the patient with SW contact information; encouraged the patient to contact this Probation officer as needed   . Patient Self Care Activities related to CAD, HTN, HLD, COPD, CKD Stage 3, Anxiety, and Asthma:  . Patient is unable to independently self-manage chronic health conditions  Please see past updates related to this goal by clicking on the "Past Updates" button in the selected goal         The patient verbalized understanding of instructions provided today and declined a print copy of patient instruction materials.   The care management team  will reach out to the patient again over the next 30 days.   Kelli Churn RN, CCM, [facility] RN Care Manager [phone]

## 2020-03-05 NOTE — Progress Notes (Signed)
Internal Medicine Clinic Attending  CCM services provided by the care management provider and their documentation were discussed with Dr. Marva Panda. We reviewed the pertinent findings, urgent action items addressed by the resident and non-urgent items to be addressed by the PCP.  I agree with the assessment, diagnosis, and plan of care documented in the CCM and resident's note.  Axel Filler, MD 03/05/2020

## 2020-03-05 NOTE — Telephone Encounter (Signed)
Called and left message for patient to return call.  

## 2020-03-06 ENCOUNTER — Ambulatory Visit (INDEPENDENT_AMBULATORY_CARE_PROVIDER_SITE_OTHER): Payer: Medicare Other | Admitting: Internal Medicine

## 2020-03-06 ENCOUNTER — Telehealth: Payer: Self-pay

## 2020-03-06 ENCOUNTER — Encounter: Payer: Self-pay | Admitting: Internal Medicine

## 2020-03-06 ENCOUNTER — Other Ambulatory Visit: Payer: Self-pay

## 2020-03-06 VITALS — BP 171/103 | HR 104 | Temp 98.3°F | Ht 63.5 in | Wt 232.2 lb

## 2020-03-06 DIAGNOSIS — M940 Chondrocostal junction syndrome [Tietze]: Secondary | ICD-10-CM | POA: Diagnosis not present

## 2020-03-06 DIAGNOSIS — R058 Other specified cough: Secondary | ICD-10-CM

## 2020-03-06 DIAGNOSIS — J40 Bronchitis, not specified as acute or chronic: Secondary | ICD-10-CM | POA: Diagnosis not present

## 2020-03-06 DIAGNOSIS — R05 Cough: Secondary | ICD-10-CM | POA: Diagnosis not present

## 2020-03-06 MED ORDER — AMOXICILLIN 500 MG PO CAPS
500.0000 mg | ORAL_CAPSULE | Freq: Three times a day (TID) | ORAL | 0 refills | Status: AC
Start: 2020-03-06 — End: 2020-03-11

## 2020-03-06 MED ORDER — DICLOFENAC SODIUM 1 % EX GEL: 2.0000 g | Freq: Four times a day (QID) | CUTANEOUS | 0 refills | Status: AC

## 2020-03-06 MED ORDER — BENZONATATE 100 MG PO CAPS: 100.0000 mg | ORAL_CAPSULE | Freq: Three times a day (TID) | ORAL | 1 refills | Status: DC | PRN

## 2020-03-06 NOTE — Progress Notes (Signed)
CC: Coughing  HPI:  Shannon [name] is a 64 y.o. female with a past medical history stated below and presents today for coughing. Please see problem based assessment and plan for additional details.  Past Medical History:  Diagnosis Date  . Anemia   . Anxiety    HX PANIC ATTACKS  . Arthritis    "starting to; in my hands" (07/09/2015)  . Asthma   . Atrial fibrillation ([place])   . Atrial flutter, paroxysmal ([place])   . Bloated abdomen   . CFS (chronic fatigue syndrome)   . Chewing difficulty   . Chronic asthma with acute exacerbation    "I have chronic asthma all the time; sometimes exacerbations" (07/09/2015)  . Chronic lower back pain   . COPD (chronic obstructive pulmonary disease) ([place])   . Cyst of right kidney    "3 of them; dx'd in ~ 01/2015"  . Dyspnea   . GERD (gastroesophageal reflux disease)   . Heart murmur   . History of blood transfusion    "related to my brain surgery I think"  . History of pulmonary embolism 07/09/2015  . HIV antibody positive ([place])   . HIV disease ([place])   . Hyperlipidemia   . Hypertension   . Leg edema   . Lipodystrophy   . Mild CAD 2013  . Multiple thyroid nodules   . Osteopenia   . Palpitations   . Pneumonia 07/09/2015  . Shingles   . Sleep apnea    "never completed part 2 of study; never wore mask" (07/09/2015)  . Vitamin B 12 deficiency   . Vitamin D deficiency     Current Outpatient Medications on File Prior to Visit  Medication Sig Dispense Refill  . acetaminophen (TYLENOL) 325 MG tablet Take 325 mg by mouth every 6 (six) hours as needed (FOR PAIN).    [name] albuterol (PROVENTIL) (2.5 MG/3ML) 0.083% nebulizer solution Take 3 mLs (2.5 mg total) by nebulization every 6 (six) hours as needed for wheezing or shortness of breath. USE 1 VIAL VIA NEBULIZER EVERY 6 HOURS AS NEEDED FOR WHEEZING OR SHORTNESS OF BREATH    . Ascorbic Acid (VITAMIN C) 1000 MG tablet Take 1,000 mg by mouth every other day.     . b complex vitamins tablet Take  1 tablet by mouth daily.    . bictegravir-emtricitabine-tenofovir AF (BIKTARVY) 50-200-25 MG TABS tablet Take 1 tablet by mouth daily. 30 tablet 5  . BIOTIN PO Take 1 tablet by mouth daily as needed (for supplementation).     . calcium carbonate (OSCAL) 1500 (600 Ca) MG TABS tablet Take 600 mg of elemental calcium by mouth daily with breakfast.    . Cholecalciferol (SM VITAMIN D3) 100 MCG (4000 UT) CAPS Take 1 capsule (4,000 Units total) by mouth daily. 30 capsule 0  . cloNIDine (CATAPRES - DOSED IN MG/24 HR) 0.3 mg/24hr patch Place 0.3 mg onto the skin once a week.     . cyanocobalamin (,VITAMIN B-12,) 1000 MCG/ML injection ADMINISTER 1 ML(1000 MCG) IN THE MUSCLE EVERY 30 DAYS 1 mL 0  . cyanocobalamin (,VITAMIN B-12,) 1000 MCG/ML injection Inject 1 mL (1,000 mcg total) into the muscle every 30 (thirty) days. ADMINISTER 1 ML(1000 MCG) IN THE MUSCLE EVERY 30 DAYS 1 mL 1  . diltiazem (CARDIZEM CD) 240 MG 24 hr capsule TAKE 1 CAPSULE(240 MG) BY MOUTH DAILY (Patient taking differently: Take 240 mg by mouth daily. ) 90 capsule 3  . fluticasone (FLONASE) 50 MCG/ACT nasal spray Place  2 sprays into both nostrils daily as needed for allergies.    . fluticasone furoate-vilanterol (BREO ELLIPTA) 200-25 MCG/INH AEPB Inhale 1 puff into the lungs daily. 1 each 5  . furosemide (LASIX) 20 MG tablet Take 1 tablet (20 mg total) by mouth every other day. 30 tablet 2  . MAGNESIUM PO Take 1 tablet by mouth every other day.     . Melatonin 5 MG TABS Take 5 mg by mouth at bedtime as needed (for sleep).    . Misc. Devices (PULSE OXIMETER FOR FINGER) MISC 1 Units by Does not apply route as needed. 1 each 0  . Mouthwashes (BIOTENE DRY MOUTH GENTLE) LIQD Use as directed 1 Dose in the mouth or throat 2 (two) times daily as needed (dry mouth).    . Omega-3 Fatty Acids (FISH OIL) 1000 MG CAPS Take 1,000 mg by mouth daily as needed (pt prefrence).    [name] omeprazole (PRILOSEC) 40 MG capsule Take 1 capsule (40 mg total) by mouth  daily as needed (heart burn).    . OXYGEN Inhale 1.5-2 L into the lungs See admin instructions. Uses at bedtime and through the day as needed    . polyethylene glycol (MIRALAX / GLYCOLAX) 17 g packet Take 17 g by mouth daily as needed for mild constipation.    [name] PREVIDENT 5000 SENSITIVE 1.1-5 % PSTE Use daily as directed  1  . PROAIR HFA 108 (90 Base) MCG/ACT inhaler Inhale 2 puffs into the lungs every 4 (four) hours as needed for wheezing or shortness of breath. INHALE 2 PUFFS INTO THE LUNGS EVERY 4 HOURS AS NEEDED FOR WHEEZING OR SHORTNESS OF BREATH    . rivaroxaban (XARELTO) 20 MG TABS tablet Take 1 tablet (20 mg total) by mouth daily with supper. Okay to restart this medication on Thursday, 02/15/2020. 30 tablet   . senna (SENOKOT) 8.6 MG tablet Take 1 tablet by mouth daily as needed for constipation.    [name] spironolactone (ALDACTONE) 25 MG tablet Take 1 tablet (25 mg total) by mouth daily. 90 tablet 1  . spironolactone (ALDACTONE) 25 MG tablet Take 0.5 tablets (12.5 mg total) by mouth daily. 90 tablet 1  . SYRINGE/NEEDLE, DISP, 1 ML (B-D SYRINGE/NEEDLE 1CC/25GX5/8) 25G X 5/8" 1 ML MISC 1 Units by Does not apply route every 30 (thirty) days. 50 each 0  . vitamin A 3 MG (10000 UNITS) capsule Take 10,000 Units by mouth daily.    [name] zinc gluconate 50 MG tablet Take 50 mg by mouth daily.     No current facility-administered medications on file prior to visit.    Family History  Problem Relation Age of Onset  . Asthma Mother   . Heart failure Mother        cardiomyopathy  . Thyroid disease Mother   . Heart disease Mother   . Obesity Mother   . Hypertension Mother   . Heart murmur Sister   . Heart murmur Brother   . Diabetes Sister   . Thyroid disease Sister   . Heart murmur Sister     Social History   Socioeconomic History  . Marital status: Single    Spouse name: Not on file  . Number of children: Not on file  . Years of education: Not on file  . Highest education level: Not on  file  Occupational History  . Occupation: Ship broker  Tobacco Use  . Smoking status: Never Smoker  . Smokeless tobacco: Never Used  Vaping Use  . Vaping  Use: Never used  Substance and Sexual Activity  . Alcohol use: Not Currently    Alcohol/week: 0.0 standard drinks    Comment: Rarely.  . Drug use: No  . Sexual activity: Not Currently    Partners: Male    Comment: declined condoms  Other Topics Concern  . Not on file  Social History Narrative   Originally from [place] then [place] now [place] 2016 approx   Disabled    Rare alcohol, never smoker, no drug use            Social Determinants of Radio broadcast assistant Strain:   . Difficulty of Paying Living Expenses:   Food Insecurity:   . Worried About Charity fundraiser in the Last Year:   . Arboriculturist in the Last Year:   Transportation Needs: No Transportation Needs  . Lack of Transportation (Medical): No  . Lack of Transportation (Non-Medical): No  Physical Activity: Inactive  . Days of Exercise per Week: 0 days  . Minutes of Exercise per Session: 0 min  Stress:   . Feeling of Stress :   Social Connections:   . Frequency of Communication with Friends and Family:   . Frequency of Social Gatherings with Friends and Family:   . Attends Religious Services:   . Active Member of Clubs or Organizations:   . Attends Archivist Meetings:   [name] Marital Status:   Intimate Partner Violence:   . Fear of Current or Ex-Partner:   . Emotionally Abused:   [name] Physically Abused:   . Sexually Abused:     Review of Systems: ROS negative except for what is noted on the assessment and plan.  Vitals:   03/06/20 1334 03/06/20 1340  BP: (!) 156/105 (!) 171/103  Pulse: (!) 103 (!) 104  Temp: 98.1 F (36.7 C) 98.3 F (36.8 C)  TempSrc: Oral   SpO2: 96%   Weight: 232 lb 3.2 oz (105.3 kg)   Height: 5' 3.5" (1.613 m)      Physical Exam: Physical Exam Constitutional:      Appearance: Normal appearance.  HENT:     Head:  Normocephalic and atraumatic.     Mouth/Throat:     Mouth: Mucous membranes are dry.     Pharynx: Oropharynx is clear. No oropharyngeal exudate or posterior oropharyngeal erythema.  Cardiovascular:     Rate and Rhythm: Normal rate.     Pulses: Normal pulses.     Heart sounds: Normal heart sounds.  Pulmonary:     Effort: Pulmonary effort is normal.     Breath sounds: Normal breath sounds.  Chest:     Chest wall: Tenderness (right sided and TTP) present.  Abdominal:     Tenderness: There is no abdominal tenderness.  Musculoskeletal:        General: Normal range of motion.     Cervical back: Normal range of motion.     Right lower leg: No edema.     Left lower leg: No edema.  Skin:    General: Skin is warm and dry.  Neurological:     Mental Status: She is alert and oriented to person, place, and time. Mental status is at baseline.  Psychiatric:        Mood and Affect: Mood normal.      Assessment & Plan:   See Encounters Tab for problem based charting.  Patient discussed with Dr. Bertell Maria, D.O. [facility], PGY-2  Pager: [phone], Phone: [phone] Date 03/07/2020 Time 9:50 AM

## 2020-03-06 NOTE — Telephone Encounter (Signed)
Patient contacted the office 03/04/20 with concerns about her needing a radiation consultation before having PFTs to evaluate her candidacy for surgery.  Patient requested clarification.  Patient advised that Dr. Kipp Brood would be made aware.  Per Dr. Kipp Brood, patient would be contacted.

## 2020-03-06 NOTE — Patient Instructions (Signed)
Thank you, Shannon [name] for allowing [place] to provide your care today. Today we discussed Sinusitis/bronchitis.    I have ordered the following labs for you:  Lab Orders  No laboratory test(s) ordered today     I will call if any are abnormal. All of your labs can be accessed through "My Chart".   I have ordered the following medication/changed the following medications:  1. begin Amoxacillin 500 mg three time daily for 5 days if symptoms do not improve in 3 days 2. begin Tassalon perles as needed 3 times daily 3. begin Voltaren Gel apply to affected area 4 times daily as needed  Please follow-up as needed  Should you have any questions or concerns please call the internal medicine clinic at [phone].    Marianna Payment, D.O. [facility]   My Chart Access: [email or web address]   If you have not already done so, please get your COVID 19 vaccine  To schedule an appointment for a COVID vaccine choice any of the following: Go to [email or web address]   Go to [email or web address]                  Call [phone]                                     Call [phone] and select Option 2

## 2020-03-06 NOTE — Telephone Encounter (Signed)
LMTCB x2 for pt 

## 2020-03-07 ENCOUNTER — Encounter: Payer: Self-pay | Admitting: Internal Medicine

## 2020-03-07 DIAGNOSIS — M940 Chondrocostal junction syndrome [Tietze]: Secondary | ICD-10-CM | POA: Insufficient documentation

## 2020-03-07 DIAGNOSIS — J40 Bronchitis, not specified as acute or chronic: Secondary | ICD-10-CM | POA: Insufficient documentation

## 2020-03-07 NOTE — Telephone Encounter (Signed)
Spoke with the pt  I relayed the msg from Dr Elsworth Soho  She states this does not make since b/c it seems to her this is being done backwards  She does not understand why she is starting RT before having the breathing test done  She states that she is wanting to see Dr Elsworth Soho for appt  She cancelled her appt with TP for 7/19 b/c she had 2 other appts same day  Dr Elsworth Soho has nothing available  She is not happy with this and states that she should not have to wait  Please advise thanks

## 2020-03-07 NOTE — Telephone Encounter (Signed)
Spoke with Shannon [name] and explained the message from Dr. Elsworth Soho. Shannon [name] understood and will go to the appt on Monday 7/19 with Dr. Sondra Come. Nothing further is needed.

## 2020-03-07 NOTE — Telephone Encounter (Signed)
7/19 is only a consultation - they will not start radiation that day - so I would  strongly advise her to keep that appt.to avoid delays in treatment 7/23 PFTs - I will review & advise her afterwards how to proceed.

## 2020-03-07 NOTE — Assessment & Plan Note (Signed)
Patient presents with point tenderness over her right chest in the setting of recent URI with coughing.  Physical exam finding consistent with costochondritis.  Plan: - Voltaren 1% gel QID

## 2020-03-07 NOTE — Assessment & Plan Note (Addendum)
Patient presents with a 1 week history of nasal drainage and sinus pressure and coughing up green phlegm. She states that she has an associated sore throat and feels like she is wheezing.    On exam, her lungs sound clear and her oral mucosa is moist without erythema or significant edema.   I counseled her that this is likely a mild upper respiratory viral illness and offered conservative management with tessalon Perles, nasal steroid, and nasal irrigation. She is very concerned that she needs an antibiotic and persist that her symptoms are due to a bacterial infection. I counseled her that I will give her a prescription for amoxicillin and instructed her to wait 3-5 days to see if her symptoms improved prior to filling it.   Plan: 1. Prescribed Tessalon Perles  2. Amoxicillin 500 mg BID for 7 days for bronchitis

## 2020-03-08 NOTE — Progress Notes (Signed)
DIAGNOSIS: Stage IA (T1c, N0, M0) non-small cell lung cancer, adenocarcinoma presented with left upper lobe pulmonary nodule diagnosed in June 2021.  PRIOR THERAPY: None  CURRENT THERAPY: None  INTERVAL HISTORY: Shannon [name] 64 y.o. female returns to the clinic today for follow-up visit.  The patient is feeling fine today with no concerning complaints except for mild shortness of breath with exertion.  She denied having any current cough or hemoptysis she has occasional chest pain.  She denied having any nausea, vomiting, diarrhea or constipation.  She denied having any headache or visual changes.  She has no fever or chills.  The patient underwent bronchoscopy with endobronchial biopsy of the left upper lobe lung nodule under the care of Dr. Lamonte Sakai and the final pathology was consistent with adenocarcinoma.  She is here today for evaluation and discussion of her treatment options. Per Dr. Earlie Server.  Past/Anticipated interventions by cardiothoracic surgery, if any: none at this time, waiting on results of PFT's.  Past/Anticipated interventions by medical oncology, if any: no  Signs/Symptoms  Weight changes, if any: gained 10 lbs in 2 weeks  Respiratory complaints, if any: has shortness of breath and bil upper chest pain  Hemoptysis, if any: no  Pain issues, if any: 4  SAFETY ISSUES:  Prior radiation? no  Pacemaker/ICD? no  Possible current pregnancy? Hysterectomy  Is the patient on methotrexate? no  Current Complaints / other details:  On amoxicillin  For sinusitis. Reports increased constipation for 10 days. Has bil upper chest pain for 2 weeks.  BP (!) 190/90 (BP Location: Left Wrist, Patient Position: Sitting)   Pulse 97   Temp 98.3 F (36.8 C) (Oral)   Resp 20   Ht 5' 3.5" (1.613 m)   SpO2 93%   BMI 40.49 kg/m    Wt Readings from Last 3 Encounters:  03/06/20 232 lb 3.2 oz (105.3 kg)  03/01/20 229 lb (103.9 kg)  02/20/20 231 lb 1.6 oz (104.8 kg)

## 2020-03-10 NOTE — Progress Notes (Signed)
Radiation Oncology         [phone] ________________________________  Initial Outpatient Consultation  Name: Shannon [name] MRN: [id number]  Date: 03/11/2020  DOB: May 25, 1956  EG:BTDVVO, Peri Jefferson, MD  Rigoberto Noel, MD   REFERRING PHYSICIAN: Rigoberto Noel, MD  DIAGNOSIS: The primary encounter diagnosis was Nodule of upper lobe of left lung. A diagnosis of Adenocarcinoma of left lung ([place]) was also pertinent to this visit.  Stage IA (T1c, N0, M0) non-small cell lung cancer of the left upper lobe, adenocarcinoma  HISTORY OF PRESENT ILLNESS::Shannon [name] is a 64 y.o. female who is seen as a courtesy of Dr. Elsworth Soho for an opinion concerning radiation therapy as part of management for her recently diagnosed lung cancer. Today she is accompanied by no one. She was initially seen by Dr. Rebeca Alert, PCP, on 05/13/2021for worsening dyspnea and increased leg swelling. Chest x-ray on that day showed a nodular opacity in the left mid lung lateral to the left hilum that measured 2.5 x 1.9 cm. Lungs were elsewhere clear. Given the above results, a chest CT scan was performed on 01/10/2020 and showed a mass-like consolidation in the left upper lobe with adjacent atelectasis/consolidation that was suspicious for malignancy.  PET scan on 01/25/2020 showed a hypermetabolic left upper lobe pulmonary lesion that was consistent with primary bronchogenic neoplasm. Additionally, there was one enlarged, but mildly hypermetabolic, left axillary node that was somewhat atypical for metastatic lung cancer. Given that the patient had reportedly had a recent COVID-19 vaccination around that time, close attention to follow-up was recommended. There was also noted to be fairly marked asymmetric hypermetabolism in the posterior right nasopharynx in the region of the fossa of [place]muller. There was no soft tissue mass lesion evident on CT imaging, but direct visualization was recommended to exclude mucosal lesion. Finally, there  was hypermetabolism in the left thyroid lobe apparently associated with a thyroid nodule in addition to multiple bilateral renal lesions of varying size and attenuation. MRI on 03/31/2019 characterized bilateral Bosniak I and II cysts. No definite hypermetabolic metastatic disease in the left hilum or mediastinum.  Ultrasound of thyroid on 02/06/2020 showed findings suggestive of multinodular goiter. Nodules #4 and #6 both met criteria to recommend one year follow-up and nodule #5 met criteria to recommend percutaneous sampling.  The patient was followed by Dr. Elsworth Soho in 2019 for severe persistent asthma and nocturnal hypoxemia. She was referred back to him and was seen on 02/05/2020. At that time, they discussed various options of biopsy including bronchoscopy, CT-guided needle aspiration, and surgical biopsy.   The patient was then referred to Dr. Julien Nordmann and was seen in consultation on 02/06/2020. At that time, it was recommended that the patient proceed with bronchoscopy followed by MRI of brain and possible surgical resection versus SBRT.  The patient underwent a video bronchoscopy with electromagnetic navigation on 02/13/2020 that was performed by Dr. Lamonte Sakai. Pathology from the procedure revealed adenocarcinoma of the left upper lobe. Cytology revealed malignant cells in the left upper lobe brushing and needle aspiration.  Of note, the patient was admitted to the hospital for asthma exacerbation following bronchoscopy. She was stabilized and discharged home on 02/17/2020.  The patient was last seen by Dr. Julien Nordmann on 02/20/2020, during which time it was recommended that she see cardiothoracic surgery for evaluation of surgical resection. If not a surgical candidate, then she would likely benefit from SBRT directed at the left upper lobe lung nodule.  The patient was evaluated by Dr. Kipp Brood,  cardiothoracic surgeon, on 03/01/2020. Based off of her testing from 2017, she was noted to have marginal  pulmonary function. They discussed multiple treatment options but prior to any recommendations, she would need to have repeat formal pulmonary function testing. Given her history and recent hospitalization, there was concern that she may not be an ideal surgical candidate. PFT results are pending at this time.  Ultimately cardiothoracic surgery and the patient decided against surgery.  PREVIOUS RADIATION THERAPY: No  PAST MEDICAL HISTORY:  Past Medical History:  Diagnosis Date  . Anemia   . Anxiety    HX PANIC ATTACKS  . Arthritis    "starting to; in my hands" (07/09/2015)  . Asthma   . Atrial fibrillation ([place])   . Atrial flutter, paroxysmal ([place])   . Bloated abdomen   . CFS (chronic fatigue syndrome)   . Chewing difficulty   . Chronic asthma with acute exacerbation    "I have chronic asthma all the time; sometimes exacerbations" (07/09/2015)  . Chronic lower back pain   . COPD (chronic obstructive pulmonary disease) ([place])   . Cyst of right kidney    "3 of them; dx'd in ~ 01/2015"  . Dyspnea   . GERD (gastroesophageal reflux disease)   . Heart murmur   . History of blood transfusion    "related to my brain surgery I think"  . History of pulmonary embolism 07/09/2015  . HIV antibody positive ([place])   . HIV disease ([place])   . Hyperlipidemia   . Hypertension   . Leg edema   . Lipodystrophy   . Mild CAD 2013  . Multiple thyroid nodules   . Osteopenia   . Palpitations   . Pneumonia 07/09/2015  . Shingles   . Sleep apnea    "never completed part 2 of study; never wore mask" (07/09/2015)  . Vitamin B 12 deficiency   . Vitamin D deficiency     PAST SURGICAL HISTORY: Past Surgical History:  Procedure Laterality Date  . ABDOMINAL HYSTERECTOMY     "robotic laparosopic"  . BRAIN SURGERY  1974   "brain tumor; benign; on top of my brain; got a plate in there"  . BRAIN SURGERY     age 65- -"Tumor pushing my skullout"  . BRONCHIAL BIOPSY  02/13/2020   Procedure: BRONCHIAL  BIOPSIES;  Surgeon: [name], MD;  Location[facility] ENDOSCOPY;  Service: Pulmonary;;  . BRONCHIAL BRUSHINGS  02/13/2020   Procedure: BRONCHIAL BRUSHINGS;  Surgeon: [name], MD;  Location: [facility] ENDOSCOPY;  Service: Pulmonary;;  . BRONCHIAL NEEDLE ASPIRATION BIOPSY  02/13/2020   Procedure: BRONCHIAL NEEDLE ASPIRATION BIOPSIES;  Surgeon: [name], MD;  Location: [facility] ENDOSCOPY;  Service: Pulmonary;;  . CARDIAC CATHETERIZATION    . FIDUCIAL MARKER PLACEMENT  02/13/2020   Procedure: FIDUCIAL MARKER PLACEMENT;  Surgeon: [name], MD;  Location: [facility] ENDOSCOPY;  Service: Pulmonary;;  . TONSILLECTOMY AND ADENOIDECTOMY    . VIDEO BRONCHOSCOPY WITH ENDOBRONCHIAL NAVIGATION N/A 02/13/2020   Procedure: VIDEO BRONCHOSCOPY WITH ENDOBRONCHIAL NAVIGATION;  Surgeon: [name], MD;  Location: [facility] ENDOSCOPY;  Service: Pulmonary;  Laterality: N/A;    FAMILY HISTORY:  Family History  Problem Relation Age of Onset  . Asthma Mother   . Heart failure Mother        cardiomyopathy  . Thyroid disease Mother   . Heart disease Mother   . Obesity Mother   . Hypertension Mother   . Heart murmur Sister   . Heart murmur Brother   .  Diabetes Sister   . Thyroid disease Sister   . Heart murmur Sister     SOCIAL HISTORY:  Social History   Tobacco Use  . Smoking status: Never Smoker  . Smokeless tobacco: Never Used  Vaping Use  . Vaping Use: Never used  Substance Use Topics  . Alcohol use: Not Currently    Alcohol/week: 0.0 standard drinks    Comment: Rarely.  . Drug use: No    ALLERGIES:  Allergies  Allergen Reactions  . Tree Extract Swelling and Other (See Comments)    Swelling to eyes  . Augmentin [Amoxicillin-Pot Clavulanate] Other (See Comments)    Headache, dizzy  . Lisinopril Cough    Face/throat swelling  . Ciprofloxacin Hives    MEDICATIONS:  Current Outpatient Medications  Medication Sig Dispense Refill  . acetaminophen (TYLENOL) 325 MG tablet Take 325 mg by mouth  every 6 (six) hours as needed (FOR PAIN).    [name] albuterol (PROVENTIL) (2.5 MG/3ML) 0.083% nebulizer solution Take 3 mLs (2.5 mg total) by nebulization every 6 (six) hours as needed for wheezing or shortness of breath. USE 1 VIAL VIA NEBULIZER EVERY 6 HOURS AS NEEDED FOR WHEEZING OR SHORTNESS OF BREATH    . amoxicillin (AMOXIL) 500 MG capsule Take 1 capsule (500 mg total) by mouth 3 (three) times daily for 5 days. 15 capsule 0  . Ascorbic Acid (VITAMIN C) 1000 MG tablet Take 1,000 mg by mouth every other day.     . b complex vitamins tablet Take 1 tablet by mouth daily.    . benzonatate (TESSALON PERLES) 100 MG capsule Take 1 capsule (100 mg total) by mouth 3 (three) times daily as needed for cough. 30 capsule 1  . bictegravir-emtricitabine-tenofovir AF (BIKTARVY) 50-200-25 MG TABS tablet Take 1 tablet by mouth daily. 30 tablet 5  . BIOTIN PO Take 1 tablet by mouth daily as needed (for supplementation).     . calcium carbonate (OSCAL) 1500 (600 Ca) MG TABS tablet Take 600 mg of elemental calcium by mouth daily with breakfast.    . Cholecalciferol (SM VITAMIN D3) 100 MCG (4000 UT) CAPS Take 1 capsule (4,000 Units total) by mouth daily. 30 capsule 0  . cloNIDine (CATAPRES - DOSED IN MG/24 HR) 0.3 mg/24hr patch Place 0.3 mg onto the skin once a week.     . cyanocobalamin (,VITAMIN B-12,) 1000 MCG/ML injection ADMINISTER 1 ML(1000 MCG) IN THE MUSCLE EVERY 30 DAYS 1 mL 0  . cyanocobalamin (,VITAMIN B-12,) 1000 MCG/ML injection Inject 1 mL (1,000 mcg total) into the muscle every 30 (thirty) days. ADMINISTER 1 ML(1000 MCG) IN THE MUSCLE EVERY 30 DAYS 1 mL 1  . diclofenac Sodium (VOLTAREN) 1 % GEL Apply 2 g topically 4 (four) times daily. 112 g 0  . diltiazem (CARDIZEM CD) 240 MG 24 hr capsule TAKE 1 CAPSULE(240 MG) BY MOUTH DAILY (Patient taking differently: Take 240 mg by mouth daily. ) 90 capsule 3  . fluticasone (FLONASE) 50 MCG/ACT nasal spray Place 2 sprays into both nostrils daily as needed for  allergies.    . fluticasone furoate-vilanterol (BREO ELLIPTA) 200-25 MCG/INH AEPB Inhale 1 puff into the lungs daily. 1 each 5  . furosemide (LASIX) 20 MG tablet Take 1 tablet (20 mg total) by mouth every other day. 30 tablet 2  . MAGNESIUM PO Take 1 tablet by mouth every other day.     . Melatonin 5 MG TABS Take 5 mg by mouth at bedtime as needed (for sleep).    [name]  Misc. Devices (PULSE OXIMETER FOR FINGER) MISC 1 Units by Does not apply route as needed. 1 each 0  . Mouthwashes (BIOTENE DRY MOUTH GENTLE) LIQD Use as directed 1 Dose in the mouth or throat 2 (two) times daily as needed (dry mouth).    . Omega-3 Fatty Acids (FISH OIL) 1000 MG CAPS Take 1,000 mg by mouth daily as needed (pt prefrence).    [name] omeprazole (PRILOSEC) 40 MG capsule Take 1 capsule (40 mg total) by mouth daily as needed (heart burn).    . OXYGEN Inhale 1.5-2 L into the lungs See admin instructions. Uses at bedtime and through the day as needed    . polyethylene glycol (MIRALAX / GLYCOLAX) 17 g packet Take 17 g by mouth daily as needed for mild constipation.    [name] PREVIDENT 5000 SENSITIVE 1.1-5 % PSTE Use daily as directed  1  . PROAIR HFA 108 (90 Base) MCG/ACT inhaler Inhale 2 puffs into the lungs every 4 (four) hours as needed for wheezing or shortness of breath. INHALE 2 PUFFS INTO THE LUNGS EVERY 4 HOURS AS NEEDED FOR WHEEZING OR SHORTNESS OF BREATH    . rivaroxaban (XARELTO) 20 MG TABS tablet Take 1 tablet (20 mg total) by mouth daily with supper. Okay to restart this medication on Thursday, 02/15/2020. 30 tablet   . senna (SENOKOT) 8.6 MG tablet Take 1 tablet by mouth daily as needed for constipation.    [name] spironolactone (ALDACTONE) 25 MG tablet Take 1 tablet (25 mg total) by mouth daily. 90 tablet 1  . spironolactone (ALDACTONE) 25 MG tablet Take 0.5 tablets (12.5 mg total) by mouth daily. 90 tablet 1  . SYRINGE/NEEDLE, DISP, 1 ML (B-D SYRINGE/NEEDLE 1CC/25GX5/8) 25G X 5/8" 1 ML MISC 1 Units by Does not apply route every 30  (thirty) days. 50 each 0  . vitamin A 3 MG (10000 UNITS) capsule Take 10,000 Units by mouth daily.    [name] zinc gluconate 50 MG tablet Take 50 mg by mouth daily.     No current facility-administered medications for this encounter.    REVIEW OF SYSTEMS:  A 10+ POINT REVIEW OF SYSTEMS WAS OBTAINED including neurology, dermatology, psychiatry, cardiac, respiratory, lymph, extremities, GI, GU, musculoskeletal, constitutional, reproductive, HEENT.  She reports noticing some mild worsening of her breathing over the past several weeks.  She denies any significant cough or hemoptysis.   PHYSICAL EXAM:  height is 5' 3.5" (1.613 m). Her oral temperature is 98.3 F (36.8 C). Her blood pressure is 190/90 (abnormal) and her pulse is 97. Her respiration is 20 and oxygen saturation is 93%.   General: Alert and oriented, in no acute distress HEENT: Head is normocephalic. Extraocular movements are intact. Oropharynx is clear.  Several teeth missing Neck: Neck is supple, no palpable cervical or supraclavicular lymphadenopathy. Heart: Regular in rate and rhythm with no murmurs, rubs, or gallops. Chest: Clear to auscultation bilaterally, with no rhonchi, wheezes, or rales. Abdomen: Soft, nontender, nondistended, with no rigidity or guarding. Extremities: No cyanosis or edema. Lymphatics: see Neck Exam Skin: No concerning lesions. Musculoskeletal: symmetric strength and muscle tone throughout. Neurologic: Cranial nerves II through XII are grossly intact. No obvious focalities. Speech is fluent. Coordination is intact. Psychiatric: Judgment and insight are intact. Affect is appropriate.   ECOG = 2  0 - Asymptomatic (Fully active, able to carry on all predisease activities without restriction)  1 - Symptomatic but completely ambulatory (Restricted in physically strenuous activity but ambulatory and able to carry out work  of a light or sedentary nature. For example, light housework, office work)  2 -  Symptomatic, <50% in bed during the day (Ambulatory and capable of all self care but unable to carry out any work activities. Up and about more than 50% of waking hours)  3 - Symptomatic, >50% in bed, but not bedbound (Capable of only limited self-care, confined to bed or chair 50% or more of waking hours)  4 - Bedbound (Completely disabled. Cannot carry on any self-care. Totally confined to bed or chair)  5 - Death   Eustace Pen MM, Creech RH, Tormey DC, et al. [phone]). "Toxicity and response criteria of the [facility]". [place] Oncol. 5 (6): 649-55  LABORATORY DATA:  Lab Results  Component Value Date   WBC 8.1 02/20/2020   HGB 12.8 02/20/2020   HCT 40.1 02/20/2020   MCV 95.5 02/20/2020   PLT 203 02/20/2020   NEUTROABS 5.2 02/20/2020   Lab Results  Component Value Date   NA 142 02/22/2020   K 4.6 02/22/2020   CL 100 02/22/2020   CO2 29 02/22/2020   GLUCOSE 106 (H) 02/22/2020   CREATININE 1.17 (H) 02/22/2020   CALCIUM 10.3 02/22/2020      RADIOGRAPHY: DG Chest Port 1 View  Result Date: 02/17/2020 CLINICAL DATA:  Abnormal respirations. Follow-up exam. Status post video bronchoscopy on 02/13/2020. EXAM: PORTABLE CHEST 1 VIEW COMPARISON:  02/15/2020 and older studies. FINDINGS: Cardiac silhouette is normal in size. No mediastinal or hilar masses. There is hazy opacity lateral to the left hilum consistent with the left upper lobe lingular mass noted on the CT from 01/10/2020. Remainder of the lungs is clear. No convincing pleural effusion and no pneumothorax. IMPRESSION: 1. No acute findings, and no significant change from the previous day's study. Electronically Signed   By: Lajean Manes M.D.   On: 02/17/2020 08:45   DG Chest Port 1 View  Result Date: 02/15/2020 CLINICAL DATA:  Abnormal respirations. EXAM: PORTABLE CHEST 1 VIEW COMPARISON:  02/13/2020 PET-CT 01/25/2020. FINDINGS: Patient rotated to the right. Aortic ectasia noted. Stable cardiomegaly. Stable  density noted over the left mid lung. Reference is made to recent PET-CT. No acute infiltrate. No pleural effusion or pneumothorax. IMPRESSION: 1. Stable density noted the left mid lung. Reference is made to recent PET-CT report. No acute infiltrate. 2.  Stable cardiomegaly. Electronically Signed   By: Marcello Moores  Register   On: 02/15/2020 08:08   DG Chest Port 1 View  Result Date: 02/13/2020 CLINICAL DATA:  64 year old female status post left lung bronchoscopy. EXAM: PORTABLE CHEST 1 VIEW COMPARISON:  Chest radiograph dated 01/04/2020 and CT dated 01/10/2020. FINDINGS: Faint focal nodular area in the left mid lung field as seen previously. Several surgical clips noted over the left mid lung field. No new consolidative changes. There is no pleural effusion or pneumothorax. Mild cardiomegaly. No acute osseous pathology. IMPRESSION: No acute cardiopulmonary process.  No pneumothorax. Electronically Signed   By: Anner Crete M.D.   On: 02/13/2020 16:40   DG C-ARM BRONCHOSCOPY  Result Date: 02/13/2020 C-ARM BRONCHOSCOPY: Fluoroscopy was utilized by the requesting physician.  No radiographic interpretation.      IMPRESSION: Stage IA (T1c, N0, M0) non-small cell lung cancer of the left upper lobe, adenocarcinoma  Given the patient's medical issues she is not felt to be a good candidate for surgery.  She would be a good candidate for stereotactic body radiation therapy directed at her clinical stage I non-small cell lung cancer.  Patient has not completed a brain MRI which was ordered.  We discussed this and she does not wish to proceed with this prior to radiation therapy.  Patient will proceed on with pulmonary  function testing later this week and will have her biopsy of thyroid gland next week.  Patient would like to delay scheduling of her SBRT given her severe problems with constipation.  She reports not having bowel movement in 2 weeks.  She will discuss this issue further with her primary care  physician to obtain treatment plan for this issue.  Patient will be scheduled for simulation in mid August  Today, I talked to the patient  about the findings and work-up thus far.  We discussed the natural history of lung cancer and general treatment, highlighting the role of radiotherapy (SBRT) in the management.  We discussed the available radiation techniques, and focused on the details of logistics and delivery.  We reviewed the anticipated acute and late sequelae associated with radiation in this setting.  The patient was encouraged to ask questions that I answered to the best of my ability.  A patient consent form was discussed and signed.  We retained a copy for our records.  The patient would like to proceed with radiation and will be scheduled for CT simulation.  PLAN: Ultrasound-guided biopsy of thyroid is scheduled for 03/19/2020.  SBRT simulation will be scheduled for the second week of August.  Treatments to begin soon afterward.  Anticipate 3 SBRT treatments.  Total time spent in this encounter was 65 minutes which included reviewing the patient's most recent imaging (chest x-rays, CT scan, PET scan, ultrasound) consultations, follow-ups, bronchoscopy, pathology/cytology reports, physical examination, and documentation.  ------------------------------------------------  Blair Promise, PhD, MD  This document serves as a record of services personally performed by Gery Pray, MD. It was created on his behalf by Clerance Lav, a trained medical scribe. The creation of this record is based on the scribe's personal observations and the provider's statements to them. This document has been checked and approved by the attending provider.

## 2020-03-11 ENCOUNTER — Ambulatory Visit
Admission: RE | Admit: 2020-03-11 | Discharge: 2020-03-11 | Disposition: A | Discharge status: Home / Self Care | Payer: Medicare Other | Source: Ambulatory Visit | Attending: Radiation Oncology | Admitting: Radiation Oncology

## 2020-03-11 ENCOUNTER — Ambulatory Visit: Payer: Medicare Other | Admitting: Adult Health

## 2020-03-11 ENCOUNTER — Other Ambulatory Visit: Payer: Self-pay

## 2020-03-11 ENCOUNTER — Encounter: Payer: Self-pay | Admitting: Radiation Oncology

## 2020-03-11 VITALS — BP 190/90 | HR 97 | Temp 98.3°F | Resp 20 | Ht 63.5 in

## 2020-03-11 DIAGNOSIS — F419 Anxiety disorder, unspecified: Secondary | ICD-10-CM | POA: Insufficient documentation

## 2020-03-11 DIAGNOSIS — C3492 Malignant neoplasm of unspecified part of left bronchus or lung: Secondary | ICD-10-CM

## 2020-03-11 DIAGNOSIS — Z7901 Long term (current) use of anticoagulants: Secondary | ICD-10-CM | POA: Diagnosis not present

## 2020-03-11 DIAGNOSIS — E785 Hyperlipidemia, unspecified: Secondary | ICD-10-CM | POA: Diagnosis not present

## 2020-03-11 DIAGNOSIS — I4891 Unspecified atrial fibrillation: Secondary | ICD-10-CM | POA: Insufficient documentation

## 2020-03-11 DIAGNOSIS — D649 Anemia, unspecified: Secondary | ICD-10-CM | POA: Diagnosis not present

## 2020-03-11 DIAGNOSIS — Z79899 Other long term (current) drug therapy: Secondary | ICD-10-CM | POA: Diagnosis not present

## 2020-03-11 DIAGNOSIS — G4730 Sleep apnea, unspecified: Secondary | ICD-10-CM | POA: Insufficient documentation

## 2020-03-11 DIAGNOSIS — R011 Cardiac murmur, unspecified: Secondary | ICD-10-CM | POA: Insufficient documentation

## 2020-03-11 DIAGNOSIS — C3412 Malignant neoplasm of upper lobe, left bronchus or lung: Secondary | ICD-10-CM | POA: Insufficient documentation

## 2020-03-11 DIAGNOSIS — E559 Vitamin D deficiency, unspecified: Secondary | ICD-10-CM | POA: Diagnosis not present

## 2020-03-11 DIAGNOSIS — I10 Essential (primary) hypertension: Secondary | ICD-10-CM | POA: Insufficient documentation

## 2020-03-11 DIAGNOSIS — J449 Chronic obstructive pulmonary disease, unspecified: Secondary | ICD-10-CM | POA: Diagnosis not present

## 2020-03-11 DIAGNOSIS — E041 Nontoxic single thyroid nodule: Secondary | ICD-10-CM | POA: Diagnosis not present

## 2020-03-11 DIAGNOSIS — Z791 Long term (current) use of non-steroidal anti-inflammatories (NSAID): Secondary | ICD-10-CM | POA: Insufficient documentation

## 2020-03-11 DIAGNOSIS — Z7951 Long term (current) use of inhaled steroids: Secondary | ICD-10-CM | POA: Diagnosis not present

## 2020-03-11 DIAGNOSIS — I2510 Atherosclerotic heart disease of native coronary artery without angina pectoris: Secondary | ICD-10-CM | POA: Diagnosis not present

## 2020-03-11 DIAGNOSIS — B20 Human immunodeficiency virus [HIV] disease: Secondary | ICD-10-CM | POA: Diagnosis not present

## 2020-03-11 DIAGNOSIS — M8580 Other specified disorders of bone density and structure, unspecified site: Secondary | ICD-10-CM | POA: Diagnosis not present

## 2020-03-11 DIAGNOSIS — Z86711 Personal history of pulmonary embolism: Secondary | ICD-10-CM | POA: Diagnosis not present

## 2020-03-11 DIAGNOSIS — M129 Arthropathy, unspecified: Secondary | ICD-10-CM | POA: Insufficient documentation

## 2020-03-11 DIAGNOSIS — K219 Gastro-esophageal reflux disease without esophagitis: Secondary | ICD-10-CM | POA: Insufficient documentation

## 2020-03-11 DIAGNOSIS — I119 Hypertensive heart disease without heart failure: Secondary | ICD-10-CM | POA: Insufficient documentation

## 2020-03-11 DIAGNOSIS — K5900 Constipation, unspecified: Secondary | ICD-10-CM | POA: Insufficient documentation

## 2020-03-11 DIAGNOSIS — E538 Deficiency of other specified B group vitamins: Secondary | ICD-10-CM | POA: Insufficient documentation

## 2020-03-11 DIAGNOSIS — R911 Solitary pulmonary nodule: Secondary | ICD-10-CM

## 2020-03-12 ENCOUNTER — Encounter: Payer: Self-pay | Admitting: *Deleted

## 2020-03-12 DIAGNOSIS — J449 Chronic obstructive pulmonary disease, unspecified: Secondary | ICD-10-CM | POA: Diagnosis not present

## 2020-03-12 DIAGNOSIS — R269 Unspecified abnormalities of gait and mobility: Secondary | ICD-10-CM | POA: Diagnosis not present

## 2020-03-12 DIAGNOSIS — G4733 Obstructive sleep apnea (adult) (pediatric): Secondary | ICD-10-CM | POA: Diagnosis not present

## 2020-03-12 DIAGNOSIS — R911 Solitary pulmonary nodule: Secondary | ICD-10-CM | POA: Diagnosis not present

## 2020-03-12 DIAGNOSIS — J45909 Unspecified asthma, uncomplicated: Secondary | ICD-10-CM | POA: Diagnosis not present

## 2020-03-12 NOTE — Progress Notes (Signed)
[facility] Psychosocial Distress Screening Clinical Social Work  Clinical Social Work was referred by distress screening protocol.  The patient scored a 6 on the Psychosocial Distress Thermometer which indicates moderate distress.  Patient declined follow up from clinical social work.    ONCBCN DISTRESS SCREENING 03/11/2020  Screening Type Initial Screening  Distress experienced in past week (1-10) 6  Other dx, declines social work referral    Johnnye Lana, MSW, LCSW, OSW-C Clinical Social Worker [employer] [phone]

## 2020-03-12 NOTE — Progress Notes (Signed)
Internal Medicine Clinic Attending  Case discussed with Dr. Coe  At the time of the visit.  We reviewed the resident's history and exam and pertinent patient test results.  I agree with the assessment, diagnosis, and plan of care documented in the resident's note.  

## 2020-03-13 ENCOUNTER — Inpatient Hospital Stay (HOSPITAL_COMMUNITY): Admission: RE | Admit: 2020-03-13 | Payer: Medicare Other | Source: Ambulatory Visit

## 2020-03-13 ENCOUNTER — Ambulatory Visit: Payer: Medicare Other

## 2020-03-13 ENCOUNTER — Telehealth: Payer: Self-pay | Admitting: Pulmonary Disease

## 2020-03-13 DIAGNOSIS — R911 Solitary pulmonary nodule: Secondary | ICD-10-CM

## 2020-03-13 DIAGNOSIS — J4541 Moderate persistent asthma with (acute) exacerbation: Secondary | ICD-10-CM

## 2020-03-13 DIAGNOSIS — I10 Essential (primary) hypertension: Secondary | ICD-10-CM

## 2020-03-13 DIAGNOSIS — I5032 Chronic diastolic (congestive) heart failure: Secondary | ICD-10-CM

## 2020-03-13 NOTE — Chronic Care Management (AMB) (Signed)
  Care Management   Follow Up Note   03/13/2020 Name: Shannon [name] MRN: [id number] DOB: July 11, 1956  Referred by: [name], MD Reason for referral : Care Coordination (PCS, DME)   Shannon [name] is a 64 y.o. year old female who is a primary care patient of [name], MD. The care management team was consulted for assistance with care management and care coordination needs.    Review of patient status, including review of consultants reports, relevant laboratory and other test results, and collaboration with appropriate care team members and the patient's provider was performed as part of comprehensive patient evaluation and provision of chronic care management services.    SDOH (Social Determinants of Health) assessments performed: No See Care Plan activities for detailed interventions related to [facility])     Advanced Directives: See Care Plan and Vynca application for related entries.   Goals Addressed              This Visit's Progress   .  "I have just been diagnosed with lung cancer, I live alone in a 2 story townhouse, I have no local family and before I start treatment I am going to need help in my home and a hospital bed." (pt-stated)        [name] (see longitudinal plan of care for additional care plan information)  Current Barriers:  . Chronic Disease Management support, education, and care coordination needs related to CAD, HTN, HLD, COPD, CKD Stage 3, Anxiety, and Asthma- called patient to follow up on delivery of hospital bed and her c/o sinus infection, bronchitis and increasing dyspnea that she reported to [facility] earlier today. Patient states she is leaving for an appointment and has limited time to talk  Clinical Goal(s) related to CAD, HTN, HLD, COPD, CKD Stage 3, Anxiety, and Asthma:  Over the next 30 days, patient will:  . Work with the care management team to address educational, disease management, and care coordination needs   . Begin or continue self health monitoring activities as directed today  work with care management team to secure DME and home care assistance . Call provider office for new or worsened signs and symptoms CAD, HTN, HLD, COPD, CKD Stage 3, Anxiety, and Asthma . Call care management team with questions or concerns . Verbalize basic understanding of patient centered plan of care established today  Interventions related to CAD, HTN, HLD, COPD, CKD Stage 3, Anxiety, and Asthma:  . Confirmed that patient received hospital bed.  [name] Contacted patient regarding status of request for PCS.  Patient assessed on 03/05/20 and selected [facility] as service provider . [place]; [facility] unable to accommodate.  Patient will receive letter including list of other agencies to choose from . Called patient back to inform her of this but had to leave message   Please see past updates related to this goal by clicking on the "Past Updates" button in the selected goal          Will reach out to patient again next week if no return call.      Ronn Melena, [place] Coordination Social Worker [place] [phone]

## 2020-03-13 NOTE — Telephone Encounter (Signed)
LMTCB there is no message in chart that anyone from this office had called patient.

## 2020-03-13 NOTE — Patient Instructions (Signed)
Visit Information  Goals Addressed              This Visit's Progress   .  "I have just been diagnosed with lung cancer, I live alone in a 2 story townhouse, I have no local family and before I start treatment I am going to need help in my home and a hospital bed." (pt-stated)        Shannon [name] (see longitudinal plan of care for additional care plan information)  Current Barriers:  . Chronic Disease Management support, education, and care coordination needs related to CAD, HTN, HLD, COPD, CKD Stage 3, Anxiety, and Asthma- called patient to follow up on delivery of hospital bed and her c/o sinus infection, bronchitis and increasing dyspnea that she reported to [facility] earlier today. Patient states she is leaving for an appointment and has limited time to talk  Clinical Goal(s) related to CAD, HTN, HLD, COPD, CKD Stage 3, Anxiety, and Asthma:  Over the next 30 days, patient will:  . Work with the care management team to address educational, disease management, and care coordination needs  . Begin or continue self health monitoring activities as directed today  work with care management team to secure DME and home care assistance . Call provider office for new or worsened signs and symptoms CAD, HTN, HLD, COPD, CKD Stage 3, Anxiety, and Asthma . Call care management team with questions or concerns . Verbalize basic understanding of patient centered plan of care established today  Interventions related to CAD, HTN, HLD, COPD, CKD Stage 3, Anxiety, and Asthma:  . Confirmed that patient received hospital bed.  [name] Contacted patient regarding status of request for PCS.  Patient assessed on 03/05/20 and selected [facility] as service provider . [place]; [facility] unable to accommodate.  Patient will receive letter including list of other agencies to choose from . Called patient back to inform her of this but had to leave message   Please see past updates related to  this goal by clicking on the "Past Updates" button in the selected goal         Patient verbalizes understanding of instructions provided today.    Will reach out to patient again next week if no return call.     Ronn Melena, [place] Coordination Social Worker [place] [phone]

## 2020-03-13 NOTE — Progress Notes (Signed)
Internal Medicine Clinic Resident  I have personally reviewed this encounter including the documentation in this note and/or discussed this patient with the care management provider. I will address any urgent items identified by the care management provider and will communicate my actions to the patient's PCP. I have reviewed the patient's CCM visit with my supervising attending, Dr Hoffman.  Gevorg Brum D Matisyn Cabeza, DO 03/13/2020    

## 2020-03-15 ENCOUNTER — Inpatient Hospital Stay (HOSPITAL_COMMUNITY): Admission: RE | Admit: 2020-03-15 | Payer: Medicare Other | Source: Ambulatory Visit

## 2020-03-15 NOTE — Telephone Encounter (Signed)
Patient called regarding PFT scheduled for today, nothing further needed. PFT scheduled at [facility].

## 2020-03-18 ENCOUNTER — Ambulatory Visit: Payer: Medicare Other | Admitting: *Deleted

## 2020-03-18 ENCOUNTER — Telehealth: Payer: Self-pay | Admitting: Cardiovascular Disease

## 2020-03-18 DIAGNOSIS — I10 Essential (primary) hypertension: Secondary | ICD-10-CM

## 2020-03-18 DIAGNOSIS — I5032 Chronic diastolic (congestive) heart failure: Secondary | ICD-10-CM

## 2020-03-18 DIAGNOSIS — C3492 Malignant neoplasm of unspecified part of left bronchus or lung: Secondary | ICD-10-CM

## 2020-03-18 DIAGNOSIS — J4541 Moderate persistent asthma with (acute) exacerbation: Secondary | ICD-10-CM

## 2020-03-18 DIAGNOSIS — N183 Chronic kidney disease, stage 3 unspecified: Secondary | ICD-10-CM

## 2020-03-18 NOTE — Telephone Encounter (Signed)
Patient has recently been diagnosed with lung cancer. She would like to speak with Rip Harbour about getting an appointment to see Dr. Oval Linsey ASAP. The patient can not wait until September to see her

## 2020-03-18 NOTE — Telephone Encounter (Signed)
Scheduled follow up appointment with Dr Oval Linsey so she could discuss things that have been going in since last ov and to make sure heart ok for everything coming up Patient aware of date and time of appointment

## 2020-03-18 NOTE — Patient Instructions (Signed)
Visit Information I will contact Amber, the CCM social worker, and she will help you secure transportation to a drive through Covid testing site.  Goals Addressed              This Visit's Progress     Patient Stated   .  " I need to have another drive through Covid test so I can have another set of lung tests and I can't use [facility] or Medicaid transportation. " (pt-stated)        CARE PLAN ENTRY (see longitudinal plan of care for additional care plan information)  Current Barriers:  . Transportation barriers- call transferred from clinic to this CCM RN- patient states she needs to have another drive through Covid test so she can have additional lung tests and she was told by both [facility] [place] and Medicaid transportation that they will not transport her  Nurse Case Manager Clinical Goal(s):  [name] Over the next 4 days, patient will work with CCM  social worker to resolve transportation barrier to [employer] drive through test site.   Interventions:  . Inter-disciplinary care team collaboration (see longitudinal plan of care) . Social Work referral for transportation issue - Health and safety inspector CCM BSW  Patient Self Care Activities:  . Patient verbalizes understanding of plan to wok with CCM BSW to resolve transportation barrier . Unable to independently drive to Covid drive through testing site  Initial goal documentation        The patient verbalized understanding of instructions provided today and declined a print copy of patient instruction materials.   The care management team will reach out to the patient again over the next 3-4 days.   Kelli Churn RN, CCM, [facility] RN Care Manager [phone]

## 2020-03-18 NOTE — Chronic Care Management (AMB) (Signed)
Chronic Care Management   Follow Up Note   03/18/2020 Name: Shannon [name] MRN: [id number] DOB: 08-03-1956  Referred by: [name], MD Reason for referral : Chronic Care Management (HTN,CKD, HLD, Asthma, HTN, lung Ca)   Shannon [name] is a 64 y.o. year old female who is a primary care patient of [name], MD. The CCM team was consulted for assistance with chronic disease management and care coordination needs.    Review of patient status, including review of consultants reports, relevant laboratory and other test results, and collaboration with appropriate care team members and the patient's provider was performed as part of comprehensive patient evaluation and provision of chronic care management services.    SDOH (Social Determinants of Health) assessments performed: No See Care Plan activities for detailed interventions related to [facility])     Outpatient Encounter Medications as of 03/18/2020  Medication Sig  . acetaminophen (TYLENOL) 325 MG tablet Take 325 mg by mouth every 6 (six) hours as needed (FOR PAIN).  [name] albuterol (PROVENTIL) (2.5 MG/3ML) 0.083% nebulizer solution Take 3 mLs (2.5 mg total) by nebulization every 6 (six) hours as needed for wheezing or shortness of breath. USE 1 VIAL VIA NEBULIZER EVERY 6 HOURS AS NEEDED FOR WHEEZING OR SHORTNESS OF BREATH  . Ascorbic Acid (VITAMIN C) 1000 MG tablet Take 1,000 mg by mouth every other day.   . b complex vitamins tablet Take 1 tablet by mouth daily.  . benzonatate (TESSALON PERLES) 100 MG capsule Take 1 capsule (100 mg total) by mouth 3 (three) times daily as needed for cough.  . bictegravir-emtricitabine-tenofovir AF (BIKTARVY) 50-200-25 MG TABS tablet Take 1 tablet by mouth daily.  [name] BIOTIN PO Take 1 tablet by mouth daily as needed (for supplementation).   . calcium carbonate (OSCAL) 1500 (600 Ca) MG TABS tablet Take 600 mg of elemental calcium by mouth daily with breakfast.  . Cholecalciferol (SM VITAMIN D3) 100 MCG  (4000 UT) CAPS Take 1 capsule (4,000 Units total) by mouth daily.  . cloNIDine (CATAPRES - DOSED IN MG/24 HR) 0.3 mg/24hr patch Place 0.3 mg onto the skin once a week.   . cyanocobalamin (,VITAMIN B-12,) 1000 MCG/ML injection ADMINISTER 1 ML(1000 MCG) IN THE MUSCLE EVERY 30 DAYS  . cyanocobalamin (,VITAMIN B-12,) 1000 MCG/ML injection Inject 1 mL (1,000 mcg total) into the muscle every 30 (thirty) days. ADMINISTER 1 ML(1000 MCG) IN THE MUSCLE EVERY 30 DAYS  . diclofenac Sodium (VOLTAREN) 1 % GEL Apply 2 g topically 4 (four) times daily.  [name] diltiazem (CARDIZEM CD) 240 MG 24 hr capsule TAKE 1 CAPSULE(240 MG) BY MOUTH DAILY (Patient taking differently: Take 240 mg by mouth daily. )  . fluticasone (FLONASE) 50 MCG/ACT nasal spray Place 2 sprays into both nostrils daily as needed for allergies.  . fluticasone furoate-vilanterol (BREO ELLIPTA) 200-25 MCG/INH AEPB Inhale 1 puff into the lungs daily.  . furosemide (LASIX) 20 MG tablet Take 1 tablet (20 mg total) by mouth every other day.  [name] MAGNESIUM PO Take 1 tablet by mouth every other day.   . Melatonin 5 MG TABS Take 5 mg by mouth at bedtime as needed (for sleep).  . Misc. Devices (PULSE OXIMETER FOR FINGER) MISC 1 Units by Does not apply route as needed.  . Mouthwashes (BIOTENE DRY MOUTH GENTLE) LIQD Use as directed 1 Dose in the mouth or throat 2 (two) times daily as needed (dry mouth).  . Omega-3 Fatty Acids (FISH OIL) 1000 MG CAPS Take 1,000  mg by mouth daily as needed (pt prefrence).  [name] omeprazole (PRILOSEC) 40 MG capsule Take 1 capsule (40 mg total) by mouth daily as needed (heart burn).  . OXYGEN Inhale 1.5-2 L into the lungs See admin instructions. Uses at bedtime and through the day as needed  . polyethylene glycol (MIRALAX / GLYCOLAX) 17 g packet Take 17 g by mouth daily as needed for mild constipation.  [name] PREVIDENT 5000 SENSITIVE 1.1-5 % PSTE Use daily as directed  . PROAIR HFA 108 (90 Base) MCG/ACT inhaler Inhale 2 puffs into the lungs  every 4 (four) hours as needed for wheezing or shortness of breath. INHALE 2 PUFFS INTO THE LUNGS EVERY 4 HOURS AS NEEDED FOR WHEEZING OR SHORTNESS OF BREATH  . rivaroxaban (XARELTO) 20 MG TABS tablet Take 1 tablet (20 mg total) by mouth daily with supper. Okay to restart this medication on Thursday, 02/15/2020.  . senna (SENOKOT) 8.6 MG tablet Take 1 tablet by mouth daily as needed for constipation.  [name] spironolactone (ALDACTONE) 25 MG tablet Take 1 tablet (25 mg total) by mouth daily.  [name] spironolactone (ALDACTONE) 25 MG tablet Take 0.5 tablets (12.5 mg total) by mouth daily.  . SYRINGE/NEEDLE, DISP, 1 ML (B-D SYRINGE/NEEDLE 1CC/25GX5/8) 25G X 5/8" 1 ML MISC 1 Units by Does not apply route every 30 (thirty) days.  . vitamin A 3 MG (10000 UNITS) capsule Take 10,000 Units by mouth daily.  [name] zinc gluconate 50 MG tablet Take 50 mg by mouth daily.   No facility-administered encounter medications on file as of 03/18/2020.     Objective:  BP Readings from Last 3 Encounters:  03/11/20 (!) 190/90  03/06/20 (!) 171/103  03/01/20 133/84   Wt Readings from Last 3 Encounters:  03/06/20 232 lb 3.2 oz (105.3 kg)  03/01/20 229 lb (103.9 kg)  02/20/20 231 lb 1.6 oz (104.8 kg)    Goals Addressed              This Visit's Progress     Patient Stated   .  " I need to have another drive through Covid test so I can have another set of lung tests and I can't use [facility] [place] or Medicaid transportation. " (pt-stated)        CARE PLAN ENTRY (see longitudinal plan of care for additional care plan information)  Current Barriers:  . Transportation barriers- call transferred from clinic to this CCM RN- patient states she needs to have another drive through Covid test so she can have additional lung tests and she was told by both [facility] [place] and Medicaid transportation that they will not transport her  Nurse Case Manager Clinical Goal(s):  [name] Over the next 4 days, patient will work with CCM   social worker to resolve transportation barrier to [employer] drive through test site.   Interventions:  . Inter-disciplinary care team collaboration (see longitudinal plan of care) . Social Work referral for transportation issue - Health and safety inspector CCM BSW  Patient Self Care Activities:  . Patient verbalizes understanding of plan to wok with CCM BSW to resolve transportation barrier . Unable to independently drive to Covid drive through testing site  Initial goal documentation         Plan:   The care management team will reach out to the patient again over the next 3 days.    Kelli Churn RN, CCM, [facility] RN Care Manager [phone]

## 2020-03-19 ENCOUNTER — Telehealth: Payer: Self-pay

## 2020-03-19 ENCOUNTER — Ambulatory Visit
Admission: RE | Admit: 2020-03-19 | Discharge: 2020-03-19 | Disposition: A | Discharge status: Home / Self Care | Payer: Medicare Other | Source: Ambulatory Visit | Attending: Internal Medicine | Admitting: Internal Medicine

## 2020-03-19 ENCOUNTER — Other Ambulatory Visit (HOSPITAL_COMMUNITY)
Admission: RE | Admit: 2020-03-19 | Discharge: 2020-03-19 | Disposition: A | Discharge status: Home / Self Care | Payer: Medicare Other | Source: Ambulatory Visit | Attending: Radiology | Admitting: Radiology

## 2020-03-19 DIAGNOSIS — E041 Nontoxic single thyroid nodule: Secondary | ICD-10-CM

## 2020-03-19 DIAGNOSIS — R896 Abnormal cytological findings in specimens from other organs, systems and tissues: Secondary | ICD-10-CM | POA: Diagnosis not present

## 2020-03-19 NOTE — Progress Notes (Signed)
Internal Medicine Clinic Resident ° °I have personally reviewed this encounter including the documentation in this note and/or discussed this patient with the care management provider. I will address any urgent items identified by the care management provider and will communicate my actions to the patient's PCP. I have reviewed the patient's CCM visit with my supervising attending, Dr Hoffman. ° °Shae Hinnenkamp, MD  °[facility] PGY-2 °03/19/2020 ° ° ° °

## 2020-03-19 NOTE — Telephone Encounter (Signed)
Left message for patient regarding request for transportation assistance to upcoming drive thru [place], [place] Coordination Social Worker [place] [phone]

## 2020-03-20 ENCOUNTER — Ambulatory Visit: Payer: Medicare Other

## 2020-03-20 ENCOUNTER — Telehealth: Payer: Self-pay

## 2020-03-20 DIAGNOSIS — I5032 Chronic diastolic (congestive) heart failure: Secondary | ICD-10-CM

## 2020-03-20 DIAGNOSIS — N183 Chronic kidney disease, stage 3 unspecified: Secondary | ICD-10-CM

## 2020-03-20 DIAGNOSIS — I10 Essential (primary) hypertension: Secondary | ICD-10-CM

## 2020-03-20 LAB — CYTOLOGY - NON PAP

## 2020-03-20 NOTE — Chronic Care Management (AMB) (Signed)
  Care Management   Follow Up Note   03/20/2020 Name: Shannon [name] MRN: [id number] DOB: [birth year]  Referred by: [name], MD Reason for referral : Care Coordination (Transportation )   Shannon [name] is a 64 y.o. year old female who is a primary care patient of [name], MD. The care management team was consulted for assistance with care management and care coordination needs.    Review of patient status, including review of consultants reports, relevant laboratory and other test results, and collaboration with appropriate care team members and the patient's provider was performed as part of comprehensive patient evaluation and provision of chronic care management services.    SDOH (Social Determinants of Health) assessments performed: No See Care Plan activities for detailed interventions related to [facility])     Advanced Directives: See Care Plan and Vynca application for related entries.   Goals Addressed              This Visit's Progress   .  " I need to have another drive through Covid test so I can have another set of lung tests and I can't use [facility] or Medicaid transportation. " (pt-stated)        [place] (see longitudinal plan of care for additional care plan information)  Current Barriers:  . Transportation barriers- call transferred from clinic to this CCM RN- patient states she needs to have another drive through Covid test so she can have additional lung tests and she was told by both [facility] [place] and Medicaid transportation that they will not transport her  Nurse Case Manager Clinical Goal(s):  [name] Over the next 4 days, patient will work with CCM  social worker to resolve transportation barrier to [employer] drive through test site.   Interventions:  . Inter-disciplinary care team collaboration (see longitudinal plan of care) . Contacted patient to confirm date, time, and location of COVID test; pt requested call back tomorrow.     Patient Self Care Activities:  . Patient verbalizes understanding of plan to wok with CCM BSW to resolve transportation barrier . Unable to independently drive to Covid drive through testing site  Please see past updates related to this goal by clicking on the "Past Updates" button in the selected goal          Telephone follow up appointment with care management team member scheduled for:03/21/20     Ronn Melena, [place] Coordination Social Worker [place] [phone]

## 2020-03-20 NOTE — Telephone Encounter (Signed)
Left message for patient on mobile number regarding request for transportation assistance to upcoming drive thru COVID test. Called home number as well; no answer or option to leave message.      Ronn Melena, [place] Coordination Social Worker [place] [phone]

## 2020-03-20 NOTE — Patient Instructions (Signed)
Visit Information  Goals Addressed              This Visit's Progress   .  " I need to have another drive through Covid test so I can have another set of lung tests and I can't use [facility] or Medicaid transportation. " (pt-stated)        CARE PLAN ENTRY (see longitudinal plan of care for additional care plan information)  Current Barriers:  . Transportation barriers- call transferred from clinic to this CCM RN- patient states she needs to have another drive through Covid test so she can have additional lung tests and she was told by both [facility] [place] and Medicaid transportation that they will not transport her  Nurse Case Manager Clinical Goal(s):  [name] Over the next 4 days, patient will work with CCM  social worker to resolve transportation barrier to [employer] drive through test site.   Interventions:  . Inter-disciplinary care team collaboration (see longitudinal plan of care) . Contacted patient to confirm date, time, and location of COVID test; pt requested call back tomorrow.    Patient Self Care Activities:  . Patient verbalizes understanding of plan to wok with CCM BSW to resolve transportation barrier . Unable to independently drive to Covid drive through testing site  Please see past updates related to this goal by clicking on the "Past Updates" button in the selected goal         Patient verbalizes understanding of instructions provided today.   Telephone follow up appointment with care management team member scheduled for:03/21/20      Ronn Melena, [place] Coordination Social Worker [place] [phone]

## 2020-03-21 ENCOUNTER — Ambulatory Visit: Payer: Medicare Other

## 2020-03-21 ENCOUNTER — Telehealth: Payer: Self-pay

## 2020-03-21 DIAGNOSIS — I5032 Chronic diastolic (congestive) heart failure: Secondary | ICD-10-CM

## 2020-03-21 DIAGNOSIS — J4541 Moderate persistent asthma with (acute) exacerbation: Secondary | ICD-10-CM

## 2020-03-21 DIAGNOSIS — I10 Essential (primary) hypertension: Secondary | ICD-10-CM

## 2020-03-21 NOTE — Patient Instructions (Signed)
Visit Information  Goals Addressed              This Visit's Progress   .  " I need to have another drive through Covid test so I can have another set of lung tests and I can't use [facility] or Medicaid transportation. " (pt-stated)        CARE PLAN ENTRY (see longitudinal plan of care for additional care plan information)  Current Barriers:  . Transportation barriers- call transferred from clinic to this CCM RN- patient states she needs to have another drive through Covid test so she can have additional lung tests and she was told by both [facility] [place] and Medicaid transportation that they will not transport her  Nurse Case Manager Clinical Goal(s):  [name] Over the next 4 days, patient will work with CCM  social worker to resolve transportation barrier to [employer] drive through test site.   Interventions:  . Inter-disciplinary care team collaboration (see longitudinal plan of care) . Request submitted to [facility] Transportation services for 03/27/20 @ 9:20 AM . Informed patient that she will contacted directly by [place] to confirm ride once it has been arranged.   Patient Self Care Activities:  . Patient verbalizes understanding of plan to wok with CCM BSW to resolve transportation barrier . Unable to independently drive to Covid drive through testing site  Please see past updates related to this goal by clicking on the "Past Updates" button in the selected goal         Patient verbalizes understanding of instructions provided today.   The patient has been provided with contact information for the care management team and has been advised to call with any health related questions or concerns.      Ronn Melena, [place] Coordination Social Worker [place] [phone]

## 2020-03-21 NOTE — Chronic Care Management (AMB) (Signed)
  Care Management   Follow Up Note   03/21/2020 Name: OTELIA HETTINGER MRN: [id number] DOB: [birth year]  Referred by: [name], MD Reason for referral : Care Coordination (Transportation )   Shannon MCCONICO is a 64 y.o. year old female who is a primary care patient of [name], MD. The care management team was consulted for assistance with care management and care coordination needs.    Review of patient status, including review of consultants reports, relevant laboratory and other test results, and collaboration with appropriate care team members and the patient's provider was performed as part of comprehensive patient evaluation and provision of chronic care management services.    SDOH (Social Determinants of Health) assessments performed: No See Care Plan activities for detailed interventions related to [facility])     Advanced Directives: See Care Plan and Vynca application for related entries.   Goals Addressed              This Visit's Progress   .  " I need to have another drive through Covid test so I can have another set of lung tests and I can't use [facility] or Medicaid transportation. " (pt-stated)        [place] (see longitudinal plan of care for additional care plan information)  Current Barriers:  . Transportation barriers- call transferred from clinic to this CCM RN- patient states she needs to have another drive through Covid test so she can have additional lung tests and she was told by both [facility] [place] and Medicaid transportation that they will not transport her  Nurse Case Manager Clinical Goal(s):  [name] Over the next 4 days, patient will work with CCM  social worker to resolve transportation barrier to [employer] drive through test site.   Interventions:  . Inter-disciplinary care team collaboration (see longitudinal plan of care) . Request submitted to [facility] Transportation services for 03/27/20 @ 9:20 AM  Patient Self Care Activities:   . Patient verbalizes understanding of plan to wok with CCM BSW to resolve transportation barrier . Unable to independently drive to Covid drive through testing site  Please see past updates related to this goal by clicking on the "Past Updates" button in the selected goal          The patient has been provided with contact information for the care management team and has been advised to call with any health related questions or concerns.    Ronn Melena, [place] Coordination Social Worker [place] [phone]

## 2020-03-21 NOTE — Telephone Encounter (Signed)
Attempted to contact patient, as requested, regarding need for transportation assistance. Left message requesting return call.    Ronn Melena, [place] Coordination Social Worker [employer] [phone]

## 2020-03-22 ENCOUNTER — Encounter: Payer: Self-pay | Admitting: *Deleted

## 2020-03-22 NOTE — Progress Notes (Signed)
Internal Medicine Clinic Attending  CCM services provided by the care management provider and their documentation were discussed with Dr. Konrad Penta. We reviewed the pertinent findings, urgent action items addressed by the resident and non-urgent items to be addressed by the PCP.  I agree with the assessment, diagnosis, and plan of care documented in the CCM and resident's note.  Axel Filler, MD 03/22/2020

## 2020-03-22 NOTE — Progress Notes (Signed)
Internal Medicine Clinic Attending  CCM services provided by the care management provider and their documentation were discussed with Dr. Bloomfield. We reviewed the pertinent findings, urgent action items addressed by the resident and non-urgent items to be addressed by the PCP.  I agree with the assessment, diagnosis, and plan of care documented in the CCM and resident's note.  Carlas Vandyne C Nyeshia Mysliwiec, DO 03/22/2020  

## 2020-03-22 NOTE — Progress Notes (Signed)
Internal Medicine Clinic Attending  CCM services provided by the care management provider and their documentation were discussed with Dr. Aslam. We reviewed the pertinent findings, urgent action items addressed by the resident and non-urgent items to be addressed by the PCP.  I agree with the assessment, diagnosis, and plan of care documented in the CCM and resident's note.  Kuper Rennels C Seferina Brokaw, DO 03/22/2020  

## 2020-03-22 NOTE — Progress Notes (Signed)
Internal Medicine Clinic Resident  I have personally reviewed this encounter including the documentation in this note and/or discussed this patient with the care management provider. I will address any urgent items identified by the care management provider and will communicate my actions to the patient's PCP. I have reviewed the patient's CCM visit with my supervising attending, Dr Evette Doffing.  Jeralyn Bennett, MD 03/22/2020

## 2020-03-26 ENCOUNTER — Encounter: Payer: Self-pay | Admitting: Internal Medicine

## 2020-03-26 ENCOUNTER — Other Ambulatory Visit: Payer: Self-pay

## 2020-03-26 ENCOUNTER — Ambulatory Visit (INDEPENDENT_AMBULATORY_CARE_PROVIDER_SITE_OTHER): Payer: Medicare Other | Admitting: Internal Medicine

## 2020-03-26 ENCOUNTER — Ambulatory Visit
Admission: RE | Admit: 2020-03-26 | Discharge: 2020-03-26 | Disposition: A | Discharge status: Home / Self Care | Payer: Medicare Other | Source: Ambulatory Visit | Attending: Internal Medicine | Admitting: Internal Medicine

## 2020-03-26 VITALS — BP 138/88 | HR 101 | Temp 97.8°F | Wt 231.8 lb

## 2020-03-26 DIAGNOSIS — K0889 Other specified disorders of teeth and supporting structures: Secondary | ICD-10-CM

## 2020-03-26 DIAGNOSIS — N183 Chronic kidney disease, stage 3 unspecified: Secondary | ICD-10-CM | POA: Diagnosis not present

## 2020-03-26 DIAGNOSIS — J9811 Atelectasis: Secondary | ICD-10-CM | POA: Diagnosis not present

## 2020-03-26 DIAGNOSIS — R918 Other nonspecific abnormal finding of lung field: Secondary | ICD-10-CM

## 2020-03-26 DIAGNOSIS — R0602 Shortness of breath: Secondary | ICD-10-CM

## 2020-03-26 DIAGNOSIS — R0600 Dyspnea, unspecified: Secondary | ICD-10-CM | POA: Diagnosis not present

## 2020-03-26 DIAGNOSIS — M47814 Spondylosis without myelopathy or radiculopathy, thoracic region: Secondary | ICD-10-CM | POA: Diagnosis not present

## 2020-03-26 DIAGNOSIS — E041 Nontoxic single thyroid nodule: Secondary | ICD-10-CM | POA: Diagnosis not present

## 2020-03-26 DIAGNOSIS — B20 Human immunodeficiency virus [HIV] disease: Secondary | ICD-10-CM | POA: Diagnosis not present

## 2020-03-26 NOTE — Progress Notes (Signed)
Patient ID: Shannon [name], female   DOB: [birth year], 64 y.o.   MRN: [id number]  HPI Shannon [name] is starting to feel tired and states that she is ready to get radiation for left upper lung adenoca. She has noticed that she does  Feel different pattern in chest discomfort - with inhalation. Feeling more right sided discomfort at rest not chest pain it doesn't radiate. She denies fever, chills, nightsweats, she also inquired about the path results of thyroid biopsy  Radiation starts Tuesday. Has PFT on thursday Outpatient Encounter Medications as of 03/26/2020  Medication Sig  . acetaminophen (TYLENOL) 325 MG tablet Take 325 mg by mouth every 6 (six) hours as needed (FOR PAIN).  [name] albuterol (PROVENTIL) (2.5 MG/3ML) 0.083% nebulizer solution Take 3 mLs (2.5 mg total) by nebulization every 6 (six) hours as needed for wheezing or shortness of breath. USE 1 VIAL VIA NEBULIZER EVERY 6 HOURS AS NEEDED FOR WHEEZING OR SHORTNESS OF BREATH  . Ascorbic Acid (VITAMIN C) 1000 MG tablet Take 1,000 mg by mouth every other day.   . b complex vitamins tablet Take 1 tablet by mouth daily.  . benzonatate (TESSALON PERLES) 100 MG capsule Take 1 capsule (100 mg total) by mouth 3 (three) times daily as needed for cough.  . bictegravir-emtricitabine-tenofovir AF (BIKTARVY) 50-200-25 MG TABS tablet Take 1 tablet by mouth daily.  [name] BIOTIN PO Take 1 tablet by mouth daily as needed (for supplementation).   . calcium carbonate (OSCAL) 1500 (600 Ca) MG TABS tablet Take 600 mg of elemental calcium by mouth daily with breakfast.  . Cholecalciferol (SM VITAMIN D3) 100 MCG (4000 UT) CAPS Take 1 capsule (4,000 Units total) by mouth daily.  . cloNIDine (CATAPRES - DOSED IN MG/24 HR) 0.3 mg/24hr patch Place 0.3 mg onto the skin once a week.   . cyanocobalamin (,VITAMIN B-12,) 1000 MCG/ML injection Inject 1 mL (1,000 mcg total) into the muscle every 30 (thirty) days. ADMINISTER 1 ML(1000 MCG) IN THE MUSCLE EVERY 30 DAYS  .  diclofenac Sodium (VOLTAREN) 1 % GEL Apply 2 g topically 4 (four) times daily.  [name] diltiazem (CARDIZEM CD) 240 MG 24 hr capsule TAKE 1 CAPSULE(240 MG) BY MOUTH DAILY (Patient taking differently: Take 240 mg by mouth daily. )  . fluticasone (FLONASE) 50 MCG/ACT nasal spray Place 2 sprays into both nostrils daily as needed for allergies.  . fluticasone furoate-vilanterol (BREO ELLIPTA) 200-25 MCG/INH AEPB Inhale 1 puff into the lungs daily.  . furosemide (LASIX) 20 MG tablet Take 1 tablet (20 mg total) by mouth every other day.  [name] MAGNESIUM PO Take 1 tablet by mouth every other day.   . Melatonin 5 MG TABS Take 5 mg by mouth at bedtime as needed (for sleep).  . Misc. Devices (PULSE OXIMETER FOR FINGER) MISC 1 Units by Does not apply route as needed.  . Mouthwashes (BIOTENE DRY MOUTH GENTLE) LIQD Use as directed 1 Dose in the mouth or throat 2 (two) times daily as needed (dry mouth).  . Omega-3 Fatty Acids (FISH OIL) 1000 MG CAPS Take 1,000 mg by mouth daily as needed (pt prefrence).  [name] omeprazole (PRILOSEC) 40 MG capsule Take 1 capsule (40 mg total) by mouth daily as needed (heart burn).  . OXYGEN Inhale 1.5-2 L into the lungs See admin instructions. Uses at bedtime and through the day as needed  . polyethylene glycol (MIRALAX / GLYCOLAX) 17 g packet Take 17 g by mouth daily as needed for mild constipation.  [name]  PREVIDENT 5000 SENSITIVE 1.1-5 % PSTE Use daily as directed  . PROAIR HFA 108 (90 Base) MCG/ACT inhaler Inhale 2 puffs into the lungs every 4 (four) hours as needed for wheezing or shortness of breath. INHALE 2 PUFFS INTO THE LUNGS EVERY 4 HOURS AS NEEDED FOR WHEEZING OR SHORTNESS OF BREATH  . rivaroxaban (XARELTO) 20 MG TABS tablet Take 1 tablet (20 mg total) by mouth daily with supper. Okay to restart this medication on Thursday, 02/15/2020.  . senna (SENOKOT) 8.6 MG tablet Take 1 tablet by mouth daily as needed for constipation.  [name] spironolactone (ALDACTONE) 25 MG tablet Take 0.5 tablets (12.5  mg total) by mouth daily.  . SYRINGE/NEEDLE, DISP, 1 ML (B-D SYRINGE/NEEDLE 1CC/25GX5/8) 25G X 5/8" 1 ML MISC 1 Units by Does not apply route every 30 (thirty) days.  . vitamin A 3 MG (10000 UNITS) capsule Take 10,000 Units by mouth daily.  [name] zinc gluconate 50 MG tablet Take 50 mg by mouth daily.  . cyanocobalamin (,VITAMIN B-12,) 1000 MCG/ML injection ADMINISTER 1 ML(1000 MCG) IN THE MUSCLE EVERY 30 DAYS  . spironolactone (ALDACTONE) 25 MG tablet Take 1 tablet (25 mg total) by mouth daily.   No facility-administered encounter medications on file as of 03/26/2020.     Patient Active Problem List   Diagnosis Date Noted  . Bronchitis 03/07/2020  . Costochondritis, acute 03/07/2020  . Pulmonary nodule 02/14/2020  . Nodule of upper lobe of left lung 02/06/2020  . Thyroid nodule 01/26/2020  . Adenocarcinoma of lung ([place]) 01/04/2020  . Chronic obstructive asthma ([place]) 12/29/2019  . Myalgia 10/24/2019  . (HFpEF) heart failure with preserved ejection fraction ([place]) 02/07/2019  . Leg swelling 11/03/2018  . Other fatigue 06/30/2018  . Shortness of breath on exertion 06/30/2018  . Hyperglycemia 06/30/2018  . Vitamin D deficiency 06/30/2018  . Chronic anticoagulation 01/03/2018  . Constipation 11/03/2017  . Atrial flutter ([place]) 03/11/2017  . CAD (coronary artery disease) 01/28/2017  . Obesity (BMI 30-39.9) 01/13/2017  . Ascending aorta dilatation ([facility]) 01/07/2017  . Low back pain radiating to right lower extremity 12/02/2016  . Acquired cyst of kidney 12/02/2016  . Chronic kidney disease (CKD), stage III (moderate) 12/01/2016  . Generalized anxiety disorder 10/22/2016  . Hypersomnia 10/01/2016  . Accessory skin tags 06/10/2016  . Nocturnal hypoxemia 05/13/2016  . Cervical radiculopathy 04/14/2016  . GERD (gastroesophageal reflux disease) 01/02/2016  . Vitamin B12 deficiency 10/02/2015  . Moderate persistent asthma with exacerbation 07/09/2015  . HIV disease ([place]) 07/09/2015  .  Uncontrolled hypertension 07/09/2015     Health Maintenance Due  Topic Date Due  . TETANUS/TDAP  Never done  . COLONOSCOPY  Never done  . MAMMOGRAM  03/26/2018  . DEXA SCAN  Never done  . PNA vac Low Risk Adult (1 of 2 - PCV13) Never done  . COLON CANCER SCREENING ANNUAL FOBT  05/06/2019  . INFLUENZA VACCINE  03/24/2020      Social History   Tobacco Use  . Smoking status: Never Smoker  . Smokeless tobacco: Never Used  Vaping Use  . Vaping Use: Never used  Substance Use Topics  . Alcohol use: Not Currently    Alcohol/week: 0.0 standard drinks    Comment: Rarely.  . Drug use: No   Review of Systems 12 point ros is negative except what is mentioned in hpi, also reports loosening of lower teeth Physical Exam   BP 138/88   Pulse (!) 101   Temp 97.8 F (36.6 C) (Oral)  Wt 231 lb 12.8 oz (105.1 kg)   SpO2 91%   BMI 40.42 kg/m   Physical Exam  Constitutional:  oriented to person, place, and time. appears well-developed and well-nourished. No distress.  HENT: [place]/AT, PERRLA, no scleral icterus Mouth/Throat: Oropharynx is clear and moist. No oropharyngeal exudate.  Cardiovascular: Normal rate, regular rhythm and normal heart sounds. Exam reveals no gallop and no friction rub.  No murmur heard.  Pulmonary/Chest: Effort normal and breath sounds normal. No respiratory distress.  has no wheezes.  Neck = supple, no nuchal rigidity Abdominal: Soft. Bowel sounds are normal.  exhibits no distension. There is no tenderness.  Lymphadenopathy: no cervical adenopathy. No axillary adenopathy Neurological: alert and oriented to person, place, and time.  Skin: Skin is warm and dry. No rash noted. No erythema.  Psychiatric: a normal mood and affect.  behavior is normal.   Lab Results  Component Value Date   CD4TCELL 45 11/10/2019   Lab Results  Component Value Date   CD4TABS 669 11/10/2019   CD4TABS 530 09/07/2019   CD4TABS 463 03/14/2019   Lab Results  Component Value Date    HIV1RNAQUANT <20 NOT DETECTED 11/10/2019   No results found for: HEPBSAB Lab Results  Component Value Date   LABRPR NON-REACTIVE 11/10/2019    CBC Lab Results  Component Value Date   WBC 8.1 02/20/2020   RBC 4.20 02/20/2020   HGB 12.8 02/20/2020   HCT 40.1 02/20/2020   PLT 203 02/20/2020   MCV 95.5 02/20/2020   MCH 30.5 02/20/2020   MCHC 31.9 02/20/2020   RDW 12.5 02/20/2020   LYMPHSABS 2.1 02/20/2020   MONOABS 0.6 02/20/2020   EOSABS 0.1 02/20/2020    BMET Lab Results  Component Value Date   NA 142 02/22/2020   K 4.6 02/22/2020   CL 100 02/22/2020   CO2 29 02/22/2020   GLUCOSE 106 (H) 02/22/2020   BUN 18 02/22/2020   CREATININE 1.17 (H) 02/22/2020   CALCIUM 10.3 02/22/2020   GFRNONAA 49 (L) 02/22/2020   GFRAA 57 (L) 02/22/2020      Assessment and Plan - can continue on biktarvy for now, unlikely to interact with radiation - will get chest xray - plan to call next week to see how she is tolerating radiation - needs teeth extraction -now on amoxicillin? Need follow up with oral surgeon, but mentioned that she will need pulmonary and cardiology clearance if they are going to do sedation for her  - discussed thyor]id biopsy results - atypical cells but still may need further work up  recommend to postpone teeth extraction until they do radiation

## 2020-03-27 ENCOUNTER — Ambulatory Visit: Payer: Medicare Other | Admitting: Cardiovascular Disease

## 2020-03-27 ENCOUNTER — Other Ambulatory Visit (HOSPITAL_COMMUNITY)
Admission: RE | Admit: 2020-03-27 | Discharge: 2020-03-27 | Disposition: A | Discharge status: Home / Self Care | Payer: Medicare Other | Source: Ambulatory Visit | Attending: Thoracic Surgery (Cardiothoracic Vascular Surgery) | Admitting: Thoracic Surgery (Cardiothoracic Vascular Surgery)

## 2020-03-27 DIAGNOSIS — Z01812 Encounter for preprocedural laboratory examination: Secondary | ICD-10-CM | POA: Insufficient documentation

## 2020-03-27 DIAGNOSIS — Z20822 Contact with and (suspected) exposure to covid-19: Secondary | ICD-10-CM | POA: Insufficient documentation

## 2020-03-27 LAB — SARS CORONAVIRUS 2 (TAT 6-24 HRS): SARS Coronavirus 2: NEGATIVE

## 2020-03-28 ENCOUNTER — Inpatient Hospital Stay (HOSPITAL_COMMUNITY): Admission: RE | Admit: 2020-03-28 | Payer: Medicare Other | Source: Ambulatory Visit

## 2020-03-29 ENCOUNTER — Ambulatory Visit: Payer: Medicare Other | Admitting: *Deleted

## 2020-03-29 DIAGNOSIS — J449 Chronic obstructive pulmonary disease, unspecified: Secondary | ICD-10-CM

## 2020-03-29 DIAGNOSIS — I2510 Atherosclerotic heart disease of native coronary artery without angina pectoris: Secondary | ICD-10-CM

## 2020-03-29 DIAGNOSIS — I10 Essential (primary) hypertension: Secondary | ICD-10-CM

## 2020-03-29 DIAGNOSIS — C3492 Malignant neoplasm of unspecified part of left bronchus or lung: Secondary | ICD-10-CM

## 2020-03-29 DIAGNOSIS — I5032 Chronic diastolic (congestive) heart failure: Secondary | ICD-10-CM

## 2020-03-29 NOTE — Patient Instructions (Signed)
Visit Information Please notify the CCM team of your benefits related to a lift chair so we can provide the necessary documentation.  Goals Addressed              This Visit's Progress     Patient Stated   .  "I have just been diagnosed with lung cancer, I live alone in a 2 story townhouse, I have no local family and before I start treatment I am going to need help in my home and a hospital bed." (pt-stated)        [place] (see longitudinal plan of care for additional care plan information)  Current Barriers:  . Chronic Disease Management support, education, and care coordination needs related to CAD, HTN, HLD, COPD, CKD Stage 3, Anxiety, and Asthma- patient states the hospital bed was delivered but it is too uncomfortable to sleep in, she is requesting assist with getting a lift chair, she says she will have a simulation appointment at the cancer center on 04/01/20 in preparation to start radiation therapy, she also says she has been advised to start drinking Ensure supplements and to stay well hydrated while receiving radiation therapy. She says she is receiving Mom's Meals. She also says she has not chosen at least 2 agencies to accommodate [employer] staffing at [facility] request but says she will decide and make the call to [facility] today  Clinical Goal(s) related to CAD, HTN, HLD, COPD, CKD Stage 3, Anxiety, and Asthma:  Over the next 30 days, patient will:  . Work with the care management team to address educational, disease management, and care coordination needs  . Begin or continue self health monitoring activities as directed today  work with care management team to secure DME and home care assistance . Call provider office for new or worsened signs and symptoms CAD, HTN, HLD, COPD, CKD Stage 3, Anxiety, and Asthma . Call care management team with questions or concerns . Verbalize basic understanding of patient centered plan of care established today  Interventions  related to CAD, HTN, HLD, COPD, CKD Stage 3, Anxiety, and Asthma:   . Reinforced the importance of notifying [facility] of her [facility] staffing agency choices other than [facility] . Requested patient call [facility] Medicare to determine her plan benefits related to lift chair . Inter-disciplinary care team collaboration (see longitudinal plan of care) . Evaluation of current treatment plan related to HTN, CAD, CKD, Asthma, COPD and lung ca and patient's adherence to plan as established by provider. . Reviewed medications with patient and discussed medication taking behavior . Discussed plans with patient for ongoing care management follow up and provided patient with direct contact information for care management tea    Please see past updates related to this goal by clicking on the "Past Updates" button in the selected goal         The patient verbalized understanding of instructions provided today and declined a print copy of patient instruction materials.   The care management team will reach out to the patient again over the next 30-60 days.   Kelli Churn RN, CCM, [facility] RN Care Manager [phone]

## 2020-03-29 NOTE — Chronic Care Management (AMB) (Signed)
Chronic Care Management   Follow Up Note   03/29/2020 Name: Shannon [name] MRN: [id number] DOB: 1955-10-11  Referred by: Shannon Falcon, MD Reason for referral : Chronic Care Management (HTN, HLD, COPD, HIV, Lung Ca)   Shannon [name] is a 64 y.o. year old female who is a primary care patient of Shannon Falcon, MD. The CCM team was consulted for assistance with chronic disease management and care coordination needs.    Review of patient status, including review of consultants reports, relevant laboratory and other test results, and collaboration with appropriate care team members and the patient's provider was performed as part of comprehensive patient evaluation and provision of chronic care management services.    SDOH (Social Determinants of Health) assessments performed: No See Care Plan activities for detailed interventions related to [facility])     Outpatient Encounter Medications as of 03/29/2020  Medication Sig  . acetaminophen (TYLENOL) 325 MG tablet Take 325 mg by mouth every 6 (six) hours as needed (FOR PAIN).  [name] albuterol (PROVENTIL) (2.5 MG/3ML) 0.083% nebulizer solution Take 3 mLs (2.5 mg total) by nebulization every 6 (six) hours as needed for wheezing or shortness of breath. USE 1 VIAL VIA NEBULIZER EVERY 6 HOURS AS NEEDED FOR WHEEZING OR SHORTNESS OF BREATH  . Ascorbic Acid (VITAMIN C) 1000 MG tablet Take 1,000 mg by mouth every other day.   . b complex vitamins tablet Take 1 tablet by mouth daily.  . benzonatate (TESSALON PERLES) 100 MG capsule Take 1 capsule (100 mg total) by mouth 3 (three) times daily as needed for cough.  . bictegravir-emtricitabine-tenofovir AF (BIKTARVY) 50-200-25 MG TABS tablet Take 1 tablet by mouth daily.  [name] BIOTIN PO Take 1 tablet by mouth daily as needed (for supplementation).   . calcium carbonate (OSCAL) 1500 (600 Ca) MG TABS tablet Take 600 mg of elemental calcium by mouth daily with breakfast.  . Cholecalciferol (SM VITAMIN D3) 100 MCG (4000 UT)  CAPS Take 1 capsule (4,000 Units total) by mouth daily.  . cloNIDine (CATAPRES - DOSED IN MG/24 HR) 0.3 mg/24hr patch Place 0.3 mg onto the skin once a week.   . cyanocobalamin (,VITAMIN B-12,) 1000 MCG/ML injection ADMINISTER 1 ML(1000 MCG) IN THE MUSCLE EVERY 30 DAYS  . cyanocobalamin (,VITAMIN B-12,) 1000 MCG/ML injection Inject 1 mL (1,000 mcg total) into the muscle every 30 (thirty) days. ADMINISTER 1 ML(1000 MCG) IN THE MUSCLE EVERY 30 DAYS  . diclofenac Sodium (VOLTAREN) 1 % GEL Apply 2 g topically 4 (four) times daily.  [name] diltiazem (CARDIZEM CD) 240 MG 24 hr capsule TAKE 1 CAPSULE(240 MG) BY MOUTH DAILY (Patient taking differently: Take 240 mg by mouth daily. )  . fluticasone (FLONASE) 50 MCG/ACT nasal spray Place 2 sprays into both nostrils daily as needed for allergies.  . fluticasone furoate-vilanterol (BREO ELLIPTA) 200-25 MCG/INH AEPB Inhale 1 puff into the lungs daily.  . furosemide (LASIX) 20 MG tablet Take 1 tablet (20 mg total) by mouth every other day.  [name] MAGNESIUM PO Take 1 tablet by mouth every other day.   . Melatonin 5 MG TABS Take 5 mg by mouth at bedtime as needed (for sleep).  . Misc. Devices (PULSE OXIMETER FOR FINGER) MISC 1 Units by Does not apply route as needed.  . Mouthwashes (BIOTENE DRY MOUTH GENTLE) LIQD Use as directed 1 Dose in the mouth or throat 2 (two) times daily as needed (dry mouth).  . Omega-3 Fatty Acids (FISH OIL) 1000 MG CAPS Take 1,000  mg by mouth daily as needed (pt prefrence).  [name] omeprazole (PRILOSEC) 40 MG capsule Take 1 capsule (40 mg total) by mouth daily as needed (heart burn).  . OXYGEN Inhale 1.5-2 L into the lungs See admin instructions. Uses at bedtime and through the day as needed  . polyethylene glycol (MIRALAX / GLYCOLAX) 17 g packet Take 17 g by mouth daily as needed for mild constipation.  [name] PREVIDENT 5000 SENSITIVE 1.1-5 % PSTE Use daily as directed  . PROAIR HFA 108 (90 Base) MCG/ACT inhaler Inhale 2 puffs into the lungs every 4 (four)  hours as needed for wheezing or shortness of breath. INHALE 2 PUFFS INTO THE LUNGS EVERY 4 HOURS AS NEEDED FOR WHEEZING OR SHORTNESS OF BREATH  . rivaroxaban (XARELTO) 20 MG TABS tablet Take 1 tablet (20 mg total) by mouth daily with supper. Okay to restart this medication on Thursday, 02/15/2020.  . senna (SENOKOT) 8.6 MG tablet Take 1 tablet by mouth daily as needed for constipation.  [name] spironolactone (ALDACTONE) 25 MG tablet Take 1 tablet (25 mg total) by mouth daily.  [name] spironolactone (ALDACTONE) 25 MG tablet Take 0.5 tablets (12.5 mg total) by mouth daily.  . SYRINGE/NEEDLE, DISP, 1 ML (B-D SYRINGE/NEEDLE 1CC/25GX5/8) 25G X 5/8" 1 ML MISC 1 Units by Does not apply route every 30 (thirty) days.  . vitamin A 3 MG (10000 UNITS) capsule Take 10,000 Units by mouth daily.  [name] zinc gluconate 50 MG tablet Take 50 mg by mouth daily.   No facility-administered encounter medications on file as of 03/29/2020.     Objective:  Wt Readings from Last 3 Encounters:  03/26/20 231 lb 12.8 oz (105.1 kg)  03/06/20 232 lb 3.2 oz (105.3 kg)  03/01/20 229 lb (103.9 kg)   BP Readings from Last 3 Encounters:  03/26/20 138/88  03/11/20 (!) 190/90  03/06/20 (!) 171/103   Lab Results  Component Value Date   CHOL 249 (H) 11/10/2019   HDL 70 11/10/2019   LDLCALC 164 (H) 11/10/2019   TRIG 53 11/10/2019   CHOLHDL 3.6 11/10/2019    Goals Addressed              This Visit's Progress     Patient Stated   .  "I have just been diagnosed with lung cancer, I live alone in a 2 story townhouse, I have no local family and before I start treatment I am going to need help in my home and a hospital bed." (pt-stated)        [place] (see longitudinal plan of care for additional care plan information)  Current Barriers:  Chronic Disease Management support, education, and care coordination needs related to CAD, HTN, HLD, COPD, CKD Stage 3, Anxiety, and Asthma- patient states the hospital bed was delivered but  it is too uncomfortable to sleep in, she is requesting assist with getting a lift chair, she says she will have a simulation appointment at the cancer center on 04/01/20 in preparation to start radiation therapy, she also says she has been advised to start drinking Ensure supplements and to stay well hydrated while receiving radiation therapy. She says she is receiving Mom's Meals. She also says she has not chosen at least 2 agencies to accommodate [employer] staffing at [facility] request but says she will decide and make the call to [facility] today. Patient voiced much appreciation for the assistance of the CCM team.    Clinical Goal(s) related to CAD, HTN, HLD, COPD, CKD  Stage 3, Anxiety, and Asthma:  Over the next 30 days, patient will:  . Work with the care management team to address educational, disease management, and care coordination needs  . Begin or continue self health monitoring activities as directed today  work with care management team to secure DME and home care assistance . Call provider office for new or worsened signs and symptoms CAD, HTN, HLD, COPD, CKD Stage 3, Anxiety, and Asthma . Call care management team with questions or concerns . Verbalize basic understanding of patient centered plan of care established today  Interventions related to CAD, HTN, HLD, COPD, CKD Stage 3, Anxiety, and Asthma:   . Reinforced the importance of notifying [facility] of her [facility] staffing agency choices other than [facility] . Requested patient call [facility] Medicare to determine her plan benefits related to lift chair . Inter-disciplinary care team collaboration (see longitudinal plan of care) . Evaluation of current treatment plan related to HTN, CAD, CKD, Asthma, COPD and lung ca and patient's adherence to plan as established by provider. . Reviewed medications with patient and discussed medication taking behavior . Will mail Ensure coupons to patient's home address . Discussed plans with  patient for ongoing care management follow up and provided patient with direct contact information for care management tea    Please see past updates related to this goal by clicking on the "Past Updates" button in the selected goal          Plan:   The care management team will reach out to the patient again over the next 30-60 days.    Kelli Churn RN, CCM, [facility] RN Care Manager [phone]

## 2020-03-29 NOTE — Progress Notes (Signed)
Internal Medicine Clinic Resident  I have personally reviewed this encounter including the documentation in this note and/or discussed this patient with the care management provider. I will address any urgent items identified by the care management provider and will communicate my actions to the patient's PCP. I have reviewed the patient's CCM visit with my supervising attending, Dr Daryll Drown.  Virl Axe, MD 03/29/2020

## 2020-04-01 ENCOUNTER — Ambulatory Visit: Admission: RE | Admit: 2020-04-01 | Payer: Medicare Other | Source: Ambulatory Visit | Admitting: Radiation Oncology

## 2020-04-01 ENCOUNTER — Ambulatory Visit: Payer: Medicare Other

## 2020-04-01 ENCOUNTER — Telehealth: Payer: Self-pay

## 2020-04-01 DIAGNOSIS — I5032 Chronic diastolic (congestive) heart failure: Secondary | ICD-10-CM

## 2020-04-01 DIAGNOSIS — C3492 Malignant neoplasm of unspecified part of left bronchus or lung: Secondary | ICD-10-CM

## 2020-04-01 DIAGNOSIS — I10 Essential (primary) hypertension: Secondary | ICD-10-CM

## 2020-04-01 NOTE — Progress Notes (Signed)
Internal Medicine Clinic Resident  I have personally reviewed this encounter including the documentation in this note and/or discussed this patient with the care management provider. I will address any urgent items identified by the care management provider and will communicate my actions to the patient's PCP. I have reviewed the patient's CCM visit with my supervising attending, Dr Heber [place].  Per CCM, in order for patient to qualify for a lift to be placed, need to document IN THE NOTE  " The use of a lift is needed to transfer between bed and chair, wheelchair, or commode and without it the patient would be bed confined." This cannot be an order or on letterhead, needs to be in the office visit note or H/P per CCM.    Sanjuana Letters, MD 04/01/2020

## 2020-04-01 NOTE — Chronic Care Management (AMB) (Signed)
Care Management   Follow Up Note   04/01/2020 Name: Shannon [name] MRN: [id number] DOB: 28-May-1956  Referred by: [name], MD Reason for referral : Care Coordination (PCS, DME)   Shannon [name] is a 64 y.o. year old female who is a primary care patient of [name], MD. The care management team was consulted for assistance with care management and care coordination needs.    Review of patient status, including review of consultants reports, relevant laboratory and other test results, and collaboration with appropriate care team members and the patient's provider was performed as part of comprehensive patient evaluation and provision of chronic care management services.    SDOH (Social Determinants of Health) assessments performed: No See Care Plan activities for detailed interventions related to [facility])     Advanced Directives: See Care Plan and Vynca application for related entries.   Goals Addressed              This Visit's Progress     COMPLETED: " I need to have another drive through Covid test so I can have another set of lung tests and I can't use [facility] or Medicaid transportation. " (pt-stated)        [place] (see longitudinal plan of care for additional care plan information)  Current Barriers:   Transportation barriers- call transferred from clinic to this CCM RN- patient states she needs to have another drive through Covid test so she can have additional lung tests and she was told by both [facility] [place] and Medicaid transportation that they will not transport her  Nurse Case Manager Clinical Goal(s):   Over the next 4 days, patient will work with CCM  social worker to resolve transportation barrier to [employer] drive through test site.   Interventions:   Goal closed as pt completed test      Patient Self Care Activities:   Patient verbalizes understanding of plan to wok with CCM BSW to resolve transportation barrier  Unable  to independently drive to Covid drive through testing site  Please see past updates related to this goal by clicking on the "Past Updates" button in the selected goal        "I have just been diagnosed with lung cancer, I live alone in a 2 story townhouse, I have no local family and before I start treatment I am going to need help in my home and a hospital bed." (pt-stated)        CARE PLAN ENTRY (see longitudinal plan of care for additional care plan information)  Current Barriers:   Chronic Disease Management support, education, and care coordination needs related to CAD, HTN, HLD, COPD, CKD Stage 3, Anxiety, and Asthma- patient states the hospital bed was delivered but it is too uncomfortable to sleep in, she is requesting assist with getting a lift chair, she says she will have a simulation appointment at the cancer center on 04/01/20 in preparation to start radiation therapy, she also says she has been advised to start drinking Ensure supplements and to stay well hydrated while receiving radiation therapy. She says she is receiving [employer]'s Meals. She also says she has not chosen at least 2 agencies to accommodate [employer] staffing at [facility] request but says she will decide and make the call to [place] today. Patient voiced much appreciation for the assistance of the CCM team.   Clinical Goal(s) related to CAD, HTN, HLD, COPD, CKD Stage 3, Anxiety, and Asthma:  Over the next 30 days, patient will:   Work with the care management team to address educational, disease management, and care coordination needs   Begin or continue self health monitoring activities as directed today  work with care management team to secure DME and home care assistance  Call provider office for new or worsened signs and symptoms CAD, HTN, HLD, COPD, CKD Stage 3, Anxiety, and Asthma  Call care management team with questions or concerns  Verbalize basic understanding of patient centered plan of care  established today  Interventions related to CAD, HTN, HLD, COPD, CKD Stage 3, Anxiety, and Asthma:    Reinforced the importance of notifying [facility] of her PCS staffing agency choices other than [facility]  Informed patient that [place] will allow her to select as many agencies as she wishes to  Encouraged patient to select at least four agencies due to so many being short staffed.   Agreed to contact [place] on 04/05/20 for update on agencies selected    Please see past updates related to this goal by clicking on the "Past Updates" button in the selected goal        "I need a lift chair" (pt-stated)        [place] (see longitudinal plan of care for additional care plan information)  Current Barriers:   Patient seeking lift chair.  States that she has contacted [employer] and was told that chair is covered.    Clinical Social Work Clinical Goal(s):   Over the next 90 days, patient will work with SW to address concerns related to need for DME/lift chair  Interventions:  Inter-disciplinary care team collaboration (see longitudinal plan of care)  Inquired if [place] representative stated that chair would be covered at 100%  Informed patient that most Medicare plans do not cover this type of DME at 100%  Encouraged patient to contact [facility] again to confirm coverage.  Received call back from patient stating that, per [place] Representative, 80% of chair will be covered by [facility] and Medicaid will pay remaining %20  Informed patient that face to face visit is likely required for order to be placed.    Community message sent to Skeet Latch to inquire about need for face to face visit and documentation   Collaborated with provider regarding need for documentation of face to face visit at upcoming appointment on 04/12/20.  Documentation must include:  The use of a lift is needed to transfer between bed and chair, wheelchair or commode and without  it the patient would be bed confined.THIS CANNOT BE DOCUMENTED ON THE ORDER/RX OR OFFICE LETTERHEAD MUST BE IN OFFICE VISIT NOTES OR H&P ,CAN BE ADDED HAS ADDENDUM TO CLINICAL NOTES  Patient Self Care Activities:   Patient verbalizes understanding of plan to contact [place] to confirm coverage of DME, have face to face visit on 04/12/20  Self administers medications as prescribed  Calls provider office for new concerns or questions  Unable to perform IADLs independently  Initial goal documentation         Telephone follow up appointment with care management team member scheduled for:04/05/20     Ronn Melena, [place] Coordination Social Worker [place] [phone]

## 2020-04-01 NOTE — Patient Instructions (Signed)
Visit Information  Goals Addressed              This Visit's Progress   .  COMPLETED: " I need to have another drive through Covid test so I can have another set of lung tests and I can't use [facility] [place] or Medicaid transportation. " (pt-stated)        [place] (see longitudinal plan of care for additional care plan information)  Current Barriers:  . Transportation barriers- call transferred from clinic to this CCM RN- patient states she needs to have another drive through Covid test so she can have additional lung tests and she was told by both [facility] [place] and Medicaid transportation that they will not transport her  Nurse Case Manager Clinical Goal(s):  [name] Over the next 4 days, patient will work with CCM  social worker to resolve transportation barrier to [employer] drive through test site.   Interventions:  [name] Goal closed as pt completed test .  .  . Patient Self Care Activities:  . Patient verbalizes understanding of plan to wok with CCM BSW to resolve transportation barrier . Unable to independently drive to Covid drive through testing site  Please see past updates related to this goal by clicking on the "Past Updates" button in the selected goal      .  "I have just been diagnosed with lung cancer, I live alone in a 2 story townhouse, I have no local family and before I start treatment I am going to need help in my home and a hospital bed." (pt-stated)        [place] (see longitudinal plan of care for additional care plan information)  Current Barriers:  . Chronic Disease Management support, education, and care coordination needs related to CAD, HTN, HLD, COPD, CKD Stage 3, Anxiety, and Asthma- patient states the hospital bed was delivered but it is too uncomfortable to sleep in, she is requesting assist with getting a lift chair, she says she will have a simulation appointment at the cancer center on 04/01/20 in preparation to start radiation therapy, she  also says she has been advised to start drinking Ensure supplements and to stay well hydrated while receiving radiation therapy. She says she is receiving Mom's Meals. She also says she has not chosen at least 2 agencies to accommodate [employer] staffing at [facility] request but says she will decide and make the call to [place] today. Patient voiced much appreciation for the assistance of the CCM team.   Clinical Goal(s) related to CAD, HTN, HLD, COPD, CKD Stage 3, Anxiety, and Asthma:  Over the next 30 days, patient will:  . Work with the care management team to address educational, disease management, and care coordination needs  . Begin or continue self health monitoring activities as directed today  work with care management team to secure DME and home care assistance . Call provider office for new or worsened signs and symptoms CAD, HTN, HLD, COPD, CKD Stage 3, Anxiety, and Asthma . Call care management team with questions or concerns . Verbalize basic understanding of patient centered plan of care established today  Interventions related to CAD, HTN, HLD, COPD, CKD Stage 3, Anxiety, and Asthma:   . Reinforced the importance of notifying [facility] of her [facility] staffing agency choices other than [place] . Informed patient that [place] will allow her to select as many agencies as she wishes to . Encouraged patient to select at least four  agencies due to so many being short staffed.  . Agreed to contact [place] on 04/05/20 for update on agencies selected    Please see past updates related to this goal by clicking on the "Past Updates" button in the selected goal      .  "I need a lift chair" (pt-stated)        [place] (see longitudinal plan of care for additional care plan information)  Current Barriers:  . Patient seeking lift chair.  States that she has contacted [employer] and was told that chair is covered.    Clinical Social Work Clinical Goal(s):  [name] Over the  next 90 days, patient will work with SW to address concerns related to need for DME/lift chair  Interventions: . Inter-disciplinary care team collaboration (see longitudinal plan of care) . Inquired if [place] representative stated that chair would be covered at 100% . Informed patient that most Medicare plans do not cover this type of DME at 100% . Encouraged patient to contact [facility] again to confirm coverage. . Received call back from patient stating that, per [place] Representative, 80% of chair will be covered by [facility] and Medicaid will pay remaining %20 . Informed patient that face to face visit is likely required for order to be placed.   [name] Community message sent to Skeet Latch to inquire about need for face to face visit and documentation  . Collaborated with provider regarding need for documentation of face to face visit at upcoming appointment on 04/12/20.  Documentation must include:  The use of a lift is needed to transfer between bed and chair, wheelchair or commode and without it the patient would be bed confined.THIS CANNOT BE DOCUMENTED ON THE ORDER/RX OR OFFICE LETTERHEAD MUST BE IN OFFICE VISIT NOTES OR H&P ,CAN BE ADDED HAS ADDENDUM TO CLINICAL NOTES  Patient Self Care Activities:  . Patient verbalizes understanding of plan to contact [employer] to confirm coverage of DME, have face to face visit on 04/12/20 . Self administers medications as prescribed . Calls provider office for new concerns or questions . Unable to perform IADLs independently  Initial goal documentation        Patient verbalizes understanding of instructions provided today.   Telephone follow up appointment with care management team member scheduled for:04/05/20     Ronn Melena, [place] Coordination Social Worker [place] [phone]

## 2020-04-01 NOTE — Progress Notes (Signed)
Internal Medicine Clinic Attending  CCM services provided by the care management provider and their documentation were discussed with Dr. Allyson Sabal. We reviewed the pertinent findings, urgent action items addressed by the resident and non-urgent items to be addressed by the PCP.  I agree with the assessment, diagnosis, and plan of care documented in the CCM and resident's note.  Gilles Chiquito, MD 04/01/2020

## 2020-04-01 NOTE — Telephone Encounter (Signed)
Patient called to reports she is very nauseated and was up all night. She called to cancel her radiation tx today. She also reports sx of urinary frequency and burning. Patient advised per Dr. Sondra Come to call her PCP to get a UA and be treated. Patient advised she will be called back to reschedule her radiation appointment.

## 2020-04-02 ENCOUNTER — Encounter: Payer: Self-pay | Admitting: Cardiovascular Disease

## 2020-04-02 ENCOUNTER — Ambulatory Visit (INDEPENDENT_AMBULATORY_CARE_PROVIDER_SITE_OTHER): Payer: Medicare Other | Admitting: Cardiovascular Disease

## 2020-04-02 ENCOUNTER — Other Ambulatory Visit: Payer: Self-pay

## 2020-04-02 VITALS — BP 158/74 | HR 94 | Ht 63.5 in | Wt 232.4 lb

## 2020-04-02 DIAGNOSIS — J4541 Moderate persistent asthma with (acute) exacerbation: Secondary | ICD-10-CM

## 2020-04-02 DIAGNOSIS — I2510 Atherosclerotic heart disease of native coronary artery without angina pectoris: Secondary | ICD-10-CM

## 2020-04-02 DIAGNOSIS — I77810 Thoracic aortic ectasia: Secondary | ICD-10-CM | POA: Diagnosis not present

## 2020-04-02 DIAGNOSIS — I5032 Chronic diastolic (congestive) heart failure: Secondary | ICD-10-CM

## 2020-04-02 DIAGNOSIS — I10 Essential (primary) hypertension: Secondary | ICD-10-CM | POA: Diagnosis not present

## 2020-04-02 DIAGNOSIS — R0602 Shortness of breath: Secondary | ICD-10-CM

## 2020-04-02 DIAGNOSIS — R300 Dysuria: Secondary | ICD-10-CM

## 2020-04-02 DIAGNOSIS — C3492 Malignant neoplasm of unspecified part of left bronchus or lung: Secondary | ICD-10-CM

## 2020-04-02 MED ORDER — CLONIDINE HCL 0.1 MG PO TABS
0.1000 mg | ORAL_TABLET | Freq: Two times a day (BID) | ORAL | 1 refills | Status: DC
Start: 2020-04-02 — End: 2021-01-16

## 2020-04-02 NOTE — Progress Notes (Signed)
Cardiology Office Note    Evaluation Performed:  Follow-up visit  Date:  04/02/2020   ID:  Shannon [name], DOB 09/17/1955, MRN [id number]  Patient Location:  Home  Provider location:   Office  PCP:  [name], MD  Cardiologist:  Skeet Latch, MD  Electrophysiologist:  None   Chief Complaint:  hypertension  History of Present Illness:    Shannon [name] is a 64 y.o. female with stage IA (T1c, N0, M0) non-small cell lung cancer starting XRT, hypertension, paroxysmal atrial flutter, asthma, medication non-compliance, non-obstructive CAD, OSA, prior PE, moderate pulmonary hypertension, mild ascending aorta aneurysm, PAD, and HIV (well-controlled) who presents via video conferencing for a telehealth visit today. Shannon [name] was initially referred by [name], MD, on 12/26/15. [name] was concerned about her lower extremity edema and shortness of breath. She was started on lasix 20 mg every other day. Shannon [name] noted shortness of breath after a hospitalization for pneumonia 06/2015. She denied lower extremity edema or orthopnea but did report chest pain when laying down at night. She was referred for an echo 01/2016 that revealed LVEF 60-65% awith grade 1 diastolic dysfunction and hypokinesis of the basal inferior wall. She also had very mild aortic stenosis with a mean gradient of 9 mmHg and trivial AR. PASP was 52 mmHg. She had a Lexiscan Myoviewat that time that was negative for ischemia. Shannon [name] previously had a Lexiscan Cardiolite in September 2013 that revealed a medium, mild reversible defect in the anterior wall and normal systolic function. She subsequently underwent cardiac catheterization that revealed nonobstructive coronary disease. She had mild pulmonary hypertension and a normal cardiac output. Left ventriculography revealed mild to moderate aortic regurgitation. She had a 24-hour Holter in February 2015 that revealed rare PACs and PVCs. Echo 01/06/17  revealed LVEF 60-65% with mild LVH and a mean aortic valve gradient of 8 mmHg. Tricuspid regurgitation was not significant enough to assess pulmonary pressures.  Repeat echo 01/2019 revealed LVEF >65%, severe LVH, intracavitary gradient 61 mmHg, mild AS (mean gradient 13 mmHg), mild-moderate AR, and mild ascending aorta aneurysm (4.1 cm).  Shannon [name] was admitted to the hospital 01/25/17 with hypertensive emergency and chest pain. Her blood pressure was 215/100.  She consistently struggles with mediation adherence and has strong feelings about what medicines she should take and when she should take them.  [name] lot of [facility] there but hasn't been going lately 2/2 COVID-19.  Lisinopril was stopped due to cough and throat swelling.  Shannon [name] reported claudication and had ABIs performed by her PCP, Dr. Daryll Drown. ABI/Dopplers 02/2019 revealed moderate R LE arterial disease and mild L LE disease.  She was referred to our vascular specialists but prefers to keep seeing me.    Shannon [name]'s clonidine was increased to 0.3 mg weekly.  Prior to the vaccine her BP was running in the 130s-140s.  She decided to stop taking her clonidine for a few days before she got her vaccine because she did not know how it would affect her.  She had a repeat echocardiogram 10/2019 that revealed LVEF 60 to 65% with mild LVH and grade 1 diastolic dysfunction.  The ascending aorta was normal in size.  Since her last appointment Shannon [name] was diagnosed with Stage 1 non-small cell lung cancer.  She will begin XRT soon.  She has been feeling poorly.  She was unable to have her XRT appointment  yesterday because of severe nausea.  She has also been very fatigued which she attributes to her lung cancer diagnosis.  She has a metallic taste in her mouth and is concerned she has a UTI.  She has discomfort in her back and reports dysuria.  She has not been taking her clonidine patch  for several weeks.  She states that she was told she couldn't use her clonidine patch while she was having imaging of her chest.  She is unsure who told her this but thinks it was a radiologist.  She also notes that she was planning to reduce the dose because her blood pressure was getting too low on 0.3 mg.  She notes that she only takes 20 mg of Lasix because if she takes more her legs start to cramp.  She does still have a lot of shortness of breath.  She denies orthopnea or PND.  She was evaluated by Dr. Kipp Brood but needs more for pulmonary function testing before decision can be made whether she can have surgery.  Chemotherapy is not needed for her lung cancer given that it was a stage I.  Prior CV studies:   The following studies were reviewed today:  Echo 01/06/17: Study Conclusions  - Left ventricle: The cavity size was normal. Wall thickness was   increased in a pattern of mild LVH. Systolic function was normal.   The estimated ejection fraction was in the range of 60% to 65%.   Wall motion was normal; there were no regional wall motion   abnormalities. Doppler parameters are consistent with abnormal   left ventricular relaxation (grade 1 diastolic dysfunction). - Aortic valve: Trileaflet; moderately calcified leaflets.   Sclerosis without stenosis. There was mild to moderate   regurgitation. Mean gradient (S): 8 mm Hg. - Mitral valve: There was no significant regurgitation. - Left atrium: The atrium was moderately dilated. - Right ventricle: The cavity size was normal. Systolic function   was normal. - Right atrium: The atrium was mildly dilated. - Pulmonary arteries: No complete TR doppler jet so unable to   estimate PA systolic pressure. - Systemic veins: IVC measured 2.2 cm with > 50% respirophasic   variation, suggesting RA pressure 8 mmHg.  Impressions:  - Normal LV size with mild LV hypertrophy. EF 60-65%. Calcified,   trileaflet aortic valve. Sclerosis without  significant stenosis,   mild to moderate aortic insufficiency. Normal RV size and   systolic function. Biatrial enlargement.  Echo 11/15/19: IMPRESSIONS    1. Left ventricular ejection fraction, by estimation, is 60 to 65%. The  left ventricle has normal function. The left ventricle has no regional  wall motion abnormalities. There is mild left ventricular hypertrophy.  Left ventricular diastolic parameters  are consistent with Grade I diastolic dysfunction (impaired relaxation).  2. Right ventricular systolic function is normal. The right ventricular  size is normal. There is normal pulmonary artery systolic pressure. The  estimated right ventricular systolic pressure is 75.1 mmHg.  3. The mitral valve is degenerative. No evidence of mitral valve  regurgitation. No evidence of mitral stenosis. There is turbulence across  the mitral valve but it does not appear restricted and there is no  evidence for a significant pressure gradient  across it.  4. The aortic valve is tricuspid. Aortic valve regurgitation is trivial.  Mild aortic valve sclerosis is present, with no evidence of aortic valve  stenosis.  5. The inferior vena cava is normal in size with greater than 50%  respiratory  variability, suggesting right atrial pressure of 3 mmHg.   Lower extremity Doppler 03/09/19: Tibial vessel disease; one vessel run-off via peroneal artery.    Summary:  Right: Near normal examination. No hemodynamically significant arterial  stenosis/occlusion noted in the CFA, DFA, SFA, popliteal artery and TPT.   Tibial vessel disease; zero vessel run-off.   Left: Near normal examination. No hemodynamically significant arterial  stenosis/occlusion noted in the CFA, DFA, SFA, popliteal artery and TPT.   Tibial vessel disease; one vessel run-off via peroneal artery.   Past Medical History:  Diagnosis Date  . Anemia   . Anxiety    HX PANIC ATTACKS  . Arthritis    "starting to; in my hands"  (07/09/2015)  . Asthma   . Atrial fibrillation ([place])   . Atrial flutter, paroxysmal ([place])   . Bloated abdomen   . CFS (chronic fatigue syndrome)   . Chewing difficulty   . Chronic asthma with acute exacerbation    "I have chronic asthma all the time; sometimes exacerbations" (07/09/2015)  . Chronic lower back pain   . COPD (chronic obstructive pulmonary disease) ([place])   . Cyst of right kidney    "3 of them; dx'd in ~ 01/2015"  . Dyspnea   . GERD (gastroesophageal reflux disease)   . Heart murmur   . History of blood transfusion    "related to my brain surgery I think"  . History of pulmonary embolism 07/09/2015  . HIV antibody positive ([place])   . HIV disease ([place])   . Hyperlipidemia   . Hypertension   . Leg edema   . Lipodystrophy   . Mild CAD 2013  . Multiple thyroid nodules   . Osteopenia   . Palpitations   . Pneumonia 07/09/2015  . Shingles   . Sleep apnea    "never completed part 2 of study; never wore mask" (07/09/2015)  . Vitamin B 12 deficiency   . Vitamin D deficiency    Past Surgical History:  Procedure Laterality Date  . ABDOMINAL HYSTERECTOMY     "robotic laparosopic"  . BRAIN SURGERY  1974   "brain tumor; benign; on top of my brain; got a plate in there"  . BRAIN SURGERY     age 51- -"Tumor pushing my skullout"  . BRONCHIAL BIOPSY  02/13/2020   Procedure: BRONCHIAL BIOPSIES;  Surgeon: [name], MD;  Location: [facility] ENDOSCOPY;  Service: Pulmonary;;  . BRONCHIAL BRUSHINGS  02/13/2020   Procedure: BRONCHIAL BRUSHINGS;  Surgeon: [name], MD;  Location: [facility] ENDOSCOPY;  Service: Pulmonary;;  . BRONCHIAL NEEDLE ASPIRATION BIOPSY  02/13/2020   Procedure: BRONCHIAL NEEDLE ASPIRATION BIOPSIES;  Surgeon: [name], MD;  Location: [facility] ENDOSCOPY;  Service: Pulmonary;;  . CARDIAC CATHETERIZATION    . FIDUCIAL MARKER PLACEMENT  02/13/2020   Procedure: FIDUCIAL MARKER PLACEMENT;  Surgeon: [name], MD;  Location: [facility] ENDOSCOPY;  Service: Pulmonary;;  .  TONSILLECTOMY AND ADENOIDECTOMY    . VIDEO BRONCHOSCOPY WITH ENDOBRONCHIAL NAVIGATION N/A 02/13/2020   Procedure: VIDEO BRONCHOSCOPY WITH ENDOBRONCHIAL NAVIGATION;  Surgeon: [name], MD;  Location: [facility] ENDOSCOPY;  Service: Pulmonary;  Laterality: N/A;     Current Meds  Medication Sig  . acetaminophen (TYLENOL) 325 MG tablet Take 325 mg by mouth every 6 (six) hours as needed (FOR PAIN).  [name] albuterol (PROVENTIL) (2.5 MG/3ML) 0.083% nebulizer solution Take 3 mLs (2.5 mg total) by nebulization every 6 (six) hours as needed for wheezing or shortness of breath. USE 1 VIAL VIA  NEBULIZER EVERY 6 HOURS AS NEEDED FOR WHEEZING OR SHORTNESS OF BREATH  . Ascorbic Acid (VITAMIN C) 1000 MG tablet Take 1,000 mg by mouth every other day.   . b complex vitamins tablet Take 1 tablet by mouth daily.  . bictegravir-emtricitabine-tenofovir AF (BIKTARVY) 50-200-25 MG TABS tablet Take 1 tablet by mouth daily.  [name] BIOTIN PO Take 1 tablet by mouth daily as needed (for supplementation).   . calcium carbonate (OSCAL) 1500 (600 Ca) MG TABS tablet Take 600 mg of elemental calcium by mouth daily with breakfast.  . Cholecalciferol (SM VITAMIN D3) 100 MCG (4000 UT) CAPS Take 1 capsule (4,000 Units total) by mouth daily.  . cyanocobalamin (,VITAMIN B-12,) 1000 MCG/ML injection ADMINISTER 1 ML(1000 MCG) IN THE MUSCLE EVERY 30 DAYS  . cyanocobalamin (,VITAMIN B-12,) 1000 MCG/ML injection Inject 1 mL (1,000 mcg total) into the muscle every 30 (thirty) days. ADMINISTER 1 ML(1000 MCG) IN THE MUSCLE EVERY 30 DAYS  . diclofenac Sodium (VOLTAREN) 1 % GEL Apply 2 g topically 4 (four) times daily.  [name] diltiazem (CARDIZEM CD) 240 MG 24 hr capsule TAKE 1 CAPSULE(240 MG) BY MOUTH DAILY (Patient taking differently: Take 240 mg by mouth daily. )  . fluticasone furoate-vilanterol (BREO ELLIPTA) 200-25 MCG/INH AEPB Inhale 1 puff into the lungs daily.  . furosemide (LASIX) 20 MG tablet Take 1 tablet (20 mg total) by mouth every other day.  [name]  MAGNESIUM PO Take 1 tablet by mouth every other day.   . Melatonin 5 MG TABS Take 5 mg by mouth at bedtime as needed (for sleep).  . Misc. Devices (PULSE OXIMETER FOR FINGER) MISC 1 Units by Does not apply route as needed.  . Mouthwashes (BIOTENE DRY MOUTH GENTLE) LIQD Use as directed 1 Dose in the mouth or throat 2 (two) times daily as needed (dry mouth).  . Omega-3 Fatty Acids (FISH OIL) 1000 MG CAPS Take 1,000 mg by mouth daily as needed (pt prefrence).  [name] omeprazole (PRILOSEC) 40 MG capsule Take 1 capsule (40 mg total) by mouth daily as needed (heart burn).  . OXYGEN Inhale 1.5-2 L into the lungs See admin instructions. Uses at bedtime and through the day as needed  . polyethylene glycol (MIRALAX / GLYCOLAX) 17 g packet Take 17 g by mouth daily as needed for mild constipation.  [name] PREVIDENT 5000 SENSITIVE 1.1-5 % PSTE Use daily as directed  . PROAIR HFA 108 (90 Base) MCG/ACT inhaler Inhale 2 puffs into the lungs every 4 (four) hours as needed for wheezing or shortness of breath. INHALE 2 PUFFS INTO THE LUNGS EVERY 4 HOURS AS NEEDED FOR WHEEZING OR SHORTNESS OF BREATH  . rivaroxaban (XARELTO) 20 MG TABS tablet Take 1 tablet (20 mg total) by mouth daily with supper. Okay to restart this medication on Thursday, 02/15/2020.  . senna (SENOKOT) 8.6 MG tablet Take 1 tablet by mouth daily as needed for constipation.  [name] spironolactone (ALDACTONE) 25 MG tablet Take 0.5 tablets (12.5 mg total) by mouth daily.  . SYRINGE/NEEDLE, DISP, 1 ML (B-D SYRINGE/NEEDLE 1CC/25GX5/8) 25G X 5/8" 1 ML MISC 1 Units by Does not apply route every 30 (thirty) days.  . vitamin A 3 MG (10000 UNITS) capsule Take 10,000 Units by mouth daily.  [name] zinc gluconate 50 MG tablet Take 50 mg by mouth daily.  . [DISCONTINUED] cloNIDine (CATAPRES - DOSED IN MG/24 HR) 0.3 mg/24hr patch Place 0.3 mg onto the skin once a week.      Allergies:   Tree extract, Augmentin [  amoxicillin-pot clavulanate], Lisinopril, and Ciprofloxacin   Social  History   Tobacco Use  . Smoking status: Never Smoker  . Smokeless tobacco: Never Used  Vaping Use  . Vaping Use: Never used  Substance Use Topics  . Alcohol use: Not Currently    Alcohol/week: 0.0 standard drinks    Comment: Rarely.  . Drug use: No     Family Hx: The patient's family history includes Asthma in her mother; Diabetes in her sister; Heart disease in her mother; Heart failure in her mother; Heart murmur in her brother, sister, and sister; Hypertension in her mother; Obesity in her mother; Thyroid disease in her mother and sister.  ROS:   Please see the history of present illness.     All other systems reviewed and are negative.   Labs/Other Tests and Data Reviewed:    Recent Labs: 05/26/2019: TSH 0.673 02/15/2020: B Natriuretic Peptide 73.6 02/17/2020: Magnesium 2.0 02/20/2020: ALT 20; Hemoglobin 12.8; Platelet Count 203 02/22/2020: BUN 18; Creatinine, Ser 1.17; Potassium 4.6; Sodium 142   Recent Lipid Panel Lab Results  Component Value Date/Time   CHOL 249 (H) 11/10/2019 10:55 AM   CHOL 253 (H) 05/03/2018 10:43 AM   TRIG 53 11/10/2019 10:55 AM   HDL 70 11/10/2019 10:55 AM   HDL 86 05/03/2018 10:43 AM   CHOLHDL 3.6 11/10/2019 10:55 AM   LDLCALC 164 (H) 11/10/2019 10:55 AM    Wt Readings from Last 3 Encounters:  04/02/20 232 lb 6.4 oz (105.4 kg)  03/26/20 231 lb 12.8 oz (105.1 kg)  03/06/20 232 lb 3.2 oz (105.3 kg)     Objective:    VS:  BP (!) 158/74   Pulse 94   Ht 5' 3.5" (1.613 m)   Wt 232 lb 6.4 oz (105.4 kg)   SpO2 92%   BMI 40.52 kg/m  , BMI Body mass index is 40.52 kg/m. GENERAL:  Chronically ill-appearing.  No acute distress HEENT: Pupils equal round and reactive, fundi not visualized, oral mucosa unremarkable NECK:  No jugular venous distention, waveform within normal limits, carotid upstroke brisk and symmetric, no bruits, no thyromegaly LYMPHATICS:  No cervical adenopathy LUNGS:  Clear to auscultation bilaterally HEART:  RRR.  PMI not  displaced or sustained,S1 and S2 within normal limits, no S3, no S4, no clicks, no rubs, no murmurs ABD:  Central adiposity, positive bowel sounds normal in frequency in pitch, no bruits, no rebound, no guarding, no midline pulsatile mass, no hepatomegaly, no splenomegaly EXT: 1+ DP/PT bilaterally, no edema, no cyanosis no clubbing SKIN:  No rashes no nodules.   NEURO:  Cranial nerves II through XII grossly intact, motor grossly intact throughout PSYCH:  Cognitively intact, oriented to person place and time   ASSESSMENT & PLAN:    # Hypertension:Blood pressure is high today.  She has not taken her clonidine patch in weeks.  She says she has been told she cannot have this due to her treatment.  I am not sure she is referencing her radiation treatments.  I can understand why she can have a patch in the visual field when she is getting radiation or imaging.  Is unclear to me why she would not be able to take it on her thigh or another area.  Nevertheless, we will switch her to clonidine pills 0.1 mg twice daily while she is undergoing therapy.  Given her issues with compliance long-term I think she would be better to switch back to the patch treatments are completed.  Continue furosemide,  diltiazem, and spironolactone.  # Dysuria;  Check a U/A.  Chronic diastolic heart failure:  She appears to be euvolemic on exam.  However she does have a shortness of breath.  She notes that in the past when she felt like that she had extra fluid on board.  We will check a BNP today.  Continue Lasix 20 mg daily for now.  # Pre-surgical risk: Shannon [name] could potentially need a lobectomy in the future.  She has a lot of underlying medical conditions that make her higher risk for surgery.  After an outpatient bronchoscopy she had hypoxia and difficulty weaning from the ventilator.  Pulmonary work-up is pending.  We already ordered a Lexiscan Myoview in the past.  She keeps canceling it.  We explained that she would  need this for surgical clearance prior to a major intrathoracic surgery, especially given that she is unable to achieve 4 METS without dyspnea.  She expressed understanding.  # Aortic regurgitation # Aortic stenosis: Repeat echo 10/2019 was negative for aortic stenosis and ascending aortic aneurysm. Both echos were reviewed.  It appears to be very mild, on the border between aortic stenosis and sclerosis.  # Hyperlipidemia:Lipids are elevated.  Check fasting lipids/CMP when she is able to come to the office.   # Atrial flutter: Converted with flecainide. Continue diltiazem and Xarelto.   # Ascending aorta aneurysm: 4.0 on CT 12/2016.It was unchanged on MRA 10/2018.  4.1 cm on echo 01/2019.  Will repeat 01/2020.  BP control as above.   # PAD: Lipid control as above.  She prefers to manage her other issues prior to seeing her vascular team.   Time:   Today, I have spent 45 minutes with the patient with telehealth technology discussing hypertension.   Medication Adjustments/Labs and Tests Ordered: Current medicines are reviewed at length with the patient today.  Concerns regarding medicines are outlined above.   Tests Ordered: Orders Placed This Encounter  Procedures  . B Nat Peptide  . Urinalysis  . MYOCARDIAL PERFUSION IMAGING   Medication Changes: Meds ordered this encounter  Medications  . cloNIDine (CATAPRES) 0.1 MG tablet    Sig: Take 1 tablet (0.1 mg total) by mouth every 12 (twelve) hours.    Dispense:  180 tablet    Refill:  1    D/C PATCH    Disposition:  Follow up in 2 month(s)  Signed, Skeet Latch, MD  04/02/2020 1:22 PM    [facility]

## 2020-04-02 NOTE — Patient Instructions (Addendum)
Medication Instructions:  STOP CLONIDINE PATCH  START CLONIDINE 0.1 MG 1 TABLET EVERY 12 HOURS  *If you need a refill on your cardiac medications before your next appointment, please call your pharmacy*  Lab Work: URINALYSIS/BNP TODAY   Testing/Procedures: Your physician has requested that you have a lexiscan myoview. For further information please visit [email or web address]. Please follow instruction sheet, as given. TO BE DONE AT [place] [address]  Follow-Up: At [facility], you and your health needs are our priority.  As part of our continuing mission to provide you with exceptional heart care, we have created designated Provider Care Teams.  These Care Teams include your primary Cardiologist (physician) and Advanced Practice Providers (APPs -  Physician Assistants and Nurse Practitioners) who all work together to provide you with the care you need, when you need it.  We recommend signing up for the patient portal called "MyChart".  Sign up information is provided on this After Visit Summary.  MyChart is used to connect with patients for Virtual Visits (Telemedicine).  Patients are able to view lab/test results, encounter notes, upcoming appointments, etc.  Non-urgent messages can be sent to your provider as well.   To learn more about what you can do with MyChart, go to [email or web address].    Your next appointment:   2 month(s)  The format for your next appointment:   In Person  Provider:   You may see Skeet Latch, MD or one of the following Advanced Practice Providers on your designated Care Team:    [name], PA-C  [place], [place]  Coletta Memos, 

## 2020-04-03 ENCOUNTER — Ambulatory Visit
Admission: RE | Admit: 2020-04-03 | Discharge: 2020-04-03 | Disposition: A | Discharge status: Home / Self Care | Payer: Medicare Other | Source: Ambulatory Visit | Attending: Radiation Oncology | Admitting: Radiation Oncology

## 2020-04-03 ENCOUNTER — Other Ambulatory Visit: Payer: Self-pay

## 2020-04-03 DIAGNOSIS — C3412 Malignant neoplasm of upper lobe, left bronchus or lung: Secondary | ICD-10-CM | POA: Insufficient documentation

## 2020-04-03 DIAGNOSIS — Z510 Encounter for antineoplastic radiation therapy: Secondary | ICD-10-CM | POA: Diagnosis not present

## 2020-04-05 ENCOUNTER — Ambulatory Visit: Payer: Medicare Other

## 2020-04-05 ENCOUNTER — Encounter: Payer: Medicare Other | Admitting: Internal Medicine

## 2020-04-05 DIAGNOSIS — C3492 Malignant neoplasm of unspecified part of left bronchus or lung: Secondary | ICD-10-CM

## 2020-04-05 DIAGNOSIS — I5032 Chronic diastolic (congestive) heart failure: Secondary | ICD-10-CM

## 2020-04-05 DIAGNOSIS — I10 Essential (primary) hypertension: Secondary | ICD-10-CM

## 2020-04-05 NOTE — Patient Instructions (Signed)
Visit Information  Goals Addressed              This Visit's Progress   .  "I have just been diagnosed with lung cancer, I live alone in a 2 story townhouse, I have no local family and before I start treatment I am going to need help in my home and a hospital bed." (pt-stated)        [place] (see longitudinal plan of care for additional care plan information)  Current Barriers:  . Chronic Disease Management support, education, and care coordination needs related to CAD, HTN, HLD, COPD, CKD Stage 3, Anxiety, and Asthma- patient states the hospital bed was delivered but it is too uncomfortable to sleep in, she is requesting assist with getting a lift chair, she says she will have a simulation appointment at the cancer center on 04/01/20 in preparation to start radiation therapy, she also says she has been advised to start drinking Ensure supplements and to stay well hydrated while receiving radiation therapy. She says she is receiving Mom's Meals. She also says she has not chosen at least 2 agencies to accommodate [employer] staffing at [facility] request but says she will decide and make the call to [facility] today. Patient voiced much appreciation for the assistance of the CCM team.   Clinical Goal(s) related to CAD, HTN, HLD, COPD, CKD Stage 3, Anxiety, and Asthma:  Over the next 30 days, patient will:  . Work with the care management team to address educational, disease management, and care coordination needs  . Begin or continue self health monitoring activities as directed today  work with care management team to secure DME and home care assistance . Call provider office for new or worsened signs and symptoms CAD, HTN, HLD, COPD, CKD Stage 3, Anxiety, and Asthma . Call care management team with questions or concerns . Verbalize basic understanding of patient centered plan of care established today  Interventions related to CAD, HTN, HLD, COPD, CKD Stage 3, Anxiety, and Asthma:    . Called patient to ensure that she has contacted [employer] to provide choices for [employer] agency . [place] regarding status of services.  Per [facility] representative, patient just made new selection yesterday.  Patient requested that [facility] be contacted again to determine if they now have staffing available.  [facility]  is awaiting response from [facility].  If unable to accommodate, [facility] will send request to other two agencies that patient selected.    Please see past updates related to this goal by clicking on the "Past Updates" button in the selected goal         Patient verbalizes understanding of instructions provided today.   The care management team will reach out to the patient again over the next 7 days.       Ronn Melena, [place] Coordination Social Worker [place] [phone]

## 2020-04-05 NOTE — Progress Notes (Signed)
Internal Medicine Clinic Resident  I have personally reviewed this encounter including the documentation in this note and/or discussed this patient with the care management provider. I will address any urgent items identified by the care management provider and will communicate my actions to the patient's PCP. I have reviewed the patient's CCM visit with my supervising attending, Dr Heber Silvis.  Jose Persia, MD 04/05/2020

## 2020-04-05 NOTE — Chronic Care Management (AMB) (Signed)
Care Management   Follow Up Note   04/05/2020 Name: Shannon [name] MRN: [id number] DOB: 04-11-1956  Referred by: [name], MD Reason for referral : Care Coordination (PCS)   CLAIRESSA [name] is a 64 y.o. year old female who is a primary care patient of [name], MD. The care management team was consulted for assistance with care management and care coordination needs.    Review of patient status, including review of consultants reports, relevant laboratory and other test results, and collaboration with appropriate care team members and the patient's provider was performed as part of comprehensive patient evaluation and provision of chronic care management services.    SDOH (Social Determinants of Health) assessments performed: No See Care Plan activities for detailed interventions related to [facility])     Advanced Directives: See Care Plan and Vynca application for related entries.   Goals Addressed              This Visit's Progress     "I have just been diagnosed with lung cancer, I live alone in a 2 story townhouse, I have no local family and before I start treatment I am going to need help in my home and a hospital bed." (pt-stated)        [place] (see longitudinal plan of care for additional care plan information)  Current Barriers:   Chronic Disease Management support, education, and care coordination needs related to CAD, HTN, HLD, COPD, CKD Stage 3, Anxiety, and Asthma- patient states the hospital bed was delivered but it is too uncomfortable to sleep in, she is requesting assist with getting a lift chair, she says she will have a simulation appointment at the cancer center on 04/01/20 in preparation to start radiation therapy, she also says she has been advised to start drinking Ensure supplements and to stay well hydrated while receiving radiation therapy. She says she is receiving Mom's Meals. She also says she has not chosen at least 2 agencies to  accommodate [employer] staffing at [facility] request but says she will decide and make the call to [place] today. Patient voiced much appreciation for the assistance of the CCM team.   Clinical Goal(s) related to CAD, HTN, HLD, COPD, CKD Stage 3, Anxiety, and Asthma:  Over the next 30 days, patient will:   Work with the care management team to address educational, disease management, and care coordination needs   Begin or continue self health monitoring activities as directed today  work with care management team to secure DME and home care assistance  Call provider office for new or worsened signs and symptoms CAD, HTN, HLD, COPD, CKD Stage 3, Anxiety, and Asthma  Call care management team with questions or concerns  Verbalize basic understanding of patient centered plan of care established today  Interventions related to CAD, HTN, HLD, COPD, CKD Stage 3, Anxiety, and Asthma:    Called patient to ensure that she has contacted [employer] to provide choices for [employer] service agency  [employer] regarding status of services.  Per [facility] representative, patient just made new selection yesterday.  Patient requested that [facility] be contacted again to determine if they now have staffing available.  [facility]  is awaiting response from [facility].  If unable to accommodate, [facility] will send request to other two agencies that patient selected.    Please see past updates related to this goal by clicking on the "Past Updates" button in the selected goal  The care management team will reach out to the patient again over the next 7 days.      Ronn Melena, [place] Coordination Social Worker [place] [phone]

## 2020-04-07 ENCOUNTER — Other Ambulatory Visit: Payer: Self-pay | Admitting: Internal Medicine

## 2020-04-07 DIAGNOSIS — I5032 Chronic diastolic (congestive) heart failure: Secondary | ICD-10-CM

## 2020-04-08 ENCOUNTER — Ambulatory Visit: Payer: Medicare Other | Admitting: Radiation Oncology

## 2020-04-08 DIAGNOSIS — C3412 Malignant neoplasm of upper lobe, left bronchus or lung: Secondary | ICD-10-CM | POA: Diagnosis not present

## 2020-04-08 DIAGNOSIS — Z510 Encounter for antineoplastic radiation therapy: Secondary | ICD-10-CM | POA: Diagnosis not present

## 2020-04-08 NOTE — Progress Notes (Signed)
  Radiation Oncology         ([phone] ________________________________  Name: Shannon [name] MRN: [id number]  Date: 04/09/2020  DOB: [birth year]  Stereotactic Body Radiotherapy Treatment Procedure Note  NARRATIVE:  Shannon [name] was brought to the stereotactic radiation treatment machine and placed supine on the CT couch. The patient was set up for stereotactic body radiotherapy on the body fix pillow.  3D TREATMENT PLANNING AND DOSIMETRY:  The patient's radiation plan was reviewed and approved prior to starting treatment.  It showed 3-dimensional radiation distributions overlaid onto the planning CT.  The [facility] for the target structures as well as the organs at risk were reviewed. The documentation of this is filed in the radiation oncology EMR.  SIMULATION VERIFICATION:  The patient underwent CT imaging on the treatment unit.  These were carefully aligned to document that the ablative radiation dose would cover the target volume and maximally spare the nearby organs at risk according to the planned distribution.  SPECIAL TREATMENT PROCEDURE: Arena Lindahl Charo received high dose ablative stereotactic body radiotherapy to the planned target volume without unforeseen complications. Treatment was delivered uneventfully. The high doses associated with stereotactic body radiotherapy and the significant potential risks require careful treatment set up and patient monitoring constituting a special treatment procedure   STEREOTACTIC TREATMENT MANAGEMENT:  Following delivery, the patient was evaluated clinically. The patient tolerated treatment without significant acute effects, and was discharged to home in stable condition.    PLAN: Continue treatment as planned.  ________________________________  Blair Promise, PhD, MD  This document serves as a record of services personally performed by Gery Pray, MD. It was created on his behalf by Clerance Lav, a trained medical scribe. The creation of  this record is based on the scribe's personal observations and the provider's statements to them. This document has been checked and approved by the attending provider.

## 2020-04-08 NOTE — Telephone Encounter (Signed)
Received refill request for lasix 40 mg daily. Changed prescription to 20 mg daily per most recent cardiology note 04/02/20.

## 2020-04-09 ENCOUNTER — Ambulatory Visit: Payer: Medicare Other | Admitting: Radiation Oncology

## 2020-04-09 ENCOUNTER — Other Ambulatory Visit: Payer: Self-pay

## 2020-04-09 ENCOUNTER — Encounter (HOSPITAL_COMMUNITY): Payer: Self-pay

## 2020-04-09 ENCOUNTER — Telehealth: Payer: Self-pay | Admitting: *Deleted

## 2020-04-09 ENCOUNTER — Ambulatory Visit
Admission: RE | Admit: 2020-04-09 | Discharge: 2020-04-09 | Disposition: A | Discharge status: Home / Self Care | Payer: Medicare Other | Source: Ambulatory Visit | Attending: Radiation Oncology | Admitting: Radiation Oncology

## 2020-04-09 DIAGNOSIS — C3490 Malignant neoplasm of unspecified part of unspecified bronchus or lung: Secondary | ICD-10-CM

## 2020-04-09 DIAGNOSIS — Z510 Encounter for antineoplastic radiation therapy: Secondary | ICD-10-CM | POA: Diagnosis not present

## 2020-04-09 DIAGNOSIS — C3412 Malignant neoplasm of upper lobe, left bronchus or lung: Secondary | ICD-10-CM | POA: Diagnosis not present

## 2020-04-09 NOTE — Telephone Encounter (Signed)
Pt is completely confused about appts and needs transportation worked out, she states transportation services holds it against her when schedules change and they wont take her 2 places within an hour or 2. I suggested she speak to csw at cancer ctr and with dr Clabe Seal nurse. Could we help her with these appts upcoming. Also she states that she has to have a covid test every few days and shes tired, states she has had 4 in 1 week

## 2020-04-10 ENCOUNTER — Ambulatory Visit: Payer: Medicare Other | Admitting: Radiation Oncology

## 2020-04-10 NOTE — Progress Notes (Signed)
  Radiation Oncology         [phone] ________________________________  Name: Shannon [name] MRN: [id number]  Date: 04/11/2020  DOB: March 14, 1956  Stereotactic Body Radiotherapy Treatment Procedure Note  NARRATIVE:  Shannon [name] was brought to the stereotactic radiation treatment machine and placed supine on the CT couch. The patient was set up for stereotactic body radiotherapy on the body fix pillow.  3D TREATMENT PLANNING AND DOSIMETRY:  The patient's radiation plan was reviewed and approved prior to starting treatment.  It showed 3-dimensional radiation distributions overlaid onto the planning CT.  The [facility] for the target structures as well as the organs at risk were reviewed. The documentation of this is filed in the radiation oncology EMR.  SIMULATION VERIFICATION:  The patient underwent CT imaging on the treatment unit.  These were carefully aligned to document that the ablative radiation dose would cover the target volume and maximally spare the nearby organs at risk according to the planned distribution.  SPECIAL TREATMENT PROCEDURE: Shannon [name] received high dose ablative stereotactic body radiotherapy to the planned target volume without unforeseen complications. Treatment was delivered uneventfully. The high doses associated with stereotactic body radiotherapy and the significant potential risks require careful treatment set up and patient monitoring constituting a special treatment procedure   STEREOTACTIC TREATMENT MANAGEMENT:  Following delivery, the patient was evaluated clinically. The patient tolerated treatment without significant acute effects, and was discharged to home in stable condition.    PLAN: Continue treatment as planned.  ________________________________  Blair Promise, PhD, MD  This document serves as a record of services personally performed by Gery Pray, MD. It was created on his behalf by Clerance Lav, a trained medical scribe. The creation of  this record is based on the scribe's personal observations and the provider's statements to them. This document has been checked and approved by the attending provider.

## 2020-04-11 ENCOUNTER — Ambulatory Visit: Payer: Medicare Other

## 2020-04-11 ENCOUNTER — Other Ambulatory Visit: Payer: Self-pay

## 2020-04-11 ENCOUNTER — Telehealth: Payer: Self-pay

## 2020-04-11 ENCOUNTER — Ambulatory Visit
Admission: RE | Admit: 2020-04-11 | Discharge: 2020-04-11 | Disposition: A | Discharge status: Home / Self Care | Payer: Medicare Other | Source: Ambulatory Visit | Attending: Radiation Oncology | Admitting: Radiation Oncology

## 2020-04-11 ENCOUNTER — Ambulatory Visit: Payer: Medicare Other | Admitting: Radiation Oncology

## 2020-04-11 DIAGNOSIS — C3492 Malignant neoplasm of unspecified part of left bronchus or lung: Secondary | ICD-10-CM

## 2020-04-11 DIAGNOSIS — Z510 Encounter for antineoplastic radiation therapy: Secondary | ICD-10-CM | POA: Diagnosis not present

## 2020-04-11 DIAGNOSIS — I5032 Chronic diastolic (congestive) heart failure: Secondary | ICD-10-CM

## 2020-04-11 DIAGNOSIS — C3412 Malignant neoplasm of upper lobe, left bronchus or lung: Secondary | ICD-10-CM | POA: Diagnosis not present

## 2020-04-11 DIAGNOSIS — I10 Essential (primary) hypertension: Secondary | ICD-10-CM

## 2020-04-11 NOTE — Chronic Care Management (AMB) (Signed)
°  Care Management   Follow Up Note   04/11/2020 Name: Shannon [name] MRN: [id number] DOB: Jan 14, 1956  Referred by: [name], MD Reason for referral : Care Coordination (DME)   Shannon [name] is a 64 y.o. year old female who is a primary care patient of [name], MD. The care management team was consulted for assistance with care management and care coordination needs.    Review of patient status, including review of consultants reports, relevant laboratory and other test results, and collaboration with appropriate care team members and the patient's provider was performed as part of comprehensive patient evaluation and provision of chronic care management services.    SDOH (Social Determinants of Health) assessments performed: No See Care Plan activities for detailed interventions related to [facility])     Advanced Directives: See Care Plan and Vynca application for related entries.   Goals Addressed              This Visit's Progress     "I need a lift chair" (pt-stated)        CARE PLAN ENTRY (see longitudinal plan of care for additional care plan information)  Current Barriers:   Patient seeking lift chair.  States that she has contacted [employer] and was told that chair is covered.    Clinical Social Work Clinical Goal(s):   Over the next 90 days, patient will work with SW to address concerns related to need for DME/lift chair  Interventions:  In-basket message sent to Dr. Gilles Chiquito reminding her of documentation needed at office visit on 04/12/20.  Documentation must include:  The use of a lift is needed to transfer between bed and chair, wheelchair or commode and without it the patient would be bed confined.THIS CANNOT BE DOCUMENTED ON THE ORDER/RX OR OFFICE LETTERHEAD MUST BE IN OFFICE VISIT NOTES OR H&P ,CAN BE ADDED HAS ADDENDUM TO CLINICAL NOTES  Patient Self Care Activities:   Patient verbalizes understanding of plan to contact [employer] to confirm  coverage of DME, have face to face visit on 04/12/20  Self administers medications as prescribed  Calls provider office for new concerns or questions  Unable to perform IADLs independently  Initial goal documentation and Please see past updates related to this goal by clicking on the "Past Updates" button in the selected goal          The care management team will reach out to the patient again over the next 7 days.      Ronn Melena, [place] Coordination Social Worker [place] [phone]

## 2020-04-11 NOTE — Progress Notes (Signed)
Internal Medicine Clinic Resident  I have personally reviewed this encounter including the documentation in this note and/or discussed this patient with the care management provider. I will address any urgent items identified by the care management provider and will communicate my actions to the patient's PCP. I have reviewed the patient's CCM visit with my supervising attending, Dr Daryll Drown.  Andrew Au, MD 04/11/2020

## 2020-04-11 NOTE — Patient Instructions (Signed)
Visit Information  Goals Addressed              This Visit's Progress   .  "I need a lift chair" (pt-stated)        CARE PLAN ENTRY (see longitudinal plan of care for additional care plan information)  Current Barriers:  . Patient seeking lift chair.  States that she has contacted [employer] and was told that chair is covered.    Clinical Social Work Clinical Goal(s):  [name] Over the next 90 days, patient will work with SW to address concerns related to need for DME/lift chair  Interventions: . In-basket message sent to Dr. Gilles Chiquito reminding her of documentation needed at office visit on 04/12/20.  Documentation must include:  The use of a lift is needed to transfer between bed and chair, wheelchair or commode and without it the patient would be bed confined.THIS CANNOT BE DOCUMENTED ON THE ORDER/RX OR OFFICE LETTERHEAD MUST BE IN OFFICE VISIT NOTES OR H&P ,CAN BE ADDED HAS ADDENDUM TO CLINICAL NOTES  Patient Self Care Activities:  . Patient verbalizes understanding of plan to contact [employer] to confirm coverage of DME, have face to face visit on 04/12/20 . Self administers medications as prescribed . Calls provider office for new concerns or questions . Unable to perform IADLs independently  Initial goal documentation and Please see past updates related to this goal by clicking on the "Past Updates" button in the selected goal           The care management team will reach out to the patient again over the next 7 days.        Ronn Melena, [place] Coordination Social Worker [place] [phone]

## 2020-04-11 NOTE — Telephone Encounter (Signed)
  Chronic Care Management   Outreach Note  04/11/2020 Name: Shannon [name] MRN: [id number] DOB: March 01, 1956  Referred by: [name], MD Reason for referral : Care Coordination (PCS, DME)   An unsuccessful telephone outreach was attempted today. The patient was referred to the case management team for assistance with care management and care coordination.   Follow Up Plan: A HIPPA compliant phone message was left for the patient providing contact information and requesting a return call.      Ronn Melena, [place] Coordination Social Worker [place] [phone]

## 2020-04-12 ENCOUNTER — Ambulatory Visit: Payer: Medicare Other

## 2020-04-12 ENCOUNTER — Ambulatory Visit: Payer: Medicare Other | Admitting: Internal Medicine

## 2020-04-12 ENCOUNTER — Ambulatory Visit: Payer: Medicare Other | Admitting: Radiation Oncology

## 2020-04-12 ENCOUNTER — Telehealth: Payer: Self-pay | Admitting: Pulmonary Disease

## 2020-04-12 DIAGNOSIS — I10 Essential (primary) hypertension: Secondary | ICD-10-CM

## 2020-04-12 DIAGNOSIS — I5032 Chronic diastolic (congestive) heart failure: Secondary | ICD-10-CM

## 2020-04-12 DIAGNOSIS — G4733 Obstructive sleep apnea (adult) (pediatric): Secondary | ICD-10-CM | POA: Diagnosis not present

## 2020-04-12 DIAGNOSIS — R911 Solitary pulmonary nodule: Secondary | ICD-10-CM

## 2020-04-12 DIAGNOSIS — J449 Chronic obstructive pulmonary disease, unspecified: Secondary | ICD-10-CM | POA: Diagnosis not present

## 2020-04-12 DIAGNOSIS — J45909 Unspecified asthma, uncomplicated: Secondary | ICD-10-CM | POA: Diagnosis not present

## 2020-04-12 DIAGNOSIS — E041 Nontoxic single thyroid nodule: Secondary | ICD-10-CM

## 2020-04-12 DIAGNOSIS — R269 Unspecified abnormalities of gait and mobility: Secondary | ICD-10-CM | POA: Diagnosis not present

## 2020-04-12 NOTE — Patient Instructions (Signed)
Visit Information  Goals Addressed              This Visit's Progress   .  "I have just been diagnosed with lung cancer, I live alone in a 2 story townhouse, I have no local family and before I start treatment I am going to need help in my home and a hospital bed." (pt-stated)        [place] (see longitudinal plan of care for additional care plan information)  Current Barriers:  . Chronic Disease Management support, education, and care coordination needs related to CAD, HTN, HLD, COPD, CKD Stage 3, Anxiety, and Asthma- patient states the hospital bed was delivered but it is too uncomfortable to sleep in, she is requesting assist with getting a lift chair, she says she will have a simulation appointment at the cancer center on 04/01/20 in preparation to start radiation therapy, she also says she has been advised to start drinking Ensure supplements and to stay well hydrated while receiving radiation therapy. She says she is receiving Mom's Meals. She also says she has not chosen at least 2 agencies to accommodate [employer] staffing at [facility] request but says she will decide and make the call to [place] today. Patient voiced much appreciation for the assistance of the CCM team.   Clinical Goal(s) related to CAD, HTN, HLD, COPD, CKD Stage 3, Anxiety, and Asthma:  Over the next 30 days, patient will:  . Work with the care management team to address educational, disease management, and care coordination needs  . Begin or continue self health monitoring activities as directed today  work with care management team to secure DME and home care assistance . Call provider office for new or worsened signs and symptoms CAD, HTN, HLD, COPD, CKD Stage 3, Anxiety, and Asthma . Call care management team with questions or concerns . Verbalize basic understanding of patient centered plan of care established today  Interventions related to CAD, HTN, HLD, COPD, CKD Stage 3, Anxiety, and Asthma:    . [place] regarding status of services; request was sent to [facility] on 04/05/19 and they have not yet responded . Contacted [employer].  Representative was not able to locate request in their system; recommended call back to [facility] to ensure they have the correct NPI . [employer] again to provide correct NPI [id number].  Representative stated that request will be sent to this NPI . Called patient back to provide above update  Please see past updates related to this goal by clicking on the "Past Updates" button in the selected goal      .  "I need a lift chair" (pt-stated)        [place] (see longitudinal plan of care for additional care plan information)  Current Barriers:  . Patient seeking lift chair.  States that she has contacted [employer] and was told that chair is covered.    Clinical Social Work Clinical Goal(s):  [name] Over the next 90 days, patient will work with SW to address concerns related to need for DME/lift chair  Interventions: . Received call from patient regarding need for lift chair. . Informed patient that provider has been made aware of this request and documentation needed to accompany order . Reminded patient that Medicare typically requires a face to face visit . Sent community message to Skeet Latch with [employer] to determine if televist will suffice . Received response from Ms. Lewis; yes due to state  of emergency guidelines right now during [place].   Patient Self Care Activities:  . Patient verbalizes understanding of plan to contact [employer] to confirm coverage of DME, have face to face visit on 04/12/20 . Self administers medications as prescribed . Calls provider office for new concerns or questions . Unable to perform IADLs independently  IPlease see past updates related to this goal by clicking on the "Past Updates" button in the selected goal         Patient verbalizes understanding of instructions provided today.    The care management team will reach out to the patient again over the next 7 days.    Ronn Melena, [place] Coordination Social Worker [place] [phone]

## 2020-04-12 NOTE — Telephone Encounter (Signed)
lmtcb for pt. Pt has not been seen in over 30 days, so she will need to have an office visit and a qualifying walk on a POC performed in office.

## 2020-04-12 NOTE — Chronic Care Management (AMB) (Signed)
Care Management   Follow Up Note   04/12/2020 Name: Shannon [name] MRN: [id number] DOB: 1956/04/30  Referred by: [name], MD Reason for referral : Care Coordination (PCS, DME)   Shannon [name] is a 64 y.o. year old female who is a primary care patient of [name], MD. The care management team was consulted for assistance with care management and care coordination needs.    Review of patient status, including review of consultants reports, relevant laboratory and other test results, and collaboration with appropriate care team members and the patient's provider was performed as part of comprehensive patient evaluation and provision of chronic care management services.    SDOH (Social Determinants of Health) assessments performed: No See Care Plan activities for detailed interventions related to [facility])     Advanced Directives: See Care Plan and Vynca application for related entries.   Goals Addressed              This Visit's Progress   .  "I have just been diagnosed with lung cancer, I live alone in a 2 story townhouse, I have no local family and before I start treatment I am going to need help in my home and a hospital bed." (pt-stated)        [place] (see longitudinal plan of care for additional care plan information)  Current Barriers:  . Chronic Disease Management support, education, and care coordination needs related to CAD, HTN, HLD, COPD, CKD Stage 3, Anxiety, and Asthma- patient states the hospital bed was delivered but it is too uncomfortable to sleep in, she is requesting assist with getting a lift chair, she says she will have a simulation appointment at the cancer center on 04/01/20 in preparation to start radiation therapy, she also says she has been advised to start drinking Ensure supplements and to stay well hydrated while receiving radiation therapy. She says she is receiving Mom's Meals. She also says she has not chosen at least 2 agencies to  accommodate [employer] staffing at [facility] request but says she will decide and make the call to [place] today. Patient voiced much appreciation for the assistance of the CCM team.   Clinical Goal(s) related to CAD, HTN, HLD, COPD, CKD Stage 3, Anxiety, and Asthma:  Over the next 30 days, patient will:  . Work with the care management team to address educational, disease management, and care coordination needs  . Begin or continue self health monitoring activities as directed today  work with care management team to secure DME and home care assistance . Call provider office for new or worsened signs and symptoms CAD, HTN, HLD, COPD, CKD Stage 3, Anxiety, and Asthma . Call care management team with questions or concerns . Verbalize basic understanding of patient centered plan of care established today  Interventions related to CAD, HTN, HLD, COPD, CKD Stage 3, Anxiety, and Asthma:   . [place] regarding status of services; request was sent to [facility] on 04/05/19 and they have not yet responded . Contacted [employer].  Representative was not able to locate request in their system; recommended call back to [facility] to ensure they have the correct NPI . [employer] again to provide correct NPI [id number].  Representative stated that request will be sent to this NPI . Called patient back to provide above update  Please see past updates related to this goal by clicking on the "Past Updates" button in the selected goal      .  "  I need a lift chair" (pt-stated)        CARE PLAN ENTRY (see longitudinal plan of care for additional care plan information)  Current Barriers:  . Patient seeking lift chair.  States that she has contacted [employer] and was told that chair is covered.    Clinical Social Work Clinical Goal(s):  [name] Over the next 90 days, patient will work with SW to address concerns related to need for DME/lift chair  Interventions: . Received call  from patient regarding need for lift chair. . Informed patient that provider has been made aware of this request and documentation needed to accompany order . Reminded patient that Medicare typically requires a face to face visit . Sent community message to Skeet Latch with [employer] to determine if televist will suffice . Received response from Ms. Lewis; yes due to state of emergency guidelines right now during [place].   Patient Self Care Activities:  . Patient verbalizes understanding of plan to contact [employer] to confirm coverage of DME, have face to face visit on 04/12/20 . Self administers medications as prescribed . Calls provider office for new concerns or questions . Unable to perform IADLs independently  IPlease see past updates related to this goal by clicking on the "Past Updates" button in the selected goal          The care management team will reach out to the patient again over the next 7 days.      Ronn Melena, [place] Coordination Social Worker [place] [phone]

## 2020-04-12 NOTE — Progress Notes (Signed)
Patient no showed in person, did not answer phone for telehealth.  Will reschedule.

## 2020-04-12 NOTE — Progress Notes (Signed)
Internal Medicine Clinic Resident  I have personally reviewed this encounter including the documentation in this note and/or discussed this patient with the care management provider. I will address any urgent items identified by the care management provider and will communicate my actions to the patient's PCP. I have reviewed the patient's CCM visit with my supervising attending, Dr Heber [place].  Andrew Au, MD 04/12/2020

## 2020-04-15 ENCOUNTER — Telehealth: Payer: Medicare Other

## 2020-04-15 ENCOUNTER — Telehealth: Payer: Self-pay | Admitting: Internal Medicine

## 2020-04-15 ENCOUNTER — Ambulatory Visit
Admission: RE | Admit: 2020-04-15 | Discharge: 2020-04-15 | Disposition: A | Discharge status: Home / Self Care | Payer: Medicare Other | Source: Ambulatory Visit | Attending: Radiation Oncology | Admitting: Radiation Oncology

## 2020-04-15 ENCOUNTER — Inpatient Hospital Stay (HOSPITAL_COMMUNITY): Admission: RE | Admit: 2020-04-15 | Payer: Medicare Other | Source: Ambulatory Visit

## 2020-04-15 ENCOUNTER — Other Ambulatory Visit: Payer: Self-pay

## 2020-04-15 ENCOUNTER — Ambulatory Visit: Payer: Medicare Other | Admitting: Radiation Oncology

## 2020-04-15 DIAGNOSIS — Z510 Encounter for antineoplastic radiation therapy: Secondary | ICD-10-CM | POA: Diagnosis not present

## 2020-04-15 DIAGNOSIS — C3412 Malignant neoplasm of upper lobe, left bronchus or lung: Secondary | ICD-10-CM | POA: Diagnosis not present

## 2020-04-15 DIAGNOSIS — C3492 Malignant neoplasm of unspecified part of left bronchus or lung: Secondary | ICD-10-CM

## 2020-04-15 MED ORDER — DOXYCYCLINE HYCLATE 100 MG PO TABS
100.0000 mg | ORAL_TABLET | Freq: Two times a day (BID) | ORAL | 0 refills | Status: DC
Start: 2020-04-15 — End: 2020-08-20

## 2020-04-15 NOTE — Telephone Encounter (Signed)
Pls contact pt [phone] regarding medicine

## 2020-04-15 NOTE — Progress Notes (Signed)
  Radiation Oncology         [phone] ________________________________  Name: Shannon [name] MRN: [id number]  Date: 04/15/2020  DOB: 10/13/1955  Stereotactic Body Radiotherapy Treatment Procedure Note  NARRATIVE:  Shannon [name] was brought to the stereotactic radiation treatment machine and placed supine on the CT couch. The patient was set up for stereotactic body radiotherapy on the body fix pillow.  3D TREATMENT PLANNING AND DOSIMETRY:  The patient's radiation plan was reviewed and approved prior to starting treatment.  It showed 3-dimensional radiation distributions overlaid onto the planning CT.  The [facility] for the target structures as well as the organs at risk were reviewed. The documentation of this is filed in the radiation oncology EMR.  SIMULATION VERIFICATION:  The patient underwent CT imaging on the treatment unit.  These were carefully aligned to document that the ablative radiation dose would cover the target volume and maximally spare the nearby organs at risk according to the planned distribution.  SPECIAL TREATMENT PROCEDURE: Shannon [name] received high dose ablative stereotactic body radiotherapy to the planned target volume without unforeseen complications. Treatment was delivered uneventfully. The high doses associated with stereotactic body radiotherapy and the significant potential risks require careful treatment set up and patient monitoring constituting a special treatment procedure   STEREOTACTIC TREATMENT MANAGEMENT:  Following delivery, the patient was evaluated clinically. The patient tolerated treatment without significant acute effects, and was discharged to home in stable condition.    PLAN: Continue treatment as planned.  ________________________________  Blair Promise, PhD, MD  This document serves as a record of services personally performed by Gery Pray, MD. It was created on his behalf by Clerance Lav, a trained medical scribe. The creation of  this record is based on the scribe's personal observations and the provider's statements to them. This document has been checked and approved by the attending provider.

## 2020-04-15 NOTE — Telephone Encounter (Signed)
Doxycycline 100 mg twice daily for 7 days. Please schedule office visit after she completes radiation therapy

## 2020-04-15 NOTE — Telephone Encounter (Signed)
Spoke with the pt and notified of recs per Dr Elsworth Soho and she verbalized understanding  Order sent for Doxy and I have scheduled with TP since RA with no openings at all

## 2020-04-15 NOTE — Telephone Encounter (Signed)
Spoke with patient she states that Thursday of last week she started feeling bad and states that she has Sinusitis that has now dropped down into chest and she is trying not to get Bronchitis. Patient is currently doing radiation for lung cancer and is very weak. Patient states that she constantly has to clear her throat, has raspy voice and is asking if she can get an antibiotic sent into pharmacy.   Dr.Alva please advise

## 2020-04-15 NOTE — Progress Notes (Signed)
  Radiation Oncology         [phone] ________________________________  Name: Shannon [name] MRN: [id number]  Date: 04/17/2020  DOB: 08-18-1956  Stereotactic Body Radiotherapy Treatment Procedure Note  NARRATIVE:  Shannon [name] was brought to the stereotactic radiation treatment machine and placed supine on the CT couch. The patient was set up for stereotactic body radiotherapy on the body fix pillow.  3D TREATMENT PLANNING AND DOSIMETRY:  The patient's radiation plan was reviewed and approved prior to starting treatment.  It showed 3-dimensional radiation distributions overlaid onto the planning CT.  The [facility] for the target structures as well as the organs at risk were reviewed. The documentation of this is filed in the radiation oncology EMR.  SIMULATION VERIFICATION:  The patient underwent CT imaging on the treatment unit.  These were carefully aligned to document that the ablative radiation dose would cover the target volume and maximally spare the nearby organs at risk according to the planned distribution.  SPECIAL TREATMENT PROCEDURE: Shannon [name] Credit received high dose ablative stereotactic body radiotherapy to the planned target volume without unforeseen complications. Treatment was delivered uneventfully. The high doses associated with stereotactic body radiotherapy and the significant potential risks require careful treatment set up and patient monitoring constituting a special treatment procedure   STEREOTACTIC TREATMENT MANAGEMENT:  Following delivery, the patient was evaluated clinically. The patient tolerated treatment without significant acute effects, and was discharged to home in stable condition.    PLAN: Continue treatment as planned.  ________________________________  Blair Promise, PhD, MD  This document serves as a record of services personally performed by Gery Pray, MD. It was created on his behalf by Clerance Lav, a trained medical scribe. The creation of  this record is based on the scribe's personal observations and the provider's statements to them. This document has been checked and approved by the attending provider.

## 2020-04-15 NOTE — Telephone Encounter (Signed)
ATC patient left VM requesting call back to address symptoms.  DR. Elsworth Soho does have 2 afternoon appointments open at 2:30 and 2:45.   Primary Pulmonologist: Elsworth Soho Last office visit and with whom: 02/05/20, Elsworth Soho What do we see them for (pulmonary problems): Chronic Resp. Failure with hypoxia and asthma Last OV assessment/plan:     Assessment & Plan Note by Rigoberto Noel, MD at 02/05/2020 4:30 PM Author: Rigoberto Noel, MD Author Type: Physician Filed: 02/05/2020 4:30 PM  Note Status: Written Cosign: Cosign Not Required Encounter Date: 02/05/2020  Problem: Thyroid nodule  Editor: Rigoberto Noel, MD (Physician)               Await results of thyroid ultrasound -you may need biopsy of thyroid nodule.    Assessment & Plan Note by Rigoberto Noel, MD at 02/05/2020 4:28 PM Author: Rigoberto Noel, MD Author Type: Physician Filed: 02/05/2020 4:29 PM  Note Status: Written Cosign: Cosign Not Required Encounter Date: 02/05/2020  Problem: Moderate persistent asthma with exacerbation  Editor: Rigoberto Noel, MD (Physician)               Elizebeth Koller course of prednisone. Resume taking the Breo daily, we will send in prescription.  She has been resistant to taking this in the past.  I once again clarified the difference between maintenance and rescue inhaler need for her to take maintenance inhaler.  At the same time her obstruction seems to be fairly fixed and by physiology she is behaving more like COPD rather than asthma  With her low lung function I'm not sure that she'll be able to tolerate a resection procedure      Patient Instructions by Rigoberto Noel, MD at 02/05/2020 11:30 AM Author: Rigoberto Noel, MD Author Type: Physician Filed: 02/05/2020 4:28 PM  Note Status: Addendum Cosign: Cosign Not Required Encounter Date: 02/05/2020  Editor: Rigoberto Noel, MD (Physician)      Prior Versions: 1. Rigoberto Noel, MD (Physician) at 02/05/2020 12:42 PM - Signed     Elizebeth Koller course of prednisone. Start  back taking the [place] daily, we will send in prescription.  Await results of thyroid ultrasound -you may need biopsy of thyroid nodule.   we will proceed with setting up biopsy of lung nodule via bronchoscopy.  You will need to be put to sleep for this procedure    Instructions    Return in about 4 weeks (around 03/04/2020).  Finish course of prednisone. Start back taking the [place] daily, we will send in prescription.  Await results of thyroid ultrasound -you may need biopsy of thyroid nodule.   we will proceed with setting up biopsy of lung nodule via bronchoscopy.  You will need to be put to sleep for this procedure        Was appointment offered to patient (explain)?    Reason for call: sinusitis and cold, coughing up green mucus  (examples of things to ask: : When did symptoms start? Fever? Cough? Productive? Color to sputum? More sputum than usual? Wheezing? Have you needed increased oxygen? Are you taking your respiratory medications? What over the counter measures have you tried?)  Allergies  Allergen Reactions  . Tree Extract Swelling and Other (See Comments)    Swelling to eyes  . Augmentin [Amoxicillin-Pot Clavulanate] Other (See Comments)    Headache, dizzy  . Lisinopril Cough    Face/throat swelling  . Ciprofloxacin Hives    Immunization History  Administered Date(s) Administered  .  PFIZER SARS-COV-2 Vaccination 12/01/2019, 12/27/2019

## 2020-04-16 ENCOUNTER — Ambulatory Visit: Payer: Medicare Other

## 2020-04-16 DIAGNOSIS — I10 Essential (primary) hypertension: Secondary | ICD-10-CM

## 2020-04-16 DIAGNOSIS — R911 Solitary pulmonary nodule: Secondary | ICD-10-CM

## 2020-04-16 DIAGNOSIS — I5032 Chronic diastolic (congestive) heart failure: Secondary | ICD-10-CM

## 2020-04-16 NOTE — Telephone Encounter (Signed)
Returned call to patient. States she had sinusitis that felt like was becoming bronchitis. She reached out to her pulmonologist and received an antibiotic for doxycycline that she will p/u tomorrow.  States she has 2 more radiation treatments (tomorrow and Friday) then she is done.  She is requesting a call from PCP to discuss results of thyroid bx done 03/19/2020.  She is requesting call from CCM with update on status of lift chair.  Hubbard Hartshorn, BSN, RN-BC

## 2020-04-16 NOTE — Telephone Encounter (Signed)
She needs a telehealth visit scheduled.   I am on inpatient and she did not pick up for telehealth visit on Friday.   She needs to have a telehealth visit with me to get the documentation re: the lift chair.  I believe she has been told this by CCM multiple times.   Can you please call her and get her to schedule a telehealth with me one day this week at 4pm?  If she does not answer, I cannot get the documentation needed to order the lift chair.   Thanks

## 2020-04-16 NOTE — Telephone Encounter (Signed)
-----   Message from [employer] sent at 04/16/2020  3:55 PM EDT ----- Hi Dr. Daryll Drown.  Just saw your pt call message.    Nurse pool, Please let me know if I need to call her back to schedule visit or if one of you will.     Thank you,  Museum/gallery conservator ----- Message ----- From: Ronn Melena Sent: 04/16/2020   3:42 PM EDT To: [name], MD, Barrington Ellison, RN, #  Hi Dr. Daryll Drown.  I just got off the phone with Ms. Santerre and need clarification about obtaining a lift chair.  I know that she cancelled her appointment with you last week but she seems to be under the impresson that you will be following up with her via telehealth.  I told her that I don't see an appointment so her understanding is probably inaccurate. Please let me know if I need to assist with getting her scheduled for another visit to address the need for this DME.  Thank you,     Ronn Melena, [place] Coordination Social Worker [place] [phone]

## 2020-04-17 ENCOUNTER — Ambulatory Visit
Admission: RE | Admit: 2020-04-17 | Discharge: 2020-04-17 | Disposition: A | Discharge status: Home / Self Care | Payer: Medicare Other | Source: Ambulatory Visit | Attending: Radiation Oncology | Admitting: Radiation Oncology

## 2020-04-17 ENCOUNTER — Encounter (HOSPITAL_COMMUNITY): Payer: Medicare Other

## 2020-04-17 ENCOUNTER — Other Ambulatory Visit: Payer: Self-pay

## 2020-04-17 DIAGNOSIS — Z510 Encounter for antineoplastic radiation therapy: Secondary | ICD-10-CM | POA: Diagnosis not present

## 2020-04-17 DIAGNOSIS — C3492 Malignant neoplasm of unspecified part of left bronchus or lung: Secondary | ICD-10-CM

## 2020-04-17 DIAGNOSIS — C3412 Malignant neoplasm of upper lobe, left bronchus or lung: Secondary | ICD-10-CM | POA: Diagnosis not present

## 2020-04-17 NOTE — Patient Instructions (Signed)
Visit Information  Goals Addressed              This Visit's Progress   .  "I have just been diagnosed with lung cancer, I live alone in a 2 story townhouse, I have no local family and before I start treatment I am going to need help in my home and a hospital bed." (pt-stated)        [place] (see longitudinal plan of care for additional care plan information)  Current Barriers:  . Chronic Disease Management support, education, and care coordination needs related to CAD, HTN, HLD, COPD, CKD Stage 3, Anxiety, and Asthma- patient states the hospital bed was delivered but it is too uncomfortable to sleep in, she is requesting assist with getting a lift chair, she says she will have a simulation appointment at the cancer center on 04/01/20 in preparation to start radiation therapy, she also says she has been advised to start drinking Ensure supplements and to stay well hydrated while receiving radiation therapy. She says she is receiving Mom's Meals. She also says she has not chosen at least 2 agencies to accommodate [employer] staffing at [facility] request but says she will decide and make the call to [place] today. Patient voiced much appreciation for the assistance of the CCM team.   Clinical Goal(s) related to CAD, HTN, HLD, COPD, CKD Stage 3, Anxiety, and Asthma:  Over the next 30 days, patient will:  . Work with the care management team to address educational, disease management, and care coordination needs  . Begin or continue self health monitoring activities as directed today  work with care management team to secure DME and home care assistance . Call provider office for new or worsened signs and symptoms CAD, HTN, HLD, COPD, CKD Stage 3, Anxiety, and Asthma . Call care management team with questions or concerns . Verbalize basic understanding of patient centered plan of care established today  Interventions related to CAD, HTN, HLD, COPD, CKD Stage 3, Anxiety, and Asthma:    . [place] regarding status of request for services;  [name] Informed patient that [facility] is unable to accommodate due to lack of staffing.   . Informed patient that, per [place] representative, patient must contact them again to provide alternate choice for agency even though she has already provided them with two alternate choices . Provided patient with contact number for [facility] . Informed patient that CCM BSW will follow up with [facility] before the end of the week regarding status of request to newly selected agency   Please see past updates related to this goal by clicking on the "Past Updates" button in the selected goal      .  "I need a lift chair" (pt-stated)        [place] (see longitudinal plan of care for additional care plan information)  Current Barriers:  . Patient seeking lift chair.  States that she has contacted [employer] and was told that chair is covered.    Clinical Social Work Clinical Goal(s):  [name] Over the next 90 days, patient will work with SW to address concerns related to need for DME/lift chair  Interventions: . In-basket message sent to Dr. Gilles Chiquito as patient is under impression that she will be contacted via telehealth for purpose of lift chair documentation . Informed patient that no appointment is showing so this is likely inaccurate . Informed patient that another appointment will likely need to be scheduled  to address this need for DME . Informed patient that CCM BSW can assist with scheduling of this visit if needed.  . Received response from Dr. Daryll Drown that new appointment needs to be scheduled   Patient Self Care Activities:  . Self administers medications as prescribed . Calls provider office for new concerns or questions . Unable to perform IADLs independently  IPlease see past updates related to this goal by clicking on the "Past Updates" button in the selected goal         Patient verbalizes understanding of  instructions provided today.   Telephone follow up appointment with care management team member scheduled for:04/18/20      Ronn Melena, [place] Coordination Social Worker [place] [phone]

## 2020-04-17 NOTE — Progress Notes (Signed)
Internal Medicine Clinic Resident  I have personally reviewed this encounter including the documentation in this note and/or discussed this patient with the care management provider. I will address any urgent items identified by the care management provider and will communicate my actions to the patient's PCP. I have reviewed the patient's CCM visit with my supervising attending, Dr Vincent.  Alizza Sacra, MD 04/17/2020    

## 2020-04-17 NOTE — Progress Notes (Signed)
Internal Medicine Clinic Attending  CCM services provided by the care management provider and their documentation were discussed with Dr. Bridgett Larsson. We reviewed the pertinent findings, urgent action items addressed by the resident and non-urgent items to be addressed by the PCP.  I agree with the assessment, diagnosis, and plan of care documented in the CCM and resident's note.  Gilles Chiquito, MD 04/17/2020

## 2020-04-17 NOTE — Chronic Care Management (AMB) (Signed)
Care Management   Follow Up Note   04/17/2020 Name: Shannon [name] MRN: [id number] DOB: 09/30/1955  Referred by: [name], MD Reason for referral : Care Coordination (PCS, DME)   Shannon [name] is a 64 y.o. year old female who is a primary care patient of [name], MD. The care management team was consulted for assistance with care management and care coordination needs.    Review of patient status, including review of consultants reports, relevant laboratory and other test results, and collaboration with appropriate care team members and the patient's provider was performed as part of comprehensive patient evaluation and provision of chronic care management services.    SDOH (Social Determinants of Health) assessments performed: No See Care Plan activities for detailed interventions related to [facility])     Advanced Directives: See Care Plan and Vynca application for related entries.   Goals Addressed              This Visit's Progress   .  "I have just been diagnosed with lung cancer, I live alone in a 2 story townhouse, I have no local family and before I start treatment I am going to need help in my home and a hospital bed." (pt-stated)        [place] (see longitudinal plan of care for additional care plan information)  Current Barriers:  . Chronic Disease Management support, education, and care coordination needs related to CAD, HTN, HLD, COPD, CKD Stage 3, Anxiety, and Asthma- patient states the hospital bed was delivered but it is too uncomfortable to sleep in, she is requesting assist with getting a lift chair, she says she will have a simulation appointment at the cancer center on 04/01/20 in preparation to start radiation therapy, she also says she has been advised to start drinking Ensure supplements and to stay well hydrated while receiving radiation therapy. She says she is receiving Mom's Meals. She also says she has not chosen at least 2 agencies to  accommodate [employer] staffing at [facility] request but says she will decide and make the call to [place] today. Patient voiced much appreciation for the assistance of the CCM team.   Clinical Goal(s) related to CAD, HTN, HLD, COPD, CKD Stage 3, Anxiety, and Asthma:  Over the next 30 days, patient will:  . Work with the care management team to address educational, disease management, and care coordination needs  . Begin or continue self health monitoring activities as directed today  work with care management team to secure DME and home care assistance . Call provider office for new or worsened signs and symptoms CAD, HTN, HLD, COPD, CKD Stage 3, Anxiety, and Asthma . Call care management team with questions or concerns . Verbalize basic understanding of patient centered plan of care established today  Interventions related to CAD, HTN, HLD, COPD, CKD Stage 3, Anxiety, and Asthma:   . [place] regarding status of request for services;  [name] Informed patient that [facility] is unable to accommodate due to lack of staffing.   . Informed patient that, per [place] representative, patient must contact them again to provide alternate choice for agency even though she has already provided them with two alternate choices . Provided patient with contact number for [facility] . Informed patient that CCM BSW will follow up with [facility] before the end of the week regarding status of request to newly selected agency   Please see past updates related to this goal  by clicking on the "Past Updates" button in the selected goal      .  "I need a lift chair" (pt-stated)        [place] (see longitudinal plan of care for additional care plan information)  Current Barriers:  . Patient seeking lift chair.  States that she has contacted [employer] and was told that chair is covered.    Clinical Social Work Clinical Goal(s):  [name] Over the next 90 days, patient will work with SW to address  concerns related to need for DME/lift chair  Interventions: . In-basket message sent to Dr. Gilles Chiquito as patient is under impression that she will be contacted via telehealth for purpose of lift chair documentation . Informed patient that no appointment is showing so this is likely inaccurate . Informed patient that another appointment will likely need to be scheduled to address this need for DME . Informed patient that CCM BSW can assist with scheduling of this visit if needed.  . Received response from Dr. Daryll Drown that new appointment needs to be scheduled   Patient Self Care Activities:  . Self administers medications as prescribed . Calls provider office for new concerns or questions . Unable to perform IADLs independently  IPlease see past updates related to this goal by clicking on the "Past Updates" button in the selected goal          Telephone follow up appointment with care management team member scheduled for:04/18/20     Ronn Melena, [place] Coordination Social Worker [place] [phone]

## 2020-04-18 ENCOUNTER — Ambulatory Visit (HOSPITAL_COMMUNITY): Payer: Medicare Other

## 2020-04-18 ENCOUNTER — Ambulatory Visit: Payer: Medicare Other | Admitting: Radiation Oncology

## 2020-04-18 ENCOUNTER — Ambulatory Visit: Payer: Medicare Other

## 2020-04-18 ENCOUNTER — Telehealth (HOSPITAL_COMMUNITY): Payer: Self-pay | Admitting: Cardiovascular Disease

## 2020-04-18 DIAGNOSIS — I10 Essential (primary) hypertension: Secondary | ICD-10-CM

## 2020-04-18 DIAGNOSIS — I5032 Chronic diastolic (congestive) heart failure: Secondary | ICD-10-CM

## 2020-04-18 DIAGNOSIS — R911 Solitary pulmonary nodule: Secondary | ICD-10-CM

## 2020-04-18 NOTE — Telephone Encounter (Signed)
Left message to call back  

## 2020-04-18 NOTE — Telephone Encounter (Signed)
Patient called and cancelled Myoview and wants to talk to Dr. Oval Linsey first. Order will be cancelled and if pt decides to schedule we can reinstate the order.

## 2020-04-18 NOTE — Patient Instructions (Addendum)
Visit Information  Goals Addressed              This Visit's Progress   .  "I need a lift chair" (pt-stated)        CARE PLAN ENTRY (see longitudinal plan of care for additional care plan information)  Current Barriers:  . Patient seeking lift chair.  States that she has contacted [employer] and was told that chair is covered.    Clinical Social Work Clinical Goal(s):  [name] Over the next 90 days, patient will work with SW to address concerns related to need for DME/lift chair  Interventions: . Assisted patient in scheduling telehealth visit with Dr. Daryll Drown on 04/22/20 @ 4:00 PM  Patient Self Care Activities:  . Self administers medications as prescribed . Calls provider office for new concerns or questions . Unable to perform IADLs independently  IPlease see past updates related to this goal by clicking on the "Past Updates" button in the selected goal         Patient verbalizes understanding of instructions provided today.   The patient has been provided with contact information for the care management team and has been advised to call with any health related questions or concerns.   Ronn Melena, [place] Coordination Social Worker [place] [phone]

## 2020-04-18 NOTE — Telephone Encounter (Signed)
#   Pre-surgical risk: Shannon [name] could potentially need a lobectomy in the future.  She has a lot of underlying medical conditions that make her higher risk for surgery.  After an outpatient bronchoscopy she had hypoxia and difficulty weaning from the ventilator.  Pulmonary work-up is pending.  We already ordered a Lexiscan Myoview in the past.  She keeps canceling it.  We explained that she would need this for surgical clearance prior to a major intrathoracic surgery, especially given that she is unable to achieve 4 METS without dyspnea.  She expressed understanding.   Upcoming appt with Dr. Oval Linsey.

## 2020-04-18 NOTE — Progress Notes (Signed)
Internal Medicine Clinic Resident  I have personally reviewed this encounter including the documentation in this note and/or discussed this patient with the care management provider. I will address any urgent items identified by the care management provider and will communicate my actions to the patient's PCP. I have reviewed the patient's CCM visit with my supervising attending, Dr Vincent.  Manie Bealer, MD 04/18/2020    

## 2020-04-18 NOTE — Progress Notes (Signed)
Internal Medicine Clinic Attending  CCM services provided by the care management provider and their documentation were discussed with Dr. Basaraba. We reviewed the pertinent findings, urgent action items addressed by the resident and non-urgent items to be addressed by the PCP.  I agree with the assessment, diagnosis, and plan of care documented in the CCM and resident's note.  Tahisha Hakim Thomas Lateka Rady, MD 04/18/2020  

## 2020-04-18 NOTE — Chronic Care Management (AMB) (Signed)
  Care Management   Follow Up Note   04/18/2020 Name: Shannon [name] MRN: [id number] DOB: 29-Sep-1955  Referred by: [name], MD Reason for referral : Care Coordination (DME)   Shannon [name] is a 64 y.o. year old female who is a primary care patient of [name], MD. The care management team was consulted for assistance with care management and care coordination needs.    Review of patient status, including review of consultants reports, relevant laboratory and other test results, and collaboration with appropriate care team members and the patient's provider was performed as part of comprehensive patient evaluation and provision of chronic care management services.    SDOH (Social Determinants of Health) assessments performed: No See Care Plan activities for detailed interventions related to [facility])     Advanced Directives: See Care Plan and Vynca application for related entries.   Goals Addressed              This Visit's Progress   .  "I need a lift chair" (pt-stated)        CARE PLAN ENTRY (see longitudinal plan of care for additional care plan information)  Current Barriers:  . Patient seeking lift chair.  States that she has contacted [employer] and was told that chair is covered.    Clinical Social Work Clinical Goal(s):  [name] Over the next 90 days, patient will work with SW to address concerns related to need for DME/lift chair  Interventions: . Assisted patient in scheduling telehealth visit with Dr. Daryll Drown on 04/22/20 @ 4:00 PM  Patient Self Care Activities:  . Self administers medications as prescribed . Calls provider office for new concerns or questions . Unable to perform IADLs independently  IPlease see past updates related to this goal by clicking on the "Past Updates" button in the selected goal          The patient has been provided with contact information for the care management team and has been advised to call with any health  related questions or concerns.     Ronn Melena, [place] Coordination Social Worker [place] [phone]

## 2020-04-19 ENCOUNTER — Ambulatory Visit: Payer: Medicare Other | Admitting: Radiation Oncology

## 2020-04-21 NOTE — Progress Notes (Signed)
Internal Medicine Clinic Attending  CCM services provided by the care management provider and their documentation were discussed with Dr. Basaraba. We reviewed the pertinent findings, urgent action items addressed by the resident and non-urgent items to be addressed by the PCP.  I agree with the assessment, diagnosis, and plan of care documented in the CCM and resident's note.  Buckley Bradly Thomas Shalisha Clausing, MD 04/21/2020  

## 2020-04-22 ENCOUNTER — Ambulatory Visit
Admission: RE | Admit: 2020-04-22 | Discharge: 2020-04-22 | Disposition: A | Discharge status: Home / Self Care | Payer: Medicare Other | Source: Ambulatory Visit | Attending: Radiation Oncology | Admitting: Radiation Oncology

## 2020-04-22 ENCOUNTER — Ambulatory Visit (HOSPITAL_COMMUNITY): Payer: Medicare Other

## 2020-04-22 ENCOUNTER — Ambulatory Visit (INDEPENDENT_AMBULATORY_CARE_PROVIDER_SITE_OTHER): Payer: Medicare Other | Admitting: Internal Medicine

## 2020-04-22 ENCOUNTER — Encounter: Payer: Self-pay | Admitting: Radiation Oncology

## 2020-04-22 ENCOUNTER — Other Ambulatory Visit: Payer: Self-pay

## 2020-04-22 DIAGNOSIS — I10 Essential (primary) hypertension: Secondary | ICD-10-CM | POA: Diagnosis not present

## 2020-04-22 DIAGNOSIS — C3492 Malignant neoplasm of unspecified part of left bronchus or lung: Secondary | ICD-10-CM

## 2020-04-22 DIAGNOSIS — Z510 Encounter for antineoplastic radiation therapy: Secondary | ICD-10-CM | POA: Diagnosis not present

## 2020-04-22 DIAGNOSIS — E041 Nontoxic single thyroid nodule: Secondary | ICD-10-CM

## 2020-04-22 DIAGNOSIS — C3412 Malignant neoplasm of upper lobe, left bronchus or lung: Secondary | ICD-10-CM | POA: Diagnosis not present

## 2020-04-22 NOTE — Progress Notes (Signed)
°  Radiation Oncology         ([phone]) [phone] ________________________________  Name: Shannon [name] MRN: [id number]  Date: 04/22/2020  DOB: 04/07/1956  Stereotactic Body Radiotherapy Treatment Procedure Note  NARRATIVE:  Shannon [name] was brought to the stereotactic radiation treatment machine and placed supine on the CT couch. The patient was set up for stereotactic body radiotherapy on the body fix pillow.  3D TREATMENT PLANNING AND DOSIMETRY:  The patient's radiation plan was reviewed and approved prior to starting treatment.  It showed 3-dimensional radiation distributions overlaid onto the planning CT.  The [facility] for the target structures as well as the organs at risk were reviewed. The documentation of this is filed in the radiation oncology EMR.  SIMULATION VERIFICATION:  The patient underwent CT imaging on the treatment unit.  These were carefully aligned to document that the ablative radiation dose would cover the target volume and maximally spare the nearby organs at risk according to the planned distribution.  SPECIAL TREATMENT PROCEDURE: Shannon [name] received high dose ablative stereotactic body radiotherapy to the planned target volume without unforeseen complications. Treatment was delivered uneventfully. The high doses associated with stereotactic body radiotherapy and the significant potential risks require careful treatment set up and patient monitoring constituting a special treatment procedure   STEREOTACTIC TREATMENT MANAGEMENT:  Following delivery, the patient was evaluated clinically. The patient tolerated treatment without significant acute effects, and was discharged to home in stable condition.    PLAN: Routine follow-up with radiation oncology in one month. ________________________________  Blair Promise, PhD, MD  This document serves as a record of services personally performed by Gery Pray, MD. It was created on his behalf by Clerance Lav, a trained medical  scribe. The creation of this record is based on the scribe's personal observations and the provider's statements to them. This document has been checked and approved by the attending provider.

## 2020-04-22 NOTE — Assessment & Plan Note (Signed)
She reports that her blood pressure is doing much better.  She has had a history of labile blood pressure.  We will continue her current regimen.

## 2020-04-22 NOTE — Assessment & Plan Note (Signed)
We discussed her results today.  Initial biopsy was [facility] which is indeterminate.  Pathology (Afirma Banning) came back as benign with low chance of malignancy.   Will plan to repeat US in 12-24 months.

## 2020-04-22 NOTE — Patient Instructions (Signed)
Given over phone.

## 2020-04-22 NOTE — Progress Notes (Signed)
[facility] Internal Medicine Residency Telephone Encounter Continuity Care Appointment  HPI:   This telephone encounter was created for [name] on 04/22/2020 for the following purpose/cc follow up of thyroid nodule and need for lift chair.  Ms. Stutz reports a few things to speak about today - -   Today was her last XRT treatment and she feels she got excellent care.   Dr. Elsworth Soho gave her doxycycline for a "mild" sinusitis and she is doing well with this.  She feels that she is breathing better.  She notes her BP is also better.  110s/70s.  She would like to discuss her thyroid biopsies. We discussed her results which were low liklihood for carcinoma, follow up in 1-2 years with an ultrasound.   She needs a chair lift to help with her transition to and from bed, commode, etc.  She is more fatigued now.   The use of a lift is needed to transfer between bed and chair, and commode and without it the patient would be bed confined.   Past Medical History:  Past Medical History:  Diagnosis Date  . Anemia   . Anxiety    HX PANIC ATTACKS  . Arthritis    "starting to; in my hands" (07/09/2015)  . Asthma   . Atrial fibrillation ([place])   . Atrial flutter, paroxysmal ([place])   . Bloated abdomen   . CFS (chronic fatigue syndrome)   . Chewing difficulty   . Chronic asthma with acute exacerbation    "I have chronic asthma all the time; sometimes exacerbations" (07/09/2015)  . Chronic lower back pain   . COPD (chronic obstructive pulmonary disease) ([place])   . Cyst of right kidney    "3 of them; dx'd in ~ 01/2015"  . Dyspnea   . GERD (gastroesophageal reflux disease)   . Heart murmur   . History of blood transfusion    "related to my brain surgery I think"  . History of pulmonary embolism 07/09/2015  . HIV antibody positive ([place])   . HIV disease ([place])   . Hyperlipidemia   . Hypertension   . Leg edema   . Lipodystrophy   . Mild CAD 2013  . Multiple thyroid nodules   . Osteopenia    . Palpitations   . Pneumonia 07/09/2015  . Shingles   . Sleep apnea    "never completed part 2 of study; never wore mask" (07/09/2015)  . Vitamin B 12 deficiency   . Vitamin D deficiency       ROS:   Fatigue, increased sleep.  Cough, but this is improved, sinus discharge, coughing up green stuff, reports "kidneys bothering her," She is drinking more water purposefully, she has had constipation.  Otherwise negative.    Assessment / Plan / Recommendations:   Please see A&P under problem oriented charting for assessment of the patient's acute and chronic medical conditions.   As always, pt is advised that if symptoms worsen or new symptoms arise, they should go to an urgent care facility or to to ER for further evaluation.   Consent and Medical Decision Making:   This is a telephone encounter between LACHRISHA ZIEBARTH and Gilles Chiquito on 04/22/2020 for Follow up of thyroid nodule. The visit was conducted with the patient located at home and Gilles Chiquito at [facility]. The patient's identity was confirmed using their DOB and current address. The patient has consented to being evaluated through a telephone encounter and understands the associated risks (an  examination cannot be done and the patient may need to come in for an appointment) / benefits (allows the patient to remain at home, decreasing exposure to coronavirus). I personally spent 15 minutes on medical discussion.     Follow up in 1-2 months for in person visit.   She has been following with CCM and she has expressed need for a lift chair.  The use of a lift is needed to transfer between bed and chair, wheelchair or commode and without it the patient would be bed confined.  I believe lift chair will significantly improver her QOL.

## 2020-04-23 ENCOUNTER — Ambulatory Visit (HOSPITAL_COMMUNITY): Payer: Medicare Other

## 2020-04-24 ENCOUNTER — Other Ambulatory Visit: Payer: Self-pay | Admitting: Internal Medicine

## 2020-04-24 ENCOUNTER — Other Ambulatory Visit: Payer: Self-pay

## 2020-04-24 ENCOUNTER — Encounter: Payer: Self-pay | Admitting: Adult Health

## 2020-04-24 ENCOUNTER — Ambulatory Visit (INDEPENDENT_AMBULATORY_CARE_PROVIDER_SITE_OTHER): Payer: Medicare Other | Admitting: Adult Health

## 2020-04-24 DIAGNOSIS — J9611 Chronic respiratory failure with hypoxia: Secondary | ICD-10-CM | POA: Diagnosis not present

## 2020-04-24 DIAGNOSIS — J449 Chronic obstructive pulmonary disease, unspecified: Secondary | ICD-10-CM | POA: Diagnosis not present

## 2020-04-24 DIAGNOSIS — J441 Chronic obstructive pulmonary disease with (acute) exacerbation: Secondary | ICD-10-CM

## 2020-04-24 DIAGNOSIS — C3492 Malignant neoplasm of unspecified part of left bronchus or lung: Secondary | ICD-10-CM

## 2020-04-24 DIAGNOSIS — B20 Human immunodeficiency virus [HIV] disease: Secondary | ICD-10-CM

## 2020-04-24 MED ORDER — ALBUTEROL SULFATE (2.5 MG/3ML) 0.083% IN NEBU: 2.5000 mg | INHALATION_SOLUTION | Freq: Four times a day (QID) | RESPIRATORY_TRACT | 3 refills | Status: DC | PRN

## 2020-04-24 NOTE — Assessment & Plan Note (Signed)
Continue follow-up with infectious disease 

## 2020-04-24 NOTE — Progress Notes (Signed)
@Patient  ID: Shannon [name], female    DOB: 1956-03-24, 64 y.o.   MRN: [id number]  Chief Complaint  Patient presents with  . Follow-up    Referring provider: [name], MD  HPI: 64 yo female never smoker followed for severe persistent chronic obstructive asthma, chronic respiratory failure on oxygen at 2l/m at bedtime and activity , Allergic rhinitis.  Newly diagnosed lung cancer June 2021 Medical history significant for HIV disease followed by infectious disease.  Lipodystrophy, atrial flutter on Xarelto  TEST/EVENTS :  CT chest 01/10/2020 masslike consolidation in the left upper lobe measuring 2.7 x 1.9 x 1.9 cm  PET scan showed hypermetabolism in this lesion and also in a 9 mm left axillary lymph node and in the left thyroid nodule  10/2019 that revealed LVEF 60 to 65% with mild LVH and grade 1 diastolic dysfunction. The ascending aorta was normal in size.  Chest xray 05/2019 -cardiomegaly and vascular congestion without edema. No consolidative process, pneumothorax or pleural effusion. No acute or focal bony abnormality.  CTa chest 12/2016 , 01/2017 >neg for PE , multifocal scarring bilaterally  PFT (12/04/15) FEV1/FVC 50%, FEV1 0.64 31%.+++BD response. DLCO 66%.  Spirometry 09/2017-ratio 59, FEV1 24%, FVC 32%  Spirometry 05/2018 ratio 55, FEV1 28%, FVC 38%   Sleep study in 04/2016 was (-) for OSA  04/24/2020 Follow up : Asthma , lung cancer , O2 RF  Patient presents for a 56-month follow-up.  Patient has underlying severe chronic obstructive asthma.  She has had difficult to control with significant shortness of breath and intermittent exacerbations.  She has been tried on multiple inhalers including Trelegy, Symbicort and Breo which she has no perceived benefit and feels that they actually make her symptoms worse.  She says she can tolerate albuterol but does not use it very much.  She says recently she has had less shortness of breath.  She remains very weak since  completing radiation.  She does have an albuterol inhaler and nebulizer.  Patient is a never smoker.  Was having increased shortness of breath subsequent chest x-ray on Jan 04, 2020 showed 2.5 x 1.9 cm left mid lung mass.  Chest x-ray May 26, 2019 showed no consolidative process or acute finding.  Subsequent CT chest Jan 10, 2020 showed a masslike consolidation measuring 2.7 x 1.9 x 1.9 cm.  Follow-up PET scan on January 26, 2020 showed hypermetabolic left upper lobe pulmonary mass.  Left axillary node mildly hypermetabolic.Hypermetabolic left thyroid lobe and nodule.  Patient underwent navigational bronchoscopy with final pathology consistent with adenocarcinoma.  Patient was seen by thoracic surgery, radiation oncology and oncology.  She was not felt to be a surgical candidate due to her severe airflow obstruction and multiple comorbidities. Patient was seen by radiation oncology and recommended for XRT.  She has completed her 5 treatments earlier this week. Patient has recently had a COPD/sinusitis flare.  She had some cough congestion and nasal symptoms.  She was given a doxycycline prescription for 1 week.  Patient says she has 1-2 additional days left.  She says she is feeling some better.  But continues to be short of breath.  She says she has low energy.She denies any hemoptysis, chest pain, orthopnea or edema.  Says she is eating well with a great appetite.     Allergies  Allergen Reactions  . Tree Extract Swelling and Other (See Comments)    Swelling to eyes  . Augmentin [Amoxicillin-Pot Clavulanate] Other (See Comments)  Headache, dizzy  . Lisinopril Cough    Face/throat swelling  . Ciprofloxacin Hives    Immunization History  Administered Date(s) Administered  . PFIZER SARS-COV-2 Vaccination 12/01/2019, 12/27/2019    Past Medical History:  Diagnosis Date  . Anemia   . Anxiety    HX PANIC ATTACKS  . Arthritis    "starting to; in my hands" (07/09/2015)  . Asthma   . Atrial  fibrillation ([place])   . Atrial flutter, paroxysmal ([place])   . Bloated abdomen   . CFS (chronic fatigue syndrome)   . Chewing difficulty   . Chronic asthma with acute exacerbation    "I have chronic asthma all the time; sometimes exacerbations" (07/09/2015)  . Chronic lower back pain   . COPD (chronic obstructive pulmonary disease) ([place])   . Cyst of right kidney    "3 of them; dx'd in ~ 01/2015"  . Dyspnea   . GERD (gastroesophageal reflux disease)   . Heart murmur   . History of blood transfusion    "related to my brain surgery I think"  . History of pulmonary embolism 07/09/2015  . HIV antibody positive ([place])   . HIV disease ([place])   . Hyperlipidemia   . Hypertension   . Leg edema   . Lipodystrophy   . Mild CAD 2013  . Multiple thyroid nodules   . Osteopenia   . Palpitations   . Pneumonia 07/09/2015  . Shingles   . Sleep apnea    "never completed part 2 of study; never wore mask" (07/09/2015)  . Vitamin B 12 deficiency   . Vitamin D deficiency     Tobacco History: Social History   Tobacco Use  Smoking Status Never Smoker  Smokeless Tobacco Never Used   Counseling given: Not Answered   Outpatient Medications Prior to Visit  Medication Sig Dispense Refill  . acetaminophen (TYLENOL) 325 MG tablet Take 325 mg by mouth every 6 (six) hours as needed (FOR PAIN).    . Ascorbic Acid (VITAMIN C) 1000 MG tablet Take 1,000 mg by mouth every other day.     . b complex vitamins tablet Take 1 tablet by mouth daily.    [name] BIOTIN PO Take 1 tablet by mouth daily as needed (for supplementation).     . calcium carbonate (OSCAL) 1500 (600 Ca) MG TABS tablet Take 600 mg of elemental calcium by mouth daily with breakfast.    . Cholecalciferol (SM VITAMIN D3) 100 MCG (4000 UT) CAPS Take 1 capsule (4,000 Units total) by mouth daily. 30 capsule 0  . cloNIDine (CATAPRES) 0.1 MG tablet Take 1 tablet (0.1 mg total) by mouth every 12 (twelve) hours. 180 tablet 1  . cyanocobalamin (,VITAMIN B-12,)  1000 MCG/ML injection ADMINISTER 1 ML(1000 MCG) IN THE MUSCLE EVERY 30 DAYS 1 mL 0  . cyanocobalamin (,VITAMIN B-12,) 1000 MCG/ML injection Inject 1 mL (1,000 mcg total) into the muscle every 30 (thirty) days. ADMINISTER 1 ML(1000 MCG) IN THE MUSCLE EVERY 30 DAYS 1 mL 1  . diltiazem (CARDIZEM CD) 240 MG 24 hr capsule TAKE 1 CAPSULE(240 MG) BY MOUTH DAILY (Patient taking differently: Take 240 mg by mouth daily. ) 90 capsule 3  . doxycycline (VIBRA-TABS) 100 MG tablet Take 1 tablet (100 mg total) by mouth 2 (two) times daily. 14 tablet 0  . furosemide (LASIX) 20 MG tablet Take 1 tablet (20 mg total) by mouth daily. (Patient taking differently: Take 20 mg by mouth daily. As needed) 90 tablet 0  . MAGNESIUM  PO Take 1 tablet by mouth every other day.     . Melatonin 5 MG TABS Take 5 mg by mouth at bedtime as needed (for sleep).    . Misc. Devices (PULSE OXIMETER FOR FINGER) MISC 1 Units by Does not apply route as needed. 1 each 0  . Mouthwashes (BIOTENE DRY MOUTH GENTLE) LIQD Use as directed 1 Dose in the mouth or throat 2 (two) times daily as needed (dry mouth).    [name] omeprazole (PRILOSEC) 40 MG capsule Take 1 capsule (40 mg total) by mouth daily as needed (heart burn).    . OXYGEN Inhale 1.5-2 L into the lungs See admin instructions. Uses at bedtime and through the day as needed    . polyethylene glycol (MIRALAX / GLYCOLAX) 17 g packet Take 17 g by mouth daily as needed for mild constipation.    [name] PROAIR HFA 108 (90 Base) MCG/ACT inhaler Inhale 2 puffs into the lungs every 4 (four) hours as needed for wheezing or shortness of breath. INHALE 2 PUFFS INTO THE LUNGS EVERY 4 HOURS AS NEEDED FOR WHEEZING OR SHORTNESS OF BREATH    . senna (SENOKOT) 8.6 MG tablet Take 1 tablet by mouth daily as needed for constipation.    [name] spironolactone (ALDACTONE) 25 MG tablet Take 0.5 tablets (12.5 mg total) by mouth daily. 90 tablet 1  . SYRINGE/NEEDLE, DISP, 1 ML (B-D SYRINGE/NEEDLE 1CC/25GX5/8) 25G X 5/8" 1 ML MISC 1  Units by Does not apply route every 30 (thirty) days. 50 each 0  . vitamin A 3 MG (10000 UNITS) capsule Take 10,000 Units by mouth daily.    Alveda Reasons 20 MG TABS tablet TAKE 1 TABLET(20 MG) BY MOUTH DAILY WITH SUPPER 90 tablet 0  . zinc gluconate 50 MG tablet Take 50 mg by mouth daily.    [name] albuterol (PROVENTIL) (2.5 MG/3ML) 0.083% nebulizer solution Take 3 mLs (2.5 mg total) by nebulization every 6 (six) hours as needed for wheezing or shortness of breath. USE 1 VIAL VIA NEBULIZER EVERY 6 HOURS AS NEEDED FOR WHEEZING OR SHORTNESS OF BREATH    . bictegravir-emtricitabine-tenofovir AF (BIKTARVY) 50-200-25 MG TABS tablet Take 1 tablet by mouth daily. 30 tablet 5  . fluticasone furoate-vilanterol (BREO ELLIPTA) 200-25 MCG/INH AEPB Inhale 1 puff into the lungs daily. (Patient not taking: Reported on 04/24/2020) 1 each 5  . furosemide (LASIX) 20 MG tablet Take 1 tablet (20 mg total) by mouth every other day. (Patient not taking: Reported on 04/24/2020) 30 tablet 2  . Omega-3 Fatty Acids (FISH OIL) 1000 MG CAPS Take 1,000 mg by mouth daily as needed (pt prefrence). (Patient not taking: Reported on 04/24/2020)    . PREVIDENT 5000 SENSITIVE 1.1-5 % PSTE Use daily as directed (Patient not taking: Reported on 04/24/2020)  1  . spironolactone (ALDACTONE) 25 MG tablet Take 1 tablet (25 mg total) by mouth daily. 90 tablet 1   No facility-administered medications prior to visit.     Review of Systems:   Constitutional:   No  weight loss, night sweats,  Fevers,  +chills, fatigue, or  lassitude.  HEENT:   No headaches,  Difficulty swallowing,  Tooth/dental problems, or  Sore throat,                No sneezing, itching, ear ache, + nasal congestion, post nasal drip,   CV:  No chest pain,  Orthopnea, PND, swelling in lower extremities, anasarca, dizziness, palpitations, syncope.   GI  No heartburn, indigestion, abdominal pain,  nausea, vomiting, diarrhea, change in bowel habits, loss of appetite, bloody stools.    Resp:  .  No chest wall deformity  Skin: no rash or lesions.  GU: no dysuria, change in color of urine, no urgency or frequency.  No flank pain, no hematuria   MS:  No joint pain or swelling.  No decreased range of motion.  No back pain.    Physical Exam  BP 126/62 (BP Location: Left Arm, Cuff Size: Large)   Pulse 96   Temp 97.6 F (36.4 C) (Oral)   Ht 5' 3.5" (1.613 m)   Wt 233 lb 6.4 oz (105.9 kg)   SpO2 94%   BMI 40.70 kg/m   GEN: A/Ox3; pleasant , NAD, well nourished    HEENT:  [place]/AT,   NOSE-clear, THROAT-clear, no lesions, no postnasal drip or exudate noted.   NECK:  Supple w/ fair ROM; no JVD; normal carotid impulses w/o bruits; no thyromegaly or nodules palpated; no lymphadenopathy.    RESP  Clear  P & A; w/o, wheezes/ rales/ or rhonchi. no accessory muscle use, no dullness to percussion  CARD:  RRR, no m/r/g, no peripheral edema, pulses intact, no cyanosis or clubbing.  GI:   Soft & nt; nml bowel sounds; no organomegaly or masses detected.   Musco: Warm bil, no deformities or joint swelling noted.   Neuro: alert, no focal deficits noted.    Skin: Warm, no lesions or rashes    Lab Results:  CBC    Component Value Date/Time   WBC 8.1 02/20/2020 0834   WBC 7.8 02/16/2020 0844   RBC 4.20 02/20/2020 0834   HGB 12.8 02/20/2020 0834   HGB 12.3 02/23/2019 1100   HCT 40.1 02/20/2020 0834   HCT 36.3 02/23/2019 1100   PLT 203 02/20/2020 0834   PLT 181 02/23/2019 1100   MCV 95.5 02/20/2020 0834   MCV 89 02/23/2019 1100   MCH 30.5 02/20/2020 0834   MCHC 31.9 02/20/2020 0834   RDW 12.5 02/20/2020 0834   RDW 13.3 02/23/2019 1100   LYMPHSABS 2.1 02/20/2020 0834   LYMPHSABS 1.2 06/29/2018 0942   MONOABS 0.6 02/20/2020 0834   EOSABS 0.1 02/20/2020 0834   EOSABS 0.1 06/29/2018 0942   BASOSABS 0.0 02/20/2020 0834   BASOSABS 0.0 06/29/2018 0942    BMET    Component Value Date/Time   NA 142 02/22/2020 1122   K 4.6 02/22/2020 1122   CL 100 02/22/2020  1122   CO2 29 02/22/2020 1122   GLUCOSE 106 (H) 02/22/2020 1122   GLUCOSE 122 (H) 02/20/2020 0834   BUN 18 02/22/2020 1122   CREATININE 1.17 (H) 02/22/2020 1122   CREATININE 1.42 (H) 02/20/2020 0834   CREATININE 1.35 (H) 11/10/2019 1027   CALCIUM 10.3 02/22/2020 1122   GFRNONAA 49 (L) 02/22/2020 1122   GFRNONAA 39 (L) 02/20/2020 0834   GFRNONAA 41 (L) 11/10/2019 1027   GFRAA 57 (L) 02/22/2020 1122   GFRAA 45 (L) 02/20/2020 0834   GFRAA 48 (L) 11/10/2019 1027    BNP    Component Value Date/Time   BNP 73.6 02/15/2020 0324   BNP 26 11/03/2018 1430    ProBNP No results found for: PROBNP  Imaging: DG Chest 2 View  Result Date: 03/26/2020 CLINICAL DATA:  Dyspnea. EXAM: CHEST - 2 VIEW COMPARISON:  Chest plain film, dated February 17, 2020, and prior chest CT, dated Jan 10, 2020. FINDINGS: Mild linear atelectasis is seen within the mid left lung. A 2.9 cm x  2.2 cm ill-defined left perihilar opacity is again noted. There is no evidence of a pleural effusion or pneumothorax. The heart size and mediastinal contours are within normal limits. There is tortuosity of the descending thoracic aorta. Degenerative changes seen within the mid and lower thoracic spine. IMPRESSION: 1. Mild linear atelectasis in the mid left lung. 2. Stable 2.9 cm x 2.2 cm left perihilar opacity. This corresponds to the mass seen within this region on the prior chest CT and is consistent with the patient's known primary bronchogenic neoplasm. Electronically Signed   By: Virgina Norfolk M.D.   On: 03/26/2020 23:48      PFT Results Latest Ref Rng & Units 12/04/2015  FVC-Predicted Pre % 49  FVC-Post L 1.45  FVC-Predicted Post % 55  Pre FEV1/FVC % % 50  Post FEV1/FCV % % 55  FEV1-Pre L 0.64  FEV1-Predicted Pre % 31  FEV1-Post L 0.80  DLCO uncorrected ml/min/mmHg 16.04  DLCO UNC% % 66  DLCO corrected ml/min/mmHg 16.29  DLCO COR %Predicted % 67  DLVA Predicted % 109    No results found for:  NITRICOXIDE      Assessment & Plan:   Chronic obstructive asthma (HCC) Severe persistent asthma/chronic obstructive asthma patient has significant symptom burden. Unfortunately she has been unable to tolerate any of the maintenance inhalers including Trelegy, Symbicort, Breo.  For for now we will try albuterol nebulizer twice daily and as needed.  On return visit could consider adding and Brovana and budesonide however patient is hesitant about adding a steroid.  Patient education on asthma and current recommendations for treatment.  Plan  Patient Instructions  Finish Doxycycline.  Mucinex DM Twice daily  As needed  Cough/congestion  Saline nasal spray and gel Twice daily.  Begin Albuterol Neb Twice daily.  Albuterol Inhaler /  Neb every 4-6 hr as needed wheezing .  Activity as tolerated.  Follow up with CT chest as planned this month  Continue on Oxygen 2l/m with activity and At bedtime   Order for portable concentrator.  Follow up with Dr. Elsworth Soho  In 2 months or Domnic Vantol NP.  Please contact office for sooner follow up if symptoms do not improve or worsen or seek emergency care       Adenocarcinoma of lung [facility]) Recently diagnosed lung cancer adenocarcinoma.  Patient has finished her radiation treatments.  She has a upcoming CT chest planned for later this month.  She is to follow-up as planned.  HIV disease ([place]) Continue follow-up with infectious disease  Chronic respiratory failure with hypoxia ([place]) Patient is continue on oxygen 2 L with activity and at bedtime.  Amatory walk test in the office shows O2 saturations at 87% on room air.  At rest O2 saturations are greater than 90%.  Required oxygen at 2 L to maintain sats above 90% with ambulation.  Patient is to keep continue on her current oxygen at 2 L. Order for portable oxygen concentrator.     Rexene Edison, NP 04/24/2020

## 2020-04-24 NOTE — Patient Instructions (Addendum)
Finish Doxycycline.  Mucinex DM Twice daily  As needed  Cough/congestion  Saline nasal spray and gel Twice daily.  Begin Albuterol Neb Twice daily.  Albuterol Inhaler /  Neb every 4-6 hr as needed wheezing .  Activity as tolerated.  Follow up with CT chest as planned this month  Continue on Oxygen 2l/m with activity and At bedtime   Order for portable concentrator.  Follow up with Dr. Elsworth Soho  In 2 months or Sandar Krinke NP.  Please contact office for sooner follow up if symptoms do not improve or worsen or seek emergency care

## 2020-04-24 NOTE — Assessment & Plan Note (Signed)
Patient is continue on oxygen 2 L with activity and at bedtime.  Amatory walk test in the office shows O2 saturations at 87% on room air.  At rest O2 saturations are greater than 90%.  Required oxygen at 2 L to maintain sats above 90% with ambulation.  Patient is to keep continue on her current oxygen at 2 L. Order for portable oxygen concentrator.

## 2020-04-24 NOTE — Assessment & Plan Note (Signed)
Recently diagnosed lung cancer adenocarcinoma.  Patient has finished her radiation treatments.  She has a upcoming CT chest planned for later this month.  She is to follow-up as planned.

## 2020-04-24 NOTE — Assessment & Plan Note (Signed)
Severe persistent asthma/chronic obstructive asthma patient has significant symptom burden. Unfortunately she has been unable to tolerate any of the maintenance inhalers including Trelegy, Symbicort, Breo.  For for now we will try albuterol nebulizer twice daily and as needed.  On return visit could consider adding and Brovana and budesonide however patient is hesitant about adding a steroid.  Patient education on asthma and current recommendations for treatment.  Plan  Patient Instructions  Finish Doxycycline.  Mucinex DM Twice daily  As needed  Cough/congestion  Saline nasal spray and gel Twice daily.  Begin Albuterol Neb Twice daily.  Albuterol Inhaler /  Neb every 4-6 hr as needed wheezing .  Activity as tolerated.  Follow up with CT chest as planned this month  Continue on Oxygen 2l/m with activity and At bedtime   Order for portable concentrator.  Follow up with Dr. Elsworth Soho  In 2 months or Cristoval Teall NP.  Please contact office for sooner follow up if symptoms do not improve or worsen or seek emergency care

## 2020-04-26 ENCOUNTER — Encounter (HOSPITAL_COMMUNITY): Payer: Medicare Other

## 2020-04-26 ENCOUNTER — Ambulatory Visit: Payer: Medicare Other | Admitting: *Deleted

## 2020-04-26 DIAGNOSIS — N183 Chronic kidney disease, stage 3 unspecified: Secondary | ICD-10-CM

## 2020-04-26 DIAGNOSIS — I2510 Atherosclerotic heart disease of native coronary artery without angina pectoris: Secondary | ICD-10-CM

## 2020-04-26 DIAGNOSIS — I5032 Chronic diastolic (congestive) heart failure: Secondary | ICD-10-CM

## 2020-04-26 DIAGNOSIS — J449 Chronic obstructive pulmonary disease, unspecified: Secondary | ICD-10-CM

## 2020-04-26 DIAGNOSIS — B20 Human immunodeficiency virus [HIV] disease: Secondary | ICD-10-CM

## 2020-04-26 NOTE — Patient Instructions (Signed)
Visit Information It was nice speaking with you today. Goals Addressed              This Visit's Progress     Patient Stated   .  "I have just been diagnosed with lung cancer, I live alone in a 2 story townhouse, I have no local family and before I start treatment I am going to need help in my home and a hospital bed." (pt-stated)        Shannon [name] (see longitudinal plan of care for additional care plan information)  Current Barriers:  . Chronic Disease Management support, education, and care coordination needs related to CAD, HTN, HLD, COPD, CKD Stage 3, Anxiety, and Asthma- patient states she has finished the 5 radiation treatments for her lung cancer and will see her radiation oncologist in follow up on 05/23/20, she says she has been nauseated with severe back pain since she finished the radiation but she does not want to take anything stronger than Tylenol PM, she denies change is her baseline dyspnea, she says the hospital bed is too uncomfortable to sleep in even when she uses multiple pillows so she says she will return it once she receives her lift chair, she says she still has not heard from [employer] about the agency that will provide  her PCS, she again voiced much appreciation for the assistance of the CCM team.   Clinical Goal(s) related to CAD, HTN, HLD, COPD, CKD Stage 3, Anxiety, and Asthma:  Over the next 30 days, patient will:  . Work with the care management team to address educational, disease management, and care coordination needs  . Begin or continue self health monitoring activities as directed today  work with care management team to secure DME and home care assistance . Call provider office for new or worsened signs and symptoms CAD, HTN, HLD, COPD, CKD Stage 3, Anxiety, and Asthma . Call care management team with questions or concerns . Verbalize basic understanding of patient centered plan of care established today  Interventions related to CAD,  HTN, HLD, COPD, CKD Stage 3, Anxiety, and Asthma:   . [place] regarding status of request for services;  [name] Informed patient that [facility] is unable to accommodate due to lack of staffing.   . Informed patient that, per [place] representative, patient must contact them again to provide alternate choice for agency even though she has already provided them with two alternate choices . Provided patient with contact number for [facility] . Informed patient that CCM BSW will follow up with [facility] before the end of the week regarding status of request to newly selected agency . 04/26/20 Assessed tolerance of radiation therapy . 04/26/20 Discussed non medicinal pain management strategies . 04/26/20 Emphasized importance of staying hydrated and ensured she received Ens[employer] coupons in the mail  . 04/26/20 Reviewed upcoming appointments and ensured she has transportation   Please see past updates related to this goal by clicking on the "Past Updates" button in the selected goal         The patient verbalized understanding of instructions provided today and declined a print copy of patient instruction materials.   The care management team will reach out to the patient again over the next 30-60 days.   Kelli Churn RN, CCM, [facility] RN Care Manager [phone]

## 2020-04-26 NOTE — Chronic Care Management (AMB) (Signed)
Chronic Care Management   Follow Up Note   04/26/2020 Name: Shannon [name] MRN: [id number] DOB: 1956-05-16  Referred by: [name], MD Reason for referral : Chronic Care Management (HTN,CKD,HLD,Asthma,COPD,new dx lung Ca)   Shannon [name] is a 64 y.o. year old female who is a primary care patient of [name], MD. The CCM team was consulted for assistance with chronic disease management and care coordination needs.    Review of patient status, including review of consultants reports, relevant laboratory and other test results, and collaboration with appropriate care team members and the patient's provider was performed as part of comprehensive patient evaluation and provision of chronic care management services.    SDOH (Social Determinants of Health) assessments performed: No See Care Plan activities for detailed interventions related to [facility])     Outpatient Encounter Medications as of 04/26/2020  Medication Sig  . acetaminophen (TYLENOL) 325 MG tablet Take 325 mg by mouth every 6 (six) hours as needed (FOR PAIN).  [name] albuterol (PROVENTIL) (2.5 MG/3ML) 0.083% nebulizer solution Take 3 mLs (2.5 mg total) by nebulization every 6 (six) hours as needed for wheezing or shortness of breath.  . Ascorbic Acid (VITAMIN C) 1000 MG tablet Take 1,000 mg by mouth every other day.   . b complex vitamins tablet Take 1 tablet by mouth daily.  [name] BIKTARVY 50-200-25 MG TABS tablet TAKE 1 TABLET BY MOUTH DAILY  . BIOTIN PO Take 1 tablet by mouth daily as needed (for supplementation).   . calcium carbonate (OSCAL) 1500 (600 Ca) MG TABS tablet Take 600 mg of elemental calcium by mouth daily with breakfast.  . Cholecalciferol (SM VITAMIN D3) 100 MCG (4000 UT) CAPS Take 1 capsule (4,000 Units total) by mouth daily.  . cloNIDine (CATAPRES) 0.1 MG tablet Take 1 tablet (0.1 mg total) by mouth every 12 (twelve) hours.  . cyanocobalamin (,VITAMIN B-12,) 1000 MCG/ML injection ADMINISTER 1 ML(1000 MCG) IN  THE MUSCLE EVERY 30 DAYS  . cyanocobalamin (,VITAMIN B-12,) 1000 MCG/ML injection Inject 1 mL (1,000 mcg total) into the muscle every 30 (thirty) days. ADMINISTER 1 ML(1000 MCG) IN THE MUSCLE EVERY 30 DAYS  . diltiazem (CARDIZEM CD) 240 MG 24 hr capsule TAKE 1 CAPSULE(240 MG) BY MOUTH DAILY (Patient taking differently: Take 240 mg by mouth daily. )  . doxycycline (VIBRA-TABS) 100 MG tablet Take 1 tablet (100 mg total) by mouth 2 (two) times daily.  . fluticasone furoate-vilanterol (BREO ELLIPTA) 200-25 MCG/INH AEPB Inhale 1 puff into the lungs daily. (Patient not taking: Reported on 04/24/2020)  . furosemide (LASIX) 20 MG tablet Take 1 tablet (20 mg total) by mouth every other day. (Patient not taking: Reported on 04/24/2020)  . furosemide (LASIX) 20 MG tablet Take 1 tablet (20 mg total) by mouth daily. (Patient taking differently: Take 20 mg by mouth daily. As needed)  . MAGNESIUM PO Take 1 tablet by mouth every other day.   . Melatonin 5 MG TABS Take 5 mg by mouth at bedtime as needed (for sleep).  . Misc. Devices (PULSE OXIMETER FOR FINGER) MISC 1 Units by Does not apply route as needed.  . Mouthwashes (BIOTENE DRY MOUTH GENTLE) LIQD Use as directed 1 Dose in the mouth or throat 2 (two) times daily as needed (dry mouth).  . Omega-3 Fatty Acids (FISH OIL) 1000 MG CAPS Take 1,000 mg by mouth daily as needed (pt prefrence). (Patient not taking: Reported on 04/24/2020)  . omeprazole (PRILOSEC) 40 MG capsule Take 1 capsule (  40 mg total) by mouth daily as needed (heart burn).  . OXYGEN Inhale 1.5-2 L into the lungs See admin instructions. Uses at bedtime and through the day as needed  . polyethylene glycol (MIRALAX / GLYCOLAX) 17 g packet Take 17 g by mouth daily as needed for mild constipation.  [name] PREVIDENT 5000 SENSITIVE 1.1-5 % PSTE Use daily as directed (Patient not taking: Reported on 04/24/2020)  . PROAIR HFA 108 (90 Base) MCG/ACT inhaler Inhale 2 puffs into the lungs every 4 (four) hours as needed for  wheezing or shortness of breath. INHALE 2 PUFFS INTO THE LUNGS EVERY 4 HOURS AS NEEDED FOR WHEEZING OR SHORTNESS OF BREATH  . senna (SENOKOT) 8.6 MG tablet Take 1 tablet by mouth daily as needed for constipation.  [name] spironolactone (ALDACTONE) 25 MG tablet Take 1 tablet (25 mg total) by mouth daily.  [name] spironolactone (ALDACTONE) 25 MG tablet Take 0.5 tablets (12.5 mg total) by mouth daily.  . SYRINGE/NEEDLE, DISP, 1 ML (B-D SYRINGE/NEEDLE 1CC/25GX5/8) 25G X 5/8" 1 ML MISC 1 Units by Does not apply route every 30 (thirty) days.  . vitamin A 3 MG (10000 UNITS) capsule Take 10,000 Units by mouth daily.  Alveda Reasons 20 MG TABS tablet TAKE 1 TABLET(20 MG) BY MOUTH DAILY WITH SUPPER  . zinc gluconate 50 MG tablet Take 50 mg by mouth daily.   No facility-administered encounter medications on file as of 04/26/2020.     Objective:  Wt Readings from Last 3 Encounters:  04/24/20 233 lb 6.4 oz (105.9 kg)  04/02/20 232 lb 6.4 oz (105.4 kg)  03/26/20 231 lb 12.8 oz (105.1 kg)    BP Readings from Last 3 Encounters:  04/24/20 126/62  04/02/20 (!) 158/74  03/26/20 138/88   Lab Results  Component Value Date   CHOL 249 (H) 11/10/2019   HDL 70 11/10/2019   LDLCALC 164 (H) 11/10/2019   TRIG 53 11/10/2019   CHOLHDL 3.6 11/10/2019    Goals Addressed              This Visit's Progress     Patient Stated   .  "I have just been diagnosed with lung cancer, I live alone in a 2 story townhouse, I have no local family and before I start treatment I am going to need help in my home and a hospital bed." (pt-stated)        [place] (see longitudinal plan of care for additional care plan information)  Current Barriers:  . Chronic Disease Management support, education, and care coordination needs related to CAD, HTN, HLD, COPD, CKD Stage 3, Anxiety, and Asthma- patient states she has finished the 5 radiation treatments for her lung cancer and will see her radiation oncologist in follow up on 05/23/20,  she says she has been nauseated with severe back pain since she finished the radiation but she does not want to take anything stronger than Tylenol PM, she denies change is her baseline dyspnea, she says the hospital bed is too uncomfortable to sleep in even when she uses multiple pillows so she says she will return it once she receives her lift chair, she says she still has not heard from [employer] about the agency that will provide her PCS and understands there may be a delay due to staffing shortages, she again voiced much appreciation for the assistance of the CCM team.   Clinical Goal(s) related to CAD, HTN, HLD, COPD, CKD Stage 3, Anxiety, and Asthma:  Over  the next 30 days, patient will:  . Work with the care management team to address educational, disease management, and care coordination needs  . Begin or continue self health monitoring activities as directed today  work with care management team to secure DME and home care assistance . Call provider office for new or worsened signs and symptoms CAD, HTN, HLD, COPD, CKD Stage 3, Anxiety, and Asthma . Call care management team with questions or concerns . Verbalize basic understanding of patient centered plan of care established today  Interventions related to CAD, HTN, HLD, COPD, CKD Stage 3, Anxiety, and Asthma:   . [place] regarding status of request for services;  [name] Informed patient that [facility] is unable to accommodate due to lack of staffing.   . Informed patient that, per [facility] representative, patient must contact them again to provide alternate choice for agency even though she has already provided them with two alternate choices . Provided patient with contact number for [facility] . Informed patient that CCM BSW will follow up with [facility] before the end of the week regarding status of request to newly selected agency . 04/26/20 Assessed tolerance of radiation therapy . 04/26/20 Discussed non medicinal pain management  strategies . 04/26/20 Emphasized importance of staying hydrated and verified she received Ensure coupons in the mail and purchased the supplement . 04/26/20 Reviewed upcoming appointments and ensured she has transportation   Please see past updates related to this goal by clicking on the "Past Updates" button in the selected goal          Plan:   The care management team will reach out to the patient again over the next 30-60 days.    Kelli Churn RN, CCM, [facility] RN Care Manager [phone]

## 2020-04-30 ENCOUNTER — Telehealth: Payer: Medicare Other

## 2020-05-03 ENCOUNTER — Ambulatory Visit: Payer: Medicare Other

## 2020-05-03 DIAGNOSIS — N183 Chronic kidney disease, stage 3 unspecified: Secondary | ICD-10-CM

## 2020-05-03 DIAGNOSIS — I10 Essential (primary) hypertension: Secondary | ICD-10-CM

## 2020-05-03 DIAGNOSIS — I5032 Chronic diastolic (congestive) heart failure: Secondary | ICD-10-CM

## 2020-05-03 NOTE — Patient Instructions (Signed)
Visit Information  Goals Addressed              This Visit's Progress     "I have just been diagnosed with lung cancer, I live alone in a 2 story townhouse, I have no local family and before I start treatment I am going to need help in my home and a hospital bed." (pt-stated)        [place] (see longitudinal plan of care for additional care plan information)  Current Barriers:   Chronic Disease Management support, education, and care coordination needs related to CAD, HTN, HLD, COPD, CKD Stage 3, Anxiety, and Asthma- patient states she has finished the 5 radiation treatments for her lung cancer and will see her radiation oncologist in follow up on 05/23/20, she says she has been nauseated with severe back pain since she finished the radiation but she does not want to take anything stronger than Tylenol PM, she denies change is her baseline dyspnea, she says the hospital bed is too uncomfortable to sleep in even when she uses multiple pillows so she says she will return it once she receives her lift chair, she says she still has not heard from [employer] about the agency that will provide  her PCS and understands there may be a delay due to staffing shortages, she again voiced much appreciation for the assistance of the CCM team.   Clinical Goal(s) related to CAD, HTN, HLD, COPD, CKD Stage 3, Anxiety, and Asthma:  Over the next 30 days, patient will:   Work with the care management team to address educational, disease management, and care coordination needs   Begin or continue self health monitoring activities as directed today  work with care management team to secure DME and home care assistance  Call provider office for new or worsened signs and symptoms CAD, HTN, HLD, COPD, CKD Stage 3, Anxiety, and Asthma  Call care management team with questions or concerns  Verbalize basic understanding of patient centered plan of care established today  Interventions related to  CAD, HTN, HLD, COPD, CKD Stage 3, Anxiety, and Asthma:    Inquired if patient is still seeking PCS   Informed patient that, if chosen agency cannot accommodate request for PCS, [employer] will mail a letter indicating this and requesting new agency to be selected  Encouraged patient to contact [employer] regarding current status of services  Informed patient that she can call [facility] anytime to check status and that she does not have to wait for letters  Please see past updates related to this goal by clicking on the "Past Updates" button in the selected goal        "I need a lift chair" (pt-stated)        [place] (see longitudinal plan of care for additional care plan information)  Current Barriers:   Patient seeking lift chair.  States that she has contacted [employer] and was told that chair is covered.    Clinical Social Work Clinical Goal(s):   Over the next 90 days, patient will work with SW to address concerns related to need for DME/lift chair  Interventions:  Community message sent to [employer] team regarding status of order for lift chair  Patient Self Care Activities:   Self administers medications as prescribed  Calls provider office for new concerns or questions  Unable to perform IADLs independently  IPlease see past updates related to this goal by clicking on the "Past  Updates" button in the selected goal         Patient verbalizes understanding of instructions provided today.   Will follow up with patient when response is received from [employer] about status of order for lift chair.

## 2020-05-03 NOTE — Chronic Care Management (AMB) (Signed)
Care Management   Follow Up Note   05/03/2020 Name: Shannon [name] MRN: [id number] DOB: 1956/01/03  Referred by: [name], MD Reason for referral : Care Coordination (DME, PCS)   Shannon [name] is a 64 y.o. year old female who is a primary care patient of [name], MD. The care management team was consulted for assistance with care management and care coordination needs.    Review of patient status, including review of consultants reports, relevant laboratory and other test results, and collaboration with appropriate care team members and the patient's provider was performed as part of comprehensive patient evaluation and provision of chronic care management services.    SDOH (Social Determinants of Health) assessments performed: No See Care Plan activities for detailed interventions related to [facility])     Advanced Directives: See Care Plan and Vynca application for related entries.   Goals Addressed              This Visit's Progress   .  "I have just been diagnosed with lung cancer, I live alone in a 2 story townhouse, I have no local family and before I start treatment I am going to need help in my home and a hospital bed." (pt-stated)        [place] (see longitudinal plan of care for additional care plan information)  Current Barriers:  . Chronic Disease Management support, education, and care coordination needs related to CAD, HTN, HLD, COPD, CKD Stage 3, Anxiety, and Asthma- patient states she has finished the 5 radiation treatments for her lung cancer and will see her radiation oncologist in follow up on 05/23/20, she says she has been nauseated with severe back pain since she finished the radiation but she does not want to take anything stronger than Tylenol PM, she denies change is her baseline dyspnea, she says the hospital bed is too uncomfortable to sleep in even when she uses multiple pillows so she says she will return it once she receives her lift  chair, she says she still has not heard from [employer] about the agency that will provide  her PCS and understands there may be a delay due to staffing shortages, she again voiced much appreciation for the assistance of the CCM team.   Clinical Goal(s) related to CAD, HTN, HLD, COPD, CKD Stage 3, Anxiety, and Asthma:  Over the next 30 days, patient will:  . Work with the care management team to address educational, disease management, and care coordination needs  . Begin or continue self health monitoring activities as directed today  work with care management team to secure DME and home care assistance . Call provider office for new or worsened signs and symptoms CAD, HTN, HLD, COPD, CKD Stage 3, Anxiety, and Asthma . Call care management team with questions or concerns . Verbalize basic understanding of patient centered plan of care established today  Interventions related to CAD, HTN, HLD, COPD, CKD Stage 3, Anxiety, and Asthma:   . Inquired if patient is still seeking PCS .  Informed patient that, if chosen agency cannot accommodate request for PCS, [employer] will mail a letter indicating this and requesting new agency to be selected . Encouraged patient to contact [employer] regarding current status of services . Informed patient that she can call [facility] anytime to check status and that she does not have to wait for letters  Please see past updates related to this goal by clicking on the "Past  Updates" button in the selected goal      .  "I need a lift chair" (pt-stated)        [place] (see longitudinal plan of care for additional care plan information)  Current Barriers:  . Patient seeking lift chair.  States that she has contacted [employer] and was told that chair is covered.    Clinical Social Work Clinical Goal(s):  [name] Over the next 90 days, patient will work with SW to address concerns related to need for DME/lift  chair  Interventions: . Community message sent to [facility] team regarding status of order for lift chair  Patient Self Care Activities:  . Self administers medications as prescribed . Calls provider office for new concerns or questions . Unable to perform IADLs independently  IPlease see past updates related to this goal by clicking on the "Past Updates" button in the selected goal         Will follow up with patient when response is received from [employer] about order for lift chair.     Ronn Melena, [place] Coordination Social Worker [place] [phone]

## 2020-05-07 ENCOUNTER — Ambulatory Visit: Payer: Medicare Other

## 2020-05-07 DIAGNOSIS — N183 Chronic kidney disease, stage 3 unspecified: Secondary | ICD-10-CM

## 2020-05-07 DIAGNOSIS — B20 Human immunodeficiency virus [HIV] disease: Secondary | ICD-10-CM

## 2020-05-07 DIAGNOSIS — I5032 Chronic diastolic (congestive) heart failure: Secondary | ICD-10-CM

## 2020-05-07 NOTE — Chronic Care Management (AMB) (Signed)
  Care Management   Follow Up Note   05/07/2020 Name: Shannon [name]: [id number] DOB: 31-Aug-1955  Referred by: Shannon Falcon, MD Reason for referral : Care Coordination (Lift chair)   Shannon [name] is a 64 y.o. year old female who is a primary care patient of Shannon Falcon, MD. The care management team was consulted for assistance with care management and care coordination needs.    Review of patient status, including review of consultants reports, relevant laboratory and other test results, and collaboration with appropriate care team members and the patient's provider was performed as part of comprehensive patient evaluation and provision of chronic care management services.    SDOH (Social Determinants of Health) assessments performed: No See Care Plan activities for detailed interventions related to [facility])     Advanced Directives: See Care Plan and Vynca application for related entries.   Goals Addressed              This Visit's Progress   .  "I need a lift chair" (pt-stated)        CARE PLAN ENTRY (see longitudinal plan of care for additional care plan information)  Current Barriers:  . Patient seeking lift chair.  States that she has contacted [employer] and was told that chair is covered.    Clinical Social Work Clinical Goal(s):  Shannon [name] Kitchen Over the next 90 days, patient will work with SW to address concerns related to need for DME/lift chair  Interventions: . Received message from Shannon [name] with [place] that inquiries about status of lift chair should be directed to retail department; [phone] . Left messages for Shannon [name] (ext [phone]) and Shannon [name] (ext [phone]) in retail department as directed by Shannon [name]  Patient Self Care Activities:  . Self administers medications as prescribed . Calls provider office for new concerns or questions . Unable to perform IADLs independently  IPlease see past updates related to this goal by clicking on the "Past  Updates" button in the selected goal          The patient has been provided with contact information for the care management team and has been advised to call with any health related questions or concerns.      Shannon [name], [place] Coordination Social Worker [place] [phone]

## 2020-05-07 NOTE — Progress Notes (Signed)
Internal Medicine Clinic Resident  I have personally reviewed this encounter including the documentation in this note and/or discussed this patient with the care management provider. I will address any urgent items identified by the care management provider and will communicate my actions to the patient's PCP. I have reviewed the patient's CCM visit with my supervising attending, Dr Daryll Drown.  Foy [place], MD 05/07/2020

## 2020-05-07 NOTE — Patient Instructions (Signed)
Visit Information  Goals Addressed              This Visit's Progress     "I need a lift chair" (pt-stated)        CARE PLAN ENTRY (see longitudinal plan of care for additional care plan information)  Current Barriers:   Patient seeking lift chair.  States that she has contacted [employer] and was told that chair is covered.    Clinical Social Work Clinical Goal(s):   Over the next 90 days, patient will work with SW to address concerns related to need for DME/lift chair  Interventions:  Received message from Andria Rhein with [employer] that inquiries about status of lift chair should be directed to retail department; [phone]  Left messages for [place] (ext [phone]) and Elberta Fortis (ext [phone]) in retail department as directed by Ms. Albertina Parr  Patient Self Care Activities:   Self administers medications as prescribed  Calls provider office for new concerns or questions  Unable to perform IADLs independently  IPlease see past updates related to this goal by clicking on the "Past Updates" button in the selected goal           The patient has been provided with contact information for the care management team and has been advised to call with any health related questions or concerns.    Ronn Melena, [place] Coordination Social Worker [place] [phone]

## 2020-05-08 NOTE — Progress Notes (Signed)
Internal Medicine Clinic Attending  CCM services provided by the care management provider and their documentation were discussed with Dr. Wynetta Emery. We reviewed the pertinent findings, urgent action items addressed by the resident and non-urgent items to be addressed by the PCP.  I agree with the assessment, diagnosis, and plan of care documented in the CCM and resident's note.  Gilles Chiquito, MD 05/08/2020

## 2020-05-08 NOTE — Progress Notes (Incomplete)
°  Patient Name: Shannon [name] MRN: [id number] DOB: 25-Apr-1956 Referring Physician: Gilles Chiquito (Profile Not Attached) Date of Service: 04/22/2020 [facility], [place]                                                        End Of Treatment Note  Diagnoses: C34.12-Malignant neoplasm of upper lobe, left bronchus or lung  Cancer Staging: Stage IA (T1c, N0, M0) non-small cell lung cancer of the left upper lobe, adenocarcinoma  Intent: Curative  Radiation Treatment Dates: 04/09/2020 through 04/22/2020 Site Technique Total Dose (Gy) Dose per Fx (Gy) Completed Fx Beam Energies  Lung, Left: Lung_Lt IMRT 60/60 12 5/5 6XFFF   Narrative: The patient tolerated radiation therapy relatively well. She did report some left-sided mid back pain as well as an occasional productive cough with phlegm. Her breathing improved throughout treatment.  Plan: The patient will follow-up with radiation oncology in one month.  ________________________________________________   Blair Promise, PhD, MD  This document serves as a record of services personally performed by Gery Pray, MD. It was created on his behalf by Clerance Lav, a trained medical scribe. The creation of this record is based on the scribe's personal observations and the provider's statements to them. This document has been checked and approved by the attending provider.

## 2020-05-10 ENCOUNTER — Ambulatory Visit: Payer: Medicare Other

## 2020-05-10 DIAGNOSIS — I5032 Chronic diastolic (congestive) heart failure: Secondary | ICD-10-CM

## 2020-05-10 DIAGNOSIS — N183 Chronic kidney disease, stage 3 unspecified: Secondary | ICD-10-CM

## 2020-05-10 DIAGNOSIS — B20 Human immunodeficiency virus [HIV] disease: Secondary | ICD-10-CM

## 2020-05-10 NOTE — Patient Instructions (Signed)
Visit Information  Goals Addressed              This Visit's Progress   .  "I have just been diagnosed with lung cancer, I live alone in a 2 story townhouse, I have no local family and before I start treatment I am going to need help in my home and a hospital bed." (pt-stated)        [place] (see longitudinal plan of care for additional care plan information)  Current Barriers:  . Chronic Disease Management support, education, and care coordination needs related to CAD, HTN, HLD, COPD, CKD Stage 3, Anxiety, and Asthma- patient states she has finished the 5 radiation treatments for her lung cancer and will see her radiation oncologist in follow up on 05/23/20, she says she has been nauseated with severe back pain since she finished the radiation but she does not want to take anything stronger than Tylenol PM, she denies change is her baseline dyspnea, she says the hospital bed is too uncomfortable to sleep in even when she uses multiple pillows so she says she will return it once she receives her lift chair, she says she still has not heard from [employer] about the agency that will provide  her PCS and understands there may be a delay due to staffing shortages, she again voiced much appreciation for the assistance of the CCM team.   Clinical Goal(s) related to CAD, HTN, HLD, COPD, CKD Stage 3, Anxiety, and Asthma:  Over the next 30 days, patient will:  . Work with the care management team to address educational, disease management, and care coordination needs  . Begin or continue self health monitoring activities as directed today  work with care management team to secure DME and home care assistance . Call provider office for new or worsened signs and symptoms CAD, HTN, HLD, COPD, CKD Stage 3, Anxiety, and Asthma . Call care management team with questions or concerns . Verbalize basic understanding of patient centered plan of care established today  Interventions related to  CAD, HTN, HLD, COPD, CKD Stage 3, Anxiety, and Asthma:   . [place] to determine timeframe that patient has to select new PCS agency due to [facility] not being able to accommodate request . Called patient and informed her that she has until 05/19/20 to select new agency or a new assessment will have to be completed . Informed patient that [place] be able to accommodate request if she is comfortable with selecting this agency . Messaged [facility] team to inform them of patient's request for hospital bed to be picked up as she no longer intends to use it  Please see past updates related to this goal by clicking on the "Past Updates" button in the selected goal      .  "I need a lift chair" (pt-stated)        [place] (see longitudinal plan of care for additional care plan information)  Current Barriers:  . Patient seeking lift chair.  States that she has contacted [employer] and was told that chair is covered.    Clinical Social Work Clinical Goal(s):  [name] Over the next 90 days, patient will work with SW to address concerns related to need for DME/lift chair  Interventions: . Provided patient with number for [employer] [place] [phone]) so that she may check status of lift chair  Patient Self Care Activities:  . Self administers medications as prescribed .  Calls provider office for new concerns or questions . Unable to perform IADLs independently  IPlease see past updates related to this goal by clicking on the "Past Updates" button in the selected goal         Patient verbalizes understanding of instructions provided today.   The patient has been provided with contact information for the care management team and has been advised to call with any health related questions or concerns.       Ronn Melena, [place] Coordination Social Worker [place] [phone]

## 2020-05-10 NOTE — Chronic Care Management (AMB) (Signed)
Care Management   Follow Up Note   05/10/2020 Name: Shannon [name] MRN: [id number] DOB: [birth year]  Referred by: [name], MD Reason for referral : Care Coordination (PCS, DME)   Shannon [name] is a 64 y.o. year old female who is a primary care patient of [name], MD. The care management team was consulted for assistance with care management and care coordination needs.    Review of patient status, including review of consultants reports, relevant laboratory and other test results, and collaboration with appropriate care team members and the patient's provider was performed as part of comprehensive patient evaluation and provision of chronic care management services.    SDOH (Social Determinants of Health) assessments performed: No See Care Plan activities for detailed interventions related to [facility])     Advanced Directives: See Care Plan and Vynca application for related entries.   Goals Addressed              This Visit's Progress   .  "I have just been diagnosed with lung cancer, I live alone in a 2 story townhouse, I have no local family and before I start treatment I am going to need help in my home and a hospital bed." (pt-stated)        [place] (see longitudinal plan of care for additional care plan information)  Current Barriers:  . Chronic Disease Management support, education, and care coordination needs related to CAD, HTN, HLD, COPD, CKD Stage 3, Anxiety, and Asthma- patient states she has finished the 5 radiation treatments for her lung cancer and will see her radiation oncologist in follow up on 05/23/20, she says she has been nauseated with severe back pain since she finished the radiation but she does not want to take anything stronger than Tylenol PM, she denies change is her baseline dyspnea, she says the hospital bed is too uncomfortable to sleep in even when she uses multiple pillows so she says she will return it once she receives her lift  chair, she says she still has not heard from [employer] about the agency that will provide  her PCS and understands there may be a delay due to staffing shortages, she again voiced much appreciation for the assistance of the CCM team.   Clinical Goal(s) related to CAD, HTN, HLD, COPD, CKD Stage 3, Anxiety, and Asthma:  Over the next 30 days, patient will:  . Work with the care management team to address educational, disease management, and care coordination needs  . Begin or continue self health monitoring activities as directed today  work with care management team to secure DME and home care assistance . Call provider office for new or worsened signs and symptoms CAD, HTN, HLD, COPD, CKD Stage 3, Anxiety, and Asthma . Call care management team with questions or concerns . Verbalize basic understanding of patient centered plan of care established today  Interventions related to CAD, HTN, HLD, COPD, CKD Stage 3, Anxiety, and Asthma:   . [place] to determine timeframe that patient has to select new PCS agency due to [facility] not being able to accommodate request . Called patient and informed her that she has until 05/19/20 to select new agency or a new assessment will have to be completed . Informed patient that [place] be able to accommodate request if she is comfortable with selecting this agency . Messaged [facility] team to inform them of patient's request for hospital bed to be picked  up as she no longer intends to use it  Please see past updates related to this goal by clicking on the "Past Updates" button in the selected goal      .  "I need a lift chair" (pt-stated)        [place] (see longitudinal plan of care for additional care plan information)  Current Barriers:  . Patient seeking lift chair.  States that she has contacted [employer] and was told that chair is covered.    Clinical Social Work Clinical Goal(s):  [name] Over the next 90  days, patient will work with SW to address concerns related to need for DME/lift chair  Interventions: . Provided patient with number for [employer] [place] [phone]) so that she may check status of lift chair  Patient Self Care Activities:  . Self administers medications as prescribed . Calls provider office for new concerns or questions . Unable to perform IADLs independently  IPlease see past updates related to this goal by clicking on the "Past Updates" button in the selected goal          The patient has been provided with contact information for the care management team and has been advised to call with any health related questions or concerns.     Ronn Melena, [place] Coordination Social Worker [place] [phone]

## 2020-05-10 NOTE — Progress Notes (Signed)
Internal Medicine Clinic Resident  I have personally reviewed this encounter including the documentation in this note and/or discussed this patient with the care management provider. I will address any urgent items identified by the care management provider and will communicate my actions to the patient's PCP. I have reviewed the patient's CCM visit with my supervising attending, Dr Heber [place].  Foy [place], MD 05/10/2020

## 2020-05-13 DIAGNOSIS — J45909 Unspecified asthma, uncomplicated: Secondary | ICD-10-CM | POA: Diagnosis not present

## 2020-05-13 DIAGNOSIS — R269 Unspecified abnormalities of gait and mobility: Secondary | ICD-10-CM | POA: Diagnosis not present

## 2020-05-13 DIAGNOSIS — G4733 Obstructive sleep apnea (adult) (pediatric): Secondary | ICD-10-CM | POA: Diagnosis not present

## 2020-05-13 DIAGNOSIS — J449 Chronic obstructive pulmonary disease, unspecified: Secondary | ICD-10-CM | POA: Diagnosis not present

## 2020-05-14 ENCOUNTER — Other Ambulatory Visit: Payer: Self-pay

## 2020-05-14 ENCOUNTER — Other Ambulatory Visit: Payer: Self-pay | Admitting: Internal Medicine

## 2020-05-14 DIAGNOSIS — B20 Human immunodeficiency virus [HIV] disease: Secondary | ICD-10-CM

## 2020-05-14 DIAGNOSIS — C3492 Malignant neoplasm of unspecified part of left bronchus or lung: Secondary | ICD-10-CM

## 2020-05-14 DIAGNOSIS — Z113 Encounter for screening for infections with a predominantly sexual mode of transmission: Secondary | ICD-10-CM

## 2020-05-14 NOTE — Progress Notes (Signed)
Discontinuing hospital bed.

## 2020-05-14 NOTE — Progress Notes (Signed)
Skeet Latch at [facility] is aware that order has been placed to d/c hospital bed. The bed can now be removed from the patient's home. Hubbard Hartshorn, BSN, RN-BC

## 2020-05-15 ENCOUNTER — Other Ambulatory Visit: Payer: Medicare Other

## 2020-05-15 ENCOUNTER — Telehealth: Payer: Medicare Other

## 2020-05-16 ENCOUNTER — Telehealth: Payer: Medicare Other

## 2020-05-17 ENCOUNTER — Ambulatory Visit: Payer: Medicare Other

## 2020-05-17 DIAGNOSIS — B20 Human immunodeficiency virus [HIV] disease: Secondary | ICD-10-CM

## 2020-05-17 DIAGNOSIS — I5032 Chronic diastolic (congestive) heart failure: Secondary | ICD-10-CM

## 2020-05-17 DIAGNOSIS — N183 Chronic kidney disease, stage 3 unspecified: Secondary | ICD-10-CM

## 2020-05-17 NOTE — Patient Instructions (Signed)
Visit Information  Goals Addressed              This Visit's Progress   .  "I have just been diagnosed with lung cancer, I live alone in a 2 story townhouse, I have no local family and before I start treatment I am going to need help in my home and a hospital bed." (pt-stated)        [place] (see longitudinal plan of care for additional care plan information)  Current Barriers:  . Chronic Disease Management support, education, and care coordination needs related to CAD, HTN, HLD, COPD, CKD Stage 3, Anxiety, and Asthma- patient states she has finished the 5 radiation treatments for her lung cancer and will see her radiation oncologist in follow up on 05/23/20, she says she has been nauseated with severe back pain since she finished the radiation but she does not want to take anything stronger than Tylenol PM, she denies change is her baseline dyspnea, she says the hospital bed is too uncomfortable to sleep in even when she uses multiple pillows so she says she will return it once she receives her lift chair, she says she still has not heard from [employer] about the agency that will provide  her PCS and understands there may be a delay due to staffing shortages, she again voiced much appreciation for the assistance of the CCM team.   Clinical Goal(s) related to CAD, HTN, HLD, COPD, CKD Stage 3, Anxiety, and Asthma:  Over the next 30 days, patient will:  . Work with the care management team to address educational, disease management, and care coordination needs  . Begin or continue self health monitoring activities as directed today  work with care management team to secure DME and home care assistance . Call provider office for new or worsened signs and symptoms CAD, HTN, HLD, COPD, CKD Stage 3, Anxiety, and Asthma . Call care management team with questions or concerns . Verbalize basic understanding of patient centered plan of care established today  Interventions related to  CAD, HTN, HLD, COPD, CKD Stage 3, Anxiety, and Asthma:   . Inquired if patient contacted [employer] to request new PCS agency; patient no longer wishes to pursue PCS . Inquired if hospital bed has been picked up; patient states it is supposed to be picked up today   .  .  .  [name] Please see past updates related to this goal by clicking on the "Past Updates" button in the selected goal         Patient verbalizes understanding of instructions provided today.   The patient has been provided with contact information for the care management team and has been advised to call with any health related questions or concerns.      Ronn Melena, [place] Coordination Social Worker [place] [phone]

## 2020-05-17 NOTE — Chronic Care Management (AMB) (Signed)
Care Management   Follow Up Note   05/17/2020 Name: Shannon [name] MRN: [id number] DOB: 1956/01/17  Referred by: [name], MD Reason for referral : Care Coordination (DME, PCS)   Shannon [name] is a 64 y.o. year old female who is a primary care patient of [name], MD. The care management team was consulted for assistance with care management and care coordination needs.    Review of patient status, including review of consultants reports, relevant laboratory and other test results, and collaboration with appropriate care team members and the patient's provider was performed as part of comprehensive patient evaluation and provision of chronic care management services.    SDOH (Social Determinants of Health) assessments performed: No See Care Plan activities for detailed interventions related to [facility])     Advanced Directives: See Care Plan and Vynca application for related entries.   Goals Addressed              This Visit's Progress   .  "I have just been diagnosed with lung cancer, I live alone in a 2 story townhouse, I have no local family and before I start treatment I am going to need help in my home and a hospital bed." (pt-stated)        [place] (see longitudinal plan of care for additional care plan information)  Current Barriers:  . Chronic Disease Management support, education, and care coordination needs related to CAD, HTN, HLD, COPD, CKD Stage 3, Anxiety, and Asthma- patient states she has finished the 5 radiation treatments for her lung cancer and will see her radiation oncologist in follow up on 05/23/20, she says she has been nauseated with severe back pain since she finished the radiation but she does not want to take anything stronger than Tylenol PM, she denies change is her baseline dyspnea, she says the hospital bed is too uncomfortable to sleep in even when she uses multiple pillows so she says she will return it once she receives her lift  chair, she says she still has not heard from [employer] about the agency that will provide  her PCS and understands there may be a delay due to staffing shortages, she again voiced much appreciation for the assistance of the CCM team.   Clinical Goal(s) related to CAD, HTN, HLD, COPD, CKD Stage 3, Anxiety, and Asthma:  Over the next 30 days, patient will:  . Work with the care management team to address educational, disease management, and care coordination needs  . Begin or continue self health monitoring activities as directed today  work with care management team to secure DME and home care assistance . Call provider office for new or worsened signs and symptoms CAD, HTN, HLD, COPD, CKD Stage 3, Anxiety, and Asthma . Call care management team with questions or concerns . Verbalize basic understanding of patient centered plan of care established today  Interventions related to CAD, HTN, HLD, COPD, CKD Stage 3, Anxiety, and Asthma:   . Inquired if patient contacted [employer] to request new PCS agency; patient no longer wishes to pursue PCS . Inquired if hospital bed has been picked up; patient states it is supposed to be picked up today   .  .  .  [name] Please see past updates related to this goal by clicking on the "Past Updates" button in the selected goal          The patient has been provided with contact information for  the care management team and has been advised to call with any health related questions or concerns.    Ronn Melena, [name] Coordination Social Worker [place] [phone]

## 2020-05-20 ENCOUNTER — Ambulatory Visit: Payer: Medicare Other

## 2020-05-20 DIAGNOSIS — I10 Essential (primary) hypertension: Secondary | ICD-10-CM

## 2020-05-20 DIAGNOSIS — N183 Chronic kidney disease, stage 3 unspecified: Secondary | ICD-10-CM

## 2020-05-20 DIAGNOSIS — I5032 Chronic diastolic (congestive) heart failure: Secondary | ICD-10-CM

## 2020-05-20 NOTE — Patient Instructions (Signed)
Visit Information  Goals Addressed              This Visit's Progress   .  "I have just been diagnosed with lung cancer, I live alone in a 2 story townhouse, I have no local family and before I start treatment I am going to need help in my home and a hospital bed." (pt-stated)        [place] (see longitudinal plan of care for additional care plan information)  Current Barriers:  . Chronic Disease Management support, education, and care coordination needs related to CAD, HTN, HLD, COPD, CKD Stage 3, Anxiety, and Asthma- patient states she has finished the 5 radiation treatments for her lung cancer and will see her radiation oncologist in follow up on 05/23/20, she says she has been nauseated with severe back pain since she finished the radiation but she does not want to take anything stronger than Tylenol PM, she denies change is her baseline dyspnea, she says the hospital bed is too uncomfortable to sleep in even when she uses multiple pillows so she says she will return it once she receives her lift chair, she says she still has not heard from [employer] about the agency that will provide  her PCS and understands there may be a delay due to staffing shortages, she again voiced much appreciation for the assistance of the CCM team.   Clinical Goal(s) related to CAD, HTN, HLD, COPD, CKD Stage 3, Anxiety, and Asthma:  Over the next 30 days, patient will:  . Work with the care management team to address educational, disease management, and care coordination needs  . Begin or continue self health monitoring activities as directed today  work with care management team to secure DME and home care assistance . Call provider office for new or worsened signs and symptoms CAD, HTN, HLD, COPD, CKD Stage 3, Anxiety, and Asthma . Call care management team with questions or concerns . Verbalize basic understanding of patient centered plan of care established today  Interventions related to  CAD, HTN, HLD, COPD, CKD Stage 3, Anxiety, and Asthma:    . Returned all to patient in response to message received that hospital bed has not been picked up by [employer].  Patient had just gotten off the phone with [employer] representative at time of call and said that someone is supposed to be calling her back to schedule pick up .  .  . Please see past updates related to this goal by clicking on the "Past Updates" button in the selected goal      .  "I need a lift chair" (pt-stated)        [place] (see longitudinal plan of care for additional care plan information)  Current Barriers:  . Patient seeking lift chair.  States that she has contacted [employer] and was told that chair is covered.    Clinical Social Work Clinical Goal(s):  [name] Over the next 90 days, patient will work with SW to address concerns related to need for DME/lift chair  Interventions: . Returned call to patient in response to voicemail message received requesting assistance with DME.  Patient had just gotten off the phone with [employer] representative at time of call and stated that fitting for chair has been scheduled for 06/06/20  Patient Self Care Activities:  . Self administers medications as prescribed . Calls provider office for new concerns or questions . Unable to perform IADLs independently  IPlease see  past updates related to this goal by clicking on the "Past Updates" button in the selected goal         Patient verbalizes understanding of instructions provided today.   The patient has been provided with contact information for the care management team and has been advised to call with any health related questions or concerns.     Ronn Melena, [place] Coordination Social Worker [place] [phone]

## 2020-05-20 NOTE — Chronic Care Management (AMB) (Signed)
Care Management   Follow Up Note   05/20/2020 Name: Shannon [name] MRN: [id number] DOB: 07-27-1956  Referred by: [name], MD Reason for referral : Care Coordination (DME)   HILMA STEINHILBER is a 64 y.o. year old female who is a primary care patient of [name], MD. The care management team was consulted for assistance with care management and care coordination needs.    Review of patient status, including review of consultants reports, relevant laboratory and other test results, and collaboration with appropriate care team members and the patient's provider was performed as part of comprehensive patient evaluation and provision of chronic care management services.    SDOH (Social Determinants of Health) assessments performed: No See Care Plan activities for detailed interventions related to [facility])     Advanced Directives: See Care Plan and Vynca application for related entries.   Goals Addressed              This Visit's Progress   .  "I have just been diagnosed with lung cancer, I live alone in a 2 story townhouse, I have no local family and before I start treatment I am going to need help in my home and a hospital bed." (pt-stated)        [place] (see longitudinal plan of care for additional care plan information)  Current Barriers:  . Chronic Disease Management support, education, and care coordination needs related to CAD, HTN, HLD, COPD, CKD Stage 3, Anxiety, and Asthma- patient states she has finished the 5 radiation treatments for her lung cancer and will see her radiation oncologist in follow up on 05/23/20, she says she has been nauseated with severe back pain since she finished the radiation but she does not want to take anything stronger than Tylenol PM, she denies change is her baseline dyspnea, she says the hospital bed is too uncomfortable to sleep in even when she uses multiple pillows so she says she will return it once she receives her lift chair,  she says she still has not heard from [employer] about the agency that will provide  her PCS and understands there may be a delay due to staffing shortages, she again voiced much appreciation for the assistance of the CCM team.   Clinical Goal(s) related to CAD, HTN, HLD, COPD, CKD Stage 3, Anxiety, and Asthma:  Over the next 30 days, patient will:  . Work with the care management team to address educational, disease management, and care coordination needs  . Begin or continue self health monitoring activities as directed today  work with care management team to secure DME and home care assistance . Call provider office for new or worsened signs and symptoms CAD, HTN, HLD, COPD, CKD Stage 3, Anxiety, and Asthma . Call care management team with questions or concerns . Verbalize basic understanding of patient centered plan of care established today  Interventions related to CAD, HTN, HLD, COPD, CKD Stage 3, Anxiety, and Asthma:    . Returned all to patient in response to message received that hospital bed has not been picked up by [facility].  Patient had just gotten off the phone with [employer] representative at time of call and said that someone is supposed to be calling her back to schedule pick up .  .  . Please see past updates related to this goal by clicking on the "Past Updates" button in the selected goal      .  "I need a  lift chair" (pt-stated)        [facility] (see longitudinal plan of care for additional care plan information)  Current Barriers:  . Patient seeking lift chair.  States that she has contacted [employer] and was told that chair is covered.    Clinical Social Work Clinical Goal(s):  [name] Over the next 90 days, patient will work with SW to address concerns related to need for DME/lift chair  Interventions: . Returned call to patient in response to voicemail message received requesting assistance with DME.  Patient had just gotten off the phone with [employer]  representative at time of call and stated that fitting for chair has been scheduled for 06/06/20  Patient Self Care Activities:  . Self administers medications as prescribed . Calls provider office for new concerns or questions . Unable to perform IADLs independently  IPlease see past updates related to this goal by clicking on the "Past Updates" button in the selected goal          The patient has been provided with contact information for the care management team and has been advised to call with any health related questions or concerns.    Ronn Melena, [place] Coordination Social Worker [place] [phone]

## 2020-05-20 NOTE — Progress Notes (Addendum)
Internal Medicine Clinic Resident  I have personally reviewed this encounter including the documentation in this note and/or discussed this patient with the care management provider. I will address any urgent items identified by the care management provider and will communicate my actions to the patient's PCP. I have reviewed the patient's CCM visit with my supervising attending, Dr Gwyndolyn Saxon.  Jean Rosenthal, MD 05/20/2020    Internal Medicine Attending: I reviewed case and documentation and agree.

## 2020-05-21 ENCOUNTER — Other Ambulatory Visit: Payer: Medicare Other

## 2020-05-21 ENCOUNTER — Telehealth: Payer: Self-pay

## 2020-05-21 NOTE — Telephone Encounter (Signed)
Patient is requesting to have a Lipid panel drawn. Please advise.  Shannon [name] Shannon [name]

## 2020-05-22 ENCOUNTER — Telehealth: Payer: Medicare Other

## 2020-05-22 NOTE — Progress Notes (Signed)
Radiation Oncology         [phone] ________________________________  Name: Shannon [name] MRN: [id number]  Date: 05/23/2020  DOB: January 29, 1956  Follow-Up Visit Note  CC: [name], MD  [name], MD    ICD-10-CM   1. Adenocarcinoma of lung, unspecified laterality ([place])  C34.90 CT Chest Wo Contrast  2. Nodule of upper lobe of left lung  R91.1     Diagnosis: Stage IA (T1c, N0, M0) non-small cell lung cancer of the left upper lobe, adenocarcinoma  Interval Since Last Radiation: One month  Radiation Treatment Dates: 04/09/2020 through 04/22/2020 Site Technique Total Dose (Gy) Dose per Fx (Gy) Completed Fx Beam Energies  Lung, Left: Lung_Lt SBRT 60/60 12 5/5 6XFFF    Narrative:  The patient returns today for routine follow-up. No significant interval history since the end of treatment.  On review of systems, she reports improving energy and breathing. She also reports a good appetite and productive cough with occasional green phlegm. She denies fever.              ALLERGIES:  is allergic to tree extract, augmentin [amoxicillin-pot clavulanate], lisinopril, and ciprofloxacin.  Meds: Current Outpatient Medications  Medication Sig Dispense Refill  . acetaminophen (TYLENOL) 325 MG tablet Take 325 mg by mouth every 6 (six) hours as needed (FOR PAIN).    [name] albuterol (PROVENTIL) (2.5 MG/3ML) 0.083% nebulizer solution Take 3 mLs (2.5 mg total) by nebulization every 6 (six) hours as needed for wheezing or shortness of breath. 75 mL 3  . Ascorbic Acid (VITAMIN C) 1000 MG tablet Take 1,000 mg by mouth every other day.     . b complex vitamins tablet Take 1 tablet by mouth daily.    [name] BIKTARVY 50-200-25 MG TABS tablet TAKE 1 TABLET BY MOUTH DAILY 30 tablet 5  . BIOTIN PO Take 1 tablet by mouth daily as needed (for supplementation).     . calcium carbonate (OSCAL) 1500 (600 Ca) MG TABS tablet Take 600 mg of elemental calcium by mouth daily with breakfast.    . Cholecalciferol (SM  VITAMIN D3) 100 MCG (4000 UT) CAPS Take 1 capsule (4,000 Units total) by mouth daily. 30 capsule 0  . cloNIDine (CATAPRES) 0.1 MG tablet Take 1 tablet (0.1 mg total) by mouth every 12 (twelve) hours. 180 tablet 1  . cyanocobalamin (,VITAMIN B-12,) 1000 MCG/ML injection ADMINISTER 1 ML(1000 MCG) IN THE MUSCLE EVERY 30 DAYS 1 mL 0  . cyanocobalamin (,VITAMIN B-12,) 1000 MCG/ML injection Inject 1 mL (1,000 mcg total) into the muscle every 30 (thirty) days. ADMINISTER 1 ML(1000 MCG) IN THE MUSCLE EVERY 30 DAYS 1 mL 1  . diltiazem (CARDIZEM CD) 240 MG 24 hr capsule TAKE 1 CAPSULE(240 MG) BY MOUTH DAILY (Patient taking differently: Take 240 mg by mouth daily. ) 90 capsule 3  . doxycycline (VIBRA-TABS) 100 MG tablet Take 1 tablet (100 mg total) by mouth 2 (two) times daily. 14 tablet 0  . fluticasone furoate-vilanterol (BREO ELLIPTA) 200-25 MCG/INH AEPB Inhale 1 puff into the lungs daily. 1 each 5  . furosemide (LASIX) 20 MG tablet Take 1 tablet (20 mg total) by mouth every other day. 30 tablet 2  . furosemide (LASIX) 20 MG tablet Take 1 tablet (20 mg total) by mouth daily. (Patient taking differently: Take 20 mg by mouth daily. As needed) 90 tablet 0  . MAGNESIUM PO Take 1 tablet by mouth every other day.     . Melatonin 5 MG TABS  Take 5 mg by mouth at bedtime as needed (for sleep).    . Misc. Devices (PULSE OXIMETER FOR FINGER) MISC 1 Units by Does not apply route as needed. 1 each 0  . Mouthwashes (BIOTENE DRY MOUTH GENTLE) LIQD Use as directed 1 Dose in the mouth or throat 2 (two) times daily as needed (dry mouth).    . Omega-3 Fatty Acids (FISH OIL) 1000 MG CAPS Take 1,000 mg by mouth daily as needed (pt prefrence).    [name] omeprazole (PRILOSEC) 40 MG capsule Take 1 capsule (40 mg total) by mouth daily as needed (heart burn).    . OXYGEN Inhale 1.5-2 L into the lungs See admin instructions. Uses at bedtime and through the day as needed    . polyethylene glycol (MIRALAX / GLYCOLAX) 17 g packet Take 17 g by  mouth daily as needed for mild constipation.    [name] PREVIDENT 5000 SENSITIVE 1.1-5 % PSTE Use daily as directed  1  . PROAIR HFA 108 (90 Base) MCG/ACT inhaler Inhale 2 puffs into the lungs every 4 (four) hours as needed for wheezing or shortness of breath. INHALE 2 PUFFS INTO THE LUNGS EVERY 4 HOURS AS NEEDED FOR WHEEZING OR SHORTNESS OF BREATH    . senna (SENOKOT) 8.6 MG tablet Take 1 tablet by mouth daily as needed for constipation.    [name] spironolactone (ALDACTONE) 25 MG tablet Take 1 tablet (25 mg total) by mouth daily. 90 tablet 1  . spironolactone (ALDACTONE) 25 MG tablet Take 0.5 tablets (12.5 mg total) by mouth daily. 90 tablet 1  . SYRINGE/NEEDLE, DISP, 1 ML (B-D SYRINGE/NEEDLE 1CC/25GX5/8) 25G X 5/8" 1 ML MISC 1 Units by Does not apply route every 30 (thirty) days. 50 each 0  . vitamin A 3 MG (10000 UNITS) capsule Take 10,000 Units by mouth daily.    Alveda Reasons 20 MG TABS tablet TAKE 1 TABLET(20 MG) BY MOUTH DAILY WITH SUPPER 90 tablet 0  . zinc gluconate 50 MG tablet Take 50 mg by mouth daily.     No current facility-administered medications for this encounter.    Physical Findings: The patient is in no acute distress. Patient is alert and oriented.  height is 5' 3.5" (1.613 m) and weight is 237 lb (107.5 kg). Her temperature is 97.7 F (36.5 C). Her blood pressure is 131/85 and her pulse is 87. Her respiration is 20 and oxygen saturation is 95%.   No significant changes. Lungs are clear to auscultation bilaterally. Heart has regular rate and rhythm. No palpable cervical, supraclavicular, or axillary adenopathy. Abdomen soft, non-tender, normal bowel sounds.   Lab Findings: Lab Results  Component Value Date   WBC 8.1 02/20/2020   HGB 12.8 02/20/2020   HCT 40.1 02/20/2020   MCV 95.5 02/20/2020   PLT 203 02/20/2020    Radiographic Findings: No results found.  Impression: Stage IA (T1c, N0, M0) non-small cell lung cancer of the left upper lobe, adenocarcinoma  Patient reports  that her breathing overall has improved since completion of her radiation therapy.  She denies any significant cough at this time.  Plan: The patient is scheduled to follow-up with Dr. Julien Nordmann on 08/21/2020. She will follow-up with radiation oncology in 3 months. Prior to this follow-up the patient will undergo a CT scan of the chest.    ____________________________________   Blair Promise, PhD, MD  This document serves as a record of services personally performed by Gery Pray, MD. It was created on his behalf by  Clerance Lav, a trained medical scribe. The creation of this record is based on the scribe's personal observations and the provider's statements to them. This document has been checked and approved by the attending provider.

## 2020-05-23 ENCOUNTER — Ambulatory Visit
Admission: RE | Admit: 2020-05-23 | Discharge: 2020-05-23 | Disposition: A | Discharge status: Home / Self Care | Payer: Medicare Other | Source: Ambulatory Visit | Attending: Radiation Oncology | Admitting: Radiation Oncology

## 2020-05-23 ENCOUNTER — Encounter: Payer: Self-pay | Admitting: Radiation Oncology

## 2020-05-23 ENCOUNTER — Other Ambulatory Visit: Payer: Self-pay | Admitting: Internal Medicine

## 2020-05-23 VITALS — BP 131/85 | HR 87 | Temp 97.7°F | Resp 20 | Ht 63.5 in | Wt 237.0 lb

## 2020-05-23 DIAGNOSIS — R911 Solitary pulmonary nodule: Secondary | ICD-10-CM

## 2020-05-23 DIAGNOSIS — Z923 Personal history of irradiation: Secondary | ICD-10-CM | POA: Diagnosis not present

## 2020-05-23 DIAGNOSIS — Z79899 Other long term (current) drug therapy: Secondary | ICD-10-CM | POA: Diagnosis not present

## 2020-05-23 DIAGNOSIS — C3412 Malignant neoplasm of upper lobe, left bronchus or lung: Secondary | ICD-10-CM | POA: Diagnosis not present

## 2020-05-23 DIAGNOSIS — C3490 Malignant neoplasm of unspecified part of unspecified bronchus or lung: Secondary | ICD-10-CM

## 2020-05-23 DIAGNOSIS — E538 Deficiency of other specified B group vitamins: Secondary | ICD-10-CM

## 2020-05-23 NOTE — Progress Notes (Signed)
Patient here for a 1 month f/u visit with Dr. Sondra Come. Patient states her energy level is much better. She states she can breathe better than 2 years ago. Patient reports coughing up green phlegm frequently and denies a fever. She reports a good appetite.  BP 131/85 (BP Location: Left Wrist, Patient Position: Sitting, Cuff Size: Normal)   Pulse 87   Temp 97.7 F (36.5 C)   Resp 20   Ht 5' 3.5" (1.613 m)   Wt 237 lb (107.5 kg)   SpO2 95%   BMI 41.32 kg/m   Wt Readings from Last 3 Encounters:  05/23/20 237 lb (107.5 kg)  04/24/20 233 lb 6.4 oz (105.9 kg)  04/02/20 232 lb 6.4 oz (105.4 kg)

## 2020-05-24 ENCOUNTER — Other Ambulatory Visit: Payer: Self-pay

## 2020-05-24 ENCOUNTER — Other Ambulatory Visit: Payer: Medicare Other

## 2020-05-24 ENCOUNTER — Other Ambulatory Visit (HOSPITAL_COMMUNITY)
Admission: RE | Admit: 2020-05-24 | Discharge: 2020-05-24 | Disposition: A | Discharge status: Home / Self Care | Payer: Medicare Other | Source: Ambulatory Visit | Attending: Internal Medicine | Admitting: Internal Medicine

## 2020-05-24 DIAGNOSIS — Z113 Encounter for screening for infections with a predominantly sexual mode of transmission: Secondary | ICD-10-CM

## 2020-05-24 DIAGNOSIS — B20 Human immunodeficiency virus [HIV] disease: Secondary | ICD-10-CM

## 2020-05-24 LAB — T-HELPER CELL (CD4) - (RCID CLINIC ONLY)
CD4 % Helper T Cell: 50 % (ref 33–65)
CD4 T Cell Abs: 445 /uL (ref 400–1790)

## 2020-05-27 ENCOUNTER — Telehealth: Payer: Self-pay | Admitting: *Deleted

## 2020-05-27 ENCOUNTER — Telehealth: Payer: Medicare Other

## 2020-05-27 LAB — COMPLETE METABOLIC PANEL WITH GFR
AG Ratio: 1.6 (calc) (ref 1.0–2.5)
ALT: 16 U/L (ref 6–29)
AST: 19 U/L (ref 10–35)
Albumin: 4.1 g/dL (ref 3.6–5.1)
Alkaline phosphatase (APISO): 75 U/L (ref 37–153)
BUN/Creatinine Ratio: 15 (calc) (ref 6–22)
BUN: 20 mg/dL (ref 7–25)
CO2: 33 mmol/L — ABNORMAL HIGH (ref 20–32)
Calcium: 10.1 mg/dL (ref 8.6–10.4)
Chloride: 102 mmol/L (ref 98–110)
Creat: 1.36 mg/dL — ABNORMAL HIGH (ref 0.50–0.99)
GFR, Est African American: 47 mL/min/{1.73_m2} — ABNORMAL LOW (ref >=60)
GFR, Est Non African American: 40 mL/min/{1.73_m2} — ABNORMAL LOW (ref >=60)
Globulin: 2.6 g/dL (calc) (ref 1.9–3.7)
Glucose, Bld: 107 mg/dL — ABNORMAL HIGH (ref 65–99)
Potassium: 4.2 mmol/L (ref 3.5–5.3)
Sodium: 143 mmol/L (ref 135–146)
Total Bilirubin: 0.3 mg/dL (ref 0.2–1.2)
Total Protein: 6.7 g/dL (ref 6.1–8.1)

## 2020-05-27 LAB — CBC WITH DIFFERENTIAL/PLATELET
Absolute Monocytes: 367 cells/uL (ref 200–950)
Basophils Absolute: 31 cells/uL (ref 0–200)
Basophils Relative: 0.8 %
Eosinophils Absolute: 101 cells/uL (ref 15–500)
Eosinophils Relative: 2.6 %
HCT: 39.3 % (ref 35.0–45.0)
Hemoglobin: 12.9 g/dL (ref 11.7–15.5)
Lymphs Abs: 1045 cells/uL (ref 850–3900)
MCH: 30.6 pg (ref 27.0–33.0)
MCHC: 32.8 g/dL (ref 32.0–36.0)
MCV: 93.3 fL (ref 80.0–100.0)
MPV: 10.8 fL (ref 7.5–12.5)
Monocytes Relative: 9.4 %
Neutro Abs: 2356 cells/uL (ref 1500–7800)
Neutrophils Relative %: 60.4 %
Platelets: 194 10*3/uL (ref 140–400)
RBC: 4.21 10*6/uL (ref 3.80–5.10)
RDW: 12.4 % (ref 11.0–15.0)
Total Lymphocyte: 26.8 %
WBC: 3.9 10*3/uL (ref 3.8–10.8)

## 2020-05-27 LAB — RPR: RPR Ser Ql: NONREACTIVE

## 2020-05-27 LAB — URINE CYTOLOGY ANCILLARY ONLY
Chlamydia: NEGATIVE
Comment: NEGATIVE
Comment: NORMAL
Neisseria Gonorrhea: NEGATIVE

## 2020-05-27 LAB — HIV-1 RNA QUANT-NO REFLEX-BLD
HIV 1 RNA Quant: <20 Copies/mL
HIV-1 RNA Quant, Log: <1.3 Log cps/mL

## 2020-05-27 NOTE — Telephone Encounter (Signed)
  Chronic Care Management   Outreach Note  05/27/2020 Name: Shannon [name] MRN: [id number] DOB: March 07, 1956  Referred by: [name], MD Reason for referral : Chronic Care Management (HTN,CKD,HLD,Asthma,COPD,HF, CAD, HIV new dx lung Ca)   An unsuccessful telephone outreach was attempted today. The patient was referred to the case management team for assistance with care management and care coordination.   Follow Up Plan: A HIPAA compliant phone message was left for the patient providing contact information and requesting a return call.  The care management team will reach out to the patient again over the next 7-10 days.   SIGNATURE

## 2020-05-28 ENCOUNTER — Telehealth (HOSPITAL_COMMUNITY): Payer: Self-pay | Admitting: Cardiovascular Disease

## 2020-05-28 NOTE — Telephone Encounter (Signed)
Will forward to provider and nurse as Juluis Rainier.

## 2020-05-28 NOTE — Telephone Encounter (Signed)
Patient called and has cancelled [facility] for 05/30/20. Patient has now cancelled multiple appointments. She will call back at a later date to reschedule. Order will be removed from the [facility] and if she calls back we will reinstate the order. Thank you.

## 2020-05-30 ENCOUNTER — Ambulatory Visit (HOSPITAL_COMMUNITY)
Admission: RE | Admit: 2020-05-30 | Payer: Medicare Other | Source: Ambulatory Visit | Attending: Cardiovascular Disease | Admitting: Cardiovascular Disease

## 2020-05-31 ENCOUNTER — Telehealth: Payer: Medicare Other

## 2020-05-31 ENCOUNTER — Ambulatory Visit: Payer: Medicare Other | Admitting: Cardiovascular Disease

## 2020-06-03 ENCOUNTER — Telehealth: Payer: Medicare Other

## 2020-06-03 ENCOUNTER — Telehealth: Payer: Self-pay | Admitting: *Deleted

## 2020-06-03 NOTE — Telephone Encounter (Signed)
°  Chronic Care Management   Outreach Note  06/03/2020 Name: Shannon [name] MRN: [id number] DOB: Dec 23, 1955  Referred by: [name], MD Reason for referral : Chronic Care Management (HTN,CKD,HLD,Asthma,COPD,HF, CAD, HIV new dx lung C)   Third unsuccessful telephone outreach was attempted today. The patient was referred to the case management team for assistance with care management and care coordination.   Follow Up Plan: A HIPAA compliant phone message was left for the patient providing contact information and requesting a return call.  The care management team will reach out to the patient again over the next 7-10 days.   Kelli Churn RN, CCM, [facility] RN Care Manager [phone]

## 2020-06-04 ENCOUNTER — Ambulatory Visit: Payer: Medicare Other | Admitting: *Deleted

## 2020-06-04 DIAGNOSIS — I5032 Chronic diastolic (congestive) heart failure: Secondary | ICD-10-CM

## 2020-06-04 DIAGNOSIS — B20 Human immunodeficiency virus [HIV] disease: Secondary | ICD-10-CM

## 2020-06-04 DIAGNOSIS — N183 Chronic kidney disease, stage 3 unspecified: Secondary | ICD-10-CM

## 2020-06-04 DIAGNOSIS — I10 Essential (primary) hypertension: Secondary | ICD-10-CM

## 2020-06-04 NOTE — Chronic Care Management (AMB) (Addendum)
  Chronic Care Management   Note  06/04/2020 Name: Shannon [name] MRN: [id number] DOB: October 27, 1955   Patient returned call to this CCM RN (after 2 prior messages were left by this CCM RN requesting a return call) and left message with the following information: says she saw Dr. Sondra Come, radiation oncologist,on Sept 20th and will return again in 3 months, says she will see another provider on Oct 14th to see about her lift chair and air concentrator.    Follow up plan: The care management team will reach out to the patient again over the next 3-5 days.   Kelli Churn RN, CCM, [facility] RN Care Manager [phone]

## 2020-06-05 ENCOUNTER — Ambulatory Visit: Payer: Medicare Other | Admitting: *Deleted

## 2020-06-05 DIAGNOSIS — N183 Chronic kidney disease, stage 3 unspecified: Secondary | ICD-10-CM

## 2020-06-05 DIAGNOSIS — I5032 Chronic diastolic (congestive) heart failure: Secondary | ICD-10-CM

## 2020-06-05 DIAGNOSIS — B20 Human immunodeficiency virus [HIV] disease: Secondary | ICD-10-CM

## 2020-06-05 DIAGNOSIS — I10 Essential (primary) hypertension: Secondary | ICD-10-CM

## 2020-06-05 NOTE — Patient Instructions (Signed)
Visit Information It was nice speaking with you today.  Goals Addressed              This Visit's Progress     Patient Stated   .  "I have decided I don't want personal care services" (pt-stated)        CARE PLAN ENTRY (see longitudinal plan of care for additional care plan information)  Current Barriers:  . Chronic Disease Management support, education, and care coordination needs related to CAD, HTN, HLD, COPD, CKD Stage 3, Anxiety, and Asthma- spoke with patient , say she is doing well, says she is able to navigate the stairs in her home without excessive dyspnea, has many different providers appointments coming up including dental appointment with Dr. Graciella Freer to have her dentures adjusted, with her eye MD Dr Idolina Primer, she is scheduled for a bone density test, with her pulmonologist Dr Elsworth Soho or his NP Rexene Edison and she is going to [employer] in [place] tomorrow for a portable oxygen concentrator and lift chair. She reports her severe back pain has resolved. She says the very uncomfortable hospital bed was picked up  per her request. she again voiced much appreciation for the assistance of the CCM team.   Clinical Goal(s) related to CAD, HTN, HLD, COPD, CKD Stage 3, Anxiety, and Asthma:  Over the next 30 days, patient will:  . Work with the care management team to address educational, disease management, and care coordination needs  . Begin or continue self health monitoring activities as directed today  work with care management team to secure DME and home care assistance . Call provider office for new or worsened signs and symptoms CAD, HTN, HLD, COPD, CKD Stage 3, Anxiety, and Asthma . Call care management team with questions or concerns . Verbalize basic understanding of patient centered plan of care established today  Interventions related to CAD, HTN, HLD, COPD, CKD Stage 3, Anxiety, and Asthma:    . Inter-disciplinary care team collaboration (see longitudinal plan of  care) . Evaluation of current treatment plan related to HTN, asthma,  COPD and HIV patient's adherence to plan as established by provider. . Shared this CCM RN's concerns about patient deciding against personal care services since she lives alone in a 2 story apartment and has no family of friends in town . Reviewed medications with patient and discussed medication taking behavior . Reviewed upcoming appointments and ensured she has transportation. . Discussed plans with patient for ongoing care management follow up and ensured patient has contact number for CCM team and clinic . Please see past updates related to this goal by clicking on the "Past Updates" button in the selected goal      .  "I need a lift chair" (pt-stated)        [place] (see longitudinal plan of care for additional care plan information)  Current Barriers:  . Patient seeking lift chair.  States that she has contacted [employer] and was told that chair is covered.  She says she will go to the [employer] store in [place] on 10/14 for lift chair and portable O2 concentrator.   Clinical Social Work Clinical Goal(s):  [name] Over the next 90 days, patient will work with SW to address concerns related to need for DME/lift chair  Interventions: . Assessed status of securing lift chair  Patient Self Care Activities:  . Self administers medications as prescribed . Calls provider office for new concerns or questions . Unable  to perform IADLs independently  Please see past updates related to this goal by clicking on the "Past Updates" button in the selected goal         The patient verbalized understanding of instructions provided today and declined a print copy of patient instruction materials.   The care management team will reach out to the patient again over the next 30-60 days.   Kelli Churn RN, CCM, [facility] RN Care Manager [phone]

## 2020-06-05 NOTE — Chronic Care Management (AMB) (Signed)
Chronic Care Management   Follow Up Note   06/05/2020 Name: Shannon [name] MRN: [id number] DOB: 1956-08-08  Referred by: [name], MD Reason for referral : Chronic Care Management (HTN,CKD,HLD,Asthma,COPD,HF, CAD, HIV new dx lung Ca)   Shannon [name] is a 64 y.o. year old female who is a primary care patient of [name], MD. The CCM team was consulted for assistance with chronic disease management and care coordination needs.    Review of patient status, including review of consultants reports, relevant laboratory and other test results, and collaboration with appropriate care team members and the patient's provider was performed as part of comprehensive patient evaluation and provision of chronic care management services.    SDOH (Social Determinants of Health) assessments performed: No See Care Plan activities for detailed interventions related to [facility])     Outpatient Encounter Medications as of 06/05/2020  Medication Sig  . acetaminophen (TYLENOL) 325 MG tablet Take 325 mg by mouth every 6 (six) hours as needed (FOR PAIN).  [name] albuterol (PROVENTIL) (2.5 MG/3ML) 0.083% nebulizer solution Take 3 mLs (2.5 mg total) by nebulization every 6 (six) hours as needed for wheezing or shortness of breath.  . Ascorbic Acid (VITAMIN C) 1000 MG tablet Take 1,000 mg by mouth every other day.   . b complex vitamins tablet Take 1 tablet by mouth daily.  [name] BIKTARVY 50-200-25 MG TABS tablet TAKE 1 TABLET BY MOUTH DAILY  . BIOTIN PO Take 1 tablet by mouth daily as needed (for supplementation).   . calcium carbonate (OSCAL) 1500 (600 Ca) MG TABS tablet Take 600 mg of elemental calcium by mouth daily with breakfast.  . Cholecalciferol (SM VITAMIN D3) 100 MCG (4000 UT) CAPS Take 1 capsule (4,000 Units total) by mouth daily.  . cloNIDine (CATAPRES) 0.1 MG tablet Take 1 tablet (0.1 mg total) by mouth every 12 (twelve) hours.  . cyanocobalamin (,VITAMIN B-12,) 1000 MCG/ML injection ADMINISTER 1  ML(1000 MCG) IN THE MUSCLE EVERY 30 DAYS  . cyanocobalamin (,VITAMIN B-12,) 1000 MCG/ML injection INJECT 1 ML IN THE MUSCLE EVERY 30 DAYS  . diltiazem (CARDIZEM CD) 240 MG 24 hr capsule TAKE 1 CAPSULE(240 MG) BY MOUTH DAILY (Patient taking differently: Take 240 mg by mouth daily. )  . doxycycline (VIBRA-TABS) 100 MG tablet Take 1 tablet (100 mg total) by mouth 2 (two) times daily.  . fluticasone furoate-vilanterol (BREO ELLIPTA) 200-25 MCG/INH AEPB Inhale 1 puff into the lungs daily.  . furosemide (LASIX) 20 MG tablet Take 1 tablet (20 mg total) by mouth every other day.  . furosemide (LASIX) 20 MG tablet Take 1 tablet (20 mg total) by mouth daily. (Patient taking differently: Take 20 mg by mouth daily. As needed)  . MAGNESIUM PO Take 1 tablet by mouth every other day.   . Melatonin 5 MG TABS Take 5 mg by mouth at bedtime as needed (for sleep).  . Misc. Devices (PULSE OXIMETER FOR FINGER) MISC 1 Units by Does not apply route as needed.  . Mouthwashes (BIOTENE DRY MOUTH GENTLE) LIQD Use as directed 1 Dose in the mouth or throat 2 (two) times daily as needed (dry mouth).  . Omega-3 Fatty Acids (FISH OIL) 1000 MG CAPS Take 1,000 mg by mouth daily as needed (pt prefrence).  [name] omeprazole (PRILOSEC) 40 MG capsule Take 1 capsule (40 mg total) by mouth daily as needed (heart burn).  . OXYGEN Inhale 1.5-2 L into the lungs See admin instructions. Uses at bedtime and through the day  as needed  . polyethylene glycol (MIRALAX / GLYCOLAX) 17 g packet Take 17 g by mouth daily as needed for mild constipation.  [name] PREVIDENT 5000 SENSITIVE 1.1-5 % PSTE Use daily as directed  . PROAIR HFA 108 (90 Base) MCG/ACT inhaler Inhale 2 puffs into the lungs every 4 (four) hours as needed for wheezing or shortness of breath. INHALE 2 PUFFS INTO THE LUNGS EVERY 4 HOURS AS NEEDED FOR WHEEZING OR SHORTNESS OF BREATH  . senna (SENOKOT) 8.6 MG tablet Take 1 tablet by mouth daily as needed for constipation.  [name] spironolactone  (ALDACTONE) 25 MG tablet Take 1 tablet (25 mg total) by mouth daily.  [name] spironolactone (ALDACTONE) 25 MG tablet Take 0.5 tablets (12.5 mg total) by mouth daily.  . SYRINGE/NEEDLE, DISP, 1 ML (B-D SYRINGE/NEEDLE 1CC/25GX5/8) 25G X 5/8" 1 ML MISC 1 Units by Does not apply route every 30 (thirty) days.  . vitamin A 3 MG (10000 UNITS) capsule Take 10,000 Units by mouth daily.  Alveda Reasons 20 MG TABS tablet TAKE 1 TABLET(20 MG) BY MOUTH DAILY WITH SUPPER  . zinc gluconate 50 MG tablet Take 50 mg by mouth daily.   No facility-administered encounter medications on file as of 06/05/2020.     Objective:  Wt Readings from Last 3 Encounters:  05/23/20 237 lb (107.5 kg)  04/24/20 233 lb 6.4 oz (105.9 kg)  04/02/20 232 lb 6.4 oz (105.4 kg)   BP Readings from Last 3 Encounters:  05/23/20 131/85  04/24/20 126/62  04/02/20 (!) 158/74    Goals Addressed              This Visit's Progress     Patient Stated   .  "I have decided I don't want personal care services" (pt-stated)        CARE PLAN ENTRY (see longitudinal plan of care for additional care plan information)  Current Barriers:  . Chronic Disease Management support, education, and care coordination needs related to CAD, HTN, HLD, COPD, CKD Stage 3, Anxiety, and Asthma- spoke with patient , say she is doing well, says she is able to navigate the stairs in her home without excessive dyspnea, has many different providers appointments coming up including dental appointment with Dr. Graciella Freer to have her dentures adjusted, with her eye MD Dr Idolina Primer, she is scheduled for a bone density test, with her pulmonologist Dr Elsworth Soho or his NP Rexene Edison and she is going to [employer] in [place] tomorrow for a portable oxygen concentrator and lift chair. She reports her severe back pain has resolved. She says the very uncomfortable hospital bed was picked up  per her request. she again voiced much appreciation for the assistance of the CCM team.   Clinical  Goal(s) related to CAD, HTN, HLD, COPD, CKD Stage 3, Anxiety, and Asthma:  Over the next 30 days, patient will:  . Work with the care management team to address educational, disease management, and care coordination needs  . Begin or continue self health monitoring activities as directed today  work with care management team to secure DME and home care assistance . Call provider office for new or worsened signs and symptoms CAD, HTN, HLD, COPD, CKD Stage 3, Anxiety, and Asthma . Call care management team with questions or concerns . Verbalize basic understanding of patient centered plan of care established today  Interventions related to CAD, HTN, HLD, COPD, CKD Stage 3, Anxiety, and Asthma:    . Inter-disciplinary care team collaboration (see longitudinal plan  of care) . Evaluation of current treatment plan related to HTN, asthma,  COPD and HIV patient's adherence to plan as established by provider. . Shared this CCM RN's concerns about patient deciding against personal care services since she lives alone in a 2 story apartment and has no family of friends in town . Reviewed medications with patient and discussed medication taking behavior . Reviewed upcoming appointments and ensured she has transportation. . Discussed plans with patient for ongoing care management follow up and ensured patient has contact number for CCM team and clinic . Please see past updates related to this goal by clicking on the "Past Updates" button in the selected goal      .  "I need a lift chair" (pt-stated)        [place] (see longitudinal plan of care for additional care plan information)  Current Barriers:  . Patient seeking lift chair.  States that she has contacted [employer] and was told that chair is covered.  She says she will go to the [employer] store in [place] on 10/14 for lift chair and portable O2 concentrator.   Clinical Social Work Clinical Goal(s):  [name] Over the next 90 days,  patient will work with SW to address concerns related to need for DME/lift chair  Interventions: . Assessed status of securing lift chair  Patient Self Care Activities:  . Self administers medications as prescribed . Calls provider office for new concerns or questions . Unable to perform IADLs independently  Please see past updates related to this goal by clicking on the "Past Updates" button in the selected goal          Plan:   The care management team will reach out to the patient again over the next 30-60 days.    Kelli Churn RN, CCM, [facility] RN Care Manager [phone]

## 2020-06-06 NOTE — Progress Notes (Addendum)
Internal Medicine Clinic Resident  I have personally reviewed this encounter including the documentation in this note and/or discussed this patient with the care management provider. I will address any urgent items identified by the care management provider and will communicate my actions to the patient's PCP. I have reviewed the patient's CCM visit with my supervising attending, Dr Jimmye Norman.  Delice Bison, DO 06/06/2020     Internal Medicine Attending attestation: I agree with the findings and plan of this CCM encounter. Dorian Pod, MD

## 2020-06-07 ENCOUNTER — Telehealth: Payer: Medicare Other

## 2020-06-10 ENCOUNTER — Ambulatory Visit: Payer: Medicare Other | Admitting: Adult Health

## 2020-06-10 ENCOUNTER — Telehealth: Payer: Self-pay

## 2020-06-10 ENCOUNTER — Telehealth: Payer: Medicare Other

## 2020-06-10 NOTE — Telephone Encounter (Signed)
  Chronic Care Management   Outreach Note  06/10/2020 Name: ANALAURA MESSLER MRN: [id number] DOB: Jan 18, 1956  Referred by: [name], MD Reason for referral : Care Coordination (DME)   An unsuccessful telephone outreach was attempted today. The patient was referred to the case management team for assistance with care management and care coordination.   Follow Up Plan: A HIPAA compliant phone message was left for the patient providing contact information and requesting a return call.      Ronn Melena, [place] Coordination Social Worker [address] [phone]

## 2020-06-11 ENCOUNTER — Other Ambulatory Visit: Payer: Self-pay | Admitting: Internal Medicine

## 2020-06-11 DIAGNOSIS — E538 Deficiency of other specified B group vitamins: Secondary | ICD-10-CM

## 2020-06-11 DIAGNOSIS — H40013 Open angle with borderline findings, low risk, bilateral: Secondary | ICD-10-CM | POA: Diagnosis not present

## 2020-06-11 NOTE — Telephone Encounter (Signed)
Need refill on .cyanocobalamin (,VITAMIN B-12,) 1000 MCG/ML injection ;pt contact [facility] [place] [facility] [place] [facility] [place]

## 2020-06-12 DIAGNOSIS — G4733 Obstructive sleep apnea (adult) (pediatric): Secondary | ICD-10-CM | POA: Diagnosis not present

## 2020-06-12 DIAGNOSIS — J449 Chronic obstructive pulmonary disease, unspecified: Secondary | ICD-10-CM | POA: Diagnosis not present

## 2020-06-12 DIAGNOSIS — R269 Unspecified abnormalities of gait and mobility: Secondary | ICD-10-CM | POA: Diagnosis not present

## 2020-06-12 DIAGNOSIS — J45909 Unspecified asthma, uncomplicated: Secondary | ICD-10-CM | POA: Diagnosis not present

## 2020-06-13 ENCOUNTER — Telehealth: Payer: Self-pay

## 2020-06-13 ENCOUNTER — Telehealth: Payer: Medicare Other

## 2020-06-13 NOTE — Telephone Encounter (Signed)
  Chronic Care Management   Outreach Note  06/13/2020 Name: Shannon [name] MRN: [id number] DOB: 1956-08-19  Referred by: [name], MD Reason for referral : Care Coordination (DME)   A second unsuccessful telephone outreach was attempted today. The patient was referred to the case management team for assistance with care management and care coordination.   Follow Up Plan: A HIPAA compliant phone message was left for the patient providing contact information and requesting a return call.     Ronn Melena, [place] Coordination Social Worker [place] [phone]

## 2020-06-17 ENCOUNTER — Ambulatory Visit: Payer: Medicare Other | Admitting: Internal Medicine

## 2020-06-18 ENCOUNTER — Ambulatory Visit: Payer: Medicare Other | Admitting: *Deleted

## 2020-06-18 ENCOUNTER — Ambulatory Visit: Payer: Medicare Other | Admitting: Adult Health

## 2020-06-18 ENCOUNTER — Telehealth: Payer: Self-pay | Admitting: Pulmonary Disease

## 2020-06-18 NOTE — Chronic Care Management (AMB) (Signed)
Chronic Care Management   Follow Up Note   06/18/2020 Name: Shannon [name] MRN: [id number] DOB: 1955/10/13  Referred by: [name], MD Reason for referral : Chronic Care Management (HTN,CKD,HLD,Asthma,COPD,HF, CAD, HIV, new dx lung Ca)   Shannon [name] is a 64 y.o. year old female who is a primary care patient of [name], MD. The CCM team was consulted for assistance with chronic disease management and care coordination needs.    Review of patient status, including review of consultants reports, relevant laboratory and other test results, and collaboration with appropriate care team members and the patient's provider was performed as part of comprehensive patient evaluation and provision of chronic care management services.    SDOH (Social Determinants of Health) assessments performed: No See Care Plan activities for detailed interventions related to [facility])     Outpatient Encounter Medications as of 06/18/2020  Medication Sig  . acetaminophen (TYLENOL) 325 MG tablet Take 325 mg by mouth every 6 (six) hours as needed (FOR PAIN).  [name] albuterol (PROVENTIL) (2.5 MG/3ML) 0.083% nebulizer solution Take 3 mLs (2.5 mg total) by nebulization every 6 (six) hours as needed for wheezing or shortness of breath.  . Ascorbic Acid (VITAMIN C) 1000 MG tablet Take 1,000 mg by mouth every other day.   . b complex vitamins tablet Take 1 tablet by mouth daily.  [name] BIKTARVY 50-200-25 MG TABS tablet TAKE 1 TABLET BY MOUTH DAILY  . BIOTIN PO Take 1 tablet by mouth daily as needed (for supplementation).   . calcium carbonate (OSCAL) 1500 (600 Ca) MG TABS tablet Take 600 mg of elemental calcium by mouth daily with breakfast.  . Cholecalciferol (SM VITAMIN D3) 100 MCG (4000 UT) CAPS Take 1 capsule (4,000 Units total) by mouth daily.  . cloNIDine (CATAPRES) 0.1 MG tablet Take 1 tablet (0.1 mg total) by mouth every 12 (twelve) hours.  . cyanocobalamin (,VITAMIN B-12,) 1000 MCG/ML injection INJECT 1 ML  IN THE MUSCLE EVERY 30 DAYS  . cyanocobalamin (,VITAMIN B-12,) 1000 MCG/ML injection ADMINISTER 1 ML(1000 MCG) IN THE MUSCLE EVERY 30 DAYS  . diltiazem (CARDIZEM CD) 240 MG 24 hr capsule TAKE 1 CAPSULE(240 MG) BY MOUTH DAILY (Patient taking differently: Take 240 mg by mouth daily. )  . doxycycline (VIBRA-TABS) 100 MG tablet Take 1 tablet (100 mg total) by mouth 2 (two) times daily.  . fluticasone furoate-vilanterol (BREO ELLIPTA) 200-25 MCG/INH AEPB Inhale 1 puff into the lungs daily.  . furosemide (LASIX) 20 MG tablet Take 1 tablet (20 mg total) by mouth every other day.  . furosemide (LASIX) 20 MG tablet Take 1 tablet (20 mg total) by mouth daily. (Patient taking differently: Take 20 mg by mouth daily. As needed)  . MAGNESIUM PO Take 1 tablet by mouth every other day.   . Melatonin 5 MG TABS Take 5 mg by mouth at bedtime as needed (for sleep).  . Misc. Devices (PULSE OXIMETER FOR FINGER) MISC 1 Units by Does not apply route as needed.  . Mouthwashes (BIOTENE DRY MOUTH GENTLE) LIQD Use as directed 1 Dose in the mouth or throat 2 (two) times daily as needed (dry mouth).  . Omega-3 Fatty Acids (FISH OIL) 1000 MG CAPS Take 1,000 mg by mouth daily as needed (pt prefrence).  [name] omeprazole (PRILOSEC) 40 MG capsule Take 1 capsule (40 mg total) by mouth daily as needed (heart burn).  . OXYGEN Inhale 1.5-2 L into the lungs See admin instructions. Uses at bedtime and through the day  as needed  . polyethylene glycol (MIRALAX / GLYCOLAX) 17 g packet Take 17 g by mouth daily as needed for mild constipation.  [name] PREVIDENT 5000 SENSITIVE 1.1-5 % PSTE Use daily as directed  . PROAIR HFA 108 (90 Base) MCG/ACT inhaler Inhale 2 puffs into the lungs every 4 (four) hours as needed for wheezing or shortness of breath. INHALE 2 PUFFS INTO THE LUNGS EVERY 4 HOURS AS NEEDED FOR WHEEZING OR SHORTNESS OF BREATH  . senna (SENOKOT) 8.6 MG tablet Take 1 tablet by mouth daily as needed for constipation.  [name] spironolactone  (ALDACTONE) 25 MG tablet Take 1 tablet (25 mg total) by mouth daily.  [name] spironolactone (ALDACTONE) 25 MG tablet Take 0.5 tablets (12.5 mg total) by mouth daily.  . SYRINGE/NEEDLE, DISP, 1 ML (B-D SYRINGE/NEEDLE 1CC/25GX5/8) 25G X 5/8" 1 ML MISC 1 Units by Does not apply route every 30 (thirty) days.  . vitamin A 3 MG (10000 UNITS) capsule Take 10,000 Units by mouth daily.  Alveda Reasons 20 MG TABS tablet TAKE 1 TABLET(20 MG) BY MOUTH DAILY WITH SUPPER  . zinc gluconate 50 MG tablet Take 50 mg by mouth daily.   No facility-administered encounter medications on file as of 06/18/2020.     Objective:  Wt Readings from Last 3 Encounters:  05/23/20 237 lb (107.5 kg)  04/24/20 233 lb 6.4 oz (105.9 kg)  04/02/20 232 lb 6.4 oz (105.4 kg)   BP Readings from Last 3 Encounters:  05/23/20 131/85  04/24/20 126/62  04/02/20 (!) 158/74    Goals Addressed              This Visit's Progress     Patient Stated   .  "I need a lift chair and my O2 concentrator delivered. I am very frustrated because I can't get through to [employer]."" (pt-stated)        CARE PLAN ENTRY (see longitudinal plan of care for additional care plan information)  Current Barriers:  . Patient seeking lift chair and O2 concentrator.   Patient called this CCM RN very upset and frustrated stating she was told the motor for the lift chair would not be covered by her insurance when she went to the [employer] store on 10/14 to be fitted for the lift chair, she says this was not what she was told by customer service at [facility]. She said what portion the [employer] Medicare plan doesn't pay , Medicaid will pay the coinsurance. She also says she was given a certificate of medical necessity at the [facility] but  she was not told what to do with it. She shared that she is very frustrated that O2 tanks instead of an O2 concentrator were delivered to her home as she was leaving for a provider appointment and that despite many  calls she cannot get through to [employer] to find out why they have not delivered her O2 concentrator to her. She is requesting the assistance of this CCM RN.   Clinical Social Work Clinical Goal(s):  [name] Over the next 90 days, patient will work with SW to address concerns related to need for DME/lift chair  Interventions: . Provided opportunity for patient to vent her frustrations with securing needed DME of a lift chair and O2 concentrator . Assured patient this CCM RN will advocate for her in securing the equipment . Advised patient ht the certificate of medical necessity will need to be completed by the provider ordering the lift chair, suggested  she might be able to take a picture of the form and e-mail to the provider office since she has to arrange transportation . Messaged [employer] representative via community message pool requesting assistance in facilitating delivery of O2 concentrator  Patient Self Care Activities:  . Self administers medications as prescribed . Calls provider office for new concerns or questions . Unable to perform IADLs independently  Please see past updates related to this goal by clicking on the "Past Updates" button in the selected goal          Plan:   The care management team will reach out to the patient again over the next 30 days.    Kelli Churn RN, CCM, [facility] RN Care Manager [phone]

## 2020-06-18 NOTE — Progress Notes (Signed)
Internal Medicine Clinic Resident  I have personally reviewed this encounter including the documentation in this note and/or discussed this patient with the care management provider. I will address any urgent items identified by the care management provider and will communicate my actions to the patient's PCP. I have reviewed the patient's CCM visit with my supervising attending, Dr Vincent.  Renette Hsu, MD  [facility] PGY-2 06/18/2020    

## 2020-06-18 NOTE — Telephone Encounter (Signed)
Called and spoke with patient who states that she has CMN forms that need to be filled out. Patient has an appointment on Monday 06/24/2020 with Tammy. Provided patient with fax number for the office advised her to fax them if she could before Monday so that we can get them started. She expressed understanding. Nothing further needed at this time.

## 2020-06-19 ENCOUNTER — Telehealth: Payer: Self-pay | Admitting: Pulmonary Disease

## 2020-06-19 ENCOUNTER — Ambulatory Visit: Payer: Medicare Other | Admitting: *Deleted

## 2020-06-19 DIAGNOSIS — C3492 Malignant neoplasm of unspecified part of left bronchus or lung: Secondary | ICD-10-CM

## 2020-06-19 DIAGNOSIS — J441 Chronic obstructive pulmonary disease with (acute) exacerbation: Secondary | ICD-10-CM

## 2020-06-19 DIAGNOSIS — I5032 Chronic diastolic (congestive) heart failure: Secondary | ICD-10-CM

## 2020-06-19 DIAGNOSIS — I2510 Atherosclerotic heart disease of native coronary artery without angina pectoris: Secondary | ICD-10-CM

## 2020-06-19 DIAGNOSIS — N183 Chronic kidney disease, stage 3 unspecified: Secondary | ICD-10-CM

## 2020-06-19 DIAGNOSIS — B20 Human immunodeficiency virus [HIV] disease: Secondary | ICD-10-CM

## 2020-06-19 DIAGNOSIS — I10 Essential (primary) hypertension: Secondary | ICD-10-CM

## 2020-06-19 NOTE — Progress Notes (Signed)
Internal Medicine Clinic Attending  CCM services provided by the care management provider and their documentation were discussed with Dr. Marva Panda. We reviewed the pertinent findings, urgent action items addressed by the resident and non-urgent items to be addressed by the PCP.  I agree with the assessment, diagnosis, and plan of care documented in the CCM and resident's note.  Axel Filler, MD 06/19/2020

## 2020-06-19 NOTE — Telephone Encounter (Signed)
Spoke with pt who stated she was told by [employer] that she was denied a POC. The POC order was placed in last OV with Tammy Parrett. At this time [employer] is not able to provide POCs to pts that are already on O2 related to POC shortage. This was explained to pt. Pt stated that she did not have any portable tanks so order was placed for pt to get back up tanks form [employer]. Pt stated understanding. Pt has OV with Tammy on Monday 06/24/20. Nothing further needed at this time.

## 2020-06-19 NOTE — Progress Notes (Signed)
Internal Medicine Clinic Resident  I have personally reviewed this encounter including the documentation in this note and/or discussed this patient with the care management provider. I will address any urgent items identified by the care management provider and will communicate my actions to the patient's PCP. I have reviewed the patient's CCM visit with my supervising attending, Dr Evette Doffing.  Harvie Heck, MD  [facility] PGY-2 06/19/2020

## 2020-06-19 NOTE — Chronic Care Management (AMB) (Signed)
Chronic Care Management   Follow Up Note   06/19/2020 Name: [name] MRN: [id number] DOB: 11-Jun-1956  Referred by: [name], MD Reason for referral : Chronic Care Management (HTN,CKD,HLD,Asthma,COPD,HF, CAD, HIV, new dx lung Ca)   Shannon [name] is a 64 y.o. year old female who is a primary care patient of [name], MD. The CCM team was consulted for assistance with chronic disease management and care coordination needs.    Review of patient status, including review of consultants reports, relevant laboratory and other test results, and collaboration with appropriate care team members and the patient's provider was performed as part of comprehensive patient evaluation and provision of chronic care management services.    SDOH (Social Determinants of Health) assessments performed: No See Care Plan activities for detailed interventions related to [facility])     Outpatient Encounter Medications as of 06/19/2020  Medication Sig   acetaminophen (TYLENOL) 325 MG tablet Take 325 mg by mouth every 6 (six) hours as needed (FOR PAIN).   albuterol (PROVENTIL) (2.5 MG/3ML) 0.083% nebulizer solution Take 3 mLs (2.5 mg total) by nebulization every 6 (six) hours as needed for wheezing or shortness of breath.   Ascorbic Acid (VITAMIN C) 1000 MG tablet Take 1,000 mg by mouth every other day.    b complex vitamins tablet Take 1 tablet by mouth daily.   BIKTARVY 50-200-25 MG TABS tablet TAKE 1 TABLET BY MOUTH DAILY   BIOTIN PO Take 1 tablet by mouth daily as needed (for supplementation).    calcium carbonate (OSCAL) 1500 (600 Ca) MG TABS tablet Take 600 mg of elemental calcium by mouth daily with breakfast.   Cholecalciferol (SM VITAMIN D3) 100 MCG (4000 UT) CAPS Take 1 capsule (4,000 Units total) by mouth daily.   cloNIDine (CATAPRES) 0.1 MG tablet Take 1 tablet (0.1 mg total) by mouth every 12 (twelve) hours.   cyanocobalamin (,VITAMIN B-12,) 1000 MCG/ML injection INJECT 1 ML  IN THE MUSCLE EVERY 30 DAYS   cyanocobalamin (,VITAMIN B-12,) 1000 MCG/ML injection ADMINISTER 1 ML(1000 MCG) IN THE MUSCLE EVERY 30 DAYS   diltiazem (CARDIZEM CD) 240 MG 24 hr capsule TAKE 1 CAPSULE(240 MG) BY MOUTH DAILY (Patient taking differently: Take 240 mg by mouth daily. )   doxycycline (VIBRA-TABS) 100 MG tablet Take 1 tablet (100 mg total) by mouth 2 (two) times daily.   fluticasone furoate-vilanterol (BREO ELLIPTA) 200-25 MCG/INH AEPB Inhale 1 puff into the lungs daily.   furosemide (LASIX) 20 MG tablet Take 1 tablet (20 mg total) by mouth every other day.   furosemide (LASIX) 20 MG tablet Take 1 tablet (20 mg total) by mouth daily. (Patient taking differently: Take 20 mg by mouth daily. As needed)   MAGNESIUM PO Take 1 tablet by mouth every other day.    Melatonin 5 MG TABS Take 5 mg by mouth at bedtime as needed (for sleep).   Misc. Devices (PULSE OXIMETER FOR FINGER) MISC 1 Units by Does not apply route as needed.   Mouthwashes (BIOTENE DRY MOUTH GENTLE) LIQD Use as directed 1 Dose in the mouth or throat 2 (two) times daily as needed (dry mouth).   Omega-3 Fatty Acids (FISH OIL) 1000 MG CAPS Take 1,000 mg by mouth daily as needed (pt prefrence).   omeprazole (PRILOSEC) 40 MG capsule Take 1 capsule (40 mg total) by mouth daily as needed (heart burn).   OXYGEN Inhale 1.5-2 L into the lungs See admin instructions. Uses at bedtime and through the day  as needed   polyethylene glycol (MIRALAX / GLYCOLAX) 17 g packet Take 17 g by mouth daily as needed for mild constipation.   PREVIDENT 5000 SENSITIVE 1.1-5 % PSTE Use daily as directed   PROAIR HFA 108 (90 Base) MCG/ACT inhaler Inhale 2 puffs into the lungs every 4 (four) hours as needed for wheezing or shortness of breath. INHALE 2 PUFFS INTO THE LUNGS EVERY 4 HOURS AS NEEDED FOR WHEEZING OR SHORTNESS OF BREATH   senna (SENOKOT) 8.6 MG tablet Take 1 tablet by mouth daily as needed for constipation.   spironolactone  (ALDACTONE) 25 MG tablet Take 1 tablet (25 mg total) by mouth daily.   spironolactone (ALDACTONE) 25 MG tablet Take 0.5 tablets (12.5 mg total) by mouth daily.   SYRINGE/NEEDLE, DISP, 1 ML (B-D SYRINGE/NEEDLE 1CC/25GX5/8) 25G X 5/8" 1 ML MISC 1 Units by Does not apply route every 30 (thirty) days.   vitamin A 3 MG (10000 UNITS) capsule Take 10,000 Units by mouth daily.   XARELTO 20 MG TABS tablet TAKE 1 TABLET(20 MG) BY MOUTH DAILY WITH SUPPER   zinc gluconate 50 MG tablet Take 50 mg by mouth daily.   No facility-administered encounter medications on file as of 06/19/2020.     Objective:  BP Readings from Last 3 Encounters:  05/23/20 131/85  04/24/20 126/62  04/02/20 (!) 158/74   Wt Readings from Last 3 Encounters:  05/23/20 237 lb (107.5 kg)  04/24/20 233 lb 6.4 oz (105.9 kg)  04/02/20 232 lb 6.4 oz (105.4 kg)    Goals Addressed              This Visit's Progress     Patient Stated     "I need a lift chair and my O2 concentrator delivered. I am very frustrated because I can't get through to [employer]."" (pt-stated)        CARE PLAN ENTRY (see longitudinal plan of care for additional care plan information)  Current Barriers:   Patient seeking lift chair and O2 concentrator.  Returned call to patient in response to message left at 1:32 pm today. Patient voiced appreciation for this CCM RN's effort in clarifying the O2 equipment and assisting with completion of the lift chair paperwork.   Clinical Social Work Clinical Goal(s):   Over the next 90 days, patient will work with SW to address concerns related to need for DME/lift chair  Interventions:  Provided opportunity for patient to vent her frustrations with securing needed DME of a lift chair and O2 concentrator  Assured patient this CCM RN will advocate for her in securing the equipment  Clarified with patient that per [employer] Representative Samara Deist, ML6 tanks will be delivered as she did not qualify for the  portable oxygen concentrator.   Also advised her this CCM RN will assist with MD completion of lift chair paperwork when it has been faxed to the clinic  Patient Self Care Activities:   Self administers medications as prescribed  Calls provider office for new concerns or questions  Unable to perform IADLs independently  Please see past updates related to this goal by clicking on the "Past Updates" button in the selected goal          Plan:   The care management team will reach out to the patient again over the next 30 days.    Kelli Churn RN, CCM, [facility] RN Care Manager [phone]

## 2020-06-20 DIAGNOSIS — J45909 Unspecified asthma, uncomplicated: Secondary | ICD-10-CM | POA: Diagnosis not present

## 2020-06-20 DIAGNOSIS — J449 Chronic obstructive pulmonary disease, unspecified: Secondary | ICD-10-CM | POA: Diagnosis not present

## 2020-06-20 DIAGNOSIS — R269 Unspecified abnormalities of gait and mobility: Secondary | ICD-10-CM | POA: Diagnosis not present

## 2020-06-20 DIAGNOSIS — R911 Solitary pulmonary nodule: Secondary | ICD-10-CM | POA: Diagnosis not present

## 2020-06-21 ENCOUNTER — Ambulatory Visit: Payer: Medicare Other

## 2020-06-24 ENCOUNTER — Encounter: Payer: Self-pay | Admitting: Adult Health

## 2020-06-24 ENCOUNTER — Other Ambulatory Visit: Payer: Self-pay

## 2020-06-24 ENCOUNTER — Telehealth: Payer: Self-pay

## 2020-06-24 ENCOUNTER — Ambulatory Visit (INDEPENDENT_AMBULATORY_CARE_PROVIDER_SITE_OTHER): Payer: Medicare Other | Admitting: Adult Health

## 2020-06-24 DIAGNOSIS — L98499 Non-pressure chronic ulcer of skin of other sites with unspecified severity: Secondary | ICD-10-CM | POA: Diagnosis not present

## 2020-06-24 DIAGNOSIS — J4541 Moderate persistent asthma with (acute) exacerbation: Secondary | ICD-10-CM

## 2020-06-24 DIAGNOSIS — Z7901 Long term (current) use of anticoagulants: Secondary | ICD-10-CM

## 2020-06-24 DIAGNOSIS — J9611 Chronic respiratory failure with hypoxia: Secondary | ICD-10-CM | POA: Diagnosis not present

## 2020-06-24 DIAGNOSIS — C3490 Malignant neoplasm of unspecified part of unspecified bronchus or lung: Secondary | ICD-10-CM | POA: Diagnosis not present

## 2020-06-24 MED ORDER — AZITHROMYCIN 250 MG PO TABS: ORAL_TABLET | ORAL | 0 refills | Status: AC

## 2020-06-24 NOTE — Telephone Encounter (Signed)
Patient with diagnosis of atrial fibrillation on Xarelto for anticoagulation.    Procedure: Teeth extraction, 3 teeth Date of procedure: TBD      :[id number]}  CHA2DS2-VASc Score = 5  This indicates a 7.2% annual risk of stroke. The patient's score is based upon: CHF History: 1 HTN History: 1 Diabetes History: 0 Stroke History: 0 Vascular Disease History: 1 Age Score: 1 Gender Score: 1 Of note, patient also had single PE in 2016   CrCl: 73 mL/min Platelet count 194  Per office protocol, patient can hold Xarelto for 1 day prior to procedure.  Patient will not need bridging with Lovenox (enoxaparin) around procedure.  Patient should restart Xarelto 20 mg daily on the evening of her procedure, at discretion of procedure DDS.

## 2020-06-24 NOTE — Assessment & Plan Note (Signed)
Mild flare with bronchitis  Hold on steroids .  Treat with empiric antibiotics . Suspect dental issues contributing   Plan Patient Instructions  Cardiology preop clearance for dental work.  ZPack take as directed.  Mucinex DM Twice daily  As needed  Cough/congestion  Saline nasal spray and gel Twice daily.  Albuterol Neb Twice daily.  Continue on Oxygen 2l/m with activity and At bedtime  .  Albuterol Inhaler /  Neb every 4-6 hr as needed wheezing .  Activity as tolerated.  Follow up with Dr. Daryll Drown for skin ulcer on your hip.  Continue wound care .  Continue on Xarelto.  Follow up with CT chest as planned in December .  Follow up with Dr. Julien Nordmann as planned in December.  Continue on Oxygen 2l/m with activity and At bedtime   Follow up with Dr. Elsworth Soho  In 2 months or Dhalia Zingaro NP.  Please contact office for sooner follow up if symptoms do not improve or worsen or seek emergency care

## 2020-06-24 NOTE — Assessment & Plan Note (Signed)
Left hip ulcer from burn from heating pad . -continue with wound care.  F/up with PCP for further management .

## 2020-06-24 NOTE — Assessment & Plan Note (Signed)
Advised on Xarelto compliance Follow up cards for preop clearance for dental procedure.

## 2020-06-24 NOTE — Progress Notes (Signed)
@Patient  ID: Shannon [name], female    DOB: 02/28/1956, 64 y.o.   MRN: [id number]  Chief Complaint  Patient presents with  . Follow-up    Asthma     Referring provider: [name], MD  HPI: 64 year old female never smoker followed for severe persistent chronic obstructive asthma, chronic hypoxic respiratory failure on oxygen 2 L with activity and at bedtime, allergic rhinitis , lung cancer diagnosed June 2021 Medical history significant for HIV disease followed by infectious disease, lipodystrophy, atrial flutter on chronic anticoagulation with Xarelto, stage III chronic kidney disease  TEST/EVENTS :  CT chest 01/10/2020 masslike consolidation in the left upper lobe measuring 2.7 x 1.9 x 1.9 cm  PET scan showed hypermetabolism in this lesion and also in a 9 mm left axillary lymph node and in the left thyroid nodule  10/2019 that revealed LVEF 60 to 65% with mild LVH and grade 1 diastolic dysfunction. The ascending aorta was normal in size.  Chest xray 05/2019 -cardiomegaly and vascular congestion without edema. No consolidative process, pneumothorax or pleural effusion. No acute or focal bony abnormality.  CTa chest 12/2016 , 01/2017 >neg for PE , multifocal scarring bilaterally  PFT (12/04/15) FEV1/FVC 50%, FEV1 0.64 31%.+++BD response. DLCO 66%.  Spirometry 09/2017-ratio 59, FEV1 24%, FVC 32%  Spirometry 05/2018 ratio 55, FEV1 28%, FVC 38%   Sleep study in 04/2016 was (-) for OSA  06/24/2020 Follow up : Asthma, Lung Cancer , O2 RF  Patient presents for a 84-month follow-up.  Patient has underlying severe chronic obstructive asthma.  She has significant symptom burden with shortness of breath decreased activity tolerance and intermittent cough.  She has been tried on multiple inhalers including Trelegy, Symbicort and Breo with no perceived benefit.  She remains on albuterol nebulizer twice daily and as needed.  We have discussed adding in a nebulized  bronchodilator and in held corticosteroid however patient has declined this. She says overall breathing is doing okay until last few weeks with weather change. Has sinus congestion , drainage, and productive cough for 3 weeks. No loss of taste or smell.  Covid vaccine up to date. Coughing up thick green mucus. No change in oxygen demands. No increased albuterol use. No n/v/d. Appetite is good.   Patient has chronic respiratory failure on oxygen 2 L with activity and at bedtime.  We have ordered a portable concentrator but have been unable to obtain 1 as there is a shortage due to the pandemic.  She does have portable tanks now.  And a home concentrator.  No increased O2 demands. O2 sats today in office on room air 91%.   Patient is a never smoker.  She diagnosed with lung cancer earlier this year,  adenocarcinoma (stage Ia).  She currently under the care of radiation oncology and oncology.  Previously seen by thoracic surgery but not felt to be a good surgical candidate due to her multiple comorbidities and severe airflow fixed obstruction.  Patient has completed her 5 sessions of SBRT. [name] She has a planned follow-up serial CT in 3 months.  She has oncology follow-up on August 21, 2020.   Patient is followed by infectious disease for her HIV. Remains on antivirals. No weight loss.   Has plans for lower mid teeth(x 3 )  removal . Local anestheisa only. Advised from pulmonary standpoint okay with low risk . (avoid anesthesia ) . Advised will need cardiology clearance due to cardiac issues and chronic anticoagulation.   Says she has sore  on left hip from ruptured blister after heating pad (fell asleep). Has not taken xarelto for last few days. No bleeding .      Allergies  Allergen Reactions  . Tree Extract Swelling and Other (See Comments)    Swelling to eyes  . Augmentin [Amoxicillin-Pot Clavulanate] Other (See Comments)    Headache, dizzy  . Lisinopril Cough    Face/throat swelling  .  Ciprofloxacin Hives    Immunization History  Administered Date(s) Administered  . PFIZER SARS-COV-2 Vaccination 12/01/2019, 12/27/2019    Past Medical History:  Diagnosis Date  . Anemia   . Anxiety    HX PANIC ATTACKS  . Arthritis    "starting to; in my hands" (07/09/2015)  . Asthma   . Atrial fibrillation ([place])   . Atrial flutter, paroxysmal ([place])   . Bloated abdomen   . CFS (chronic fatigue syndrome)   . Chewing difficulty   . Chronic asthma with acute exacerbation    "I have chronic asthma all the time; sometimes exacerbations" (07/09/2015)  . Chronic lower back pain   . COPD (chronic obstructive pulmonary disease) ([place])   . Cyst of right kidney    "3 of them; dx'd in ~ 01/2015"  . Dyspnea   . GERD (gastroesophageal reflux disease)   . Heart murmur   . History of blood transfusion    "related to my brain surgery I think"  . History of pulmonary embolism 07/09/2015  . HIV antibody positive ([place])   . HIV disease ([place])   . Hyperlipidemia   . Hypertension   . Leg edema   . Lipodystrophy   . Mild CAD 2013  . Multiple thyroid nodules   . Osteopenia   . Palpitations   . Pneumonia 07/09/2015  . Shingles   . Sleep apnea    "never completed part 2 of study; never wore mask" (07/09/2015)  . Vitamin B 12 deficiency   . Vitamin D deficiency     Tobacco History: Social History   Tobacco Use  Smoking Status Never Smoker  Smokeless Tobacco Never Used   Counseling given: Not Answered   Outpatient Medications Prior to Visit  Medication Sig Dispense Refill  . acetaminophen (TYLENOL) 325 MG tablet Take 325 mg by mouth every 6 (six) hours as needed (FOR PAIN).    [name] albuterol (PROVENTIL) (2.5 MG/3ML) 0.083% nebulizer solution Take 3 mLs (2.5 mg total) by nebulization every 6 (six) hours as needed for wheezing or shortness of breath. 75 mL 3  . Ascorbic Acid (VITAMIN C) 1000 MG tablet Take 1,000 mg by mouth every other day.     . b complex vitamins tablet Take 1 tablet by  mouth daily.    [name] BIKTARVY 50-200-25 MG TABS tablet TAKE 1 TABLET BY MOUTH DAILY 30 tablet 5  . BIOTIN PO Take 1 tablet by mouth daily as needed (for supplementation).     . calcium carbonate (OSCAL) 1500 (600 Ca) MG TABS tablet Take 600 mg of elemental calcium by mouth daily with breakfast.    . Cholecalciferol (SM VITAMIN D3) 100 MCG (4000 UT) CAPS Take 1 capsule (4,000 Units total) by mouth daily. 30 capsule 0  . cloNIDine (CATAPRES) 0.1 MG tablet Take 1 tablet (0.1 mg total) by mouth every 12 (twelve) hours. 180 tablet 1  . cyanocobalamin (,VITAMIN B-12,) 1000 MCG/ML injection INJECT 1 ML IN THE MUSCLE EVERY 30 DAYS 1 mL 1  . diltiazem (CARDIZEM CD) 240 MG 24 hr capsule TAKE 1 CAPSULE(240 MG) BY  MOUTH DAILY (Patient taking differently: Take 240 mg by mouth daily. ) 90 capsule 3  . fluticasone furoate-vilanterol (BREO ELLIPTA) 200-25 MCG/INH AEPB Inhale 1 puff into the lungs daily. 1 each 5  . furosemide (LASIX) 20 MG tablet Take 1 tablet (20 mg total) by mouth every other day. 30 tablet 2  . furosemide (LASIX) 20 MG tablet Take 1 tablet (20 mg total) by mouth daily. (Patient taking differently: Take 20 mg by mouth daily. As needed) 90 tablet 0  . MAGNESIUM PO Take 1 tablet by mouth every other day.     . Melatonin 5 MG TABS Take 5 mg by mouth at bedtime as needed (for sleep).    . Misc. Devices (PULSE OXIMETER FOR FINGER) MISC 1 Units by Does not apply route as needed. 1 each 0  . Mouthwashes (BIOTENE DRY MOUTH GENTLE) LIQD Use as directed 1 Dose in the mouth or throat 2 (two) times daily as needed (dry mouth).    . Omega-3 Fatty Acids (FISH OIL) 1000 MG CAPS Take 1,000 mg by mouth daily as needed (pt prefrence).    [name] omeprazole (PRILOSEC) 40 MG capsule Take 1 capsule (40 mg total) by mouth daily as needed (heart burn).    . OXYGEN Inhale 1.5-2 L into the lungs See admin instructions. Uses at bedtime and through the day as needed    . polyethylene glycol (MIRALAX / GLYCOLAX) 17 g packet Take 17  g by mouth daily as needed for mild constipation.    [name] PREVIDENT 5000 SENSITIVE 1.1-5 % PSTE Use daily as directed  1  . PROAIR HFA 108 (90 Base) MCG/ACT inhaler Inhale 2 puffs into the lungs every 4 (four) hours as needed for wheezing or shortness of breath. INHALE 2 PUFFS INTO THE LUNGS EVERY 4 HOURS AS NEEDED FOR WHEEZING OR SHORTNESS OF BREATH    . senna (SENOKOT) 8.6 MG tablet Take 1 tablet by mouth daily as needed for constipation.    [name] spironolactone (ALDACTONE) 25 MG tablet Take 0.5 tablets (12.5 mg total) by mouth daily. 90 tablet 1  . SYRINGE/NEEDLE, DISP, 1 ML (B-D SYRINGE/NEEDLE 1CC/25GX5/8) 25G X 5/8" 1 ML MISC 1 Units by Does not apply route every 30 (thirty) days. 50 each 0  . vitamin A 3 MG (10000 UNITS) capsule Take 10,000 Units by mouth daily.    Alveda Reasons 20 MG TABS tablet TAKE 1 TABLET(20 MG) BY MOUTH DAILY WITH SUPPER 90 tablet 0  . zinc gluconate 50 MG tablet Take 50 mg by mouth daily.    [name] doxycycline (VIBRA-TABS) 100 MG tablet Take 1 tablet (100 mg total) by mouth 2 (two) times daily. (Patient not taking: Reported on 06/24/2020) 14 tablet 0  . spironolactone (ALDACTONE) 25 MG tablet Take 1 tablet (25 mg total) by mouth daily. 90 tablet 1  . cyanocobalamin (,VITAMIN B-12,) 1000 MCG/ML injection ADMINISTER 1 ML(1000 MCG) IN THE MUSCLE EVERY 30 DAYS 1 mL 0   No facility-administered medications prior to visit.     Review of Systems:   Constitutional:   No  weight loss, night sweats,  Fevers, chills, + fatigue, or  lassitude.  HEENT:   No headaches,  Difficulty swallowing,  Tooth/dental problems, or  Sore throat,                No sneezing, itching, ear ache,  +nasal congestion, post nasal drip,   CV:  No chest pain,  Orthopnea, PND, swelling in lower extremities, anasarca, dizziness, palpitations, syncope.  GI  No heartburn, indigestion, abdominal pain, nausea, vomiting, diarrhea, change in bowel habits, loss of appetite, bloody stools.   Resp:  No chest wall  deformity  Skin: no rash or lesions.  GU: no dysuria, change in color of urine, no urgency or frequency.  No flank pain, no hematuria   MS:  No joint pain or swelling.  No decreased range of motion.  No back pain.    Physical Exam  BP (!) 142/62   Pulse 97   Temp 98 F (36.7 C)   Ht 5\' 4"  (1.626 m)   Wt 239 lb 3.2 oz (108.5 kg)   SpO2 91%   BMI 41.06 kg/m   GEN: A/Ox3; pleasant , NAD, well nourished    HEENT:  [place]/AT,   NOSE-clear, THROAT-clear, no lesions, no postnasal drip or exudate noted. Maxillary sinus tenderness   NECK:  Supple w/ fair ROM; no JVD; normal carotid impulses w/o bruits; no thyromegaly or nodules palpated; no lymphadenopathy.    RESP  Clear  P & A; w/o, wheezes/ rales/ or rhonchi. no accessory muscle use, no dullness to percussion  CARD:  RRR, no m/r/g, no peripheral edema, pulses intact, no cyanosis or clubbing.  GI:   Soft & nt; nml bowel sounds; no organomegaly or masses detected.   Musco: Warm bil, no deformities or joint swelling noted.   Neuro: alert, no focal deficits noted.    Skin: Warm, circular lesion along lateral left hip with no redness, or streaking , granulomatous base.     Lab Results:  CBC   ProBNP No results found for: PROBNP  Imaging: No results found.    PFT Results Latest Ref Rng & Units 12/04/2015  FVC-Predicted Pre % 49  FVC-Post L 1.45  FVC-Predicted Post % 55  Pre FEV1/FVC % % 50  Post FEV1/FCV % % 55  FEV1-Pre L 0.64  FEV1-Predicted Pre % 31  FEV1-Post L 0.80  DLCO uncorrected ml/min/mmHg 16.04  DLCO UNC% % 66  DLCO corrected ml/min/mmHg 16.29  DLCO COR %Predicted % 67  DLVA Predicted % 109    No results found for: NITRICOXIDE      Assessment & Plan:   Moderate persistent asthma with exacerbation Mild flare with bronchitis  Hold on steroids .  Treat with empiric antibiotics . Suspect dental issues contributing   Plan Patient Instructions  Cardiology preop clearance for dental work.    ZPack take as directed.  Mucinex DM Twice daily  As needed  Cough/congestion  Saline nasal spray and gel Twice daily.  Albuterol Neb Twice daily.  Continue on Oxygen 2l/m with activity and At bedtime  .  Albuterol Inhaler /  Neb every 4-6 hr as needed wheezing .  Activity as tolerated.  Follow up with Dr. Daryll Drown for skin ulcer on your hip.  Continue wound care .  Continue on Xarelto.  Follow up with CT chest as planned in December .  Follow up with Dr. Julien Nordmann as planned in December.  Continue on Oxygen 2l/m with activity and At bedtime   Follow up with Dr. Elsworth Soho  In 2 months or Mareo Portilla NP.  Please contact office for sooner follow up if symptoms do not improve or worsen or seek emergency care        Chronic respiratory failure with hypoxia ([place]) Continue on O2 with activity and At bedtime    Adenocarcinoma of lung (HCC) S/p SBRT x 5 treatments. Continue with serial CT follow up as planned next month with f/up  with Oncology Radiation and Oncology .    Chronic anticoagulation Advised on Xarelto compliance Follow up cards for preop clearance for dental procedure.   Skin ulcer ([place]) Left hip ulcer from burn from heating pad . -continue with wound care.  F/up with PCP for further management .      Rexene Edison, NP 06/24/2020

## 2020-06-24 NOTE — Assessment & Plan Note (Signed)
S/p SBRT x 5 treatments. Continue with serial CT follow up as planned next month with f/up with Oncology Radiation and Oncology .

## 2020-06-24 NOTE — Assessment & Plan Note (Signed)
Continue on O2 with activity and At bedtime

## 2020-06-24 NOTE — Patient Instructions (Addendum)
Cardiology preop clearance for dental work.  ZPack take as directed.  Mucinex DM Twice daily  As needed  Cough/congestion  Saline nasal spray and gel Twice daily.  Albuterol Neb Twice daily.  Continue on Oxygen 2l/m with activity and At bedtime  .  Albuterol Inhaler /  Neb every 4-6 hr as needed wheezing .  Activity as tolerated.  Follow up with Dr. Daryll Drown for skin ulcer on your hip.  Continue wound care .  Continue on Xarelto.  Follow up with CT chest as planned in December .  Follow up with Dr. Julien Nordmann as planned in December.  Continue on Oxygen 2l/m with activity and At bedtime   Follow up with Dr. Elsworth Soho  In 2 months or Shirlie Enck NP.  Please contact office for sooner follow up if symptoms do not improve or worsen or seek emergency care

## 2020-06-24 NOTE — Telephone Encounter (Signed)
Please include preop clearance as part of the reason for upcoming visit with Dr. Oval Linsey

## 2020-06-24 NOTE — Telephone Encounter (Signed)
   [facility] Pre-operative Risk Assessment    HEARTCARE STAFF: - Please ensure there is not already an duplicate clearance open for this procedure. - Under Visit Info/Reason for Call, type in Other and utilize the format Clearance MM/DD/YY or Clearance TBD. Do not use dashes or single digits. - If request is for dental extraction, please clarify the # of teeth to be extracted.  Request for surgical clearance:  1. What type of surgery is being performed? Teeth extraction- 3 teeth   2. When is this surgery scheduled? TBD    3. What type of clearance is required (medical clearance vs. Pharmacy clearance to hold med vs. Both)? Both  4. Are there any medications that need to be held prior to surgery and how long? Xarelto    5. Practice name and name of physician performing surgery? Merry Proud, DDS- Darel Hong, DDS  6. What is the office phone number? [phone]   7.   What is the office fax number? [phone]  8.   Anesthesia type (None, local, MAC, general) ? Local anesthetic with Epinephrine    Shannon [name] 06/24/2020, 3:27 PM  _________________________________________________________________   (provider comments below)

## 2020-06-25 ENCOUNTER — Ambulatory Visit: Payer: Medicare Other

## 2020-06-25 DIAGNOSIS — I5032 Chronic diastolic (congestive) heart failure: Secondary | ICD-10-CM

## 2020-06-25 DIAGNOSIS — I10 Essential (primary) hypertension: Secondary | ICD-10-CM

## 2020-06-25 DIAGNOSIS — N183 Chronic kidney disease, stage 3 unspecified: Secondary | ICD-10-CM

## 2020-06-25 NOTE — Progress Notes (Signed)
Internal Medicine Clinic Resident  I have personally reviewed this encounter including the documentation in this note and/or discussed this patient with the care management provider. I will address any urgent items identified by the care management provider and will communicate my actions to the patient's PCP. I have reviewed the patient's CCM visit with my supervising attending, Dr Jimmye Norman.  Asencion Noble, MD 06/25/2020

## 2020-06-25 NOTE — Chronic Care Management (AMB) (Signed)
  Care Management   Follow Up Note   06/25/2020 Name: AUDRY KAUZLARICH MRN: [id number] DOB: 1956-07-01  Referred by: [name], MD Reason for referral : Care Coordination (DME, Medical Clearance for Dental Treatment)   ABIA MONACO is a 64 y.o. year old female who is a primary care patient of [name], MD. The care management team was consulted for assistance with care management and care coordination needs.    Review of patient status, including review of consultants reports, relevant laboratory and other test results, and collaboration with appropriate care team members and the patient's provider was performed as part of comprehensive patient evaluation and provision of chronic care management services.    SDOH (Social Determinants of Health) assessments performed: No See Care Plan activities for detailed interventions related to [facility])     Advanced Directives: See Care Plan and Vynca application for related entries.   Goals Addressed              This Visit's Progress   .  "I need a lift chair and my O2 concentrator delivered. I am very frustrated because I can't get through to [employer]."" (pt-stated)        CARE PLAN ENTRY (see longitudinal plan of care for additional care plan information)  Current Barriers:  . Patient seeking lift chair and O2 concentrator.  Returned call to patient in response to message left at 1:32 pm today. Patient voiced appreciation for this CCM RN's effort in clarifying the O2 equipment and assisting with completion of the lift chair paperwork.   Clinical Social Work Clinical Goal(s):  [name] Over the next 90 days, patient will work with SW to address concerns related to need for DME/lift chair  Interventions: . In basket message sent to Dr. Gilles Chiquito requesting completion of Certificate of Medical Necessity for lift chair . Placed documentation in Dr. Doristine Section box . Received confirmation that she will complete  Patient Self Care  Activities:  . Self administers medications as prescribed . Calls provider office for new concerns or questions . Unable to perform IADLs independently  Please see past updates related to this goal by clicking on the "Past Updates" button in the selected goal      .  Patient scheduled for dental treatment (pt-stated)        CARE PLAN ENTRY (see longitudinal plan of care for additional care plan information)  Current Barriers:  . Patient scheduled for dental treatment and needs medical clearance  Clinical Social Work Clinical Goal(s):  [name] Over the next 14 days, patient will work with SW for medical clearance to receive dental treatment  Interventions: . Inter-disciplinary care team collaboration (see longitudinal plan of care) . Medical Clearance for Dental Treatment forwarded to provider for completion/signature  Patient Self Care Activities:  . Self administers medications as prescribed . Attends all scheduled provider appointments . Calls provider office for new concerns or questions . Unable to perform IADLs independently  Initial goal documentation         The patient has been provided with contact information for the care management team and has been advised to call with any health related questions or concerns.    Ronn Melena, [place] Coordination Social Worker [place] [phone]

## 2020-06-25 NOTE — Patient Instructions (Signed)
Visit Information  Goals Addressed              This Visit's Progress   .  "I need a lift chair and my O2 concentrator delivered. I am very frustrated because I can't get through to [employer]."" (pt-stated)        CARE PLAN ENTRY (see longitudinal plan of care for additional care plan information)  Current Barriers:  . Patient seeking lift chair and O2 concentrator.  Returned call to patient in response to message left at 1:32 pm today. Patient voiced appreciation for this CCM RN's effort in clarifying the O2 equipment and assisting with completion of the lift chair paperwork.   Clinical Social Work Clinical Goal(s):  [name] Over the next 90 days, patient will work with SW to address concerns related to need for DME/lift chair  Interventions: . In basket message sent to Dr. Gilles Chiquito requesting completion of Certificate of Medical Necessity for lift chair . Placed documentation in Dr. Doristine Section box . Received confirmation that she will complete  Patient Self Care Activities:  . Self administers medications as prescribed . Calls provider office for new concerns or questions . Unable to perform IADLs independently  Please see past updates related to this goal by clicking on the "Past Updates" button in the selected goal      .  Patient scheduled for dental treatment (pt-stated)        CARE PLAN ENTRY (see longitudinal plan of care for additional care plan information)  Current Barriers:  . Patient scheduled for dental treatment and needs medical clearance  Clinical Social Work Clinical Goal(s):  [name] Over the next 14 days, patient will work with SW for medical clearance to receive dental treatment  Interventions: . Inter-disciplinary care team collaboration (see longitudinal plan of care) . Medical Clearance for Dental Treatment forwarded to provider for completion/signature  Patient Self Care Activities:  . Self administers medications as prescribed . Attends all scheduled  provider appointments . Calls provider office for new concerns or questions . Unable to perform IADLs independently  Initial goal documentation          The patient has been provided with contact information for the care management team and has been advised to call with any health related questions or concerns.       Ronn Melena, [place] Coordination Social Worker [place] [phone]

## 2020-06-25 NOTE — Telephone Encounter (Signed)
Updated upcoming office appointment note to include "Pre-Op clearance"

## 2020-06-25 NOTE — Progress Notes (Signed)
Internal Medicine Clinic Attending  CCM services provided by the care management provider and their documentation were discussed with Dr. Sherry Ruffing. We reviewed the pertinent findings, urgent action items addressed by the resident and non-urgent items to be addressed by the PCP.  I agree with the assessment, diagnosis, and plan of care documented in the CCM and resident's note.  Gilles Chiquito, MD 06/25/2020

## 2020-06-26 ENCOUNTER — Telehealth: Payer: Self-pay

## 2020-06-26 NOTE — Telephone Encounter (Signed)
Returned patient's call at 1045 am and 1147 am. Voice mail left that patient needs to ask her primary MD to fill out her dental form and request for a seat lift per Dr. Sondra Come.

## 2020-06-27 ENCOUNTER — Ambulatory Visit: Payer: Medicare Other

## 2020-06-27 ENCOUNTER — Ambulatory Visit: Payer: Medicare Other | Admitting: *Deleted

## 2020-06-27 DIAGNOSIS — I5032 Chronic diastolic (congestive) heart failure: Secondary | ICD-10-CM

## 2020-06-27 DIAGNOSIS — I2510 Atherosclerotic heart disease of native coronary artery without angina pectoris: Secondary | ICD-10-CM

## 2020-06-27 DIAGNOSIS — J449 Chronic obstructive pulmonary disease, unspecified: Secondary | ICD-10-CM

## 2020-06-27 DIAGNOSIS — C3492 Malignant neoplasm of unspecified part of left bronchus or lung: Secondary | ICD-10-CM

## 2020-06-27 DIAGNOSIS — N183 Chronic kidney disease, stage 3 unspecified: Secondary | ICD-10-CM

## 2020-06-27 DIAGNOSIS — I10 Essential (primary) hypertension: Secondary | ICD-10-CM

## 2020-06-27 DIAGNOSIS — J4489 Other specified chronic obstructive pulmonary disease: Secondary | ICD-10-CM

## 2020-06-27 DIAGNOSIS — B20 Human immunodeficiency virus [HIV] disease: Secondary | ICD-10-CM

## 2020-06-27 NOTE — Chronic Care Management (AMB) (Signed)
Care Management   Follow Up Note   06/27/2020 Name: Shannon [name] MRN: [id number] DOB: 04-Aug-1956  Referred by: [name], MD Reason for referral : Care Coordination (DME)   Shannon [name] is a 64 y.o. year old female who is a primary care patient of [name], MD. The care management team was consulted for assistance with care management and care coordination needs.    Review of patient status, including review of consultants reports, relevant laboratory and other test results, and collaboration with appropriate care team members and the patient's provider was performed as part of comprehensive patient evaluation and provision of chronic care management services.    SDOH (Social Determinants of Health) assessments performed: No See Care Plan activities for detailed interventions related to [facility])     Advanced Directives: See Care Plan and Vynca application for related entries.   Goals Addressed              This Visit's Progress   .  "I need a lift chair and my O2 concentrator delivered. I am very frustrated because I can't get through to [employer]."" (pt-stated)        CARE PLAN ENTRY (see longitudinal plan of care for additional care plan information)  Current Barriers:  . Patient seeking lift chair and O2 concentrator.  Returned call to patient in response to message left at 1:32 pm today. Patient voiced appreciation for this CCM RN's effort in clarifying the O2 equipment and assisting with completion of the lift chair paperwork.   Clinical Social Work Clinical Goal(s):  [name] Over the next 90 days, patient will work with SW to address concerns related to need for DME/lift chair  Interventions: . Received signed Certificate of Medical Necessity from Dr. Gilles Chiquito, however, Section B was incomplete . Collaborated with RNCM, Kelli Churn for completion of Section B . [place] to obtain fax number in which to send certificate . Certificate faxed to  [phone] . Community Message sent to [place] team to inform that certificate was faxed . Fax confirmation received.  .  Patient Self Care Activities:  . Self administers medications as prescribed . Calls provider office for new concerns or questions . Unable to perform IADLs independently  Please see past updates related to this goal by clicking on the "Past Updates" button in the selected goal      .  Patient scheduled for dental treatment (pt-stated)        CARE PLAN ENTRY (see longitudinal plan of care for additional care plan information)  Current Barriers:  . Patient scheduled for dental treatment and needs medical clearance  Clinical Social Work Clinical Goal(s):  [name] Over the next 14 days, patient will work with SW for medical clearance to receive dental treatment  Interventions: . Inter-disciplinary care team collaboration (see longitudinal plan of care) . Received signed Medical Clearance for Dental Treatment from Dr. Gilles Chiquito, however, for was incomplete . In basket message sent to Dr. Daryll Drown requesting completion of form . Form placed in Dr. Doristine Section box . Collaborated with RNCM, Kelli Churn.   Patient Self Care Activities:  . Self administers medications as prescribed . Attends all scheduled provider appointments . Calls provider office for new concerns or questions . Unable to perform IADLs independently  Initial goal documentation         The patient has been provided with contact information for the care management team and has been advised to call with any health  related questions or concerns.     Ronn Melena, [place] Coordination Social Worker [place] [phone]

## 2020-06-27 NOTE — Chronic Care Management (AMB) (Signed)
  Chronic Care Management   Note  06/27/2020 Name: Shannon [name] MRN: [id number] DOB: October 14, 1955   Per request from [place], completed section B of certificate of medical necessity for lift chair motor.  Follow up plan: Telephone follow up appointment with care management team member scheduled for:07/10/20 at 9:15 am  Kelli Churn RN, CCM, [facility] RN Care Manager [phone]

## 2020-06-27 NOTE — Patient Instructions (Signed)
Visit Information  Goals Addressed              This Visit's Progress   .  "I need a lift chair and my O2 concentrator delivered. I am very frustrated because I can't get through to [employer]."" (pt-stated)        CARE PLAN ENTRY (see longitudinal plan of care for additional care plan information)  Current Barriers:  . Patient seeking lift chair and O2 concentrator.  Returned call to patient in response to message left at 1:32 pm today. Patient voiced appreciation for this CCM RN's effort in clarifying the O2 equipment and assisting with completion of the lift chair paperwork.   Clinical Social Work Clinical Goal(s):  [name] Over the next 90 days, patient will work with SW to address concerns related to need for DME/lift chair  Interventions: . Received signed Certificate of Medical Necessity from Dr. Gilles Chiquito, however, Section B was incomplete . Collaborated with RNCM, Kelli Churn for completion of Section B . [place] to obtain fax number in which to send certificate . Certificate faxed to [phone] . Community Message sent to [place] team to inform that certificate was faxed . Fax confirmation received.  .  Patient Self Care Activities:  . Self administers medications as prescribed . Calls provider office for new concerns or questions . Unable to perform IADLs independently  Please see past updates related to this goal by clicking on the "Past Updates" button in the selected goal      .  Patient scheduled for dental treatment (pt-stated)        CARE PLAN ENTRY (see longitudinal plan of care for additional care plan information)  Current Barriers:  . Patient scheduled for dental treatment and needs medical clearance  Clinical Social Work Clinical Goal(s):  [name] Over the next 14 days, patient will work with SW for medical clearance to receive dental treatment  Interventions: . Inter-disciplinary care team collaboration (see longitudinal plan of  care) . Received signed Medical Clearance for Dental Treatment from Dr. Gilles Chiquito, however, for was incomplete . In basket message sent to Dr. Daryll Drown requesting completion of form . Form placed in Dr. Doristine Section box . Collaborated with RNCM, Kelli Churn.   Patient Self Care Activities:  . Self administers medications as prescribed . Attends all scheduled provider appointments . Calls provider office for new concerns or questions . Unable to perform IADLs independently  Initial goal documentation          The patient has been provided with contact information for the care management team and has been advised to call with any health related questions or concerns.    Ronn Melena, [place] Coordination Social Worker [place] [phone]

## 2020-06-27 NOTE — Progress Notes (Signed)
Internal Medicine Clinic Resident  I have personally reviewed this encounter including the documentation in this note and/or discussed this patient with the care management provider. I will address any urgent items identified by the care management provider and will communicate my actions to the patient's PCP. I have reviewed the patient's CCM visit with my supervising attending, Dr Heber [place].  Virl Axe, MD 06/27/2020

## 2020-06-28 ENCOUNTER — Ambulatory Visit (INDEPENDENT_AMBULATORY_CARE_PROVIDER_SITE_OTHER): Payer: Medicare Other | Admitting: Internal Medicine

## 2020-06-28 DIAGNOSIS — K5904 Chronic idiopathic constipation: Secondary | ICD-10-CM | POA: Diagnosis not present

## 2020-06-28 MED ORDER — POLYETHYLENE GLYCOL 3350 17 G PO PACK: 17.0000 g | PACK | Freq: Every day | ORAL | Status: DC | PRN

## 2020-06-28 NOTE — Assessment & Plan Note (Signed)
This is a chronic and uncontrolled issue.  She is having severe constipation, possibly due to her medications and possible due to inability to take in enough fiber.  We discussed taking fiber daily (gummy or powder in water) and Miralax daily until having regular bowel movements.  She will also take MOM if no bowel movement in a weeks time (will take today).  She will call me back with an update next week.

## 2020-06-28 NOTE — Progress Notes (Signed)
[facility] Internal Medicine Residency Telephone Encounter Continuity Care Appointment  HPI:   This telephone encounter was created for [name] on 06/28/2020 for the following purpose/cc Abdominal pain.  Shannon [name] reports that her teeth are very loose.  Last week she went to the dentist and was told that they have to come out.  She is supposed to get medical clearance for this, Cardiology and Internal Medicine.  She sent the letter to [facility]. The teeth are shifted and loose.  She feels she might be eating the wrong thing due to this. She is not able to bite things correctly.  She only has 7 bottom teeth.  She also sent the paperwork to [employer] or Marcie Bal here at [facility].    She is still getting nauseous after her XRT treatments.  She is no longer getting it, but the nausea has persisted.  She is due for a colonoscopy and she feels she needs to see her gastroenterologist.   She is very positive about making an appointment for this.   She does feel like she is constipated due to medications and her diet.  She eliminated carbs and sugar.   She notes that it has been over 2 weeks since her last BM.  She only has small pellet like bowel movements every other day.  She feels that she is not moving medications out of her body due to being constipated.  MOM does move her bowels and she is planning to take this today.   She does sometimes take a stool softener or a dulcolax that she gets over the counter.  She feels like she takes a lot of water in.    She just took a course of Azithromycin for sinusitis and congestion in her chest. She is getting a booster shot for COVID on 08/04/20.     Past Medical History:  Past Medical History:  Diagnosis Date  . Anemia   . Anxiety    HX PANIC ATTACKS  . Arthritis    "starting to; in my hands" (07/09/2015)  . Asthma   . Atrial fibrillation ([place])   . Atrial flutter, paroxysmal ([place])   . Bloated abdomen   . CFS (chronic fatigue syndrome)   .  Chewing difficulty   . Chronic asthma with acute exacerbation    "I have chronic asthma all the time; sometimes exacerbations" (07/09/2015)  . Chronic lower back pain   . COPD (chronic obstructive pulmonary disease) ([place])   . Cyst of right kidney    "3 of them; dx'd in ~ 01/2015"  . Dyspnea   . GERD (gastroesophageal reflux disease)   . Heart murmur   . History of blood transfusion    "related to my brain surgery I think"  . History of pulmonary embolism 07/09/2015  . HIV antibody positive ([place])   . HIV disease ([place])   . Hyperlipidemia   . Hypertension   . Leg edema   . Lipodystrophy   . Mild CAD 2013  . Multiple thyroid nodules   . Osteopenia   . Palpitations   . Pneumonia 07/09/2015  . Shingles   . Sleep apnea    "never completed part 2 of study; never wore mask" (07/09/2015)  . Vitamin B 12 deficiency   . Vitamin D deficiency       ROS:   Abdominal pain, constipation, bloating.    Assessment / Plan / Recommendations:   Please see A&P under problem oriented charting for assessment of  the patient's acute and chronic medical conditions.   As always, pt is advised that if symptoms worsen or new symptoms arise, they should go to an urgent care facility or to to ER for further evaluation.   Consent and Medical Decision Making:   This is a telephone encounter between Shannon [name] and Gilles Chiquito on 06/28/2020 for constipation. The visit was conducted with the patient located at home and Gilles Chiquito at [facility]. The patient's identity was confirmed using their DOB and current address. The patient has consented to being evaluated through a telephone encounter and understands the associated risks (an examination cannot be done and the patient may need to come in for an appointment) / benefits (allows the patient to remain at home, decreasing exposure to coronavirus). I personally spent 15 minutes on medical discussion.     Return as scheduled.  Call in 1 week to see how she is  doing.

## 2020-07-01 ENCOUNTER — Ambulatory Visit: Payer: Medicare Other | Admitting: Cardiovascular Disease

## 2020-07-02 ENCOUNTER — Ambulatory Visit: Payer: Medicare Other

## 2020-07-02 DIAGNOSIS — I5032 Chronic diastolic (congestive) heart failure: Secondary | ICD-10-CM

## 2020-07-02 DIAGNOSIS — N183 Chronic kidney disease, stage 3 unspecified: Secondary | ICD-10-CM

## 2020-07-02 DIAGNOSIS — I10 Essential (primary) hypertension: Secondary | ICD-10-CM

## 2020-07-02 NOTE — Patient Instructions (Signed)
Visit Information  Goals Addressed              This Visit's Progress   .  "I need a lift chair and my O2 concentrator delivered. I am very frustrated because I can't get through to [employer]."" (pt-stated)        CARE PLAN ENTRY (see longitudinal plan of care for additional care plan information)  Current Barriers:  . Patient seeking lift chair and O2 concentrator.  Returned call to patient in response to message left at 1:32 pm today. Patient voiced appreciation for this CCM RN's effort in clarifying the O2 equipment and assisting with completion of the lift chair paperwork.   Clinical Social Work Clinical Goal(s):  [name] Over the next 90 days, patient will work with SW to address concerns related to need for DME/lift chair  Interventions: . Community Message sent to [place] team requesting contact information for follow up on lift mechanism as Certificate of Medical Necessity was faxed on 06/27/20 . [place] number listed on certificate ([phone]) for follow up . Placed on hold by representative while he contacted supplier of mechanism in [facility] . [employer] representative stated that he was not able to get anyone on the line at the [facility] location but would send a message requesting someone contact CCM BSW for follow up . Called alternate [employer] phone number [phone]; representative unable to help . Called patient to inquire if she has direct contact information for [employer] Representative that provided her with Certificate of Medical Necessity; patient states that representatives name is Jacqlyn Larsen but she does not have a different number than one already contacted . [employer] sent to [employer] team requesting assistance with follow up    Patient Self Care Activities:  . Self administers medications as prescribed . Calls provider office for new concerns or questions . Unable to perform IADLs independently  Please see past updates related to  this goal by clicking on the "Past Updates" button in the selected goal      .  Patient scheduled for dental treatment (pt-stated)        CARE PLAN ENTRY (see longitudinal plan of care for additional care plan information)  Current Barriers:  . Patient scheduled for dental treatment and needs medical clearance  Clinical Social Work Clinical Goal(s):  [name] Over the next 14 days, patient will work with SW for medical clearance to receive dental treatment  Interventions: . Inter-disciplinary care team collaboration (see longitudinal plan of care) . In-basket message sent to Dr. Gilles Chiquito as patient is inquiring about Medical Clearance for Dental Treatment form.  . Received communication from Dr. Daryll Drown that she will attempt to get form completed this week .  [name] Patient Self Care Activities:  . Self administers medications as prescribed . Attends all scheduled provider appointments . Calls provider office for new concerns or questions . Unable to perform IADLs independently  Please see past updates related to this goal by clicking on the "Past Updates" button in the selected goal         Patient verbalizes understanding of instructions provided today.   The patient has been provided with contact information for the care management team and has been advised to call with any health related questions or concerns.   Ronn Melena, [place] Coordination Social Worker [place] [phone]

## 2020-07-02 NOTE — Progress Notes (Signed)
Internal Medicine Clinic Resident  I have personally reviewed this encounter including the documentation in this note and/or discussed this patient with the care management provider. I will address any urgent items identified by the care management provider and will communicate my actions to the patient's PCP. I have reviewed the patient's CCM visit with my supervising attending, Dr Rebeca Alert.  Dewayne Hatch, MD 07/02/2020

## 2020-07-02 NOTE — Chronic Care Management (AMB) (Signed)
Care Management   Follow Up Note   07/02/2020 Name: Shannon [name] MRN: [id number] DOB: 12-25-1955  Referred by: Shannon Falcon, MD Reason for referral : Care Coordination (DME)   Shannon [name] is a 64 y.o. year old female who is a primary care patient of Shannon Falcon, MD. The care management team was consulted for assistance with care management and care coordination needs.    Review of patient status, including review of consultants reports, relevant laboratory and other test results, and collaboration with appropriate care team members and the patient's provider was performed as part of comprehensive patient evaluation and provision of chronic care management services.    SDOH (Social Determinants of Health) assessments performed: No See Care Plan activities for detailed interventions related to [facility])     Advanced Directives: See Care Plan and Vynca application for related entries.   Goals Addressed              This Visit's Progress   .  "I need a lift chair and my O2 concentrator delivered. I am very frustrated because I can't get through to [employer]."" (pt-stated)        CARE PLAN ENTRY (see longitudinal plan of care for additional care plan information)  Current Barriers:  . Patient seeking lift chair and O2 concentrator.  Returned call to patient in response to message left at 1:32 pm today. Patient voiced appreciation for this CCM RN's effort in clarifying the O2 equipment and assisting with completion of the lift chair paperwork.   Clinical Social Work Clinical Goal(s):  Shannon [name] Over the next 90 days, patient will work with SW to address concerns related to need for DME/lift chair  Interventions: . Community Message sent to [place] team requesting contact information for follow up on lift mechanism as Certificate of Medical Necessity was faxed on 06/27/20 . [place] number listed on certificate ([phone]) for follow up . Placed on hold by  representative while he contacted supplier of mechanism in [facility] . [employer] representative stated that he was not able to get anyone on the line at the [facility] location but would send a message requesting someone contact CCM BSW for follow up . Called alternate [employer] phone number [phone]; representative unable to help . Called patient to inquire if she has direct contact information for [employer] Representative that provided her with Certificate of Medical Necessity; patient states that representatives name is Jacqlyn Larsen but she does not have a different number than one already contacted . [employer] sent to [employer] team requesting assistance with follow up    Patient Self Care Activities:  . Self administers medications as prescribed . Calls provider office for new concerns or questions . Unable to perform IADLs independently  Please see past updates related to this goal by clicking on the "Past Updates" button in the selected goal      .  Patient scheduled for dental treatment (pt-stated)        CARE PLAN ENTRY (see longitudinal plan of care for additional care plan information)  Current Barriers:  . Patient scheduled for dental treatment and needs medical clearance  Clinical Social Work Clinical Goal(s):  Shannon [name] Over the next 14 days, patient will work with SW for medical clearance to receive dental treatment  Interventions: . Inter-disciplinary care team collaboration (see longitudinal plan of care) . In-basket message sent to Dr. Gilles Chiquito as patient is inquiring about Medical Clearance for Dental Treatment form.  Shannon [name]  Received communication from Dr. Daryll [name] that she will attempt to get form completed this week .  Shannon [name] Patient Self Care Activities:  . Self administers medications as prescribed . Attends all scheduled provider appointments . Calls provider office for new concerns or questions . Unable to perform IADLs independently  Please see past updates  related to this goal by clicking on the "Past Updates" button in the selected goal          The patient has been provided with contact information for the care management team and has been advised to call with any health related questions or concerns.     Shannon [name], [place] Coordination Social Worker [place] [phone]

## 2020-07-03 ENCOUNTER — Ambulatory Visit (INDEPENDENT_AMBULATORY_CARE_PROVIDER_SITE_OTHER): Payer: Medicare Other | Admitting: Cardiovascular Disease

## 2020-07-03 ENCOUNTER — Encounter: Payer: Self-pay | Admitting: Cardiovascular Disease

## 2020-07-03 ENCOUNTER — Encounter: Payer: Self-pay | Admitting: *Deleted

## 2020-07-03 ENCOUNTER — Ambulatory Visit: Payer: Medicare Other | Admitting: Cardiovascular Disease

## 2020-07-03 VITALS — BP 135/78 | HR 103 | Temp 98.4°F | Ht 63.0 in | Wt 238.8 lb

## 2020-07-03 DIAGNOSIS — I10 Essential (primary) hypertension: Secondary | ICD-10-CM | POA: Diagnosis not present

## 2020-07-03 DIAGNOSIS — I5032 Chronic diastolic (congestive) heart failure: Secondary | ICD-10-CM | POA: Diagnosis not present

## 2020-07-03 DIAGNOSIS — I2510 Atherosclerotic heart disease of native coronary artery without angina pectoris: Secondary | ICD-10-CM | POA: Diagnosis not present

## 2020-07-03 DIAGNOSIS — I77810 Thoracic aortic ectasia: Secondary | ICD-10-CM | POA: Diagnosis not present

## 2020-07-03 NOTE — Patient Instructions (Signed)
Medication Instructions:  Your physician recommends that you continue on your current medications as directed. Please refer to the Current Medication list given to you today.  *If you need a refill on your cardiac medications before your next appointment, please call your pharmacy*   Lab Work: NONE  Testing/Procedures: NONE   Follow-Up: At [facility], you and your health needs are our priority.  As part of our continuing mission to provide you with exceptional heart care, we have created designated Provider Care Teams.  These Care Teams include your primary Cardiologist (physician) and Advanced Practice Providers (APPs -  Physician Assistants and Nurse Practitioners) who all work together to provide you with the care you need, when you need it.  We recommend signing up for the patient portal called "MyChart".  Sign up information is provided on this After Visit Summary.  MyChart is used to connect with patients for Virtual Visits (Telemedicine).  Patients are able to view lab/test results, encounter notes, upcoming appointments, etc.  Non-urgent messages can be sent to your provider as well.   To learn more about what you can do with MyChart, go to [email or web address].    Your next appointment:   3 month(s)  The format for your next appointment:   In Person  Provider:   You may see Skeet Latch, MD or one of the following Advanced Practice Providers on your designated Care Team:    [name], PA-C  [place], [place]  Coletta Memos, [place]  Other Instructions YOU ARE CLEARED FOR YOUR DENTAL EXTRACTION WITHOUT ANESTHESIA   OK TO [place] 3 DAYS PRIOR

## 2020-07-03 NOTE — Progress Notes (Signed)
Cardiology Office Note    Evaluation Performed:  Follow-up visit  Date:  07/24/2020   ID:  Shannon [name], DOB 01-15-1956, MRN [id number]  Patient Location:  Home  Provider location:   Office  PCP:  [name], MD  Cardiologist:  Skeet Latch, MD  Electrophysiologist:  None   Chief Complaint:  hypertension  History of Present Illness:    Shannon [name] is a 64 y.o. female with stage IA (T1c, N0, M0) non-small cell lung cancer starting XRT, hypertension, paroxysmal atrial flutter, asthma, medication non-compliance, non-obstructive CAD, OSA, prior PE, moderate pulmonary hypertension, mild ascending aorta aneurysm, PAD, and HIV (well-controlled) who presents via video conferencing for a telehealth visit today. Shannon [name] was initially referred by Shannon Basques, MD, on 12/26/15. [name] was concerned about her lower extremity edema and shortness of breath. She was started on lasix 20 mg every other day. [name] noted shortness of breath after a hospitalization for pneumonia 06/2015. She denied lower extremity edema or orthopnea but did report chest pain when laying down at night. She was referred for an echo 01/2016 that revealed LVEF 60-65% awith grade 1 diastolic dysfunction and hypokinesis of the basal inferior wall. She also had very mild aortic stenosis with a mean gradient of 9 mmHg and trivial AR. PASP was 52 mmHg. She had a Lexiscan Myoviewat that time that was negative for ischemia. Shannon [name] previously had a Lexiscan Cardiolite in September 2013 that revealed a medium, mild reversible defect in the anterior wall and normal systolic function. She subsequently underwent cardiac catheterization that revealed nonobstructive coronary disease. She had mild pulmonary hypertension and a normal cardiac output. Left ventriculography revealed mild to moderate aortic regurgitation. She had a 24-hour Holter in February 2015 that revealed rare PACs and PVCs. Echo 01/06/17  revealed LVEF 60-65% with mild LVH and a mean aortic valve gradient of 8 mmHg. Tricuspid regurgitation was not significant enough to assess pulmonary pressures.  Repeat echo 01/2019 revealed LVEF >65%, severe LVH, intracavitary gradient 61 mmHg, mild AS (mean gradient 13 mmHg), mild-moderate AR, and mild ascending aorta aneurysm (4.1 cm).  Shannon [name] was admitted to the hospital 01/25/17 with hypertensive emergency and chest pain. Her blood pressure was 215/100.  She consistently struggles with mediation adherence and has strong feelings about what medicines she should take and when she should take them.  [name] lot of [facility] there but hasn't been going lately 2/2 COVID-19.  Lisinopril was stopped due to cough and throat swelling.  Ms. Marcinek reported claudication and had ABIs performed by her PCP, Dr. Daryll Drown. ABI/Dopplers 02/2019 revealed moderate R LE arterial disease and mild L LE disease.  She was referred to our vascular specialists but prefers to keep seeing me.    Ms. Guercio's clonidine was increased to 0.3 mg weekly.  Prior to the vaccine her BP was running in the 130s-140s.  She decided to stop taking her clonidine for a few days before she got her vaccine because she did not know how it would affect her.  She had a repeat echocardiogram 10/2019 that revealed LVEF 60 to 65% with mild LVH and grade 1 diastolic dysfunction.  The ascending aorta was normal in size.  Since her last appointment Shannon [name] was diagnosed with Stage 1 non-small cell lung cancer.  She will begin XRT soon.  She has been feeling poorly.  She was unable to have her XRT appointment  yesterday because of severe nausea.  She has also been very fatigued which she attributes to her lung cancer diagnosis.  She has a metallic taste in her mouth and is concerned she has a UTI.  She has discomfort in her back and reports dysuria.  She has not been taking her clonidine patch  for several weeks.  She states that she was told she couldn't use her clonidine patch while she was having imaging of her chest.  She is unsure who told her this but thinks it was a radiologist.  She also notes that she was planning to reduce the dose because her blood pressure was getting too low on 0.3 mg.  She notes that she only takes 20 mg of Lasix because if she takes more her legs start to cramp.  She does still have a lot of shortness of breath.  She denies orthopnea or PND.  She was evaluated by Dr. Kipp Brood but needs more for pulmonary function testing before decision can be made whether she can have surgery.  Chemotherapy is not needed for her lung cancer given that it was a stage I.  She had XRT and experienced some hair loss.  She completed her 5 sessions.  She was not a surgical cancer.  She follows up with them on 08/28/20.  Shannon [name] needs her teeth extracted and presents for pre-procedural clearance.  Her bottom teeth are lose and making it hard for her to bite.  She struggles with constipation and requires milk of magnesia.  Struggles with headache and nausea.  She denies emesis.  She has not been getting any exercise.  Her BP has been well-controlled in the 110s.  She feels her heart racing.  She had to stop Xarelto due to chills.  She slept of a heating pad and got a blister on her hip.  She stopped taking Xarelto to help it heal.  She since started it back.    Prior CV studies:   The following studies were reviewed today:  Echo 01/06/17: Study Conclusions  - Left ventricle: The cavity size was normal. Wall thickness was   increased in a pattern of mild LVH. Systolic function was normal.   The estimated ejection fraction was in the range of 60% to 65%.   Wall motion was normal; there were no regional wall motion   abnormalities. Doppler parameters are consistent with abnormal   left ventricular relaxation (grade 1 diastolic dysfunction). - Aortic valve: Trileaflet; moderately  calcified leaflets.   Sclerosis without stenosis. There was mild to moderate   regurgitation. Mean gradient (S): 8 mm Hg. - Mitral valve: There was no significant regurgitation. - Left atrium: The atrium was moderately dilated. - Right ventricle: The cavity size was normal. Systolic function   was normal. - Right atrium: The atrium was mildly dilated. - Pulmonary arteries: No complete TR doppler jet so unable to   estimate PA systolic pressure. - Systemic veins: IVC measured 2.2 cm with > 50% respirophasic   variation, suggesting RA pressure 8 mmHg.  Impressions: - Normal LV size with mild LV hypertrophy. EF 60-65%. Calcified,   trileaflet aortic valve. Sclerosis without significant stenosis,   mild to moderate aortic insufficiency. Normal RV size and   systolic function. Biatrial enlargement.  Echo 11/15/19: 1. Left ventricular ejection fraction, by estimation, is 60 to 65%. The  left ventricle has normal function. The left ventricle has no regional  wall motion abnormalities. There is mild left ventricular hypertrophy.  Left ventricular  diastolic parameters  are consistent with Grade I diastolic dysfunction (impaired relaxation).  2. Right ventricular systolic function is normal. The right ventricular  size is normal. There is normal pulmonary artery systolic pressure. The  estimated right ventricular systolic pressure is 34.1 mmHg.  3. The mitral valve is degenerative. No evidence of mitral valve  regurgitation. No evidence of mitral stenosis. There is turbulence across  the mitral valve but it does not appear restricted and there is no  evidence for a significant pressure gradient  across it.  4. The aortic valve is tricuspid. Aortic valve regurgitation is trivial.  Mild aortic valve sclerosis is present, with no evidence of aortic valve  stenosis.  5. The inferior vena cava is normal in size with greater than 50%  respiratory variability, suggesting right atrial  pressure of 3 mmHg.   Lower extremity Doppler 03/09/19: Tibial vessel disease; one vessel run-off via peroneal artery.    Summary:  Right: Near normal examination. No hemodynamically significant arterial  stenosis/occlusion noted in the CFA, DFA, SFA, popliteal artery and TPT.   Tibial vessel disease; zero vessel run-off.   Left: Near normal examination. No hemodynamically significant arterial  stenosis/occlusion noted in the CFA, DFA, SFA, popliteal artery and TPT.   Tibial vessel disease; one vessel run-off via peroneal artery.   Past Medical History:  Diagnosis Date  . Anemia   . Anxiety    HX PANIC ATTACKS  . Arthritis    "starting to; in my hands" (07/09/2015)  . Asthma   . Atrial fibrillation ([place])   . Atrial flutter, paroxysmal ([place])   . Bloated abdomen   . CFS (chronic fatigue syndrome)   . Chewing difficulty   . Chronic asthma with acute exacerbation    "I have chronic asthma all the time; sometimes exacerbations" (07/09/2015)  . Chronic lower back pain   . COPD (chronic obstructive pulmonary disease) ([place])   . Cyst of right kidney    "3 of them; dx'd in ~ 01/2015"  . Dyspnea   . GERD (gastroesophageal reflux disease)   . Heart murmur   . History of blood transfusion    "related to my brain surgery I think"  . History of pulmonary embolism 07/09/2015  . HIV antibody positive ([place])   . HIV disease ([place])   . Hyperlipidemia   . Hypertension   . Leg edema   . Lipodystrophy   . Mild CAD 2013  . Multiple thyroid nodules   . Osteopenia   . Palpitations   . Pneumonia 07/09/2015  . Shingles   . Sleep apnea    "never completed part 2 of study; never wore mask" (07/09/2015)  . Vitamin B 12 deficiency   . Vitamin D deficiency    Past Surgical History:  Procedure Laterality Date  . ABDOMINAL HYSTERECTOMY     "robotic laparosopic"  . BRAIN SURGERY  1974   "brain tumor; benign; on top of my brain; got a plate in there"  . BRAIN SURGERY     age 57- -"Tumor  pushing my skullout"  . BRONCHIAL BIOPSY  02/13/2020   Procedure: BRONCHIAL BIOPSIES;  Surgeon: [name], MD;  Location[facility] ENDOSCOPY;  Service: Pulmonary;;  . BRONCHIAL BRUSHINGS  02/13/2020   Procedure: BRONCHIAL BRUSHINGS;  Surgeon: [name], MD;  Location: [facility] ENDOSCOPY;  Service: Pulmonary;;  . BRONCHIAL NEEDLE ASPIRATION BIOPSY  02/13/2020   Procedure: BRONCHIAL NEEDLE ASPIRATION BIOPSIES;  Surgeon: [name], MD;  Location: [facility] ENDOSCOPY;  Service: Pulmonary;;  .  CARDIAC CATHETERIZATION    . FIDUCIAL MARKER PLACEMENT  02/13/2020   Procedure: FIDUCIAL MARKER PLACEMENT;  Surgeon: [name], MD;  Location: [facility] ENDOSCOPY;  Service: Pulmonary;;  . TONSILLECTOMY AND ADENOIDECTOMY    . VIDEO BRONCHOSCOPY WITH ENDOBRONCHIAL NAVIGATION N/A 02/13/2020   Procedure: VIDEO BRONCHOSCOPY WITH ENDOBRONCHIAL NAVIGATION;  Surgeon: [name], MD;  Location: [facility] ENDOSCOPY;  Service: Pulmonary;  Laterality: N/A;     Current Meds  Medication Sig  . acetaminophen (TYLENOL) 325 MG tablet Take 325 mg by mouth every 6 (six) hours as needed (FOR PAIN).  [name] albuterol (PROVENTIL) (2.5 MG/3ML) 0.083% nebulizer solution Take 3 mLs (2.5 mg total) by nebulization every 6 (six) hours as needed for wheezing or shortness of breath.  . Ascorbic Acid (VITAMIN C) 1000 MG tablet Take 1,000 mg by mouth every other day.   . b complex vitamins tablet Take 1 tablet by mouth daily.  [name] BIKTARVY 50-200-25 MG TABS tablet TAKE 1 TABLET BY MOUTH DAILY  . BIOTIN PO Take 1 tablet by mouth daily as needed (for supplementation).   . calcium carbonate (OSCAL) 1500 (600 Ca) MG TABS tablet Take 600 mg of elemental calcium by mouth daily with breakfast.  . Cholecalciferol (SM VITAMIN D3) 100 MCG (4000 UT) CAPS Take 1 capsule (4,000 Units total) by mouth daily.  . cloNIDine (CATAPRES) 0.1 MG tablet Take 1 tablet (0.1 mg total) by mouth every 12 (twelve) hours.  [name] doxycycline (VIBRA-TABS) 100 MG tablet Take 1 tablet (100  mg total) by mouth 2 (two) times daily.  . fluticasone furoate-vilanterol (BREO ELLIPTA) 200-25 MCG/INH AEPB Inhale 1 puff into the lungs daily.  . furosemide (LASIX) 20 MG tablet Take 1 tablet (20 mg total) by mouth every other day.  [name] MAGNESIUM PO Take 1 tablet by mouth every other day.   . Melatonin 5 MG TABS Take 5 mg by mouth at bedtime as needed (for sleep).  . Misc. Devices (PULSE OXIMETER FOR FINGER) MISC 1 Units by Does not apply route as needed.  . Mouthwashes (BIOTENE DRY MOUTH GENTLE) LIQD Use as directed 1 Dose in the mouth or throat 2 (two) times daily as needed (dry mouth).  . Omega-3 Fatty Acids (FISH OIL) 1000 MG CAPS Take 1,000 mg by mouth daily as needed (pt prefrence).  [name] omeprazole (PRILOSEC) 40 MG capsule Take 1 capsule (40 mg total) by mouth daily as needed (heart burn).  . OXYGEN Inhale 1.5-2 L into the lungs See admin instructions. Uses at bedtime and through the day as needed  . polyethylene glycol (MIRALAX / GLYCOLAX) 17 g packet Take 17 g by mouth daily as needed for mild constipation.  [name] PREVIDENT 5000 SENSITIVE 1.1-5 % PSTE Use daily as directed  . senna (SENOKOT) 8.6 MG tablet Take 1 tablet by mouth daily as needed for constipation.  . SYRINGE/NEEDLE, DISP, 1 ML (B-D SYRINGE/NEEDLE 1CC/25GX5/8) 25G X 5/8" 1 ML MISC 1 Units by Does not apply route every 30 (thirty) days.  . vitamin A 3 MG (10000 UNITS) capsule Take 10,000 Units by mouth daily.  [name] zinc gluconate 50 MG tablet Take 50 mg by mouth daily.  . [DISCONTINUED] cyanocobalamin (,VITAMIN B-12,) 1000 MCG/ML injection INJECT 1 ML IN THE MUSCLE EVERY 30 DAYS  . [DISCONTINUED] diltiazem (CARDIZEM CD) 240 MG 24 hr capsule TAKE 1 CAPSULE(240 MG) BY MOUTH DAILY (Patient taking differently: Take 240 mg by mouth daily. )  . [DISCONTINUED] furosemide (LASIX) 20 MG tablet Take 1 tablet (20 mg total)  by mouth daily. (Patient taking differently: Take 20 mg by mouth daily. As needed)  . [DISCONTINUED] PROAIR HFA 108 (90 Base)  MCG/ACT inhaler Inhale 2 puffs into the lungs every 4 (four) hours as needed for wheezing or shortness of breath. INHALE 2 PUFFS INTO THE LUNGS EVERY 4 HOURS AS NEEDED FOR WHEEZING OR SHORTNESS OF BREATH  . [DISCONTINUED] spironolactone (ALDACTONE) 25 MG tablet Take 0.5 tablets (12.5 mg total) by mouth daily.  . [DISCONTINUED] XARELTO 20 MG TABS tablet TAKE 1 TABLET(20 MG) BY MOUTH DAILY WITH SUPPER     Allergies:   Tree extract, Augmentin [amoxicillin-pot clavulanate], Lisinopril, and Ciprofloxacin   Social History   Tobacco Use  . Smoking status: Never Smoker  . Smokeless tobacco: Never Used  Vaping Use  . Vaping Use: Never used  Substance Use Topics  . Alcohol use: Not Currently    Alcohol/week: 0.0 standard drinks    Comment: Rarely.  . Drug use: No     Family Hx: The patient's family history includes Asthma in her mother; Diabetes in her sister; Heart disease in her mother; Heart failure in her mother; Heart murmur in her brother, sister, and sister; Hypertension in her mother; Obesity in her mother; Thyroid disease in her mother and sister.   ROS:   Please see the history of present illness.     All other systems reviewed and are negative.   Labs/Other Tests and Data Reviewed:    Recent Labs: 02/15/2020: B Natriuretic Peptide 73.6 02/17/2020: Magnesium 2.0 05/24/2020: ALT 16; BUN 20; Creat 1.36; Hemoglobin 12.9; Platelets 194; Potassium 4.2; Sodium 143   Recent Lipid Panel Lab Results  Component Value Date/Time   CHOL 249 (H) 11/10/2019 10:55 AM   CHOL 253 (H) 05/03/2018 10:43 AM   TRIG 53 11/10/2019 10:55 AM   HDL 70 11/10/2019 10:55 AM   HDL 86 05/03/2018 10:43 AM   CHOLHDL 3.6 11/10/2019 10:55 AM   LDLCALC 164 (H) 11/10/2019 10:55 AM    Wt Readings from Last 3 Encounters:  07/03/20 238 lb 12.8 oz (108.3 kg)  06/24/20 239 lb 3.2 oz (108.5 kg)  05/23/20 237 lb (107.5 kg)     EKG:   07/03/2020: Sinus tachycardia.  Rate 103 bpm.  Left axis  deviation.  Objective:    VS:  BP 135/78   Pulse (!) 103   Temp 98.4 F (36.9 C)   Ht 5\' 3"  (1.6 m)   Wt 238 lb 12.8 oz (108.3 kg)   SpO2 93%   BMI 42.30 kg/m  , BMI Body mass index is 42.3 kg/m. GENERAL:  Chronically ill-appearing.  No acute distress HEENT: Pupils equal round and reactive, fundi not visualized, oral mucosa unremarkable NECK:  No jugular venous distention, waveform within normal limits, carotid upstroke brisk and symmetric, no bruits, no thyromegaly LYMPHATICS:  No cervical adenopathy LUNGS:  Clear to auscultation bilaterally HEART:  RRR.  PMI not displaced or sustained,S1 and S2 within normal limits, no S3, no S4, no clicks, no rubs, no murmurs ABD:  Central adiposity, positive bowel sounds normal in frequency in pitch, no bruits, no rebound, no guarding, no midline pulsatile mass, no hepatomegaly, no splenomegaly EXT: 1+ DP/PT bilaterally, no edema, no cyanosis no clubbing SKIN:  No rashes no nodules.   NEURO:  Cranial nerves II through XII grossly intact, motor grossly intact throughout PSYCH:  Cognitively intact, oriented to person place and time   ASSESSMENT & PLAN:    # Tooth extraction:  Low risk for teeth  extraction without general anesthesia.  Agreed that she should not have general anesthesia if possible.  Okay to hold Xarelto for 3 days prior to her extraction given that she will need several teeth pulled.  # Hypertension: Blood pressure is doing much better lately.  Continue clonidine, furosemide, diltiazem, and spironolactone.   # Dysuria;  Check a U/A.  # Chronic diastolic heart failure: She appears to be euvolemic on exam.  She notes that in the past when she felt like that she had extra fluid on board.  We ordered a BNP and she never had it drawn.  Symptoms have resolved.   # Aortic regurgitation # Aortic stenosis: Repeat echo 10/2019 was negative for aortic stenosis and ascending aortic aneurysm. Both echos were reviewed.  It appears to be  very mild, on the border between aortic stenosis and sclerosis.  # Hyperlipidemia:Lipids are elevated.  She isn't ready to start more medication.  Discussed limiting fried food, fatty food, cheese and red meat.  Will repeat lipids at follow up and discuss statins.   # Atrial flutter: Converted with flecainide. Continue diltiazem and Xarelto.   # Ascending aorta aneurysm: 4.0 on CT 12/2016.It was unchanged on MRA 10/2018.  4.1 cm on echo 01/2019.  Will repeat 01/2020.  BP control as above.   # PAD: Lipid control as above.  She prefers to manage her other issues prior to seeing her vascular team.   Time:   Today, I have spent 40 minutes with the patient with telehealth technology discussing hypertension.   Medication Adjustments/Labs and Tests Ordered: Current medicines are reviewed at length with the patient today.  Concerns regarding medicines are outlined above.   Tests Ordered: No orders of the defined types were placed in this encounter.  Medication Changes: No orders of the defined types were placed in this encounter.   Disposition:  Follow up in 3 month(s)  Signed, Skeet Latch, MD  07/24/2020 5:37 PM    [place]

## 2020-07-03 NOTE — Progress Notes (Signed)
Internal Medicine Clinic Attending  CCM services provided by the care management provider and their documentation were reviewed with Dr. Myrtie Hawk.  We reviewed the pertinent findings, urgent action items addressed by the resident and non-urgent items to be addressed by the PCP.  I agree with the assessment, diagnosis, and plan of care documented in the CCM and resident's note.  Oda Kilts, MD 07/03/2020

## 2020-07-04 ENCOUNTER — Ambulatory Visit: Payer: Medicare Other

## 2020-07-04 ENCOUNTER — Telehealth: Payer: Self-pay | Admitting: Cardiovascular Disease

## 2020-07-04 ENCOUNTER — Telehealth (HOSPITAL_COMMUNITY): Payer: Self-pay

## 2020-07-04 DIAGNOSIS — N183 Chronic kidney disease, stage 3 unspecified: Secondary | ICD-10-CM

## 2020-07-04 DIAGNOSIS — I10 Essential (primary) hypertension: Secondary | ICD-10-CM

## 2020-07-04 DIAGNOSIS — I5032 Chronic diastolic (congestive) heart failure: Secondary | ICD-10-CM

## 2020-07-04 NOTE — Telephone Encounter (Signed)
OK to wait for the stress test.

## 2020-07-04 NOTE — Telephone Encounter (Signed)
Detailed instructions left on the patient's answering machine. Asked to call back with any questions. S.Roseana Rhine EMTP 

## 2020-07-04 NOTE — Chronic Care Management (AMB) (Signed)
Care Management   Follow Up Note   07/04/2020 Name: Shannon [name] MRN: [id number] DOB: April 12, 1956  Referred by: [name], MD Reason for referral : Care Coordination (DME, dental treatment)   Shannon [name] is a 64 y.o. year old female who is a primary care patient of [name], MD. The care management team was consulted for assistance with care management and care coordination needs.    Review of patient status, including review of consultants reports, relevant laboratory and other test results, and collaboration with appropriate care team members and the patient's provider was performed as part of comprehensive patient evaluation and provision of chronic care management services.    SDOH (Social Determinants of Health) assessments performed: No See Care Plan activities for detailed interventions related to [facility])     Advanced Directives: See Care Plan and Vynca application for related entries.   Goals Addressed              This Visit's Progress   .  "I need a lift chair and my O2 concentrator delivered. I am very frustrated because I can't get through to [employer]."" (pt-stated)        CARE PLAN ENTRY (see longitudinal plan of care for additional care plan information)  Current Barriers:  . Patient seeking lift chair and O2 concentrator.  Returned call to patient in response to message left at 1:32 pm today. Patient voiced appreciation for this CCM RN's effort in clarifying the O2 equipment and assisting with completion of the lift chair paperwork.   Clinical Social Work Clinical Goal(s):  [name] Over the next 90 days, patient will work with SW to address concerns related to need for DME/lift chair  Interventions: . [employer] sent to [employer] team requesting contact information for follow up on lift mechanism as Certificate of Medical Necessity was faxed on 06/27/20.  (Previous messages sent to [place] pool which does not seem to be sending messages  accurately) . Received response from Miquel Dunn with [employer] stating message will be forwarded to appropriate department to provide update/follow up  . Received voicemail message from [place] with [employer] stating Certificate of Medical Necessity has not been received  . Called Elberta Fortis back at [phone] ext. [phone] but had to leave message . Informed Elberta Fortis in message that Certificate of Medical Necessity was faxed to [phone] on 06/27/20 . Requested call back to provide appropriate fax number to send certificate . Requested that this information be left in voicemail box if CCM BSW is unable to answer at time of call . Received call back from [facility] with [place] . As requested, certificate sent via secure email to [email or web address] . Requested that [facility] contact patient for follow up  Patient Self Care Activities:  . Self administers medications as prescribed . Calls provider office for new concerns or questions . Unable to perform IADLs independently  Please see past updates related to this goal by clicking on the "Past Updates" button in the selected goal      .  COMPLETED: Patient scheduled for dental treatment (pt-stated)        CARE PLAN ENTRY (see longitudinal plan of care for additional care plan information)  Current Barriers:  . Patient scheduled for dental treatment and needs medical clearance  Clinical Social Work Clinical Goal(s):  [name] Over the next 14 days, patient will work with SW for medical clearance to receive dental treatment  Interventions: . Inter-disciplinary care team collaboration (see  longitudinal plan of care) . Completed Medical Clearance for Dental Treatment received from Dr. Gilles Chiquito. Kandice Robinsons the office of Darel Hong, DDS, to obtain fax number for submission of form . Sent form via secure email as representative stated that fax machine is not working and they are unsure when it will be repaired.  . Requested  confirmation of receipt of form . Called Dr. Andrey Cota office for follow and representative confirmed receipt of form . Patient has appointment for extractions on 07/11/20.    [name] Patient Self Care Activities:  . Self administers medications as prescribed . Attends all scheduled provider appointments . Calls provider office for new concerns or questions . Unable to perform IADLs independently  Please see past updates related to this goal by clicking on the "Past Updates" button in the selected goal          The patient has been provided with contact information for the care management team and has been advised to call with any health related questions or concerns.     Ronn Melena, [place] Coordination Social Worker [place] [phone]

## 2020-07-04 NOTE — Telephone Encounter (Signed)
[place] is calling requesting to speak with Rip Harbour asap in regards to the call she received earlier today as well as an antibiotic. Please advise.

## 2020-07-04 NOTE — Patient Instructions (Signed)
Visit Information  Goals Addressed              This Visit's Progress   .  "I need a lift chair and my O2 concentrator delivered. I am very frustrated because I can't get through to [employer]."" (pt-stated)        CARE PLAN ENTRY (see longitudinal plan of care for additional care plan information)  Current Barriers:  . Patient seeking lift chair and O2 concentrator.  Returned call to patient in response to message left at 1:32 pm today. Patient voiced appreciation for this CCM RN's effort in clarifying the O2 equipment and assisting with completion of the lift chair paperwork.   Clinical Social Work Clinical Goal(s):  [name] Over the next 90 days, patient will work with SW to address concerns related to need for DME/lift chair  Interventions: . [employer] sent to [employer] team requesting contact information for follow up on lift mechanism as Certificate of Medical Necessity was faxed on 06/27/20.  (Previous messages sent to [place] pool which does not seem to be sending messages accurately) . Received response from Miquel Dunn with [employer] stating message will be forwarded to appropriate department to provide update/follow up  . Received voicemail message from [place] with [employer] stating Certificate of Medical Necessity has not been received  . Called Elberta Fortis back at [phone] ext. [phone] but had to leave message . Informed Elberta Fortis in message that Certificate of Medical Necessity was faxed to [phone] on 06/27/20 . Requested call back to provide appropriate fax number to send certificate . Requested that this information be left in voicemail box if CCM BSW is unable to answer at time of call . Received call back from [facility] with [place] . As requested, certificate sent via secure email to [email or web address] . Requested that [facility] contact patient for follow up  Patient Self Care Activities:  . Self administers medications as  prescribed . Calls provider office for new concerns or questions . Unable to perform IADLs independently  Please see past updates related to this goal by clicking on the "Past Updates" button in the selected goal      .  COMPLETED: Patient scheduled for dental treatment (pt-stated)        CARE PLAN ENTRY (see longitudinal plan of care for additional care plan information)  Current Barriers:  . Patient scheduled for dental treatment and needs medical clearance  Clinical Social Work Clinical Goal(s):  [name] Over the next 14 days, patient will work with SW for medical clearance to receive dental treatment  Interventions: . Inter-disciplinary care team collaboration (see longitudinal plan of care) . Completed Medical Clearance for Dental Treatment received from Dr. Gilles Chiquito. Kandice Robinsons the office of Darel Hong, DDS, to obtain fax number for submission of form . Sent form via secure email as representative stated that fax machine is not working and they are unsure when it will be repaired.  . Requested confirmation of receipt of form . Called Dr. Andrey Cota office for follow and representative confirmed receipt of form . Patient has appointment for extractions on 07/11/20.    [name] Patient Self Care Activities:  . Self administers medications as prescribed . Attends all scheduled provider appointments . Calls provider office for new concerns or questions . Unable to perform IADLs independently  Please see past updates related to this goal by clicking on the "Past Updates" button in the selected goal  The patient has been provided with contact information for the care management team and has been advised to call with any health related questions or concerns.   Ronn Melena, [place] Coordination Social Worker [place] [phone]

## 2020-07-04 NOTE — Telephone Encounter (Signed)
Spoke with patient who states that she needs to reschedule her myo view perfusion test due to having oral surgery scheduled for the same day 11/18. Patient states that she has been unable to rest the last several days and has been very congested in the evenings, with earache, sinus drainage and headaches. Patient states she feels like she needs an antibiotic prior to her oral surgery for her infection in her teeth which she feels is making her sick. Patient states that she meant to ask Dr. Lajuana Ripple about these issues yesterday when she saw her in clinic. Patient would like to make Dr. Oval Linsey and Rip Harbour aware and see if Dr. Oval Linsey has any suggestions or advice. Advised patient I would forward message to Dr. Oval Linsey but that she was out of office today. Patient verbalized understanding and stated that she would like if someone could call her back tomorrow afternoon.

## 2020-07-05 ENCOUNTER — Telehealth (HOSPITAL_COMMUNITY): Payer: Self-pay | Admitting: Cardiology

## 2020-07-05 ENCOUNTER — Ambulatory Visit: Payer: Medicare Other

## 2020-07-05 ENCOUNTER — Other Ambulatory Visit: Payer: Self-pay

## 2020-07-05 ENCOUNTER — Other Ambulatory Visit: Payer: Self-pay | Admitting: Internal Medicine

## 2020-07-05 DIAGNOSIS — I5032 Chronic diastolic (congestive) heart failure: Secondary | ICD-10-CM

## 2020-07-05 NOTE — Progress Notes (Signed)
Internal Medicine Clinic Resident  I have personally reviewed this encounter including the documentation in this note and/or discussed this patient with the care management provider. I will address any urgent items identified by the care management provider and will communicate my actions to the patient's PCP. I have reviewed the patient's CCM visit with my supervising attending, Dr Jimmye Norman.  Lorene Dy, MD 07/05/2020

## 2020-07-05 NOTE — Telephone Encounter (Signed)
Advised patient, she did get antibiotic today Is scheduled next week for oral surgery

## 2020-07-05 NOTE — Telephone Encounter (Signed)
Just an FYI. Patient has cancelled multiple appointments for Myoview and she states she is not feeling well and will call at a later date to possibly reschedule.  Order will be removed from the [place] and if patient calls back we will reinstate the order.   Thank you

## 2020-07-05 NOTE — Telephone Encounter (Signed)
Left message to call back  Contact oral surgeon/dentist regarding antibiotic

## 2020-07-05 NOTE — Progress Notes (Signed)
Attending attestation: This CCM documentation is discussed, reviewed with Dr. Court Joy, and approved. Dorian Pod, MD

## 2020-07-05 NOTE — Telephone Encounter (Signed)
Please advise on refill.

## 2020-07-08 ENCOUNTER — Other Ambulatory Visit: Payer: Self-pay | Admitting: Internal Medicine

## 2020-07-09 DIAGNOSIS — H40013 Open angle with borderline findings, low risk, bilateral: Secondary | ICD-10-CM | POA: Diagnosis not present

## 2020-07-10 ENCOUNTER — Ambulatory Visit: Payer: Medicare Other | Admitting: *Deleted

## 2020-07-10 ENCOUNTER — Other Ambulatory Visit: Payer: Self-pay | Admitting: Internal Medicine

## 2020-07-10 DIAGNOSIS — N183 Chronic kidney disease, stage 3 unspecified: Secondary | ICD-10-CM

## 2020-07-10 DIAGNOSIS — J4489 Other specified chronic obstructive pulmonary disease: Secondary | ICD-10-CM

## 2020-07-10 DIAGNOSIS — I2510 Atherosclerotic heart disease of native coronary artery without angina pectoris: Secondary | ICD-10-CM

## 2020-07-10 DIAGNOSIS — I5032 Chronic diastolic (congestive) heart failure: Secondary | ICD-10-CM

## 2020-07-10 DIAGNOSIS — E538 Deficiency of other specified B group vitamins: Secondary | ICD-10-CM

## 2020-07-10 DIAGNOSIS — J449 Chronic obstructive pulmonary disease, unspecified: Secondary | ICD-10-CM

## 2020-07-10 DIAGNOSIS — I10 Essential (primary) hypertension: Secondary | ICD-10-CM

## 2020-07-10 DIAGNOSIS — B20 Human immunodeficiency virus [HIV] disease: Secondary | ICD-10-CM

## 2020-07-10 NOTE — Chronic Care Management (AMB) (Signed)
Chronic Care Management   Follow Up Note   07/10/2020 Name: Shannon [name] MRN: [id number] DOB: Dec 31, 1955  Referred by: [name], MD Reason for referral : Chronic Care Management (HTN,CKD,HLD,Asthma,COPD,HF, CAD, HIV, new dx lung Ca)   Shannon [name] is a 64 y.o. year old female who is a primary care patient of [name], MD. The CCM team was consulted for assistance with chronic disease management and care coordination needs.    Review of patient status, including review of consultants reports, relevant laboratory and other test results, and collaboration with appropriate care team members and the patient's provider was performed as part of comprehensive patient evaluation and provision of chronic care management services.    SDOH (Social Determinants of Health) assessments performed: No See Care Plan activities for detailed interventions related to [facility])     Outpatient Encounter Medications as of 07/10/2020  Medication Sig  . furosemide (LASIX) 20 MG tablet Take 1 tablet (20 mg total) by mouth daily. As needed  . XARELTO 20 MG TABS tablet TAKE 1 TABLET(20 MG) BY MOUTH DAILY WITH SUPPER  . acetaminophen (TYLENOL) 325 MG tablet Take 325 mg by mouth every 6 (six) hours as needed (FOR PAIN).  [name] albuterol (PROVENTIL) (2.5 MG/3ML) 0.083% nebulizer solution Take 3 mLs (2.5 mg total) by nebulization every 6 (six) hours as needed for wheezing or shortness of breath.  . Ascorbic Acid (VITAMIN C) 1000 MG tablet Take 1,000 mg by mouth every other day.   . b complex vitamins tablet Take 1 tablet by mouth daily.  [name] BIKTARVY 50-200-25 MG TABS tablet TAKE 1 TABLET BY MOUTH DAILY  . BIOTIN PO Take 1 tablet by mouth daily as needed (for supplementation).   . calcium carbonate (OSCAL) 1500 (600 Ca) MG TABS tablet Take 600 mg of elemental calcium by mouth daily with breakfast.  . Cholecalciferol (SM VITAMIN D3) 100 MCG (4000 UT) CAPS Take 1 capsule (4,000 Units total) by mouth daily.  .  cloNIDine (CATAPRES) 0.1 MG tablet Take 1 tablet (0.1 mg total) by mouth every 12 (twelve) hours.  . cyanocobalamin (,VITAMIN B-12,) 1000 MCG/ML injection INJECT 1 ML IN THE MUSCLE EVERY 30 DAYS  . diltiazem (CARDIZEM CD) 240 MG 24 hr capsule Take 1 capsule (240 mg total) by mouth daily.  [name] doxycycline (VIBRA-TABS) 100 MG tablet Take 1 tablet (100 mg total) by mouth 2 (two) times daily.  . fluticasone furoate-vilanterol (BREO ELLIPTA) 200-25 MCG/INH AEPB Inhale 1 puff into the lungs daily.  . furosemide (LASIX) 20 MG tablet Take 1 tablet (20 mg total) by mouth every other day.  [name] MAGNESIUM PO Take 1 tablet by mouth every other day.   . Melatonin 5 MG TABS Take 5 mg by mouth at bedtime as needed (for sleep).  . Misc. Devices (PULSE OXIMETER FOR FINGER) MISC 1 Units by Does not apply route as needed.  . Mouthwashes (BIOTENE DRY MOUTH GENTLE) LIQD Use as directed 1 Dose in the mouth or throat 2 (two) times daily as needed (dry mouth).  . Omega-3 Fatty Acids (FISH OIL) 1000 MG CAPS Take 1,000 mg by mouth daily as needed (pt prefrence).  [name] omeprazole (PRILOSEC) 40 MG capsule Take 1 capsule (40 mg total) by mouth daily as needed (heart burn).  . OXYGEN Inhale 1.5-2 L into the lungs See admin instructions. Uses at bedtime and through the day as needed  . polyethylene glycol (MIRALAX / GLYCOLAX) 17 g packet Take 17 g by mouth daily as  needed for mild constipation.  [name] PREVIDENT 5000 SENSITIVE 1.1-5 % PSTE Use daily as directed  . PROAIR HFA 108 (90 Base) MCG/ACT inhaler INHALE 2 PUFFS INTO THE LUNGS EVERY 4 HOURS AS NEEDED FOR WHEEZING OR SHORTNESS OF BREATH  . senna (SENOKOT) 8.6 MG tablet Take 1 tablet by mouth daily as needed for constipation.  [name] spironolactone (ALDACTONE) 25 MG tablet Take 1 tablet (25 mg total) by mouth daily.  [name] spironolactone (ALDACTONE) 25 MG tablet Take 0.5 tablets (12.5 mg total) by mouth daily.  . SYRINGE/NEEDLE, DISP, 1 ML (B-D SYRINGE/NEEDLE 1CC/25GX5/8) 25G X 5/8" 1 ML MISC 1  Units by Does not apply route every 30 (thirty) days.  . vitamin A 3 MG (10000 UNITS) capsule Take 10,000 Units by mouth daily.  [name] zinc gluconate 50 MG tablet Take 50 mg by mouth daily.   No facility-administered encounter medications on file as of 07/10/2020.     Objective:  Wt Readings from Last 3 Encounters:  07/03/20 238 lb 12.8 oz (108.3 kg)  06/24/20 239 lb 3.2 oz (108.5 kg)  05/23/20 237 lb (107.5 kg)   BP Readings from Last 3 Encounters:  07/03/20 135/78  06/24/20 (!) 142/62  05/23/20 131/85   Lab Results  Component Value Date   CHOL 249 (H) 11/10/2019   HDL 70 11/10/2019   LDLCALC 164 (H) 11/10/2019   TRIG 53 11/10/2019   CHOLHDL 3.6 11/10/2019    Goals Addressed              This Visit's Progress     Patient Stated   .  "They are trying to kick me out of my townhouse under the RAD program and I may need documentation from my doctor that I have health issues to keep me from being evicted." (pt-stated)        [place] (see longitudinal plan of care for additional care plan information)  Current Barriers:  . Care Coordination needs related to housing issues in a patient with HTN,CKD,HLD,Asthma,COPD,HF, CAD, HIV, new dx lung Ca- spoke with patient by phone, she was extremely upset as a piece of paper without a letterhead was slipped under her townhouse door indicating she has agreed to forfeit her section 8 housing voucher, she says the owners of her housing complex are trying to get the residents to move out so they can improve the units and remove them from section 8 housing, she says she may need documentation from her clinic provider to assist with allowing her to stay in her current townhouse under [place] housing  Nurse Case Manager Clinical Goal(s):  [name] Over the next 90-120 days, patient will work with CCM team and clinic staff to address needs related to housing  Interventions:  . Inter-disciplinary care team collaboration (see longitudinal plan of  care) . Provided over 40 minutes to allow patient to voice her concern about her housing situation . Advised patient to contact the CCM team when she needs the requested documentation from her clinic providers so an appointment can be arranged to discuss and compose the requested letter/documentation . Advised CCM BSW [place] of patient's housing concern  Patient Self Care Activities:  . Patient verbalizes understanding of plan to work with CCM team and clinic staff to address housing concerns . Unable to independently resolve housing issues  Initial goal documentation         Plan:   The care management team will reach out to the patient again over the next  30-60 days.    Kelli Churn RN, CCM, [facility] RN Care Manager [phone]

## 2020-07-10 NOTE — Patient Instructions (Signed)
Visit Information Please contact the CCM team when you need the documentation from your clinic provider to address your housing concerns. Goals Addressed              This Visit's Progress     Patient Stated     "They are trying to kick me out of my townhouse under the RAD program and I may need documentation from my doctor that I have helath issues to keep me from being evicted." (pt-stated)        Shannon [name] (see longitudinal plan of care for additional care plan information)  Current Barriers:   Care Coordination needs related to housing issues in a patient with HTN,CKD,HLD,Asthma,COPD,HF, CAD, HIV, new dx lung Ca- spoke with patient by phone, she was extremely upset as a piece of paper without a letterhead was slipped under her townhouse door indicating she has agreed to forfeit her section 8 housing voucher, she says the owners of her housing complex are trying to get the residents to move out so they can improve the units and remove them from section 8 housing, she says she may need documentation from her clinic provider to assist with allowing her to stay in her current townhouse under [place] 8 housing  Nurse Case Manager Clinical Goal(s):   Over the next 90-120 days, patient will work with CCM team and clinic staff to address needs related to housing  Interventions:   Inter-disciplinary care team collaboration (see longitudinal plan of care)  Provided over 40 minutes to allow patient to voice her concern about her housing situation  Advised patient to contact the CCM team when she needs the requested documentation from her clinic providers so an appointment can be arranged to discuss and compose the requested letter/documentation  [place] of patient's housing concern  Patient Self Care Activities:   Patient verbalizes understanding of plan to work with CCM team and clinic staff to address housing concerns  Unable to independently resolve  housing issues  Initial goal documentation        The patient verbalized understanding of instructions, educational materials, and care plan provided today and declined offer to receive copy of patient instructions, educational materials, and care plan.   The care management team will reach out to the patient again over the next 30-60 days.   Kelli Churn RN, CCM, [facility] RN Care Manager [phone]

## 2020-07-11 ENCOUNTER — Ambulatory Visit (HOSPITAL_COMMUNITY): Payer: Medicare Other

## 2020-07-11 NOTE — Progress Notes (Signed)
Internal Medicine Clinic Resident  I have personally reviewed this encounter including the documentation in this note and/or discussed this patient with the care management provider. I will address any urgent items identified by the care management provider and will communicate my actions to the patient's PCP. I have reviewed the patient's CCM visit with my supervising attending, Dr Philipp Ovens.  Sanjuana Letters, MD 07/11/2020

## 2020-07-11 NOTE — Progress Notes (Signed)
Internal Medicine Clinic Attending  CCM services provided by the care management provider and their documentation were discussed with Dr. Johnney Ou . We reviewed the pertinent findings, urgent action items addressed by the resident and non-urgent items to be addressed by the PCP.  I agree with the assessment, diagnosis, and plan of care documented in the CCM and resident's note.  Velna Ochs, MD 07/11/2020

## 2020-07-13 DIAGNOSIS — J449 Chronic obstructive pulmonary disease, unspecified: Secondary | ICD-10-CM | POA: Diagnosis not present

## 2020-07-13 DIAGNOSIS — R269 Unspecified abnormalities of gait and mobility: Secondary | ICD-10-CM | POA: Diagnosis not present

## 2020-07-13 DIAGNOSIS — J45909 Unspecified asthma, uncomplicated: Secondary | ICD-10-CM | POA: Diagnosis not present

## 2020-07-13 DIAGNOSIS — G4733 Obstructive sleep apnea (adult) (pediatric): Secondary | ICD-10-CM | POA: Diagnosis not present

## 2020-07-19 ENCOUNTER — Other Ambulatory Visit: Payer: Self-pay | Admitting: Internal Medicine

## 2020-07-22 ENCOUNTER — Other Ambulatory Visit: Payer: Self-pay | Admitting: Internal Medicine

## 2020-07-22 DIAGNOSIS — E538 Deficiency of other specified B group vitamins: Secondary | ICD-10-CM

## 2020-07-23 ENCOUNTER — Ambulatory Visit: Payer: Medicare Other

## 2020-07-23 ENCOUNTER — Telehealth: Payer: Self-pay

## 2020-07-23 DIAGNOSIS — I10 Essential (primary) hypertension: Secondary | ICD-10-CM

## 2020-07-23 DIAGNOSIS — B20 Human immunodeficiency virus [HIV] disease: Secondary | ICD-10-CM

## 2020-07-23 DIAGNOSIS — N183 Chronic kidney disease, stage 3 unspecified: Secondary | ICD-10-CM

## 2020-07-23 DIAGNOSIS — I5032 Chronic diastolic (congestive) heart failure: Secondary | ICD-10-CM

## 2020-07-23 NOTE — Chronic Care Management (AMB) (Signed)
Chronic Care Management    Clinical Social Work Follow Up Note  07/23/2020 Name: Shannon [name] MRN: [id number] DOB: 1956-02-12  Shannon [name] is a 64 y.o. year old female who is a primary care patient of Shannon Falcon, MD. The CCM team was consulted for assistance with DME, housing concerns.   Review of patient status, including review of consultants reports, other relevant assessments, and collaboration with appropriate care team members and the patient's provider was performed as part of comprehensive patient evaluation and provision of chronic care management services.    SDOH (Social Determinants of Health) assessments performed: No    Outpatient Encounter Medications as of 07/23/2020  Medication Sig  . furosemide (LASIX) 20 MG tablet Take 1 tablet (20 mg total) by mouth daily. As needed  . XARELTO 20 MG TABS tablet TAKE 1 TABLET(20 MG) BY MOUTH DAILY WITH SUPPER  . acetaminophen (TYLENOL) 325 MG tablet Take 325 mg by mouth every 6 (six) hours as needed (FOR PAIN).  [name] albuterol (PROVENTIL) (2.5 MG/3ML) 0.083% nebulizer solution Take 3 mLs (2.5 mg total) by nebulization every 6 (six) hours as needed for wheezing or shortness of breath.  . Ascorbic Acid (VITAMIN C) 1000 MG tablet Take 1,000 mg by mouth every other day.   . b complex vitamins tablet Take 1 tablet by mouth daily.  [name] BIKTARVY 50-200-25 MG TABS tablet TAKE 1 TABLET BY MOUTH DAILY  . BIOTIN PO Take 1 tablet by mouth daily as needed (for supplementation).   . calcium carbonate (OSCAL) 1500 (600 Ca) MG TABS tablet Take 600 mg of elemental calcium by mouth daily with breakfast.  . Cholecalciferol (SM VITAMIN D3) 100 MCG (4000 UT) CAPS Take 1 capsule (4,000 Units total) by mouth daily.  . cloNIDine (CATAPRES) 0.1 MG tablet Take 1 tablet (0.1 mg total) by mouth every 12 (twelve) hours.  . cyanocobalamin (,VITAMIN B-12,) 1000 MCG/ML injection ADMINISTER 1 ML(1000 MCG) IN THE MUSCLE EVERY 30 DAYS  . diltiazem (CARDIZEM CD) 240  MG 24 hr capsule Take 1 capsule (240 mg total) by mouth daily.  [name] doxycycline (VIBRA-TABS) 100 MG tablet Take 1 tablet (100 mg total) by mouth 2 (two) times daily.  . fluticasone furoate-vilanterol (BREO ELLIPTA) 200-25 MCG/INH AEPB Inhale 1 puff into the lungs daily.  . furosemide (LASIX) 20 MG tablet Take 1 tablet (20 mg total) by mouth every other day.  [name] MAGNESIUM PO Take 1 tablet by mouth every other day.   . Melatonin 5 MG TABS Take 5 mg by mouth at bedtime as needed (for sleep).  . Misc. Devices (PULSE OXIMETER FOR FINGER) MISC 1 Units by Does not apply route as needed.  . Mouthwashes (BIOTENE DRY MOUTH GENTLE) LIQD Use as directed 1 Dose in the mouth or throat 2 (two) times daily as needed (dry mouth).  . Omega-3 Fatty Acids (FISH OIL) 1000 MG CAPS Take 1,000 mg by mouth daily as needed (pt prefrence).  [name] omeprazole (PRILOSEC) 40 MG capsule Take 1 capsule (40 mg total) by mouth daily as needed (heart burn).  . OXYGEN Inhale 1.5-2 L into the lungs See admin instructions. Uses at bedtime and through the day as needed  . polyethylene glycol (MIRALAX / GLYCOLAX) 17 g packet Take 17 g by mouth daily as needed for mild constipation.  [name] PREVIDENT 5000 SENSITIVE 1.1-5 % PSTE Use daily as directed  . PROAIR HFA 108 (90 Base) MCG/ACT inhaler INHALE 2 PUFFS INTO THE LUNGS EVERY 4 HOURS AS NEEDED FOR  WHEEZING OR SHORTNESS OF BREATH  . senna (SENOKOT) 8.6 MG tablet Take 1 tablet by mouth daily as needed for constipation.  [name] spironolactone (ALDACTONE) 25 MG tablet TAKE 1 TABLET(25 MG) BY MOUTH DAILY  . SYRINGE/NEEDLE, DISP, 1 ML (B-D SYRINGE/NEEDLE 1CC/25GX5/8) 25G X 5/8" 1 ML MISC 1 Units by Does not apply route every 30 (thirty) days.  . vitamin A 3 MG (10000 UNITS) capsule Take 10,000 Units by mouth daily.  [name] zinc gluconate 50 MG tablet Take 50 mg by mouth daily.   No facility-administered encounter medications on file as of 07/23/2020.     Goals Addressed              This Visit's Progress    .  "I need a lift chair and my O2 concentrator delivered. I am very frustrated because I can't get through to [employer]."" (pt-stated)        CARE PLAN ENTRY (see longitudinal plan of care for additional care plan information)  Current Barriers:  . Patient seeking lift chair and O2 concentrator.  Returned call to patient in response to message left at 1:32 pm today. Patient voiced appreciation for this CCM RN's effort in clarifying the O2 equipment and assisting with completion of the lift chair paperwork.   Clinical Social Work Clinical Goal(s):  [name] Over the next 90 days, patient will work with SW to address concerns related to need for DME/lift chair  Interventions: . Contacted patient to determine status of lift chair . Left message for Shannon [name] with [employer] requesting return call as patient states that lift chair was not approved for [employer] "processing department"   Patient Self Care Activities:  . Self administers medications as prescribed . Calls provider office for new concerns or questions . Unable to perform IADLs independently  Please see past updates related to this goal by clicking on the "Past Updates" button in the selected goal      .  "They are trying to kick me out of my townhouse under the RAD program and I may need documentation from my doctor that I have helath issues to keep me from being evicted." (pt-stated)        [place] (see longitudinal plan of care for additional care plan information)  Current Barriers:  . Care Coordination needs related to housing issues in a patient with HTN,CKD,HLD,Asthma,COPD,HF, CAD, HIV, new dx lung Ca- spoke with patient by phone, she was extremely upset as a piece of paper without a letterhead was slipped under her townhouse door indicating she has agreed to forfeit her section 8 housing voucher, she says the owners of her housing complex are trying to get the residents to move out so they can improve the units and  remove them from section 8 housing, she says she may need documentation from her clinic provider to assist with allowing her to stay in her current townhouse under Section 8 housing  Nurse Case Manager Clinical Goal(s):  [name] Over the next 90-120 days, patient will work with CCM team and clinic staff to address needs related to housing  Interventions:  . Inter-disciplinary care team collaboration (see longitudinal plan of care) . Provided over 30 minutes to allow patient to voice her concern about her housing situation . Collaborated with RNCM, Kelli Churn, regarding patient's request for letter/documentation of diagnoses and current treatments . In-basket message sent to Dr. Gilles Chiquito to inquire if she can compose letter or if patient should  formally request medical  records and/or schedule office visit to address need for documentation.  Patient Self Care Activities:  . Patient verbalizes understanding of plan to work with CCM team and clinic staff to address housing concerns . Unable to independently resolve housing issues  Please see past updates related to this goal by clicking on the "Past Updates" button in the selected goal          Follow Up Plan: SW will follow up with patient by phone over the next 7-10 days    [place], [place] [phone]

## 2020-07-23 NOTE — Progress Notes (Signed)
Internal Medicine Clinic Attending  CCM services provided by the care management provider and their documentation were discussed with Dr. Sherry Ruffing. We reviewed the pertinent findings, urgent action items addressed by the resident and non-urgent items to be addressed by the PCP.  I agree with the assessment, diagnosis, and plan of care documented in the CCM and resident's note.  Aldine Contes, MD 07/23/2020

## 2020-07-23 NOTE — Patient Instructions (Signed)
Licensed Clinical Social Worker Visit Information  Goals we discussed today:  Goals Addressed              This Visit's Progress   .  "I need a lift chair and my O2 concentrator delivered. I am very frustrated because I can't get through to [employer]."" (pt-stated)        CARE PLAN ENTRY (see longitudinal plan of care for additional care plan information)  Current Barriers:  . Patient seeking lift chair and O2 concentrator.  Returned call to patient in response to message left at 1:32 pm today. Patient voiced appreciation for this CCM RN's effort in clarifying the O2 equipment and assisting with completion of the lift chair paperwork.   Clinical Social Work Clinical Goal(s):  [name] Over the next 90 days, patient will work with SW to address concerns related to need for DME/lift chair  Interventions: . Contacted patient to determine status of lift chair . Left message for Lincoln Brigham with [employer] requesting return call as patient states that lift chair was not approved for [employer] "processing department"   Patient Self Care Activities:  . Self administers medications as prescribed . Calls provider office for new concerns or questions . Unable to perform IADLs independently  Please see past updates related to this goal by clicking on the "Past Updates" button in the selected goal      .  "They are trying to kick me out of my townhouse under the RAD program and I may need documentation from my doctor that I have helath issues to keep me from being evicted." (pt-stated)        [place] (see longitudinal plan of care for additional care plan information)  Current Barriers:  . Care Coordination needs related to housing issues in a patient with HTN,CKD,HLD,Asthma,COPD,HF, CAD, HIV, new dx lung Ca- spoke with patient by phone, she was extremely upset as a piece of paper without a letterhead was slipped under her townhouse door indicating she has agreed to forfeit her section 8  housing voucher, she says the owners of her housing complex are trying to get the residents to move out so they can improve the units and remove them from section 8 housing, she says she may need documentation from her clinic provider to assist with allowing her to stay in her current townhouse under Section 8 housing  Nurse Case Manager Clinical Goal(s):  [name] Over the next 90-120 days, patient will work with CCM team and clinic staff to address needs related to housing  Interventions:  . Inter-disciplinary care team collaboration (see longitudinal plan of care) . Provided over 30 minutes to allow patient to voice her concern about her housing situation . Collaborated with RNCM, Kelli Churn, regarding patient's request for letter/documentation of diagnoses and current treatments . In-basket message sent to Dr. Gilles Chiquito to inquire if she can compose letter or if patient should  formally request medical records and/or schedule office visit to address need for documentation.  Patient Self Care Activities:  . Patient verbalizes understanding of plan to work with CCM team and clinic staff to address housing concerns . Unable to independently resolve housing issues  Please see past updates related to this goal by clicking on the "Past Updates" button in the selected goal          Materials Provided: No: Patient declined  Follow Up Plan: SW will follow up with patient by phone over the next 7-10 days  Ronn Melena, [place] Coordination Social Worker [place] [phone]

## 2020-07-23 NOTE — Progress Notes (Signed)
Internal Medicine Clinic Resident  I have personally reviewed this encounter including the documentation in this note and/or discussed this patient with the care management provider. I will address any urgent items identified by the care management provider and will communicate my actions to the patient's PCP. I have reviewed the patient's CCM visit with my supervising attending, Dr Dareen Piano.  Shannon Noble, MD 07/23/2020

## 2020-07-23 NOTE — Telephone Encounter (Signed)
  Chronic Care Management   Outreach Note  07/23/2020 Name: TORUNN CHANCELLOR MRN: [id number] DOB: 03/19/1956  Referred by: [name], MD Reason for referral : Care Coordination (DME, Housing)   An unsuccessful telephone outreach was attempted today. The patient was referred to the case management team for assistance with care management and care coordination.   Follow Up Plan: A HIPAA compliant phone message was left for the patient providing contact information and requesting a return call.      Ronn Melena, [place] Coordination Social Worker [place] [phone]

## 2020-07-24 ENCOUNTER — Encounter: Payer: Self-pay | Admitting: Cardiovascular Disease

## 2020-07-24 DIAGNOSIS — R911 Solitary pulmonary nodule: Secondary | ICD-10-CM | POA: Diagnosis not present

## 2020-07-24 DIAGNOSIS — J45909 Unspecified asthma, uncomplicated: Secondary | ICD-10-CM | POA: Diagnosis not present

## 2020-07-24 DIAGNOSIS — J449 Chronic obstructive pulmonary disease, unspecified: Secondary | ICD-10-CM | POA: Diagnosis not present

## 2020-07-24 DIAGNOSIS — R269 Unspecified abnormalities of gait and mobility: Secondary | ICD-10-CM | POA: Diagnosis not present

## 2020-07-25 ENCOUNTER — Ambulatory Visit: Payer: Medicare Other | Admitting: Internal Medicine

## 2020-07-25 ENCOUNTER — Ambulatory Visit: Payer: Medicare Other

## 2020-07-25 DIAGNOSIS — I5032 Chronic diastolic (congestive) heart failure: Secondary | ICD-10-CM

## 2020-07-25 DIAGNOSIS — N183 Chronic kidney disease, stage 3 unspecified: Secondary | ICD-10-CM

## 2020-07-25 DIAGNOSIS — I10 Essential (primary) hypertension: Secondary | ICD-10-CM

## 2020-07-25 NOTE — Patient Instructions (Signed)
Licensed Clinical Social Worker Visit Information  Goals we discussed today:  Goals Addressed              This Visit's Progress   .  "I need a lift chair and my O2 concentrator delivered. I am very frustrated because I can't get through to [employer]."" (pt-stated)        CARE PLAN ENTRY (see longitudinal plan of care for additional care plan information)  Current Barriers:  . Patient seeking lift chair and O2 concentrator.  Returned call to patient in response to message left at 1:32 pm today. Patient voiced appreciation for this CCM RN's effort in clarifying the O2 equipment and assisting with completion of the lift chair paperwork.   Clinical Social Work Clinical Goal(s):  [name] Over the next 90 days, patient will work with SW to address concerns related to need for DME/lift chair  Interventions:  . Received communication from  Lincoln Brigham with [facility] that, based on Certificate of Medical Necessity, patient does not have a qualifying condition to meet Medicare eligibility for coverage of lift chair mechanism. Nash Dimmer with RNCM, Kelli Churn    Patient Self Care Activities:  . Self administers medications as prescribed . Calls provider office for new concerns or questions . Unable to perform IADLs independently  Please see past updates related to this goal by clicking on the "Past Updates" button in the selected goal            Follow Up Plan: SW will follow up with patient by phone over the next two weeks    [place], [place] [phone]

## 2020-07-25 NOTE — Chronic Care Management (AMB) (Signed)
Chronic Care Management    Clinical Social Work Follow Up Note  07/25/2020 Name: AZORA BONZO MRN: [id number] DOB: 03/10/1956  KLOI BRODMAN is a 64 y.o. year old female who is a primary care patient of [name], MD. The CCM team was consulted for assistance with DME.   Review of patient status, including review of consultants reports, other relevant assessments, and collaboration with appropriate care team members and the patient's provider was performed as part of comprehensive patient evaluation and provision of chronic care management services.    SDOH (Social Determinants of Health) assessments performed: No    Outpatient Encounter Medications as of 07/25/2020  Medication Sig  . furosemide (LASIX) 20 MG tablet Take 1 tablet (20 mg total) by mouth daily. As needed  . XARELTO 20 MG TABS tablet TAKE 1 TABLET(20 MG) BY MOUTH DAILY WITH SUPPER  . acetaminophen (TYLENOL) 325 MG tablet Take 325 mg by mouth every 6 (six) hours as needed (FOR PAIN).  [name] albuterol (PROVENTIL) (2.5 MG/3ML) 0.083% nebulizer solution Take 3 mLs (2.5 mg total) by nebulization every 6 (six) hours as needed for wheezing or shortness of breath.  . Ascorbic Acid (VITAMIN C) 1000 MG tablet Take 1,000 mg by mouth every other day.   . b complex vitamins tablet Take 1 tablet by mouth daily.  [name] BIKTARVY 50-200-25 MG TABS tablet TAKE 1 TABLET BY MOUTH DAILY  . BIOTIN PO Take 1 tablet by mouth daily as needed (for supplementation).   . calcium carbonate (OSCAL) 1500 (600 Ca) MG TABS tablet Take 600 mg of elemental calcium by mouth daily with breakfast.  . Cholecalciferol (SM VITAMIN D3) 100 MCG (4000 UT) CAPS Take 1 capsule (4,000 Units total) by mouth daily.  . cloNIDine (CATAPRES) 0.1 MG tablet Take 1 tablet (0.1 mg total) by mouth every 12 (twelve) hours.  . cyanocobalamin (,VITAMIN B-12,) 1000 MCG/ML injection ADMINISTER 1 ML(1000 MCG) IN THE MUSCLE EVERY 30 DAYS  . diltiazem (CARDIZEM CD) 240 MG 24 hr capsule  Take 1 capsule (240 mg total) by mouth daily.  [name] doxycycline (VIBRA-TABS) 100 MG tablet Take 1 tablet (100 mg total) by mouth 2 (two) times daily.  . fluticasone furoate-vilanterol (BREO ELLIPTA) 200-25 MCG/INH AEPB Inhale 1 puff into the lungs daily.  . furosemide (LASIX) 20 MG tablet Take 1 tablet (20 mg total) by mouth every other day.  [name] MAGNESIUM PO Take 1 tablet by mouth every other day.   . Melatonin 5 MG TABS Take 5 mg by mouth at bedtime as needed (for sleep).  . Misc. Devices (PULSE OXIMETER FOR FINGER) MISC 1 Units by Does not apply route as needed.  . Mouthwashes (BIOTENE DRY MOUTH GENTLE) LIQD Use as directed 1 Dose in the mouth or throat 2 (two) times daily as needed (dry mouth).  . Omega-3 Fatty Acids (FISH OIL) 1000 MG CAPS Take 1,000 mg by mouth daily as needed (pt prefrence).  [name] omeprazole (PRILOSEC) 40 MG capsule Take 1 capsule (40 mg total) by mouth daily as needed (heart burn).  . OXYGEN Inhale 1.5-2 L into the lungs See admin instructions. Uses at bedtime and through the day as needed  . polyethylene glycol (MIRALAX / GLYCOLAX) 17 g packet Take 17 g by mouth daily as needed for mild constipation.  [name] PREVIDENT 5000 SENSITIVE 1.1-5 % PSTE Use daily as directed  . PROAIR HFA 108 (90 Base) MCG/ACT inhaler INHALE 2 PUFFS INTO THE LUNGS EVERY 4 HOURS AS NEEDED FOR WHEEZING OR  SHORTNESS OF BREATH  . senna (SENOKOT) 8.6 MG tablet Take 1 tablet by mouth daily as needed for constipation.  [name] spironolactone (ALDACTONE) 25 MG tablet TAKE 1 TABLET(25 MG) BY MOUTH DAILY  . SYRINGE/NEEDLE, DISP, 1 ML (B-D SYRINGE/NEEDLE 1CC/25GX5/8) 25G X 5/8" 1 ML MISC 1 Units by Does not apply route every 30 (thirty) days.  . vitamin A 3 MG (10000 UNITS) capsule Take 10,000 Units by mouth daily.  [name] zinc gluconate 50 MG tablet Take 50 mg by mouth daily.   No facility-administered encounter medications on file as of 07/25/2020.     Goals Addressed              This Visit's Progress   .  "I need a  lift chair and my O2 concentrator delivered. I am very frustrated because I can't get through to [employer]."" (pt-stated)        CARE PLAN ENTRY (see longitudinal plan of care for additional care plan information)  Current Barriers:  . Patient seeking lift chair and O2 concentrator.  Returned call to patient in response to message left at 1:32 pm today. Patient voiced appreciation for this CCM RN's effort in clarifying the O2 equipment and assisting with completion of the lift chair paperwork.   Clinical Social Work Clinical Goal(s):  [name] Over the next 90 days, patient will work with SW to address concerns related to need for DME/lift chair  Interventions:  . Received communication from  Lincoln Brigham with [facility] that, based on Certificate of Medical Necessity, patient does not have a qualifying condition to meet Medicare eligibility for coverage of lift chair mechanism. Nash Dimmer with RNCM, Kelli Churn    Patient Self Care Activities:  . Self administers medications as prescribed . Calls provider office for new concerns or questions . Unable to perform IADLs independently  Please see past updates related to this goal by clicking on the "Past Updates" button in the selected goal          Follow Up Plan: SW will follow up with patient by phone over the next two weeks     [place], [place] [phone]

## 2020-07-25 NOTE — Progress Notes (Signed)
Internal Medicine Clinic Resident  I have personally reviewed this encounter including the documentation in this note and/or discussed this patient with the care management provider. I will address any urgent items identified by the care management provider and will communicate my actions to the patient's PCP. I have reviewed the patient's CCM visit with my supervising attending, Dr Rebeca Alert.  Jean Rosenthal, MD 07/25/2020

## 2020-08-02 ENCOUNTER — Encounter: Payer: Self-pay | Admitting: *Deleted

## 2020-08-02 NOTE — Progress Notes (Signed)

## 2020-08-05 ENCOUNTER — Telehealth: Payer: Self-pay | Admitting: Cardiovascular Disease

## 2020-08-05 ENCOUNTER — Ambulatory Visit: Payer: Medicare Other | Admitting: Internal Medicine

## 2020-08-05 NOTE — Telephone Encounter (Signed)
Patient called and stated that she wanted to talk with Dr. Oval Linsey or nurse regarding if she should start back on Xarelto or not. Please call back.

## 2020-08-05 NOTE — Telephone Encounter (Signed)
Salt water gargle.  Resume Xarelto when the bleeding stops.

## 2020-08-05 NOTE — Telephone Encounter (Signed)
Spoke with patient and she was asking about resuming her Xarelto  She had 3 teeth pulled around Thanksgiving and never resumed Xarelto  States that every time she brushes or touches the site it starts bleeding.  Advised patient to call surgeon and follow up on continued bleeding Will forward to Dr Oval Linsey regarding Xarelto

## 2020-08-07 ENCOUNTER — Telehealth: Payer: Self-pay | Admitting: *Deleted

## 2020-08-07 ENCOUNTER — Ambulatory Visit (INDEPENDENT_AMBULATORY_CARE_PROVIDER_SITE_OTHER): Payer: Medicare Other | Admitting: Student

## 2020-08-07 ENCOUNTER — Other Ambulatory Visit: Payer: Self-pay

## 2020-08-07 ENCOUNTER — Encounter: Payer: Self-pay | Admitting: Internal Medicine

## 2020-08-07 DIAGNOSIS — F411 Generalized anxiety disorder: Secondary | ICD-10-CM

## 2020-08-07 DIAGNOSIS — F419 Anxiety disorder, unspecified: Secondary | ICD-10-CM

## 2020-08-07 DIAGNOSIS — M940 Chondrocostal junction syndrome [Tietze]: Secondary | ICD-10-CM | POA: Diagnosis not present

## 2020-08-07 DIAGNOSIS — R911 Solitary pulmonary nodule: Secondary | ICD-10-CM

## 2020-08-07 DIAGNOSIS — M898X1 Other specified disorders of bone, shoulder: Secondary | ICD-10-CM | POA: Diagnosis not present

## 2020-08-07 NOTE — Progress Notes (Signed)
  [facility] Internal Medicine Residency Telephone Encounter Continuity Care Appointment  HPI:   This telephone encounter was created for [name] on 08/07/2020 for the following purpose/cc: left scapular pain   Past Medical History:  Past Medical History:  Diagnosis Date  . Anemia   . Anxiety    HX PANIC ATTACKS  . Arthritis    "starting to; in my hands" (07/09/2015)  . Asthma   . Atrial fibrillation ([place])   . Atrial flutter, paroxysmal ([place])   . Bloated abdomen   . CFS (chronic fatigue syndrome)   . Chewing difficulty   . Chronic asthma with acute exacerbation    "I have chronic asthma all the time; sometimes exacerbations" (07/09/2015)  . Chronic lower back pain   . COPD (chronic obstructive pulmonary disease) ([place])   . Cyst of right kidney    "3 of them; dx'd in ~ 01/2015"  . Dyspnea   . GERD (gastroesophageal reflux disease)   . Heart murmur   . History of blood transfusion    "related to my brain surgery I think"  . History of pulmonary embolism 07/09/2015  . HIV antibody positive ([place])   . HIV disease ([place])   . Hyperlipidemia   . Hypertension   . Leg edema   . Lipodystrophy   . Mild CAD 2013  . Multiple thyroid nodules   . Osteopenia   . Palpitations   . Pneumonia 07/09/2015  . Shingles   . Sleep apnea    "never completed part 2 of study; never wore mask" (07/09/2015)  . Vitamin B 12 deficiency   . Vitamin D deficiency       ROS:   As per HPI   Assessment / Plan / Recommendations:   Please see A&P under problem oriented charting for assessment of the patient's acute and chronic medical conditions.   As always, pt is advised that if symptoms worsen or new symptoms arise, they should go to an urgent care facility or to to ER for further evaluation.   Consent and Medical Decision Making:   Patient seen with Dr. Rebeca Alert  This is a telephone encounter between [name] and Sanjuan Dame on 08/07/2020 for L scapular pain. The visit  was conducted with the patient located at home and Sanjuan Dame at [facility]. The patient's identity was confirmed using their DOB and current address. The patient has consented to being evaluated through a telephone encounter and understands the associated risks (an examination cannot be done and the patient may need to come in for an appointment) / benefits (allows the patient to remain at home, decreasing exposure to coronavirus). I personally spent 10 minutes on medical discussion.

## 2020-08-07 NOTE — Telephone Encounter (Signed)
Called patient for tele visit intake questions. Hubbard Hartshorn, BSN, RN-BC

## 2020-08-07 NOTE — Telephone Encounter (Addendum)
Advised patient, verbalized understanding  

## 2020-08-08 ENCOUNTER — Other Ambulatory Visit: Payer: Self-pay

## 2020-08-08 ENCOUNTER — Ambulatory Visit (HOSPITAL_COMMUNITY)
Admission: RE | Admit: 2020-08-08 | Discharge: 2020-08-08 | Disposition: A | Discharge status: Home / Self Care | Payer: Medicare Other | Source: Ambulatory Visit | Attending: Internal Medicine | Admitting: Internal Medicine

## 2020-08-08 ENCOUNTER — Telehealth (INDEPENDENT_AMBULATORY_CARE_PROVIDER_SITE_OTHER): Payer: Medicare Other | Admitting: Internal Medicine

## 2020-08-08 DIAGNOSIS — M940 Chondrocostal junction syndrome [Tietze]: Secondary | ICD-10-CM | POA: Diagnosis not present

## 2020-08-08 DIAGNOSIS — C3492 Malignant neoplasm of unspecified part of left bronchus or lung: Secondary | ICD-10-CM | POA: Diagnosis not present

## 2020-08-08 DIAGNOSIS — R911 Solitary pulmonary nodule: Secondary | ICD-10-CM | POA: Diagnosis not present

## 2020-08-08 DIAGNOSIS — B20 Human immunodeficiency virus [HIV] disease: Secondary | ICD-10-CM

## 2020-08-08 NOTE — Progress Notes (Signed)
Internal Medicine Clinic Attending  CCM services provided by the care management provider and their documentation were reviewed with Dr. Eileen Stanford.  We reviewed the pertinent findings, urgent action items addressed by the resident and non-urgent items to be addressed by the PCP.  I agree with the assessment, diagnosis, and plan of care documented in the CCM and resident's note.  Oda Kilts, MD 08/08/2020

## 2020-08-08 NOTE — Progress Notes (Signed)
Virtual Visit via Telephone Note  I connected with Shannon [name] on 08/08/20 at  4:00 PM EST by telephone and verified that I am speaking with the correct person using two identifiers.  Location: Patient: at home Provider: in clinic   I discussed the limitations, risks, security and privacy concerns of performing an evaluation and management service by telephone and the availability of in person appointments. I also discussed with the patient that there may be a patient responsible charge related to this service. The patient expressed understanding and agreed to proceed.   History of Present Illness:    Observations/Objective:   Assessment and Plan: aki - needs to come for labs hiv disease- continue [facility] Health maintenance - needs booster Shoulder pain = has up coming CT to see if related to lung mass   Follow Up Instructions:    I discussed the assessment and treatment plan with the patient. The patient was provided an opportunity to ask questions and all were answered. The patient agreed with the plan and demonstrated an understanding of the instructions.   The patient was advised to call back or seek an in-person evaluation if the symptoms worsen or if the condition fails to improve as anticipated.  I provided 10 minutes of non-face-to-face time during this encounter.   Judyann Munson, MD

## 2020-08-09 ENCOUNTER — Telehealth: Payer: Self-pay

## 2020-08-09 ENCOUNTER — Encounter: Payer: Self-pay | Admitting: Student

## 2020-08-09 ENCOUNTER — Telehealth: Payer: Medicare Other

## 2020-08-09 ENCOUNTER — Telehealth: Payer: Self-pay | Admitting: Internal Medicine

## 2020-08-09 DIAGNOSIS — M898X1 Other specified disorders of bone, shoulder: Secondary | ICD-10-CM | POA: Insufficient documentation

## 2020-08-09 NOTE — Telephone Encounter (Signed)
08/09/20~Patient called & stated that she saw Dr [name] on Wed 08/07/20 and was referred to have a CXR. Patient also stated that she rcvd a call from Dr. [name] today advising her of an ENLARGEMENT / SWELLING to her LUNGS & that she should have the CT done sooner. Patient is complaining of severe Back Pain / Upper Left Shoulder Blade & SOB. Patient is having SEVERE SOB @ this time.L/M for Dr Julien Nordmann & Dr [name] via [place]. Advised patient to call 911 if unable to Breathe. There are 2 orders in Epic. 1 for CT CHEST W Contrast by Dr Julien Nordmann which is currently schd for 08/19/20 & other is CT CHEST W/O Contrast by Dr. Sondra Come that was placed 3 mths after Dr. Julien Nordmann. CORRECT order verification needed. Will f/u w Dr. Worthy Flank office on Monday morning. MF

## 2020-08-09 NOTE — Progress Notes (Signed)
Called and discussed result with Ms. Gartrell. Discussed mass appears to be larger, although difficult to definitively say without CT scan. Patient worried about waiting 10 days for scheduled CT. Discussed that this most likely isn't an acute issue, but she can verify with Dr. Lew Dawes office. She has scheduled appointment with Dr. Earlie Server on 08/21/20. Patient verbalized understanding and agreeable to plan.

## 2020-08-09 NOTE — Assessment & Plan Note (Signed)
Patient presenting via telephone for complaints of new left scapular pain. She states the pain has been present for the last week or so. Denies radiation of pain, fevers, chills, chest pain, acute changes in dyspnea. Given her history of lung cancer, patient is concerned for spread of disease and possible metastasis.   A/P: Will obtain CXR to further evaluate. Discussed with patient, she is planning on having CXR 12/16. - CXR ordered
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
## 2020-08-09 NOTE — Telephone Encounter (Signed)
  Chronic Care Management   Outreach Note  08/09/2020 Name: Shannon [name] MRN: [id number] DOB: 04/24/1956  Referred by: [name], MD Reason for referral : Shannon [name] is enrolled in a Managed [place]: No  An unsuccessful telephone outreach was attempted today. The patient was referred to the case management team for assistance with care management and care coordination.   Follow Up Plan: A HIPAA compliant phone message was left for the patient providing contact information and requesting a return call.       Ronn Melena, [place] Coordination Social Worker [place] [phone]

## 2020-08-12 DIAGNOSIS — R269 Unspecified abnormalities of gait and mobility: Secondary | ICD-10-CM | POA: Diagnosis not present

## 2020-08-12 DIAGNOSIS — J45909 Unspecified asthma, uncomplicated: Secondary | ICD-10-CM | POA: Diagnosis not present

## 2020-08-12 DIAGNOSIS — J449 Chronic obstructive pulmonary disease, unspecified: Secondary | ICD-10-CM | POA: Diagnosis not present

## 2020-08-12 DIAGNOSIS — G4733 Obstructive sleep apnea (adult) (pediatric): Secondary | ICD-10-CM | POA: Diagnosis not present

## 2020-08-13 ENCOUNTER — Ambulatory Visit: Payer: Medicare Other

## 2020-08-13 ENCOUNTER — Encounter: Payer: Self-pay | Admitting: Internal Medicine

## 2020-08-13 DIAGNOSIS — I5032 Chronic diastolic (congestive) heart failure: Secondary | ICD-10-CM

## 2020-08-13 DIAGNOSIS — I10 Essential (primary) hypertension: Secondary | ICD-10-CM

## 2020-08-13 DIAGNOSIS — N183 Chronic kidney disease, stage 3 unspecified: Secondary | ICD-10-CM

## 2020-08-13 NOTE — Chronic Care Management (AMB) (Signed)
Chronic Care Management    Clinical Social Work Follow Up Note  08/13/2020 Name: Shannon [name] MRN: [id number] DOB: Nov 01, 1955  Shannon [name] is a 64 y.o. year old female who is a primary care patient of [name], MD. The CCM team was consulted for assistance with disease management.   Review of patient status, including review of consultants reports, other relevant assessments, and collaboration with appropriate care team members and the patient's provider was performed as part of comprehensive patient evaluation and provision of chronic care management services.    SDOH (Social Determinants of Health) assessments performed: No    Outpatient Encounter Medications as of 08/13/2020  Medication Sig  . furosemide (LASIX) 20 MG tablet Take 1 tablet (20 mg total) by mouth daily. As needed  . XARELTO 20 MG TABS tablet TAKE 1 TABLET(20 MG) BY MOUTH DAILY WITH SUPPER  . acetaminophen (TYLENOL) 325 MG tablet Take 325 mg by mouth every 6 (six) hours as needed (FOR PAIN).  [name] albuterol (PROVENTIL) (2.5 MG/3ML) 0.083% nebulizer solution Take 3 mLs (2.5 mg total) by nebulization every 6 (six) hours as needed for wheezing or shortness of breath.  . Ascorbic Acid (VITAMIN C) 1000 MG tablet Take 1,000 mg by mouth every other day.   . b complex vitamins tablet Take 1 tablet by mouth daily.  [name] BIKTARVY 50-200-25 MG TABS tablet TAKE 1 TABLET BY MOUTH DAILY  . BIOTIN PO Take 1 tablet by mouth daily as needed (for supplementation).   . calcium carbonate (OSCAL) 1500 (600 Ca) MG TABS tablet Take 600 mg of elemental calcium by mouth daily with breakfast.  . Cholecalciferol (SM VITAMIN D3) 100 MCG (4000 UT) CAPS Take 1 capsule (4,000 Units total) by mouth daily.  . cloNIDine (CATAPRES) 0.1 MG tablet Take 1 tablet (0.1 mg total) by mouth every 12 (twelve) hours.  . cyanocobalamin (,VITAMIN B-12,) 1000 MCG/ML injection ADMINISTER 1 ML(1000 MCG) IN THE MUSCLE EVERY 30 DAYS  . diltiazem (CARDIZEM CD) 240 MG  24 hr capsule Take 1 capsule (240 mg total) by mouth daily.  [name] doxycycline (VIBRA-TABS) 100 MG tablet Take 1 tablet (100 mg total) by mouth 2 (two) times daily.  . fluticasone furoate-vilanterol (BREO ELLIPTA) 200-25 MCG/INH AEPB Inhale 1 puff into the lungs daily.  . furosemide (LASIX) 20 MG tablet Take 1 tablet (20 mg total) by mouth every other day.  [name] MAGNESIUM PO Take 1 tablet by mouth every other day.   . Melatonin 5 MG TABS Take 5 mg by mouth at bedtime as needed (for sleep).  . Misc. Devices (PULSE OXIMETER FOR FINGER) MISC 1 Units by Does not apply route as needed.  . Mouthwashes (BIOTENE DRY MOUTH GENTLE) LIQD Use as directed 1 Dose in the mouth or throat 2 (two) times daily as needed (dry mouth).  . Omega-3 Fatty Acids (FISH OIL) 1000 MG CAPS Take 1,000 mg by mouth daily as needed (pt prefrence).  [name] omeprazole (PRILOSEC) 40 MG capsule Take 1 capsule (40 mg total) by mouth daily as needed (heart burn).  . OXYGEN Inhale 1.5-2 L into the lungs See admin instructions. Uses at bedtime and through the day as needed  . polyethylene glycol (MIRALAX / GLYCOLAX) 17 g packet Take 17 g by mouth daily as needed for mild constipation.  [name] PREVIDENT 5000 SENSITIVE 1.1-5 % PSTE Use daily as directed  . PROAIR HFA 108 (90 Base) MCG/ACT inhaler INHALE 2 PUFFS INTO THE LUNGS EVERY 4 HOURS AS NEEDED FOR WHEEZING  OR SHORTNESS OF BREATH  . senna (SENOKOT) 8.6 MG tablet Take 1 tablet by mouth daily as needed for constipation.  [name] spironolactone (ALDACTONE) 25 MG tablet TAKE 1 TABLET(25 MG) BY MOUTH DAILY  . SYRINGE/NEEDLE, DISP, 1 ML (B-D SYRINGE/NEEDLE 1CC/25GX5/8) 25G X 5/8" 1 ML MISC 1 Units by Does not apply route every 30 (thirty) days.  . vitamin A 3 MG (10000 UNITS) capsule Take 10,000 Units by mouth daily.  [name] zinc gluconate 50 MG tablet Take 50 mg by mouth daily.   No facility-administered encounter medications on file as of 08/13/2020.     Goals Addressed              This Visit's Progress    .  "They are trying to kick me out of my townhouse under the RAD program and I may need documentation from my doctor that I have helath issues to keep me from being evicted." (pt-stated)        [place] (see longitudinal plan of care for additional care plan information)  Current Barriers:  . Care Coordination needs related to housing issues in a patient with HTN,CKD,HLD,Asthma,COPD,HF, CAD, HIV, new dx lung Ca- spoke with patient by phone, she was extremely upset as a piece of paper without a letterhead was slipped under her townhouse door indicating she has agreed to forfeit her section 8 housing voucher, she says the owners of her housing complex are trying to get the residents to move out so they can improve the units and remove them from section 8 housing, she says she may need documentation from her clinic provider to assist with allowing her to stay in her current townhouse under Section 8 housing  Nurse Case Manager Clinical Goal(s):  [name] Over the next 90-120 days, patient will work with CCM team and clinic staff to address needs related to housing  Interventions:  . Received letter from Dr. Gilles Chiquito that speaks to patient's diagnoses and current treatments.   . Mailed letter to patient.  . Attempted to contact patient for follow but she was resting at time of call and asked for call back at another time.   Patient Self Care Activities:  . Patient verbalizes understanding of plan to work with CCM team and clinic staff to address housing concerns . Unable to independently resolve housing issues  Please see past updates related to this goal by clicking on the "Past Updates" button in the selected goal          Follow Up Plan: Attempted to follow up with patient today but she was resting during time of call and requested call back at another time. Will attempt to reach her again tomorrow.    Ronn Melena, [place] Coordination Social Worker [place] [phone]

## 2020-08-13 NOTE — Progress Notes (Signed)
Things That May Be Affecting Your Health:  Alcohol  Hearing loss  Pain    Depression  Home Safety  Sexual Health   Diabetes X Lack of physical activity  Stress   Difficulty with daily activities  Loneliness  Tiredness   Drug use  Medicines  Tobacco use   Falls  Motor Vehicle Safety X Weight   Food choices  Oral Health  Other    YOUR PERSONALIZED HEALTH PLAN : 1. Schedule your next subsequent Medicare Wellness visit in one year 2. Attend all of your regular appointments to address your medical issues 3. Complete the preventative screenings and services   Annual Wellness Visit   Medicare Covered Preventative Screenings and [place] Men and Women Who How Often Need? Date of Last Service Action  Abdominal Aortic Aneurysm Adults with AAA risk factors Once     Alcohol Misuse and Counseling All Adults Screening once a year if no alcohol misuse. Counseling up to 4 face to face sessions.     Bone Density Measurement  Adults at risk for osteoporosis Once every 2 yrs Yes Never done   Lipid Panel Z13.6 All adults without CV disease Once every 5 yrs Yes 2019 elevated  Colorectal Cancer   Stool sample or  Colonoscopy All adults 42 and older   Once every year  Every 10 years Yes Due   Depression All Adults Once a year  Today   Diabetes Screening Blood glucose, post glucose load, or GTT Z13.1  All adults at risk  Pre-diabetics  Once per year  Twice per year Yes Pre-DM at last check   Diabetes  Self-Management Training All adults Diabetics 10 hrs first year; 2 hours subsequent years. Requires Copay     Glaucoma  Diabetics  Family history of glaucoma  African Americans 67 yrs +  Hispanic Americans 2 yrs + Annually - requires coppay Unknown, please ask    Hepatitis C Z72.89 or F19.20  High Risk for HCV  Born between 1945 and 1965  Annually  Once No    HIV Z11.4 All adults based on risk  Annually btw ages 31 & 81 regardless of risk  Annually > 65  yrs if at increased risk +, on therapy    Lung Cancer Screening Asymptomatic adults aged 23-77 with 30 pack yr history and current smoker OR quit within the last 15 yrs Annually Must have counseling and shared decision making documentation before first screen No  History of bronchogenic carcinoma, follows with oncology  Medical Nutrition Therapy Adults with   Diabetes  Renal disease  Kidney transplant within past 3 yrs 3 hours first year; 2 hours subsequent years     Obesity and Counseling All adults Screening once a year Counseling if BMI 30 or higher Yes Today   Tobacco Use Counseling Adults who use tobacco  Up to 8 visits in one year     Vaccines Z23  Hepatitis B  Influenza   Pneumonia  Adults   Once  Once every flu season  Two different vaccines separated by one year Yes  Flu, covid  Next Annual Wellness Visit People with Medicare Every year  Today     Services & Screenings Women Who How Often Need  Date of Last Service Action  Mammogram  Z12.31 Women over 88 One baseline ages 20-39. Annually ager 40 yrs+ Yes    Pap tests All women Annually if high risk. Every 2 yrs for normal risk women No  hysterectomy  Screening for cervical cancer with   Pap (Z01.419 nl or Z01.411abnl) &  HPV Z11.51 Women aged 6 to 7 Once every 5 yrs     Screening pelvic and breast exams All women Annually if high risk. Every 2 yrs for normal risk women     Sexually Transmitted Diseases  Chlamydia  Gonorrhea  Syphilis All at risk adults Annually for non pregnant females at increased risk         [place] Men Who How Ofter Need  Date of Last Service Action  Prostate Cancer - DRE & PSA Men over 50 Annually.  DRE might require a copay.     Sexually Transmitted Diseases  Syphilis All at risk adults Annually for men at increased risk

## 2020-08-13 NOTE — Progress Notes (Signed)
Internal Medicine Clinic Resident ° °I have personally reviewed this encounter including the documentation in this note and/or discussed this patient with the care management provider. I will address any urgent items identified by the care management provider and will communicate my actions to the patient's PCP. I have reviewed the patient's CCM visit with my supervising attending, Dr Narendra. ° °Abdias Hickam, DO °08/13/2020 ° ° °

## 2020-08-13 NOTE — Patient Instructions (Signed)
Licensed Clinical Social Worker Visit Information  Goals we discussed today:  Goals Addressed              This Visit's Progress   .  "They are trying to kick me out of my townhouse under the RAD program and I may need documentation from my doctor that I have helath issues to keep me from being evicted." (pt-stated)        [place] (see longitudinal plan of care for additional care plan information)  Current Barriers:  . Care Coordination needs related to housing issues in a patient with HTN,CKD,HLD,Asthma,COPD,HF, CAD, HIV, new dx lung Ca- spoke with patient by phone, she was extremely upset as a piece of paper without a letterhead was slipped under her townhouse door indicating she has agreed to forfeit her section 8 housing voucher, she says the owners of her housing complex are trying to get the residents to move out so they can improve the units and remove them from section 8 housing, she says she may need documentation from her clinic provider to assist with allowing her to stay in her current townhouse under Section 8 housing  Nurse Case Manager Clinical Goal(s):  [name] Over the next 90-120 days, patient will work with CCM team and clinic staff to address needs related to housing  Interventions:  . Received letter from Dr. Gilles Chiquito that speaks to patient's diagnoses and current treatments.   . Mailed letter to patient.  . Attempted to contact patient for follow but she was resting at time of call and asked for call back at another time.   Patient Self Care Activities:  . Patient verbalizes understanding of plan to work with CCM team and clinic staff to address housing concerns . Unable to independently resolve housing issues  Please see past updates related to this goal by clicking on the "Past Updates" button in the selected goal        Follow Up Plan: Attempted to follow up with patient today but she was resting during time of call and requested call back at another  time. Will attempt to reach her again tomorrow.    Ronn Melena, [place] Coordination Social Worker [place] [phone]

## 2020-08-14 ENCOUNTER — Encounter: Payer: Self-pay | Admitting: Cardiovascular Disease

## 2020-08-14 ENCOUNTER — Encounter: Payer: Self-pay | Admitting: Internal Medicine

## 2020-08-14 ENCOUNTER — Ambulatory Visit: Payer: Medicare Other

## 2020-08-14 ENCOUNTER — Telehealth: Payer: Self-pay

## 2020-08-14 DIAGNOSIS — I5032 Chronic diastolic (congestive) heart failure: Secondary | ICD-10-CM

## 2020-08-14 DIAGNOSIS — I10 Essential (primary) hypertension: Secondary | ICD-10-CM

## 2020-08-14 DIAGNOSIS — B20 Human immunodeficiency virus [HIV] disease: Secondary | ICD-10-CM

## 2020-08-14 DIAGNOSIS — N183 Chronic kidney disease, stage 3 unspecified: Secondary | ICD-10-CM

## 2020-08-14 NOTE — Telephone Encounter (Signed)
error 

## 2020-08-14 NOTE — Chronic Care Management (AMB) (Signed)
Chronic Care Management    Clinical Social Work Follow Up Note  08/14/2020 Name: Shannon [name] MRN: [id number] DOB: 04-09-1956  Shannon [name] is a 64 y.o. year old female who is a primary care patient of [name], MD. The CCM team was consulted for assistance with disease management.   Review of patient status, including review of consultants reports, other relevant assessments, and collaboration with appropriate care team members and the patient's provider was performed as part of comprehensive patient evaluation and provision of chronic care management services.    SDOH (Social Determinants of Health) assessments performed: No    Outpatient Encounter Medications as of 08/14/2020  Medication Sig  . furosemide (LASIX) 20 MG tablet Take 1 tablet (20 mg total) by mouth daily. As needed  . XARELTO 20 MG TABS tablet TAKE 1 TABLET(20 MG) BY MOUTH DAILY WITH SUPPER  . acetaminophen (TYLENOL) 325 MG tablet Take 325 mg by mouth every 6 (six) hours as needed (FOR PAIN).  [name] albuterol (PROVENTIL) (2.5 MG/3ML) 0.083% nebulizer solution Take 3 mLs (2.5 mg total) by nebulization every 6 (six) hours as needed for wheezing or shortness of breath.  . Ascorbic Acid (VITAMIN C) 1000 MG tablet Take 1,000 mg by mouth every other day.   . b complex vitamins tablet Take 1 tablet by mouth daily.  [name] BIKTARVY 50-200-25 MG TABS tablet TAKE 1 TABLET BY MOUTH DAILY  . BIOTIN PO Take 1 tablet by mouth daily as needed (for supplementation).   . calcium carbonate (OSCAL) 1500 (600 Ca) MG TABS tablet Take 600 mg of elemental calcium by mouth daily with breakfast.  . Cholecalciferol (SM VITAMIN D3) 100 MCG (4000 UT) CAPS Take 1 capsule (4,000 Units total) by mouth daily.  . cloNIDine (CATAPRES) 0.1 MG tablet Take 1 tablet (0.1 mg total) by mouth every 12 (twelve) hours.  . cyanocobalamin (,VITAMIN B-12,) 1000 MCG/ML injection ADMINISTER 1 ML(1000 MCG) IN THE MUSCLE EVERY 30 DAYS  . diltiazem (CARDIZEM CD) 240 MG  24 hr capsule Take 1 capsule (240 mg total) by mouth daily.  [name] doxycycline (VIBRA-TABS) 100 MG tablet Take 1 tablet (100 mg total) by mouth 2 (two) times daily.  . fluticasone furoate-vilanterol (BREO ELLIPTA) 200-25 MCG/INH AEPB Inhale 1 puff into the lungs daily.  . furosemide (LASIX) 20 MG tablet Take 1 tablet (20 mg total) by mouth every other day.  [name] MAGNESIUM PO Take 1 tablet by mouth every other day.   . Melatonin 5 MG TABS Take 5 mg by mouth at bedtime as needed (for sleep).  . Misc. Devices (PULSE OXIMETER FOR FINGER) MISC 1 Units by Does not apply route as needed.  . Mouthwashes (BIOTENE DRY MOUTH GENTLE) LIQD Use as directed 1 Dose in the mouth or throat 2 (two) times daily as needed (dry mouth).  . Omega-3 Fatty Acids (FISH OIL) 1000 MG CAPS Take 1,000 mg by mouth daily as needed (pt prefrence).  [name] omeprazole (PRILOSEC) 40 MG capsule Take 1 capsule (40 mg total) by mouth daily as needed (heart burn).  . OXYGEN Inhale 1.5-2 L into the lungs See admin instructions. Uses at bedtime and through the day as needed  . polyethylene glycol (MIRALAX / GLYCOLAX) 17 g packet Take 17 g by mouth daily as needed for mild constipation.  [name] PREVIDENT 5000 SENSITIVE 1.1-5 % PSTE Use daily as directed  . PROAIR HFA 108 (90 Base) MCG/ACT inhaler INHALE 2 PUFFS INTO THE LUNGS EVERY 4 HOURS AS NEEDED FOR WHEEZING  OR SHORTNESS OF BREATH  . senna (SENOKOT) 8.6 MG tablet Take 1 tablet by mouth daily as needed for constipation.  [name] spironolactone (ALDACTONE) 25 MG tablet TAKE 1 TABLET(25 MG) BY MOUTH DAILY  . SYRINGE/NEEDLE, DISP, 1 ML (B-D SYRINGE/NEEDLE 1CC/25GX5/8) 25G X 5/8" 1 ML MISC 1 Units by Does not apply route every 30 (thirty) days.  . vitamin A 3 MG (10000 UNITS) capsule Take 10,000 Units by mouth daily.  [name] zinc gluconate 50 MG tablet Take 50 mg by mouth daily.   No facility-administered encounter medications on file as of 08/14/2020.     Goals Addressed              This Visit's Progress    .  COMPLETED: "I need a lift chair and my O2 concentrator delivered. I am very frustrated because I can't get through to [employer]."" (pt-stated)        CARE PLAN ENTRY (see longitudinal plan of care for additional care plan information)  Current Barriers:  . Patient seeking lift chair and O2 concentrator.  Returned call to patient in response to message left at 1:32 pm today. Patient voiced appreciation for this CCM RN's effort in clarifying the O2 equipment and assisting with completion of the lift chair paperwork.   Clinical Social Work Clinical Goal(s):  [name] Over the next 90 days, patient will work with SW to address concerns related to need for DME/lift chair  Interventions:  . Informed patient that, per [place] representative, Lincoln Brigham, she does not have a qualifying condition for lift chair . Encouraged patient to contact Ms. McClenny for additional questions or to inquire about appeal . Ensured that patient received O2 concentrator . Collaborated with RNCM, Kelli Churn    Patient Self Care Activities:  . Self administers medications as prescribed . Calls provider office for new concerns or questions . Unable to perform IADLs independently  Please see past updates related to this goal by clicking on the "Past Updates" button in the selected goal      .  COMPLETED: "They are trying to kick me out of my townhouse under the RAD program and I may need documentation from my doctor that I have helath issues to keep me from being evicted." (pt-stated)        [place] (see longitudinal plan of care for additional care plan information)  Current Barriers:  . Care Coordination needs related to housing issues in a patient with HTN,CKD,HLD,Asthma,COPD,HF, CAD, HIV, new dx lung Ca- spoke with patient by phone, she was extremely upset as a piece of paper without a letterhead was slipped under her townhouse door indicating she has agreed to forfeit her section 8 housing voucher,  she says the owners of her housing complex are trying to get the residents to move out so they can improve the units and remove them from section 8 housing, she says she may need documentation from her clinic provider to assist with allowing her to stay in her current townhouse under Section 8 housing  Nurse Case Manager Clinical Goal(s):  [name] Over the next 90-120 days, patient will work with CCM team and clinic staff to address needs related to housing  Interventions:  . Informed patient that letter written by Dr. Daryll Drown outlining diagnoses and treatments was mailed to her residence yesterday. . Informed patient that if additional documentation is needed, this should be requested through medical records departments in provider offices.   Patient Self Care Activities:  . Patient  verbalizes understanding of plan to work with CCM team and clinic staff to address housing concerns . Unable to independently resolve housing issues  Please see past updates related to this goal by clicking on the "Past Updates" button in the selected goal          Follow Up Plan: RNCM follow up scheduled for 08/21/20   Ronn Melena, [place] Coordination Social Worker [place] [phone]

## 2020-08-14 NOTE — Patient Instructions (Signed)
Licensed Clinical Social Worker Visit Information  Goals we discussed today:  Goals Addressed              This Visit's Progress   .  COMPLETED: "I need a lift chair and my O2 concentrator delivered. I am very frustrated because I can't get through to [employer]."" (pt-stated)        CARE PLAN ENTRY (see longitudinal plan of care for additional care plan information)  Current Barriers:  . Patient seeking lift chair and O2 concentrator.  Returned call to patient in response to message left at 1:32 pm today. Patient voiced appreciation for this CCM RN's effort in clarifying the O2 equipment and assisting with completion of the lift chair paperwork.   Clinical Social Work Clinical Goal(s):  [name] Over the next 90 days, patient will work with SW to address concerns related to need for DME/lift chair  Interventions:  . Informed patient that, per [employer] representative, Lincoln Brigham, she does not have a qualifying condition for lift chair . Encouraged patient to contact Ms. McClenny for additional questions or to inquire about appeal . Ensured that patient received O2 concentrator . Collaborated with RNCM, Kelli Churn    Patient Self Care Activities:  . Self administers medications as prescribed . Calls provider office for new concerns or questions . Unable to perform IADLs independently  Please see past updates related to this goal by clicking on the "Past Updates" button in the selected goal      .  COMPLETED: "They are trying to kick me out of my townhouse under the RAD program and I may need documentation from my doctor that I have helath issues to keep me from being evicted." (pt-stated)        [place] (see longitudinal plan of care for additional care plan information)  Current Barriers:  . Care Coordination needs related to housing issues in a patient with HTN,CKD,HLD,Asthma,COPD,HF, CAD, HIV, new dx lung Ca- spoke with patient by phone, she was extremely upset as a  piece of paper without a letterhead was slipped under her townhouse door indicating she has agreed to forfeit her section 8 housing voucher, she says the owners of her housing complex are trying to get the residents to move out so they can improve the units and remove them from section 8 housing, she says she may need documentation from her clinic provider to assist with allowing her to stay in her current townhouse under [place] 8 housing  Nurse Case Manager Clinical Goal(s):  [name] Over the next 90-120 days, patient will work with CCM team and clinic staff to address needs related to housing  Interventions:  . Informed patient that letter written by Dr. Daryll Drown outlining diagnoses and treatments was mailed to her residence yesterday. . Informed patient that if additional documentation is needed, this should be requested through medical records departments in provider offices.   Patient Self Care Activities:  . Patient verbalizes understanding of plan to work with CCM team and clinic staff to address housing concerns . Unable to independently resolve housing issues  Please see past updates related to this goal by clicking on the "Past Updates" button in the selected goal          Follow Up Plan: RNCM follow up scheduled for 08/21/20   Ronn Melena, [place] Coordination Social Worker [place] [phone]

## 2020-08-14 NOTE — Telephone Encounter (Signed)
  Chronic Care Management   Outreach Note  08/14/2020 Name: Shannon [name] MRN: [id number] DOB: [birth year]  Referred by: [name], MD Reason for referral : [place] is enrolled in a Managed Medicaid Health Plan: No  Third unsuccessful telephone outreach was attempted today. The patient was referred to the case management team for assistance with care management and care coordination.    Follow Up Plan: A HIPAA compliant phone message was left for the patient providing contact information and requesting a return call.     Ronn Melena, [place] Coordination Social Worker [place] [phone]

## 2020-08-14 NOTE — Progress Notes (Signed)
Internal Medicine Clinic Resident  I have personally reviewed this encounter including the documentation in this note and/or discussed this patient with the care management provider. I will address any urgent items identified by the care management provider and will communicate my actions to the patient's PCP. I have reviewed the patient's CCM visit with my supervising attending, Dr Heber Craighead.  Jean Rosenthal, MD 08/14/2020

## 2020-08-15 ENCOUNTER — Other Ambulatory Visit: Payer: Self-pay

## 2020-08-15 ENCOUNTER — Telehealth: Payer: Medicare Other

## 2020-08-15 ENCOUNTER — Other Ambulatory Visit: Payer: Medicare Other

## 2020-08-15 DIAGNOSIS — B20 Human immunodeficiency virus [HIV] disease: Secondary | ICD-10-CM

## 2020-08-19 ENCOUNTER — Ambulatory Visit (HOSPITAL_COMMUNITY): Payer: Medicare Other

## 2020-08-19 ENCOUNTER — Telehealth: Payer: Self-pay

## 2020-08-19 ENCOUNTER — Inpatient Hospital Stay: Payer: Medicare Other

## 2020-08-19 NOTE — Telephone Encounter (Signed)
Pt LM stating she has a new lump on her back for over a week and wants to know if she needs an xray.  Pt has labs and CT scan tomorrow 12/28 and an OV with Dr. Julien Nordmann the following day 12/29. I spoke with Dr. Julien Nordmann and he advised the pt should get her labs and CT and see him Wednesday to review them and to be assessed.  I spoke with pt and advised as indicated. Pt states she thinks she may just have an abscess that will need to be drained because she has had this before. I advised the pt to schedule an appt with her PCP in the mean time just in case that is what it's determined to be. Pt expressed understanding of this information.

## 2020-08-19 NOTE — Telephone Encounter (Signed)
Patient called office today to update [name] she is having CT scan done tomorrow at 63 at [facility]. Will forward message to provider.

## 2020-08-20 ENCOUNTER — Inpatient Hospital Stay: Payer: Medicare Other | Attending: Internal Medicine

## 2020-08-20 ENCOUNTER — Other Ambulatory Visit: Payer: Self-pay

## 2020-08-20 ENCOUNTER — Emergency Department (HOSPITAL_COMMUNITY): Payer: Medicare Other

## 2020-08-20 ENCOUNTER — Encounter (HOSPITAL_COMMUNITY): Payer: Self-pay

## 2020-08-20 ENCOUNTER — Emergency Department (HOSPITAL_COMMUNITY)
Admission: EM | Admit: 2020-08-20 | Discharge: 2020-08-21 | Disposition: A | Discharge status: Home / Self Care | Payer: Medicare Other | Source: Home / Self Care | Attending: Emergency Medicine | Admitting: Emergency Medicine

## 2020-08-20 ENCOUNTER — Ambulatory Visit (HOSPITAL_COMMUNITY): Admission: RE | Admit: 2020-08-20 | Payer: Medicare Other | Source: Ambulatory Visit

## 2020-08-20 DIAGNOSIS — Z20822 Contact with and (suspected) exposure to covid-19: Secondary | ICD-10-CM | POA: Insufficient documentation

## 2020-08-20 DIAGNOSIS — L02212 Cutaneous abscess of back [any part, except buttock]: Secondary | ICD-10-CM | POA: Insufficient documentation

## 2020-08-20 DIAGNOSIS — Z21 Asymptomatic human immunodeficiency virus [HIV] infection status: Secondary | ICD-10-CM | POA: Insufficient documentation

## 2020-08-20 DIAGNOSIS — I2510 Atherosclerotic heart disease of native coronary artery without angina pectoris: Secondary | ICD-10-CM | POA: Insufficient documentation

## 2020-08-20 DIAGNOSIS — Z85118 Personal history of other malignant neoplasm of bronchus and lung: Secondary | ICD-10-CM | POA: Insufficient documentation

## 2020-08-20 DIAGNOSIS — I1310 Hypertensive heart and chronic kidney disease without heart failure, with stage 1 through stage 4 chronic kidney disease, or unspecified chronic kidney disease: Secondary | ICD-10-CM | POA: Insufficient documentation

## 2020-08-20 DIAGNOSIS — J449 Chronic obstructive pulmonary disease, unspecified: Secondary | ICD-10-CM | POA: Insufficient documentation

## 2020-08-20 DIAGNOSIS — M549 Dorsalgia, unspecified: Secondary | ICD-10-CM | POA: Diagnosis not present

## 2020-08-20 DIAGNOSIS — Z79899 Other long term (current) drug therapy: Secondary | ICD-10-CM | POA: Insufficient documentation

## 2020-08-20 DIAGNOSIS — I480 Paroxysmal atrial fibrillation: Secondary | ICD-10-CM | POA: Insufficient documentation

## 2020-08-20 DIAGNOSIS — C3412 Malignant neoplasm of upper lobe, left bronchus or lung: Secondary | ICD-10-CM | POA: Diagnosis not present

## 2020-08-20 DIAGNOSIS — N183 Chronic kidney disease, stage 3 unspecified: Secondary | ICD-10-CM | POA: Insufficient documentation

## 2020-08-20 DIAGNOSIS — R109 Unspecified abdominal pain: Secondary | ICD-10-CM | POA: Insufficient documentation

## 2020-08-20 DIAGNOSIS — M546 Pain in thoracic spine: Secondary | ICD-10-CM | POA: Insufficient documentation

## 2020-08-20 DIAGNOSIS — J4541 Moderate persistent asthma with (acute) exacerbation: Secondary | ICD-10-CM | POA: Insufficient documentation

## 2020-08-20 DIAGNOSIS — C3490 Malignant neoplasm of unspecified part of unspecified bronchus or lung: Secondary | ICD-10-CM | POA: Diagnosis not present

## 2020-08-20 DIAGNOSIS — Z7901 Long term (current) use of anticoagulants: Secondary | ICD-10-CM | POA: Insufficient documentation

## 2020-08-20 DIAGNOSIS — K219 Gastro-esophageal reflux disease without esophagitis: Secondary | ICD-10-CM | POA: Insufficient documentation

## 2020-08-20 DIAGNOSIS — I712 Thoracic aortic aneurysm, without rupture: Secondary | ICD-10-CM | POA: Diagnosis not present

## 2020-08-20 DIAGNOSIS — M5459 Other low back pain: Secondary | ICD-10-CM | POA: Diagnosis not present

## 2020-08-20 LAB — BASIC METABOLIC PANEL
Anion gap: 8 (ref 5–15)
BUN: 14 mg/dL (ref 8–23)
CO2: 33 mmol/L — ABNORMAL HIGH (ref 22–32)
Calcium: 10 mg/dL (ref 8.9–10.3)
Chloride: 100 mmol/L (ref 98–111)
Creatinine, Ser: 1.3 mg/dL — ABNORMAL HIGH (ref 0.44–1.00)
GFR, Estimated: 45 mL/min — ABNORMAL LOW (ref >60)
Glucose, Bld: 98 mg/dL (ref 70–99)
Potassium: 4.8 mmol/L (ref 3.5–5.1)
Sodium: 141 mmol/L (ref 135–145)

## 2020-08-20 LAB — CBC WITH DIFFERENTIAL/PLATELET
Abs Immature Granulocytes: 0.01 10*3/uL (ref 0.00–0.07)
Basophils Absolute: 0 10*3/uL (ref 0.0–0.1)
Basophils Relative: 0 %
Eosinophils Absolute: 0.1 10*3/uL (ref 0.0–0.5)
Eosinophils Relative: 2 %
HCT: 39.9 % (ref 36.0–46.0)
Hemoglobin: 12.5 g/dL (ref 12.0–15.0)
Immature Granulocytes: 0 %
Lymphocytes Relative: 22 %
Lymphs Abs: 1.3 10*3/uL (ref 0.7–4.0)
MCH: 30.6 pg (ref 26.0–34.0)
MCHC: 31.3 g/dL (ref 30.0–36.0)
MCV: 97.8 fL (ref 80.0–100.0)
Monocytes Absolute: 0.5 10*3/uL (ref 0.1–1.0)
Monocytes Relative: 9 %
Neutro Abs: 4 10*3/uL (ref 1.7–7.7)
Neutrophils Relative %: 67 %
Platelets: 183 10*3/uL (ref 150–400)
RBC: 4.08 MIL/uL (ref 3.87–5.11)
RDW: 12.7 % (ref 11.5–15.5)
WBC: 5.9 10*3/uL (ref 4.0–10.5)
nRBC: 0 % (ref 0.0–0.2)

## 2020-08-20 LAB — CMP (CANCER CENTER ONLY)
ALT: 20 U/L (ref 0–44)
AST: 24 U/L (ref 15–41)
Albumin: 3.7 g/dL (ref 3.5–5.0)
Alkaline Phosphatase: 81 U/L (ref 38–126)
Anion gap: 4 — ABNORMAL LOW (ref 5–15)
BUN: 14 mg/dL (ref 8–23)
CO2: 35 mmol/L — ABNORMAL HIGH (ref 22–32)
Calcium: 10.3 mg/dL (ref 8.9–10.3)
Chloride: 103 mmol/L (ref 98–111)
Creatinine: 1.41 mg/dL — ABNORMAL HIGH (ref 0.44–1.00)
GFR, Estimated: 41 mL/min — ABNORMAL LOW (ref >60)
Glucose, Bld: 121 mg/dL — ABNORMAL HIGH (ref 70–99)
Potassium: 5.1 mmol/L (ref 3.5–5.1)
Sodium: 142 mmol/L (ref 135–145)
Total Bilirubin: 0.4 mg/dL (ref 0.3–1.2)
Total Protein: 7.1 g/dL (ref 6.5–8.1)

## 2020-08-20 LAB — CBC WITH DIFFERENTIAL (CANCER CENTER ONLY)
Abs Immature Granulocytes: 0.01 10*3/uL (ref 0.00–0.07)
Basophils Absolute: 0 10*3/uL (ref 0.0–0.1)
Basophils Relative: 1 %
Eosinophils Absolute: 0.1 10*3/uL (ref 0.0–0.5)
Eosinophils Relative: 2 %
HCT: 39.3 % (ref 36.0–46.0)
Hemoglobin: 12.4 g/dL (ref 12.0–15.0)
Immature Granulocytes: 0 %
Lymphocytes Relative: 15 %
Lymphs Abs: 0.9 10*3/uL (ref 0.7–4.0)
MCH: 30.5 pg (ref 26.0–34.0)
MCHC: 31.6 g/dL (ref 30.0–36.0)
MCV: 96.8 fL (ref 80.0–100.0)
Monocytes Absolute: 0.5 10*3/uL (ref 0.1–1.0)
Monocytes Relative: 8 %
Neutro Abs: 4.5 10*3/uL (ref 1.7–7.7)
Neutrophils Relative %: 74 %
Platelet Count: 189 10*3/uL (ref 150–400)
RBC: 4.06 MIL/uL (ref 3.87–5.11)
RDW: 12.5 % (ref 11.5–15.5)
WBC Count: 6 10*3/uL (ref 4.0–10.5)
nRBC: 0 % (ref 0.0–0.2)

## 2020-08-20 LAB — RESP PANEL BY RT-PCR (FLU A&B, COVID) ARPGX2
Influenza A by PCR: NEGATIVE
Influenza B by PCR: NEGATIVE
SARS Coronavirus 2 by RT PCR: NEGATIVE

## 2020-08-20 MED ORDER — MORPHINE SULFATE (PF) 4 MG/ML IV SOLN
4.0000 mg | Freq: Once | INTRAVENOUS | Status: AC
Start: 2020-08-20 — End: 2020-08-20
  Administered 2020-08-20: 23:00:00 4 mg via INTRAVENOUS
  Filled 2020-08-20: qty 1

## 2020-08-20 MED ORDER — ONDANSETRON HCL 4 MG/2ML IJ SOLN
4.0000 mg | Freq: Once | INTRAMUSCULAR | Status: AC
  Administered 2020-08-20: 22:00:00 4 mg via INTRAVENOUS
  Filled 2020-08-20: qty 2

## 2020-08-20 MED ORDER — ALBUTEROL SULFATE HFA 108 (90 BASE) MCG/ACT IN AERS
4.0000 | INHALATION_SPRAY | Freq: Once | RESPIRATORY_TRACT | Status: AC
  Administered 2020-08-20: 22:00:00 4 via RESPIRATORY_TRACT
  Filled 2020-08-20: qty 6.7

## 2020-08-20 MED ORDER — SODIUM CHLORIDE 0.9 % IV BOLUS
500.0000 mL | Freq: Once | INTRAVENOUS | Status: AC
  Administered 2020-08-20: 23:00:00 500 mL via INTRAVENOUS

## 2020-08-20 MED ORDER — IOHEXOL 300 MG/ML  SOLN: 75.0000 mL | Freq: Once | INTRAMUSCULAR | Status: DC | PRN

## 2020-08-20 NOTE — ED Triage Notes (Signed)
Patient reports that she has had an abscess under the left breast area x 2 weeks and now the pain radiates into her mid back. Patient states she went to the [facility] to get a CT scan. Patient was brought to the ED for the same to r/o return of cancer as per patient.

## 2020-08-21 ENCOUNTER — Encounter (HOSPITAL_COMMUNITY): Payer: Self-pay

## 2020-08-21 ENCOUNTER — Inpatient Hospital Stay: Payer: Medicare Other | Admitting: Internal Medicine

## 2020-08-21 ENCOUNTER — Telehealth: Payer: Medicare Other

## 2020-08-21 ENCOUNTER — Telehealth: Payer: Self-pay | Admitting: *Deleted

## 2020-08-21 ENCOUNTER — Emergency Department (HOSPITAL_COMMUNITY): Payer: Medicare Other

## 2020-08-21 DIAGNOSIS — Z743 Need for continuous supervision: Secondary | ICD-10-CM | POA: Diagnosis not present

## 2020-08-21 DIAGNOSIS — I712 Thoracic aortic aneurysm, without rupture: Secondary | ICD-10-CM | POA: Diagnosis not present

## 2020-08-21 DIAGNOSIS — C3412 Malignant neoplasm of upper lobe, left bronchus or lung: Secondary | ICD-10-CM | POA: Diagnosis not present

## 2020-08-21 DIAGNOSIS — R52 Pain, unspecified: Secondary | ICD-10-CM | POA: Diagnosis not present

## 2020-08-21 DIAGNOSIS — R0602 Shortness of breath: Secondary | ICD-10-CM | POA: Diagnosis not present

## 2020-08-21 DIAGNOSIS — C3490 Malignant neoplasm of unspecified part of unspecified bronchus or lung: Secondary | ICD-10-CM | POA: Diagnosis not present

## 2020-08-21 DIAGNOSIS — M549 Dorsalgia, unspecified: Secondary | ICD-10-CM | POA: Diagnosis not present

## 2020-08-21 DIAGNOSIS — R279 Unspecified lack of coordination: Secondary | ICD-10-CM | POA: Diagnosis not present

## 2020-08-21 MED ORDER — MORPHINE SULFATE (PF) 4 MG/ML IV SOLN
4.0000 mg | Freq: Once | INTRAVENOUS | Status: AC
  Administered 2020-08-21: 02:00:00 4 mg via INTRAVENOUS
  Filled 2020-08-21: qty 1

## 2020-08-21 NOTE — ED Notes (Signed)
[facility] called for transport.  

## 2020-08-21 NOTE — ED Provider Notes (Signed)
  Physical Exam  BP 121/73   Pulse 79   Temp 98.4 F (36.9 C) (Oral)   Resp 19   Ht 5' 3.5" (1.613 m)   Wt 104.8 kg   SpO2 94%   BMI 40.28 kg/m   Physical Exam  ED Course/Procedures     Procedures  MDM  Received patient in signout.  Left flank/back pain.  Cancer history.  CT scan done reassuring.  No abscess see.has follow-up with Dr. Earlie Server later today       Davonna Belling, MD 08/21/20 0200

## 2020-08-21 NOTE — Discharge Instructions (Addendum)
Your CAT scan overall is reassuring.  Follow-up with Dr. Earlie Server as planned.

## 2020-08-21 NOTE — Progress Notes (Signed)
Internal Medicine Clinic Attending  CCM services provided by the care management provider and their documentation were discussed with Dr. Nguyen. We reviewed the pertinent findings, urgent action items addressed by the resident and non-urgent items to be addressed by the PCP.  I agree with the assessment, diagnosis, and plan of care documented in the CCM and resident's note.  Alicya Bena, MD 08/21/2020  

## 2020-08-21 NOTE — Telephone Encounter (Signed)
  Chronic Care Management   Outreach Note  08/21/2020 Name: Shannon [name] MRN: [id number] DOB: August 01, 1956  Referred by: [name], MD Reason for referral : Chronic Care Management (HTN,CKD,HLD,Asthma,COPD,HF, CAD, HIV, new dx lung Ca)   [name] is enrolled in a Managed [place]: No  An unsuccessful telephone outreach was attempted today. The patient was referred to the case management team for assistance with care management and care coordination.   Follow Up Plan: We have been unable to make contact with the patient for follow up. The care management team is available to follow up with the patient after provider conversation with the patient regarding recommendation for care management engagement and subsequent re-referral to the care management team.  The care management team will reach out to the patient again over the next 7-14 days.   Kelli Churn RN, CCM, [facility] RN Care Manager [phone]

## 2020-08-25 NOTE — Progress Notes (Incomplete)
Radiation Oncology         [phone] ________________________________  Name: Shannon [name] MRN: [id number]  Date: 08/26/2020  DOB: [birth year]  Follow-Up Visit Note  CC: [name], MD  [name], MD  No diagnosis found.  Diagnosis: Stage IA (T1c, N0, M0) non-small cell lung cancer of the left upper lobe, adenocarcinoma  Interval Since Last Radiation: Four months and four days  Radiation Treatment Dates: 04/09/2020 through 04/22/2020 Site Technique Total Dose (Gy) Dose per Fx (Gy) Completed Fx Beam Energies  Lung, Left: Lung_Lt SBRT 60/60 12 5/5 6XFFF    Narrative:  The patient returns today for routine follow-up. Since her last visit, she underwent a chest x-ray on 08/08/2020 that showed an enlarging left perihilar mass. CT scan of chest on 08/21/2020 showed a developing consolidation within the left upper lobe that almost certainly represented changes related to radiation therapy. The previously noted pulmonary mass was difficult to discretely measure separate from the area of consolidation, but appeared reduced in size in keeping with interval response to therapy. There was no evidence of regional normal or distant metastatic disease within the thorax. Chronic airway inflammation changes and dilation of the thoracic aorta were stable.  On review of systems, she reports ***. She denies ***.         ALLERGIES:  is allergic to tree extract, augmentin [amoxicillin-pot clavulanate], lisinopril, and ciprofloxacin.  Meds: Current Outpatient Medications  Medication Sig Dispense Refill  . acetaminophen (TYLENOL) 325 MG tablet Take 325 mg by mouth every 6 (six) hours as needed (FOR PAIN).    [name] albuterol (PROVENTIL) (2.5 MG/3ML) 0.083% nebulizer solution Take 3 mLs (2.5 mg total) by nebulization every 6 (six) hours as needed for wheezing or shortness of breath. 75 mL 3  . Ascorbic Acid (VITAMIN C) 1000 MG tablet Take 1,000 mg by mouth every other day.     . b complex vitamins  tablet Take 1 tablet by mouth daily.    [name] BIKTARVY 50-200-25 MG TABS tablet TAKE 1 TABLET BY MOUTH DAILY 30 tablet 5  . BIOTIN PO Take 1 tablet by mouth daily as needed (for supplementation).     . calcium carbonate (OSCAL) 1500 (600 Ca) MG TABS tablet Take 600 mg of elemental calcium by mouth daily with breakfast.    . Cholecalciferol (SM VITAMIN D3) 100 MCG (4000 UT) CAPS Take 1 capsule (4,000 Units total) by mouth daily. 30 capsule 0  . cloNIDine (CATAPRES) 0.1 MG tablet Take 1 tablet (0.1 mg total) by mouth every 12 (twelve) hours. 180 tablet 1  . cyanocobalamin (,VITAMIN B-12,) 1000 MCG/ML injection ADMINISTER 1 ML(1000 MCG) IN THE MUSCLE EVERY 30 DAYS (Patient taking differently: Inject 1,000 mcg into the muscle every 30 (thirty) days.) 3 mL 1  . diltiazem (CARDIZEM CD) 240 MG 24 hr capsule Take 1 capsule (240 mg total) by mouth daily. 90 capsule 3  . fluticasone furoate-vilanterol (BREO ELLIPTA) 200-25 MCG/INH AEPB Inhale 1 puff into the lungs daily. 1 each 5  . furosemide (LASIX) 20 MG tablet Take 1 tablet (20 mg total) by mouth every other day. (Patient not taking: Reported on 08/20/2020) 30 tablet 2  . furosemide (LASIX) 20 MG tablet Take 1 tablet (20 mg total) by mouth daily. As needed (Patient taking differently: Take 20 mg by mouth daily as needed for fluid.) 90 tablet 3  . MAGNESIUM PO Take 1 tablet by mouth every other day.     . Melatonin 5 MG TABS Take  5 mg by mouth at bedtime as needed (for sleep).    . Misc. Devices (PULSE OXIMETER FOR FINGER) MISC 1 Units by Does not apply route as needed. 1 each 0  . Mouthwashes (BIOTENE DRY MOUTH GENTLE) LIQD Use as directed 1 Dose in the mouth or throat 2 (two) times daily as needed (dry mouth).    . Omega-3 Fatty Acids (FISH OIL) 1000 MG CAPS Take 1,000 mg by mouth daily as needed (pt prefrence).    [name] omeprazole (PRILOSEC) 40 MG capsule Take 1 capsule (40 mg total) by mouth daily as needed (heart burn).    . OXYGEN Inhale 1.5-2 L into the  lungs See admin instructions. Uses at bedtime and through the day as needed    . polyethylene glycol (MIRALAX / GLYCOLAX) 17 g packet Take 17 g by mouth daily as needed for mild constipation. 14 each   . PREVIDENT 5000 SENSITIVE 1.1-5 % PSTE Use daily as directed  1  . PROAIR HFA 108 (90 Base) MCG/ACT inhaler INHALE 2 PUFFS INTO THE LUNGS EVERY 4 HOURS AS NEEDED FOR WHEEZING OR SHORTNESS OF BREATH (Patient taking differently: Inhale 2 puffs into the lungs every 4 (four) hours as needed for wheezing or shortness of breath.) 18 g 1  . senna (SENOKOT) 8.6 MG tablet Take 1 tablet by mouth daily as needed for constipation.    [name] spironolactone (ALDACTONE) 25 MG tablet TAKE 1 TABLET(25 MG) BY MOUTH DAILY (Patient taking differently: Take 25 mg by mouth daily.) 90 tablet 1  . SYRINGE/NEEDLE, DISP, 1 ML (B-D SYRINGE/NEEDLE 1CC/25GX5/8) 25G X 5/8" 1 ML MISC 1 Units by Does not apply route every 30 (thirty) days. 50 each 0  . vitamin A 3 MG (10000 UNITS) capsule Take 10,000 Units by mouth daily.    Alveda Reasons 20 MG TABS tablet TAKE 1 TABLET(20 MG) BY MOUTH DAILY WITH SUPPER (Patient taking differently: Take 20 mg by mouth daily.) 90 tablet 3  . zinc gluconate 50 MG tablet Take 50 mg by mouth daily.     No current facility-administered medications for this encounter.    Physical Findings: The patient is in no acute distress. Patient is alert and oriented.  vitals were not taken for this visit.  No significant changes. Lungs are clear to auscultation bilaterally. Heart has regular rate and rhythm. No palpable cervical, supraclavicular, or axillary adenopathy. Abdomen soft, non-tender, normal bowel sounds. ***  Lab Findings: Lab Results  Component Value Date   WBC 5.9 08/20/2020   HGB 12.5 08/20/2020   HCT 39.9 08/20/2020   MCV 97.8 08/20/2020   PLT 183 08/20/2020    Radiographic Findings: DG Chest 2 View  Result Date: 08/08/2020 CLINICAL DATA:  Known left lung carcinoma EXAM: CHEST - 2 VIEW  COMPARISON:  03/26/2020 FINDINGS: Cardiac shadow is mildly enlarged but stable. Right lung is clear. Fiducial markers are noted on the left with associated left perihilar mass lesion which appears more prominent than that seen on the prior exam. No bony abnormality is seen. IMPRESSION: Enlarging left perihilar mass. CT of the chest may be helpful for further evaluation. These results will be called to the ordering clinician or representative by the Radiologist Assistant, and communication documented in the PACS or [employer]. Electronically Signed   By: Inez Catalina M.D.   On: 08/08/2020 16:25   CT CHEST WO CONTRAST  Result Date: 08/21/2020 CLINICAL DATA:  Chest pain, back pain. Non-small cell lung cancer undergoing radiation therapy EXAM: CT CHEST WITHOUT  CONTRAST TECHNIQUE: Multidetector CT imaging of the chest was performed following the standard protocol without IV contrast. COMPARISON:  01/10/2020, PET-CT 01/25/2020 FINDINGS: Cardiovascular: Moderate multi-vessel coronary artery calcification. Mild global cardiomegaly, unchanged. No pericardial effusion. Central pulmonary arteries are enlarged in keeping with changes of pulmonary arterial hypertension. Stable dilation of the thoracic aorta, measuring 4.2 cm in diameter in its ascending segment, 3.4 cm in diameter within the arch just beyond the takeoff of the left subclavian artery, and 2.8 cm in diameter at the level of the left atrium. Mild superimposed atherosclerotic calcification within the thoracic aorta. Mediastinum/Nodes: No pathologic thoracic adenopathy. Esophagus unremarkable. Lungs/Pleura: There is developing consolidation within the left upper lobe likely reflecting changes of radiation therapy. Fiducial markers are noted within the left upper lobe. The previously noted pulmonary mass is difficult to discretely identify separate from the area of consolidation, particularly in absence of contrast administration. No new focal pulmonary  nodules or infiltrates. No pneumothorax or pleural effusion. There is diffuse bronchial wall thickening in keeping with changes of chronic airway inflammation. Minimal scarring at the right lung base is again noted. No central obstructing lesion. Upper Abdomen: Multiple hyperdense exophytic lesions arise from the upper pole of the visualized left kidney which appears stable since prior examinations of 11/22/2017 and likely represent multiple hyperdense renal cysts. Simple cortical cyst noted within the upper pole of the visualized right kidney. No acute abnormality within the visualized upper abdomen. Musculoskeletal: No lytic or blastic bone lesions are identified within the a thorax. IMPRESSION: No definite radiographic explanation for the patient's reported symptoms. Developing consolidation within the left upper lobe almost certainly representing changes related to radiation therapy. The previously noted pulmonary mass is now difficult to discretely measure separate from the area of consolidation but appears reduced in size in keeping with interval response to therapy. Discrete evaluation may be improved with contrast administration on subsequent examination. No evidence of regional normal or distant metastatic disease within the thorax. Stable changes of chronic airway inflammation. Stable dilation of the thoracic aorta. Recommend annual imaging followup by CTA or MRA. This recommendation follows 2010 ACCF/AHA/AATS/ACR/ASA/SCA/SCAI/SIR/STS/SVM Guidelines for the Diagnosis and Management of Patients with Thoracic Aortic Disease. Circulation. 2010; 121: M086-P619. Aortic aneurysm NOS (ICD10-I71.9) Aortic aneurysm NOS (ICD10-I71.9). Electronically Signed   By: Fidela Salisbury MD   On: 08/21/2020 01:46    Impression: Stage IA (T1c, N0, M0) non-small cell lung cancer of the left upper lobe, adenocarcinoma  Recent chest CT scan was relatively stable. ***  Plan: The patient will follow up with radiation oncology  in *** months.  Total time spent in this encounter was *** minutes which included reviewing the patient's most recent chest x-ray, chest CT scan, physical examination, and documentation. ____________________________________   Blair Promise, PhD, MD  This document serves as a record of services personally performed by Gery Pray, MD. It was created on his behalf by Clerance Lav, a trained medical scribe. The creation of this record is based on the scribe's personal observations and the provider's statements to them. This document has been checked and approved by the attending provider.

## 2020-08-26 ENCOUNTER — Ambulatory Visit
Admission: RE | Admit: 2020-08-26 | Discharge: 2020-08-26 | Disposition: A | Discharge status: Home / Self Care | Payer: Medicare Other | Source: Ambulatory Visit | Attending: Radiation Oncology | Admitting: Radiation Oncology

## 2020-08-26 ENCOUNTER — Telehealth: Payer: Self-pay

## 2020-08-26 ENCOUNTER — Telehealth: Payer: Self-pay | Admitting: *Deleted

## 2020-08-26 NOTE — Telephone Encounter (Signed)
CALLED PATIENT TO ASK ABOUT RESCHEDULING TODAY'S APPT., DUE TO HER NOT BE ABLE TO COME, RESCHEDULED FOR 09-09-20 @ 11:30 AM, PATIENT AGREED TO THIS TIME AND DATE

## 2020-08-26 NOTE — Progress Notes (Signed)
Already sent.

## 2020-08-26 NOTE — Telephone Encounter (Signed)
Patient called to cancel her appointment today due to the bad weather and transportation and power issues. She is requesting that Dr. Sondra Come call her with her CT results. She reports being in the ED on 08/20/2020 and has a '' cold in my back and under my left arm.'' Romie Jumper will call patient to r/s her appointment.

## 2020-08-27 ENCOUNTER — Other Ambulatory Visit: Payer: Self-pay | Admitting: Radiation Oncology

## 2020-08-27 ENCOUNTER — Other Ambulatory Visit: Payer: Self-pay

## 2020-08-27 ENCOUNTER — Telehealth: Payer: Self-pay

## 2020-08-27 ENCOUNTER — Telehealth: Payer: Self-pay | Admitting: *Deleted

## 2020-08-27 ENCOUNTER — Emergency Department (HOSPITAL_COMMUNITY): Payer: Medicare Other

## 2020-08-27 ENCOUNTER — Encounter (HOSPITAL_COMMUNITY): Payer: Self-pay | Admitting: Emergency Medicine

## 2020-08-27 ENCOUNTER — Other Ambulatory Visit: Payer: Self-pay | Admitting: Internal Medicine

## 2020-08-27 ENCOUNTER — Emergency Department (HOSPITAL_COMMUNITY)
Admission: EM | Admit: 2020-08-27 | Discharge: 2020-08-27 | Disposition: A | Discharge status: Home / Self Care | Payer: Medicare Other | Attending: Emergency Medicine | Admitting: Emergency Medicine

## 2020-08-27 DIAGNOSIS — J449 Chronic obstructive pulmonary disease, unspecified: Secondary | ICD-10-CM | POA: Insufficient documentation

## 2020-08-27 DIAGNOSIS — J45909 Unspecified asthma, uncomplicated: Secondary | ICD-10-CM | POA: Insufficient documentation

## 2020-08-27 DIAGNOSIS — J189 Pneumonia, unspecified organism: Secondary | ICD-10-CM

## 2020-08-27 DIAGNOSIS — Z20822 Contact with and (suspected) exposure to covid-19: Secondary | ICD-10-CM | POA: Insufficient documentation

## 2020-08-27 DIAGNOSIS — Z7901 Long term (current) use of anticoagulants: Secondary | ICD-10-CM | POA: Insufficient documentation

## 2020-08-27 DIAGNOSIS — I129 Hypertensive chronic kidney disease with stage 1 through stage 4 chronic kidney disease, or unspecified chronic kidney disease: Secondary | ICD-10-CM | POA: Diagnosis not present

## 2020-08-27 DIAGNOSIS — R059 Cough, unspecified: Secondary | ICD-10-CM | POA: Diagnosis present

## 2020-08-27 DIAGNOSIS — I2510 Atherosclerotic heart disease of native coronary artery without angina pectoris: Secondary | ICD-10-CM | POA: Insufficient documentation

## 2020-08-27 DIAGNOSIS — N183 Chronic kidney disease, stage 3 unspecified: Secondary | ICD-10-CM | POA: Diagnosis not present

## 2020-08-27 DIAGNOSIS — R0602 Shortness of breath: Secondary | ICD-10-CM | POA: Diagnosis not present

## 2020-08-27 DIAGNOSIS — Z21 Asymptomatic human immunodeficiency virus [HIV] infection status: Secondary | ICD-10-CM | POA: Insufficient documentation

## 2020-08-27 DIAGNOSIS — C3492 Malignant neoplasm of unspecified part of left bronchus or lung: Secondary | ICD-10-CM

## 2020-08-27 DIAGNOSIS — Z7951 Long term (current) use of inhaled steroids: Secondary | ICD-10-CM | POA: Diagnosis not present

## 2020-08-27 DIAGNOSIS — Z743 Need for continuous supervision: Secondary | ICD-10-CM | POA: Diagnosis not present

## 2020-08-27 DIAGNOSIS — R52 Pain, unspecified: Secondary | ICD-10-CM | POA: Diagnosis not present

## 2020-08-27 DIAGNOSIS — Z79899 Other long term (current) drug therapy: Secondary | ICD-10-CM | POA: Diagnosis not present

## 2020-08-27 DIAGNOSIS — G4489 Other headache syndrome: Secondary | ICD-10-CM | POA: Diagnosis not present

## 2020-08-27 DIAGNOSIS — R6889 Other general symptoms and signs: Secondary | ICD-10-CM | POA: Diagnosis not present

## 2020-08-27 LAB — URINALYSIS, ROUTINE W REFLEX MICROSCOPIC
Bilirubin Urine: NEGATIVE
Glucose, UA: NEGATIVE mg/dL
Hgb urine dipstick: NEGATIVE
Ketones, ur: NEGATIVE mg/dL
Leukocytes,Ua: NEGATIVE
Nitrite: NEGATIVE
Protein, ur: NEGATIVE mg/dL
Specific Gravity, Urine: 1.013 (ref 1.005–1.030)
pH: 7 (ref 5.0–8.0)

## 2020-08-27 LAB — CBC WITH DIFFERENTIAL/PLATELET
Abs Immature Granulocytes: 0.01 10*3/uL (ref 0.00–0.07)
Basophils Absolute: 0 10*3/uL (ref 0.0–0.1)
Basophils Relative: 0 %
Eosinophils Absolute: 0.1 10*3/uL (ref 0.0–0.5)
Eosinophils Relative: 2 %
HCT: 37.7 % (ref 36.0–46.0)
Hemoglobin: 12.3 g/dL (ref 12.0–15.0)
Immature Granulocytes: 0 %
Lymphocytes Relative: 19 %
Lymphs Abs: 1.3 10*3/uL (ref 0.7–4.0)
MCH: 31.7 pg (ref 26.0–34.0)
MCHC: 32.6 g/dL (ref 30.0–36.0)
MCV: 97.2 fL (ref 80.0–100.0)
Monocytes Absolute: 0.6 10*3/uL (ref 0.1–1.0)
Monocytes Relative: 10 %
Neutro Abs: 4.7 10*3/uL (ref 1.7–7.7)
Neutrophils Relative %: 69 %
Platelets: 190 10*3/uL (ref 150–400)
RBC: 3.88 MIL/uL (ref 3.87–5.11)
RDW: 12.9 % (ref 11.5–15.5)
WBC: 6.7 10*3/uL (ref 4.0–10.5)
nRBC: 0 % (ref 0.0–0.2)

## 2020-08-27 LAB — COMPREHENSIVE METABOLIC PANEL
ALT: 21 U/L (ref 0–44)
AST: 24 U/L (ref 15–41)
Albumin: 3.7 g/dL (ref 3.5–5.0)
Alkaline Phosphatase: 67 U/L (ref 38–126)
Anion gap: 8 (ref 5–15)
BUN: 18 mg/dL (ref 8–23)
CO2: 30 mmol/L (ref 22–32)
Calcium: 10.2 mg/dL (ref 8.9–10.3)
Chloride: 97 mmol/L — ABNORMAL LOW (ref 98–111)
Creatinine, Ser: 1.53 mg/dL — ABNORMAL HIGH (ref 0.44–1.00)
GFR, Estimated: 37 mL/min — ABNORMAL LOW (ref >60)
Glucose, Bld: 118 mg/dL — ABNORMAL HIGH (ref 70–99)
Potassium: 4.8 mmol/L (ref 3.5–5.1)
Sodium: 135 mmol/L (ref 135–145)
Total Bilirubin: 0.9 mg/dL (ref 0.3–1.2)
Total Protein: 7.2 g/dL (ref 6.5–8.1)

## 2020-08-27 LAB — LACTIC ACID, PLASMA: Lactic Acid, Venous: 1.2 mmol/L (ref 0.5–1.9)

## 2020-08-27 LAB — RESP PANEL BY RT-PCR (RSV, FLU A&B, COVID)  RVPGX2
Influenza A by PCR: NEGATIVE
Influenza B by PCR: NEGATIVE
Resp Syncytial Virus by PCR: NEGATIVE
SARS Coronavirus 2 by RT PCR: NEGATIVE

## 2020-08-27 MED ORDER — IPRATROPIUM BROMIDE HFA 17 MCG/ACT IN AERS
2.0000 | INHALATION_SPRAY | Freq: Once | RESPIRATORY_TRACT | Status: AC
  Administered 2020-08-27: 2 via RESPIRATORY_TRACT
  Filled 2020-08-27: qty 12.9

## 2020-08-27 MED ORDER — GUAIFENESIN 100 MG/5ML PO LIQD: 100.0000 mg | ORAL | 0 refills | Status: DC | PRN

## 2020-08-27 MED ORDER — IPRATROPIUM-ALBUTEROL 0.5-2.5 (3) MG/3ML IN SOLN
3.0000 mL | Freq: Once | RESPIRATORY_TRACT | Status: AC
  Administered 2020-08-27: 3 mL via RESPIRATORY_TRACT
  Filled 2020-08-27: qty 3

## 2020-08-27 MED ORDER — DOXYCYCLINE HYCLATE 100 MG PO CAPS: 100.0000 mg | ORAL_CAPSULE | Freq: Two times a day (BID) | ORAL | 0 refills | Status: AC

## 2020-08-27 MED ORDER — PREDNISONE 20 MG PO TABS: 20.0000 mg | ORAL_TABLET | Freq: Every day | ORAL | 0 refills | Status: AC

## 2020-08-27 MED ORDER — ALBUTEROL SULFATE HFA 108 (90 BASE) MCG/ACT IN AERS
2.0000 | INHALATION_SPRAY | Freq: Once | RESPIRATORY_TRACT | Status: AC
  Administered 2020-08-27: 2 via RESPIRATORY_TRACT
  Filled 2020-08-27: qty 6.7

## 2020-08-27 NOTE — ED Provider Notes (Signed)
[place] EMERGENCY DEPARTMENT Provider Note   CSN: [id number] Arrival date & time: 08/27/20  7510     History No chief complaint on file.   Shannon [name] is a 65 y.o. female.  HPI   Patient with significant medical history of anxiety, COPD, HIV, A. fib presents to the emergency department with chief complaint of URI-like symptoms.  Patient states her last few days she has had a chest cold which she feels on the right side, states she has been coughing more and feeling more fatigued.  She endorses that she is on supplemental oxygen at home and feels that the oxygen may have given her pneumonia.  She is currently vaccine against COVID-19, is immunocompromise with HIV.  Patient states she is tolerating p.o. without difficulty, has no other complaints at this time.  Patient denies headaches, chest pain, abdominal pain, nausea, vomiting, diarrhea, pedal edema.  Past Medical History:  Diagnosis Date  . Anemia   . Anxiety    HX PANIC ATTACKS  . Arthritis    "starting to; in my hands" (07/09/2015)  . Asthma   . Atrial fibrillation ([place])   . Atrial flutter, paroxysmal ([place])   . Bloated abdomen   . CFS (chronic fatigue syndrome)   . Chewing difficulty   . Chronic asthma with acute exacerbation    "I have chronic asthma all the time; sometimes exacerbations" (07/09/2015)  . Chronic lower back pain   . COPD (chronic obstructive pulmonary disease) ([place])   . Cyst of right kidney    "3 of them; dx'd in ~ 01/2015"  . Dyspnea   . GERD (gastroesophageal reflux disease)   . Heart murmur   . History of blood transfusion    "related to my brain surgery I think"  . History of pulmonary embolism 07/09/2015  . HIV antibody positive ([place])   . HIV disease ([place])   . Hyperlipidemia   . Hypertension   . Leg edema   . Lipodystrophy   . Mild CAD 2013  . Multiple thyroid nodules   . Osteopenia   . Palpitations   . Pneumonia 07/09/2015  . Shingles   . Sleep apnea    "never  completed part 2 of study; never wore mask" (07/09/2015)  . Vitamin B 12 deficiency   . Vitamin D deficiency     Patient Active Problem List   Diagnosis Date Noted  . Shoulder blade pain 08/09/2020  . Skin ulcer ([place]) 06/24/2020  . Bronchitis 03/07/2020  . Costochondritis, acute 03/07/2020  . Pulmonary nodule 02/14/2020  . Nodule of upper lobe of left lung 02/06/2020  . Thyroid nodule 01/26/2020  . Adenocarcinoma of lung ([place]) 01/04/2020  . Chronic obstructive asthma ([place]) 12/29/2019  . Myalgia 10/24/2019  . (HFpEF) heart failure with preserved ejection fraction ([place]) 02/07/2019  . Leg swelling 11/03/2018  . Other fatigue 06/30/2018  . Shortness of breath on exertion 06/30/2018  . Hyperglycemia 06/30/2018  . Vitamin D deficiency 06/30/2018  . Chronic anticoagulation 01/03/2018  . Chronic respiratory failure with hypoxia ([place]) 11/30/2017  . Constipation 11/03/2017  . Atrial flutter ([place]) 03/11/2017  . CAD (coronary artery disease) 01/28/2017  . Obesity (BMI 30-39.9) 01/13/2017  . Ascending aorta dilatation ([facility]) 01/07/2017  . Low back pain radiating to right lower extremity 12/02/2016  . Acquired cyst of kidney 12/02/2016  . Chronic kidney disease (CKD), stage III (moderate) ([place]) 12/01/2016  . Generalized anxiety disorder 10/22/2016  . Hypersomnia 10/01/2016  . Accessory skin tags 06/10/2016  .  Nocturnal hypoxemia 05/13/2016  . Cervical radiculopathy 04/14/2016  . GERD (gastroesophageal reflux disease) 01/02/2016  . Vitamin B12 deficiency 10/02/2015  . Moderate persistent asthma with exacerbation 07/09/2015  . HIV disease ([place]) 07/09/2015  . Uncontrolled hypertension 07/09/2015    Past Surgical History:  Procedure Laterality Date  . ABDOMINAL HYSTERECTOMY     "robotic laparosopic"  . BRAIN SURGERY  1974   "brain tumor; benign; on top of my brain; got a plate in there"  . BRAIN SURGERY     age 23- -"Tumor pushing my skullout"  . BRONCHIAL BIOPSY  02/13/2020    Procedure: BRONCHIAL BIOPSIES;  Surgeon: [name], MD;  Location: [facility] ENDOSCOPY;  Service: Pulmonary;;  . BRONCHIAL BRUSHINGS  02/13/2020   Procedure: BRONCHIAL BRUSHINGS;  Surgeon: [name], MD;  Location: [facility] ENDOSCOPY;  Service: Pulmonary;;  . BRONCHIAL NEEDLE ASPIRATION BIOPSY  02/13/2020   Procedure: BRONCHIAL NEEDLE ASPIRATION BIOPSIES;  Surgeon: [name], MD;  Location: [facility] ENDOSCOPY;  Service: Pulmonary;;  . CARDIAC CATHETERIZATION    . FIDUCIAL MARKER PLACEMENT  02/13/2020   Procedure: FIDUCIAL MARKER PLACEMENT;  Surgeon: [name], MD;  Location[facility] ENDOSCOPY;  Service: Pulmonary;;  . TONSILLECTOMY AND ADENOIDECTOMY    . VIDEO BRONCHOSCOPY WITH ENDOBRONCHIAL NAVIGATION N/A 02/13/2020   Procedure: VIDEO BRONCHOSCOPY WITH ENDOBRONCHIAL NAVIGATION;  Surgeon: [name], MD;  Location: [facility] ENDOSCOPY;  Service: Pulmonary;  Laterality: N/A;     OB History    Gravida  0   Para  0   Term  0   Preterm  0   AB  0   Living  0     SAB  0   IAB  0   Ectopic  0   Multiple  0   Live Births  0           Family History  Problem Relation Age of Onset  . Asthma Mother   . Heart failure Mother        cardiomyopathy  . Thyroid disease Mother   . Heart disease Mother   . Obesity Mother   . Hypertension Mother   . Heart murmur Sister   . Heart murmur Brother   . Diabetes Sister   . Thyroid disease Sister   . Heart murmur Sister     Social History   Tobacco Use  . Smoking status: Never Smoker  . Smokeless tobacco: Never Used  Vaping Use  . Vaping Use: Never used  Substance Use Topics  . Alcohol use: Not Currently    Alcohol/week: 0.0 standard drinks    Comment: Rarely.  . Drug use: No    Home Medications Prior to Admission medications   Medication Sig Start Date End Date Taking? Authorizing Provider  doxycycline (VIBRAMYCIN) 100 MG capsule Take 1 capsule (100 mg total) by mouth 2 (two) times daily for 7 days. 08/27/20 09/03/20 Yes  Marcello Fennel, PA-C  guaiFENesin (ROBITUSSIN) 100 MG/5ML liquid Take 5-10 mLs (100-200 mg total) by mouth every 4 (four) hours as needed for cough. 08/27/20  Yes Marcello Fennel, PA-C  predniSONE (DELTASONE) 20 MG tablet Take 1 tablet (20 mg total) by mouth daily for 5 days. 08/27/20 09/01/20 Yes Marcello Fennel, PA-C  acetaminophen (TYLENOL) 325 MG tablet Take 325 mg by mouth every 6 (six) hours as needed (FOR PAIN).    [provider]  albuterol (PROVENTIL) (2.5 MG/3ML) 0.083% nebulizer solution Take 3 mLs (2.5 mg total) by nebulization every 6 (six)  hours as needed for wheezing or shortness of breath. 04/24/20   Parrett, Fonnie Mu, NP  Ascorbic Acid (VITAMIN C) 1000 MG tablet Take 1,000 mg by mouth every other day.     [provider]  b complex vitamins tablet Take 1 tablet by mouth daily.    [provider]  BIKTARVY 50-200-25 MG TABS tablet TAKE 1 TABLET BY MOUTH DAILY 04/24/20   [name], MD  BIOTIN PO Take 1 tablet by mouth daily as needed (for supplementation).     [provider]  calcium carbonate (OSCAL) 1500 (600 Ca) MG TABS tablet Take 600 mg of elemental calcium by mouth daily with breakfast.    [provider]  Cholecalciferol (SM VITAMIN D3) 100 MCG (4000 UT) CAPS Take 1 capsule (4,000 Units total) by mouth daily. 04/18/19   Abby Potash, PA-C  cloNIDine (CATAPRES) 0.1 MG tablet Take 1 tablet (0.1 mg total) by mouth every 12 (twelve) hours. 04/02/20   Skeet Latch, MD  cyanocobalamin (,VITAMIN B-12,) 1000 MCG/ML injection ADMINISTER 1 ML(1000 MCG) IN THE MUSCLE EVERY 30 DAYS Patient taking differently: Inject 1,000 mcg into the muscle every 30 (thirty) days. 07/23/20   [name], MD  diltiazem (CARDIZEM CD) 240 MG 24 hr capsule Take 1 capsule (240 mg total) by mouth daily. 07/05/20   [name], MD  fluticasone furoate-vilanterol (BREO ELLIPTA) 200-25 MCG/INH AEPB Inhale 1 puff into the lungs daily. 02/05/20   Rigoberto Noel, MD  furosemide (LASIX) 20 MG tablet Take 1 tablet (20 mg total) by mouth every other day. Patient not taking: Reported on 08/20/2020 02/17/20 08/15/20  Mercy Riding, MD  furosemide (LASIX) 20 MG tablet Take 1 tablet (20 mg total) by mouth daily. As needed Patient taking differently: Take 20 mg by mouth daily as needed for fluid. 07/05/20   [name], MD  MAGNESIUM PO Take 1 tablet by mouth every other day.     [provider]  Melatonin 5 MG TABS Take 5 mg by mouth at bedtime as needed (for sleep).    [provider]  Misc. Devices (PULSE OXIMETER FOR FINGER) MISC 1 Units by Does not apply route as needed. 01/19/19   Skeet Latch, MD  Mouthwashes (BIOTENE DRY MOUTH GENTLE) LIQD Use as directed 1 Dose in the mouth or throat 2 (two) times daily as needed (dry mouth). 02/13/20   [name], MD  Omega-3 Fatty Acids (FISH OIL) 1000 MG CAPS Take 1,000 mg by mouth daily as needed (pt prefrence).    [provider]  omeprazole (PRILOSEC) 40 MG capsule Take 1 capsule (40 mg total) by mouth daily as needed (heart burn). 02/13/20   [name], MD  OXYGEN Inhale 1.5-2 L into the lungs See admin instructions. Uses at bedtime and through the day as needed    [provider]  polyethylene glycol (MIRALAX / GLYCOLAX) 17 g packet Take 17 g by mouth daily as needed for mild constipation. 06/28/20   [name], MD  PREVIDENT 5000 SENSITIVE 1.1-5 % PSTE Use daily as directed 01/24/18   [provider]  PROAIR HFA 108 (90 Base) MCG/ACT inhaler INHALE 2 PUFFS INTO THE LUNGS EVERY 4 HOURS AS NEEDED FOR WHEEZING OR SHORTNESS OF BREATH Patient taking differently: Inhale 2 puffs into the lungs every 4 (four) hours as needed for wheezing or shortness of breath. 07/09/20   [name], MD  senna (SENOKOT) 8.6 MG tablet Take 1 tablet  by mouth daily as needed for constipation.    [provider]  spironolactone (ALDACTONE) 25 MG tablet TAKE 1  TABLET(25 MG) BY MOUTH DAILY Patient taking differently: Take 25 mg by mouth daily. 07/23/20   [name], MD  SYRINGE/NEEDLE, DISP, 1 ML (B-D SYRINGE/NEEDLE 1CC/25GX5/8) 25G X 5/8" 1 ML MISC 1 Units by Does not apply route every 30 (thirty) days. 04/14/19   [name], MD  vitamin A 3 MG (10000 UNITS) capsule Take 10,000 Units by mouth daily.    [provider]  XARELTO 20 MG TABS tablet TAKE 1 TABLET(20 MG) BY MOUTH DAILY WITH SUPPER Patient taking differently: Take 20 mg by mouth daily. 07/05/20   [name], MD  zinc gluconate 50 MG tablet Take 50 mg by mouth daily.    [provider]    Allergies    Tree extract, Augmentin [amoxicillin-pot clavulanate], Lisinopril, and Ciprofloxacin  Review of Systems   Review of Systems  Constitutional: Negative for chills and fever.  HENT: Positive for congestion.   Respiratory: Positive for cough. Negative for shortness of breath.   Cardiovascular: Negative for chest pain.  Gastrointestinal: Negative for abdominal pain.  Genitourinary: Negative for enuresis.  Musculoskeletal: Negative for back pain.  Skin: Negative for rash.  Neurological: Negative for dizziness.  Hematological: Does not bruise/bleed easily.    Physical Exam Updated Vital Signs BP (!) 159/109 (BP Location: Left Arm)   Pulse (!) 102   Temp 98.5 F (36.9 C) (Oral)   Resp 20   Ht 5' 3.5" (1.613 m)   Wt 104.8 kg   SpO2 93%   BMI 40.28 kg/m   Physical Exam Vitals and nursing note reviewed.  Constitutional:      General: She is not in acute distress.    Appearance: She is not ill-appearing.  HENT:     Head: Normocephalic and atraumatic.     Nose: No congestion.     Mouth/Throat:     Mouth: Mucous membranes are moist.     Pharynx: Oropharynx is clear. No oropharyngeal exudate or posterior oropharyngeal erythema.  Eyes:     Conjunctiva/sclera: Conjunctivae normal.  Cardiovascular:     Rate and Rhythm: Normal rate and regular rhythm.      Pulses: Normal pulses.     Heart sounds: No murmur heard. No friction rub. No gallop.   Pulmonary:     Effort: No respiratory distress.     Breath sounds: No wheezing, rhonchi or rales.     Comments: Patient is mentating at mid to upper 90s while on room air, does not appear to be in respiratory distress, able to speak in full sentences.  She had tight sounding chest, with intermittent lower lobe wheezing, no rales, rhonchi or stridor noted. Abdominal:     Palpations: Abdomen is soft.     Tenderness: There is no abdominal tenderness.  Musculoskeletal:     Right lower leg: No edema.     Left lower leg: No edema.  Skin:    General: Skin is warm and dry.  Neurological:     Mental Status: She is alert.  Psychiatric:        Mood and Affect: Mood normal.     ED Results / Procedures / Treatments   Labs (all labs ordered are listed, but only abnormal results are displayed) Labs Reviewed  COMPREHENSIVE METABOLIC PANEL - Abnormal; Notable for the following components:      Result Value   Chloride 97 (*)  Glucose, Bld 118 (*)    Creatinine, Ser 1.53 (*)    GFR, Estimated 37 (*)    All other components within normal limits  URINALYSIS, ROUTINE W REFLEX MICROSCOPIC - Abnormal; Notable for the following components:   APPearance HAZY (*)    All other components within normal limits  RESP PANEL BY RT-PCR (RSV, FLU A&B, COVID)  RVPGX2  LACTIC ACID, PLASMA  CBC WITH DIFFERENTIAL/PLATELET  LACTIC ACID, PLASMA    EKG None  Radiology DG Chest 2 View  Result Date: 08/27/2020 CLINICAL DATA:  short of breath EXAM: CHEST - 2 VIEW COMPARISON:  08/21/2020 and prior. FINDINGS: Decreased conspicuity of left lung consolidation, likely reflecting post treatment sequela. Patchy right basilar opacities are new since prior exam. No pneumothorax or pleural effusion. Stable cardiomediastinal silhouette. Multilevel spondylosis. IMPRESSION: Patchy right basilar opacities, atelectasis versus  infiltrate. Post treatment appearance of the left lung. Electronically Signed   By: Primitivo Gauze M.D.   On: 08/27/2020 07:58    Procedures Procedures (including critical care time)  Medications Ordered in ED Medications  albuterol (VENTOLIN HFA) 108 (90 Base) MCG/ACT inhaler 2 puff (2 puffs Inhalation Given 08/27/20 1501)  ipratropium (ATROVENT HFA) inhaler 2 puff (2 puffs Inhalation Given 08/27/20 1502)  ipratropium-albuterol (DUONEB) 0.5-2.5 (3) MG/3ML nebulizer solution 3 mL (3 mLs Nebulization Given 08/27/20 1534)    ED Course  I have reviewed the triage vital signs and the nursing notes.  Pertinent labs & imaging results that were available during my care of the patient were reviewed by me and considered in my medical decision making (see chart for details).    MDM Rules/Calculators/A&P                          Patient presents with URI-like symptoms.  She is alert, does not appear in acute distress, vital signs reassuring.  We will obtain basic lab work-up, chest x-ray EKG and reevaluate.  Will provide patient with inhalers due to tight sounding chest.  Patient reassessed after following with DuoNeb, states she is feeling much better, lung sounds have improved does not sound as tight.  Vital signs have remained stable.  CBC negative for leukocytosis or signs of anemia.  CMP shows no electrolyte abnormalities, shows slight hyperglycemia of 118, slight elevation creatinine of 1.53, no elevated liver enzymes, no anion gap present.  Lactate is 1.2, UA negative for nitrates, leukocytes, hematuria.  Respiratory panel negative for influenza, Covid RSV.  Chest x-ray shows patchy right basilar opacity atelectasis versus infiltrate.  No other acute abnormalities noted.  Low suspicion for systemic infection as patient is nontoxic-appearing, vital signs reassuring, no obvious source infection noted on exam.  I have low suspicion for PE as patient denies pleuritic chest pain, shortness of  breath, no signs of pedal edema on my exam.  Patient does have noted tachycardia but I suspect this is more secondary to pneumonia as she has noted infiltrates on chest x-ray. Low suspicion for strep throat as oropharynx was visualized, no erythema or exudates noted.  Low suspicion patient would need to be  hospitalized due to viral infection or bacterial pneumonia as patient is nontoxic-appearing, vital signs reassuring, no new oxygen requirements on exam.  Will start patient on antibiotics and have her follow-up with her PCP for further evaluation.  Vital signs have remained stable, no indication for hospital admission.  Patient given at home care as well strict return precautions.  Patient verbalized that they understood  agreed to said plan.    Final Clinical Impression(s) / ED Diagnoses Final diagnoses:  Community acquired pneumonia of right lower lobe of lung    Rx / DC Orders ED Discharge Orders         Ordered    doxycycline (VIBRAMYCIN) 100 MG capsule  2 times daily        08/27/20 1603    predniSONE (DELTASONE) 20 MG tablet  Daily        08/27/20 1603    guaiFENesin (ROBITUSSIN) 100 MG/5ML liquid  Every 4 hours PRN        08/27/20 1603           Marcello Fennel, PA-C 08/27/20 1701    Daleen Bo, MD 08/28/20 [id number]

## 2020-08-27 NOTE — Telephone Encounter (Signed)
Called pt to see if she wanted to be seen in the clinic; stated after she had talked to me, the doctor saw her to let her know she has pneumonia. Stated she is being treated "great" and prefers to stay in the ED.

## 2020-08-27 NOTE — TOC Progression Note (Addendum)
Transition of Care [facility]) - Progression Note    Patient Details  Name: Shannon [name] MRN: [id number] Date of Birth: October 11, 1955  Transition of [facility]) CM/SW Contact  Erenest Rasher, RN Phone Number: [phone] 08/27/2020, 4:47 PM  Clinical Narrative:     TOC CM spoke to [facility] and her copay for her meds $0 and covered by Part D. Called in RX for Prednisone. Pharmacy did not have Rx. Pt will need to secure follow up appt with her PCP. Will notify pt. States she does see Dr Daryll Drown.     Barriers to Discharge: No Barriers Identified  Expected Discharge Plan and Services                                                 Social Determinants of Health (SDOH) Interventions    Readmission Risk Interventions No flowsheet data found.

## 2020-08-27 NOTE — Discharge Instructions (Addendum)
Seen here for a cough.  Lab work shows that you have right lobe pneumonia.  Start you on antibiotics please take as prescribed.  Recommend continuing with your medications as prescribed.  I would like you to follow-up at your primary care provider in 1 week's time for repeat chest x-ray.  Come back to the emergency department if you develop chest pain, shortness of breath, severe abdominal pain, uncontrolled nausea, vomiting, diarrhea.

## 2020-08-27 NOTE — Progress Notes (Signed)
TOC CM/CSW contacted RN/CM in regards to pt needing assistance with meds.  Elyna Pangilinan Tarpley-Carter, MSW, LCSW-A Pronouns:  She, Her, Hers                  [facility] ED Transitions of CareClinical Social Worker [email or web address] [phone]

## 2020-08-27 NOTE — ED Triage Notes (Signed)
To ED via [facility] from home-- c/o shortness of breath, "feels that O2 is putting cold into her body" seen at [facility] for same-- Headache--  Temp 101.2 -- today-- 210/116,

## 2020-08-27 NOTE — ED Notes (Signed)
Patient verbalizes understanding of discharge instructions. Opportunity for questioning and answers were provided. 

## 2020-08-27 NOTE — Telephone Encounter (Signed)
Looks like labs were drawn early this morning. So far they look reassuring, COVID-19 test is negative. Vitals look ok. We can see her down in clinic to help off load the ED. Please add her on to one of the resident schedules. Make sure she wears a mask while in clinic.

## 2020-08-27 NOTE — Telephone Encounter (Signed)
Call from pt stating she's in the ED and has been there since 6AM this morning. She wants to see a doctor here at our office. States she had to call EMS this morning who brought her to the ED; her Bp was 200/? and her HR was up. C/o mucous /cold in her back and side; she has tried OTC meds. States she has been coughing up phlegm. States she has been sitting in the ED; she needs abx's, steroids. Ans she does not want to sit there another 6 hrs. Told her I hate she has to wait for a long pd of time, but looks like the ED is in the process of doing a work-up with cxr, labs. I will ask The Attending to advise. Thanks

## 2020-08-27 NOTE — Telephone Encounter (Signed)
Received voicemail from patient requesting call back. RN called patient. Patient wants to make [name] aware that she is at [facility] ED for shortness of breath, chills, and fever. RN will route to provider.   Beryle Flock, RN

## 2020-08-27 NOTE — Telephone Encounter (Signed)
Thank you for taking care of Ms. Nakanishi Glenda and Dr. Darnell Level.

## 2020-08-28 ENCOUNTER — Ambulatory Visit: Payer: Medicare Other | Admitting: *Deleted

## 2020-08-28 DIAGNOSIS — N183 Chronic kidney disease, stage 3 unspecified: Secondary | ICD-10-CM

## 2020-08-28 DIAGNOSIS — B20 Human immunodeficiency virus [HIV] disease: Secondary | ICD-10-CM

## 2020-08-28 DIAGNOSIS — I10 Essential (primary) hypertension: Secondary | ICD-10-CM

## 2020-08-28 DIAGNOSIS — C3492 Malignant neoplasm of unspecified part of left bronchus or lung: Secondary | ICD-10-CM

## 2020-08-28 DIAGNOSIS — I5032 Chronic diastolic (congestive) heart failure: Secondary | ICD-10-CM

## 2020-08-28 NOTE — Patient Instructions (Signed)
Visit Information It was nice speaking with you today. Patient Care Plan: CCM RN- Cancer Treatment Phase (Adult)- patient diagnosed with lung cancer June 2021    Problem Identified: Patient lives alone with no family nearby with recent  diagnosis of lung cancer with numerous chronic disease states   Priority: High  Onset Date: 08/28/2020    Goal: Disease Progression Minimized or Managed   Start Date: 08/28/2020  Expected End Date: 11/21/2020  This Visit's Progress: On track  Priority: High  Note:   Current Barriers:  . Chronic Disease Management support and education needs related to [facility], CAD, HIV,  lung Ca diagnosed June 2021 . Unable to independently manage chronic disease states- patient reports 2 recent ED visits ( on 08/20/20 and 08/27/20) that resulted in very long waits and exposure to many sick people due to the Covid pandemic, patient also says her condo smells like fecal material because there was a sewage leak that stained her 1st floor ceiling and when the heat comes on the fecal smell  permeates her condo, she says her landlord an the city inspector do not feel it is a significant problem and all that needs to be done is to paint over the stained ceiling, she says the stress she has felt from the sewage leak and being diagnosed with pneumonia has brought her to tears at time, she says she intends to keep her 1st counseling appointment on 09/05/20 and will call today to make a follow up appointment with the clinic  Nurse Case Manager Clinical Goal(s):  [name] Over the next 30-60 days, patient will attend all scheduled medical appointments: related to chronic disease states . Over the next 30-60  days, the patient will demonstrate ongoing self health care management ability as evidenced by no unplanned hospital admissions   Interventions:  . 1:1 collaboration with [name], MD regarding development and update of comprehensive plan of care as evidenced by provider  attestation and co-signature . Inter-disciplinary care team collaboration (see longitudinal plan of care) . Evaluation of current treatment plan related to chronic disease states and patient's adherence to plan as established by provider. . Advised patient to call clinic and make follow up clinic appointment with CXR within next week per ED provider direction . Reviewed after visit summary from ED visit on 08/27/20 . Provided education to patient re: strategies to recover from CAP- take new medications as prescribed (Augmentin, prednisone and guaifenesin), rest, fluids not to exceed daily limit, keep feet elevate, monitor O2 sats and temperature and pulse . Reviewed medications with patient and discussed importance of completing course of antibiotic and prednisone  . Discussed plans with patient for ongoing care management follow up and provided patient with direct contact information for care management team . Reviewed scheduled/upcoming provider appointments including: with Dr snider on 09/02/20 and with Dr Theodis Shove for 1st counseling session (telehealth) on 09/05/20  Patient Goals/Self-Care Activities Over the next 30-60 days, patient will:  - Patient will self administer medications as prescribed Patient will attend all scheduled provider appointments Patient will call pharmacy for medication refills Patient will continue to perform ADL's independently Patient will continue to perform IADL's independently Patient will call provider office for new concerns or questions - eliminate COPD/Asthma symptom triggers at home - follow rescue plan if symptoms flare-up - keep follow-up appointments - use an extra pillow to sleep - arrange a ride through an agency 1 week before appointment - ask family or friend for a ride - call to  cancel if needed - keep a calendar with appointment dates - eat healthy - get a least 8 hours of sleep at night - use devices that will help like a cane, sock-puller or  reacher - use meditation or relaxation techniques - wash my hands with mild soap and water - wash my hands after using the bathroom - wash my hands after coughing, sneezing or blowing nose - stay away from people who are sick - begin personal counseling on 09/05/20 - talk about feelings with a friend, family or spiritual advisor - practice positive thinking and self-talk  Follow Up Plan: The care management team will reach out to the patient again over the next 30-60 days.          The patient verbalized understanding of instructions, educational materials, and care plan provided today and declined offer to receive copy of patient instructions, educational materials, and care plan.   The care management team will reach out to the patient again over the next 30-60 days.   Kelli Churn RN, CCM, [facility] RN Care Manager [phone]

## 2020-08-28 NOTE — Progress Notes (Signed)
Internal Medicine Clinic Resident  I have personally reviewed this encounter including the documentation in this note and/or discussed this patient with the care management provider. I will address any urgent items identified by the care management provider and will communicate my actions to the patient's PCP. I have reviewed the patient's CCM visit with my supervising attending, Dr Daryll Drown.  Iona Beard, MD 08/28/2020

## 2020-08-28 NOTE — Chronic Care Management (AMB) (Signed)
Chronic Care Management   Follow Up Note   08/28/2020 Name: Shannon [name] MRN: [id number] DOB: 06-Mar-1956  Referred by: [name], MD Reason for referral : Chronic Care Management (HTN,CKD,HLD,Asthma,COPD,HF, CAD, HIV, new dx lung Ca)   Shannon [name] is a 65 y.o. year old female who is a primary care patient of [name], MD. The CCM team was consulted for assistance with chronic disease management and care coordination needs.    Review of patient status, including review of consultants reports, relevant laboratory and other test results, and collaboration with appropriate care team members and the patient's provider was performed as part of comprehensive patient evaluation and provision of chronic care management services.    SDOH (Social Determinants of Health) assessments performed: No See Care Plan activities for detailed interventions related to [facility])     Outpatient Encounter Medications as of 08/28/2020  Medication Sig Note  . acetaminophen (TYLENOL) 325 MG tablet Take 325 mg by mouth every 6 (six) hours as needed (FOR PAIN).   [name] albuterol (PROVENTIL) (2.5 MG/3ML) 0.083% nebulizer solution Take 3 mLs (2.5 mg total) by nebulization every 6 (six) hours as needed for wheezing or shortness of breath.   . Ascorbic Acid (VITAMIN C) 1000 MG tablet Take 1,000 mg by mouth every other day.    . b complex vitamins tablet Take 1 tablet by mouth daily.   [name] BIKTARVY 50-200-25 MG TABS tablet TAKE 1 TABLET BY MOUTH DAILY   . BIOTIN PO Take 1 tablet by mouth daily as needed (for supplementation).    . calcium carbonate (OSCAL) 1500 (600 Ca) MG TABS tablet Take 600 mg of elemental calcium by mouth daily with breakfast.   . Cholecalciferol (SM VITAMIN D3) 100 MCG (4000 UT) CAPS Take 1 capsule (4,000 Units total) by mouth daily.   . cloNIDine (CATAPRES) 0.1 MG tablet Take 1 tablet (0.1 mg total) by mouth every 12 (twelve) hours.   . cyanocobalamin (,VITAMIN B-12,) 1000 MCG/ML injection  ADMINISTER 1 ML(1000 MCG) IN THE MUSCLE EVERY 30 DAYS (Patient taking differently: Inject 1,000 mcg into the muscle every 30 (thirty) days.)   . diltiazem (CARDIZEM CD) 240 MG 24 hr capsule Take 1 capsule (240 mg total) by mouth daily.   [name] doxycycline (VIBRAMYCIN) 100 MG capsule Take 1 capsule (100 mg total) by mouth 2 (two) times daily for 7 days.   . fluticasone furoate-vilanterol (BREO ELLIPTA) 200-25 MCG/INH AEPB Inhale 1 puff into the lungs daily.   . furosemide (LASIX) 20 MG tablet Take 1 tablet (20 mg total) by mouth every other day. (Patient not taking: Reported on 08/20/2020)   . furosemide (LASIX) 20 MG tablet Take 1 tablet (20 mg total) by mouth daily. As needed (Patient taking differently: Take 20 mg by mouth daily as needed for fluid.)   . guaiFENesin (ROBITUSSIN) 100 MG/5ML liquid Take 5-10 mLs (100-200 mg total) by mouth every 4 (four) hours as needed for cough.   [name] MAGNESIUM PO Take 1 tablet by mouth every other day.    . Melatonin 5 MG TABS Take 5 mg by mouth at bedtime as needed (for sleep).   . Misc. Devices (PULSE OXIMETER FOR FINGER) MISC 1 Units by Does not apply route as needed.   . Mouthwashes (BIOTENE DRY MOUTH GENTLE) LIQD Use as directed 1 Dose in the mouth or throat 2 (two) times daily as needed (dry mouth).   . Omega-3 Fatty Acids (FISH OIL) 1000 MG CAPS Take 1,000 mg by  mouth daily as needed (pt prefrence).   [name] omeprazole (PRILOSEC) 40 MG capsule Take 1 capsule (40 mg total) by mouth daily as needed (heart burn).   . OXYGEN Inhale 1.5-2 L into the lungs See admin instructions. Uses at bedtime and through the day as needed   . polyethylene glycol (MIRALAX / GLYCOLAX) 17 g packet Take 17 g by mouth daily as needed for mild constipation.   . predniSONE (DELTASONE) 20 MG tablet Take 1 tablet (20 mg total) by mouth daily for 5 days.   [name] PREVIDENT 5000 SENSITIVE 1.1-5 % PSTE Use daily as directed   . PROAIR HFA 108 (90 Base) MCG/ACT inhaler INHALE 2 PUFFS INTO THE LUNGS EVERY  4 HOURS AS NEEDED FOR WHEEZING OR SHORTNESS OF BREATH (Patient taking differently: Inhale 2 puffs into the lungs every 4 (four) hours as needed for wheezing or shortness of breath.)   . senna (SENOKOT) 8.6 MG tablet Take 1 tablet by mouth daily as needed for constipation.   [name] spironolactone (ALDACTONE) 25 MG tablet TAKE 1 TABLET(25 MG) BY MOUTH DAILY (Patient taking differently: Take 25 mg by mouth daily.)   . SYRINGE/NEEDLE, DISP, 1 ML (B-D SYRINGE/NEEDLE 1CC/25GX5/8) 25G X 5/8" 1 ML MISC 1 Units by Does not apply route every 30 (thirty) days.   . vitamin A 3 MG (10000 UNITS) capsule Take 10,000 Units by mouth daily.   Alveda Reasons 20 MG TABS tablet TAKE 1 TABLET(20 MG) BY MOUTH DAILY WITH SUPPER (Patient taking differently: Take 20 mg by mouth daily.) 08/20/2020: Unk time  . zinc gluconate 50 MG tablet Take 50 mg by mouth daily.    No facility-administered encounter medications on file as of 08/28/2020.     Objective:  Wt Readings from Last 3 Encounters:  08/27/20 231 lb (104.8 kg)  08/20/20 231 lb (104.8 kg)  07/03/20 238 lb 12.8 oz (108.3 kg)   Patient Care Plan: CCM RN- Cancer Treatment Phase (Adult)- patient diagnosed with lung cancer June 2021    Problem Identified: Patient lives alone with no family nearby with recent  diagnosis of lung cancer with numerous chronic disease states   Priority: High  Onset Date: 08/28/2020    Goal: Disease Progression Minimized or Managed   Start Date: 08/28/2020  Expected End Date: 11/21/2020  This Visit's Progress: On track  Priority: High  Note:   Current Barriers:  . Chronic Disease Management support and education needs related to [facility], CAD, HIV,  lung Ca diagnosed June 2021 . Unable to independently manage chronic disease states- patient reports 2 recent ED visits ( on 08/20/20 and 08/27/20) that resulted in very long waits and exposure to many sick people due to the Covid pandemic, patient also says her condo smells like fecal  material because there was a sewage leak that stained her 1st floor ceiling and when the heat comes on the fecal smell  permeates her condo, she says her landlord an the city inspector do not feel it is a significant problem and all that needs to be done is to paint over the stained ceiling, she says the stress she has felt from the sewage leak and being diagnosed with pneumonia has brought her to tears at time, she says she intends to keep her 1st counseling appointment on 09/05/20 and will call today to make a follow up appointment with the clinic  Nurse Case Manager Clinical Goal(s):  [name] Over the next 30-60 days, patient will attend all scheduled medical appointments: related to chronic  disease states . Over the next 30-60  days, the patient will demonstrate ongoing self health care management ability as evidenced by no unplanned hospital admissions   Interventions:  . 1:1 collaboration with [name], MD regarding development and update of comprehensive plan of care as evidenced by provider attestation and co-signature . Inter-disciplinary care team collaboration (see longitudinal plan of care) . Evaluation of current treatment plan related to chronic disease states and patient's adherence to plan as established by provider. . Advised patient to call clinic and make follow up clinic appointment with CXR within next week per ED provider direction . Reviewed after visit summary from ED visit on 08/27/20 . Provided education to patient re: strategies to recover from CAP- take new medications as prescribed (Augmentin, prednisone and guaifenesin), rest, fluids not to exceed daily limit, keep feet elevate, monitor O2 sats and temperature and pulse . Reviewed medications with patient and discussed importance of completing course of antibiotic and prednisone  . Discussed plans with patient for ongoing care management follow up and provided patient with direct contact information for care management  team . Reviewed scheduled/upcoming provider appointments including: with Dr snider on 09/02/20 and with Dr Theodis Shove for 1st counseling session (telehealth) on 09/05/20  Patient Goals/Self-Care Activities Over the next 30-60 days, patient will:  - Patient will self administer medications as prescribed Patient will attend all scheduled provider appointments Patient will call pharmacy for medication refills Patient will continue to perform ADL's independently Patient will continue to perform IADL's independently Patient will call provider office for new concerns or questions - eliminate COPD/Asthma symptom triggers at home - follow rescue plan if symptoms flare-up - keep follow-up appointments - use an extra pillow to sleep - arrange a ride through an agency 1 week before appointment - ask family or friend for a ride - call to cancel if needed - keep a calendar with appointment dates - eat healthy - get a least 8 hours of sleep at night - use devices that will help like a cane, sock-puller or reacher - use meditation or relaxation techniques - wash my hands with mild soap and water - wash my hands after using the bathroom - wash my hands after coughing, sneezing or blowing nose - stay away from people who are sick - begin personal counseling on 09/05/20 - talk about feelings with a friend, family or spiritual advisor - practice positive thinking and self-talk         The care management team will reach out to the patient again over the next 30-60 days.    Kelli Churn RN, CCM, [facility] RN Care Manager [phone]

## 2020-08-28 NOTE — Progress Notes (Signed)
Internal Medicine Clinic Attending  Case discussed with Dr. [name] at the time of the visit.  We reviewed the resident's history and exam and pertinent patient test results.  I agree with the assessment, diagnosis, and plan of care documented in the resident's note.  Lenice Pressman, M.D., Ph.D.

## 2020-08-29 NOTE — Progress Notes (Signed)
Internal Medicine Clinic Attending  CCM services provided by the care management provider and their documentation were discussed with Dr. Lisabeth Devoid. We reviewed the pertinent findings, urgent action items addressed by the resident and non-urgent items to be addressed by the PCP.  I agree with the assessment, diagnosis, and plan of care documented in the CCM and resident's note.  Gilles Chiquito, MD 08/29/2020

## 2020-09-01 NOTE — ED Provider Notes (Signed)
[facility] DEPT Provider Note   CSN: [id number] Arrival date & time: 08/20/20  1314     History Chief Complaint  Patient presents with  . Abscess    Shannon [name] is a 65 y.o. female.  Patient is a 65 year old female with history of hypertension, asthma, atrial fibrillation, COPD, and lung cancer.  Patient presents today for evaluation of pain to her left flank upper back.  This began in the absence of any injury or trauma and has been ongoing for the past 2 days.  Patient was to have a CT scan in the near future ordered by her oncologist, but was referred here due to the recurrence of pain.  She denies any difficulty breathing.  She denies fevers or chills.  She denies bowel or bladder complaints.  The history is provided by the patient.       Past Medical History:  Diagnosis Date  . Anemia   . Anxiety    HX PANIC ATTACKS  . Arthritis    "starting to; in my hands" (07/09/2015)  . Asthma   . Atrial fibrillation ([place])   . Atrial flutter, paroxysmal ([place])   . Bloated abdomen   . CFS (chronic fatigue syndrome)   . Chewing difficulty   . Chronic asthma with acute exacerbation    "I have chronic asthma all the time; sometimes exacerbations" (07/09/2015)  . Chronic lower back pain   . COPD (chronic obstructive pulmonary disease) ([place])   . Cyst of right kidney    "3 of them; dx'd in ~ 01/2015"  . Dyspnea   . GERD (gastroesophageal reflux disease)   . Heart murmur   . History of blood transfusion    "related to my brain surgery I think"  . History of pulmonary embolism 07/09/2015  . HIV antibody positive ([place])   . HIV disease ([place])   . Hyperlipidemia   . Hypertension   . Leg edema   . Lipodystrophy   . Mild CAD 2013  . Multiple thyroid nodules   . Osteopenia   . Palpitations   . Pneumonia 07/09/2015  . Shingles   . Sleep apnea    "never completed part 2 of study; never wore mask" (07/09/2015)  . Vitamin B 12 deficiency   . Vitamin  D deficiency     Patient Active Problem List   Diagnosis Date Noted  . Shoulder blade pain 08/09/2020  . Skin ulcer ([place]) 06/24/2020  . Bronchitis 03/07/2020  . Costochondritis, acute 03/07/2020  . Pulmonary nodule 02/14/2020  . Nodule of upper lobe of left lung 02/06/2020  . Thyroid nodule 01/26/2020  . Adenocarcinoma of lung ([place]) 01/04/2020  . Chronic obstructive asthma ([place]) 12/29/2019  . Myalgia 10/24/2019  . (HFpEF) heart failure with preserved ejection fraction ([place]) 02/07/2019  . Leg swelling 11/03/2018  . Other fatigue 06/30/2018  . Shortness of breath on exertion 06/30/2018  . Hyperglycemia 06/30/2018  . Vitamin D deficiency 06/30/2018  . Chronic anticoagulation 01/03/2018  . Chronic respiratory failure with hypoxia ([place]) 11/30/2017  . Constipation 11/03/2017  . Atrial flutter ([place]) 03/11/2017  . CAD (coronary artery disease) 01/28/2017  . Obesity (BMI 30-39.9) 01/13/2017  . Ascending aorta dilatation (HCC) 01/07/2017  . Low back pain radiating to right lower extremity 12/02/2016  . Acquired cyst of kidney 12/02/2016  . Chronic kidney disease (CKD), stage III (moderate) ([place]) 12/01/2016  . Generalized anxiety disorder 10/22/2016  . Hypersomnia 10/01/2016  . Accessory skin tags 06/10/2016  . Nocturnal hypoxemia  05/13/2016  . Cervical radiculopathy 04/14/2016  . GERD (gastroesophageal reflux disease) 01/02/2016  . Vitamin B12 deficiency 10/02/2015  . Moderate persistent asthma with exacerbation 07/09/2015  . HIV disease ([place]) 07/09/2015  . Uncontrolled hypertension 07/09/2015    Past Surgical History:  Procedure Laterality Date  . ABDOMINAL HYSTERECTOMY     "robotic laparosopic"  . BRAIN SURGERY  1974   "brain tumor; benign; on top of my brain; got a plate in there"  . BRAIN SURGERY     age 15- -"Tumor pushing my skullout"  . BRONCHIAL BIOPSY  02/13/2020   Procedure: BRONCHIAL BIOPSIES;  Surgeon: [name], MD;  Location: [facility] ENDOSCOPY;  Service:  Pulmonary;;  . BRONCHIAL BRUSHINGS  02/13/2020   Procedure: BRONCHIAL BRUSHINGS;  Surgeon: [name], MD;  Location: [facility] ENDOSCOPY;  Service: Pulmonary;;  . BRONCHIAL NEEDLE ASPIRATION BIOPSY  02/13/2020   Procedure: BRONCHIAL NEEDLE ASPIRATION BIOPSIES;  Surgeon: [name], MD;  Location: [facility] ENDOSCOPY;  Service: Pulmonary;;  . CARDIAC CATHETERIZATION    . FIDUCIAL MARKER PLACEMENT  02/13/2020   Procedure: FIDUCIAL MARKER PLACEMENT;  Surgeon: [name], MD;  Location: [facility] ENDOSCOPY;  Service: Pulmonary;;  . TONSILLECTOMY AND ADENOIDECTOMY    . VIDEO BRONCHOSCOPY WITH ENDOBRONCHIAL NAVIGATION N/A 02/13/2020   Procedure: VIDEO BRONCHOSCOPY WITH ENDOBRONCHIAL NAVIGATION;  Surgeon: [name], MD;  Location: [facility] ENDOSCOPY;  Service: Pulmonary;  Laterality: N/A;     OB History    Gravida  0   Para  0   Term  0   Preterm  0   AB  0   Living  0     SAB  0   IAB  0   Ectopic  0   Multiple  0   Live Births  0           Family History  Problem Relation Age of Onset  . Asthma Mother   . Heart failure Mother        cardiomyopathy  . Thyroid disease Mother   . Heart disease Mother   . Obesity Mother   . Hypertension Mother   . Heart murmur Sister   . Heart murmur Brother   . Diabetes Sister   . Thyroid disease Sister   . Heart murmur Sister     Social History   Tobacco Use  . Smoking status: Never Smoker  . Smokeless tobacco: Never Used  Vaping Use  . Vaping Use: Never used  Substance Use Topics  . Alcohol use: Not Currently    Alcohol/week: 0.0 standard drinks    Comment: Rarely.  . Drug use: No    Home Medications Prior to Admission medications   Medication Sig Start Date End Date Taking? Authorizing Provider  acetaminophen (TYLENOL) 325 MG tablet Take 325 mg by mouth every 6 (six) hours as needed (FOR PAIN).   Yes [provider]  albuterol (PROVENTIL) (2.5 MG/3ML) 0.083% nebulizer solution Take 3 mLs (2.5 mg total) by  nebulization every 6 (six) hours as needed for wheezing or shortness of breath. 04/24/20  Yes Parrett, Tammy S, NP  Ascorbic Acid (VITAMIN C) 1000 MG tablet Take 1,000 mg by mouth every other day.    Yes [provider]  b complex vitamins tablet Take 1 tablet by mouth daily.   Yes [provider]  BIKTARVY 50-200-25 MG TABS tablet TAKE 1 TABLET BY MOUTH DAILY 04/24/20  Yes [name], MD  BIOTIN PO Take 1 tablet by mouth daily as  needed (for supplementation).    Yes [provider]  calcium carbonate (OSCAL) 1500 (600 Ca) MG TABS tablet Take 600 mg of elemental calcium by mouth daily with breakfast.   Yes [provider]  Cholecalciferol (SM VITAMIN D3) 100 MCG (4000 UT) CAPS Take 1 capsule (4,000 Units total) by mouth daily. 04/18/19  Yes Abby Potash, PA-C  cloNIDine (CATAPRES) 0.1 MG tablet Take 1 tablet (0.1 mg total) by mouth every 12 (twelve) hours. 04/02/20  Yes Skeet Latch, MD  cyanocobalamin (,VITAMIN B-12,) 1000 MCG/ML injection ADMINISTER 1 ML(1000 MCG) IN THE MUSCLE EVERY 30 DAYS Patient taking differently: Inject 1,000 mcg into the muscle every 30 (thirty) days. 07/23/20  Yes [name], MD  diltiazem (CARDIZEM CD) 240 MG 24 hr capsule Take 1 capsule (240 mg total) by mouth daily. 07/05/20  Yes [name], MD  fluticasone furoate-vilanterol (BREO ELLIPTA) 200-25 MCG/INH AEPB Inhale 1 puff into the lungs daily. 02/05/20  Yes Rigoberto Noel, MD  furosemide (LASIX) 20 MG tablet Take 1 tablet (20 mg total) by mouth daily. As needed Patient taking differently: Take 20 mg by mouth daily as needed for fluid. 07/05/20  Yes [name], MD  MAGNESIUM PO Take 1 tablet by mouth every other day.    Yes [provider]  Melatonin 5 MG TABS Take 5 mg by mouth at bedtime as needed (for sleep).   Yes [provider]  Mouthwashes (BIOTENE DRY MOUTH GENTLE) LIQD Use as directed 1 Dose in the mouth or throat 2 (two) times daily as  needed (dry mouth). 02/13/20  Yes [name], MD  Omega-3 Fatty Acids (FISH OIL) 1000 MG CAPS Take 1,000 mg by mouth daily as needed (pt prefrence).   Yes [provider]  omeprazole (PRILOSEC) 40 MG capsule Take 1 capsule (40 mg total) by mouth daily as needed (heart burn). 02/13/20  Yes [name], MD  polyethylene glycol (MIRALAX / GLYCOLAX) 17 g packet Take 17 g by mouth daily as needed for mild constipation. 06/28/20  Yes [name], MD  PREVIDENT 5000 SENSITIVE 1.1-5 % PSTE Use daily as directed 01/24/18  Yes [provider]  senna (SENOKOT) 8.6 MG tablet Take 1 tablet by mouth daily as needed for constipation.   Yes [provider]  spironolactone (ALDACTONE) 25 MG tablet TAKE 1 TABLET(25 MG) BY MOUTH DAILY Patient taking differently: Take 25 mg by mouth daily. 07/23/20  Yes [name], MD  vitamin A 3 MG (10000 UNITS) capsule Take 10,000 Units by mouth daily.   Yes [provider]  XARELTO 20 MG TABS tablet TAKE 1 TABLET(20 MG) BY MOUTH DAILY WITH SUPPER Patient taking differently: Take 20 mg by mouth daily. 07/05/20  Yes [name], MD  zinc gluconate 50 MG tablet Take 50 mg by mouth daily.   Yes [provider]  doxycycline (VIBRAMYCIN) 100 MG capsule Take 1 capsule (100 mg total) by mouth 2 (two) times daily for 7 days. 08/27/20 09/03/20  Marcello Fennel, PA-C  furosemide (LASIX) 20 MG tablet Take 1 tablet (20 mg total) by mouth every other day. Patient not taking: Reported on 08/20/2020 02/17/20 08/15/20  Mercy Riding, MD  guaiFENesin (ROBITUSSIN) 100 MG/5ML liquid Take 5-10 mLs (100-200 mg total) by mouth every 4 (four) hours as needed for cough. 08/27/20   Marcello Fennel, PA-C  Misc. Devices (PULSE OXIMETER FOR FINGER) MISC 1 Units by Does not apply route as needed. 01/19/19  Skeet Latch, MD  OXYGEN Inhale 1.5-2 L into the lungs See admin instructions. Uses at bedtime and through the day as needed    [provider]  predniSONE (DELTASONE) 20 MG tablet Take 1 tablet (20 mg total) by mouth daily for 5 days. 08/27/20 09/01/20  Marcello Fennel, PA-C  PROAIR HFA 108 [phone] Base) MCG/ACT inhaler Inhale 2 puffs into the lungs every 4 (four) hours as needed for wheezing or shortness of breath. 08/29/20   [name], MD  SYRINGE/NEEDLE, DISP, 1 ML (B-D SYRINGE/NEEDLE 1CC/25GX5/8) 25G X 5/8" 1 ML MISC 1 Units by Does not apply route every 30 (thirty) days. 04/14/19   [name], MD    Allergies    Tree extract, Augmentin [amoxicillin-pot clavulanate], Lisinopril, and Ciprofloxacin  Review of Systems   Review of Systems  All other systems reviewed and are negative.   Physical Exam Updated Vital Signs BP (!) 152/103   Pulse 73   Temp 98.2 F (36.8 C) (Oral)   Resp 18   Ht 5' 3.5" (1.613 m)   Wt 104.8 kg   SpO2 100%   BMI 40.28 kg/m   Physical Exam Vitals and nursing note reviewed.  Constitutional:      General: She is not in acute distress.    Appearance: She is well-developed and well-nourished. She is not diaphoretic.  HENT:     Head: Normocephalic and atraumatic.  Cardiovascular:     Rate and Rhythm: Normal rate and regular rhythm.     Heart sounds: No murmur heard. No friction rub. No gallop.   Pulmonary:     Effort: Pulmonary effort is normal. No respiratory distress.     Breath sounds: Normal breath sounds. No wheezing.  Abdominal:     General: Bowel sounds are normal. There is no distension.     Palpations: Abdomen is soft.     Tenderness: There is no abdominal tenderness. There is left CVA tenderness. There is no guarding or rebound.  Musculoskeletal:        General: Normal range of motion.     Cervical back: Normal range of motion and neck supple.  Skin:    General: Skin is warm and dry.  Neurological:     Mental Status: She is alert and oriented to person, place, and time.     ED Results / Procedures / Treatments   Labs (all labs ordered are listed, but  only abnormal results are displayed) Labs Reviewed  BASIC METABOLIC PANEL - Abnormal; Notable for the following components:      Result Value   CO2 33 (*)    Creatinine, Ser 1.30 (*)    GFR, Estimated 45 (*)    All other components within normal limits  RESP PANEL BY RT-PCR (FLU A&B, COVID) ARPGX2  CBC WITH DIFFERENTIAL/PLATELET    EKG None  Radiology No results found.  Procedures Procedures (including critical care time)  Medications Ordered in ED Medications  morphine 4 MG/ML injection 4 mg (4 mg Intravenous Given 08/20/20 2232)  ondansetron (ZOFRAN) injection 4 mg (4 mg Intravenous Given 08/20/20 2229)  albuterol (VENTOLIN HFA) 108 (90 Base) MCG/ACT inhaler 4 puff (4 puffs Inhalation Given 08/20/20 2149)  sodium chloride 0.9 % bolus 500 mL (0 mLs Intravenous Stopped 08/20/20 2318)  morphine 4 MG/ML injection 4 mg (4 mg Intravenous Given 08/21/20 0138)    ED Course  I have reviewed the triage vital signs and the nursing notes.  Pertinent labs & imaging results  that were available during my care of the patient were reviewed by me and considered in my medical decision making (see chart for details).    MDM Rules/Calculators/A&P  Patient to undergo CT scan to rule out an emergent cause of her left flank pain.  Care will be signed out to oncoming provider at shift change.  Dr. Alvino Chapel will obtain the results of the study and determine the final disposition.  Final Clinical Impression(s) / ED Diagnoses Final diagnoses:  Acute back pain, unspecified back location, unspecified back pain laterality    Rx / DC Orders ED Discharge Orders    None       Veryl Speak, MD 09/01/20 [phone]

## 2020-09-02 ENCOUNTER — Other Ambulatory Visit: Payer: Self-pay

## 2020-09-02 ENCOUNTER — Ambulatory Visit (INDEPENDENT_AMBULATORY_CARE_PROVIDER_SITE_OTHER): Payer: Medicare Other | Admitting: Internal Medicine

## 2020-09-02 ENCOUNTER — Telehealth (INDEPENDENT_AMBULATORY_CARE_PROVIDER_SITE_OTHER): Payer: Medicare Other | Admitting: Internal Medicine

## 2020-09-02 DIAGNOSIS — R0602 Shortness of breath: Secondary | ICD-10-CM | POA: Diagnosis not present

## 2020-09-02 DIAGNOSIS — T66XXXA Radiation sickness, unspecified, initial encounter: Secondary | ICD-10-CM

## 2020-09-02 DIAGNOSIS — J181 Lobar pneumonia, unspecified organism: Secondary | ICD-10-CM | POA: Diagnosis not present

## 2020-09-02 DIAGNOSIS — B20 Human immunodeficiency virus [HIV] disease: Secondary | ICD-10-CM | POA: Diagnosis not present

## 2020-09-02 DIAGNOSIS — J4489 Other specified chronic obstructive pulmonary disease: Secondary | ICD-10-CM

## 2020-09-02 DIAGNOSIS — J449 Chronic obstructive pulmonary disease, unspecified: Secondary | ICD-10-CM

## 2020-09-02 MED ORDER — PREDNISONE 20 MG PO TABS: 40.0000 mg | ORAL_TABLET | Freq: Every day | ORAL | 0 refills | Status: DC

## 2020-09-02 MED ORDER — PROAIR HFA 108 (90 BASE) MCG/ACT IN AERS
2.0000 | INHALATION_SPRAY | RESPIRATORY_TRACT | 3 refills | Status: DC | PRN
Start: 2020-09-02 — End: 2020-11-15

## 2020-09-02 NOTE — Progress Notes (Signed)
CC: cough/back pain  This is a telephone encounter between Shannon [name] and Shannon [name] on 09/02/2020 for cough/back pain. The visit was conducted with the patient located at home and Shannon [name] at [facility]. The patient's identity was confirmed using their DOB and current address. The patient has consented to being evaluated through a telephone encounter and understands the associated risks (an examination cannot be done and the patient may need to come in for an appointment) / benefits (allows the patient to remain at home, decreasing exposure to coronavirus). I personally spent 30 minutes on medical discussion.   HPI:  [name] is a 65 y.o. with PMH as below.   Please see A&P for assessment of the patient's acute and chronic medical conditions.   Diagnosed stage I lung cancer and underwent radiation, completed 04/2020. In December she began to have back/flank pain. CXR was done due to history of lung cancer and noted enlarging left perihilar mass, recommended follow-up CT. She unfortunately missed her follow-up appointment with her oncologist and went the ER on 12/29. CT there showed consolidation of the LUL thought to likely reflect changes from radiation in addition to diffuse bronchial wall thickening similar to prior CT 12/2019.  She again went to the ER on 1/4 due to non-productive cough, SOB and left shoulder/back pain and chills. Covid negative. CXR showed possible new infiltrate on the right.  She was prescribed doxycycline and prednisone but was unable to pick up these medications until the 8th.  She does not feel any different and continues to have non-productive cough, SOB with exertion, weakness, decreased appetite. She denies chest pain, LE swelling. She checked her vitals at home and does not have any fever, blood pressure is in 120s, HR 85, and oxygen saturation is 93-95%. She uses supplemental oxygen at night for nocturnal hypoxia.  She also discussed case with Dr.  Baxter Flattery and will follow-up with her once she is feeling better for repeat HIV quant.    Past Medical History:  Diagnosis Date  . Anemia   . Anxiety    HX PANIC ATTACKS  . Arthritis    "starting to; in my hands" (07/09/2015)  . Asthma   . Atrial fibrillation ([place])   . Atrial flutter, paroxysmal ([place])   . Bloated abdomen   . CFS (chronic fatigue syndrome)   . Chewing difficulty   . Chronic asthma with acute exacerbation    "I have chronic asthma all the time; sometimes exacerbations" (07/09/2015)  . Chronic lower back pain   . COPD (chronic obstructive pulmonary disease) ([place])   . Cyst of right kidney    "3 of them; dx'd in ~ 01/2015"  . Dyspnea   . GERD (gastroesophageal reflux disease)   . Heart murmur   . History of blood transfusion    "related to my brain surgery I think"  . History of pulmonary embolism 07/09/2015  . HIV antibody positive ([place])   . HIV disease ([place])   . Hyperlipidemia   . Hypertension   . Leg edema   . Lipodystrophy   . Mild CAD 2013  . Multiple thyroid nodules   . Osteopenia   . Palpitations   . Pneumonia 07/09/2015  . Shingles   . Sleep apnea    "never completed part 2 of study; never wore mask" (07/09/2015)  . Vitamin B 12 deficiency   . Vitamin D deficiency    Review of Systems:   Review of Systems  Constitutional: Positive  for chills and malaise/fatigue. Negative for diaphoresis and fever.  Respiratory: Positive for cough and shortness of breath. Negative for hemoptysis, sputum production and wheezing.   Cardiovascular: Negative for chest pain, palpitations and leg swelling.  Gastrointestinal: Positive for nausea. Negative for constipation, diarrhea and vomiting.  Musculoskeletal: Positive for back pain.  Neurological: Positive for headaches. Negative for dizziness.    Assessment & Plan:   See Encounters Tab for problem based charting.  Patient discussed with Dr. Daryll Drown

## 2020-09-02 NOTE — Assessment & Plan Note (Signed)
Diagnosed stage I lung cancer and underwent radiation, completed 04/2020. In December she began to have back/flank pain. CXR was done due to history of lung cancer and noted enlarging left perihilar mass, recommended follow-up CT. She unfortunately missed her follow-up appointment with her oncologist and went the ER on 12/29. CT there showed consolidation of the LUL thought to likely reflect changes from radiation in addition to diffuse bronchial wall thickening similar to prior CT 12/2019.  She again went to the ER on 1/4 due to non-productive cough, SOB and left shoulder/back pain and chills. Covid negative. CXR showed possible new infiltrate on the right.  She was prescribed doxycycline and prednisone but was unable to pick up these medications until the 8th.  She does not feel any different and continues to have non-productive cough, SOB with exertion, weakness, decreased appetite. She denies chest pain, pleuritic pain, LE swelling. She checked her vitals at home and does not have any fever, blood pressure is in 120s, HR 85, and oxygen saturation is 93-95%. She uses supplemental oxygen at night for nocturnal hypoxia.  She also discussed case with [name] and will follow-up with her once she is feeling better for repeat HIV quant.  Differential includes radiation fibrosis vs. Pneumonia although afebrile, no leukocytosis during ER visit and findings may be related to her atelectasis. Doubt PE as she is on xarelto, no pleuritic pain, and HR 85 at home. ECG 1/4 showed NSR with frequent PACs. Labs were otherwise stable.   - she has been afebrile, cough is non-productive and her symptoms are located more on the left. CXR on 1/4 did show a new infiltrate vs. Worsening atelectasis on the right, will have her complete doxycycline antibiotics  - CT 12/29 concerning for changes from radiation. Will place order for high resolution CT and continue prednisone, increase to 40 mg for 10 days total. She has follow-up  with Dr. Sondra Come with rad/onc on 1/17.  - refilled albuterol - recommended she follow-up in person in clinic or to ER if symptoms worsen - f/u with pulmonology, f/u with ID once symptoms improve.

## 2020-09-02 NOTE — Progress Notes (Signed)
Virtual Visit via Telephone Note  I connected with Shannon [name] on 09/02/20 at 11:30 AM EST by telephone and verified that I am speaking with the correct person using two identifiers.  Location: Patient: home Provider: clinic   I discussed the limitations, risks, security and privacy concerns of performing an evaluation and management service by telephone and the availability of in person appointments. I also discussed with the patient that there may be a patient responsible charge related to this service. The patient expressed understanding and agreed to proceed.   History of Present Illness:  Jan 3rd diagnosed with pneumonia. Went to ED for greater than 9 hrs. Then had to go back to the ED for 7 hrs which was "war zone", then was seen by PCP, IM office who took her from waiting area. She is feeling slightly better as she was started on abtx by her pcp office. Still is distraught about the prolonged visit in the ED and possible exposure to covid.    Patient Active Problem List   Diagnosis Date Noted  . Shoulder blade pain 08/09/2020  . Skin ulcer ([place]) 06/24/2020  . Bronchitis 03/07/2020  . Costochondritis, acute 03/07/2020  . Pulmonary nodule 02/14/2020  . Nodule of upper lobe of left lung 02/06/2020  . Thyroid nodule 01/26/2020  . Adenocarcinoma of lung ([place]) 01/04/2020  . Chronic obstructive asthma ([place]) 12/29/2019  . Myalgia 10/24/2019  . (HFpEF) heart failure with preserved ejection fraction ([place]) 02/07/2019  . Leg swelling 11/03/2018  . Other fatigue 06/30/2018  . Shortness of breath on exertion 06/30/2018  . Hyperglycemia 06/30/2018  . Vitamin D deficiency 06/30/2018  . Chronic anticoagulation 01/03/2018  . Chronic respiratory failure with hypoxia ([place]) 11/30/2017  . Constipation 11/03/2017  . Atrial flutter ([place]) 03/11/2017  . CAD (coronary artery disease) 01/28/2017  . Obesity (BMI 30-39.9) 01/13/2017  . Ascending aorta dilatation (HCC) 01/07/2017  . Low back pain  radiating to right lower extremity 12/02/2016  . Acquired cyst of kidney 12/02/2016  . Chronic kidney disease (CKD), stage III (moderate) ([place]) 12/01/2016  . Generalized anxiety disorder 10/22/2016  . Hypersomnia 10/01/2016  . Accessory skin tags 06/10/2016  . Nocturnal hypoxemia 05/13/2016  . Cervical radiculopathy 04/14/2016  . GERD (gastroesophageal reflux disease) 01/02/2016  . Vitamin B12 deficiency 10/02/2015  . Moderate persistent asthma with exacerbation 07/09/2015  . HIV disease ([place]) 07/09/2015  . Uncontrolled hypertension 07/09/2015    Physical Exam : telephone visit, no exam  Lab Results  Component Value Date   CD4TABS 445 05/24/2020   CD4TABS 669 11/10/2019   CD4TABS 530 09/07/2019   Lab Results  Component Value Date   HIV1RNAQUANT <20 05/24/2020   CBC Lab Results  Component Value Date   WBC 6.7 08/27/2020   RBC 3.88 08/27/2020   HGB 12.3 08/27/2020   HCT 37.7 08/27/2020   PLT 190 08/27/2020   MCV 97.2 08/27/2020   MCH 31.7 08/27/2020   MCHC 32.6 08/27/2020   RDW 12.9 08/27/2020   LYMPHSABS 1.3 08/27/2020   MONOABS 0.6 08/27/2020   EOSABS 0.1 08/27/2020    BMET Lab Results  Component Value Date   NA 135 08/27/2020   K 4.8 08/27/2020   CL 97 (L) 08/27/2020   CO2 30 08/27/2020   GLUCOSE 118 (H) 08/27/2020   BUN 18 08/27/2020   CREATININE 1.53 (H) 08/27/2020   CALCIUM 10.2 08/27/2020   GFRNONAA 37 (L) 08/27/2020   GFRAA 47 (L) 05/24/2020      Assessment and Plan/Follow Up  Instructions:   - will recommend to get cxr per her pcp guidance; having mild improvement in symptoms (but patient doesn't agree)  hiv disease = continue on bitkarvy  I discussed the assessment and treatment plan with the patient. The patient was provided an opportunity to ask questions and all were answered. The patient agreed with the plan and demonstrated an understanding of the instructions.   The patient was advised to call back or seek an in-person evaluation if  the symptoms worsen or if the condition fails to improve as anticipated.  I provided 10 minutes of non-face-to-face time during this encounter.

## 2020-09-04 ENCOUNTER — Other Ambulatory Visit: Payer: Self-pay

## 2020-09-04 ENCOUNTER — Ambulatory Visit: Payer: Medicare Other | Admitting: Behavioral Health

## 2020-09-04 DIAGNOSIS — F411 Generalized anxiety disorder: Secondary | ICD-10-CM

## 2020-09-04 NOTE — BH Specialist Note (Signed)
Integrated Behavioral Health via Telemedicine Visit  09/04/2020 Shannon [name] [id number]  Number of [place] visits: Initial Session Start time: 1:00pm  Session End time: 1:45pm Total time: 45   Referring Provider: [facility] Resident Patient/Family location: Pt is in her home alone [facility] Provider location: [facility] [place] Office All persons participating in visit: Pt & Clinician Types of Service: Intake/Assessment process  I connected with [name] and/or Ocie Bob Mcmains's self by Telephone  (Video is Caregility application) and verified that I am speaking with the correct person using two identifiers.Discussed confidentiality: Yes   I discussed the limitations of telemedicine and the availability of in person appointments.  Discussed there is a possibility of technology failure and discussed alternative modes of communication if that failure occurs.  I discussed that engaging in this telemedicine visit, they consent to the provision of behavioral healthcare and the services will be billed under their insurance.  Patient and/or legal guardian expressed understanding and consented to Telemedicine visit: Yes   Presenting Concerns: Patient and/or family reports the following symptoms/concerns: elevated anxiety & depression due to recent health status changes & damage to her [place] that is not being repaired by [employer] or [employer]. Pt has contacted all relevant Administrative persons & is finally seeing some movement today. Duration of problem: Several months of greater intensity; Severity of problem: severe, & Pt has a very resilient, hearty personality  Patient and/or Family's Strengths/Protective Factors: Social and Emotional competence and patient resilient  Goals Addressed: Patient will: 1.  Reduce symptoms of: agitation, anxiety, depression and stress  2.  Increase knowledge and/or ability of: coping skills and stress reduction  3.  Demonstrate  ability to: Increase healthy adjustment to current life circumstances and Increase adequate support systems for patient/family  Progress towards Goals: Estb'd today: movement & solutions over current housing situation  Interventions: Interventions utilized:  Supportive Counseling and Supportive Reflection Standardized Assessments completed: Not Needed  Patient and/or Family Response: Pt receptive to telehealth visit today as she is sick w/pneumonia.  Assessment: Patient currently experiencing housing concerns that are overwhelming. There has been damage to her bathroom & living room ceiling due to a backed up toilet. The ceiling has cont'd to worsen & Pt's attempts to secure help for repair have been unsuccessful thus far. An Inspector is supposed to arrive today @ 2:00pm to provide services to initiate steps towards repair. If this does not occur this week, Pt will contact an Atty.  Patient may benefit from supportive listening & resource gathering. Pt is battling multiple health issues & needs the benefit of housing security that is safe for her health.   Plan: 1. Follow up with behavioral health clinician on : in one week via telehealth for update 2. Behavioral recommendations: Keep good records of what [employer] does to remedy situation-keep your cool, but be firm & assertive like you always are to handle problems. Rely on your Healthcare Team you trust. 3. Referral(s): [place] (In Clinic)  I discussed the assessment and treatment plan with the patient and/or parent/guardian. They were provided an opportunity to ask questions and all were answered. They agreed with the plan and demonstrated an understanding of the instructions.   They were advised to call back or seek an in-person evaluation if the symptoms worsen or if the condition fails to improve as anticipated.  Donnetta Hutching, LMFT

## 2020-09-05 ENCOUNTER — Encounter: Payer: Self-pay | Admitting: Cardiovascular Disease

## 2020-09-05 ENCOUNTER — Encounter: Payer: Medicare Other | Admitting: Cardiovascular Disease

## 2020-09-05 ENCOUNTER — Telehealth: Payer: Self-pay | Admitting: Behavioral Health

## 2020-09-05 NOTE — Telephone Encounter (Signed)
Brief call to Pt to check-in about situation described in [facility] session. Pt is upbeat & cautiously waiting on the results of several pivotal ppl whom she spoke w/Wed to initiate her repairs & f/u on 30d wait time for authorization. Pt more confident today.  Dr. Kerin Salen, PhD, LMFT

## 2020-09-05 NOTE — Progress Notes (Signed)
This encounter was created in error - please disregard.

## 2020-09-09 ENCOUNTER — Ambulatory Visit
Admission: RE | Admit: 2020-09-09 | Discharge: 2020-09-09 | Disposition: A | Discharge status: Home / Self Care | Payer: Medicare Other | Source: Ambulatory Visit | Attending: Radiation Oncology | Admitting: Radiation Oncology

## 2020-09-09 NOTE — Progress Notes (Incomplete)
Radiation Oncology         [phone] ________________________________  Name: Shannon [name] MRN: [id number]  Date: 09/09/2020  DOB: [birth year]  Follow-Up Visit Note  CC: [name], MD  [name], MD  No diagnosis found.  Diagnosis: Stage IA (T1c, N0, M0) non-small cell lung cancer of the left upper lobe, adenocarcinoma  Interval Since Last Radiation: Four months, two weeks, and four days  Radiation Treatment Dates: 04/09/2020 through 04/22/2020 Site Technique Total Dose (Gy) Dose per Fx (Gy) Completed Fx Beam Energies  Lung, Left: Lung_Lt SBRT 60/60 12 5/5 6XFFF    Narrative:  The patient returns today for routine follow-up. Since her last visit, she underwent a chest x-ray on 08/08/2020 that showed an enlarging left perihilar mass. CT scan of chest on 08/21/2020 showed a developing consolidation within the left upper lobe that almost certainly represented changes related to radiation therapy. The previously noted pulmonary mass was difficult to discretely measure separate from the area of consolidation, but appeared reduced in size in keeping with interval response to therapy. There was no evidence of regional normal or distant metastatic disease within the thorax. Chronic airway inflammation changes and dilation of the thoracic aorta were stable.  Of note, she was seen in the ED on 08/27/2020 for evaluation of URI symptoms. Chest x-ray at that time showed patchy right basilar opacities, atelectasis vs infiltrate. It also showed post-treatment appearance of the left lung. She was diagnosed with community acquired pneumonia of the right lower lobe and was discharged with prescriptions for Doxycyline, Prednisone, and Robitussin.  On review of systems, she reports ***. She denies ***.         ALLERGIES:  is allergic to tree extract, augmentin [amoxicillin-pot clavulanate], lisinopril, and ciprofloxacin.  Meds: Current Outpatient Medications  Medication Sig Dispense Refill   . acetaminophen (TYLENOL) 325 MG tablet Take 325 mg by mouth every 6 (six) hours as needed (FOR PAIN).    [name] albuterol (PROVENTIL) (2.5 MG/3ML) 0.083% nebulizer solution Take 3 mLs (2.5 mg total) by nebulization every 6 (six) hours as needed for wheezing or shortness of breath. 75 mL 3  . Ascorbic Acid (VITAMIN C) 1000 MG tablet Take 1,000 mg by mouth every other day.     . b complex vitamins tablet Take 1 tablet by mouth daily.    [name] BIKTARVY 50-200-25 MG TABS tablet TAKE 1 TABLET BY MOUTH DAILY 30 tablet 5  . BIOTIN PO Take 1 tablet by mouth daily as needed (for supplementation).     . calcium carbonate (OSCAL) 1500 (600 Ca) MG TABS tablet Take 600 mg of elemental calcium by mouth daily with breakfast.    . Cholecalciferol (SM VITAMIN D3) 100 MCG (4000 UT) CAPS Take 1 capsule (4,000 Units total) by mouth daily. 30 capsule 0  . cloNIDine (CATAPRES) 0.1 MG tablet Take 1 tablet (0.1 mg total) by mouth every 12 (twelve) hours. 180 tablet 1  . cyanocobalamin (,VITAMIN B-12,) 1000 MCG/ML injection ADMINISTER 1 ML(1000 MCG) IN THE MUSCLE EVERY 30 DAYS (Patient taking differently: Inject 1,000 mcg into the muscle every 30 (thirty) days.) 3 mL 1  . diltiazem (CARDIZEM CD) 240 MG 24 hr capsule Take 1 capsule (240 mg total) by mouth daily. 90 capsule 3  . fluticasone furoate-vilanterol (BREO ELLIPTA) 200-25 MCG/INH AEPB Inhale 1 puff into the lungs daily. 1 each 5  . furosemide (LASIX) 20 MG tablet Take 1 tablet (20 mg total) by mouth every other day. 30 tablet 2  .  MAGNESIUM PO Take 1 tablet by mouth every other day.     . Melatonin 5 MG TABS Take 5 mg by mouth at bedtime as needed (for sleep).    . Misc. Devices (PULSE OXIMETER FOR FINGER) MISC 1 Units by Does not apply route as needed. 1 each 0  . Mouthwashes (BIOTENE DRY MOUTH GENTLE) LIQD Use as directed 1 Dose in the mouth or throat 2 (two) times daily as needed (dry mouth).    . Omega-3 Fatty Acids (FISH OIL) 1000 MG CAPS Take 1,000 mg by mouth daily  as needed (pt prefrence).    [name] omeprazole (PRILOSEC) 40 MG capsule Take 1 capsule (40 mg total) by mouth daily as needed (heart burn).    . OXYGEN Inhale 1.5-2 L into the lungs See admin instructions. Uses at bedtime and through the day as needed    . polyethylene glycol (MIRALAX / GLYCOLAX) 17 g packet Take 17 g by mouth daily as needed for mild constipation. 14 each   . predniSONE (DELTASONE) 20 MG tablet Take 2 tablets (40 mg total) by mouth daily with breakfast. 10 tablet 0  . PREVIDENT 5000 SENSITIVE 1.1-5 % PSTE Use daily as directed  1  . PROAIR HFA 108 (90 Base) MCG/ACT inhaler Inhale 2 puffs into the lungs every 4 (four) hours as needed for wheezing or shortness of breath. 18 g 3  . senna (SENOKOT) 8.6 MG tablet Take 1 tablet by mouth daily as needed for constipation.    [name] spironolactone (ALDACTONE) 25 MG tablet TAKE 1 TABLET(25 MG) BY MOUTH DAILY (Patient taking differently: Take 25 mg by mouth daily.) 90 tablet 1  . SYRINGE/NEEDLE, DISP, 1 ML (B-D SYRINGE/NEEDLE 1CC/25GX5/8) 25G X 5/8" 1 ML MISC 1 Units by Does not apply route every 30 (thirty) days. 50 each 0  . vitamin A 3 MG (10000 UNITS) capsule Take 10,000 Units by mouth daily.    Alveda Reasons 20 MG TABS tablet TAKE 1 TABLET(20 MG) BY MOUTH DAILY WITH SUPPER (Patient taking differently: Take 20 mg by mouth daily.) 90 tablet 3  . zinc gluconate 50 MG tablet Take 50 mg by mouth daily.     No current facility-administered medications for this encounter.    Physical Findings: The patient is in no acute distress. Patient is alert and oriented.  vitals were not taken for this visit.  No significant changes. Lungs are clear to auscultation bilaterally. Heart has regular rate and rhythm. No palpable cervical, supraclavicular, or axillary adenopathy. Abdomen soft, non-tender, normal bowel sounds. ***  Lab Findings: Lab Results  Component Value Date   WBC 6.7 08/27/2020   HGB 12.3 08/27/2020   HCT 37.7 08/27/2020   MCV 97.2 08/27/2020    PLT 190 08/27/2020    Radiographic Findings: DG Chest 2 View  Result Date: 08/27/2020 CLINICAL DATA:  short of breath EXAM: CHEST - 2 VIEW COMPARISON:  08/21/2020 and prior. FINDINGS: Decreased conspicuity of left lung consolidation, likely reflecting post treatment sequela. Patchy right basilar opacities are new since prior exam. No pneumothorax or pleural effusion. Stable cardiomediastinal silhouette. Multilevel spondylosis. IMPRESSION: Patchy right basilar opacities, atelectasis versus infiltrate. Post treatment appearance of the left lung. Electronically Signed   By: Primitivo Gauze M.D.   On: 08/27/2020 07:58   CT CHEST WO CONTRAST  Result Date: 08/21/2020 CLINICAL DATA:  Chest pain, back pain. Non-small cell lung cancer undergoing radiation therapy EXAM: CT CHEST WITHOUT CONTRAST TECHNIQUE: Multidetector CT imaging of the chest was performed following the  standard protocol without IV contrast. COMPARISON:  01/10/2020, PET-CT 01/25/2020 FINDINGS: Cardiovascular: Moderate multi-vessel coronary artery calcification. Mild global cardiomegaly, unchanged. No pericardial effusion. Central pulmonary arteries are enlarged in keeping with changes of pulmonary arterial hypertension. Stable dilation of the thoracic aorta, measuring 4.2 cm in diameter in its ascending segment, 3.4 cm in diameter within the arch just beyond the takeoff of the left subclavian artery, and 2.8 cm in diameter at the level of the left atrium. Mild superimposed atherosclerotic calcification within the thoracic aorta. Mediastinum/Nodes: No pathologic thoracic adenopathy. Esophagus unremarkable. Lungs/Pleura: There is developing consolidation within the left upper lobe likely reflecting changes of radiation therapy. Fiducial markers are noted within the left upper lobe. The previously noted pulmonary mass is difficult to discretely identify separate from the area of consolidation, particularly in absence of contrast administration.  No new focal pulmonary nodules or infiltrates. No pneumothorax or pleural effusion. There is diffuse bronchial wall thickening in keeping with changes of chronic airway inflammation. Minimal scarring at the right lung base is again noted. No central obstructing lesion. Upper Abdomen: Multiple hyperdense exophytic lesions arise from the upper pole of the visualized left kidney which appears stable since prior examinations of 11/22/2017 and likely represent multiple hyperdense renal cysts. Simple cortical cyst noted within the upper pole of the visualized right kidney. No acute abnormality within the visualized upper abdomen. Musculoskeletal: No lytic or blastic bone lesions are identified within the a thorax. IMPRESSION: No definite radiographic explanation for the patient's reported symptoms. Developing consolidation within the left upper lobe almost certainly representing changes related to radiation therapy. The previously noted pulmonary mass is now difficult to discretely measure separate from the area of consolidation but appears reduced in size in keeping with interval response to therapy. Discrete evaluation may be improved with contrast administration on subsequent examination. No evidence of regional normal or distant metastatic disease within the thorax. Stable changes of chronic airway inflammation. Stable dilation of the thoracic aorta. Recommend annual imaging followup by CTA or MRA. This recommendation follows 2010 ACCF/AHA/AATS/ACR/ASA/SCA/SCAI/SIR/STS/SVM Guidelines for the Diagnosis and Management of Patients with Thoracic Aortic Disease. Circulation. 2010; 121: K938-H829. Aortic aneurysm NOS (ICD10-I71.9) Aortic aneurysm NOS (ICD10-I71.9). Electronically Signed   By: Fidela Salisbury MD   On: 08/21/2020 01:46    Impression: Stage IA (T1c, N0, M0) non-small cell lung cancer of the left upper lobe, adenocarcinoma  Recent chest CT scan was relatively stable. ***  Plan: The patient will follow up  with radiation oncology in *** months.  Total time spent in this encounter was *** minutes which included reviewing the patient's most recent chest x-rays, chest CT scan, ED visit, physical examination, and documentation. ____________________________________   Blair Promise, PhD, MD  This document serves as a record of services personally performed by Gery Pray, MD. It was created on his behalf by Clerance Lav, a trained medical scribe. The creation of this record is based on the scribe's personal observations and the provider's statements to them. This document has been checked and approved by the attending provider.

## 2020-09-10 ENCOUNTER — Telehealth: Payer: Self-pay | Admitting: *Deleted

## 2020-09-10 NOTE — Progress Notes (Signed)
Internal Medicine Clinic Attending  Case discussed with Dr. Sharon Seller  At the time of the visit.  We reviewed the resident's history and pertinent patient test results.  I agree with the assessment, diagnosis, and plan of care documented in the resident's note.

## 2020-09-10 NOTE — Telephone Encounter (Signed)
CALLED PATIENT TO OFFER TO RESCHEDULE FU FOR 09-09-20, SPOKE WITH PATIENT AND RESCHEDULED FOR 09-30-20, PATIENT AGREED TO NEW DATE AND TIME

## 2020-09-11 ENCOUNTER — Other Ambulatory Visit: Payer: Self-pay

## 2020-09-11 ENCOUNTER — Encounter: Payer: Self-pay | Admitting: Cardiovascular Disease

## 2020-09-11 ENCOUNTER — Telehealth (INDEPENDENT_AMBULATORY_CARE_PROVIDER_SITE_OTHER): Payer: Medicare Other | Admitting: Cardiovascular Disease

## 2020-09-11 ENCOUNTER — Telehealth: Payer: Self-pay

## 2020-09-11 VITALS — BP 158/93 | HR 93 | Temp 96.5°F | Ht 64.0 in | Wt 233.0 lb

## 2020-09-11 DIAGNOSIS — M7989 Other specified soft tissue disorders: Secondary | ICD-10-CM

## 2020-09-11 DIAGNOSIS — I483 Typical atrial flutter: Secondary | ICD-10-CM | POA: Diagnosis not present

## 2020-09-11 DIAGNOSIS — I10 Essential (primary) hypertension: Secondary | ICD-10-CM

## 2020-09-11 DIAGNOSIS — I5032 Chronic diastolic (congestive) heart failure: Secondary | ICD-10-CM

## 2020-09-11 DIAGNOSIS — I77810 Thoracic aortic ectasia: Secondary | ICD-10-CM

## 2020-09-11 NOTE — Progress Notes (Signed)
Virtual Visit via Telephone Note   This visit type was conducted due to national recommendations for restrictions regarding the COVID-19 Pandemic (e.g. social distancing) in an effort to limit this patient's exposure and mitigate transmission in our community.  Due to her co-morbid illnesses, this patient is at least at moderate risk for complications without adequate follow up.  This format is felt to be most appropriate for this patient at this time.  The patient did not have access to video technology/had technical difficulties with video requiring transitioning to audio format only (telephone).  All issues noted in this document were discussed and addressed.  No physical exam could be performed with this format.  Please refer to the patient's chart for her  consent to telehealth for [facility].   The patient was identified using 2 identifiers.  Date:  09/11/2020   ID:  OUIDA ABEYTA, DOB 1956/07/29, MRN [id number]  Patient Location: Home Provider Location: Home Office  PCP:  [name], MD  Cardiologist:  Skeet Latch, MD  Electrophysiologist:  None   Evaluation Performed:  Follow-Up Visit  Chief Complaint:    History of Present Illness:    The patient does not have symptoms concerning for COVID-19 infection (fever, chills, cough, or new shortness of breath).   NIKOLINA SIMERSON is a 65 y.o. female with stage IA (T1c, N0, M0) non-small cell lung cancer starting XRT, hypertension, paroxysmal atrial flutter, asthma, medication non-compliance, non-obstructive CAD, OSA, prior PE, moderate pulmonary hypertension, mild ascending aorta aneurysm, PAD, and HIV (well-controlled) who presents via video conferencing for a telehealth visit today. Ms. Berenguer was initially referred by [name], MD, on 12/26/15. [name] was concerned about her lower extremity edema and shortness of breath. She was started on lasix 20 mg every other day. Ms. Crockett noted shortness of breath after a  hospitalization for pneumonia 06/2015. She denied lower extremity edema or orthopnea but did report chest pain when laying down at night. She was referred for an echo 01/2016 that revealed LVEF 60-65% awith grade 1 diastolic dysfunction and hypokinesis of the basal inferior wall. She also had very mild aortic stenosis with a mean gradient of 9 mmHg and trivial AR. PASP was 52 mmHg. She had a Lexiscan Myoviewat that time that was negative for ischemia. Ms. Mcgowen previously had a Lexiscan Cardiolite in September 2013 that revealed a medium, mild reversible defect in the anterior wall and normal systolic function. She subsequently underwent cardiac catheterization that revealed nonobstructive coronary disease. She had mild pulmonary hypertension and a normal cardiac output. Left ventriculography revealed mild to moderate aortic regurgitation. She had a 24-hour Holter in February 2015 that revealed rare PACs and PVCs. Echo 01/06/17 revealed LVEF 60-65% with mild LVH and a mean aortic valve gradient of 8 mmHg. Tricuspid regurgitation was not significant enough to assess pulmonary pressures.  Repeat echo 01/2019 revealed LVEF >65%, severe LVH, intracavitary gradient 61 mmHg, mild AS (mean gradient 13 mmHg), mild-moderate AR, and mild ascending aorta aneurysm (4.1 cm).  Ms. Bouldin was admitted to the hospital 01/25/17 with hypertensive emergency and chest pain. Her blood pressure was 215/100.  She consistently struggles with mediation adherence and has strong feelings about what medicines she should take and when she should take them.  [facility] lot of [facility] there but hasn't been going lately 2/2 COVID-19.  Lisinopril was stopped due to cough and throat swelling.  Ms. Fellows reported claudication  and had ABIs performed by her PCP, Dr. Daryll Drown. ABI/Dopplers 02/2019 revealed moderate R LE arterial disease and mild L LE disease.  She was referred to  our vascular specialists but prefers to keep seeing me.    Ms. Grafton's clonidine was increased to 0.3 mg weekly.  Prior to the vaccine her BP was running in the 130s-140s.  She decided to stop taking her clonidine for a few days before she got her vaccine because she did not know how it would affect her.  She had a repeat echocardiogram 10/2019 that revealed LVEF 60 to 65% with mild LVH and grade 1 diastolic dysfunction.  The ascending aorta was normal in size.  Ms. Bilski was diagnosed with Stage 1 non-small cell lung cancer.  She was not a surgical candidate.  She had XRT and experienced some hair loss.  She completed her 5 sessions.  Since her last appointment she was diagnosed with pneumonia January 3rd.  She was very short of breath and when she went for her follow up lung cancer CT they referred her to [facility] ED.  She had a CXR that showed R basliar opacities consistent with pneumonia. She was prescribed doxycline and prednisone but hasn't picked them up.  She saw [name] and her PCP.  Her Primary care office was able to see her same day because the ED was so busy.  She has been feeling better on her antibiotics.  She is gaining weight from eating on the prednisone.  Ms. Fecher has also been dealing with a toilet overflow in her apartment and foul smell.  It has been hard for her to breathe.  She called EMS and was brought to [facility] three days ago.  She left after 6.5 hours because it was too busy.  The waiting time to be seen was 15 hours. She has an outpatient chest CT pending. Her BP has been in the 120-130s mostly.  She hasn't taken her medication yet and is just waking up.  It was 132/77 yesterday.  She has no edema and her breathing is improving.  She has no orthopnea or PND.   Prior CV studies:    The following studies were reviewed today:  Echo 01/06/17: Study Conclusions  - Left ventricle: The cavity size was normal. Wall thickness was   increased in a pattern of mild LVH.  Systolic function was normal.   The estimated ejection fraction was in the range of 60% to 65%.   Wall motion was normal; there were no regional wall motion   abnormalities. Doppler parameters are consistent with abnormal   left ventricular relaxation (grade 1 diastolic dysfunction). - Aortic valve: Trileaflet; moderately calcified leaflets.   Sclerosis without stenosis. There was mild to moderate   regurgitation. Mean gradient (S): 8 mm Hg. - Mitral valve: There was no significant regurgitation. - Left atrium: The atrium was moderately dilated. - Right ventricle: The cavity size was normal. Systolic function   was normal. - Right atrium: The atrium was mildly dilated. - Pulmonary arteries: No complete TR doppler jet so unable to   estimate PA systolic pressure. - Systemic veins: IVC measured 2.2 cm with > 50% respirophasic   variation, suggesting RA pressure 8 mmHg.  Impressions: - Normal LV size with mild LV hypertrophy. EF 60-65%. Calcified,   trileaflet aortic valve. Sclerosis without significant stenosis,   mild to moderate aortic insufficiency. Normal RV size and   systolic function. Biatrial enlargement.  Echo 11/15/19: 1. Left ventricular  ejection fraction, by estimation, is 60 to 65%. The  left ventricle has normal function. The left ventricle has no regional  wall motion abnormalities. There is mild left ventricular hypertrophy.  Left ventricular diastolic parameters  are consistent with Grade I diastolic dysfunction (impaired relaxation).  2. Right ventricular systolic function is normal. The right ventricular  size is normal. There is normal pulmonary artery systolic pressure. The  estimated right ventricular systolic pressure is 31.4 mmHg.  3. The mitral valve is degenerative. No evidence of mitral valve  regurgitation. No evidence of mitral stenosis. There is turbulence across  the mitral valve but it does not appear restricted and there is no  evidence for a  significant pressure gradient  across it.  4. The aortic valve is tricuspid. Aortic valve regurgitation is trivial.  Mild aortic valve sclerosis is present, with no evidence of aortic valve  stenosis.  5. The inferior vena cava is normal in size with greater than 50%  respiratory variability, suggesting right atrial pressure of 3 mmHg.   Lower extremity Doppler 03/09/19: Tibial vessel disease; one vessel run-off via peroneal artery.    Summary:  Right: Near normal examination. No hemodynamically significant arterial  stenosis/occlusion noted in the CFA, DFA, SFA, popliteal artery and TPT.   Tibial vessel disease; zero vessel run-off.   Left: Near normal examination. No hemodynamically significant arterial  stenosis/occlusion noted in the CFA, DFA, SFA, popliteal artery and TPT.   Tibial vessel disease; one vessel run-off via peroneal artery.   Past Medical History:  Diagnosis Date  . Anemia   . Anxiety    HX PANIC ATTACKS  . Arthritis    "starting to; in my hands" (07/09/2015)  . Asthma   . Atrial fibrillation ([place])   . Atrial flutter, paroxysmal ([place])   . Bloated abdomen   . CFS (chronic fatigue syndrome)   . Chewing difficulty   . Chronic asthma with acute exacerbation    "I have chronic asthma all the time; sometimes exacerbations" (07/09/2015)  . Chronic lower back pain   . COPD (chronic obstructive pulmonary disease) ([place])   . Cyst of right kidney    "3 of them; dx'd in ~ 01/2015"  . Dyspnea   . GERD (gastroesophageal reflux disease)   . Heart murmur   . History of blood transfusion    "related to my brain surgery I think"  . History of pulmonary embolism 07/09/2015  . HIV antibody positive ([place])   . HIV disease ([place])   . Hyperlipidemia   . Hypertension   . Leg edema   . Lipodystrophy   . Mild CAD 2013  . Multiple thyroid nodules   . Osteopenia   . Palpitations   . Pneumonia 07/09/2015  . Shingles   . Sleep apnea    "never completed part 2 of study;  never wore mask" (07/09/2015)  . Vitamin B 12 deficiency   . Vitamin D deficiency    Past Surgical History:  Procedure Laterality Date  . ABDOMINAL HYSTERECTOMY     "robotic laparosopic"  . BRAIN SURGERY  1974   "brain tumor; benign; on top of my brain; got a plate in there"  . BRAIN SURGERY     age 41- -"Tumor pushing my skullout"  . BRONCHIAL BIOPSY  02/13/2020   Procedure: BRONCHIAL BIOPSIES;  Surgeon: [name], MD;  Location: [facility] ENDOSCOPY;  Service: Pulmonary;;  . BRONCHIAL BRUSHINGS  02/13/2020   Procedure: BRONCHIAL BRUSHINGS;  Surgeon: [name], MD;  Location: [facility] ENDOSCOPY;  Service: Pulmonary;;  . BRONCHIAL NEEDLE ASPIRATION BIOPSY  02/13/2020   Procedure: BRONCHIAL NEEDLE ASPIRATION BIOPSIES;  Surgeon: [name], MD;  Location: [place] ENDOSCOPY;  Service: Pulmonary;;  . CARDIAC CATHETERIZATION    . FIDUCIAL MARKER PLACEMENT  02/13/2020   Procedure: FIDUCIAL MARKER PLACEMENT;  Surgeon: [name], MD;  Location[facility] ENDOSCOPY;  Service: Pulmonary;;  . TONSILLECTOMY AND ADENOIDECTOMY    . VIDEO BRONCHOSCOPY WITH ENDOBRONCHIAL NAVIGATION N/A 02/13/2020   Procedure: VIDEO BRONCHOSCOPY WITH ENDOBRONCHIAL NAVIGATION;  Surgeon: [name], MD;  Location: [facility] ENDOSCOPY;  Service: Pulmonary;  Laterality: N/A;     Current Meds  Medication Sig  . acetaminophen (TYLENOL) 325 MG tablet Take 325 mg by mouth every 6 (six) hours as needed (FOR PAIN).  [name] albuterol (PROVENTIL) (2.5 MG/3ML) 0.083% nebulizer solution Take 3 mLs (2.5 mg total) by nebulization every 6 (six) hours as needed for wheezing or shortness of breath.  . Ascorbic Acid (VITAMIN C) 1000 MG tablet Take 1,000 mg by mouth every other day.   . b complex vitamins tablet Take 1 tablet by mouth daily.  [name] BIKTARVY 50-200-25 MG TABS tablet TAKE 1 TABLET BY MOUTH DAILY  . BIOTIN PO Take 1 tablet by mouth daily as needed (for supplementation).   . calcium carbonate (OSCAL) 1500 (600 Ca) MG TABS tablet Take 600 mg  of elemental calcium by mouth daily with breakfast.  . Cholecalciferol (SM VITAMIN D3) 100 MCG (4000 UT) CAPS Take 1 capsule (4,000 Units total) by mouth daily.  . cloNIDine (CATAPRES) 0.1 MG tablet Take 1 tablet (0.1 mg total) by mouth every 12 (twelve) hours.  . cyanocobalamin (,VITAMIN B-12,) 1000 MCG/ML injection ADMINISTER 1 ML(1000 MCG) IN THE MUSCLE EVERY 30 DAYS (Patient taking differently: Inject 1,000 mcg into the muscle every 30 (thirty) days.)  . diltiazem (CARDIZEM CD) 240 MG 24 hr capsule Take 1 capsule (240 mg total) by mouth daily.  [name] doxycycline (DORYX) 100 MG EC tablet Take 1 tablet (100 mg total) by mouth daily.  . fluticasone furoate-vilanterol (BREO ELLIPTA) 200-25 MCG/INH AEPB Inhale 1 puff into the lungs daily.  [name] MAGNESIUM PO Take 1 tablet by mouth every other day.   . Melatonin 5 MG TABS Take 5 mg by mouth at bedtime as needed (for sleep).  . Misc. Devices (PULSE OXIMETER FOR FINGER) MISC 1 Units by Does not apply route as needed.  . Mouthwashes (BIOTENE DRY MOUTH GENTLE) LIQD Use as directed 1 Dose in the mouth or throat 2 (two) times daily as needed (dry mouth).  . Omega-3 Fatty Acids (FISH OIL) 1000 MG CAPS Take 1,000 mg by mouth daily as needed (pt prefrence).  [name] omeprazole (PRILOSEC) 40 MG capsule Take 1 capsule (40 mg total) by mouth daily as needed (heart burn).  . OXYGEN Inhale 1.5-2 L into the lungs See admin instructions. Uses at bedtime and through the day as needed  . polyethylene glycol (MIRALAX / GLYCOLAX) 17 g packet Take 17 g by mouth daily as needed for mild constipation.  . predniSONE (DELTASONE) 20 MG tablet Take 20 mg daily  . PROAIR HFA 108 (90 Base) MCG/ACT inhaler Inhale 2 puffs into the lungs every 4 (four) hours as needed for wheezing or shortness of breath.  . senna (SENOKOT) 8.6 MG tablet Take 1 tablet by mouth daily as needed for constipation.  [name] spironolactone (ALDACTONE) 25 MG tablet TAKE 1 TABLET(25 MG) BY MOUTH DAILY  . SYRINGE/NEEDLE, DISP,  1 ML (B-D  SYRINGE/NEEDLE 1CC/25GX5/8) 25G X 5/8" 1 ML MISC 1 Units by Does not apply route every 30 (thirty) days.  . vitamin A 3 MG (10000 UNITS) capsule Take 10,000 Units by mouth daily.  Alveda Reasons 20 MG TABS tablet TAKE 1 TABLET(20 MG) BY MOUTH DAILY WITH SUPPER (Patient taking differently: Take 20 mg by mouth daily.)  . zinc gluconate 50 MG tablet Take 50 mg by mouth daily.     Allergies:   Tree extract, Augmentin [amoxicillin-pot clavulanate], Lisinopril, and Ciprofloxacin   Social History   Tobacco Use  . Smoking status: Never Smoker  . Smokeless tobacco: Never Used  Vaping Use  . Vaping Use: Never used  Substance Use Topics  . Alcohol use: Not Currently    Alcohol/week: 0.0 standard drinks    Comment: Rarely.  . Drug use: No     Family Hx: The patient's family history includes Asthma in her mother; Diabetes in her sister; Heart disease in her mother; Heart failure in her mother; Heart murmur in her brother, sister, and sister; Hypertension in her mother; Obesity in her mother; Thyroid disease in her mother and sister.   ROS:   Please see the history of present illness.     All other systems reviewed and are negative.   Labs/Other Tests and Data Reviewed:    Recent Labs: 02/15/2020: B Natriuretic Peptide 73.6 02/17/2020: Magnesium 2.0 08/27/2020: ALT 21; BUN 18; Creatinine, Ser 1.53; Hemoglobin 12.3; Platelets 190; Potassium 4.8; Sodium 135   Recent Lipid Panel Lab Results  Component Value Date/Time   CHOL 249 (H) 11/10/2019 10:55 AM   CHOL 253 (H) 05/03/2018 10:43 AM   TRIG 53 11/10/2019 10:55 AM   HDL 70 11/10/2019 10:55 AM   HDL 86 05/03/2018 10:43 AM   CHOLHDL 3.6 11/10/2019 10:55 AM   LDLCALC 164 (H) 11/10/2019 10:55 AM    Wt Readings from Last 3 Encounters:  09/11/20 233 lb (105.7 kg)  09/05/20 229 lb (103.9 kg)  08/27/20 231 lb (104.8 kg)     EKG:   07/03/2020: Sinus tachycardia.  Rate 103 bpm.  Left axis deviation.  Objective:    BP (!) 158/93    Pulse 93   Temp (!) 96.5 F (35.8 C)   Ht 5\' 4"  (1.626 m)   Wt 233 lb (105.7 kg)   SpO2 94%   BMI 39.99 kg/m  GENERAL: Sounds well.  No acute distress. RESP: Respirations unlabored NEURO:  Speech fluent.  PSYCH:  Cognitively intact, oriented to person place and time   ASSESSMENT & PLAN:    # Tooth extraction:  Three teeth removed successfully.  Getting restorative treatment soon.  # Hypertension: Blood pressure is high today but has generally been controlled.  She thinks it is high today because she hasn't taken her meds and prednisone.  Will continue to monitor. She doesn't want to increase her medication. Continue clonidine, diltiazem, spironolactone and lasix.   # Chronic diastolic heart failure: No edema.  Breathing improving with treatment of her pneumonia.  Continue BP management as above.   # Aortic regurgitation # Aortic stenosis: Repeat echo 10/2019 was negative for aortic stenosis and ascending aortic aneurysm. Both echos were reviewed.  It appears to be very mild, on the border between aortic stenosis and sclerosis.  # Hyperlipidemia: Lipids are elevated.  She isn't ready to start more medication.  Discussed limiting fried food, fatty food, cheese and red meat.  She will come for fasting lipids.  # Atrial flutter: Converted with flecainide.  Continue diltiazem and Xarelto.   # Ascending aorta aneurysm: 4.0 on CT 12/2016.It was unchanged on MRA 10/2018.  4.1 cm on echo 01/2019.  Stable on multiple CT scans.    # PAD: Lipid control as above.  She prefers to manage her other issues prior to seeing her vascular team.   Time:   Today, I have spent 38 minutes with the patient with telehealth technology discussing hypertension.  COVID-19 Education: The signs and symptoms of COVID-19 were discussed with the patient and how to seek care for testing (follow up with PCP or arrange E-visit).  The importance of social distancing was discussed today.   Medication  Adjustments/Labs and Tests Ordered: Current medicines are reviewed at length with the patient today.  Concerns regarding medicines are outlined above.   Tests Ordered: No orders of the defined types were placed in this encounter.  Medication Changes: No orders of the defined types were placed in this encounter.   Disposition:  Follow up in 3 month(s)  Signed, Skeet Latch, MD  09/11/2020 10:15 AM    [place]

## 2020-09-11 NOTE — Addendum Note (Signed)
Addended by: Kathyrn Lass on: 09/11/2020 10:31 AM   Modules accepted: Orders

## 2020-09-11 NOTE — Patient Instructions (Signed)
Medication Instructions:  Continue same medications *If you need a refill on your cardiac medications before your next appointment, please call your pharmacy*   Lab Work: Cmet,lipid panel 1 week before 11/06/20 appointment    Testing/Procedures: None ordered   Follow-Up: At [facility], you and your health needs are our priority.  As part of our continuing mission to provide you with exceptional heart care, we have created designated Provider Care Teams.  These Care Teams include your primary Cardiologist (physician) and Advanced Practice Providers (APPs -  Physician Assistants and Nurse Practitioners) who all work together to provide you with the care you need, when you need it.  We recommend signing up for the patient portal called "MyChart".  Sign up information is provided on this After Visit Summary.  MyChart is used to connect with patients for Virtual Visits (Telemedicine).  Patients are able to view lab/test results, encounter notes, upcoming appointments, etc.  Non-urgent messages can be sent to your provider as well.   To learn more about what you can do with MyChart, go to [email or web address].    Your next appointment:  Wed 11/06/20 at 10:40 am   The format for your next appointment: Office    Provider:  Dr.[place]

## 2020-09-11 NOTE — Telephone Encounter (Signed)
  Patient Consent for Virtual Visit         Shannon [name] has provided verbal consent on 09/11/2020 for a virtual visit (video or telephone).   CONSENT FOR VIRTUAL VISIT FOR:  Shannon [name]  By participating in this virtual visit I agree to the following:  I hereby voluntarily request, consent and authorize [place] and its employed or contracted physicians, physician assistants, nurse practitioners or other licensed health care professionals (the Practitioner), to provide me with telemedicine health care services (the "Services") as deemed necessary by the treating Practitioner. I acknowledge and consent to receive the Services by the Practitioner via telemedicine. I understand that the telemedicine visit will involve communicating with the Practitioner through live audiovisual communication technology and the disclosure of certain medical information by electronic transmission. I acknowledge that I have been given the opportunity to request an in-person assessment or other available alternative prior to the telemedicine visit and am voluntarily participating in the telemedicine visit.  I understand that I have the right to withhold or withdraw my consent to the use of telemedicine in the course of my care at any time, without affecting my right to future care or treatment, and that the Practitioner or I may terminate the telemedicine visit at any time. I understand that I have the right to inspect all information obtained and/or recorded in the course of the telemedicine visit and may receive copies of available information for a reasonable fee.  I understand that some of the potential risks of receiving the Services via telemedicine include:  [name] Delay or interruption in medical evaluation due to technological equipment failure or disruption; . Information transmitted may not be sufficient (e.g. poor resolution of images) to allow for appropriate medical decision making by the Practitioner;  and/or  . In rare instances, security protocols could fail, causing a breach of personal health information.  Furthermore, I acknowledge that it is my responsibility to provide information about my medical history, conditions and care that is complete and accurate to the best of my ability. I acknowledge that Practitioner's advice, recommendations, and/or decision may be based on factors not within their control, such as incomplete or inaccurate data provided by me or distortions of diagnostic images or specimens that may result from electronic transmissions. I understand that the practice of medicine is not an exact science and that Practitioner makes no warranties or guarantees regarding treatment outcomes. I acknowledge that a copy of this consent can be made available to me via my patient portal ([place]), or I can request a printed copy by calling the office of [place].    I understand that my insurance will be billed for this visit.   I have read or had this consent read to me. . I understand the contents of this consent, which adequately explains the benefits and risks of the Services being provided via telemedicine.  . I have been provided ample opportunity to ask questions regarding this consent and the Services and have had my questions answered to my satisfaction. . I give my informed consent for the services to be provided through the use of telemedicine in my medical care

## 2020-09-12 ENCOUNTER — Ambulatory Visit: Payer: Medicare Other | Admitting: Adult Health

## 2020-09-12 DIAGNOSIS — R269 Unspecified abnormalities of gait and mobility: Secondary | ICD-10-CM | POA: Diagnosis not present

## 2020-09-12 DIAGNOSIS — J449 Chronic obstructive pulmonary disease, unspecified: Secondary | ICD-10-CM | POA: Diagnosis not present

## 2020-09-12 DIAGNOSIS — J45909 Unspecified asthma, uncomplicated: Secondary | ICD-10-CM | POA: Diagnosis not present

## 2020-09-12 DIAGNOSIS — H5213 Myopia, bilateral: Secondary | ICD-10-CM | POA: Diagnosis not present

## 2020-09-12 DIAGNOSIS — G4733 Obstructive sleep apnea (adult) (pediatric): Secondary | ICD-10-CM | POA: Diagnosis not present

## 2020-09-16 DIAGNOSIS — N644 Mastodynia: Secondary | ICD-10-CM | POA: Diagnosis not present

## 2020-09-17 ENCOUNTER — Ambulatory Visit: Payer: Medicare Other

## 2020-09-17 ENCOUNTER — Other Ambulatory Visit: Payer: Self-pay

## 2020-09-17 ENCOUNTER — Ambulatory Visit (INDEPENDENT_AMBULATORY_CARE_PROVIDER_SITE_OTHER): Payer: Medicare Other | Admitting: Adult Health

## 2020-09-17 ENCOUNTER — Encounter: Payer: Self-pay | Admitting: Adult Health

## 2020-09-17 VITALS — BP 140/92 | HR 103 | Temp 98.3°F | Ht 64.0 in | Wt 230.0 lb

## 2020-09-17 DIAGNOSIS — J4541 Moderate persistent asthma with (acute) exacerbation: Secondary | ICD-10-CM | POA: Diagnosis not present

## 2020-09-17 DIAGNOSIS — M549 Dorsalgia, unspecified: Secondary | ICD-10-CM | POA: Insufficient documentation

## 2020-09-17 DIAGNOSIS — C3492 Malignant neoplasm of unspecified part of left bronchus or lung: Secondary | ICD-10-CM | POA: Diagnosis not present

## 2020-09-17 DIAGNOSIS — M546 Pain in thoracic spine: Secondary | ICD-10-CM

## 2020-09-17 DIAGNOSIS — J9611 Chronic respiratory failure with hypoxia: Secondary | ICD-10-CM | POA: Diagnosis not present

## 2020-09-17 MED ORDER — HYDROCODONE-ACETAMINOPHEN 5-325 MG PO TABS: 1.0000 | ORAL_TABLET | Freq: Two times a day (BID) | ORAL | 0 refills | Status: DC | PRN

## 2020-09-17 NOTE — Assessment & Plan Note (Signed)
No increased oxygen demands.  Patient is continue on oxygen with activity and at bedtime.

## 2020-09-17 NOTE — Progress Notes (Signed)
@Patient  ID: Shannon [name], female    DOB: Apr 14, 1956, 65 y.o.   MRN: [id number]  Chief Complaint  Patient presents with  . Asthma    Referring provider: [name], MD  HPI: 65 year old female never smoker followed for severe persistent chronic obstructive asthma, chronic hypoxic respiratory failure on oxygen 2 L with activity and at bedtime, allergic rhinitis.  Patient was diagnosed with lung cancer June 2021.(Stage Ia adenocarcinoma status post SBRT-5 sessions) Medical history significant for HIV disease followed by infectious disease.  Lipodystrophy, atrial flutter on chronic anticoagulation with Xarelto and stage III chronic kidney disease  TEST/EVENTS :  CT chest 01/10/2020 masslike consolidation in the left upper lobe measuring 2.7 x 1.9 x 1.9 cm  PET scan showed hypermetabolism in this lesion and also in a 9 mm left axillary lymph node and in the left thyroid nodule  10/2019 that revealed LVEF 60 to 65% with mild LVH and grade 1 diastolic dysfunction. The ascending aorta was normal in size.  Chest xray 05/2019 -cardiomegaly and vascular congestion without edema. No consolidative process, pneumothorax or pleural effusion. No acute or focal bony abnormality.  CTa chest 12/2016 , 01/2017 >neg for PE , multifocal scarring bilaterally  PFT (12/04/15) FEV1/FVC 50%, FEV1 0.64 31%.+++BD response. DLCO 66%.  Spirometry 09/2017-ratio 59, FEV124%, FVC 32%  Spirometry 05/2018 ratio 55, FEV1 28%,FVC 38%   Sleep study in 04/2016 was (-) for OSA  09/17/2020 Follow up : Asthma , Lung cancer , O2 RF  Patient returns for a 21-month follow-up.  Patient has underlying severe chronic obstructive asthma.  She has significant symptom burden with decreased activity tolerance.  Previously has tried multiple inhalers including Trelegy, Symbicort, Breo with no perceived benefit.  She is currently on albuterol nebulizer twice daily and as needed.  Overall says her breathing has been  not doing over last month .  Patient complains that she started having left-sided mid back pain in early December.  Pain continued to get worse with radiation over to the left ribs breast area and significant pain with inspiration.  She was seen in the emergency room on December 28.  She underwent a CT chest that showed developing consolidation in the left upper lobe felt to represent radiation changes.  With no evidence of distant metastatic disease noted in the thorax.  Patient was treated with pain medications in the emergency room.  Patient says her pain did improve after pain medicines were given but as soon as she got home symptoms continue to persist.  She went back to the emergency room with cold-like symptoms and persistent back and left rib pain.  COVID-19 testing was negative.  Influenza testing was negative.  Chest x-ray did show some patchy right basilar opacities versus atelectasis.  She was started on antibiotics and short course of prednisone.  Patient says she had improvement in cold-like symptoms but continues to have ongoing left mid back pain with radiation into the left ribs and breast area very severe at times pain on movement and inspiration.  Patient has been  set up for a high-resolution CT chest set up for later this week.   Patient has chronic respiratory failure on oxygen 2 L with activity and at bedtime.  Says oxygen levels have been doing well.  Using oxygen mainly only at nighttime.  Patient is a never smoker.  She was diagnosed with lung cancer in 2021 with stage Ia adenocarcinoma.  She is followed by radiation oncology and oncology.  She was  seen by thoracic surgery but felt not to be a good surgical candidate due to her multiple comorbidities and severe airflow fixed obstruction.  She did complete 5 sessions of SBRT.  CT chest December 2021 showed developing consolidation in the left upper lobe likely reflecting changes of radiation therapy.  Previously noted pulmonary mass  difficult to discretely identify from areas of consolidation.  Stable dilatation of the thoracic aorta.  Patient unfortunately missed her appointment with radiation oncology and oncology due to above sickness.  She has rescheduled with radiation oncology next month  Patient says she is been under a lot of stress lately and is very emotional today.  Has had a major water leak at her home and has been under the weather.  She says she has been taking ibuprofen 800 mg for pain with only minimum improvement in symptoms.  Says she has not been able to sleep due to pain.  Advised her of the dangers of taking nonsteroidals along with her Xarelto. She denies any known bleeding.   Allergies  Allergen Reactions  . Tree Extract Swelling and Other (See Comments)    Swelling to eyes  . Augmentin [Amoxicillin-Pot Clavulanate] Other (See Comments)    Headache, dizzy  . Lisinopril Cough    Face/throat swelling  . Ciprofloxacin Hives    Immunization History  Administered Date(s) Administered  . PFIZER(Purple Top)SARS-COV-2 Vaccination 12/01/2019, 12/27/2019    Past Medical History:  Diagnosis Date  . Anemia   . Anxiety    HX PANIC ATTACKS  . Arthritis    "starting to; in my hands" (07/09/2015)  . Asthma   . Atrial fibrillation ([place])   . Atrial flutter, paroxysmal ([place])   . Bloated abdomen   . CFS (chronic fatigue syndrome)   . Chewing difficulty   . Chronic asthma with acute exacerbation    "I have chronic asthma all the time; sometimes exacerbations" (07/09/2015)  . Chronic lower back pain   . COPD (chronic obstructive pulmonary disease) ([place])   . Cyst of right kidney    "3 of them; dx'd in ~ 01/2015"  . Dyspnea   . GERD (gastroesophageal reflux disease)   . Heart murmur   . History of blood transfusion    "related to my brain surgery I think"  . History of pulmonary embolism 07/09/2015  . HIV antibody positive ([place])   . HIV disease ([place])   . Hyperlipidemia   . Hypertension   . Leg  edema   . Lipodystrophy   . Mild CAD 2013  . Multiple thyroid nodules   . Osteopenia   . Palpitations   . Pneumonia 07/09/2015  . Shingles   . Sleep apnea    "never completed part 2 of study; never wore mask" (07/09/2015)  . Vitamin B 12 deficiency   . Vitamin D deficiency     Tobacco History: Social History   Tobacco Use  Smoking Status Never Smoker  Smokeless Tobacco Never Used   Counseling given: Not Answered   Outpatient Medications Prior to Visit  Medication Sig Dispense Refill  . acetaminophen (TYLENOL) 325 MG tablet Take 325 mg by mouth every 6 (six) hours as needed (FOR PAIN).    [name] albuterol (PROVENTIL) (2.5 MG/3ML) 0.083% nebulizer solution Take 3 mLs (2.5 mg total) by nebulization every 6 (six) hours as needed for wheezing or shortness of breath. 75 mL 3  . Ascorbic Acid (VITAMIN C) 1000 MG tablet Take 1,000 mg by mouth every other day.     [name]  b complex vitamins tablet Take 1 tablet by mouth daily.    [name] BIKTARVY 50-200-25 MG TABS tablet TAKE 1 TABLET BY MOUTH DAILY 30 tablet 5  . BIOTIN PO Take 1 tablet by mouth daily as needed (for supplementation).     . calcium carbonate (OSCAL) 1500 (600 Ca) MG TABS tablet Take 600 mg of elemental calcium by mouth daily with breakfast.    . Cholecalciferol (SM VITAMIN D3) 100 MCG (4000 UT) CAPS Take 1 capsule (4,000 Units total) by mouth daily. 30 capsule 0  . cloNIDine (CATAPRES) 0.1 MG tablet Take 1 tablet (0.1 mg total) by mouth every 12 (twelve) hours. 180 tablet 1  . cyanocobalamin (,VITAMIN B-12,) 1000 MCG/ML injection ADMINISTER 1 ML(1000 MCG) IN THE MUSCLE EVERY 30 DAYS (Patient taking differently: Inject 1,000 mcg into the muscle every 30 (thirty) days.) 3 mL 1  . diltiazem (CARDIZEM CD) 240 MG 24 hr capsule Take 1 capsule (240 mg total) by mouth daily. 90 capsule 3  . fluticasone furoate-vilanterol (BREO ELLIPTA) 200-25 MCG/INH AEPB Inhale 1 puff into the lungs daily. 1 each 5  . furosemide (LASIX) 20 MG tablet Take 20 mg  by mouth every other day.    [name] MAGNESIUM PO Take 1 tablet by mouth every other day.     . Melatonin 5 MG TABS Take 5 mg by mouth at bedtime as needed (for sleep).    . Misc. Devices (PULSE OXIMETER FOR FINGER) MISC 1 Units by Does not apply route as needed. 1 each 0  . Mouthwashes (BIOTENE DRY MOUTH GENTLE) LIQD Use as directed 1 Dose in the mouth or throat 2 (two) times daily as needed (dry mouth).    . Omega-3 Fatty Acids (FISH OIL) 1000 MG CAPS Take 1,000 mg by mouth daily as needed (pt prefrence).    [name] omeprazole (PRILOSEC) 40 MG capsule Take 1 capsule (40 mg total) by mouth daily as needed (heart burn).    . OXYGEN Inhale 1.5-2 L into the lungs See admin instructions. Uses at bedtime and through the day as needed    . PROAIR HFA 108 (90 Base) MCG/ACT inhaler Inhale 2 puffs into the lungs every 4 (four) hours as needed for wheezing or shortness of breath. 18 g 3  . senna (SENOKOT) 8.6 MG tablet Take 1 tablet by mouth daily as needed for constipation.    [name] spironolactone (ALDACTONE) 25 MG tablet TAKE 1 TABLET(25 MG) BY MOUTH DAILY 90 tablet 1  . SYRINGE/NEEDLE, DISP, 1 ML (B-D SYRINGE/NEEDLE 1CC/25GX5/8) 25G X 5/8" 1 ML MISC 1 Units by Does not apply route every 30 (thirty) days. 50 each 0  . vitamin A 3 MG (10000 UNITS) capsule Take 10,000 Units by mouth daily.    Alveda Reasons 20 MG TABS tablet TAKE 1 TABLET(20 MG) BY MOUTH DAILY WITH SUPPER (Patient taking differently: Take 20 mg by mouth daily.) 90 tablet 3  . zinc gluconate 50 MG tablet Take 50 mg by mouth daily.    . furosemide (LASIX) 20 MG tablet Take 1 tablet (20 mg total) by mouth every other day. 30 tablet 2  . doxycycline (DORYX) 100 MG EC tablet Take 1 tablet (100 mg total) by mouth daily.    . polyethylene glycol (MIRALAX / GLYCOLAX) 17 g packet Take 17 g by mouth daily as needed for mild constipation. 14 each   . predniSONE (DELTASONE) 20 MG tablet Take 20 mg daily     No facility-administered medications prior to visit.  Review of Systems:   Constitutional:   No  weight loss, night sweats,  Fevers, chills,  +fatigue, or  lassitude.  HEENT:   No headaches,  Difficulty swallowing,  Tooth/dental problems, or  Sore throat,                No sneezing, itching, ear ache, nasal congestion, post nasal drip,   CV:  No chest pain,  Orthopnea, PND, swelling in lower extremities, anasarca, dizziness, palpitations, syncope.   GI  No heartburn, indigestion, abdominal pain, nausea, vomiting, diarrhea, change in bowel habits, loss of appetite, bloody stools.   Resp:  No chest wall deformity  Skin: no rash or lesions.  GU: no dysuria, change in color of urine, no urgency or frequency.  No flank pain, no hematuria   MS: Back pain+    Physical Exam  BP (!) 140/92 (BP Location: Left Arm, Cuff Size: Large)   Pulse (!) 103   Temp 98.3 F (36.8 C) (Other (Comment)) Comment (Src): wrist  Ht 5\' 4"  (1.626 m)   Wt 230 lb (104.3 kg)   SpO2 92%   BMI 39.48 kg/m   GEN: A/Ox3; pleasant , NAD, well nourished    HEENT:  [place]/AT,   , NOSE-clear, THROAT-clear, no lesions, no postnasal drip or exudate noted.   NECK:  Supple w/ fair ROM; no JVD; normal carotid impulses w/o bruits; no thyromegaly or nodules palpated; no lymphadenopathy.    RESP  Clear  P & A; w/o, wheezes/ rales/ or rhonchi. no accessory muscle use, no dullness to percussion  CARD:  RRR, no m/r/g, no peripheral edema, pulses intact, no cyanosis or clubbing.  GI:   Soft & nt; nml bowel sounds; no organomegaly or masses detected.   Musco: Warm bil, no deformities or joint swelling noted.  Patient has point tenderness along the mid posterior thorax and positive radiating pains and tenderness along the left lateral ribs underneath the left breast.  No skin lesions or rashes noted.  No deformity noted.  Neuro: alert, no focal deficits noted.    Skin: Warm, no lesions or rashes    Lab Results:  CBC    Component Value Date/Time   WBC 6.7  08/27/2020 0742   RBC 3.88 08/27/2020 0742   HGB 12.3 08/27/2020 0742   HGB 12.4 08/20/2020 1232   HGB 12.3 02/23/2019 1100   HCT 37.7 08/27/2020 0742   HCT 36.3 02/23/2019 1100   PLT 190 08/27/2020 0742   PLT 189 08/20/2020 1232   PLT 181 02/23/2019 1100   MCV 97.2 08/27/2020 0742   MCV 89 02/23/2019 1100   MCH 31.7 08/27/2020 0742   MCHC 32.6 08/27/2020 0742   RDW 12.9 08/27/2020 0742   RDW 13.3 02/23/2019 1100   LYMPHSABS 1.3 08/27/2020 0742   LYMPHSABS 1.2 06/29/2018 0942   MONOABS 0.6 08/27/2020 0742   EOSABS 0.1 08/27/2020 0742   EOSABS 0.1 06/29/2018 0942   BASOSABS 0.0 08/27/2020 0742   BASOSABS 0.0 06/29/2018 0942    BMET    Component Value Date/Time   NA 135 08/27/2020 0742   NA 142 02/22/2020 1122   K 4.8 08/27/2020 0742   CL 97 (L) 08/27/2020 0742   CO2 30 08/27/2020 0742   GLUCOSE 118 (H) 08/27/2020 0742   BUN 18 08/27/2020 0742   BUN 18 02/22/2020 1122   CREATININE 1.53 (H) 08/27/2020 0742   CREATININE 1.41 (H) 08/20/2020 1232   CREATININE 1.36 (H) 05/24/2020 1008   CALCIUM 10.2 08/27/2020 0742  GFRNONAA 37 (L) 08/27/2020 0742   GFRNONAA 41 (L) 08/20/2020 1232   GFRNONAA 40 (L) 05/24/2020 1008   GFRAA 47 (L) 05/24/2020 1008    BNP    Component Value Date/Time   BNP 73.6 02/15/2020 0324   BNP 26 11/03/2018 1430    ProBNP No results found for: PROBNP  Imaging: DG Chest 2 View  Result Date: 08/27/2020 CLINICAL DATA:  short of breath EXAM: CHEST - 2 VIEW COMPARISON:  08/21/2020 and prior. FINDINGS: Decreased conspicuity of left lung consolidation, likely reflecting post treatment sequela. Patchy right basilar opacities are new since prior exam. No pneumothorax or pleural effusion. Stable cardiomediastinal silhouette. Multilevel spondylosis. IMPRESSION: Patchy right basilar opacities, atelectasis versus infiltrate. Post treatment appearance of the left lung. Electronically Signed   By: Primitivo Gauze M.D.   On: 08/27/2020 07:58   CT CHEST WO  CONTRAST  Result Date: 08/21/2020 CLINICAL DATA:  Chest pain, back pain. Non-small cell lung cancer undergoing radiation therapy EXAM: CT CHEST WITHOUT CONTRAST TECHNIQUE: Multidetector CT imaging of the chest was performed following the standard protocol without IV contrast. COMPARISON:  01/10/2020, PET-CT 01/25/2020 FINDINGS: Cardiovascular: Moderate multi-vessel coronary artery calcification. Mild global cardiomegaly, unchanged. No pericardial effusion. Central pulmonary arteries are enlarged in keeping with changes of pulmonary arterial hypertension. Stable dilation of the thoracic aorta, measuring 4.2 cm in diameter in its ascending segment, 3.4 cm in diameter within the arch just beyond the takeoff of the left subclavian artery, and 2.8 cm in diameter at the level of the left atrium. Mild superimposed atherosclerotic calcification within the thoracic aorta. Mediastinum/Nodes: No pathologic thoracic adenopathy. Esophagus unremarkable. Lungs/Pleura: There is developing consolidation within the left upper lobe likely reflecting changes of radiation therapy. Fiducial markers are noted within the left upper lobe. The previously noted pulmonary mass is difficult to discretely identify separate from the area of consolidation, particularly in absence of contrast administration. No new focal pulmonary nodules or infiltrates. No pneumothorax or pleural effusion. There is diffuse bronchial wall thickening in keeping with changes of chronic airway inflammation. Minimal scarring at the right lung base is again noted. No central obstructing lesion. Upper Abdomen: Multiple hyperdense exophytic lesions arise from the upper pole of the visualized left kidney which appears stable since prior examinations of 11/22/2017 and likely represent multiple hyperdense renal cysts. Simple cortical cyst noted within the upper pole of the visualized right kidney. No acute abnormality within the visualized upper abdomen. Musculoskeletal:  No lytic or blastic bone lesions are identified within the a thorax. IMPRESSION: No definite radiographic explanation for the patient's reported symptoms. Developing consolidation within the left upper lobe almost certainly representing changes related to radiation therapy. The previously noted pulmonary mass is now difficult to discretely measure separate from the area of consolidation but appears reduced in size in keeping with interval response to therapy. Discrete evaluation may be improved with contrast administration on subsequent examination. No evidence of regional normal or distant metastatic disease within the thorax. Stable changes of chronic airway inflammation. Stable dilation of the thoracic aorta. Recommend annual imaging followup by CTA or MRA. This recommendation follows 2010 ACCF/AHA/AATS/ACR/ASA/SCA/SCAI/SIR/STS/SVM Guidelines for the Diagnosis and Management of Patients with Thoracic Aortic Disease. Circulation. 2010; 121: Q734-L937. Aortic aneurysm NOS (ICD10-I71.9) Aortic aneurysm NOS (ICD10-I71.9). Electronically Signed   By: Fidela Salisbury MD   On: 08/21/2020 01:46      PFT Results Latest Ref Rng & Units 12/04/2015  FVC-Predicted Pre % 49  FVC-Post L 1.45  FVC-Predicted Post % 55  Pre FEV1/FVC % % 50  Post FEV1/FCV % % 55  FEV1-Pre L 0.64  FEV1-Predicted Pre % 31  FEV1-Post L 0.80  DLCO uncorrected ml/min/mmHg 16.04  DLCO UNC% % 66  DLCO corrected ml/min/mmHg 16.29  DLCO COR %Predicted % 67  DLVA Predicted % 109    No results found for: NITRICOXIDE      Assessment & Plan:   Moderate persistent asthma with exacerbation Recent exacerbation with possible asthmatic bronchitic exacerbation versus early pneumonia.  Patient clinically is improved.  She does have a follow-up CT scan later this week. No further antibiotics at this time.  Patient is continue on her current regimen.  As previously noted patient has been tried on multiple inhalers with no perceived benefit.   She is continue with her pulmonary hygiene regimen.  Plan  Patient Instructions  Follow up Dr. Sondra Come as planned  Thoracic Xray  Refer to Ortho for back pain.  Stop Ibuprofen .  Tylenol As needed for pain .  Heat and ice to back and ribs As needed   Vicodin at bedtime as needed for severe pain (do not take with tylenol )  Mucinex DM Twice daily  As needed  Cough/congestion  Saline nasal spray and gel Twice daily.  Albuterol Neb Twice daily.  Continue on Oxygen 2l/m with activity and At bedtime  .  Albuterol Inhaler /  Neb every 4-6 hr as needed wheezing .  Activity as tolerated.  Continue on Xarelto.  Follow up with Dr. Julien Nordmann as discussed.  Continue on Oxygen 2l/m with activity and At bedtime   Follow up with Dr. Elsworth Soho  In 2 months or Nakeshia Waldeck NP.  Please contact office for sooner follow up if symptoms do not improve or worsen or seek emergency care        Chronic respiratory failure with hypoxia ([place]) No increased oxygen demands.  Patient is continue on oxygen with activity and at bedtime.  Adenocarcinoma of lung ([place]) Stage Ia adenocarcinoma status post SBRT June 2021. Most recent CT chest showed radiation changes in the left upper lobe. Patient has another repeat high-resolution CT chest this week.  Patient is to follow-up with radiation oncology as planned in early February.  Patient is also advised to follow-up with medical oncology as she missed her previous appointment.  Acute back pain Patient has ongoing left-sided back pain with radiation along the left lateral ribs.  Patient has point tenderness, pain with movement and inspiration.  Recent CT chest showed no acute process to explain pain.  She has another repeat CT later this week. This could possibly be musculoskeletal in nature.  We will begin with a T-spine x-ray.  Referred to orthopedics. Advised to alternate ice and heat.  May take Tylenol as needed.  Did give Vicodin No. 10 tablets for severe pain.  Long  discussion regarding narcotic pain use and potential side effects.  No refills to be given.  Plan  Patient Instructions  Follow up Dr. Sondra Come as planned  Thoracic Xray  Refer to Ortho for back pain.  Stop Ibuprofen .  Tylenol As needed for pain .  Heat and ice to back and ribs As needed   Vicodin at bedtime as needed for severe pain (do not take with tylenol )  Mucinex DM Twice daily  As needed  Cough/congestion  Saline nasal spray and gel Twice daily.  Albuterol Neb Twice daily.  Continue on Oxygen 2l/m with activity and At bedtime  .  Albuterol Inhaler /  Neb every 4-6 hr as needed wheezing .  Activity as tolerated.  Continue on Xarelto.  Follow up with Dr. Julien Nordmann as discussed.  Continue on Oxygen 2l/m with activity and At bedtime   Follow up with Dr. Elsworth Soho  In 2 months or Davan Hark NP.  Please contact office for sooner follow up if symptoms do not improve or worsen or seek emergency care           Rexene Edison, NP 09/17/2020

## 2020-09-17 NOTE — Patient Instructions (Addendum)
Follow up Dr. Sondra Come as planned  Thoracic Xray  Refer to Ortho for back pain.  Stop Ibuprofen .  Tylenol As needed for pain .  Heat and ice to back and ribs As needed   Vicodin at bedtime as needed for severe pain (do not take with tylenol )  Mucinex DM Twice daily  As needed  Cough/congestion  Saline nasal spray and gel Twice daily.  Albuterol Neb Twice daily.  Continue on Oxygen 2l/m with activity and At bedtime  .  Albuterol Inhaler /  Neb every 4-6 hr as needed wheezing .  Activity as tolerated.  Continue on Xarelto.  Follow up with Dr. Julien Nordmann as discussed.  Continue on Oxygen 2l/m with activity and At bedtime   Follow up with Dr. Elsworth Soho  In 2 months or [place] NP.  Please contact office for sooner follow up if symptoms do not improve or worsen or seek emergency care

## 2020-09-17 NOTE — Assessment & Plan Note (Signed)
Recent exacerbation with possible asthmatic bronchitic exacerbation versus early pneumonia.  Patient clinically is improved.  She does have a follow-up CT scan later this week. No further antibiotics at this time.  Patient is continue on her current regimen.  As previously noted patient has been tried on multiple inhalers with no perceived benefit.  She is continue with her pulmonary hygiene regimen.  Plan  Patient Instructions  Follow up Dr. Sondra Come as planned  Thoracic Xray  Refer to Ortho for back pain.  Stop Ibuprofen .  Tylenol As needed for pain .  Heat and ice to back and ribs As needed   Vicodin at bedtime as needed for severe pain (do not take with tylenol )  Mucinex DM Twice daily  As needed  Cough/congestion  Saline nasal spray and gel Twice daily.  Albuterol Neb Twice daily.  Continue on Oxygen 2l/m with activity and At bedtime  .  Albuterol Inhaler /  Neb every 4-6 hr as needed wheezing .  Activity as tolerated.  Continue on Xarelto.  Follow up with Dr. Julien Nordmann as discussed.  Continue on Oxygen 2l/m with activity and At bedtime   Follow up with Dr. Elsworth Soho  In 2 months or Walid Haig NP.  Please contact office for sooner follow up if symptoms do not improve or worsen or seek emergency care

## 2020-09-17 NOTE — Assessment & Plan Note (Signed)
Patient has ongoing left-sided back pain with radiation along the left lateral ribs.  Patient has point tenderness, pain with movement and inspiration.  Recent CT chest showed no acute process to explain pain.  She has another repeat CT later this week. This could possibly be musculoskeletal in nature.  We will begin with a T-spine x-ray.  Referred to orthopedics. Advised to alternate ice and heat.  May take Tylenol as needed.  Did give Vicodin No. 10 tablets for severe pain.  Long discussion regarding narcotic pain use and potential side effects.  No refills to be given.  Plan  Patient Instructions  Follow up Dr. Sondra Come as planned  Thoracic Xray  Refer to Ortho for back pain.  Stop Ibuprofen .  Tylenol As needed for pain .  Heat and ice to back and ribs As needed   Vicodin at bedtime as needed for severe pain (do not take with tylenol )  Mucinex DM Twice daily  As needed  Cough/congestion  Saline nasal spray and gel Twice daily.  Albuterol Neb Twice daily.  Continue on Oxygen 2l/m with activity and At bedtime  .  Albuterol Inhaler /  Neb every 4-6 hr as needed wheezing .  Activity as tolerated.  Continue on Xarelto.  Follow up with Dr. Julien Nordmann as discussed.  Continue on Oxygen 2l/m with activity and At bedtime   Follow up with Dr. Elsworth Soho  In 2 months or Manasseh Pittsley NP.  Please contact office for sooner follow up if symptoms do not improve or worsen or seek emergency care

## 2020-09-17 NOTE — Assessment & Plan Note (Signed)
Stage Ia adenocarcinoma status post SBRT June 2021. Most recent CT chest showed radiation changes in the left upper lobe. Patient has another repeat high-resolution CT chest this week.  Patient is to follow-up with radiation oncology as planned in early February.  Patient is also advised to follow-up with medical oncology as she missed her previous appointment.

## 2020-09-19 ENCOUNTER — Ambulatory Visit (HOSPITAL_COMMUNITY)
Admission: RE | Admit: 2020-09-19 | Discharge: 2020-09-19 | Disposition: A | Discharge status: Home / Self Care | Payer: Medicare Other | Source: Ambulatory Visit | Attending: Internal Medicine | Admitting: Internal Medicine

## 2020-09-19 ENCOUNTER — Other Ambulatory Visit: Payer: Self-pay

## 2020-09-19 DIAGNOSIS — R0602 Shortness of breath: Secondary | ICD-10-CM | POA: Diagnosis not present

## 2020-09-20 ENCOUNTER — Ambulatory Visit: Payer: Medicare Other | Admitting: Family Medicine

## 2020-09-20 ENCOUNTER — Telehealth: Payer: Self-pay | Admitting: Adult Health

## 2020-09-20 DIAGNOSIS — J449 Chronic obstructive pulmonary disease, unspecified: Secondary | ICD-10-CM | POA: Diagnosis not present

## 2020-09-20 DIAGNOSIS — R269 Unspecified abnormalities of gait and mobility: Secondary | ICD-10-CM | POA: Diagnosis not present

## 2020-09-20 DIAGNOSIS — J45909 Unspecified asthma, uncomplicated: Secondary | ICD-10-CM | POA: Diagnosis not present

## 2020-09-20 DIAGNOSIS — R911 Solitary pulmonary nodule: Secondary | ICD-10-CM | POA: Diagnosis not present

## 2020-09-20 MED ORDER — AZITHROMYCIN 250 MG PO TABS: ORAL_TABLET | ORAL | 0 refills | Status: AC

## 2020-09-20 NOTE — Telephone Encounter (Signed)
That is fine . Zpack sent   Please contact office for sooner follow up if symptoms do not improve or worsen or seek emergency care    Did she get the xray we discussed from the office

## 2020-09-20 NOTE — Telephone Encounter (Signed)
Patient aware of rx being sent to pharmacy.  Pt did not get spine xray today, as she did not feel well enough to go her ortho appt.

## 2020-09-20 NOTE — Telephone Encounter (Signed)
Spoke with pt, requesting a zpak.  States she has had sinus congestion, pnd, headache X several days.  Denies fever, chest congestion, loss of taste/smell, nausea/vomiting/diarhhea.  Pt states she made TP aware of this on 09/17/20 OV but per note TP states no further abx at this time.  Pt is asking TP to reconsider- requests zpak specifically. Pt has not taken any OTC meds to help with this.   Pharmacy: [facility] on [address].    TP please advise, thanks!

## 2020-09-20 NOTE — Telephone Encounter (Signed)
Needs keep Ortho ov

## 2020-09-24 ENCOUNTER — Ambulatory Visit: Payer: Medicare Other | Admitting: Family Medicine

## 2020-09-25 ENCOUNTER — Other Ambulatory Visit: Payer: Self-pay | Admitting: Adult Health

## 2020-09-27 ENCOUNTER — Encounter: Payer: Self-pay | Admitting: Internal Medicine

## 2020-09-27 ENCOUNTER — Ambulatory Visit: Payer: Medicare Other | Admitting: *Deleted

## 2020-09-27 DIAGNOSIS — J4489 Other specified chronic obstructive pulmonary disease: Secondary | ICD-10-CM

## 2020-09-27 DIAGNOSIS — N183 Chronic kidney disease, stage 3 unspecified: Secondary | ICD-10-CM

## 2020-09-27 DIAGNOSIS — C3492 Malignant neoplasm of unspecified part of left bronchus or lung: Secondary | ICD-10-CM

## 2020-09-27 DIAGNOSIS — I10 Essential (primary) hypertension: Secondary | ICD-10-CM

## 2020-09-27 DIAGNOSIS — I5032 Chronic diastolic (congestive) heart failure: Secondary | ICD-10-CM

## 2020-09-27 DIAGNOSIS — J449 Chronic obstructive pulmonary disease, unspecified: Secondary | ICD-10-CM
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
## 2020-09-30 ENCOUNTER — Other Ambulatory Visit: Payer: Self-pay

## 2020-09-30 ENCOUNTER — Telehealth: Payer: Medicare Other

## 2020-09-30 ENCOUNTER — Ambulatory Visit
Admission: RE | Admit: 2020-09-30 | Discharge: 2020-09-30 | Disposition: A | Discharge status: Home / Self Care | Payer: Medicare Other | Source: Ambulatory Visit | Attending: Radiation Oncology | Admitting: Radiation Oncology

## 2020-09-30 ENCOUNTER — Telehealth: Payer: Self-pay | Admitting: *Deleted

## 2020-09-30 DIAGNOSIS — I700 Atherosclerosis of aorta: Secondary | ICD-10-CM | POA: Insufficient documentation

## 2020-09-30 DIAGNOSIS — Z79899 Other long term (current) drug therapy: Secondary | ICD-10-CM | POA: Diagnosis not present

## 2020-09-30 DIAGNOSIS — R635 Abnormal weight gain: Secondary | ICD-10-CM | POA: Diagnosis not present

## 2020-09-30 DIAGNOSIS — R911 Solitary pulmonary nodule: Secondary | ICD-10-CM

## 2020-09-30 DIAGNOSIS — C3412 Malignant neoplasm of upper lobe, left bronchus or lung: Secondary | ICD-10-CM | POA: Diagnosis not present

## 2020-09-30 DIAGNOSIS — Z7901 Long term (current) use of anticoagulants: Secondary | ICD-10-CM | POA: Insufficient documentation

## 2020-09-30 DIAGNOSIS — I2510 Atherosclerotic heart disease of native coronary artery without angina pectoris: Secondary | ICD-10-CM | POA: Diagnosis not present

## 2020-09-30 DIAGNOSIS — Z08 Encounter for follow-up examination after completed treatment for malignant neoplasm: Secondary | ICD-10-CM | POA: Diagnosis not present

## 2020-09-30 DIAGNOSIS — C3492 Malignant neoplasm of unspecified part of left bronchus or lung: Secondary | ICD-10-CM

## 2020-09-30 NOTE — Telephone Encounter (Signed)
  Chronic Care Management   Outreach Note  09/30/2020 Name: NNEOMA HARRAL MRN: [id number] DOB: [birth year]  Referred by: [name], MD Reason for referral : Chronic Care Management (HTN, CAD, HLD, CKD, Asthma, COPD, lung ca 6/21, HIV, HF)   A second unsuccessful telephone outreach was attempted today. The patient was referred to the case management team for assistance with care management and care coordination.     Follow Up Plan: A HIPAA compliant phone message was left for the patient providing contact information and requesting a return call.  The care management team will reach out to the patient again over the next 7-14 days.   Kelli Churn RN, CCM, [facility] RN Care Manager [phone]
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
## 2020-10-01 NOTE — Telephone Encounter (Signed)
Please let Shannon [name] know that can only give 1 prescription for narcotic pain medicines that she will need to follow-up with orthopedics as we discussed.  Please contact office for sooner follow up if symptoms do not improve or worsen or seek emergency care

## 2020-10-01 NOTE — Telephone Encounter (Signed)
I have called and spoke with pt and she is aware of TP recs.

## 2020-10-01 NOTE — Telephone Encounter (Signed)
Called and spoke to pt. Pt states she rescheduled her ortho appt for 2.15.22 and they are doing a spine xray then as wel. Pt is requesting a refill of Norco that was sent in on 1.25.22 for #10. Pt states she would like a refill just to last her to her appt.   Tammy, please advise. Thanks.

## 2020-10-02 ENCOUNTER — Inpatient Hospital Stay: Payer: Medicare Other | Admitting: Internal Medicine

## 2020-10-03 ENCOUNTER — Ambulatory Visit: Payer: Medicare Other | Admitting: Cardiovascular Disease

## 2020-10-04 ENCOUNTER — Other Ambulatory Visit: Payer: Self-pay

## 2020-10-04 ENCOUNTER — Other Ambulatory Visit: Payer: Self-pay | Admitting: Internal Medicine

## 2020-10-04 ENCOUNTER — Telehealth: Payer: Self-pay | Admitting: Pulmonary Disease

## 2020-10-04 ENCOUNTER — Other Ambulatory Visit: Payer: Medicare Other

## 2020-10-04 DIAGNOSIS — B20 Human immunodeficiency virus [HIV] disease: Secondary | ICD-10-CM

## 2020-10-04 NOTE — Telephone Encounter (Signed)
Shannon [name] can you find this paperwork . What is it for ?

## 2020-10-08 ENCOUNTER — Ambulatory Visit (INDEPENDENT_AMBULATORY_CARE_PROVIDER_SITE_OTHER): Payer: Medicare Other | Admitting: Family Medicine

## 2020-10-08 ENCOUNTER — Other Ambulatory Visit: Payer: Self-pay

## 2020-10-08 ENCOUNTER — Encounter: Payer: Self-pay | Admitting: Family Medicine

## 2020-10-08 DIAGNOSIS — M546 Pain in thoracic spine: Secondary | ICD-10-CM

## 2020-10-08 DIAGNOSIS — M542 Cervicalgia: Secondary | ICD-10-CM

## 2020-10-08 LAB — T-HELPER CELLS (CD4) COUNT (NOT AT ARMC)
Absolute CD4: 449 cells/uL — ABNORMAL LOW (ref 490–1740)
CD4 T Helper %: 47 % (ref 30–61)
Total lymphocyte count: 964 cells/uL (ref 850–3900)

## 2020-10-08 LAB — CBC WITH DIFFERENTIAL/PLATELET
Absolute Monocytes: 389 cells/uL (ref 200–950)
Basophils Absolute: 29 cells/uL (ref 0–200)
Basophils Relative: 0.5 %
Eosinophils Absolute: 151 cells/uL (ref 15–500)
Eosinophils Relative: 2.6 %
HCT: 35.6 % (ref 35.0–45.0)
Hemoglobin: 11.6 g/dL — ABNORMAL LOW (ref 11.7–15.5)
Lymphs Abs: 876 cells/uL (ref 850–3900)
MCH: 31.4 pg (ref 27.0–33.0)
MCHC: 32.6 g/dL (ref 32.0–36.0)
MCV: 96.2 fL (ref 80.0–100.0)
MPV: 10.7 fL (ref 7.5–12.5)
Monocytes Relative: 6.7 %
Neutro Abs: 4356 cells/uL (ref 1500–7800)
Neutrophils Relative %: 75.1 %
Platelets: 216 10*3/uL (ref 140–400)
RBC: 3.7 10*6/uL — ABNORMAL LOW (ref 3.80–5.10)
RDW: 12.6 % (ref 11.0–15.0)
Total Lymphocyte: 15.1 %
WBC: 5.8 10*3/uL (ref 3.8–10.8)

## 2020-10-08 LAB — COMPREHENSIVE METABOLIC PANEL
AG Ratio: 1.6 (calc) (ref 1.0–2.5)
ALT: 40 U/L — ABNORMAL HIGH (ref 6–29)
AST: 30 U/L (ref 10–35)
Albumin: 3.9 g/dL (ref 3.6–5.1)
Alkaline phosphatase (APISO): 69 U/L (ref 37–153)
BUN/Creatinine Ratio: 13 (calc) (ref 6–22)
BUN: 20 mg/dL (ref 7–25)
CO2: 37 mmol/L — ABNORMAL HIGH (ref 20–32)
Calcium: 9.7 mg/dL (ref 8.6–10.4)
Chloride: 99 mmol/L (ref 98–110)
Creat: 1.5 mg/dL — ABNORMAL HIGH (ref 0.50–0.99)
Globulin: 2.4 g/dL (calc) (ref 1.9–3.7)
Glucose, Bld: 213 mg/dL — ABNORMAL HIGH (ref 65–99)
Potassium: 4.3 mmol/L (ref 3.5–5.3)
Sodium: 141 mmol/L (ref 135–146)
Total Bilirubin: 0.4 mg/dL (ref 0.2–1.2)
Total Protein: 6.3 g/dL (ref 6.1–8.1)

## 2020-10-08 LAB — HIV-1 RNA QUANT-NO REFLEX-BLD
HIV 1 RNA Quant: <20 Copies/mL
HIV-1 RNA Quant, Log: <1.3 Log cps/mL

## 2020-10-08 MED ORDER — BACLOFEN 10 MG PO TABS: 5.0000 mg | ORAL_TABLET | Freq: Three times a day (TID) | ORAL | 3 refills | Status: DC | PRN

## 2020-10-08 NOTE — Progress Notes (Signed)
Office Visit Note   Patient: Shannon [name]           Date of Birth: 02/13/1956           MRN: [id number] Visit Date: 10/08/2020 Requested by: Melvenia Needles, NP [address] [place],  [place] [address] PCP: [name], MD  Subjective: Chief Complaint  Patient presents with  . Other    Pain in the left flank and up under her left breast. S/p lung cancer/radiation treatments in 2020 and pneumonia within the last couple months. Has had recent lab work.    HPI: She is here with left-sided neck and shoulder pain, and chest wall pain.  Symptoms started about 2 or 3 weeks ago, no injury.  She has a history of lung cancer status post radiation treatments in 2020.  She also had pneumonia in the past couple months.  She started having this pain and now keeps her from sleeping well.  Pain radiates into her head, and from the lateral chest around to the breast.  She has not been taking anything for it specifically.                ROS:   All other systems were reviewed and are negative.  Objective: Vital Signs: There were no vitals taken for this visit.  Physical Exam:  General:  Alert and oriented, in no acute distress. Pulm:  Breathing unlabored. Psy:  Normal mood, congruent affect. Skin: No visible rash Neck: She has tender trigger points in the trapezius muscle belly on the left.  She is also tender in the left cervical paraspinous muscles and the left rhomboid area.  Upper extremity strength and reflexes are normal.  Left chest wall is tender laterally below the breast.  Imaging: No x-rays today but recent CT images were reviewed on computer showing some thoracic degenerative changes but no sign of neoplasm.   Assessment & Plan: 1.  Probable myofascial neck and left shoulder pain -We will try baclofen, physical therapy.  Follow-up as needed.     Procedures: No procedures performed        PMFS History: Patient Active Problem List   Diagnosis Date Noted   . Acute back pain 09/17/2020  . Shoulder blade pain 08/09/2020  . Skin ulcer ([place]) 06/24/2020  . Bronchitis 03/07/2020  . Costochondritis, acute 03/07/2020  . Pulmonary nodule 02/14/2020  . Nodule of upper lobe of left lung 02/06/2020  . Thyroid nodule 01/26/2020  . Adenocarcinoma of lung ([place]) 01/04/2020  . Chronic obstructive asthma ([place]) 12/29/2019  . Myalgia 10/24/2019  . (HFpEF) heart failure with preserved ejection fraction ([place]) 02/07/2019  . Leg swelling 11/03/2018  . Other fatigue 06/30/2018  . Shortness of breath on exertion 06/30/2018  . Hyperglycemia 06/30/2018  . Vitamin D deficiency 06/30/2018  . Chronic anticoagulation 01/03/2018  . Chronic respiratory failure with hypoxia ([place]) 11/30/2017  . Constipation 11/03/2017  . Atrial flutter ([place]) 03/11/2017  . CAD (coronary artery disease) 01/28/2017  . Obesity (BMI 30-39.9) 01/13/2017  . Ascending aorta dilatation (HCC) 01/07/2017  . Low back pain radiating to right lower extremity 12/02/2016  . Acquired cyst of kidney 12/02/2016  . Chronic kidney disease (CKD), stage III (moderate) ([place]) 12/01/2016  . Generalized anxiety disorder 10/22/2016  . Hypersomnia 10/01/2016  . Accessory skin tags 06/10/2016  . Nocturnal hypoxemia 05/13/2016  . Cervical radiculopathy 04/14/2016  . GERD (gastroesophageal reflux disease) 01/02/2016  . Vitamin B12 deficiency 10/02/2015  . Moderate persistent  asthma with exacerbation 07/09/2015  . HIV disease ([place]) 07/09/2015  . Uncontrolled hypertension 07/09/2015   Past Medical History:  Diagnosis Date  . Anemia   . Anxiety    HX PANIC ATTACKS  . Arthritis    "starting to; in my hands" (07/09/2015)  . Asthma   . Atrial fibrillation ([place])   . Atrial flutter, paroxysmal ([place])   . Bloated abdomen   . CFS (chronic fatigue syndrome)   . Chewing difficulty   . Chronic asthma with acute exacerbation    "I have chronic asthma all the time; sometimes exacerbations" (07/09/2015)  . Chronic  lower back pain   . COPD (chronic obstructive pulmonary disease) ([place])   . Cyst of right kidney    "3 of them; dx'd in ~ 01/2015"  . Dyspnea   . GERD (gastroesophageal reflux disease)   . Heart murmur   . History of blood transfusion    "related to my brain surgery I think"  . History of pulmonary embolism 07/09/2015  . HIV antibody positive ([place])   . HIV disease ([place])   . Hyperlipidemia   . Hypertension   . Leg edema   . Lipodystrophy   . Mild CAD 2013  . Multiple thyroid nodules   . Osteopenia   . Palpitations   . Pneumonia 07/09/2015  . Shingles   . Sleep apnea    "never completed part 2 of study; never wore mask" (07/09/2015)  . Vitamin B 12 deficiency   . Vitamin D deficiency     Family History  Problem Relation Age of Onset  . Asthma Mother   . Heart failure Mother        cardiomyopathy  . Thyroid disease Mother   . Heart disease Mother   . Obesity Mother   . Hypertension Mother   . Heart murmur Sister   . Heart murmur Brother   . Diabetes Sister   . Thyroid disease Sister   . Heart murmur Sister     Past Surgical History:  Procedure Laterality Date  . ABDOMINAL HYSTERECTOMY     "robotic laparosopic"  . BRAIN SURGERY  1974   "brain tumor; benign; on top of my brain; got a plate in there"  . BRAIN SURGERY     age 51- -"Tumor pushing my skullout"  . BRONCHIAL BIOPSY  02/13/2020   Procedure: BRONCHIAL BIOPSIES;  Surgeon: [name], MD;  Location: [facility] ENDOSCOPY;  Service: Pulmonary;;  . BRONCHIAL BRUSHINGS  02/13/2020   Procedure: BRONCHIAL BRUSHINGS;  Surgeon: [name], MD;  Location[facility]COPY;  Service: Pulmonary;;  . BRONCHIAL NEEDLE ASPIRATION BIOPSY  02/13/2020   Procedure: BRONCHIAL NEEDLE ASPIRATION BIOPSIES;  Surgeon: [name], MD;  Location: [facility] ENDOSCOPY;  Service: Pulmonary;;  . CARDIAC CATHETERIZATION    . FIDUCIAL MARKER PLACEMENT  02/13/2020   Procedure: FIDUCIAL MARKER PLACEMENT;  Surgeon: [name], MD;  Location: [facility]  ENDOSCOPY;  Service: Pulmonary;;  . TONSILLECTOMY AND ADENOIDECTOMY    . VIDEO BRONCHOSCOPY WITH ENDOBRONCHIAL NAVIGATION N/A 02/13/2020   Procedure: VIDEO BRONCHOSCOPY WITH ENDOBRONCHIAL NAVIGATION;  Surgeon: [name], MD;  Location: [facility] ENDOSCOPY;  Service: Pulmonary;  Laterality: N/A;   Social History   Occupational History  . Occupation: Ship broker  Tobacco Use  . Smoking status: Never Smoker  . Smokeless tobacco: Never Used  Vaping Use  . Vaping Use: Never used  Substance and Sexual Activity  . Alcohol use: Not Currently    Alcohol/week: 0.0 standard drinks  Comment: Rarely.  . Drug use: No  . Sexual activity: Not Currently    Partners: Male    Comment: declined condoms

## 2020-10-09 NOTE — Telephone Encounter (Signed)
The form was singed, but still needs to be completed  Will placed in Shannon [name]'s lookat folder in B pod  The forms are for assessment for personal care services  Thanks

## 2020-10-10 NOTE — Telephone Encounter (Signed)
Done

## 2020-10-10 NOTE — Telephone Encounter (Signed)
Form has been completed and signed by Rexene Edison NP, called and left a vm for Otila Kluver letting her know that it is completed and I will be faxing the form over this afternoon.  Advised to call the office with any questions/concerns.  Nothing further needed.

## 2020-10-13 DIAGNOSIS — G4733 Obstructive sleep apnea (adult) (pediatric): Secondary | ICD-10-CM | POA: Diagnosis not present

## 2020-10-13 DIAGNOSIS — J45909 Unspecified asthma, uncomplicated: Secondary | ICD-10-CM | POA: Diagnosis not present

## 2020-10-13 DIAGNOSIS — J449 Chronic obstructive pulmonary disease, unspecified: Secondary | ICD-10-CM | POA: Diagnosis not present

## 2020-10-13 DIAGNOSIS — R269 Unspecified abnormalities of gait and mobility: Secondary | ICD-10-CM | POA: Diagnosis not present

## 2020-10-14 ENCOUNTER — Ambulatory Visit: Payer: Medicare Other | Admitting: *Deleted

## 2020-10-14 DIAGNOSIS — J449 Chronic obstructive pulmonary disease, unspecified: Secondary | ICD-10-CM

## 2020-10-14 DIAGNOSIS — F411 Generalized anxiety disorder: Secondary | ICD-10-CM

## 2020-10-14 DIAGNOSIS — I5032 Chronic diastolic (congestive) heart failure: Secondary | ICD-10-CM

## 2020-10-14 DIAGNOSIS — I10 Essential (primary) hypertension: Secondary | ICD-10-CM

## 2020-10-14 DIAGNOSIS — C3492 Malignant neoplasm of unspecified part of left bronchus or lung: Secondary | ICD-10-CM

## 2020-10-14 DIAGNOSIS — N183 Chronic kidney disease, stage 3 unspecified: Secondary | ICD-10-CM

## 2020-10-14 DIAGNOSIS — J4489 Other specified chronic obstructive pulmonary disease: Secondary | ICD-10-CM
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
## 2020-10-15 NOTE — Progress Notes (Signed)
Internal Medicine Clinic Resident  I have personally reviewed this encounter including the documentation in this note and/or discussed this patient with the care management provider. I will address any urgent items identified by the care management provider and will communicate my actions to the patient's PCP. I have reviewed the patient's CCM visit with my supervising attending, Dr Evette Doffing.  Dewayne Hatch, MD 10/15/2020

## 2020-10-17 ENCOUNTER — Telehealth: Payer: Self-pay | Admitting: Behavioral Health

## 2020-10-17 ENCOUNTER — Ambulatory Visit: Payer: Medicare Other | Admitting: Behavioral Health

## 2020-10-17 ENCOUNTER — Other Ambulatory Visit: Payer: Self-pay

## 2020-10-17 DIAGNOSIS — F411 Generalized anxiety disorder: Secondary | ICD-10-CM
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
## 2020-10-18 ENCOUNTER — Other Ambulatory Visit: Payer: Self-pay

## 2020-10-18 ENCOUNTER — Telehealth: Payer: Medicare Other | Admitting: Internal Medicine

## 2020-10-21 ENCOUNTER — Telehealth: Payer: Self-pay

## 2020-10-21 DIAGNOSIS — R911 Solitary pulmonary nodule: Secondary | ICD-10-CM | POA: Diagnosis not present

## 2020-10-21 DIAGNOSIS — J449 Chronic obstructive pulmonary disease, unspecified: Secondary | ICD-10-CM | POA: Diagnosis not present

## 2020-10-21 DIAGNOSIS — J45909 Unspecified asthma, uncomplicated: Secondary | ICD-10-CM | POA: Diagnosis not present

## 2020-10-21 DIAGNOSIS — R269 Unspecified abnormalities of gait and mobility: Secondary | ICD-10-CM | POA: Diagnosis not present

## 2020-10-21 NOTE — Telephone Encounter (Signed)
Patient called office to reschedule missed appointment with [name]. States that she has not been feeling well lately and has multiple concerns she would like to discuss with provider. States that on Friday she experiencing chest pain. Took some Aspirin which helped pain resolve. Did not contact PCP regarding this concern or go to ED for evaluation. Is also having dry mouth, lack of sleep, and nausea.  Patient feels like medication changes could be the reason for her symptoms. Would like to talk to [name] before reaching out to ortho and PCP. Patient is also requesting referral for physical therapy that accepts her insurance.  Will forward message to MD.

## 2020-10-22 ENCOUNTER — Encounter: Payer: Self-pay | Admitting: Internal Medicine

## 2020-10-22 ENCOUNTER — Other Ambulatory Visit: Payer: Self-pay

## 2020-10-22 ENCOUNTER — Ambulatory Visit: Payer: Medicare Other | Admitting: Physical Therapy

## 2020-10-22 ENCOUNTER — Telehealth (INDEPENDENT_AMBULATORY_CARE_PROVIDER_SITE_OTHER): Payer: Medicare Other | Admitting: Internal Medicine

## 2020-10-22 DIAGNOSIS — I200 Unstable angina: Secondary | ICD-10-CM

## 2020-10-22 DIAGNOSIS — B20 Human immunodeficiency virus [HIV] disease: Secondary | ICD-10-CM

## 2020-10-22 DIAGNOSIS — R0602 Shortness of breath: Secondary | ICD-10-CM | POA: Diagnosis not present
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
## 2020-10-31 ENCOUNTER — Telehealth: Payer: Self-pay

## 2020-10-31 NOTE — Telephone Encounter (Signed)
You can put her on at 1045am on March 24 if that works for the clinic staffing.

## 2020-10-31 NOTE — Telephone Encounter (Signed)
Please call pt back about something and appt.
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
## 2020-11-01 NOTE — Telephone Encounter (Signed)
Appt scheduled 3/24 @ 1115 AM slot. Called pt - no answer; left message on self-identified vm appt with Dr Daryll Drown 3/24, arrive at 1100AM and to call back if she has questions.

## 2020-11-04 ENCOUNTER — Encounter: Payer: Medicare Other | Admitting: Student

## 2020-11-04 ENCOUNTER — Other Ambulatory Visit: Payer: Self-pay

## 2020-11-04 ENCOUNTER — Ambulatory Visit (INDEPENDENT_AMBULATORY_CARE_PROVIDER_SITE_OTHER): Payer: Medicare Other | Admitting: Student

## 2020-11-04 DIAGNOSIS — J40 Bronchitis, not specified as acute or chronic: Secondary | ICD-10-CM

## 2020-11-04 DIAGNOSIS — T2111XA Burn of first degree of chest wall, initial encounter: Secondary | ICD-10-CM

## 2020-11-04 MED ORDER — AZITHROMYCIN 250 MG PO TABS: ORAL_TABLET | ORAL | 0 refills | Status: AC

## 2020-11-04 NOTE — Progress Notes (Signed)
  [facility] Internal Medicine Residency Telephone Encounter Continuity Care Appointment  HPI:   This telephone encounter was created for [name] on 11/04/2020 for the following purpose/cc: burn on breast; productive cough, sinus pressure   Past Medical History:  Past Medical History:  Diagnosis Date  . Anemia   . Anxiety    HX PANIC ATTACKS  . Arthritis    "starting to; in my hands" (07/09/2015)  . Asthma   . Atrial fibrillation ([place])   . Atrial flutter, paroxysmal ([place])   . Bloated abdomen   . CFS (chronic fatigue syndrome)   . Chewing difficulty   . Chronic asthma with acute exacerbation    "I have chronic asthma all the time; sometimes exacerbations" (07/09/2015)  . Chronic lower back pain   . COPD (chronic obstructive pulmonary disease) ([place])   . Cyst of right kidney    "3 of them; dx'd in ~ 01/2015"  . Dyspnea   . GERD (gastroesophageal reflux disease)   . Heart murmur   . History of blood transfusion    "related to my brain surgery I think"  . History of pulmonary embolism 07/09/2015  . HIV antibody positive ([facility])   . HIV disease ([place])   . Hyperlipidemia   . Hypertension   . Leg edema   . Lipodystrophy   . Mild CAD 2013  . Multiple thyroid nodules   . Osteopenia   . Palpitations   . Pneumonia 07/09/2015  . Shingles   . Sleep apnea    "never completed part 2 of study; never wore mask" (07/09/2015)  . Vitamin B 12 deficiency   . Vitamin D deficiency       ROS:  Review of Systems  Constitutional: Positive for malaise/fatigue. Negative for chills and fever.  HENT: Positive for congestion, sinus pain and sore throat. Negative for ear discharge and nosebleeds.   Respiratory: Negative for shortness of breath.   Cardiovascular: Negative for chest pain.  Musculoskeletal: Negative for myalgias.  Neurological: Negative for dizziness, weakness and headaches.     Assessment / Plan / Recommendations:   Please see A&P under problem oriented charting  for assessment of the patient's acute and chronic medical conditions.   As always, pt is advised that if symptoms worsen or new symptoms arise, they should go to an urgent care facility or to to ER for further evaluation.   Consent and Medical Decision Making:   Patient discussed with Dr. Jimmye Norman  This is a telephone encounter between Shannon [name] and [place] on 11/04/2020 for the above chief complaints. The visit was conducted with the patient located at home and [place] Lodge at  [facility]. The patient's identity was confirmed using their DOB and current address. The patient has consented to being evaluated through a telephone encounter and understands the associated risks (an examination cannot be done and the patient may need to come in for an appointment) / benefits (allows the patient to remain at home, decreasing exposure to coronavirus). I personally spent 20 minutes on medical discussion.

## 2020-11-05 DIAGNOSIS — T2111XA Burn of first degree of chest wall, initial encounter: Secondary | ICD-10-CM | POA: Insufficient documentation

## 2020-11-05 NOTE — Assessment & Plan Note (Signed)
Patient reports via telehealth visit (telephone) a 2-3 day history of cough productive of green sputum, sore throat, sinus pressure, nasal congestion. She uses 1.5 L oxygen via nasal cannula continuously and has not had to increase her oxygen requirement. Denies fevers or chills.  Over the phone, she is not dyspneic with speaking, though voice is mildy hoarse.  Per chart review and her report, she was diagnosed and treated for community-acquired pneumonia on 08/27/20 with a course of doxycycline and steroids.   Assessment/Plan: Though I am unable to perform a physical exam via a telephone visit, given this patient's history of lung cancer and possible radiation-induced fibrosis in addition to asthma, she is at increased risk for bronchitis vs post-obstructive pneumonia. Therefore feel it is reasonable to treat with a course of azithromycin and monitor for improvement of symptoms. Additionally, advised patient to get tested for COVID and report back her results because although she received her 2-vaccine series, she would be at increased risk of severe disease given her comorbid medical conditions. - Azithromycin 500 mg for 1 day, followed by 250 mg daily for days 2-5 - Instructed patient to get tested for COVID - Return precautions given - Patient is scheduled for follow-up with her PCP, Dr. Daryll Drown, on 11/14/20
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
## 2020-11-06 ENCOUNTER — Ambulatory Visit: Payer: Medicare Other | Admitting: Cardiovascular Disease

## 2020-11-07 ENCOUNTER — Ambulatory Visit: Payer: Medicare Other | Admitting: Behavioral Health

## 2020-11-08 ENCOUNTER — Ambulatory Visit: Payer: Medicare Other | Admitting: Physical Therapy

## 2020-11-10 DIAGNOSIS — G4733 Obstructive sleep apnea (adult) (pediatric): Secondary | ICD-10-CM | POA: Diagnosis not present

## 2020-11-10 DIAGNOSIS — J45909 Unspecified asthma, uncomplicated: Secondary | ICD-10-CM | POA: Diagnosis not present

## 2020-11-10 DIAGNOSIS — R269 Unspecified abnormalities of gait and mobility: Secondary | ICD-10-CM | POA: Diagnosis not present

## 2020-11-10 DIAGNOSIS — J449 Chronic obstructive pulmonary disease, unspecified: Secondary | ICD-10-CM | POA: Diagnosis not present

## 2020-11-11 ENCOUNTER — Telehealth: Payer: Self-pay | Admitting: *Deleted

## 2020-11-11 ENCOUNTER — Telehealth: Payer: Medicare Other

## 2020-11-11 NOTE — Telephone Encounter (Signed)
  Chronic Care Management   Outreach Note  11/11/2020 Name: Shannon [name] MRN: [id number] DOB: [birth year]  Referred by: [name], MD Reason for referral : Chronic Care Management (HTN, CAD, HLD, CKD, Asthma, COPD, lung ca 6/21, HIV, HF)   An unsuccessful telephone outreach was attempted today. The patient was referred to the case management team for assistance with care management and care coordination.   Follow Up Plan: A HIPAA compliant phone message was left for the patient providing contact information and requesting a return call.  The care management team will reach out to the patient again over the next 7 days.   Kelli Churn RN, CCM, [facility] RN Care Manager [phone]

## 2020-11-12 DIAGNOSIS — H5213 Myopia, bilateral: Secondary | ICD-10-CM | POA: Diagnosis not present

## 2020-11-14 ENCOUNTER — Ambulatory Visit (INDEPENDENT_AMBULATORY_CARE_PROVIDER_SITE_OTHER): Payer: Medicare Other | Admitting: Internal Medicine

## 2020-11-14 ENCOUNTER — Other Ambulatory Visit: Payer: Self-pay

## 2020-11-14 DIAGNOSIS — I77810 Thoracic aortic ectasia: Secondary | ICD-10-CM | POA: Diagnosis not present

## 2020-11-14 DIAGNOSIS — B20 Human immunodeficiency virus [HIV] disease: Secondary | ICD-10-CM | POA: Diagnosis not present

## 2020-11-14 DIAGNOSIS — M546 Pain in thoracic spine: Secondary | ICD-10-CM

## 2020-11-14 DIAGNOSIS — J9611 Chronic respiratory failure with hypoxia: Secondary | ICD-10-CM | POA: Diagnosis not present

## 2020-11-14 DIAGNOSIS — I483 Typical atrial flutter: Secondary | ICD-10-CM

## 2020-11-14 DIAGNOSIS — G4700 Insomnia, unspecified: Secondary | ICD-10-CM | POA: Insufficient documentation

## 2020-11-14 DIAGNOSIS — I10 Essential (primary) hypertension: Secondary | ICD-10-CM | POA: Diagnosis not present

## 2020-11-14 DIAGNOSIS — I5032 Chronic diastolic (congestive) heart failure: Secondary | ICD-10-CM

## 2020-11-14 DIAGNOSIS — G4709 Other insomnia: Secondary | ICD-10-CM | POA: Diagnosis not present

## 2020-11-14 DIAGNOSIS — G4734 Idiopathic sleep related nonobstructive alveolar hypoventilation: Secondary | ICD-10-CM

## 2020-11-14 MED ORDER — CYCLOBENZAPRINE HCL 5 MG PO TABS: 5.0000 mg | ORAL_TABLET | Freq: Three times a day (TID) | ORAL | 2 refills | Status: DC | PRN

## 2020-11-14 NOTE — Assessment & Plan Note (Signed)
She is now wearing her oxygen during the day.  Will discuss with pulmonary about possibility of pulmonary rehab.

## 2020-11-14 NOTE — Assessment & Plan Note (Signed)
She is wearing her oxygen in the day and night.  She has difficulty with getting up and down her stairs in her townhome.  She is requesting increased number of hours for personal care services.  I will fill out this paperwork for her.

## 2020-11-14 NOTE — Assessment & Plan Note (Signed)
She notes feeling like she is retaining fluid in her lungs.  It is difficult to assess over the phone.  I reviewed her last TTE and she has grade 1 diastolic dysfunction and mild valvular disease.  I think her symptoms can be explained by deconditioning and pulmonary issues at this time.  She will continue to take her lasix and spironolactone to help with fluid retention.   Plan Continue good blood pressure control, lasix

## 2020-11-14 NOTE — Assessment & Plan Note (Signed)
She has been taking melatonin, but this is slowly started to not work well for her.  I think she would benefit from CBT to help realign her sleep wake cycle.  Based on our discussion, she may be getting enough sleep during the day and has flipped her cycle.   Plan Will discuss with Dr. Theodis Shove about CBT for insomnia at next visit Continue melatonin

## 2020-11-14 NOTE — Assessment & Plan Note (Signed)
Currently she is rate controlled.  Based on Dr. Blenda Mounts last note, she had converted previously.  She is on Xarelto and diltiazem with good results.  Pulse reported today was 81.   Plan Continue diltiazem, xarelto.
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
## 2020-11-14 NOTE — Assessment & Plan Note (Signed)
This is being monitored with yearly imaging.  Dr. Oval Linsey in Cardiology is monitoring as well.  Currently she does not complain of chest pain or other symptoms concerning for worsening.    Plan Continue to monitor She is currently not on a statin, but should be encouraged to start one in the future.

## 2020-11-14 NOTE — Assessment & Plan Note (Signed)
BP today was relatively well controlled at 134/78.  Patient checked herself this morning.  She is having increased SOB, but I do not get the sense that she is volume overloaded.  She may benefit from pulmonary rehab to get strength back.    Plan Continue clonidine, diltiazem, lasix, spironolactone.

## 2020-11-14 NOTE — Assessment & Plan Note (Signed)
She notes side effects with baclofen.    Given her concomitant insomnia, will try cyclobenzaprine.  Advised her not to take them together.  Advised her to take for the first time at night to see how this effects her.

## 2020-11-15 ENCOUNTER — Telehealth: Payer: Self-pay

## 2020-11-15 ENCOUNTER — Telehealth: Payer: Self-pay | Admitting: *Deleted

## 2020-11-15 DIAGNOSIS — J449 Chronic obstructive pulmonary disease, unspecified: Secondary | ICD-10-CM

## 2020-11-15 MED ORDER — PROAIR HFA 108 (90 BASE) MCG/ACT IN AERS: 2.0000 | INHALATION_SPRAY | RESPIRATORY_TRACT | 3 refills | Status: DC | PRN

## 2020-11-15 NOTE — Telephone Encounter (Signed)
Return pt's call - needs a refill on ProAir (pharmacy told her no more refills) and needs a new nebulizer machine.(DME order). I will inform Mamie and Lauren about machine.

## 2020-11-15 NOTE — Telephone Encounter (Signed)
Please place DME order for nebulizer and write "and supplies" in comments.

## 2020-11-15 NOTE — Chronic Care Management (AMB) (Signed)
  Care Management   Note  11/15/2020 Name: [name] MRN: [id number] DOB: August 18, 1956  Shannon [name] is a 65 y.o. year old female who is a primary care patient of [name], MD and is actively engaged with the care management team. I reached out to [name] by phone today to assist with re-scheduling a follow up visit with the RN Case Manager  Follow up plan: Unsuccessful telephone outreach attempt made. A HIPAA compliant phone message was left for the patient providing contact information and requesting a return call. The care management team will reach out to the patient again over the next 7 days. If patient returns call to provider office, please advise to call [facility] at [phone].  [facility]

## 2020-11-15 NOTE — Telephone Encounter (Signed)
DME order placed.

## 2020-11-15 NOTE — Telephone Encounter (Signed)
Pls contact pt [phone]

## 2020-11-18 DIAGNOSIS — R911 Solitary pulmonary nodule: Secondary | ICD-10-CM | POA: Diagnosis not present

## 2020-11-18 DIAGNOSIS — J45909 Unspecified asthma, uncomplicated: Secondary | ICD-10-CM | POA: Diagnosis not present

## 2020-11-18 DIAGNOSIS — R269 Unspecified abnormalities of gait and mobility: Secondary | ICD-10-CM | POA: Diagnosis not present

## 2020-11-18 DIAGNOSIS — J449 Chronic obstructive pulmonary disease, unspecified: Secondary | ICD-10-CM | POA: Diagnosis not present

## 2020-11-19 ENCOUNTER — Telehealth: Payer: Self-pay | Admitting: *Deleted

## 2020-11-19 ENCOUNTER — Encounter: Payer: Self-pay | Admitting: Internal Medicine

## 2020-11-19 ENCOUNTER — Ambulatory Visit: Payer: Medicare Other | Admitting: *Deleted

## 2020-11-19 DIAGNOSIS — I5032 Chronic diastolic (congestive) heart failure: Secondary | ICD-10-CM

## 2020-11-19 DIAGNOSIS — B20 Human immunodeficiency virus [HIV] disease: Secondary | ICD-10-CM

## 2020-11-19 DIAGNOSIS — I483 Typical atrial flutter: Secondary | ICD-10-CM

## 2020-11-19 DIAGNOSIS — Z9981 Dependence on supplemental oxygen: Secondary | ICD-10-CM | POA: Insufficient documentation

## 2020-11-19 DIAGNOSIS — C3492 Malignant neoplasm of unspecified part of left bronchus or lung: Secondary | ICD-10-CM

## 2020-11-19 DIAGNOSIS — J449 Chronic obstructive pulmonary disease, unspecified: Secondary | ICD-10-CM

## 2020-11-19 DIAGNOSIS — N183 Chronic kidney disease, stage 3 unspecified: Secondary | ICD-10-CM

## 2020-11-19 DIAGNOSIS — J9611 Chronic respiratory failure with hypoxia: Secondary | ICD-10-CM

## 2020-11-19 DIAGNOSIS — J4489 Other specified chronic obstructive pulmonary disease: Secondary | ICD-10-CM

## 2020-11-19 NOTE — Telephone Encounter (Signed)
PCS FORM FAXED TO [facility] ([phone]) CONFIRMATION SHEET RECEIVED.

## 2020-11-19 NOTE — Patient Instructions (Signed)
Visit Information It was nice speaking with you today. Patient Care Plan: CCM RN- Cancer Treatment Phase (Adult)- patient diagnosed with lung cancer June 2021    Problem Identified: Patient lives alone with no family nearby with recent  diagnosis of lung cancer with numerous chronic disease states   Priority: High  Onset Date: 08/28/2020    Goal: Disease Progression Minimized or Managed   Start Date: 08/28/2020  Expected End Date: 11/21/2020  Recent Progress: On track  Priority: High  Note:   Current Barriers:  . Chronic Disease Management support and education needs related to [facility], CAD, HIV,  lung Ca diagnosed June 2021 . Unable to independently manage chronic disease states- patient called clinic today, 3/29, and asked to speak with this CCM RN, she states she was able to return to her condominium about 2 weeks ago and she is feeling much better, she states she was approved for 36 hours per month of personal care services and the services started yesterday, she says is very pleased with the home health aide provided by [facility] agency, she is requesting assist from the CCM team to see if she will qualify for more hours of care per month, she says she feels she is benefiting from her counseling sessions with Dr Theodis Shove, clinic counselor  Nurse Case Manager Clinical Goal(s):  [name] Over the next 30-60 days, patient will attend all scheduled medical appointments: related to chronic disease states . Over the next 30-60  days, the patient will demonstrate ongoing self health care management ability as evidenced by no unplanned hospital admissions   Interventions:  . 1:1 collaboration with [name], MD regarding development and update of comprehensive plan of care as evidenced by provider attestation and co-signature . Inter-disciplinary care team collaboration (see longitudinal plan of care) . Evaluation of current treatment plan related to chronic disease states and  patient's adherence to plan as established by provider. . Assessed medication taking behavior . Advised patient this CCM RN will request help from BSW ( via referral) to determine if she can qualify for more hours of personal care per month . Discussed plans with patient for ongoing care management follow up and provided patient with direct contact information for care management team . Reviewed scheduled/upcoming provider appointments including: with [facility] pulmonology on 11/21/20 and ensured she has transportation  Patient Goals/Self-Care Activities Over the next 30-60 days, patient will:  - Patient will self administer medications as prescribed Patient will attend all scheduled provider appointments Patient will call pharmacy for medication refills Patient will continue to perform ADL's independently Patient will continue to perform IADL's independently Patient will call provider office for new concerns or questions - eliminate COPD/Asthma symptom triggers at home - follow rescue plan if symptoms flare-up - keep follow-up appointments - use an extra pillow to sleep - arrange a ride through an agency 1 week before appointment - ask family or friend for a ride - call to cancel if needed - keep a calendar with appointment dates - eat healthy - get a least 8 hours of sleep at night - use devices that will help like a cane, sock-puller or reacher - use meditation or relaxation techniques - wash my hands with mild soap and water - wash my hands after using the bathroom - wash my hands after coughing, sneezing or blowing nose - stay away from people who are sick - begin personal counseling on 09/05/20 - talk about feelings with a friend, family or spiritual advisor -  practice positive thinking and self-talk  Follow Up Plan: The care management team will reach out to the patient again over the next 30-60 days.          The patient verbalized understanding of instructions,  educational materials, and care plan provided today and declined offer to receive copy of patient instructions, educational materials, and care plan.     Kelli Churn RN, CCM, [facility] RN Care Manager [phone]

## 2020-11-19 NOTE — Chronic Care Management (AMB) (Signed)
Care Management    RN Visit Note  11/19/2020 Name: Shannon [name] MRN: [id number] DOB: October 22, 1955  Subjective: Shannon [name] is a 65 y.o. year old female who is a primary care patient of Shannon Falcon, MD. The care management team was consulted for assistance with disease management and care coordination needs.    Engaged with patient by telephone for follow up visit in response to provider referral for case management and/or care coordination services.   Consent to Services:   Shannon [name] was given information about Care Management services today including:  1. Care Management services includes personalized support from designated clinical staff supervised by her physician, including individualized plan of care and coordination with other care providers 2. 24/7 contact phone numbers for assistance for urgent and routine care needs. 3. The patient may stop case management services at any time by phone call to the office staff.  Patient agreed to services and consent obtained.   Assessment: Review of patient past medical history, allergies, medications, health status, including review of consultants reports, laboratory and other test data, was performed as part of comprehensive evaluation and provision of chronic care management services.   SDOH (Social Determinants of Health) assessments and interventions performed:    Care Plan  Allergies  Allergen Reactions  . Tree Extract Swelling and Other (See Comments)    Swelling to eyes  . Augmentin [Amoxicillin-Pot Clavulanate] Other (See Comments)    Headache, dizzy  . Lisinopril Cough    Face/throat swelling  . Ciprofloxacin Hives    Outpatient Encounter Medications as of 11/19/2020  Medication Sig Note  . acetaminophen (TYLENOL) 325 MG tablet Take 325 mg by mouth every 6 (six) hours as needed (FOR PAIN).   [name] albuterol (PROVENTIL) (2.5 MG/3ML) 0.083% nebulizer solution Take 3 mLs (2.5 mg total) by nebulization every 6 (six) hours as  needed for wheezing or shortness of breath.   . Ascorbic Acid (VITAMIN C) 1000 MG tablet Take 1,000 mg by mouth every other day.    . b complex vitamins tablet Take 1 tablet by mouth daily.   [name] BIKTARVY 50-200-25 MG TABS tablet TAKE 1 TABLET BY MOUTH DAILY   . BIOTIN PO Take 1 tablet by mouth daily as needed (for supplementation).    . calcium carbonate (OSCAL) 1500 (600 Ca) MG TABS tablet Take 600 mg of elemental calcium by mouth daily with breakfast.   . Cholecalciferol (SM VITAMIN D3) 100 MCG (4000 UT) CAPS Take 1 capsule (4,000 Units total) by mouth daily.   . cloNIDine (CATAPRES) 0.1 MG tablet Take 1 tablet (0.1 mg total) by mouth every 12 (twelve) hours.   . cyanocobalamin (,VITAMIN B-12,) 1000 MCG/ML injection ADMINISTER 1 ML(1000 MCG) IN THE MUSCLE EVERY 30 DAYS (Patient taking differently: Inject 1,000 mcg into the muscle every 30 (thirty) days.)   . cyclobenzaprine (FLEXERIL) 5 MG tablet Take 1 tablet (5 mg total) by mouth 3 (three) times daily as needed for muscle spasms. Start taking at night to see how they affect you.   . diltiazem (CARDIZEM CD) 240 MG 24 hr capsule Take 1 capsule (240 mg total) by mouth daily.   . fluticasone furoate-vilanterol (BREO ELLIPTA) 200-25 MCG/INH AEPB Inhale 1 puff into the lungs daily.   . furosemide (LASIX) 20 MG tablet Take 20 mg by mouth every other day.   [name] HYDROcodone-acetaminophen (NORCO/VICODIN) 5-325 MG tablet Take 1 tablet by mouth 2 (two) times daily as needed for severe pain.   [name] MAGNESIUM  PO Take 1 tablet by mouth every other day.    . Melatonin 5 MG TABS Take 5 mg by mouth at bedtime as needed (for sleep).   . Misc. Devices (PULSE OXIMETER FOR FINGER) MISC 1 Units by Does not apply route as needed.   . Mouthwashes (BIOTENE DRY MOUTH GENTLE) LIQD Use as directed 1 Dose in the mouth or throat 2 (two) times daily as needed (dry mouth).   . Omega-3 Fatty Acids (FISH OIL) 1000 MG CAPS Take 1,000 mg by mouth daily as needed (pt prefrence).   [name]  omeprazole (PRILOSEC) 40 MG capsule Take 1 capsule (40 mg total) by mouth daily as needed (heart burn).   . OXYGEN Inhale 1.5-2 L into the lungs See admin instructions. Uses at bedtime and through the day as needed   . PROAIR HFA 108 (90 Base) MCG/ACT inhaler Inhale 2 puffs into the lungs every 4 (four) hours as needed for wheezing or shortness of breath.   . senna (SENOKOT) 8.6 MG tablet Take 1 tablet by mouth daily as needed for constipation.   [name] spironolactone (ALDACTONE) 25 MG tablet TAKE 1 TABLET(25 MG) BY MOUTH DAILY   . SYRINGE/NEEDLE, DISP, 1 ML (B-D SYRINGE/NEEDLE 1CC/25GX5/8) 25G X 5/8" 1 ML MISC 1 Units by Does not apply route every 30 (thirty) days.   . vitamin A 3 MG (10000 UNITS) capsule Take 10,000 Units by mouth daily.   Alveda Reasons 20 MG TABS tablet TAKE 1 TABLET(20 MG) BY MOUTH DAILY WITH SUPPER (Patient taking differently: Take 20 mg by mouth daily.) 08/20/2020: Unk time  . zinc gluconate 50 MG tablet Take 50 mg by mouth daily.    No facility-administered encounter medications on file as of 11/19/2020.    Patient Active Problem List   Diagnosis Date Noted  . On supplemental oxygen therapy 11/19/2020  . Insomnia 11/14/2020  . Burn erythema of breast, initial encounter 11/05/2020  . Acute back pain 09/17/2020  . Shoulder blade pain 08/09/2020  . Skin ulcer ([place]) 06/24/2020  . Bronchitis 03/07/2020  . Pulmonary nodule 02/14/2020  . Nodule of upper lobe of left lung 02/06/2020  . Thyroid nodule 01/26/2020  . Adenocarcinoma of lung ([place]) 01/04/2020  . Chronic obstructive asthma ([place]) 12/29/2019  . Myalgia 10/24/2019  . (HFpEF) heart failure with preserved ejection fraction ([place]) 02/07/2019  . Leg swelling 11/03/2018  . Other fatigue 06/30/2018  . Shortness of breath on exertion 06/30/2018  . Hyperglycemia 06/30/2018  . Vitamin D deficiency 06/30/2018  . Chronic anticoagulation 01/03/2018  . Chronic respiratory failure with hypoxia ([place]) 11/30/2017  . Constipation  11/03/2017  . Atrial flutter ([place]) 03/11/2017  . CAD (coronary artery disease) 01/28/2017  . Obesity (BMI 30-39.9) 01/13/2017  . Ascending aorta dilatation (H[facility]) 01/07/2017  . Low back pain radiating to right lower extremity 12/02/2016  . Acquired cyst of kidney 12/02/2016  . Chronic kidney disease (CKD), stage III (moderate) ([place]) 12/01/2016  . Generalized anxiety disorder 10/22/2016  . Hypersomnia 10/01/2016  . Accessory skin tags 06/10/2016  . Nocturnal hypoxemia 05/13/2016  . Cervical radiculopathy 04/14/2016  . GERD (gastroesophageal reflux disease) 01/02/2016  . Morbid (severe) obesity due to excess calories ([place]) 10/10/2015  . Vitamin B12 deficiency 10/02/2015  . Moderate persistent asthma with exacerbation 07/09/2015  . HIV disease ([place]) 07/09/2015  . Uncontrolled hypertension 07/09/2015    Conditions to be addressed/monitored: HTN, CAD, HLD, CKD, Asthma, COPD, lung ca 6/21, HIV, HF    Care Plan : CCM RN- Cancer Treatment Phase (  Adult)- patient diagnosed with lung cancer June 2021  Updates made by Barrington Ellison, RN since 11/19/2020 12:00 AM    Problem: Patient lives alone with no family nearby with recent  diagnosis of lung cancer with numerous chronic disease states   Priority: High  Onset Date: 08/28/2020    Goal: Disease Progression Minimized or Managed   Start Date: 08/28/2020  Expected End Date: 11/21/2020  Recent Progress: On track  Priority: High  Note:   Current Barriers:  . Chronic Disease Management support and education needs related to [facility], CAD, HIV,  lung Ca diagnosed June 2021 . Unable to independently manage chronic disease states- patient called clinic today, 3/29, and asked to speak with this CCM RN, she states she was able to return to her condominium about 2 weeks ago and she is feeling much better, she states she was approved for 36 hours per month of personal care services and the services started yesterday, she says is very  pleased with the home health aide provided by [facility] agency, she is requesting assist from the CCM team to see if she will qualify for more hours of care per month, she says she feels she is benefiting from her counseling sessions with Dr Theodis Shove, clinic counselor  Nurse Case Manager Clinical Goal(s):  [name] Over the next 30-60 days, patient will attend all scheduled medical appointments: related to chronic disease states . Over the next 30-60  days, the patient will demonstrate ongoing self health care management ability as evidenced by no unplanned hospital admissions   Interventions:  . 1:1 collaboration with Shannon Falcon, MD regarding development and update of comprehensive plan of care as evidenced by provider attestation and co-signature . Inter-disciplinary care team collaboration (see longitudinal plan of care) . Evaluation of current treatment plan related to chronic disease states and patient's adherence to plan as established by provider. . Assessed medication taking behavior . Advised patient this CCM RN will request help from BSW ( via referral) to determine if she can qualify for more hours of personal care per month . Discussed plans with patient for ongoing care management follow up and provided patient with direct contact information for care management team . Reviewed scheduled/upcoming provider appointments including: with [facility] pulmonology on 11/21/20 and ensured she has transportation  Patient Goals/Self-Care Activities Over the next 30-60 days, patient will:  - Patient will self administer medications as prescribed Patient will attend all scheduled provider appointments Patient will call pharmacy for medication refills Patient will continue to perform ADL's independently Patient will continue to perform IADL's independently Patient will call provider office for new concerns or questions - eliminate COPD/Asthma symptom triggers at home - follow rescue plan if  symptoms flare-up - keep follow-up appointments - use an extra pillow to sleep - arrange a ride through an agency 1 week before appointment - ask family or friend for a ride - call to cancel if needed - keep a calendar with appointment dates - eat healthy - get a least 8 hours of sleep at night - use devices that will help like a cane, sock-puller or reacher - use meditation or relaxation techniques - wash my hands with mild soap and water - wash my hands after using the bathroom - wash my hands after coughing, sneezing or blowing nose - stay away from people who are sick - begin personal counseling on 09/05/20 - talk about feelings with a friend, family or spiritual advisor - practice positive thinking and  self-talk  Follow Up Plan: The care management team will reach out to the patient again over the next 30-60 days.           Kelli Churn RN, CCM, [facility] RN Care Manager [phone]

## 2020-11-20 ENCOUNTER — Ambulatory Visit: Payer: Self-pay

## 2020-11-20 DIAGNOSIS — I5032 Chronic diastolic (congestive) heart failure: Secondary | ICD-10-CM

## 2020-11-20 DIAGNOSIS — B20 Human immunodeficiency virus [HIV] disease: Secondary | ICD-10-CM

## 2020-11-20 DIAGNOSIS — J449 Chronic obstructive pulmonary disease, unspecified: Secondary | ICD-10-CM

## 2020-11-20 NOTE — Chronic Care Management (AMB) (Signed)
Chronic Care Management    Social Work Note  11/20/2020 Name: Shannon [name] MRN: [id number] DOB: 22-Feb-1956  Shannon [name] is a 65 y.o. year old female who is a primary care patient of [name], MD. The CCM team was consulted to assist the patient with chronic disease management and/or care coordination needs related to: Level of Care Concerns.   Engaged with patient Collaboration with [employer] for initial visit in response to provider referral for social work chronic care management and care coordination services.   Consent to Services:  Patient was given the following information about Chronic Care Management services today, agreed to services, and gave verbal consent: 1. CCM service includes personalized support from designated clinical staff supervised by primary care provider, including individualized plan of care and coordination with other care providers 2. 24/7 contact phone numbers for assistance for urgent and routine care needs. 3. Service will only be billed when office clinical staff spend 20 minutes or more in a month to coordinate care. 4. Only one practitioner may furnish and bill the service in a calendar month. 5.The patient may stop CCM services at any time (effective at the end of the month) by phone call to the office staff. 6. The patient will be responsible for cost sharing (co-pay) of up to 20% of the service fee (after annual deductible is met). Patient agreed to services and consent obtained.  Patient agreed to services and consent obtained.   Assessment/Interventions: Review of patient past medical history, allergies, medications, and health status, including review of relevant consultants reports was performed today as part of a comprehensive evaluation and provision of chronic care management and care coordination services.     SDOH (Social Determinants of Health) assessments and interventions performed:    BSW receieved a referral to assist  patient with increasing PCS hours. BSW contacted [employer] and spoke with [place], he stated since patient just started receiving services she can complete a reconsideration request and send it back to [place].  BSW contacted patient to inform her of this information but was unsuccessful. BSW will collaborated with RN Kelli Churn to have form sent to patient.  Advanced Directives Status: Not addressed in this encounter.  CCM Care Plan  Allergies  Allergen Reactions  . Tree Extract Swelling and Other (See Comments)    Swelling to eyes  . Augmentin [Amoxicillin-Pot Clavulanate] Other (See Comments)    Headache, dizzy  . Lisinopril Cough    Face/throat swelling  . Ciprofloxacin Hives    @[email or web address] @  Patient Active Problem List   Diagnosis Date Noted  . On supplemental oxygen therapy 11/19/2020  . Insomnia 11/14/2020  . Burn erythema of breast, initial encounter 11/05/2020  . Acute back pain 09/17/2020  . Shoulder blade pain 08/09/2020  . Skin ulcer ([place]) 06/24/2020  . Bronchitis 03/07/2020  . Pulmonary nodule 02/14/2020  . Nodule of upper lobe of left lung 02/06/2020  . Thyroid nodule 01/26/2020  . Adenocarcinoma of lung ([place]) 01/04/2020  . Chronic obstructive asthma ([place]) 12/29/2019  . Myalgia 10/24/2019  . (HFpEF) heart failure with preserved ejection fraction ([place]) 02/07/2019  . Leg swelling 11/03/2018  . Other fatigue 06/30/2018  . Shortness of breath on exertion 06/30/2018  . Hyperglycemia 06/30/2018  . Vitamin D deficiency 06/30/2018  . Chronic anticoagulation 01/03/2018  . Chronic respiratory failure with hypoxia ([place]) 11/30/2017  . Constipation 11/03/2017  . Atrial flutter ([place]) 03/11/2017  . CAD (coronary artery disease) 01/28/2017  . Obesity (  BMI 30-39.9) 01/13/2017  . Ascending aorta dilatation (HCC) 01/07/2017  . Low back pain radiating to right lower extremity 12/02/2016  . Acquired cyst of kidney 12/02/2016  . Chronic kidney disease  (CKD), stage III (moderate) ([place]) 12/01/2016  . Generalized anxiety disorder 10/22/2016  . Hypersomnia 10/01/2016  . Accessory skin tags 06/10/2016  . Nocturnal hypoxemia 05/13/2016  . Cervical radiculopathy 04/14/2016  . GERD (gastroesophageal reflux disease) 01/02/2016  . Morbid (severe) obesity due to excess calories ([place]) 10/10/2015  . Vitamin B12 deficiency 10/02/2015  . Moderate persistent asthma with exacerbation 07/09/2015  . HIV disease ([place]) 07/09/2015  . Uncontrolled hypertension 07/09/2015    Conditions to be addressed/monitored per PCP order: PCs; Level of care concerns  Patient Care Plan: CCM RN- Cancer Treatment Phase (Adult)- patient diagnosed with lung cancer June 2021    Problem Identified: Patient lives alone with no family nearby with recent  diagnosis of lung cancer with numerous chronic disease states   Priority: High  Onset Date: 08/28/2020    Goal: Disease Progression Minimized or Managed   Start Date: 08/28/2020  Expected End Date: 11/21/2020  Recent Progress: On track  Priority: High  Note:   Current Barriers:  . Chronic Disease Management support and education needs related to [facility], CAD, HIV,  lung Ca diagnosed June 2021 . Unable to independently manage chronic disease states- patient called clinic today, 3/29, and asked to speak with this CCM RN, she states she was able to return to her condominium about 2 weeks ago and she is feeling much better, she states she was approved for 36 hours per month of personal care services and the services started yesterday, she says is very pleased with the home health aide provided by [facility] agency, she is requesting assist from the CCM team to see if she will qualify for more hours of care per month, she says she feels she is benefiting from her counseling sessions with Dr Theodis Shove, clinic counselor  Nurse Case Manager Clinical Goal(s):  [name] Over the next 30-60 days, patient will attend all  scheduled medical appointments: related to chronic disease states . Over the next 30-60  days, the patient will demonstrate ongoing self health care management ability as evidenced by no unplanned hospital admissions   Interventions:  . 1:1 collaboration with [name], MD regarding development and update of comprehensive plan of care as evidenced by provider attestation and co-signature . Inter-disciplinary care team collaboration (see longitudinal plan of care) . Evaluation of current treatment plan related to chronic disease states and patient's adherence to plan as established by provider. . Assessed medication taking behavior . Advised patient this CCM RN will request help from BSW ( via referral) to determine if she can qualify for more hours of personal care per month . Discussed plans with patient for ongoing care management follow up and provided patient with direct contact information for care management team . Reviewed scheduled/upcoming provider appointments including: with [facility] pulmonology on 11/21/20 and ensured she has transportation  Patient Goals/Self-Care Activities Over the next 30-60 days, patient will:  - Patient will self administer medications as prescribed Patient will attend all scheduled provider appointments Patient will call pharmacy for medication refills Patient will continue to perform ADL's independently Patient will continue to perform IADL's independently Patient will call provider office for new concerns or questions - eliminate COPD/Asthma symptom triggers at home - follow rescue plan if symptoms flare-up - keep follow-up appointments - use an extra pillow to  sleep - arrange a ride through an agency 1 week before appointment - ask family or friend for a ride - call to cancel if needed - keep a calendar with appointment dates - eat healthy - get a least 8 hours of sleep at night - use devices that will help like a cane, sock-puller or  reacher - use meditation or relaxation techniques - wash my hands with mild soap and water - wash my hands after using the bathroom - wash my hands after coughing, sneezing or blowing nose - stay away from people who are sick - begin personal counseling on 09/05/20 - talk about feelings with a friend, family or spiritual advisor - practice positive thinking and self-talk  Follow Up Plan: The care management team will reach out to the patient again over the next 30-60 days.           Follow Up Plan: SW will follow up with patient by phone over the next 5 days      Mickel Fuchs, [place], [place]

## 2020-11-21 ENCOUNTER — Ambulatory Visit: Payer: Medicare Other | Admitting: Adult Health

## 2020-11-21 ENCOUNTER — Ambulatory Visit: Payer: Medicare Other | Admitting: Physical Therapy

## 2020-11-21 NOTE — Progress Notes (Signed)
Patient called Shannon [name] back RNCM rescheduled for 11/19/2020.  [place] [facility]

## 2020-11-22 ENCOUNTER — Ambulatory Visit: Payer: Medicare Other | Admitting: *Deleted

## 2020-11-22 ENCOUNTER — Ambulatory Visit: Payer: Medicare Other | Admitting: Physical Therapy

## 2020-11-22 DIAGNOSIS — J9611 Chronic respiratory failure with hypoxia: Secondary | ICD-10-CM

## 2020-11-22 DIAGNOSIS — I5032 Chronic diastolic (congestive) heart failure: Secondary | ICD-10-CM

## 2020-11-22 DIAGNOSIS — C3492 Malignant neoplasm of unspecified part of left bronchus or lung: Secondary | ICD-10-CM

## 2020-11-22 DIAGNOSIS — N183 Chronic kidney disease, stage 3 unspecified: Secondary | ICD-10-CM

## 2020-11-22 DIAGNOSIS — B20 Human immunodeficiency virus [HIV] disease: Secondary | ICD-10-CM

## 2020-11-22 DIAGNOSIS — J449 Chronic obstructive pulmonary disease, unspecified: Secondary | ICD-10-CM

## 2020-11-22 DIAGNOSIS — I483 Typical atrial flutter: Secondary | ICD-10-CM

## 2020-11-22 NOTE — Progress Notes (Signed)
Internal Medicine Clinic Attending  Case discussed with Dr. Shon Baton  At the time of the visit.  We reviewed the resident's history and exam and pertinent patient test results.  I agree with the assessment, diagnosis, and plan of care documented in the resident's note. Visit was labeled erroneously as postcoital mucous check.  It was a telehealth visit for burn.

## 2020-11-22 NOTE — Chronic Care Management (AMB) (Signed)
Care Management    RN Visit Note  11/22/2020 Name: Shannon [name] MRN: [id number] DOB: February 06, 1956  Subjective: Shannon [name] is a 65 y.o. year old female who is a primary care patient of Shannon Falcon, MD. The care management team was consulted for assistance with disease management and care coordination needs.    Consent to Services:   Shannon [name] was given information about Care Management services today including:  1. Care Management services includes personalized support from designated clinical staff supervised by her physician, including individualized plan of care and coordination with other care providers 2. 24/7 contact phone numbers for assistance for urgent and routine care needs. 3. The patient may stop case management services at any time by phone call to the office staff.  Patient agreed to services and consent obtained.   Assessment: Review of patient past medical history, allergies, medications, health status, including review of consultants reports, laboratory and other test data, was performed as part of comprehensive evaluation and provision of chronic care management services.   SDOH (Social Determinants of Health) assessments and interventions performed:    Care Plan  Allergies  Allergen Reactions  . Tree Extract Swelling and Other (See Comments)    Swelling to eyes  . Augmentin [Amoxicillin-Pot Clavulanate] Other (See Comments)    Headache, dizzy  . Lisinopril Cough    Face/throat swelling  . Ciprofloxacin Hives    Outpatient Encounter Medications as of 11/22/2020  Medication Sig Note  . acetaminophen (TYLENOL) 325 MG tablet Take 325 mg by mouth every 6 (six) hours as needed (FOR PAIN).   [name] albuterol (PROVENTIL) (2.5 MG/3ML) 0.083% nebulizer solution Take 3 mLs (2.5 mg total) by nebulization every 6 (six) hours as needed for wheezing or shortness of breath.   . Ascorbic Acid (VITAMIN C) 1000 MG tablet Take 1,000 mg by mouth every other day.    . b complex  vitamins tablet Take 1 tablet by mouth daily.   [name] BIKTARVY 50-200-25 MG TABS tablet TAKE 1 TABLET BY MOUTH DAILY   . BIOTIN PO Take 1 tablet by mouth daily as needed (for supplementation).    . calcium carbonate (OSCAL) 1500 (600 Ca) MG TABS tablet Take 600 mg of elemental calcium by mouth daily with breakfast.   . Cholecalciferol (SM VITAMIN D3) 100 MCG (4000 UT) CAPS Take 1 capsule (4,000 Units total) by mouth daily.   . cloNIDine (CATAPRES) 0.1 MG tablet Take 1 tablet (0.1 mg total) by mouth every 12 (twelve) hours.   . cyanocobalamin (,VITAMIN B-12,) 1000 MCG/ML injection ADMINISTER 1 ML(1000 MCG) IN THE MUSCLE EVERY 30 DAYS (Patient taking differently: Inject 1,000 mcg into the muscle every 30 (thirty) days.)   . cyclobenzaprine (FLEXERIL) 5 MG tablet Take 1 tablet (5 mg total) by mouth 3 (three) times daily as needed for muscle spasms. Start taking at night to see how they affect you.   . diltiazem (CARDIZEM CD) 240 MG 24 hr capsule Take 1 capsule (240 mg total) by mouth daily.   . fluticasone furoate-vilanterol (BREO ELLIPTA) 200-25 MCG/INH AEPB Inhale 1 puff into the lungs daily.   . furosemide (LASIX) 20 MG tablet Take 20 mg by mouth every other day.   [name] HYDROcodone-acetaminophen (NORCO/VICODIN) 5-325 MG tablet Take 1 tablet by mouth 2 (two) times daily as needed for severe pain.   [name] MAGNESIUM PO Take 1 tablet by mouth every other day.    . Melatonin 5 MG TABS Take 5 mg by mouth at  bedtime as needed (for sleep).   . Misc. Devices (PULSE OXIMETER FOR FINGER) MISC 1 Units by Does not apply route as needed.   . Mouthwashes (BIOTENE DRY MOUTH GENTLE) LIQD Use as directed 1 Dose in the mouth or throat 2 (two) times daily as needed (dry mouth).   . Omega-3 Fatty Acids (FISH OIL) 1000 MG CAPS Take 1,000 mg by mouth daily as needed (pt prefrence).   [name] omeprazole (PRILOSEC) 40 MG capsule Take 1 capsule (40 mg total) by mouth daily as needed (heart burn).   . OXYGEN Inhale 1.5-2 L into the lungs See  admin instructions. Uses at bedtime and through the day as needed   . PROAIR HFA 108 (90 Base) MCG/ACT inhaler Inhale 2 puffs into the lungs every 4 (four) hours as needed for wheezing or shortness of breath.   . senna (SENOKOT) 8.6 MG tablet Take 1 tablet by mouth daily as needed for constipation.   [name] spironolactone (ALDACTONE) 25 MG tablet TAKE 1 TABLET(25 MG) BY MOUTH DAILY   . SYRINGE/NEEDLE, DISP, 1 ML (B-D SYRINGE/NEEDLE 1CC/25GX5/8) 25G X 5/8" 1 ML MISC 1 Units by Does not apply route every 30 (thirty) days.   . vitamin A 3 MG (10000 UNITS) capsule Take 10,000 Units by mouth daily.   Shannon [name] 20 MG TABS tablet TAKE 1 TABLET(20 MG) BY MOUTH DAILY WITH SUPPER (Patient taking differently: Take 20 mg by mouth daily.) 08/20/2020: Unk time  . zinc gluconate 50 MG tablet Take 50 mg by mouth daily.    No facility-administered encounter medications on file as of 11/22/2020.    Patient Active Problem List   Diagnosis Date Noted  . On supplemental oxygen therapy 11/19/2020  . Insomnia 11/14/2020  . Burn erythema of breast, initial encounter 11/05/2020  . Acute back pain 09/17/2020  . Shoulder blade pain 08/09/2020  . Skin ulcer ([place]) 06/24/2020  . Bronchitis 03/07/2020  . Pulmonary nodule 02/14/2020  . Nodule of upper lobe of left lung 02/06/2020  . Thyroid nodule 01/26/2020  . Adenocarcinoma of lung ([place]) 01/04/2020  . Chronic obstructive asthma ([place]) 12/29/2019  . Myalgia 10/24/2019  . (HFpEF) heart failure with preserved ejection fraction ([place]) 02/07/2019  . Leg swelling 11/03/2018  . Other fatigue 06/30/2018  . Shortness of breath on exertion 06/30/2018  . Hyperglycemia 06/30/2018  . Vitamin D deficiency 06/30/2018  . Chronic anticoagulation 01/03/2018  . Chronic respiratory failure with hypoxia ([place]) 11/30/2017  . Constipation 11/03/2017  . Atrial flutter ([place]) 03/11/2017  . CAD (coronary artery disease) 01/28/2017  . Obesity (BMI 30-39.9) 01/13/2017  . Ascending aorta  dilatation (HCC) 01/07/2017  . Low back pain radiating to right lower extremity 12/02/2016  . Acquired cyst of kidney 12/02/2016  . Chronic kidney disease (CKD), stage III (moderate) ([place]) 12/01/2016  . Generalized anxiety disorder 10/22/2016  . Hypersomnia 10/01/2016  . Accessory skin tags 06/10/2016  . Nocturnal hypoxemia 05/13/2016  . Cervical radiculopathy 04/14/2016  . GERD (gastroesophageal reflux disease) 01/02/2016  . Morbid (severe) obesity due to excess calories ([place]) 10/10/2015  . Vitamin B12 deficiency 10/02/2015  . Moderate persistent asthma with exacerbation 07/09/2015  . HIV disease ([place]) 07/09/2015  . Uncontrolled hypertension 07/09/2015    Conditions to be addressed/monitored: HTN, CAD, HLD, CKD, Asthma, COPD, lung ca 6/21, HIV, HF  Care Plan : CCM RN- Cancer Treatment Phase (Adult)- patient diagnosed with lung cancer June 2021  Updates made by Barrington Ellison, RN since 11/22/2020 12:00 AM    Problem: Patient  lives alone with no family nearby with recent  diagnosis of lung cancer with numerous chronic disease states   Priority: High  Onset Date: 08/28/2020    Goal: Disease Progression Minimized or Managed   Start Date: 08/28/2020  Expected End Date: 11/21/2020  Recent Progress: On track  Priority: High  Note:   Current Barriers:  . Chronic Disease Management support and education needs related to [facility], CAD, HIV,  lung Ca diagnosed June 2021 . Unable to independently manage chronic disease states- patient called clinic today, 3/29, and asked to speak with this CCM RN, she states she was able to return to her condominium about 2 weeks ago and she is feeling much better, she states she was approved for 36 hours per month of personal care services and the services started yesterday, she says is very pleased with the home health aide provided by [facility] agency, she is requesting assist from the CCM team to see if she will qualify for more hours of  care per month, she says she feels she is benefiting from her counseling sessions with Dr Theodis Shove, clinic counselor  Nurse Case Manager Clinical Goal(s):  [name] Over the next 30-60 days, patient will attend all scheduled medical appointments: related to chronic disease states . Over the next 30-60  days, the patient will demonstrate ongoing self health care management ability as evidenced by no unplanned hospital admissions   Interventions:  . 1:1 collaboration with Shannon Falcon, MD regarding development and update of comprehensive plan of care as evidenced by provider attestation and co-signature . Inter-disciplinary care team collaboration (see longitudinal plan of care) . Evaluation of current treatment plan related to chronic disease states and patient's adherence to plan as established by provider. . Assessed medication taking behavior . Advised patient this CCM RN will request help from BSW ( via referral) to determine if she can qualify for more hours of personal care per month . Discussed plans with patient for ongoing care management follow up and provided patient with direct contact information for care management team . Reviewed scheduled/upcoming provider appointments including: with [facility] pulmonology on 11/21/20 and ensured she has transportation . 11/22/20- Request for Reconsideration of PCS Authorization [place] Medicaid form # 3114 and patient letter of explanation emailed to [place] [facility] , [place] Management assistant ,by community care guide Audelia Hives , to be mailed to patient's home address.  Patient Goals/Self-Care Activities Over the next 30-60 days, patient will:  - Patient will self administer medications as prescribed Patient will attend all scheduled provider appointments Patient will call pharmacy for medication refills Patient will continue to perform ADL's independently Patient will continue to perform IADL's independently Patient will call  provider office for new concerns or questions - eliminate COPD/Asthma symptom triggers at home - follow rescue plan if symptoms flare-up - keep follow-up appointments - use an extra pillow to sleep - arrange a ride through an agency 1 week before appointment - ask family or friend for a ride - call to cancel if needed - keep a calendar with appointment dates - eat healthy - get a least 8 hours of sleep at night - use devices that will help like a cane, sock-puller or reacher - use meditation or relaxation techniques - wash my hands with mild soap and water - wash my hands after using the bathroom - wash my hands after coughing, sneezing or blowing nose - stay away from people who are sick - begin personal counseling  on 09/05/20 - talk about feelings with a friend, family or spiritual advisor - practice positive thinking and self-talk  Follow Up Plan: The care management team will reach out to the patient again over the next 30-60 days.          Kelli Churn RN, CCM, [facility] RN Care Manager [phone]

## 2020-11-26 NOTE — Progress Notes (Deleted)
Cardiology Office Note:    Date:  11/26/2020   ID:  Shannon [name], DOB [birth year], MRN [id number]  PCP:  [name], MD  Cardiologist:  Skeet Latch, MD  Electrophysiologist:  None   Referring MD: [name], MD   Chief Complaint: follow-up of hypertension  History of Present Illness:    Shannon [name] is a 65 y.o. female with a history of non-obstructive CAD on cardiac catheterization in 2013 in [place] with low risk Myoview in 4680, chronic diastolic CHF, paroxysmal atrial fibrillation/flutter on Xarelto, PAC/PVC noted on Holter Monitor in 2015, PAD with ABIs in 02/2019 showing moderate right lower extremity disease and mild left lower extremity disease, mild ascending aorta aneurysm, aortic stenosis/regurgitation, hypertension, hyperlipidemia, obstructive sleep apnea, prior PE in 2016, COPD, moderate pulmonary hypertension, HIV, and stage IA non-small cell lung cancer s/p radiation who is followed by Dr. Oval Linsey and presents today for follow-up of hypertension.  Last ischemic evaluation was a Myoview in 01/2016 which was low risk with no evidence of ischemia. She had lower extremity dopplers and ABIs in 02/2019 for further evaluation of claudication. ABIs showed moderate right lower extremity disease and mild left lower extremity disease. She was referred to Vascular Surgery but was preferred to follow with Dr. Oval Linsey. Most recent Echo in 10/2019 showed LVEF of 60-65% with normal wall motion, grade 1 diastolic dysfunction, and trivial AI (previously considered to be mild to moderate on prior study in 2020).   Patient has history of hypertension and struggles with medication adherence. She reportedly has strong feelings about what medicines she should take and when she should take them. She [facility] past and received a lot of help there but stopped going during [place]. More recently patient was diagnosed with stage 1 non-small  cell lung cancer. She was not felt to be a surgical candidate and completed 5 session of radiation. She was last seen by Dr. Oval Linsey in 08/2020 for a virtual visit at which time she had recently been diagnosed with pneumonia and was being treated with antibiotics and steroids but she was doing well from a cardiac standpoint. Her BP was elevated on that day but patient reported her BP had generally been well controlled and she had not taken her medications that day. No changes were made and she was advised to follow-up in 3 months.  Patient presents today for follow-up. ***  Hypertension -*** - Continue Cardizem 240mg  daily, Spironolactone 25mg  daily, and Clonidine 0.1mg  twice daily.  Non-Obstructive CAD - History of mild non-obstructive CAD noted on cardiac cath in 2013 in [place]. Myoview in 2017 was low risk with no ischemia.  - No angina. - No Aspirin due to need for DOAC. - Patient has not wanted to start statin in the past.  Chronic Diastolic CHF - Most recent Echo in  10/2019 showed LVEF of 60-65% with normal wall motion and grade 1 diastolic dysfunction. - Appears euvolemic on exam. *** - Continue Lasix 20mg  every other day.  Paroxysmal Atrial Fibrillation/Flutter - Previously converted back to sinus rhythm on Flecainide. No longer on this. - Continue Cardizem 240mg  daily. - Continue Xarelto 20mg  daily.  Hyperlipidemia - Most recent lipid panel from 10/2019: Total Cholesterol 249, Triglycerides 53, HDL 70, LDL 164.  - Patient has not been ready to start medications and has been focusing on diet modification. - Will repeat fasting lipid panel. Recent CMET in 09/2020 showed minimally elevated ALT but  otherwise LFTs were normal.  Aortic Regurgitation/Stenosis - Previously noted to have mild to moderate AI as well as mild AS on Echo in 2020. - Most recent Echo in 10/2019 showed only trivial AI and mild aortic valve sclerosis but no AS.  PAD - Dopplers/ABIs in 02/2019 showed moderate  right lower extremity disease and mild left lower extremity disease. Patient was referred to Vascular Specialist but preferred to follow with Dr. Oval Linsey. - *** - No aspirin due to DOAC. - Lipid control as above.  Ascending Aorta Aneurysm - Measured 4.0cm on CT in 01/2017. Unchanged on MRA in 10/2018.  - Stable measuring 4.2cm on recent chest CT on 07/2020.     Past Medical History:  Diagnosis Date  . Anemia   . Anxiety    HX PANIC ATTACKS  . Arthritis    "starting to; in my hands" (07/09/2015)  . Asthma   . Atrial fibrillation ([place])   . Atrial flutter, paroxysmal ([place])   . Bloated abdomen   . CFS (chronic fatigue syndrome)   . Chewing difficulty   . Chronic asthma with acute exacerbation    "I have chronic asthma all the time; sometimes exacerbations" (07/09/2015)  . Chronic lower back pain   . COPD (chronic obstructive pulmonary disease) ([place])   . Cyst of right kidney    "3 of them; dx'd in ~ 01/2015"  . Dyspnea   . GERD (gastroesophageal reflux disease)   . Heart murmur   . History of blood transfusion    "related to my brain surgery I think"  . History of pulmonary embolism 07/09/2015  . HIV antibody positive ([place])   . HIV disease ([place])   . Hyperlipidemia   . Hypertension   . Leg edema   . Lipodystrophy   . Mild CAD 2013  . Multiple thyroid nodules   . Osteopenia   . Palpitations   . Pneumonia 07/09/2015  . Shingles   . Sleep apnea    "never completed part 2 of study; never wore mask" (07/09/2015)  . Vitamin B 12 deficiency   . Vitamin D deficiency     Past Surgical History:  Procedure Laterality Date  . ABDOMINAL HYSTERECTOMY     "robotic laparosopic"  . BRAIN SURGERY  1974   "brain tumor; benign; on top of my brain; got a plate in there"  . BRAIN SURGERY     age 62- -"Tumor pushing my skullout"  . BRONCHIAL BIOPSY  02/13/2020   Procedure: BRONCHIAL BIOPSIES;  Surgeon: [name], MD;  Location[facility] ENDOSCOPY;  Service: Pulmonary;;  . BRONCHIAL  BRUSHINGS  02/13/2020   Procedure: BRONCHIAL BRUSHINGS;  Surgeon: [name], MD;  Location: [facility] ENDOSCOPY;  Service: Pulmonary;;  . BRONCHIAL NEEDLE ASPIRATION BIOPSY  02/13/2020   Procedure: BRONCHIAL NEEDLE ASPIRATION BIOPSIES;  Surgeon: [name], MD;  Location: [facility] ENDOSCOPY;  Service: Pulmonary;;  . CARDIAC CATHETERIZATION    . FIDUCIAL MARKER PLACEMENT  02/13/2020   Procedure: FIDUCIAL MARKER PLACEMENT;  Surgeon: [name], MD;  Location[facility] ENDOSCOPY;  Service: Pulmonary;;  . TONSILLECTOMY AND ADENOIDECTOMY    . VIDEO BRONCHOSCOPY WITH ENDOBRONCHIAL NAVIGATION N/A 02/13/2020   Procedure: VIDEO BRONCHOSCOPY WITH ENDOBRONCHIAL NAVIGATION;  Surgeon: [name], MD;  Location: [facility] ENDOSCOPY;  Service: Pulmonary;  Laterality: N/A;    Current Medications: No outpatient medications have been marked as taking for the 12/03/20 encounter (Appointment) with Darreld Mclean, PA-C.     Allergies:   Tree extract, Augmentin [amoxicillin-pot  clavulanate], Lisinopril, and Ciprofloxacin   Social History   Socioeconomic History  . Marital status: Single    Spouse name: Not on file  . Number of children: Not on file  . Years of education: Not on file  . Highest education level: Not on file  Occupational History  . Occupation: Ship broker  Tobacco Use  . Smoking status: Never Smoker  . Smokeless tobacco: Never Used  Vaping Use  . Vaping Use: Never used  Substance and Sexual Activity  . Alcohol use: Not Currently    Alcohol/week: 0.0 standard drinks    Comment: Rarely.  . Drug use: No  . Sexual activity: Not Currently    Partners: Male    Comment: declined condoms  Other Topics Concern  . Not on file  Social History Narrative   Originally from [place] then [place] now [place] 2016 approx   Disabled    Rare alcohol, never smoker, no drug use            Social Determinants of Radio broadcast assistant Strain: Not on file  Food Insecurity: Not on file  Transportation Needs: No  Transportation Needs  . Lack of Transportation (Medical): No  . Lack of Transportation (Non-Medical): No  Physical Activity: Inactive  . Days of Exercise per Week: 0 days  . Minutes of Exercise per Session: 0 min  Stress: Not on file  Social Connections: Not on file     Family History: The patient's family history includes Asthma in her mother; Diabetes in her sister; Heart disease in her mother; Heart failure in her mother; Heart murmur in her brother, sister, and sister; Hypertension in her mother; Obesity in her mother; Thyroid disease in her mother and sister.  ROS:   Please see the history of present illness.     EKGs/Labs/Other Studies Reviewed:    The following studies were reviewed today:  Myoview 01/21/2016:  The left ventricular ejection fraction is normal (55-65%). The EF is 60% visually. The computer generated EF is not calculated correctly and is therefore not reported.  There was no ST segment deviation noted during stress.  The study is normal. no ischemia . no infarction _______________  Lower Extremity Arterial Ultrasound with ABIs 03/09/2019: Summary:  - Right: Near normal examination. No hemodynamically significant arterial  stenosis/occlusion noted in the CFA, DFA, SFA, popliteal artery and TPT.  - Tibial vessel disease; zero vessel run-off.  - Left: Near normal examination. No hemodynamically significant arterial  stenosis/occlusion noted in the CFA, DFA, SFA, popliteal artery and TPT.  - Tibial vessel disease; one vessel run-off via peroneal artery.   - Right: Resting right ankle-brachial index indicates moderate right lower  extremity arterial disease. The right toe-brachial index is abnormal.  - Left: Resting left ankle-brachial index indicates mild left lower  extremity arterial disease. The left toe-brachial index is abnormal.  _______________  Echocardiogram 11/15/2019: Impressions: 1. Left ventricular ejection fraction, by estimation, is 60 to  65%. The  left ventricle has normal function. The left ventricle has no regional  wall motion abnormalities. There is mild left ventricular hypertrophy.  Left ventricular diastolic parameters  are consistent with Grade I diastolic dysfunction (impaired relaxation).  2. Right ventricular systolic function is normal. The right ventricular  size is normal. There is normal pulmonary artery systolic pressure. The  estimated right ventricular systolic pressure is 40.9 mmHg.  3. The mitral valve is degenerative. No evidence of mitral valve  regurgitation. No evidence of mitral stenosis. There is turbulence  across  the mitral valve but it does not appear restricted and there is no  evidence for a significant pressure gradient  across it.  4. The aortic valve is tricuspid. Aortic valve regurgitation is trivial.  Mild aortic valve sclerosis is present, with no evidence of aortic valve  stenosis.  5. The inferior vena cava is normal in size with greater than 50%  respiratory variability, suggesting right atrial pressure of 3 mmHg.    EKG:  EKG not ordered today. EKG personally reviewed and demonstrates ***.  Recent Labs: 02/15/2020: B Natriuretic Peptide 73.6 02/17/2020: Magnesium 2.0 10/04/2020: ALT 40; BUN 20; Creat 1.50; Hemoglobin 11.6; Platelets 216; Potassium 4.3; Sodium 141  Recent Lipid Panel    Component Value Date/Time   CHOL 249 (H) 11/10/2019 1055   CHOL 253 (H) 05/03/2018 1043   TRIG 53 11/10/2019 1055   HDL 70 11/10/2019 1055   HDL 86 05/03/2018 1043   CHOLHDL 3.6 11/10/2019 1055   VLDL 18 11/26/2016 1618   LDLCALC 164 (H) 11/10/2019 1055    Physical Exam:    Vital Signs: There were no vitals taken for this visit.    Wt Readings from Last 3 Encounters:  09/30/20 (P) 246 lb 8 oz (111.8 kg)  09/17/20 230 lb (104.3 kg)  09/11/20 233 lb (105.7 kg)     General: 65 y.o. female in no acute distress. HEENT: Normocephalic and atraumatic. Sclera clear. EOMs intact. Neck:  Supple. No carotid bruits. No JVD. Heart: *** RRR. Distinct S1 and S2. No murmurs, gallops, or rubs. Radial and distal pedal pulses 2+ and equal bilaterally. Lungs: No increased work of breathing. Clear to ausculation bilaterally. No wheezes, rhonchi, or rales.  Abdomen: Soft, non-distended, and non-tender to palpation. Bowel sounds present in all 4 quadrants.  MSK: Normal strength and tone for age. *** Extremities: No lower extremity edema.    Skin: Warm and dry. Neuro: Alert and oriented x3. No focal deficits. Psych: Normal affect. Responds appropriately.   Assessment:    No diagnosis found.  Plan:     Disposition: Follow up in ***   Medication Adjustments/Labs and Tests Ordered: Current medicines are reviewed at length with the patient today.  Concerns regarding medicines are outlined above.  No orders of the defined types were placed in this encounter.  No orders of the defined types were placed in this encounter.   There are no Patient Instructions on file for this visit.   Signed, Darreld Mclean, PA-C  11/26/2020 8:38 PM    [facility]

## 2020-11-28 ENCOUNTER — Ambulatory Visit: Payer: Medicare Other | Admitting: Cardiovascular Disease

## 2020-11-29 ENCOUNTER — Other Ambulatory Visit: Payer: Self-pay | Admitting: Internal Medicine

## 2020-11-29 DIAGNOSIS — E538 Deficiency of other specified B group vitamins: Secondary | ICD-10-CM

## 2020-12-02 DIAGNOSIS — Z1231 Encounter for screening mammogram for malignant neoplasm of breast: Secondary | ICD-10-CM | POA: Diagnosis not present

## 2020-12-02 DIAGNOSIS — M8588 Other specified disorders of bone density and structure, other site: Secondary | ICD-10-CM | POA: Diagnosis not present

## 2020-12-03 ENCOUNTER — Ambulatory Visit: Payer: Medicare Other | Admitting: Student

## 2020-12-03 LAB — HM MAMMOGRAPHY: HM Mammogram: NORMAL (ref 0–4)

## 2020-12-03 NOTE — Telephone Encounter (Signed)
Theophilus Bones, RN; Sandi Raveling, [place]; [place], Rise Paganini; [place], [place]; 2 others   thanks

## 2020-12-03 NOTE — Telephone Encounter (Signed)
CM sent to Skeet Latch at [facility] for [place].

## 2020-12-04 DIAGNOSIS — J449 Chronic obstructive pulmonary disease, unspecified: Secondary | ICD-10-CM | POA: Diagnosis not present

## 2020-12-09 ENCOUNTER — Telehealth: Payer: Self-pay | Admitting: Internal Medicine

## 2020-12-09 NOTE — Telephone Encounter (Signed)
Pt requesting a call back as she is having headaches, bp problems, and was to have tooth extraction and was unable to have it done.  Pt also has questions about her blood thinner medications and stopping them.  Pt requested to wait until   12/11/2020 @ 1:45 pm. With Dr. Lisabeth Devoid to be seen.

## 2020-12-09 NOTE — Telephone Encounter (Signed)
RTC, patient states she had an appt tomorrow at her dentist for tooth extraction, but the appt was cancelled because she wasn't feeling well and had not stopped her blood thinner.  Pt c/o slight SOB, but states she is still recovering from pneumonia and reports her v/s as 116/71, P-96 and O2 sats 95%.  She states her b/p usually runs 140's/90's and she has experienced some intermittent light headedness and headaches.  States she has been under stress from family recently passing away.  Appt given for tomorrow @ 2:15 w/ Dr. Lisabeth Devoid.  Pt instructed if she develops any worsening symptoms to report to ED, she agrees. SChaplin, RN,BSN

## 2020-12-10 ENCOUNTER — Encounter: Payer: Self-pay | Admitting: Student

## 2020-12-10 ENCOUNTER — Other Ambulatory Visit: Payer: Self-pay

## 2020-12-10 ENCOUNTER — Ambulatory Visit (INDEPENDENT_AMBULATORY_CARE_PROVIDER_SITE_OTHER): Payer: Medicare Other | Admitting: Student

## 2020-12-10 DIAGNOSIS — I483 Typical atrial flutter: Secondary | ICD-10-CM | POA: Diagnosis not present

## 2020-12-10 DIAGNOSIS — J40 Bronchitis, not specified as acute or chronic: Secondary | ICD-10-CM | POA: Diagnosis not present

## 2020-12-10 DIAGNOSIS — I10 Essential (primary) hypertension: Secondary | ICD-10-CM

## 2020-12-10 MED ORDER — AZITHROMYCIN 250 MG PO TABS: ORAL_TABLET | ORAL | 0 refills | Status: AC

## 2020-12-10 NOTE — Patient Instructions (Addendum)
It was a pleasure seeing you in clinic. Today we discussed:   Cough: I have prescribed azithromycin for this.   Blood pressure: Please stop taking you spironolactone, but continue taking your clonidine, diltiazem, and furosemide and bring your log to your next visit  Follow up in 2 weeks   If you have any questions or concerns, please call our clinic at [phone] between 9am-5pm and after hours call [phone] and ask for the internal medicine resident on call. If you feel you are having a medical emergency please call 911.   Thank you, we look forward to helping you remain healthy!

## 2020-12-11 ENCOUNTER — Ambulatory Visit: Payer: Medicare Other | Admitting: Student

## 2020-12-12 NOTE — Progress Notes (Signed)
   CC: cough  HPI:  Shannon [name] is a 65 y.o. female with past medical history below presents for cough. Please refer to problem based charting for further details and assessment and plan of current problem and chronic medical conditions.  Past Medical History:  Diagnosis Date  . Anemia   . Anxiety    HX PANIC ATTACKS  . Arthritis    "starting to; in my hands" (07/09/2015)  . Asthma   . Atrial fibrillation ([place])   . Atrial flutter, paroxysmal ([place])   . Bloated abdomen   . CFS (chronic fatigue syndrome)   . Chewing difficulty   . Chronic asthma with acute exacerbation    "I have chronic asthma all the time; sometimes exacerbations" (07/09/2015)  . Chronic lower back pain   . COPD (chronic obstructive pulmonary disease) ([place])   . Cyst of right kidney    "3 of them; dx'd in ~ 01/2015"  . Dyspnea   . GERD (gastroesophageal reflux disease)   . Heart murmur   . History of blood transfusion    "related to my brain surgery I think"  . History of pulmonary embolism 07/09/2015  . HIV antibody positive ([place])   . HIV disease ([place])   . Hyperlipidemia   . Hypertension   . Leg edema   . Lipodystrophy   . Mild CAD 2013  . Multiple thyroid nodules   . Osteopenia   . Palpitations   . Pneumonia 07/09/2015  . Shingles   . Sleep apnea    "never completed part 2 of study; never wore mask" (07/09/2015)  . Vitamin B 12 deficiency   . Vitamin D deficiency    Review of Systems:  Negative as per HPI  Physical Exam:  Vitals:   12/10/20 1329  BP: (!) 146/91  Pulse: 97  Temp: 97.8 F (36.6 C)  TempSrc: Oral  SpO2: 94%  Weight: 241 lb 8 oz (109.5 kg)  Height: 5\' 3"  (1.6 m)   Constitutional: Appears well-developed and well-nourished. No distress.  HENT: Normocephalic and atraumatic, EOMI, conjunctiva normal, moist mucous membranes Cardiovascular: Normal rate, regular rhythm, S1 and S2 present, no murmurs, rubs, gallops.  Distal pulses intact Respiratory: Coughing intermittently  with yellow green sputum. No respiratory distress, no accessory muscle use.  Effort is normal.  No wheezing, mild crackles of lung bases GI: Nondistended, soft, nontender to palpation, normal active bowel sounds Musculoskeletal: Normal bulk and tone. Trace LEl edema bilaterally Neurological: Is alert and oriented x4, no apparent focal deficits noted. Skin: Warm and dry.  No rash, erythema, lesions noted. Psychiatric: Normal mood and affect. Behavior is normal.   Assessment & Plan:   See Encounters Tab for problem based charting.  Patient discussed with Dr. Jimmye Norman

## 2020-12-12 NOTE — Assessment & Plan Note (Addendum)
Presents with 3 days of increase cough with increased sputum and dyspnea. Notes sputum is green. Denies fever and chills. Does note some lightheadedness and headache. She is coughing intermittently throughout interview. Using O2 intermittently in the day time at home but saturating well in office today on RA. Mild bilateral crackles but no wheezing, breath sounds quiet due to body habitus. Given history of adenocarcinoma of the lung and radiation fibrosis and asthma she ist as risk for post obstructive syndrome. Will treat with short course of azithromycin and monitor for symptom improvement.  Azythromycin for 5 days

## 2020-12-12 NOTE — Assessment & Plan Note (Signed)
Pulse of 94 today, currently rate controlled on diltiazem. Skipped dose of xarelto yesterday as she was supposed to have a dental extraction today, but states she is not feeling well enough to go. Advised she call and reschedule her appointment and stop her Xarelto 1 day before the procdure and resume and day after. She understands and is agreeable to this.

## 2020-12-12 NOTE — Assessment & Plan Note (Addendum)
Patient report a couple of episodes of lightheadedness 3 days ago with associated headache. Notes BP lower than usual at 117. States her BP normally runs around 140. States she stopper her clonidine, lasix and spirolactone and xarelto because of this. Has continue her Cardizem and other mediations. BP in office today elevated in 140-150s.Likely due to stopping medications. Patient also having more cough, sob, and sputum in the last 3 days as well which may be contributing to her symptoms. Advised she continue her clonidine, diltiazem and lasix. Will have her pause her spironolactone to due symptoms and follow up in 2 weeks once she is feeling better to follow up on her BP.

## 2020-12-16 ENCOUNTER — Telehealth: Payer: Medicare Other

## 2020-12-16 NOTE — Progress Notes (Signed)
Internal Medicine Clinic Attending  I saw and evaluated the patient.  I personally confirmed the key portions of the history and exam documented by Dr. Liang and I reviewed pertinent patient test results.  The assessment, diagnosis, and plan were formulated together and I agree with the documentation in the resident's note.  

## 2020-12-19 DIAGNOSIS — R911 Solitary pulmonary nodule: Secondary | ICD-10-CM | POA: Diagnosis not present

## 2020-12-19 DIAGNOSIS — R269 Unspecified abnormalities of gait and mobility: Secondary | ICD-10-CM | POA: Diagnosis not present

## 2020-12-19 DIAGNOSIS — J449 Chronic obstructive pulmonary disease, unspecified: Secondary | ICD-10-CM | POA: Diagnosis not present

## 2020-12-19 DIAGNOSIS — J45909 Unspecified asthma, uncomplicated: Secondary | ICD-10-CM | POA: Diagnosis not present

## 2020-12-25 ENCOUNTER — Encounter: Payer: Self-pay | Admitting: Internal Medicine

## 2020-12-25 ENCOUNTER — Other Ambulatory Visit: Payer: Self-pay

## 2020-12-25 ENCOUNTER — Ambulatory Visit (INDEPENDENT_AMBULATORY_CARE_PROVIDER_SITE_OTHER): Payer: Medicare Other | Admitting: Internal Medicine

## 2020-12-25 DIAGNOSIS — I10 Essential (primary) hypertension: Secondary | ICD-10-CM

## 2020-12-25 NOTE — Assessment & Plan Note (Addendum)
Hypertension: Patient has been keeping a blood pressure log. SBP low 117, high150, and mostly 120 to 130 over the past two weeks. She has not had any headaches or lightheaded feeling since stopping spirolactone. She has scheduled some workouts with [facility] aquatic [facility] and has a exercise bike. She is motivated to lose weight.   Plan: Continue Clonidine .1mg  q12hrs Continue Diltiazem 240 mg daily

## 2020-12-25 NOTE — Progress Notes (Signed)
  [facility] Internal Medicine Residency Telephone Encounter Continuity Care Appointment  HPI:   This telephone encounter was created for [name] on 12/25/2020 for the following purpose/cc hypertension.   Past Medical History:  Past Medical History:  Diagnosis Date  . Anemia   . Anxiety    HX PANIC ATTACKS  . Arthritis    "starting to; in my hands" (07/09/2015)  . Asthma   . Atrial fibrillation ([place])   . Atrial flutter, paroxysmal ([place])   . Bloated abdomen   . CFS (chronic fatigue syndrome)   . Chewing difficulty   . Chronic asthma with acute exacerbation    "I have chronic asthma all the time; sometimes exacerbations" (07/09/2015)  . Chronic lower back pain   . COPD (chronic obstructive pulmonary disease) ([place])   . Cyst of right kidney    "3 of them; dx'd in ~ 01/2015"  . Dyspnea   . GERD (gastroesophageal reflux disease)   . Heart murmur   . History of blood transfusion    "related to my brain surgery I think"  . History of pulmonary embolism 07/09/2015  . HIV antibody positive ([place])   . HIV disease ([place])   . Hyperlipidemia   . Hypertension   . Leg edema   . Lipodystrophy   . Mild CAD 2013  . Multiple thyroid nodules   . Osteopenia   . Palpitations   . Pneumonia 07/09/2015  . Shingles   . Sleep apnea    "never completed part 2 of study; never wore mask" (07/09/2015)  . Vitamin B 12 deficiency   . Vitamin D deficiency       ROS:  Review of Systems  Respiratory: Positive for shortness of breath. Negative for cough.   Neurological: Negative for dizziness and headaches.       Assessment / Plan / Recommendations:   Please see A&P under problem oriented charting for assessment of the patient's acute and chronic medical conditions.   As always, pt is advised that if symptoms worsen or new symptoms arise, they should go to an urgent care facility or to to ER for further evaluation.   Consent and Medical Decision Making:   Patient discussed with  Dr. Dareen Piano  This is a telephone encounter between Shannon [name] and Lorene Dy on 12/25/2020 for hypertension. The visit was conducted with the patient located at home and Lorene Dy at [facility]. The patient's identity was confirmed using their DOB and current address. The patient has consented to being evaluated through a telephone encounter and understands the associated risks (an examination cannot be done and the patient may need to come in for an appointment) / benefits (allows the patient to remain at home, decreasing exposure to coronavirus). I personally spent 15 minutes on medical discussion.

## 2020-12-26 NOTE — Progress Notes (Signed)
Internal Medicine Clinic Attending  Case discussed with Dr. Steen  At the time of the visit.  We reviewed the resident's history and exam and pertinent patient test results.  I agree with the assessment, diagnosis, and plan of care documented in the resident's note.  

## 2020-12-31 ENCOUNTER — Telehealth: Payer: Medicare Other

## 2021-01-01 ENCOUNTER — Encounter: Payer: Self-pay | Admitting: Radiology

## 2021-01-01 ENCOUNTER — Telehealth: Payer: Self-pay

## 2021-01-01 ENCOUNTER — Telehealth: Payer: Self-pay | Admitting: Radiology

## 2021-01-01 NOTE — Telephone Encounter (Signed)
Patient had tele appt on 5/4 with [facility] Team and in person appt on 4/19 with [facility] Team. Call placed to patient.  States she left [employer] in 2019 2/2 SHOB and asthma attacks. Then dx with lung cancer. She has reached out to Pulmonary and oncology for letters but also needs one from PCP stating she is medically cleared to return to [employer] in August of 2022.

## 2021-01-01 NOTE — Telephone Encounter (Signed)
Requesting medical clearance to go back to school, please call pt back.

## 2021-01-01 NOTE — Telephone Encounter (Signed)
Patient requests return to school letter stating she is under the care of radiation oncology and medically cleared to return to school in August 2022 after being diagnosed with lung cancer on 02/13/2020.

## 2021-01-05 ENCOUNTER — Other Ambulatory Visit: Payer: Self-pay | Admitting: Internal Medicine

## 2021-01-06 ENCOUNTER — Ambulatory Visit: Payer: Medicare Other | Admitting: Pulmonary Disease

## 2021-01-10 ENCOUNTER — Encounter: Payer: Self-pay | Admitting: Internal Medicine

## 2021-01-13 ENCOUNTER — Encounter: Payer: Self-pay | Admitting: Internal Medicine

## 2021-01-13 ENCOUNTER — Ambulatory Visit: Payer: Medicare Other | Admitting: Internal Medicine

## 2021-01-13 NOTE — Progress Notes (Signed)
Spoke to [employer] and Susette Racer to send records request on behalf of Dr. Daryll Drown in an attempt to receive patients most recent mammogram.   Per pt mychart response, last mammo was done at [facility] and the request is being sent there for the records.

## 2021-01-14 ENCOUNTER — Ambulatory Visit: Payer: Medicare Other | Admitting: *Deleted

## 2021-01-14 DIAGNOSIS — C3492 Malignant neoplasm of unspecified part of left bronchus or lung: Secondary | ICD-10-CM

## 2021-01-14 DIAGNOSIS — I10 Essential (primary) hypertension: Secondary | ICD-10-CM

## 2021-01-14 DIAGNOSIS — J4489 Other specified chronic obstructive pulmonary disease: Secondary | ICD-10-CM

## 2021-01-14 DIAGNOSIS — I5032 Chronic diastolic (congestive) heart failure: Secondary | ICD-10-CM

## 2021-01-14 DIAGNOSIS — N183 Chronic kidney disease, stage 3 unspecified: Secondary | ICD-10-CM

## 2021-01-14 DIAGNOSIS — B20 Human immunodeficiency virus [HIV] disease: Secondary | ICD-10-CM

## 2021-01-14 DIAGNOSIS — I483 Typical atrial flutter: Secondary | ICD-10-CM

## 2021-01-14 DIAGNOSIS — J449 Chronic obstructive pulmonary disease, unspecified: Secondary | ICD-10-CM

## 2021-01-14 NOTE — Patient Instructions (Signed)
Visit Information It was nice speaking with you today. Goals Addressed              This Visit's Progress     Patient Stated   .  COMPLETED: "I have decided I don't want personal care services" (pt-stated)        CARE PLAN ENTRY (see longitudinal plan of care for additional care plan information)  Current Barriers:  . Chronic Disease Management support, education, and care coordination needs related to CAD, HTN, HLD, COPD, CKD Stage 3, Anxiety, and Asthma- spoke with patient , say she is doing well, says she is able to navigate the stairs in her home without excessive dyspnea, has many different providers appointments coming up including dental appointment with Dr. Graciella Freer to have her dentures adjusted, with her eye MD Dr Idolina Primer, she is scheduled for a bone density test, with her pulmonologist Dr Elsworth Soho or his NP Rexene Edison and she is going to [employer] in [place] tomorrow for a portable oxygen concentrator and lift chair. She reports her severe back pain has resolved. She says the very uncomfortable hospital bed was picked up  per her request. she again voiced much appreciation for the assistance of the CCM team.   Clinical Goal(s) related to CAD, HTN, HLD, COPD, CKD Stage 3, Anxiety, and Asthma:  Over the next 30 days, patient will:  . Work with the care management team to address educational, disease management, and care coordination needs  . Begin or continue self health monitoring activities as directed today  work with care management team to secure DME and home care assistance . Call provider office for new or worsened signs and symptoms CAD, HTN, HLD, COPD, CKD Stage 3, Anxiety, and Asthma . Call care management team with questions or concerns . Verbalize basic understanding of patient centered plan of care established today  Interventions related to CAD, HTN, HLD, COPD, CKD Stage 3, Anxiety, and Asthma:    . Inter-disciplinary care team collaboration (see longitudinal plan of  care) . Evaluation of current treatment plan related to HTN, asthma,  COPD and HIV patient's adherence to plan as established by provider. . Shared this CCM RN's concerns about patient deciding against personal care services since she lives alone in a 2 story apartment and has no family of friends in town . Reviewed medications with patient and discussed medication taking behavior . Reviewed upcoming appointments and ensured she has transportation. . Discussed plans with patient for ongoing care management follow up and ensured patient has contact number for CCM team and clinic . Please see past updates related to this goal by clicking on the "Past Updates" button in the selected goal        Other   .  Make and Keep All Appointments        Timeframe:  Long-Range Goal Priority:  High Start Date:         08/28/20                    Expected End Date:       02/20/21                Follow Up Date 01/15/21   - arrange a ride through an agency 1 week before appointment - ask family or friend for a ride - call to cancel if needed - keep a calendar with appointment dates    Why is this important?    Part of staying healthy is seeing  the doctor for follow-up care.   If you forget your appointments, there are some things you can do to stay on track.    Notes: meeting goal    .  Manage Fatigue (Tiredness- Cancer Treatment)- patient diagnosed with lung cancer June 2021        Follow Up Date 02/20/21   - eat healthy - get a least 8 hours of sleep at night - use devices that will help like a cane, sock-puller or reacher - use meditation or relaxation techniques    Why is this important?   Cancer treatment and its side effects can drain your energy. It can keep you from doing things you would like to do.  There are many things that you can do to manage fatigue.    Notes: 01/14/21- Patient states she has joined [employer] and will participate senior exercise at [employer]      .  Track and Manage My Symptoms-COPD/asthma        Timeframe:  Long-Range Goal Priority:  High Start Date:              08/28/20               Expected End Date:           ongoing           Follow Up Date 02/20/21   - eliminate symptom triggers at home - follow rescue plan if symptoms flare-up - keep follow-up appointments - use an extra pillow to sleep    Why is this important?    Tracking your symptoms and other information about your health helps your doctor plan your care.   Write down the symptoms, the time of day, what you were doing and what medicine you are taking.   You will soon learn how to manage your symptoms.     Notes: no recent asthma/COPD exacerbations per patient, says she uses O2 at hs and during the day prn       The patient verbalized understanding of instructions, educational materials, and care plan provided today and declined offer to receive copy of patient instructions, educational materials, and care plan.   The care management team will reach out to the patient again over the next 30-60 days.   Kelli Churn RN, CCM, [facility] RN Care Manager [phone]

## 2021-01-14 NOTE — Chronic Care Management (AMB) (Signed)
Care Management    RN Visit Note  01/14/2021 Name: Shannon [name] MRN: [id number] DOB: 09-23-1955  Subjective: Shannon [name] is a 65 y.o. year old female who is a primary care patient of [name], MD. The care management team was consulted for assistance with disease management and care coordination needs.    Engaged with patient by telephone for follow up visit in response to provider referral for case management and/or care coordination services.   Consent to Services:   Shannon [name] was given information about Care Management services today including:  1. Care Management services includes personalized support from designated clinical staff supervised by her physician, including individualized plan of care and coordination with other care providers 2. 24/7 contact phone numbers for assistance for urgent and routine care needs. 3. The patient may stop case management services at any time by phone call to the office staff.  Patient agreed to services and consent obtained.   Assessment: Review of patient past medical history, allergies, medications, health status, including review of consultants reports, laboratory and other test data, was performed as part of comprehensive evaluation and provision of chronic care management services.   SDOH (Social Determinants of Health) assessments and interventions performed:    Care Plan  Allergies  Allergen Reactions  . Tree Extract Swelling and Other (See Comments)    Swelling to eyes  . Augmentin [Amoxicillin-Pot Clavulanate] Other (See Comments)    Headache, dizzy  . Lisinopril Cough    Face/throat swelling  . Ciprofloxacin Hives    Outpatient Encounter Medications as of 01/14/2021  Medication Sig Note  . acetaminophen (TYLENOL) 325 MG tablet Take 325 mg by mouth every 6 (six) hours as needed (FOR PAIN).   [name] albuterol (PROVENTIL) (2.5 MG/3ML) 0.083% nebulizer solution Take 3 mLs (2.5 mg total) by nebulization every 6 (six) hours as  needed for wheezing or shortness of breath.   . Ascorbic Acid (VITAMIN C) 1000 MG tablet Take 1,000 mg by mouth every other day.    . b complex vitamins tablet Take 1 tablet by mouth daily.   [name] BIKTARVY 50-200-25 MG TABS tablet TAKE 1 TABLET BY MOUTH DAILY   . BIOTIN PO Take 1 tablet by mouth daily as needed (for supplementation).    . calcium carbonate (OSCAL) 1500 (600 Ca) MG TABS tablet Take 600 mg of elemental calcium by mouth daily with breakfast.   . Cholecalciferol (SM VITAMIN D3) 100 MCG (4000 UT) CAPS Take 1 capsule (4,000 Units total) by mouth daily.   . cloNIDine (CATAPRES) 0.1 MG tablet Take 1 tablet (0.1 mg total) by mouth every 12 (twelve) hours.   . cyanocobalamin (,VITAMIN B-12,) 1000 MCG/ML injection ADMINISTER 1 ML(1000 MCG) IN THE MUSCLE EVERY 30 DAYS   . cyclobenzaprine (FLEXERIL) 5 MG tablet Take 1 tablet (5 mg total) by mouth 3 (three) times daily as needed for muscle spasms. Start taking at night to see how they affect you.   . diltiazem (CARDIZEM CD) 240 MG 24 hr capsule Take 1 capsule (240 mg total) by mouth daily.   . fluticasone furoate-vilanterol (BREO ELLIPTA) 200-25 MCG/INH AEPB Inhale 1 puff into the lungs daily.   . furosemide (LASIX) 20 MG tablet Take 20 mg by mouth every other day.   [name] HYDROcodone-acetaminophen (NORCO/VICODIN) 5-325 MG tablet Take 1 tablet by mouth 2 (two) times daily as needed for severe pain.   [name] MAGNESIUM PO Take 1 tablet by mouth every other day.    [name]  Melatonin 5 MG TABS Take 5 mg by mouth at bedtime as needed (for sleep).   . Misc. Devices (PULSE OXIMETER FOR FINGER) MISC 1 Units by Does not apply route as needed.   . Mouthwashes (BIOTENE DRY MOUTH GENTLE) LIQD Use as directed 1 Dose in the mouth or throat 2 (two) times daily as needed (dry mouth).   . Omega-3 Fatty Acids (FISH OIL) 1000 MG CAPS Take 1,000 mg by mouth daily as needed (pt prefrence).   [name] omeprazole (PRILOSEC) 40 MG capsule Take 1 capsule (40 mg total) by mouth daily as needed  (heart burn).   . OXYGEN Inhale 1.5-2 L into the lungs See admin instructions. Uses at bedtime and through the day as needed   . PROAIR HFA 108 (90 Base) MCG/ACT inhaler Inhale 2 puffs into the lungs every 4 (four) hours as needed for wheezing or shortness of breath.   . senna (SENOKOT) 8.6 MG tablet Take 1 tablet by mouth daily as needed for constipation.   [name] spironolactone (ALDACTONE) 25 MG tablet TAKE 1 TABLET(25 MG) BY MOUTH DAILY   . SYRINGE/NEEDLE, DISP, 1 ML (B-D SYRINGE/NEEDLE 1CC/25GX5/8) 25G X 5/8" 1 ML MISC 1 Units by Does not apply route every 30 (thirty) days.   . vitamin A 3 MG (10000 UNITS) capsule Take 10,000 Units by mouth daily.   Alveda Reasons 20 MG TABS tablet TAKE 1 TABLET(20 MG) BY MOUTH DAILY WITH SUPPER (Patient taking differently: Take 20 mg by mouth daily.) 08/20/2020: Unk time  . zinc gluconate 50 MG tablet Take 50 mg by mouth daily.    No facility-administered encounter medications on file as of 01/14/2021.    Patient Active Problem List   Diagnosis Date Noted  . On supplemental oxygen therapy 11/19/2020  . Insomnia 11/14/2020  . Burn erythema of breast, initial encounter 11/05/2020  . Acute back pain 09/17/2020  . Shoulder blade pain 08/09/2020  . Skin ulcer ([place]) 06/24/2020  . Bronchitis 03/07/2020  . Pulmonary nodule 02/14/2020  . Nodule of upper lobe of left lung 02/06/2020  . Thyroid nodule 01/26/2020  . Adenocarcinoma of lung ([place]) 01/04/2020  . Chronic obstructive asthma ([place]) 12/29/2019  . Myalgia 10/24/2019  . (HFpEF) heart failure with preserved ejection fraction ([place]) 02/07/2019  . Leg swelling 11/03/2018  . Other fatigue 06/30/2018  . Shortness of breath on exertion 06/30/2018  . Hyperglycemia 06/30/2018  . Vitamin D deficiency 06/30/2018  . Chronic anticoagulation 01/03/2018  . Chronic respiratory failure with hypoxia ([place]) 11/30/2017  . Constipation 11/03/2017  . Atrial flutter ([place]) 03/11/2017  . CAD (coronary artery disease) 01/28/2017  .  Obesity (BMI 30-39.9) 01/13/2017  . Ascending aorta dilatation (H[facility]) 01/07/2017  . Low back pain radiating to right lower extremity 12/02/2016  . Acquired cyst of kidney 12/02/2016  . Chronic kidney disease (CKD), stage III (moderate) ([place]) 12/01/2016  . Generalized anxiety disorder 10/22/2016  . Hypersomnia 10/01/2016  . Accessory skin tags 06/10/2016  . Nocturnal hypoxemia 05/13/2016  . Cervical radiculopathy 04/14/2016  . GERD (gastroesophageal reflux disease) 01/02/2016  . Morbid (severe) obesity due to excess calories ([place]) 10/10/2015  . Vitamin B12 deficiency 10/02/2015  . Moderate persistent asthma with exacerbation 07/09/2015  . HIV disease ([place]) 07/09/2015  . Uncontrolled hypertension 07/09/2015    Conditions to be addressed/monitored: HTN, CAD, HLD, CKD, Asthma, COPD, lung ca 6/21, HIV, HF  Care Plan : CCM RN- Cancer Treatment Phase (Adult)- patient diagnosed with lung cancer June 2021  Updates made by Barrington Ellison,  RN since 01/14/2021 12:00 AM    Problem: Patient lives alone with no family nearby with recent  diagnosis of lung cancer ( June 2021) with numerous chronic disease states   Priority: High  Onset Date: 08/28/2020    Goal: Disease Progression Minimized or Managed   Start Date: 08/28/2020  Expected End Date: 05/23/2021  Recent Progress: On track  Priority: High  Note:   Current Barriers:  . Chronic Disease Management support and education needs related to [facility], CAD, HIV,  lung Ca diagnosed June 2021 . Unable to independently manage chronic disease states- successful outreach to patient via phone to complete follow up assessment, patient states she is doing well, intends to start school ([employer]) in the fall to improve her computer skills and to secure a part time job that she can work from home, she says now that she feels better she wants to be more active and  has joined [employer] and enrolled in the senior aquatic exercise program at the  [employer], she says she loves her [employer] home health aide provided by [facility] agency and that her total hours of [employer] services were increased to 80 hrs per month (20 hrs per week) , she voices great appreciation to the CCM team that helped her secure the services, she says she uses a cane when she is ambulating outside her home and uses O2 at hs and prn during the day, she says she feels she is benefiting from her counseling sessions with Dr Theodis Shove, clinic counselor and plans to make a follow up appointment soon, she is requesting a letter from her provider stating she may return to school   Nurse Case Manager Clinical Goal(s):  [name] Over the next 30-60 days, patient will attend all scheduled medical appointments: related to chronic disease states . Over the next 30-60  days, the patient will demonstrate ongoing self health care management ability as evidenced by no unplanned hospital admissions   Interventions:  . 1:1 collaboration with [name], MD regarding development and update of comprehensive plan of care as evidenced by provider attestation and co-signature . Inter-disciplinary care team collaboration (see longitudinal plan of care) . Evaluation of current treatment plan related to chronic disease states and patient's adherence to plan as established by provider. . Assessed medication taking behavior . Positive reinforcement given  to patient for plans to enroll in school and joining exercise programs at 2 facilities . Discussed plans with patient for ongoing care management follow up and provided patient with direct contact information for care management team . Reviewed scheduled/upcoming provider appointments including: with Dr Elsworth Soho on 6/2 and with Dr Sondra Come on  6/13 and 8/8 and ensured she has transportation . Per patient request, mailed patient 3 copies of the letter from Dr Sondra Come dictated on 01/01/21 allowing her to enroll in school in August along with a [facility] [employer] spiral bound calendar with heath education on heart failure and HTN and COPD   Patient Goals/Self-Care Activities Over the next 30-60 days, patient will:  - Patient will self administer medications as prescribed Patient will attend all scheduled provider appointments Patient will call pharmacy for medication refills Patient will continue to perform ADL's independently Patient will continue to perform IADL's independently Patient will call provider office for new concerns or questions - eliminate COPD/Asthma symptom triggers at home - follow rescue plan if symptoms flare-up - keep follow-up appointments - use an extra pillow to sleep - arrange  a ride through an agency 1 week before appointment - ask family or friend for a ride - call to cancel if needed - keep a calendar with appointment dates - eat healthy - get a least 8 hours of sleep at night - use devices that will help like a cane, sock-puller or reacher - use meditation or relaxation techniques - wash my hands with mild soap and water - wash my hands after using the bathroom - wash my hands after coughing, sneezing or blowing nose - stay away from people who are sick - begin personal counseling on 09/05/20 - talk about feelings with a friend, family or spiritual advisor - practice positive thinking and self-talk  Follow Up Plan: The care management team will reach out to the patient again over the next 30-60 days.          Kelli Churn RN, CCM, [facility] RN Care Manager [phone]

## 2021-01-16 ENCOUNTER — Telehealth: Payer: Self-pay | Admitting: *Deleted

## 2021-01-16 ENCOUNTER — Other Ambulatory Visit: Payer: Self-pay | Admitting: Cardiovascular Disease

## 2021-01-16 NOTE — Telephone Encounter (Signed)
Call from pt stating she wants to come in b/c she has several problems. First she states she getting a respiratory infection d/t the pollen and weather. Secondly, states she has an area on right leg,near a vein,which is swollen, red, and "it hurts" - x 2 days. States she on a blood thinner, xarelto, and afraid she may have a clot.we do not have any appointments until Tuesday of next week. Advised pt to go to the ED; or [facility] urgent care or [facility] on [address]. States she does not drive but she will able to go to either facility.

## 2021-01-16 NOTE — Telephone Encounter (Signed)
Thanks.  Agree with plan for her to get checked out.

## 2021-01-18 DIAGNOSIS — J45909 Unspecified asthma, uncomplicated: Secondary | ICD-10-CM | POA: Diagnosis not present

## 2021-01-18 DIAGNOSIS — R269 Unspecified abnormalities of gait and mobility: Secondary | ICD-10-CM | POA: Diagnosis not present

## 2021-01-18 DIAGNOSIS — J449 Chronic obstructive pulmonary disease, unspecified: Secondary | ICD-10-CM | POA: Diagnosis not present

## 2021-01-18 DIAGNOSIS — R911 Solitary pulmonary nodule: Secondary | ICD-10-CM | POA: Diagnosis not present

## 2021-01-21 ENCOUNTER — Telehealth: Payer: Self-pay | Admitting: Pulmonary Disease

## 2021-01-21 NOTE — Telephone Encounter (Signed)
I called and spoke with patient regarding appt. Patient had to cancel appt due to oral surgery being same the day. Patient is having some issues with leg swelling and pain in legs and wants to talk to Dr. Elsworth Soho about seeing a vein doctor and is needing clearance to go back to school. Patient has not seen Dr. Elsworth Soho in a while and only wants to see him. Next opening in July and patient does not want to wait that long. Will route to Dr. Elsworth Soho  Dr. Elsworth Soho, is it ok to put patient in a block spot that you have next week here in the [place] office? Thanks!

## 2021-01-21 NOTE — Telephone Encounter (Signed)
Please offer her appt on 6/2 @ noon

## 2021-01-22 ENCOUNTER — Ambulatory Visit (INDEPENDENT_AMBULATORY_CARE_PROVIDER_SITE_OTHER): Payer: Medicare Other | Admitting: Internal Medicine

## 2021-01-22 ENCOUNTER — Encounter: Payer: Self-pay | Admitting: Internal Medicine

## 2021-01-22 ENCOUNTER — Ambulatory Visit: Payer: Medicare Other | Admitting: Medical

## 2021-01-22 VITALS — BP 155/51 | HR 77 | Temp 98.3°F | Ht 63.0 in | Wt 240.2 lb

## 2021-01-22 DIAGNOSIS — Z1211 Encounter for screening for malignant neoplasm of colon: Secondary | ICD-10-CM | POA: Diagnosis not present

## 2021-01-22 DIAGNOSIS — M793 Panniculitis, unspecified: Secondary | ICD-10-CM | POA: Diagnosis not present

## 2021-01-22 DIAGNOSIS — Z0000 Encounter for general adult medical examination without abnormal findings: Secondary | ICD-10-CM | POA: Diagnosis not present

## 2021-01-22 DIAGNOSIS — Z13220 Encounter for screening for lipoid disorders: Secondary | ICD-10-CM

## 2021-01-22 DIAGNOSIS — J449 Chronic obstructive pulmonary disease, unspecified: Secondary | ICD-10-CM | POA: Diagnosis not present

## 2021-01-22 NOTE — Telephone Encounter (Signed)
Spoke with the pt and scheduled ov with RA for 12 noon 01/23/21.

## 2021-01-22 NOTE — Patient Instructions (Addendum)
Shannon [name],   It was a pleasure meeting you today. Today we discussed the following:   1. Leg pain-this looks like inflammation likely due to some increased fluid in her legs.  Recommend continuing her compression socks for about 12 hours a day for the next few weeks to see if this improves the leg pain.  No evidence of blood clots. 2. Your blood pressure reading was slightly elevated today.  We will recheck this in about 4 weeks.  Please bring your home cuff to your office visit. 3. Colon cancer screening-I have ordered the stool cards for you.    Please call the internal medicine center clinic if you have any questions or concerns, we may be able to help and keep you from a long and expensive emergency room wait. Our clinic and after hours phone number is [phone], the best time to call is Monday through Friday 9 am to 4 pm but there is always someone available 24/7 if you have an emergency. If you need medication refills please notify your pharmacy one week in advance and they will send [place] a request.   Thank you for allowing [place] to be a part of your care!

## 2021-01-23 ENCOUNTER — Ambulatory Visit: Payer: Medicare Other | Admitting: Pulmonary Disease

## 2021-01-23 DIAGNOSIS — M793 Panniculitis, unspecified: Secondary | ICD-10-CM | POA: Insufficient documentation

## 2021-01-23 DIAGNOSIS — Z0000 Encounter for general adult medical examination without abnormal findings: Secondary | ICD-10-CM | POA: Insufficient documentation

## 2021-01-23 NOTE — Progress Notes (Signed)
CC: leg pain  HPI:  [name] is a 65 y.o. the past medical history listed below presenting for evaluation of leg pain. For details of today's visit and the status of his chronic medical issues please refer to the assessment and plan.   Past Medical History:  Diagnosis Date  . Anemia   . Anxiety    HX PANIC ATTACKS  . Arthritis    "starting to; in my hands" (07/09/2015)  . Asthma   . Atrial fibrillation ([place])   . Atrial flutter, paroxysmal ([place])   . Bloated abdomen   . CFS (chronic fatigue syndrome)   . Chewing difficulty   . Chronic asthma with acute exacerbation    "I have chronic asthma all the time; sometimes exacerbations" (07/09/2015)  . Chronic lower back pain   . COPD (chronic obstructive pulmonary disease) ([place])   . Cyst of right kidney    "3 of them; dx'd in ~ 01/2015"  . Dyspnea   . GERD (gastroesophageal reflux disease)   . Heart murmur   . History of blood transfusion    "related to my brain surgery I think"  . History of pulmonary embolism 07/09/2015  . HIV antibody positive ([place])   . HIV disease ([place])   . Hyperlipidemia   . Hypertension   . Leg edema   . Lipodystrophy   . Mild CAD 2013  . Multiple thyroid nodules   . Osteopenia   . Palpitations   . Pneumonia 07/09/2015  . Shingles   . Sleep apnea    "never completed part 2 of study; never wore mask" (07/09/2015)  . Vitamin B 12 deficiency   . Vitamin D deficiency    Review of Systems:   Review of Systems  Constitutional: Negative for chills, fever and malaise/fatigue.  Cardiovascular: Negative for chest pain and leg swelling.  Gastrointestinal: Negative for abdominal pain, nausea and vomiting.  Musculoskeletal: Positive for joint pain and myalgias. Negative for falls.     Physical Exam:  Vitals:   01/22/21 1342 01/22/21 1503  BP: (!) 162/83 (!) 155/51  Pulse: 95 77  Temp: 98.3 F (36.8 C)   TempSrc: Oral   SpO2: 90%   Weight: 240 lb 3.2 oz (109 kg)   Height: 5\' 3"  (1.6 m)     Physical Exam Vitals reviewed.  Constitutional:      General: She is not in acute distress.    Appearance: Normal appearance. She is not ill-appearing.  Cardiovascular:     Rate and Rhythm: Normal rate and regular rhythm.     Pulses: Normal pulses.     Heart sounds: Normal heart sounds. No murmur heard. No friction rub. No gallop.   Pulmonary:     Effort: Pulmonary effort is normal. No respiratory distress.     Breath sounds: Normal breath sounds. No wheezing or rales.  Abdominal:     General: Abdomen is flat. Bowel sounds are normal. There is no distension.     Palpations: Abdomen is soft.     Tenderness: There is no abdominal tenderness.  Musculoskeletal:        General: Swelling and tenderness present.     Right lower leg: Edema present.     Left lower leg: Edema present.  Skin:    General: Skin is warm and dry.     Findings: Erythema present.     Comments: 2 to 3 cm ill-defined region of erythema along the anterior distal tibia, smaller 1 to 2 cm nodule  just inferomedial to the anterior knee  Neurological:     Mental Status: She is alert.  Psychiatric:        Mood and Affect: Mood normal.        Behavior: Behavior normal.        Thought Content: Thought content normal.        Judgment: Judgment normal.       Assessment & Plan:   See Encounters Tab for problem based charting.  Patient discussed with Dr. Evette Doffing

## 2021-01-23 NOTE — Assessment & Plan Note (Addendum)
Patient presents with right lower leg pain and swelling with 2 ill-defined erythematous areas along the anterior portion of the right lower leg.  States that Shannon [name] noticed these lesions several weeks ago and they have been tender to palpation.  Shannon [name] denies any injury or trauma.  No insect bites.  Shannon [name] has been elevating her legs at nighttime.  Differential diagnosis includes lipodermatosclerosis (a form of panniculitis) versus insect bite less likely.  No evidence of blood clots.  Point-of-care ultrasound performed during clinic visit which showed cobblestone appearance of the soft tissues consistent with subcutaneous inflammation.      Assessment and plan: Differential diagnosis includes lipodermatosclerosis.  -Recommended compression stockings at least 12 hours daily and keeping legs elevated at nighttime -Continue to monitor

## 2021-01-23 NOTE — Assessment & Plan Note (Signed)
Patient is due for colonoscopy however states between her leg pain and tooth pain she wants to defer at this time.  Agreeable to the fit test, cards provided.  Also checking her lipid profile.

## 2021-01-24 NOTE — Progress Notes (Signed)
Internal Medicine Clinic Attending  I saw and evaluated the patient.  I personally confirmed the key portions of the history and exam documented by Dr. Laural Golden and I reviewed pertinent patient test results.  The assessment, diagnosis, and plan were formulated together and I agree with the documentation in the resident's note.  Patient with chronic venous insufficiency here with tender subcutaneous nodules in the pretibial space. I think these either represent lipodermatosclerosis or erythema nodosum. We talked about supportive care and I anticipate these will improve with time and compression.

## 2021-01-29 ENCOUNTER — Encounter: Payer: Self-pay | Admitting: Radiation Oncology

## 2021-02-03 ENCOUNTER — Ambulatory Visit
Admission: RE | Admit: 2021-02-03 | Discharge: 2021-02-03 | Disposition: A | Discharge status: Home / Self Care | Payer: Medicare Other | Source: Ambulatory Visit | Attending: Radiation Oncology | Admitting: Radiation Oncology

## 2021-02-03 HISTORY — DX: Personal history of irradiation: Z92.3

## 2021-02-12 ENCOUNTER — Other Ambulatory Visit: Payer: Self-pay | Admitting: Internal Medicine

## 2021-02-18 ENCOUNTER — Telehealth: Payer: Medicare Other

## 2021-02-18 DIAGNOSIS — J45909 Unspecified asthma, uncomplicated: Secondary | ICD-10-CM | POA: Diagnosis not present

## 2021-02-18 DIAGNOSIS — R911 Solitary pulmonary nodule: Secondary | ICD-10-CM | POA: Diagnosis not present

## 2021-02-18 DIAGNOSIS — R269 Unspecified abnormalities of gait and mobility: Secondary | ICD-10-CM | POA: Diagnosis not present

## 2021-02-18 DIAGNOSIS — J449 Chronic obstructive pulmonary disease, unspecified: Secondary | ICD-10-CM | POA: Diagnosis not present

## 2021-02-19 ENCOUNTER — Ambulatory Visit (INDEPENDENT_AMBULATORY_CARE_PROVIDER_SITE_OTHER): Payer: Medicare Other | Admitting: Internal Medicine

## 2021-02-19 DIAGNOSIS — I10 Essential (primary) hypertension: Secondary | ICD-10-CM | POA: Diagnosis not present

## 2021-02-19 DIAGNOSIS — M793 Panniculitis, unspecified: Secondary | ICD-10-CM

## 2021-02-19 NOTE — Progress Notes (Signed)
  [facility] Internal Medicine Residency Telephone Encounter Continuity Care Appointment  HPI:  This telephone encounter was created for Ms. Ocie Bob Asfaw on 02/19/2021 for the following purpose/cc leg swelling and HTN.   Past Medical History:  Past Medical History:  Diagnosis Date   Anemia    Anxiety    HX PANIC ATTACKS   Arthritis    "starting to; in my hands" (07/09/2015)   Asthma    Atrial fibrillation (HCC)    Atrial flutter, paroxysmal (HCC)    Bloated abdomen    CFS (chronic fatigue syndrome)    Chewing difficulty    Chronic asthma with acute exacerbation    "I have chronic asthma all the time; sometimes exacerbations" (07/09/2015)   Chronic lower back pain    COPD (chronic obstructive pulmonary disease) ([place])    Cyst of right kidney    "3 of them; dx'd in ~ 01/2015"   Dyspnea    GERD (gastroesophageal reflux disease)    Heart murmur    History of blood transfusion    "related to my brain surgery I think"   History of pulmonary embolism 07/09/2015   History of radiation therapy 04/09/20-04/22/20   Left Lung, Dr. Gery Pray   HIV antibody positive ([place])    HIV disease ([place])    Hyperlipidemia    Hypertension    Leg edema    Lipodystrophy    Mild CAD 2013   Multiple thyroid nodules    Osteopenia    Palpitations    Pneumonia 07/09/2015   Shingles    Sleep apnea    "never completed part 2 of study; never wore mask" (07/09/2015)   Vitamin B 12 deficiency    Vitamin D deficiency      ROS:  Review of Systems  Constitutional:  Negative for chills and fever.  Respiratory:  Negative for shortness of breath.   Cardiovascular:  Positive for leg swelling.  Gastrointestinal:  Negative for nausea and vomiting.     Assessment / Plan / Recommendations:  Please see A&P under problem oriented charting for assessment of the patient's acute and chronic medical conditions.  As always, pt is advised that if symptoms worsen or new symptoms arise, they should go to an  urgent care facility or to to ER for further evaluation.   Consent and Medical Decision Making:  Patient discussed with Dr. Philipp Ovens This is a telephone encounter between [name] and [place] on 02/19/2021 for leg swelling and HTN. The visit was conducted with the patient located at home and Amalya Salmons N Cleaster Shiffer at [facility]. The patient's identity was confirmed using their DOB and current address. The patient has consented to being evaluated through a telephone encounter and understands the associated risks (an examination cannot be done and the patient may need to come in for an appointment) / benefits (allows the patient to remain at home, decreasing exposure to coronavirus). I personally spent 18 minutes on medical discussion.

## 2021-02-19 NOTE — Assessment & Plan Note (Signed)
Patient states her leg swelling and pain has improved. She states she still has some swelling and redness but states it has gotten better. She has been wearing the compression socks overnight and most of the day. She also recently enrolled at [facility] to take classes and also at a local gym. She states when she is up and moving it tends to help her swelling. Encouraged to stay active and continue using the compression socks. Patient to RTC if symptoms persist or worsen.

## 2021-02-19 NOTE — Assessment & Plan Note (Addendum)
Patient reports blood pressures at home have ranged from 130-140s. She states today it was 145/72. She denies any headaches, chest pain or short of breath. She is currently taking clonidine 0.1 mg q12h, spironolactone 25 mg and diltiazem 240 mg daily.  Discussed keeping a blood pressure log over the next couple weeks just checking a few times a week.  Can consider titrating clonidine at the next visit.

## 2021-02-20 ENCOUNTER — Telehealth: Payer: Self-pay | Admitting: *Deleted

## 2021-02-20 ENCOUNTER — Ambulatory Visit
Admission: RE | Admit: 2021-02-20 | Discharge: 2021-02-20 | Disposition: A | Discharge status: Home / Self Care | Payer: Medicare Other | Source: Ambulatory Visit | Attending: Radiation Oncology | Admitting: Radiation Oncology

## 2021-02-20 NOTE — Telephone Encounter (Signed)
RETURNED PATIENT'S PHONE CALL, SPOKE WITH PATIENT. ?

## 2021-02-20 NOTE — Progress Notes (Signed)
Internal Medicine Clinic Attending ° °Case discussed with Dr. Rehman  At the time of the visit.  We reviewed the resident’s history and exam and pertinent patient test results.  I agree with the assessment, diagnosis, and plan of care documented in the resident’s note.  ° °

## 2021-02-25 ENCOUNTER — Encounter: Payer: Self-pay | Admitting: *Deleted

## 2021-02-26 ENCOUNTER — Telehealth: Payer: Self-pay | Admitting: Internal Medicine

## 2021-02-26 NOTE — Telephone Encounter (Signed)
Pt reporting a Sharp pain in the Lower Right Side of her Back since Saturday that will not go away. Pt feeling weak unable to bend or stand for a long period of time.  Pt also having frequent urination without burning.  Please call back.

## 2021-02-26 NOTE — Telephone Encounter (Signed)
RTC, patient states she has experienced lower right sided back pain X 4 days with urinary frequency.  Appt offered to come into clinic this afternoon to r/o UTI, patient states she can't come in today.  Appt made for tomorrow at 1445 with Dr. Antoine Primas.  Patient instructed in pain becomes severe or if she develops N/V, or fever to present to ED and she verbalized understanding. SChaplin, RN,BSN

## 2021-02-27 ENCOUNTER — Ambulatory Visit (INDEPENDENT_AMBULATORY_CARE_PROVIDER_SITE_OTHER): Payer: Medicare Other | Admitting: Internal Medicine

## 2021-02-27 VITALS — BP 131/80 | HR 102 | Temp 98.3°F | Ht 63.0 in | Wt 242.2 lb

## 2021-02-27 DIAGNOSIS — M79604 Pain in right leg: Secondary | ICD-10-CM | POA: Diagnosis not present

## 2021-02-27 DIAGNOSIS — N12 Tubulo-interstitial nephritis, not specified as acute or chronic: Secondary | ICD-10-CM | POA: Insufficient documentation

## 2021-02-27 DIAGNOSIS — M545 Low back pain, unspecified: Secondary | ICD-10-CM | POA: Diagnosis not present

## 2021-02-27 DIAGNOSIS — J449 Chronic obstructive pulmonary disease, unspecified: Secondary | ICD-10-CM

## 2021-02-27 LAB — POCT URINALYSIS DIPSTICK
Bilirubin, UA: NEGATIVE
Blood, UA: NEGATIVE
Glucose, UA: NEGATIVE
Ketones, UA: NEGATIVE
Leukocytes, UA: NEGATIVE
Nitrite, UA: NEGATIVE
Protein, UA: POSITIVE — AB
Spec Grav, UA: 1.015 (ref 1.010–1.025)
Urobilinogen, UA: 0.2 E.U./dL
pH, UA: 7 (ref 5.0–8.0)

## 2021-02-27 MED ORDER — CEPHALEXIN 500 MG PO CAPS: 500.0000 mg | ORAL_CAPSULE | Freq: Three times a day (TID) | ORAL | 0 refills | Status: DC

## 2021-02-27 MED ORDER — PROAIR HFA 108 (90 BASE) MCG/ACT IN AERS
2.0000 | INHALATION_SPRAY | RESPIRATORY_TRACT | 3 refills | Status: DC | PRN
Start: 2021-02-27 — End: 2021-03-12

## 2021-02-27 MED ORDER — ACETAMINOPHEN 500 MG PO TABS: 1000.0000 mg | ORAL_TABLET | Freq: Four times a day (QID) | ORAL | 2 refills | Status: DC | PRN

## 2021-02-27 NOTE — Progress Notes (Signed)
CC: Pain in lower back  HPI:  [name] is a 65 y.o. who presents with lower back pain on the right side. She has been experiencing this back pain for the past week. Pain is exacerbated with bending forward. She describes the pain as 9/10. She admits to having chills, denies fever. She admits to urinary frequency, urgency, and incontinence. She admits to discomfort with urination. She admits to mild abdominal pain. Denies any vaginal itching, irritation, odor or discharge. She admits to nausea but denies vomiting. Patient states she was on amoxicillin 3 weeks ago for dental infection.  Past Medical History:  Diagnosis Date   Anemia    Anxiety    HX PANIC ATTACKS   Arthritis    "starting to; in my hands" (07/09/2015)   Asthma    Atrial fibrillation (HCC)    Atrial flutter, paroxysmal (HCC)    Bloated abdomen    CFS (chronic fatigue syndrome)    Chewing difficulty    Chronic asthma with acute exacerbation    "I have chronic asthma all the time; sometimes exacerbations" (07/09/2015)   Chronic lower back pain    COPD (chronic obstructive pulmonary disease) ([place])    Cyst of right kidney    "3 of them; dx'd in ~ 01/2015"   Dyspnea    GERD (gastroesophageal reflux disease)    Heart murmur    History of blood transfusion    "related to my brain surgery I think"   History of pulmonary embolism 07/09/2015   History of radiation therapy 04/09/20-04/22/20   Left Lung, Dr. Gery Pray   HIV antibody positive ([place])    HIV disease ([place])    Hyperlipidemia    Hypertension    Leg edema    Lipodystrophy    Mild CAD 2013   Multiple thyroid nodules    Osteopenia    Palpitations    Pneumonia 07/09/2015   Shingles    Sleep apnea    "never completed part 2 of study; never wore mask" (07/09/2015)   Vitamin B 12 deficiency    Vitamin D deficiency    Allergies: Augmentin- reaction:headache, nausea, dizziness Lisinopril- Reaction: coughing Ciprofloxacin: Hives   Review of  Systems:  Review of Systems  Constitutional:  Positive for chills. Negative for fever.  Respiratory:  Positive for shortness of breath.   Gastrointestinal:  Positive for abdominal pain and nausea. Negative for vomiting.  Genitourinary:  Positive for dysuria, flank pain, frequency and urgency.  Skin:  Negative for itching.  Neurological:  Negative for headaches.     Vitals:   02/27/21 1433  BP: 131/80  Pulse: (!) 102  Temp: 98.3 F (36.8 C)  TempSrc: Oral  SpO2: 92%  Weight: 242 lb 3.2 oz (109.9 kg)  Height: 5\' 3"  (1.6 m)   Physical Exam Constitutional:      Appearance: Normal appearance. She is obese.  HENT:     Head: Normocephalic and atraumatic.     Mouth/Throat:     Dentition: Abnormal dentition (missing teeth on top row center and bottom).  Cardiovascular:     Rate and Rhythm: Normal rate.  Pulmonary:     Breath sounds: Decreased air movement present. Decreased breath sounds present.  Abdominal:     General: Bowel sounds are normal.  Musculoskeletal:     Lumbar back: Tenderness (costovertebral angle tenderness on the right) present.  Neurological:     Mental Status: She is alert.  Psychiatric:        Attention and  Perception: Attention normal.        Speech: Speech normal.        Behavior: Behavior normal.     Assessment & Plan:   See Encounters Tab for problem based charting.  Patient seen with Dr. Jimmye Norman

## 2021-02-27 NOTE — Patient Instructions (Signed)
Pyelonephritis, Adult  Pyelonephritis is an infection that occurs in the kidney. The kidneys are organs that help clean the blood by moving waste out of the blood and into the pee (urine). This infection can happen quickly, or it can last for a long time. In mostcases, it clears up with treatment and does not cause other problems. What are the causes? This condition may be caused by: Germs (bacteria) going from the bladder up to the kidney. This may happen after having a bladder infection. Germs going from the blood to the kidney. What increases the risk? This condition is more likely to develop in: Pregnant women. Older people. People who have any of these conditions: Diabetes. Inflammation of the prostate gland (prostatitis), in males. Kidney stones or bladder stones. Other problems with the kidney or the parts of your body that carry pee from the kidneys to the bladder (ureters). Cancer. People who have a small, thin tube (catheter) placed in the bladder. People who are sexually active. Women who use a medicine that kills sperm (spermicide) to prevent pregnancy. People who have had a prior urinary tract infection (UTI). What are the signs or symptoms? Symptoms of this condition include: Peeing often. A strong urge to pee right away. Burning or stinging when peeing. Belly pain. Back pain. Pain in the side (flank area). Fever or chills. Blood in the pee, or dark pee. Feeling sick to your stomach (nauseous) or throwing up (vomiting). How is this treated? This condition may be treated by: Taking antibiotic medicines by mouth (orally). Drinking enough fluids. If the infection is bad, you may need to stay in the hospital. You may be givenantibiotics and fluids that are put directly into a vein through an IV tube. In some cases, other treatments may be needed. Follow these instructions at home: Medicines Take your antibiotic medicine as told by your doctor. Do not stop taking the  antibiotic even if you start to feel better. Take over-the-counter and prescription medicines only as told by your doctor. General instructions  Drink enough fluid to keep your pee pale yellow. Avoid caffeine, tea, and carbonated drinks. Pee (urinate) often. Avoid holding in pee for long periods of time. Pee before and after sex. After pooping (having a bowel movement), women should wipe from front to back. Use each tissue only once. Keep all follow-up visits as told by your doctor. This is important.  Contact a doctor if: You do not feel better after 2 days. Your symptoms get worse. You have a fever. Get help right away if: You cannot take your medicine or drink fluids as told. You have chills and shaking. You throw up. You have very bad pain in your side or back. You feel very weak or you pass out (faint). Summary Pyelonephritis is an infection that occurs in the kidney. In most cases, this infection clears up with treatment and does not cause other problems. Take your antibiotic medicine as told by your doctor. Do not stop taking the antibiotic even if you start to feel better. Drink enough fluid to keep your pee pale yellow. This information is not intended to replace advice given to you by your health care provider. Make sure you discuss any questions you have with your healthcare provider. Document Revised: 06/14/2018 Document Reviewed: 06/14/2018 Elsevier Patient Education  2022 [employer].

## 2021-02-27 NOTE — Assessment & Plan Note (Addendum)
Shannon [name] presents with urinary frequency, urgency and incontinence. She also experiences discomfort with urination. She has severe right flank pain on physical exam. She denies fever. Urine dipstick revealed negative leukocyte esterase and negative for nitrates. Patient has a history of acquired cyst of kidney High suspicion for possible cyst rupture; r/o pyelonephritis  PLAN: Urine sample collected Pending Urine culture Start patient on Cephalexin 500mg  TID for 10 days until results of culture Start on Tylenol 1000mg  as needed, 4000mg  max a day for pain   ADDENDUM: Urine dipstick- neg for leukocyte esterase and nitrates UA- neg for bacteria and casts I called patient to inform her to stop taking the prescribed antibiotics; infection has been ruled out

## 2021-02-28 ENCOUNTER — Other Ambulatory Visit: Payer: Medicare Other

## 2021-02-28 ENCOUNTER — Telehealth: Payer: Self-pay

## 2021-02-28 ENCOUNTER — Telehealth: Payer: Self-pay | Admitting: Internal Medicine

## 2021-02-28 ENCOUNTER — Other Ambulatory Visit: Payer: Self-pay

## 2021-02-28 ENCOUNTER — Ambulatory Visit: Payer: Medicare Other

## 2021-02-28 DIAGNOSIS — Z113 Encounter for screening for infections with a predominantly sexual mode of transmission: Secondary | ICD-10-CM

## 2021-02-28 DIAGNOSIS — N281 Cyst of kidney, acquired: Secondary | ICD-10-CM

## 2021-02-28 DIAGNOSIS — R109 Unspecified abdominal pain: Secondary | ICD-10-CM

## 2021-02-28 DIAGNOSIS — Z79899 Other long term (current) drug therapy: Secondary | ICD-10-CM

## 2021-02-28 DIAGNOSIS — B20 Human immunodeficiency virus [HIV] disease: Secondary | ICD-10-CM

## 2021-02-28 LAB — URINALYSIS, ROUTINE W REFLEX MICROSCOPIC
Bilirubin, UA: NEGATIVE
Glucose, UA: NEGATIVE
Leukocytes,UA: NEGATIVE
Nitrite, UA: NEGATIVE
RBC, UA: NEGATIVE
Specific Gravity, UA: 1.028 (ref 1.005–1.030)
Urobilinogen, Ur: 0.2 mg/dL (ref 0.2–1.0)
pH, UA: 7 (ref 5.0–7.5)

## 2021-02-28 LAB — MICROSCOPIC EXAMINATION
Bacteria, UA: NONE SEEN
Casts: NONE SEEN /lpf
RBC, Urine: NONE SEEN /hpf (ref 0–2)

## 2021-02-28 MED ORDER — ACETAMINOPHEN 500 MG PO TABS: 1000.0000 mg | ORAL_TABLET | Freq: Four times a day (QID) | ORAL | 2 refills | Status: DC | PRN

## 2021-02-28 NOTE — Chronic Care Management (AMB) (Signed)
Care Management    RN Visit Note  02/28/2021 Name: Shannon [name] MRN: [id number] DOB: 02/13/1956  Subjective: Shannon [name] is a 65 y.o. year old female who is a primary care patient of Shannon Falcon, MD. The care management team was consulted for assistance with disease management and care coordination needs.    Engaged with patient by telephone for follow up visit in response to provider referral for case management and/or care coordination services.   Consent to Services:   Shannon [name] was given information about Care Management services today including:  Care Management services includes personalized support from designated clinical staff supervised by her physician, including individualized plan of care and coordination with other care providers 24/7 contact phone numbers for assistance for urgent and routine care needs. The patient may stop case management services at any time by phone call to the office staff.  Patient agreed to services and consent obtained.    Assessment: Patient is currently experiencing difficulty with pyelonephritis.. See Care Plan below for interventions and patient self-care actives. Follow up Plan: Patient would like continued follow-up.  CCM RNCM will outreach the patient within the next 2-3 weeks.  Patient will call office if needed prior to next encounter  Review of patient past medical history, allergies, medications, health status, including review of consultants reports, laboratory and other test data, was performed as part of comprehensive evaluation and provision of chronic care management services.   SDOH (Social Determinants of Health) assessments and interventions performed:    Care Plan  Allergies  Allergen Reactions   Tree Extract Swelling and Other (See Comments)    Swelling to eyes   Augmentin [Amoxicillin-Pot Clavulanate] Other (See Comments)    Headache, dizzy   Lisinopril Cough    Face/throat swelling   Ciprofloxacin Hives     Outpatient Encounter Medications as of 02/28/2021  Medication Sig Note   acetaminophen (TYLENOL) 325 MG tablet Take 325 mg by mouth every 6 (six) hours as needed (FOR PAIN).    acetaminophen (TYLENOL) 500 MG tablet Take 2 tablets (1,000 mg total) by mouth every 6 (six) hours as needed.    albuterol (PROVENTIL) (2.5 MG/3ML) 0.083% nebulizer solution Take 3 mLs (2.5 mg total) by nebulization every 6 (six) hours as needed for wheezing or shortness of breath.    Ascorbic Acid (VITAMIN C) 1000 MG tablet Take 1,000 mg by mouth every other day.     b complex vitamins tablet Take 1 tablet by mouth daily.    BIKTARVY 50-200-25 MG TABS tablet TAKE 1 TABLET BY MOUTH DAILY    BIOTIN PO Take 1 tablet by mouth daily as needed (for supplementation).     calcium carbonate (OSCAL) 1500 (600 Ca) MG TABS tablet Take 600 mg of elemental calcium by mouth daily with breakfast.    cephALEXin (KEFLEX) 500 MG capsule Take 1 capsule (500 mg total) by mouth 3 (three) times daily for 10 days.    Cholecalciferol (SM VITAMIN D3) 100 MCG (4000 UT) CAPS Take 1 capsule (4,000 Units total) by mouth daily.    cloNIDine (CATAPRES) 0.1 MG tablet TAKE 1 TABLET(0.1 MG) BY MOUTH EVERY 12 HOURS    cyanocobalamin (,VITAMIN B-12,) 1000 MCG/ML injection ADMINISTER 1 ML(1000 MCG) IN THE MUSCLE EVERY 30 DAYS    cyclobenzaprine (FLEXERIL) 5 MG tablet Take 1 tablet (5 mg total) by mouth 3 (three) times daily as needed for muscle spasms. Start taking at night to see how they affect you.  diltiazem (CARDIZEM CD) 240 MG 24 hr capsule Take 1 capsule (240 mg total) by mouth daily.    fluticasone furoate-vilanterol (BREO ELLIPTA) 200-25 MCG/INH AEPB Inhale 1 puff into the lungs daily.    furosemide (LASIX) 20 MG tablet Take 20 mg by mouth every other day.    HYDROcodone-acetaminophen (NORCO/VICODIN) 5-325 MG tablet Take 1 tablet by mouth 2 (two) times daily as needed for severe pain.    MAGNESIUM PO Take 1 tablet by mouth every other day.      Melatonin 5 MG TABS Take 5 mg by mouth at bedtime as needed (for sleep).    Misc. Devices (PULSE OXIMETER FOR FINGER) MISC 1 Units by Does not apply route as needed.    Mouthwashes (BIOTENE DRY MOUTH GENTLE) LIQD Use as directed 1 Dose in the mouth or throat 2 (two) times daily as needed (dry mouth).    Omega-3 Fatty Acids (FISH OIL) 1000 MG CAPS Take 1,000 mg by mouth daily as needed (pt prefrence).    omeprazole (PRILOSEC) 40 MG capsule Take 1 capsule (40 mg total) by mouth daily as needed (heart burn).    OXYGEN Inhale 1.5-2 L into the lungs See admin instructions. Uses at bedtime and through the day as needed    PROAIR HFA 108 (90 Base) MCG/ACT inhaler Inhale 2 puffs into the lungs every 4 (four) hours as needed for wheezing or shortness of breath.    senna (SENOKOT) 8.6 MG tablet Take 1 tablet by mouth daily as needed for constipation.    spironolactone (ALDACTONE) 25 MG tablet TAKE 1 TABLET(25 MG) BY MOUTH DAILY    SYRINGE/NEEDLE, DISP, 1 ML (B-D SYRINGE/NEEDLE 1CC/25GX5/8) 25G X 5/8" 1 ML MISC 1 Units by Does not apply route every 30 (thirty) days.    vitamin A 3 MG (10000 UNITS) capsule Take 10,000 Units by mouth daily.    XARELTO 20 MG TABS tablet TAKE 1 TABLET(20 MG) BY MOUTH DAILY WITH SUPPER (Patient taking differently: Take 20 mg by mouth daily.) 08/20/2020: Unk time   zinc gluconate 50 MG tablet Take 50 mg by mouth daily.    No facility-administered encounter medications on file as of 02/28/2021.    Patient Active Problem List   Diagnosis Date Noted   Pyelonephritis 02/27/2021   Panniculitis 01/23/2021   Healthcare maintenance 01/23/2021   On supplemental oxygen therapy 11/19/2020   Insomnia 11/14/2020   Burn erythema of breast, initial encounter 11/05/2020   Acute back pain 09/17/2020   Shoulder blade pain 08/09/2020   Skin ulcer ([place]) 06/24/2020   Bronchitis 03/07/2020   Pulmonary nodule 02/14/2020   Nodule of upper lobe of left lung 02/06/2020   Thyroid nodule 01/26/2020    Adenocarcinoma of lung ([place]) 01/04/2020   Chronic obstructive asthma ([place]) 12/29/2019   Myalgia 10/24/2019   (HFpEF) heart failure with preserved ejection fraction ([place]) 02/07/2019   Leg swelling 11/03/2018   Other fatigue 06/30/2018   Shortness of breath on exertion 06/30/2018   Hyperglycemia 06/30/2018   Vitamin D deficiency 06/30/2018   Chronic anticoagulation 01/03/2018   Chronic respiratory failure with hypoxia ([place]) 11/30/2017   Constipation 11/03/2017   Atrial flutter ([place]) 03/11/2017   CAD (coronary artery disease) 01/28/2017   Obesity (BMI 30-39.9) 01/13/2017   Ascending aorta dilatation ([place]) 01/07/2017   Low back pain radiating to right lower extremity 12/02/2016   Acquired cyst of kidney 12/02/2016   Chronic kidney disease (CKD), stage III (moderate) (HCC) 12/01/2016   Generalized anxiety disorder 10/22/2016   Hypersomnia  10/01/2016   Accessory skin tags 06/10/2016   Nocturnal hypoxemia 05/13/2016   Cervical radiculopathy 04/14/2016   GERD (gastroesophageal reflux disease) 01/02/2016   Morbid (severe) obesity due to excess calories ([place]) 10/10/2015   Vitamin B12 deficiency 10/02/2015   Moderate persistent asthma with exacerbation 07/09/2015   HIV disease ([place]) 07/09/2015   Uncontrolled hypertension 07/09/2015    Conditions to be addressed/monitored: CAD, HTN, and COPD  Care Plan : CCM RN- Cancer Treatment Phase (Adult)- patient diagnosed with lung cancer June 2021  Updates made by [name], RN since 02/28/2021 12:00 AM     Problem: Patient lives alone with no family nearby with recent  diagnosis of lung cancer ( June 2021) with numerous chronic disease states   Priority: High  Onset Date: 08/28/2020     Goal: Disease Progression Minimized or Managed   Start Date: 08/28/2020  Expected End Date: 05/23/2021  Recent Progress: On track  Priority: High  Note:   Current Barriers: Spoke with patient today, she was in the clinic on 03/01/21 and was diagnosed with  Pyelonephritis and she is feeling poorly today.  Patient did not want to speak at this time and asked this RNCM to call her back in a few weeks when she was able to feel better. Chronic Disease Management support and education needs related to [facility], CAD, HIV,  lung Ca diagnosed June 2021 Unable to independently manage chronic disease states- Nurse Case Manager Clinical Goal(s):  Over the next 30-60 days, patient will attend all scheduled medical appointments: related to chronic disease states Over the next 30-60  days, the patient will demonstrate ongoing self health care management ability as evidenced by no unplanned hospital admissions   Interventions:  1:1 collaboration with Shannon Falcon, MD regarding development and update of comprehensive plan of care as evidenced by provider attestation and co-signature Inter-disciplinary care team collaboration (see longitudinal plan of care) Evaluation of current treatment plan related to chronic disease states and patient's adherence to plan as established by provider. Assessed medication taking behavior Discussed plans with patient for ongoing care management follow up and provided patient with direct contact information for care management team  Patient Goals/Self-Care Activities Over the next 30-60 days, patient will:  - Patient will self administer medications as prescribed Patient will attend all scheduled provider appointments Patient will call pharmacy for medication refills Patient will continue to perform ADL's independently Patient will continue to perform IADL's independently Patient will call provider office for new concerns or questions - eliminate COPD/Asthma symptom triggers at home - follow rescue plan if symptoms flare-up - keep follow-up appointments - use an extra pillow to sleep - arrange a ride through an agency 1 week before appointment - ask family or friend for a ride - call to cancel if needed -  keep a calendar with appointment dates - eat healthy - get a least 8 hours of sleep at night - use devices that will help like a cane, sock-puller or reacher - use meditation or relaxation techniques - wash my hands with mild soap and water - wash my hands after using the bathroom - wash my hands after coughing, sneezing or blowing nose - stay away from people who are sick - begin personal counseling on 09/05/20 - talk about feelings with a friend, family or spiritual advisor - practice positive thinking and self-talk  Follow Up Plan: The care management team will reach out to the patient again over the next 14-21 days.  Plan: The care management team will reach out to the patient again over the next 14-21 days.  [name], RN, BSN, CCM Care Management Coordinator [facility] Phone: [phone] / Fax: [phone]

## 2021-02-28 NOTE — Telephone Encounter (Signed)
Patient calling to report she can not do labs today. Also asked about seeing Dr Baxter Flattery for back pain. Advised to contact Internal Medicine. States she saw them and there was no infection in urine but they did not offer plan for the pain in lower back. Reports she has cysts in kidneys but has not seen nephrologist. She will call PCP back to set that up.  Will come on 03/06/21 for labs.

## 2021-02-28 NOTE — Telephone Encounter (Signed)
Dr. Jimmye Norman, attending who assisted Dr. Jeanice Lim will be reaching out to the patient. Thank you

## 2021-02-28 NOTE — Addendum Note (Signed)
Addended by: Riesa Pope on: 02/28/2021 11:54 AM   Modules accepted: Orders

## 2021-02-28 NOTE — Telephone Encounter (Signed)
Patient is calling in requesting to speak with  Dr. Jeanice Lim or her nurse.  Would like a call back to discuss being referred to kidney specialist.  If unable to reach on cell phone please try home phone number.

## 2021-02-28 NOTE — Telephone Encounter (Signed)
Shannon [name] was seen yesterday in clinic for complaint of severe right back pain, dysuria, frequency, and an episode of urinary incontinence.  She had significant right flank tenderness on physical exam.  Empiric treatment for pyelonephritis was begun, though urinalysis is negative.  Shannon [name] had called the clinic today requesting a referral to a kidney doctor.  I returned the phone call and she explained that she is still having significant pain (no better, no worse), still no fever.  She has had multiple medical conditions and is quite concerned.  She has a history of renal cysts-MRI from 2 years ago reviewed.  Given her unexplained significant pain, will reimage with MRI to investigate kidney and surrounding areas.  She has been instructed to stop her antibiotic and is in agreement with this plan.

## 2021-02-28 NOTE — Patient Instructions (Signed)
Visit Information   Goals Addressed             This Visit's Progress    Make and Keep All Appointments       Timeframe:  Long-Range Goal Priority:  High Start Date:         08/28/20                    Expected End Date:       05/23/21                Follow Up Date 05/18/21   - arrange a ride through an agency 1 week before appointment - ask family or friend for a ride - call to cancel if needed - keep a calendar with appointment dates    Why is this important?   Part of staying healthy is seeing the doctor for follow-up care.  If you forget your appointments, there are some things you can do to stay on track.    Notes: meeting goal      Manage Fatigue (Tiredness- Cancer Treatment)- patient diagnosed with lung cancer June 2021       Follow Up Date 05/23/21   - eat healthy - get a least 8 hours of sleep at night - use devices that will help like a cane, sock-puller or reacher - use meditation or relaxation techniques    Why is this important?   Cancer treatment and its side effects can drain your energy. It can keep you from doing things you would like to do.  There are many things that you can do to manage fatigue.    Notes: 02/28/21- Patient was diagnoses with Pyelonephritis at [facility] on 02/27/21      Track and Manage My Symptoms-COPD/asthma       Timeframe:  Long-Range Goal Priority:  High Start Date:              08/28/20               Expected End Date:           ongoing           Follow Up Date 05/23/21   - eliminate symptom triggers at home - follow rescue plan if symptoms flare-up - keep follow-up appointments - use an extra pillow to sleep    Why is this important?   Tracking your symptoms and other information about your health helps your doctor plan your care.  Write down the symptoms, the time of day, what you were doing and what medicine you are taking.  You will soon learn how to manage your symptoms.     Notes: no recent asthma/COPD exacerbations per  patient, says she uses O2 at hs and during the day prn         The patient verbalized understanding of instructions, educational materials, and care plan provided today and declined offer to receive copy of patient instructions, educational materials, and care plan.   Telephone follow up appointment with care management team member scheduled for: 2-3 weeks  [name], RN, BSN, CCM Care Management Coordinator [facility] Internal Medicine Phone: [phone] / Fax: [phone]

## 2021-03-02 LAB — URINE CULTURE: Organism ID, Bacteria: NO GROWTH

## 2021-03-04 NOTE — Progress Notes (Signed)
Internal Medicine Clinic Attending  I saw and evaluated the patient.  I personally confirmed the key portions of the history and exam documented by Dr.  Jeanice Lim  and I reviewed pertinent patient test results.  The assessment, diagnosis, and plan were formulated together and I agree with the documentation in the resident's note. We were surprised to learn that  her symptoms were not explained by pyelonephritis. MRI has been ordered to further evaluate the R flank pain (she has renal cysts, last imaged a couple of years ago with MRI).

## 2021-03-05 DIAGNOSIS — J449 Chronic obstructive pulmonary disease, unspecified: Secondary | ICD-10-CM | POA: Diagnosis not present

## 2021-03-06 ENCOUNTER — Ambulatory Visit
Admission: RE | Admit: 2021-03-06 | Discharge: 2021-03-06 | Disposition: A | Discharge status: Home / Self Care | Payer: Medicare Other | Source: Ambulatory Visit | Attending: Radiation Oncology | Admitting: Radiation Oncology

## 2021-03-06 ENCOUNTER — Other Ambulatory Visit: Payer: Medicare Other

## 2021-03-08 ENCOUNTER — Other Ambulatory Visit: Payer: Self-pay

## 2021-03-08 ENCOUNTER — Other Ambulatory Visit: Payer: Self-pay | Admitting: Internal Medicine

## 2021-03-08 ENCOUNTER — Ambulatory Visit (HOSPITAL_COMMUNITY)
Admission: RE | Admit: 2021-03-08 | Discharge: 2021-03-08 | Disposition: A | Discharge status: Home / Self Care | Payer: Medicare Other | Source: Ambulatory Visit | Attending: Internal Medicine | Admitting: Internal Medicine

## 2021-03-08 DIAGNOSIS — K5730 Diverticulosis of large intestine without perforation or abscess without bleeding: Secondary | ICD-10-CM | POA: Diagnosis not present

## 2021-03-08 DIAGNOSIS — R109 Unspecified abdominal pain: Secondary | ICD-10-CM

## 2021-03-08 DIAGNOSIS — N281 Cyst of kidney, acquired: Secondary | ICD-10-CM | POA: Insufficient documentation

## 2021-03-08 DIAGNOSIS — Z85118 Personal history of other malignant neoplasm of bronchus and lung: Secondary | ICD-10-CM | POA: Diagnosis not present

## 2021-03-08 DIAGNOSIS — I700 Atherosclerosis of aorta: Secondary | ICD-10-CM | POA: Diagnosis not present

## 2021-03-10 ENCOUNTER — Telehealth: Payer: Self-pay

## 2021-03-10 NOTE — Telephone Encounter (Signed)
Patient contacted [facility] with multiple concerns. Patient reports significant back pain which she attributes to her kidney cysts. In addition to that pain, she reports dental pain. She has an appointment on 7/28 with the dentist and Dr Baxter Flattery however she doesn't believe she can wait in the waiting room long in between the appointments due to the significant pain. Patient is being seen by her PCP for the back pain and RN suggested reaching out to them for an earlier appointment and for pain management as our providers don't prescribe pain medications. PCP has also placed referral to nephrology. She will contact the dentist to see about a later appointment closer to her time with [name] as she doesn't have any earlier availability.   Patient was frustrated with the inability to be seen sooner. RN explained that our office is only managing her HIV and her PCP is responsible for everything else. Reiterated that they are better equipped to manage her pain and other concerns. Patient verbalized understanding and stated she'd follow up   Carlean Purl, RN

## 2021-03-11 ENCOUNTER — Other Ambulatory Visit: Payer: Self-pay | Admitting: Internal Medicine

## 2021-03-11 ENCOUNTER — Telehealth: Payer: Self-pay | Admitting: Internal Medicine

## 2021-03-11 DIAGNOSIS — S2231XA Fracture of one rib, right side, initial encounter for closed fracture: Secondary | ICD-10-CM

## 2021-03-11 MED ORDER — OXYCODONE-ACETAMINOPHEN 5-325 MG PO TABS: 1.0000 | ORAL_TABLET | ORAL | 0 refills | Status: DC | PRN

## 2021-03-11 NOTE — Telephone Encounter (Signed)
Called to inform Shannon [name] of her MRI results which were finalized yesterday.  Rib fracture medial R 10th rib corresponds to location of pain and tenderness.  She was shocked to hear this, as she has had no trauma. Experiencing pain with breathing, coughing, moving; difficult to sleep.  Taking NSAID and tylenol w/o relief.  Will prescribe Percocet; she agrees.  F/u scheduled tomorrow in [facility] clinic 9:30 am with Dr. Jeanice Lim.

## 2021-03-12 ENCOUNTER — Other Ambulatory Visit: Payer: Self-pay

## 2021-03-12 ENCOUNTER — Ambulatory Visit (INDEPENDENT_AMBULATORY_CARE_PROVIDER_SITE_OTHER): Payer: Medicare Other | Admitting: Internal Medicine

## 2021-03-12 ENCOUNTER — Encounter: Payer: Self-pay | Admitting: Internal Medicine

## 2021-03-12 VITALS — BP 164/92 | HR 91 | Temp 98.6°F | Ht 63.5 in | Wt 238.2 lb

## 2021-03-12 DIAGNOSIS — M8000XD Age-related osteoporosis with current pathological fracture, unspecified site, subsequent encounter for fracture with routine healing: Secondary | ICD-10-CM

## 2021-03-12 DIAGNOSIS — S2231XD Fracture of one rib, right side, subsequent encounter for fracture with routine healing: Secondary | ICD-10-CM | POA: Diagnosis not present

## 2021-03-12 DIAGNOSIS — I2510 Atherosclerotic heart disease of native coronary artery without angina pectoris: Secondary | ICD-10-CM | POA: Diagnosis not present

## 2021-03-12 DIAGNOSIS — Z1211 Encounter for screening for malignant neoplasm of colon: Secondary | ICD-10-CM

## 2021-03-12 DIAGNOSIS — S2239XA Fracture of one rib, unspecified side, initial encounter for closed fracture: Secondary | ICD-10-CM | POA: Insufficient documentation

## 2021-03-12 DIAGNOSIS — M810 Age-related osteoporosis without current pathological fracture: Secondary | ICD-10-CM | POA: Insufficient documentation

## 2021-03-12 DIAGNOSIS — J449 Chronic obstructive pulmonary disease, unspecified: Secondary | ICD-10-CM

## 2021-03-12 DIAGNOSIS — E538 Deficiency of other specified B group vitamins: Secondary | ICD-10-CM

## 2021-03-12 DIAGNOSIS — I10 Essential (primary) hypertension: Secondary | ICD-10-CM | POA: Diagnosis not present

## 2021-03-12 DIAGNOSIS — E559 Vitamin D deficiency, unspecified: Secondary | ICD-10-CM | POA: Diagnosis not present

## 2021-03-12 DIAGNOSIS — E785 Hyperlipidemia, unspecified: Secondary | ICD-10-CM | POA: Diagnosis not present

## 2021-03-12 DIAGNOSIS — J4489 Other specified chronic obstructive pulmonary disease: Secondary | ICD-10-CM

## 2021-03-12 MED ORDER — CYANOCOBALAMIN 1000 MCG/ML IJ SOLN: INTRAMUSCULAR | 1 refills | Status: DC

## 2021-03-12 MED ORDER — PROAIR HFA 108 (90 BASE) MCG/ACT IN AERS: 2.0000 | INHALATION_SPRAY | RESPIRATORY_TRACT | 3 refills | Status: DC | PRN

## 2021-03-12 MED ORDER — RIVAROXABAN 20 MG PO TABS: 20.0000 mg | ORAL_TABLET | Freq: Every day | ORAL | 0 refills | Status: DC

## 2021-03-12 NOTE — Progress Notes (Signed)
CC: Follow-up for severe right flank pain  HPI:  Shannon [name] is a 65 y.o. female with a past medical history stated below and presents today for follow-up for severe right flank pain.  Shannon [name] presents today with complaints of continuous right flank pain.  Pain is unchanged from previous visit.  She states that deep breaths exacerbate the pain.  On previous visit, UTI infection was suspected.  Urine dipstick, UA, urine culture all negative. MRI of the abdomen without contrast completed on 03/10/2021 revealed closed 10th rib fracture. She denies any recent fall or trauma to the back. Dr. Jimmye Norman called Shannon [name] with the results and wrote a prescription for oxycodone-acetaminophen 5 325 mg for pain relief.  Shannon [name] reports that she will pick up the prescription today.    Please see problem based assessment and plan for additional details.  Past Medical History:  Diagnosis Date   Anemia    Anxiety    HX PANIC ATTACKS   Arthritis    "starting to; in my hands" (07/09/2015)   Asthma    Atrial fibrillation (HCC)    Atrial flutter, paroxysmal (HCC)    Bloated abdomen    CFS (chronic fatigue syndrome)    Chewing difficulty    Chronic asthma with acute exacerbation    "I have chronic asthma all the time; sometimes exacerbations" (07/09/2015)   Chronic lower back pain    COPD (chronic obstructive pulmonary disease) ([place])    Cyst of right kidney    "3 of them; dx'd in ~ 01/2015"   Dyspnea    GERD (gastroesophageal reflux disease)    Heart murmur    History of blood transfusion    "related to my brain surgery I think"   History of pulmonary embolism 07/09/2015   History of radiation therapy 04/09/20-04/22/20   Left Lung, Dr. Gery Pray   HIV antibody positive ([place])    HIV disease ([place])    Hyperlipidemia    Hypertension    Leg edema    Lipodystrophy    Mild CAD 2013   Multiple thyroid nodules    Osteopenia    Palpitations    Pneumonia 07/09/2015   Shingles    Sleep  apnea    "never completed part 2 of study; never wore mask" (07/09/2015)   Vitamin B 12 deficiency    Vitamin D deficiency     Current Outpatient Medications on File Prior to Visit  Medication Sig Dispense Refill   acetaminophen (TYLENOL) 500 MG tablet Take 2 tablets (1,000 mg total) by mouth every 6 (six) hours as needed. 100 tablet 2   Ascorbic Acid (VITAMIN C) 1000 MG tablet Take 1,000 mg by mouth every other day.      b complex vitamins tablet Take 1 tablet by mouth daily.     BIKTARVY 50-200-25 MG TABS tablet TAKE 1 TABLET BY MOUTH DAILY 30 tablet 5   BIOTIN PO Take 1 tablet by mouth daily as needed (for supplementation).      calcium carbonate (OSCAL) 1500 (600 Ca) MG TABS tablet Take 600 mg of elemental calcium by mouth daily with breakfast.     Cholecalciferol (SM VITAMIN D3) 100 MCG (4000 UT) CAPS Take 1 capsule (4,000 Units total) by mouth daily. 30 capsule 0   cloNIDine (CATAPRES) 0.1 MG tablet TAKE 1 TABLET(0.1 MG) BY MOUTH EVERY 12 HOURS 180 tablet 2   cyclobenzaprine (FLEXERIL) 5 MG tablet Take 1 tablet (5 mg total) by mouth 3 (three) times daily  as needed for muscle spasms. Start taking at night to see how they affect you. 30 tablet 2   diltiazem (CARDIZEM CD) 240 MG 24 hr capsule Take 1 capsule (240 mg total) by mouth daily. 90 capsule 3   fluticasone furoate-vilanterol (BREO ELLIPTA) 200-25 MCG/INH AEPB Inhale 1 puff into the lungs daily. 1 each 5   furosemide (LASIX) 20 MG tablet Take 20 mg by mouth every other day.     MAGNESIUM PO Take 1 tablet by mouth every other day.      Melatonin 5 MG TABS Take 5 mg by mouth at bedtime as needed (for sleep).     Misc. Devices (PULSE OXIMETER FOR FINGER) MISC 1 Units by Does not apply route as needed. 1 each 0   Mouthwashes (BIOTENE DRY MOUTH GENTLE) LIQD Use as directed 1 Dose in the mouth or throat 2 (two) times daily as needed (dry mouth).     Omega-3 Fatty Acids (FISH OIL) 1000 MG CAPS Take 1,000 mg by mouth daily as needed (pt  prefrence).     omeprazole (PRILOSEC) 40 MG capsule Take 1 capsule (40 mg total) by mouth daily as needed (heart burn).     oxyCODONE-acetaminophen (PERCOCET/ROXICET) 5-325 MG tablet Take 1 tablet by mouth every 4 (four) hours as needed for severe pain. 25 tablet 0   OXYGEN Inhale 1.5-2 L into the lungs See admin instructions. Uses at bedtime and through the day as needed     senna (SENOKOT) 8.6 MG tablet Take 1 tablet by mouth daily as needed for constipation.     spironolactone (ALDACTONE) 25 MG tablet TAKE 1 TABLET(25 MG) BY MOUTH DAILY 90 tablet 1   SYRINGE/NEEDLE, DISP, 1 ML (B-D SYRINGE/NEEDLE 1CC/25GX5/8) 25G X 5/8" 1 ML MISC 1 Units by Does not apply route every 30 (thirty) days. 50 each 0   vitamin A 3 MG (10000 UNITS) capsule Take 10,000 Units by mouth daily.     zinc gluconate 50 MG tablet Take 50 mg by mouth daily.     No current facility-administered medications on file prior to visit.    Family History  Problem Relation Age of Onset   Asthma Mother    Heart failure Mother        cardiomyopathy   Thyroid disease Mother    Heart disease Mother    Obesity Mother    Hypertension Mother    Heart murmur Sister    Heart murmur Brother    Diabetes Sister    Thyroid disease Sister    Heart murmur Sister     Social History   Socioeconomic History   Marital status: Single    Spouse name: Not on file   Number of children: Not on file   Years of education: Not on file   Highest education level: Not on file  Occupational History   Occupation: student  Tobacco Use   Smoking status: Never   Smokeless tobacco: Never  Vaping Use   Vaping Use: Never used  Substance and Sexual Activity   Alcohol use: Not Currently    Alcohol/week: 0.0 standard drinks    Comment: Rarely.   Drug use: No   Sexual activity: Not Currently    Partners: Male    Comment: declined condoms  Other Topics Concern   Not on file  Social History Narrative   Originally from [place] then [place] now [place] 2016  approx   Disabled    Rare alcohol, never smoker, no drug use  Social Determinants of Health   Financial Resource Strain: Not on file  Food Insecurity: Not on file  Transportation Needs: Not on file  Physical Activity: Not on file  Stress: Not on file  Social Connections: Not on file  Intimate Partner Violence: Not on file    Review of Systems  Constitutional:  Negative for chills and fever.  Eyes:  Negative for blurred vision.  Respiratory:  Positive for shortness of breath. Negative for wheezing.   Cardiovascular:  Negative for chest pain and palpitations.  Gastrointestinal:  Negative for abdominal pain, constipation, diarrhea, heartburn, nausea and vomiting.  Genitourinary:  Positive for flank pain (right).  Musculoskeletal:  Negative for falls.  Neurological:  Negative for dizziness, loss of consciousness and headaches.    Vitals:   03/12/21 0906  BP: (!) 164/92  Pulse: 91  Temp: 98.6 F (37 C)  TempSrc: Oral  SpO2: 90%  Weight: 238 lb 3.2 oz (108 kg)  Height: 5' 3.5" (1.613 m)     Physical Exam Constitutional:      Appearance: Normal appearance.  HENT:     Head: Normocephalic and atraumatic.  Cardiovascular:     Rate and Rhythm: Normal rate and regular rhythm.     Heart sounds: Normal heart sounds, S1 normal and S2 normal.  Pulmonary:     Effort: Pulmonary effort is normal.     Breath sounds: Normal breath sounds and air entry.  Abdominal:     Palpations: Abdomen is soft.     Tenderness: There is no abdominal tenderness.  Musculoskeletal:     Lumbar back: Tenderness present.  Skin:    General: Skin is warm and dry.  Neurological:     General: No focal deficit present.     Mental Status: She is alert.  Psychiatric:        Attention and Perception: Attention normal.        Mood and Affect: Mood normal.        Speech: Speech normal.        Behavior: Behavior normal. Behavior is cooperative.     Assessment & Plan:   See Encounters Tab  for problem based charting.  Patient seen with Dr. Blenda Bridegroom, M.D. [facility], PGY-1 Pager: [phone], Phone: [phone] Date 03/12/2021 Time 1:06 PM

## 2021-03-12 NOTE — Assessment & Plan Note (Addendum)
Shannon [name] is on Xarelto 20 mg daily.  She follows a cardiologist for cardiovascular disease maintenance.  PLAN: Lipid profile ordered today.

## 2021-03-12 NOTE — Assessment & Plan Note (Addendum)
Ms. Kopper had a DEXA scan on December 08, 2020.  Results show osteopenia of -2.0.  Today she presents with an atraumatic bone fracture of the right 10th rib.   PLAN: CT scan of the chest ordered. Vitamin D 25-hydroxy levels ordered. CMP ordered. Follow-up in 1 month.

## 2021-03-12 NOTE — Patient Instructions (Addendum)
Use ice packs on right lower back.  2.  Use waist wrap to stabilize waist.  3.  Pain prescription medicine sent to pharmacy.  4. CT scan of chest ordered.

## 2021-03-12 NOTE — Assessment & Plan Note (Addendum)
Shannon [name] reports right flank pain for the past month.  February 27, 2021 she presented with right flank pain and UTI was suspected.  UA, dipstick, urine culture came back negative at that time.  MRI imaging of the abdomen revealed 10th rib fracture.  She denies any recent fall or trauma to the back.  Shannon [name] was prescribed oxycodone-acetaminophen 5-325 mg for pain relief.  DEXA scan conducted on November 28, 2020 revealed score of -2.0 osteopenia.  PLAN; Recommended to use ice pack on right side, waist wrap for stabilization and pain management with prescription. Vitamin D 25 hydroxy levels ordered today CT scan of the chest ordered today to assess for any mass-effect or subsequent injuries. Follow-up in 1 month.

## 2021-03-13 LAB — CMP14 + ANION GAP
ALT: 15 IU/L (ref 0–32)
AST: 18 IU/L (ref 0–40)
Albumin/Globulin Ratio: 1.8 (ref 1.2–2.2)
Albumin: 4.2 g/dL (ref 3.8–4.8)
Alkaline Phosphatase: 98 IU/L (ref 44–121)
Anion Gap: 17 mmol/L (ref 10.0–18.0)
BUN/Creatinine Ratio: 15 (ref 12–28)
BUN: 22 mg/dL (ref 8–27)
Bilirubin Total: 0.2 mg/dL (ref 0.0–1.2)
CO2: 28 mmol/L (ref 20–29)
Calcium: 10.4 mg/dL — ABNORMAL HIGH (ref 8.7–10.3)
Chloride: 100 mmol/L (ref 96–106)
Creatinine, Ser: 1.44 mg/dL — ABNORMAL HIGH (ref 0.57–1.00)
Globulin, Total: 2.4 g/dL (ref 1.5–4.5)
Glucose: 135 mg/dL — ABNORMAL HIGH (ref 65–99)
Potassium: 4.5 mmol/L (ref 3.5–5.2)
Sodium: 145 mmol/L — ABNORMAL HIGH (ref 134–144)
Total Protein: 6.6 g/dL (ref 6.0–8.5)
eGFR: 40 mL/min/{1.73_m2} — ABNORMAL LOW (ref >59)

## 2021-03-13 LAB — LIPID PANEL
Chol/HDL Ratio: 3.3 ratio (ref 0.0–4.4)
Cholesterol, Total: 227 mg/dL — ABNORMAL HIGH (ref 100–199)
HDL: 68 mg/dL (ref >39)
LDL Chol Calc (NIH): 150 mg/dL — ABNORMAL HIGH (ref 0–99)
Triglycerides: 55 mg/dL (ref 0–149)
VLDL Cholesterol Cal: 9 mg/dL (ref 5–40)

## 2021-03-13 LAB — VITAMIN D 25 HYDROXY (VIT D DEFICIENCY, FRACTURES): Vit D, 25-Hydroxy: 56 ng/mL (ref 30.0–100.0)

## 2021-03-13 LAB — FECAL OCCULT BLOOD, IMMUNOCHEMICAL: Fecal Occult Bld: NEGATIVE

## 2021-03-17 NOTE — Progress Notes (Signed)
Internal Medicine Clinic Attending  I saw and evaluated the patient.  I personally confirmed the key portions of the history and exam documented by Dr. Jeanice Lim and I reviewed pertinent patient test results.  The assessment, diagnosis, and plan were formulated together and I agree with the documentation in the resident's note.  Patient with non traumatic R rib fracture in the setting of a history of prior lung cancer. We will obtain a CT with contrast to rule out pathological etiology of fracture including possible lung cancer recurrence

## 2021-03-19 ENCOUNTER — Telehealth: Payer: Self-pay

## 2021-03-19 ENCOUNTER — Telehealth: Payer: Medicare Other

## 2021-03-19 NOTE — Telephone Encounter (Signed)
  Care Management   Outreach Note  03/19/2021 Name: Shannon [name] MRN: [id number] DOB: [birth year]  Referred by: [name], MD Reason for referral : Chronic Care Management (HTN, CAD, COPD)   An unsuccessful telephone outreach was attempted today. The patient was referred to the case management team for assistance with care management and care coordination.   Follow Up Plan: If patient returns call to provider office, please advise to call [facility] at [phone].  [name], RN, BSN, CCM Care Management Coordinator [facility] Internal Medicine Phone: [phone] / Fax: [phone]

## 2021-03-20 ENCOUNTER — Telehealth (INDEPENDENT_AMBULATORY_CARE_PROVIDER_SITE_OTHER): Payer: Medicare Other | Admitting: Internal Medicine

## 2021-03-20 ENCOUNTER — Encounter: Payer: Self-pay | Admitting: Internal Medicine

## 2021-03-20 ENCOUNTER — Other Ambulatory Visit: Payer: Self-pay

## 2021-03-20 DIAGNOSIS — R918 Other nonspecific abnormal finding of lung field: Secondary | ICD-10-CM

## 2021-03-20 DIAGNOSIS — N183 Chronic kidney disease, stage 3 unspecified: Secondary | ICD-10-CM | POA: Diagnosis not present

## 2021-03-20 DIAGNOSIS — B20 Human immunodeficiency virus [HIV] disease: Secondary | ICD-10-CM

## 2021-03-20 DIAGNOSIS — Z79899 Other long term (current) drug therapy: Secondary | ICD-10-CM

## 2021-03-20 DIAGNOSIS — R269 Unspecified abnormalities of gait and mobility: Secondary | ICD-10-CM | POA: Diagnosis not present

## 2021-03-20 DIAGNOSIS — J45909 Unspecified asthma, uncomplicated: Secondary | ICD-10-CM | POA: Diagnosis not present

## 2021-03-20 DIAGNOSIS — R911 Solitary pulmonary nodule: Secondary | ICD-10-CM | POA: Diagnosis not present

## 2021-03-20 DIAGNOSIS — J449 Chronic obstructive pulmonary disease, unspecified: Secondary | ICD-10-CM | POA: Diagnosis not present

## 2021-03-20 NOTE — Progress Notes (Signed)
Virtual Visit via Telephone Note  I connected with Shannon [name] on 03/20/21 at  3:30 PM EDT by telephone and verified that I am speaking with the correct person using two identifiers.  Location: Patient: at home Provider: in clinic   I discussed the limitations, risks, security and privacy concerns of performing an evaluation and management service by telephone and the availability of in person appointments. I also discussed with the patient that there may be a patient responsible charge related to this service. The patient expressed understanding and agreed to proceed.   History of Present Illness:   has had removal of 2 teeth Has had back/flank pain -- found to have spontaneous 10th rib fracture.[name] DEXA scan - osteopenia / has had vigorous coughing earlier in the year Lung cancer in remission Also had recent mri of abd - which showed known polycystic kidney disease  Observations/Objective: Fluent speech, answering questions accordingly  Assessment and Plan:  Hiv disease= cd 4 count of 449/VL<20 in feb. Undetectable. Back/flank pain = due to 10th rib pain - now on pain meds through IM physician Hx of lung cancer = has upcoming serial imaging to ensure no recurrence Osteopenia/porosis = currently on vitamin d/calcium  Follow Up Instructions:    I discussed the assessment and treatment plan with the patient. The patient was provided an opportunity to ask questions and all were answered. The patient agreed with the plan and demonstrated an understanding of the instructions.   The patient was advised to call back or seek an in-person evaluation if the symptoms worsen or if the condition fails to improve as anticipated.  I provided 10 minutes of non-face-to-face time during this encounter.   [name], MD

## 2021-03-24 ENCOUNTER — Telehealth: Payer: Self-pay

## 2021-03-24 NOTE — Telephone Encounter (Signed)
Requesting a letter to go back to school, please call pt back.

## 2021-03-24 NOTE — Telephone Encounter (Signed)
Please see phone note from 01/01/21.

## 2021-03-25 ENCOUNTER — Telehealth: Payer: Self-pay | Admitting: *Deleted

## 2021-03-25 ENCOUNTER — Ambulatory Visit: Payer: Medicare Other

## 2021-03-25 ENCOUNTER — Encounter: Payer: Self-pay | Admitting: Radiology

## 2021-03-25 NOTE — Chronic Care Management (AMB) (Addendum)
Care Management    RN Visit Note  03/25/2021 Name: Shannon [name] MRN: [id number] DOB: Oct 14, 1955  Subjective: Shannon [name] is a 65 y.o. year old female who is a primary care patient of [name], MD. The care management team was consulted for assistance with disease management and care coordination needs.    Engaged with patient by telephone for follow up visit in response to provider referral for case management and/or care coordination services.   Consent to Services:   [name] was given information about Care Management services today including:  Care Management services includes personalized support from designated clinical staff supervised by her physician, including individualized plan of care and coordination with other care providers 24/7 contact phone numbers for assistance for urgent and routine care needs. The patient may stop case management services at any time by phone call to the office staff.  Patient agreed to services and consent obtained.    Assessment: Patient is currently experiencing difficulty with bilateral lower extremity edema. See Care Plan below for interventions and patient self-care actives. Follow up Plan: Patient would like continued follow-up.  CCM RNCM will outreach the patient within the next 30 days.  Patient will call office if needed prior to next encounter : Review of patient past medical history, allergies, medications, health status, including review of consultants reports, laboratory and other test data, was performed as part of comprehensive evaluation and provision of chronic care management services.   Care Plan  Allergies  Allergen Reactions   Tree Extract Swelling and Other (See Comments)    Swelling to eyes   Augmentin [Amoxicillin-Pot Clavulanate] Other (See Comments)    Headache, dizzy   Lisinopril Cough    Face/throat swelling   Ciprofloxacin Hives    Outpatient Encounter Medications as of 03/25/2021  Medication Sig    acetaminophen (TYLENOL) 500 MG tablet Take 2 tablets (1,000 mg total) by mouth every 6 (six) hours as needed.   Ascorbic Acid (VITAMIN C) 1000 MG tablet Take 1,000 mg by mouth every other day.    b complex vitamins tablet Take 1 tablet by mouth daily.   BIKTARVY 50-200-25 MG TABS tablet TAKE 1 TABLET BY MOUTH DAILY   BIOTIN PO Take 1 tablet by mouth daily as needed (for supplementation).    calcium carbonate (OSCAL) 1500 (600 Ca) MG TABS tablet Take 600 mg of elemental calcium by mouth daily with breakfast.   Cholecalciferol (SM VITAMIN D3) 100 MCG (4000 UT) CAPS Take 1 capsule (4,000 Units total) by mouth daily.   cloNIDine (CATAPRES) 0.1 MG tablet TAKE 1 TABLET(0.1 MG) BY MOUTH EVERY 12 HOURS   cyanocobalamin (,VITAMIN B-12,) 1000 MCG/ML injection ADMINISTER 1 ML(1000 MCG) IN THE MUSCLE EVERY 30 DAYS   cyclobenzaprine (FLEXERIL) 5 MG tablet Take 1 tablet (5 mg total) by mouth 3 (three) times daily as needed for muscle spasms. Start taking at night to see how they affect you.   diltiazem (CARDIZEM CD) 240 MG 24 hr capsule Take 1 capsule (240 mg total) by mouth daily.   fluticasone furoate-vilanterol (BREO ELLIPTA) 200-25 MCG/INH AEPB Inhale 1 puff into the lungs daily.   furosemide (LASIX) 20 MG tablet Take 20 mg by mouth every other day.   MAGNESIUM PO Take 1 tablet by mouth every other day.    Melatonin 5 MG TABS Take 5 mg by mouth at bedtime as needed (for sleep).   Misc. Devices (PULSE OXIMETER FOR FINGER) MISC 1 Units by Does not apply route  as needed.   Mouthwashes (BIOTENE DRY MOUTH GENTLE) LIQD Use as directed 1 Dose in the mouth or throat 2 (two) times daily as needed (dry mouth).   Omega-3 Fatty Acids (FISH OIL) 1000 MG CAPS Take 1,000 mg by mouth daily as needed (pt prefrence).   omeprazole (PRILOSEC) 40 MG capsule Take 1 capsule (40 mg total) by mouth daily as needed (heart burn).   oxyCODONE-acetaminophen (PERCOCET/ROXICET) 5-325 MG tablet Take 1 tablet by mouth every 4 (four) hours  as needed for severe pain.   OXYGEN Inhale 1.5-2 L into the lungs See admin instructions. Uses at bedtime and through the day as needed   PROAIR HFA 108 (90 Base) MCG/ACT inhaler Inhale 2 puffs into the lungs every 4 (four) hours as needed for wheezing or shortness of breath.   rivaroxaban (XARELTO) 20 MG TABS tablet Take 1 tablet (20 mg total) by mouth daily.   senna (SENOKOT) 8.6 MG tablet Take 1 tablet by mouth daily as needed for constipation.   spironolactone (ALDACTONE) 25 MG tablet TAKE 1 TABLET(25 MG) BY MOUTH DAILY   SYRINGE/NEEDLE, DISP, 1 ML (B-D SYRINGE/NEEDLE 1CC/25GX5/8) 25G X 5/8" 1 ML MISC 1 Units by Does not apply route every 30 (thirty) days.   zinc gluconate 50 MG tablet Take 50 mg by mouth daily.   No facility-administered encounter medications on file as of 03/25/2021.    Patient Active Problem List   Diagnosis Date Noted   Rib fracture 03/12/2021   Osteoporosis 03/12/2021   Pyelonephritis 02/27/2021   Panniculitis 01/23/2021   Healthcare maintenance 01/23/2021   On supplemental oxygen therapy 11/19/2020   Insomnia 11/14/2020   Burn erythema of breast, initial encounter 11/05/2020   Acute back pain 09/17/2020   Shoulder blade pain 08/09/2020   Skin ulcer ([place]) 06/24/2020   Bronchitis 03/07/2020   Pulmonary nodule 02/14/2020   Nodule of upper lobe of left lung 02/06/2020   Thyroid nodule 01/26/2020   Adenocarcinoma of lung ([place]) 01/04/2020   Chronic obstructive asthma ([place]) 12/29/2019   Myalgia 10/24/2019   (HFpEF) heart failure with preserved ejection fraction ([place]) 02/07/2019   Leg swelling 11/03/2018   Other fatigue 06/30/2018   Shortness of breath on exertion 06/30/2018   Hyperglycemia 06/30/2018   Vitamin D deficiency 06/30/2018   Chronic anticoagulation 01/03/2018   Chronic respiratory failure with hypoxia ([place]) 11/30/2017   Constipation 11/03/2017   Atrial flutter ([place]) 03/11/2017   CAD (coronary artery disease) 01/28/2017   Obesity (BMI 30-39.9)  01/13/2017   Ascending aorta dilatation ([place]) 01/07/2017   Low back pain radiating to right lower extremity 12/02/2016   Acquired cyst of kidney 12/02/2016   Chronic kidney disease (CKD), stage III (moderate) (HCC) 12/01/2016   Generalized anxiety disorder 10/22/2016   Hypersomnia 10/01/2016   Accessory skin tags 06/10/2016   Nocturnal hypoxemia 05/13/2016   Cervical radiculopathy 04/14/2016   GERD (gastroesophageal reflux disease) 01/02/2016   Morbid (severe) obesity due to excess calories ([place]) 10/10/2015   Vitamin B12 deficiency 10/02/2015   Moderate persistent asthma with exacerbation 07/09/2015   HIV disease ([place]) 07/09/2015   Uncontrolled hypertension 07/09/2015    Conditions to be addressed/monitored: CHF, HTN, COPD, and DMII  Care Plan : CCM RN- Cancer Treatment Phase (Adult)- patient diagnosed with lung cancer June 2021  Updates made by [name], RN since 03/25/2021 12:00 AM     Problem: Patient lives alone with no family nearby with recent  diagnosis of lung cancer ( June 2021) with numerous chronic disease states  Priority: High  Onset Date: 08/28/2020     Goal: Disease Progression Minimized or Managed   Start Date: 08/28/2020  Expected End Date: 05/23/2021  Recent Progress: On track  Priority: High  Note:   Current Barriers:   Chronic Disease Management support and education needs related to [facility], CAD, HIV,  lung Ca diagnosed June 2021 Unable to independently manage chronic disease states- successful outreach to patient via phone to complete follow up assessment, patient states she is still having pain from her Right 10th rib fracture and she is taking tylenol.  Dr. Jimmye Norman gave her a prescription for Percocet but she is not taking them very often as she does not want to get addicted to them.  Patient also notes that she has swelling in her legs which has increased over the past several days.  She is taking her Furosemide ordered for every  other day and she is taking daily for a few days along with her elevating her legs.  Patient seems very aware of her illnesses and she knows when she is retaining fluid.  She is not having an increase in shortness of breath.  Patient states she will call clinic if she needs to be seen, however, she has an appointment with her oncologist on 03/27/21 and he will be able to evaluate if needed.  Patient noted that she wants to be referred to a Nephrologist for the cysts on her kidneys and requested this RNCM ask MD to send referral.  Patient does not have a specific Nephrologist she wants to go to.   Patient asked this RNCM to see if MD will write her a prescription for a lift chair as well as the referral to Nephrology.  Nurse Case Manager Clinical Goal(s):  Over the next 30-60 days, patient will attend all scheduled medical appointments: related to chronic disease states Over the next 30-60  days, the patient will demonstrate ongoing self health care management ability as evidenced by no unplanned hospital admissions   Interventions:  1:1 collaboration with [name], MD regarding development and update of comprehensive plan of care as evidenced by provider attestation and co-signature Inter-disciplinary care team collaboration (see longitudinal plan of care) Evaluation of current treatment plan related to chronic disease states and patient's adherence to plan as established by provider. Assessed medication taking behavior Positive reinforcement given  to patient for plans to enroll in school and joining exercise programs at 2 facilities Discussed plans with patient for ongoing care management follow up and provided patient with direct contact information for care management team  Patient Goals/Self-Care Activities Over the next 30-60 days, patient will:  - Patient will self administer medications as prescribed Patient will attend all scheduled provider appointments Patient will call pharmacy  for medication refills Patient will continue to perform ADL's independently Patient will continue to perform IADL's independently Patient will call provider office for new concerns or questions - eliminate COPD/Asthma symptom triggers at home - follow rescue plan if symptoms flare-up - keep follow-up appointments - use an extra pillow to sleep - arrange a ride through an agency 1 week before appointment - ask family or friend for a ride - call to cancel if needed - keep a calendar with appointment dates - eat healthy - get a least 8 hours of sleep at night - use devices that will help like a cane, sock-puller or reacher - use meditation or relaxation techniques - wash my hands with mild soap and water - wash my hands  after using the bathroom - wash my hands after coughing, sneezing or blowing nose - stay away from people who are sick - begin personal counseling on 09/05/20 - talk about feelings with a friend, family or spiritual advisor - practice positive thinking and self-talk  Follow Up Plan: The care management team will reach out to the patient again over the next 30-60 days.          Plan: Telephone follow up appointment with care management team member scheduled for:  67 days  [name], RN, BSN, CCM Care Management Coordinator [facility] Phone: [phone] / Fax: [phone]

## 2021-03-25 NOTE — Telephone Encounter (Signed)
CALLED PATIENT TO INFORM THAT FU APPT. HAS BEEN MOVED TO 04-07-21 @ 11:45 AM, PATIENT AGREED TO NEW DATE AND TIME FOR FU

## 2021-03-25 NOTE — Patient Instructions (Signed)
Visit Information   Goals Addressed             This Visit's Progress    Make and Keep All Appointments       Timeframe:  Long-Range Goal Priority:  High Start Date:         08/28/20                    Expected End Date:       05/23/21                Follow Up Date 05/23/21   - arrange a ride through an agency 1 week before appointment - ask family or friend for a ride - call to cancel if needed - keep a calendar with appointment dates    Why is this important?   Part of staying healthy is seeing the doctor for follow-up care.  If you forget your appointments, there are some things you can do to stay on track.    Notes: meeting goal     Manage Fatigue (Tiredness- Cancer Treatment)- patient diagnosed with lung cancer June 2021       Follow Up Date 05/23/21   - eat healthy - get a least 8 hours of sleep at night - use devices that will help like a cane, sock-puller or reacher - use meditation or relaxation techniques    Why is this important?   Cancer treatment and its side effects can drain your energy. It can keep you from doing things you would like to do.  There are many things that you can do to manage fatigue.    Notes:      Track and Manage My Symptoms-COPD/asthma       Timeframe:  Long-Range Goal Priority:  High Start Date:              08/28/20               Expected End Date:           ongoing           Follow Up Date 05/23/21   - eliminate symptom triggers at home - follow rescue plan if symptoms flare-up - keep follow-up appointments - use an extra pillow to sleep    Why is this important?   Tracking your symptoms and other information about your health helps your doctor plan your care.  Write down the symptoms, the time of day, what you were doing and what medicine you are taking.  You will soon learn how to manage your symptoms.     Notes:         The patient verbalized understanding of instructions, educational materials, and care plan provided  today and declined offer to receive copy of patient instructions, educational materials, and care plan.   Telephone follow up appointment with care management team member scheduled for:04/22/2021@12 :[place], RN, BSN, CCM Care Management Coordinator [facility] Phone: [phone] / Fax: [phone]

## 2021-03-27 ENCOUNTER — Ambulatory Visit: Payer: Medicare Other | Admitting: Radiation Oncology

## 2021-03-31 ENCOUNTER — Ambulatory Visit: Payer: Self-pay | Admitting: Radiation Oncology

## 2021-03-31 ENCOUNTER — Encounter: Payer: Self-pay | Admitting: Radiology

## 2021-04-01 ENCOUNTER — Ambulatory Visit (HOSPITAL_COMMUNITY): Payer: Medicare Other

## 2021-04-02 ENCOUNTER — Telehealth: Payer: Self-pay | Admitting: Internal Medicine

## 2021-04-02 DIAGNOSIS — N1831 Chronic kidney disease, stage 3a: Secondary | ICD-10-CM

## 2021-04-02 NOTE — Telephone Encounter (Signed)
Pt calling to request a Referral to a Kidney Specialist. Pt states she has been in twice in July and has asked for a referral but has not been referred.  Please advise if she will need an OV vs a New Referral being sent.

## 2021-04-02 NOTE — Telephone Encounter (Signed)
Referral placed.

## 2021-04-03 ENCOUNTER — Encounter: Payer: Self-pay | Admitting: *Deleted

## 2021-04-03 ENCOUNTER — Encounter: Payer: Self-pay | Admitting: Internal Medicine

## 2021-04-03 NOTE — Telephone Encounter (Signed)
Completed, given to Triage.

## 2021-04-03 NOTE — Progress Notes (Signed)
Referral placed, thank you

## 2021-04-03 NOTE — Telephone Encounter (Signed)
Called to pt let her know about the letter; no answer. Left message on self-identified vm to call back if she will pick up letter or she wants it mailed.

## 2021-04-05 DIAGNOSIS — J449 Chronic obstructive pulmonary disease, unspecified: Secondary | ICD-10-CM | POA: Diagnosis not present

## 2021-04-07 ENCOUNTER — Ambulatory Visit: Payer: Medicare Other | Admitting: Radiation Oncology

## 2021-04-09 ENCOUNTER — Other Ambulatory Visit: Payer: Self-pay | Admitting: Adult Health

## 2021-04-09 DIAGNOSIS — J9611 Chronic respiratory failure with hypoxia: Secondary | ICD-10-CM

## 2021-04-09 DIAGNOSIS — J441 Chronic obstructive pulmonary disease with (acute) exacerbation: Secondary | ICD-10-CM

## 2021-04-11 ENCOUNTER — Ambulatory Visit (HOSPITAL_COMMUNITY): Payer: Medicare Other

## 2021-04-11 ENCOUNTER — Encounter (HOSPITAL_COMMUNITY): Payer: Self-pay

## 2021-04-14 ENCOUNTER — Encounter: Payer: Self-pay | Admitting: Internal Medicine

## 2021-04-14 ENCOUNTER — Other Ambulatory Visit: Payer: Self-pay

## 2021-04-14 ENCOUNTER — Ambulatory Visit: Payer: Medicare Other | Admitting: Radiation Oncology

## 2021-04-14 ENCOUNTER — Ambulatory Visit (INDEPENDENT_AMBULATORY_CARE_PROVIDER_SITE_OTHER): Payer: Medicare Other | Admitting: Internal Medicine

## 2021-04-14 VITALS — BP 145/98 | HR 99 | Temp 98.3°F | Ht 63.0 in | Wt 237.5 lb

## 2021-04-14 DIAGNOSIS — S29011A Strain of muscle and tendon of front wall of thorax, initial encounter: Secondary | ICD-10-CM

## 2021-04-14 DIAGNOSIS — J4551 Severe persistent asthma with (acute) exacerbation: Secondary | ICD-10-CM | POA: Diagnosis not present

## 2021-04-14 DIAGNOSIS — J9611 Chronic respiratory failure with hypoxia: Secondary | ICD-10-CM | POA: Diagnosis not present

## 2021-04-14 DIAGNOSIS — I5032 Chronic diastolic (congestive) heart failure: Secondary | ICD-10-CM

## 2021-04-14 DIAGNOSIS — S2231XD Fracture of one rib, right side, subsequent encounter for fracture with routine healing: Secondary | ICD-10-CM | POA: Diagnosis not present

## 2021-04-14 MED ORDER — METHOCARBAMOL 750 MG PO TABS: ORAL_TABLET | ORAL | 0 refills | Status: AC

## 2021-04-14 MED ORDER — NAPROXEN 500 MG PO TABS: 500.0000 mg | ORAL_TABLET | Freq: Two times a day (BID) | ORAL | 0 refills | Status: AC

## 2021-04-14 MED ORDER — PREDNISONE 20 MG PO TABS: 40.0000 mg | ORAL_TABLET | Freq: Every day | ORAL | 0 refills | Status: AC

## 2021-04-14 MED ORDER — DICLOFENAC SODIUM 1 % EX GEL: 4.0000 g | Freq: Four times a day (QID) | CUTANEOUS | 0 refills | Status: DC

## 2021-04-14 MED ORDER — AZITHROMYCIN 250 MG PO TABS: ORAL_TABLET | ORAL | 0 refills | Status: AC

## 2021-04-14 NOTE — Assessment & Plan Note (Signed)
Patient has lower extremity edema on exam.She says she has been taking increased dose of lasix once a day already at home. This possibly could be a component of venous stasis from obesity.Will increase to BID for 3 days and have patient come back to office for labs and exam.

## 2021-04-14 NOTE — Progress Notes (Signed)
   CC: Intercostal muscle strain, moderate persistent asthma with exacerbation  HPI:[name] is a 65 y.o. female who presents for evaluation of intercostal muscle strain and acute obstructive asthma exacerbation. Please see individual problem based A/P for details.   Past Medical History:  Diagnosis Date   Anemia    Anxiety    HX PANIC ATTACKS   Arthritis    "starting to; in my hands" (07/09/2015)   Asthma    Atrial fibrillation (HCC)    Atrial flutter, paroxysmal (HCC)    Bloated abdomen    CFS (chronic fatigue syndrome)    Chewing difficulty    Chronic asthma with acute exacerbation    "I have chronic asthma all the time; sometimes exacerbations" (07/09/2015)   Chronic lower back pain    COPD (chronic obstructive pulmonary disease) ([place])    Cyst of right kidney    "3 of them; dx'd in ~ 01/2015"   Dyspnea    GERD (gastroesophageal reflux disease)    Heart murmur    History of blood transfusion    "related to my brain surgery I think"   History of pulmonary embolism 07/09/2015   History of radiation therapy 04/09/20-04/22/20   SBRT Left Lung, Dr. Gery Pray   HIV antibody positive ([place])    HIV disease ([place])    Hyperlipidemia    Hypertension    Leg edema    Lipodystrophy    Mild CAD 2013   Multiple thyroid nodules    Osteopenia    Palpitations    Pneumonia 07/09/2015   Shingles    Sleep apnea    "never completed part 2 of study; never wore mask" (07/09/2015)   Vitamin B 12 deficiency    Vitamin D deficiency    Review of Systems:   Review of Systems  Constitutional:  Negative for fever.       No weight gain  Respiratory:  Positive for sputum production and shortness of breath.   Cardiovascular:  Positive for leg swelling. Negative for orthopnea.  Musculoskeletal:        Left rib pain    Physical Exam: Vitals:   04/14/21 0940  BP: (!) 145/98  Pulse: 99  Temp: 98.3 F (36.8 C)  TempSrc: Oral  SpO2: 94%  Weight: 237 lb 8 oz (107.7 kg)  Height:  5\' 3"  (1.6 m)   General: Obese, well kept, speaking in full sentences  HEENT: Conjunctiva nl , antiicteric sclerae, moist mucous membranes, no exudate or erythema Cardiovascular: Normal rate, regular rhythm.  No murmurs, rubs, or gallops. No appreciable JVD  Pulmonary : Diminished breath sounds throughout, end expiratory wheeze, no rales Abdominal: soft, nontender,  bowel sounds present Msk: Mid axillary, at level of T5 T6 pain with palpation Ext: 2+ LEE in right leg, 1+ in left leg and unequal edema is chronic , chronic venous stasis skin changes on ankles  Assessment & Plan:   See Encounters Tab for problem based charting.  Patient discussed with Dr. Dareen Piano

## 2021-04-14 NOTE — Assessment & Plan Note (Addendum)
  HPI: Patient reports continue pain from her rib fracture. She is having pain on her left side, review of recent imaging shows right side 10th rib fracture. Patient says you are right, it was the right side. She does not know where the left sided pain come from. She has received radiation for Stage IA (T1c, N0, M0) non-small cell lung cancer of the left upper lobe, adenocarcinoma.  CT chest high resolution on 09/20/2020 :There is asymmetric soft tissue stranding overlying the intrinsic musculature of the left chest wall, similar in appearance to prior examination dated 08/21/2020, suggestive of contusion or other nonspecific inflammation. There is no underlying fracture or discrete mass noted.  Assessment/Plan: Intercostal muscle strain, initial encounter. She is already scheduled for repeat CT Chest on Monday, will not order imaging today. Treating inflammation with naproxen , and other medications below to help with symptoms relief.  - methocarbamol (ROBAXIN) 750 MG tablet; Take 2 tablets (1,500 mg total) by mouth 3 (three) times daily for 3 days, THEN 1 tablet (750 mg total) 3 (three) times daily for 3 days.  Dispense: 27 tablet; Refill: 0 - naproxen (NAPROSYN) 500 MG tablet; Take 1 tablet (500 mg total) by mouth 2 (two) times daily with a meal for 10 days.  Dispense: 20 tablet; Refill: 0 - diclofenac Sodium (VOLTAREN) 1 % GEL; Apply 4 g topically 4 (four) times daily.  Dispense: 150 g; Refill: 0

## 2021-04-14 NOTE — Assessment & Plan Note (Addendum)
HPI: Patient reports she has chronic shortness of breath throughout the day. She wears 1.5L supplemental oxygen at night and has been using it some during the day. She is currently prescribed Breo and albuterol.  Endorses wheezing, sob, sputum production . Nocturnal 1-2/wk Exacerbations: >2/year Spirometry 05/2018 ratio 55, FEV1 28%,FVC 38%.  Assessment/Plan: acute exacerbation of  severe persistent asthma - Patient reports increased sputum production, sob, and sputum production. End expiratory wheezing, oxygen sat 94% on RA. - predniSONE (DELTASONE) 20 MG tablet; Take 2 tablets (40 mg total) by mouth daily with breakfast for 5 days.  Dispense: 10 tablet; Refill: 0 - azithromycin (ZITHROMAX Z-PAK) 250 MG tablet; Take 2 tablets (500 mg) on  Day 1,  followed by 1 tablet (250 mg) once daily on Days 2 through 5.  Dispense: 6 each; Refill: 0 - BMP8+Anion Gap - CBC with Diff

## 2021-04-14 NOTE — Patient Instructions (Signed)
Thank you for trusting me with your care. To recap, today we discussed the following:  1. Closed fracture of one rib   - predniSONE (DELTASONE) 20 MG tablet; Take 2 tablets (40 mg total) by mouth daily with breakfast for 5 days.  Dispense: 10 tablet; Refill: 0 - azithromycin (ZITHROMAX Z-PAK) 250 MG tablet; Take 2 tablets (500 mg) on  Day 1,  followed by 1 tablet (250 mg) once daily on Days 2 through 5.  Dispense: 6 each; Refill: 0 - diclofenac Sodium (VOLTAREN) 1 % GEL; Apply 4 g topically 4 (four) times daily.  Dispense: 150 g; Refill: 0  2. Chronic respiratory failure with hypoxia (HCC)  - methocarbamol (ROBAXIN) 750 MG tablet; Take 2 tablets (1,500 mg total) by mouth 3 (three) times daily for 3 days, THEN 1 tablet (750 mg total) 3 (three) times daily for 3 days.  Dispense: 27 tablet; Refill: 0 - naproxen (NAPROSYN) 500 MG tablet; Take 1 tablet (500 mg total) by mouth 2 (two) times daily with a meal for 10 days.  Dispense: 20 tablet; Refill: 0 - BMP8+Anion Gap - CBC with Diff

## 2021-04-15 LAB — CBC WITH DIFFERENTIAL/PLATELET
Basophils Absolute: 0 10*3/uL (ref 0.0–0.2)
Basos: 1 %
EOS (ABSOLUTE): 0.1 10*3/uL (ref 0.0–0.4)
Eos: 2 %
Hematocrit: 36.7 % (ref 34.0–46.6)
Hemoglobin: 12.2 g/dL (ref 11.1–15.9)
Immature Grans (Abs): 0 10*3/uL (ref 0.0–0.1)
Immature Granulocytes: 0 %
Lymphocytes Absolute: 1.1 10*3/uL (ref 0.7–3.1)
Lymphs: 19 %
MCH: 30.5 pg (ref 26.6–33.0)
MCHC: 33.2 g/dL (ref 31.5–35.7)
MCV: 92 fL (ref 79–97)
Monocytes Absolute: 0.4 10*3/uL (ref 0.1–0.9)
Monocytes: 7 %
Neutrophils Absolute: 4.3 10*3/uL (ref 1.4–7.0)
Neutrophils: 71 %
Platelets: 229 10*3/uL (ref 150–450)
RBC: 4 x10E6/uL (ref 3.77–5.28)
RDW: 13.1 % (ref 11.7–15.4)
WBC: 6 10*3/uL (ref 3.4–10.8)

## 2021-04-15 LAB — BMP8+ANION GAP
Anion Gap: 14 mmol/L (ref 10.0–18.0)
BUN/Creatinine Ratio: 14 (ref 12–28)
BUN: 19 mg/dL (ref 8–27)
CO2: 29 mmol/L (ref 20–29)
Calcium: 9.7 mg/dL (ref 8.7–10.3)
Chloride: 98 mmol/L (ref 96–106)
Creatinine, Ser: 1.35 mg/dL — ABNORMAL HIGH (ref 0.57–1.00)
Glucose: 99 mg/dL (ref 65–99)
Potassium: 3.8 mmol/L (ref 3.5–5.2)
Sodium: 141 mmol/L (ref 134–144)
eGFR: 43 mL/min/{1.73_m2} — ABNORMAL LOW (ref >59)

## 2021-04-16 ENCOUNTER — Other Ambulatory Visit: Payer: Self-pay | Admitting: Internal Medicine

## 2021-04-16 DIAGNOSIS — J4551 Severe persistent asthma with (acute) exacerbation: Secondary | ICD-10-CM

## 2021-04-16 DIAGNOSIS — S29011A Strain of muscle and tendon of front wall of thorax, initial encounter: Secondary | ICD-10-CM

## 2021-04-16 MED ORDER — ALBUTEROL SULFATE (2.5 MG/3ML) 0.083% IN NEBU: 2.5000 mg | INHALATION_SOLUTION | RESPIRATORY_TRACT | 2 refills | Status: DC | PRN

## 2021-04-16 NOTE — Progress Notes (Signed)
Shannon [name] requested medical corset to help give her support and relieve pain. We discussed a corset could compress and has not proven to benefit back pain or rib pain. I expect this support could lead to further muscle weakness and cause more pain. She wears it loosely so it doesn't cause too much compression. She is addiment she has found relief with previous corset. I will place DME for this Corset, as I have explained risk and patient endorsees some benefit. In addition she needs a refill for albuterol nebulizer.

## 2021-04-17 NOTE — Progress Notes (Signed)
Internal Medicine Clinic Attending  Case discussed with Dr. Steen  At the time of the visit.  We reviewed the resident's history and exam and pertinent patient test results.  I agree with the assessment, diagnosis, and plan of care documented in the resident's note.  

## 2021-04-20 DIAGNOSIS — R911 Solitary pulmonary nodule: Secondary | ICD-10-CM | POA: Diagnosis not present

## 2021-04-20 DIAGNOSIS — R269 Unspecified abnormalities of gait and mobility: Secondary | ICD-10-CM | POA: Diagnosis not present

## 2021-04-20 DIAGNOSIS — J449 Chronic obstructive pulmonary disease, unspecified: Secondary | ICD-10-CM | POA: Diagnosis not present

## 2021-04-20 DIAGNOSIS — J45909 Unspecified asthma, uncomplicated: Secondary | ICD-10-CM | POA: Diagnosis not present

## 2021-04-21 ENCOUNTER — Ambulatory Visit: Payer: Medicare Other | Admitting: Adult Health

## 2021-04-21 ENCOUNTER — Ambulatory Visit (HOSPITAL_COMMUNITY): Payer: Medicare Other

## 2021-04-22 ENCOUNTER — Telehealth: Payer: Medicare Other

## 2021-04-23 ENCOUNTER — Encounter: Payer: Medicare Other | Admitting: Internal Medicine

## 2021-04-25 ENCOUNTER — Ambulatory Visit (HOSPITAL_COMMUNITY)
Admission: RE | Admit: 2021-04-25 | Discharge: 2021-04-25 | Disposition: A | Discharge status: Home / Self Care | Payer: Medicare Other | Source: Ambulatory Visit | Attending: Radiation Oncology | Admitting: Radiation Oncology

## 2021-04-25 ENCOUNTER — Other Ambulatory Visit: Payer: Self-pay

## 2021-04-25 DIAGNOSIS — C3490 Malignant neoplasm of unspecified part of unspecified bronchus or lung: Secondary | ICD-10-CM | POA: Diagnosis not present

## 2021-04-25 DIAGNOSIS — C3492 Malignant neoplasm of unspecified part of left bronchus or lung: Secondary | ICD-10-CM | POA: Diagnosis not present

## 2021-04-25 DIAGNOSIS — I700 Atherosclerosis of aorta: Secondary | ICD-10-CM | POA: Diagnosis not present

## 2021-04-25 DIAGNOSIS — R911 Solitary pulmonary nodule: Secondary | ICD-10-CM | POA: Diagnosis not present

## 2021-04-29 ENCOUNTER — Telehealth: Payer: Medicare Other

## 2021-04-29 ENCOUNTER — Telehealth: Payer: Self-pay

## 2021-04-29 NOTE — Telephone Encounter (Signed)
  Care Management   Outreach Note  04/29/2021 Name: Shannon [name] MRN: [id number] DOB: 11-19-1955  Referred by: [name], MD Reason for referral : No chief complaint on file.   An unsuccessful telephone outreach was attempted today. The patient was referred to the case management team for assistance with care management and care coordination.   Follow Up Plan: If patient returns call to provider office, please advise to call [place] at [phone].  [name], RN, BSN, CCM Care Management Coordinator [facility] Phone: [phone] / Fax: [phone]

## 2021-05-02 ENCOUNTER — Ambulatory Visit: Payer: Medicare Other | Admitting: Adult Health

## 2021-05-06 ENCOUNTER — Telehealth: Payer: Self-pay | Admitting: *Deleted

## 2021-05-06 ENCOUNTER — Ambulatory Visit (HOSPITAL_BASED_OUTPATIENT_CLINIC_OR_DEPARTMENT_OTHER): Payer: Medicare Other | Admitting: Cardiovascular Disease

## 2021-05-06 DIAGNOSIS — J449 Chronic obstructive pulmonary disease, unspecified: Secondary | ICD-10-CM | POA: Diagnosis not present

## 2021-05-06 NOTE — Telephone Encounter (Signed)
CALLED PATIENT TO ASK ABOUT RESCHEDULING FU, UNABLE TO LVM DUE TO MEMORY BEING FULL

## 2021-05-06 NOTE — Progress Notes (Incomplete)
Virtual Visit via Telephone Note   This visit type was conducted due to national recommendations for restrictions regarding the COVID-19 Pandemic (e.g. social distancing) in an effort to limit this patient's exposure and mitigate transmission in our community.  Due to her co-morbid illnesses, this patient is at least at moderate risk for complications without adequate follow up.  This format is felt to be most appropriate for this patient at this time.  The patient did not have access to video technology/had technical difficulties with video requiring transitioning to audio format only (telephone).  All issues noted in this document were discussed and addressed.  No physical exam could be performed with this format.  Please refer to the patient's chart for her  consent to telehealth for [facility].   The patient was identified using 2 identifiers.  Date:  05/06/2021   ID:  JEFFREY VOTH, DOB 12/16/1955, MRN [id number]  Patient Location: Home Provider Location: Home Office  PCP:  [name], MD  Cardiologist:  Skeet Latch, MD  Electrophysiologist:  None   Evaluation Performed:  Follow-Up Visit  Chief Complaint:    History of Present Illness:    The patient does not have symptoms concerning for COVID-19 infection (fever, chills, cough, or new shortness of breath).   Shannon [name] is a 65 y.o. female with stage IA (T1c, N0, M0) non-small cell lung cancer starting XRT, hypertension, paroxysmal atrial flutter, asthma, medication non-compliance, non-obstructive CAD, OSA, prior PE, moderate pulmonary hypertension, mild ascending aorta aneurysm, PAD, and HIV (well-controlled)  who presents via video conferencing for a telehealth visit today.  Shannon [name] was initially referred by [name], MD, on 12/26/15. [name] was concerned about her lower extremity edema and shortness of breath.  She was started on lasix 20 mg every other day.  Shannon [name] noted shortness of breath after a  hospitalization for pneumonia 06/2015.   She denied lower extremity edema or orthopnea but did report chest pain when laying down at night.  She was referred for an echo 01/2016 that revealed LVEF 60-65% awith grade 1 diastolic dysfunction and hypokinesis of the basal inferior wall.  She also had very mild aortic stenosis with a mean gradient of 9 mmHg and trivial AR.  PASP was 52 mmHg.  She had a Lexiscan Myoview at that time that was negative for ischemia.  Shannon [name] previously had a Lexiscan Cardiolite in September 2013 that revealed a medium, mild reversible defect in the anterior wall and normal systolic function. She subsequently underwent cardiac catheterization that revealed nonobstructive coronary disease. She had mild pulmonary hypertension and a normal cardiac output. Left ventriculography revealed mild to moderate aortic regurgitation.  She had a 24-hour Holter in February 2015 that revealed rare PACs and PVCs.  Echo 01/06/17 revealed LVEF 60-65% with mild LVH and a mean aortic valve gradient of 8 mmHg.  Tricuspid regurgitation was not significant enough to assess pulmonary pressures.  Repeat echo 01/2019 revealed LVEF >65%, severe LVH, intracavitary gradient 61 mmHg, mild AS (mean gradient 13 mmHg), mild-moderate AR, and mild ascending aorta aneurysm (4.1 cm).   Shannon [name] was admitted to the hospital 01/25/17 with hypertensive emergency and chest pain. Her blood pressure was 215/100.  She consistently struggles with mediation adherence and has strong feelings about what medicines she should take and when she should take them.  Shannon [name] [facility] and has received a lot of help there but hasn't been going  lately 2/2 COVID-19.  Lisinopril was stopped due to cough and throat swelling.  Shannon [name] reported claudication and had ABIs performed by her PCP, Dr. Daryll Drown.  ABI/Dopplers 02/2019 revealed moderate R LE arterial disease and mild L LE disease.  She was referred to  our vascular specialists but prefers to keep seeing me.    Shannon [name]'s clonidine was increased to 0.3 mg weekly.  Prior to the vaccine her BP was running in the 130s-140s.  She decided to stop taking her clonidine for a few days before she got her vaccine because she did not know how it would affect her.  She had a repeat echocardiogram 10/2019 that revealed LVEF 60 to 65% with mild LVH and grade 1 diastolic dysfunction.  The ascending aorta was normal in size.  Shannon [name] was diagnosed with Stage 1 non-small cell lung cancer.  She was not a surgical candidate.  She had XRT and experienced some hair loss. She saw her primary care 04/14/21 and was noted to have LE edema. They increased her Lasix for 3 days.  Today,  She denies any palpitations, chest pain, or shortness of breath. No lightheadedness, headaches, syncope, orthopnea, or PND. Also has no lower extremity edema or exertional symptoms.   Prior CV studies:    The following studies were reviewed today:  Echo 01/06/17: Study Conclusions   - Left ventricle: The cavity size was normal. Wall thickness was   increased in a pattern of mild LVH. Systolic function was normal.   The estimated ejection fraction was in the range of 60% to 65%.   Wall motion was normal; there were no regional wall motion   abnormalities. Doppler parameters are consistent with abnormal   left ventricular relaxation (grade 1 diastolic dysfunction). - Aortic valve: Trileaflet; moderately calcified leaflets.   Sclerosis without stenosis. There was mild to moderate   regurgitation. Mean gradient (S): 8 mm Hg. - Mitral valve: There was no significant regurgitation. - Left atrium: The atrium was moderately dilated. - Right ventricle: The cavity size was normal. Systolic function   was normal. - Right atrium: The atrium was mildly dilated. - Pulmonary arteries: No complete TR doppler jet so unable to   estimate PA systolic pressure. - Systemic veins: IVC measured 2.2  cm with > 50% respirophasic   variation, suggesting RA pressure 8 mmHg.   Impressions: - Normal LV size with mild LV hypertrophy. EF 60-65%. Calcified,   trileaflet aortic valve. Sclerosis without significant stenosis,   mild to moderate aortic insufficiency. Normal RV size and   systolic function. Biatrial enlargement.  Echo 11/15/19:  1. Left ventricular ejection fraction, by estimation, is 60 to 65%. The  left ventricle has normal function. The left ventricle has no regional  wall motion abnormalities. There is mild left ventricular hypertrophy.  Left ventricular diastolic parameters  are consistent with Grade I diastolic dysfunction (impaired relaxation).   2. Right ventricular systolic function is normal. The right ventricular  size is normal. There is normal pulmonary artery systolic pressure. The  estimated right ventricular systolic pressure is 86.7 mmHg.   3. The mitral valve is degenerative. No evidence of mitral valve  regurgitation. No evidence of mitral stenosis. There is turbulence across  the mitral valve but it does not appear restricted and there is no  evidence for a significant pressure gradient  across it.   4. The aortic valve is tricuspid. Aortic valve regurgitation is trivial.  Mild aortic valve sclerosis is present, with no evidence  of aortic valve  stenosis.   5. The inferior vena cava is normal in size with greater than 50%  respiratory variability, suggesting right atrial pressure of 3 mmHg.   Lower extremity Doppler 03/09/19: Tibial vessel disease; one vessel run-off via peroneal artery.     Summary:  Right: Near normal examination. No hemodynamically significant arterial  stenosis/occlusion noted in the CFA, DFA, SFA, popliteal artery and TPT.   Tibial vessel disease; zero vessel run-off.   Left: Near normal examination. No hemodynamically significant arterial  stenosis/occlusion noted in the CFA, DFA, SFA, popliteal artery and TPT.   Tibial vessel  disease; one vessel run-off via peroneal artery.   Past Medical History:  Diagnosis Date   Anemia    Anxiety    HX PANIC ATTACKS   Arthritis    "starting to; in my hands" (07/09/2015)   Asthma    Atrial fibrillation (HCC)    Atrial flutter, paroxysmal (HCC)    Bloated abdomen    CFS (chronic fatigue syndrome)    Chewing difficulty    Chronic asthma with acute exacerbation    "I have chronic asthma all the time; sometimes exacerbations" (07/09/2015)   Chronic lower back pain    COPD (chronic obstructive pulmonary disease) (HCC)    Cyst of right kidney    "3 of them; dx'd in ~ 01/2015"   Dyspnea    GERD (gastroesophageal reflux disease)    Heart murmur    History of blood transfusion    "related to my brain surgery I think"   History of pulmonary embolism 07/09/2015   History of radiation therapy 04/09/20-04/22/20   SBRT Left Lung, Dr. Gery Pray   HIV antibody positive ([place])    HIV disease ([place])    Hyperlipidemia    Hypertension    Leg edema    Lipodystrophy    Mild CAD 2013   Multiple thyroid nodules    Osteopenia    Palpitations    Pneumonia 07/09/2015   Shingles    Sleep apnea    "never completed part 2 of study; never wore mask" (07/09/2015)   Vitamin B 12 deficiency    Vitamin D deficiency    Past Surgical History:  Procedure Laterality Date   ABDOMINAL HYSTERECTOMY     "robotic laparosopic"   BRAIN SURGERY  1974   "brain tumor; benign; on top of my brain; got a plate in there"   BRAIN SURGERY     age 58- -"Tumor pushing my skullout"   BRONCHIAL BIOPSY  02/13/2020   Procedure: BRONCHIAL BIOPSIES;  Surgeon: [name], MD;  Location: [place];  Service: Pulmonary;;   BRONCHIAL BRUSHINGS  02/13/2020   Procedure: BRONCHIAL BRUSHINGS;  Surgeon: [name], MD;  Location: [facility] ENDOSCOPY;  Service: Pulmonary;;   BRONCHIAL NEEDLE ASPIRATION BIOPSY  02/13/2020   Procedure: BRONCHIAL NEEDLE ASPIRATION BIOPSIES;  Surgeon: [name], MD;  Location:  [place];  Service: Pulmonary;;   CARDIAC CATHETERIZATION     FIDUCIAL MARKER PLACEMENT  02/13/2020   Procedure: FIDUCIAL MARKER PLACEMENT;  Surgeon: [name], MD;  Location: [facility] ENDOSCOPY;  Service: Pulmonary;;   TONSILLECTOMY AND ADENOIDECTOMY     VIDEO BRONCHOSCOPY WITH ENDOBRONCHIAL NAVIGATION N/A 02/13/2020   Procedure: VIDEO BRONCHOSCOPY WITH ENDOBRONCHIAL NAVIGATION;  Surgeon: [name], MD;  Location: [facility] ENDOSCOPY;  Service: Pulmonary;  Laterality: N/A;     No outpatient medications have been marked as taking for the 05/06/21 encounter (Appointment) with Skeet Latch, MD.  Allergies:   Tree extract, Augmentin [amoxicillin-pot clavulanate], Lisinopril, and Ciprofloxacin   Social History   Tobacco Use   Smoking status: Never   Smokeless tobacco: Never  Vaping Use   Vaping Use: Never used  Substance Use Topics   Alcohol use: Not Currently    Alcohol/week: 0.0 standard drinks    Comment: Rarely.   Drug use: No     Family Hx: The patient's family history includes Asthma in her mother; Diabetes in her sister; Heart disease in her mother; Heart failure in her mother; Heart murmur in her brother, sister, and sister; Hypertension in her mother; Obesity in her mother; Thyroid disease in her mother and sister.   ROS:   Please see the history of present illness.     All other systems reviewed and are negative.   Labs/Other Tests and Data Reviewed:    Recent Labs: 03/12/2021: ALT 15 04/14/2021: BUN 19; Creatinine, Ser 1.35; Hemoglobin 12.2; Platelets 229; Potassium 3.8; Sodium 141   Recent Lipid Panel Lab Results  Component Value Date/Time   CHOL 227 (H) 03/12/2021 10:20 AM   TRIG 55 03/12/2021 10:20 AM   HDL 68 03/12/2021 10:20 AM   CHOLHDL 3.3 03/12/2021 10:20 AM   CHOLHDL 3.6 11/10/2019 10:55 AM   LDLCALC 150 (H) 03/12/2021 10:20 AM   LDLCALC 164 (H) 11/10/2019 10:55 AM    Wt Readings from Last 3 Encounters:  04/14/21 237 lb 8 oz (107.7 kg)   03/12/21 238 lb 3.2 oz (108 kg)  02/27/21 242 lb 3.2 oz (109.9 kg)     EKG:    09/11/2020: EKG was not ordered. 07/03/2020: Sinus tachycardia.  Rate 103 bpm.  Left axis deviation.  Objective:    There were no vitals taken for this visit. GENERAL: Sounds well.  No acute distress. RESP: Respirations unlabored NEURO:  Speech fluent.  PSYCH:  Cognitively intact, oriented to person place and time   ASSESSMENT & PLAN:    # Tooth extraction:  Three teeth removed successfully.  Getting restorative treatment soon.  # Hypertension:  Blood pressure is high today but has generally been controlled.  She thinks it is high today because she hasn't taken her meds and prednisone.  Will continue to monitor. She doesn't want to increase her medication. Continue clonidine, diltiazem, spironolactone and lasix.   # Chronic diastolic heart failure: No edema.  Breathing improving with treatment of her pneumonia.  Continue BP management as above.   # Aortic regurgitation # Aortic stenosis: Repeat echo 10/2019 was negative for aortic stenosis and ascending aortic aneurysm. Both echos were reviewed.  It appears to be very mild, on the border between aortic stenosis and sclerosis.   # Hyperlipidemia:  Lipids are elevated.  She isn't ready to start more medication.  Discussed limiting fried food, fatty food, cheese and red meat.  She will come for fasting lipids.  # Atrial flutter: Converted with flecainide.  Continue diltiazem and Xarelto.    # Ascending aorta aneurysm: 4.0 on CT 12/2016.  It was unchanged on MRA 10/2018.  4.1 cm on echo 01/2019.  Stable on multiple CT scans.     # PAD: Lipid control as above.  She prefers to manage her other issues prior to seeing her vascular team.    Time:   Today, I have spent 38 minutes with the patient with telehealth technology discussing hypertension.  COVID-19 Education: The signs and symptoms of COVID-19 were discussed with the patient and how to seek care  for testing (  follow up with PCP or arrange E-visit).  The importance of social distancing was discussed today.   Medication Adjustments/Labs and Tests Ordered: Current medicines are reviewed at length with the patient today.  Concerns regarding medicines are outlined above.   Tests Ordered: No orders of the defined types were placed in this encounter.  Medication Changes: No orders of the defined types were placed in this encounter.   Disposition:  Follow up in 3 month(s)  Signed, Madelin Rear  05/06/2021 8:05 AM    [place]

## 2021-05-09 ENCOUNTER — Telehealth: Payer: Self-pay | Admitting: *Deleted

## 2021-05-09 ENCOUNTER — Ambulatory Visit: Payer: Medicare Other | Admitting: Adult Health

## 2021-05-09 NOTE — Chronic Care Management (AMB) (Signed)
  Care Management   Note  05/09/2021 Name: RAYMOND AZURE MRN: [id number] DOB: [birth year]  THANDIWE SIRAGUSA is a 65 y.o. year old female who is a primary care patient of [name], MD and is actively engaged with the care management team. I reached out to [name] by phone today to assist with re-scheduling a follow up visit with the RN Case Manager  Follow up plan: Unsuccessful telephone outreach attempt made. A HIPAA compliant phone message was left for the patient providing contact information and requesting a return call.  The care management team will reach out to the patient again over the next 7 days.  If patient returns call to provider office, please advise to call [place] at [place] [facility]  Direct Dial: [phone]

## 2021-05-12 ENCOUNTER — Telehealth: Payer: Self-pay | Admitting: *Deleted

## 2021-05-12 ENCOUNTER — Ambulatory Visit (INDEPENDENT_AMBULATORY_CARE_PROVIDER_SITE_OTHER): Payer: Medicare Other | Admitting: Adult Health

## 2021-05-12 ENCOUNTER — Other Ambulatory Visit: Payer: Self-pay

## 2021-05-12 ENCOUNTER — Encounter: Payer: Self-pay | Admitting: Adult Health

## 2021-05-12 DIAGNOSIS — J9611 Chronic respiratory failure with hypoxia: Secondary | ICD-10-CM

## 2021-05-12 DIAGNOSIS — C3492 Malignant neoplasm of unspecified part of left bronchus or lung: Secondary | ICD-10-CM

## 2021-05-12 DIAGNOSIS — I483 Typical atrial flutter: Secondary | ICD-10-CM | POA: Diagnosis not present

## 2021-05-12 DIAGNOSIS — J4551 Severe persistent asthma with (acute) exacerbation: Secondary | ICD-10-CM

## 2021-05-12 DIAGNOSIS — E669 Obesity, unspecified: Secondary | ICD-10-CM

## 2021-05-12 DIAGNOSIS — B20 Human immunodeficiency virus [HIV] disease: Secondary | ICD-10-CM

## 2021-05-12 MED ORDER — AZITHROMYCIN 250 MG PO TABS: ORAL_TABLET | ORAL | 0 refills | Status: AC

## 2021-05-12 NOTE — Patient Instructions (Addendum)
ZPack take as directed .  Mucinex DM Twice daily  As needed  Cough/congestion  Saline nasal spray and gel Twice daily.  Albuterol Neb Twice daily.  Continue on Oxygen 2l/m with activity and At bedtime  .  Albuterol Inhaler /  Neb every 4-6 hr as needed wheezing .  Activity as tolerated.  Order for POC (Innogen)  Follow up with Cardiology as planned.  Follow up with Dr. Elsworth Soho  In 33-months or Lynel Forester NP.  Please contact office for sooner follow up if symptoms do not improve or worsen or seek emergency care

## 2021-05-12 NOTE — Telephone Encounter (Signed)
Spoke with patient regarding follow up with Dr. Elsworth Soho, she is requesting to change providers.  Dr. Elsworth Soho, are you ok with patient changing to Dr. Halford Chessman going forward?  Dr. Halford Chessman are you ok seeing patient going forward.  Please advise.  Thank you.

## 2021-05-12 NOTE — Addendum Note (Signed)
Addended by: Vanessa Barbara on: 05/12/2021 05:27 PM   Modules accepted: Orders

## 2021-05-12 NOTE — Assessment & Plan Note (Signed)
Continue on Oxygen 2l/m with activity and At bedtime   POC order

## 2021-05-12 NOTE — Assessment & Plan Note (Signed)
Continue follow up with ID .

## 2021-05-12 NOTE — Progress Notes (Signed)
@Patient  ID: Shannon [name], female    DOB: 1956-06-29, 65 y.o.   MRN: [id number]  Chief Complaint  Patient presents with   Follow-up    Referring provider: [name], MD  HPI: 65 year old female never smoker followed for severe persistent chronic obstructive asthma, chronic hypoxic respiratory failure on oxygen 2 L with activity and at bedtime, allergic rhinitis. Lung cancer diagnosed June 2021 (stage Ia adenocarcinoma status post SBRT 5 sessions) Medical history significant for HIV disease followed by infectious disease.  Lipodystrophy, Atrial flutter previously chronic anticoagulation with Xarelto with stage III chronic kidney disease  TEST/EVENTS :  CT chest 01/10/2020 masslike consolidation in the left upper lobe measuring 2.7 x 1.9 x 1.9 cm   PET scan showed hypermetabolism in this lesion and also in a 9 mm left axillary lymph node and in the left thyroid nodule   10/2019 that revealed LVEF 60 to 65% with mild LVH and grade 1 diastolic dysfunction.  The ascending aorta was normal in size.   Chest xray 05/2019 -cardiomegaly and vascular congestion without edema. No consolidative process, pneumothorax or pleural effusion. No acute or focal bony abnormality.   CTa chest 12/2016 , 01/2017 >neg for PE , multifocal scarring bilaterally   PFT (12/04/15)  FEV1/FVC 50%,  FEV1  0.64  31%. +++ BD response. DLCO 66%.   Spirometry 09/2017-ratio 59, FEV1 24%, FVC 32%   Spirometry 05/2018 ratio 55, FEV1 28%, FVC 38%     Sleep study in 04/2016 was (-) for OSA   05/12/2021 Follow up : Asthma , Lung Cancer , O2 RF  Patient presents for a follow-up visit.  Was last seen January 2022.  She has underlying chronic asthma.  She says overall breathing is doing about the same, gets winded with minimal activity. Previously has tried multiple inhalers including Trelegy, Symbicort, Breo with no perceived benefit.  She is currently on albuterol nebulizer twice daily and as needed. Complains over the  last couple of weeks cough has increased with thick mucus. Requests Zpack . No fever, hemoptysis or chest pain .   Working on weight loss, down 10lbs. Really trying to eat better.   Patient is on oxygen 2 L with activity and At bedtime  . Has larger POC. Wants Innogen POC .   Has a history of lung cancer stage Ia status post radiation.  CT chest April 25, 2021 showed interval development of perihilar volume loss and fibrosis in the left upper lobe in the region of the fiducial markers, consistent with radiation scarring.  2.6 x 2.9 cm lesion has been incorporated into the area of scarring.  No substantial change in the cystic and solid nodule in the right lower lobe.  Went back to school at [employer] , very excited about her classes.   Stopped Xarelto . Previously on for Atrial Flutter . We discussed dangers of Atrial flutter. She has follow up with Cardiology coming up , says she is not going to take xarelto or blood thinners.    Allergies  Allergen Reactions   Tree Extract Swelling and Other (See Comments)    Swelling to eyes   Augmentin [Amoxicillin-Pot Clavulanate] Other (See Comments)    Headache, dizzy   Lisinopril Cough    Face/throat swelling   Ciprofloxacin Hives    Immunization History  Administered Date(s) Administered   PFIZER(Purple Top)SARS-COV-2 Vaccination 12/01/2019, 12/27/2019    Past Medical History:  Diagnosis Date   Anemia    Anxiety    HX PANIC  ATTACKS   Arthritis    "starting to; in my hands" (07/09/2015)   Asthma    Atrial fibrillation (HCC)    Atrial flutter, paroxysmal (HCC)    Bloated abdomen    CFS (chronic fatigue syndrome)    Chewing difficulty    Chronic asthma with acute exacerbation    "I have chronic asthma all the time; sometimes exacerbations" (07/09/2015)   Chronic lower back pain    COPD (chronic obstructive pulmonary disease) ([place])    Cyst of right kidney    "3 of them; dx'd in ~ 01/2015"   Dyspnea    GERD (gastroesophageal reflux  disease)    Heart murmur    History of blood transfusion    "related to my brain surgery I think"   History of pulmonary embolism 07/09/2015   History of radiation therapy 04/09/20-04/22/20   SBRT Left Lung, Dr. Gery Pray   HIV antibody positive ([place])    HIV disease ([place])    Hyperlipidemia    Hypertension    Leg edema    Lipodystrophy    Mild CAD 2013   Multiple thyroid nodules    Osteopenia    Palpitations    Pneumonia 07/09/2015   Shingles    Sleep apnea    "never completed part 2 of study; never wore mask" (07/09/2015)   Vitamin B 12 deficiency    Vitamin D deficiency     Tobacco History: Social History   Tobacco Use  Smoking Status Never  Smokeless Tobacco Never   Counseling given: Not Answered   Outpatient Medications Prior to Visit  Medication Sig Dispense Refill   acetaminophen (TYLENOL) 500 MG tablet Take 2 tablets (1,000 mg total) by mouth every 6 (six) hours as needed. 100 tablet 2   albuterol (PROVENTIL) (2.5 MG/3ML) 0.083% nebulizer solution Take 3 mLs (2.5 mg total) by nebulization every 4 (four) hours as needed for wheezing or shortness of breath. 75 mL 2   Ascorbic Acid (VITAMIN C) 1000 MG tablet Take 1,000 mg by mouth every other day.      b complex vitamins tablet Take 1 tablet by mouth daily.     BIKTARVY 50-200-25 MG TABS tablet TAKE 1 TABLET BY MOUTH DAILY 30 tablet 5   BIOTIN PO Take 1 tablet by mouth daily as needed (for supplementation).      calcium carbonate (OSCAL) 1500 (600 Ca) MG TABS tablet Take 600 mg of elemental calcium by mouth daily with breakfast.     Cholecalciferol (SM VITAMIN D3) 100 MCG (4000 UT) CAPS Take 1 capsule (4,000 Units total) by mouth daily. 30 capsule 0   cyanocobalamin (,VITAMIN B-12,) 1000 MCG/ML injection ADMINISTER 1 ML(1000 MCG) IN THE MUSCLE EVERY 30 DAYS 3 mL 1   cyclobenzaprine (FLEXERIL) 5 MG tablet Take 1 tablet (5 mg total) by mouth 3 (three) times daily as needed for muscle spasms. Start taking at night to  see how they affect you. 30 tablet 2   diclofenac Sodium (VOLTAREN) 1 % GEL Apply 4 g topically 4 (four) times daily. 150 g 0   diltiazem (CARDIZEM CD) 240 MG 24 hr capsule Take 1 capsule (240 mg total) by mouth daily. 90 capsule 3   fluticasone furoate-vilanterol (BREO ELLIPTA) 200-25 MCG/INH AEPB Inhale 1 puff into the lungs daily. 1 each 5   furosemide (LASIX) 20 MG tablet Take 20 mg by mouth every other day.     MAGNESIUM PO Take 1 tablet by mouth every other day.  Melatonin 5 MG TABS Take 5 mg by mouth at bedtime as needed (for sleep).     Misc. Devices (PULSE OXIMETER FOR FINGER) MISC 1 Units by Does not apply route as needed. 1 each 0   Mouthwashes (BIOTENE DRY MOUTH GENTLE) LIQD Use as directed 1 Dose in the mouth or throat 2 (two) times daily as needed (dry mouth).     Omega-3 Fatty Acids (FISH OIL) 1000 MG CAPS Take 1,000 mg by mouth daily as needed (pt prefrence).     omeprazole (PRILOSEC) 40 MG capsule Take 1 capsule (40 mg total) by mouth daily as needed (heart burn).     oxyCODONE-acetaminophen (PERCOCET/ROXICET) 5-325 MG tablet Take 1 tablet by mouth every 4 (four) hours as needed for severe pain. 25 tablet 0   OXYGEN Inhale 1.5-2 L into the lungs See admin instructions. Uses at bedtime and through the day as needed     PROAIR HFA 108 (90 Base) MCG/ACT inhaler Inhale 2 puffs into the lungs every 4 (four) hours as needed for wheezing or shortness of breath. 18 g 3   rivaroxaban (XARELTO) 20 MG TABS tablet Take 1 tablet (20 mg total) by mouth daily. 90 tablet 0   senna (SENOKOT) 8.6 MG tablet Take 1 tablet by mouth daily as needed for constipation.     spironolactone (ALDACTONE) 25 MG tablet TAKE 1 TABLET(25 MG) BY MOUTH DAILY 90 tablet 1   SYRINGE/NEEDLE, DISP, 1 ML (B-D SYRINGE/NEEDLE 1CC/25GX5/8) 25G X 5/8" 1 ML MISC 1 Units by Does not apply route every 30 (thirty) days. 50 each 0   zinc gluconate 50 MG tablet Take 50 mg by mouth daily.     cloNIDine (CATAPRES) 0.1 MG tablet  TAKE 1 TABLET(0.1 MG) BY MOUTH EVERY 12 HOURS 180 tablet 2   No facility-administered medications prior to visit.     Review of Systems:   Constitutional:   No  weight loss, night sweats,  Fevers, chills, +fatigue, or  lassitude.  HEENT:   No headaches,  Difficulty swallowing,  Tooth/dental problems, or  Sore throat,                No sneezing, itching, ear ache,  +nasal congestion, post nasal drip,   CV:  No chest pain,  Orthopnea, PND, swelling in lower extremities, anasarca, dizziness, palpitations, syncope.   GI  No heartburn, indigestion, abdominal pain, nausea, vomiting, diarrhea, change in bowel habits, loss of appetite, bloody stools.   Resp:   No chest wall deformity  Skin: no rash or lesions.  GU: no dysuria, change in color of urine, no urgency or frequency.  No flank pain, no hematuria   MS:  No joint pain or swelling.  No decreased range of motion.  No back pain.    Physical Exam  BP 120/90 (BP Location: Left Arm, Patient Position: Sitting, Cuff Size: Normal)   Pulse 95   Temp 98.6 F (37 C)   Ht 5' 3.5" (1.613 m)   Wt 232 lb 12.8 oz (105.6 kg)   SpO2 (!) 89%   BMI 40.59 kg/m   GEN: A/Ox3; pleasant , NAD, well nourished    HEENT:  [place]/AT,  nl, NOSE-clear, THROAT-clear, no lesions, no postnasal drip or exudate noted.   NECK:  Supple w/ fair ROM; no JVD; normal carotid impulses w/o bruits; no thyromegaly or nodules palpated; no lymphadenopathy.    RESP  scattered rhonchi   no accessory muscle use, no dullness to percussion  CARD:  RRR, no  m/r/g, tr  peripheral edema, pulses intact, no cyanosis or clubbing.  GI:   Soft & nt; nml bowel sounds; no organomegaly or masses detected.   Musco: Warm bil, no deformities or joint swelling noted.   Neuro: alert, no focal deficits noted.    Skin: Warm, no lesions or rashes    Lab Results:  CBC  ProBNP No results found for: PROBNP  Imaging: CT CHEST WO CONTRAST  Result Date: 04/27/2021 CLINICAL DATA:   Non-small-cell lung cancer. EXAM: CT CHEST WITHOUT CONTRAST TECHNIQUE: Multidetector CT imaging of the chest was performed following the standard protocol without IV contrast. COMPARISON:  High-resolution chest CT 09/19/2020. FINDINGS: Cardiovascular: The heart size is normal. No substantial pericardial effusion. Coronary artery calcification is evident. Mild atherosclerotic calcification is noted in the wall of the thoracic aorta. Mediastinum/Nodes: No mediastinal lymphadenopathy. No evidence for gross hilar lymphadenopathy although assessment is limited by the lack of intravenous contrast on the current study. The esophagus has normal imaging features. There is no axillary lymphadenopathy. Lungs/Pleura: 2 mm right upper lobe nodule on 23/5 is stable in the interval. 2.1 x 1.8 cm mixed attenuation cystic and solid nodule in the right lower lobe on image 100/series 5 is not substantially changed from 2.2 x 1.6 cm (remeasured) previously interval development of parahilar volume loss and fibrosis in the left upper lobe in the region of fiducial markers, consistent with radiation scarring. The 2.6 x 2.9 cm lesion seen in this region previously has been incorporated into the area of scarring. No new suspicious pulmonary nodule or mass. No effusion. Upper Abdomen: Unremarkable. Tiny lesions of varying attenuation are seen in both kidneys, likely benign. Musculoskeletal: No worrisome lytic or sclerotic osseous abnormality. IMPRESSION: 1. Interval development of parahilar volume loss and fibrosis in the left upper lobe in the region of fiducial markers, consistent with radiation scarring. The 2.6 x 2.9 cm lesion seen in this region previously has been incorporated into the area of scarring. 2. No substantial change in the mixed attenuation cystic and solid nodule in the right lower lobe. Continued close attention on follow-up recommended. 3. Aortic Atherosclerosis (ICD10-I70.0). Electronically Signed   By: Misty Stanley  M.D.   On: 04/27/2021 13:14      PFT Results Latest Ref Rng & Units 12/04/2015  FVC-Predicted Pre % 49  FVC-Post L 1.45  FVC-Predicted Post % 55  Pre FEV1/FVC % % 50  Post FEV1/FCV % % 55  FEV1-Pre L 0.64  FEV1-Predicted Pre % 31  FEV1-Post L 0.80  DLCO uncorrected ml/min/mmHg 16.04  DLCO UNC% % 66  DLCO corrected ml/min/mmHg 16.29  DLCO COR %Predicted % 67  DLVA Predicted % 109    No results found for: NITRICOXIDE      Assessment & Plan:   Severe persistent asthma with (acute) exacerbation Mild flare with URI/Bronchitis  Zpack x 1   Plan  Patient Instructions  ZPack take as directed .  Mucinex DM Twice daily  As needed  Cough/congestion  Saline nasal spray and gel Twice daily.  Albuterol Neb Twice daily.  Continue on Oxygen 2l/m with activity and At bedtime  .  Albuterol Inhaler /  Neb every 4-6 hr as needed wheezing .  Activity as tolerated.  Order for POC (Innogen)  Follow up with Cardiology as planned.  Follow up with Dr. Elsworth Soho  In 5-months or Kaytlin Burklow NP.  Please contact office for sooner follow up if symptoms do not improve or worsen or seek emergency care  Adenocarcinoma of lung ([place]) Continue follow up with Rad/ONC.   Chronic respiratory failure with hypoxia (HCC) Continue on Oxygen 2l/m with activity and At bedtime   POC order   HIV disease ([place]) Continue follow up with ID .   Obesity (BMI 30-39.9) Healthy weight loss.   Atrial flutter ([place]) Stopped Xarelto , patient education given -declines restart  follow up with cards  Will send note to PCP and Cards     Rexene Edison, NP 05/12/2021

## 2021-05-12 NOTE — Assessment & Plan Note (Signed)
Mild flare with URI/Bronchitis  Zpack x 1   Plan  Patient Instructions  ZPack take as directed .  Mucinex DM Twice daily  As needed  Cough/congestion  Saline nasal spray and gel Twice daily.  Albuterol Neb Twice daily.  Continue on Oxygen 2l/m with activity and At bedtime  .  Albuterol Inhaler /  Neb every 4-6 hr as needed wheezing .  Activity as tolerated.  Order for POC (Innogen)  Follow up with Cardiology as planned.  Follow up with Dr. Elsworth Soho  In 44-months or Ronna Herskowitz NP.  Please contact office for sooner follow up if symptoms do not improve or worsen or seek emergency care

## 2021-05-12 NOTE — Assessment & Plan Note (Signed)
Stopped Xarelto , patient education given -declines restart  follow up with cards  Will send note to PCP and Cards

## 2021-05-12 NOTE — Assessment & Plan Note (Signed)
Healthy weight loss 

## 2021-05-12 NOTE — Assessment & Plan Note (Signed)
Continue follow up with Rad/ONC.

## 2021-05-13 NOTE — Telephone Encounter (Signed)
Called and spoke with pt and she is aware that ok per providers to change to VS.  Appt scheduled for / at 11 to establish care.

## 2021-05-13 NOTE — Telephone Encounter (Signed)
Okay with me 

## 2021-05-15 ENCOUNTER — Ambulatory Visit
Admission: RE | Admit: 2021-05-15 | Discharge: 2021-05-15 | Disposition: A | Discharge status: Home / Self Care | Payer: Medicare Other | Source: Ambulatory Visit | Attending: Radiation Oncology | Admitting: Radiation Oncology

## 2021-05-16 NOTE — Chronic Care Management (AMB) (Signed)
  Care Management   Note  05/16/2021 Name: Shannon [name] MRN: [id number] DOB: Jul 12, 1956  Shannon [name] is a 65 y.o. year old female who is a primary care patient of [name], MD and is actively engaged with the care management team. I reached out to [name] by phone today to assist with re-scheduling a follow up visit with the RN Case Manager  Follow up plan: Unsuccessful telephone outreach attempt made. A HIPAA compliant phone message was left for the patient providing contact information and requesting a return call.  The care management team will reach out to the patient again over the next 7 days.  If patient returns call to provider office, please advise to call [facility] at [phone].  [name]  Direct Dial: [phone]

## 2021-05-21 DIAGNOSIS — R269 Unspecified abnormalities of gait and mobility: Secondary | ICD-10-CM | POA: Diagnosis not present

## 2021-05-21 DIAGNOSIS — R911 Solitary pulmonary nodule: Secondary | ICD-10-CM | POA: Diagnosis not present

## 2021-05-21 DIAGNOSIS — J449 Chronic obstructive pulmonary disease, unspecified: Secondary | ICD-10-CM | POA: Diagnosis not present

## 2021-05-21 DIAGNOSIS — J45909 Unspecified asthma, uncomplicated: Secondary | ICD-10-CM | POA: Diagnosis not present

## 2021-05-23 NOTE — Chronic Care Management (AMB) (Signed)
  Care Management   Note  05/23/2021 Name: Shannon [name] MRN: [id number] DOB: 02-20-1956  Shannon [name] is a 65 y.o. year old female who is a primary care patient of [name], MD and is actively engaged with the care management team. I reached out to [name] by phone today to assist with re-scheduling a follow up visit with the RN Case Manager  Follow up plan: Telephone appointment with care management team member scheduled for:06/02/21  [facility]  Direct Dial: [phone]

## 2021-05-26 ENCOUNTER — Ambulatory Visit
Admission: RE | Admit: 2021-05-26 | Discharge: 2021-05-26 | Disposition: A | Discharge status: Home / Self Care | Payer: Medicare Other | Source: Ambulatory Visit | Attending: Radiation Oncology | Admitting: Radiation Oncology

## 2021-05-28 ENCOUNTER — Telehealth: Payer: Self-pay | Admitting: *Deleted

## 2021-05-28 NOTE — Telephone Encounter (Signed)
CALLED PATIENT TO ASK ABOUT RESCHEDULING FU APPT., RESCHEDULED FOR 06-16-21 @ 10 AM, PATIENT AGREED TO NEW DAY AND TIME

## 2021-06-02 ENCOUNTER — Ambulatory Visit: Payer: Medicare Other

## 2021-06-02 ENCOUNTER — Other Ambulatory Visit: Payer: Self-pay | Admitting: Internal Medicine

## 2021-06-02 ENCOUNTER — Telehealth: Payer: Medicare Other

## 2021-06-02 DIAGNOSIS — J9611 Chronic respiratory failure with hypoxia: Secondary | ICD-10-CM

## 2021-06-02 NOTE — Patient Instructions (Signed)
Visit Information   Goals Addressed             This Visit's Progress    Make and Keep All Appointments       Timeframe:  Long-Range Goal Priority:  High Start Date:         08/28/20                    Expected End Date:       05/23/21                Follow Up Date 07/23/21   - arrange a ride through an agency 1 week before appointment - ask family or friend for a ride - call to cancel if needed - keep a calendar with appointment dates    Why is this important?   Part of staying healthy is seeing the doctor for follow-up care.  If you forget your appointments, there are some things you can do to stay on track.    Notes: meeting goal     Manage Fatigue (Tiredness- Cancer Treatment)- patient diagnosed with lung cancer June 2021       Follow Up Date 07/23/21   - eat healthy - get a least 8 hours of sleep at night - use devices that will help like a cane, sock-puller or reacher - use meditation or relaxation techniques    Why is this important?   Cancer treatment and its side effects can drain your energy. It can keep you from doing things you would like to do.  There are many things that you can do to manage fatigue.    Notes:      Track and Manage My Symptoms-COPD/asthma       Timeframe:  Long-Range Goal Priority:  High Start Date:              08/28/20               Expected End Date:           ongoing           Follow Up Date 07/23/21   - eliminate symptom triggers at home - follow rescue plan if symptoms flare-up - keep follow-up appointments - use an extra pillow to sleep    Why is this important?   Tracking your symptoms and other information about your health helps your doctor plan your care.  Write down the symptoms, the time of day, what you were doing and what medicine you are taking.  You will soon learn how to manage your symptoms.     Notes:         The patient verbalized understanding of instructions, educational materials, and care plan  provided today and declined offer to receive copy of patient instructions, educational materials, and care plan.   Telephone follow up appointment with care management team member scheduled [id number] MD order for portable oxygen concentrator.  [name], RN, BSN, CCM Care Management Coordinator [facility] Internal Medicine Phone: [phone] / Fax: [phone]

## 2021-06-02 NOTE — Chronic Care Management (AMB) (Signed)
Care Management    RN Visit Note  06/02/2021 Name: Shannon [name] MRN: [id number] DOB: 12/07/1955  Subjective: Shannon [name] is a 65 y.o. year old female who is a primary care patient of [name], MD. The care management team was consulted for assistance with disease management and care coordination needs.    Engaged with patient by telephone for follow up visit in response to provider referral for case management and/or care coordination services.   Consent to Services:   Shannon [name] was given information about Care Management services today including:  Care Management services includes personalized support from designated clinical staff supervised by her physician, including individualized plan of care and coordination with other care providers 24/7 contact phone numbers for assistance for urgent and routine care needs. The patient may stop case management services at any time by phone call to the office staff.  Patient agreed to services and consent obtained.   Assessment: Patient continues to experience difficulty with dyspnea. Follow up Plan:  RNCM to contact patient after order for portable oxygen concentrator.   See Care Plan below for interventions and patient self-care actives. Review of patient past medical history, allergies, medications, health status, including review of consultants reports, laboratory and other test data, was performed as part of comprehensive evaluation and provision of chronic care management services.   SDOH (Social Determinants of Health) assessments and interventions performed:    Care Plan  Allergies  Allergen Reactions   Tree Extract Swelling and Other (See Comments)    Swelling to eyes   Augmentin [Amoxicillin-Pot Clavulanate] Other (See Comments)    Headache, dizzy   Lisinopril Cough    Face/throat swelling   Ciprofloxacin Hives    Outpatient Encounter Medications as of 06/02/2021  Medication Sig   acetaminophen (TYLENOL) 500 MG  tablet Take 2 tablets (1,000 mg total) by mouth every 6 (six) hours as needed.   albuterol (PROVENTIL) (2.5 MG/3ML) 0.083% nebulizer solution Take 3 mLs (2.5 mg total) by nebulization every 4 (four) hours as needed for wheezing or shortness of breath.   Ascorbic Acid (VITAMIN C) 1000 MG tablet Take 1,000 mg by mouth every other day.    b complex vitamins tablet Take 1 tablet by mouth daily.   BIKTARVY 50-200-25 MG TABS tablet TAKE 1 TABLET BY MOUTH DAILY   BIOTIN PO Take 1 tablet by mouth daily as needed (for supplementation).    calcium carbonate (OSCAL) 1500 (600 Ca) MG TABS tablet Take 600 mg of elemental calcium by mouth daily with breakfast.   Cholecalciferol (SM VITAMIN D3) 100 MCG (4000 UT) CAPS Take 1 capsule (4,000 Units total) by mouth daily.   cloNIDine (CATAPRES) 0.1 MG tablet TAKE 1 TABLET(0.1 MG) BY MOUTH EVERY 12 HOURS   cyanocobalamin (,VITAMIN B-12,) 1000 MCG/ML injection ADMINISTER 1 ML(1000 MCG) IN THE MUSCLE EVERY 30 DAYS   cyclobenzaprine (FLEXERIL) 5 MG tablet Take 1 tablet (5 mg total) by mouth 3 (three) times daily as needed for muscle spasms. Start taking at night to see how they affect you.   diclofenac Sodium (VOLTAREN) 1 % GEL Apply 4 g topically 4 (four) times daily.   diltiazem (CARDIZEM CD) 240 MG 24 hr capsule Take 1 capsule (240 mg total) by mouth daily.   fluticasone furoate-vilanterol (BREO ELLIPTA) 200-25 MCG/INH AEPB Inhale 1 puff into the lungs daily.   furosemide (LASIX) 20 MG tablet Take 20 mg by mouth every other day.   MAGNESIUM PO Take 1 tablet by mouth  every other day.    Melatonin 5 MG TABS Take 5 mg by mouth at bedtime as needed (for sleep).   Misc. Devices (PULSE OXIMETER FOR FINGER) MISC 1 Units by Does not apply route as needed.   Mouthwashes (BIOTENE DRY MOUTH GENTLE) LIQD Use as directed 1 Dose in the mouth or throat 2 (two) times daily as needed (dry mouth).   Omega-3 Fatty Acids (FISH OIL) 1000 MG CAPS Take 1,000 mg by mouth daily as needed (pt  prefrence).   omeprazole (PRILOSEC) 40 MG capsule Take 1 capsule (40 mg total) by mouth daily as needed (heart burn).   oxyCODONE-acetaminophen (PERCOCET/ROXICET) 5-325 MG tablet Take 1 tablet by mouth every 4 (four) hours as needed for severe pain.   OXYGEN Inhale 1.5-2 L into the lungs See admin instructions. Uses at bedtime and through the day as needed   PROAIR HFA 108 (90 Base) MCG/ACT inhaler Inhale 2 puffs into the lungs every 4 (four) hours as needed for wheezing or shortness of breath.   rivaroxaban (XARELTO) 20 MG TABS tablet Take 1 tablet (20 mg total) by mouth daily.   senna (SENOKOT) 8.6 MG tablet Take 1 tablet by mouth daily as needed for constipation.   spironolactone (ALDACTONE) 25 MG tablet TAKE 1 TABLET(25 MG) BY MOUTH DAILY   SYRINGE/NEEDLE, DISP, 1 ML (B-D SYRINGE/NEEDLE 1CC/25GX5/8) 25G X 5/8" 1 ML MISC 1 Units by Does not apply route every 30 (thirty) days.   zinc gluconate 50 MG tablet Take 50 mg by mouth daily.   No facility-administered encounter medications on file as of 06/02/2021.    Patient Active Problem List   Diagnosis Date Noted   Intercostal muscle strain 04/14/2021   Rib fracture 03/12/2021   Osteoporosis 03/12/2021   Pyelonephritis 02/27/2021   Panniculitis 01/23/2021   Healthcare maintenance 01/23/2021   On supplemental oxygen therapy 11/19/2020   Insomnia 11/14/2020   Burn erythema of breast, initial encounter 11/05/2020   Skin ulcer ([place]) 06/24/2020   Bronchitis 03/07/2020   Pulmonary nodule 02/14/2020   Nodule of upper lobe of left lung 02/06/2020   Thyroid nodule 01/26/2020   Adenocarcinoma of lung ([place]) 01/04/2020   Chronic obstructive asthma ([place]) 12/29/2019   (HFpEF) heart failure with preserved ejection fraction ([place]) 02/07/2019   Vitamin D deficiency 06/30/2018   Chronic anticoagulation 01/03/2018   Chronic respiratory failure with hypoxia ([place]) 11/30/2017   Atrial flutter ([place]) 03/11/2017   CAD (coronary artery disease) 01/28/2017    Obesity (BMI 30-39.9) 01/13/2017   Ascending aorta dilatation ([place]) 01/07/2017   Low back pain radiating to right lower extremity 12/02/2016   Acquired cyst of kidney 12/02/2016   Chronic kidney disease (CKD), stage III (moderate) (HCC) 12/01/2016   Generalized anxiety disorder 10/22/2016   Hypersomnia 10/01/2016   Accessory skin tags 06/10/2016   Nocturnal hypoxemia 05/13/2016   Cervical radiculopathy 04/14/2016   GERD (gastroesophageal reflux disease) 01/02/2016   Morbid (severe) obesity due to excess calories ([place]) 10/10/2015   Vitamin B12 deficiency 10/02/2015   Severe persistent asthma with (acute) exacerbation 07/09/2015   HIV disease ([place]) 07/09/2015   Uncontrolled hypertension 07/09/2015    Conditions to be addressed/monitored: CAD, HTN, and COPD  Care Plan : CCM RN- Cancer Treatment Phase (Adult)- patient diagnosed with lung cancer June 2021  Updates made by [name], RN since 06/02/2021 12:00 AM     Problem: Patient lives alone with no family nearby with recent  diagnosis of lung cancer ( June 2021) with numerous chronic disease states  Priority: High  Onset Date: 08/28/2020     Goal: Disease Progression Minimized or Managed   Start Date: 08/28/2020  Expected End Date: 05/23/2021  Recent Progress: On track  Priority: High  Note:   Current Barriers:   Chronic Disease Management support and education needs related to [facility], CAD, HIV,  lung Ca diagnosed June 2021 Unable to independently manage chronic disease states- Successful outreach to patient via phone to complete follow up assessment.  Patient is using 1.5L O2 at all times.  She is requesting a portable oxygen concentrator.  Patient has a portable oxygen tank but she notes it is too heavy for her.  She has an order in the system for a oxygen concentrator but not a portable concentrator.  Collaboration with [place] team and Dr. Daryll Drown to request an order for a portable oxygen concentrator.   Informed patient this RNCM will follow up with her after order obtained and [employer] notified of request.    Nurse Case Manager Clinical Goal(s):  Over the next 30-60 days, patient will attend all scheduled medical appointments: related to chronic disease states Over the next 30-60  days, the patient will demonstrate ongoing self health care management ability as evidenced by no unplanned hospital admissions   Interventions:  1:1 collaboration with [name], MD regarding development and update of comprehensive plan of care as evidenced by provider attestation and co-signature Inter-disciplinary care team collaboration (see longitudinal plan of care) Evaluation of current treatment plan related to chronic disease states and patient's adherence to plan as established by provider. Assessed medication taking behavior Positive reinforcement given  to patient for plans to enroll in school and joining exercise programs at 2 facilities Discussed plans with patient for ongoing care management follow up and provided patient with direct contact information for care management team  Patient Goals/Self-Care Activities Over the next 30-60 days, patient will:  - Patient will self administer medications as prescribed Patient will attend all scheduled provider appointments: Reviewed upcoming appointments 06/06/21@0915  w/ Dr. Daryll Drown Patient will call pharmacy for medication refills Patient will continue to perform ADL's independently Patient will continue to perform IADL's independently Patient will call provider office for new concerns or questions - eliminate COPD/Asthma symptom triggers at home - follow rescue plan if symptoms flare-up - keep follow-up appointments - use an extra pillow to sleep - arrange a ride through an agency 1 week before appointment - ask family or friend for a ride - call to cancel if needed - keep a calendar with appointment dates - eat healthy - get a least 8 hours of  sleep at night - use devices that will help like a cane, sock-puller or reacher - use meditation or relaxation techniques - wash my hands with mild soap and water - wash my hands after using the bathroom - wash my hands after coughing, sneezing or blowing nose - stay away from people who are sick - talk about feelings with a friend, family or spiritual advisor - practice positive thinking and self-talk  Follow Up Plan: The care management team will reach out to the patient again over the next 30-60 days.         Plan: Telephone follow up appointment with care management team member scheduled for:  Pending order for portable oxygen concentrator.  [name], RN, BSN, CCM Care Management Coordinator [facility] Phone: [phone] / Fax: [phone]

## 2021-06-05 ENCOUNTER — Ambulatory Visit: Payer: Medicare Other | Admitting: Internal Medicine

## 2021-06-05 DIAGNOSIS — J449 Chronic obstructive pulmonary disease, unspecified: Secondary | ICD-10-CM | POA: Diagnosis not present

## 2021-06-06 ENCOUNTER — Encounter: Payer: Medicare Other | Admitting: Internal Medicine

## 2021-06-11 ENCOUNTER — Encounter: Payer: Self-pay | Admitting: Student

## 2021-06-11 ENCOUNTER — Ambulatory Visit (INDEPENDENT_AMBULATORY_CARE_PROVIDER_SITE_OTHER): Payer: Medicare Other | Admitting: Student

## 2021-06-11 ENCOUNTER — Encounter: Payer: Medicare Other | Admitting: Student

## 2021-06-11 ENCOUNTER — Other Ambulatory Visit: Payer: Self-pay

## 2021-06-11 DIAGNOSIS — J40 Bronchitis, not specified as acute or chronic: Secondary | ICD-10-CM | POA: Diagnosis not present

## 2021-06-11 DIAGNOSIS — J449 Chronic obstructive pulmonary disease, unspecified: Secondary | ICD-10-CM | POA: Diagnosis not present

## 2021-06-11 DIAGNOSIS — J4489 Other specified chronic obstructive pulmonary disease: Secondary | ICD-10-CM

## 2021-06-11 MED ORDER — LEVOFLOXACIN 750 MG PO TABS: 750.0000 mg | ORAL_TABLET | Freq: Every day | ORAL | 0 refills | Status: AC

## 2021-06-11 MED ORDER — PROAIR HFA 108 (90 BASE) MCG/ACT IN AERS: 2.0000 | INHALATION_SPRAY | RESPIRATORY_TRACT | 3 refills | Status: DC | PRN

## 2021-06-11 NOTE — Patient Instructions (Signed)
Thank you, [name] for allowing [place] to provide your care today. Today we discussed your respiratory symptoms.  We think you likely have bacterial bronchitis or community-acquired pneumonia.  We are sending you antibiotics to take we recommend he follow-up with pulmonology on Friday.  I have ordered the following medication/changed the following medications:  Start levofloxacin 750 mg daily for 7 days  My Chart Access: [email or web address]  Please follow-up if symptoms worsen or as needed.  Please make sure to arrive 15 minutes prior to your next appointment. If you arrive late, you may be asked to reschedule.    We look forward to seeing you next time. Please call our clinic at [phone] if you have any questions or concerns. The best time to call is Monday-Friday from 9am-4pm, but there is someone available 24/7. If after hours or the weekend, call the main hospital number and ask for the Internal Medicine Resident On-Call. If you need medication refills, please notify your pharmacy one week in advance and they will send [place] a request.   Thank you for letting us take part in your care. Wishing you the best!  Lacinda Axon, MD 06/11/2021, 3:41 PM IM Resident, PGY-2 [facility] 41:10

## 2021-06-11 NOTE — Assessment & Plan Note (Addendum)
Patient presented for televisit with a complaint of not feeling well overall.  Patient reports that she has been sick since last week. She has had increased cough with sputum described as yellow-green in color. She has had associated chills, headaches, generalized weakness, chest congestions and shortness of breath. Patient has been using her albuterol inhaler more often than usual this last week. She has tried Mucinex, nasal rinses, OTC NSAIDs and her albuterol without significant improvement. Patient reported that she checks her O2 at home and today, it was down to 92%, with heart rate in the upper 90s and SBP in the 160s.  Of note, patient was seen by provider at [facility] pulmonology on 9/19 for similar presentation. She was prescribed a Z-Pak and advised to continue symptomatic management.  Her symptoms did not completely resolve and she believes it has been worse the past week.  A/P: Patient is a 65 year old woman with a history of adenocarcinoma of the lung s/p radiation with residual radiation fibrosis, severe persistent chronic obstructive asthma, chronic hypoxic respiratory failure on oxygen 2L with activity and at bedtime asthma and HIV who is calling in for evaluation of worsening respiratory symptoms. Patient was recently treated for bronchitis a month ago with 5-day course of azithromycin. Symptoms have not resolved and continues to worsen. Patient's underlying lung disease places her at high risk for viral and bacterial infections.  Due to the lack of improvement in symptoms after symptomatic management plus a Z-Pak, patient's symptoms more concerning for bacterial infection such as community-acquired pneumonia rather than a simple viral bronchitis.  Patient is over the age of 22, has recent antibiotics use in the last month and has multiple comorbidities so she requires expanded coverage for her infection. Patient reports that she did not tolerate Augmentin in the past.  Plan to prescribe a  respiratory floroquinolone and have patient follow-up with pulmonology later this week for in-person evaluation. -- Levofloxacin 750 mg daily for 7 days -- Strongly advised patient to attend pulmonology appointment on 10/21 with Dr. Halford Chessman -- Continue inhalers and as needed albuterol, refilled ProAir today -- Continue Mucinex, saline rinses, OTC NSAIDs

## 2021-06-11 NOTE — Progress Notes (Signed)
   CC: Productive cough, chest congestion  This is a telephone encounter between Shannon [name] and Lacinda Axon on 06/11/2021 for productive cough and chest congestion. The visit was conducted with the patient located at home and Lacinda Axon at [facility]. The patient's identity was confirmed using their DOB and current address. The patient has consented to being evaluated through a telephone encounter and understands the associated risks (an examination cannot be done and the patient may need to come in for an appointment) / benefits (allows the patient to remain at home, decreasing exposure to coronavirus). I personally spent 21 minutes on medical discussion.   HPI:  [name] is a 65 y.o. with PMH as below. Please see A&P for assessment of the patient's acute and chronic medical conditions.   Past Medical History:  Diagnosis Date   Anemia    Anxiety    HX PANIC ATTACKS   Arthritis    "starting to; in my hands" (07/09/2015)   Asthma    Atrial fibrillation (HCC)    Atrial flutter, paroxysmal (HCC)    Bloated abdomen    CFS (chronic fatigue syndrome)    Chewing difficulty    Chronic asthma with acute exacerbation    "I have chronic asthma all the time; sometimes exacerbations" (07/09/2015)   Chronic lower back pain    COPD (chronic obstructive pulmonary disease) ([place])    Cyst of right kidney    "3 of them; dx'd in ~ 01/2015"   Dyspnea    GERD (gastroesophageal reflux disease)    Heart murmur    History of blood transfusion    "related to my brain surgery I think"   History of pulmonary embolism 07/09/2015   History of radiation therapy 04/09/20-04/22/20   SBRT Left Lung, Dr. Gery Pray   HIV antibody positive ([place])    HIV disease ([place])    Hyperlipidemia    Hypertension    Leg edema    Lipodystrophy    Mild CAD 2013   Multiple thyroid nodules    Osteopenia    Palpitations    Pneumonia 07/09/2015   Shingles    Sleep apnea    "never completed part 2 of  study; never wore mask" (07/09/2015)   Vitamin B 12 deficiency    Vitamin D deficiency    Review of Systems: Positive for chills, productive cough of yellowish-greenish sputum, shortness of breath, headaches, chest congestion and sinus congestion. Negative for fever, chest pain or palpitations.   Assessment & Plan:   See Encounters Tab for problem based charting.  Patient discussed with Dr. Angelia Mould  Linwood Dibbles, MD, MPH

## 2021-06-13 ENCOUNTER — Ambulatory Visit: Payer: Medicare Other | Admitting: Pulmonary Disease

## 2021-06-13 ENCOUNTER — Encounter: Payer: Medicare Other | Admitting: Student

## 2021-06-13 ENCOUNTER — Encounter: Payer: Self-pay | Admitting: Pulmonary Disease

## 2021-06-13 ENCOUNTER — Ambulatory Visit (INDEPENDENT_AMBULATORY_CARE_PROVIDER_SITE_OTHER): Payer: Medicare Other | Admitting: Pulmonary Disease

## 2021-06-13 ENCOUNTER — Other Ambulatory Visit: Payer: Self-pay

## 2021-06-13 VITALS — BP 150/84 | HR 92 | Temp 98.3°F | Ht 63.0 in | Wt 235.0 lb

## 2021-06-13 DIAGNOSIS — J9611 Chronic respiratory failure with hypoxia: Secondary | ICD-10-CM | POA: Diagnosis not present

## 2021-06-13 DIAGNOSIS — C3492 Malignant neoplasm of unspecified part of left bronchus or lung: Secondary | ICD-10-CM

## 2021-06-13 DIAGNOSIS — J4551 Severe persistent asthma with (acute) exacerbation: Secondary | ICD-10-CM

## 2021-06-13 NOTE — Progress Notes (Signed)
[facility]  Chief Complaint  Patient presents with   New Patient (Initial Visit)    Asthma    Past Surgical History:  She  has a past surgical history that includes Cardiac catheterization; Tonsillectomy and adenoidectomy; Abdominal hysterectomy; Brain surgery (1974); Brain surgery; Video bronchoscopy with endobronchial navigation (N/A, 02/13/2020); Bronchial brushings (02/13/2020); Bronchial needle aspiration biopsy (02/13/2020); Bronchial biopsy (02/13/2020); and Fiducial marker placement (02/13/2020).  Past Medical History:  Stage 1a NSCLC (adenocarcinoma) June 2021 s/p SBRT, HIV, Lipodystrophy, A flutter, CKD 3a, Anxiety, OA, Chronic fatigue, Back pain, GERD, PE 2016, HLD, HTN, CAD, Multinodular thyroid, Osteopenia, Shingles, Vit B12 deficiency, Vit D deficiency  Constitutional:  BP (!) 150/84 (BP Location: Left Arm, Cuff Size: Normal)   Pulse 92   Temp 98.3 F (36.8 C) (Oral)   Ht 5\' 3"  (1.6 m)   Wt 235 lb (106.6 kg)   SpO2 93%   BMI 41.63 kg/m   Brief Summary:  Shannon [name] is a 65 y.o. female with chronic obstructive allergic asthma and chronic respiratory failure with hypoxia.      Subjective:   Previously seen by Dr. Elsworth Soho.  Saw Tammy Parrett in September.  Treated for an exacerbation with a Zpak.  Still had some cough and congestion.  No fever, wheeze, or chest pain.  She tries to stay active.  In school at [employer].  Lives alone.  She is concerned about taking too many medications.  She is trying to get healthier through exercise and diet modification.  Uses 2 to 3 liters oxygen at night and sometimes with activity.  She used breo before but caused her to gag.  Uses albuterol couple times per week.  She got script for levaquin earlier this week, but hasn't started taking it yet.  Physical Exam:   Appearance - well kempt   ENMT - no sinus tenderness, no oral exudate, no LAN, Mallampati 3 airway, no stridor, poor  dentition  Respiratory - equal breath sounds bilaterally, no wheezing or rales  CV - s1s2 regular rate and rhythm, 2/6 murmurs  Ext - no clubbing, no edema  Skin - no rashes  Psych - normal mood and affect   Pulmonary testing:  PFT 12/04/15 >> FEV1 0.80 (39%), FEV1% 55, TLC 3.58 (70%), DLCO 66%, +BD A1AT 03/27/16 >> 133, MM Allergy testing 12/27/17 >> white alder, oak, cottonwood, maple  Chest Imaging:  CT chest 04/27/21 >> 2 mm RUL nodule stable, 2.1 x 1.8 cm cystic and solid nodule RLL no change, perihilar volume loss and fibrosis LUL  Sleep Tests:  PSG 05/11/16 >> AHI 2.5, SpO2 low 84%; needed 1 liter O2  Cardiac Tests:  Echo 11/15/19 >> EF 60 to 65%, mild LVH, grade 1 DD, RVSP 25.1 mmHg  Social History:  She  reports that she has never smoked. She has never used smokeless tobacco. She reports that she does not currently use alcohol. She reports that she does not use drugs.  Family History:  Her family history includes Asthma in her mother; Diabetes in her sister; Heart disease in her mother; Heart failure in her mother; Heart murmur in her brother, sister, and sister; Hypertension in her mother; Obesity in her mother; Thyroid disease in her mother and sister.     Assessment/Plan:   COPD from allergic asthma. - she is intolerant of breo - continue prn albuterol - don't think she needs additional antibiotics at this time  Chronic respiratory failure with hypoxia from COPD  and obesity hypoventilation syndrome. - continue 2 to 3 liters oxygen at night and with activity - she is going to contact [facility] weight loss program  Chronic diastolic CHF, Atrial flutter. - followed by Dr. Skeet Latch with [place]  HIV. - followed by Dr. [name] with Infectious Disease  Stage 1a NSCLC of LUL. - followed by Dr. Curt Bears with Oncology  Time Spent Involved in Patient Care on Day of Examination:  44 minutes  Follow up:   Patient Instructions  Follow up  in 3 months  Medication List:   Allergies as of 06/13/2021       Reactions   Tree Extract Swelling, Other (See Comments)   Swelling to eyes   Augmentin [amoxicillin-pot Clavulanate] Other (See Comments)   Headache, dizzy   Lisinopril Cough   Face/throat swelling   Ciprofloxacin Hives        Medication List        Accurate as of June 13, 2021 12:29 PM. If you have any questions, ask your nurse or doctor.          STOP taking these medications    cloNIDine 0.1 MG tablet Commonly known as: CATAPRES Stopped by: Chesley Mires, MD   cyclobenzaprine 5 MG tablet Commonly known as: FLEXERIL Stopped by: Chesley Mires, MD   fluticasone furoate-vilanterol 200-25 MCG/INH Aepb Commonly known as: BREO ELLIPTA Stopped by: Chesley Mires, MD   oxyCODONE-acetaminophen 5-325 MG tablet Commonly known as: PERCOCET/ROXICET Stopped by: Chesley Mires, MD   spironolactone 25 MG tablet Commonly known as: ALDACTONE Stopped by: Chesley Mires, MD       TAKE these medications    acetaminophen 500 MG tablet Commonly known as: TYLENOL Take 2 tablets (1,000 mg total) by mouth every 6 (six) hours as needed.   albuterol (2.5 MG/3ML) 0.083% nebulizer solution Commonly known as: PROVENTIL Take 3 mLs (2.5 mg total) by nebulization every 4 (four) hours as needed for wheezing or shortness of breath.   ProAir HFA 108 (90 Base) MCG/ACT inhaler Generic drug: albuterol Inhale 2 puffs into the lungs every 4 (four) hours as needed for wheezing or shortness of breath.   b complex vitamins tablet Take 1 tablet by mouth daily.   B-D SYRINGE/NEEDLE 1CC/25GX5/8 25G X 5/8" 1 ML Misc Generic drug: SYRINGE/NEEDLE (DISP) 1 ML 1 Units by Does not apply route every 30 (thirty) days.   Biktarvy 50-200-25 MG Tabs tablet Generic drug: bictegravir-emtricitabine-tenofovir AF TAKE 1 TABLET BY MOUTH DAILY   Biotene Dry Mouth Gentle Liqd Use as directed 1 Dose in the mouth or throat 2 (two) times daily as  needed (dry mouth).   BIOTIN PO Take 1 tablet by mouth daily as needed (for supplementation).   calcium carbonate 1500 (600 Ca) MG Tabs tablet Commonly known as: OSCAL Take 600 mg of elemental calcium by mouth daily with breakfast.   cyanocobalamin 1000 MCG/ML injection Commonly known as: (VITAMIN B-12) ADMINISTER 1 ML(1000 MCG) IN THE MUSCLE EVERY 30 DAYS   diclofenac Sodium 1 % Gel Commonly known as: Voltaren Apply 4 g topically 4 (four) times daily.   diltiazem 240 MG 24 hr capsule Commonly known as: CARDIZEM CD Take 1 capsule (240 mg total) by mouth daily.   Fish Oil 1000 MG Caps Take 1,000 mg by mouth daily as needed (pt prefrence).   furosemide 20 MG tablet Commonly known as: LASIX Take 20 mg by mouth every other day.   levofloxacin 750 MG tablet Commonly known as: Levaquin Take 1  tablet (750 mg total) by mouth daily for 7 days.   MAGNESIUM PO Take 1 tablet by mouth every other day.   melatonin 5 MG Tabs Take 5 mg by mouth at bedtime as needed (for sleep).   omeprazole 40 MG capsule Commonly known as: PRILOSEC Take 1 capsule (40 mg total) by mouth daily as needed (heart burn).   OXYGEN Inhale 1.5-2 L into the lungs See admin instructions. Uses at bedtime and through the day as needed   Pulse Oximeter For Finger Misc 1 Units by Does not apply route as needed.   rivaroxaban 20 MG Tabs tablet Commonly known as: Xarelto Take 1 tablet (20 mg total) by mouth daily.   senna 8.6 MG tablet Commonly known as: SENOKOT Take 1 tablet by mouth daily as needed for constipation.   SM Vitamin D3 100 MCG (4000 UT) Caps Generic drug: Cholecalciferol Take 1 capsule (4,000 Units total) by mouth daily.   vitamin C 1000 MG tablet Take 1,000 mg by mouth every other day.   zinc gluconate 50 MG tablet Take 50 mg by mouth daily.        Signature:  Chesley Mires, MD [facility] Pager - [phone] 06/13/2021, 12:29 PM

## 2021-06-13 NOTE — Patient Instructions (Signed)
Follow up in 3 months
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
## 2021-06-16 ENCOUNTER — Telehealth: Payer: Self-pay | Admitting: *Deleted

## 2021-06-16 ENCOUNTER — Ambulatory Visit
Admission: RE | Admit: 2021-06-16 | Discharge: 2021-06-16 | Disposition: A | Discharge status: Home / Self Care | Payer: Medicare Other | Source: Ambulatory Visit | Attending: Radiation Oncology | Admitting: Radiation Oncology

## 2021-06-16 NOTE — Telephone Encounter (Signed)
RETURNED PATIENT'S PHONE CALL, SPOKE WITH PATIENT. ?

## 2021-06-17 NOTE — Progress Notes (Signed)
Internal Medicine Clinic Attending  Case discussed with Dr. Amponsah  At the time of the visit.  We reviewed the resident's history and exam and pertinent patient test results.  I agree with the assessment, diagnosis, and plan of care documented in the resident's note.  

## 2021-06-20 DIAGNOSIS — J45909 Unspecified asthma, uncomplicated: Secondary | ICD-10-CM | POA: Diagnosis not present

## 2021-06-20 DIAGNOSIS — J449 Chronic obstructive pulmonary disease, unspecified: Secondary | ICD-10-CM | POA: Diagnosis not present

## 2021-06-20 DIAGNOSIS — R911 Solitary pulmonary nodule: Secondary | ICD-10-CM | POA: Diagnosis not present

## 2021-06-20 DIAGNOSIS — R269 Unspecified abnormalities of gait and mobility: Secondary | ICD-10-CM | POA: Diagnosis not present

## 2021-06-24 ENCOUNTER — Ambulatory Visit: Payer: Medicare Other | Admitting: Sports Medicine

## 2021-06-26 ENCOUNTER — Ambulatory Visit: Payer: Medicare Other | Admitting: Radiation Oncology

## 2021-06-30 ENCOUNTER — Ambulatory Visit: Payer: Medicare Other | Admitting: Internal Medicine

## 2021-06-30 ENCOUNTER — Other Ambulatory Visit: Payer: Self-pay

## 2021-06-30 ENCOUNTER — Encounter (HOSPITAL_BASED_OUTPATIENT_CLINIC_OR_DEPARTMENT_OTHER): Payer: Self-pay | Admitting: Cardiovascular Disease

## 2021-06-30 ENCOUNTER — Ambulatory Visit: Payer: Medicare Other | Admitting: Pulmonary Disease

## 2021-06-30 ENCOUNTER — Ambulatory Visit (INDEPENDENT_AMBULATORY_CARE_PROVIDER_SITE_OTHER): Payer: Medicare Other | Admitting: Cardiovascular Disease

## 2021-06-30 VITALS — BP 162/106 | HR 103 | Ht 63.0 in | Wt 234.9 lb

## 2021-06-30 DIAGNOSIS — I2510 Atherosclerotic heart disease of native coronary artery without angina pectoris: Secondary | ICD-10-CM | POA: Diagnosis not present

## 2021-06-30 DIAGNOSIS — I483 Typical atrial flutter: Secondary | ICD-10-CM | POA: Diagnosis not present

## 2021-06-30 DIAGNOSIS — I5032 Chronic diastolic (congestive) heart failure: Secondary | ICD-10-CM

## 2021-06-30 DIAGNOSIS — Z5181 Encounter for therapeutic drug level monitoring: Secondary | ICD-10-CM | POA: Diagnosis not present

## 2021-06-30 DIAGNOSIS — I77810 Thoracic aortic ectasia: Secondary | ICD-10-CM

## 2021-06-30 DIAGNOSIS — I10 Essential (primary) hypertension: Secondary | ICD-10-CM

## 2021-06-30 DIAGNOSIS — R0602 Shortness of breath: Secondary | ICD-10-CM | POA: Diagnosis not present

## 2021-06-30 MED ORDER — SPIRONOLACTONE 25 MG PO TABS: 25.0000 mg | ORAL_TABLET | Freq: Every day | ORAL | 0 refills | Status: DC

## 2021-06-30 MED ORDER — RIVAROXABAN 20 MG PO TABS: 20.0000 mg | ORAL_TABLET | Freq: Every day | ORAL | 0 refills | Status: DC

## 2021-06-30 MED ORDER — FUROSEMIDE 20 MG PO TABS: 20.0000 mg | ORAL_TABLET | Freq: Every day | ORAL | 0 refills | Status: DC

## 2021-06-30 NOTE — Progress Notes (Signed)
Cardiology Office Note   Date:  06/30/2021   ID:  Shannon [name], DOB Jan 03, 1956, MRN [id number]  PCP:  [name], MD  Cardiologist:  Skeet Latch, MD  Electrophysiologist:  None   Evaluation Performed:  Follow-Up Visit  Chief Complaint:    History of Present Illness:    Shannon [name] is a 65 y.o. female with stage IA (T1c, N0, M0) non-small cell lung cancer starting XRT, hypertension, paroxysmal atrial flutter, asthma, medication non-compliance, non-obstructive CAD, OSA, prior PE, moderate pulmonary hypertension, mild ascending aorta aneurysm, PAD, and HIV (well-controlled)  who presents via video conferencing for a telehealth visit today.  Shannon [name] was initially referred by [name], MD, on 12/26/15. [name] was concerned about her lower extremity edema and shortness of breath.  She was started on lasix 20 mg every other day.  Shannon [name] noted shortness of breath after a hospitalization for pneumonia 06/2015.   She denied lower extremity edema or orthopnea but did report chest pain when laying down at night.  She was referred for an echo 01/2016 that revealed LVEF 60-65% awith grade 1 diastolic dysfunction and hypokinesis of the basal inferior wall.  She also had very mild aortic stenosis with a mean gradient of 9 mmHg and trivial AR.  PASP was 52 mmHg.  She had a Lexiscan Myoview at that time that was negative for ischemia.  Shannon [name] previously had a Lexiscan Cardiolite in September 2013 that revealed a medium, mild reversible defect in the anterior wall and normal systolic function. She subsequently underwent cardiac catheterization that revealed nonobstructive coronary disease. She had mild pulmonary hypertension and a normal cardiac output. Left ventriculography revealed mild to moderate aortic regurgitation.  She had a 24-hour Holter in February 2015 that revealed rare PACs and PVCs.  Echo 01/06/17 revealed LVEF 60-65% with mild LVH and a mean aortic valve gradient of 8 mmHg.   Tricuspid regurgitation was not significant enough to assess pulmonary pressures.  Repeat echo 01/2019 revealed LVEF >65%, severe LVH, intracavitary gradient 61 mmHg, mild AS (mean gradient 13 mmHg), mild-moderate AR, and mild ascending aorta aneurysm (4.1 cm).   Shannon [name] was admitted to the hospital 01/25/17 with hypertensive emergency and chest pain. Her blood pressure was 215/100.  She consistently struggles with mediation adherence and has strong feelings about what medicines she should take and when she should take them.  Shannon [name] [facility] and has received a lot of help there but hasn't been going lately 2/2 COVID-19.  Lisinopril was stopped due to cough and throat swelling.  Shannon [name] reported claudication and had ABIs performed by her PCP, Dr. Daryll Drown.  ABI/Dopplers 02/2019 revealed moderate R LE arterial disease and mild L LE disease.  She was referred to our vascular specialists but prefers to keep seeing me.    Shannon [name]'s clonidine was increased to 0.3 mg weekly.  Prior to the vaccine her BP was running in the 130s-140s.  She decided to stop taking her clonidine for a few days before she got her vaccine because she did not know how it would affect her.  She had a repeat echocardiogram 10/2019 that revealed LVEF 60 to 65% with mild LVH and grade 1 diastolic dysfunction.  The ascending aorta was normal in size.  Shannon [name] was diagnosed with Stage 1 non-small cell lung cancer.  She was not a surgical candidate.  She completed 5 sessions of XRT.  She has been  managed by IM and pulmonary 05/2021 for a respiratory infection.  Since she was last seen she has missed or cancelled many appointments.  Her HIV has been controlled on Biktarvy.  Since she was last seen she woke up with severe pain and was found to be a fracture in her rib.  It was thought to be due to a cough and osteoporosis.  She stopped taking Xarelto due to concern that it was the cause.  The MRI abdomen  showed atherosclerosis of the aorta.  She had her teeth pulled and has a hard time with her bite.  She is getting dentures soon.  She stopped taking her medication but can't explain why.  She lost weight but is now gaining weight due to fluid.  She was enrolled  at [facility] but had to stop because she gets short of breath walking around campus.  She has been eating salty foods and feeling thirsty so she drinks more and retains fluid.   Prior CV studies:    The following studies were reviewed today:  Echo 01/06/17: Study Conclusions   - Left ventricle: The cavity size was normal. Wall thickness was   increased in a pattern of mild LVH. Systolic function was normal.   The estimated ejection fraction was in the range of 60% to 65%.   Wall motion was normal; there were no regional wall motion   abnormalities. Doppler parameters are consistent with abnormal   left ventricular relaxation (grade 1 diastolic dysfunction). - Aortic valve: Trileaflet; moderately calcified leaflets.   Sclerosis without stenosis. There was mild to moderate   regurgitation. Mean gradient (S): 8 mm Hg. - Mitral valve: There was no significant regurgitation. - Left atrium: The atrium was moderately dilated. - Right ventricle: The cavity size was normal. Systolic function   was normal. - Right atrium: The atrium was mildly dilated. - Pulmonary arteries: No complete TR doppler jet so unable to   estimate PA systolic pressure. - Systemic veins: IVC measured 2.2 cm with > 50% respirophasic   variation, suggesting RA pressure 8 mmHg.   Impressions: - Normal LV size with mild LV hypertrophy. EF 60-65%. Calcified,   trileaflet aortic valve. Sclerosis without significant stenosis,   mild to moderate aortic insufficiency. Normal RV size and   systolic function. Biatrial enlargement.  Echo 11/15/19:  1. Left ventricular ejection fraction, by estimation, is 60 to 65%. The  left ventricle has normal function. The left ventricle  has no regional  wall motion abnormalities. There is mild left ventricular hypertrophy.  Left ventricular diastolic parameters  are consistent with Grade I diastolic dysfunction (impaired relaxation).   2. Right ventricular systolic function is normal. The right ventricular  size is normal. There is normal pulmonary artery systolic pressure. The  estimated right ventricular systolic pressure is 90.3 mmHg.   3. The mitral valve is degenerative. No evidence of mitral valve  regurgitation. No evidence of mitral stenosis. There is turbulence across  the mitral valve but it does not appear restricted and there is no  evidence for a significant pressure gradient  across it.   4. The aortic valve is tricuspid. Aortic valve regurgitation is trivial.  Mild aortic valve sclerosis is present, with no evidence of aortic valve  stenosis.   5. The inferior vena cava is normal in size with greater than 50%  respiratory variability, suggesting right atrial pressure of 3 mmHg.   Lower extremity Doppler 03/09/19: Tibial vessel disease; one vessel run-off via peroneal artery.  Summary:  Right: Near normal examination. No hemodynamically significant arterial  stenosis/occlusion noted in the CFA, DFA, SFA, popliteal artery and TPT.   Tibial vessel disease; zero vessel run-off.   Left: Near normal examination. No hemodynamically significant arterial  stenosis/occlusion noted in the CFA, DFA, SFA, popliteal artery and TPT.   Tibial vessel disease; one vessel run-off via peroneal artery.   Past Medical History:  Diagnosis Date   Anemia    Anxiety    HX PANIC ATTACKS   Arthritis    "starting to; in my hands" (07/09/2015)   Asthma    Atrial fibrillation (HCC)    Atrial flutter, paroxysmal (HCC)    Bloated abdomen    CFS (chronic fatigue syndrome)    Chewing difficulty    Chronic asthma with acute exacerbation    "I have chronic asthma all the time; sometimes exacerbations" (07/09/2015)    Chronic lower back pain    COPD (chronic obstructive pulmonary disease) (HCC)    Cyst of right kidney    "3 of them; dx'd in ~ 01/2015"   Dyspnea    GERD (gastroesophageal reflux disease)    Heart murmur    History of blood transfusion    "related to my brain surgery I think"   History of pulmonary embolism 07/09/2015   History of radiation therapy 04/09/20-04/22/20   SBRT Left Lung, Dr. Gery Pray   HIV antibody positive ([place])    HIV disease ([place])    Hyperlipidemia    Hypertension    Leg edema    Lipodystrophy    Mild CAD 2013   Multiple thyroid nodules    Osteopenia    Palpitations    Pneumonia 07/09/2015   Shingles    Vitamin B 12 deficiency    Vitamin D deficiency    Past Surgical History:  Procedure Laterality Date   ABDOMINAL HYSTERECTOMY     "robotic laparosopic"   BRAIN SURGERY  1974   "brain tumor; benign; on top of my brain; got a plate in there"   BRAIN SURGERY     age 88- -"Tumor pushing my skullout"   BRONCHIAL BIOPSY  02/13/2020   Procedure: BRONCHIAL BIOPSIES;  Surgeon: [name], MD;  Location: [place];  Service: Pulmonary;;   BRONCHIAL BRUSHINGS  02/13/2020   Procedure: BRONCHIAL BRUSHINGS;  Surgeon: [name], MD;  Location: [place];  Service: Pulmonary;;   BRONCHIAL NEEDLE ASPIRATION BIOPSY  02/13/2020   Procedure: BRONCHIAL NEEDLE ASPIRATION BIOPSIES;  Surgeon: [name], MD;  Location: [place];  Service: Pulmonary;;   CARDIAC CATHETERIZATION     FIDUCIAL MARKER PLACEMENT  02/13/2020   Procedure: FIDUCIAL MARKER PLACEMENT;  Surgeon: [name], MD;  Location: [facility] ENDOSCOPY;  Service: Pulmonary;;   TONSILLECTOMY AND ADENOIDECTOMY     VIDEO BRONCHOSCOPY WITH ENDOBRONCHIAL NAVIGATION N/A 02/13/2020   Procedure: VIDEO BRONCHOSCOPY WITH ENDOBRONCHIAL NAVIGATION;  Surgeon: [name], MD;  Location: [facility] ENDOSCOPY;  Service: Pulmonary;  Laterality: N/A;     Current Meds  Medication Sig   spironolactone (ALDACTONE) 25  MG tablet Take 1 tablet (25 mg total) by mouth daily.     Allergies:   Tree extract, Augmentin [amoxicillin-pot clavulanate], Lisinopril, and Ciprofloxacin   Social History   Tobacco Use   Smoking status: Never   Smokeless tobacco: Never  Vaping Use   Vaping Use: Never used  Substance Use Topics   Alcohol use: Not Currently    Alcohol/week: 0.0 standard drinks    Comment: Rarely.  Drug use: No     Family Hx: The patient's family history includes Asthma in her mother; Diabetes in her sister; Heart disease in her mother; Heart failure in her mother; Heart murmur in her brother, sister, and sister; Hypertension in her mother; Obesity in her mother; Thyroid disease in her mother and sister.   ROS:   Please see the history of present illness.     All other systems reviewed and are negative.   Labs/Other Tests and Data Reviewed:    Recent Labs: 03/12/2021: ALT 15 04/14/2021: BUN 19; Creatinine, Ser 1.35; Hemoglobin 12.2; Platelets 229; Potassium 3.8; Sodium 141   Recent Lipid Panel Lab Results  Component Value Date/Time   CHOL 227 (H) 03/12/2021 10:20 AM   TRIG 55 03/12/2021 10:20 AM   HDL 68 03/12/2021 10:20 AM   CHOLHDL 3.3 03/12/2021 10:20 AM   CHOLHDL 3.6 11/10/2019 10:55 AM   LDLCALC 150 (H) 03/12/2021 10:20 AM   LDLCALC 164 (H) 11/10/2019 10:55 AM    Wt Readings from Last 3 Encounters:  06/30/21 234 lb 14.4 oz (106.5 kg)  06/13/21 235 lb (106.6 kg)  05/12/21 232 lb 12.8 oz (105.6 kg)     EKG:   07/03/2020: Sinus tachycardia.  Rate 103 bpm.  Left axis deviation. 06/30/21: Sinus tachycardia.  Rate 103 bpm.  PACs.   Objective:    VS:  BP (!) 162/106 (BP Location: Right Arm, Patient Position: Sitting, Cuff Size: Large)   Pulse (!) 103   Ht 5\' 3"  (1.6 m)   Wt 234 lb 14.4 oz (106.5 kg)   BMI 41.61 kg/m  , BMI Body mass index is 41.61 kg/m. GENERAL:  Well appearing HEENT: Pupils equal round and reactive, fundi not visualized, oral mucosa unremarkable NECK:   No jugular venous distention, waveform within normal limits, carotid upstroke brisk and symmetric, no bruits, no thyromegaly LUNGS:  Clear to auscultation bilaterally HEART:  RRR.  PMI not displaced or sustained,S1 and S2 within normal limits, no S3, no S4, no clicks, no rubs, no murmurs ABD:  Flat, positive bowel sounds normal in frequency in pitch, no bruits, no rebound, no guarding, no midline pulsatile mass, no hepatomegaly, no splenomegaly EXT:  2 plus pulses throughout, no edema, no cyanosis no clubbing SKIN:  No rashes no nodules NEURO:  Cranial nerves II through XII grossly intact, motor grossly intact throughout PSYCH:  Cognitively intact, oriented to person place and time    ASSESSMENT & PLAN:    # Acute on chronic diastolic heart failure: # Hypertension:  Shannon [name] has significant LE edema.  She stopped taking her medications.  She will start back taking her lasix every day.  Check BNP and BMP.  Resume spironolactone and clonidine.  Blood pressure is very elevated.  She discontinued her clonidine and spironolactone.  She isn't sure why.  She is also volume overloaded.  We will restart spironolactone and start back taking   # Aortic regurgitation # Aortic stenosis: Repeat echo 10/2019 was negative for aortic stenosis and ascending aortic aneurysm. Both echos were reviewed.  It appears to be very mild, on the border between aortic stenosis and sclerosis.   # Hyperlipidemia:  Given her PAD, LDL goal is <70.  We will focus on volume and BP control for now.  Discuss at follow up.  # Atrial flutter: Converted with flecainide.  She is maintaining sinus rhythm.  She stopped Xarelto due to concern that it was causing her osteoporosis.  Restart Xarelto today.    #  Ascending aorta aneurysm: 4.0 on CT 12/2016.  It was unchanged on MRA 10/2018.  4.1 cm on echo 01/2019.  Stable on multiple CT scans.     # PAD: Lipid control as above.  She prefers to manage her other issues prior to seeing her  vascular team.  # Morbid obesity:  Will refer to PREP when she is more euvolemic.     Medication Adjustments/Labs and Tests Ordered: Current medicines are reviewed at length with the patient today.  Concerns regarding medicines are outlined above.   Tests Ordered: Orders Placed This Encounter  Procedures   Basic metabolic panel   B Nat Peptide   Basic metabolic panel   EKG 11-HERD    Medication Changes: Meds ordered this encounter  Medications   furosemide (LASIX) 20 MG tablet    Sig: Take 1 tablet (20 mg total) by mouth daily.    Dispense:  90 tablet    Refill:  0   rivaroxaban (XARELTO) 20 MG TABS tablet    Sig: Take 1 tablet (20 mg total) by mouth daily.    Dispense:  90 tablet    Refill:  0   spironolactone (ALDACTONE) 25 MG tablet    Sig: Take 1 tablet (25 mg total) by mouth daily.    Dispense:  90 tablet    Refill:  0   Time spent: 45 minutes-Greater than 50% of this time was spent in counseling, explanation of diagnosis, planning of further management, and coordination of care.   Disposition:  Follow up in 3 month(s)  Signed, Skeet Latch, MD  06/30/2021 10:46 PM    [place]

## 2021-06-30 NOTE — Patient Instructions (Signed)
Medication Instructions:  RESTART THE FOLLOWING:   XARELTO 20 MG DAILY WITH SUPPER   SPIRONOLACTONE 25 MG DAILY   FUROSEMIDE  20 DAILY   *If you need a refill on your cardiac medications before your next appointment, please call your pharmacy*  Lab Work: BMET/BNP TODAY   BMET IN 1 WEEK   If you have labs (blood work) drawn today and your tests are completely normal, you will receive your results only by: [place] (if you have MyChart) OR A paper copy in the mail If you have any lab test that is abnormal or we need to change your treatment, we will call you to review the results.  Testing/Procedures: NONE   Follow-Up: At [facility], you and your health needs are our priority.  As part of our continuing mission to provide you with exceptional heart care, we have created designated Provider Care Teams.  These Care Teams include your primary Cardiologist (physician) and Advanced Practice Providers (APPs -  Physician Assistants and Nurse Practitioners) who all work together to provide you with the care you need, when you need it.  We recommend signing up for the patient portal called "MyChart".  Sign up information is provided on this After Visit Summary.  MyChart is used to connect with patients for Virtual Visits (Telemedicine).  Patients are able to view lab/test results, encounter notes, upcoming appointments, etc.  Non-urgent messages can be sent to your provider as well.   To learn more about what you can do with MyChart, go to [email or web address].    Your next appointment:   2 month(s)  The format for your next appointment:   In Person  Provider:   Skeet Latch, MD   Other Instructions  Your exercise goal is 150 minutes per week.  You can do this by exercising for 10 minutes, 15 times per week, 30 minutes for 5 times per week, or any combination that gets you to a total of 150 minutes.  Make sure you don't drink more than 1.5L to 2L of fluids daily. If  you get thirsty drink ice chips or sugar free lemon candy.  Make sure that you take ALL of year heart medications every day unless you call and talk to [place].

## 2021-07-01 LAB — BRAIN NATRIURETIC PEPTIDE: BNP: 58.4 pg/mL (ref 0.0–100.0)

## 2021-07-01 LAB — BASIC METABOLIC PANEL
BUN/Creatinine Ratio: 16 (ref 12–28)
BUN: 21 mg/dL (ref 8–27)
CO2: 32 mmol/L — ABNORMAL HIGH (ref 20–29)
Calcium: 10.1 mg/dL (ref 8.7–10.3)
Chloride: 103 mmol/L (ref 96–106)
Creatinine, Ser: 1.28 mg/dL — ABNORMAL HIGH (ref 0.57–1.00)
Glucose: 95 mg/dL (ref 70–99)
Potassium: 4.4 mmol/L (ref 3.5–5.2)
Sodium: 144 mmol/L (ref 134–144)
eGFR: 46 mL/min/{1.73_m2} — ABNORMAL LOW (ref >59)
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
## 2021-07-02 ENCOUNTER — Other Ambulatory Visit: Payer: Self-pay

## 2021-07-02 ENCOUNTER — Ambulatory Visit (INDEPENDENT_AMBULATORY_CARE_PROVIDER_SITE_OTHER): Payer: Medicare Other | Admitting: Internal Medicine

## 2021-07-02 ENCOUNTER — Telehealth: Payer: Self-pay

## 2021-07-02 DIAGNOSIS — R0602 Shortness of breath: Secondary | ICD-10-CM | POA: Diagnosis not present

## 2021-07-02 DIAGNOSIS — B20 Human immunodeficiency virus [HIV] disease: Secondary | ICD-10-CM

## 2021-07-02 NOTE — Progress Notes (Signed)
Virtual Visit via Telephone Note  I connected with Shannon [name] on 07/02/21 at 10:15 AM EST by telephone and verified that I am speaking with the correct person using two identifiers.  Location: Patient: at home Provider: in clinic   I discussed the limitations, risks, security and privacy concerns of performing an evaluation and management service by telephone and the availability of in person appointments. I also discussed with the patient that there may be a patient responsible charge related to this service. The patient expressed understanding and agreed to proceed.   History of Present Illness: 65 yo F with well-controlled HIV disease,  stage IA (T1c, N0, M0) non-small cell lung cancer starting XRT- now in remission, AS, poorly controlled HIV disease, DM, obesity. Getting back onto xarelto, and diuretics to help with HTN and fluid status this past year by Dr Oval Linsey, her cardiologist. She was also found to have spontaneous rib fracture (cough with osteoporosis).    Observations/Objective: Still some rib pain with coughing/wears abdominal binder  Assessment and Plan:   Follow Up Instructions: Will have her come in for labs and appt   I discussed the assessment and treatment plan with the patient. The patient was provided an opportunity to ask questions and all were answered. The patient agreed with the plan and demonstrated an understanding of the instructions.   The patient was advised to call back or seek an in-person evaluation if the symptoms worsen or if the condition fails to improve as anticipated.  I provided 10 minutes of non-face-to-face time during this encounter.   [name], MD

## 2021-07-02 NOTE — Telephone Encounter (Signed)
Patient called office today requesting to switch appt from in person to phone. States she is not feeling well, but does not want to reschedule visit for a later date. Appt converted to phone visit. Advised patient that she will need to schedule appt for overdue labs.  Shannon [name], RMA

## 2021-07-03 ENCOUNTER — Ambulatory Visit: Payer: Medicare Other | Admitting: Internal Medicine

## 2021-07-03 ENCOUNTER — Other Ambulatory Visit: Payer: Self-pay

## 2021-07-03 ENCOUNTER — Ambulatory Visit
Admission: RE | Admit: 2021-07-03 | Discharge: 2021-07-03 | Disposition: A | Discharge status: Home / Self Care | Payer: Medicare Other | Source: Ambulatory Visit | Attending: Radiation Oncology | Admitting: Radiation Oncology

## 2021-07-03 ENCOUNTER — Encounter: Payer: Self-pay | Admitting: Radiation Oncology

## 2021-07-03 VITALS — BP 157/119 | HR 94 | Temp 97.5°F | Resp 20 | Ht 63.0 in | Wt 230.0 lb

## 2021-07-03 DIAGNOSIS — C3492 Malignant neoplasm of unspecified part of left bronchus or lung: Secondary | ICD-10-CM

## 2021-07-03 DIAGNOSIS — Z923 Personal history of irradiation: Secondary | ICD-10-CM | POA: Diagnosis not present

## 2021-07-03 DIAGNOSIS — Z7901 Long term (current) use of anticoagulants: Secondary | ICD-10-CM | POA: Diagnosis not present

## 2021-07-03 DIAGNOSIS — Z08 Encounter for follow-up examination after completed treatment for malignant neoplasm: Secondary | ICD-10-CM | POA: Diagnosis not present

## 2021-07-03 DIAGNOSIS — R609 Edema, unspecified: Secondary | ICD-10-CM | POA: Insufficient documentation

## 2021-07-03 DIAGNOSIS — Z79899 Other long term (current) drug therapy: Secondary | ICD-10-CM | POA: Insufficient documentation

## 2021-07-03 DIAGNOSIS — Z791 Long term (current) use of non-steroidal anti-inflammatories (NSAID): Secondary | ICD-10-CM | POA: Insufficient documentation

## 2021-07-03 DIAGNOSIS — C3412 Malignant neoplasm of upper lobe, left bronchus or lung: Secondary | ICD-10-CM | POA: Diagnosis not present

## 2021-07-03 MED ORDER — AZITHROMYCIN 250 MG PO TABS: 250.0000 mg | ORAL_TABLET | Freq: Every day | ORAL | 0 refills | Status: DC

## 2021-07-03 MED ORDER — AZITHROMYCIN 250 MG PO TABS: 250.0000 mg | ORAL_TABLET | Freq: Every day | ORAL | Status: DC

## 2021-07-03 MED ORDER — AZITHROMYCIN 250 MG PO TABS: 500.0000 mg | ORAL_TABLET | Freq: Every day | ORAL | Status: DC

## 2021-07-03 NOTE — Progress Notes (Signed)
Shannon [name] is here today for follow up post radiation to the lung.  Lung Side: left  Completed radiation treatment on: 04/22/2020  Does the patient complain of any of the following: Pain:4/10 low back pain, headaches, chest pain with breathing Shortness of breath w/wo exertion: at rest Cough: productive cough with light green phlegm Hemoptysis: denies Pain with swallowing: denies Swallowing/choking concerns: denies Appetite: good Energy Level: poor Post radiation skin Changes: n/a    Additional comments if applicable: headaches, broken rib seen on xray, low back pain, shortness of breath at rest, throat dryness, hoarseness, chest congestion, insomnia  Vitals:   07/03/21 1022  BP: (!) 157/119  Pulse: 94  Resp: 20  Temp: (!) 97.5 F (36.4 C)  SpO2: 99%  Weight: 230 lb (104.3 kg)  Height: 5\' 3"  (1.6 m)

## 2021-07-06 DIAGNOSIS — J449 Chronic obstructive pulmonary disease, unspecified: Secondary | ICD-10-CM | POA: Diagnosis not present

## 2021-07-07 ENCOUNTER — Ambulatory Visit: Payer: Medicare Other | Admitting: Radiation Oncology

## 2021-07-10 ENCOUNTER — Other Ambulatory Visit: Payer: Self-pay | Admitting: Internal Medicine

## 2021-07-21 DIAGNOSIS — J45909 Unspecified asthma, uncomplicated: Secondary | ICD-10-CM | POA: Diagnosis not present

## 2021-07-21 DIAGNOSIS — R911 Solitary pulmonary nodule: Secondary | ICD-10-CM | POA: Diagnosis not present

## 2021-07-21 DIAGNOSIS — J449 Chronic obstructive pulmonary disease, unspecified: Secondary | ICD-10-CM | POA: Diagnosis not present

## 2021-07-21 DIAGNOSIS — R269 Unspecified abnormalities of gait and mobility: Secondary | ICD-10-CM | POA: Diagnosis not present

## 2021-07-22 ENCOUNTER — Ambulatory Visit: Payer: Medicare Other | Admitting: Sports Medicine

## 2021-07-23 ENCOUNTER — Other Ambulatory Visit: Payer: Self-pay | Admitting: Internal Medicine

## 2021-07-25 ENCOUNTER — Encounter: Payer: Medicare Other | Admitting: Internal Medicine

## 2021-07-29 ENCOUNTER — Ambulatory Visit: Payer: Medicare Other | Admitting: Gastroenterology

## 2021-08-01 ENCOUNTER — Encounter: Payer: Medicare Other | Admitting: Internal Medicine

## 2021-08-05 ENCOUNTER — Other Ambulatory Visit: Payer: Self-pay | Admitting: Internal Medicine

## 2021-08-05 DIAGNOSIS — E538 Deficiency of other specified B group vitamins: Secondary | ICD-10-CM

## 2021-08-11 ENCOUNTER — Telehealth: Payer: Self-pay | Admitting: *Deleted

## 2021-08-11 NOTE — Telephone Encounter (Signed)
CALLED PATIENT TO INFORM OF CT FOR 09-29-21- ARRIVAL TIME- 11:15 AM @ [facility], NO RESTRICTIONS TO TEST, PATIENT TO RECEIVE RESULTS FROM DR. KINARD ON 10-02-21 @ 11:30 AM FOR RESULTS, SPOKE WITH PATIENT AND SHE IS AWARE OF THESE APPTS

## 2021-08-12 ENCOUNTER — Encounter: Payer: Medicare Other | Admitting: Internal Medicine

## 2021-08-12 ENCOUNTER — Ambulatory Visit (INDEPENDENT_AMBULATORY_CARE_PROVIDER_SITE_OTHER): Payer: Medicare Other | Admitting: Internal Medicine

## 2021-08-12 DIAGNOSIS — M546 Pain in thoracic spine: Secondary | ICD-10-CM

## 2021-08-12 NOTE — Assessment & Plan Note (Addendum)
Patient complains of right, mid thoracic back pain over the last month or so.  She says the pain is affecting her ability to walk, and is worried that it "might be her kidney."  Though it is at times painful, 10/10 in severity, it is more often a pressure sensation.  She is making urine and has actually had newer onset urgency incontinence over the last several months.  She denies any hematuria and denies history of kidney stones though she does describe a history of renal cyst.  The pain is described as constant in nature though Aleve and Tylenol do help alleviate the pain slightly.  She denies dysuria.  She denies any recent trauma or heavy lifting.  She denies fecal incontinence or saddle anesthesia and has not had any new weakness from baseline.  She denies radiation of the pain beyond her back.  She has not had any fevers, chills, weight loss, night sweats.  In July 2022 she did have MRI abdomen pelvis which showed abnormal edema within and around the medial aspect of the right 10th rib suspicious for nondisplaced rib fracture as well as numerous bilateral renal lesions of varying complexity that appeared benign and more likely cysts.  Further, she underwent CT chest in September 2022 which showed a nodule in her right lower lobe.  She is currently on antiviral therapy for HIV.  Assessment: Patient describes recent onset of right sided thoracic back pain.  She was unable to come into clinic for an in person appointment today and elected to phone in for this appointment instead.  However given her multiple risk factors including lung cancer, HIV, multiple vitamin deficiencies, and likely renal cysts, a telephone encounter is not sufficient for complete evaluation as her pain could be attributed to multiple etiologies including muscule strain, pathological fracture, related to incidental finding of renal cyst previously, related to right lower lobe nodule. Plan: Counseled patient to continue with Aleve and  Tylenol for pain relief as well as using heat.  I have contacted our front desk to schedule her for an urgent appointment for further evaluation given her multiple comorbidities.

## 2021-08-12 NOTE — Progress Notes (Signed)
°  [facility] Res[facility] Telephone Encounter Continuity Care Appointment  HPI:  This telephone encounter was created for Ms. Ocie Bob Dimauro on 08/12/2021 for the following purpose/cc acute right-sided mid back pain.  Please see problem is charting for detail assessment and plan.   Past Medical History:  Past Medical History:  Diagnosis Date   Anemia    Anxiety    HX PANIC ATTACKS   Arthritis    "starting to; in my hands" (07/09/2015)   Asthma    Atrial fibrillation (HCC)    Atrial flutter, paroxysmal (HCC)    Bloated abdomen    CFS (chronic fatigue syndrome)    Chewing difficulty    Chronic asthma with acute exacerbation    "I have chronic asthma all the time; sometimes exacerbations" (07/09/2015)   Chronic lower back pain    COPD (chronic obstructive pulmonary disease) ([place])    Cyst of right kidney    "3 of them; dx'd in ~ 01/2015"   Dyspnea    GERD (gastroesophageal reflux disease)    Heart murmur    History of blood transfusion    "related to my brain surgery I think"   History of pulmonary embolism 07/09/2015   History of radiation therapy 04/09/20-04/22/20   SBRT Left Lung, Dr. Gery Pray   HIV antibody positive ([place])    HIV disease ([place])    Hyperlipidemia    Hypertension    Leg edema    Lipodystrophy    Mild CAD 2013   Multiple thyroid nodules    Osteopenia    Palpitations    Pneumonia 07/09/2015   Shingles    Vitamin B 12 deficiency    Vitamin D deficiency      ROS:  Review of Systems  Constitutional:  Negative for chills, diaphoresis, fever, malaise/fatigue and weight loss.  Respiratory:  Negative for shortness of breath.   Cardiovascular:  Negative for chest pain.  Gastrointestinal:  Negative for abdominal pain, constipation and diarrhea.  Genitourinary:  Positive for urgency (With incontinence). Negative for dysuria, flank pain and hematuria.  Musculoskeletal:  Positive for back pain and myalgias.  Neurological:  Negative for  weakness.    Assessment / Plan / Recommendations:  Please see A&P under problem oriented charting for assessment of the patient's acute and chronic medical conditions.  As always, pt is advised that if symptoms worsen or new symptoms arise, they should go to an urgent care facility or to to ER for further evaluation.   Consent and Medical Decision Making:  Patient discussed with Dr.  Saverio Danker This is a telephone encounter between [name] and Farrel Gordon on 08/12/2021 for acute right-sided mid back pain. The visit was conducted with the patient located at home and Farrel Gordon at [facility]. The patient's identity was confirmed using their DOB and current address. The patient has consented to being evaluated through a telephone encounter and understands the associated risks (an examination cannot be done and the patient may need to come in for an appointment) / benefits (allows the patient to remain at home, decreasing exposure to coronavirus). I personally spent 30 minutes on medical discussion.

## 2021-08-13 ENCOUNTER — Other Ambulatory Visit (INDEPENDENT_AMBULATORY_CARE_PROVIDER_SITE_OTHER): Payer: Medicare Other | Admitting: Internal Medicine

## 2021-08-13 DIAGNOSIS — J449 Chronic obstructive pulmonary disease, unspecified: Secondary | ICD-10-CM

## 2021-08-13 DIAGNOSIS — I5032 Chronic diastolic (congestive) heart failure: Secondary | ICD-10-CM

## 2021-08-13 NOTE — Progress Notes (Signed)
Internal Medicine Clinic Attending ? ?Case discussed with Dr. Dean  At the time of the visit.  We reviewed the resident?s history and exam and pertinent patient test results.  I agree with the assessment, diagnosis, and plan of care documented in the resident?s note.  ?

## 2021-08-19 ENCOUNTER — Encounter: Payer: Self-pay | Admitting: Internal Medicine

## 2021-08-19 ENCOUNTER — Ambulatory Visit (HOSPITAL_COMMUNITY)
Admission: RE | Admit: 2021-08-19 | Discharge: 2021-08-19 | Disposition: A | Discharge status: Home / Self Care | Payer: Medicare Other | Source: Ambulatory Visit | Attending: Internal Medicine | Admitting: Internal Medicine

## 2021-08-19 ENCOUNTER — Other Ambulatory Visit: Payer: Self-pay

## 2021-08-19 ENCOUNTER — Ambulatory Visit (INDEPENDENT_AMBULATORY_CARE_PROVIDER_SITE_OTHER): Payer: Medicare Other | Admitting: Internal Medicine

## 2021-08-19 VITALS — BP 156/93 | HR 110 | Temp 98.4°F | Wt 233.4 lb

## 2021-08-19 DIAGNOSIS — M545 Low back pain, unspecified: Secondary | ICD-10-CM

## 2021-08-19 DIAGNOSIS — J4551 Severe persistent asthma with (acute) exacerbation: Secondary | ICD-10-CM

## 2021-08-19 DIAGNOSIS — R0789 Other chest pain: Secondary | ICD-10-CM | POA: Insufficient documentation

## 2021-08-19 DIAGNOSIS — G8929 Other chronic pain: Secondary | ICD-10-CM | POA: Insufficient documentation

## 2021-08-19 MED ORDER — FLUTICASONE FUROATE-VILANTEROL 100-25 MCG/ACT IN AEPB: 1.0000 | INHALATION_SPRAY | Freq: Every day | RESPIRATORY_TRACT | 2 refills | Status: DC

## 2021-08-19 NOTE — Progress Notes (Signed)
° °  CC: right sided back pain and shakiness in hands  HPI:Shannon [name] is a 65 y.o. female who presents for evaluation right sided back pain and shakiness in her hands. She is also tachycardia and endorses using albuterol frequently for Asthma. Please see individual problem based A/P for details.   Past Medical History:  Diagnosis Date   Anemia    Anxiety    HX PANIC ATTACKS   Arthritis    "starting to; in my hands" (07/09/2015)   Asthma    Atrial fibrillation (HCC)    Atrial flutter, paroxysmal (HCC)    Bloated abdomen    CFS (chronic fatigue syndrome)    Chewing difficulty    Chronic asthma with acute exacerbation    "I have chronic asthma all the time; sometimes exacerbations" (07/09/2015)   Chronic lower back pain    COPD (chronic obstructive pulmonary disease) ([place])    Cyst of right kidney    "3 of them; dx'd in ~ 01/2015"   Dyspnea    GERD (gastroesophageal reflux disease)    Heart murmur    History of blood transfusion    "related to my brain surgery I think"   History of pulmonary embolism 07/09/2015   History of radiation therapy 04/09/20-04/22/20   SBRT Left Lung, Dr. Gery Pray   HIV antibody positive ([place])    HIV disease ([place])    Hyperlipidemia    Hypertension    Leg edema    Lipodystrophy    Mild CAD 2013   Multiple thyroid nodules    Osteopenia    Palpitations    Pneumonia 07/09/2015   Shingles    Vitamin B 12 deficiency    Vitamin D deficiency    Review of Systems:   Review of Systems  Constitutional:  Negative for chills and fever.  Respiratory:  Positive for shortness of breath. Negative for cough and sputum production.   Musculoskeletal:  Positive for back pain. Negative for falls.    Physical Exam: Vitals:   08/19/21 1434  BP: (!) 156/93  Pulse: (!) 110  Temp: 98.4 F (36.9 C)  TempSrc: Oral  SpO2: 94%  Weight: 233 lb 6.4 oz (105.9 kg)   General: pressured speech, Central Obesity  HEENT: Dry oral mucosa, no JVD Cardiovascular:  Tachycardic, regular rhythm.  No murmurs, rubs, or gallops Pulmonary : Equal breath sounds, No wheezes Back: No deformity on inspection, patient will not allow for palpation. Reports too much pain last time doctor checked for CVA tenderness.  Ext: No lower extremity edema  Assessment & Plan:   See Encounters Tab for problem based charting.  Patient discussed with Dr. Jimmye Norman

## 2021-08-19 NOTE — Assessment & Plan Note (Addendum)
Patient presents with mild tachycardia and shakiness in her hands. Her speech is pressured and she reports anxious feeling. She has been using albuterol every 4-6 hours. She has symptoms of SOB continuously with frequent night time symptoms. She has been tried on long acting inhalers by her pulmonologist previously and notes report no benefit. Discussed with patient albuterol is likely causing side effect from continuous use and we must start long acting inhaler. Starting [name] today. She has follow up with Pulmonology Jan 16th.

## 2021-08-19 NOTE — Assessment & Plan Note (Signed)
Patient has history of right sided rib fracture. Reports severe pain and will no allow for full exam. Will send for xray of ribs. Checking urine as patient has a strong belief this could be coming from kidneys. However no other urinary symptoms. She appears dehydrated today, will hold lasix for now and monitor. Has follow up on 1/9 with cardiology.  She did start taking Levaquin prescribed for acute respiratory illness previously. Will further evaluate acute problem and have patient follow up for discussion of starting weight loss medications. Discussed this briefly with patient.

## 2021-08-19 NOTE — Progress Notes (Signed)
Internal Medicine Clinic Attending  Case discussed with Dr. Steen  At the time of the visit.  We reviewed the resident's history and exam and pertinent patient test results.  I agree with the assessment, diagnosis, and plan of care documented in the resident's note.  

## 2021-08-19 NOTE — Patient Instructions (Signed)
Thank you for trusting me with your care. To recap, today we discussed the following:   Right side back pain - Urinalysis, Complete (81001) - DG Ribs Unilateral Right  2. Severe persistent asthma  - fluticasone furoate-vilanterol (BREO ELLIPTA) 100-25 MCG/ACT AEPB; Inhale 1 puff into the lungs daily.  Dispense: 28 each; Refill: 2 - Continue albuterol   Hold Lasix. You appear dehydrated today. If you gain 3 pounds in a day or 5 pounds in a week restart.

## 2021-08-20 LAB — MICROSCOPIC EXAMINATION: Bacteria, UA: NONE SEEN

## 2021-08-20 LAB — URINALYSIS, COMPLETE
Bilirubin, UA: NEGATIVE
Glucose, UA: NEGATIVE
Ketones, UA: NEGATIVE
Leukocytes,UA: NEGATIVE
Nitrite, UA: NEGATIVE
RBC, UA: NEGATIVE
Specific Gravity, UA: 1.029 (ref 1.005–1.030)
Urobilinogen, Ur: 0.2 mg/dL (ref 0.2–1.0)
pH, UA: 5 (ref 5.0–7.5)

## 2021-08-21 ENCOUNTER — Telehealth (HOSPITAL_BASED_OUTPATIENT_CLINIC_OR_DEPARTMENT_OTHER): Payer: Self-pay | Admitting: Cardiovascular Disease

## 2021-08-21 IMAGING — CT CT CHEST HIGH RESOLUTION W/O CM
2 of 7 series · 14 of 36 positions shown, 17 images · non-contrast
Comparison: CT chest, 08/21/2020, thyroid ultrasound and biopsy,
02/06/2020, 03/19/2020

CLINICAL DATA: Shortness of breath, left-sided chest pain, status
post chest radiation, history of lung cancer

EXAM:
CT CHEST WITHOUT CONTRAST
TECHNIQUE: Multidetector CT imaging of the chest was performed following the
standard protocol without intravenous contrast. High resolution
imaging of the lungs, as well as inspiratory and expiratory imaging,
was performed.

[Series 4: high resolution retro · axial · 0.81mm/px · z∈[+113,+337]mm · 11 of 270 slices shown, 14 images]
[im 23/270  mediastinal]
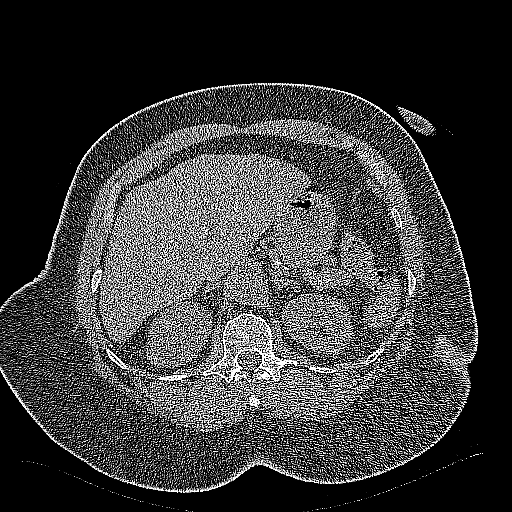
[im 23/270  lung]
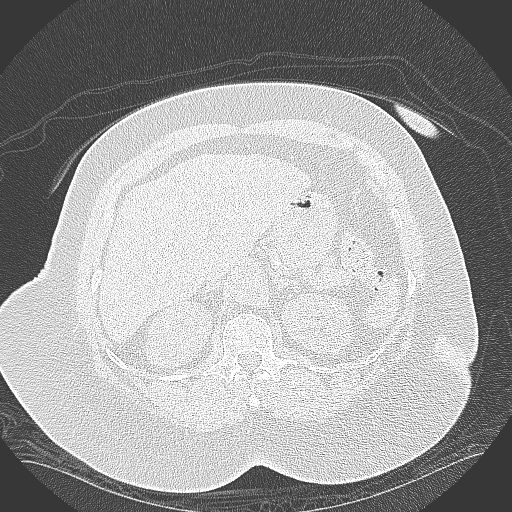
[im 45/270  lung]
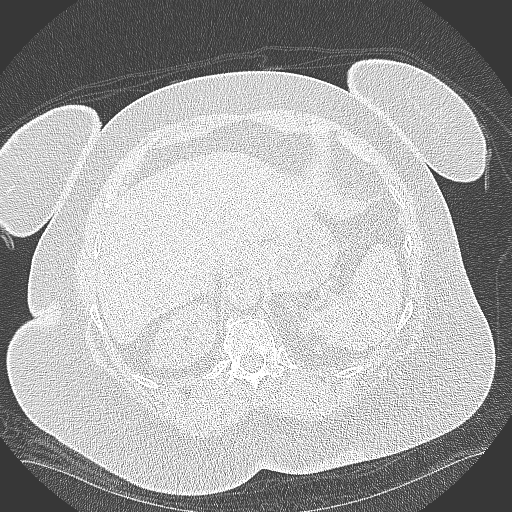
[im 68/270  lung]
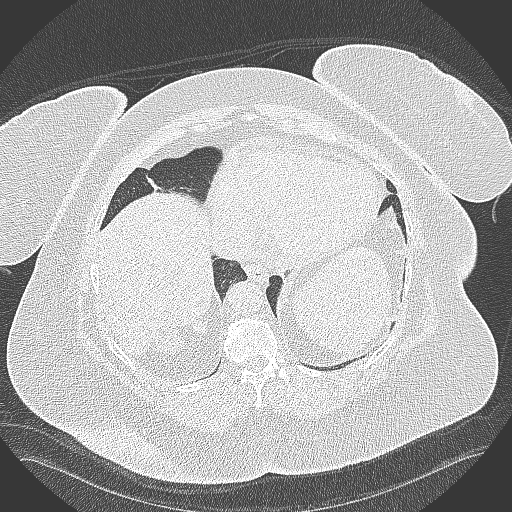
[im 90/270  lung]
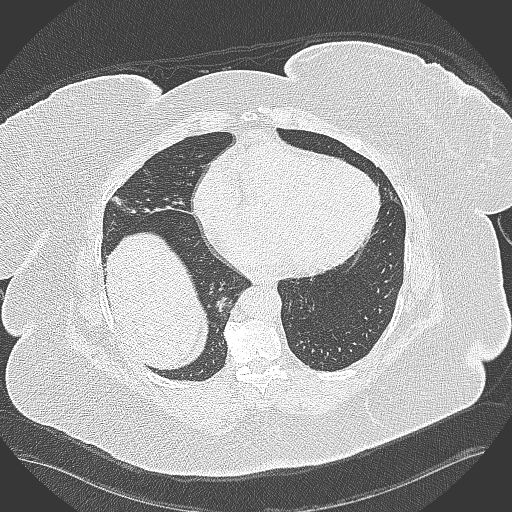
[im 113/270  mediastinal]
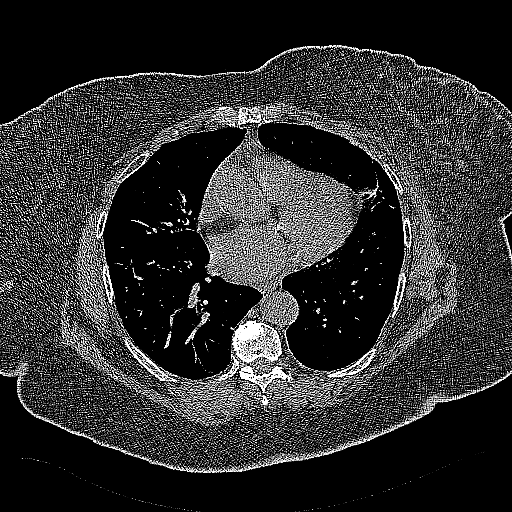
[im 113/270  lung]
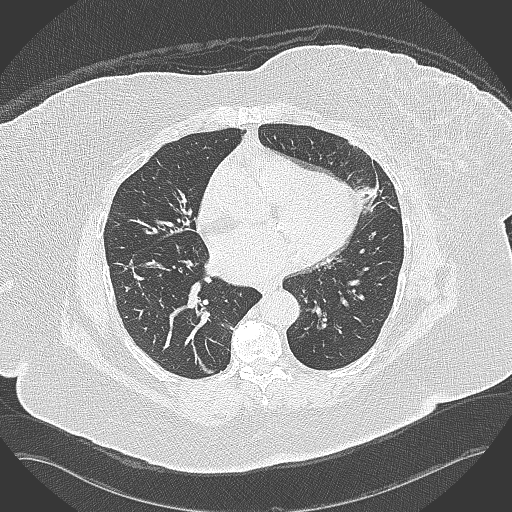
[im 135/270  lung]
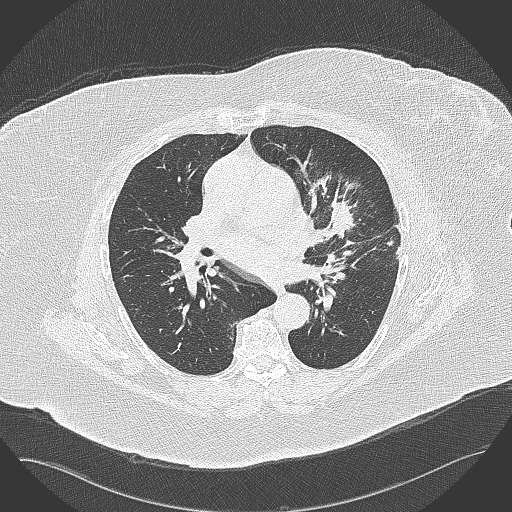
[im 157/270  lung]
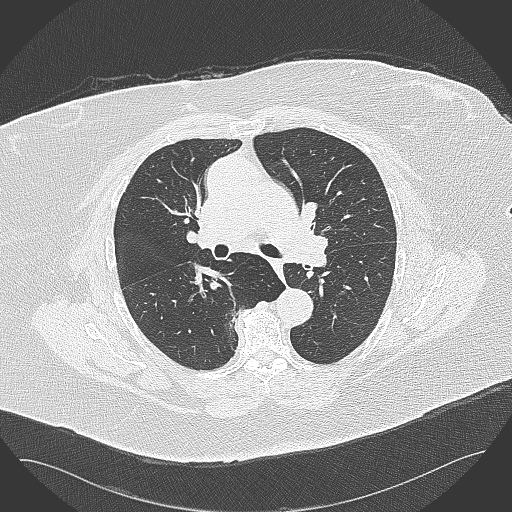
[im 180/270  lung]
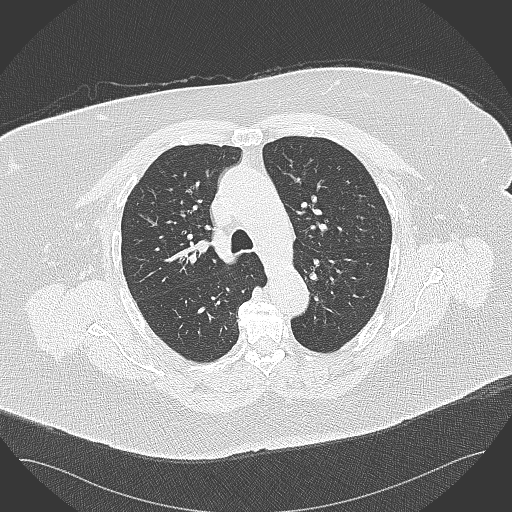
[im 202/270  mediastinal]
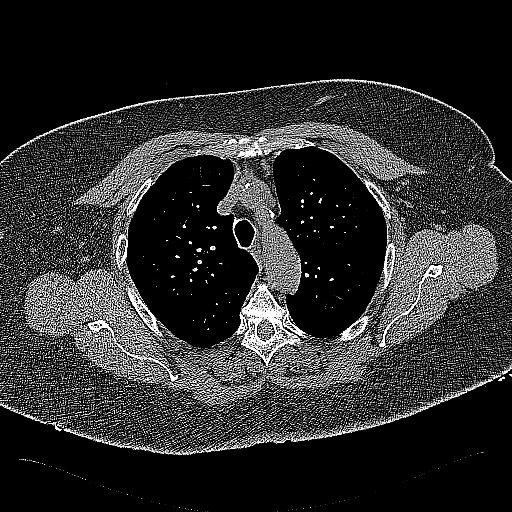
[im 202/270  lung]
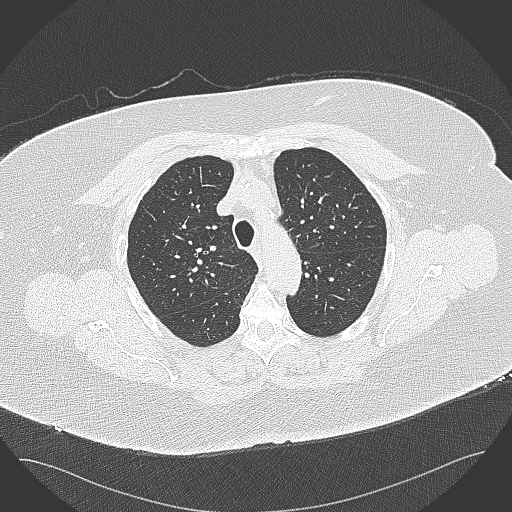
[im 225/270  lung]
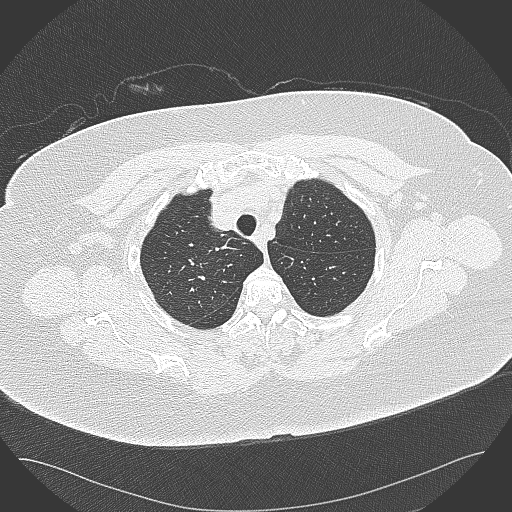
[im 247/270  lung]
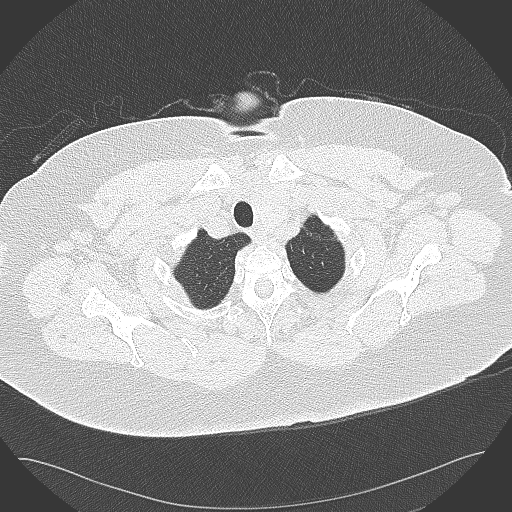

[Series 7: coronal · coronal · 0.67mm/px · 3 of 97 slices shown]
[im 20/97  lung]
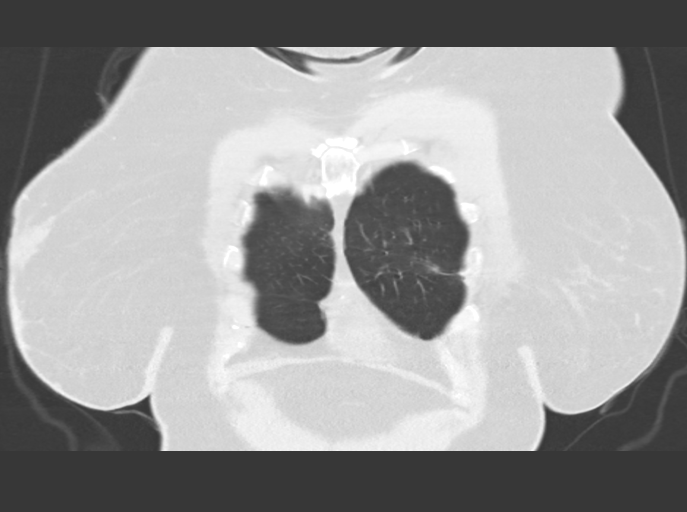
[im 39/97  lung]
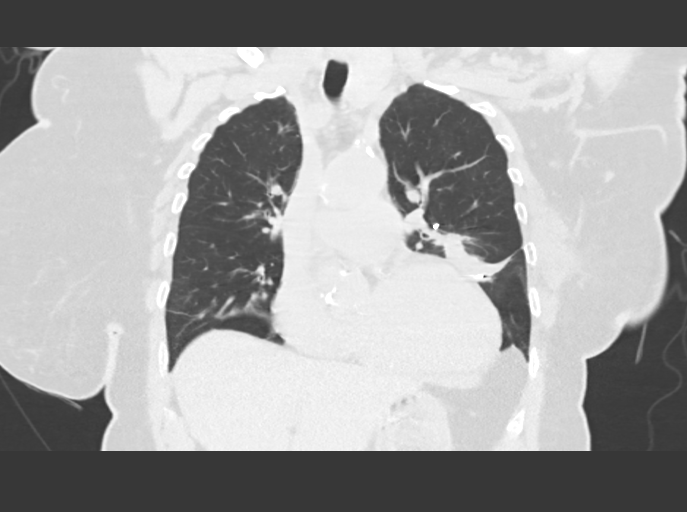
[im 58/97  lung]
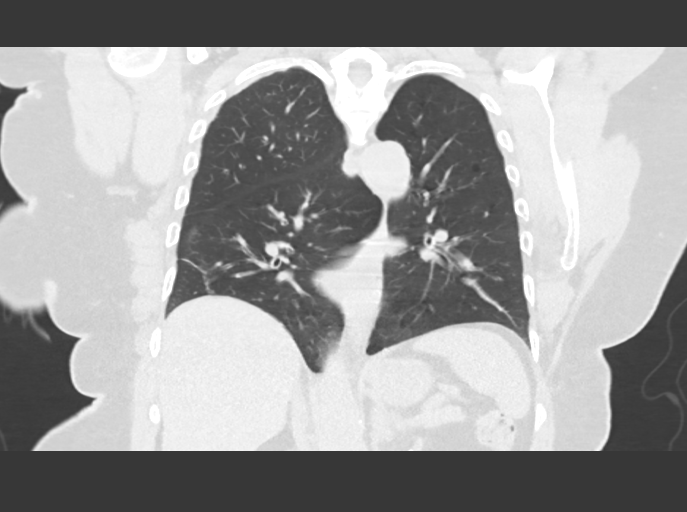

[14 of 36 positions shown; findings below may reference images not displayed]

FINDINGS: Cardiovascular: Aortic atherosclerosis. Normal heart size.
Three-vessel coronary artery calcifications. No pericardial
effusion.

Mediastinum/Nodes: No enlarged mediastinal, hilar, or axillary lymph
nodes. Redemonstrated asymmetric, nodular enlargement of the left
lobe of the thyroid, previously assessed by dedicated thyroid
ultrasound and biopsy. This has been evaluated on previous imaging.
(ref: [HOSPITAL]. [DATE]): 143-50). Trachea, and
esophagus demonstrate no significant findings.

Lungs/Pleura: Diffuse bilateral bronchial wall thickening. There is
mild, predominantly bandlike scarring of the lower lungs. No
significant air trapping on expiratory phase imaging. Interval
decrease in size of a spiculated mass of the infrahilar left upper
lobe and lingula, dominant component measuring approximately 2.9 x
2.6 cm, previously approximately 5.3 x 4.5 cm, although on prior
examination likely including postobstructive airspace disease
(series 4, image 142). No pleural effusion or pneumothorax.

Upper Abdomen: No acute abnormality.

Musculoskeletal: No chest wall mass or suspicious bone lesions
identified. There is asymmetric soft tissue stranding overlying the
intrinsic musculature of the left chest wall (series 2, image 71).
IMPRESSION: 1. Interval decrease in size of a spiculated mass of the infrahilar
left upper lobe and lingula, dominant component measuring
approximately 2.9 x 2.6 cm, previously approximately 5.3 x 4.5 cm,
although on prior examination likely including postobstructive
airspace disease. Findings are consistent with treatment response.
2. Diffuse bilateral bronchial wall thickening, consistent with
nonspecific infectious or inflammatory bronchitis.
3. Bland appearing scarring of the bilateral lung bases. No evidence
of fibrotic interstitial lung disease or other specific findings to
explain shortness of breath.
4. There is asymmetric soft tissue stranding overlying the intrinsic
musculature of the left chest wall, similar in appearance to prior
examination dated 08/21/2020, suggestive of contusion or other
nonspecific inflammation. There is no underlying fracture or
discrete mass noted.
5. Coronary artery disease.

Aortic Atherosclerosis (NCOJ6-RVE.E).

## 2021-08-21 NOTE — Telephone Encounter (Signed)
Hey I know Dr. Oval Linsey had mentioned something about seeing a couple extra patients on some quarter days but I am unsure which days those are, can you take a look and see if we can get this patient rescheduled please?

## 2021-08-21 NOTE — Telephone Encounter (Signed)
Patient has conflict in appt for 6/56 advise patient dr Oval Linsey next availability is in April. Patient ask that the nurse gives her a call because she cant wait that long

## 2021-08-26 ENCOUNTER — Telehealth: Payer: Self-pay | Admitting: Internal Medicine

## 2021-08-26 ENCOUNTER — Telehealth: Payer: Self-pay

## 2021-08-26 NOTE — Telephone Encounter (Signed)
Spoke with patient and reschedule her to see Overton Mam NP  Scheduled follow up in April with Dr Oval Linsey

## 2021-08-26 NOTE — Telephone Encounter (Signed)
Attempted call x2 to discuss recent xray to further evaluate rib pain. Unable to reach patient. If pain is not improving plan to send for CT.

## 2021-08-26 NOTE — Telephone Encounter (Signed)
PA  for pt ( BREA ELLIPTA 100-25MCG ORAL INH ) came through on cover my meds was submitted       DECISION :    Outcome  Additional Information Required  This medication or product is on your plan's list of covered drugs.  Prior authorization is not required at this time. If your pharmacy has questions regarding the processing of your prescription, please have them call the [facility] pharmacy help desk at [phone]. **Please note: This request was submitted electronically. Formulary lowering, tiering exception, cost reduction and/or pre-benefit determination review (including prospective Medicare hospice reviews) requests cannot be requested using this method of submission. Providers contact us at [phone] for further assistance. Drug Breo Ellipta 100-25MCG/ACT aerosol powder Form [employer] Medicare Part D Electronic Prior Authorization Form (2017 NCPDP)    ( COPY SENT TO PHARMACY ALSO )

## 2021-08-27 ENCOUNTER — Other Ambulatory Visit: Payer: Self-pay

## 2021-08-27 ENCOUNTER — Other Ambulatory Visit (HOSPITAL_COMMUNITY)
Admission: RE | Admit: 2021-08-27 | Discharge: 2021-08-27 | Disposition: A | Discharge status: Home / Self Care | Payer: Medicare Other | Source: Ambulatory Visit | Attending: Internal Medicine | Admitting: Internal Medicine

## 2021-08-27 ENCOUNTER — Other Ambulatory Visit: Payer: Medicaid Other

## 2021-08-27 DIAGNOSIS — B20 Human immunodeficiency virus [HIV] disease: Secondary | ICD-10-CM | POA: Diagnosis present

## 2021-08-27 DIAGNOSIS — Z113 Encounter for screening for infections with a predominantly sexual mode of transmission: Secondary | ICD-10-CM

## 2021-08-28 ENCOUNTER — Ambulatory Visit (INDEPENDENT_AMBULATORY_CARE_PROVIDER_SITE_OTHER): Payer: Commercial Managed Care - HMO | Admitting: Internal Medicine

## 2021-08-28 ENCOUNTER — Other Ambulatory Visit: Payer: Self-pay

## 2021-08-28 ENCOUNTER — Encounter: Payer: Self-pay | Admitting: Internal Medicine

## 2021-08-28 DIAGNOSIS — R0789 Other chest pain: Secondary | ICD-10-CM

## 2021-08-28 DIAGNOSIS — M546 Pain in thoracic spine: Secondary | ICD-10-CM | POA: Diagnosis not present

## 2021-08-28 DIAGNOSIS — I5032 Chronic diastolic (congestive) heart failure: Secondary | ICD-10-CM

## 2021-08-28 LAB — URINE CYTOLOGY ANCILLARY ONLY
Chlamydia: NEGATIVE
Comment: NEGATIVE
Comment: NORMAL
Neisseria Gonorrhea: NEGATIVE

## 2021-08-28 LAB — T-HELPER CELL (CD4) - (RCID CLINIC ONLY)
CD4 % Helper T Cell: 43 % (ref 33–65)
CD4 T Cell Abs: 474 /uL (ref 400–1790)

## 2021-08-28 MED ORDER — SPIRONOLACTONE 25 MG PO TABS: 25.0000 mg | ORAL_TABLET | Freq: Every day | ORAL | 0 refills | Status: DC

## 2021-08-28 MED ORDER — CYCLOBENZAPRINE HCL 5 MG PO TABS: 2.5000 mg | ORAL_TABLET | ORAL | 0 refills | Status: DC | PRN

## 2021-08-28 NOTE — Patient Instructions (Addendum)
I have sent in a short course of flexeril for your pain. Take 1/2-1 tablet as needed at night. Take tylenol as you have been. Your CT is scheduled for 09/29/21. We will be able to further evaluate your ribs and surrounding soft tissue to look for causes of the pain via this.

## 2021-08-28 NOTE — Progress Notes (Incomplete)
° °  CC: f/u from last OV  HPI:Shannon [name] is a 66 y.o. female who presents for evaluation of ***. Please see individual problem based A/P for details.  Right sided back pain  Shakiness in hands Tachycardia from frequent albuterol use   Depression, PHQ-9: Based on the patients  [place] Visit from 08/19/2021 in [place]  PHQ-9 Total Score 14      score we have ***.  Past Medical History:  Diagnosis Date   Anemia    Anxiety    HX PANIC ATTACKS   Arthritis    "starting to; in my hands" (07/09/2015)   Asthma    Atrial fibrillation (HCC)    Atrial flutter, paroxysmal (HCC)    Bloated abdomen    CFS (chronic fatigue syndrome)    Chewing difficulty    Chronic asthma with acute exacerbation    "I have chronic asthma all the time; sometimes exacerbations" (07/09/2015)   Chronic lower back pain    COPD (chronic obstructive pulmonary disease) ([place])    Cyst of right kidney    "3 of them; dx'd in ~ 01/2015"   Dyspnea    GERD (gastroesophageal reflux disease)    Heart murmur    History of blood transfusion    "related to my brain surgery I think"   History of pulmonary embolism 07/09/2015   History of radiation therapy 04/09/20-04/22/20   SBRT Left Lung, Dr. Gery Pray   HIV antibody positive ([place])    HIV disease ([place])    Hyperlipidemia    Hypertension    Leg edema    Lipodystrophy    Mild CAD 2013   Multiple thyroid nodules    Osteopenia    Palpitations    Pneumonia 07/09/2015   Shingles    Vitamin B 12 deficiency    Vitamin D deficiency    Review of Systems:   ROS   Physical Exam: There were no vitals filed for this visit.   General: *** HEENT: Conjunctiva nl , antiicteric sclerae, moist mucous membranes, no exudate or erythema Cardiovascular: Normal rate, regular rhythm.  No murmurs, rubs, or gallops Pulmonary : Equal breath sounds, No wheezes, rales, or rhonchi Abdominal: soft,  nontender,  bowel sounds present Ext: No edema in lower extremities, no tenderness to palpation of lower extremities.   Assessment & Plan:   See Encounters Tab for problem based charting.  Patient {GC/GE:[id number]::"discussed with","seen with"} Dr. {NOMVE:[id number]::"J. Hoffman","Guilloud","Mullen","Narendra","Raines","Vincent","Williams"}

## 2021-08-29 LAB — COMPLETE METABOLIC PANEL WITH GFR
AG Ratio: 1.5 (calc) (ref 1.0–2.5)
ALT: 12 U/L (ref 6–29)
AST: 20 U/L (ref 10–35)
Albumin: 4.1 g/dL (ref 3.6–5.1)
Alkaline phosphatase (APISO): 137 U/L (ref 37–153)
BUN/Creatinine Ratio: 13 (calc) (ref 6–22)
BUN: 20 mg/dL (ref 7–25)
CO2: 32 mmol/L (ref 20–32)
Calcium: 10.2 mg/dL (ref 8.6–10.4)
Chloride: 101 mmol/L (ref 98–110)
Creat: 1.49 mg/dL — ABNORMAL HIGH (ref 0.50–1.05)
Globulin: 2.7 g/dL (calc) (ref 1.9–3.7)
Glucose, Bld: 154 mg/dL — ABNORMAL HIGH (ref 65–99)
Potassium: 4.7 mmol/L (ref 3.5–5.3)
Sodium: 141 mmol/L (ref 135–146)
Total Bilirubin: 0.5 mg/dL (ref 0.2–1.2)
Total Protein: 6.8 g/dL (ref 6.1–8.1)
eGFR: 38 mL/min/{1.73_m2} — ABNORMAL LOW (ref >=60)

## 2021-08-29 LAB — CBC WITH DIFFERENTIAL/PLATELET
Absolute Monocytes: 306 cells/uL (ref 200–950)
Basophils Absolute: 33 cells/uL (ref 0–200)
Basophils Relative: 0.5 %
Eosinophils Absolute: 91 cells/uL (ref 15–500)
Eosinophils Relative: 1.4 %
HCT: 39 % (ref 35.0–45.0)
Hemoglobin: 12.9 g/dL (ref 11.7–15.5)
Lymphs Abs: 1092 cells/uL (ref 850–3900)
MCH: 30.6 pg (ref 27.0–33.0)
MCHC: 33.1 g/dL (ref 32.0–36.0)
MCV: 92.4 fL (ref 80.0–100.0)
MPV: 11 fL (ref 7.5–12.5)
Monocytes Relative: 4.7 %
Neutro Abs: 4979 cells/uL (ref 1500–7800)
Neutrophils Relative %: 76.6 %
Platelets: 193 10*3/uL (ref 140–400)
RBC: 4.22 10*6/uL (ref 3.80–5.10)
RDW: 13.1 % (ref 11.0–15.0)
Total Lymphocyte: 16.8 %
WBC: 6.5 10*3/uL (ref 3.8–10.8)

## 2021-08-29 LAB — HIV-1 RNA QUANT-NO REFLEX-BLD
HIV 1 RNA Quant: 21 Copies/mL — ABNORMAL HIGH
HIV-1 RNA Quant, Log: 1.32 Log cps/mL — ABNORMAL HIGH

## 2021-08-29 LAB — RPR: RPR Ser Ql: NONREACTIVE

## 2021-08-29 NOTE — Assessment & Plan Note (Deleted)
HPI summary: presents for follow up of chest wall pain. Located bilaterally now. No other change from prior. See HPI for details Assessment: suspect that this is costochondritis or soft tissue in nature. Low suspicion for this to be radiation related. She has been more sedentary recently and reports sitting at her computer for long hours throughout the day. She has been considering starting wa

## 2021-08-29 NOTE — Progress Notes (Signed)
Office Visit   Patient ID: Shannon [name], female    DOB: May 10, 1956, 66 y.o.   MRN: [id number]   PCP: [name], MD   Subjective:  Shannon [name] is a 66 y.o. year old female who presents for follow up of chest wall pain. Today, she notes that the pain was primarily left sided during her last office visit with Dr. Court Joy. Since then, she has been experiencing a similar discomfort on the right side, although the left side remains more prominent. Pain is constant but improved with tylenol. Exacerbated by movement. No associated symptoms. No recent trauma. We reviewed her rib xray that was obtained at her LOV, which showed questionable mildly displaced right 10th rib fx.  She notes that she has been more sedentary recently and has been considering starting water aerobics. She also asked me to send in a prescription for a full back brace.   Objective:   BP 122/70 (BP Location: Left Arm, Patient Position: Sitting, Cuff Size: Large)    Pulse 98    Temp 98.4 F (36.9 C) (Oral)    Resp (!) 24    Ht 5\' 3"  (1.6 m)    Wt 234 lb 11.2 oz (106.5 kg)    SpO2 93% Comment: room air   BMI 41.58 kg/m   General: chronically ill appearing female in no distress CV: heart regular rate. +1 pitting LE edema with chronic venous stasis skin changes MSK: no obvious deformity over the chest wall. Exquisite focal tenderness over the posterior mid thoracic chest wall bilaterally.  Assessment & Plan:   Problem List Items Addressed This Visit       Cardiovascular and Mediastinum   (HFpEF) heart failure with preserved ejection fraction (HCC)    Lasix held at her LOV with Dr. Court Joy 12/27 due to concern for dehydration. Weight is stable in the office today. She does have +1 LE edema. No LE noted at the time of her visit of 12/27 however patient feels that the edema is her baseline. She denies worsening CHF sx. Will continue holding lasix and have her continue to monitor her volume status. If she starts to become volume  overloaded again, we can resume lasix as needed.  Blood pressure is at goal in the office today. Labs from 1/4 reviewed. Spironolactone refilled.       Relevant Medications   spironolactone (ALDACTONE) 25 MG tablet     Other   Acute right-sided thoracic back pain   Relevant Medications   cyclobenzaprine (FLEXERIL) 5 MG tablet   Chest wall pain    HPI summary: presents for follow up of chest wall pain. Located bilaterally now. No other change from prior. See HPI for details Assessment:  Suspect that this is costochondritis or soft tissue in nature. She already has a chest CT scheduled for 09/29/21 for regular lung cancer surveillance. I do not think there is an indication for more urgent imaging but am hopeful that the CT will evaluate for any alternative cause of the pain. She was concerned that she could have another rib fracture and that she may need surgery or something for it. I explained that almost all rib fractures are treated supportively, with the exception of largely displaced ones, which would not be congruent with her clinical context.  I suspect that she is developing additional pain as a consequence of increase sedentary activity. I encouraged her to proceed with water aerobics. I declined her request for a full back brace and we  discussed the reasoning for that.  Plan Flexeril 2.5mg -5mg  as needed, continue tylenol, no NSAIDs due to renal disease Follow up with PCP for ongoing monitoring after completion of the CT Slowly increase physical activity via water aerobics       Pt discussed with Dr. Marty Heck, MD Internal Medicine Resident PGY-3 [facility] 08/29/2021 2:34 PM

## 2021-08-29 NOTE — Assessment & Plan Note (Addendum)
·   Lasix held at her LOV with Dr. Court Joy 12/27 due to concern for dehydration. Weight is stable in the office today. She does have +1 LE edema. No LE noted at the time of her visit of 12/27 however patient feels that the edema is her baseline. She denies worsening CHF sx. Will continue holding lasix and have her continue to monitor her volume status. If she starts to become volume overloaded again, we can resume lasix as needed.   Blood pressure is at goal in the office today. Labs from 1/4 reviewed. Spironolactone refilled.

## 2021-08-29 NOTE — Assessment & Plan Note (Addendum)
HPI summary: presents for follow up of chest wall pain. Located bilaterally now. No other change from prior. See HPI for details Assessment:  Suspect that this is costochondritis or soft tissue in nature. She already has a chest CT scheduled for 09/29/21 for regular lung cancer surveillance. I do not think there is an indication for more urgent imaging but am hopeful that the CT will evaluate for any alternative cause of the pain. She was concerned that she could have another rib fracture and that she may need surgery or something for it. I explained that almost all rib fractures are treated supportively, with the exception of largely displaced ones, which would not be congruent with her clinical context.  I suspect that she is developing additional pain as a consequence of increase sedentary activity. I encouraged her to proceed with water aerobics. I declined her request for a full back brace and we discussed the reasoning for that.  Plan  Flexeril 2.5mg -5mg  as needed, continue tylenol, no NSAIDs due to renal disease  Follow up with PCP for ongoing monitoring after completion of the CT  Slowly increase physical activity via water aerobics

## 2021-08-30 NOTE — Progress Notes (Signed)
Internal Medicine Clinic Attending  Case discussed with Dr. Christian  At the time of the visit.  We reviewed the resident's history and exam and pertinent patient test results.  I agree with the assessment, diagnosis, and plan of care documented in the resident's note.  

## 2021-08-31 ENCOUNTER — Other Ambulatory Visit: Payer: Self-pay | Admitting: Internal Medicine

## 2021-09-01 DIAGNOSIS — K219 Gastro-esophageal reflux disease without esophagitis: Secondary | ICD-10-CM | POA: Diagnosis not present

## 2021-09-01 DIAGNOSIS — N281 Cyst of kidney, acquired: Secondary | ICD-10-CM | POA: Diagnosis not present

## 2021-09-01 DIAGNOSIS — I129 Hypertensive chronic kidney disease with stage 1 through stage 4 chronic kidney disease, or unspecified chronic kidney disease: Secondary | ICD-10-CM | POA: Diagnosis not present

## 2021-09-01 DIAGNOSIS — M810 Age-related osteoporosis without current pathological fracture: Secondary | ICD-10-CM | POA: Diagnosis not present

## 2021-09-01 DIAGNOSIS — E785 Hyperlipidemia, unspecified: Secondary | ICD-10-CM | POA: Diagnosis not present

## 2021-09-01 DIAGNOSIS — N1831 Chronic kidney disease, stage 3a: Secondary | ICD-10-CM | POA: Diagnosis not present

## 2021-09-02 ENCOUNTER — Ambulatory Visit: Payer: Medicare Other | Admitting: Gastroenterology

## 2021-09-04 ENCOUNTER — Ambulatory Visit: Payer: Medicare Other | Admitting: Nurse Practitioner

## 2021-09-04 ENCOUNTER — Ambulatory Visit (INDEPENDENT_AMBULATORY_CARE_PROVIDER_SITE_OTHER): Payer: Commercial Managed Care - HMO | Admitting: Physician Assistant

## 2021-09-04 VITALS — BP 138/80 | HR 93 | Ht 63.0 in | Wt 227.5 lb

## 2021-09-04 DIAGNOSIS — K219 Gastro-esophageal reflux disease without esophagitis: Secondary | ICD-10-CM | POA: Diagnosis not present

## 2021-09-04 DIAGNOSIS — R109 Unspecified abdominal pain: Secondary | ICD-10-CM | POA: Diagnosis not present

## 2021-09-04 DIAGNOSIS — R140 Abdominal distension (gaseous): Secondary | ICD-10-CM | POA: Diagnosis not present

## 2021-09-04 DIAGNOSIS — G8929 Other chronic pain: Secondary | ICD-10-CM

## 2021-09-04 DIAGNOSIS — K5909 Other constipation: Secondary | ICD-10-CM | POA: Diagnosis not present

## 2021-09-04 MED ORDER — FAMOTIDINE 40 MG PO TABS: 40.0000 mg | ORAL_TABLET | Freq: Every day | ORAL | 11 refills | Status: DC

## 2021-09-04 NOTE — Patient Instructions (Addendum)
If you are age 68 or older, your body mass index should be between 23-30. Your Body mass index is 40.3 kg/m. If this is out of the aforementioned range listed, please consider follow up with your Primary Care Provider. ________________________________________________________  The [facility] [facility] providers would like to encourage you to use [facility] to communicate with providers for non-urgent requests or questions.  Due to long hold times on the telephone, sending your provider a message by [facility] may be a faster and more efficient way to get a response.  Please allow 48 business hours for a response.  Please remember that this is for non-urgent requests.  _______________________________________________________  STOP Omeprazole START Famotidine 40 mg 1 tablet every morning.  Continue Milk of Magnesium four times daily as needed for constipation  Call the office in a few weeks to schedule a follow up with [name].  Thank you for entrusting me with your care and choosing [facility].  Amy Esterwood, PA-C

## 2021-09-05 ENCOUNTER — Ambulatory Visit (HOSPITAL_BASED_OUTPATIENT_CLINIC_OR_DEPARTMENT_OTHER): Payer: Medicare Other | Admitting: Cardiovascular Disease

## 2021-09-05 DIAGNOSIS — M5451 Vertebrogenic low back pain: Secondary | ICD-10-CM | POA: Diagnosis not present

## 2021-09-05 DIAGNOSIS — J449 Chronic obstructive pulmonary disease, unspecified: Secondary | ICD-10-CM | POA: Diagnosis not present

## 2021-09-08 ENCOUNTER — Encounter: Payer: Self-pay | Admitting: Physician Assistant

## 2021-09-08 ENCOUNTER — Ambulatory Visit: Payer: Medicare Other | Admitting: Pulmonary Disease

## 2021-09-08 NOTE — Progress Notes (Addendum)
Subjective:    Patient ID: Shannon [name], female    DOB: [birth year], 66 y.o.   MRN: [id number]  HPI Shannon [name] is a 66 year old African-American female established with [name], who comes in today with complaints of fullness and discomfort in her upper abdomen and constipation. She was last seen in the office in February 2021.  She does have history of chronic constipation and chronic abdominal pain.  Decision had been made previously not to pursue surveillance colonoscopies due to her multiple comorbidities. She has history of hypertension, obesity, HIV with previous undetectable viral load, chronic respiratory failure/COPD on home O2, history of non-small cell lung cancer, chronic fatigue syndrome, asthma, atrial fib/ flutter. Patient has a multitude of complaints today most of these non-GI.  She says she has been having a very difficult time sleeping and is not thinking well and has been having memory issues.  She says she has a lot of specialists and has a hard time keeping up with all of her problems.  She is concerned about "waking up" her HIV.  She has been on Biktarvy with long time nondetectable viral load.  Reviewing her chart she had labs done on 08/27/2021 with HIV RNA quant of 21/log 1.32 08/27/2021 BUN 20/creatinine 1.49, LFTs within normal limits, and normal CBC.[name] Her primary complaint today is mid back pain which she says she is feeling between her shoulder blades.  She is concerned about a fracture and says she is waiting to have a CT of her thoracic spine.  She is also been noticing a lot of fullness in her upper abdomen and a bloated sensation which is fairly chronic for her.  She says her bowel movements are doing okay at this point she is using milk of magnesia every other day which does produce a bowel movement. She also has a note with her today from her nephrologist regarding stopping PPI and starting an H2 blocker if felt appropriate from our standpoint.  Patient has no current  complaints of heartburn or indigestion, no dysphagia or odynophagia..  Patient had MRI of the abdomen without contrast July 2022 showing unremarkable gallbladder, sigmoid diverticulosis, aortoiliac atherosclerotic vascular disease, there is some edema within and around the medial aspect of the right 10th rib suspicious for fracture and numerous bilateral renal lesions of varying complexity favoring benign appearance likely representing enlargement of benign cysts.    Review of Systems Pertinent positive and negative review of systems were noted in the above HPI section.  All other review of systems was otherwise negative.   Outpatient Encounter Medications as of 09/04/2021  Medication Sig   acetaminophen (TYLENOL) 500 MG tablet Take 2 tablets (1,000 mg total) by mouth every 6 (six) hours as needed.   albuterol (PROVENTIL) (2.5 MG/3ML) 0.083% nebulizer solution Take 3 mLs (2.5 mg total) by nebulization every 4 (four) hours as needed for wheezing or shortness of breath.   albuterol (VENTOLIN HFA) 108 (90 Base) MCG/ACT inhaler Inhale into the lungs every 6 (six) hours as needed for wheezing or shortness of breath.   Ascorbic Acid (VITAMIN C) 1000 MG tablet Take 1,000 mg by mouth every other day.    b complex vitamins tablet Take 1 tablet by mouth daily.   BIKTARVY 50-200-25 MG TABS tablet TAKE 1 TABLET BY MOUTH DAILY   BIOTIN PO Take 1 tablet by mouth daily as needed (for supplementation).    calcium carbonate (OSCAL) 1500 (600 Ca) MG TABS tablet Take 600 mg of elemental calcium by mouth  daily with breakfast.   Cholecalciferol (SM VITAMIN D3) 100 MCG (4000 UT) CAPS Take 1 capsule (4,000 Units total) by mouth daily.   cyanocobalamin (,VITAMIN B-12,) 1000 MCG/ML injection ADMINISTER 1 ML(1000 MCG) IN THE MUSCLE EVERY 30 DAYS   cyclobenzaprine (FLEXERIL) 5 MG tablet Take 0.5-1 tablets (2.5-5 mg total) by mouth as needed for muscle spasms.   diltiazem (CARDIZEM CD) 240 MG 24 hr capsule TAKE 1  CAPSULE(240 MG) BY MOUTH DAILY   famotidine (PEPCID) 40 MG tablet Take 1 tablet (40 mg total) by mouth daily.   fluticasone furoate-vilanterol (BREO ELLIPTA) 100-25 MCG/ACT AEPB Inhale 1 puff into the lungs daily.   furosemide (LASIX) 20 MG tablet TAKE 1 TABLET(20 MG) BY MOUTH DAILY AS NEEDED   MAGNESIUM PO Take 1 tablet by mouth every other day.    Melatonin 5 MG TABS Take 5 mg by mouth at bedtime as needed (for sleep).   Mouthwashes (BIOTENE DRY MOUTH GENTLE) LIQD Use as directed 1 Dose in the mouth or throat 2 (two) times daily as needed (dry mouth).   Omega-3 Fatty Acids (FISH OIL) 1000 MG CAPS Take 1,000 mg by mouth daily as needed (pt prefrence).   OXYGEN Inhale 1.5-2 L into the lungs See admin instructions. Uses at bedtime and through the day as needed   spironolactone (ALDACTONE) 25 MG tablet Take 1 tablet (25 mg total) by mouth daily.   XARELTO 20 MG TABS tablet TAKE 1 TABLET(20 MG) BY MOUTH DAILY   zinc gluconate 50 MG tablet Take 50 mg by mouth daily.   diclofenac Sodium (VOLTAREN) 1 % GEL Apply 4 g topically 4 (four) times daily. (Patient taking differently: Apply 4 g topically 4 (four) times daily. prn)   Misc. Devices (PULSE OXIMETER FOR FINGER) MISC 1 Units by Does not apply route as needed.   senna (SENOKOT) 8.6 MG tablet Take 1 tablet by mouth daily as needed for constipation. (Patient not taking: Reported on 07/03/2021)   SYRINGE/NEEDLE, DISP, 1 ML (B-D SYRINGE/NEEDLE 1CC/25GX5/8) 25G X 5/8" 1 ML MISC 1 Units by Does not apply route every 30 (thirty) days.   [DISCONTINUED] omeprazole (PRILOSEC) 40 MG capsule Take 1 capsule (40 mg total) by mouth daily as needed (heart burn). (Patient not taking: Reported on 09/04/2021)   [DISCONTINUED] PROAIR HFA 108 (90 Base) MCG/ACT inhaler Inhale 2 puffs into the lungs every 4 (four) hours as needed for wheezing or shortness of breath. (Patient not taking: Reported on 09/04/2021)   No facility-administered encounter medications on file as of  09/04/2021.   Allergies  Allergen Reactions   Tree Extract Swelling and Other (See Comments)    Swelling to eyes   Augmentin [Amoxicillin-Pot Clavulanate] Other (See Comments)    Headache, dizzy   Lisinopril Cough    Face/throat swelling   Ciprofloxacin Hives   Patient Active Problem List   Diagnosis Date Noted   Chest wall pain 08/19/2021   Acute right-sided thoracic back pain 08/12/2021   Intercostal muscle strain 04/14/2021   Rib fracture 03/12/2021   Osteoporosis 03/12/2021   Pyelonephritis 02/27/2021   Panniculitis 01/23/2021   Healthcare maintenance 01/23/2021   On supplemental oxygen therapy 11/19/2020   Insomnia 11/14/2020   Burn erythema of breast, initial encounter 11/05/2020   Skin ulcer ([place]) 06/24/2020   Bronchitis 03/07/2020   Pulmonary nodule 02/14/2020   Nodule of upper lobe of left lung 02/06/2020   Thyroid nodule 01/26/2020   Adenocarcinoma of lung ([place]) 01/04/2020   Chronic obstructive asthma ([place]) 12/29/2019   (  HFpEF) heart failure with preserved ejection fraction ([place]) 02/07/2019   Vitamin D deficiency 06/30/2018   Chronic anticoagulation 01/03/2018   Chronic respiratory failure with hypoxia ([place]) 11/30/2017   Atrial flutter ([place]) 03/11/2017   CAD (coronary artery disease) 01/28/2017   Obesity (BMI 30-39.9) 01/13/2017   Ascending aorta dilatation ([place]) 01/07/2017   Low back pain radiating to right lower extremity 12/02/2016   Acquired cyst of kidney 12/02/2016   Chronic kidney disease (CKD), stage III (moderate) (H[facility]) 12/01/2016   Generalized anxiety disorder 10/22/2016   Hypersomnia 10/01/2016   Accessory skin tags 06/10/2016   Nocturnal hypoxemia 05/13/2016   Cervical radiculopathy 04/14/2016   GERD (gastroesophageal reflux disease) 01/02/2016   Morbid (severe) obesity due to excess calories ([place]) 10/10/2015   Vitamin B12 deficiency 10/02/2015   Severe persistent asthma with (acute) exacerbation 07/09/2015   HIV disease ([place]) 07/09/2015    Uncontrolled hypertension 07/09/2015   Social History   Socioeconomic History   Marital status: Single    Spouse name: Not on file   Number of children: Not on file   Years of education: Not on file   Highest education level: Not on file  Occupational History   Occupation: student  Tobacco Use   Smoking status: Never   Smokeless tobacco: Never  Vaping Use   Vaping Use: Never used  Substance and Sexual Activity   Alcohol use: Not Currently    Alcohol/week: 0.0 standard drinks    Comment: Rarely.   Drug use: No   Sexual activity: Not Currently    Partners: Male    Comment: declined condoms  Other Topics Concern   Not on file  Social History Narrative   Originally from [place] then [place] now [place] 2016 approx   Disabled    Rare alcohol, never smoker, no drug use            Social Determinants of Radio broadcast assistant Strain: Not on file  Food Insecurity: No Food Insecurity   Worried About Charity fundraiser in the Last Year: Never true   Arboriculturist in the Last Year: Never true  Transportation Needs: Not on file  Physical Activity: Inactive   Days of Exercise per Week: 0 days   Minutes of Exercise per Session: 0 min  Stress: Not on file  Social Connections: Not on file  Intimate Partner Violence: Not on file    Ms. Roswell's family history includes Asthma in her mother; Diabetes in her sister; Heart disease in her mother; Heart failure in her mother; Heart murmur in her brother, sister, and sister; Hypertension in her mother; Obesity in her mother; Thyroid disease in her mother and sister.      Objective:    Vitals:   09/04/21 1123  BP: 138/80  Pulse: 93  SpO2: 91%    Physical Exam Well-developed well-nourished older African-American female in no acute distress.  Patient comes in in a wheelchair, Weight, 227 BMI 40.3  HEENT; nontraumatic normocephalic, EOMI, PE R LA, sclera anicteric. Oropharynx; not examined today Neck; supple, no JVD Cardiovascular;  regular rate and rhythm with S1-S2, no murmur rub or gallop Pulmonary; Clear bilaterally Abdomen; soft, obese , no focal tenderness, no guarding or rebound, nondistended, no palpable mass or hepatosplenomegaly, bowel sounds are active Rectal; not examined today Skin; benign exam, no jaundice rash or appreciable lesions Extremities; no clubbing cyanosis or edema skin warm and dry Neuro/Psych; alert and oriented x4, grossly nonfocal mood and affect appropriate, rambling historian  Assessment & Plan:   #84 66 year old African-American female with multiple comorbidities here for follow-up today.  Chronic complaint of fullness and bloating in the upper abdomen. MRI July 2022 abdomen no concerning findings in the upper abdomen, I think some of the bloating and fullness is secondary to chronic narcotic use and may have a component of delayed gastric emptying, she also has adipose tissue in the upper abdomen which may be contributing. #2 chronic constipation-stable with use of milk of magnesia every other day #3 history of chronic GERD-stable on omeprazole- #4 COPD on home O2 #5 history of non-small cell lung cancer #6 Poor mobility #7HIV on Biktarvy #8hypertension #9 Chronic fatigue #10 History of atrial fib #11 chronic anticoagulation-on Xarelto  Plan; stop omeprazole and switch to famotidine 40 mg p.o. every morning AC breakfast, have sent new prescription Continue milk of magnesia 30 to 45 cc every other day for constipation. Could consider gastric emptying study but I do not think she could tolerate lying on the table for 4 hours at this time. We will plan to follow-up with [name] in about 3 months. Have most of patient's complaints today were non-GI, patient does have a host of serious comorbidities and a lot of underlying anxiety relating to her health.  Alexea Blase S Leronda Lewers PA-C 09/08/2021   Cc: [name], MD   GI Attending  I do not think a gastric emptying study  would help. She has numerous co-morbidities + has likely medication side effects. There is only so much we can do here.  Gatha Mayer, MD, [facility]

## 2021-09-10 DIAGNOSIS — M4317 Spondylolisthesis, lumbosacral region: Secondary | ICD-10-CM | POA: Diagnosis not present

## 2021-09-10 DIAGNOSIS — M5416 Radiculopathy, lumbar region: Secondary | ICD-10-CM | POA: Diagnosis not present

## 2021-09-11 ENCOUNTER — Other Ambulatory Visit: Payer: Self-pay

## 2021-09-11 ENCOUNTER — Telehealth (INDEPENDENT_AMBULATORY_CARE_PROVIDER_SITE_OTHER): Payer: Medicare Other | Admitting: Internal Medicine

## 2021-09-11 DIAGNOSIS — B20 Human immunodeficiency virus [HIV] disease: Secondary | ICD-10-CM | POA: Diagnosis not present

## 2021-09-11 NOTE — Progress Notes (Signed)
Virtual Visit via Telephone Note  I connected with Shannon [name] on 09/11/21 at  2:15 PM EST by telephone and verified that I am speaking with the correct person using two identifiers.  Location: Patient: at home Provider: in clinic   I discussed the limitations, risks, security and privacy concerns of performing an evaluation and management service by telephone and the availability of in person appointments. I also discussed with the patient that there may be a patient responsible charge related to this service. The patient expressed understanding and agreed to proceed.   History of Present Illness: Started to see dr brooks for herniated disc. Had steroid injection. Did not tolerate pain medication. Starting on physical therapy-water therapy. However going to start wearing brace to decrease back pain. They did discuss breast reduction  Doing well with hiv medication.   Observations/Objective: Fluid speech  Assessment and Plan: - needs referral for breast reduction to help minimize back pain - also follows it dr Arn Medal for pulm  - has upcoming back mri  Follow Up Instructions:    I discussed the assessment and treatment plan with the patient. The patient was provided an opportunity to ask questions and all were answered. The patient agreed with the plan and demonstrated an understanding of the instructions.   The patient was advised to call back or seek an in-person evaluation if the symptoms worsen or if the condition fails to improve as anticipated.  I provided 20 minutes of non-face-to-face time during this encounter.   [name], MD

## 2021-09-15 ENCOUNTER — Other Ambulatory Visit: Payer: Self-pay | Admitting: Pain Medicine

## 2021-09-15 DIAGNOSIS — M5416 Radiculopathy, lumbar region: Secondary | ICD-10-CM

## 2021-09-19 ENCOUNTER — Other Ambulatory Visit: Payer: Self-pay

## 2021-09-19 ENCOUNTER — Encounter: Payer: Self-pay | Admitting: Internal Medicine

## 2021-09-19 ENCOUNTER — Ambulatory Visit (HOSPITAL_COMMUNITY)
Admission: RE | Admit: 2021-09-19 | Discharge: 2021-09-19 | Disposition: A | Discharge status: Home / Self Care | Payer: Medicare Other | Source: Ambulatory Visit | Attending: Internal Medicine | Admitting: Internal Medicine

## 2021-09-19 ENCOUNTER — Ambulatory Visit (INDEPENDENT_AMBULATORY_CARE_PROVIDER_SITE_OTHER): Payer: Medicare Other | Admitting: Internal Medicine

## 2021-09-19 VITALS — BP 153/84 | HR 86 | Temp 98.1°F | Ht 67.0 in | Wt 229.1 lb

## 2021-09-19 DIAGNOSIS — I110 Hypertensive heart disease with heart failure: Secondary | ICD-10-CM | POA: Diagnosis not present

## 2021-09-19 DIAGNOSIS — M5412 Radiculopathy, cervical region: Secondary | ICD-10-CM

## 2021-09-19 DIAGNOSIS — G4734 Idiopathic sleep related nonobstructive alveolar hypoventilation: Secondary | ICD-10-CM

## 2021-09-19 DIAGNOSIS — S2231XD Fracture of one rib, right side, subsequent encounter for fracture with routine healing: Secondary | ICD-10-CM

## 2021-09-19 DIAGNOSIS — M549 Dorsalgia, unspecified: Secondary | ICD-10-CM | POA: Diagnosis not present

## 2021-09-19 DIAGNOSIS — M545 Low back pain, unspecified: Secondary | ICD-10-CM | POA: Insufficient documentation

## 2021-09-19 DIAGNOSIS — M2578 Osteophyte, vertebrae: Secondary | ICD-10-CM | POA: Diagnosis not present

## 2021-09-19 DIAGNOSIS — M79604 Pain in right leg: Secondary | ICD-10-CM | POA: Diagnosis not present

## 2021-09-19 DIAGNOSIS — R1013 Epigastric pain: Secondary | ICD-10-CM | POA: Diagnosis not present

## 2021-09-19 DIAGNOSIS — I483 Typical atrial flutter: Secondary | ICD-10-CM | POA: Diagnosis not present

## 2021-09-19 DIAGNOSIS — I5032 Chronic diastolic (congestive) heart failure: Secondary | ICD-10-CM | POA: Diagnosis not present

## 2021-09-19 DIAGNOSIS — M542 Cervicalgia: Secondary | ICD-10-CM | POA: Diagnosis not present

## 2021-09-19 DIAGNOSIS — I10 Essential (primary) hypertension: Secondary | ICD-10-CM

## 2021-09-19 DIAGNOSIS — M546 Pain in thoracic spine: Secondary | ICD-10-CM | POA: Diagnosis not present

## 2021-09-19 MED ORDER — PREGABALIN 25 MG PO CAPS: 25.0000 mg | ORAL_CAPSULE | Freq: Two times a day (BID) | ORAL | 3 refills | Status: DC

## 2021-09-19 NOTE — Progress Notes (Signed)
° °Subjective:  ° ° Patient ID: Shannon [name], female    DOB: 04/22/1956, 66 y.o.   MRN: [id number] ° °CC: Follow up for back pain ° °HPI ° °Shannon [name] is a 66 year old woman who presents with multiple complaints.  She has a history of Aflutter, HTN, HFpEF, back pain.   ° °Orthopedic appointment - Dr. Brooks, ordered an MRI.  Xray, lower spine, noted disc disease, probably pressing on a nerve.  Ordered PT, water aerobics, pain management.  Gave a back brace to keep her aligned.  Pain is radiating around to the abdomen.  Numbness in the right foot.  She also notes hair loss, neck pain and irritation at the site of previous brain surgery in the 1970s.  She has strong pain in her back. She did not take pain medication today.  Starts PT Oct 13, 2021 ° °Rib fracture, osteoporosis due to fragility fracture and this greatly concerns her.  She continues to have point tenderness at the site of previous fracture.    ° °Her main concern is "waking up the HIV."  She saw Dr. Snider.  Numbers are down and she has cut many things out of her diet to help with this.    ° °Decreased sleep due to pain.  ° °She has issues with pain in her neck at the site of a fatty deposit.  She feels this is radiating into her brain and moving around.  She has seen neurology related to this.   ° °Dr. Kinard is her radiology oncologist and she is following with him in regards to her lung cancer therapy.   ° °She notes that she is losing weight, not feeling well generally, but pain is her main concern.  ° °Consulting physicians:  °Rad Onc: H/o lung cancer, lung nodule --> Dr. Kinard °Orthopedics: Back pain, PT --> Dr. Brooks °Cardiology: CAD, HTN, Aflutter --> Dr. [place] °Pulmonary: Severe asthma, chronic respiratory failure --> Dr. Alva °ID: HIV --> Dr. Snider °GI: GERD, abdominal pain --> Dr. Gessner ° ° °Review of Systems  °Constitutional:  Positive for activity change and fatigue. Negative for appetite change, chills and diaphoresis.  °Respiratory:   Negative for cough and shortness of breath.   °Cardiovascular:  Positive for palpitations and leg swelling (comes and goes). Negative for chest pain.  °Gastrointestinal:  Positive for abdominal pain. Negative for constipation and diarrhea.  °Musculoskeletal:  Positive for arthralgias, back pain, gait problem, joint swelling, myalgias and neck pain.  °Skin:  Negative for color change, pallor and wound.  °Neurological:  Positive for dizziness, weakness and headaches. Negative for syncope.  °Psychiatric/Behavioral:  Positive for dysphoric mood and sleep disturbance. Negative for confusion and decreased concentration. The patient is nervous/anxious.   ° °   °Objective:  ° Physical Exam °Vitals and nursing note reviewed.  °Constitutional:   °   General: She is not in acute distress. °   Appearance: Normal appearance. She is not toxic-appearing.  °HENT:  °   Head: Normocephalic and atraumatic.  °Cardiovascular:  °   Rate and Rhythm: Normal rate and regular rhythm.  °   Heart sounds: No murmur heard. °Pulmonary:  °   Effort: Pulmonary effort is normal. No respiratory distress.  °   Breath sounds: Normal breath sounds.  °Abdominal:  °   General: Bowel sounds are normal. There is no distension.  °   Palpations: Abdomen is soft. There is no mass.  °   Tenderness: There is abdominal tenderness.  °  Musculoskeletal:        General: Tenderness present. No swelling, deformity or signs of injury.     Cervical back: Neck supple. Tenderness present. No rigidity.     Comments: She has pain to palpation of the right tenth rib, rotation of the pain around to the abdomen in the RUQ, epigastrium.  TTP over the abdomen as well.   Lymphadenopathy:     Cervical: No cervical adenopathy.  Skin:    Coloration: Skin is not jaundiced or pale.     Findings: No erythema or rash.  Neurological:     General: No focal deficit present.     Mental Status: She is alert and oriented to person, place, and time.    C and T spine xrays CRP and  ESR to ensure no infection or other inflammation causing pain      Assessment & Plan:  F/U on March 17 after MRI and seeing orthopedics again.

## 2021-09-19 NOTE — Patient Instructions (Addendum)
Shannon [name] - -   I am so sorry you are struggling with pain.   As Dr. Oval Linsey noted, I think you should start taking your xarelto to decrease your risk of stroke related to your A flutter.   For your pain - please start taking Lyrica (pregabalin) at 25mg  twice per day.  Please also take hydrocodone or topical medications (Voltaren) as needed.   I will do Xrays of your Cspine and T spine today to evaluate for other issues with your discs.    Please come back on March 17 for follow up, after your MRI and seeing Dr. Rolena Infante again

## 2021-09-20 DIAGNOSIS — J449 Chronic obstructive pulmonary disease, unspecified: Secondary | ICD-10-CM | POA: Diagnosis not present

## 2021-09-20 DIAGNOSIS — R911 Solitary pulmonary nodule: Secondary | ICD-10-CM | POA: Diagnosis not present

## 2021-09-20 DIAGNOSIS — J45909 Unspecified asthma, uncomplicated: Secondary | ICD-10-CM | POA: Diagnosis not present

## 2021-09-20 DIAGNOSIS — R269 Unspecified abnormalities of gait and mobility: Secondary | ICD-10-CM | POA: Diagnosis not present

## 2021-09-22 LAB — SEDIMENTATION RATE: Sed Rate: 22 mm/hr (ref 0–40)

## 2021-09-22 LAB — LIPASE: Lipase: 12 U/L — ABNORMAL LOW (ref 14–72)

## 2021-09-22 LAB — C-REACTIVE PROTEIN: CRP: 7 mg/L (ref 0–10)

## 2021-09-23 ENCOUNTER — Telehealth: Payer: Self-pay | Admitting: Internal Medicine

## 2021-09-23 NOTE — Assessment & Plan Note (Signed)
Continue nightly oxygen.

## 2021-09-23 NOTE — Assessment & Plan Note (Addendum)
BP today was elevated on initial and then on retake.  She has been off and on her medications.  She notes taking her medications today, but that she is in pain.  She is not amenable to changes today.   Plan Continue spironolactone, lasix and diltiazem

## 2021-09-23 NOTE — Telephone Encounter (Signed)
Called and spoke with Shannon [name].  Confirmed identity by name and DOB.   Discussed results with her.  She was reassured that her blood work was not concerning.  She is due to get an MRI of her L spine.  She will continue to follow up with [facility] for possible injectable therapy.  She is following up with PT first.   Gilles Chiquito, MD

## 2021-09-23 NOTE — Assessment & Plan Note (Signed)
Per Dr. Blenda Mounts last note, she had stopped taking all of her medications including xarelto b/c she felt she was on too many medications and that maybe her xarelto was contributing to her pain.  She reports taking xarelto now and her refill history is good.  She reports taking her diltiazem as well.

## 2021-09-23 NOTE — Assessment & Plan Note (Addendum)
Continues to be painful, also with DDD.  She is due to get an L spine MRI by her orthopedic surgeon.  She is worried about inflammation, CRP and ESR checked today were normal.   Continue hydrocodone Continue diclofenac gel Start lyrica 34m BID.

## 2021-09-23 NOTE — Assessment & Plan Note (Addendum)
She has no swelling today.  She is breathing well, she is not using her oxygen during the day.  She is taking her lasix by her report.  Continue lasix at current dose.  No current exacerbation.  Wt is stable.

## 2021-09-23 NOTE — Assessment & Plan Note (Signed)
She notes worsening of this today.  She notes pain that goes into her shoulders and up into her neck.  Her main pain, however, is at the umbilicus (H43) and around to her abdomen, also at the site of recent rib fracture.   Plan Trial of lyrica 25mg  BID to see if this helps She has hydrocodone from the orthopedic surgeon as well Diclofenac gel PRN

## 2021-09-24 ENCOUNTER — Ambulatory Visit (HOSPITAL_BASED_OUTPATIENT_CLINIC_OR_DEPARTMENT_OTHER): Payer: Commercial Managed Care - HMO | Admitting: Family

## 2021-09-29 ENCOUNTER — Ambulatory Visit (HOSPITAL_COMMUNITY)
Admission: RE | Admit: 2021-09-29 | Discharge: 2021-09-29 | Disposition: A | Discharge status: Home / Self Care | Payer: Medicare Other | Source: Ambulatory Visit | Attending: Radiation Oncology | Admitting: Radiation Oncology

## 2021-09-29 ENCOUNTER — Ambulatory Visit
Admission: RE | Admit: 2021-09-29 | Discharge: 2021-09-29 | Disposition: A | Discharge status: Home / Self Care | Payer: Medicare Other | Source: Ambulatory Visit | Attending: Pain Medicine | Admitting: Pain Medicine

## 2021-09-29 ENCOUNTER — Other Ambulatory Visit: Payer: Self-pay

## 2021-09-29 DIAGNOSIS — C3492 Malignant neoplasm of unspecified part of left bronchus or lung: Secondary | ICD-10-CM | POA: Diagnosis not present

## 2021-09-29 DIAGNOSIS — R0789 Other chest pain: Secondary | ICD-10-CM | POA: Diagnosis not present

## 2021-09-29 DIAGNOSIS — C3490 Malignant neoplasm of unspecified part of unspecified bronchus or lung: Secondary | ICD-10-CM | POA: Diagnosis not present

## 2021-09-29 DIAGNOSIS — M5416 Radiculopathy, lumbar region: Secondary | ICD-10-CM

## 2021-09-29 DIAGNOSIS — M48061 Spinal stenosis, lumbar region without neurogenic claudication: Secondary | ICD-10-CM | POA: Diagnosis not present

## 2021-09-29 DIAGNOSIS — R911 Solitary pulmonary nodule: Secondary | ICD-10-CM | POA: Diagnosis not present

## 2021-09-29 DIAGNOSIS — R918 Other nonspecific abnormal finding of lung field: Secondary | ICD-10-CM | POA: Diagnosis not present

## 2021-09-29 DIAGNOSIS — I700 Atherosclerosis of aorta: Secondary | ICD-10-CM | POA: Diagnosis not present

## 2021-09-30 NOTE — Progress Notes (Deleted)
Office Visit    Patient Name: Shannon [name] Date of Encounter: 09/30/2021  PCP:  [name], MD   [place]  Cardiologist:  Skeet Latch, MD  Advanced Practice Provider:  No care team member to display Electrophysiologist:  None    Chief Complaint    Shannon [name] is a 66 y.o. female with a hx of stage Ia non-small cell lung cancer on XRT, hypertension, paroxysmal atrial flutter, asthma, medication noncompliance, nonobstructive CAD, OSA, prior PE, moderate pulmonary hypertension, mild ascending aortic aneurysm, PAD, and HIV (well-controlled) who presents today for 67-month follow-up visit.  The patient was initially referred to Dr. Oval Linsey in May 2017 by Dr. [name].  Dr. Graylon Good was concerned about her lower extremity edema and shortness of breath.  She was started on Lasix 20 mg every other day.  Patient had noted shortness of breath after hospitalization for pneumonia in November 2016.  She denied lower extremity edema orthopnea but did report chest pain when laying down at night.  She had an echocardiogram June 2017 which revealed LVEF 60 to 65% with grade 1 DD and hypokinesis of the basal inferior wall.  She also had mild aortic stenosis with a mean gradient of 9 mmHg and trivial AR.  She had a Lexiscan Myoview at that time that was negative for ischemia.  She had a previous Lexiscan Cardiolite in September 2013 that revealed a medium, mild reversible defect in the anterior wall and normal systolic function.  She subsequently underwent cardiac catheterization that revealed nonobstructive coronary disease.  She had mild pulmonary hypertension and normal cardiac output.  She had a 24-hour Holter monitor February 2015 that revealed rare PACs and PVCs.  Echocardiogram May 2018 revealed LVEF 60 to 65% with mild LVH and a mean aortic gradient of 8 mmHg.  Tricuspid regurgitation was not significant enough to assess pulmonary pressures.  She was admitted to  the hospital June 2018 with hypertensive emergency and chest pain.  Her blood pressure was 215/100.  She consistently struggles with medication adherence.  She was found to have an allergy to lisinopril which was stopped due to cough and throat swelling.  More recently, repeat echocardiogram June 2020 revealed LVEF greater than 65%, severe LVH, intracavitary gradient 61 mmHg, mild AS, mild to moderate AR, and mild a sending aortic aneurysm (4.1 cm).  Repeat echocardiogram March 2021 revealed that LVEF 60 to 65% with mild LVH and grade 1 DD.  The ascending aorta was normal on that echo.  She was diagnosed with stage I non-small cell lung cancer and had been managed by IM and pulmonary for a respiratory infection October 2022.  During her last visit in the office November 2022 she woke up with severe pain and was found to have a fracture in her rib.  It was thought to be due to a cough and osteoporosis.  She stopped taking Xarelto due to the concern that it was the cause.  MRI of the abdomen showed atherosclerosis of the aorta.  She stopped taking her medication but cannot explain why.  She stated she was gaining weight due to fluid.  She had been eating salty foods and feeling thirsty so drinking more water than usual.  Today, she ***        Past Medical History    Past Medical History:  Diagnosis Date   Anemia    Anxiety    HX PANIC ATTACKS   Arthritis    "starting to;  in my hands" (07/09/2015)   Asthma    Atrial fibrillation (HCC)    Atrial flutter, paroxysmal (HCC)    Bloated abdomen    CFS (chronic fatigue syndrome)    Chewing difficulty    Chronic asthma with acute exacerbation    "I have chronic asthma all the time; sometimes exacerbations" (07/09/2015)   Chronic lower back pain    COPD (chronic obstructive pulmonary disease) (HCC)    Cyst of right kidney    "3 of them; dx'd in ~ 01/2015"   Dyspnea    GERD (gastroesophageal reflux disease)    Heart murmur    History of blood  transfusion    "related to my brain surgery I think"   History of pulmonary embolism 07/09/2015   History of radiation therapy 04/09/20-04/22/20   SBRT Left Lung, Dr. Gery Pray   HIV antibody positive ([place])    HIV disease ([place])    Hyperlipidemia    Hypertension    Leg edema    Lipodystrophy    Mild CAD 2013   Multiple thyroid nodules    Osteopenia    Palpitations    Pneumonia 07/09/2015   Shingles    Vitamin B 12 deficiency    Vitamin D deficiency    Past Surgical History:  Procedure Laterality Date   ABDOMINAL HYSTERECTOMY     "robotic laparosopic"   BRAIN SURGERY  1974   "brain tumor; benign; on top of my brain; got a plate in there"   BRAIN SURGERY     age 60- -"Tumor pushing my skullout"   BRONCHIAL BIOPSY  02/13/2020   Procedure: BRONCHIAL BIOPSIES;  Surgeon: [name], MD;  Location: [place];  Service: Pulmonary;;   BRONCHIAL BRUSHINGS  02/13/2020   Procedure: BRONCHIAL BRUSHINGS;  Surgeon: [name], MD;  Location: [facility] ENDOSCOPY;  Service: Pulmonary;;   BRONCHIAL NEEDLE ASPIRATION BIOPSY  02/13/2020   Procedure: BRONCHIAL NEEDLE ASPIRATION BIOPSIES;  Surgeon: [name], MD;  Location: [place];  Service: Pulmonary;;   CARDIAC CATHETERIZATION     FIDUCIAL MARKER PLACEMENT  02/13/2020   Procedure: FIDUCIAL MARKER PLACEMENT;  Surgeon: [name], MD;  Location: [facility] ENDOSCOPY;  Service: Pulmonary;;   TONSILLECTOMY AND ADENOIDECTOMY     VIDEO BRONCHOSCOPY WITH ENDOBRONCHIAL NAVIGATION N/A 02/13/2020   Procedure: VIDEO BRONCHOSCOPY WITH ENDOBRONCHIAL NAVIGATION;  Surgeon: [name], MD;  Location: [facility] ENDOSCOPY;  Service: Pulmonary;  Laterality: N/A;    Allergies  Allergies  Allergen Reactions   Tree Extract Swelling and Other (See Comments)    Swelling to eyes   Augmentin [Amoxicillin-Pot Clavulanate] Other (See Comments)    Headache, dizzy   Lisinopril Cough    Face/throat swelling   Ciprofloxacin Hives     EKGs/Labs/Other  Studies Reviewed:   The following studies were reviewed today: Echo 01/06/17: Study Conclusions   - Left ventricle: The cavity size was normal. Wall thickness was   increased in a pattern of mild LVH. Systolic function was normal.   The estimated ejection fraction was in the range of 60% to 65%.   Wall motion was normal; there were no regional wall motion   abnormalities. Doppler parameters are consistent with abnormal   left ventricular relaxation (grade 1 diastolic dysfunction). - Aortic valve: Trileaflet; moderately calcified leaflets.   Sclerosis without stenosis. There was mild to moderate   regurgitation. Mean gradient (S): 8 mm Hg. - Mitral valve: There was no significant regurgitation. - Left atrium: The atrium was moderately dilated. -  Right ventricle: The cavity size was normal. Systolic function   was normal. - Right atrium: The atrium was mildly dilated. - Pulmonary arteries: No complete TR doppler jet so unable to   estimate PA systolic pressure. - Systemic veins: IVC measured 2.2 cm with > 50% respirophasic   variation, suggesting RA pressure 8 mmHg.   Impressions: - Normal LV size with mild LV hypertrophy. EF 60-65%. Calcified,   trileaflet aortic valve. Sclerosis without significant stenosis,   mild to moderate aortic insufficiency. Normal RV size and   systolic function. Biatrial enlargement.   Echo 11/15/19:  1. Left ventricular ejection fraction, by estimation, is 60 to 65%. The  left ventricle has normal function. The left ventricle has no regional  wall motion abnormalities. There is mild left ventricular hypertrophy.  Left ventricular diastolic parameters  are consistent with Grade I diastolic dysfunction (impaired relaxation).   2. Right ventricular systolic function is normal. The right ventricular  size is normal. There is normal pulmonary artery systolic pressure. The  estimated right ventricular systolic pressure is 42.7 mmHg.   3. The mitral valve is  degenerative. No evidence of mitral valve  regurgitation. No evidence of mitral stenosis. There is turbulence across  the mitral valve but it does not appear restricted and there is no  evidence for a significant pressure gradient  across it.   4. The aortic valve is tricuspid. Aortic valve regurgitation is trivial.  Mild aortic valve sclerosis is present, with no evidence of aortic valve  stenosis.   5. The inferior vena cava is normal in size with greater than 50%  respiratory variability, suggesting right atrial pressure of 3 mmHg.    Lower extremity Doppler 03/09/19: Tibial vessel disease; one vessel run-off via peroneal artery.     Summary:  Right: Near normal examination. No hemodynamically significant arterial  stenosis/occlusion noted in the CFA, DFA, SFA, popliteal artery and TPT.   Tibial vessel disease; zero vessel run-off.   Left: Near normal examination. No hemodynamically significant arterial  stenosis/occlusion noted in the CFA, DFA, SFA, popliteal artery and TPT.   Tibial vessel disease; one vessel run-off via peroneal artery.     EKG:  EKG is *** ordered today.  The ekg ordered today demonstrates ***  Recent Labs: 06/30/2021: BNP 58.4 08/27/2021: ALT 12; BUN 20; Creat 1.49; Hemoglobin 12.9; Platelets 193; Potassium 4.7; Sodium 141  Recent Lipid Panel    Component Value Date/Time   CHOL 227 (H) 03/12/2021 1020   TRIG 55 03/12/2021 1020   HDL 68 03/12/2021 1020   CHOLHDL 3.3 03/12/2021 1020   CHOLHDL 3.6 11/10/2019 1055   VLDL 18 11/26/2016 1618   LDLCALC 150 (H) 03/12/2021 1020   LDLCALC 164 (H) 11/10/2019 1055    Risk Assessment/Calculations:  {Does this patient have ATRIAL FIBRILLATION?[phone]}  Home Medications   No outpatient medications have been marked as taking for the 10/01/21 encounter (Appointment) with Loel Dubonnet, NP.     Review of Systems   ***   All other systems reviewed and are otherwise negative except as noted  above.  Physical Exam    VS:  There were no vitals taken for this visit. , BMI There is no height or weight on file to calculate BMI.  Wt Readings from Last 3 Encounters:  09/19/21 229 lb 1.6 oz (103.9 kg)  09/04/21 227 lb 8 oz (103.2 kg)  08/28/21 234 lb 11.2 oz (106.5 kg)     GEN: Well nourished, well developed, in no  acute distress. HEENT: normal. Neck: Supple, no JVD, carotid bruits, or masses. Cardiac: ***RRR, no murmurs, rubs, or gallops. No clubbing, cyanosis, edema.  ***Radials/PT 2+ and equal bilaterally.  Respiratory:  ***Respirations regular and unlabored, clear to auscultation bilaterally. GI: Soft, nontender, nondistended. MS: No deformity or atrophy. Skin: Warm and dry, no rash. Neuro:  Strength and sensation are intact. Psych: Normal affect.  Assessment & Plan    Acute on chronic HFpEF Hypertension Aortic regurgitation/aortic stenosis Hyperlipidemia Atrial flutter Ascending aortic aneurysm PAD Morbid obesity    Disposition: Follow up {follow up:15908} with Skeet Latch, MD or APP.  Signed, Elgie Collard, PA-C 09/30/2021, 11:11 AM [place]

## 2021-10-01 ENCOUNTER — Ambulatory Visit (HOSPITAL_BASED_OUTPATIENT_CLINIC_OR_DEPARTMENT_OTHER): Payer: Medicaid Other | Admitting: Physician Assistant

## 2021-10-01 DIAGNOSIS — E782 Mixed hyperlipidemia: Secondary | ICD-10-CM

## 2021-10-01 DIAGNOSIS — I483 Typical atrial flutter: Secondary | ICD-10-CM

## 2021-10-01 DIAGNOSIS — I5033 Acute on chronic diastolic (congestive) heart failure: Secondary | ICD-10-CM

## 2021-10-01 DIAGNOSIS — I10 Essential (primary) hypertension: Secondary | ICD-10-CM

## 2021-10-01 DIAGNOSIS — I77810 Thoracic aortic ectasia: Secondary | ICD-10-CM

## 2021-10-01 DIAGNOSIS — M7989 Other specified soft tissue disorders: Secondary | ICD-10-CM

## 2021-10-01 DIAGNOSIS — I739 Peripheral vascular disease, unspecified: Secondary | ICD-10-CM

## 2021-10-01 NOTE — Progress Notes (Signed)
Radiation Oncology         [phone] ________________________________  Name: Shannon [name] MRN: [id number]  Date: 10/02/2021  DOB: 1956/07/06  Follow-Up Visit Note  CC: [name], MD  [name], MD    ICD-10-CM   1. Adenocarcinoma of left lung (HCC)  C34.92 NM PET Image Restag (PS) Skull Base To Thigh    Ambulatory referral to Hematology / Oncology      Diagnosis: Stage IA (T1c, N0, M0) non-small cell lung cancer of the left upper lobe, adenocarcinoma  New evidence of bony metastases; ribs, thoracic spine, sacral spine  Interval Since Last Radiation: 1 year, 5 months, and 10 days   Radiation Treatment Dates: 04/09/2020 through 04/22/2020 Site Technique Total Dose (Gy) Dose per Fx (Gy) Completed Fx Beam Energies  Lung, Left: Lung_Lt SBRT 60/60 12 5/5 6XFFF    Narrative:  The patient returns today for routine follow-up and to review recent imaging, she was last seen here for follow up on 07/03/21.   Since her last visit, the patient has had ongoing issues with right sided and lower back pain. On 08/19/21 she met with Dr. Court Joy, [facility], and reported a history of right sided rib fracture. The patient's pain was apparently so severe that physical exam was intolerable. Accordingly, Dr. Court Joy ordered an x-ray of the right ribs on this date which showed a possible displaced fracture involving the right tenth rib (though imaging was limited).   Thoracic and cervical spine x-ray's were also performed on 09/19/21 for back pain which showed no acute abnormalities. However, MRI of the lumbar spine on 09/29/21 showed scattered T1 hypointense lesions within the visualized spine concerning for metastatic disease. The largest lesion was appreciated at S1-2, and appeared to expand to the cortex posteriorly on the right side, impinging on the right S1 nerve root.  Pathologic fracture of the superior endplate of C58 was also visualized without significant height loss and without  retropulsion.          Her most recent chest CT on 09/29/21 demonstrated the mass-like opacity in central left upper lobe as stable. Sub-cm bilateral pulmonary nodules and dominant ill-defined nodular opacity in right lower lobe also remained stable in the interval.  CT also re-demonstrated the new lytic bone metastases involving the thoracic spine and ribs seen on MRI from this same date. Specifically, lytic bone lesions were seen to involve the T9 vertebra, right posterior 10th rib, and bilateral 5th ribs. Several other nondisplaced left rib fractures were also appreciated which may be pathologic and  consistent with lytic bone metastases.      In addition she reports some mild problems with urinary retention and constipation.  She reports some weakness in her right lower extremity.  She has recently been given oxycodone 10 mg tablets which are helping her pain a little better.  She also complains of pain in the upper thoracic spine area.         Allergies:  is allergic to tree extract, augmentin [amoxicillin-pot clavulanate], lisinopril, and ciprofloxacin.  Meds: Current Outpatient Medications  Medication Sig Dispense Refill   albuterol (PROVENTIL) (2.5 MG/3ML) 0.083% nebulizer solution Take 3 mLs (2.5 mg total) by nebulization every 4 (four) hours as needed for wheezing or shortness of breath. 75 mL 2   albuterol (VENTOLIN HFA) 108 (90 Base) MCG/ACT inhaler Inhale into the lungs every 6 (six) hours as needed for wheezing or shortness of breath.     Ascorbic Acid (VITAMIN C)  1000 MG tablet Take 1,000 mg by mouth every other day.      b complex vitamins tablet Take 1 tablet by mouth daily.     BIKTARVY 50-200-25 MG TABS tablet TAKE 1 TABLET BY MOUTH DAILY 30 tablet 5   BIOTIN PO Take 1 tablet by mouth daily as needed (for supplementation).      calcium carbonate (OSCAL) 1500 (600 Ca) MG TABS tablet Take 600 mg of elemental calcium by mouth daily with breakfast.     Cholecalciferol (SM VITAMIN D3)  100 MCG (4000 UT) CAPS Take 1 capsule (4,000 Units total) by mouth daily. 30 capsule 0   cyanocobalamin (,VITAMIN B-12,) 1000 MCG/ML injection ADMINISTER 1 ML(1000 MCG) IN THE MUSCLE EVERY 30 DAYS 3 mL 1   diltiazem (CARDIZEM CD) 240 MG 24 hr capsule TAKE 1 CAPSULE(240 MG) BY MOUTH DAILY 90 capsule 3   famotidine (PEPCID) 40 MG tablet Take 1 tablet (40 mg total) by mouth daily. 30 tablet 11   fluticasone furoate-vilanterol (BREO ELLIPTA) 100-25 MCG/ACT AEPB Inhale 1 puff into the lungs daily. 28 each 2   furosemide (LASIX) 20 MG tablet TAKE 1 TABLET(20 MG) BY MOUTH DAILY AS NEEDED 90 tablet 0   MAGNESIUM PO Take 1 tablet by mouth every other day.      Melatonin 5 MG TABS Take 5 mg by mouth at bedtime as needed (for sleep).     morphine (MS CONTIN) 15 MG 12 hr tablet Take 1 tablet by mouth every 12 hours. 30 tablet 0   Mouthwashes (BIOTENE DRY MOUTH GENTLE) LIQD Use as directed 1 Dose in the mouth or throat 2 (two) times daily as needed (dry mouth).     Omega-3 Fatty Acids (FISH OIL) 1000 MG CAPS Take 1,000 mg by mouth daily as needed (pt prefrence).     Oxycodone HCl 10 MG TABS Take 10 mg by mouth 4 (four) times daily as needed.     OXYGEN Inhale 1.5-2 L into the lungs See admin instructions. Uses at bedtime and through the day as needed     pregabalin (LYRICA) 25 MG capsule Take 1 capsule (25 mg total) by mouth 2 (two) times daily. 60 capsule 3   spironolactone (ALDACTONE) 25 MG tablet Take 1 tablet (25 mg total) by mouth daily. 90 tablet 0   SYRINGE/NEEDLE, DISP, 1 ML (B-D SYRINGE/NEEDLE 1CC/25GX5/8) 25G X 5/8" 1 ML MISC 1 Units by Does not apply route every 30 (thirty) days. 50 each 0   zinc gluconate 50 MG tablet Take 50 mg by mouth daily.     acetaminophen (TYLENOL) 500 MG tablet Take 2 tablets (1,000 mg total) by mouth every 6 (six) hours as needed. (Patient not taking: Reported on 10/02/2021) 100 tablet 2   cyclobenzaprine (FLEXERIL) 5 MG tablet Take 0.5-1 tablets (2.5-5 mg total) by mouth as  needed for muscle spasms. (Patient not taking: Reported on 10/02/2021) 5 tablet 0   diclofenac Sodium (VOLTAREN) 1 % GEL Apply 4 g topically 4 (four) times daily. (Patient not taking: Reported on 10/02/2021) 150 g 0   HYDROcodone-acetaminophen (NORCO/VICODIN) 5-325 MG tablet Take 1 tablet by mouth 3 (three) times daily as needed. (Patient not taking: Reported on 10/02/2021)     Misc. Devices (PULSE OXIMETER FOR FINGER) MISC 1 Units by Does not apply route as needed. (Patient not taking: Reported on 10/02/2021) 1 each 0   XARELTO 20 MG TABS tablet TAKE 1 TABLET(20 MG) BY MOUTH DAILY (Patient not taking: Reported on 09/11/2021) 90 tablet  0   No current facility-administered medications for this encounter.    Physical Findings: The patient is in no acute distress. Patient is alert and oriented.  Emotionally upset concerning her level of pain.  temperature is 97.9 F (36.6 C). Her blood pressure is 124/89 and her pulse is 83. Her respiration is 18 and oxygen saturation is 91%. .   Lungs are clear to auscultation bilaterally. Heart has regular rate and rhythm. No palpable cervical, supraclavicular, or axillary adenopathy. Abdomen soft, non-tender, normal bowel sounds.  Patient does appear to have some weakness in her right lower extremity 3 out of 5 compared to 5 out of 5 in the left side.  The pupils are equal round reactive to light.  The extraocular eye movements are intact.  The tongue is midline.  No secondary infection noted in the oral cavity.  Cranial nerves II through XII appear to be intact.  Upper motor strength is 5 out of 5 in the proximal and distal muscle groups.   Lab Findings: Lab Results  Component Value Date   WBC 6.5 08/27/2021   HGB 12.9 08/27/2021   HCT 39.0 08/27/2021   MCV 92.4 08/27/2021   PLT 193 08/27/2021    Radiographic Findings: DG Cervical Spine Complete  Result Date: 09/20/2021 CLINICAL DATA:  Neck pain with radiation to shoulders. EXAM: CERVICAL SPINE - COMPLETE 4+ VIEW  COMPARISON:  None. FINDINGS: The bones are mildly osteopenic. There is no evidence for acute fracture. Spinal alignment is within normal limits. There is mild disc space narrowing and osteophyte formation throughout all levels of the cervical spine. Prevertebral soft tissues are within normal limits. There are atherosclerotic calcifications of the carotid artery bifurcations bilaterally. IMPRESSION: 1. No acute bony abnormality. 2. Mild/moderate degenerative changes. Electronically Signed   By: Ronney Asters M.D.   On: 09/20/2021 22:12   DG Thoracic Spine 2 View  Result Date: 09/20/2021 CLINICAL DATA:  Mid back pain. EXAM: THORACIC SPINE 2 VIEWS COMPARISON:  None. FINDINGS: There is no evidence of thoracic spine fracture. Alignment is normal. Disc spaces are mildly narrowed with mild disc osteophyte formation compatible with degenerative change. There are surgical clips in the left chest. IMPRESSION: 1. No acute fracture or subluxation. 2. Mild degenerative changes. Electronically Signed   By: Ronney Asters M.D.   On: 09/20/2021 22:13   CT CHEST WO CONTRAST  Result Date: 09/30/2021 CLINICAL DATA:  Follow-up non-small cell lung carcinoma. Right-sided chest wall pain for several weeks. Previous radiation therapy. EXAM: CT CHEST WITHOUT CONTRAST TECHNIQUE: Multidetector CT imaging of the chest was performed following the standard protocol without IV contrast. RADIATION DOSE REDUCTION: This exam was performed according to the departmental dose-optimization program which includes automated exposure control, adjustment of the mA and/or kV according to patient size and/or use of iterative reconstruction technique. COMPARISON:  04/25/2021 FINDINGS: Cardiovascular: No acute findings. Aortic and coronary atherosclerotic calcification noted. Mediastinum/Nodes: Stable enlargement of left thyroid lobe. No other masses or pathologically enlarged lymph nodes identified on this unenhanced exam. Lungs/Pleura: Masslike opacity  in the central left upper lobe containing small areas of central calcification shows no significant change since previous study. This measures approximately 4.8 x 2.0 cm on image 78/2, compared to 4.9 x 2.0 cm previously. Several sub-cm pulmonary nodules are again seen peripherally within the lingula, largest measuring 8 mm on image 74/5. These show no significant change. Other scattered sub-cm pulmonary nodules are again seen throughout both lungs which show no significant change. A dominant poorly defined  nodular opacity is seen in the medial right lower lobe measuring 1.7 x 1.7 cm on image 91/5, without significant change in size when remeasured on previous study. No definite new or enlarging pulmonary nodules or masses identified. No evidence of pleural effusion. Upper Abdomen: Several small right renal cysts are stable. Otherwise unremarkable. Musculoskeletal: Lytic bone lesions are seen involving the T9 vertebra and right posterior 10th rib, and bilateral 5th ribs, which are new since previous study. Several other nondisplaced left rib fractures are also noted which may be pathologic. These are consistent with lytic bone metastases. IMPRESSION: Stable masslike opacity in central left upper lobe. Sub-cm bilateral pulmonary nodules and dominant ill-defined nodular opacity in right lower lobe also remain stable. New lytic bone metastases involving the thoracic spine and ribs, as described above. Aortic Atherosclerosis (ICD10-I70.0). Electronically Signed   By: Marlaine Hind M.D.   On: 09/30/2021 10:18   MR LUMBAR SPINE WO CONTRAST  Result Date: 09/30/2021 CLINICAL DATA:  Lumbar radiculopathy M54.16 (ICD-10-CM). EXAM: MRI LUMBAR SPINE WITHOUT CONTRAST TECHNIQUE: Multiplanar, multisequence MR imaging of the lumbar spine was performed. No intravenous contrast was administered. COMPARISON:  MRI of the lumbar spine September 01, 2017. FINDINGS: Segmentation:  Standard. Alignment:  Grade 1 anterolisthesis of L4 over L5,  unchanged. Vertebrae: Scattered T1 hypointense lesions within the visualized spine concerning for metastatic disease. Pathologic fracture of the superior endplate of W58 with minimal height loss and no retropulsion. The largest lesion is seen involving the sacrum at S1-2 on the right, expanding the cortex posteriorly resulting in impingement of the right S1 nerve root. Conus medullaris and cauda equina: Conus extends to the L1 level. Conus and cauda equina appear normal. Paraspinal and other soft tissues: Bilateral renal cysts, some of which appear hemorrhagic. Disc levels: T12-L1: No spinal canal or neural foraminal stenosis. L1-2: No spinal canal or neural foraminal stenosis. L2-3: Shallow disc bulge with tiny right foraminal/far lateral disc protrusion and mild facet degenerative changes resulting in mild right neural foraminal narrowing. No spinal canal stenosis. L3-4: Shallow disc bulge and mild facet degenerative changes resulting in mild bilateral neural foraminal narrowing. No significant spinal canal stenosis. L4-5: Disc bulge/disc uncovering and hypertrophic facet degenerative changes with ligamentum flavum redundancy resulting in mild spinal canal stenosis with narrowing of the bilateral subarticular zones and mild to moderate bilateral neural foraminal narrowing, progressed since prior MRI. L5-S1: Shallow disc bulge and mild facet degenerative changes without significant spinal canal or neural foraminal stenosis. S1-2: Partially visualized on the axial views. Expansile lesion on the right side at S1-2 causing posterior bulging of the cortex resulting in impingement of the right S1 nerve root. IMPRESSION: 1. Scattered T1 hypointense lesions within the visualized spine concerning for metastatic disease. 2. The largest lesion at S1-2 expands the cortex posteriorly on the right side, impinging on the right S1 nerve root. 3. Pathologic fracture of the superior endplate of K99 without significant height loss  and no retropulsion. These results will be called to the ordering clinician or representative by the Radiologist Assistant, and communication documented in the PACS or [employer]. Electronically Signed   By: Pedro Earls M.D.   On: 09/30/2021 10:33    Impression: Stage IA (T1c, N0, M0) non-small cell lung cancer of the left upper lobe, adenocarcinoma  Recent chest CT scan shows stability of her lesion in the left upper lobe consistent with response to her SBRT treatments.  Unfortunately the patient over a 72-monthperiod has developed diffuse osseous metastasis.  She is quite debilitated with pain concerning these issues.  In questioning her today the most significant area of pain is in the lumbosacral area.  I would recommend palliative radiation therapy to this area given the MRI findings and her severity of pain as well as neurologic symptoms.  I also discussed consideration for starting a long-acting narcotic which she is agreeable to also consider steroids.  She would like to hold off on steroids at this time but will reconsider at a later date.  I discussed the course of treatment side effects and potential long-term effects of lumbosacral radiation therapy with the patient.  She appears to understand and wishes to proceed with planned course of treatment.  Plan: Patient will return on February 13 for CT simulation with treatment to begin February 15.  Anticipate 10-14 treatments directed at the lumbosacral spine. also refer the patient to medical oncology.  She has seen Dr. Julien Nordmann in the past and we will get her set up to see him as soon as possible.  We will also order PET scan for further evaluation.  She will likely need a biopsy to help Dr. Julien Nordmann guide his therapy.  She also may need an MRI of the brain.   35 minutes of total time was spent for this patient encounter, including preparation, face-to-face counseling with the patient and coordination of care, physical  exam, and documentation of the encounter. ____________________________________  Blair Promise, PhD, MD   This document serves as a record of services personally performed by Gery Pray, MD. It was created on his behalf by Roney Mans, a trained medical scribe. The creation of this record is based on the scribe's personal observations and the provider's statements to them. This document has been checked and approved by the attending provider.

## 2021-10-02 ENCOUNTER — Other Ambulatory Visit: Payer: Self-pay | Admitting: Radiation Oncology

## 2021-10-02 ENCOUNTER — Other Ambulatory Visit (HOSPITAL_COMMUNITY): Payer: Self-pay

## 2021-10-02 ENCOUNTER — Ambulatory Visit
Admission: RE | Admit: 2021-10-02 | Discharge: 2021-10-02 | Disposition: A | Discharge status: Home / Self Care | Payer: Medicare Other | Source: Ambulatory Visit | Attending: Radiation Oncology | Admitting: Radiation Oncology

## 2021-10-02 ENCOUNTER — Encounter: Payer: Self-pay | Admitting: Radiation Oncology

## 2021-10-02 ENCOUNTER — Other Ambulatory Visit: Payer: Self-pay

## 2021-10-02 VITALS — BP 124/89 | HR 83 | Temp 97.9°F | Resp 18

## 2021-10-02 DIAGNOSIS — I700 Atherosclerosis of aorta: Secondary | ICD-10-CM | POA: Insufficient documentation

## 2021-10-02 DIAGNOSIS — C7951 Secondary malignant neoplasm of bone: Secondary | ICD-10-CM | POA: Diagnosis not present

## 2021-10-02 DIAGNOSIS — Z791 Long term (current) use of non-steroidal anti-inflammatories (NSAID): Secondary | ICD-10-CM | POA: Insufficient documentation

## 2021-10-02 DIAGNOSIS — Z7901 Long term (current) use of anticoagulants: Secondary | ICD-10-CM | POA: Diagnosis not present

## 2021-10-02 DIAGNOSIS — C3412 Malignant neoplasm of upper lobe, left bronchus or lung: Secondary | ICD-10-CM | POA: Insufficient documentation

## 2021-10-02 DIAGNOSIS — M4316 Spondylolisthesis, lumbar region: Secondary | ICD-10-CM | POA: Diagnosis not present

## 2021-10-02 DIAGNOSIS — K5900 Constipation, unspecified: Secondary | ICD-10-CM | POA: Diagnosis not present

## 2021-10-02 DIAGNOSIS — Z79899 Other long term (current) drug therapy: Secondary | ICD-10-CM | POA: Diagnosis not present

## 2021-10-02 DIAGNOSIS — C3492 Malignant neoplasm of unspecified part of left bronchus or lung: Secondary | ICD-10-CM

## 2021-10-02 DIAGNOSIS — Z08 Encounter for follow-up examination after completed treatment for malignant neoplasm: Secondary | ICD-10-CM | POA: Diagnosis not present

## 2021-10-02 DIAGNOSIS — R339 Retention of urine, unspecified: Secondary | ICD-10-CM | POA: Insufficient documentation

## 2021-10-02 MED ORDER — MORPHINE SULFATE ER 15 MG PO TBCR
15.0000 mg | EXTENDED_RELEASE_TABLET | Freq: Two times a day (BID) | ORAL | 0 refills | Status: DC
Start: 2021-10-02 — End: 2021-10-15
  Filled 2021-10-02: qty 30, 15d supply, fill #0

## 2021-10-02 MED ORDER — DEXAMETHASONE 4 MG PO TABS
4.0000 mg | ORAL_TABLET | Freq: Two times a day (BID) | ORAL | 0 refills | Status: DC
  Filled 2021-10-02: qty 30, 15d supply, fill #0

## 2021-10-02 NOTE — Progress Notes (Signed)
Shannon [name] is here today for follow up post radiation to the lung.  Lung Side: left  Completed radiation treatment on: 04/22/2020  Does the patient complain of any of the following: Pain:8/10 back pain Shortness of breath w/wo exertion: shortness of breath at rest, uses home oxygen Cough: productive cough with light yellow phlegm Hemoptysis: denies Pain with swallowing: denies Swallowing/choking concerns: denies Appetite: fair Energy Level: low energy Post radiation skin Changes: denies    Additional comments if applicable: insomnia, right sided weakness, neuropathy in right fingers and toes, memory problems, hair loss  Vitals:   10/02/21 1119  BP: 124/89  Pulse: 83  Resp: 18  Temp: 97.9 F (36.6 C)  SpO2: 91%

## 2021-10-03 ENCOUNTER — Telehealth: Payer: Self-pay | Admitting: Internal Medicine

## 2021-10-03 ENCOUNTER — Telehealth: Payer: Self-pay | Admitting: *Deleted

## 2021-10-03 ENCOUNTER — Ambulatory Visit: Payer: Medicare Other | Admitting: Pulmonary Disease

## 2021-10-03 ENCOUNTER — Telehealth: Payer: Self-pay

## 2021-10-03 ENCOUNTER — Telehealth: Payer: Self-pay | Admitting: Pulmonary Disease

## 2021-10-03 ENCOUNTER — Other Ambulatory Visit (HOSPITAL_COMMUNITY): Payer: Self-pay

## 2021-10-03 ENCOUNTER — Encounter: Payer: Self-pay | Admitting: Pulmonary Disease

## 2021-10-03 ENCOUNTER — Telehealth (INDEPENDENT_AMBULATORY_CARE_PROVIDER_SITE_OTHER): Payer: Medicare Other | Admitting: Pulmonary Disease

## 2021-10-03 DIAGNOSIS — J449 Chronic obstructive pulmonary disease, unspecified: Secondary | ICD-10-CM | POA: Diagnosis not present

## 2021-10-03 DIAGNOSIS — J9611 Chronic respiratory failure with hypoxia: Secondary | ICD-10-CM

## 2021-10-03 DIAGNOSIS — C3492 Malignant neoplasm of unspecified part of left bronchus or lung: Secondary | ICD-10-CM | POA: Diagnosis not present

## 2021-10-03 NOTE — Patient Instructions (Signed)
Will see if [employer] can arrange for a portable oxygen concentrator  Follow up in 3 months

## 2021-10-03 NOTE — Telephone Encounter (Signed)
Called to make patient aware that Dr. Sondra Come sent over prescriptions for MS contin and Decadron to  [facility]. Patient voiced understanding.

## 2021-10-03 NOTE — Telephone Encounter (Signed)
Called and spoke with patient. She stated that she recently found out her cancer has returned but it is now in her spine. She has been suffering from back pain since August 2022 and recently had a MRI that confirmed this.   She wanted to convert her visit to a video visit this afternoon at 12pm. Spoke with Dr. Halford Chessman about this and he was ok with the switch.   Nothing further needed at time of call.

## 2021-10-03 NOTE — Telephone Encounter (Signed)
CALLED PATIENT TO INFORM OF PET SCAN FOR 10-09-21- ARRIVAL TIME- 12:30 PM @ [facility], PATIENT TO HAVE ONLY WATER- 6 HRS. PRIOR TO TEST, LVM FOR A RETURN CALL

## 2021-10-03 NOTE — Telephone Encounter (Signed)
Pt called today to cancel appt with VS at 12. She is in a lot of pain as she just found out her cancer is back yesterday. Offered to switch her appoitnment to MyChart video and did so. When I explained how it would work, she informed me that she only has her home phone and not a cell. Asking to do telephone visit instead. I did inform her that we dont typically offer phone visits anymore but she would like to know if we could make an exception given her circumstance. Please advise.

## 2021-10-03 NOTE — Telephone Encounter (Signed)
Scheduled appt per 2/9 referral. Pt is already established with Dr. Julien Nordmann. I scheduled pt for next available f/u slot with MD. Pt is aware of appt date and time.

## 2021-10-03 NOTE — Progress Notes (Signed)
[facility]  Chief Complaint  Patient presents with   Follow-up    Pt states she recently found out that her cancer has come back in her spine and due to this, she has been having problems with her asthma and has been wheezing and has had increased SOB.    Past Surgical History:  She  has a past surgical history that includes Cardiac catheterization; Tonsillectomy and adenoidectomy; Abdominal hysterectomy; Brain surgery (1974); Brain surgery; Video bronchoscopy with endobronchial navigation (N/A, 02/13/2020); Bronchial brushings (02/13/2020); Bronchial needle aspiration biopsy (02/13/2020); Bronchial biopsy (02/13/2020); and Fiducial marker placement (02/13/2020).  Past Medical History:  Stage 1a NSCLC (adenocarcinoma) June 2021 s/p SBRT, HIV, Lipodystrophy, A flutter, CKD 3a, Anxiety, OA, Chronic fatigue, Back pain, GERD, PE 2016, HLD, HTN, CAD, Multinodular thyroid, Osteopenia, Shingles, Vit B12 deficiency, Vit D deficiency  Constitutional:  There were no vitals taken for this visit.  Deferred  Brief Summary:  Shannon [name] is a 66 y.o. female with chronic obstructive allergic asthma and chronic respiratory failure with hypoxia.      Subjective:  Virtual Visit via Video Note  I connected with Shannon [name] on 10/03/21 at 12:00 PM EST by a video enabled telemedicine application and verified that I am speaking with the correct person using two identifiers.  Location: Patient: home Provider: medical office   I discussed the limitations of evaluation and management by telemedicine and the availability of in person appointments. The patient expressed understanding and agreed to proceed.  History of Present Illness:  She has been complaining of back pain for several months.  Saw ortho and neurosurgery.  Imaging now shows metastatic disease to spine and other bones.  She had script for decadron sent in, and will be starting XRT next week.   She is getting more short of breath due to pain symptoms and using oxygen 24/7 now.  She has trouble carrying her oxygen tanks.  Never heard from [employer] about getting a POC.  Physical Exam:  Deferred.   Pulmonary testing:  PFT 12/04/15 >> FEV1 0.80 (39%), FEV1% 55, TLC 3.58 (70%), DLCO 66%, +BD A1AT 03/27/16 >> 133, MM Allergy testing 12/27/17 >> white alder, oak, cottonwood, maple  Chest Imaging:  CT chest 04/27/21 >> 2 mm RUL nodule stable, 2.1 x 1.8 cm cystic and solid nodule RLL no change, perihilar volume loss and fibrosis LUL CT chest 09/30/21 >> 4.8 x 2 cm mass in LUL, 1.7 x 1.7 cm nodule RLL; lytic bone lesions in T9 vertebra, Rt 10 rib, b/l 5 th ribs, non displaced Lt rib fractures  Sleep Tests:  PSG 05/11/16 >> AHI 2.5, SpO2 low 84%; needed 1 liter O2  Cardiac Tests:  Echo 11/15/19 >> EF 60 to 65%, mild LVH, grade 1 DD, RVSP 25.1 mmHg  Social History:  She  reports that she has never smoked. She has never used smokeless tobacco. She reports that she does not currently use alcohol. She reports that she does not use drugs.  Family History:  Her family history includes Asthma in her mother; Diabetes in her sister; Heart disease in her mother; Heart failure in her mother; Heart murmur in her brother, sister, and sister; Hypertension in her mother; Obesity in her mother; Thyroid disease in her mother and sister.     Assessment/Plan:   COPD from allergic asthma. - continue breo 100 one puff daily - prn albuterol  Chronic respiratory failure with hypoxia from COPD and obesity hypoventilation syndrome. -  2 liters oxygen 24/7 - never heard from [employer] about POC - will see if [employer] can get a POC set up for her  Chronic diastolic CHF, Atrial flutter. - followed by Dr. Skeet Latch with [facility]  HIV. - followed by Dr. [name] with Infectious Disease  Stage 4 lung cancer. - has metastatic lesions in thoracic spine, sacral region, and bilateral ribs - she has  PET scan scheduled - follow up with Dr. Julien Nordmann and Dr. Sondra Come  Time Spent Involved in Patient Care on Day of Examination:   I discussed the assessment and treatment plan with the patient. The patient was provided an opportunity to ask questions and all were answered. The patient agreed with the plan and demonstrated an understanding of the instructions.   The patient was advised to call back or seek an in-person evaluation if the symptoms worsen or if the condition fails to improve as anticipated.  I provided 37 minutes of non-face-to-face time during this encounter.  Follow up:   Patient Instructions  Will see if [employer] can arrange for a portable oxygen concentrator  Follow up in 3 months  Medication List:   Allergies as of 10/03/2021       Reactions   Tree Extract Swelling, Other (See Comments)   Swelling to eyes   Augmentin [amoxicillin-pot Clavulanate] Other (See Comments)   Headache, dizzy   Lisinopril Cough   Face/throat swelling   Ciprofloxacin Hives        Medication List        Accurate as of October 03, 2021 12:26 PM. If you have any questions, ask your nurse or doctor.          STOP taking these medications    diclofenac Sodium 1 % Gel Commonly known as: Voltaren Stopped by: Chesley Mires, MD   HYDROcodone-acetaminophen 5-325 MG tablet Commonly known as: NORCO/VICODIN Stopped by: Chesley Mires, MD   pregabalin 25 MG capsule Commonly known as: Lyrica Stopped by: Chesley Mires, MD       TAKE these medications    acetaminophen 500 MG tablet Commonly known as: TYLENOL Take 2 tablets (1,000 mg total) by mouth every 6 (six) hours as needed.   albuterol 108 (90 Base) MCG/ACT inhaler Commonly known as: VENTOLIN HFA Inhale into the lungs every 6 (six) hours as needed for wheezing or shortness of breath.   albuterol (2.5 MG/3ML) 0.083% nebulizer solution Commonly known as: PROVENTIL Take 3 mLs (2.5 mg total) by nebulization every 4 (four) hours as  needed for wheezing or shortness of breath.   b complex vitamins tablet Take 1 tablet by mouth daily.   B-D SYRINGE/NEEDLE 1CC/25GX5/8 25G X 5/8" 1 ML Misc Generic drug: SYRINGE/NEEDLE (DISP) 1 ML 1 Units by Does not apply route every 30 (thirty) days.   Biktarvy 50-200-25 MG Tabs tablet Generic drug: bictegravir-emtricitabine-tenofovir AF TAKE 1 TABLET BY MOUTH DAILY   Biotene Dry Mouth Gentle Liqd Use as directed 1 Dose in the mouth or throat 2 (two) times daily as needed (dry mouth).   BIOTIN PO Take 1 tablet by mouth daily as needed (for supplementation).   calcium carbonate 1500 (600 Ca) MG Tabs tablet Commonly known as: OSCAL Take 600 mg of elemental calcium by mouth daily with breakfast.   cyanocobalamin 1000 MCG/ML injection Commonly known as: (VITAMIN B-12) ADMINISTER 1 ML(1000 MCG) IN THE MUSCLE EVERY 30 DAYS   cyclobenzaprine 5 MG tablet Commonly known as: FLEXERIL Take 0.5-1 tablets (2.5-5 mg total) by mouth as  needed for muscle spasms.   dexamethasone 4 MG tablet Commonly known as: DECADRON Take 1 tablet (4 mg total) by mouth 2 (two) times daily with a meal.   diltiazem 240 MG 24 hr capsule Commonly known as: CARDIZEM CD TAKE 1 CAPSULE(240 MG) BY MOUTH DAILY   famotidine 40 MG tablet Commonly known as: PEPCID Take 1 tablet (40 mg total) by mouth daily.   Fish Oil 1000 MG Caps Take 1,000 mg by mouth daily as needed (pt prefrence).   fluticasone furoate-vilanterol 100-25 MCG/ACT Aepb Commonly known as: Breo Ellipta Inhale 1 puff into the lungs daily.   furosemide 20 MG tablet Commonly known as: LASIX TAKE 1 TABLET(20 MG) BY MOUTH DAILY AS NEEDED   MAGNESIUM PO Take 1 tablet by mouth every other day.   melatonin 5 MG Tabs Take 5 mg by mouth at bedtime as needed (for sleep).   morphine 15 MG 12 hr tablet Commonly known as: MS CONTIN Take 1 tablet by mouth every 12 hours.   Oxycodone HCl 10 MG Tabs Take 10 mg by mouth 4 (four) times daily as  needed.   OXYGEN Inhale 1.5-2 L into the lungs See admin instructions. Uses at bedtime and through the day as needed   Pulse Oximeter For Finger Misc 1 Units by Does not apply route as needed.   SM Vitamin D3 100 MCG (4000 UT) Caps Generic drug: Cholecalciferol Take 1 capsule (4,000 Units total) by mouth daily.   spironolactone 25 MG tablet Commonly known as: ALDACTONE Take 1 tablet (25 mg total) by mouth daily.   vitamin C 1000 MG tablet Take 1,000 mg by mouth every other day.   Xarelto 20 MG Tabs tablet Generic drug: rivaroxaban TAKE 1 TABLET(20 MG) BY MOUTH DAILY   zinc gluconate 50 MG tablet Take 50 mg by mouth daily.        Signature:  Chesley Mires, MD [place] Pager - [phone] 10/03/2021, 12:26 PM

## 2021-10-06 ENCOUNTER — Other Ambulatory Visit: Payer: Self-pay

## 2021-10-06 ENCOUNTER — Ambulatory Visit
Admission: RE | Admit: 2021-10-06 | Discharge: 2021-10-06 | Disposition: A | Discharge status: Home / Self Care | Payer: Medicare Other | Source: Ambulatory Visit | Attending: Radiation Oncology | Admitting: Radiation Oncology

## 2021-10-06 ENCOUNTER — Ambulatory Visit: Payer: Medicare Other | Admitting: Radiation Oncology

## 2021-10-06 DIAGNOSIS — C3492 Malignant neoplasm of unspecified part of left bronchus or lung: Secondary | ICD-10-CM

## 2021-10-06 DIAGNOSIS — C7951 Secondary malignant neoplasm of bone: Secondary | ICD-10-CM | POA: Diagnosis not present

## 2021-10-06 DIAGNOSIS — Z510 Encounter for antineoplastic radiation therapy: Secondary | ICD-10-CM | POA: Diagnosis not present

## 2021-10-06 DIAGNOSIS — J449 Chronic obstructive pulmonary disease, unspecified: Secondary | ICD-10-CM | POA: Diagnosis not present

## 2021-10-06 DIAGNOSIS — C3412 Malignant neoplasm of upper lobe, left bronchus or lung: Secondary | ICD-10-CM | POA: Diagnosis not present

## 2021-10-07 DIAGNOSIS — C7951 Secondary malignant neoplasm of bone: Secondary | ICD-10-CM | POA: Diagnosis not present

## 2021-10-07 DIAGNOSIS — C3412 Malignant neoplasm of upper lobe, left bronchus or lung: Secondary | ICD-10-CM | POA: Diagnosis not present

## 2021-10-07 DIAGNOSIS — Z510 Encounter for antineoplastic radiation therapy: Secondary | ICD-10-CM | POA: Diagnosis not present

## 2021-10-08 ENCOUNTER — Other Ambulatory Visit: Payer: Self-pay

## 2021-10-08 ENCOUNTER — Ambulatory Visit
Admission: RE | Admit: 2021-10-08 | Discharge: 2021-10-08 | Disposition: A | Discharge status: Home / Self Care | Payer: Medicare Other | Source: Ambulatory Visit | Attending: Radiation Oncology | Admitting: Radiation Oncology

## 2021-10-08 DIAGNOSIS — C7951 Secondary malignant neoplasm of bone: Secondary | ICD-10-CM

## 2021-10-08 DIAGNOSIS — C3412 Malignant neoplasm of upper lobe, left bronchus or lung: Secondary | ICD-10-CM | POA: Diagnosis not present

## 2021-10-08 DIAGNOSIS — Z510 Encounter for antineoplastic radiation therapy: Secondary | ICD-10-CM | POA: Diagnosis not present

## 2021-10-09 ENCOUNTER — Ambulatory Visit
Admission: RE | Admit: 2021-10-09 | Discharge: 2021-10-09 | Disposition: A | Discharge status: Home / Self Care | Payer: Medicare Other | Source: Ambulatory Visit | Attending: Radiation Oncology | Admitting: Radiation Oncology

## 2021-10-09 ENCOUNTER — Ambulatory Visit (HOSPITAL_COMMUNITY): Payer: Medicare Other

## 2021-10-09 ENCOUNTER — Encounter (HOSPITAL_COMMUNITY): Payer: Self-pay

## 2021-10-09 DIAGNOSIS — C7951 Secondary malignant neoplasm of bone: Secondary | ICD-10-CM | POA: Diagnosis not present

## 2021-10-09 DIAGNOSIS — Z510 Encounter for antineoplastic radiation therapy: Secondary | ICD-10-CM | POA: Diagnosis not present

## 2021-10-09 DIAGNOSIS — C3412 Malignant neoplasm of upper lobe, left bronchus or lung: Secondary | ICD-10-CM | POA: Diagnosis not present

## 2021-10-10 ENCOUNTER — Other Ambulatory Visit: Payer: Self-pay

## 2021-10-10 ENCOUNTER — Ambulatory Visit
Admission: RE | Admit: 2021-10-10 | Discharge: 2021-10-10 | Disposition: A | Discharge status: Home / Self Care | Payer: Medicare Other | Source: Ambulatory Visit | Attending: Radiation Oncology | Admitting: Radiation Oncology

## 2021-10-10 DIAGNOSIS — C7951 Secondary malignant neoplasm of bone: Secondary | ICD-10-CM | POA: Diagnosis not present

## 2021-10-10 DIAGNOSIS — Z510 Encounter for antineoplastic radiation therapy: Secondary | ICD-10-CM | POA: Diagnosis not present

## 2021-10-10 DIAGNOSIS — C3412 Malignant neoplasm of upper lobe, left bronchus or lung: Secondary | ICD-10-CM | POA: Diagnosis not present

## 2021-10-13 ENCOUNTER — Other Ambulatory Visit: Payer: Self-pay

## 2021-10-13 ENCOUNTER — Ambulatory Visit (HOSPITAL_BASED_OUTPATIENT_CLINIC_OR_DEPARTMENT_OTHER): Payer: Medicare Other | Attending: Orthopedic Surgery | Admitting: Physical Therapy

## 2021-10-13 ENCOUNTER — Encounter (HOSPITAL_BASED_OUTPATIENT_CLINIC_OR_DEPARTMENT_OTHER): Payer: Self-pay | Admitting: Physical Therapy

## 2021-10-13 ENCOUNTER — Ambulatory Visit
Admission: RE | Admit: 2021-10-13 | Discharge: 2021-10-13 | Disposition: A | Discharge status: Home / Self Care | Payer: Medicare Other | Source: Ambulatory Visit | Attending: Radiation Oncology | Admitting: Radiation Oncology

## 2021-10-13 DIAGNOSIS — Z510 Encounter for antineoplastic radiation therapy: Secondary | ICD-10-CM | POA: Diagnosis not present

## 2021-10-13 DIAGNOSIS — C7951 Secondary malignant neoplasm of bone: Secondary | ICD-10-CM | POA: Diagnosis not present

## 2021-10-13 DIAGNOSIS — M5441 Lumbago with sciatica, right side: Secondary | ICD-10-CM | POA: Insufficient documentation

## 2021-10-13 DIAGNOSIS — C3412 Malignant neoplasm of upper lobe, left bronchus or lung: Secondary | ICD-10-CM | POA: Diagnosis not present

## 2021-10-13 DIAGNOSIS — G8929 Other chronic pain: Secondary | ICD-10-CM | POA: Insufficient documentation

## 2021-10-13 NOTE — Therapy (Signed)
[facility] [address] [place], [place], [address] Phone: [phone]   Fax:  [phone]  Physical Therapy Evaluation  Patient Details  Name: Shannon [name] MRN: [id number] Date of Birth: 01-12-1956 No data recorded  Encounter Date: 10/13/2021   PT End of Session - 10/13/21 1107     Visit Number 1    Number of Visits 1    Date for PT Re-Evaluation 10/13/21    PT Start Time 9449    PT Stop Time 1055    PT Time Calculation (min) 40 min    Activity Tolerance Patient tolerated treatment well    Behavior During Therapy WFL for tasks assessed/performed             Past Medical History:  Diagnosis Date   Anemia    Anxiety    HX PANIC ATTACKS   Arthritis    "starting to; in my hands" (07/09/2015)   Asthma    Atrial fibrillation (HCC)    Atrial flutter, paroxysmal ([place])    Bloated abdomen    CFS (chronic fatigue syndrome)    Chewing difficulty    Chronic asthma with acute exacerbation    "I have chronic asthma all the time; sometimes exacerbations" (07/09/2015)   Chronic lower back pain    COPD (chronic obstructive pulmonary disease) ([place])    Cyst of right kidney    "3 of them; dx'd in ~ 01/2015"   Dyspnea    GERD (gastroesophageal reflux disease)    Heart murmur    History of blood transfusion    "related to my brain surgery I think"   History of pulmonary embolism 07/09/2015   History of radiation therapy 04/09/20-04/22/20   SBRT Left Lung, Dr. Gery Pray   HIV antibody positive ([place])    HIV disease ([place])    Hyperlipidemia    Hypertension    Leg edema    Lipodystrophy    Mild CAD 2013   Multiple thyroid nodules    Osteopenia    Palpitations    Pneumonia 07/09/2015   Shingles    Vitamin B 12 deficiency    Vitamin D deficiency     Past Surgical History:  Procedure Laterality Date   ABDOMINAL HYSTERECTOMY     "robotic laparosopic"   [place]   "brain tumor; benign; on top of my  brain; got a plate in there"   BRAIN SURGERY     age 32- -"Tumor pushing my skullout"   BRONCHIAL BIOPSY  02/13/2020   Procedure: BRONCHIAL BIOPSIES;  Surgeon: [name], MD;  Location: [facility];  Service: Pulmonary;;   BRONCHIAL BRUSHINGS  02/13/2020   Procedure: BRONCHIAL BRUSHINGS;  Surgeon: [name], MD;  Location[facility] END[facility]COPY;  Service: Pulmonary;;   BRONCHIAL NEEDLE ASPIRATION BIOPSY  02/13/2020   Procedure: BRONCHIAL NEEDLE ASPIRATION BIOPSIES;  Surgeon: [name], MD;  Location: [facility] ENDOSCOPY;  Service: Pulmonary;;   CARDIAC CATHETERIZATION     FIDUCIAL MARKER PLACEMENT  02/13/2020   Procedure: FIDUCIAL MARKER PLACEMENT;  Surgeon: [name], MD;  Location: [facility] ENDOSCOPY;  Service: Pulmonary;;   TONSILLECTOMY AND ADENOIDECTOMY     VIDEO BRONCHOSCOPY WITH ENDOBRONCHIAL NAVIGATION N/A 02/13/2020   Procedure: VIDEO BRONCHOSCOPY WITH ENDOBRONCHIAL NAVIGATION;  Surgeon: [name], MD;  Location: [facility] ENDOSCOPY;  Service: Pulmonary;  Laterality: N/A;    There were no vitals filed for this visit.     Subjective: see clinical impression statement  Objective measurements completed on examination: See above findings.                             Plan - 10/13/21 1110     Clinical Impression Statement The patient cones in today with her MRI showing bone mets to her sacrum and a pathological fx of her T-12 area. She reports her cancer MD is not aware that she is getting rehab. We will hold for clearance from her MD. She has increased pain standing and walking. She has pain straightening her right leg. We will do a full assessment when she is cleared.             Patient will benefit from skilled therapeutic intervention in order to improve the following deficits and impairments:     Visit Diagnosis: Chronic right-sided low back pain with right-sided sciatica     Problem List Patient Active Problem  List   Diagnosis Date Noted   Osseous metastasis ([place]) 10/06/2021   Chest wall pain 08/19/2021   Acute right-sided thoracic back pain 08/12/2021   Intercostal muscle strain 04/14/2021   Rib fracture 03/12/2021   Osteoporosis 03/12/2021   Panniculitis 01/23/2021   Healthcare maintenance 01/23/2021   On supplemental oxygen therapy 11/19/2020   Insomnia 11/14/2020   Burn erythema of breast, initial encounter 11/05/2020   Skin ulcer ([place]) 06/24/2020   Pulmonary nodule 02/14/2020   Nodule of upper lobe of left lung 02/06/2020   Thyroid nodule 01/26/2020   Adenocarcinoma of lung ([place]) 01/04/2020   Chronic obstructive asthma ([place]) 12/29/2019   (HFpEF) heart failure with preserved ejection fraction ([place]) 02/07/2019   Vitamin D deficiency 06/30/2018   Chronic anticoagulation 01/03/2018   Chronic respiratory failure with hypoxia ([place]) 11/30/2017   Atrial flutter ([place]) 03/11/2017   CAD (coronary artery disease) 01/28/2017   Obesity (BMI 30-39.9) 01/13/2017   Ascending aorta dilatation ([place]) 01/07/2017   Low back pain radiating to right lower extremity 12/02/2016   Acquired cyst of kidney 12/02/2016   Chronic kidney disease (CKD), stage III (moderate) (HCC) 12/01/2016   Generalized anxiety disorder 10/22/2016   Hypersomnia 10/01/2016   Accessory skin tags 06/10/2016   Nocturnal hypoxemia 05/13/2016   Cervical radiculopathy 04/14/2016   GERD (gastroesophageal reflux disease) 01/02/2016   Morbid (severe) obesity due to excess calories ([place]) 10/10/2015   Vitamin B12 deficiency 10/02/2015   Severe persistent asthma with (acute) exacerbation 07/09/2015   HIV disease ([place]) 07/09/2015   Uncontrolled hypertension 07/09/2015    Carney Living, PT 10/13/2021, 11:18 AM  [facility] [address] [place], [place], [address] Phone: [phone]   Fax:  [phone]  Name: SHIZUKO WOJDYLA MRN: [id number] Date of Birth: 06/05/1956

## 2021-10-14 ENCOUNTER — Inpatient Hospital Stay: Payer: Medicare Other | Attending: Internal Medicine | Admitting: Internal Medicine

## 2021-10-14 ENCOUNTER — Ambulatory Visit: Payer: Self-pay

## 2021-10-14 ENCOUNTER — Ambulatory Visit: Payer: Medicare Other

## 2021-10-14 ENCOUNTER — Other Ambulatory Visit: Payer: Self-pay

## 2021-10-14 DIAGNOSIS — E538 Deficiency of other specified B group vitamins: Secondary | ICD-10-CM

## 2021-10-14 MED ORDER — CYANOCOBALAMIN 1000 MCG/ML IJ SOLN: INTRAMUSCULAR | 1 refills | Status: DC

## 2021-10-14 NOTE — Chronic Care Management (AMB) (Signed)
° °  10/14/2021  Shannon [name] Mar 07, 1956 [id number]  Received message from [facility] staff that patient called requesting a return call.  Successful outreach to patient who is requesting a hospital bed.  Patient explained that the back pain she has been having is related to newly diagnosed spinal cancer.  Patient has started radiation treatments and she is in a lot of pain.  She lives in a condo which has 2 floors and she can no longer get to the 2nd floor to go to bed.  She says she will set the hospital bed up on the first floor.  Patient has appointment on Thursday for a PET scan and to see oncology physician.  Explained to patient that this RNCM would have to obtain order for the hospital bed and I will contact Dr. Daryll Drown.  Patient requested a call from Dr. Daryll Drown to discuss her diagnosis and message sent requesting order and call from her PCP.   Plan; Follow up with [facility] for hospital bed after order received. [name], RN, BSN, CCM Care Management Coordinator [facility] Internal Medicine Phone: [phone]: [phone]

## 2021-10-14 NOTE — Telephone Encounter (Signed)
Cardizem was refilled 06/2021 qty# 90 x 3 RF's.

## 2021-10-14 NOTE — Telephone Encounter (Signed)
cyanocobalamin (,VITAMIN B-12,) 1000 MCG/ML injection,  diltiazem (CARDIZEM CD) 240 MG 24 hr capsule, refill request @ [id number] - [place], [place] AT [address].

## 2021-10-15 ENCOUNTER — Other Ambulatory Visit: Payer: Self-pay | Admitting: Radiation Oncology

## 2021-10-15 ENCOUNTER — Ambulatory Visit
Admission: RE | Admit: 2021-10-15 | Discharge: 2021-10-15 | Disposition: A | Discharge status: Home / Self Care | Payer: Medicare Other | Source: Ambulatory Visit | Attending: Radiation Oncology | Admitting: Radiation Oncology

## 2021-10-15 ENCOUNTER — Other Ambulatory Visit: Payer: Self-pay

## 2021-10-15 ENCOUNTER — Telehealth: Payer: Self-pay

## 2021-10-15 DIAGNOSIS — C3412 Malignant neoplasm of upper lobe, left bronchus or lung: Secondary | ICD-10-CM | POA: Diagnosis not present

## 2021-10-15 DIAGNOSIS — Z510 Encounter for antineoplastic radiation therapy: Secondary | ICD-10-CM | POA: Diagnosis not present

## 2021-10-15 DIAGNOSIS — C7951 Secondary malignant neoplasm of bone: Secondary | ICD-10-CM | POA: Diagnosis not present

## 2021-10-15 DIAGNOSIS — C3492 Malignant neoplasm of unspecified part of left bronchus or lung: Secondary | ICD-10-CM

## 2021-10-15 MED ORDER — SONAFINE EX EMUL
1.0000 "application " | Freq: Once | CUTANEOUS | Status: AC
  Administered 2021-10-15: 1 via TOPICAL

## 2021-10-15 MED ORDER — DEXAMETHASONE 4 MG PO TABS: 4.0000 mg | ORAL_TABLET | Freq: Two times a day (BID) | ORAL | 0 refills | Status: DC

## 2021-10-15 MED ORDER — ONDANSETRON HCL 4 MG PO TABS: 4.0000 mg | ORAL_TABLET | Freq: Three times a day (TID) | ORAL | 0 refills | Status: DC | PRN

## 2021-10-15 MED ORDER — MORPHINE SULFATE ER 15 MG PO TBCR: 15.0000 mg | EXTENDED_RELEASE_TABLET | Freq: Two times a day (BID) | ORAL | 0 refills | Status: DC

## 2021-10-15 NOTE — Telephone Encounter (Signed)
°  Chronic Care Management   Note  10/15/2021 Name: Shannon [name] MRN: [id number] DOB: 02-Apr-1956  Message received from staff to return patient call this morning.  Attempted to reach patient and received voicemail.  Message left on patient identified voicemail with return call information.  [name], RN, BSN, CCM Care Management Coordinator [facility] Internal Medicine Phone: [phone]: [phone]

## 2021-10-16 ENCOUNTER — Ambulatory Visit
Admission: RE | Admit: 2021-10-16 | Discharge: 2021-10-16 | Disposition: A | Discharge status: Home / Self Care | Payer: Medicare Other | Source: Ambulatory Visit | Attending: Radiation Oncology | Admitting: Radiation Oncology

## 2021-10-16 ENCOUNTER — Ambulatory Visit (HOSPITAL_COMMUNITY)
Admission: RE | Admit: 2021-10-16 | Discharge: 2021-10-16 | Disposition: A | Discharge status: Home / Self Care | Payer: Medicare Other | Source: Ambulatory Visit | Attending: Radiation Oncology | Admitting: Radiation Oncology

## 2021-10-16 ENCOUNTER — Telehealth: Payer: Self-pay | Admitting: Internal Medicine

## 2021-10-16 DIAGNOSIS — I77810 Thoracic aortic ectasia: Secondary | ICD-10-CM | POA: Diagnosis not present

## 2021-10-16 DIAGNOSIS — C7951 Secondary malignant neoplasm of bone: Secondary | ICD-10-CM | POA: Diagnosis not present

## 2021-10-16 DIAGNOSIS — I2510 Atherosclerotic heart disease of native coronary artery without angina pectoris: Secondary | ICD-10-CM | POA: Diagnosis not present

## 2021-10-16 DIAGNOSIS — C3412 Malignant neoplasm of upper lobe, left bronchus or lung: Secondary | ICD-10-CM | POA: Diagnosis not present

## 2021-10-16 DIAGNOSIS — C3492 Malignant neoplasm of unspecified part of left bronchus or lung: Secondary | ICD-10-CM | POA: Diagnosis not present

## 2021-10-16 DIAGNOSIS — K5730 Diverticulosis of large intestine without perforation or abscess without bleeding: Secondary | ICD-10-CM | POA: Diagnosis not present

## 2021-10-16 LAB — GLUCOSE, CAPILLARY: Glucose-Capillary: 71 mg/dL (ref 70–99)

## 2021-10-16 MED ORDER — FLUDEOXYGLUCOSE F - 18 (FDG) INJECTION
11.0900 | Freq: Once | INTRAVENOUS | Status: AC
  Administered 2021-10-16: 11.09 via INTRAVENOUS

## 2021-10-16 NOTE — Telephone Encounter (Signed)
Attempted to call patient.  No answer.  Left hipaa compliant voice message asking Shannon [name] to call the clinic back.   I will be in office until 4pm if she calls back, please let me know and I can speak with her.   Thank you!

## 2021-10-17 ENCOUNTER — Ambulatory Visit
Admission: RE | Admit: 2021-10-17 | Discharge: 2021-10-17 | Disposition: A | Discharge status: Home / Self Care | Payer: Medicare Other | Source: Ambulatory Visit | Attending: Radiation Oncology | Admitting: Radiation Oncology

## 2021-10-17 ENCOUNTER — Ambulatory Visit: Payer: Self-pay

## 2021-10-17 ENCOUNTER — Other Ambulatory Visit: Payer: Self-pay

## 2021-10-17 ENCOUNTER — Telehealth: Payer: Self-pay | Admitting: Internal Medicine

## 2021-10-17 DIAGNOSIS — Z510 Encounter for antineoplastic radiation therapy: Secondary | ICD-10-CM | POA: Diagnosis not present

## 2021-10-17 DIAGNOSIS — C3412 Malignant neoplasm of upper lobe, left bronchus or lung: Secondary | ICD-10-CM | POA: Diagnosis not present

## 2021-10-17 DIAGNOSIS — M898X9 Other specified disorders of bone, unspecified site: Secondary | ICD-10-CM

## 2021-10-17 DIAGNOSIS — C7951 Secondary malignant neoplasm of bone: Secondary | ICD-10-CM | POA: Diagnosis not present

## 2021-10-17 DIAGNOSIS — C763 Malignant neoplasm of pelvis: Secondary | ICD-10-CM

## 2021-10-17 DIAGNOSIS — M545 Low back pain, unspecified: Secondary | ICD-10-CM

## 2021-10-17 NOTE — Chronic Care Management (AMB) (Signed)
° °  10/17/2021  Shannon [name] Dec 23, 1955 [id number]  Received order for hospital bed from Dr. Daryll Drown.  Communicated with Stephannie Peters at [facility] and let her know that patient had an order for a hospital bed and she needs to have the best mattress available as she has spinal cancer and is in significant pain.  [name] said she would contact the patient. Call to patient at 11:04 and notified her that she will be receiving a call from [facility] to process and deliver a hospital bed. [name], RN, BSN, CCM Care Management Coordinator [facility] Internal Medicine Phone: [phone]: [phone]

## 2021-10-17 NOTE — Telephone Encounter (Signed)
Called patient to discuss her radiation treatment.  Confirmed identity with name.   [name] has had a recent diagnosis of bone cancer, likely metastatic disease.  She had a PET/CT yesterday and read is pending.  She is having daily radiation therapy with Dr. Sondra Come and following with Dr. Earlie Server as well.  She will need a biopsy.  We spoke about how frustrating this was and how unsure of the cause all the physicians are.  She feels angry which I think would be normal in this situation.  I expressed empathy and support.   We discussed her function at home.  She lives in a townhome and her bedroom is on the 2nd floor.  She lives alone.  She would like to get a hospital bed to ensure she is able to sleep well, as she cannot get to the 2nd floor easily.  She has pain in her back and side limiting her movement.  She also has neuropathy on the right side causing some numbness.  All of this makes it nearly impossible to reach her bedroom.  She is sleeping upright in a chair.  She has pain medication from Dr. Sondra Come which is helping.  The radiation is also helping with the pain.   She is hopeful that once it is clear what the source is that treatment can continue to help.   I will place an order for a hospital bed due to diagnosis of cancer, pain, difficulty navigating the stairs.   Gilles Chiquito, MD

## 2021-10-20 ENCOUNTER — Telehealth: Payer: Self-pay | Admitting: Internal Medicine

## 2021-10-20 ENCOUNTER — Other Ambulatory Visit: Payer: Self-pay

## 2021-10-20 ENCOUNTER — Ambulatory Visit
Admission: RE | Admit: 2021-10-20 | Discharge: 2021-10-20 | Disposition: A | Discharge status: Home / Self Care | Payer: Medicare Other | Source: Ambulatory Visit | Attending: Radiation Oncology | Admitting: Radiation Oncology

## 2021-10-20 ENCOUNTER — Other Ambulatory Visit (HOSPITAL_COMMUNITY): Payer: Self-pay

## 2021-10-20 ENCOUNTER — Inpatient Hospital Stay: Payer: Medicare Other

## 2021-10-20 DIAGNOSIS — Z510 Encounter for antineoplastic radiation therapy: Secondary | ICD-10-CM | POA: Diagnosis not present

## 2021-10-20 DIAGNOSIS — C7951 Secondary malignant neoplasm of bone: Secondary | ICD-10-CM | POA: Diagnosis not present

## 2021-10-20 DIAGNOSIS — C3412 Malignant neoplasm of upper lobe, left bronchus or lung: Secondary | ICD-10-CM | POA: Diagnosis not present

## 2021-10-20 NOTE — Telephone Encounter (Signed)
R/s pt's missed appt with Dr. Julien Nordmann. Pt is aware of new appt date and time.

## 2021-10-21 ENCOUNTER — Ambulatory Visit
Admission: RE | Admit: 2021-10-21 | Discharge: 2021-10-21 | Disposition: A | Discharge status: Home / Self Care | Payer: Medicare Other | Source: Ambulatory Visit | Attending: Radiation Oncology | Admitting: Radiation Oncology

## 2021-10-21 ENCOUNTER — Ambulatory Visit: Payer: Self-pay

## 2021-10-21 DIAGNOSIS — C3412 Malignant neoplasm of upper lobe, left bronchus or lung: Secondary | ICD-10-CM | POA: Diagnosis not present

## 2021-10-21 DIAGNOSIS — J45909 Unspecified asthma, uncomplicated: Secondary | ICD-10-CM | POA: Diagnosis not present

## 2021-10-21 DIAGNOSIS — J449 Chronic obstructive pulmonary disease, unspecified: Secondary | ICD-10-CM | POA: Diagnosis not present

## 2021-10-21 DIAGNOSIS — Z510 Encounter for antineoplastic radiation therapy: Secondary | ICD-10-CM | POA: Diagnosis not present

## 2021-10-21 DIAGNOSIS — C7951 Secondary malignant neoplasm of bone: Secondary | ICD-10-CM | POA: Diagnosis not present

## 2021-10-21 DIAGNOSIS — R911 Solitary pulmonary nodule: Secondary | ICD-10-CM | POA: Diagnosis not present

## 2021-10-21 DIAGNOSIS — R269 Unspecified abnormalities of gait and mobility: Secondary | ICD-10-CM | POA: Diagnosis not present

## 2021-10-21 NOTE — Chronic Care Management (AMB) (Signed)
° °  10/21/2021  Shannon [name] Aug 22, 1956 [id number]  Successful outreach to patient this morning to verify she had been contacted by Shannon [name] at [facility] regarding her hospital bed.  Per Shannon [name], she has been speaking with Shannon [name] and they do have a hospital bed for her.  The difficulty has been with Shannon [name] having to go to radiation treatment daily and multiple doctor and test appointments and they have been unable to deliver.  Per patient, she is suppose to get her bed tomorrow.  Patient also stated she wants a lift chair, but she is going to ask her oncologist to write the order for it and she will let Shannon [name] know of the order. We discussed if she was eating and she shared she has a lot of nausea.  Inquired if she would like some Ensure supplement and she said she would like Vanilla.  Informed her this RNCM will bring the ensure to the [facility] for her to pick up when she goes for radiation.  Patient will reach out if she needs any assistance. [name], RN, BSN, CCM Care Management Coordinator [facility] Internal Medicine Phone: [phone]: [phone]

## 2021-10-22 ENCOUNTER — Inpatient Hospital Stay: Payer: Medicare Other

## 2021-10-22 ENCOUNTER — Ambulatory Visit
Admission: RE | Admit: 2021-10-22 | Discharge: 2021-10-22 | Disposition: A | Discharge status: Home / Self Care | Payer: Medicare Other | Source: Ambulatory Visit | Attending: Radiation Oncology | Admitting: Radiation Oncology

## 2021-10-22 ENCOUNTER — Other Ambulatory Visit: Payer: Self-pay

## 2021-10-22 ENCOUNTER — Inpatient Hospital Stay: Payer: Medicare Other | Attending: Internal Medicine

## 2021-10-22 DIAGNOSIS — M545 Low back pain, unspecified: Secondary | ICD-10-CM | POA: Insufficient documentation

## 2021-10-22 DIAGNOSIS — Z510 Encounter for antineoplastic radiation therapy: Secondary | ICD-10-CM | POA: Diagnosis not present

## 2021-10-22 DIAGNOSIS — C3412 Malignant neoplasm of upper lobe, left bronchus or lung: Secondary | ICD-10-CM | POA: Insufficient documentation

## 2021-10-22 DIAGNOSIS — C7951 Secondary malignant neoplasm of bone: Secondary | ICD-10-CM | POA: Insufficient documentation

## 2021-10-22 DIAGNOSIS — G4700 Insomnia, unspecified: Secondary | ICD-10-CM | POA: Insufficient documentation

## 2021-10-22 DIAGNOSIS — R5383 Other fatigue: Secondary | ICD-10-CM | POA: Insufficient documentation

## 2021-10-22 DIAGNOSIS — I10 Essential (primary) hypertension: Secondary | ICD-10-CM | POA: Insufficient documentation

## 2021-10-22 DIAGNOSIS — R918 Other nonspecific abnormal finding of lung field: Secondary | ICD-10-CM | POA: Insufficient documentation

## 2021-10-22 DIAGNOSIS — R0789 Other chest pain: Secondary | ICD-10-CM | POA: Insufficient documentation

## 2021-10-23 ENCOUNTER — Ambulatory Visit
Admission: RE | Admit: 2021-10-23 | Discharge: 2021-10-23 | Disposition: A | Discharge status: Home / Self Care | Payer: Medicare Other | Source: Ambulatory Visit | Attending: Radiation Oncology | Admitting: Radiation Oncology

## 2021-10-23 ENCOUNTER — Encounter (HOSPITAL_COMMUNITY): Payer: Self-pay | Admitting: Radiology

## 2021-10-23 ENCOUNTER — Inpatient Hospital Stay (HOSPITAL_BASED_OUTPATIENT_CLINIC_OR_DEPARTMENT_OTHER): Payer: Medicare Other | Admitting: Internal Medicine

## 2021-10-23 ENCOUNTER — Encounter: Payer: Self-pay | Admitting: Internal Medicine

## 2021-10-23 VITALS — BP 170/93 | HR 92 | Temp 97.6°F | Resp 20 | Ht 67.0 in | Wt 231.9 lb

## 2021-10-23 DIAGNOSIS — C3490 Malignant neoplasm of unspecified part of unspecified bronchus or lung: Secondary | ICD-10-CM | POA: Diagnosis not present

## 2021-10-23 DIAGNOSIS — Z510 Encounter for antineoplastic radiation therapy: Secondary | ICD-10-CM | POA: Diagnosis not present

## 2021-10-23 DIAGNOSIS — C3412 Malignant neoplasm of upper lobe, left bronchus or lung: Secondary | ICD-10-CM | POA: Diagnosis not present

## 2021-10-23 DIAGNOSIS — C7951 Secondary malignant neoplasm of bone: Secondary | ICD-10-CM | POA: Diagnosis not present

## 2021-10-23 NOTE — Progress Notes (Signed)
Patient Name  ?Shannon [name] Legal Sex  ?Female DOB  ?[birth year] SSN  ?[id number] Address  ?[address] [place]  ?[place] [address] Phone  ?[phone] [facility])  ?[phone] (Mobile) *Preferred*  ? ? RE: CT Biopsy ?Received: Today ?Arne Cleveland, MD  Garth Bigness D ?Ok  ? ?CT core R sacral met  ?I don't see a good non-osseous target  ? ?[facility]   ?  ?   ?Previous Messages ?  ?----- Message -----  ?From: Garth Bigness D  ?Sent: 10/23/2021  12:38 PM EST  ?To: [facility] Procedure Requests  ?Subject: CT Biopsy                                      ? ?Procedure:  CT Biopsy  ? ?Reason:  Malignant neoplasm of unspecified part of unspecified bronchus or lung, CT-guided core biopsy of the metastatic lesion in the sacral area. Soft tissue is preferred if possible  ? ?History:  CT, MR, NM in computer  ? ?Provider:  Curt Bears  ? ?Provider Contact:  [phone]  ?

## 2021-10-23 NOTE — Progress Notes (Signed)
[place] [place] [phone]   [phone]  OFFICE PROGRESS NOTE  [name], MD [name] [address]  DIAGNOSIS: Stage IA (T1c, N0, M0) non-small cell lung cancer, adenocarcinoma presented with left upper lobe pulmonary nodule diagnosed in June 2021.  PRIOR THERAPY: Curative radiotherapy to left upper lobe lung nodules under the care of Dr. Sondra Come completed April 22, 2020.  CURRENT THERAPY: None  INTERVAL HISTORY: Shannon [name] 66 y.o. female returns to the clinic today for follow-up visit after she was lost to follow-up for almost 2 years.  The patient underwent curative radiotherapy under the care of Dr. Freddi Che to the left upper lobe lung nodule in August 2021 and she was supposed to have a follow-up visit with me in few months after her treatment but she did not show up for her follow-up visit and was lost to follow-up since that time.  Over the last few months she has been complaining of pain on the left side of the chest as well as lower back.  She was seen by her primary care physician and treated with prednisone with no improvement.  She was then referred to orthopedic surgery and imaging studies including x-ray of the right ribs was difficult to exclude the mildly displaced fracture involving the right 10th rib.  The patient was then seen by Dr. Dorathy Daft and CT scan of the chest was performed on September 29, 2021 and showed a stable masslike opacity in the central left upper lobe in addition to subcentimeter bilateral pulmonary nodules and a dominant ill-defined nodular opacity in the right lower lobe remains stable.  There was new lytic bone metastasis involving the thoracic spine and ribs.  She also had MRI of the lumbar spine that showed scattered T1 hypointense lesions within the visualized spine concerning for metastatic disease.  The largest lesion at S1-2 expand the cortex posteriorly on the right side impinging on the right S1  nerve root.  There was pathologic fracture of the superior endplate of T55 without significant weight loss and no retropulsion.  Dr. Sondra Come order a PET scan which was performed on October 16, 2021 and it showed several hypermetabolic lytic osseous metastasis to the bilateral ribs, thoracic spine and sacrum all new since January 25, 2020 PET scan.  They treated index pulmonary nodule in the left upper lobe is significantly decreased in size and metabolism.  There was numerous additional small solid bilateral pulmonary nodules scattered throughout both lungs the largest 0.5 cm in the right upper lobe below PET resolution, all new since baseline PET scan on 01/25/2020.  Dr. Sondra Come referred the patient to me today for evaluation and recommendation regarding her condition.  When seen today she continues to complain of the pain in the lower back as well as the left and right side of the chest.  She is currently on pain medication by her pain clinic including MS Contin 15 mg p.o. every 12 hours addition to oxycodone 10 mg p.o. 6 hours.  She has no nausea, vomiting, diarrhea or constipation.  She denied having any headache or visual changes.  She has no recent weight loss or night sweats.   MEDICAL HISTORY: Past Medical History:  Diagnosis Date   Anemia    Anxiety    HX PANIC ATTACKS   Arthritis    "starting to; in my hands" (07/09/2015)   Asthma    Atrial fibrillation (HCC)    Atrial flutter, paroxysmal ([place])  Bloated abdomen    CFS (chronic fatigue syndrome)    Chewing difficulty    Chronic asthma with acute exacerbation    "I have chronic asthma all the time; sometimes exacerbations" (07/09/2015)   Chronic lower back pain    COPD (chronic obstructive pulmonary disease) ([place])    Cyst of right kidney    "3 of them; dx'd in ~ 01/2015"   Dyspnea    GERD (gastroesophageal reflux disease)    Heart murmur    History of blood transfusion    "related to my brain surgery I think"   History of pulmonary  embolism 07/09/2015   History of radiation therapy 04/09/20-04/22/20   SBRT Left Lung, Dr. Gery Pray   HIV antibody positive ([place])    HIV disease ([place])    Hyperlipidemia    Hypertension    Leg edema    Lipodystrophy    Mild CAD 2013   Multiple thyroid nodules    Osteopenia    Palpitations    Pneumonia 07/09/2015   Shingles    Vitamin B 12 deficiency    Vitamin D deficiency     ALLERGIES:  is allergic to tree extract, augmentin [amoxicillin-pot clavulanate], lisinopril, and ciprofloxacin.  MEDICATIONS:  Current Outpatient Medications  Medication Sig Dispense Refill   acetaminophen (TYLENOL) 500 MG tablet Take 2 tablets (1,000 mg total) by mouth every 6 (six) hours as needed. 100 tablet 2   albuterol (PROVENTIL) (2.5 MG/3ML) 0.083% nebulizer solution Take 3 mLs (2.5 mg total) by nebulization every 4 (four) hours as needed for wheezing or shortness of breath. 75 mL 2   albuterol (VENTOLIN HFA) 108 (90 Base) MCG/ACT inhaler Inhale into the lungs every 6 (six) hours as needed for wheezing or shortness of breath.     Ascorbic Acid (VITAMIN C) 1000 MG tablet Take 1,000 mg by mouth every other day.      b complex vitamins tablet Take 1 tablet by mouth daily.     BIKTARVY 50-200-25 MG TABS tablet TAKE 1 TABLET BY MOUTH DAILY 30 tablet 5   BIOTIN PO Take 1 tablet by mouth daily as needed (for supplementation).      calcium carbonate (OSCAL) 1500 (600 Ca) MG TABS tablet Take 600 mg of elemental calcium by mouth daily with breakfast.     Cholecalciferol (SM VITAMIN D3) 100 MCG (4000 UT) CAPS Take 1 capsule (4,000 Units total) by mouth daily. 30 capsule 0   cyanocobalamin (,VITAMIN B-12,) 1000 MCG/ML injection ADMINISTER 1 ML(1000 MCG) IN THE MUSCLE EVERY 30 DAYS 3 mL 1   cyclobenzaprine (FLEXERIL) 5 MG tablet Take 0.5-1 tablets (2.5-5 mg total) by mouth as needed for muscle spasms. 5 tablet 0   dexamethasone (DECADRON) 4 MG tablet Take 1 tablet (4 mg total) by mouth 2 (two) times daily with  a meal. 30 tablet 0   diltiazem (CARDIZEM CD) 240 MG 24 hr capsule TAKE 1 CAPSULE(240 MG) BY MOUTH DAILY 90 capsule 3   famotidine (PEPCID) 40 MG tablet Take 1 tablet (40 mg total) by mouth daily. 30 tablet 11   fluticasone furoate-vilanterol (BREO ELLIPTA) 100-25 MCG/ACT AEPB Inhale 1 puff into the lungs daily. 28 each 2   furosemide (LASIX) 20 MG tablet TAKE 1 TABLET(20 MG) BY MOUTH DAILY AS NEEDED 90 tablet 0   MAGNESIUM PO Take 1 tablet by mouth every other day.      Melatonin 5 MG TABS Take 5 mg by mouth at bedtime as needed (for sleep).  Misc. Devices (PULSE OXIMETER FOR FINGER) MISC 1 Units by Does not apply route as needed. 1 each 0   morphine (MS CONTIN) 15 MG 12 hr tablet Take 1 tablet by mouth every 12 hours. 30 tablet 0   Mouthwashes (BIOTENE DRY MOUTH GENTLE) LIQD Use as directed 1 Dose in the mouth or throat 2 (two) times daily as needed (dry mouth).     Omega-3 Fatty Acids (FISH OIL) 1000 MG CAPS Take 1,000 mg by mouth daily as needed (pt prefrence).     ondansetron (ZOFRAN) 4 MG tablet Take 1 tablet (4 mg total) by mouth every 8 (eight) hours as needed for nausea or vomiting. 20 tablet 0   Oxycodone HCl 10 MG TABS Take 10 mg by mouth 4 (four) times daily as needed.     OXYGEN Inhale 1.5-2 L into the lungs See admin instructions. Uses at bedtime and through the day as needed     spironolactone (ALDACTONE) 25 MG tablet Take 1 tablet (25 mg total) by mouth daily. 90 tablet 0   SYRINGE/NEEDLE, DISP, 1 ML (B-D SYRINGE/NEEDLE 1CC/25GX5/8) 25G X 5/8" 1 ML MISC 1 Units by Does not apply route every 30 (thirty) days. 50 each 0   XARELTO 20 MG TABS tablet TAKE 1 TABLET(20 MG) BY MOUTH DAILY 90 tablet 0   zinc gluconate 50 MG tablet Take 50 mg by mouth daily.     No current facility-administered medications for this visit.    SURGICAL HISTORY:  Past Surgical History:  Procedure Laterality Date   ABDOMINAL HYSTERECTOMY     "robotic laparosopic"   [place]   "brain tumor;  benign; on top of my brain; got a plate in there"   BRAIN SURGERY     age 27- -"Tumor pushing my skullout"   BRONCHIAL BIOPSY  02/13/2020   Procedure: BRONCHIAL BIOPSIES;  Surgeon: [name], MD;  Location: [facility];  Service: Pulmonary;;   BRONCHIAL BRUSHINGS  02/13/2020   Procedure: BRONCHIAL BRUSHINGS;  Surgeon: [name], MD;  Location: [facility] ENDOSCOPY;  Service: Pulmonary;;   BRONCHIAL NEEDLE ASPIRATION BIOPSY  02/13/2020   Procedure: BRONCHIAL NEEDLE ASPIRATION BIOPSIES;  Surgeon: [name], MD;  Location: [facility];  Service: Pulmonary;;   CARDIAC CATHETERIZATION     FIDUCIAL MARKER PLACEMENT  02/13/2020   Procedure: FIDUCIAL MARKER PLACEMENT;  Surgeon: [name], MD;  Location: [facility] ENDOSCOPY;  Service: Pulmonary;;   TONSILLECTOMY AND ADENOIDECTOMY     VIDEO BRONCHOSCOPY WITH ENDOBRONCHIAL NAVIGATION N/A 02/13/2020   Procedure: VIDEO BRONCHOSCOPY WITH ENDOBRONCHIAL NAVIGATION;  Surgeon: [name], MD;  Location: [facility] ENDOSCOPY;  Service: Pulmonary;  Laterality: N/A;    REVIEW OF SYSTEMS:  Constitutional: positive for fatigue Eyes: negative Ears, nose, mouth, throat, and face: negative Respiratory: positive for pleurisy/chest pain Cardiovascular: negative Gastrointestinal: negative Genitourinary:negative Integument/breast: negative Hematologic/lymphatic: negative Musculoskeletal:positive for back pain and bone pain Neurological: negative Behavioral/Psych: negative Endocrine: negative Allergic/Immunologic: negative   PHYSICAL EXAMINATION: General appearance: alert, cooperative, fatigued, and no distress Head: Normocephalic, without obvious abnormality, atraumatic Neck: no adenopathy, no JVD, supple, symmetrical, trachea midline, and thyroid not enlarged, symmetric, no tenderness/mass/nodules Lymph nodes: Cervical, supraclavicular, and axillary nodes normal. Resp: clear to auscultation bilaterally Back: symmetric, no curvature. ROM normal. No CVA  tenderness. Cardio: regular rate and rhythm, S1, S2 normal, no murmur, click, rub or gallop GI: soft, non-tender; bowel sounds normal; no masses,  no organomegaly Extremities: extremities normal, atraumatic, no cyanosis or edema Neurologic: Alert and oriented  X 3, normal strength and tone. Normal symmetric reflexes. Normal coordination and gait  ECOG PERFORMANCE STATUS: 1 - Symptomatic but completely ambulatory  Blood pressure (!) 170/93, pulse 92, temperature 97.6 F (36.4 C), temperature source Tympanic, resp. rate 20, height 5\' 7"  (1.702 m), weight 231 lb 14.4 oz (105.2 kg), SpO2 92 %.  LABORATORY DATA: Lab Results  Component Value Date   WBC 6.5 08/27/2021   HGB 12.9 08/27/2021   HCT 39.0 08/27/2021   MCV 92.4 08/27/2021   PLT 193 08/27/2021      Chemistry      Component Value Date/Time   NA 141 08/27/2021 1343   NA 144 06/30/2021 1429   K 4.7 08/27/2021 1343   CL 101 08/27/2021 1343   CO2 32 08/27/2021 1343   BUN 20 08/27/2021 1343   BUN 21 06/30/2021 1429   CREATININE 1.49 (H) 08/27/2021 1343      Component Value Date/Time   CALCIUM 10.2 08/27/2021 1343   ALKPHOS 98 03/12/2021 1020   AST 20 08/27/2021 1343   AST 24 08/20/2020 1232   ALT 12 08/27/2021 1343   ALT 20 08/20/2020 1232   BILITOT 0.5 08/27/2021 1343   BILITOT 0.2 03/12/2021 1020   BILITOT 0.4 08/20/2020 1232       RADIOGRAPHIC STUDIES: CT CHEST WO CONTRAST  Result Date: 09/30/2021 CLINICAL DATA:  Follow-up non-small cell lung carcinoma. Right-sided chest wall pain for several weeks. Previous radiation therapy. EXAM: CT CHEST WITHOUT CONTRAST TECHNIQUE: Multidetector CT imaging of the chest was performed following the standard protocol without IV contrast. RADIATION DOSE REDUCTION: This exam was performed according to the departmental dose-optimization program which includes automated exposure control, adjustment of the mA and/or kV according to patient size and/or use of iterative reconstruction  technique. COMPARISON:  04/25/2021 FINDINGS: Cardiovascular: No acute findings. Aortic and coronary atherosclerotic calcification noted. Mediastinum/Nodes: Stable enlargement of left thyroid lobe. No other masses or pathologically enlarged lymph nodes identified on this unenhanced exam. Lungs/Pleura: Masslike opacity in the central left upper lobe containing small areas of central calcification shows no significant change since previous study. This measures approximately 4.8 x 2.0 cm on image 78/2, compared to 4.9 x 2.0 cm previously. Several sub-cm pulmonary nodules are again seen peripherally within the lingula, largest measuring 8 mm on image 74/5. These show no significant change. Other scattered sub-cm pulmonary nodules are again seen throughout both lungs which show no significant change. A dominant poorly defined nodular opacity is seen in the medial right lower lobe measuring 1.7 x 1.7 cm on image 91/5, without significant change in size when remeasured on previous study. No definite new or enlarging pulmonary nodules or masses identified. No evidence of pleural effusion. Upper Abdomen: Several small right renal cysts are stable. Otherwise unremarkable. Musculoskeletal: Lytic bone lesions are seen involving the T9 vertebra and right posterior 10th rib, and bilateral 5th ribs, which are new since previous study. Several other nondisplaced left rib fractures are also noted which may be pathologic. These are consistent with lytic bone metastases. IMPRESSION: Stable masslike opacity in central left upper lobe. Sub-cm bilateral pulmonary nodules and dominant ill-defined nodular opacity in right lower lobe also remain stable. New lytic bone metastases involving the thoracic spine and ribs, as described above. Aortic Atherosclerosis (ICD10-I70.0). Electronically Signed   By: Marlaine Hind M.D.   On: 09/30/2021 10:18   MR LUMBAR SPINE WO CONTRAST  Result Date: 09/30/2021 CLINICAL DATA:  Lumbar radiculopathy M54.16  (ICD-10-CM). EXAM: MRI LUMBAR SPINE WITHOUT CONTRAST  TECHNIQUE: Multiplanar, multisequence MR imaging of the lumbar spine was performed. No intravenous contrast was administered. COMPARISON:  MRI of the lumbar spine September 01, 2017. FINDINGS: Segmentation:  Standard. Alignment:  Grade 1 anterolisthesis of L4 over L5, unchanged. Vertebrae: Scattered T1 hypointense lesions within the visualized spine concerning for metastatic disease. Pathologic fracture of the superior endplate of K93 with minimal height loss and no retropulsion. The largest lesion is seen involving the sacrum at S1-2 on the right, expanding the cortex posteriorly resulting in impingement of the right S1 nerve root. Conus medullaris and cauda equina: Conus extends to the L1 level. Conus and cauda equina appear normal. Paraspinal and other soft tissues: Bilateral renal cysts, some of which appear hemorrhagic. Disc levels: T12-L1: No spinal canal or neural foraminal stenosis. L1-2: No spinal canal or neural foraminal stenosis. L2-3: Shallow disc bulge with tiny right foraminal/far lateral disc protrusion and mild facet degenerative changes resulting in mild right neural foraminal narrowing. No spinal canal stenosis. L3-4: Shallow disc bulge and mild facet degenerative changes resulting in mild bilateral neural foraminal narrowing. No significant spinal canal stenosis. L4-5: Disc bulge/disc uncovering and hypertrophic facet degenerative changes with ligamentum flavum redundancy resulting in mild spinal canal stenosis with narrowing of the bilateral subarticular zones and mild to moderate bilateral neural foraminal narrowing, progressed since prior MRI. L5-S1: Shallow disc bulge and mild facet degenerative changes without significant spinal canal or neural foraminal stenosis. S1-2: Partially visualized on the axial views. Expansile lesion on the right side at S1-2 causing posterior bulging of the cortex resulting in impingement of the right S1 nerve  root. IMPRESSION: 1. Scattered T1 hypointense lesions within the visualized spine concerning for metastatic disease. 2. The largest lesion at S1-2 expands the cortex posteriorly on the right side, impinging on the right S1 nerve root. 3. Pathologic fracture of the superior endplate of G18 without significant height loss and no retropulsion. These results will be called to the ordering clinician or representative by the Radiologist Assistant, and communication documented in the PACS or [employer]. Electronically Signed   By: Pedro Earls M.D.   On: 09/30/2021 10:33   NM PET Image Restag (PS) Skull Base To Thigh  Result Date: 10/20/2021 CLINICAL DATA:  Subsequent treatment strategy for non-small cell left upper lobe lung cancer with new lytic bone metastases on chest CT. EXAM: NUCLEAR MEDICINE PET SKULL BASE TO THIGH TECHNIQUE: 11.1 mCi F-18 FDG was injected intravenously. Full-ring PET imaging was performed from the skull base to thigh after the radiotracer. CT data was obtained and used for attenuation correction and anatomic localization. Fasting blood glucose: Seventy-one mg/dl COMPARISON:  09/29/2021 chest CT.  01/25/2020 PET-CT. FINDINGS: Mediastinal blood pool activity: SUV max 2.5 Liver activity: SUV max NA NECK: No hypermetabolic lymph nodes in the neck. Incidental CT findings: Enlarged heterogeneous thyroid gland with heterogeneous thyroid hypermetabolism, asymmetrically prominent in the left thyroid lobe without discrete thyroid nodules. This has been evaluated on previous imaging. (ref: J Am Coll Radiol. 2015 Feb;12(2): 143-50).See 02/06/2020 thyroid ultrasound. CHEST: No enlarged or hypermetabolic axillary, mediastinal or hilar lymph nodes. Solid 1.0 cm left upper lobe pulmonary nodule with internal fiducial marker with no significant FDG uptake with max SUV 1.1 (series 8/image 35), decreased from 2.5 cm with previous max SUV 7.1 on 01/25/2020 PET-CT. Fairly sharply marginated  parahilar consolidation in the central left upper lobe demonstrates homogeneous low-level FDG uptake with max SUV 4.1, most compatible with evolving radiation fibrosis. A sub solid 1.5 cm medial  right lower lobe pulmonary nodule (series 8/image 44) with max SUV 1.7, previously 1.5 cm with max SUV 1.5 on 01/25/2020 PET-CT, not substantially changed. Numerous (greater than 10) additional small solid pulmonary nodules scattered throughout both lungs, largest 0.5 cm in the medial right upper lobe (series 8/image 22), below PET resolution, all new since 01/25/2020 PET-CT, unchanged from 09/29/2021 chest CT. Incidental CT findings: Coronary atherosclerosis. Borderline mild cardiomegaly. Atherosclerotic thoracic aorta with dilated 4.2 cm ascending thoracic aorta. ABDOMEN/PELVIS: No abnormal hypermetabolic activity within the liver, pancreas, adrenal glands, or spleen. No hypermetabolic lymph nodes in the abdomen or pelvis. Incidental CT findings: Several hyperdense bilateral renal cortical lesions, largest 2.3 cm in the anterior lower left kidney (series 4/image 120), not substantially changed. Mild-to-moderate colonic diverticulosis. Hysterectomy. Atherosclerotic nonaneurysmal abdominal aorta. SKELETON: Several hypermetabolic lytic osseous metastasis to the bilateral ribs, thoracic spine and sacrum, all new since 01/25/2020 PET-CT. Representative lesions as follows: -right sacral ala 3.6 cm lesion with max SUV 6.1 (series 4/image 142) -right T9/T10 vertebral and costovertebral junction lesion with max SUV 6.3 (series 4/image 79) -lateral right fifth rib 1.2 cm lesion with max SUV 3.9 (series 4/image 62) -posterior left fourth rib 1.4 cm lesion with max SUV 5.2 (series 4/image 53) Incidental CT findings: none IMPRESSION: 1. Several hypermetabolic lytic osseous metastases to the bilateral ribs, thoracic spine and sacrum, all new since 01/25/2020 PET-CT. 2. Treated index pulmonary nodule in the left upper lobe is  significantly decreased in size and metabolism compared to baseline 01/25/2020 PET-CT. 3. Numerous additional small solid bilateral pulmonary nodules scattered throughout both lungs, largest 0.5 cm in the right upper lobe, below PET resolution, all new since baseline 01/25/2020 PET-CT, unchanged from 09/29/2021 chest CT, suspicious for small bilateral pulmonary metastases. 4. Chronic findings include: Aortic Atherosclerosis (ICD10-I70.0). Coronary atherosclerosis. Borderline mild cardiomegaly. Dilated 4.2 cm ascending thoracic aorta. Mild-to-moderate colonic diverticulosis. Electronically Signed   By: Ilona Sorrel M.D.   On: 10/20/2021 08:16     ASSESSMENT AND PLAN: This is a 66 years old African-American female with a stage IA (T1c, N0, M0) non-small cell lung cancer, adenocarcinoma presented with left upper lobe lung nodule diagnosed in June 2021. The patient underwent SBRT to the left upper lobe lung nodule under the care of Dr. Dorathy Daft completed in August 2021. She was lost to follow-up with me since that time.  She was seen by Dr. Sondra Come few times during that peroid. Unfortunately she presented with evidence for metastatic disease involving several areas of the bones including the ribs as well as thoracic spine and lumbar spine as well as sacrum.  She also has numerous small solid bilateral pulmonary nodules. I had a lengthy discussion with the patient today about her condition and treatment options. I recommended for the patient to have CT-guided core biopsy of one of the lytic lesions in the sacral area or any other accessible lytic bone lesions for confirmation of her disease recurrence and also to have tissue to run molecular studies if needed. For the pain management she will continue with her current pain medication for now. I will see the patient back for follow-up visit in around 2 weeks for evaluation and discussion of her treatment options based on the final pathology.  She was also seen by  Dr. Sondra Come and she would benefit from palliative radiotherapy to the metastatic bone lesions. I will also order MRI of the brain to rule out brain metastasis. The patient was advised to call immediately if she has any other  concerning symptoms in the interval. For hypertension she was advised to continue on her blood pressure medication and to monitor it closely at home.   The patient voices understanding of current disease status and treatment options and is in agreement with the current care plan.  All questions were answered. The patient knows to call the clinic with any problems, questions or concerns. We can certainly see the patient much sooner if necessary.  The total time spent in the appointment was 35 minutes.  Disclaimer: This note was dictated with voice recognition software. Similar sounding words can inadvertently be transcribed and may not be corrected upon review.

## 2021-10-23 NOTE — Progress Notes (Signed)
Patient Name  ?Shannon [name] Legal Sex  ?Female DOB  ?[birth year] SSN  ?[id number] Address  ?[address]  ?[place] [address] Phone  ?[phone] [facility])  ?[phone] (Mobile) *Preferred*  ? ? RE: CT Biopsy ?Received: Today ?Curt Bears, MD  Anabel Bene, MD ?Please hold the required days for the procedure.  Thank you   ?  ?   ?Previous Messages ?  ?----- Message -----  ?From: Garth Bigness D  ?Sent: 10/23/2021  12:48 PM EST  ?To: Skeet Latch, MD, Curt Bears, MD  ?Subject: FW: CT Biopsy                                  ? ?Good afternoon, Dr. Oval Linsey, [name] has an order for a biopsy to be done and I noticed that patient is on Xarelto. Please advise if it is okay to hold for 1 day prior to biopsy. Thanks Aniceto Boss  ?----- Message -----  ?From: Garth Bigness D  ?Sent: 10/23/2021  12:38 PM EST  ?To: [facility] Procedure Requests  ?Subject: CT Biopsy                                      ? ?Procedure:  CT Biopsy  ? ?Reason:  Malignant neoplasm of unspecified part of unspecified bronchus or lung, CT-guided core biopsy of the metastatic lesion in the sacral area. Soft tissue is preferred if possible  ? ?History:  CT, MR, NM in computer  ? ?Provider:  Curt Bears  ? ?Provider Contact:  [phone]  ?

## 2021-10-24 ENCOUNTER — Ambulatory Visit
Admission: RE | Admit: 2021-10-24 | Discharge: 2021-10-24 | Disposition: A | Discharge status: Home / Self Care | Payer: Medicare Other | Source: Ambulatory Visit | Attending: Radiation Oncology | Admitting: Radiation Oncology

## 2021-10-24 DIAGNOSIS — C7951 Secondary malignant neoplasm of bone: Secondary | ICD-10-CM | POA: Diagnosis not present

## 2021-10-24 DIAGNOSIS — C3412 Malignant neoplasm of upper lobe, left bronchus or lung: Secondary | ICD-10-CM | POA: Diagnosis not present

## 2021-10-24 DIAGNOSIS — Z510 Encounter for antineoplastic radiation therapy: Secondary | ICD-10-CM | POA: Diagnosis not present

## 2021-10-27 ENCOUNTER — Other Ambulatory Visit: Payer: Self-pay

## 2021-10-27 ENCOUNTER — Ambulatory Visit: Payer: Self-pay

## 2021-10-27 ENCOUNTER — Telehealth: Payer: Self-pay | Admitting: Internal Medicine

## 2021-10-27 ENCOUNTER — Telehealth: Payer: Self-pay

## 2021-10-27 ENCOUNTER — Ambulatory Visit
Admission: RE | Admit: 2021-10-27 | Discharge: 2021-10-27 | Disposition: A | Discharge status: Home / Self Care | Payer: Medicare Other | Source: Ambulatory Visit | Attending: Radiation Oncology | Admitting: Radiation Oncology

## 2021-10-27 ENCOUNTER — Ambulatory Visit: Payer: Medicare Other

## 2021-10-27 DIAGNOSIS — C3412 Malignant neoplasm of upper lobe, left bronchus or lung: Secondary | ICD-10-CM | POA: Diagnosis not present

## 2021-10-27 DIAGNOSIS — Z510 Encounter for antineoplastic radiation therapy: Secondary | ICD-10-CM | POA: Diagnosis not present

## 2021-10-27 DIAGNOSIS — C7951 Secondary malignant neoplasm of bone: Secondary | ICD-10-CM | POA: Diagnosis not present

## 2021-10-27 NOTE — Telephone Encounter (Signed)
Patient had questions about her biopsy, so I returned her call and let her know that someone in CT would be able to answer her questions more appropriately. I answered what questions she did have to the best of my ability, and gave her the number to central scheduling for someone to assist her with any further questions. The patient verbalized understanding and thanks.  ?

## 2021-10-27 NOTE — Telephone Encounter (Signed)
Patient's sister called and wanted to know why the patient was getting a biopsy and CT scan. The patient was unable to relay this information to her sister because she "zoned out" during her appointment after Dr. Julien Nordmann mentioned hospice. I explained to the sister that the biopsy and MRI are being done to see if there is progression of the cancer and to help the Dr determine if the current treatment plan will need to be altered. The sister would like to be on the phone with the patient during the next visit. I explained to her that she could call in while the patient is here. She verbalized understanding and had no further questions or concerns.  ?

## 2021-10-27 NOTE — Telephone Encounter (Signed)
Scheduled per 03/02 los, patient has been called and notified of upcoming appointments. ?

## 2021-10-27 NOTE — Telephone Encounter (Signed)
Called patient in regards to mychart message sent in, left patient a voicemail to call us back to get scheduled.  ?

## 2021-10-27 NOTE — Chronic Care Management (AMB) (Signed)
? ?  10/27/2021 ? ?Shannon [name] ?03-12-1956 ?[id number] ? ?Outgoing call to patient to see if she had received her hospital bed.  Patient has not received her hospital bed but she says [facility] has reached out to her multiple times, she has just been so busy with radiation and scans that she has not been able to find to right time.  Message sent to [place] at [facility] as patient would like to see if she can get her bed tomorrow afternoon after 1:30.  Patient notes that tomorrow is her last day of radiation but she is having a biopsy on Thursday and she is not able to go up stairs anymore to her bedroom.  Response from [place] at [place] stated patient should have gotten her bed by now.  She will look into it and make sure she gets it as soon as possible. ?[name], RN, BSN, CCM ?Care Management Coordinator ?[facility] ?Phone: [phone]/XUC: [phone]  ? ?

## 2021-10-28 ENCOUNTER — Ambulatory Visit
Admission: RE | Admit: 2021-10-28 | Discharge: 2021-10-28 | Disposition: A | Discharge status: Home / Self Care | Payer: Medicare Other | Source: Ambulatory Visit | Attending: Radiation Oncology | Admitting: Radiation Oncology

## 2021-10-28 ENCOUNTER — Encounter: Payer: Self-pay | Admitting: Radiation Oncology

## 2021-10-28 ENCOUNTER — Other Ambulatory Visit: Payer: Self-pay | Admitting: Cardiovascular Disease

## 2021-10-28 DIAGNOSIS — C7951 Secondary malignant neoplasm of bone: Secondary | ICD-10-CM | POA: Diagnosis not present

## 2021-10-28 DIAGNOSIS — Z510 Encounter for antineoplastic radiation therapy: Secondary | ICD-10-CM | POA: Diagnosis not present

## 2021-10-28 DIAGNOSIS — C3412 Malignant neoplasm of upper lobe, left bronchus or lung: Secondary | ICD-10-CM | POA: Diagnosis not present

## 2021-10-29 ENCOUNTER — Other Ambulatory Visit (HOSPITAL_COMMUNITY): Payer: Self-pay | Admitting: Physician Assistant

## 2021-10-29 DIAGNOSIS — G4733 Obstructive sleep apnea (adult) (pediatric): Secondary | ICD-10-CM | POA: Diagnosis not present

## 2021-10-29 DIAGNOSIS — R911 Solitary pulmonary nodule: Secondary | ICD-10-CM | POA: Diagnosis not present

## 2021-10-29 DIAGNOSIS — R269 Unspecified abnormalities of gait and mobility: Secondary | ICD-10-CM | POA: Diagnosis not present

## 2021-10-29 DIAGNOSIS — J45909 Unspecified asthma, uncomplicated: Secondary | ICD-10-CM | POA: Diagnosis not present

## 2021-10-29 DIAGNOSIS — J449 Chronic obstructive pulmonary disease, unspecified: Secondary | ICD-10-CM | POA: Diagnosis not present

## 2021-10-29 NOTE — H&P (Incomplete)
Chief Complaint: Patient was seen in consultation today for metastatic lesion of the sacrum  at the request of Mohamed,Mohamed  Supervising Physician: Arne Cleveland  Patient Status: [name] - Out-pt  History of Present Illness: Shannon [name] 66 y.o. female with history of lung cancer lost to follow up.  Over the last few months she has been complaining of pain on the left side of the chest as well as lower back.  She was seen by her primary care physician and treated with prednisone with no improvement.   CT chest 09/29/21 showed a LUL mass, subcentimeter bilateral pulmonary nodules, and a dominant ill-defined nodular opacity in the right lower lobe.  New lytic bone metastasis involving the thoracic spine and ribs.  MR lumbar spine showed scattered lesions within the spine concerning for metastatic disease.  PET showed several hypermetabolic lytic osseous metastasis to the bilateral ribs, thoracic spine and sacrum.   She has persistent pain in the lower back as well as the left and right side of the chest.  She is currently on pain medication by her pain clinic including MS Contin 15 mg p.o. every 12 hours addition to oxycodone 10 mg p.o. 6 hours.  She has no nausea, vomiting, diarrhea or constipation.  She denied having any headache or visual changes.  She has no recent weight loss or night sweats. Case reviewed by Dr. Vernard Gambles and approved for CT core right sacral lesion biopsy.   Past Medical History:  Diagnosis Date   Anemia    Anxiety    HX PANIC ATTACKS   Arthritis    "starting to; in my hands" (07/09/2015)   Asthma    Atrial fibrillation (HCC)    Atrial flutter, paroxysmal (HCC)    Bloated abdomen    CFS (chronic fatigue syndrome)    Chewing difficulty    Chronic asthma with acute exacerbation    "I have chronic asthma all the time; sometimes exacerbations" (07/09/2015)   Chronic lower back pain    COPD (chronic obstructive pulmonary disease) (HCC)    Cyst of right kidney     "3 of them; dx'd in ~ 01/2015"   Dyspnea    GERD (gastroesophageal reflux disease)    Heart murmur    History of blood transfusion    "related to my brain surgery I think"   History of pulmonary embolism 07/09/2015   History of radiation therapy 04/09/20-04/22/20   SBRT Left Lung, Dr. Gery Pray   HIV antibody positive ([place])    HIV disease ([place])    Hyperlipidemia    Hypertension    Leg edema    Lipodystrophy    Mild CAD 2013   Multiple thyroid nodules    Osteopenia    Palpitations    Pneumonia 07/09/2015   Shingles    Vitamin B 12 deficiency    Vitamin D deficiency     Past Surgical History:  Procedure Laterality Date   ABDOMINAL HYSTERECTOMY     "robotic laparosopic"   [place]   "brain tumor; benign; on top of my brain; got a plate in there"   BRAIN SURGERY     age 57- -"Tumor pushing my skullout"   BRONCHIAL BIOPSY  02/13/2020   Procedure: BRONCHIAL BIOPSIES;  Surgeon: [name], MD;  Location: [facility];  Service: Pulmonary;;   BRONCHIAL BRUSHINGS  02/13/2020   Procedure: BRONCHIAL BRUSHINGS;  Surgeon: [name], MD;  Location[facility] ENDOSCOPY;  Service: Pulmonary;;   BRONCHIAL NEEDLE ASPIRATION  BIOPSY  02/13/2020   Procedure: BRONCHIAL NEEDLE ASPIRATION BIOPSIES;  Surgeon: [name], MD;  Location: [facility];  Service: Pulmonary;;   CARDIAC CATHETERIZATION     FIDUCIAL MARKER PLACEMENT  02/13/2020   Procedure: FIDUCIAL MARKER PLACEMENT;  Surgeon: [name], MD;  Location: [facility] ENDOSCOPY;  Service: Pulmonary;;   TONSILLECTOMY AND ADENOIDECTOMY     VIDEO BRONCHOSCOPY WITH ENDOBRONCHIAL NAVIGATION N/A 02/13/2020   Procedure: VIDEO BRONCHOSCOPY WITH ENDOBRONCHIAL NAVIGATION;  Surgeon: [name], MD;  Location: [facility] ENDOSCOPY;  Service: Pulmonary;  Laterality: N/A;    Allergies: Tree extract, Augmentin [amoxicillin-pot clavulanate], Lisinopril, and Ciprofloxacin  Medications: Prior to Admission medications   Medication Sig Start  Date End Date Taking? Authorizing Provider  acetaminophen (TYLENOL) 500 MG tablet Take 2 tablets (1,000 mg total) by mouth every 6 (six) hours as needed. 02/28/21 02/28/22  Riesa Pope, MD  albuterol (PROVENTIL) (2.5 MG/3ML) 0.083% nebulizer solution Take 3 mLs (2.5 mg total) by nebulization every 4 (four) hours as needed for wheezing or shortness of breath. 04/16/21 04/16/22  Madalyn Rob, MD  albuterol (VENTOLIN HFA) 108 (90 Base) MCG/ACT inhaler Inhale into the lungs every 6 (six) hours as needed for wheezing or shortness of breath.    [provider]  Ascorbic Acid (VITAMIN C) 1000 MG tablet Take 1,000 mg by mouth every other day.     [provider]  b complex vitamins tablet Take 1 tablet by mouth daily.    [provider]  BIKTARVY 50-200-25 MG TABS tablet TAKE 1 TABLET BY MOUTH DAILY 09/01/21   [name], MD  BIOTIN PO Take 1 tablet by mouth daily as needed (for supplementation).     [provider]  calcium carbonate (OSCAL) 1500 (600 Ca) MG TABS tablet Take 600 mg of elemental calcium by mouth daily with breakfast.    [provider]  Cholecalciferol (SM VITAMIN D3) 100 MCG (4000 UT) CAPS Take 1 capsule (4,000 Units total) by mouth daily. 04/18/19   Abby Potash, PA-C  cyanocobalamin (,VITAMIN B-12,) 1000 MCG/ML injection ADMINISTER 1 ML(1000 MCG) IN THE MUSCLE EVERY 30 DAYS 10/14/21   [name], MD  cyclobenzaprine (FLEXERIL) 5 MG tablet Take 0.5-1 tablets (2.5-5 mg total) by mouth as needed for muscle spasms. 08/28/21   Mitzi Hansen, MD  dexamethasone (DECADRON) 4 MG tablet Take 1 tablet (4 mg total) by mouth 2 (two) times daily with a meal. 10/15/21   Gery Pray, MD  diltiazem (CARDIZEM CD) 240 MG 24 hr capsule TAKE 1 CAPSULE(240 MG) BY MOUTH DAILY 07/12/21   [name], MD  famotidine (PEPCID) 40 MG tablet Take 1 tablet (40 mg total) by mouth daily. 09/04/21   Esterwood, Amy S, PA-C  fluticasone furoate-vilanterol (BREO  ELLIPTA) 100-25 MCG/ACT AEPB Inhale 1 puff into the lungs daily. 08/19/21   Madalyn Rob, MD  furosemide (LASIX) 20 MG tablet TAKE 1 TABLET(20 MG) BY MOUTH DAILY AS NEEDED 07/12/21   Gilles Chiquito B, MD  MAGNESIUM PO Take 1 tablet by mouth every other day.     [provider]  Melatonin 5 MG TABS Take 5 mg by mouth at bedtime as needed (for sleep).    [provider]  Misc. Devices (PULSE OXIMETER FOR FINGER) MISC 1 Units by Does not apply route as needed. 01/19/19   Skeet Latch, MD  morphine (MS CONTIN) 15 MG 12 hr tablet Take 1 tablet by mouth every 12 hours. 10/15/21   Gery Pray, MD  Mouthwashes Charlene Brooke  DRY MOUTH GENTLE) LIQD Use as directed 1 Dose in the mouth or throat 2 (two) times daily as needed (dry mouth). 02/13/20   [name], MD  Omega-3 Fatty Acids (FISH OIL) 1000 MG CAPS Take 1,000 mg by mouth daily as needed (pt prefrence).    [provider]  ondansetron (ZOFRAN) 4 MG tablet Take 1 tablet (4 mg total) by mouth every 8 (eight) hours as needed for nausea or vomiting. 10/15/21   Gery Pray, MD  Oxycodone HCl 10 MG TABS Take 10 mg by mouth 4 (four) times daily as needed. 09/30/21   [provider]  OXYGEN Inhale 1.5-2 L into the lungs See admin instructions. Uses at bedtime and through the day as needed    [provider]  spironolactone (ALDACTONE) 25 MG tablet Take 1 tablet (25 mg total) by mouth daily. 08/28/21 11/26/21  Mitzi Hansen, MD  SYRINGE/NEEDLE, DISP, 1 ML (B-D SYRINGE/NEEDLE 1CC/25GX5/8) 25G X 5/8" 1 ML MISC 1 Units by Does not apply route every 30 (thirty) days. 04/14/19   [name], MD  XARELTO 20 MG TABS tablet TAKE 1 TABLET(20 MG) BY MOUTH DAILY 07/24/21   Skeet Latch, MD  zinc gluconate 50 MG tablet Take 50 mg by mouth daily.    [provider]     Family History  Problem Relation Age of Onset   Asthma Mother    Heart failure Mother        cardiomyopathy   Thyroid disease Mother    Heart  disease Mother    Obesity Mother    Hypertension Mother    Heart murmur Sister    Heart murmur Brother    Diabetes Sister    Thyroid disease Sister    Heart murmur Sister     Social History   Socioeconomic History   Marital status: Single    Spouse name: Not on file   Number of children: Not on file   Years of education: Not on file   Highest education level: Not on file  Occupational History   Occupation: student  Tobacco Use   Smoking status: Former    Types: Cigarettes   Smokeless tobacco: Never   Tobacco comments:    Pt states she smoked when she was in college many years ago.  Vaping Use   Vaping Use: Never used  Substance and Sexual Activity   Alcohol use: Not Currently    Alcohol/week: 0.0 standard drinks    Comment: Rarely.   Drug use: No   Sexual activity: Not Currently    Partners: Male    Comment: declined condoms  Other Topics Concern   Not on file  Social History Narrative   Originally from [place] then [place] now [place] 2016 approx   Disabled    Rare alcohol, never smoker, no drug use            Social Determinants of Radio broadcast assistant Strain: Not on file  Food Insecurity: No Food Insecurity   Worried About Charity fundraiser in the Last Year: Never true   Arboriculturist in the Last Year: Never true  Transportation Needs: Not on file  Physical Activity: Inactive   Days of Exercise per Week: 0 days   Minutes of Exercise per Session: 0 min  Stress: Not on file  Social Connections: Not on file      Review of Systems: A 12 point ROS discussed and pertinent positives are indicated in the  HPI above.  All other systems are negative.  Review of Systems  Vital Signs: There were no vitals taken for this visit.  Physical Exam  Imaging: CT CHEST WO CONTRAST  Result Date: 09/30/2021 CLINICAL DATA:  Follow-up non-small cell lung carcinoma. Right-sided chest wall pain for several weeks. Previous radiation therapy. EXAM: CT CHEST WITHOUT CONTRAST  TECHNIQUE: Multidetector CT imaging of the chest was performed following the standard protocol without IV contrast. RADIATION DOSE REDUCTION: This exam was performed according to the departmental dose-optimization program which includes automated exposure control, adjustment of the mA and/or kV according to patient size and/or use of iterative reconstruction technique. COMPARISON:  04/25/2021 FINDINGS: Cardiovascular: No acute findings. Aortic and coronary atherosclerotic calcification noted. Mediastinum/Nodes: Stable enlargement of left thyroid lobe. No other masses or pathologically enlarged lymph nodes identified on this unenhanced exam. Lungs/Pleura: Masslike opacity in the central left upper lobe containing small areas of central calcification shows no significant change since previous study. This measures approximately 4.8 x 2.0 cm on image 78/2, compared to 4.9 x 2.0 cm previously. Several sub-cm pulmonary nodules are again seen peripherally within the lingula, largest measuring 8 mm on image 74/5. These show no significant change. Other scattered sub-cm pulmonary nodules are again seen throughout both lungs which show no significant change. A dominant poorly defined nodular opacity is seen in the medial right lower lobe measuring 1.7 x 1.7 cm on image 91/5, without significant change in size when remeasured on previous study. No definite new or enlarging pulmonary nodules or masses identified. No evidence of pleural effusion. Upper Abdomen: Several small right renal cysts are stable. Otherwise unremarkable. Musculoskeletal: Lytic bone lesions are seen involving the T9 vertebra and right posterior 10th rib, and bilateral 5th ribs, which are new since previous study. Several other nondisplaced left rib fractures are also noted which may be pathologic. These are consistent with lytic bone metastases. IMPRESSION: Stable masslike opacity in central left upper lobe. Sub-cm bilateral pulmonary nodules and dominant  ill-defined nodular opacity in right lower lobe also remain stable. New lytic bone metastases involving the thoracic spine and ribs, as described above. Aortic Atherosclerosis (ICD10-I70.0). Electronically Signed   By: Marlaine Hind M.D.   On: 09/30/2021 10:18   MR LUMBAR SPINE WO CONTRAST  Result Date: 09/30/2021 CLINICAL DATA:  Lumbar radiculopathy M54.16 (ICD-10-CM). EXAM: MRI LUMBAR SPINE WITHOUT CONTRAST TECHNIQUE: Multiplanar, multisequence MR imaging of the lumbar spine was performed. No intravenous contrast was administered. COMPARISON:  MRI of the lumbar spine September 01, 2017. FINDINGS: Segmentation:  Standard. Alignment:  Grade 1 anterolisthesis of L4 over L5, unchanged. Vertebrae: Scattered T1 hypointense lesions within the visualized spine concerning for metastatic disease. Pathologic fracture of the superior endplate of N02 with minimal height loss and no retropulsion. The largest lesion is seen involving the sacrum at S1-2 on the right, expanding the cortex posteriorly resulting in impingement of the right S1 nerve root. Conus medullaris and cauda equina: Conus extends to the L1 level. Conus and cauda equina appear normal. Paraspinal and other soft tissues: Bilateral renal cysts, some of which appear hemorrhagic. Disc levels: T12-L1: No spinal canal or neural foraminal stenosis. L1-2: No spinal canal or neural foraminal stenosis. L2-3: Shallow disc bulge with tiny right foraminal/far lateral disc protrusion and mild facet degenerative changes resulting in mild right neural foraminal narrowing. No spinal canal stenosis. L3-4: Shallow disc bulge and mild facet degenerative changes resulting in mild bilateral neural foraminal narrowing. No significant spinal canal stenosis. L4-5: Disc bulge/disc uncovering and  hypertrophic facet degenerative changes with ligamentum flavum redundancy resulting in mild spinal canal stenosis with narrowing of the bilateral subarticular zones and mild to moderate bilateral  neural foraminal narrowing, progressed since prior MRI. L5-S1: Shallow disc bulge and mild facet degenerative changes without significant spinal canal or neural foraminal stenosis. S1-2: Partially visualized on the axial views. Expansile lesion on the right side at S1-2 causing posterior bulging of the cortex resulting in impingement of the right S1 nerve root. IMPRESSION: 1. Scattered T1 hypointense lesions within the visualized spine concerning for metastatic disease. 2. The largest lesion at S1-2 expands the cortex posteriorly on the right side, impinging on the right S1 nerve root. 3. Pathologic fracture of the superior endplate of Q73 without significant height loss and no retropulsion. These results will be called to the ordering clinician or representative by the Radiologist Assistant, and communication documented in the PACS or [employer]. Electronically Signed   By: Pedro Earls M.D.   On: 09/30/2021 10:33   NM PET Image Restag (PS) Skull Base To Thigh  Result Date: 10/20/2021 CLINICAL DATA:  Subsequent treatment strategy for non-small cell left upper lobe lung cancer with new lytic bone metastases on chest CT. EXAM: NUCLEAR MEDICINE PET SKULL BASE TO THIGH TECHNIQUE: 11.1 mCi F-18 FDG was injected intravenously. Full-ring PET imaging was performed from the skull base to thigh after the radiotracer. CT data was obtained and used for attenuation correction and anatomic localization. Fasting blood glucose: Seventy-one mg/dl COMPARISON:  09/29/2021 chest CT.  01/25/2020 PET-CT. FINDINGS: Mediastinal blood pool activity: SUV max 2.5 Liver activity: SUV max NA NECK: No hypermetabolic lymph nodes in the neck. Incidental CT findings: Enlarged heterogeneous thyroid gland with heterogeneous thyroid hypermetabolism, asymmetrically prominent in the left thyroid lobe without discrete thyroid nodules. This has been evaluated on previous imaging. (ref: J Am Coll Radiol. 2015 Feb;12(2):  143-50).See 02/06/2020 thyroid ultrasound. CHEST: No enlarged or hypermetabolic axillary, mediastinal or hilar lymph nodes. Solid 1.0 cm left upper lobe pulmonary nodule with internal fiducial marker with no significant FDG uptake with max SUV 1.1 (series 8/image 35), decreased from 2.5 cm with previous max SUV 7.1 on 01/25/2020 PET-CT. Fairly sharply marginated parahilar consolidation in the central left upper lobe demonstrates homogeneous low-level FDG uptake with max SUV 4.1, most compatible with evolving radiation fibrosis. A sub solid 1.5 cm medial right lower lobe pulmonary nodule (series 8/image 44) with max SUV 1.7, previously 1.5 cm with max SUV 1.5 on 01/25/2020 PET-CT, not substantially changed. Numerous (greater than 10) additional small solid pulmonary nodules scattered throughout both lungs, largest 0.5 cm in the medial right upper lobe (series 8/image 22), below PET resolution, all new since 01/25/2020 PET-CT, unchanged from 09/29/2021 chest CT. Incidental CT findings: Coronary atherosclerosis. Borderline mild cardiomegaly. Atherosclerotic thoracic aorta with dilated 4.2 cm ascending thoracic aorta. ABDOMEN/PELVIS: No abnormal hypermetabolic activity within the liver, pancreas, adrenal glands, or spleen. No hypermetabolic lymph nodes in the abdomen or pelvis. Incidental CT findings: Several hyperdense bilateral renal cortical lesions, largest 2.3 cm in the anterior lower left kidney (series 4/image 120), not substantially changed. Mild-to-moderate colonic diverticulosis. Hysterectomy. Atherosclerotic nonaneurysmal abdominal aorta. SKELETON: Several hypermetabolic lytic osseous metastasis to the bilateral ribs, thoracic spine and sacrum, all new since 01/25/2020 PET-CT. Representative lesions as follows: -right sacral ala 3.6 cm lesion with max SUV 6.1 (series 4/image 142) -right T9/T10 vertebral and costovertebral junction lesion with max SUV 6.3 (series 4/image 79) -lateral right fifth rib 1.2 cm  lesion with max  SUV 3.9 (series 4/image 62) -posterior left fourth rib 1.4 cm lesion with max SUV 5.2 (series 4/image 53) Incidental CT findings: none IMPRESSION: 1. Several hypermetabolic lytic osseous metastases to the bilateral ribs, thoracic spine and sacrum, all new since 01/25/2020 PET-CT. 2. Treated index pulmonary nodule in the left upper lobe is significantly decreased in size and metabolism compared to baseline 01/25/2020 PET-CT. 3. Numerous additional small solid bilateral pulmonary nodules scattered throughout both lungs, largest 0.5 cm in the right upper lobe, below PET resolution, all new since baseline 01/25/2020 PET-CT, unchanged from 09/29/2021 chest CT, suspicious for small bilateral pulmonary metastases. 4. Chronic findings include: Aortic Atherosclerosis (ICD10-I70.0). Coronary atherosclerosis. Borderline mild cardiomegaly. Dilated 4.2 cm ascending thoracic aorta. Mild-to-moderate colonic diverticulosis. Electronically Signed   By: Ilona Sorrel M.D.   On: 10/20/2021 08:16    Labs:  CBC: Recent Labs    04/14/21 1112 08/27/21 1343  WBC 6.0 6.5  HGB 12.2 12.9  HCT 36.7 39.0  PLT 229 193    COAGS: No results for input(s): INR, APTT in the last 8760 hours.  BMP: Recent Labs    03/12/21 1020 04/14/21 1112 06/30/21 1429 08/27/21 1343  NA 145* 141 144 141  K 4.5 3.8 4.4 4.7  CL 100 98 103 101  CO2 28 29 32* 32  GLUCOSE 135* 99 95 154*  BUN 22 19 21 20   CALCIUM 10.4* 9.7 10.1 10.2  CREATININE 1.44* 1.35* 1.28* 1.49*    LIVER FUNCTION TESTS: Recent Labs    03/12/21 1020 08/27/21 1343  BILITOT 0.2 0.5  AST 18 20  ALT 15 12  ALKPHOS 98  --   PROT 6.6 6.8  ALBUMIN 4.2  --     TUMOR MARKERS: No results for input(s): AFPTM, CEA, CA199, CHROMGRNA in the last 8760 hours.  Assessment and Plan:  ***  Thank you for this interesting consult.  I greatly enjoyed meeting Shannon [name] and look forward to participating in their care.  A copy of this report was sent  to the requesting provider on this date.  Electronically Signed: Pasty Spillers, PA 10/29/2021, 10:29 AM   I spent a total of {New JKDT:[id number]} {New Out-Pt:[id number]}  {Established Out-Pt:[id number]} in face to face in clinical consultation, greater than 50% of which was counseling/coordinating care for ***

## 2021-10-30 ENCOUNTER — Ambulatory Visit (HOSPITAL_COMMUNITY): Admission: RE | Admit: 2021-10-30 | Payer: Medicare Other | Source: Ambulatory Visit

## 2021-10-30 ENCOUNTER — Ambulatory Visit (HOSPITAL_COMMUNITY): Payer: Medicare Other

## 2021-11-03 ENCOUNTER — Other Ambulatory Visit: Payer: Self-pay | Admitting: Internal Medicine

## 2021-11-03 ENCOUNTER — Ambulatory Visit (HOSPITAL_COMMUNITY)
Admission: RE | Admit: 2021-11-03 | Discharge: 2021-11-03 | Disposition: A | Discharge status: Home / Self Care | Payer: Medicare Other | Source: Ambulatory Visit | Attending: Internal Medicine | Admitting: Internal Medicine

## 2021-11-03 ENCOUNTER — Other Ambulatory Visit: Payer: Self-pay

## 2021-11-03 DIAGNOSIS — C3490 Malignant neoplasm of unspecified part of unspecified bronchus or lung: Secondary | ICD-10-CM

## 2021-11-03 DIAGNOSIS — G9389 Other specified disorders of brain: Secondary | ICD-10-CM | POA: Diagnosis not present

## 2021-11-06 ENCOUNTER — Inpatient Hospital Stay: Payer: Medicare Other

## 2021-11-06 ENCOUNTER — Other Ambulatory Visit: Payer: Self-pay | Admitting: Radiology

## 2021-11-06 ENCOUNTER — Other Ambulatory Visit: Payer: Self-pay

## 2021-11-06 ENCOUNTER — Inpatient Hospital Stay (HOSPITAL_BASED_OUTPATIENT_CLINIC_OR_DEPARTMENT_OTHER): Payer: Medicare Other | Admitting: Internal Medicine

## 2021-11-06 VITALS — BP 168/93 | HR 79 | Temp 97.0°F | Resp 19 | Ht 67.0 in | Wt 234.7 lb

## 2021-11-06 DIAGNOSIS — R918 Other nonspecific abnormal finding of lung field: Secondary | ICD-10-CM | POA: Diagnosis not present

## 2021-11-06 DIAGNOSIS — C3490 Malignant neoplasm of unspecified part of unspecified bronchus or lung: Secondary | ICD-10-CM | POA: Diagnosis not present

## 2021-11-06 DIAGNOSIS — G4700 Insomnia, unspecified: Secondary | ICD-10-CM | POA: Insufficient documentation

## 2021-11-06 DIAGNOSIS — R0789 Other chest pain: Secondary | ICD-10-CM | POA: Diagnosis not present

## 2021-11-06 DIAGNOSIS — I10 Essential (primary) hypertension: Secondary | ICD-10-CM | POA: Insufficient documentation

## 2021-11-06 DIAGNOSIS — M545 Low back pain, unspecified: Secondary | ICD-10-CM | POA: Insufficient documentation

## 2021-11-06 DIAGNOSIS — C7951 Secondary malignant neoplasm of bone: Secondary | ICD-10-CM | POA: Diagnosis not present

## 2021-11-06 DIAGNOSIS — R5383 Other fatigue: Secondary | ICD-10-CM | POA: Insufficient documentation

## 2021-11-06 LAB — CMP (CANCER CENTER ONLY)
ALT: 36 U/L (ref 0–44)
AST: 23 U/L (ref 15–41)
Albumin: 3.7 g/dL (ref 3.5–5.0)
Alkaline Phosphatase: 102 U/L (ref 38–126)
Anion gap: 3 — ABNORMAL LOW (ref 5–15)
BUN: 30 mg/dL — ABNORMAL HIGH (ref 8–23)
CO2: 36 mmol/L — ABNORMAL HIGH (ref 22–32)
Calcium: 9.9 mg/dL (ref 8.9–10.3)
Chloride: 98 mmol/L (ref 98–111)
Creatinine: 1.15 mg/dL — ABNORMAL HIGH (ref 0.44–1.00)
GFR, Estimated: 52 mL/min — ABNORMAL LOW (ref >60)
Glucose, Bld: 119 mg/dL — ABNORMAL HIGH (ref 70–99)
Potassium: 4.5 mmol/L (ref 3.5–5.1)
Sodium: 137 mmol/L (ref 135–145)
Total Bilirubin: 0.5 mg/dL (ref 0.3–1.2)
Total Protein: 6.2 g/dL — ABNORMAL LOW (ref 6.5–8.1)

## 2021-11-06 LAB — CBC WITH DIFFERENTIAL (CANCER CENTER ONLY)
Abs Immature Granulocytes: 0.03 10*3/uL (ref 0.00–0.07)
Basophils Absolute: 0 10*3/uL (ref 0.0–0.1)
Basophils Relative: 0 %
Eosinophils Absolute: 0 10*3/uL (ref 0.0–0.5)
Eosinophils Relative: 0 %
HCT: 40.6 % (ref 36.0–46.0)
Hemoglobin: 12.8 g/dL (ref 12.0–15.0)
Immature Granulocytes: 1 %
Lymphocytes Relative: 3 %
Lymphs Abs: 0.2 10*3/uL — ABNORMAL LOW (ref 0.7–4.0)
MCH: 30.6 pg (ref 26.0–34.0)
MCHC: 31.5 g/dL (ref 30.0–36.0)
MCV: 97.1 fL (ref 80.0–100.0)
Monocytes Absolute: 0.3 10*3/uL (ref 0.1–1.0)
Monocytes Relative: 4 %
Neutro Abs: 6.1 10*3/uL (ref 1.7–7.7)
Neutrophils Relative %: 92 %
Platelet Count: 145 10*3/uL — ABNORMAL LOW (ref 150–400)
RBC: 4.18 MIL/uL (ref 3.87–5.11)
RDW: 14.1 % (ref 11.5–15.5)
WBC Count: 6.6 10*3/uL (ref 4.0–10.5)
nRBC: 0 % (ref 0.0–0.2)

## 2021-11-06 MED ORDER — TEMAZEPAM 15 MG PO CAPS: 15.0000 mg | ORAL_CAPSULE | Freq: Every evening | ORAL | 0 refills | Status: DC | PRN

## 2021-11-06 NOTE — Progress Notes (Signed)
?    [facility] ?[phone]   [phone] ? ?OFFICE PROGRESS NOTE ? ?[name], MD ?[address] ?[place] [address] ? ?DIAGNOSIS: Recurrent and metastatic non-small cell lung cancer initially diagnosed as stage IA (T1c, N0, M0) non-small cell lung cancer, adenocarcinoma presented with left upper lobe pulmonary nodule diagnosed in June 2021.  The patient has evidence for disease recurrence in February 2023. ? ?PRIOR THERAPY: Curative radiotherapy to left upper lobe lung nodules under the care of Dr. Sondra Come completed April 22, 2020. ? ?CURRENT THERAPY: None ? ?INTERVAL HISTORY: ?Shannon [name] 66 y.o. female returns to the clinic today for follow-up visit.  The patient continues to complain of swelling in the lower extremities as well as insomnia and fatigue.  She canceled her CT-guided biopsy of the bone lesion because she was not feeling good.  It is rescheduled to be done next week.  She had MRI of the brain performed recently.  She denied having any current chest pain but has shortness of breath at baseline increased with exertion with mild cough and no hemoptysis.  She has occasional nausea but she does not take her antiemetics.  She has no current vomiting, diarrhea or constipation.  She is here today for evaluation and discussion of her MRI results. ? ? ? ?MEDICAL HISTORY: ?Past Medical History:  ?Diagnosis Date  ? Anemia   ? Anxiety   ? HX PANIC ATTACKS  ? Arthritis   ? "starting to; in my hands" (07/09/2015)  ? Asthma   ? Atrial fibrillation ([place])   ? Atrial flutter, paroxysmal ([place])   ? Bloated abdomen   ? CFS (chronic fatigue syndrome)   ? Chewing difficulty   ? Chronic asthma with acute exacerbation   ? "I have chronic asthma all the time; sometimes exacerbations" (07/09/2015)  ? Chronic lower back pain   ? COPD (chronic obstructive pulmonary disease) ([place])   ? Cyst of right kidney   ? "3 of them; dx'd in ~ 01/2015"  ? Dyspnea   ? GERD (gastroesophageal reflux  disease)   ? Heart murmur   ? History of blood transfusion   ? "related to my brain surgery I think"  ? History of pulmonary embolism 07/09/2015  ? History of radiation therapy 04/09/20-04/22/20  ? SBRT Left Lung, Dr. Gery Pray  ? HIV antibody positive ([place])   ? HIV disease ([place])   ? Hyperlipidemia   ? Hypertension   ? Leg edema   ? Lipodystrophy   ? Mild CAD 2013  ? Multiple thyroid nodules   ? Osteopenia   ? Palpitations   ? Pneumonia 07/09/2015  ? Shingles   ? Vitamin B 12 deficiency   ? Vitamin D deficiency   ? ? ?ALLERGIES:  is allergic to lisinopril, tree extract, augmentin [amoxicillin-pot clavulanate], and ciprofloxacin. ? ?MEDICATIONS:  ?Current Outpatient Medications  ?Medication Sig Dispense Refill  ? A&D OINT Apply 1 application. topically daily as needed (rash).    ? acetaminophen (TYLENOL) 500 MG tablet Take 2 tablets (1,000 mg total) by mouth every 6 (six) hours as needed. (Patient taking differently: Take 500 mg by mouth every 6 (six) hours as needed.) 100 tablet 2  ? albuterol (PROVENTIL) (2.5 MG/3ML) 0.083% nebulizer solution Take 3 mLs (2.5 mg total) by nebulization every 4 (four) hours as needed for wheezing or shortness of breath. 75 mL 2  ? albuterol (VENTOLIN HFA) 108 (90 Base) MCG/ACT inhaler Inhale 2 puffs into the lungs  every 6 (six) hours as needed for wheezing or shortness of breath.    ? Ascorbic Acid (VITAMIN C) 1000 MG tablet Take 1,000 mg by mouth every other day.     ? b complex vitamins tablet Take 1 tablet by mouth daily.    ? BIKTARVY 50-200-25 MG TABS tablet TAKE 1 TABLET BY MOUTH DAILY 30 tablet 5  ? BIOTIN PO Take 1 tablet by mouth every other day.    ? Calcium Citrate-Vitamin D (CALCIUM + D PO) Take 1 tablet by mouth every other day.    ? CALCIUM PO Take 1 tablet by mouth daily.    ? Cholecalciferol (SM VITAMIN D3) 100 MCG (4000 UT) CAPS Take 1 capsule (4,000 Units total) by mouth daily. (Patient not taking: Reported on 11/05/2021) 30 capsule 0  ? cyanocobalamin (,VITAMIN  B-12,) 1000 MCG/ML injection ADMINISTER 1 ML(1000 MCG) IN THE MUSCLE EVERY 30 DAYS 3 mL 1  ? cyclobenzaprine (FLEXERIL) 5 MG tablet Take 0.5-1 tablets (2.5-5 mg total) by mouth as needed for muscle spasms. (Patient not taking: Reported on 11/05/2021) 5 tablet 0  ? dexamethasone (DECADRON) 4 MG tablet Take 1 tablet (4 mg total) by mouth 2 (two) times daily with a meal. 30 tablet 0  ? diltiazem (CARDIZEM CD) 240 MG 24 hr capsule TAKE 1 CAPSULE(240 MG) BY MOUTH DAILY 90 capsule 3  ? famotidine (PEPCID) 40 MG tablet Take 1 tablet (40 mg total) by mouth daily. 30 tablet 11  ? fluticasone furoate-vilanterol (BREO ELLIPTA) 100-25 MCG/ACT AEPB Inhale 1 puff into the lungs daily. (Patient not taking: Reported on 11/05/2021) 28 each 2  ? furosemide (LASIX) 20 MG tablet TAKE 1 TABLET(20 MG) BY MOUTH DAILY AS NEEDED (Patient taking differently: Take 20 mg by mouth every other day.) 90 tablet 0  ? magnesium hydroxide (MILK OF MAGNESIA) 400 MG/5ML suspension Take 15 mLs by mouth daily as needed for mild constipation.    ? Melatonin 5 MG TABS Take 5 mg by mouth at bedtime as needed (for sleep).    ? Misc. Devices (PULSE OXIMETER FOR FINGER) MISC 1 Units by Does not apply route as needed. 1 each 0  ? morphine (MS CONTIN) 15 MG 12 hr tablet Take 1 tablet by mouth every 12 hours. 30 tablet 0  ? Mouthwashes (BIOTENE DRY MOUTH GENTLE) LIQD Use as directed 1 Dose in the mouth or throat 2 (two) times daily as needed (dry mouth).    ? Naphazoline HCl (CLEAR EYES OP) Place 1 drop into both eyes daily as needed (redness).    ? neomycin-bacitracin-polymyxin (NEOSPORIN) ointment Apply 1 application. topically as needed for wound care.    ? Olopatadine HCl (PATADAY OP) Place 1 drop into both eyes daily as needed (burning).    ? ondansetron (ZOFRAN) 4 MG tablet Take 1 tablet (4 mg total) by mouth every 8 (eight) hours as needed for nausea or vomiting. (Patient not taking: Reported on 11/05/2021) 20 tablet 0  ? Oxycodone HCl 10 MG TABS Take 10 mg  by mouth every 6 (six) hours as needed (breakthrough pain).    ? OXYGEN Inhale 1.5-2 L into the lungs See admin instructions. Uses at bedtime and through the day as needed    ? Phenylephrine-Cocoa Butter (PREPARATION H RE) Place 1 application. rectally daily as needed (hemorrhoids).    ? Polyethyl Glycol-Propyl Glycol (SYSTANE OP) Place 1 drop into both eyes daily as needed (dry eyes).    ? spironolactone (ALDACTONE) 25 MG tablet Take 1 tablet (  25 mg total) by mouth daily. (Patient not taking: Reported on 11/05/2021) 90 tablet 0  ? SYRINGE/NEEDLE, DISP, 1 ML (B-D SYRINGE/NEEDLE 1CC/25GX5/8) 25G X 5/8" 1 ML MISC 1 Units by Does not apply route every 30 (thirty) days. 50 each 0  ? XARELTO 20 MG TABS tablet TAKE 1 TABLET(20 MG) BY MOUTH DAILY (Patient not taking: Reported on 11/05/2021) 90 tablet 0  ? ?No current facility-administered medications for this visit.  ? ? ?SURGICAL HISTORY:  ?Past Surgical History:  ?Procedure Laterality Date  ? ABDOMINAL HYSTERECTOMY    ? "robotic laparosopic"  ? BRAIN SURGERY  1974  ? "brain tumor; benign; on top of my brain; got a plate in there"  ? BRAIN SURGERY    ? age 27- -"Tumor pushing my skullout"  ? BRONCHIAL BIOPSY  02/13/2020  ? Procedure: BRONCHIAL BIOPSIES;  Surgeon: [name], MD;  Location: [facility] ENDOSCOPY;  Service: Pulmonary;;  ? BRONCHIAL BRUSHINGS  02/13/2020  ? Procedure: BRONCHIAL BRUSHINGS;  Surgeon: [name], MD;  Location[facility] ENDOSCOPY;  Service: Pulmonary;;  ? BRONCHIAL NEEDLE ASPIRATION BIOPSY  02/13/2020  ? Procedure: BRONCHIAL NEEDLE ASPIRATION BIOPSIES;  Surgeon: [name], MD;  Location: [facility] ENDOSCOPY;  Service: Pulmonary;;  ? CARDIAC CATHETERIZATION    ? FIDUCIAL MARKER PLACEMENT  02/13/2020  ? Procedure: FIDUCIAL MARKER PLACEMENT;  Surgeon: [name], MD;  Location[facility] ENDOSCOPY;  Service: Pulmonary;;  ? TONSILLECTOMY AND ADENOIDECTOMY    ? VIDEO BRONCHOSCOPY WITH ENDOBRONCHIAL NAVIGATION N/A 02/13/2020  ? Procedure: VIDEO BRONCHOSCOPY WITH  ENDOBRONCHIAL NAVIGATION;  Surgeon: [name], MD;  Location: [facility] ENDOSCOPY;  Service: Pulmonary;  Laterality: N/A;  ? ? ?REVIEW OF SYSTEMS:  Constitutional: positive for fatigue ?Eyes: negative ?Ears

## 2021-11-07 ENCOUNTER — Other Ambulatory Visit: Payer: Self-pay

## 2021-11-07 ENCOUNTER — Ambulatory Visit (INDEPENDENT_AMBULATORY_CARE_PROVIDER_SITE_OTHER): Payer: Medicare Other | Admitting: Internal Medicine

## 2021-11-07 DIAGNOSIS — J449 Chronic obstructive pulmonary disease, unspecified: Secondary | ICD-10-CM

## 2021-11-07 DIAGNOSIS — Z7901 Long term (current) use of anticoagulants: Secondary | ICD-10-CM

## 2021-11-07 DIAGNOSIS — C7951 Secondary malignant neoplasm of bone: Secondary | ICD-10-CM | POA: Diagnosis not present

## 2021-11-07 MED ORDER — ALBUTEROL SULFATE HFA 108 (90 BASE) MCG/ACT IN AERS: 2.0000 | INHALATION_SPRAY | Freq: Four times a day (QID) | RESPIRATORY_TRACT | 11 refills | Status: DC | PRN

## 2021-11-07 NOTE — Progress Notes (Signed)
?  [facility] Telephone Encounter ?Continuity Care Appointment ? ?HPI:  ?This telephone encounter was created for [name] on 11/07/2021 for the following purpose/cc follow up for her metastatic cancer and other issues.  She notes feeling intensely fatigued, tired, doesn't feel like getting out of bed.  She has been having worsening fatigue in the morning.   ?She notes that the radiation made her so sick, but she is done with that now.  ?She notes that she is in pain too from the areas of metastatic disease.  She saw Dr. Earlie Server and reports feeling upset at the discussion of hospice, palliative care when "they don't even know what it is."  ?Sx include SOB, exhaustion.   ?Most of our conversation was around her diagnosis of cancer and her coming to terms with her situation.   ?She notes that her hospital bed has arrived and she feels that it is a Therapist, sports for her.    ? ?Past Medical History:  ?Past Medical History:  ?Diagnosis Date  ? Anemia   ? Anxiety   ? HX PANIC ATTACKS  ? Arthritis   ? "starting to; in my hands" (07/09/2015)  ? Asthma   ? Atrial fibrillation ([place])   ? Atrial flutter, paroxysmal ([place])   ? Bloated abdomen   ? CFS (chronic fatigue syndrome)   ? Chewing difficulty   ? Chronic asthma with acute exacerbation   ? "I have chronic asthma all the time; sometimes exacerbations" (07/09/2015)  ? Chronic lower back pain   ? COPD (chronic obstructive pulmonary disease) ([place])   ? Cyst of right kidney   ? "3 of them; dx'd in ~ 01/2015"  ? Dyspnea   ? GERD (gastroesophageal reflux disease)   ? Heart murmur   ? History of blood transfusion   ? "related to my brain surgery I think"  ? History of pulmonary embolism 07/09/2015  ? History of radiation therapy 04/09/20-04/22/20  ? SBRT Left Lung, Dr. Gery Pray  ? HIV antibody positive ([place])   ? HIV disease ([place])   ? Hyperlipidemia   ? Hypertension   ? Leg edema   ? Lipodystrophy   ? Mild CAD 2013  ? Multiple thyroid nodules   ?  Osteopenia   ? Palpitations   ? Pneumonia 07/09/2015  ? Shingles   ? Vitamin B 12 deficiency   ? Vitamin D deficiency   ?  ? ?ROS:  ?+ for Neck and back pain, fatigue, loss of appetite  ? ?Assessment / Plan / Recommendations:  ?Please see A&P under problem oriented charting for assessment of the patient's acute and chronic medical conditions.  ?As always, pt is advised that if symptoms worsen or new symptoms arise, they should go to an urgent care facility or to to ER for further evaluation.  ? ?Consent and Medical Decision Making:  ?This is a telephone encounter between Shannon [name] and Gilles Chiquito on 11/07/2021 for follow up after hospital bed. The visit was conducted with the patient located at home and Gilles Chiquito at [facility]. The patient's identity was confirmed using their DOB and name. The patient has consented to being evaluated through a telephone encounter and understands the associated risks (an examination cannot be done and the patient may need to come in for an appointment) / benefits (allows the patient to remain at home, decreasing exposure to coronavirus). I personally spent 30 minutes on medical discussion.   ?Problem oriented charting for plan.  ? ?

## 2021-11-07 NOTE — Assessment & Plan Note (Signed)
She reported to Dr. Lew Dawes office that she stopped taking her Xarelto.  I will plan to follow up with her about this at next visit.  ?

## 2021-11-07 NOTE — Assessment & Plan Note (Signed)
She notes that her breath is short all the time.  She is out of her albuterol.  She feels her breath is short due to the cancer and how fatigued she is.  She was speaking in full sentences with occasional pauses and no wheezing during our conversation.  ? ?Refill albuterol inhaler today.  ?

## 2021-11-07 NOTE — Assessment & Plan Note (Signed)
She is due for a CT guided biopsy of the bone.  She had to defer the initial biopsy due to severe fatigue.  This will be the pivot point on deciding what the next step in treatment is for her.  We discussed what it could be and what it likely is not.  She is open to treatment and would like to discuss immunotherapy if this is an option for her.  She has good insight into her disease at this time.  She wants to fight the disease and is not interested in hospice currently.  She has currently completed her 3 weeks of radiation for painful mets.  ? ?CT guided biopsy planned for 3/20.  Follow up with Dr. Sondra Come and Dr. Earlie Server after that.  ?

## 2021-11-10 ENCOUNTER — Ambulatory Visit (HOSPITAL_COMMUNITY)
Admission: RE | Admit: 2021-11-10 | Discharge: 2021-11-10 | Disposition: A | Discharge status: Home / Self Care | Payer: Medicare Other | Source: Ambulatory Visit | Attending: Internal Medicine | Admitting: Internal Medicine

## 2021-11-10 ENCOUNTER — Telehealth: Payer: Self-pay | Admitting: *Deleted

## 2021-11-10 ENCOUNTER — Emergency Department (HOSPITAL_COMMUNITY)
Admission: EM | Admit: 2021-11-10 | Discharge: 2021-11-10 | Discharge status: Against Medical Advice | Payer: Medicare Other | Attending: Internal Medicine | Admitting: Internal Medicine

## 2021-11-10 ENCOUNTER — Encounter: Payer: Medicare Other | Admitting: Internal Medicine

## 2021-11-10 ENCOUNTER — Other Ambulatory Visit: Payer: Self-pay

## 2021-11-10 ENCOUNTER — Encounter (HOSPITAL_COMMUNITY): Payer: Self-pay | Admitting: Emergency Medicine

## 2021-11-10 ENCOUNTER — Emergency Department (HOSPITAL_COMMUNITY): Payer: Medicare Other

## 2021-11-10 ENCOUNTER — Encounter (HOSPITAL_COMMUNITY): Payer: Self-pay

## 2021-11-10 ENCOUNTER — Other Ambulatory Visit: Payer: Self-pay | Admitting: Radiology

## 2021-11-10 DIAGNOSIS — C3490 Malignant neoplasm of unspecified part of unspecified bronchus or lung: Secondary | ICD-10-CM | POA: Insufficient documentation

## 2021-11-10 DIAGNOSIS — Z5321 Procedure and treatment not carried out due to patient leaving prior to being seen by health care provider: Secondary | ICD-10-CM | POA: Insufficient documentation

## 2021-11-10 DIAGNOSIS — I10 Essential (primary) hypertension: Secondary | ICD-10-CM | POA: Diagnosis not present

## 2021-11-10 DIAGNOSIS — Z85118 Personal history of other malignant neoplasm of bronchus and lung: Secondary | ICD-10-CM | POA: Diagnosis not present

## 2021-11-10 DIAGNOSIS — R0602 Shortness of breath: Secondary | ICD-10-CM | POA: Insufficient documentation

## 2021-11-10 DIAGNOSIS — R059 Cough, unspecified: Secondary | ICD-10-CM | POA: Diagnosis not present

## 2021-11-10 DIAGNOSIS — J45909 Unspecified asthma, uncomplicated: Secondary | ICD-10-CM | POA: Insufficient documentation

## 2021-11-10 DIAGNOSIS — I517 Cardiomegaly: Secondary | ICD-10-CM | POA: Diagnosis not present

## 2021-11-10 LAB — CBC
HCT: 40.5 % (ref 36.0–46.0)
Hemoglobin: 13.3 g/dL (ref 12.0–15.0)
MCH: 31.6 pg (ref 26.0–34.0)
MCHC: 32.8 g/dL (ref 30.0–36.0)
MCV: 96.2 fL (ref 80.0–100.0)
Platelets: 123 10*3/uL — ABNORMAL LOW (ref 150–400)
RBC: 4.21 MIL/uL (ref 3.87–5.11)
RDW: 14.1 % (ref 11.5–15.5)
WBC: 7.9 10*3/uL (ref 4.0–10.5)
nRBC: 0 % (ref 0.0–0.2)

## 2021-11-10 LAB — COMPREHENSIVE METABOLIC PANEL
ALT: 34 U/L (ref 0–44)
AST: 27 U/L (ref 15–41)
Albumin: 3.3 g/dL — ABNORMAL LOW (ref 3.5–5.0)
Alkaline Phosphatase: 95 U/L (ref 38–126)
Anion gap: 8 (ref 5–15)
BUN: 22 mg/dL (ref 8–23)
CO2: 31 mmol/L (ref 22–32)
Calcium: 9.4 mg/dL (ref 8.9–10.3)
Chloride: 99 mmol/L (ref 98–111)
Creatinine, Ser: 1.2 mg/dL — ABNORMAL HIGH (ref 0.44–1.00)
GFR, Estimated: 50 mL/min — ABNORMAL LOW (ref >60)
Glucose, Bld: 92 mg/dL (ref 70–99)
Potassium: 3.7 mmol/L (ref 3.5–5.1)
Sodium: 138 mmol/L (ref 135–145)
Total Bilirubin: 0.8 mg/dL (ref 0.3–1.2)
Total Protein: 5.6 g/dL — ABNORMAL LOW (ref 6.5–8.1)

## 2021-11-10 LAB — CBC WITH DIFFERENTIAL/PLATELET
Abs Immature Granulocytes: 0.03 10*3/uL (ref 0.00–0.07)
Basophils Absolute: 0 10*3/uL (ref 0.0–0.1)
Basophils Relative: 0 %
Eosinophils Absolute: 0.1 10*3/uL (ref 0.0–0.5)
Eosinophils Relative: 1 %
HCT: 40.6 % (ref 36.0–46.0)
Hemoglobin: 13.2 g/dL (ref 12.0–15.0)
Immature Granulocytes: 0 %
Lymphocytes Relative: 7 %
Lymphs Abs: 0.5 10*3/uL — ABNORMAL LOW (ref 0.7–4.0)
MCH: 32 pg (ref 26.0–34.0)
MCHC: 32.5 g/dL (ref 30.0–36.0)
MCV: 98.5 fL (ref 80.0–100.0)
Monocytes Absolute: 0.5 10*3/uL (ref 0.1–1.0)
Monocytes Relative: 7 %
Neutro Abs: 6.3 10*3/uL (ref 1.7–7.7)
Neutrophils Relative %: 85 %
Platelets: 111 10*3/uL — ABNORMAL LOW (ref 150–400)
RBC: 4.12 MIL/uL (ref 3.87–5.11)
RDW: 14 % (ref 11.5–15.5)
WBC: 7.5 10*3/uL (ref 4.0–10.5)
nRBC: 0 % (ref 0.0–0.2)

## 2021-11-10 LAB — PROTIME-INR
INR: 1 (ref 0.8–1.2)
Prothrombin Time: 13.1 seconds (ref 11.4–15.2)

## 2021-11-10 MED ORDER — BICTEGRAVIR-EMTRICITAB-TENOFOV 50-200-25 MG PO TABS
1.0000 | ORAL_TABLET | Freq: Every day | ORAL | Status: DC
  Filled 2021-11-10: qty 1

## 2021-11-10 MED ORDER — LIDOCAINE HCL 1 % IJ SOLN
INTRAMUSCULAR | Status: AC
  Filled 2021-11-10: qty 10

## 2021-11-10 MED ORDER — SODIUM CHLORIDE 0.9 % IV SOLN: INTRAVENOUS | Status: DC

## 2021-11-10 NOTE — Telephone Encounter (Signed)
Received call from Procedure area upstairs. Patient is there at present for bx of sacral lesion, however, patient is having SHOB and BP 190/140. Appt given for 0945 with [facility] Provider. ?

## 2021-11-10 NOTE — Progress Notes (Signed)
Patient procedure cancelled and Per Jannifer Franklin leave IV in in case needed when patient is seen in clinic. Patient take to clinic via Nt.  ?

## 2021-11-10 NOTE — H&P (Signed)
? ?Chief Complaint: ?Patient was seen in consultation today for  right sacral lesion biopsy at the request of Mohamed,Mohamed ? ?Referring Physician(s): ?Mohamed,Mohamed ? ?Supervising Physician: Juliet Rude ? ?Patient Status: [facility] - Out-pt ? ?History of Present Illness: ?Shannon [name] is a 66 y.o. female  ? ?Known Hx NSCLC ?Treated 2021 ?Complains of leg swelling; fatigue; insomnia ?Was scheduled sacral bone lesion few weeks ago and pt cancelled-- secondary not feeling well ?Has rescheduled to today ? ?Pt is SOB and BP today high at 193/142 and 157/124 ?Pt not on BP meds per MAR ?Denies chest pain ?Has not taken Xarelto in a few months per pt ? ? ?PET:  10/16/21: IMPRESSION: ?1. Several hypermetabolic lytic osseous metastases to the bilateral ?ribs, thoracic spine and sacrum, all new since 01/25/2020 PET-CT. ?2. Treated index pulmonary nodule in the left upper lobe is ?significantly decreased in size and metabolism compared to baseline ?01/25/2020 PET-CT. ?3. Numerous additional small solid bilateral pulmonary nodules ?scattered throughout both lungs, largest 0.5 cm in the right upper ?lobe, below PET resolution, all new since baseline 01/25/2020 ?PET-CT, unchanged from 09/29/2021 chest CT, suspicious for small ?bilateral pulmonary metastases. ? ?MRI Brain 11/03/21:  ?IMPRESSION: ?1. Expansile bony metastasis at the right skull base/clivus. ?2. The patient declined IV contrast. No evidence of brain metastases ?in the absence of contrast. ?3. Stable postoperative changes to the right calvarium and chronic ?small vessel disease since a 2017 MRI. ? ?Scheduled today for sacral lesion biopsy today ? ? ?Past Medical History:  ?Diagnosis Date  ? Anemia   ? Anxiety   ? HX PANIC ATTACKS  ? Arthritis   ? "starting to; in my hands" (07/09/2015)  ? Asthma   ? Atrial fibrillation ([place])   ? Atrial flutter, paroxysmal ([place])   ? Bloated abdomen   ? CFS (chronic fatigue syndrome)   ? Chewing difficulty   ? Chronic asthma  with acute exacerbation   ? "I have chronic asthma all the time; sometimes exacerbations" (07/09/2015)  ? Chronic lower back pain   ? COPD (chronic obstructive pulmonary disease) ([place])   ? Cyst of right kidney   ? "3 of them; dx'd in ~ 01/2015"  ? Dyspnea   ? GERD (gastroesophageal reflux disease)   ? Heart murmur   ? History of blood transfusion   ? "related to my brain surgery I think"  ? History of pulmonary embolism 07/09/2015  ? History of radiation therapy 04/09/20-04/22/20  ? SBRT Left Lung, Dr. Gery Pray  ? HIV antibody positive ([place])   ? HIV disease ([place])   ? Hyperlipidemia   ? Hypertension   ? Leg edema   ? Lipodystrophy   ? Mild CAD 2013  ? Multiple thyroid nodules   ? Osteopenia   ? Palpitations   ? Pneumonia 07/09/2015  ? Shingles   ? Vitamin B 12 deficiency   ? Vitamin D deficiency   ? ? ?Past Surgical History:  ?Procedure Laterality Date  ? ABDOMINAL HYSTERECTOMY    ? "robotic laparosopic"  ? BRAIN SURGERY  1974  ? "brain tumor; benign; on top of my brain; got a plate in there"  ? BRAIN SURGERY    ? age 63- -"Tumor pushing my skullout"  ? BRONCHIAL BIOPSY  02/13/2020  ? Procedure: BRONCHIAL BIOPSIES;  Surgeon: [name], MD;  Location: [facility] ENDOSCOPY;  Service: Pulmonary;;  ? BRONCHIAL BRUSHINGS  02/13/2020  ? Procedure: BRONCHIAL BRUSHINGS;  Surgeon: [name], MD;  Location: [facility] ENDOSCOPY;  Service: Pulmonary;;  ? BRONCHIAL NEEDLE ASPIRATION BIOPSY  02/13/2020  ? Procedure: BRONCHIAL NEEDLE ASPIRATION BIOPSIES;  Surgeon: [name], MD;  Location: [facility] ENDOSCOPY;  Service: Pulmonary;;  ? CARDIAC CATHETERIZATION    ? FIDUCIAL MARKER PLACEMENT  02/13/2020  ? Procedure: FIDUCIAL MARKER PLACEMENT;  Surgeon: [name], MD;  Location: [facility] ENDOSCOPY;  Service: Pulmonary;;  ? TONSILLECTOMY AND ADENOIDECTOMY    ? VIDEO BRONCHOSCOPY WITH ENDOBRONCHIAL NAVIGATION N/A 02/13/2020  ? Procedure: VIDEO BRONCHOSCOPY WITH ENDOBRONCHIAL NAVIGATION;  Surgeon: [name], MD;  Location[facility] ENDOSCOPY;   Service: Pulmonary;  Laterality: N/A;  ? ? ?Allergies: ?Lisinopril, Tree extract, Augmentin [amoxicillin-pot clavulanate], and Ciprofloxacin ? ?Medications: ?Prior to Admission medications   ?Medication Sig Start Date End Date Taking? Authorizing Provider  ?A&D OINT Apply 1 application. topically daily as needed (rash).   Yes [provider]  ?albuterol (PROVENTIL) (2.5 MG/3ML) 0.083% nebulizer solution Take 3 mLs (2.5 mg total) by nebulization every 4 (four) hours as needed for wheezing or shortness of breath. 04/16/21 04/16/22 Yes Madalyn Rob, MD  ?albuterol (VENTOLIN HFA) 108 (90 Base) MCG/ACT inhaler Inhale 2 puffs into the lungs every 6 (six) hours as needed for wheezing or shortness of breath. 11/07/21  Yes [name], MD  ?Ascorbic Acid (VITAMIN C) 1000 MG tablet Take 1,000 mg by mouth every other day.    Yes [provider]  ?b complex vitamins tablet Take 1 tablet by mouth daily.   Yes [provider]  ?BIKTARVY 50-200-25 MG TABS tablet TAKE 1 TABLET BY MOUTH DAILY 09/01/21  Yes [name], MD  ?BIOTIN PO Take 1 tablet by mouth every other day.   Yes [provider]  ?Calcium Citrate-Vitamin D (CALCIUM + D PO) Take 1 tablet by mouth every other day.   Yes [provider]  ?CALCIUM PO Take 1 tablet by mouth daily.   Yes [provider]  ?cyanocobalamin (,VITAMIN B-12,) 1000 MCG/ML injection ADMINISTER 1 ML(1000 MCG) IN THE MUSCLE EVERY 30 DAYS 10/14/21  Yes [name], MD  ?dexamethasone (DECADRON) 4 MG tablet Take 1 tablet (4 mg total) by mouth 2 (two) times daily with a meal. 10/15/21  Yes Gery Pray, MD  ?diltiazem (CARDIZEM CD) 240 MG 24 hr capsule TAKE 1 CAPSULE(240 MG) BY MOUTH DAILY 07/12/21  Yes [name], MD  ?furosemide (LASIX) 20 MG tablet TAKE 1 TABLET(20 MG) BY MOUTH DAILY AS NEEDED ?Patient taking differently: Take 20 mg by mouth every other day. 07/12/21  Yes [name], MD  ?magnesium hydroxide (MILK OF MAGNESIA) 400  MG/5ML suspension Take 15 mLs by mouth daily as needed for mild constipation.   Yes [provider]  ?Melatonin 5 MG TABS Take 5 mg by mouth at bedtime as needed (for sleep).   Yes [provider]  ?morphine (MS CONTIN) 15 MG 12 hr tablet Take 1 tablet by mouth every 12 hours. 10/15/21  Yes Gery Pray, MD  ?Mouthwashes (BIOTENE DRY MOUTH GENTLE) LIQD Use as directed 1 Dose in the mouth or throat 2 (two) times daily as needed (dry mouth). 02/13/20  Yes [name], MD  ?Naphazoline HCl (CLEAR EYES OP) Place 1 drop into both eyes daily as needed (redness).   Yes [provider]  ?neomycin-bacitracin-polymyxin (NEOSPORIN) ointment Apply 1 application. topically as needed for wound care.   Yes [provider]  ?Olopatadine HCl (PATADAY OP) Place 1 drop into both eyes daily as needed (burning).   Yes [provider]  ?Oxycodone HCl 10 MG TABS Take 10 mg by mouth every 6 (six) hours as needed (breakthrough pain). 09/30/21  Yes [provider]  ?Phenylephrine-Cocoa Butter (PREPARATION H RE) Place 1 application. rectally daily as needed (hemorrhoids).   Yes [provider]  ?Polyethyl Glycol-Propyl Glycol (SYSTANE OP) Place 1 drop into both eyes daily as needed (dry eyes).   Yes [provider]  ?acetaminophen (TYLENOL) 500 MG tablet Take 2 tablets (1,000 mg total) by mouth every 6 (six) hours as needed. ?Patient taking differently: Take 500 mg by mouth every 6 (six) hours as needed. 02/28/21 02/28/22  Riesa Pope, MD  ?famotidine (PEPCID) 40 MG tablet Take 1 tablet (40 mg total) by mouth daily. 09/04/21   Esterwood, Amy S, PA-C  ?Misc. Devices (PULSE OXIMETER FOR FINGER) MISC 1 Units by Does not apply route as needed. 01/19/19   Skeet Latch, MD  ?ondansetron (ZOFRAN) 4 MG tablet Take 1 tablet (4 mg total) by mouth every 8 (eight) hours as needed for nausea or vomiting. ?Patient not taking: Reported on 11/05/2021 10/15/21   Gery Pray, MD   ?OXYGEN Inhale 1.5-2 L into the lungs See admin instructions. Uses at bedtime and through the day as needed    [provider]  ?spironolactone (ALDACTONE) 25 MG tablet Take 1 tablet (25 mg t

## 2021-11-10 NOTE — ED Triage Notes (Signed)
Patient with history of hypertension brought to ED from procedural area of hospital for evaluation of hypertension. Patient stopped her antihypertensive medication yesterday for the procedure, patient states she believes hypertension today may be related to radiation treatment. Patient also complains of ongoing shortness of breath for several weeks. Patient is alert, oriented, wears 1.5L O2 [place] at baseline. ?

## 2021-11-10 NOTE — Progress Notes (Signed)
Jannifer Franklin PA aware of bP 157/124. ?

## 2021-11-10 NOTE — ED Notes (Signed)
Pt has left.  ?

## 2021-11-10 NOTE — Telephone Encounter (Signed)
Patient brought to [facility] via stretcher with O2 at 2 L via [place]. Patient with signs of increased work of breathing. Taken immediately to [facility] ED Triage room 56 after being registered in ED. Hand off given to [facility], RN in Triage. ?

## 2021-11-10 NOTE — ED Provider Triage Note (Signed)
Emergency Medicine Provider Triage Evaluation Note ? ?Shannon [name] , a 66 y.o. female  was evaluated in triage.  Pt complains of hypertension. Patient with a history of hypertension, asthma, lung cancer.  She was scheduled for a biopsy this morning but prior to this procedure she was found to be hypertensive.  She notes that for the past few days she has been feeling generally weak, concerned about a respiratory infection, vomiting, headache, back pain, body aches.  She attributes this to her radiation therapies.  Her last radiation therapy was a few weeks ago.  She did not take her antihypertensive this morning as she was told not to prior to her procedure. ? ?Review of Systems  ?Positive: Body aches, fatigue, weakness, cough, vomiting, headache ?Negative: Fever ? ?Physical Exam  ?BP (!) 161/115   Pulse (!) 106   Temp 99.2 ?F (37.3 ?C) (Oral)   Resp 18   SpO2 99%  ?Gen:   Awake, no distress   ?Resp:  Normal effort  ?MSK:   Moves extremities without difficulty  ?Other:  Patient tachycardic ? ?Medical Decision Making  ?Medically screening exam initiated at 10:32 AM.  Appropriate orders placed.  Shannon [name] was informed that the remainder of the evaluation will be completed by another provider, this initial triage assessment does not replace that evaluation, and the importance of remaining in the ED until their evaluation is complete. ? ?Labs ordered ?  ?Delia Heady, PA-C ?11/10/21 1038 ? ?

## 2021-11-11 ENCOUNTER — Ambulatory Visit: Payer: Self-pay

## 2021-11-11 ENCOUNTER — Telehealth: Payer: Self-pay

## 2021-11-11 NOTE — Telephone Encounter (Signed)
Pt has called back wanting to know why IR never follow-up up with her once they sent her to the ER. I advised the pt that her records indicate that the bx was not started due to elevated BP and SOB and her Internal Medicine office was contacted who then directed her to be taken to the ER. The pt states she was left in the ER with no further communication as to what was going on or what the plan was to get her back to IR for her procedure once they got her BP under control. Pt expressed understanding of this information and states the hand off process is broken and her experience as a pt was not a good one and feels IR should've facilitated this since she was there to see them. ?

## 2021-11-11 NOTE — Chronic Care Management (AMB) (Signed)
? ?  11/11/2021 ? ?Ocie Bob Burruss ?[birth year] ?[id number] ? ?Received notification from [facility] front desk that patient requested call back.  Successful outreach to patient who shared her experience yesterday when she went for her biopsy.  Per patient, she went to radiology and was suppose to have her biopsy, however, her blood pressure was too high so they could not do it.  Patient was going to be admitted to hospital and was brought to ED.  Patient had some labs drawn and was in the ED from 10:00 am- 5 PM and she was discharged as there was not an open bed for her. Patient shared that she resumed her medications today (she did not take yesterday as she was NPO).  Patient is going to follow up with her oncologist for plan to reschedule biopsy. ?[name], RN, BSN, CCM ?Care Management Coordinator ?[facility] ?Phone: [phone]/YVG: [phone]  ? ?

## 2021-11-11 NOTE — Telephone Encounter (Signed)
Message received from Rhys Martini, in Centralized scheduling that pt has been r/s for 11/20/21. ?

## 2021-11-11 NOTE — Telephone Encounter (Signed)
Pt called requesting assistance with getting her BM bx r/s. Pt does not want to have the bx at [facility] and wants to have it here at [facility]. ? ?I have advised the pt I will contact IR to determine what the next available date at [facility] is and get her r/s. ? ?I have left a message for IR requesting a call back. ?

## 2021-11-12 ENCOUNTER — Ambulatory Visit: Payer: Self-pay

## 2021-11-12 ENCOUNTER — Telehealth: Payer: Self-pay | Admitting: *Deleted

## 2021-11-12 ENCOUNTER — Other Ambulatory Visit: Payer: Self-pay | Admitting: Internal Medicine

## 2021-11-12 DIAGNOSIS — E538 Deficiency of other specified B group vitamins: Secondary | ICD-10-CM

## 2021-11-12 MED ORDER — DEXAMETHASONE 4 MG PO TABS: 4.0000 mg | ORAL_TABLET | Freq: Two times a day (BID) | ORAL | 0 refills | Status: DC

## 2021-11-12 MED ORDER — AMOXICILLIN 500 MG PO TABS: 500.0000 mg | ORAL_TABLET | Freq: Two times a day (BID) | ORAL | 0 refills | Status: AC

## 2021-11-12 NOTE — Chronic Care Management (AMB) (Signed)
? ?  11/12/2021 ? ?Shannon [name] ?1956/02/18 ?[id number] ? ?Incoming call from patient this morning.  She informed this RNCM that she rescheduled her biopsy for March 30 at [facility].  Patient is looking for additional help from a home health aide in the afternoon, after her procedure, as she currently has help 5x week from 9am-1PM. Encouraged patient to contact her oncologist to request note for additional services since they will be doing the procedure and will know what her status will be after the biopsy procedure.  We also discussed her pain medication regime and her blood pressure.  She noted this morning her blood pressure is 147/74.  Advised patient to be sure to discuss the timing of her medications when she has her pre-procedure call from IR nurse. ?Plan to call patient early next week to see if she needs any assistance. ?[name], RN, BSN, CCM ?Care Management Coordinator ?[facility] ?Phone: [phone]/CEY: [phone]  ? ?

## 2021-11-12 NOTE — Telephone Encounter (Signed)
I called Shannon [name] to discuss further.  She notes that she is very upset with the situation that happened on Monday.  ? ?She notes that she went to radiology at [facility].  She notes not being seen by any doctors, radiology or anesthesia.  She was getting ready to start the procedure when "someone" came in and said her blood pressure was too high and the doctor said that they wouldn't do it.  By this time, it is 830am.  They noted that they were waiting for a room in the ED from radiology.  She herself requested being brought down to the clinic.  She was evaluated by Triage nurses and was sent to the emergency room because of elevated blood pressure and SOB.  She notes that she was "in and out" during this time.  By the time she got there, she was 12 hours NPO, which was upsetting to her.  In the ED, she was placed in the hallway and waited for 7 hours.   ? ?Her impression was that Radiology had planned to do the procedure and that was what she understood.  She noted that she was admitted by them, as she understood, and she wanted to know who discharged her.  She notes that she spoke with the patient advocate. Her home health aide came and got her.  ? ?She feels that she is short of breath and congested because she is developing bronchitis or a cold.  On Monday, she was also having sore throat and eye pain with bright lights.  She notes she was very uncomfortable being NPO for that long.  ? ?She would feel better if she was treated with an antibiotic before her rescheduled biopsy next week.  She would like to get a refill of the dexamethasone which helped her pain more than the morphine she was prescribed.  ? ?I spoke with Ms. Thelen for approx 30 minutes.  She was in a better state of mind when we ended our conversation.  ? ?Plan:  ?Amoxicillin 500mg  BID for 7 days ?Refill Dexamethasone 4mg  BID for 15 days ?Follow up on 3/30 for her biopsy of the sacrum ?I did advise her to take her diltiazem with a small drink of water on  the morning of her procedure.   ?

## 2021-11-12 NOTE — Telephone Encounter (Signed)
Patient called in VERY upset about her ED experience. States no one from Radiology or [facility] checked in on her. She was left in hallway for 7 hours w/o anything to eat or drink. She is requesting call back from PCP today to discuss. ?

## 2021-11-13 ENCOUNTER — Telehealth: Payer: Self-pay | Admitting: Internal Medicine

## 2021-11-13 NOTE — Telephone Encounter (Signed)
Scheduled per los, patient has been called and notified. 

## 2021-11-13 NOTE — Telephone Encounter (Signed)
.  Called patient to schedule appointment per 3/23 inbasket, patient is aware of date and time.   ?

## 2021-11-17 ENCOUNTER — Telehealth: Payer: Self-pay

## 2021-11-17 ENCOUNTER — Telehealth: Payer: Medicare Other

## 2021-11-17 NOTE — Telephone Encounter (Signed)
?  Care Management  ? ?Outreach Note ? ?11/17/2021 ?Name: Shannon [name] MRN: [id number] DOB: 1955-10-18 ? ?Referred by: [name], MD ?Reason for referral : No chief complaint on file. ? ? ?An unsuccessful telephone outreach was attempted today. The patient was referred to the case management team for assistance with care management and care coordination.  ? ?Follow Up Plan: The patient has been provided with contact information for the care management team and has been advised to call with any health related questions or concerns.  ? ?[name], RN, BSN, CCM ?Care Management Coordinator ?[facility] ?Phone: [phone]/FCZ: [phone]  ?

## 2021-11-17 NOTE — Telephone Encounter (Signed)
?  Care Management  ? ?Outreach Note ? ?11/17/2021 ?Name: Shannon [name] MRN: [id number] DOB: 10-Nov-1955 ? ?Referred by: [name], MD ?Reason for referral : No chief complaint on file. ? ?Received message from [place] patient had returned call from this morning and requested this RNCM return call to her.  Attempted to reach patient at 12:31 and left voicemail message with direct call phone number for this RNCM. ? ?Follow Up Plan: The patient has been provided with contact information for the care management team and has been advised to call with any health related questions or concerns.  ? ?[name], RN, BSN, CCM ?Care Management Coordinator ?[facility] ?Phone: [phone]/IRJ: [phone]  ?

## 2021-11-18 ENCOUNTER — Other Ambulatory Visit (HOSPITAL_COMMUNITY): Payer: Self-pay | Admitting: Physician Assistant

## 2021-11-18 ENCOUNTER — Ambulatory Visit: Payer: Self-pay

## 2021-11-18 DIAGNOSIS — J45909 Unspecified asthma, uncomplicated: Secondary | ICD-10-CM | POA: Diagnosis not present

## 2021-11-18 DIAGNOSIS — J449 Chronic obstructive pulmonary disease, unspecified: Secondary | ICD-10-CM | POA: Diagnosis not present

## 2021-11-18 DIAGNOSIS — R911 Solitary pulmonary nodule: Secondary | ICD-10-CM | POA: Diagnosis not present

## 2021-11-18 DIAGNOSIS — R269 Unspecified abnormalities of gait and mobility: Secondary | ICD-10-CM | POA: Diagnosis not present

## 2021-11-18 NOTE — Chronic Care Management (AMB) (Signed)
? ?  11/18/2021 ? ?Shannon [name] ?09-04-1955 ?[id number] ? ?Incoming call from patient this afternoon.  We discussed her upcoming biopsy scheduled for 11/20/21.   Patient continues to have concerns about her recent emergency room visit.  Encouraged patient to rest and focus her energy on getting through her biopsy.  Patient shared she is suppose to take her medications up until noon tomorrow.  Patient is getting significant relief from the Dexamethasone which has helped with her pain. Plan to follow up with patient after her biopsy. ? ?[name], RN, BSN, CCM ?Care Management Coordinator ?[facility] ?Phone: [phone]/ZHG: [phone]  ? ?

## 2021-11-20 ENCOUNTER — Ambulatory Visit (HOSPITAL_COMMUNITY): Payer: Medicare Other

## 2021-11-20 ENCOUNTER — Ambulatory Visit (HOSPITAL_COMMUNITY)
Admission: RE | Admit: 2021-11-20 | Discharge: 2021-11-20 | Disposition: A | Discharge status: Home / Self Care | Payer: Medicare Other | Source: Ambulatory Visit | Attending: Internal Medicine | Admitting: Internal Medicine

## 2021-11-20 ENCOUNTER — Encounter (HOSPITAL_COMMUNITY): Payer: Self-pay

## 2021-11-20 ENCOUNTER — Encounter: Payer: Medicare Other | Admitting: Nurse Practitioner

## 2021-11-20 ENCOUNTER — Other Ambulatory Visit: Payer: Self-pay

## 2021-11-20 ENCOUNTER — Ambulatory Visit: Payer: Medicare Other | Admitting: Internal Medicine

## 2021-11-20 DIAGNOSIS — R011 Cardiac murmur, unspecified: Secondary | ICD-10-CM | POA: Insufficient documentation

## 2021-11-20 DIAGNOSIS — I4892 Unspecified atrial flutter: Secondary | ICD-10-CM | POA: Insufficient documentation

## 2021-11-20 DIAGNOSIS — F419 Anxiety disorder, unspecified: Secondary | ICD-10-CM | POA: Insufficient documentation

## 2021-11-20 DIAGNOSIS — C799 Secondary malignant neoplasm of unspecified site: Secondary | ICD-10-CM | POA: Diagnosis not present

## 2021-11-20 DIAGNOSIS — C3490 Malignant neoplasm of unspecified part of unspecified bronchus or lung: Secondary | ICD-10-CM | POA: Diagnosis not present

## 2021-11-20 DIAGNOSIS — R0600 Dyspnea, unspecified: Secondary | ICD-10-CM | POA: Insufficient documentation

## 2021-11-20 DIAGNOSIS — C7951 Secondary malignant neoplasm of bone: Secondary | ICD-10-CM | POA: Diagnosis not present

## 2021-11-20 DIAGNOSIS — I2510 Atherosclerotic heart disease of native coronary artery without angina pectoris: Secondary | ICD-10-CM | POA: Diagnosis not present

## 2021-11-20 DIAGNOSIS — D649 Anemia, unspecified: Secondary | ICD-10-CM | POA: Insufficient documentation

## 2021-11-20 DIAGNOSIS — J45909 Unspecified asthma, uncomplicated: Secondary | ICD-10-CM | POA: Insufficient documentation

## 2021-11-20 DIAGNOSIS — K219 Gastro-esophageal reflux disease without esophagitis: Secondary | ICD-10-CM | POA: Insufficient documentation

## 2021-11-20 DIAGNOSIS — J449 Chronic obstructive pulmonary disease, unspecified: Secondary | ICD-10-CM | POA: Insufficient documentation

## 2021-11-20 DIAGNOSIS — M899 Disorder of bone, unspecified: Secondary | ICD-10-CM | POA: Diagnosis not present

## 2021-11-20 DIAGNOSIS — E785 Hyperlipidemia, unspecified: Secondary | ICD-10-CM | POA: Insufficient documentation

## 2021-11-20 DIAGNOSIS — B20 Human immunodeficiency virus [HIV] disease: Secondary | ICD-10-CM | POA: Diagnosis not present

## 2021-11-20 DIAGNOSIS — I4891 Unspecified atrial fibrillation: Secondary | ICD-10-CM | POA: Diagnosis not present

## 2021-11-20 LAB — CBC
HCT: 44.2 % (ref 36.0–46.0)
Hemoglobin: 14.1 g/dL (ref 12.0–15.0)
MCH: 31.8 pg (ref 26.0–34.0)
MCHC: 31.9 g/dL (ref 30.0–36.0)
MCV: 99.5 fL (ref 80.0–100.0)
Platelets: 152 10*3/uL (ref 150–400)
RBC: 4.44 MIL/uL (ref 3.87–5.11)
RDW: 14 % (ref 11.5–15.5)
WBC: 8.8 10*3/uL (ref 4.0–10.5)
nRBC: 0 % (ref 0.0–0.2)

## 2021-11-20 LAB — PROTIME-INR
INR: 1 (ref 0.8–1.2)
Prothrombin Time: 13.1 seconds (ref 11.4–15.2)

## 2021-11-20 MED ORDER — MIDAZOLAM HCL 2 MG/2ML IJ SOLN
INTRAMUSCULAR | Status: AC | PRN
  Administered 2021-11-20 (×2): .5 mg via INTRAVENOUS

## 2021-11-20 MED ORDER — SODIUM CHLORIDE 0.9 % IV SOLN
INTRAVENOUS | Status: AC
  Filled 2021-11-20: qty 250

## 2021-11-20 MED ORDER — FENTANYL CITRATE (PF) 100 MCG/2ML IJ SOLN
INTRAMUSCULAR | Status: AC
  Filled 2021-11-20: qty 4

## 2021-11-20 MED ORDER — SODIUM CHLORIDE 0.9 % IV SOLN: INTRAVENOUS | Status: DC

## 2021-11-20 MED ORDER — MIDAZOLAM HCL 2 MG/2ML IJ SOLN
INTRAMUSCULAR | Status: AC
  Filled 2021-11-20: qty 4

## 2021-11-20 MED ORDER — FENTANYL CITRATE (PF) 100 MCG/2ML IJ SOLN
INTRAMUSCULAR | Status: AC | PRN
  Administered 2021-11-20 (×2): 25 ug via INTRAVENOUS

## 2021-11-20 NOTE — H&P (Signed)
Chief Complaint: Patient was seen in consultation today for biopsy of sacral lesion at the request of [facility]  Referring Physician(s): Mohamed,Mohamed  Supervising Physician: Gilmer Mor  Patient Status: [facility] - Out-pt  History of Present Illness: Shannon [name] is a 66 y.o. female w/ PMH of anemia, anxiety, asthma, A-fib, A flutter, CFS, COPD, dyspnea, non-small cell lung cancer, GERD, heart murmur, PE, HIV, HLD, HTN and CAD. Pt has recurrent non-small cell lung cancer, adenocarcinoma with known metastasis to bone including ribs, thoracic and lumbar spine. Pt referred to IR for biopsy of metastatic lesion in the sacral soft tissue area at the request of Dr. Arbutus Ped.  Dr. Oley Balm approved core R sacral metastasis biopsy.  PET scan 10/16/21: IMPRESSION: 1. Several hypermetabolic lytic osseous metastases to the bilateral ribs, thoracic spine and sacrum, all new since 01/25/2020 PET-CT. 2. Treated index pulmonary nodule in the left upper lobe is significantly decreased in size and metabolism compared to baseline 01/25/2020 PET-CT. 3. Numerous additional small solid bilateral pulmonary nodules scattered throughout both lungs, largest 0.5 cm in the right upper lobe, below PET resolution, all new since baseline 01/25/2020 PET-CT, unchanged from 09/29/2021 chest CT, suspicious for small bilateral pulmonary metastases.  Past Medical History:  Diagnosis Date   Anemia    Anxiety    HX PANIC ATTACKS   Arthritis    "starting to; in my hands" (07/09/2015)   Asthma    Atrial fibrillation (HCC)    Atrial flutter, paroxysmal (HCC)    Bloated abdomen    CFS (chronic fatigue syndrome)    Chewing difficulty    Chronic asthma with acute exacerbation    "I have chronic asthma all the time; sometimes exacerbations" (07/09/2015)   Chronic lower back pain    COPD (chronic obstructive pulmonary disease) (HCC)    Cyst of right kidney    "3 of them; dx'd in ~ 01/2015"   Dyspnea     GERD (gastroesophageal reflux disease)    Heart murmur    History of blood transfusion    "related to my brain surgery I think"   History of pulmonary embolism 07/09/2015   History of radiation therapy 04/09/20-04/22/20   SBRT Left Lung, Dr. Antony Blackbird   HIV antibody positive (HCC)    HIV disease (HCC)    Hyperlipidemia    Hypertension    Leg edema    Lipodystrophy    Mild CAD 2013   Multiple thyroid nodules    Osteopenia    Palpitations    Pneumonia 07/09/2015   Shingles    Vitamin B 12 deficiency    Vitamin D deficiency     Past Surgical History:  Procedure Laterality Date   ABDOMINAL HYSTERECTOMY     "robotic laparosopic"   BRAIN SURGERY  1974   "brain tumor; benign; on top of my brain; got a plate in there"   BRAIN SURGERY     age 66- -"Tumor pushing my skullout"   BRONCHIAL BIOPSY  02/13/2020   Procedure: BRONCHIAL BIOPSIES;  Surgeon: Leslye Peer, MD;  Location: [facility] ENDOSCOPY;  Service: Pulmonary;;   BRONCHIAL BRUSHINGS  02/13/2020   Procedure: BRONCHIAL BRUSHINGS;  Surgeon: Leslye Peer, MD;  Location[facility] ENDOSCOPY;  Service: Pulmonary;;   BRONCHIAL NEEDLE ASPIRATION BIOPSY  02/13/2020   Procedure: BRONCHIAL NEEDLE ASPIRATION BIOPSIES;  Surgeon: Leslye Peer, MD;  Location: [facility] ENDOSCOPY;  Service: Pulmonary;;   CARDIAC CATHETERIZATION     FIDUCIAL MARKER PLACEMENT  02/13/2020   Procedure: FIDUCIAL MARKER  PLACEMENT;  Surgeon: Leslye Peer, MD;  Location: [facility] ENDOSCOPY;  Service: Pulmonary;;   TONSILLECTOMY AND ADENOIDECTOMY     VIDEO BRONCHOSCOPY WITH ENDOBRONCHIAL NAVIGATION N/A 02/13/2020   Procedure: VIDEO BRONCHOSCOPY WITH ENDOBRONCHIAL NAVIGATION;  Surgeon: Leslye Peer, MD;  Location: [facility] ENDOSCOPY;  Service: Pulmonary;  Laterality: N/A;    Allergies: Lisinopril, Tree extract, Augmentin [amoxicillin-pot clavulanate], and Ciprofloxacin  Medications: Prior to Admission medications   Medication Sig Start Date End Date Taking? Authorizing Provider   A&D OINT Apply 1 application. topically daily as needed (rash).    [provider]  acetaminophen (TYLENOL) 500 MG tablet Take 2 tablets (1,000 mg total) by mouth every 6 (six) hours as needed. Patient taking differently: Take 500 mg by mouth every 6 (six) hours as needed. 02/28/21 02/28/22  Belva Agee, MD  albuterol (PROVENTIL) (2.5 MG/3ML) 0.083% nebulizer solution Take 3 mLs (2.5 mg total) by nebulization every 4 (four) hours as needed for wheezing or shortness of breath. 04/16/21 04/16/22  Albertha Ghee, MD  albuterol (VENTOLIN HFA) 108 (90 Base) MCG/ACT inhaler Inhale 2 puffs into the lungs every 6 (six) hours as needed for wheezing or shortness of breath. 11/07/21   Inez Catalina, MD  Ascorbic Acid (VITAMIN C) 1000 MG tablet Take 1,000 mg by mouth every other day.     [provider]  b complex vitamins tablet Take 1 tablet by mouth daily.    [provider]  BIKTARVY 50-200-25 MG TABS tablet TAKE 1 TABLET BY MOUTH DAILY 09/01/21   Judyann Munson, MD  BIOTIN PO Take 1 tablet by mouth every other day.    [provider]  Calcium Citrate-Vitamin D (CALCIUM + D PO) Take 1 tablet by mouth every other day.    [provider]  CALCIUM PO Take 1 tablet by mouth daily.    [provider]  cyanocobalamin (,VITAMIN B-12,) 1000 MCG/ML injection ADMINISTER 1 ML(1000 MCG) IN THE MUSCLE EVERY 30 DAYS 11/12/21   Inez Catalina, MD  dexamethasone (DECADRON) 4 MG tablet Take 1 tablet (4 mg total) by mouth 2 (two) times daily with a meal. 11/12/21   Inez Catalina, MD  diltiazem (CARDIZEM CD) 240 MG 24 hr capsule TAKE 1 CAPSULE(240 MG) BY MOUTH DAILY 07/12/21   Inez Catalina, MD  famotidine (PEPCID) 40 MG tablet Take 1 tablet (40 mg total) by mouth daily. 09/04/21   Esterwood, Amy S, PA-C  furosemide (LASIX) 20 MG tablet TAKE 1 TABLET(20 MG) BY MOUTH DAILY AS NEEDED Patient taking differently: Take 20 mg by mouth every other day. 07/12/21   Inez Catalina, MD  magnesium hydroxide (MILK OF MAGNESIA) 400 MG/5ML suspension Take 15 mLs by mouth daily as needed for mild constipation.    [provider]  Melatonin 5 MG TABS Take 5 mg by mouth at bedtime as needed (for sleep).    [provider]  Misc. Devices (PULSE OXIMETER FOR FINGER) MISC 1 Units by Does not apply route as needed. 01/19/19   Chilton Si, MD  morphine (MS CONTIN) 15 MG 12 hr tablet Take 1 tablet by mouth every 12 hours. 10/15/21   Antony Blackbird, MD  Mouthwashes (BIOTENE DRY MOUTH GENTLE) LIQD Use as directed 1 Dose in the mouth or throat 2 (two) times daily as needed (dry mouth). 02/13/20   Leslye Peer, MD  Naphazoline HCl (CLEAR EYES OP) Place 1 drop into both eyes daily as needed (redness).    [provider]  neomycin-bacitracin-polymyxin (NEOSPORIN) ointment Apply 1 application. topically as needed for wound care.    [provider]  Olopatadine HCl (PATADAY OP) Place 1 drop into both eyes daily as needed (burning).    [provider]  ondansetron (ZOFRAN) 4 MG tablet Take 1 tablet (4 mg total) by mouth every 8 (eight) hours as needed for nausea or vomiting. Patient not taking: Reported on 11/05/2021 10/15/21   Antony Blackbird, MD  Oxycodone HCl 10 MG TABS Take 10 mg by mouth every 6 (six) hours as needed (breakthrough pain). 09/30/21   [provider]  OXYGEN Inhale 1.5-2 L into the lungs See admin instructions. Uses at bedtime and through the day as needed    [provider]  Phenylephrine-Cocoa Butter (PREPARATION H RE) Place 1 application. rectally daily as needed (hemorrhoids).    [provider]  Polyethyl Glycol-Propyl Glycol (SYSTANE OP) Place 1 drop into both eyes daily as needed (dry eyes).    [provider]  spironolactone (ALDACTONE) 25 MG tablet Take 1 tablet (25 mg total) by mouth daily. Patient not taking: Reported on 11/05/2021 08/28/21 11/26/21  Elige Radon, MD  SYRINGE/NEEDLE,  DISP, 1 ML (B-D SYRINGE/NEEDLE 1CC/25GX5/8) 25G X 5/8" 1 ML MISC 1 Units by Does not apply route every 30 (thirty) days. 04/14/19   Inez Catalina, MD  temazepam (RESTORIL) 15 MG capsule Take 1 capsule (15 mg total) by mouth at bedtime as needed for sleep. 11/06/21   Si Gaul, MD  XARELTO 20 MG TABS tablet TAKE 1 TABLET(20 MG) BY MOUTH DAILY Patient not taking: Reported on 11/05/2021 07/24/21   Chilton Si, MD     Family History  Problem Relation Age of Onset   Asthma Mother    Heart failure Mother        cardiomyopathy   Thyroid disease Mother    Heart disease Mother    Obesity Mother    Hypertension Mother    Heart murmur Sister    Heart murmur Brother    Diabetes Sister    Thyroid disease Sister    Heart murmur Sister     Social History   Socioeconomic History   Marital status: Single    Spouse name: Not on file   Number of children: Not on file   Years of education: Not on file   Highest education level: Not on file  Occupational History   Occupation: student  Tobacco Use   Smoking status: Former    Types: Cigarettes   Smokeless tobacco: Never   Tobacco comments:    Pt states she smoked when she was in college many years ago.  Vaping Use   Vaping Use: Never used  Substance and Sexual Activity   Alcohol use: Not Currently    Alcohol/week: 0.0 standard drinks    Comment: Rarely.   Drug use: No   Sexual activity: Not Currently    Partners: Male    Comment: declined condoms  Other Topics Concern   Not on file  Social History Narrative   Originally from [place] then [place] now [place] 2016 approx   Disabled    Rare alcohol, never smoker, no drug use            Social Determinants of Corporate investment banker Strain: Not on file  Food Insecurity: No Food Insecurity   Worried About Programme researcher, broadcasting/film/video in the Last Year: Never true   Barista in the Last Year: Never true  Transportation Needs:  Not on file  Physical Activity: Inactive   Days of  Exercise per Week: 0 days   Minutes of Exercise per Session: 0 min  Stress: Not on file  Social Connections: Not on file    Review of Systems: A 12 point ROS discussed and pertinent positives are indicated in the HPI above.  All other systems are negative.  Review of Systems  Constitutional:  Negative for chills and fever.  Respiratory:  Positive for shortness of breath.   Cardiovascular:  Positive for leg swelling. Negative for chest pain.  Gastrointestinal:  Positive for abdominal pain. Negative for nausea and vomiting.  Musculoskeletal:  Positive for back pain.  Neurological:  Positive for dizziness, weakness and headaches.   Vital Signs: There were no vitals taken for this visit.  Physical Exam Constitutional:      Appearance: She is ill-appearing.  HENT:     Head: Normocephalic and atraumatic.     Mouth/Throat:     Mouth: Mucous membranes are moist.     Pharynx: Oropharynx is clear.  Eyes:     Extraocular Movements: Extraocular movements intact.     Pupils: Pupils are equal, round, and reactive to light.  Cardiovascular:     Rate and Rhythm: Normal rate and regular rhythm.     Pulses: Normal pulses.     Heart sounds: Normal heart sounds.  Pulmonary:     Effort: Pulmonary effort is normal. No respiratory distress.     Breath sounds: Normal breath sounds.  Abdominal:     General: Bowel sounds are normal. There is no distension.     Palpations: Abdomen is soft.     Tenderness: There is no abdominal tenderness. There is no guarding.  Musculoskeletal:     Right lower leg: Edema present.     Left lower leg: Edema present.  Skin:    General: Skin is warm and dry.  Neurological:     Mental Status: She is alert and oriented to person, place, and time.  Psychiatric:        Mood and Affect: Mood normal.        Behavior: Behavior normal.        Thought Content: Thought content normal.        Judgment: Judgment normal.    Imaging: DG Chest 2 View  Result Date:  11/10/2021 CLINICAL DATA:  Shortness of breath and cough EXAM: CHEST - 2 VIEW COMPARISON:  08/27/2020 FINDINGS: Mild chronic cardiomegaly and tortuosity of the aorta. Previous surgical changes of the left perihilar lung are stable. No sign of acute infiltrate, collapse or effusion. Lytic lesion of the T9 vertebral body with partial collapse. Apparent lytic lesion of the right posterior lateral fifth rib and left lateral fourth or fifth rib. IMPRESSION: No active cardiopulmonary disease is seen by radiography. Small nodules previously shown by PET are not visible. Chronic postsurgical changes of the left perihilar lung. Bilateral rib metastases and T9 vertebral body metastasis. Numerous other metastatic lesions were shown by recent PET study 1 month ago. Electronically Signed   By: Paulina Fusi M.D.   On: 11/10/2021 11:05   MR BRAIN WO CONTRAST  Result Date: 11/04/2021 CLINICAL DATA:  66 year old female with metastatic non-small cell lung cancer. Staging. History of right parietal craniotomy "tumor resection 1974." A study without and with contrast was planned but the patient declined IV contrast. EXAM: MRI HEAD WITHOUT CONTRAST TECHNIQUE: Multiplanar, multiecho pulse sequences of the brain and surrounding structures were obtained without intravenous contrast. COMPARISON:  Brain MRI without and with contrast 05/07/2016. PET-CT 10/16/2021. FINDINGS: Brain: Mild susceptibility artifact related to chronic right parietal craniotomy or craniectomy. No restricted diffusion to suggest acute infarction. No midline shift, mass effect, ventriculomegaly, extra-axial collection or acute intracranial hemorrhage. Cervicomedullary junction and pituitary are within normal limits. Patchy chronic bilateral cerebral white matter T2 and FLAIR hyperintensity with no strong evidence of vasogenic edema on this noncontrast exam. Chronic increased perivascular spaces in the brain are stable. Small chronic lacunar infarcts in the right  caudate and thalamus appears stable. Possible tiny chronic lacunar infarct in the right cerebellum is stable (series 8, image 6). Occasional chronic appearing microhemorrhages visible now with SWI (right thalamus series 9, image 37). Vascular: Major intracranial vascular flow voids are stable since 2017 with generalized intracranial artery tortuosity. Skull and upper cervical spine: Expansile lesion in the right clivus and along the anterior foramen magnum with malignant appearing marrow replacement and mild expansion of the bone (series 11 image 9). Associated abnormal diffusion within the tumor (series 5, image 54). But elsewhere chronically heterogeneous bone marrow signal of the calvarium appears stable since 2017, including occasional small round T2 hyperintense foci such as that on series 8, image 18. previous right parietal craniotomy or craniectomy. Visible marrow signal in the cervical spine remains normal. Sinuses/Orbits: Stable, negative. Other: Trace chronic mastoid fluid is stable. Grossly normal visible internal auditory structures. IMPRESSION: 1. Expansile bony metastasis at the right skull base/clivus. 2. The patient declined IV contrast. No evidence of brain metastases in the absence of contrast. 3. Stable postoperative changes to the right calvarium and chronic small vessel disease since a 2017 MRI. Electronically Signed   By: Odessa Fleming M.D.   On: 11/04/2021 09:56    Labs:  CBC: Recent Labs    11/06/21 0938 11/10/21 0905 11/10/21 1108 11/20/21 0830  WBC 6.6 7.9 7.5 8.8  HGB 12.8 13.3 13.2 14.1  HCT 40.6 40.5 40.6 44.2  PLT 145* 123* 111* 152    COAGS: Recent Labs    11/10/21 0903 11/20/21 0830  INR 1.0 1.0    BMP: Recent Labs    06/30/21 1429 08/27/21 1343 11/06/21 0938 11/10/21 1108  NA 144 141 137 138  K 4.4 4.7 4.5 3.7  CL 103 101 98 99  CO2 32* 32 36* 31  GLUCOSE 95 154* 119* 92  BUN 21 20 30* 22  CALCIUM 10.1 10.2 9.9 9.4  CREATININE 1.28* 1.49* 1.15* 1.20*   GFRNONAA  --   --  52* 50*    LIVER FUNCTION TESTS: Recent Labs    03/12/21 1020 08/27/21 1343 11/06/21 0938 11/10/21 1108  BILITOT 0.2 0.5 0.5 0.8  AST 18 20 23 27   ALT 15 12 36 34  ALKPHOS 98  --  102 95  PROT 6.6 6.8 6.2* 5.6*  ALBUMIN 4.2  --  3.7 3.3*    TUMOR MARKERS: No results for input(s): AFPTM, CEA, CA199, CHROMGRNA in the last 8760 hours.  Assessment and Plan: History of anemia, anxiety, asthma, A-fib, A flutter, CFS, COPD, dyspnea, non-small cell lung cancer, GERD, heart murmur, PE, HIV, HLD, HTN and CAD. Pt has recurrent non-small cell lung cancer, adenocarcinoma with known metastasis to bone including ribs, thoracic and lumbar spine. Pt referred to IR for biopsy of metastatic lesion in the sacral soft tissue area at the request of Dr. Arbutus Ped.  Dr. Oley Balm approved core R sacral metastasis biopsy.  PET scan 10/16/21: IMPRESSION: 1. Several hypermetabolic lytic osseous metastases to the bilateral  ribs, thoracic spine and sacrum, all new since 01/25/2020 PET-CT. 2. Treated index pulmonary nodule in the left upper lobe is significantly decreased in size and metabolism compared to baseline 01/25/2020 PET-CT. 3. Numerous additional small solid bilateral pulmonary nodules scattered throughout both lungs, largest 0.5 cm in the right upper lobe, below PET resolution, all new since baseline 01/25/2020 PET-CT, unchanged from 09/29/2021 chest CT, suspicious for small bilateral pulmonary metastases.  Pt noted to be resting on stretcher. She is A&O, calm and pleasant. She is in no distress.  Pt is NPO per order.  She no longer takes blood thinning medication.   Risks and benefits of metastatic sacral lesion biopsy was discussed with the patient and/or patient's family including, but not limited to bleeding, infection, damage to adjacent structures or low yield requiring additional tests.  All of the questions were answered and there is agreement to  proceed.  Consent signed and in chart.   Thank you for this interesting consult.  I greatly enjoyed meeting Shannon [name] and look forward to participating in their care.  A copy of this report was sent to the requesting provider on this date.  Electronically Signed: Shon Hough, NP 11/20/2021, 10:05 AM   I spent a total of 20 minutes in face to face in clinical consultation, greater than 50% of which was counseling/coordinating care for biopsy of metastatic sacral lesion.

## 2021-11-20 NOTE — Procedures (Signed)
Interventional Radiology Procedure Note ? ?Procedure: CT guided  core biopsy of right posterior pathologic rib lesion ?Complications: None ?Recommendations: ?- Bedrest supine x 1 hrs ?- OTC's PRN  Pain ?- Follow biopsy results ? ?Signed, ? ?Dulcy Fanny. Earleen Newport, DO ? ? ?

## 2021-11-20 NOTE — Discharge Instructions (Signed)

## 2021-11-21 ENCOUNTER — Telehealth: Payer: Self-pay

## 2021-11-21 NOTE — Telephone Encounter (Signed)
Spoke with PCP. She did not attempt to call patient. Biopsy results are not back. May take 72 hours to 7 business days to result per PCP. Will forward to CM to see if she attempted to contact patient. ?

## 2021-11-21 NOTE — Telephone Encounter (Signed)
?  Chronic Care Management  ? ?Note ? ?11/21/2021 ?Name: MATRICIA BEGNAUD MRN: [id number] DOB: 1956-03-15 ? ?Unsuccessful outreach to patient this morning to check on how she was feeling after her biopsy.  HIPAA compliant message left on name identified voicemail with return call information and reason for my call.   Will await patient return call. ?[name], RN, BSN, CCM ?Care Management Coordinator ?[facility] ?Phone: [phone]/QLR: [phone]  ?

## 2021-11-21 NOTE — Telephone Encounter (Signed)
Pt is requesting a call back ... she stated that she missed a call and was wondering if it was about her biopsy from yesterday  ?

## 2021-11-24 ENCOUNTER — Ambulatory Visit: Payer: Self-pay

## 2021-11-24 LAB — SURGICAL PATHOLOGY

## 2021-11-24 NOTE — Chronic Care Management (AMB) (Signed)
? ?  11/24/2021 ? ?Ocie Bob Kassel ?[birth year] ?[id number] ? ?Incoming call from patient to discuss how she is doing after her biopsy.  Patient was very pleased with the nurses and Dr. Earleen Newport at [facility].  Her biopsy went well, she is feeling sore after the procedure and has a follow up with her oncologist on Thursday.  She has pain medication on hand. ?We discussed her breathing and need for a portable oxygen concentrator as she cannot physically manage the portable tank.  Discussed DME options as she has been working with [employer] and has not obtained her portable concentrator in months.  Patient agreed to me reaching out to [facility] care to see if they can expedite her getting a portable concentrator.  Call made to [facility], RRT to discuss Ms. Sapps needs.  [email or web address]; [phone].  Provided Prentiss Bells with patient information, there is an order already in Epic and she will review to see if they can process the portable concentrator.  Patient has an active diagnosis of C34.90 Lung Cancer.  Plan to follow up with [facility] on 11/25/21. ?[name], RN, BSN, CCM ?Care Management Coordinator ?[facility] ?Phone: [phone]/DVO: [phone]  ? ? ? ? ? ? ? ? ? ? ?

## 2021-11-25 ENCOUNTER — Other Ambulatory Visit: Payer: Medicare Other

## 2021-11-25 ENCOUNTER — Other Ambulatory Visit: Payer: Self-pay

## 2021-11-25 ENCOUNTER — Ambulatory Visit: Payer: Self-pay

## 2021-11-25 DIAGNOSIS — B20 Human immunodeficiency virus [HIV] disease: Secondary | ICD-10-CM

## 2021-11-25 NOTE — Chronic Care Management (AMB) (Signed)
? ?  11/25/2021 ? ?Ocie Bob Seeber ?[birth year] ?[id number] ? ? ?Multiple calls placed to Solon [place], RRT at [phone] at [facility] to discuss patients need for a portable oxygen concentrator and if they could get one for her.  Faxed patient face sheet to Prentiss Bells at [phone] to have them review her insurance and if they are in network.   ?Spoke with Prentiss Bells again this afternoon and she informed this RNCM that patient's insurance is out of network and they will not be able to provide services for the patient.  ?Call placed to patient and informed her of the issue with her insurance being out of network.  Patient reported that she will speak to her oncologist and they may have another resource for portable concentrators.  To date, this RNCM has tried [employer] and [employer] to get a concentrator for patient without success. ?Plan to contact patient next week for update on her Oncology appointment. ?[name], RN, BSN, CCM ?Care Management Coordinator ?[facility] ?Phone: [phone]/LGX: [phone]   ?

## 2021-11-26 ENCOUNTER — Encounter (HOSPITAL_BASED_OUTPATIENT_CLINIC_OR_DEPARTMENT_OTHER): Payer: Self-pay | Admitting: Cardiovascular Disease

## 2021-11-26 ENCOUNTER — Ambulatory Visit (INDEPENDENT_AMBULATORY_CARE_PROVIDER_SITE_OTHER): Payer: Medicare Other | Admitting: Cardiovascular Disease

## 2021-11-26 DIAGNOSIS — I483 Typical atrial flutter: Secondary | ICD-10-CM | POA: Diagnosis not present

## 2021-11-26 DIAGNOSIS — I5033 Acute on chronic diastolic (congestive) heart failure: Secondary | ICD-10-CM | POA: Diagnosis not present

## 2021-11-26 DIAGNOSIS — I10 Essential (primary) hypertension: Secondary | ICD-10-CM

## 2021-11-26 LAB — T-HELPER CELL (CD4) - (RCID CLINIC ONLY)
CD4 % Helper T Cell: 32 % — ABNORMAL LOW (ref 33–65)
CD4 T Cell Abs: 143 /uL — ABNORMAL LOW (ref 400–1790)

## 2021-11-26 MED ORDER — FUROSEMIDE 20 MG PO TABS: ORAL_TABLET | ORAL | 0 refills | Status: DC

## 2021-11-26 MED ORDER — POTASSIUM CHLORIDE ER 10 MEQ PO TBCR: EXTENDED_RELEASE_TABLET | ORAL | 0 refills | Status: DC

## 2021-11-26 NOTE — Assessment & Plan Note (Deleted)
She has  ?

## 2021-11-26 NOTE — Assessment & Plan Note (Signed)
Blood pressure remains uncontrolled.  However today she has not taken her blood pressure medication and her pain is well controlled.  Encouraged her to take her she gets home.  She reports that she is no longer taking clonidine because it "was not working."  Of note, her blood pressure was objectively much better when she was taking it.  For now, we will compromise with just getting her to take the diltiazem and spironolactone. ?

## 2021-11-26 NOTE — Assessment & Plan Note (Addendum)
Today she is extremely volume overloaded.  Her neck veins are elevated and she has lower extremity edema.  Her legs are weeping.  She has not been very consistent with taking her Lasix.  We need to increase Lasix to 40 mg twice daily.  We will also increase potassium to 40 mEq while she is on the higher dose continue this for 5 days.  Check BMP and BNP.  Blood pressure control as above.  Encouraged her to wear some compression socks as well elevate her legs. ?

## 2021-11-26 NOTE — Progress Notes (Signed)
? ?Cardiology Office Note  ? ?Date:  11/26/2021  ? ?ID:  Shannon [name], DOB 09-20-1955, MRN [id number] ? ?PCP:  [name], MD  ?Cardiologist:  Skeet Latch, MD  ?Electrophysiologist:  None  ? ?Evaluation Performed:  Follow-Up Visit ? ?Chief Complaint:   ? ?History of Present Illness:   ? ?Shannon [name] is a 66 y.o. female with stage 4 non-small cell lung cancer, hypertension, paroxysmal atrial flutter, asthma, medication non-compliance, non-obstructive CAD, OSA, prior PE, moderate pulmonary hypertension, mild ascending aorta aneurysm, PAD, and HIV (well-controlled).  Shannon [name] was initially referred by [name], MD, on 12/26/15. [name] was concerned about her lower extremity edema and shortness of breath.  She was started on lasix 20 mg every other day.  Shannon [name] noted shortness of breath after a hospitalization for pneumonia 06/2015.   She denied lower extremity edema or orthopnea but did report chest pain when laying down at night.  She was referred for an echo 01/2016 that revealed LVEF 60-65% awith grade 1 diastolic dysfunction and hypokinesis of the basal inferior wall.  She also had very mild aortic stenosis with a mean gradient of 9 mmHg and trivial AR.  PASP was 52 mmHg.  She had a Lexiscan Myoview at that time that was negative for ischemia.  Shannon [name] previously had a Lexiscan Cardiolite in September 2013 that revealed a medium, mild reversible defect in the anterior wall and normal systolic function. She subsequently underwent cardiac catheterization that revealed nonobstructive coronary disease. She had mild pulmonary hypertension and a normal cardiac output. Left ventriculography revealed mild to moderate aortic regurgitation.  She had a 24-hour Holter in February 2015 that revealed rare PACs and PVCs.  Echo 01/06/17 revealed LVEF 60-65% with mild LVH and a mean aortic valve gradient of 8 mmHg.  Tricuspid regurgitation was not significant enough to assess pulmonary pressures.  Repeat echo  01/2019 revealed LVEF >65%, severe LVH, intracavitary gradient 61 mmHg, mild AS (mean gradient 13 mmHg), mild-moderate AR, and mild ascending aorta aneurysm (4.1 cm). ?  ?Shannon [name] was admitted to the hospital 01/25/17 with hypertensive emergency and chest pain. Her blood pressure was 215/100.  She consistently struggles with mediation adherence and has strong feelings about what medicines she should take and when she should take them.  Shannon [name] [facility] and has received a lot of help there but hasn't been going lately 2/2 COVID-19.  Lisinopril was stopped due to cough and throat swelling.  Shannon [name] reported claudication and had ABIs performed by her PCP, Dr. Daryll Drown.  ABI/Dopplers 02/2019 revealed moderate R LE arterial disease and mild L LE disease.  She was referred to our vascular specialists but prefers to keep seeing me.   ? ?Shannon [name]'s clonidine was increased to 0.3 mg weekly.  Prior to the vaccine her BP was running in the 130s-140s.  She decided to stop taking her clonidine for a few days before she got her vaccine because she did not know how it would affect her.  She had a repeat echocardiogram 10/2019 that revealed LVEF 60 to 65% with mild LVH and grade 1 diastolic dysfunction.  The ascending aorta was normal in size. ? ?Shannon [name] was diagnosed with Stage 1 non-small cell lung cancer.  She was not a surgical candidate.  She completed 5 sessions of XRT. She had recurrent disease with bony metastasis to the spine, ribs and sacrum. She has continued on palliative XRT.  ? ?  At her last appointment, she had significant lower extremity edema after stopping all her blood pressure medications. Lasix and her blood pressure medicine were resumed. ? ?Today, she reports she is very stressed and in a lot of pain. ?She reports "weeping" bilateral LE edema. She is using 20 mg furosemide. She has been watching her food intake and has been juicing a lot. She is taking a lot of pain  medication. She has a hospital bed at this time. ?Her blood pressure is elevated at this visit. She has not taken her medication this morning. ?BP Readings from Last 3 Encounters:  ?11/26/21 (!) 180/97  ?11/20/21 (!) 150/97  ?11/20/21 (!) 168/93  ?  ?She checks her blood pressure at home and systolic ranges from 778-242. She is using 25 mg spironolactone and 240 mg diltiazem to manage her blood pressure. She stopped carvedilol because it was not working. ?She denies chest pain, shortness of breath, palpitations, lightheadedness, headaches, syncope, orthopnea, PND.  ? ?Prior CV studies:   ? ?The following studies were reviewed today: ? ?Echo 01/06/17: ?Study Conclusions ?  ?- Left ventricle: The cavity size was normal. Wall thickness was ?  increased in a pattern of mild LVH. Systolic function was normal. ?  The estimated ejection fraction was in the range of 60% to 65%. ?  Wall motion was normal; there were no regional wall motion ?  abnormalities. Doppler parameters are consistent with abnormal ?  left ventricular relaxation (grade 1 diastolic dysfunction). ?- Aortic valve: Trileaflet; moderately calcified leaflets. ?  Sclerosis without stenosis. There was mild to moderate ?  regurgitation. Mean gradient (S): 8 mm Hg. ?- Mitral valve: There was no significant regurgitation. ?- Left atrium: The atrium was moderately dilated. ?- Right ventricle: The cavity size was normal. Systolic function ?  was normal. ?- Right atrium: The atrium was mildly dilated. ?- Pulmonary arteries: No complete TR doppler jet so unable to ?  estimate PA systolic pressure. ?- Systemic veins: IVC measured 2.2 cm with > 50% respirophasic ?  variation, suggesting RA pressure 8 mmHg. ?  ?Impressions: ?- Normal LV size with mild LV hypertrophy. EF 60-65%. Calcified, ?  trileaflet aortic valve. Sclerosis without significant stenosis, ?  mild to moderate aortic insufficiency. Normal RV size and ?  systolic function. Biatrial enlargement. ? ?Echo  11/15/19: ? 1. Left ventricular ejection fraction, by estimation, is 60 to 65%. The left ventricle has normal function. The left ventricle has no regional wall motion abnormalities. There is mild left ventricular hypertrophy. Left ventricular diastolic parameters  ?are consistent with Grade I diastolic dysfunction (impaired relaxation).  ? 2. Right ventricular systolic function is normal. The right ventricular size is normal. There is normal pulmonary artery systolic pressure. The  ?estimated right ventricular systolic pressure is 35.3 mmHg.  ? 3. The mitral valve is degenerative. No evidence of mitral valve  ?regurgitation. No evidence of mitral stenosis. There is turbulence across the mitral valve but it does not appear restricted and there is no evidence for a significant pressure gradient  ?across it.  ? 4. The aortic valve is tricuspid. Aortic valve regurgitation is trivial.  ?Mild aortic valve sclerosis is present, with no evidence of aortic valve stenosis.  ? 5. The inferior vena cava is normal in size with greater than 50%  ?respiratory variability, suggesting right atrial pressure of 3 mmHg.  ? ?Lower extremity Doppler 03/09/19: ?Tibial vessel disease; one vessel run-off via peroneal artery.  ?   ?Summary:  ?Right: Near normal  examination. No hemodynamically significant arterial  ?stenosis/occlusion noted in the CFA, DFA, SFA, popliteal artery and TPT.  ? ?Tibial vessel disease; zero vessel run-off.  ? ?Left: Near normal examination. No hemodynamically significant arterial  ?stenosis/occlusion noted in the CFA, DFA, SFA, popliteal artery and TPT.  ? ?Tibial vessel disease; one vessel run-off via peroneal artery.  ? ?Past Medical History:  ?Diagnosis Date  ? Anemia   ? Anxiety   ? HX PANIC ATTACKS  ? Arthritis   ? "starting to; in my hands" (07/09/2015)  ? Asthma   ? Atrial fibrillation ([place])   ? Atrial flutter, paroxysmal ([place])   ? Bloated abdomen   ? CFS (chronic fatigue syndrome)   ? Chewing difficulty   ?  Chronic asthma with acute exacerbation   ? "I have chronic asthma all the time; sometimes exacerbations" (07/09/2015)  ? Chronic lower back pain   ? COPD (chronic obstructive pulmonary disease) ([place])   ? Cy

## 2021-11-26 NOTE — Patient Instructions (Addendum)
Medication Instructions:  ?TAKE FUROSEMIDE 40 MG TWICE A DAY FOR 5 DAYS ? ?TAKE POTASSIUM 10 MEQ 4 TABLETS DAILY FOR 5 DAYS ? ?*If you need a refill on your cardiac medications before your next appointment, please call your pharmacy* ? ?Lab Work: ?NONE ? ?Testing/Procedures: ?NONE ? ?Follow-Up: ?At [facility], you and your health needs are our priority.  As part of our continuing mission to provide you with exceptional heart care, we have created designated Provider Care Teams.  These Care Teams include your primary Cardiologist (physician) and Advanced Practice Providers (APPs -  Physician Assistants and Nurse Practitioners) who all work together to provide you with the care you need, when you need it. ? ?We recommend signing up for the patient portal called "MyChart".  Sign up information is provided on this After Visit Summary.  MyChart is used to connect with patients for Virtual Visits (Telemedicine).  Patients are able to view lab/test results, encounter notes, upcoming appointments, etc.  Non-urgent messages can be sent to your provider as well.   ?To learn more about what you can do with MyChart, go to [email or web address].   ? ?Your next appointment:   ?12/03/2021 AT 10:55 AM WITH Overton Mam NP  ?

## 2021-11-26 NOTE — Assessment & Plan Note (Signed)
Currently in sinus rhythm.  Xarelto has been held due to spinal metastasis. ?

## 2021-11-27 ENCOUNTER — Inpatient Hospital Stay (HOSPITAL_BASED_OUTPATIENT_CLINIC_OR_DEPARTMENT_OTHER): Payer: Medicare Other | Admitting: Nurse Practitioner

## 2021-11-27 ENCOUNTER — Inpatient Hospital Stay: Payer: Medicare Other | Attending: Internal Medicine | Admitting: Internal Medicine

## 2021-11-27 ENCOUNTER — Encounter: Payer: Self-pay | Admitting: Internal Medicine

## 2021-11-27 ENCOUNTER — Encounter: Payer: Self-pay | Admitting: Nurse Practitioner

## 2021-11-27 ENCOUNTER — Encounter: Payer: Self-pay | Admitting: *Deleted

## 2021-11-27 ENCOUNTER — Other Ambulatory Visit: Payer: Self-pay

## 2021-11-27 VITALS — BP 157/100 | HR 94 | Temp 93.0°F | Resp 18 | Wt 228.3 lb

## 2021-11-27 DIAGNOSIS — Z9221 Personal history of antineoplastic chemotherapy: Secondary | ICD-10-CM | POA: Diagnosis not present

## 2021-11-27 DIAGNOSIS — C3411 Malignant neoplasm of upper lobe, right bronchus or lung: Secondary | ICD-10-CM | POA: Diagnosis not present

## 2021-11-27 DIAGNOSIS — C7951 Secondary malignant neoplasm of bone: Secondary | ICD-10-CM | POA: Diagnosis not present

## 2021-11-27 DIAGNOSIS — C3490 Malignant neoplasm of unspecified part of unspecified bronchus or lung: Secondary | ICD-10-CM

## 2021-11-27 DIAGNOSIS — G4700 Insomnia, unspecified: Secondary | ICD-10-CM | POA: Insufficient documentation

## 2021-11-27 DIAGNOSIS — R918 Other nonspecific abnormal finding of lung field: Secondary | ICD-10-CM | POA: Insufficient documentation

## 2021-11-27 DIAGNOSIS — Z79899 Other long term (current) drug therapy: Secondary | ICD-10-CM | POA: Diagnosis not present

## 2021-11-27 DIAGNOSIS — Z993 Dependence on wheelchair: Secondary | ICD-10-CM | POA: Insufficient documentation

## 2021-11-27 DIAGNOSIS — M7989 Other specified soft tissue disorders: Secondary | ICD-10-CM | POA: Insufficient documentation

## 2021-11-27 DIAGNOSIS — Z87891 Personal history of nicotine dependence: Secondary | ICD-10-CM | POA: Diagnosis not present

## 2021-11-27 DIAGNOSIS — C3492 Malignant neoplasm of unspecified part of left bronchus or lung: Secondary | ICD-10-CM

## 2021-11-27 DIAGNOSIS — G893 Neoplasm related pain (acute) (chronic): Secondary | ICD-10-CM | POA: Insufficient documentation

## 2021-11-27 DIAGNOSIS — B20 Human immunodeficiency virus [HIV] disease: Secondary | ICD-10-CM | POA: Diagnosis not present

## 2021-11-27 DIAGNOSIS — R5383 Other fatigue: Secondary | ICD-10-CM | POA: Diagnosis not present

## 2021-11-27 DIAGNOSIS — C3412 Malignant neoplasm of upper lobe, left bronchus or lung: Secondary | ICD-10-CM | POA: Diagnosis not present

## 2021-11-27 DIAGNOSIS — Z7189 Other specified counseling: Secondary | ICD-10-CM | POA: Diagnosis not present

## 2021-11-27 DIAGNOSIS — C3491 Malignant neoplasm of unspecified part of right bronchus or lung: Secondary | ICD-10-CM | POA: Diagnosis not present

## 2021-11-27 DIAGNOSIS — Z515 Encounter for palliative care: Secondary | ICD-10-CM | POA: Diagnosis not present

## 2021-11-27 DIAGNOSIS — K219 Gastro-esophageal reflux disease without esophagitis: Secondary | ICD-10-CM | POA: Diagnosis not present

## 2021-11-27 DIAGNOSIS — J449 Chronic obstructive pulmonary disease, unspecified: Secondary | ICD-10-CM | POA: Insufficient documentation

## 2021-11-27 DIAGNOSIS — R530 Neoplastic (malignant) related fatigue: Secondary | ICD-10-CM

## 2021-11-27 LAB — HIV-1 RNA QUANT-NO REFLEX-BLD
HIV 1 RNA Quant: NOT DETECTED Copies/mL
HIV-1 RNA Quant, Log: NOT DETECTED Log cps/mL

## 2021-11-27 NOTE — Progress Notes (Signed)
Oncology Nurse Navigator Documentation ? ? ?  11/27/2021  ? 11:00 AM  ?Oncology Nurse Navigator Flowsheets  ?Navigator Follow Up Date: 12/03/2021  ?Navigator Follow Up Reason: Molecular Testing  ?Navigator Location [facility]  ?Navigator Encounter Type Molecular Studies  ?Patient Visit Type Other  ?Barriers/Navigation Needs Coordination of Care/per Dr. Julien Nordmann, I notified pathology team to send path from 02/13/2020 [phone] for molecular and PDL 1 testing to [facility].   ?Interventions Coordination of Care  ?Acuity Level 2-Minimal Needs (1-2 Barriers Identified)  ?Coordination of Care Pathology  ?Time Spent with Patient 30  ?  ?

## 2021-11-27 NOTE — Progress Notes (Signed)
?    Shannon [name] ?[phone]   [phone] ? ?OFFICE PROGRESS NOTE ? ?Shannon Falcon, MD ?[address] ?[place] [address] ? ?DIAGNOSIS: Recurrent and metastatic non-small cell lung cancer initially diagnosed as stage IA (T1c, N0, M0) non-small cell lung cancer, adenocarcinoma presented with left upper lobe pulmonary nodule diagnosed in June 2021.  The patient has evidence for disease recurrence in February 2023. ? ?PRIOR THERAPY:  ?1) Curative radiotherapy to left upper lobe lung nodules under the care of Dr. Sondra Come completed April 22, 2020. ? ?CURRENT THERAPY: None ? ?INTERVAL HISTORY: ?Shannon [name] 66 y.o. female returns to the clinic today for follow-up visit.  The patient is feeling fine today with no concerning complaints except for mild fatigue.  Her pain is much better controlled.  She underwent CT-guided core biopsy of the posterior right ninth rib by IR and the final pathology was consistent with metastatic adenocarcinoma of the lung.  The patient denied having any current chest pain, shortness of breath, cough or hemoptysis.  She denied having any fever or chills.  She has no nausea, vomiting, diarrhea or constipation.  She is here today for evaluation and discussion of her treatment options based on the new findings.  She has some concern about the way she is treated for HIV and she will discuss her management further with [name] next week. ? ? ?MEDICAL HISTORY: ?Past Medical History:  ?Diagnosis Date  ? Anemia   ? Anxiety   ? HX PANIC ATTACKS  ? Arthritis   ? "starting to; in my hands" (07/09/2015)  ? Asthma   ? Atrial fibrillation ([place])   ? Atrial flutter, paroxysmal ([place])   ? Bloated abdomen   ? CFS (chronic fatigue syndrome)   ? Chewing difficulty   ? Chronic asthma with acute exacerbation   ? "I have chronic asthma all the time; sometimes exacerbations" (07/09/2015)  ? Chronic lower back pain   ? COPD (chronic obstructive pulmonary disease) ([place])   ?  Cyst of right kidney   ? "3 of them; dx'd in ~ 01/2015"  ? Dyspnea   ? GERD (gastroesophageal reflux disease)   ? Heart murmur   ? History of blood transfusion   ? "related to my brain surgery I think"  ? History of pulmonary embolism 07/09/2015  ? History of radiation therapy 04/09/20-04/22/20  ? SBRT Left Lung, Dr. Gery Pray  ? HIV antibody positive ([place])   ? HIV disease ([place])   ? Hyperlipidemia   ? Hypertension   ? Leg edema   ? Lipodystrophy   ? Mild CAD 2013  ? Multiple thyroid nodules   ? Osteopenia   ? Palpitations   ? Pneumonia 07/09/2015  ? Shingles   ? Vitamin B 12 deficiency   ? Vitamin D deficiency   ? ? ?ALLERGIES:  is allergic to lisinopril, tree extract, augmentin [amoxicillin-pot clavulanate], and ciprofloxacin. ? ?MEDICATIONS:  ?Current Outpatient Medications  ?Medication Sig Dispense Refill  ? A&D OINT Apply 1 application. topically daily as needed (rash).    ? albuterol (PROVENTIL) (2.5 MG/3ML) 0.083% nebulizer solution Take 3 mLs (2.5 mg total) by nebulization every 4 (four) hours as needed for wheezing or shortness of breath. 75 mL 2  ? Ascorbic Acid (VITAMIN C) 1000 MG tablet Take 1,000 mg by mouth every other day.     ? b complex vitamins tablet Take 1 tablet by mouth daily.    ? BIKTARVY 50-200-25 MG TABS  tablet TAKE 1 TABLET BY MOUTH DAILY 30 tablet 5  ? BIOTIN PO Take 1 tablet by mouth every other day.    ? Calcium Citrate-Vitamin D (CALCIUM + D PO) Take 1 tablet by mouth every other day.    ? CALCIUM PO Take 1 tablet by mouth daily.    ? cyanocobalamin (,VITAMIN B-12,) 1000 MCG/ML injection ADMINISTER 1 ML(1000 MCG) IN THE MUSCLE EVERY 30 DAYS 3 mL 1  ? dexamethasone (DECADRON) 4 MG tablet Take 1 tablet (4 mg total) by mouth 2 (two) times daily with a meal. 30 tablet 0  ? diltiazem (CARDIZEM CD) 240 MG 24 hr capsule TAKE 1 CAPSULE(240 MG) BY MOUTH DAILY 90 capsule 3  ? famotidine (PEPCID) 40 MG tablet Take 1 tablet (40 mg total) by mouth daily. 30 tablet 11  ? furosemide (LASIX) 20 MG  tablet TAKE 2 TABLETS BY MOUTH TWICE A DAY FOR 5 DAYS THEN AS DIRECTED BY PHYSICIAN 60 tablet 0  ? magnesium hydroxide (MILK OF MAGNESIA) 400 MG/5ML suspension Take 15 mLs by mouth daily as needed for mild constipation.    ? Melatonin 5 MG TABS Take 5 mg by mouth at bedtime as needed (for sleep).    ? Misc. Devices (PULSE OXIMETER FOR FINGER) MISC 1 Units by Does not apply route as needed. 1 each 0  ? morphine (MS CONTIN) 15 MG 12 hr tablet Take 1 tablet by mouth every 12 hours. 30 tablet 0  ? Mouthwashes (BIOTENE DRY MOUTH GENTLE) LIQD Use as directed 1 Dose in the mouth or throat 2 (two) times daily as needed (dry mouth).    ? Naphazoline HCl (CLEAR EYES OP) Place 1 drop into both eyes daily as needed (redness).    ? neomycin-bacitracin-polymyxin (NEOSPORIN) ointment Apply 1 application. topically as needed for wound care.    ? Olopatadine HCl (PATADAY OP) Place 1 drop into both eyes daily as needed (burning).    ? ondansetron (ZOFRAN) 4 MG tablet Take 1 tablet (4 mg total) by mouth every 8 (eight) hours as needed for nausea or vomiting. 20 tablet 0  ? Oxycodone HCl 10 MG TABS Take 10 mg by mouth every 6 (six) hours as needed (breakthrough pain).    ? OXYGEN Inhale 1.5-2 L into the lungs See admin instructions. Uses at bedtime and through the day as needed    ? Phenylephrine-Cocoa Butter (PREPARATION H RE) Place 1 application. rectally daily as needed (hemorrhoids).    ? Polyethyl Glycol-Propyl Glycol (SYSTANE OP) Place 1 drop into both eyes daily as needed (dry eyes).    ? potassium chloride (KLOR-CON) 10 MEQ tablet TAKE 4 TABLETS BY MOUTH DAILY FOR 5 DAYS ONLY 30 tablet 0  ? spironolactone (ALDACTONE) 25 MG tablet Take 1 tablet (25 mg total) by mouth daily. 90 tablet 0  ? SYRINGE/NEEDLE, DISP, 1 ML (B-D SYRINGE/NEEDLE 1CC/25GX5/8) 25G X 5/8" 1 ML MISC 1 Units by Does not apply route every 30 (thirty) days. 50 each 0  ? XARELTO 20 MG TABS tablet TAKE 1 TABLET(20 MG) BY MOUTH DAILY (Patient not taking: Reported on  11/05/2021) 90 tablet 0  ? ?No current facility-administered medications for this visit.  ? ? ?SURGICAL HISTORY:  ?Past Surgical History:  ?Procedure Laterality Date  ? ABDOMINAL HYSTERECTOMY    ? "robotic laparosopic"  ? BRAIN SURGERY  1974  ? "brain tumor; benign; on top of my brain; got a plate in there"  ? BRAIN SURGERY    ? age 48- -"Tumor pushing  my skullout"  ? BRONCHIAL BIOPSY  02/13/2020  ? Procedure: BRONCHIAL BIOPSIES;  Surgeon: [name], MD;  Location: [facility] ENDOSCOPY;  Service: Pulmonary;;  ? BRONCHIAL BRUSHINGS  02/13/2020  ? Procedure: BRONCHIAL BRUSHINGS;  Surgeon: [name], MD;  Location: [facility] ENDOSCOPY;  Service: Pulmonary;;  ? BRONCHIAL NEEDLE ASPIRATION BIOPSY  02/13/2020  ? Procedure: BRONCHIAL NEEDLE ASPIRATION BIOPSIES;  Surgeon: [name], MD;  Location: [facility] ENDOSCOPY;  Service: Pulmonary;;  ? CARDIAC CATHETERIZATION    ? FIDUCIAL MARKER PLACEMENT  02/13/2020  ? Procedure: FIDUCIAL MARKER PLACEMENT;  Surgeon: [name], MD;  Location: [facility] ENDOSCOPY;  Service: Pulmonary;;  ? TONSILLECTOMY AND ADENOIDECTOMY    ? VIDEO BRONCHOSCOPY WITH ENDOBRONCHIAL NAVIGATION N/A 02/13/2020  ? Procedure: VIDEO BRONCHOSCOPY WITH ENDOBRONCHIAL NAVIGATION;  Surgeon: [name], MD;  Location: [facility] ENDOSCOPY;  Service: Pulmonary;  Laterality: N/A;  ? ? ?REVIEW OF SYSTEMS:  Constitutional: positive for fatigue ?Eyes: negative ?Ears, nose, mouth, throat, and face: negative ?Respiratory: negative ?Cardiovascular: negative ?Gastrointestinal: negative ?Genitourinary:negative ?Integument/breast: negative ?Hematologic/lymphatic: negative ?Musculoskeletal:positive for bone pain ?Neurological: negative ?Behavioral/Psych: positive for sleep disturbance ?Endocrine: negative ?Allergic/Immunologic: negative  ? ?PHYSICAL EXAMINATION: General appearance: alert, cooperative, fatigued, and no distress ?Head: Normocephalic, without obvious abnormality, atraumatic ?Neck: no adenopathy, no JVD, supple, symmetrical,  trachea midline, and thyroid not enlarged, symmetric, no tenderness/mass/nodules ?Lymph nodes: Cervical, supraclavicular, and axillary nodes normal. ?Resp: clear to auscultation bilaterally ?Back: symmetric

## 2021-11-27 NOTE — Progress Notes (Signed)
? ?  ?Palliative Medicine ?[place]  ?[phone] [phone] ? ? ?Name: Shannon [name] ?Date: 11/27/2021 ?MRN: [id number]  ?DOB: Jul 23, 1956 ? ?Patient Care Team: ?[name], MD as PCP - General (Internal Medicine) ?Skeet Latch, MD as PCP - Cardiology (Cardiology) ?Campbell Riches, MD as Consulting Physician (Infectious Diseases) ?[place] ?[name], RN as [place] [facility] ?Valrie Hart, RN as Oncology Nurse Navigator (Oncology)  ? ? ?REASON FOR CONSULTATION: ?Shannon [name] is a 66 y.o. female with medical history including metastatic recurrent non-small cell lung cancer (01/2020) s/p curative radiotherapy (03/2020), with recurrence in February 2023, asthma, anxiety, COPD, GERD, hypertension, hyperlipidemia, lower extremity edema, and HIV.  Palliative ask to see for symptom management and goals of care.  ? ? ?SOCIAL HISTORY:    ? reports that she has quit smoking. Her smoking use included cigarettes. She has never used smokeless tobacco. She reports that she does not currently use alcohol. She reports that she does not use drugs. ? ?ADVANCE DIRECTIVES:  ? ? ?CODE STATUS:  ? ?PAST MEDICAL HISTORY: ?Past Medical History:  ?Diagnosis Date  ? Anemia   ? Anxiety   ? HX PANIC ATTACKS  ? Arthritis   ? "starting to; in my hands" (07/09/2015)  ? Asthma   ? Atrial fibrillation ([place])   ? Atrial flutter, paroxysmal ([place])   ? Bloated abdomen   ? CFS (chronic fatigue syndrome)   ? Chewing difficulty   ? Chronic asthma with acute exacerbation   ? "I have chronic asthma all the time; sometimes exacerbations" (07/09/2015)  ? Chronic lower back pain   ? COPD (chronic obstructive pulmonary disease) ([place])   ? Cyst of right kidney   ? "3 of them; dx'd in ~ 01/2015"  ? Dyspnea   ? GERD (gastroesophageal reflux disease)   ? Heart murmur   ? History of blood transfusion   ? "related to my brain surgery I think"  ? History of  pulmonary embolism 07/09/2015  ? History of radiation therapy 04/09/20-04/22/20  ? SBRT Left Lung, Dr. Gery Pray  ? HIV antibody positive ([place])   ? HIV disease ([place])   ? Hyperlipidemia   ? Hypertension   ? Leg edema   ? Lipodystrophy   ? Mild CAD 2013  ? Multiple thyroid nodules   ? Osteopenia   ? Palpitations   ? Pneumonia 07/09/2015  ? Shingles   ? Vitamin B 12 deficiency   ? Vitamin D deficiency   ? ? ?PAST SURGICAL HISTORY:  ?Past Surgical History:  ?Procedure Laterality Date  ? ABDOMINAL HYSTERECTOMY    ? "robotic laparosopic"  ? BRAIN SURGERY  1974  ? "brain tumor; benign; on top of my brain; got a plate in there"  ? BRAIN SURGERY    ? age 13- -"Tumor pushing my skullout"  ? BRONCHIAL BIOPSY  02/13/2020  ? Procedure: BRONCHIAL BIOPSIES;  Surgeon: [name], MD;  Location: [facility] ENDOSCOPY;  Service: Pulmonary;;  ? BRONCHIAL BRUSHINGS  02/13/2020  ? Procedure: BRONCHIAL BRUSHINGS;  Surgeon: [name], MD;  Location: [facility] ENDOSCOPY;  Service: Pulmonary;;  ? BRONCHIAL NEEDLE ASPIRATION BIOPSY  02/13/2020  ? Procedure: BRONCHIAL NEEDLE ASPIRATION BIOPSIES;  Surgeon: [name], MD;  Location: [facility] ENDOSCOPY;  Service: Pulmonary;;  ? CARDIAC CATHETERIZATION    ? FIDUCIAL MARKER PLACEMENT  02/13/2020  ? Procedure: FIDUCIAL MARKER PLACEMENT;  Surgeon: [name], MD;  Location: [facility] ENDOSCOPY;  Service: Pulmonary;;  ? TONSILLECTOMY AND ADENOIDECTOMY    ? VIDEO BRONCHOSCOPY WITH ENDOBRONCHIAL NAVIGATION N/A 02/13/2020  ? Procedure: VIDEO BRONCHOSCOPY WITH ENDOBRONCHIAL NAVIGATION;  Surgeon: [name], MD;  Location: [facility] ENDOSCOPY;  Service: Pulmonary;  Laterality: N/A;  ? ? ?HEMATOLOGY/ONCOLOGY HISTORY:  ?Oncology History  ? No history exists.  ? ? ?ALLERGIES:  is allergic to lisinopril, tree extract, augmentin [amoxicillin-pot clavulanate], and ciprofloxacin. ? ?MEDICATIONS:  ?Current Outpatient Medications  ?Medication Sig Dispense Refill  ? A&D OINT Apply 1 application. topically daily as needed  (rash).    ? albuterol (PROVENTIL) (2.5 MG/3ML) 0.083% nebulizer solution Take 3 mLs (2.5 mg total) by nebulization every 4 (four) hours as needed for wheezing or shortness of breath. 75 mL 2  ? Ascorbic Acid (VITAMIN C) 1000 MG tablet Take 1,000 mg by mouth every other day.     ? b complex vitamins tablet Take 1 tablet by mouth daily.    ? BIKTARVY 50-200-25 MG TABS tablet TAKE 1 TABLET BY MOUTH DAILY 30 tablet 5  ? BIOTIN PO Take 1 tablet by mouth every other day.    ? Calcium Citrate-Vitamin D (CALCIUM + D PO) Take 1 tablet by mouth every other day.    ? CALCIUM PO Take 1 tablet by mouth daily.    ? cyanocobalamin (,VITAMIN B-12,) 1000 MCG/ML injection ADMINISTER 1 ML(1000 MCG) IN THE MUSCLE EVERY 30 DAYS 3 mL 1  ? dexamethasone (DECADRON) 4 MG tablet Take 1 tablet (4 mg total) by mouth 2 (two) times daily with a meal. 30 tablet 0  ? diltiazem (CARDIZEM CD) 240 MG 24 hr capsule TAKE 1 CAPSULE(240 MG) BY MOUTH DAILY 90 capsule 3  ? famotidine (PEPCID) 40 MG tablet Take 1 tablet (40 mg total) by mouth daily. 30 tablet 11  ? furosemide (LASIX) 20 MG tablet TAKE 2 TABLETS BY MOUTH TWICE A DAY FOR 5 DAYS THEN AS DIRECTED BY PHYSICIAN 60 tablet 0  ? magnesium hydroxide (MILK OF MAGNESIA) 400 MG/5ML suspension Take 15 mLs by mouth daily as needed for mild constipation.    ? Melatonin 5 MG TABS Take 5 mg by mouth at bedtime as needed (for sleep).    ? Misc. Devices (PULSE OXIMETER FOR FINGER) MISC 1 Units by Does not apply route as needed. 1 each 0  ? morphine (MS CONTIN) 15 MG 12 hr tablet Take 1 tablet by mouth every 12 hours. (Patient not taking: Reported on 11/27/2021) 30 tablet 0  ? Mouthwashes (BIOTENE DRY MOUTH GENTLE) LIQD Use as directed 1 Dose in the mouth or throat 2 (two) times daily as needed (dry mouth).    ? Naphazoline HCl (CLEAR EYES OP) Place 1 drop into both eyes daily as needed (redness).    ? neomycin-bacitracin-polymyxin (NEOSPORIN) ointment Apply 1 application. topically as needed for wound care.     ? Olopatadine HCl (PATADAY OP) Place 1 drop into both eyes daily as needed (burning).    ? ondansetron (ZOFRAN) 4 MG tablet Take 1 tablet (4 mg total) by mouth every 8 (eight) hours as needed for nausea or vomiting. (Patient not taking: Reported on 11/27/2021) 20 tablet 0  ? Oxycodone HCl 10 MG TABS Take 10 mg by mouth every 6 (six) hours as needed (breakthrough pain).    ? OXYGEN Inhale 1.5-2 L into the lungs See admin instructions. Uses at bedtime and through the day as needed (Patient not taking: Reported on 11/27/2021)    ? Phenylephrine-Cocoa Butter (PREPARATION H RE) Place  1 application. rectally daily as needed (hemorrhoids).    ? Polyethyl Glycol-Propyl Glycol (SYSTANE OP) Place 1 drop into both eyes daily as needed (dry eyes).    ? potassium chloride (KLOR-CON) 10 MEQ tablet TAKE 4 TABLETS BY MOUTH DAILY FOR 5 DAYS ONLY 30 tablet 0  ? spironolactone (ALDACTONE) 25 MG tablet Take 1 tablet (25 mg total) by mouth daily. 90 tablet 0  ? SYRINGE/NEEDLE, DISP, 1 ML (B-D SYRINGE/NEEDLE 1CC/25GX5/8) 25G X 5/8" 1 ML MISC 1 Units by Does not apply route every 30 (thirty) days. 50 each 0  ? XARELTO 20 MG TABS tablet TAKE 1 TABLET(20 MG) BY MOUTH DAILY 90 tablet 0  ? ?No current facility-administered medications for this visit.  ? ? ?VITAL SIGNS: ?There were no vitals taken for this visit. ?There were no vitals filed for this visit.  ?Estimated body mass index is 39.81 kg/m? as calculated from the following: ?  Height as of 11/26/21: 5' 3.5" (1.613 m). ?  Weight as of an earlier encounter on 11/27/21: 228 lb 5 oz (103.6 kg). ? ?LABS: ?CBC: ?   ?Component Value Date/Time  ? WBC 8.8 11/20/2021 0830  ? HGB 14.1 11/20/2021 0830  ? HGB 12.8 11/06/2021 0938  ? HGB 12.2 04/14/2021 1112  ? HCT 44.2 11/20/2021 0830  ? HCT 36.7 04/14/2021 1112  ? PLT 152 11/20/2021 0830  ? PLT 145 (L) 11/06/2021 6333  ? PLT 229 04/14/2021 1112  ? MCV 99.5 11/20/2021 0830  ? MCV 92 04/14/2021 1112  ? NEUTROABS 6.3 11/10/2021 1108  ? NEUTROABS 4.3  04/14/2021 1112  ? LYMPHSABS 0.5 (L) 11/10/2021 1108  ? LYMPHSABS 1.1 04/14/2021 1112  ? MONOABS 0.5 11/10/2021 1108  ? EOSABS 0.1 11/10/2021 1108  ? EOSABS 0.1 04/14/2021 1112  ? BASOSABS 0.0 11/10/2021 1108  ? BASOSAB

## 2021-11-28 ENCOUNTER — Encounter: Payer: Self-pay | Admitting: Radiology

## 2021-12-01 ENCOUNTER — Telehealth: Payer: Self-pay | Admitting: Nurse Practitioner

## 2021-12-01 ENCOUNTER — Other Ambulatory Visit: Payer: Self-pay | Admitting: Internal Medicine

## 2021-12-01 ENCOUNTER — Telehealth (HOSPITAL_BASED_OUTPATIENT_CLINIC_OR_DEPARTMENT_OTHER): Payer: Self-pay | Admitting: Cardiovascular Disease

## 2021-12-01 DIAGNOSIS — J4551 Severe persistent asthma with (acute) exacerbation: Secondary | ICD-10-CM

## 2021-12-01 NOTE — Telephone Encounter (Signed)
-  Pt called to report an ulcer on the top of her right foot. ?-She report it is dark purple/black and very painful ?-Nurse recommended pt contact pcp or go to urgent care for evaluations. ?-Pt verbalized understanding.   ? ? ?

## 2021-12-01 NOTE — Telephone Encounter (Signed)
Per 4/10 inbasket called pt and rescheduled appointment.  Pt confirmed appointment  ?

## 2021-12-01 NOTE — Telephone Encounter (Signed)
Called patient to confirm Wednesday 12/03/21 appointment.  Patient states the top of her foot is very swollen and painful.  Please call ?

## 2021-12-02 ENCOUNTER — Encounter (HOSPITAL_COMMUNITY): Payer: Self-pay | Admitting: Emergency Medicine

## 2021-12-02 ENCOUNTER — Emergency Department (HOSPITAL_COMMUNITY): Payer: Medicare Other

## 2021-12-02 ENCOUNTER — Emergency Department (HOSPITAL_BASED_OUTPATIENT_CLINIC_OR_DEPARTMENT_OTHER): Payer: Medicare Other

## 2021-12-02 ENCOUNTER — Other Ambulatory Visit: Payer: Self-pay

## 2021-12-02 ENCOUNTER — Encounter (HOSPITAL_COMMUNITY): Payer: Self-pay | Admitting: Internal Medicine

## 2021-12-02 ENCOUNTER — Encounter: Payer: Self-pay | Admitting: *Deleted

## 2021-12-02 ENCOUNTER — Emergency Department (HOSPITAL_COMMUNITY)
Admission: EM | Admit: 2021-12-02 | Discharge: 2021-12-02 | Disposition: A | Discharge status: Home / Self Care | Payer: Medicare Other | Attending: Emergency Medicine | Admitting: Emergency Medicine

## 2021-12-02 ENCOUNTER — Inpatient Hospital Stay: Payer: Medicare Other | Admitting: Nurse Practitioner

## 2021-12-02 DIAGNOSIS — T25221A Burn of second degree of right foot, initial encounter: Secondary | ICD-10-CM | POA: Insufficient documentation

## 2021-12-02 DIAGNOSIS — M79604 Pain in right leg: Secondary | ICD-10-CM | POA: Diagnosis not present

## 2021-12-02 DIAGNOSIS — I10 Essential (primary) hypertension: Secondary | ICD-10-CM | POA: Diagnosis not present

## 2021-12-02 DIAGNOSIS — I4891 Unspecified atrial fibrillation: Secondary | ICD-10-CM | POA: Insufficient documentation

## 2021-12-02 DIAGNOSIS — R61 Generalized hyperhidrosis: Secondary | ICD-10-CM | POA: Diagnosis not present

## 2021-12-02 DIAGNOSIS — J449 Chronic obstructive pulmonary disease, unspecified: Secondary | ICD-10-CM | POA: Diagnosis not present

## 2021-12-02 DIAGNOSIS — M79661 Pain in right lower leg: Secondary | ICD-10-CM | POA: Diagnosis not present

## 2021-12-02 DIAGNOSIS — Z85118 Personal history of other malignant neoplasm of bronchus and lung: Secondary | ICD-10-CM | POA: Diagnosis not present

## 2021-12-02 DIAGNOSIS — Z20822 Contact with and (suspected) exposure to covid-19: Secondary | ICD-10-CM | POA: Diagnosis not present

## 2021-12-02 DIAGNOSIS — Z7951 Long term (current) use of inhaled steroids: Secondary | ICD-10-CM | POA: Insufficient documentation

## 2021-12-02 DIAGNOSIS — R0902 Hypoxemia: Secondary | ICD-10-CM | POA: Diagnosis not present

## 2021-12-02 DIAGNOSIS — Z79899 Other long term (current) drug therapy: Secondary | ICD-10-CM | POA: Diagnosis not present

## 2021-12-02 DIAGNOSIS — M7989 Other specified soft tissue disorders: Secondary | ICD-10-CM | POA: Insufficient documentation

## 2021-12-02 DIAGNOSIS — Y9289 Other specified places as the place of occurrence of the external cause: Secondary | ICD-10-CM | POA: Insufficient documentation

## 2021-12-02 DIAGNOSIS — Z7901 Long term (current) use of anticoagulants: Secondary | ICD-10-CM | POA: Insufficient documentation

## 2021-12-02 DIAGNOSIS — R7989 Other specified abnormal findings of blood chemistry: Secondary | ICD-10-CM | POA: Diagnosis not present

## 2021-12-02 DIAGNOSIS — R0602 Shortness of breath: Secondary | ICD-10-CM | POA: Diagnosis not present

## 2021-12-02 DIAGNOSIS — I499 Cardiac arrhythmia, unspecified: Secondary | ICD-10-CM | POA: Diagnosis not present

## 2021-12-02 DIAGNOSIS — R609 Edema, unspecified: Secondary | ICD-10-CM | POA: Diagnosis not present

## 2021-12-02 DIAGNOSIS — R2241 Localized swelling, mass and lump, right lower limb: Secondary | ICD-10-CM | POA: Insufficient documentation

## 2021-12-02 DIAGNOSIS — Z743 Need for continuous supervision: Secondary | ICD-10-CM | POA: Diagnosis not present

## 2021-12-02 DIAGNOSIS — X58XXXA Exposure to other specified factors, initial encounter: Secondary | ICD-10-CM | POA: Diagnosis not present

## 2021-12-02 LAB — CBC WITH DIFFERENTIAL/PLATELET
Abs Immature Granulocytes: 0.07 10*3/uL (ref 0.00–0.07)
Basophils Absolute: 0 10*3/uL (ref 0.0–0.1)
Basophils Relative: 0 %
Eosinophils Absolute: 0 10*3/uL (ref 0.0–0.5)
Eosinophils Relative: 0 %
HCT: 43.8 % (ref 36.0–46.0)
Hemoglobin: 13.7 g/dL (ref 12.0–15.0)
Immature Granulocytes: 1 %
Lymphocytes Relative: 6 %
Lymphs Abs: 0.7 10*3/uL (ref 0.7–4.0)
MCH: 31.3 pg (ref 26.0–34.0)
MCHC: 31.3 g/dL (ref 30.0–36.0)
MCV: 100 fL (ref 80.0–100.0)
Monocytes Absolute: 0.4 10*3/uL (ref 0.1–1.0)
Monocytes Relative: 3 %
Neutro Abs: 9.6 10*3/uL — ABNORMAL HIGH (ref 1.7–7.7)
Neutrophils Relative %: 90 %
Platelets: 137 10*3/uL — ABNORMAL LOW (ref 150–400)
RBC: 4.38 MIL/uL (ref 3.87–5.11)
RDW: 13.8 % (ref 11.5–15.5)
WBC: 10.7 10*3/uL — ABNORMAL HIGH (ref 4.0–10.5)
nRBC: 0 % (ref 0.0–0.2)

## 2021-12-02 LAB — HEPATIC FUNCTION PANEL
ALT: 36 U/L (ref 0–44)
AST: 22 U/L (ref 15–41)
Albumin: 3.3 g/dL — ABNORMAL LOW (ref 3.5–5.0)
Alkaline Phosphatase: 76 U/L (ref 38–126)
Bilirubin, Direct: 0.1 mg/dL (ref 0.0–0.2)
Indirect Bilirubin: 0.9 mg/dL (ref 0.3–0.9)
Total Bilirubin: 1 mg/dL (ref 0.3–1.2)
Total Protein: 6.4 g/dL — ABNORMAL LOW (ref 6.5–8.1)

## 2021-12-02 LAB — BASIC METABOLIC PANEL
Anion gap: 10 (ref 5–15)
BUN: 34 mg/dL — ABNORMAL HIGH (ref 8–23)
CO2: 28 mmol/L (ref 22–32)
Calcium: 10.1 mg/dL (ref 8.9–10.3)
Chloride: 99 mmol/L (ref 98–111)
Creatinine, Ser: 1.04 mg/dL — ABNORMAL HIGH (ref 0.44–1.00)
GFR, Estimated: 59 mL/min — ABNORMAL LOW (ref >60)
Glucose, Bld: 173 mg/dL — ABNORMAL HIGH (ref 70–99)
Potassium: 4.4 mmol/L (ref 3.5–5.1)
Sodium: 137 mmol/L (ref 135–145)

## 2021-12-02 LAB — T4, FREE: Free T4: 1.4 ng/dL — ABNORMAL HIGH (ref 0.61–1.12)

## 2021-12-02 LAB — TROPONIN I (HIGH SENSITIVITY)
Troponin I (High Sensitivity): 68 ng/L — ABNORMAL HIGH (ref <18)
Troponin I (High Sensitivity): 64 ng/L — ABNORMAL HIGH (ref <18)

## 2021-12-02 LAB — RESP PANEL BY RT-PCR (FLU A&B, COVID) ARPGX2
Influenza A by PCR: NEGATIVE
Influenza B by PCR: NEGATIVE
SARS Coronavirus 2 by RT PCR: NEGATIVE

## 2021-12-02 LAB — TSH: TSH: 0.053 u[IU]/mL — ABNORMAL LOW (ref 0.350–4.500)

## 2021-12-02 LAB — LACTIC ACID, PLASMA: Lactic Acid, Venous: 1.1 mmol/L (ref 0.5–1.9)

## 2021-12-02 LAB — D-DIMER, QUANTITATIVE: D-Dimer, Quant: 1.28 ug/mL-FEU — ABNORMAL HIGH (ref 0.00–0.50)

## 2021-12-02 LAB — MAGNESIUM: Magnesium: 1.9 mg/dL (ref 1.7–2.4)

## 2021-12-02 LAB — BRAIN NATRIURETIC PEPTIDE: B Natriuretic Peptide: 243 pg/mL — ABNORMAL HIGH (ref 0.0–100.0)

## 2021-12-02 MED ORDER — RIVAROXABAN 20 MG PO TABS
20.0000 mg | ORAL_TABLET | Freq: Every day | ORAL | Status: DC
  Administered 2021-12-02: 20 mg via ORAL
  Filled 2021-12-02: qty 1

## 2021-12-02 MED ORDER — FUROSEMIDE 10 MG/ML IJ SOLN
20.0000 mg | Freq: Once | INTRAMUSCULAR | Status: AC
  Administered 2021-12-02: 20 mg via INTRAVENOUS
  Filled 2021-12-02: qty 4

## 2021-12-02 MED ORDER — BACITRACIN ZINC 500 UNIT/GM EX OINT
TOPICAL_OINTMENT | Freq: Two times a day (BID) | CUTANEOUS | Status: DC
  Filled 2021-12-02 (×2): qty 0.9

## 2021-12-02 MED ORDER — DILTIAZEM HCL ER COATED BEADS 120 MG PO CP24
240.0000 mg | ORAL_CAPSULE | Freq: Every day | ORAL | Status: DC
  Administered 2021-12-02: 240 mg via ORAL
  Filled 2021-12-02: qty 2

## 2021-12-02 MED ORDER — IOHEXOL 350 MG/ML SOLN
100.0000 mL | Freq: Once | INTRAVENOUS | Status: AC | PRN
Start: 2021-12-02 — End: 2021-12-02
  Administered 2021-12-02: 100 mL via INTRAVENOUS

## 2021-12-02 MED ORDER — SODIUM CHLORIDE (PF) 0.9 % IJ SOLN
INTRAMUSCULAR | Status: AC
  Filled 2021-12-02: qty 50

## 2021-12-02 NOTE — Discharge Instructions (Addendum)
PLEASE TAKE ALL OF YOUR PRESCRIBED MEDICATIONS.  ? ?Your work-up today was quite reassuring.  You do not have a blood clot ?However you will need to take your blood thinners as they are prescribed.  Please follow-up with your primary care provider.  You had some abnormal thyroid levels you will need to have this reevaluated by your primary care team. ?

## 2021-12-02 NOTE — ED Triage Notes (Signed)
Pt. BIB [facility] c/o SOB that has been getting worse for 3 days. Has a hx of lung cancer and asthma. Pt. Has not been able to take asthma meds.Pt. also has swelling to the R. Leg x1 day. ? ?EMS VS:  ?HR: 110 ?BP: 150/90 ?TEMP: 99.7 ?CBG: 189 ?

## 2021-12-02 NOTE — Progress Notes (Signed)
RLE venous duplex has been completed.  Exam is limited due to patient condition (pain intolerance).  Preliminary results given to Dr. Francia Greaves via secure chat. ? ? ?Results can be found under chart review under CV PROC. ?12/02/2021 11:43 AM ?Ileigh Mettler RVT, RDMS ? ?
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
## 2021-12-02 NOTE — ED Notes (Addendum)
When patient was made she would be moved to the hall bed to await PTAR, she became very upset and stated she was not going to a hall bed. Abby, charge number and this Probation officer in to speak with patient. Made her aware that she has been waiting in the room for discharge for 2.5 hrs but now we are getting much busier and need the room for patient with medical needs.  Patient stated that she is call someone to come get her. However, when asked if that person was bringing oxygen she refused to answer my question, instead asking why I was asking her that.  I let her know that is was not a good idea to ride home without oxygen and that that is the reason we called PTAR for her.  Patient stated that she is going to be dressed and go to the lobby.  She is refusing oxygen while in the lobby. Beverley Fiedler, PA made aware, per [facility] patient is non-compliant. ?

## 2021-12-02 NOTE — Progress Notes (Signed)
Pt unable to tolerate bipap 10/5 35%.  PA aware, at bedside. ?

## 2021-12-02 NOTE — ED Provider Notes (Signed)
?[facility] DEPT ?Provider Note ? ? ?CSN: [id number] ?Arrival date & time: 12/02/21  0139 ? ?  ? ?History ? ?Chief Complaint  ?Patient presents with  ? Shortness of Breath  ? ? ?Shannon [name] is a 66 y.o. female. ? ? ?Shortness of Breath ? ?Patient is a 66 year old female with past medical history notable for HTN, reflux, anxiety, chronic low back pain, history of PE on Xarelto, A-fib, COPD, pulmonary hypertension on chronic 2 L nasal cannula oxygen, HIV on Biktarvy, lung cancer with palliative radiation therapy, chronic fatigue syndrome, leg edema ? ?Patient is presenting to the emergency room today with complaints of shortness of breath.  She states that she is chronically short of breath and wears oxygen 2 L at baseline however for the past 2 weeks she is developing some right lower extremity swelling and also been feeling more short of breath.  She states that she often forgets to take her medications including her Lasix.  She denies any chest pain but states that she feels that she is not breathing as well.  She has had a brief harder.  She is not coughing she has not had any fevers she denies any hemoptysis.  She is also inconsistently taking her Xarelto which she is prescribed.  ? ? ?  ? ?Home Medications ?Prior to Admission medications   ?Medication Sig Start Date End Date Taking? Authorizing Provider  ?A&D OINT Apply 1 application. topically daily as needed (rash).    [provider]  ?albuterol (PROVENTIL) (2.5 MG/3ML) 0.083% nebulizer solution Take 3 mLs (2.5 mg total) by nebulization every 4 (four) hours as needed for wheezing or shortness of breath. 04/16/21 04/16/22  Madalyn Rob, MD  ?Ascorbic Acid (VITAMIN C) 1000 MG tablet Take 1,000 mg by mouth every other day.     [provider]  ?b complex vitamins tablet Take 1 tablet by mouth daily.    [provider]  ?BIKTARVY 50-200-25 MG TABS tablet TAKE 1 TABLET BY MOUTH DAILY 09/01/21   [name], MD  ?BIOTIN PO Take 1 tablet by mouth every other day.    [provider]  ?Calcium Citrate-Vitamin D (CALCIUM + D PO) Take 1 tablet by mouth every other day.    [provider]  ?CALCIUM PO Take 1 tablet by mouth daily.    [provider]  ?cyanocobalamin (,VITAMIN B-12,) 1000 MCG/ML injection ADMINISTER 1 ML(1000 MCG) IN THE MUSCLE EVERY 30 DAYS 11/12/21   [name], MD  ?dexamethasone (DECADRON) 4 MG tablet Take 1 tablet (4 mg total) by mouth 2 (two) times daily with a meal. 11/12/21   [name], MD  ?diltiazem (CARDIZEM CD) 240 MG 24 hr capsule TAKE 1 CAPSULE(240 MG) BY MOUTH DAILY 07/12/21   [name], MD  ?famotidine (PEPCID) 40 MG tablet Take 1 tablet (40 mg total) by mouth daily. 09/04/21   Esterwood, Amy S, PA-C  ?furosemide (LASIX) 20 MG tablet TAKE 2 TABLETS BY MOUTH TWICE A DAY FOR 5 DAYS THEN AS DIRECTED BY PHYSICIAN 11/26/21   Skeet Latch, MD  ?magnesium hydroxide (MILK OF MAGNESIA) 400 MG/5ML suspension Take 15 mLs by mouth daily as needed for mild constipation.    [provider]  ?Melatonin 5 MG TABS Take 5 mg by mouth at bedtime as needed (for sleep).    [provider]  ?Misc. Devices (PULSE OXIMETER FOR FINGER) MISC 1 Units by Does not apply route as needed. 01/19/19  Skeet Latch, MD  ?morphine (MS CONTIN) 15 MG 12 hr tablet Take 1 tablet by mouth every 12 hours. ?Patient not taking: Reported on 11/27/2021 10/15/21   Gery Pray, MD  ?Mouthwashes (BIOTENE DRY MOUTH GENTLE) LIQD Use as directed 1 Dose in the mouth or throat 2 (two) times daily as needed (dry mouth). 02/13/20   [name], MD  ?Naphazoline HCl (CLEAR EYES OP) Place 1 drop into both eyes daily as needed (redness).    [provider]  ?neomycin-bacitracin-polymyxin (NEOSPORIN) ointment Apply 1 application. topically as needed for wound care.    [provider]  ?Olopatadine HCl (PATADAY OP) Place 1 drop into both eyes daily as needed  (burning).    [provider]  ?ondansetron (ZOFRAN) 4 MG tablet Take 1 tablet (4 mg total) by mouth every 8 (eight) hours as needed for nausea or vomiting. ?Patient not taking: Reported on 11/27/2021 10/15/21   Gery Pray, MD  ?Oxycodone HCl 10 MG TABS Take 10 mg by mouth every 6 (six) hours as needed (breakthrough pain). 09/30/21   [provider]  ?OXYGEN Inhale 1.5-2 L into the lungs See admin instructions. Uses at bedtime and through the day as needed ?Patient not taking: Reported on 11/27/2021    [provider]  ?Phenylephrine-Cocoa Butter (PREPARATION H RE) Place 1 application. rectally daily as needed (hemorrhoids).    [provider]  ?Polyethyl Glycol-Propyl Glycol (SYSTANE OP) Place 1 drop into both eyes daily as needed (dry eyes).    [provider]  ?potassium chloride (KLOR-CON) 10 MEQ tablet TAKE 4 TABLETS BY MOUTH DAILY FOR 5 DAYS ONLY 11/26/21   Skeet Latch, MD  ?spironolactone (ALDACTONE) 25 MG tablet Take 1 tablet (25 mg total) by mouth daily. 08/28/21 11/26/21  Mitzi Hansen, MD  ?SYRINGE/NEEDLE, DISP, 1 ML (B-D SYRINGE/NEEDLE 1CC/25GX5/8) 25G X 5/8" 1 ML MISC 1 Units by Does not apply route every 30 (thirty) days. 04/14/19   [name], MD  ?Alveda Reasons 20 MG TABS tablet TAKE 1 TABLET(20 MG) BY MOUTH DAILY 07/24/21   Skeet Latch, MD  ?   ? ?Allergies    ?Lisinopril, Tree extract, Augmentin [amoxicillin-pot clavulanate], and Ciprofloxacin   ? ?Review of Systems   ?Review of Systems  ?Respiratory:  Positive for shortness of breath.   ? ?Physical Exam ?Updated Vital Signs ?BP (!) 163/98   Pulse 88   Temp 97.7 ?F (36.5 ?C) (Oral)   Resp 15   SpO2 95%  ?Physical Exam ?Vitals and nursing note reviewed.  ?Constitutional:   ?   General: She is not in acute distress. ?   Appearance: She is obese.  ?   Comments: Chronically ill appearing 66 year old female  ?HENT:  ?   Head: Normocephalic and atraumatic.  ?   Nose: Nose normal.  ?   Mouth/Throat:  ?    Mouth: Mucous membranes are moist.  ?Eyes:  ?   General: No scleral icterus. ?Cardiovascular:  ?   Rate and Rhythm: Regular rhythm. Tachycardia present.  ?   Pulses: Normal pulses.  ?   Heart sounds: Normal heart sounds.  ?Pulmonary:  ?   Effort: Pulmonary effort is normal. No respiratory distress.  ?   Breath sounds: No wheezing.  ?   Comments: BL basilar crackles. Mild tachypnea, hypoxic on RA but O2 improved to >92% on 2L [place] which is baseline O2 ?Abdominal:  ?   Palpations: Abdomen is soft.  ?   Tenderness: There is no  abdominal tenderness. There is no guarding or rebound.  ?Musculoskeletal:  ?   Cervical back: Normal range of motion.  ?   Right lower leg: Edema present.  ?   Left lower leg: No edema.  ?Skin: ?   General: Skin is warm and dry.  ?   Capillary Refill: Capillary refill takes less than 2 seconds.  ?   Comments: Large approximately 8 cm bulla that is fluid-filled to the dorsum of the right foot. ?This ruptured during my examination during removal of bandages that patient applied  ?Neurological:  ?   Mental Status: She is alert. Mental status is at baseline.  ?Psychiatric:     ?   Mood and Affect: Mood normal.     ?   Behavior: Behavior normal.  ? ? ?ED Results / Procedures / Treatments   ?Labs ?(all labs ordered are listed, but only abnormal results are displayed) ?Labs Reviewed  ?CBC WITH DIFFERENTIAL/PLATELET - Abnormal; Notable for the following components:  ?    Result Value  ? WBC 10.7 (*)   ? Platelets 137 (*)   ? Neutro Abs 9.6 (*)   ? All other components within normal limits  ?BASIC METABOLIC PANEL - Abnormal; Notable for the following components:  ? Glucose, Bld 173 (*)   ? BUN 34 (*)   ? Creatinine, Ser 1.04 (*)   ? GFR, Estimated 59 (*)   ? All other components within normal limits  ?HEPATIC FUNCTION PANEL - Abnormal; Notable for the following components:  ? Total Protein 6.4 (*)   ? Albumin 3.3 (*)   ? All other components within normal limits  ?BRAIN NATRIURETIC PEPTIDE - Abnormal;  Notable for the following components:  ? B Natriuretic Peptide 243.0 (*)   ? All other components within normal limits  ?D-DIMER, QUANTITATIVE (NOT AT [facility]) - Abnormal; Notable for the following components:  ? D-Dimer

## 2021-12-02 NOTE — ED Notes (Signed)
Patient was made aware of being moved to a hall bed due to medical clearence. Patient refused stating " I am not waiting for my ride in the hall way" Patient was educated that since medical treatment had been done and patient was cleared, patient could be moved to hall bed to wait for ride. Patient was also educated on the importance of waiting in the hall bed due to patient wearing oxygen. Patient refused. Music therapist notified. Patient continued to refuse oxygen while waiting in the lobby. Patient states she has done this before. ?

## 2021-12-02 NOTE — ED Notes (Signed)
Pt informed that she would have to be moved to the hallway to wait on PTAR. Pt states she will not wait in the hallway. This RN and [name], Surveyor, quantity informed pt that she is medically stable and can wait in the hall while we have other emergent pts that need to be seen. Pt states she would have to get dressed and would find her own ride. Pt informed that family would need to bring oxygen to get pt home. Pt states she doesn't need oxygen and that she goes without it all the time. This RN and AD continued to encourage pt to wait for PTAR and use O2. Pt refuses.  ?

## 2021-12-02 NOTE — Progress Notes (Signed)
Error - patient no show.  ? ?Loel Dubonnet, NP  ?

## 2021-12-02 NOTE — Progress Notes (Signed)
Oncology Nurse Navigator Documentation ? ? ?  12/02/2021  ?  3:00 PM 11/27/2021  ? 11:00 AM  ?Oncology Nurse Navigator Flowsheets  ?Navigator Follow Up Date: 12/15/2021 12/03/2021  ?Navigator Follow Up Reason: Awaiting Designer, multimedia  ?Navigator Location [facility]  ?Navigator Encounter Type Molecular Studies Molecular Studies  ?Patient Visit Type Other Other  ?Barriers/Navigation Needs Coordination of Care/I followed up on FO molecular test results via portal.  Shannon [name]'s PDL 1 is completed at 0% and moleculars are pending with an estimated date of completion on 4/21.  Will follow up on molecular test results.  Coordination of Care  ?Interventions Coordination of Care Coordination of Care  ?Acuity Level 2-Minimal Needs (1-2 Barriers Identified) Level 2-Minimal Needs (1-2 Barriers Identified)  ?Coordination of Care Pathology Pathology  ?Time Spent with Patient 30 30  ?  ?

## 2021-12-03 ENCOUNTER — Ambulatory Visit (INDEPENDENT_AMBULATORY_CARE_PROVIDER_SITE_OTHER): Payer: Medicare Other | Admitting: Family

## 2021-12-03 ENCOUNTER — Encounter (HOSPITAL_BASED_OUTPATIENT_CLINIC_OR_DEPARTMENT_OTHER): Payer: Self-pay

## 2021-12-03 DIAGNOSIS — I5022 Chronic systolic (congestive) heart failure: Secondary | ICD-10-CM

## 2021-12-03 LAB — T3, FREE: T3, Free: 2 pg/mL (ref 2.0–4.4)

## 2021-12-03 NOTE — Progress Notes (Incomplete)
?  Radiation Oncology         (336) [phone] ?________________________________ ? ?Patient Name: Shannon [name] ?MRN: [id number] ?DOB: 1956/07/24 ?Referring Physician: Gilles Chiquito (Profile Not Attached) ?Date of Service: 10/28/2021 ?[facility], [place] ? ?                                                      End Of Treatment Note ? ?Diagnoses: C79.51-Secondary malignant neoplasm of bone ? ?Cancer Staging: Stage IA (T1c, N0, M0) non-small cell lung cancer of the left upper lobe, adenocarcinoma ?  ?New evidence of bony metastases; ribs, thoracic spine, sacral spine ? ?Intent: Palliative ? ?Radiation Treatment Dates: 10/08/2021 through 10/28/2021 ?Site Technique Total Dose (Gy) Dose per Fx (Gy) Completed Fx Beam Energies  ?Lumbar Spine: Spine 3D 35/35 2.5 14/14 10X  ? ?Narrative: The patient tolerated radiation therapy relatively well. During her final weekly treatment check on 10/27/21, the patient reported mid back pain, lower back pain, throat pain, fatigue, numbness if her feet and ankles, pedal edema, photosensitivity, a "fluid sac" on the back of her head, and sore throat. ? ?Overall her pain in the lower lumbar spine and upper sacrum area has improved significantly.  She does have some nausea and I have encouraged her to use her Zofran for this issue.  She is inquiring as to whether her sore throat and photosensitivity is related to radiation treatments.  I discussed with her that her radiation would not be causing the symptoms since we are treating a different area of the body.  She will undergo a CT-guided biopsy of one of her bone lesions later this week to aid Dr. Julien Nordmann in planning additional treatment after her radiation therapy ? ?Plan: The patient will follow-up with radiation oncology in one month . ? ?________________________________________________ ?----------------------------------- ? ?Blair Promise, PhD, MD ? ?This document serves as a record of services personally performed by Gery Pray, MD. It was created on his behalf by Roney Mans, a trained medical scribe. The creation of this record is based on the scribe's personal observations and the provider's statements to them. This document has been checked and approved by the attending provider. ? ?

## 2021-12-03 NOTE — Progress Notes (Incomplete)
?Radiation Oncology         (336) [phone] ?________________________________ ? ?Name: Shannon [name] MRN: [id number]  ?Date: 12/04/2021  DOB: 11-20-1955 ? ?Follow-Up Visit Note ? ?CC: [name], MD  [name], MD ? ?No diagnosis found. ? ?Diagnosis:  Stage IA (T1c, N0, M0) non-small cell lung cancer of the left upper lobe, adenocarcinoma ?  ?New evidence of bony metastases; ribs, thoracic spine, sacral spine ? ?Interval Since Last Radiation:  1 month and 6 days ? ?Intent: Palliative ?Radiation Treatment Dates: 10/08/2021 through 10/28/2021 ?Site Technique Total Dose (Gy) Dose per Fx (Gy) Completed Fx Beam Energies  ?Lumbar Spine: Spine 3D 35/35 2.5 14/14 10X  ? ?Radiation Treatment Dates: 04/09/2020 through 04/22/2020 ?Site Technique Total Dose (Gy) Dose per Fx (Gy) Completed Fx Beam Energies  ?Lung, Left: Lung_Lt SBRT 60/60 12 5/5 6XFFF  ? ?Narrative:  The patient returns today for routine follow-up.  The patient tolerated radiation therapy relatively well. During her final weekly treatment check on 10/27/21, the patient reported mid back pain, lower back pain, throat pain, fatigue, numbness if her feet and ankles, pedal edema, photosensitivity, a "fluid sac" on the back of her head, and sore throat. Overall her pain in the lower lumbar spine and upper sacrum area improved significantly near the end of RT.  She did have some nausea and I encouraged her to use her Zofran for this issue.  She also inquired as to whether her sore throat and photosensitivity was related to radiation treatments and I discussed with her that her radiation would not cause these symptoms, since we treated a different area of the body.     ? ?PET scan performed on 10/16/21 showed several hypermetabolic lytic osseous metastasis to the bilateral ribs, thoracic spine and sacrum all new since 01/25/2020 PET scan.  The treated index pulmonary nodule in the left upper lobe was also seen, and appeared to have significantly decreased in size  and metabolism.  Numerous additional small solid bilateral pulmonary nodules scattered throughout both lungs were also appreciated, all new since baseline PET scan on 01/25/2020. The largest of which measuring 0.5 cm in the right upper lobe.      ? ?While undergoing RT, I referred the patient to Dr. Julien Nordmann on 10/23/21 for further management regarding the above PET findings. During this visit, the patient continued to complain of the pain in the lower back as well as the left and right side of the chest. Following review and discussion, Dr. Julien Nordmann recommended for the patient to have CT-guided core biopsy of one of the lytic lesions in the sacral area, or any of the other accessible lytic bone lesions, for confirmation of her disease recurrence.    ? ?MRI of the brain on 11/03/21 showed expansile bony metastasis at the right skull base/clivus. No evidence of intracranial metastatic disease was appreciated. (The patient declined IV contrast).    ? ?After completing RT, the patient again followed up with Dr. Julien Nordmann on 11/06/21. The patient was supposed to have a bone lesion biopsy prior to this visit but canceled this because she was not feeling well. During this visit, the patient denied having any current chest pain but reported an increase in her baseline shortness of breath with exertion with mild cough and no hemoptysis.  She also endorsed occasional nausea but denied taking her antiemetics. ? ?The patient was able to reschedule her biopsy for 11/20/21. Biopsy of the right posterior rib lesion collected revealed metastatic well differentiated adenocarcinoma  consistent with lung cancer primary.        ? ?Accordingly, the patient returned to Dr. Julien Nordmann on 11/27/21 to discuss further treatment options. During this visit, the patient mentioned that her immune system is low because of the HIV and her CD4 count had been low recently.  That being said, she stated that she does not want to start anything anytime soon  until her count is better. (Dr. Julien Nordmann did give her the options of palliative care and hospice, versus consideration of palliative systemic therapy either with targeted therapy if the molecular studies showed an actionable mutation, or a combination of systemic chemotherapy with immunotherapy if she has no actionable mutations). ? ?Recently, the patient presented to the ED on 12/02/21 with shortness of breath. To review, the patient is chronically short of breath and wears oxygen 2 L at baseline. The patient also reported onset of some right lower extremity swelling x 2 weeks, and stated that she often forgets to take her medications including her Lasix, Xarelto, and diltiazem.  She otherwise denied any chest pain. Following evaluation, he symptoms were noted as likely related to noncompliance with her medications. However, labs were notable for abnormally low TSH, elevated D-dimer, and she was found to be tachycardic which improved with her diltiazem. CTA of the chest showed no evidence of PE, and re-demonstrated her locally recurrent left upper lobe lung cancer with widespread metastatic disease to the bones. CT also showed dilatation of the pulmonic trunk (4.2 cm in diameter), concerning for pulmonary arterial hypertension. RLE DVT study performed was also negative. She was discharged home with instructions for OP cardiology follow up. She met with her cardiologist yesterday though encounter notes are pending at this time.  ? ? ?Allergies:  is allergic to lisinopril, tree extract, augmentin [amoxicillin-pot clavulanate], and ciprofloxacin. ? ?Meds: ?Current Outpatient Medications  ?Medication Sig Dispense Refill  ? A&D OINT Apply 1 application. topically daily as needed (rash).    ? albuterol (PROVENTIL) (2.5 MG/3ML) 0.083% nebulizer solution USE 1 VIAL VIA NEBULIZER EVERY 4 HOURS AS NEEDED FOR WHEEZING OR SHORTNESS OF BREATH 75 mL 2  ? Ascorbic Acid (VITAMIN C) 1000 MG tablet Take 1,000 mg by mouth every other  day.     ? b complex vitamins tablet Take 1 tablet by mouth daily.    ? BIKTARVY 50-200-25 MG TABS tablet TAKE 1 TABLET BY MOUTH DAILY 30 tablet 5  ? BIOTIN PO Take 1 tablet by mouth every other day.    ? Calcium Citrate-Vitamin D (CALCIUM + D PO) Take 1 tablet by mouth every other day.    ? CALCIUM PO Take 1 tablet by mouth daily.    ? cyanocobalamin (,VITAMIN B-12,) 1000 MCG/ML injection ADMINISTER 1 ML(1000 MCG) IN THE MUSCLE EVERY 30 DAYS 3 mL 1  ? dexamethasone (DECADRON) 4 MG tablet Take 1 tablet (4 mg total) by mouth 2 (two) times daily with a meal. 30 tablet 0  ? diltiazem (CARDIZEM CD) 240 MG 24 hr capsule TAKE 1 CAPSULE(240 MG) BY MOUTH DAILY 90 capsule 3  ? famotidine (PEPCID) 40 MG tablet Take 1 tablet (40 mg total) by mouth daily. 30 tablet 11  ? furosemide (LASIX) 20 MG tablet TAKE 2 TABLETS BY MOUTH TWICE A DAY FOR 5 DAYS THEN AS DIRECTED BY PHYSICIAN 60 tablet 0  ? magnesium hydroxide (MILK OF MAGNESIA) 400 MG/5ML suspension Take 15 mLs by mouth daily as needed for mild constipation.    ? Melatonin 5 MG TABS Take  5 mg by mouth at bedtime as needed (for sleep).    ? Misc. Devices (PULSE OXIMETER FOR FINGER) MISC 1 Units by Does not apply route as needed. 1 each 0  ? morphine (MS CONTIN) 15 MG 12 hr tablet Take 1 tablet by mouth every 12 hours. (Patient not taking: Reported on 11/27/2021) 30 tablet 0  ? Mouthwashes (BIOTENE DRY MOUTH GENTLE) LIQD Use as directed 1 Dose in the mouth or throat 2 (two) times daily as needed (dry mouth).    ? Naphazoline HCl (CLEAR EYES OP) Place 1 drop into both eyes daily as needed (redness).    ? neomycin-bacitracin-polymyxin (NEOSPORIN) ointment Apply 1 application. topically as needed for wound care.    ? Olopatadine HCl (PATADAY OP) Place 1 drop into both eyes daily as needed (burning).    ? ondansetron (ZOFRAN) 4 MG tablet Take 1 tablet (4 mg total) by mouth every 8 (eight) hours as needed for nausea or vomiting. (Patient not taking: Reported on 11/27/2021) 20 tablet 0   ? Oxycodone HCl 10 MG TABS Take 10 mg by mouth every 6 (six) hours as needed (breakthrough pain).    ? OXYGEN Inhale 1.5-2 L into the lungs See admin instructions. Uses at bedtime and through the day as

## 2021-12-04 ENCOUNTER — Telehealth: Payer: Self-pay | Admitting: *Deleted

## 2021-12-04 ENCOUNTER — Encounter (HOSPITAL_COMMUNITY): Payer: Self-pay | Admitting: Internal Medicine

## 2021-12-04 ENCOUNTER — Ambulatory Visit
Admission: RE | Admit: 2021-12-04 | Discharge: 2021-12-04 | Disposition: A | Discharge status: Home / Self Care | Payer: Medicare Other | Source: Ambulatory Visit | Attending: Radiation Oncology | Admitting: Radiation Oncology

## 2021-12-04 ENCOUNTER — Other Ambulatory Visit (HOSPITAL_COMMUNITY): Payer: Self-pay

## 2021-12-04 ENCOUNTER — Ambulatory Visit: Payer: Self-pay

## 2021-12-04 ENCOUNTER — Encounter: Payer: Self-pay | Admitting: *Deleted

## 2021-12-04 NOTE — Progress Notes (Signed)
Oncology Nurse Navigator Documentation ? ? ?  12/04/2021  ? 10:00 AM 12/02/2021  ?  3:00 PM 11/27/2021  ? 11:00 AM  ?Oncology Nurse Navigator Flowsheets  ?Navigator Follow Up Date: 12/23/2021 12/15/2021 12/03/2021  ?Navigator Follow Up Reason: Follow-up Appointment Awaiting Molecular Testing Molecular Testing  ?Navigator Location [facility]  ?Navigator Encounter Type Tax adviser Studies Molecular Studies  ?Telephone Outgoing Call    ?Patient Visit Type Other Other Other  ?Barriers/Navigation Needs Coordination of Care/I received a message from Dr. Julien Nordmann that patient needs Guardant blood test.  I called her to arrange this appt but was unable to reach. I did leave a vm message with my name and phone number to call. I also received a message from a care coordinator stating patient needs some kind of assistance at home.  I will ask what she needs at home when she calls me back.  Coordination of Care Coordination of Care  ?Interventions Coordination of Care Coordination of Care Coordination of Care  ?Acuity Level 2-Minimal Needs (1-2 Barriers Identified) Level 2-Minimal Needs (1-2 Barriers Identified) Level 2-Minimal Needs (1-2 Barriers Identified)  ?Coordination of Care Pathology Pathology Pathology  ?Time Spent with Patient 30 30 30   ?  ?
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
## 2021-12-04 NOTE — Telephone Encounter (Signed)
Called patient to ask about rescheduling today's fu, lvm for a return call ?

## 2021-12-05 ENCOUNTER — Telehealth: Payer: Self-pay | Admitting: *Deleted

## 2021-12-05 ENCOUNTER — Encounter: Payer: Medicare Other | Admitting: Internal Medicine

## 2021-12-05 NOTE — Telephone Encounter (Signed)
CALLED PATIENT TO ALTER FU ON 12/18/21 DUE TO DR. KINARD BEING ON VACATION, RESCHEDULED FOR 01/05/22 @ 11:30 AM, LVM FOR A RETURN CALL ?

## 2021-12-07 LAB — CULTURE, BLOOD (SINGLE): Culture: NO GROWTH

## 2021-12-08 ENCOUNTER — Ambulatory Visit (HOSPITAL_BASED_OUTPATIENT_CLINIC_OR_DEPARTMENT_OTHER): Payer: Medicaid Other | Admitting: Cardiovascular Disease

## 2021-12-08 ENCOUNTER — Encounter: Payer: Self-pay | Admitting: *Deleted

## 2021-12-08 ENCOUNTER — Ambulatory Visit: Payer: Medicare Other | Admitting: Internal Medicine

## 2021-12-08 DIAGNOSIS — C3492 Malignant neoplasm of unspecified part of left bronchus or lung: Secondary | ICD-10-CM

## 2021-12-08 NOTE — Progress Notes (Signed)
Oncology Nurse Navigator Documentation ? ? ?  12/08/2021  ? 10:00 AM 12/04/2021  ? 10:00 AM 12/02/2021  ?  3:00 PM 11/27/2021  ? 11:00 AM  ?Oncology Nurse Navigator Flowsheets  ?Navigator Follow Up Date:  12/23/2021 12/15/2021 12/03/2021  ?Navigator Follow Up Reason:  Follow-up Appointment Awaiting Molecular Testing Molecular Testing  ?Navigator Location [facility]  ?Navigator Encounter Type Telephone Telephone Molecular Studies Molecular Studies  ?Telephone Outgoing Call [place] Call    ?Patient Visit Type Other Other Other Other  ?Barriers/Navigation Needs Coordination of Care;Education Coordination of Care Coordination of Care Coordination of Care  ?Education Other     ?Interventions Coordination of Care;Education/per Dr. Julien Nordmann, I called Shannon [name] to arrange an appt for her to get lab work on 4/19.  I called patient but was unable to reach. I did leave a vm message with an update.  Guardant 360 order completed.  Coordination of Care Coordination of Care Coordination of Care  ?Acuity Level 2-Minimal Needs (1-2 Barriers Identified) Level 2-Minimal Needs (1-2 Barriers Identified) Level 2-Minimal Needs (1-2 Barriers Identified) Level 2-Minimal Needs (1-2 Barriers Identified)  ?Coordination of Care Appts Pathology Pathology Pathology  ?Education Method Verbal     ?Time Spent with Patient 30 30 30 30   ?  ?

## 2021-12-09 ENCOUNTER — Telehealth: Payer: Self-pay | Admitting: *Deleted

## 2021-12-09 ENCOUNTER — Encounter: Payer: Medicare Other | Admitting: Internal Medicine

## 2021-12-09 NOTE — Telephone Encounter (Signed)
CALLED PATIENT TO INFORM THAT FU APPT. WITH DR. KINARD HAS BEEN MOVED TO 01-05-22 @ 11:30 AM, DUE TO DR. KINARD BEING ON VACATION, LVM FOR A RETURN CALL ?

## 2021-12-10 ENCOUNTER — Inpatient Hospital Stay: Payer: Medicare Other

## 2021-12-10 ENCOUNTER — Telehealth: Payer: Self-pay

## 2021-12-10 ENCOUNTER — Inpatient Hospital Stay: Payer: Medicare Other | Admitting: Nurse Practitioner

## 2021-12-10 NOTE — Telephone Encounter (Signed)
Pt was a no show for today's apt with palliative NP Left Vm requesting call back to reschedule. ?

## 2021-12-11 ENCOUNTER — Telehealth: Payer: Self-pay | Admitting: Medical Oncology

## 2021-12-11 ENCOUNTER — Telehealth: Payer: Self-pay

## 2021-12-11 ENCOUNTER — Ambulatory Visit: Payer: Medicare Other

## 2021-12-11 NOTE — Telephone Encounter (Signed)
Pt returned call from [facility] regarding her appt.  She is not able to walk or put weight on her legs due to the edema and she has a blister on her buttocks.  She is asking if we can set up [place] for lab work and evaluation. ?
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
## 2021-12-11 NOTE — Telephone Encounter (Signed)
?  Care Management  ? ?Outreach Note ? ?12/11/2021 ?Name: Shannon [name] MRN: [id number] DOB: 1956/06/07 ? ?Referred by: [name], MD ?Reason for referral : No chief complaint on file. ? ? ?An unsuccessful telephone outreach was attempted today. The patient was referred to the case management team for assistance with care management and care coordination.  ? ?Follow Up Plan: The care management team will reach out to the patient again over the next 7 days.  Left return call information on patient identified voicemail. ? ?[name], RN, BSN, CCM ?Care Management Coordinator ?[facility] ?Phone: [phone]/XEN: [phone]  ?

## 2021-12-11 NOTE — Patient Instructions (Signed)
Visit Information ? ?Thank you for taking time to visit with me today. Please don't hesitate to contact me if I can be of assistance to you before our next scheduled telephone appointment. ? ?Our next appointment is by telephone on 12/22/21 at 0900 ? ?Please call the care guide team at [phone] if you need to cancel or reschedule your appointment.  ? ?If you are experiencing a Mental Health or [place] or need someone to talk to, please call the [place] National Suicide Prevention Lifeline: (817) 833-7260 or TTY: 620-233-3077 TTY (909)879-0333) to talk to a trained counselor  ? ?Patient verbalizes understanding of instructions and care plan provided today and agrees to view in [place]. Active MyChart status confirmed with patient.   ? ?The patient has been provided with contact information for the care management team and has been advised to call with any health related questions or concerns.  ? ?[name], RN, BSN, CCM ?Care Management Coordinator ?[facility] ?Phone: [phone]/NRW: [phone]  ?

## 2021-12-11 NOTE — Telephone Encounter (Signed)
?  Lower extremity swelling- ?She reports she cannot get up on  her legs because they are " swollen like logs".  ?She is asking for labs to be drawn at home.  ? ?I LVM that Ansyi investigated getting her labs drawn at her house and  it can't be done.  ? ?I told her to please call back and r/s palliative care appt that was missed yesterday . I told her she is scheduled with Julien Nordmann on may 5th. ?

## 2021-12-12 ENCOUNTER — Telehealth: Payer: Self-pay | Admitting: Medical Oncology

## 2021-12-12 NOTE — Telephone Encounter (Signed)
Lower extremity Swelling- ?Pt sister, Shannon [name] , concerned because [place] told her today that she has  ?" elephantitis and a burn on her foot". ? ?Shannon [name] is going to have a friend go see pt today re lower extremity skin and swelling because she  said pt is not reliable and "sometimes Tarry will take on our mothers symptoms". She will f/u with  pts PCP re concerns. ? ?Lab for Molecular testing r/s to 05/02-followed by office visit . ( Pt cannot get this test drawn at home).  ?  ?Shannon [name] confirmed appt and will set up transportation to bring pt to cancer center for her appts.  ? ? ?

## 2021-12-15 ENCOUNTER — Telehealth: Payer: Self-pay | Admitting: Medical Oncology

## 2021-12-15 NOTE — Telephone Encounter (Signed)
Medication Management  ?Refills requested for: ?-oxycodone. ? There is no record of oxycodone prescriber.  ?-Albuterol  for nebulizer machine.  ?-Dexamethasone ? ?Per  [facility] the following meds were prescribed: ? ?09/05/21-Hydrocodone 5-325 mg . Take 1 tablet TID prn # 24- Dr D. Brooks ? ?09/30/21- Oxycodone IR 10 mg tablets. Take 1 tablet q 6 hours prn  # 60 by Dr Davy Pique. ? ?11/12/21-dexamethasone was picked up in March and I told pt that there were no refills and to call Dr Venia Minks for refill.  ? ? ? She was told  to f/u with Dr Venia Minks re "burn" as recommended by ED.  ?She said she will be here for lab appt next week.  ? ?

## 2021-12-15 NOTE — Telephone Encounter (Signed)
Sister notified: ?Lab appt changed to this wed and other lab appts for next week are cancelled. ? ?Shannon [name] will arrange transportation for  this wed lab appt and for next week appts. ?Sister aware of lab appt this week and that Shannon [name] is arranging transportation. ?

## 2021-12-16 ENCOUNTER — Telehealth: Payer: Self-pay | Admitting: Medical Oncology

## 2021-12-16 ENCOUNTER — Other Ambulatory Visit: Payer: Self-pay | Admitting: *Deleted

## 2021-12-16 MED ORDER — DEXAMETHASONE 4 MG PO TABS: 4.0000 mg | ORAL_TABLET | Freq: Two times a day (BID) | ORAL | 0 refills | Status: AC

## 2021-12-16 MED ORDER — SPIRONOLACTONE 25 MG PO TABS: 25.0000 mg | ORAL_TABLET | Freq: Every day | ORAL | 0 refills | Status: DC

## 2021-12-16 MED ORDER — AMOXICILLIN 500 MG PO TABS: 500.0000 mg | ORAL_TABLET | Freq: Two times a day (BID) | ORAL | 0 refills | Status: DC

## 2021-12-16 NOTE — Telephone Encounter (Signed)
Patient called in requesting refills on the following meds: ? ?Amoxicillin ?Decadron ?Spironolactone ?Oxycodone ( States morphine is too strong for her) ? ?Also reports her legs are very swollen and weeping. Would like to schedule appt at [facility] to have this evaluated. ? ?

## 2021-12-16 NOTE — Telephone Encounter (Signed)
I do not fill her oxycodone.  Would recommend her calling oncology or palliative care for that one.  Others are filled for her.   Please have her schedule with a resident.  ? ?Gilles Chiquito, MD ? ?

## 2021-12-16 NOTE — Telephone Encounter (Signed)
LVM on sisters phone to return my call because today ,  Cheyna cancelled her lab appt for tomorrow. ?

## 2021-12-16 NOTE — Telephone Encounter (Signed)
LVM for sister to return my call because pt cancelled her appt for tomorrow.  ?

## 2021-12-16 NOTE — Telephone Encounter (Signed)
Patient made aware of refills and will request refill on oxycodone from Oncologist. ? ?She has made an appt with Red Team Provider for 5/2 at 1515. ?

## 2021-12-17 ENCOUNTER — Encounter: Payer: Self-pay | Admitting: Nurse Practitioner

## 2021-12-17 ENCOUNTER — Inpatient Hospital Stay (HOSPITAL_COMMUNITY): Payer: Medicare Other | Admitting: Registered Nurse

## 2021-12-17 ENCOUNTER — Encounter (HOSPITAL_COMMUNITY)
Admission: EM | Disposition: A | Discharge status: Skilled Nursing Facility | Payer: Self-pay | Source: Home / Self Care | Attending: Internal Medicine

## 2021-12-17 ENCOUNTER — Inpatient Hospital Stay: Payer: Medicare Other

## 2021-12-17 ENCOUNTER — Encounter (HOSPITAL_COMMUNITY): Payer: Self-pay | Admitting: Oncology

## 2021-12-17 ENCOUNTER — Inpatient Hospital Stay (HOSPITAL_COMMUNITY): Payer: Medicare Other

## 2021-12-17 ENCOUNTER — Inpatient Hospital Stay (HOSPITAL_BASED_OUTPATIENT_CLINIC_OR_DEPARTMENT_OTHER): Payer: Medicare Other | Admitting: Nurse Practitioner

## 2021-12-17 ENCOUNTER — Other Ambulatory Visit: Payer: Self-pay

## 2021-12-17 ENCOUNTER — Inpatient Hospital Stay (HOSPITAL_COMMUNITY)
Admission: EM | Admit: 2021-12-17 | Discharge: 2022-01-14 | DRG: 463 | Disposition: A | Discharge status: Skilled Nursing Facility | Payer: Medicare Other | Attending: Internal Medicine | Admitting: Internal Medicine

## 2021-12-17 ENCOUNTER — Encounter: Payer: Self-pay | Admitting: Internal Medicine

## 2021-12-17 ENCOUNTER — Emergency Department (HOSPITAL_COMMUNITY): Payer: Medicare Other

## 2021-12-17 VITALS — BP 160/85 | HR 111 | Temp 98.3°F | Resp 18 | Ht 63.5 in

## 2021-12-17 DIAGNOSIS — G939 Disorder of brain, unspecified: Secondary | ICD-10-CM | POA: Diagnosis not present

## 2021-12-17 DIAGNOSIS — N183 Chronic kidney disease, stage 3 unspecified: Secondary | ICD-10-CM | POA: Diagnosis not present

## 2021-12-17 DIAGNOSIS — B20 Human immunodeficiency virus [HIV] disease: Secondary | ICD-10-CM | POA: Diagnosis present

## 2021-12-17 DIAGNOSIS — Z91048 Other nonmedicinal substance allergy status: Secondary | ICD-10-CM

## 2021-12-17 DIAGNOSIS — N1831 Chronic kidney disease, stage 3a: Secondary | ICD-10-CM | POA: Diagnosis not present

## 2021-12-17 DIAGNOSIS — M79631 Pain in right forearm: Secondary | ICD-10-CM | POA: Diagnosis not present

## 2021-12-17 DIAGNOSIS — L02619 Cutaneous abscess of unspecified foot: Secondary | ICD-10-CM | POA: Diagnosis not present

## 2021-12-17 DIAGNOSIS — R0602 Shortness of breath: Secondary | ICD-10-CM | POA: Diagnosis not present

## 2021-12-17 DIAGNOSIS — Z7901 Long term (current) use of anticoagulants: Secondary | ICD-10-CM

## 2021-12-17 DIAGNOSIS — K219 Gastro-esophageal reflux disease without esophagitis: Secondary | ICD-10-CM | POA: Diagnosis present

## 2021-12-17 DIAGNOSIS — I5089 Other heart failure: Secondary | ICD-10-CM | POA: Diagnosis not present

## 2021-12-17 DIAGNOSIS — C3492 Malignant neoplasm of unspecified part of left bronchus or lung: Secondary | ICD-10-CM

## 2021-12-17 DIAGNOSIS — G4733 Obstructive sleep apnea (adult) (pediatric): Secondary | ICD-10-CM | POA: Diagnosis present

## 2021-12-17 DIAGNOSIS — E1122 Type 2 diabetes mellitus with diabetic chronic kidney disease: Secondary | ICD-10-CM | POA: Diagnosis present

## 2021-12-17 DIAGNOSIS — I471 Supraventricular tachycardia: Secondary | ICD-10-CM | POA: Diagnosis not present

## 2021-12-17 DIAGNOSIS — C3490 Malignant neoplasm of unspecified part of unspecified bronchus or lung: Secondary | ICD-10-CM | POA: Diagnosis not present

## 2021-12-17 DIAGNOSIS — E8729 Other acidosis: Secondary | ICD-10-CM | POA: Diagnosis not present

## 2021-12-17 DIAGNOSIS — Z8249 Family history of ischemic heart disease and other diseases of the circulatory system: Secondary | ICD-10-CM

## 2021-12-17 DIAGNOSIS — Z6841 Body Mass Index (BMI) 40.0 and over, adult: Secondary | ICD-10-CM

## 2021-12-17 DIAGNOSIS — M810 Age-related osteoporosis without current pathological fracture: Secondary | ICD-10-CM | POA: Diagnosis not present

## 2021-12-17 DIAGNOSIS — L02511 Cutaneous abscess of right hand: Secondary | ICD-10-CM | POA: Diagnosis not present

## 2021-12-17 DIAGNOSIS — I130 Hypertensive heart and chronic kidney disease with heart failure and stage 1 through stage 4 chronic kidney disease, or unspecified chronic kidney disease: Secondary | ICD-10-CM | POA: Diagnosis not present

## 2021-12-17 DIAGNOSIS — J9811 Atelectasis: Secondary | ICD-10-CM | POA: Diagnosis not present

## 2021-12-17 DIAGNOSIS — I96 Gangrene, not elsewhere classified: Secondary | ICD-10-CM | POA: Diagnosis not present

## 2021-12-17 DIAGNOSIS — M726 Necrotizing fasciitis: Secondary | ICD-10-CM | POA: Diagnosis not present

## 2021-12-17 DIAGNOSIS — J45909 Unspecified asthma, uncomplicated: Secondary | ICD-10-CM | POA: Diagnosis not present

## 2021-12-17 DIAGNOSIS — N179 Acute kidney failure, unspecified: Secondary | ICD-10-CM | POA: Diagnosis present

## 2021-12-17 DIAGNOSIS — L02415 Cutaneous abscess of right lower limb: Secondary | ICD-10-CM | POA: Diagnosis not present

## 2021-12-17 DIAGNOSIS — A4159 Other Gram-negative sepsis: Secondary | ICD-10-CM | POA: Diagnosis not present

## 2021-12-17 DIAGNOSIS — I639 Cerebral infarction, unspecified: Secondary | ICD-10-CM | POA: Diagnosis not present

## 2021-12-17 DIAGNOSIS — Z880 Allergy status to penicillin: Secondary | ICD-10-CM

## 2021-12-17 DIAGNOSIS — R600 Localized edema: Secondary | ICD-10-CM

## 2021-12-17 DIAGNOSIS — A480 Gas gangrene: Secondary | ICD-10-CM

## 2021-12-17 DIAGNOSIS — Z48817 Encounter for surgical aftercare following surgery on the skin and subcutaneous tissue: Secondary | ICD-10-CM | POA: Diagnosis not present

## 2021-12-17 DIAGNOSIS — E875 Hyperkalemia: Secondary | ICD-10-CM | POA: Diagnosis present

## 2021-12-17 DIAGNOSIS — L8990 Pressure ulcer of unspecified site, unspecified stage: Secondary | ICD-10-CM | POA: Insufficient documentation

## 2021-12-17 DIAGNOSIS — I2510 Atherosclerotic heart disease of native coronary artery without angina pectoris: Secondary | ICD-10-CM

## 2021-12-17 DIAGNOSIS — J9611 Chronic respiratory failure with hypoxia: Secondary | ICD-10-CM | POA: Diagnosis present

## 2021-12-17 DIAGNOSIS — Z8349 Family history of other endocrine, nutritional and metabolic diseases: Secondary | ICD-10-CM

## 2021-12-17 DIAGNOSIS — C7951 Secondary malignant neoplasm of bone: Secondary | ICD-10-CM | POA: Diagnosis present

## 2021-12-17 DIAGNOSIS — J9622 Acute and chronic respiratory failure with hypercapnia: Secondary | ICD-10-CM | POA: Diagnosis not present

## 2021-12-17 DIAGNOSIS — E785 Hyperlipidemia, unspecified: Secondary | ICD-10-CM | POA: Diagnosis not present

## 2021-12-17 DIAGNOSIS — F419 Anxiety disorder, unspecified: Secondary | ICD-10-CM | POA: Diagnosis present

## 2021-12-17 DIAGNOSIS — Z1159 Encounter for screening for other viral diseases: Secondary | ICD-10-CM | POA: Diagnosis not present

## 2021-12-17 DIAGNOSIS — E11621 Type 2 diabetes mellitus with foot ulcer: Secondary | ICD-10-CM | POA: Diagnosis present

## 2021-12-17 DIAGNOSIS — Z87891 Personal history of nicotine dependence: Secondary | ICD-10-CM | POA: Diagnosis not present

## 2021-12-17 DIAGNOSIS — I4891 Unspecified atrial fibrillation: Secondary | ICD-10-CM | POA: Diagnosis not present

## 2021-12-17 DIAGNOSIS — L03116 Cellulitis of left lower limb: Secondary | ICD-10-CM | POA: Diagnosis present

## 2021-12-17 DIAGNOSIS — B377 Candidal sepsis: Secondary | ICD-10-CM | POA: Diagnosis not present

## 2021-12-17 DIAGNOSIS — L03113 Cellulitis of right upper limb: Secondary | ICD-10-CM | POA: Diagnosis not present

## 2021-12-17 DIAGNOSIS — I509 Heart failure, unspecified: Secondary | ICD-10-CM | POA: Diagnosis not present

## 2021-12-17 DIAGNOSIS — J441 Chronic obstructive pulmonary disease with (acute) exacerbation: Secondary | ICD-10-CM | POA: Diagnosis not present

## 2021-12-17 DIAGNOSIS — G9341 Metabolic encephalopathy: Secondary | ICD-10-CM | POA: Diagnosis not present

## 2021-12-17 DIAGNOSIS — Z515 Encounter for palliative care: Secondary | ICD-10-CM | POA: Diagnosis not present

## 2021-12-17 DIAGNOSIS — Z825 Family history of asthma and other chronic lower respiratory diseases: Secondary | ICD-10-CM

## 2021-12-17 DIAGNOSIS — I10 Essential (primary) hypertension: Secondary | ICD-10-CM | POA: Diagnosis present

## 2021-12-17 DIAGNOSIS — J449 Chronic obstructive pulmonary disease, unspecified: Secondary | ICD-10-CM | POA: Diagnosis not present

## 2021-12-17 DIAGNOSIS — I4821 Permanent atrial fibrillation: Secondary | ICD-10-CM | POA: Diagnosis not present

## 2021-12-17 DIAGNOSIS — G893 Neoplasm related pain (acute) (chronic): Secondary | ICD-10-CM

## 2021-12-17 DIAGNOSIS — Z833 Family history of diabetes mellitus: Secondary | ICD-10-CM

## 2021-12-17 DIAGNOSIS — J9621 Acute and chronic respiratory failure with hypoxia: Secondary | ICD-10-CM | POA: Diagnosis not present

## 2021-12-17 DIAGNOSIS — F410 Panic disorder [episodic paroxysmal anxiety] without agoraphobia: Secondary | ICD-10-CM | POA: Diagnosis present

## 2021-12-17 DIAGNOSIS — I5032 Chronic diastolic (congestive) heart failure: Secondary | ICD-10-CM | POA: Diagnosis present

## 2021-12-17 DIAGNOSIS — L02611 Cutaneous abscess of right foot: Secondary | ICD-10-CM | POA: Diagnosis present

## 2021-12-17 DIAGNOSIS — I878 Other specified disorders of veins: Secondary | ICD-10-CM | POA: Diagnosis present

## 2021-12-17 DIAGNOSIS — G9389 Other specified disorders of brain: Secondary | ICD-10-CM | POA: Diagnosis not present

## 2021-12-17 DIAGNOSIS — I4892 Unspecified atrial flutter: Secondary | ICD-10-CM | POA: Diagnosis present

## 2021-12-17 DIAGNOSIS — I5030 Unspecified diastolic (congestive) heart failure: Secondary | ICD-10-CM | POA: Diagnosis not present

## 2021-12-17 DIAGNOSIS — I110 Hypertensive heart disease with heart failure: Secondary | ICD-10-CM

## 2021-12-17 DIAGNOSIS — L89311 Pressure ulcer of right buttock, stage 1: Secondary | ICD-10-CM | POA: Diagnosis present

## 2021-12-17 DIAGNOSIS — I5033 Acute on chronic diastolic (congestive) heart failure: Secondary | ICD-10-CM | POA: Diagnosis not present

## 2021-12-17 DIAGNOSIS — Z7401 Bed confinement status: Secondary | ICD-10-CM | POA: Diagnosis not present

## 2021-12-17 DIAGNOSIS — R269 Unspecified abnormalities of gait and mobility: Secondary | ICD-10-CM | POA: Diagnosis not present

## 2021-12-17 DIAGNOSIS — G319 Degenerative disease of nervous system, unspecified: Secondary | ICD-10-CM | POA: Diagnosis not present

## 2021-12-17 DIAGNOSIS — E538 Deficiency of other specified B group vitamins: Secondary | ICD-10-CM | POA: Diagnosis present

## 2021-12-17 DIAGNOSIS — R0902 Hypoxemia: Secondary | ICD-10-CM | POA: Diagnosis not present

## 2021-12-17 DIAGNOSIS — S80821A Blister (nonthermal), right lower leg, initial encounter: Secondary | ICD-10-CM

## 2021-12-17 DIAGNOSIS — M609 Myositis, unspecified: Secondary | ICD-10-CM | POA: Diagnosis not present

## 2021-12-17 DIAGNOSIS — J9602 Acute respiratory failure with hypercapnia: Secondary | ICD-10-CM

## 2021-12-17 DIAGNOSIS — Z86711 Personal history of pulmonary embolism: Secondary | ICD-10-CM

## 2021-12-17 DIAGNOSIS — R911 Solitary pulmonary nodule: Secondary | ICD-10-CM | POA: Diagnosis not present

## 2021-12-17 DIAGNOSIS — Z888 Allergy status to other drugs, medicaments and biological substances status: Secondary | ICD-10-CM

## 2021-12-17 DIAGNOSIS — D62 Acute posthemorrhagic anemia: Secondary | ICD-10-CM | POA: Diagnosis not present

## 2021-12-17 DIAGNOSIS — Z4781 Encounter for orthopedic aftercare following surgical amputation: Secondary | ICD-10-CM | POA: Diagnosis not present

## 2021-12-17 DIAGNOSIS — Z89611 Acquired absence of right leg above knee: Secondary | ICD-10-CM | POA: Diagnosis not present

## 2021-12-17 DIAGNOSIS — Z794 Long term (current) use of insulin: Secondary | ICD-10-CM

## 2021-12-17 DIAGNOSIS — B49 Unspecified mycosis: Secondary | ICD-10-CM

## 2021-12-17 DIAGNOSIS — E1165 Type 2 diabetes mellitus with hyperglycemia: Secondary | ICD-10-CM | POA: Diagnosis present

## 2021-12-17 DIAGNOSIS — D638 Anemia in other chronic diseases classified elsewhere: Secondary | ICD-10-CM | POA: Diagnosis present

## 2021-12-17 DIAGNOSIS — Z79899 Other long term (current) drug therapy: Secondary | ICD-10-CM

## 2021-12-17 DIAGNOSIS — Z91199 Patient's noncompliance with other medical treatment and regimen due to unspecified reason: Secondary | ICD-10-CM

## 2021-12-17 DIAGNOSIS — M6281 Muscle weakness (generalized): Secondary | ICD-10-CM | POA: Diagnosis not present

## 2021-12-17 DIAGNOSIS — E559 Vitamin D deficiency, unspecified: Secondary | ICD-10-CM | POA: Diagnosis not present

## 2021-12-17 DIAGNOSIS — D539 Nutritional anemia, unspecified: Secondary | ICD-10-CM | POA: Diagnosis not present

## 2021-12-17 DIAGNOSIS — B961 Klebsiella pneumoniae [K. pneumoniae] as the cause of diseases classified elsewhere: Secondary | ICD-10-CM | POA: Diagnosis present

## 2021-12-17 DIAGNOSIS — Z4682 Encounter for fitting and adjustment of non-vascular catheter: Secondary | ICD-10-CM | POA: Diagnosis not present

## 2021-12-17 DIAGNOSIS — F05 Delirium due to known physiological condition: Secondary | ICD-10-CM | POA: Diagnosis not present

## 2021-12-17 DIAGNOSIS — E1152 Type 2 diabetes mellitus with diabetic peripheral angiopathy with gangrene: Secondary | ICD-10-CM | POA: Diagnosis present

## 2021-12-17 DIAGNOSIS — E118 Type 2 diabetes mellitus with unspecified complications: Secondary | ICD-10-CM | POA: Diagnosis not present

## 2021-12-17 DIAGNOSIS — M7989 Other specified soft tissue disorders: Secondary | ICD-10-CM | POA: Diagnosis not present

## 2021-12-17 DIAGNOSIS — D696 Thrombocytopenia, unspecified: Secondary | ICD-10-CM | POA: Diagnosis not present

## 2021-12-17 DIAGNOSIS — C3491 Malignant neoplasm of unspecified part of right bronchus or lung: Secondary | ICD-10-CM | POA: Diagnosis not present

## 2021-12-17 DIAGNOSIS — I483 Typical atrial flutter: Secondary | ICD-10-CM | POA: Diagnosis not present

## 2021-12-17 DIAGNOSIS — R410 Disorientation, unspecified: Secondary | ICD-10-CM | POA: Diagnosis not present

## 2021-12-17 DIAGNOSIS — I484 Atypical atrial flutter: Secondary | ICD-10-CM | POA: Diagnosis not present

## 2021-12-17 DIAGNOSIS — R609 Edema, unspecified: Secondary | ICD-10-CM | POA: Diagnosis not present

## 2021-12-17 DIAGNOSIS — E46 Unspecified protein-calorie malnutrition: Secondary | ICD-10-CM | POA: Diagnosis not present

## 2021-12-17 DIAGNOSIS — E872 Acidosis, unspecified: Secondary | ICD-10-CM | POA: Diagnosis present

## 2021-12-17 DIAGNOSIS — L0291 Cutaneous abscess, unspecified: Secondary | ICD-10-CM | POA: Diagnosis not present

## 2021-12-17 HISTORY — PX: IRRIGATION AND DEBRIDEMENT FOOT: SHX6602

## 2021-12-17 LAB — CBC WITH DIFFERENTIAL (CANCER CENTER ONLY)
Abs Immature Granulocytes: 0.08 10*3/uL — ABNORMAL HIGH (ref 0.00–0.07)
Basophils Absolute: 0 10*3/uL (ref 0.0–0.1)
Basophils Relative: 0 %
Eosinophils Absolute: 0 10*3/uL (ref 0.0–0.5)
Eosinophils Relative: 0 %
HCT: 39.1 % (ref 36.0–46.0)
Hemoglobin: 12.5 g/dL (ref 12.0–15.0)
Immature Granulocytes: 1 %
Lymphocytes Relative: 3 %
Lymphs Abs: 0.3 10*3/uL — ABNORMAL LOW (ref 0.7–4.0)
MCH: 31.4 pg (ref 26.0–34.0)
MCHC: 32 g/dL (ref 30.0–36.0)
MCV: 98.2 fL (ref 80.0–100.0)
Monocytes Absolute: 0.8 10*3/uL (ref 0.1–1.0)
Monocytes Relative: 7 %
Neutro Abs: 10.3 10*3/uL — ABNORMAL HIGH (ref 1.7–7.7)
Neutrophils Relative %: 89 %
Platelet Count: 191 10*3/uL (ref 150–400)
RBC: 3.98 MIL/uL (ref 3.87–5.11)
RDW: 13.5 % (ref 11.5–15.5)
WBC Count: 11.5 10*3/uL — ABNORMAL HIGH (ref 4.0–10.5)
nRBC: 0 % (ref 0.0–0.2)

## 2021-12-17 LAB — CMP (CANCER CENTER ONLY)
ALT: 41 U/L (ref 0–44)
AST: 16 U/L (ref 15–41)
Albumin: 3.4 g/dL — ABNORMAL LOW (ref 3.5–5.0)
Alkaline Phosphatase: 84 U/L (ref 38–126)
Anion gap: 6 (ref 5–15)
BUN: 46 mg/dL — ABNORMAL HIGH (ref 8–23)
CO2: 34 mmol/L — ABNORMAL HIGH (ref 22–32)
Calcium: 10.8 mg/dL — ABNORMAL HIGH (ref 8.9–10.3)
Chloride: 99 mmol/L (ref 98–111)
Creatinine: 1.86 mg/dL — ABNORMAL HIGH (ref 0.44–1.00)
GFR, Estimated: 29 mL/min — ABNORMAL LOW (ref >60)
Glucose, Bld: 241 mg/dL — ABNORMAL HIGH (ref 70–99)
Potassium: 5.3 mmol/L — ABNORMAL HIGH (ref 3.5–5.1)
Sodium: 139 mmol/L (ref 135–145)
Total Bilirubin: 0.4 mg/dL (ref 0.3–1.2)
Total Protein: 6.5 g/dL (ref 6.5–8.1)

## 2021-12-17 LAB — BRAIN NATRIURETIC PEPTIDE: B Natriuretic Peptide: 155.2 pg/mL — ABNORMAL HIGH (ref 0.0–100.0)

## 2021-12-17 LAB — LACTIC ACID, PLASMA
Lactic Acid, Venous: 2.2 mmol/L (ref 0.5–1.9)
Lactic Acid, Venous: 1.5 mmol/L (ref 0.5–1.9)

## 2021-12-17 LAB — SEDIMENTATION RATE: Sed Rate: 15 mm/hr (ref 0–22)

## 2021-12-17 SURGERY — IRRIGATION AND DEBRIDEMENT FOOT
Anesthesia: General | Site: Foot | Laterality: Right

## 2021-12-17 MED ORDER — VANCOMYCIN HCL IN DEXTROSE 1-5 GM/200ML-% IV SOLN: 1000.0000 mg | Freq: Once | INTRAVENOUS | Status: DC

## 2021-12-17 MED ORDER — VANCOMYCIN HCL 1000 MG IV SOLR
INTRAVENOUS | Status: AC
  Filled 2021-12-17: qty 20

## 2021-12-17 MED ORDER — SODIUM CHLORIDE 0.9 % IV SOLN
INTRAVENOUS | Status: DC
Start: 2021-12-17 — End: 2021-12-20

## 2021-12-17 MED ORDER — SUGAMMADEX SODIUM 500 MG/5ML IV SOLN
INTRAVENOUS | Status: DC | PRN
  Administered 2021-12-17: 250 mg via INTRAVENOUS

## 2021-12-17 MED ORDER — VANCOMYCIN HCL 1500 MG/300ML IV SOLN
1500.0000 mg | Freq: Once | INTRAVENOUS | Status: DC
  Filled 2021-12-17: qty 300

## 2021-12-17 MED ORDER — SPIRONOLACTONE 25 MG PO TABS
25.0000 mg | ORAL_TABLET | Freq: Every day | ORAL | Status: DC
  Administered 2021-12-18 – 2021-12-21 (×2): 25 mg via ORAL
  Filled 2021-12-17 (×3): qty 1

## 2021-12-17 MED ORDER — INSULIN ASPART 100 UNIT/ML IJ SOLN
0.0000 [IU] | INTRAMUSCULAR | Status: DC
  Administered 2021-12-18 – 2021-12-20 (×2): 2 [IU] via SUBCUTANEOUS
  Administered 2021-12-21 (×5): 3 [IU] via SUBCUTANEOUS
  Administered 2021-12-22: 2 [IU] via SUBCUTANEOUS
  Administered 2021-12-22: 5 [IU] via SUBCUTANEOUS
  Administered 2021-12-22 (×3): 3 [IU] via SUBCUTANEOUS
  Administered 2021-12-22: 5 [IU] via SUBCUTANEOUS
  Administered 2021-12-23 (×2): 2 [IU] via SUBCUTANEOUS
  Filled 2021-12-17: qty 0.15

## 2021-12-17 MED ORDER — OXYCODONE HCL 5 MG PO TABS
10.0000 mg | ORAL_TABLET | Freq: Once | ORAL | Status: AC
  Administered 2021-12-17: 10 mg via ORAL
  Filled 2021-12-17: qty 2

## 2021-12-17 MED ORDER — ONDANSETRON HCL 4 MG/2ML IJ SOLN: 4.0000 mg | Freq: Four times a day (QID) | INTRAMUSCULAR | Status: DC | PRN

## 2021-12-17 MED ORDER — PHENYLEPHRINE HCL-NACL 20-0.9 MG/250ML-% IV SOLN
INTRAVENOUS | Status: DC | PRN
Start: 2021-12-17 — End: 2021-12-17
  Administered 2021-12-17: 25 ug/min via INTRAVENOUS

## 2021-12-17 MED ORDER — BICTEGRAVIR-EMTRICITAB-TENOFOV 50-200-25 MG PO TABS
1.0000 | ORAL_TABLET | Freq: Every day | ORAL | Status: DC
  Administered 2021-12-18 – 2021-12-21 (×2): 1 via ORAL
  Filled 2021-12-17 (×5): qty 1

## 2021-12-17 MED ORDER — MORPHINE SULFATE ER 15 MG PO TBCR
15.0000 mg | EXTENDED_RELEASE_TABLET | Freq: Once | ORAL | Status: AC
  Administered 2021-12-17: 15 mg via ORAL
  Filled 2021-12-17: qty 1

## 2021-12-17 MED ORDER — PHENYLEPHRINE HCL (PRESSORS) 10 MG/ML IV SOLN
INTRAVENOUS | Status: AC
  Filled 2021-12-17: qty 1

## 2021-12-17 MED ORDER — LACTATED RINGERS IV SOLN: INTRAVENOUS | Status: DC | PRN

## 2021-12-17 MED ORDER — ALBUTEROL SULFATE (2.5 MG/3ML) 0.083% IN NEBU
2.5000 mg | INHALATION_SOLUTION | Freq: Once | RESPIRATORY_TRACT | Status: AC
  Administered 2021-12-17: 2.5 mg via RESPIRATORY_TRACT

## 2021-12-17 MED ORDER — LINEZOLID 600 MG/300ML IV SOLN
600.0000 mg | Freq: Two times a day (BID) | INTRAVENOUS | Status: DC
  Administered 2021-12-17: 600 mg via INTRAVENOUS
  Filled 2021-12-17 (×2): qty 300

## 2021-12-17 MED ORDER — ACETAMINOPHEN 650 MG RE SUPP
650.0000 mg | Freq: Four times a day (QID) | RECTAL | Status: DC | PRN
Start: 2021-12-17 — End: 2022-01-14

## 2021-12-17 MED ORDER — BICTEGRAVIR-EMTRICITAB-TENOFOV 50-200-25 MG PO TABS: 1.0000 | ORAL_TABLET | Freq: Every day | ORAL | Status: DC

## 2021-12-17 MED ORDER — ONDANSETRON HCL 4 MG/2ML IJ SOLN
INTRAMUSCULAR | Status: AC
  Filled 2021-12-17: qty 2

## 2021-12-17 MED ORDER — ONDANSETRON HCL 4 MG/2ML IJ SOLN
INTRAMUSCULAR | Status: DC | PRN
  Administered 2021-12-17: 4 mg via INTRAVENOUS

## 2021-12-17 MED ORDER — SODIUM CHLORIDE 0.9 % IV SOLN: 1.0000 g | Freq: Once | INTRAVENOUS | Status: DC

## 2021-12-17 MED ORDER — HYDROMORPHONE HCL 1 MG/ML IJ SOLN
0.5000 mg | INTRAMUSCULAR | Status: DC | PRN
  Administered 2021-12-18 – 2021-12-20 (×7): 1 mg via INTRAVENOUS
  Filled 2021-12-17 (×7): qty 1

## 2021-12-17 MED ORDER — ALBUTEROL SULFATE (2.5 MG/3ML) 0.083% IN NEBU
INHALATION_SOLUTION | RESPIRATORY_TRACT | Status: AC
  Administered 2021-12-17: 2.5 mg via RESPIRATORY_TRACT
  Filled 2021-12-17: qty 3

## 2021-12-17 MED ORDER — VANCOMYCIN HCL 1000 MG IV SOLR
INTRAVENOUS | Status: DC | PRN
  Administered 2021-12-17: 1000 mg via TOPICAL

## 2021-12-17 MED ORDER — PROPOFOL 10 MG/ML IV BOLUS
INTRAVENOUS | Status: DC | PRN
  Administered 2021-12-17: 150 mg via INTRAVENOUS
  Administered 2021-12-17: 30 mg via INTRAVENOUS

## 2021-12-17 MED ORDER — CLINDAMYCIN HCL 300 MG PO CAPS
300.0000 mg | ORAL_CAPSULE | Freq: Once | ORAL | Status: DC
Start: 2021-12-17 — End: 2021-12-17

## 2021-12-17 MED ORDER — GADOBUTROL 1 MMOL/ML IV SOLN
10.0000 mL | Freq: Once | INTRAVENOUS | Status: AC | PRN
  Administered 2021-12-17: 10 mL via INTRAVENOUS

## 2021-12-17 MED ORDER — SODIUM CHLORIDE 0.9 % IV SOLN
2.0000 g | Freq: Two times a day (BID) | INTRAVENOUS | Status: DC
  Administered 2021-12-17: 2 g via INTRAVENOUS
  Filled 2021-12-17: qty 12.5

## 2021-12-17 MED ORDER — ROCURONIUM BROMIDE 10 MG/ML (PF) SYRINGE
PREFILLED_SYRINGE | INTRAVENOUS | Status: DC | PRN
  Administered 2021-12-17: 70 mg via INTRAVENOUS

## 2021-12-17 MED ORDER — ACETAMINOPHEN 325 MG PO TABS
650.0000 mg | ORAL_TABLET | Freq: Four times a day (QID) | ORAL | Status: DC | PRN
  Administered 2021-12-23 – 2021-12-25 (×4): 650 mg via ORAL
  Filled 2021-12-17 (×4): qty 2

## 2021-12-17 MED ORDER — METRONIDAZOLE 500 MG/100ML IV SOLN
500.0000 mg | Freq: Two times a day (BID) | INTRAVENOUS | Status: AC
  Administered 2021-12-17: 500 mg via INTRAVENOUS
  Filled 2021-12-17: qty 100

## 2021-12-17 MED ORDER — PHENYLEPHRINE HCL (PRESSORS) 10 MG/ML IV SOLN
INTRAVENOUS | Status: DC | PRN
  Administered 2021-12-17 (×2): 240 ug via INTRAVENOUS

## 2021-12-17 MED ORDER — SODIUM CHLORIDE 0.9 % IV SOLN
2.0000 g | Freq: Two times a day (BID) | INTRAVENOUS | Status: DC
  Administered 2021-12-18 – 2021-12-19 (×4): 2 g via INTRAVENOUS
  Filled 2021-12-17 (×4): qty 12.5

## 2021-12-17 MED ORDER — ONDANSETRON HCL 4 MG PO TABS
4.0000 mg | ORAL_TABLET | Freq: Four times a day (QID) | ORAL | Status: DC | PRN
Start: 2021-12-17 — End: 2022-01-14

## 2021-12-17 MED ORDER — FENTANYL CITRATE (PF) 100 MCG/2ML IJ SOLN
INTRAMUSCULAR | Status: AC
  Filled 2021-12-17: qty 2

## 2021-12-17 MED ORDER — ALBUTEROL SULFATE HFA 108 (90 BASE) MCG/ACT IN AERS
INHALATION_SPRAY | RESPIRATORY_TRACT | Status: AC
  Filled 2021-12-17: qty 6.7

## 2021-12-17 MED ORDER — SODIUM CHLORIDE 0.9 % IR SOLN
Status: DC | PRN
  Administered 2021-12-17: 3000 mL

## 2021-12-17 MED ORDER — MIDAZOLAM HCL 5 MG/5ML IJ SOLN
INTRAMUSCULAR | Status: DC | PRN
Start: 2021-12-17 — End: 2021-12-17
  Administered 2021-12-17: 2 mg via INTRAVENOUS

## 2021-12-17 MED ORDER — HYDROMORPHONE HCL 1 MG/ML IJ SOLN
INTRAMUSCULAR | Status: AC
  Administered 2021-12-17: 0.25 mg via INTRAVENOUS
  Filled 2021-12-17: qty 1

## 2021-12-17 MED ORDER — SODIUM CHLORIDE 0.9 % IV SOLN
2.0000 g | Freq: Once | INTRAVENOUS | Status: AC
  Administered 2021-12-17: 2 g via INTRAVENOUS
  Filled 2021-12-17: qty 20

## 2021-12-17 MED ORDER — HYDROMORPHONE HCL 1 MG/ML IJ SOLN
1.0000 mg | Freq: Once | INTRAMUSCULAR | Status: AC
  Administered 2021-12-17: 1 mg via INTRAVENOUS
  Filled 2021-12-17: qty 1

## 2021-12-17 MED ORDER — CLINDAMYCIN HCL 300 MG PO CAPS: 600.0000 mg | ORAL_CAPSULE | Freq: Three times a day (TID) | ORAL | Status: DC

## 2021-12-17 MED ORDER — FAMOTIDINE 20 MG PO TABS
40.0000 mg | ORAL_TABLET | Freq: Every day | ORAL | Status: DC
  Administered 2021-12-18 – 2021-12-21 (×2): 40 mg via ORAL
  Filled 2021-12-17 (×3): qty 2

## 2021-12-17 MED ORDER — METRONIDAZOLE 500 MG/100ML IV SOLN
500.0000 mg | Freq: Two times a day (BID) | INTRAVENOUS | Status: DC
  Administered 2021-12-18 – 2021-12-19 (×3): 500 mg via INTRAVENOUS
  Filled 2021-12-17 (×3): qty 100

## 2021-12-17 MED ORDER — ALBUTEROL SULFATE (2.5 MG/3ML) 0.083% IN NEBU
2.5000 mg | INHALATION_SOLUTION | Freq: Four times a day (QID) | RESPIRATORY_TRACT | Status: DC | PRN
  Administered 2021-12-20: 2.5 mg via RESPIRATORY_TRACT
  Filled 2021-12-17: qty 3

## 2021-12-17 MED ORDER — DILTIAZEM HCL ER COATED BEADS 120 MG PO CP24
240.0000 mg | ORAL_CAPSULE | Freq: Once | ORAL | Status: AC
Start: 2021-12-17 — End: 2021-12-17
  Administered 2021-12-17: 240 mg via ORAL
  Filled 2021-12-17: qty 2

## 2021-12-17 MED ORDER — LIDOCAINE 2% (20 MG/ML) 5 ML SYRINGE
INTRAMUSCULAR | Status: DC | PRN
  Administered 2021-12-17: 100 mg via INTRAVENOUS

## 2021-12-17 MED ORDER — PROPOFOL 10 MG/ML IV BOLUS
INTRAVENOUS | Status: AC
  Filled 2021-12-17: qty 20

## 2021-12-17 MED ORDER — FENTANYL CITRATE (PF) 100 MCG/2ML IJ SOLN
INTRAMUSCULAR | Status: DC | PRN
  Administered 2021-12-17 (×2): 50 ug via INTRAVENOUS

## 2021-12-17 MED ORDER — DILTIAZEM HCL ER COATED BEADS 120 MG PO CP24
240.0000 mg | ORAL_CAPSULE | Freq: Every day | ORAL | Status: DC
  Administered 2021-12-18: 240 mg via ORAL
  Filled 2021-12-17 (×2): qty 1

## 2021-12-17 MED ORDER — DEXAMETHASONE 4 MG PO TABS
4.0000 mg | ORAL_TABLET | Freq: Once | ORAL | Status: AC
  Administered 2021-12-17: 4 mg via ORAL
  Filled 2021-12-17: qty 1

## 2021-12-17 MED ORDER — HYDROMORPHONE HCL 1 MG/ML IJ SOLN
0.2500 mg | INTRAMUSCULAR | Status: DC | PRN
  Administered 2021-12-17 – 2021-12-18 (×2): 0.25 mg via INTRAVENOUS

## 2021-12-17 MED ORDER — FENTANYL CITRATE (PF) 100 MCG/2ML IJ SOLN
INTRAMUSCULAR | Status: AC
Start: 2021-12-17 — End: ?
  Filled 2021-12-17: qty 2

## 2021-12-17 MED ORDER — LINEZOLID 600 MG/300ML IV SOLN
600.0000 mg | Freq: Two times a day (BID) | INTRAVENOUS | Status: DC
Start: 2021-12-18 — End: 2021-12-22
  Administered 2021-12-18 – 2021-12-22 (×9): 600 mg via INTRAVENOUS
  Filled 2021-12-17 (×10): qty 300

## 2021-12-17 MED ORDER — DOCUSATE SODIUM 100 MG PO CAPS
100.0000 mg | ORAL_CAPSULE | Freq: Two times a day (BID) | ORAL | Status: DC
  Administered 2021-12-18 (×2): 100 mg via ORAL
  Filled 2021-12-17 (×3): qty 1

## 2021-12-17 MED ORDER — MIDAZOLAM HCL 2 MG/2ML IJ SOLN
INTRAMUSCULAR | Status: AC
  Filled 2021-12-17: qty 2

## 2021-12-17 MED ORDER — ALBUTEROL SULFATE HFA 108 (90 BASE) MCG/ACT IN AERS
INHALATION_SPRAY | RESPIRATORY_TRACT | Status: DC | PRN
  Administered 2021-12-17: 6 via RESPIRATORY_TRACT
  Administered 2021-12-17: 3 via RESPIRATORY_TRACT

## 2021-12-17 SURGICAL SUPPLY — 60 items
APL PRP STRL LF DISP 70% ISPRP (MISCELLANEOUS) ×4
BAG COUNTER SPONGE SURGICOUNT (BAG) IMPLANT
BAG SPEC THK2 15X12 ZIP CLS (MISCELLANEOUS) ×2
BAG SPNG CNTER NS LX DISP (BAG)
BAG ZIPLOCK 12X15 (MISCELLANEOUS) ×3 IMPLANT
BLADE OSCILLATING/SAGITTAL (BLADE)
BLADE SURG 15 STRL LF DISP TIS (BLADE) ×6 IMPLANT
BLADE SURG 15 STRL SS (BLADE) ×9
BLADE SW THK.38XMED LNG THN (BLADE) ×1 IMPLANT
BNDG CMPR 9X4 STRL LF SNTH (GAUZE/BANDAGES/DRESSINGS)
BNDG CONFORM 3 STRL LF (GAUZE/BANDAGES/DRESSINGS) ×1 IMPLANT
BNDG ELASTIC 4X5.8 VLCR STR LF (GAUZE/BANDAGES/DRESSINGS) ×2 IMPLANT
BNDG ESMARK 4X9 LF (GAUZE/BANDAGES/DRESSINGS) ×1 IMPLANT
CANISTER WOUND CARE 500ML ATS (WOUND CARE) IMPLANT
CHLORAPREP W/TINT 26 (MISCELLANEOUS) ×5 IMPLANT
CNTNR URN SCR LID CUP LEK RST (MISCELLANEOUS) ×2 IMPLANT
CONT SPEC 4OZ STRL OR WHT (MISCELLANEOUS) ×6
COVER SURGICAL LIGHT HANDLE (MISCELLANEOUS) ×3 IMPLANT
CUFF TOURN SGL QUICK 34 (TOURNIQUET CUFF)
CUFF TRNQT CYL 34X4.125X (TOURNIQUET CUFF) IMPLANT
DRAPE C-ARM 42X120 X-RAY (DRAPES) IMPLANT
DRAPE EXTREMITY T 121X128X90 (DISPOSABLE) ×3 IMPLANT
DRAPE OEC MINIVIEW 54X84 (DRAPES) ×1 IMPLANT
DRAPE SHEET LG 3/4 BI-LAMINATE (DRAPES) ×3 IMPLANT
DRAPE SURG 17X11 SM STRL (DRAPES) ×4 IMPLANT
DRAPE U-SHAPE 47X51 STRL (DRAPES) ×6 IMPLANT
DRSG ADAPTIC 3X8 NADH LF (GAUZE/BANDAGES/DRESSINGS) ×1 IMPLANT
DRSG KUZMA FLUFF (GAUZE/BANDAGES/DRESSINGS) ×4 IMPLANT
DRSG VAC ATS SM SENSATRAC (GAUZE/BANDAGES/DRESSINGS) ×2 IMPLANT
ELECT REM PT RETURN 15FT ADLT (MISCELLANEOUS) ×3 IMPLANT
GAUZE PACKING IODOFORM 1INX5YD (GAUZE/BANDAGES/DRESSINGS) ×1
GAUZE SPONGE 4X4 12PLY STRL (GAUZE/BANDAGES/DRESSINGS) ×5 IMPLANT
GAUZE XEROFORM 1X8 LF (GAUZE/BANDAGES/DRESSINGS) ×2 IMPLANT
GLOVE BIO SURGEON STRL SZ8 (GLOVE) ×3 IMPLANT
GLOVE BIOGEL PI IND STRL 7.5 (GLOVE) ×2 IMPLANT
GLOVE BIOGEL PI IND STRL 8 (GLOVE) ×2 IMPLANT
GLOVE BIOGEL PI INDICATOR 7.5 (GLOVE) ×1
GLOVE BIOGEL PI INDICATOR 8 (GLOVE) ×1
GLOVE SS BIOGEL STRL SZ 7.5 (GLOVE) ×2 IMPLANT
GLOVE SUPERSENSE BIOGEL SZ 7.5 (GLOVE) ×1
GOWN STRL REUS W/ TWL LRG LVL3 (GOWN DISPOSABLE) ×2 IMPLANT
GOWN STRL REUS W/TWL LRG LVL3 (GOWN DISPOSABLE) ×3
KIT BASIN OR (CUSTOM PROCEDURE TRAY) ×3 IMPLANT
KIT TURNOVER KIT A (KITS) ×2 IMPLANT
MARKER SKIN DUAL TIP RULER LAB (MISCELLANEOUS) ×1 IMPLANT
PACK ORTHO EXTREMITY (CUSTOM PROCEDURE TRAY) ×2 IMPLANT
PACK TOTAL JOINT (CUSTOM PROCEDURE TRAY) ×1 IMPLANT
PACKING GAUZE IODOFORM 1INX5YD (GAUZE/BANDAGES/DRESSINGS) ×1 IMPLANT
PAD CAST 4YDX4 CTTN HI CHSV (CAST SUPPLIES) ×1 IMPLANT
PADDING CAST COTTON 4X4 STRL (CAST SUPPLIES)
PROTECTOR NERVE ULNAR (MISCELLANEOUS) ×2 IMPLANT
SET IRRIG Y TYPE TUR BLADDER L (SET/KITS/TRAYS/PACK) ×3 IMPLANT
STOCKINETTE 8 INCH (MISCELLANEOUS) ×3 IMPLANT
SUT ETHILON 2 0 PS N (SUTURE) ×2 IMPLANT
SUT ETHILON 2 0 PSLX (SUTURE) ×2 IMPLANT
SUT ETHILON 3 0 PS 1 (SUTURE) ×1 IMPLANT
SUT NYLON 3 0 (SUTURE) ×1 IMPLANT
SUT PDS AB 2-0 CT2 27 (SUTURE) IMPLANT
SUT PDS AB 3-0 PS2 18 (SUTURE) IMPLANT
SUT PDS AB 3-0 SH 27 (SUTURE) IMPLANT

## 2021-12-17 NOTE — ED Triage Notes (Signed)
Pt brought over from [place] d/t wounds to right leg.  Pt has a large fluid filled blister visible above bandage.  ?

## 2021-12-17 NOTE — ED Provider Notes (Signed)
?Physical Exam  ?BP (!) 192/101 (BP Location: Right Arm)   Pulse 100   Temp (!) 97.5 ?F (36.4 ?C) (Oral)   Resp 20   Ht 5' 0.5" (1.537 m)   Wt 102.1 kg   SpO2 95%   BMI 43.22 kg/m?  ? ?Physical Exam ?Vitals and nursing note reviewed.  ?Constitutional:   ?   General: She is not in acute distress. ?   Appearance: She is well-developed.  ?HENT:  ?   Head: Normocephalic and atraumatic.  ?Eyes:  ?   Conjunctiva/sclera: Conjunctivae normal.  ?Cardiovascular:  ?   Rate and Rhythm: Normal rate and regular rhythm.  ?   Heart sounds: No murmur heard. ?Pulmonary:  ?   Effort: Pulmonary effort is normal. No respiratory distress.  ?   Breath sounds: Normal breath sounds.  ?Abdominal:  ?   Palpations: Abdomen is soft.  ?   Tenderness: There is no abdominal tenderness.  ?Musculoskeletal:     ?   General: No swelling.  ?   Cervical back: Neck supple.  ?Skin: ?   General: Skin is warm and dry.  ?   Capillary Refill: Capillary refill takes less than 2 seconds.  ?   Findings: Lesion present.  ?Neurological:  ?   Mental Status: She is alert.  ?Psychiatric:     ?   Mood and Affect: Mood normal.  ? ? ?Procedures  ?Marland KitchenCritical Care ?Performed by: Teressa Lower, MD ?Authorized by: Teressa Lower, MD  ? ?Critical care provider statement:  ?  Critical care time (minutes):  30 ?  Critical care was time spent personally by me on the following activities:  Development of treatment plan with patient or surrogate, discussions with consultants, evaluation of patient's response to treatment, examination of patient, ordering and review of laboratory studies, ordering and review of radiographic studies, ordering and performing treatments and interventions, pulse oximetry, re-evaluation of patient's condition and review of old charts ? ?ED Course / MDM  ? ?Clinical Course as of 12/17/21 2224  ?Wed Dec 17, 2021  ?1307 I discussed case with Dr. Alvan Dame. He advises her to be admitted to [facility] for Dr. Sharol Given to manage. I discussed with Hilbert Odor  at [facility], but he notes Dr. Sharol Given is out of town until 5/1. Thus I will consult Dr. Sharol Given back. [SG]  ?1345 I discussed again with Dr. Alvan Dame. He will check with his partners to see who can do the surgery/debridement. Will let me know if she needs admission to [facility] or [facility]. For now will continue supportive care [SG]  ?1505 Pending hospitalist call, concern for gas gangrene of the foot, pending Dr. Alvan Dame [MK]  ?  ?Clinical Course User Index ?[MK] Derriona Branscom, Debe Coder, MD ?[SG] Sherwood Gambler, MD  ? ?Medical Decision Making ?Amount and/or Complexity of Data Reviewed ?Labs: ordered. ?Radiology: ordered. ? ?Risk ?Prescription drug management. ?Decision regarding hospitalization. ? ? ?Patient received in handoff.  Immunocompromise patient with new bullous infection of the foot.  X-rays with gas.  CT obtained to rule out necrotizing fasciitis and there is concern for necrotizing infection on CT.  At time of signout, patient was admitted to the internal medicine service, however, patient will be unable to get a bed at [facility] for over 24 hours and thus the internal medicine service is recommending hospitalist admission at [facility].  I spoke with the orthopedic surgeon and the admitting [facility] hospitalist and they are requesting stat MRIs and likely operative intervention tonight.  Patient then admitted. ? ? ? ? ?  ?  Teressa Lower, MD ?12/17/21 2225 ? ?

## 2021-12-17 NOTE — ED Notes (Signed)
Patient transported to MRI at this time. 

## 2021-12-17 NOTE — Anesthesia Procedure Notes (Signed)
Procedure Name: Intubation ?Date/Time: 12/17/2021 9:49 PM ?Performed by: Lissa Morales, CRNA ?Pre-anesthesia Checklist: Patient identified, Emergency Drugs available, Suction available and Patient being monitored ?Patient Re-evaluated:Patient Re-evaluated prior to induction ?Oxygen Delivery Method: Circle system utilized ?Preoxygenation: Pre-oxygenation with 100% oxygen ?Induction Type: IV induction ?Ventilation: Mask ventilation without difficulty and Oral airway inserted - appropriate to patient size ?Laryngoscope Size: Mac and 4 ?Tube type: Oral ?Tube size: 7.0 mm ?Number of attempts: 1 ?Airway Equipment and Method: Stylet and Oral airway ?Placement Confirmation: ETT inserted through vocal cords under direct vision, positive ETCO2 and breath sounds checked- equal and bilateral ?Secured at: 22 cm ?Tube secured with: Tape ?Dental Injury: Teeth and Oropharynx as per pre-operative assessment  ? ? ? ? ?

## 2021-12-17 NOTE — Progress Notes (Signed)
Pharmacy Antibiotic Note ? ?Shannon [name] is a 66 y.o. female with hx metastatic recurrent non-small cell lung cancer who was transferred from [facility] to [facility] ED on 12/17/21  for evaluation for right leg wound/blisters.  Right LE CT and xray showed findings consistent with necrotizing soft tissue infection and "infection with gas producing organisms, gas gangrene".  Pharmacy has been consulted to dose cefepime for necrotizing fasciitis. ? ?Today, 12/17/2021: ?- scr 1.86 (crcl~32) ?- wbc 11.5 ? ?Plan: ?- cefepime 2gm IV q12h ?- linezolid 600 mg IV q12h per MD ?- flagyl 500 mg q12h per MD ? ?__________________________________ ? ?Height: 5' 0.5" (153.7 cm) ?Weight: 102.1 kg (225 lb) ?IBW/kg (Calculated) : 46.65 ? ?Temp (24hrs), Avg:97.8 ?F (36.6 ?C), Min:97.5 ?F (36.4 ?C), Max:98.3 ?F (36.8 ?C) ? ?Recent Labs  ?Lab 12/17/21 ?8546 12/17/21 ?1120  ?WBC 11.5*  --   ?CREATININE 1.86*  --   ?LATICACIDVEN  --  2.2*  ?  ?Estimated Creatinine Clearance: 31.9 mL/min (A) (by C-G formula based on SCr of 1.86 mg/dL (H)).   ? ?Allergies  ?Allergen Reactions  ? Lisinopril Swelling and Cough  ?  Face/throat swelling  ? Tree Extract Swelling and Other (See Comments)  ?  Swelling to eyes  ? Augmentin [Amoxicillin-Pot Clavulanate] Other (See Comments)  ?  Headache, dizzy  ? Ciprofloxacin Hives  ? ? ? ?Thank you for allowing pharmacy to be a part of this patient?s care. ? Lynelle Doctor ?12/17/2021 6:09 PM ? ?

## 2021-12-17 NOTE — Progress Notes (Signed)
A consult was received from an ED physician for [place] per pharmacy dosing.  The patient's profile has been reviewed for ht/wt/allergies/indication/available labs.   ?A one time order has been placed for Vanco 1500mg  IV x 1.  Further antibiotics/pharmacy consults should be ordered by admitting physician if indicated.       ?                ?Shahram Alexopoulos S. Alford Highland, PharmD, BCPS ?Clinical Staff Pharmacist ?[email or web address] ?Thank you, ?Alford Highland, [employer] ?12/17/2021  11:27 AM ? ?

## 2021-12-17 NOTE — Transfer of Care (Signed)
Immediate Anesthesia Transfer of Care Note ? ?Patient: Shannon [name] ? ?Procedure(s) Performed: IRRIGATION AND DEBRIDEMENT, RIGHT FOOT AND CALF (Right: Foot) ? ?Patient Location: PACU ? ?Anesthesia Type:General ? ?Level of Consciousness: awake and patient cooperative ? ?Airway & Oxygen Therapy: Patient Spontanous Breathing and Patient connected to face mask oxygen ? ?Post-op Assessment: Report given to RN, Post -op Vital signs reviewed and stable and Patient moving all extremities X 4 ? ?Post vital signs: stable ? ?Last Vitals:  ?Vitals Value Taken Time  ?BP 184/114 12/17/21 2330  ?Temp 36.8 ?C 12/17/21 2330  ?Pulse 108 12/17/21 2335  ?Resp 26 12/17/21 2335  ?SpO2 100 % 12/17/21 2335  ?Vitals shown include unvalidated device data. ? ?Last Pain:  ?Vitals:  ? 12/17/21 2000  ?TempSrc:   ?PainSc: 1   ?   ? ?Patients Stated Pain Goal: 2 (12/17/21 2000) ? ?Complications: No notable events documented. ?

## 2021-12-17 NOTE — Anesthesia Preprocedure Evaluation (Addendum)
Anesthesia Evaluation  ?Patient identified by MRN, date of birth, ID band ?Patient awake ? ? ? ?Reviewed: ?Allergy & Precautions, NPO status , Patient's Chart, lab work & pertinent test results ? ?History of Anesthesia Complications ?Negative for: history of anesthetic complications ? ?Airway ?Mallampati: II ? ?TM Distance: >3 FB ?Neck ROM: Full ? ? ? Dental ? ?(+) Edentulous Upper, Missing,  ?  ?Pulmonary ?asthma , COPD,  COPD inhaler and oxygen dependent, former smoker,  ?  ? ?+ decreased breath sounds+ wheezing ? ? ? ? ? Cardiovascular ?hypertension, Pt. on medications ?+ CAD and +CHF  ?Normal cardiovascular exam ? ? ?  ?Neuro/Psych ?Anxiety negative neurological ROS ?   ? GI/Hepatic ?Neg liver ROS, GERD  Medicated and Controlled,  ?Endo/Other  ?Morbid obesity ? Renal/GU ?Renal InsufficiencyRenal disease  ?negative genitourinary ?  ?Musculoskeletal ? ?(+) Arthritis , GANGRENE OF RIGHT FOOT  ? Abdominal ?  ?Peds ? Hematology ? ?(+) HIV,   ?Anesthesia Other Findings ?Day of surgery medications reviewed with patient. ? Reproductive/Obstetrics ?negative OB ROS ? ?  ? ? ? ? ? ? ? ? ? ? ? ? ? ?  ?  ? ? ? ? ? ? ? ?Anesthesia Physical ?Anesthesia Plan ? ?ASA: 3 and emergent ? ?Anesthesia Plan: General  ? ?Post-op Pain Management: Ofirmev IV (intra-op)*  ? ?Induction: Intravenous ? ?PONV Risk Score and Plan: 3 and Treatment may vary due to age or medical condition, Ondansetron, Dexamethasone and Midazolam ? ?Airway Management Planned: Oral ETT ? ?Additional Equipment: None ? ?Intra-op Plan:  ? ?Post-operative Plan: Extubation in OR ? ?Informed Consent: I have reviewed the patients History and Physical, chart, labs and discussed the procedure including the risks, benefits and alternatives for the proposed anesthesia with the patient or authorized representative who has indicated his/her understanding and acceptance.  ? ? ? ?Dental advisory given ? ?Plan Discussed with: CRNA ? ?Anesthesia  Plan Comments:   ? ? ? ? ? ?Anesthesia Quick Evaluation ? ?

## 2021-12-17 NOTE — H&P (Signed)
?History and Physical  ? ? ?Shannon [name] OEU:[id number] DOB: [birth year] DOA: 12/17/2021 ? ?PCP: [name], MD  ?Patient coming from: Home/cancer center ? ?I have personally briefly reviewed patient's old medical records in [place] ? ?Chief Complaint: Worsening right lower extremity wound/ulcers/blisters ? ?HPI: Shannon [name] is a 66 y.o. female with medical history significant of metastatic recurrent non-small cell lung cancer (01/2020) s/p curative radiotherapy (03/2020), with recurrence in February 2023, asthma, anxiety, COPD, GERD, hypertension, hyperlipidemia and HIV was brought in to the emergency department from [facility] due to worsening of the right lower extremity wound.  Patient is a poor historian.  According to patient, she started having small blister on the dorsum of the right foot about 3 to 4 weeks ago.  For this she started medical attention in the ED on 12/02/2021.  According to her, she has been prescribed multiple antibiotics.  Currently patient is extremely emotional and cannot remember the names of her the dates of antibiotics and whether she ever completed any of those courses.  Nonetheless, she was noted to have worsening of the right lower extremity wound so she was sent to the ED today.  Patient denies any other complaint other than right lower extremity pain.  No fever, chills, sweating or any other complaint. ? ?ED Course: Upon arrival to ED, she was slightly tachycardic with slightly elevated blood pressure but she was afebrile and no tachypnea.  White cells were only 11.5.  Her CKD stage IIIa was at baseline.  She was diagnosed with right foot abscess and possible necrotizing fasciitis based on the CT of the lower extremity.  ED physician had consulted with orthopedics who recommended admission under hospitalist service and they we will proceed with surgical intervention tonight. ? ?Review of Systems: As per HPI otherwise negative.  ? ? ?Past Medical History:   ?Diagnosis Date  ? Anemia   ? Anxiety   ? HX PANIC ATTACKS  ? Arthritis   ? "starting to; in my hands" (07/09/2015)  ? Asthma   ? Atrial fibrillation ([place])   ? Atrial flutter, paroxysmal ([place])   ? Bloated abdomen   ? CFS (chronic fatigue syndrome)   ? Chewing difficulty   ? Chronic asthma with acute exacerbation   ? "I have chronic asthma all the time; sometimes exacerbations" (07/09/2015)  ? Chronic lower back pain   ? COPD (chronic obstructive pulmonary disease) ([place])   ? Cyst of right kidney   ? "3 of them; dx'd in ~ 01/2015"  ? Dyspnea   ? GERD (gastroesophageal reflux disease)   ? Heart murmur   ? History of blood transfusion   ? "related to my brain surgery I think"  ? History of pulmonary embolism 07/09/2015  ? History of radiation therapy 04/09/20-04/22/20  ? SBRT Left Lung, Dr. Gery Pray  ? History of radiation therapy   ? lumbar spine 10/08/2021-10/28/2021  Dr Gery Pray  ? HIV disease ([place])   ? Hyperlipidemia   ? Hypertension   ? Leg edema   ? Lipodystrophy   ? Mild CAD 2013  ? Multiple thyroid nodules   ? Osteopenia   ? Palpitations   ? Pneumonia 07/09/2015  ? Shingles   ? Vitamin B 12 deficiency   ? Vitamin D deficiency   ? ? ?Past Surgical History:  ?Procedure Laterality Date  ? ABDOMINAL HYSTERECTOMY    ? "robotic laparosopic"  ? BRAIN SURGERY  1974  ? "brain tumor; benign; on top  of my brain; got a plate in there"  ? BRAIN SURGERY    ? age 1- -"Tumor pushing my skullout"  ? BRONCHIAL BIOPSY  02/13/2020  ? Procedure: BRONCHIAL BIOPSIES;  Surgeon: [name], MD;  Location: [facility] ENDOSCOPY;  Service: Pulmonary;;  ? BRONCHIAL BRUSHINGS  02/13/2020  ? Procedure: BRONCHIAL BRUSHINGS;  Surgeon: [name], MD;  Location: [facility] ENDOSCOPY;  Service: Pulmonary;;  ? BRONCHIAL NEEDLE ASPIRATION BIOPSY  02/13/2020  ? Procedure: BRONCHIAL NEEDLE ASPIRATION BIOPSIES;  Surgeon: [name], MD;  Location: [facility] ENDOSCOPY;  Service: Pulmonary;;  ? CARDIAC CATHETERIZATION    ? FIDUCIAL MARKER PLACEMENT   02/13/2020  ? Procedure: FIDUCIAL MARKER PLACEMENT;  Surgeon: [name], MD;  Location: [facility] ENDOSCOPY;  Service: Pulmonary;;  ? TONSILLECTOMY AND ADENOIDECTOMY    ? VIDEO BRONCHOSCOPY WITH ENDOBRONCHIAL NAVIGATION N/A 02/13/2020  ? Procedure: VIDEO BRONCHOSCOPY WITH ENDOBRONCHIAL NAVIGATION;  Surgeon: [name], MD;  Location: [facility] ENDOSCOPY;  Service: Pulmonary;  Laterality: N/A;  ? ? ? reports that she has quit smoking. Her smoking use included cigarettes. She has never used smokeless tobacco. She reports that she does not currently use alcohol. She reports that she does not use drugs. ? ?Allergies  ?Allergen Reactions  ? Lisinopril Swelling and Cough  ?  Face/throat swelling  ? Tree Extract Swelling and Other (See Comments)  ?  Swelling to eyes  ? Augmentin [Amoxicillin-Pot Clavulanate] Other (See Comments)  ?  Headache, dizzy  ? Ciprofloxacin Hives  ? ? ?Family History  ?Problem Relation Age of Onset  ? Asthma Mother   ? Heart failure Mother   ?     cardiomyopathy  ? Thyroid disease Mother   ? Heart disease Mother   ? Obesity Mother   ? Hypertension Mother   ? Heart murmur Sister   ? Heart murmur Brother   ? Diabetes Sister   ? Thyroid disease Sister   ? Heart murmur Sister   ? ? ?Prior to Admission medications   ?Medication Sig Start Date End Date Taking? Authorizing Provider  ?A&D OINT Apply 1 application. topically daily as needed (rash).   Yes [provider]  ?albuterol (PROVENTIL) (2.5 MG/3ML) 0.083% nebulizer solution USE 1 VIAL VIA NEBULIZER EVERY 4 HOURS AS NEEDED FOR WHEEZING OR SHORTNESS OF BREATH ?Patient taking differently: Take 2.5 mg by nebulization every 6 (six) hours as needed for shortness of breath. 12/02/21  Yes Angelica Pou, MD  ?amoxicillin (AMOXIL) 500 MG tablet Take 1 tablet (500 mg total) by mouth 2 (two) times daily. 12/16/21  Yes [name], MD  ?Ascorbic Acid (VITAMIN C) 1000 MG tablet Take 1,000 mg by mouth every other day.    Yes [provider]   ?b complex vitamins tablet Take 1 tablet by mouth daily.   Yes [provider]  ?BIKTARVY 50-200-25 MG TABS tablet TAKE 1 TABLET BY MOUTH DAILY 09/01/21  Yes [name], MD  ?BIOTIN PO Take 1 tablet by mouth every other day.   Yes [provider]  ?Calcium Citrate-Vitamin D (CALCIUM + D PO) Take 1 tablet by mouth every other day.   Yes [provider]  ?CALCIUM PO Take 1 tablet by mouth daily.   Yes [provider]  ?cyanocobalamin (,VITAMIN B-12,) 1000 MCG/ML injection ADMINISTER 1 ML(1000 MCG) IN THE MUSCLE EVERY 30 DAYS ?Patient taking differently: Inject 1,000 mcg into the muscle every 30 (thirty) days. 11/12/21  Yes [name], MD  ?dexamethasone (DECADRON) 4  MG tablet Take 1 tablet (4 mg total) by mouth 2 (two) times daily with a meal. 12/16/21  Yes [name], MD  ?diltiazem (CARDIZEM CD) 240 MG 24 hr capsule TAKE 1 CAPSULE(240 MG) BY MOUTH DAILY ?Patient taking differently: Take 240 mg by mouth daily. 07/12/21  Yes [name], MD  ?famotidine (PEPCID) 40 MG tablet Take 1 tablet (40 mg total) by mouth daily. 09/04/21  Yes Esterwood, Amy S, PA-C  ?magnesium hydroxide (MILK OF MAGNESIA) 400 MG/5ML suspension Take 15 mLs by mouth daily as needed for mild constipation.   Yes [provider]  ?Melatonin 5 MG TABS Take 5 mg by mouth at bedtime as needed (for sleep).   Yes [provider]  ?Naphazoline HCl (CLEAR EYES OP) Place 1 drop into both eyes daily as needed (redness).   Yes [provider]  ?neomycin-bacitracin-polymyxin (NEOSPORIN) ointment Apply 1 application. topically daily as needed for wound care.   Yes [provider]  ?Olopatadine HCl (PATADAY OP) Place 1 drop into both eyes daily as needed (burning).   Yes [provider]  ?Oxycodone HCl 10 MG TABS Take 10 mg by mouth every 6 (six) hours as needed (breakthrough pain). 09/30/21  Yes [provider]  ?OXYGEN Inhale 1.5-2 L into the lungs See admin  instructions. Uses at bedtime and through the day as needed   Yes [provider]  ?Phenylephrine-Cocoa Butter (PREPARATION H RE) Place 1 application. rectally daily as needed (hemorrhoids).   Yes Provider, Hist

## 2021-12-17 NOTE — Op Note (Addendum)
12/17/2021 ? ?11:40 PM ? ? ?PATIENT: Shannon [name]  66 y.o. female ? ?MRN: [id number] ? ? ?PRE-OPERATIVE DIAGNOSIS:   ?Right foot and leg gas gangrene ? ? ?POST-OPERATIVE DIAGNOSIS:   ?Right foot and leg gas gangrene ? ? ?PROCEDURE: ?1) Right foot incision and drainage of deep abscess ?2) Right leg incision and drainage of deep abscess ? ? ?SURGEON:  Armond Hang, MD ? ? ?ASSISTANT: None ? ? ?ANESTHESIA: General, regional ? ? ?EBL: Minimal ? ? ?TOURNIQUET:  None used ? ? ?COMPLICATIONS: None apparent ? ? ?DISPOSITION: Extubated, awake and stable to recovery. ? ? ?INDICATION FOR PROCEDURE: ?The patient presented with right foot gas gangrene with possible extension into distal leg as well as left leg pain, possible infection. Symptoms for 2+ wk with acute worsening over past 48 hours. PMH notable for metastatic recurrent non-small cell lung cancer (01/2020) s/p curative radiotherapy (03/2020), with recurrence in February 2023, asthma, anxiety, COPD, GERD, hypertension, hyperlipidemia and HIV. MRI of the bilateral legs and right foot were reviewed with interpreting radiologist on call Dr. Carmina Miller and right foot abscess with possible abscess in the right anteromedial leg were the pertinent findings.  ? ?We discussed the diagnosis, alternative treatment options, risks and benefits of the above surgical intervention, as well as alternative non-operative treatments. All questions/concerns were addressed and the patient/family demonstrated appropriate understanding of the diagnosis, the procedure, the postoperative course, and overall prognosis. The patient wished to proceed with surgical intervention and signed an informed surgical consent as such, in each others presence prior to surgery. ? ? ?PROCEDURE IN DETAIL: ?After preoperative consent was obtained and the correct operative site was identified, the patient was brought to the operating room supine on stretcher and transferred onto operating table. General  anesthesia was induced. Preoperative antibiotics were administered. Surgical timeout was taken. The patient was then positioned supine. The operative lower extremity was prepped and draped in standard sterile fashion. No tourniquet was used in this case. ? ?We began by decompressing the large serous blister over the right anteromedial mid leg. Clear serous fluid was drained from this blister. ? ?We then made a 3 cm longitudinal incision centered over the right dorsal foot abscess. Copious amount of necrotic slough, some frank purulence and interstitial fluid was evacuated from this wound. Sharp debridement was performed and rongeurs/curettes used to thoroughly debride the deep wound.  ? ?We then made a 3 cm longitudinal incision over the anteromedial distal tibia in the region of maximal fluid seen on MRI. Similar to the foot, copious amount of necrotic slough, some frank purulence and interstitial fluid was evacuated from this right leg wound. Fascial biopsy was obtained and sent for pathology analysis. Sharp debridement was performed and rongeurs/curettes used to thoroughly debride the deep wound. We used cobb elevator and Yankauer suction tip to thoroughly drain proximal and distal to this level.  ? ?The surgical sites were thoroughly irrigated. Hemostasis was achieved. Povidone iodine solution was instilled in both wounds and allowed to sit. Following this, we again thoroughly irrigated the wounds. Finally, we applied vancomycin powder to both wounds.  ? ?Of note, following the right lower extremity debridement, the overall limb was noted to be markedly improved in appearance with significant resolution of erythema and swelling.  ?  ?The leg was cleaned with saline and sterile iodoform gauze was packed into both wounds. A non-compressive sterile wrap was loosely applied. The patient was awakened from anesthesia and transported to the recovery room in stable condition.  ? ? ?  FOLLOW UP PLAN: ?-transfer to PACU,  then return to RNF  ?-heel weightbearing right lower extremity in CAM boot when ambulating, maximum elevation ?-wound care consult for daily packing changes of both right foot and leg wounds ?-VTE prophylaxis per hospitalist team preference, patient has a history of PE and metastatic carcinoma ?-trend intraoperative cultures ?-broad spectrum IV antibiotics including coverage for gas gangrene/necrotizing infection ?-nutrition consult ?-appreciate primary team care, ID recs, wound care team help ?-will continue to follow patient during admission, possible return to OR for repeat debridement if indicated ? ? ?RADIOGRAPHS: ?None obtained ? ? ?Armond Hang ?Orthopaedic Surgery ?[facility] ? ? ?

## 2021-12-17 NOTE — Consult Note (Addendum)
? ? ?Patient ID: ?Shannon [name] ?MRN: [id number] ?DOB/AGE: [birth year] 66 y.o. ? ?Admit date: 12/17/2021 ? ?Admission Diagnoses:  ?Principal Problem: ?  Abscess of foot ?Active Problems: ?  HIV disease ([place]) ?  Uncontrolled hypertension ?  Morbid (severe) obesity due to excess calories ([place]) ?  Atrial flutter ([place]) ?  Chronic anticoagulation ?  (HFpEF) heart failure with preserved ejection fraction ([place]) ?  Adenocarcinoma of lung ([place]) ?  Abscess of right foot ?  Necrotizing fasciitis ([place]) ? ? ?HPI: ?Patient currently admitted to [facility] ER with bilateral leg and right foot pain x 2 weeks with new onset blistering over right leg since Monday 12/15/21. The patient reports she has had right foot blood blister on the dorsum since Easter morning 11/30/21. Per her report, she assumed it was a skin issue and was waiting for it to subside. She sought medical attention when she started experiencing worsening pain in the distal legs as well as new blistering this week. She denies any thigh pain or elsewhere. She lives by herself in [place]. Her PMH is extensive: metastatic recurrent non-small cell lung cancer (01/2020) s/p curative radiotherapy (03/2020), with recurrence in February 2023, asthma, anxiety, COPD, GERD, hypertension, hyperlipidemia and HIV. She reports she is actively undergoing cancer treatment (chemo radiation) and told she has bone metastases. She denies fever/chills. She has been completely stable since arrival in the ER with severe hypertension and tachycardia. Due to her prior history of PE, she was supposed to be on Xarelto long term. However, she has not been taking this. She restarted one dose yesterday but has not taken anything further since.  ? ?Past Medical History: ?Past Medical History:  ?Diagnosis Date  ? Anemia   ? Anxiety   ? HX PANIC ATTACKS  ? Arthritis   ? "starting to; in my hands" (07/09/2015)  ? Asthma   ? Atrial fibrillation ([place])   ? Atrial flutter, paroxysmal ([place])   ? Bloated  abdomen   ? CFS (chronic fatigue syndrome)   ? Chewing difficulty   ? Chronic asthma with acute exacerbation   ? "I have chronic asthma all the time; sometimes exacerbations" (07/09/2015)  ? Chronic lower back pain   ? COPD (chronic obstructive pulmonary disease) ([place])   ? Cyst of right kidney   ? "3 of them; dx'd in ~ 01/2015"  ? Dyspnea   ? GERD (gastroesophageal reflux disease)   ? Heart murmur   ? History of blood transfusion   ? "related to my brain surgery I think"  ? History of pulmonary embolism 07/09/2015  ? History of radiation therapy 04/09/20-04/22/20  ? SBRT Left Lung, Dr. Gery Pray  ? History of radiation therapy   ? lumbar spine 10/08/2021-10/28/2021  Dr Gery Pray  ? HIV disease ([place])   ? Hyperlipidemia   ? Hypertension   ? Leg edema   ? Lipodystrophy   ? Mild CAD 2013  ? Multiple thyroid nodules   ? Osteopenia   ? Palpitations   ? Pneumonia 07/09/2015  ? Shingles   ? Vitamin B 12 deficiency   ? Vitamin D deficiency   ? ? ?Surgical History: ?Past Surgical History:  ?Procedure Laterality Date  ? ABDOMINAL HYSTERECTOMY    ? "robotic laparosopic"  ? BRAIN SURGERY  1974  ? "brain tumor; benign; on top of my brain; got a plate in there"  ? BRAIN SURGERY    ? age 60- -"Tumor pushing my skullout"  ? BRONCHIAL BIOPSY  02/13/2020  ?  Procedure: BRONCHIAL BIOPSIES;  Surgeon: [name], MD;  Location: [facility] ENDOSCOPY;  Service: Pulmonary;;  ? BRONCHIAL BRUSHINGS  02/13/2020  ? Procedure: BRONCHIAL BRUSHINGS;  Surgeon: [name], MD;  Location: [facility] ENDOSCOPY;  Service: Pulmonary;;  ? BRONCHIAL NEEDLE ASPIRATION BIOPSY  02/13/2020  ? Procedure: BRONCHIAL NEEDLE ASPIRATION BIOPSIES;  Surgeon: [name], MD;  Location: [facility] ENDOSCOPY;  Service: Pulmonary;;  ? CARDIAC CATHETERIZATION    ? FIDUCIAL MARKER PLACEMENT  02/13/2020  ? Procedure: FIDUCIAL MARKER PLACEMENT;  Surgeon: [name], MD;  Location: [facility] ENDOSCOPY;  Service: Pulmonary;;  ? TONSILLECTOMY AND ADENOIDECTOMY    ? VIDEO BRONCHOSCOPY WITH  ENDOBRONCHIAL NAVIGATION N/A 02/13/2020  ? Procedure: VIDEO BRONCHOSCOPY WITH ENDOBRONCHIAL NAVIGATION;  Surgeon: [name], MD;  Location: [facility] ENDOSCOPY;  Service: Pulmonary;  Laterality: N/A;  ? ? ?Family History: ?Family History  ?Problem Relation Age of Onset  ? Asthma Mother   ? Heart failure Mother   ?     cardiomyopathy  ? Thyroid disease Mother   ? Heart disease Mother   ? Obesity Mother   ? Hypertension Mother   ? Heart murmur Sister   ? Heart murmur Brother   ? Diabetes Sister   ? Thyroid disease Sister   ? Heart murmur Sister   ? ? ?Social History: ?Social History  ? ?Socioeconomic History  ? Marital status: Single  ?  Spouse name: Not on file  ? Number of children: Not on file  ? Years of education: Not on file  ? Highest education level: Not on file  ?Occupational History  ? Occupation: student  ?Tobacco Use  ? Smoking status: Former  ?  Types: Cigarettes  ? Smokeless tobacco: Never  ? Tobacco comments:  ?  Pt states she smoked when she was in college many years ago.  ?Vaping Use  ? Vaping Use: Never used  ?Substance and Sexual Activity  ? Alcohol use: Not Currently  ?  Alcohol/week: 0.0 standard drinks  ?  Comment: Rarely.  ? Drug use: No  ? Sexual activity: Not Currently  ?  Partners: Male  ?  Comment: declined condoms  ?Other Topics Concern  ? Not on file  ?Social History Narrative  ? Originally from [place] then [place] now [place] 2016 approx  ? Disabled   ? Rare alcohol, never smoker, no drug use  ?   ?   ?   ? ?Social Determinants of Health  ? ?Financial Resource Strain: Not on file  ?Food Insecurity: No Food Insecurity  ? Worried About Charity fundraiser in the Last Year: Never true  ? Ran Out of Food in the Last Year: Never true  ?Transportation Needs: Not on file  ?Physical Activity: Inactive  ? Days of Exercise per Week: 0 days  ? Minutes of Exercise per Session: 0 min  ?Stress: Not on file  ?Social Connections: Not on file  ?Intimate Partner Violence: Not on file  ? ? ?Allergies: ?Lisinopril, Tree  extract, Augmentin [amoxicillin-pot clavulanate], and Ciprofloxacin ? ?Medications: ?I have reviewed the patient's current medications. ? ?Vital Signs: ?Patient Vitals for the past 24 hrs: ? BP Temp Temp src Pulse Resp SpO2 Height Weight  ?12/17/21 1500 (!) 192/101 (!) 97.5 ?F (36.4 ?C) Oral 100 20 95 % -- --  ?12/17/21 1459 -- -- -- (!) 102 -- 91 % -- --  ?12/17/21 1237 (!) 172/104 -- -- (!) 111 18 91 % -- --  ?12/17/21 1021 (!) 166/104 (!) 97.5 ?  F (36.4 ?C) Oral (!) 107 -- 100 % 5' 0.5" (1.537 m) 102.1 kg  ? ? ?Radiology: ?DG Chest 2 View ? ?Result Date: 12/17/2021 ?CLINICAL DATA:  Shortness of breath, edema in the lower extremities EXAM: CHEST - 2 VIEW COMPARISON:  12/02/2021 FINDINGS: Transverse diameter of heart is increased. There are no signs of pulmonary edema or new focal infiltrates. Fiduciary markers are noted in the left parahilar region. Linear densities seen in the left parahilar region with interval improvement. There are no new focal infiltrates or signs of pulmonary edema. There is no pleural effusion or pneumothorax. Expansile deformities seen in the few bilateral ribs has not changed. IMPRESSION: There is interval decrease in linear densities in the left parahilar region adjacent to the fiduciary markers. There are no signs of pulmonary edema or new focal pulmonary consolidation. Electronically Signed   By: Elmer Picker M.D.   On: 12/17/2021 12:27  ? ?DG Chest 2 View ? ?Result Date: 12/02/2021 ?CLINICAL DATA:  Shortness of breath EXAM: CHEST - 2 VIEW COMPARISON:  11/10/2021 FINDINGS: Cardiac shadow is enlarged but stable. Fiducial markers are again identified in the left mid lung. Stable density adjacent to the fiducial markers is seen. Expansile lesion of the right fifth rib posteriorly is again noted. The left rib lesion is less well appreciated on today's exam. Known metastatic disease in the thoracic spine is less well visualized on today's exam. No focal confluent infiltrate is seen.  IMPRESSION: Stable lesion in the left lung surrounded by fiducial markers. Stable right fifth rib lesion.  No new focal abnormality is noted. Electronically Signed   By: Inez Catalina M.D.   On: 12/02/2021 03:02  ?

## 2021-12-17 NOTE — ED Provider Notes (Signed)
?[place] DEPT ?Provider Note ? ? ?CSN: [id number] ?Arrival date & time: 12/17/21  1013 ? ?  ? ?History ? ?No chief complaint on file. ? ? ?Shannon [name] is a 66 y.o. female. ? ?HPI ?66 year old female sent from the cancer center for leg infection and need for antibiotics.  She has been dealing with swelling and blisters to her foot and leg for about 2+ weeks.  Originally seen here on 4/11.  She was told to no longer use heating pads though she also tells me she did not actually burned her foot.  She had a blister to the top of her foot for this whole time but now is starting to drain.  There is redness going up her leg and she has a new blister midway up her lower leg.  She denies fevers but has had chills at night. ? ?Some redness in the left leg but not nearly as bad.  She started feeling short of breath this morning and feels better when she sits up.  She has noticed bilateral leg swelling. ? ?She has multiple comorbidities which include HIV, obesity, CKD, CAD, atrial flutter on chronic anticoagulation. ? ?Home Medications ?Prior to Admission medications   ?Medication Sig Start Date End Date Taking? Authorizing Provider  ?A&D OINT Apply 1 application. topically daily as needed (rash).    [provider]  ?albuterol (PROVENTIL) (2.5 MG/3ML) 0.083% nebulizer solution USE 1 VIAL VIA NEBULIZER EVERY 4 HOURS AS NEEDED FOR WHEEZING OR SHORTNESS OF BREATH 12/02/21   Angelica Pou, MD  ?amoxicillin (AMOXIL) 500 MG tablet Take 1 tablet (500 mg total) by mouth 2 (two) times daily. 12/16/21   [name], MD  ?Ascorbic Acid (VITAMIN C) 1000 MG tablet Take 1,000 mg by mouth every other day.     [provider]  ?b complex vitamins tablet Take 1 tablet by mouth daily.    [provider]  ?BIKTARVY 50-200-25 MG TABS tablet TAKE 1 TABLET BY MOUTH DAILY 09/01/21   [name], MD  ?BIOTIN PO Take 1 tablet by mouth every other day.    [provider]   ?Calcium Citrate-Vitamin D (CALCIUM + D PO) Take 1 tablet by mouth every other day.    [provider]  ?CALCIUM PO Take 1 tablet by mouth daily.    [provider]  ?cyanocobalamin (,VITAMIN B-12,) 1000 MCG/ML injection ADMINISTER 1 ML(1000 MCG) IN THE MUSCLE EVERY 30 DAYS 11/12/21   [name], MD  ?dexamethasone (DECADRON) 4 MG tablet Take 1 tablet (4 mg total) by mouth 2 (two) times daily with a meal. 12/16/21   [name], MD  ?diltiazem (CARDIZEM CD) 240 MG 24 hr capsule TAKE 1 CAPSULE(240 MG) BY MOUTH DAILY 07/12/21   [name], MD  ?famotidine (PEPCID) 40 MG tablet Take 1 tablet (40 mg total) by mouth daily. 09/04/21   Esterwood, Amy S, PA-C  ?furosemide (LASIX) 20 MG tablet TAKE 2 TABLETS BY MOUTH TWICE A DAY FOR 5 DAYS THEN AS DIRECTED BY PHYSICIAN 11/26/21   Skeet Latch, MD  ?magnesium hydroxide (MILK OF MAGNESIA) 400 MG/5ML suspension Take 15 mLs by mouth daily as needed for mild constipation.    [provider]  ?Melatonin 5 MG TABS Take 5 mg by mouth at bedtime as needed (for sleep).    [provider]  ?Misc. Devices (PULSE OXIMETER FOR FINGER) MISC 1 Units by Does not apply route as needed. 01/19/19   Oval Linsey,  Tiffany, MD  ?morphine (MS CONTIN) 15 MG 12 hr tablet Take 1 tablet by mouth every 12 hours. ?Patient not taking: Reported on 11/27/2021 10/15/21   Gery Pray, MD  ?Mouthwashes (BIOTENE DRY MOUTH GENTLE) LIQD Use as directed 1 Dose in the mouth or throat 2 (two) times daily as needed (dry mouth). 02/13/20   [name], MD  ?Naphazoline HCl (CLEAR EYES OP) Place 1 drop into both eyes daily as needed (redness).    [provider]  ?neomycin-bacitracin-polymyxin (NEOSPORIN) ointment Apply 1 application. topically as needed for wound care.    [provider]  ?Olopatadine HCl (PATADAY OP) Place 1 drop into both eyes daily as needed (burning).    [provider]  ?ondansetron (ZOFRAN) 4 MG tablet Take 1 tablet (4 mg  total) by mouth every 8 (eight) hours as needed for nausea or vomiting. ?Patient not taking: Reported on 11/27/2021 10/15/21   Gery Pray, MD  ?Oxycodone HCl 10 MG TABS Take 10 mg by mouth every 6 (six) hours as needed (breakthrough pain). 09/30/21   [provider]  ?OXYGEN Inhale 1.5-2 L into the lungs See admin instructions. Uses at bedtime and through the day as needed ?Patient not taking: Reported on 11/27/2021    [provider]  ?Phenylephrine-Cocoa Butter (PREPARATION H RE) Place 1 application. rectally daily as needed (hemorrhoids).    [provider]  ?Polyethyl Glycol-Propyl Glycol (SYSTANE OP) Place 1 drop into both eyes daily as needed (dry eyes).    [provider]  ?potassium chloride (KLOR-CON) 10 MEQ tablet TAKE 4 TABLETS BY MOUTH DAILY FOR 5 DAYS ONLY 11/26/21   Skeet Latch, MD  ?spironolactone (ALDACTONE) 25 MG tablet Take 1 tablet (25 mg total) by mouth daily. 12/16/21 03/16/22  [name], MD  ?SYRINGE/NEEDLE, DISP, 1 ML (B-D SYRINGE/NEEDLE 1CC/25GX5/8) 25G X 5/8" 1 ML MISC 1 Units by Does not apply route every 30 (thirty) days. 04/14/19   [name], MD  ?Alveda Reasons 20 MG TABS tablet TAKE 1 TABLET(20 MG) BY MOUTH DAILY 07/24/21   Skeet Latch, MD  ?   ? ?Allergies    ?Lisinopril, Tree extract, Augmentin [amoxicillin-pot clavulanate], and Ciprofloxacin   ? ?Review of Systems   ?Review of Systems  ?Constitutional:  Positive for chills. Negative for fever.  ?Respiratory:  Positive for shortness of breath.   ?Cardiovascular:  Positive for leg swelling. Negative for chest pain.  ?Musculoskeletal:  Positive for myalgias.  ?Skin:  Positive for color change and wound.  ? ?Physical Exam ?Updated Vital Signs ?BP (!) 192/101 (BP Location: Right Arm)   Pulse 100   Temp (!) 97.5 ?F (36.4 ?C) (Oral)   Resp 20   Ht 5' 0.5" (1.537 m)   Wt 102.1 kg   SpO2 95%   BMI 43.22 kg/m?  ?Physical Exam ?Vitals and nursing note reviewed.  ?Constitutional:   ?   General:  She is not in acute distress. ?   Appearance: She is well-developed. She is obese. She is not diaphoretic.  ?HENT:  ?   Head: Normocephalic and atraumatic.  ?Cardiovascular:  ?   Rate and Rhythm: Regular rhythm. Tachycardia present.  ?   Heart sounds: Normal heart sounds.  ?Pulmonary:  ?   Effort: Pulmonary effort is normal.  ?Abdominal:  ?   Palpations: Abdomen is soft.  ?   Tenderness: There is no abdominal tenderness.  ?Musculoskeletal:  ?   Comments: See pictures of right foot and leg below.  She has  diffuse erythema.  There is a little bit of erythema on the left side but not nearly to the degree on the right.  She is diffusely tender though no crepitus.  Compartments are soft.  ?Skin: ?   General: Skin is warm and dry.  ?   Findings: Erythema present.  ?Neurological:  ?   Mental Status: She is alert.  ? ? ? ? ? ? ?ED Results / Procedures / Treatments   ?Labs ?(all labs ordered are listed, but only abnormal results are displayed) ?Labs Reviewed  ?BRAIN NATRIURETIC PEPTIDE - Abnormal; Notable for the following components:  ?    Result Value  ? B Natriuretic Peptide 155.2 (*)   ? All other components within normal limits  ?LACTIC ACID, PLASMA - Abnormal; Notable for the following components:  ? Lactic Acid, Venous 2.2 (*)   ? All other components within normal limits  ?CULTURE, BLOOD (ROUTINE X 2)  ?CULTURE, BLOOD (ROUTINE X 2)  ?LACTIC ACID, PLASMA  ?SEDIMENTATION RATE  ?C-REACTIVE PROTEIN  ? ? ?EKG ?None ? ?Radiology ?DG Chest 2 View ? ?Result Date: 12/17/2021 ?CLINICAL DATA:  Shortness of breath, edema in the lower extremities EXAM: CHEST - 2 VIEW COMPARISON:  12/02/2021 FINDINGS: Transverse diameter of heart is increased. There are no signs of pulmonary edema or new focal infiltrates. Fiduciary markers are noted in the left parahilar region. Linear densities seen in the left parahilar region with interval improvement. There are no new focal infiltrates or signs of pulmonary edema. There is no pleural effusion  or pneumothorax. Expansile deformities seen in the few bilateral ribs has not changed. IMPRESSION: There is interval decrease in linear densities in the left parahilar region adjacent to the fiduciary marker

## 2021-12-17 NOTE — Progress Notes (Signed)
? ?  ?Palliative Medicine ?[place]  ?Telephone:([phone]) [phone] [phone] ? ? ?Name: Shannon [name] ?Date: 12/17/2021 ?MRN: [id number]  ?DOB: May 29, 1956 ? ?Patient Care Team: ?[name], MD as PCP - General (Internal Medicine) ?Skeet Latch, MD as PCP - Cardiology (Cardiology) ?Campbell Riches, MD as Consulting Physician (Infectious Diseases) ?[place] ?[name], RN as [facility] Management ?Valrie Hart, RN as Oncology Nurse Navigator (Oncology)  ? ? ?REASON FOR CONSULTATION: ?Shannon [name] is a 66 y.o. female with medical history including metastatic recurrent non-small cell lung cancer (01/2020) s/p curative radiotherapy (03/2020), with recurrence in February 2023, asthma, anxiety, COPD, GERD, hypertension, hyperlipidemia, lower extremity edema, and HIV.  Palliative ask to see for symptom management and goals of care.  ? ? ?SOCIAL HISTORY:    ? reports that she has quit smoking. Her smoking use included cigarettes. She has never used smokeless tobacco. She reports that she does not currently use alcohol. She reports that she does not use drugs. ? ?ADVANCE DIRECTIVES:  ? ? ?CODE STATUS:  ? ?PAST MEDICAL HISTORY: ?Past Medical History:  ?Diagnosis Date  ? Anemia   ? Anxiety   ? HX PANIC ATTACKS  ? Arthritis   ? "starting to; in my hands" (07/09/2015)  ? Asthma   ? Atrial fibrillation ([place])   ? Atrial flutter, paroxysmal ([place])   ? Bloated abdomen   ? CFS (chronic fatigue syndrome)   ? Chewing difficulty   ? Chronic asthma with acute exacerbation   ? "I have chronic asthma all the time; sometimes exacerbations" (07/09/2015)  ? Chronic lower back pain   ? COPD (chronic obstructive pulmonary disease) ([place])   ? Cyst of right kidney   ? "3 of them; dx'd in ~ 01/2015"  ? Dyspnea   ? GERD (gastroesophageal reflux disease)   ? Heart murmur   ? History of blood transfusion   ? "related to my brain surgery I think"  ? History of  pulmonary embolism 07/09/2015  ? History of radiation therapy 04/09/20-04/22/20  ? SBRT Left Lung, Dr. Gery Pray  ? History of radiation therapy   ? lumbar spine 10/08/2021-10/28/2021  Dr Gery Pray  ? HIV disease ([place])   ? Hyperlipidemia   ? Hypertension   ? Leg edema   ? Lipodystrophy   ? Mild CAD 2013  ? Multiple thyroid nodules   ? Osteopenia   ? Palpitations   ? Pneumonia 07/09/2015  ? Shingles   ? Vitamin B 12 deficiency   ? Vitamin D deficiency   ? ? ?PAST SURGICAL HISTORY:  ?Past Surgical History:  ?Procedure Laterality Date  ? ABDOMINAL HYSTERECTOMY    ? "robotic laparosopic"  ? BRAIN SURGERY  1974  ? "brain tumor; benign; on top of my brain; got a plate in there"  ? BRAIN SURGERY    ? age 58- -"Tumor pushing my skullout"  ? BRONCHIAL BIOPSY  02/13/2020  ? Procedure: BRONCHIAL BIOPSIES;  Surgeon: [name], MD;  Location: [facility] ENDOSCOPY;  Service: Pulmonary;;  ? BRONCHIAL BRUSHINGS  02/13/2020  ? Procedure: BRONCHIAL BRUSHINGS;  Surgeon: [name], MD;  Location: [facility] ENDOSCOPY;  Service: Pulmonary;;  ? BRONCHIAL NEEDLE ASPIRATION BIOPSY  02/13/2020  ? Procedure: BRONCHIAL NEEDLE ASPIRATION BIOPSIES;  Surgeon: [name], MD;  Location: [facility] ENDOSCOPY;  Service: Pulmonary;;  ? CARDIAC CATHETERIZATION    ? FIDUCIAL MARKER PLACEMENT  02/13/2020  ? Procedure: FIDUCIAL MARKER PLACEMENT;  Surgeon: [name], MD;  Location: [facility] ENDOSCOPY;  Service: Pulmonary;;  ? TONSILLECTOMY AND ADENOIDECTOMY    ? VIDEO BRONCHOSCOPY WITH ENDOBRONCHIAL NAVIGATION N/A 02/13/2020  ? Procedure: VIDEO BRONCHOSCOPY WITH ENDOBRONCHIAL NAVIGATION;  Surgeon: [name], MD;  Location: [facility] ENDOSCOPY;  Service: Pulmonary;  Laterality: N/A;  ? ? ?HEMATOLOGY/ONCOLOGY HISTORY:  ?Oncology History  ? No history exists.  ? ? ?ALLERGIES:  is allergic to lisinopril, tree extract, augmentin [amoxicillin-pot clavulanate], and ciprofloxacin. ? ?MEDICATIONS:  ?No current facility-administered medications for this visit.  ? ?Current  Outpatient Medications  ?Medication Sig Dispense Refill  ? A&D OINT Apply 1 application. topically daily as needed (rash).    ? albuterol (PROVENTIL) (2.5 MG/3ML) 0.083% nebulizer solution USE 1 VIAL VIA NEBULIZER EVERY 4 HOURS AS NEEDED FOR WHEEZING OR SHORTNESS OF BREATH 75 mL 2  ? amoxicillin (AMOXIL) 500 MG tablet Take 1 tablet (500 mg total) by mouth 2 (two) times daily. 14 tablet 0  ? Ascorbic Acid (VITAMIN C) 1000 MG tablet Take 1,000 mg by mouth every other day.     ? b complex vitamins tablet Take 1 tablet by mouth daily.    ? BIKTARVY 50-200-25 MG TABS tablet TAKE 1 TABLET BY MOUTH DAILY 30 tablet 5  ? BIOTIN PO Take 1 tablet by mouth every other day.    ? Calcium Citrate-Vitamin D (CALCIUM + D PO) Take 1 tablet by mouth every other day.    ? CALCIUM PO Take 1 tablet by mouth daily.    ? cyanocobalamin (,VITAMIN B-12,) 1000 MCG/ML injection ADMINISTER 1 ML(1000 MCG) IN THE MUSCLE EVERY 30 DAYS 3 mL 1  ? dexamethasone (DECADRON) 4 MG tablet Take 1 tablet (4 mg total) by mouth 2 (two) times daily with a meal. 30 tablet 0  ? diltiazem (CARDIZEM CD) 240 MG 24 hr capsule TAKE 1 CAPSULE(240 MG) BY MOUTH DAILY 90 capsule 3  ? famotidine (PEPCID) 40 MG tablet Take 1 tablet (40 mg total) by mouth daily. 30 tablet 11  ? furosemide (LASIX) 20 MG tablet TAKE 2 TABLETS BY MOUTH TWICE A DAY FOR 5 DAYS THEN AS DIRECTED BY PHYSICIAN 60 tablet 0  ? magnesium hydroxide (MILK OF MAGNESIA) 400 MG/5ML suspension Take 15 mLs by mouth daily as needed for mild constipation.    ? Melatonin 5 MG TABS Take 5 mg by mouth at bedtime as needed (for sleep).    ? Misc. Devices (PULSE OXIMETER FOR FINGER) MISC 1 Units by Does not apply route as needed. 1 each 0  ? morphine (MS CONTIN) 15 MG 12 hr tablet Take 1 tablet by mouth every 12 hours. (Patient not taking: Reported on 11/27/2021) 30 tablet 0  ? Mouthwashes (BIOTENE DRY MOUTH GENTLE) LIQD Use as directed 1 Dose in the mouth or throat 2 (two) times daily as needed (dry mouth).    ?  Naphazoline HCl (CLEAR EYES OP) Place 1 drop into both eyes daily as needed (redness).    ? neomycin-bacitracin-polymyxin (NEOSPORIN) ointment Apply 1 application. topically as needed for wound care.    ? Olopatadine HCl (PATADAY OP) Place 1 drop into both eyes daily as needed (burning).    ? ondansetron (ZOFRAN) 4 MG tablet Take 1 tablet (4 mg total) by mouth every 8 (eight) hours as needed for nausea or vomiting. (Patient not taking: Reported on 11/27/2021) 20 tablet 0  ? Oxycodone HCl 10 MG TABS Take 10 mg by mouth every 6 (six) hours as needed (breakthrough pain).    ?  OXYGEN Inhale 1.5-2 L into the lungs See admin instructions. Uses at bedtime and through the day as needed (Patient not taking: Reported on 11/27/2021)    ? Phenylephrine-Cocoa Butter (PREPARATION H RE) Place 1 application. rectally daily as needed (hemorrhoids).    ? Polyethyl Glycol-Propyl Glycol (SYSTANE OP) Place 1 drop into both eyes daily as needed (dry eyes).    ? potassium chloride (KLOR-CON) 10 MEQ tablet TAKE 4 TABLETS BY MOUTH DAILY FOR 5 DAYS ONLY 30 tablet 0  ? spironolactone (ALDACTONE) 25 MG tablet Take 1 tablet (25 mg total) by mouth daily. 90 tablet 0  ? SYRINGE/NEEDLE, DISP, 1 ML (B-D SYRINGE/NEEDLE 1CC/25GX5/8) 25G X 5/8" 1 ML MISC 1 Units by Does not apply route every 30 (thirty) days. 50 each 0  ? XARELTO 20 MG TABS tablet TAKE 1 TABLET(20 MG) BY MOUTH DAILY 90 tablet 0  ? ?Facility-Administered Medications Ordered in Other Visits  ?Medication Dose Route Frequency Provider Last Rate Last Admin  ? bictegravir-emtricitabine-tenofovir AF (BIKTARVY) 50-200-25 MG per tablet 1 tablet  1 tablet Oral Daily Sherwood Gambler, MD      ? dexamethasone (DECADRON) tablet 4 mg  4 mg Oral Once Sherwood Gambler, MD      ? diltiazem (CARDIZEM CD) 24 hr capsule 240 mg  240 mg Oral Once Sherwood Gambler, MD      ? HYDROmorphone (DILAUDID) injection 1 mg  1 mg Intravenous Once Sherwood Gambler, MD      ? linezolid (ZYVOX) IVPB 600 mg  600 mg Intravenous  Q12H Sherwood Gambler, MD 300 mL/hr at 12/17/21 1315 600 mg at 12/17/21 1315  ? morphine (MS CONTIN) 12 hr tablet 15 mg  15 mg Oral Once Sherwood Gambler, MD      ? oxyCODONE (Oxy IR/ROXICODONE) immediate release t

## 2021-12-17 NOTE — H&P (Signed)
H&P Update: ? ?-History and Physical Reviewed ? ?-Patient has been re-examined ? ?-No change in the plan of care ? ?-The risks and benefits were presented and reviewed. The risks due to new/persistent infection, stiffness, nerve/vessel/tendon injury, wound healing issues, development of arthritis, failure of this surgery, possibility of delayed definitive closure, need for future skin graft/flap/other reconstructive procedure, need for wound vac, need for further surgery, thromboembolic events, anesthesia/medical complications, amputation, death among others were discussed. The patient acknowledged the explanation, agreed to proceed with the plan and a consent was signed. ? ?Shannon [name] ? ?

## 2021-12-18 ENCOUNTER — Encounter (HOSPITAL_COMMUNITY): Payer: Self-pay | Admitting: Orthopaedic Surgery

## 2021-12-18 ENCOUNTER — Ambulatory Visit: Payer: Self-pay | Admitting: Radiation Oncology

## 2021-12-18 DIAGNOSIS — L02619 Cutaneous abscess of unspecified foot: Secondary | ICD-10-CM | POA: Diagnosis not present

## 2021-12-18 LAB — COMPREHENSIVE METABOLIC PANEL
ALT: 38 U/L (ref 0–44)
AST: 18 U/L (ref 15–41)
Albumin: 2.9 g/dL — ABNORMAL LOW (ref 3.5–5.0)
Alkaline Phosphatase: 73 U/L (ref 38–126)
Anion gap: 6 (ref 5–15)
BUN: 42 mg/dL — ABNORMAL HIGH (ref 8–23)
CO2: 32 mmol/L (ref 22–32)
Calcium: 10.3 mg/dL (ref 8.9–10.3)
Chloride: 104 mmol/L (ref 98–111)
Creatinine, Ser: 1.58 mg/dL — ABNORMAL HIGH (ref 0.44–1.00)
GFR, Estimated: 36 mL/min — ABNORMAL LOW (ref >60)
Glucose, Bld: 124 mg/dL — ABNORMAL HIGH (ref 70–99)
Potassium: 5 mmol/L (ref 3.5–5.1)
Sodium: 142 mmol/L (ref 135–145)
Total Bilirubin: 0.4 mg/dL (ref 0.3–1.2)
Total Protein: 6 g/dL — ABNORMAL LOW (ref 6.5–8.1)

## 2021-12-18 LAB — GLUCOSE, CAPILLARY
Glucose-Capillary: 124 mg/dL — ABNORMAL HIGH (ref 70–99)
Glucose-Capillary: 89 mg/dL (ref 70–99)
Glucose-Capillary: 83 mg/dL (ref 70–99)
Glucose-Capillary: 92 mg/dL (ref 70–99)
Glucose-Capillary: 90 mg/dL (ref 70–99)

## 2021-12-18 LAB — CBC
HCT: 39.2 % (ref 36.0–46.0)
Hemoglobin: 12.2 g/dL (ref 12.0–15.0)
MCH: 32.4 pg (ref 26.0–34.0)
MCHC: 31.1 g/dL (ref 30.0–36.0)
MCV: 104 fL — ABNORMAL HIGH (ref 80.0–100.0)
Platelets: 163 10*3/uL (ref 150–400)
RBC: 3.77 MIL/uL — ABNORMAL LOW (ref 3.87–5.11)
RDW: 13.7 % (ref 11.5–15.5)
WBC: 10.4 10*3/uL (ref 4.0–10.5)
nRBC: 0 % (ref 0.0–0.2)

## 2021-12-18 LAB — HEMOGLOBIN A1C
Hgb A1c MFr Bld: 7.3 % — ABNORMAL HIGH (ref 4.8–5.6)
Mean Plasma Glucose: 162.81 mg/dL

## 2021-12-18 LAB — MAGNESIUM: Magnesium: 2.2 mg/dL (ref 1.7–2.4)

## 2021-12-18 LAB — C-REACTIVE PROTEIN: CRP: 8 mg/dL — ABNORMAL HIGH (ref <1.0)

## 2021-12-18 MED ORDER — LIP MEDEX EX OINT: TOPICAL_OINTMENT | CUTANEOUS | Status: DC | PRN

## 2021-12-18 MED ORDER — ENOXAPARIN SODIUM 100 MG/ML IJ SOSY
100.0000 mg | PREFILLED_SYRINGE | Freq: Two times a day (BID) | INTRAMUSCULAR | Status: DC
Start: 2021-12-18 — End: 2021-12-25
  Administered 2021-12-18 – 2021-12-25 (×14): 100 mg via SUBCUTANEOUS
  Filled 2021-12-18 (×14): qty 1

## 2021-12-18 MED ORDER — ZINC SULFATE 220 (50 ZN) MG PO CAPS
220.0000 mg | ORAL_CAPSULE | Freq: Every day | ORAL | Status: DC
  Administered 2021-12-18 – 2021-12-21 (×2): 220 mg via ORAL
  Filled 2021-12-18 (×3): qty 1

## 2021-12-18 MED ORDER — ADULT MULTIVITAMIN W/MINERALS CH
1.0000 | ORAL_TABLET | Freq: Every day | ORAL | Status: DC
  Administered 2021-12-18 – 2021-12-21 (×2): 1 via ORAL
  Filled 2021-12-18 (×3): qty 1

## 2021-12-18 MED ORDER — PROSOURCE PLUS PO LIQD
30.0000 mL | Freq: Two times a day (BID) | ORAL | Status: DC
  Administered 2021-12-18 – 2022-01-13 (×34): 30 mL via ORAL
  Filled 2021-12-18 (×36): qty 30

## 2021-12-18 MED ORDER — CHLORHEXIDINE GLUCONATE 4 % EX LIQD: 60.0000 mL | Freq: Once | CUTANEOUS | Status: DC

## 2021-12-18 MED ORDER — ENSURE ENLIVE PO LIQD
237.0000 mL | Freq: Two times a day (BID) | ORAL | Status: DC
  Administered 2021-12-18 – 2022-01-13 (×31): 237 mL via ORAL

## 2021-12-18 MED ORDER — ASCORBIC ACID 500 MG PO TABS
500.0000 mg | ORAL_TABLET | Freq: Two times a day (BID) | ORAL | Status: DC
  Administered 2021-12-18 – 2021-12-21 (×4): 500 mg via ORAL
  Filled 2021-12-18 (×5): qty 1

## 2021-12-18 MED ORDER — ENOXAPARIN SODIUM 100 MG/ML IJ SOSY: 100.0000 mg | PREFILLED_SYRINGE | Freq: Two times a day (BID) | INTRAMUSCULAR | Status: DC

## 2021-12-18 MED ORDER — POVIDONE-IODINE 10 % EX SWAB: 2.0000 "application " | Freq: Once | CUTANEOUS | Status: DC

## 2021-12-18 NOTE — Progress Notes (Addendum)
ANTICOAGULATION CONSULT NOTE - Initial Consult ? ?Pharmacy Consult for Enoxaparin ?Indication: atrial fibrillation ? ?Allergies  ?Allergen Reactions  ? Lisinopril Swelling and Cough  ?  Face/throat swelling  ? Tree Extract Swelling and Other (See Comments)  ?  Swelling to eyes  ? Augmentin [Amoxicillin-Pot Clavulanate] Other (See Comments)  ?  Headache, dizzy  ? Ciprofloxacin Hives  ? ? ?Patient Measurements: ?Height: 5\' 5"  (165.1 cm) ?Weight: 101.4 kg (223 lb 8.7 oz) ?IBW/kg (Calculated) : 57 ? ? ?Vital Signs: ?Temp: 98.1 ?F (36.7 ?C) (04/27 6237) ?Temp Source: Oral (04/27 6283) ?BP: 147/73 (04/27 0951) ?Pulse Rate: 108 (04/27 0951) ? ?Labs: ?Recent Labs  ?  12/17/21 ?1517 12/18/21 ?0323  ?HGB 12.5 12.2  ?HCT 39.1 39.2  ?PLT 191 163  ?CREATININE 1.86* 1.58*  ? ? ?Estimated Creatinine Clearance: 40.8 mL/min (A) (by C-G formula based on SCr of 1.58 mg/dL (H)). ? ? ?Medical History: ?Past Medical History:  ?Diagnosis Date  ? Anemia   ? Anxiety   ? HX PANIC ATTACKS  ? Arthritis   ? "starting to; in my hands" (07/09/2015)  ? Asthma   ? Atrial fibrillation ([place])   ? Atrial flutter, paroxysmal ([place])   ? Bloated abdomen   ? CFS (chronic fatigue syndrome)   ? Chewing difficulty   ? Chronic asthma with acute exacerbation   ? "I have chronic asthma all the time; sometimes exacerbations" (07/09/2015)  ? Chronic lower back pain   ? COPD (chronic obstructive pulmonary disease) ([place])   ? Cyst of right kidney   ? "3 of them; dx'd in ~ 01/2015"  ? Dyspnea   ? GERD (gastroesophageal reflux disease)   ? Heart murmur   ? History of blood transfusion   ? "related to my brain surgery I think"  ? History of pulmonary embolism 07/09/2015  ? History of radiation therapy 04/09/20-04/22/20  ? SBRT Left Lung, Dr. Gery Pray  ? History of radiation therapy   ? lumbar spine 10/08/2021-10/28/2021  Dr Gery Pray  ? HIV disease ([place])   ? Hyperlipidemia   ? Hypertension   ? Leg edema   ? Lipodystrophy   ? Mild CAD 2013  ? Multiple thyroid nodules    ? Osteopenia   ? Palpitations   ? Pneumonia 07/09/2015  ? Shingles   ? Vitamin B 12 deficiency   ? Vitamin D deficiency   ? ? ?Medications:  ?Medications Prior to Admission  ?Medication Sig Dispense Refill Last Dose  ? A&D OINT Apply 1 application. topically daily as needed (rash).   12/17/2021  ? albuterol (PROVENTIL) (2.5 MG/3ML) 0.083% nebulizer solution USE 1 VIAL VIA NEBULIZER EVERY 4 HOURS AS NEEDED FOR WHEEZING OR SHORTNESS OF BREATH (Patient taking differently: Take 2.5 mg by nebulization every 6 (six) hours as needed for shortness of breath.) 75 mL 2 12/16/2021  ? amoxicillin (AMOXIL) 500 MG tablet Take 1 tablet (500 mg total) by mouth 2 (two) times daily. 14 tablet 0 12/16/2021  ? Ascorbic Acid (VITAMIN C) 1000 MG tablet Take 1,000 mg by mouth every other day.    Past Week  ? b complex vitamins tablet Take 1 tablet by mouth daily.   Past Week  ? BIKTARVY 50-200-25 MG TABS tablet TAKE 1 TABLET BY MOUTH DAILY 30 tablet 5 12/15/2021  ? BIOTIN PO Take 1 tablet by mouth every other day.   Past Week  ? Calcium Citrate-Vitamin D (CALCIUM + D PO) Take 1 tablet by mouth every other day.  Past Week  ? CALCIUM PO Take 1 tablet by mouth daily.   Past Week  ? cyanocobalamin (,VITAMIN B-12,) 1000 MCG/ML injection ADMINISTER 1 ML(1000 MCG) IN THE MUSCLE EVERY 30 DAYS (Patient taking differently: Inject 1,000 mcg into the muscle every 30 (thirty) days.) 3 mL 1 10/28/2021  ? dexamethasone (DECADRON) 4 MG tablet Take 1 tablet (4 mg total) by mouth 2 (two) times daily with a meal. 30 tablet 0 12/16/2021  ? diltiazem (CARDIZEM CD) 240 MG 24 hr capsule TAKE 1 CAPSULE(240 MG) BY MOUTH DAILY (Patient taking differently: Take 240 mg by mouth daily.) 90 capsule 3 12/16/2021  ? famotidine (PEPCID) 40 MG tablet Take 1 tablet (40 mg total) by mouth daily. 30 tablet 11 Past Month  ? magnesium hydroxide (MILK OF MAGNESIA) 400 MG/5ML suspension Take 15 mLs by mouth daily as needed for mild constipation.   Past Week  ? Melatonin 5 MG TABS Take  5 mg by mouth at bedtime as needed (for sleep).   Past Week  ? Naphazoline HCl (CLEAR EYES OP) Place 1 drop into both eyes daily as needed (redness).   Past Week  ? neomycin-bacitracin-polymyxin (NEOSPORIN) ointment Apply 1 application. topically daily as needed for wound care.   Past Week  ? Olopatadine HCl (PATADAY OP) Place 1 drop into both eyes daily as needed (burning).   Past Week  ? Oxycodone HCl 10 MG TABS Take 10 mg by mouth every 6 (six) hours as needed (breakthrough pain).   12/17/2021  ? OXYGEN Inhale 1.5-2 L into the lungs See admin instructions. Uses at bedtime and through the day as needed   12/17/2021  ? Phenylephrine-Cocoa Butter (PREPARATION H RE) Place 1 application. rectally daily as needed (hemorrhoids).   unknown  ? Polyethyl Glycol-Propyl Glycol (SYSTANE OP) Place 1 drop into both eyes daily as needed (dry eyes).   Past Week  ? potassium chloride (KLOR-CON) 10 MEQ tablet TAKE 4 TABLETS BY MOUTH DAILY FOR 5 DAYS ONLY (Patient taking differently: Take 40 mEq by mouth daily.) 30 tablet 0 12/16/2021  ? spironolactone (ALDACTONE) 25 MG tablet Take 1 tablet (25 mg total) by mouth daily. 90 tablet 0 Past Week  ? XARELTO 20 MG TABS tablet TAKE 1 TABLET(20 MG) BY MOUTH DAILY (Patient taking differently: 20 mg daily.) 90 tablet 0 12/16/2021 at 1100  ? furosemide (LASIX) 20 MG tablet TAKE 2 TABLETS BY MOUTH TWICE A DAY FOR 5 DAYS THEN AS DIRECTED BY PHYSICIAN (Patient not taking: Reported on 12/17/2021) 60 tablet 0 Not Taking  ? Misc. Devices (PULSE OXIMETER FOR FINGER) MISC 1 Units by Does not apply route as needed. 1 each 0   ? morphine (MS CONTIN) 15 MG 12 hr tablet Take 1 tablet by mouth every 12 hours. (Patient not taking: Reported on 12/17/2021) 30 tablet 0 Not Taking  ? Mouthwashes (BIOTENE DRY MOUTH GENTLE) LIQD Use as directed 1 Dose in the mouth or throat 2 (two) times daily as needed (dry mouth). (Patient not taking: Reported on 12/17/2021)   Not Taking  ? ondansetron (ZOFRAN) 4 MG tablet Take 1  tablet (4 mg total) by mouth every 8 (eight) hours as needed for nausea or vomiting. (Patient not taking: Reported on 11/27/2021) 20 tablet 0 Not Taking  ? SYRINGE/NEEDLE, DISP, 1 ML (B-D SYRINGE/NEEDLE 1CC/25GX5/8) 25G X 5/8" 1 ML MISC 1 Units by Does not apply route every 30 (thirty) days. 50 each 0   ? ? ?Assessment: ?Ms. Caudill is a 32 YOF PMH significant  of metastatic recurrent non-small cell lung cancer (2021, 2023),COPD, HTN, HLD, HIV, and afib who presented to the ED with a right lower extremity wound- found to be right foot gas gangrene. 4/26 she underwent I/D of a deep abscess on her right foot/leg. PTA she was on Xarelto 20 mg daily, hospitalist has requested her to be started on enoxaparin until cleared to restart Xarelto.  (CrCl 40.8, Hgb 12.2, Plt 163) ?  ?Plan:  ?Lovenox  100mg  Q12g (1mg /kg) ?Monitor s/sx of bleeding (hgb, platelets) ?Follow up Xarelto restart ? ?Asher Muir, PharmD Candidate ?12/18/2021,10:27 AM ? ? ?

## 2021-12-18 NOTE — Progress Notes (Signed)
Initial Nutrition Assessment ? ?DOCUMENTATION CODES:  ? ?Obesity unspecified ? ?INTERVENTION:  ? ?-Ensure Plus High Protein po BID, each supplement provides 350 kcal and 20 grams of protein.  ? ?-Prosource Plus PO BID, each provides 100 kcals and 15g protein ? ?-Multivitamin with minerals daily ? ?-500 mg Vitamin C BID ?-220 mg Zinc sulfate daily ? ?-Checking Vitamin D levels given extent of wounds and history of deficiency ? ?NUTRITION DIAGNOSIS:  ? ?Increased nutrient needs related to wound healing as evidenced by estimated needs. ? ?GOAL:  ? ?Patient will meet greater than or equal to 90% of their needs ? ?MONITOR:  ? ?PO intake, Supplement acceptance, Labs, Weight trends, I & O's, Skin ? ?REASON FOR ASSESSMENT:  ? ?Consult ?Assessment of nutrition requirement/status ? ?ASSESSMENT:  ? ?66 y.o. female with medical history significant of metastatic recurrent non-small cell lung cancer (01/2020) s/p curative radiotherapy (03/2020), with recurrence in February 2023, asthma, anxiety, COPD, GERD, hypertension, hyperlipidemia and HIV was brought in to the emergency department from [facility] due to worsening of the right lower extremity wound. Admitted for Abscess and necrotizing fasciitis of the right lower extremity and mild cellulitis of left lower extremity. ? ?4/26: s/p I&D of left foot and leg abscesses ? ?Patient in room, deeply asleep. Eyes would open but then shut again. Pt would not waken during RD visit. Will attempt to gather history at a later time. ?  ?Per chart review, pt with necrotizing fasciitis of LLE. RD has ordered Ensure supplements for additional kcals and protein. As well as Prosource Plus, Vitamin C and Zinc to aid in wound healing.  ?Checking Vitamin D levels given previous deficiency and wound. ?Will need to discuss other vitamin labs with patient given at risk of multiple vitamin/mineral deficiencies. Would check zinc and Vitamin A additionally. ? ?Per home medications list in chart, pt  takes the following: Vitamin C, B complex with C, Biotin, Calcium w/ Vit D, gets monthly Vit B-12 shots. ? ?No PO documented at this time. Lunch meal was ordered. ? ?Per weight records, pt has lost 11 lbs since 3/16 (4% wt loss x 1.5 months, insignificant for time frame).  ? ?Medications: Colace, Pepcid ? ?Labs reviewed: ? CBGs: 83-124 ? ?NUTRITION - FOCUSED PHYSICAL EXAM: ? ?Flowsheet Row Most Recent Value  ?Orbital Region No depletion  ?Upper Arm Region No depletion  ?Thoracic and Lumbar Region No depletion  ?Buccal Region No depletion  ?Temple Region Moderate depletion  ?Clavicle Bone Region No depletion  ?Clavicle and Acromion Bone Region No depletion  ?Scapular Bone Region No depletion  ?Dorsal Hand No depletion  ?Patellar Region Unable to assess  ?Anterior Thigh Region Unable to assess  ?Posterior Calf Region Unable to assess  ?Edema (RD Assessment) None  ?Hair Reviewed  ?Eyes Unable to assess  ?Mouth Unable to assess  ?Skin Reviewed  ? ?  ? ? ?Diet Order:   ?Diet Order   ? ?       ?  Diet regular Room service appropriate? Yes; Fluid consistency: Thin  Diet effective now       ?  ? ?  ?  ? ?  ? ? ?EDUCATION NEEDS:  ? ?Not appropriate for education at this time ? ?Skin:  Skin Assessment: Skin Integrity Issues: ?Skin Integrity Issues:: Incisions ?Incisions: 4/26: right pretibial, right leg ? ?Last BM:  4/26 ? ?Height:  ? ?Ht Readings from Last 1 Encounters:  ?12/17/21 5\' 5"  (1.651 m)  ? ? ?Weight:  ? ?Wt  Readings from Last 1 Encounters:  ?12/18/21 101.4 kg  ? ? ?BMI:  Body mass index is 37.2 kg/m?. ? ?Estimated Nutritional Needs:  ? ?Kcal:  1950-2150 ? ?Protein:  100-115g ? ?Fluid:  2L/day ? ?Clayton Bibles, MS, RD, LDN ?Inpatient Clinical Dietitian ?Contact information available via Amion ? ?

## 2021-12-18 NOTE — Progress Notes (Signed)
?PROGRESS NOTE ? ? ? ?Shannon [name]  EHU:[id number] DOB: [birth year] DOA: 12/17/2021 ?PCP: [name], MD ? ? ?Brief Narrative:  ?HPI: [name] is a 66 y.o. female with medical history significant of metastatic recurrent non-small cell lung cancer (01/2020) s/p curative radiotherapy (03/2020), with recurrence in February 2023, asthma, anxiety, COPD, GERD, hypertension, hyperlipidemia and HIV was brought in to the emergency department from [facility] due to worsening of the right lower extremity wound.  Patient is a poor historian.  According to patient, she started having small blister on the dorsum of the right foot about 3 to 4 weeks ago.  For this she started medical attention in the ED on 12/02/2021.  According to her, she has been prescribed multiple antibiotics.  Currently patient is extremely emotional and cannot remember the names of her the dates of antibiotics and whether she ever completed any of those courses.  Nonetheless, she was noted to have worsening of the right lower extremity wound so she was sent to the ED today.  Patient denies any other complaint other than right lower extremity pain.  No fever, chills, sweating or any other complaint. ?  ?ED Course: Upon arrival to ED, she was slightly tachycardic with slightly elevated blood pressure but she was afebrile and no tachypnea.  White cells were only 11.5.  Her CKD stage IIIa was at baseline.  She was diagnosed with right foot abscess and possible necrotizing fasciitis based on the CT of the lower extremity.  ED physician had consulted with orthopedics who recommended admission under hospitalist service and they we will proceed with surgical intervention tonight. ? ?Assessment & Plan: ?  ?Principal Problem: ?  Abscess of foot ?Active Problems: ?  HIV disease ([place]) ?  Uncontrolled hypertension ?  Morbid (severe) obesity due to excess calories ([place]) ?  Atrial flutter ([place]) ?  Chronic anticoagulation ?  (HFpEF) heart failure with preserved  ejection fraction ([place]) ?  Adenocarcinoma of lung ([place]) ?  Abscess of right foot ?  Necrotizing fasciitis ([place]) ? ?Abscess and necrotizing fasciitis of the right lower extremity and mild cellulitis of left lower extremity: S/p right foot incision and drainage of the deep abscess by orthopedics on 12/17/2021.  Pain is better controlled.  Continue current broad-spectrum antibiotics, wait for cultures and continue current pain management.  Appreciate and defer further management to orthopedics.  Currently on cefepime, linezolid and Flagyl. ?  ?HIV disease: Continue home medications/Biktarvy. ?  ?Permanent atrial flutter/fibrillation: Rate is controlled.  Continue diltiazem.  We will hold Xarelto.  Waiting for orthopedics to clarify plans and cleared her for oral anticoagulation.  In the meantime, will start on Lovenox. ?  ?Hyperkalemia: Resolved. ?  ?Type 2 diabetes mellitus: Hemoglobin A1c 7.3.  Blood sugar controlled on SSI. ?  ?AKI on CKD stage IIIa: Patient's baseline creatinine appears to be around 1.1 with GFR between 55 and 60 but currently creatinine 1.86 with GFR 29.  Likely secondary to infection.  Creatinine improving.  Continue IV fluids.  Repeat labs in the morning. ? ?DVT prophylaxis:  ?  Code Status: Full Code  ?Family Communication:  None present at bedside.  Plan of care discussed with patient in length and he/she verbalized understanding and agreed with it. ? ?Status is: Inpatient ?Remains inpatient appropriate because: Needs further plan clarification by orthopedics. ? ? ?Estimated body mass index is 37.2 kg/m? as calculated from the following: ?  Height as of this encounter: 5\' 5"  (1.651 m). ?  Weight as of this encounter: 101.4 kg. ? ?  ?Nutritional Assessment: ?Body mass index is 37.2 kg/m?Marland KitchenMarland Kitchen ?Seen by dietician.  I agree with the assessment and plan as outlined below: ?Nutrition Status: ?  ?  ?  ? ?. ?Skin Assessment: ?I have examined the patient's skin and I agree with the wound assessment as  performed by the wound care RN as outlined below: ?  ? ?Consultants:  ?Orthopedics ? ?Procedures:  ?As above ? ?Antimicrobials:  ?Anti-infectives (From admission, onward)  ? ? Start     Dose/Rate Route Frequency Ordered Stop  ? 12/18/21 0500  metroNIDAZOLE (FLAGYL) IVPB 500 mg       ? 500 mg ?100 mL/hr over 60 Minutes Intravenous Every 12 hours 12/17/21 1851    ? 12/18/21 0400  ceFEPIme (MAXIPIME) 2 g in sodium chloride 0.9 % 100 mL IVPB       ? 2 g ?200 mL/hr over 30 Minutes Intravenous Every 12 hours 12/17/21 1851    ? 12/18/21 0100  linezolid (ZYVOX) IVPB 600 mg       ? 600 mg ?300 mL/hr over 60 Minutes Intravenous Every 12 hours 12/17/21 1851    ? 12/17/21 2255  vancomycin (VANCOCIN) powder  Status:  Discontinued       ?   As needed 12/17/21 2324 12/18/21 0132  ? 12/17/21 1745  bictegravir-emtricitabine-tenofovir AF (BIKTARVY) 50-200-25 MG per tablet 1 tablet  Status:  Discontinued       ? 1 tablet Oral Daily 12/17/21 1737 12/17/21 1802  ? 12/17/21 1745  vancomycin (VANCOCIN) IVPB 1000 mg/200 mL premix  Status:  Discontinued       ? 1,000 mg ?200 mL/hr over 60 Minutes Intravenous  Once 12/17/21 1737 12/17/21 1742  ? 12/17/21 1745  meropenem (MERREM) 1 g in sodium chloride 0.9 % 100 mL IVPB  Status:  Discontinued       ? 1 g ?200 mL/hr over 30 Minutes Intravenous  Once 12/17/21 1737 12/17/21 1852  ? 12/17/21 1745  clindamycin (CLEOCIN) capsule 600 mg  Status:  Discontinued       ? 600 mg Oral Every 8 hours 12/17/21 1737 12/17/21 1851  ? 12/17/21 1530  ceFEPIme (MAXIPIME) 2 g in sodium chloride 0.9 % 100 mL IVPB  Status:  Discontinued       ? 2 g ?200 mL/hr over 30 Minutes Intravenous 2 times daily 12/17/21 1526 12/17/21 1800  ? 12/17/21 1530  metroNIDAZOLE (FLAGYL) IVPB 500 mg       ? 500 mg ?100 mL/hr over 60 Minutes Intravenous Every 12 hours 12/17/21 1526 12/17/21 1830  ? 12/17/21 1515  clindamycin (CLEOCIN) capsule 300 mg  Status:  Discontinued       ? 300 mg Oral  Once 12/17/21 1505 12/17/21 1526  ? 12/17/21  1445  bictegravir-emtricitabine-tenofovir AF (BIKTARVY) 50-200-25 MG per tablet 1 tablet       ? 1 tablet Oral Daily 12/17/21 1438    ? 12/17/21 1245  linezolid (ZYVOX) IVPB 600 mg  Status:  Discontinued       ? 600 mg ?300 mL/hr over 60 Minutes Intravenous Every 12 hours 12/17/21 1233 12/17/21 1802  ? 12/17/21 1130  vancomycin (VANCOCIN) IVPB 1000 mg/200 mL premix  Status:  Discontinued       ? 1,000 mg ?200 mL/hr over 60 Minutes Intravenous  Once 12/17/21 1121 12/17/21 1127  ? 12/17/21 1130  cefTRIAXone (ROCEPHIN) 2 g in sodium chloride 0.9 % 100 mL IVPB       ?  2 g ?200 mL/hr over 30 Minutes Intravenous  Once 12/17/21 1121 12/17/21 1313  ? 12/17/21 1130  vancomycin (VANCOREADY) IVPB 1500 mg/300 mL  Status:  Discontinued       ? 1,500 mg ?150 mL/hr over 120 Minutes Intravenous  Once 12/17/21 1127 12/17/21 1243  ? ?  ?  ? ? ?Subjective: ?Seen and examined.  Feels slightly better but is still very anxious. ? ?Objective: ?Vitals:  ? 12/18/21 0045 12/18/21 0103 12/18/21 0981 12/18/21 0459  ?BP: (!) 178/116 (!) 155/103 126/82   ?Pulse: 100 (!) 106 (!) 102   ?Resp: (!) 25 18    ?Temp:  97.6 ?F (36.4 ?C) 98.1 ?F (36.7 ?C)   ?TempSrc:  Oral Oral   ?SpO2: 100% 98% 98%   ?Weight:    101.4 kg  ?Height:      ? ? ?Intake/Output Summary (Last 24 hours) at 12/18/2021 0946 ?Last data filed at 12/18/2021 1914 ?Gross per 24 hour  ?Intake 2973.48 ml  ?Output 50 ml  ?Net 2923.48 ml  ? ?Filed Weights  ? 12/17/21 1021 12/17/21 2000 12/18/21 0459  ?Weight: 102.1 kg 101.4 kg 101.4 kg  ? ? ?Examination: ? ?General exam: Appears calm and comfortable, obese ?Respiratory system: Clear to auscultation. Respiratory effort normal. ?Cardiovascular system: S1 & S2 heard, RRR. No JVD, murmurs, rubs, gallops or clicks. No pedal edema. ?Gastrointestinal system: Abdomen is nondistended, soft and nontender. No organomegaly or masses felt. Normal bowel sounds heard. ?Central nervous system: Alert and oriented. No focal neurological  deficits. ?Extremities: Dressing in the right lower extremity.  Slight erythema in the left lower extremity which is tender to palpation as well. ? ? ?Data Reviewed: I have personally reviewed following labs and imaging studie

## 2021-12-18 NOTE — Anesthesia Postprocedure Evaluation (Signed)
Anesthesia Post Note ? ?Patient: Shannon [name] ? ?Procedure(s) Performed: IRRIGATION AND DEBRIDEMENT, RIGHT FOOT AND CALF (Right: Foot) ? ?  ? ?Patient location during evaluation: PACU ?Anesthesia Type: General ?Level of consciousness: awake and alert ?Pain management: pain level controlled ?Vital Signs Assessment: post-procedure vital signs reviewed and stable ?Respiratory status: spontaneous breathing, nonlabored ventilation and respiratory function stable ?Cardiovascular status: blood pressure returned to baseline ?Postop Assessment: no apparent nausea or vomiting ?Anesthetic complications: no ? ? ?No notable events documented. ? ?Last Vitals:  ?Vitals:  ? 12/18/21 0045 12/18/21 0103  ?BP: (!) 178/116 (!) 155/103  ?Pulse: 100 (!) 106  ?Resp: (!) 25 18  ?Temp:  36.4 ?C  ?SpO2: 100% 98%  ?  ?Last Pain:  ?Vitals:  ? 12/18/21 0103  ?TempSrc: Oral  ?PainSc:   ? ? ?  ?  ?  ?  ?  ?  ? ?Marthenia Rolling ? ? ? ? ?

## 2021-12-18 NOTE — Progress Notes (Signed)
Orthopedic Tech Progress Note ?Patient Details:  ?Shannon [name] ?02-Jan-1956 ?[id number] ? ?Patient ID: Shannon [name], female   DOB: 1956-01-08, 66 y.o.   MRN: [id number] ? ?Shannon [name] ?12/18/2021, 2:44 PM ?Cam walker not applied to patients leg due to pain and large dressings. RN advised. If patient returns to OR tomorrow we can apply a boot or shoe as needed . ?

## 2021-12-18 NOTE — Care Plan (Addendum)
Plan of Care ? ?-spoke with patient's sister Ms. Francis Dowse at the patient's request ?-Ms. Flowers lives out of state in [place] but is very involved with facilitating the patient's medical care as the patient lives alone in [place] ?-we reviewed the ongoing events and management plan for the patient's critical illness, especially the severity of her ongoing infection and potential for sudden decompensation ?-per the patient's sister, the patient had onset of atraumatic right foot pain & swelling at least as early as 11/21/21 when she complained about it to her ?-Ms. Flowers understands and would like the care team, including hospitalist team as well as case management to reach her at any time by cell phone [phone]) ? ?Armond Hang, MD ?Orthopaedic Surgery ?[facility] ? ?

## 2021-12-18 NOTE — Progress Notes (Addendum)
Subjective: ?1 Day Post-Op Procedure(s) (LRB): ?IRRIGATION AND DEBRIDEMENT, RIGHT FOOT AND CALF (Right) ? ?Patient stable on RNF postop. She is drowsy but follows commands and localizes pain. ? ?Objective:  ? ?VITALS:  Temp:  [97.6 ?F (36.4 ?C)-98.2 ?F (36.8 ?C)] 98 ?F (36.7 ?C) (04/27 1406) ?Pulse Rate:  [100-110] 100 (04/27 1406) ?Resp:  [13-26] 16 (04/27 1406) ?BP: (121-190)/(73-116) 121/98 (04/27 1406) ?SpO2:  [90 %-100 %] 99 % (04/27 1406) ?Weight:  [101.4 kg] 101.4 kg (04/27 0459) ? ?Gen: AAOx3, NAD ?Comfortable at rest ? ?Right Lower Extremity: ?Improving swelling and erythema of leg and foot, no progression to thigh ?No new blistering ?No crepitus ?TTP over surgical sites ?Packing in place at surgical sites with appropriate serosanguineous drainage ?HF/KE/KF/ADF/APF/EHL 5/5 ?SILT throughout ?DP, PT 2+ to palp ?CR < 2s ? ?Left Lower Extremity: ?Improving swelling and erythema of leg, no progression to foot or thigh ?No blistering ?No crepitus ?TTP over medial ankle (stable from yesterday) ?HF/KE/KF/ADF/APF/EHL 5/5 ?SILT throughout ?DP, PT 2+ to palp ?CR < 2s ? ? ? ?LABS ?Recent Labs  ?  12/17/21 ?7494 12/18/21 ?0323  ?HGB 12.5 12.2  ?WBC 11.5* 10.4  ?PLT 191 163  ? ?Recent Labs  ?  12/17/21 ?4967 12/18/21 ?0323  ?NA 139 142  ?K 5.3* 5.0  ?CL 99 104  ?CO2 34* 32  ?BUN 46* 42*  ?CREATININE 1.86* 1.58*  ?GLUCOSE 241* 124*  ? ?No results for input(s): LABPT, INR in the last 72 hours. ? ? ?Assessment/Plan: ?1 Day Post-Op Procedure(s) (LRB): ?IRRIGATION AND DEBRIDEMENT, RIGHT FOOT AND CALF (Right) ? ?-pt remains critical but stable on RNF, her vitals have remained stable and she is clinically looking improved in BLE. She is now sundowning after anesthesia overnight but is reorientable and follows commands ?-plan for wound care team to place vac dressing to right leg wound. M/W/F vac changes at bedside. No plan for return to OR at this time given improving clinical state as well as patient's high risk for anesthesia  given her pulmonary status ?-bedside nursing to do daily packing of right foot wound with sterile iodoform gauze ?-heel weightbearing right lower extremity in CAM boot when ambulating, maximum elevation ?-VTE prophylaxis per hospitalist team preference, patient has a history of PE and metastatic carcinoma. OK from Ortho standpoint for Lovenox daily or as per primary team discretion given her CKD ?-trend intraoperative cultures ?-broad spectrum IV antibiotics including coverage for gas gangrene/necrotizing infection ?-nutrition consult ?-appreciate primary team care, ID recs, wound care team help ?-will continue to follow patient during admission, possible return to OR for repeat debridement if clinical picture worsening ?  ? ?Armond Hang ?12/18/2021, 4:52 PM ? ?

## 2021-12-18 NOTE — TOC Initial Note (Signed)
Transition of Care (TOC) - Initial/Assessment Note  ? ? ?Patient Details  ?Name: Shannon [name] ?MRN: [id number] ?Date of Birth: 18-Jul-1956 ? ?Transition of Care [facility]) CM/SW Contact:    ?Leeroy Cha, RN ?Phone Number: ?12/18/2021, 9:09 AM ? ?Clinical Narrative:                 ? ?Transition of Care (TOC) Screening Note ? ? ?Patient Details  ?Name: Shannon [name] ?Date of Birth: [birth year] ? ? ?Transition of Care [facility]) CM/SW Contact:    ?Leeroy Cha, RN ?Phone Number: ?12/18/2021, 9:09 AM ? ? ? ?Transition of Care Department [facility]) has reviewed patient and no TOC needs have been identified at this time. We will continue to monitor patient advancement through interdisciplinary progression rounds. If new patient transition needs arise, please place a TOC consult. ? ? ? ?Expected Discharge Plan: Home/Self Care ?Barriers to Discharge: No Barriers Identified ? ? ?Patient Goals and CMS Choice ?Patient states their goals for this hospitalization and ongoing recovery are:: to go home ?CMS Medicare.gov Compare Post Acute Care list provided to:: Patient ?Choice offered to / list presented to : Patient ? ?Expected Discharge Plan and Services ?Expected Discharge Plan: Home/Self Care ?  ?Discharge Planning Services: CM Consult ?  ?Living arrangements for the past 2 months: Apartment ?                ?  ?  ?  ?  ?  ?  ?  ?  ?  ?  ? ?Prior Living Arrangements/Services ?Living arrangements for the past 2 months: Apartment ?Lives with:: Self ?Patient language and need for interpreter reviewed:: Yes ?Do you feel safe going back to the place where you live?: Yes      ?  ?  ?  ?Criminal Activity/Legal Involvement Pertinent to Current Situation/Hospitalization: No - Comment as needed ? ?Activities of Daily Living ?Home Assistive Devices/Equipment: Gilford Rile (specify type), Eyeglasses, Cane (specify quad or straight) ?ADL Screening (condition at time of admission) ?Patient's cognitive ability adequate to safely complete daily  activities?: Yes ?Is the patient deaf or have difficulty hearing?: No ?Does the patient have difficulty seeing, even when wearing glasses/contacts?: Yes ?Does the patient have difficulty concentrating, remembering, or making decisions?: Yes ?Patient able to express need for assistance with ADLs?: Yes ?Does the patient have difficulty dressing or bathing?: Yes ?Independently performs ADLs?: No ?Communication: Independent ?Dressing (OT): Needs assistance ?Is this a change from baseline?: Pre-admission baseline ?Grooming: Independent ?Feeding: Independent ?Bathing: Needs assistance ?Is this a change from baseline?: Pre-admission baseline ?Toileting: Needs assistance ?Is this a change from baseline?: Pre-admission baseline ?In/Out Bed: Needs assistance ?Is this a change from baseline?: Pre-admission baseline ?Walks in Home: Needs assistance ?Is this a change from baseline?: Pre-admission baseline ?Does the patient have difficulty walking or climbing stairs?: Yes ?Weakness of Legs: Both ?Weakness of Arms/Hands: Both ? ?Permission Sought/Granted ?  ?  ?   ?   ?   ?   ? ?Emotional Assessment ?Appearance:: Appears stated age ?Attitude/Demeanor/Rapport: Engaged ?Affect (typically observed): Calm ?Orientation: : Oriented to Self, Oriented to Place, Oriented to  Time, Oriented to Situation ?Alcohol / Substance Use: Not Applicable ?Psych Involvement: No (comment) ? ?Admission diagnosis:  Abscess of foot [L02.619] ?Abscess of right foot [L02.611] ?Gas gangrene of foot ([place]) [A48.0] ?Patient Active Problem List  ? Diagnosis Date Noted  ? Abscess of foot 12/17/2021  ? Abscess of right foot 12/17/2021  ? Necrotizing fasciitis ([place]) 12/17/2021  ? Malignant  neoplasm metastatic to bone ([place]) 10/06/2021  ? Chest wall pain 08/19/2021  ? Acute right-sided thoracic back pain 08/12/2021  ? Intercostal muscle strain 04/14/2021  ? Rib fracture 03/12/2021  ? Osteoporosis 03/12/2021  ? Panniculitis 01/23/2021  ? Healthcare maintenance  01/23/2021  ? On supplemental oxygen therapy 11/19/2020  ? Insomnia 11/14/2020  ? Burn erythema of breast, initial encounter 11/05/2020  ? Skin ulcer ([place]) 06/24/2020  ? Pulmonary nodule 02/14/2020  ? Nodule of upper lobe of left lung 02/06/2020  ? Thyroid nodule 01/26/2020  ? Adenocarcinoma of lung ([place]) 01/04/2020  ? Chronic obstructive asthma ([place]) 12/29/2019  ? (HFpEF) heart failure with preserved ejection fraction ([place]) 02/07/2019  ? Acute on chronic heart failure with preserved ejection fraction (HFpEF) ([place]) 02/04/2019  ? Vitamin D deficiency 06/30/2018  ? Chronic anticoagulation 01/03/2018  ? Chronic respiratory failure with hypoxia ([place]) 11/30/2017  ? Atrial flutter ([place]) 03/11/2017  ? CAD (coronary artery disease) 01/28/2017  ? Obesity (BMI 30-39.9) 01/13/2017  ? Ascending aorta dilatation ([facility]) 01/07/2017  ? Low back pain radiating to right lower extremity 12/02/2016  ? Acquired cyst of kidney 12/02/2016  ? Chronic kidney disease (CKD), stage III (moderate) ([place]) 12/01/2016  ? Generalized anxiety disorder 10/22/2016  ? Hypersomnia 10/01/2016  ? Accessory skin tags 06/10/2016  ? Nocturnal hypoxemia 05/13/2016  ? Cervical radiculopathy 04/14/2016  ? GERD (gastroesophageal reflux disease) 01/02/2016  ? Morbid (severe) obesity due to excess calories ([place]) 10/10/2015  ? Vitamin B12 deficiency 10/02/2015  ? HIV disease ([place]) 07/09/2015  ? Uncontrolled hypertension 07/09/2015  ? ?PCP:  [name], MD ?Pharmacy:   ?[id number] - , [address] ?[place] ?[place] [address] ?Phone: [phone] Fax: [phone] ? ?[facility] ?[address] ?[place] [address] ?Phone: [phone] Fax: [phone] ? ? ? ? ?Social Determinants of Health (SDOH) Interventions ?  ? ?Readmission Risk Interventions ?   ? View : No data to display.  ?  ?  ?  ? ? ? ?

## 2021-12-18 NOTE — Consult Note (Addendum)
[facility] Nurse Consult Note: ?Reason for Consult: Daily wound care ?Wound type: Surgical, infectious ?Pressure Injury POA: N/A ? ?I have discussed the patient's care with her orthopedic surgeon, Dr. Kathaleen Bury. First surgical dressing change is to be performed by the surgeon, their associate, or designee (PA). Additionally, patient may be going back to OR tomorrow for another debridement at the Surgeon's discretion. ?Dr. Kathaleen Bury is considering use of NPWT. If deemed appropriate by that provider, he may request assistance with placement of NPWT device tomorrow, Friday 4/28. ? ?NPWT dressing (x2), cannister, and device is requested to patient's room by this Probation officer. ? ?If daily wound care is preferred over NPWT, bedside RN may perform wound care with orders provided by the provider. Dr. Kathaleen Bury will provide orders. ? ?Referral to outpatient wound care center will need to be Provider to Provider. ? ?Thank you.  Will stand by for degree of future involvement of the [facility] team. ? ?Maudie Flakes, MSN, RN, [place], [place], CWON-AP, [place]  ?Pager# [phone]  ? ? ?  ?

## 2021-12-19 ENCOUNTER — Inpatient Hospital Stay (HOSPITAL_COMMUNITY): Payer: Medicare Other

## 2021-12-19 ENCOUNTER — Inpatient Hospital Stay: Payer: Self-pay

## 2021-12-19 DIAGNOSIS — R609 Edema, unspecified: Secondary | ICD-10-CM

## 2021-12-19 DIAGNOSIS — L02611 Cutaneous abscess of right foot: Secondary | ICD-10-CM

## 2021-12-19 DIAGNOSIS — L02619 Cutaneous abscess of unspecified foot: Secondary | ICD-10-CM | POA: Diagnosis not present

## 2021-12-19 LAB — CBC WITH DIFFERENTIAL/PLATELET
Abs Immature Granulocytes: 0.08 10*3/uL — ABNORMAL HIGH (ref 0.00–0.07)
Basophils Absolute: 0 10*3/uL (ref 0.0–0.1)
Basophils Relative: 0 %
Eosinophils Absolute: 0 10*3/uL (ref 0.0–0.5)
Eosinophils Relative: 1 %
HCT: 35.2 % — ABNORMAL LOW (ref 36.0–46.0)
Hemoglobin: 10.8 g/dL — ABNORMAL LOW (ref 12.0–15.0)
Immature Granulocytes: 1 %
Lymphocytes Relative: 4 %
Lymphs Abs: 0.4 10*3/uL — ABNORMAL LOW (ref 0.7–4.0)
MCH: 33 pg (ref 26.0–34.0)
MCHC: 30.7 g/dL (ref 30.0–36.0)
MCV: 107.6 fL — ABNORMAL HIGH (ref 80.0–100.0)
Monocytes Absolute: 0.6 10*3/uL (ref 0.1–1.0)
Monocytes Relative: 7 %
Neutro Abs: 7.4 10*3/uL (ref 1.7–7.7)
Neutrophils Relative %: 87 %
Platelets: 146 10*3/uL — ABNORMAL LOW (ref 150–400)
RBC: 3.27 MIL/uL — ABNORMAL LOW (ref 3.87–5.11)
RDW: 13.5 % (ref 11.5–15.5)
WBC: 8.4 10*3/uL (ref 4.0–10.5)
nRBC: 0.2 % (ref 0.0–0.2)

## 2021-12-19 LAB — BLOOD CULTURE ID PANEL (REFLEXED) - BCID2
A.calcoaceticus-baumannii: NOT DETECTED
Bacteroides fragilis: NOT DETECTED
Candida albicans: NOT DETECTED
Candida auris: NOT DETECTED
Candida glabrata: NOT DETECTED
Candida krusei: NOT DETECTED
Candida parapsilosis: DETECTED — AB
Candida tropicalis: NOT DETECTED
Cryptococcus neoformans/gattii: NOT DETECTED
Enterobacter cloacae complex: NOT DETECTED
Enterobacterales: NOT DETECTED
Enterococcus Faecium: NOT DETECTED
Enterococcus faecalis: NOT DETECTED
Escherichia coli: NOT DETECTED
Haemophilus influenzae: NOT DETECTED
Klebsiella aerogenes: NOT DETECTED
Klebsiella oxytoca: NOT DETECTED
Klebsiella pneumoniae: NOT DETECTED
Listeria monocytogenes: NOT DETECTED
Neisseria meningitidis: NOT DETECTED
Proteus species: NOT DETECTED
Pseudomonas aeruginosa: NOT DETECTED
Salmonella species: NOT DETECTED
Serratia marcescens: NOT DETECTED
Staphylococcus aureus (BCID): NOT DETECTED
Staphylococcus epidermidis: NOT DETECTED
Staphylococcus lugdunensis: NOT DETECTED
Staphylococcus species: NOT DETECTED
Stenotrophomonas maltophilia: NOT DETECTED
Streptococcus agalactiae: NOT DETECTED
Streptococcus pneumoniae: NOT DETECTED
Streptococcus pyogenes: NOT DETECTED
Streptococcus species: NOT DETECTED

## 2021-12-19 LAB — BLOOD GAS, ARTERIAL
Acid-Base Excess: 2.8 mmol/L — ABNORMAL HIGH (ref 0.0–2.0)
Bicarbonate: 31.5 mmol/L — ABNORMAL HIGH (ref 20.0–28.0)
O2 Saturation: 93.7 %
Patient temperature: 37.2
pCO2 arterial: 68 mmHg (ref 32–48)
pH, Arterial: 7.28 — ABNORMAL LOW (ref 7.35–7.45)
pO2, Arterial: 58 mmHg — ABNORMAL LOW (ref 83–108)

## 2021-12-19 LAB — BASIC METABOLIC PANEL
Anion gap: 5 (ref 5–15)
BUN: 34 mg/dL — ABNORMAL HIGH (ref 8–23)
CO2: 29 mmol/L (ref 22–32)
Calcium: 9.6 mg/dL (ref 8.9–10.3)
Chloride: 106 mmol/L (ref 98–111)
Creatinine, Ser: 1.6 mg/dL — ABNORMAL HIGH (ref 0.44–1.00)
GFR, Estimated: 35 mL/min — ABNORMAL LOW (ref >60)
Glucose, Bld: 123 mg/dL — ABNORMAL HIGH (ref 70–99)
Potassium: 4.8 mmol/L (ref 3.5–5.1)
Sodium: 140 mmol/L (ref 135–145)

## 2021-12-19 LAB — VITAMIN D 25 HYDROXY (VIT D DEFICIENCY, FRACTURES): Vit D, 25-Hydroxy: 43.94 ng/mL (ref 30–100)

## 2021-12-19 LAB — GLUCOSE, CAPILLARY
Glucose-Capillary: 103 mg/dL — ABNORMAL HIGH (ref 70–99)
Glucose-Capillary: 108 mg/dL — ABNORMAL HIGH (ref 70–99)
Glucose-Capillary: 116 mg/dL — ABNORMAL HIGH (ref 70–99)
Glucose-Capillary: 73 mg/dL (ref 70–99)
Glucose-Capillary: 90 mg/dL (ref 70–99)
Glucose-Capillary: 90 mg/dL (ref 70–99)
Glucose-Capillary: 97 mg/dL (ref 70–99)

## 2021-12-19 MED ORDER — PIPERACILLIN-TAZOBACTAM 3.375 G IVPB
3.3750 g | Freq: Three times a day (TID) | INTRAVENOUS | Status: DC
  Administered 2021-12-19 – 2021-12-22 (×9): 3.375 g via INTRAVENOUS
  Filled 2021-12-19 (×9): qty 50

## 2021-12-19 MED ORDER — QUETIAPINE FUMARATE 25 MG PO TABS: 25.0000 mg | ORAL_TABLET | Freq: Every day | ORAL | Status: DC

## 2021-12-19 MED ORDER — FLUCONAZOLE IN SODIUM CHLORIDE 400-0.9 MG/200ML-% IV SOLN
800.0000 mg | Freq: Once | INTRAVENOUS | Status: DC
  Filled 2021-12-19: qty 400

## 2021-12-19 MED ORDER — QUETIAPINE FUMARATE 50 MG PO TABS
25.0000 mg | ORAL_TABLET | Freq: Every day | ORAL | Status: DC
  Administered 2021-12-19 – 2021-12-20 (×2): 25 mg via ORAL
  Filled 2021-12-19 (×2): qty 1

## 2021-12-19 MED ORDER — SODIUM CHLORIDE 0.9% FLUSH
10.0000 mL | Freq: Two times a day (BID) | INTRAVENOUS | Status: DC
  Administered 2021-12-19 – 2021-12-23 (×8): 10 mL
  Administered 2021-12-23: 20 mL
  Administered 2021-12-24 – 2021-12-25 (×3): 10 mL
  Administered 2021-12-25 – 2021-12-26 (×2): 20 mL
  Administered 2021-12-26 – 2021-12-29 (×7): 10 mL
  Administered 2021-12-30: 30 mL
  Administered 2021-12-30: 20 mL
  Administered 2021-12-31: 10 mL
  Administered 2021-12-31: 20 mL
  Administered 2022-01-01 – 2022-01-03 (×4): 10 mL
  Administered 2022-01-03: 20 mL
  Administered 2022-01-04 – 2022-01-06 (×4): 10 mL
  Administered 2022-01-06: 20 mL
  Administered 2022-01-06: 10 mL
  Administered 2022-01-07: 20 mL
  Administered 2022-01-07: 10 mL
  Administered 2022-01-08: 20 mL
  Administered 2022-01-08 – 2022-01-14 (×12): 10 mL

## 2021-12-19 MED ORDER — SODIUM CHLORIDE 0.9 % IV SOLN: INTRAVENOUS | Status: DC

## 2021-12-19 MED ORDER — FLUCONAZOLE IN SODIUM CHLORIDE 400-0.9 MG/200ML-% IV SOLN: 400.0000 mg | INTRAVENOUS | Status: DC

## 2021-12-19 MED ORDER — SODIUM CHLORIDE 0.9% FLUSH: 10.0000 mL | INTRAVENOUS | Status: DC | PRN

## 2021-12-19 MED ORDER — SODIUM CHLORIDE 0.9 % IV SOLN
100.0000 mg | INTRAVENOUS | Status: AC
  Administered 2021-12-19: 100 mg via INTRAVENOUS
  Filled 2021-12-19: qty 5

## 2021-12-19 MED ORDER — CHLORHEXIDINE GLUCONATE CLOTH 2 % EX PADS
6.0000 | MEDICATED_PAD | Freq: Every day | CUTANEOUS | Status: DC
  Administered 2021-12-21 – 2022-01-14 (×24): 6 via TOPICAL

## 2021-12-19 MED ORDER — MELATONIN 3 MG PO TABS: 3.0000 mg | ORAL_TABLET | Freq: Every day | ORAL | Status: DC

## 2021-12-19 NOTE — Progress Notes (Signed)
IV team consult for new IV. Pt. Has severe infiltration on the right FA.  IV has been in place for approx. 18 hrs.  Entire arm is edematous with blisters draining clear fluid. Pt. Is responsive to pain and calling her name, but only briefly opens eyes. She was able to briefly follow command of hand squeezing after a chest rub, then immediately was back to sleep. Pt. Was non-verbal. I was able to insert a 22 ga in Left FA. However, due to severity of infiltration within 12 hrs of last insertion, pt severe lethargy, and 3 IV antibiotics, central line should be considered. RN and provider notified. ?

## 2021-12-19 NOTE — Progress Notes (Addendum)
?PROGRESS NOTE ? ? ? ?Shannon [name]  LZJ:[id number] DOB: 1956-01-25 DOA: 12/17/2021 ?PCP: [name], MD ? ? ?Brief Narrative:  ?HPI: Shannon [name] is a 66 y.o. female with medical history significant of metastatic recurrent non-small cell lung cancer (01/2020) s/p curative radiotherapy (03/2020), with recurrence in February 2023, asthma, anxiety, COPD, GERD, hypertension, hyperlipidemia and HIV was brought in to the emergency department from [facility] due to worsening of the right lower extremity wound.  Patient is a poor historian.  According to patient, she started having small blister on the dorsum of the right foot about 3 to 4 weeks ago.  For this she started medical attention in the ED on 12/02/2021.  According to her, she has been prescribed multiple antibiotics.  Currently patient is extremely emotional and cannot remember the names of her the dates of antibiotics and whether she ever completed any of those courses.  Nonetheless, she was noted to have worsening of the right lower extremity wound so she was sent to the ED today.  Patient denies any other complaint other than right lower extremity pain.  No fever, chills, sweating or any other complaint. ?  ?ED Course: Upon arrival to ED, she was slightly tachycardic with slightly elevated blood pressure but she was afebrile and no tachypnea.  White cells were only 11.5.  Her CKD stage IIIa was at baseline.  She was diagnosed with right foot abscess and possible necrotizing fasciitis based on the CT of the lower extremity.  ED physician had consulted with orthopedics who recommended admission under hospitalist service and they we will proceed with surgical intervention tonight. ? ?Assessment & Plan: ?  ?Principal Problem: ?  Abscess of foot ?Active Problems: ?  HIV disease ([place]) ?  Uncontrolled hypertension ?  Morbid (severe) obesity due to excess calories ([place]) ?  Atrial flutter ([place]) ?  Chronic anticoagulation ?  (HFpEF) heart failure with preserved  ejection fraction ([place]) ?  Adenocarcinoma of lung ([place]) ?  Abscess of right foot ?  Necrotizing fasciitis ([place]) ? ?Abscess and necrotizing fasciitis of the right lower extremity and mild cellulitis of left lower extremity: S/p right foot incision and drainage of the deep abscess by orthopedics on 12/17/2021.  She is going gram-negative rods and gram-positive cocci in pairs.  Currently on cefepime, linezolid and Flagyl.  Orthopedics on board as well.  ID has been consulted for guidance.  Per reports from nursing staff, she has been very hard stick for several people have tried, we will proceed with PICC line, she may end up requiring prolonged IV antibiotics. ?  ?HIV disease: Continue home medications/Biktarvy. ?  ?Permanent atrial flutter/fibrillation: Rate is controlled.  Continue diltiazem.  We will hold Xarelto.  Since there is some chance that patient need another surgical intervention and plans are clear, we will continue her on full dose of Lovenox in the meantime. ?  ?Hyperkalemia: Resolved. ?  ?Type 2 diabetes mellitus: Hemoglobin A1c 7.3.  Blood sugar controlled on SSI. ?  ?AKI on CKD stage IIIa: Patient's baseline creatinine appears to be around 1.1 with GFR between 55 and 60 but currently creatinine 1.86 with GFR 29.  Likely secondary to infection.  Creatinine Stable compared to yesterday. Continue IV fluids.  Repeat labs in the morning. ? ?Lethargy: Patient was slightly lethargic and sleepy this morning, could very well be due to opioids.  Vitals are stable.  Monitor closely.  At risk of delirium.  Will start on Seroquel tonight. ? ?Received a  message from primary RN earlier informing me that orthopedics had seen the patient and they had noted some infiltration in the right upper extremity and wanted to convey the message to me to consult orthopedics to possibly take a look at that and rule out infection.  Patient was also lethargic with abdominal pain and breathing.  Patient seen and examined at the  bedside.  Patient was confused and slightly lethargic.  On my examination, patient did have some signs of infiltration and superficial blisters in the right upper extremity.  Stat chest x-ray was done which shows no new pathology.  Stat ABG was done which showed respiratory acidosis.  I called and discussed the case with Dr. Kathaleen Bury of orthopedics who had already called hand surgeon and it appears that they have already seen the patient and have ordered CT scan as well as ultrasound.  Ultrasound report is negative for any DVT.  CT is still pending.  Based on the respiratory acidosis, orders were placed to start the patient on BiPAP.  Reportedly, patient slightly more alert now.  We will repeat her ABG in 6 hours.  Continue close monitoring.  Appreciate orthopedics and hand surgery. ? ?DVT prophylaxis:  ?  Code Status: Full Code  ?Family Communication:  None present at bedside.  Plan of care discussed with patient in length and he/she verbalized understanding and agreed with it. ? ?Status is: Inpatient ?Remains inpatient appropriate because: Needs further plan clarification by orthopedics. ? ? ?Estimated body mass index is 38.63 kg/m? as calculated from the following: ?  Height as of this encounter: 5\' 5"  (1.651 m). ?  Weight as of this encounter: 105.3 kg. ? ?  ?Nutritional Assessment: ?Body mass index is 38.63 kg/m?Marland KitchenMarland Kitchen ?Seen by dietician.  I agree with the assessment and plan as outlined below: ?Nutrition Status: ?Nutrition Problem: Increased nutrient needs ?Etiology: wound healing ?Signs/Symptoms: estimated needs ?Interventions: Ensure Enlive (each supplement provides 350kcal and 20 grams of protein), MVI, Prostat, Juven ? ?. ?Skin Assessment: ?I have examined the patient's skin and I agree with the wound assessment as performed by the wound care RN as outlined below: ?  ? ?Consultants:  ?Orthopedics ? ?Procedures:  ?As above ? ?Antimicrobials:  ?Anti-infectives (From admission, onward)  ? ? Start     Dose/Rate  Route Frequency Ordered Stop  ? 12/18/21 0500  metroNIDAZOLE (FLAGYL) IVPB 500 mg       ? 500 mg ?100 mL/hr over 60 Minutes Intravenous Every 12 hours 12/17/21 1851    ? 12/18/21 0400  ceFEPIme (MAXIPIME) 2 g in sodium chloride 0.9 % 100 mL IVPB       ? 2 g ?200 mL/hr over 30 Minutes Intravenous Every 12 hours 12/17/21 1851    ? 12/18/21 0100  linezolid (ZYVOX) IVPB 600 mg       ? 600 mg ?300 mL/hr over 60 Minutes Intravenous Every 12 hours 12/17/21 1851    ? 12/17/21 2255  vancomycin (VANCOCIN) powder  Status:  Discontinued       ?   As needed 12/17/21 2324 12/18/21 0132  ? 12/17/21 1745  bictegravir-emtricitabine-tenofovir AF (BIKTARVY) 50-200-25 MG per tablet 1 tablet  Status:  Discontinued       ? 1 tablet Oral Daily 12/17/21 1737 12/17/21 1802  ? 12/17/21 1745  vancomycin (VANCOCIN) IVPB 1000 mg/200 mL premix  Status:  Discontinued       ? 1,000 mg ?200 mL/hr over 60 Minutes Intravenous  Once 12/17/21 1737 12/17/21 1742  ? 12/17/21  1745  meropenem (MERREM) 1 g in sodium chloride 0.9 % 100 mL IVPB  Status:  Discontinued       ? 1 g ?200 mL/hr over 30 Minutes Intravenous  Once 12/17/21 1737 12/17/21 1852  ? 12/17/21 1745  clindamycin (CLEOCIN) capsule 600 mg  Status:  Discontinued       ? 600 mg Oral Every 8 hours 12/17/21 1737 12/17/21 1851  ? 12/17/21 1530  ceFEPIme (MAXIPIME) 2 g in sodium chloride 0.9 % 100 mL IVPB  Status:  Discontinued       ? 2 g ?200 mL/hr over 30 Minutes Intravenous 2 times daily 12/17/21 1526 12/17/21 1800  ? 12/17/21 1530  metroNIDAZOLE (FLAGYL) IVPB 500 mg       ? 500 mg ?100 mL/hr over 60 Minutes Intravenous Every 12 hours 12/17/21 1526 12/17/21 1830  ? 12/17/21 1515  clindamycin (CLEOCIN) capsule 300 mg  Status:  Discontinued       ? 300 mg Oral  Once 12/17/21 1505 12/17/21 1526  ? 12/17/21 1445  bictegravir-emtricitabine-tenofovir AF (BIKTARVY) 50-200-25 MG per tablet 1 tablet       ? 1 tablet Oral Daily 12/17/21 1438    ? 12/17/21 1245  linezolid (ZYVOX) IVPB 600 mg  Status:   Discontinued       ? 600 mg ?300 mL/hr over 60 Minutes Intravenous Every 12 hours 12/17/21 1233 12/17/21 1802  ? 12/17/21 1130  vancomycin (VANCOCIN) IVPB 1000 mg/200 mL premix  Status:  Discontinued       ? 1

## 2021-12-19 NOTE — Progress Notes (Signed)
ANTICOAGULATION CONSULT NOTE - Follow Up Consult ? ?Pharmacy Consult for Lovenox + Cefepime ?Indication: h/o afib and PE, necrotizing fasciitis ? ?Allergies  ?Allergen Reactions  ? Lisinopril Swelling and Cough  ?  Face/throat swelling  ? Tree Extract Swelling and Other (See Comments)  ?  Swelling to eyes  ? Augmentin [Amoxicillin-Pot Clavulanate] Other (See Comments)  ?  Headache, dizzy  ? Ciprofloxacin Hives  ? ? ?Patient Measurements: ?Height: 5\' 5"  (165.1 cm) ?Weight: 105.3 kg (232 lb 2.3 oz) ?IBW/kg (Calculated) : 57 ? ?Vital Signs: ?Temp: 99 ?F (37.2 ?C) (04/28 0421) ?Temp Source: Oral (04/28 0421) ?BP: 153/77 (04/28 0421) ?Pulse Rate: 98 (04/28 0421) ? ?Labs: ?Recent Labs  ?  12/17/21 ?0321 12/18/21 ?2248 12/19/21 ?0346  ?HGB 12.5 12.2 10.8*  ?HCT 39.1 39.2 35.2*  ?PLT 191 163 146*  ?CREATININE 1.86* 1.58* 1.60*  ? ? ?Estimated Creatinine Clearance: 41.1 mL/min (A) (by C-G formula based on SCr of 1.6 mg/dL (H)). ? ? ?Assessment: ?AC/Heme: hx afib and PE on xarelto 20 mg daily PTA (LD 4/25 AM)>>Lovenox  ?- Hgb 12.2>10.8, Plts 146 low end, watch ? ?ID: Necrotizing fasciitis. I&D on 4/26. HIV positive, on Biktarvy (viral load undetectable) ?- 4/26 right foot CT: Extensive dorsal midfoot soft tissue gas concerning for necrotizing soft tissue infection. ?- 4/26 tibia/fibula CT: Extensive dorsal midfoot soft tissue gas concerning for necrotizing soft tissue infection ?- Afebrile, WBC WNL, Scr 1.6 ? ?4/26 CTX x1 ?4/26 linezolid>> ?4/26 cefepime>> ?4/26  flagyl>> ? ?4/26 Bcx x2: NGTD ?4/26 Wound abscess Cx x 3: GPC, GNR ? ?Goal of Therapy:  ?Anti-Xa level 0.6-1 units/ml 4hrs after LMWH dose given ?Monitor platelets by anticoagulation protocol: Yes ?  ?Plan:  ?- cefepime 2gm q12h ?- zyvox 600 mg q12h ?- flagyl 500 mg q12h ? ?Lovenox 100 mg Q12 ?F/u restart of PTA Xarelto when no more plans for OR ? ? ?Cailen Texeira S. Alford Highland, PharmD, BCPS ?Clinical Staff Pharmacist [email or web address] ? ?Alford Highland, [employer] ?12/19/2021,7:35 AM ? ? ?

## 2021-12-19 NOTE — Progress Notes (Signed)
PT Cancellation Note ? ?Patient Details ?Name: Shannon [name] ?MRN: [id number] ?DOB: 1955-11-07 ? ? ?Cancelled Treatment:    Reason Eval/Treat Not Completed: Medical issues which prohibited therapy (per RN, pt has critical ABGs and is not appropriate for PT today. Will follow.) ? ?Blondell Reveal Kistler PT 12/19/2021  ?Acute Rehabilitation Services ?Pager [phone] ?Office [phone] ? ?

## 2021-12-19 NOTE — Progress Notes (Signed)
PHARMACY - PHYSICIAN COMMUNICATION ?CRITICAL VALUE ALERT - BLOOD CULTURE IDENTIFICATION (BCID) ? ?Shannon [name] is an 66 y.o. female who presented to [facility] on 12/17/2021 with a chief complaint of worsening of the right lower extremity wound. ? ?Assessment:  1 (aerobic) bottle of 2 positive for yeast; BCID detected candida parapsilosis. ? ?Name of physician (or Provider) Contacted: Retia Passe ? ?Current antibiotics: micafungin 100mg  IV q24h (received a dose today, 4/28), linezolid 600mg  IV q12h, Zosyn 3.375g infused over 4 hours q8h. ? ?Changes to prescribed antibiotics recommended:  ?Provider accepted recommendation to change antifungal to fluconazole starting tomorrow tomorrow (800 mg IV x 1 then 400 mg IV once daily) per Rapid ID Treatment Algorithm.   ? ?Results for orders placed or performed during the hospital encounter of 12/17/21  ?Blood Culture ID Panel (Reflexed) (Collected: 12/17/2021 11:19 AM)  ?Result Value Ref Range  ? Enterococcus faecalis NOT DETECTED NOT DETECTED  ? Enterococcus Faecium NOT DETECTED NOT DETECTED  ? Listeria monocytogenes NOT DETECTED NOT DETECTED  ? Staphylococcus species NOT DETECTED NOT DETECTED  ? Staphylococcus aureus (BCID) NOT DETECTED NOT DETECTED  ? Staphylococcus epidermidis NOT DETECTED NOT DETECTED  ? Staphylococcus lugdunensis NOT DETECTED NOT DETECTED  ? Streptococcus species NOT DETECTED NOT DETECTED  ? Streptococcus agalactiae NOT DETECTED NOT DETECTED  ? Streptococcus pneumoniae NOT DETECTED NOT DETECTED  ? Streptococcus pyogenes NOT DETECTED NOT DETECTED  ? A.calcoaceticus-baumannii NOT DETECTED NOT DETECTED  ? Bacteroides fragilis NOT DETECTED NOT DETECTED  ? Enterobacterales NOT DETECTED NOT DETECTED  ? Enterobacter cloacae complex NOT DETECTED NOT DETECTED  ? Escherichia coli NOT DETECTED NOT DETECTED  ? Klebsiella aerogenes NOT DETECTED NOT DETECTED  ? Klebsiella oxytoca NOT DETECTED NOT DETECTED  ? Klebsiella pneumoniae NOT DETECTED NOT DETECTED  ? Proteus  species NOT DETECTED NOT DETECTED  ? Salmonella species NOT DETECTED NOT DETECTED  ? Serratia marcescens NOT DETECTED NOT DETECTED  ? Haemophilus influenzae NOT DETECTED NOT DETECTED  ? Neisseria meningitidis NOT DETECTED NOT DETECTED  ? Pseudomonas aeruginosa NOT DETECTED NOT DETECTED  ? Stenotrophomonas maltophilia NOT DETECTED NOT DETECTED  ? Candida albicans NOT DETECTED NOT DETECTED  ? Candida auris NOT DETECTED NOT DETECTED  ? Candida glabrata NOT DETECTED NOT DETECTED  ? Candida krusei NOT DETECTED NOT DETECTED  ? Candida parapsilosis DETECTED (A) NOT DETECTED  ? Candida tropicalis NOT DETECTED NOT DETECTED  ? Cryptococcus neoformans/gattii NOT DETECTED NOT DETECTED  ? ? ?Royetta Asal, PharmD, BCPS ?Clinical Pharmacist ?[facility] ?Please utilize Amion for appropriate phone number to reach the unit pharmacist ([place]) ?12/19/2021 6:26 PM ? ? ?

## 2021-12-19 NOTE — Consult Note (Addendum)
?  [facility] for Infectious Disease  Total days of antibiotics 3 ?        ?Reason for Consult: necrotizing skin infection of right leg   ?Referring Physician: pahwani ? ?Principal Problem: ?  Abscess of foot ?Active Problems: ?  HIV disease ([place]) ?  Uncontrolled hypertension ?  Morbid (severe) obesity due to excess calories ([place]) ?  Atrial flutter ([place]) ?  Chronic anticoagulation ?  (HFpEF) heart failure with preserved ejection fraction ([place]) ?  Adenocarcinoma of lung ([place]) ?  Abscess of right foot ?  Necrotizing fasciitis ([place]) ? ? ? ?HPI: Shannon [name] is a 66 y.o. 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 She was seen by dr Earlie Server on April 6th to discuss possible treatment options. She was no show at ID appt with me in mid April. She did attend onc visit on 4/26 that where she complained of right leg pain on exam had significant edema, discoloration, bullae formation.she was admitted directly to hospital for further management. Imaging concerning for nec fascitis. Ortho able to do I X D of lesion to anterior tibia and some to dorsum of foot. She was placed on linezolid, cefepime, and metronidazole. Cx growing GNR and GPC, preliminary is kleb pneumonia. She still is soloment.  Blood gas this am showing respiratory acidosis- showing elevated pCO2. ? ?Past Medical History:  ?Diagnosis Date  ? Anemia   ? Anxiety   ? HX PANIC ATTACKS  ? Arthritis   ? "starting to; in my hands" (07/09/2015)  ? Asthma   ? Atrial fibrillation ([place])   ? Atrial flutter, paroxysmal ([place])   ? Bloated abdomen   ? CFS (chronic fatigue syndrome)   ? Chewing difficulty   ? Chronic asthma with acute exacerbation   ? "I have chronic asthma all the time;  sometimes exacerbations" (07/09/2015)  ? Chronic lower back pain   ? COPD (chronic obstructive pulmonary disease) ([place])   ? Cyst of right kidney   ? "3 of them; dx'd in ~ 01/2015"  ? Dyspnea   ? GERD (gastroesophageal reflux disease)   ? Heart murmur   ? History of blood transfusion   ? "related to my brain surgery I think"  ? History of pulmonary embolism 07/09/2015  ? History of radiation therapy 04/09/20-04/22/20  ? SBRT Left Lung, Dr. Gery Pray  ? History of radiation therapy   ? lumbar spine 10/08/2021-10/28/2021  Dr Gery Pray  ? HIV disease ([place])   ? Hyperlipidemia   ? Hypertension   ? Leg edema   ? Lipodystrophy   ? Mild CAD 2013  ? Multiple thyroid nodules   ? Osteopenia   ? Palpitations   ? Pneumonia 07/09/2015  ? Shingles   ? Vitamin B 12 deficiency   ? Vitamin D deficiency   ? ? ?Allergies:  ?Allergies  ?Allergen Reactions  ? Lisinopril Swelling and Cough  ?  Face/throat swelling  ? Tree Extract Swelling and Other (See Comments)  ?  Swelling to eyes  ? Augmentin [Amoxicillin-Pot Clavulanate] Other (See Comments)  ?  Headache, dizzy  ? Ciprofloxacin Hives  ? ? ? ?MEDICATIONS: ? (feeding supplement) PROSource Plus  30 mL Oral BID BM  ? vitamin C  500 mg Oral BID  ? bictegravir-emtricitabine-tenofovir AF  1 tablet Oral Daily  ? diltiazem  240 mg Oral Daily  ? docusate sodium  100 mg Oral BID  ? enoxaparin (LOVENOX) injection  100 mg Subcutaneous Q12H  ? famotidine  40 mg Oral Daily  ? feeding supplement  237 mL Oral BID BM  ? insulin aspart  0-15 Units Subcutaneous Q4H  ? multivitamin with minerals  1 tablet Oral Daily  ? QUEtiapine  25 mg Oral QHS  ? spironolactone  25 mg Oral Daily  ? zinc sulfate  220 mg Oral Daily  ? ? ?Social History  ? ?Tobacco Use  ? Smoking status: Former  ?  Types: Cigarettes  ? Smokeless tobacco: Never  ? Tobacco comments:  ?  Pt states she smoked when she was in college many years ago.  ?Vaping Use  ? Vaping Use: Never used  ?Substance Use Topics  ? Alcohol use: Not Currently  ?   Alcohol/week: 0.0 standard drinks  ?  Comment: Rarely.  ? Drug use: No  ? ? ?Family History  ?Problem Relation Age of Onset  ? Asthma Mother   ? Heart failure Mother   ?     cardiomyopathy  ? Thyroid disease Mother   ? Heart disease Mother   ? Obesity Mother   ? Hypertension Mother   ? Heart murmur Sister   ? Heart murmur Brother   ? Diabetes Sister   ? Thyroid disease Sister   ? Heart murmur Sister   ? ? ?Review of Systems  ?Constitutional: Negative for fever, chills, diaphoresis, activity change, appetite change, fatigue and unexpected weight change.  ?HENT: Negative for congestion, sore throat, rhinorrhea, sneezing, trouble swallowing and sinus pressure.  ?Eyes: Negative for photophobia and visual disturbance.  ?Respiratory: Negative for cough, chest tightness, shortness of breath, wheezing and stridor.  ?Cardiovascular: Negative for chest pain, palpitations and leg swelling.  ?Gastrointestinal: Negative for nausea, vomiting, abdominal pain, diarrhea, constipation, blood in stool, abdominal distention and anal bleeding.  ?Genitourinary: Negative for dysuria, hematuria, flank pain and difficulty urinating.  ?Musculoskeletal: Negative for myalgias, back pain, joint swelling, arthralgias and gait problem.  ?Skin: Negative for color change, pallor, rash and wound.  ?Neurological: Negative for dizziness, tremors, weakness and light-headedness.  ?Hematological: Negative for adenopathy. Does not bruise/bleed easily.  ?Psychiatric/Behavioral: Negative for behavioral problems, confusion, sleep disturbance, dysphoric mood, decreased concentration and agitation.  ? ? ?OBJECTIVE: ?Temp:  [98 ?F (36.7 ?C)-99.2 ?F (37.3 ?C)] 99 ?F (37.2 ?C) (04/28 1249) ?Pulse Rate:  [91-102] 93 (04/28 1249) ?Resp:  [14-20] 16 (04/28 0850) ?BP: (120-156)/(73-98) 120/79 (04/28 1249) ?SpO2:  [95 %-99 %] 98 % (04/28 1249) ?Weight:  [105.3 kg] 105.3 kg (04/28 0354) ?Physical Exam  ?Constitutional:  sedated. appears well-developed and  well-nourished. No distress.  ?HENT: [place]/AT, PERRLA, no scleral icterus ?Mouth/Throat: Oropharynx is clear and moist. No oropharyngeal exudate.  ?Cardiovascular: Normal rate, regular rhythm and normal heart sounds. Exam reveals no gallop and no friction rub.  ?No murmur heard.  ?Pulmonary/Chest: Effort normal and breath sounds normal. No respiratory distress.  has no wheezes.  ?Neck = supple, no nuchal rigidity ?Abdominal: Soft. Bowel sounds are normal.  exhibits no distension. There is no tenderness.  ?Skin: Skin is warm and dry. No rash noted. No erythema.  ? ?LABS: ?Results for orders placed or performed during the hospital encounter of 12/17/21 (from the past 48 hour(s))  ?Lactic acid, plasma     Status: None  ? Collection  Time: 12/17/21  9:02 PM  ?Result Value Ref Range  ? Lactic Acid, Venous 1.5 0.5 - 1.9 mmol/L  ?  Comment: Performed at [facility], [place] [address] [place], [place] [address]  ?Sedimentation rate     Status: None  ? Collection Time: 12/17/21  9:02 PM  ?Result Value Ref Range  ? Sed Rate 15 0 - 22 mm/hr  ?  Comment: Performed at [facility], [place] [address], [place], [place] [address]  ?C-reactive protein     Status: Abnormal  ? Collection Time: 12/17/21  9:02 PM  ?Result Value Ref Range  ? CRP 8.0 (H) <1.0 mg/dL  ?  Comment: Performed at [facility], [place] [address] [place], [place] [address]  ?Hemoglobin A1c     Status: Abnormal  ? Collection Time: 12/17/21  9:02 PM  ?Result Value Ref Range  ? Hgb A1c MFr Bld 7.3 (H) 4.8 - 5.6 %  ?  Comment: (NOTE) ?Pre diabetes:          5.7%-6.4% ? ?Diabetes:              >6.4% ? ?Glycemic control for   <7.0% ?adults with diabetes ?  ? Mean Plasma Glucose 162.81 mg/dL  ?  Comment: Performed at [facility], [place] [address] [place], [place] [address]  ?Aerobic/Anaerobic Culture w Gram Stain (surgical/deep wound)     Status: None (Preliminary result)  ? Collection Time: 12/17/21 10:17 PM  ? Specimen: Wound;  Abscess  ?Result Value Ref Range  ? Specimen Description    ?  WOUND ?Performed at [facility], [place] [address], [place], [place] [address] ?  ? Special Requests    ?  NONE ?Performed at [facility]

## 2021-12-19 NOTE — Progress Notes (Addendum)
0045 Bladder Scanned 245cc ? ?3734 Pt has not voided this shift. Encouraged pt to try and void in [place]. Bladder scanned at 511cc in bladder. No C/O. Pt to received bath and will try again after movement and stimulation.  ? ?0530 In/Out intermittent cath 800 cc clear yellow urine out without difficulty with 2 assist. Will continue to monitor. ?

## 2021-12-19 NOTE — Progress Notes (Signed)
RUE has been completed.  Exam VERY limited due to patient condition & bandages. ? ? ?Results can be found under chart review under CV PROC. ?12/19/2021 3:46 PM ?Bran Aldridge RVT, RDMS ? ?

## 2021-12-19 NOTE — Progress Notes (Signed)
Peripherally Inserted Central Catheter Placement ? ?The IV Nurse has discussed with the patient and/or persons authorized to consent for the patient, the purpose of this procedure and the potential benefits and risks involved with this procedure.  The benefits include less needle sticks, lab draws from the catheter, and the patient may be discharged home with the catheter. Risks include, but not limited to, infection, bleeding, blood clot (thrombus formation), and puncture of an artery; nerve damage and irregular heartbeat and possibility to perform a PICC exchange if needed/ordered by physician.  Alternatives to this procedure were also discussed.  Bard Power PICC patient education guide, fact sheet on infection prevention and patient information card has been provided to patient /or left at bedside.   ? ?PICC Placement Documentation  ?PICC Double Lumen 12/19/21 Left Brachial 44 cm 0 cm (Active)  ?Indication for Insertion or Continuance of Line Poor Vasculature-patient has had multiple peripheral attempts or PIVs lasting less than 24 hours;Prolonged intravenous therapies 12/19/21 1450  ?Exposed Catheter (cm) 0 cm 12/19/21 1450  ?Site Assessment Clean, Dry, Intact 12/19/21 1450  ?Lumen #1 Status Flushed;Saline locked;Blood return noted 12/19/21 1450  ?Lumen #2 Status Flushed;Saline locked;Blood return noted 12/19/21 1450  ?Dressing Type Securing device;Transparent 12/19/21 1450  ?Dressing Status Antimicrobial disc in place;Clean, Dry, Intact 12/19/21 1450  ?Dressing Change Due 12/26/21 12/19/21 1450  ? ? ? ? ? ?Shon Hale ?12/19/2021, 3:11 PM ? ?

## 2021-12-19 NOTE — Progress Notes (Signed)
OT Cancellation Note ? ?Patient Details ?Name: Shannon [name] ?MRN: [id number] ?DOB: 1956/03/28 ? ? ?Cancelled Treatment:    Reason Eval/Treat Not Completed: Medical issues which prohibited therapy.  ? ?Lenward Chancellor ?12/19/2021, 1:37 PM ?

## 2021-12-19 NOTE — Consult Note (Signed)
WOC Nurse Consult Note: ?Reason for Consult:NPWT placement to distal RLE wound ?Wound type:surgical, infectious ?Pressure Injury POA: N/A ?Measurement: ?RLE distal wound (full thickness):4cm x 0.2cm x 0.2cm ?RLE proximal wound (partial thickness) ?Wound bed: RLE distal wound: Obscured by dried blood ?RLE Proximal wound, pink, moist ?Drainage (amount, consistency, odor) small, serous from both ?Periwound: RLE distal wound: 12cm x 10cm area of purple, deep red friable tissue with sponge-like tension to tissue/bogginess ? ?Dressing procedure/placement/frequency: ?I consulted with Dr. Kathaleen Bury via phone and recommend NPWT be paused due to friability of surrounding tissue and size of wound in favor of daily wound care consistent with that of the right foot. He has provided Orders which I have transcribed into Nursing guidance for daily wound care to the distal full thickness RLE wound using sterile iodoform gauze to fill defect topped with dry gauze, ABD pad and secured with Kerlix roll gauze/paper tape. This is to be changed daily, along with wound care to the proximal lesions (partial thickness) and right foot wound-which I did not see today-and PRN drainage strike through to the exterior dressing. ? ?The RUE is with several intact, serum filled blisters/bullae from an IV infiltrate. I have provided conservative guidance for those areas for Nursing using a saline cleanse, gentle pat dry and cover with antimicrobial nonadherent xeroform gauze, ABD, Kerlix roll gauze/paper tape.Changes can be daily. If an alternative POC is desired for the areas, please consult hospitalist.  ? ?[place] nursing team will not follow, but will remain available to this patient, the nursing and medical teams.  Please re-consult if needed. ?Thanks, ?Maudie Flakes, MSN, RN, [place], [place], CWON-AP, [place]  ?Pager# [phone]  ? ? ? ?  ?

## 2021-12-19 NOTE — Consult Note (Signed)
Reason for Consult:RUE edema ?Referring Physician: Armond Hang ?Time called: 1210 ?Time at bedside: 1259 ? ? ?Shannon [name] is an 66 y.o. female.  ?HPI: Shannon [name] was admitted 2d ago with a severe RLE infection and underwent I&D by Dr. Kathaleen Bury. This morning it was noted that her right arm was swollen and blistered and that the right AC IV had infiltrated. Swelling was thought to be due to that but hand surgery was asked to weigh in to make sure this wasn't another infectious process. She is obtunded and cannot really contribute to history or much of an exam. ? ?Past Medical History:  ?Diagnosis Date  ? Anemia   ? Anxiety   ? HX PANIC ATTACKS  ? Arthritis   ? "starting to; in my hands" (07/09/2015)  ? Asthma   ? Atrial fibrillation ([place])   ? Atrial flutter, paroxysmal ([place])   ? Bloated abdomen   ? CFS (chronic fatigue syndrome)   ? Chewing difficulty   ? Chronic asthma with acute exacerbation   ? "I have chronic asthma all the time; sometimes exacerbations" (07/09/2015)  ? Chronic lower back pain   ? COPD (chronic obstructive pulmonary disease) ([place])   ? Cyst of right kidney   ? "3 of them; dx'd in ~ 01/2015"  ? Dyspnea   ? GERD (gastroesophageal reflux disease)   ? Heart murmur   ? History of blood transfusion   ? "related to my brain surgery I think"  ? History of pulmonary embolism 07/09/2015  ? History of radiation therapy 04/09/20-04/22/20  ? SBRT Left Lung, Dr. Gery Pray  ? History of radiation therapy   ? lumbar spine 10/08/2021-10/28/2021  Dr Gery Pray  ? HIV disease ([place])   ? Hyperlipidemia   ? Hypertension   ? Leg edema   ? Lipodystrophy   ? Mild CAD 2013  ? Multiple thyroid nodules   ? Osteopenia   ? Palpitations   ? Pneumonia 07/09/2015  ? Shingles   ? Vitamin B 12 deficiency   ? Vitamin D deficiency   ? ? ?Past Surgical History:  ?Procedure Laterality Date  ? ABDOMINAL HYSTERECTOMY    ? "robotic laparosopic"  ? BRAIN SURGERY  1974  ? "brain tumor; benign; on top of my brain; got a plate in  there"  ? BRAIN SURGERY    ? age 54- -"Tumor pushing my skullout"  ? BRONCHIAL BIOPSY  02/13/2020  ? Procedure: BRONCHIAL BIOPSIES;  Surgeon: [name], MD;  Location: [facility] ENDOSCOPY;  Service: Pulmonary;;  ? BRONCHIAL BRUSHINGS  02/13/2020  ? Procedure: BRONCHIAL BRUSHINGS;  Surgeon: [name], MD;  Location: [facility] ENDOSCOPY;  Service: Pulmonary;;  ? BRONCHIAL NEEDLE ASPIRATION BIOPSY  02/13/2020  ? Procedure: BRONCHIAL NEEDLE ASPIRATION BIOPSIES;  Surgeon: [name], MD;  Location: [facility] ENDOSCOPY;  Service: Pulmonary;;  ? CARDIAC CATHETERIZATION    ? FIDUCIAL MARKER PLACEMENT  02/13/2020  ? Procedure: FIDUCIAL MARKER PLACEMENT;  Surgeon: [name], MD;  Location[facility] ENDOSCOPY;  Service: Pulmonary;;  ? IRRIGATION AND DEBRIDEMENT FOOT Right 12/17/2021  ? Procedure: IRRIGATION AND DEBRIDEMENT, RIGHT FOOT AND CALF;  Surgeon: Armond Hang, MD;  Location: [facility] ORS;  Service: Orthopedics;  Laterality: Right;  ? TONSILLECTOMY AND ADENOIDECTOMY    ? VIDEO BRONCHOSCOPY WITH ENDOBRONCHIAL NAVIGATION N/A 02/13/2020  ? Procedure: VIDEO BRONCHOSCOPY WITH ENDOBRONCHIAL NAVIGATION;  Surgeon: [name], MD;  Location: [facility] ENDOSCOPY;  Service: Pulmonary;  Laterality: N/A;  ? ? ?Family History  ?Problem Relation Age of  Onset  ? Asthma Mother   ? Heart failure Mother   ?     cardiomyopathy  ? Thyroid disease Mother   ? Heart disease Mother   ? Obesity Mother   ? Hypertension Mother   ? Heart murmur Sister   ? Heart murmur Brother   ? Diabetes Sister   ? Thyroid disease Sister   ? Heart murmur Sister   ? ? ?Social History:  reports that she has quit smoking. Her smoking use included cigarettes. She has never used smokeless tobacco. She reports that she does not currently use alcohol. She reports that she does not use drugs. ? ?Allergies:  ?Allergies  ?Allergen Reactions  ? Lisinopril Swelling and Cough  ?  Face/throat swelling  ? Tree Extract Swelling and Other (See Comments)  ?  Swelling to eyes  ? Augmentin  [Amoxicillin-Pot Clavulanate] Other (See Comments)  ?  Headache, dizzy  ? Ciprofloxacin Hives  ? ? ?Medications: I have reviewed the patient's current medications. ? ?Results for orders placed or performed during the hospital encounter of 12/17/21 (from the past 48 hour(s))  ?Lactic acid, plasma     Status: None  ? Collection Time: 12/17/21  9:02 PM  ?Result Value Ref Range  ? Lactic Acid, Venous 1.5 0.5 - 1.9 mmol/L  ?  Comment: Performed at [facility], [place] [address] [place], [place] [address]  ?Sedimentation rate     Status: None  ? Collection Time: 12/17/21  9:02 PM  ?Result Value Ref Range  ? Sed Rate 15 0 - 22 mm/hr  ?  Comment: Performed at [facility], [place] [address], [place], [place] [address]  ?C-reactive protein     Status: Abnormal  ? Collection Time: 12/17/21  9:02 PM  ?Result Value Ref Range  ? CRP 8.0 (H) <1.0 mg/dL  ?  Comment: Performed at [facility], [place] [address], [place], [place] [address]  ?Hemoglobin A1c     Status: Abnormal  ? Collection Time: 12/17/21  9:02 PM  ?Result Value Ref Range  ? Hgb A1c MFr Bld 7.3 (H) 4.8 - 5.6 %  ?  Comment: (NOTE) ?Pre diabetes:          5.7%-6.4% ? ?Diabetes:              >6.4% ? ?Glycemic control for   <7.0% ?adults with diabetes ?  ? Mean Plasma Glucose 162.81 mg/dL  ?  Comment: Performed at [facility], [place] [address], [place], [place] [address]  ?Aerobic/Anaerobic Culture w Gram Stain (surgical/deep wound)     Status: None (Preliminary result)  ? Collection Time: 12/17/21 10:17 PM  ? Specimen: Wound; Abscess  ?Result Value Ref Range  ? Specimen Description    ?  WOUND ?Performed at [facility], [place] [address] [place], [place] [address] ?  ? Special Requests    ?  NONE ?Performed at [facility], [place] [address], [place], [place] [address] ?  ? Gram Stain    ?  RARE WBC PRESENT, PREDOMINANTLY PMN ?ABUNDANT GRAM NEGATIVE RODS ?FEW GRAM POSITIVE COCCI IN PAIRS ?  ?  Culture    ?  ABUNDANT GRAM NEGATIVE RODS ?CULTURE REINCUBATED FOR BETTER GROWTH ?Performed at [facility], [place] [address], [place],  [address] ?  ? Report Status PENDING   ?Aerobic/Anaerobic Culture w Gram Stain (surgical/deep wound)     Status: None (Preliminary result)  ? Collection Time:  12/17/21 10:20 PM  ? Specimen: Wound; Abscess  ?Result Value Ref Range  ? Specimen Description    ?  WOUND ?Performed at [facility], [place] [address], [place], [place] [address] ?  ? Special Requests    ?  NONE ?Performed at [facility], [place] [address], [place], [place] [address] ?  ? Gram Stain    ?  RARE WBC PRESENT, PREDOMINANTLY PMN ?NO ORGANISMS SEEN ?Performed at [facility], [place] [address], [place], [place] [address] ?  ? Culture FEW GRAM NEGATIVE RODS   ? Report Status PENDING   ?Aerobic/Anaerobic Culture w Gram Stain (surgical/deep wound)     Status: None (Preliminary result)  ? Collection Time: 12/17/21 11:01 PM  ? Specimen: Wound; Abscess  ?Result Value Ref Range  ? Specimen Description    ?  WOUND ?Performed at [facility], [place] [address], [place], [place] [address] ?  ? Special Requests    ?  NONE ?Performed at [facility], [place] [address], [place], [place] [address] ?  ? Gram Stain    ?  RARE WBC PRESENT, PREDOMINANTLY PMN ?ABUNDANT GRAM NEGATIVE RODS ?RARE GRAM POSITIVE COCCI IN PAIRS ?Performed at [facility], [place] [address] [place], [place] [address] ?  ? Culture ABUNDANT GRAM NEGATIVE RODS   ? Report Status PENDING   ?Comprehensive metabolic panel     Status: Abnormal  ? Collection Time: 12/18/21  3:23 AM  ?Result Value Ref Range  ? Sodium 142 135 - 145 mmol/L  ? Potassium 5.0 3.5 - 5.1 mmol/L  ? Chloride 104 98 - 111 mmol/L  ? CO2 32 22 - 32 mmol/L  ? Glucose, Bld 124 (H) 70 - 99 mg/dL  ?  Comment: Glucose reference range applies only to samples taken after fasting for at least 8 hours.  ? BUN 42 (H) 8 -  23 mg/dL  ? Creatinine, Ser 1.58 (H) 0.44 - 1.00 mg/dL  ? Calcium 10.3 8.9 - 10.3 mg/dL  ? Total Protein 6.0 (L) 6.5 - 8.1 g/dL  ? Albumin 2.9 (L) 3.5 - 5.0 g/dL  ? AST 18 15 - 41 U/L  ? ALT 38 0 - 44 U/L  ? Al

## 2021-12-20 ENCOUNTER — Inpatient Hospital Stay (HOSPITAL_COMMUNITY): Payer: Medicare Other

## 2021-12-20 ENCOUNTER — Inpatient Hospital Stay (HOSPITAL_COMMUNITY): Payer: Medicare Other | Admitting: Certified Registered Nurse Anesthetist

## 2021-12-20 ENCOUNTER — Encounter (HOSPITAL_COMMUNITY)
Admission: EM | Disposition: A | Discharge status: Skilled Nursing Facility | Payer: Self-pay | Source: Home / Self Care | Attending: Internal Medicine

## 2021-12-20 ENCOUNTER — Encounter (HOSPITAL_COMMUNITY): Payer: Self-pay | Admitting: Family Medicine

## 2021-12-20 DIAGNOSIS — Z87891 Personal history of nicotine dependence: Secondary | ICD-10-CM

## 2021-12-20 DIAGNOSIS — L02619 Cutaneous abscess of unspecified foot: Secondary | ICD-10-CM | POA: Diagnosis not present

## 2021-12-20 DIAGNOSIS — I5089 Other heart failure: Secondary | ICD-10-CM

## 2021-12-20 DIAGNOSIS — I2510 Atherosclerotic heart disease of native coronary artery without angina pectoris: Secondary | ICD-10-CM

## 2021-12-20 DIAGNOSIS — M7989 Other specified soft tissue disorders: Secondary | ICD-10-CM

## 2021-12-20 DIAGNOSIS — L02611 Cutaneous abscess of right foot: Secondary | ICD-10-CM | POA: Diagnosis not present

## 2021-12-20 DIAGNOSIS — I110 Hypertensive heart disease with heart failure: Secondary | ICD-10-CM

## 2021-12-20 HISTORY — PX: I & D EXTREMITY: SHX5045

## 2021-12-20 LAB — CBC WITH DIFFERENTIAL/PLATELET
Abs Immature Granulocytes: 0.03 10*3/uL (ref 0.00–0.07)
Basophils Absolute: 0 10*3/uL (ref 0.0–0.1)
Basophils Relative: 0 %
Eosinophils Absolute: 0.1 10*3/uL (ref 0.0–0.5)
Eosinophils Relative: 1 %
HCT: 31.9 % — ABNORMAL LOW (ref 36.0–46.0)
Hemoglobin: 9.7 g/dL — ABNORMAL LOW (ref 12.0–15.0)
Immature Granulocytes: 1 %
Lymphocytes Relative: 5 %
Lymphs Abs: 0.3 10*3/uL — ABNORMAL LOW (ref 0.7–4.0)
MCH: 32.4 pg (ref 26.0–34.0)
MCHC: 30.4 g/dL (ref 30.0–36.0)
MCV: 106.7 fL — ABNORMAL HIGH (ref 80.0–100.0)
Monocytes Absolute: 0.3 10*3/uL (ref 0.1–1.0)
Monocytes Relative: 4 %
Neutro Abs: 5.6 10*3/uL (ref 1.7–7.7)
Neutrophils Relative %: 89 %
Platelets: 129 10*3/uL — ABNORMAL LOW (ref 150–400)
RBC: 2.99 MIL/uL — ABNORMAL LOW (ref 3.87–5.11)
RDW: 13.3 % (ref 11.5–15.5)
WBC: 6.3 10*3/uL (ref 4.0–10.5)
nRBC: 0.3 % — ABNORMAL HIGH (ref 0.0–0.2)

## 2021-12-20 LAB — BLOOD GAS, ARTERIAL
Acid-Base Excess: 7.1 mmol/L — ABNORMAL HIGH (ref 0.0–2.0)
Acid-Base Excess: 3.1 mmol/L — ABNORMAL HIGH (ref 0.0–2.0)
Acid-Base Excess: 6.7 mmol/L — ABNORMAL HIGH (ref 0.0–2.0)
Bicarbonate: 34.2 mmol/L — ABNORMAL HIGH (ref 20.0–28.0)
Bicarbonate: 31 mmol/L — ABNORMAL HIGH (ref 20.0–28.0)
Bicarbonate: 31.9 mmol/L — ABNORMAL HIGH (ref 20.0–28.0)
Delivery systems: POSITIVE
Expiratory PAP: 8 cmH2O
FIO2: 30 %
FIO2: 40 %
Inspiratory PAP: 18 cmH2O
MECHVT: 440 mL
Mode: POSITIVE
O2 Saturation: 96.7 %
O2 Saturation: 100 %
O2 Saturation: 100 %
PEEP: 5 cmH2O
Patient temperature: 37
Patient temperature: 36.2
Patient temperature: 37
RATE: 18 resp/min
pCO2 arterial: 62 mmHg — ABNORMAL HIGH (ref 32–48)
pCO2 arterial: 64 mmHg — ABNORMAL HIGH (ref 32–48)
pCO2 arterial: 48 mmHg (ref 32–48)
pH, Arterial: 7.35 (ref 7.35–7.45)
pH, Arterial: 7.29 — ABNORMAL LOW (ref 7.35–7.45)
pH, Arterial: 7.43 (ref 7.35–7.45)
pO2, Arterial: 72 mmHg — ABNORMAL LOW (ref 83–108)
pO2, Arterial: 90 mmHg (ref 83–108)
pO2, Arterial: 111 mmHg — ABNORMAL HIGH (ref 83–108)

## 2021-12-20 LAB — BASIC METABOLIC PANEL
Anion gap: 4 — ABNORMAL LOW (ref 5–15)
BUN: 29 mg/dL — ABNORMAL HIGH (ref 8–23)
CO2: 31 mmol/L (ref 22–32)
Calcium: 9.4 mg/dL (ref 8.9–10.3)
Chloride: 106 mmol/L (ref 98–111)
Creatinine, Ser: 1.46 mg/dL — ABNORMAL HIGH (ref 0.44–1.00)
GFR, Estimated: 39 mL/min — ABNORMAL LOW (ref >60)
Glucose, Bld: 117 mg/dL — ABNORMAL HIGH (ref 70–99)
Potassium: 4.1 mmol/L (ref 3.5–5.1)
Sodium: 141 mmol/L (ref 135–145)

## 2021-12-20 LAB — MRSA NEXT GEN BY PCR, NASAL: MRSA by PCR Next Gen: NOT DETECTED

## 2021-12-20 LAB — GLUCOSE, CAPILLARY
Glucose-Capillary: 143 mg/dL — ABNORMAL HIGH (ref 70–99)
Glucose-Capillary: 90 mg/dL (ref 70–99)
Glucose-Capillary: 93 mg/dL (ref 70–99)
Glucose-Capillary: 139 mg/dL — ABNORMAL HIGH (ref 70–99)
Glucose-Capillary: 97 mg/dL (ref 70–99)

## 2021-12-20 LAB — LACTIC ACID, PLASMA
Lactic Acid, Venous: 0.6 mmol/L (ref 0.5–1.9)
Lactic Acid, Venous: 0.6 mmol/L (ref 0.5–1.9)

## 2021-12-20 SURGERY — IRRIGATION AND DEBRIDEMENT EXTREMITY
Anesthesia: Monitor Anesthesia Care | Site: Arm Upper | Laterality: Right

## 2021-12-20 MED ORDER — CHLORHEXIDINE GLUCONATE 0.12 % MT SOLN
15.0000 mL | Freq: Two times a day (BID) | OROMUCOSAL | Status: DC
  Administered 2021-12-20 – 2022-01-14 (×47): 15 mL via OROMUCOSAL
  Filled 2021-12-20 (×46): qty 15

## 2021-12-20 MED ORDER — DOCUSATE SODIUM 50 MG/5ML PO LIQD: 100.0000 mg | Freq: Two times a day (BID) | ORAL | Status: DC

## 2021-12-20 MED ORDER — MIDAZOLAM HCL 2 MG/2ML IJ SOLN
INTRAMUSCULAR | Status: AC
Start: 2021-12-20 — End: 2021-12-21
  Filled 2021-12-20: qty 2

## 2021-12-20 MED ORDER — ORAL CARE MOUTH RINSE
15.0000 mL | Freq: Two times a day (BID) | OROMUCOSAL | Status: DC
  Administered 2021-12-20 – 2022-01-14 (×42): 15 mL via OROMUCOSAL

## 2021-12-20 MED ORDER — FENTANYL 2500MCG IN NS 250ML (10MCG/ML) PREMIX INFUSION
25.0000 ug/h | INTRAVENOUS | Status: DC
  Administered 2021-12-20: 25 ug/h via INTRAVENOUS
  Administered 2021-12-22: 75 ug/h via INTRAVENOUS
  Filled 2021-12-20 (×2): qty 250

## 2021-12-20 MED ORDER — PROPOFOL 1000 MG/100ML IV EMUL
INTRAVENOUS | Status: AC
  Filled 2021-12-20: qty 100

## 2021-12-20 MED ORDER — PROPOFOL 500 MG/50ML IV EMUL
INTRAVENOUS | Status: DC | PRN
  Administered 2021-12-20: 30 mg via INTRAVENOUS
  Administered 2021-12-20: 50 mg via INTRAVENOUS

## 2021-12-20 MED ORDER — FENTANYL CITRATE PF 50 MCG/ML IJ SOSY
PREFILLED_SYRINGE | INTRAMUSCULAR | Status: AC
Start: 2021-12-20 — End: 2021-12-21
  Filled 2021-12-20: qty 2

## 2021-12-20 MED ORDER — ETOMIDATE 2 MG/ML IV SOLN
INTRAVENOUS | Status: AC
  Filled 2021-12-20: qty 20

## 2021-12-20 MED ORDER — FLUCONAZOLE IN SODIUM CHLORIDE 400-0.9 MG/200ML-% IV SOLN
400.0000 mg | Freq: Once | INTRAVENOUS | Status: DC
  Filled 2021-12-20: qty 200

## 2021-12-20 MED ORDER — PHENYLEPHRINE 80 MCG/ML (10ML) SYRINGE FOR IV PUSH (FOR BLOOD PRESSURE SUPPORT)
PREFILLED_SYRINGE | INTRAVENOUS | Status: AC
  Filled 2021-12-20: qty 10

## 2021-12-20 MED ORDER — POLYETHYLENE GLYCOL 3350 17 G PO PACK
17.0000 g | PACK | Freq: Every day | ORAL | Status: DC
  Administered 2021-12-20 – 2021-12-22 (×3): 17 g
  Filled 2021-12-20 (×3): qty 1

## 2021-12-20 MED ORDER — MIDAZOLAM HCL 2 MG/2ML IJ SOLN
INTRAMUSCULAR | Status: AC
  Administered 2021-12-20: 4 mg
  Filled 2021-12-20: qty 2

## 2021-12-20 MED ORDER — CHLORHEXIDINE GLUCONATE 4 % EX LIQD: 60.0000 mL | Freq: Once | CUTANEOUS | Status: DC

## 2021-12-20 MED ORDER — REVEFENACIN 175 MCG/3ML IN SOLN
175.0000 ug | Freq: Every day | RESPIRATORY_TRACT | Status: DC
  Administered 2021-12-21 – 2022-01-13 (×24): 175 ug via RESPIRATORY_TRACT
  Filled 2021-12-20 (×26): qty 3

## 2021-12-20 MED ORDER — KETAMINE HCL 50 MG/5ML IJ SOSY
PREFILLED_SYRINGE | INTRAMUSCULAR | Status: AC
  Filled 2021-12-20: qty 5

## 2021-12-20 MED ORDER — PROPOFOL 1000 MG/100ML IV EMUL
0.0000 ug/kg/min | INTRAVENOUS | Status: DC
  Administered 2021-12-20: 5 ug/kg/min via INTRAVENOUS
  Administered 2021-12-20: 50 ug/kg/min via INTRAVENOUS
  Administered 2021-12-21: 20 ug/kg/min via INTRAVENOUS
  Administered 2021-12-21: 30 ug/kg/min via INTRAVENOUS
  Administered 2021-12-21: 40 ug/kg/min via INTRAVENOUS
  Administered 2021-12-21 – 2021-12-22 (×2): 20 ug/kg/min via INTRAVENOUS
  Filled 2021-12-20 (×6): qty 100

## 2021-12-20 MED ORDER — FENTANYL CITRATE (PF) 100 MCG/2ML IJ SOLN
INTRAMUSCULAR | Status: AC
  Filled 2021-12-20: qty 2

## 2021-12-20 MED ORDER — DILTIAZEM 12 MG/ML ORAL SUSPENSION
60.0000 mg | Freq: Four times a day (QID) | ORAL | Status: DC
  Administered 2021-12-20 – 2021-12-22 (×5): 60 mg
  Filled 2021-12-20 (×8): qty 6

## 2021-12-20 MED ORDER — FENTANYL CITRATE PF 50 MCG/ML IJ SOSY
25.0000 ug | PREFILLED_SYRINGE | INTRAMUSCULAR | Status: DC | PRN
  Administered 2021-12-20: 50 ug via INTRAVENOUS
  Administered 2021-12-21: 100 ug via INTRAVENOUS

## 2021-12-20 MED ORDER — DOCUSATE SODIUM 50 MG/5ML PO LIQD
100.0000 mg | Freq: Two times a day (BID) | ORAL | Status: DC
  Administered 2021-12-20 – 2021-12-22 (×4): 100 mg
  Filled 2021-12-20 (×4): qty 10

## 2021-12-20 MED ORDER — ORAL CARE MOUTH RINSE
15.0000 mL | OROMUCOSAL | Status: DC
  Administered 2021-12-20 – 2021-12-21 (×6): 15 mL via OROMUCOSAL

## 2021-12-20 MED ORDER — POVIDONE-IODINE 10 % EX SWAB: 2.0000 "application " | Freq: Once | CUTANEOUS | Status: DC

## 2021-12-20 MED ORDER — ROCURONIUM BROMIDE 10 MG/ML (PF) SYRINGE
PREFILLED_SYRINGE | INTRAVENOUS | Status: AC
  Administered 2021-12-20: 100 mg
  Filled 2021-12-20: qty 10

## 2021-12-20 MED ORDER — ARFORMOTEROL TARTRATE 15 MCG/2ML IN NEBU
15.0000 ug | INHALATION_SOLUTION | Freq: Two times a day (BID) | RESPIRATORY_TRACT | Status: DC
  Administered 2021-12-20 – 2022-01-13 (×46): 15 ug via RESPIRATORY_TRACT
  Filled 2021-12-20 (×51): qty 2

## 2021-12-20 MED ORDER — PROPOFOL 500 MG/50ML IV EMUL
INTRAVENOUS | Status: DC | PRN
  Administered 2021-12-20: 50 ug/kg/min via INTRAVENOUS

## 2021-12-20 MED ORDER — POLYETHYLENE GLYCOL 3350 17 G PO PACK: 17.0000 g | PACK | Freq: Every day | ORAL | Status: DC

## 2021-12-20 MED ORDER — HYDRALAZINE HCL 20 MG/ML IJ SOLN
10.0000 mg | Freq: Four times a day (QID) | INTRAMUSCULAR | Status: DC | PRN
  Administered 2021-12-20 – 2021-12-30 (×13): 10 mg via INTRAVENOUS
  Filled 2021-12-20 (×13): qty 1

## 2021-12-20 MED ORDER — ROPIVACAINE HCL 5 MG/ML IJ SOLN
INTRAMUSCULAR | Status: DC | PRN
  Administered 2021-12-20: 30 mL via PERINEURAL

## 2021-12-20 MED ORDER — LACTATED RINGERS IV SOLN: INTRAVENOUS | Status: DC | PRN

## 2021-12-20 MED ORDER — DEXMEDETOMIDINE (PRECEDEX) IN NS 20 MCG/5ML (4 MCG/ML) IV SYRINGE
PREFILLED_SYRINGE | INTRAVENOUS | Status: DC | PRN
  Administered 2021-12-20: 20 ug via INTRAVENOUS

## 2021-12-20 MED ORDER — SUCCINYLCHOLINE CHLORIDE 200 MG/10ML IV SOSY
PREFILLED_SYRINGE | INTRAVENOUS | Status: AC
  Filled 2021-12-20: qty 10

## 2021-12-20 MED ORDER — CHLORHEXIDINE GLUCONATE 0.12% ORAL RINSE (MEDLINE KIT): 15.0000 mL | Freq: Two times a day (BID) | OROMUCOSAL | Status: DC

## 2021-12-20 MED ORDER — FLUCONAZOLE IN SODIUM CHLORIDE 400-0.9 MG/200ML-% IV SOLN
400.0000 mg | Freq: Once | INTRAVENOUS | Status: AC
  Administered 2021-12-20: 400 mg via INTRAVENOUS
  Filled 2021-12-20: qty 200

## 2021-12-20 MED ORDER — BUDESONIDE 0.5 MG/2ML IN SUSP
0.5000 mg | Freq: Two times a day (BID) | RESPIRATORY_TRACT | Status: DC
  Administered 2021-12-20 – 2022-01-13 (×48): 0.5 mg via RESPIRATORY_TRACT
  Filled 2021-12-20 (×50): qty 2

## 2021-12-20 MED ORDER — 0.9 % SODIUM CHLORIDE (POUR BTL) OPTIME
TOPICAL | Status: DC | PRN
  Administered 2021-12-20: 2000 mL

## 2021-12-20 MED ORDER — METHYLPREDNISOLONE SODIUM SUCC 40 MG IJ SOLR
40.0000 mg | Freq: Every day | INTRAMUSCULAR | Status: DC
  Administered 2021-12-20 – 2021-12-24 (×5): 40 mg via INTRAVENOUS
  Filled 2021-12-20 (×6): qty 1

## 2021-12-20 MED ORDER — METHYLPREDNISOLONE SODIUM SUCC 40 MG IJ SOLR: 40.0000 mg | Freq: Two times a day (BID) | INTRAMUSCULAR | Status: DC

## 2021-12-20 MED ORDER — FENTANYL CITRATE PF 50 MCG/ML IJ SOSY
25.0000 ug | PREFILLED_SYRINGE | INTRAMUSCULAR | Status: DC | PRN
  Administered 2021-12-20: 25 ug via INTRAVENOUS
  Filled 2021-12-20: qty 1

## 2021-12-20 MED ORDER — DILTIAZEM HCL 60 MG PO TABS: 60.0000 mg | ORAL_TABLET | Freq: Four times a day (QID) | ORAL | Status: DC

## 2021-12-20 MED ORDER — FENTANYL CITRATE PF 50 MCG/ML IJ SOSY: 25.0000 ug | PREFILLED_SYRINGE | Freq: Once | INTRAMUSCULAR | Status: AC

## 2021-12-20 MED ORDER — FLUCONAZOLE IN SODIUM CHLORIDE 400-0.9 MG/200ML-% IV SOLN
400.0000 mg | INTRAVENOUS | Status: DC
  Administered 2021-12-20 – 2021-12-28 (×8): 400 mg via INTRAVENOUS
  Filled 2021-12-20 (×10): qty 200

## 2021-12-20 MED ORDER — ACETAMINOPHEN 10 MG/ML IV SOLN: 1000.0000 mg | Freq: Once | INTRAVENOUS | Status: DC | PRN

## 2021-12-20 MED ORDER — DEXMEDETOMIDINE (PRECEDEX) IN NS 20 MCG/5ML (4 MCG/ML) IV SYRINGE
PREFILLED_SYRINGE | INTRAVENOUS | Status: AC
  Filled 2021-12-20: qty 5

## 2021-12-20 MED ORDER — SODIUM CHLORIDE 0.9 % IR SOLN
Status: DC | PRN
  Administered 2021-12-20: 3000 mL

## 2021-12-20 MED ORDER — FENTANYL CITRATE PF 50 MCG/ML IJ SOSY: 25.0000 ug | PREFILLED_SYRINGE | INTRAMUSCULAR | Status: DC | PRN

## 2021-12-20 SURGICAL SUPPLY — 43 items
BAG COUNTER SPONGE SURGICOUNT (BAG) IMPLANT
BAG SPEC THK2 15X12 ZIP CLS (MISCELLANEOUS) ×1
BAG SPNG CNTER NS LX DISP (BAG)
BAG ZIPLOCK 12X15 (MISCELLANEOUS) ×2 IMPLANT
BLADE SURG SZ10 CARB STEEL (BLADE) ×2 IMPLANT
BNDG CMPR 9X4 STRL LF SNTH (GAUZE/BANDAGES/DRESSINGS) ×1
BNDG ELASTIC 4X5.8 VLCR STR LF (GAUZE/BANDAGES/DRESSINGS) ×2 IMPLANT
BNDG ESMARK 4X9 LF (GAUZE/BANDAGES/DRESSINGS) ×2 IMPLANT
BNDG GAUZE ELAST 4 BULKY (GAUZE/BANDAGES/DRESSINGS) ×2 IMPLANT
COVER MAYO STAND STRL (DRAPES) ×2 IMPLANT
COVER SURGICAL LIGHT HANDLE (MISCELLANEOUS) ×2 IMPLANT
CUFF TOURN SGL QUICK 18X4 (TOURNIQUET CUFF) ×2 IMPLANT
DRAIN PENROSE 0.5X18 (DRAIN) ×2 IMPLANT
DRAPE SURG 17X11 SM STRL (DRAPES) ×4 IMPLANT
DRSG EMULSION OIL 3X3 NADH (GAUZE/BANDAGES/DRESSINGS) ×2 IMPLANT
DRSG PAD ABDOMINAL 8X10 ST (GAUZE/BANDAGES/DRESSINGS) ×5 IMPLANT
ELECT REM PT RETURN 15FT ADLT (MISCELLANEOUS) ×2 IMPLANT
GAUZE SPONGE 4X4 12PLY STRL (GAUZE/BANDAGES/DRESSINGS) ×2 IMPLANT
GAUZE XEROFORM 5X9 LF (GAUZE/BANDAGES/DRESSINGS) ×5 IMPLANT
GLOVE SURG ORTHO 8.0 STRL STRW (GLOVE) ×2 IMPLANT
GOWN STRL REUS W/ TWL XL LVL3 (GOWN DISPOSABLE) ×1 IMPLANT
GOWN STRL REUS W/TWL XL LVL3 (GOWN DISPOSABLE) ×2
IV LACTATED RINGER IRRG 3000ML (IV SOLUTION) ×2
IV LR IRRIG 3000ML ARTHROMATIC (IV SOLUTION) ×1 IMPLANT
KIT BASIN OR (CUSTOM PROCEDURE TRAY) ×2 IMPLANT
KIT TURNOVER KIT A (KITS) IMPLANT
MANIFOLD NEPTUNE II (INSTRUMENTS) ×2 IMPLANT
PACK ORTHO EXTREMITY (CUSTOM PROCEDURE TRAY) ×2 IMPLANT
PAD CAST 4YDX4 CTTN HI CHSV (CAST SUPPLIES) ×1 IMPLANT
PADDING CAST COTTON 4X4 STRL (CAST SUPPLIES) ×2
PENCIL SMOKE EVACUATOR (MISCELLANEOUS) IMPLANT
PROTECTOR NERVE ULNAR (MISCELLANEOUS) ×2 IMPLANT
SOL PREP POV-IOD 4OZ 10% (MISCELLANEOUS) ×2 IMPLANT
SOL SCRUB PVP POV-IOD 4OZ 7.5% (MISCELLANEOUS) ×2
SOLUTION SCRB POV-IOD 4OZ 7.5% (MISCELLANEOUS) ×1 IMPLANT
SUT PROLENE 3 0 PS 2 (SUTURE) ×2 IMPLANT
SUT VIC AB 1 CT1 27 (SUTURE) ×2
SUT VIC AB 1 CT1 27XBRD ANTBC (SUTURE) ×1 IMPLANT
SUT VIC AB 2-0 CT1 27 (SUTURE) ×2
SUT VIC AB 2-0 CT1 27XBRD (SUTURE) ×1 IMPLANT
SYR 20ML LL LF (SYRINGE) ×2 IMPLANT
SYR CONTROL 10ML LL (SYRINGE) ×2 IMPLANT
TOWEL OR 17X26 10 PK STRL BLUE (TOWEL DISPOSABLE) ×2 IMPLANT

## 2021-12-20 NOTE — Consult Note (Signed)
? ?NAME:  Shannon COLETTA, MRN:  [id number], DOB:  10-Nov-1955, LOS: 3 ?ADMISSION DATE:  12/17/2021, CONSULTATION DATE:  12/20/2021 ?REFERRING MD:  Darliss Cheney, MD CHIEF COMPLAINT:  respiratory failure  ? ?History of Present Illness:  ?Shannon [name] is a 66 year old woman with metastatic non-small cell lung cancer in 2021 with recurrence in 09/2021, asthma/COPD, GERD, hypertension and HIV who was admitted 12/17/21 from [facility] due to right lower extremity wound which had been there over the previous month.  She had irrigation and debridement of right foot and calf wounds on 12/17/21 with cultures growing klebsiella. Blood cultures from 4/26 grew candida parapsilosis and she was started on micafungin 4/28 which was changed to fluconazole 4/29.  On 4/28 she developed swelling of her right upper extremity and was evaluated by orthopedic surgery for concern of nectrotizing fasciitis or compartment syndrome so she went for incision and drainage morning of 4/29 without overt signs of necrotizing fasciitis. She developed progressive mental status changes 4/28 to 4/29 with obtundation. She was transferred to the ICU and PCCM consulted for hypercapnic respiratory failure.  ? ?Unable to obtain further history from patient due to obtundation. I spoke with patient's niece - Shannon [name] in person and patient's sister Shannon [name] over the phone. Shannon [name] consented to move forward with intubation.  ? ?Pertinent  Medical History  ?HIV ?Asthma-COPD ?GERD ?Hypertension ?Obesity ? ?Significant Hospital Events: ?Including procedures, antibiotic start and stop dates in addition to other pertinent events   ?4/26: admitted, I&D of LLE ?4/28: started on bipap, altered mentation ?4/29: I&D of RUE, transferred to ICU and intubated ? ?Interim History / Subjective:  ?As above ? ?Objective   ?Blood pressure (!) 166/78, pulse 73, temperature (!) 97.1 ?F (36.2 ?C), temperature source Axillary, resp. rate (!) 21, height 5\' 5"  (1.651 m), weight 107.1 kg, SpO2  99 %. ?   ?FiO2 (%):  [30 %-50 %] 40 %  ? ?Intake/Output Summary (Last 24 hours) at 12/20/2021 1537 ?Last data filed at 12/20/2021 1212 ?Gross per 24 hour  ?Intake 2337.73 ml  ?Output 1375 ml  ?Net 962.73 ml  ? ?Filed Weights  ? 12/18/21 0459 12/19/21 0354 12/20/21 0435  ?Weight: 101.4 kg 105.3 kg 107.1 kg  ? ? ?Examination: ?General: obese woman, critically ill appearing, obtunded ?HENT: [place]/AT, dry mucous membranes, sclera anicteric ?Lungs: poor air movement, no wheezing. ?Cardiovascular: rrr, no murmurs ?Abdomen: soft, non-tender, non-distended, bowel sounds present ?Extremities: RUE and LLE dressings in place, warm ?Neuro: arousable to painful stimuli, not following commands ?GU: n/a ? ?Resolved Hospital Problem list   ? ? ?Assessment & Plan:  ?Acute on Chronic Hypercapnic Respiratory Failure ?Acute Exacerbation of COPD ?Metastatic Adenocarcinoma of the lung to bone ?In setting of sepsis due to listed infections below ?- Continue mechanical ventilation ?- PAD protocol ordered, propofol and PRN fentanyl ?- ICS/LABA/LAMA nebs, PRN albuterol nebs ?- IV solumedrol 40mg  daily ? ?Sepsis due to candida parasipsilosis bacteremia and Klebsiella soft tissue infection of Left lower extremity ?- Fluconazole, linezolid and Zosyn per ID ?- Follow up blood cultures and wound cultures ? ?Acute Metabolic and hypercapnic Encephalopathy ?- Due to the above issues, plans as above ? ?Acute Kidney Injury on CKD IIIa ?- Will provide fluids as needed ? ?HFpEF ?Atrial Flutter ?- Diltiazem for rate control ?- xarelto on hold for surgical interventions ?- Lovenox therapeutic dose ? ?Anemia, Acute  ?Thrombocytopenia, Acute  ?- monitor ?- transfuse for hemoglobin < 7g/dL ? ?HIV ?- continue biktarvy ?- ID following ? ?DMII ?-  q4hr blood sugars ?- SSI  ?- may need to adjust with adding steroids ? ?Best Practice (right click and "Reselect all SmartList Selections" daily)  ? ?Diet/type: tubefeeds ?DVT prophylaxis: LMWH ?GI prophylaxis: H2B ?Lines:  Central line ?Foley:  N/A ?Code Status:  full code ?Last date of multidisciplinary goals of care discussion [4/29 discussed with patient's sister [name]] ? ?Labs   ?CBC: ?Recent Labs  ?Lab 12/17/21 ?3976 12/18/21 ?7341 12/19/21 ?9379 12/20/21 ?0458  ?WBC 11.5* 10.4 8.4 6.3  ?NEUTROABS 10.3*  --  7.4 5.6  ?HGB 12.5 12.2 10.8* 9.7*  ?HCT 39.1 39.2 35.2* 31.9*  ?MCV 98.2 104.0* 107.6* 106.7*  ?PLT 191 163 146* 129*  ? ? ?Basic Metabolic Panel: ?Recent Labs  ?Lab 12/17/21 ?0240 12/18/21 ?9735 12/19/21 ?3299 12/20/21 ?0458  ?NA 139 142 140 141  ?K 5.3* 5.0 4.8 4.1  ?CL 99 104 106 106  ?CO2 34* 32 29 31  ?GLUCOSE 241* 124* 123* 117*  ?BUN 46* 42* 34* 29*  ?CREATININE 1.86* 1.58* 1.60* 1.46*  ?CALCIUM 10.8* 10.3 9.6 9.4  ?MG  --  2.2  --   --   ? ?GFR: ?Estimated Creatinine Clearance: 45.5 mL/min (A) (by C-G formula based on SCr of 1.46 mg/dL (H)). ?Recent Labs  ?Lab 12/17/21 ?2426 12/17/21 ?1120 12/17/21 ?2102 12/18/21 ?8341 12/19/21 ?0346 12/20/21 ?0458 12/20/21 ?1022 12/20/21 ?1510  ?WBC 11.5*  --   --  10.4 8.4 6.3  --   --   ?LATICACIDVEN  --  2.2* 1.5  --   --   --  0.6 0.6  ? ? ?Liver Function Tests: ?Recent Labs  ?Lab 12/17/21 ?9622 12/18/21 ?0323  ?AST 16 18  ?ALT 41 38  ?ALKPHOS 84 73  ?BILITOT 0.4 0.4  ?PROT 6.5 6.0*  ?ALBUMIN 3.4* 2.9*  ? ?No results for input(s): LIPASE, AMYLASE in the last 168 hours. ?No results for input(s): AMMONIA in the last 168 hours. ? ?ABG ?   ?Component Value Date/Time  ? PHART 7.35 12/20/2021 0850  ? PCO2ART 62 (H) 12/20/2021 0850  ? PO2ART 72 (L) 12/20/2021 0850  ? HCO3 34.2 (H) 12/20/2021 0850  ? TCO2 30 09/18/2016 1722  ? O2SAT 96.7 12/20/2021 0850  ?  ? ?Coagulation Profile: ?No results for input(s): INR, PROTIME in the last 168 hours. ? ?Cardiac Enzymes: ?No results for input(s): CKTOTAL, CKMB, CKMBINDEX, TROPONINI in the last 168 hours. ? ?HbA1C: ?Hgb A1c MFr Bld  ?Date/Time Value Ref Range Status  ?12/17/2021 09:02 PM 7.3 (H) 4.8 - 5.6 % Final  ?  Comment:  ?  (NOTE) ?Pre  diabetes:          5.7%-6.4% ? ?Diabetes:              >6.4% ? ?Glycemic control for   <7.0% ?adults with diabetes ?  ?09/07/2019 02:41 PM 5.7 (H) <5.7 % of total Hgb Final  ?  Comment:  ?  For someone without known diabetes, a hemoglobin  ?A1c value between 5.7% and 6.4% is consistent with ?prediabetes and should be confirmed with a  ?follow-up test. ?. ?For someone with known diabetes, a value <7% ?indicates that their diabetes is well controlled. A1c ?targets should be individualized based on duration of ?diabetes, age, comorbid conditions, and other ?considerations. ?. ?This assay result is consistent with an increased risk ?of diabetes. ?. ?Currently, no consensus exists regarding use of ?hemoglobin A1c for diagnosis of diabetes for children. ?. ?  ? ? ?CBG: ?Recent Labs  ?Lab 12/19/21 ?2354 12/20/21 ?  0403 12/20/21 ?0731 12/20/21 ?1248 12/20/21 ?1524  ?GLUCAP 90 143* 90 93 139*  ? ? ?Review of Systems:   ?Unable to perform ROS due to mental status ? ?Past Medical History:  ?She,  has a past medical history of Anemia, Anxiety, Arthritis, Asthma, Atrial fibrillation ([place]), Atrial flutter, paroxysmal ([place]), Bloated abdomen, CFS (chronic fatigue syndrome), Chewing difficulty, Chronic asthma with acute exacerbation, Chronic lower back pain, COPD (chronic obstructive pulmonary disease) ([place]), Cyst of right kidney, Dyspnea, GERD (gastroesophageal reflux disease), Heart murmur, History of blood transfusion, History of pulmonary embolism (07/09/2015), History of radiation therapy (04/09/20-04/22/20), History of radiation therapy, HIV disease ([place]), Hyperlipidemia, Hypertension, Leg edema, Lipodystrophy, Mild CAD (2013), Multiple thyroid nodules, Osteopenia, Palpitations, Pneumonia (07/09/2015), Shingles, Vitamin B 12 deficiency, and Vitamin D deficiency.  ? ?Surgical History:  ? ?Past Surgical History:  ?Procedure Laterality Date  ? ABDOMINAL HYSTERECTOMY    ? "robotic laparosopic"  ? BRAIN SURGERY  1974  ? "brain  tumor; benign; on top of my brain; got a plate in there"  ? BRAIN SURGERY    ? age 59- -"Tumor pushing my skullout"  ? BRONCHIAL BIOPSY  02/13/2020  ? Procedure: BRONCHIAL BIOPSIES;  Surgeon: [name]

## 2021-12-20 NOTE — Progress Notes (Signed)
Orthopedic Tech Progress Note ?Patient Details:  ?Shannon [name] ?August 11, 1956 ?[id number] ? ?Ortho Devices ?Type of Ortho Device: CAM walker ?Ortho Device/Splint Location: right ?  ?Post Interventions ?Patient Tolerated: Well ?Instructions Provided: Care of device ? ?[place] Pink ?12/20/2021, 12:41 PM ? ?

## 2021-12-20 NOTE — Progress Notes (Signed)
The patient's chart were reviewed.  Discussed the case with colleagues.  Given the immunocompromise in the right upper extremity predicament I do think there is value of opening up the right arm and seeing if there is infection or whether or not this is related to the infiltrate.  Plan for surgery later this morning.  The CT scan was reviewed which does show the focal area does not appear to be diffuse within the arm and lower arm.  Orders placed ?

## 2021-12-20 NOTE — Progress Notes (Signed)
?  [facility]   ? ?Date of Admission:  12/17/2021    ? ?ID: Shannon [name] is a 66 y.o. female with  Principal Problem: ?  Abscess of foot ?Active Problems: ?  HIV disease ([place]) ?  Uncontrolled hypertension ?  Morbid (severe) obesity due to excess calories ([place]) ?  Atrial flutter ([place]) ?  Chronic anticoagulation ?  (HFpEF) heart failure with preserved ejection fraction ([place]) ?  Adenocarcinoma of lung ([place]) ?  Abscess of right foot ?  Necrotizing fasciitis ([place]) ? ? ? ?Subjective: ?Eventful 24hrs: blood cx + c.parapsilosis yesterday and started on micafungin.  Right upper extremity blistering was concerning for necrotizing fasciitis thus she underwent RUE fasciatomy with tissue samples sent for culture. Over the course of the past 24hrs, increasing solomnent and lethargy. Abg showing pH of 7.29 and pCO2 at 61 necessitating bipap and transfer to ICU for management of respirator distress with plans for intubation. She remains afebrile. ? ?Medications:  ? (feeding supplement) PROSource Plus  30 mL Oral BID BM  ? vitamin C  500 mg Oral BID  ? bictegravir-emtricitabine-tenofovir AF  1 tablet Oral Daily  ? chlorhexidine  15 mL Mouth Rinse BID  ? Chlorhexidine Gluconate Cloth  6 each Topical Daily  ? diltiazem  240 mg Oral Daily  ? docusate sodium  100 mg Oral BID  ? enoxaparin (LOVENOX) injection  100 mg Subcutaneous Q12H  ? etomidate      ? famotidine  40 mg Oral Daily  ? feeding supplement  237 mL Oral BID BM  ? fentaNYL      ? fentaNYL      ? insulin aspart  0-15 Units Subcutaneous Q4H  ? ketamine HCl      ? mouth rinse  15 mL Mouth Rinse q12n4p  ? melatonin  3 mg Oral QHS  ? midazolam      ? midazolam      ? multivitamin with minerals  1 tablet Oral Daily  ? phenylephrine      ? QUEtiapine  25 mg Oral QHS  ? rocuronium bromide      ? sodium chloride flush  10-40 mL Intracatheter Q12H  ? spironolactone  25 mg Oral Daily  ? succinylcholine      ? zinc sulfate  220 mg Oral Daily   ? ? ?Objective: ?Vital signs in last 24 hours: ?Temp:  [97.1 ?F (36.2 ?C)-98.3 ?F (36.8 ?C)] 97.1 ?F (36.2 ?C) (04/29 1516) ?Pulse Rate:  [73-92] 77 (04/29 1615) ?Resp:  [11-27] 11 (04/29 1615) ?BP: (107-195)/(64-94) 192/84 (04/29 1615) ?SpO2:  [90 %-100 %] 100 % (04/29 1615) ?FiO2 (%):  [30 %-50 %] 40 % (04/29 1516) ?Weight:  [107.1 kg] 107.1 kg (04/29 0435) ? ? ?Physical Exam  ?Constitutional:  wearing bipap. appears well-developed and well-nourished. No distress.  ?HENT: [place]/AT, PERRLA, no scleral icterus ?Mouth/Throat: Oropharynx is clear and moist. No oropharyngeal exudate.  ?Cardiovascular: tachy, regular rhythm and normal heart sounds. Exam reveals no gallop and no friction rub.  ?No murmur heard.  ?Pulmonary/Chest: Effort normal and breath sounds normal. No respiratory distress. wheezes.  ?Abdominal: Soft. Bowel sounds are decreased  exhibits no distension. There is no tenderness.  ?Lymphadenopathy: no cervical adenopathy. No axillary adenopathy ?Skin: right upper extremity is wrapped from surgery, right leg and foot also wrapped from I x D ? ? ?Lab Results ?Recent Labs  ?  12/19/21 ?0346 12/20/21 ?0458  ?WBC 8.4 6.3  ?HGB 10.8* 9.7*  ?HCT  35.2* 31.9*  ?NA 140 141  ?K 4.8 4.1  ?CL 106 106  ?CO2 29 31  ?BUN 34* 29*  ?CREATININE 1.60* 1.46*  ? ?Liver Panel ?Recent Labs  ?  12/18/21 ?0323  ?PROT 6.0*  ?ALBUMIN 2.9*  ?AST 18  ?ALT 38  ?ALKPHOS 73  ?BILITOT 0.4  ? ?Sedimentation Rate ?Recent Labs  ?  12/17/21 ?2102  ?ESRSEDRATE 15  ? ?C-Reactive Protein ?Recent Labs  ?  12/17/21 ?2102  ?CRP 8.0*  ? ? ?Microbiology: ?4/29 arm cx = pending ?4/26 foot cx = kleb pneumonaie ( cx still pending) ?4/26 blood cx = 1 of 4 bottles c.parapsilosis ? ?Studies/Results: ?CT FOREARM RIGHT WO CONTRAST ? ?Result Date: 12/19/2021 ?CLINICAL DATA:  Soft tissue infection suspected. History of IV infiltration. EXAM: CT OF THE RIGHT FOREARM WITHOUT CONTRAST TECHNIQUE: Multidetector CT imaging was performed according to the standard  protocol. Multiplanar CT image reconstructions were also generated. RADIATION DOSE REDUCTION: This exam was performed according to the departmental dose-optimization program which includes automated exposure control, adjustment of the mA and/or kV according to patient size and/or use of iterative reconstruction technique. COMPARISON:  None. FINDINGS: Bones/Joint/Cartilage Bones are normal in density. No evidence of fracture or osteonecrosis. Evaluation of wrist bones is limited due to motion artifact. Ligaments Suboptimally assessed by CT. Muscles and Tendons Flexor and extensor muscles of the forearm or normal in bulk. No intramuscular fluid collection or abscess. Soft tissues There is marked skin thickening and subcutaneous soft tissue edema. There are multiple pockets of gas in the deep and superficial subcutaneous soft tissues of the anterior aspect of the forearm. The most prominent deep collection adjacent to the common extensor muscles measures approximately 0.8 x 3.2 by 3.4 cm. Multiple other 1-2 cm pockets of gas with marked surrounding edema highly suspicious for necrotizing fasciitis. There are multiple skin blisters over the anterior aspect of the forearm measuring 2-3 cm. There is also marked edema and skin thickening about the extensor aspect of the forearm without evidence of gas or fluid collection. IMPRESSION: 1. Skin thickening and multiple pockets of superficial and deep subcutaneous gas in the anterior aspect of the proximal forearm with marked surrounding edema highly suspicious for necrotizing fasciitis. Correlate with physical examination findings. Follow-up examination with CT with contrast is recommended.\ 2. Multiple skin blisters about the anterior aspect of the forearm oral noted. 3. Muscles are normal in bulk. No intramuscular fluid collection or abscess. 4.  Osseous structures are unremarkable. Above findings were discussed with orthopedic team on-call. Electronically Signed   By: Keane Police D.O.   On: 12/19/2021 23:04  ? ?DG CHEST PORT 1 VIEW ? ?Result Date: 12/20/2021 ?CLINICAL DATA:  Hypoxia EXAM: PORTABLE CHEST 1 VIEW COMPARISON:  12/19/2021 FINDINGS: Interval placement of left upper extremity PICC line with distal tip terminating at the level of the distal SVC. Stable heart size. Unchanged lingular and left basilar opacity with adjacent fiduciary markers. Right lung is clear. No pneumothorax. IMPRESSION: New left upper extremity PICC line.  Otherwise stable chest. Electronically Signed   By: Davina Poke D.O.   On: 12/20/2021 15:56  ? ?DG CHEST PORT 1 VIEW ? ?Result Date: 12/19/2021 ?CLINICAL DATA:  Hypoxia EXAM: PORTABLE CHEST 1 VIEW COMPARISON:  Chest two views 12/17/2021, 12/02/2021, 11/10/2021; CT chest 12/02/2021 FINDINGS: Cardiac silhouette is again moderately enlarged. Mediastinal contours are unchanged. Mildly decreased lung volumes similar to prior.The right lung is clear. There is again scarring within the lingula in the region of 3 surgical  clips. No new focal airspace opacity. No definite pleural effusion. No pneumothorax. No acute skeletal abnormality. Bilateral posterior rib expansile lesions are again noted remaining concerning for osseous metastases. Note is made the patient has a history of recurrent left perihilar lung cancer. IMPRESSION: No significant change in left lingular scarring adjacent to fiducial markers in the region of left perihilar recurrent lung cancer. No acute lung process is visualized. Electronically Signed   By: Yvonne Kendall M.D.   On: 12/19/2021 14:06  ? ?VAS [place] UPPER EXTREMITY VENOUS DUPLEX ? ?Result Date: 12/19/2021 ?UPPER VENOUS STUDY  Patient Name:  RIKO LUMSDEN  Date of Exam:   12/19/2021 Medical Rec #: [id number]       Accession #:    [id number] Date of Birth: [birth year]       Patient Gender: F Patient Age:   27 years Exam Location:  [facility] Procedure:      VAS [place] UPPER EXTREMITY VENOUS DUPLEX Referring Phys: MICHAEL JEFFERY  --------------------------------------------------------------------------------  Other Indications: Diffuse edema and blistering to RUE - previous IV placement. Limitations: Body habitus, poor ultrasound/tissue interface, patie

## 2021-12-20 NOTE — Anesthesia Procedure Notes (Signed)
Anesthesia Regional Block: Supraclavicular block  ? ?Pre-Anesthetic Checklist: , timeout performed,  Correct Patient, Correct Site, Correct Laterality,  Correct Procedure, Correct Position, site marked,  Risks and benefits discussed,  Surgical consent,  Pre-op evaluation,  At surgeon's request and post-op pain management ? ?Laterality: Right ? ?Prep: Dura Prep     ?  ?Needles:  ?Injection technique: Single-shot ? ?Needle Type: Echogenic Stimulator Needle   ? ? ?Needle Length: 5cm  ?Needle Gauge: 20  ? ? ? ?Additional Needles: ? ? ?Procedures:,,,, ultrasound used (permanent image in chart),,    ?Narrative:  ?Start time: 12/20/2021 11:23 AM ?End time: 12/20/2021 11:28 AM ?Injection made incrementally with aspirations every 5 mL. ? ?Performed by: Personally  ?Anesthesiologist: Darral Dash, DO ? ?Additional Notes: ?Patient identified. Risks/Benefits/Options discussed with patient/POA including but not limited to bleeding, infection, nerve damage, failed block, incomplete pain control. Patient/ POA expressed understanding and wished to proceed. All questions were answered. Sterile technique was used throughout the entire procedure. Please see nursing notes for vital signs. Aspirated in 5cc intervals with injection for negative confirmation. Patient/POA was given instructions on fall risk and not to get out of bed. All questions and concerns addressed with instructions to call with any issues or inadequate analgesia.   ?  ? ? ? ? ?

## 2021-12-20 NOTE — Progress Notes (Signed)
PT Cancellation Note ? ?Patient Details ?Name: Shannon [name] ?MRN: [id number] ?DOB: 1956/06/15 ? ? ?Cancelled Treatment:    Reason Eval/Treat Not Completed: Medical issues which prohibited therapy. Nurse and Respiratory in room, with pt on Bi-Pap. Nurse reports patient has been lethargic, not following commands well and may be heading to OR.  Discussed with nursing and PT will check back. ? ? ?Galen Manila ?12/20/2021, 9:27 AM ?

## 2021-12-20 NOTE — Progress Notes (Signed)
OT Cancellation Note ? ?Patient Details ?Name: Shannon [name] ?MRN: [id number] ?DOB: Feb 04, 1956 ? ? ?Cancelled Treatment:    Reason Eval/Treat Not Completed: Medical issues which prohibited therapy ?Patient is lethargic this AM per PT report with potentially transitioning to OR today. OT to continue to follow and check back as schedule will allow.  ?Denya Buckingham OTR/L, MS ?Acute Rehabilitation Department ?Office# [phone] ?Pager# [phone] ? ?12/20/2021, 10:00 AM ?

## 2021-12-20 NOTE — Progress Notes (Signed)
Subjective: ?3 Days Post-Op Procedure(s) (LRB): ?IRRIGATION AND DEBRIDEMENT, RIGHT FOOT AND CALF (Right) ? ?Patient stable on RNF postop. She is drowsy but follows commands and localizes pain. She has new RUE blistering that is in the same region as recent IV line infiltration.  ? ?Objective:  ? ?VITALS:  Temp:  [97.6 ?F (36.4 ?C)-99 ?F (37.2 ?C)] 98.3 ?F (36.8 ?C) (04/29 4128) ?Pulse Rate:  [85-93] 89 (04/29 0442) ?Resp:  [16-20] 20 (04/29 0853) ?BP: (107-136)/(67-81) 108/75 (04/29 0442) ?SpO2:  [94 %-98 %] 95 % (04/29 0853) ?Weight:  [107.1 kg] 107.1 kg (04/29 0435) ? ?Gen: Drowsy, dyspneic ? ?Right Lower Extremity: ?Improving swelling and erythema of leg and foot, no progression to thigh ?No new blistering ?No crepitus ?TTP over surgical sites ?Packing in place at surgical sites with appropriate serosanguineous drainage ?HF/KE/KF/ADF/APF/EHL 5/5 ?SILT throughout ?DP, PT 2+ to palp ?CR < 2s ? ?Left Lower Extremity: ?Improving swelling and erythema of leg, no progression to foot or thigh ?No blistering ?No crepitus ?TTP over medial ankle (stable from yesterday) ?HF/KE/KF/ADF/APF/EHL 5/5 ?SILT throughout ?DP, PT 2+ to palp ?CR < 2s ? ?Gen: AAOx3, NAD ?Comfortable at rest ? ?Right Upper Extremity: ?Serous blistering over forearm ?No TTP ?Radial, Ulnar + to palp ?CR < 2s ? ? ? ? ?LABS ?Recent Labs  ?  12/18/21 ?0323 12/19/21 ?0346 12/20/21 ?0458  ?HGB 12.2 10.8* 9.7*  ?WBC 10.4 8.4 6.3  ?PLT 163 146* 129*  ? ?Recent Labs  ?  12/19/21 ?0346 12/20/21 ?0458  ?NA 140 141  ?K 4.8 4.1  ?CL 106 106  ?CO2 29 31  ?BUN 34* 29*  ?CREATININE 1.60* 1.46*  ?GLUCOSE 123* 117*  ? ?No results for input(s): LABPT, INR in the last 72 hours. ? ? ?Assessment/Plan: ?3 Days Post-Op Procedure(s) (LRB): ?IRRIGATION AND DEBRIDEMENT, RIGHT FOOT AND CALF (Right) ? ?-pt remains critical but stable on RNF, her vitals have remained stable and she is clinically looking improved in BLE ?-Hand consulted directly for new right upper extremity  blistering ?-bedside nursing to do daily packing of right foot and leg wounds with sterile iodoform gauze ?-heel weightbearing right lower extremity in CAM boot when ambulating, maximum elevation ?-VTE prophylaxis per hospitalist team preference, patient has a history of PE and metastatic carcinoma. OK from Ortho standpoint for Lovenox daily or as per primary team discretion given her CKD ?-trend intraoperative cultures ?-broad spectrum IV antibiotics including coverage for gas gangrene/necrotizing infection ?-nutrition consult ?-appreciate primary team care, ID recs, wound care team help ?-will continue to follow patient during admission ?  ? ?Armond Hang ?12/20/2021, 9:46 AM ? ?

## 2021-12-20 NOTE — Procedures (Signed)
Intubation Procedure Note ? ?Shannon [name]  ?[id number]  ?November 26, 1955 ? ?Date:12/20/21  ?Time:4:58 PM  ? ?Provider Performing:Umar Patmon B Ajaya Crutchfield  ? ? ?Procedure: Intubation (64332) ? ?Indication(s) ?Respiratory Failure ? ?Consent ?Risks of the procedure as well as the alternatives and risks of each were explained to the patient and/or caregiver.  Consent for the procedure was obtained and is signed in the bedside chart ? ? ?Anesthesia ?Versed and Rocuronium ? ? ?Time Out ?Verified patient identification, verified procedure, site/side was marked, verified correct patient position, special equipment/implants available, medications/allergies/relevant history reviewed, required imaging and test results available. ? ? ?Sterile Technique ?Usual hand hygeine, masks, and gloves were used ? ? ?Procedure Description ?Patient positioned in bed supine.  Sedation given as noted above.  Patient was intubated with endotracheal tube using Glidescope.  View was Grade 1 full glottis .  Number of attempts was 1.  Colorimetric CO2 detector was consistent with tracheal placement. Airway contained blue pill contents, suctioned as best as possible.  ? ? ?Complications/Tolerance ?None; patient tolerated the procedure well. ?Chest X-ray is ordered to verify placement. ? ? ?EBL ?Minimal ? ? ?Specimen(s) ?None  ?

## 2021-12-20 NOTE — Anesthesia Postprocedure Evaluation (Signed)
Anesthesia Post Note ? ?Patient: Shannon [name] ? ?Procedure(s) Performed: IRRIGATION AND DEBRIDEMENT RT. FOREARM (Right: Arm Upper) ? ?  ? ?Patient location during evaluation: PACU ?Anesthesia Type: Regional and MAC ?Level of consciousness: awake and alert ?Pain management: pain level controlled ?Vital Signs Assessment: post-procedure vital signs reviewed and stable ?Respiratory status: spontaneous breathing, nonlabored ventilation, respiratory function stable and patient connected to nasal cannula oxygen ?Cardiovascular status: stable and blood pressure returned to baseline ?Postop Assessment: no apparent nausea or vomiting ?Anesthetic complications: no ? ? ?No notable events documented. ? ?Last Vitals:  ?Vitals:  ? 12/20/21 2200 12/20/21 2317  ?BP: (!) 162/69 (!) 155/71  ?Pulse: 77 76  ?Resp: 18 18  ?Temp:    ?SpO2: 100% 100%  ?  ?Last Pain:  ?Vitals:  ? 12/20/21 2000  ?TempSrc: Axillary  ?PainSc:   ? ? ?  ?  ?  ?  ?  ?  ? ?March Rummage Zenobia Kuennen ? ? ? ? ?

## 2021-12-20 NOTE — Consult Note (Signed)
The patient was seen and evaluated this morning during ?Given the multiple comorbidities and the concern for potential necrotizing fasciitis of the right upper extremity we will proceed with right upper extremity incision and drainage and wound irrigation.  Is difficult to determine whether or not the swelling and blistering is from infection or from an infiltration. ?We will proceed accordingly.  Care was discussed with the patient's family member who verbalized consent to proceed with the intervention. ?Site was marked today consent was signed. ?

## 2021-12-20 NOTE — Plan of Care (Signed)
?  Problem: Clinical Measurements: ?Goal: Respiratory complications will improve ?Outcome: Not Progressing ?  ?Problem: Activity: ?Goal: Risk for activity intolerance will decrease ?Outcome: Not Progressing ?  ?Problem: Nutrition: ?Goal: Adequate nutrition will be maintained ?Outcome: Not Progressing ?  ?

## 2021-12-20 NOTE — Op Note (Signed)
PREOPERATIVE DIAGNOSIS: Right forearm pain and swelling with concern for infectious fasciitis ? ?POSTOPERATIVE DIAGNOSIS: Right forearm swelling did not appear infectious ? ?ATTENDING SURGEON: Dr. Iran Planas who scrubbed and present for the entire procedure ? ?ASSISTANT SURGEON: None ? ?ANESTHESIA: Regional with IV sedation ? ?OPERATIVE PROCEDURE: Right forearm fascial biopsy and fasciotomy ?Right forearm incision and drainage ? ?IMPLANTS: None ? ?EBL: Minimal ? ?RADIOGRAPHIC INTERPRETATION: None ? ?SURGICAL INDICATIONS: Patient is a right-hand dominant female immuno suppressed with HIV who previously been treated for necrotizing fasciitis of the left lower extremity.  Patient had worsening swelling and pain and there was concerned about potential infectious etiology in the arm.  CT scan imaging was done which was concerning for the air within the forearm and difficult to associate whether or not this was from an infectious etiology or an infiltrate.  Risk benefits and alternatives were discussed in detail with the patient's family member and a signed informed consent was obtained on the day of surgery. ? ?SURGICAL TECHNIQUE: The patient was prepped identified in the preoperative holding area marked apart a marker made on the right forearm indicate correct operative site.  The patient then brought back to operating placed supine on anesthesia table where the regional anesthetic was administered.  Patient tolerates well.  Patient was on IV antibiotics.  Well-padded tourniquet placed on the right brachium and stay with the appropriate drape.  The right upper extremities then prepped and draped in normal sterile fashion.  A timeout was called the correct site was identified the procedure then begun.  A curvilinear incision made directly over the anterior aspect of the forearm distal to the elbow crease extending distally toward the radial styloid.  Dissection carried down through the skin and subcutaneous tissue.   The patient did have the superficial blistering along the skin and this skin was then excised nearly circumferentially with blunt instrumentation.  Dissection carried down through the skin and subcutaneous tissue the patient did have a large fluid accumulation within the subcutaneous tissue.  The fascia was visualized.  Fascia biopsy was then carried out and this was sent for culture.  Forearm fasciotomy was then done to release the anterior compartment.  The fascia was carefully inspected and did not appear to be any significant abnormalities that did not appear to be infectious etiology.  There was no dishwater fluid or any fluid suggestive of an infectious process.  Cultures were then taken.  Incision and drainage of the fluid collection was then carried out in the forearm.  Forearm fasciotomy and fascial biopsy was completed.  Thorough wound irrigation done.  The skin was then loosely reapproximated and closed with Prolene suture.  Xeroform dressing sterile compressive bandage then applied.  The tourniquet was deflated with good perfusion of the hand.  The patient tolerated the procedure well. ? ?POSTOPERATIVE PLAN: Patient return back to the internal medicine service.  She will need to have the wound dressed every other day large Xeroform dressings around the blistering of the skin.  Light bandage around or over the Xeroform dressings.  Do not recommend any further invasive intervention for the arm at the current time.  Please contact me should any questions arise. ?

## 2021-12-20 NOTE — Progress Notes (Signed)
?PROGRESS NOTE ? ? ? ?Shannon [name]  IRC:[id number] DOB: Jun 09, 1956 DOA: 12/17/2021 ?PCP: [name], MD ? ? ?Brief Narrative:  ?HPI: Shannon [name] is a 66 y.o. female with medical history significant of metastatic recurrent non-small cell lung cancer (01/2020) s/p curative radiotherapy (03/2020), with recurrence in February 2023, asthma, anxiety, COPD, GERD, hypertension, hyperlipidemia and HIV was brought in to the emergency department from [facility] due to worsening of the right lower extremity wound.  Patient is a poor historian.  According to patient, she started having small blister on the dorsum of the right foot about 3 to 4 weeks ago.  For this she started medical attention in the ED on 12/02/2021.  According to her, she has been prescribed multiple antibiotics.  Currently patient is extremely emotional and cannot remember the names of her the dates of antibiotics and whether she ever completed any of those courses.  Nonetheless, she was noted to have worsening of the right lower extremity wound so she was sent to the ED today.  Patient denies any other complaint other than right lower extremity pain.  No fever, chills, sweating or any other complaint. ?  ?ED Course: Upon arrival to ED, she was slightly tachycardic with slightly elevated blood pressure but she was afebrile and no tachypnea.  White cells were only 11.5.  Her CKD stage IIIa was at baseline.  She was diagnosed with right foot abscess and possible necrotizing fasciitis based on the CT of the lower extremity.  ED physician had consulted with orthopedics who recommended admission under hospitalist service and they we will proceed with surgical intervention tonight. ? ?Assessment & Plan: ?  ?Principal Problem: ?  Abscess of foot ?Active Problems: ?  HIV disease (Shannon [name]) ?  Uncontrolled hypertension ?  Morbid (severe) obesity due to excess calories (Shannon [name]) ?  Atrial flutter ([place]) ?  Chronic anticoagulation ?  (HFpEF) heart failure with preserved  ejection fraction (Shannon [name]) ?  Adenocarcinoma of lung (Shannon [name]) ?  Abscess of right foot ?  Necrotizing fasciitis (Shannon [name]) ? ?Abscess and necrotizing fasciitis of the right lower extremity and mild cellulitis of left lower extremity: S/p right foot incision and drainage of the deep abscess by orthopedics on 12/17/2021.  She is going gram-negative rods and gram-positive cocci in pairs.  Was on cefepime, linezolid and Flagyl, seen by ID, antibiotics transitioned to linezolid and Zosyn on 12/19/2021.  PICC line placed on the same day as well since she was hard stick. ? ?HIV disease: Continue home medications/Biktarvy. ?  ?Permanent atrial flutter/fibrillation: Rate is controlled.  Continue diltiazem.  Xarelto on hold due to need of possible recurrent surgical interventions.  We have placed the patient on full dose Lovenox in the meantime. ?  ?Hyperkalemia: Resolved. ?  ?Type 2 diabetes mellitus: Hemoglobin A1c 7.3.  Blood sugar controlled on SSI. ?  ?AKI on CKD stage IIIa: Patient's baseline creatinine appears to be around 1.1 with GFR between 55 and 60 but currently creatinine 1.86 with GFR 29.  Likely secondary to infection.  Creatinine slowly improving. ? ?Lethargy: Due to acute and severe sickness as well as CO2 retention.  Patient was much more alert compared to yesterday but kept falling asleep during the conversation. ? ?Possible necrotizing fasciitis of the right upper extremity: Received a message from primary RN earlier informing me that orthopedics had seen the patient and they had noted some infiltration in the right upper extremity and wanted to convey the message to me to consult orthopedics to  possibly take a look at that and rule out infection. .  Patient seen and examined at the bedside second time.  Patient was confused and slightly lethargic.  On my examination, patient did have some signs of infiltration and superficial blisters in the right upper extremity.  Stat chest x-ray was done which shows no new  pathology.  Stat ABG was done which showed respiratory acidosis.  I called and discussed the case with Dr. Kathaleen Bury of orthopedics who had already called hand surgeon.  They ordered right upper extremity ultrasound which was negative for DVT.  They also ordered CT of the right forearm which was reported around 11 PM on 12/19/2021 and reports were called to on-call orthopedic team.  It appears that they have planned to do surgical intervention/exploration today.  Patient's hand is cold on my examination.  Patient still is confused. ? ?Respiratory acidosis: ABG and CO2 is improving.  We will continue as needed BiPAP. ? ?DVT prophylaxis:  ?  Code Status: Full Code  ?Family Communication:  None present at bedside.  Tried calling patient's Darvin Neighbours flowers at [phone], unable to reach her and unable to leave a voicemail as well. ? ?Status is: Inpatient ?Remains inpatient appropriate because: Needs further surgical intervention. ? ? ?Estimated body mass index is 39.29 kg/m? as calculated from the following: ?  Height as of this encounter: 5\' 5"  (1.651 m). ?  Weight as of this encounter: 107.1 kg. ? ?  ?Nutritional Assessment: ?Body mass index is 39.29 kg/m?Marland KitchenMarland Kitchen ?Seen by dietician.  I agree with the assessment and plan as outlined below: ?Nutrition Status: ?Nutrition Problem: Increased nutrient needs ?Etiology: wound healing ?Signs/Symptoms: estimated needs ?Interventions: Ensure Enlive (each supplement provides 350kcal and 20 grams of protein), MVI, Prostat, Juven ? ?. ?Skin Assessment: ?I have examined the patient's skin and I agree with the wound assessment as performed by the wound care RN as outlined below: ?  ? ?Consultants:  ?Orthopedics ?ID ?Procedures:  ?As above ? ?Antimicrobials:  ?Anti-infectives (From admission, onward)  ? ? Start     Dose/Rate Route Frequency Ordered Stop  ? 12/21/21 1000  fluconazole (DIFLUCAN) IVPB 400 mg       ? 400 mg ?100 mL/hr over 120 Minutes Intravenous Every 24 hours 12/19/21  1817    ? 12/20/21 1000  fluconazole (DIFLUCAN) IVPB 800 mg       ? 800 mg ?200 mL/hr over 120 Minutes Intravenous  Once 12/19/21 1817    ? 12/19/21 1715  piperacillin-tazobactam (ZOSYN) IVPB 3.375 g       ? 3.375 g ?12.5 mL/hr over 240 Minutes Intravenous Every 8 hours 12/19/21 1624    ? 12/19/21 1700  micafungin (MYCAMINE) 100 mg in sodium chloride 0.9 % 100 mL IVPB       ? 100 mg ?105 mL/hr over 1 Hours Intravenous Every 24 hours 12/19/21 1617 12/19/21 1904  ? 12/18/21 0500  metroNIDAZOLE (FLAGYL) IVPB 500 mg  Status:  Discontinued       ? 500 mg ?100 mL/hr over 60 Minutes Intravenous Every 12 hours 12/17/21 1851 12/19/21 1624  ? 12/18/21 0400  ceFEPIme (MAXIPIME) 2 g in sodium chloride 0.9 % 100 mL IVPB  Status:  Discontinued       ? 2 g ?200 mL/hr over 30 Minutes Intravenous Every 12 hours 12/17/21 1851 12/19/21 1624  ? 12/18/21 0100  linezolid (ZYVOX) IVPB 600 mg       ? 600 mg ?300 mL/hr over 60 Minutes Intravenous Every 12  hours 12/17/21 1851    ? 12/17/21 2255  vancomycin (VANCOCIN) powder  Status:  Discontinued       ?   As needed 12/17/21 2324 12/18/21 0132  ? 12/17/21 1745  bictegravir-emtricitabine-tenofovir AF (BIKTARVY) 50-200-25 MG per tablet 1 tablet  Status:  Discontinued       ? 1 tablet Oral Daily 12/17/21 1737 12/17/21 1802  ? 12/17/21 1745  vancomycin (VANCOCIN) IVPB 1000 mg/200 mL premix  Status:  Discontinued       ? 1,000 mg ?200 mL/hr over 60 Minutes Intravenous  Once 12/17/21 1737 12/17/21 1742  ? 12/17/21 1745  meropenem (MERREM) 1 g in sodium chloride 0.9 % 100 mL IVPB  Status:  Discontinued       ? 1 g ?200 mL/hr over 30 Minutes Intravenous  Once 12/17/21 1737 12/17/21 1852  ? 12/17/21 1745  clindamycin (CLEOCIN) capsule 600 mg  Status:  Discontinued       ? 600 mg Oral Every 8 hours 12/17/21 1737 12/17/21 1851  ? 12/17/21 1530  ceFEPIme (MAXIPIME) 2 g in sodium chloride 0.9 % 100 mL IVPB  Status:  Discontinued       ? 2 g ?200 mL/hr over 30 Minutes Intravenous 2 times daily 12/17/21  1526 12/17/21 1800  ? 12/17/21 1530  metroNIDAZOLE (FLAGYL) IVPB 500 mg       ? 500 mg ?100 mL/hr over 60 Minutes Intravenous Every 12 hours 12/17/21 1526 12/17/21 1830  ? 12/17/21 1515  clindamycin (CLEOCIN) c

## 2021-12-20 NOTE — Anesthesia Preprocedure Evaluation (Addendum)
Anesthesia Evaluation  ?Patient identified by MRN, date of birth, ID band ?Patient confused ? ? ? ?Reviewed: ?Allergy & Precautions, NPO status , Patient's Chart, lab work & pertinent test results ? ?History of Anesthesia Complications ?Negative for: history of anesthetic complications ? ?Airway ?Mallampati: II ? ?TM Distance: >3 FB ?Neck ROM: Full ? ? ? Dental ? ?(+) Edentulous Upper, Missing,  ?  ?Pulmonary ?asthma , COPD,  COPD inhaler and oxygen dependent, former smoker,  ?On BPAP 2/2 respiratory acidosis. Metastatic lung Ca ? On BPAP 18/8 30% ? ? ?+ decreased breath sounds+ wheezing ? ? ? ? ? Cardiovascular ?hypertension, Pt. on medications ?+ CAD and +CHF  ?+ dysrhythmias Atrial Fibrillation  ?Rhythm:Irregular Rate:Normal ? ? ?  ?Neuro/Psych ?Anxiety negative neurological ROS ?   ? GI/Hepatic ?Neg liver ROS, GERD  Medicated and Controlled,  ?Endo/Other  ?Morbid obesity ? Renal/GU ?Renal InsufficiencyRenal disease  ?negative genitourinary ?  ?Musculoskeletal ? ?(+) Arthritis , GANGRENE OF RIGHT FOOT s/p debridement 4/26 now with RUE abscess  ? Abdominal ?  ?Peds ? Hematology ? ?(+) Blood dyscrasia, anemia , HIV, Lab Results ?     Component                Value               Date                 ?     WBC                      6.3                 12/20/2021           ?     HGB                      9.7 (L)             12/20/2021           ?     HCT                      31.9 (L)            12/20/2021           ?     MCV                      106.7 (H)           12/20/2021           ?     PLT                      129 (L)             12/20/2021           ?Lab Results ?     Component                Value               Date                 ?     NA                       141  12/20/2021           ?     K                        4.1                 12/20/2021           ?     CO2                      31                  12/20/2021           ?     GLUCOSE                  117  (H)             12/20/2021           ?     BUN                      29 (H)              12/20/2021           ?     CREATININE               1.46 (H)            12/20/2021           ?     CALCIUM                  9.4                 12/20/2021           ?     EGFR                     38 (L)              08/27/2021           ?     GFRNONAA                 39 (L)              12/20/2021             ?Anesthesia Other Findings ? ? Reproductive/Obstetrics ?negative OB ROS ? ?  ? ? ? ? ? ? ? ? ? ? ? ? ? ?  ?  ? ? ? ? ? ?Anesthesia Physical ? ?Anesthesia Plan ? ?ASA: 4 and emergent ? ?Anesthesia Plan: MAC and Regional  ? ?Post-op Pain Management: Regional block*  ? ?Induction: Intravenous ? ?PONV Risk Score and Plan: 2 and Treatment may vary due to age or medical condition, Ondansetron, Dexamethasone and Propofol infusion ? ?Airway Management Planned: Mask ? ?Additional Equipment: None ? ?Intra-op Plan:  ? ?Post-operative Plan:  ? ?Informed Consent: I have reviewed the patients History and Physical, chart, labs and discussed the procedure including the risks, benefits and alternatives for the proposed anesthesia with the patient or authorized representative who has indicated his/her understanding and acceptance.  ? ? ? ?Consent reviewed with POA ? ?Plan Discussed with: CRNA ? ?Anesthesia Plan Comments:   ? ? ? ? ?Anesthesia Quick Evaluation ? ?

## 2021-12-20 NOTE — Progress Notes (Signed)
Patient transported to OR holding/PACU by this RN and RT with Bipap. Handoff report to anesthesia and surgeon. ?

## 2021-12-20 NOTE — Transfer of Care (Signed)
Immediate Anesthesia Transfer of Care Note ? ?Patient: ASAL TEAS ? ?Procedure(s) Performed: IRRIGATION AND DEBRIDEMENT RT. FOREARM (Right: Arm Upper) ? ?Patient Location: PACU ? ?Anesthesia Type:MAC ? ?Level of Consciousness: sedated, patient cooperative and responds to stimulation ? ?Airway & Oxygen Therapy: Patient Spontanous Breathing and placed on bipap by rt ? ?Post-op Assessment: Report given to RN, Post -op Vital signs reviewed and stable and pt placed back on bipap by RT ? ?Post vital signs: Reviewed and stable ? ?Last Vitals:  ?Vitals Value Taken Time  ?BP 151/64 12/20/21 1230  ?Temp    ?Pulse 67 12/20/21 1232  ?Resp 18 12/20/21 1232  ?SpO2 100 % 12/20/21 1232  ?Vitals shown include unvalidated device data. ? ?Last Pain:  ?Vitals:  ? 12/20/21 0442  ?TempSrc: Oral  ?PainSc:   ?   ? ?Patients Stated Pain Goal: 4 (12/18/21 1706) ? ?Complications: No notable events documented. ?

## 2021-12-20 NOTE — Progress Notes (Addendum)
Received a message from the nurse that her speech therapist had noticed reduced/no breathing sounds on lung auscultation.  I saw patient at the bedside.  Patient was on BiPAP and lethargic.  I was also unable to hear much sounds bilaterally.  Patient underwent right upper extremity fasciotomy.  I have ordered stat ABGs and chest x-ray.  Due to patient's worsening condition and sickness, we are going to transfer her to stepdown unit.  I have also consulted PCCM and discussed case in person with Dr. Erin Fulling.  Updated her sister Sharyn Lull over the phone.  ?

## 2021-12-21 ENCOUNTER — Other Ambulatory Visit: Payer: Self-pay

## 2021-12-21 ENCOUNTER — Encounter (HOSPITAL_COMMUNITY): Payer: Self-pay | Admitting: Orthopedic Surgery

## 2021-12-21 DIAGNOSIS — A480 Gas gangrene: Secondary | ICD-10-CM | POA: Diagnosis not present

## 2021-12-21 DIAGNOSIS — M726 Necrotizing fasciitis: Secondary | ICD-10-CM | POA: Diagnosis not present

## 2021-12-21 DIAGNOSIS — L02619 Cutaneous abscess of unspecified foot: Secondary | ICD-10-CM | POA: Diagnosis not present

## 2021-12-21 LAB — AEROBIC/ANAEROBIC CULTURE W GRAM STAIN (SURGICAL/DEEP WOUND)

## 2021-12-21 LAB — GLUCOSE, CAPILLARY
Glucose-Capillary: 117 mg/dL — ABNORMAL HIGH (ref 70–99)
Glucose-Capillary: 161 mg/dL — ABNORMAL HIGH (ref 70–99)
Glucose-Capillary: 174 mg/dL — ABNORMAL HIGH (ref 70–99)
Glucose-Capillary: 186 mg/dL — ABNORMAL HIGH (ref 70–99)
Glucose-Capillary: 187 mg/dL — ABNORMAL HIGH (ref 70–99)
Glucose-Capillary: 197 mg/dL — ABNORMAL HIGH (ref 70–99)

## 2021-12-21 LAB — CBC WITH DIFFERENTIAL/PLATELET
Abs Immature Granulocytes: 0.06 10*3/uL (ref 0.00–0.07)
Basophils Absolute: 0 10*3/uL (ref 0.0–0.1)
Basophils Relative: 0 %
Eosinophils Absolute: 0 10*3/uL (ref 0.0–0.5)
Eosinophils Relative: 0 %
HCT: 28.8 % — ABNORMAL LOW (ref 36.0–46.0)
Hemoglobin: 9.1 g/dL — ABNORMAL LOW (ref 12.0–15.0)
Immature Granulocytes: 1 %
Lymphocytes Relative: 4 %
Lymphs Abs: 0.2 10*3/uL — ABNORMAL LOW (ref 0.7–4.0)
MCH: 32.5 pg (ref 26.0–34.0)
MCHC: 31.6 g/dL (ref 30.0–36.0)
MCV: 102.9 fL — ABNORMAL HIGH (ref 80.0–100.0)
Monocytes Absolute: 0.1 10*3/uL (ref 0.1–1.0)
Monocytes Relative: 2 %
Neutro Abs: 4.4 10*3/uL (ref 1.7–7.7)
Neutrophils Relative %: 93 %
Platelets: 125 10*3/uL — ABNORMAL LOW (ref 150–400)
RBC: 2.8 MIL/uL — ABNORMAL LOW (ref 3.87–5.11)
RDW: 13.1 % (ref 11.5–15.5)
WBC: 4.8 10*3/uL (ref 4.0–10.5)
nRBC: 0 % (ref 0.0–0.2)

## 2021-12-21 LAB — BASIC METABOLIC PANEL
Anion gap: 8 (ref 5–15)
BUN: 27 mg/dL — ABNORMAL HIGH (ref 8–23)
CO2: 27 mmol/L (ref 22–32)
Calcium: 9.4 mg/dL (ref 8.9–10.3)
Chloride: 107 mmol/L (ref 98–111)
Creatinine, Ser: 1.45 mg/dL — ABNORMAL HIGH (ref 0.44–1.00)
GFR, Estimated: 40 mL/min — ABNORMAL LOW (ref >60)
Glucose, Bld: 184 mg/dL — ABNORMAL HIGH (ref 70–99)
Potassium: 4 mmol/L (ref 3.5–5.1)
Sodium: 142 mmol/L (ref 135–145)

## 2021-12-21 LAB — TRIGLYCERIDES: Triglycerides: 180 mg/dL — ABNORMAL HIGH (ref <150)

## 2021-12-21 MED ORDER — ADULT MULTIVITAMIN LIQUID CH
15.0000 mL | Freq: Every day | ORAL | Status: DC
  Administered 2021-12-22 – 2021-12-24 (×3): 15 mL via ORAL
  Filled 2021-12-21 (×3): qty 15

## 2021-12-21 MED ORDER — DOLUTEGRAVIR SODIUM 50 MG PO TABS
50.0000 mg | ORAL_TABLET | Freq: Every day | ORAL | Status: DC
  Administered 2021-12-22: 50 mg
  Filled 2021-12-21 (×2): qty 1

## 2021-12-21 MED ORDER — SPIRONOLACTONE 25 MG PO TABS
25.0000 mg | ORAL_TABLET | Freq: Every day | ORAL | Status: DC
  Administered 2021-12-22: 25 mg
  Filled 2021-12-21: qty 1

## 2021-12-21 MED ORDER — QUETIAPINE FUMARATE 50 MG PO TABS
25.0000 mg | ORAL_TABLET | Freq: Every day | ORAL | Status: DC
  Administered 2021-12-21: 25 mg
  Filled 2021-12-21: qty 1

## 2021-12-21 MED ORDER — EMTRICITABINE-TENOFOVIR AF 200-25 MG PO TABS
1.0000 | ORAL_TABLET | Freq: Every day | ORAL | Status: DC
  Administered 2021-12-22: 1
  Filled 2021-12-21 (×2): qty 1

## 2021-12-21 MED ORDER — ZINC SULFATE 220 (50 ZN) MG PO CAPS
220.0000 mg | ORAL_CAPSULE | Freq: Every day | ORAL | Status: DC
  Administered 2021-12-22 – 2021-12-23 (×2): 220 mg
  Filled 2021-12-21 (×2): qty 1

## 2021-12-21 MED ORDER — FAMOTIDINE 40 MG/5ML PO SUSR
40.0000 mg | Freq: Every day | ORAL | Status: DC
  Administered 2021-12-23: 40 mg
  Filled 2021-12-21 (×3): qty 5

## 2021-12-21 MED ORDER — ASCORBIC ACID 500 MG PO TABS
500.0000 mg | ORAL_TABLET | Freq: Two times a day (BID) | ORAL | Status: DC
  Administered 2021-12-21 – 2021-12-23 (×4): 500 mg
  Filled 2021-12-21 (×4): qty 1

## 2021-12-21 NOTE — Progress Notes (Signed)
The patient's chart was reviewed. ?For management of the right upper extremity I think this is reasonable to proceed with wound care or the wound care team to change the dressings daily.  Patient will need Xeroform dressings daily over the regions to include the surgical site and over the areas that were blistered and the weeping wounds on the right upper extremity.  I do not recommend any further surgical intervention.  Fascial biopsy was obtained and sent for culture so analysis of the fascial biopsy would be helpful once again it did not appear to be an infectious origin of the right upper extremity swelling. ?

## 2021-12-21 NOTE — Consult Note (Signed)
? ? ?ORTHOPAEDIC CONSULTATION ? ?REQUESTING PHYSICIAN: Freddi Starr, MD ? ?Chief Complaint: Gangrenous necrotic ulcers right foot and right calf. ? ?HPI: ?Shannon [name] is a 66 y.o. female who presents with abscess and ulceration right foot and right calf.  Patient underwent debridement of the foot and calf on 12/17/2021.  Surgical findings showed necrotic and purulent soft tissue which underwent sharp debridement of both the foot and leg. ? ?Past Medical History:  ?Diagnosis Date  ? Anemia   ? Anxiety   ? HX PANIC ATTACKS  ? Arthritis   ? "starting to; in my hands" (07/09/2015)  ? Asthma   ? Atrial fibrillation ([place])   ? Atrial flutter, paroxysmal ([place])   ? Bloated abdomen   ? CFS (chronic fatigue syndrome)   ? Chewing difficulty   ? Chronic asthma with acute exacerbation   ? "I have chronic asthma all the time; sometimes exacerbations" (07/09/2015)  ? Chronic lower back pain   ? COPD (chronic obstructive pulmonary disease) ([place])   ? Cyst of right kidney   ? "3 of them; dx'd in ~ 01/2015"  ? Dyspnea   ? GERD (gastroesophageal reflux disease)   ? Heart murmur   ? History of blood transfusion   ? "related to my brain surgery I think"  ? History of pulmonary embolism 07/09/2015  ? History of radiation therapy 04/09/20-04/22/20  ? SBRT Left Lung, Dr. Gery Pray  ? History of radiation therapy   ? lumbar spine 10/08/2021-10/28/2021  Dr Gery Pray  ? HIV disease ([place])   ? Hyperlipidemia   ? Hypertension   ? Leg edema   ? Lipodystrophy   ? Mild CAD 2013  ? Multiple thyroid nodules   ? Osteopenia   ? Palpitations   ? Pneumonia 07/09/2015  ? Shingles   ? Vitamin B 12 deficiency   ? Vitamin D deficiency   ? ?Past Surgical History:  ?Procedure Laterality Date  ? ABDOMINAL HYSTERECTOMY    ? "robotic laparosopic"  ? BRAIN SURGERY  1974  ? "brain tumor; benign; on top of my brain; got a plate in there"  ? BRAIN SURGERY    ? age 68- -"Tumor pushing my skullout"  ? BRONCHIAL BIOPSY  02/13/2020  ? Procedure: BRONCHIAL  BIOPSIES;  Surgeon: [name], MD;  [facility] ENDOSCOPY;  Service: Pulmonary;;  ? BRONCHIAL BRUSHINGS  02/13/2020  ? Procedure: BRONCHIAL BRUSHINGS;  Surgeon: [name], MD;  Location[facility] ENDOSCOPY;  Service: Pulmonary;;  ? BRONCHIAL NEEDLE ASPIRATION BIOPSY  02/13/2020  ? Procedure: BRONCHIAL NEEDLE ASPIRATION BIOPSIES;  Surgeon: [name], MD;  Location: [facility] ENDOSCOPY;  Service: Pulmonary;;  ? CARDIAC CATHETERIZATION    ? FIDUCIAL MARKER PLACEMENT  02/13/2020  ? Procedure: FIDUCIAL MARKER PLACEMENT;  Surgeon: [name], MD;  Location: [facility] ENDOSCOPY;  Service: Pulmonary;;  ? I & D EXTREMITY Right 12/20/2021  ? Procedure: IRRIGATION AND DEBRIDEMENT RT. FOREARM;  Surgeon: Iran Planas, MD;  Location: [facility] ORS;  Service: Orthopedics;  Laterality: Right;  ? IRRIGATION AND DEBRIDEMENT FOOT Right 12/17/2021  ? Procedure: IRRIGATION AND DEBRIDEMENT, RIGHT FOOT AND CALF;  Surgeon: Armond Hang, MD;  Location: [facility] ORS;  Service: Orthopedics;  Laterality: Right;  ? TONSILLECTOMY AND ADENOIDECTOMY    ? VIDEO BRONCHOSCOPY WITH ENDOBRONCHIAL NAVIGATION N/A 02/13/2020  ? Procedure: VIDEO BRONCHOSCOPY WITH ENDOBRONCHIAL NAVIGATION;  Surgeon: [name], MD;  Location: [facility] ENDOSCOPY;  Service: Pulmonary;  Laterality: N/A;  ? ?Social History  ? ?Socioeconomic History  ?  Marital status: Single  ?  Spouse name: Not on file  ? Number of children: Not on file  ? Years of education: Not on file  ? Highest education level: Not on file  ?Occupational History  ? Occupation: student  ?Tobacco Use  ? Smoking status: Former  ?  Types: Cigarettes  ? Smokeless tobacco: Never  ? Tobacco comments:  ?  Pt states she smoked when she was in college many years ago.  ?Vaping Use  ? Vaping Use: Never used  ?Substance and Sexual Activity  ? Alcohol use: Not Currently  ?  Alcohol/week: 0.0 standard drinks  ?  Comment: Rarely.  ? Drug use: No  ? Sexual activity: Not Currently  ?  Partners: Male  ?  Comment: declined condoms   ?Other Topics Concern  ? Not on file  ?Social History Narrative  ? Originally from [place] then [place] now [place] 2016 approx  ? Disabled   ? Rare alcohol, never smoker, no drug use  ?   ?   ?   ? ?Social Determinants of Health  ? ?Financial Resource Strain: Not on file  ?Food Insecurity: No Food Insecurity  ? Worried About Charity fundraiser in the Last Year: Never true  ? Ran Out of Food in the Last Year: Never true  ?Transportation Needs: Not on file  ?Physical Activity: Inactive  ? Days of Exercise per Week: 0 days  ? Minutes of Exercise per Session: 0 min  ?Stress: Not on file  ?Social Connections: Not on file  ? ?Family History  ?Problem Relation Age of Onset  ? Asthma Mother   ? Heart failure Mother   ?     cardiomyopathy  ? Thyroid disease Mother   ? Heart disease Mother   ? Obesity Mother   ? Hypertension Mother   ? Heart murmur Sister   ? Heart murmur Brother   ? Diabetes Sister   ? Thyroid disease Sister   ? Heart murmur Sister   ? ?- negative except otherwise stated in the family history section ?Allergies  ?Allergen Reactions  ? Lisinopril Swelling and Cough  ?  Face/throat swelling  ? Tree Extract Swelling and Other (See Comments)  ?  Swelling to eyes  ? Augmentin [Amoxicillin-Pot Clavulanate] Other (See Comments)  ?  Headache, dizzy  ? Ciprofloxacin Hives  ? ?Prior to Admission medications   ?Medication Sig Start Date End Date Taking? Authorizing Provider  ?A&D OINT Apply 1 application. topically daily as needed (rash).   Yes [provider]  ?albuterol (PROVENTIL) (2.5 MG/3ML) 0.083% nebulizer solution USE 1 VIAL VIA NEBULIZER EVERY 4 HOURS AS NEEDED FOR WHEEZING OR SHORTNESS OF BREATH ?Patient taking differently: Take 2.5 mg by nebulization every 6 (six) hours as needed for shortness of breath. 12/02/21  Yes Angelica Pou, MD  ?amoxicillin (AMOXIL) 500 MG tablet Take 1 tablet (500 mg total) by mouth 2 (two) times daily. 12/16/21  Yes [name], MD  ?Ascorbic Acid (VITAMIN C) 1000 MG tablet  Take 1,000 mg by mouth every other day.    Yes [provider]  ?b complex vitamins tablet Take 1 tablet by mouth daily.   Yes [provider]  ?BIKTARVY 50-200-25 MG TABS tablet TAKE 1 TABLET BY MOUTH DAILY 09/01/21  Yes [name], MD  ?BIOTIN PO Take 1 tablet by mouth every other day.   Yes [provider]  ?Calcium Citrate-Vitamin D (CALCIUM + D PO) Take 1 tablet by mouth  every other day.   Yes [provider]  ?CALCIUM PO Take 1 tablet by mouth daily.   Yes [provider]  ?cyanocobalamin (,VITAMIN B-12,) 1000 MCG/ML injection ADMINISTER 1 ML(1000 MCG) IN THE MUSCLE EVERY 30 DAYS ?Patient taking differently: Inject 1,000 mcg into the muscle every 30 (thirty) days. 11/12/21  Yes [name], MD  ?dexamethasone (DECADRON) 4 MG tablet Take 1 tablet (4 mg total) by mouth 2 (two) times daily with a meal. 12/16/21  Yes [name], MD  ?diltiazem (CARDIZEM CD) 240 MG 24 hr capsule TAKE 1 CAPSULE(240 MG) BY MOUTH DAILY ?Patient taking differently: Take 240 mg by mouth daily. 07/12/21  Yes [name], MD  ?famotidine (PEPCID) 40 MG tablet Take 1 tablet (40 mg total) by mouth daily. 09/04/21  Yes Esterwood, Amy S, PA-C  ?magnesium hydroxide (MILK OF MAGNESIA) 400 MG/5ML suspension Take 15 mLs by mouth daily as needed for mild constipation.   Yes [provider]  ?Melatonin 5 MG TABS Take 5 mg by mouth at bedtime as needed (for sleep).   Yes [provider]  ?Naphazoline HCl (CLEAR EYES OP) Place 1 drop into both eyes daily as needed (redness).   Yes [provider]  ?neomycin-bacitracin-polymyxin (NEOSPORIN) ointment Apply 1 application. topically daily as needed for wound care.   Yes [provider]  ?Olopatadine HCl (PATADAY OP) Place 1 drop into both eyes daily as needed (burning).   Yes [provider]  ?Oxycodone HCl 10 MG TABS Take 10 mg by mouth every 6 (six) hours as needed (breakthrough pain). 09/30/21  Yes  [provider]  ?OXYGEN Inhale 1.5-2 L into the lungs See admin instructions. Uses at bedtime and through the day as needed   Yes [provider]  ?Phenylephrine-Cocoa Butter (PREPARATION H RE)

## 2021-12-21 NOTE — Progress Notes (Signed)
PT Cancellation Note ? ?Patient Details ?Name: Shannon [name] ?MRN: [id number] ?DOB: Dec 23, 1955 ? ? ?Cancelled Treatment:    Reason Eval/Treat Not Completed: Medical issues which prohibited therapy, on ventilator at the present time.  ?Tresa Endo PT ?Acute Rehabilitation Services ?Pager [phone] ?Office [phone] ? ? ? ?Karianne Nogueira, Shella Maxim ?12/21/2021, 7:11 AM ?

## 2021-12-21 NOTE — Progress Notes (Signed)
? ?NAME:  Shannon [name], MRN:  [id number], DOB:  12-Mar-1956, LOS: 4 ?ADMISSION DATE:  12/17/2021, CONSULTATION DATE:  12/20/2021 ?REFERRING MD:  Darliss Cheney, MD CHIEF COMPLAINT:  respiratory failure  ? ?History of Present Illness:  ?Shannon [name] is a 66 year old woman with metastatic non-small cell lung cancer in 2021 with recurrence in 09/2021, asthma/COPD, GERD, hypertension and HIV who was admitted 12/17/21 from [facility] due to right lower extremity wound which had been there over the previous month.  She had irrigation and debridement of right foot and calf wounds on 12/17/21 with cultures growing klebsiella. Blood cultures from 4/26 grew candida parapsilosis and she was started on micafungin 4/28 which was changed to fluconazole 4/29.  On 4/28 she developed swelling of her right upper extremity and was evaluated by orthopedic surgery for concern of nectrotizing fasciitis or compartment syndrome so she went for incision and drainage morning of 4/29 without overt signs of necrotizing fasciitis. She developed progressive mental status changes 4/28 to 4/29 with obtundation. She was transferred to the ICU and PCCM consulted for hypercapnic respiratory failure.  ? ?Unable to obtain further history from patient due to obtundation. I spoke with patient's niece - Shannon [name] in person and patient's sister Shannon [name] over the phone. Shannon [name] consented to move forward with intubation.  ? ?Pertinent  Medical History  ?HIV ?Asthma-COPD ?GERD ?Hypertension ?Obesity ? ?Significant Hospital Events: ?Including procedures, antibiotic start and stop dates in addition to other pertinent events   ?4/26: admitted, I&D of LLE ?4/28: started on bipap, altered mentation ?4/29: I&D of RUE, transferred to ICU and intubated ? ?Interim History / Subjective:  ? ?No acute events overnight ?Patient awakened and followed commands this morning but unable to tolerate SBT due to somnolence ? ?Objective   ?Blood pressure (!) 134/53, pulse (!) 58,  temperature 97.7 ?F (36.5 ?C), temperature source Axillary, resp. rate (!) 21, height 5\' 4"  (1.626 m), weight 107.1 kg, SpO2 100 %. ?   ?Vent Mode: PRVC ?FiO2 (%):  [30 %-50 %] 40 % ?Set Rate:  [18 bmp] 18 bmp ?Vt Set:  [440 mL] 440 mL ?PEEP:  [5 cmH20] 5 cmH20 ?Plateau Pressure:  [16 cmH20-20 cmH20] 19 cmH20  ? ?Intake/Output Summary (Last 24 hours) at 12/21/2021 0731 ?Last data filed at 12/21/2021 0608 ?Gross per 24 hour  ?Intake 2963.22 ml  ?Output 625 ml  ?Net 2338.22 ml  ? ?Filed Weights  ? 12/18/21 0459 12/19/21 0354 12/20/21 0435  ?Weight: 101.4 kg 105.3 kg 107.1 kg  ? ? ?Examination: ?General: obese woman, critically ill appearing, intubated ?HENT: [place]/AT, moist mucous membranes, sclera anicteric ?Lungs: diminished breath sounds, no wheezing. ?Cardiovascular: rrr, no murmurs ?Abdomen: soft, non-tender, non-distended, bowel sounds present ?Extremities: RUE and LLE dressings in place, warm ?Neuro: arousable, somnolent, follows commands, PERRL ?GU: n/a ? ?Resolved Hospital Problem list   ? ? ?Assessment & Plan:  ?Acute on Chronic Hypercapnic Respiratory Failure ?Acute Exacerbation of COPD ?Metastatic Adenocarcinoma of the lung to bone ?In setting of sepsis due to listed infections below ?- Continue mechanical ventilation ?- PAD protocol ordered, propofol and PRN fentanyl ?- ICS/LABA/LAMA nebs, PRN albuterol nebs ?- IV solumedrol 40mg  daily ?- SBT tomorrow morning, failed today 4/30 ? ?Sepsis due to candida parasipsilosis bacteremia and Klebsiella soft tissue infection of Left lower extremity ?- Fluconazole, linezolid and Zosyn per ID ?- Follow up blood cultures and wound cultures ? ?Acute Metabolic and hypercapnic Encephalopathy ?- Due to the above issues, plans as above ? ?Acute Kidney Injury  on CKD IIIa ?- Will provide fluids as needed ? ?HFpEF ?Atrial Flutter ?HTN ?- Diltiazem for rate control ?- xarelto on hold for surgical interventions ?- Lovenox therapeutic dose ?- continue spironolactone ? ?Anemia, Acute   ?Thrombocytopenia, Acute  ?- monitor ?- transfuse for hemoglobin < 7g/dL ? ?HIV ?- change biktarvy to descovy and tivicay as can be crushed for NG tube ?- ID following ? ?DMII ?- q4hr blood sugars ?- SSI  ?- may need to adjust with adding steroids ? ?Best Practice (right click and "Reselect all SmartList Selections" daily)  ? ?Diet/type: tubefeeds ?DVT prophylaxis: LMWH ?GI prophylaxis: H2B ?Lines: Central line ?Foley:  N/A ?Code Status:  full code ?Last date of multidisciplinary goals of care discussion [4/29 discussed with patient's sister Shannon [name]] ? ?Labs   ?CBC: ?Recent Labs  ?Lab 12/17/21 ?6962 12/18/21 ?9528 12/19/21 ?4132 12/20/21 ?4401 12/21/21 ?0272  ?WBC 11.5* 10.4 8.4 6.3 4.8  ?NEUTROABS 10.3*  --  7.4 5.6 PENDING  ?HGB 12.5 12.2 10.8* 9.7* 9.1*  ?HCT 39.1 39.2 35.2* 31.9* 28.8*  ?MCV 98.2 104.0* 107.6* 106.7* 102.9*  ?PLT 191 163 146* 129* 125*  ? ? ?Basic Metabolic Panel: ?Recent Labs  ?Lab 12/17/21 ?5366 12/18/21 ?4403 12/19/21 ?4742 12/20/21 ?5956 12/21/21 ?3875  ?NA 139 142 140 141 142  ?K 5.3* 5.0 4.8 4.1 4.0  ?CL 99 104 106 106 107  ?CO2 34* 32 29 31 27   ?GLUCOSE 241* 124* 123* 117* 184*  ?BUN 46* 42* 34* 29* 27*  ?CREATININE 1.86* 1.58* 1.60* 1.46* 1.45*  ?CALCIUM 10.8* 10.3 9.6 9.4 9.4  ?MG  --  2.2  --   --   --   ? ?GFR: ?Estimated Creatinine Clearance: 45 mL/min (A) (by C-G formula based on SCr of 1.45 mg/dL (H)). ?Recent Labs  ?Lab 12/17/21 ?1120 12/17/21 ?2102 12/18/21 ?0323 12/19/21 ?0346 12/20/21 ?0458 12/20/21 ?1022 12/20/21 ?1510 12/21/21 ?6433  ?WBC  --   --  10.4 8.4 6.3  --   --  4.8  ?LATICACIDVEN 2.2* 1.5  --   --   --  0.6 0.6  --   ? ? ?Liver Function Tests: ?Recent Labs  ?Lab 12/17/21 ?2951 12/18/21 ?0323  ?AST 16 18  ?ALT 41 38  ?ALKPHOS 84 73  ?BILITOT 0.4 0.4  ?PROT 6.5 6.0*  ?ALBUMIN 3.4* 2.9*  ? ?No results for input(s): LIPASE, AMYLASE in the last 168 hours. ?No results for input(s): AMMONIA in the last 168 hours. ? ?ABG ?   ?Component Value Date/Time  ? PHART 7.43  12/20/2021 1730  ? PCO2ART 48 12/20/2021 1730  ? PO2ART 111 (H) 12/20/2021 1730  ? HCO3 31.9 (H) 12/20/2021 1730  ? TCO2 30 09/18/2016 1722  ? O2SAT 100 12/20/2021 1730  ?  ? ?Coagulation Profile: ?No results for input(s): INR, PROTIME in the last 168 hours. ? ?Cardiac Enzymes: ?No results for input(s): CKTOTAL, CKMB, CKMBINDEX, TROPONINI in the last 168 hours. ? ?HbA1C: ?Hgb A1c MFr Bld  ?Date/Time Value Ref Range Status  ?12/17/2021 09:02 PM 7.3 (H) 4.8 - 5.6 % Final  ?  Comment:  ?  (NOTE) ?Pre diabetes:          5.7%-6.4% ? ?Diabetes:              >6.4% ? ?Glycemic control for   <7.0% ?adults with diabetes ?  ?09/07/2019 02:41 PM 5.7 (H) <5.7 % of total Hgb Final  ?  Comment:  ?  For someone without known diabetes, a hemoglobin  ?A1c value  between 5.7% and 6.4% is consistent with ?prediabetes and should be confirmed with a  ?follow-up test. ?. ?For someone with known diabetes, a value <7% ?indicates that their diabetes is well controlled. A1c ?targets should be individualized based on duration of ?diabetes, age, comorbid conditions, and other ?considerations. ?. ?This assay result is consistent with an increased risk ?of diabetes. ?. ?Currently, no consensus exists regarding use of ?hemoglobin A1c for diagnosis of diabetes for children. ?. ?  ? ? ?CBG: ?Recent Labs  ?Lab 12/20/21 ?1248 12/20/21 ?1524 12/20/21 ?2013 12/21/21 ?0006 12/21/21 ?0419  ?GLUCAP 93 139* 97 117* 186*  ? ? ?Critical care time: 35 minutes ?  ? ?Freda Jackson, MD ?[facility] ?Office: [phone] ? ? ?See Amion for personal pager ?PCCM on call pager [phone] until 7pm. ?Please call Elink 7p-7a. [phone] ? ? ?  ?

## 2021-12-21 NOTE — Progress Notes (Signed)
OT Cancellation Note ? ?Patient Details ?Name: Shannon [name] ?MRN: [id number] ?DOB: 22-Nov-1955 ? ? ?Cancelled Treatment:    Reason Eval/Treat Not Completed: Medical issues which prohibited therapy ?Patient was transitioned to step down unit and is currently intubated. OT to continue to follow and check back when patient is more medically stable.  ?Deanda Ruddell OTR/L, MS ?Acute Rehabilitation Department ?Office# [phone] ?Pager# [phone] ? ?12/21/2021, 6:33 AM ?

## 2021-12-21 NOTE — Plan of Care (Signed)
?  Problem: Clinical Measurements: ?Goal: Cardiovascular complication will be avoided ?Outcome: Progressing ?  ?Problem: Coping: ?Goal: Level of anxiety will decrease ?Outcome: Progressing ?  ?Problem: Pain Managment: ?Goal: General experience of comfort will improve ?Outcome: Progressing ?  ?Problem: Clinical Measurements: ?Goal: Respiratory complications will improve ?Outcome: Not Progressing ?  ?Problem: Nutrition: ?Goal: Adequate nutrition will be maintained ?Outcome: Not Progressing ?  ?

## 2021-12-21 NOTE — H&P (View-Only) (Signed)
? ? ?ORTHOPAEDIC CONSULTATION ? ?REQUESTING PHYSICIAN: Freddi Starr, MD ? ?Chief Complaint: Gangrenous necrotic ulcers right foot and right calf. ? ?HPI: ?Shannon [name] is a 66 y.o. female who presents with abscess and ulceration right foot and right calf.  Patient underwent debridement of the foot and calf on 12/17/2021.  Surgical findings showed necrotic and purulent soft tissue which underwent sharp debridement of both the foot and leg. ? ?Past Medical History:  ?Diagnosis Date  ? Anemia   ? Anxiety   ? HX PANIC ATTACKS  ? Arthritis   ? "starting to; in my hands" (07/09/2015)  ? Asthma   ? Atrial fibrillation ([place])   ? Atrial flutter, paroxysmal ([place])   ? Bloated abdomen   ? CFS (chronic fatigue syndrome)   ? Chewing difficulty   ? Chronic asthma with acute exacerbation   ? "I have chronic asthma all the time; sometimes exacerbations" (07/09/2015)  ? Chronic lower back pain   ? COPD (chronic obstructive pulmonary disease) ([place])   ? Cyst of right kidney   ? "3 of them; dx'd in ~ 01/2015"  ? Dyspnea   ? GERD (gastroesophageal reflux disease)   ? Heart murmur   ? History of blood transfusion   ? "related to my brain surgery I think"  ? History of pulmonary embolism 07/09/2015  ? History of radiation therapy 04/09/20-04/22/20  ? SBRT Left Lung, Dr. Gery Pray  ? History of radiation therapy   ? lumbar spine 10/08/2021-10/28/2021  Dr Gery Pray  ? HIV disease ([place])   ? Hyperlipidemia   ? Hypertension   ? Leg edema   ? Lipodystrophy   ? Mild CAD 2013  ? Multiple thyroid nodules   ? Osteopenia   ? Palpitations   ? Pneumonia 07/09/2015  ? Shingles   ? Vitamin B 12 deficiency   ? Vitamin D deficiency   ? ?Past Surgical History:  ?Procedure Laterality Date  ? ABDOMINAL HYSTERECTOMY    ? "robotic laparosopic"  ? BRAIN SURGERY  1974  ? "brain tumor; benign; on top of my brain; got a plate in there"  ? BRAIN SURGERY    ? age 59- -"Tumor pushing my skullout"  ? BRONCHIAL BIOPSY  02/13/2020  ? Procedure: BRONCHIAL  BIOPSIES;  Surgeon: [name], MD;  Location[facility] ENDOSCOPY;  Service: Pulmonary;;  ? BRONCHIAL BRUSHINGS  02/13/2020  ? Procedure: BRONCHIAL BRUSHINGS;  Surgeon: [name], MD;  Location: [facility] ENDOSCOPY;  Service: Pulmonary;;  ? BRONCHIAL NEEDLE ASPIRATION BIOPSY  02/13/2020  ? Procedure: BRONCHIAL NEEDLE ASPIRATION BIOPSIES;  Surgeon: [name], MD;  Location: [facility] ENDOSCOPY;  Service: Pulmonary;;  ? CARDIAC CATHETERIZATION    ? FIDUCIAL MARKER PLACEMENT  02/13/2020  ? Procedure: FIDUCIAL MARKER PLACEMENT;  Surgeon: [name], MD;  Location: [facility] ENDOSCOPY;  Service: Pulmonary;;  ? I & D EXTREMITY Right 12/20/2021  ? Procedure: IRRIGATION AND DEBRIDEMENT RT. FOREARM;  Surgeon: Iran Planas, MD;  Location: [facility] ORS;  Service: Orthopedics;  Laterality: Right;  ? IRRIGATION AND DEBRIDEMENT FOOT Right 12/17/2021  ? Procedure: IRRIGATION AND DEBRIDEMENT, RIGHT FOOT AND CALF;  Surgeon: Armond Hang, MD;  Location: [facility] ORS;  Service: Orthopedics;  Laterality: Right;  ? TONSILLECTOMY AND ADENOIDECTOMY    ? VIDEO BRONCHOSCOPY WITH ENDOBRONCHIAL NAVIGATION N/A 02/13/2020  ? Procedure: VIDEO BRONCHOSCOPY WITH ENDOBRONCHIAL NAVIGATION;  Surgeon: [name], MD;  Location[facility] ENDOSCOPY;  Service: Pulmonary;  Laterality: N/A;  ? ?Social History  ? ?Socioeconomic History  ?  Marital status: Single  ?  Spouse name: Not on file  ? Number of children: Not on file  ? Years of education: Not on file  ? Highest education level: Not on file  ?Occupational History  ? Occupation: student  ?Tobacco Use  ? Smoking status: Former  ?  Types: Cigarettes  ? Smokeless tobacco: Never  ? Tobacco comments:  ?  Pt states she smoked when she was in college many years ago.  ?Vaping Use  ? Vaping Use: Never used  ?Substance and Sexual Activity  ? Alcohol use: Not Currently  ?  Alcohol/week: 0.0 standard drinks  ?  Comment: Rarely.  ? Drug use: No  ? Sexual activity: Not Currently  ?  Partners: Male  ?  Comment: declined condoms   ?Other Topics Concern  ? Not on file  ?Social History Narrative  ? Originally from [place] then [place] now [place] 2016 approx  ? Disabled   ? Rare alcohol, never smoker, no drug use  ?   ?   ?   ? ?Social Determinants of Health  ? ?Financial Resource Strain: Not on file  ?Food Insecurity: No Food Insecurity  ? Worried About Charity fundraiser in the Last Year: Never true  ? Ran Out of Food in the Last Year: Never true  ?Transportation Needs: Not on file  ?Physical Activity: Inactive  ? Days of Exercise per Week: 0 days  ? Minutes of Exercise per Session: 0 min  ?Stress: Not on file  ?Social Connections: Not on file  ? ?Family History  ?Problem Relation Age of Onset  ? Asthma Mother   ? Heart failure Mother   ?     cardiomyopathy  ? Thyroid disease Mother   ? Heart disease Mother   ? Obesity Mother   ? Hypertension Mother   ? Heart murmur Sister   ? Heart murmur Brother   ? Diabetes Sister   ? Thyroid disease Sister   ? Heart murmur Sister   ? ?- negative except otherwise stated in the family history section ?Allergies  ?Allergen Reactions  ? Lisinopril Swelling and Cough  ?  Face/throat swelling  ? Tree Extract Swelling and Other (See Comments)  ?  Swelling to eyes  ? Augmentin [Amoxicillin-Pot Clavulanate] Other (See Comments)  ?  Headache, dizzy  ? Ciprofloxacin Hives  ? ?Prior to Admission medications   ?Medication Sig Start Date End Date Taking? Authorizing Provider  ?A&D OINT Apply 1 application. topically daily as needed (rash).   Yes [provider]  ?albuterol (PROVENTIL) (2.5 MG/3ML) 0.083% nebulizer solution USE 1 VIAL VIA NEBULIZER EVERY 4 HOURS AS NEEDED FOR WHEEZING OR SHORTNESS OF BREATH ?Patient taking differently: Take 2.5 mg by nebulization every 6 (six) hours as needed for shortness of breath. 12/02/21  Yes Angelica Pou, MD  ?amoxicillin (AMOXIL) 500 MG tablet Take 1 tablet (500 mg total) by mouth 2 (two) times daily. 12/16/21  Yes [name], MD  ?Ascorbic Acid (VITAMIN C) 1000 MG tablet  Take 1,000 mg by mouth every other day.    Yes [provider]  ?b complex vitamins tablet Take 1 tablet by mouth daily.   Yes [provider]  ?BIKTARVY 50-200-25 MG TABS tablet TAKE 1 TABLET BY MOUTH DAILY 09/01/21  Yes [name], MD  ?BIOTIN PO Take 1 tablet by mouth every other day.   Yes [provider]  ?Calcium Citrate-Vitamin D (CALCIUM + D PO) Take 1 tablet by mouth  every other day.   Yes [provider]  ?CALCIUM PO Take 1 tablet by mouth daily.   Yes [provider]  ?cyanocobalamin (,VITAMIN B-12,) 1000 MCG/ML injection ADMINISTER 1 ML(1000 MCG) IN THE MUSCLE EVERY 30 DAYS ?Patient taking differently: Inject 1,000 mcg into the muscle every 30 (thirty) days. 11/12/21  Yes [name], MD  ?dexamethasone (DECADRON) 4 MG tablet Take 1 tablet (4 mg total) by mouth 2 (two) times daily with a meal. 12/16/21  Yes [name], MD  ?diltiazem (CARDIZEM CD) 240 MG 24 hr capsule TAKE 1 CAPSULE(240 MG) BY MOUTH DAILY ?Patient taking differently: Take 240 mg by mouth daily. 07/12/21  Yes [name], MD  ?famotidine (PEPCID) 40 MG tablet Take 1 tablet (40 mg total) by mouth daily. 09/04/21  Yes Esterwood, Amy S, PA-C  ?magnesium hydroxide (MILK OF MAGNESIA) 400 MG/5ML suspension Take 15 mLs by mouth daily as needed for mild constipation.   Yes [provider]  ?Melatonin 5 MG TABS Take 5 mg by mouth at bedtime as needed (for sleep).   Yes [provider]  ?Naphazoline HCl (CLEAR EYES OP) Place 1 drop into both eyes daily as needed (redness).   Yes [provider]  ?neomycin-bacitracin-polymyxin (NEOSPORIN) ointment Apply 1 application. topically daily as needed for wound care.   Yes [provider]  ?Olopatadine HCl (PATADAY OP) Place 1 drop into both eyes daily as needed (burning).   Yes [provider]  ?Oxycodone HCl 10 MG TABS Take 10 mg by mouth every 6 (six) hours as needed (breakthrough pain). 09/30/21  Yes  [provider]  ?OXYGEN Inhale 1.5-2 L into the lungs See admin instructions. Uses at bedtime and through the day as needed   Yes [provider]  ?Phenylephrine-Cocoa Butter (PREPARATION H RE)

## 2021-12-21 NOTE — Progress Notes (Signed)
Patient transferred to stepdown yesterday.  Per chart review, patient was intubated right away after arrival to ICU/stepdown unit.  She now appears to have fungemia and wound cultures growing Pseudomonas.  She is on Diflucan, antibiotics and anti-infectives managed by ID.  ICU team has now taken over as primary service.  Hospitalist will sign off.  We will be happy to assume care of this patient as primary once she is extubated and transferred to medical floor. ?

## 2021-12-22 ENCOUNTER — Other Ambulatory Visit: Payer: Medicare Other

## 2021-12-22 ENCOUNTER — Telehealth: Payer: Medicare Other

## 2021-12-22 DIAGNOSIS — L8990 Pressure ulcer of unspecified site, unspecified stage: Secondary | ICD-10-CM | POA: Insufficient documentation

## 2021-12-22 DIAGNOSIS — A480 Gas gangrene: Secondary | ICD-10-CM | POA: Diagnosis not present

## 2021-12-22 DIAGNOSIS — B20 Human immunodeficiency virus [HIV] disease: Secondary | ICD-10-CM

## 2021-12-22 DIAGNOSIS — L02619 Cutaneous abscess of unspecified foot: Secondary | ICD-10-CM | POA: Diagnosis not present

## 2021-12-22 DIAGNOSIS — J9602 Acute respiratory failure with hypercapnia: Secondary | ICD-10-CM

## 2021-12-22 DIAGNOSIS — L02611 Cutaneous abscess of right foot: Secondary | ICD-10-CM | POA: Diagnosis not present

## 2021-12-22 DIAGNOSIS — B49 Unspecified mycosis: Secondary | ICD-10-CM

## 2021-12-22 DIAGNOSIS — M726 Necrotizing fasciitis: Secondary | ICD-10-CM | POA: Diagnosis not present

## 2021-12-22 LAB — CBC WITH DIFFERENTIAL/PLATELET
Abs Immature Granulocytes: 0.08 10*3/uL — ABNORMAL HIGH (ref 0.00–0.07)
Basophils Absolute: 0 10*3/uL (ref 0.0–0.1)
Basophils Relative: 0 %
Eosinophils Absolute: 0 10*3/uL (ref 0.0–0.5)
Eosinophils Relative: 0 %
HCT: 28.8 % — ABNORMAL LOW (ref 36.0–46.0)
Hemoglobin: 9.1 g/dL — ABNORMAL LOW (ref 12.0–15.0)
Immature Granulocytes: 2 %
Lymphocytes Relative: 6 %
Lymphs Abs: 0.3 10*3/uL — ABNORMAL LOW (ref 0.7–4.0)
MCH: 31.8 pg (ref 26.0–34.0)
MCHC: 31.6 g/dL (ref 30.0–36.0)
MCV: 100.7 fL — ABNORMAL HIGH (ref 80.0–100.0)
Monocytes Absolute: 0.2 10*3/uL (ref 0.1–1.0)
Monocytes Relative: 4 %
Neutro Abs: 4.7 10*3/uL (ref 1.7–7.7)
Neutrophils Relative %: 88 %
Platelets: 129 10*3/uL — ABNORMAL LOW (ref 150–400)
RBC: 2.86 MIL/uL — ABNORMAL LOW (ref 3.87–5.11)
RDW: 13.5 % (ref 11.5–15.5)
WBC: 5.3 10*3/uL (ref 4.0–10.5)
nRBC: 0.9 % — ABNORMAL HIGH (ref 0.0–0.2)

## 2021-12-22 LAB — AEROBIC/ANAEROBIC CULTURE W GRAM STAIN (SURGICAL/DEEP WOUND)

## 2021-12-22 LAB — GLUCOSE, CAPILLARY
Glucose-Capillary: 139 mg/dL — ABNORMAL HIGH (ref 70–99)
Glucose-Capillary: 147 mg/dL — ABNORMAL HIGH (ref 70–99)
Glucose-Capillary: 154 mg/dL — ABNORMAL HIGH (ref 70–99)
Glucose-Capillary: 179 mg/dL — ABNORMAL HIGH (ref 70–99)
Glucose-Capillary: 199 mg/dL — ABNORMAL HIGH (ref 70–99)
Glucose-Capillary: 204 mg/dL — ABNORMAL HIGH (ref 70–99)
Glucose-Capillary: 209 mg/dL — ABNORMAL HIGH (ref 70–99)

## 2021-12-22 LAB — SURGICAL PATHOLOGY

## 2021-12-22 LAB — CULTURE, BLOOD (ROUTINE X 2)
Culture: NO GROWTH
Special Requests: ADEQUATE

## 2021-12-22 MED ORDER — DILTIAZEM 12 MG/ML ORAL SUSPENSION
60.0000 mg | Freq: Four times a day (QID) | ORAL | Status: DC
  Filled 2021-12-22: qty 5

## 2021-12-22 MED ORDER — POLYETHYLENE GLYCOL 3350 17 G PO PACK
17.0000 g | PACK | Freq: Every day | ORAL | Status: DC
  Administered 2021-12-23 – 2021-12-31 (×5): 17 g via ORAL
  Filled 2021-12-22 (×6): qty 1

## 2021-12-22 MED ORDER — MIDAZOLAM HCL 2 MG/2ML IJ SOLN
INTRAMUSCULAR | Status: AC
  Filled 2021-12-22: qty 2

## 2021-12-22 MED ORDER — DILTIAZEM HCL 60 MG PO TABS
60.0000 mg | ORAL_TABLET | Freq: Four times a day (QID) | ORAL | Status: DC
  Administered 2021-12-22 – 2021-12-25 (×11): 60 mg via ORAL
  Filled 2021-12-22 (×11): qty 1

## 2021-12-22 MED ORDER — SODIUM CHLORIDE 0.9 % IV SOLN
3.0000 g | Freq: Four times a day (QID) | INTRAVENOUS | Status: DC
  Administered 2021-12-22 – 2021-12-29 (×28): 3 g via INTRAVENOUS
  Filled 2021-12-22 (×30): qty 8

## 2021-12-22 MED ORDER — BICTEGRAVIR-EMTRICITAB-TENOFOV 50-200-25 MG PO TABS
1.0000 | ORAL_TABLET | Freq: Every day | ORAL | Status: DC
  Administered 2021-12-23 – 2022-01-14 (×23): 1 via ORAL
  Filled 2021-12-22 (×23): qty 1

## 2021-12-22 MED ORDER — FENTANYL CITRATE PF 50 MCG/ML IJ SOSY
50.0000 ug | PREFILLED_SYRINGE | Freq: Once | INTRAMUSCULAR | Status: DC
  Filled 2021-12-22: qty 1

## 2021-12-22 MED ORDER — QUETIAPINE FUMARATE 25 MG PO TABS
25.0000 mg | ORAL_TABLET | Freq: Every day | ORAL | Status: DC
  Administered 2021-12-22 – 2022-01-02 (×12): 25 mg via ORAL
  Filled 2021-12-22 (×12): qty 1

## 2021-12-22 MED ORDER — SPIRONOLACTONE 25 MG PO TABS
25.0000 mg | ORAL_TABLET | Freq: Every day | ORAL | Status: DC
Start: 2021-12-23 — End: 2022-01-03
  Administered 2021-12-23 – 2022-01-03 (×10): 25 mg via ORAL
  Filled 2021-12-22 (×11): qty 1

## 2021-12-22 MED ORDER — FENTANYL CITRATE (PF) 100 MCG/2ML IJ SOLN
INTRAMUSCULAR | Status: AC
  Filled 2021-12-22: qty 2

## 2021-12-22 NOTE — Progress Notes (Signed)
Physical Therapy Discharge ?Patient Details ?Name: Shannon [name] ?MRN: [id number] ?DOB: Jul 25, 1956 ?Today's Date: 12/22/2021 ?Time:  -  ?  ? ?Patient discharged from PT services secondary to medical decline - will need to re-order PT to resume therapy services. ? ?Tresa Endo PT ?Acute Rehabilitation Services ?Pager [phone] ?Office [phone] ? ?   ? ?Murat Rideout, Shella Maxim ?12/22/2021, 6:40 AM  ?

## 2021-12-22 NOTE — Progress Notes (Signed)
OT Cancellation Note ? ?Patient Details ?Name: Shannon [name] ?MRN: [id number] ?DOB: 10/07/1955 ? ? ?Cancelled Treatment:    Reason Eval/Treat Not Completed: Medical issues which prohibited therapy ?Patient remains intubated at this time. OT to sign off at this time. Please re order when patient is able to participate in therapy.  ? ?Amari Burnsworth OTR/L, MS ?Acute Rehabilitation Department ?Office# [phone] ?Pager# [phone] ? ?12/22/2021, 6:32 AM ?

## 2021-12-22 NOTE — Progress Notes (Signed)
? ?NAME:  Shannon [name], MRN:  [id number], DOB:  24-Aug-1956, LOS: 5 ?ADMISSION DATE:  12/17/2021, CONSULTATION DATE:  12/20/2021 ?REFERRING MD:  Darliss Cheney, MD CHIEF COMPLAINT:  respiratory failure  ? ?History of Present Illness:  ?Shannon [name] is a 66 year old woman with metastatic non-small cell lung cancer in 2021 with recurrence in 09/2021, asthma/COPD, GERD, hypertension and HIV who was admitted 12/17/21 from [facility] due to right lower extremity wound which had been there over the previous month.  She had irrigation and debridement of right foot and calf wounds on 12/17/21 with cultures growing klebsiella. Blood cultures from 4/26 grew candida parapsilosis and she was started on micafungin 4/28 which was changed to fluconazole 4/29.  On 4/28 she developed swelling of her right upper extremity and was evaluated by orthopedic surgery for concern of nectrotizing fasciitis or compartment syndrome so she went for incision and drainage morning of 4/29 without overt signs of necrotizing fasciitis. She developed progressive mental status changes 4/28 to 4/29 with obtundation. She was transferred to the ICU and PCCM consulted for hypercapnic respiratory failure.  ? ?Unable to obtain further history from patient due to obtundation. I spoke with patient's niece - Shannon [name] in person and patient's sister Shannon [name] over the phone. Shannon [name] consented to move forward with intubation.  ? ?Pertinent  Medical History  ?HIV ?Asthma-COPD ?GERD ?Hypertension ?Obesity ? ?Significant Hospital Events: ?Including procedures, antibiotic start and stop dates in addition to other pertinent events   ?4/26: admitted, I&D of LLE ?4/28: started on bipap, altered mentation ?4/29: I&D of RUE, transferred to ICU and intubated ? ?Interim History / Subjective:  ?No acute events overnight ? ?Objective   ?Blood pressure (!) 146/61, pulse (!) 54, temperature 97.9 ?F (36.6 ?C), temperature source Oral, resp. rate 18, height 5\' 4"  (1.626 m), weight 107.1  kg, SpO2 100 %. ?   ?Vent Mode: PRVC ?FiO2 (%):  [30 %-40 %] 30 % ?Set Rate:  [18 bmp] 18 bmp ?Vt Set:  [440 mL] 440 mL ?PEEP:  [5 cmH20] 5 cmH20 ?Plateau Pressure:  [15 cmH20-19 cmH20] 16 cmH20  ? ?Intake/Output Summary (Last 24 hours) at 12/22/2021 0733 ?Last data filed at 12/22/2021 0706 ?Gross per 24 hour  ?Intake 2221.38 ml  ?Output 300 ml  ?Net 1921.38 ml  ? ? ?Filed Weights  ? 12/18/21 0459 12/19/21 0354 12/20/21 0435  ?Weight: 101.4 kg 105.3 kg 107.1 kg  ? ? ?Examination:  ?General: obese elderly appearing female on vent.  ?HENT: [place]/AT, PERRL, no JVD ?Lungs: Clear bilateral breath sounds, no wheeze.  ?Cardiovascular: RRR, no murmur ?Abdomen: Soft, non-tender, non-distended, hyperactive.  ?Extremities: RUE dressing in place. RLE dressing in place.  ?Neuro: Somnolent, but does arouse and follows commands. ? ?Cultures: ?Wound 4/26 Klebsiella - R to ampicillin ?Wound 4/26 Klebsiella R to ampicillin and Proteus R to Amp and ancef ?Blood 4/26 Candida parapsilosis  ? ?Resolved Hospital Problem list   ? ? ?Assessment & Plan:  ? ?Acute on Chronic Hypercapnic Respiratory Failure  ?Acute Exacerbation of COPD ?Metastatic Adenocarcinoma of the lung to bone ?In setting of sepsis due to listed infections below ?- Continue mechanical ventilation ?- Hopeful for extubation today, although ortho mentions will need to go to the OR for right lower extremity. Not sure when this would be. If today or tomorrow might make sense to leave intubated.  ?- PAD protocol ordered, propofol and PRN fentanyl ?- ICS/LABA/LAMA nebs, PRN albuterol nebs ?- IV solumedrol 40mg  daily ?- Daily WUA and SBT ? ?  Sepsis due to candida parasipsilosis bacteremia and Klebsiella soft tissue infection of right lower extremity and right upper extremity. Both with concerns for necrotizing fasciitis.  ?- Fluconazole, linezolid and Zosyn per ID ?- Follow up blood cultures and wound cultures ?- Awaiting plans per ortho ? ?Acute Metabolic and hypercapnic  Encephalopathy ?- Due to the above issues, plans as above ? ?Acute Kidney Injury on CKD IIIa ?- Will provide fluids as needed ? ?HFpEF ?Atrial Flutter ?HTN ?- Diltiazem for rate control ?- Xarelto on hold for surgical interventions ?- Lovenox therapeutic dose ?- Continue spironolactone ? ?Anemia, Acute  ?Thrombocytopenia, Acute  ?- monitor ?- transfuse for hemoglobin < 7g/dL ? ?HIV ?- Biktarvy > descovy and tivicay as can be crushed for NG tube ?- ID following ? ?DMII ?- q4hr blood sugars ?- SSI  ?- may need to adjust with adding steroids ? ?Best Practice (right click and "Reselect all SmartList Selections" daily)  ? ?Diet/type: tubefeeds ?DVT prophylaxis: LMWH ?GI prophylaxis: H2B ?Lines: Central line ?Foley:  N/A ?Code Status:  full code ?Last date of multidisciplinary goals of care discussion [4/29 discussed with patient's sister Shannon [name]] ? ?Labs   ?CBC: ?Recent Labs  ?Lab 12/17/21 ?8182 12/18/21 ?9937 12/19/21 ?1696 12/20/21 ?7893 12/21/21 ?8101 12/22/21 ?0424  ?WBC 11.5* 10.4 8.4 6.3 4.8 5.3  ?NEUTROABS 10.3*  --  7.4 5.6 4.4 4.7  ?HGB 12.5 12.2 10.8* 9.7* 9.1* 9.1*  ?HCT 39.1 39.2 35.2* 31.9* 28.8* 28.8*  ?MCV 98.2 104.0* 107.6* 106.7* 102.9* 100.7*  ?PLT 191 163 146* 129* 125* 129*  ? ? ? ?Basic Metabolic Panel: ?Recent Labs  ?Lab 12/17/21 ?7510 12/18/21 ?2585 12/19/21 ?2778 12/20/21 ?2423 12/21/21 ?5361  ?NA 139 142 140 141 142  ?K 5.3* 5.0 4.8 4.1 4.0  ?CL 99 104 106 106 107  ?CO2 34* 32 29 31 27   ?GLUCOSE 241* 124* 123* 117* 184*  ?BUN 46* 42* 34* 29* 27*  ?CREATININE 1.86* 1.58* 1.60* 1.46* 1.45*  ?CALCIUM 10.8* 10.3 9.6 9.4 9.4  ?MG  --  2.2  --   --   --   ? ? ?GFR: ?Estimated Creatinine Clearance: 45 mL/min (A) (by C-G formula based on SCr of 1.45 mg/dL (H)). ?Recent Labs  ?Lab 12/17/21 ?1120 12/17/21 ?2102 12/18/21 ?0323 12/19/21 ?0346 12/20/21 ?0458 12/20/21 ?1022 12/20/21 ?1510 12/21/21 ?4431 12/22/21 ?0424  ?WBC  --   --    < > 8.4 6.3  --   --  4.8 5.3  ?LATICACIDVEN 2.2* 1.5  --   --   --  0.6  0.6  --   --   ? < > = values in this interval not displayed.  ? ? ? ?Liver Function Tests: ?Recent Labs  ?Lab 12/17/21 ?5400 12/18/21 ?0323  ?AST 16 18  ?ALT 41 38  ?ALKPHOS 84 73  ?BILITOT 0.4 0.4  ?PROT 6.5 6.0*  ?ALBUMIN 3.4* 2.9*  ? ? ?No results for input(s): LIPASE, AMYLASE in the last 168 hours. ?No results for input(s): AMMONIA in the last 168 hours. ? ?ABG ?   ?Component Value Date/Time  ? PHART 7.43 12/20/2021 1730  ? PCO2ART 48 12/20/2021 1730  ? PO2ART 111 (H) 12/20/2021 1730  ? HCO3 31.9 (H) 12/20/2021 1730  ? TCO2 30 09/18/2016 1722  ? O2SAT 100 12/20/2021 1730  ? ?  ? ?Coagulation Profile: ?No results for input(s): INR, PROTIME in the last 168 hours. ? ?Cardiac Enzymes: ?No results for input(s): CKTOTAL, CKMB, CKMBINDEX, TROPONINI in the last 168 hours. ? ?HbA1C: ?  Hgb A1c MFr Bld  ?Date/Time Value Ref Range Status  ?12/17/2021 09:02 PM 7.3 (H) 4.8 - 5.6 % Final  ?  Comment:  ?  (NOTE) ?Pre diabetes:          5.7%-6.4% ? ?Diabetes:              >6.4% ? ?Glycemic control for   <7.0% ?adults with diabetes ?  ?09/07/2019 02:41 PM 5.7 (H) <5.7 % of total Hgb Final  ?  Comment:  ?  For someone without known diabetes, a hemoglobin  ?A1c value between 5.7% and 6.4% is consistent with ?prediabetes and should be confirmed with a  ?follow-up test. ?. ?For someone with known diabetes, a value <7% ?indicates that their diabetes is well controlled. A1c ?targets should be individualized based on duration of ?diabetes, age, comorbid conditions, and other ?considerations. ?. ?This assay result is consistent with an increased risk ?of diabetes. ?. ?Currently, no consensus exists regarding use of ?hemoglobin A1c for diagnosis of diabetes for children. ?. ?  ? ? ?CBG: ?Recent Labs  ?Lab 12/21/21 ?1545 12/21/21 ?1934 12/22/21 ?0031 12/22/21 ?8682 12/22/21 ?5749  ?[place] ? ? ?Critical care time: 48 minutes ? ?Sister Shannon [name] updated at bedside.   ? ? ?Georgann Housekeeper, AGACNP-BC ?[facility] ? ?See Amion for personal pager ?PCCM on call pager [phone] until 7pm. ?Please call Elink 7p-7a. [phone] ? ?12/22/2021 7:39 AM ? ? ? ? ? ?  ?

## 2021-12-22 NOTE — Progress Notes (Signed)
?  [facility] for Infectious Disease   ? ?Date of Admission:  12/17/2021   Total days of antibiotics 6 ?       Day 6 linezolid/piptazo ?       Day 3 fluconazole ?        ? ? ?ID: Shannon [name] is a 66 y.o. female with HIV disease, recurrent lung cancer, admitted for sepsis of SSTI/necrotizing fasciitis of right lower leg complicated respiratory failure, hypercapnic, requiring brief intubation c/b candidemia and polymicrobial wound infection. S/p I x D of right leg and fasciotomy of RUE ?Principal Problem: ?  Abscess of foot ?Active Problems: ?  HIV disease ([place]) ?  Uncontrolled hypertension ?  Morbid (severe) obesity due to excess calories ([place]) ?  Atrial flutter ([place]) ?  Chronic anticoagulation ?  (HFpEF) heart failure with preserved ejection fraction ([place]) ?  Adenocarcinoma of lung ([place]) ?  Abscess of right foot ?  Necrotizing fasciitis ([place]) ?  Gas gangrene of foot ([place]) ?  Pressure injury of skin ? ? ? ?Subjective: ?Extubated. Afebrile, alert, able to answer questions appropriately. On 3L ? ?Medications:  ? (feeding supplement) PROSource Plus  30 mL Oral BID BM  ? arformoterol  15 mcg Nebulization BID  ? vitamin C  500 mg Per Tube BID  ? [START ON 12/23/2021] bictegravir-emtricitabine-tenofovir AF  1 tablet Oral Daily  ? budesonide (PULMICORT) nebulizer solution  0.5 mg Nebulization BID  ? chlorhexidine  15 mL Mouth Rinse BID  ? Chlorhexidine Gluconate Cloth  6 each Topical Daily  ? diltiazem  60 mg Oral Q6H  ? enoxaparin (LOVENOX) injection  100 mg Subcutaneous Q12H  ? famotidine  40 mg Per Tube Daily  ? feeding supplement  237 mL Oral BID BM  ? fentaNYL      ? fentaNYL (SUBLIMAZE) injection  50 mcg Intravenous Once  ? insulin aspart  0-15 Units Subcutaneous Q4H  ? mouth rinse  15 mL Mouth Rinse q12n4p  ? methylPREDNISolone (SOLU-MEDROL) injection  40 mg Intravenous Daily  ? midazolam      ? multivitamin  15 mL Oral Daily  ? [START ON 12/23/2021] polyethylene glycol  17 g Oral Daily  ? QUEtiapine  25 mg Oral  QHS  ? revefenacin  175 mcg Nebulization Daily  ? sodium chloride flush  10-40 mL Intracatheter Q12H  ? [START ON 12/23/2021] spironolactone  25 mg Oral Daily  ? zinc sulfate  220 mg Per Tube Daily  ? ? ?Objective: ?Vital signs in last 24 hours: ?Temp:  [97.1 ?F (36.2 ?C)-98.3 ?F (36.8 ?C)] 98 ?F (36.7 ?C) (05/01 1111) ?Pulse Rate:  [53-80] 80 (05/01 1111) ?Resp:  [17-29] 24 (05/01 1111) ?BP: (105-165)/(41-79) 145/62 (05/01 1111) ?SpO2:  [97 %-100 %] 100 % (05/01 1111) ?FiO2 (%):  [30 %] 30 % (05/01 1111) ? ?Physical Exam  ?Constitutional:  oriented to person, place, and time. appears well-developed and well-nourished. No distress.  ?HENT: [place]/AT, PERRLA, no scleral icterus ?Mouth/Throat: Oropharynx is clear and moist. No oropharyngeal exudate.  ?Cardiovascular: Normal rate, regular rhythm and normal heart sounds. Exam reveals no gallop and no friction rub.  ?No murmur heard.  ?Pulmonary/Chest: Effort normal and breath sounds normal. No respiratory distress.  has no wheezes.  ?Neck = supple, no nuchal rigidity ?Abdominal: Soft. Bowel sounds are normal.  exhibits no distension. There is no tenderness.  ?Lymphadenopathy: no cervical adenopathy. No axillary adenopathy ?Neurological: alert and oriented to person, place, and time.  ?Skin: right upper extremity and  right leg are wrapped ?Ext: + 1 pitting edema ?Psychiatric: a normal mood and affect.  behavior is normal.  ? ? ?Lab Results ?Recent Labs  ?  12/20/21 ?0458 12/21/21 ?0272 12/22/21 ?0424  ?WBC 6.3 4.8 5.3  ?HGB 9.7* 9.1* 9.1*  ?HCT 31.9* 28.8* 28.8*  ?NA 141 142  --   ?K 4.1 4.0  --   ?CL 106 107  --   ?CO2 31 27  --   ?BUN 29* 27*  --   ?CREATININE 1.46* 1.45*  --   ? ? ? ?Microbiology: ?4/29 right arm - ngtd ?4/26 leg cx - proteus, kleb pneumonaie, and e.faecalis ?Klebsiella pneumoniae Proteus species     ? MIC MIC   ? AMPICILLIN >=32 RESIST... Resistant >=32 RESIST... Resistant   ? AMPICILLIN/SULBACTAM 8 SENSITIVE  Sensitive 8 SENSITIVE  Sensitive   ? CEFAZOLIN  <=4 SENSITIVE  Sensitive >=64 RESIST... Resistant   ? CEFEPIME <=0.12 SENS... Sensitive <=0.12 SENS... Sensitive   ? CEFTAZIDIME <=1 SENSITIVE  Sensitive <=1 SENSITIVE  Sensitive   ? CEFTRIAXONE <=0.25 SENS... Sensitive <=0.25 SENS... Sensitive   ? CIPROFLOXACIN <=0.25 SENS... Sensitive <=0.25 SENS... Sensitive   ? GENTAMICIN <=1 SENSITIVE  Sensitive <=1 SENSITIVE  Sensitive   ? IMIPENEM <=0.25 SENS... Sensitive 2 SENSITIVE  Sensitive   ? PIP/TAZO <=4 SENSITIVE  Sensitive <=4 SENSITIVE  Sensitive   ? TRIMETH/SULFA <=20 SENSIT... Sensitive <=20 SENSIT... Sensitive   ? ?Studies/Results: ?DG Abd 1 View ? ?Result Date: 12/20/2021 ?CLINICAL DATA:  Feeding tube placement. EXAM: ABDOMEN - 1 VIEW COMPARISON:  None. FINDINGS: A nasogastric tube is seen with its distal tip overlying the expected region of the gastric antrum. The bowel gas pattern is normal. No radio-opaque calculi or other significant radiographic abnormality are seen. An 8 mm soft tissue calcification is seen projecting over the mid left abdomen. This is confirmed to be extrarenal in location upon review of prior abdominal CT studies. IMPRESSION: Nasogastric tube positioning, as described above. Electronically Signed   By: Virgina Norfolk M.D.   On: 12/20/2021 18:14  ? ?DG CHEST PORT 1 VIEW ? ?Result Date: 12/20/2021 ?CLINICAL DATA:  The endotracheal tube and feeding tube placement. EXAM: PORTABLE CHEST 1 VIEW COMPARISON:  December 20, 2021 FINDINGS: An endotracheal tube is seen with its distal tip approximately 2.5 cm from the carina. A nasogastric tube is noted with its distal end extending below the level of the diaphragm. There is stable left-sided PICC line positioning. The heart size and mediastinal contours are within normal limits. Mild atelectatic changes are seen within the bilateral lung bases. There is no evidence of a pleural effusion or pneumothorax. Radiopaque surgical clips are seen overlying the left lung base. Multilevel degenerative changes  are seen throughout the thoracic spine. IMPRESSION: 1. Interval endotracheal tube placement and positioning, as described above. 2. Mild bibasilar atelectasis. Electronically Signed   By: Virgina Norfolk M.D.   On: 12/20/2021 18:11   ? ? ?Assessment/Plan: ?Polymicrobial skin infection/necrotizing fascitis of right leg = will change to amp/sub to cover all pathogens and discontinue linezolid ? ?Hiv disease = will change back to biktarvy; will discuss oi prophylaxis once ready for discharge ? ?Respiratory failure = now on 3L [place] ? ?Candidemia = repeat blood cx drawn today to see that she is clearing bacteremia. Continue with fluconazole ? ?[name] ?[facility] for [facility] ?Pager: [phone] ? ?12/22/2021, 3:40 PM ? ? ? ? ? ?

## 2021-12-22 NOTE — Progress Notes (Signed)
ANTICOAGULATION CONSULT NOTE - Follow Up Consult ? ?Pharmacy Consult for Lovenox ?Indication: h/o afib and PE ? ?Allergies  ?Allergen Reactions  ? Lisinopril Swelling and Cough  ?  Face/throat swelling  ? Tree Extract Swelling and Other (See Comments)  ?  Swelling to eyes  ? Augmentin [Amoxicillin-Pot Clavulanate] Other (See Comments)  ?  Headache, dizzy  ? Ciprofloxacin Hives  ? ? ?Patient Measurements: ?Height: 5\' 4"  (162.6 cm) ?Weight: 107.1 kg (236 lb 1.8 oz) ?IBW/kg (Calculated) : 54.7 ? ?Vital Signs: ?Temp: 97.9 ?F (36.6 ?C) (05/01 0430) ?Temp Source: Oral (05/01 0430) ?BP: 146/61 (05/01 0600) ?Pulse Rate: 54 (05/01 0600) ? ?Labs: ?Recent Labs  ?  12/20/21 ?0458 12/21/21 ?3716 12/22/21 ?0424  ?HGB 9.7* 9.1* 9.1*  ?HCT 31.9* 28.8* 28.8*  ?PLT 129* 125* 129*  ?CREATININE 1.46* 1.45*  --   ? ? ? ?Estimated Creatinine Clearance: 45 mL/min (A) (by C-G formula based on SCr of 1.45 mg/dL (H)). ? ? ?Assessment: ?AC/Heme: hx afib and PE on xarelto 20 mg daily PTA (LD 4/25 AM)>>Lovenox  ?Hgb 9.1, low but stable ?Plts 129, low but stable ?No reported bleeding ?SCr 1.45 on 4/20, eCrCl 45 ml/min ? ? ?Goal of Therapy:  ?Anti-Xa level 0.6-1 units/ml 4hrs after LMWH dose given ?Monitor platelets by anticoagulation protocol: Yes ?  ?Plan:  ?Continue Lovenox 100 mg Q12 ?Follow up when to hold Lovenox in preparation for pt's next surgical intervention per ortho ?F/u restart of PTA Xarelto when no more plans for OR ?Low Hgb and Plts - ? Due to pt's underlying disease states vs antibiotic related vs anticoag related ? ? ?Adrian Saran, PharmD, BCPS ?Secure Chat if ?s ?12/22/2021 8:12 AM ? ? ? ?

## 2021-12-22 NOTE — Procedures (Signed)
Extubation Procedure Note ? ?Patient Details:   ?Name: Shannon [name] ?DOB: 05-21-1956 ?MRN: [id number] ?  ?Airway Documentation:  ?  ?Vent end date: 12/22/21 Vent end time: 1134  ? ?Evaluation ? O2 sats: stable throughout ?Complications: No apparent complications ?Patient did tolerate procedure well. ?Bilateral Breath Sounds: Clear, Diminished ?  ?Yes ? ?Rhett Bannister S ?12/22/2021, 11:36 AM ? ?

## 2021-12-23 ENCOUNTER — Other Ambulatory Visit: Payer: Medicare Other

## 2021-12-23 ENCOUNTER — Inpatient Hospital Stay: Payer: Medicare Other

## 2021-12-23 ENCOUNTER — Encounter: Payer: Medicare Other | Admitting: Internal Medicine

## 2021-12-23 ENCOUNTER — Institutional Professional Consult (permissible substitution): Payer: Medicare Other | Admitting: Plastic Surgery

## 2021-12-23 ENCOUNTER — Inpatient Hospital Stay: Payer: Medicare Other | Admitting: Internal Medicine

## 2021-12-23 DIAGNOSIS — J9602 Acute respiratory failure with hypercapnia: Secondary | ICD-10-CM | POA: Diagnosis not present

## 2021-12-23 DIAGNOSIS — I5033 Acute on chronic diastolic (congestive) heart failure: Secondary | ICD-10-CM

## 2021-12-23 DIAGNOSIS — A480 Gas gangrene: Secondary | ICD-10-CM | POA: Diagnosis not present

## 2021-12-23 DIAGNOSIS — L02611 Cutaneous abscess of right foot: Secondary | ICD-10-CM | POA: Diagnosis not present

## 2021-12-23 DIAGNOSIS — L02619 Cutaneous abscess of unspecified foot: Secondary | ICD-10-CM | POA: Diagnosis not present

## 2021-12-23 LAB — GLUCOSE, CAPILLARY
Glucose-Capillary: 105 mg/dL — ABNORMAL HIGH (ref 70–99)
Glucose-Capillary: 134 mg/dL — ABNORMAL HIGH (ref 70–99)
Glucose-Capillary: 135 mg/dL — ABNORMAL HIGH (ref 70–99)
Glucose-Capillary: 162 mg/dL — ABNORMAL HIGH (ref 70–99)
Glucose-Capillary: 208 mg/dL — ABNORMAL HIGH (ref 70–99)
Glucose-Capillary: 251 mg/dL — ABNORMAL HIGH (ref 70–99)
Glucose-Capillary: 97 mg/dL (ref 70–99)

## 2021-12-23 LAB — CBC WITH DIFFERENTIAL/PLATELET
Abs Immature Granulocytes: 0.07 10*3/uL (ref 0.00–0.07)
Basophils Absolute: 0 10*3/uL (ref 0.0–0.1)
Basophils Relative: 0 %
Eosinophils Absolute: 0 10*3/uL (ref 0.0–0.5)
Eosinophils Relative: 0 %
HCT: 26.5 % — ABNORMAL LOW (ref 36.0–46.0)
Hemoglobin: 8.3 g/dL — ABNORMAL LOW (ref 12.0–15.0)
Immature Granulocytes: 1 %
Lymphocytes Relative: 7 %
Lymphs Abs: 0.4 10*3/uL — ABNORMAL LOW (ref 0.7–4.0)
MCH: 31.8 pg (ref 26.0–34.0)
MCHC: 31.3 g/dL (ref 30.0–36.0)
MCV: 101.5 fL — ABNORMAL HIGH (ref 80.0–100.0)
Monocytes Absolute: 0.2 10*3/uL (ref 0.1–1.0)
Monocytes Relative: 4 %
Neutro Abs: 4.9 10*3/uL (ref 1.7–7.7)
Neutrophils Relative %: 88 %
Platelets: 135 10*3/uL — ABNORMAL LOW (ref 150–400)
RBC: 2.61 MIL/uL — ABNORMAL LOW (ref 3.87–5.11)
RDW: 13.8 % (ref 11.5–15.5)
WBC: 5.6 10*3/uL (ref 4.0–10.5)
nRBC: 0.5 % — ABNORMAL HIGH (ref 0.0–0.2)

## 2021-12-23 LAB — BASIC METABOLIC PANEL
Anion gap: 6 (ref 5–15)
BUN: 37 mg/dL — ABNORMAL HIGH (ref 8–23)
CO2: 26 mmol/L (ref 22–32)
Calcium: 9.5 mg/dL (ref 8.9–10.3)
Chloride: 110 mmol/L (ref 98–111)
Creatinine, Ser: 1.42 mg/dL — ABNORMAL HIGH (ref 0.44–1.00)
GFR, Estimated: 41 mL/min — ABNORMAL LOW (ref >60)
Glucose, Bld: 129 mg/dL — ABNORMAL HIGH (ref 70–99)
Potassium: 3.8 mmol/L (ref 3.5–5.1)
Sodium: 142 mmol/L (ref 135–145)

## 2021-12-23 LAB — SURGICAL PATHOLOGY

## 2021-12-23 LAB — AEROBIC/ANAEROBIC CULTURE W GRAM STAIN (SURGICAL/DEEP WOUND)

## 2021-12-23 MED ORDER — INSULIN ASPART 100 UNIT/ML IJ SOLN
0.0000 [IU] | Freq: Three times a day (TID) | INTRAMUSCULAR | Status: DC
  Administered 2021-12-23 – 2021-12-24 (×2): 5 [IU] via SUBCUTANEOUS
  Administered 2021-12-24: 3 [IU] via SUBCUTANEOUS
  Administered 2021-12-24: 2 [IU] via SUBCUTANEOUS
  Administered 2021-12-25: 5 [IU] via SUBCUTANEOUS
  Administered 2021-12-25: 3 [IU] via SUBCUTANEOUS
  Administered 2021-12-26: 2 [IU] via SUBCUTANEOUS
  Administered 2021-12-27: 5 [IU] via SUBCUTANEOUS
  Administered 2021-12-27: 3 [IU] via SUBCUTANEOUS
  Administered 2021-12-28 (×2): 2 [IU] via SUBCUTANEOUS
  Administered 2021-12-28: 3 [IU] via SUBCUTANEOUS
  Administered 2021-12-29 (×2): 5 [IU] via SUBCUTANEOUS
  Administered 2021-12-30 (×2): 3 [IU] via SUBCUTANEOUS
  Administered 2021-12-31: 5 [IU] via SUBCUTANEOUS
  Administered 2022-01-01: 2 [IU] via SUBCUTANEOUS
  Administered 2022-01-01: 3 [IU] via SUBCUTANEOUS
  Administered 2022-01-02: 5 [IU] via SUBCUTANEOUS
  Administered 2022-01-02: 3 [IU] via SUBCUTANEOUS
  Administered 2022-01-02: 2 [IU] via SUBCUTANEOUS
  Administered 2022-01-03 – 2022-01-06 (×12): 3 [IU] via SUBCUTANEOUS
  Administered 2022-01-07: 5 [IU] via SUBCUTANEOUS
  Administered 2022-01-07 (×2): 3 [IU] via SUBCUTANEOUS
  Administered 2022-01-08: 2 [IU] via SUBCUTANEOUS
  Administered 2022-01-08 – 2022-01-09 (×5): 3 [IU] via SUBCUTANEOUS
  Administered 2022-01-10: 5 [IU] via SUBCUTANEOUS
  Administered 2022-01-10: 2 [IU] via SUBCUTANEOUS
  Administered 2022-01-10: 3 [IU] via SUBCUTANEOUS
  Administered 2022-01-11: 2 [IU] via SUBCUTANEOUS
  Administered 2022-01-11 (×2): 3 [IU] via SUBCUTANEOUS
  Administered 2022-01-12: 5 [IU] via SUBCUTANEOUS
  Administered 2022-01-12: 3 [IU] via SUBCUTANEOUS
  Administered 2022-01-12 – 2022-01-13 (×2): 2 [IU] via SUBCUTANEOUS
  Administered 2022-01-13 (×2): 3 [IU] via SUBCUTANEOUS
  Administered 2022-01-14: 5 [IU] via SUBCUTANEOUS
  Administered 2022-01-14: 2 [IU] via SUBCUTANEOUS

## 2021-12-23 MED ORDER — METOPROLOL TARTRATE 25 MG PO TABS
25.0000 mg | ORAL_TABLET | Freq: Two times a day (BID) | ORAL | Status: DC
  Administered 2021-12-23 – 2021-12-24 (×3): 25 mg via ORAL
  Filled 2021-12-23 (×3): qty 1

## 2021-12-23 MED ORDER — OXYCODONE HCL 5 MG PO TABS
5.0000 mg | ORAL_TABLET | Freq: Four times a day (QID) | ORAL | Status: DC | PRN
  Administered 2021-12-23 – 2021-12-25 (×3): 5 mg via ORAL
  Filled 2021-12-23 (×3): qty 1

## 2021-12-23 MED ORDER — SODIUM CHLORIDE 0.9 % IV SOLN: INTRAVENOUS | Status: DC | PRN

## 2021-12-23 MED ORDER — ASCORBIC ACID 500 MG PO TABS
500.0000 mg | ORAL_TABLET | Freq: Two times a day (BID) | ORAL | Status: DC
  Administered 2021-12-23 – 2021-12-25 (×5): 500 mg via ORAL
  Filled 2021-12-23 (×6): qty 1

## 2021-12-23 MED ORDER — INSULIN ASPART 100 UNIT/ML IJ SOLN
0.0000 [IU] | Freq: Every day | INTRAMUSCULAR | Status: DC
  Administered 2021-12-28: 3 [IU] via SUBCUTANEOUS
  Administered 2021-12-31 – 2022-01-02 (×2): 2 [IU] via SUBCUTANEOUS
  Administered 2022-01-04: 3 [IU] via SUBCUTANEOUS
  Administered 2022-01-07 – 2022-01-09 (×3): 2 [IU] via SUBCUTANEOUS

## 2021-12-23 MED ORDER — ZINC SULFATE 220 (50 ZN) MG PO CAPS
220.0000 mg | ORAL_CAPSULE | Freq: Every day | ORAL | Status: DC
Start: 2021-12-24 — End: 2021-12-27
  Administered 2021-12-24 – 2021-12-25 (×2): 220 mg via ORAL
  Filled 2021-12-23 (×2): qty 1

## 2021-12-23 NOTE — Progress Notes (Signed)
?  [facility] for Infectious Disease   ? ?Date of Admission:  12/17/2021   Total days of antibiotics 7 ?        ? ? ?ID: Shannon [name] is a 66 y.o. female with  sepsis from SSTI/necrotizing fasciitis to right lower leg c/b fungemia and acute respiratory failure 2/2 hypercapnia requiring intubation s/p extubated ?Principal Problem: ?  Abscess of foot ?Active Problems: ?  HIV disease ([place]) ?  Uncontrolled hypertension ?  Morbid (severe) obesity due to excess calories ([place]) ?  Atrial flutter ([place]) ?  Chronic anticoagulation ?  (HFpEF) heart failure with preserved ejection fraction ([place]) ?  Adenocarcinoma of lung ([place]) ?  Abscess of right foot ?  Necrotizing fasciitis ([place]) ?  Gas gangrene of foot ([place]) ?  Pressure injury of skin ?  Fungemia ?  Acute respiratory failure with hypercapnia ([facility]) ? ? ? ?Subjective: ?Afebrile, but still having pain to right leg.   ? ?Medications:  ? (feeding supplement) PROSource Plus  30 mL Oral BID BM  ? arformoterol  15 mcg Nebulization BID  ? vitamin C  500 mg Oral BID  ? bictegravir-emtricitabine-tenofovir AF  1 tablet Oral Daily  ? budesonide (PULMICORT) nebulizer solution  0.5 mg Nebulization BID  ? chlorhexidine  15 mL Mouth Rinse BID  ? Chlorhexidine Gluconate Cloth  6 each Topical Daily  ? diltiazem  60 mg Oral Q6H  ? enoxaparin (LOVENOX) injection  100 mg Subcutaneous Q12H  ? famotidine  40 mg Per Tube Daily  ? feeding supplement  237 mL Oral BID BM  ? fentaNYL (SUBLIMAZE) injection  50 mcg Intravenous Once  ? insulin aspart  0-15 Units Subcutaneous TID WC  ? insulin aspart  0-5 Units Subcutaneous QHS  ? mouth rinse  15 mL Mouth Rinse q12n4p  ? methylPREDNISolone (SOLU-MEDROL) injection  40 mg Intravenous Daily  ? metoprolol tartrate  25 mg Oral BID  ? multivitamin  15 mL Oral Daily  ? polyethylene glycol  17 g Oral Daily  ? QUEtiapine  25 mg Oral QHS  ? revefenacin  175 mcg Nebulization Daily  ? sodium chloride flush  10-40 mL Intracatheter Q12H  ? spironolactone  25 mg  Oral Daily  ? [START ON 12/24/2021] zinc sulfate  220 mg Oral Daily  ? ? ?Objective: ?Vital signs in last 24 hours: ?Temp:  [98 ?F (36.7 ?C)-98.9 ?F (37.2 ?C)] 98 ?F (36.7 ?C) (05/02 1200) ?Pulse Rate:  [69-103] 92 (05/02 1300) ?Resp:  [16-39] 28 (05/02 1300) ?BP: (149-201)/(65-103) 168/65 (05/02 1300) ?SpO2:  [94 %-100 %] 97 % (05/02 1300) ?Weight:  [109 kg] 109 kg (05/02 0630) ?Physical Exam  ?Constitutional:  oriented to person, place, and time. appears well-developed and well-nourished. No distress.  ?HENT: [place]/AT, PERRLA, no scleral icterus ?Mouth/Throat: Oropharynx is clear and moist. No oropharyngeal exudate.  ?Cardiovascular: Normal rate, regular rhythm and normal heart sounds. Exam reveals no gallop and no friction rub.  ?No murmur heard.  ?Pulmonary/Chest: Effort normal and breath sounds normal. No respiratory distress.  has no wheezes.  ?Neck = supple, no nuchal rigidity ?Abdominal: Soft. Bowel sounds are normal.  exhibits no distension. There is no tenderness.  ?Ext= pitting edema, right leg wrapping ?Skin: Skin is warm and dry. + right lower extremity is wrapped. Some small bullae on RUE. ?Psychiatric: a normal mood and affect.  behavior is normal.  ? ? ?Lab Results ?Recent Labs  ?  12/21/21 ?2952 12/22/21 ?0424 12/23/21 ?0408 12/23/21 ?1114  ?WBC 4.8  5.3 5.6  --   ?HGB 9.1* 9.1* 8.3*  --   ?HCT 28.8* 28.8* 26.5*  --   ?NA 142  --   --  142  ?K 4.0  --   --  3.8  ?CL 107  --   --  110  ?CO2 27  --   --  26  ?BUN 27*  --   --  37*  ?CREATININE 1.45*  --   --  1.42*  ? ? ?Microbiology: ?5/1 blood cx- ? ?Studies/Results: ?No results found. ? ? ?Assessment/Plan: ?Necrotizing fasciitis, s/p I X D = evaluated by orthopedics who recommend AKA. Continue on amp/sub for now ? ?Candidemia = thought to be due to wound infection, -translocation. Once stable, will need TEE to rule out endocarditis. She had repeat blood cx to document clearance. No visual changes ? ?Hiv disease = continue on biktarvy ? ?[name] ?[facility] for Infectious Diseases ?Pager: [phone] ? ?12/23/2021, 1:45 PM ? ? ? ? ? ?

## 2021-12-23 NOTE — H&P (View-Only) (Signed)
Patient ID: Shannon [name], female   DOB: 10-24-1955, 66 y.o.   MRN: [id number] ?Patient is seen in follow-up for her right lower extremity with gangrenous ulcers and necrotizing fasciitis.  Patient has had progressive ulceration and progressive gangrenous changes to the right lower extremity.  She has pain to palpation of the proximal calf.  Patient's thigh is soft nontender to palpation no evidence of infection in the thigh.  I discussed with the patient and her sister at bedside recommendation to proceed with an above-the-knee amputation.  Patient does not have sufficient soft tissue below the knee to proceed with a below the knee amputation. ? ?Patient's sister states that she would like to discuss this with family and would like to proceed with surgery on Friday.  She has my phone number and will call with any questions. ? ?Plan for right above-knee amputation at [facility] on Friday. ?

## 2021-12-23 NOTE — Evaluation (Signed)
Physical Therapy Evaluation ?Patient Details ?Name: Shannon [name] ?MRN: [id number] ?DOB: [birth year] ?Today's Date: 12/23/2021 ? ?History of Present Illness ? Shannon [name] is a 66 year old woman with metastatic non-small cell lung cancer, asthma/COPD, GERD, hypertension and HIV who was admitted 12/17/21  due to right lower extremity wound  S/P irrigation and debridement of right foot and calf wounds on 12/17/21 .  On 4/28 she developed swelling of her right upper extremity and went for incision and drainage morning of 4/29 without overt signs of necrotizing fasciitis. She developed progressive mental status changes 4/28 to 4/29 with obtundation. She was transferred to the ICU and PCCM consulted for hypercapnic respiratory failure.    Intubated 4/29 and extubated 12/22/21  ?Clinical Impression ? Patient requesting to mobilize to sitting. Patient required extra time and care for moving the right leg, expressing pain at times. Patient gradually assisted to sitting on bed edge for ~ 5 minutes. Patient  able to move both legs for positioning in bed. Unable to attempt any Wb on the right leg at this time. Patient will require a mechanical lift  for OOB.[name]  ?VS stable  with RR increased when moving.required  frequent rest breaks . SPO2 on 4 L > 92%,  ?Pt currently with functional limitations due to the deficits listed below (see PT Problem List). Pt will benefit from skilled PT to increase their independence and safety with mobility to allow discharge to the venue listed below.   ?   ?   ? ?Recommendations for follow up therapy are one component of a multi-disciplinary discharge planning process, led by the attending physician.  Recommendations may be updated based on patient status, additional functional criteria and insurance authorization. ? ?Follow Up Recommendations Skilled nursing-short term rehab (<3 hours/day) ? ?  ?Assistance Recommended at Discharge Frequent or constant Supervision/Assistance  ?Patient can return home  with the following ? Two people to help with walking and/or transfers;A lot of help with bathing/dressing/bathroom;Assistance with cooking/housework;Assist for transportation;Help with stairs or ramp for entrance ? ?  ?Equipment Recommendations None recommended by PT  ?Recommendations for Other Services ?    ?  ?Functional Status Assessment Patient has had a recent decline in their functional status and demonstrates the ability to make significant improvements in function in a reasonable and predictable amount of time.  ? ?  ?Precautions / Restrictions Precautions ?Precautions: Fall ?Precaution Comments: RLE painful to touch, monitor  Sats ?Required Braces or Orthoses: Splint/Cast ?Splint/Cast: has a darco shoe inpackage in room.orderd by ortho post I and D of right leg. ?Restrictions ?Weight Bearing Restrictions: No  ? ?  ? ?Mobility ? Bed Mobility ?Overal bed mobility: Needs Assistance ?Bed Mobility: Supine to Sit, Sit to Supine ?  ?  ?Supine to sit: Mod assist, HOB elevated ?Sit to supine: Mod assist, HOB elevated ?  ?General bed mobility comments: extra time and support for the right leg, patient moved left leg,  patient gradually able to scoot  self around to sit  on bed edge with mod assist for right leg support throughout. Slow to return to supine with patient lifting both legs to return  onto bed. ?  ? ?Transfers ?  ?  ?  ?  ?  ?  ?  ?  ?  ?General transfer comment: will need a maxisky ?  ? ?Ambulation/Gait ?  ?  ?  ?  ?  ?  ?  ?  ? ?Stairs ?  ?  ?  ?  ?  ? ?  Wheelchair Mobility ?  ? ?Modified Rankin (Stroke Patients Only) ?  ? ?  ? ?Balance Overall balance assessment: Needs assistance ?Sitting-balance support: Feet unsupported, Single extremity supported ?Sitting balance-Leahy Scale: Fair ?Sitting balance - Comments: Right leg supported by therapuist. patient somewhat propped against the [facility] raised ?  ?  ?  ?  ?  ?  ?  ?  ?  ?  ?  ?  ?  ?  ?  ?   ? ? ? ?Pertinent Vitals/Pain Pain Assessment ?Pain Assessment:  Faces ?Faces Pain Scale: Hurts whole lot ?Pain Location: bilateral legs 7/10 ?Pain Descriptors / Indicators: Discomfort, Grimacing, Guarding, Moaning ?Pain Intervention(s): Limited activity within patient's tolerance, Monitored during session, Premedicated before session, Repositioned  ? ? ?Home Living Family/patient expects to be discharged to:: Private residence ?Living Arrangements: Alone ?Available Help at Discharge: Family;Available PRN/intermittently ?Type of Home: House ?Home Access: Stairs to enter ?  ?Entrance Stairs-Number of Steps: back door none, front door 8 ?Alternate Level Stairs-Number of Steps: flight with landing ?Home Layout: Two level;Bed/bath upstairs ?Home Equipment: Conservation officer, nature (2 wheels) ?   ?  ?Prior Function Prior Level of Function : Independent/Modified Independent ?  ?  ?  ?  ?  ?  ?  ?  ?  ? ? ?Hand Dominance  ? Dominant Hand: Right ? ?  ?Extremity/Trunk Assessment  ?   ?  ? ?Lower Extremity Assessment ?Lower Extremity Assessment: RLE deficits/detail;LLE deficits/detail ?RLE Deficits / Details: dressing intact. Patient able to lift leg to slide across bed, at times, therapist/sister used bed pad under leg to assist with moving the leg. Tolerated ~ 40 * knee flexion when seated on bed edge ?RLE: Unable to fully assess due to pain ?LLE Deficits / Details: lifts leg from bed, Knee flexed to 80 seated ?  ? ?Cervical / Trunk Assessment ?Cervical / Trunk Assessment: Normal  ?Communication  ? Communication: No difficulties  ?Cognition Arousal/Alertness: Awake/alert ?Behavior During Therapy: WFL for tasks assessed/performed, Restless, Anxious ?Overall Cognitive Status: Impaired/Different from baseline ?Area of Impairment: Orientation ?  ?  ?  ?  ?  ?  ?  ?  ?Orientation Level: Time ?  ?  ?  ?  ?  ?  ?General Comments: anxious about moving the right leg,  follows directions with extra time. ?  ?  ? ?  ?General Comments   ? ?  ?Exercises    ? ?Assessment/Plan  ?  ?PT Assessment Patient needs  continued PT services  ?PT Problem List Decreased strength;Decreased mobility;Decreased safety awareness;Decreased range of motion;Decreased knowledge of precautions;Obesity;Decreased activity tolerance;Decreased balance;Pain ? ?   ?  ?PT Treatment Interventions DME instruction;Therapeutic activities;Gait training;Therapeutic exercise;Patient/family education;Functional mobility training   ? ?PT Goals (Current goals can be found in the Care Plan section)  ?Acute Rehab PT Goals ?Patient Stated Goal: to get up ?PT Goal Formulation: With patient/family ?Time For Goal Achievement: 01/06/22 ?Potential to Achieve Goals: Fair ? ?  ?Frequency Min 2X/week ?  ? ? ?Co-evaluation   ?  ?  ?  ?  ? ? ?  ?AM-PAC PT "6 Clicks" Mobility  ?Outcome Measure Help needed turning from your back to your side while in a flat bed without using bedrails?: A Lot ?Help needed moving from lying on your back to sitting on the side of a flat bed without using bedrails?: A Lot ?Help needed moving to and from a bed to a chair (including a wheelchair)?: Total ?Help needed standing up from a  chair using your arms (e.g., wheelchair or bedside chair)?: Total ?Help needed to walk in hospital room?: Total ?Help needed climbing 3-5 steps with a railing? : Total ?6 Click Score: 8 ? ?  ?End of Session Equipment Utilized During Treatment: Oxygen ?Activity Tolerance: Patient tolerated treatment well ?Patient left: in bed;with call bell/phone within reach;with family/visitor present ?Nurse Communication: Mobility status ?PT Visit Diagnosis: Unsteadiness on feet (R26.81);Pain ?Pain - Right/Left: Right ?Pain - part of body: Leg ?  ? ?Time: 0820-0850 ?PT Time Calculation (min) (ACUTE ONLY): 30 min ? ? ?Charges:   PT Evaluation ?$PT Eval Low Complexity: 1 Low ?  ?  ?   ? ? ?Tresa Endo PT ?Acute Rehabilitation Services ?Pager [phone] ?Office [phone] ? ? ?Claretha Cooper ?12/23/2021, 10:15 AM ? ?

## 2021-12-23 NOTE — Plan of Care (Signed)
Discussed with patient plan of care for the evening, pain management and severity of disease with leg with some teach back displayed ? ?Problem: Education: ?Goal: Knowledge of disease or condition will improve ?Outcome: Progressing ?  ?

## 2021-12-23 NOTE — Progress Notes (Signed)
?PROGRESS NOTE ? ? ? ?Shannon [name]  XVQ:[id number] DOB: [birth year] DOA: 12/17/2021 ?PCP: [name], MD  ? ? ?Brief Narrative:  ?66 year old female with a history of metastatic adenocarcinoma of lung, COPD, HIV, was admitted to the hospital with right lower extremity wound.  She was seen by orthopedics and underwent incision and drainage.  He was noted to have positive fungal cultures indicating fungemia.  Infectious disease following for antibiotics/antifungals.  Overall, her condition worsen with worsening mental status and respiratory status.  She required intubation and transfer to ICU.  She was treated for COPD exacerbation and since then, respiratory status has stabilized and she was extubated on 5/1.  Orthopedics following for gas gangrene/necrotizing fasciitis of her right lower extremity and plans are for amputation later this week.  She will be discharged to skilled nursing facility when she is medically stabilized.  She was transferred to [facility] service on 5/2 ? ? ?Assessment & Plan: ?  ?Principal Problem: ?  Abscess of foot ?Active Problems: ?  HIV disease ([place]) ?  Uncontrolled hypertension ?  Morbid (severe) obesity due to excess calories ([place]) ?  Atrial flutter ([place]) ?  Chronic respiratory failure with hypoxia (HCC) ?  Chronic anticoagulation ?  (HFpEF) heart failure with preserved ejection fraction ([place]) ?  Adenocarcinoma of lung ([place]) ?  Abscess of right foot ?  Necrotizing fasciitis ([place]) ?  Gas gangrene of foot ([place]) ?  Pressure injury of skin ?  Fungemia ?  Acute respiratory failure with hypercapnia (HCC) ? ? ?Acute on chronic respiratory failure with hypoxia and hypercapnia ?-Chronically uses oxygen at home as needed ?-Due to worsening respiratory status, did require intubation on 4/29.  She was able to extubate on 5/1 ?-She continues on BiPAP nightly ?-Wean off oxygen as tolerated ? ?Gas gangrene and necrotizing fasciitis of right lower extremity ?-She has already undergone incision and  drainage of the right lower extremity ?-Orthopedic following and plans on amputation of the right lower extremity later this week ?-She is continued on IV antibiotics ? ?HIV disease ?-Continued on Biktarvy ? ?Fungemia ?-Infectious disease following for antifungals ? ?COPD exacerbation ?-She is continued on bronchodilators ?-Currently on daily Solu-Medrol ?-Plan on tapering steroids ? ?Chronic diastolic congestive heart failure ?-Currently appears compensated ? ?Permanent atrial flutter/fibrillation ?-She is on diltiazem for rate control ?-We will also add metoprolol ?-Currently anticoagulated with full dose Lovenox ?-Consider resuming Xarelto once surgical procedures are completed ? ?Type 2 diabetes ?-Continue on sliding scale insulin ?-Blood sugars have been stable ? ?Hypertension ?-Blood pressures currently elevated ?-Currently on diltiazem ?-Added metoprolol today ? ?AKI on CKD stage IIIa ?-Baseline creatinine 1.1 ?-Creatinine peaked at 1.8 ?-Currently creatinine 1.4 ?-Continue to monitor urine output ? ?Metastatic adenocarcinoma of the lung to bone ?-Continue follow-up with oncology ? ?Morbid obesity ?-BMI 41.2 ? ?Generalized weakness/deconditioning ?-We will need skilled nursing facility placement on discharge ? ? ? ? ?DVT prophylaxis: SCD's Start: 12/20/21 1327, therapeutic Lovenox ? ?Code Status: Full code ?Family Communication: Updated patient's sister at the bedside ?Disposition Plan: Status is: Inpatient ?Remains inpatient appropriate because: Further operative management of lower extremity infection ? ? ? ? ?Consultants:  ?Orthopedics ?Ortho-hand surgery ?PCCM ?Infectious disease ? ?Procedures:  ?4/26 right foot/leg incision and drainage of deep abscess ?4/29 right forearm fascial biopsy and fasciotomy, right forearm incision and drainage ? ?Antimicrobials:  ?Unasyn ?IV fluconazole ? ? ?Subjective: ?Complains of pain in her feet which feels like neuropathy.  Denies any shortness of  breath. ? ?Objective: ?Vitals:  ?  12/23/21 1800 12/23/21 1900 12/23/21 1901 12/23/21 1930  ?BP: (!) 184/69 (!) 180/73 (!) 180/73 (!) 188/76  ?Pulse: 83   76  ?Resp: (!) 22 (!) 30 (!) 29 (!) 21  ?Temp:    98.7 ?F (37.1 ?C)  ?TempSrc:    Oral  ?SpO2: 97%   98%  ?Weight:      ?Height:      ? ? ?Intake/Output Summary (Last 24 hours) at 12/23/2021 2029 ?Last data filed at 12/23/2021 1800 ?Gross per 24 hour  ?Intake 2051.27 ml  ?Output 350 ml  ?Net 1701.27 ml  ? ?Filed Weights  ? 12/19/21 0354 12/20/21 0435 12/23/21 0630  ?Weight: 105.3 kg 107.1 kg 109 kg  ? ? ?Examination: ? ?General exam: Appears calm and comfortable  ?Respiratory system: Clear to auscultation. Respiratory effort normal. ?Cardiovascular system: S1 & S2 heard, RRR. No JVD, murmurs, rubs, gallops or clicks. No pedal edema. ?Gastrointestinal system: Abdomen is nondistended, soft and nontender. No organomegaly or masses felt. Normal bowel sounds heard. ?Central nervous system: Alert and oriented. No focal neurological deficits. ?Extremities: Symmetric 5 x 5 power. ?Skin: Right lower extremity with dressings ?Psychiatry: Judgement and insight appear normal. Mood & affect appropriate.  ? ? ? ?Data Reviewed: I have personally reviewed following labs and imaging studies ? ?CBC: ?Recent Labs  ?Lab 12/19/21 ?0346 12/20/21 ?0458 12/21/21 ?7026 12/22/21 ?0424 12/23/21 ?0408  ?WBC 8.4 6.3 4.8 5.3 5.6  ?NEUTROABS 7.4 5.6 4.4 4.7 4.9  ?HGB 10.8* 9.7* 9.1* 9.1* 8.3*  ?HCT 35.2* 31.9* 28.8* 28.8* 26.5*  ?MCV 107.6* 106.7* 102.9* 100.7* 101.5*  ?PLT 146* 129* 125* 129* 135*  ? ?Basic Metabolic Panel: ?Recent Labs  ?Lab 12/18/21 ?0323 12/19/21 ?0346 12/20/21 ?0458 12/21/21 ?3785 12/23/21 ?1114  ?NA 142 140 141 142 142  ?K 5.0 4.8 4.1 4.0 3.8  ?CL 104 106 106 107 110  ?CO2 32 29 31 27 26   ?GLUCOSE 124* 123* 117* 184* 129*  ?BUN 42* 34* 29* 27* 37*  ?CREATININE 1.58* 1.60* 1.46* 1.45* 1.42*  ?CALCIUM 10.3 9.6 9.4 9.4 9.5  ?MG 2.2  --   --   --   --   ? ?GFR: ?Estimated  Creatinine Clearance: 46.4 mL/min (A) (by C-G formula based on SCr of 1.42 mg/dL (H)). ?Liver Function Tests: ?Recent Labs  ?Lab 12/17/21 ?8850 12/18/21 ?0323  ?AST 16 18  ?ALT 41 38  ?ALKPHOS 84 73  ?BILITOT 0.4 0.4  ?PROT 6.5 6.0*  ?ALBUMIN 3.4* 2.9*  ? ?No results for input(s): LIPASE, AMYLASE in the last 168 hours. ?No results for input(s): AMMONIA in the last 168 hours. ?Coagulation Profile: ?No results for input(s): INR, PROTIME in the last 168 hours. ?Cardiac Enzymes: ?No results for input(s): CKTOTAL, CKMB, CKMBINDEX, TROPONINI in the last 168 hours. ?BNP (last 3 results) ?No results for input(s): PROBNP in the last 8760 hours. ?HbA1C: ?No results for input(s): HGBA1C in the last 72 hours. ?CBG: ?Recent Labs  ?Lab 12/23/21 ?0329 12/23/21 ?0850 12/23/21 ?1143 12/23/21 ?1547 12/23/21 ?1701  ?GLUCAP 134* 97 105* 251* 208*  ? ?Lipid Profile: ?Recent Labs  ?  12/21/21 ?2774  ?TRIG 180*  ? ?Thyroid Function Tests: ?No results for input(s): TSH, T4TOTAL, FREET4, T3FREE, THYROIDAB in the last 72 hours. ?Anemia Panel: ?No results for input(s): VITAMINB12, FOLATE, FERRITIN, TIBC, IRON, RETICCTPCT in the last 72 hours. ?Sepsis Labs: ?Recent Labs  ?Lab 12/17/21 ?1120 12/17/21 ?2102 12/20/21 ?1022 12/20/21 ?1510  ?LATICACIDVEN 2.2* 1.5 0.6 0.6  ? ? ?Recent Results (from the past  240 hour(s))  ?Blood culture (routine x 2)     Status: Abnormal (Preliminary result)  ? Collection Time: 12/17/21 11:19 AM  ? Specimen: Left Antecubital; Blood  ?Result Value Ref Range Status  ? Specimen Description   Final  ?  LEFT ANTECUBITAL ?Performed at [facility], [place] [address], [place], [place] [address] ?  ? Special Requests   Final  ?  BLOOD Blood Culture adequate volume ?Performed at [facility], [place] [address], [place], [place] [address] ?  ? Culture  Setup Time   Final  ?  YEAST ?AEROBIC BOTTLE ONLY ?CRITICAL RESULT CALLED TO, READ BACK BY AND VERIFIED WITH: [place] ON 12/19/21 @  1723 BY DRT ?  ? Culture (A)  Final  ?  CANDIDA PARAPSILOSIS ?Sent to [place] for further susceptibility testing. ?Performed at [facility], [place] [address] [place],  [address] ?  ? Report Status PENDING  Incomplete  ?Blood

## 2021-12-23 NOTE — Progress Notes (Signed)
Nutrition Follow-up ? ?DOCUMENTATION CODES:  ? ?Obesity unspecified ? ?INTERVENTION:  ?- continue Ensure Plus High Protein BID, Juven BID, and 30 ml Prosource Plus BID. ? ?- monitor for appropriateness of diet education prior to d/c.  ? ?- consider checking serum vitamin A after discussing with patient. ? ? ?NUTRITION DIAGNOSIS:  ? ?Increased nutrient needs related to wound healing as evidenced by estimated needs. -ongoing ? ?GOAL:  ? ?Patient will meet greater than or equal to 90% of their needs -unable to meet ? ?MONITOR:  ? ?PO intake, Supplement acceptance, Labs, Weight trends, I & O's, Skin ? ?ASSESSMENT:  ? ?66 y.o. female with medical history significant of metastatic recurrent non-small cell lung cancer (01/2020) s/p curative radiotherapy (03/2020), with recurrence in February 2023, asthma, anxiety, COPD, GERD, hypertension, hyperlipidemia and HIV was brought in to the emergency department from [facility] due to worsening of the right lower extremity wound. Admitted for Abscess and necrotizing fasciitis of the right lower extremity and mild cellulitis of left lower extremity. ? ?Significant Events: ?4/26- R foot I&D of deep abscess; R leg I&D of deep abscess ?4/27- initial RD assessment ?4/28- double lumen PICC placed in L brachial ?4/29- intubation; R forearm fascila biopsy and fasciotomy; R forearm I&D ?5/1- extubation ? ? ?Patient being attended to by RN and [employer] Able to talk with patient's sister outside of the room. Last documented meal intake was 100% of dinner on 4/27. Diet advanced from NPO to Heart Healthy, Carb Modified today at 1112. At the time of talking with patient's sister, patient had consumed a bottle of Ensure and was looking forward to having a meal but had not yet received one.  ? ?Eight today is +17 lb compared to weight on 4/26. Non-pitting edema to LUE, moderate pitting edema to RUE, and very deep pitting edema to BLE documented in the edema section of flow sheet.  ? ?Orthopedic MD  note from today outlines recommendation for R AKA; family discussing. ? ?Re-estimated nutrition needs d/t previous OR visits and pending potential AKA d/t ongoing R leg wound concerns. ? ? ?Labs reviewed; CBGs: 135, 134, 97, 105 mg/dl, BUN: 37 mg/dl, creatinine: 1.42 mg/dl, GFR: 41 ml/min. Vitamin D: 43.9 (ref range: 30-100). ? ?Medications reviewed; 500 mg ascorbic acid BID, 40 mg pepcid/day, sliding scale novolog, 40 mg solu-medrol/day, 15 ml multivitamin/day, 17 g miralax/day, 25 mg aldactone/day, 220 mg zinc sulfate/day.  ? ? ?Diet Order:   ?Diet Order   ? ?       ?  Diet heart healthy/carb modified Room service appropriate? Yes; Fluid consistency: Thin  Diet effective now       ?  ? ?  ?  ? ?  ? ? ?EDUCATION NEEDS:  ? ?Not appropriate for education at this time ? ?Skin:  Skin Assessment: Skin Integrity Issues: ?Skin Integrity Issues:: Other (Comment), Stage I, Incisions ?Stage I: R buttocks ?Incisions: R leg (4/26); R pre-tibial area (4/27) ?Other: MASD to bilateral buttocks ? ?Last BM:  5/1 (type 6 x1, large amount) ? ?Height:  ? ?Ht Readings from Last 1 Encounters:  ?12/20/21 5\' 4"  (1.626 m)  ? ? ?Weight:  ? ?Wt Readings from Last 1 Encounters:  ?12/23/21 109 kg  ? ? ? ?BMI:  Body mass index is 41.25 kg/m?. ? ?Estimated Nutritional Needs:  ?Kcal:  2200-2400 kcal ?Protein:  115-130 grams ?Fluid:  2L/day ? ? ? ? ?Jarome Matin, MS, RD, LDN ?Registered Dietitian II ?Inpatient Clinical Nutrition ?RD pager # and on-call/weekend  pager # available in [place]  ? ?

## 2021-12-23 NOTE — Progress Notes (Signed)
Patient ID: Shannon [name], female   DOB: 10-14-1955, 66 y.o.   MRN: [id number] ?Patient is seen in follow-up for her right lower extremity with gangrenous ulcers and necrotizing fasciitis.  Patient has had progressive ulceration and progressive gangrenous changes to the right lower extremity.  She has pain to palpation of the proximal calf.  Patient's thigh is soft nontender to palpation no evidence of infection in the thigh.  I discussed with the patient and her sister at bedside recommendation to proceed with an above-the-knee amputation.  Patient does not have sufficient soft tissue below the knee to proceed with a below the knee amputation. ? ?Patient's sister states that she would like to discuss this with family and would like to proceed with surgery on Friday.  She has my phone number and will call with any questions. ? ?Plan for right above-knee amputation at [facility] on Friday. ?

## 2021-12-23 NOTE — Progress Notes (Signed)
Occupational Therapy Evaluation ? ?Patient lives alone in a townhouse and is typically independent with self care and mobility. Currently patient is limited by painful R lower and upper extremity. Bilateral lower extremities are sensitive to touch L ankle/lower leg has blistering and R lower leg/foot s/p I+D. Patient needs significant time and mod A to support R LE to sit up at edge of bed. Deferred attempt to stand as patient states it would be too painful to weight bear. Patient was able to raise R upper extremity ~90 degrees shoulder flexion which sister states is an improvement. Encourage patient to perform R fist pumps as hand is edematous. Recommend continued acute OT services in order to facilitate D/C to venue listed below. ? ? ? 12/23/21 1200  ?OT Visit Information  ?Last OT Received On 12/23/21  ?Assistance Needed +2  ?PT/OT/SLP Co-Evaluation/Treatment Yes  ?Reason for Co-Treatment Complexity of the patient's impairments (multi-system involvement);To address functional/ADL transfers;For patient/therapist safety  ?PT goals addressed during session Mobility/safety with mobility  ?OT goals addressed during session ADL's and self-care  ?History of Present Illness Shannon [name] is a 66 year old woman with metastatic non-small cell lung cancer, asthma/COPD, GERD, hypertension and HIV who was admitted 12/17/21  due to right lower extremity wound  S/P irrigation and debridement of right foot and calf wounds on 12/17/21 .  On 4/28 she developed swelling of her right upper extremity and went for incision and drainage morning of 4/29 without overt signs of necrotizing fasciitis. She developed progressive mental status changes 4/28 to 4/29 with obtundation. She was transferred to the ICU and PCCM consulted for hypercapnic respiratory failure.    Intubated 4/29 and extubated 12/22/21  ?Precautions  ?Precautions Fall  ?Precaution Comments RLE painful to touch, monitor  Sats  ?Required Braces or Orthoses Splint/Cast   ?Splint/Cast Has a darco shoe in package in room. Orderd by ortho post I and D of right leg.  ?Restrictions  ?Weight Bearing Restrictions No  ?Home Living  ?Family/patient expects to be discharged to: Private residence  ?Living Arrangements Alone  ?Available Help at Discharge Family;Available PRN/intermittently  ?Type of Home House  ?Home Access Stairs to enter  ?Entrance Stairs-Number of Steps back door none, front door 8  ?Home Layout Two level;Bed/bath upstairs  ?Alternate Level Stairs-Number of Steps flight with landing  ?Bathroom Shower/Tub Tub/shower unit  ?Bathroom Toilet Standard  ?Home Equipment Rolling Walker (2 wheels)  ?Prior Function  ?Prior Level of Function  Independent/Modified Independent  ?Communication  ?Communication No difficulties  ?Pain Assessment  ?Pain Assessment 0-10  ?Pain Location bilateral legs 7/10  ?Pain Descriptors / Indicators Discomfort;Grimacing;Guarding;Moaning  ?Pain Intervention(s) Limited activity within patient's tolerance;Monitored during session  ?Cognition  ?Arousal/Alertness Awake/alert  ?Behavior During Therapy Anxious  ?Overall Cognitive Status Impaired/Different from baseline  ?Area of Impairment Orientation  ?Orientation Level Disoriented to;Time  ?General Comments anxious about moving the right leg,  follows directions with extra time.  ?Upper Extremity Assessment  ?Upper Extremity Assessment RUE deficits/detail;LUE deficits/detail  ?RUE Deficits / Details Edematous R hand with dressing around R forearm 2* fasciotomy. Able to raise R shoulder ~90 degrees without pain which patient's sister reports is improvement from yesterday  ?LUE Deficits / Details Grossly intact  ?Lower Extremity Assessment  ?Lower Extremity Assessment Defer to PT evaluation  ?ADL  ?Overall ADL's  Needs assistance/impaired  ?Eating/Feeding Independent;Sitting  ?Eating/Feeding Details (indicate cue type and reason) Drink water from cup  ?Grooming Set up;Bed level  ?Upper Body Bathing Moderate  assistance;Bed level;Sitting  ?  Lower Body Bathing Total assistance;Sitting/lateral leans;Bed level  ?Upper Body Dressing  Moderate assistance;Sitting;Bed level  ?Lower Body Dressing Total assistance;Sitting/lateral leans;Bed level  ?Toilet Transfer Details (indicate cue type and reason) Deferred, patient having too much pain in R leg to attempt weight bearing/standing  ?Toileting- Clothing Manipulation and Hygiene Total assistance;Bed level  ?General ADL Comments Patient needing increased assistance for self care tasks due to pain, decreased activity tolerance, safety awareness, cognition  ?Bed Mobility  ?Overal bed mobility Needs Assistance  ?Bed Mobility Supine to Sit;Sit to Supine  ?Supine to sit Mod assist;HOB elevated  ?Sit to supine Mod assist;HOB elevated  ?General bed mobility comments extra time and support for the right leg, patient moved left leg,  patient gradually able to scoot  self around to sit  on bed edge with mod assist for right leg support throughout. Slow to return to supine with patient lifting both legs to return  onto bed.  ?Balance  ?Overall balance assessment Needs assistance  ?Sitting-balance support Feet unsupported;Single extremity supported  ?Sitting balance-Leahy Scale Poor  ?Sitting balance - Comments Right leg supported by therapist. Patient somewhat propped against the [facility] raised  ?General Comments  ?General comments (skin integrity, edema, etc.) O2 90% sitting edge of bed on 4L O2  ?OT - End of Session  ?Equipment Utilized During Treatment Oxygen  ?Activity Tolerance Patient limited by pain  ?Patient left in bed;with call bell/phone within reach;with bed alarm set;with family/visitor present  ?Nurse Communication Mobility status  ?OT Assessment  ?OT Recommendation/Assessment Patient needs continued OT Services  ?OT Visit Diagnosis Other abnormalities of gait and mobility (R26.89)  ?OT Problem List Pain;Obesity;Decreased activity tolerance;Decreased strength;Decreased range of  motion;Impaired balance (sitting and/or standing);Decreased cognition;Decreased safety awareness;Cardiopulmonary status limiting activity  ?OT Plan  ?OT Frequency (ACUTE ONLY) Min 2X/week  ?OT Treatment/Interventions (ACUTE ONLY) Self-care/ADL training;Therapeutic exercise;DME and/or AE instruction;Therapeutic activities;Cognitive remediation/compensation;Balance training;Patient/family education  ?AM-PAC OT "6 Clicks" Daily Activity Outcome Measure (Version 2)  ?Help from another person eating meals? 3  ?Help from another person taking care of personal grooming? 3  ?Help from another person toileting, which includes using toliet, bedpan, or urinal? 1  ?Help from another person bathing (including washing, rinsing, drying)? 2  ?Help from another person to put on and taking off regular upper body clothing? 2  ?Help from another person to put on and taking off regular lower body clothing? 1  ?6 Click Score 12  ?Progressive Mobility  ?What is the highest level of mobility based on the progressive mobility assessment? Level 2 ([place]) - Balance while sitting on edge of bed and cannot stand  ?Activity Dangled on edge of bed  ?OT Recommendation  ?Follow Up Recommendations Skilled nursing-short term rehab (<3 hours/day)  ?Assistance recommended at discharge Frequent or constant Supervision/Assistance  ?Patient can return home with the following A lot of help with walking and/or transfers;A lot of help with bathing/dressing/bathroom;Assistance with cooking/housework;Direct supervision/assist for medications management;Direct supervision/assist for financial management;Assist for transportation;Help with stairs or ramp for entrance  ?Functional Status Assessent Patient has had a recent decline in their functional status and demonstrates the ability to make significant improvements in function in a reasonable and predictable amount of time.  ?OT Equipment BSC/3in1  ?Individuals Consulted  ?Consulted and Agree with Results  and Recommendations Patient  ?Acute Rehab OT Goals  ?Patient Stated Goal Less pain  ?OT Goal Formulation With patient  ?Time For Goal Achievement 01/06/22  ?Potential to Achieve Goals Good  ?OT Time Calculation  ?OT St

## 2021-12-23 NOTE — TOC Progression Note (Signed)
Transition of Care (TOC) - Progression Note  ? ?Patient Details  ?Name: Shannon [name] ?MRN: [id number] ?Date of Birth: 05-Oct-1955 ? ?Transition of Care (TOC) CM/SW Contact  ?Sherie Don, LCSW ?Phone Number: ?12/23/2021, 3:07 PM ? ?Clinical Narrative: PT and OT evaluations recommended SNF. CSW attempted to call patient as she is oriented x4 per chart to discuss recommendations. Sister answered call and insisted the patient should not be called at all regardless of orientation. CSW made several attempts to discuss therapies' recommendations and sister became angry stating, "she hasn't even had surgery yet." CSW explained that if therapy is recommending short-term rehab now, it will likely be recommended after surgery. Sister repeated stated the patient needs "long-term rehab" and not short-term rehab. CSW attempted to explain that Medicare covers a certain number of days and then patient would be in copay days if the patient was approved to stay longer in rehab. Sister continued to escalate with her anger stating, "you're not helpful" and "this is premature" even though it was also explained that discharge planning starts prior to the day of discharge. FL2 started, but will need to be completed after therapy works with patient after surgery. PASRR received. TOC to follow. ? ?Expected Discharge Plan: [place] ?Barriers to Discharge: No Barriers Identified ? ?Expected Discharge Plan and Services ?Expected Discharge Plan: [place] ?Discharge Planning Services: CM Consult ?Post Acute Care Choice: [place] ?Living arrangements for the past 2 months: Apartment ? ?Readmission Risk Interventions ? ?  12/22/2021  ?  9:44 AM  ?Readmission Risk Prevention Plan  ?Transportation Screening Complete  ?Medication Review Press photographer) Complete  ?[place] or Home Care Consult Complete  ?SW Recovery Care/Counseling Consult Complete  ?Palliative Care Screening Not Applicable  ?[place] Not Applicable  ? ?

## 2021-12-24 ENCOUNTER — Inpatient Hospital Stay: Payer: Medicare Other | Admitting: Nurse Practitioner

## 2021-12-24 DIAGNOSIS — D539 Nutritional anemia, unspecified: Secondary | ICD-10-CM

## 2021-12-24 DIAGNOSIS — L02611 Cutaneous abscess of right foot: Secondary | ICD-10-CM | POA: Diagnosis not present

## 2021-12-24 LAB — GLUCOSE, CAPILLARY
Glucose-Capillary: 122 mg/dL — ABNORMAL HIGH (ref 70–99)
Glucose-Capillary: 183 mg/dL — ABNORMAL HIGH (ref 70–99)
Glucose-Capillary: 231 mg/dL — ABNORMAL HIGH (ref 70–99)
Glucose-Capillary: 178 mg/dL — ABNORMAL HIGH (ref 70–99)

## 2021-12-24 LAB — BASIC METABOLIC PANEL
Anion gap: 2 — ABNORMAL LOW (ref 5–15)
BUN: 45 mg/dL — ABNORMAL HIGH (ref 8–23)
CO2: 28 mmol/L (ref 22–32)
Calcium: 9.4 mg/dL (ref 8.9–10.3)
Chloride: 111 mmol/L (ref 98–111)
Creatinine, Ser: 1.31 mg/dL — ABNORMAL HIGH (ref 0.44–1.00)
GFR, Estimated: 45 mL/min — ABNORMAL LOW (ref >60)
Glucose, Bld: 152 mg/dL — ABNORMAL HIGH (ref 70–99)
Potassium: 4.1 mmol/L (ref 3.5–5.1)
Sodium: 141 mmol/L (ref 135–145)

## 2021-12-24 LAB — CBC WITH DIFFERENTIAL/PLATELET
Abs Immature Granulocytes: 0.07 10*3/uL (ref 0.00–0.07)
Basophils Absolute: 0 10*3/uL (ref 0.0–0.1)
Basophils Relative: 0 %
Eosinophils Absolute: 0 10*3/uL (ref 0.0–0.5)
Eosinophils Relative: 0 %
HCT: 27.4 % — ABNORMAL LOW (ref 36.0–46.0)
Hemoglobin: 8.7 g/dL — ABNORMAL LOW (ref 12.0–15.0)
Immature Granulocytes: 2 %
Lymphocytes Relative: 6 %
Lymphs Abs: 0.3 10*3/uL — ABNORMAL LOW (ref 0.7–4.0)
MCH: 32.5 pg (ref 26.0–34.0)
MCHC: 31.8 g/dL (ref 30.0–36.0)
MCV: 102.2 fL — ABNORMAL HIGH (ref 80.0–100.0)
Monocytes Absolute: 0.2 10*3/uL (ref 0.1–1.0)
Monocytes Relative: 5 %
Neutro Abs: 4.1 10*3/uL (ref 1.7–7.7)
Neutrophils Relative %: 87 %
Platelets: 120 10*3/uL — ABNORMAL LOW (ref 150–400)
RBC: 2.68 MIL/uL — ABNORMAL LOW (ref 3.87–5.11)
RDW: 14 % (ref 11.5–15.5)
WBC: 4.7 10*3/uL (ref 4.0–10.5)
nRBC: 0.6 % — ABNORMAL HIGH (ref 0.0–0.2)

## 2021-12-24 LAB — TRIGLYCERIDES: Triglycerides: 264 mg/dL — ABNORMAL HIGH (ref <150)

## 2021-12-24 LAB — VITAMIN B12: Vitamin B-12: 1205 pg/mL — ABNORMAL HIGH (ref 180–914)

## 2021-12-24 LAB — FOLATE: Folate: 11.6 ng/mL (ref >5.9)

## 2021-12-24 MED ORDER — METOPROLOL TARTRATE 5 MG/5ML IV SOLN
5.0000 mg | Freq: Four times a day (QID) | INTRAVENOUS | Status: DC | PRN
  Administered 2021-12-25 – 2021-12-27 (×5): 5 mg via INTRAVENOUS
  Filled 2021-12-24 (×5): qty 5

## 2021-12-24 MED ORDER — METOPROLOL TARTRATE 25 MG PO TABS
50.0000 mg | ORAL_TABLET | Freq: Two times a day (BID) | ORAL | Status: DC
  Administered 2021-12-24 – 2021-12-25 (×3): 50 mg via ORAL
  Filled 2021-12-24 (×3): qty 2

## 2021-12-24 MED ORDER — PREDNISONE 20 MG PO TABS
20.0000 mg | ORAL_TABLET | Freq: Every day | ORAL | Status: AC
  Administered 2021-12-28 – 2021-12-30 (×3): 20 mg via ORAL
  Filled 2021-12-24 (×3): qty 1

## 2021-12-24 MED ORDER — METOPROLOL TARTRATE 25 MG PO TABS
25.0000 mg | ORAL_TABLET | ORAL | Status: AC
  Administered 2021-12-24: 25 mg via ORAL
  Filled 2021-12-24: qty 1

## 2021-12-24 MED ORDER — FAMOTIDINE 20 MG PO TABS
20.0000 mg | ORAL_TABLET | Freq: Every day | ORAL | Status: DC
  Administered 2021-12-24 – 2022-01-14 (×21): 20 mg via ORAL
  Filled 2021-12-24 (×21): qty 1

## 2021-12-24 MED ORDER — PREDNISONE 20 MG PO TABS
30.0000 mg | ORAL_TABLET | Freq: Every day | ORAL | Status: AC
  Administered 2021-12-25 – 2021-12-27 (×2): 30 mg via ORAL
  Filled 2021-12-24 (×2): qty 1

## 2021-12-24 MED ORDER — PREDNISONE 5 MG PO TABS
10.0000 mg | ORAL_TABLET | Freq: Every day | ORAL | Status: DC
  Administered 2021-12-31: 10 mg via ORAL
  Filled 2021-12-24: qty 2

## 2021-12-24 NOTE — Progress Notes (Signed)
Patient her SBP's have been greater than 165 most of night with scheduled and prn blood pressure medications.   Discussed with on-call MD may need to add second blood pressure prn d/t infection in leg and current comorbidities. ?

## 2021-12-24 NOTE — Progress Notes (Addendum)
?  [facility] for Infectious Disease   ? ?Date of Admission:  12/17/2021   Total days of antibiotics 8/unasyn & fluconazole ?        ? ? ?ID: Shannon [name] is a 66 y.o. female with HIV disease-well controlled, recurrent lung ca, who admitted for sepsis from necrotizing fasciitis.  ?Principal Problem: ?  Abscess of right foot ?Active Problems: ?  HIV disease ([place]) ?  Uncontrolled hypertension ?  Morbid (severe) obesity due to excess calories ([place]) ?  Atrial flutter ([place]) ?  Chronic respiratory failure with hypoxia (HCC) ?  Chronic anticoagulation ?  (HFpEF) heart failure with preserved ejection fraction ([place]) ?  Adenocarcinoma of lung ([place]) ?  Abscess of foot ?  Necrotizing fasciitis ([place]) ?  Pressure injury of skin ?  Fungemia ?  Acute respiratory failure with hypercapnia (HCC) ?  Macrocytic anemia ? ? ? ?Subjective: ?Afebrile. More alert, has little recollection of initial days of admission. She understands that there is recommendation for amputation of right leg in order to treat infection in addition to iv abtx. She has not made decision yet, but asked for me to discuss again with her this afternoon. ? ?Medications:  ? (feeding supplement) PROSource Plus  30 mL Oral BID BM  ? arformoterol  15 mcg Nebulization BID  ? vitamin C  500 mg Oral BID  ? bictegravir-emtricitabine-tenofovir AF  1 tablet Oral Daily  ? budesonide (PULMICORT) nebulizer solution  0.5 mg Nebulization BID  ? chlorhexidine  15 mL Mouth Rinse BID  ? Chlorhexidine Gluconate Cloth  6 each Topical Daily  ? diltiazem  60 mg Oral Q6H  ? enoxaparin (LOVENOX) injection  100 mg Subcutaneous Q12H  ? famotidine  20 mg Oral Daily  ? feeding supplement  237 mL Oral BID BM  ? fentaNYL (SUBLIMAZE) injection  50 mcg Intravenous Once  ? insulin aspart  0-15 Units Subcutaneous TID WC  ? insulin aspart  0-5 Units Subcutaneous QHS  ? mouth rinse  15 mL Mouth Rinse q12n4p  ? metoprolol tartrate  25 mg Oral BID  ? multivitamin  15 mL Oral Daily  ? polyethylene  glycol  17 g Oral Daily  ? [START ON 12/25/2021] predniSONE  30 mg Oral Q breakfast  ? Followed by  ? [START ON 12/28/2021] predniSONE  20 mg Oral Q breakfast  ? Followed by  ? [START ON 12/31/2021] predniSONE  10 mg Oral Q breakfast  ? QUEtiapine  25 mg Oral QHS  ? revefenacin  175 mcg Nebulization Daily  ? sodium chloride flush  10-40 mL Intracatheter Q12H  ? spironolactone  25 mg Oral Daily  ? zinc sulfate  220 mg Oral Daily  ? ? ?Objective: ?Vital signs in last 24 hours: ?Temp:  [97.2 ?F (36.2 ?C)-98.7 ?F (37.1 ?C)] 98.3 ?F (36.8 ?C) (05/03 1153) ?Pulse Rate:  [62-98] 98 (05/03 1100) ?Resp:  [15-43] 27 (05/03 1100) ?BP: (151-190)/(48-93) 153/64 (05/03 1100) ?SpO2:  [89 %-100 %] 94 % (05/03 1100) ?FiO2 (%):  [35 %-40 %] 35 % (05/03 0408) ?Weight:  [195 kg] 108 kg (05/03 0500) ?Physical Exam  ?Constitutional:  oriented to person, place, and time. appears well-developed and well-nourished. No distress.  ?HENT: [place]/AT, PERRLA, no scleral icterus ?Mouth/Throat: Oropharynx is clear and moist. No oropharyngeal exudate.  ?Cardiovascular: Normal rate, regular rhythm and normal heart sounds. Exam reveals no gallop and no friction rub.  ?No murmur heard.  ?Pulmonary/Chest: Effort normal and breath sounds normal. No respiratory distress.  has no wheezes.  ?Neck = supple, no nuchal rigidity ?Abdominal: Soft. Bowel sounds are normal.  exhibits no distension. There is no tenderness.  ?Ext: right upper extremity wrapped, still has some serous superficial blistering medial aspect of upper arm. Right lower leg is wrapped still showing changes in unhealthy tissue. ?Neurological: alert and oriented to person, place, and time.  ?Skin: Skin is warm and dry. No rash noted. No erythema.  ?Psychiatric: a normal mood and affect.  behavior is normal.  ? ? ?Lab Results ?Recent Labs  ?  12/23/21 ?0408 12/23/21 ?1114 12/24/21 ?0240  ?WBC 5.6  --  4.7  ?HGB 8.3*  --  8.7*  ?HCT 26.5*  --  27.4*  ?NA  --  142 141  ?K  --  3.8 4.1  ?CL  --  110 111   ?CO2  --  26 28  ?BUN  --  37* 45*  ?CREATININE  --  1.42* 1.31*  ? ?Liver Panel ?No results for input(s): PROT, ALBUMIN, AST, ALT, ALKPHOS, BILITOT, BILIDIR, IBILI in the last 72 hours. ?Sedimentation Rate ?No results for input(s): ESRSEDRATE in the last 72 hours. ?C-Reactive Protein ?No results for input(s): CRP in the last 72 hours. ? ?Microbiology: ?Polymicrobial leg tissue cx ?Klebsiella pneumoniae Proteus species Enterococcus faecalis     ? MIC MIC MIC   ? AMPICILLIN >=32 RESIST... Resistant >=32 RESIST... Resistant <=2 SENSITIVE  Sensitive   ? AMPICILLIN/SULBACTAM 8 SENSITIVE  Sensitive 8 SENSITIVE  Sensitive     ? CEFAZOLIN <=4 SENSITIVE  Sensitive >=64 RESIST... Resistant     ? CEFEPIME <=0.12 SENS... Sensitive <=0.12 SENS... Sensitive     ? CEFTAZIDIME <=1 SENSITIVE  Sensitive <=1 SENSITIVE  Sensitive     ? CEFTRIAXONE <=0.25 SENS... Sensitive <=0.25 SENS... Sensitive     ? CIPROFLOXACIN <=0.25 SENS... Sensitive <=0.25 SENS... Sensitive     ? GENTAMICIN <=1 SENSITIVE  Sensitive <=1 SENSITIVE  Sensitive     ? GENTAMICIN SYNERGY     SENSITIVE  Sensitive   ? IMIPENEM <=0.25 SENS... Sensitive 2 SENSITIVE  Sensitive     ? PIP/TAZO <=4 SENSITIVE  Sensitive <=4 SENSITIVE  Sensitive     ? TRIMETH/SULFA <=20 SENSIT... Sensitive <=20 SENSIT... Sensitive     ? VANCOMYCIN     2 SENSITIVE  Sensitive   ? ?Blood cx : c.parapsilosis ?Studies/Results: ?No results found. ? ? ?Assessment/Plan: ?Necrotizing fasciitis of right lower leg s/p I x D = polymicrobial currently on amp/sub but still needs further debridement/amputation. Will discuss with patient pros/cons of amputation. AKA would allow for shorter course of healing, less abtx overall. ? ?Patient has agreed to amputation since she realize that would be lead to faster recovery vs. Repeat I x D for necrotizing fasciitis. Since her sister is coming back into town from [place] on Friday, she is wondering if the surgery can be Saturday or Monday. ? ?C.parapsilosis fungemia=  continue on fluconazole. Likely from necrotizing fasciitis. Will need TEE to rule out endocarditis and eye exam from endophthalmitis ? ?HIV disease= will continue on biktarvy. At discharge, will add bactrim DS daily for oi proph ? ?Recurrent lung ca= needs to follow up after discharge with dr Inda Merlin for plan for palliative chemo/rads ? ?Shannon [name] ?[facility] for Infectious Diseases ?Pager: [phone] ? ?12/24/2021, 12:53 PM ? ? ? ? ? ?

## 2021-12-24 NOTE — Progress Notes (Signed)
Patient refused BiPAP.  Pt wishes to sleep on her nasal cannula instead.  Patient alert and oriented and firm on her refusal despite education regarding BiPAP need. ?

## 2021-12-24 NOTE — Hospital Course (Addendum)
66 year old female with a history of metastatic adenocarcinoma of lung, COPD, HIV, was admitted to the hospital with right lower extremity wound, necrotizing fascitis,seen by orthopedics and underwent incision and drainage.  He was noted to have fungemia.  Infectious disease following for antibiotics/antifungals. Overall, her condition worsened with worsening mental status and respiratory status.She required intubation and transfer to ICU.  She was treated for COPD exacerbation and since then, respiratory status has stabilized and she was extubated on 5/1.  Orthopedics following for gas gangrene/necrotizing fasciitis of her right lower extremity and plans are for amputation, underwent Rt AKA at [facility] 12/26/21. Post op with anemia needing prbc transfusions x2. PTOT working and advised CIR ? ?

## 2021-12-24 NOTE — TOC Progression Note (Signed)
Transition of Care (TOC) - Progression Note  ? ?Patient Details  ?Name: Shannon [name] ?MRN: [id number] ?Date of Birth: 04/20/1956 ? ?Transition of Care (TOC) CM/SW Contact  ?Sherie Don, LCSW ?Phone Number: ?12/24/2021, 1:15 PM ? ?Clinical Narrative: FL2 done. Initial referral faxed out. TOC awaiting bed offers. ? ?Expected Discharge Plan: [place] ?Barriers to Discharge: No Barriers Identified ? ?Expected Discharge Plan and Services ?Expected Discharge Plan: [place] ?Discharge Planning Services: CM Consult ?Post Acute Care Choice: [place] ?Living arrangements for the past 2 months: Apartment ? ?Readmission Risk Interventions ? ?  12/22/2021  ?  9:44 AM  ?Readmission Risk Prevention Plan  ?Transportation Screening Complete  ?Medication Review Press photographer) Complete  ?[place] or Home Care Consult Complete  ?SW Recovery Care/Counseling Consult Complete  ?Palliative Care Screening Not Applicable  ?[place] Not Applicable  ? ?

## 2021-12-24 NOTE — [place] FL2 (Signed)
?[place] MEDICAID FL2 LEVEL OF CARE SCREENING TOOL  ?  ? ?IDENTIFICATION  ?Patient Name: ?Shannon [name] Birthdate: 01-21-1956 Sex: female Admission Date (Current Location): ?12/17/2021  ?[place] and [place] Number: ? [place] ?[id number] [place] and Address:  ?[facility],  [address] [place], [place] ?     Provider Number: ?[id number]  ?Attending Physician Name and Address:  ?Antonieta Pert, MD ? Relative Name and Phone Number:  ?Francis Dowse (sister) Ph: [phone] ?   ?Current Level of Care: ?Hospital Recommended Level of Care: ?[place] Prior Approval Number: ?  ? ?Date Approved/Denied: ?  PASRR Number: ?[id number] A ? ?Discharge Plan: ?SNF ?  ? ?Current Diagnoses: ?Patient Active Problem List  ? Diagnosis Date Noted  ? Macrocytic anemia 12/24/2021  ? Pressure injury of skin 12/22/2021  ? Fungemia   ? Acute respiratory failure with hypercapnia (HCC)   ? Gas gangrene of foot ([place])   ? Abscess of foot 12/17/2021  ? Abscess of right foot 12/17/2021  ? Necrotizing fasciitis ([place]) 12/17/2021  ? Malignant neoplasm metastatic to bone ([place]) 10/06/2021  ? Chest wall pain 08/19/2021  ? Acute right-sided thoracic back pain 08/12/2021  ? Intercostal muscle strain 04/14/2021  ? Rib fracture 03/12/2021  ? Osteoporosis 03/12/2021  ? Panniculitis 01/23/2021  ? Healthcare maintenance 01/23/2021  ? On supplemental oxygen therapy 11/19/2020  ? Insomnia 11/14/2020  ? Burn erythema of breast, initial encounter 11/05/2020  ? Skin ulcer ([place]) 06/24/2020  ? Pulmonary nodule 02/14/2020  ? Nodule of upper lobe of left lung 02/06/2020  ? Thyroid nodule 01/26/2020  ? Adenocarcinoma of lung ([place]) 01/04/2020  ? Chronic obstructive asthma ([place]) 12/29/2019  ? (HFpEF) heart failure with preserved ejection fraction ([place]) 02/07/2019  ? Acute on chronic heart failure with preserved ejection fraction (HFpEF) ([place]) 02/04/2019  ? Vitamin D deficiency 06/30/2018  ? Chronic anticoagulation 01/03/2018  ? Chronic  respiratory failure with hypoxia ([place]) 11/30/2017  ? Atrial flutter ([place]) 03/11/2017  ? CAD (coronary artery disease) 01/28/2017  ? Obesity (BMI 30-39.9) 01/13/2017  ? Ascending aorta dilatation (HCC) 01/07/2017  ? Low back pain radiating to right lower extremity 12/02/2016  ? Acquired cyst of kidney 12/02/2016  ? Chronic kidney disease (CKD), stage III (moderate) ([place]) 12/01/2016  ? Generalized anxiety disorder 10/22/2016  ? Hypersomnia 10/01/2016  ? Accessory skin tags 06/10/2016  ? Nocturnal hypoxemia 05/13/2016  ? Cervical radiculopathy 04/14/2016  ? GERD (gastroesophageal reflux disease) 01/02/2016  ? Morbid (severe) obesity due to excess calories ([place]) 10/10/2015  ? Vitamin B12 deficiency 10/02/2015  ? HIV disease ([place]) 07/09/2015  ? Uncontrolled hypertension 07/09/2015  ? ? ?Orientation RESPIRATION BLADDER Height & Weight   ?  ?Self, Time, Situation, Place ? O2 (4L/min) Incontinent Weight: 238 lb 1.6 oz (108 kg) ?Height:  5\' 4"  (162.6 cm)  ?BEHAVIORAL SYMPTOMS/MOOD NEUROLOGICAL BOWEL NUTRITION STATUS  ?   (N/A) Continent Diet (Heart healthy/carb modified)  ?AMBULATORY STATUS COMMUNICATION OF NEEDS Skin   ?Extensive Assist Verbally Surgical wounds, Other (Comment) (Surgical wound: pt will have right AKA; Blister: right arm, upper leg; Ecchymosis: abdomen, back, arm, leg, sacrum, perineum; Erythema: bilateral legs; Weeping: right arm, left leg) ?  ?  ?  ?    ?     ?     ? ? ?Personal Care Assistance Level of Assistance  ?Bathing, Feeding, Dressing Bathing Assistance: Maximum assistance ?Feeding assistance: Limited assistance ?Dressing Assistance: Maximum assistance ?   ? ?Functional Limitations Info  ?Sight, Hearing, Speech Sight Info: Adequate ?  Hearing Info: Adequate ?Speech Info: Adequate  ? ? ?SPECIAL CARE FACTORS FREQUENCY  ?PT (By licensed PT), OT (By licensed OT)   ?  ?PT Frequency: 5x's/week ?OT Frequency: 5x's/week ?  ?  ?  ?   ? ? ?Contractures Contractures Info: Not present  ? ? ?Additional Factors  Info  ?Code Status, Allergies, Psychotropic, Insulin Sliding Scale Code Status Info: Full ?Allergies Info: Lisinopril, Tree Extract, Augmentin (Amoxicillin-pot Clavulanate), Ciprofloxacin ?Psychotropic Info: Seroquel ?Insulin Sliding Scale Info: See discharge summary ?  ?   ? ?Current Medications (12/24/2021):  This is the current hospital active medication list ?Current Facility-Administered Medications  ?Medication Dose Route Frequency Provider Last Rate Last Admin  ? (feeding supplement) PROSource Plus liquid 30 mL  30 mL Oral BID BM Pahwani, Ravi, MD   30 mL at 12/23/21 1456  ? 0.9 %  sodium chloride infusion   Intravenous PRN Kathie Dike, MD 10 mL/hr at 12/24/21 0700 Infusion Verify at 12/24/21 0700  ? acetaminophen (TYLENOL) tablet 650 mg  650 mg Oral Q6H PRN Darliss Cheney, MD   650 mg at 12/23/21 1022  ? Or  ? acetaminophen (TYLENOL) suppository 650 mg  650 mg Rectal Q6H PRN Darliss Cheney, MD      ? albuterol (PROVENTIL) (2.5 MG/3ML) 0.083% nebulizer solution 2.5 mg  2.5 mg Nebulization Q6H PRN Darliss Cheney, MD   2.5 mg at 12/20/21 1509  ? Ampicillin-Sulbactam (UNASYN) 3 g in sodium chloride 0.9 % 100 mL IVPB  3 g Intravenous Q6H [name], MD   Stopped at 12/24/21 1017  ? arformoterol (BROVANA) nebulizer solution 15 mcg  15 mcg Nebulization BID Freddi Starr, MD   15 mcg at 12/24/21 0831  ? ascorbic acid (VITAMIN C) tablet 500 mg  500 mg Oral BID Kathie Dike, MD   500 mg at 12/24/21 0940  ? bictegravir-emtricitabine-tenofovir AF (BIKTARVY) 50-200-25 MG per tablet 1 tablet  1 tablet Oral Daily [name], MD   1 tablet at 12/24/21 0941  ? budesonide (PULMICORT) nebulizer solution 0.5 mg  0.5 mg Nebulization BID Freda Jackson B, MD   0.5 mg at 12/24/21 0830  ? chlorhexidine (PERIDEX) 0.12 % solution 15 mL  15 mL Mouth Rinse BID Freddi Starr, MD   15 mL at 12/24/21 0943  ? Chlorhexidine Gluconate Cloth 2 % PADS 6 each  6 each Topical Daily Darliss Cheney, MD   6 each at 12/24/21  0600  ? diltiazem (CARDIZEM) tablet 60 mg  60 mg Oral Q6H Corey Harold, NP   60 mg at 12/24/21 1209  ? enoxaparin (LOVENOX) injection 100 mg  100 mg Subcutaneous Q12H Darliss Cheney, MD   100 mg at 12/24/21 0601  ? famotidine (PEPCID) tablet 20 mg  20 mg Oral Daily Kc, Ramesh, MD   20 mg at 12/24/21 0940  ? feeding supplement (ENSURE ENLIVE / ENSURE PLUS) liquid 237 mL  237 mL Oral BID BM Darliss Cheney, MD   237 mL at 12/24/21 0945  ? fentaNYL (SUBLIMAZE) injection 50 mcg  50 mcg Intravenous Once Corey Harold, NP      ? fluconazole (DIFLUCAN) IVPB 400 mg  400 mg Intravenous Q24H Darliss Cheney, MD   Stopped at 12/23/21 1659  ? hydrALAZINE (APRESOLINE) injection 10 mg  10 mg Intravenous Q6H PRN Freddi Starr, MD   10 mg at 12/24/21 0142  ? insulin aspart (novoLOG) injection 0-15 Units  0-15 Units Subcutaneous TID WC Kathie Dike, MD   3 Units at  12/24/21 1210  ? insulin aspart (novoLOG) injection 0-5 Units  0-5 Units Subcutaneous QHS Kathie Dike, MD      ? lip balm (CARMEX) ointment   Topical PRN Darliss Cheney, MD      ? MEDLINE mouth rinse  15 mL Mouth Rinse q12n4p Freda Jackson B, MD   15 mL at 12/24/21 1211  ? metoprolol tartrate (LOPRESSOR) tablet 25 mg  25 mg Oral BID Kathie Dike, MD   25 mg at 12/24/21 0940  ? multivitamin liquid 15 mL  15 mL Oral Daily Freddi Starr, MD   15 mL at 12/24/21 3704  ? ondansetron (ZOFRAN) tablet 4 mg  4 mg Oral Q6H PRN Darliss Cheney, MD      ? Or  ? ondansetron (ZOFRAN) injection 4 mg  4 mg Intravenous Q6H PRN Darliss Cheney, MD      ? oxyCODONE (Oxy IR/ROXICODONE) immediate release tablet 5 mg  5 mg Oral Q6H PRN Kathie Dike, MD   5 mg at 12/23/21 1157  ? polyethylene glycol (MIRALAX / GLYCOLAX) packet 17 g  17 g Oral Daily Corey Harold, NP   17 g at 12/23/21 1021  ? [START ON 12/25/2021] predniSONE (DELTASONE) tablet 30 mg  30 mg Oral Q breakfast Kc, Ramesh, MD      ? Followed by  ? Derrill Memo ON 12/28/2021] predniSONE (DELTASONE) tablet 20 mg  20 mg  Oral Q breakfast Kc, Ramesh, MD      ? Followed by  ? Derrill Memo ON 12/31/2021] predniSONE (DELTASONE) tablet 10 mg  10 mg Oral Q breakfast Kc, Ramesh, MD      ? QUEtiapine (SEROQUEL) tablet 25 mg  25 mg Oral QHS

## 2021-12-24 NOTE — Consult Note (Signed)
[facility] CM Inpatient Consult ? ? ?12/24/2021 ? ?Ocie Bob Koenigs ?[birth year] ?[id number] ? ?[place] [facility] CM) ?  ?Patient was reviewed with noted extreme high risk score for unplanned readmission. Assessed for post hospital chronic care coordination needs. Per review, current recommendation is for SNF. ? ?Plan: Will continue to follow for progression and disposition plans. ? ?Of note, [facility] Care Management services does not replace or interfere with any services that are arranged by inpatient case management or social work.  ? ?Netta Cedars, MSN, RN ?[facility] Liaison ?Toll free office [phone] ?

## 2021-12-24 NOTE — Progress Notes (Signed)
?PROGRESS NOTE ?Shannon [name]  BJS:[id number] DOB: 1956/06/09 DOA: 12/17/2021 ?PCP: [name], MD  ? ?Brief Narrative/Hospital Course: ?66 year old female with a history of metastatic adenocarcinoma of lung, COPD, HIV, was admitted to the hospital with right lower extremity wound, necrotizing fascitis,seen by orthopedics and underwent incision and drainage.  He was noted to have fungemia.  Infectious disease following for antibiotics/antifungals.  Overall, her condition worsened with worsening mental status and respiratory status.  She required intubation and transfer to ICU.  She was treated for COPD exacerbation and since then, respiratory status has stabilized and she was extubated on 5/1.  Orthopedics following for gas gangrene/necrotizing fasciitis of her right lower extremity and plans are for amputation.She will be discharged to skilled nursing facility when she is medically stabilized.  She was transferred to [facility] service on 5/2 ? ?4/26: admitted, I&D of LLE ?4/28: started on bipap, altered mentation ?4/29: I&D of RUE, transferred to ICU and intubated ?Off Vent 5/1 and to [place] 5/2 ? ?  ?Subjective: ?Seen and examined this morning.  Alert awake pleasant resting comfortably.  Right leg on dressing, on 4 L nasal cannula and used BiPAP last night reports has been using intermittently at daytime too-does not have BiPAP at home.  Overnight blood pressure running on higher side. ?  ?Assessment and Plan: ?Principal Problem: ?  Abscess of right foot ?Active Problems: ?  HIV disease ([place]) ?  Uncontrolled hypertension ?  Morbid (severe) obesity due to excess calories ([place]) ?  Atrial flutter ([place]) ?  Chronic respiratory failure with hypoxia (HCC) ?  Chronic anticoagulation ?  (HFpEF) heart failure with preserved ejection fraction ([place]) ?  Adenocarcinoma of lung ([place]) ?  Abscess of foot ?  Necrotizing fasciitis ([place]) ?  Pressure injury of skin ?  Fungemia ?  Acute respiratory failure with hypercapnia (HCC) ?   Macrocytic anemia ?  ?Acute on chronic respiratory failure with hypoxia and hypercapnia: ?Acute COPD exacerbation: ?Initially needing intubation due to AMS on BiPAP.  Off vent 5/1, currently on 4 L nasal cannula, BiPAP bedtime and intermittently, on IV Solu-Medrol 40, changed to p.o. and taper, continue Pulmicort/Brovana, nebulizer, supplemental oxygen and wean as tolerated.  Seen by PCCM ? ?Necrotizing fasciitis ?Sepsis due to Candididemia and Klebsiella soft tissue infection of right lower extremity right upper extremity: ?S/P I&D, orthopedics Dr. Sharol Given following recommending AKA later this week.  Continue Unasyn, continue Diflucan candidemia likely due to wound infection translocation, once stable will need TEE to rule out endocarditis as per ID. ?Recent Labs  ?Lab 12/17/21 ?1120 12/17/21 ?2102 12/18/21 ?0323 12/20/21 ?0458 12/20/21 ?1022 12/20/21 ?1510 12/21/21 ?6073 12/22/21 ?0424 12/23/21 ?0408 12/24/21 ?0240  ?WBC  --   --    < > 6.3  --   --  4.8 5.3 5.6 4.7  ?LATICACIDVEN 2.2* 1.5  --   --  0.6 0.6  --   --   --   --   ? < > = values in this interval not displayed.  ?  ?Metastatic adenocarcinoma of the lung to bone.  Follow-up with oncology outpatient ? ?Acute metabolic encephalopathy: Multifactorial and also due to hypercapnea, resolved.  Intermittent BiPAP use still.  On Seroquel at bedtime.  Continue PT OT and supportive care ? ?AKI on CKD stage IIIa: Stable at 1.3.  Monitor ?Recent Labs  ?Lab 12/19/21 ?0346 12/20/21 ?0458 12/21/21 ?7106 12/23/21 ?1114 12/24/21 ?0240  ?BUN 34* 29* 27* 37* 45*  ?CREATININE 1.60* 1.46* 1.45* 1.42* 1.31*  ?  ?  Chronic diastolic CHF:  ?Essential hypertension: ?Currently compensated chf. BP Uncontrolled on Cardizem 60 q6h, aldactone,metoprolol was added 5/2- will increase dose. ? ?Atrial flutter: Rate controlled continue Cardizem and metoprolol and anticoagulation with Lovenox> transition to Xarelto once surgery completed. ? ?HIV disease: Continue home regimen, ID  following ? ?Macrocytic anemia: Likely multifactorial with anemia of chronic disease.  Check B12 level, monitor ?Recent Labs  ?Lab 12/20/21 ?0458 12/21/21 ?7619 12/22/21 ?0424 12/23/21 ?0408 12/24/21 ?0240  ?HGB 9.7* 9.1* 9.1* 8.3* 8.7*  ?HCT 31.9* 28.8* 28.8* 26.5* 27.4*  ?  ? ?Type 2 diabetes mellitus: Borderline HbA1c 7.3.  Blood sugar stable.  On SSI. ?Recent Labs  ?Lab 12/17/21 ?2102 12/18/21 ?0414 12/23/21 ?1143 12/23/21 ?1547 12/23/21 ?1701 12/23/21 ?2132 12/24/21 ?0818  ?GLUCAP  --    < > 105* 251* 208* 162* 122*  ?HGBA1C 7.3*  --   --   --   --   --   --   ? < > = values in this interval not displayed.  ?  ? ?Morbid obesity:Patient's Body mass index is 40.87 kg/m?. : Will benefit with PCP follow-up, weight loss  healthy lifestyle and outpatient sleep evaluation-with pulmonary. ? ?Deconditioning/debility continue PT OT will need placement postsurgery ? ?Pressure ulcers stage I right buttock POA- see below ?Pressure Injury 12/20/21 Buttocks Right Stage 1 -  Intact skin with non-blanchable redness of a localized area usually over a bony prominence. (Active)  ?12/20/21 1600  ?Location: Buttocks  ?Location Orientation: Right  ?Staging: Stage 1 -  Intact skin with non-blanchable redness of a localized area usually over a bony prominence.  ?Wound Description (Comments):   ?Present on Admission: Yes  ?Dressing Type Foam - Lift dressing to assess site every shift 12/23/21 0800  ? ?DVT prophylaxis: SCD's Start: 12/20/21 1327 ?Code Status:   Code Status: Full Code ?Family Communication: plan of care discussed with patient at bedside. ?Patient status is: inpatient  Level of care: Stepdown  ?Remains inpatient because: Ongoing management of patient's leg infection and fungemia ?Patient currently not stable ? ?Dispo: The patient is from: Home ?           Anticipated disposition: Skilled nursing facility ? ?Mobility Assessment (last 72 hours)   ? ? Mobility Assessment   ? ? [place] Name 12/23/21 1200 12/23/21 1013  ?  ?  ?  ?  What is the highest level of mobility based on the progressive mobility assessment? Level 2 ([place]) - Balance while sitting on edge of bed and cannot stand Level 2 ([place]) - Balance while sitting on edge of bed and cannot stand     ? ?  ?  ? ?  ?  ? ?Objective: ?Vitals last 24 hrs: ?Vitals:  ? 12/24/21 0600 12/24/21 0645 12/24/21 0700 12/24/21 0835  ?BP:  (!) 164/85 (!) 166/69   ?Pulse: 87 77 81   ?Resp: 19 15 (!) 24   ?Temp:      ?TempSrc:      ?SpO2: (!) 89% 98% 99% 99%  ?Weight:      ?Height:      ? ?Weight change: -1 kg ? ?Physical Examination: ?General exam: AA oriented, morbidly obese,older than stated age, weak appearing. ?HEENT:Oral mucosa moist, Ear/Nose WNL grossly, dentition normal. ?Respiratory system: bilaterally diminished BS, no use of accessory muscle ?Cardiovascular system: S1 & S2 +, No JVD,. ?Gastrointestinal system: Abdomen soft,NT,ND, BS+ ?Nervous System:Alert, awake, moving extremities and grossly nonfocal ?Extremities: LE edema chronic appearing, right leg with dressing in place  ?  Skin: No rashes,no icterus. ?MSK: Normal muscle bulk,tone, power ? ?Medications reviewed:  ?Scheduled Meds: ? (feeding supplement) PROSource Plus  30 mL Oral BID BM  ? arformoterol  15 mcg Nebulization BID  ? vitamin C  500 mg Oral BID  ? bictegravir-emtricitabine-tenofovir AF  1 tablet Oral Daily  ? budesonide (PULMICORT) nebulizer solution  0.5 mg Nebulization BID  ? chlorhexidine  15 mL Mouth Rinse BID  ? Chlorhexidine Gluconate Cloth  6 each Topical Daily  ? diltiazem  60 mg Oral Q6H  ? enoxaparin (LOVENOX) injection  100 mg Subcutaneous Q12H  ? famotidine  20 mg Oral Daily  ? feeding supplement  237 mL Oral BID BM  ? fentaNYL (SUBLIMAZE) injection  50 mcg Intravenous Once  ? insulin aspart  0-15 Units Subcutaneous TID WC  ? insulin aspart  0-5 Units Subcutaneous QHS  ? mouth rinse  15 mL Mouth Rinse q12n4p  ? methylPREDNISolone (SOLU-MEDROL) injection  40 mg Intravenous Daily  ? metoprolol tartrate  25  mg Oral BID  ? multivitamin  15 mL Oral Daily  ? polyethylene glycol  17 g Oral Daily  ? QUEtiapine  25 mg Oral QHS  ? revefenacin  175 mcg Nebulization Daily  ? sodium chloride flush  10-40 mL Intracatheter Q12H  ? spiron

## 2021-12-25 DIAGNOSIS — B49 Unspecified mycosis: Secondary | ICD-10-CM | POA: Diagnosis not present

## 2021-12-25 DIAGNOSIS — B20 Human immunodeficiency virus [HIV] disease: Secondary | ICD-10-CM | POA: Diagnosis not present

## 2021-12-25 DIAGNOSIS — L02611 Cutaneous abscess of right foot: Secondary | ICD-10-CM | POA: Diagnosis not present

## 2021-12-25 DIAGNOSIS — M726 Necrotizing fasciitis: Secondary | ICD-10-CM | POA: Diagnosis not present

## 2021-12-25 DIAGNOSIS — C3492 Malignant neoplasm of unspecified part of left bronchus or lung: Secondary | ICD-10-CM | POA: Diagnosis not present

## 2021-12-25 LAB — CBC WITH DIFFERENTIAL/PLATELET
Abs Immature Granulocytes: 0.11 10*3/uL — ABNORMAL HIGH (ref 0.00–0.07)
Basophils Absolute: 0 10*3/uL (ref 0.0–0.1)
Basophils Relative: 0 %
Eosinophils Absolute: 0 10*3/uL (ref 0.0–0.5)
Eosinophils Relative: 0 %
HCT: 28.3 % — ABNORMAL LOW (ref 36.0–46.0)
Hemoglobin: 8.6 g/dL — ABNORMAL LOW (ref 12.0–15.0)
Immature Granulocytes: 2 %
Lymphocytes Relative: 6 %
Lymphs Abs: 0.4 10*3/uL — ABNORMAL LOW (ref 0.7–4.0)
MCH: 31.6 pg (ref 26.0–34.0)
MCHC: 30.4 g/dL (ref 30.0–36.0)
MCV: 104 fL — ABNORMAL HIGH (ref 80.0–100.0)
Monocytes Absolute: 0.4 10*3/uL (ref 0.1–1.0)
Monocytes Relative: 6 %
Neutro Abs: 6.2 10*3/uL (ref 1.7–7.7)
Neutrophils Relative %: 86 %
Platelets: 115 10*3/uL — ABNORMAL LOW (ref 150–400)
RBC: 2.72 MIL/uL — ABNORMAL LOW (ref 3.87–5.11)
RDW: 13.9 % (ref 11.5–15.5)
WBC: 7.2 10*3/uL (ref 4.0–10.5)
nRBC: 0.6 % — ABNORMAL HIGH (ref 0.0–0.2)

## 2021-12-25 LAB — GLUCOSE, CAPILLARY
Glucose-Capillary: 120 mg/dL — ABNORMAL HIGH (ref 70–99)
Glucose-Capillary: 151 mg/dL — ABNORMAL HIGH (ref 70–99)
Glucose-Capillary: 209 mg/dL — ABNORMAL HIGH (ref 70–99)

## 2021-12-25 LAB — AEROBIC/ANAEROBIC CULTURE W GRAM STAIN (SURGICAL/DEEP WOUND)
Culture: NO GROWTH
Gram Stain: NONE SEEN

## 2021-12-25 MED ORDER — ADULT MULTIVITAMIN W/MINERALS CH
1.0000 | ORAL_TABLET | Freq: Every day | ORAL | Status: DC
  Administered 2021-12-25 – 2022-01-14 (×20): 1 via ORAL
  Filled 2021-12-25 (×20): qty 1

## 2021-12-25 MED ORDER — DILTIAZEM HCL 30 MG PO TABS
90.0000 mg | ORAL_TABLET | Freq: Four times a day (QID) | ORAL | Status: AC
  Administered 2021-12-25 – 2021-12-30 (×20): 90 mg via ORAL
  Filled 2021-12-25 (×13): qty 1
  Filled 2021-12-25: qty 3
  Filled 2021-12-25 (×4): qty 1

## 2021-12-25 MED ORDER — OXYCODONE HCL 5 MG PO TABS
5.0000 mg | ORAL_TABLET | ORAL | Status: DC | PRN
  Administered 2021-12-25 – 2021-12-29 (×5): 5 mg via ORAL
  Filled 2021-12-25 (×5): qty 1

## 2021-12-25 NOTE — Progress Notes (Signed)
Pt refused bipap tonight, prefers nasal cannula.  Pt is awake, alert, no increased wob, respiratory distress noted or voiced by pt at this time.  Bipap remains in room on standby. ?

## 2021-12-25 NOTE — Progress Notes (Signed)
?PROGRESS NOTE ?Shannon [name]  UEA:[id number] DOB: 11-25-1955 DOA: 12/17/2021 ?PCP: [name], MD  ? ?Brief Narrative/Hospital Course: ?66 year old female with a history of metastatic adenocarcinoma of lung, COPD, HIV, was admitted to the hospital with right lower extremity wound, necrotizing fascitis,seen by orthopedics and underwent incision and drainage.  He was noted to have fungemia.  Infectious disease following for antibiotics/antifungals.  Overall, her condition worsened with worsening mental status and respiratory status.  She required intubation and transfer to ICU.  She was treated for COPD exacerbation and since then, respiratory status has stabilized and she was extubated on 5/1.  Orthopedics following for gas gangrene/necrotizing fasciitis of her right lower extremity and plans are for amputation.She will be discharged to skilled nursing facility when she is medically stabilized.  She was transferred to [facility] service on 5/2 ? ?4/26: admitted, I&D of LLE ?4/28: started on bipap, altered mentation ?4/29: I&D of RUE, transferred to ICU and intubated ?Off Vent 5/1 and to [place] 5/2 ? ?  ?Subjective: ?Seen and examined this morning.  Resting comfortably has no complaints. ?She reports she wants to get it done with this infection with surgery. ?Afebrile overnight and BP running high ?  ?Assessment and Plan: ?Principal Problem: ?  Abscess of right foot ?Active Problems: ?  HIV disease ([place]) ?  Uncontrolled hypertension ?  Morbid (severe) obesity due to excess calories ([place]) ?  Atrial flutter ([place]) ?  Chronic respiratory failure with hypoxia (HCC) ?  Chronic anticoagulation ?  (HFpEF) heart failure with preserved ejection fraction ([place]) ?  Adenocarcinoma of lung ([place]) ?  Abscess of foot ?  Necrotizing fasciitis ([place]) ?  Pressure injury of skin ?  Fungemia ?  Acute respiratory failure with hypercapnia (HCC) ?  Macrocytic anemia ?  ?Acute on chronic respiratory failure with hypoxia and hypercapnia: ?Acute COPD  exacerbation: ?Initially needing intubation due to AMS on BiPAP.  Off vent 5/1, now on 2l , BiPAP bedtime and intermittently daytime. Cont on prednisone taper,Pulmicort/Brovana, nebulizer, supplemental oxygen and wean as tolerated.  Seen by PCCM.  Encourage PT OT, IS. ? ?Necrotizing fasciitis right lower extremity status post I&D ?Sepsis due to Candididemia and Klebsiella soft tissue infection of right lower extremity right upper extremity: ?S/P I&D, orthopedics Dr. Sharol Given following recommending AKA later this week-patient wants to discuss with family and decide on Friday.Continue Unasyn, Diflucan- candidemia likely due to wound infection translocation, once stable will need TEE to rule out endocarditis and ophthalmology to rule out endophthalmitis as per ID. ?Recent Labs  ?Lab 12/20/21 ?1022 12/20/21 ?1510 12/21/21 ?4782 12/22/21 ?0424 12/23/21 ?0408 12/24/21 ?9562 12/25/21 ?0453  ?WBC  --   --  4.8 5.3 5.6 4.7 7.2  ?LATICACIDVEN 0.6 0.6  --   --   --   --   --   ?  ?Metastatic adenocarcinoma of the lung to bone. Follow-up with oncology outpatient ? ?Acute metabolic encephalopathy: Multifactorial and also due to hypercapnea.this has resolved.  I continue Seroquel at bedtime.  Continue PT OT and supportive care ? ?AKI on CKD stage IIIa: Renal function is stable .3.  Monitor ?Recent Labs  ?Lab 12/19/21 ?0346 12/20/21 ?0458 12/21/21 ?1308 12/23/21 ?1114 12/24/21 ?0240  ?BUN 34* 29* 27* 37* 45*  ?CREATININE 1.60* 1.46* 1.45* 1.42* 1.31*  ?  ?Chronic diastolic CHF:  ?Essential hypertension: ?Currently compensated chf. BP Uncontrolled -increase Cardizem 60> 90 gm q6h, cont aldactone, and increased dose of metoprolol (added 5/2).  Monitor and adjust medication. ? ?Atrial flutter:At times SVT  in 150s.Rate controlled continue on Cardizem metoprolol as ordered  cont anticoagulation with Lovenox> transition to Xarelto once surgery completed. ? ?HIV disease: Continue home regimen, ID following ? ?Macrocytic anemia: Likely  multifactorial with anemia of chronic disease.  Stable-high B12 level, normal folic acid.  Monitor ?Recent Labs  ?Lab 12/21/21 ?2952 12/22/21 ?0424 12/23/21 ?0408 12/24/21 ?8413 12/25/21 ?0453  ?HGB 9.1* 9.1* 8.3* 8.7* 8.6*  ?HCT 28.8* 28.8* 26.5* 27.4* 28.3*  ?Type 2 diabetes mellitus: Borderline HbA1c 7.3.  Blood sugar borderline controlled, continue SSI. ?Recent Labs  ?Lab 12/24/21 ?0818 12/24/21 ?1149 12/24/21 ?1642 12/24/21 ?2140 12/25/21 ?0811  ?GLUCAP 122* 183* 231* 178* 120*  ?Morbid obesity:Patient's Body mass index is 41.74 kg/m?. : Will benefit with PCP follow-up, weight loss  healthy lifestyle and outpatient sleep evaluation-with pulmonary. ? ?Deconditioning/debility continue PT OT will need placement postsurgery ? ?Pressure ulcers stage I right buttock POA- see below ?Pressure Injury 12/20/21 Buttocks Right Stage 1 -  Intact skin with non-blanchable redness of a localized area usually over a bony prominence. (Active)  ?12/20/21 1600  ?Location: Buttocks  ?Location Orientation: Right  ?Staging: Stage 1 -  Intact skin with non-blanchable redness of a localized area usually over a bony prominence.  ?Wound Description (Comments):   ?Present on Admission: Yes  ?Dressing Type Foam - Lift dressing to assess site every shift 12/23/21 0800  ? ?DVT prophylaxis: SCD's Start: 12/20/21 1327 ?Code Status:   Code Status: Full Code ?Family Communication: plan of care discussed with patient at bedside. ?Patient status is: inpatient  Level of care: Stepdown  ?Remains inpatient because: Ongoing management of patient's leg infection and fungemia ?Patient currently not stable ? ?Dispo: The patient is from: Home ?           Anticipated disposition: Skilled nursing facility ? ?Mobility Assessment (last 72 hours)   ? ? Mobility Assessment   ? ? [place] Name 12/24/21 2000 12/23/21 1200 12/23/21 1013  ?  ?  ? Does patient have an order for bedrest or is patient medically unstable Yes- Bedfast (Level 1) - Complete -- --    ? What is  the highest level of mobility based on the progressive mobility assessment? -- Level 2 (Chair[place]) - Balance while sitting on edge of bed and cannot stand Level 2 (Chairfast) - Balance while sitting on edge of bed and cannot stand    ? ?  ?  ? ?  ?  ? ?Objective: ?Vitals last 24 hrs: ?Vitals:  ? 12/25/21 0700 12/25/21 0720 12/25/21 0800 12/25/21 0900  ?BP: (!) 175/61  (!) 188/62 (!) 177/63  ?Pulse: 73  77 76  ?Resp: 18  19 17   ?Temp:  98.3 ?F (36.8 ?C)    ?TempSrc:  Axillary    ?SpO2: 94%  93% 94%  ?Weight:      ?Height:      ? ?Weight change: 2.3 kg ? ?Physical Examination: ?General exam: AA OX3, pleasant, obese,older than stated age, weak appearing. ?HEENT:Oral mucosa moist, Ear/Nose WNL grossly, dentition normal. ?Respiratory system: bilaterally diminished,no use of accessory muscle ?Cardiovascular system: S1 & S2 +, No JVD,. ?Gastrointestinal system: Abdomen soft,NT,ND, BS+ ?Nervous System:Alert, awake, moving extremities and grossly nonfocal ?Extremities: Right foot with dressing in place, distal peripheral pulses palpable.  ?Skin: No rashes,no icterus. ?MSK: Normal muscle bulk,tone, power ? ? ?Medications reviewed:  ?Scheduled Meds: ? (feeding supplement) PROSource Plus  30 mL Oral BID BM  ? arformoterol  15 mcg Nebulization BID  ? vitamin C  500 mg  Oral BID  ? bictegravir-emtricitabine-tenofovir AF  1 tablet Oral Daily  ? budesonide (PULMICORT) nebulizer solution  0.5 mg Nebulization BID  ? chlorhexidine  15 mL Mouth Rinse BID  ? Chlorhexidine Gluconate Cloth  6 each Topical Daily  ? diltiazem  90 mg Oral Q6H  ? famotidine  20 mg Oral Daily  ? feeding supplement  237 mL Oral BID BM  ? insulin aspart  0-15 Units Subcutaneous TID WC  ? insulin aspart  0-5 Units Subcutaneous QHS  ? mouth rinse  15 mL Mouth Rinse q12n4p  ? metoprolol tartrate  50 mg Oral BID  ? multivitamin with minerals  1 tablet Oral Daily  ? polyethylene glycol  17 g Oral Daily  ? predniSONE  30 mg Oral Q breakfast  ? Followed by  ? [START ON  12/28/2021] predniSONE  20 mg Oral Q breakfast  ? Followed by  ? [START ON 12/31/2021] predniSONE  10 mg Oral Q breakfast  ? QUEtiapine  25 mg Oral QHS  ? revefenacin  175 mcg Nebulization Daily  ? sodium chl

## 2021-12-25 NOTE — Progress Notes (Signed)
? ? ?[facility] for Infectious Disease ? ?Date of Admission:  12/17/2021    ? ?Total days of antibiotics  ?        ?ASSESSMENT: ? ?Shannon [name] is agreeable to continue with surgery as this will provide source control and allow for more rapid recovery to allow for cancer treatments to proceed as directed by Oncology when appropriate. Will need TEE to rule out endocarditis and will defer timing to primary team. HIV has remained stable and will continue with Biktarvy supplemented with Bactrim for OI prophylaxis. Remaining medical and supportive care per primary team.  ? ?PLAN: ? ?Continue fluconazole for fungemia ?Surgery planned for tomorrow with Dr. Sharol Given ?Continue Unasyn.  ?Continue Biktarvy and Bactrim. ?Will need TEE.  ?Eye exam recommended and can be done outpatient.  ?Remaining medical and supportive care per primary team.  ? ?Principal Problem: ?  Abscess of right foot ?Active Problems: ?  HIV disease ([place]) ?  Uncontrolled hypertension ?  Morbid (severe) obesity due to excess calories ([place]) ?  Atrial flutter ([place]) ?  Chronic respiratory failure with hypoxia (HCC) ?  Chronic anticoagulation ?  (HFpEF) heart failure with preserved ejection fraction ([place]) ?  Adenocarcinoma of lung ([place]) ?  Abscess of foot ?  Necrotizing fasciitis ([employer]) ?  Pressure injury of skin ?  Fungemia ?  Acute respiratory failure with hypercapnia (HCC) ?  Macrocytic anemia ? ? ? (feeding supplement) PROSource Plus  30 mL Oral BID BM  ? arformoterol  15 mcg Nebulization BID  ? vitamin C  500 mg Oral BID  ? bictegravir-emtricitabine-tenofovir AF  1 tablet Oral Daily  ? budesonide (PULMICORT) nebulizer solution  0.5 mg Nebulization BID  ? chlorhexidine  15 mL Mouth Rinse BID  ? Chlorhexidine Gluconate Cloth  6 each Topical Daily  ? diltiazem  90 mg Oral Q6H  ? famotidine  20 mg Oral Daily  ? feeding supplement  237 mL Oral BID BM  ? insulin aspart  0-15 Units Subcutaneous TID WC  ? insulin aspart  0-5 Units Subcutaneous QHS  ? mouth rinse  15  mL Mouth Rinse q12n4p  ? metoprolol tartrate  50 mg Oral BID  ? multivitamin with minerals  1 tablet Oral Daily  ? polyethylene glycol  17 g Oral Daily  ? predniSONE  30 mg Oral Q breakfast  ? Followed by  ? [START ON 12/28/2021] predniSONE  20 mg Oral Q breakfast  ? Followed by  ? [START ON 12/31/2021] predniSONE  10 mg Oral Q breakfast  ? QUEtiapine  25 mg Oral QHS  ? revefenacin  175 mcg Nebulization Daily  ? sodium chloride flush  10-40 mL Intracatheter Q12H  ? spironolactone  25 mg Oral Daily  ? zinc sulfate  220 mg Oral Daily  ? ? ?SUBJECTIVE: ? ?Afebrile overnight with no acute events. Agreeable to surgery.  ? ?Allergies  ?Allergen Reactions  ? Lisinopril Swelling and Cough  ?  Face/throat swelling  ? Tree Extract Swelling and Other (See Comments)  ?  Swelling to eyes  ? Augmentin [Amoxicillin-Pot Clavulanate] Other (See Comments)  ?  Headache, dizzy  ? Ciprofloxacin Hives  ? ? ? ?Review of Systems: ?Review of Systems  ?Constitutional:  Negative for chills, fever and weight loss.  ?Respiratory:  Negative for cough, shortness of breath and wheezing.   ?Cardiovascular:  Negative for chest pain and leg swelling.  ?Gastrointestinal:  Negative for abdominal pain, constipation, diarrhea, nausea and vomiting.  ?Skin:  Negative for rash.  ? ? ? ?  OBJECTIVE: ?Vitals:  ? 12/25/21 0400 12/25/21 0500 12/25/21 0600 12/25/21 0700  ?BP: (!) 162/59 (!) 168/68 (!) 175/71 (!) 175/61  ?Pulse: 65 68 68 73  ?Resp: 17 17 (!) 22 18  ?Temp: 98.2 ?F (36.8 ?C)     ?TempSrc: Oral     ?SpO2: 97% 98% 97% 94%  ?Weight:  110.3 kg    ?Height:      ? ?Body mass index is 41.74 kg/m?. ? ?Physical Exam ?Constitutional:   ?   General: She is not in acute distress. ?   Appearance: She is well-developed.  ?Cardiovascular:  ?   Rate and Rhythm: Normal rate and regular rhythm.  ?   Heart sounds: Normal heart sounds.  ?Pulmonary:  ?   Effort: Pulmonary effort is normal.  ?   Breath sounds: Normal breath sounds.  ?Skin: ?   General: Skin is warm and dry.   ?Neurological:  ?   Mental Status: She is alert and oriented to person, place, and time.  ?Psychiatric:     ?   Behavior: Behavior normal.     ?   Thought Content: Thought content normal.     ?   Judgment: Judgment normal.  ? ? ?Lab Results ?Lab Results  ?Component Value Date  ? WBC 7.2 12/25/2021  ? HGB 8.6 (L) 12/25/2021  ? HCT 28.3 (L) 12/25/2021  ? MCV 104.0 (H) 12/25/2021  ? PLT 115 (L) 12/25/2021  ?  ?Lab Results  ?Component Value Date  ? CREATININE 1.31 (H) 12/24/2021  ? BUN 45 (H) 12/24/2021  ? NA 141 12/24/2021  ? K 4.1 12/24/2021  ? CL 111 12/24/2021  ? CO2 28 12/24/2021  ?  ?Lab Results  ?Component Value Date  ? ALT 38 12/18/2021  ? AST 18 12/18/2021  ? ALKPHOS 73 12/18/2021  ? BILITOT 0.4 12/18/2021  ?  ? ?Microbiology: ?Recent Results (from the past 240 hour(s))  ?Blood culture (routine x 2)     Status: Abnormal (Preliminary result)  ? Collection Time: 12/17/21 11:19 AM  ? Specimen: Left Antecubital; Blood  ?Result Value Ref Range Status  ? Specimen Description   Final  ?  LEFT ANTECUBITAL ?Performed at [facility], [place] [address], [place], [place] [address] ?  ? Special Requests   Final  ?  BLOOD Blood Culture adequate volume ?Performed at [facility], [place] [address] [place], [place] [address] ?  ? Culture  Setup Time   Final  ?  YEAST ?AEROBIC BOTTLE ONLY ?CRITICAL RESULT CALLED TO, READ BACK BY AND VERIFIED WITH: [place] ON 12/19/21 @ 1723 BY DRT ?  ? Culture (A)  Final  ?  CANDIDA PARAPSILOSIS ?Sent to [place] for further susceptibility testing. ?Performed at [facility], [place] [address], [place], [place] [address] ?  ? Report Status PENDING  Incomplete  ?Blood Culture ID Panel (Reflexed)     Status: Abnormal  ? Collection Time: 12/17/21 11:19 AM  ?Result Value Ref Range Status  ? Enterococcus faecalis NOT DETECTED NOT DETECTED Final  ? Enterococcus Faecium NOT DETECTED NOT DETECTED Final  ? Listeria monocytogenes NOT DETECTED NOT DETECTED  Final  ? Staphylococcus species NOT DETECTED NOT DETECTED Final  ? Staphylococcus aureus (BCID) NOT DETECTED NOT DETECTED Final  ? Staphylococcus epidermidis NOT DETECTED NOT DETECTED Final  ? Staphylococcus lugdunensis NOT DETECTED NOT DETECTED Final  ? Streptococcus species NOT DETECTED NOT DETECTED Final  ? Streptococcus agalactiae NOT DETECTED NOT DETECTED Final  ?  Streptococcus pneumoniae NOT DETECTED NOT DETECTED Final  ? Streptococcus pyogenes NOT DETECTED NOT DETECTED Final  ? A.calcoaceticus-baumannii NOT DETECTED NOT DETECTED Final  ? Bacteroides fragilis NOT DETECTED NOT DETECTED Final  ? Enterobacterales NOT DETECTED NOT DETECTED Final  ? Enterobacter cloacae complex NOT DETECTED NOT DETECTED Final  ? Escherichia coli NOT DETECTED NOT DETECTED Final  ? Klebsiella aerogenes NOT DETECTED NOT DETECTED Final  ? Klebsiella oxytoca NOT DETECTED NOT DETECTED Final  ? Klebsiella pneumoniae NOT DETECTED NOT DETECTED Final  ? Proteus species NOT DETECTED NOT DETECTED Final  ? Salmonella species NOT DETECTED NOT DETECTED Final  ? Serratia marcescens NOT DETECTED NOT DETECTED Final  ? Haemophilus influenzae NOT DETECTED NOT DETECTED Final  ? Neisseria meningitidis NOT DETECTED NOT DETECTED Final  ? Pseudomonas aeruginosa NOT DETECTED NOT DETECTED Final  ? Stenotrophomonas maltophilia NOT DETECTED NOT DETECTED Final  ? Candida albicans NOT DETECTED NOT DETECTED Final  ? Candida auris NOT DETECTED NOT DETECTED Final  ? Candida glabrata NOT DETECTED NOT DETECTED Final  ? Candida krusei NOT DETECTED NOT DETECTED Final  ? Candida parapsilosis DETECTED (A) NOT DETECTED Final  ?  Comment: CRITICAL RESULT CALLED TO, READ BACK BY AND VERIFIED WITH: ?PHARMD MELISSA JAMES ON 12/19/21 @ 1723 BY DRT ?  ? Candida tropicalis NOT DETECTED NOT DETECTED Final  ? Cryptococcus neoformans/gattii NOT DETECTED NOT DETECTED Final  ?  Comment: Performed at [facility], [name] [place], [place] [address]  ?Antifungal AST 9 Drug  Panel     Status: None (Preliminary result)  ? Collection Time: 12/17/21 11:19 AM  ?Result Value Ref Range Status  ? Organism ID, Yeast Preliminary report  Final  ?  Comment: (NOTE) ?Specimen has bee

## 2021-12-25 NOTE — Progress Notes (Signed)
ANTICOAGULATION CONSULT NOTE - Follow Up Consult ? ?Pharmacy Consult for Lovenox ?Indication: h/o afib and PE ? ?Allergies  ?Allergen Reactions  ? Lisinopril Swelling and Cough  ?  Face/throat swelling  ? Tree Extract Swelling and Other (See Comments)  ?  Swelling to eyes  ? Augmentin [Amoxicillin-Pot Clavulanate] Other (See Comments)  ?  Headache, dizzy  ? Ciprofloxacin Hives  ? ? ?Patient Measurements: ?Height: 5\' 4"  (162.6 cm) ?Weight: 110.3 kg (243 lb 2.7 oz) ?IBW/kg (Calculated) : 54.7 ? ?Vital Signs: ?Temp: 98.2 ?F (36.8 ?C) (05/04 0400) ?Temp Source: Oral (05/04 0400) ?BP: 175/61 (05/04 0700) ?Pulse Rate: 73 (05/04 0700) ? ?Labs: ?Recent Labs  ?  12/23/21 ?0408 12/23/21 ?1114 12/24/21 ?6301 12/25/21 ?0453  ?HGB 8.3*  --  8.7* 8.6*  ?HCT 26.5*  --  27.4* 28.3*  ?PLT 135*  --  120* 115*  ?CREATININE  --  1.42* 1.31*  --   ? ? ? ?Estimated Creatinine Clearance: 50.6 mL/min (A) (by C-G formula based on SCr of 1.31 mg/dL (H)). ? ? ?Assessment: ?49 yoF admitted on 4/26 with RLE wound s/p I&D of necrotizing fascitis.  PMH of afib and PE on xarelto 20 mg daily at home (LD 4/25 AM).  Pharmacy is consulted to dose Lovenox inpatient. Ortho is planning AKA.   ? ?Today, 12/25/2021: ?CBC:  Hgb low/decreased to 8.6, Plt down to 115 ?No reported bleeding ?SCr down to 1.3, CrCl ~ 50 ml/min. ? ?Goal of Therapy:  ?Anti-Xa level 0.6-1 units/ml 4hrs after LMWH dose given ?Monitor platelets by anticoagulation protocol: Yes ?  ?Plan:  ?Lovenox 100 mg Q12 ?D/C Lovenox order on 5/4 in preparation for AKA surgery on 5/5 per Dr. Sharol Given ?Follow up anticoagulation plans after surgery  ?Continue to monitor low Hgb and Plts - ? Due to pt's underlying disease states vs antibiotic related vs anticoag related ? ? ? ?Gretta Arab PharmD, BCPS ?Clinical Pharmacist ?[facility] main [facility] [phone] ?12/25/2021 9:04 AM ? ?

## 2021-12-25 NOTE — Plan of Care (Signed)
?  Problem: Education: ?Goal: Knowledge of disease or condition will improve ?Outcome: Progressing ?Goal: Understanding of medication regimen will improve ?Outcome: Progressing ?Goal: Individualized Educational Video(s) ?Outcome: Progressing ?  ?Problem: Activity: ?Goal: Ability to tolerate increased activity will improve ?Outcome: Progressing ?  ?Problem: Cardiac: ?Goal: Ability to achieve and maintain adequate cardiopulmonary perfusion will improve ?Outcome: Progressing ?  ?Problem: Health Behavior/Discharge Planning: ?Goal: Ability to safely manage health-related needs after discharge will improve ?Outcome: Progressing ?  ?Problem: Education: ?Goal: Knowledge of General Education information will improve ?Description: Including pain rating scale, medication(s)/side effects and non-pharmacologic comfort measures ?Outcome: Progressing ?  ?Problem: Health Behavior/Discharge Planning: ?Goal: Ability to manage health-related needs will improve ?Outcome: Progressing ?  ?Problem: Clinical Measurements: ?Goal: Ability to maintain clinical measurements within normal limits will improve ?Outcome: Progressing ?Goal: Will remain free from infection ?Outcome: Progressing ?Goal: Diagnostic test results will improve ?Outcome: Progressing ?Goal: Respiratory complications will improve ?Outcome: Progressing ?Goal: Cardiovascular complication will be avoided ?Outcome: Progressing ?  ?Problem: Activity: ?Goal: Risk for activity intolerance will decrease ?Outcome: Progressing ?  ?Problem: Nutrition: ?Goal: Adequate nutrition will be maintained ?Outcome: Progressing ?  ?Problem: Coping: ?Goal: Level of anxiety will decrease ?Outcome: Progressing ?  ?Problem: Elimination: ?Goal: Will not experience complications related to bowel motility ?Outcome: Progressing ?Goal: Will not experience complications related to urinary retention ?Outcome: Progressing ?  ?Problem: Pain Managment: ?Goal: General experience of comfort will improve ?Outcome:  Progressing ?  ?Problem: Safety: ?Goal: Ability to remain free from injury will improve ?Outcome: Progressing ?  ?Problem: Skin Integrity: ?Goal: Risk for impaired skin integrity will decrease ?Outcome: Progressing ?  ?Problem: Safety: ?Goal: Non-violent Restraint(s) ?Outcome: Completed/Met ?  ?

## 2021-12-26 ENCOUNTER — Inpatient Hospital Stay (HOSPITAL_COMMUNITY): Payer: Medicare Other | Admitting: Anesthesiology

## 2021-12-26 ENCOUNTER — Encounter (HOSPITAL_COMMUNITY)
Admission: EM | Disposition: A | Discharge status: Skilled Nursing Facility | Payer: Self-pay | Source: Home / Self Care | Attending: Internal Medicine

## 2021-12-26 ENCOUNTER — Encounter (HOSPITAL_COMMUNITY): Payer: Self-pay | Admitting: Family Medicine

## 2021-12-26 DIAGNOSIS — I509 Heart failure, unspecified: Secondary | ICD-10-CM

## 2021-12-26 DIAGNOSIS — Z87891 Personal history of nicotine dependence: Secondary | ICD-10-CM

## 2021-12-26 DIAGNOSIS — L02611 Cutaneous abscess of right foot: Secondary | ICD-10-CM | POA: Diagnosis not present

## 2021-12-26 DIAGNOSIS — A480 Gas gangrene: Secondary | ICD-10-CM | POA: Diagnosis not present

## 2021-12-26 DIAGNOSIS — I110 Hypertensive heart disease with heart failure: Secondary | ICD-10-CM

## 2021-12-26 DIAGNOSIS — I2510 Atherosclerotic heart disease of native coronary artery without angina pectoris: Secondary | ICD-10-CM

## 2021-12-26 DIAGNOSIS — M726 Necrotizing fasciitis: Secondary | ICD-10-CM

## 2021-12-26 HISTORY — PX: AMPUTATION: SHX166

## 2021-12-26 LAB — GLUCOSE, CAPILLARY
Glucose-Capillary: 187 mg/dL — ABNORMAL HIGH (ref 70–99)
Glucose-Capillary: 127 mg/dL — ABNORMAL HIGH (ref 70–99)
Glucose-Capillary: 93 mg/dL (ref 70–99)
Glucose-Capillary: 110 mg/dL — ABNORMAL HIGH (ref 70–99)
Glucose-Capillary: 120 mg/dL — ABNORMAL HIGH (ref 70–99)
Glucose-Capillary: 133 mg/dL — ABNORMAL HIGH (ref 70–99)

## 2021-12-26 LAB — CBC WITH DIFFERENTIAL/PLATELET
Abs Immature Granulocytes: 0.15 10*3/uL — ABNORMAL HIGH (ref 0.00–0.07)
Basophils Absolute: 0 10*3/uL (ref 0.0–0.1)
Basophils Relative: 0 %
Eosinophils Absolute: 0 10*3/uL (ref 0.0–0.5)
Eosinophils Relative: 0 %
HCT: 28.5 % — ABNORMAL LOW (ref 36.0–46.0)
Hemoglobin: 9 g/dL — ABNORMAL LOW (ref 12.0–15.0)
Immature Granulocytes: 2 %
Lymphocytes Relative: 8 %
Lymphs Abs: 0.7 10*3/uL (ref 0.7–4.0)
MCH: 32.3 pg (ref 26.0–34.0)
MCHC: 31.6 g/dL (ref 30.0–36.0)
MCV: 102.2 fL — ABNORMAL HIGH (ref 80.0–100.0)
Monocytes Absolute: 0.5 10*3/uL (ref 0.1–1.0)
Monocytes Relative: 6 %
Neutro Abs: 7 10*3/uL (ref 1.7–7.7)
Neutrophils Relative %: 84 %
Platelets: 121 10*3/uL — ABNORMAL LOW (ref 150–400)
RBC: 2.79 MIL/uL — ABNORMAL LOW (ref 3.87–5.11)
RDW: 13.7 % (ref 11.5–15.5)
WBC: 8.4 10*3/uL (ref 4.0–10.5)
nRBC: 0.7 % — ABNORMAL HIGH (ref 0.0–0.2)

## 2021-12-26 LAB — BASIC METABOLIC PANEL
Anion gap: 6 (ref 5–15)
BUN: 41 mg/dL — ABNORMAL HIGH (ref 8–23)
CO2: 30 mmol/L (ref 22–32)
Calcium: 10.3 mg/dL (ref 8.9–10.3)
Chloride: 106 mmol/L (ref 98–111)
Creatinine, Ser: 1.11 mg/dL — ABNORMAL HIGH (ref 0.44–1.00)
GFR, Estimated: 54 mL/min — ABNORMAL LOW (ref >60)
Glucose, Bld: 140 mg/dL — ABNORMAL HIGH (ref 70–99)
Potassium: 4.4 mmol/L (ref 3.5–5.1)
Sodium: 142 mmol/L (ref 135–145)

## 2021-12-26 LAB — ANTIFUNGAL AST 9 DRUG PANEL
Amphotericin B MIC: 0.5
Anidulafungin MIC: 1
Caspofungin MIC: 0.5
Fluconazole Islt MIC: 1
Flucytosine MIC: 0.12
Itraconazole MIC: 0.06
Micafungin MIC: 1
Posaconazole MIC: 0.03

## 2021-12-26 LAB — GUARDANT 360

## 2021-12-26 SURGERY — AMPUTATION, ABOVE KNEE
Anesthesia: General | Site: Knee | Laterality: Right

## 2021-12-26 MED ORDER — POVIDONE-IODINE 10 % EX SWAB
2.0000 "application " | Freq: Once | CUTANEOUS | Status: AC
  Administered 2021-12-26: 2 via TOPICAL

## 2021-12-26 MED ORDER — CARVEDILOL 12.5 MG PO TABS
12.5000 mg | ORAL_TABLET | Freq: Two times a day (BID) | ORAL | Status: DC
  Administered 2021-12-26 – 2022-01-14 (×31): 12.5 mg via ORAL
  Filled 2021-12-26 (×38): qty 1

## 2021-12-26 MED ORDER — HYDROMORPHONE HCL 1 MG/ML IJ SOLN
0.2500 mg | INTRAMUSCULAR | Status: DC | PRN
  Administered 2021-12-26: 0.25 mg via INTRAVENOUS
  Administered 2021-12-26: 0.5 mg via INTRAVENOUS

## 2021-12-26 MED ORDER — PROPOFOL 10 MG/ML IV BOLUS
INTRAVENOUS | Status: DC | PRN
  Administered 2021-12-26: 30 mg via INTRAVENOUS
  Administered 2021-12-26: 100 mg via INTRAVENOUS

## 2021-12-26 MED ORDER — ACETAMINOPHEN 500 MG PO TABS
1000.0000 mg | ORAL_TABLET | Freq: Once | ORAL | Status: AC
  Administered 2021-12-26: 1000 mg via ORAL
  Filled 2021-12-26: qty 2

## 2021-12-26 MED ORDER — CEFAZOLIN SODIUM-DEXTROSE 2-4 GM/100ML-% IV SOLN: 2.0000 g | INTRAVENOUS | Status: DC

## 2021-12-26 MED ORDER — HYDROMORPHONE HCL 1 MG/ML IJ SOLN
INTRAMUSCULAR | Status: AC
  Filled 2021-12-26: qty 1

## 2021-12-26 MED ORDER — ONDANSETRON HCL 4 MG/2ML IJ SOLN
INTRAMUSCULAR | Status: AC
  Filled 2021-12-26: qty 2

## 2021-12-26 MED ORDER — INSULIN ASPART 100 UNIT/ML IJ SOLN: 0.0000 [IU] | INTRAMUSCULAR | Status: DC | PRN

## 2021-12-26 MED ORDER — FENTANYL CITRATE (PF) 250 MCG/5ML IJ SOLN
INTRAMUSCULAR | Status: AC
  Filled 2021-12-26: qty 5

## 2021-12-26 MED ORDER — MORPHINE SULFATE (PF) 2 MG/ML IV SOLN
2.0000 mg | INTRAVENOUS | Status: DC | PRN
  Administered 2021-12-26 (×2): 2 mg via INTRAVENOUS
  Filled 2021-12-26 (×2): qty 1

## 2021-12-26 MED ORDER — 0.9 % SODIUM CHLORIDE (POUR BTL) OPTIME
TOPICAL | Status: DC | PRN
  Administered 2021-12-26: 1000 mL

## 2021-12-26 MED ORDER — TRANEXAMIC ACID-NACL 1000-0.7 MG/100ML-% IV SOLN
INTRAVENOUS | Status: AC
  Filled 2021-12-26: qty 100

## 2021-12-26 MED ORDER — LIDOCAINE 2% (20 MG/ML) 5 ML SYRINGE
INTRAMUSCULAR | Status: DC | PRN
Start: 2021-12-26 — End: 2021-12-26
  Administered 2021-12-26: 100 mg via INTRAVENOUS

## 2021-12-26 MED ORDER — SODIUM CHLORIDE 0.9 % IV SOLN: INTRAVENOUS | Status: DC | PRN

## 2021-12-26 MED ORDER — MIDAZOLAM HCL 2 MG/2ML IJ SOLN
INTRAMUSCULAR | Status: AC
  Filled 2021-12-26: qty 2

## 2021-12-26 MED ORDER — RIVAROXABAN 20 MG PO TABS
20.0000 mg | ORAL_TABLET | Freq: Every day | ORAL | Status: DC
  Administered 2021-12-27 – 2021-12-28 (×2): 20 mg via ORAL
  Filled 2021-12-26 (×2): qty 1

## 2021-12-26 MED ORDER — FENTANYL CITRATE (PF) 250 MCG/5ML IJ SOLN
INTRAMUSCULAR | Status: DC | PRN
  Administered 2021-12-26 (×2): 25 ug via INTRAVENOUS
  Administered 2021-12-26: 100 ug via INTRAVENOUS

## 2021-12-26 MED ORDER — POVIDONE-IODINE 10 % EX SWAB: 2.0000 "application " | Freq: Once | CUTANEOUS | Status: DC

## 2021-12-26 MED ORDER — CHLORHEXIDINE GLUCONATE 4 % EX LIQD: 60.0000 mL | Freq: Once | CUTANEOUS | Status: DC

## 2021-12-26 MED ORDER — ONDANSETRON HCL 4 MG/2ML IJ SOLN
INTRAMUSCULAR | Status: DC | PRN
  Administered 2021-12-26: 4 mg via INTRAVENOUS

## 2021-12-26 MED ORDER — TRANEXAMIC ACID-NACL 1000-0.7 MG/100ML-% IV SOLN
1000.0000 mg | INTRAVENOUS | Status: AC
  Administered 2021-12-26: 1000 mg via INTRAVENOUS

## 2021-12-26 MED ORDER — MIDAZOLAM HCL 2 MG/2ML IJ SOLN
INTRAMUSCULAR | Status: DC | PRN
  Administered 2021-12-26: .5 mg via INTRAVENOUS

## 2021-12-26 MED ORDER — DROPERIDOL 2.5 MG/ML IJ SOLN: 0.6250 mg | Freq: Once | INTRAMUSCULAR | Status: DC | PRN

## 2021-12-26 SURGICAL SUPPLY — 46 items
BAG COUNTER SPONGE SURGICOUNT (BAG) IMPLANT
BAG SPNG CNTER NS LX DISP (BAG)
BLADE SAW RECIP 87.9 MT (BLADE) ×2 IMPLANT
BLADE SURG 21 STRL SS (BLADE) ×2 IMPLANT
BNDG COHESIVE 4X5 TAN ST LF (GAUZE/BANDAGES/DRESSINGS) ×2 IMPLANT
BNDG COHESIVE 6X5 TAN STRL LF (GAUZE/BANDAGES/DRESSINGS) ×2 IMPLANT
CANISTER WOUND CARE 500ML ATS (WOUND CARE) IMPLANT
CANISTER WOUNDNEG PRESSURE 500 (CANNISTER) ×1 IMPLANT
COVER SURGICAL LIGHT HANDLE (MISCELLANEOUS) ×2 IMPLANT
CUFF TOURN SGL QUICK 34 (TOURNIQUET CUFF)
CUFF TRNQT CYL 34X4.125X (TOURNIQUET CUFF) IMPLANT
DRAPE DERMATAC (DRAPES) ×2 IMPLANT
DRAPE INCISE IOBAN 66X45 STRL (DRAPES) ×4 IMPLANT
DRAPE U-SHAPE 47X51 STRL (DRAPES) ×2 IMPLANT
DRESSING PREVENA PLUS CUSTOM (GAUZE/BANDAGES/DRESSINGS) ×1 IMPLANT
DRSG PREVENA PLUS CUSTOM (GAUZE/BANDAGES/DRESSINGS) ×2
DURAPREP 26ML APPLICATOR (WOUND CARE) ×2 IMPLANT
ELECT REM PT RETURN 9FT ADLT (ELECTROSURGICAL) ×2
ELECTRODE REM PT RTRN 9FT ADLT (ELECTROSURGICAL) ×1 IMPLANT
GLOVE BIOGEL PI IND STRL 7.5 (GLOVE) ×1 IMPLANT
GLOVE BIOGEL PI IND STRL 9 (GLOVE) ×1 IMPLANT
GLOVE BIOGEL PI INDICATOR 7.5 (GLOVE) ×1
GLOVE BIOGEL PI INDICATOR 9 (GLOVE) ×1
GLOVE SURG ORTHO 9.0 STRL STRW (GLOVE) ×2 IMPLANT
GLOVE SURG SS PI 6.5 STRL IVOR (GLOVE) ×2 IMPLANT
GOWN STRL REUS W/ TWL LRG LVL3 (GOWN DISPOSABLE) ×1 IMPLANT
GOWN STRL REUS W/ TWL XL LVL3 (GOWN DISPOSABLE) ×2 IMPLANT
GOWN STRL REUS W/TWL LRG LVL3 (GOWN DISPOSABLE) ×2
GOWN STRL REUS W/TWL XL LVL3 (GOWN DISPOSABLE) ×4
GRAFT SKIN WND MICRO 38 (Tissue) ×1 IMPLANT
GRAFT SKIN WND OMEGA3 SB 7X10 (Tissue) ×1 IMPLANT
KIT BASIN OR (CUSTOM PROCEDURE TRAY) ×2 IMPLANT
KIT TURNOVER KIT B (KITS) ×2 IMPLANT
MANIFOLD NEPTUNE II (INSTRUMENTS) ×2 IMPLANT
NS IRRIG 1000ML POUR BTL (IV SOLUTION) ×2 IMPLANT
PACK ORTHO EXTREMITY (CUSTOM PROCEDURE TRAY) ×2 IMPLANT
PAD ARMBOARD 7.5X6 YLW CONV (MISCELLANEOUS) ×2 IMPLANT
PREVENA RESTOR ARTHOFORM 46X30 (CANNISTER) ×2 IMPLANT
STAPLER VISISTAT 35W (STAPLE) IMPLANT
STOCKINETTE IMPERVIOUS LG (DRAPES) IMPLANT
SUT ETHILON 2 0 PSLX (SUTURE) ×6 IMPLANT
SUT SILK 2 0 (SUTURE) ×2
SUT SILK 2-0 18XBRD TIE 12 (SUTURE) ×1 IMPLANT
TOWEL GREEN STERILE FF (TOWEL DISPOSABLE) ×2 IMPLANT
TUBE CONNECTING 20X1/4 (TUBING) ×2 IMPLANT
YANKAUER SUCT BULB TIP NO VENT (SUCTIONS) ×2 IMPLANT

## 2021-12-26 NOTE — Progress Notes (Signed)
?PROGRESS NOTE ?Shannon [name]  EUM:[id number] DOB: 08-10-1956 DOA: 12/17/2021 ?PCP: [name], MD  ? ?Brief Narrative/Hospital Course: ?66 year old female with a history of metastatic adenocarcinoma of lung, COPD, HIV, was admitted to the hospital with right lower extremity wound, necrotizing fascitis,seen by orthopedics and underwent incision and drainage.  He was noted to have fungemia.  Infectious disease following for antibiotics/antifungals.  Overall, her condition worsened with worsening mental status and respiratory status.  She required intubation and transfer to ICU.  She was treated for COPD exacerbation and since then, respiratory status has stabilized and she was extubated on 5/1.  Orthopedics following for gas gangrene/necrotizing fasciitis of her right lower extremity and plans are for amputation.She will be discharged to skilled nursing facility when she is medically stabilized.  She was transferred to [facility] service on 5/2 ? ?4/26: admitted, I&D of LLE ?4/28: started on bipap, altered mentation ?4/29: I&D, transferred to ICU and intubated ?Off Vent 5/1 and to [place] 5/2 ? ?Subjective: ?Seen and examined this morning no new complaints.  Resting comfortably was off oxygen= spo2 at 93% ?Overnight no fever, BP up to 204 last night\labs stable ?  ?Assessment and Plan: ?Principal Problem: ?  Abscess of right foot ?Active Problems: ?  HIV disease ([place]) ?  Uncontrolled hypertension ?  Morbid (severe) obesity due to excess calories ([place]) ?  Atrial flutter ([place]) ?  Chronic respiratory failure with hypoxia (HCC) ?  Chronic anticoagulation ?  (HFpEF) heart failure with preserved ejection fraction ([place]) ?  Adenocarcinoma of lung ([place]) ?  Abscess of foot ?  Necrotizing fasciitis ([place]) ?  Pressure injury of skin ?  Fungemia ?  Acute respiratory failure with hypercapnia (HCC) ?  Macrocytic anemia ?  ?Acute on chronic respiratory failure with hypoxia and hypercapnia: ?Acute COPD exacerbation: ?Initially needing  intubation due to AMS on BiPAP.  Off vent 5/1, now on 2l [place], BiPAP bedtime and intermittently daytime. Resp status stable, cont on prednisone taper,Pulmicort/Brovana, nebulizer, supplemental oxygen and wean as tolerated.PCCM signed off.Encourage PT OT, IS. ? ?Necrotizing fasciitis right lower extremity status post I&D ?Sepsis due to Candididemia and Klebsiella soft tissue infection of right lower extremity right upper extremity: ?S/P I&D 4/29- OR culture positive with Klebsiella, Proteus and Enterococcus. Dr Sharol Given following recommending AKA- he is going to talk with patient/family  today-and remains n.p.o. for surgery and off therapeutic Lovenox. Continue Unasyn (based on sensitivity on abovethree organisms),Diflucan- candidemia likely due to wound infection translocation, once stable will need TEE to rule out endocarditis and ophthalmology to rule out endophthalmitis as per ID. ?  ?Metastatic adenocarcinoma of the lung to bone: Follow-up with oncology outpatient. ? ?Acute metabolic encephalopathy: Multifactorial and also due to hypercapnea.This has resolved. Cont  Seroquel at bedtime. Continue PT/OT/Supportive care. ? ?AKI on CKD stage IIIa: Renal function is stable 1.3- 1.1 Monitor ?Recent Labs  ?Lab 12/20/21 ?0458 12/21/21 ?5400 12/23/21 ?1114 12/24/21 ?8676 12/26/21 ?0603  ?BUN 29* 27* 37* 45* 41*  ?CREATININE 1.46* 1.45* 1.42* 1.31* 1.11*  ? ?Chronic diastolic CHF ?Essential hypertension: ?Currently compensated chf. BP still Uncontrolled,Cardizem INCREASED 60> 90 gm q6h 12/25/21, on aldactone.  Will change metoprolol to Coreg and titrate  as needed ? ?Atrial flutter:At times SVT in 150s.heart rate controlled continue Cardizem/Coreg as above. Cont anticoagulation with Lovenox ( hold preop) > transition to Xarelto once surgery completed. ? ?HIV disease: Continue home regimen, ID following ? ?Macrocytic anemia: Multifactorial -also with anemia of chronic disease.  Stable-high B12 level, normal folic acid.  Monitor ?Recent Labs  ?Lab 12/22/21 ?0424 12/23/21 ?0408 12/24/21 ?6606 12/25/21 ?3016 12/26/21 ?0109  ?HGB 9.1* 8.3* 8.7* 8.6* 9.0*  ?HCT 28.8* 26.5* 27.4* 28.3* 28.5*  ? ?Type 2 diabetes mellitus: Borderline HbA1c 7.3.  Blood sugar borderline controlled, continue SSI. ?Recent Labs  ?Lab 12/24/21 ?1642 12/24/21 ?2140 12/25/21 ?3235 12/25/21 ?1138 12/25/21 ?1607  ?GLUCAP 231* 178* 120* 151* 209*  ? ?Morbid obesity:Patient's Body mass index is 41.74 kg/m?. : Will benefit with PCP follow-up, weight loss  healthy lifestyle and outpatient sleep evaluation-with pulmonary. ? ?Deconditioning/debility continue PT OT will need placement postsurgery ? ?Pressure ulcers stage I right buttock POA- see below ?Pressure Injury 12/20/21 Buttocks Right Stage 1 -  Intact skin with non-blanchable redness of a localized area usually over a bony prominence. (Active)  ?12/20/21 1600  ?Location: Buttocks  ?Location Orientation: Right  ?Staging: Stage 1 -  Intact skin with non-blanchable redness of a localized area usually over a bony prominence.  ?Wound Description (Comments):   ?Present on Admission: Yes  ?Dressing Type Foam - Lift dressing to assess site every shift 12/23/21 0800  ? ?DVT prophylaxis: SCD's Start: 12/20/21 1327 ?Code Status:   Code Status: Full Code ?Family Communication: plan of care discussed with patient at bedside. ?Patient status is: inpatient  Level of care: Stepdown  ?Remains inpatient because: Ongoing management of patient's leg infection and fungemia ?Patient currently not stable ? ?Dispo: The patient is from: Home ?           Anticipated disposition: Skilled nursing facility once surgery completed ? ?Mobility Assessment (last 72 hours)   ? ? Mobility Assessment   ? ? [place] Name 12/25/21 2000 12/24/21 2000 12/23/21 1200 12/23/21 1013  ?  ? Does patient have an order for bedrest or is patient medically unstable Yes- Bedfast (Level 1) - Complete Yes- Bedfast (Level 1) - Complete -- --   ? What is the highest level of  mobility based on the progressive mobility assessment? Level 1 (Bedfast) - Unable to balance while sitting on edge of bed -- Level 2 (Chairfast) - Balance while sitting on edge of bed and cannot stand Level 2 (Chairfast) - Balance while sitting on edge of bed and cannot stand   ? Is the above level different from baseline mobility prior to current illness? Yes - Recommend PT order -- -- --   ? ?  ?  ? ?  ?  ? ?Objective: ?Vitals last 24 hrs: ?Vitals:  ? 12/26/21 0200 12/26/21 0300 12/26/21 0400 12/26/21 0500  ?BP: (!) 158/76 (!) 168/79 (!) 167/68 (!) 163/75  ?Pulse: 66 66 68 66  ?Resp: (!) 23 20 (!) 24 (!) 21  ?Temp:   97.9 ?F (36.6 ?C)   ?TempSrc:   Oral   ?SpO2: 94% 96% 96% 95%  ?Weight:      ?Height:      ? ?Weight change:  ? ?Physical Examination: ? ?General exam: AA oriented, pleasant, morbidly obese,older than stated age, weak appearing. ?HEENT:Oral mucosa moist, Ear/Nose WNL grossly, dentition normal. ?Respiratory system: bilaterally diminished,no use of accessory muscle ?Cardiovascular system: S1 & S2 +, No JVD,. ?Gastrointestinal system: Abdomen soft,NT,ND, BS+ ?Nervous System:Alert, awake, moving extremities and grossly nonfocal ?Extremities: edema neg,distal peripheral pulses palpable.  ?Skin: No rashes,no icterus. ?MSK: Normal muscle bulk,tone, power. RLE with dressing in place. ? ?Medications reviewed:  ?Scheduled Meds: ? (feeding supplement) PROSource Plus  30 mL Oral BID BM  ? arformoterol  15 mcg Nebulization BID  ? vitamin C  500 mg Oral BID  ? bictegravir-emtricitabine-tenofovir AF  1 tablet Oral Daily  ? budesonide (PULMICORT) nebulizer solution  0.5 mg Nebulization BID  ? chlorhexidine  15 mL Mouth Rinse BID  ? Chlorhexidine Gluconate Cloth  6 each Topical Daily  ? diltiazem  90 mg Oral Q6H  ? famotidine  20 mg Oral Daily  ? feeding supplement  237 mL Oral BID BM  ? insulin aspart  0-15 Units Subcutaneous TID WC  ? insulin aspart  0-5 Units Subcutaneous QHS  ? mouth rinse  15 mL Mouth Rinse  [id number]  ? metoprolol tartrate  50 mg Oral BID  ? multivitamin with minerals  1 tablet Oral Daily  ? polyethylene glycol  17 g Oral Daily  ? predniSONE  30 mg Oral Q breakfast  ? Followed by  ? [START ON 12/28/2021] predniS

## 2021-12-26 NOTE — Interval H&P Note (Signed)
History and Physical Interval Note: ? ?12/26/2021 ?6:52 AM ? ?Shannon [name]  has presented today for surgery, with the diagnosis of Necrotizing Fascitis Right Leg.  The various methods of treatment have been discussed with the patient and family. After consideration of risks, benefits and other options for treatment, the patient has consented to  Procedure(s): ?AMPUTATION ABOVE KNEE (Right) as a surgical intervention.  The patient's history has been reviewed, patient examined, no change in status, stable for surgery.  I have reviewed the patient's chart and labs.  Questions were answered to the patient's satisfaction.   ? ? ?Newt Minion ? ? ?

## 2021-12-26 NOTE — Progress Notes (Signed)
This nurse spoke with Dr. Hal Hope referencing the patient's continued elevated BP. The patient has already been given pain medication and PRN BP maintenance medication without relief. Dr. Raliegh Ip gave a verbal order to give another dose of hydralazine sooner than the PRN Q6hr.  ?

## 2021-12-26 NOTE — Progress Notes (Signed)
This nurse called Ortho-surg. on call provider this evening, Basil Dess, MD, referencing the patient's current PO intake. There are no orders whether this patient may begin a diet/po of medication/fluids. Per Dr. Louanne Skye to start a diet as tolerated up to carb. modified diet. This nurse will place the order.  ?

## 2021-12-26 NOTE — Progress Notes (Signed)
Patient swallowed water/PO medications well. Patient was irritable and refused ascorbic acid medication this evening. Patient wanted to discontinue the BiPAP but O2 sats in a matter of a few minutes were in the 70's. Patient was educated and BiPAP was placed back on the patient.  ?

## 2021-12-26 NOTE — Transfer of Care (Signed)
Immediate Anesthesia Transfer of Care Note ? ?Patient: KEYUNA CUTHRELL ? ?Procedure(s) Performed: AMPUTATION ABOVE KNEE (Right: Knee) ? ?Patient Location: PACU ? ?Anesthesia Type:General ? ?Level of Consciousness: drowsy, patient cooperative and responds to stimulation ? ?Airway & Oxygen Therapy: Patient Spontanous Breathing and Patient connected to face mask oxygen ? ?Post-op Assessment: Report given to RN and Post -op Vital signs reviewed and stable ? ?Post vital signs: Reviewed and stable ? ?Last Vitals:  ?Vitals Value Taken Time  ?BP 159/81 12/26/21 1552  ?Temp    ?Pulse 72 12/26/21 1552  ?Resp 16 12/26/21 1552  ?SpO2 92 % 12/26/21 1552  ?Vitals shown include unvalidated device data. ? ?Last Pain:  ?Vitals:  ? 12/26/21 1237  ?TempSrc:   ?PainSc: 5   ?   ? ?Patients Stated Pain Goal: 3 (12/26/21 1200) ? ?Complications: No notable events documented. ?

## 2021-12-26 NOTE — Anesthesia Postprocedure Evaluation (Signed)
Anesthesia Post Note ? ?Patient: Shannon [name] ? ?Procedure(s) Performed: AMPUTATION ABOVE KNEE (Right: Knee) ? ?  ? ?Patient location during evaluation: PACU ?Anesthesia Type: General ?Level of consciousness: awake and alert ?Pain management: pain level controlled ?Vital Signs Assessment: post-procedure vital signs reviewed and stable ?Respiratory status: spontaneous breathing, nonlabored ventilation and respiratory function stable ?Cardiovascular status: blood pressure returned to baseline ?Postop Assessment: no apparent nausea or vomiting ?Anesthetic complications: no ? ? ?No notable events documented. ? ?Last Vitals:  ?Vitals:  ? 12/26/21 1622 12/26/21 1637  ?BP: (!) 185/83 (!) 199/88  ?Pulse: 73 65  ?Resp: (!) 27 14  ?Temp:    ?SpO2: 100% 100%  ?  ?  ?  ?  ?  ?  ? ?Marthenia Rolling ? ? ? ? ?

## 2021-12-26 NOTE — Anesthesia Preprocedure Evaluation (Addendum)
Anesthesia Evaluation  ?Patient identified by MRN, date of birth, ID band ?Patient awake ? ? ? ?Reviewed: ?Allergy & Precautions, NPO status , Patient's Chart, lab work & pertinent test results ? ?History of Anesthesia Complications ?Negative for: history of anesthetic complications ? ?Airway ?Mallampati: II ? ?TM Distance: >3 FB ?Neck ROM: Full ? ? ? Dental ? ?(+) Missing, Edentulous Upper,  ?  ?Pulmonary ?asthma , COPD,  COPD inhaler, former smoker, PE (2016) ?  ?Pulmonary exam normal ? ? ? ? ? ? ? Cardiovascular ?hypertension, Pt. on medications ?+ CAD and +CHF  ?Normal cardiovascular exam+ dysrhythmias (on Xarelto) Atrial Fibrillation  ? ? ?  ?Neuro/Psych ?Anxiety negative neurological ROS ?   ? GI/Hepatic ?Neg liver ROS, GERD  ,  ?Endo/Other  ?Morbid obesity ? Renal/GU ?Renal InsufficiencyRenal disease  ?negative genitourinary ?  ?Musculoskeletal ? ?(+) Arthritis ,  ? Abdominal ?  ?Peds ? Hematology ? ?(+) Blood dyscrasia (Hgb 9.0, Plt 121k), anemia , HIV,   ?Anesthesia Other Findings ?Necrotizing Fasciitis Right Leg ? Reproductive/Obstetrics ?negative OB ROS ? ?  ? ? ? ? ? ? ? ? ? ? ? ? ? ?  ?  ? ? ? ? ? ? ? ?Anesthesia Physical ?Anesthesia Plan ? ?ASA: 3 ? ?Anesthesia Plan: General  ? ?Post-op Pain Management: Tylenol PO (pre-op)*  ? ?Induction: Intravenous ? ?PONV Risk Score and Plan: 3 and Treatment may vary due to age or medical condition, Midazolam, Dexamethasone and Ondansetron ? ?Airway Management Planned: LMA ? ?Additional Equipment: None ? ?Intra-op Plan:  ? ?Post-operative Plan: Extubation in OR ? ?Informed Consent: I have reviewed the patients History and Physical, chart, labs and discussed the procedure including the risks, benefits and alternatives for the proposed anesthesia with the patient or authorized representative who has indicated his/her understanding and acceptance.  ? ? ? ?Dental advisory given ? ?Plan Discussed with: CRNA ? ?Anesthesia Plan Comments:    ? ? ? ? ? ?Anesthesia Quick Evaluation ? ?

## 2021-12-26 NOTE — Op Note (Signed)
12/26/2021 ? ?4:41 PM ? ?PATIENT:  Chantilly Linskey Minteer   ? ?PRE-OPERATIVE DIAGNOSIS:  Necrotizing Fascitis Right Leg ? ?POST-OPERATIVE DIAGNOSIS:  Same ? ?PROCEDURE:  AMPUTATION ABOVE KNEE ?Application of Kerecis powder 38 cm? and application of Kerecis sheet 70 cm?.  A total of 108 cm?[name] ?Application of Prevena customizable and Arnell Sieving form wound VAC. ? ?SURGEON:  Newt Minion, MD ? ?PHYSICIAN ASSISTANT:None ?ANESTHESIA:   General ? ?PREOPERATIVE INDICATIONS:  LEYLANI DULEY is a  66 y.o. female with a diagnosis of Necrotizing Fascitis Right Leg who failed conservative measures and elected for surgical management.   ? ?The risks benefits and alternatives were discussed with the patient preoperatively including but not limited to the risks of infection, bleeding, nerve injury, cardiopulmonary complications, the need for revision surgery, among others, and the patient was willing to proceed. ? ?OPERATIVE IMPLANTS: Kerecis micro 38 cm?, Kerecis she 70 cm?. ? ?@ENCIMAGES @ ? ?OPERATIVE FINDINGS: Good petechial bleeding extensive calcified arteries. ? ?OPERATIVE PROCEDURE: Patient brought the operating room and underwent a general anesthetic.  After adequate levels anesthesia were obtained patient's right lower extremity was prepped using DuraPrep draped into a sterile field the gangrenous right lower extremity was draped out of the sterile field with impervious stockinette.  A fishmouth incision was made just proximal to the patella.  This was carried sharply down to bone.  The vascular bundles medially were clamped and suture-ligated with 2-0 silk.  The remainder of the amputation was completed the bone was cut with a reciprocating saw.  The wound was irrigated normal saline hemostasis was obtained.  38 cm? of micro powder were applied this was covered with the 7 x 10 sheet.  The deep and superficial fascial layers and skin were closed over the biologic implant with 2-0 nylon.  The wound.  Closed well without tension.  The  skin was closed using staples.  The [facility] customizable was applied this was then covered with the Ortho form.  This was secured with derma tack this had a good suction fit patient was extubated taken the PACU in stable condition. ? ? ?DISCHARGE PLANNING: ? ?Antibiotic duration: Continue antibiotics for 24 hours tissue margins were clear ? ?Weightbearing: Nonweightbearing on the right ? ?Pain medication: Opioid pathway ? ?Dressing care/ Wound VAC: Continue wound VAC at discharge ? ?Ambulatory devices: Walker ? ?Discharge to: Anticipate discharge to skilled nursing. ? ?Follow-up: In the office 1 week post operative. ? ? ? ? ? ? ?  ?

## 2021-12-26 NOTE — Interval H&P Note (Signed)
History and Physical Interval Note: ? ?12/26/2021 ?2:00 PM ? ?Shannon [name]  has presented today for surgery, with the diagnosis of Necrotizing Fascitis Right Leg.  The various methods of treatment have been discussed with the patient and family. After consideration of risks, benefits and other options for treatment, the patient has consented to  Procedure(s): ?AMPUTATION ABOVE KNEE (Right) as a surgical intervention.  The patient's history has been reviewed, patient examined, no change in status, stable for surgery.  I have reviewed the patient's chart and labs.  Questions were answered to the patient's satisfaction.   ? ? ?Newt Minion ? ? ?

## 2021-12-26 NOTE — Progress Notes (Signed)
PT Cancellation Note ? ?Patient Details ?Name: Shannon [name] ?MRN: [id number] ?DOB: 16-Jan-1956 ? ? ?Cancelled Treatment:     ?Will hold PT today due to R AKA surgery.  ?  ? ? ?Marisa Hage ?12/26/2021, 12:27 PM ?

## 2021-12-26 NOTE — Progress Notes (Signed)
Dilaudid 0.5mg  wasted with Francine Graven RN ?

## 2021-12-26 NOTE — Anesthesia Procedure Notes (Signed)
Procedure Name: LMA Insertion ?Date/Time: 12/26/2021 3:13 PM ?Performed by: Cathren Harsh, CRNA ?Pre-anesthesia Checklist: Patient identified, Emergency Drugs available, Suction available and Patient being monitored ?Patient Re-evaluated:Patient Re-evaluated prior to induction ?Oxygen Delivery Method: Circle System Utilized ?Preoxygenation: Pre-oxygenation with 100% oxygen ?Induction Type: IV induction ?Ventilation: Mask ventilation without difficulty ?LMA: LMA inserted ?LMA Size: 4.0 ?Number of attempts: 1 ?Airway Equipment and Method: Bite block ?Placement Confirmation: positive ETCO2 ?Tube secured with: Tape ?Dental Injury: Teeth and Oropharynx as per pre-operative assessment  ? ? ? ? ?

## 2021-12-27 ENCOUNTER — Other Ambulatory Visit (HOSPITAL_BASED_OUTPATIENT_CLINIC_OR_DEPARTMENT_OTHER): Payer: Self-pay | Admitting: Cardiovascular Disease

## 2021-12-27 DIAGNOSIS — I483 Typical atrial flutter: Secondary | ICD-10-CM

## 2021-12-27 DIAGNOSIS — L02619 Cutaneous abscess of unspecified foot: Secondary | ICD-10-CM | POA: Diagnosis not present

## 2021-12-27 DIAGNOSIS — L89101 Pressure ulcer of unspecified part of back, stage 1: Secondary | ICD-10-CM

## 2021-12-27 DIAGNOSIS — J9611 Chronic respiratory failure with hypoxia: Secondary | ICD-10-CM

## 2021-12-27 DIAGNOSIS — L02611 Cutaneous abscess of right foot: Secondary | ICD-10-CM | POA: Diagnosis not present

## 2021-12-27 DIAGNOSIS — I5033 Acute on chronic diastolic (congestive) heart failure: Secondary | ICD-10-CM | POA: Diagnosis not present

## 2021-12-27 DIAGNOSIS — Z7901 Long term (current) use of anticoagulants: Secondary | ICD-10-CM

## 2021-12-27 DIAGNOSIS — I10 Essential (primary) hypertension: Secondary | ICD-10-CM

## 2021-12-27 DIAGNOSIS — J9602 Acute respiratory failure with hypercapnia: Secondary | ICD-10-CM | POA: Diagnosis not present

## 2021-12-27 DIAGNOSIS — C3492 Malignant neoplasm of unspecified part of left bronchus or lung: Secondary | ICD-10-CM

## 2021-12-27 DIAGNOSIS — D539 Nutritional anemia, unspecified: Secondary | ICD-10-CM

## 2021-12-27 LAB — GLUCOSE, CAPILLARY
Glucose-Capillary: 104 mg/dL — ABNORMAL HIGH (ref 70–99)
Glucose-Capillary: 173 mg/dL — ABNORMAL HIGH (ref 70–99)
Glucose-Capillary: 224 mg/dL — ABNORMAL HIGH (ref 70–99)
Glucose-Capillary: 196 mg/dL — ABNORMAL HIGH (ref 70–99)

## 2021-12-27 LAB — CULTURE, BLOOD (ROUTINE X 2)
Culture: NO GROWTH
Culture: NO GROWTH
Special Requests: ADEQUATE
Special Requests: ADEQUATE

## 2021-12-27 MED ORDER — ONDANSETRON HCL 4 MG/2ML IJ SOLN: 4.0000 mg | Freq: Four times a day (QID) | INTRAMUSCULAR | Status: DC | PRN

## 2021-12-27 MED ORDER — JUVEN PO PACK
1.0000 | PACK | Freq: Two times a day (BID) | ORAL | Status: DC
  Administered 2021-12-27 – 2022-01-13 (×29): 1 via ORAL
  Filled 2021-12-27 (×26): qty 1

## 2021-12-27 MED ORDER — HYDRALAZINE HCL 25 MG PO TABS
25.0000 mg | ORAL_TABLET | Freq: Three times a day (TID) | ORAL | Status: DC
  Administered 2021-12-27 – 2022-01-14 (×45): 25 mg via ORAL
  Filled 2021-12-27 (×50): qty 1

## 2021-12-27 MED ORDER — PHENOL 1.4 % MT LIQD: 1.0000 | OROMUCOSAL | Status: DC | PRN

## 2021-12-27 MED ORDER — HYDRALAZINE HCL 20 MG/ML IJ SOLN: 5.0000 mg | INTRAMUSCULAR | Status: DC | PRN

## 2021-12-27 MED ORDER — BISACODYL 5 MG PO TBEC: 5.0000 mg | DELAYED_RELEASE_TABLET | Freq: Every day | ORAL | Status: DC | PRN

## 2021-12-27 MED ORDER — HYDROMORPHONE HCL 2 MG PO TABS
2.0000 mg | ORAL_TABLET | ORAL | Status: DC | PRN
  Administered 2021-12-27 – 2021-12-29 (×8): 2 mg via ORAL
  Filled 2021-12-27 (×8): qty 1

## 2021-12-27 MED ORDER — OXYCODONE HCL 5 MG PO TABS
5.0000 mg | ORAL_TABLET | ORAL | Status: DC | PRN
  Administered 2021-12-27 – 2021-12-31 (×6): 10 mg via ORAL
  Filled 2021-12-27 (×6): qty 2

## 2021-12-27 MED ORDER — ALUM & MAG HYDROXIDE-SIMETH 200-200-20 MG/5ML PO SUSP
15.0000 mL | ORAL | Status: DC | PRN
  Filled 2021-12-27: qty 30

## 2021-12-27 MED ORDER — ASCORBIC ACID 500 MG PO TABS
1000.0000 mg | ORAL_TABLET | Freq: Every day | ORAL | Status: DC
  Administered 2021-12-27 – 2022-01-14 (×19): 1000 mg via ORAL
  Filled 2021-12-27 (×19): qty 2

## 2021-12-27 MED ORDER — POTASSIUM CHLORIDE CRYS ER 20 MEQ PO TBCR: 20.0000 meq | EXTENDED_RELEASE_TABLET | Freq: Every day | ORAL | Status: DC | PRN

## 2021-12-27 MED ORDER — PANTOPRAZOLE SODIUM 40 MG PO TBEC
40.0000 mg | DELAYED_RELEASE_TABLET | Freq: Every day | ORAL | Status: DC
  Administered 2021-12-27 – 2022-01-14 (×19): 40 mg via ORAL
  Filled 2021-12-27 (×19): qty 1

## 2021-12-27 MED ORDER — SODIUM CHLORIDE 0.9 % IV SOLN: INTRAVENOUS | Status: DC

## 2021-12-27 MED ORDER — MAGNESIUM CITRATE PO SOLN
1.0000 | Freq: Once | ORAL | Status: DC | PRN
  Filled 2021-12-27: qty 296

## 2021-12-27 MED ORDER — CEFAZOLIN SODIUM-DEXTROSE 2-4 GM/100ML-% IV SOLN: 2.0000 g | Freq: Three times a day (TID) | INTRAVENOUS | Status: DC

## 2021-12-27 MED ORDER — HYDROMORPHONE HCL 1 MG/ML IJ SOLN
0.5000 mg | INTRAMUSCULAR | Status: DC | PRN
  Administered 2021-12-30 – 2022-01-02 (×3): 1 mg via INTRAVENOUS
  Filled 2021-12-27 (×3): qty 1

## 2021-12-27 MED ORDER — ZINC SULFATE 220 (50 ZN) MG PO CAPS
220.0000 mg | ORAL_CAPSULE | Freq: Every day | ORAL | Status: AC
  Administered 2021-12-27 – 2022-01-09 (×14): 220 mg via ORAL
  Filled 2021-12-27 (×14): qty 1

## 2021-12-27 MED ORDER — HYDRALAZINE HCL 20 MG/ML IJ SOLN
10.0000 mg | Freq: Once | INTRAMUSCULAR | Status: AC
Start: 2021-12-27 — End: 2021-12-27
  Administered 2021-12-27: 10 mg via INTRAVENOUS
  Filled 2021-12-27: qty 1

## 2021-12-27 MED ORDER — DOCUSATE SODIUM 100 MG PO CAPS
100.0000 mg | ORAL_CAPSULE | Freq: Every day | ORAL | Status: DC
  Administered 2021-12-27 – 2022-01-14 (×16): 100 mg via ORAL
  Filled 2021-12-27 (×17): qty 1

## 2021-12-27 MED ORDER — METOPROLOL TARTRATE 5 MG/5ML IV SOLN: 2.0000 mg | INTRAVENOUS | Status: DC | PRN

## 2021-12-27 MED ORDER — ACETAMINOPHEN 325 MG PO TABS
325.0000 mg | ORAL_TABLET | Freq: Four times a day (QID) | ORAL | Status: DC | PRN
  Administered 2022-01-11 – 2022-01-13 (×2): 650 mg via ORAL
  Filled 2021-12-27 (×2): qty 2

## 2021-12-27 MED ORDER — GUAIFENESIN-DM 100-10 MG/5ML PO SYRP: 15.0000 mL | ORAL_SOLUTION | ORAL | Status: DC | PRN

## 2021-12-27 MED ORDER — MAGNESIUM SULFATE 2 GM/50ML IV SOLN: 2.0000 g | Freq: Every day | INTRAVENOUS | Status: DC | PRN

## 2021-12-27 MED ORDER — POLYETHYLENE GLYCOL 3350 17 G PO PACK: 17.0000 g | PACK | Freq: Every day | ORAL | Status: DC | PRN

## 2021-12-27 MED ORDER — LABETALOL HCL 5 MG/ML IV SOLN
10.0000 mg | INTRAVENOUS | Status: DC | PRN
  Filled 2021-12-27: qty 4

## 2021-12-27 NOTE — Progress Notes (Addendum)
?PROGRESS NOTE ? ? ? ?Shannon [name]  ?FTD:[id number]  ?DOB: 1955/12/09  ?PCP: [name], MD ?Admit date:12/17/2021 ?Chief compliant: worsening RLE wound ?66 y.o. female with medical history significant of metastatic recurrent non-small cell lung cancer (01/2020) s/p curative radiotherapy (03/2020), with recurrence in February 2023, A fib on Xarelto, asthma, anxiety, COPD, GERD, hypertension, hyperlipidemia and HIV was brought in to the emergency department from [facility] due to worsening of the right lower extremity wound. ?ED Course: Afebrile, slightly tachycardic with elevated BP (172/104).  Wbc 11.5.  Her CKD stage IIIa was at baseline.  She was diagnosed with right foot abscess and possible necrotizing fasciitis based on the CT of the lower extremity.  ED physician had consulted with orthopedics who recommended admission under hospitalist service and they will plan surgical intervention. ?Hospital course: Patient admitted to [facility] for Abscess and necrotizing fasciitis of the right lower extremity: Patient was seen by orthopedics Dr. Kathaleen Bury in the ED ->  ordered MRI bilateral lower extremities   Patient admitted with Merrem, vancomycin and oral clindamycin as IV clindamycin backordered.  Patient has allergy to penicillins.  Dilaudid for pain.  She had mild lactic acidosis as well as AKI on CKD (Cr 1.1->1.86) ->IV fluids. Patient underwent debridement of the foot and calf on 12/17/2021, Ortho felt patient did not have adequate soft tissue for BKA. He was noted to have fungemia during the [facility].   Infectious disease following for antibiotics/antifungals.  Her condition worsened with worsening mental status and respiratory status.  She required intubation and transfer to ICU.  She was treated for COPD exacerbation and was extubated on 5/1. Tx to [facility] on 5/2. , ultimately underwent AKA at [facility] on 12/26/21.  ? ?Subjective: ?Patient is POD #1.  Appears awake alert and oriented.  Communicating well.  States pain  controlled with current pain medications.  Per bedside nurse mostly requiring p.o. meds with occasional IV Dilaudid for breakthrough pain.  BP fluctuating between systolic 062B to 762G.  Tmax 100.7 F ? ?Objective: ?Vitals:  ? 12/27/21 0301 12/27/21 0400 12/27/21 0417 12/27/21 0418  ?BP:    (!) 193/60  ?Pulse: 68     ?Resp: 17     ?Temp:  99.1 ?F (37.3 ?C)    ?TempSrc:  Axillary    ?SpO2: 100%     ?Weight:   101.1 kg   ?Height:      ? ? ?Intake/Output Summary (Last 24 hours) at 12/27/2021 0735 ?Last data filed at 12/27/2021 0427 ?Gross per 24 hour  ?Intake 1302.9 ml  ?Output 1900 ml  ?Net -597.1 ml  ? ?Filed Weights  ? 12/24/21 0500 12/25/21 0500 12/27/21 0417  ?Weight: 108 kg 110.3 kg 101.1 kg  ? ? ?Physical Examination: ? ?General: Obese female, no acute distress noted ?Head ENT: Atraumatic normocephalic, PERRLA, neck supple ?Heart: S1-S2 heard, regular rate and rhythm, no murmurs.  No leg edema noted ?Lungs: Equal air entry bilaterally, no rhonchi or rales on exam, no accessory muscle use ?Abdomen: Bowel sounds heard, soft, nontender, nondistended. No organomegaly.  No CVA tenderness ?Extremities: 1+ pitting pedal edema along left lower extremity.  S/p RT AKA with wound VAC in place.  Right upper extremity covered in dressing ?Neurological: Awake alert oriented x3, no focal weakness or numbness, strength and sensations to crude touch intact ?Skin: Dressing at the site of blisters along right upper extremity.  Wound VAC in place at right AKA stump. ? ? ? ?Data Reviewed: I have personally reviewed following labs  and imaging studies ? ?CBC: ?Recent Labs  ?Lab 12/22/21 ?0424 12/23/21 ?0408 12/24/21 ?0350 12/25/21 ?0938 12/26/21 ?1829  ?WBC 5.3 5.6 4.7 7.2 8.4  ?NEUTROABS 4.7 4.9 4.1 6.2 7.0  ?HGB 9.1* 8.3* 8.7* 8.6* 9.0*  ?HCT 28.8* 26.5* 27.4* 28.3* 28.5*  ?MCV 100.7* 101.5* 102.2* 104.0* 102.2*  ?PLT 129* 135* 120* 115* 121*  ? ?Basic Metabolic Panel: ?Recent Labs  ?Lab 12/21/21 ?9371 12/23/21 ?1114 12/24/21 ?6967  12/26/21 ?0603  ?NA 142 142 141 142  ?K 4.0 3.8 4.1 4.4  ?CL 107 110 111 106  ?CO2 27 26 28 30   ?GLUCOSE 184* 129* 152* 140*  ?BUN 27* 37* 45* 41*  ?CREATININE 1.45* 1.42* 1.31* 1.11*  ?CALCIUM 9.4 9.5 9.4 10.3  ? ?GFR: ?Estimated Creatinine Clearance: 56.9 mL/min (A) (by C-G formula based on SCr of 1.11 mg/dL (H)). ?Liver Function Tests: ?No results for input(s): AST, ALT, ALKPHOS, BILITOT, PROT, ALBUMIN in the last 168 hours. ?No results for input(s): LIPASE, AMYLASE in the last 168 hours. ?No results for input(s): AMMONIA in the last 168 hours. ?Coagulation Profile: ?No results for input(s): INR, PROTIME in the last 168 hours. ?Cardiac Enzymes: ?No results for input(s): CKTOTAL, CKMB, CKMBINDEX, TROPONINI in the last 168 hours. ?BNP (last 3 results) ?No results for input(s): PROBNP in the last 8760 hours. ?HbA1C: ?No results for input(s): HGBA1C in the last 72 hours. ?CBG: ?Recent Labs  ?Lab 12/26/21 ?0822 12/26/21 ?1139 12/26/21 ?1413 12/26/21 ?1554 12/26/21 ?2122  ?GLUCAP 127* 93 110* 120* 133*  ? ?Lipid Profile: ?Recent Labs  ?  12/24/21 ?1250  ?TRIG 264*  ? ?Thyroid Function Tests: ?No results for input(s): TSH, T4TOTAL, FREET4, T3FREE, THYROIDAB in the last 72 hours. ?Anemia Panel: ?Recent Labs  ?  12/24/21 ?1250  ?ELFYBOFB51 1,205*  ?FOLATE 11.6  ? ?Sepsis Labs: ?Recent Labs  ?Lab 12/20/21 ?1022 12/20/21 ?1510  ?LATICACIDVEN 0.6 0.6  ? ? ?Recent Results (from the past 240 hour(s))  ?Blood culture (routine x 2)     Status: Abnormal (Preliminary result)  ? Collection Time: 12/17/21 11:19 AM  ? Specimen: Left Antecubital; Blood  ?Result Value Ref Range Status  ? Specimen Description   Final  ?  LEFT ANTECUBITAL ?Performed at [facility], [place] [address], [place], [place] [address] ?  ? Special Requests   Final  ?  BLOOD Blood Culture adequate volume ?Performed at [facility], [place] [address], [place], [place] [address] ?  ? Culture  Setup Time   Final  ?  YEAST ?AEROBIC  BOTTLE ONLY ?CRITICAL RESULT CALLED TO, READ BACK BY AND VERIFIED WITH: [place] ON 12/19/21 @ 1723 BY DRT ?  ? Culture (A)  Final  ?  CANDIDA PARAPSILOSIS ?Sent to [place] for further susceptibility testing. ?Performed at [facility], [place] [address], [place], [place] [address] ?  ? Report Status PENDING  Incomplete  ?Blood Culture ID Panel (Reflexed)     Status: Abnormal  ? Collection Time: 12/17/21 11:19 AM  ?Result Value Ref Range Status  ? Enterococcus faecalis NOT DETECTED NOT DETECTED Final  ? Enterococcus Faecium NOT DETECTED NOT DETECTED Final  ? Listeria monocytogenes NOT DETECTED NOT DETECTED Final  ? Staphylococcus species NOT DETECTED NOT DETECTED Final  ? Staphylococcus aureus (BCID) NOT DETECTED NOT DETECTED Final  ? Staphylococcus epidermidis NOT DETECTED NOT DETECTED Final  ? Staphylococcus lugdunensis NOT DETECTED NOT DETECTED Final  ? Streptococcus species NOT DETECTED NOT DETECTED Final  ? Streptococcus agalactiae  NOT DETECTED NOT DETECTED Final  ? Streptococcus pneumoniae NOT DETECTED NOT DETECTED Final  ? Streptococcus pyogenes NOT DETECTED NOT DETECTED Final  ? A.calcoaceticus-baumannii NOT DETECTED NOT DETECTED Final  ? Bacteroides fragilis NOT DETECTED NOT DETECTED Final  ? Enterobacterales NOT DETECTED NOT DETECTED Final  ? Enterobacter cloacae complex NOT DETECTED NOT DETECTED Final  ? Escherichia coli NOT DETECTED NOT DETECTED Final  ? Klebsiella aerogenes NOT DETECTED NOT DETECTED Final  ? Klebsiella oxytoca NOT DETECTED NOT DETECTED Final  ? Klebsiella pneumoniae NOT DETECTED NOT DETECTED Final  ? Proteus species NOT DETECTED NOT DETECTED Final  ? Salmonella species NOT DETECTED NOT DETECTED Final  ? Serratia marcescens NOT DETECTED NOT DETECTED Final  ? Haemophilus influenzae NOT DETECTED NOT DETECTED Final  ? Neisseria meningitidis NOT DETECTED NOT DETECTED Final  ? Pseudomonas aeruginosa NOT DETECTED NOT DETECTED Final  ? Stenotrophomonas maltophilia NOT DETECTED NOT  DETECTED Final  ? Candida albicans NOT DETECTED NOT DETECTED Final  ? Candida auris NOT DETECTED NOT DETECTED Final  ? Candida glabrata NOT DETECTED NOT DETECTED Final  ? Candida krusei NOT DETECTED NOT DETECTED F

## 2021-12-27 NOTE — Progress Notes (Signed)
Patient ID: Shannon [name], female   DOB: [birth year], 66 y.o.   MRN: [id number] ?Patient is postoperative day 1 above-knee amputation.  She complains of some pain.  Ice was applied.  The wound VAC is functioning well.  Patient will continue with the wound VAC at discharge with the portable pump.  Anticipate discharge to skilled nursing. ?

## 2021-12-27 NOTE — Progress Notes (Signed)
Inpatient Rehab Admissions Coordinator:  ?Consult received. Awaiting therapy re-evaluations post surgery. Will continue to follow. ? ?Gayland Curry, MS, CCC-SLP ?Admissions Coordinator ?[phone] ? ?

## 2021-12-28 DIAGNOSIS — L02611 Cutaneous abscess of right foot: Secondary | ICD-10-CM | POA: Diagnosis not present

## 2021-12-28 DIAGNOSIS — L02619 Cutaneous abscess of unspecified foot: Secondary | ICD-10-CM | POA: Diagnosis not present

## 2021-12-28 DIAGNOSIS — I5033 Acute on chronic diastolic (congestive) heart failure: Secondary | ICD-10-CM | POA: Diagnosis not present

## 2021-12-28 DIAGNOSIS — J9602 Acute respiratory failure with hypercapnia: Secondary | ICD-10-CM | POA: Diagnosis not present

## 2021-12-28 LAB — CBC WITH DIFFERENTIAL/PLATELET
Abs Immature Granulocytes: 0.16 10*3/uL — ABNORMAL HIGH (ref 0.00–0.07)
Basophils Absolute: 0 10*3/uL (ref 0.0–0.1)
Basophils Relative: 0 %
Eosinophils Absolute: 0 10*3/uL (ref 0.0–0.5)
Eosinophils Relative: 1 %
HCT: 22.1 % — ABNORMAL LOW (ref 36.0–46.0)
Hemoglobin: 6.7 g/dL — CL (ref 12.0–15.0)
Immature Granulocytes: 3 %
Lymphocytes Relative: 7 %
Lymphs Abs: 0.4 10*3/uL — ABNORMAL LOW (ref 0.7–4.0)
MCH: 31.6 pg (ref 26.0–34.0)
MCHC: 30.3 g/dL (ref 30.0–36.0)
MCV: 104.2 fL — ABNORMAL HIGH (ref 80.0–100.0)
Monocytes Absolute: 0.4 10*3/uL (ref 0.1–1.0)
Monocytes Relative: 7 %
Neutro Abs: 4.9 10*3/uL (ref 1.7–7.7)
Neutrophils Relative %: 82 %
Platelets: 108 10*3/uL — ABNORMAL LOW (ref 150–400)
RBC: 2.12 MIL/uL — ABNORMAL LOW (ref 3.87–5.11)
RDW: 13.7 % (ref 11.5–15.5)
WBC: 5.9 10*3/uL (ref 4.0–10.5)
nRBC: 1 % — ABNORMAL HIGH (ref 0.0–0.2)

## 2021-12-28 LAB — PREPARE RBC (CROSSMATCH)

## 2021-12-28 LAB — GLUCOSE, CAPILLARY
Glucose-Capillary: 125 mg/dL — ABNORMAL HIGH (ref 70–99)
Glucose-Capillary: 163 mg/dL — ABNORMAL HIGH (ref 70–99)
Glucose-Capillary: 140 mg/dL — ABNORMAL HIGH (ref 70–99)
Glucose-Capillary: 276 mg/dL — ABNORMAL HIGH (ref 70–99)

## 2021-12-28 LAB — HEMOGLOBIN AND HEMATOCRIT, BLOOD
HCT: 24.8 % — ABNORMAL LOW (ref 36.0–46.0)
Hemoglobin: 8.1 g/dL — ABNORMAL LOW (ref 12.0–15.0)

## 2021-12-28 LAB — BASIC METABOLIC PANEL
Anion gap: 3 — ABNORMAL LOW (ref 5–15)
BUN: 36 mg/dL — ABNORMAL HIGH (ref 8–23)
CO2: 34 mmol/L — ABNORMAL HIGH (ref 22–32)
Calcium: 9.8 mg/dL (ref 8.9–10.3)
Chloride: 104 mmol/L (ref 98–111)
Creatinine, Ser: 0.93 mg/dL (ref 0.44–1.00)
GFR, Estimated: >60 mL/min (ref >60)
Glucose, Bld: 155 mg/dL — ABNORMAL HIGH (ref 70–99)
Potassium: 4.2 mmol/L (ref 3.5–5.1)
Sodium: 141 mmol/L (ref 135–145)

## 2021-12-28 LAB — MAGNESIUM: Magnesium: 1.8 mg/dL (ref 1.7–2.4)

## 2021-12-28 LAB — ABO/RH: ABO/RH(D): O POS

## 2021-12-28 LAB — VITAMIN A: Vitamin A (Retinoic Acid): 75.5 ug/dL — ABNORMAL HIGH (ref 22.0–69.5)

## 2021-12-28 MED ORDER — SODIUM CHLORIDE 0.9% IV SOLUTION: Freq: Once | INTRAVENOUS | Status: AC

## 2021-12-28 MED ORDER — STERILE WATER FOR INJECTION IJ SOLN
INTRAMUSCULAR | Status: AC
  Filled 2021-12-28: qty 10

## 2021-12-28 MED ORDER — FUROSEMIDE 10 MG/ML IJ SOLN
20.0000 mg | Freq: Once | INTRAMUSCULAR | Status: AC
  Administered 2021-12-28: 20 mg via INTRAVENOUS
  Filled 2021-12-28: qty 2

## 2021-12-28 MED ORDER — SODIUM CHLORIDE 0.9% IV SOLUTION: Freq: Once | INTRAVENOUS | Status: DC

## 2021-12-28 MED ORDER — ALTEPLASE 2 MG IJ SOLR: 2.0000 mg | Freq: Once | INTRAMUSCULAR | Status: AC

## 2021-12-28 MED ORDER — ALTEPLASE 2 MG IJ SOLR
INTRAMUSCULAR | Status: AC
  Administered 2021-12-28: 2 mg
  Filled 2021-12-28: qty 2

## 2021-12-28 NOTE — Progress Notes (Signed)
?PROGRESS NOTE ? ? ? ?Ocie Bob Delrosario  ?LDJ:[id number]  ?DOB: [birth year]  ?PCP: [name], MD ?Admit date:12/17/2021 ?Chief compliant: worsening RLE wound ?66 y.o. female with medical history significant of metastatic recurrent non-small cell lung cancer (01/2020) s/p curative radiotherapy (03/2020), with recurrence in February 2023, A fib on Xarelto, asthma, anxiety, COPD, GERD, hypertension, hyperlipidemia and HIV was brought in to the emergency department from [facility] due to worsening of the right lower extremity wound. ?ED Course: Afebrile, slightly tachycardic with elevated BP (172/104).  Wbc 11.5.  Her CKD stage IIIa was at baseline.  She was diagnosed with right foot abscess and possible necrotizing fasciitis based on the CT of the lower extremity.  ED physician had consulted with orthopedics who recommended admission under hospitalist service and they will plan surgical intervention. ?Hospital course: Patient admitted to [facility] for Abscess and necrotizing fasciitis of the right lower extremity: Patient was seen by orthopedics Dr. Kathaleen Bury in the ED ->  ordered MRI bilateral lower extremities   Patient admitted with Merrem, vancomycin and oral clindamycin as IV clindamycin backordered.  Patient has allergy to penicillins.  Dilaudid for pain.  She had mild lactic acidosis as well as AKI on CKD (Cr 1.1->1.86) ->IV fluids.  ?4/26: admitted, I&D of LLE ?4/28: started on bipap, altered mentation ?4/29: I&D of RUE, transferred to ICU and intubated ?4/30: Seen by orthopedics for necrotic gangrene of the right foot and right leg, concerning for necrotizing fasciitis-they felt patient did not have adequate soft tissue for BKA.  ?He was noted to have fungemia during the [facility].  Infectious disease following for antibiotics/antifungals.  Her condition worsened with worsening mental status and respiratory status.  She required intubation and transfer to ICU.  She was treated for COPD exacerbation and was extubated on  5/1. Tx to [facility] on 5/2. , ultimately underwent AKA at [facility] on 12/26/21.  ? ?Subjective: ?Patient is POD # 2. BP improved. Afebrile today.  States pain controlled with current pain medications.  Hgb 6.7. On wound vac. Will need SNF per ortho ?Objective: ?Vitals:  ? 12/28/21 0000 12/28/21 0023 12/28/21 0100 12/28/21 0400  ?BP: (!) 126/53 (!) 126/53 (!) 132/53 (!) 159/60  ?Pulse: 70  71 66  ?Resp: 15  16 15   ?Temp: 98.9 ?F (37.2 ?C)   98.5 ?F (36.9 ?C)  ?TempSrc: Axillary   Oral  ?SpO2: 100%  99% 100%  ?Weight:      ?Height:      ? ? ?Intake/Output Summary (Last 24 hours) at 12/28/2021 0755 ?Last data filed at 12/28/2021 0500 ?Gross per 24 hour  ?Intake 435.08 ml  ?Output 1225 ml  ?Net -789.92 ml  ? ? ?Filed Weights  ? 12/24/21 0500 12/25/21 0500 12/27/21 0417  ?Weight: 108 kg 110.3 kg 101.1 kg  ? ? ?Physical Examination: ? ?General: Obese female, no acute distress noted ?Head ENT: Atraumatic normocephalic, PERRLA, neck supple ?Heart: S1-S2 heard, regular rate and rhythm, no murmurs.  No leg edema noted ?Lungs: Equal air entry bilaterally, no rhonchi or rales on exam, no accessory muscle use ?Abdomen: Bowel sounds heard, soft, nontender, nondistended. No organomegaly.  No CVA tenderness ?Extremities: 1+ pitting pedal edema along left lower extremity.  S/p RT AKA with wound VAC in place.  Right upper extremity covered in dressing ?Neurological: Awake alert oriented x3, no focal weakness or numbness, strength and sensations to crude touch intact ?Skin: Right upper extremity wound appears clean, dressing opened while I was in the room-see picture below.  Wound VAC in place at right AKA stump. ? ? ? ? ?Data Reviewed: I have personally reviewed following labs and imaging studies ? ?CBC: ?Recent Labs  ?Lab 12/23/21 ?0408 12/24/21 ?8588 12/25/21 ?5027 12/26/21 ?7412 12/28/21 ?8786  ?WBC 5.6 4.7 7.2 8.4 5.9  ?NEUTROABS 4.9 4.1 6.2 7.0 4.9  ?HGB 8.3* 8.7* 8.6* 9.0* 6.7*  ?HCT 26.5* 27.4* 28.3* 28.5* 22.1*  ?MCV 101.5* 102.2* 104.0*  102.2* 104.2*  ?PLT 135* 120* 115* 121* 108*  ? ? ?Basic Metabolic Panel: ?Recent Labs  ?Lab 12/23/21 ?1114 12/24/21 ?7672 12/26/21 ?0947 12/28/21 ?0962  ?NA 142 141 142 141  ?K 3.8 4.1 4.4 4.2  ?CL 110 111 106 104  ?CO2 26 28 30  34*  ?GLUCOSE 129* 152* 140* 155*  ?BUN 37* 45* 41* 36*  ?CREATININE 1.42* 1.31* 1.11* 0.93  ?CALCIUM 9.5 9.4 10.3 9.8  ?MG  --   --   --  1.8  ? ? ?GFR: ?Estimated Creatinine Clearance: 67.9 mL/min (by C-G formula based on SCr of 0.93 mg/dL). ?Liver Function Tests: ?No results for input(s): AST, ALT, ALKPHOS, BILITOT, PROT, ALBUMIN in the last 168 hours. ?No results for input(s): LIPASE, AMYLASE in the last 168 hours. ?No results for input(s): AMMONIA in the last 168 hours. ?Coagulation Profile: ?No results for input(s): INR, PROTIME in the last 168 hours. ?Cardiac Enzymes: ?No results for input(s): CKTOTAL, CKMB, CKMBINDEX, TROPONINI in the last 168 hours. ?BNP (last 3 results) ?No results for input(s): PROBNP in the last 8760 hours. ?HbA1C: ?No results for input(s): HGBA1C in the last 72 hours. ?CBG: ?Recent Labs  ?Lab 12/26/21 ?2122 12/27/21 ?0756 12/27/21 ?1156 12/27/21 ?1554 12/27/21 ?2219  ?GLUCAP 133* 104* 173* 224* 196*  ? ? ?Lipid Profile: ?No results for input(s): CHOL, HDL, LDLCALC, TRIG, CHOLHDL, LDLDIRECT in the last 72 hours. ? ?Thyroid Function Tests: ?No results for input(s): TSH, T4TOTAL, FREET4, T3FREE, THYROIDAB in the last 72 hours. ?Anemia Panel: ?No results for input(s): VITAMINB12, FOLATE, FERRITIN, TIBC, IRON, RETICCTPCT in the last 72 hours. ? ?Sepsis Labs: ?No results for input(s): PROCALCITON, LATICACIDVEN in the last 168 hours. ? ? ?Recent Results (from the past 240 hour(s))  ?Aerobic/Anaerobic Culture w Gram Stain (surgical/deep wound)     Status: None  ? Collection Time: 12/20/21 12:03 PM  ? Specimen: Soft Tissue, Other  ?Result Value Ref Range Status  ? Specimen Description   Final  ?  TISSUE ?Performed at [facility], [place] [address] [place], [place] [address] ?  ? Special Requests   Final  ?  ABSCESS ?Performed at [facility], [place] [address] [place], [place] [address] ?  ? Gram Stain NO WBC SEEN ?NO ORGANISMS SEEN ?  Final  ? Culture   Final  ?  No growth aerobically or anaerobically. ?Performed at [facility], [place] [address] [place], [place] [address] ?  ? Report Status 12/25/2021 FINAL  Final  ?MRSA Next Gen by PCR, Nasal     Status: None  ? Collection Time: 12/20/21  4:18 PM  ? Specimen: Nasal Mucosa; Nasal Swab  ?Result Value Ref Range Status  ? MRSA by PCR Next Gen NOT DETECTED NOT DETECTED Final  ?  Comment: (NOTE) ?The GeneXpert MRSA Assay (FDA approved for NASAL specimens only), ?is one component of a comprehensive MRSA colonization surveillance ?program. It is not intended to diagnose MRSA infection nor to guide ?or monitor treatment for MRSA infections. ?Test performance is not FDA approved in patients less  than 2 years ?old. ?Performed at [facility], [place] [address] ?[place], [place] [address] ?  ?Culture, blood (Routine X 2) w Reflex to ID Panel     Status: None  ? Collection Time: 12/22/21 11:28 AM  ? Specimen: BLOOD  ?Result Value Ref Range Status  ? Specimen Description   Final  ?  BLOOD BLOOD LEFT HAND ?Performed at [facility], [place] [address], [place], [place] [address] ?  ? Special Requests   Final  ?  Blood Culture adequate volume IN PEDIATRIC BOTTLE ?Performed at [facility], [place] [name] [place], [place] [address] ?  ? Culture   Final  ?  NO GROWTH 5 DAYS ?Performed at [facility], [place] [address], [place], [place] [address] ?  ? Report Status 12/27/2021 FINAL  Final  ?Culture, blood (Routine X 2) w Reflex to ID Panel     Status: None  ? Collection Time: 12/22/21  5:00 PM  ? Specimen: BLOOD  ?Result Value Ref Range Status  ? Specimen Description   Final  ?  BLOOD BLOOD LEFT HAND ?Performed at [facility],  [place] [address], [place], [place] [address] ?  ? Special Requests   Final  ?  IN PEDIATRIC BOTTLE Blood Culture adequate volume ?Performed at [facility], [place] [address] [place], [place]

## 2021-12-28 NOTE — Progress Notes (Signed)
Physical Therapy Re-Evaluation ?Patient Details ?Name: Shannon [name] ?MRN: [id number] ?DOB: [birth year] ?Today's Date: 12/28/2021 ? ?History of Present Illness ? Karime Scheuermann is a 66 y.o. woman with metastatic non-small cell lung cancer, asthma/COPD, GERD, HTN, and HIV who was admitted 12/17/21  due to right lower extremity wound  S/P irrigation and debridement of right foot and calf wounds on 12/17/21 .  On 4/28 she developed swelling of her right upper extremity and went for incision and drainage morning of 4/29 without overt signs of necrotizing fasciitis. She developed progressive mental status changes 4/28 to 4/29 with obtundation. She was transferred to the ICU and PCCM consulted for hypercapnic respiratory failure.  Intubated 4/29 and extubated 12/22/21. Pt underwent Rt AKA on 12/26/21. ? ? ?Hospital course: Patient admitted for Abscess and necrotizing fasciitis of the right lower extremity:  ?4/26: admitted, I&D of RLE ?4/28: started on bipap, altered mentation ?4/29: I&D of RUE, transferred to ICU and intubated ?4/30: Seen by orthopedics for necrotic gangrene of the right foot and right leg, concerning for necrotizing fasciitis-they felt patient did not have adequate soft tissue for BKA ?5/1: extubated ?5/5: underwent Rt AKA at [facility] ? ?  ?Clinical Impression ? Patient re-evaluated and goals updated now she is POD 2 Rt AKA. She required increased assist for bed mobility today and was limited by pain in bil LE's, slight confusion, and fear/anxiety about mobilizing. Patient required max +2 assist to pivot to EOB and min-mod assist to maintain seated balance. Pt was able to complete LAQ with Lt LE but required verbal/tactile cues. VSS throughout but pt fatigued. Returned to supine EOS and repositioned in part chair position. Lt LE noted to be weeping and RN completed dressing change. Discharge recommendations updated to [facility] for intense rehab following Rt AKA. Will continue to progress as able. ?   ? ?Recommendations for  follow up therapy are one component of a multi-disciplinary discharge planning process, led by the attending physician.  Recommendations may be updated based on patient status, additional functional criteria and insurance authorization. ? ?Follow Up Recommendations Acute inpatient rehab (3hours/day) ? ?  ?Assistance Recommended at Discharge Frequent or constant Supervision/Assistance  ?Patient can return home with the following ? Two people to help with walking and/or transfers;Assistance with cooking/housework;Assist for transportation;Help with stairs or ramp for entrance;Two people to help with bathing/dressing/bathroom;Direct supervision/assist for medications management ? ?  ?Equipment Recommendations None recommended by PT  ?Recommendations for Other Services ? Rehab consult  ?  ?Functional Status Assessment Patient has had a recent decline in their functional status and demonstrates the ability to make significant improvements in function in a reasonable and predictable amount of time.  ? ?  ?Precautions / Restrictions Precautions ?Precautions: Fall ?Precaution Comments: Rt AKA, wound vac, flexiseal, Rt UE I&D dressing in place. ?Restrictions ?Weight Bearing Restrictions: Yes ?RLE Weight Bearing: Non weight bearing ?Other Position/Activity Restrictions: Rt AKA  ? ?  ? ?Mobility ? Bed Mobility ?Overal bed mobility: Needs Assistance ?Bed Mobility: Supine to Sit, Sit to Supine ?  ?  ?Supine to sit: Max assist, +2 for physical assistance, +2 for safety/equipment, HOB elevated ?Sit to supine: Total assist, +2 for physical assistance, +2 for safety/equipment ?  ?General bed mobility comments: Max Assist to reach bil UE's towards rails to assist with raising/pivoting trunk. Pt initiated walking Lt LE towards EOB but Mod assist needed to fully bring off side of bed. Max Assist with bed pad and 2+ to pivot to edge and raise trunk upright.  Total assist to control lowering trunk to bed and bring Lt LE up. ?   ? ?Transfers ?  ?  ?  ?  ?  ?  ?  ?  ?  ?  ?  ? ?Ambulation/Gait ?  ?  ?  ?  ?  ?  ?  ?  ? ?Stairs ?  ?  ?  ?  ?  ? ?Wheelchair Mobility ?  ? ?Modified Rankin (Stroke Patients Only) ?  ? ?  ? ?Balance Overall balance assessment: Needs assistance ?Sitting-balance support: Feet unsupported, Bilateral upper extremity supported ?Sitting balance-Leahy Scale: Poor ?Sitting balance - Comments: Mod Assist to obtain balance and place Rt UE on EOB for support. Min-Mod assist intermittently to steady sitting balance. pt able to hold trunk still for short periods but then tended to lean forward towards therapist. ?  ?  ?  ?  ?  ?  ?  ?  ?  ?  ?  ?  ?  ?  ?  ?   ? ? ? ?Pertinent Vitals/Pain Pain Assessment ?Pain Assessment: Faces ?Faces Pain Scale: Hurts even more ?Pain Location: LE's and generalized pain with mobility ?Pain Descriptors / Indicators: Discomfort, Grimacing, Guarding, Moaning ?Pain Intervention(s): Limited activity within patient's tolerance, Monitored during session, Repositioned  ? ? ?Home Living Family/patient expects to be discharged to:: Private residence ?Living Arrangements: Alone ?Available Help at Discharge: Family;Available PRN/intermittently ?Type of Home: House ?Home Access: Stairs to enter ?  ?Entrance Stairs-Number of Steps: back door none, front door 8 ?Alternate Level Stairs-Number of Steps: flight with landing ?Home Layout: Two level;Bed/bath upstairs ?Home Equipment: Conservation officer, nature (2 wheels) ?   ?  ?Prior Function Prior Level of Function : Independent/Modified Independent ?  ?  ?  ?  ?  ?  ?  ?  ?  ? ? ?Hand Dominance  ? Dominant Hand: Right ? ?  ?Extremity/Trunk Assessment  ? Upper Extremity Assessment ?Upper Extremity Assessment: RUE deficits/detail ?RUE Deficits / Details: Edematous R hand with dressing around R forearm 2* fasciotomy. Able to raise R shoulder ~90 degrees and flex elbow. pt ablet o touch thumb to index finger but unable to oppose thumb to last 3 digits. ?  ? ?Lower Extremity  Assessment ?Lower Extremity Assessment: RLE deficits/detail;LLE deficits/detail ?RLE Deficits / Details: dressing intact with wound vac inplace for Rt AKA. ?LLE Deficits / Details: Pt able to initiate moving abd/add and lift slightly above bed surface, limited by pain/weakness. small wounds on lower leg weaping serousanguineous drainage. ?  ? ?Cervical / Trunk Assessment ?Cervical / Trunk Assessment: Other exceptions;Normal ?Cervical / Trunk Exceptions: habitus  ?Communication  ? Communication: No difficulties  ?Cognition Arousal/Alertness: Awake/alert ?Behavior During Therapy: Anxious ?Overall Cognitive Status: Impaired/Different from baseline ?Area of Impairment: Orientation, Memory, Safety/judgement, Following commands, Awareness, Problem solving, Attention ?  ?  ?  ?  ?  ?  ?  ?  ?Orientation Level: Disoriented to, Time ?Current Attention Level: Selective ?Memory: Decreased short-term memory ?Following Commands: Follows one step commands with increased time, Follows one step commands inconsistently ?Safety/Judgement: Decreased awareness of safety, Decreased awareness of deficits ?Awareness: Emergent ?Problem Solving: Decreased initiation, Difficulty sequencing, Requires verbal cues, Requires tactile cues ?General Comments: pt anxious and fearful of pain/discomfort ?  ?  ? ?  ?General Comments   ? ?  ?Exercises Other Exercises ?Other Exercises: LAQ: 8 reps at EOB, cues for 3 sec holds, pt swinging LE at times rather than activating quad ?Other Exercises: attempted  Rt hand digit opposition (AAROM provided) stress ball provided and pt demonstrated ability to grip  ? ?Assessment/Plan  ?  ?PT Assessment Patient needs continued PT services  ?PT Problem List Decreased strength;Decreased mobility;Decreased safety awareness;Decreased range of motion;Decreased knowledge of precautions;Obesity;Decreased activity tolerance;Decreased balance;Pain ? ?   ?  ?PT Treatment Interventions DME instruction;Therapeutic  activities;Gait training;Therapeutic exercise;Patient/family education;Functional mobility training;Balance training;Neuromuscular re-education;Wheelchair mobility training   ? ?PT Goals (Current goals can be found in the Care P

## 2021-12-29 ENCOUNTER — Ambulatory Visit: Payer: Medicare Other | Admitting: Adult Health

## 2021-12-29 ENCOUNTER — Encounter (HOSPITAL_COMMUNITY): Payer: Self-pay | Admitting: Orthopedic Surgery

## 2021-12-29 ENCOUNTER — Other Ambulatory Visit (HOSPITAL_COMMUNITY): Payer: Self-pay

## 2021-12-29 DIAGNOSIS — A480 Gas gangrene: Secondary | ICD-10-CM | POA: Diagnosis not present

## 2021-12-29 DIAGNOSIS — B20 Human immunodeficiency virus [HIV] disease: Secondary | ICD-10-CM | POA: Diagnosis not present

## 2021-12-29 DIAGNOSIS — L02611 Cutaneous abscess of right foot: Secondary | ICD-10-CM | POA: Diagnosis not present

## 2021-12-29 DIAGNOSIS — B49 Unspecified mycosis: Secondary | ICD-10-CM | POA: Diagnosis not present

## 2021-12-29 DIAGNOSIS — C3492 Malignant neoplasm of unspecified part of left bronchus or lung: Secondary | ICD-10-CM | POA: Diagnosis not present

## 2021-12-29 LAB — CBC WITH DIFFERENTIAL/PLATELET
Abs Immature Granulocytes: 0.19 10*3/uL — ABNORMAL HIGH (ref 0.00–0.07)
Basophils Absolute: 0 10*3/uL (ref 0.0–0.1)
Basophils Relative: 0 %
Eosinophils Absolute: 0.1 10*3/uL (ref 0.0–0.5)
Eosinophils Relative: 1 %
HCT: 22.2 % — ABNORMAL LOW (ref 36.0–46.0)
Hemoglobin: 7 g/dL — ABNORMAL LOW (ref 12.0–15.0)
Immature Granulocytes: 3 %
Lymphocytes Relative: 9 %
Lymphs Abs: 0.5 10*3/uL — ABNORMAL LOW (ref 0.7–4.0)
MCH: 31.7 pg (ref 26.0–34.0)
MCHC: 31.5 g/dL (ref 30.0–36.0)
MCV: 100.5 fL — ABNORMAL HIGH (ref 80.0–100.0)
Monocytes Absolute: 0.4 10*3/uL (ref 0.1–1.0)
Monocytes Relative: 6 %
Neutro Abs: 5 10*3/uL (ref 1.7–7.7)
Neutrophils Relative %: 81 %
Platelets: 126 10*3/uL — ABNORMAL LOW (ref 150–400)
RBC: 2.21 MIL/uL — ABNORMAL LOW (ref 3.87–5.11)
RDW: 15.5 % (ref 11.5–15.5)
WBC: 6.2 10*3/uL (ref 4.0–10.5)
nRBC: 0.7 % — ABNORMAL HIGH (ref 0.0–0.2)

## 2021-12-29 LAB — BASIC METABOLIC PANEL
Anion gap: 4 — ABNORMAL LOW (ref 5–15)
BUN: 41 mg/dL — ABNORMAL HIGH (ref 8–23)
CO2: 36 mmol/L — ABNORMAL HIGH (ref 22–32)
Calcium: 9.9 mg/dL (ref 8.9–10.3)
Chloride: 104 mmol/L (ref 98–111)
Creatinine, Ser: 1 mg/dL (ref 0.44–1.00)
GFR, Estimated: >60 mL/min (ref >60)
Glucose, Bld: 143 mg/dL — ABNORMAL HIGH (ref 70–99)
Potassium: 3.9 mmol/L (ref 3.5–5.1)
Sodium: 144 mmol/L (ref 135–145)

## 2021-12-29 LAB — GLUCOSE, CAPILLARY
Glucose-Capillary: 115 mg/dL — ABNORMAL HIGH (ref 70–99)
Glucose-Capillary: 210 mg/dL — ABNORMAL HIGH (ref 70–99)
Glucose-Capillary: 231 mg/dL — ABNORMAL HIGH (ref 70–99)
Glucose-Capillary: 171 mg/dL — ABNORMAL HIGH (ref 70–99)

## 2021-12-29 LAB — CBC
HCT: 25.4 % — ABNORMAL LOW (ref 36.0–46.0)
Hemoglobin: 8.2 g/dL — ABNORMAL LOW (ref 12.0–15.0)
MCH: 32.3 pg (ref 26.0–34.0)
MCHC: 32.3 g/dL (ref 30.0–36.0)
MCV: 100 fL (ref 80.0–100.0)
Platelets: 137 10*3/uL — ABNORMAL LOW (ref 150–400)
RBC: 2.54 MIL/uL — ABNORMAL LOW (ref 3.87–5.11)
RDW: 16.5 % — ABNORMAL HIGH (ref 11.5–15.5)
WBC: 7 10*3/uL (ref 4.0–10.5)
nRBC: 0.4 % — ABNORMAL HIGH (ref 0.0–0.2)

## 2021-12-29 LAB — PREPARE RBC (CROSSMATCH)

## 2021-12-29 LAB — MAGNESIUM: Magnesium: 1.7 mg/dL (ref 1.7–2.4)

## 2021-12-29 MED ORDER — SODIUM CHLORIDE 0.9% IV SOLUTION: Freq: Once | INTRAVENOUS | Status: AC

## 2021-12-29 MED ORDER — FLUCONAZOLE 100 MG PO TABS
400.0000 mg | ORAL_TABLET | Freq: Every day | ORAL | Status: AC
  Administered 2021-12-29 – 2022-01-04 (×7): 400 mg via ORAL
  Filled 2021-12-29 (×7): qty 4

## 2021-12-29 NOTE — Progress Notes (Signed)
Physical Therapy Treatment ?Patient Details ?Name: Shannon [name] ?MRN: [id number] ?DOB: 09/15/1955 ?Today's Date: 12/29/2021 ? ? ?History of Present Illness Shannon [name] is a 66 y.o. woman with metastatic non-small cell lung cancer, asthma/COPD, GERD, HTN, and HIV who was admitted 12/17/21  due to right lower extremity wound  S/P irrigation and debridement of right foot and calf wounds on 12/17/21 .  On 4/28 she developed swelling of her right upper extremity and went for incision and drainage morning of 4/29 without overt signs of necrotizing fasciitis. She developed progressive mental status changes 4/28 to 4/29 with obtundation. She was transferred to the ICU and PCCM consulted for hypercapnic respiratory failure.  Intubated 4/29 and extubated 12/22/21. Pt underwent Rt AKA on 12/26/21. ? ?  ?PT Comments  ? ? The patient required frequent cues to follow simple directions, easily distracted and indicating right  residual limb pain. + 2 max/total Assist with  rolling and getting  washed up. Continue PT for mobility.  ?Recommendations for follow up therapy are one component of a multi-disciplinary discharge planning process, led by the attending physician.  Recommendations may be updated based on patient status, additional functional criteria and insurance authorization. ? ?Follow Up Recommendations ? Acute inpatient rehab (3hours/day) ?  ?  ?Assistance Recommended at Discharge Frequent or constant Supervision/Assistance  ?Patient can return home with the following Two people to help with walking and/or transfers;Assistance with cooking/housework;Assist for transportation;Help with stairs or ramp for entrance;Two people to help with bathing/dressing/bathroom;Direct supervision/assist for medications management ?  ?Equipment Recommendations ? Wheelchair cushion (measurements PT);Wheelchair (measurements PT)  ?  ?Recommendations for Other Services   ? ? ?  ?Precautions / Restrictions Precautions ?Precaution Comments: Rt AKA,  wound vac, flexiseal, Rt UE I&D dressing in place. ?Restrictions ?Other Position/Activity Restrictions: Rt AKA  ?  ? ?Mobility ? Bed Mobility ?  ?Bed Mobility: Rolling ?Rolling: +2 for physical assistance, +2 for safety/equipment, Total assist ?  ?  ?  ?  ?General bed mobility comments: multimodal cues to reach for rails, assist with legs to facilitate rolling to get cleaned up. ?  ? ?Transfers ?  ?  ?  ?  ?  ?  ?  ?  ?  ?  ?  ? ?Ambulation/Gait ?  ?  ?  ?  ?  ?  ?  ?  ? ? ?Stairs ?  ?  ?  ?  ?  ? ? ?Wheelchair Mobility ?  ? ?Modified Rankin (Stroke Patients Only) ?  ? ? ?  ?Balance   ?  ?  ?Sitting balance - Comments: deferred ?  ?  ?  ?  ?  ?  ?  ?  ?  ?  ?  ?  ?  ?  ?  ?  ? ?  ?Cognition Arousal/Alertness: Awake/alert ?Behavior During Therapy: Anxious ?Overall Cognitive Status: Impaired/Different from baseline ?Area of Impairment: Orientation, Memory, Safety/judgement, Following commands, Awareness, Problem solving, Attention ?  ?  ?  ?  ?  ?  ?  ?  ?Orientation Level: Disoriented to, Time ?Current Attention Level: Sustained ?  ?Following Commands: Follows one step commands with increased time, Follows one step commands inconsistently ?Safety/Judgement: Decreased awareness of safety, Decreased awareness of deficits ?Awareness: Emergent ?  ?General Comments: pt anxious and fearful of pain/discomfort ?  ?  ? ?  ?Exercises   ? ?  ?General Comments   ?  ?  ? ?Pertinent Vitals/Pain Pain Assessment ?Faces Pain Scale: Hurts even more ?  Pain Location: RLE's and generalized pain with mobility, ?Pain Descriptors / Indicators: Discomfort, Grimacing, Guarding, Moaning ?Pain Intervention(s): Monitored during session, Limited activity within patient's tolerance  ? ? ?Home Living   ?  ?  ?  ?  ?  ?  ?  ?  ?  ?   ?  ?Prior Function    ?  ?  ?   ? ?PT Goals (current goals can now be found in the care plan section) Progress towards PT goals: Progressing toward goals ? ?  ?Frequency ? ? ? Min 3X/week ? ? ? ?  ?PT Plan Current plan  remains appropriate  ? ? ?Co-evaluation PT/OT/SLP Co-Evaluation/Treatment: Yes ?Reason for Co-Treatment: For patient/therapist safety;Complexity of the patient's impairments (multi-system involvement) ?PT goals addressed during session: Mobility/safety with mobility ?OT goals addressed during session: ADL's and self-care ?  ? ?  ?AM-PAC PT "6 Clicks" Mobility   ?Outcome Measure ? Help needed turning from your back to your side while in a flat bed without using bedrails?: Total ?Help needed moving from lying on your back to sitting on the side of a flat bed without using bedrails?: Total ?Help needed moving to and from a bed to a chair (including a wheelchair)?: Total ?Help needed standing up from a chair using your arms (e.g., wheelchair or bedside chair)?: Total ?Help needed to walk in hospital room?: Total ?Help needed climbing 3-5 steps with a railing? : Total ?6 Click Score: 6 ? ?  ?End of Session Equipment Utilized During Treatment: Oxygen ?Activity Tolerance: Patient limited by pain ?Patient left: in bed;with call bell/phone within reach;with bed alarm set ?Nurse Communication: Mobility status ?PT Visit Diagnosis: Unsteadiness on feet (R26.81);Pain ?Pain - Right/Left: Right ?Pain - part of body: Leg ?  ? ? ?Time: 6270-3500 ?PT Time Calculation (min) (ACUTE ONLY): 27 min ? ?Charges:  $Therapeutic Activity: 8-22 mins          ?          ? ?Tresa Endo PT ?Acute Rehabilitation Services ?Pager [phone] ?Office [phone] ? ? ? ?Claretha Cooper ?12/29/2021, 2:54 PM ? ?

## 2021-12-29 NOTE — Progress Notes (Signed)
Patient ID: Shannon [name], female   DOB: February 16, 1956, 66 y.o.   MRN: [id number] ?Patient is postoperative day 3 right above-the-knee amputation.  There is no drainage in the wound VAC canister, patient denies pain.  Anticipate patient will need discharge to skilled nursing facility.  Will discontinue the wound VAC at the time of discharge. ?

## 2021-12-29 NOTE — Progress Notes (Signed)
?   ? ?[facility] for Infectious Disease ? ?Date of Admission:  12/17/2021    ?       ?Reason for visit: Follow up on Candidemia ? ?Current antibiotics: ?Unasyn ?Fluconazole ? ?ASSESSMENT:   ? ?66 y.o. female admitted with: ? ?Necrotizing fasciitis of the right leg: Status post initial I&D with polymicrobial cultures on 4/26.  Status post definitive AKA on 5/5. ?Candidemia: Currently on fluconazole and suspected from her necrotizing fasciitis with 1 out of 4 cultures positive on 4/26.  Repeat blood cultures 5/1 finalized as no growth.  Ophthalmology planning for to see her this admission.   ?Right forearm swelling: Status post I&D on 4/29 with negative cultures and intraoperative findings that did not suggest infection. ?HIV disease: Currently on Biktarvy. ?Recurrent lung cancer: Needs follow-up with oncology at discharge. ? ?RECOMMENDATIONS:   ? ?Stop Unasyn ?Continue fluconazole.  Plan for 14 days from negative cultures with end date 01/05/2022. ?I think can hold off on Echo ?Appreciate ophthalmology seeing patient for eye exam in the setting of Candida fungemia ?Continue Biktarvy ?Will sign off, please call as needed.  ? ? ?Principal Problem: ?  Abscess of right foot ?Active Problems: ?  HIV disease ([place]) ?  Uncontrolled hypertension ?  Morbid (severe) obesity due to excess calories ([place]) ?  Atrial flutter ([place]) ?  Chronic respiratory failure with hypoxia ([facility]) ?  Chronic anticoagulation ?  (HFpEF) heart failure with preserved ejection fraction ([place]) ?  Adenocarcinoma of lung ([place]) ?  Abscess of foot ?  Necrotizing fasciitis ([place]) ?  Pressure injury of skin ?  Fungemia ?  Acute respiratory failure with hypercapnia ([facility]) ?  Macrocytic anemia ? ? ? ?MEDICATIONS:   ? ?Scheduled Meds: ? (feeding supplement) PROSource Plus  30 mL Oral BID BM  ? sodium chloride   Intravenous Once  ? sodium chloride   Intravenous Once  ? arformoterol  15 mcg Nebulization BID  ? vitamin C  1,000 mg Oral Daily  ?  bictegravir-emtricitabine-tenofovir AF  1 tablet Oral Daily  ? budesonide (PULMICORT) nebulizer solution  0.5 mg Nebulization BID  ? carvedilol  12.5 mg Oral BID WC  ? chlorhexidine  15 mL Mouth Rinse BID  ? Chlorhexidine Gluconate Cloth  6 each Topical Daily  ? diltiazem  90 mg Oral Q6H  ? docusate sodium  100 mg Oral Daily  ? famotidine  20 mg Oral Daily  ? feeding supplement  237 mL Oral BID BM  ? hydrALAZINE  25 mg Oral TID  ? insulin aspart  0-15 Units Subcutaneous TID WC  ? insulin aspart  0-5 Units Subcutaneous QHS  ? mouth rinse  15 mL Mouth Rinse q12n4p  ? multivitamin with minerals  1 tablet Oral Daily  ? nutrition supplement (JUVEN)  1 packet Oral BID BM  ? pantoprazole  40 mg Oral Daily  ? polyethylene glycol  17 g Oral Daily  ? predniSONE  20 mg Oral Q breakfast  ? Followed by  ? [START ON 12/31/2021] predniSONE  10 mg Oral Q breakfast  ? QUEtiapine  25 mg Oral QHS  ? revefenacin  175 mcg Nebulization Daily  ? sodium chloride flush  10-40 mL Intracatheter Q12H  ? spironolactone  25 mg Oral Daily  ? zinc sulfate  220 mg Oral Daily  ? ?Continuous Infusions: ? sodium chloride 0 mL/hr at 12/27/21 0426  ? ampicillin-sulbactam (UNASYN) IV Stopped (12/29/21 1017)  ? fluconazole (DIFLUCAN) IV Stopped (12/28/21 1620)  ? magnesium sulfate  bolus IVPB    ? ?PRN Meds:.sodium chloride, [DISCONTINUED] acetaminophen **OR** acetaminophen, acetaminophen, albuterol, alum & mag hydroxide-simeth, bisacodyl, guaiFENesin-dextromethorphan, hydrALAZINE, hydrALAZINE, HYDROmorphone (DILAUDID) injection, HYDROmorphone, labetalol, lip balm, magnesium sulfate bolus IVPB, metoprolol tartrate, metoprolol tartrate, ondansetron **OR** ondansetron (ZOFRAN) IV, ondansetron, oxyCODONE, oxyCODONE, phenol, polyethylene glycol, potassium chloride, sodium chloride flush ? ?SUBJECTIVE:  ? ?24 hour events:  ?No acute events noted overnight ?Afebrile, Tmax 99.1 ?Status post right AKA 5/5 with Dr. Sharol Given ?Repeat blood cultures 5/1 finalized no  growth ? ? ? ?No new complaint.  Pain under control currently.  No fevers or chills. ? ?Review of Systems  ?All other systems reviewed and are negative. ? ?  ?OBJECTIVE:  ? ?Blood pressure (!) 145/48, pulse 77, temperature 98.3 ?F (36.8 ?C), temperature source Oral, resp. rate 17, height 5\' 4"  (1.626 m), weight 100.3 kg, SpO2 97 %. ?Body mass index is 37.96 kg/m?. ? ?Physical Exam ?Constitutional:   ?   General: She is not in acute distress. ?   Appearance: Normal appearance.  ?HENT:  ?   Head: Normocephalic and atraumatic.  ?Eyes:  ?   Extraocular Movements: Extraocular movements intact.  ?   Conjunctiva/sclera: Conjunctivae normal.  ?Pulmonary:  ?   Effort: Pulmonary effort is normal. No respiratory distress.  ?Abdominal:  ?   General: There is no distension.  ?   Palpations: Abdomen is soft.  ?   Tenderness: There is no abdominal tenderness.  ?Musculoskeletal:  ?   Comments: Status post right AKA.  Wound VAC in place. ?Right forearm dressing clean dry and intact  ?Skin: ?   General: Skin is warm and dry.  ?Neurological:  ?   General: No focal deficit present.  ?   Mental Status: She is alert and oriented to person, place, and time.  ?Psychiatric:     ?   Mood and Affect: Mood normal.     ?   Behavior: Behavior normal.  ? ? ? ?Lab Results: ?Lab Results  ?Component Value Date  ? WBC 6.2 12/29/2021  ? HGB 7.0 (L) 12/29/2021  ? HCT 22.2 (L) 12/29/2021  ? MCV 100.5 (H) 12/29/2021  ? PLT 126 (L) 12/29/2021  ?  ?Lab Results  ?Component Value Date  ? NA 144 12/29/2021  ? K 3.9 12/29/2021  ? CO2 36 (H) 12/29/2021  ? GLUCOSE 143 (H) 12/29/2021  ? BUN 41 (H) 12/29/2021  ? CREATININE 1.00 12/29/2021  ? CALCIUM 9.9 12/29/2021  ? GFRNONAA >60 12/29/2021  ? GFRAA 47 (L) 05/24/2020  ?  ?Lab Results  ?Component Value Date  ? ALT 38 12/18/2021  ? AST 18 12/18/2021  ? ALKPHOS 73 12/18/2021  ? BILITOT 0.4 12/18/2021  ? ? ?   ?Component Value Date/Time  ? CRP 8.0 (H) 12/17/2021 2102  ? CRP 7 09/19/2021 1155  ? ? ?   ?Component Value  Date/Time  ? ESRSEDRATE 15 12/17/2021 2102  ? ?  ?I have reviewed the micro and lab results in Epic. ? ?Imaging: ?No results found.  ? ?Imaging independently reviewed in Epic.  ? ? ?Mignon Pine ?[facility] for Infectious Disease ?[place] ?[phone] pager ?12/29/2021, 10:51 AM ? ? ?

## 2021-12-29 NOTE — Progress Notes (Addendum)
?PROGRESS NOTE ?Shannon [name]  KZS:[id number] DOB: 09-07-1955 DOA: 12/17/2021 ?PCP: [name], MD  ? ?Brief Narrative/Hospital Course: ?66 year old female with a history of metastatic adenocarcinoma of lung, COPD, HIV, was admitted to the hospital with right lower extremity wound, necrotizing fascitis,seen by orthopedics and underwent incision and drainage.  He was noted to have fungemia.  Infectious disease following for antibiotics/antifungals. Overall, her condition worsened with worsening mental status and respiratory status.She required intubation and transfer to ICU.  She was treated for COPD exacerbation and since then, respiratory status has stabilized and she was extubated on 5/1.  Orthopedics following for gas gangrene/necrotizing fasciitis of her right lower extremity and plans are for amputation, underwent Rt AKA at [facility] 12/26/21. ?  ?Subjective: ?Seen and examined this morning, resting comfortably. ?Overnight no fever, ?On BiPAP qhs ?H/h down to 7.1 ?Minimal output from vac drain per nursing staff. ? ?Assessment and Plan: ?Principal Problem: ?  Abscess of right foot ?Active Problems: ?  HIV disease ([place]) ?  Uncontrolled hypertension ?  Morbid (severe) obesity due to excess calories ([place]) ?  Atrial flutter ([place]) ?  Chronic respiratory failure with hypoxia (HCC) ?  Chronic anticoagulation ?  (HFpEF) heart failure with preserved ejection fraction ([place]) ?  Adenocarcinoma of lung ([place]) ?  Abscess of foot ?  Necrotizing fasciitis ([place]) ?  Pressure injury of skin ?  Fungemia ?  Acute respiratory failure with hypercapnia (HCC) ?  Macrocytic anemia ?  ?Necrotizing fasciitis and abscess RLE ?Sepsis w/ Candididemia and Klebsiella soft tissue infection  RLE of right lower extremity right upper extremity: ?S/P I&D 4/29- OR culture positive with Klebsiella, Proteus and Enterococcus. Dr Sharol Given following s/p Rt AKA 5/5. Candidemia likely due to wound infection translocation, once stable will need TEE to rule out  endocarditis and non emergent ophthalmology eval to rule out endophthalmitis as per ID-I spoke with Dr. Katy Fitch from ophthalmology who will come and take a look at her today or tomorrow since she remains hospitalized. Copnt Unasyn  and Diflucan as per ID. ? ?Acute on chronic respiratory failure with hypoxia and hypercapnia: ?Acute COPD exacerbation: ?Initially needing intubation due to AMS on BiPAP. Off vent 5/1.  Respiratory status is stable continue 2 L nasal: Daytime, BiPAP bedtime and as needed,tapering off prednisone, continue Pulmicort/Brovana, nebulizer, supplemental oxygen , respiratory status stable. Encourage PT OT, IS. ?   ?Metastatic adenocarcinoma of the lung to bone: Follow-up with oncology outpatient. ?  ?Acute metabolic encephalopathy: Multifactorial and also due to hypercapnea.resolved.Cont  Seroquel at bedtime. Continue PT/OT/Supportive care. ?  ?AKI on CKD stage IIIa: Renal function is stable at 1 ?Recent Labs  ?Lab 12/23/21 ?1114 12/24/21 ?7322 12/26/21 ?0254 12/28/21 ?2706 12/29/21 ?0406  ?BUN 37* 45* 41* 36* 41*  ?CREATININE 1.42* 1.31* 1.11* 0.93 1.00  ?  ? ?Chronic diastolic CHF ?Essential hypertension: ?BP was high-Cardizem was increased from 60> 90 gm q6h 12/25/21, on aldactone and metoprolol changed Coreg and titrate  as needed.  Overall stable BP and volume status compensated ?  ?Atrial flutter: Rate controlled, continue current readjusted Cardizem and Coreg. Holding Xarelto for now, once H&H is stable hopefully can resume it.  It was discussed with Dr. Sharol Given 5/7 per Dr Andria Frames. ?  ?HIV disease: Continue home regimen-Biktarvy, ID following ?  ?Macrocytic anemia ?Acute on chronic anemia likely postop ABLA: ?Multifactorial -also with anemia of chronic disease.  Stable-high B12 level, normal folic acid. 1 unit prbc 5/7, will transfuse 1 more unit-discussed risks benefits alternatives, patient is  agreeable.  Holding her Eliquis. ?Recent Labs  ?Lab 12/25/21 ?0453 12/26/21 ?0603 12/28/21 ?1610  12/28/21 ?1620 12/29/21 ?0406  ?HGB 8.6* 9.0* 6.7* 8.1* 7.0*  ?HCT 28.3* 28.5* 22.1* 24.8* 22.2*  ?  ?Type 2 diabetes mellitus: Borderline HbA1c 7.3.  Blood sugar borderline controlled, continue SSI. ?Recent Labs  ?Lab 12/28/21 ?0812 12/28/21 ?1246 12/28/21 ?1548 12/28/21 ?2037 12/29/21 ?9604  ?GLUCAP 125* 163* 140* 276* 115*  ?  ?Class II Obesity:Patient's Body mass index is 37.96 kg/m?. : Will benefit with PCP follow-up, weight loss  healthy lifestyle and outpatient sleep evaluation. ? ?Pressure ulcer right buttock stage I POA- as below ?Pressure Injury 12/20/21 Buttocks Right Stage 1 -  Intact skin with non-blanchable redness of a localized area usually over a bony prominence. (Active)  ?12/20/21 1600  ?Location: Buttocks  ?Location Orientation: Right  ?Staging: Stage 1 -  Intact skin with non-blanchable redness of a localized area usually over a bony prominence.  ?Wound Description (Comments):   ?Present on Admission: Yes  ? ? ?DVT prophylaxis: SCD's Start: 12/27/21 0843 ?SCD's Start: 12/20/21 1327 ?Code Status:   Code Status: Full Code ?Family Communication: plan of care discussed with patient at bedside. ?Patient status is: Inpatient because of ongoing management of sepsis, postop ?Level of care: Stepdown  ? ?Dispo: The patient is from: Home ?           Anticipated disposition: CIR once stable ? ?Mobility Assessment (last 72 hours)   ? ? Mobility Assessment   ? ? [place] Name 12/28/21 1528 12/27/21 0800 12/26/21 2000  ?  ?  ? Does patient have an order for bedrest or is patient medically unstable -- Yes- Bedfast (Level 1) - Complete Yes- Bedfast (Level 1) - Complete    ? What is the highest level of mobility based on the progressive mobility assessment? Level 2 (Chair[place]) - Balance while sitting on edge of bed and cannot stand -- Level 1 (Bedfast) - Unable to balance while sitting on edge of bed    ? Is the above level different from baseline mobility prior to current illness? -- -- Yes - Recommend PT order     ? ?  ?  ? ?  ?  ? ?Objective: ?Vitals last 24 hrs: ?Vitals:  ? 12/29/21 0400 12/29/21 0500 12/29/21 0739 12/29/21 0800  ?BP: (!) 152/50 (!) 145/48    ?Pulse: 75 77    ?Resp: 18 17    ?Temp: 98.4 ?F (36.9 ?C)   98.3 ?F (36.8 ?C)  ?TempSrc: Axillary   Oral  ?SpO2: 97% 97% 97%   ?Weight:  100.3 kg    ?Height:      ? ?Weight change:  ? ?Physical Examination: ?General exam: alert awake oriented, pleasant, obese,older than stated age, weak appearing. ?HEENT:Oral mucosa moist, Ear/Nose WNL grossly, dentition normal. ?Respiratory system: bilaterally diminished BS, no use of accessory muscle ?Cardiovascular system: S1 & S2 +, No JVD. ?Gastrointestinal system: Abdomen soft,NT,ND, BS+ ?Nervous System:Alert, awake, moving extremities and grossly nonfocal ?Extremities: Right AKA stump with dressing in place,distal peripheral pulses palpable.  ?Skin: No rashes,no icterus. ?MSK: Normal muscle bulk,tone, power ? ?Medications reviewed:  ?Scheduled Meds: ? (feeding supplement) PROSource Plus  30 mL Oral BID BM  ? sodium chloride   Intravenous Once  ? sodium chloride   Intravenous Once  ? arformoterol  15 mcg Nebulization BID  ? vitamin C  1,000 mg Oral Daily  ? bictegravir-emtricitabine-tenofovir AF  1 tablet Oral Daily  ? budesonide (PULMICORT) nebulizer solution  0.5 mg Nebulization BID  ? carvedilol  12.5 mg Oral BID WC  ? chlorhexidine  15 mL Mouth Rinse BID  ? Chlorhexidine Gluconate Cloth  6 each Topical Daily  ? diltiazem  90 mg Oral Q6H  ? docusate sodium  100 mg Oral Daily  ? famotidine  20 mg Oral Daily  ? feeding supplement  237 mL Oral BID BM  ? hydrALAZINE  25 mg Oral TID  ? insulin aspart  0-15 Units Subcutaneous TID WC  ? insulin aspart  0-5 Units Subcutaneous QHS  ? mouth rinse  15 mL Mouth Rinse q12n4p  ? multivitamin with minerals  1 tablet Oral Daily  ? nutrition supplement (JUVEN)  1 packet Oral BID BM  ? pantoprazole  40 mg Oral Daily  ? polyethylene glycol  17 g Oral Daily  ? predniSONE  20 mg Oral Q breakfast   ? Followed by  ? [START ON 12/31/2021] predniSONE  10 mg Oral Q breakfast  ? QUEtiapine  25 mg Oral QHS  ? revefenacin  175 mcg Nebulization Daily  ? sodium chloride flush  10-40 mL Intracatheter Q12H  ? spironolac

## 2021-12-29 NOTE — Evaluation (Signed)
Occupational Therapy Evaluation ?Patient Details ?Name: Shannon [name] ?MRN: [id number] ?DOB: 01/08/1956 ?Today's Date: 12/29/2021 ? ? ?History of Present Illness Shannon [name] is a 66 y.o. woman with metastatic non-small cell lung cancer, asthma/COPD, GERD, HTN, and HIV who was admitted 12/17/21  due to right lower extremity wound  S/P irrigation and debridement of right foot and calf wounds on 12/17/21 .  On 4/28 she developed swelling of her right upper extremity and went for incision and drainage morning of 4/29 without overt signs of necrotizing fasciitis. She developed progressive mental status changes 4/28 to 4/29 with obtundation. She was transferred to the ICU and PCCM consulted for hypercapnic respiratory failure.  Intubated 4/29 and extubated 12/22/21. Pt underwent Rt AKA on 12/26/21.  ? ?Clinical Impression ?  ?Patient is a 66 year old woman who was ordered for re evaluation on this date s/p R AKA. Patient was noted to have had a significant decline in ability to participate in ADLs.  Patient was noted to be increasingly confused, increased pain, decreased functional activity tolerance and endurance impacting participation in [place]. Patients evaluation was limited to bed level with patients increased pain and decreased ability to follow one step commands. Patient would continue to benefit from skilled OT services at this time while admitted and after d/c to address noted deficits in order to improve overall safety and independence in ADLs.  ? ?   ? ?Recommendations for follow up therapy are one component of a multi-disciplinary discharge planning process, led by the attending physician.  Recommendations may be updated based on patient status, additional functional criteria and insurance authorization.  ? ?Follow Up Recommendations ? Acute inpatient rehab (3hours/day)  ?  ?Assistance Recommended at Discharge Frequent or constant Supervision/Assistance  ?Patient can return home with the following Assistance with  cooking/housework;Direct supervision/assist for medications management;Direct supervision/assist for financial management;Assist for transportation;Help with stairs or ramp for entrance;Two people to help with walking and/or transfers;Two people to help with bathing/dressing/bathroom ? ?  ?Functional Status Assessment ?    ?Equipment Recommendations ? BSC/3in1  ?  ?Recommendations for Other Services   ? ? ?  ?Precautions / Restrictions Precautions ?Precautions: Fall ?Precaution Comments: Rt AKA, wound vac, flexiseal, Rt UE I&D dressing in place. ?Restrictions ?Weight Bearing Restrictions: Yes ?RLE Weight Bearing: Non weight bearing ?Other Position/Activity Restrictions: Rt AKA  ? ?  ? ?Mobility Bed Mobility ?Overal bed mobility: Needs Assistance ?Bed Mobility: Rolling ?Rolling: +2 for physical assistance, +2 for safety/equipment, Total assist ?  ?  ?  ?  ?General bed mobility comments: multimodal cues to reach for rails, assist with legs to facilitate rolling to get cleaned up. ?  ? ?Transfers ?  ?  ?  ?  ?  ?  ?  ?  ?  ?  ?  ? ?  ?Balance   ?  ?  ?  ?  ?  ?  ?  ?  ?  ?  ?  ?  ?  ?  ?  ?  ?  ?  ?   ? ?ADL either performed or assessed with clinical judgement  ? ?ADL Overall ADL's : Needs assistance/impaired ?  ?  ?  ?  ?  ?  ?  ?  ?  ?  ?  ?  ?  ?Toilet Transfer Details (indicate cue type and reason): Deferred, patient having too much pain in R leg to attempt sitting EOB ?Toileting- Clothing Manipulation and Hygiene: Total assistance;Bed level ?Toileting - Clothing  Manipulation Details (indicate cue type and reason): with max A for rolling to each side with increased multimodal cues for rolling tasks. patient was easily distracted and anxious with all movements fearful of pain. ?  ?  ?  ?   ? ? ? ?Vision   ?   ?   ?Perception   ?  ?Praxis   ?  ? ?Pertinent Vitals/Pain Pain Assessment ?Pain Assessment: Faces ?Faces Pain Scale: Hurts even more ?Pain Location: RLE's and generalized pain with mobility, ?Pain Descriptors  / Indicators: Discomfort, Grimacing, Guarding, Moaning ?Pain Intervention(s): Monitored during session, Limited activity within patient's tolerance  ? ? ? ?Hand Dominance   ?  ?Extremity/Trunk Assessment   ?  ?  ?  ?  ?  ?Communication   ?  ?Cognition Arousal/Alertness: Awake/alert ?Behavior During Therapy: Anxious ?Overall Cognitive Status: Impaired/Different from baseline ?Area of Impairment: Orientation, Memory, Safety/judgement, Following commands, Awareness, Problem solving, Attention ?  ?  ?  ?  ?  ?  ?  ?  ?Orientation Level: Disoriented to, Time ?Current Attention Level: Sustained ?Memory: Decreased short-term memory ?Following Commands: Follows one step commands with increased time, Follows one step commands inconsistently ?Safety/Judgement: Decreased awareness of safety, Decreased awareness of deficits ?Awareness: Emergent ?Problem Solving: Decreased initiation, Difficulty sequencing, Requires verbal cues, Requires tactile cues ?General Comments: pt anxious and fearful of pain/discomfort, patient noted to be tangental at times as well ?  ?  ?General Comments    ? ?  ?Exercises   ?  ?Shoulder Instructions    ? ? ?Home Living   ?  ?  ?  ?  ?  ?  ?  ?  ?  ?  ?  ?  ?  ?  ?  ?  ?  ?  ? ?  ?Prior Functioning/Environment   ?  ?  ?  ?  ?  ?  ?  ?  ?  ? ?  ?  ?OT Problem List:   ?  ?   ?OT Treatment/Interventions:    ?  ?OT Goals(Current goals can be found in the care plan section) Acute Rehab OT Goals ?Patient Stated Goal: to have less pain ?OT Goal Formulation: Patient unable to participate in goal setting ?Time For Goal Achievement: 01/12/22 ?Potential to Achieve Goals: Fair  ?OT Frequency: Min 2X/week ?  ? ?Co-evaluation   ?Reason for Co-Treatment: For patient/therapist safety;Complexity of the patient's impairments (multi-system involvement) ?PT goals addressed during session: Mobility/safety with mobility ?OT goals addressed during session: ADL's and self-care ?  ? ?  ?AM-PAC OT "6 Clicks" Daily Activity      ?Outcome Measure Help from another person eating meals?: A Little ?Help from another person taking care of personal grooming?: A Little ?Help from another person toileting, which includes using toliet, bedpan, or urinal?: Total ?Help from another person bathing (including washing, rinsing, drying)?: Total ?Help from another person to put on and taking off regular upper body clothing?: Total ?Help from another person to put on and taking off regular lower body clothing?: Total ?6 Click Score: 10 ?  ?End of Session Equipment Utilized During Treatment: Oxygen ? ?Activity Tolerance: Patient limited by pain ?Patient left: in bed;with call bell/phone within reach;with bed alarm set ? ?OT Visit Diagnosis: Other abnormalities of gait and mobility (R26.89)  ?              ?Time: 1751-0258 ?OT Time Calculation (min): 24 min ?Charges:  OT General Charges ?$OT Visit: 1 Visit ?  OT Evaluation ?$OT Re-eval: 1 Re-eval ? ?Jilliam Bellmore OTR/L, MS ?Acute Rehabilitation Department ?Office# [phone] ?Pager# [phone] ? ? ?Feliz Beam Layana Konkel ?12/29/2021, 3:51 PM ?

## 2021-12-29 NOTE — Progress Notes (Signed)
Pt placed on bipap for the night. °

## 2021-12-29 NOTE — Progress Notes (Signed)
?  Inpatient Rehabilitation Admissions Coordinator  ? ?I await further progress with therapies to assist with planning most appropriate rehab venue. Currently not at  level to consider for Cir. I will follow. ? ?Danne Baxter, RN, MSN ?Rehab Admissions Coordinator ?[phone] ?12/29/2021 2:30 PM ? ?

## 2021-12-30 ENCOUNTER — Inpatient Hospital Stay: Payer: Medicare Other | Admitting: Internal Medicine

## 2021-12-30 ENCOUNTER — Encounter: Payer: Medicare Other | Admitting: Internal Medicine

## 2021-12-30 DIAGNOSIS — L02611 Cutaneous abscess of right foot: Secondary | ICD-10-CM | POA: Diagnosis not present

## 2021-12-30 LAB — CBC WITH DIFFERENTIAL/PLATELET
Abs Immature Granulocytes: 0.2 10*3/uL — ABNORMAL HIGH (ref 0.00–0.07)
Basophils Absolute: 0 10*3/uL (ref 0.0–0.1)
Basophils Relative: 0 %
Eosinophils Absolute: 0 10*3/uL (ref 0.0–0.5)
Eosinophils Relative: 1 %
HCT: 25.9 % — ABNORMAL LOW (ref 36.0–46.0)
Hemoglobin: 8.2 g/dL — ABNORMAL LOW (ref 12.0–15.0)
Immature Granulocytes: 4 %
Lymphocytes Relative: 9 %
Lymphs Abs: 0.5 10*3/uL — ABNORMAL LOW (ref 0.7–4.0)
MCH: 31.7 pg (ref 26.0–34.0)
MCHC: 31.7 g/dL (ref 30.0–36.0)
MCV: 100 fL (ref 80.0–100.0)
Monocytes Absolute: 0.3 10*3/uL (ref 0.1–1.0)
Monocytes Relative: 7 %
Neutro Abs: 3.9 10*3/uL (ref 1.7–7.7)
Neutrophils Relative %: 79 %
Platelets: 148 10*3/uL — ABNORMAL LOW (ref 150–400)
RBC: 2.59 MIL/uL — ABNORMAL LOW (ref 3.87–5.11)
RDW: 16.7 % — ABNORMAL HIGH (ref 11.5–15.5)
WBC: 5 10*3/uL (ref 4.0–10.5)
nRBC: 0.8 % — ABNORMAL HIGH (ref 0.0–0.2)

## 2021-12-30 LAB — SURGICAL PATHOLOGY

## 2021-12-30 LAB — BPAM RBC
Blood Product Expiration Date: 202306012359
Blood Product Expiration Date: 202306012359
ISSUE DATE / TIME: 202305070909
ISSUE DATE / TIME: 202305081152
Unit Type and Rh: 5100
Unit Type and Rh: 5100

## 2021-12-30 LAB — GLUCOSE, CAPILLARY
Glucose-Capillary: 106 mg/dL — ABNORMAL HIGH (ref 70–99)
Glucose-Capillary: 200 mg/dL — ABNORMAL HIGH (ref 70–99)
Glucose-Capillary: 165 mg/dL — ABNORMAL HIGH (ref 70–99)
Glucose-Capillary: 173 mg/dL — ABNORMAL HIGH (ref 70–99)

## 2021-12-30 LAB — TYPE AND SCREEN
ABO/RH(D): O POS
Antibody Screen: NEGATIVE
Unit division: 0
Unit division: 0

## 2021-12-30 LAB — BASIC METABOLIC PANEL
Anion gap: 5 (ref 5–15)
BUN: 36 mg/dL — ABNORMAL HIGH (ref 8–23)
CO2: 36 mmol/L — ABNORMAL HIGH (ref 22–32)
Calcium: 9.9 mg/dL (ref 8.9–10.3)
Chloride: 103 mmol/L (ref 98–111)
Creatinine, Ser: 0.97 mg/dL (ref 0.44–1.00)
GFR, Estimated: >60 mL/min (ref >60)
Glucose, Bld: 106 mg/dL — ABNORMAL HIGH (ref 70–99)
Potassium: 3.9 mmol/L (ref 3.5–5.1)
Sodium: 144 mmol/L (ref 135–145)

## 2021-12-30 LAB — MAGNESIUM: Magnesium: 1.9 mg/dL (ref 1.7–2.4)

## 2021-12-30 MED ORDER — DILTIAZEM HCL ER COATED BEADS 180 MG PO CP24
360.0000 mg | ORAL_CAPSULE | Freq: Every day | ORAL | Status: DC
  Administered 2021-12-31 – 2022-01-14 (×13): 360 mg via ORAL
  Filled 2021-12-30 (×15): qty 2

## 2021-12-30 NOTE — Progress Notes (Addendum)
?PROGRESS NOTE ?Shannon [name]  YHC:[id number] DOB: [birth year] DOA: 12/17/2021 ?PCP: [name], MD  ? ?Brief Narrative/Hospital Course: ?66 year old female with a history of metastatic adenocarcinoma of lung, COPD, HIV, was admitted to the hospital with right lower extremity wound, necrotizing fascitis,seen by orthopedics and underwent incision and drainage.  He was noted to have fungemia.  Infectious disease following for antibiotics/antifungals. Overall, her condition worsened with worsening mental status and respiratory status.She required intubation and transfer to ICU.  She was treated for COPD exacerbation and since then, respiratory status has stabilized and she was extubated on 5/1.  Orthopedics following for gas gangrene/necrotizing fasciitis of her right lower extremity and plans are for amputation, underwent Rt AKA at [facility] 12/26/21. Post op with anemia needing prbc transfusions x2. PTOT working and advised CIR ?  ?Subjective: ?Seen and examined this morning.  No new complaints ?Overnight no fever, hemoglobin stable and improved 8.2 ?Resting comfortably ? ?Assessment and Plan: ?Principal Problem: ?  Abscess of right foot ?Active Problems: ?  HIV disease ([place]) ?  Uncontrolled hypertension ?  Morbid (severe) obesity due to excess calories ([place]) ?  Atrial flutter ([place]) ?  Chronic respiratory failure with hypoxia (HCC) ?  Chronic anticoagulation ?  (HFpEF) heart failure with preserved ejection fraction ([place]) ?  Adenocarcinoma of lung ([place]) ?  Abscess of foot ?  Necrotizing fasciitis ([place]) ?  Pressure injury of skin ?  Fungemia ?  Acute respiratory failure with hypercapnia (HCC) ?  Macrocytic anemia ?  ?Necrotizing fasciitis and abscess RLE ?Sepsis w/ Candididemia and Klebsiella soft tissue infection  RLE of right lower extremity right upper extremity: S/P I&D 12/20/21 and OR culture positive with Klebsiella, Proteus and Enterococcus. Dr Sharol Given following s/p Rt AKA 5/5. Candidemia likely due to wound infection  translocation.Per ID okay to hold off TEE.I spoke with Dr. Katy Fitch 5/8-from ophthalmology who will do ophthalmology exam exam in house.  Completed Unasyn.  Culture negative from 5/1 -completed Diflucan through 5/15.  ID signed off.  Per Dr. Sharol Given okay to discontinue VAC at the time of discharge, no drainage in the wound VAC canister. ? ?Acute on chronic respiratory failure with hypoxia and hypercapnia: ?Acute COPD exacerbation: ?Initially needing intubation due to AMS on BiPAP. Off vent 5/1.  Respiratory status is stable continue 2 L nasal: Daytime, BiPAP bedtime and as needed,tapering off prednisone, continue Pulmicort/Brovana, nebulizer, supplemental oxygen , respiratory status stable. Encourage PT OT, IS. ? ?Right forearm wound-had I and D done on 4/29. Stitch in place and on daily dressing.   ? ?Metastatic adenocarcinoma of the lung to bone: Follow-up with oncology outpatient. ?  ?Acute metabolic encephalopathy: Multifactorial and also due to hypercapnea.on Seroquel at bedtime.  Mental status at baseline.   ?  ?AKI on CKD stage IIIa: Renal function is stable at 1 ?Recent Labs  ?Lab 12/24/21 ?5176 12/26/21 ?1607 12/28/21 ?3710 12/29/21 ?0406 12/30/21 ?6269  ?BUN 45* 41* 36* 41* 36*  ?CREATININE 1.31* 1.11* 0.93 1.00 0.97  ?Chronic diastolic CHF ?Essential hypertension: ?BP was high-Cardizem was increased from 60> 90 gm q6h 12/25/21> change long acting 360 mg, cont aldactone and metoprolol changed Coreg and titrate  as needed.  Overall stable BP and volume status compensated ?  ?Atrial flutter: Rate controlled, continue current readjusted Cardizem and Coreg. Holding Xarelto for now, once H&H is stable hopefully can resume it.  It was discussed with Dr. Sharol Given 5/7 per Dr Andria Frames.  If hemoglobin remains stable resume Xarelto tomorrow. ?  ?HIV disease: Continue  home regimen-Biktarvy, ID following ?  ?Macrocytic anemia ?Acute on chronic anemia likely postop ABLA: ?Multifactorial -also with anemia of chronic disease.   Stable-high B12 level, normal folic acid.so far total 2 units prbc transfused hemoglobin is stable.   ?Recent Labs  ?Lab 12/28/21 ?0412 12/28/21 ?1620 12/29/21 ?0406 12/29/21 ?1738 12/30/21 ?4765  ?HGB 6.7* 8.1* 7.0* 8.2* 8.2*  ?HCT 22.1* 24.8* 22.2* 25.4* 25.9*  ?  ?Type 2 diabetes mellitus: Borderline HbA1c 7.3.  Blood sugar borderline controlled, continue SSI. ?Recent Labs  ?Lab 12/29/21 ?0822 12/29/21 ?1228 12/29/21 ?1632 12/29/21 ?2131 12/30/21 ?0736  ?GLUCAP 115* 210* 231* 171* 106*  ?  ?Class II Obesity:Patient's Body mass index is 37.92 kg/m?. : Will benefit with PCP follow-up, weight loss  healthy lifestyle and outpatient sleep evaluation. ? ?Pressure ulcer right buttock stage I POA- as below ?Pressure Injury 12/20/21 Buttocks Right Stage 1 -  Intact skin with non-blanchable redness of a localized area usually over a bony prominence. (Active)  ?12/20/21 1600  ?Location: Buttocks  ?Location Orientation: Right  ?Staging: Stage 1 -  Intact skin with non-blanchable redness of a localized area usually over a bony prominence.  ?Wound Description (Comments):   ?Present on Admission: Yes  ? ? ?DVT prophylaxis: SCD's Start: 12/27/21 0843 ?SCD's Start: 12/20/21 1327 ?Code Status:   Code Status: Full Code ?Family Communication: plan of care discussed with patient at bedside. ?Patient status is: Inpatient because of ongoing management of sepsis, postop ?Level of care: Telemetry > downgrade to telemetry ? ?Dispo: The patient is from: Home ?           Anticipated disposition: CIR once bed available.  TOC consulted ? ?Mobility Assessment (last 72 hours)   ? ? Mobility Assessment   ? ? [place] Name 12/29/21 1546 12/28/21 1528  ?  ?  ?  ? What is the highest level of mobility based on the progressive mobility assessment? Level 2 ([place]) - Balance while sitting on edge of bed and cannot stand Level 2 ([place]) - Balance while sitting on edge of bed and cannot stand     ? ?  ?  ? ?  ?  ? ?Objective: ?Vitals last 24  hrs: ?Vitals:  ? 12/30/21 0730 12/30/21 0757 12/30/21 0800 12/30/21 0929  ?BP:   (!) 169/61 (!) 151/59  ?Pulse:  88 85   ?Resp:   18   ?Temp:   98.3 ?F (36.8 ?C)   ?TempSrc:   Oral   ?SpO2: 96%  95%   ?Weight:      ?Height:      ? ?Weight change: -0.1 kg ? ?Physical Examination: ?General exam: AA0x3, morbidly obese, older than stated age, weak appearing. ?HEENT:Oral mucosa moist, Ear/Nose WNL grossly, dentition normal. ?Respiratory system: bilaterally diminished,no use of accessory muscle ?Cardiovascular system: S1 & S2 +, No JVD,. ?Gastrointestinal system: Abdomen soft,NT,ND, BS+ ?Nervous System:Alert, awake, moving extremities and grossly nonfocal ?Extremities: edema neg,distal peripheral pulses palpable.  ?Skin: No rashes,no icterus. ?MSK: rt aka-still with dressing intact w/ wound VAC, canister is empty ? ? ?Medications reviewed:  ?Scheduled Meds: ? (feeding supplement) PROSource Plus  30 mL Oral BID BM  ? arformoterol  15 mcg Nebulization BID  ? vitamin C  1,000 mg Oral Daily  ? bictegravir-emtricitabine-tenofovir AF  1 tablet Oral Daily  ? budesonide (PULMICORT) nebulizer solution  0.5 mg Nebulization BID  ? carvedilol  12.5 mg Oral BID WC  ? chlorhexidine  15 mL Mouth Rinse BID  ? Chlorhexidine Gluconate Cloth  6 each Topical Daily  ? [START ON 12/31/2021] diltiazem  360 mg Oral Daily  ? diltiazem  90 mg Oral Q6H  ? docusate sodium  100 mg Oral Daily  ? famotidine  20 mg Oral Daily  ? feeding supplement  237 mL Oral BID BM  ? fluconazole  400 mg Oral Daily  ? hydrALAZINE  25 mg Oral TID  ? insulin aspart  0-15 Units Subcutaneous TID WC  ? insulin aspart  0-5 Units Subcutaneous QHS  ? mouth rinse  15 mL Mouth Rinse q12n4p  ? multivitamin with minerals  1 tablet Oral Daily  ? nutrition supplement (JUVEN)  1 packet Oral BID BM  ? pantoprazole  40 mg Oral Daily  ? polyethylene glycol  17 g Oral Daily  ? [START ON 12/31/2021] predniSONE  10 mg Oral Q breakfast  ? QUEtiapine  25 mg Oral QHS  ? revefenacin  175 mcg  Nebulization Daily  ? sodium chloride flush  10-40 mL Intracatheter Q12H  ? spironolactone  25 mg Oral Daily  ? zinc sulfate  220 mg Oral Daily  ? ?Continuous Infusions: ? sodium chloride 0 mL/hr at 12/27/21 0426  ? mag

## 2021-12-30 NOTE — Consult Note (Signed)
Ophthalmology Initial Consult Note ? ?Shannon [name], 66 y.o. female ?Date of Service:  @TODAYSDATE @ ? ?Requesting physician: Shannon Pert, MD ? ?Information Obtained from: Patient ?Chief Complaint:  Fungemia - DFE to r/o ocular involvement ? ?HPI/Discussion:  Shannon [name] is a 66 y.o. female who is currently at [facility] with fungemia. I was asked to look for signs of ocular involvement. The patient seems to be a poor historian and not very good at following commands. Denies any previous eye surgeries but seems confused. ? ?Past Ocular Hx:  Difficult to ascertain ?Ocular Meds:  None known ?Family ocular history: None pertinent ? ?Past Medical History:  ?Diagnosis Date  ? Anemia   ? Anxiety   ? HX PANIC ATTACKS  ? Arthritis   ? "starting to; in my hands" (07/09/2015)  ? Asthma   ? Atrial fibrillation ([place])   ? Atrial flutter, paroxysmal ([place])   ? Bloated abdomen   ? CFS (chronic fatigue syndrome)   ? Chewing difficulty   ? Chronic asthma with acute exacerbation   ? "I have chronic asthma all the time; sometimes exacerbations" (07/09/2015)  ? Chronic lower back pain   ? COPD (chronic obstructive pulmonary disease) ([place])   ? Cyst of right kidney   ? "3 of them; dx'd in ~ 01/2015"  ? Dyspnea   ? GERD (gastroesophageal reflux disease)   ? Heart murmur   ? History of blood transfusion   ? "related to my brain surgery I think"  ? History of pulmonary embolism 07/09/2015  ? History of radiation therapy 04/09/20-04/22/20  ? SBRT Left Lung, Dr. Gery Pray  ? History of radiation therapy   ? lumbar spine 10/08/2021-10/28/2021  Dr Gery Pray  ? HIV disease ([place])   ? Hyperlipidemia   ? Hypertension   ? Leg edema   ? Lipodystrophy   ? Mild CAD 2013  ? Multiple thyroid nodules   ? Osteopenia   ? Palpitations   ? Pneumonia 07/09/2015  ? Shingles   ? Vitamin B 12 deficiency   ? Vitamin D deficiency   ? ?Past Surgical History:  ?Procedure Laterality Date  ? ABDOMINAL HYSTERECTOMY    ? "robotic laparosopic"  ? AMPUTATION Right 12/26/2021  ?  Procedure: AMPUTATION ABOVE KNEE;  Surgeon: Newt Minion, MD;  Location: [place];  Service: Orthopedics;  Laterality: Right;  ? BRAIN SURGERY  1974  ? "brain tumor; benign; on top of my brain; got a plate in there"  ? BRAIN SURGERY    ? age 17- -"Tumor pushing my skullout"  ? BRONCHIAL BIOPSY  02/13/2020  ? Procedure: BRONCHIAL BIOPSIES;  Surgeon: [name], MD;  Location: [facility];  Service: Pulmonary;;  ? BRONCHIAL BRUSHINGS  02/13/2020  ? Procedure: BRONCHIAL BRUSHINGS;  Surgeon: [name], MD;  Location: [facility] ENDOSCOPY;  Service: Pulmonary;;  ? BRONCHIAL NEEDLE ASPIRATION BIOPSY  02/13/2020  ? Procedure: BRONCHIAL NEEDLE ASPIRATION BIOPSIES;  Surgeon: [name], MD;  Location: [facility] ENDOSCOPY;  Service: Pulmonary;;  ? CARDIAC CATHETERIZATION    ? FIDUCIAL MARKER PLACEMENT  02/13/2020  ? Procedure: FIDUCIAL MARKER PLACEMENT;  Surgeon: [name], MD;  Location: [facility] ENDOSCOPY;  Service: Pulmonary;;  ? I & D EXTREMITY Right 12/20/2021  ? Procedure: IRRIGATION AND DEBRIDEMENT RT. FOREARM;  Surgeon: Iran Planas, MD;  Location: [facility];  Service: Orthopedics;  Laterality: Right;  ? IRRIGATION AND DEBRIDEMENT FOOT Right 12/17/2021  ? Procedure: IRRIGATION AND DEBRIDEMENT, RIGHT FOOT AND CALF;  Surgeon: Armond Hang,  MD;  Location: [facility] ORS;  Service: Orthopedics;  Laterality: Right;  ? TONSILLECTOMY AND ADENOIDECTOMY    ? VIDEO BRONCHOSCOPY WITH ENDOBRONCHIAL NAVIGATION N/A 02/13/2020  ? Procedure: VIDEO BRONCHOSCOPY WITH ENDOBRONCHIAL NAVIGATION;  Surgeon: [name], MD;  Location: [facility] ENDOSCOPY;  Service: Pulmonary;  Laterality: N/A;  ? ? ?Prior to Admission Meds: ?Medications Prior to Admission  ?Medication Sig Dispense Refill Last Dose  ? A&D OINT Apply 1 application. topically daily as needed (rash).   12/17/2021  ? albuterol (PROVENTIL) (2.5 MG/3ML) 0.083% nebulizer solution USE 1 VIAL VIA NEBULIZER EVERY 4 HOURS AS NEEDED FOR WHEEZING OR SHORTNESS OF BREATH (Patient taking differently:  Take 2.5 mg by nebulization every 6 (six) hours as needed for shortness of breath.) 75 mL 2 12/16/2021  ? amoxicillin (AMOXIL) 500 MG tablet Take 1 tablet (500 mg total) by mouth 2 (two) times daily. 14 tablet 0 12/16/2021  ? Ascorbic Acid (VITAMIN C) 1000 MG tablet Take 1,000 mg by mouth every other day.    Past Week  ? b complex vitamins tablet Take 1 tablet by mouth daily.   Past Week  ? BIKTARVY 50-200-25 MG TABS tablet TAKE 1 TABLET BY MOUTH DAILY 30 tablet 5 12/15/2021  ? BIOTIN PO Take 1 tablet by mouth every other day.   Past Week  ? Calcium Citrate-Vitamin D (CALCIUM + D PO) Take 1 tablet by mouth every other day.   Past Week  ? CALCIUM PO Take 1 tablet by mouth daily.   Past Week  ? cyanocobalamin (,VITAMIN B-12,) 1000 MCG/ML injection ADMINISTER 1 ML(1000 MCG) IN THE MUSCLE EVERY 30 DAYS (Patient taking differently: Inject 1,000 mcg into the muscle every 30 (thirty) days.) 3 mL 1 10/28/2021  ? dexamethasone (DECADRON) 4 MG tablet Take 1 tablet (4 mg total) by mouth 2 (two) times daily with a meal. 30 tablet 0 12/16/2021  ? diltiazem (CARDIZEM CD) 240 MG 24 hr capsule TAKE 1 CAPSULE(240 MG) BY MOUTH DAILY (Patient taking differently: Take 240 mg by mouth daily.) 90 capsule 3 12/16/2021  ? famotidine (PEPCID) 40 MG tablet Take 1 tablet (40 mg total) by mouth daily. 30 tablet 11 Past Month  ? magnesium hydroxide (MILK OF MAGNESIA) 400 MG/5ML suspension Take 15 mLs by mouth daily as needed for mild constipation.   Past Week  ? Melatonin 5 MG TABS Take 5 mg by mouth at bedtime as needed (for sleep).   Past Week  ? Naphazoline HCl (CLEAR EYES OP) Place 1 drop into both eyes daily as needed (redness).   Past Week  ? neomycin-bacitracin-polymyxin (NEOSPORIN) ointment Apply 1 application. topically daily as needed for wound care.   Past Week  ? Olopatadine HCl (PATADAY OP) Place 1 drop into both eyes daily as needed (burning).   Past Week  ? Oxycodone HCl 10 MG TABS Take 10 mg by mouth every 6 (six) hours as needed  (breakthrough pain).   12/17/2021  ? OXYGEN Inhale 1.5-2 L into the lungs See admin instructions. Uses at bedtime and through the day as needed   12/17/2021  ? Phenylephrine-Cocoa Butter (PREPARATION H RE) Place 1 application. rectally daily as needed (hemorrhoids).   unknown  ? Polyethyl Glycol-Propyl Glycol (SYSTANE OP) Place 1 drop into both eyes daily as needed (dry eyes).   Past Week  ? potassium chloride (KLOR-CON) 10 MEQ tablet TAKE 4 TABLETS BY MOUTH DAILY FOR 5 DAYS ONLY (Patient taking differently: Take 40 mEq by mouth daily.) 30 tablet 0 12/16/2021  ?  spironolactone (ALDACTONE) 25 MG tablet Take 1 tablet (25 mg total) by mouth daily. 90 tablet 0 Past Week  ? XARELTO 20 MG TABS tablet TAKE 1 TABLET(20 MG) BY MOUTH DAILY (Patient taking differently: 20 mg daily.) 90 tablet 0 12/16/2021 at 1100  ? furosemide (LASIX) 20 MG tablet TAKE 2 TABLETS BY MOUTH TWICE A DAY FOR 5 DAYS THEN AS DIRECTED BY PHYSICIAN (Patient not taking: Reported on 12/17/2021) 60 tablet 0 Not Taking  ? Misc. Devices (PULSE OXIMETER FOR FINGER) MISC 1 Units by Does not apply route as needed. 1 each 0   ? morphine (MS CONTIN) 15 MG 12 hr tablet Take 1 tablet by mouth every 12 hours. (Patient not taking: Reported on 12/17/2021) 30 tablet 0 Not Taking  ? Mouthwashes (BIOTENE DRY MOUTH GENTLE) LIQD Use as directed 1 Dose in the mouth or throat 2 (two) times daily as needed (dry mouth). (Patient not taking: Reported on 12/17/2021)   Not Taking  ? ondansetron (ZOFRAN) 4 MG tablet Take 1 tablet (4 mg total) by mouth every 8 (eight) hours as needed for nausea or vomiting. (Patient not taking: Reported on 11/27/2021) 20 tablet 0 Not Taking  ? SYRINGE/NEEDLE, DISP, 1 ML (B-D SYRINGE/NEEDLE 1CC/25GX5/8) 25G X 5/8" 1 ML MISC 1 Units by Does not apply route every 30 (thirty) days. 50 each 0   ? ? ?Inpatient Meds: ?@IPMEDS @ ? ?Allergies  ?Allergen Reactions  ? Lisinopril Swelling and Cough  ?  Face/throat swelling  ? Tree Extract Swelling and Other (See  Comments)  ?  Swelling to eyes  ? Augmentin [Amoxicillin-Pot Clavulanate] Other (See Comments)  ?  Headache, dizzy  ? Ciprofloxacin Hives  ? ?Social History  ? ?Tobacco Use  ? Smoking status: Former  ?  Types: Cig

## 2021-12-30 NOTE — TOC Progression Note (Signed)
Transition of Care (TOC) - Progression Note  ? ?Patient Details  ?Name: Shannon [name] ?MRN: [id number] ?Date of Birth: 03-29-1956 ? ?Transition of Care (TOC) CM/SW Contact  ?Sherie Don, LCSW ?Phone Number: ?12/30/2021, 12:12 PM ? ?Clinical Narrative: CSW notified by CIR that patient is not a candidate for the program. Patient was faxed out last week for SNF and has bed offers. ? ?CSW spoke with sister to provide bed offers, but she will be at the hospital in several hours. Sister aware that bed offers are at the nurses station on the 6th floor. ? ?Expected Discharge Plan: [place] ?Barriers to Discharge: Continued Medical Work up, SNF Pending bed offer ? ?Expected Discharge Plan and Services ?Expected Discharge Plan: [place] ?Discharge Planning Services: CM Consult ?Post Acute Care Choice: [place] ?Living arrangements for the past 2 months: Apartment ? ?Readmission Risk Interventions ? ?  12/22/2021  ?  9:44 AM  ?Readmission Risk Prevention Plan  ?Transportation Screening Complete  ?Medication Review Press photographer) Complete  ?[place] or Home Care Consult Complete  ?SW Recovery Care/Counseling Consult Complete  ?Palliative Care Screening Not Applicable  ?[place] Not Applicable  ? ?

## 2021-12-30 NOTE — Progress Notes (Signed)
Nutrition Follow-up ? ?DOCUMENTATION CODES:  ? ?Obesity unspecified ? ?INTERVENTION:  ?- continue Ensure Plus High Protein BID, 1 packet Juven BID, and 30 ml Prosource Plus BID. ? ? ?NUTRITION DIAGNOSIS:  ? ?Increased nutrient needs related to post-op healing, wound healing as evidenced by estimated needs. -ongoing ? ?GOAL:  ? ?Patient will meet greater than or equal to 90% of their needs -met on average ? ?MONITOR:  ? ?PO intake, Supplement acceptance, Labs, Weight trends, I & O's, Skin ? ?ASSESSMENT:  ? ?66 y.o. female with medical history significant of metastatic recurrent non-small cell lung cancer (01/2020) s/p curative radiotherapy (03/2020), with recurrence in February 2023, asthma, anxiety, COPD, GERD, hypertension, hyperlipidemia and HIV was brought in to the emergency department from [facility] due to worsening of the right lower extremity wound. Admitted for Abscess and necrotizing fasciitis of the right lower extremity and mild cellulitis of left lower extremity. ? ?POD #4 R AKA. ? ?No meal intakes documented since dinner on 5/2 until breakfast this AM. Able to talk with RN after visit to patient's room and RN shared that patient ate 100% of breakfast and consumed 1 packet of Juven and 30 ml Prosource Plus without issue.  ? ?Patient transferring out of 2W today.  ? ?Patient laying in bed with no visitors at bedside at the time of RD visit. Patient noted to have acute metabolic encephalopathy dx this admission and that mentation is now at bedside.  ? ?Patient with hx of HIV on biktarvy and hx of adenocarcinoma of the lung with mets to bone with plan for outpatient follow-up.  ? ?Discussed with patient the importance of nutrition and intake of oral nutrition supplements to aid in wound healing and maintenance of strength.  ? ?Weight has been stable over the past 6 days and consistent with weight on 4/26 and 4/27, despite R AKA on 5/5. She is noted to have mild pitting edema to RUE and non-pitting edema  to LUE and LLE.  ? ? ? ?Labs reviewed; CBGs: 106 and 200 mg/dl, BUN: 36 mg/dl. Serum vitamin A resulted: 75.5 (reference range: 22-69.5) ? ?Medications reviewed; 1000 mg ascorbic acid/day, 100 mg colace/day, 20 mg oral pepcid/day, sliding scale novolog, 2 g IV Mg sulfate x1 run 5/6, 1 tablet multivitamin with minerals/day, 40 mg oral protonix/day, 17 g miralax/day, deltasone taper, 25 mg aldactone/day, 220 mg zinc sulfate/day.  ? ? ?Diet Order:   ?Diet Order   ? ?       ?  Diet Carb Modified Fluid consistency: Thin; Room service appropriate? Yes  Diet effective now       ?  ? ?  ?  ? ?  ? ? ?EDUCATION NEEDS:  ? ?Education needs have been addressed ? ?Skin:  Skin Assessment: Skin Integrity Issues: ?Skin Integrity Issues:: Stage I, Other (Comment), Incisions ?Stage I: R buttocks ?Incisions: RLE for AKA (5/5) ?Other: MASD to bilateral buttocks ? ?Last BM:  5/8 (type 6 x2, both medium amounts) ? ?Height:  ? ?Ht Readings from Last 1 Encounters:  ?12/20/21 5' 4"  (1.626 m)  ? ? ?Weight:  ? ?Wt Readings from Last 1 Encounters:  ?12/30/21 100.2 kg  ? ? ? ?BMI:  Body mass index is 37.92 kg/m?. ? ?Estimated Nutritional Needs:  ?Kcal:  2400-2600 kcal ?Protein:  120-140 grams ?Fluid:  >/= 2.3 L/day ? ? ? ? ?Jarome Matin, MS, RD, LDN ?Registered Dietitian II ?Inpatient Clinical Nutrition ?RD pager # and on-call/weekend pager # available in [place]  ? ?

## 2021-12-31 ENCOUNTER — Telehealth: Payer: Medicare Other

## 2021-12-31 ENCOUNTER — Telehealth: Payer: Self-pay | Admitting: Medical Oncology

## 2021-12-31 DIAGNOSIS — A480 Gas gangrene: Secondary | ICD-10-CM | POA: Diagnosis not present

## 2021-12-31 DIAGNOSIS — L02611 Cutaneous abscess of right foot: Secondary | ICD-10-CM | POA: Diagnosis not present

## 2021-12-31 DIAGNOSIS — L02619 Cutaneous abscess of unspecified foot: Secondary | ICD-10-CM | POA: Diagnosis not present

## 2021-12-31 DIAGNOSIS — J9602 Acute respiratory failure with hypercapnia: Secondary | ICD-10-CM | POA: Diagnosis not present

## 2021-12-31 LAB — GLUCOSE, CAPILLARY
Glucose-Capillary: 94 mg/dL (ref 70–99)
Glucose-Capillary: 109 mg/dL — ABNORMAL HIGH (ref 70–99)
Glucose-Capillary: 204 mg/dL — ABNORMAL HIGH (ref 70–99)
Glucose-Capillary: 243 mg/dL — ABNORMAL HIGH (ref 70–99)

## 2021-12-31 MED ORDER — OXYCODONE HCL 5 MG PO TABS
7.5000 mg | ORAL_TABLET | Freq: Four times a day (QID) | ORAL | Status: DC
  Administered 2021-12-31 – 2022-01-02 (×8): 7.5 mg via ORAL
  Filled 2021-12-31 (×8): qty 2

## 2021-12-31 MED ORDER — GABAPENTIN 600 MG PO TABS
300.0000 mg | ORAL_TABLET | Freq: Two times a day (BID) | ORAL | Status: DC
  Filled 2021-12-31: qty 0.5

## 2021-12-31 MED ORDER — OXYCODONE HCL 5 MG PO TABS: 2.5000 mg | ORAL_TABLET | Freq: Every day | ORAL | Status: DC

## 2021-12-31 MED ORDER — OXYCODONE HCL 5 MG PO TABS: 7.5000 mg | ORAL_TABLET | ORAL | Status: DC

## 2021-12-31 MED ORDER — GABAPENTIN 100 MG PO CAPS
200.0000 mg | ORAL_CAPSULE | Freq: Two times a day (BID) | ORAL | Status: DC
Start: 2021-12-31 — End: 2022-01-02
  Administered 2021-12-31 – 2022-01-02 (×5): 200 mg via ORAL
  Filled 2021-12-31 (×5): qty 2

## 2021-12-31 NOTE — Progress Notes (Signed)
@  0100 pt removed CPAP. Pt educated on it's necessity but adamant about not replacing it. [place] @3L  placed. SpO2 99%. Will continue to monitor. ?

## 2021-12-31 NOTE — Progress Notes (Signed)
?[facility] Progress Note ? ?Patient: Shannon [name]     ?AOZ:[id number]  ?DOA: 12/17/2021   ?PCP: [name], MD  ? ?  ?  ?Brief hospital course: ?Is a 66 year old female with metastatic lung adenocarcinoma, COPD, HIV, chronic diastolic heart failure, atrial flutter, diabetes mellitus and obesity who presented to the hospital with a right lower extremity abscess.  She was diagnosed with necrotizing fasciitis and underwent incision and drainage by orthopedic surgery.  She was later discovered to have fungemia.  Infectious disease was consulted and has been assisting with antibiotic and antifungal choices.  Her hospital course was complicated by respiratory failure and worsening mental status and she was intubated and transferred to the ICU to be treated for COPD exacerbation. ?She was extubated on 5/1.  She subsequently underwent a right AKA on 12/26/2021 for nonhealing necrotizing fasciitis with gangrene. ?Her mental status and COPD exacerbation have resolved. ?She has completed antibiotics and antifungals and is now awaiting placement. ? ?Subjective:  ?In 9/10 pain in right stump and right arm. Also has tingling in her stump and feelings of phantom limb.  ? ?Assessment and Plan: ?Principal Problem: ?  Abscess of right foot, necrotizing fasciitis ?- Status post I&D on 12/20/2021 with OR culture revealing Klebsiella Proteus and Enterococcus ?- Status post right AKA on 5/5 ?- Completed Unasyn ?-Wound VAC to be removed at time of discharge ?- start Neurontin and increase Oxycodone to 7.5 mg QID today to see if pain improves ? ? ?Active Problems: ?  Fungemia ?-Suspected to be as a result of the wound infection ?- Diflucan to be completed on 5/15 ?- ID has signed off ?- Ophthalmology eval completed on 5/9-no eye involvement noted ? ?  Acute respiratory failure with hypoxia and hypercapnia (HCC) ?COPD exacerbation ?- Failed BiPAP and required intubation ?- Exacerbation has resolved and she has been weaned off of  oxygen ?-We being treated with albuterol nebs as needed, Brovana twice daily, Pulmicort twice daily, prednisone taper, revefenacin neb daily ? ?Acute metabolic encephalopathy ?- Resolved ? ?  HIV disease ([place]) ?-Continue Biktarvy ?- CD4 count on 11/25/2021 was 143 ? ?  Uncontrolled hypertension ?  (HFpEF) heart failure with preserved ejection fraction ([place]) ?-Currently receiving carvedilol 12.5 mg twice daily, diltiazem 360 mg, hydralazine 25 mg 3 times daily, spironolactone 25 mg daily  ?-Current medications include metoprolol, labetalol and hydralazine IV ?- BP still elevated despite above medications ?-Heart rate ranges from 60s to 80s and therefore I will increase carvedilol ? ?  Morbid (severe) obesity due to excess calories ([place]) ?Body mass index is 39.39 kg/m?. ? ?  Atrial flutter ([place]) ?-Xarelto on hold due to acute blood loss postop ?- Rate controlled on Cardizem and Coreg ? ?Acute blood loss anemia with macrocytosis ?- Status post 1 unit of packed red blood cell transfusion on 5/7 and a second unit on 5/28 ?- Hemoglobin has been stable since ? ? Adenocarcinoma of lung ([place]), recurrent metastatic non-small cell lung cancer ?- Status post curative radiotherapy to left upper lobe lung nodules-8/21 ?- On surveillance with Dr. Earlie Server ?  ? ?  ?DVT prophylaxis:  SCD's Start: 12/27/21 0843 ?  ?  Code Status: Full Code  ?Consultants: ortho, ID, opthal, critical care ?Level of Care: Level of care: Telemetry ?Disposition Plan:  ?Status is: Inpatient ?Remains inpatient appropriate because: infection, awaiting SNF ? ?Objective: ?  ?Vitals:  ? 12/31/21 0107 12/31/21 0500 12/31/21 0515 12/31/21 0825  ?BP: (!) 151/70  (!) 154/71   ?  Pulse: 70  70   ?Resp:   18   ?Temp:   98 ?F (36.7 ?C)   ?TempSrc:   Oral   ?SpO2:   95% 96%  ?Weight:  104.1 kg    ?Height:      ? ?Filed Weights  ? 12/29/21 0500 12/30/21 0332 12/31/21 0500  ?Weight: 100.3 kg 100.2 kg 104.1 kg  ? ?Exam: ?General exam: Appears comfortable  ?HEENT: PERRLA, oral  mucosa moist, no sclera icterus or thrush ?Respiratory system: Clear to auscultation. Respiratory effort normal. ?Cardiovascular system: S1 & S2 heard, regular rate and rhythm ?Gastrointestinal system: Abdomen soft, non-tender, nondistended. Normal bowel sounds   ?Central nervous system: Alert and oriented. No focal neurological deficits. ?Extremities: No cyanosis, clubbing or edema- right AKA with wound vac ?Skin: extensive denuded skin on right forearm ?Psychiatry:  Mood & affect appropriate.   ? ?Imaging and lab data was personally reviewed ? ? ? CBC: ?Recent Labs  ?Lab 12/25/21 ?9678 12/26/21 ?0603 12/28/21 ?9381 12/28/21 ?1620 12/29/21 ?0406 12/29/21 ?1738 12/30/21 ?0175  ?WBC 7.2 8.4 5.9  --  6.2 7.0 5.0  ?NEUTROABS 6.2 7.0 4.9  --  5.0  --  3.9  ?HGB 8.6* 9.0* 6.7* 8.1* 7.0* 8.2* 8.2*  ?HCT 28.3* 28.5* 22.1* 24.8* 22.2* 25.4* 25.9*  ?MCV 104.0* 102.2* 104.2*  --  100.5* 100.0 100.0  ?PLT 115* 121* 108*  --  126* 137* 148*  ? ?Basic Metabolic Panel: ?Recent Labs  ?Lab 12/26/21 ?0603 12/28/21 ?1025 12/29/21 ?0406 12/30/21 ?8527  ?NA 142 141 144 144  ?K 4.4 4.2 3.9 3.9  ?CL 106 104 104 103  ?CO2 30 34* 36* 36*  ?GLUCOSE 140* 155* 143* 106*  ?BUN 41* 36* 41* 36*  ?CREATININE 1.11* 0.93 1.00 0.97  ?CALCIUM 10.3 9.8 9.9 9.9  ?MG  --  1.8 1.7 1.9  ? ?GFR: ?Estimated Creatinine Clearance: 66.2 mL/min (by C-G formula based on SCr of 0.97 mg/dL). ? ?Scheduled Meds: ? (feeding supplement) PROSource Plus  30 mL Oral BID BM  ? arformoterol  15 mcg Nebulization BID  ? vitamin C  1,000 mg Oral Daily  ? bictegravir-emtricitabine-tenofovir AF  1 tablet Oral Daily  ? budesonide (PULMICORT) nebulizer solution  0.5 mg Nebulization BID  ? carvedilol  12.5 mg Oral BID WC  ? chlorhexidine  15 mL Mouth Rinse BID  ? Chlorhexidine Gluconate Cloth  6 each Topical Daily  ? diltiazem  360 mg Oral Daily  ? docusate sodium  100 mg Oral Daily  ? famotidine  20 mg Oral Daily  ? feeding supplement  237 mL Oral BID BM  ? fluconazole  400 mg  Oral Daily  ? hydrALAZINE  25 mg Oral TID  ? insulin aspart  0-15 Units Subcutaneous TID WC  ? insulin aspart  0-5 Units Subcutaneous QHS  ? mouth rinse  15 mL Mouth Rinse q12n4p  ? multivitamin with minerals  1 tablet Oral Daily  ? nutrition supplement (JUVEN)  1 packet Oral BID BM  ? pantoprazole  40 mg Oral Daily  ? polyethylene glycol  17 g Oral Daily  ? predniSONE  10 mg Oral Q breakfast  ? QUEtiapine  25 mg Oral QHS  ? revefenacin  175 mcg Nebulization Daily  ? sodium chloride flush  10-40 mL Intracatheter Q12H  ? spironolactone  25 mg Oral Daily  ? zinc sulfate  220 mg Oral Daily  ? ?Continuous Infusions: ? sodium chloride 0 mL/hr at 12/27/21 0426  ? magnesium sulfate bolus  IVPB    ? ? ? LOS: 14 days  ? ?Author: ?Debbe Odea  ?12/31/2021 8:30 AM ?   ?

## 2021-12-31 NOTE — Progress Notes (Signed)
Occupational Therapy Treatment ?Patient Details ?Name: Shannon [name] ?MRN: [id number] ?DOB: Feb 15, 1956 ?Today's Date: 12/31/2021 ? ? ?History of present illness Shannon [name] is a 66 y.o. woman with metastatic non-small cell lung cancer, asthma/COPD, GERD, HTN, and HIV who was admitted 12/17/21  due to right lower extremity wound  S/P irrigation and debridement of right foot and calf wounds on 12/17/21 .  On 4/28 she developed swelling of her right upper extremity and went for incision and drainage morning of 4/29 without overt signs of necrotizing fasciitis. She developed progressive mental status changes 4/28 to 4/29 with obtundation. She was transferred to the ICU and PCCM consulted for hypercapnic respiratory failure.  Intubated 4/29 and extubated 12/22/21. Pt underwent Rt AKA on 12/26/21. ?  ?OT comments ? Co-treat with PT today to progress patient. Patient max x 2 to transfer to edge of bed. Performed seated grooming at edge of bed. Patient total assist for hair care - as patient using upper extremities to prop herself. She was eventually switched to just propping with LUE and then no hands. Patient educated on positioning of edematous RUE and exercises to perform to reduce edema while in bed. Patient verbalized understanding. Patient's cognition improved today - she was alert to self, place and grossly to situation. Recommend short term rehab at discharge to maximize physical abilities.   ? ?Recommendations for follow up therapy are one component of a multi-disciplinary discharge planning process, led by the attending physician.  Recommendations may be updated based on patient status, additional functional criteria and insurance authorization. ?   ?Follow Up Recommendations ? Skilled nursing-short term rehab (<3 hours/day)  ?  ?Assistance Recommended at Discharge Frequent or constant Supervision/Assistance  ?Patient can return home with the following ? Assistance with cooking/housework;Direct supervision/assist for  medications management;Direct supervision/assist for financial management;Assist for transportation;Help with stairs or ramp for entrance;Two people to help with walking and/or transfers;A lot of help with bathing/dressing/bathroom ?  ?Equipment Recommendations ? BSC/3in1 (bari)  ?  ?Recommendations for Other Services   ? ?  ?Precautions / Restrictions Precautions ?Precautions: Fall ?Precaution Comments: Rt AKA, wound vac, flexiseal, Rt UE I&D dressing in place. ?Required Braces or Orthoses: Splint/Cast ?Splint/Cast: Has a darco shoe in package in room. Orderd by ortho post I and D of right leg. ?Restrictions ?Weight Bearing Restrictions: Yes ?RLE Weight Bearing: Non weight bearing ?Other Position/Activity Restrictions: Rt AKA  ? ? ?  ? ?Mobility Bed Mobility ?  ?  ?  ?  ?  ?  ?  ?  ?  ? ?Transfers ?  ?  ?  ?  ?  ?  ?  ?  ?  ?  ?  ?  ?Balance   ?  ?  ?  ?  ?  ?  ?  ?  ?  ?  ?  ?  ?  ?  ?  ?  ?  ?  ?   ? ?ADL either performed or assessed with clinical judgement  ? ?ADL Overall ADL's : Needs assistance/impaired ?  ?  ?Grooming: Brushing hair;Sitting ?Grooming Details (indicate cue type and reason): total assist for hair care at edge of bed. Therapist had to apply product and brush hair as patient propping with UEs. Started out with both arms, then just left due to right arm tired, then both hands in lap. ?  ?  ?  ?  ?  ?  ?  ?  ?  ?  ?  ?  ?  ?  ?  ?  General ADL Comments: Patient max x 2 to transfer to edge of bed with use of bed pad to pivot her. Groomign task at edge of bed to work on sitting tolerance. She reported she was dizzy the whole time but not symptomatic. Total assist to return to supine. Patient able to assit with rolling with use of bed rails. ?  ? ?Extremity/Trunk Assessment   ?  ?  ?  ?  ?  ? ?Vision Patient Visual Report: No change from baseline ?  ?  ?Perception   ?  ?Praxis   ?  ? ?Cognition Arousal/Alertness: Awake/alert ?Behavior During Therapy: Anxious ?Overall Cognitive Status: Within Functional  Limits for tasks assessed ?  ?  ?  ?  ?  ?  ?  ?  ?  ?  ?  ?  ?  ?  ?  ?  ?General Comments: Improved cognition. Alert to self, hospital, Month. Able to state what surgery happened. ?  ?  ?   ?Exercises   ? ?  ?Shoulder Instructions   ? ? ?  ?General Comments    ? ? ?Pertinent Vitals/ Pain       Pain Assessment ?Pain Assessment: Faces ?Faces Pain Scale: Hurts little more ?Pain Location: generalized pain with mobility, RUE ?Pain Descriptors / Indicators: Discomfort, Grimacing, Guarding, Moaning ?Pain Intervention(s): Limited activity within patient's tolerance, Monitored during session, Repositioned ? ?Home Living   ?  ?  ?  ?  ?  ?  ?  ?  ?  ?  ?  ?  ?  ?  ?  ?  ?  ?  ? ?  ?Prior Functioning/Environment    ?  ?  ?  ?   ? ?Frequency ? Min 2X/week  ? ? ? ? ?  ?Progress Toward Goals ? ?OT Goals(current goals can now be found in the care plan section) ? Progress towards OT goals: Progressing toward goals ? ?Acute Rehab OT Goals ?Patient Stated Goal: get better ?OT Goal Formulation: With patient ?Time For Goal Achievement: 01/12/22 ?Potential to Achieve Goals: Good  ?Plan Discharge plan needs to be updated   ? ?Co-evaluation ? ? ? PT/OT/SLP Co-Evaluation/Treatment: Yes ?Reason for Co-Treatment: For patient/therapist safety;To address functional/ADL transfers ?PT goals addressed during session: Mobility/safety with mobility ?OT goals addressed during session: ADL's and self-care ?  ? ?  ?AM-PAC OT "6 Clicks" Daily Activity     ?Outcome Measure ? ? Help from another person eating meals?: A Little ?Help from another person taking care of personal grooming?: Total ?Help from another person toileting, which includes using toliet, bedpan, or urinal?: Total ?Help from another person bathing (including washing, rinsing, drying)?: Total ?Help from another person to put on and taking off regular upper body clothing?: Total ?Help from another person to put on and taking off regular lower body clothing?: Total ?6 Click Score: 8 ? ?   ?End of Session Equipment Utilized During Treatment: Oxygen ? ?OT Visit Diagnosis: Other abnormalities of gait and mobility (R26.89) ?  ?Activity Tolerance Patient tolerated treatment well ?  ?Patient Left in bed;with call bell/phone within reach;with bed alarm set ?  ?Nurse Communication Mobility status ?  ? ?   ? ?Time: 6384-6659 ?OT Time Calculation (min): 32 min ? ?Charges: OT General Charges ?$OT Visit: 1 Visit ?OT Treatments ?$Self Care/Home Management : 8-22 mins ? ?Kavon Valenza, OTR/L ?Acute [facility] Rehab Services  ?Office [phone] ?Pager: [phone]  ? ?Tacie Mccuistion L Demontay Grantham ?12/31/2021, 4:00 PM ?

## 2021-12-31 NOTE — Telephone Encounter (Signed)
Shannon [name] and her sister, Shannon [name], who is with pt, are waiting on her Guardant 360 results.  ? ?

## 2021-12-31 NOTE — Progress Notes (Signed)
Physical Therapy Treatment ?Patient Details ?Name: Shannon [name] ?MRN: [id number] ?DOB: 1955/11/19 ?Today's Date: 12/31/2021 ? ? ?History of Present Illness Shannon [name] is a 66 y.o. woman with metastatic non-small cell lung cancer, asthma/COPD, GERD, HTN, and HIV who was admitted 12/17/21  due to right lower extremity wound  S/P irrigation and debridement of right foot and calf wounds on 12/17/21 .  On 4/28 she developed swelling of her right upper extremity and went for incision and drainage morning of 4/29 without overt signs of necrotizing fasciitis. She developed progressive mental status changes 4/28 to 4/29 with obtundation. She was transferred to the ICU and PCCM consulted for hypercapnic respiratory failure.  Intubated 4/29 and extubated 12/22/21. Pt underwent Rt AKA on 12/26/21. ? ?  ?PT Comments  ? ? Pt seen with OT to progress pt. Pt required max assist +2 to transfer to EOB, spent session dangling EOB for OT tasks. Pt required total assist for propping upright in seated position, but then transitioned to LUE support and then no support, just dangling. Pt educated on the importance of generalized movement to maintain strength and range of motion, verbalized understanding. Pt completed LLE exercises with cuing. Cognition was improved over previous therapy visit, generally Aox3. Recommending SNF-level therapies upon discharge to promote improvement in functional limitations. We will continue to follow acutely.  ?   ?Recommendations for follow up therapy are one component of a multi-disciplinary discharge planning process, led by the attending physician.  Recommendations may be updated based on patient status, additional functional criteria and insurance authorization. ? ?Follow Up Recommendations ? Skilled nursing-short term rehab (<3 hours/day) ?  ?  ?Assistance Recommended at Discharge Frequent or constant Supervision/Assistance  ?Patient can return home with the following Two people to help with walking and/or  transfers;Assistance with cooking/housework;Assist for transportation;Help with stairs or ramp for entrance;Two people to help with bathing/dressing/bathroom;Direct supervision/assist for medications management ?  ?Equipment Recommendations ? Wheelchair cushion (measurements PT);Wheelchair (measurements PT)  ?  ?Recommendations for Other Services Rehab consult ? ? ?  ?Precautions / Restrictions Precautions ?Precautions: Fall ?Precaution Comments: Rt AKA, wound vac, flexiseal, Rt UE I&D dressing in place. ?Required Braces or Orthoses: Splint/Cast ?Splint/Cast: Has a darco shoe in package in room. Orderd by ortho post I and D of right leg. ?Restrictions ?Weight Bearing Restrictions: Yes ?RLE Weight Bearing: Non weight bearing ?Other Position/Activity Restrictions: Rt AKA  ?  ? ?Mobility ? Bed Mobility ?Overal bed mobility: Needs Assistance ?Bed Mobility: Rolling ?Rolling: +2 for physical assistance, +2 for safety/equipment, Total assist ?  ?Supine to sit: Max assist, +2 for physical assistance, +2 for safety/equipment, HOB elevated ?Sit to supine: +2 for physical assistance, +2 for safety/equipment, Max assist ?  ?General bed mobility comments: Pt max assist +2 for trunk elevation and bringing LLE off/on bed.  ?Transfers ?  ?  ?  ?  ?  ?  ?  ?  ?  ?General transfer comment: will need a maxisky ?  ? ?Ambulation/Gait ?  ?  ?  ?  ?  ?  ?  ?General Gait Details: not safe at present ? ? ?Stairs ?  ?  ?  ?  ?  ? ? ?Wheelchair Mobility ?  ? ?Modified Rankin (Stroke Patients Only) ?  ? ? ?  ?Balance Overall balance assessment: Needs assistance ?Sitting-balance support: Feet unsupported, Bilateral upper extremity supported ?Sitting balance-Leahy Scale: Poor ?Sitting balance - Comments: deferred ?  ?  ?  ?  ?  ?  ?  ?  ?  ?  ?  ?  ?  ?  ?  ?  ? ?  ?  Cognition Arousal/Alertness: Awake/alert ?Behavior During Therapy: Anxious ?Overall Cognitive Status: Within Functional Limits for tasks assessed ?Area of Impairment: Memory,  Safety/judgement, Following commands, Awareness, Problem solving, Attention ?  ?  ?  ?  ?  ?  ?  ?  ?  ?Current Attention Level: Sustained ?Memory: Decreased short-term memory ?Following Commands: Follows one step commands with increased time ?Safety/Judgement: Decreased awareness of safety, Decreased awareness of deficits ?Awareness: Emergent ?Problem Solving: Requires verbal cues, Requires tactile cues ?General Comments: Improved cognition. Alert to self, hospital, Month. Able to state what surgery happened. ?  ?  ? ?  ?Exercises Other Exercises ?Other Exercises: LAQ: 8 reps at EOB, cues for 3 sec holds, pt swinging LE at times rather than activating quad ?Other Exercises: attempted Rt hand digit opposition (AAROM provided) stress ball provided and pt demonstrated ability to grip ?Other Exercises: heel slides in bed x8 ? ?  ?General Comments   ?  ?  ? ?Pertinent Vitals/Pain Pain Assessment ?Pain Assessment: Faces ?Faces Pain Scale: Hurts little more ?Pain Location: generalized pain with mobility, RUE ?Pain Descriptors / Indicators: Discomfort, Grimacing, Guarding, Moaning ?Pain Intervention(s): Limited activity within patient's tolerance, Monitored during session, Repositioned  ? ? ?Home Living   ?  ?  ?  ?  ?  ?  ?  ?  ?  ?   ?  ?Prior Function    ?  ?  ?   ? ?PT Goals (current goals can now be found in the care plan section) Acute Rehab PT Goals ?Patient Stated Goal: get better ?PT Goal Formulation: With patient ?Time For Goal Achievement: 01/13/22 ?Potential to Achieve Goals: Fair ?Progress towards PT goals: Progressing toward goals ? ?  ?Frequency ? ? ? Min 3X/week ? ? ? ?  ?PT Plan Discharge plan needs to be updated  ? ? ?Co-evaluation PT/OT/SLP Co-Evaluation/Treatment: Yes ?Reason for Co-Treatment: For patient/therapist safety ?PT goals addressed during session: Mobility/safety with mobility ?OT goals addressed during session: ADL's and self-care ?  ? ?  ?AM-PAC PT "6 Clicks" Mobility   ?Outcome Measure ?  Help needed turning from your back to your side while in a flat bed without using bedrails?: Total ?Help needed moving from lying on your back to sitting on the side of a flat bed without using bedrails?: Total ?Help needed moving to and from a bed to a chair (including a wheelchair)?: Total ?Help needed standing up from a chair using your arms (e.g., wheelchair or bedside chair)?: Total ?Help needed to walk in hospital room?: Total ?Help needed climbing 3-5 steps with a railing? : Total ?6 Click Score: 6 ? ?  ?End of Session Equipment Utilized During Treatment: Oxygen ?Activity Tolerance: Patient limited by pain ?Patient left: in bed;with call bell/phone within reach;with bed alarm set ?Nurse Communication: Mobility status ?PT Visit Diagnosis: Unsteadiness on feet (R26.81);Pain ?Pain - Right/Left: Right ?Pain - part of body: Leg ?  ? ? ?Time: 5462-7035 ?PT Time Calculation (min) (ACUTE ONLY): 31 min ? ?Charges:  $Therapeutic Activity: 8-22 mins          ?          ? ?Coolidge Breeze, PT, DPT ?[facility] ?Office: [phone] ?Pager: [phone] ? ? ?Coolidge Breeze ?12/31/2021, 4:12 PM ? ?

## 2022-01-01 ENCOUNTER — Encounter: Payer: Self-pay | Admitting: *Deleted

## 2022-01-01 DIAGNOSIS — L02611 Cutaneous abscess of right foot: Secondary | ICD-10-CM | POA: Diagnosis not present

## 2022-01-01 DIAGNOSIS — A480 Gas gangrene: Secondary | ICD-10-CM | POA: Diagnosis not present

## 2022-01-01 DIAGNOSIS — J9602 Acute respiratory failure with hypercapnia: Secondary | ICD-10-CM | POA: Diagnosis not present

## 2022-01-01 DIAGNOSIS — L02619 Cutaneous abscess of unspecified foot: Secondary | ICD-10-CM | POA: Diagnosis not present

## 2022-01-01 LAB — GLUCOSE, CAPILLARY
Glucose-Capillary: 101 mg/dL — ABNORMAL HIGH (ref 70–99)
Glucose-Capillary: 122 mg/dL — ABNORMAL HIGH (ref 70–99)
Glucose-Capillary: 185 mg/dL — ABNORMAL HIGH (ref 70–99)
Glucose-Capillary: 196 mg/dL — ABNORMAL HIGH (ref 70–99)

## 2022-01-01 MED ORDER — INSULIN DETEMIR 100 UNIT/ML ~~LOC~~ SOLN
8.0000 [IU] | Freq: Every day | SUBCUTANEOUS | Status: DC
  Administered 2022-01-01 – 2022-01-14 (×14): 8 [IU] via SUBCUTANEOUS
  Filled 2022-01-01 (×15): qty 0.08

## 2022-01-01 MED ORDER — DEXAMETHASONE 4 MG PO TABS
4.0000 mg | ORAL_TABLET | Freq: Two times a day (BID) | ORAL | Status: DC
  Administered 2022-01-01 – 2022-01-14 (×26): 4 mg via ORAL
  Filled 2022-01-01 (×27): qty 1

## 2022-01-01 MED ORDER — DEXAMETHASONE 4 MG PO TABS: 4.0000 mg | ORAL_TABLET | Freq: Two times a day (BID) | ORAL | Status: DC

## 2022-01-01 MED ORDER — RIVAROXABAN 20 MG PO TABS
20.0000 mg | ORAL_TABLET | Freq: Every day | ORAL | Status: DC
  Administered 2022-01-01 – 2022-01-05 (×5): 20 mg via ORAL
  Filled 2022-01-01 (×5): qty 1

## 2022-01-01 NOTE — Progress Notes (Signed)
Brief oncology note: ? ?Chart has been reviewed.  The patient was admitted with abscess of the right foot and necrotizing fasciitis.  Status post right AKA on 12/26/2021.  Also has been treated for fungemia this admission.  Fluconazole due to complete on 01/05/2022. ? ?The patient is followed by Dr. Julien Nordmann for recurrent metastatic non-small cell lung cancer, adenocarcinoma.  Last seen by Dr. Julien Nordmann on 11/27/2021.  She was supposed to follow-up with [place] but no showed for 1 appointment and then canceled another appointment.  Guardant360 testing was obtained.  I do not have these results available to me.  However, in discussing with Dr. Julien Nordmann, she would need systemic chemotherapy upfront.  She does have a mutation that can be considered for second line targeted therapy. ? ?Dr. Julien Nordmann has requested follow-up with him next week to discuss her Guardant360 results.  He will outline the treatment plan when he sees her.  Thoracic navigator has been notified of her need for follow-up and is arranging outpatient follow-up. ? ?I spoke with the patient this morning.  I reviewed the above information with her.  I advised her about her appointment next week with Dr. Julien Nordmann.  She indicated that she thought the appointment was "too fast" because she is still healing from her surgery.  However, I advised the patient that her appoint with Dr. Julien Nordmann next week is to discuss her test results and then determine plan for systemic treatment.  There is no plan for systemic treatment at her appointment with [place] next week.  Dr. Julien Nordmann will determine timing of treatment once he sees her.  She states understanding of all the above.  All of her questions were answered. ? ?Future Appointments  ?Date Time Provider [place]  ?01/05/2022 11:30 AM Gery Pray, MD [facility] None  ?01/07/2022  2:00 PM Curt Bears, MD [facility] None  ?01/20/2022  2:00 PM [name], MD [facility]  ?01/28/2022 10:30 AM [facility] CASE MANAGER  [facility] [place]  ? ? ?Mikey Bussing, DNP, AGPCNP-BC, AOCNP ? ?

## 2022-01-01 NOTE — Progress Notes (Signed)
Chaplain engaged in an initial visit with Shannon [name], her younger sister, and another visitor.  Shannon [name] and her sister had questions about getting a Patent examiner for durable POA concerning banking, finances, etc.  Chaplain let them know that the hospital cannot notarize documents that are not healthcare POA related.  Chaplain explained to them that they could call in their own notary or a mobile service to help them.  Sister shared that she is from [place] and that she is trying to get as much done while she is here as possible.  Chaplain offered understanding and provided information on how to contact a notary outside of the hospital.   ? ?Chaplain provided assistance in understanding policy, education on notarization process, and support if needed to find witnesses.  ? ?Chaplain also checked in with Shannon [name] to see if she needed anything and she voiced she was doing fine.   ? ? 01/01/22 1400  ?Clinical Encounter Type  ?Visited With Patient and family together  ?Visit Type Initial;Social support  ? ? ?

## 2022-01-01 NOTE — Progress Notes (Signed)
?[facility] Progress Note ? ?Patient: Shannon [name]     ?IEP:[id number]  ?DOA: 12/17/2021   ?PCP: [name], MD  ? ?  ?  ?Brief hospital course: ?Is a 66 year old female with metastatic lung adenocarcinoma, COPD, HIV, chronic diastolic heart failure, atrial flutter, diabetes mellitus and obesity who presented to the hospital with a right lower extremity abscess.  She was diagnosed with necrotizing fasciitis and underwent incision and drainage by orthopedic surgery.  She was later discovered to have fungemia.  Infectious disease was consulted and has been assisting with antibiotic and antifungal choices.  Her hospital course was complicated by respiratory failure and worsening mental status and she was intubated and transferred to the ICU to be treated for COPD exacerbation. ?She was extubated on 5/1.  She subsequently underwent a right AKA on 12/26/2021 for nonhealing necrotizing fasciitis with gangrene. ?Her mental status and COPD exacerbation have resolved. ?She has completed antibiotics and antifungals and is now awaiting placement. ? ?Subjective:  ?Pain in stump and arm is now tolerable and she does not need further pain medication adjustment for now.  ? ?Assessment and Plan: ?Principal Problem: ?  Abscess of right foot, necrotizing fasciitis ?- Status post I&D on 12/20/2021 with OR culture revealing Klebsiella Proteus and Enterococcus ?- Status post right AKA on 5/5 ?- Completed Unasyn ?-Wound VAC to be removed at time of discharge ?- started Neurontin and increase Oxycodone to 7.5 mg QID which has resulted in significant improvement ? ?Active Problems: ?  Fungemia ?-Suspected to be as a result of the wound infection ?- Diflucan to be completed on 5/15 ?- ID has signed off ?- Ophthalmology eval completed on 5/9-no eye involvement noted ? ?  Acute respiratory failure with hypoxia and hypercapnia (HCC) ?COPD exacerbation ?- Failed BiPAP and required intubation ?- Exacerbation has resolved and she has  been weaned off of oxygen ?-We being treated with albuterol nebs as needed, Brovana twice daily, Pulmicort twice daily, prednisone taper, revefenacin neb daily ?- start incentive spirometry ?- attempt to wean oxygen ? ?Acute metabolic encephalopathy ?- Resolved ? ?  HIV disease ([place]) ?- Continue Biktarvy ?- CD4 count on 11/25/2021 was 143 ? ?  Uncontrolled hypertension ?  (HFpEF) heart failure with preserved ejection fraction ([place]) ?-Currently receiving carvedilol 12.5 mg twice daily, diltiazem 360 mg, hydralazine 25 mg 3 times daily, spironolactone 25 mg daily  ?-Current medications include metoprolol, labetalol and hydralazine IV ?- BP still elevated despite above medications ?-Heart rate ranges from 60s to 80s and therefore I will increase carvedilol ? ?  Morbid (severe) obesity due to excess calories ([place]) ?Body mass index is 39.39 kg/m?. ? ?  Atrial flutter ([place]) ?-Xarelto on hold due to acute blood loss postop- can resume today ?- Rate controlled on Cardizem and Coreg ? ?Acute blood loss anemia with macrocytosis ?- Status post 1 unit of packed red blood cell transfusion on 5/7 and a second unit on 5/28 ?- Hemoglobin has been stable since ? ? Adenocarcinoma of lung ([place]), recurrent metastatic non-small cell lung cancer ?- Status post curative radiotherapy to left upper lobe lung nodules-8/21 ?- sadly, she has had a recurrence as of Feb 2023> metastatic disease involving several areas of the bones including the ribs as well as thoracic spine and lumbar spine as well as sacrum.  She also has numerous small solid bilateral pulmonary nodules. ?- underwent CT-guided core biopsy of the metastatic lesion of the posterior right ninth rib and the final pathology was consistent with  adenocarcinoma of the lung. ?- Follows with Dr. Earlie Server ?- she missed an appointment last week and I have requested for her to be evaluated in the hospital and receive another appointment ? ?Diarrhea ?- resolved- hopefully can remove rectal  tube soon ? ?Thrombocytopenia ?- follow ? ?  ?DVT prophylaxis:  SCD's Start: 12/27/21 0843 ?rivaroxaban (XARELTO) tablet 20 mg   ?  Code Status: Full Code  ?Consultants: ortho, ID, opthal, critical care ?Level of Care: Level of care: Telemetry ?Disposition Plan:  ?Status is: Inpatient ?Remains inpatient appropriate because: infection, awaiting SNF ? ?Objective: ?  ?Vitals:  ? 01/01/22 0600 01/01/22 0756 01/01/22 1039 01/01/22 1044  ?BP: (!) 142/54  (!) 154/68 (!) 154/68  ?Pulse: 72     ?Resp: 15     ?Temp: 98.6 ?F (37 ?C)     ?TempSrc: Oral     ?SpO2: 97% 98%    ?Weight:      ?Height:      ? ?Filed Weights  ? 12/29/21 0500 12/30/21 0332 12/31/21 0500  ?Weight: 100.3 kg 100.2 kg 104.1 kg  ? ?Exam: ?General exam: Appears comfortable  ?HEENT: PERRLA, oral mucosa moist, no sclera icterus or thrush ?Respiratory system: Clear to auscultation. Respiratory effort normal. ?Cardiovascular system: S1 & S2 heard, regular rate and rhythm ?Gastrointestinal system: Abdomen soft, non-tender, nondistended. Normal bowel sounds   ?Central nervous system: Alert and oriented. No focal neurological deficits. ?Extremities: No cyanosis, clubbing  - right AKA and wound vac- severe edema of right arm ?Skin: denuded skin on right arm ?Psychiatry:  Mood & affect appropriate.     ? ?Imaging and lab data was personally reviewed ? ? ? CBC: ?Recent Labs  ?Lab 12/26/21 ?0603 12/28/21 ?5027 12/28/21 ?1620 12/29/21 ?0406 12/29/21 ?1738 12/30/21 ?7412  ?WBC 8.4 5.9  --  6.2 7.0 5.0  ?NEUTROABS 7.0 4.9  --  5.0  --  3.9  ?HGB 9.0* 6.7* 8.1* 7.0* 8.2* 8.2*  ?HCT 28.5* 22.1* 24.8* 22.2* 25.4* 25.9*  ?MCV 102.2* 104.2*  --  100.5* 100.0 100.0  ?PLT 121* 108*  --  126* 137* 148*  ? ? ?Basic Metabolic Panel: ?Recent Labs  ?Lab 12/26/21 ?0603 12/28/21 ?8786 12/29/21 ?0406 12/30/21 ?7672  ?NA 142 141 144 144  ?K 4.4 4.2 3.9 3.9  ?CL 106 104 104 103  ?CO2 30 34* 36* 36*  ?GLUCOSE 140* 155* 143* 106*  ?BUN 41* 36* 41* 36*  ?CREATININE 1.11* 0.93 1.00 0.97   ?CALCIUM 10.3 9.8 9.9 9.9  ?MG  --  1.8 1.7 1.9  ? ? ?GFR: ?Estimated Creatinine Clearance: 66.2 mL/min (by C-G formula based on SCr of 0.97 mg/dL). ? ?Scheduled Meds: ? (feeding supplement) PROSource Plus  30 mL Oral BID BM  ? arformoterol  15 mcg Nebulization BID  ? vitamin C  1,000 mg Oral Daily  ? bictegravir-emtricitabine-tenofovir AF  1 tablet Oral Daily  ? budesonide (PULMICORT) nebulizer solution  0.5 mg Nebulization BID  ? carvedilol  12.5 mg Oral BID WC  ? chlorhexidine  15 mL Mouth Rinse BID  ? Chlorhexidine Gluconate Cloth  6 each Topical Daily  ? dexamethasone  4 mg Oral BID WC  ? diltiazem  360 mg Oral Daily  ? docusate sodium  100 mg Oral Daily  ? famotidine  20 mg Oral Daily  ? feeding supplement  237 mL Oral BID BM  ? fluconazole  400 mg Oral Daily  ? gabapentin  200 mg Oral BID  ? hydrALAZINE  25 mg Oral  TID  ? insulin aspart  0-15 Units Subcutaneous TID WC  ? insulin aspart  0-5 Units Subcutaneous QHS  ? insulin detemir  8 Units Subcutaneous Daily  ? mouth rinse  15 mL Mouth Rinse q12n4p  ? multivitamin with minerals  1 tablet Oral Daily  ? nutrition supplement (JUVEN)  1 packet Oral BID BM  ? oxyCODONE  7.5 mg Oral Q6H  ? pantoprazole  40 mg Oral Daily  ? QUEtiapine  25 mg Oral QHS  ? revefenacin  175 mcg Nebulization Daily  ? rivaroxaban  20 mg Oral Q supper  ? sodium chloride flush  10-40 mL Intracatheter Q12H  ? spironolactone  25 mg Oral Daily  ? zinc sulfate  220 mg Oral Daily  ? ?Continuous Infusions: ? sodium chloride 0 mL/hr at 12/27/21 0426  ? ? ? LOS: 15 days  ? ?Author: ?Debbe Odea  ?01/01/2022 3:16 PM ?   ?

## 2022-01-01 NOTE — TOC Progression Note (Signed)
Transition of Care (TOC) - Progression Note  ? ? ?Patient Details  ?Name: Shannon [name] ?MRN: [id number] ?Date of Birth: 28-Apr-1956 ? ?Transition of Care (TOC) CM/SW Contact  ?Saraiah Bhat, Marjie Skiff, RN ?Phone Number: ?01/01/2022, 9:40 AM ? ?Clinical Narrative:    ?Spoke with sister Sharyn Lull at length yesterday for dc planning as pt deferred to her when I spoke with her. SNF bed offers provided to [name] and [name]. ? ?SNF auth completed with [facility] and was approved 5/11-5/15 [id number].  ? ? ?Expected Discharge Plan: [name] ?Barriers to Discharge: Continued Medical Work up, SNF Pending bed offer ? ?Expected Discharge Plan and Services ?Expected Discharge Plan: [name] ?  ?Discharge Planning Services: CM Consult ?Post Acute Care Choice: [name] ?Living arrangements for the past 2 months: Apartment ?                ?  ?  ?  ?Readmission Risk Interventions ? ?  12/22/2021  ?  9:44 AM  ?Readmission Risk Prevention Plan  ?Transportation Screening Complete  ?Medication Review Press photographer) Complete  ?[place] or Home Care Consult Complete  ?SW Recovery Care/Counseling Consult Complete  ?Palliative Care Screening Not Applicable  ?Skilled Nursing Facility Complete  ? ? ?

## 2022-01-01 NOTE — Telephone Encounter (Signed)
I spoke with pts sister and advised of this. She has the following follow-up questions because she is concerned about waiting to treat the cancer: ? ?1. Can the results and treatment options be discussed with [name] AND her Sharyn Lull, the sister) while she is still hospitalized? She states they need to discuss next steps as a family. Sister Sharyn Lull is VERY adamant about making sure she is present during this conversation given that pt has not been responsible, thus causing her to loose her leg. ? ?2. What action can be taken to treat Hevin's cancer if she is in rehab for an extended about of time and can this be discussed and reviewed now to allow everyone to have time to think it all through? ?

## 2022-01-01 NOTE — Progress Notes (Signed)
Per dr Julien Nordmann, patient is schedule to see him post hospitalization on 5/17 at 2:00.  Will be on patients discharge planning information.  ?

## 2022-01-01 NOTE — Care Management Important Message (Signed)
Important Message ? ?Patient Details IM Letter given to the Patient. ?Name: Shannon [name] ?MRN: [id number] ?Date of Birth: 05-10-1956 ? ? ?Medicare Important Message Given:  Yes ? ? ? ? ?Kerin Salen ?01/01/2022, 8:53 AM ?

## 2022-01-02 DIAGNOSIS — L02619 Cutaneous abscess of unspecified foot: Secondary | ICD-10-CM | POA: Diagnosis not present

## 2022-01-02 DIAGNOSIS — J9602 Acute respiratory failure with hypercapnia: Secondary | ICD-10-CM | POA: Diagnosis not present

## 2022-01-02 DIAGNOSIS — L02611 Cutaneous abscess of right foot: Secondary | ICD-10-CM | POA: Diagnosis not present

## 2022-01-02 DIAGNOSIS — A480 Gas gangrene: Secondary | ICD-10-CM | POA: Diagnosis not present

## 2022-01-02 LAB — GLUCOSE, CAPILLARY
Glucose-Capillary: 142 mg/dL — ABNORMAL HIGH (ref 70–99)
Glucose-Capillary: 159 mg/dL — ABNORMAL HIGH (ref 70–99)
Glucose-Capillary: 212 mg/dL — ABNORMAL HIGH (ref 70–99)
Glucose-Capillary: 202 mg/dL — ABNORMAL HIGH (ref 70–99)

## 2022-01-02 MED ORDER — OXYCODONE HCL 5 MG PO TABS
5.0000 mg | ORAL_TABLET | Freq: Four times a day (QID) | ORAL | Status: DC
  Filled 2022-01-02: qty 1

## 2022-01-02 MED ORDER — GABAPENTIN 100 MG PO CAPS
100.0000 mg | ORAL_CAPSULE | Freq: Two times a day (BID) | ORAL | Status: DC
  Administered 2022-01-02 – 2022-01-03 (×2): 100 mg via ORAL
  Filled 2022-01-02 (×2): qty 1

## 2022-01-02 MED ORDER — OXYCODONE HCL 5 MG PO TABS
5.0000 mg | ORAL_TABLET | Freq: Four times a day (QID) | ORAL | Status: DC | PRN
Start: 2022-01-02 — End: 2022-01-03
  Administered 2022-01-03: 5 mg via ORAL
  Filled 2022-01-02: qty 1

## 2022-01-02 NOTE — Progress Notes (Signed)
Per dayshift RN Joycelyn Das, RN) patient is altered but still can answer orientation questions. During bedside report it was noted that pt. was having a hard time following conversation and needed constant reorientation. Provider notified at 43. ?

## 2022-01-02 NOTE — Progress Notes (Signed)
Patient continues to be confused on and off. During morning assessment patient was able to tell RN her name, DOB, where she was and the Month/year, however, when trying to have a conversation the patient does not make any sense. Dr. Wynelle Cleveland is aware and will look over medications and make any necessary changes. Will continue to monitor. ?

## 2022-01-02 NOTE — Progress Notes (Signed)
Occupational Therapy Treatment ?Patient Details ?Name: Shannon [name] ?MRN: [id number] ?DOB: 15-Jan-1956 ?Today's Date: 01/02/2022 ? ? ?History of present illness Shannon [name] is a 66 y.o. woman with metastatic non-small cell lung cancer, asthma/COPD, GERD, HTN, and HIV who was admitted 12/17/21  due to right lower extremity wound  S/P irrigation and debridement of right foot and calf wounds on 12/17/21 .  On 4/28 she developed swelling of her right upper extremity and went for incision and drainage morning of 4/29 without overt signs of necrotizing fasciitis. She developed progressive mental status changes 4/28 to 4/29 with obtundation. She was transferred to the ICU and PCCM consulted for hypercapnic respiratory failure.  Intubated 4/29 and extubated 12/22/21. Pt underwent Rt AKA on 12/26/21. ?  ?OT comments ? OT/PT co treat today for safety. Extended amount of time in room secondary to patient's confusion and tangential conversation and difficulty staying on task. Patient also found with [place] and o2 sat 71%. Took time to get patient recovered and RN in room. Once o2 sat back in to 90s patient transferred to edge of bed to work on sitting tolerance. Standing and transfers deferred for safety reasons. Once patient returned to supine patient found to be soiled and worked on rolling to assist with self care. Patient still total for pericare but able to roll self with bed rails. She struggles some due to right arm pain. Patient is progressing but today limited by confusion and o2 sats. Cont POC.   ? ?Recommendations for follow up therapy are one component of a multi-disciplinary discharge planning process, led by the attending physician.  Recommendations may be updated based on patient status, additional functional criteria and insurance authorization. ?   ?Follow Up Recommendations ? Skilled nursing-short term rehab (<3 hours/day)  ?  ?Assistance Recommended at Discharge Frequent or constant Supervision/Assistance  ?Patient can  return home with the following ? Assistance with cooking/housework;Direct supervision/assist for medications management;Direct supervision/assist for financial management;Assist for transportation;Help with stairs or ramp for entrance;Two people to help with walking and/or transfers;A lot of help with bathing/dressing/bathroom ?  ?Equipment Recommendations ? BSC/3in1  ?  ?Recommendations for Other Services   ? ?  ?Precautions / Restrictions Precautions ?Precautions: Fall ?Precaution Comments: Rt AKA, wound vac, Rt UE I&D dressing in place. ?Required Braces or Orthoses: Splint/Cast ?Splint/Cast: Has a darco shoe in package in room. Orderd by ortho post I and D of right leg. ?Restrictions ?Weight Bearing Restrictions: Yes ?RLE Weight Bearing: Non weight bearing ?Other Position/Activity Restrictions: Rt AKA  ? ? ?  ? ?Mobility Bed Mobility ?  ?  ?  ?  ?  ?  ?  ?  ?  ? ?Transfers ?  ?  ?  ?  ?  ?  ?  ?  ?  ?  ?  ?  ?Balance Overall balance assessment: Needs assistance ?Sitting-balance support: Bilateral upper extremity supported, Feet supported ?Sitting balance-Leahy Scale: Fair ?Sitting balance - Comments: Pt able to sit without support for several minutes - but mostly propped on UEs ?  ?  ?  ?  ?  ?  ?  ?  ?  ?  ?  ?  ?  ?  ?  ?   ? ?ADL either performed or assessed with clinical judgement  ? ?ADL Overall ADL's : Needs assistance/impaired ?  ?  ?  ?  ?  ?  ?  ?  ?  ?  ?  ?  ?  ?  ?Toileting-  Clothing Manipulation and Hygiene: Total assistance;Bed level ?Toileting - Clothing Manipulation Details (indicate cue type and reason): Total assist for pericare but able to roll left and right with bed rails ?  ?  ?  ?General ADL Comments: Patient max assist to transfer to edge of bed to work on sitting tolerance. ABle to sit edge of bed propped on UEs. Reports dizziness. She had one LOB to the right when reporting dizziness and patient returned to supine for safety. ?  ? ?Extremity/Trunk Assessment   ?  ?  ?  ?  ?  ? ?Vision  Patient Visual Report: No change from baseline ?  ?  ?Perception   ?  ?Praxis   ?  ? ?Cognition Arousal/Alertness: Awake/alert ?Behavior During Therapy: Anxious ?Overall Cognitive Status: Impaired/Different from baseline ?Area of Impairment: Memory, Safety/judgement, Following commands, Awareness, Problem solving, Attention ?  ?  ?  ?  ?  ?  ?  ?  ?Orientation Level: Disoriented to, Time, Situation ?Current Attention Level: Sustained ?Memory: Decreased short-term memory, Decreased recall of precautions ?Following Commands: Follows one step commands with increased time ?Safety/Judgement: Decreased awareness of safety, Decreased awareness of deficits ?Awareness: Emergent ?Problem Solving: Requires verbal cues, Requires tactile cues, Difficulty sequencing, Decreased initiation, Slow processing ?General Comments: cognition has declined, pt more confused, disoriented, and inattentive. Pt was not wearing O2NC upon entry and SpO2 was 71%, increased O2 deliver to 6L and pt recovered to 100%. Reduced O2 delivery to 2L for duration of session with max cuing and encouragment to complete pursed lip breathing. Pt would benefit from O2 mask as she is a mouth-breather ?  ?  ?   ?Exercises   ? ?  ?Shoulder Instructions   ? ? ?  ?General Comments Pt required max cuing to continue pursed lip breathing, would benefit from O2 mask.  ? ? ?Pertinent Vitals/ Pain       Pain Assessment ?Pain Assessment: Faces ?Faces Pain Scale: Hurts even more ?Pain Location: generalized pain with mobility, RUE ?Pain Descriptors / Indicators: Discomfort, Grimacing, Guarding, Moaning ?Pain Intervention(s): Limited activity within patient's tolerance, Monitored during session ? ?Home Living   ?  ?  ?  ?  ?  ?  ?  ?  ?  ?  ?  ?  ?  ?  ?  ?  ?  ?  ? ?  ?Prior Functioning/Environment    ?  ?  ?  ?   ? ?Frequency ? Min 2X/week  ? ? ? ? ?  ?Progress Toward Goals ? ?OT Goals(current goals can now be found in the care plan section) ? Progress towards OT goals:  Progressing toward goals ? ?Acute Rehab OT Goals ?Patient Stated Goal: get better ?OT Goal Formulation: With patient ?Time For Goal Achievement: 01/12/22 ?Potential to Achieve Goals: Good  ?Plan Discharge plan remains appropriate   ? ?Co-evaluation ? ? ? PT/OT/SLP Co-Evaluation/Treatment: Yes ?Reason for Co-Treatment: For patient/therapist safety;To address functional/ADL transfers;Necessary to address cognition/behavior during functional activity ?PT goals addressed during session: Mobility/safety with mobility ?OT goals addressed during session: ADL's and self-care ?  ? ?  ?AM-PAC OT "6 Clicks" Daily Activity     ?Outcome Measure ? ? Help from another person eating meals?: A Little ?Help from another person taking care of personal grooming?: A Lot ?Help from another person toileting, which includes using toliet, bedpan, or urinal?: Total ?Help from another person bathing (including washing, rinsing, drying)?: A Lot ?Help from another person to put on and  taking off regular upper body clothing?: Total ?Help from another person to put on and taking off regular lower body clothing?: Total ?6 Click Score: 10 ? ?  ?End of Session Equipment Utilized During Treatment: Oxygen ? ?OT Visit Diagnosis: Other abnormalities of gait and mobility (R26.89) ?  ?Activity Tolerance Patient tolerated treatment well ?  ?Patient Left in bed;with call bell/phone within reach;with bed alarm set ?  ?Nurse Communication Mobility status ?  ? ?   ? ?Time: 9038-3338 ?OT Time Calculation (min): 41 min ? ?Charges: OT General Charges ?$OT Visit: 1 Visit ?OT Treatments ?$Self Care/Home Management : 8-22 mins ? ?Lashawna Poche, OTR/L ?Acute Care Rehab Services  ?Office [phone] ?Pager: [phone]  ? ?Willa Brocks L Fatimata Talsma ?01/02/2022, 4:09 PM ?

## 2022-01-02 NOTE — Progress Notes (Signed)
?[facility] Progress Note ? ?Patient: Shannon [name]     ?YHC:[id number]  ?DOA: 12/17/2021   ?PCP: [name], MD  ? ?  ?  ?Brief hospital course: ?Is a 66 year old female with metastatic lung adenocarcinoma, COPD, HIV, chronic diastolic heart failure, atrial flutter, diabetes mellitus and obesity who presented to the hospital with a right lower extremity abscess.  She was diagnosed with necrotizing fasciitis and underwent incision and drainage by orthopedic surgery.  She was later discovered to have fungemia.  Infectious disease was consulted and has been assisting with antibiotic and antifungal choices.  Her hospital course was complicated by respiratory failure and worsening mental status and she was intubated and transferred to the ICU to be treated for COPD exacerbation. ?She was extubated on 5/1.  She subsequently underwent a right AKA on 12/26/2021 for nonhealing necrotizing fasciitis with gangrene. ?Her mental status and COPD exacerbation have resolved. ?She has completed antibiotics and antifungals and is now awaiting placement. ? ?Subjective:  ?Very sleepy and slightly confused. ? ?Assessment and Plan: ?Principal Problem: ?  Abscess of right foot, necrotizing fasciitis ?- Status post I&D on 12/20/2021 with OR culture revealing Klebsiella Proteus and Enterococcus ?- Status post right AKA on 5/5 ?- Completed Unasyn ?-Wound VAC to be removed at time of discharge ?- due to confusion, have reduced dose of Oxycodone and Neurontin today.  ? ?Active Problems: ?  Fungemia ?-Suspected to be as a result of the wound infection ?- Diflucan to be completed on 5/15 ?- ID has signed off ?- Ophthalmology eval completed on 5/9-no eye involvement noted ? ?  Acute respiratory failure with hypoxia and hypercapnia (HCC) ?COPD exacerbation ?- Failed BiPAP and required intubation ?- Exacerbation has resolved and she has been weaned off of oxygen ?-We being treated with albuterol nebs as needed, Brovana twice daily,  Pulmicort twice daily, prednisone taper, revefenacin neb daily ?- start incentive spirometry ?- attempt to wean oxygen- still requiring oxygen and often removes her oxygen despite being advised not to. ? ?Acute metabolic encephalopathy ?- Resolved ? ?  HIV disease ([place]) ?- Continue Biktarvy ?- CD4 count on 11/25/2021 was 143 ? ?  Uncontrolled hypertension ?  (HFpEF) heart failure with preserved ejection fraction ([place]) ?-Currently receiving carvedilol 12.5 mg twice daily, diltiazem 360 mg, hydralazine 25 mg 3 times daily, spironolactone 25 mg daily  ?-Current medications include metoprolol, labetalol and hydralazine IV ?- BP still elevated despite above medications ?-Heart rate ranges from 60s to 80s and therefore I will increase carvedilol ? ?  Morbid (severe) obesity due to excess calories ([place]) ?Body mass index is 39.2 kg/m?. ? ?  Atrial flutter ([place]) ?-Xarelto on hold due to acute blood loss postop- can resume today ?- Rate controlled on Cardizem and Coreg ? ?Acute blood loss anemia with macrocytosis ?- Status post 1 unit of packed red blood cell transfusion on 5/7 and a second unit on 5/28 ?- Hemoglobin has been stable since ? ? Adenocarcinoma of lung ([place]), recurrent metastatic non-small cell lung cancer ?- Status post curative radiotherapy to left upper lobe lung nodules-8/21 ?- sadly, she has had a recurrence as of Feb 2023> metastatic disease involving several areas of the bones including the ribs as well as thoracic spine and lumbar spine as well as sacrum.  She also has numerous small solid bilateral pulmonary nodules. ?- underwent CT-guided core biopsy of the metastatic lesion of the posterior right ninth rib and the final pathology was consistent with adenocarcinoma of the lung. ?-  Follows with Dr. Earlie Server ?- she missed an appointment last week and I have requested for her to be evaluated in the hospital and receive another appointment ? ?Diarrhea ?- resolved- hopefully can remove rectal tube  soon ? ?Thrombocytopenia ?- follow ? ?  ?DVT prophylaxis:  SCD's Start: 12/27/21 0843 ?rivaroxaban (XARELTO) tablet 20 mg   ?  Code Status: Full Code  ?Consultants: ortho, ID, opthal, critical care ?Level of Care: Level of care: Telemetry ?Disposition Plan:  ?Status is: Inpatient ?Remains inpatient appropriate because:confusion ? ?Objective: ?  ?Vitals:  ? 01/02/22 0500 01/02/22 0753 01/02/22 1039 01/02/22 1316  ?BP:   121/72 (!) 109/92  ?Pulse:   78 70  ?Resp:    20  ?Temp:    98.1 ?F (36.7 ?C)  ?TempSrc:    Oral  ?SpO2:  96%  (!) 68%  ?Weight: 103.6 kg     ?Height:      ? ?Filed Weights  ? 12/30/21 0332 12/31/21 0500 01/02/22 0500  ?Weight: 100.2 kg 104.1 kg 103.6 kg  ? ?Exam: ?General exam: Appears comfortable  ?HEENT: PERRLA, oral mucosa moist, no sclera icterus or thrush ?Respiratory system: Clear to auscultation. Respiratory effort normal. ?Cardiovascular system: S1 & S2 heard, regular rate and rhythm ?Gastrointestinal system: Abdomen soft, non-tender, nondistended. Normal bowel sounds   ?Central nervous system: Alert and oriented to place and person- otherwise very confused.. No focal neurological deficits. ?Extremities: No cyanosis, clubbing or edema- R BKA- right arm swelling improving ?Skin: ulcers on right arm healing ?Psychiatry:  Mood & affect appropriate.   ? ?Imaging and lab data was personally reviewed ? ? ? CBC: ?Recent Labs  ?Lab 12/28/21 ?0412 12/28/21 ?1620 12/29/21 ?0406 12/29/21 ?1738 12/30/21 ?7741  ?WBC 5.9  --  6.2 7.0 5.0  ?NEUTROABS 4.9  --  5.0  --  3.9  ?HGB 6.7* 8.1* 7.0* 8.2* 8.2*  ?HCT 22.1* 24.8* 22.2* 25.4* 25.9*  ?MCV 104.2*  --  100.5* 100.0 100.0  ?PLT 108*  --  126* 137* 148*  ? ? ?Basic Metabolic Panel: ?Recent Labs  ?Lab 12/28/21 ?0412 12/29/21 ?0406 12/30/21 ?2878  ?NA 141 144 144  ?K 4.2 3.9 3.9  ?CL 104 104 103  ?CO2 34* 36* 36*  ?GLUCOSE 155* 143* 106*  ?BUN 36* 41* 36*  ?CREATININE 0.93 1.00 0.97  ?CALCIUM 9.8 9.9 9.9  ?MG 1.8 1.7 1.9  ? ? ?GFR: ?Estimated Creatinine  Clearance: 66 mL/min (by C-G formula based on SCr of 0.97 mg/dL). ? ?Scheduled Meds: ? (feeding supplement) PROSource Plus  30 mL Oral BID BM  ? arformoterol  15 mcg Nebulization BID  ? vitamin C  1,000 mg Oral Daily  ? bictegravir-emtricitabine-tenofovir AF  1 tablet Oral Daily  ? budesonide (PULMICORT) nebulizer solution  0.5 mg Nebulization BID  ? carvedilol  12.5 mg Oral BID WC  ? chlorhexidine  15 mL Mouth Rinse BID  ? Chlorhexidine Gluconate Cloth  6 each Topical Daily  ? dexamethasone  4 mg Oral BID WC  ? diltiazem  360 mg Oral Daily  ? docusate sodium  100 mg Oral Daily  ? famotidine  20 mg Oral Daily  ? feeding supplement  237 mL Oral BID BM  ? fluconazole  400 mg Oral Daily  ? gabapentin  100 mg Oral BID  ? hydrALAZINE  25 mg Oral TID  ? insulin aspart  0-15 Units Subcutaneous TID WC  ? insulin aspart  0-5 Units Subcutaneous QHS  ? insulin detemir  8 Units Subcutaneous  Daily  ? mouth rinse  15 mL Mouth Rinse q12n4p  ? multivitamin with minerals  1 tablet Oral Daily  ? nutrition supplement (JUVEN)  1 packet Oral BID BM  ? oxyCODONE  5 mg Oral Q6H  ? pantoprazole  40 mg Oral Daily  ? QUEtiapine  25 mg Oral QHS  ? revefenacin  175 mcg Nebulization Daily  ? rivaroxaban  20 mg Oral Q supper  ? sodium chloride flush  10-40 mL Intracatheter Q12H  ? spironolactone  25 mg Oral Daily  ? zinc sulfate  220 mg Oral Daily  ? ?Continuous Infusions: ? sodium chloride 0 mL/hr at 12/27/21 0426  ? ? ? LOS: 16 days  ? ?Author: ?Debbe Odea  ?01/02/2022 2:03 PM ?   ?

## 2022-01-02 NOTE — Progress Notes (Signed)
Patient's sister, Sharyn Lull, asked to speak with doctor regarding concerns about discharge. Sharyn Lull also noted patient was confused during her visit but seemed to clear up a little as they talked. RN reassured her that the MD was already aware of her confusion. Dr. Wynelle Cleveland states she will call Sharyn Lull with an update tomorrow. ?

## 2022-01-02 NOTE — Progress Notes (Signed)
Physical Therapy Treatment ?Patient Details ?Name: Shannon [name] ?MRN: [id number] ?DOB: [birth year] ?Today's Date: 01/02/2022 ? ? ?History of Present Illness Shannon [name] is a 66 y.o. woman with metastatic non-small cell lung cancer, asthma/COPD, GERD, HTN, and HIV who was admitted 12/17/21  due to right lower extremity wound  S/P irrigation and debridement of right foot and calf wounds on 12/17/21 .  On 4/28 she developed swelling of her right upper extremity and went for incision and drainage morning of 4/29 without overt signs of necrotizing fasciitis. She developed progressive mental status changes 4/28 to 4/29 with obtundation. She was transferred to the ICU and PCCM consulted for hypercapnic respiratory failure.  Intubated 4/29 and extubated 12/22/21. Pt underwent Rt AKA on 12/26/21. ? ?  ?PT Comments  ? ? Pt lying in bed upon entry with the [place] off, SpO2 measured 71%. Replaced [place] and bumped O2 to 6L and SpO2 recovered to 100%, reduced to 2L for duration of session though pt required max cuing to continue pursed lip breathing to maintain saturation above 90%. Pt exhibiting increased confusion and decreased attention compared to visit on 12/31/21, possible this is secondary to hypoxia. Pt required max assist +2 for bed mobility excepting rolling for pericare during which she required mod assist +1. Pt sat EOB for several minutes and completed long-arc quads. Pt returned to supine and completed heel slides. Further mobility deferred secondary to increased confusion. Upon exit, checked SpO2 and it was hovering at 90-91% so increased O2 to 3L, RN aware. We will continue to follow acutely.  ?  ?  ?Recommendations for follow up therapy are one component of a multi-disciplinary discharge planning process, led by the attending physician.  Recommendations may be updated based on patient status, additional functional criteria and insurance authorization. ? ?Follow Up Recommendations ? Skilled nursing-short term rehab (<3  hours/day) ?  ?  ?Assistance Recommended at Discharge Frequent or constant Supervision/Assistance  ?Patient can return home with the following Two people to help with walking and/or transfers;Assistance with cooking/housework;Assist for transportation;Help with stairs or ramp for entrance;Two people to help with bathing/dressing/bathroom;Direct supervision/assist for medications management ?  ?Equipment Recommendations ? Wheelchair cushion (measurements PT);Wheelchair (measurements PT)  ?  ?Recommendations for Other Services Rehab consult ? ? ?  ?Precautions / Restrictions Precautions ?Precautions: Fall ?Precaution Comments: Rt AKA, wound vac, Rt UE I&D dressing in place. ?Required Braces or Orthoses: Splint/Cast ?Splint/Cast: Has a darco shoe in package in room. Orderd by ortho post I and D of right leg. ?Restrictions ?Weight Bearing Restrictions: Yes ?RLE Weight Bearing: Non weight bearing ?Other Position/Activity Restrictions: Rt AKA  ?  ? ?Mobility ? Bed Mobility ?Overal bed mobility: Needs Assistance ?Bed Mobility: Rolling, Sidelying to Sit, Supine to Sit, Sit to Sidelying ?Rolling: Mod assist, +2 for safety/equipment ?Sidelying to sit: +2 for physical assistance, +2 for safety/equipment, HOB elevated, Max assist ?Supine to sit: Max assist, +2 for physical assistance, +2 for safety/equipment, HOB elevated ?Sit to supine: +2 for physical assistance, +2 for safety/equipment, Max assist, HOB elevated ?Sit to sidelying: Max assist, +2 for physical assistance, +2 for safety/equipment ?General bed mobility comments: Pt generally required max assist +2 for bed mobility with the exception of rolling in bed for pericare; rolling from supine to L side and back to supine pt completed with supervion only, no physical assist requied. Rolling from supine to R side pt required mod assist for pelvis, supervision for returning to supine. ?  ? ?Transfers ?  ?  ?  ?  ?  ?  ?  ?  ?  ?  General transfer comment: will need a maxisky ?   ? ?Ambulation/Gait ?  ?  ?  ?  ?  ?  ?  ?General Gait Details: not safe at present ? ? ?Stairs ?  ?  ?  ?  ?  ? ? ?Wheelchair Mobility ?  ? ?Modified Rankin (Stroke Patients Only) ?  ? ? ?  ?Balance Overall balance assessment: Needs assistance ?Sitting-balance support: Feet unsupported, Bilateral upper extremity supported ?Sitting balance-Leahy Scale: Fair ?Sitting balance - Comments: Pt able to sit without support for several minutes. ?  ?  ?  ?Standing balance comment: deferred, unsafe at present ?  ?  ?  ?  ?  ?  ?  ?  ?  ?  ?  ?  ? ?  ?Cognition Arousal/Alertness: Awake/alert ?Behavior During Therapy: Anxious ?Overall Cognitive Status: Within Functional Limits for tasks assessed ?Area of Impairment: Memory, Safety/judgement, Following commands, Awareness, Problem solving, Attention ?  ?  ?  ?  ?  ?  ?  ?  ?Orientation Level: Disoriented to, Time (Could not name date) ?Current Attention Level: Sustained ?Memory: Decreased short-term memory ?Following Commands: Follows one step commands with increased time ?Safety/Judgement: Decreased awareness of safety, Decreased awareness of deficits ?Awareness: Emergent ?Problem Solving: Requires verbal cues, Requires tactile cues, Difficulty sequencing, Decreased initiation, Slow processing ?General Comments: cognition has declined, pt more confused, disoriented, and inattentive. Pt was not wearing O2NC upon entry and SpO2 was 71%, increased O2 deliver to 6L and pt recovered to 100%. Reduced O2 delivery to 2L for duration of session with max cuing and encouragment to complete pursed lip breathing. Pt would benefit from O2 mask as she is a mouth-breather ?  ?  ? ?  ?Exercises Other Exercises ?Other Exercises: LAQ: 10 reps at EOB, cues for 3 sec holds, pt swinging LE at times rather than activating quad ?Other Exercises: Attempted hip ABD/adduction of LLE but pt reporting too tired. ?Other Exercises: heel slides in bed x10 ? ?  ?General Comments General comments (skin  integrity, edema, etc.): Pt required max cuing to continue pursed lip breathing, would benefit from O2 mask. ?  ?  ? ?Pertinent Vitals/Pain Pain Assessment ?Pain Assessment: Faces ?Faces Pain Scale: Hurts even more ?Consolability: no need to console ?Pain Location: generalized pain with mobility, RUE ?Pain Descriptors / Indicators: Discomfort, Grimacing, Guarding, Moaning ?Pain Intervention(s): Limited activity within patient's tolerance, Monitored during session, Repositioned  ? ? ?Home Living   ?  ?  ?  ?  ?  ?  ?  ?  ?  ?   ?  ?Prior Function    ?  ?  ?   ? ?PT Goals (current goals can now be found in the care plan section) Acute Rehab PT Goals ?Patient Stated Goal: get better ?PT Goal Formulation: With patient ?Time For Goal Achievement: 01/13/22 ?Potential to Achieve Goals: Fair ?Progress towards PT goals: Progressing toward goals ? ?  ?Frequency ? ? ? Min 3X/week ? ? ? ?  ?PT Plan Current plan remains appropriate  ? ? ?Co-evaluation PT/OT/SLP Co-Evaluation/Treatment: Yes ?Reason for Co-Treatment: For patient/therapist safety ?PT goals addressed during session: Mobility/safety with mobility ?OT goals addressed during session: ADL's and self-care ?  ? ?  ?AM-PAC PT "6 Clicks" Mobility   ?Outcome Measure ? Help needed turning from your back to your side while in a flat bed without using bedrails?: Total ?Help needed moving from lying on your back to sitting on the side of a  flat bed without using bedrails?: Total ?Help needed moving to and from a bed to a chair (including a wheelchair)?: Total ?Help needed standing up from a chair using your arms (e.g., wheelchair or bedside chair)?: Total ?Help needed to walk in hospital room?: Total ?Help needed climbing 3-5 steps with a railing? : Total ?6 Click Score: 6 ? ?  ?End of Session Equipment Utilized During Treatment: Oxygen (SpO2 monitored throughout, left on 3L via [place] at exit) ?Activity Tolerance: Patient limited by pain ?Patient left: in bed;with call bell/phone  within reach;with bed alarm set ?Nurse Communication: Mobility status;Other (comment) (O2) ?PT Visit Diagnosis: Unsteadiness on feet (R26.81);Pain ?Pain - Right/Left: Right ?Pain - part of body: Leg ?  ? ? ?Time: 8184

## 2022-01-03 ENCOUNTER — Inpatient Hospital Stay (HOSPITAL_COMMUNITY): Payer: Medicare Other

## 2022-01-03 DIAGNOSIS — J9602 Acute respiratory failure with hypercapnia: Secondary | ICD-10-CM | POA: Diagnosis not present

## 2022-01-03 DIAGNOSIS — L02611 Cutaneous abscess of right foot: Secondary | ICD-10-CM | POA: Diagnosis not present

## 2022-01-03 DIAGNOSIS — L02619 Cutaneous abscess of unspecified foot: Secondary | ICD-10-CM | POA: Diagnosis not present

## 2022-01-03 DIAGNOSIS — A480 Gas gangrene: Secondary | ICD-10-CM | POA: Diagnosis not present

## 2022-01-03 LAB — CBC
HCT: 27.5 % — ABNORMAL LOW (ref 36.0–46.0)
Hemoglobin: 8.4 g/dL — ABNORMAL LOW (ref 12.0–15.0)
MCH: 31 pg (ref 26.0–34.0)
MCHC: 30.5 g/dL (ref 30.0–36.0)
MCV: 101.5 fL — ABNORMAL HIGH (ref 80.0–100.0)
Platelets: 214 10*3/uL (ref 150–400)
RBC: 2.71 MIL/uL — ABNORMAL LOW (ref 3.87–5.11)
RDW: 15.6 % — ABNORMAL HIGH (ref 11.5–15.5)
WBC: 9.7 10*3/uL (ref 4.0–10.5)
nRBC: 0.5 % — ABNORMAL HIGH (ref 0.0–0.2)

## 2022-01-03 LAB — GLUCOSE, CAPILLARY
Glucose-Capillary: 182 mg/dL — ABNORMAL HIGH (ref 70–99)
Glucose-Capillary: 179 mg/dL — ABNORMAL HIGH (ref 70–99)

## 2022-01-03 LAB — URINALYSIS, ROUTINE W REFLEX MICROSCOPIC
Bilirubin Urine: NEGATIVE
Glucose, UA: 50 mg/dL — AB
Hgb urine dipstick: NEGATIVE
Ketones, ur: NEGATIVE mg/dL
Leukocytes,Ua: NEGATIVE
Nitrite: NEGATIVE
Protein, ur: NEGATIVE mg/dL
Specific Gravity, Urine: 1.012 (ref 1.005–1.030)
pH: 5 (ref 5.0–8.0)

## 2022-01-03 LAB — BASIC METABOLIC PANEL
Anion gap: 6 (ref 5–15)
BUN: 35 mg/dL — ABNORMAL HIGH (ref 8–23)
CO2: 37 mmol/L — ABNORMAL HIGH (ref 22–32)
Calcium: 10.2 mg/dL (ref 8.9–10.3)
Chloride: 95 mmol/L — ABNORMAL LOW (ref 98–111)
Creatinine, Ser: 1.15 mg/dL — ABNORMAL HIGH (ref 0.44–1.00)
GFR, Estimated: 52 mL/min — ABNORMAL LOW (ref >60)
Glucose, Bld: 172 mg/dL — ABNORMAL HIGH (ref 70–99)
Potassium: 4.7 mmol/L (ref 3.5–5.1)
Sodium: 138 mmol/L (ref 135–145)

## 2022-01-03 MED ORDER — MORPHINE SULFATE ER 15 MG PO TBCR
15.0000 mg | EXTENDED_RELEASE_TABLET | Freq: Two times a day (BID) | ORAL | Status: DC
  Administered 2022-01-04 – 2022-01-14 (×21): 15 mg via ORAL
  Filled 2022-01-03 (×22): qty 1

## 2022-01-03 MED ORDER — MORPHINE SULFATE (PF) 2 MG/ML IV SOLN: 2.0000 mg | Freq: Four times a day (QID) | INTRAVENOUS | Status: DC | PRN

## 2022-01-03 NOTE — Progress Notes (Signed)
Wound vac removed per MD order. Still awaiting orders for amputation site care. ? ?Haydee Salter, RN ? ?

## 2022-01-03 NOTE — Progress Notes (Signed)
Patient ID: Shannon [name], female   DOB: 04-06-1956, 66 y.o.   MRN: [id number] ? ?Attempted to remove wound vac dressing per order. Attempt was unsuccessful due to bandage adhering strongly to staples and incision. Reinforced dressing and notified MD. Consult placed to Wound/Ostomy. ? ?Haydee Salter, RN ? ?

## 2022-01-03 NOTE — Progress Notes (Incomplete Revision)
?[facility] Progress Note ? ?Patient: Shannon [name]     ?ZES:[id number]  ?DOA: 12/17/2021   ?PCP: [name], MD  ? ?  ?  ?Brief hospital course: ?Is a 66 year old female with metastatic lung adenocarcinoma, COPD, HIV, chronic diastolic heart failure, atrial flutter, diabetes mellitus and obesity who presented to the hospital with a right lower extremity abscess.  She was diagnosed with necrotizing fasciitis and underwent incision and drainage by orthopedic surgery.  She was later discovered to have fungemia.  Infectious disease was consulted and has been assisting with antibiotic and antifungal choices.  Her hospital course was complicated by respiratory failure and worsening mental status and she was intubated and transferred to the ICU to be treated for COPD exacerbation. ?She was extubated on 5/1.  She subsequently underwent a right AKA on 12/26/2021 for nonhealing necrotizing fasciitis with gangrene. ?Her mental status and COPD exacerbation have resolved. ?She has completed antibiotics and antifungals and is now awaiting placement. ? ?Subjective:  ?Very alert today but slightly confused. Complains of pain in her stump and arm again.  ? ?Assessment and Plan: ?Principal Problem: ?  Abscess of right foot, necrotizing fasciitis ?- Status post I&D on 12/20/2021 with OR culture revealing Klebsiella Proteus and Enterococcus ?- Status post right AKA on 5/5 ?- Completed Unasyn ?-Wound VAC to be removed at time of discharge ?- due to confusion, have reduced dose of Oxycodone and Neurontin today.  ?- has ongoing confusion today- repeat UA is negative, WBC count normal, using the BiPAP at night ?- extensive conversation with sister, Shannon [name] today ? ?Active Problems: ?  Fungemia ?-Suspected to be as a result of the wound infection ?- Diflucan to be completed on 5/15 ?- ID has signed off ?- Ophthalmology eval completed on 5/9-no eye involvement noted ? ?  Acute respiratory failure with hypoxia and hypercapnia  (HCC) ?COPD exacerbation ?- Failed BiPAP and required intubation ?- Exacerbation has resolved and she has been weaned off of oxygen ?-We being treated with albuterol nebs as needed, Brovana twice daily, Pulmicort twice daily, prednisone taper, revefenacin neb daily ?- start incentive spirometry ?- still requiring oxygen and often removes her oxygen despite being advised not to. ? ?Acute metabolic encephalopathy ?- persistent  ? ?  HIV disease ([place]) ?- Continue Biktarvy ?- CD4 count on 11/25/2021 was 143 ? ?DM 2 ?- sugars worsened by Decadron ?- A1c is 7.3 as of 12/17/21 ?- on Levemir and Novolog ? ?  Uncontrolled hypertension ?  (HFpEF) heart failure with preserved ejection fraction ([place]) ?-Currently receiving carvedilol 12.5 mg twice daily, diltiazem 360 mg, hydralazine 25 mg 3 times daily, spironolactone 25 mg daily  ?-Current medications include carvedilol, diltiazem, spironolactone and hydralazine IV ?  ? Morbid (severe) obesity due to excess calories ([place]) ?Body mass index is 39.2 kg/m?. ? ?  Atrial flutter ([place]) ?- Rate controlled on Cardizem and Coreg ?- cont Xarelto ? ?Acute blood loss anemia with macrocytosis ?- Status post 1 unit of packed red blood cell transfusion on 5/7 and a second unit on 5/28 ?- Hemoglobin has been stable since ? ? Adenocarcinoma of lung ([place]), recurrent metastatic non-small cell lung cancer ?- Status post curative radiotherapy to left upper lobe lung nodules-8/21 ?- sadly, she has had a recurrence as of Feb 2023> metastatic disease involving several areas of the bones including the ribs as well as thoracic spine and lumbar spine as well as sacrum.  She also has numerous small solid bilateral pulmonary nodules. ?- underwent CT-guided  core biopsy of the metastatic lesion of the posterior right ninth rib and the final pathology was consistent with adenocarcinoma of the lung. ?- Follows with Dr. Earlie Server ?- she missed an appointment last week and I have requested for her to be evaluated in  the hospital and receive another appointment ? ?Diarrhea ?- resolved- rectal tube  removed ? ?Thrombocytopenia ?-  resolved ? ?  ?DVT prophylaxis:  SCD's Start: 12/27/21 0843 ?rivaroxaban (XARELTO) tablet 20 mg   ?  Code Status: Full Code  ?Consultants: ortho, ID, opthal, critical care ?Level of Care: Level of care: Telemetry ?Disposition Plan:  ?Status is: Inpatient ?Remains inpatient appropriate because:confusion ? ?Objective: ?  ?Vitals:  ? 01/02/22 1745 01/02/22 2111 01/03/22 0430 01/03/22 0802  ?BP: 125/84 121/79 (!) 148/74   ?Pulse: 72 66 65   ?Resp:  14 14   ?Temp:  98.2 ?F (36.8 ?C) 98.7 ?F (37.1 ?C)   ?TempSrc:  Oral Oral   ?SpO2:  96% 98% 95%  ?Weight:      ?Height:      ? ?Filed Weights  ? 12/30/21 0332 12/31/21 0500 01/02/22 0500  ?Weight: 100.2 kg 104.1 kg 103.6 kg  ? ?Exam: ?General exam: Appears comfortable  ?HEENT: PERRLA, oral mucosa moist, no sclera icterus or thrush ?Respiratory system: Clear to auscultation. Respiratory effort normal. ?Cardiovascular system: S1 & S2 heard, regular rate and rhythm ?Gastrointestinal system: Abdomen soft, non-tender, nondistended. Normal bowel sounds   ?Central nervous system: Alert and oriented to person and place, not to time. No focal neurological deficits. ?Extremities: No cyanosis, clubbing - right arm edema ?Skin: ulcers noted on right arm ?Psychiatry:  Mood & affect appropriate.     ? ?Imaging and lab data was personally reviewed ? ? ? CBC: ?Recent Labs  ?Lab 12/28/21 ?0412 12/28/21 ?1620 12/29/21 ?0406 12/29/21 ?1738 12/30/21 ?8341 01/03/22 ?0450  ?WBC 5.9  --  6.2 7.0 5.0 9.7  ?NEUTROABS 4.9  --  5.0  --  3.9  --   ?HGB 6.7* 8.1* 7.0* 8.2* 8.2* 8.4*  ?HCT 22.1* 24.8* 22.2* 25.4* 25.9* 27.5*  ?MCV 104.2*  --  100.5* 100.0 100.0 101.5*  ?PLT 108*  --  126* 137* 148* 214  ? ? ?Basic Metabolic Panel: ?Recent Labs  ?Lab 12/28/21 ?0412 12/29/21 ?0406 12/30/21 ?9622 01/03/22 ?0450  ?NA 141 144 144 138  ?K 4.2 3.9 3.9 4.7  ?CL 104 104 103 95*  ?CO2 34* 36* 36* 37*   ?GLUCOSE 155* 143* 106* 172*  ?BUN 36* 41* 36* 35*  ?CREATININE 0.93 1.00 0.97 1.15*  ?CALCIUM 9.8 9.9 9.9 10.2  ?MG 1.8 1.7 1.9  --   ? ? ?GFR: ?Estimated Creatinine Clearance: 55.7 mL/min (A) (by C-G formula based on SCr of 1.15 mg/dL (H)). ? ?Scheduled Meds: ? (feeding supplement) PROSource Plus  30 mL Oral BID BM  ? arformoterol  15 mcg Nebulization BID  ? vitamin C  1,000 mg Oral Daily  ? bictegravir-emtricitabine-tenofovir AF  1 tablet Oral Daily  ? budesonide (PULMICORT) nebulizer solution  0.5 mg Nebulization BID  ? carvedilol  12.5 mg Oral BID WC  ? chlorhexidine  15 mL Mouth Rinse BID  ? Chlorhexidine Gluconate Cloth  6 each Topical Daily  ? dexamethasone  4 mg Oral BID WC  ? diltiazem  360 mg Oral Daily  ? docusate sodium  100 mg Oral Daily  ? famotidine  20 mg Oral Daily  ? feeding supplement  237 mL Oral BID BM  ? fluconazole  400 mg Oral Daily  ? hydrALAZINE  25 mg Oral TID  ? insulin aspart  0-15 Units Subcutaneous TID WC  ? insulin aspart  0-5 Units Subcutaneous QHS  ? insulin detemir  8 Units Subcutaneous Daily  ? mouth rinse  15 mL Mouth Rinse q12n4p  ? multivitamin with minerals  1 tablet Oral Daily  ? nutrition supplement (JUVEN)  1 packet Oral BID BM  ? pantoprazole  40 mg Oral Daily  ? revefenacin  175 mcg Nebulization Daily  ? rivaroxaban  20 mg Oral Q supper  ? sodium chloride flush  10-40 mL Intracatheter Q12H  ? spironolactone  25 mg Oral Daily  ? zinc sulfate  220 mg Oral Daily  ? ?Continuous Infusions: ? sodium chloride 0 mL/hr at 12/27/21 0426  ? ? ? LOS: 17 days  ? ?Author: ?Debbe Odea  ?01/03/2022 2:17 PM ?   ?

## 2022-01-03 NOTE — Progress Notes (Signed)
?[facility] Progress Note ? ?Patient: Shannon [name]     ?DVV:[id number]  ?DOA: 12/17/2021   ?PCP: [name], MD  ? ?  ?  ?Brief hospital course: ?Is a 66 year old female with metastatic lung adenocarcinoma, COPD, HIV, chronic diastolic heart failure, atrial flutter, diabetes mellitus and obesity who presented to the hospital with a right lower extremity abscess.  She was diagnosed with necrotizing fasciitis and underwent incision and drainage by orthopedic surgery.  She was later discovered to have fungemia.  Infectious disease was consulted and has been assisting with antibiotic and antifungal choices.  Her hospital course was complicated by respiratory failure and worsening mental status and she was intubated and transferred to the ICU to be treated for COPD exacerbation. ?She was extubated on 5/1.  She subsequently underwent a right AKA on 12/26/2021 for nonhealing necrotizing fasciitis with gangrene. ?Her mental status and COPD exacerbation have resolved. ?She has completed antibiotics and antifungals and is now awaiting placement. ? ?Subjective:  ?Very alert today but slightly confused. Complains of pain in her stump and arm again.  ? ?Assessment and Plan: ?Principal Problem: ?  Abscess of right foot, necrotizing fasciitis ?- Status post I&D on 12/20/2021 with OR culture revealing Klebsiella Proteus and Enterococcus ?- Status post right AKA on 5/5 ?- Completed Unasyn ?-Wound VAC to be removed at time of discharge ?- due to confusion, have reduced dose of Oxycodone and Neurontin today.  ?- has ongoing confusion today- repeat UA is negative, WBC count normal, using the BiPAP at night ? ?Active Problems: ?  Fungemia ?-Suspected to be as a result of the wound infection ?- Diflucan to be completed on 5/15 ?- ID has signed off ?- Ophthalmology eval completed on 5/9-no eye involvement noted ? ?  Acute respiratory failure with hypoxia and hypercapnia (HCC) ?COPD exacerbation ?- Failed BiPAP and required  intubation ?- Exacerbation has resolved and she has been weaned off of oxygen ?-We being treated with albuterol nebs as needed, Brovana twice daily, Pulmicort twice daily, prednisone taper, revefenacin neb daily ?- start incentive spirometry ?- still requiring oxygen and often removes her oxygen despite being advised not to. ? ?Acute metabolic encephalopathy ?- persistent  ? ?  HIV disease ([place]) ?- Continue Biktarvy ?- CD4 count on 11/25/2021 was 143 ? ?DM 2 ?- sugars worsened by Decadron ?- A1c is 7.3 as of 12/17/21 ?- on Levemir and Novolog ? ?  Uncontrolled hypertension ?  (HFpEF) heart failure with preserved ejection fraction ([place]) ?-Currently receiving carvedilol 12.5 mg twice daily, diltiazem 360 mg, hydralazine 25 mg 3 times daily, spironolactone 25 mg daily  ?-Current medications include carvedilol, diltiazem, spironolactone and hydralazine IV ?  ? Morbid (severe) obesity due to excess calories ([place]) ?Body mass index is 39.2 kg/m?. ? ?  Atrial flutter ([place]) ?- Rate controlled on Cardizem and Coreg ?- cont Xarelto ? ?Acute blood loss anemia with macrocytosis ?- Status post 1 unit of packed red blood cell transfusion on 5/7 and a second unit on 5/28 ?- Hemoglobin has been stable since ? ? Adenocarcinoma of lung ([place]), recurrent metastatic non-small cell lung cancer ?- Status post curative radiotherapy to left upper lobe lung nodules-8/21 ?- sadly, she has had a recurrence as of Feb 2023> metastatic disease involving several areas of the bones including the ribs as well as thoracic spine and lumbar spine as well as sacrum.  She also has numerous small solid bilateral pulmonary nodules. ?- underwent CT-guided core biopsy of the metastatic lesion of  the posterior right ninth rib and the final pathology was consistent with adenocarcinoma of the lung. ?- Follows with Dr. Earlie Server ?- she missed an appointment last week and I have requested for her to be evaluated in the hospital and receive another  appointment ? ?Diarrhea ?- resolved- rectal tube  removed ? ?Thrombocytopenia ?-  resolved ? ?  ?DVT prophylaxis:  SCD's Start: 12/27/21 0843 ?rivaroxaban (XARELTO) tablet 20 mg   ?  Code Status: Full Code  ?Consultants: ortho, ID, opthal, critical care ?Level of Care: Level of care: Telemetry ?Disposition Plan:  ?Status is: Inpatient ?Remains inpatient appropriate because:confusion ? ?Objective: ?  ?Vitals:  ? 01/02/22 1745 01/02/22 2111 01/03/22 0430 01/03/22 0802  ?BP: 125/84 121/79 (!) 148/74   ?Pulse: 72 66 65   ?Resp:  14 14   ?Temp:  98.2 ?F (36.8 ?C) 98.7 ?F (37.1 ?C)   ?TempSrc:  Oral Oral   ?SpO2:  96% 98% 95%  ?Weight:      ?Height:      ? ?Filed Weights  ? 12/30/21 0332 12/31/21 0500 01/02/22 0500  ?Weight: 100.2 kg 104.1 kg 103.6 kg  ? ?Exam: ?General exam: Appears comfortable  ?HEENT: PERRLA, oral mucosa moist, no sclera icterus or thrush ?Respiratory system: Clear to auscultation. Respiratory effort normal. ?Cardiovascular system: S1 & S2 heard, regular rate and rhythm ?Gastrointestinal system: Abdomen soft, non-tender, nondistended. Normal bowel sounds   ?Central nervous system: Alert and oriented to person and place, not to time. No focal neurological deficits. ?Extremities: No cyanosis, clubbing - right arm edema ?Skin: ulcers noted on right arm ?Psychiatry:  Mood & affect appropriate.     ? ?Imaging and lab data was personally reviewed ? ? ? CBC: ?Recent Labs  ?Lab 12/28/21 ?0412 12/28/21 ?1620 12/29/21 ?0406 12/29/21 ?1738 12/30/21 ?5284 01/03/22 ?0450  ?WBC 5.9  --  6.2 7.0 5.0 9.7  ?NEUTROABS 4.9  --  5.0  --  3.9  --   ?HGB 6.7* 8.1* 7.0* 8.2* 8.2* 8.4*  ?HCT 22.1* 24.8* 22.2* 25.4* 25.9* 27.5*  ?MCV 104.2*  --  100.5* 100.0 100.0 101.5*  ?PLT 108*  --  126* 137* 148* 214  ? ? ?Basic Metabolic Panel: ?Recent Labs  ?Lab 12/28/21 ?0412 12/29/21 ?0406 12/30/21 ?1324 01/03/22 ?0450  ?NA 141 144 144 138  ?K 4.2 3.9 3.9 4.7  ?CL 104 104 103 95*  ?CO2 34* 36* 36* 37*  ?GLUCOSE 155* 143* 106* 172*   ?BUN 36* 41* 36* 35*  ?CREATININE 0.93 1.00 0.97 1.15*  ?CALCIUM 9.8 9.9 9.9 10.2  ?MG 1.8 1.7 1.9  --   ? ? ?GFR: ?Estimated Creatinine Clearance: 55.7 mL/min (A) (by C-G formula based on SCr of 1.15 mg/dL (H)). ? ?Scheduled Meds: ? (feeding supplement) PROSource Plus  30 mL Oral BID BM  ? arformoterol  15 mcg Nebulization BID  ? vitamin C  1,000 mg Oral Daily  ? bictegravir-emtricitabine-tenofovir AF  1 tablet Oral Daily  ? budesonide (PULMICORT) nebulizer solution  0.5 mg Nebulization BID  ? carvedilol  12.5 mg Oral BID WC  ? chlorhexidine  15 mL Mouth Rinse BID  ? Chlorhexidine Gluconate Cloth  6 each Topical Daily  ? dexamethasone  4 mg Oral BID WC  ? diltiazem  360 mg Oral Daily  ? docusate sodium  100 mg Oral Daily  ? famotidine  20 mg Oral Daily  ? feeding supplement  237 mL Oral BID BM  ? fluconazole  400 mg Oral Daily  ? hydrALAZINE  25 mg Oral TID  ? insulin aspart  0-15 Units Subcutaneous TID WC  ? insulin aspart  0-5 Units Subcutaneous QHS  ? insulin detemir  8 Units Subcutaneous Daily  ? mouth rinse  15 mL Mouth Rinse q12n4p  ? multivitamin with minerals  1 tablet Oral Daily  ? nutrition supplement (JUVEN)  1 packet Oral BID BM  ? pantoprazole  40 mg Oral Daily  ? revefenacin  175 mcg Nebulization Daily  ? rivaroxaban  20 mg Oral Q supper  ? sodium chloride flush  10-40 mL Intracatheter Q12H  ? spironolactone  25 mg Oral Daily  ? zinc sulfate  220 mg Oral Daily  ? ?Continuous Infusions: ? sodium chloride 0 mL/hr at 12/27/21 0426  ? ? ? LOS: 17 days  ? ?Author: ?Debbe Odea  ?01/03/2022 2:17 PM ?   ?

## 2022-01-04 DIAGNOSIS — L02611 Cutaneous abscess of right foot: Secondary | ICD-10-CM | POA: Diagnosis not present

## 2022-01-04 DIAGNOSIS — A480 Gas gangrene: Secondary | ICD-10-CM | POA: Diagnosis not present

## 2022-01-04 DIAGNOSIS — J9602 Acute respiratory failure with hypercapnia: Secondary | ICD-10-CM | POA: Diagnosis not present

## 2022-01-04 DIAGNOSIS — L02619 Cutaneous abscess of unspecified foot: Secondary | ICD-10-CM | POA: Diagnosis not present

## 2022-01-04 LAB — BASIC METABOLIC PANEL
Anion gap: 7 (ref 5–15)
BUN: 39 mg/dL — ABNORMAL HIGH (ref 8–23)
CO2: 38 mmol/L — ABNORMAL HIGH (ref 22–32)
Calcium: 10.7 mg/dL — ABNORMAL HIGH (ref 8.9–10.3)
Chloride: 97 mmol/L — ABNORMAL LOW (ref 98–111)
Creatinine, Ser: 1.07 mg/dL — ABNORMAL HIGH (ref 0.44–1.00)
GFR, Estimated: 57 mL/min — ABNORMAL LOW (ref >60)
Glucose, Bld: 202 mg/dL — ABNORMAL HIGH (ref 70–99)
Potassium: 4.5 mmol/L (ref 3.5–5.1)
Sodium: 142 mmol/L (ref 135–145)

## 2022-01-04 LAB — GLUCOSE, CAPILLARY
Glucose-Capillary: 171 mg/dL — ABNORMAL HIGH (ref 70–99)
Glucose-Capillary: 183 mg/dL — ABNORMAL HIGH (ref 70–99)
Glucose-Capillary: 200 mg/dL — ABNORMAL HIGH (ref 70–99)
Glucose-Capillary: 254 mg/dL — ABNORMAL HIGH (ref 70–99)

## 2022-01-04 LAB — CULTURE, BLOOD (ROUTINE X 2): Special Requests: ADEQUATE

## 2022-01-04 NOTE — Progress Notes (Signed)
Pt wants to hold off on BIPAP QHS tonight.  Pt states that is overwhelming to her at times.  ?

## 2022-01-04 NOTE — Progress Notes (Addendum)
?[facility] Progress Note ? ?Patient: Shannon [name]     ?GEX:[id number]  ?DOA: 12/17/2021   ?PCP: [name], MD  ? ?  ?  ?Brief hospital course: ?Is a 66 year old female with metastatic lung adenocarcinoma, COPD, HIV, chronic diastolic heart failure, atrial flutter, diabetes mellitus and obesity who presented to the hospital with a right lower extremity abscess.  She was diagnosed with necrotizing fasciitis and underwent incision and drainage by orthopedic surgery.  She was later discovered to have fungemia.  Infectious disease was consulted and has been assisting with antibiotic and antifungal choices.  Her hospital course was complicated by respiratory failure and worsening mental status and she was intubated and transferred to the ICU to be treated for COPD exacerbation. ?She was extubated on 5/1.  She subsequently underwent a right AKA on 12/26/2021 for nonhealing necrotizing fasciitis with gangrene. ?Her mental status and COPD exacerbation have resolved. ?She has completed antibiotics and antifungals and is now awaiting placement. ? ?Subjective:  ?Pain is better controlled today. She wants to try to get out of bed. I discussed her MRI finding with her.  ? ?Assessment and Plan: ?Principal Problem: ?  Abscess of right foot, necrotizing fasciitis ?- Status post I&D on 12/20/2021 with OR culture revealing Klebsiella Proteus and Enterococcus ?- Status post right AKA on 5/5 ?- Completed Unasyn ?- acute onset confusion again on 5/12> due to confusion, have reduced dose of Oxycodone and Neurontin today.  ? - repeat UA is negative, WBC count normal, using the BiPAP at night ?- changed Oxycodone to Morphine and stopped Neurontin and today, mentation has improved back to normal- thankfully, pain is controlled on MS Contin and she is not using IV narcotics  ? ?Active Problems: ? ?New finding of subacute CVA ?-Due to confusion, MRI of the brain was obtained ?- Found to have a 1.6 cm evolving acute to early  subacute ischemic infarct in the right anterior temporal lobe without any associated hemorrhage or mass effect ?- neurology consulted and would like to obtain an MRI with contrast ? ?  Fungemia ?-Suspected to be as a result of the wound infection ?- Diflucan to be completed on 5/15 ?- ID has signed off ?- Ophthalmology eval completed on 5/9-no eye involvement noted ? ?  Acute respiratory failure with hypoxia and hypercapnia (HCC) ?COPD exacerbation ?- Failed BiPAP and required intubation ?- Exacerbation has resolved and she has been weaned off of oxygen ?-We being treated with albuterol nebs as needed, Brovana twice daily, Pulmicort twice daily, prednisone taper, revefenacin neb daily ?- start incentive spirometry ?- still requiring oxygen (likely atelectasis) and often removes her oxygen despite being advised not to. ? ?Acute metabolic encephalopathy ?- much improved- ? If related to Neurontin and Oxycodone ? ?  HIV disease ([place]) ?- Continue Biktarvy ?- CD4 count on 11/25/2021 was 143 ? ?DM 2 ?- sugars worsened by Decadron ?- A1c is 7.3 as of 12/17/21 ?- on Levemir and Novolog ? ?  Uncontrolled hypertension ?  (HFpEF) heart failure with preserved ejection fraction ([place]) ?-Currently receiving carvedilol 12.5 mg twice daily, diltiazem 360 mg, hydralazine 25 mg 3 times daily, spironolactone 25 mg daily  ?-Current medications include carvedilol, diltiazem, spironolactone and hydralazine IV ?  ? Morbid (severe) obesity due to excess calories ([place]) ?Body mass index is 37.88 kg/m?. ? ?  Atrial flutter ([place]) ?- Rate controlled on Cardizem and Coreg ?- cont Xarelto ? ?Acute blood loss anemia with macrocytosis ?- Status post 1 unit of packed  red blood cell transfusion on 5/7 and a second unit on 5/28 ?- Hemoglobin has been stable since ? ? Adenocarcinoma of lung ([place]), recurrent metastatic non-small cell lung cancer ?- Status post curative radiotherapy to left upper lobe lung nodules-8/21 ?- sadly, she has had a recurrence as  of Feb 2023> metastatic disease involving several areas of the bones including the ribs as well as thoracic spine and lumbar spine as well as sacrum.  She also has numerous small solid bilateral pulmonary nodules. ?- underwent CT-guided core biopsy of the metastatic lesion of the posterior right ninth rib and the final pathology was consistent with adenocarcinoma of the lung. ?- Follows with Dr. Earlie Server ?- she missed an appointment last week and I have requested for her to be evaluated in the hospital and receive another appointment ? ?Diarrhea ?- resolved- rectal tube  removed ? ?Thrombocytopenia ?-  resolved ? ?  ?DVT prophylaxis:  SCD's Start: 12/27/21 0843 ?rivaroxaban (XARELTO) tablet 20 mg   ?  Code Status: Full Code  ?Consultants: ortho, ID, opthal, critical care ?Level of Care: Level of care: Telemetry ?Disposition Plan:  ?Status is: Inpatient ?Remains inpatient appropriate because: Infarct ?Objective: ?  ?Vitals:  ? 01/03/22 2141 01/03/22 2254 01/04/22 0555 01/04/22 0759  ?BP: (!) 158/75  (!) 156/86   ?Pulse: 67 65 71   ?Resp: 14  14   ?Temp: 98.5 ?F (36.9 ?C)  97.9 ?F (36.6 ?C)   ?TempSrc: Oral  Oral   ?SpO2: 97% 96% 97% 97%  ?Weight:   100.1 kg   ?Height:      ? ?Filed Weights  ? 12/31/21 0500 01/02/22 0500 01/04/22 0555  ?Weight: 104.1 kg 103.6 kg 100.1 kg  ? ?Exam: ?General exam: Appears comfortable  ?HEENT: PERRLA, oral mucosa moist, no sclera icterus or thrush ?Respiratory system: Clear to auscultation. Respiratory effort normal. ?Cardiovascular system: S1 & S2 heard, regular rate and rhythm ?Gastrointestinal system: Abdomen soft, non-tender, nondistended. Normal bowel sounds   ?Central nervous system: Alert and oriented. No focal neurological deficits. ?Extremities: No cyanosis, clubbing -persistent right arm edema-right AKA ?Skin: No rashes or ulcers ?Psychiatry:  Mood & affect appropriate.     ? ?Imaging and lab data was personally reviewed ? ? ? CBC: ?Recent Labs  ?Lab 12/28/21 ?1620  12/29/21 ?0406 12/29/21 ?1738 12/30/21 ?2992 01/03/22 ?0450  ?WBC  --  6.2 7.0 5.0 9.7  ?NEUTROABS  --  5.0  --  3.9  --   ?HGB 8.1* 7.0* 8.2* 8.2* 8.4*  ?HCT 24.8* 22.2* 25.4* 25.9* 27.5*  ?MCV  --  100.5* 100.0 100.0 101.5*  ?PLT  --  126* 137* 148* 214  ? ? ?Basic Metabolic Panel: ?Recent Labs  ?Lab 12/29/21 ?0406 12/30/21 ?4268 01/03/22 ?0450 01/04/22 ?3419  ?NA 144 144 138 142  ?K 3.9 3.9 4.7 4.5  ?CL 104 103 95* 97*  ?CO2 36* 36* 37* 38*  ?GLUCOSE 143* 106* 172* 202*  ?BUN 41* 36* 35* 39*  ?CREATININE 1.00 0.97 1.15* 1.07*  ?CALCIUM 9.9 9.9 10.2 10.7*  ?MG 1.7 1.9  --   --   ? ? ?GFR: ?Estimated Creatinine Clearance: 58.7 mL/min (A) (by C-G formula based on SCr of 1.07 mg/dL (H)). ? ?Scheduled Meds: ? (feeding supplement) PROSource Plus  30 mL Oral BID BM  ? arformoterol  15 mcg Nebulization BID  ? vitamin C  1,000 mg Oral Daily  ? bictegravir-emtricitabine-tenofovir AF  1 tablet Oral Daily  ? budesonide (PULMICORT) nebulizer solution  0.5 mg Nebulization  BID  ? carvedilol  12.5 mg Oral BID WC  ? chlorhexidine  15 mL Mouth Rinse BID  ? Chlorhexidine Gluconate Cloth  6 each Topical Daily  ? dexamethasone  4 mg Oral BID WC  ? diltiazem  360 mg Oral Daily  ? docusate sodium  100 mg Oral Daily  ? famotidine  20 mg Oral Daily  ? feeding supplement  237 mL Oral BID BM  ? hydrALAZINE  25 mg Oral TID  ? insulin aspart  0-15 Units Subcutaneous TID WC  ? insulin aspart  0-5 Units Subcutaneous QHS  ? insulin detemir  8 Units Subcutaneous Daily  ? mouth rinse  15 mL Mouth Rinse q12n4p  ? morphine  15 mg Oral Q12H  ? multivitamin with minerals  1 tablet Oral Daily  ? nutrition supplement (JUVEN)  1 packet Oral BID BM  ? pantoprazole  40 mg Oral Daily  ? revefenacin  175 mcg Nebulization Daily  ? rivaroxaban  20 mg Oral Q supper  ? sodium chloride flush  10-40 mL Intracatheter Q12H  ? zinc sulfate  220 mg Oral Daily  ? ?Continuous Infusions: ? sodium chloride 0 mL/hr at 12/27/21 0426  ? ? ? LOS: 18 days  ? ?Author: ?Debbe Odea  ?01/04/2022 2:40 PM ?   ?

## 2022-01-04 NOTE — TOC Progression Note (Signed)
Transition of Care (TOC) - Progression Note  ? ? ?Patient Details  ?Name: KASHAYLA UNGERER ?MRN: [id number] ?Date of Birth: [birth year] ? ?Transition of Care (TOC) CM/SW Contact  ?Ross Ludwig, LCSW ?Phone Number: ?01/04/2022, 5:04 PM ? ?Clinical Narrative:    ? ?Insurance authorization valid through end of day on Monday May 15th.  Reference number [id number], if patient not medically ready on Monday will have to redo insurance authorization for [facility] through [employer]. ? ? ?Expected Discharge Plan: [place] ?Barriers to Discharge: Continued Medical Work up, SNF Pending bed offer ? ?Expected Discharge Plan and Services ?Expected Discharge Plan: [place] ?  ?Discharge Planning Services: CM Consult ?Post Acute Care Choice: [place] ?Living arrangements for the past 2 months: Apartment ?                ?  ?  ?  ?  ?  ?  ?  ?  ?  ?  ? ? ?Social Determinants of Health (SDOH) Interventions ?  ? ?Readmission Risk Interventions ? ?  12/22/2021  ?  9:44 AM  ?Readmission Risk Prevention Plan  ?Transportation Screening Complete  ?Medication Review [employer]) Complete  ?[place] or Home Care Consult Complete  ?SW Recovery Care/Counseling Consult Complete  ?Palliative Care Screening Not Applicable  ?Skilled Nursing Facility Complete  ? ? ?

## 2022-01-04 NOTE — Progress Notes (Incomplete)
?Radiation Oncology         ([phone]) [phone] ?________________________________ ? ?Name: Shannon [name] MRN: [id number]  ?Date: 01/05/2022  DOB: 1956-07-20 ? ?Follow-Up Visit Note ? ?Inpatient  ? ?CC: [name], MD  [name], MD ? ?No diagnosis found. ? ?Diagnosis:  Stage IA (T1c, N0, M0) non-small cell lung cancer of the left upper lobe, adenocarcinoma ?  ?New evidence of bony metastases; ribs, thoracic spine, sacral spine ? ?Interval Since Last Radiation:  2 months and 8 days  ? ?Intent: Palliative ? ?Radiation Treatment Dates: 10/08/2021 through 10/28/2021 ?Site Technique Total Dose (Gy) Dose per Fx (Gy) Completed Fx Beam Energies  ?Lumbar Spine: Spine 3D 35/35 2.5 14/14 10X  ? ?Radiation Treatment Dates: 04/09/2020 through 04/22/2020 ?Site Technique Total Dose (Gy) Dose per Fx (Gy) Completed Fx Beam Energies  ?Lung, Left: Lung_Lt SBRT 60/60 12 5/5 6XFFF  ? ?Narrative:  The patient returns today for routine follow-up.  The patient tolerated radiation therapy relatively well. During her final weekly treatment check on 10/27/21, the patient reported mid back pain, lower back pain, throat pain, fatigue, numbness if her feet and ankles, pedal edema, photosensitivity, a "fluid sac" on the back of her head, and sore throat. Overall her pain in the lower lumbar spine and upper sacrum area improved significantly near the end of RT.  She did have some nausea and I encouraged her to use her Zofran for this issue.  She also inquired as to whether her sore throat and photosensitivity was related to radiation treatments and I discussed with her that her radiation would not cause these symptoms, since we treated a different area of the body.     ? ?PET scan performed on 10/16/21 showed several hypermetabolic lytic osseous metastasis to the bilateral ribs, thoracic spine and sacrum all new since 01/25/2020 PET scan.  The treated index pulmonary nodule in the left upper lobe was also seen, and appeared to have significantly  decreased in size and metabolism.  Numerous additional small solid bilateral pulmonary nodules scattered throughout both lungs were also appreciated, all new since baseline PET scan on 01/25/2020. The largest of which measuring 0.5 cm in the right upper lobe.      ? ?While undergoing RT, I referred the patient to Dr. Julien Nordmann on 10/23/21 for further management regarding the above PET findings. During this visit, the patient continued to complain of the pain in the lower back as well as the left and right side of the chest. Following review and discussion, Dr. Julien Nordmann recommended for the patient to have CT-guided core biopsy of one of the lytic lesions in the sacral area, or any of the other accessible lytic bone lesions, for confirmation of her disease recurrence.    ? ?MRI of the brain on 11/03/21 showed expansile bony metastasis at the right skull base/clivus. No evidence of intracranial metastatic disease was appreciated. (The patient declined IV contrast).    ? ?After completing RT, the patient again followed up with Dr. Julien Nordmann on 11/06/21. The patient was supposed to have a bone lesion biopsy prior to this visit but canceled this because she was not feeling well. During this visit, the patient denied having any current chest pain but reported an increase in her baseline shortness of breath with exertion with mild cough and no hemoptysis.  She also endorsed occasional nausea but denied taking her antiemetics. ? ?The patient was able to reschedule her biopsy for 11/20/21. Biopsy of the right posterior rib lesion collected  revealed metastatic well differentiated adenocarcinoma consistent with lung cancer primary.        ? ?Accordingly, the patient returned to Dr. Julien Nordmann on 11/27/21 to discuss further treatment options. During this visit, the patient mentioned that her immune system is low because of the HIV and her CD4 count had been low recently.  That being said, she stated that she does not want to start  anything anytime soon until her count is better. (Dr. Julien Nordmann did give her the options of palliative care and hospice, versus consideration of palliative systemic therapy either with targeted therapy if the molecular studies showed an actionable mutation, or a combination of systemic chemotherapy with immunotherapy if she has no actionable mutations). ? ?The patient presented to the ED on 12/02/21 with shortness of breath. To review, the patient is chronically short of breath and wears oxygen 2 L at baseline. The patient also reported onset of some right lower extremity swelling x 2 weeks, and stated that she often forgets to take her medications including her Lasix, Xarelto, and diltiazem.  She otherwise denied any chest pain. Following evaluation, he symptoms were noted as likely related to noncompliance with her medications. However, labs were notable for abnormally low TSH, elevated D-dimer, and she was found to be tachycardic which improved with her diltiazem. CTA of the chest showed no evidence of PE, and re-demonstrated her locally recurrent left upper lobe lung cancer with widespread metastatic disease to the bones. CT also showed dilatation of the pulmonic trunk (4.2 cm in diameter), concerning for pulmonary arterial hypertension. RLE DVT study performed was also negative. She was discharged home with instructions for OP cardiology follow up. She met with her cardiologist yesterday though encounter notes are pending at this time.  ? ?On 12/17/21, the patient followed up with palliative medicine. During this visit, the patient was found to have severe right LE edema and a concerning open area on the top of her foot. Accordingly, the patient was taken to the ED and admitted for further management. Per encounter notes, the patient reported having a small blister on the dorsum of her right foot about 3 to 4 weeks ago. (Per the patient, she was given multiple antibiotics when seen in the ED on 04/11).  Ultimately, the patient was diagnosed with a right foot abscess and possible necrotizing fasciitis based on the CT of the lower extremity.  Attending ED physician consulted with orthopedics who recommended admission under hospitalist service, and the patient proceeded to undergo a right AKA on 12/26/21. She remains inpatient at this time.  ? ? ?Allergies:  is allergic to lisinopril, tree extract, augmentin [amoxicillin-pot clavulanate], and ciprofloxacin. ? ?Meds: ?No current facility-administered medications for this encounter.  ? ?No current outpatient medications on file.  ? ?Facility-Administered Medications Ordered in Other Encounters  ?Medication Dose Route Frequency Provider Last Rate Last Admin  ? (feeding supplement) PROSource Plus liquid 30 mL  30 mL Oral BID BM Newt Minion, MD   30 mL at 01/04/22 1615  ? 0.9 %  sodium chloride infusion   Intravenous PRN Newt Minion, MD 0 mL/hr at 12/27/21 0426 Restarted at 12/27/21 0426  ? acetaminophen (TYLENOL) suppository 650 mg  650 mg Rectal Q6H PRN Newt Minion, MD      ? acetaminophen (TYLENOL) tablet 325-650 mg  325-650 mg Oral Q6H PRN Newt Minion, MD      ? albuterol (PROVENTIL) (2.5 MG/3ML) 0.083% nebulizer solution 2.5 mg  2.5 mg Nebulization Q6H PRN Sharol Given,  Illene Regulus, MD   2.5 mg at 12/20/21 1509  ? alum & mag hydroxide-simeth (MAALOX/MYLANTA) 200-200-20 MG/5ML suspension 15-30 mL  15-30 mL Oral Q2H PRN Newt Minion, MD      ? arformoterol [facility]) nebulizer solution 15 mcg  15 mcg Nebulization BID Newt Minion, MD   15 mcg at 01/04/22 1950  ? ascorbic acid (VITAMIN C) tablet 1,000 mg  1,000 mg Oral Daily Newt Minion, MD   1,000 mg at 01/04/22 1206  ? bictegravir-emtricitabine-tenofovir AF (BIKTARVY) 50-200-25 MG per tablet 1 tablet  1 tablet Oral Daily Newt Minion, MD   1 tablet at 01/04/22 0754  ? bisacodyl (DULCOLAX) EC tablet 5 mg  5 mg Oral Daily PRN Newt Minion, MD      ? budesonide (PULMICORT) nebulizer solution 0.5 mg  0.5 mg  Nebulization BID Newt Minion, MD   0.5 mg at 01/04/22 1950  ? carvedilol (COREG) tablet 12.5 mg  12.5 mg Oral BID WC Newt Minion, MD   12.5 mg at 01/04/22 1631  ? chlorhexidine (PERIDEX) 0.12 % solution 15 mL  15 mL

## 2022-01-05 ENCOUNTER — Inpatient Hospital Stay (HOSPITAL_COMMUNITY): Payer: Medicare Other

## 2022-01-05 ENCOUNTER — Telehealth: Payer: Self-pay

## 2022-01-05 ENCOUNTER — Ambulatory Visit
Admission: RE | Admit: 2022-01-05 | Discharge: 2022-01-05 | Disposition: A | Discharge status: Home / Self Care | Payer: Medicare Other | Source: Ambulatory Visit | Attending: Radiation Oncology | Admitting: Radiation Oncology

## 2022-01-05 ENCOUNTER — Encounter (HOSPITAL_COMMUNITY): Payer: Self-pay | Admitting: Orthopedic Surgery

## 2022-01-05 ENCOUNTER — Telehealth: Payer: Self-pay | Admitting: *Deleted

## 2022-01-05 DIAGNOSIS — L02619 Cutaneous abscess of unspecified foot: Secondary | ICD-10-CM | POA: Diagnosis not present

## 2022-01-05 DIAGNOSIS — G939 Disorder of brain, unspecified: Secondary | ICD-10-CM

## 2022-01-05 DIAGNOSIS — L02611 Cutaneous abscess of right foot: Secondary | ICD-10-CM | POA: Diagnosis not present

## 2022-01-05 DIAGNOSIS — A480 Gas gangrene: Secondary | ICD-10-CM | POA: Diagnosis not present

## 2022-01-05 DIAGNOSIS — J9602 Acute respiratory failure with hypercapnia: Secondary | ICD-10-CM | POA: Diagnosis not present

## 2022-01-05 LAB — BASIC METABOLIC PANEL
Anion gap: 6 (ref 5–15)
BUN: 44 mg/dL — ABNORMAL HIGH (ref 8–23)
CO2: 38 mmol/L — ABNORMAL HIGH (ref 22–32)
Calcium: 10.3 mg/dL (ref 8.9–10.3)
Chloride: 92 mmol/L — ABNORMAL LOW (ref 98–111)
Creatinine, Ser: 1.06 mg/dL — ABNORMAL HIGH (ref 0.44–1.00)
GFR, Estimated: 58 mL/min — ABNORMAL LOW (ref >60)
Glucose, Bld: 174 mg/dL — ABNORMAL HIGH (ref 70–99)
Potassium: 4.5 mmol/L (ref 3.5–5.1)
Sodium: 136 mmol/L (ref 135–145)

## 2022-01-05 LAB — GLUCOSE, CAPILLARY
Glucose-Capillary: 152 mg/dL — ABNORMAL HIGH (ref 70–99)
Glucose-Capillary: 164 mg/dL — ABNORMAL HIGH (ref 70–99)
Glucose-Capillary: 156 mg/dL — ABNORMAL HIGH (ref 70–99)
Glucose-Capillary: 172 mg/dL — ABNORMAL HIGH (ref 70–99)
Glucose-Capillary: 138 mg/dL — ABNORMAL HIGH (ref 70–99)

## 2022-01-05 MED ORDER — GADOBUTROL 1 MMOL/ML IV SOLN
10.0000 mL | Freq: Once | INTRAVENOUS | Status: AC | PRN
  Administered 2022-01-05: 10 mL via INTRAVENOUS

## 2022-01-05 NOTE — Progress Notes (Signed)
?[facility] Progress Note ? ?Patient: Shannon [name]     ?ATF:[id number]  ?DOA: 12/17/2021   ?PCP: [name], MD  ? ?  ?  ?Brief hospital course: ?Is a 66 year old female with metastatic lung adenocarcinoma, COPD, HIV, chronic diastolic heart failure, atrial flutter, diabetes mellitus and obesity who presented to the hospital with a right lower extremity abscess.  She was diagnosed with necrotizing fasciitis and underwent incision and drainage by orthopedic surgery.  She was later discovered to have fungemia.  Infectious disease was consulted and has been assisting with antibiotic and antifungal choices.  Her hospital course was complicated by respiratory failure and worsening mental status and she was intubated and transferred to the ICU to be treated for COPD exacerbation. ?She was extubated on 5/1.  She subsequently underwent a right AKA on 12/26/2021 for nonhealing necrotizing fasciitis with gangrene. ?Her mental status and COPD exacerbation have resolved. ?She has completed antibiotics and antifungals and is now awaiting placement. ? ?Subjective:  ?Pain controlled. She has no other complaints. O2 is off again and she does not remember taking it off.  ? ?Assessment and Plan: ?Principal Problem: ?  Abscess of right foot, necrotizing fasciitis ?- Status post I&D on 12/20/2021 with OR culture revealing Klebsiella Proteus and Enterococcus ?- Status post right AKA on 5/5 ?- Completed Unasyn ?- acute onset confusion again on 5/12> due to confusion, have reduced dose of Oxycodone and Neurontin today.  ? - repeat UA is negative, WBC count normal, using the BiPAP at night ?- changed Oxycodone to Morphine and stopped Neurontin on 5/13 ?- mentation has improved back to normal- thankfully, pain is controlled on MS Contin and she is not using IV narcotics  ?- Wound vac removed- wound is clean ? ? ?Active Problems: ? ?Right forearm fascial biopsy and fasciotomy ?- suspicion of infected arm-infection ruled out-  biopsy revealed dense fibroadipose tissue ?- staples intact but noted to be oozing today- d/w RN to do dressing and maintain elevation- notified Dr Caralyn Guile who recommends to continue dressings ? ?New finding of subacute CVA ?-Due to confusion, MRI of the brain was obtained ?- Found to have a 1.6 cm evolving acute to early subacute ischemic infarct in the right anterior temporal lobe without any associated hemorrhage or mass effect ?- neurology consulted and would like to obtain an MRI with contrast- still waiting on this ? ?  Fungemia ?-Suspected to be as a result of the wound infection ?- Diflucan to be completed on 5/15 ?- ID has signed off ?- Ophthalmology eval completed on 5/9-no eye involvement noted ? ?  Acute respiratory failure with hypoxia and hypercapnia (HCC) ?COPD exacerbation ?- Failed BiPAP and required intubation ?- Exacerbation has resolved and she has been weaned off of oxygen ?-We being treated with albuterol nebs as needed, Brovana twice daily, Pulmicort twice daily, prednisone taper, revefenacin neb daily ?- start incentive spirometry ?- still requiring oxygen (likely atelectasis) and often removes her oxygen despite being advised not to. ? ?Acute metabolic encephalopathy ?- much improved-   ? ?  HIV disease ([place]) ?- Continue Biktarvy ?- CD4 count on 11/25/2021 was 143 ? ?DM 2 ?- sugars worsened by Decadron ?- A1c is 7.3 as of 12/17/21 ?- on Levemir and Novolog ? ?  Uncontrolled hypertension ?  (HFpEF) heart failure with preserved ejection fraction ([place]) ?-Currently receiving carvedilol 12.5 mg twice daily, diltiazem 360 mg, hydralazine 25 mg 3 times daily, spironolactone 25 mg daily  ?-Current medications include carvedilol, diltiazem, spironolactone  and hydralazine IV ?  ? Morbid (severe) obesity due to excess calories ([place]) ?Body mass index is 37.88 kg/m?. ? ?  Atrial flutter ([place]) ?- Rate controlled on Cardizem and Coreg ?- cont Xarelto ? ?Acute blood loss anemia with macrocytosis ?- Status post  1 unit of packed red blood cell transfusion on 5/7 and a second unit on 5/28 ?- Hemoglobin has been stable since ? ? Adenocarcinoma of lung ([place]), recurrent metastatic non-small cell lung cancer ?- Status post curative radiotherapy to left upper lobe lung nodules-8/21 ?- sadly, she has had a recurrence as of Feb 2023> metastatic disease involving several areas of the bones including the ribs as well as thoracic spine and lumbar spine as well as sacrum.  She also has numerous small solid bilateral pulmonary nodules. ?- underwent CT-guided core biopsy of the metastatic lesion of the posterior right ninth rib and the final pathology was consistent with adenocarcinoma of the lung. ?- Follows with Dr. Earlie Server ?- she missed an appointment last week and I have requested for her to be evaluated in the hospital and receive another appointment ? ?Diarrhea ?- resolved- rectal tube  removed ? ?Thrombocytopenia ?-  resolved ? ?  ?DVT prophylaxis:  SCD's Start: 12/27/21 0843 ?rivaroxaban (XARELTO) tablet 20 mg   ?  Code Status: Full Code  ?Consultants: ortho, ID, opthal, critical care ?Level of Care: Level of care: Telemetry ?Disposition Plan:  ?Status is: Inpatient ?Remains inpatient appropriate because: Infarct ?Objective: ?  ?Vitals:  ? 01/05/22 0540 01/05/22 0729 01/05/22 0742 01/05/22 1106  ?BP: (!) 176/93  (!) 152/73 (!) 142/60  ?Pulse: 62  73   ?Resp: 14     ?Temp: 97.9 ?F (36.6 ?C)     ?TempSrc: Oral     ?SpO2: 99% 98%    ?Weight: 100.1 kg     ?Height:      ? ?Filed Weights  ? 01/02/22 0500 01/04/22 0555 01/05/22 0540  ?Weight: 103.6 kg 100.1 kg 100.1 kg  ? ?Exam: ?General exam: Appears comfortable  ?HEENT: PERRLA, oral mucosa moist, no sclera icterus or thrush ?Respiratory system: Clear to auscultation. Respiratory effort normal. ?Cardiovascular system: S1 & S2 heard, regular rate and rhythm ?Gastrointestinal system: Abdomen soft, non-tender, nondistended. Normal bowel sounds   ?Central nervous system: Alert and  oriented. No focal neurological deficits. ?Extremities: No cyanosis, clubbing - Rt AKA, right forearm oozing blood from incision- still quite swollen ?Skin: No rashes or ulcers ?Psychiatry:  Mood & affect appropriate.   ? ?Imaging and lab data was personally reviewed ? ? ? CBC: ?Recent Labs  ?Lab 12/29/21 ?1738 12/30/21 ?0353 01/03/22 ?0450  ?WBC 7.0 5.0 9.7  ?NEUTROABS  --  3.9  --   ?HGB 8.2* 8.2* 8.4*  ?HCT 25.4* 25.9* 27.5*  ?MCV 100.0 100.0 101.5*  ?PLT 137* 148* 214  ? ? ?Basic Metabolic Panel: ?Recent Labs  ?Lab 12/30/21 ?6962 01/03/22 ?0450 01/04/22 ?9528 01/05/22 ?0500  ?NA 144 138 142 136  ?K 3.9 4.7 4.5 4.5  ?CL 103 95* 97* 92*  ?CO2 36* 37* 38* 38*  ?GLUCOSE 106* 172* 202* 174*  ?BUN 36* 35* 39* 44*  ?CREATININE 0.97 1.15* 1.07* 1.06*  ?CALCIUM 9.9 10.2 10.7* 10.3  ?MG 1.9  --   --   --   ? ? ?GFR: ?Estimated Creatinine Clearance: 59.3 mL/min (A) (by C-G formula based on SCr of 1.06 mg/dL (H)). ? ?Scheduled Meds: ? (feeding supplement) PROSource Plus  30 mL Oral BID BM  ? arformoterol  15  mcg Nebulization BID  ? vitamin C  1,000 mg Oral Daily  ? bictegravir-emtricitabine-tenofovir AF  1 tablet Oral Daily  ? budesonide (PULMICORT) nebulizer solution  0.5 mg Nebulization BID  ? carvedilol  12.5 mg Oral BID WC  ? chlorhexidine  15 mL Mouth Rinse BID  ? Chlorhexidine Gluconate Cloth  6 each Topical Daily  ? dexamethasone  4 mg Oral BID WC  ? diltiazem  360 mg Oral Daily  ? docusate sodium  100 mg Oral Daily  ? famotidine  20 mg Oral Daily  ? feeding supplement  237 mL Oral BID BM  ? hydrALAZINE  25 mg Oral TID  ? insulin aspart  0-15 Units Subcutaneous TID WC  ? insulin aspart  0-5 Units Subcutaneous QHS  ? insulin detemir  8 Units Subcutaneous Daily  ? mouth rinse  15 mL Mouth Rinse q12n4p  ? morphine  15 mg Oral Q12H  ? multivitamin with minerals  1 tablet Oral Daily  ? nutrition supplement (JUVEN)  1 packet Oral BID BM  ? pantoprazole  40 mg Oral Daily  ? revefenacin  175 mcg Nebulization Daily  ?  rivaroxaban  20 mg Oral Q supper  ? sodium chloride flush  10-40 mL Intracatheter Q12H  ? zinc sulfate  220 mg Oral Daily  ? ?Continuous Infusions: ? sodium chloride 0 mL/hr at 12/27/21 0426  ? ? ? LOS: 19 days  ? ?Aut

## 2022-01-05 NOTE — Progress Notes (Signed)
Physical Therapy Treatment ?Patient Details ?Name: Shannon [name] ?MRN: [id number] ?DOB: [birth year] ?Today's Date: 01/05/2022 ? ? ?History of Present Illness Zoila Ditullio is a 66 y.o. woman with metastatic non-small cell lung cancer, asthma/COPD, GERD, HTN, and HIV who was admitted 12/17/21  due to right lower extremity wound  S/P irrigation and debridement of right foot and calf wounds on 12/17/21 .  On 4/28 she developed swelling of her right upper extremity and went for incision and drainage morning of 4/29 without overt signs of necrotizing fasciitis. She developed progressive mental status changes 4/28 to 4/29 with obtundation. She was transferred to the ICU and PCCM consulted for hypercapnic respiratory failure.  Intubated 4/29 and extubated 12/22/21. Pt underwent Rt AKA on 12/26/21. ? ?  ?PT Comments  ? ? POD # 10 ?General Comments: AxO x 2.5 poor past few days recall but following all commands and able to share Hx.  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  ?General bed mobility comments: side to side rolling pt does well using rails as Therapist placed [place] pad under her.  Pt also able to long sit which was very helpful. ?General transfer comment: used Maxi Move Lift to assist pt OOB to recliner ?Positioned in recliner to comfort with multiple pillows ?Pt very appreciative to get OOB.   ?Pt will need ST Rehab at SNF to address her mobility and increase her level of Indep.   ?Recommendations for follow up therapy are one component of a multi-disciplinary discharge planning process, led by the attending physician.  Recommendations may be updated based on patient status, additional functional criteria and insurance authorization. ? ?Follow Up Recommendations ? Skilled nursing-short term rehab (<3 hours/day) ?  ?  ?Assistance Recommended at Discharge Frequent or constant Supervision/Assistance  ?Patient can return home with the following Two people to help with walking and/or transfers;Assistance with cooking/housework;Assist  for transportation;Help with stairs or ramp for entrance;Two people to help with bathing/dressing/bathroom;Direct supervision/assist for medications management ?  ?Equipment Recommendations ? Wheelchair cushion (measurements PT);Wheelchair (measurements PT)  ?  ?Recommendations for Other Services   ? ? ?  ?Precautions / Restrictions Precautions ?Precautions: Fall ?Precaution Comments: R AKA ?Required Braces or Orthoses: Other Brace ?Splint/Cast: none ?Restrictions ?Weight Bearing Restrictions: Yes ?RLE Weight Bearing: Non weight bearing ?Other Position/Activity Restrictions: New R AKA  ?  ? ?Mobility ? Bed Mobility ?Overal bed mobility: Needs Assistance ?Bed Mobility: Rolling, Supine to Sit ?Rolling: Mod assist ?  ?Supine to sit: Mod assist ?  ?  ?General bed mobility comments: side to side rolling pt does well using rails as Therapist placed [place] pad under her.  Pt also able to long sit which was very helpful. ?  ? ?Transfers ?Overall transfer level: Needs assistance ?  ?Transfers: Bed to chair/wheelchair/BSC ?  ?  ?  ?  ?  ?  ?General transfer comment: used Maxi Move Lift to assist pt OOB to recliner ?Transfer via Lift Equipment: [place] ? ?Ambulation/Gait ?  ?  ?  ?  ?  ?  ?  ?General Gait Details: currently non amb NEW AKA ? ? ?Stairs ?  ?  ?  ?  ?  ? ? ?Wheelchair Mobility ?  ? ?Modified Rankin (Stroke Patients Only) ?  ? ? ?  ?Balance   ?  ?  ?  ?  ?  ?  ?  ?  ?  ?  ?  ?  ?  ?  ?  ?  ?  ?  ?  ? ?  ?  Cognition Arousal/Alertness: Awake/alert ?Behavior During Therapy: [facility] for tasks assessed/performed ?Overall Cognitive Status: Within Functional Limits for tasks assessed ?  ?  ?  ?  ?  ?  ?  ?  ?  ?  ?  ?  ?  ?  ?  ?  ?General Comments: AxO x 2.5 poor past few days recall but following all commands and able to share Hx.  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 ?  ?  ? ?  ?Exercises   ? ?  ?General Comments   ?  ?  ? ?Pertinent Vitals/Pain Pain Assessment ?Pain Assessment: Faces ?Faces Pain Scale: Hurts little  more ?Pain Location: R LE "Phamtom" pain, acky ?Pain Descriptors / Indicators: Discomfort, Grimacing, Guarding ?Pain Intervention(s): Monitored during session, Repositioned  ? ? ?Home Living   ?  ?  ?  ?  ?  ?  ?  ?  ?  ?   ?  ?Prior Function    ?  ?  ?   ? ?PT Goals (current goals can now be found in the care plan section) Progress towards PT goals: Progressing toward goals ? ?  ?Frequency ? ? ? Min 3X/week ? ? ? ?  ?PT Plan Current plan remains appropriate  ? ? ?Co-evaluation   ?  ?  ?  ?  ? ?  ?AM-PAC PT "6 Clicks" Mobility   ?Outcome Measure ? Help needed turning from your back to your side while in a flat bed without using bedrails?: A Lot ?Help needed moving from lying on your back to sitting on the side of a flat bed without using bedrails?: A Lot ?Help needed moving to and from a bed to a chair (including a wheelchair)?: Total ?Help needed standing up from a chair using your arms (e.g., wheelchair or bedside chair)?: Total ?Help needed to walk in hospital room?: Total ?Help needed climbing 3-5 steps with a railing? : Total ?6 Click Score: 8 ? ?  ?End of Session Equipment Utilized During Treatment: Gait belt;Oxygen ?Activity Tolerance: Patient tolerated treatment well ?Patient left: in chair;with call bell/phone within reach ?Nurse Communication: Mobility status;Need for lift equipment ?PT Visit Diagnosis: Unsteadiness on feet (R26.81);Pain ?Pain - Right/Left: Right ?Pain - part of body: Leg ?  ? ? ?Time: 1111-1140 ?PT Time Calculation (min) (ACUTE ONLY): 29 min ? ?Charges:  $Therapeutic Activity: 23-37 mins          ?          ?Rica Koyanagi  PTA ?Acute  Rehabilitation Services ?Pager      [phone] ?Office      [phone] ? ?

## 2022-01-05 NOTE — Progress Notes (Addendum)
Loud noise heard coming from patient's room. Multiple staff members rushed to room to find the patient sitting on the floor, no oxygen in place, stating she fell. Patient appeared to be in no distress with no obvious injuries. She was assisted back to be with hoyer lift. Patient denies hitting her head or any injury. Head to toe assessment completed with no changes. Right arm dressing taken down and assessed with Lolita Cram, RN. No apparent changes in her right arm wound or sutures. NP J. Olena Heckle notified. Patient refuses to allow staff to notify her sister. ?

## 2022-01-05 NOTE — Progress Notes (Signed)
Pt refusing BIPAP at this time.  ?

## 2022-01-05 NOTE — Consult Note (Addendum)
?Neurology Consultation   ? ?Reason for Consult: New right anterior temporal lobe lesion concerning for possible right temporal lobe ischemic stroke versus metastasis, found incidentally on MRI during AMS workup  ? ?CC: "I feel better today"  ? ?HISTORY OF PRESENT ILLNESS   ?HPI  ?[name] is a 66 year old female with metastatic lung adenocarcinoma s/p curative radiotherapy 03/2020 but with recurrence 09/2021, COPD, HIV, CKD Stage IIIa, chronic diastolic heart failure, atrial fibrillation/flutter, diabetes mellitus and obesity who presented to the hospital with a right lower extremity abscess 12/17/2021.  She was diagnosed with necrotizing fasciitis and underwent incision and drainage by orthopedic surgery.  She was later discovered to have fungemia.  Infectious disease was consulted and has been assisting with antibiotic and antifungal choices.  Her hospital course was complicated by respiratory failure and worsening mental status and she was intubated and transferred to the ICU to be treated for COPD exacerbation.She was extubated on 5/1.  She subsequently underwent a right AKA on 12/26/2021 for nonhealing necrotizing fasciitis with gangrene. Her COPD exacerbation has resolved. She has completed antibiotics and antifungals and is now awaiting placement. ? ?She has continued to be intermittently confused since 4/29 during this admission, thought to be secondary to delirium overlapping with polypharmacy for pain.  ? ?On 5/13, brain MRI was ordered for this confusion, mainly characterized by diminished attention span, which showed a non-specific R anterior temporal lobe lesion with restricted diffusion on DWI imaging, for which neurology is consulted.  ? ?History is obtained from:patient, who is currently a bit inattentive but Aaox3 and with intact comprehension and expression. She does not have insight into the confusion aside from saying "It's no surprise, with all these different people in and out of here". She has no  current complaints. ? ?The patient denies headache, dizziness, visual changes, problems with swallowing or speech, focal muscle weakness, numbness or tingling of her extremities, abnormal movements, or other focal neurological deficits. ? ?ROS: Full ROS was performed and is negative except as noted in the HPI.  ? ?PAST MEDICAL HISTORY   ? ?Past Medical History:  ?Past Medical History:  ?Diagnosis Date  ? Anemia   ? Anxiety   ? HX PANIC ATTACKS  ? Arthritis   ? "starting to; in my hands" (07/09/2015)  ? Asthma   ? Atrial fibrillation ([place])   ? Atrial flutter, paroxysmal ([place])   ? Bloated abdomen   ? CFS (chronic fatigue syndrome)   ? Chewing difficulty   ? Chronic asthma with acute exacerbation   ? "I have chronic asthma all the time; sometimes exacerbations" (07/09/2015)  ? Chronic lower back pain   ? COPD (chronic obstructive pulmonary disease) ([place])   ? Cyst of right kidney   ? "3 of them; dx'd in ~ 01/2015"  ? Dyspnea   ? GERD (gastroesophageal reflux disease)   ? Heart murmur   ? History of blood transfusion   ? "related to my brain surgery I think"  ? History of pulmonary embolism 07/09/2015  ? History of radiation therapy 04/09/20-04/22/20  ? SBRT Left Lung, Dr. Gery Pray  ? History of radiation therapy   ? lumbar spine 10/08/2021-10/28/2021  Dr Gery Pray  ? HIV disease ([place])   ? Hyperlipidemia   ? Hypertension   ? Leg edema   ? Lipodystrophy   ? Mild CAD 2013  ? Multiple thyroid nodules   ? Osteopenia   ? Palpitations   ? Pneumonia 07/09/2015  ? Shingles   ? Vitamin  B 12 deficiency   ? Vitamin D deficiency   ? ? ?No family history on file. ?Family History  ?Problem Relation Age of Onset  ? Asthma Mother   ? Heart failure Mother   ?     cardiomyopathy  ? Thyroid disease Mother   ? Heart disease Mother   ? Obesity Mother   ? Hypertension Mother   ? Heart murmur Sister   ? Heart murmur Brother   ? Diabetes Sister   ? Thyroid disease Sister   ? Heart murmur Sister   ? ? ?Allergies:  ?Allergies  ?Allergen  Reactions  ? Lisinopril Swelling and Cough  ?  Face/throat swelling  ? Tree Extract Swelling and Other (See Comments)  ?  Swelling to eyes  ? Augmentin [Amoxicillin-Pot Clavulanate] Other (See Comments)  ?  Headache, dizzy  ? Ciprofloxacin Hives  ? ? ?Social History:  ? reports that she has quit smoking. Her smoking use included cigarettes. She has never used smokeless tobacco. She reports that she does not currently use alcohol. She reports that she does not use drugs.   ? ?Medications ?Medications Prior to Admission  ?Medication Sig Dispense Refill  ? A&D OINT Apply 1 application. topically daily as needed (rash).    ? albuterol (PROVENTIL) (2.5 MG/3ML) 0.083% nebulizer solution USE 1 VIAL VIA NEBULIZER EVERY 4 HOURS AS NEEDED FOR WHEEZING OR SHORTNESS OF BREATH (Patient taking differently: Take 2.5 mg by nebulization every 6 (six) hours as needed for shortness of breath.) 75 mL 2  ? amoxicillin (AMOXIL) 500 MG tablet Take 1 tablet (500 mg total) by mouth 2 (two) times daily. 14 tablet 0  ? Ascorbic Acid (VITAMIN C) 1000 MG tablet Take 1,000 mg by mouth every other day.     ? b complex vitamins tablet Take 1 tablet by mouth daily.    ? BIKTARVY 50-200-25 MG TABS tablet TAKE 1 TABLET BY MOUTH DAILY 30 tablet 5  ? BIOTIN PO Take 1 tablet by mouth every other day.    ? Calcium Citrate-Vitamin D (CALCIUM + D PO) Take 1 tablet by mouth every other day.    ? CALCIUM PO Take 1 tablet by mouth daily.    ? cyanocobalamin (,VITAMIN B-12,) 1000 MCG/ML injection ADMINISTER 1 ML(1000 MCG) IN THE MUSCLE EVERY 30 DAYS (Patient taking differently: Inject 1,000 mcg into the muscle every 30 (thirty) days.) 3 mL 1  ? dexamethasone (DECADRON) 4 MG tablet Take 1 tablet (4 mg total) by mouth 2 (two) times daily with a meal. 30 tablet 0  ? diltiazem (CARDIZEM CD) 240 MG 24 hr capsule TAKE 1 CAPSULE(240 MG) BY MOUTH DAILY (Patient taking differently: Take 240 mg by mouth daily.) 90 capsule 3  ? famotidine (PEPCID) 40 MG tablet Take 1  tablet (40 mg total) by mouth daily. 30 tablet 11  ? magnesium hydroxide (MILK OF MAGNESIA) 400 MG/5ML suspension Take 15 mLs by mouth daily as needed for mild constipation.    ? Melatonin 5 MG TABS Take 5 mg by mouth at bedtime as needed (for sleep).    ? Naphazoline HCl (CLEAR EYES OP) Place 1 drop into both eyes daily as needed (redness).    ? neomycin-bacitracin-polymyxin (NEOSPORIN) ointment Apply 1 application. topically daily as needed for wound care.    ? Olopatadine HCl (PATADAY OP) Place 1 drop into both eyes daily as needed (burning).    ? Oxycodone HCl 10 MG TABS Take 10 mg by mouth every 6 (six) hours as  needed (breakthrough pain).    ? OXYGEN Inhale 1.5-2 L into the lungs See admin instructions. Uses at bedtime and through the day as needed    ? Phenylephrine-Cocoa Butter (PREPARATION H RE) Place 1 application. rectally daily as needed (hemorrhoids).    ? Polyethyl Glycol-Propyl Glycol (SYSTANE OP) Place 1 drop into both eyes daily as needed (dry eyes).    ? potassium chloride (KLOR-CON) 10 MEQ tablet TAKE 4 TABLETS BY MOUTH DAILY FOR 5 DAYS ONLY (Patient taking differently: Take 40 mEq by mouth daily.) 30 tablet 0  ? spironolactone (ALDACTONE) 25 MG tablet Take 1 tablet (25 mg total) by mouth daily. 90 tablet 0  ? XARELTO 20 MG TABS tablet TAKE 1 TABLET(20 MG) BY MOUTH DAILY (Patient taking differently: 20 mg daily.) 90 tablet 0  ? furosemide (LASIX) 20 MG tablet TAKE 2 TABLETS BY MOUTH TWICE A DAY FOR 5 DAYS THEN AS DIRECTED BY PHYSICIAN (Patient not taking: Reported on 12/17/2021) 60 tablet 0  ? Misc. Devices (PULSE OXIMETER FOR FINGER) MISC 1 Units by Does not apply route as needed. 1 each 0  ? morphine (MS CONTIN) 15 MG 12 hr tablet Take 1 tablet by mouth every 12 hours. (Patient not taking: Reported on 12/17/2021) 30 tablet 0  ? Mouthwashes (BIOTENE DRY MOUTH GENTLE) LIQD Use as directed 1 Dose in the mouth or throat 2 (two) times daily as needed (dry mouth). (Patient not taking: Reported on  12/17/2021)    ? ondansetron (ZOFRAN) 4 MG tablet Take 1 tablet (4 mg total) by mouth every 8 (eight) hours as needed for nausea or vomiting. (Patient not taking: Reported on 11/27/2021) 20 tablet 0  ? SYRINGE/NEEDLE,

## 2022-01-05 NOTE — Progress Notes (Signed)
? ? ?  OVERNIGHT PROGRESS REPORT ? ?Notified by RN for fall prior to shift change. ? ?Staff heard sound and responded from hallway.  ?Patient was found sitting beside bed.  ? ?Patient told staff that she was reaching for an object and slid out of bed. ?Patient appears at her baseline for the recent time period assessments and is in no obvious or stated distress. ? ?No new injuries deformities, contusion, abrasions, tears, lacerations, or swelling noted.   ? ?MOE is as previously reported. ?Re-evaluations will be completed ongoing due to recent history. ?  ?Gershon Cull MSNA MSN ACNPC-AG ?Acute Care Nurse Practitioner ?[facility] ?[place] ? ? ? ?

## 2022-01-05 NOTE — Telephone Encounter (Signed)
CALLED PATIENT TO RESCHEDULE FU FROM 01-05-22, SPOKE WITH PATIENT AND SHE AGREED TO COME ON 04-06-22 @ 10:30 AM ?

## 2022-01-05 NOTE — Telephone Encounter (Signed)
Called sister to ask about rescheduling patients follow up appointment (01/05/22)with Dr. Sondra Come since she is currently inpatient. Per sister Ms. Flowers, patient will discharge from hospital and go into a skilled nursing facility. Sister is requesting follow up appointment be moved out a few months.  ?

## 2022-01-05 NOTE — Consult Note (Signed)
WOC Nurse Consult Note: ?Patient receiving care in [place]. ?Reason for Consult: Remove wound VAC dressing to Right AKA site. It is stuck to the sutures. ?Wound type: Surgical wound, patient of Dr. Sharol Given ?Pressure Injury POA: NA ?Measurement: na ?Wound bed: closed incision. Sutures and staples remained intact at conclusion of VAC Prevena dressing removal. Incision remains intact. ?Drainage (amount, consistency, odor) Small amount of dried blood along incision line ?Periwound: intact ?Dressing procedure/placement/frequency: ?Light cleansing of periwound to remove some of the dried blood performed with sterile saline. Two narrow foam dressings placed over the incision line to protect it. ? ?For any questions or concerns that may occur related to this wound, please contact Dr. Meridee Score, her surgeon. ?[place] nurse will not follow at this time.  Please re-consult the [place] team if needed. ? ?Val Riles, RN, MSN, CWOCN, CNS-BC, pager [phone]  ?

## 2022-01-05 NOTE — Care Management Important Message (Signed)
Important Message ? ?Patient Details IM Letter given to the Patient. ?Name: Shannon [name] ?MRN: [id number] ?Date of Birth: Jan 10, 1956 ? ? ?Medicare Important Message Given:  Yes ? ? ? ? ?Kerin Salen ?01/05/2022, 11:02 AM ?

## 2022-01-06 ENCOUNTER — Inpatient Hospital Stay (HOSPITAL_COMMUNITY): Payer: Medicare Other

## 2022-01-06 DIAGNOSIS — L02619 Cutaneous abscess of unspecified foot: Secondary | ICD-10-CM | POA: Diagnosis not present

## 2022-01-06 DIAGNOSIS — L02611 Cutaneous abscess of right foot: Secondary | ICD-10-CM | POA: Diagnosis not present

## 2022-01-06 DIAGNOSIS — J9602 Acute respiratory failure with hypercapnia: Secondary | ICD-10-CM | POA: Diagnosis not present

## 2022-01-06 DIAGNOSIS — A480 Gas gangrene: Secondary | ICD-10-CM | POA: Diagnosis not present

## 2022-01-06 LAB — GLUCOSE, CAPILLARY
Glucose-Capillary: 152 mg/dL — ABNORMAL HIGH (ref 70–99)
Glucose-Capillary: 161 mg/dL — ABNORMAL HIGH (ref 70–99)
Glucose-Capillary: 182 mg/dL — ABNORMAL HIGH (ref 70–99)
Glucose-Capillary: 176 mg/dL — ABNORMAL HIGH (ref 70–99)

## 2022-01-06 LAB — BASIC METABOLIC PANEL
Anion gap: 6 (ref 5–15)
BUN: 38 mg/dL — ABNORMAL HIGH (ref 8–23)
CO2: 39 mmol/L — ABNORMAL HIGH (ref 22–32)
Calcium: 10.4 mg/dL — ABNORMAL HIGH (ref 8.9–10.3)
Chloride: 93 mmol/L — ABNORMAL LOW (ref 98–111)
Creatinine, Ser: 0.98 mg/dL (ref 0.44–1.00)
GFR, Estimated: >60 mL/min (ref >60)
Glucose, Bld: 145 mg/dL — ABNORMAL HIGH (ref 70–99)
Potassium: 4.8 mmol/L (ref 3.5–5.1)
Sodium: 138 mmol/L (ref 135–145)

## 2022-01-06 LAB — CD4/CD8 (T-HELPER/T-SUPPRESSOR CELL)
CD4 absolute: 68 /uL — ABNORMAL LOW (ref 400–1790)
CD4%: 19.65 % — ABNORMAL LOW (ref 33–65)
CD8 T Cell Abs: 102 /uL — ABNORMAL LOW (ref 190–1000)
CD8tox: 29.33 % (ref 12–40)
Ratio: 0.67 — ABNORMAL LOW (ref 1.0–3.0)
Total lymphocyte count: 348 /uL — ABNORMAL LOW (ref 1000–4000)

## 2022-01-06 LAB — CBC
HCT: 28.9 % — ABNORMAL LOW (ref 36.0–46.0)
Hemoglobin: 9.1 g/dL — ABNORMAL LOW (ref 12.0–15.0)
MCH: 31.8 pg (ref 26.0–34.0)
MCHC: 31.5 g/dL (ref 30.0–36.0)
MCV: 101 fL — ABNORMAL HIGH (ref 80.0–100.0)
Platelets: 221 10*3/uL (ref 150–400)
RBC: 2.86 MIL/uL — ABNORMAL LOW (ref 3.87–5.11)
RDW: 15.6 % — ABNORMAL HIGH (ref 11.5–15.5)
WBC: 10.9 10*3/uL — ABNORMAL HIGH (ref 4.0–10.5)
nRBC: 0.6 % — ABNORMAL HIGH (ref 0.0–0.2)

## 2022-01-06 NOTE — Progress Notes (Signed)
Patient seems more confused this morning upon assessment. Patient was able to tell me that is was May of 2023 and her DOB, but states that she is at [facility]. Patient required much prompting to  put pills in her mouth and to follow that with sips of water. MD notified. ?

## 2022-01-06 NOTE — Progress Notes (Signed)
?[facility] Progress Note ? ?Patient: Shannon [name]     ?NWG:[id number]  ?DOA: 12/17/2021   ?PCP: [name], MD  ? ?  ?  ?Brief hospital course: ?Is a 66 year old female with metastatic lung adenocarcinoma, COPD, HIV, chronic diastolic heart failure, atrial flutter, diabetes mellitus and obesity who presented to the hospital with a right lower extremity abscess.  She was diagnosed with necrotizing fasciitis and underwent incision and drainage by orthopedic surgery. She was felt to have infection in her foreright arm as well and underwent a fasciotomy. Infection was ruled out. ? She was later discovered to have fungemia.  Infectious disease was consulted and has been assisting with antibiotic and antifungal choices.  Her hospital course was complicated by respiratory failure and worsening mental status and she was intubated and transferred to the ICU to be treated for COPD exacerbation. ? ?She was extubated on 5/1.   ?She subsequently underwent a right AKA on 12/26/2021 for nonhealing necrotizing fasciitis with gangrene. ?Her COPD exacerbation has resolved but she remains on O2 without which she becomes hypoxic. ?She has completed antibiotics and antifungals and is now awaiting placement. ?We were planning to discharge her to a SNF last week however, her confusion increased and we changed pain meds from Oxycodone (which she was taking at home) and Gabapentin to Morphine BID.  ? ?An infarct was identified on MRI and a contrasted MRI was then ordered. It was performed but pictures were poor and it was not a useful study.  ?Mental status initially cleared but is worse again today. Her wounds are bleeding (mild).  ?I have been in touch with neuro, ortho (for hand and leg) today. We have decided to hold Xarelto in light of ongoing oozing from wounds.  ? ? ?Subjective:  ?She has no specific complaints today. Per RN, she remains confused. I have examined her wounds and they are oozing blood.  ? ?Assessment and  Plan: ?Principal Problem: ?  Abscess of right foot, necrotizing fasciitis ?- Status post I&D on 12/20/2021 with OR culture revealing Klebsiella Proteus and Enterococcus ?- Status post right AKA on 5/5 ?- Completed Unasyn ?- acute onset confusion again on 5/12> due to confusion, have reduced dose of Oxycodone and Neurontin today.  ? - repeat UA is negative, WBC count normal, using the BiPAP at night ?- changed Oxycodone to Morphine and stopped Neurontin on 5/13 ?- mentation initially improved   ?- thankfully, pain is controlled on MS Contin and she is not using IV narcotics  ?- Wound vac removed- wound is clean from infection but is oozing blood- d/w Dr Sharol Given who recommends holding Xarelto ? ? ?Active Problems: ? ?Right forearm fascial biopsy and fasciotomy- right hand and arm edema ?- suspicion of infected arm-infection ruled out- biopsy revealed dense fibroadipose tissue ?- staples intact but noted to be oozing today-  notified Dr Caralyn Guile who recommends to continue dressings- iodoform gauze being used ?- I have been encouraging her daily to keep arm elevated to reduce edema. ? ? ?- ? ? ?New finding of subacute CVA ?-Due to confusion, MRI of the brain was obtained ?- Found to have a 1.6 cm evolving acute to early subacute ischemic infarct in the right anterior temporal lobe without any associated hemorrhage or mass effect ?- neurology consulted and would like to obtain an MRI with contrast- unfortunately this has a great deal of motion artifact ? ?  Fungemia ?-Suspected to be as a result of the wound infection ?- Diflucan to  be completed on 5/15 ?- ID has signed off ?- Ophthalmology eval completed on 5/9-no eye involvement noted ? ?  Acute respiratory failure with hypoxia and hypercapnia (HCC) ?COPD exacerbation ?Probable OSA ?- Failed BiPAP and required intubation ?- Exacerbation has resolved ?- she is using BiPAP during sleep in the hospital ?-Is being treated with albuterol nebs as needed, Brovana twice daily,  Pulmicort twice daily, prednisone taper, revefenacin neb daily ?- started incentive spirometry ?- still requiring oxygenand often removes her oxygen despite being advised not to ?- it does appear that O2 is listed on home meds to be used at bedtime and during the day as needed ?- will recheck a CXR for ongoing hypoxia ? ?Acute metabolic encephalopathy ?- much improved but not resolved- confusion waxed and wanes ?- RPR non reactive ?- Vit B12 normal ?- I do not feel she needs an ammonia level ?- MRI brain suggestive of CVA as above ?- ? Hospital acquired delirium vs related to HIV ?  ?  HIV disease ([place]) ?- Continue Biktarvy ?- CD4 count on 11/25/2021 was 143 ?- repeating CD 4 count ? ?DM 2 ?- sugars worsened by Decadron ?- A1c is 7.3 as of 12/17/21 ?- on Levemir and Novolog ? ?  Uncontrolled hypertension ?  (HFpEF) heart failure with preserved ejection fraction ([place]) ?-Currently receiving carvedilol 12.5 mg twice daily, diltiazem 360 mg, hydralazine 25 mg 3 times daily, spironolactone 25 mg daily  ?-Current medications include carvedilol, diltiazem, spironolactone and hydralazine IV ?  ? Morbid (severe) obesity due to excess calories ([place]) ?Body mass index is 37.88 kg/m?. ? ?  Atrial flutter ([place]) ?- Rate controlled on Cardizem and Coreg ?- on Xarelto ? ?Acute blood loss anemia with macrocytosis ?- Status post 1 unit of packed red blood cell transfusion on 5/7 and a second unit on 5/28 ?- Hemoglobin has been stable since ? ? Adenocarcinoma of lung ([place]), recurrent metastatic non-small cell lung cancer ?- Status post curative radiotherapy to left upper lobe lung nodules-8/21 ?- sadly, she has had a recurrence as of Feb 2023> metastatic disease involving several areas of the bones including the ribs as well as thoracic spine and lumbar spine as well as sacrum.  She also has numerous small solid bilateral pulmonary nodules. ?- underwent CT-guided core biopsy of the metastatic lesion of the posterior right ninth rib and  the final pathology was consistent with adenocarcinoma of the lung. ?- Follows with Dr. Earlie Server ?- she missed an appointment last week and I have requested for her to be evaluated in the hospital and receive another appointment ?- on Decadron per oncology (for bony mets)- on PPI for GI protection ? ?Diarrhea ?- resolved- rectal tube  removed ? ?Thrombocytopenia ?-  resolved ? ? ?PLAN> hold Xarelto today as her wounds are oozing blood. F/u on confusion and CD 4 count. ?  ?DVT prophylaxis:  SCD's Start: 12/27/21 0843 ?rivaroxaban (XARELTO) tablet 20 mg   ?  Code Status: Full Code  ?Consultants: ortho, ID, opthal, critical care ?Level of Care: Level of care: Telemetry ?Disposition Plan:  ?Status is: Inpatient ?Remains inpatient appropriate because: bleeding wounds ? ?NOTE: patient does not want [place] speaking with her sister,[name], without speaking with her (the patient) first ? ?Objective: ?  ?Vitals:  ? 01/06/22 0715 01/06/22 0745 01/06/22 0949 01/06/22 1308  ?BP:  (!) 155/82 (!) 149/78 (!) 166/83  ?Pulse:  71  63  ?Resp:      ?Temp:    98.5 ?F (36.9 ?C)  ?  TempSrc:    Oral  ?SpO2: 99%   99%  ?Weight:      ?Height:      ? ?Filed Weights  ? 01/02/22 0500 01/04/22 0555 01/05/22 0540  ?Weight: 103.6 kg 100.1 kg 100.1 kg  ? ?Exam: ?General exam: Appears comfortable  ?HEENT: PERRLA, oral mucosa moist, no sclera icterus or thrush ?Respiratory system: Clear to auscultation. Respiratory effort normal. ?Cardiovascular system: S1 & S2 heard, regular rate and rhythm ?Gastrointestinal system: Abdomen soft, non-tender, nondistended. Normal bowel sounds   ?Central nervous system: Alert and orientedx 2. No focal neurological deficits. ?Extremities: No cyanosis, clubbing - Rt AKA, right forearm oozing blood from incision- still quite swollen ?Skin: No rashes or ulcers ?Psychiatry:  Mood & affect appropriate.   ? ?Imaging and lab data was personally reviewed ? ? ? CBC: ?Recent Labs  ?Lab 01/03/22 ?3734  ?WBC 9.7  ?HGB 8.4*  ?HCT  27.5*  ?MCV 101.5*  ?PLT 214  ? ? ?Basic Metabolic Panel: ?Recent Labs  ?Lab 01/03/22 ?0450 01/04/22 ?0607 01/05/22 ?0500 01/06/22 ?2876  ?NA 138 142 136 138  ?K 4.7 4.5 4.5 4.8  ?CL 95* 97* 92* 93*  ?CO2 37* 38

## 2022-01-06 NOTE — Progress Notes (Signed)
Nutrition Follow-up ? ?DOCUMENTATION CODES:  ? ?Obesity unspecified ? ?INTERVENTION:  ? ?-Ensure Plus High Protein po BID, each supplement provides 350 kcal and 20 grams of protein.  ? ?-Prosource Plus PO BID, each provides 100 kcals and 15g protein ? ?-Juven BID, each serving provides 95kcal and 2.5g of protein (amino acids glutamine and arginine)  ? ? ?NUTRITION DIAGNOSIS:  ? ?Increased nutrient needs related to post-op healing, wound healing as evidenced by estimated needs. ? ?Ongoing. ? ?GOAL:  ? ?Patient will meet greater than or equal to 90% of their needs ? ?Progressing. ? ?MONITOR:  ? ?PO intake, Supplement acceptance, Labs, Weight trends, I & O's, Skin ? ?ASSESSMENT:  ? ?66 y.o. female with medical history significant of metastatic recurrent non-small cell lung cancer (01/2020) s/p curative radiotherapy (03/2020), with recurrence in February 2023, asthma, anxiety, COPD, GERD, hypertension, hyperlipidemia and HIV was brought in to the emergency department from [facility] due to worsening of the right lower extremity wound. Admitted for Abscess and necrotizing fasciitis of the right lower extremity and mild cellulitis of left lower extremity. ? ?4/26: admitted, s/p I&D of left foot and leg abscesses ?4/29: intubation; R forearm fascila biopsy and fasciotomy; R forearm I&D ?5/1: extubation ?5/5: s/p right AKA ? ?Patient currently consuming 10-100% of meals. Not drinking Ensure consistently but taking other supplements to aid in wound healing. ? ?Admission weight: 225 lbs. ?Current weight: 220 lbs ? ?Per nursing documentation, pt with moderate LLE edema and mild generalized edema. ? ?Medications: Vitamin C, Colace, Multivitamin with minerals daily, zinc ? ?Labs reviewed: ?CBGs: 138-161 ? ? ?Diet Order:   ?Diet Order   ? ?       ?  Diet Carb Modified Fluid consistency: Thin; Room service appropriate? Yes  Diet effective now       ?  ? ?  ?  ? ?  ? ? ?EDUCATION NEEDS:  ? ?Education needs have been  addressed ? ?Skin:  Skin Assessment: Skin Integrity Issues: ?Skin Integrity Issues:: Stage I, Other (Comment), Incisions ?Stage I: R buttocks ?Incisions: RLE for AKA (5/5) ?Other: MASD to bilateral buttocks ? ?Last BM:  5/14 ? ?Height:  ? ?Ht Readings from Last 1 Encounters:  ?12/20/21 5\' 4"  (1.626 m)  ? ? ?Weight:  ? ?Wt Readings from Last 1 Encounters:  ?01/05/22 100.1 kg  ? ? ?BMI:  Body mass index is 37.88 kg/m?. ? ?Estimated Nutritional Needs:  ? ?Kcal:  2400-2600 kcal ? ?Protein:  120-140 grams ? ?Fluid:  >/= 2.3 L/day ? ?Clayton Bibles, MS, RD, LDN ?Inpatient Clinical Dietitian ?Contact information available via Amion ? ?

## 2022-01-06 NOTE — Progress Notes (Signed)
DIAGNOSIS: Recurrent and metastatic non-small cell lung cancer initially diagnosed as stage IA (T1c, N0, M0) non-small cell lung cancer, adenocarcinoma presented with left upper lobe pulmonary nodule diagnosed in June 2021.  The patient has evidence for disease recurrence in February 2023. ?  ?PRIOR THERAPY:  ?1) Curative radiotherapy to left upper lobe lung nodules under the care of Dr. Sondra Come completed April 22, 2020. ?  ?CURRENT THERAPY: None ? ?Subjective: ?The patient is seen and examined today.  She is feeling fine with no concerning complaints except for the weakness in the lower extremities after the AKA of the right lower extremity.  She denied having any chest pain, shortness of breath, cough or hemoptysis.  She has no nausea or vomiting.  She is currently in the process of finding skilled nursing facility for rehabilitation. ? ?Objective: ?Vital signs in last 24 hours: ?Temp:  [97.8 ?F (36.6 ?C)-98.5 ?F (36.9 ?C)] 97.8 ?F (36.6 ?C) (05/16 0509) ?Pulse Rate:  [62-71] 71 (05/16 0745) ?Resp:  [18] 18 (05/16 0509) ?BP: (142-166)/(60-82) 155/82 (05/16 0745) ?SpO2:  [96 %-100 %] 99 % (05/16 0715) ? ?Intake/Output from previous day: ?05/15 0701 - 05/16 0700 ?In: 480 [P.O.:480] ?Out: 1800 [Urine:1800] ?Intake/Output this shift: ?No intake/output data recorded. ? ?General appearance: alert, cooperative, fatigued, and no distress ?Resp: clear to auscultation bilaterally ?Cardio: regular rate and rhythm, S1, S2 normal, no murmur, click, rub or gallop ?GI: soft, non-tender; bowel sounds normal; no masses,  no organomegaly ?Extremities: Right lower extremity above-knee amputation ? ?Lab Results:  ?No results for input(s): WBC, HGB, HCT, PLT in the last 72 hours. ?BMET ?Recent Labs  ?  01/04/22 ?0607 01/05/22 ?0500  ?NA 142 136  ?K 4.5 4.5  ?CL 97* 92*  ?CO2 38* 38*  ?GLUCOSE 202* 174*  ?BUN 39* 44*  ?CREATININE 1.07* 1.06*  ?CALCIUM 10.7* 10.3  ? ? ?Studies/Results: ?MR BRAIN W CONTRAST ? ?Result Date:  01/05/2022 ?CLINICAL DATA:  Stroke follow-up. History of metastatic lung cancer and HIV. EXAM: MRI HEAD WITH CONTRAST TECHNIQUE: Multiplanar, multiecho pulse sequences of the brain and surrounding structures were obtained with intravenous contrast. CONTRAST:  38m GADAVIST GADOBUTROL 1 MMOL/ML IV SOLN COMPARISON:  Noncontrast head MRI 01/03/2022 FINDINGS: The study is moderately to severely motion degraded despite attempts at repeat imaging. No definite enhancement is identified in the anterior right temporal lobe at the site of restricted diffusion on the recent noncontrast MRI, and no definite abnormal intracranial enhancement is identified elsewhere although the degree of motion significantly reduces sensitivity for detection of small enhancing lesions. Prior right parietal craniotomy and a known right central skull base/clival metastasis are again noted. IMPRESSION: Motion degraded examination without definite abnormal intracranial enhancement. Electronically Signed   By: ALogan BoresM.D.   On: 01/05/2022 14:37   ? ?Medications: I have reviewed the patient's current medications. ? ? ?Assessment/Plan: ?This is a 66years old African-American female with metastatic non-small cell lung cancer that was initially diagnosed as stage Ia (T1c, N0, M0) non-small cell lung cancer, adenocarcinoma when she presented with left upper lobe pulmonary nodule diagnosed in June 2021 status post curative radiotherapy to the left upper lobe lung nodule under the care of Dr. KSondra Comein August 2021 and has been in observation and monitoring by him.  She missed all her follow-up visit with me after that time.Unfortunately in February 2023 she had evidence for disease recurrence with multiple metastatic bone lesions. ?The patient had molecular studies by [employer] that showed positive EGFR mutation but in  exon 20.  There is no first-line treatment with targeted therapy for exon 20 EGFR mutation and the currently approved regimens are  to be used in the second line setting after first-line treatment with systemic chemotherapy plus minus immunotherapy. ?I discussed the results and the treatment options with the patient today.  The patient is so weak to start any systemic therapy right now and she also wants more time to recover before considering any treatment. ?I will arrange for the patient a follow-up appointment with me in 2-3 weeks after discharge to meet with her and the family regarding her treatment options and whether she would be interested in systemic chemotherapy or consider palliative care and hospice. ?It is always to get the palliative care team involved in her care to discuss goals of care. ?Thank you for taking good care of Ms. Fundora, we will continue to follow-up the patient with you and assist in her management on as-needed basis. ?Disclaimer: This note was dictated with voice recognition software. Similar sounding words can inadvertently be transcribed and may be missed upon review. ?  ? LOS: 20 days  ? ? ?Eilleen Kempf ?01/06/2022 ? ?  ?

## 2022-01-07 ENCOUNTER — Inpatient Hospital Stay: Payer: Medicare Other | Admitting: Internal Medicine

## 2022-01-07 DIAGNOSIS — J9602 Acute respiratory failure with hypercapnia: Secondary | ICD-10-CM | POA: Diagnosis not present

## 2022-01-07 DIAGNOSIS — L02619 Cutaneous abscess of unspecified foot: Secondary | ICD-10-CM | POA: Diagnosis not present

## 2022-01-07 DIAGNOSIS — L02611 Cutaneous abscess of right foot: Secondary | ICD-10-CM | POA: Diagnosis not present

## 2022-01-07 LAB — BASIC METABOLIC PANEL
Anion gap: 6 (ref 5–15)
BUN: 39 mg/dL — ABNORMAL HIGH (ref 8–23)
CO2: 38 mmol/L — ABNORMAL HIGH (ref 22–32)
Calcium: 10.3 mg/dL (ref 8.9–10.3)
Chloride: 92 mmol/L — ABNORMAL LOW (ref 98–111)
Creatinine, Ser: 0.89 mg/dL (ref 0.44–1.00)
GFR, Estimated: >60 mL/min (ref >60)
Glucose, Bld: 159 mg/dL — ABNORMAL HIGH (ref 70–99)
Potassium: 4.6 mmol/L (ref 3.5–5.1)
Sodium: 136 mmol/L (ref 135–145)

## 2022-01-07 LAB — CBC
HCT: 29 % — ABNORMAL LOW (ref 36.0–46.0)
Hemoglobin: 9.1 g/dL — ABNORMAL LOW (ref 12.0–15.0)
MCH: 31.5 pg (ref 26.0–34.0)
MCHC: 31.4 g/dL (ref 30.0–36.0)
MCV: 100.3 fL — ABNORMAL HIGH (ref 80.0–100.0)
Platelets: 197 10*3/uL (ref 150–400)
RBC: 2.89 MIL/uL — ABNORMAL LOW (ref 3.87–5.11)
RDW: 15.7 % — ABNORMAL HIGH (ref 11.5–15.5)
WBC: 9.2 10*3/uL (ref 4.0–10.5)
nRBC: 0.4 % — ABNORMAL HIGH (ref 0.0–0.2)

## 2022-01-07 LAB — GLUCOSE, CAPILLARY
Glucose-Capillary: 154 mg/dL — ABNORMAL HIGH (ref 70–99)
Glucose-Capillary: 162 mg/dL — ABNORMAL HIGH (ref 70–99)
Glucose-Capillary: 217 mg/dL — ABNORMAL HIGH (ref 70–99)
Glucose-Capillary: 231 mg/dL — ABNORMAL HIGH (ref 70–99)

## 2022-01-07 NOTE — Progress Notes (Signed)
?Progress Note ? ? ?Patient: Shannon [name] MWN:[id number] DOB: July 20, 1956 DOA: 12/17/2021     21 ?DOS: the patient was seen and examined on 01/07/2022 ?  ?Brief hospital course: ?66 year old female with a history of metastatic adenocarcinoma of lung, COPD, HIV, was admitted to the hospital with right lower extremity wound, necrotizing fascitis,seen by orthopedics and underwent incision and drainage.  He was noted to have fungemia.  Infectious disease following for antibiotics/antifungals. Overall, her condition worsened with worsening mental status and respiratory status.She required intubation and transfer to ICU.  She was treated for COPD exacerbation and since then, respiratory status has stabilized and she was extubated on 5/1.  Orthopedics following for gas gangrene/necrotizing fasciitis of her right lower extremity and plans are for amputation, underwent Rt AKA at [facility] 12/26/21. Post op with anemia needing prbc transfusions x2. PTOT working and advised CIR ? ? ?Assessment and Plan: ?No notes have been filed under this hospital service. ?Service: Hospitalist ?Principal Problem: ?  Abscess of right foot, necrotizing fasciitis ?- Status post I&D on 12/20/2021 with OR culture revealing Klebsiella Proteus and Enterococcus ?- Status post right AKA on 5/5 ?- Completed Unasyn ?- repeat UA is negative, WBC count normal, using the BiPAP at night ?- changed Oxycodone to Morphine and stopped Neurontin on 5/13 secondary to confusion ?- Wound vac recently removed- Recently noted to be oozing blood-  Dr. Wynelle Cleveland d/w Dr Sharol Given at that time who recommends holding Xarelto ?  ?Active Problems:  ?Right forearm fascial biopsy and fasciotomy- right hand and arm edema ?- suspicion of infected arm-infection ruled out- biopsy revealed dense fibroadipose tissue ?- Dr. Wynelle Cleveland d/w Dr Caralyn Guile who recommends to continue dressings- iodoform gauze being used ?  ?New finding of subacute CVA ?-Due to confusion, MRI of the brain was obtained ?- Found to  have a 1.6 cm evolving acute to early subacute ischemic infarct in the right anterior temporal lobe without any associated hemorrhage or mass effect ?- neurology consulted, recommended MRI with contrast- unfortunately this has a great deal of motion artifact ?-Neurology has since recommended repeat MRI brain in 4 weeks w/ contrast to assess change  ?-Neuro recs to continue Biktarvy and monitor CD4 and avoid deliriogenic meds ? ?  Fungemia ?-Suspected to be as a result of the wound infection ?- Diflucan to be completed on 5/15 ?- ID has signed off ?- Ophthalmology eval completed on 5/9-no eye involvement noted ? ?  Acute respiratory failure with hypoxia and hypercapnia (HCC) ?COPD exacerbation ?Probable OSA ?- Failed BiPAP and required intubation ?- Exacerbation has resolved ?- she is using BiPAP during sleep in the hospital ?-Is being treated with albuterol nebs as needed, Brovana twice daily, Pulmicort twice daily, prednisone taper, revefenacin neb daily ?- Continue incentive spirometry ?- Recent CXR reviewed, findings of unchanged L perihilar opacities with fiduciary markers, PICC in place ?  ?Acute metabolic encephalopathy ?- RPR non reactive ?- Vit B12 normal ?- MRI brain suggestive of CVA as above ?- Conversing appropriately this AM ?  ?  HIV disease ([place]) ?- Continue Biktarvy ?- CD4 count on 11/25/2021 was 143 ?- repeat CD 4 count of 68 ?  ?DM 2 ?- sugars worsened by Decadron ?- A1c is 7.3 as of 12/17/21 ?- on Levemir and Novolog ?  ?  Uncontrolled hypertension ?  (HFpEF) heart failure with preserved ejection fraction ([place]) ?-Currently receiving carvedilol 12.5 mg twice daily, diltiazem 360 mg, hydralazine 25 mg 3 times daily, spironolactone 25 mg daily  ?-Current medications include  carvedilol, diltiazem, spironolactone and hydralazine IV ?  ? Morbid (severe) obesity due to excess calories ([place]) ?Body mass index is 37.88 kg/m?. ?  ?  Atrial flutter ([place]) ?- Rate controlled on Cardizem and Coreg ?- on Xarelto ?   ?Acute blood loss anemia with macrocytosis ?- Status post 1 unit of packed red blood cell transfusion on 5/7 and a second unit on 5/28 ?- Hemoglobin has been stable since ?  ? Adenocarcinoma of lung ([place]), recurrent metastatic non-small cell lung cancer ?- Status post curative radiotherapy to left upper lobe lung nodules-8/21 ?- sadly, she has had a recurrence as of Feb 2023> metastatic disease involving several areas of the bones including the ribs as well as thoracic spine and lumbar spine as well as sacrum.  She also has numerous small solid bilateral pulmonary nodules. ?- underwent CT-guided core biopsy of the metastatic lesion of the posterior right ninth rib and the final pathology was consistent with adenocarcinoma of the lung. ?- Follows with Dr. Earlie Server ?- she missed an appointment last week and I have requested for her to be evaluated in the hospital and receive another appointment ?- on Decadron per oncology (for bony mets)- on PPI for GI protection ?  ?Diarrhea ?- resolved- rectal tube  removed ?  ?Thrombocytopenia ?-  resolved ?  ?  ?PLAN> hold Xarelto given recent oozing of wound ?  ? ? ?  ? ?Subjective: Reports feeling better today ? ?Physical Exam: ?Vitals:  ? 01/07/22 0506 01/07/22 0736 01/07/22 0836 01/07/22 1545  ?BP: (!) 154/83  (!) 156/82 128/69  ?Pulse: 66   (!) 59  ?Resp: 18   18  ?Temp: 98 ?F (36.7 ?C)   97.8 ?F (36.6 ?C)  ?TempSrc: Oral   Oral  ?SpO2: 100% 99%  100%  ?Weight:      ?Height:      ? ?General exam: Awake, laying in bed, in nad ?Respiratory system: Normal respiratory effort, no wheezing ?Cardiovascular system: regular rate, s1, s2 ?Gastrointestinal system: Soft, nondistended, positive BS ?Central nervous system: CN2-12 grossly intact, strength intact ?Extremities: Perfused, no clubbing ?Skin: Normal skin turgor, no notable skin lesions seen ?Psychiatry: Mood normal // no visual hallucinations  ? ?Data Reviewed: ? ?Labs reviewed: Cr 0.89, Hgb 9.1  ? ?Family Communication: Pt in  room, family not at bedside ? ?Disposition: ?Status is: Inpatient ?Remains inpatient appropriate because: Severity of illness ? Planned Discharge Destination: Skilled nursing facility ? ? ? ?Author: ?Marylu Lund, MD ?01/07/2022 4:48 PM ? ?For on call review [email or web address].  ?

## 2022-01-07 NOTE — Plan of Care (Signed)
MRI brain with contrast add-on study did not show any enhancement of the anterior right temporal lobe faint and ill-defined/hazy DWI+ lesion seen on the initial MRI brain obtained this admission. The lesion is in the anterior temporal lobe, affecting only the white matter, both of which are features which would be atypical for an ischemic infarction. There is no mass effect, or enhancement, which would make focal cerebritis as well as a metastatic lesion unlikely. There is associated FLAIR signal abnormality suggestive of chronicity, but the dark signal on the ADC map would be more in favor of acute or recent/resolving cytotoxic edema. Given her HIV with low CD4 count, progressive multifocal leukoencephalopathy would be a differential diagnostic consideration.  ? ?A/R: 66 year old female with metastatic lung adenocarcinoma s/p curative radiotherapy 03/2020 but with recurrence 09/2021, COPD, HIV, CKD Stage IIIa, chronic diastolic heart failure, atrial fibrillation/flutter, diabetes mellitus and obesity who presented to the hospital with a right lower extremity abscess 12/17/2021. Neurology was consulted for waxing/waning confusion.  ?- The patient will need repeat MRI brain in 4 weeks with contrast to assess for possible interval change.  ?- Continue Biktarvy and monitor CD4 count.  ?- Avoid deliriogenic medications.  ?- Apixaban has been held from a risk/benefit standpoint given oozing of blood from her healing wound. Agree with this as an unavoidable short term measure to allow for wound healing, but would restart as soon as possible.  ? ?Electronically signed: Dr. Kerney Elbe ? ?

## 2022-01-07 NOTE — Progress Notes (Addendum)
Physical Therapy Treatment Patient Details Name: Shannon [name] MRN: [id number] DOB: 1956/08/01 Today's Date: 01/07/2022   History of Present Illness Damian Baldassarre is a 66 y.o. woman with metastatic non-small cell lung cancer, asthma/COPD, GERD, HTN, and HIV who was admitted 12/17/21  due to right lower extremity wound  S/P irrigation and debridement of right foot and calf wounds on 12/17/21 .  On 4/28 she developed swelling of her right upper extremity and went for incision and drainage morning of 4/29 without overt signs of necrotizing fasciitis. She developed progressive mental status changes 4/28 to 4/29 with obtundation. She was transferred to the ICU and PCCM consulted for hypercapnic respiratory failure.  Intubated 4/29 and extubated 12/22/21. Pt underwent Rt AKA on 12/26/21.    PT Comments    Co-treat with OT for safety with focus on functional mobility and ADLs. Pt has improved with bed mobility since previous session and was able to roll side to side to complete pericare with min assist and was able to complete ADLs with assist from OT, as well as scooting up in bed in supine positioning. Pt able to complete other bed mobility tasks with min assist. Completed Anterior-Posterior transfer from bed to chair with mod assist +2 using bed pad, pt required many rest breaks repeatedly saying "I'm tired and out of breath," max cuing for pursed lip breathing, SpO2 monitored via pulse ox, ranging from 87-96%. Pt completed seated LE HEP in chair after transfer, demonstrated good form, encouraged pt to complete outside of therapy session. Cognitively, pt is perseverating on the word "yes" and required cuing to name OT and PT staff members despite working with them previously and unable to name the day or that she is in the hospital. Current discharge destination remains appropriate, we will continue to follow her acutely.           Recommendations for follow up therapy are one component of a multi-disciplinary  discharge planning process, led by the attending physician.  Recommendations may be updated based on patient status, additional functional criteria and insurance authorization.  Follow Up Recommendations  Skilled nursing-short term rehab (<3 hours/day)     Assistance Recommended at Discharge Frequent or constant Supervision/Assistance  Patient can return home with the following Two people to help with walking and/or transfers;Assistance with cooking/housework;Assist for transportation;Help with stairs or ramp for entrance;Two people to help with bathing/dressing/bathroom;Direct supervision/assist for medications management   Equipment Recommendations  Wheelchair cushion (measurements PT);Wheelchair (measurements PT)    Recommendations for Other Services       Precautions / Restrictions Precautions Precautions: Fall Precaution Comments: R AKA, moves quick - be close Restrictions Weight Bearing Restrictions: Yes RLE Weight Bearing: Non weight bearing Other Position/Activity Restrictions: New R AKA     Mobility  Bed Mobility Overal bed mobility: Needs Assistance Bed Mobility: Rolling, Supine to Sit Rolling: Supervision Sidelying to sit: HOB elevated, Min assist Supine to sit: Min assist, HOB elevated Sit to supine: HOB elevated, Min assist   General bed mobility comments: Pt able to roll side to side for pericare with supervision, no physical assist required. Pt also able to scoot up in bed with min assist, all other bed mobility min assist. Pt generally impulsive and sat up and scooted EOB without request, but demonstrates better movement overall.    Transfers Overall transfer level: Needs assistance Equipment used: None Transfers: Bed to chair/wheelchair/BSC         Anterior-Posterior transfers: +2 physical assistance, +2 safety/equipment, Max assist  General transfer comment: Pt able to complete AP transfer with mod assist +2 using bed pads to scoot hips across gap  into recliner.    Ambulation/Gait               General Gait Details: currently non amb NEW AKA   Stairs             Wheelchair Mobility    Modified Rankin (Stroke Patients Only)       Balance Overall balance assessment: Needs assistance Sitting-balance support: Bilateral upper extremity supported, Feet supported Sitting balance-Leahy Scale: Fair Sitting balance - Comments: min guard required - like to scoot too close to the edge of the bed       Standing balance comment: deferred, unsafe at present                            Cognition Arousal/Alertness: Awake/alert Behavior During Therapy: WFL for tasks assessed/performed Overall Cognitive Status: Impaired/Different from baseline Area of Impairment: Memory, Safety/judgement, Following commands, Awareness, Problem solving, Attention                 Orientation Level: Disoriented to, Place, Time Current Attention Level: Sustained Memory: Decreased short-term memory, Decreased recall of precautions Following Commands: Follows one step commands with increased time Safety/Judgement: Decreased awareness of safety, Decreased awareness of deficits Awareness: Emergent Problem Solving: Requires verbal cues, Requires tactile cues, Difficulty sequencing, Decreased initiation, Slow processing General Comments: Able to follow commands with increased time or reinstruction. Perseverates on saying "yes" throughout treatment. Impulsive. Needed reintroduction to therapist and need for therapy. Went over how and why to use call button. required written note "to call for help." When asked to demonstrate the use of the call button she didn't know how but kept tapping the smiley face on the hand written note. She demonstrated once effectively before therapist left the room.        Exercises Other Exercises Other Exercises: Ankle pumps b/l x10 Other Exercises: Hip ABD/adduction b/l x10 Other Exercises: heel  slides x10 LLE    General Comments        Pertinent Vitals/Pain Pain Assessment Pain Assessment: Faces Faces Pain Scale: Hurts little more Pain Location: periarea Pain Descriptors / Indicators: Grimacing, Burning, Sore Pain Intervention(s): Monitored during session, Repositioned    Home Living                          Prior Function            PT Goals (current goals can now be found in the care plan section) Acute Rehab PT Goals Patient Stated Goal: get better PT Goal Formulation: With patient Time For Goal Achievement: 01/13/22 Potential to Achieve Goals: Fair Progress towards PT goals: Progressing toward goals    Frequency    Min 2X/week      PT Plan Current plan remains appropriate    Co-evaluation PT/OT/SLP Co-Evaluation/Treatment: Yes Reason for Co-Treatment: Necessary to address cognition/behavior during functional activity PT goals addressed during session: Mobility/safety with mobility OT goals addressed during session: ADL's and self-care      AM-PAC PT "6 Clicks" Mobility   Outcome Measure  Help needed turning from your back to your side while in a flat bed without using bedrails?: A Little Help needed moving from lying on your back to sitting on the side of a flat bed without using bedrails?: A Little Help needed moving to and  from a bed to a chair (including a wheelchair)?: A Lot Help needed standing up from a chair using your arms (e.g., wheelchair or bedside chair)?: A Lot Help needed to walk in hospital room?: A Lot Help needed climbing 3-5 steps with a railing? : Total 6 Click Score: 13    End of Session Equipment Utilized During Treatment: Gait belt;Oxygen Activity Tolerance: Patient tolerated treatment well Patient left: in chair;with call bell/phone within reach;with chair alarm set Nurse Communication: Mobility status;Need for lift equipment PT Visit Diagnosis: Unsteadiness on feet (R26.81);Pain Pain - Right/Left:  Right Pain - part of body: Leg     Time: 7829-5621 PT Time Calculation (min) (ACUTE ONLY): 53 min  Charges:  $Therapeutic Activity: 38-52 mins                     Jamesetta Geralds, PT, DPT [facility] Department Office: [phone] Pager: [phone]   Jamesetta Geralds 01/07/2022, 3:03 PM

## 2022-01-07 NOTE — Progress Notes (Signed)
Occupational Therapy Treatment ?Patient Details ?Name: Shannon [name] ?MRN: [id number] ?DOB: 12-May-1956 ?Today's Date: 01/07/2022 ? ? ?History of present illness Shannon [name] is a 66 y.o. woman with metastatic non-small cell lung cancer, asthma/COPD, GERD, HTN, and HIV who was admitted 12/17/21  due to right lower extremity wound  S/P irrigation and debridement of right foot and calf wounds on 12/17/21 .  On 4/28 she developed swelling of her right upper extremity and went for incision and drainage morning of 4/29 without overt signs of necrotizing fasciitis. She developed progressive mental status changes 4/28 to 4/29 with obtundation. She was transferred to the ICU and PCCM consulted for hypercapnic respiratory failure.  Intubated 4/29 and extubated 12/22/21. Pt underwent Rt AKA on 12/26/21. ?  ?OT comments ? Co-treat with PT for safety focusing on functional mobilities and ADLs. Patient continues to exhibit impaired cognition - needing reintroduction to therapist and need for therapy. She perseverates on the word "yes" today, needs increased time and breaks during scooting, and instruction on how to use the call bell. Patient demonstrating improved ability to participate in bed level ADLs - donning/doffing mesh underwear, bed mobility and scooting. Continue to recommend short term rehab to improve patient's functional abilities and reduce caregiver burden.  ? ?Recommendations for follow up therapy are one component of a multi-disciplinary discharge planning process, led by the attending physician.  Recommendations may be updated based on patient status, additional functional criteria and insurance authorization. ?   ?Follow Up Recommendations ? Skilled nursing-short term rehab (<3 hours/day)  ?  ?Assistance Recommended at Discharge Frequent or constant Supervision/Assistance  ?Patient can return home with the following ? Assistance with cooking/housework;Direct supervision/assist for medications management;Direct  supervision/assist for financial management;Assist for transportation;Help with stairs or ramp for entrance;Two people to help with walking and/or transfers;A lot of help with bathing/dressing/bathroom ?  ?Equipment Recommendations ? Other (comment) (Defer to next venue)  ?  ?Recommendations for Other Services   ? ?  ?Precautions / Restrictions Precautions ?Precautions: Fall ?Precaution Comments: R AKA, moves quick - be close ?Restrictions ?RLE Weight Bearing: Non weight bearing ?Other Position/Activity Restrictions: New R AKA  ? ? ?  ? ?   ?Balance Overall balance assessment: Needs assistance ?Sitting-balance support: Bilateral upper extremity supported, Feet supported ?Sitting balance-Leahy Scale: Fair ?Sitting balance - Comments: min guard required - like to scoot too close to the edge of the bed ?  ?  ?  ?Standing balance comment: deferred, unsafe at present ?  ?  ?  ?  ?  ?  ?  ?  ?  ?  ?  ?   ? ?ADL either performed or assessed with clinical judgement  ? ?ADL Overall ADL's : Needs assistance/impaired ?Eating/Feeding: Independent;Sitting ?Eating/Feeding Details (indicate cue type and reason): to eat cookie ?  ?  ?  ?  ?  ?  ?  ?  ?Lower Body Dressing: Moderate assistance;Bed level ?Lower Body Dressing Details (indicate cue type and reason): worked on [employer] and doffing mesh underwear at bed level when found to be soiled. Able to doff with just min assist but needed mod assist to don. ?  ?  ?Toileting- Clothing Manipulation and Hygiene: Total assistance;Bed level ?Toileting - Clothing Manipulation Details (indicate cue type and reason): total assist for pericare after being found soiled - but performs rolling without physical assist just verbal instruction ?  ?  ?  ?General ADL Comments: Can perform rolilng in bed with supervision to assist with pericare. But improving  ability to assist wtih ADLs at bed level. ?  ? ?Extremity/Trunk Assessment Upper Extremity Assessment ?Upper Extremity Assessment: Overall WFL  for tasks assessed ?RUE Deficits / Details: Right forearm still dressed. Reports some pain but able to use effectively ?  ?  ?  ?  ?  ? ?Vision   ?Vision Assessment?: No apparent visual deficits ?  ?Perception   ?  ?Praxis   ?  ? ?Cognition Arousal/Alertness: Awake/alert ?Behavior During Therapy: [facility] for tasks assessed/performed ?Overall Cognitive Status: Impaired/Different from baseline ?Area of Impairment: Memory, Safety/judgement, Following commands, Awareness, Problem solving, Attention ?  ?  ?  ?  ?  ?  ?  ?  ?  ?  ?Memory: Decreased short-term memory, Decreased recall of precautions ?Following Commands: Follows one step commands with increased time ?Safety/Judgement: Decreased awareness of safety, Decreased awareness of deficits ?Awareness: Emergent ?Problem Solving: Requires verbal cues, Requires tactile cues, Difficulty sequencing, Decreased initiation, Slow processing ?General Comments: Able to follow commands with increased time or reinstruction. Perseverates on saying "yes" throughout treatment. Impulsive. Needed reintroduction to therapist and need for therapy. Went over how and why to use call button. required written note "to call for help." When asked to demonstrate the use of the call button she didn't know how but kept tapping the smiley face on the hand written note. She demonstrated once effectively before therapist left the room. ?  ?  ?   ?   ?   ?   ? ? ?Pertinent Vitals/ Pain       Pain Assessment ?Pain Assessment: Faces ?Faces Pain Scale: Hurts little more ?Pain Location: periarea ?Pain Descriptors / Indicators: Grimacing, Burning, Sore ?Pain Intervention(s): Monitored during session ? ? ?Frequency ? Min 2X/week  ? ? ? ? ?  ?Progress Toward Goals ? ?OT Goals(current goals can now be found in the care plan section) ? Progress towards OT goals: Progressing toward goals ? ?Acute Rehab OT Goals ?OT Goal Formulation: Patient unable to participate in goal setting ?Time For Goal Achievement:  01/12/22 ?Potential to Achieve Goals: Good  ?Plan Discharge plan remains appropriate   ? ?Co-evaluation ? ? ?   ?Reason for Co-Treatment: Necessary to address cognition/behavior during functional activity;For patient/therapist safety;To address functional/ADL transfers ?PT goals addressed during session: Mobility/safety with mobility ?OT goals addressed during session: ADL's and self-care ?  ? ?  ?AM-PAC OT "6 Clicks" Daily Activity     ?Outcome Measure ? ? Help from another person eating meals?: A Little ?Help from another person taking care of personal grooming?: A Little ?Help from another person toileting, which includes using toliet, bedpan, or urinal?: Total ?Help from another person bathing (including washing, rinsing, drying)?: A Lot ?Help from another person to put on and taking off regular upper body clothing?: A Little ?Help from another person to put on and taking off regular lower body clothing?: A Lot ?6 Click Score: 14 ? ?  ?End of Session Equipment Utilized During Treatment: Oxygen ? ?OT Visit Diagnosis: Other abnormalities of gait and mobility (R26.89) ?  ?Activity Tolerance Patient tolerated treatment well ?  ?Patient Left in chair;with call bell/phone within reach;with chair alarm set ?  ?Nurse Communication Mobility status ?  ? ?   ? ?Time: 4010-2725 ?OT Time Calculation (min): 52 min ? ?Charges: OT General Charges ?$OT Visit: 1 Visit ?OT Treatments ?$Self Care/Home Management : 8-22 mins ? ?Gredmarie Delange, OTR/L ?Acute Care Rehab Services  ?Office [phone] ?Pager: [phone]  ? ?Jorgen Wolfinger L Arael Piccione ?01/07/2022, 2:03 PM ?

## 2022-01-08 DIAGNOSIS — L02619 Cutaneous abscess of unspecified foot: Secondary | ICD-10-CM | POA: Diagnosis not present

## 2022-01-08 DIAGNOSIS — L02611 Cutaneous abscess of right foot: Secondary | ICD-10-CM | POA: Diagnosis not present

## 2022-01-08 LAB — COMPREHENSIVE METABOLIC PANEL
ALT: 29 U/L (ref 0–44)
AST: 16 U/L (ref 15–41)
Albumin: 2.5 g/dL — ABNORMAL LOW (ref 3.5–5.0)
Alkaline Phosphatase: 76 U/L (ref 38–126)
Anion gap: 7 (ref 5–15)
BUN: 39 mg/dL — ABNORMAL HIGH (ref 8–23)
CO2: 40 mmol/L — ABNORMAL HIGH (ref 22–32)
Calcium: 10.1 mg/dL (ref 8.9–10.3)
Chloride: 91 mmol/L — ABNORMAL LOW (ref 98–111)
Creatinine, Ser: 0.96 mg/dL (ref 0.44–1.00)
GFR, Estimated: >60 mL/min (ref >60)
Glucose, Bld: 164 mg/dL — ABNORMAL HIGH (ref 70–99)
Potassium: 4.6 mmol/L (ref 3.5–5.1)
Sodium: 138 mmol/L (ref 135–145)
Total Bilirubin: 0.6 mg/dL (ref 0.3–1.2)
Total Protein: 5.3 g/dL — ABNORMAL LOW (ref 6.5–8.1)

## 2022-01-08 LAB — APTT
aPTT: 23 seconds — ABNORMAL LOW (ref 24–36)
aPTT: 45 seconds — ABNORMAL HIGH (ref 24–36)

## 2022-01-08 LAB — GLUCOSE, CAPILLARY
Glucose-Capillary: 145 mg/dL — ABNORMAL HIGH (ref 70–99)
Glucose-Capillary: 174 mg/dL — ABNORMAL HIGH (ref 70–99)
Glucose-Capillary: 187 mg/dL — ABNORMAL HIGH (ref 70–99)
Glucose-Capillary: 206 mg/dL — ABNORMAL HIGH (ref 70–99)

## 2022-01-08 LAB — HEPARIN LEVEL (UNFRACTIONATED): Heparin Unfractionated: 0.41 IU/mL (ref 0.30–0.70)

## 2022-01-08 LAB — PROTIME-INR
INR: 1.2 (ref 0.8–1.2)
Prothrombin Time: 14.9 seconds (ref 11.4–15.2)

## 2022-01-08 MED ORDER — HEPARIN (PORCINE) 25000 UT/250ML-% IV SOLN
1200.0000 [IU]/h | INTRAVENOUS | Status: AC
  Filled 2022-01-08: qty 250

## 2022-01-08 MED ORDER — HEPARIN (PORCINE) 25000 UT/250ML-% IV SOLN
1000.0000 [IU]/h | INTRAVENOUS | Status: DC
  Administered 2022-01-08: 1000 [IU]/h via INTRAVENOUS
  Filled 2022-01-08: qty 250

## 2022-01-08 NOTE — Progress Notes (Addendum)
ANTICOAGULATION CONSULT NOTE - Initial Consult  Pharmacy Consult for IV Heparin Indication: Stroke, anticoagulation per Neurology.  History of atrial flutter and Xarelto on hold.  Allergies  Allergen Reactions   Lisinopril Swelling and Cough    Face/throat swelling   Tree Extract Swelling and Other (See Comments)    Swelling to eyes   Augmentin [Amoxicillin-Pot Clavulanate] Other (See Comments)    Headache, dizzy   Ciprofloxacin Hives    Patient Measurements: Height: 5\' 4"  (162.6 cm) Weight: 100.1 kg (220 lb 10.9 oz) IBW/kg (Calculated) : 54.7 Heparin Dosing Weight: 68.3 kg  Vital Signs: Temp: 98.3 F (36.8 C) (05/18 0450) Temp Source: Oral (05/18 0450) BP: 132/66 (05/18 0450) Pulse Rate: 91 (05/18 0450)  Labs: Recent Labs    01/06/22 0558 01/06/22 1516 01/07/22 0500 01/08/22 0532  HGB  --  9.1* 9.1*  --   HCT  --  28.9* 29.0*  --   PLT  --  221 197  --   CREATININE 0.98  --  0.89 0.96    Estimated Creatinine Clearance: 65.4 mL/min (by C-G formula based on SCr of 0.96 mg/dL).   Medical History: Past Medical History:  Diagnosis Date   Anemia    Anxiety    HX PANIC ATTACKS   Arthritis    "starting to; in my hands" (07/09/2015)   Asthma    Atrial fibrillation (HCC)    Atrial flutter, paroxysmal (HCC)    Bloated abdomen    CFS (chronic fatigue syndrome)    Chewing difficulty    Chronic asthma with acute exacerbation    "I have chronic asthma all the time; sometimes exacerbations" (07/09/2015)   Chronic lower back pain    COPD (chronic obstructive pulmonary disease) ([place])    Cyst of right kidney    "3 of them; dx'd in ~ 01/2015"   Dyspnea    GERD (gastroesophageal reflux disease)    Heart murmur    History of blood transfusion    "related to my brain surgery I think"   History of pulmonary embolism 07/09/2015   History of radiation therapy 04/09/20-04/22/20   SBRT Left Lung, Dr. Gery Pray   History of radiation therapy    lumbar spine  10/08/2021-10/28/2021  Dr Gery Pray   HIV disease [facility])    Hyperlipidemia    Hypertension    Leg edema    Lipodystrophy    Mild CAD 2013   Multiple thyroid nodules    Osteopenia    Palpitations    Pneumonia 07/09/2015   Shingles    Vitamin B 12 deficiency    Vitamin D deficiency     Medications:  Prior to admission Xarelto 20mg  qday on hold due to oozing from wound; LD 01/05/2022.  Assessment: 66 year old female with a history of metastatic adenocarcinoma of lung, COPD, HIV, was admitted to the hospital with right lower extremity wound, necrotizing fascitis.  Underwent rt AKA at [facility] on 12/26/2021.  Post op with anemia needing prbc transfusions x2.  Wound vac recently removed and noted to be oozing blood and Xarelto placed on hold.  Today, Pharmacy consulted to dose IV heparin with plans to transition to Xarelto when clinically appropriate.  Addendum: Baseline aPTT low at 23 and Heparin level elevated at 0.41 and do not correlate due to effects of rivaroxaban  Goal of Therapy:  Heparin level 0.3-0.7 units/ml Monitor platelets by anticoagulation protocol: Yes   Plan:  Obtain baseline aPTT, heparin level, INR  - use aPTT until aPTT and heparin  level correlates No heparin bolus Start heparin infusion at 1000 units/hr Check aPTT in 6 hours and daily until able to use heparin level Monitor daily heparin level, CBC, signs/symptoms of bleeding   Royetta Asal, PharmD, [place] Please utilize Amion for appropriate phone number to reach the unit pharmacist ([place]) 01/08/2022 1:20 PM

## 2022-01-08 NOTE — Plan of Care (Signed)
Uneventful day, OOB to bedside recliner via hoya lift. Remains HDS, in no apparent distress. Started on heparin ggts, no hematuria, or overt s/sx of bleeding. Reports missing upper denture, however, NT reports only bottom teeth were in retaining container this a.m. Problem: Health Behavior/Discharge Planning: Goal: Ability to manage health-related needs will improve Outcome: Progressing   Problem: Clinical Measurements: Goal: Will remain free from infection Outcome: Progressing Goal: Diagnostic test results will improve Outcome: Progressing Goal: Respiratory complications will improve Outcome: Progressing   Problem: Activity: Goal: Risk for activity intolerance will decrease Outcome: Progressing   Problem: Nutrition: Goal: Adequate nutrition will be maintained Outcome: Progressing

## 2022-01-08 NOTE — Progress Notes (Signed)
Progress Note   Patient: Shannon [name] IZT:[id number] DOB: 08-31-1955 DOA: 12/17/2021     22 DOS: the patient was seen and examined on 01/08/2022   Brief hospital course: 66 year old female with a history of metastatic adenocarcinoma of lung, COPD, HIV, was admitted to the hospital with right lower extremity wound, necrotizing fascitis,seen by orthopedics and underwent incision and drainage.  He was noted to have fungemia.  Infectious disease following for antibiotics/antifungals. Overall, her condition worsened with worsening mental status and respiratory status.She required intubation and transfer to ICU.  She was treated for COPD exacerbation and since then, respiratory status has stabilized and she was extubated on 5/1.  Orthopedics following for gas gangrene/necrotizing fasciitis of her right lower extremity and plans are for amputation, underwent Rt AKA at [facility] 12/26/21. Post op with anemia needing prbc transfusions x2. PTOT working and advised CIR   Assessment and Plan: No notes have been filed under this hospital service. Service: Hospitalist Principal Problem:   Abscess of right foot, necrotizing fasciitis - Status post I&D on 12/20/2021 with OR culture revealing Klebsiella Proteus and Enterococcus - Status post right AKA on 5/5 - Completed Unasyn - repeat UA is negative, WBC count normal, using the BiPAP at night - changed Oxycodone to Morphine and stopped Neurontin on 5/13 secondary to confusion - Wound vac recently removed- Recently noted to be oozing blood-  Dr. Wynelle Cleveland d/w Dr Sharol Given at that time who recommends holding Xarelto -See below, Neurology has recommended resuming anticoagulation sooner if possible. No further bleed this AM. Will give trial of heparin gtt today. If still no bleed, then would  transition to xarelto tomorrow   Active Problems:  Right forearm fascial biopsy and fasciotomy- right hand and arm edema - suspicion of infected arm-infection ruled out- biopsy revealed  dense fibroadipose tissue - Dr. Wynelle Cleveland d/w Dr Caralyn Guile who recommends to continue dressings- iodoform gauze being used   New finding of subacute CVA -Due to confusion, MRI of the brain was obtained - Found to have a 1.6 cm evolving acute to early subacute ischemic infarct in the right anterior temporal lobe without any associated hemorrhage or mass effect - neurology consulted, recommended MRI with contrast- unfortunately this has a great deal of motion artifact -Neurology has since recommended repeat MRI brain in 4 weeks w/ contrast to assess change  -Neuro recs to continue Biktarvy and monitor CD4 and avoid deliriogenic meds    Fungemia -Suspected to be as a result of the wound infection - Diflucan to be completed on 5/15 - ID has signed off - Ophthalmology eval completed on 5/9-no eye involvement noted    Acute respiratory failure with hypoxia and hypercapnia (HCC) COPD exacerbation Probable OSA - Failed BiPAP and required intubation - Exacerbation has resolved - she is using BiPAP during sleep in the hospital -Is being treated with albuterol nebs as needed, Brovana twice daily, Pulmicort twice daily, prednisone taper, revefenacin neb daily - Continue incentive spirometry - Recent CXR reviewed, findings of unchanged L perihilar opacities with fiduciary markers, PICC in place   Acute metabolic encephalopathy - RPR non reactive - Vit B12 normal - MRI brain suggestive of CVA as above - Awake, conversing appropriately, seems much improved     HIV disease ([place]) - Continue Biktarvy - CD4 count on 11/25/2021 was 143 - repeat CD 4 count of 68   DM 2 - sugars worsened by Decadron - A1c is 7.3 as of 12/17/21 - on Levemir and Novolog     Uncontrolled  hypertension   (HFpEF) heart failure with preserved ejection fraction (HCC) -Currently receiving carvedilol 12.5 mg twice daily, diltiazem 360 mg, hydralazine 25 mg 3 times daily, spironolactone 25 mg daily  -Current medications include  carvedilol, diltiazem, spironolactone and hydralazine IV    Morbid (severe) obesity due to excess calories (HCC) Body mass index is 37.88 kg/m.     Atrial flutter (HCC) - Rate controlled on Cardizem and Coreg - on heparin currently, would transition to xarelto 5/19 if no further bleed   Acute blood loss anemia with macrocytosis - Status post 1 unit of packed red blood cell transfusion on 5/7 and a second unit on 5/28 - Hemoglobin has been stable since    Adenocarcinoma of lung [facility]), recurrent metastatic non-small cell lung cancer - Status post curative radiotherapy to left upper lobe lung nodules-8/21 - sadly, she has had a recurrence as of Feb 2023> metastatic disease involving several areas of the bones including the ribs as well as thoracic spine and lumbar spine as well as sacrum.  She also has numerous small solid bilateral pulmonary nodules. - underwent CT-guided core biopsy of the metastatic lesion of the posterior right ninth rib and the final pathology was consistent with adenocarcinoma of the lung. - Follows with Dr. Earlie Server - she missed an appointment last week and I have requested for her to be evaluated in the hospital and receive another appointment - on Decadron per oncology (for bony mets)- on PPI for GI protection   Diarrhea - resolved- rectal tube  removed   Thrombocytopenia -  resolved       Subjective: Without complaints today. In good spirits. No bleeding from LE wound  Physical Exam: Vitals:   01/07/22 2241 01/08/22 0450 01/08/22 0806 01/08/22 1450  BP:  132/66  139/80  Pulse: 63 91  (!) 47  Resp: 16 18  18   Temp:  98.3 F (36.8 C)  98.5 F (36.9 C)  TempSrc:  Oral  Oral  SpO2: 95% 99% 99% 98%  Weight:      Height:       General exam: Conversant, in no acute distress Respiratory system: normal chest rise, clear, no audible wheezing Cardiovascular system: regular rhythm, s1-s2 Gastrointestinal system: Nondistended, nontender, pos BS Central  nervous system: No seizures, no tremors Extremities: No cyanosis, no joint deformities, RLE s/p AKA with dry dressings in place Skin: No rashes, no pallor Psychiatry: Affect normal // no auditory hallucinations   Data Reviewed:  Labs reviewed: Cr 0.96,   Family Communication: Pt in room, family not at bedside  Disposition: Status is: Inpatient Remains inpatient appropriate because: Severity of illness  Planned Discharge Destination: Skilled nursing facility    Author: Marylu Lund, MD 01/08/2022 4:42 PM  For on call review [email or web address].

## 2022-01-08 NOTE — Progress Notes (Signed)
ANTICOAGULATION CONSULT NOTE - Initial Consult  Pharmacy Consult for IV Heparin Indication: Stroke, anticoagulation per Neurology.  History of atrial flutter and Xarelto on hold.  Allergies  Allergen Reactions   Lisinopril Swelling and Cough    Face/throat swelling   Tree Extract Swelling and Other (See Comments)    Swelling to eyes   Augmentin [Amoxicillin-Pot Clavulanate] Other (See Comments)    Headache, dizzy   Ciprofloxacin Hives    Patient Measurements: Height: 5\' 4"  (162.6 cm) Weight: 100.1 kg (220 lb 10.9 oz) IBW/kg (Calculated) : 54.7 Heparin Dosing Weight: 68.3 kg  Vital Signs: Temp: 98.5 F (36.9 C) (05/18 1450) Temp Source: Oral (05/18 1450) BP: 139/80 (05/18 1450) Pulse Rate: 47 (05/18 1450)  Labs: Recent Labs    01/06/22 0558 01/06/22 1516 01/07/22 0500 01/08/22 0532 01/08/22 1147 01/08/22 2055  HGB  --  9.1* 9.1*  --   --   --   HCT  --  28.9* 29.0*  --   --   --   PLT  --  221 197  --   --   --   APTT  --   --   --   --  23* 45*  LABPROT  --   --   --   --  14.9  --   INR  --   --   --   --  1.2  --   HEPARINUNFRC  --   --   --   --  0.41  --   CREATININE 0.98  --  0.89 0.96  --   --      Estimated Creatinine Clearance: 65.4 mL/min (by C-G formula based on SCr of 0.96 mg/dL).   Medical History: Past Medical History:  Diagnosis Date   Anemia    Anxiety    HX PANIC ATTACKS   Arthritis    "starting to; in my hands" (07/09/2015)   Asthma    Atrial fibrillation (HCC)    Atrial flutter, paroxysmal (HCC)    Bloated abdomen    CFS (chronic fatigue syndrome)    Chewing difficulty    Chronic asthma with acute exacerbation    "I have chronic asthma all the time; sometimes exacerbations" (07/09/2015)   Chronic lower back pain    COPD (chronic obstructive pulmonary disease) ([place])    Cyst of right kidney    "3 of them; dx'd in ~ 01/2015"   Dyspnea    GERD (gastroesophageal reflux disease)    Heart murmur    History of blood transfusion     "related to my brain surgery I think"   History of pulmonary embolism 07/09/2015   History of radiation therapy 04/09/20-04/22/20   SBRT Left Lung, Dr. Gery Pray   History of radiation therapy    lumbar spine 10/08/2021-10/28/2021  Dr Gery Pray   HIV disease [facility])    Hyperlipidemia    Hypertension    Leg edema    Lipodystrophy    Mild CAD 2013   Multiple thyroid nodules    Osteopenia    Palpitations    Pneumonia 07/09/2015   Shingles    Vitamin B 12 deficiency    Vitamin D deficiency     Medications:  Prior to admission Xarelto 20mg  qday on hold due to oozing from wound; LD 01/05/2022.  Assessment: 66 year old female with a history of metastatic adenocarcinoma of lung, COPD, HIV, was admitted to the hospital with right lower extremity wound, necrotizing fascitis.  Underwent rt AKA at [facility]  on 12/26/2021.  Post op with anemia needing prbc transfusions x2.  Wound vac recently removed and noted to be oozing blood and Xarelto placed on hold.  Today, Pharmacy consulted to dose IV heparin with plans to transition to Xarelto when clinically appropriate.  PM: aPTT low at 45    Goal of Therapy:  Heparin level 0.3-0.7 units/ml Monitor platelets by anticoagulation protocol: Yes   Plan:  Increase heparin infusion at 1200 units/hr Check aPTT in 6 hours and daily until able to use heparin level Monitor daily heparin level, CBC, signs/symptoms of bleeding   Royetta Asal, PharmD, BCPS 01/08/2022 9:32 PM

## 2022-01-09 LAB — COMPREHENSIVE METABOLIC PANEL
ALT: 28 U/L (ref 0–44)
AST: 14 U/L — ABNORMAL LOW (ref 15–41)
Albumin: 2.7 g/dL — ABNORMAL LOW (ref 3.5–5.0)
Alkaline Phosphatase: 78 U/L (ref 38–126)
Anion gap: 4 — ABNORMAL LOW (ref 5–15)
BUN: 51 mg/dL — ABNORMAL HIGH (ref 8–23)
CO2: 40 mmol/L — ABNORMAL HIGH (ref 22–32)
Calcium: 10.2 mg/dL (ref 8.9–10.3)
Chloride: 94 mmol/L — ABNORMAL LOW (ref 98–111)
Creatinine, Ser: 1 mg/dL (ref 0.44–1.00)
GFR, Estimated: >60 mL/min (ref >60)
Glucose, Bld: 177 mg/dL — ABNORMAL HIGH (ref 70–99)
Potassium: 4.9 mmol/L (ref 3.5–5.1)
Sodium: 138 mmol/L (ref 135–145)
Total Bilirubin: 0.8 mg/dL (ref 0.3–1.2)
Total Protein: 5.4 g/dL — ABNORMAL LOW (ref 6.5–8.1)

## 2022-01-09 LAB — CBC
HCT: 31.7 % — ABNORMAL LOW (ref 36.0–46.0)
Hemoglobin: 9.6 g/dL — ABNORMAL LOW (ref 12.0–15.0)
MCH: 31 pg (ref 26.0–34.0)
MCHC: 30.3 g/dL (ref 30.0–36.0)
MCV: 102.3 fL — ABNORMAL HIGH (ref 80.0–100.0)
Platelets: 182 10*3/uL (ref 150–400)
RBC: 3.1 MIL/uL — ABNORMAL LOW (ref 3.87–5.11)
RDW: 15.9 % — ABNORMAL HIGH (ref 11.5–15.5)
WBC: 8.9 10*3/uL (ref 4.0–10.5)
nRBC: 0.3 % — ABNORMAL HIGH (ref 0.0–0.2)

## 2022-01-09 LAB — APTT: aPTT: 95 seconds — ABNORMAL HIGH (ref 24–36)

## 2022-01-09 LAB — GLUCOSE, CAPILLARY
Glucose-Capillary: 153 mg/dL — ABNORMAL HIGH (ref 70–99)
Glucose-Capillary: 161 mg/dL — ABNORMAL HIGH (ref 70–99)
Glucose-Capillary: 197 mg/dL — ABNORMAL HIGH (ref 70–99)
Glucose-Capillary: 208 mg/dL — ABNORMAL HIGH (ref 70–99)

## 2022-01-09 LAB — HEPARIN LEVEL (UNFRACTIONATED): Heparin Unfractionated: 1.05 IU/mL — ABNORMAL HIGH (ref 0.30–0.70)

## 2022-01-09 MED ORDER — RIVAROXABAN 20 MG PO TABS
20.0000 mg | ORAL_TABLET | Freq: Every day | ORAL | Status: DC
  Administered 2022-01-09 – 2022-01-13 (×5): 20 mg via ORAL
  Filled 2022-01-09 (×5): qty 1

## 2022-01-09 NOTE — Progress Notes (Addendum)
[place] for Xarelto Indication:  Transition off IV heparin and restart Xarelto for new finding of subacute CVA, hx atrial flutter ; previously on Xarelto  Allergies  Allergen Reactions   Lisinopril Swelling and Cough    Face/throat swelling   Tree Extract Swelling and Other (See Comments)    Swelling to eyes   Augmentin [Amoxicillin-Pot Clavulanate] Other (See Comments)    Headache, dizzy   Ciprofloxacin Hives    Patient Measurements: Height: 5\' 4"  (162.6 cm) Weight: 93.5 kg (206 lb 2.1 oz) IBW/kg (Calculated) : 54.7  Vital Signs: Temp: 97.8 F (36.6 C) (05/19 0516) Temp Source: Oral (05/19 0516) BP: 131/80 (05/19 0516) Pulse Rate: 65 (05/19 0859)  Labs: Recent Labs    01/06/22 1516 01/07/22 0500 01/08/22 0532 01/08/22 1147 01/08/22 2055 01/09/22 0420  HGB 9.1* 9.1*  --   --   --   --   HCT 28.9* 29.0*  --   --   --   --   PLT 221 197  --   --   --   --   APTT  --   --   --  23* 45* 95*  LABPROT  --   --   --  14.9  --   --   INR  --   --   --  1.2  --   --   HEPARINUNFRC  --   --   --  0.41  --  1.05*  CREATININE  --  0.89 0.96  --   --  1.00    Estimated Creatinine Clearance: 60.5 mL/min (by C-G formula based on SCr of 1 mg/dL).   Medical History: Past Medical History:  Diagnosis Date   Anemia    Anxiety    HX PANIC ATTACKS   Arthritis    "starting to; in my hands" (07/09/2015)   Asthma    Atrial fibrillation (HCC)    Atrial flutter, paroxysmal (HCC)    Bloated abdomen    CFS (chronic fatigue syndrome)    Chewing difficulty    Chronic asthma with acute exacerbation    "I have chronic asthma all the time; sometimes exacerbations" (07/09/2015)   Chronic lower back pain    COPD (chronic obstructive pulmonary disease) (HCC)    Cyst of right kidney    "3 of them; dx'd in ~ 01/2015"   Dyspnea    GERD (gastroesophageal reflux disease)    Heart murmur    History of blood transfusion    "related to my brain  surgery I think"   History of pulmonary embolism 07/09/2015   History of radiation therapy 04/09/20-04/22/20   SBRT Left Lung, Dr. Gery Pray   History of radiation therapy    lumbar spine 10/08/2021-10/28/2021  Dr Gery Pray   HIV disease [facility])    Hyperlipidemia    Hypertension    Leg edema    Lipodystrophy    Mild CAD 2013   Multiple thyroid nodules    Osteopenia    Palpitations    Pneumonia 07/09/2015   Shingles    Vitamin B 12 deficiency    Vitamin D deficiency     Medications:  PTA Xarelto 20 mg daily  Assessment: 66 year old female with a history of metastatic adenocarcinoma of lung, COPD, HIV, was admitted to the hospital with right lower extremity wound, necrotizing fascitis.  Underwent rt AKA at [facility] on 12/26/2021.  Post op with anemia needing prbc transfusions x2.  Wound vac recently removed  and noted to be oozing blood and Xarelto placed on hold. On 5/18, Pharmacy consulted to dose IV heparin with plans to transition to Xarelto when clinically appropriate.  Today, Pharmacy has been consulted to transition patient off IV heparin and restart Xarelto.   Goal of Therapy:  Prevent stroke and systemic embolism Monitor platelets by anticoagulation protocol: Yes   Plan:  Discontinue IV heparin At time that IV heparin is stopped, start Xarelto 20 mg po qday Pharmacy will sign off consult but will continue to monitor CBC, signs/symptoms of bleeding   Thank you, Royetta Asal, PharmD, [place] Please utilize Amion for appropriate phone number to reach the unit pharmacist ([place]) 01/09/2022 11:13 AM

## 2022-01-09 NOTE — Progress Notes (Signed)
Occupational Therapy Treatment Patient Details Name: Shannon [name] MRN: [id number] DOB: 1956-08-14 Today's Date: 01/09/2022   History of present illness Shannon [name] is a 66 y.o. woman with metastatic non-small cell lung cancer, asthma/COPD, GERD, HTN, and HIV who was admitted 12/17/21  due to right lower extremity wound  S/P irrigation and debridement of right foot and calf wounds on 12/17/21 .  On 4/28 she developed swelling of her right upper extremity and went for incision and drainage morning of 4/29 without overt signs of necrotizing fasciitis. She developed progressive mental status changes 4/28 to 4/29 with obtundation. She was transferred to the ICU and PCCM consulted for hypercapnic respiratory failure.  Intubated 4/29 and extubated 12/22/21. Pt underwent Rt AKA on 12/26/21.   OT comments  Patient able to perform rolling and supine to sit with bed rails and supervision. Intention was to transfer to recliner and perform hair care via anterior to posterior transfer. Patient able to scoot back across the bed but before reaching chair reported needing to have a BM. Patient returned to mostly supine - she would not despite multiple instructions - lay  on her back. Bed pan positioned underneath her. In regards to mobility patient scooting better. But BM limited further treatment. Patient still confused. Does not appear to remember therapist from previous visits. Her memory is impaired as well as her awareness of recent events. Follows most commands however. Cont POC.   Recommendations for follow up therapy are one component of a multi-disciplinary discharge planning process, led by the attending physician.  Recommendations may be updated based on patient status, additional functional criteria and insurance authorization.    Follow Up Recommendations  Skilled nursing-short term rehab (<3 hours/day)    Assistance Recommended at Discharge Frequent or constant Supervision/Assistance  Patient can return  home with the following  Assistance with cooking/housework;Direct supervision/assist for medications management;Direct supervision/assist for financial management;Assist for transportation;Help with stairs or ramp for entrance;Two people to help with walking and/or transfers;A lot of help with bathing/dressing/bathroom   Equipment Recommendations  Other (comment) ([place])    Recommendations for Other Services      Precautions / Restrictions Precautions Precautions: Fall Precaution Comments: R AKA, moves quick - be close Required Braces or Orthoses: Other Brace Splint/Cast: none Restrictions Weight Bearing Restrictions: Yes Other Position/Activity Restrictions: New R AKA       Mobility Bed Mobility Overal bed mobility: Needs Assistance   Rolling: Supervision   Supine to sit: Supervision     General bed mobility comments: Patient able to roll and sit up using bed rails with superivsion.    Transfers Overall transfer level: Needs assistance   Transfers: Bed to chair/wheelchair/BSC         Anterior-Posterior transfers: Supervision   General transfer comment: Patient able to perform AP transfer with supervision and verbal cues only - but unable to complete due to need for BM.     Balance                                           ADL either performed or assessed with clinical judgement   ADL Overall ADL's : Needs assistance/impaired  Extremity/Trunk Assessment Upper Extremity Assessment Upper Extremity Assessment: Overall WFL for tasks assessed            Vision Patient Visual Report: No change from baseline     Perception     Praxis      Cognition Arousal/Alertness: Awake/alert Behavior During Therapy: WFL for tasks assessed/performed Overall Cognitive Status: Impaired/Different from baseline Area of Impairment: Memory, Safety/judgement, Following commands, Awareness,  Problem solving, Attention                 Orientation Level: Time, Disoriented to Current Attention Level: Sustained Memory: Decreased short-term memory, Decreased recall of precautions Following Commands: Follows one step commands with increased time Safety/Judgement: Decreased awareness of safety, Decreased awareness of deficits Awareness: Emergent Problem Solving: Requires verbal cues, Requires tactile cues, Difficulty sequencing, Decreased initiation, Slow processing General Comments: Able to follow commands with increased time or reinstruction. Perseverates on saying "yes" throughout treatment. Impulsive. Needed reintroduction to therapist and need for therapy. Went over how and why to use call button. required written note "to call for help." When asked to demonstrate the use of the call button she didn't know how but kept tapping the smiley face on the hand written note. She demonstrated once effectively before therapist left the room.        Exercises      Shoulder Instructions       General Comments      Pertinent Vitals/ Pain       Pain Assessment Pain Assessment: No/denies pain  Home Living                                          Prior Functioning/Environment              Frequency  Min 2X/week        Progress Toward Goals  OT Goals(current goals can now be found in the care plan section)  Progress towards OT goals: Progressing toward goals  Acute Rehab OT Goals Patient Stated Goal: get better OT Goal Formulation: Patient unable to participate in goal setting Time For Goal Achievement: 01/12/22 Potential to Achieve Goals: Good  Plan Discharge plan remains appropriate    Co-evaluation          OT goals addressed during session: Other (comment) (functonal mobility)      AM-PAC OT "6 Clicks" Daily Activity     Outcome Measure   Help from another person eating meals?: A Little Help from another person taking care of  personal grooming?: A Little Help from another person toileting, which includes using toliet, bedpan, or urinal?: Total Help from another person bathing (including washing, rinsing, drying)?: A Lot Help from another person to put on and taking off regular upper body clothing?: A Little Help from another person to put on and taking off regular lower body clothing?: A Lot 6 Click Score: 14    End of Session Equipment Utilized During Treatment: Oxygen  OT Visit Diagnosis: Other abnormalities of gait and mobility (R26.89)   Activity Tolerance Patient tolerated treatment well   Patient Left in chair;with call bell/phone within reach;with chair alarm set   Nurse Communication  (bed pan)        Time: 2979-8921 OT Time Calculation (min): 20 min  Charges: OT General Charges $OT Visit: 1 Visit OT Treatments $Therapeutic Activity: 8-22 mins  Kiari Hosmer, OTR/L Acute Care Rehab Services  Office [phone] Pager: [place] 01/09/2022, 4:14 PM

## 2022-01-09 NOTE — Progress Notes (Signed)
Progress Note   Patient: Shannon [name] XBL:[id number] DOB: Oct 26, 1955 DOA: 12/17/2021     23 DOS: the patient was seen and examined on 01/09/2022   Brief hospital course: 66 year old female with a history of metastatic adenocarcinoma of lung, COPD, HIV, was admitted to the hospital with right lower extremity wound, necrotizing fascitis,seen by orthopedics and underwent incision and drainage.  He was noted to have fungemia.  Infectious disease following for antibiotics/antifungals. Overall, her condition worsened with worsening mental status and respiratory status.She required intubation and transfer to ICU.  She was treated for COPD exacerbation and since then, respiratory status has stabilized and she was extubated on 5/1.  Orthopedics following for gas gangrene/necrotizing fasciitis of her right lower extremity and plans are for amputation, underwent Rt AKA at [facility] [place] 12/26/21. Post op with anemia needing prbc transfusions x2. PTOT working and advised CIR   Assessment and Plan: No notes have been filed under this hospital service. Service: Hospitalist Principal Problem:   Abscess of right foot, necrotizing fasciitis - Status post I&D on 12/20/2021 with OR culture revealing Klebsiella Proteus and Enterococcus - Status post right AKA on 5/5 - Completed Unasyn - repeat UA is negative, WBC count normal, using the BiPAP at night - changed Oxycodone to Morphine and stopped Neurontin on 5/13 secondary to confusion - Wound vac recently removed- Recently noted to be oozing blood-  Dr. Wynelle Cleveland d/w Dr Sharol Given at that time who recommends holding Xarelto -See below, Neurology has recommended resuming anticoagulation sooner if possible.  -No further bleeding noted. Will transition to [place]   Active Problems:  Right forearm fascial biopsy and fasciotomy- right hand and arm edema - suspicion of infected arm-infection ruled out- biopsy revealed dense fibroadipose tissue - Dr. Wynelle Cleveland d/w Dr Caralyn Guile who recommends to  continue dressings- iodoform gauze being used   New finding of subacute CVA -Due to confusion, MRI of the brain was obtained - Found to have a 1.6 cm evolving acute to early subacute ischemic infarct in the right anterior temporal lobe without any associated hemorrhage or mass effect - neurology consulted, recommended MRI with contrast- unfortunately this has a great deal of motion artifact -Neurology has since recommended repeat MRI brain in 4 weeks w/ contrast to assess change  -Neuro recs to continue Biktarvy and monitor CD4 and avoid deliriogenic meds    Fungemia -Suspected to be as a result of the wound infection - Diflucan to be completed on 5/15 - ID has signed off - Ophthalmology eval completed on 5/9-no eye involvement noted    Acute respiratory failure with hypoxia and hypercapnia (HCC) COPD exacerbation Probable OSA - Failed BiPAP and required intubation - Exacerbation has resolved - she is using BiPAP during sleep in the hospital -Is being treated with albuterol nebs as needed, Brovana twice daily, Pulmicort twice daily, prednisone taper, revefenacin neb daily - Continue incentive spirometry - Recent CXR reviewed, findings of unchanged L perihilar opacities with fiduciary markers, PICC in place   Acute metabolic encephalopathy - RPR non reactive - Vit B12 normal - MRI brain suggestive of CVA as above - Awake, conversing appropriately, seems much improved     HIV disease ([place]) - Continue Biktarvy - CD4 count on 11/25/2021 was 143 - repeat CD 4 count of 68   DM 2 - sugars worsened by Decadron - A1c is 7.3 as of 12/17/21 - on Levemir and Novolog     Uncontrolled hypertension   (HFpEF) heart failure with preserved ejection fraction (HCC) -Currently receiving carvedilol  12.5 mg twice daily, diltiazem 360 mg, hydralazine 25 mg 3 times daily, spironolactone 25 mg daily  -Current medications include carvedilol, diltiazem, spironolactone and hydralazine IV    Morbid  (severe) obesity due to excess calories (HCC) Body mass index is 37.88 kg/m.     Atrial flutter (HCC) - Rate controlled on Cardizem and Coreg - on heparin currently, would transition to xarelto 5/19 if no further bleed   Acute blood loss anemia with macrocytosis - Status post 1 unit of packed red blood cell transfusion on 5/7 and a second unit on 5/28 - Hemoglobin has been stable since    Adenocarcinoma of lung [facility]), recurrent metastatic non-small cell lung cancer - Status post curative radiotherapy to left upper lobe lung nodules-8/21 - sadly, she has had a recurrence as of Feb 2023> metastatic disease involving several areas of the bones including the ribs as well as thoracic spine and lumbar spine as well as sacrum.  She also has numerous small solid bilateral pulmonary nodules. - underwent CT-guided core biopsy of the metastatic lesion of the posterior right ninth rib and the final pathology was consistent with adenocarcinoma of the lung. - Follows with Dr. Earlie Server - she missed an appointment last week and I have requested for her to be evaluated in the hospital and receive another appointment - on Decadron per oncology (for bony mets)- on PPI for GI protection   Diarrhea - resolved- rectal tube  removed   Thrombocytopenia -  resolved       Subjective: Without complaints this AM. No further bleeding from wound site  Physical Exam: Vitals:   01/09/22 0518 01/09/22 0859 01/09/22 0906 01/09/22 1646  BP:    108/65  Pulse:  65  (!) 54  Resp:  15    Temp:      TempSrc:      SpO2:   96% 97%  Weight: 93.5 kg     Height:       General exam: Awake, laying in bed, in nad Respiratory system: Normal respiratory effort, no wheezing Cardiovascular system: regular rate, s1, s2 Gastrointestinal system: Soft, nondistended, positive BS Central nervous system: CN2-12 grossly intact, strength intact Extremities: Perfused, no clubbing Skin: Normal skin turgor, no notable skin lesions  seen Psychiatry: Mood normal // no visual hallucinations   Data Reviewed:  Labs reviewed: Cr 1.0,   Family Communication: Pt in room, family not at bedside  Disposition: Status is: Inpatient Remains inpatient appropriate because: Severity of illness  Planned Discharge Destination: Skilled nursing facility    Author: Marylu Lund, MD 01/09/2022 6:46 PM  For on call review [email or web address].

## 2022-01-09 NOTE — TOC Progression Note (Signed)
Transition of Care [facility]) - Progression Note    Patient Details  Name: Shannon [name] MRN: [id number] Date of Birth: April 06, 1956  [facility] CM/SW Contact  Mariateresa Batra, Marjie Skiff, RN Phone Number: 01/09/2022, 11:10 AM  Clinical Narrative:    Per MD pt likely will be ready to dc to SNF over the weekend. New insurance auth for SNF started with [facility] [id number]. [employer] liaison did confirm that they could accept her over the weekend. TOC will continue to follow.   Expected Discharge Plan: [place] Barriers to Discharge: Continued Medical Work up, SNF Pending bed offer  Expected Discharge Plan and Services Expected Discharge Plan: [place]   Discharge Planning Services: CM Consult Post Acute Care Choice: [place] Living arrangements for the past 2 months: Apartment                   Readmission Risk Interventions    12/22/2021    9:44 AM  Readmission Risk Prevention Plan  Transportation Screening Complete  Medication Review (RN Care Manager) Complete  HRI or Home Care Consult Complete  SW Recovery Care/Counseling Consult Complete  Palliative Care Screening Not Applicable  Skilled Nursing Facility Complete

## 2022-01-09 NOTE — Progress Notes (Signed)
ANTICOAGULATION CONSULT NOTE - follow up  Pharmacy Consult for IV Heparin Indication: Stroke, anticoagulation per Neurology.  History of atrial flutter and Xarelto on hold.  Allergies  Allergen Reactions   Lisinopril Swelling and Cough    Face/throat swelling   Tree Extract Swelling and Other (See Comments)    Swelling to eyes   Augmentin [Amoxicillin-Pot Clavulanate] Other (See Comments)    Headache, dizzy   Ciprofloxacin Hives    Patient Measurements: Height: 5\' 4"  (162.6 cm) Weight: 100.1 kg (220 lb 10.9 oz) IBW/kg (Calculated) : 54.7 Heparin Dosing Weight: 68.3 kg  Vital Signs: Temp: 97.8 F (36.6 C) (05/18 2148) Temp Source: Oral (05/18 2148) BP: 165/80 (05/18 2148) Pulse Rate: 96 (05/18 2148)  Labs: Recent Labs    01/06/22 1516 01/07/22 0500 01/08/22 0532 01/08/22 1147 01/08/22 2055 01/09/22 0420  HGB 9.1* 9.1*  --   --   --   --   HCT 28.9* 29.0*  --   --   --   --   PLT 221 197  --   --   --   --   APTT  --   --   --  23* 45* 95*  LABPROT  --   --   --  14.9  --   --   INR  --   --   --  1.2  --   --   HEPARINUNFRC  --   --   --  0.41  --  1.05*  CREATININE  --  0.89 0.96  --   --  1.00     Estimated Creatinine Clearance: 62.8 mL/min (by C-G formula based on SCr of 1 mg/dL).   Medical History: Past Medical History:  Diagnosis Date   Anemia    Anxiety    HX PANIC ATTACKS   Arthritis    "starting to; in my hands" (07/09/2015)   Asthma    Atrial fibrillation (HCC)    Atrial flutter, paroxysmal (HCC)    Bloated abdomen    CFS (chronic fatigue syndrome)    Chewing difficulty    Chronic asthma with acute exacerbation    "I have chronic asthma all the time; sometimes exacerbations" (07/09/2015)   Chronic lower back pain    COPD (chronic obstructive pulmonary disease) ([place])    Cyst of right kidney    "3 of them; dx'd in ~ 01/2015"   Dyspnea    GERD (gastroesophageal reflux disease)    Heart murmur    History of blood transfusion    "related  to my brain surgery I think"   History of pulmonary embolism 07/09/2015   History of radiation therapy 04/09/20-04/22/20   SBRT Left Lung, Dr. Gery Pray   History of radiation therapy    lumbar spine 10/08/2021-10/28/2021  Dr Gery Pray   HIV disease [facility])    Hyperlipidemia    Hypertension    Leg edema    Lipodystrophy    Mild CAD 2013   Multiple thyroid nodules    Osteopenia    Palpitations    Pneumonia 07/09/2015   Shingles    Vitamin B 12 deficiency    Vitamin D deficiency     Medications:  Prior to admission Xarelto 20mg  qday on hold due to oozing from wound; LD 01/05/2022.  Assessment: 66 year old female with a history of metastatic adenocarcinoma of lung, COPD, HIV, was admitted to the hospital with right lower extremity wound, necrotizing fascitis.  Underwent rt AKA at [facility] on 12/26/2021.  Post  op with anemia needing prbc transfusions x2.  Wound vac recently removed and noted to be oozing blood and Xarelto placed on hold.  Today, Pharmacy consulted to dose IV heparin with plans to transition to Xarelto when clinically appropriate.  01/09/2022 aPTT 95 therapeutic on 1200 units/hr HL elevated as expected with xarelto still on board Per RN no bleeding or line issues   Goal of Therapy:  Heparin level 0.3-0.7 units/ml Monitor platelets by anticoagulation protocol: Yes   Plan:  Continue heparin drip at 1200 units/hr Check aPTT in 6 hours and daily until able to use heparin level Monitor daily heparin level, CBC, signs/symptoms of bleeding   Dolly Rias RPh 01/09/2022, 5:00 AM

## 2022-01-09 NOTE — Care Management Important Message (Signed)
Important Message  Patient Details IM Letter given to the Patient. Name: Shannon [name] MRN: [id number] Date of Birth: August 30, 1955   Medicare Important Message Given:  Yes     Kerin Salen 01/09/2022, 11:18 AM

## 2022-01-10 LAB — CBC
HCT: 29.3 % — ABNORMAL LOW (ref 36.0–46.0)
Hemoglobin: 9 g/dL — ABNORMAL LOW (ref 12.0–15.0)
MCH: 31.3 pg (ref 26.0–34.0)
MCHC: 30.7 g/dL (ref 30.0–36.0)
MCV: 101.7 fL — ABNORMAL HIGH (ref 80.0–100.0)
Platelets: 163 10*3/uL (ref 150–400)
RBC: 2.88 MIL/uL — ABNORMAL LOW (ref 3.87–5.11)
RDW: 15.8 % — ABNORMAL HIGH (ref 11.5–15.5)
WBC: 8.4 10*3/uL (ref 4.0–10.5)
nRBC: 0.2 % (ref 0.0–0.2)

## 2022-01-10 LAB — GLUCOSE, CAPILLARY
Glucose-Capillary: 144 mg/dL — ABNORMAL HIGH (ref 70–99)
Glucose-Capillary: 155 mg/dL — ABNORMAL HIGH (ref 70–99)
Glucose-Capillary: 217 mg/dL — ABNORMAL HIGH (ref 70–99)
Glucose-Capillary: 186 mg/dL — ABNORMAL HIGH (ref 70–99)

## 2022-01-10 LAB — BLOOD GAS, VENOUS
Acid-Base Excess: 13.5 mmol/L — ABNORMAL HIGH (ref 0.0–2.0)
Bicarbonate: 41.5 mmol/L — ABNORMAL HIGH (ref 20.0–28.0)
O2 Saturation: 93.6 %
Patient temperature: 36.4
pCO2, Ven: 65 mmHg — ABNORMAL HIGH (ref 44–60)
pH, Ven: 7.41 (ref 7.25–7.43)
pO2, Ven: 58 mmHg — ABNORMAL HIGH (ref 32–45)

## 2022-01-10 LAB — COMPREHENSIVE METABOLIC PANEL
ALT: 26 U/L (ref 0–44)
AST: 13 U/L — ABNORMAL LOW (ref 15–41)
Albumin: 2.6 g/dL — ABNORMAL LOW (ref 3.5–5.0)
Alkaline Phosphatase: 80 U/L (ref 38–126)
Anion gap: 5 (ref 5–15)
BUN: 51 mg/dL — ABNORMAL HIGH (ref 8–23)
CO2: 41 mmol/L — ABNORMAL HIGH (ref 22–32)
Calcium: 10.4 mg/dL — ABNORMAL HIGH (ref 8.9–10.3)
Chloride: 96 mmol/L — ABNORMAL LOW (ref 98–111)
Creatinine, Ser: 1.05 mg/dL — ABNORMAL HIGH (ref 0.44–1.00)
GFR, Estimated: 58 mL/min — ABNORMAL LOW (ref >60)
Glucose, Bld: 158 mg/dL — ABNORMAL HIGH (ref 70–99)
Potassium: 4.8 mmol/L (ref 3.5–5.1)
Sodium: 142 mmol/L (ref 135–145)
Total Bilirubin: 0.4 mg/dL (ref 0.3–1.2)
Total Protein: 5.1 g/dL — ABNORMAL LOW (ref 6.5–8.1)

## 2022-01-10 NOTE — Progress Notes (Signed)
Progress Note   Patient: Shannon [name] ERD:[id number] DOB: 28-Oct-1955 DOA: 12/17/2021     24 DOS: the patient was seen and examined on 01/10/2022   Brief hospital course: 66 year old female with a history of metastatic adenocarcinoma of lung, COPD, HIV, was admitted to the hospital with right lower extremity wound, necrotizing fascitis,seen by orthopedics and underwent incision and drainage.  He was noted to have fungemia.  Infectious disease following for antibiotics/antifungals. Overall, her condition worsened with worsening mental status and respiratory status.She required intubation and transfer to ICU.  She was treated for COPD exacerbation and since then, respiratory status has stabilized and she was extubated on 5/1.  Orthopedics following for gas gangrene/necrotizing fasciitis of her right lower extremity and plans are for amputation, underwent Rt AKA at [facility] 12/26/21. Post op with anemia needing prbc transfusions x2. PTOT working and advised CIR   Assessment and Plan: No notes have been filed under this hospital service. Service: Hospitalist Principal Problem:   Abscess of right foot, necrotizing fasciitis - Status post I&D on 12/20/2021 with OR culture revealing Klebsiella Proteus and Enterococcus - Status post right AKA on 5/5 - Completed Unasyn - changed Oxycodone to Morphine and stopped Neurontin on 5/13 secondary to confusion - Wound vac recently removed- Recently noted to be oozing blood with xarelto briefly held -Anticoagulation was later resumed with no further bleeding at this time   Active Problems:  Right forearm fascial biopsy and fasciotomy- right hand and arm edema - suspicion of infected arm-infection ruled out- biopsy revealed dense fibroadipose tissue - Dr. Wynelle Cleveland d/w Dr Caralyn Guile who recommends to continue dressings- iodoform gauze being used   New finding of subacute CVA -Due to confusion, MRI of the brain was obtained - Found to have a 1.6 cm evolving acute to early  subacute ischemic infarct in the right anterior temporal lobe without any associated hemorrhage or mass effect - neurology consulted, recommended MRI with contrast- unfortunately this has a great deal of motion artifact -Neurology has since recommended repeat MRI brain in 4 weeks w/ contrast to assess change  -Neuro recs to continue Biktarvy and monitor CD4 and avoid deliriogenic meds    Fungemia -Suspected to be as a result of the wound infection - Diflucan to be completed on 5/15 - ID has signed off - Ophthalmology eval completed on 5/9-no eye involvement noted    Acute respiratory failure with hypoxia and hypercapnia (HCC) COPD exacerbation Probable OSA - Failed BiPAP and required intubation -Is being treated with albuterol nebs as needed, Brovana twice daily, Pulmicort twice daily, prednisone taper, revefenacin neb daily - Continue incentive spirometry - Recent CXR reviewed, findings of unchanged L perihilar opacities with fiduciary markers, PICC in place -Serum bicarb has been steadily trending up to 41 today. Staff reports that patient has not been compliant with CPAP on floor, refusing to wear over the past few days -More confused. Will check VBG   Acute metabolic encephalopathy - RPR non reactive - Vit B12 normal - MRI brain suggestive of CVA as above - Awake, conversing appropriately, seems much improved     HIV disease ([place]) - Continue Biktarvy - CD4 count on 11/25/2021 was 143 - repeat CD 4 count of 68   DM 2 - sugars worsened by Decadron - A1c is 7.3 as of 12/17/21 - on Levemir and Novolog     Uncontrolled hypertension   (HFpEF) heart failure with preserved ejection fraction (HCC) -Currently receiving carvedilol 12.5 mg twice daily, diltiazem 360 mg, hydralazine 25  mg 3 times daily, spironolactone 25 mg daily  -Current medications include carvedilol, diltiazem, spironolactone and hydralazine IV    Morbid (severe) obesity due to excess calories (HCC) Body mass index  is 37.88 kg/m.     Atrial flutter (HCC) - Rate controlled on Cardizem and Coreg - on heparin currently, would transition to xarelto 5/19 if no further bleed   Acute blood loss anemia with macrocytosis - Status post 1 unit of packed red blood cell transfusion on 5/7 and a second unit on 5/28 - Hemoglobin has been stable since    Adenocarcinoma of lung [facility]), recurrent metastatic non-small cell lung cancer - Status post curative radiotherapy to left upper lobe lung nodules-8/21 - sadly, she has had a recurrence as of Feb 2023> metastatic disease involving several areas of the bones including the ribs as well as thoracic spine and lumbar spine as well as sacrum.  She also has numerous small solid bilateral pulmonary nodules. - underwent CT-guided core biopsy of the metastatic lesion of the posterior right ninth rib and the final pathology was consistent with adenocarcinoma of the lung. - Follows with Dr. Earlie Server - she missed an appointment last week and I have requested for her to be evaluated in the hospital and receive another appointment - on Decadron per oncology (for bony mets)- on PPI for GI protection   Diarrhea - resolved- rectal tube  removed   Thrombocytopenia -  resolved       Subjective: More confused today  Physical Exam: Vitals:   01/10/22 0821 01/10/22 0856 01/10/22 1420 01/10/22 1421  BP: 131/60  (!) 141/75   Pulse: (!) 56  63 60  Resp:   16   Temp:   (!) 97.5 F (36.4 C)   TempSrc:   Oral   SpO2:  100% (!) 88% 90%  Weight:      Height:       General exam: Conversant, in no acute distress Respiratory system: normal chest rise, clear, no audible wheezing Cardiovascular system: regular rhythm, s1-s2 Gastrointestinal system: Nondistended, nontender, pos BS Central nervous system: No seizures, no tremors Extremities: No cyanosis, no joint deformities Skin: No rashes, no pallor Psychiatry: Affect normal // no auditory hallucinations   Data  Reviewed:  Labs reviewed: Cr 1.05   Family Communication: Pt in room, family not at bedside  Disposition: Status is: Inpatient Remains inpatient appropriate because: Severity of illness  Planned Discharge Destination: Skilled nursing facility    Author: Marylu Lund, MD 01/10/2022 5:35 PM  For on call review [email or web address].

## 2022-01-11 LAB — COMPREHENSIVE METABOLIC PANEL
ALT: 25 U/L (ref 0–44)
AST: 14 U/L — ABNORMAL LOW (ref 15–41)
Albumin: 2.7 g/dL — ABNORMAL LOW (ref 3.5–5.0)
Alkaline Phosphatase: 72 U/L (ref 38–126)
Anion gap: 3 — ABNORMAL LOW (ref 5–15)
BUN: 53 mg/dL — ABNORMAL HIGH (ref 8–23)
CO2: 40 mmol/L — ABNORMAL HIGH (ref 22–32)
Calcium: 10.3 mg/dL (ref 8.9–10.3)
Chloride: 96 mmol/L — ABNORMAL LOW (ref 98–111)
Creatinine, Ser: 1.03 mg/dL — ABNORMAL HIGH (ref 0.44–1.00)
GFR, Estimated: 60 mL/min — ABNORMAL LOW (ref >60)
Glucose, Bld: 147 mg/dL — ABNORMAL HIGH (ref 70–99)
Potassium: 4.5 mmol/L (ref 3.5–5.1)
Sodium: 139 mmol/L (ref 135–145)
Total Bilirubin: 0.6 mg/dL (ref 0.3–1.2)
Total Protein: 5.3 g/dL — ABNORMAL LOW (ref 6.5–8.1)

## 2022-01-11 LAB — BLOOD GAS, VENOUS
Acid-Base Excess: 19.9 mmol/L — ABNORMAL HIGH (ref 0.0–2.0)
Bicarbonate: 48 mmol/L — ABNORMAL HIGH (ref 20.0–28.0)
Drawn by: 61904
O2 Saturation: 71.6 %
Patient temperature: 37.2
pCO2, Ven: 70 mmHg — ABNORMAL HIGH (ref 44–60)
pH, Ven: 7.45 — ABNORMAL HIGH (ref 7.25–7.43)
pO2, Ven: 46 mmHg — ABNORMAL HIGH (ref 32–45)

## 2022-01-11 LAB — BLOOD GAS, ARTERIAL
Acid-Base Excess: 17.1 mmol/L — ABNORMAL HIGH (ref 0.0–2.0)
Bicarbonate: 43.4 mmol/L — ABNORMAL HIGH (ref 20.0–28.0)
FIO2: 28 %
O2 Content: 2 L/min
O2 Saturation: 98.6 %
Patient temperature: 37.1
pCO2 arterial: 61 mmHg — ABNORMAL HIGH (ref 32–48)
pH, Arterial: 7.46 — ABNORMAL HIGH (ref 7.35–7.45)
pO2, Arterial: 87 mmHg (ref 83–108)

## 2022-01-11 LAB — GLUCOSE, CAPILLARY
Glucose-Capillary: 143 mg/dL — ABNORMAL HIGH (ref 70–99)
Glucose-Capillary: 174 mg/dL — ABNORMAL HIGH (ref 70–99)
Glucose-Capillary: 162 mg/dL — ABNORMAL HIGH (ref 70–99)
Glucose-Capillary: 191 mg/dL — ABNORMAL HIGH (ref 70–99)

## 2022-01-11 NOTE — Progress Notes (Signed)
Progress Note   Patient: Shannon [name] KZS:[id number] DOB: 12/05/1955 DOA: 12/17/2021     25 DOS: the patient was seen and examined on 01/11/2022   Brief hospital course: 66 year old female with a history of metastatic adenocarcinoma of lung, COPD, HIV, was admitted to the hospital with right lower extremity wound, necrotizing fascitis,seen by orthopedics and underwent incision and drainage.  He was noted to have fungemia.  Infectious disease following for antibiotics/antifungals. Overall, her condition worsened with worsening mental status and respiratory status.She required intubation and transfer to ICU.  She was treated for COPD exacerbation and since then, respiratory status has stabilized and she was extubated on 5/1.  Orthopedics following for gas gangrene/necrotizing fasciitis of her right lower extremity and plans are for amputation, underwent Rt AKA at [facility] 12/26/21. Post op with anemia needing prbc transfusions x2. PTOT working and advised CIR   Assessment and Plan: No notes have been filed under this hospital service. Service: Hospitalist Principal Problem:   Abscess of right foot, necrotizing fasciitis - Status post I&D on 12/20/2021 with OR culture revealing Klebsiella Proteus and Enterococcus - Status post right AKA on 5/5 - Completed Unasyn - changed Oxycodone to Morphine and stopped Neurontin on 5/13 secondary to confusion - Wound vac recently removed- Recently noted to be oozing blood with xarelto briefly held -Continued on anticoagulation without bleeding at this time   Active Problems:  Right forearm fascial biopsy and fasciotomy- right hand and arm edema - suspicion of infected arm-infection ruled out- biopsy revealed dense fibroadipose tissue - Dr. Wynelle Cleveland d/w Dr Caralyn Guile who recommends to continue dressings- iodoform gauze being used   New finding of subacute CVA -Due to confusion, MRI of the brain was obtained - Found to have a 1.6 cm evolving acute to early subacute  ischemic infarct in the right anterior temporal lobe without any associated hemorrhage or mass effect - neurology consulted, recommended MRI with contrast- unfortunately this has a great deal of motion artifact -Neurology has since recommended repeat MRI brain in 4 weeks w/ contrast to assess change  -Neuro recs to continue Biktarvy and monitor CD4 and avoid deliriogenic meds    Fungemia -Suspected to be as a result of the wound infection - Diflucan to be completed on 5/15 - ID has signed off - Ophthalmology eval completed on 5/9-no eye involvement noted    Acute respiratory failure with hypoxia and hypercapnia (HCC) COPD exacerbation Probable OSA - Failed BiPAP and required intubation -Is being treated with albuterol nebs as needed, Brovana twice daily, Pulmicort twice daily, prednisone taper, revefenacin neb daily - Continue incentive spirometry - Recent CXR reviewed, findings of unchanged L perihilar opacities with fiduciary markers, PICC in place -Serum bicarb has been steadily trending up over past few days. Staff reports that pt has not been compliant with nocturnal bipap -More confused and drowsy today. Reviewed VBG with pH 7.45 and CO2 70. Will confirm with ABG   Acute metabolic encephalopathy - RPR non reactive - Vit B12 normal - MRI brain suggestive of CVA as above - More confused. Will check ABG     HIV disease ([place]) - Continue Biktarvy - CD4 count on 11/25/2021 was 143 - repeat CD 4 count of 68   DM 2 - sugars worsened by Decadron - A1c is 7.3 as of 12/17/21 - on Levemir and Novolog     Uncontrolled hypertension   (HFpEF) heart failure with preserved ejection fraction (HCC) -Currently receiving carvedilol 12.5 mg twice daily, diltiazem 360 mg, hydralazine 25  mg 3 times daily, spironolactone 25 mg daily  -Current medications include carvedilol, diltiazem, spironolactone and hydralazine IV    Morbid (severe) obesity due to excess calories (HCC) Body mass index is  37.88 kg/m.     Atrial flutter (HCC) - Rate controlled on Cardizem and Coreg - on heparin currently, would transition to xarelto 5/19 if no further bleed   Acute blood loss anemia with macrocytosis - Status post 1 unit of packed red blood cell transfusion on 5/7 and a second unit on 5/28 - Hemoglobin has been stable since    Adenocarcinoma of lung [facility] recurrent metastatic non-small cell lung cancer - Status post curative radiotherapy to left upper lobe lung nodules-8/21 - sadly, she has had a recurrence as of Feb 2023> metastatic disease involving several areas of the bones including the ribs as well as thoracic spine and lumbar spine as well as sacrum.  She also has numerous small solid bilateral pulmonary nodules. - underwent CT-guided core biopsy of the metastatic lesion of the posterior right ninth rib and the final pathology was consistent with adenocarcinoma of the lung. - Follows with Dr. Earlie Server - she missed an appointment last week and I have requested for her to be evaluated in the hospital and receive another appointment - on Decadron per oncology (for bony mets)- on PPI for GI protection   Diarrhea - resolved- rectal tube  removed   Thrombocytopenia -  resolved       Subjective: Seems more confused  Physical Exam: Vitals:   01/11/22 0832 01/11/22 0854 01/11/22 1304 01/11/22 1634  BP: (!) 150/71  128/73   Pulse: 69  (!) 51 (!) 55  Resp:   18   Temp:   97.9 F (36.6 C)   TempSrc:   Oral   SpO2:  98% 93%   Weight:      Height:       General exam: Awake, laying in bed, in nad Respiratory system: Normal respiratory effort, no wheezing Cardiovascular system: regular rate, s1, s2 Gastrointestinal system: Soft, nondistended, positive BS Central nervous system: CN2-12 grossly intact, strength intact Extremities: Perfused, no clubbing Skin: Normal skin turgor, no notable skin lesions seen Psychiatry: Mood normal // no visual hallucinations   Data  Reviewed:  Labs reviewed: Na 139, Cr 1.03   Family Communication: Pt in room, family not at bedside  Disposition: Status is: Inpatient Remains inpatient appropriate because: Severity of illness  Planned Discharge Destination: Skilled nursing facility    Author: Marylu Lund, MD 01/11/2022 6:21 PM  For on call review [email or web address].

## 2022-01-11 NOTE — Progress Notes (Signed)
Discussed with physician patient's mental status today. This morning patient was able to tell RN that she was at [place] and now this afternoon she is sating that she is at [facility]. Patient is normally able to state RN's name but when asked patient continues to repeat Roseanne Kaufman, Roseanne Kaufman. Patient responds to voice and answers questions, however never opens her eyes and when asked if she is ok she states "no, I just don't feel good". MD ordered VBG which was drawn and sent to the lab. Will continue to monitor.

## 2022-01-11 NOTE — Progress Notes (Signed)
ABG done and walked down to the lab

## 2022-01-12 LAB — CBC
HCT: 31.9 % — ABNORMAL LOW (ref 36.0–46.0)
Hemoglobin: 10 g/dL — ABNORMAL LOW (ref 12.0–15.0)
MCH: 31.6 pg (ref 26.0–34.0)
MCHC: 31.3 g/dL (ref 30.0–36.0)
MCV: 100.9 fL — ABNORMAL HIGH (ref 80.0–100.0)
Platelets: 139 10*3/uL — ABNORMAL LOW (ref 150–400)
RBC: 3.16 MIL/uL — ABNORMAL LOW (ref 3.87–5.11)
RDW: 16.3 % — ABNORMAL HIGH (ref 11.5–15.5)
WBC: 10.7 10*3/uL — ABNORMAL HIGH (ref 4.0–10.5)
nRBC: 0.4 % — ABNORMAL HIGH (ref 0.0–0.2)

## 2022-01-12 LAB — COMPREHENSIVE METABOLIC PANEL
ALT: 31 U/L (ref 0–44)
AST: 19 U/L (ref 15–41)
Albumin: 2.7 g/dL — ABNORMAL LOW (ref 3.5–5.0)
Alkaline Phosphatase: 80 U/L (ref 38–126)
Anion gap: 6 (ref 5–15)
BUN: 55 mg/dL — ABNORMAL HIGH (ref 8–23)
CO2: 36 mmol/L — ABNORMAL HIGH (ref 22–32)
Calcium: 10.5 mg/dL — ABNORMAL HIGH (ref 8.9–10.3)
Chloride: 100 mmol/L (ref 98–111)
Creatinine, Ser: 1.17 mg/dL — ABNORMAL HIGH (ref 0.44–1.00)
GFR, Estimated: 51 mL/min — ABNORMAL LOW (ref >60)
Glucose, Bld: 152 mg/dL — ABNORMAL HIGH (ref 70–99)
Potassium: 4.7 mmol/L (ref 3.5–5.1)
Sodium: 142 mmol/L (ref 135–145)
Total Bilirubin: 0.7 mg/dL (ref 0.3–1.2)
Total Protein: 5.3 g/dL — ABNORMAL LOW (ref 6.5–8.1)

## 2022-01-12 LAB — GLUCOSE, CAPILLARY
Glucose-Capillary: 140 mg/dL — ABNORMAL HIGH (ref 70–99)
Glucose-Capillary: 175 mg/dL — ABNORMAL HIGH (ref 70–99)
Glucose-Capillary: 201 mg/dL — ABNORMAL HIGH (ref 70–99)
Glucose-Capillary: 174 mg/dL — ABNORMAL HIGH (ref 70–99)

## 2022-01-12 NOTE — Progress Notes (Signed)
Pt converted to a rhythm of Afib.No symptoms noted. Vitals taken. Low Bp and HR noted. Dr. Wyline Copas made aware. Hydralazine and coreg held per Dr. Wyline Copas. This Probation officer continued to monitor pt.

## 2022-01-12 NOTE — Progress Notes (Signed)
Dsg changes performed  to LLE and RUE wounds. Generalized opened blister sites cleansed and mepilex drsgs applied.pt tolerated with no problems.

## 2022-01-12 NOTE — TOC Progression Note (Signed)
Transition of Care [facility]) - Progression Note    Patient Details  Name: Shannon [name] MRN: [id number] Date of Birth: [birth year]  Transition of Care [facility]) CM/SW Contact  Dulcemaria Bula, Marjie Skiff, RN Phone Number: 01/12/2022, 11:02 AM  Clinical Narrative:    Insurance auth approved 5/20-5/24 [id number]. MD made aware. SNF bed at [facility] still available.   Expected Discharge Plan: [place] Barriers to Discharge: Continued Medical Work up, SNF Pending bed offer  Expected Discharge Plan and Services Expected Discharge Plan: [place]   Discharge Planning Services: CM Consult Post Acute Care Choice: [place] Living arrangements for the past 2 months: Apartment                     Readmission Risk Interventions    12/22/2021    9:44 AM  Readmission Risk Prevention Plan  Transportation Screening Complete  Medication Review (RN Care Manager) Complete  HRI or Home Care Consult Complete  SW Recovery Care/Counseling Consult Complete  Palliative Care Screening Not Applicable  Skilled Nursing Facility Complete

## 2022-01-12 NOTE — Plan of Care (Signed)
?  Problem: Clinical Measurements: ?Goal: Will remain free from infection ?Outcome: Progressing ?  ?

## 2022-01-12 NOTE — Progress Notes (Signed)
Progress Note   Patient: Shannon [name] ELF:[id number] DOB: [birth year] DOA: 12/17/2021     26 DOS: the patient was seen and examined on 01/12/2022   Brief hospital course: 66 year old female with a history of metastatic adenocarcinoma of lung, COPD, HIV, was admitted to the hospital with right lower extremity wound, necrotizing fascitis,seen by orthopedics and underwent incision and drainage.  He was noted to have fungemia.  Infectious disease following for antibiotics/antifungals. Overall, her condition worsened with worsening mental status and respiratory status.She required intubation and transfer to ICU.  She was treated for COPD exacerbation and since then, respiratory status has stabilized and she was extubated on 5/1.  Orthopedics following for gas gangrene/necrotizing fasciitis of her right lower extremity and plans are for amputation, underwent Rt AKA at [facility] 12/26/21. Post op with anemia needing prbc transfusions x2. PTOT working and advised CIR   Assessment and Plan: No notes have been filed under this hospital service. Service: Hospitalist Principal Problem:   Abscess of right foot, necrotizing fasciitis - Status post I&D on 12/20/2021 with OR culture revealing Klebsiella Proteus and Enterococcus - Status post right AKA on 5/5 - Completed course of Unasyn - changed Oxycodone to Morphine and stopped Neurontin on 5/13 secondary to confusion - Wound vac recently removed- Recently noted to be oozing blood with xarelto briefly held -Continued on anticoagulation without bleeding at this time   Active Problems:  Right forearm fascial biopsy and fasciotomy- right hand and arm edema - suspicion of infected arm-infection ruled out- biopsy revealed dense fibroadipose tissue - Dr. Wynelle Cleveland d/w Dr Caralyn Guile who recommends to continue dressings- iodoform gauze being used   New finding of subacute CVA -Due to confusion, MRI of the brain was obtained - Found to have a 1.6 cm evolving acute to early  subacute ischemic infarct in the right anterior temporal lobe without any associated hemorrhage or mass effect - neurology consulted, recommended MRI with contrast- unfortunately this has a great deal of motion artifact -Neurology has since recommended repeat MRI brain in 4 weeks w/ contrast to assess change  -Neuro recs to continue Biktarvy and monitor CD4 and avoid deliriogenic meds    Fungemia -Suspected to be as a result of the wound infection - Diflucan completed on 5/15 - ID has signed off - Ophthalmology eval completed on 5/9-no eye involvement noted    Acute respiratory failure with hypoxia and hypercapnia (HCC) COPD exacerbation Probable OSA - Failed BiPAP and required intubation -Is being treated with albuterol nebs as needed, Brovana twice daily, Pulmicort twice daily, prednisone taper, revefenacin neb daily - Continue incentive spirometry - Recent CXR reviewed, findings of unchanged L perihilar opacities with fiduciary markers, PICC in place -Serum bicarb has been steadily trending up over past few days. Staff reports that pt has not been compliant with nocturnal bipap -Remains more confused. Recent ABG pH 7.46, pCO2 61   Acute metabolic encephalopathy - RPR non reactive - Vit B12 normal - MRI brain suggestive of CVA as above - More confused lately, suspect secondary to noncompliance with cpap.      HIV disease ([place]) - Continue Biktarvy - CD4 count on 11/25/2021 was 143 - repeat CD 4 count of 68   DM 2 - sugars worsened by Decadron - A1c is 7.3 as of 12/17/21 - on Levemir and Novolog     Uncontrolled hypertension   (HFpEF) heart failure with preserved ejection fraction (HCC) -Currently receiving carvedilol 12.5 mg twice daily, diltiazem 360 mg, hydralazine 25 mg 3 times  daily, spironolactone 25 mg daily  -Current medications include carvedilol, diltiazem, spironolactone and hydralazine IV    Morbid (severe) obesity due to excess calories (HCC) Body mass index is  37.88 kg/m.     Atrial flutter (HCC) - Rate controlled on Cardizem and Coreg - now on xarelto, tolerating   Acute blood loss anemia with macrocytosis - Status post 1 unit of packed red blood cell transfusion on 5/7 and a second unit on 5/28 - Hemoglobin has been stable since    Adenocarcinoma of lung [facility]), recurrent metastatic non-small cell lung cancer - Status post curative radiotherapy to left upper lobe lung nodules-8/21 - sadly, she has had a recurrence as of Feb 2023> metastatic disease involving several areas of the bones including the ribs as well as thoracic spine and lumbar spine as well as sacrum.  She also has numerous small solid bilateral pulmonary nodules. - underwent CT-guided core biopsy of the metastatic lesion of the posterior right ninth rib and the final pathology was consistent with adenocarcinoma of the lung. - Follows with Dr. Earlie Server - she missed an appointment last week and I have requested for her to be evaluated in the hospital and receive another appointment - on Decadron per oncology (for bony mets)- on PPI for GI protection   Diarrhea - resolved- rectal tube  removed   Thrombocytopenia -  resolved       Subjective: Confused this AM  Physical Exam: Vitals:   01/11/22 2051 01/11/22 2155 01/12/22 0530 01/12/22 0828  BP: (!) 141/63  (!) 118/57   Pulse: (!) 53 68 (!) 53   Resp: 18 20 16    Temp: 98.6 F (37 C)  98.4 F (36.9 C)   TempSrc: Oral  Oral   SpO2: 93%  97% 98%  Weight:      Height:       General exam: Conversant, in no acute distress Respiratory system: normal chest rise, clear, no audible wheezing Cardiovascular system: regular rhythm, s1-s2 Gastrointestinal system: Nondistended, nontender, pos BS Central nervous system: No seizures, no tremors Extremities: No cyanosis, no joint deformities Skin: No rashes, no pallor Psychiatry: Affect normal // no auditory hallucinations   Data Reviewed:  Labs reviewed: Na 142, Cr 1.17    Family Communication: Pt in room, family not at bedside  Disposition: Status is: Inpatient Remains inpatient appropriate because: Severity of illness  Planned Discharge Destination: Skilled nursing facility    Author: Marylu Lund, MD 01/12/2022 4:39 PM  For on call review [email or web address].

## 2022-01-13 LAB — CBC
HCT: 30.1 % — ABNORMAL LOW (ref 36.0–46.0)
Hemoglobin: 9.4 g/dL — ABNORMAL LOW (ref 12.0–15.0)
MCH: 31.6 pg (ref 26.0–34.0)
MCHC: 31.2 g/dL (ref 30.0–36.0)
MCV: 101.3 fL — ABNORMAL HIGH (ref 80.0–100.0)
Platelets: 125 10*3/uL — ABNORMAL LOW (ref 150–400)
RBC: 2.97 MIL/uL — ABNORMAL LOW (ref 3.87–5.11)
RDW: 16.4 % — ABNORMAL HIGH (ref 11.5–15.5)
WBC: 9.3 10*3/uL (ref 4.0–10.5)
nRBC: 0.2 % (ref 0.0–0.2)

## 2022-01-13 LAB — GLUCOSE, CAPILLARY
Glucose-Capillary: 143 mg/dL — ABNORMAL HIGH (ref 70–99)
Glucose-Capillary: 186 mg/dL — ABNORMAL HIGH (ref 70–99)
Glucose-Capillary: 153 mg/dL — ABNORMAL HIGH (ref 70–99)
Glucose-Capillary: 185 mg/dL — ABNORMAL HIGH (ref 70–99)

## 2022-01-13 LAB — COMPREHENSIVE METABOLIC PANEL
ALT: 38 U/L (ref 0–44)
AST: 17 U/L (ref 15–41)
Albumin: 2.7 g/dL — ABNORMAL LOW (ref 3.5–5.0)
Alkaline Phosphatase: 79 U/L (ref 38–126)
Anion gap: 7 (ref 5–15)
BUN: 59 mg/dL — ABNORMAL HIGH (ref 8–23)
CO2: 36 mmol/L — ABNORMAL HIGH (ref 22–32)
Calcium: 10.3 mg/dL (ref 8.9–10.3)
Chloride: 99 mmol/L (ref 98–111)
Creatinine, Ser: 1.06 mg/dL — ABNORMAL HIGH (ref 0.44–1.00)
GFR, Estimated: 58 mL/min — ABNORMAL LOW (ref >60)
Glucose, Bld: 164 mg/dL — ABNORMAL HIGH (ref 70–99)
Potassium: 4.5 mmol/L (ref 3.5–5.1)
Sodium: 142 mmol/L (ref 135–145)
Total Bilirubin: 0.9 mg/dL (ref 0.3–1.2)
Total Protein: 5.2 g/dL — ABNORMAL LOW (ref 6.5–8.1)

## 2022-01-13 NOTE — Progress Notes (Addendum)
Physical Therapy Treatment Patient Details Name: Shannon [name] MRN: [id number] DOB: 20-Aug-1956 Today's Date: 01/13/2022   History of Present Illness Shannon [name] is a 66 y.o. woman with metastatic non-small cell lung cancer, asthma/COPD, GERD, HTN, and HIV who was admitted 12/17/21  due to right lower extremity wound  S/P irrigation and debridement of right foot and calf wounds on 12/17/21 .  On 4/28 she developed swelling of her right upper extremity and went for incision and drainage morning of 4/29 without overt signs of necrotizing fasciitis. She developed progressive mental status changes 4/28 to 4/29 with obtundation. She was transferred to the ICU and PCCM consulted for hypercapnic respiratory failure.  Intubated 4/29 and extubated 12/22/21. Pt underwent Rt AKA on 12/26/21.    PT Comments    Today's PT session focused on sit to stand transfers with use of MAX A+2 and stedy. Pt continues to require +2 assist for all mobility. Able to follow commands requires some redirection and increased time for proper technique/initiation due to cognitive status. Pt will benefit from continued skilled PT to increase their independence and maximize safety with mobility.     Recommendations for follow up therapy are one component of a multi-disciplinary discharge planning process, led by the attending physician.  Recommendations may be updated based on patient status, additional functional criteria and insurance authorization.  Follow Up Recommendations  Skilled nursing-short term rehab (<3 hours/day)     Assistance Recommended at Discharge Frequent or constant Supervision/Assistance  Patient can return home with the following Two people to help with walking and/or transfers;Assistance with cooking/housework;Assist for transportation;Help with stairs or ramp for entrance;Two people to help with bathing/dressing/bathroom;Direct supervision/assist for medications management   Equipment Recommendations   Wheelchair cushion (measurements PT);Wheelchair (measurements PT)    Recommendations for Other Services       Precautions / Restrictions Precautions Precautions: Fall Precaution Comments: R AKA, moves quick - be close Restrictions Weight Bearing Restrictions: Yes RLE Weight Bearing: Non weight bearing Other Position/Activity Restrictions: New R AKA     Mobility  Bed Mobility Overal bed mobility: Needs Assistance Bed Mobility: Supine to Sit, Sit to Supine, Rolling Rolling: Supervision   Supine to sit: Mod assist, +2 for physical assistance, +2 for safety/equipment, HOB elevated Sit to supine: Mod assist, +2 for physical assistance, +2 for safety/equipment, HOB elevated   General bed mobility comments: use of bed rails for rolling for assist with pericare and chuckpad changes as pt soiled. Increased assist for bed mobility today, pt with inconsistent command following when fatigued and wanting to lie back down. Pt positioned into L semi sidelying at end of session.    Transfers Overall transfer level: Needs assistance Equipment used:  (stedy) Transfers: Sit to/from Stand Sit to Stand: Max assist, +2 physical assistance, +2 safety/equipment           General transfer comment: STS x3 from EOB. MAX A+2, able to clear hips from EOB more each time but unable to achieve full stand despite +2 assistance. Pt with some difficulty using UEs to assist, improved with each stand. Pt deferring anterio-posterior transfer to chair, requesting to lie down-fatigued. Transfer via Lift Equipment: Stedy  Ambulation/Gait                   Stairs             Wheelchair Mobility    Modified Rankin (Stroke Patients Only)       Balance Overall balance assessment: Needs assistance Sitting-balance support: Bilateral  upper extremity supported, Feet supported Sitting balance-Leahy Scale: Poor Sitting balance - Comments: MIN guard and at least single UE support required to  maintain balance   Standing balance support: Bilateral upper extremity supported, During functional activity, Reliant on assistive device for balance Standing balance-Leahy Scale: Zero                              Cognition Arousal/Alertness: Awake/alert Behavior During Therapy: WFL for tasks assessed/performed Overall Cognitive Status: Impaired/Different from baseline Area of Impairment: Memory, Safety/judgement, Following commands, Awareness, Problem solving, Attention                 Orientation Level: Disoriented to, Time, Situation Current Attention Level: Sustained Memory: Decreased short-term memory, Decreased recall of precautions Following Commands: Follows one step commands with increased time Safety/Judgement: Decreased awareness of safety, Decreased awareness of deficits   Problem Solving: Requires verbal cues, Requires tactile cues, Difficulty sequencing, Decreased initiation, Slow processing General Comments: able to follow commands with increased time, intermittent repetition. Able to remember therapist's name addressing by first name throughout session.        Exercises      General Comments        Pertinent Vitals/Pain Pain Assessment Pain Assessment: Faces Faces Pain Scale: Hurts a little bit Pain Location: peri region Pain Descriptors / Indicators: Grimacing, Sharp Pain Intervention(s): Monitored during session, Repositioned    Home Living                          Prior Function            PT Goals (current goals can now be found in the care plan section) Acute Rehab PT Goals Patient Stated Goal: get better PT Goal Formulation: With patient Time For Goal Achievement: 01/13/22 Potential to Achieve Goals: Fair Progress towards PT goals: Progressing toward goals    Frequency    Min 2X/week      PT Plan Current plan remains appropriate    Co-evaluation              AM-PAC PT "6 Clicks" Mobility    Outcome Measure  Help needed turning from your back to your side while in a flat bed without using bedrails?: A Little Help needed moving from lying on your back to sitting on the side of a flat bed without using bedrails?: A Lot Help needed moving to and from a bed to a chair (including a wheelchair)?: A Lot Help needed standing up from a chair using your arms (e.g., wheelchair or bedside chair)?: Total Help needed to walk in hospital room?: Total Help needed climbing 3-5 steps with a railing? : Total 6 Click Score: 10    End of Session Equipment Utilized During Treatment: Gait belt;Oxygen Activity Tolerance: Patient tolerated treatment well Patient left: in bed;with call bell/phone within reach;with bed alarm set Nurse Communication: Mobility status PT Visit Diagnosis: Unsteadiness on feet (R26.81);Other abnormalities of gait and mobility (R26.89);Muscle weakness (generalized) (M62.81)     Time: 9767-3419 PT Time Calculation (min) (ACUTE ONLY): 41 min  Charges:  $Therapeutic Activity: 38-52 mins                     Festus Barren PT, DPT  Acute Rehabilitation Services  Office [phone]  01/13/2022, 3:39 PM

## 2022-01-13 NOTE — Progress Notes (Signed)
24 hour chart audit completed 

## 2022-01-13 NOTE — Progress Notes (Signed)
Pt refused BiPAP qhs and 20:00 breathing treatments.  Pt encouraged to have her RN contact RT should she change her mind.

## 2022-01-13 NOTE — Progress Notes (Addendum)
Progress Note   Patient: Shannon [name]:[id number] DOB: Sep 11, 1955 DOA: 12/17/2021     27 DOS: the patient was seen and examined on 01/13/2022   Brief hospital course: 67 year old female with a history of metastatic adenocarcinoma of lung, COPD, HIV, was admitted to the hospital with right lower extremity wound, necrotizing fascitis,seen by orthopedics and underwent incision and drainage.  He was noted to have fungemia.  Infectious disease following for antibiotics/antifungals. Overall, her condition worsened with worsening mental status and respiratory status.She required intubation and transfer to ICU.  She was treated for COPD exacerbation and since then, respiratory status has stabilized and she was extubated on 5/1.  Orthopedics following for gas gangrene/necrotizing fasciitis of her right lower extremity and plans are for amputation, underwent Rt AKA at [facility] 12/26/21. Post op with anemia needing prbc transfusions x2. PTOT working and advised CIR   Assessment and Plan: No notes have been filed under this hospital service. Service: Hospitalist Principal Problem:   Abscess of right foot, necrotizing fasciitis - Status post I&D on 12/20/2021 with OR culture revealing Klebsiella Proteus and Enterococcus - Status post right AKA on 5/5 - Completed course of Unasyn - changed Oxycodone to Morphine and stopped Neurontin on 5/13 secondary to confusion - Wound vac recently removed- Recently noted to be oozing blood with xarelto briefly held -Continued on anticoagulation without bleeding at this time   Active Problems:  Right forearm fascial biopsy and fasciotomy- right hand and arm edema - suspicion of infected arm-infection ruled out- biopsy revealed dense fibroadipose tissue - Dr. Wynelle Cleveland d/w Dr Caralyn Guile who recommends to continue dressings- iodoform gauze being used   New finding of subacute CVA -Due to confusion, MRI of the brain was obtained - Found to have a 1.6 cm evolving acute to early  subacute ischemic infarct in the right anterior temporal lobe without any associated hemorrhage or mass effect - neurology consulted, recommended MRI with contrast- unfortunately this has a great deal of motion artifact -Neurology has since recommended repeat MRI brain in 4 weeks w/ contrast to assess change  -Neuro recs to continue Biktarvy and monitor CD4 and avoid deliriogenic meds    Fungemia -Suspected to be as a result of the wound infection - Diflucan completed on 5/15 - ID has signed off - Ophthalmology eval completed on 5/9-no eye involvement noted    Acute respiratory failure with hypoxia and hypercapnia (HCC) COPD exacerbation Probable OSA - Initially failed BiPAP and required intubation -Is being treated with albuterol nebs as needed, Brovana twice daily, Pulmicort twice daily, prednisone taper, revefenacin neb daily - Continue incentive spirometry - Recent CXR reviewed, findings of unchanged L perihilar opacities with fiduciary markers, PICC in place -Serum bicarb has been steadily trending up over past few days. Pt has not been compliant with bipap -Remains more confused. UPDATE: Staff has been making sure pt wears cpap during day if pt falls asleep. Pt continues to try to take it off whenever possible. Staff reports confusion is now slowly improving    Acute metabolic encephalopathy - RPR non reactive - Vit B12 normal - MRI brain on 5/13 suggestive of CVA as above, motion degradation noted. Neurology recs to repeat MRI in 4 weeks - More confused lately, suspect secondary to noncompliance with cpap.  - Will check head CT w/o contrast     HIV disease ([place]) - Continue Biktarvy - CD4 count on 11/25/2021 was 143 - repeat CD 4 count of 68   DM 2 - sugars worsened by  Decadron - A1c is 7.3 as of 12/17/21 - on Levemir and Novolog - glycemic trends appear stable     Uncontrolled hypertension   (HFpEF) heart failure with preserved ejection fraction (HCC) -Currently  receiving carvedilol 12.5 mg twice daily, diltiazem 360 mg, hydralazine 25 mg 3 times daily, spironolactone 25 mg daily  -Current medications include carvedilol, diltiazem, spironolactone and hydralazine IV    Morbid (severe) obesity due to excess calories (HCC) Body mass index is 37.88 kg/m. -Recommend diet/lifestyle modification     Atrial flutter (HCC) - Rate controlled on Cardizem and Coreg - Tolerating resumption of xarelto   Acute blood loss anemia with macrocytosis - Status post 1 unit of packed red blood cell transfusion on 5/7 and a second unit on 5/28 - Hemoglobin has been stable since -Recheck cbc in AM    Adenocarcinoma of lung [facility]), recurrent metastatic non-small cell lung cancer - Status post curative radiotherapy to left upper lobe lung nodules-8/21 - Pt noted to have a recurrence as of Feb 2023> metastatic disease involving several areas of the bones including the ribs as well as thoracic spine and lumbar spine as well as sacrum.  She also has numerous small solid bilateral pulmonary nodules. - underwent CT-guided core biopsy of the metastatic lesion of the posterior right ninth rib and the final pathology was consistent with adenocarcinoma of the lung. - Follows with Dr. Earlie Server - on Decadron per oncology (for bony mets)- on PPI for GI protection   Diarrhea - resolved- rectal tube  removed   Thrombocytopenia -  resolved       Subjective: Remains confused this AM. Staff reports pt is not being compliant with cpap  Physical Exam: Vitals:   01/13/22 0608 01/13/22 0752 01/13/22 0816 01/13/22 1621  BP: (!) 142/66 (!) 157/71  117/68  Pulse: (!) 54 96  82  Resp: 16 16  20   Temp: 98.2 F (36.8 C) 98.1 F (36.7 C)    TempSrc: Oral Oral    SpO2: 100% 95% 95% 99%  Weight:      Height:       General exam: Awake, laying in bed, in nad Respiratory system: Normal respiratory effort, no wheezing Cardiovascular system: regular rate, s1, s2 Gastrointestinal  system: Soft, nondistended, positive BS Central nervous system: CN2-12 grossly intact, strength intact Extremities: s/p RLE AKA, no evidence of bleeding through dressing Skin: Normal skin turgor, no notable skin lesions seen Psychiatry: seems confused  Data Reviewed:  Labs reviewed: Hgb 9.4, Plt 125   Family Communication: Pt in room, family not at bedside  Disposition: Status is: Inpatient Remains inpatient appropriate because: Severity of illness  Planned Discharge Destination: Skilled nursing facility    Author: Marylu Lund, MD 01/13/2022 4:27 PM  For on call review [email or web address].

## 2022-01-14 DIAGNOSIS — L03113 Cellulitis of right upper limb: Secondary | ICD-10-CM | POA: Diagnosis not present

## 2022-01-14 DIAGNOSIS — B20 Human immunodeficiency virus [HIV] disease: Secondary | ICD-10-CM | POA: Diagnosis present

## 2022-01-14 DIAGNOSIS — I5033 Acute on chronic diastolic (congestive) heart failure: Secondary | ICD-10-CM | POA: Diagnosis not present

## 2022-01-14 DIAGNOSIS — T8142XA Infection following a procedure, deep incisional surgical site, initial encounter: Secondary | ICD-10-CM | POA: Diagnosis not present

## 2022-01-14 DIAGNOSIS — E118 Type 2 diabetes mellitus with unspecified complications: Secondary | ICD-10-CM | POA: Diagnosis not present

## 2022-01-14 DIAGNOSIS — M7989 Other specified soft tissue disorders: Secondary | ICD-10-CM | POA: Diagnosis not present

## 2022-01-14 DIAGNOSIS — L539 Erythematous condition, unspecified: Secondary | ICD-10-CM | POA: Diagnosis not present

## 2022-01-14 DIAGNOSIS — R58 Hemorrhage, not elsewhere classified: Secondary | ICD-10-CM | POA: Diagnosis not present

## 2022-01-14 DIAGNOSIS — Z89511 Acquired absence of right leg below knee: Secondary | ICD-10-CM | POA: Diagnosis not present

## 2022-01-14 DIAGNOSIS — F05 Delirium due to known physiological condition: Secondary | ICD-10-CM | POA: Diagnosis present

## 2022-01-14 DIAGNOSIS — D696 Thrombocytopenia, unspecified: Secondary | ICD-10-CM | POA: Diagnosis not present

## 2022-01-14 DIAGNOSIS — I130 Hypertensive heart and chronic kidney disease with heart failure and stage 1 through stage 4 chronic kidney disease, or unspecified chronic kidney disease: Secondary | ICD-10-CM | POA: Diagnosis not present

## 2022-01-14 DIAGNOSIS — N183 Chronic kidney disease, stage 3 unspecified: Secondary | ICD-10-CM | POA: Diagnosis not present

## 2022-01-14 DIAGNOSIS — M6281 Muscle weakness (generalized): Secondary | ICD-10-CM | POA: Diagnosis not present

## 2022-01-14 DIAGNOSIS — Z743 Need for continuous supervision: Secondary | ICD-10-CM | POA: Diagnosis not present

## 2022-01-14 DIAGNOSIS — G9341 Metabolic encephalopathy: Secondary | ICD-10-CM | POA: Diagnosis not present

## 2022-01-14 DIAGNOSIS — D539 Nutritional anemia, unspecified: Secondary | ICD-10-CM | POA: Diagnosis not present

## 2022-01-14 DIAGNOSIS — Z4781 Encounter for orthopedic aftercare following surgical amputation: Secondary | ICD-10-CM | POA: Diagnosis not present

## 2022-01-14 DIAGNOSIS — A419 Sepsis, unspecified organism: Secondary | ICD-10-CM | POA: Diagnosis not present

## 2022-01-14 DIAGNOSIS — R9431 Abnormal electrocardiogram [ECG] [EKG]: Secondary | ICD-10-CM | POA: Diagnosis not present

## 2022-01-14 DIAGNOSIS — I483 Typical atrial flutter: Secondary | ICD-10-CM | POA: Diagnosis not present

## 2022-01-14 DIAGNOSIS — J9602 Acute respiratory failure with hypercapnia: Secondary | ICD-10-CM | POA: Diagnosis not present

## 2022-01-14 DIAGNOSIS — B49 Unspecified mycosis: Secondary | ICD-10-CM | POA: Diagnosis not present

## 2022-01-14 DIAGNOSIS — Z1152 Encounter for screening for COVID-19: Secondary | ICD-10-CM | POA: Diagnosis not present

## 2022-01-14 DIAGNOSIS — E785 Hyperlipidemia, unspecified: Secondary | ICD-10-CM | POA: Diagnosis not present

## 2022-01-14 DIAGNOSIS — J9611 Chronic respiratory failure with hypoxia: Secondary | ICD-10-CM | POA: Diagnosis not present

## 2022-01-14 DIAGNOSIS — T8149XA Infection following a procedure, other surgical site, initial encounter: Secondary | ICD-10-CM | POA: Diagnosis not present

## 2022-01-14 DIAGNOSIS — Z1159 Encounter for screening for other viral diseases: Secondary | ICD-10-CM | POA: Diagnosis not present

## 2022-01-14 DIAGNOSIS — I4821 Permanent atrial fibrillation: Secondary | ICD-10-CM | POA: Diagnosis not present

## 2022-01-14 DIAGNOSIS — Z7401 Bed confinement status: Secondary | ICD-10-CM | POA: Diagnosis not present

## 2022-01-14 DIAGNOSIS — E1122 Type 2 diabetes mellitus with diabetic chronic kidney disease: Secondary | ICD-10-CM | POA: Diagnosis not present

## 2022-01-14 DIAGNOSIS — R600 Localized edema: Secondary | ICD-10-CM | POA: Diagnosis not present

## 2022-01-14 DIAGNOSIS — Z48817 Encounter for surgical aftercare following surgery on the skin and subcutaneous tissue: Secondary | ICD-10-CM | POA: Diagnosis not present

## 2022-01-14 DIAGNOSIS — E46 Unspecified protein-calorie malnutrition: Secondary | ICD-10-CM | POA: Diagnosis not present

## 2022-01-14 DIAGNOSIS — C7951 Secondary malignant neoplasm of bone: Secondary | ICD-10-CM | POA: Diagnosis not present

## 2022-01-14 DIAGNOSIS — L03115 Cellulitis of right lower limb: Secondary | ICD-10-CM | POA: Diagnosis not present

## 2022-01-14 DIAGNOSIS — Z89611 Acquired absence of right leg above knee: Secondary | ICD-10-CM | POA: Diagnosis not present

## 2022-01-14 DIAGNOSIS — I4892 Unspecified atrial flutter: Secondary | ICD-10-CM | POA: Diagnosis not present

## 2022-01-14 DIAGNOSIS — Z6834 Body mass index (BMI) 34.0-34.9, adult: Secondary | ICD-10-CM | POA: Diagnosis not present

## 2022-01-14 DIAGNOSIS — E559 Vitamin D deficiency, unspecified: Secondary | ICD-10-CM | POA: Diagnosis not present

## 2022-01-14 DIAGNOSIS — K219 Gastro-esophageal reflux disease without esophagitis: Secondary | ICD-10-CM | POA: Diagnosis not present

## 2022-01-14 DIAGNOSIS — G4733 Obstructive sleep apnea (adult) (pediatric): Secondary | ICD-10-CM | POA: Diagnosis not present

## 2022-01-14 DIAGNOSIS — I5032 Chronic diastolic (congestive) heart failure: Secondary | ICD-10-CM | POA: Diagnosis not present

## 2022-01-14 DIAGNOSIS — C3492 Malignant neoplasm of unspecified part of left bronchus or lung: Secondary | ICD-10-CM | POA: Diagnosis not present

## 2022-01-14 DIAGNOSIS — R609 Edema, unspecified: Secondary | ICD-10-CM | POA: Diagnosis not present

## 2022-01-14 DIAGNOSIS — T8743 Infection of amputation stump, right lower extremity: Secondary | ICD-10-CM | POA: Diagnosis not present

## 2022-01-14 DIAGNOSIS — Z794 Long term (current) use of insulin: Secondary | ICD-10-CM | POA: Diagnosis not present

## 2022-01-14 DIAGNOSIS — Z4889 Encounter for other specified surgical aftercare: Secondary | ICD-10-CM | POA: Diagnosis not present

## 2022-01-14 DIAGNOSIS — Y835 Amputation of limb(s) as the cause of abnormal reaction of the patient, or of later complication, without mention of misadventure at the time of the procedure: Secondary | ICD-10-CM | POA: Diagnosis not present

## 2022-01-14 DIAGNOSIS — M726 Necrotizing fasciitis: Secondary | ICD-10-CM | POA: Diagnosis not present

## 2022-01-14 DIAGNOSIS — I10 Essential (primary) hypertension: Secondary | ICD-10-CM | POA: Diagnosis not present

## 2022-01-14 DIAGNOSIS — I2510 Atherosclerotic heart disease of native coronary artery without angina pectoris: Secondary | ICD-10-CM | POA: Diagnosis not present

## 2022-01-14 DIAGNOSIS — M810 Age-related osteoporosis without current pathological fracture: Secondary | ICD-10-CM | POA: Diagnosis not present

## 2022-01-14 DIAGNOSIS — C3490 Malignant neoplasm of unspecified part of unspecified bronchus or lung: Secondary | ICD-10-CM | POA: Diagnosis not present

## 2022-01-14 DIAGNOSIS — I639 Cerebral infarction, unspecified: Secondary | ICD-10-CM | POA: Diagnosis not present

## 2022-01-14 DIAGNOSIS — S5011XA Contusion of right forearm, initial encounter: Secondary | ICD-10-CM | POA: Diagnosis not present

## 2022-01-14 DIAGNOSIS — Z8739 Personal history of other diseases of the musculoskeletal system and connective tissue: Secondary | ICD-10-CM | POA: Diagnosis not present

## 2022-01-14 DIAGNOSIS — R410 Disorientation, unspecified: Secondary | ICD-10-CM | POA: Diagnosis not present

## 2022-01-14 DIAGNOSIS — L7632 Postprocedural hematoma of skin and subcutaneous tissue following other procedure: Secondary | ICD-10-CM | POA: Diagnosis not present

## 2022-01-14 DIAGNOSIS — J449 Chronic obstructive pulmonary disease, unspecified: Secondary | ICD-10-CM | POA: Diagnosis not present

## 2022-01-14 DIAGNOSIS — S51801A Unspecified open wound of right forearm, initial encounter: Secondary | ICD-10-CM | POA: Diagnosis not present

## 2022-01-14 DIAGNOSIS — I471 Supraventricular tachycardia: Secondary | ICD-10-CM | POA: Diagnosis not present

## 2022-01-14 DIAGNOSIS — J441 Chronic obstructive pulmonary disease with (acute) exacerbation: Secondary | ICD-10-CM | POA: Diagnosis not present

## 2022-01-14 LAB — GLUCOSE, CAPILLARY
Glucose-Capillary: 121 mg/dL — ABNORMAL HIGH (ref 70–99)
Glucose-Capillary: 206 mg/dL — ABNORMAL HIGH (ref 70–99)

## 2022-01-14 LAB — BASIC METABOLIC PANEL
Anion gap: 7 (ref 5–15)
BUN: 52 mg/dL — ABNORMAL HIGH (ref 8–23)
CO2: 35 mmol/L — ABNORMAL HIGH (ref 22–32)
Calcium: 10.2 mg/dL (ref 8.9–10.3)
Chloride: 102 mmol/L (ref 98–111)
Creatinine, Ser: 1.1 mg/dL — ABNORMAL HIGH (ref 0.44–1.00)
GFR, Estimated: 55 mL/min — ABNORMAL LOW (ref >60)
Glucose, Bld: 171 mg/dL — ABNORMAL HIGH (ref 70–99)
Potassium: 4.6 mmol/L (ref 3.5–5.1)
Sodium: 144 mmol/L (ref 135–145)

## 2022-01-14 LAB — MAGNESIUM: Magnesium: 1.9 mg/dL (ref 1.7–2.4)

## 2022-01-14 MED ORDER — OXYCODONE-ACETAMINOPHEN 5-325 MG PO TABS: 1.0000 | ORAL_TABLET | ORAL | 0 refills | Status: DC | PRN

## 2022-01-14 MED ORDER — MOMETASONE FURO-FORMOTEROL FUM 100-5 MCG/ACT IN AERO: 2.0000 | INHALATION_SPRAY | Freq: Two times a day (BID) | RESPIRATORY_TRACT | Status: AC

## 2022-01-14 MED ORDER — MORPHINE SULFATE ER 15 MG PO TBCR: 15.0000 mg | EXTENDED_RELEASE_TABLET | Freq: Two times a day (BID) | ORAL | 0 refills | Status: DC

## 2022-01-14 MED ORDER — POLYETHYLENE GLYCOL 3350 17 G PO PACK: 17.0000 g | PACK | Freq: Every day | ORAL | 0 refills | Status: AC | PRN

## 2022-01-14 MED ORDER — BIKTARVY 50-200-25 MG PO TABS: 1.0000 | ORAL_TABLET | Freq: Every day | ORAL | 5 refills | Status: AC

## 2022-01-14 MED ORDER — INSULIN DETEMIR 100 UNIT/ML ~~LOC~~ SOLN: 8.0000 [IU] | Freq: Every day | SUBCUTANEOUS | 11 refills | Status: DC

## 2022-01-14 MED ORDER — CARVEDILOL 12.5 MG PO TABS: 12.5000 mg | ORAL_TABLET | Freq: Two times a day (BID) | ORAL | Status: AC

## 2022-01-14 MED ORDER — PANTOPRAZOLE SODIUM 40 MG PO TBEC: 40.0000 mg | DELAYED_RELEASE_TABLET | Freq: Every day | ORAL | Status: AC

## 2022-01-14 MED ORDER — DOCUSATE SODIUM 100 MG PO CAPS: 100.0000 mg | ORAL_CAPSULE | Freq: Every day | ORAL | 0 refills | Status: AC

## 2022-01-14 MED ORDER — HYDRALAZINE HCL 25 MG PO TABS
25.0000 mg | ORAL_TABLET | Freq: Three times a day (TID) | ORAL | Status: AC
Start: 2022-01-14 — End: ?

## 2022-01-14 MED ORDER — ENSURE ENLIVE PO LIQD: 237.0000 mL | Freq: Two times a day (BID) | ORAL | 12 refills | Status: AC

## 2022-01-14 NOTE — TOC Transition Note (Signed)
Transition of Care [facility]) - CM/SW Discharge Note   Patient Details  Name: Shannon [name] MRN: [id number] Date of Birth: Sep 25, 1955  [facility] of [facility] CM/SW Contact:  Lynnell Catalan, RN Phone Number: 01/14/2022, 2:07 PM   Clinical Narrative:     Pt to dc to [facility] today via [place]. RN to call report to [phone]. Sister Sharyn Lull was called to inform of transport.   Final next level of care: Skilled Nursing Facility Barriers to Discharge: No Barriers Identified   Patient Goals and CMS Choice Patient states their goals for this hospitalization and ongoing recovery are:: to go home CMS Medicare.gov Compare Post Acute Care list provided to:: Patient Represenative (must comment) Choice offered to / list presented to : Sibling  Discharge Placement              Patient chooses bed at: [facility] Patient to be transferred to facility by: [place] Name of family member notified: Sister Sharyn Lull    Discharge Plan and Services   Discharge Planning Services: CM Consult Post Acute Care Choice: [place]                Readmission Risk Interventions    12/22/2021    9:44 AM  Readmission Risk Prevention Plan  Transportation Screening Complete  Medication Review (RN Care Manager) Complete  HRI or Home Care Consult Complete  SW Recovery Care/Counseling Consult Complete  Palliative Care Screening Not Applicable  Skilled Nursing Facility Complete

## 2022-01-14 NOTE — Progress Notes (Signed)
Pt's lab work resulted, ok for discharge per MD. All pt belongings sent with pt at time of discharge. Receiving facility made aware. Discharge instructions and education reviewed with pt and placed in pt's discharge packet.

## 2022-01-14 NOTE — Discharge Summary (Addendum)
[facility]  Physician Discharge Summary   Patient ID: Shannon [name] MRN: [id number] DOB/AGE: [birth year] 66 y.o.  Admit date: 12/17/2021 Discharge date:   01/14/2022   PCP: [name], MD  DISCHARGE DIAGNOSES:  Principal Problem:   Abscess of right foot Active Problems:   HIV disease ([place])   Uncontrolled hypertension   Morbid (severe) obesity due to excess calories (HCC)   Atrial flutter (HCC)   Chronic respiratory failure with hypoxia (HCC)   Chronic anticoagulation   (HFpEF) heart failure with preserved ejection fraction (HCC)   Adenocarcinoma of lung (HCC)   Abscess of foot   Necrotizing fasciitis ([place])   Pressure injury of skin   Fungemia   Acute respiratory failure with hypercapnia (HCC)   Macrocytic anemia     RECOMMENDATIONS FOR OUTPATIENT FOLLOW UP: Check CBC and basic metabolic panel in 1 week Please be sure that patient follows up with Dr. Julien Nordmann at the cancer center in 2 to 3 weeks.  He is supposed to set up the appointment Follow-up with Dr. Iran Planas with orthopedics for right forearm Needs repeat MRI brain with contrast the week of June 11 Follow-up with Dr. Sharol Given in 1 week. Wound VAC to continue at discharge Monitor CBGs.    Home Health: Going to SNF Equipment/Devices: None  CODE STATUS: Full code  DISCHARGE CONDITION: fair  Diet recommendation: Carb modified diet  INITIAL HISTORY: 66 year old female with a history of metastatic adenocarcinoma of lung, COPD, HIV, was admitted to the hospital with right lower extremity wound, necrotizing fascitis,seen by orthopedics and underwent incision and drainage.  He was noted to have fungemia.  Infectious disease following for antibiotics/antifungals. Overall, her condition worsened with worsening mental status and respiratory status.She required intubation and transfer to ICU.  She was treated for COPD exacerbation and since then, respiratory status has stabilized and she was extubated on  5/1.  Orthopedics following for gas gangrene/necrotizing fasciitis of her right lower extremity and plans are for amputation, underwent Rt AKA at [facility] 12/26/21. Post op with anemia needing prbc transfusions x2. PTOT working and advised [place] COURSE:    Abscess of right foot, necrotizing fasciitis - Status post I&D on 12/20/2021 with OR culture revealing Klebsiella Proteus and Enterococcus - Status post right AKA on 5/5 - Completed course of Unasyn -Pain is adequately controlled on long-acting morphine.  We will add Percocet for breakthrough pain.  Neurontin was discontinued due to mental confusion. - Wound vac recently removed- Recently noted to be oozing blood with xarelto briefly held -Continued on anticoagulation without bleeding at this time   Right forearm fascial biopsy and fasciotomy- right hand and arm edema - suspicion of infected arm-infection ruled out- biopsy revealed dense fibroadipose tissue - Dr. Wynelle Cleveland d/w Dr Caralyn Guile who recommends to continue dressings- iodoform gauze being used   New finding of subacute CVA -Due to confusion, MRI of the brain was obtained - Found to have a 1.6 cm evolving acute to early subacute ischemic infarct in the right anterior temporal lobe without any associated hemorrhage or mass effect - neurology consulted, recommended MRI with contrast- unfortunately this has a great deal of motion artifact -Neurology has since recommended repeat MRI brain in 4 weeks w/ contrast to assess change  -Neuro recs to continue Biktarvy and monitor CD4 and avoid deliriogenic meds     Fungemia -Suspected to be as a result of the wound infection - Diflucan completed on 5/15 - ID has signed off - Ophthalmology  eval completed on 5/9-no eye involvement noted    Acute respiratory failure with hypoxia and hypercapnia (HCC) COPD exacerbation Probable OSA - Initially failed BiPAP and required intubation -Managed with nebulizer treatments. CPAP being encouraged.   Mentation seems to be stable.    Acute metabolic encephalopathy - RPR non reactive - Vit B12 normal - MRI brain on 5/13 suggestive of CVA as above, motion degradation noted. Neurology recs to repeat MRI in 4 weeks Odd behavior at times but no clear encephalopathy noted.  No focal deficits.     HIV disease ([place]) - Continue Biktarvy - CD4 count on 11/25/2021 was 143 - repeat CD 4 count of 68   DM 2 - sugars worsened by Decadron - A1c is 7.3 as of 12/17/21 - on Levemir and Novolog     Uncontrolled hypertension   (HFpEF) heart failure with preserved ejection fraction (HCC) Stable.  Continue home medications.   Morbid (severe) obesity due to excess calories (HCC) Body mass index is 37.88 kg/m. -Recommend diet/lifestyle modification     Atrial flutter (HCC) - Rate controlled on Cardizem and Coreg - Tolerating resumption of xarelto   Acute blood loss anemia with macrocytosis - Status post 1 unit of packed red blood cell transfusion on 5/7 and a second unit on 5/28 - Hemoglobin has been stable since    Adenocarcinoma of lung [facility]), recurrent metastatic non-small cell lung cancer - Status post curative radiotherapy to left upper lobe lung nodules-8/21 - Pt noted to have a recurrence as of Feb 2023> metastatic disease involving several areas of the bones including the ribs as well as thoracic spine and lumbar spine as well as sacrum.  She also has numerous small solid bilateral pulmonary nodules. - underwent CT-guided core biopsy of the metastatic lesion of the posterior right ninth rib and the final pathology was consistent with adenocarcinoma of the lung. - Follows with Dr. Earlie Server - on Decadron per oncology (for bony mets)- on PPI for GI protection   Diarrhea - resolved- rectal tube  removed   Thrombocytopenia -  resolved   Patient is stable.  Okay for discharge to SNF.   PERTINENT LABS:  The results of significant diagnostics from this hospitalization (including imaging,  microbiology, ancillary and laboratory) are listed below for reference.    Labs:   Basic Metabolic Panel: Recent Labs  Lab 01/09/22 0420 01/10/22 0630 01/11/22 0822 01/12/22 1135 01/13/22 0500  NA 138 142 139 142 142  K 4.9 4.8 4.5 4.7 4.5  CL 94* 96* 96* 100 99  CO2 40* 41* 40* 36* 36*  GLUCOSE 177* 158* 147* 152* 164*  BUN 51* 51* 53* 55* 59*  CREATININE 1.00 1.05* 1.03* 1.17* 1.06*  CALCIUM 10.2 10.4* 10.3 10.5* 10.3   Liver Function Tests: Recent Labs  Lab 01/09/22 0420 01/10/22 0630 01/11/22 0822 01/12/22 1135 01/13/22 0500  AST 14* 13* 14* 19 17  ALT 28 26 25 31  38  ALKPHOS 78 80 72 80 79  BILITOT 0.8 0.4 0.6 0.7 0.9  PROT 5.4* 5.1* 5.3* 5.3* 5.2*  ALBUMIN 2.7* 2.6* 2.7* 2.7* 2.7*    CBC: Recent Labs  Lab 01/09/22 1611 01/10/22 0630 01/12/22 1135 01/13/22 0500  WBC 8.9 8.4 10.7* 9.3  HGB 9.6* 9.0* 10.0* 9.4*  HCT 31.7* 29.3* 31.9* 30.1*  MCV 102.3* 101.7* 100.9* 101.3*  PLT 182 163 139* 125*    BNP: BNP (last 3 results) Recent Labs    06/30/21 1429 12/02/21 0210 12/17/21 1119  BNP 58.4 243.0*  155.2*     CBG: Recent Labs  Lab 01/13/22 0756 01/13/22 1214 01/13/22 1618 01/13/22 2114 01/14/22 0805  GLUCAP 143* 186* 153* 185* 121*     IMAGING STUDIES DG Chest 2 View  Result Date: 12/17/2021 CLINICAL DATA:  Shortness of breath, edema in the lower extremities EXAM: CHEST - 2 VIEW COMPARISON:  12/02/2021 FINDINGS: Transverse diameter of heart is increased. There are no signs of pulmonary edema or new focal infiltrates. Fiduciary markers are noted in the left parahilar region. Linear densities seen in the left parahilar region with interval improvement. There are no new focal infiltrates or signs of pulmonary edema. There is no pleural effusion or pneumothorax. Expansile deformities seen in the few bilateral ribs has not changed. IMPRESSION: There is interval decrease in linear densities in the left parahilar region adjacent to the fiduciary  markers. There are no signs of pulmonary edema or new focal pulmonary consolidation. Electronically Signed   By: Elmer Picker M.D.   On: 12/17/2021 12:27   DG Tibia/Fibula Right  Result Date: 12/17/2021 CLINICAL DATA:  Pain and swelling EXAM: RIGHT TIBIA AND FIBULA - 2 VIEW COMPARISON:  None. FINDINGS: No fracture or dislocation is seen. There are no focal lytic lesions. There is 6.3 cm smooth marginated soft tissue density in the medial aspect of right lower leg. Significance of this finding is not clear. There are no calcifications or pockets of air within this soft tissue density. Arterial calcifications are seen. Marked soft tissue edema around the right lower leg and right ankle. There are numerous pockets of air in the soft tissues along the dorsal aspect right forefoot suggesting possible gas gangrene. IMPRESSION: No fracture or dislocation is seen in the right tibia and fibula. There are no focal lytic lesions. There are numerous pockets of air in the soft tissues along the dorsal aspect of right foot suggesting infection with gas producing organisms, gas gangrene. Electronically Signed   By: Elmer Picker M.D.   On: 12/17/2021 12:34   DG Abd 1 View  Result Date: 12/20/2021 CLINICAL DATA:  Feeding tube placement. EXAM: ABDOMEN - 1 VIEW COMPARISON:  None. FINDINGS: A nasogastric tube is seen with its distal tip overlying the expected region of the gastric antrum. The bowel gas pattern is normal. No radio-opaque calculi or other significant radiographic abnormality are seen. An 8 mm soft tissue calcification is seen projecting over the mid left abdomen. This is confirmed to be extrarenal in location upon review of prior abdominal CT studies. IMPRESSION: Nasogastric tube positioning, as described above. Electronically Signed   By: Virgina Norfolk M.D.   On: 12/20/2021 18:14   MR BRAIN WO CONTRAST  Result Date: 01/04/2022 CLINICAL DATA:  Initial evaluation for delirium. History of  metastatic lung carcinoma, recent history of infection. EXAM: MRI HEAD WITHOUT CONTRAST TECHNIQUE: Multiplanar, multiecho pulse sequences of the brain and surrounding structures were obtained without intravenous contrast. COMPARISON:  Prior MRI from 11/03/2021. FINDINGS: Brain: Examination moderately to severely degraded by motion artifact. Generalized age-related cerebral atrophy. Scattered cerebral white matter disease, most characteristic of chronic microvascular ischemic disease, grossly similar to previous. Area of diffusion signal abnormality measuring 1.6 x 1.5 cm seen at the anterior right temporal lobe (series 5, image 67). This is also seen on coronal DWI (series 12, image 45). Associated signal loss on ADC map (series 6, image 16), with mild T2/FLAIR signal abnormality. Finding consistent with an evolving acute to early subacute ischemic infarct. No associated hemorrhage or significant mass effect. No  other evidence for acute or subacute ischemia on this motion degraded exam. Gray-white matter differentiation otherwise maintained. No visible acute intracranial hemorrhage. Punctate chronic microhemorrhage noted at the right thalamus. No visible mass lesion or mass effect. No midline shift or hydrocephalus. No visible extra-axial fluid collection. Susceptibility artifact related to prior right-sided craniotomy noted. Pituitary gland suprasellar region grossly normal. Vascular: Major intracranial vascular flow voids are grossly maintained on this motion degraded exam. Skull and upper cervical spine: Craniocervical junction grossly within normal limits. Heterogeneous and diffusely decreased signal intensity throughout the visualized bone marrow. Known metastatic lesion involving the right clivus without osseous expansion noted, similar to previous. Sinuses/Orbits: Globes and orbital soft tissues demonstrate no acute finding. Paranasal sinuses are largely clear. Trace bilateral mastoid effusions, of doubtful  significance. Other: None. IMPRESSION: 1. Motion degraded exam. 2. 1.6 cm evolving acute to early subacute ischemic infarct involving the anterior right temporal lobe. No associated hemorrhage or significant mass effect. 3. Underlying age-related atrophy with chronic microvascular ischemic disease. No other acute intracranial abnormality. 4. Known metastatic disease involving the right clivus, similar to previous. Electronically Signed   By: Jeannine Boga M.D.   On: 01/04/2022 05:16   MR BRAIN W CONTRAST  Result Date: 01/05/2022 CLINICAL DATA:  Stroke follow-up. History of metastatic lung cancer and HIV. EXAM: MRI HEAD WITH CONTRAST TECHNIQUE: Multiplanar, multiecho pulse sequences of the brain and surrounding structures were obtained with intravenous contrast. CONTRAST:  69mL GADAVIST GADOBUTROL 1 MMOL/ML IV SOLN COMPARISON:  Noncontrast head MRI 01/03/2022 FINDINGS: The study is moderately to severely motion degraded despite attempts at repeat imaging. No definite enhancement is identified in the anterior right temporal lobe at the site of restricted diffusion on the recent noncontrast MRI, and no definite abnormal intracranial enhancement is identified elsewhere although the degree of motion significantly reduces sensitivity for detection of small enhancing lesions. Prior right parietal craniotomy and a known right central skull base/clival metastasis are again noted. IMPRESSION: Motion degraded examination without definite abnormal intracranial enhancement. Electronically Signed   By: Logan Bores M.D.   On: 01/05/2022 14:37   CT FOREARM RIGHT WO CONTRAST  Result Date: 12/19/2021 CLINICAL DATA:  Soft tissue infection suspected. History of IV infiltration. EXAM: CT OF THE RIGHT FOREARM WITHOUT CONTRAST TECHNIQUE: Multidetector CT imaging was performed according to the standard protocol. Multiplanar CT image reconstructions were also generated. RADIATION DOSE REDUCTION: This exam was performed  according to the departmental dose-optimization program which includes automated exposure control, adjustment of the mA and/or kV according to patient size and/or use of iterative reconstruction technique. COMPARISON:  None. FINDINGS: Bones/Joint/Cartilage Bones are normal in density. No evidence of fracture or osteonecrosis. Evaluation of wrist bones is limited due to motion artifact. Ligaments Suboptimally assessed by CT. Muscles and Tendons Flexor and extensor muscles of the forearm or normal in bulk. No intramuscular fluid collection or abscess. Soft tissues There is marked skin thickening and subcutaneous soft tissue edema. There are multiple pockets of gas in the deep and superficial subcutaneous soft tissues of the anterior aspect of the forearm. The most prominent deep collection adjacent to the common extensor muscles measures approximately 0.8 x 3.2 by 3.4 cm. Multiple other 1-2 cm pockets of gas with marked surrounding edema highly suspicious for necrotizing fasciitis. There are multiple skin blisters over the anterior aspect of the forearm measuring 2-3 cm. There is also marked edema and skin thickening about the extensor aspect of the forearm without evidence of gas or fluid collection. IMPRESSION: 1. Skin  thickening and multiple pockets of superficial and deep subcutaneous gas in the anterior aspect of the proximal forearm with marked surrounding edema highly suspicious for necrotizing fasciitis. Correlate with physical examination findings. Follow-up examination with CT with contrast is recommended.\ 2. Multiple skin blisters about the anterior aspect of the forearm oral noted. 3. Muscles are normal in bulk. No intramuscular fluid collection or abscess. 4.  Osseous structures are unremarkable. Above findings were discussed with orthopedic team on-call. Electronically Signed   By: Keane Police D.O.   On: 12/19/2021 23:04   CT TIBIA FIBULA RIGHT WO CONTRAST  Result Date: 12/17/2021 CLINICAL DATA:   Soft tissue infection suspected, lower leg, xray done concern for nec fasc EXAM: CT OF THE LOWER RIGHT EXTREMITY WITHOUT CONTRAST CT OF THE RIGHT FOOT EXTREMITY WITHOUT CONTRAST TECHNIQUE: Multidetector CT imaging of the right lower extremity (tibia and fibula) was performed according to the standard protocol. Multidetector CT imaging of the right foot was performed according to the standard protocol. RADIATION DOSE REDUCTION: This exam was performed according to the departmental dose-optimization program which includes automated exposure control, adjustment of the mA and/or kV according to patient size and/or use of iterative reconstruction technique. COMPARISON:  Same day radiographs FINDINGS: Bones/Joint/Cartilage There is no acute osseous abnormality involving the tibia, fibula, or structures of the right foot. There is no frank bony destruction. There is no bony erosion. In the right foot, there is mild tibiotalar osteoarthritis, mild dorsal midfoot spurring, plantar and dorsal calcaneal spurring. Prominent os navicularis. Ligaments Suboptimally assessed by CT. Muscles and Tendons No acute myotendinous abnormality on noncontrast CT. Soft tissues There is extensive skin thickening and subcutaneous soft tissue swelling of the right lower extremity and foot. There is extensive soft tissue gas along the dorsal. There is no soft tissue gas in the hindfoot or above the ankle in the lower leg. There are multiple blister like skin lesions noted, correlate with exam. There is no well-defined/drainable fluid collection on noncontrast CT. IMPRESSION: Extensive dorsal midfoot soft tissue gas concerning for necrotizing soft tissue infection. No soft tissue gas in the hindfoot or above the ankle in the lower extremity. Extensive skin thickening and subcutaneous soft tissue swelling as can be seen in cellulitis. Multiple blister-like skin lesions noted, correlate with exam. No acute osseous abnormality. Electronically Signed    By: Maurine Simmering M.D.   On: 12/17/2021 17:04   CT FOOT RIGHT WO CONTRAST  Result Date: 12/17/2021 CLINICAL DATA:  Soft tissue infection suspected, lower leg, xray done concern for nec fasc EXAM: CT OF THE LOWER RIGHT EXTREMITY WITHOUT CONTRAST CT OF THE RIGHT FOOT EXTREMITY WITHOUT CONTRAST TECHNIQUE: Multidetector CT imaging of the right lower extremity (tibia and fibula) was performed according to the standard protocol. Multidetector CT imaging of the right foot was performed according to the standard protocol. RADIATION DOSE REDUCTION: This exam was performed according to the departmental dose-optimization program which includes automated exposure control, adjustment of the mA and/or kV according to patient size and/or use of iterative reconstruction technique. COMPARISON:  Same day radiographs FINDINGS: Bones/Joint/Cartilage There is no acute osseous abnormality involving the tibia, fibula, or structures of the right foot. There is no frank bony destruction. There is no bony erosion. In the right foot, there is mild tibiotalar osteoarthritis, mild dorsal midfoot spurring, plantar and dorsal calcaneal spurring. Prominent os navicularis. Ligaments Suboptimally assessed by CT. Muscles and Tendons No acute myotendinous abnormality on noncontrast CT. Soft tissues There is extensive skin thickening and subcutaneous soft tissue  swelling of the right lower extremity and foot. There is extensive soft tissue gas along the dorsal. There is no soft tissue gas in the hindfoot or above the ankle in the lower leg. There are multiple blister like skin lesions noted, correlate with exam. There is no well-defined/drainable fluid collection on noncontrast CT. IMPRESSION: Extensive dorsal midfoot soft tissue gas concerning for necrotizing soft tissue infection. No soft tissue gas in the hindfoot or above the ankle in the lower extremity. Extensive skin thickening and subcutaneous soft tissue swelling as can be seen in  cellulitis. Multiple blister-like skin lesions noted, correlate with exam. No acute osseous abnormality. Electronically Signed   By: Maurine Simmering M.D.   On: 12/17/2021 17:04   MR FOOT RIGHT W WO CONTRAST  Result Date: 12/17/2021 CLINICAL DATA:  Osteomyelitis of the foot suspected. EXAM: MRI OF THE RIGHT FOREFOOT WITHOUT AND WITH CONTRAST TECHNIQUE: Multiplanar, multisequence MR imaging of the right foot was performed before and after the administration of intravenous contrast. CONTRAST:  29mL GADAVIST GADOBUTROL 1 MMOL/ML IV SOLN COMPARISON:  None. FINDINGS: Bones/Joint/Cartilage No MR evidence of osteomyelitis. Mild degenerative changes of the first metatarsophalangeal joint and interphalangeal joint. No appreciable joint effusion of the tibiotalar subtalar and midfoot joints. Ligaments Lisfranc ligament is intact.  Collateral ligaments are intact. Muscles and Tendons Increased intrasubstance signal of the plantar muscles concerning for myopathy/myositis. No intramuscular fluid collection or abscess. Large abscess about the dorsum of the foot as described below which abuts the extensor tendons however no definite evidence of tenosynovitis. Soft tissues Large gas containing necrotizing fluid collection about the dorsum of the foot at the level of the metatarsals measuring at least 1.8 x 6.3 x 8.0 cm (AP x TR x CC) consistent with necrotizing soft tissue infections. IMPRESSION: 1. Large peripherally enhancing gas containing fluid collection about the dorsum of the foot at the level of the metatarsals measuring at least 1.8 x 6.3 x 8.0 cm consistent with necrotizing abscess. 2. The above-mentioned abscess abuts the extensor tendons of the foot without definite evidence of edema along the tendons to suggest tenosynovitis. 3. Increased intramuscular signal of the plantar muscles without evidence of fluid collection or abscess. 4.  No evidence of osteomyelitis. Electronically Signed   By: Keane Police D.O.   On:  12/17/2021 22:40   MR TIBIA FIBULA RIGHT W WO CONTRAST  Result Date: 12/17/2021 CLINICAL DATA:  Osteomyelitis suspected. Wounds to right leg. Fluid-filled blister visible above the bandage. EXAM: MRI OF LOWER RIGHT EXTREMITY WITHOUT AND WITH CONTRAST TECHNIQUE: Multiplanar, multisequence MR imaging of the right was performed both before and after administration of intravenous contrast. CONTRAST:  1mL GADAVIST GADOBUTROL 1 MMOL/ML IV SOLN COMPARISON:  CT examination performed earlier on the same date FINDINGS: Bones/Joint/Cartilage Bone marrow signal is within normal limits. No evidence of osteomyelitis. Ligaments Intraosseous ligament is unremarkable. Muscles and Tendons Mildly increased intramuscular signal of the flexor, extensor and peroneal compartments, likely reactive secondary to myositis. No intramuscular fluid collection or abscess. Soft tissues Marked skin thickening and subcutaneous soft tissue edema. Skin irregularity about the posterior aspect of the calf, likely multiple skin ulcers. There is a nonenhancing epidermal fluid collection in the anterior subcutaneous soft tissues measuring approximately 1.8 x 5.7 x 4.8 cm likely skin blister. There is skin wound and a peripherally enhancing fluid collection in the medial compartment of the lower leg measuring approximately 1.3 x 3.2 x 5.8 cm, most consistent with an abscess. IMPRESSION: 1.  No MR evidence of osteomyelitis  of tibia/fibula. 2. Increased intramuscular signal suggesting myositis without evidence of intramuscular fluid collection or abscess. 3. Marked skin thickening and subcutaneous soft tissue edema with skin irregularity in the posterior compartment of the calf muscles as well as inferiorly about the lateral aspect of the leg. 4. Peripherally enhancing fluid collection in the medial distal leg adjacent to the medial surface of the tibia measuring at least 1.3 x 3.2 x 5.8 cm. 5. A blister-like epididymal fluid collection in the anterior  aspect of the proximal leg. Electronically Signed   By: Keane Police D.O.   On: 12/17/2021 22:23   MR TIBIA FIBULA LEFT W WO CONTRAST  Result Date: 12/17/2021 CLINICAL DATA:  Osteomyelitis suspected. EXAM: MRI OF LOWER LEFT EXTREMITY WITHOUT AND WITH CONTRAST TECHNIQUE: Multiplanar, multisequence MR imaging of the left lower extremity from knee joint to the above ankle was performed both before and after administration of intravenous contrast. CONTRAST:  72mL GADAVIST GADOBUTROL 1 MMOL/ML IV SOLN COMPARISON:  None FINDINGS: Bones/Joint/Cartilage Bone marrow signal of the left tibia and fibula is within normal limits. No evidence of osteomyelitis. Ligaments Intraosseous ligament is intact. Muscles and Tendons Increased intramuscular signal in particular about the proximal thigh adjacent to with deep skin wound without evidence of intramuscular fluid collection or abscess. Soft tissues Marked skin thickening and subcutaneous soft tissue edema throughout the left lower extremity consistent with severe cellulitis. There is a large deep skin wound about the posterolateral aspect of the leg measures at least 9 cm in horizontal dimension and approximately 19 cm in craniocaudal dimension. No appreciable subcutaneous abscess or fluid collection. IMPRESSION: 1.  No evidence of osteomyelitis. 2. Myositis without evidence of intramuscular fluid collection or abscess. 3. Large deep crater like skin wound about the posterolateral aspect of the leg measuring 9 cm in transverse dimension and at least 19 cm in craniocaudal dimension with near complete loss of the skin. 4. Marked skin thickening and subcutaneous soft tissue edema consistent with severe cellulitis. Electronically Signed   By: Keane Police D.O.   On: 12/17/2021 22:31   DG Chest Port 1 View  Result Date: 01/06/2022 CLINICAL DATA:  Hypoxia, atrial fibrillation. EXAM: PORTABLE CHEST 1 VIEW COMPARISON:  12/20/2021 and prior radiographs FINDINGS: Cardiomegaly again  noted. A LEFT-sided PICC line is present with tip difficult to visualize but appears to overlie the LOWER SVC. A RIGHT PICC line is present with tip overlying the mid SVC. LEFT perihilar opacities with fiduciary markers are unchanged. There is no evidence of pneumothorax or pleural effusion. No acute bony abnormality identified. IMPRESSION: 1. No acute abnormalities. 2. Unchanged LEFT perihilar opacities with fiduciary markers. 3. PICC lines as described Electronically Signed   By: Margarette Canada M.D.   On: 01/06/2022 16:01   DG CHEST PORT 1 VIEW  Result Date: 12/20/2021 CLINICAL DATA:  The endotracheal tube and feeding tube placement. EXAM: PORTABLE CHEST 1 VIEW COMPARISON:  December 20, 2021 FINDINGS: An endotracheal tube is seen with its distal tip approximately 2.5 cm from the carina. A nasogastric tube is noted with its distal end extending below the level of the diaphragm. There is stable left-sided PICC line positioning. The heart size and mediastinal contours are within normal limits. Mild atelectatic changes are seen within the bilateral lung bases. There is no evidence of a pleural effusion or pneumothorax. Radiopaque surgical clips are seen overlying the left lung base. Multilevel degenerative changes are seen throughout the thoracic spine. IMPRESSION: 1. Interval endotracheal tube placement and positioning, as described  above. 2. Mild bibasilar atelectasis. Electronically Signed   By: Virgina Norfolk M.D.   On: 12/20/2021 18:11   DG CHEST PORT 1 VIEW  Result Date: 12/20/2021 CLINICAL DATA:  Hypoxia EXAM: PORTABLE CHEST 1 VIEW COMPARISON:  12/19/2021 FINDINGS: Interval placement of left upper extremity PICC line with distal tip terminating at the level of the distal SVC. Stable heart size. Unchanged lingular and left basilar opacity with adjacent fiduciary markers. Right lung is clear. No pneumothorax. IMPRESSION: New left upper extremity PICC line.  Otherwise stable chest. Electronically Signed   By:  Davina Poke D.O.   On: 12/20/2021 15:56   DG CHEST PORT 1 VIEW  Result Date: 12/19/2021 CLINICAL DATA:  Hypoxia EXAM: PORTABLE CHEST 1 VIEW COMPARISON:  Chest two views 12/17/2021, 12/02/2021, 11/10/2021; CT chest 12/02/2021 FINDINGS: Cardiac silhouette is again moderately enlarged. Mediastinal contours are unchanged. Mildly decreased lung volumes similar to prior.The right lung is clear. There is again scarring within the lingula in the region of 3 surgical clips. No new focal airspace opacity. No definite pleural effusion. No pneumothorax. No acute skeletal abnormality. Bilateral posterior rib expansile lesions are again noted remaining concerning for osseous metastases. Note is made the patient has a history of recurrent left perihilar lung cancer. IMPRESSION: No significant change in left lingular scarring adjacent to fiducial markers in the region of left perihilar recurrent lung cancer. No acute lung process is visualized. Electronically Signed   By: Yvonne Kendall M.D.   On: 12/19/2021 14:06   DG Foot Complete Right  Result Date: 12/17/2021 CLINICAL DATA:  Pain and swelling EXAM: RIGHT FOOT COMPLETE - 3+ VIEW COMPARISON:  None. FINDINGS: There are numerous pockets of air in the soft tissues along the dorsal aspect of the foot. There is marked soft tissue swelling. No fracture or dislocation is seen. No definite focal lytic lesions are seen. If there is clinical suspicion for osteomyelitis, follow-up three-phase bone scan or MRI may be considered. There are small metallic densities superimposed over the plantar aspect of calcaneus. Small plantar spur is seen in calcaneus. Small bony spurs seen in the intertarsal joints. IMPRESSION: There are numerous pockets of air in the soft tissues along the dorsal aspect of right foot suggesting gas gangrene. No fracture or dislocation is seen. No focal lytic lesions are seen. Imaging findings were relayed to patient's provider Dr. Regenia Skeeter by telephone call.  Electronically Signed   By: Elmer Picker M.D.   On: 12/17/2021 12:32   VAS [place] UPPER EXTREMITY VENOUS DUPLEX  Result Date: 12/19/2021 UPPER VENOUS STUDY  Patient Name:  TRESSA MALDONADO  Date of Exam:   12/19/2021 Medical Rec #: [id number]       Accession #:    [id number] Date of Birth: 1956/04/20       Patient Gender: F Patient Age:   23 years Exam Location:  [facility] Procedure:      VAS [place] UPPER EXTREMITY VENOUS DUPLEX Referring Phys: MICHAEL JEFFERY --------------------------------------------------------------------------------  Other Indications: Diffuse edema and blistering to RUE - previous IV placement. Limitations: Body habitus, poor ultrasound/tissue interface, patient movement and bandages. Comparison Study: No previous exams Performing Technologist: Jody Hill RVT, RDMS  Examination Guidelines: A complete evaluation includes B-mode imaging, spectral Doppler, color Doppler, and power Doppler as needed of all accessible portions of each vessel. Bilateral testing is considered an integral part of a complete examination. Limited examinations for reoccurring indications may be performed as noted.  Right Findings: +----------+------------+---------+-----------+----------+--------------------+ RIGHT     CompressiblePhasicitySpontaneousProperties  Summary        +----------+------------+---------+-----------+----------+--------------------+ IJV           Full       Yes       Yes                                   +----------+------------+---------+-----------+----------+--------------------+ Subclavian    Full       Yes       Yes                                   +----------+------------+---------+-----------+----------+--------------------+ Axillary      Full       Yes       Yes                                   +----------+------------+---------+-----------+----------+--------------------+ Brachial                 Yes       Yes                   patent by                                                               color/doppler     +----------+------------+---------+-----------+----------+--------------------+ Radial                                                 Not visualized    +----------+------------+---------+-----------+----------+--------------------+ Ulnar                                                  Not visualized    +----------+------------+---------+-----------+----------+--------------------+ Cephalic                                               Not visualized    +----------+------------+---------+-----------+----------+--------------------+ Basilic                                                Not visualized    +----------+------------+---------+-----------+----------+--------------------+ Bandage to entire forearm. Diffuse edema to RUE.  Left Findings: +----------+------------+---------+-----------+----------+--------------+ LEFT      CompressiblePhasicitySpontaneousProperties   Summary     +----------+------------+---------+-----------+----------+--------------+ Subclavian                                          Not visualized +----------+------------+---------+-----------+----------+--------------+ Patient uncooperative - unable to visualize left subclavian vein.  Summary:  Right: No evidence of deep  vein thrombosis in the upper extremity. However, unable to visualize the cephalic, basilic, radial and ulnar veins.  *See table(s) above for measurements and observations.  Diagnosing physician: Orlie Pollen Electronically signed by Orlie Pollen on 12/19/2021 at 6:06:15 PM.    Final    [place] EKG SITE RITE  Result Date: 12/19/2021 If Site Rite image not attached, placement could not be confirmed due to current cardiac rhythm.   DISCHARGE EXAMINATION: Vitals:   01/13/22 0816 01/13/22 1621 01/13/22 2106 01/14/22 0700  BP:  117/68 136/71 (!) 155/108  Pulse:  82 68 90  Resp:  20 17 17   Temp:   98.2 F (36.8 C) 98 F (36.7 C) 97.8 F (36.6 C)  TempSrc:  Oral Oral Oral  SpO2: 95% 99% 98% 100%  Weight:      Height:       General appearance: Awake alert.  In no distress.  Mildly distracted Resp: Clear to auscultation bilaterally.  Normal effort Cardio: S1-S2 is normal regular.  No S3-S4.  No rubs murmurs or bruit GI: Abdomen is soft.  Nontender nondistended.  Bowel sounds are present normal.  No masses organomegaly    DISPOSITION: SNF Discharge Instructions     Amb referral to [facility]   Complete by: As directed    Call MD for:  difficulty breathing, headache or visual disturbances   Complete by: As directed    Call MD for:  extreme fatigue   Complete by: As directed    Call MD for:  persistant dizziness or light-headedness   Complete by: As directed    Call MD for:  persistant nausea and vomiting   Complete by: As directed    Call MD for:  severe uncontrolled pain   Complete by: As directed    Call MD for:  temperature >100.4   Complete by: As directed    Diet - low sodium heart healthy   Complete by: As directed    Discharge instructions   Complete by: As directed    Please review instructions on the discharge summary.  You were cared for by a hospitalist during your hospital stay. If you have any questions about your discharge medications or the care you received while you were in the hospital after you are discharged, you can call the unit and asked to speak with the hospitalist on call if the hospitalist that took care of you is not available. Once you are discharged, your primary care physician will handle any further medical issues. Please note that NO REFILLS for any discharge medications will be authorized once you are discharged, as it is imperative that you return to your primary care physician (or establish a relationship with a primary care physician if you do not have one) for your aftercare needs so that they can reassess your need for medications and  monitor your lab values. If you do not have a primary care physician, you can call [phone] for a physician referral.   Discharge wound care:   Complete by: As directed    Wound care  Daily      Comments: Wound care to R arm site of IV infiltrate:  Cleanse with NS, pat dry. Cover blisters with folded layers of xeroform gauze, top with ABD pads and secure with Kerlix roll gauze/paper tape. Change daily and PRN drainage strike through.   Increase activity slowly   Complete by: As directed           Allergies as of 01/14/2022  Reactions   Lisinopril Swelling, Cough   Face/throat swelling   Tree Extract Swelling, Other (See Comments)   Swelling to eyes   Augmentin [amoxicillin-pot Clavulanate] Other (See Comments)   Headache, dizzy   Ciprofloxacin Hives        Medication List     STOP taking these medications    amoxicillin 500 MG tablet Commonly known as: AMOXIL   Biotene Dry Mouth Gentle Liqd   BIOTIN PO   CALCIUM PO   diltiazem 240 MG 24 hr capsule Commonly known as: CARDIZEM CD   famotidine 40 MG tablet Commonly known as: PEPCID   furosemide 20 MG tablet Commonly known as: LASIX   magnesium hydroxide 400 MG/5ML suspension Commonly known as: MILK OF MAGNESIA   melatonin 5 MG Tabs   ondansetron 4 MG tablet Commonly known as: Zofran   Oxycodone HCl 10 MG Tabs   potassium chloride 10 MEQ tablet Commonly known as: KLOR-CON       TAKE these medications    A&D Oint Apply 1 application. topically daily as needed (rash).   albuterol (2.5 MG/3ML) 0.083% nebulizer solution Commonly known as: PROVENTIL USE 1 VIAL VIA NEBULIZER EVERY 4 HOURS AS NEEDED FOR WHEEZING OR SHORTNESS OF BREATH What changed: See the new instructions.   b complex vitamins tablet Take 1 tablet by mouth daily.   B-D SYRINGE/NEEDLE 1CC/25GX5/8 25G X 5/8" 1 ML Misc Generic drug: SYRINGE/NEEDLE (DISP) 1 ML 1 Units by Does not apply route every 30 (thirty) days.   Biktarvy  50-200-25 MG Tabs tablet Generic drug: bictegravir-emtricitabine-tenofovir AF TAKE 1 TABLET BY MOUTH DAILY   CALCIUM + D PO Take 1 tablet by mouth every other day.   carvedilol 12.5 MG tablet Commonly known as: COREG Take 1 tablet (12.5 mg total) by mouth 2 (two) times daily with a meal.   CLEAR EYES OP Place 1 drop into both eyes daily as needed (redness).   cyanocobalamin 1000 MCG/ML injection Commonly known as: (VITAMIN B-12) ADMINISTER 1 ML(1000 MCG) IN THE MUSCLE EVERY 30 DAYS What changed: See the new instructions.   dexamethasone 4 MG tablet Commonly known as: DECADRON Take 1 tablet (4 mg total) by mouth 2 (two) times daily with a meal.   docusate sodium 100 MG capsule Commonly known as: COLACE Take 1 capsule (100 mg total) by mouth daily.   feeding supplement Liqd Take 237 mLs by mouth 2 (two) times daily between meals.   hydrALAZINE 25 MG tablet Commonly known as: APRESOLINE Take 1 tablet (25 mg total) by mouth 3 (three) times daily.   insulin detemir 100 UNIT/ML injection Commonly known as: LEVEMIR Inject 0.08 mLs (8 Units total) into the skin daily.   mometasone-formoterol 100-5 MCG/ACT Aero Commonly known as: DULERA Inhale 2 puffs into the lungs 2 (two) times daily.   morphine 15 MG 12 hr tablet Commonly known as: MS CONTIN Take 1 tablet by mouth every 12 hours.   neomycin-bacitracin-polymyxin 400-12-4998 ointment Commonly known as: NEOSPORIN Apply 1 application. topically daily as needed for wound care.   oxyCODONE-acetaminophen 5-325 MG tablet Commonly known as: Percocet Take 1 tablet by mouth every 4 (four) hours as needed for severe pain.   OXYGEN Inhale 1.5-2 L into the lungs See admin instructions. Uses at bedtime and through the day as needed   pantoprazole 40 MG tablet Commonly known as: PROTONIX Take 1 tablet (40 mg total) by mouth daily.   PATADAY OP Place 1 drop into both eyes daily as needed (burning).  polyethylene glycol 17 g  packet Commonly known as: MIRALAX / GLYCOLAX Take 17 g by mouth daily as needed for mild constipation.   PREPARATION H RE Place 1 application. rectally daily as needed (hemorrhoids).   Pulse Oximeter For Finger Misc 1 Units by Does not apply route as needed.   spironolactone 25 MG tablet Commonly known as: ALDACTONE Take 1 tablet (25 mg total) by mouth daily.   SYSTANE OP Place 1 drop into both eyes daily as needed (dry eyes).   vitamin C 1000 MG tablet Take 1,000 mg by mouth every other day.   Xarelto 20 MG Tabs tablet Generic drug: rivaroxaban TAKE 1 TABLET(20 MG) BY MOUTH DAILY What changed: See the new instructions.               Discharge Care Instructions  (From admission, onward)           Start     Ordered   01/14/22 0000  Discharge wound care:       Comments: Wound care  Daily      Comments: Wound care to R arm site of IV infiltrate:  Cleanse with NS, pat dry. Cover blisters with folded layers of xeroform gauze, top with ABD pads and secure with Kerlix roll gauze/paper tape. Change daily and PRN drainage strike through.   01/14/22 0935              Follow-up Information     Newt Minion, MD Follow up in 1 week(s).   Specialty: Orthopedic Surgery Contact information: [place] [address] [phone]         Iran Planas, MD. Schedule an appointment as soon as possible for a visit in 1 week(s).   Specialty: Orthopedic Surgery Contact information: [address] [place] [address] [phone]         Curt Bears, MD .   Specialty: Oncology Contact information: [place] [address] [phone]                 TOTAL DISCHARGE TIME: 58 minutes  Bonnielee Haff  [facility] Pager on [email or web address]  01/14/2022, 9:36 AM

## 2022-01-14 NOTE — Progress Notes (Signed)
LA DL PICC removed per protocol per MD order. Manual pressure applied for 5 mins. Vaseline gauze, gauze, and Tegaderm applied over insertion site. No bleeding or swelling noted. Instructed patient to remain in bed for thirty mins. Educated patient about S/S of infection and when to call MD; no heavy lifting or pressure on left side for 24 hours; keep dressing dry and intact for 24 hours. Pt verbalized comprehension.

## 2022-01-14 NOTE — Progress Notes (Signed)
Report called to Janett Billow, Therapist, sports at [facility].

## 2022-01-14 NOTE — Progress Notes (Signed)
Attempted to call report to [employer] receiving RN. RN unable to take report at this time. Will try again later.

## 2022-01-15 ENCOUNTER — Emergency Department (HOSPITAL_COMMUNITY): Payer: Medicare Other

## 2022-01-15 ENCOUNTER — Encounter (HOSPITAL_COMMUNITY): Payer: Self-pay

## 2022-01-15 ENCOUNTER — Telehealth: Payer: Self-pay | Admitting: Orthopedic Surgery

## 2022-01-15 ENCOUNTER — Inpatient Hospital Stay (HOSPITAL_COMMUNITY)
Admission: EM | Admit: 2022-01-15 | Discharge: 2022-01-23 | DRG: 603 | Disposition: A | Discharge status: Skilled Nursing Facility | Payer: Medicare Other | Source: Skilled Nursing Facility | Attending: Internal Medicine | Admitting: Internal Medicine

## 2022-01-15 DIAGNOSIS — R9431 Abnormal electrocardiogram [ECG] [EKG]: Secondary | ICD-10-CM | POA: Diagnosis not present

## 2022-01-15 DIAGNOSIS — R609 Edema, unspecified: Secondary | ICD-10-CM | POA: Diagnosis not present

## 2022-01-15 DIAGNOSIS — I471 Supraventricular tachycardia: Secondary | ICD-10-CM | POA: Diagnosis not present

## 2022-01-15 DIAGNOSIS — D539 Nutritional anemia, unspecified: Secondary | ICD-10-CM | POA: Diagnosis present

## 2022-01-15 DIAGNOSIS — Z1152 Encounter for screening for COVID-19: Secondary | ICD-10-CM | POA: Diagnosis not present

## 2022-01-15 DIAGNOSIS — E785 Hyperlipidemia, unspecified: Secondary | ICD-10-CM | POA: Diagnosis present

## 2022-01-15 DIAGNOSIS — S5011XA Contusion of right forearm, initial encounter: Secondary | ICD-10-CM | POA: Diagnosis not present

## 2022-01-15 DIAGNOSIS — I5030 Unspecified diastolic (congestive) heart failure: Secondary | ICD-10-CM | POA: Diagnosis present

## 2022-01-15 DIAGNOSIS — S51801A Unspecified open wound of right forearm, initial encounter: Secondary | ICD-10-CM | POA: Diagnosis not present

## 2022-01-15 DIAGNOSIS — T8149XA Infection following a procedure, other surgical site, initial encounter: Secondary | ICD-10-CM

## 2022-01-15 DIAGNOSIS — Z794 Long term (current) use of insulin: Secondary | ICD-10-CM

## 2022-01-15 DIAGNOSIS — E119 Type 2 diabetes mellitus without complications: Secondary | ICD-10-CM

## 2022-01-15 DIAGNOSIS — Z4889 Encounter for other specified surgical aftercare: Secondary | ICD-10-CM | POA: Diagnosis not present

## 2022-01-15 DIAGNOSIS — J9611 Chronic respiratory failure with hypoxia: Secondary | ICD-10-CM | POA: Diagnosis not present

## 2022-01-15 DIAGNOSIS — N183 Chronic kidney disease, stage 3 unspecified: Secondary | ICD-10-CM | POA: Diagnosis present

## 2022-01-15 DIAGNOSIS — D696 Thrombocytopenia, unspecified: Secondary | ICD-10-CM | POA: Diagnosis not present

## 2022-01-15 DIAGNOSIS — L03113 Cellulitis of right upper limb: Principal | ICD-10-CM | POA: Diagnosis present

## 2022-01-15 DIAGNOSIS — Z6834 Body mass index (BMI) 34.0-34.9, adult: Secondary | ICD-10-CM

## 2022-01-15 DIAGNOSIS — E1122 Type 2 diabetes mellitus with diabetic chronic kidney disease: Secondary | ICD-10-CM | POA: Diagnosis present

## 2022-01-15 DIAGNOSIS — G4733 Obstructive sleep apnea (adult) (pediatric): Secondary | ICD-10-CM | POA: Diagnosis present

## 2022-01-15 DIAGNOSIS — A419 Sepsis, unspecified organism: Secondary | ICD-10-CM | POA: Diagnosis not present

## 2022-01-15 DIAGNOSIS — I2510 Atherosclerotic heart disease of native coronary artery without angina pectoris: Secondary | ICD-10-CM | POA: Diagnosis present

## 2022-01-15 DIAGNOSIS — M726 Necrotizing fasciitis: Secondary | ICD-10-CM | POA: Diagnosis not present

## 2022-01-15 DIAGNOSIS — I4892 Unspecified atrial flutter: Secondary | ICD-10-CM | POA: Diagnosis not present

## 2022-01-15 DIAGNOSIS — Z86711 Personal history of pulmonary embolism: Secondary | ICD-10-CM

## 2022-01-15 DIAGNOSIS — N182 Chronic kidney disease, stage 2 (mild): Secondary | ICD-10-CM | POA: Diagnosis present

## 2022-01-15 DIAGNOSIS — I10 Essential (primary) hypertension: Secondary | ICD-10-CM | POA: Diagnosis not present

## 2022-01-15 DIAGNOSIS — L539 Erythematous condition, unspecified: Secondary | ICD-10-CM | POA: Diagnosis not present

## 2022-01-15 DIAGNOSIS — Z881 Allergy status to other antibiotic agents status: Secondary | ICD-10-CM

## 2022-01-15 DIAGNOSIS — Z888 Allergy status to other drugs, medicaments and biological substances status: Secondary | ICD-10-CM

## 2022-01-15 DIAGNOSIS — Z743 Need for continuous supervision: Secondary | ICD-10-CM | POA: Diagnosis not present

## 2022-01-15 DIAGNOSIS — I5032 Chronic diastolic (congestive) heart failure: Secondary | ICD-10-CM | POA: Diagnosis present

## 2022-01-15 DIAGNOSIS — L03115 Cellulitis of right lower limb: Secondary | ICD-10-CM | POA: Diagnosis not present

## 2022-01-15 DIAGNOSIS — Z833 Family history of diabetes mellitus: Secondary | ICD-10-CM

## 2022-01-15 DIAGNOSIS — F05 Delirium due to known physiological condition: Secondary | ICD-10-CM | POA: Diagnosis present

## 2022-01-15 DIAGNOSIS — M7989 Other specified soft tissue disorders: Secondary | ICD-10-CM | POA: Diagnosis not present

## 2022-01-15 DIAGNOSIS — Z8349 Family history of other endocrine, nutritional and metabolic diseases: Secondary | ICD-10-CM

## 2022-01-15 DIAGNOSIS — T8743 Infection of amputation stump, right lower extremity: Secondary | ICD-10-CM | POA: Diagnosis not present

## 2022-01-15 DIAGNOSIS — Z7952 Long term (current) use of systemic steroids: Secondary | ICD-10-CM

## 2022-01-15 DIAGNOSIS — Z79899 Other long term (current) drug therapy: Secondary | ICD-10-CM

## 2022-01-15 DIAGNOSIS — J449 Chronic obstructive pulmonary disease, unspecified: Secondary | ICD-10-CM | POA: Diagnosis present

## 2022-01-15 DIAGNOSIS — L0390 Cellulitis, unspecified: Secondary | ICD-10-CM | POA: Diagnosis present

## 2022-01-15 DIAGNOSIS — Z8249 Family history of ischemic heart disease and other diseases of the circulatory system: Secondary | ICD-10-CM

## 2022-01-15 DIAGNOSIS — Z9981 Dependence on supplemental oxygen: Secondary | ICD-10-CM

## 2022-01-15 DIAGNOSIS — Z7901 Long term (current) use of anticoagulants: Secondary | ICD-10-CM

## 2022-01-15 DIAGNOSIS — T8781 Dehiscence of amputation stump: Secondary | ICD-10-CM | POA: Diagnosis not present

## 2022-01-15 DIAGNOSIS — I483 Typical atrial flutter: Secondary | ICD-10-CM | POA: Diagnosis not present

## 2022-01-15 DIAGNOSIS — Z880 Allergy status to penicillin: Secondary | ICD-10-CM

## 2022-01-15 DIAGNOSIS — L7632 Postprocedural hematoma of skin and subcutaneous tissue following other procedure: Secondary | ICD-10-CM | POA: Diagnosis not present

## 2022-01-15 DIAGNOSIS — Y835 Amputation of limb(s) as the cause of abnormal reaction of the patient, or of later complication, without mention of misadventure at the time of the procedure: Secondary | ICD-10-CM | POA: Diagnosis not present

## 2022-01-15 DIAGNOSIS — Z89611 Acquired absence of right leg above knee: Secondary | ICD-10-CM | POA: Diagnosis not present

## 2022-01-15 DIAGNOSIS — I130 Hypertensive heart and chronic kidney disease with heart failure and stage 1 through stage 4 chronic kidney disease, or unspecified chronic kidney disease: Secondary | ICD-10-CM | POA: Diagnosis present

## 2022-01-15 DIAGNOSIS — Z923 Personal history of irradiation: Secondary | ICD-10-CM

## 2022-01-15 DIAGNOSIS — K219 Gastro-esophageal reflux disease without esophagitis: Secondary | ICD-10-CM | POA: Diagnosis present

## 2022-01-15 DIAGNOSIS — B20 Human immunodeficiency virus [HIV] disease: Secondary | ICD-10-CM | POA: Diagnosis not present

## 2022-01-15 DIAGNOSIS — Z8739 Personal history of other diseases of the musculoskeletal system and connective tissue: Secondary | ICD-10-CM

## 2022-01-15 DIAGNOSIS — C7951 Secondary malignant neoplasm of bone: Secondary | ICD-10-CM | POA: Diagnosis present

## 2022-01-15 DIAGNOSIS — Z8673 Personal history of transient ischemic attack (TIA), and cerebral infarction without residual deficits: Secondary | ICD-10-CM

## 2022-01-15 DIAGNOSIS — R58 Hemorrhage, not elsewhere classified: Secondary | ICD-10-CM | POA: Diagnosis not present

## 2022-01-15 DIAGNOSIS — R600 Localized edema: Secondary | ICD-10-CM | POA: Diagnosis not present

## 2022-01-15 DIAGNOSIS — Z825 Family history of asthma and other chronic lower respiratory diseases: Secondary | ICD-10-CM

## 2022-01-15 DIAGNOSIS — Z85118 Personal history of other malignant neoplasm of bronchus and lung: Secondary | ICD-10-CM

## 2022-01-15 DIAGNOSIS — I5033 Acute on chronic diastolic (congestive) heart failure: Secondary | ICD-10-CM | POA: Diagnosis not present

## 2022-01-15 DIAGNOSIS — R419 Unspecified symptoms and signs involving cognitive functions and awareness: Secondary | ICD-10-CM | POA: Diagnosis present

## 2022-01-15 DIAGNOSIS — T8142XA Infection following a procedure, deep incisional surgical site, initial encounter: Secondary | ICD-10-CM | POA: Diagnosis not present

## 2022-01-15 DIAGNOSIS — Z89511 Acquired absence of right leg below knee: Secondary | ICD-10-CM | POA: Diagnosis not present

## 2022-01-15 DIAGNOSIS — Z87891 Personal history of nicotine dependence: Secondary | ICD-10-CM

## 2022-01-15 LAB — PROTIME-INR
INR: 1.1 (ref 0.8–1.2)
Prothrombin Time: 14.5 seconds (ref 11.4–15.2)

## 2022-01-15 LAB — COMPREHENSIVE METABOLIC PANEL
ALT: 44 U/L (ref 0–44)
AST: 26 U/L (ref 15–41)
Albumin: 2.9 g/dL — ABNORMAL LOW (ref 3.5–5.0)
Alkaline Phosphatase: 84 U/L (ref 38–126)
Anion gap: 8 (ref 5–15)
BUN: 50 mg/dL — ABNORMAL HIGH (ref 8–23)
CO2: 34 mmol/L — ABNORMAL HIGH (ref 22–32)
Calcium: 10.2 mg/dL (ref 8.9–10.3)
Chloride: 103 mmol/L (ref 98–111)
Creatinine, Ser: 1.15 mg/dL — ABNORMAL HIGH (ref 0.44–1.00)
GFR, Estimated: 52 mL/min — ABNORMAL LOW (ref >60)
Glucose, Bld: 258 mg/dL — ABNORMAL HIGH (ref 70–99)
Potassium: 4.9 mmol/L (ref 3.5–5.1)
Sodium: 145 mmol/L (ref 135–145)
Total Bilirubin: 0.5 mg/dL (ref 0.3–1.2)
Total Protein: 5.7 g/dL — ABNORMAL LOW (ref 6.5–8.1)

## 2022-01-15 LAB — CBC WITH DIFFERENTIAL/PLATELET
Abs Immature Granulocytes: 0.06 10*3/uL (ref 0.00–0.07)
Basophils Absolute: 0 10*3/uL (ref 0.0–0.1)
Basophils Relative: 0 %
Eosinophils Absolute: 0.1 10*3/uL (ref 0.0–0.5)
Eosinophils Relative: 1 %
HCT: 32.9 % — ABNORMAL LOW (ref 36.0–46.0)
Hemoglobin: 10.1 g/dL — ABNORMAL LOW (ref 12.0–15.0)
Immature Granulocytes: 1 %
Lymphocytes Relative: 4 %
Lymphs Abs: 0.4 10*3/uL — ABNORMAL LOW (ref 0.7–4.0)
MCH: 31.7 pg (ref 26.0–34.0)
MCHC: 30.7 g/dL (ref 30.0–36.0)
MCV: 103.1 fL — ABNORMAL HIGH (ref 80.0–100.0)
Monocytes Absolute: 0.4 10*3/uL (ref 0.1–1.0)
Monocytes Relative: 4 %
Neutro Abs: 8.3 10*3/uL — ABNORMAL HIGH (ref 1.7–7.7)
Neutrophils Relative %: 90 %
Platelets: 106 10*3/uL — ABNORMAL LOW (ref 150–400)
RBC: 3.19 MIL/uL — ABNORMAL LOW (ref 3.87–5.11)
RDW: 16.8 % — ABNORMAL HIGH (ref 11.5–15.5)
WBC: 9.2 10*3/uL (ref 4.0–10.5)
nRBC: 0 % (ref 0.0–0.2)

## 2022-01-15 LAB — APTT: aPTT: 24 seconds (ref 24–36)

## 2022-01-15 LAB — URINALYSIS, ROUTINE W REFLEX MICROSCOPIC
Bilirubin Urine: NEGATIVE
Glucose, UA: 50 mg/dL — AB
Hgb urine dipstick: NEGATIVE
Ketones, ur: NEGATIVE mg/dL
Leukocytes,Ua: NEGATIVE
Nitrite: NEGATIVE
Protein, ur: NEGATIVE mg/dL
Specific Gravity, Urine: 1.019 (ref 1.005–1.030)
pH: 5 (ref 5.0–8.0)

## 2022-01-15 LAB — LACTIC ACID, PLASMA: Lactic Acid, Venous: 1 mmol/L (ref 0.5–1.9)

## 2022-01-15 LAB — RESP PANEL BY RT-PCR (FLU A&B, COVID) ARPGX2
Influenza A by PCR: NEGATIVE
Influenza B by PCR: NEGATIVE
SARS Coronavirus 2 by RT PCR: NEGATIVE

## 2022-01-15 LAB — GLUCOSE, CAPILLARY: Glucose-Capillary: 207 mg/dL — ABNORMAL HIGH (ref 70–99)

## 2022-01-15 MED ORDER — HYDROXYZINE HCL 25 MG PO TABS
25.0000 mg | ORAL_TABLET | Freq: Three times a day (TID) | ORAL | Status: DC | PRN
Start: 2022-01-15 — End: 2022-01-15

## 2022-01-15 MED ORDER — ONDANSETRON HCL 4 MG/2ML IJ SOLN: 4.0000 mg | Freq: Four times a day (QID) | INTRAMUSCULAR | Status: DC | PRN

## 2022-01-15 MED ORDER — SALINE SPRAY 0.65 % NA SOLN: 1.0000 | NASAL | Status: DC | PRN

## 2022-01-15 MED ORDER — SODIUM CHLORIDE 0.9 % IV SOLN
2.0000 g | Freq: Two times a day (BID) | INTRAVENOUS | Status: DC
  Administered 2022-01-16 (×2): 2 g via INTRAVENOUS
  Filled 2022-01-15 (×3): qty 12.5

## 2022-01-15 MED ORDER — INSULIN ASPART 100 UNIT/ML IJ SOLN
0.0000 [IU] | Freq: Every day | INTRAMUSCULAR | Status: DC
  Administered 2022-01-15: 2 [IU] via SUBCUTANEOUS
  Administered 2022-01-16 – 2022-01-18 (×2): 3 [IU] via SUBCUTANEOUS
  Administered 2022-01-22: 1 [IU] via SUBCUTANEOUS

## 2022-01-15 MED ORDER — IPRATROPIUM-ALBUTEROL 0.5-2.5 (3) MG/3ML IN SOLN
3.0000 mL | RESPIRATORY_TRACT | Status: DC | PRN
  Administered 2022-01-21 – 2022-01-22 (×4): 3 mL via RESPIRATORY_TRACT
  Filled 2022-01-15 (×4): qty 3

## 2022-01-15 MED ORDER — SENNOSIDES-DOCUSATE SODIUM 8.6-50 MG PO TABS: 1.0000 | ORAL_TABLET | Freq: Every evening | ORAL | Status: DC | PRN

## 2022-01-15 MED ORDER — VANCOMYCIN HCL IN DEXTROSE 1-5 GM/200ML-% IV SOLN: 1000.0000 mg | Freq: Once | INTRAVENOUS | Status: DC

## 2022-01-15 MED ORDER — METOCLOPRAMIDE HCL 5 MG/ML IJ SOLN: 10.0000 mg | Freq: Four times a day (QID) | INTRAMUSCULAR | Status: DC | PRN

## 2022-01-15 MED ORDER — MORPHINE SULFATE ER 15 MG PO TBCR
15.0000 mg | EXTENDED_RELEASE_TABLET | Freq: Two times a day (BID) | ORAL | Status: DC
Start: 2022-01-15 — End: 2022-01-15

## 2022-01-15 MED ORDER — INSULIN ASPART 100 UNIT/ML IJ SOLN
0.0000 [IU] | Freq: Three times a day (TID) | INTRAMUSCULAR | Status: DC
  Administered 2022-01-17 – 2022-01-22 (×3): 1 [IU] via SUBCUTANEOUS

## 2022-01-15 MED ORDER — HYDRALAZINE HCL 20 MG/ML IJ SOLN
10.0000 mg | INTRAMUSCULAR | Status: DC | PRN
Start: 2022-01-15 — End: 2022-01-23

## 2022-01-15 MED ORDER — VANCOMYCIN HCL 750 MG/150ML IV SOLN
750.0000 mg | INTRAVENOUS | Status: DC
  Administered 2022-01-16: 750 mg via INTRAVENOUS
  Filled 2022-01-15: qty 150

## 2022-01-15 MED ORDER — LIP MEDEX EX OINT: 1.0000 "application " | TOPICAL_OINTMENT | CUTANEOUS | Status: DC | PRN

## 2022-01-15 MED ORDER — CARVEDILOL 12.5 MG PO TABS
12.5000 mg | ORAL_TABLET | Freq: Two times a day (BID) | ORAL | Status: DC
  Administered 2022-01-15 – 2022-01-22 (×15): 12.5 mg via ORAL
  Filled 2022-01-15 (×16): qty 1

## 2022-01-15 MED ORDER — LACTATED RINGERS IV BOLUS (SEPSIS)
500.0000 mL | Freq: Once | INTRAVENOUS | Status: AC
  Administered 2022-01-15: 500 mL via INTRAVENOUS

## 2022-01-15 MED ORDER — HYDRALAZINE HCL 20 MG/ML IJ SOLN: 10.0000 mg | INTRAMUSCULAR | Status: DC | PRN

## 2022-01-15 MED ORDER — SODIUM CHLORIDE 0.9 % IV SOLN: INTRAVENOUS | Status: AC

## 2022-01-15 MED ORDER — LORATADINE 10 MG PO TABS
10.0000 mg | ORAL_TABLET | Freq: Every day | ORAL | Status: DC | PRN
Start: 2022-01-15 — End: 2022-01-23

## 2022-01-15 MED ORDER — GUAIFENESIN 100 MG/5ML PO LIQD
5.0000 mL | ORAL | Status: DC | PRN
Start: 2022-01-15 — End: 2022-01-23

## 2022-01-15 MED ORDER — ALUM & MAG HYDROXIDE-SIMETH 200-200-20 MG/5ML PO SUSP: 30.0000 mL | ORAL | Status: DC | PRN

## 2022-01-15 MED ORDER — HYDROCORTISONE 1 % EX CREA: 1.0000 "application " | TOPICAL_CREAM | Freq: Three times a day (TID) | CUTANEOUS | Status: DC | PRN

## 2022-01-15 MED ORDER — GUAIFENESIN 100 MG/5ML PO LIQD: 5.0000 mL | ORAL | Status: DC | PRN

## 2022-01-15 MED ORDER — PHENOL 1.4 % MT LIQD: 1.0000 | OROMUCOSAL | Status: DC | PRN

## 2022-01-15 MED ORDER — LORATADINE 10 MG PO TABS
10.0000 mg | ORAL_TABLET | Freq: Every day | ORAL | Status: DC | PRN
Start: 2022-01-15 — End: 2022-01-15

## 2022-01-15 MED ORDER — ACETAMINOPHEN 325 MG PO TABS: 650.0000 mg | ORAL_TABLET | Freq: Four times a day (QID) | ORAL | Status: DC | PRN

## 2022-01-15 MED ORDER — PANTOPRAZOLE SODIUM 40 MG PO TBEC: 40.0000 mg | DELAYED_RELEASE_TABLET | Freq: Every day | ORAL | Status: DC | PRN

## 2022-01-15 MED ORDER — METOPROLOL TARTRATE 5 MG/5ML IV SOLN: 5.0000 mg | INTRAVENOUS | Status: DC | PRN

## 2022-01-15 MED ORDER — VANCOMYCIN HCL 2000 MG/400ML IV SOLN
2000.0000 mg | Freq: Once | INTRAVENOUS | Status: AC
  Administered 2022-01-15: 2000 mg via INTRAVENOUS
  Filled 2022-01-15: qty 400

## 2022-01-15 MED ORDER — OXYCODONE HCL 5 MG PO TABS: 5.0000 mg | ORAL_TABLET | ORAL | Status: DC | PRN

## 2022-01-15 MED ORDER — INSULIN DETEMIR 100 UNIT/ML ~~LOC~~ SOLN
8.0000 [IU] | Freq: Every day | SUBCUTANEOUS | Status: DC
  Administered 2022-01-15 – 2022-01-22 (×8): 8 [IU] via SUBCUTANEOUS
  Filled 2022-01-15 (×9): qty 0.08

## 2022-01-15 MED ORDER — ACETAMINOPHEN 325 MG PO TABS
650.0000 mg | ORAL_TABLET | Freq: Four times a day (QID) | ORAL | Status: DC | PRN
Start: 2022-01-15 — End: 2022-01-23
  Administered 2022-01-17 – 2022-01-19 (×5): 650 mg via ORAL
  Filled 2022-01-15 (×5): qty 2

## 2022-01-15 MED ORDER — SIMETHICONE 80 MG PO CHEW
80.0000 mg | CHEWABLE_TABLET | Freq: Four times a day (QID) | ORAL | Status: DC | PRN
Start: 2022-01-15 — End: 2022-01-15

## 2022-01-15 MED ORDER — METRONIDAZOLE 500 MG/100ML IV SOLN
500.0000 mg | Freq: Once | INTRAVENOUS | Status: AC
  Administered 2022-01-15: 500 mg via INTRAVENOUS
  Filled 2022-01-15: qty 100

## 2022-01-15 MED ORDER — METOPROLOL TARTRATE 5 MG/5ML IV SOLN
5.0000 mg | INTRAVENOUS | Status: DC | PRN
  Filled 2022-01-15: qty 5

## 2022-01-15 MED ORDER — PANTOPRAZOLE SODIUM 40 MG PO TBEC
40.0000 mg | DELAYED_RELEASE_TABLET | Freq: Every day | ORAL | Status: DC
  Administered 2022-01-15 – 2022-01-22 (×8): 40 mg via ORAL
  Filled 2022-01-15 (×9): qty 1

## 2022-01-15 MED ORDER — SODIUM CHLORIDE 0.9 % IV SOLN: 2.0000 g | Freq: Once | INTRAVENOUS | Status: DC

## 2022-01-15 MED ORDER — LACTATED RINGERS IV SOLN: INTRAVENOUS | Status: AC

## 2022-01-15 MED ORDER — METRONIDAZOLE 500 MG/100ML IV SOLN
500.0000 mg | Freq: Two times a day (BID) | INTRAVENOUS | Status: DC
  Administered 2022-01-16 – 2022-01-20 (×9): 500 mg via INTRAVENOUS
  Filled 2022-01-15 (×9): qty 100

## 2022-01-15 MED ORDER — BICTEGRAVIR-EMTRICITAB-TENOFOV 50-200-25 MG PO TABS
1.0000 | ORAL_TABLET | Freq: Every day | ORAL | Status: DC
  Administered 2022-01-15 – 2022-01-22 (×8): 1 via ORAL
  Filled 2022-01-15 (×9): qty 1

## 2022-01-15 MED ORDER — OXYCODONE-ACETAMINOPHEN 5-325 MG PO TABS: 1.0000 | ORAL_TABLET | ORAL | Status: DC | PRN

## 2022-01-15 MED ORDER — POLYVINYL ALCOHOL 1.4 % OP SOLN: 1.0000 [drp] | OPHTHALMIC | Status: DC | PRN

## 2022-01-15 MED ORDER — MUSCLE RUB 10-15 % EX CREA
1.0000 | TOPICAL_CREAM | CUTANEOUS | Status: DC | PRN
Start: 2022-01-15 — End: 2022-01-23

## 2022-01-15 MED ORDER — POLYETHYLENE GLYCOL 3350 17 G PO PACK: 17.0000 g | PACK | Freq: Every day | ORAL | Status: DC | PRN

## 2022-01-15 MED ORDER — TRAZODONE HCL 100 MG PO TABS
50.0000 mg | ORAL_TABLET | Freq: Every evening | ORAL | Status: DC | PRN
Start: 2022-01-15 — End: 2022-01-23
  Administered 2022-01-15 – 2022-01-21 (×6): 50 mg via ORAL
  Filled 2022-01-15 (×6): qty 1

## 2022-01-15 MED ORDER — DOCUSATE SODIUM 100 MG PO CAPS
100.0000 mg | ORAL_CAPSULE | Freq: Every day | ORAL | Status: DC
  Administered 2022-01-16 – 2022-01-22 (×5): 100 mg via ORAL
  Filled 2022-01-15 (×6): qty 1

## 2022-01-15 MED ORDER — OXYCODONE HCL 5 MG PO TABS
5.0000 mg | ORAL_TABLET | ORAL | Status: DC | PRN
  Administered 2022-01-15 – 2022-01-23 (×24): 5 mg via ORAL
  Filled 2022-01-15 (×24): qty 1

## 2022-01-15 MED ORDER — SODIUM CHLORIDE 0.9 % IV SOLN
2.0000 g | Freq: Once | INTRAVENOUS | Status: AC
  Administered 2022-01-15: 2 g via INTRAVENOUS
  Filled 2022-01-15: qty 12.5

## 2022-01-15 MED ORDER — HYDROCORTISONE (PERIANAL) 2.5 % EX CREA
1.0000 | TOPICAL_CREAM | Freq: Four times a day (QID) | CUTANEOUS | Status: DC | PRN
Start: 2022-01-15 — End: 2022-01-23

## 2022-01-15 MED ORDER — DEXAMETHASONE 4 MG PO TABS: 4.0000 mg | ORAL_TABLET | Freq: Two times a day (BID) | ORAL | Status: DC

## 2022-01-15 MED ORDER — BISACODYL 5 MG PO TBEC
10.0000 mg | DELAYED_RELEASE_TABLET | Freq: Every day | ORAL | Status: DC | PRN
Start: 2022-01-15 — End: 2022-01-15

## 2022-01-15 MED ORDER — IPRATROPIUM-ALBUTEROL 0.5-2.5 (3) MG/3ML IN SOLN: 3.0000 mL | RESPIRATORY_TRACT | Status: DC | PRN

## 2022-01-15 NOTE — Progress Notes (Signed)
PHARMACY -  BRIEF ANTIBIOTIC NOTE   Pharmacy has received consult(s) for vancomycin and aztreonam from an ED provider.  The patient's profile has been reviewed for ht/wt/allergies/indication/available labs.    Patient has tolerated cephalosporins on multiple occassions in the past. Per consult, aztreonam changed to cefepime.  One time order(s) placed for vancomycin 2 g + cefepime 2 g  Further antibiotics/pharmacy consults should be ordered by admitting physician if indicated.                       Thank you,  Tawnya Crook, PharmD, BCPS Clinical Pharmacist 01/15/2022 2:55 PM

## 2022-01-15 NOTE — Sepsis Progress Note (Signed)
eLink is following this Code Sepsis. °

## 2022-01-15 NOTE — Progress Notes (Signed)
Pharmacy Antibiotic Note  Shannon [name] is a 66 y.o. female admitted on 01/15/2022. Patient with recent infection requiring right AKA. She has also had right forearm I&D. Pharmacy has been consulted for vancomycin and cefepime dosing.  Plan: -Vancomycin 2 g followed by vancomycin 750 mg IV q24h -Cefepime 2 g IV q12h -Continue to follow renal function, cultures, and clinical progress for antibiotic dosage adjustments and de-escalation as indicated.    Temp (24hrs), Avg:98.4 F (36.9 C), Min:98.4 F (36.9 C), Max:98.4 F (36.9 C)  Recent Labs  Lab 01/09/22 1611 01/10/22 0630 01/11/22 0822 01/12/22 1135 01/13/22 0500 01/14/22 1211 01/15/22 1531  WBC 8.9 8.4  --  10.7* 9.3  --  9.2  CREATININE  --  1.05* 1.03* 1.17* 1.06* 1.10* 1.15*  LATICACIDVEN  --   --   --   --   --   --  1.0    Estimated Creatinine Clearance: 52.6 mL/min (A) (by C-G formula based on SCr of 1.15 mg/dL (H)).    Allergies  Allergen Reactions   Lisinopril Swelling and Cough    Face/throat swelling   Tree Extract Swelling and Other (See Comments)    Swelling to eyes   Augmentin [Amoxicillin-Pot Clavulanate] Other (See Comments)    Headache, dizzy   Ciprofloxacin Hives    Antimicrobials this admission: Cefepime 5/25 >> Vancomycin 5/25 >>  Dose adjustments this admission: N/A  Microbiology results: 5/25 BCx: pending   Thank you for allowing pharmacy to be a part of this patient's care.  Tawnya Crook, PharmD, BCPS Clinical Pharmacist 01/15/2022 5:53 PM

## 2022-01-15 NOTE — Telephone Encounter (Signed)
Can you please call and make an appt with Erin for the week of June 6th please. Any time that wee with either Dr. Sharol Given or Junie Panning would be fine.

## 2022-01-15 NOTE — Telephone Encounter (Signed)
Thornton Park from [facility] called requesting a call back. Pt had surgery 5/5 and need a post op appt. Please call [place] at [facility]

## 2022-01-15 NOTE — Consult Note (Signed)
Orthopedic Hand Surgery Consultation:  Reason for Consult: right forearm wound, sanguinous drainage Referring Physician: Dr. Reesa Chew   HPI: Shannon [name] is a(an) 66 y.o. female presents as a new consultation for evaluation of a right volar forearm draining wound.  She previously underwent surgery for possible necrotizing fasciitis/infection/severe swelling to the forearm.  She underwent fasciotomy with fascial biopsy however no evidence of infectious etiology based on the surgical notes.  She now presents with continued drainage from this wound.  There is no erythema and minimal pain or issues with range of motion of the digits.  The patient is here to assess for possible recurrent infection possibly from the right lower extremity above-knee amputation versus right forearm.  OR Cxs: No growth Path: negative fascial biopsy for nec fasc   Physical Exam: Right upper Extremity Suture line to the volar forearm in place and sutures were removed.  Some sanguinous drainage from a 1 x 1 cm opening at the surgical site however no erythema present there is fullness to the volar forearm however the compartments and previously been released and are soft and compressible however there is fullness consistent with hematoma.  This is now mostly consolidated is soft and has some drainage associated.  Distally she has full sensation tact light touch over the median radial and ulnar nerve distributions.  She is able to perform full elbow wrist and finger range of motion full composite fist and full extension of the digits.  Brisk capillary refill to the digits with sensation tact light touch.   Assessment/Plan: 66 year old female readmitted for possible sepsis and worsening of infection hand surgery consulted to rule out possible source from the right upper extremity.  There is no signs of infection based on my clinical exam today.  She does have some sanguinous drainage from the previous wound however no signs of  deep abscess or compartment syndrome or development of new abscess within the hand wrist forearm or elbow to the right upper extremity.  I removed the sutures from the right forearm I cleaned the wound and placed a Adaptic dressing with 4 x 4's ABD pad and loosely applied Kerlix.  Recommend further work-up for source of continued infection  Recommend close monitoring of the right upper extremity for change in symptoms  Recommend daily dressing changes to the right forearm with Adaptic or Xeroform and gauze ABD and loosely applied Kerlix.  This can also be changed as needed if the drainage were to increase.  Plan to allow the wound to granulate by secondary intention.  Please call if any change in symptoms or with any questions regarding this patient.   Izell Concord, MD Orthopaedic Hand Surgeon [facility] Office number: [phone] [address], [address] [place], [place] [address]    Past Medical History:  Diagnosis Date   Anemia    Anxiety    HX PANIC ATTACKS   Arthritis    "starting to; in my hands" (07/09/2015)   Asthma    Atrial fibrillation (HCC)    Atrial flutter, paroxysmal (HCC)    Bloated abdomen    CFS (chronic fatigue syndrome)    Chewing difficulty    Chronic asthma with acute exacerbation    "I have chronic asthma all the time; sometimes exacerbations" (07/09/2015)   Chronic lower back pain    COPD (chronic obstructive pulmonary disease) ([place])    Cyst of right kidney    "3 of them; dx'd in ~ 01/2015"   Dyspnea    GERD (gastroesophageal reflux  disease)    Heart murmur    History of blood transfusion    "related to my brain surgery I think"   History of pulmonary embolism 07/09/2015   History of radiation therapy 04/09/20-04/22/20   SBRT Left Lung, Dr. Gery Pray   History of radiation therapy    lumbar spine 10/08/2021-10/28/2021  Dr Gery Pray   HIV disease [facility])    Hyperlipidemia    Hypertension    Leg edema    Lipodystrophy    Mild CAD 2013    Multiple thyroid nodules    Osteopenia    Palpitations    Pneumonia 07/09/2015   Shingles    Vitamin B 12 deficiency    Vitamin D deficiency     Past Surgical History:  Procedure Laterality Date   ABDOMINAL HYSTERECTOMY     "robotic laparosopic"   AMPUTATION Right 12/26/2021   Procedure: AMPUTATION ABOVE KNEE;  Surgeon: Newt Minion, MD;  Location: [facility];  Service: Orthopedics;  Laterality: Right;   BRAIN SURGERY  1974   "brain tumor; benign; on top of my brain; got a plate in there"   BRAIN SURGERY     age 52- -"Tumor pushing my skullout"   BRONCHIAL BIOPSY  02/13/2020   Procedure: BRONCHIAL BIOPSIES;  Surgeon: [name], MD;  Location: [facility];  Service: Pulmonary;;   BRONCHIAL BRUSHINGS  02/13/2020   Procedure: BRONCHIAL BRUSHINGS;  Surgeon: [name], MD;  Location: [facility] ENDOSCOPY;  Service: Pulmonary;;   BRONCHIAL NEEDLE ASPIRATION BIOPSY  02/13/2020   Procedure: BRONCHIAL NEEDLE ASPIRATION BIOPSIES;  Surgeon: [name], MD;  Location: [facility] ENDOSCOPY;  Service: Pulmonary;;   CARDIAC CATHETERIZATION     FIDUCIAL MARKER PLACEMENT  02/13/2020   Procedure: FIDUCIAL MARKER PLACEMENT;  Surgeon: [name], MD;  Location: [facility];  Service: Pulmonary;;   I & D EXTREMITY Right 12/20/2021   Procedure: IRRIGATION AND DEBRIDEMENT RT. FOREARM;  Surgeon: Iran Planas, MD;  Location: [facility];  Service: Orthopedics;  Laterality: Right;   IRRIGATION AND DEBRIDEMENT FOOT Right 12/17/2021   Procedure: IRRIGATION AND DEBRIDEMENT, RIGHT FOOT AND CALF;  Surgeon: Armond Hang, MD;  Location: [facility];  Service: Orthopedics;  Laterality: Right;   TONSILLECTOMY AND ADENOIDECTOMY     VIDEO BRONCHOSCOPY WITH ENDOBRONCHIAL NAVIGATION N/A 02/13/2020   Procedure: VIDEO BRONCHOSCOPY WITH ENDOBRONCHIAL NAVIGATION;  Surgeon: [name], MD;  Location: [facility];  Service: Pulmonary;  Laterality: N/A;    Family History  Problem Relation Age of Onset   Asthma Mother     Heart failure Mother        cardiomyopathy   Thyroid disease Mother    Heart disease Mother    Obesity Mother    Hypertension Mother    Heart murmur Sister    Heart murmur Brother    Diabetes Sister    Thyroid disease Sister    Heart murmur Sister     Social History:  reports that she has quit smoking. Her smoking use included cigarettes. She has never used smokeless tobacco. She reports that she does not currently use alcohol. She reports that she does not use drugs.  Allergies:  Allergies  Allergen Reactions   Lisinopril Swelling and Cough    Face/throat swelling   Tree Extract Swelling and Other (See Comments)    Swelling to eyes   Augmentin [Amoxicillin-Pot Clavulanate] Other (See Comments)    Headache, dizzy   Ciprofloxacin Hives    Medications: reviewed, no changes to patient's home  medications  Results for orders placed or performed during the hospital encounter of 01/15/22 (from the past 48 hour(s))  Resp Panel by RT-PCR (Flu A&B, Covid) Anterior Nasal Swab     Status: None   Collection Time: 01/15/22  3:31 PM   Specimen: Anterior Nasal Swab  Result Value Ref Range   SARS Coronavirus 2 by RT PCR NEGATIVE NEGATIVE    Comment: (NOTE) SARS-CoV-2 target nucleic acids are NOT DETECTED.  The SARS-CoV-2 RNA is generally detectable in upper respiratory specimens during the acute phase of infection. The lowest concentration of SARS-CoV-2 viral copies this assay can detect is 138 copies/mL. A negative result does not preclude SARS-Cov-2 infection and should not be used as the sole basis for treatment or other patient management decisions. A negative result may occur with  improper specimen collection/handling, submission of specimen other than nasopharyngeal swab, presence of viral mutation(s) within the areas targeted by this assay, and inadequate number of viral copies(<138 copies/mL). A negative result must be combined with clinical observations, patient history, and  epidemiological information. The expected result is Negative.  Fact Sheet for Patients:  EntrepreneurPulse.com.au  Fact Sheet for Healthcare Providers:  IncredibleEmployment.be  This test is no t yet approved or cleared by the [place] FDA and  has been authorized for detection and/or diagnosis of SARS-CoV-2 by FDA under an Emergency Use Authorization (EUA). This EUA will remain  in effect (meaning this test can be used) for the duration of the COVID-19 declaration under Section 564(b)(1) of the Act, 21 U.S.C.section 360bbb-3(b)(1), unless the authorization is terminated  or revoked sooner.       Influenza A by PCR NEGATIVE NEGATIVE   Influenza B by PCR NEGATIVE NEGATIVE    Comment: (NOTE) The Xpert Xpress SARS-CoV-2/FLU/RSV plus assay is intended as an aid in the diagnosis of influenza from Nasopharyngeal swab specimens and should not be used as a sole basis for treatment. Nasal washings and aspirates are unacceptable for Xpert Xpress SARS-CoV-2/FLU/RSV testing.  Fact Sheet for Patients: EntrepreneurPulse.com.au  Fact Sheet for Healthcare Providers: IncredibleEmployment.be  This test is not yet approved or cleared by the [place] FDA and has been authorized for detection and/or diagnosis of SARS-CoV-2 by FDA under an Emergency Use Authorization (EUA). This EUA will remain in effect (meaning this test can be used) for the duration of the COVID-19 declaration under Section 564(b)(1) of the Act, 21 U.S.C. section 360bbb-3(b)(1), unless the authorization is terminated or revoked.  Performed at [facility], [place] [address], [place], [place] [address]   Lactic acid, plasma     Status: None   Collection Time: 01/15/22  3:31 PM  Result Value Ref Range   Lactic Acid, Venous 1.0 0.5 - 1.9 mmol/L    Comment: Performed at [facility], [place] [address]  [place], [place] [address]  Comprehensive metabolic panel     Status: Abnormal   Collection Time: 01/15/22  3:31 PM  Result Value Ref Range   Sodium 145 135 - 145 mmol/L   Potassium 4.9 3.5 - 5.1 mmol/L   Chloride 103 98 - 111 mmol/L   CO2 34 (H) 22 - 32 mmol/L   Glucose, Bld 258 (H) 70 - 99 mg/dL    Comment: Glucose reference range applies only to samples taken after fasting for at least 8 hours.   BUN 50 (H) 8 - 23 mg/dL   Creatinine, Ser 1.15 (H) 0.44 - 1.00 mg/dL   Calcium 10.2 8.9 - 10.3 mg/dL  Total Protein 5.7 (L) 6.5 - 8.1 g/dL   Albumin 2.9 (L) 3.5 - 5.0 g/dL   AST 26 15 - 41 U/L   ALT 44 0 - 44 U/L   Alkaline Phosphatase 84 38 - 126 U/L   Total Bilirubin 0.5 0.3 - 1.2 mg/dL   GFR, Estimated 52 (L) >60 mL/min    Comment: (NOTE) Calculated using the CKD-EPI Creatinine Equation (2021)    Anion gap 8 5 - 15    Comment: Performed at [facility], [place] [address], [place], [place] [address]  CBC with Differential     Status: Abnormal (Preliminary result)   Collection Time: 01/15/22  3:31 PM  Result Value Ref Range   WBC 9.2 4.0 - 10.5 K/uL   RBC 3.19 (L) 3.87 - 5.11 MIL/uL   Hemoglobin 10.1 (L) 12.0 - 15.0 g/dL   HCT 32.9 (L) 36.0 - 46.0 %   MCV 103.1 (H) 80.0 - 100.0 fL   MCH 31.7 26.0 - 34.0 pg   MCHC 30.7 30.0 - 36.0 g/dL   RDW 16.8 (H) 11.5 - 15.5 %   Platelets PENDING 150 - 400 K/uL   nRBC 0.0 0.0 - 0.2 %    Comment: Performed at [facility], [place] [address], [place], [place] [address]   Neutrophils Relative % PENDING %   Neutro Abs PENDING 1.7 - 7.7 K/uL   Band Neutrophils PENDING %   Lymphocytes Relative PENDING %   Lymphs Abs PENDING 0.7 - 4.0 K/uL   Monocytes Relative PENDING %   Monocytes Absolute PENDING 0.1 - 1.0 K/uL   Eosinophils Relative PENDING %   Eosinophils Absolute PENDING 0.0 - 0.5 K/uL   Basophils Relative PENDING %   Basophils Absolute PENDING 0.0 - 0.1 K/uL   WBC Morphology PENDING    RBC Morphology PENDING     Smear Review PENDING    Other PENDING %   nRBC PENDING 0 /100 WBC   Metamyelocytes Relative PENDING %   Myelocytes PENDING %   Promyelocytes Relative PENDING %   Blasts PENDING %   Immature Granulocytes PENDING %   Abs Immature Granulocytes PENDING 0.00 - 0.07 K/uL  Protime-INR     Status: None   Collection Time: 01/15/22  3:31 PM  Result Value Ref Range   Prothrombin Time 14.5 11.4 - 15.2 seconds   INR 1.1 0.8 - 1.2    Comment: (NOTE) INR goal varies based on device and disease states. Performed at [facility], [place] [address], [place], [place] [address]   APTT     Status: None   Collection Time: 01/15/22  3:31 PM  Result Value Ref Range   aPTT 24 24 - 36 seconds    Comment: Performed at [facility], [place] [address], [place],  [address]  Urinalysis, Routine w reflex microscopic Urine, Catheterized     Status: Abnormal   Collection Time: 01/15/22  4:33 PM  Result Value Ref Range   Color, Urine YELLOW YELLOW   APPearance CLEAR CLEAR   Specific Gravity, Urine 1.019 1.005 - 1.030   pH 5.0 5.0 - 8.0   Glucose, UA 50 (A) NEGATIVE mg/dL   Hgb urine dipstick NEGATIVE NEGATIVE   Bilirubin Urine NEGATIVE NEGATIVE   Ketones, ur NEGATIVE NEGATIVE mg/dL   Protein, ur NEGATIVE NEGATIVE mg/dL   Nitrite NEGATIVE NEGATIVE   Leukocytes,Ua NEGATIVE NEGATIVE    Comment: Performed at [address] [place],  [place] [address]   *Note: Due to a large number of results and/or encounters for the requested time period, some results have not been displayed. A complete set of results can be found in Results Review.    DG Forearm Right  Result Date: 01/15/2022 CLINICAL DATA:  Status post right arm irrigation and debridement. Right arm is oozing blood. EXAM: RIGHT FOREARM - 2 VIEW COMPARISON:  CT right forearm 12/19/2021 FINDINGS: Normal bone mineralization. No cortical erosion to indicate radiographic evidence of  acute osteomyelitis. There is moderate proximal volar forearm soft tissue swelling. No subcutaneous air is seen. Mild medial elbow joint space narrowing and peripheral osteophytosis. No acute fracture or dislocation. IMPRESSION: 1. Proximal volar forearm soft tissue swelling. No subcutaneous air. 2. No cortical erosion to indicate radiographic evidence of acute osteomyelitis. Electronically Signed   By: Yvonne Kendall M.D.   On: 01/15/2022 16:23   DG Chest Port 1 View  Result Date: 01/15/2022 CLINICAL DATA:  Sepsis. EXAM: PORTABLE CHEST 1 VIEW COMPARISON:  01/06/2022 FINDINGS: Cardiomegaly again noted. LEFT perihilar opacity with fiducial markers are unchanged. There is no evidence of airspace disease, pleural effusion, pneumothorax or acute bony abnormality. IMPRESSION: Cardiomegaly without evidence of acute cardiopulmonary disease. Electronically Signed   By: Margarette Canada M.D.   On: 01/15/2022 16:25   DG Femur Min 2 Views Right  Result Date: 01/15/2022 CLINICAL DATA:  Recent RIGHT above the knee amputation. Redness at the amputation site reported by family. EXAM: RIGHT FEMUR 2 VIEWS COMPARISON:  None Available. FINDINGS: Limited evaluation is due to limited patient positioning and patient did not allow for further repositioning. RIGHT AKA changes are noted. No acute fracture, dislocation or acute osteomyelitis noted. IMPRESSION: 1. RIGHT AKA changes without acute bony abnormality or acute osteomyelitis. Electronically Signed   By: Margarette Canada M.D.   On: 01/15/2022 16:21    ROS: 14 point review of systems negative except per HPI

## 2022-01-15 NOTE — ED Provider Notes (Signed)
[facility] DEPT Provider Note   CSN: [id number] Arrival date & time: 01/15/22  1333     History  Chief Complaint  Patient presents with   Wound Check    Shannon [name] is a 66 y.o. female.  HPI 5 caveat secondary to patient's ability to give history  66 year old female history of recent necrotizing soft tissue infection required for right above-the-knee amputation, and right forearm I&D discharged to [facility] care yesterday and sent in today with reports that the redness of the leg wound and oozing from her right arm. Reviewed discharge summary notes that patient has HIV disease, morbid obesity, had an abscess of her right foot, has a history of chronic respiratory failure with hypoxia, chronic anticoagulation, adenocarcinoma of the lung     Home Medications Prior to Admission medications   Medication Sig Start Date End Date Taking? Authorizing Provider  A&D OINT Apply 1 application. topically daily as needed (rash).    [provider]  albuterol (PROVENTIL) (2.5 MG/3ML) 0.083% nebulizer solution USE 1 VIAL VIA NEBULIZER EVERY 4 HOURS AS NEEDED FOR WHEEZING OR SHORTNESS OF BREATH Patient taking differently: Take 2.5 mg by nebulization every 6 (six) hours as needed for shortness of breath. 12/02/21   Angelica Pou, MD  Ascorbic Acid (VITAMIN C) 1000 MG tablet Take 1,000 mg by mouth every other day.     [provider]  b complex vitamins tablet Take 1 tablet by mouth daily.    [provider]  bictegravir-emtricitabine-tenofovir AF (BIKTARVY) 50-200-25 MG TABS tablet Take 1 tablet by mouth daily. 01/14/22   Bonnielee Haff, MD  Calcium Citrate-Vitamin D (CALCIUM + D PO) Take 1 tablet by mouth every other day.    [provider]  carvedilol (COREG) 12.5 MG tablet Take 1 tablet (12.5 mg total) by mouth 2 (two) times daily with a meal. 01/14/22   Bonnielee Haff, MD  cyanocobalamin (,VITAMIN B-12,) 1000  MCG/ML injection ADMINISTER 1 ML(1000 MCG) IN THE MUSCLE EVERY 30 DAYS Patient taking differently: Inject 1,000 mcg into the muscle every 30 (thirty) days. 11/12/21   [name], MD  dexamethasone (DECADRON) 4 MG tablet Take 1 tablet (4 mg total) by mouth 2 (two) times daily with a meal. 12/16/21   [name], MD  docusate sodium (COLACE) 100 MG capsule Take 1 capsule (100 mg total) by mouth daily. 01/14/22   Bonnielee Haff, MD  feeding supplement (ENSURE ENLIVE / ENSURE PLUS) LIQD Take 237 mLs by mouth 2 (two) times daily between meals. 01/14/22   Bonnielee Haff, MD  hydrALAZINE (APRESOLINE) 25 MG tablet Take 1 tablet (25 mg total) by mouth 3 (three) times daily. 01/14/22   Bonnielee Haff, MD  insulin detemir (LEVEMIR) 100 UNIT/ML injection Inject 0.08 mLs (8 Units total) into the skin daily. 01/14/22   Bonnielee Haff, MD  Misc. Devices (PULSE OXIMETER FOR FINGER) MISC 1 Units by Does not apply route as needed. 01/19/19   Skeet Latch, MD  mometasone-formoterol [facility]) 100-5 MCG/ACT AERO Inhale 2 puffs into the lungs 2 (two) times daily. 01/14/22   Bonnielee Haff, MD  morphine (MS CONTIN) 15 MG 12 hr tablet Take 1 tablet by mouth every 12 hours. 01/14/22   Bonnielee Haff, MD  Naphazoline HCl (CLEAR EYES OP) Place 1 drop into both eyes daily as needed (redness).    [provider]  neomycin-bacitracin-polymyxin (NEOSPORIN) ointment Apply 1 application. topically daily as needed for wound care.    [provider]  Olopatadine HCl (PATADAY OP) Place 1 drop into both eyes daily as needed (burning).    [provider]  oxyCODONE-acetaminophen (PERCOCET) 5-325 MG tablet Take 1 tablet by mouth every 4 (four) hours as needed for severe pain. 01/14/22 01/14/23  Bonnielee Haff, MD  OXYGEN Inhale 1.5-2 L into the lungs See admin instructions. Uses at bedtime and through the day as needed    [provider]  pantoprazole (PROTONIX) 40 MG tablet Take 1 tablet (40 mg  total) by mouth daily. 01/14/22   Bonnielee Haff, MD  Phenylephrine-Cocoa Butter (PREPARATION H RE) Place 1 application. rectally daily as needed (hemorrhoids).    [provider]  Polyethyl Glycol-Propyl Glycol (SYSTANE OP) Place 1 drop into both eyes daily as needed (dry eyes).    [provider]  polyethylene glycol (MIRALAX / GLYCOLAX) 17 g packet Take 17 g by mouth daily as needed for mild constipation. 01/14/22   Bonnielee Haff, MD  spironolactone (ALDACTONE) 25 MG tablet Take 1 tablet (25 mg total) by mouth daily. 12/16/21 03/16/22  [name], MD  SYRINGE/NEEDLE, DISP, 1 ML (B-D SYRINGE/NEEDLE 1CC/25GX5/8) 25G X 5/8" 1 ML MISC 1 Units by Does not apply route every 30 (thirty) days. 04/14/19   [name], MD  XARELTO 20 MG TABS tablet TAKE 1 TABLET(20 MG) BY MOUTH DAILY Patient taking differently: 20 mg daily. 07/24/21   Skeet Latch, MD      Allergies    Lisinopril, Tree extract, Augmentin [amoxicillin-pot clavulanate], and Ciprofloxacin    Review of Systems   Review of Systems  Physical Exam Updated Vital Signs BP (!) 145/84   Pulse 75   Temp 98.4 F (36.9 C) (Oral)   Resp (!) 21   SpO2 100%  Physical Exam Vitals and nursing note reviewed.  Constitutional:      General: She is not in acute distress.    Appearance: She is obese. She is ill-appearing.  HENT:     Head: Normocephalic.     Right Ear: External ear normal.     Left Ear: External ear normal.     Nose: Nose normal.     Mouth/Throat:     Mouth: Mucous membranes are dry.  Eyes:     Pupils: Pupils are equal, round, and reactive to light.  Cardiovascular:     Rate and Rhythm: Regular rhythm. Tachycardia present.  Pulmonary:     Effort: Respiratory distress present.     Breath sounds: Normal breath sounds.  Abdominal:     General: Abdomen is flat.     Palpations: Abdomen is soft.  Musculoskeletal:     Cervical back: Normal range of motion.     Comments: Right aka- mild erythema  at wound edges, no significant discharge, ttp or warmth Right forearm with redness, swelling, ttp at site with sutures, erythema extending proximally to upper arm  Skin:    General: Skin is warm.     Capillary Refill: Capillary refill takes less than 2 seconds.  Neurological:     General: No focal deficit present.     Mental Status: She is alert.     Cranial Nerves: No cranial nerve deficit.     Motor: No weakness.     Coordination: Coordination normal.           ED Results / Procedures / Treatments   Labs (all labs ordered are listed, but only abnormal results are displayed) Labs Reviewed  COMPREHENSIVE METABOLIC PANEL - Abnormal; Notable for the following components:  Result Value   CO2 34 (*)    Glucose, Bld 258 (*)    BUN 50 (*)    Creatinine, Ser 1.15 (*)    Total Protein 5.7 (*)    Albumin 2.9 (*)    GFR, Estimated 52 (*)    All other components within normal limits  CBC WITH DIFFERENTIAL/PLATELET - Abnormal; Notable for the following components:   RBC 3.19 (*)    Hemoglobin 10.1 (*)    HCT 32.9 (*)    MCV 103.1 (*)    RDW 16.8 (*)    All other components within normal limits  URINALYSIS, ROUTINE W REFLEX MICROSCOPIC - Abnormal; Notable for the following components:   Glucose, UA 50 (*)    All other components within normal limits  RESP PANEL BY RT-PCR (FLU A&B, COVID) ARPGX2  CULTURE, BLOOD (ROUTINE X 2)  CULTURE, BLOOD (ROUTINE X 2)  LACTIC ACID, PLASMA  PROTIME-INR  APTT  LACTIC ACID, PLASMA    EKG None  Radiology DG Forearm Right  Result Date: 01/15/2022 CLINICAL DATA:  Status post right arm irrigation and debridement. Right arm is oozing blood. EXAM: RIGHT FOREARM - 2 VIEW COMPARISON:  CT right forearm 12/19/2021 FINDINGS: Normal bone mineralization. No cortical erosion to indicate radiographic evidence of acute osteomyelitis. There is moderate proximal volar forearm soft tissue swelling. No subcutaneous air is seen. Mild medial elbow joint  space narrowing and peripheral osteophytosis. No acute fracture or dislocation. IMPRESSION: 1. Proximal volar forearm soft tissue swelling. No subcutaneous air. 2. No cortical erosion to indicate radiographic evidence of acute osteomyelitis. Electronically Signed   By: Yvonne Kendall M.D.   On: 01/15/2022 16:23   DG Chest Port 1 View  Result Date: 01/15/2022 CLINICAL DATA:  Sepsis. EXAM: PORTABLE CHEST 1 VIEW COMPARISON:  01/06/2022 FINDINGS: Cardiomegaly again noted. LEFT perihilar opacity with fiducial markers are unchanged. There is no evidence of airspace disease, pleural effusion, pneumothorax or acute bony abnormality. IMPRESSION: Cardiomegaly without evidence of acute cardiopulmonary disease. Electronically Signed   By: Margarette Canada M.D.   On: 01/15/2022 16:25   DG Femur Min 2 Views Right  Result Date: 01/15/2022 CLINICAL DATA:  Recent RIGHT above the knee amputation. Redness at the amputation site reported by family. EXAM: RIGHT FEMUR 2 VIEWS COMPARISON:  None Available. FINDINGS: Limited evaluation is due to limited patient positioning and patient did not allow for further repositioning. RIGHT AKA changes are noted. No acute fracture, dislocation or acute osteomyelitis noted. IMPRESSION: 1. RIGHT AKA changes without acute bony abnormality or acute osteomyelitis. Electronically Signed   By: Margarette Canada M.D.   On: 01/15/2022 16:21    Procedures Procedures    Medications Ordered in ED Medications  lactated ringers infusion ( Intravenous New Bag/Given 01/15/22 1713)  vancomycin (VANCOREADY) IVPB 2000 mg/400 mL (2,000 mg Intravenous New Bag/Given 01/15/22 1635)  lactated ringers bolus 500 mL (0 mLs Intravenous Stopped 01/15/22 1711)  metroNIDAZOLE (FLAGYL) IVPB 500 mg (0 mg Intravenous Stopped 01/15/22 1711)  ceFEPIme (MAXIPIME) 2 g in sodium chloride 0.9 % 100 mL IVPB (0 g Intravenous Stopped 01/15/22 1636)    ED Course/ Medical Decision Making/ A&P Clinical Course as of 01/15/22 1718  Thu Jan 15, 2022  1623 CBC reviewed and interpreted and anemia is noted but unchanged from prior Platelets are pending at this time [DR]  1624 Lactic acid, plasma Lactic acid is reviewed interpreted normal [DR]  1624 Comprehensive metabolic panel(!) Complete metabolic panel reviewed interpreted and the glucose is elevated  258 and BUN and creatinine are elevated but unchanged from prior [DR]    Clinical Course User Index [DR] Pattricia Boss, MD                           Medical Decision Making 66 yo female with ho of recent nsti who was discharged to snf yesterday.  She was sent to the ED for reevaluation due to redness and oozing from wounds.  Patient examined and leg wound appears to be healing well, arm wound with redness and swelling with some warmth and tracking up upper arm of erythema.  Differential diagnosis includes but is not limited to sepsis, localized infection, localized wound reaction with healing, recurrence of necrotizing soft tissue infection Patient initiated as sepsis due to rr,hr, and source.  She was treated with broad spectrum antibiotics. She is afebrile No leukocytosis Lactic normal. On reevaluation after antibiotics and labs, she continues hemodynamically stable with heart rate decreased to 80 5:06 100 cc of fluid.  500 cc of fluid was given due to due to N/A lactic acid and it is normal Discussed care with Dr. Greta Doom on call for hand surgery who will be in to see and evaluate Discussed care with Dr. Reesa Chew, on-call for hospitalist who will see and evaluate for admission Plan admission to hospitalist for ongoing antibiotics and observation of redness to arm Consult to hand surgery for evaluation of arm wound- no evidence of air per radiologist    Amount and/or Complexity of Data Reviewed Labs: ordered. Decision-making details documented in ED Course. Radiology: ordered and independent interpretation performed. Decision-making details documented in ED Course. ECG/medicine  tests: ordered and independent interpretation performed. Decision-making details documented in ED Course.  Risk Prescription drug management. Decision regarding hospitalization.   CRITICAL CARE Performed by: Pattricia Boss Total critical care time: 50 minutes Critical care time was exclusive of separately billable procedures and treating other patients. Critical care was necessary to treat or prevent imminent or life-threatening deterioration. Critical care was time spent personally by me on the following activities: development of treatment plan with patient and/or surrogate as well as nursing, discussions with consultants, evaluation of patient's response to treatment, examination of patient, obtaining history from patient or surrogate, ordering and performing treatments and interventions, ordering and review of laboratory studies, ordering and review of radiographic studies, pulse oximetry and re-evaluation of patient's condition.         Final Clinical Impression(s) / ED Diagnoses Final diagnoses:  Right forearm cellulitis  H/O necrotizing fasciitis    Rx / DC Orders ED Discharge Orders     None         Pattricia Boss, MD 01/15/22 1718

## 2022-01-15 NOTE — ED Triage Notes (Signed)
Pt BIBA from [facility]. Facility reports pt arrived last night from [facility] post right AKA and right arm I&D. Facility reports redness of leg wound and that right arm is oozing blood. Pt endorses some pain in leg and arm but states she does not know why facility sent her. A&Ox2, unsure of baseline.  128/62 HR 90 Temp 98.7 Cbg 209 98% 3L [place] baseline

## 2022-01-15 NOTE — H&P (Signed)
History and Physical    Shannon [name] WHQ:[id number] DOB: [birth year] DOA: 01/15/2022  PCP: [name], MD Patient coming from: [facility]  Chief Complaint: Right upper extremity wound  HPI: Shannon [name] is a 66 y.o. female with medical history significant of metastatic adenocarcinoma of lung, HIV, COPD, CVA, recent right AKA and right upper extremity abscess requiring I&D was sent to [facility] on 01/14/22 for rehab comes back to the ER due to worsening swelling of the right upper extremity and some drainage. Patient had prolonged hospitalization here from 4/26 - 01/14/2022 for necrotizing fasciitis of the right foot requiring AKA on 5/5 also complicated by right forearm infection requiring fasciotomy and biopsy by hand surgery.  At that time.  Course was complicated by subacute CVA, fungemia, hypoxic respiratory failure.  Patient was also on Xarelto at the time of discharge.  She also had a wound VAC which was removed prior to her discharge.  She did complete her antibiotic course in the hospital.  After returning to her facility they noted worsening of right upper extremity swelling with some serosanguineous discharge and swelling therefore sent back to the hospital.  In the ER patient was noted to have right upper extremity swelling, slight tachycardia.  WBC was overall unremarkable.  Orthopedic/hand surgeon; Dr Greta Doom, was consulted by ED provider who will see the patient in the ED today.  She was started on IV vancomycin, cefepime and Flagyl.  When I saw the patient she was comfortable but had significant swelling and some sanguinous discharge through her suture site in the right upper extremity.  Right lower extremity wound looked unremarkable without any evidence of bleeding or active infection.   Review of Systems: As per HPI otherwise 10 point review of systems negative.    Past Medical History:  Diagnosis Date   Anemia    Anxiety    HX PANIC ATTACKS   Arthritis     "starting to; in my hands" (07/09/2015)   Asthma    Atrial fibrillation (HCC)    Atrial flutter, paroxysmal (HCC)    Bloated abdomen    CFS (chronic fatigue syndrome)    Chewing difficulty    Chronic asthma with acute exacerbation    "I have chronic asthma all the time; sometimes exacerbations" (07/09/2015)   Chronic lower back pain    COPD (chronic obstructive pulmonary disease) ([place])    Cyst of right kidney    "3 of them; dx'd in ~ 01/2015"   Dyspnea    GERD (gastroesophageal reflux disease)    Heart murmur    History of blood transfusion    "related to my brain surgery I think"   History of pulmonary embolism 07/09/2015   History of radiation therapy 04/09/20-04/22/20   SBRT Left Lung, Dr. Gery Pray   History of radiation therapy    lumbar spine 10/08/2021-10/28/2021  Dr Gery Pray   HIV disease [facility])    Hyperlipidemia    Hypertension    Leg edema    Lipodystrophy    Mild CAD 2013   Multiple thyroid nodules    Osteopenia    Palpitations    Pneumonia 07/09/2015   Shingles    Vitamin B 12 deficiency    Vitamin D deficiency     Past Surgical History:  Procedure Laterality Date   ABDOMINAL HYSTERECTOMY     "robotic laparosopic"   AMPUTATION Right 12/26/2021   Procedure: AMPUTATION ABOVE KNEE;  Surgeon: Newt Minion, MD;  Location: [place];  Service: Orthopedics;  Laterality: Right;   BRAIN SURGERY  1974   "brain tumor; benign; on top of my brain; got a plate in there"   BRAIN SURGERY     age 17- -"Tumor pushing my skullout"   BRONCHIAL BIOPSY  02/13/2020   Procedure: BRONCHIAL BIOPSIES;  Surgeon: [name], MD;  Location: [place];  Service: Pulmonary;;   BRONCHIAL BRUSHINGS  02/13/2020   Procedure: BRONCHIAL BRUSHINGS;  Surgeon: [name], MD;  Location: [facility] ENDOSCOPY;  Service: Pulmonary;;   BRONCHIAL NEEDLE ASPIRATION BIOPSY  02/13/2020   Procedure: BRONCHIAL NEEDLE ASPIRATION BIOPSIES;  Surgeon: [name], MD;  Location: [place] ENDOSCOPY;   Service: Pulmonary;;   CARDIAC CATHETERIZATION     FIDUCIAL MARKER PLACEMENT  02/13/2020   Procedure: FIDUCIAL MARKER PLACEMENT;  Surgeon: [name], MD;  Location: [place];  Service: Pulmonary;;   I & D EXTREMITY Right 12/20/2021   Procedure: IRRIGATION AND DEBRIDEMENT RT. FOREARM;  Surgeon: Iran Planas, MD;  Location: [facility];  Service: Orthopedics;  Laterality: Right;   IRRIGATION AND DEBRIDEMENT FOOT Right 12/17/2021   Procedure: IRRIGATION AND DEBRIDEMENT, RIGHT FOOT AND CALF;  Surgeon: Armond Hang, MD;  Location: [facility];  Service: Orthopedics;  Laterality: Right;   TONSILLECTOMY AND ADENOIDECTOMY     VIDEO BRONCHOSCOPY WITH ENDOBRONCHIAL NAVIGATION N/A 02/13/2020   Procedure: VIDEO BRONCHOSCOPY WITH ENDOBRONCHIAL NAVIGATION;  Surgeon: [name], MD;  Location: [place] ENDOSCOPY;  Service: Pulmonary;  Laterality: N/A;    SOCIAL HISTORY:  reports that she has quit smoking. Her smoking use included cigarettes. She has never used smokeless tobacco. She reports that she does not currently use alcohol. She reports that she does not use drugs.  Allergies  Allergen Reactions   Lisinopril Swelling and Cough    Face/throat swelling   Tree Extract Swelling and Other (See Comments)    Swelling to eyes   Augmentin [Amoxicillin-Pot Clavulanate] Other (See Comments)    Headache, dizzy   Ciprofloxacin Hives    FAMILY HISTORY: Family History  Problem Relation Age of Onset   Asthma Mother    Heart failure Mother        cardiomyopathy   Thyroid disease Mother    Heart disease Mother    Obesity Mother    Hypertension Mother    Heart murmur Sister    Heart murmur Brother    Diabetes Sister    Thyroid disease Sister    Heart murmur Sister      Prior to Admission medications   Medication Sig Start Date End Date Taking? Authorizing Provider  A&D OINT Apply 1 application. topically daily as needed (rash).    [provider]  albuterol (PROVENTIL) (2.5 MG/3ML)  0.083% nebulizer solution USE 1 VIAL VIA NEBULIZER EVERY 4 HOURS AS NEEDED FOR WHEEZING OR SHORTNESS OF BREATH Patient taking differently: Take 2.5 mg by nebulization every 6 (six) hours as needed for shortness of breath. 12/02/21   Angelica Pou, MD  Ascorbic Acid (VITAMIN C) 1000 MG tablet Take 1,000 mg by mouth every other day.     [provider]  b complex vitamins tablet Take 1 tablet by mouth daily.    [provider]  bictegravir-emtricitabine-tenofovir AF (BIKTARVY) 50-200-25 MG TABS tablet Take 1 tablet by mouth daily. 01/14/22   Bonnielee Haff, MD  Calcium Citrate-Vitamin D (CALCIUM + D PO) Take 1 tablet by mouth every other day.    [provider]  carvedilol (COREG) 12.5 MG tablet Take 1 tablet (  12.5 mg total) by mouth 2 (two) times daily with a meal. 01/14/22   Bonnielee Haff, MD  cyanocobalamin (,VITAMIN B-12,) 1000 MCG/ML injection ADMINISTER 1 ML(1000 MCG) IN THE MUSCLE EVERY 30 DAYS Patient taking differently: Inject 1,000 mcg into the muscle every 30 (thirty) days. 11/12/21   [name], MD  dexamethasone (DECADRON) 4 MG tablet Take 1 tablet (4 mg total) by mouth 2 (two) times daily with a meal. 12/16/21   [name], MD  docusate sodium (COLACE) 100 MG capsule Take 1 capsule (100 mg total) by mouth daily. 01/14/22   Bonnielee Haff, MD  feeding supplement (ENSURE ENLIVE / ENSURE PLUS) LIQD Take 237 mLs by mouth 2 (two) times daily between meals. 01/14/22   Bonnielee Haff, MD  hydrALAZINE (APRESOLINE) 25 MG tablet Take 1 tablet (25 mg total) by mouth 3 (three) times daily. 01/14/22   Bonnielee Haff, MD  insulin detemir (LEVEMIR) 100 UNIT/ML injection Inject 0.08 mLs (8 Units total) into the skin daily. 01/14/22   Bonnielee Haff, MD  Misc. Devices (PULSE OXIMETER FOR FINGER) MISC 1 Units by Does not apply route as needed. 01/19/19   Skeet Latch, MD  mometasone-formoterol [facility]) 100-5 MCG/ACT AERO Inhale 2 puffs into the lungs 2 (two)  times daily. 01/14/22   Bonnielee Haff, MD  morphine (MS CONTIN) 15 MG 12 hr tablet Take 1 tablet by mouth every 12 hours. 01/14/22   Bonnielee Haff, MD  Naphazoline HCl (CLEAR EYES OP) Place 1 drop into both eyes daily as needed (redness).    [provider]  neomycin-bacitracin-polymyxin (NEOSPORIN) ointment Apply 1 application. topically daily as needed for wound care.    [provider]  Olopatadine HCl (PATADAY OP) Place 1 drop into both eyes daily as needed (burning).    [provider]  oxyCODONE-acetaminophen (PERCOCET) 5-325 MG tablet Take 1 tablet by mouth every 4 (four) hours as needed for severe pain. 01/14/22 01/14/23  Bonnielee Haff, MD  OXYGEN Inhale 1.5-2 L into the lungs See admin instructions. Uses at bedtime and through the day as needed    [provider]  pantoprazole (PROTONIX) 40 MG tablet Take 1 tablet (40 mg total) by mouth daily. 01/14/22   Bonnielee Haff, MD  Phenylephrine-Cocoa Butter (PREPARATION H RE) Place 1 application. rectally daily as needed (hemorrhoids).    [provider]  Polyethyl Glycol-Propyl Glycol (SYSTANE OP) Place 1 drop into both eyes daily as needed (dry eyes).    [provider]  polyethylene glycol (MIRALAX / GLYCOLAX) 17 g packet Take 17 g by mouth daily as needed for mild constipation. 01/14/22   Bonnielee Haff, MD  spironolactone (ALDACTONE) 25 MG tablet Take 1 tablet (25 mg total) by mouth daily. 12/16/21 03/16/22  [name], MD  SYRINGE/NEEDLE, DISP, 1 ML (B-D SYRINGE/NEEDLE 1CC/25GX5/8) 25G X 5/8" 1 ML MISC 1 Units by Does not apply route every 30 (thirty) days. 04/14/19   [name], MD  XARELTO 20 MG TABS tablet TAKE 1 TABLET(20 MG) BY MOUTH DAILY Patient taking differently: 20 mg daily. 07/24/21   Skeet Latch, MD    Physical Exam: Vitals:   01/15/22 1403 01/15/22 1430 01/15/22 1615 01/15/22 1700  BP:  136/82 (!) 145/84 (!) 92/47  Pulse:  86 75 73  Resp: (!) 28 19 (!) 21  (!) 24  Temp:      TempSrc:      SpO2:  96% 100% 100%      Constitutional: Not in acute distress, appears  chronically ill Respiratory: Clear to auscultation bilaterally Cardiovascular: Normal sinus rhythm, no rubs Abdomen: Nontender nondistended good bowel sounds Musculoskeletal: No edema noted Skin: Right upper extremity swelling and mild sanguinous discharge between her sutures, warmth and erythema tracking closer to her armpit.  Right lower extremity wound looks unremarkable.  Right lower extremity AKA. Neurologic: CN 2-12 grossly intact.  And nonfocal Psychiatric: Normal judgment and insight. Alert and oriented x 3. Normal mood.       Labs on Admission: I have personally reviewed following labs and imaging studies  CBC: Recent Labs  Lab 01/09/22 1611 01/10/22 0630 01/12/22 1135 01/13/22 0500 01/15/22 1531  WBC 8.9 8.4 10.7* 9.3 9.2  NEUTROABS  --   --   --   --  PENDING  HGB 9.6* 9.0* 10.0* 9.4* 10.1*  HCT 31.7* 29.3* 31.9* 30.1* 32.9*  MCV 102.3* 101.7* 100.9* 101.3* 103.1*  PLT 182 163 139* 125* PENDING   Basic Metabolic Panel: Recent Labs  Lab 01/11/22 0822 01/12/22 1135 01/13/22 0500 01/14/22 1211 01/15/22 1531  NA 139 142 142 144 145  K 4.5 4.7 4.5 4.6 4.9  CL 96* 100 99 102 103  CO2 40* 36* 36* 35* 34*  GLUCOSE 147* 152* 164* 171* 258*  BUN 53* 55* 59* 52* 50*  CREATININE 1.03* 1.17* 1.06* 1.10* 1.15*  CALCIUM 10.3 10.5* 10.3 10.2 10.2  MG  --   --   --  1.9  --    GFR: Estimated Creatinine Clearance: 52.6 mL/min (A) (by C-G formula based on SCr of 1.15 mg/dL (H)). Liver Function Tests: Recent Labs  Lab 01/10/22 0630 01/11/22 0822 01/12/22 1135 01/13/22 0500 01/15/22 1531  AST 13* 14* 19 17 26   ALT 26 25 31  38 44  ALKPHOS 80 72 80 79 84  BILITOT 0.4 0.6 0.7 0.9 0.5  PROT 5.1* 5.3* 5.3* 5.2* 5.7*  ALBUMIN 2.6* 2.7* 2.7* 2.7* 2.9*   No results for input(s): LIPASE, AMYLASE in the last 168 hours. No results for input(s): AMMONIA in the  last 168 hours. Coagulation Profile: Recent Labs  Lab 01/15/22 1531  INR 1.1   Cardiac Enzymes: No results for input(s): CKTOTAL, CKMB, CKMBINDEX, TROPONINI in the last 168 hours. BNP (last 3 results) No results for input(s): PROBNP in the last 8760 hours. HbA1C: No results for input(s): HGBA1C in the last 72 hours. CBG: Recent Labs  Lab 01/13/22 1214 01/13/22 1618 01/13/22 2114 01/14/22 0805 01/14/22 1144  GLUCAP 186* 153* 185* 121* 206*   Lipid Profile: No results for input(s): CHOL, HDL, LDLCALC, TRIG, CHOLHDL, LDLDIRECT in the last 72 hours. Thyroid Function Tests: No results for input(s): TSH, T4TOTAL, FREET4, T3FREE, THYROIDAB in the last 72 hours. Anemia Panel: No results for input(s): VITAMINB12, FOLATE, FERRITIN, TIBC, IRON, RETICCTPCT in the last 72 hours. Urine analysis:    Component Value Date/Time   COLORURINE YELLOW 01/15/2022 [place] 01/15/2022 1633   APPEARANCEUR Turbid (A) 08/19/2021 1540   LABSPEC 1.019 01/15/2022 1633   PHURINE 5.0 01/15/2022 1633   GLUCOSEU 50 (A) 01/15/2022 1633   HGBUR NEGATIVE 01/15/2022 1633   BILIRUBINUR NEGATIVE 01/15/2022 1633   BILIRUBINUR Negative 08/19/2021 1540   KETONESUR NEGATIVE 01/15/2022 1633   PROTEINUR NEGATIVE 01/15/2022 1633   UROBILINOGEN 0.2 02/27/2021 1533   NITRITE NEGATIVE 01/15/2022 1633   LEUKOCYTESUR NEGATIVE 01/15/2022 1633   Sepsis Labs: !!!!!!!!!!!!!!!!!!!!!!!!!!!!!!!!!!!!!!!!!!!! @LABRCNTIP (procalcitonin:4,lacticidven:4) ) Recent Results (from the past 240 hour(s))  Resp Panel by RT-PCR (Flu A&B, Covid) Anterior Nasal Swab  Status: None   Collection Time: 01/15/22  3:31 PM   Specimen: Anterior Nasal Swab  Result Value Ref Range Status   SARS Coronavirus 2 by RT PCR NEGATIVE NEGATIVE Final    Comment: (NOTE) SARS-CoV-2 target nucleic acids are NOT DETECTED.  The SARS-CoV-2 RNA is generally detectable in upper respiratory specimens during the acute phase of infection.  The lowest concentration of SARS-CoV-2 viral copies this assay can detect is 138 copies/mL. A negative result does not preclude SARS-Cov-2 infection and should not be used as the sole basis for treatment or other patient management decisions. A negative result may occur with  improper specimen collection/handling, submission of specimen other than nasopharyngeal swab, presence of viral mutation(s) within the areas targeted by this assay, and inadequate number of viral copies(<138 copies/mL). A negative result must be combined with clinical observations, patient history, and epidemiological information. The expected result is Negative.  Fact Sheet for Patients:  EntrepreneurPulse.com.au  Fact Sheet for Healthcare Providers:  IncredibleEmployment.be  This test is no t yet approved or cleared by the [place] FDA and  has been authorized for detection and/or diagnosis of SARS-CoV-2 by FDA under an Emergency Use Authorization (EUA). This EUA will remain  in effect (meaning this test can be used) for the duration of the COVID-19 declaration under Section 564(b)(1) of the Act, 21 U.S.C.section 360bbb-3(b)(1), unless the authorization is terminated  or revoked sooner.       Influenza A by PCR NEGATIVE NEGATIVE Final   Influenza B by PCR NEGATIVE NEGATIVE Final    Comment: (NOTE) The Xpert Xpress SARS-CoV-2/FLU/RSV plus assay is intended as an aid in the diagnosis of influenza from Nasopharyngeal swab specimens and should not be used as a sole basis for treatment. Nasal washings and aspirates are unacceptable for Xpert Xpress SARS-CoV-2/FLU/RSV testing.  Fact Sheet for Patients: EntrepreneurPulse.com.au  Fact Sheet for Healthcare Providers: IncredibleEmployment.be  This test is not yet approved or cleared by the [place] FDA and has been authorized for detection and/or diagnosis of SARS-CoV-2 by FDA  under an Emergency Use Authorization (EUA). This EUA will remain in effect (meaning this test can be used) for the duration of the COVID-19 declaration under Section 564(b)(1) of the Act, 21 U.S.C. section 360bbb-3(b)(1), unless the authorization is terminated or revoked.  Performed at [facility], [address], [place], [place] [address]      Radiological Exams on Admission: DG Forearm Right  Result Date: 01/15/2022 CLINICAL DATA:  Status post right arm irrigation and debridement. Right arm is oozing blood. EXAM: RIGHT FOREARM - 2 VIEW COMPARISON:  CT right forearm 12/19/2021 FINDINGS: Normal bone mineralization. No cortical erosion to indicate radiographic evidence of acute osteomyelitis. There is moderate proximal volar forearm soft tissue swelling. No subcutaneous air is seen. Mild medial elbow joint space narrowing and peripheral osteophytosis. No acute fracture or dislocation. IMPRESSION: 1. Proximal volar forearm soft tissue swelling. No subcutaneous air. 2. No cortical erosion to indicate radiographic evidence of acute osteomyelitis. Electronically Signed   By: Yvonne Kendall M.D.   On: 01/15/2022 16:23   DG Chest Port 1 View  Result Date: 01/15/2022 CLINICAL DATA:  Sepsis. EXAM: PORTABLE CHEST 1 VIEW COMPARISON:  01/06/2022 FINDINGS: Cardiomegaly again noted. LEFT perihilar opacity with fiducial markers are unchanged. There is no evidence of airspace disease, pleural effusion, pneumothorax or acute bony abnormality. IMPRESSION: Cardiomegaly without evidence of acute cardiopulmonary disease. Electronically Signed   By: Margarette Canada M.D.   On: 01/15/2022 16:25  DG Femur Min 2 Views Right  Result Date: 01/15/2022 CLINICAL DATA:  Recent RIGHT above the knee amputation. Redness at the amputation site reported by family. EXAM: RIGHT FEMUR 2 VIEWS COMPARISON:  None Available. FINDINGS: Limited evaluation is due to limited patient positioning and patient did not allow for  further repositioning. RIGHT AKA changes are noted. No acute fracture, dislocation or acute osteomyelitis noted. IMPRESSION: 1. RIGHT AKA changes without acute bony abnormality or acute osteomyelitis. Electronically Signed   By: Margarette Canada M.D.   On: 01/15/2022 16:21     All images have been reviewed by me personally.    Assessment/Plan Principal Problem:   Cellulitis of right arm Active Problems:   HIV disease (HCC)   Uncontrolled hypertension   GERD (gastroesophageal reflux disease)   Chronic kidney disease (CKD), stage III (moderate) (HCC)   CAD (coronary artery disease)   Atrial flutter (HCC)   (HFpEF) heart failure with preserved ejection fraction (HCC)   Malignant neoplasm metastatic to bone (HCC)   Right upper extremity cellulitis with concerns of serosanguineous/purulent discharge - Cultures ordered.  Started on broad-spectrum antibiotics-IV vancomycin, cefepime and Flagyl.  Dr. Greta Doom from orthopedic consulted will see the patient today.  In the meantime we will leave the wound area open thereafter further dressing per their recommendations.  Xarelto is on hold in case if she needs surgical intervention. -Recently underwent fascia biopsy and fasciotomy which showed fibroadipose tissue.  Right foot abscess with necrotizing fasciitis status post AKA - AKA performed 5/5.  Completed antibiotic course for this.  Wound VAC was removed and was supposed to follow-up with Dr. Sharol Given in 1 week.  Recent CVA of right anterior temporal lobe - Was on Xarelto outpatient.  Neurology recommended repeat MRI brain on 02/01/2022.  HIV disease - CD4 count 68.  On Biktarvy.  Diabetes mellitus type 2, insulin-dependent - A1c 7.3 on 12/17/2021.  On Decadron.  Continue Levemir, sliding scale and Accu-Cheks.  CKD stage IIb - Creatinine around baseline of 1.1.  Essential hypertension -Home antihypertensives on hold except Coreg due to soft blood pressure.  Congestive heart failure with preserved  ejection fraction - Continue Coreg.  Gentle hydration due to signs of clinical dehydration.  Monitor her volume status.  Aldactone, hydralazine on hold  History of atrial flutter -Xarelto on hold.  Continue Coreg.  GERD - PPI  Morbid obesity with BMI greater than 35 - Recently seen by dietitian  COPD -as needed bronchodilators.  Probably also has OSA, will order CPAP at bedtime  Recent fungemia - Completed Diflucan on 5/15.  Seen by ID and ophthalmology.   DVT prophylaxis: None until cleared by orthopedic surgery Code Status: Full code Family Communication: None Consults called: Orthopedic, Dr. Greta Doom called by ED provider.  Patient will be seen today in the evening Admission status:   Status is: Observation The patient remains OBS appropriate and will d/c before 2 midnights.   Time Spent: 65 minutes.  >50% of the time was devoted to discussing the patients care, assessment, plan and disposition with other care givers along with counseling the patient about the risks and benefits of treatment.    Zury Fazzino Arsenio Loader MD [facility]  If 7PM-7AM, please contact night-coverage   01/15/2022, 5:34 PM

## 2022-01-16 ENCOUNTER — Other Ambulatory Visit: Payer: Self-pay

## 2022-01-16 ENCOUNTER — Encounter (HOSPITAL_COMMUNITY): Payer: Self-pay | Admitting: Internal Medicine

## 2022-01-16 DIAGNOSIS — L7632 Postprocedural hematoma of skin and subcutaneous tissue following other procedure: Secondary | ICD-10-CM | POA: Diagnosis present

## 2022-01-16 DIAGNOSIS — T8781 Dehiscence of amputation stump: Secondary | ICD-10-CM | POA: Diagnosis not present

## 2022-01-16 DIAGNOSIS — I5032 Chronic diastolic (congestive) heart failure: Secondary | ICD-10-CM | POA: Diagnosis not present

## 2022-01-16 DIAGNOSIS — I5033 Acute on chronic diastolic (congestive) heart failure: Secondary | ICD-10-CM | POA: Diagnosis not present

## 2022-01-16 DIAGNOSIS — Z8739 Personal history of other diseases of the musculoskeletal system and connective tissue: Secondary | ICD-10-CM | POA: Diagnosis not present

## 2022-01-16 DIAGNOSIS — T8149XA Infection following a procedure, other surgical site, initial encounter: Secondary | ICD-10-CM | POA: Diagnosis not present

## 2022-01-16 DIAGNOSIS — C7951 Secondary malignant neoplasm of bone: Secondary | ICD-10-CM | POA: Diagnosis not present

## 2022-01-16 DIAGNOSIS — N183 Chronic kidney disease, stage 3 unspecified: Secondary | ICD-10-CM

## 2022-01-16 DIAGNOSIS — Z1159 Encounter for screening for other viral diseases: Secondary | ICD-10-CM | POA: Diagnosis not present

## 2022-01-16 DIAGNOSIS — E118 Type 2 diabetes mellitus with unspecified complications: Secondary | ICD-10-CM | POA: Diagnosis not present

## 2022-01-16 DIAGNOSIS — I639 Cerebral infarction, unspecified: Secondary | ICD-10-CM | POA: Diagnosis not present

## 2022-01-16 DIAGNOSIS — F05 Delirium due to known physiological condition: Secondary | ICD-10-CM | POA: Diagnosis present

## 2022-01-16 DIAGNOSIS — J9611 Chronic respiratory failure with hypoxia: Secondary | ICD-10-CM | POA: Diagnosis not present

## 2022-01-16 DIAGNOSIS — E46 Unspecified protein-calorie malnutrition: Secondary | ICD-10-CM | POA: Diagnosis not present

## 2022-01-16 DIAGNOSIS — I130 Hypertensive heart and chronic kidney disease with heart failure and stage 1 through stage 4 chronic kidney disease, or unspecified chronic kidney disease: Secondary | ICD-10-CM | POA: Diagnosis not present

## 2022-01-16 DIAGNOSIS — J45909 Unspecified asthma, uncomplicated: Secondary | ICD-10-CM | POA: Diagnosis not present

## 2022-01-16 DIAGNOSIS — Z1152 Encounter for screening for COVID-19: Secondary | ICD-10-CM | POA: Diagnosis not present

## 2022-01-16 DIAGNOSIS — I4729 Other ventricular tachycardia: Secondary | ICD-10-CM | POA: Diagnosis not present

## 2022-01-16 DIAGNOSIS — Z4781 Encounter for orthopedic aftercare following surgical amputation: Secondary | ICD-10-CM | POA: Diagnosis not present

## 2022-01-16 DIAGNOSIS — K219 Gastro-esophageal reflux disease without esophagitis: Secondary | ICD-10-CM | POA: Diagnosis not present

## 2022-01-16 DIAGNOSIS — E1122 Type 2 diabetes mellitus with diabetic chronic kidney disease: Secondary | ICD-10-CM | POA: Diagnosis not present

## 2022-01-16 DIAGNOSIS — L0390 Cellulitis, unspecified: Secondary | ICD-10-CM | POA: Diagnosis not present

## 2022-01-16 DIAGNOSIS — R911 Solitary pulmonary nodule: Secondary | ICD-10-CM | POA: Diagnosis not present

## 2022-01-16 DIAGNOSIS — R269 Unspecified abnormalities of gait and mobility: Secondary | ICD-10-CM | POA: Diagnosis not present

## 2022-01-16 DIAGNOSIS — L03115 Cellulitis of right lower limb: Secondary | ICD-10-CM | POA: Diagnosis not present

## 2022-01-16 DIAGNOSIS — I483 Typical atrial flutter: Secondary | ICD-10-CM | POA: Diagnosis not present

## 2022-01-16 DIAGNOSIS — Z6834 Body mass index (BMI) 34.0-34.9, adult: Secondary | ICD-10-CM | POA: Diagnosis not present

## 2022-01-16 DIAGNOSIS — D696 Thrombocytopenia, unspecified: Secondary | ICD-10-CM | POA: Diagnosis not present

## 2022-01-16 DIAGNOSIS — Z7401 Bed confinement status: Secondary | ICD-10-CM | POA: Diagnosis not present

## 2022-01-16 DIAGNOSIS — E559 Vitamin D deficiency, unspecified: Secondary | ICD-10-CM | POA: Diagnosis not present

## 2022-01-16 DIAGNOSIS — T8149XD Infection following a procedure, other surgical site, subsequent encounter: Secondary | ICD-10-CM | POA: Diagnosis not present

## 2022-01-16 DIAGNOSIS — Z89611 Acquired absence of right leg above knee: Secondary | ICD-10-CM | POA: Diagnosis not present

## 2022-01-16 DIAGNOSIS — J441 Chronic obstructive pulmonary disease with (acute) exacerbation: Secondary | ICD-10-CM | POA: Diagnosis not present

## 2022-01-16 DIAGNOSIS — Z794 Long term (current) use of insulin: Secondary | ICD-10-CM | POA: Diagnosis not present

## 2022-01-16 DIAGNOSIS — G4733 Obstructive sleep apnea (adult) (pediatric): Secondary | ICD-10-CM | POA: Diagnosis present

## 2022-01-16 DIAGNOSIS — T8743 Infection of amputation stump, right lower extremity: Secondary | ICD-10-CM | POA: Diagnosis not present

## 2022-01-16 DIAGNOSIS — I4821 Permanent atrial fibrillation: Secondary | ICD-10-CM | POA: Diagnosis not present

## 2022-01-16 DIAGNOSIS — I10 Essential (primary) hypertension: Secondary | ICD-10-CM

## 2022-01-16 DIAGNOSIS — D539 Nutritional anemia, unspecified: Secondary | ICD-10-CM | POA: Diagnosis not present

## 2022-01-16 DIAGNOSIS — B20 Human immunodeficiency virus [HIV] disease: Secondary | ICD-10-CM | POA: Diagnosis present

## 2022-01-16 DIAGNOSIS — N1832 Chronic kidney disease, stage 3b: Secondary | ICD-10-CM | POA: Diagnosis not present

## 2022-01-16 DIAGNOSIS — J449 Chronic obstructive pulmonary disease, unspecified: Secondary | ICD-10-CM | POA: Diagnosis not present

## 2022-01-16 DIAGNOSIS — I471 Supraventricular tachycardia: Secondary | ICD-10-CM | POA: Diagnosis not present

## 2022-01-16 DIAGNOSIS — M6281 Muscle weakness (generalized): Secondary | ICD-10-CM | POA: Diagnosis not present

## 2022-01-16 DIAGNOSIS — I2510 Atherosclerotic heart disease of native coronary artery without angina pectoris: Secondary | ICD-10-CM | POA: Diagnosis present

## 2022-01-16 DIAGNOSIS — I4892 Unspecified atrial flutter: Secondary | ICD-10-CM | POA: Diagnosis present

## 2022-01-16 DIAGNOSIS — G4700 Insomnia, unspecified: Secondary | ICD-10-CM | POA: Diagnosis not present

## 2022-01-16 DIAGNOSIS — C3490 Malignant neoplasm of unspecified part of unspecified bronchus or lung: Secondary | ICD-10-CM | POA: Diagnosis not present

## 2022-01-16 DIAGNOSIS — R531 Weakness: Secondary | ICD-10-CM | POA: Diagnosis not present

## 2022-01-16 DIAGNOSIS — Y835 Amputation of limb(s) as the cause of abnormal reaction of the patient, or of later complication, without mention of misadventure at the time of the procedure: Secondary | ICD-10-CM | POA: Diagnosis not present

## 2022-01-16 DIAGNOSIS — L03113 Cellulitis of right upper limb: Secondary | ICD-10-CM | POA: Diagnosis not present

## 2022-01-16 LAB — CBC
HCT: 30.2 % — ABNORMAL LOW (ref 36.0–46.0)
Hemoglobin: 9.4 g/dL — ABNORMAL LOW (ref 12.0–15.0)
MCH: 32.4 pg (ref 26.0–34.0)
MCHC: 31.1 g/dL (ref 30.0–36.0)
MCV: 104.1 fL — ABNORMAL HIGH (ref 80.0–100.0)
Platelets: 89 10*3/uL — ABNORMAL LOW (ref 150–400)
RBC: 2.9 MIL/uL — ABNORMAL LOW (ref 3.87–5.11)
RDW: 16.5 % — ABNORMAL HIGH (ref 11.5–15.5)
WBC: 7.5 10*3/uL (ref 4.0–10.5)
nRBC: 0 % (ref 0.0–0.2)

## 2022-01-16 LAB — GLUCOSE, CAPILLARY
Glucose-Capillary: 89 mg/dL (ref 70–99)
Glucose-Capillary: 86 mg/dL (ref 70–99)
Glucose-Capillary: 112 mg/dL — ABNORMAL HIGH (ref 70–99)
Glucose-Capillary: 264 mg/dL — ABNORMAL HIGH (ref 70–99)

## 2022-01-16 LAB — BASIC METABOLIC PANEL
Anion gap: 3 — ABNORMAL LOW (ref 5–15)
BUN: 41 mg/dL — ABNORMAL HIGH (ref 8–23)
CO2: 34 mmol/L — ABNORMAL HIGH (ref 22–32)
Calcium: 9.3 mg/dL (ref 8.9–10.3)
Chloride: 106 mmol/L (ref 98–111)
Creatinine, Ser: 0.87 mg/dL (ref 0.44–1.00)
GFR, Estimated: >60 mL/min (ref >60)
Glucose, Bld: 94 mg/dL (ref 70–99)
Potassium: 4.3 mmol/L (ref 3.5–5.1)
Sodium: 143 mmol/L (ref 135–145)

## 2022-01-16 LAB — MAGNESIUM: Magnesium: 2 mg/dL (ref 1.7–2.4)

## 2022-01-16 MED ORDER — ENOXAPARIN SODIUM 40 MG/0.4ML IJ SOSY
40.0000 mg | PREFILLED_SYRINGE | INTRAMUSCULAR | Status: DC
  Administered 2022-01-16 – 2022-01-17 (×2): 40 mg via SUBCUTANEOUS
  Filled 2022-01-16 (×2): qty 0.4

## 2022-01-16 NOTE — Progress Notes (Signed)
PROGRESS NOTE    SOLSTICE Shannon [name]  QIH:[id number] DOB: 08-30-1955 DOA: 01/15/2022 PCP: Shannon Falcon, MD   Brief Narrative:  Shannon [name] is a 66 y.o. female with medical history significant of metastatic adenocarcinoma of lung, HIV, COPD, CVA, recent right AKA and right upper extremity abscess requiring I&D was sent to [facility] on 01/14/22 for rehab comes back to the ER due to worsening swelling of the right upper extremity and some drainage. Patient had prolonged hospitalization here from 4/26 - 01/14/2022 for necrotizing fasciitis of the right foot requiring AKA on 5/5 also complicated by right forearm infection requiring fasciotomy and biopsy by hand surgery.  At that time.  Course was complicated by subacute CVA, fungemia, hypoxic respiratory failure.  Patient was also on Xarelto at the time of discharge.  She also had a wound VAC which was removed prior to her discharge.  She did complete her antibiotic course in the hospital.   After returning to her facility they noted worsening of right upper extremity swelling with some serosanguineous discharge and swelling therefore sent back to the hospital.   In the ER patient was noted to have right upper extremity swelling, slight tachycardia.  WBC was overall unremarkable.  Orthopedic/hand surgeon; Dr Shannon [name], was consulted by ED provider who will see the patient in the ED today.  She was started on IV vancomycin, cefepime and Flagyl.  Pt was seen by ortho who did not feel an abscess  was developing, admitted by hospitalist for further tx     Assessment & Plan:   Principal Problem:   Cellulitis of right arm Active Problems:   HIV disease ([place])   Uncontrolled hypertension   GERD (gastroesophageal reflux disease)   Chronic kidney disease (CKD), stage III (moderate) (HCC)   CAD (coronary artery disease)   Atrial flutter (HCC)   (HFpEF) heart failure with preserved ejection fraction ([place])   Malignant neoplasm metastatic to bone  (HCC)   Cellulitis  Right upper extremity cellulitis with concerns of serosanguineous/purulent discharge - Cultures ordered.  Started on broad-spectrum antibiotics-IV vancomycin, cefepime and Flagyl.  Orthopedic consulted and saw pt in ER.   Xarelto will be held another day to make sure draining wound remains stable in case she needs surgical intervention. -Recently underwent fascia biopsy and fasciotomy which showed fibroadipose tissue.   Right foot abscess with necrotizing fasciitis status post AKA - AKA performed 5/5.  Completed antibiotic course for this.  Wound VAC was removed and was supposed to follow-up with Dr. Sharol Given in 1 week.. No dehis noted   Recent CVA of right anterior temporal lobe - Was on Xarelto outpatient.  Neurology recommended repeat MRI brain on 02/01/2022.   HIV disease - CD4 count 68.  On Biktarvy.   Diabetes mellitus type 2, insulin-dependent - A1c 7.3 on 12/17/2021.  On Decadron.  Continue Levemir, sliding scale and Accu-Cheks.   CKD stage IIb - Creatinine around baseline, at 0.87   Essential hypertension -Home antihypertensives on hold except Coreg due to soft blood pressure.   Congestive heart failure with preserved ejection fraction - Continue Coreg.  Gentle hydration due to signs of clinical dehydration.  Monitor her volume status.  Aldactone, hydralazine on hold   History of atrial flutter -Xarelto on hold, monitor wound if no surgery restart sat.  Continue Coreg.   GERD - PPI   Morbid obesity with BMI greater than 35 - Recently seen by dietitian   COPD -as needed bronchodilators.  Probably also has OSA,  will order CPAP at bedtime   Recent fungemia - Completed Diflucan on 5/15.  Seen by ID and ophthalmology.     DVT prophylaxis:  Code Status: Full code Family Communication: By phone with sister at bedside Consults called: Orthopedic, Dr. Greta [name] called by ED provider.  Patient will be seen today in the evening Dispo: Patient not medically  stable for discharge, requires continued IV antibiotics  Admission status: Inpatient   Consultants:  As above  Procedures:  DG Forearm Right  Result Date: 01/15/2022 CLINICAL DATA:  Status post right arm irrigation and debridement. Right arm is oozing blood. EXAM: RIGHT FOREARM - 2 VIEW COMPARISON:  CT right forearm 12/19/2021 FINDINGS: Normal bone mineralization. No cortical erosion to indicate radiographic evidence of acute osteomyelitis. There is moderate proximal volar forearm soft tissue swelling. No subcutaneous air is seen. Mild medial elbow joint space narrowing and peripheral osteophytosis. No acute fracture or dislocation. IMPRESSION: 1. Proximal volar forearm soft tissue swelling. No subcutaneous air. 2. No cortical erosion to indicate radiographic evidence of acute osteomyelitis. Electronically Signed   By: Yvonne Kendall M.D.   On: 01/15/2022 16:23   DG Abd 1 View  Result Date: 12/20/2021 CLINICAL DATA:  Feeding tube placement. EXAM: ABDOMEN - 1 VIEW COMPARISON:  None. FINDINGS: A nasogastric tube is seen with its distal tip overlying the expected region of the gastric antrum. The bowel gas pattern is normal. No radio-opaque calculi or other significant radiographic abnormality are seen. An 8 mm soft tissue calcification is seen projecting over the mid left abdomen. This is confirmed to be extrarenal in location upon review of prior abdominal CT studies. IMPRESSION: Nasogastric tube positioning, as described above. Electronically Signed   By: Shannon [name] M.D.   On: 12/20/2021 18:14   MR BRAIN WO CONTRAST  Result Date: 01/04/2022 CLINICAL DATA:  Initial evaluation for delirium. History of metastatic lung carcinoma, recent history of infection. EXAM: MRI HEAD WITHOUT CONTRAST TECHNIQUE: Multiplanar, multiecho pulse sequences of the brain and surrounding structures were obtained without intravenous contrast. COMPARISON:  Prior MRI from 11/03/2021. FINDINGS: Brain: Examination  moderately to severely degraded by motion artifact. Generalized age-related cerebral atrophy. Scattered cerebral white matter disease, most characteristic of chronic microvascular ischemic disease, grossly similar to previous. Area of diffusion signal abnormality measuring 1.6 x 1.5 cm seen at the anterior right temporal lobe (series 5, image 67). This is also seen on coronal DWI (series 12, image 45). Associated signal loss on ADC map (series 6, image 16), with mild T2/FLAIR signal abnormality. Finding consistent with an evolving acute to early subacute ischemic infarct. No associated hemorrhage or significant mass effect. No other evidence for acute or subacute ischemia on this motion degraded exam. Gray-white matter differentiation otherwise maintained. No visible acute intracranial hemorrhage. Punctate chronic microhemorrhage noted at the right thalamus. No visible mass lesion or mass effect. No midline shift or hydrocephalus. No visible extra-axial fluid collection. Susceptibility artifact related to prior right-sided craniotomy noted. Pituitary gland suprasellar region grossly normal. Vascular: Major intracranial vascular flow voids are grossly maintained on this motion degraded exam. Skull and upper cervical spine: Craniocervical junction grossly within normal limits. Heterogeneous and diffusely decreased signal intensity throughout the visualized bone marrow. Known metastatic lesion involving the right clivus without osseous expansion noted, similar to previous. Sinuses/Orbits: Globes and orbital soft tissues demonstrate no acute finding. Paranasal sinuses are largely clear. Trace bilateral mastoid effusions, of doubtful significance. Other: None. IMPRESSION: 1. Motion degraded exam. 2. 1.6 cm evolving acute to early subacute  ischemic infarct involving the anterior right temporal lobe. No associated hemorrhage or significant mass effect. 3. Underlying age-related atrophy with chronic microvascular ischemic  disease. No other acute intracranial abnormality. 4. Known metastatic disease involving the right clivus, similar to previous. Electronically Signed   By: Jeannine Boga M.D.   On: 01/04/2022 05:16   MR BRAIN W CONTRAST  Result Date: 01/05/2022 CLINICAL DATA:  Stroke follow-up. History of metastatic lung cancer and HIV. EXAM: MRI HEAD WITH CONTRAST TECHNIQUE: Multiplanar, multiecho pulse sequences of the brain and surrounding structures were obtained with intravenous contrast. CONTRAST:  76mL GADAVIST GADOBUTROL 1 MMOL/ML IV SOLN COMPARISON:  Noncontrast head MRI 01/03/2022 FINDINGS: The study is moderately to severely motion degraded despite attempts at repeat imaging. No definite enhancement is identified in the anterior right temporal lobe at the site of restricted diffusion on the recent noncontrast MRI, and no definite abnormal intracranial enhancement is identified elsewhere although the degree of motion significantly reduces sensitivity for detection of small enhancing lesions. Prior right parietal craniotomy and a known right central skull base/clival metastasis are again noted. IMPRESSION: Motion degraded examination without definite abnormal intracranial enhancement. Electronically Signed   By: Logan Bores M.D.   On: 01/05/2022 14:37   CT FOREARM RIGHT WO CONTRAST  Result Date: 12/19/2021 CLINICAL DATA:  Soft tissue infection suspected. History of IV infiltration. EXAM: CT OF THE RIGHT FOREARM WITHOUT CONTRAST TECHNIQUE: Multidetector CT imaging was performed according to the standard protocol. Multiplanar CT image reconstructions were also generated. RADIATION DOSE REDUCTION: This exam was performed according to the departmental dose-optimization program which includes automated exposure control, adjustment of the mA and/or kV according to patient size and/or use of iterative reconstruction technique. COMPARISON:  None. FINDINGS: Bones/Joint/Cartilage Bones are normal in density. No  evidence of fracture or osteonecrosis. Evaluation of wrist bones is limited due to motion artifact. Ligaments Suboptimally assessed by CT. Muscles and Tendons Flexor and extensor muscles of the forearm or normal in bulk. No intramuscular fluid collection or abscess. Soft tissues There is marked skin thickening and subcutaneous soft tissue edema. There are multiple pockets of gas in the deep and superficial subcutaneous soft tissues of the anterior aspect of the forearm. The most prominent deep collection adjacent to the common extensor muscles measures approximately 0.8 x 3.2 by 3.4 cm. Multiple other 1-2 cm pockets of gas with marked surrounding edema highly suspicious for necrotizing fasciitis. There are multiple skin blisters over the anterior aspect of the forearm measuring 2-3 cm. There is also marked edema and skin thickening about the extensor aspect of the forearm without evidence of gas or fluid collection. IMPRESSION: 1. Skin thickening and multiple pockets of superficial and deep subcutaneous gas in the anterior aspect of the proximal forearm with marked surrounding edema highly suspicious for necrotizing fasciitis. Correlate with physical examination findings. Follow-up examination with CT with contrast is recommended.\ 2. Multiple skin blisters about the anterior aspect of the forearm oral noted. 3. Muscles are normal in bulk. No intramuscular fluid collection or abscess. 4.  Osseous structures are unremarkable. Above findings were discussed with orthopedic team on-call. Electronically Signed   By: Keane Police D.O.   On: 12/19/2021 23:04   CT TIBIA FIBULA RIGHT WO CONTRAST  Result Date: 12/17/2021 CLINICAL DATA:  Soft tissue infection suspected, lower leg, xray done concern for nec fasc EXAM: CT OF THE LOWER RIGHT EXTREMITY WITHOUT CONTRAST CT OF THE RIGHT FOOT EXTREMITY WITHOUT CONTRAST TECHNIQUE: Multidetector CT imaging of the right lower extremity (tibia and  fibula) was performed according to  the standard protocol. Multidetector CT imaging of the right foot was performed according to the standard protocol. RADIATION DOSE REDUCTION: This exam was performed according to the departmental dose-optimization program which includes automated exposure control, adjustment of the mA and/or kV according to patient size and/or use of iterative reconstruction technique. COMPARISON:  Same day radiographs FINDINGS: Bones/Joint/Cartilage There is no acute osseous abnormality involving the tibia, fibula, or structures of the right foot. There is no frank bony destruction. There is no bony erosion. In the right foot, there is mild tibiotalar osteoarthritis, mild dorsal midfoot spurring, plantar and dorsal calcaneal spurring. Prominent os navicularis. Ligaments Suboptimally assessed by CT. Muscles and Tendons No acute myotendinous abnormality on noncontrast CT. Soft tissues There is extensive skin thickening and subcutaneous soft tissue swelling of the right lower extremity and foot. There is extensive soft tissue gas along the dorsal. There is no soft tissue gas in the hindfoot or above the ankle in the lower leg. There are multiple blister like skin lesions noted, correlate with exam. There is no well-defined/drainable fluid collection on noncontrast CT. IMPRESSION: Extensive dorsal midfoot soft tissue gas concerning for necrotizing soft tissue infection. No soft tissue gas in the hindfoot or above the ankle in the lower extremity. Extensive skin thickening and subcutaneous soft tissue swelling as can be seen in cellulitis. Multiple blister-like skin lesions noted, correlate with exam. No acute osseous abnormality. Electronically Signed   By: Maurine Simmering M.D.   On: 12/17/2021 17:04   CT FOOT RIGHT WO CONTRAST  Result Date: 12/17/2021 CLINICAL DATA:  Soft tissue infection suspected, lower leg, xray done concern for nec fasc EXAM: CT OF THE LOWER RIGHT EXTREMITY WITHOUT CONTRAST CT OF THE RIGHT FOOT EXTREMITY WITHOUT  CONTRAST TECHNIQUE: Multidetector CT imaging of the right lower extremity (tibia and fibula) was performed according to the standard protocol. Multidetector CT imaging of the right foot was performed according to the standard protocol. RADIATION DOSE REDUCTION: This exam was performed according to the departmental dose-optimization program which includes automated exposure control, adjustment of the mA and/or kV according to patient size and/or use of iterative reconstruction technique. COMPARISON:  Same day radiographs FINDINGS: Bones/Joint/Cartilage There is no acute osseous abnormality involving the tibia, fibula, or structures of the right foot. There is no frank bony destruction. There is no bony erosion. In the right foot, there is mild tibiotalar osteoarthritis, mild dorsal midfoot spurring, plantar and dorsal calcaneal spurring. Prominent os navicularis. Ligaments Suboptimally assessed by CT. Muscles and Tendons No acute myotendinous abnormality on noncontrast CT. Soft tissues There is extensive skin thickening and subcutaneous soft tissue swelling of the right lower extremity and foot. There is extensive soft tissue gas along the dorsal. There is no soft tissue gas in the hindfoot or above the ankle in the lower leg. There are multiple blister like skin lesions noted, correlate with exam. There is no well-defined/drainable fluid collection on noncontrast CT. IMPRESSION: Extensive dorsal midfoot soft tissue gas concerning for necrotizing soft tissue infection. No soft tissue gas in the hindfoot or above the ankle in the lower extremity. Extensive skin thickening and subcutaneous soft tissue swelling as can be seen in cellulitis. Multiple blister-like skin lesions noted, correlate with exam. No acute osseous abnormality. Electronically Signed   By: Maurine Simmering M.D.   On: 12/17/2021 17:04   MR FOOT RIGHT W WO CONTRAST  Result Date: 12/17/2021 CLINICAL DATA:  Osteomyelitis of the foot suspected. EXAM: MRI  OF THE RIGHT  FOREFOOT WITHOUT AND WITH CONTRAST TECHNIQUE: Multiplanar, multisequence MR imaging of the right foot was performed before and after the administration of intravenous contrast. CONTRAST:  54mL GADAVIST GADOBUTROL 1 MMOL/ML IV SOLN COMPARISON:  None. FINDINGS: Bones/Joint/Cartilage No MR evidence of osteomyelitis. Mild degenerative changes of the first metatarsophalangeal joint and interphalangeal joint. No appreciable joint effusion of the tibiotalar subtalar and midfoot joints. Ligaments Lisfranc ligament is intact.  Collateral ligaments are intact. Muscles and Tendons Increased intrasubstance signal of the plantar muscles concerning for myopathy/myositis. No intramuscular fluid collection or abscess. Large abscess about the dorsum of the foot as described below which abuts the extensor tendons however no definite evidence of tenosynovitis. Soft tissues Large gas containing necrotizing fluid collection about the dorsum of the foot at the level of the metatarsals measuring at least 1.8 x 6.3 x 8.0 cm (AP x TR x CC) consistent with necrotizing soft tissue infections. IMPRESSION: 1. Large peripherally enhancing gas containing fluid collection about the dorsum of the foot at the level of the metatarsals measuring at least 1.8 x 6.3 x 8.0 cm consistent with necrotizing abscess. 2. The above-mentioned abscess abuts the extensor tendons of the foot without definite evidence of edema along the tendons to suggest tenosynovitis. 3. Increased intramuscular signal of the plantar muscles without evidence of fluid collection or abscess. 4.  No evidence of osteomyelitis. Electronically Signed   By: Keane Police D.O.   On: 12/17/2021 22:40   MR TIBIA FIBULA RIGHT W WO CONTRAST  Result Date: 12/17/2021 CLINICAL DATA:  Osteomyelitis suspected. Wounds to right leg. Fluid-filled blister visible above the bandage. EXAM: MRI OF LOWER RIGHT EXTREMITY WITHOUT AND WITH CONTRAST TECHNIQUE: Multiplanar, multisequence MR  imaging of the right was performed both before and after administration of intravenous contrast. CONTRAST:  75mL GADAVIST GADOBUTROL 1 MMOL/ML IV SOLN COMPARISON:  CT examination performed earlier on the same date FINDINGS: Bones/Joint/Cartilage Bone marrow signal is within normal limits. No evidence of osteomyelitis. Ligaments Intraosseous ligament is unremarkable. Muscles and Tendons Mildly increased intramuscular signal of the flexor, extensor and peroneal compartments, likely reactive secondary to myositis. No intramuscular fluid collection or abscess. Soft tissues Marked skin thickening and subcutaneous soft tissue edema. Skin irregularity about the posterior aspect of the calf, likely multiple skin ulcers. There is a nonenhancing epidermal fluid collection in the anterior subcutaneous soft tissues measuring approximately 1.8 x 5.7 x 4.8 cm likely skin blister. There is skin wound and a peripherally enhancing fluid collection in the medial compartment of the lower leg measuring approximately 1.3 x 3.2 x 5.8 cm, most consistent with an abscess. IMPRESSION: 1.  No MR evidence of osteomyelitis of tibia/fibula. 2. Increased intramuscular signal suggesting myositis without evidence of intramuscular fluid collection or abscess. 3. Marked skin thickening and subcutaneous soft tissue edema with skin irregularity in the posterior compartment of the calf muscles as well as inferiorly about the lateral aspect of the leg. 4. Peripherally enhancing fluid collection in the medial distal leg adjacent to the medial surface of the tibia measuring at least 1.3 x 3.2 x 5.8 cm. 5. A blister-like epididymal fluid collection in the anterior aspect of the proximal leg. Electronically Signed   By: Keane Police D.O.   On: 12/17/2021 22:23   MR TIBIA FIBULA LEFT W WO CONTRAST  Result Date: 12/17/2021 CLINICAL DATA:  Osteomyelitis suspected. EXAM: MRI OF LOWER LEFT EXTREMITY WITHOUT AND WITH CONTRAST TECHNIQUE: Multiplanar,  multisequence MR imaging of the left lower extremity from knee joint to the above ankle  was performed both before and after administration of intravenous contrast. CONTRAST:  81mL GADAVIST GADOBUTROL 1 MMOL/ML IV SOLN COMPARISON:  None FINDINGS: Bones/Joint/Cartilage Bone marrow signal of the left tibia and fibula is within normal limits. No evidence of osteomyelitis. Ligaments Intraosseous ligament is intact. Muscles and Tendons Increased intramuscular signal in particular about the proximal thigh adjacent to with deep skin wound without evidence of intramuscular fluid collection or abscess. Soft tissues Marked skin thickening and subcutaneous soft tissue edema throughout the left lower extremity consistent with severe cellulitis. There is a large deep skin wound about the posterolateral aspect of the leg measures at least 9 cm in horizontal dimension and approximately 19 cm in craniocaudal dimension. No appreciable subcutaneous abscess or fluid collection. IMPRESSION: 1.  No evidence of osteomyelitis. 2. Myositis without evidence of intramuscular fluid collection or abscess. 3. Large deep crater like skin wound about the posterolateral aspect of the leg measuring 9 cm in transverse dimension and at least 19 cm in craniocaudal dimension with near complete loss of the skin. 4. Marked skin thickening and subcutaneous soft tissue edema consistent with severe cellulitis. Electronically Signed   By: Keane Police D.O.   On: 12/17/2021 22:31   DG Chest Port 1 View  Result Date: 01/15/2022 CLINICAL DATA:  Sepsis. EXAM: PORTABLE CHEST 1 VIEW COMPARISON:  01/06/2022 FINDINGS: Cardiomegaly again noted. LEFT perihilar opacity with fiducial markers are unchanged. There is no evidence of airspace disease, pleural effusion, pneumothorax or acute bony abnormality. IMPRESSION: Cardiomegaly without evidence of acute cardiopulmonary disease. Electronically Signed   By: Margarette Canada M.D.   On: 01/15/2022 16:25   DG Chest Port 1  View  Result Date: 01/06/2022 CLINICAL DATA:  Hypoxia, atrial fibrillation. EXAM: PORTABLE CHEST 1 VIEW COMPARISON:  12/20/2021 and prior radiographs FINDINGS: Cardiomegaly again noted. A LEFT-sided PICC line is present with tip difficult to visualize but appears to overlie the LOWER SVC. A RIGHT PICC line is present with tip overlying the mid SVC. LEFT perihilar opacities with fiduciary markers are unchanged. There is no evidence of pneumothorax or pleural effusion. No acute bony abnormality identified. IMPRESSION: 1. No acute abnormalities. 2. Unchanged LEFT perihilar opacities with fiduciary markers. 3. PICC lines as described Electronically Signed   By: Margarette Canada M.D.   On: 01/06/2022 16:01   DG CHEST PORT 1 VIEW  Result Date: 12/20/2021 CLINICAL DATA:  The endotracheal tube and feeding tube placement. EXAM: PORTABLE CHEST 1 VIEW COMPARISON:  December 20, 2021 FINDINGS: An endotracheal tube is seen with its distal tip approximately 2.5 cm from the carina. A nasogastric tube is noted with its distal end extending below the level of the diaphragm. There is stable left-sided PICC line positioning. The heart size and mediastinal contours are within normal limits. Mild atelectatic changes are seen within the bilateral lung bases. There is no evidence of a pleural effusion or pneumothorax. Radiopaque surgical clips are seen overlying the left lung base. Multilevel degenerative changes are seen throughout the thoracic spine. IMPRESSION: 1. Interval endotracheal tube placement and positioning, as described above. 2. Mild bibasilar atelectasis. Electronically Signed   By: Shannon [name] M.D.   On: 12/20/2021 18:11   DG CHEST PORT 1 VIEW  Result Date: 12/20/2021 CLINICAL DATA:  Hypoxia EXAM: PORTABLE CHEST 1 VIEW COMPARISON:  12/19/2021 FINDINGS: Interval placement of left upper extremity PICC line with distal tip terminating at the level of the distal SVC. Stable heart size. Unchanged lingular and left  basilar opacity with adjacent fiduciary markers. Right  lung is clear. No pneumothorax. IMPRESSION: New left upper extremity PICC line.  Otherwise stable chest. Electronically Signed   By: Davina Poke D.O.   On: 12/20/2021 15:56   DG CHEST PORT 1 VIEW  Result Date: 12/19/2021 CLINICAL DATA:  Hypoxia EXAM: PORTABLE CHEST 1 VIEW COMPARISON:  Chest two views 12/17/2021, 12/02/2021, 11/10/2021; CT chest 12/02/2021 FINDINGS: Cardiac silhouette is again moderately enlarged. Mediastinal contours are unchanged. Mildly decreased lung volumes similar to prior.The right lung is clear. There is again scarring within the lingula in the region of 3 surgical clips. No new focal airspace opacity. No definite pleural effusion. No pneumothorax. No acute skeletal abnormality. Bilateral posterior rib expansile lesions are again noted remaining concerning for osseous metastases. Note is made the patient has a history of recurrent left perihilar lung cancer. IMPRESSION: No significant change in left lingular scarring adjacent to fiducial markers in the region of left perihilar recurrent lung cancer. No acute lung process is visualized. Electronically Signed   By: Yvonne Kendall M.D.   On: 12/19/2021 14:06   DG Femur Min 2 Views Right  Result Date: 01/15/2022 CLINICAL DATA:  Recent RIGHT above the knee amputation. Redness at the amputation site reported by family. EXAM: RIGHT FEMUR 2 VIEWS COMPARISON:  None Available. FINDINGS: Limited evaluation is due to limited patient positioning and patient did not allow for further repositioning. RIGHT AKA changes are noted. No acute fracture, dislocation or acute osteomyelitis noted. IMPRESSION: 1. RIGHT AKA changes without acute bony abnormality or acute osteomyelitis. Electronically Signed   By: Margarette Canada M.D.   On: 01/15/2022 16:21   VAS [place] UPPER EXTREMITY VENOUS DUPLEX  Result Date: 12/19/2021 UPPER VENOUS STUDY  Patient Name:  KAELYNN IGO  Date of Exam:   12/19/2021 Medical  Rec #: [id number]       Accession #:    [id number] Date of Birth: 02-May-1956       Patient Gender: F Patient Age:   60 years Exam Location:  [facility] Procedure:      VAS [place] UPPER EXTREMITY VENOUS DUPLEX Referring Phys: MICHAEL JEFFERY --------------------------------------------------------------------------------  Other Indications: Diffuse edema and blistering to RUE - previous IV placement. Limitations: Body habitus, poor ultrasound/tissue interface, patient movement and bandages. Comparison Study: No previous exams Performing Technologist: Jody Hill RVT, RDMS  Examination Guidelines: A complete evaluation includes B-mode imaging, spectral Doppler, color Doppler, and power Doppler as needed of all accessible portions of each vessel. Bilateral testing is considered an integral part of a complete examination. Limited examinations for reoccurring indications may be performed as noted.  Right Findings: +----------+------------+---------+-----------+----------+--------------------+ RIGHT     CompressiblePhasicitySpontaneousProperties      Summary        +----------+------------+---------+-----------+----------+--------------------+ IJV           Full       Yes       Yes                                   +----------+------------+---------+-----------+----------+--------------------+ Subclavian    Full       Yes       Yes                                   +----------+------------+---------+-----------+----------+--------------------+ Axillary      Full       Yes  Yes                                   +----------+------------+---------+-----------+----------+--------------------+ Brachial                 Yes       Yes                   patent by                                                              color/doppler     +----------+------------+---------+-----------+----------+--------------------+ Radial                                                 Not  visualized    +----------+------------+---------+-----------+----------+--------------------+ Ulnar                                                  Not visualized    +----------+------------+---------+-----------+----------+--------------------+ Cephalic                                               Not visualized    +----------+------------+---------+-----------+----------+--------------------+ Basilic                                                Not visualized    +----------+------------+---------+-----------+----------+--------------------+ Bandage to entire forearm. Diffuse edema to RUE.  Left Findings: +----------+------------+---------+-----------+----------+--------------+ LEFT      CompressiblePhasicitySpontaneousProperties   Summary     +----------+------------+---------+-----------+----------+--------------+ Subclavian                                          Not visualized +----------+------------+---------+-----------+----------+--------------+ Patient uncooperative - unable to visualize left subclavian vein.  Summary:  Right: No evidence of deep vein thrombosis in the upper extremity. However, unable to visualize the cephalic, basilic, radial and ulnar veins.  *See table(s) above for measurements and observations.  Diagnosing physician: Orlie Pollen Electronically signed by Orlie Pollen on 12/19/2021 at 6:06:15 PM.    Final    [place] EKG SITE RITE  Result Date: 12/19/2021 If Site Rite image not attached, placement could not be confirmed due to current cardiac rhythm.      Subjective: On phone with his sister  Objective: Vitals:   01/15/22 2129 01/15/22 2242 01/16/22 0520 01/16/22 1348  BP:  (!) 141/71 (!) 151/76 117/65  Pulse:  93 98 81  Resp:  18 18 18   Temp:  (!) 97.5 F (36.4 C) 98.1 F (36.7 C) 98.6 F (37 C)  TempSrc:  Oral Oral Oral  SpO2: 100% (!) 85% 97% 90%  Intake/Output Summary (Last 24 hours) at 01/16/2022 1434 Last data filed  at 01/16/2022 7494 Gross per 24 hour  Intake 1770.98 ml  Output 500 ml  Net 1270.98 ml   There were no vitals filed for this visit.  Examination: Constitutional: Not in acute distress, appears chronically ill Respiratory: Clear to auscultation bilaterally Cardiovascular: Normal sinus rhythm, no rubs Abdomen: Nontender nondistended good bowel sounds Musculoskeletal: No edema noted Skin: Right upper extremity swelling and mild sanguinous discharge between her sutures, warmth and erythema tracking closer to her armpit.  Right lower extremity wound looks unremarkable.  Right lower extremity AKA. Neurologic: CN 2-12 grossly intact.  And nonfocal Psychiatric: Normal judgment and insight. Alert and oriented x 3. Normal mood.        Data Reviewed: I have personally reviewed following labs and imaging studies  CBC: Recent Labs  Lab 01/10/22 0630 01/12/22 1135 01/13/22 0500 01/15/22 1531 01/16/22 0934  WBC 8.4 10.7* 9.3 9.2 7.5  NEUTROABS  --   --   --  8.3*  --   HGB 9.0* 10.0* 9.4* 10.1* 9.4*  HCT 29.3* 31.9* 30.1* 32.9* 30.2*  MCV 101.7* 100.9* 101.3* 103.1* 104.1*  PLT 163 139* 125* 106* 89*   Basic Metabolic Panel: Recent Labs  Lab 01/12/22 1135 01/13/22 0500 01/14/22 1211 01/15/22 1531 01/16/22 0934  NA 142 142 144 145 143  K 4.7 4.5 4.6 4.9 4.3  CL 100 99 102 103 106  CO2 36* 36* 35* 34* 34*  GLUCOSE 152* 164* 171* 258* 94  BUN 55* 59* 52* 50* 41*  CREATININE 1.17* 1.06* 1.10* 1.15* 0.87  CALCIUM 10.5* 10.3 10.2 10.2 9.3  MG  --   --  1.9  --  2.0   GFR: Estimated Creatinine Clearance: 69.5 mL/min (by C-G formula based on SCr of 0.87 mg/dL). Liver Function Tests: Recent Labs  Lab 01/10/22 0630 01/11/22 0822 01/12/22 1135 01/13/22 0500 01/15/22 1531  AST 13* 14* 19 17 26   ALT 26 25 31  38 44  ALKPHOS 80 72 80 79 84  BILITOT 0.4 0.6 0.7 0.9 0.5  PROT 5.1* 5.3* 5.3* 5.2* 5.7*  ALBUMIN 2.6* 2.7* 2.7* 2.7* 2.9*   No results for input(s): LIPASE, AMYLASE  in the last 168 hours. No results for input(s): AMMONIA in the last 168 hours. Coagulation Profile: Recent Labs  Lab 01/15/22 1531  INR 1.1   Cardiac Enzymes: No results for input(s): CKTOTAL, CKMB, CKMBINDEX, TROPONINI in the last 168 hours. BNP (last 3 results) No results for input(s): PROBNP in the last 8760 hours. HbA1C: No results for input(s): HGBA1C in the last 72 hours. CBG: Recent Labs  Lab 01/14/22 0805 01/14/22 1144 01/15/22 1849 01/16/22 0726 01/16/22 1134  GLUCAP 121* 206* 207* 89 86   Lipid Profile: No results for input(s): CHOL, HDL, LDLCALC, TRIG, CHOLHDL, LDLDIRECT in the last 72 hours. Thyroid Function Tests: No results for input(s): TSH, T4TOTAL, FREET4, T3FREE, THYROIDAB in the last 72 hours. Anemia Panel: No results for input(s): VITAMINB12, FOLATE, FERRITIN, TIBC, IRON, RETICCTPCT in the last 72 hours. Sepsis Labs: Recent Labs  Lab 01/15/22 1531  LATICACIDVEN 1.0    Recent Results (from the past 240 hour(s))  Blood Culture (routine x 2)     Status: None (Preliminary result)   Collection Time: 01/15/22  3:20 PM   Specimen: BLOOD  Result Value Ref Range Status   Specimen Description   Final    BLOOD BLOOD LEFT FOREARM Performed at [facility], [place]  [address] [place], [place] [address]    Special Requests   Final    BOTTLES DRAWN AEROBIC AND ANAEROBIC Blood Culture results may not be optimal due to an excessive volume of blood received in culture bottles Performed at [facility] [address] [place], [place] [address]    Culture   Final    NO GROWTH < 12 HOURS Performed at [facility] [address] [place], [place] [address]    Report Status PENDING  Incomplete  Resp Panel by RT-PCR (Flu A&B, Covid) Anterior Nasal Swab     Status: None   Collection Time: 01/15/22  3:31 PM   Specimen: Anterior Nasal Swab  Result Value Ref Range Status   SARS Coronavirus 2 by RT PCR NEGATIVE NEGATIVE  Final    Comment: (NOTE) SARS-CoV-2 target nucleic acids are NOT DETECTED.  The SARS-CoV-2 RNA is generally detectable in upper respiratory specimens during the acute phase of infection. The lowest concentration of SARS-CoV-2 viral copies this assay can detect is 138 copies/mL. A negative result does not preclude SARS-Cov-2 infection and should not be used as the sole basis for treatment or other patient management decisions. A negative result may occur with  improper specimen collection/handling, submission of specimen other than nasopharyngeal swab, presence of viral mutation(s) within the areas targeted by this assay, and inadequate number of viral copies(<138 copies/mL). A negative result must be combined with clinical observations, patient history, and epidemiological information. The expected result is Negative.  Fact Sheet for Patients:  EntrepreneurPulse.com.au  Fact Sheet for Healthcare Providers:  IncredibleEmployment.be  This test is no t yet approved or cleared by the [place] FDA and  has been authorized for detection and/or diagnosis of SARS-CoV-2 by FDA under an Emergency Use Authorization (EUA). This EUA will remain  in effect (meaning this test can be used) for the duration of the COVID-19 declaration under Section 564(b)(1) of the Act, 21 U.S.C.section 360bbb-3(b)(1), unless the authorization is terminated  or revoked sooner.       Influenza A by PCR NEGATIVE NEGATIVE Final   Influenza B by PCR NEGATIVE NEGATIVE Final    Comment: (NOTE) The Xpert Xpress SARS-CoV-2/FLU/RSV plus assay is intended as an aid in the diagnosis of influenza from Nasopharyngeal swab specimens and should not be used as a sole basis for treatment. Nasal washings and aspirates are unacceptable for Xpert Xpress SARS-CoV-2/FLU/RSV testing.  Fact Sheet for Patients: EntrepreneurPulse.com.au  Fact Sheet for Healthcare  Providers: IncredibleEmployment.be  This test is not yet approved or cleared by the [place] FDA and has been authorized for detection and/or diagnosis of SARS-CoV-2 by FDA under an Emergency Use Authorization (EUA). This EUA will remain in effect (meaning this test can be used) for the duration of the COVID-19 declaration under Section 564(b)(1) of the Act, 21 U.S.C. section 360bbb-3(b)(1), unless the authorization is terminated or revoked.  Performed at [facility], [place] [address] [place], [place] [address]          Radiology Studies: DG Forearm Right  Result Date: 01/15/2022 CLINICAL DATA:  Status post right arm irrigation and debridement. Right arm is oozing blood. EXAM: RIGHT FOREARM - 2 VIEW COMPARISON:  CT right forearm 12/19/2021 FINDINGS: Normal bone mineralization. No cortical erosion to indicate radiographic evidence of acute osteomyelitis. There is moderate proximal volar forearm soft tissue swelling. No subcutaneous air is seen. Mild medial elbow joint space narrowing and peripheral osteophytosis. No acute fracture or dislocation. IMPRESSION: 1. Proximal volar  forearm soft tissue swelling. No subcutaneous air. 2. No cortical erosion to indicate radiographic evidence of acute osteomyelitis. Electronically Signed   By: Yvonne Kendall M.D.   On: 01/15/2022 16:23   DG Chest Port 1 View  Result Date: 01/15/2022 CLINICAL DATA:  Sepsis. EXAM: PORTABLE CHEST 1 VIEW COMPARISON:  01/06/2022 FINDINGS: Cardiomegaly again noted. LEFT perihilar opacity with fiducial markers are unchanged. There is no evidence of airspace disease, pleural effusion, pneumothorax or acute bony abnormality. IMPRESSION: Cardiomegaly without evidence of acute cardiopulmonary disease. Electronically Signed   By: Margarette Canada M.D.   On: 01/15/2022 16:25   DG Femur Min 2 Views Right  Result Date: 01/15/2022 CLINICAL DATA:  Recent RIGHT above the knee amputation. Redness  at the amputation site reported by family. EXAM: RIGHT FEMUR 2 VIEWS COMPARISON:  None Available. FINDINGS: Limited evaluation is due to limited patient positioning and patient did not allow for further repositioning. RIGHT AKA changes are noted. No acute fracture, dislocation or acute osteomyelitis noted. IMPRESSION: 1. RIGHT AKA changes without acute bony abnormality or acute osteomyelitis. Electronically Signed   By: Margarette Canada M.D.   On: 01/15/2022 16:21        Scheduled Meds:  bictegravir-emtricitabine-tenofovir AF  1 tablet Oral Daily   carvedilol  12.5 mg Oral BID WC   docusate sodium  100 mg Oral Daily   insulin aspart  0-5 Units Subcutaneous QHS   insulin aspart  0-9 Units Subcutaneous TID WC   insulin detemir  8 Units Subcutaneous QHS   pantoprazole  40 mg Oral Daily   Continuous Infusions:  sodium chloride 75 mL/hr at 01/15/22 1954   ceFEPime (MAXIPIME) IV 2 g (01/16/22 0526)   metronidazole 500 mg (01/16/22 0410)   vancomycin       LOS: 0 days    Time spent: 57 min    Nicolette Bang, MD [facility]  If 7PM-7AM, please contact night-coverage  01/16/2022, 2:34 PM

## 2022-01-16 NOTE — NC FL2 (Cosign Needed)
[place] MEDICAID FL2 LEVEL OF CARE SCREENING TOOL     IDENTIFICATION  Patient Name: Shannon [name] Birthdate: 02-17-1956 Sex: female Admission Date (Current Location): 01/15/2022  [place] and [place] Number:  Kathleen Argue [id number] [place] and Address:  [facility],  [place], [place]      Provider Number: [id number]  Attending Physician Name and Address:  Marcell Anger*  Relative Name and Phone Number:  Francis Dowse (sister) Ph: [phone]    Current Level of Care: Hospital Recommended Level of Care: [place] Prior Approval Number:    Date Approved/Denied:   PASRR Number: [id number]  Discharge Plan: SNF    Current Diagnoses: Patient Active Problem List   Diagnosis Date Noted   Cellulitis 01/16/2022   Cellulitis of right arm 01/15/2022   Macrocytic anemia 12/24/2021   Pressure injury of skin 12/22/2021   Fungemia    Acute respiratory failure with hypercapnia (HCC)    Gas gangrene of foot ([place])    Abscess of foot 12/17/2021   Abscess of right foot 12/17/2021   Necrotizing fasciitis ([place]) 12/17/2021   Malignant neoplasm metastatic to bone ([place]) 10/06/2021   Chest wall pain 08/19/2021   Acute right-sided thoracic back pain 08/12/2021   Intercostal muscle strain 04/14/2021   Rib fracture 03/12/2021   Osteoporosis 03/12/2021   Panniculitis 01/23/2021   Healthcare maintenance 01/23/2021   On supplemental oxygen therapy 11/19/2020   Insomnia 11/14/2020   Burn erythema of breast, initial encounter 11/05/2020   Skin ulcer ([place]) 06/24/2020   Pulmonary nodule 02/14/2020   Nodule of upper lobe of left lung 02/06/2020   Thyroid nodule 01/26/2020   Adenocarcinoma of lung ([place]) 01/04/2020   Chronic obstructive asthma ([place]) 12/29/2019   (HFpEF) heart failure with preserved ejection fraction ([place]) 02/07/2019   Acute on chronic heart failure with preserved ejection fraction (HFpEF) ([place]) 02/04/2019   Vitamin  D deficiency 06/30/2018   Chronic anticoagulation 01/03/2018   Chronic respiratory failure with hypoxia ([place]) 11/30/2017   Atrial flutter ([place]) 03/11/2017   CAD (coronary artery disease) 01/28/2017   Obesity (BMI 30-39.9) 01/13/2017   Ascending aorta dilatation ([place]) 01/07/2017   Low back pain radiating to right lower extremity 12/02/2016   Acquired cyst of kidney 12/02/2016   Chronic kidney disease (CKD), stage III (moderate) (HCC) 12/01/2016   Generalized anxiety disorder 10/22/2016   Hypersomnia 10/01/2016   Accessory skin tags 06/10/2016   Nocturnal hypoxemia 05/13/2016   Cervical radiculopathy 04/14/2016   GERD (gastroesophageal reflux disease) 01/02/2016   Morbid (severe) obesity due to excess calories ([place]) 10/10/2015   Vitamin B12 deficiency 10/02/2015   HIV disease ([place]) 07/09/2015   Uncontrolled hypertension 07/09/2015    Orientation RESPIRATION BLADDER Height & Weight     Self, Place  Normal Incontinent Weight:   Height:     BEHAVIORAL SYMPTOMS/MOOD NEUROLOGICAL BOWEL NUTRITION STATUS      Incontinent    AMBULATORY STATUS COMMUNICATION OF NEEDS Skin   Extensive Assist Verbally                         Personal Care Assistance Level of Assistance  Bathing, Feeding, Dressing Bathing Assistance: Maximum assistance Feeding assistance: Limited assistance Dressing Assistance: Maximum assistance     Functional Limitations Info  Sight, Hearing, Speech Sight Info: Adequate Hearing Info: Adequate Speech Info: Adequate    SPECIAL CARE FACTORS FREQUENCY  PT (By licensed PT), OT (By licensed OT)     PT Frequency: 5x's/Week  OT Frequency: 5x's/week            Contractures Contractures Info: Not present    Additional Factors Info  Code Status, Allergies, Psychotropic, Insulin Sliding Scale Code Status Info: Full Allergies Info: Lisinopril, Tree Extract, Augmentin (Amoxicillin-pot Clavulanate), Ciprofloxacin Psychotropic Info: See MAR Insulin Sliding  Scale Info: See discharge summary       Current Medications (01/16/2022):  This is the current hospital active medication list Current Facility-Administered Medications  Medication Dose Route Frequency Provider Last Rate Last Admin   0.9 %  sodium chloride infusion   Intravenous Continuous Damita Lack, MD 75 mL/hr at 01/15/22 1954 New Bag at 01/15/22 1954   acetaminophen (TYLENOL) tablet 650 mg  650 mg Oral Q6H PRN Amin, Ankit Chirag, MD       alum & mag hydroxide-simeth (MAALOX/MYLANTA) 200-200-20 MG/5ML suspension 30 mL  30 mL Oral Q4H PRN Amin, Ankit Chirag, MD       bictegravir-emtricitabine-tenofovir AF (BIKTARVY) 50-200-25 MG per tablet 1 tablet  1 tablet Oral Daily Amin, Ankit Chirag, MD   1 tablet at 01/16/22 1054   carvedilol (COREG) tablet 12.5 mg  12.5 mg Oral BID WC Amin, Ankit Chirag, MD   12.5 mg at 01/16/22 1054   ceFEPIme (MAXIPIME) 2 g in sodium chloride 0.9 % 100 mL IVPB  2 g Intravenous Q12H [place], Abby K, RPH 200 mL/hr at 01/16/22 0526 2 g at 01/16/22 0526   docusate sodium (COLACE) capsule 100 mg  100 mg Oral Daily Amin, Ankit Chirag, MD   100 mg at 01/16/22 1054   enoxaparin (LOVENOX) injection 40 mg  40 mg Subcutaneous Q24H Spongberg, Audie Pinto, MD       guaiFENesin (ROBITUSSIN) 100 MG/5ML liquid 5 mL  5 mL Oral Q4H PRN Amin, Ankit Chirag, MD       hydrALAZINE (APRESOLINE) injection 10 mg  10 mg Intravenous Q4H PRN Amin, Ankit Chirag, MD       hydrocortisone (ANUSOL-HC) 2.5 % rectal cream 1 application.  1 application. Topical QID PRN Amin, Ankit Chirag, MD       hydrocortisone cream 1 % 1 application.  1 application. Topical TID PRN Amin, Ankit Chirag, MD       insulin aspart (novoLOG) injection 0-5 Units  0-5 Units Subcutaneous QHS Amin, Ankit Chirag, MD   2 Units at 01/15/22 2100   insulin aspart (novoLOG) injection 0-9 Units  0-9 Units Subcutaneous TID WC Amin, Ankit Chirag, MD       insulin detemir (LEVEMIR) injection 8 Units  8 Units Subcutaneous QHS  Amin, Ankit Chirag, MD   8 Units at 01/15/22 2113   ipratropium-albuterol (DUONEB) 0.5-2.5 (3) MG/3ML nebulizer solution 3 mL  3 mL Nebulization Q4H PRN Amin, Ankit Chirag, MD       lip balm (CARMEX) ointment 1 application.  1 application. Topical PRN Amin, Jeanella Flattery, MD       loratadine (CLARITIN) tablet 10 mg  10 mg Oral Daily PRN Amin, Ankit Chirag, MD       metoprolol tartrate (LOPRESSOR) injection 5 mg  5 mg Intravenous Q4H PRN Amin, Ankit Chirag, MD       metroNIDAZOLE (FLAGYL) IVPB 500 mg  500 mg Intravenous Q12H Amin, Ankit Chirag, MD 100 mL/hr at 01/16/22 0410 500 mg at 01/16/22 0410   Muscle Rub CREA 1 application.  1 application. Topical PRN Amin, Ankit Chirag, MD       oxyCODONE (Oxy IR/ROXICODONE) immediate release tablet 5 mg  5 mg Oral  Q4H PRN Damita Lack, MD   5 mg at 01/16/22 0525   pantoprazole (PROTONIX) EC tablet 40 mg  40 mg Oral Daily Amin, Ankit Chirag, MD   40 mg at 01/16/22 1054   phenol (CHLORASEPTIC) mouth spray 1 spray  1 spray Mouth/Throat PRN Amin, Ankit Chirag, MD       polyvinyl alcohol (LIQUIFILM TEARS) 1.4 % ophthalmic solution 1 drop  1 drop Both Eyes PRN Amin, Ankit Chirag, MD       senna-docusate (Senokot-S) tablet 1 tablet  1 tablet Oral QHS PRN Amin, Ankit Chirag, MD       sodium chloride (OCEAN) 0.65 % nasal spray 1 spray  1 spray Each Nare PRN Amin, Ankit Chirag, MD       traZODone (DESYREL) tablet 50 mg  50 mg Oral QHS PRN Amin, Ankit Chirag, MD   50 mg at 01/15/22 2134   vancomycin (VANCOREADY) IVPB 750 mg/150 mL  750 mg Intravenous Q24H Tawnya Crook, [facility]         Discharge Medications: Please see discharge summary for a list of discharge medications.  Relevant Imaging Results:  Relevant Lab Results:   Additional Information SSN: [id number]  Vassie Moselle, LCSW

## 2022-01-16 NOTE — TOC Initial Note (Signed)
Transition of Care [facility]) - Initial/Assessment Note    Patient Details  Name: Shannon [name] MRN: [id number] Date of Birth: [birth year]  Transition of Care [facility]) CM/SW Contact:    Bethann Berkshire, [place] Phone Number: 01/16/2022, 3:12 PM  Clinical Narrative:                  Pt not fully oriented. CSW called pt's sister, Sharyn Lull to discuss SNF. Sharyn Lull reports she is in [place] and she is pt's power of attorney; states that cone should have this documentation. CSW inquires about recent admission to [facility]. Sharyn Lull explains that pt was at [facility] for less than 12 hours before needing to return to hospital. She feels that pt was discharged too early on previous admission and is concerned and upset about this. She asks if [facility] is holding pts bed; CSW explained that this will depend on length of pt's admission and what [facility] decides. She also expresses concern that she has been unable to access pt's MYCHART since pt is confused and unable to make decisions. Sharyn Lull attempted to reset pt's password but the new password was sent to pts email which pt is unable to log into. She requests assistance with this. CSW also explains TOC coverage may vary over course of pt's admission. Explained that best way to identify [facility] covering unit would be to call unit RN station which she has the number to.   CSW called Mychart and is informed Sharyn Lull would need to complete Proxy form. CSW emailed Sharyn Lull form; she will print out and return for CSW to place on chart. MD notified that form would be on physical chart to sign.   Fl2 Completed and faxed in hub to [employer].   Expected Discharge Plan: Skilled Nursing Facility Barriers to Discharge: Ship broker, Continued Medical Work up   Patient Goals and CMS Choice        Expected Discharge Plan and Services Expected Discharge Plan: [place] Acute Care Choice: [place]  arrangements for the past 2 months: Apartment                                      Prior Living Arrangements/Services Living arrangements for the past 2 months: Apartment Lives with:: Self Patient language and need for interpreter reviewed:: Yes        Need for Family Participation in Patient Care: Yes (Comment) Care giver support system in place?: No (comment)   Criminal Activity/Legal Involvement Pertinent to Current Situation/Hospitalization: No - Comment as needed  Activities of Daily Living Home Assistive Devices/Equipment: Eyeglasses, Wheelchair, Environmental consultant (specify type), Bedside commode/3-in-1, Shower chair with back ADL Screening (condition at time of admission) Patient's cognitive ability adequate to safely complete daily activities?: No Is the patient deaf or have difficulty hearing?: No Does the patient have difficulty seeing, even when wearing glasses/contacts?: No Does the patient have difficulty concentrating, remembering, or making decisions?: Yes Patient able to express need for assistance with ADLs?: Yes Does the patient have difficulty dressing or bathing?: Yes Independently performs ADLs?: No Communication: Independent Dressing (OT): Needs assistance Is this a change from baseline?: Pre-admission baseline Grooming: Needs assistance Is this a change from baseline?: Pre-admission baseline Feeding: Needs assistance Is this a change from baseline?: Pre-admission baseline Bathing: Needs assistance Is this a change from baseline?: Pre-admission baseline Toileting: Needs assistance Is this  a change from baseline?: Pre-admission baseline In/Out Bed: Needs assistance Is this a change from baseline?: Pre-admission baseline Walks in Home: Dependent Is this a change from baseline?: Pre-admission baseline Does the patient have difficulty walking or climbing stairs?: Yes Weakness of Legs: Both Weakness of Arms/Hands: Both  Permission Sought/Granted                   Emotional Assessment       Orientation: : Oriented to Self, Oriented to Place Alcohol / Substance Use: Not Applicable Psych Involvement: No (comment)  Admission diagnosis:  Cellulitis of right arm [L03.113] Right forearm cellulitis [L03.113] H/O necrotizing fasciitis [Z87.39] Cellulitis [L03.90] Patient Active Problem List   Diagnosis Date Noted   Cellulitis 01/16/2022   Cellulitis of right arm 01/15/2022   Macrocytic anemia 12/24/2021   Pressure injury of skin 12/22/2021   Fungemia    Acute respiratory failure with hypercapnia (HCC)    Gas gangrene of foot (HCC)    Abscess of foot 12/17/2021   Abscess of right foot 12/17/2021   Necrotizing fasciitis ([place]) 12/17/2021   Malignant neoplasm metastatic to bone ([place]) 10/06/2021   Chest wall pain 08/19/2021   Acute right-sided thoracic back pain 08/12/2021   Intercostal muscle strain 04/14/2021   Rib fracture 03/12/2021   Osteoporosis 03/12/2021   Panniculitis 01/23/2021   Healthcare maintenance 01/23/2021   On supplemental oxygen therapy 11/19/2020   Insomnia 11/14/2020   Burn erythema of breast, initial encounter 11/05/2020   Skin ulcer ([place]) 06/24/2020   Pulmonary nodule 02/14/2020   Nodule of upper lobe of left lung 02/06/2020   Thyroid nodule 01/26/2020   Adenocarcinoma of lung ([place]) 01/04/2020   Chronic obstructive asthma ([place]) 12/29/2019   (HFpEF) heart failure with preserved ejection fraction ([place]) 02/07/2019   Acute on chronic heart failure with preserved ejection fraction (HFpEF) ([place]) 02/04/2019   Vitamin D deficiency 06/30/2018   Chronic anticoagulation 01/03/2018   Chronic respiratory failure with hypoxia ([place]) 11/30/2017   Atrial flutter ([place]) 03/11/2017   CAD (coronary artery disease) 01/28/2017   Obesity (BMI 30-39.9) 01/13/2017   Ascending aorta dilatation ([place]) 01/07/2017   Low back pain radiating to right lower extremity 12/02/2016   Acquired cyst of kidney 12/02/2016   Chronic kidney  disease (CKD), stage III (moderate) (HCC) 12/01/2016   Generalized anxiety disorder 10/22/2016   Hypersomnia 10/01/2016   Accessory skin tags 06/10/2016   Nocturnal hypoxemia 05/13/2016   Cervical radiculopathy 04/14/2016   GERD (gastroesophageal reflux disease) 01/02/2016   Morbid (severe) obesity due to excess calories ([place]) 10/10/2015   Vitamin B12 deficiency 10/02/2015   HIV disease ([place]) 07/09/2015   Uncontrolled hypertension 07/09/2015   PCP:  [name], MD Pharmacy:   [id number] [place], [place] - [address] AT [place] [address] Phone: [phone] Fax: [phone]  [facility] [address] [place] [address] Phone: [phone] Fax: [phone]     Social Determinants of Health (SDOH) Interventions    Readmission Risk Interventions    12/22/2021    9:44 AM  Readmission Risk Prevention Plan  Transportation Screening Complete  Medication Review (RN Care Manager) Complete  HRI or Home Care Consult Complete  SW Recovery Care/Counseling Consult Complete  Palliative Care Screening Not Applicable  Skilled Nursing Facility Complete

## 2022-01-16 NOTE — Consult Note (Signed)
WOC Nurse Consult Note: Patient receiving care in [place].  Reason for Consult: RUE and AKA wounds Wound type: both are surgical sites. Dr. Murrell Redden with Orthopedic surgery saw the patient yesterday for the RUE. Please see his note from 18:18 on 01/15/22 for care provided and dressing recommendations to the RUE.  Pressure Injury POA: Yes/No/NA Measurement: Wound bed: Right AKA appears to have some superficial skin necrosis along the incision line. If concerns exist for the condition of this wound, Dr. Sharol Given performed the amputation surgery 12/26/21 and should be contacted directly. Drainage (amount, consistency, odor)  Periwound: see photo of AKA in record. Dressing procedure/placement/frequency: Place ABD pads over the RLE amputation site, secure with tape. Change every 2 days and prn.  [place] nurse will not follow at this time.  Please re-consult the [place] team if needed.  Val Riles, RN, MSN, CWOCN, CNS-BC, pager [phone]

## 2022-01-17 DIAGNOSIS — L03113 Cellulitis of right upper limb: Secondary | ICD-10-CM | POA: Diagnosis not present

## 2022-01-17 DIAGNOSIS — N183 Chronic kidney disease, stage 3 unspecified: Secondary | ICD-10-CM | POA: Diagnosis not present

## 2022-01-17 DIAGNOSIS — I5033 Acute on chronic diastolic (congestive) heart failure: Secondary | ICD-10-CM | POA: Diagnosis not present

## 2022-01-17 DIAGNOSIS — I483 Typical atrial flutter: Secondary | ICD-10-CM | POA: Diagnosis not present

## 2022-01-17 LAB — CBC WITH DIFFERENTIAL/PLATELET
Abs Immature Granulocytes: 0.05 10*3/uL (ref 0.00–0.07)
Basophils Absolute: 0 10*3/uL (ref 0.0–0.1)
Basophils Relative: 0 %
Eosinophils Absolute: 0.2 10*3/uL (ref 0.0–0.5)
Eosinophils Relative: 2 %
HCT: 30.9 % — ABNORMAL LOW (ref 36.0–46.0)
Hemoglobin: 9.7 g/dL — ABNORMAL LOW (ref 12.0–15.0)
Immature Granulocytes: 1 %
Lymphocytes Relative: 7 %
Lymphs Abs: 0.6 10*3/uL — ABNORMAL LOW (ref 0.7–4.0)
MCH: 31.7 pg (ref 26.0–34.0)
MCHC: 31.4 g/dL (ref 30.0–36.0)
MCV: 101 fL — ABNORMAL HIGH (ref 80.0–100.0)
Monocytes Absolute: 0.5 10*3/uL (ref 0.1–1.0)
Monocytes Relative: 6 %
Neutro Abs: 6.6 10*3/uL (ref 1.7–7.7)
Neutrophils Relative %: 84 %
Platelets: 79 10*3/uL — ABNORMAL LOW (ref 150–400)
RBC: 3.06 MIL/uL — ABNORMAL LOW (ref 3.87–5.11)
RDW: 16.3 % — ABNORMAL HIGH (ref 11.5–15.5)
WBC: 7.9 10*3/uL (ref 4.0–10.5)
nRBC: 0 % (ref 0.0–0.2)

## 2022-01-17 LAB — BASIC METABOLIC PANEL
Anion gap: 7 (ref 5–15)
BUN: 35 mg/dL — ABNORMAL HIGH (ref 8–23)
CO2: 29 mmol/L (ref 22–32)
Calcium: 9.4 mg/dL (ref 8.9–10.3)
Chloride: 105 mmol/L (ref 98–111)
Creatinine, Ser: 0.91 mg/dL (ref 0.44–1.00)
GFR, Estimated: >60 mL/min (ref >60)
Glucose, Bld: 66 mg/dL — ABNORMAL LOW (ref 70–99)
Potassium: 3.7 mmol/L (ref 3.5–5.1)
Sodium: 141 mmol/L (ref 135–145)

## 2022-01-17 LAB — GLUCOSE, CAPILLARY
Glucose-Capillary: 67 mg/dL — ABNORMAL LOW (ref 70–99)
Glucose-Capillary: 110 mg/dL — ABNORMAL HIGH (ref 70–99)
Glucose-Capillary: 136 mg/dL — ABNORMAL HIGH (ref 70–99)
Glucose-Capillary: 97 mg/dL (ref 70–99)
Glucose-Capillary: 97 mg/dL (ref 70–99)

## 2022-01-17 LAB — MAGNESIUM: Magnesium: 1.8 mg/dL (ref 1.7–2.4)

## 2022-01-17 MED ORDER — POTASSIUM CHLORIDE CRYS ER 20 MEQ PO TBCR
40.0000 meq | EXTENDED_RELEASE_TABLET | Freq: Once | ORAL | Status: AC
  Administered 2022-01-17: 40 meq via ORAL
  Filled 2022-01-17: qty 2

## 2022-01-17 MED ORDER — ORAL CARE MOUTH RINSE
15.0000 mL | Freq: Two times a day (BID) | OROMUCOSAL | Status: DC
  Administered 2022-01-17: 15 mL via OROMUCOSAL

## 2022-01-17 MED ORDER — CHLORHEXIDINE GLUCONATE 0.12 % MT SOLN
15.0000 mL | Freq: Two times a day (BID) | OROMUCOSAL | Status: DC
  Administered 2022-01-17 – 2022-01-18 (×3): 15 mL via OROMUCOSAL
  Filled 2022-01-17 (×3): qty 15

## 2022-01-17 MED ORDER — VANCOMYCIN HCL IN DEXTROSE 1-5 GM/200ML-% IV SOLN
1000.0000 mg | INTRAVENOUS | Status: DC
  Administered 2022-01-17 – 2022-01-19 (×3): 1000 mg via INTRAVENOUS
  Filled 2022-01-17 (×4): qty 200

## 2022-01-17 MED ORDER — SODIUM CHLORIDE 0.9 % IV SOLN
2.0000 g | Freq: Three times a day (TID) | INTRAVENOUS | Status: DC
  Administered 2022-01-17 – 2022-01-20 (×10): 2 g via INTRAVENOUS
  Filled 2022-01-17 (×9): qty 12.5

## 2022-01-17 NOTE — Progress Notes (Addendum)
PROGRESS NOTE    Shannon [name]  WLN:[id number] DOB: [birth year] DOA: 01/15/2022 PCP: [name], MD   Brief Narrative:  Shannon [name] is a 66 y.o. female with medical history significant of metastatic adenocarcinoma of lung, HIV, COPD, CVA, recent right AKA and right upper extremity abscess requiring I&D was sent to [facility] on 01/14/22 for rehab comes back to the ER due to worsening swelling of the right upper extremity and some drainage. Patient had prolonged hospitalization here from 4/26 - 01/14/2022 for necrotizing fasciitis of the right foot requiring AKA on 5/5 also complicated by right forearm infection requiring fasciotomy and biopsy by hand surgery.  At that time.  Course was complicated by subacute CVA, fungemia, hypoxic respiratory failure.  Patient was also on Xarelto at the time of discharge.  She also had a wound VAC which was removed prior to her discharge.  She did complete her antibiotic course in the hospital.   After returning to her facility they noted worsening of right upper extremity swelling with some serosanguineous discharge and swelling therefore sent back to the hospital.   In the ER patient was noted to have right upper extremity swelling, slight tachycardia.  WBC was overall unremarkable.  Orthopedic/hand surgeon; Dr Greta Doom, was consulted by ED provider who will see the patient in the ED today.  She was started on IV vancomycin, cefepime and Flagyl.   Pt was seen by ortho who did not feel an abscess  was developing, admitted by hospitalist for further tx with IV abx   Assessment & Plan:   Principal Problem:   Cellulitis of right arm Active Problems:   HIV disease ([place])   Uncontrolled hypertension   GERD (gastroesophageal reflux disease)   Chronic kidney disease (CKD), stage III (moderate) (HCC)   CAD (coronary artery disease)   Atrial flutter (HCC)   (HFpEF) heart failure with preserved ejection fraction ([place])   Malignant neoplasm metastatic to  bone (HCC)   Cellulitis   Right upper extremity cellulitis with concerns of serosanguineous/purulent discharge - Cultures ordered.  Started on broad-spectrum antibiotics-IV vancomycin, cefepime and Flagyl.  Orthopedic consulted and saw pt in ER.   Xarelto will be held another day to make sure draining wound remains stable in case she needs surgical intervention. -Recently underwent fascia biopsy and fasciotomy which showed fibroadipose tissue.   Right foot abscess with necrotizing fasciitis status post AKA - AKA performed 5/5.  Completed antibiotic course for this.  Wound VAC was removed and was supposed to follow-up with Dr. Sharol Given in 1 week.. No dehis noted   Recent CVA of right anterior temporal lobe - Was on Xarelto outpatient.  Neurology recommended repeat MRI brain on 02/01/2022.   HIV disease - CD4 count 68.  On Biktarvy.   Diabetes mellitus type 2, insulin-dependent - A1c 7.3 on 12/17/2021.  On Decadron.  Continue Levemir, sliding scale and Accu-Cheks.   CKD stage IIb - Creatinine around baseline, at 0.87   Essential hypertension -Home antihypertensives on hold except Coreg due to soft blood pressure.   Congestive heart failure with preserved ejection fraction - Continue Coreg.  Gentle hydration due to signs of clinical dehydration.  Monitor her volume status.  Aldactone, hydralazine on hold   History of atrial flutter -Xarelto on hold, monitor wound if no surgery restart sat.  Continue Coreg.   GERD - PPI   Morbid obesity with BMI greater than 35 - Recently seen by dietitian   COPD -as needed bronchodilators.  Probably  also has OSA, will order CPAP at bedtime   Recent fungemia - Completed Diflucan on 5/15.  Seen by ID and ophthalmology.     DVT prophylaxis:  Code Status: Full code     Code Status Orders  (From admission, onward)           Start     Ordered   01/15/22 1732  Full code  Continuous        01/15/22 1731           Code Status History      Date Active Date Inactive Code Status Order ID Comments User Context   12/17/2021 1737 01/14/2022 1936 Full Code [id number]  Darliss Cheney, MD ED   02/13/2020 1730 02/17/2020 1906 Full Code [id number]  [name], MD Inpatient   02/03/2019 1712 02/06/2019 2200 Full Code [id number]  Lars Mage, MD ED   03/23/2018 0615 03/26/2018 1925 Full Code [id number]  Neva Seat, MD ED   11/02/2017 1912 11/04/2017 1642 Full Code [id number]  Alphonzo Grieve, MD ED   01/28/2017 1102 01/29/2017 2139 Full Code [id number]  Riccardo Dubin, MD Inpatient   01/05/2017 1326 01/06/2017 2247 Full Code [id number]  Milagros Loll, MD ED   09/18/2016 2007 09/19/2016 1723 Full Code [id number]  Jule Ser, DO Inpatient   07/09/2015 1043 07/11/2015 2233 Full Code [id number]  Jones Bales, MD Inpatient      Advance Directive Documentation    Flowsheet Row Most Recent Value  Type of Advance Directive Healthcare Power of [place], Living will  [per pt]  Pre-existing out of facility DNR order (yellow form or pink MOST form) --  "MOST" Form in Place? --      Family Communication: discusswed with sister by phone   NO PROXY FORM ON CHART.  DISCUSSED WITH TX TEAM WHO WILL CONTACT SW RE PLACEMENT OF PROXY FORM  Disposition Plan:  pt will continue iv abx, not stable for discharge  Consults called: None Admission status: Inpatient   Consultants:  ortho  Procedures:  DG Forearm Right  Result Date: 01/15/2022 CLINICAL DATA:  Status post right arm irrigation and debridement. Right arm is oozing blood. EXAM: RIGHT FOREARM - 2 VIEW COMPARISON:  CT right forearm 12/19/2021 FINDINGS: Normal bone mineralization. No cortical erosion to indicate radiographic evidence of acute osteomyelitis. There is moderate proximal volar forearm soft tissue swelling. No subcutaneous air is seen. Mild medial elbow joint space narrowing and peripheral osteophytosis. No acute fracture or dislocation. IMPRESSION: 1. Proximal volar forearm  soft tissue swelling. No subcutaneous air. 2. No cortical erosion to indicate radiographic evidence of acute osteomyelitis. Electronically Signed   By: Yvonne Kendall M.D.   On: 01/15/2022 16:23   DG Abd 1 View  Result Date: 12/20/2021 CLINICAL DATA:  Feeding tube placement. EXAM: ABDOMEN - 1 VIEW COMPARISON:  None. FINDINGS: A nasogastric tube is seen with its distal tip overlying the expected region of the gastric antrum. The bowel gas pattern is normal. No radio-opaque calculi or other significant radiographic abnormality are seen. An 8 mm soft tissue calcification is seen projecting over the mid left abdomen. This is confirmed to be extrarenal in location upon review of prior abdominal CT studies. IMPRESSION: Nasogastric tube positioning, as described above. Electronically Signed   By: Virgina Norfolk M.D.   On: 12/20/2021 18:14   MR BRAIN WO CONTRAST  Result Date: 01/04/2022 CLINICAL DATA:  Initial evaluation for delirium. History of metastatic lung carcinoma, recent history of infection. EXAM: MRI HEAD WITHOUT  CONTRAST TECHNIQUE: Multiplanar, multiecho pulse sequences of the brain and surrounding structures were obtained without intravenous contrast. COMPARISON:  Prior MRI from 11/03/2021. FINDINGS: Brain: Examination moderately to severely degraded by motion artifact. Generalized age-related cerebral atrophy. Scattered cerebral white matter disease, most characteristic of chronic microvascular ischemic disease, grossly similar to previous. Area of diffusion signal abnormality measuring 1.6 x 1.5 cm seen at the anterior right temporal lobe (series 5, image 67). This is also seen on coronal DWI (series 12, image 45). Associated signal loss on ADC map (series 6, image 16), with mild T2/FLAIR signal abnormality. Finding consistent with an evolving acute to early subacute ischemic infarct. No associated hemorrhage or significant mass effect. No other evidence for acute or subacute ischemia on this motion  degraded exam. Gray-white matter differentiation otherwise maintained. No visible acute intracranial hemorrhage. Punctate chronic microhemorrhage noted at the right thalamus. No visible mass lesion or mass effect. No midline shift or hydrocephalus. No visible extra-axial fluid collection. Susceptibility artifact related to prior right-sided craniotomy noted. Pituitary gland suprasellar region grossly normal. Vascular: Major intracranial vascular flow voids are grossly maintained on this motion degraded exam. Skull and upper cervical spine: Craniocervical junction grossly within normal limits. Heterogeneous and diffusely decreased signal intensity throughout the visualized bone marrow. Known metastatic lesion involving the right clivus without osseous expansion noted, similar to previous. Sinuses/Orbits: Globes and orbital soft tissues demonstrate no acute finding. Paranasal sinuses are largely clear. Trace bilateral mastoid effusions, of doubtful significance. Other: None. IMPRESSION: 1. Motion degraded exam. 2. 1.6 cm evolving acute to early subacute ischemic infarct involving the anterior right temporal lobe. No associated hemorrhage or significant mass effect. 3. Underlying age-related atrophy with chronic microvascular ischemic disease. No other acute intracranial abnormality. 4. Known metastatic disease involving the right clivus, similar to previous. Electronically Signed   By: Jeannine Boga M.D.   On: 01/04/2022 05:16   MR BRAIN W CONTRAST  Result Date: 01/05/2022 CLINICAL DATA:  Stroke follow-up. History of metastatic lung cancer and HIV. EXAM: MRI HEAD WITH CONTRAST TECHNIQUE: Multiplanar, multiecho pulse sequences of the brain and surrounding structures were obtained with intravenous contrast. CONTRAST:  30mL GADAVIST GADOBUTROL 1 MMOL/ML IV SOLN COMPARISON:  Noncontrast head MRI 01/03/2022 FINDINGS: The study is moderately to severely motion degraded despite attempts at repeat imaging. No  definite enhancement is identified in the anterior right temporal lobe at the site of restricted diffusion on the recent noncontrast MRI, and no definite abnormal intracranial enhancement is identified elsewhere although the degree of motion significantly reduces sensitivity for detection of small enhancing lesions. Prior right parietal craniotomy and a known right central skull base/clival metastasis are again noted. IMPRESSION: Motion degraded examination without definite abnormal intracranial enhancement. Electronically Signed   By: Logan Bores M.D.   On: 01/05/2022 14:37   CT FOREARM RIGHT WO CONTRAST  Result Date: 12/19/2021 CLINICAL DATA:  Soft tissue infection suspected. History of IV infiltration. EXAM: CT OF THE RIGHT FOREARM WITHOUT CONTRAST TECHNIQUE: Multidetector CT imaging was performed according to the standard protocol. Multiplanar CT image reconstructions were also generated. RADIATION DOSE REDUCTION: This exam was performed according to the departmental dose-optimization program which includes automated exposure control, adjustment of the mA and/or kV according to patient size and/or use of iterative reconstruction technique. COMPARISON:  None. FINDINGS: Bones/Joint/Cartilage Bones are normal in density. No evidence of fracture or osteonecrosis. Evaluation of wrist bones is limited due to motion artifact. Ligaments Suboptimally assessed by CT. Muscles and Tendons Flexor and extensor muscles of the  forearm or normal in bulk. No intramuscular fluid collection or abscess. Soft tissues There is marked skin thickening and subcutaneous soft tissue edema. There are multiple pockets of gas in the deep and superficial subcutaneous soft tissues of the anterior aspect of the forearm. The most prominent deep collection adjacent to the common extensor muscles measures approximately 0.8 x 3.2 by 3.4 cm. Multiple other 1-2 cm pockets of gas with marked surrounding edema highly suspicious for necrotizing  fasciitis. There are multiple skin blisters over the anterior aspect of the forearm measuring 2-3 cm. There is also marked edema and skin thickening about the extensor aspect of the forearm without evidence of gas or fluid collection. IMPRESSION: 1. Skin thickening and multiple pockets of superficial and deep subcutaneous gas in the anterior aspect of the proximal forearm with marked surrounding edema highly suspicious for necrotizing fasciitis. Correlate with physical examination findings. Follow-up examination with CT with contrast is recommended.\ 2. Multiple skin blisters about the anterior aspect of the forearm oral noted. 3. Muscles are normal in bulk. No intramuscular fluid collection or abscess. 4.  Osseous structures are unremarkable. Above findings were discussed with orthopedic team on-call. Electronically Signed   By: Keane Police D.O.   On: 12/19/2021 23:04   DG Chest Port 1 View  Result Date: 01/15/2022 CLINICAL DATA:  Sepsis. EXAM: PORTABLE CHEST 1 VIEW COMPARISON:  01/06/2022 FINDINGS: Cardiomegaly again noted. LEFT perihilar opacity with fiducial markers are unchanged. There is no evidence of airspace disease, pleural effusion, pneumothorax or acute bony abnormality. IMPRESSION: Cardiomegaly without evidence of acute cardiopulmonary disease. Electronically Signed   By: Margarette Canada M.D.   On: 01/15/2022 16:25   DG Chest Port 1 View  Result Date: 01/06/2022 CLINICAL DATA:  Hypoxia, atrial fibrillation. EXAM: PORTABLE CHEST 1 VIEW COMPARISON:  12/20/2021 and prior radiographs FINDINGS: Cardiomegaly again noted. A LEFT-sided PICC line is present with tip difficult to visualize but appears to overlie the LOWER SVC. A RIGHT PICC line is present with tip overlying the mid SVC. LEFT perihilar opacities with fiduciary markers are unchanged. There is no evidence of pneumothorax or pleural effusion. No acute bony abnormality identified. IMPRESSION: 1. No acute abnormalities. 2. Unchanged LEFT perihilar  opacities with fiduciary markers. 3. PICC lines as described Electronically Signed   By: Margarette Canada M.D.   On: 01/06/2022 16:01   DG CHEST PORT 1 VIEW  Result Date: 12/20/2021 CLINICAL DATA:  The endotracheal tube and feeding tube placement. EXAM: PORTABLE CHEST 1 VIEW COMPARISON:  December 20, 2021 FINDINGS: An endotracheal tube is seen with its distal tip approximately 2.5 cm from the carina. A nasogastric tube is noted with its distal end extending below the level of the diaphragm. There is stable left-sided PICC line positioning. The heart size and mediastinal contours are within normal limits. Mild atelectatic changes are seen within the bilateral lung bases. There is no evidence of a pleural effusion or pneumothorax. Radiopaque surgical clips are seen overlying the left lung base. Multilevel degenerative changes are seen throughout the thoracic spine. IMPRESSION: 1. Interval endotracheal tube placement and positioning, as described above. 2. Mild bibasilar atelectasis. Electronically Signed   By: Virgina Norfolk M.D.   On: 12/20/2021 18:11   DG CHEST PORT 1 VIEW  Result Date: 12/20/2021 CLINICAL DATA:  Hypoxia EXAM: PORTABLE CHEST 1 VIEW COMPARISON:  12/19/2021 FINDINGS: Interval placement of left upper extremity PICC line with distal tip terminating at the level of the distal SVC. Stable heart size. Unchanged lingular and left basilar  opacity with adjacent fiduciary markers. Right lung is clear. No pneumothorax. IMPRESSION: New left upper extremity PICC line.  Otherwise stable chest. Electronically Signed   By: Davina Poke D.O.   On: 12/20/2021 15:56   DG CHEST PORT 1 VIEW  Result Date: 12/19/2021 CLINICAL DATA:  Hypoxia EXAM: PORTABLE CHEST 1 VIEW COMPARISON:  Chest two views 12/17/2021, 12/02/2021, 11/10/2021; CT chest 12/02/2021 FINDINGS: Cardiac silhouette is again moderately enlarged. Mediastinal contours are unchanged. Mildly decreased lung volumes similar to prior.The right lung is  clear. There is again scarring within the lingula in the region of 3 surgical clips. No new focal airspace opacity. No definite pleural effusion. No pneumothorax. No acute skeletal abnormality. Bilateral posterior rib expansile lesions are again noted remaining concerning for osseous metastases. Note is made the patient has a history of recurrent left perihilar lung cancer. IMPRESSION: No significant change in left lingular scarring adjacent to fiducial markers in the region of left perihilar recurrent lung cancer. No acute lung process is visualized. Electronically Signed   By: Yvonne Kendall M.D.   On: 12/19/2021 14:06   DG Femur Min 2 Views Right  Result Date: 01/15/2022 CLINICAL DATA:  Recent RIGHT above the knee amputation. Redness at the amputation site reported by family. EXAM: RIGHT FEMUR 2 VIEWS COMPARISON:  None Available. FINDINGS: Limited evaluation is due to limited patient positioning and patient did not allow for further repositioning. RIGHT AKA changes are noted. No acute fracture, dislocation or acute osteomyelitis noted. IMPRESSION: 1. RIGHT AKA changes without acute bony abnormality or acute osteomyelitis. Electronically Signed   By: Margarette Canada M.D.   On: 01/15/2022 16:21   VAS [place] UPPER EXTREMITY VENOUS DUPLEX  Result Date: 12/19/2021 UPPER VENOUS STUDY  Patient Name:  SHIARA MCGOUGH  Date of Exam:   12/19/2021 Medical Rec #: [id number]       Accession #:    [id number] Date of Birth: 1955/11/03       Patient Gender: F Patient Age:   73 years Exam [facility]  [facility] Procedure:      VAS [place] UPPER EXTREMITY VENOUS DUPLEX Referring Phys: MICHAEL JEFFERY --------------------------------------------------------------------------------  Other Indications: Diffuse edema and blistering to RUE - previous IV placement. Limitations: Body habitus, poor ultrasound/tissue interface, patient movement and bandages. Comparison Study: No previous exams Performing Technologist: Jody Hill RVT, RDMS   Examination Guidelines: A complete evaluation includes B-mode imaging, spectral Doppler, color Doppler, and power Doppler as needed of all accessible portions of each vessel. Bilateral testing is considered an integral part of a complete examination. Limited examinations for reoccurring indications may be performed as noted.  Right Findings: +----------+------------+---------+-----------+----------+--------------------+ RIGHT     CompressiblePhasicitySpontaneousProperties      Summary        +----------+------------+---------+-----------+----------+--------------------+ IJV           Full       Yes       Yes                                   +----------+------------+---------+-----------+----------+--------------------+ Subclavian    Full       Yes       Yes                                   +----------+------------+---------+-----------+----------+--------------------+ Axillary      Full  Yes       Yes                                   +----------+------------+---------+-----------+----------+--------------------+ Brachial                 Yes       Yes                   patent by                                                              color/doppler     +----------+------------+---------+-----------+----------+--------------------+ Radial                                                 Not visualized    +----------+------------+---------+-----------+----------+--------------------+ Ulnar                                                  Not visualized    +----------+------------+---------+-----------+----------+--------------------+ Cephalic                                               Not visualized    +----------+------------+---------+-----------+----------+--------------------+ Basilic                                                Not visualized    +----------+------------+---------+-----------+----------+--------------------+  Bandage to entire forearm. Diffuse edema to RUE.  Left Findings: +----------+------------+---------+-----------+----------+--------------+ LEFT      CompressiblePhasicitySpontaneousProperties   Summary     +----------+------------+---------+-----------+----------+--------------+ Subclavian                                          Not visualized +----------+------------+---------+-----------+----------+--------------+ Patient uncooperative - unable to visualize left subclavian vein.  Summary:  Right: No evidence of deep vein thrombosis in the upper extremity. However, unable to visualize the cephalic, basilic, radial and ulnar veins.  *See table(s) above for measurements and observations.  Diagnosing physician: Orlie Pollen Electronically signed by Orlie Pollen on 12/19/2021 at 6:06:15 PM.    Final    [place] EKG SITE RITE  Result Date: 12/19/2021 If Site Rite image not attached, placement could not be confirmed due to current cardiac rhythm.   Antimicrobials:  Vanc cfpm flgyl    Subjective: Pulled out multiple Ivs overnight  Objective: Vitals:   01/16/22 1953 01/17/22 0815 01/17/22 1015 01/17/22 1200  BP: 132/76 (!) 149/95 130/81 130/75  Pulse:  (!) 103 98 76  Resp: 12  (!) 22 18  Temp: 99.2 F (37.3 C) 98.5 F (36.9 C) 98.1 F (36.7 C) 98.4 F (  36.9 C)  TempSrc:  Oral Oral Oral  SpO2: 98% 95% 92% 98%    Intake/Output Summary (Last 24 hours) at 01/17/2022 1515 Last data filed at 01/17/2022 0900 Gross per 24 hour  Intake 60 ml  Output 1450 ml  Net -1390 ml   There were no vitals filed for this visit.  Examination:  General exam: Labile, intermittently confused Respiratory system: Clear to auscultation. Respiratory effort normal. Cardiovascular system: S1 & S2 heard, RRR. No JVD, murmurs, rubs, gallops or clicks. No pedal edema. Gastrointestinal system: Abdomen is nondistended, soft and nontender. No organomegaly or masses felt. Normal bowel sounds heard. Central  nervous system: Alert and oriented. No focal neurological deficits. Extremities: right aka, well healed, rue no bleedign or purulent drainage Skin:as above, no other new lesions Psychiatry: labile , porr insight poor judgement     Data Reviewed: I have personally reviewed following labs and imaging studies  CBC: Recent Labs  Lab 01/12/22 1135 01/13/22 0500 01/15/22 1531 01/16/22 0934 01/17/22 0539  WBC 10.7* 9.3 9.2 7.5 7.9  NEUTROABS  --   --  8.3*  --  6.6  HGB 10.0* 9.4* 10.1* 9.4* 9.7*  HCT 31.9* 30.1* 32.9* 30.2* 30.9*  MCV 100.9* 101.3* 103.1* 104.1* 101.0*  PLT 139* 125* 106* 89* 79*   Basic Metabolic Panel: Recent Labs  Lab 01/13/22 0500 01/14/22 1211 01/15/22 1531 01/16/22 0934 01/17/22 0539  NA 142 144 145 143 141  K 4.5 4.6 4.9 4.3 3.7  CL 99 102 103 106 105  CO2 36* 35* 34* 34* 29  GLUCOSE 164* 171* 258* 94 66*  BUN 59* 52* 50* 41* 35*  CREATININE 1.06* 1.10* 1.15* 0.87 0.91  CALCIUM 10.3 10.2 10.2 9.3 9.4  MG  --  1.9  --  2.0 1.8   GFR: Estimated Creatinine Clearance: 66.5 mL/min (by C-G formula based on SCr of 0.91 mg/dL). Liver Function Tests: Recent Labs  Lab 01/11/22 0822 01/12/22 1135 01/13/22 0500 01/15/22 1531  AST 14* 19 17 26   ALT 25 31 38 44  ALKPHOS 72 80 79 84  BILITOT 0.6 0.7 0.9 0.5  PROT 5.3* 5.3* 5.2* 5.7*  ALBUMIN 2.7* 2.7* 2.7* 2.9*   No results for input(s): LIPASE, AMYLASE in the last 168 hours. No results for input(s): AMMONIA in the last 168 hours. Coagulation Profile: Recent Labs  Lab 01/15/22 1531  INR 1.1   Cardiac Enzymes: No results for input(s): CKTOTAL, CKMB, CKMBINDEX, TROPONINI in the last 168 hours. BNP (last 3 results) No results for input(s): PROBNP in the last 8760 hours. HbA1C: No results for input(s): HGBA1C in the last 72 hours. CBG: Recent Labs  Lab 01/16/22 1633 01/16/22 1957 01/17/22 0724 01/17/22 0807 01/17/22 1118  GLUCAP 112* 264* 67* 110* 136*   Lipid Profile: No results for  input(s): CHOL, HDL, LDLCALC, TRIG, CHOLHDL, LDLDIRECT in the last 72 hours. Thyroid Function Tests: No results for input(s): TSH, T4TOTAL, FREET4, T3FREE, THYROIDAB in the last 72 hours. Anemia Panel: No results for input(s): VITAMINB12, FOLATE, FERRITIN, TIBC, IRON, RETICCTPCT in the last 72 hours. Sepsis Labs: Recent Labs  Lab 01/15/22 1531  LATICACIDVEN 1.0    Recent Results (from the past 240 hour(s))  Blood Culture (routine x 2)     Status: None (Preliminary result)   Collection Time: 01/15/22  3:20 PM   Specimen: BLOOD  Result Value Ref Range Status   Specimen Description   Final    BLOOD BLOOD LEFT FOREARM Performed at [facility],  [place] [address], [place], [place] [address]    Special Requests   Final    BOTTLES DRAWN AEROBIC AND ANAEROBIC Blood Culture results may not be optimal due to an excessive volume of blood received in culture bottles Performed at [facility] [address] [place], [place] [address]    Culture   Final    NO GROWTH 2 DAYS Performed at [facility] [address], [place], [place] [address]    Report Status PENDING  Incomplete  Resp Panel by RT-PCR (Flu A&B, Covid) Anterior Nasal Swab     Status: None   Collection Time: 01/15/22  3:31 PM   Specimen: Anterior Nasal Swab  Result Value Ref Range Status   SARS Coronavirus 2 by RT PCR NEGATIVE NEGATIVE Final    Comment: (NOTE) SARS-CoV-2 target nucleic acids are NOT DETECTED.  The SARS-CoV-2 RNA is generally detectable in upper respiratory specimens during the acute phase of infection. The lowest concentration of SARS-CoV-2 viral copies this assay can detect is 138 copies/mL. A negative result does not preclude SARS-Cov-2 infection and should not be used as the sole basis for treatment or other patient management decisions. A negative result may occur with  improper specimen collection/handling, submission of specimen other than nasopharyngeal  swab, presence of viral mutation(s) within the areas targeted by this assay, and inadequate number of viral copies(<138 copies/mL). A negative result must be combined with clinical observations, patient history, and epidemiological information. The expected result is Negative.  Fact Sheet for Patients:  EntrepreneurPulse.com.au  Fact Sheet for Healthcare Providers:  IncredibleEmployment.be  This test is no t yet approved or cleared by the [place] FDA and  has been authorized for detection and/or diagnosis of SARS-CoV-2 by FDA under an Emergency Use Authorization (EUA). This EUA will remain  in effect (meaning this test can be used) for the duration of the COVID-19 declaration under Section 564(b)(1) of the Act, 21 U.S.C.section 360bbb-3(b)(1), unless the authorization is terminated  or revoked sooner.       Influenza A by PCR NEGATIVE NEGATIVE Final   Influenza B by PCR NEGATIVE NEGATIVE Final    Comment: (NOTE) The Xpert Xpress SARS-CoV-2/FLU/RSV plus assay is intended as an aid in the diagnosis of influenza from Nasopharyngeal swab specimens and should not be used as a sole basis for treatment. Nasal washings and aspirates are unacceptable for Xpert Xpress SARS-CoV-2/FLU/RSV testing.  Fact Sheet for Patients: EntrepreneurPulse.com.au  Fact Sheet for Healthcare Providers: IncredibleEmployment.be  This test is not yet approved or cleared by the [place] FDA and has been authorized for detection and/or diagnosis of SARS-CoV-2 by FDA under an Emergency Use Authorization (EUA). This EUA will remain in effect (meaning this test can be used) for the duration of the COVID-19 declaration under Section 564(b)(1) of the Act, 21 U.S.C. section 360bbb-3(b)(1), unless the authorization is terminated or revoked.  Performed at [facility], [place] [address], [place], [place] [address]    Blood Culture (routine x 2)     Status: None (Preliminary result)   Collection Time: 01/16/22  6:01 AM   Specimen: BLOOD LEFT FOREARM  Result Value Ref Range Status   Specimen Description   Final    BLOOD LEFT FOREARM Performed at [facility] [address] [place], [place] [address]    Special Requests   Final    BOTTLES DRAWN AEROBIC ONLY Blood Culture adequate volume Performed at [facility]  [address] [place], [place] [address]    Culture   Final    NO GROWTH 1 DAY Performed at [facility] [address] [place], [place] [address]    Report Status PENDING  Incomplete         Radiology Studies: DG Forearm Right  Result Date: 01/15/2022 CLINICAL DATA:  Status post right arm irrigation and debridement. Right arm is oozing blood. EXAM: RIGHT FOREARM - 2 VIEW COMPARISON:  CT right forearm 12/19/2021 FINDINGS: Normal bone mineralization. No cortical erosion to indicate radiographic evidence of acute osteomyelitis. There is moderate proximal volar forearm soft tissue swelling. No subcutaneous air is seen. Mild medial elbow joint space narrowing and peripheral osteophytosis. No acute fracture or dislocation. IMPRESSION: 1. Proximal volar forearm soft tissue swelling. No subcutaneous air. 2. No cortical erosion to indicate radiographic evidence of acute osteomyelitis. Electronically Signed   By: Yvonne Kendall M.D.   On: 01/15/2022 16:23   DG Chest Port 1 View  Result Date: 01/15/2022 CLINICAL DATA:  Sepsis. EXAM: PORTABLE CHEST 1 VIEW COMPARISON:  01/06/2022 FINDINGS: Cardiomegaly again noted. LEFT perihilar opacity with fiducial markers are unchanged. There is no evidence of airspace disease, pleural effusion, pneumothorax or acute bony abnormality. IMPRESSION: Cardiomegaly without evidence of acute cardiopulmonary disease. Electronically Signed   By: Margarette Canada M.D.   On: 01/15/2022 16:25   DG Femur Min 2 Views Right  Result  Date: 01/15/2022 CLINICAL DATA:  Recent RIGHT above the knee amputation. Redness at the amputation site reported by family. EXAM: RIGHT FEMUR 2 VIEWS COMPARISON:  None Available. FINDINGS: Limited evaluation is due to limited patient positioning and patient did not allow for further repositioning. RIGHT AKA changes are noted. No acute fracture, dislocation or acute osteomyelitis noted. IMPRESSION: 1. RIGHT AKA changes without acute bony abnormality or acute osteomyelitis. Electronically Signed   By: Margarette Canada M.D.   On: 01/15/2022 16:21        Scheduled Meds:  bictegravir-emtricitabine-tenofovir AF  1 tablet Oral Daily   carvedilol  12.5 mg Oral BID WC   docusate sodium  100 mg Oral Daily   enoxaparin (LOVENOX) injection  40 mg Subcutaneous Q24H   insulin aspart  0-5 Units Subcutaneous QHS   insulin aspart  0-9 Units Subcutaneous TID WC   insulin detemir  8 Units Subcutaneous QHS   pantoprazole  40 mg Oral Daily   Continuous Infusions:  ceFEPime (MAXIPIME) IV Stopped (01/17/22 0950)   metronidazole 500 mg (01/17/22 0533)   vancomycin       LOS: 1 day    Time spent: 26  min    Nicolette Bang, MD [facility]  If 7PM-7AM, please contact night-coverage  01/17/2022, 3:15 PM

## 2022-01-17 NOTE — Progress Notes (Signed)
   01/17/22 0815  Assess: MEWS Score  Temp 98.5 F (36.9 C)  BP (!) 149/95  Pulse Rate (!) 103  Level of Consciousness Alert  SpO2 95 %  O2 Device Nasal Cannula  O2 Flow Rate (L/min) 1 L/min  Assess: MEWS Score  MEWS Temp 0  MEWS Systolic 0  MEWS Pulse 1  MEWS RR 1  MEWS LOC 0  MEWS Score 2  MEWS Score Color Yellow  Assess: if the MEWS score is Yellow or Red  Were vital signs taken at a resting state? Yes  Focused Assessment No change from prior assessment  Does the patient meet 2 or more of the SIRS criteria? No  MEWS guidelines implemented *See Row Information* Yes  Treat  MEWS Interventions Escalated (See documentation below)  Pain Scale 0-10  Pain Score 0  Take Vital Signs  Increase Vital Sign Frequency  Yellow: Q 2hr X 2 then Q 4hr X 2, if remains yellow, continue Q 4hrs  Escalate  MEWS: Escalate Yellow: discuss with charge nurse/RN and consider discussing with provider and RRT  Notify: Charge Nurse/RN  Name of Charge Nurse/RN Notified Wendy, RN  Date Charge Nurse/RN Notified 01/17/22  Time Charge Nurse/RN Notified 0827  Notify: Provider  Provider Name/Title Dr. Wyonia Hough  Date Provider Notified 01/17/22  Time Provider Notified [phone]  Method of Notification Page  Notification Reason Other (Comment) (6 beat run of VTach)  Provider response Other (Comment) (awaiting response)  Date of Provider Response 01/17/22  Time of Provider Response [phone]  Document  Patient Outcome Not stable and remains on department  Progress note created (see row info) Yes  Assess: SIRS CRITERIA  SIRS Temperature  0  SIRS Pulse 1  SIRS Respirations  0  SIRS WBC 0  SIRS Score Sum  1   Pt in yellow MEWS. Pt had 6 beat run of V-Tach, per telemetry. Charge RN made aware. MD notified. Pt Aox4. However, pt is forgetful. Yellow MEWS implemented per protocol.

## 2022-01-17 NOTE — Progress Notes (Signed)
Telemetry notified this RN that patient had 6 beat run of V-Tach. Pt denies any SOB, chest pain, dizziness, and lightheadedness. BP 149/95. HR 103. Temp 98.5. SpO2 95% on 1L of oxygen. Pt is Aox4 at this time;  however pt is forgetful. MD Spongberg paged via [place].

## 2022-01-17 NOTE — Progress Notes (Signed)
Pharmacy Antibiotic Note  Shannon [name] is a 66 y.o. female admitted on 01/15/2022. Patient with recent infection requiring right AKA. She has also had right forearm I&D. Pharmacy has been consulted for vancomycin and cefepime dosing.  Plan: -Increase Vancomycin to 1g IV q24h -Increase Cefepime 2 g IV q8h -Continue to follow renal function, cultures, and clinical progress for antibiotic dosage adjustments and de-escalation as indicated. Per Ortho 5/25, no signs of infection based on clinical exam - narrow of d/c abx?    Temp (24hrs), Avg:98.8 F (37.1 C), Min:98.5 F (36.9 C), Max:99.2 F (37.3 C)  Recent Labs  Lab 01/12/22 1135 01/13/22 0500 01/14/22 1211 01/15/22 1531 01/16/22 0934 01/17/22 0539  WBC 10.7* 9.3  --  9.2 7.5 7.9  CREATININE 1.17* 1.06* 1.10* 1.15* 0.87 0.91  LATICACIDVEN  --   --   --  1.0  --   --      Estimated Creatinine Clearance: 66.5 mL/min (by C-G formula based on SCr of 0.91 mg/dL).    Allergies  Allergen Reactions   Lisinopril Swelling and Cough    Face/throat swelling   Tree Extract Swelling and Other (See Comments)    Swelling to eyes   Augmentin [Amoxicillin-Pot Clavulanate] Other (See Comments)    Headache, dizzy   Ciprofloxacin Hives    Antimicrobials this admission: Cefepime 5/25 >> Vancomycin 5/25 >> Flagyl 5/25 >>  Dose adjustments this admission: N/A  Microbiology results: 5/25 BCx: ngtd   Thank you for allowing pharmacy to be a part of this patient's care.  Emiliano Dyer, PharmD, [place] Clinical Pharmacist Pharmacy: [phone] 01/17/2022 8:47 AM

## 2022-01-17 NOTE — Progress Notes (Signed)
Hypoglycemic Event  CBG: 67  Treatment: 8 oz juice/soda  Symptoms: None  Follow-up CBG: Time:0808 CBG Result:110 CBG not taken within 30 mins due to pt stating, "I need to take my time to drink".   Possible Reasons for Event: Inadequate meal intake  Comments/MD notified:MD Spongberg notified.     Shannon [name] Maryan Char

## 2022-01-18 DIAGNOSIS — L03113 Cellulitis of right upper limb: Secondary | ICD-10-CM | POA: Diagnosis not present

## 2022-01-18 DIAGNOSIS — N183 Chronic kidney disease, stage 3 unspecified: Secondary | ICD-10-CM | POA: Diagnosis not present

## 2022-01-18 DIAGNOSIS — I483 Typical atrial flutter: Secondary | ICD-10-CM | POA: Diagnosis not present

## 2022-01-18 DIAGNOSIS — I5033 Acute on chronic diastolic (congestive) heart failure: Secondary | ICD-10-CM | POA: Diagnosis not present

## 2022-01-18 LAB — GLUCOSE, CAPILLARY
Glucose-Capillary: 77 mg/dL (ref 70–99)
Glucose-Capillary: 131 mg/dL — ABNORMAL HIGH (ref 70–99)
Glucose-Capillary: 77 mg/dL (ref 70–99)
Glucose-Capillary: 214 mg/dL — ABNORMAL HIGH (ref 70–99)

## 2022-01-18 MED ORDER — RIVAROXABAN 20 MG PO TABS
20.0000 mg | ORAL_TABLET | Freq: Every day | ORAL | Status: DC
Start: 2022-01-18 — End: 2022-01-23
  Administered 2022-01-18 – 2022-01-22 (×5): 20 mg via ORAL
  Filled 2022-01-18 (×5): qty 1

## 2022-01-18 MED ORDER — HYDRALAZINE HCL 25 MG PO TABS
25.0000 mg | ORAL_TABLET | Freq: Three times a day (TID) | ORAL | Status: DC
  Administered 2022-01-18 – 2022-01-23 (×15): 25 mg via ORAL
  Filled 2022-01-18 (×15): qty 1

## 2022-01-18 NOTE — Progress Notes (Signed)
PROGRESS NOTE    Shannon [name]  KZS:[id number] DOB: 06/15/1956 DOA: 01/15/2022 PCP: [name], MD   Brief Narrative:  Shannon [name] is a 66 y.o. female with medical history significant of metastatic adenocarcinoma of lung, HIV, COPD, CVA, recent right AKA and right upper extremity abscess requiring I&D was sent to [facility] on 01/14/22 for rehab comes back to the ER due to worsening swelling of the right upper extremity and some drainage. Patient had prolonged hospitalization here from 4/26 - 01/14/2022 for necrotizing fasciitis of the right foot requiring AKA on 5/5 also complicated by right forearm infection requiring fasciotomy and biopsy by hand surgery.  At that time.  Course was complicated by subacute CVA, fungemia, hypoxic respiratory failure.  Patient was also on Xarelto at the time of discharge.  She also had a wound VAC which was removed prior to her discharge.  She did complete her antibiotic course in the hospital.   After returning to her facility they noted worsening of right upper extremity swelling with some serosanguineous discharge and swelling therefore sent to Er for evaluation.   In the ER patient was noted to have right upper extremity swelling, slight tachycardia.  WBC was overall unremarkable.  Orthopedic/hand surgeon; Dr Greta Doom, was consulted by ED provider and saw the patient in the ED.  Ortho did not feel there was an abscess of surgical indication but agreed with aggressive abx for cellulitis.  She was started on IV vancomycin, cefepime and Flagyl.      Assessment & Plan:   Principal Problem:   Cellulitis of right arm Active Problems:   HIV disease ([place])   Uncontrolled hypertension   GERD (gastroesophageal reflux disease)   Chronic kidney disease (CKD), stage III (moderate) (HCC)   CAD (coronary artery disease)   Atrial flutter (HCC)   (HFpEF) heart failure with preserved ejection fraction (HCC)   Malignant neoplasm metastatic to bone (HCC)    Cellulitis  Right upper extremity cellulitis with concerns of serosanguineous/purulent discharge - Cultures NTD -- Started on broad-spectrum antibiotics-IV vancomycin, cefepime and Flagyl. -- Consider narrowing in AM if patient continues to do well and cx remain neg --  Orthopedic consulted and saw pt in ER-not a surgical case --  Xarelto initially held for possible surgery, but has been restarted - As noted, Recently underwent fascia biopsy and fasciotomy which showed fibroadipose tissue.   Right foot abscess with necrotizing fasciitis status post AKA - AKA performed 5/5.   -- Completed antibiotic course for this.   -- Wound VAC was removed and was supposed to follow-up with Dr. Sharol Given in 1 week..  -- Healing well, no dehis noted   Recent CVA of right anterior temporal lobe - Was on Xarelto outpatient.  Neurology recommended repeat MRI brain on 02/01/2022.   HIV disease - CD4 count 68.  On Biktarvy.   Diabetes mellitus type 2, insulin-dependent - A1c 7.3 on 12/17/2021.  On Decadron.  Continue Levemir, sliding scale and Accu-Cheks.   CKD stage IIb - Creatinine around baseline, at 0.91   Essential hypertension -Home antihypertensives were on hold except Coreg due to soft blood pressure. --will titrate back  in slowly, hydralazine today   Congestive heart failure with preserved ejection fraction - Continue Coreg.   --received gentle hydration due to signs of clinical dehydration, now off --Monitor her volume status.  Aldactone remains on hold   History of atrial flutter - restarted xarelto -- Continue Coreg.   GERD - PPI  Morbid obesity with BMI greater than 35 - Recently seen by dietitian   COPD -as needed bronchodilators.  Probably also has OSA, will order CPAP at bedtime   Recent fungemia - Completed Diflucan on 5/15.  Seen by ID and ophthalmology.     DVT prophylaxis: xarelto Code Status: Full code    Code Status Orders  (From admission, onward)            Start     Ordered   01/15/22 1732  Full code  Continuous        01/15/22 1731           Code Status History     Date Active Date Inactive Code Status Order ID Comments User Context   12/17/2021 1737 01/14/2022 1936 Full Code [id number]  Darliss Cheney, MD ED   02/13/2020 1730 02/17/2020 1906 Full Code [id number]  [name], MD Inpatient   02/03/2019 1712 02/06/2019 2200 Full Code [id number]  Lars Mage, MD ED   03/23/2018 0615 03/26/2018 1925 Full Code [id number]  Neva Seat, MD ED   11/02/2017 1912 11/04/2017 1642 Full Code [id number]  Alphonzo Grieve, MD ED   01/28/2017 1102 01/29/2017 2139 Full Code [id number]  Riccardo Dubin, MD Inpatient   01/05/2017 1326 01/06/2017 2247 Full Code [id number]  Milagros Loll, MD ED   09/18/2016 2007 09/19/2016 1723 Full Code [id number]  Jule Ser, DO Inpatient   07/09/2015 1043 07/11/2015 2233 Full Code [id number]  Jones Bales, MD Inpatient      Advance Directive Documentation    Flowsheet Row Most Recent Value  Type of Advance Directive Healthcare Power of Attorney, Living will  [per pt]  Pre-existing out of facility DNR order (yellow form or pink MOST form) --  "MOST" Form in Place? --      Family Communication: discussed with sister sat pm, no new changes  Disposition Plan: Patient not stable for discharge, continue IV antibiotics for cellulitis status post fasciotomy by upper extremity  consults called: None Admission status: Inpatient   Consultants:  ortho  Procedures:  DG Forearm Right  Result Date: 01/15/2022 CLINICAL DATA:  Status post right arm irrigation and debridement. Right arm is oozing blood. EXAM: RIGHT FOREARM - 2 VIEW COMPARISON:  CT right forearm 12/19/2021 FINDINGS: Normal bone mineralization. No cortical erosion to indicate radiographic evidence of acute osteomyelitis. There is moderate proximal volar forearm soft tissue swelling. No subcutaneous air is seen. Mild medial elbow joint space narrowing and  peripheral osteophytosis. No acute fracture or dislocation. IMPRESSION: 1. Proximal volar forearm soft tissue swelling. No subcutaneous air. 2. No cortical erosion to indicate radiographic evidence of acute osteomyelitis. Electronically Signed   By: Yvonne Kendall M.D.   On: 01/15/2022 16:23   DG Abd 1 View  Result Date: 12/20/2021 CLINICAL DATA:  Feeding tube placement. EXAM: ABDOMEN - 1 VIEW COMPARISON:  None. FINDINGS: A nasogastric tube is seen with its distal tip overlying the expected region of the gastric antrum. The bowel gas pattern is normal. No radio-opaque calculi or other significant radiographic abnormality are seen. An 8 mm soft tissue calcification is seen projecting over the mid left abdomen. This is confirmed to be extrarenal in location upon review of prior abdominal CT studies. IMPRESSION: Nasogastric tube positioning, as described above. Electronically Signed   By: Virgina Norfolk M.D.   On: 12/20/2021 18:14   MR BRAIN WO CONTRAST  Result Date: 01/04/2022 CLINICAL DATA:  Initial evaluation for delirium. History of metastatic lung carcinoma, recent  history of infection. EXAM: MRI HEAD WITHOUT CONTRAST TECHNIQUE: Multiplanar, multiecho pulse sequences of the brain and surrounding structures were obtained without intravenous contrast. COMPARISON:  Prior MRI from 11/03/2021. FINDINGS: Brain: Examination moderately to severely degraded by motion artifact. Generalized age-related cerebral atrophy. Scattered cerebral white matter disease, most characteristic of chronic microvascular ischemic disease, grossly similar to previous. Area of diffusion signal abnormality measuring 1.6 x 1.5 cm seen at the anterior right temporal lobe (series 5, image 67). This is also seen on coronal DWI (series 12, image 45). Associated signal loss on ADC map (series 6, image 16), with mild T2/FLAIR signal abnormality. Finding consistent with an evolving acute to early subacute ischemic infarct. No associated  hemorrhage or significant mass effect. No other evidence for acute or subacute ischemia on this motion degraded exam. Gray-white matter differentiation otherwise maintained. No visible acute intracranial hemorrhage. Punctate chronic microhemorrhage noted at the right thalamus. No visible mass lesion or mass effect. No midline shift or hydrocephalus. No visible extra-axial fluid collection. Susceptibility artifact related to prior right-sided craniotomy noted. Pituitary gland suprasellar region grossly normal. Vascular: Major intracranial vascular flow voids are grossly maintained on this motion degraded exam. Skull and upper cervical spine: Craniocervical junction grossly within normal limits. Heterogeneous and diffusely decreased signal intensity throughout the visualized bone marrow. Known metastatic lesion involving the right clivus without osseous expansion noted, similar to previous. Sinuses/Orbits: Globes and orbital soft tissues demonstrate no acute finding. Paranasal sinuses are largely clear. Trace bilateral mastoid effusions, of doubtful significance. Other: None. IMPRESSION: 1. Motion degraded exam. 2. 1.6 cm evolving acute to early subacute ischemic infarct involving the anterior right temporal lobe. No associated hemorrhage or significant mass effect. 3. Underlying age-related atrophy with chronic microvascular ischemic disease. No other acute intracranial abnormality. 4. Known metastatic disease involving the right clivus, similar to previous. Electronically Signed   By: Jeannine Boga M.D.   On: 01/04/2022 05:16   MR BRAIN W CONTRAST  Result Date: 01/05/2022 CLINICAL DATA:  Stroke follow-up. History of metastatic lung cancer and HIV. EXAM: MRI HEAD WITH CONTRAST TECHNIQUE: Multiplanar, multiecho pulse sequences of the brain and surrounding structures were obtained with intravenous contrast. CONTRAST:  75mL GADAVIST GADOBUTROL 1 MMOL/ML IV SOLN COMPARISON:  Noncontrast head MRI 01/03/2022  FINDINGS: The study is moderately to severely motion degraded despite attempts at repeat imaging. No definite enhancement is identified in the anterior right temporal lobe at the site of restricted diffusion on the recent noncontrast MRI, and no definite abnormal intracranial enhancement is identified elsewhere although the degree of motion significantly reduces sensitivity for detection of small enhancing lesions. Prior right parietal craniotomy and a known right central skull base/clival metastasis are again noted. IMPRESSION: Motion degraded examination without definite abnormal intracranial enhancement. Electronically Signed   By: Logan Bores M.D.   On: 01/05/2022 14:37   CT FOREARM RIGHT WO CONTRAST  Result Date: 12/19/2021 CLINICAL DATA:  Soft tissue infection suspected. History of IV infiltration. EXAM: CT OF THE RIGHT FOREARM WITHOUT CONTRAST TECHNIQUE: Multidetector CT imaging was performed according to the standard protocol. Multiplanar CT image reconstructions were also generated. RADIATION DOSE REDUCTION: This exam was performed according to the departmental dose-optimization program which includes automated exposure control, adjustment of the mA and/or kV according to patient size and/or use of iterative reconstruction technique. COMPARISON:  None. FINDINGS: Bones/Joint/Cartilage Bones are normal in density. No evidence of fracture or osteonecrosis. Evaluation of wrist bones is limited due to motion artifact. Ligaments Suboptimally assessed by CT. Muscles and  Tendons Flexor and extensor muscles of the forearm or normal in bulk. No intramuscular fluid collection or abscess. Soft tissues There is marked skin thickening and subcutaneous soft tissue edema. There are multiple pockets of gas in the deep and superficial subcutaneous soft tissues of the anterior aspect of the forearm. The most prominent deep collection adjacent to the common extensor muscles measures approximately 0.8 x 3.2 by 3.4 cm.  Multiple other 1-2 cm pockets of gas with marked surrounding edema highly suspicious for necrotizing fasciitis. There are multiple skin blisters over the anterior aspect of the forearm measuring 2-3 cm. There is also marked edema and skin thickening about the extensor aspect of the forearm without evidence of gas or fluid collection. IMPRESSION: 1. Skin thickening and multiple pockets of superficial and deep subcutaneous gas in the anterior aspect of the proximal forearm with marked surrounding edema highly suspicious for necrotizing fasciitis. Correlate with physical examination findings. Follow-up examination with CT with contrast is recommended.\ 2. Multiple skin blisters about the anterior aspect of the forearm oral noted. 3. Muscles are normal in bulk. No intramuscular fluid collection or abscess. 4.  Osseous structures are unremarkable. Above findings were discussed with orthopedic team on-call. Electronically Signed   By: Keane Police D.O.   On: 12/19/2021 23:04   DG Chest Port 1 View  Result Date: 01/15/2022 CLINICAL DATA:  Sepsis. EXAM: PORTABLE CHEST 1 VIEW COMPARISON:  01/06/2022 FINDINGS: Cardiomegaly again noted. LEFT perihilar opacity with fiducial markers are unchanged. There is no evidence of airspace disease, pleural effusion, pneumothorax or acute bony abnormality. IMPRESSION: Cardiomegaly without evidence of acute cardiopulmonary disease. Electronically Signed   By: Margarette Canada M.D.   On: 01/15/2022 16:25   DG Chest Port 1 View  Result Date: 01/06/2022 CLINICAL DATA:  Hypoxia, atrial fibrillation. EXAM: PORTABLE CHEST 1 VIEW COMPARISON:  12/20/2021 and prior radiographs FINDINGS: Cardiomegaly again noted. A LEFT-sided PICC line is present with tip difficult to visualize but appears to overlie the LOWER SVC. A RIGHT PICC line is present with tip overlying the mid SVC. LEFT perihilar opacities with fiduciary markers are unchanged. There is no evidence of pneumothorax or pleural effusion. No  acute bony abnormality identified. IMPRESSION: 1. No acute abnormalities. 2. Unchanged LEFT perihilar opacities with fiduciary markers. 3. PICC lines as described Electronically Signed   By: Margarette Canada M.D.   On: 01/06/2022 16:01   DG CHEST PORT 1 VIEW  Result Date: 12/20/2021 CLINICAL DATA:  The endotracheal tube and feeding tube placement. EXAM: PORTABLE CHEST 1 VIEW COMPARISON:  December 20, 2021 FINDINGS: An endotracheal tube is seen with its distal tip approximately 2.5 cm from the carina. A nasogastric tube is noted with its distal end extending below the level of the diaphragm. There is stable left-sided PICC line positioning. The heart size and mediastinal contours are within normal limits. Mild atelectatic changes are seen within the bilateral lung bases. There is no evidence of a pleural effusion or pneumothorax. Radiopaque surgical clips are seen overlying the left lung base. Multilevel degenerative changes are seen throughout the thoracic spine. IMPRESSION: 1. Interval endotracheal tube placement and positioning, as described above. 2. Mild bibasilar atelectasis. Electronically Signed   By: Virgina Norfolk M.D.   On: 12/20/2021 18:11   DG CHEST PORT 1 VIEW  Result Date: 12/20/2021 CLINICAL DATA:  Hypoxia EXAM: PORTABLE CHEST 1 VIEW COMPARISON:  12/19/2021 FINDINGS: Interval placement of left upper extremity PICC line with distal tip terminating at the level of the distal SVC. Stable  heart size. Unchanged lingular and left basilar opacity with adjacent fiduciary markers. Right lung is clear. No pneumothorax. IMPRESSION: New left upper extremity PICC line.  Otherwise stable chest. Electronically Signed   By: Davina Poke D.O.   On: 12/20/2021 15:56   DG Femur Min 2 Views Right  Result Date: 01/15/2022 CLINICAL DATA:  Recent RIGHT above the knee amputation. Redness at the amputation site reported by family. EXAM: RIGHT FEMUR 2 VIEWS COMPARISON:  None Available. FINDINGS: Limited evaluation  is due to limited patient positioning and patient did not allow for further repositioning. RIGHT AKA changes are noted. No acute fracture, dislocation or acute osteomyelitis noted. IMPRESSION: 1. RIGHT AKA changes without acute bony abnormality or acute osteomyelitis. Electronically Signed   By: Margarette Canada M.D.   On: 01/15/2022 16:21   VAS [place] UPPER EXTREMITY VENOUS DUPLEX  Result Date: 12/19/2021 UPPER VENOUS STUDY  Patient Name:  NATHAN STALLWORTH  Date of Exam:   12/19/2021 Medical Rec #: [id number]       Accession #:    [id number] Date of Birth: 05-Nov-1955       Patient Gender: F Patient Age:   56 years Exam Location:  [facility] Procedure:      VAS [place] UPPER EXTREMITY VENOUS DUPLEX Referring Phys: MICHAEL JEFFERY --------------------------------------------------------------------------------  Other Indications: Diffuse edema and blistering to RUE - previous IV placement. Limitations: Body habitus, poor ultrasound/tissue interface, patient movement and bandages. Comparison Study: No previous exams Performing Technologist: Jody Hill RVT, RDMS  Examination Guidelines: A complete evaluation includes B-mode imaging, spectral Doppler, color Doppler, and power Doppler as needed of all accessible portions of each vessel. Bilateral testing is considered an integral part of a complete examination. Limited examinations for reoccurring indications may be performed as noted.  Right Findings: +----------+------------+---------+-----------+----------+--------------------+ RIGHT     CompressiblePhasicitySpontaneousProperties      Summary        +----------+------------+---------+-----------+----------+--------------------+ IJV           Full       Yes       Yes                                   +----------+------------+---------+-----------+----------+--------------------+ Subclavian    Full       Yes       Yes                                    +----------+------------+---------+-----------+----------+--------------------+ Axillary      Full       Yes       Yes                                   +----------+------------+---------+-----------+----------+--------------------+ Brachial                 Yes       Yes                   patent by  color/doppler     +----------+------------+---------+-----------+----------+--------------------+ Radial                                                 Not visualized    +----------+------------+---------+-----------+----------+--------------------+ Ulnar                                                  Not visualized    +----------+------------+---------+-----------+----------+--------------------+ Cephalic                                               Not visualized    +----------+------------+---------+-----------+----------+--------------------+ Basilic                                                Not visualized    +----------+------------+---------+-----------+----------+--------------------+ Bandage to entire forearm. Diffuse edema to RUE.  Left Findings: +----------+------------+---------+-----------+----------+--------------+ LEFT      CompressiblePhasicitySpontaneousProperties   Summary     +----------+------------+---------+-----------+----------+--------------+ Subclavian                                          Not visualized +----------+------------+---------+-----------+----------+--------------+ Patient uncooperative - unable to visualize left subclavian vein.  Summary:  Right: No evidence of deep vein thrombosis in the upper extremity. However, unable to visualize the cephalic, basilic, radial and ulnar veins.  *See table(s) above for measurements and observations.  Diagnosing physician: Orlie Pollen Electronically signed by Orlie Pollen on 12/19/2021 at 6:06:15 PM.    Final      Antimicrobials:  vanc Flagyl and cefepime   Subjective: No acute decompensation has not been pulling out IVs today Appears more stable and appropriate  Objective: Vitals:   01/17/22 1553 01/17/22 1915 01/17/22 2007 01/18/22 0442  BP: (!) 155/92  (!) 158/98 (!) 154/92  Pulse: 86  71 84  Resp: 16  20 16   Temp: 98.3 F (36.8 C)  98.8 F (37.1 C) 98.9 F (37.2 C)  TempSrc: Oral  Oral Oral  SpO2: 98%  99% 99%  Weight:  91.1 kg    Height:  5\' 4"  (1.626 m)      Intake/Output Summary (Last 24 hours) at 01/18/2022 1351 Last data filed at 01/18/2022 1200 Gross per 24 hour  Intake 1764.67 ml  Output 1050 ml  Net 714.67 ml   Filed Weights   01/17/22 1915  Weight: 91.1 kg    Examination:  General exam: More stable, less labile, intermittently confused Respiratory system: Clear to auscultation. Respiratory effort normal. Cardiovascular system: S1 & S2 heard, RRR. No JVD, murmurs, rubs, gallops or clicks. No pedal edema. Gastrointestinal system: Abdomen is nondistended, soft and nontender. No organomegaly or masses felt. Normal bowel sounds heard. Central nervous system: Alert and oriented. No focal neurological deficits. Extremities: right aka, well healed, rue no bleedign or purulent drainage, mildly erythematous although improving Skin:as above, no other new lesions Psychiatry: Less labile , porr insight  poor judgement     Data Reviewed: I have personally reviewed following labs and imaging studies  CBC: Recent Labs  Lab 01/12/22 1135 01/13/22 0500 01/15/22 1531 01/16/22 0934 01/17/22 0539  WBC 10.7* 9.3 9.2 7.5 7.9  NEUTROABS  --   --  8.3*  --  6.6  HGB 10.0* 9.4* 10.1* 9.4* 9.7*  HCT 31.9* 30.1* 32.9* 30.2* 30.9*  MCV 100.9* 101.3* 103.1* 104.1* 101.0*  PLT 139* 125* 106* 89* 79*   Basic Metabolic Panel: Recent Labs  Lab 01/13/22 0500 01/14/22 1211 01/15/22 1531 01/16/22 0934 01/17/22 0539  NA 142 144 145 143 141  K 4.5 4.6 4.9 4.3 3.7  CL 99 102  103 106 105  CO2 36* 35* 34* 34* 29  GLUCOSE 164* 171* 258* 94 66*  BUN 59* 52* 50* 41* 35*  CREATININE 1.06* 1.10* 1.15* 0.87 0.91  CALCIUM 10.3 10.2 10.2 9.3 9.4  MG  --  1.9  --  2.0 1.8   GFR: Estimated Creatinine Clearance: 65.6 mL/min (by C-G formula based on SCr of 0.91 mg/dL). Liver Function Tests: Recent Labs  Lab 01/12/22 1135 01/13/22 0500 01/15/22 1531  AST 19 17 26   ALT 31 38 44  ALKPHOS 80 79 84  BILITOT 0.7 0.9 0.5  PROT 5.3* 5.2* 5.7*  ALBUMIN 2.7* 2.7* 2.9*   No results for input(s): LIPASE, AMYLASE in the last 168 hours. No results for input(s): AMMONIA in the last 168 hours. Coagulation Profile: Recent Labs  Lab 01/15/22 1531  INR 1.1   Cardiac Enzymes: No results for input(s): CKTOTAL, CKMB, CKMBINDEX, TROPONINI in the last 168 hours. BNP (last 3 results) No results for input(s): PROBNP in the last 8760 hours. HbA1C: No results for input(s): HGBA1C in the last 72 hours. CBG: Recent Labs  Lab 01/17/22 1118 01/17/22 1554 01/17/22 2138 01/18/22 0713 01/18/22 1129  GLUCAP 136* 97 97 77 131*   Lipid Profile: No results for input(s): CHOL, HDL, LDLCALC, TRIG, CHOLHDL, LDLDIRECT in the last 72 hours. Thyroid Function Tests: No results for input(s): TSH, T4TOTAL, FREET4, T3FREE, THYROIDAB in the last 72 hours. Anemia Panel: No results for input(s): VITAMINB12, FOLATE, FERRITIN, TIBC, IRON, RETICCTPCT in the last 72 hours. Sepsis Labs: Recent Labs  Lab 01/15/22 1531  LATICACIDVEN 1.0    Recent Results (from the past 240 hour(s))  Blood Culture (routine x 2)     Status: None (Preliminary result)   Collection Time: 01/15/22  3:20 PM   Specimen: BLOOD  Result Value Ref Range Status   Specimen Description   Final    BLOOD BLOOD LEFT FOREARM Performed at [facility], [place] [address], [place], [place] [address]    Special Requests   Final    BOTTLES DRAWN AEROBIC AND ANAEROBIC Blood Culture results may not be optimal due to  an excessive volume of blood received in culture bottles Performed at [place] [address] [place], [place] [address]    Culture   Final    NO GROWTH 2 DAYS Performed at [facility] [address], [place], [place] [address]    Report Status PENDING  Incomplete  Resp Panel by RT-PCR (Flu A&B, Covid) Anterior Nasal Swab     Status: None   Collection Time: 01/15/22  3:31 PM   Specimen: Anterior Nasal Swab  Result Value Ref Range Status   SARS Coronavirus 2 by RT PCR NEGATIVE NEGATIVE Final    Comment: (NOTE) SARS-CoV-2 target nucleic acids are  NOT DETECTED.  The SARS-CoV-2 RNA is generally detectable in upper respiratory specimens during the acute phase of infection. The lowest concentration of SARS-CoV-2 viral copies this assay can detect is 138 copies/mL. A negative result does not preclude SARS-Cov-2 infection and should not be used as the sole basis for treatment or other patient management decisions. A negative result may occur with  improper specimen collection/handling, submission of specimen other than nasopharyngeal swab, presence of viral mutation(s) within the areas targeted by this assay, and inadequate number of viral copies(<138 copies/mL). A negative result must be combined with clinical observations, patient history, and epidemiological information. The expected result is Negative.  Fact Sheet for Patients:  EntrepreneurPulse.com.au  Fact Sheet for Healthcare Providers:  IncredibleEmployment.be  This test is no t yet approved or cleared by the [place] FDA and  has been authorized for detection and/or diagnosis of SARS-CoV-2 by FDA under an Emergency Use Authorization (EUA). This EUA will remain  in effect (meaning this test can be used) for the duration of the COVID-19 declaration under Section 564(b)(1) of the Act, 21 U.S.C.section 360bbb-3(b)(1), unless the authorization is terminated   or revoked sooner.       Influenza A by PCR NEGATIVE NEGATIVE Final   Influenza B by PCR NEGATIVE NEGATIVE Final    Comment: (NOTE) The Xpert Xpress SARS-CoV-2/FLU/RSV plus assay is intended as an aid in the diagnosis of influenza from Nasopharyngeal swab specimens and should not be used as a sole basis for treatment. Nasal washings and aspirates are unacceptable for Xpert Xpress SARS-CoV-2/FLU/RSV testing.  Fact Sheet for Patients: EntrepreneurPulse.com.au  Fact Sheet for Healthcare Providers: IncredibleEmployment.be  This test is not yet approved or cleared by the [place] FDA and has been authorized for detection and/or diagnosis of SARS-CoV-2 by FDA under an Emergency Use Authorization (EUA). This EUA will remain in effect (meaning this test can be used) for the duration of the COVID-19 declaration under Section 564(b)(1) of the Act, 21 U.S.C. section 360bbb-3(b)(1), unless the authorization is terminated or revoked.  Performed at [facility], [place] [address] [place], [place] [address]   Blood Culture (routine x 2)     Status: None (Preliminary result)   Collection Time: 01/16/22  6:01 AM   Specimen: BLOOD LEFT FOREARM  Result Value Ref Range Status   Specimen Description   Final    BLOOD LEFT FOREARM Performed at [place] [address], [place], [place] [address]    Special Requests   Final    BOTTLES DRAWN AEROBIC ONLY Blood Culture adequate volume Performed at [place] [address], [place], [place] [address]    Culture   Final    NO GROWTH 1 DAY Performed at [facility], [place] [address], [place], [place] [address]    Report Status PENDING  Incomplete         Radiology Studies: No results found.      Scheduled Meds:  bictegravir-emtricitabine-tenofovir AF  1 tablet Oral Daily   carvedilol  12.5 mg Oral BID WC   chlorhexidine  15  mL Mouth Rinse BID   docusate sodium  100 mg Oral Daily   insulin aspart  0-5 Units Subcutaneous QHS   insulin aspart  0-9 Units Subcutaneous TID WC   insulin detemir  8 Units Subcutaneous QHS   mouth rinse  15 mL Mouth Rinse q12n4p   pantoprazole  40 mg Oral Daily   rivaroxaban  20 mg Oral  Q supper   Continuous Infusions:  ceFEPime (MAXIPIME) IV 2 g (01/18/22 0905)   metronidazole Stopped (01/18/22 0417)   vancomycin 1,000 mg (01/17/22 1814)     LOS: 2 days    Time spent: 55 min    Nicolette Bang, MD [facility]  If 7PM-7AM, please contact night-coverage  01/18/2022, 1:51 PM

## 2022-01-18 NOTE — Plan of Care (Signed)
  Problem: Clinical Measurements: Goal: Ability to maintain clinical measurements within normal limits will improve Outcome: Progressing   Problem: Nutrition: Goal: Adequate nutrition will be maintained Outcome: Progressing   Problem: Pain Managment: Goal: General experience of comfort will improve Outcome: Progressing   

## 2022-01-18 NOTE — Evaluation (Addendum)
Physical Therapy Evaluation Patient Details Name: Shannon [name] MRN: [id number] DOB: Oct 19, 1955 Today's Date: 01/18/2022  History of Present Illness  Shannon [name] is a 66 y.o. woman readmitted from [facility] care (after 12 hr stay following dc from [facility])  with cellulitis of R UE s/p recent I&D of abcess.  Pt with hx of metastatic non-small cell lung cancer, asthma/COPD, GERD, HTN, HIV, and R AKA 12/26/21.  Clinical Impression  Pt admitted as above and presenting with functional mobility limitations 2* generalized weakness, R LE and UE pain, balance deficits, very recent R AKA and decreased endurance.  Pt would benefit from follow up rehab at SNF level to maximize IND and safety.  This date, pt very cooperative but agreeable to EOB sitting only.  Pt up to EOB sitting x ~12 minutes and able to maintain balance sans use of UEs for short periods of time but fatigues easily and returns to propping on L elbow.     Recommendations for follow up therapy are one component of a multi-disciplinary discharge planning process, led by the attending physician.  Recommendations may be updated based on patient status, additional functional criteria and insurance authorization.  Follow Up Recommendations Skilled nursing-short term rehab (<3 hours/day)    Assistance Recommended at Discharge Frequent or constant Supervision/Assistance  Patient can return home with the following  Two people to help with walking and/or transfers;Assistance with cooking/housework;Assist for transportation;Help with stairs or ramp for entrance;Two people to help with bathing/dressing/bathroom;Direct supervision/assist for medications management    Equipment Recommendations None recommended by PT  Recommendations for Other Services       Functional Status Assessment Patient has had a recent decline in their functional status and demonstrates the ability to make significant improvements in function in a reasonable and predictable  amount of time.     Precautions / Restrictions Precautions Precautions: Fall Precaution Comments: R AKA, moves quick - be close Restrictions Weight Bearing Restrictions: Yes RLE Weight Bearing: Non weight bearing Other Position/Activity Restrictions: Recent R AKA      Mobility  Bed Mobility Overal bed mobility: Needs Assistance Bed Mobility: Supine to Sit, Sit to Supine, Rolling Rolling: Min assist   Supine to sit: Min assist, +2 for physical assistance, +2 for safety/equipment Sit to supine: Min assist, +2 for physical assistance, +2 for safety/equipment   General bed mobility comments: use of bed rails and use of bed pad to complete rotation to/from EOB    Transfers                   General transfer comment: Pt agreeable to EOB sitting only this date.  Pt up to EOB x 12 min and able to sit upright with no assist from UEs but fatigues easily and continuously returns to lean on L elbow    Ambulation/Gait               General Gait Details: currently non amb NEW AKA  Stairs            Wheelchair Mobility    Modified Rankin (Stroke Patients Only)       Balance Overall balance assessment: Needs assistance Sitting-balance support: No upper extremity supported Sitting balance-Leahy Scale: Fair Sitting balance - Comments: short periods only before leaning onto L elbow                                     Pertinent  Vitals/Pain Pain Assessment Pain Assessment: Faces Pain Score: 4  Pain Location: R arm and R LE residual limb phantom pain Pain Descriptors / Indicators: Sore Pain Intervention(s): Limited activity within patient's tolerance, Monitored during session    Home Living Family/patient expects to be discharged to:: Skilled nursing facility                        Prior Function Prior Level of Function : Independent/Modified Independent             Mobility Comments: prior to recent hospital admit        Hand Dominance   Dominant Hand: Right    Extremity/Trunk Assessment   Upper Extremity Assessment Upper Extremity Assessment: Defer to OT evaluation    Lower Extremity Assessment Lower Extremity Assessment: Generalized weakness;RLE deficits/detail RLE Deficits / Details: dressings in place R LE RLE: Unable to fully assess due to pain LLE Deficits / Details: Pt states to not touch residual limb    Cervical / Trunk Assessment Cervical / Trunk Assessment: Other exceptions;Normal Cervical / Trunk Exceptions: habitus  Communication   Communication: No difficulties  Cognition Arousal/Alertness: Awake/alert Behavior During Therapy: WFL for tasks assessed/performed Overall Cognitive Status: No family/caregiver present to determine baseline cognitive functioning Area of Impairment: Orientation, Memory, Problem solving                 Orientation Level: Disoriented to, Time, Situation   Memory: Decreased short-term memory Following Commands: Follows one step commands consistently     Problem Solving: Requires verbal cues, Requires tactile cues, Difficulty sequencing, Decreased initiation, Slow processing General Comments: Cues for focus on task, pt very cooperative but with noted tangential train of thought.        General Comments      Exercises     Assessment/Plan    PT Assessment Patient needs continued PT services  PT Problem List Decreased strength;Decreased mobility;Decreased safety awareness;Decreased range of motion;Decreased knowledge of precautions;Obesity;Decreased activity tolerance;Decreased balance;Pain       PT Treatment Interventions DME instruction;Therapeutic activities;Gait training;Therapeutic exercise;Patient/family education;Functional mobility training;Balance training;Neuromuscular re-education;Wheelchair mobility training    PT Goals (Current goals can be found in the Care Plan section)  Acute Rehab PT Goals Patient Stated Goal: get  better PT Goal Formulation: With patient Time For Goal Achievement: 02/01/22 Potential to Achieve Goals: Fair    Frequency Min 2X/week     Co-evaluation PT/OT/SLP Co-Evaluation/Treatment: Yes Reason for Co-Treatment: For patient/therapist safety;To address functional/ADL transfers PT goals addressed during session: Mobility/safety with mobility OT goals addressed during session: ADL's and self-care       AM-PAC PT "6 Clicks" Mobility  Outcome Measure Help needed turning from your back to your side while in a flat bed without using bedrails?: A Little Help needed moving from lying on your back to sitting on the side of a flat bed without using bedrails?: A Lot Help needed moving to and from a bed to a chair (including a wheelchair)?: A Lot Help needed standing up from a chair using your arms (e.g., wheelchair or bedside chair)?: Total Help needed to walk in hospital room?: Total Help needed climbing 3-5 steps with a railing? : Total 6 Click Score: 10    End of Session   Activity Tolerance: Patient tolerated treatment well;Patient limited by fatigue Patient left: in bed;with call bell/phone within reach;with bed alarm set Nurse Communication: Mobility status PT Visit Diagnosis: Unsteadiness on feet (R26.81);Other abnormalities of gait and mobility (R26.89);Muscle weakness (  generalized) (M62.81) Pain - Right/Left: Right Pain - part of body: Leg (and arm)    Time: 5732-2567 PT Time Calculation (min) (ACUTE ONLY): 32 min   Charges:   PT Evaluation $PT Eval Low Complexity: 1 Low          [place] Pager [phone] Office [phone]   Eulises Kijowski 01/18/2022, 3:37 PM

## 2022-01-18 NOTE — Evaluation (Signed)
Occupational Therapy Evaluation Patient Details Name: Shannon [name] MRN: [id number] DOB: February 26, 1956 Today's Date: 01/18/2022   History of Present Illness patient is a 66 year old female who was recently at this hospital from 4/26 to 5/24 with d/c to SNF for continuation of skilled services. patient presented with incresed edema and discharge from RUE. patient was admitted with cellulitis of right arm.  PMH: CVA, fungemia, hypoxic respiratory failure, AKA on 5/5, right forearm infection with fasciotomy and biopsy, HIV, CKD, DM II, CHF, atrial flutter, GERD, morbid obesity,   Clinical Impression   Patient is a 66 year old female who was readmitted from SNF for above. Patient was noted to have a long recent hospitalization with various complexities. Patient was noted to have continued decreased strength, decreased endurance,decreased sitting balance, decreased safety awareness, decreased functional activity tolerance, and decreased trunk control impacting participation in ADLs.  Patient would continue to benefit from skilled OT services at this time while admitted and after d/c to address noted deficits in order to improve overall safety and independence in ADLs.       Recommendations for follow up therapy are one component of a multi-disciplinary discharge planning process, led by the attending physician.  Recommendations may be updated based on patient status, additional functional criteria and insurance authorization.   Follow Up Recommendations  Skilled nursing-short term rehab (<3 hours/day)    Assistance Recommended at Discharge Frequent or constant Supervision/Assistance  Patient can return home with the following Assistance with cooking/housework;Direct supervision/assist for medications management;Direct supervision/assist for financial management;Assist for transportation;Help with stairs or ramp for entrance;Two people to help with walking and/or transfers;A lot of help with  bathing/dressing/bathroom    Functional Status Assessment  Patient has had a recent decline in their functional status and demonstrates the ability to make significant improvements in function in a reasonable and predictable amount of time.  Equipment Recommendations  Other (comment) (defer to next venue)    Recommendations for Other Services       Precautions / Restrictions Precautions Precautions: Fall Precaution Comments: R AKA, moves quick - be close Restrictions Weight Bearing Restrictions: Yes RLE Weight Bearing: Non weight bearing Other Position/Activity Restrictions: Recent R AKA      Mobility Bed Mobility Overal bed mobility: Needs Assistance Bed Mobility: Supine to Sit, Sit to Supine, Rolling Rolling: Min assist Sidelying to sit: HOB elevated, Min assist Supine to sit: Min assist, +2 for physical assistance, +2 for safety/equipment Sit to supine: Min assist, +2 for physical assistance, +2 for safety/equipment   General bed mobility comments: use of bed rails and use of bed pad to complete rotation to/from EOB    Transfers                          Balance Overall balance assessment: Needs assistance Sitting-balance support: No upper extremity supported Sitting balance-Leahy Scale: Fair Sitting balance - Comments: short periods only before leaning onto L elbow                                   ADL either performed or assessed with clinical judgement   ADL Overall ADL's : Needs assistance/impaired Eating/Feeding: Independent;Sitting   Grooming: Wash/dry face;Min guard;Sitting Grooming Details (indicate cue type and reason): EOB with increased time. noted to have lateral leaning to L side that increased with fatigue. Upper Body Bathing: Moderate assistance;Bed level   Lower Body  Bathing: Total assistance;Bed level   Upper Body Dressing : Moderate assistance;Bed level   Lower Body Dressing: Maximal assistance;Bed level      Toilet Transfer Details (indicate cue type and reason): Deferred, patient having too much pain in R leg to attempt sitting EOB Toileting- Clothing Manipulation and Hygiene: Total assistance;Bed level         General ADL Comments: patient was noted to have wet pad underneath her at start of session with TD for hygeine and changing linens. patient was able to assist with rolling with min A and use of bed rails with cues for placement of hands.     Vision Patient Visual Report: No change from baseline       Perception     Praxis      Pertinent Vitals/Pain Pain Assessment Pain Assessment: Faces Faces Pain Scale: Hurts a little bit Pain Location: R arm and R LE residual limb phantom pain Pain Descriptors / Indicators: Sore     Hand Dominance Right   Extremity/Trunk Assessment Upper Extremity Assessment Upper Extremity Assessment: RUE deficits/detail;LUE deficits/detail RUE Deficits / Details: Right forearm still dressed. Reports some pain but able to use effectively LUE Deficits / Details: Grossly intact, noted to have BUE edema with BUE elevated at end of session with pillows. arm bands were tight on wrist with nurse asked to remove them during session. nurse was able to complete.   Lower Extremity Assessment Lower Extremity Assessment: Defer to PT evaluation RLE Deficits / Details: dressings in place R LE RLE: Unable to fully assess due to pain LLE Deficits / Details: Pt states to not touch residual limb   Cervical / Trunk Assessment Cervical / Trunk Assessment: Other exceptions;Normal Cervical / Trunk Exceptions: habitus   Communication Communication Communication: No difficulties   Cognition Arousal/Alertness: Awake/alert Behavior During Therapy: WFL for tasks assessed/performed Overall Cognitive Status: No family/caregiver present to determine baseline cognitive functioning Area of Impairment: Orientation, Memory, Problem solving                 Orientation  Level: Disoriented to, Time, Situation Current Attention Level: Sustained Memory: Decreased short-term memory Following Commands: Follows one step commands consistently Safety/Judgement: Decreased awareness of safety, Decreased awareness of deficits   Problem Solving: Requires verbal cues, Requires tactile cues, Difficulty sequencing, Decreased initiation, Slow processing General Comments: Cues for focus on task, pt very cooperative but with noted tangential train of thought. noted to have moments of tearfulness during session.     General Comments       Exercises     Shoulder Instructions      Home Living Family/patient expects to be discharged to:: Skilled nursing facility Living Arrangements: Other relatives Available Help at Discharge: Family;Available PRN/intermittently Type of Home: House Home Access: Stairs to enter [employer] of Steps: back door none, front door 8   Home Layout: Two level;Bed/bath upstairs Alternate Level Stairs-Number of Steps: flight with landing   Bathroom Shower/Tub: Teacher, early years/pre: Standard     Home Equipment: Conservation officer, nature (2 wheels)          Prior Functioning/Environment Prior Level of Function : Independent/Modified Independent             Mobility Comments: prior to recent hospital admit          OT Problem List: Pain;Obesity;Decreased activity tolerance;Decreased strength;Decreased range of motion;Impaired balance (sitting and/or standing);Decreased cognition;Decreased safety awareness;Cardiopulmonary status limiting activity      OT Treatment/Interventions: Self-care/ADL training;Therapeutic exercise;DME and/or  AE instruction;Therapeutic activities;Cognitive remediation/compensation;Balance training;Patient/family education    OT Goals(Current goals can be found in the care plan section) Acute Rehab OT Goals Patient Stated Goal: none stated OT Goal Formulation: Patient unable to participate  in goal setting Time For Goal Achievement: 02/01/22 Potential to Achieve Goals: Good  OT Frequency: Min 2X/week    Co-evaluation   Reason for Co-Treatment: For patient/therapist safety;To address functional/ADL transfers PT goals addressed during session: Mobility/safety with mobility OT goals addressed during session: ADL's and self-care      AM-PAC OT "6 Clicks" Daily Activity     Outcome Measure Help from another person eating meals?: A Little Help from another person taking care of personal grooming?: A Little Help from another person toileting, which includes using toliet, bedpan, or urinal?: Total Help from another person bathing (including washing, rinsing, drying)?: A Lot Help from another person to put on and taking off regular upper body clothing?: A Lot Help from another person to put on and taking off regular lower body clothing?: A Lot 6 Click Score: 13   End of Session Nurse Communication: Other (comment) (nurse called in to remove tight arm band)  Activity Tolerance: Patient tolerated treatment well Patient left: in bed;with call bell/phone within reach;with bed alarm set  OT Visit Diagnosis: Other abnormalities of gait and mobility (R26.89);Pain                Time: 5366-4403 OT Time Calculation (min): 35 min Charges:  OT General Charges $OT Visit: 1 Visit OT Evaluation $OT Eval Moderate Complexity: 1 Mod  Jackelyn Poling OTR/L, MS Acute Rehabilitation Department Office# [phone] Pager# [phone]   [name] Millin 01/18/2022, 4:14 PM

## 2022-01-18 NOTE — Progress Notes (Signed)
Patient laying on right side, reports feeling severe pain in "poop sack" when laying on back. Patient indicates she feels a hard stool at the rectum and is unable to push it out. Patient's HR rising to 160s when patient bears down to push stool.  Assisted patient  to full right side, noted patient does have partially extruding stool which is hard, able to digitally disimpact large amount of stool, patient uncomfortable throughout. Stool still present in rectum but plans for therapy to assist patient, will attempt to assist to [facility] for continued BM effort. Assisted w/ hygiene, purewick in place and positioned for comfort in bed. Able to tolerate laying on back at this time.

## 2022-01-19 DIAGNOSIS — L03113 Cellulitis of right upper limb: Secondary | ICD-10-CM | POA: Diagnosis not present

## 2022-01-19 LAB — CBC WITH DIFFERENTIAL/PLATELET
Abs Immature Granulocytes: 0.04 10*3/uL (ref 0.00–0.07)
Basophils Absolute: 0 10*3/uL (ref 0.0–0.1)
Basophils Relative: 0 %
Eosinophils Absolute: 0.1 10*3/uL (ref 0.0–0.5)
Eosinophils Relative: 2 %
HCT: 29.6 % — ABNORMAL LOW (ref 36.0–46.0)
Hemoglobin: 9.1 g/dL — ABNORMAL LOW (ref 12.0–15.0)
Immature Granulocytes: 1 %
Lymphocytes Relative: 9 %
Lymphs Abs: 0.5 10*3/uL — ABNORMAL LOW (ref 0.7–4.0)
MCH: 31.6 pg (ref 26.0–34.0)
MCHC: 30.7 g/dL (ref 30.0–36.0)
MCV: 102.8 fL — ABNORMAL HIGH (ref 80.0–100.0)
Monocytes Absolute: 0.4 10*3/uL (ref 0.1–1.0)
Monocytes Relative: 7 %
Neutro Abs: 4.9 10*3/uL (ref 1.7–7.7)
Neutrophils Relative %: 81 %
Platelets: 76 10*3/uL — ABNORMAL LOW (ref 150–400)
RBC: 2.88 MIL/uL — ABNORMAL LOW (ref 3.87–5.11)
RDW: 16.4 % — ABNORMAL HIGH (ref 11.5–15.5)
WBC: 6.1 10*3/uL (ref 4.0–10.5)
nRBC: 0 % (ref 0.0–0.2)

## 2022-01-19 LAB — GLUCOSE, CAPILLARY
Glucose-Capillary: 82 mg/dL (ref 70–99)
Glucose-Capillary: 97 mg/dL (ref 70–99)
Glucose-Capillary: 110 mg/dL — ABNORMAL HIGH (ref 70–99)
Glucose-Capillary: 127 mg/dL — ABNORMAL HIGH (ref 70–99)

## 2022-01-19 LAB — BASIC METABOLIC PANEL
Anion gap: 3 — ABNORMAL LOW (ref 5–15)
BUN: 28 mg/dL — ABNORMAL HIGH (ref 8–23)
CO2: 29 mmol/L (ref 22–32)
Calcium: 9.3 mg/dL (ref 8.9–10.3)
Chloride: 111 mmol/L (ref 98–111)
Creatinine, Ser: 0.84 mg/dL (ref 0.44–1.00)
GFR, Estimated: >60 mL/min (ref >60)
Glucose, Bld: 83 mg/dL (ref 70–99)
Potassium: 3.9 mmol/L (ref 3.5–5.1)
Sodium: 143 mmol/L (ref 135–145)

## 2022-01-19 NOTE — Plan of Care (Signed)
  Problem: Health Behavior/Discharge Planning: Goal: Ability to manage health-related needs will improve Outcome: Progressing   Problem: Clinical Measurements: Goal: Ability to maintain clinical measurements within normal limits will improve Outcome: Progressing Goal: Will remain free from infection Outcome: Progressing Goal: Diagnostic test results will improve Outcome: Progressing Goal: Respiratory complications will improve Outcome: Progressing Goal: Cardiovascular complication will be avoided Outcome: Progressing   Problem: Activity: Goal: Risk for activity intolerance will decrease Outcome: Progressing   Problem: Nutrition: Goal: Adequate nutrition will be maintained Outcome: Progressing   Problem: Coping: Goal: Level of anxiety will decrease Outcome: Progressing   Problem: Pain Managment: Goal: General experience of comfort will improve Outcome: Progressing   Problem: Safety: Goal: Ability to remain free from injury will improve Outcome: Progressing   Problem: Skin Integrity: Goal: Risk for impaired skin integrity will decrease Outcome: Progressing   Problem: Clinical Measurements: Goal: Ability to avoid or minimize complications of infection will improve Outcome: Progressing   Problem: Skin Integrity: Goal: Skin integrity will improve Outcome: Progressing

## 2022-01-19 NOTE — Progress Notes (Signed)
Physical Therapy Treatment Patient Details Name: Shannon [name] MRN: [id number] DOB: 21-Jun-1956 Today's Date: 01/19/2022   History of Present Illness patient is a 66 year old female who was recently at this hospital from 4/26 to 5/24 with d/c to SNF for continuation of skilled services. patient presented with incresed edema and discharge from RUE. patient was admitted with cellulitis of right arm.  PMH: CVA, fungemia, hypoxic respiratory failure, AKA on 5/5, right forearm infection with fasciotomy and biopsy, HIV, CKD, DM II, CHF, atrial flutter, GERD, morbid obesity,    PT Comments    Pt slumped over in bed after eating with O2NC off, but generally alert, originally requesting to defer therapy but after offering to adjust pt in bed found to be incontinent of bowel. Pt min assist for rolling R&L for pericare and for scooting up in bed. Pt did complete several LE exercises with increased time for rest breaks and redirection from pt's conversations. Further mobility deferred as pt requesting to finish her meal. Discharge destination remains appropriate. We will continue to follow her acutely.    Recommendations for follow up therapy are one component of a multi-disciplinary discharge planning process, led by the attending physician.  Recommendations may be updated based on patient status, additional functional criteria and insurance authorization.  Follow Up Recommendations  Skilled nursing-short term rehab (<3 hours/day)     Assistance Recommended at Discharge Frequent or constant Supervision/Assistance  Patient can return home with the following Two people to help with walking and/or transfers;Assistance with cooking/housework;Assist for transportation;Help with stairs or ramp for entrance;Two people to help with bathing/dressing/bathroom;Direct supervision/assist for medications management   Equipment Recommendations  None recommended by PT    Recommendations for Other Services Rehab  consult     Precautions / Restrictions Precautions Precautions: Fall Precaution Comments: R AKA, moves quick - be close Required Braces or Orthoses: Other Brace Splint/Cast: none Restrictions Weight Bearing Restrictions: Yes RLE Weight Bearing: Non weight bearing Other Position/Activity Restrictions: Recent R AKA     Mobility  Bed Mobility Overal bed mobility: Needs Assistance Bed Mobility: Rolling Rolling: Min assist         General bed mobility comments: Pt generally slouched over in bed upon arrival not wearing O2 [place], but alert and responsive to PT. Pt rolled R&L to perform pericare as pt had been incontinent of bowel in bed, required min assist for pelvis and multimodal cuing for hand placement.    Transfers   Equipment used:  (stedy)               General transfer comment: Deferred, pt requested to finish her lunch.    Ambulation/Gait               General Gait Details: currently non amb NEW AKA   Stairs             Wheelchair Mobility    Modified Rankin (Stroke Patients Only)       Balance                                            Cognition Arousal/Alertness: Awake/alert Behavior During Therapy: WFL for tasks assessed/performed Overall Cognitive Status: No family/caregiver present to determine baseline cognitive functioning Area of Impairment: Orientation, Memory, Problem solving                 Orientation Level: Disoriented to,  Time, Situation Current Attention Level: Sustained Memory: Decreased short-term memory Following Commands: Follows one step commands consistently Safety/Judgement: Decreased awareness of safety, Decreased awareness of deficits Awareness: Emergent Problem Solving: Requires verbal cues, Requires tactile cues, Difficulty sequencing, Decreased initiation, Slow processing General Comments: Cues for focus on task, pt very cooperative but with noted tangential train of thought.         Exercises Other Exercises Other Exercises: Ankle pumps LLE x10 Other Exercises: Hip ABD/adduction b/l x10 Other Exercises: heel slides x10 LLE    General Comments        Pertinent Vitals/Pain Pain Assessment Pain Assessment: 0-10 Pain Score: 8  Pain Location: R LE residual limb phantom pain Pain Descriptors / Indicators: Sore Pain Intervention(s): Limited activity within patient's tolerance, Monitored during session, Repositioned    Home Living                          Prior Function            PT Goals (current goals can now be found in the care plan section) Acute Rehab PT Goals Patient Stated Goal: get better PT Goal Formulation: With patient Time For Goal Achievement: 02/01/22 Potential to Achieve Goals: Fair Progress towards PT goals: Progressing toward goals    Frequency    Min 2X/week      PT Plan Current plan remains appropriate    Co-evaluation              AM-PAC PT "6 Clicks" Mobility   Outcome Measure  Help needed turning from your back to your side while in a flat bed without using bedrails?: A Little Help needed moving from lying on your back to sitting on the side of a flat bed without using bedrails?: A Lot Help needed moving to and from a bed to a chair (including a wheelchair)?: A Lot Help needed standing up from a chair using your arms (e.g., wheelchair or bedside chair)?: Total Help needed to walk in hospital room?: Total Help needed climbing 3-5 steps with a railing? : Total 6 Click Score: 10    End of Session Equipment Utilized During Treatment: Gait belt;Oxygen Activity Tolerance: Patient limited by fatigue Patient left: in bed;with call bell/phone within reach;with bed alarm set Nurse Communication: Mobility status PT Visit Diagnosis: Unsteadiness on feet (R26.81);Other abnormalities of gait and mobility (R26.89);Muscle weakness (generalized) (M62.81);Other symptoms and signs involving the nervous system  (R29.898);Pain Pain - Right/Left: Right Pain - part of body: Leg (and arm)     Time: 1027-2536 PT Time Calculation (min) (ACUTE ONLY): 25 min  Charges:  $Therapeutic Activity: 23-37 mins                     Coolidge Breeze, PT, DPT [facility] Department Office: [phone] Pager: [phone]   Coolidge Breeze 01/19/2022, 2:08 PM

## 2022-01-19 NOTE — Progress Notes (Addendum)
PROGRESS NOTE  Shannon [name] TMA:[id number] DOB: 03-13-1956 DOA: 01/15/2022 PCP: [name], MD  HPI/Recap of past 24 hours: Shannon [name] is a 66 y.o. female with medical history significant of metastatic adenocarcinoma of lung, HIV, COPD, CVA, recent right AKA and right upper extremity abscess requiring I&D was sent to [facility] on 01/14/22 for rehab comes back to the ER due to worsening swelling of the right upper extremity and some drainage. Patient had prolonged hospitalization here from 4/26 - 01/14/2022 for necrotizing fasciitis of the right foot requiring AKA on 5/5 also complicated by right forearm infection requiring fasciotomy and biopsy by hand surgery.  At that time.  Course was complicated by subacute CVA, fungemia, hypoxic respiratory failure.  Patient was also on Xarelto at the time of discharge.  She also had a wound VAC which was removed prior to her discharge.  She did complete her antibiotic course in the hospital.   After returning to her facility they noted worsening of right upper extremity swelling with some serosanguineous discharge and swelling therefore sent to Er for evaluation.   In the ER patient was noted to have right upper extremity swelling, slight tachycardia.  WBC was overall unremarkable.  Orthopedic/hand surgeon; Dr Greta Doom, was consulted by ED provider and saw the patient in the ED.  Ortho did not feel there was an abscess of surgical indication but agreed with aggressive abx for cellulitis.  She was started on IV vancomycin, cefepime and Flagyl.   01/19/22: The patient was seen and examined at bedside.  She reports diffuse pain 9 out of 10.  Improved with pain medications.   Assessment/Plan: Principal Problem:   Cellulitis of right arm Active Problems:   HIV disease ([place])   Uncontrolled hypertension   GERD (gastroesophageal reflux disease)   Chronic kidney disease (CKD), stage III (moderate) (HCC)   CAD (coronary artery disease)   Atrial  flutter (HCC)   (HFpEF) heart failure with preserved ejection fraction (HCC)   Malignant neoplasm metastatic to bone (HCC)   Cellulitis  Right upper extremity cellulitis with concerns of serosanguineous/purulent discharge - Cultures NTD -- Started on broad-spectrum antibiotics-IV vancomycin, cefepime and Flagyl. --  Orthopedic consulted and saw pt in ER-not a surgical case --  Xarelto initially held for possible surgery, but has been restarted - As noted, Recently underwent fascia biopsy and fasciotomy which showed fibroadipose tissue. Analgesics as needed   Right foot abscess with necrotizing fasciitis status post AKA - AKA performed 5/5.   -- Completed antibiotic course for this.   -- Wound VAC was removed and was supposed to follow-up with Dr. Sharol Given in 1 week..  -- Healing well, no dehis noted  Chronic macrocytic anemia Hemoglobin downtrending 9.1 with MCV 102 No overt bleeding Continue to monitor H&H   Recent CVA of right anterior temporal lobe - Was on Xarelto outpatient.  Neurology recommended repeat MRI brain on 02/01/2022.   HIV disease - CD4 count 68.  On Biktarvy.   Diabetes mellitus type 2, insulin-dependent - A1c 7.3 on 12/17/2021.  On Decadron.  Continue Levemir, sliding scale and Accu-Cheks.   CKD stage IIb - Creatinine around baseline, at 0.91   Essential hypertension -Home antihypertensives were on hold except Coreg due to soft blood pressure. --will titrate back  in slowly, hydralazine today   Congestive heart failure with preserved ejection fraction - Continue Coreg.   --received gentle hydration due to signs of clinical dehydration, now off --Monitor her volume status.  Aldactone  remains on hold   History of atrial flutter - restarted xarelto -- Continue Coreg.   GERD - PPI   Morbid obesity with BMI greater than 35 - Recently seen by dietitian   COPD -as needed bronchodilators.  Probably also has OSA, will order CPAP at bedtime   Recent  fungemia - Completed Diflucan on 5/15.  Seen by ID and ophthalmology.     DVT prophylaxis: xarelto Code Status: Full code      Family Communication: None at bedside.  Disposition Plan: SNF recommended by PT OT     Status is: Inpatient The patient requires at least 2 midnights for further evaluation and treatment of present condition.    Objective: Vitals:   01/18/22 0442 01/18/22 1517 01/18/22 2003 01/19/22 0544  BP: (!) 154/92 132/86 138/76 (!) 149/80  Pulse: 84 76 85 85  Resp: 16 16 18 18   Temp: 98.9 F (37.2 C) 98.3 F (36.8 C) 98.4 F (36.9 C) 98.2 F (36.8 C)  TempSrc: Oral Oral Oral Oral  SpO2: 99% 96% 93% 94%  Weight:      Height:        Intake/Output Summary (Last 24 hours) at 01/19/2022 0944 Last data filed at 01/19/2022 0600 Gross per 24 hour  Intake 1060.06 ml  Output 700 ml  Net 360.06 ml   Filed Weights   01/17/22 1915  Weight: 91.1 kg    Exam:  General: 66 y.o. year-old female well developed well nourished in no acute distress.  Alert and oriented x3. Cardiovascular: Regular rate and rhythm with no rubs or gallops.  No thyromegaly or JVD noted.   Respiratory: Clear to auscultation with no wheezes or rales. Good inspiratory effort. Abdomen: Soft nontender nondistended with normal bowel sounds. Musculoskeletal: AKA. Psychiatry: Mood is appropriate for condition and setting   Data Reviewed: CBC: Recent Labs  Lab 01/13/22 0500 01/15/22 1531 01/16/22 0934 01/17/22 0539 01/19/22 0444  WBC 9.3 9.2 7.5 7.9 6.1  NEUTROABS  --  8.3*  --  6.6 4.9  HGB 9.4* 10.1* 9.4* 9.7* 9.1*  HCT 30.1* 32.9* 30.2* 30.9* 29.6*  MCV 101.3* 103.1* 104.1* 101.0* 102.8*  PLT 125* 106* 89* 79* 76*   Basic Metabolic Panel: Recent Labs  Lab 01/14/22 1211 01/15/22 1531 01/16/22 0934 01/17/22 0539 01/19/22 0444  NA 144 145 143 141 143  K 4.6 4.9 4.3 3.7 3.9  CL 102 103 106 105 111  CO2 35* 34* 34* 29 29  GLUCOSE 171* 258* 94 66* 83  BUN 52* 50* 41*  35* 28*  CREATININE 1.10* 1.15* 0.87 0.91 0.84  CALCIUM 10.2 10.2 9.3 9.4 9.3  MG 1.9  --  2.0 1.8  --    GFR: Estimated Creatinine Clearance: 71.1 mL/min (by C-G formula based on SCr of 0.84 mg/dL). Liver Function Tests: Recent Labs  Lab 01/12/22 1135 01/13/22 0500 01/15/22 1531  AST 19 17 26   ALT 31 38 44  ALKPHOS 80 79 84  BILITOT 0.7 0.9 0.5  PROT 5.3* 5.2* 5.7*  ALBUMIN 2.7* 2.7* 2.9*   No results for input(s): LIPASE, AMYLASE in the last 168 hours. No results for input(s): AMMONIA in the last 168 hours. Coagulation Profile: Recent Labs  Lab 01/15/22 1531  INR 1.1   Cardiac Enzymes: No results for input(s): CKTOTAL, CKMB, CKMBINDEX, TROPONINI in the last 168 hours. BNP (last 3 results) No results for input(s): PROBNP in the last 8760 hours. HbA1C: No results for input(s): HGBA1C in the last 72 hours. CBG: Recent  Labs  Lab 01/18/22 0713 01/18/22 1129 01/18/22 1625 01/18/22 2002 01/19/22 0741  GLUCAP 77 131* 77 214* 82   Lipid Profile: No results for input(s): CHOL, HDL, LDLCALC, TRIG, CHOLHDL, LDLDIRECT in the last 72 hours. Thyroid Function Tests: No results for input(s): TSH, T4TOTAL, FREET4, T3FREE, THYROIDAB in the last 72 hours. Anemia Panel: No results for input(s): VITAMINB12, FOLATE, FERRITIN, TIBC, IRON, RETICCTPCT in the last 72 hours. Urine analysis:    Component Value Date/Time   COLORURINE YELLOW 01/15/2022 [place] 01/15/2022 1633   APPEARANCEUR Turbid (A) 08/19/2021 1540   LABSPEC 1.019 01/15/2022 1633   PHURINE 5.0 01/15/2022 1633   GLUCOSEU 50 (A) 01/15/2022 1633   HGBUR NEGATIVE 01/15/2022 1633   BILIRUBINUR NEGATIVE 01/15/2022 1633   BILIRUBINUR Negative 08/19/2021 1540   KETONESUR NEGATIVE 01/15/2022 1633   PROTEINUR NEGATIVE 01/15/2022 1633   UROBILINOGEN 0.2 02/27/2021 1533   NITRITE NEGATIVE 01/15/2022 1633   LEUKOCYTESUR NEGATIVE 01/15/2022 1633   Sepsis  Labs: @LABRCNTIP (procalcitonin:4,lacticidven:4)  ) Recent Results (from the past 240 hour(s))  Blood Culture (routine x 2)     Status: None (Preliminary result)   Collection Time: 01/15/22  3:20 PM   Specimen: BLOOD  Result Value Ref Range Status   Specimen Description   Final    BLOOD BLOOD LEFT FOREARM Performed at [facility], [place] [address], [place], [place] [address]    Special Requests   Final    BOTTLES DRAWN AEROBIC AND ANAEROBIC Blood Culture results may not be optimal due to an excessive volume of blood received in culture bottles Performed at [place] [address], [place],  [address]    Culture   Final    NO GROWTH 4 DAYS Performed at [facility], [place] [address] [place],  [address]    Report Status PENDING  Incomplete  Resp Panel by RT-PCR (Flu A&B, Covid) Anterior Nasal Swab     Status: None   Collection Time: 01/15/22  3:31 PM   Specimen: Anterior Nasal Swab  Result Value Ref Range Status   SARS Coronavirus 2 by RT PCR NEGATIVE NEGATIVE Final    Comment: (NOTE) SARS-CoV-2 target nucleic acids are NOT DETECTED.  The SARS-CoV-2 RNA is generally detectable in upper respiratory specimens during the acute phase of infection. The lowest concentration of SARS-CoV-2 viral copies this assay can detect is 138 copies/mL. A negative result does not preclude SARS-Cov-2 infection and should not be used as the sole basis for treatment or other patient management decisions. A negative result may occur with  improper specimen collection/handling, submission of specimen other than nasopharyngeal swab, presence of viral mutation(s) within the areas targeted by this assay, and inadequate number of viral copies(<138 copies/mL). A negative result must be combined with clinical observations, patient history, and epidemiological information. The expected result is Negative.  Fact Sheet for Patients:   EntrepreneurPulse.com.au  Fact Sheet for Healthcare Providers:  IncredibleEmployment.be  This test is no t yet approved or cleared by the [place] FDA and  has been authorized for detection and/or diagnosis of SARS-CoV-2 by FDA under an Emergency Use Authorization (EUA). This EUA will remain  in effect (meaning this test can be used) for the duration of the COVID-19 declaration under Section 564(b)(1) of the Act, 21 U.S.C.section 360bbb-3(b)(1), unless the authorization is terminated  or revoked sooner.       Influenza A by PCR NEGATIVE NEGATIVE Final   Influenza B by PCR NEGATIVE  NEGATIVE Final    Comment: (NOTE) The Xpert Xpress SARS-CoV-2/FLU/RSV plus assay is intended as an aid in the diagnosis of influenza from Nasopharyngeal swab specimens and should not be used as a sole basis for treatment. Nasal washings and aspirates are unacceptable for Xpert Xpress SARS-CoV-2/FLU/RSV testing.  Fact Sheet for Patients: EntrepreneurPulse.com.au  Fact Sheet for Healthcare Providers: IncredibleEmployment.be  This test is not yet approved or cleared by the [place] FDA and has been authorized for detection and/or diagnosis of SARS-CoV-2 by FDA under an Emergency Use Authorization (EUA). This EUA will remain in effect (meaning this test can be used) for the duration of the COVID-19 declaration under Section 564(b)(1) of the Act, 21 U.S.C. section 360bbb-3(b)(1), unless the authorization is terminated or revoked.  Performed at [facility], [place] [address], [place], [place] [address]   Blood Culture (routine x 2)     Status: None (Preliminary result)   Collection Time: 01/16/22  6:01 AM   Specimen: BLOOD LEFT FOREARM  Result Value Ref Range Status   Specimen Description   Final    BLOOD LEFT FOREARM Performed at [facility] [address], [place], [place]  [address]    Special Requests   Final    BOTTLES DRAWN AEROBIC ONLY Blood Culture adequate volume Performed at [place] [address] [place], [place] [address]    Culture   Final    NO GROWTH 3 DAYS Performed at [facility], [place] [address], [place],  [address]    Report Status PENDING  Incomplete      Studies: No results found.  Scheduled Meds:  bictegravir-emtricitabine-tenofovir AF  1 tablet Oral Daily   carvedilol  12.5 mg Oral BID WC   chlorhexidine  15 mL Mouth Rinse BID   docusate sodium  100 mg Oral Daily   hydrALAZINE  25 mg Oral Q8H   insulin aspart  0-5 Units Subcutaneous QHS   insulin aspart  0-9 Units Subcutaneous TID WC   insulin detemir  8 Units Subcutaneous QHS   mouth rinse  15 mL Mouth Rinse q12n4p   pantoprazole  40 mg Oral Daily   rivaroxaban  20 mg Oral Q supper    Continuous Infusions:  ceFEPime (MAXIPIME) IV 2 g (01/19/22 0908)   metronidazole 500 mg (01/19/22 0401)   vancomycin 1,000 mg (01/18/22 1736)     LOS: 3 days     Kayleen Memos, MD [facility] Pager [phone]  If 7PM-7AM, please contact night-coverage [email or web address] Password [email or web address] 01/19/2022, 9:44 AM

## 2022-01-19 NOTE — Progress Notes (Signed)
Patient calling out to staff, stating that she is having a BM but also that she can't move her bowels. Noted at this time patient incont of stool. RN asked if patient would be willing to try to sit to [facility], patient stated "that would be heaven." RN assisted patient to sitting position at side of bed, though patient was giving contracting statements that she wanted to get up but that she also needed to wait. With encouragement and min assist patient able to sit to side of bed. RN placed gait belt and positioned BSC next to bed. Patient was able to push up on left leg to a half-standing position but was unable to rise fully. Assisted back to bed. Patient still wanted to get to [facility]. Additional staff called. With x2 staff, attempted to lift patient but patient was unable to provide any lift assistance. Positioned back in bed, assisted w/ hygiene, purewick in place. Patient tolerated well. Will cont to monitor.

## 2022-01-20 ENCOUNTER — Ambulatory Visit: Payer: Medicare Other | Admitting: Internal Medicine

## 2022-01-20 DIAGNOSIS — Z8739 Personal history of other diseases of the musculoskeletal system and connective tissue: Secondary | ICD-10-CM

## 2022-01-20 DIAGNOSIS — L03113 Cellulitis of right upper limb: Secondary | ICD-10-CM | POA: Diagnosis not present

## 2022-01-20 DIAGNOSIS — T8149XA Infection following a procedure, other surgical site, initial encounter: Secondary | ICD-10-CM

## 2022-01-20 DIAGNOSIS — B20 Human immunodeficiency virus [HIV] disease: Secondary | ICD-10-CM | POA: Diagnosis not present

## 2022-01-20 LAB — BASIC METABOLIC PANEL
Anion gap: 4 — ABNORMAL LOW (ref 5–15)
BUN: 23 mg/dL (ref 8–23)
CO2: 28 mmol/L (ref 22–32)
Calcium: 9.1 mg/dL (ref 8.9–10.3)
Chloride: 109 mmol/L (ref 98–111)
Creatinine, Ser: 0.87 mg/dL (ref 0.44–1.00)
GFR, Estimated: >60 mL/min (ref >60)
Glucose, Bld: 89 mg/dL (ref 70–99)
Potassium: 3.7 mmol/L (ref 3.5–5.1)
Sodium: 141 mmol/L (ref 135–145)

## 2022-01-20 LAB — GLUCOSE, CAPILLARY
Glucose-Capillary: 112 mg/dL — ABNORMAL HIGH (ref 70–99)
Glucose-Capillary: 91 mg/dL (ref 70–99)
Glucose-Capillary: 100 mg/dL — ABNORMAL HIGH (ref 70–99)
Glucose-Capillary: 103 mg/dL — ABNORMAL HIGH (ref 70–99)

## 2022-01-20 LAB — CULTURE, BLOOD (ROUTINE X 2): Culture: NO GROWTH

## 2022-01-20 LAB — MAGNESIUM: Magnesium: 2.2 mg/dL (ref 1.7–2.4)

## 2022-01-20 LAB — C-REACTIVE PROTEIN: CRP: 5.8 mg/dL — ABNORMAL HIGH (ref <1.0)

## 2022-01-20 MED ORDER — DOXYCYCLINE HYCLATE 100 MG PO TABS
100.0000 mg | ORAL_TABLET | Freq: Two times a day (BID) | ORAL | Status: DC
  Administered 2022-01-20 – 2022-01-22 (×5): 100 mg via ORAL
  Filled 2022-01-20 (×6): qty 1

## 2022-01-20 MED ORDER — DOXYCYCLINE HYCLATE 100 MG PO TABS: 100.0000 mg | ORAL_TABLET | Freq: Two times a day (BID) | ORAL | 0 refills | Status: DC

## 2022-01-20 MED ORDER — AMOXICILLIN-POT CLAVULANATE 875-125 MG PO TABS
1.0000 | ORAL_TABLET | Freq: Two times a day (BID) | ORAL | Status: DC
  Administered 2022-01-20 – 2022-01-22 (×5): 1 via ORAL
  Filled 2022-01-20 (×6): qty 1

## 2022-01-20 MED ORDER — CEPHALEXIN 500 MG PO CAPS: 500.0000 mg | ORAL_CAPSULE | Freq: Four times a day (QID) | ORAL | Status: DC

## 2022-01-20 NOTE — Progress Notes (Signed)
PT Cancellation Note  Patient Details Name: ZADIE DEEMER MRN: [id number] DOB: [birth year]   Cancelled Treatment:    Reason Eval/Treat Not Completed: Patient declined, no reason specified (Pt reporting she wants to try this afternoon, citing her RUE wound is seeping blood and bandange is loose, RN notified. Dr. Sharol Given entered to examine RLE so deferred per pt request. Will follow up as schedule allows.)  Coolidge Breeze, PT, DPT [facility] Department Office: [phone] Pager: [phone]  Coolidge Breeze 01/20/2022, 11:11 AM

## 2022-01-20 NOTE — Progress Notes (Signed)
PROGRESS NOTE   [name]  KZS:[id number]    DOB: 1956/08/02    DOA: 01/15/2022  PCP: [name], MD   I have briefly reviewed patients previous medical records in [facility].  Chief Complaint  Patient presents with   Wound Check    Brief Narrative:  66 year old female with medical history significant for recurrent and metastatic/stage IV adenocarcinoma of the lung, controlled HIV disease, COPD, chronic diastolic CHF, CVA, recent prolonged hospitalization from 12/17/2021 - 01/14/2022 for necrotizing fasciitis of right foot eventually requiring AKA on 5/5 by Dr. Sharol Given, right forearm infection requiring fasciotomy and biopsy by hand surgery, course complicated by subacute CVA, fungemia, acute hypoxic respiratory failure, she completed course of antibiotics in the hospital, wound VAC was removed prior to discharge, discharged to SNF for less than 24 hours and readmitted for worsening of right upper extremity swelling with some serosanguineous discharge and swelling.  Evaluated in ED by orthopedics/hand surgery, did not see any role for surgical intervention, empirically started on broad-spectrum IV antibiotics for suspected RUE cellulitis.  ID and orthopedics/Dr. Sharol Given consulted on 5/30.  Medically optimized for DC to SNF as of 5/30, TOC was consulted early this morning but insurance authorization being initiated now.   Assessment & Plan:  Principal Problem:   Cellulitis of right arm Active Problems:   HIV disease ([place])   Uncontrolled hypertension   GERD (gastroesophageal reflux disease)   Chronic kidney disease (CKD), stage III (moderate) (HCC)   CAD (coronary artery disease)   Atrial flutter (HCC)   (HFpEF) heart failure with preserved ejection fraction (HCC)   Malignant neoplasm metastatic to bone (HCC)   Cellulitis   Right upper extremity cellulitis with concerns of serosanguineous/purulent discharge on admission - Blood cultures x2 from 5/25 and 5/26: Negative to  date. -- Started on broad-spectrum antibiotics-IV vancomycin, cefepime and Flagyl on 5/25. --  Orthopedic/hand surgery, Dr. Greta Doom consulted and saw pt in ER-he did not think there were any signs of infection based on his exam, no signs of deep abscess or compartment syndrome or development of new abscess within the right upper extremity.  He recommended daily dressing changes. --  Xarelto initially held for possible surgery, but was restarted - As noted, Recently underwent fascia biopsy and fasciotomy which showed fibroadipose tissue. -As examined today, no clear evidence of skin or soft tissue infection.  Consulted ID who recommend Augmentin and doxycycline to cover for possible RUE cellulitis on admission and right AKA site dehiscence with drainage.   Right foot abscess with necrotizing fasciitis, status post AKA 12/26/2021 -- Completed antibiotic course for this.   -- Wound VAC was removed and was supposed to follow-up with Dr. Sharol Given in 1 week but hospitalized. Requested Dr. Sharol Given to follow up 5/30 -Dr. Sharol Given follow-up appreciated, noted wound with slight dehiscence with some redness around the edges and clear serosanguineous drainage, recommends continuing daily dry dressing changes and oral doxycycline.  Awaiting ID input as well regarding antibiotics.  Thrombocytopenia Unclear etiology.  Had it prior to 5/9 and then had resolved and platelet counts had rebounded to normal.  Again down since 5/22.  Stable in the 70s 5/27 - 5/29.  No overt bleeding.  Follow CBC in a.m.  Consider further evaluation if has worsening.?  Related to antibiotics.   Chronic macrocytic anemia Hemoglobin essentially stable in the 9-10 g range for last several days. Periodically follow CBCs.   Recent CVA of right anterior temporal lobe - Was on Xarelto outpatient.  Neurology recommended repeat MRI brain on 02/01/2022.   HIV disease - CD4 count 68.  On [place] with [name], [place].  Has upcoming follow-up on  7/10 at 11 AM.   Diabetes mellitus type 2, insulin-dependent - A1c 7.3 on 12/17/2021.  On Decadron.  Continue Levemir, sliding scale and Accu-Cheks. Good inpatient control.   CKD stage IIb - Stable.   Essential hypertension -Home antihypertensives were on hold except Coreg due to soft blood pressure. -- Mostly reasonably controlled, continue carvedilol and hydralazine.   Congestive heart failure with preserved ejection fraction - Clinically euvolemic.  Continue carvedilol. Aldactone on hold and can be resumed at time of discharge.   History of atrial flutter - restarted xarelto -- Continue Coreg.   GERD - PPI   Morbid obesity with BMI greater than 35 - Recently seen by dietitian   COPD -as needed bronchodilators.  Probably also has OSA, will order CPAP at bedtime but will need sleep study to be performed as outpatient.   Recent fungemia - Completed Diflucan on 5/15.  Seen by ID and ophthalmology.    Body mass index is 34.48 kg/m.   DVT prophylaxis:   On Xarelto   Code Status: Full Code:  Family Communication: Discussed in detail with patient's sister in [place] via phone, updated her with care and answered questions.  Advised her that patient is stabilized for DC to SNF pending ID and orthopedic clearance. Disposition:  Status is: Inpatient Medically optimized for DC.  Awaiting SNF insurance authorization per discussion with TOC.  Not likely to DC until 5/31.     Consultants:   ID Orthopedics  Procedures:   None  Antimicrobials:   As above   Subjective:  Seen this morning.  Overall poor historian.  Reports some appropriate postop right AKA pain but no right upper extremity pain.  Objective:   Vitals:   01/19/22 2045 01/20/22 0244 01/20/22 0400 01/20/22 1354  BP: (!) 149/90  138/84 (!) 169/92  Pulse: 97  91 90  Resp: 20 (!) 23 19 20   Temp: 98.1 F (36.7 C)  98.1 F (36.7 C) 98.2 F (36.8 C)  TempSrc:   Axillary   SpO2: 95%   100%  Weight:       Height:        General exam: Middle-age female, moderately built and obese lying comfortably propped up in bed without distress.  Patient was examined along with her female RN in room who assisted with taking down right upper extremity dressing. Respiratory system: Clear to auscultation. Respiratory effort normal. Cardiovascular system: S1 & S2 heard, RRR. No JVD, murmurs, rubs, gallops or clicks. No pedal edema. Gastrointestinal system: Abdomen is nondistended, soft and nontender. No organomegaly or masses felt. Normal bowel sounds heard. Central nervous system: Alert and oriented. No focal neurological deficits. Extremities: Symmetric 5 x 5 power. Skin: Right upper extremity with approximately quarter sized wound in the mid volar aspect of forearm with some protruding hematoma, otherwise surgical incision has healed and no acute findings, drainage or features suggestive of cellulitis.  Some induration underneath the wound, likely related to the hematoma but no fluctuance.  Compartments soft.  Peripheral pulses well felt.  Right AKA with staples, small area on lateral aspect with mild dehiscence with dried blood. Psychiatry: Judgement and insight appear impaired. Mood & affect appropriate.     Data Reviewed:   I have personally reviewed following labs and imaging studies   CBC: Recent Labs  Lab 01/15/22 1531 01/16/22  1517 01/17/22 0539 01/19/22 0444  WBC 9.2 7.5 7.9 6.1  NEUTROABS 8.3*  --  6.6 4.9  HGB 10.1* 9.4* 9.7* 9.1*  HCT 32.9* 30.2* 30.9* 29.6*  MCV 103.1* 104.1* 101.0* 102.8*  PLT 106* 89* 79* 76*    Basic Metabolic Panel: Recent Labs  Lab 01/14/22 1211 01/15/22 1531 01/16/22 0934 01/17/22 0539 01/19/22 0444 01/20/22 0540  NA 144 145 143 141 143 141  K 4.6 4.9 4.3 3.7 3.9 3.7  CL 102 103 106 105 111 109  CO2 35* 34* 34* 29 29 28   GLUCOSE 171* 258* 94 66* 83 89  BUN 52* 50* 41* 35* 28* 23  CREATININE 1.10* 1.15* 0.87 0.91 0.84 0.87  CALCIUM 10.2 10.2 9.3  9.4 9.3 9.1  MG 1.9  --  2.0 1.8  --  2.2    Liver Function Tests: Recent Labs  Lab 01/15/22 1531  AST 26  ALT 44  ALKPHOS 84  BILITOT 0.5  PROT 5.7*  ALBUMIN 2.9*    CBG: Recent Labs  Lab 01/19/22 2046 01/20/22 0752 01/20/22 1137  GLUCAP 127* 112* 91    Microbiology Studies:   Recent Results (from the past 240 hour(s))  Blood Culture (routine x 2)     Status: None   Collection Time: 01/15/22  3:20 PM   Specimen: BLOOD  Result Value Ref Range Status   Specimen Description   Final    BLOOD BLOOD LEFT FOREARM Performed at [facility], [place] [address], [place], [place] [address]    Special Requests   Final    BOTTLES DRAWN AEROBIC AND ANAEROBIC Blood Culture results may not be optimal due to an excessive volume of blood received in culture bottles Performed at [facility] [address], [place], [place] [address]    Culture   Final    NO GROWTH 5 DAYS Performed at [facility], [place] [address] [place], [place] [address]    Report Status 01/20/2022 FINAL  Final  Resp Panel by RT-PCR (Flu A&B, Covid) Anterior Nasal Swab     Status: None   Collection Time: 01/15/22  3:31 PM   Specimen: Anterior Nasal Swab  Result Value Ref Range Status   SARS Coronavirus 2 by RT PCR NEGATIVE NEGATIVE Final    Comment: (NOTE) SARS-CoV-2 target nucleic acids are NOT DETECTED.  The SARS-CoV-2 RNA is generally detectable in upper respiratory specimens during the acute phase of infection. The lowest concentration of SARS-CoV-2 viral copies this assay can detect is 138 copies/mL. A negative result does not preclude SARS-Cov-2 infection and should not be used as the sole basis for treatment or other patient management decisions. A negative result may occur with  improper specimen collection/handling, submission of specimen other than nasopharyngeal swab, presence of viral mutation(s) within the areas targeted by this assay, and inadequate  number of viral copies(<138 copies/mL). A negative result must be combined with clinical observations, patient history, and epidemiological information. The expected result is Negative.  Fact Sheet for Patients:  EntrepreneurPulse.com.au  Fact Sheet for Healthcare Providers:  IncredibleEmployment.be  This test is no t yet approved or cleared by the [place] FDA and  has been authorized for detection and/or diagnosis of SARS-CoV-2 by FDA under an Emergency Use Authorization (EUA). This EUA will remain  in effect (meaning this test can be used) for the duration of the COVID-19 declaration under Section 564(b)(1) of the Act, 21 U.S.C.section 360bbb-3(b)(1), unless the authorization is terminated  or  revoked sooner.       Influenza A by PCR NEGATIVE NEGATIVE Final   Influenza B by PCR NEGATIVE NEGATIVE Final    Comment: (NOTE) The Xpert Xpress SARS-CoV-2/FLU/RSV plus assay is intended as an aid in the diagnosis of influenza from Nasopharyngeal swab specimens and should not be used as a sole basis for treatment. Nasal washings and aspirates are unacceptable for Xpert Xpress SARS-CoV-2/FLU/RSV testing.  Fact Sheet for Patients: EntrepreneurPulse.com.au  Fact Sheet for Healthcare Providers: IncredibleEmployment.be  This test is not yet approved or cleared by the [place] FDA and has been authorized for detection and/or diagnosis of SARS-CoV-2 by FDA under an Emergency Use Authorization (EUA). This EUA will remain in effect (meaning this test can be used) for the duration of the COVID-19 declaration under Section 564(b)(1) of the Act, 21 U.S.C. section 360bbb-3(b)(1), unless the authorization is terminated or revoked.  Performed at [facility], [place] [address], [place], [place] [address]   Blood Culture (routine x 2)     Status: None (Preliminary result)   Collection Time:  01/16/22  6:01 AM   Specimen: BLOOD LEFT FOREARM  Result Value Ref Range Status   Specimen Description   Final    BLOOD LEFT FOREARM Performed at [facility] [address], [place], [name] [address]    Special Requests   Final    BOTTLES DRAWN AEROBIC ONLY Blood Culture adequate volume Performed at [facility] [address], [place], [place] [address]    Culture   Final    NO GROWTH 4 DAYS Performed at [facility], [place] [address] [place], [place] [address]    Report Status PENDING  Incomplete    Radiology Studies:  No results found.  Scheduled Meds:    bictegravir-emtricitabine-tenofovir AF  1 tablet Oral Daily   carvedilol  12.5 mg Oral BID WC   cephALEXin  500 mg Oral Q6H   docusate sodium  100 mg Oral Daily   hydrALAZINE  25 mg Oral Q8H   insulin aspart  0-5 Units Subcutaneous QHS   insulin aspart  0-9 Units Subcutaneous TID WC   insulin detemir  8 Units Subcutaneous QHS   pantoprazole  40 mg Oral Daily   rivaroxaban  20 mg Oral Q supper    Continuous Infusions:     LOS: 4 days     Vernell Leep, MD,  FACP, [facility], [facility], [facility] (Care Management Physician Certified) [place]  To contact the attending provider between 7A-7P or the covering provider during after hours 7P-7A, please log into the web site [email or web address] and access using universal [place] password for that web site. If you do not have the password, please call the hospital operator.  01/20/2022, 2:30 PM

## 2022-01-20 NOTE — TOC Progression Note (Signed)
Transition of Care [facility]) - Progression Note    Patient Details  Name: Shannon [name] MRN: [id number] Date of Birth: 1956-03-04  Transition of Care [facility]) CM/SW Contact  Ross Ludwig, Vivian Phone Number: 01/20/2022, 3:38 PM  Clinical Narrative:     CSW was informed that patient is not medically ready for discharge yet.  CSW contacted [facility] and they said they can still accept patient pending insurance authorization.  CSW to continue to follow patient's progress throughout discharge planning.   Expected Discharge Plan: Skilled Nursing Facility Barriers to Discharge: Ship broker, Continued Medical Work up  Expected Discharge Plan and Services Expected Discharge Plan: [place] Choice: [place] Living arrangements for the past 2 months: Apartment                                       Social Determinants of Health (SDOH) Interventions    Readmission Risk Interventions    12/22/2021    9:44 AM  Readmission Risk Prevention Plan  Transportation Screening Complete  Medication Review Press photographer) Complete  HRI or Home Care Consult Complete  SW Recovery Care/Counseling Consult Complete  Palliative Care Screening Not Applicable  Skilled Nursing Facility Complete

## 2022-01-20 NOTE — Progress Notes (Signed)
ThePatient ID: Shannon [name], female   DOB: Jul 10, 1956, 66 y.o.   MRN: [id number] Patient is seen in follow-up for right above-the-knee amputation.  Surgical incision is well approximated the mid aspect of the wound has some slight dehiscence with some redness around the edges.  There is clear serosanguineous drainage.  Would have her continue with daily cleansing dry dressing changes daily and would discharge on oral doxycycline 100 mg twice a day for 2 weeks.  I will follow-up in the office in 1 week.

## 2022-01-20 NOTE — Progress Notes (Addendum)
ID progress note  Question -- duration abx for cellulitis of LUE  Reviewed chart/discussed with primary team  Subjective: Discussed with nursing staff; rue redness Rle stump with concern of wound dehiscence and redness. Dr Sharol Given had wanted to cover for potential soft tissue infection there No fever, chill   Per previous ID consult 66 y.o. 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 She was seen by dr Earlie Server on April 6th to discuss possible treatment options. She was no show at ID appt with me in mid April. She did attend onc visit on 4/26 that where she complained of right leg pain on exam had significant edema, discoloration, bullae formation.she was admitted directly to hospital for further management. Imaging concerning for nec fascitis. Ortho able to do I X D of lesion to anterior tibia and some to dorsum of foot. She was placed on linezolid, cefepime, and metronidazole. Cx growing GNR and GPC, preliminary is kleb pneumonia. She still is soloment.  Blood gas this am showing respiratory acidosis- showing elevated pCO2.  Course complicated by candidaemia source suspected from nec-fasc; repeat bcx 5/01 negative.   She had finished 14 day abx by 5/15 for fluconazole and on 5/08 amp-sulb  S/p aka 5/05 rle  She is continued on HIV ART with biktarvy; this is well controlled Lab Results  Component Value Date   HIV1RNAQUANT Not Detected 11/25/2021   Lab Results  Component Value Date   CD4TCELL 32 (L) 11/25/2021   CD4TABS 143 (L) 11/25/2021   08/27/2021 cd4 474. Suspect drop in setting skin soft tissue infection and not reflective of HIV control    She was discharged but readmitted 5/25 for concern RUE slight  serous oozing and concern for cellulitis. Restarted on bsABx. Ortho evaluated without concern for infection  I reviewed picture no clear cellulitic change except small spots of skipped erythema of <1inch on dorsum of forearm  She clinically continue to look well without sign of cellulitis as of 5/30   Objective: Vitals:   01/20/22 0400 01/20/22 1354  BP: 138/84 (!) 169/92  Pulse: 91 90  Resp: 19 20  Temp: 98.1 F (36.7 C) 98.2 F (36.8 C)  SpO2:  100%    Physical exam: General/constitutional: no distress, sleeping; arousable but quickly go back to sleep; minimally conversant HEENT: Normocephalic, PER, Conj Clear, EOMI, Oropharynx clear Neck supple CV: rrr no mrg Lungs: clear to auscultation, normal respiratory effort Abd: Soft, Nontender Ext/skin/msk: right aka stump sutures intact; dusky appearing; small spot of oozing fibrinous vs mucoid discharge mixed with serosanguinous material; patient grimaced a bit when I palpated --> no fluctuance; it is not cold to touch; no expressed purulence on palpation; rue slight oozing serous material from wound Neuro: sleeping not assessed   Labs: Lab Results  Component Value Date   WBC 6.1 01/19/2022   HGB 9.1 (L) 01/19/2022   HCT 29.6 (L) 01/19/2022   MCV 102.8 (H) 01/19/2022   PLT 76 (L) 56/43/3295   Last metabolic panel Lab Results  Component Value Date   GLUCOSE 89 01/20/2022   NA 141 01/20/2022   K 3.7 01/20/2022   CL 109 01/20/2022   CO2 28 01/20/2022   BUN 23 01/20/2022   CREATININE 0.87 01/20/2022  GFRNONAA >60 01/20/2022   CALCIUM 9.1 01/20/2022   PHOS 3.2 02/17/2020   PROT 5.7 (L) 01/15/2022   ALBUMIN 2.9 (L) 01/15/2022   LABGLOB 2.4 03/12/2021   AGRATIO 1.8 03/12/2021   BILITOT 0.5 01/15/2022   ALKPHOS 84 01/15/2022   AST 26 01/15/2022   ALT 44 01/15/2022   ANIONGAP 4 (L) 01/20/2022     Imaging: None today  Abx: 5/25-c vanc/cefepime/flagyl  I do not see further active sign of soft tissue infection  in the RUE.  The RLE difficult to appreciate infectious process (no other objectives to suggest such as fever, leukocytosis), but fibrinous/thick exudate and slight area of dehiscence and stump appear dusky. Ortho had evaluated and wanted to cover empirically for potential soft tissue infection there, which I think is reasonable. I would trend crp q72 hours and see if there is any trend. I consider imaging to look for deeper abscess but at this point it is rather still early after initial surgery to have some meaningful interpretation based on what we see on exam      -agree if ortho want to cover for soft tissue infection in the right LE stump.  -I would change to doxy/augmentin for that and do another 10 days -I would keep here another 2-3 days to see how she does and also with serial crp to help assess her status -the course above should satisfy that of the RLE initial concern for cellulitis as well  -she has appointment with Dr Baxter Flattery on 7/10@11am   -continue biktarvy for hiv otherwise  -discuss with primary team     I spent more than 50 minute reviewing data/chart, and coordinating care and >50% direct face to face time providing counseling/discussing diagnostics/treatment plan with patient

## 2022-01-20 NOTE — Progress Notes (Signed)
Pharmacy Antibiotic Note  Shannon [name] is a 66 y.o. female admitted on 01/15/2022 with  right upper extremity wound .  Pt recently had a prolonged hospitalization from 4/26 - 01/14/22 with necrotizing fasciitis of the right foot requiring AKA on 12/26/21. Pt also had right forearm infection requiring fasciotomy and fungemia and competed antifungals/antibiotics prior to discharge.   Pt was re-admitted on 5/25 with RUE swelling and serosanguineous discharge. Broad spectrum antibiotics initiated - pharmacy consulted to dose vancomycin and cefepime.  Today, 01/20/22 WBC remains WNL SCr stable, WNL. CrCl ~65 mL/min Afebrile  Today is day #6 of IV antibiotics  Plan: Continue cefepime 2 g IV q8h + metronidazole 500 mg IV q12h Continue vancomycin 1000 mg IV q24h for estimated AUC of 460 Goal vancomycin AUC 400-550. Check levels at steady state as needed Monitor renal function for necessary dose adjustments  Height: 5\' 4"  (162.6 cm) Weight: 91.1 kg (200 lb 14.4 oz) IBW/kg (Calculated) : 54.7  Temp (24hrs), Avg:98.3 F (36.8 C), Min:98.1 F (36.7 C), Max:98.7 F (37.1 C)  Recent Labs  Lab 01/15/22 1531 01/16/22 0934 01/17/22 0539 01/19/22 0444 01/20/22 0540  WBC 9.2 7.5 7.9 6.1  --   CREATININE 1.15* 0.87 0.91 0.84 0.87  LATICACIDVEN 1.0  --   --   --   --     Estimated Creatinine Clearance: 68.6 mL/min (by C-G formula based on SCr of 0.87 mg/dL).    Allergies  Allergen Reactions   Lisinopril Swelling and Cough    Face/throat swelling   Tree Extract Swelling and Other (See Comments)    Swelling to eyes   Augmentin [Amoxicillin-Pot Clavulanate] Other (See Comments)    Headache, dizzy   Ciprofloxacin Hives    Antimicrobials this admission: cefepime 5/25 >>  vancomycin 5/25 >>  Metronidazole 5/25 >>  Dose adjustments this admission:  Microbiology results: 5/26 BCx: ngtd  Lenis Noon, PharmD 01/20/2022 11:05 AM

## 2022-01-20 NOTE — Consult Note (Signed)
[facility] Inpatient Consult   01/20/2022  TYJAE SHVARTSMAN [birth year] [phone]  [place] [facility] CM)   Patient chart has been reviewed with noted high risk score for unplanned readmissions and unplanned 30 day readmission.  Patient assessed for community [place] Management follow up needs. Per review, current recommendation is for SNF. No [facility] CM needs.   Of note, [facility] Care Management services does not replace or interfere with any services that are arranged by inpatient case management or social work.    Netta Cedars, MSN, RN [facility] Liaison Toll free office [phone]

## 2022-01-20 NOTE — Progress Notes (Signed)
PT Cancellation Note  Patient Details Name: [name] MRN: [id number] DOB: 1955/11/09   Cancelled Treatment:    Reason Eval/Treat Not Completed: Fatigue/lethargy limiting ability to participate (Pt asleep, unable to rouse with vocal or tactile methods, RN aware.) will check back as schedule and pt status allows.  Coolidge Breeze, PT, DPT [place] [facility] Department Office: [phone] Pager: [phone]  Coolidge Breeze 01/20/2022, 3:08 PM

## 2022-01-21 DIAGNOSIS — L03113 Cellulitis of right upper limb: Secondary | ICD-10-CM | POA: Diagnosis not present

## 2022-01-21 DIAGNOSIS — T8149XA Infection following a procedure, other surgical site, initial encounter: Secondary | ICD-10-CM | POA: Diagnosis not present

## 2022-01-21 DIAGNOSIS — B20 Human immunodeficiency virus [HIV] disease: Secondary | ICD-10-CM | POA: Diagnosis not present

## 2022-01-21 LAB — CULTURE, BLOOD (ROUTINE X 2)
Culture: NO GROWTH
Culture: NO GROWTH
Special Requests: ADEQUATE
Special Requests: ADEQUATE

## 2022-01-21 LAB — GLUCOSE, CAPILLARY
Glucose-Capillary: 70 mg/dL (ref 70–99)
Glucose-Capillary: 115 mg/dL — ABNORMAL HIGH (ref 70–99)
Glucose-Capillary: 74 mg/dL (ref 70–99)
Glucose-Capillary: 105 mg/dL — ABNORMAL HIGH (ref 70–99)

## 2022-01-21 LAB — CBC
HCT: 26.9 % — ABNORMAL LOW (ref 36.0–46.0)
Hemoglobin: 8.7 g/dL — ABNORMAL LOW (ref 12.0–15.0)
MCH: 33.3 pg (ref 26.0–34.0)
MCHC: 32.3 g/dL (ref 30.0–36.0)
MCV: 103.1 fL — ABNORMAL HIGH (ref 80.0–100.0)
Platelets: 77 10*3/uL — ABNORMAL LOW (ref 150–400)
RBC: 2.61 MIL/uL — ABNORMAL LOW (ref 3.87–5.11)
RDW: 16.1 % — ABNORMAL HIGH (ref 11.5–15.5)
WBC: 4.3 10*3/uL (ref 4.0–10.5)
nRBC: 0 % (ref 0.0–0.2)

## 2022-01-21 MED ORDER — POTASSIUM CHLORIDE CRYS ER 20 MEQ PO TBCR
40.0000 meq | EXTENDED_RELEASE_TABLET | Freq: Once | ORAL | Status: AC
  Administered 2022-01-21: 40 meq via ORAL
  Filled 2022-01-21: qty 2

## 2022-01-21 NOTE — Progress Notes (Signed)
Occupational Therapy Treatment Patient Details Name: Shannon [name] MRN: [id number] DOB: 21-Dec-1955 Today's Date: 01/21/2022   History of present illness patient is a 66 year old female who was recently at this hospital from 4/26 to 5/24 with d/c to SNF for continuation of skilled services. patient presented with incresed edema and discharge from RUE. patient was admitted with cellulitis of right arm.  PMH: CVA, fungemia, hypoxic respiratory failure, AKA on 5/5, right forearm infection with fasciotomy and biopsy, HIV, CKD, DM II, CHF, atrial flutter, GERD, morbid obesity,   OT comments  Pt was seen by OT to increase I and safety with ADLs and ADL transfers. Pt. Was able to sit eob for 10 min without lob. Attempted to stand patient 2 times with 2 person assist and was unable to clear hips. Pt. Worked on bed mobility, ue strengthening and adls. Acute ot to follow.    Recommendations for follow up therapy are one component of a multi-disciplinary discharge planning process, led by the attending physician.  Recommendations may be updated based on patient status, additional functional criteria and insurance authorization.    Follow Up Recommendations  Skilled nursing-short term rehab (<3 hours/day)    Assistance Recommended at Discharge Frequent or constant Supervision/Assistance  Patient can return home with the following  Assistance with cooking/housework;Direct supervision/assist for medications management;Direct supervision/assist for financial management;Assist for transportation;Help with stairs or ramp for entrance;Two people to help with walking and/or transfers;A lot of help with bathing/dressing/bathroom   Equipment Recommendations  Other (comment)    Recommendations for Other Services      Precautions / Restrictions Precautions Precautions: Fall Precaution Comments: R AKA, moves quick - be close Required Braces or Orthoses: Other Brace Splint/Cast: none Restrictions Weight  Bearing Restrictions: Yes RLE Weight Bearing: Non weight bearing Other Position/Activity Restrictions: Recent R AKA       Mobility Bed Mobility Overal bed mobility: Needs Assistance Bed Mobility: Rolling Rolling: Min assist Sidelying to sit: HOB elevated, Min assist Supine to sit: Min assist Sit to supine: Min assist   General bed mobility comments: Pt. able to scoot to edge of bed with use of bedrail.    Transfers                         Balance     Sitting balance-Leahy Scale: Fair                                     ADL either performed or assessed with clinical judgement   ADL Overall ADL's : Needs assistance/impaired Eating/Feeding: Independent;Sitting   Grooming: Wash/dry face;Min guard;Sitting   Upper Body Bathing: Moderate assistance;Bed level   Lower Body Bathing: Total assistance;Bed level   Upper Body Dressing : Moderate assistance;Bed level   Lower Body Dressing: Maximal assistance;Bed level               Functional mobility during ADLs:  (Pt. unable to stand with 2 person assist.) General ADL Comments: Pt. able to follow directions but is easily distracted and needs increased assist secondary to cognition,    Extremity/Trunk Assessment Upper Extremity Assessment Upper Extremity Assessment: Generalized weakness   Lower Extremity Assessment Lower Extremity Assessment: Defer to PT evaluation        Vision   Vision Assessment?: No apparent visual deficits   Perception     Praxis      Cognition Arousal/Alertness:  Awake/alert Behavior During Therapy: Restless, Impulsive Overall Cognitive Status: No family/caregiver present to determine baseline cognitive functioning Area of Impairment: Memory, Problem solving, Safety/judgement                     Memory: Decreased short-term memory Following Commands: Follows one step commands consistently Safety/Judgement: Decreased awareness of safety, Decreased  awareness of deficits   Problem Solving: Requires verbal cues, Difficulty sequencing, Decreased initiation, Slow processing General Comments: easily distracted        Exercises Exercises:  (B shld, elbow, hand flex and ext 10 times 3 sets.)    Shoulder Instructions       General Comments      Pertinent Vitals/ Pain       Pain Assessment Pain Assessment: 0-10 Pain Score: 6  Pain Location: R ue and R le Pain Descriptors / Indicators: Burning, Sore Pain Intervention(s): Patient requesting pain meds-RN notified  Home Living                                          Prior Functioning/Environment              Frequency  Min 2X/week        Progress Toward Goals  OT Goals(current goals can now be found in the care plan section)  Progress towards OT goals: Progressing toward goals  Acute Rehab OT Goals Patient Stated Goal: to get better OT Goal Formulation: Patient unable to participate in goal setting Time For Goal Achievement: 02/01/22 Potential to Achieve Goals: Good ADL Goals Pt Will Perform Grooming: with supervision;sitting Pt Will Perform Upper Body Bathing: with supervision;sitting Pt Will Perform Upper Body Dressing: with set-up;sitting Pt Will Transfer to Toilet: with mod assist;anterior/posterior transfer;with transfer board Pt Will Perform Toileting - Clothing Manipulation and hygiene: with mod assist;sitting/lateral leans;sit to/from stand Additional ADL Goal #1: patient will tolerate 5 mins of sitting activity in order to participate in self care tasks.  Plan Discharge plan remains appropriate    Co-evaluation                 AM-PAC OT "6 Clicks" Daily Activity     Outcome Measure   Help from another person eating meals?: A Little Help from another person taking care of personal grooming?: A Little Help from another person toileting, which includes using toliet, bedpan, or urinal?: Total Help from another person bathing  (including washing, rinsing, drying)?: A Lot Help from another person to put on and taking off regular upper body clothing?: A Lot Help from another person to put on and taking off regular lower body clothing?: A Lot 6 Click Score: 13    End of Session Equipment Utilized During Treatment: Oxygen  OT Visit Diagnosis: Other abnormalities of gait and mobility (R26.89);Pain   Activity Tolerance Patient tolerated treatment well   Patient Left in bed;with call bell/phone within reach;with bed alarm set;with nursing/sitter in room   Nurse Communication Patient requests pain meds        Time: 9983-3825 OT Time Calculation (min): 42 min  Charges: OT General Charges $OT Visit: 1 Visit OT Treatments $Self Care/Home Management : 23-37 mins $Therapeutic Exercise: 8-22 mins  Reece Packer OT/L   Shannon [name] 01/21/2022, 10:27 AM

## 2022-01-21 NOTE — Progress Notes (Signed)
ID progress note  Question -- duration abx for cellulitis of LUE  Reviewed chart/discussed with primary team  Subjective: Right aka stump painful stable Improved redness/swelling per patient She feels well otherwise Rue no pain No fever   HPI: She was discharged but readmitted 5/25 for concern RUE slight serous oozing and concern for cellulitis. Restarted on bsABx. Ortho evaluated without concern for infection  I reviewed picture no clear cellulitic change except small spots of skipped erythema of <1inch on dorsum of forearm  She clinically continue to look well without sign of cellulitis as of 5/30  Ortho did want to treat as cellulitis of the aka stump given slight dehiscence and pain/redness   Objective: Vitals:   01/21/22 0336 01/21/22 1358  BP: (!) 112/98 (!) 142/91  Pulse: 89 77  Resp: 17 19  Temp: 98.5 F (36.9 C) 98.6 F (37 C)  SpO2:  99%    Physical exam: General/constitutional: no distress, pleasant; alert; conversant HEENT: Normocephalic, PER, Conj Clear, EOMI, Oropharynx clear Neck supple CV: rrr no mrg Lungs: clear to auscultation, normal respiratory effort Abd: Soft, Nontender Ext: no edema Skin: RUE dressing clean/dry; right aka stump slight erythema at edge of wound; no obvious purulence; slight tenderness on palpation; no fluctuance Neuro: nonfocal    Labs: Lab Results  Component Value Date   WBC 4.3 01/21/2022   HGB 8.7 (L) 01/21/2022   HCT 26.9 (L) 01/21/2022   MCV 103.1 (H) 01/21/2022   PLT 77 (L) 53/29/9242   Last metabolic panel Lab Results  Component Value Date   GLUCOSE 89 01/20/2022   NA 141 01/20/2022   K 3.7 01/20/2022   CL 109 01/20/2022   CO2 28 01/20/2022   BUN 23 01/20/2022   CREATININE 0.87 01/20/2022   GFRNONAA >60 01/20/2022   CALCIUM 9.1 01/20/2022   PHOS 3.2 02/17/2020   PROT 5.7 (L) 01/15/2022   ALBUMIN 2.9 (L) 01/15/2022   LABGLOB 2.4 03/12/2021   AGRATIO 1.8 03/12/2021   BILITOT 0.5 01/15/2022    ALKPHOS 84 01/15/2022   AST 26 01/15/2022   ALT 44 01/15/2022   ANIONGAP 4 (L) 01/20/2022     Imaging: None today  Abx: 5/25-c vanc/cefepime/flagyl   Assessment/pain I do not see further active sign of soft tissue infection in the RUE.  The RLE difficult to appreciate infectious process (no other objectives to suggest such as fever, leukocytosis), but fibrinous/thick exudate and slight area of dehiscence and stump appear dusky. Ortho had evaluated and wanted to cover empirically for potential soft tissue infection there, which I think is reasonable. I would trend crp q72 hours and see if there is any trend. I consider imaging to look for deeper abscess but at this point it is rather still early after initial surgery to have some meaningful interpretation based on what we see on exam   --------- 5/31 assessment Right aka stump less swollen/red  Treating as cellulitis right aka stump at this time Initial cellulitis? Of RUE had resolved  Hiv well controlled    -finish doxy/amox-clav on 6/08 -f/u ortho as per their discretion -id will sign off -discussed with primary team  -she does have hiv clinic f/u with Dr Baxter Flattery on 7/10@11am  -continue biktarvy for hiv otherwise  I spent more than 35 minute reviewing data/chart, and coordinating care and >50% direct face to face time providing counseling/discussing diagnostics/treatment plan with patient          Jabier Mutton, [facility] [phone]  pager 01/21/2022, 3:45 PM

## 2022-01-21 NOTE — Care Management Important Message (Signed)
Important Message  Patient Details IM Letter given to the Patient. Name: Shannon [name] MRN: [id number] Date of Birth: [birth year]   Medicare Important Message Given:  Yes     Kerin Salen 01/21/2022, 10:50 AM

## 2022-01-21 NOTE — Progress Notes (Signed)
Physical Therapy Treatment Patient Details Name: Shannon [name] MRN: [id number] DOB: August 01, 1956 Today's Date: 01/21/2022   History of Present Illness patient is a 66 year old female who was recently at this hospital from 4/26 to 5/24 with d/c to SNF for continuation of skilled services. patient presented with incresed edema and discharge from RUE. patient was admitted with cellulitis of right arm.  PMH: CVA, fungemia, hypoxic respiratory failure, AKA on 5/5, right forearm infection with fasciotomy and biopsy, HIV, CKD, DM II, CHF, atrial flutter, GERD, morbid obesity,    PT Comments    Pt generally refusing mobility especially bed mobility or attempts to stand, but eventually agreeable to perform bed level exercises with agreement that upon next visit pt will attempt more rigorous interventions. Pt completed BLE exercises with verbal cues for form and slowing down, reinforced pt should be completing exercises outside of therapy session. Pt continually mentioning she is hungry, provided menu and phone to pt to place food order. If pt does not mobilize out of bed this therapist is concerned for a R hip flexion contracture. We will continue to work with pt acutely.     Recommendations for follow up therapy are one component of a multi-disciplinary discharge planning process, led by the attending physician.  Recommendations may be updated based on patient status, additional functional criteria and insurance authorization.  Follow Up Recommendations  Skilled nursing-short term rehab (<3 hours/day)     Assistance Recommended at Discharge Frequent or constant Supervision/Assistance  Patient can return home with the following Two people to help with walking and/or transfers;Assistance with cooking/housework;Assist for transportation;Help with stairs or ramp for entrance;Two people to help with bathing/dressing/bathroom;Direct supervision/assist for medications management   Equipment Recommendations   None recommended by PT    Recommendations for Other Services Rehab consult     Precautions / Restrictions Precautions Precautions: Fall Precaution Comments: R AKA, moves quick - be close Required Braces or Orthoses: Other Brace Splint/Cast: none Restrictions Weight Bearing Restrictions: Yes RLE Weight Bearing: Non weight bearing Other Position/Activity Restrictions: Recent R AKA     Mobility  Bed Mobility               General bed mobility comments: Pt refused bed mobility    Transfers   Equipment used:  (stedy)               General transfer comment: Pt refused mobility    Ambulation/Gait               General Gait Details: currently non amb NEW AKA   Stairs             Wheelchair Mobility    Modified Rankin (Stroke Patients Only)       Balance                                            Cognition Arousal/Alertness: Awake/alert Behavior During Therapy: Restless, Impulsive Overall Cognitive Status: No family/caregiver present to determine baseline cognitive functioning Area of Impairment: Memory, Problem solving, Safety/judgement                 Orientation Level: Disoriented to, Time, Situation Current Attention Level: Sustained Memory: Decreased short-term memory Following Commands: Follows one step commands consistently Safety/Judgement: Decreased awareness of safety, Decreased awareness of deficits Awareness: Emergent Problem Solving: Requires verbal cues, Difficulty sequencing, Decreased initiation, Slow processing  General Comments: easily distracted        Exercises Other Exercises Other Exercises: Ankle pumps LLE x20 Other Exercises: Hip ABD/adduction b/l x10 Other Exercises: heel slides x20 LLE    General Comments        Pertinent Vitals/Pain Pain Assessment Pain Assessment: No/denies pain Faces Pain Scale: Hurts little more Pain Location: R le Pain Descriptors / Indicators:  Burning, Sore Pain Intervention(s): Limited activity within patient's tolerance, Monitored during session, Repositioned    Home Living                          Prior Function            PT Goals (current goals can now be found in the care plan section) Acute Rehab PT Goals Patient Stated Goal: get better PT Goal Formulation: With patient Time For Goal Achievement: 02/01/22 Potential to Achieve Goals: Fair Progress towards PT goals: Progressing toward goals    Frequency    Min 2X/week      PT Plan Current plan remains appropriate    Co-evaluation              AM-PAC PT "6 Clicks" Mobility   Outcome Measure  Help needed turning from your back to your side while in a flat bed without using bedrails?: A Little Help needed moving from lying on your back to sitting on the side of a flat bed without using bedrails?: A Lot Help needed moving to and from a bed to a chair (including a wheelchair)?: A Lot Help needed standing up from a chair using your arms (e.g., wheelchair or bedside chair)?: Total Help needed to walk in hospital room?: Total Help needed climbing 3-5 steps with a railing? : Total 6 Click Score: 10    End of Session Equipment Utilized During Treatment: Oxygen Activity Tolerance: Patient limited by fatigue Patient left: in bed;with call bell/phone within reach;with bed alarm set Nurse Communication: Mobility status PT Visit Diagnosis: Unsteadiness on feet (R26.81);Other abnormalities of gait and mobility (R26.89);Muscle weakness (generalized) (M62.81);Other symptoms and signs involving the nervous system (R29.898);Pain Pain - Right/Left: Right Pain - part of body: Leg (and arm)     Time: 4650-3546 PT Time Calculation (min) (ACUTE ONLY): 15 min  Charges:  $Therapeutic Exercise: 8-22 mins                     Coolidge Breeze, PT, DPT [facility] Office: [phone] Pager: [phone]   Coolidge Breeze 01/21/2022, 4:03 PM

## 2022-01-21 NOTE — Progress Notes (Signed)
PT Cancellation Note  Patient Details Name: Shannon [name] MRN: [id number] DOB: Mar 07, 1956   Cancelled Treatment:    Reason Eval/Treat Not Completed: Patient declined, no reason specified (Pt had just finished with OT session and feeling nauseated.) Pt requested return this afternoon. Will follow up as schedule and pt status allows.  Coolidge Breeze, PT, DPT [facility] Office: [phone] Pager: [phone]  Coolidge Breeze 01/21/2022, 10:52 AM

## 2022-01-21 NOTE — TOC Progression Note (Addendum)
Transition of Care [facility]) - Progression Note    Patient Details  Name: Shannon [name] MRN: [id number] Date of Birth: August 18, 1956  [facility] CM/SW Contact  Ross Ludwig, [place] Phone Number: 01/21/2022, 2:18 PM  Clinical Narrative:     CSW was informed by bedside nurse that patient's sister Sharyn Lull [phone] would like CSW to give her a call to discuss disposition plan.  CSW spoke to [place], confirmed that patient would like to go back to [facility].  Patient's sister was informed that patient has not been working with therapy the past couple of days and CSW informed her that insurance will be looking to see if she is participating or not.  Per patient's sister she is a morning person, and prefers to do therapy in the morning.  Patient's sister requested that patient's cell phone be by her and that it is always on and charged so she can reach patient.  Per patient's sister, she requested that if for someone to call her before therapy comes in to try to convince patient to work with therapy.    Patient's sister asked that when patient is ready for discharge to keep her updated so she can fly to [facility] to be here for the transition.  Patient's sister also asked that patient be discharged before noon if possible once she is ready.  CSW informed her that it will all depend on when physician does round and when they put their discharge orders in.  Patient's sister insists that if patient to be transported to SNF after 6pm that she go the next day because of the confusion and sundowning that patient experiences.  CSW told her it will be documented, but it is out of CSW's control if patient leaves after 6pm or not.  Patient's sister stated that patient has a 30 day supply of one of her HIV medications at [facility] and she can not get another 30 days supply.  [facility] is aware and should have her medication.  CSW to continue to follow patient's progress  throughout discharge planning.  Insurance authorization has been started for [facility].  CSW updated [facility] admissions worker at [facility].    Insurance auth started, reference number [id number].   Expected Discharge Plan: Skilled Nursing Facility Barriers to Discharge: Ship broker, Continued Medical Work up  Expected Discharge Plan and Services Expected Discharge Plan: [place] Choice: [facility] Living arrangements for the past 2 months: Apartment                                       Social Determinants of Health (SDOH) Interventions    Readmission Risk Interventions    12/22/2021    9:44 AM  Readmission Risk Prevention Plan  Transportation Screening Complete  Medication Review Press photographer) Complete  HRI or Home Care Consult Complete  SW Recovery Care/Counseling Consult Complete  Palliative Care Screening Not Applicable  Skilled Nursing Facility Complete

## 2022-01-21 NOTE — Progress Notes (Addendum)
PROGRESS NOTE   Shannon [name]  NKN:[id number]    DOB: Oct 03, 1955    DOA: 01/15/2022  PCP: [name], MD   I have briefly reviewed patients previous medical records in [facility].  Chief Complaint  Patient presents with   Wound Check    Brief Narrative:  66 year old female with medical history significant for recurrent and metastatic/stage IV adenocarcinoma of the lung, controlled HIV disease, COPD, chronic diastolic CHF, CVA, recent prolonged hospitalization from 12/17/2021 - 01/14/2022 for necrotizing fasciitis of right foot eventually requiring AKA on 5/5 by Dr. Sharol Given, right forearm infection requiring fasciotomy and biopsy by hand surgery, course complicated by subacute CVA, fungemia, acute hypoxic respiratory failure, she completed course of antibiotics in the hospital, wound VAC was removed prior to discharge, discharged to SNF for less than 24 hours and readmitted for worsening of right upper extremity swelling with some serosanguineous discharge and swelling.  Evaluated in ED by orthopedics/hand surgery, did not see any role for surgical intervention, empirically started on broad-spectrum IV antibiotics for suspected RUE cellulitis.  ID and orthopedics/Dr. Sharol Given consulted consulted.  Now on oral antibiotics.  ID recommended 2 to 3 days in-house monitoring of RLE stump cellulitis.   Assessment & Plan:  Principal Problem:   Cellulitis of right arm Active Problems:   HIV disease ([place])   Uncontrolled hypertension   GERD (gastroesophageal reflux disease)   Chronic kidney disease (CKD), stage III (moderate) (HCC)   CAD (coronary artery disease)   Atrial flutter (HCC)   (HFpEF) heart failure with preserved ejection fraction (HCC)   Malignant neoplasm metastatic to bone [facility])   Cellulitis   Surgical site infection   H/O necrotizing fasciitis   Right upper extremity cellulitis with concerns of serosanguineous/purulent discharge on admission - Blood cultures x2 from 5/25 and  5/26: Negative to date. -- Started on broad-spectrum antibiotics-IV vancomycin, cefepime and Flagyl on 5/25. --  Orthopedic/hand surgery, Dr. Greta Doom consulted and saw pt in ER-he did not think there were any signs of infection based on his exam, no signs of deep abscess or compartment syndrome or development of new abscess within the right upper extremity.  He recommended daily dressing changes. --  Xarelto initially held for possible surgery, but was restarted - As noted, Recently underwent fascia biopsy and fasciotomy which showed fibroadipose tissue. - ID consultation 5/30 appreciated and have transitioned to Augmentin + doxycycline x10 days to cover for this and RLE stump dehiscence site infection.  CRP 5.8 on 5/30.   Right foot abscess with necrotizing fasciitis, status post AKA 12/26/2021 -- Had completed antibiotic course for this.   -- Wound VAC was removed and was supposed to follow-up with Dr. Sharol Given in 1 week but hospitalized. Requested Dr. Sharol Given to follow up 5/30 -Dr. Sharol Given follow-up 5/30 appreciated, noted wound with slight dehiscence with some redness around the edges and clear serosanguineous drainage, recommends continuing daily dry dressing changes and oral doxycycline. -ID consultation appreciated and have placed on Augmentin + doxycycline x10 days starting 5/30 with close monitoring, trending CRP and monitoring in-house for 2 to 3 days, if concern for worsening then MRI of lower extremity.  Thrombocytopenia Unclear etiology.  Had it prior to 5/9 and then had resolved and platelet counts had rebounded to normal.  Again down since 5/22.  Stable in the 70s since 5/27.  No overt bleeding.  Follow CBC in a.m.  Consider further evaluation if has worsening.?  Related to antibiotics.   Chronic macrocytic anemia Hemoglobin essentially  stable in the 9-10 g range for last several days.  Hemoglobin slightly down at 8.7 in the absence of overt bleeding. Periodically follow CBCs.   Recent CVA of  right anterior temporal lobe - Was on Xarelto outpatient.  Neurology recommended repeat MRI brain on 02/01/2022.   HIV disease - CD4 count 68.  On [place] with [name], [place].  Has upcoming follow-up on 7/10 at 11 AM.   Diabetes mellitus type 2, insulin-dependent - A1c 7.3 on 12/17/2021.  On Decadron.  Continue Levemir, sliding scale and Accu-Cheks. Good inpatient control.   CKD stage II - Stable.   Essential hypertension - Continue carvedilol and hydralazine.   Congestive heart failure with preserved ejection fraction - Clinically euvolemic.  Continue carvedilol. Aldactone on hold and can be resumed at time of discharge.   History of atrial flutter - restarted xarelto -- Continue Coreg.  Nonsustained SVT/VT Echo in March 2023 showed normal EF. Replace potassium and magnesium to >4 and >2 respectively Continue carvedilol and telemetry.   GERD - PPI   Morbid obesity with BMI greater than 35 - Recently seen by dietitian   COPD -as needed bronchodilators.  Probably also has OSA, will order CPAP at bedtime but will need sleep study to be performed as outpatient.   Recent fungemia - Completed Diflucan on 5/15.  Seen by ID and ophthalmology.    Body mass index is 34.48 kg/m.   DVT prophylaxis:   On Xarelto   Code Status: Full Code:  Family Communication: Discussed in detail with patient's sister in [place] via phone, updated her with care and answered questions.  Advised her of the course since yesterday, ID input and possible DC to SNF on 6/1.  She was appreciative of the call.  Disposition:  Status is: Inpatient Possible DC to SNF 6/1 or 6/2.     Consultants:   ID Orthopedics  Procedures:   None  Antimicrobials:   As above   Subjective:  Seen this morning along with therapies team.  Getting ready to work with therapy.  Denied complaints.  Objective:   Vitals:   01/20/22 0400 01/20/22 1354 01/21/22 0336 01/21/22 1358  BP: 138/84 (!)  169/92 (!) 112/98 (!) 142/91  Pulse: 91 90 89 77  Resp: 19 20 17 19   Temp: 98.1 F (36.7 C) 98.2 F (36.8 C) 98.5 F (36.9 C) 98.6 F (37 C)  TempSrc: Axillary  Oral Oral  SpO2:  100%  99%  Weight:      Height:        General exam: Middle-age female, moderately built and obese lying comfortably propped up in bed without distress.  Therapy team at bedside. Respiratory system: Clear to auscultation. Respiratory effort normal. Cardiovascular system: S1 & S2 heard, RRR. No JVD, murmurs, rubs, gallops or clicks. No pedal edema.  Telemetry personally reviewed: Sinus rhythm.  Occasional episodes of nonsustained SVT and VT. Gastrointestinal system: Abdomen is nondistended, soft and nontender. No organomegaly or masses felt. Normal bowel sounds heard. Central nervous system: Alert and oriented. No focal neurological deficits. Extremities: Symmetric 5 x 5 power. Skin: Right forearm dressing clean and dry.  Did not removed to examine today.  As per nurse report, right AKA stump was dressed this morning, no drainage, minimal redness at dehiscence site. Psychiatry: Judgement and insight appear impaired. Mood & affect appropriate.     Data Reviewed:   I have personally reviewed following labs and imaging studies   CBC: Recent Labs  Lab 01/15/22  1531 01/16/22 0934 01/17/22 0539 01/19/22 0444 01/21/22 0535  WBC 9.2   < > 7.9 6.1 4.3  NEUTROABS 8.3*  --  6.6 4.9  --   HGB 10.1*   < > 9.7* 9.1* 8.7*  HCT 32.9*   < > 30.9* 29.6* 26.9*  MCV 103.1*   < > 101.0* 102.8* 103.1*  PLT 106*   < > 79* 76* 77*   < > = values in this interval not displayed.    Basic Metabolic Panel: Recent Labs  Lab 01/15/22 1531 01/16/22 0934 01/17/22 0539 01/19/22 0444 01/20/22 0540  NA 145 143 141 143 141  K 4.9 4.3 3.7 3.9 3.7  CL 103 106 105 111 109  CO2 34* 34* 29 29 28   GLUCOSE 258* 94 66* 83 89  BUN 50* 41* 35* 28* 23  CREATININE 1.15* 0.87 0.91 0.84 0.87  CALCIUM 10.2 9.3 9.4 9.3 9.1  MG  --   2.0 1.8  --  2.2    Liver Function Tests: Recent Labs  Lab 01/15/22 1531  AST 26  ALT 44  ALKPHOS 84  BILITOT 0.5  PROT 5.7*  ALBUMIN 2.9*    CBG: Recent Labs  Lab 01/20/22 2235 01/21/22 0739 01/21/22 1220  GLUCAP 103* 70 115*    Microbiology Studies:   Recent Results (from the past 240 hour(s))  Blood Culture (routine x 2)     Status: None   Collection Time: 01/15/22  3:20 PM   Specimen: BLOOD  Result Value Ref Range Status   Specimen Description   Final    BLOOD BLOOD LEFT FOREARM Performed at [facility], [place] [address], [place], [place] [address]    Special Requests   Final    BOTTLES DRAWN AEROBIC AND ANAEROBIC Blood Culture results may not be optimal due to an excessive volume of blood received in culture bottles Performed at [facility] [address], [place], [place] [address]    Culture   Final    NO GROWTH 5 DAYS Performed at [facility], [place] [address] [place], [place] [address]    Report Status 01/20/2022 FINAL  Final  Resp Panel by RT-PCR (Flu A&B, Covid) Anterior Nasal Swab     Status: None   Collection Time: 01/15/22  3:31 PM   Specimen: Anterior Nasal Swab  Result Value Ref Range Status   SARS Coronavirus 2 by RT PCR NEGATIVE NEGATIVE Final    Comment: (NOTE) SARS-CoV-2 target nucleic acids are NOT DETECTED.  The SARS-CoV-2 RNA is generally detectable in upper respiratory specimens during the acute phase of infection. The lowest concentration of SARS-CoV-2 viral copies this assay can detect is 138 copies/mL. A negative result does not preclude SARS-Cov-2 infection and should not be used as the sole basis for treatment or other patient management decisions. A negative result may occur with  improper specimen collection/handling, submission of specimen other than nasopharyngeal swab, presence of viral mutation(s) within the areas targeted by this assay, and inadequate number of  viral copies(<138 copies/mL). A negative result must be combined with clinical observations, patient history, and epidemiological information. The expected result is Negative.  Fact Sheet for Patients:  EntrepreneurPulse.com.au  Fact Sheet for Healthcare Providers:  IncredibleEmployment.be  This test is no t yet approved or cleared by the [place] FDA and  has been authorized for detection and/or diagnosis of SARS-CoV-2 by FDA under an Emergency Use Authorization (EUA). This EUA will remain  in effect (meaning this  test can be used) for the duration of the COVID-19 declaration under Section 564(b)(1) of the Act, 21 U.S.C.section 360bbb-3(b)(1), unless the authorization is terminated  or revoked sooner.       Influenza A by PCR NEGATIVE NEGATIVE Final   Influenza B by PCR NEGATIVE NEGATIVE Final    Comment: (NOTE) The Xpert Xpress SARS-CoV-2/FLU/RSV plus assay is intended as an aid in the diagnosis of influenza from Nasopharyngeal swab specimens and should not be used as a sole basis for treatment. Nasal washings and aspirates are unacceptable for Xpert Xpress SARS-CoV-2/FLU/RSV testing.  Fact Sheet for Patients: EntrepreneurPulse.com.au  Fact Sheet for Healthcare Providers: IncredibleEmployment.be  This test is not yet approved or cleared by the [place] FDA and has been authorized for detection and/or diagnosis of SARS-CoV-2 by FDA under an Emergency Use Authorization (EUA). This EUA will remain in effect (meaning this test can be used) for the duration of the COVID-19 declaration under Section 564(b)(1) of the Act, 21 U.S.C. section 360bbb-3(b)(1), unless the authorization is terminated or revoked.  Performed at [facility], [place] [address], [place], [place] [address]   Blood Culture (routine x 2)     Status: None   Collection Time: 01/16/22  6:01 AM   Specimen: BLOOD  LEFT FOREARM  Result Value Ref Range Status   Specimen Description   Final    BLOOD LEFT FOREARM Performed at [facility] [address], [place], [place] [address]    Special Requests   Final    BOTTLES DRAWN AEROBIC ONLY Blood Culture adequate volume Performed at [facility] [address], [place], [place] [address]    Culture   Final    NO GROWTH 5 DAYS Performed at [facility], [place] [address] [place], [place] [address]    Report Status 01/21/2022 FINAL  Final  Blood Culture (routine x 2)     Status: None   Collection Time: 01/16/22  9:34 AM   Specimen: BLOOD LEFT HAND  Result Value Ref Range Status   Specimen Description   Final    BLOOD LEFT HAND Performed at [facility] [address], [place], [place] [address]    Special Requests   Final    IN PEDIATRIC BOTTLE Blood Culture adequate volume Performed at [facility] [address] [place], [place] [address]    Culture   Final    NO GROWTH 5 DAYS Performed at [facility], [place] [address], [place],  [address]    Report Status 01/21/2022 FINAL  Final    Radiology Studies:  No results found.  Scheduled Meds:    amoxicillin-clavulanate  1 tablet Oral Q12H   bictegravir-emtricitabine-tenofovir AF  1 tablet Oral Daily   carvedilol  12.5 mg Oral BID WC   docusate sodium  100 mg Oral Daily   doxycycline  100 mg Oral Q12H   hydrALAZINE  25 mg Oral Q8H   insulin aspart  0-5 Units Subcutaneous QHS   insulin aspart  0-9 Units Subcutaneous TID WC   insulin detemir  8 Units Subcutaneous QHS   pantoprazole  40 mg Oral Daily   potassium chloride  40 mEq Oral Once   rivaroxaban  20 mg Oral Q supper    Continuous Infusions:     LOS: 5 days     Vernell Leep, MD,  FACP, [facility], [facility], [facility] (Care Management Physician Certified) [place]  To contact the attending provider  between 7A-7P or the covering provider during after hours 7P-7A, please log into the web site [email or web address] and access using universal [facility] password for that web site. If you do not have the password, please call the hospital operator.  01/21/2022, 4:31 PM

## 2022-01-22 DIAGNOSIS — L03113 Cellulitis of right upper limb: Secondary | ICD-10-CM | POA: Diagnosis not present

## 2022-01-22 LAB — BASIC METABOLIC PANEL
Anion gap: 5 (ref 5–15)
BUN: 16 mg/dL (ref 8–23)
CO2: 28 mmol/L (ref 22–32)
Calcium: 9.5 mg/dL (ref 8.9–10.3)
Chloride: 108 mmol/L (ref 98–111)
Creatinine, Ser: 0.86 mg/dL (ref 0.44–1.00)
GFR, Estimated: >60 mL/min (ref >60)
Glucose, Bld: 85 mg/dL (ref 70–99)
Potassium: 3.5 mmol/L (ref 3.5–5.1)
Sodium: 141 mmol/L (ref 135–145)

## 2022-01-22 LAB — MAGNESIUM: Magnesium: 1.8 mg/dL (ref 1.7–2.4)

## 2022-01-22 LAB — CBC
HCT: 30 % — ABNORMAL LOW (ref 36.0–46.0)
Hemoglobin: 9.1 g/dL — ABNORMAL LOW (ref 12.0–15.0)
MCH: 31.3 pg (ref 26.0–34.0)
MCHC: 30.3 g/dL (ref 30.0–36.0)
MCV: 103.1 fL — ABNORMAL HIGH (ref 80.0–100.0)
Platelets: 91 10*3/uL — ABNORMAL LOW (ref 150–400)
RBC: 2.91 MIL/uL — ABNORMAL LOW (ref 3.87–5.11)
RDW: 15.9 % — ABNORMAL HIGH (ref 11.5–15.5)
WBC: 4.3 10*3/uL (ref 4.0–10.5)
nRBC: 0 % (ref 0.0–0.2)

## 2022-01-22 LAB — GLUCOSE, CAPILLARY
Glucose-Capillary: 81 mg/dL (ref 70–99)
Glucose-Capillary: 128 mg/dL — ABNORMAL HIGH (ref 70–99)
Glucose-Capillary: 119 mg/dL — ABNORMAL HIGH (ref 70–99)
Glucose-Capillary: 129 mg/dL — ABNORMAL HIGH (ref 70–99)

## 2022-01-22 LAB — C-REACTIVE PROTEIN: CRP: 2.4 mg/dL — ABNORMAL HIGH (ref <1.0)

## 2022-01-22 MED ORDER — ACETAMINOPHEN 325 MG PO TABS: 650.0000 mg | ORAL_TABLET | Freq: Four times a day (QID) | ORAL | Status: AC | PRN

## 2022-01-22 MED ORDER — ALBUTEROL SULFATE (2.5 MG/3ML) 0.083% IN NEBU: 2.5000 mg | INHALATION_SOLUTION | RESPIRATORY_TRACT | Status: DC | PRN

## 2022-01-22 MED ORDER — POTASSIUM CHLORIDE CRYS ER 20 MEQ PO TBCR
30.0000 meq | EXTENDED_RELEASE_TABLET | ORAL | Status: AC
  Administered 2022-01-22 (×2): 30 meq via ORAL
  Filled 2022-01-22 (×2): qty 1

## 2022-01-22 MED ORDER — RIVAROXABAN 20 MG PO TABS
20.0000 mg | ORAL_TABLET | Freq: Every day | ORAL | Status: AC
Start: 2022-01-22 — End: ?

## 2022-01-22 MED ORDER — MAGNESIUM SULFATE 2 GM/50ML IV SOLN
2.0000 g | Freq: Once | INTRAVENOUS | Status: AC
Start: 2022-01-22 — End: 2022-01-22
  Administered 2022-01-22: 2 g via INTRAVENOUS
  Filled 2022-01-22: qty 50

## 2022-01-22 MED ORDER — INSULIN DETEMIR 100 UNIT/ML ~~LOC~~ SOLN: 8.0000 [IU] | Freq: Every day | SUBCUTANEOUS | Status: AC

## 2022-01-22 MED ORDER — AMOXICILLIN-POT CLAVULANATE 875-125 MG PO TABS: 1.0000 | ORAL_TABLET | Freq: Two times a day (BID) | ORAL | Status: AC

## 2022-01-22 MED ORDER — DOXYCYCLINE HYCLATE 100 MG PO TABS: 100.0000 mg | ORAL_TABLET | Freq: Two times a day (BID) | ORAL | Status: AC

## 2022-01-22 MED ORDER — OXYCODONE HCL 5 MG PO TABS: 5.0000 mg | ORAL_TABLET | Freq: Four times a day (QID) | ORAL | 0 refills | Status: AC | PRN

## 2022-01-22 MED ORDER — IPRATROPIUM-ALBUTEROL 0.5-2.5 (3) MG/3ML IN SOLN
3.0000 mL | Freq: Two times a day (BID) | RESPIRATORY_TRACT | Status: DC
  Administered 2022-01-23: 3 mL via RESPIRATORY_TRACT
  Filled 2022-01-22: qty 3

## 2022-01-22 NOTE — TOC Progression Note (Addendum)
Transition of Care [facility]) - Progression Note    Patient Details  Name: Shannon [name] MRN: [id number] Date of Birth: 05/23/1956  Transition of Care [facility]) CM/SW [place], [place] Phone Number: 01/22/2022, 1:33 PM  Clinical Narrative:    Spoke with pt's sister, Sharyn Lull [phone]) to inform her that pt is medically stable to return to SNF and are currently awaiting insurance authorization.   CSW met with patient and informed her of discharge plans and insurance authorization pending. CSW confirmed that pt had her cell phone and that it was plugged in per sisters request.   TOC will continue to check the status of insurance authorization and follow for discharge needs.   1430 Pt's insurance authorization came back and has been sent to [facility]. Auth ID: [id number]. Pt's sister has been informed of this and is agreeable for pt discharging as early as possible tomorrow.   Expected Discharge Plan: Skilled Nursing Facility Barriers to Discharge: Ship broker, Continued Medical Work up  Expected Discharge Plan and Services Expected Discharge Plan: [place] Choice: [place] Living arrangements for the past 2 months: Apartment Expected Discharge Date: 01/22/22                                     Social Determinants of Health (SDOH) Interventions    Readmission Risk Interventions    12/22/2021    9:44 AM  Readmission Risk Prevention Plan  Transportation Screening Complete  Medication Review [employer]) Complete  HRI or Home Care Consult Complete  SW Recovery Care/Counseling Consult Complete  Palliative Care Screening Not Applicable  Skilled Nursing Facility Complete

## 2022-01-22 NOTE — Discharge Instructions (Signed)

## 2022-01-22 NOTE — Discharge Summary (Signed)
Physician Discharge Summary  Shannon [name] OVF:[id number] DOB: [birth year]  PCP: [name], MD  Admitted from: SNF (after a very brief stay there following recent discharge from the hospital.) Discharged to: SNF  Admit date: 01/15/2022 Discharge date: 01/22/2022  Recommendations for Outpatient Follow-up:    Follow-up Information     Newt Minion, MD Follow up in 1 week(s).   Specialty: Orthopedic Surgery Contact information: [place] [address] [phone]         MD at SNF Follow up.   Why: To be seen in 2 to 3 days with repeat labs (CBC & BMP).        [name], MD. Schedule an appointment as soon as possible for a visit.   Specialty: Internal Medicine Why: To be seen upon discharge from SNF. Contact information: [place] [address] [phone]         Skeet Latch, MD .   Specialty: Cardiology Contact information: [address] [place] [address] [place] [address] [place]: None    Equipment/Devices: TBD at [facility].    Discharge Condition: Improved and stable.   Code Status: Full Code Diet recommendation:  Discharge Diet Orders (From admission, onward)     Start     Ordered   01/22/22 0000  Diet - low sodium heart healthy        01/22/22 1310   01/22/22 0000  Diet Carb Modified        01/22/22 1310             Discharge Diagnoses:  Principal Problem:   Cellulitis of right arm Active Problems:   HIV disease ([place])   Uncontrolled hypertension   GERD (gastroesophageal reflux disease)   Chronic kidney disease (CKD), stage III (moderate) (HCC)   CAD (coronary artery disease)   Atrial flutter (HCC)   (HFpEF) heart failure with preserved ejection fraction (HCC)   Malignant neoplasm metastatic to bone ([place])   Cellulitis   Surgical site infection   H/O necrotizing fasciitis   Brief Summary: 66 year old female with medical history significant for  recurrent and metastatic/stage IV adenocarcinoma of the lung, controlled HIV disease, COPD, chronic diastolic CHF, CVA, recent prolonged hospitalization from 12/17/2021 - 01/14/2022 for necrotizing fasciitis of right foot eventually requiring AKA on 12/26/21 by Dr. Sharol Given, right forearm infection requiring fasciotomy and biopsy by hand surgery, course complicated by subacute CVA, fungemia, acute hypoxic respiratory failure, she completed course of antibiotics in the hospital, wound VAC was removed prior to discharge, discharged to SNF for less than 24 hours and readmitted for worsening of right upper extremity swelling with some serosanguineous discharge and swelling.  Evaluated in ED by orthopedics/hand surgery, did not see any role for surgical intervention, empirically started on broad-spectrum IV antibiotics for suspected RUE cellulitis.  ID and orthopedics/Dr. Sharol Given consulted.       Assessment & Plan:   Right upper extremity cellulitis with concerns of serosanguineous/purulent discharge on admission - Blood cultures x3 from 5/25 and 5/26: Negative. -- Started on broad-spectrum antibiotics-IV vancomycin, cefepime and Flagyl on 5/25 to 5/29 and then switched over to oral antibiotics as noted below. --  Orthopedic/hand surgery, Dr. Greta Doom consulted and saw pt in ER-he did not think there were any signs of infection based on his exam, no signs of deep abscess or compartment syndrome or development of new abscess within  the right upper extremity.  He recommended daily dressing changes. --  Xarelto initially held for possible surgery, but was restarted - As noted, Recently underwent fascia biopsy and fasciotomy which showed fibroadipose tissue. - Infectious disease was consulted.  Please refer to their detailed consultation notes on 5/30 and 5/31.  IV antibiotics above were switched to Augmentin and doxycycline primarily to cover for RLE stump infection but treated for RUE cellulitis as well.  RUE cellulitis has  resolved.  Patient has completed antibiotic course for RUE cellulitis.  Continue dressing changes and close monitoring.   Right foot abscess with necrotizing fasciitis, status post AKA 12/26/2021 -- Had completed antibiotic course for this prior to most recent hospital discharge.   -- Wound VAC was removed and was supposed to follow-up with Dr. Sharol Given in 1 week but was re hospitalized. Requested Dr. Sharol Given to follow up 5/30 -Dr. Sharol Given follow-up 5/30 appreciated, noted wound with slight dehiscence with some redness around the edges and clear serosanguineous drainage, recommends continuing daily dry dressing changes and oral doxycycline. - Infectious disease was consulted, transitioned patient to Augmentin + doxycycline to complete a total 10-day course on 6/8, patient was monitored closely for additional couple days in the hospital, CRP was checked and is trending down, outpatient follow-up with orthopedics.  RLE stump site infection is improving.  ID has cleared for discharge to SNF. Patient has an ID follow-up appointment with [name] on 03/02/2022 at 24 AM.   Thrombocytopenia Unclear etiology.  Had it prior to 5/9 and then had resolved and platelet counts had rebounded to normal.  Again down since 5/22.  Stable in the 70s since 5/27.  No overt bleeding.  Follow CBC in a.m.  Consider further evaluation if has worsening.?  Related to antibiotics.  Platelet counts up to 91 on day of discharge.  Continue to trend at least twice a week at SNF to ensure it is heading in the right direction.  Could consider further evaluation if not improving or worsening.   Chronic macrocytic anemia Hemoglobin essentially stable in the 9-10 g range for last several days.  Periodically follow CBCs.   Recent CVA of right anterior temporal lobe - Was on Xarelto outpatient.  Neurology recommended repeat MRI brain on 02/01/2022.  This to be arranged as outpatient.   HIV disease - CD4 count 68.  On Biktarvy.  As per ID  follow-up, well controlled. -Follows with [name], [place].  Has upcoming follow-up on 03/02/22 at 11 AM.   Diabetes mellitus type 2, insulin-dependent - A1c 7.3 on 12/17/2021.  Continue close CBG monitoring, prior dose of Levemir.  Patient has really not received much of any SSI and hence will not be discharged on this.  Upon recent discharge, patient was on Decadron for bony mets and related pain but for some reason this was not continued on admission.  Checked with pharmacist and no reason why this was not continued.  Will resume at time of discharge.  Outpatient follow-up with oncology regarding management of steroids.  If CBGs consistently greater than 150 or 180, could consider starting SSI.     CKD stage II - Stable.   Essential hypertension - Continue carvedilol and hydralazine.   Congestive heart failure with preserved ejection fraction - Clinically euvolemic.  Continue carvedilol. Aldactone was on hold in the hospital and will be resumed at discharge.   History of atrial flutter Continue carvedilol and Xarelto.   Nonsustained SVT/VT Echo in March 2023 showed normal EF. Replace potassium  and magnesium to >4 and >2 respectively.  Replaced even on day of discharge. Continue carvedilol.   GERD - PPI   Morbid obesity with BMI greater than 35.  BMI however now appears to be down at 34.48. - Recently seen by dietitian   COPD/recent acute respiratory failure with hypoxia and hypercapnia/probable OSA -as needed bronchodilators.  Probably also has OSA, which can be evaluated and managed as outpatient as deemed necessary.  Could trial of CPAP at bedtime at SNF if willing to use.  During recent admission, had failed BiPAP and required intubation.  Aggressive pulmonary toilet.  Avoid oversedation.   Recent fungemia - Completed Diflucan on 5/15.  Seen by ID and ophthalmology.  Recent acute metabolic encephalopathy: RPR then was nonreactive, vitamin B-12 normal, MRI brain on 5/13 that  suggested CVA but was motion degraded and neurology recommended repeating MRI in 4 weeks.  As per sister, patient does have sundowning.  Question some degree of underlying cognitive impairment/dementia.  Could consider outpatient neuropsychiatric evaluation.  Adenocarcinoma of lung, recurrent metastatic non-small cell lung cancer: S/p curative radiotherapy to left upper lobe lung nodules 8/21 Noted to have recurrence as of February 2023 >metastatic disease involving several areas of the bones including the ribs as well as thoracic spine, lumbar spine and sacrum.  She also had numerous small solid bilateral pulmonary nodules. Underwent CT-guided core biopsy of the metastatic lesion of the posterior right ninth rib and the final pathology was consistent with adenocarcinoma of the lung Follows with Dr. Julien Nordmann On Decadron as per oncology for bony mets and on PPI for GI protection.     Body mass index is 34.48 kg/m./Obesity       Consultants:   ID Orthopedics   Procedures:   None   Discharge Instructions  Discharge Instructions     Call MD for:  difficulty breathing, headache or visual disturbances   Complete by: As directed    Call MD for:  extreme fatigue   Complete by: As directed    Call MD for:  persistant dizziness or light-headedness   Complete by: As directed    Call MD for:  persistant nausea and vomiting   Complete by: As directed    Call MD for:  redness, tenderness, or signs of infection (pain, swelling, redness, odor or green/yellow discharge around incision site)   Complete by: As directed    Call MD for:  severe uncontrolled pain   Complete by: As directed    Call MD for:  temperature >100.4   Complete by: As directed    Diet - low sodium heart healthy   Complete by: As directed    Diet Carb Modified   Complete by: As directed    Discharge wound care:   Complete by: As directed    1) Place ABD pads over the RLE amputation site, secure with tape. Change every  2 days and prn. 2) R forearm: Wound care  Every shift. Clean wound and apply xeroform, gauze/abd, and loosely cover with Kerlix. Change dressing. May perform more often if drainage increases.   Increase activity slowly   Complete by: As directed         Medication List     STOP taking these medications    morphine 15 MG 12 hr tablet Commonly known as: MS CONTIN   neomycin-bacitracin-polymyxin 400-12-4998 ointment Commonly known as: NEOSPORIN   oxyCODONE-acetaminophen 5-325 MG tablet Commonly known as: Percocet       TAKE these medications  A&D Oint Apply 1 application. topically daily as needed (rash).   acetaminophen 325 MG tablet Commonly known as: TYLENOL Take 2 tablets (650 mg total) by mouth every 6 (six) hours as needed for mild pain, fever or headache.   albuterol (2.5 MG/3ML) 0.083% nebulizer solution Commonly known as: PROVENTIL USE 1 VIAL VIA NEBULIZER EVERY 4 HOURS AS NEEDED FOR WHEEZING OR SHORTNESS OF BREATH What changed: See the new instructions.   amoxicillin-clavulanate 875-125 MG tablet Commonly known as: AUGMENTIN Take 1 tablet by mouth 2 (two) times daily. Discontinue after 01/29/2022 doses.   b complex vitamins tablet Take 1 tablet by mouth daily.   B-D SYRINGE/NEEDLE 1CC/25GX5/8 25G X 5/8" 1 ML Misc Generic drug: SYRINGE/NEEDLE (DISP) 1 ML 1 Units by Does not apply route every 30 (thirty) days.   Biktarvy 50-200-25 MG Tabs tablet Generic drug: bictegravir-emtricitabine-tenofovir AF Take 1 tablet by mouth daily.   CALCIUM + D PO Take 1 tablet by mouth every other day.   carvedilol 12.5 MG tablet Commonly known as: COREG Take 1 tablet (12.5 mg total) by mouth 2 (two) times daily with a meal.   CLEAR EYES OP Place 1 drop into both eyes daily as needed (redness).   cyanocobalamin 1000 MCG/ML injection Commonly known as: (VITAMIN B-12) ADMINISTER 1 ML(1000 MCG) IN THE MUSCLE EVERY 30 DAYS What changed: See the new instructions.    dexamethasone 4 MG tablet Commonly known as: DECADRON Take 1 tablet (4 mg total) by mouth 2 (two) times daily with a meal.   docusate sodium 100 MG capsule Commonly known as: COLACE Take 1 capsule (100 mg total) by mouth daily.   doxycycline 100 MG tablet Commonly known as: VIBRA-TABS Take 1 tablet (100 mg total) by mouth 2 (two) times daily. Discontinue after 01/29/2022 doses.   feeding supplement Liqd Take 237 mLs by mouth 2 (two) times daily between meals.   hydrALAZINE 25 MG tablet Commonly known as: APRESOLINE Take 1 tablet (25 mg total) by mouth 3 (three) times daily.   insulin detemir 100 UNIT/ML injection Commonly known as: LEVEMIR Inject 0.08 mLs (8 Units total) into the skin at bedtime. Start taking on: January 23, 2022 What changed: when to take this   mometasone-formoterol 100-5 MCG/ACT Aero Commonly known as: DULERA Inhale 2 puffs into the lungs 2 (two) times daily.   oxyCODONE 5 MG immediate release tablet Commonly known as: Oxy IR/ROXICODONE Take 1 tablet (5 mg total) by mouth every 6 (six) hours as needed for moderate pain or severe pain.   OXYGEN Inhale 1.5-2 L into the lungs See admin instructions. Uses at bedtime and through the day as needed For shortness of breath   pantoprazole 40 MG tablet Commonly known as: PROTONIX Take 1 tablet (40 mg total) by mouth daily.   PATADAY OP Place 1 drop into both eyes daily as needed (burning).   polyethylene glycol 17 g packet Commonly known as: MIRALAX / GLYCOLAX Take 17 g by mouth daily as needed for mild constipation.   PREPARATION H RE Place 1 application. rectally daily as needed (hemorrhoids).   Pulse Oximeter For Finger Misc 1 Units by Does not apply route as needed.   rivaroxaban 20 MG Tabs tablet Commonly known as: Xarelto Take 1 tablet (20 mg total) by mouth daily with supper. What changed: See the new instructions.   spironolactone 25 MG tablet Commonly known as: ALDACTONE Take 1 tablet (25 mg  total) by mouth daily.   SYSTANE OP Place 1 drop into both  eyes daily as needed (dry eyes).   vitamin C 1000 MG tablet Take 1,000 mg by mouth every other day.       Allergies  Allergen Reactions   Lisinopril Swelling and Cough    Face/throat swelling   Tree Extract Swelling and Other (See Comments)    Swelling to eyes   Augmentin [Amoxicillin-Pot Clavulanate] Other (See Comments)    Headache, dizzy   Ciprofloxacin Hives      Procedures/Studies: DG Forearm Right  Result Date: 01/15/2022 CLINICAL DATA:  Status post right arm irrigation and debridement. Right arm is oozing blood. EXAM: RIGHT FOREARM - 2 VIEW COMPARISON:  CT right forearm 12/19/2021 FINDINGS: Normal bone mineralization. No cortical erosion to indicate radiographic evidence of acute osteomyelitis. There is moderate proximal volar forearm soft tissue swelling. No subcutaneous air is seen. Mild medial elbow joint space narrowing and peripheral osteophytosis. No acute fracture or dislocation. IMPRESSION: 1. Proximal volar forearm soft tissue swelling. No subcutaneous air. 2. No cortical erosion to indicate radiographic evidence of acute osteomyelitis. Electronically Signed   By: Yvonne Kendall M.D.   On: 01/15/2022 16:23     DG Chest Port 1 View  Result Date: 01/15/2022 CLINICAL DATA:  Sepsis. EXAM: PORTABLE CHEST 1 VIEW COMPARISON:  01/06/2022 FINDINGS: Cardiomegaly again noted. LEFT perihilar opacity with fiducial markers are unchanged. There is no evidence of airspace disease, pleural effusion, pneumothorax or acute bony abnormality. IMPRESSION: Cardiomegaly without evidence of acute cardiopulmonary disease. Electronically Signed   By: Margarette Canada M.D.   On: 01/15/2022 16:25   DG Femur Min 2 Views Right  Result Date: 01/15/2022 CLINICAL DATA:  Recent RIGHT above the knee amputation. Redness at the amputation site reported by family. EXAM: RIGHT FEMUR 2 VIEWS COMPARISON:  None Available. FINDINGS: Limited evaluation is  due to limited patient positioning and patient did not allow for further repositioning. RIGHT AKA changes are noted. No acute fracture, dislocation or acute osteomyelitis noted. IMPRESSION: 1. RIGHT AKA changes without acute bony abnormality or acute osteomyelitis. Electronically Signed   By: Margarette Canada M.D.   On: 01/15/2022 16:21      Subjective: Patient interviewed and examined this morning along with her female RN in the room.  Patient sleeping but arousable easily.  States that she did not get good night sleep.  Soiled and soft brown feces, nurses styling her up.  No specific complaints made.  As per RN, no acute issues noted.  Discharge Exam:  Vitals:   01/21/22 0336 01/21/22 1358 01/21/22 2045 01/22/22 0457  BP: (!) 112/98 (!) 142/91 (!) 158/100 (!) 162/78  Pulse: 89 77 69 96  Resp: 17 19 16 18   Temp: 98.5 F (36.9 C) 98.6 F (37 C) 99.1 F (37.3 C) 98.5 F (36.9 C)  TempSrc: Oral Oral Oral Oral  SpO2:  99% 95% 91%  Weight:      Height:        General exam: Middle-age female, moderately built and obese lying comfortably propped up in bed without distress.   Respiratory system: Clear to auscultation. Respiratory effort normal. Cardiovascular system: S1 & S2 heard, RRR. No JVD, murmurs, rubs, gallops or clicks. No pedal edema.  Telemetry personally reviewed: Sinus rhythm.  No further episodes of nonsustained SVT or VT. Gastrointestinal system: Abdomen is nondistended, soft and nontender. No organomegaly or masses felt. Normal bowel sounds heard. Central nervous system: Sleeping but arousable and oriented x2. No focal neurological deficits. Extremities: Symmetric 5 x 5 power. Skin: Right forearm dressing clean  and dry.  Removed RLE stump dressing.  Suture line well approximated with staples except approximately 1 cm area on the lateral aspect with mild dehiscence, no drainage, mild dusky suture line but no overt redness, tenderness or fluctuation. Psychiatry: Judgement and insight  appear impaired. Mood & affect appropriate.     The results of significant diagnostics from this hospitalization (including imaging, microbiology, ancillary and laboratory) are listed below for reference.     Microbiology: Recent Results (from the past 240 hour(s))  Blood Culture (routine x 2)     Status: None   Collection Time: 01/15/22  3:20 PM   Specimen: BLOOD  Result Value Ref Range Status   Specimen Description   Final    BLOOD BLOOD LEFT FOREARM Performed at [facility] [address] [place], [place] [address]    Special Requests   Final    BOTTLES DRAWN AEROBIC AND ANAEROBIC Blood Culture results may not be optimal due to an excessive volume of blood received in culture bottles Performed at [facility] [address], [place], [place] [address]    Culture   Final    NO GROWTH 5 DAYS Performed at [facility], [place] [address], [place],  [address]    Report Status 01/20/2022 FINAL  Final  Resp Panel by RT-PCR (Flu A&B, Covid) Anterior Nasal Swab     Status: None   Collection Time: 01/15/22  3:31 PM   Specimen: Anterior Nasal Swab  Result Value Ref Range Status   SARS Coronavirus 2 by RT PCR NEGATIVE NEGATIVE Final    Comment: (NOTE) SARS-CoV-2 target nucleic acids are NOT DETECTED.  The SARS-CoV-2 RNA is generally detectable in upper respiratory specimens during the acute phase of infection. The lowest concentration of SARS-CoV-2 viral copies this assay can detect is 138 copies/mL. A negative result does not preclude SARS-Cov-2 infection and should not be used as the sole basis for treatment or other patient management decisions. A negative result may occur with  improper specimen collection/handling, submission of specimen other than nasopharyngeal swab, presence of viral mutation(s) within the areas targeted by this assay, and inadequate number of viral copies(<138 copies/mL). A negative result must be combined  with clinical observations, patient history, and epidemiological information. The expected result is Negative.  Fact Sheet for Patients:  EntrepreneurPulse.com.au  Fact Sheet for Healthcare Providers:  IncredibleEmployment.be  This test is no t yet approved or cleared by the [place] FDA and  has been authorized for detection and/or diagnosis of SARS-CoV-2 by FDA under an Emergency Use Authorization (EUA). This EUA will remain  in effect (meaning this test can be used) for the duration of the COVID-19 declaration under Section 564(b)(1) of the Act, 21 U.S.C.section 360bbb-3(b)(1), unless the authorization is terminated  or revoked sooner.       Influenza A by PCR NEGATIVE NEGATIVE Final   Influenza B by PCR NEGATIVE NEGATIVE Final    Comment: (NOTE) The Xpert Xpress SARS-CoV-2/FLU/RSV plus assay is intended as an aid in the diagnosis of influenza from Nasopharyngeal swab specimens and should not be used as a sole basis for treatment. Nasal washings and aspirates are unacceptable for Xpert Xpress SARS-CoV-2/FLU/RSV testing.  Fact Sheet for Patients: EntrepreneurPulse.com.au  Fact Sheet for Healthcare Providers: IncredibleEmployment.be  This test is not yet approved or cleared by the [place] FDA and has been authorized for detection and/or diagnosis of SARS-CoV-2 by FDA under an Emergency Use Authorization (EUA). This EUA will remain  in effect (meaning this test can be used) for the duration of the COVID-19 declaration under Section 564(b)(1) of the Act, 21 U.S.C. section 360bbb-3(b)(1), unless the authorization is terminated or revoked.  Performed at [facility], [place] [address] [place], [place] [address]   Blood Culture (routine x 2)     Status: None   Collection Time: 01/16/22  6:01 AM   Specimen: BLOOD LEFT FOREARM  Result Value Ref Range Status   Specimen  Description   Final    BLOOD LEFT FOREARM Performed at [facility] [address], [place], [place] [address]    Special Requests   Final    BOTTLES DRAWN AEROBIC ONLY Blood Culture adequate volume Performed at [facility] [address] [place], [place] [address]    Culture   Final    NO GROWTH 5 DAYS Performed at [facility], [place] [address], [place], [place] [address]    Report Status 01/21/2022 FINAL  Final  Blood Culture (routine x 2)     Status: None   Collection Time: 01/16/22  9:34 AM   Specimen: BLOOD LEFT HAND  Result Value Ref Range Status   Specimen Description   Final    BLOOD LEFT HAND Performed at [facility] [address], [place], [place] [address]    Special Requests   Final    IN PEDIATRIC BOTTLE Blood Culture adequate volume Performed at [facility] [address], [place], [place] [address]    Culture   Final    NO GROWTH 5 DAYS Performed at [facility], [place] [address], [place],  [address]    Report Status 01/21/2022 FINAL  Final     Labs: CBC: Recent Labs  Lab 01/15/22 1531 01/16/22 0934 01/17/22 0539 01/19/22 0444 01/21/22 0535 01/22/22 0534  WBC 9.2 7.5 7.9 6.1 4.3 4.3  NEUTROABS 8.3*  --  6.6 4.9  --   --   HGB 10.1* 9.4* 9.7* 9.1* 8.7* 9.1*  HCT 32.9* 30.2* 30.9* 29.6* 26.9* 30.0*  MCV 103.1* 104.1* 101.0* 102.8* 103.1* 103.1*  PLT 106* 89* 79* 76* 77* 91*    Basic Metabolic Panel: Recent Labs  Lab 01/16/22 0934 01/17/22 0539 01/19/22 0444 01/20/22 0540 01/22/22 0534  NA 143 141 143 141 141  K 4.3 3.7 3.9 3.7 3.5  CL 106 105 111 109 108  CO2 34* 29 29 28 28   GLUCOSE 94 66* 83 89 85  BUN 41* 35* 28* 23 16  CREATININE 0.87 0.91 0.84 0.87 0.86  CALCIUM 9.3 9.4 9.3 9.1 9.5  MG 2.0 1.8  --  2.2 1.8    Liver Function Tests: Recent Labs  Lab 01/15/22 1531  AST 26  ALT 44  ALKPHOS 84  BILITOT 0.5  PROT 5.7*  ALBUMIN  2.9*    CBG: Recent Labs  Lab 01/21/22 1220 01/21/22 1646 01/21/22 2132 01/22/22 0809 01/22/22 1200  GLUCAP 115* 74 105* 81 128*     Urinalysis    Component Value Date/Time   COLORURINE YELLOW 01/15/2022 [place] 01/15/2022 1633   APPEARANCEUR Turbid (A) 08/19/2021 1540   LABSPEC 1.019 01/15/2022 1633   PHURINE 5.0 01/15/2022 1633   GLUCOSEU 50 (A) 01/15/2022 1633   HGBUR NEGATIVE 01/15/2022 1633   BILIRUBINUR NEGATIVE 01/15/2022 1633   BILIRUBINUR Negative 08/19/2021 [place] 01/15/2022 1633   PROTEINUR NEGATIVE 01/15/2022 1633   UROBILINOGEN 0.2 02/27/2021  [place] 01/15/2022 [place] 01/15/2022 1633      Time coordinating discharge: 45 minutes  SIGNED:  Vernell Leep, MD,  FACP, [facility], [facility], [facility] (Care Management Physician Certified). [facility]  To contact the attending provider between 7A-7P or the covering provider during after hours 7P-7A, please log into the web site [email or web address] and access using universal [place] password for that web site. If you do not have the password, please call the hospital operator.

## 2022-01-23 DIAGNOSIS — E559 Vitamin D deficiency, unspecified: Secondary | ICD-10-CM | POA: Diagnosis not present

## 2022-01-23 DIAGNOSIS — E118 Type 2 diabetes mellitus with unspecified complications: Secondary | ICD-10-CM | POA: Diagnosis not present

## 2022-01-23 DIAGNOSIS — D696 Thrombocytopenia, unspecified: Secondary | ICD-10-CM | POA: Diagnosis not present

## 2022-01-23 DIAGNOSIS — Z7401 Bed confinement status: Secondary | ICD-10-CM | POA: Diagnosis not present

## 2022-01-23 DIAGNOSIS — I5032 Chronic diastolic (congestive) heart failure: Secondary | ICD-10-CM | POA: Diagnosis not present

## 2022-01-23 DIAGNOSIS — Z86711 Personal history of pulmonary embolism: Secondary | ICD-10-CM | POA: Diagnosis not present

## 2022-01-23 DIAGNOSIS — M545 Low back pain, unspecified: Secondary | ICD-10-CM | POA: Diagnosis not present

## 2022-01-23 DIAGNOSIS — R600 Localized edema: Secondary | ICD-10-CM | POA: Diagnosis not present

## 2022-01-23 DIAGNOSIS — T8149XD Infection following a procedure, other surgical site, subsequent encounter: Secondary | ICD-10-CM | POA: Diagnosis not present

## 2022-01-23 DIAGNOSIS — J45909 Unspecified asthma, uncomplicated: Secondary | ICD-10-CM | POA: Diagnosis not present

## 2022-01-23 DIAGNOSIS — S5011XA Contusion of right forearm, initial encounter: Secondary | ICD-10-CM | POA: Diagnosis not present

## 2022-01-23 DIAGNOSIS — S51801A Unspecified open wound of right forearm, initial encounter: Secondary | ICD-10-CM | POA: Diagnosis not present

## 2022-01-23 DIAGNOSIS — C7951 Secondary malignant neoplasm of bone: Secondary | ICD-10-CM | POA: Diagnosis not present

## 2022-01-23 DIAGNOSIS — I4821 Permanent atrial fibrillation: Secondary | ICD-10-CM | POA: Diagnosis not present

## 2022-01-23 DIAGNOSIS — J449 Chronic obstructive pulmonary disease, unspecified: Secondary | ICD-10-CM | POA: Diagnosis not present

## 2022-01-23 DIAGNOSIS — M726 Necrotizing fasciitis: Secondary | ICD-10-CM | POA: Diagnosis not present

## 2022-01-23 DIAGNOSIS — J9611 Chronic respiratory failure with hypoxia: Secondary | ICD-10-CM | POA: Diagnosis not present

## 2022-01-23 DIAGNOSIS — Z89611 Acquired absence of right leg above knee: Secondary | ICD-10-CM | POA: Diagnosis not present

## 2022-01-23 DIAGNOSIS — Z743 Need for continuous supervision: Secondary | ICD-10-CM | POA: Diagnosis not present

## 2022-01-23 DIAGNOSIS — N1832 Chronic kidney disease, stage 3b: Secondary | ICD-10-CM | POA: Diagnosis not present

## 2022-01-23 DIAGNOSIS — T8142XD Infection following a procedure, deep incisional surgical site, subsequent encounter: Secondary | ICD-10-CM | POA: Diagnosis not present

## 2022-01-23 DIAGNOSIS — C3490 Malignant neoplasm of unspecified part of unspecified bronchus or lung: Secondary | ICD-10-CM | POA: Diagnosis not present

## 2022-01-23 DIAGNOSIS — R531 Weakness: Secondary | ICD-10-CM | POA: Diagnosis not present

## 2022-01-23 DIAGNOSIS — M6281 Muscle weakness (generalized): Secondary | ICD-10-CM | POA: Diagnosis not present

## 2022-01-23 DIAGNOSIS — I10 Essential (primary) hypertension: Secondary | ICD-10-CM | POA: Diagnosis not present

## 2022-01-23 DIAGNOSIS — I469 Cardiac arrest, cause unspecified: Secondary | ICD-10-CM | POA: Diagnosis not present

## 2022-01-23 DIAGNOSIS — G4700 Insomnia, unspecified: Secondary | ICD-10-CM | POA: Diagnosis not present

## 2022-01-23 DIAGNOSIS — E46 Unspecified protein-calorie malnutrition: Secondary | ICD-10-CM | POA: Diagnosis not present

## 2022-01-23 DIAGNOSIS — Z9189 Other specified personal risk factors, not elsewhere classified: Secondary | ICD-10-CM | POA: Diagnosis not present

## 2022-01-23 DIAGNOSIS — Z1159 Encounter for screening for other viral diseases: Secondary | ICD-10-CM | POA: Diagnosis not present

## 2022-01-23 DIAGNOSIS — K5900 Constipation, unspecified: Secondary | ICD-10-CM | POA: Diagnosis not present

## 2022-01-23 DIAGNOSIS — T8743 Infection of amputation stump, right lower extremity: Secondary | ICD-10-CM | POA: Diagnosis not present

## 2022-01-23 DIAGNOSIS — I499 Cardiac arrhythmia, unspecified: Secondary | ICD-10-CM | POA: Diagnosis not present

## 2022-01-23 DIAGNOSIS — G9341 Metabolic encephalopathy: Secondary | ICD-10-CM | POA: Diagnosis not present

## 2022-01-23 DIAGNOSIS — I4729 Other ventricular tachycardia: Secondary | ICD-10-CM | POA: Diagnosis not present

## 2022-01-23 DIAGNOSIS — R269 Unspecified abnormalities of gait and mobility: Secondary | ICD-10-CM | POA: Diagnosis not present

## 2022-01-23 DIAGNOSIS — R4020 Unspecified coma: Secondary | ICD-10-CM | POA: Diagnosis not present

## 2022-01-23 DIAGNOSIS — J441 Chronic obstructive pulmonary disease with (acute) exacerbation: Secondary | ICD-10-CM | POA: Diagnosis not present

## 2022-01-23 DIAGNOSIS — I639 Cerebral infarction, unspecified: Secondary | ICD-10-CM | POA: Diagnosis not present

## 2022-01-23 DIAGNOSIS — L0390 Cellulitis, unspecified: Secondary | ICD-10-CM | POA: Diagnosis not present

## 2022-01-23 DIAGNOSIS — L03113 Cellulitis of right upper limb: Secondary | ICD-10-CM | POA: Diagnosis not present

## 2022-01-23 DIAGNOSIS — G4733 Obstructive sleep apnea (adult) (pediatric): Secondary | ICD-10-CM | POA: Diagnosis not present

## 2022-01-23 DIAGNOSIS — R404 Transient alteration of awareness: Secondary | ICD-10-CM | POA: Diagnosis not present

## 2022-01-23 DIAGNOSIS — D539 Nutritional anemia, unspecified: Secondary | ICD-10-CM | POA: Diagnosis not present

## 2022-01-23 DIAGNOSIS — T8781 Dehiscence of amputation stump: Secondary | ICD-10-CM | POA: Diagnosis not present

## 2022-01-23 DIAGNOSIS — Z4781 Encounter for orthopedic aftercare following surgical amputation: Secondary | ICD-10-CM | POA: Diagnosis not present

## 2022-01-23 DIAGNOSIS — R911 Solitary pulmonary nodule: Secondary | ICD-10-CM | POA: Diagnosis not present

## 2022-01-23 LAB — GLUCOSE, CAPILLARY: Glucose-Capillary: 92 mg/dL (ref 70–99)

## 2022-01-23 NOTE — Progress Notes (Signed)
Physical Therapy Treatment Patient Details Name: Shannon [name] MRN: [id number] DOB: March 10, 1956 Today's Date: 01/23/2022   History of Present Illness patient is a 66 year old female who was recently at this hospital from 4/26 to 5/24 with d/c to SNF for continuation of skilled services. patient presented with incresed edema and discharge from RUE. patient was admitted with cellulitis of right arm.  PMH: CVA, fungemia, hypoxic respiratory failure, AKA on 5/5, right forearm infection with fasciotomy and biopsy, HIV, CKD, DM II, CHF, atrial flutter, GERD, morbid obesity,    PT Comments    Pt requesting transfer to recliner. Required min assist for bed mobility, and max assist +2 for physical assist for transfer with [facility], pt required three attempts to stand in [place]. During transfer, pt had a BM and was cleaned up but declined to stand again for pericare, RN notified. In recliner, pt completed BLE exercises, encouraged pt to complete outside of therapy session. Discharge plan remains appropriate. We will continue to follow acutely.    Recommendations for follow up therapy are one component of a multi-disciplinary discharge planning process, led by the attending physician.  Recommendations may be updated based on patient status, additional functional criteria and insurance authorization.  Follow Up Recommendations  Skilled nursing-short term rehab (<3 hours/day)     Assistance Recommended at Discharge Frequent or constant Supervision/Assistance  Patient can return home with the following Two people to help with walking and/or transfers;Assistance with cooking/housework;Assist for transportation;Help with stairs or ramp for entrance;Two people to help with bathing/dressing/bathroom;Direct supervision/assist for medications management   Equipment Recommendations  None recommended by PT    Recommendations for Other Services Rehab consult     Precautions / Restrictions Precautions Precautions:  Fall Precaution Comments: R AKA, Splint/Cast: none Restrictions Weight Bearing Restrictions: Yes RLE Weight Bearing: Non weight bearing Other Position/Activity Restrictions: Recent R AKA     Mobility  Bed Mobility Overal bed mobility: Needs Assistance Bed Mobility: Supine to Sit     Supine to sit: Min assist, HOB elevated     General bed mobility comments: Pt min assist to sit up at EOB for scooting hips via Bed pad    Transfers Overall transfer level: Needs assistance Equipment used: Ambulation equipment used (stedy) Transfers: Sit to/from Stand Sit to Stand: Max assist, +2 physical assistance, +2 safety/equipment, From elevated surface           General transfer comment: Pt required 3 attempts to complete sit to stand transfer via STEDY with MaxA+2 for physical lift assistance. During movement, pt had bowel movement and had to be cleaned up but refused to stand again, RN notified. Required MaxA+2 to slide up in the recliner via bedpad Transfer via Lift Equipment: Stedy  Ambulation/Gait               General Gait Details: currently non amb NEW AKA   Stairs             Wheelchair Mobility    Modified Rankin (Stroke Patients Only)       Balance Overall balance assessment: Needs assistance Sitting-balance support: No upper extremity supported Sitting balance-Leahy Scale: Fair Sitting balance - Comments: short periods only before leaning onto L elbow   Standing balance support: Bilateral upper extremity supported, During functional activity, Reliant on assistive device for balance Standing balance-Leahy Scale: Zero Standing balance comment: Pt unable to stand without Max Assist  Cognition Arousal/Alertness: Awake/alert Behavior During Therapy: Restless, Impulsive Overall Cognitive Status: No family/caregiver present to determine baseline cognitive functioning Area of Impairment: Memory, Problem solving,  Safety/judgement                 Orientation Level: Disoriented to, Time, Situation Current Attention Level: Sustained Memory: Decreased short-term memory Following Commands: Follows one step commands consistently Safety/Judgement: Decreased awareness of safety, Decreased awareness of deficits Awareness: Emergent Problem Solving: Requires verbal cues, Difficulty sequencing, Decreased initiation, Slow processing General Comments: easily distracted        Exercises Other Exercises Other Exercises: Ankle pumps LLE x20 Other Exercises: Hip ABD/adduction b/l x10 Other Exercises: heel slides x10 LLE    General Comments        Pertinent Vitals/Pain Pain Assessment Pain Assessment: No/denies pain    Home Living                          Prior Function            PT Goals (current goals can now be found in the care plan section) Acute Rehab PT Goals Patient Stated Goal: get better PT Goal Formulation: With patient Time For Goal Achievement: 02/01/22 Potential to Achieve Goals: Fair Progress towards PT goals: Progressing toward goals    Frequency    Min 2X/week      PT Plan Current plan remains appropriate    Co-evaluation              AM-PAC PT "6 Clicks" Mobility   Outcome Measure  Help needed turning from your back to your side while in a flat bed without using bedrails?: A Little Help needed moving from lying on your back to sitting on the side of a flat bed without using bedrails?: Total Help needed moving to and from a bed to a chair (including a wheelchair)?: Total Help needed standing up from a chair using your arms (e.g., wheelchair or bedside chair)?: Total Help needed to walk in hospital room?: Total Help needed climbing 3-5 steps with a railing? : Total 6 Click Score: 8    End of Session Equipment Utilized During Treatment: Oxygen (2L via [place], 88-98%) Activity Tolerance: Patient limited by fatigue Patient left: with call  bell/phone within reach;in chair;with chair alarm set Nurse Communication: Mobility status PT Visit Diagnosis: Unsteadiness on feet (R26.81);Other abnormalities of gait and mobility (R26.89);Muscle weakness (generalized) (M62.81);Other symptoms and signs involving the nervous system (R29.898);Pain Pain - Right/Left: Right Pain - part of body: Leg (and arm)     Time: 4497-5300 PT Time Calculation (min) (ACUTE ONLY): 30 min  Charges:  $Therapeutic Exercise: 8-22 mins $Therapeutic Activity: 8-22 mins                    Coolidge Breeze, PT, DPT [facility] Department Office: [phone] Pager: [phone]  Coolidge Breeze 01/23/2022, 12:20 PM

## 2022-01-23 NOTE — Discharge Summary (Signed)
Physician Discharge Summary  Shannon [name] GYI:[id number] DOB: [birth year]  PCP: [name], MD  Admitted from: SNF (after a very brief stay there following recent discharge from the hospital.) Discharged to: SNF  Admit date: 01/15/2022 Discharge date: 01/23/2022  Recommendations for Outpatient Follow-up:    Follow-up Information     Newt Minion, MD Follow up in 1 week(s).   Specialty: Orthopedic Surgery Contact information: [place] [address] [phone]         MD at SNF Follow up.   Why: To be seen in 2 to 3 days with repeat labs (CBC & BMP).        [name], MD. Schedule an appointment as soon as possible for a visit.   Specialty: Internal Medicine Why: To be seen upon discharge from SNF. Contact information: [place] [address] [phone]         Skeet Latch, MD .   Specialty: Cardiology Contact information: [address] [place] [address] [place] [address] [place]: None    Equipment/Devices: TBD at [facility].    Discharge Condition: Improved and stable.   Code Status: Full Code Diet recommendation:  Discharge Diet Orders (From admission, onward)     Start     Ordered   01/22/22 0000  Diet - low sodium heart healthy        01/22/22 1310   01/22/22 0000  Diet Carb Modified        01/22/22 1310             Discharge Diagnoses:  Principal Problem:   Cellulitis of right arm Active Problems:   HIV disease ([place])   Uncontrolled hypertension   GERD (gastroesophageal reflux disease)   Chronic kidney disease (CKD), stage III (moderate) (HCC)   CAD (coronary artery disease)   Atrial flutter (HCC)   (HFpEF) heart failure with preserved ejection fraction (HCC)   Malignant neoplasm metastatic to bone ([place])   Cellulitis   Surgical site infection   H/O necrotizing fasciitis   Brief Summary: 66 year old female with medical history significant for  recurrent and metastatic/stage IV adenocarcinoma of the lung, controlled HIV disease, COPD, chronic diastolic CHF, CVA, recent prolonged hospitalization from 12/17/2021 - 01/14/2022 for necrotizing fasciitis of right foot eventually requiring AKA on 12/26/21 by Dr. Sharol Given, right forearm infection requiring fasciotomy and biopsy by hand surgery, course complicated by subacute CVA, fungemia, acute hypoxic respiratory failure, she completed course of antibiotics in the hospital, wound VAC was removed prior to discharge, discharged to SNF for less than 24 hours and readmitted for worsening of right upper extremity swelling with some serosanguineous discharge and swelling.  Evaluated in ED by orthopedics/hand surgery, did not see any role for surgical intervention, empirically started on broad-spectrum IV antibiotics for suspected RUE cellulitis.  ID and orthopedics/Dr. Sharol Given consulted.    Patient was medically optimized for DC and her DC paperwork were completed on 01/22/2022 but due to insurance authorization delay and family not wanting patient to return to SNF in the late hours of the day, discharge was held until this morning.  No acute changes over the last 24 hours.     Assessment & Plan:   Right upper extremity cellulitis with concerns of serosanguineous/purulent discharge on admission - Blood cultures x3 from 5/25 and 5/26: Negative. -- Started on broad-spectrum antibiotics-IV vancomycin, cefepime and Flagyl on  5/25 to 5/29 and then switched over to oral antibiotics as noted below. --  Orthopedic/hand surgery, Dr. Greta Doom consulted and saw pt in ER-he did not think there were any signs of infection based on his exam, no signs of deep abscess or compartment syndrome or development of new abscess within the right upper extremity.  He recommended daily dressing changes. --  Xarelto initially held for possible surgery, but was restarted - As noted, Recently underwent fascia biopsy and fasciotomy which showed  fibroadipose tissue. - Infectious disease was consulted.  Please refer to their detailed consultation notes on 5/30 and 5/31.  IV antibiotics above were switched to Augmentin and doxycycline primarily to cover for RLE stump infection but treated for RUE cellulitis as well.  RUE cellulitis has resolved.  Patient has completed antibiotic course for RUE cellulitis.  Continue dressing changes and close monitoring.   Right foot abscess with necrotizing fasciitis, status post AKA 12/26/2021 -- Had completed antibiotic course for this prior to most recent hospital discharge.   -- Wound VAC was removed and was supposed to follow-up with Dr. Sharol Given in 1 week but was re hospitalized. Requested Dr. Sharol Given to follow up 5/30 -Dr. Sharol Given follow-up 5/30 appreciated, noted wound with slight dehiscence with some redness around the edges and clear serosanguineous drainage, recommends continuing daily dry dressing changes and oral doxycycline. - Infectious disease was consulted, transitioned patient to Augmentin + doxycycline to complete a total 10-day course on 6/8, patient was monitored closely for additional couple days in the hospital, CRP was checked and is trending down, outpatient follow-up with orthopedics.  RLE stump site infection is improving.  ID has cleared for discharge to SNF. Patient has an ID follow-up appointment with [name] on 03/02/2022 at 109 AM.   Thrombocytopenia Unclear etiology.  Had it prior to 5/9 and then had resolved and platelet counts had rebounded to normal.  Again down since 5/22.  Stable in the 70s since 5/27.  No overt bleeding.  Follow CBC in a.m.  Consider further evaluation if has worsening.?  Related to antibiotics.  Platelet counts up to 91 on day of discharge.  Continue to trend at least twice a week at SNF to ensure it is heading in the right direction.  Could consider further evaluation if not improving or worsening.   Chronic macrocytic anemia Hemoglobin essentially stable in the  9-10 g range for last several days.  Periodically follow CBCs.   Recent CVA of right anterior temporal lobe - Was on Xarelto outpatient.  Neurology recommended repeat MRI brain on 02/01/2022.  This to be arranged as outpatient.   HIV disease - CD4 count 68.  On Biktarvy.  As per ID follow-up, well controlled. -Follows with [name], [place].  Has upcoming follow-up on 03/02/22 at 11 AM.   Diabetes mellitus type 2, insulin-dependent - A1c 7.3 on 12/17/2021.  Continue close CBG monitoring, prior dose of Levemir.  Patient has really not received much of any SSI and hence will not be discharged on this.  Upon recent discharge, patient was on Decadron for bony mets and related pain but for some reason this was not continued on admission.  Checked with pharmacist and no reason why this was not continued.  Will resume at time of discharge.  Outpatient follow-up with oncology regarding management of steroids.  If CBGs consistently greater than 150 or 180, could consider starting SSI.     CKD stage II - Stable.   Essential hypertension - Continue carvedilol and  hydralazine.   Congestive heart failure with preserved ejection fraction - Clinically euvolemic.  Continue carvedilol. Aldactone was on hold in the hospital and will be resumed at discharge.   History of atrial flutter Continue carvedilol and Xarelto.   Nonsustained SVT/VT Echo in March 2023 showed normal EF. Replace potassium and magnesium to >4 and >2 respectively.  Replaced even on day of discharge. Continue carvedilol.   GERD - PPI   Morbid obesity with BMI greater than 35.  BMI however now appears to be down at 34.48. - Recently seen by dietitian   COPD/recent acute respiratory failure with hypoxia and hypercapnia/probable OSA -as needed bronchodilators.  Probably also has OSA, which can be evaluated and managed as outpatient as deemed necessary.  Could trial of CPAP at bedtime at SNF if willing to use.  During recent admission,  had failed BiPAP and required intubation.  Aggressive pulmonary toilet.  Avoid oversedation.   Recent fungemia - Completed Diflucan on 5/15.  Seen by ID and ophthalmology.  Recent acute metabolic encephalopathy: RPR then was nonreactive, vitamin B-12 normal, MRI brain on 5/13 that suggested CVA but was motion degraded and neurology recommended repeating MRI in 4 weeks.  As per sister, patient does have sundowning.  Question some degree of underlying cognitive impairment/dementia.  Could consider outpatient neuropsychiatric evaluation.  Adenocarcinoma of lung, recurrent metastatic non-small cell lung cancer: S/p curative radiotherapy to left upper lobe lung nodules 8/21 Noted to have recurrence as of February 2023 >metastatic disease involving several areas of the bones including the ribs as well as thoracic spine, lumbar spine and sacrum.  She also had numerous small solid bilateral pulmonary nodules. Underwent CT-guided core biopsy of the metastatic lesion of the posterior right ninth rib and the final pathology was consistent with adenocarcinoma of the lung Follows with Dr. Julien Nordmann On Decadron as per oncology for bony mets and on PPI for GI protection.     Body mass index is 34.48 kg/m./Obesity       Consultants:   ID Orthopedics   Procedures:   None   Discharge Instructions  Discharge Instructions     Call MD for:  difficulty breathing, headache or visual disturbances   Complete by: As directed    Call MD for:  extreme fatigue   Complete by: As directed    Call MD for:  persistant dizziness or light-headedness   Complete by: As directed    Call MD for:  persistant nausea and vomiting   Complete by: As directed    Call MD for:  redness, tenderness, or signs of infection (pain, swelling, redness, odor or green/yellow discharge around incision site)   Complete by: As directed    Call MD for:  severe uncontrolled pain   Complete by: As directed    Call MD for:  temperature  >100.4   Complete by: As directed    Diet - low sodium heart healthy   Complete by: As directed    Diet Carb Modified   Complete by: As directed    Discharge wound care:   Complete by: As directed    1) Place ABD pads over the RLE amputation site, secure with tape. Change every 2 days and prn. 2) R forearm: Wound care  Every shift. Clean wound and apply xeroform, gauze/abd, and loosely cover with Kerlix. Change dressing. May perform more often if drainage increases.   Increase activity slowly   Complete by: As directed         Medication List  STOP taking these medications    morphine 15 MG 12 hr tablet Commonly known as: MS CONTIN   neomycin-bacitracin-polymyxin 400-12-4998 ointment Commonly known as: NEOSPORIN   oxyCODONE-acetaminophen 5-325 MG tablet Commonly known as: Percocet       TAKE these medications    A&D Oint Apply 1 application. topically daily as needed (rash).   acetaminophen 325 MG tablet Commonly known as: TYLENOL Take 2 tablets (650 mg total) by mouth every 6 (six) hours as needed for mild pain, fever or headache.   albuterol (2.5 MG/3ML) 0.083% nebulizer solution Commonly known as: PROVENTIL USE 1 VIAL VIA NEBULIZER EVERY 4 HOURS AS NEEDED FOR WHEEZING OR SHORTNESS OF BREATH What changed: See the new instructions.   amoxicillin-clavulanate 875-125 MG tablet Commonly known as: AUGMENTIN Take 1 tablet by mouth 2 (two) times daily. Discontinue after 01/29/2022 doses.   b complex vitamins tablet Take 1 tablet by mouth daily.   B-D SYRINGE/NEEDLE 1CC/25GX5/8 25G X 5/8" 1 ML Misc Generic drug: SYRINGE/NEEDLE (DISP) 1 ML 1 Units by Does not apply route every 30 (thirty) days.   Biktarvy 50-200-25 MG Tabs tablet Generic drug: bictegravir-emtricitabine-tenofovir AF Take 1 tablet by mouth daily.   CALCIUM + D PO Take 1 tablet by mouth every other day.   carvedilol 12.5 MG tablet Commonly known as: COREG Take 1 tablet (12.5 mg total) by mouth  2 (two) times daily with a meal.   CLEAR EYES OP Place 1 drop into both eyes daily as needed (redness).   cyanocobalamin 1000 MCG/ML injection Commonly known as: (VITAMIN B-12) ADMINISTER 1 ML(1000 MCG) IN THE MUSCLE EVERY 30 DAYS What changed: See the new instructions.   dexamethasone 4 MG tablet Commonly known as: DECADRON Take 1 tablet (4 mg total) by mouth 2 (two) times daily with a meal.   docusate sodium 100 MG capsule Commonly known as: COLACE Take 1 capsule (100 mg total) by mouth daily.   doxycycline 100 MG tablet Commonly known as: VIBRA-TABS Take 1 tablet (100 mg total) by mouth 2 (two) times daily. Discontinue after 01/29/2022 doses.   feeding supplement Liqd Take 237 mLs by mouth 2 (two) times daily between meals.   hydrALAZINE 25 MG tablet Commonly known as: APRESOLINE Take 1 tablet (25 mg total) by mouth 3 (three) times daily.   insulin detemir 100 UNIT/ML injection Commonly known as: LEVEMIR Inject 0.08 mLs (8 Units total) into the skin at bedtime. What changed: when to take this   mometasone-formoterol 100-5 MCG/ACT Aero Commonly known as: DULERA Inhale 2 puffs into the lungs 2 (two) times daily.   oxyCODONE 5 MG immediate release tablet Commonly known as: Oxy IR/ROXICODONE Take 1 tablet (5 mg total) by mouth every 6 (six) hours as needed for moderate pain or severe pain.   OXYGEN Inhale 1.5-2 L into the lungs See admin instructions. Uses at bedtime and through the day as needed For shortness of breath   pantoprazole 40 MG tablet Commonly known as: PROTONIX Take 1 tablet (40 mg total) by mouth daily.   PATADAY OP Place 1 drop into both eyes daily as needed (burning).   polyethylene glycol 17 g packet Commonly known as: MIRALAX / GLYCOLAX Take 17 g by mouth daily as needed for mild constipation.   PREPARATION H RE Place 1 application. rectally daily as needed (hemorrhoids).   Pulse Oximeter For Finger Misc 1 Units by Does not apply route as  needed.   rivaroxaban 20 MG Tabs tablet Commonly known as: Xarelto Take 1  tablet (20 mg total) by mouth daily with supper. What changed: See the new instructions.   spironolactone 25 MG tablet Commonly known as: ALDACTONE Take 1 tablet (25 mg total) by mouth daily.   SYSTANE OP Place 1 drop into both eyes daily as needed (dry eyes).   vitamin C 1000 MG tablet Take 1,000 mg by mouth every other day.       Allergies  Allergen Reactions   Lisinopril Swelling and Cough    Face/throat swelling   Tree Extract Swelling and Other (See Comments)    Swelling to eyes   Augmentin [Amoxicillin-Pot Clavulanate] Other (See Comments)    Headache, dizzy   Ciprofloxacin Hives      Procedures/Studies: DG Forearm Right  Result Date: 01/15/2022 CLINICAL DATA:  Status post right arm irrigation and debridement. Right arm is oozing blood. EXAM: RIGHT FOREARM - 2 VIEW COMPARISON:  CT right forearm 12/19/2021 FINDINGS: Normal bone mineralization. No cortical erosion to indicate radiographic evidence of acute osteomyelitis. There is moderate proximal volar forearm soft tissue swelling. No subcutaneous air is seen. Mild medial elbow joint space narrowing and peripheral osteophytosis. No acute fracture or dislocation. IMPRESSION: 1. Proximal volar forearm soft tissue swelling. No subcutaneous air. 2. No cortical erosion to indicate radiographic evidence of acute osteomyelitis. Electronically Signed   By: Yvonne Kendall M.D.   On: 01/15/2022 16:23     DG Chest Port 1 View  Result Date: 01/15/2022 CLINICAL DATA:  Sepsis. EXAM: PORTABLE CHEST 1 VIEW COMPARISON:  01/06/2022 FINDINGS: Cardiomegaly again noted. LEFT perihilar opacity with fiducial markers are unchanged. There is no evidence of airspace disease, pleural effusion, pneumothorax or acute bony abnormality. IMPRESSION: Cardiomegaly without evidence of acute cardiopulmonary disease. Electronically Signed   By: Margarette Canada M.D.   On: 01/15/2022 16:25    DG Femur Min 2 Views Right  Result Date: 01/15/2022 CLINICAL DATA:  Recent RIGHT above the knee amputation. Redness at the amputation site reported by family. EXAM: RIGHT FEMUR 2 VIEWS COMPARISON:  None Available. FINDINGS: Limited evaluation is due to limited patient positioning and patient did not allow for further repositioning. RIGHT AKA changes are noted. No acute fracture, dislocation or acute osteomyelitis noted. IMPRESSION: 1. RIGHT AKA changes without acute bony abnormality or acute osteomyelitis. Electronically Signed   By: Margarette Canada M.D.   On: 01/15/2022 16:21      Subjective: Patient sleeping but easily arousable.  Denies complaints.  As per discussion with RN, no acute issues over the last 24 hours.  Patient refused to take some of her morning meds.  Patient denies pain or any other complaints.  She is aware that she will be going to SNF shortly today.  Discharge Exam:  Vitals:   01/22/22 2053 01/22/22 2324 01/23/22 0554 01/23/22 0807  BP: 129/79  (!) 168/93   Pulse: 85  85   Resp: (!) 22  16   Temp: 99 F (37.2 C)  98.6 F (37 C)   TempSrc: Oral  Oral   SpO2: 99% 98% 99% 98%  Weight:      Height:        General exam: Middle-age female, moderately built and obese lying comfortably propped up in bed without distress.   Respiratory system: Clear to auscultation. Respiratory effort normal. Cardiovascular system: S1 & S2 heard, RRR. No JVD, murmurs, rubs, gallops or clicks. No pedal edema.  Telemetry personally reviewed: Sinus rhythm.  No further episodes of nonsustained SVT or VT. Gastrointestinal system: Abdomen is nondistended, soft  and nontender. No organomegaly or masses felt. Normal bowel sounds heard. Central nervous system: Sleeping but arousable and oriented x2. No focal neurological deficits. Extremities: Symmetric 5 x 5 power. Skin: Right forearm dressing clean and dry.  As on 6/1: Removed RLE stump dressing.  Suture line well approximated with staples except  approximately 1 cm area on the lateral aspect with mild dehiscence, no drainage, mild dusky suture line but no overt redness, tenderness or fluctuation.  Is on 6/2: As per RN who changed the dressing, no change in site compared to yesterday.  Dressing clean and dry during my visit. Psychiatry: Judgement and insight appear impaired. Mood & affect appropriate.     The results of significant diagnostics from this hospitalization (including imaging, microbiology, ancillary and laboratory) are listed below for reference.     Microbiology: Recent Results (from the past 240 hour(s))  Blood Culture (routine x 2)     Status: None   Collection Time: 01/15/22  3:20 PM   Specimen: BLOOD  Result Value Ref Range Status   Specimen Description   Final    BLOOD BLOOD LEFT FOREARM Performed at [facility] [address], [place], [place] [address]    Special Requests   Final    BOTTLES DRAWN AEROBIC AND ANAEROBIC Blood Culture results may not be optimal due to an excessive volume of blood received in culture bottles Performed at [facility] [address] [place], [place] [address]    Culture   Final    NO GROWTH 5 DAYS Performed at [facility], [place] [address], [place],  [address]    Report Status 01/20/2022 FINAL  Final  Resp Panel by RT-PCR (Flu A&B, Covid) Anterior Nasal Swab     Status: None   Collection Time: 01/15/22  3:31 PM   Specimen: Anterior Nasal Swab  Result Value Ref Range Status   SARS Coronavirus 2 by RT PCR NEGATIVE NEGATIVE Final    Comment: (NOTE) SARS-CoV-2 target nucleic acids are NOT DETECTED.  The SARS-CoV-2 RNA is generally detectable in upper respiratory specimens during the acute phase of infection. The lowest concentration of SARS-CoV-2 viral copies this assay can detect is 138 copies/mL. A negative result does not preclude SARS-Cov-2 infection and should not be used as the sole basis for treatment or other  patient management decisions. A negative result may occur with  improper specimen collection/handling, submission of specimen other than nasopharyngeal swab, presence of viral mutation(s) within the areas targeted by this assay, and inadequate number of viral copies(<138 copies/mL). A negative result must be combined with clinical observations, patient history, and epidemiological information. The expected result is Negative.  Fact Sheet for Patients:  EntrepreneurPulse.com.au  Fact Sheet for Healthcare Providers:  IncredibleEmployment.be  This test is no t yet approved or cleared by the [place] FDA and  has been authorized for detection and/or diagnosis of SARS-CoV-2 by FDA under an Emergency Use Authorization (EUA). This EUA will remain  in effect (meaning this test can be used) for the duration of the COVID-19 declaration under Section 564(b)(1) of the Act, 21 U.S.C.section 360bbb-3(b)(1), unless the authorization is terminated  or revoked sooner.       Influenza A by PCR NEGATIVE NEGATIVE Final   Influenza B by PCR NEGATIVE NEGATIVE Final    Comment: (NOTE) The Xpert Xpress SARS-CoV-2/FLU/RSV plus assay is intended as an aid in the diagnosis of influenza from Nasopharyngeal swab specimens and should not be used as a  sole basis for treatment. Nasal washings and aspirates are unacceptable for Xpert Xpress SARS-CoV-2/FLU/RSV testing.  Fact Sheet for Patients: EntrepreneurPulse.com.au  Fact Sheet for Healthcare Providers: IncredibleEmployment.be  This test is not yet approved or cleared by the [place] FDA and has been authorized for detection and/or diagnosis of SARS-CoV-2 by FDA under an Emergency Use Authorization (EUA). This EUA will remain in effect (meaning this test can be used) for the duration of the COVID-19 declaration under Section 564(b)(1) of the Act, 21 U.S.C. section  360bbb-3(b)(1), unless the authorization is terminated or revoked.  Performed at [facility], [place] [address], [place], [place] [address]   Blood Culture (routine x 2)     Status: None   Collection Time: 01/16/22  6:01 AM   Specimen: BLOOD LEFT FOREARM  Result Value Ref Range Status   Specimen Description   Final    BLOOD LEFT FOREARM Performed at [facility] [address] [place], [place] [address]    Special Requests   Final    BOTTLES DRAWN AEROBIC ONLY Blood Culture adequate volume Performed at [facility] [address], [place], [place] [address]    Culture   Final    NO GROWTH 5 DAYS Performed at [facility], [place] [address] [place], [place] [address]    Report Status 01/21/2022 FINAL  Final  Blood Culture (routine x 2)     Status: None   Collection Time: 01/16/22  9:34 AM   Specimen: BLOOD LEFT HAND  Result Value Ref Range Status   Specimen Description   Final    BLOOD LEFT HAND Performed at [facility] [address], [place], [place] [address]    Special Requests   Final    IN PEDIATRIC BOTTLE Blood Culture adequate volume Performed at [facility] [address] [place], [place] [address]    Culture   Final    NO GROWTH 5 DAYS Performed at [facility], [place] [address] [place], [place] [address]    Report Status 01/21/2022 FINAL  Final     Labs: CBC: Recent Labs  Lab 01/17/22 0539 01/19/22 0444 01/21/22 0535 01/22/22 0534  WBC 7.9 6.1 4.3 4.3  NEUTROABS 6.6 4.9  --   --   HGB 9.7* 9.1* 8.7* 9.1*  HCT 30.9* 29.6* 26.9* 30.0*  MCV 101.0* 102.8* 103.1* 103.1*  PLT 79* 76* 77* 91*    Basic Metabolic Panel: Recent Labs  Lab 01/17/22 0539 01/19/22 0444 01/20/22 0540 01/22/22 0534  NA 141 143 141 141  K 3.7 3.9 3.7 3.5  CL 105 111 109 108  CO2 29 29 28 28   GLUCOSE 66* 83 89 85  BUN 35* 28* 23 16  CREATININE 0.91 0.84 0.87 0.86   CALCIUM 9.4 9.3 9.1 9.5  MG 1.8  --  2.2 1.8    Liver Function Tests: No results for input(s): AST, ALT, ALKPHOS, BILITOT, PROT, ALBUMIN in the last 168 hours.   CBG: Recent Labs  Lab 01/22/22 0809 01/22/22 1200 01/22/22 1653 01/22/22 2056 01/23/22 0716  GLUCAP 81 128* 119* 129* 92     Urinalysis    Component Value Date/Time   COLORURINE YELLOW 01/15/2022 1633   APPEARANCEUR CLEAR 01/15/2022 1633   APPEARANCEUR Turbid (A) 08/19/2021 1540   LABSPEC 1.019 01/15/2022 1633   PHURINE 5.0 01/15/2022 1633   GLUCOSEU 50 (A) 01/15/2022 1633   HGBUR NEGATIVE 01/15/2022 1633   BILIRUBINUR NEGATIVE  01/15/2022 1633   BILIRUBINUR Negative 08/19/2021 1540   [place] 01/15/2022 1633   PROTEINUR NEGATIVE 01/15/2022 1633   UROBILINOGEN 0.2 02/27/2021 1533   NITRITE NEGATIVE 01/15/2022 1633   LEUKOCYTESUR NEGATIVE 01/15/2022 1633      Time coordinating discharge: 45 minutes  SIGNED:  Vernell Leep, MD,  FACP, [facility], [facility], [facility] (Care Management Physician Certified). [facility]  To contact the attending provider between 7A-7P or the covering provider during after hours 7P-7A, please log into the web site [email or web address] and access using universal [place] password for that web site. If you do not have the password, please call the hospital operator.

## 2022-01-23 NOTE — Progress Notes (Signed)
Progress Note  Patient was medically optimized for DC and her DC paperwork were completed on 01/22/2022 but due to insurance authorization delay and family not wanting patient to return to SNF in the late hours of the day, discharge was held until this morning.  No acute changes over the last 24 hours.  Updated discharge summary from yesterday with today's interview, exam.  Discussed with TOC, appropriate to discharge today.  Vernell Leep, MD,  FACP, [facility], [facility], [facility] (Care Management Physician Certified) [facility]  To contact the attending provider between 7A-7P or the covering provider during after hours 7P-7A, please log into the web site [email or web address] and access using universal [place] password for that web site. If you do not have the password, please call the hospital operator.

## 2022-01-23 NOTE — TOC Transition Note (Signed)
Transition of Care [facility]) - CM/SW Discharge Note   Patient Details  Name: Shannon [name] MRN: [id number] Date of Birth: 09-15-1955  Transition of [facility]) CM/SW Contact:  Vassie Moselle, [place] Phone Number: 01/23/2022, 10:01 AM   Clinical Narrative:    Pt is to transfer to [facility] for SNF placement today. Insurance authorization [id number]) has been received and provided to [facility]. She will be going to room 119B. Call to report number is [phone]. PTAR called at 1030.     Final next level of care: Skilled Nursing Facility Barriers to Discharge: Barriers Resolved   Patient Goals and CMS Choice Patient states their goals for this hospitalization and ongoing recovery are:: To go to SNF and then return home   Choice offered to / list presented to : Sibling  Discharge Placement PASRR number recieved: 12/24/21 Existing PASRR number confirmed : 01/16/22          Patient chooses bed at: [facility] Patient to be transferred to facility by: [place] Name of family member notified: Sister, Sharyn Lull Patient and family notified of of transfer: 01/23/22  Discharge Plan and Services     Post Acute Care Choice: [place]          DME Arranged: N/A                    Social Determinants of Health (SDOH) Interventions     Readmission Risk Interventions    01/23/2022    9:53 AM 12/22/2021    9:44 AM  Readmission Risk Prevention Plan  Transportation Screening Complete Complete  Medication Review ([place]) Complete Complete  PCP or Specialist appointment within 3-5 days of discharge Complete   HRI or Home Care Consult Complete Complete  SW Recovery Care/Counseling Consult Complete Complete  Palliative Care Screening Not Applicable Not Applicable  Skilled Nursing Facility Complete Complete

## 2022-01-26 DIAGNOSIS — M545 Low back pain, unspecified: Secondary | ICD-10-CM | POA: Diagnosis not present

## 2022-01-26 DIAGNOSIS — M6281 Muscle weakness (generalized): Secondary | ICD-10-CM | POA: Diagnosis not present

## 2022-01-26 DIAGNOSIS — E118 Type 2 diabetes mellitus with unspecified complications: Secondary | ICD-10-CM | POA: Diagnosis not present

## 2022-01-26 DIAGNOSIS — Z86711 Personal history of pulmonary embolism: Secondary | ICD-10-CM | POA: Diagnosis not present

## 2022-01-26 DIAGNOSIS — C3490 Malignant neoplasm of unspecified part of unspecified bronchus or lung: Secondary | ICD-10-CM | POA: Diagnosis not present

## 2022-01-26 DIAGNOSIS — M726 Necrotizing fasciitis: Secondary | ICD-10-CM | POA: Diagnosis not present

## 2022-01-26 DIAGNOSIS — I10 Essential (primary) hypertension: Secondary | ICD-10-CM | POA: Diagnosis not present

## 2022-01-26 DIAGNOSIS — Z89611 Acquired absence of right leg above knee: Secondary | ICD-10-CM | POA: Diagnosis not present

## 2022-01-27 ENCOUNTER — Ambulatory Visit (INDEPENDENT_AMBULATORY_CARE_PROVIDER_SITE_OTHER): Payer: Medicare Other | Admitting: Family

## 2022-01-27 ENCOUNTER — Encounter: Payer: Self-pay | Admitting: Family

## 2022-01-27 DIAGNOSIS — J449 Chronic obstructive pulmonary disease, unspecified: Secondary | ICD-10-CM | POA: Diagnosis not present

## 2022-01-27 DIAGNOSIS — S78111A Complete traumatic amputation at level between right hip and knee, initial encounter: Secondary | ICD-10-CM

## 2022-01-27 DIAGNOSIS — I4821 Permanent atrial fibrillation: Secondary | ICD-10-CM | POA: Diagnosis not present

## 2022-01-27 DIAGNOSIS — T8743 Infection of amputation stump, right lower extremity: Secondary | ICD-10-CM | POA: Diagnosis not present

## 2022-01-27 DIAGNOSIS — K5900 Constipation, unspecified: Secondary | ICD-10-CM | POA: Diagnosis not present

## 2022-01-27 DIAGNOSIS — Z89611 Acquired absence of right leg above knee: Secondary | ICD-10-CM

## 2022-01-27 DIAGNOSIS — Z9189 Other specified personal risk factors, not elsewhere classified: Secondary | ICD-10-CM | POA: Diagnosis not present

## 2022-01-27 DIAGNOSIS — T8142XD Infection following a procedure, deep incisional surgical site, subsequent encounter: Secondary | ICD-10-CM | POA: Diagnosis not present

## 2022-01-27 DIAGNOSIS — L03113 Cellulitis of right upper limb: Secondary | ICD-10-CM | POA: Diagnosis not present

## 2022-01-27 NOTE — Progress Notes (Signed)
Post-Op Visit Note   Patient: Shannon [name]           Date of Birth: 05-22-1956           MRN: [id number] Visit Date: 01/27/2022 PCP: [name], MD  Chief Complaint:  Chief Complaint  Patient presents with   Right Leg - Routine Post Op    Hx right AKA    HPI:  HPI The patient is a 66 year old woman seen status post right above-knee amputation on May 5.  Her sutures and staples are still in place she is complaining of phantom pain today.  She is residing at skilled nursing. Ortho Exam On examination of the right above-knee amputation this is healing well sutures and staples are in place there is scant serosanguineous drainage centrally. There is no dehiscence.  Visit Diagnoses: No diagnosis found.  Plan: Attempted to harvest sutures and staples.  Patient was not tolerating this very well.  She would like to hold off until next visit.  We will provide an order to skilled nursing that they may remove sutures and staples we will follow-up with her in 3 weeks.  Follow-Up Instructions: No follow-ups on file.   Imaging: No results found.  Orders:  No orders of the defined types were placed in this encounter.  No orders of the defined types were placed in this encounter.    PMFS History: Patient Active Problem List   Diagnosis Date Noted   Surgical site infection    H/O necrotizing fasciitis    Cellulitis 01/16/2022   Cellulitis of right arm 01/15/2022   Macrocytic anemia 12/24/2021   Pressure injury of skin 12/22/2021   Fungemia    Acute respiratory failure with hypercapnia (HCC)    Gas gangrene of foot ([place])    Abscess of foot 12/17/2021   Abscess of right foot 12/17/2021   Necrotizing fasciitis ([place]) 12/17/2021   Malignant neoplasm metastatic to bone ([place]) 10/06/2021   Chest wall pain 08/19/2021   Acute right-sided thoracic back pain 08/12/2021   Intercostal muscle strain 04/14/2021   Rib fracture 03/12/2021   Osteoporosis 03/12/2021   Panniculitis  01/23/2021   Healthcare maintenance 01/23/2021   On supplemental oxygen therapy 11/19/2020   Insomnia 11/14/2020   Burn erythema of breast, initial encounter 11/05/2020   Skin ulcer ([place]) 06/24/2020   Pulmonary nodule 02/14/2020   Nodule of upper lobe of left lung 02/06/2020   Thyroid nodule 01/26/2020   Adenocarcinoma of lung ([place]) 01/04/2020   Chronic obstructive asthma ([place]) 12/29/2019   (HFpEF) heart failure with preserved ejection fraction ([place]) 02/07/2019   Acute on chronic heart failure with preserved ejection fraction (HFpEF) ([place]) 02/04/2019   Vitamin D deficiency 06/30/2018   Chronic anticoagulation 01/03/2018   Chronic respiratory failure with hypoxia ([place]) 11/30/2017   Atrial flutter ([place]) 03/11/2017   CAD (coronary artery disease) 01/28/2017   Obesity (BMI 30-39.9) 01/13/2017   Ascending aorta dilatation ([place]) 01/07/2017   Low back pain radiating to right lower extremity 12/02/2016   Acquired cyst of kidney 12/02/2016   Chronic kidney disease (CKD), stage III (moderate) (HCC) 12/01/2016   Generalized anxiety disorder 10/22/2016   Hypersomnia 10/01/2016   Accessory skin tags 06/10/2016   Nocturnal hypoxemia 05/13/2016   Cervical radiculopathy 04/14/2016   GERD (gastroesophageal reflux disease) 01/02/2016   Morbid (severe) obesity due to excess calories ([place]) 10/10/2015   Vitamin B12 deficiency 10/02/2015   HIV disease ([place]) 07/09/2015   Uncontrolled hypertension 07/09/2015   Past Medical History:  Diagnosis Date   Anemia    Anxiety    HX PANIC ATTACKS   Arthritis    "starting to; in my hands" (07/09/2015)   Asthma    Atrial fibrillation (HCC)    Atrial flutter, paroxysmal (HCC)    Bloated abdomen    CFS (chronic fatigue syndrome)    Chewing difficulty    Chronic asthma with acute exacerbation    "I have chronic asthma all the time; sometimes exacerbations" (07/09/2015)   Chronic lower back pain    COPD (chronic obstructive pulmonary disease) (HCC)     Cyst of right kidney    "3 of them; dx'd in ~ 01/2015"   Dyspnea    GERD (gastroesophageal reflux disease)    Heart murmur    History of blood transfusion    "related to my brain surgery I think"   History of pulmonary embolism 07/09/2015   History of radiation therapy 04/09/20-04/22/20   SBRT Left Lung, Dr. Gery Pray   History of radiation therapy    lumbar spine 10/08/2021-10/28/2021  Dr Gery Pray   HIV disease [facility])    Hyperlipidemia    Hypertension    Leg edema    Lipodystrophy    Mild CAD 2013   Multiple thyroid nodules    Osteopenia    Palpitations    Pneumonia 07/09/2015   Shingles    Vitamin B 12 deficiency    Vitamin D deficiency     Family History  Problem Relation Age of Onset   Asthma Mother    Heart failure Mother        cardiomyopathy   Thyroid disease Mother    Heart disease Mother    Obesity Mother    Hypertension Mother    Heart murmur Sister    Heart murmur Brother    Diabetes Sister    Thyroid disease Sister    Heart murmur Sister     Past Surgical History:  Procedure Laterality Date   ABDOMINAL HYSTERECTOMY     "robotic laparosopic"   AMPUTATION Right 12/26/2021   Procedure: AMPUTATION ABOVE KNEE;  Surgeon: Newt Minion, MD;  Location: [place];  Service: Orthopedics;  Laterality: Right;   BRAIN SURGERY  1974   "brain tumor; benign; on top of my brain; got a plate in there"   BRAIN SURGERY     age 43- -"Tumor pushing my skullout"   BRONCHIAL BIOPSY  02/13/2020   Procedure: BRONCHIAL BIOPSIES;  Surgeon: [name], MD;  Location: [place];  Service: Pulmonary;;   BRONCHIAL BRUSHINGS  02/13/2020   Procedure: BRONCHIAL BRUSHINGS;  Surgeon: [name], MD;  Location: [facility]COPY;  Service: Pulmonary;;   BRONCHIAL NEEDLE ASPIRATION BIOPSY  02/13/2020   Procedure: BRONCHIAL NEEDLE ASPIRATION BIOPSIES;  Surgeon: [name], MD;  Location: [place] [facility];  Service: Pulmonary;;   CARDIAC CATHETERIZATION     FIDUCIAL MARKER PLACEMENT   02/13/2020   Procedure: FIDUCIAL MARKER PLACEMENT;  Surgeon: [name], MD;  Location: [place];  Service: Pulmonary;;   I & D EXTREMITY Right 12/20/2021   Procedure: IRRIGATION AND DEBRIDEMENT RT. FOREARM;  Surgeon: Iran Planas, MD;  Location: [facility];  Service: Orthopedics;  Laterality: Right;   IRRIGATION AND DEBRIDEMENT FOOT Right 12/17/2021   Procedure: IRRIGATION AND DEBRIDEMENT, RIGHT FOOT AND CALF;  Surgeon: Armond Hang, MD;  Location: [facility];  Service: Orthopedics;  Laterality: Right;   TONSILLECTOMY AND ADENOIDECTOMY     VIDEO BRONCHOSCOPY WITH ENDOBRONCHIAL NAVIGATION N/A 02/13/2020  Procedure: VIDEO BRONCHOSCOPY WITH ENDOBRONCHIAL NAVIGATION;  Surgeon: [name], MD;  Location: [facility] END[facility]COPY;  Service: Pulmonary;  Laterality: N/A;   Social History   Occupational History   Occupation: student  Tobacco Use   Smoking status: Former    Types: Cigarettes   Smokeless tobacco: Never   Tobacco comments:    Pt states she smoked when she was in college many years ago.  Vaping Use   Vaping Use: Never used  Substance and Sexual Activity   Alcohol use: Not Currently    Alcohol/week: 0.0 standard drinks    Comment: Rarely.   Drug use: No   Sexual activity: Not Currently    Partners: Male    Comment: declined condoms

## 2022-01-28 ENCOUNTER — Other Ambulatory Visit: Payer: Self-pay

## 2022-01-28 ENCOUNTER — Other Ambulatory Visit: Payer: Self-pay | Admitting: *Deleted

## 2022-01-28 ENCOUNTER — Telehealth: Payer: Medicare Other

## 2022-01-28 DIAGNOSIS — C7951 Secondary malignant neoplasm of bone: Secondary | ICD-10-CM

## 2022-01-28 DIAGNOSIS — M545 Low back pain, unspecified: Secondary | ICD-10-CM | POA: Diagnosis not present

## 2022-01-28 DIAGNOSIS — Z89611 Acquired absence of right leg above knee: Secondary | ICD-10-CM | POA: Diagnosis not present

## 2022-01-28 DIAGNOSIS — L03113 Cellulitis of right upper limb: Secondary | ICD-10-CM | POA: Diagnosis not present

## 2022-01-28 DIAGNOSIS — G9341 Metabolic encephalopathy: Secondary | ICD-10-CM | POA: Diagnosis not present

## 2022-01-28 NOTE — Patient Outreach (Signed)
[facility] Post- Acute Care Coordinator follow up. Per [place] eligible member currently resides in [facility].    Member's PCP at [facility] has [place] care coordination team. Shannon [name] is active with [place] Coordinator at PCP office.   Facility site visit to [facility] skilled nursing facility. Met with Shannon [name], SNF SW concerning member's transition plan. Shannon [name] reports member's transition plan is uncertain at this time.   Spoke with Shannon [name] in her room at [facility]. Shannon [name] states she is "taking one day at a time" when asked about plans after rehab. Endorses that she has "been thru a lot as of late." States ultimately she wants to return home but is not totally against staying LTC. Shannon [name] lives alone. Her sister lives in [place]. She did not want to talk about transition plans at the time of writer's visit. Discussed Probation officer will keep [place] with [facility] updated. Provided Shannon [name] with writer's contact information.   Will continue to follow while Shannon [name] resides in SNF.    Marthenia Rolling, MSN, RN,BSN [place] Acute Care Coordinator [phone] [facility]) [phone]  (Toll free office)

## 2022-01-29 ENCOUNTER — Encounter: Payer: Self-pay | Admitting: *Deleted

## 2022-01-29 ENCOUNTER — Inpatient Hospital Stay: Payer: Medicare Other | Admitting: Nurse Practitioner

## 2022-01-29 ENCOUNTER — Inpatient Hospital Stay: Payer: Medicare Other | Attending: Internal Medicine | Admitting: Internal Medicine

## 2022-01-29 ENCOUNTER — Telehealth: Payer: Self-pay

## 2022-01-29 ENCOUNTER — Inpatient Hospital Stay: Payer: Medicare Other

## 2022-01-29 DIAGNOSIS — J449 Chronic obstructive pulmonary disease, unspecified: Secondary | ICD-10-CM | POA: Diagnosis not present

## 2022-01-29 DIAGNOSIS — J45909 Unspecified asthma, uncomplicated: Secondary | ICD-10-CM | POA: Diagnosis not present

## 2022-01-29 DIAGNOSIS — G4733 Obstructive sleep apnea (adult) (pediatric): Secondary | ICD-10-CM | POA: Diagnosis not present

## 2022-01-29 DIAGNOSIS — R911 Solitary pulmonary nodule: Secondary | ICD-10-CM | POA: Diagnosis not present

## 2022-01-29 DIAGNOSIS — R269 Unspecified abnormalities of gait and mobility: Secondary | ICD-10-CM | POA: Diagnosis not present

## 2022-01-29 NOTE — Telephone Encounter (Signed)
Pt missed appts this AM, called to check-in and pt unavailable, LVM and call back number

## 2022-01-29 NOTE — Progress Notes (Signed)
Oncology Nurse Navigator Documentation     01/29/2022   12:00 PM 12/08/2021   10:00 AM 12/04/2021   10:00 AM 12/02/2021    3:00 PM 11/27/2021   11:00 AM  Oncology Nurse Navigator Flowsheets  Navigator Follow Up Date:   12/23/2021 12/15/2021 12/03/2021  Navigator Follow Up Reason:   Follow-up Appointment Awaiting Molecular Testing Molecular Testing  Navigator Location [facility]  Navigator Encounter Type Telephone Telephone Telephone Molecular Studies Molecular Studies  Telephone Outgoing Call Outgoing Call Outgoing Call    Patient Visit Type  Other Other Other Other  Barriers/Navigation Needs Coordination of Care Coordination of Care;Education Coordination of Care Coordination of Care Coordination of Care  Education  Other     Interventions Coordination of Care/Patient missed her appt today with Dr. Julien Nordmann. I called [facility] when she is staying.  I asked the nurse taking care of her if she could have the transportation service call me so we can get her a follow up appt with Dr. Julien Nordmann. She said she would let her know.  Coordination of Care;Education Coordination of Care Coordination of Care Coordination of Care  Acuity Level 2-Minimal Needs (1-2 Barriers Identified) Level 2-Minimal Needs (1-2 Barriers Identified) Level 2-Minimal Needs (1-2 Barriers Identified) Level 2-Minimal Needs (1-2 Barriers Identified) Level 2-Minimal Needs (1-2 Barriers Identified)  Coordination of Care Other Appts Pathology Pathology Pathology  Education Method  Verbal     Time Spent with Patient 30 30 30 30  30

## 2022-01-30 DIAGNOSIS — S51801A Unspecified open wound of right forearm, initial encounter: Secondary | ICD-10-CM | POA: Diagnosis not present

## 2022-01-30 DIAGNOSIS — M6281 Muscle weakness (generalized): Secondary | ICD-10-CM | POA: Diagnosis not present

## 2022-01-30 DIAGNOSIS — Z86711 Personal history of pulmonary embolism: Secondary | ICD-10-CM | POA: Diagnosis not present

## 2022-01-30 DIAGNOSIS — Z89611 Acquired absence of right leg above knee: Secondary | ICD-10-CM | POA: Diagnosis not present

## 2022-01-30 DIAGNOSIS — C3490 Malignant neoplasm of unspecified part of unspecified bronchus or lung: Secondary | ICD-10-CM | POA: Diagnosis not present

## 2022-01-30 DIAGNOSIS — E118 Type 2 diabetes mellitus with unspecified complications: Secondary | ICD-10-CM | POA: Diagnosis not present

## 2022-01-30 DIAGNOSIS — M545 Low back pain, unspecified: Secondary | ICD-10-CM | POA: Diagnosis not present

## 2022-01-30 DIAGNOSIS — M726 Necrotizing fasciitis: Secondary | ICD-10-CM | POA: Diagnosis not present

## 2022-01-30 DIAGNOSIS — R600 Localized edema: Secondary | ICD-10-CM | POA: Diagnosis not present

## 2022-01-30 DIAGNOSIS — S5011XA Contusion of right forearm, initial encounter: Secondary | ICD-10-CM | POA: Diagnosis not present

## 2022-02-02 DIAGNOSIS — Z89611 Acquired absence of right leg above knee: Secondary | ICD-10-CM | POA: Diagnosis not present

## 2022-02-02 DIAGNOSIS — Z86711 Personal history of pulmonary embolism: Secondary | ICD-10-CM | POA: Diagnosis not present

## 2022-02-02 DIAGNOSIS — I4821 Permanent atrial fibrillation: Secondary | ICD-10-CM | POA: Diagnosis not present

## 2022-02-02 DIAGNOSIS — R4020 Unspecified coma: Secondary | ICD-10-CM | POA: Diagnosis not present

## 2022-02-02 DIAGNOSIS — M726 Necrotizing fasciitis: Secondary | ICD-10-CM | POA: Diagnosis not present

## 2022-02-02 DIAGNOSIS — E118 Type 2 diabetes mellitus with unspecified complications: Secondary | ICD-10-CM | POA: Diagnosis not present

## 2022-02-02 DIAGNOSIS — I469 Cardiac arrest, cause unspecified: Secondary | ICD-10-CM | POA: Diagnosis not present

## 2022-02-02 DIAGNOSIS — C3490 Malignant neoplasm of unspecified part of unspecified bronchus or lung: Secondary | ICD-10-CM | POA: Diagnosis not present

## 2022-02-02 DIAGNOSIS — M545 Low back pain, unspecified: Secondary | ICD-10-CM | POA: Diagnosis not present

## 2022-02-02 DIAGNOSIS — R404 Transient alteration of awareness: Secondary | ICD-10-CM | POA: Diagnosis not present

## 2022-02-02 DIAGNOSIS — I499 Cardiac arrhythmia, unspecified: Secondary | ICD-10-CM | POA: Diagnosis not present

## 2022-02-02 DIAGNOSIS — M6281 Muscle weakness (generalized): Secondary | ICD-10-CM | POA: Diagnosis not present

## 2022-02-02 DIAGNOSIS — Z743 Need for continuous supervision: Secondary | ICD-10-CM | POA: Diagnosis not present

## 2022-02-03 ENCOUNTER — Telehealth: Payer: Self-pay | Admitting: Medical Oncology

## 2022-02-03 NOTE — Telephone Encounter (Signed)
Sharyn Lull stated Shanine died yesterday morning.  She asked for a copy of [place]'s Advanced Directive. I told Sharyn Lull there is not one in the chart that I can find.

## 2022-02-05 ENCOUNTER — Telehealth: Payer: Medicare Other

## 2022-02-09 ENCOUNTER — Ambulatory Visit: Payer: Self-pay

## 2022-02-09 NOTE — Chronic Care Management (AMB) (Signed)
   02/09/2022  Jazelyn Sipe [place] [birth year] [id number]  Care coordination case closed as patient deceased. [name], RN, BSN, CCM Care Management Coordinator [facility] Phone: [phone]: [phone]

## 2022-02-13 ENCOUNTER — Encounter: Payer: Self-pay | Admitting: *Deleted

## 2022-02-17 ENCOUNTER — Encounter: Payer: Medicare Other | Admitting: Family

## 2022-02-21 DEATH — deceased

## 2022-03-02 ENCOUNTER — Ambulatory Visit: Payer: Medicare Other | Admitting: Internal Medicine

## 2022-03-12 ENCOUNTER — Other Ambulatory Visit: Payer: Self-pay | Admitting: Internal Medicine

## 2022-03-27 ENCOUNTER — Telehealth: Payer: Self-pay

## 2022-03-27 NOTE — Telephone Encounter (Signed)
Pt was to have follow up visit in office and did not show to last appt. Looking in the chart the pt has expired. Per Dr. Sharol Given will contact SNF to find cause of death needed for registry.

## 2022-04-01 ENCOUNTER — Encounter (INDEPENDENT_AMBULATORY_CARE_PROVIDER_SITE_OTHER): Payer: Self-pay

## 2022-04-01 ENCOUNTER — Telehealth: Payer: Self-pay

## 2022-04-01 NOTE — Telephone Encounter (Signed)
I have marked this in patients chart.

## 2022-04-01 NOTE — Telephone Encounter (Signed)
-----   Message from [name], [place] sent at 03/31/2022  3:42 PM EDT ----- Regarding: odd question. This pt is deceased. She passed on 2022/02/22. Her family states they are still getting calls from offices to schedule appt do you know how we flag a chart to advise a patient has expired?

## 2022-04-01 NOTE — Telephone Encounter (Signed)
I spoke with [facility] and they confirmed that the pt "coded" in the facility and passed away. Stated that the pt ws not in any distress moments before and that there was not any other information they had to give.  I called and sw pt's sister Francis Dowse and conveyed our deepest sympathy. I advised that as part of the registry for the surgery the pt had I was wanting to know if there was any information that she could provide as to the cause of death for the pt. She did not confirm if an autopsy had been done but did give details leading up to the event. The pt's sister states that the pt called her and advised that she had not been moved from her room nor her bed for three days in a row. She was very upset about this. The pt's sister advise that she would be at the facility the next morning. This conversation took place on Sunday 02/01/22 and the pt expired on 02/01/2022 before she could get to the facility. This information was submitted to registry.

## 2022-04-06 ENCOUNTER — Ambulatory Visit: Payer: Self-pay | Admitting: Radiation Oncology

## 2022-09-07 ENCOUNTER — Other Ambulatory Visit: Payer: Self-pay | Admitting: Physician Assistant

## 2023-09-13 NOTE — Telephone Encounter (Signed)
 error

## 2024-03-24 ENCOUNTER — Other Ambulatory Visit (HOSPITAL_COMMUNITY): Payer: Self-pay
